# Patient Record
Sex: Male | Born: 1960
Health system: Southern US, Community
[De-identification: ages and names within clinical notes are randomized; demographics above are authoritative.]

## PROBLEM LIST (undated history)

## (undated) DIAGNOSIS — R6521 Severe sepsis with septic shock: Secondary | ICD-10-CM

## (undated) DIAGNOSIS — J9601 Acute respiratory failure with hypoxia: Secondary | ICD-10-CM

## (undated) DIAGNOSIS — I420 Dilated cardiomyopathy: Secondary | ICD-10-CM

## (undated) DIAGNOSIS — Z7189 Other specified counseling: Secondary | ICD-10-CM

## (undated) DIAGNOSIS — J45909 Unspecified asthma, uncomplicated: Secondary | ICD-10-CM

## (undated) DIAGNOSIS — I4891 Unspecified atrial fibrillation: Secondary | ICD-10-CM

## (undated) DIAGNOSIS — I351 Nonrheumatic aortic (valve) insufficiency: Secondary | ICD-10-CM

## (undated) DIAGNOSIS — D696 Thrombocytopenia, unspecified: Secondary | ICD-10-CM

## (undated) DIAGNOSIS — K219 Gastro-esophageal reflux disease without esophagitis: Secondary | ICD-10-CM

## (undated) DIAGNOSIS — E785 Hyperlipidemia, unspecified: Secondary | ICD-10-CM

## (undated) DIAGNOSIS — E1169 Type 2 diabetes mellitus with other specified complication: Secondary | ICD-10-CM

## (undated) DIAGNOSIS — J90 Pleural effusion, not elsewhere classified: Secondary | ICD-10-CM

## (undated) DIAGNOSIS — I10 Essential (primary) hypertension: Secondary | ICD-10-CM

## (undated) DIAGNOSIS — I509 Heart failure, unspecified: Secondary | ICD-10-CM

## (undated) DIAGNOSIS — N184 Chronic kidney disease, stage 4 (severe): Secondary | ICD-10-CM

## (undated) DIAGNOSIS — Z992 Dependence on renal dialysis: Secondary | ICD-10-CM

## (undated) DIAGNOSIS — G9340 Encephalopathy, unspecified: Secondary | ICD-10-CM

## (undated) DIAGNOSIS — E669 Obesity, unspecified: Secondary | ICD-10-CM

## (undated) DIAGNOSIS — E8770 Fluid overload, unspecified: Secondary | ICD-10-CM

## (undated) DIAGNOSIS — Z953 Presence of xenogenic heart valve: Secondary | ICD-10-CM

## (undated) DIAGNOSIS — A084 Viral intestinal infection, unspecified: Secondary | ICD-10-CM

## (undated) DIAGNOSIS — I2510 Atherosclerotic heart disease of native coronary artery without angina pectoris: Secondary | ICD-10-CM

## (undated) DIAGNOSIS — I4821 Permanent atrial fibrillation: Secondary | ICD-10-CM

## (undated) DIAGNOSIS — R109 Unspecified abdominal pain: Secondary | ICD-10-CM

## (undated) DIAGNOSIS — D638 Anemia in other chronic diseases classified elsewhere: Secondary | ICD-10-CM

## (undated) DIAGNOSIS — G4733 Obstructive sleep apnea (adult) (pediatric): Secondary | ICD-10-CM

## (undated) DIAGNOSIS — K859 Acute pancreatitis without necrosis or infection, unspecified: Secondary | ICD-10-CM

## (undated) DIAGNOSIS — R7989 Other specified abnormal findings of blood chemistry: Secondary | ICD-10-CM

## (undated) DIAGNOSIS — E875 Hyperkalemia: Secondary | ICD-10-CM

## (undated) DIAGNOSIS — K7460 Unspecified cirrhosis of liver: Secondary | ICD-10-CM

## (undated) DIAGNOSIS — R0600 Dyspnea, unspecified: Secondary | ICD-10-CM

## (undated) DIAGNOSIS — D649 Anemia, unspecified: Secondary | ICD-10-CM

## (undated) DIAGNOSIS — M109 Gout, unspecified: Secondary | ICD-10-CM

## (undated) DIAGNOSIS — A419 Sepsis, unspecified organism: Secondary | ICD-10-CM

## (undated) DIAGNOSIS — Z79899 Other long term (current) drug therapy: Secondary | ICD-10-CM

## (undated) DIAGNOSIS — E119 Type 2 diabetes mellitus without complications: Secondary | ICD-10-CM

## (undated) DIAGNOSIS — Z9289 Personal history of other medical treatment: Secondary | ICD-10-CM

## (undated) DIAGNOSIS — R55 Syncope and collapse: Secondary | ICD-10-CM

## (undated) DIAGNOSIS — Z9889 Other specified postprocedural states: Secondary | ICD-10-CM

## (undated) DIAGNOSIS — M79669 Pain in unspecified lower leg: Secondary | ICD-10-CM

## (undated) DIAGNOSIS — I480 Paroxysmal atrial fibrillation: Secondary | ICD-10-CM

## (undated) DIAGNOSIS — N186 End stage renal disease: Secondary | ICD-10-CM

## (undated) DIAGNOSIS — I5022 Chronic systolic (congestive) heart failure: Secondary | ICD-10-CM

## (undated) DIAGNOSIS — I272 Pulmonary hypertension, unspecified: Secondary | ICD-10-CM

## (undated) DIAGNOSIS — I059 Rheumatic mitral valve disease, unspecified: Secondary | ICD-10-CM

## (undated) HISTORY — DX: Other specified counseling: Z71.89

## (undated) HISTORY — DX: Sepsis, unspecified organism: A41.9

## (undated) HISTORY — DX: Dilated cardiomyopathy: I42.0

## (undated) HISTORY — DX: Pain in unspecified lower leg: M79.669

## (undated) HISTORY — DX: Heart failure, unspecified: I50.9

## (undated) HISTORY — DX: Unspecified abdominal pain: R10.9

## (undated) HISTORY — DX: Fluid overload, unspecified: E87.70

## (undated) HISTORY — DX: Sepsis, unspecified organism: R65.21

## (undated) HISTORY — DX: Acute pancreatitis without necrosis or infection, unspecified: K85.90

## (undated) HISTORY — DX: Encephalopathy, unspecified: G93.40

## (undated) HISTORY — DX: Gout, unspecified: M10.9

## (undated) HISTORY — DX: Hyperlipidemia, unspecified: E78.5

## (undated) HISTORY — DX: Anemia, unspecified: D64.9

## (undated) HISTORY — DX: Permanent atrial fibrillation: I48.21

## (undated) HISTORY — DX: Rheumatic mitral valve disease, unspecified: I05.9

## (undated) HISTORY — DX: Gastro-esophageal reflux disease without esophagitis: K21.9

## (undated) HISTORY — DX: Dependence on renal dialysis: Z99.2

## (undated) HISTORY — DX: Nonrheumatic aortic (valve) insufficiency: I35.1

## (undated) HISTORY — DX: Hyperkalemia: E87.5

## (undated) HISTORY — DX: Other specified abnormal findings of blood chemistry: R79.89

## (undated) HISTORY — PX: CARDIAC SURGERY: SHX584

## (undated) HISTORY — DX: End stage renal disease: N18.6

## (undated) HISTORY — DX: Unspecified atrial fibrillation: I48.91

## (undated) HISTORY — DX: Personal history of other medical treatment: Z92.89

## (undated) HISTORY — DX: Acute respiratory failure with hypoxia: J96.01

## (undated) HISTORY — DX: Viral intestinal infection, unspecified: A08.4

## (undated) HISTORY — DX: Unspecified asthma, uncomplicated: J45.909

## (undated) HISTORY — DX: Chronic systolic (congestive) heart failure: I50.22

## (undated) HISTORY — DX: Other long term (current) drug therapy: Z79.899

## (undated) HISTORY — DX: Syncope and collapse: R55

## (undated) HISTORY — DX: Pleural effusion, not elsewhere classified: J90

---

## 1998-06-29 ENCOUNTER — Ambulatory Visit: Admission: RE | Admit: 1998-06-29 | Discharge: 1998-06-29 | Payer: Self-pay | Admitting: Otolaryngology

## 2009-11-17 HISTORY — PX: CARDIAC VALVE REPLACEMENT: SHX585

## 2010-11-17 DIAGNOSIS — Z953 Presence of xenogenic heart valve: Secondary | ICD-10-CM

## 2010-11-17 DIAGNOSIS — Z9889 Other specified postprocedural states: Secondary | ICD-10-CM

## 2010-11-17 HISTORY — DX: Presence of xenogenic heart valve: Z95.3

## 2010-11-17 HISTORY — DX: Other specified postprocedural states: Z98.890

## 2014-01-04 DIAGNOSIS — R7989 Other specified abnormal findings of blood chemistry: Secondary | ICD-10-CM | POA: Diagnosis not present

## 2014-01-04 DIAGNOSIS — N189 Chronic kidney disease, unspecified: Secondary | ICD-10-CM | POA: Diagnosis not present

## 2014-01-04 DIAGNOSIS — I4891 Unspecified atrial fibrillation: Secondary | ICD-10-CM | POA: Diagnosis not present

## 2014-01-04 DIAGNOSIS — IMO0001 Reserved for inherently not codable concepts without codable children: Secondary | ICD-10-CM | POA: Diagnosis not present

## 2014-01-18 DIAGNOSIS — M1990 Unspecified osteoarthritis, unspecified site: Secondary | ICD-10-CM | POA: Diagnosis not present

## 2014-01-18 DIAGNOSIS — N189 Chronic kidney disease, unspecified: Secondary | ICD-10-CM | POA: Diagnosis not present

## 2014-01-18 DIAGNOSIS — I4891 Unspecified atrial fibrillation: Secondary | ICD-10-CM | POA: Diagnosis not present

## 2014-01-18 DIAGNOSIS — IMO0001 Reserved for inherently not codable concepts without codable children: Secondary | ICD-10-CM | POA: Diagnosis not present

## 2014-01-19 DIAGNOSIS — H2510 Age-related nuclear cataract, unspecified eye: Secondary | ICD-10-CM | POA: Diagnosis not present

## 2014-01-19 DIAGNOSIS — E119 Type 2 diabetes mellitus without complications: Secondary | ICD-10-CM | POA: Diagnosis not present

## 2014-01-27 DIAGNOSIS — I4891 Unspecified atrial fibrillation: Secondary | ICD-10-CM | POA: Diagnosis not present

## 2014-01-27 DIAGNOSIS — Z954 Presence of other heart-valve replacement: Secondary | ICD-10-CM | POA: Diagnosis not present

## 2014-03-02 DIAGNOSIS — Z8679 Personal history of other diseases of the circulatory system: Secondary | ICD-10-CM | POA: Diagnosis not present

## 2014-03-23 DIAGNOSIS — I059 Rheumatic mitral valve disease, unspecified: Secondary | ICD-10-CM | POA: Diagnosis not present

## 2014-04-11 DIAGNOSIS — Z8679 Personal history of other diseases of the circulatory system: Secondary | ICD-10-CM | POA: Diagnosis not present

## 2014-04-11 DIAGNOSIS — Z7901 Long term (current) use of anticoagulants: Secondary | ICD-10-CM | POA: Diagnosis not present

## 2014-04-11 DIAGNOSIS — I509 Heart failure, unspecified: Secondary | ICD-10-CM | POA: Diagnosis not present

## 2014-04-13 DIAGNOSIS — E119 Type 2 diabetes mellitus without complications: Secondary | ICD-10-CM | POA: Diagnosis not present

## 2014-04-13 DIAGNOSIS — I10 Essential (primary) hypertension: Secondary | ICD-10-CM | POA: Diagnosis not present

## 2014-04-13 DIAGNOSIS — Z125 Encounter for screening for malignant neoplasm of prostate: Secondary | ICD-10-CM | POA: Diagnosis not present

## 2014-04-13 DIAGNOSIS — Z0000 Encounter for general adult medical examination without abnormal findings: Secondary | ICD-10-CM | POA: Diagnosis not present

## 2014-05-11 DIAGNOSIS — N189 Chronic kidney disease, unspecified: Secondary | ICD-10-CM | POA: Diagnosis not present

## 2014-05-11 DIAGNOSIS — IMO0001 Reserved for inherently not codable concepts without codable children: Secondary | ICD-10-CM | POA: Diagnosis not present

## 2014-07-28 DIAGNOSIS — E119 Type 2 diabetes mellitus without complications: Secondary | ICD-10-CM | POA: Diagnosis not present

## 2014-08-04 DIAGNOSIS — N189 Chronic kidney disease, unspecified: Secondary | ICD-10-CM | POA: Diagnosis not present

## 2014-08-04 DIAGNOSIS — I4891 Unspecified atrial fibrillation: Secondary | ICD-10-CM | POA: Diagnosis not present

## 2014-08-04 DIAGNOSIS — IMO0001 Reserved for inherently not codable concepts without codable children: Secondary | ICD-10-CM | POA: Diagnosis not present

## 2014-08-04 DIAGNOSIS — I10 Essential (primary) hypertension: Secondary | ICD-10-CM | POA: Diagnosis not present

## 2014-08-21 DIAGNOSIS — I10 Essential (primary) hypertension: Secondary | ICD-10-CM | POA: Diagnosis not present

## 2014-08-21 DIAGNOSIS — N181 Chronic kidney disease, stage 1: Secondary | ICD-10-CM | POA: Diagnosis not present

## 2014-08-21 DIAGNOSIS — Z8679 Personal history of other diseases of the circulatory system: Secondary | ICD-10-CM | POA: Diagnosis not present

## 2014-09-06 DIAGNOSIS — Z954 Presence of other heart-valve replacement: Secondary | ICD-10-CM | POA: Diagnosis not present

## 2014-09-06 DIAGNOSIS — I480 Paroxysmal atrial fibrillation: Secondary | ICD-10-CM | POA: Diagnosis not present

## 2014-09-06 DIAGNOSIS — I10 Essential (primary) hypertension: Secondary | ICD-10-CM | POA: Diagnosis not present

## 2014-09-06 DIAGNOSIS — I059 Rheumatic mitral valve disease, unspecified: Secondary | ICD-10-CM | POA: Diagnosis not present

## 2014-09-11 DIAGNOSIS — Z7901 Long term (current) use of anticoagulants: Secondary | ICD-10-CM | POA: Diagnosis not present

## 2014-09-11 DIAGNOSIS — E782 Mixed hyperlipidemia: Secondary | ICD-10-CM | POA: Diagnosis not present

## 2014-09-11 DIAGNOSIS — Z8679 Personal history of other diseases of the circulatory system: Secondary | ICD-10-CM | POA: Diagnosis not present

## 2014-09-11 DIAGNOSIS — E119 Type 2 diabetes mellitus without complications: Secondary | ICD-10-CM | POA: Diagnosis not present

## 2014-09-14 DIAGNOSIS — I480 Paroxysmal atrial fibrillation: Secondary | ICD-10-CM | POA: Diagnosis not present

## 2014-09-14 DIAGNOSIS — Z952 Presence of prosthetic heart valve: Secondary | ICD-10-CM | POA: Diagnosis not present

## 2014-09-14 DIAGNOSIS — I10 Essential (primary) hypertension: Secondary | ICD-10-CM | POA: Diagnosis not present

## 2014-10-23 DIAGNOSIS — Z7901 Long term (current) use of anticoagulants: Secondary | ICD-10-CM | POA: Diagnosis not present

## 2014-10-23 DIAGNOSIS — E782 Mixed hyperlipidemia: Secondary | ICD-10-CM | POA: Diagnosis not present

## 2014-10-23 DIAGNOSIS — E119 Type 2 diabetes mellitus without complications: Secondary | ICD-10-CM | POA: Diagnosis not present

## 2014-10-23 DIAGNOSIS — Z8679 Personal history of other diseases of the circulatory system: Secondary | ICD-10-CM | POA: Diagnosis not present

## 2014-10-31 DIAGNOSIS — I10 Essential (primary) hypertension: Secondary | ICD-10-CM | POA: Diagnosis not present

## 2014-10-31 DIAGNOSIS — Z8679 Personal history of other diseases of the circulatory system: Secondary | ICD-10-CM | POA: Diagnosis not present

## 2014-10-31 DIAGNOSIS — Z7901 Long term (current) use of anticoagulants: Secondary | ICD-10-CM | POA: Diagnosis not present

## 2014-10-31 DIAGNOSIS — N181 Chronic kidney disease, stage 1: Secondary | ICD-10-CM | POA: Diagnosis not present

## 2014-11-28 DIAGNOSIS — Z8679 Personal history of other diseases of the circulatory system: Secondary | ICD-10-CM | POA: Diagnosis not present

## 2014-11-28 DIAGNOSIS — N189 Chronic kidney disease, unspecified: Secondary | ICD-10-CM | POA: Diagnosis not present

## 2014-11-28 DIAGNOSIS — E119 Type 2 diabetes mellitus without complications: Secondary | ICD-10-CM | POA: Diagnosis not present

## 2014-12-08 DIAGNOSIS — R05 Cough: Secondary | ICD-10-CM | POA: Diagnosis not present

## 2014-12-08 DIAGNOSIS — J181 Lobar pneumonia, unspecified organism: Secondary | ICD-10-CM | POA: Diagnosis not present

## 2014-12-08 DIAGNOSIS — R509 Fever, unspecified: Secondary | ICD-10-CM | POA: Diagnosis not present

## 2014-12-08 DIAGNOSIS — R000 Tachycardia, unspecified: Secondary | ICD-10-CM | POA: Diagnosis not present

## 2014-12-08 DIAGNOSIS — I10 Essential (primary) hypertension: Secondary | ICD-10-CM | POA: Diagnosis not present

## 2014-12-08 DIAGNOSIS — J9811 Atelectasis: Secondary | ICD-10-CM | POA: Diagnosis not present

## 2014-12-08 DIAGNOSIS — R0602 Shortness of breath: Secondary | ICD-10-CM | POA: Diagnosis not present

## 2014-12-09 DIAGNOSIS — R0602 Shortness of breath: Secondary | ICD-10-CM | POA: Diagnosis not present

## 2014-12-09 DIAGNOSIS — R05 Cough: Secondary | ICD-10-CM | POA: Diagnosis not present

## 2014-12-10 DIAGNOSIS — I10 Essential (primary) hypertension: Secondary | ICD-10-CM | POA: Diagnosis not present

## 2014-12-10 DIAGNOSIS — I071 Rheumatic tricuspid insufficiency: Secondary | ICD-10-CM | POA: Diagnosis not present

## 2014-12-10 DIAGNOSIS — I5033 Acute on chronic diastolic (congestive) heart failure: Secondary | ICD-10-CM | POA: Diagnosis not present

## 2014-12-11 DIAGNOSIS — I509 Heart failure, unspecified: Secondary | ICD-10-CM | POA: Diagnosis not present

## 2014-12-11 DIAGNOSIS — R609 Edema, unspecified: Secondary | ICD-10-CM | POA: Diagnosis not present

## 2014-12-18 DIAGNOSIS — J440 Chronic obstructive pulmonary disease with acute lower respiratory infection: Secondary | ICD-10-CM | POA: Diagnosis not present

## 2014-12-20 DIAGNOSIS — I10 Essential (primary) hypertension: Secondary | ICD-10-CM | POA: Diagnosis not present

## 2014-12-20 DIAGNOSIS — E119 Type 2 diabetes mellitus without complications: Secondary | ICD-10-CM | POA: Diagnosis not present

## 2014-12-20 DIAGNOSIS — I270 Primary pulmonary hypertension: Secondary | ICD-10-CM | POA: Diagnosis not present

## 2015-01-05 DIAGNOSIS — Z954 Presence of other heart-valve replacement: Secondary | ICD-10-CM | POA: Diagnosis not present

## 2015-01-05 DIAGNOSIS — E119 Type 2 diabetes mellitus without complications: Secondary | ICD-10-CM | POA: Diagnosis not present

## 2015-01-05 DIAGNOSIS — I509 Heart failure, unspecified: Secondary | ICD-10-CM | POA: Diagnosis not present

## 2015-01-05 DIAGNOSIS — M5136 Other intervertebral disc degeneration, lumbar region: Secondary | ICD-10-CM | POA: Diagnosis not present

## 2015-02-02 DIAGNOSIS — D649 Anemia, unspecified: Secondary | ICD-10-CM | POA: Diagnosis not present

## 2015-02-02 DIAGNOSIS — R7989 Other specified abnormal findings of blood chemistry: Secondary | ICD-10-CM | POA: Diagnosis not present

## 2015-02-02 DIAGNOSIS — E119 Type 2 diabetes mellitus without complications: Secondary | ICD-10-CM | POA: Diagnosis not present

## 2015-02-02 DIAGNOSIS — M545 Low back pain: Secondary | ICD-10-CM | POA: Diagnosis not present

## 2015-03-14 DIAGNOSIS — N181 Chronic kidney disease, stage 1: Secondary | ICD-10-CM | POA: Diagnosis not present

## 2015-03-14 DIAGNOSIS — I10 Essential (primary) hypertension: Secondary | ICD-10-CM | POA: Diagnosis not present

## 2015-03-14 DIAGNOSIS — E119 Type 2 diabetes mellitus without complications: Secondary | ICD-10-CM | POA: Diagnosis not present

## 2015-04-11 DIAGNOSIS — D649 Anemia, unspecified: Secondary | ICD-10-CM | POA: Diagnosis not present

## 2015-04-11 DIAGNOSIS — Z7901 Long term (current) use of anticoagulants: Secondary | ICD-10-CM | POA: Diagnosis not present

## 2015-04-11 DIAGNOSIS — I10 Essential (primary) hypertension: Secondary | ICD-10-CM | POA: Diagnosis not present

## 2015-04-11 DIAGNOSIS — E785 Hyperlipidemia, unspecified: Secondary | ICD-10-CM | POA: Diagnosis not present

## 2015-04-11 DIAGNOSIS — E119 Type 2 diabetes mellitus without complications: Secondary | ICD-10-CM | POA: Diagnosis not present

## 2015-04-11 DIAGNOSIS — E559 Vitamin D deficiency, unspecified: Secondary | ICD-10-CM | POA: Diagnosis not present

## 2015-04-20 DIAGNOSIS — I10 Essential (primary) hypertension: Secondary | ICD-10-CM | POA: Diagnosis not present

## 2015-04-20 DIAGNOSIS — Z7901 Long term (current) use of anticoagulants: Secondary | ICD-10-CM | POA: Diagnosis not present

## 2015-04-20 DIAGNOSIS — E119 Type 2 diabetes mellitus without complications: Secondary | ICD-10-CM | POA: Diagnosis not present

## 2015-05-07 DIAGNOSIS — Z952 Presence of prosthetic heart valve: Secondary | ICD-10-CM | POA: Diagnosis not present

## 2015-05-07 DIAGNOSIS — Z794 Long term (current) use of insulin: Secondary | ICD-10-CM | POA: Diagnosis not present

## 2015-05-07 DIAGNOSIS — D689 Coagulation defect, unspecified: Secondary | ICD-10-CM | POA: Diagnosis not present

## 2015-05-07 DIAGNOSIS — E860 Dehydration: Secondary | ICD-10-CM | POA: Diagnosis not present

## 2015-05-07 DIAGNOSIS — E782 Mixed hyperlipidemia: Secondary | ICD-10-CM | POA: Diagnosis not present

## 2015-05-07 DIAGNOSIS — Z79899 Other long term (current) drug therapy: Secondary | ICD-10-CM | POA: Diagnosis not present

## 2015-05-07 DIAGNOSIS — J449 Chronic obstructive pulmonary disease, unspecified: Secondary | ICD-10-CM | POA: Diagnosis not present

## 2015-05-07 DIAGNOSIS — R079 Chest pain, unspecified: Secondary | ICD-10-CM | POA: Diagnosis not present

## 2015-05-07 DIAGNOSIS — I340 Nonrheumatic mitral (valve) insufficiency: Secondary | ICD-10-CM | POA: Diagnosis not present

## 2015-05-07 DIAGNOSIS — M109 Gout, unspecified: Secondary | ICD-10-CM | POA: Diagnosis not present

## 2015-05-07 DIAGNOSIS — I809 Phlebitis and thrombophlebitis of unspecified site: Secondary | ICD-10-CM | POA: Diagnosis not present

## 2015-05-07 DIAGNOSIS — I480 Paroxysmal atrial fibrillation: Secondary | ICD-10-CM | POA: Diagnosis not present

## 2015-05-07 DIAGNOSIS — E119 Type 2 diabetes mellitus without complications: Secondary | ICD-10-CM | POA: Diagnosis not present

## 2015-05-07 DIAGNOSIS — Z7901 Long term (current) use of anticoagulants: Secondary | ICD-10-CM | POA: Diagnosis not present

## 2015-05-07 DIAGNOSIS — G4731 Primary central sleep apnea: Secondary | ICD-10-CM | POA: Diagnosis not present

## 2015-05-07 DIAGNOSIS — M25532 Pain in left wrist: Secondary | ICD-10-CM | POA: Diagnosis not present

## 2015-05-07 DIAGNOSIS — N189 Chronic kidney disease, unspecified: Secondary | ICD-10-CM | POA: Diagnosis not present

## 2015-05-07 DIAGNOSIS — I129 Hypertensive chronic kidney disease with stage 1 through stage 4 chronic kidney disease, or unspecified chronic kidney disease: Secondary | ICD-10-CM | POA: Diagnosis not present

## 2015-05-07 DIAGNOSIS — G8929 Other chronic pain: Secondary | ICD-10-CM | POA: Diagnosis not present

## 2015-05-08 DIAGNOSIS — R079 Chest pain, unspecified: Secondary | ICD-10-CM | POA: Diagnosis not present

## 2015-05-18 DIAGNOSIS — M25532 Pain in left wrist: Secondary | ICD-10-CM | POA: Diagnosis not present

## 2015-06-23 DIAGNOSIS — Z23 Encounter for immunization: Secondary | ICD-10-CM | POA: Diagnosis not present

## 2015-06-29 DIAGNOSIS — M79641 Pain in right hand: Secondary | ICD-10-CM | POA: Diagnosis not present

## 2015-06-29 DIAGNOSIS — D649 Anemia, unspecified: Secondary | ICD-10-CM | POA: Diagnosis not present

## 2015-06-29 DIAGNOSIS — E119 Type 2 diabetes mellitus without complications: Secondary | ICD-10-CM | POA: Diagnosis not present

## 2015-06-29 DIAGNOSIS — Z7901 Long term (current) use of anticoagulants: Secondary | ICD-10-CM | POA: Diagnosis not present

## 2015-08-22 DIAGNOSIS — E119 Type 2 diabetes mellitus without complications: Secondary | ICD-10-CM | POA: Diagnosis not present

## 2015-08-22 DIAGNOSIS — M25579 Pain in unspecified ankle and joints of unspecified foot: Secondary | ICD-10-CM | POA: Diagnosis not present

## 2015-08-22 DIAGNOSIS — I10 Essential (primary) hypertension: Secondary | ICD-10-CM | POA: Diagnosis not present

## 2015-08-22 DIAGNOSIS — R5382 Chronic fatigue, unspecified: Secondary | ICD-10-CM | POA: Diagnosis not present

## 2015-08-22 DIAGNOSIS — E785 Hyperlipidemia, unspecified: Secondary | ICD-10-CM | POA: Diagnosis not present

## 2015-08-22 DIAGNOSIS — I499 Cardiac arrhythmia, unspecified: Secondary | ICD-10-CM | POA: Diagnosis not present

## 2015-08-22 DIAGNOSIS — E669 Obesity, unspecified: Secondary | ICD-10-CM | POA: Diagnosis not present

## 2015-08-27 ENCOUNTER — Telehealth: Payer: Self-pay | Admitting: Cardiovascular Disease

## 2015-08-27 NOTE — Telephone Encounter (Signed)
Received records from [facility] for appointment on 09/11/15 with Dr Oval Linsey.  Records given to [facility] (medical records) for Dr Randolm Idol schedule on 09/11/15. lp

## 2015-09-10 ENCOUNTER — Inpatient Hospital Stay (HOSPITAL_COMMUNITY)
Admission: EM | Admit: 2015-09-10 | Discharge: 2015-09-14 | DRG: 308 | Disposition: A | Discharge status: Home / Self Care | Payer: Medicare Other | Attending: Internal Medicine | Admitting: Internal Medicine

## 2015-09-10 ENCOUNTER — Encounter (HOSPITAL_COMMUNITY): Payer: Self-pay | Admitting: *Deleted

## 2015-09-10 ENCOUNTER — Emergency Department (HOSPITAL_COMMUNITY): Payer: Medicare Other

## 2015-09-10 DIAGNOSIS — Z6837 Body mass index (BMI) 37.0-37.9, adult: Secondary | ICD-10-CM

## 2015-09-10 DIAGNOSIS — D509 Iron deficiency anemia, unspecified: Secondary | ICD-10-CM | POA: Diagnosis present

## 2015-09-10 DIAGNOSIS — N189 Chronic kidney disease, unspecified: Secondary | ICD-10-CM

## 2015-09-10 DIAGNOSIS — I959 Hypotension, unspecified: Secondary | ICD-10-CM | POA: Diagnosis present

## 2015-09-10 DIAGNOSIS — G4733 Obstructive sleep apnea (adult) (pediatric): Secondary | ICD-10-CM | POA: Diagnosis present

## 2015-09-10 DIAGNOSIS — R1900 Intra-abdominal and pelvic swelling, mass and lump, unspecified site: Secondary | ICD-10-CM | POA: Diagnosis present

## 2015-09-10 DIAGNOSIS — R0600 Dyspnea, unspecified: Secondary | ICD-10-CM

## 2015-09-10 DIAGNOSIS — I481 Persistent atrial fibrillation: Secondary | ICD-10-CM | POA: Diagnosis not present

## 2015-09-10 DIAGNOSIS — M79672 Pain in left foot: Secondary | ICD-10-CM | POA: Diagnosis present

## 2015-09-10 DIAGNOSIS — E1122 Type 2 diabetes mellitus with diabetic chronic kidney disease: Secondary | ICD-10-CM

## 2015-09-10 DIAGNOSIS — I272 Other secondary pulmonary hypertension: Secondary | ICD-10-CM | POA: Diagnosis present

## 2015-09-10 DIAGNOSIS — I4891 Unspecified atrial fibrillation: Secondary | ICD-10-CM | POA: Diagnosis not present

## 2015-09-10 DIAGNOSIS — I5043 Acute on chronic combined systolic (congestive) and diastolic (congestive) heart failure: Secondary | ICD-10-CM | POA: Insufficient documentation

## 2015-09-10 DIAGNOSIS — I10 Essential (primary) hypertension: Secondary | ICD-10-CM

## 2015-09-10 DIAGNOSIS — I129 Hypertensive chronic kidney disease with stage 1 through stage 4 chronic kidney disease, or unspecified chronic kidney disease: Secondary | ICD-10-CM | POA: Diagnosis present

## 2015-09-10 DIAGNOSIS — Z951 Presence of aortocoronary bypass graft: Secondary | ICD-10-CM

## 2015-09-10 DIAGNOSIS — R0602 Shortness of breath: Secondary | ICD-10-CM

## 2015-09-10 DIAGNOSIS — F17200 Nicotine dependence, unspecified, uncomplicated: Secondary | ICD-10-CM | POA: Diagnosis present

## 2015-09-10 DIAGNOSIS — M79662 Pain in left lower leg: Secondary | ICD-10-CM

## 2015-09-10 DIAGNOSIS — E119 Type 2 diabetes mellitus without complications: Secondary | ICD-10-CM | POA: Diagnosis not present

## 2015-09-10 DIAGNOSIS — I5023 Acute on chronic systolic (congestive) heart failure: Secondary | ICD-10-CM | POA: Diagnosis not present

## 2015-09-10 DIAGNOSIS — E1165 Type 2 diabetes mellitus with hyperglycemia: Secondary | ICD-10-CM | POA: Diagnosis present

## 2015-09-10 DIAGNOSIS — I2510 Atherosclerotic heart disease of native coronary artery without angina pectoris: Secondary | ICD-10-CM | POA: Diagnosis present

## 2015-09-10 DIAGNOSIS — Z79899 Other long term (current) drug therapy: Secondary | ICD-10-CM | POA: Diagnosis not present

## 2015-09-10 DIAGNOSIS — E1129 Type 2 diabetes mellitus with other diabetic kidney complication: Secondary | ICD-10-CM

## 2015-09-10 DIAGNOSIS — Z952 Presence of prosthetic heart valve: Secondary | ICD-10-CM | POA: Diagnosis not present

## 2015-09-10 DIAGNOSIS — D638 Anemia in other chronic diseases classified elsewhere: Secondary | ICD-10-CM | POA: Diagnosis present

## 2015-09-10 DIAGNOSIS — M79669 Pain in unspecified lower leg: Secondary | ICD-10-CM | POA: Diagnosis not present

## 2015-09-10 DIAGNOSIS — Z9119 Patient's noncompliance with other medical treatment and regimen: Secondary | ICD-10-CM

## 2015-09-10 DIAGNOSIS — Z794 Long term (current) use of insulin: Secondary | ICD-10-CM

## 2015-09-10 DIAGNOSIS — N184 Chronic kidney disease, stage 4 (severe): Secondary | ICD-10-CM | POA: Diagnosis not present

## 2015-09-10 DIAGNOSIS — I059 Rheumatic mitral valve disease, unspecified: Secondary | ICD-10-CM | POA: Diagnosis not present

## 2015-09-10 DIAGNOSIS — E669 Obesity, unspecified: Secondary | ICD-10-CM | POA: Diagnosis present

## 2015-09-10 DIAGNOSIS — R0789 Other chest pain: Secondary | ICD-10-CM | POA: Diagnosis not present

## 2015-09-10 HISTORY — DX: Atherosclerotic heart disease of native coronary artery without angina pectoris: I25.10

## 2015-09-10 HISTORY — DX: Paroxysmal atrial fibrillation: I48.0

## 2015-09-10 HISTORY — DX: Pulmonary hypertension, unspecified: I27.20

## 2015-09-10 HISTORY — DX: Pain in unspecified lower leg: M79.669

## 2015-09-10 HISTORY — DX: Other specified postprocedural states: Z98.890

## 2015-09-10 HISTORY — DX: Essential (primary) hypertension: I10

## 2015-09-10 HISTORY — DX: Type 2 diabetes mellitus with other specified complication: E11.69

## 2015-09-10 HISTORY — DX: Obesity, unspecified: E66.9

## 2015-09-10 HISTORY — DX: Presence of xenogenic heart valve: Z95.3

## 2015-09-10 HISTORY — DX: Type 2 diabetes mellitus without complications: E11.9

## 2015-09-10 HISTORY — DX: Anemia in other chronic diseases classified elsewhere: D63.8

## 2015-09-10 HISTORY — DX: Chronic kidney disease, stage 4 (severe): N18.4

## 2015-09-10 HISTORY — DX: Obstructive sleep apnea (adult) (pediatric): G47.33

## 2015-09-10 LAB — CBC
HCT: 31.8 % — ABNORMAL LOW (ref 39.0–52.0)
HEMOGLOBIN: 10.4 g/dL — AB (ref 13.0–17.0)
MCH: 24.2 pg — ABNORMAL LOW (ref 26.0–34.0)
MCHC: 32.7 g/dL (ref 30.0–36.0)
MCV: 74.1 fL — ABNORMAL LOW (ref 78.0–100.0)
Platelets: 203 10*3/uL (ref 150–400)
RBC: 4.29 MIL/uL (ref 4.22–5.81)
RDW: 16.2 % — AB (ref 11.5–15.5)
WBC: 8 10*3/uL (ref 4.0–10.5)

## 2015-09-10 LAB — I-STAT TROPONIN, ED: TROPONIN I, POC: 0.01 ng/mL (ref 0.00–0.08)

## 2015-09-10 LAB — BASIC METABOLIC PANEL
ANION GAP: 8 (ref 5–15)
BUN: 67 mg/dL — AB (ref 6–20)
CALCIUM: 8.9 mg/dL (ref 8.9–10.3)
CHLORIDE: 108 mmol/L (ref 101–111)
CO2: 21 mmol/L — ABNORMAL LOW (ref 22–32)
Creatinine, Ser: 3.38 mg/dL — ABNORMAL HIGH (ref 0.61–1.24)
GFR, EST AFRICAN AMERICAN: 22 mL/min — AB (ref >60)
GFR, EST NON AFRICAN AMERICAN: 19 mL/min — AB (ref >60)
GLUCOSE: 183 mg/dL — AB (ref 65–99)
POTASSIUM: 5.1 mmol/L (ref 3.5–5.1)
SODIUM: 137 mmol/L (ref 135–145)

## 2015-09-10 LAB — GLUCOSE, CAPILLARY
GLUCOSE-CAPILLARY: 149 mg/dL — AB (ref 65–99)
GLUCOSE-CAPILLARY: 93 mg/dL (ref 65–99)

## 2015-09-10 LAB — D-DIMER, QUANTITATIVE (NOT AT ARMC): D DIMER QUANT: 0.99 ug{FEU}/mL — AB (ref 0.00–0.48)

## 2015-09-10 MED ORDER — LISINOPRIL 10 MG PO TABS
10.0000 mg | ORAL_TABLET | Freq: Every day | ORAL | Status: DC
  Filled 2015-09-10: qty 1

## 2015-09-10 MED ORDER — FUROSEMIDE 40 MG PO TABS
40.0000 mg | ORAL_TABLET | Freq: Every day | ORAL | Status: DC
  Administered 2015-09-12 – 2015-09-13 (×2): 40 mg via ORAL
  Filled 2015-09-10 (×3): qty 1

## 2015-09-10 MED ORDER — HEPARIN BOLUS VIA INFUSION
3000.0000 [IU] | Freq: Once | INTRAVENOUS | Status: AC
  Administered 2015-09-10: 3000 [IU] via INTRAVENOUS
  Filled 2015-09-10: qty 3000

## 2015-09-10 MED ORDER — DILTIAZEM HCL 100 MG IV SOLR: 5.0000 mg/h | INTRAVENOUS | Status: DC

## 2015-09-10 MED ORDER — CARVEDILOL 25 MG PO TABS
25.0000 mg | ORAL_TABLET | Freq: Two times a day (BID) | ORAL | Status: DC
  Administered 2015-09-11 – 2015-09-12 (×3): 25 mg via ORAL
  Filled 2015-09-10 (×3): qty 1

## 2015-09-10 MED ORDER — SODIUM CHLORIDE 0.9 % IJ SOLN
3.0000 mL | Freq: Two times a day (BID) | INTRAMUSCULAR | Status: DC
  Administered 2015-09-10 – 2015-09-14 (×5): 3 mL via INTRAVENOUS

## 2015-09-10 MED ORDER — DILTIAZEM LOAD VIA INFUSION
15.0000 mg | Freq: Once | INTRAVENOUS | Status: AC
  Administered 2015-09-10: 15 mg via INTRAVENOUS
  Filled 2015-09-10: qty 15

## 2015-09-10 MED ORDER — ACETAMINOPHEN 325 MG PO TABS
650.0000 mg | ORAL_TABLET | ORAL | Status: DC | PRN
  Administered 2015-09-10 – 2015-09-14 (×2): 650 mg via ORAL
  Filled 2015-09-10 (×2): qty 2

## 2015-09-10 MED ORDER — ONDANSETRON HCL 4 MG/2ML IJ SOLN: 4.0000 mg | Freq: Four times a day (QID) | INTRAMUSCULAR | Status: DC | PRN

## 2015-09-10 MED ORDER — SODIUM CHLORIDE 0.9 % IV SOLN
INTRAVENOUS | Status: DC
  Administered 2015-09-10: 1 mL via INTRAVENOUS

## 2015-09-10 MED ORDER — SODIUM CHLORIDE 0.9 % IV SOLN: 250.0000 mL | INTRAVENOUS | Status: DC | PRN

## 2015-09-10 MED ORDER — SODIUM CHLORIDE 0.9 % IJ SOLN: 3.0000 mL | INTRAMUSCULAR | Status: DC | PRN

## 2015-09-10 MED ORDER — INSULIN GLARGINE 100 UNIT/ML ~~LOC~~ SOLN
20.0000 [IU] | Freq: Every day | SUBCUTANEOUS | Status: DC
  Administered 2015-09-10 – 2015-09-13 (×4): 20 [IU] via SUBCUTANEOUS
  Filled 2015-09-10 (×5): qty 0.2

## 2015-09-10 MED ORDER — INSULIN ASPART 100 UNIT/ML ~~LOC~~ SOLN
0.0000 [IU] | Freq: Three times a day (TID) | SUBCUTANEOUS | Status: DC
Start: 2015-09-11 — End: 2015-09-14
  Administered 2015-09-11: 1 [IU] via SUBCUTANEOUS
  Administered 2015-09-11 (×2): 2 [IU] via SUBCUTANEOUS
  Administered 2015-09-12 (×2): 1 [IU] via SUBCUTANEOUS
  Administered 2015-09-13 (×2): 2 [IU] via SUBCUTANEOUS
  Administered 2015-09-14: 1 [IU] via SUBCUTANEOUS
  Administered 2015-09-14: 2 [IU] via SUBCUTANEOUS

## 2015-09-10 MED ORDER — DILTIAZEM LOAD VIA INFUSION: 10.0000 mg | Freq: Once | INTRAVENOUS | Status: DC

## 2015-09-10 MED ORDER — INSULIN ASPART 100 UNIT/ML ~~LOC~~ SOLN: 0.0000 [IU] | Freq: Every day | SUBCUTANEOUS | Status: DC

## 2015-09-10 MED ORDER — HEPARIN (PORCINE) IN NACL 100-0.45 UNIT/ML-% IJ SOLN
1350.0000 [IU]/h | INTRAMUSCULAR | Status: DC
  Administered 2015-09-10: 1350 [IU]/h via INTRAVENOUS
  Filled 2015-09-10 (×2): qty 250

## 2015-09-10 MED ORDER — DEXTROSE 5 % IV SOLN
5.0000 mg/h | INTRAVENOUS | Status: DC
  Administered 2015-09-10 – 2015-09-11 (×2): 5 mg/h via INTRAVENOUS
  Filled 2015-09-10 (×2): qty 100

## 2015-09-10 NOTE — Consult Note (Signed)
ANTICOAGULATION CONSULT NOTE - Initial Consult  Pharmacy Consult for Heparin Indication: atrial fibrillation  No Known Allergies  Patient Measurements: Weight: 212 lb 2 oz (96.219 kg)   Vital Signs: Temp: 97.9 F (36.6 C) (10/24 1658) Temp Source: Oral (10/24 1658) BP: 124/87 mmHg (10/24 2115) Pulse Rate: 59 (10/24 2115)  Labs:  Recent Labs  09/10/15 1727  HGB 10.4*  HCT 31.8*  PLT 203  CREATININE 3.38*    CrCl cannot be calculated (Unknown ideal weight.).   Medical History: Past Medical History  Diagnosis Date  . Coronary artery disease   . Diabetes mellitus without complication ([place])   . Hypertension   . CHF (congestive heart failure) (HCC)     Medications:  No anticoagulants pta  Assessment: 54yom presents to the ED with dyspnea x 2 days. He was found to be in afib ? new onset. CHADSVASC is at least 2. He will begin IV heparin. He is also in renal failure which may be chronic per nephrology.  Goal of Therapy:  Heparin level 0.3-0.7 units/ml Monitor platelets by anticoagulation protocol: Yes   Plan:  1) Heparin bolus 3000 units x 1 2) Heparin drip at 1350 units/hr 3) Check 8 hour heparin level 4) Daily heparin level and CBC  Deboraha Sprang 09/10/2015,9:56 PM

## 2015-09-10 NOTE — ED Notes (Signed)
Family at bedside and updated

## 2015-09-10 NOTE — ED Notes (Signed)
Pt from home for eval of sob since yesterday, pt also reports palpitations, and chest tightness. Pt denies any hx of afib, afib on the monitor currently at this time. Pt alert and oriented, skin warm and dry. Lung sounds clear. MD at bedside.

## 2015-09-10 NOTE — H&P (Deleted)
[facility] History and Physical  Michael [name] [id number] DOB: 1961-10-28 DOA: 09/10/2015  Referring physician: Dr Michael [name] PCP: No PCP Per Patient   Chief Complaint: Chest pain  HPI: Michael [name] is a 54 y.o. male with hx of CAD/ CABG, DM2 diet controlled, HTN , CHF who presented to ED today with SOB for hte last 2 days, felt cold yesterday.  Has productive cough, pain left calf w some swelling. Increased abdominal swelling to abdomen and legs, cannot breathe lying down.  In ED is in afib with RVR 115, normal BP's, normal SaO2 and creat 3.3.  No distress.   Family interprets, he is Michael [name].  Daughter doesn't know his medical history but has some papers at home and will bring them in tomorrow. She knows he's had bypass surg and a pig valve in 2011 in [place].  Pt lives in [place] and just moved to [place] for permanent residence. She says he has been seeing doctors every month or so. She said one of her uncles told her mother that Mr Michael [name] has "kidney problems" but she has never directly heard of that. She believes she has heard something about atrial fibrillation in his past as well. He is not on any anticoagulation, he is on Coreg.  He takes insulin 20 units daily which didn't make the home med list.     Chart revie  ROS  deniea HA, blurred vision, focal weakness  no n/v/d, no abd pain  no CP  +orthopnea  no cough or wheezing  no joitn pain or skin rash  +L calf pain  Where does patient live just moved to town living w daughter Can patient participate in ADLs? yes  Past Medical History  Past Medical History  Diagnosis Date  . Coronary artery disease   . Diabetes mellitus without complication ([place])   . Hypertension   . CHF (congestive heart failure) [facility])    Past Surgical History  Past Surgical History  Procedure Laterality Date  . Cardiac surgery     Family History No family history on file.  Social History  reports that he has been  smoking.  He does not have any smokeless tobacco history on file. He reports that he does not drink alcohol or use illicit drugs. Allergies No Known Allergies Home medications Prior to Admission medications   Medication Sig Start Date End Date Taking? Authorizing Provider  carvedilol (COREG) 25 MG tablet Take 25 mg by mouth 2 (two) times daily with a meal.   Yes Historical Provider, MD  ergocalciferol (VITAMIN D2) 50000 UNITS capsule Take 50,000 Units by mouth once a week. Take on Saturdays   Yes Historical Provider, MD  furosemide (LASIX) 40 MG tablet Take 40 mg by mouth daily.   Yes Historical Provider, MD  lisinopril (PRINIVIL,ZESTRIL) 10 MG tablet Take 10 mg by mouth daily.   Yes Historical Provider, MD  potassium chloride SA (K-DUR,KLOR-CON) 20 MEQ tablet Take 20 mEq by mouth 2 (two) times daily.   Yes Historical Provider, MD   Liver Function Tests No results for input(s): AST, ALT, ALKPHOS, BILITOT, PROT, ALBUMIN in the last 168 hours. No results for input(s): LIPASE, AMYLASE in the last 168 hours. CBC  Recent Labs Lab 09/10/15 1727  WBC 8.0  HGB 10.4*  HCT 31.8*  MCV 74.1*  PLT 123456   Basic Metabolic Panel  Recent Labs Lab 09/10/15 1727  NA 137  K 5.1  CL 108  CO2 21*  GLUCOSE 183*  BUN 67*  CREATININE 3.38*  CALCIUM 8.9    Filed Vitals:   09/10/15 1830 09/10/15 1915 09/10/15 2000 09/10/15 2030  BP: 112/68 115/92 113/96 111/88  Pulse: 62 95 102 83  Temp:      TempSrc:      Resp: 15 32 18 18  Weight:      SpO2: 99% 98% 98% 99%   Exam: Alert, no distress, calm  SaO2 98% on RA No rash, cyanosis or gangrene Sclera anicteric, throat clear No jvd  Chest clear to bases bilat, no rales or wheezing RRR no MRG, +sternotomy scar Abd soft ntnd no mass, ascites, no hsm GU normal male LE's discoloration above the ankles, chronic, no cords or edema, no erythema 2+ pulses in the feet Neuro moves all extremities , nf, ox 3  Home meds > carvedilol, lasix 40/d,  lisinopril, KCL, vit D2 EKG (independently reviewed) > afib with RVR 118, ST-T changes lateral leads, question lateral ischemia CXR (independently reviewed) > borderline CM , no acute changes Na 137  K 5.1  CO2 21  BUN 67   Creat 3.38   Glu 183    Trop 0.01  WBC 8  Hb 10.4  Plt 203    Assessment: 1. Atrial fib with RVR - suspect this is chronic but not sure, records are pending from pt's PCP. Not on coumadin, is on coreg though. Admit, tele, IV dilt gtt, cards consult in am, start him on IV heparin.   2. Dyspnea/ orthopnea - he has no vol excess , clear lungs and normal SaO2.  May be feeling effects of afib/ RVR.  Get d-dimer (calf pain), consider V/Q.  Start IV heparin as will need it for afib anyways.  3. Renal failure - no records but likely chronic according to hist from daughter.  No vol excess, on ACEi and lasix 4. L calf pain - normal on exam, dopplers ordered 5. DM on insulin 6. HTN on acei/ BB/ lasix 7. Social - is relocating to this area, family to bring in records tomorrow from PCP  Plan - as above   DVT Prophylaxis will be on IV hep  Code Status: full  Family Communication: at bedside  Disposition Plan: home when stable    [facility] Pager [phone]  Cell [phone]  If 7PM-7AM, please contact night-coverage [email or web address] Password [email or web address] 09/10/2015, 8:55 PM

## 2015-09-10 NOTE — ED Notes (Signed)
Pt is here with shortness of breath for 2 days, felt cold yesterday.  Pt reports productive cough. Pt reports pain to left calf, pulse palpable, slight swelling. Pt has increased swelling to abdomen and legs and states cannot breath when lays down to sleep.

## 2015-09-10 NOTE — ED Provider Notes (Addendum)
CSN: [id number]     Arrival date & time 09/10/15  1626 History   First MD Initiated Contact with Patient 09/10/15 1705     Chief Complaint  Patient presents with  . Shortness of Breath     (Consider location/radiation/quality/duration/timing/severity/associated sxs/prior Treatment) HPI...Marland KitchenMarland KitchenMarland Kitchen dyspnea for 24 hours with associated palpitations and chest tightness. Patient denies any history of atrial fibrillation. Past medical history includes CAD, diabetes, hypertension, CHF. Severity is moderate. Nothing makes symptoms better or worse.  Past Medical History  Diagnosis Date  . Coronary artery disease   . Diabetes mellitus without complication ([place])   . Hypertension   . CHF (congestive heart failure) [facility])    Past Surgical History  Procedure Laterality Date  . Cardiac surgery     No family history on file. Social History  Substance Use Topics  . Smoking status: Current Some Day Smoker  . Smokeless tobacco: None  . Alcohol Use: No    Review of Systems  All other systems reviewed and are negative.     Allergies  Review of patient's allergies indicates no known allergies.  Home Medications   Prior to Admission medications   Medication Sig Start Date End Date Taking? Authorizing Provider  carvedilol (COREG) 25 MG tablet Take 25 mg by mouth 2 (two) times daily with a meal.   Yes Historical Provider, MD  ergocalciferol (VITAMIN D2) 50000 UNITS capsule Take 50,000 Units by mouth once a week. Take on Saturdays   Yes Historical Provider, MD  furosemide (LASIX) 40 MG tablet Take 40 mg by mouth daily.   Yes Historical Provider, MD  lisinopril (PRINIVIL,ZESTRIL) 10 MG tablet Take 10 mg by mouth daily.   Yes Historical Provider, MD  potassium chloride SA (K-DUR,KLOR-CON) 20 MEQ tablet Take 20 mEq by mouth 2 (two) times daily.   Yes Historical Provider, MD   BP 107/75 mmHg  Pulse 88  Temp(Src) 97.9 F (36.6 C) (Oral)  Resp 25  Wt 212 lb 2 oz (96.219 kg)  SpO2 97% Physical  Exam  Constitutional: He is oriented to person, place, and time. He appears well-developed and well-nourished.  HENT:  Head: Normocephalic and atraumatic.  Eyes: Conjunctivae and EOM are normal. Pupils are equal, round, and reactive to light.  Neck: Normal range of motion. Neck supple.  Cardiovascular:  Tachycardic; irregularly irregular  Pulmonary/Chest: Effort normal and breath sounds normal.  Abdominal: Soft. Bowel sounds are normal.  Musculoskeletal: Normal range of motion.  Neurological: He is alert and oriented to person, place, and time.  Skin: Skin is warm and dry.  Psychiatric: He has a normal mood and affect. His behavior is normal.  Nursing note and vitals reviewed.   ED Course  Procedures (including critical care time) Labs Review Labs Reviewed  BASIC METABOLIC PANEL - Abnormal; Notable for the following:    CO2 21 (*)    Glucose, Bld 183 (*)    BUN 67 (*)    Creatinine, Ser 3.38 (*)    GFR calc non Af Amer 19 (*)    GFR calc Af Amer 22 (*)    All other components within normal limits  CBC - Abnormal; Notable for the following:    Hemoglobin 10.4 (*)    HCT 31.8 (*)    MCV 74.1 (*)    MCH 24.2 (*)    RDW 16.2 (*)    All other components within normal limits  Randolm Idol, ED    Imaging Review Dg Chest Portable 1 View  09/10/2015  CLINICAL DATA:  Shortness of Breath EXAM: PORTABLE CHEST 1 VIEW COMPARISON:  None. FINDINGS: Cardiomegaly. Status post median sternotomy. No acute infiltrate or pulmonary edema. Mild basilar atelectasis. IMPRESSION: No active disease.  Cardiomegaly.  Status post median sternotomy. Electronically Signed   By: Lahoma Crocker M.D.   On: 09/10/2015 17:37   I have personally reviewed and evaluated these images and lab results as part of my medical decision-making.   EKG Interpretation   Date/Time:  Monday September 10 2015 16:59:52 EDT Ventricular Rate:  119 PR Interval:    QRS Duration: 90 QT Interval:  320 QTC Calculation: 450 R  Axis:   31 Text Interpretation:  Atrial fibrillation with rapid ventricular response  ST \\T \ T wave abnormality, consider lateral ischemia Abnormal ECG  Confirmed by Toinette Lackie  MD, Marian Meneely ([id number]) on 09/10/2015 7:58:18 PM      MDM   Final diagnoses:  Atrial fibrillation, unspecified type (HCC)  Dyspnea    New-onset atrial fibrillation with dyspnea. Patient is hemodynamically stable. Will admit for further evaluation.    [name], MD 09/10/15 [place]  [name], MD 09/10/15 [phone]

## 2015-09-11 ENCOUNTER — Inpatient Hospital Stay (HOSPITAL_COMMUNITY): Payer: Medicare Other

## 2015-09-11 ENCOUNTER — Ambulatory Visit: Payer: Self-pay | Admitting: Cardiovascular Disease

## 2015-09-11 DIAGNOSIS — I481 Persistent atrial fibrillation: Secondary | ICD-10-CM

## 2015-09-11 DIAGNOSIS — M79669 Pain in unspecified lower leg: Secondary | ICD-10-CM

## 2015-09-11 DIAGNOSIS — I4891 Unspecified atrial fibrillation: Secondary | ICD-10-CM

## 2015-09-11 LAB — GLUCOSE, CAPILLARY
GLUCOSE-CAPILLARY: 128 mg/dL — AB (ref 65–99)
Glucose-Capillary: 123 mg/dL — ABNORMAL HIGH (ref 65–99)
Glucose-Capillary: 159 mg/dL — ABNORMAL HIGH (ref 65–99)
Glucose-Capillary: 164 mg/dL — ABNORMAL HIGH (ref 65–99)

## 2015-09-11 LAB — BASIC METABOLIC PANEL
ANION GAP: 13 (ref 5–15)
BUN: 66 mg/dL — AB (ref 6–20)
CHLORIDE: 105 mmol/L (ref 101–111)
CO2: 21 mmol/L — ABNORMAL LOW (ref 22–32)
Calcium: 9.1 mg/dL (ref 8.9–10.3)
Creatinine, Ser: 3.29 mg/dL — ABNORMAL HIGH (ref 0.61–1.24)
GFR calc Af Amer: 23 mL/min — ABNORMAL LOW (ref >60)
GFR calc non Af Amer: 20 mL/min — ABNORMAL LOW (ref >60)
Glucose, Bld: 204 mg/dL — ABNORMAL HIGH (ref 65–99)
POTASSIUM: 4.2 mmol/L (ref 3.5–5.1)
SODIUM: 139 mmol/L (ref 135–145)

## 2015-09-11 LAB — CBC
HEMATOCRIT: 31.9 % — AB (ref 39.0–52.0)
Hemoglobin: 10.4 g/dL — ABNORMAL LOW (ref 13.0–17.0)
MCH: 24.1 pg — ABNORMAL LOW (ref 26.0–34.0)
MCHC: 32.6 g/dL (ref 30.0–36.0)
MCV: 73.8 fL — AB (ref 78.0–100.0)
Platelets: 217 10*3/uL (ref 150–400)
RBC: 4.32 MIL/uL (ref 4.22–5.81)
RDW: 16.3 % — AB (ref 11.5–15.5)
WBC: 7.7 10*3/uL (ref 4.0–10.5)

## 2015-09-11 LAB — HEPARIN LEVEL (UNFRACTIONATED)
HEPARIN UNFRACTIONATED: 0.17 [IU]/mL — AB (ref 0.30–0.70)
Heparin Unfractionated: 0.5 IU/mL (ref 0.30–0.70)
Heparin Unfractionated: 0.43 IU/mL (ref 0.30–0.70)

## 2015-09-11 LAB — MAGNESIUM: Magnesium: 2 mg/dL (ref 1.7–2.4)

## 2015-09-11 LAB — RETICULOCYTES
RBC.: 4.12 MIL/uL — AB (ref 4.22–5.81)
RETIC CT PCT: 1.5 % (ref 0.4–3.1)
Retic Count, Absolute: 61.8 10*3/uL (ref 19.0–186.0)

## 2015-09-11 LAB — TSH: TSH: 1.463 u[IU]/mL (ref 0.350–4.500)

## 2015-09-11 MED ORDER — HEPARIN (PORCINE) IN NACL 100-0.45 UNIT/ML-% IJ SOLN
1550.0000 [IU]/h | INTRAMUSCULAR | Status: DC
  Administered 2015-09-11 – 2015-09-12 (×2): 1550 [IU]/h via INTRAVENOUS
  Filled 2015-09-11 (×4): qty 250

## 2015-09-11 MED ORDER — LORAZEPAM 0.5 MG PO TABS
0.5000 mg | ORAL_TABLET | Freq: Every evening | ORAL | Status: AC | PRN
  Administered 2015-09-11 – 2015-09-12 (×2): 0.5 mg via ORAL
  Filled 2015-09-11 (×3): qty 1

## 2015-09-11 MED ORDER — LORAZEPAM 0.5 MG PO TABS
0.5000 mg | ORAL_TABLET | Freq: Once | ORAL | Status: AC
  Administered 2015-09-11: 0.5 mg via ORAL
  Filled 2015-09-11: qty 1

## 2015-09-11 MED ORDER — TECHNETIUM TC 99M DIETHYLENETRIAME-PENTAACETIC ACID: 31.0000 | Freq: Once | INTRAVENOUS | Status: DC | PRN

## 2015-09-11 MED ORDER — TECHNETIUM TO 99M ALBUMIN AGGREGATED
4.2000 | Freq: Once | INTRAVENOUS | Status: AC | PRN
  Administered 2015-09-11: 4 via INTRAVENOUS

## 2015-09-11 MED ORDER — HEPARIN BOLUS VIA INFUSION
2000.0000 [IU] | INTRAVENOUS | Status: AC
Start: 2015-09-11 — End: 2015-09-11
  Administered 2015-09-11: 2000 [IU] via INTRAVENOUS
  Filled 2015-09-11: qty 2000

## 2015-09-11 MED ORDER — INFLUENZA VAC SPLIT QUAD 0.5 ML IM SUSY
0.5000 mL | PREFILLED_SYRINGE | INTRAMUSCULAR | Status: AC
  Administered 2015-09-13: 0.5 mL via INTRAMUSCULAR
  Filled 2015-09-11: qty 0.5

## 2015-09-11 NOTE — Progress Notes (Addendum)
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 In ED found to be with A. fib with RVR. Admitted for management. Cardiology consulted.   Assessment/Plan:  Atrial fibrillation with RVR - Unable to tell whether this is paroxysmal, chronic or new. Not on anticoagulants PTA. - Started on IV Cardizem drip after bolus and Coreg 25 MG twice a day. Started IV heparin. Now rate controlled-Taper and discontinue Cardizem drip. - Follow 2-D echo, TSH and VQ scan - Cardiology consultation appreciated: Rate now well controlled on Coreg 25 MG twice a day. CHADSVASC is 4. He was started on IV heparin and will need to be on Coumadin prior to discharge. - Cardiology will consider TEE/cardioversion after reviewing 2-D echo versus doing it after 30 days of therapeutic INR - Holding off ischemic evaluation for now. - Obtain outside records.  Uncontrolled DM 2 with renal complications - Started Lantus and SSI - Check A1c - Consider oral hypoglycemics versus insulins at discharge.  Essential hypertension/hypotension - Systolic blood pressures in the 90s this morning-asymptomatic. Monitor while on carvedilol. DC lisinopril for now given hypotension and unsure what his baseline creatinine is. - Taper and discontinue Cardizem drip  Anemia microcytic - Possibly from chronic kidney disease. Rule out iron deficiency. Check anemia panel - Stable.  Possible stage IV chronic kidney disease - Baseline creatinine is not known. Admitted with creatinine of  3.38. - Renal ultrasound: No hydronephrosis - We will need outpatient nephrology follow-up. Follow BMP in a.m.  Left foot pain/elevated d-dimer/dyspnea - VQ scan: Very low probability for PE - Left Lower extremity venous Doppler: Negative for DVT  CAD, s/p CABG, s/p AVR- pig valve - Obtain outside hospital medical records.  Tobacco abuse - Need cessation counseling.    DVT prophylaxis: Lovenox Code Status: Full Family Communication: None at bedside Disposition Plan: EC home when medically stable   Consultants:  Cardiology  Procedures:  Left lower extremity venous Doppler 09/11/15: Vascular Ultrasound Left lower extremity venous duplex has been completed. Preliminary findings: negative for DVT.  Antibiotics:  None   Subjective: Able to communicate with some English. States that he was unable to sleep last night. Orthopnea. No chest pain. Complains of and shows left mid foot pain. As per nursing, restless overnight without any acute issues.  Objective: Filed Vitals:   09/11/15 1005 09/11/15 1230 09/11/15 1231 09/11/15 1315  BP: 85/50 92/42 95/61  97/47  Pulse: 72 76  77  Temp:    97.8 F (36.6 C)  TempSrc:    Oral  Resp:    17  Height:      Weight:      SpO2:    97%    Intake/Output Summary (Last 24 hours) at 09/11/15 1457 Last data filed at 09/11/15 1300  Gross per 24 hour  Intake    483 ml  Output    450 ml  Net     33 ml   Filed Weights   09/10/15 2212 09/10/15 2217 09/11/15 0227  Weight: 95.029 kg (209 lb 8 oz) 95.5 kg (210 lb 8.6 oz) 95.9 kg (211 lb 6.7 oz)  Exam:  General exam: Pleasant middle-aged male lying comfortably propped up in bed. Respiratory system: Clear. No increased work of breathing. Midline sternotomy scar. Cardiovascular system: S1 & S2 heard, RRR. No JVD, murmurs, gallops, clicks or pedal edema. Telemetry: A. fib with controlled ventricular rate. Gastrointestinal system: Abdomen is nondistended, soft and nontender. Normal  bowel sounds heard. Central nervous system: Alert and oriented. No focal neurological deficits. Extremities: Symmetric 5 x 5 power. No acute findings.   Data Reviewed: Basic Metabolic Panel:  Recent Labs Lab 09/10/15 1727 09/11/15 0337  NA 137 139  K 5.1 4.2  CL 108 105  CO2 21* 21*  GLUCOSE 183* 204*  BUN 67* 66*  CREATININE 3.38* 3.29*  CALCIUM 8.9 9.1  MG  --  2.0   Liver Function Tests: No results for input(s): AST, ALT, ALKPHOS, BILITOT, PROT, ALBUMIN in the last 168 hours. No results for input(s): LIPASE, AMYLASE in the last 168 hours. No results for input(s): AMMONIA in the last 168 hours. CBC:  Recent Labs Lab 09/10/15 1727 09/11/15 0337  WBC 8.0 7.7  HGB 10.4* 10.4*  HCT 31.8* 31.9*  MCV 74.1* 73.8*  PLT 203 217   Cardiac Enzymes: No results for input(s): CKTOTAL, CKMB, CKMBINDEX, TROPONINI in the last 168 hours. BNP (last 3 results) No results for input(s): PROBNP in the last 8760 hours. CBG:  Recent Labs Lab 09/10/15 2227 09/10/15 2336 09/11/15 0625 09/11/15 1210  GLUCAP 93 149* 123* 159*    No results found for this or any previous visit (from the past 240 hour(s)).       Studies: US Renal  09/11/2015  CLINICAL DATA:  54 year old [name] with stage 4 chronic kidney disease. EXAM: RENAL / URINARY TRACT ULTRASOUND COMPLETE COMPARISON:  None. FINDINGS: Right Kidney: Length: Approximately 10.2 cm. No hydronephrosis. Echogenic parenchyma. No focal parenchymal abnormality. No visible shadowing calculi. Left Kidney: Length: Approximately 10.6 cm. No hydronephrosis. Echogenic parenchyma. Approximate 1.8 x 1.1 x 1.6 cm cyst arising from the upper pole. No solid parenchymal mass. Tram track calcification with shadowing, consistent with intrarenal vessels. No definite shadowing calculi. Bladder: Normal for degree of bladder distention. IMPRESSION: 1. No evidence of hydronephrosis involving either kidney. 2. Echogenic renal parenchyma as expected with the  given history of stage 4 chronic kidney disease. 3. Benign approximate 2 cm cyst arising from the upper pole of the left kidney. Electronically Signed   By: Evangeline Dakin M.D.   On: 09/11/2015 10:55   Nm Pulmonary Perf And Vent  09/11/2015  CLINICAL DATA:  Shortness of Breath EXAM: NUCLEAR MEDICINE VENTILATION - PERFUSION LUNG SCAN Views: Anterior, posterior, left lateral, right lateral, RPO, LPO, RAO, LAO -ventilation and perfusion Radionuclide: Technetium 85m DTPA -ventilation; Technetium 84m macroaggregated albumin-perfusion Dose:  0000000 millicurie-ventilation; [id number] millicurie-perfusion Route of administration: Inhalation -ventilation ; intravenous -perfusion COMPARISON:  Chest radiograph September 10, 2015 FINDINGS: Ventilation: The distribution of radiotracer uptake on the ventilation study is homogeneous and symmetric bilaterally. Heart is noted to be enlarged. Perfusion: The distribution of radiotracer uptake on the perfusion study is homogeneous and symmetric bilaterally. The heart is noted to be enlarged. IMPRESSION: No appreciable ventilation or perfusion defects. This study is felt to constitute an overall very low probability of pulmonary embolus. Cardiomegaly noted. Electronically Signed   By: Lowella Grip III M.D.   On: 09/11/2015 12:36   Dg Chest Portable 1 View  09/10/2015  CLINICAL DATA:  Shortness of Breath EXAM: PORTABLE CHEST 1 VIEW COMPARISON:  None. FINDINGS: Cardiomegaly. Status post  median sternotomy. No acute infiltrate or pulmonary edema. Mild basilar atelectasis. IMPRESSION: No active disease.  Cardiomegaly.  Status post median sternotomy. Electronically Signed   By: Lahoma Crocker M.D.   On: 09/10/2015 17:37        Scheduled Meds: . carvedilol  25 mg Oral BID WC  . furosemide  40 mg Oral Daily  . [START ON 09/12/2015] Influenza vac split quadrivalent PF  0.5 mL Intramuscular Tomorrow-1000  . insulin aspart  0-5 Units Subcutaneous QHS  . insulin aspart  0-9 Units  Subcutaneous TID WC  . insulin glargine  20 Units Subcutaneous QHS  . lisinopril  10 mg Oral Daily  . sodium chloride  3 mL Intravenous Q12H   Continuous Infusions: . diltiazem (CARDIZEM) infusion 5 mg/hr (09/11/15 0956)  . heparin 1,550 Units/hr (09/11/15 1239)    Principal Problem:   Atrial fibrillation with rapid ventricular response (HCC) Active Problems:   CKD (chronic kidney disease), stage IV (HCC)   Calf pain   SOB (shortness of breath)   DM type 2 (diabetes mellitus, type 2) ([place])   Essential hypertension    Time spent: 40 minutes.    Vernell Leep, MD, FACP, FHM. [facility] Pager [phone]  If 7PM-7AM, please contact night-coverage [email or web address] Password [email or web address] 09/11/2015, 2:57 PM    LOS: 1 day

## 2015-09-11 NOTE — Progress Notes (Signed)
  Echocardiogram 2D Echocardiogram has been performed.  Johny Chess 09/11/2015, 2:43 PM

## 2015-09-11 NOTE — H&P (Signed)
[facility] History and Physical  Michael [name] [id number] DOB: Jun 30, 1961 DOA: 09/10/2015  Referring physician: Dr [name] PCP: No PCP Per Patient   Chief Complaint: Chest pain  HPI: Michael [name] is a 54 y.o. male with hx of CAD/ CABG, DM2 diet controlled, HTN , CHF who presented to ED today with SOB for hte last 2 days, felt cold yesterday. Has productive cough, pain left calf w some swelling. Increased abdominal swelling to abdomen and legs, cannot breathe lying down. In ED is in afib with RVR 115, normal BP's, normal SaO2 and creat 3.3. No distress.   Family interprets, he is [name]. Daughter doesn't know his medical history but has some papers at home and will bring them in tomorrow. She knows he's had bypass surg and a pig valve in 2011 in [place]. Pt lives in [place] and just moved to [place] for permanent residence. She says he has been seeing doctors every month or so. She said one of her uncles told her mother that Mr Masser has "kidney problems" but she has never directly heard of that. She believes she has heard something about atrial fibrillation in his past as well. He is not on any anticoagulation, he is on Coreg. He takes insulin 20 units daily which didn't make the home med list.    Chart revie  ROS deniea HA, blurred vision, focal weakness no n/v/d, no abd pain no CP +orthopnea no cough or wheezing no joitn pain or skin rash +L calf pain  Where does patient live just moved to town living w daughter Can patient participate in ADLs? yes  Past Medical History  Past Medical History  Diagnosis Date  . Coronary artery disease   . Diabetes mellitus without complication ([place])   . Hypertension   . CHF (congestive heart failure) [facility])    Past Surgical History  Past Surgical History  Procedure Laterality Date  . Cardiac surgery     Family History No family history on file.   Social History  reports that he has been smoking. He does not have any smokeless tobacco history on file. He reports that he does not drink alcohol or use illicit drugs. Allergies No Known Allergies Home medications Prior to Admission medications   Medication Sig Start Date End Date Taking? Authorizing Provider  carvedilol (COREG) 25 MG tablet Take 25 mg by mouth 2 (two) times daily with a meal.   Yes Historical Provider, MD  ergocalciferol (VITAMIN D2) 50000 UNITS capsule Take 50,000 Units by mouth once a week. Take on Saturdays   Yes Historical Provider, MD  furosemide (LASIX) 40 MG tablet Take 40 mg by mouth daily.   Yes Historical Provider, MD  lisinopril (PRINIVIL,ZESTRIL) 10 MG tablet Take 10 mg by mouth daily.   Yes Historical Provider, MD  potassium chloride SA (K-DUR,KLOR-CON) 20 MEQ tablet Take 20 mEq by mouth 2 (two) times daily.   Yes Historical Provider, MD   Liver Function Tests  Last Labs     No results for input(s): AST, ALT, ALKPHOS, BILITOT, PROT, ALBUMIN in the last 168 hours.    Last Labs     No results for input(s): LIPASE, AMYLASE in the last 168 hours.   CBC  Last Labs      Recent Labs Lab 09/10/15 1727  WBC 8.0  HGB 10.4*  HCT 31.8*  MCV 74.1*  PLT 203     Basic Metabolic Panel  Last Labs      Recent Labs  Lab 09/10/15 1727  NA 137  K 5.1  CL 108  CO2 21*  GLUCOSE 183*  BUN 67*  CREATININE 3.38*  CALCIUM 8.9      Filed Vitals:   09/10/15 1830 09/10/15 1915 09/10/15 2000 09/10/15 2030  BP: 112/68 115/92 113/96 111/88  Pulse: 62 95 102 83  Temp:      TempSrc:      Resp: 15 32 18 18  Weight:      SpO2: 99% 98% 98% 99%   Exam: Alert, no distress, calm SaO2 98% on RA No rash, cyanosis or gangrene Sclera anicteric, throat clear No jvd  Chest clear to bases bilat, no rales or wheezing RRR no MRG, +sternotomy scar Abd soft  ntnd no mass, ascites, no hsm GU normal male LE's discoloration above the ankles, chronic, no cords or edema, no erythema 2+ pulses in the feet Neuro moves all extremities , nf, ox 3  Home meds > carvedilol, lasix 40/d, lisinopril, KCL, vit D2 EKG (independently reviewed) > afib with RVR 118, ST-T changes lateral leads, question lateral ischemia CXR (independently reviewed) > borderline CM , no acute changes Na 137 K 5.1 CO2 21 BUN 67 Creat 3.38 Glu 183  Trop 0.01  WBC 8 Hb 10.4 Plt 203   Assessment: 1. Atrial fib with RVR - suspect this is chronic but not sure, records are pending from pt's PCP. Not on coumadin, is on coreg though. Admit, tele, IV dilt gtt, cards consult in am, start him on IV heparin.  2. Dyspnea/ orthopnea - he has no vol excess , clear lungs and normal SaO2. May be feeling effects of afib/ RVR. Get d-dimer (calf pain), consider V/Q. Start IV heparin as will need it for afib anyways.  3. Renal failure - no records but likely chronic according to hist from daughter. No vol excess, on ACEi and lasix 4. L calf pain - normal on exam, dopplers ordered 5. DM on insulin 6. HTN on acei/ BB/ lasix 7. Social - is relocating to this area, family to bring in records tomorrow from PCP  Plan - as above   DVT Prophylaxis will be on IV hep  Code Status: full  Family Communication: at bedside  Disposition Plan: home when stable    [facility] Pager [phone] Cell [phone]  If 7PM-7AM, please contact night-coverage [email or web address] Password [email or web address] 09/10/2015, 8:55 PM         Routing History     Date/Time From To Method   09/10/2015 9:51 PM Roney Jaffe, MD Roney Jaffe, MD Fax   09/10/2015 9:51 PM Roney Jaffe, MD No Pcp Per Patient In Basket

## 2015-09-11 NOTE — Progress Notes (Signed)
Utilization review completed. Oveda Dadamo, RN, BSN. 

## 2015-09-11 NOTE — Progress Notes (Signed)
*  PRELIMINARY RESULTS* Vascular Ultrasound Left lower extremity venous duplex has been completed.  Preliminary findings: negative for DVT.   Landry Mellow, RDMS, RVT  09/11/2015, 9:00 AM

## 2015-09-11 NOTE — Consult Note (Addendum)
Radiologist: [place]  Reason for Consult: Atrial fibrillation with rapid ventricular response Referring Physician:   Jamicah Anstead is an 54 y.o. male.  HPI:   The patient's a 54 year old male with history of reported valve dz (s/p surgery 2011), continued tobacco abuse, diabetes mellitus, hypertension and congestive heart failure.  His bypass grafting was done in [place] in 2011. He's not had heart catheterization since. Patient reports developing shortness of breath yesterday and he was unable to sleep due to orthopnea.  He's also had some left lower extremity pain, abdominal pain has been very tired.  The patient currently denies nausea, vomiting, fever, chest pain, dizziness, cough, congestion, hematochezia, melena, lower extremity edema.  Home medications Medication Sig  carvedilol (COREG) 25 MG tablet Take 25 mg by mouth 2 (two) times daily with a meal.  ergocalciferol (VITAMIN D2) 50000 UNITS capsule Take 50,000 Units by mouth once a week. Take on Saturdays  furosemide (LASIX) 40 MG tablet Take 40 mg by mouth daily.  lisinopril (PRINIVIL,ZESTRIL) 10 MG tablet Take 10 mg by mouth daily.  potassium chloride SA (K-DUR,KLOR-CON) 20 MEQ tablet Take 20 mEq by mouth 2 (two) times daily.      Past Medical History  Diagnosis Date  . Coronary artery disease   . Diabetes mellitus without complication ([place])   . Hypertension   . CHF (congestive heart failure) [facility])     Past Surgical History  Procedure Laterality Date  . Cardiac surgery      No family history on file.  Social History:  reports that he has been smoking.  He does not have any smokeless tobacco history on file. He reports that he does not drink alcohol or use illicit drugs.  Allergies: No Known Allergies  Medications:  Scheduled Meds: . carvedilol  25 mg Oral BID WC  . furosemide  40 mg Oral Daily  . [START ON 09/12/2015] Influenza vac split quadrivalent PF  0.5 mL Intramuscular Tomorrow-1000  .  insulin aspart  0-5 Units Subcutaneous QHS  . insulin aspart  0-9 Units Subcutaneous TID WC  . insulin glargine  20 Units Subcutaneous QHS  . lisinopril  10 mg Oral Daily  . sodium chloride  3 mL Intravenous Q12H   Continuous Infusions: . diltiazem (CARDIZEM) infusion 5 mg/hr (09/10/15 2300)  . heparin 1,350 Units/hr (09/10/15 2250)   PRN Meds:.sodium chloride, acetaminophen, ondansetron (ZOFRAN) IV, sodium chloride   Results for orders placed or performed during the hospital encounter of 09/10/15 (from the past 48 hour(s))  Basic metabolic panel     Status: Abnormal   Collection Time: 09/10/15  5:27 PM  Result Value Ref Range   Sodium 137 135 - 145 mmol/L   Potassium 5.1 3.5 - 5.1 mmol/L   Chloride 108 101 - 111 mmol/L   CO2 21 (L) 22 - 32 mmol/L   Glucose, Bld 183 (H) 65 - 99 mg/dL   BUN 67 (H) 6 - 20 mg/dL   Creatinine, Ser 3.38 (H) 0.61 - 1.24 mg/dL   Calcium 8.9 8.9 - 10.3 mg/dL   GFR calc non Af Amer 19 (L) >60 mL/min   GFR calc Af Amer 22 (L) >60 mL/min    Comment: (NOTE) The eGFR has been calculated using the CKD EPI equation. This calculation has not been validated in all clinical situations. eGFR's persistently <60 mL/min signify possible Chronic Kidney Disease.    Anion gap 8 5 - 15  CBC     Status: Abnormal   Collection Time: 09/10/15  5:27 PM  Result Value Ref Range   WBC 8.0 4.0 - 10.5 K/uL   RBC 4.29 4.22 - 5.81 MIL/uL   Hemoglobin 10.4 (L) 13.0 - 17.0 g/dL   HCT 31.8 (L) 39.0 - 52.0 %   MCV 74.1 (L) 78.0 - 100.0 fL   MCH 24.2 (L) 26.0 - 34.0 pg   MCHC 32.7 30.0 - 36.0 g/dL   RDW 16.2 (H) 11.5 - 15.5 %   Platelets 203 150 - 400 K/uL  I-stat troponin, ED     Status: None   Collection Time: 09/10/15  5:35 PM  Result Value Ref Range   Troponin i, poc 0.01 0.00 - 0.08 ng/mL   Comment 3            Comment: Due to the release kinetics of cTnI, a negative result within the first hours of the onset of symptoms does not rule out myocardial infarction with  certainty. If myocardial infarction is still suspected, repeat the test at appropriate intervals.   Glucose, capillary     Status: None   Collection Time: 09/10/15 10:27 PM  Result Value Ref Range   Glucose-Capillary 93 65 - 99 mg/dL   Comment 1 Notify RN    Comment 2 Document in Chart   D-dimer, quantitative (not at [facility])     Status: Abnormal   Collection Time: 09/10/15 10:30 PM  Result Value Ref Range   D-Dimer, Quant 0.99 (H) 0.00 - 0.48 ug/mL-FEU    Comment:        AT THE INHOUSE ESTABLISHED CUTOFF VALUE OF 0.48 ug/mL FEU, THIS ASSAY HAS BEEN DOCUMENTED IN THE LITERATURE TO HAVE A SENSITIVITY AND NEGATIVE PREDICTIVE VALUE OF AT LEAST 98 TO 99%.  THE TEST RESULT SHOULD BE CORRELATED WITH AN ASSESSMENT OF THE CLINICAL PROBABILITY OF DVT / VTE.   Glucose, capillary     Status: Abnormal   Collection Time: 09/10/15 11:36 PM  Result Value Ref Range   Glucose-Capillary 149 (H) 65 - 99 mg/dL  Magnesium     Status: None   Collection Time: 09/11/15  3:37 AM  Result Value Ref Range   Magnesium 2.0 1.7 - 2.4 mg/dL  Basic metabolic panel     Status: Abnormal   Collection Time: 09/11/15  3:37 AM  Result Value Ref Range   Sodium 139 135 - 145 mmol/L   Potassium 4.2 3.5 - 5.1 mmol/L    Comment: DELTA CHECK NOTED NO VISIBLE HEMOLYSIS    Chloride 105 101 - 111 mmol/L   CO2 21 (L) 22 - 32 mmol/L   Glucose, Bld 204 (H) 65 - 99 mg/dL   BUN 66 (H) 6 - 20 mg/dL   Creatinine, Ser 3.29 (H) 0.61 - 1.24 mg/dL   Calcium 9.1 8.9 - 10.3 mg/dL   GFR calc non Af Amer 20 (L) >60 mL/min   GFR calc Af Amer 23 (L) >60 mL/min    Comment: (NOTE) The eGFR has been calculated using the CKD EPI equation. This calculation has not been validated in all clinical situations. eGFR's persistently <60 mL/min signify possible Chronic Kidney Disease.    Anion gap 13 5 - 15  CBC     Status: Abnormal   Collection Time: 09/11/15  3:37 AM  Result Value Ref Range   WBC 7.7 4.0 - 10.5 K/uL   RBC 4.32 4.22 -  5.81 MIL/uL   Hemoglobin 10.4 (L) 13.0 - 17.0 g/dL   HCT 31.9 (L) 39.0 - 52.0 %   MCV  73.8 (L) 78.0 - 100.0 fL   MCH 24.1 (L) 26.0 - 34.0 pg   MCHC 32.6 30.0 - 36.0 g/dL   RDW 16.3 (H) 11.5 - 15.5 %   Platelets 217 150 - 400 K/uL  Glucose, capillary     Status: Abnormal   Collection Time: 09/11/15  6:25 AM  Result Value Ref Range   Glucose-Capillary 123 (H) 65 - 99 mg/dL  Heparin level (unfractionated)     Status: Abnormal   Collection Time: 09/11/15  8:00 AM  Result Value Ref Range   Heparin Unfractionated 0.17 (L) 0.30 - 0.70 IU/mL    Comment:        IF HEPARIN RESULTS ARE BELOW EXPECTED VALUES, AND PATIENT DOSAGE HAS BEEN CONFIRMED, SUGGEST FOLLOW UP TESTING OF ANTITHROMBIN III LEVELS.     Dg Chest Portable 1 View  09/10/2015  CLINICAL DATA:  Shortness of Breath EXAM: PORTABLE CHEST 1 VIEW COMPARISON:  None. FINDINGS: Cardiomegaly. Status post median sternotomy. No acute infiltrate or pulmonary edema. Mild basilar atelectasis. IMPRESSION: No active disease.  Cardiomegaly.  Status post median sternotomy. Electronically Signed   By: Lahoma Crocker M.D.   On: 09/10/2015 17:37    Review of Systems  Constitutional: Positive for malaise/fatigue. Negative for fever and diaphoresis.  HENT: Negative for congestion.   Respiratory: Positive for shortness of breath. Negative for cough and wheezing.   Cardiovascular: Positive for orthopnea and PND. Negative for chest pain and leg swelling.  Gastrointestinal: Positive for abdominal pain. Negative for nausea, vomiting, diarrhea, constipation, blood in stool and melena.  Musculoskeletal: Negative for myalgias.  Neurological: Negative for dizziness.  All other systems reviewed and are negative.  Blood pressure 103/67, pulse 86, temperature 97.8 F (36.6 C), temperature source Oral, resp. rate 18, height '5\' 3"'  (1.6 m), weight 211 lb 6.7 oz (95.9 kg), SpO2 97 %. Physical Exam  Nursing note and vitals reviewed. Constitutional: He is oriented  to person, place, and time. He appears well-developed. No distress.  Obese  HENT:  Head: Normocephalic and atraumatic.  Eyes: EOM are normal. Pupils are equal, round, and reactive to light. No scleral icterus.  Neck: Normal range of motion. Neck supple.  Cardiovascular: Normal rate.  An irregularly irregular rhythm present. Exam reveals gallop and S3.   No murmur heard. Pulses:      Radial pulses are 2+ on the right side, and 2+ on the left side.       Dorsalis pedis pulses are 2+ on the right side, and 2+ on the left side.  Respiratory: Effort normal and breath sounds normal. He has no wheezes. He has no rales.  GI: Soft. Bowel sounds are normal. He exhibits no distension. There is no tenderness.  Musculoskeletal: He exhibits no edema.  Lymphadenopathy:    He has no cervical adenopathy.  Neurological: He is alert and oriented to person, place, and time. He exhibits normal muscle tone.  Skin: Skin is warm and dry.  Psychiatric: He has a normal mood and affect.    Neck full  No bruits. Lungs are clear Cardiac exam-irreg irreg.  No S3 Tr LE edema    Assessment/Plan: Principal Problem:   Atrial fibrillation with rapid ventricular response (HCC) Active Problems:   CKD (chronic kidney disease), stage IV (HCC)   Calf pain   SOB (shortness of breath)   DM type 2 (diabetes mellitus, type 2) (HCC)   Essential hypertension   tobacco abuse  54 year old male with history of valve surgery ([place]),  continued  tobacco abuse, diabetes mellitus, hypertension and congestive heart failure1.  He presents with acute onset shortness of breath yesterday.  He was found to be in atrial fibrillation with rapid ventricular response  Denies palpitations. Troponin normal  EKG with lateral  T wave inversion.   He was given 15 mg of IV Cardizem in the emergency room last night. Heart rate is now well controlled on Coreg 25 mg twice daily.   CHADSVASC is 4.  He was started on IV heparin and will need to be on  Coumadin prior to discharge.  After reviewing 2-D echocardiogram result, we can consider TEE/cardioversion were doing it after 30 days of therapeutic INR. .    I would hold off on ischemic evaluation(Lexiscan) as he is not having CP and he has CKD IV.  Get records He does have an elevated d-dimer and is scheduled for VQ scan.  Left lower extremity venous Doppler was negative for DVT.Marland Kitchen    HAGER, BRYAN, PA-C 09/11/2015, 9:29 AM   Pt seen and examined  I have amended note oabve. Pt with new atrial fib  Rates controlled. Echo just done. Need to get records from [facility] about prior surgery.    [name]

## 2015-09-11 NOTE — Progress Notes (Signed)
ANTICOAGULATION CONSULT NOTE - Follow Up Consult  Pharmacy Consult for Heparin  Indication: atrial fibrillation  No Known Allergies  Patient Measurements: Height: 5\' 3"  (160 cm) Weight: 211 lb 6.7 oz (95.9 kg) IBW/kg (Calculated) : 56.9 Vital Signs: Temp: 97.5 F (36.4 C) (10/25 2057) Temp Source: Oral (10/25 2057) BP: 110/76 mmHg (10/25 2057) Pulse Rate: 85 (10/25 2057)  Labs:  Recent Labs  09/10/15 1727 09/11/15 0337 09/11/15 0800 09/11/15 1530 09/11/15 2259  HGB 10.4* 10.4*  --   --   --   HCT 31.8* 31.9*  --   --   --   PLT 203 217  --   --   --   HEPARINUNFRC  --   --  0.17* 0.50 0.43  CREATININE 3.38* 3.29*  --   --   --     Estimated Creatinine Clearance: 26 mL/min (by C-G formula based on Cr of 3.29).  Assessment: Therapeutic heparin level x 2 Goal of Therapy:  Heparin level 0.3-0.7 units/ml Monitor platelets by anticoagulation protocol: Yes   Plan:  -Continue heparin at 1550 units/hr -Daily CBC/HL -Monitor for bleeding -Looks like cardiology planning for warfarin   Narda Bonds 09/11/2015,11:34 PM

## 2015-09-11 NOTE — Consult Note (Addendum)
ANTICOAGULATION CONSULT NOTE - Follow up  Pharmacy Consult for Heparin Indication: atrial fibrillation  No Known Allergies  Patient Measurements: Height: 5\' 3"  (160 cm) Weight: 211 lb 6.7 oz (95.9 kg) IBW/kg (Calculated) : 56.9  Heparin dosing weight = 78.5 kg  Vital Signs: Temp: 97.8 F (36.6 C) (10/25 0629) Temp Source: Oral (10/25 0629) BP: 103/67 mmHg (10/25 0629) Pulse Rate: 86 (10/25 0629)  Labs:  Recent Labs  09/10/15 1727 09/11/15 0337 09/11/15 0800  HGB 10.4* 10.4*  --   HCT 31.8* 31.9*  --   PLT 203 217  --   HEPARINUNFRC  --   --  0.17*  CREATININE 3.38* 3.29*  --     Estimated Creatinine Clearance: 26 mL/min (by C-G formula based on Cr of 3.29).   Medical History: Past Medical History  Diagnosis Date  . Coronary artery disease   . Diabetes mellitus without complication ([place])   . Hypertension   . CHF (congestive heart failure) (HCC)     Medications:  No anticoagulants pta  Assessment: 54yom presents to the ED with dyspnea x 2 days. He was found to be in afib ? new onset. CHADSVASC is at least 2. He is also in renal failure which may be chronic per nephrology. Started on IV heparin infusion protocol per pharmacy last night. Initial 8 hour heparin level 0.17 is low. No bleeding or complications from IV heparin drip noted per RN's report. CBC stable.  Goal of Therapy:  Heparin level 0.3-0.7 units/ml Monitor platelets by anticoagulation protocol: Yes   Plan:  1) Rebolus with 2000 units IVHeparin x 1 2) Increase Heparin drip to 1550 units/hr 3) Check 6 hour heparin level 4) Daily heparin level and CBC  Thank you for allowing pharmacy to be part of this patients care team. Nicole Cella, RPh Clinical Pharmacist Pager: [phone]  09/11/2015,9:34 AM   ADDENDUM:  HL came back therapeutic at 0.5.  Plan: Continue heparin gtt at 1550 units/hr Check 8 hr HL Monitor daily HL, CBC, s/s of bleed  Elenor Quinones, PharmD Clinical  Pharmacist Pager [phone] 09/11/2015 4:24 PM

## 2015-09-12 ENCOUNTER — Encounter (HOSPITAL_COMMUNITY): Payer: Self-pay | Admitting: Physician Assistant

## 2015-09-12 DIAGNOSIS — I5043 Acute on chronic combined systolic (congestive) and diastolic (congestive) heart failure: Secondary | ICD-10-CM | POA: Insufficient documentation

## 2015-09-12 DIAGNOSIS — I059 Rheumatic mitral valve disease, unspecified: Secondary | ICD-10-CM | POA: Insufficient documentation

## 2015-09-12 DIAGNOSIS — I5023 Acute on chronic systolic (congestive) heart failure: Secondary | ICD-10-CM

## 2015-09-12 DIAGNOSIS — I4891 Unspecified atrial fibrillation: Principal | ICD-10-CM

## 2015-09-12 LAB — BASIC METABOLIC PANEL
Anion gap: 15 (ref 5–15)
BUN: 59 mg/dL — AB (ref 6–20)
CHLORIDE: 107 mmol/L (ref 101–111)
CO2: 17 mmol/L — ABNORMAL LOW (ref 22–32)
CREATININE: 2.5 mg/dL — AB (ref 0.61–1.24)
Calcium: 9 mg/dL (ref 8.9–10.3)
GFR calc Af Amer: 32 mL/min — ABNORMAL LOW (ref >60)
GFR, EST NON AFRICAN AMERICAN: 27 mL/min — AB (ref >60)
GLUCOSE: 125 mg/dL — AB (ref 65–99)
POTASSIUM: 4.4 mmol/L (ref 3.5–5.1)
SODIUM: 139 mmol/L (ref 135–145)

## 2015-09-12 LAB — CBC
HCT: 31.1 % — ABNORMAL LOW (ref 39.0–52.0)
Hemoglobin: 10.1 g/dL — ABNORMAL LOW (ref 13.0–17.0)
MCH: 24.3 pg — AB (ref 26.0–34.0)
MCHC: 32.5 g/dL (ref 30.0–36.0)
MCV: 74.8 fL — AB (ref 78.0–100.0)
PLATELETS: 194 10*3/uL (ref 150–400)
RBC: 4.16 MIL/uL — ABNORMAL LOW (ref 4.22–5.81)
RDW: 16.6 % — ABNORMAL HIGH (ref 11.5–15.5)
WBC: 5.5 10*3/uL (ref 4.0–10.5)

## 2015-09-12 LAB — FERRITIN: Ferritin: 124 ng/mL (ref 24–336)

## 2015-09-12 LAB — PROTIME-INR
INR: 1.18 (ref 0.00–1.49)
Prothrombin Time: 15.2 seconds (ref 11.6–15.2)

## 2015-09-12 LAB — IRON AND TIBC
IRON: 56 ug/dL (ref 45–182)
SATURATION RATIOS: 18 % (ref 17.9–39.5)
TIBC: 312 ug/dL (ref 250–450)
UIBC: 256 ug/dL

## 2015-09-12 LAB — GLUCOSE, CAPILLARY
GLUCOSE-CAPILLARY: 89 mg/dL (ref 65–99)
GLUCOSE-CAPILLARY: 137 mg/dL — AB (ref 65–99)
Glucose-Capillary: 146 mg/dL — ABNORMAL HIGH (ref 65–99)

## 2015-09-12 LAB — VITAMIN B12: VITAMIN B 12: 391 pg/mL (ref 180–914)

## 2015-09-12 LAB — HEPARIN LEVEL (UNFRACTIONATED): Heparin Unfractionated: 0.33 IU/mL (ref 0.30–0.70)

## 2015-09-12 LAB — HEMOGLOBIN A1C
Hgb A1c MFr Bld: 7.8 % — ABNORMAL HIGH (ref 4.8–5.6)
MEAN PLASMA GLUCOSE: 177 mg/dL

## 2015-09-12 LAB — FOLATE: Folate: 16.1 ng/mL (ref >5.9)

## 2015-09-12 MED ORDER — WARFARIN VIDEO
Freq: Once | Status: AC
  Administered 2015-09-12: 16:00:00

## 2015-09-12 MED ORDER — CARVEDILOL 12.5 MG PO TABS
12.5000 mg | ORAL_TABLET | Freq: Once | ORAL | Status: AC
  Administered 2015-09-12: 12.5 mg via ORAL
  Filled 2015-09-12: qty 1

## 2015-09-12 MED ORDER — WARFARIN - PHARMACIST DOSING INPATIENT
Freq: Every day | Status: DC
  Administered 2015-09-12: 18:00:00

## 2015-09-12 MED ORDER — CARVEDILOL 12.5 MG PO TABS
37.5000 mg | ORAL_TABLET | Freq: Two times a day (BID) | ORAL | Status: DC
Start: 2015-09-12 — End: 2015-09-14
  Administered 2015-09-12 – 2015-09-14 (×4): 37.5 mg via ORAL
  Filled 2015-09-12 (×8): qty 1

## 2015-09-12 MED ORDER — FUROSEMIDE 10 MG/ML IJ SOLN
60.0000 mg | Freq: Once | INTRAMUSCULAR | Status: AC
  Administered 2015-09-12: 60 mg via INTRAVENOUS
  Filled 2015-09-12: qty 6

## 2015-09-12 MED ORDER — COUMADIN BOOK
Freq: Once | Status: AC
  Administered 2015-09-12: 1
  Filled 2015-09-12: qty 1

## 2015-09-12 MED ORDER — WARFARIN SODIUM 10 MG PO TABS
10.0000 mg | ORAL_TABLET | Freq: Once | ORAL | Status: AC
  Administered 2015-09-12: 10 mg via ORAL
  Filled 2015-09-12: qty 1

## 2015-09-12 NOTE — Consult Note (Addendum)
ANTICOAGULATION CONSULT NOTE - Follow up  Pharmacy Consult for Heparin  (and adding Coumadin) Indication: atrial fibrillation  No Known Allergies  Patient Measurements: Height: 5\' 3"  (160 cm) Weight: 213 lb 4.8 oz (96.752 kg) IBW/kg (Calculated) : 56.9  Heparin dosing weight = 78.5 kg  Vital Signs: Temp: 97.8 F (36.6 C) (10/26 0539) Temp Source: Oral (10/26 0539) BP: 120/77 mmHg (10/26 0746) Pulse Rate: 84 (10/26 0746)  Labs:  Recent Labs  09/10/15 1727 09/11/15 0337  09/11/15 1530 09/11/15 2259 09/12/15 0245  HGB 10.4* 10.4*  --   --   --  10.1*  HCT 31.8* 31.9*  --   --   --  31.1*  PLT 203 217  --   --   --  194  HEPARINUNFRC  --   --   < > 0.50 0.43 0.33  CREATININE 3.38* 3.29*  --   --   --  2.50*  < > = values in this interval not displayed.  Estimated Creatinine Clearance: 34.4 mL/min (by C-G formula based on Cr of 2.5).   Medical History: Past Medical History  Diagnosis Date  . Coronary artery disease   . Diabetes mellitus without complication ([place])   . Hypertension   . CHF (congestive heart failure) ([place])   . 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 H/O tricuspid valve repair 2012  . Paroxysmal atrial fibrillation (HCC)   . Diabetes mellitus type 2 in obese ([place])   . Obstructive sleep apnea   . Chronic kidney disease (CKD), stage IV (severe) ([place])   . Anemia of chronic disease   . Pulmonary HTN (HCC)     Medications:  No anticoagulants pta  Assessment: 54yom presents to the ED with dyspnea x 2 days. He was found to be in afib ? new onset. CHADSVASC is 4. He is also in renal failure which may be chronic per nephrology. Started on IV heparin infusion protocol per pharmacy 10/24 PM. This AM the heparin level is 0.33, remains therapeutic on 1550 units/hr IV heparin for Afib with RVR. Hgb low/stable at 10.2, pltc wnl.  No bleeding noted. Cardiology PA notes this AM that patient remains in afib, rate is better controlled on Coreg 25 bid  and plans to discharge on coumadin (no order yet). Tarri Fuller PA-C also notes that patient has history of noncompliance with Coumadin and other medications.    Elevated d-dimer: VQ scan was low prob of PE LLE venous Doppler was negative for DVT Echo 09/11/15: EF 30-35%  He has CKD IV. SCr 3.29>2.5 today  Goal of Therapy:  Heparin level 0.3-0.7 units/ml Monitor platelets by anticoagulation protocol: Yes   Plan:  Continue IV Heparin drip at 1550 units/hr Daily heparin level and CBC Follow up for orders to start coumadin.  Thank you for allowing pharmacy to be part of this patients care team. Nicole Cella, RPh Clinical Pharmacist Pager: [phone] 09/12/2015,12:11 PM   ADDENDUM:  Order received to start coumadin per pharmacy protocol for Afib.  [facility] cardiology reviewed records from PCP and notes that the patient has history of noncompliance with Coumadin and other medications. Not on coumadin or anticoagulation prior to this admission.  54y.o male, wt 96.8 kg, and Afib with RVR.  Warfarin predictor points at least 8 points.    Plan:  Baseline INR prior to start of coumadin Coumadin 10mg  tonight x1 unless baseline INR elevated significantly.  Daily PT/INR Coumadin book & video for patient's education.  Nicole Cella,  RPh Clinical Pharmacist Pager: [phone] 09/12/2015 1:37 PM    Addendum:  Baseline INR is 1.18.  Plan: Proceed with coumadin dose as noted above.  Nicole Cella, RPh Clinical Pharmacist Pager: [phone] 09/12/2015 4:50 PM

## 2015-09-12 NOTE — Progress Notes (Signed)
HR increased to 100s-120s. PA made aware - asked that we page again HR in 130s. Pt resting comfortably with call bell within reach. Will continue to monitor.   Fritz Pickerel, RN

## 2015-09-12 NOTE — Progress Notes (Signed)
Subjective: Patient reports feeling better.  Some SOB but improved.   Objective: Vital signs in last 24 hours: Temp:  [97.5 F (36.4 C)-97.8 F (36.6 C)] 97.8 F (36.6 C) (10/26 0539) Pulse Rate:  [65-85] 84 (10/26 0746) Resp:  [17-18] 18 (10/26 0539) BP: (85-120)/(42-77) 120/77 mmHg (10/26 0746) SpO2:  [96 %-98 %] 98 % (10/26 0539) Weight:  [213 lb 4.8 oz (96.752 kg)] 213 lb 4.8 oz (96.752 kg) (10/26 0539) Last BM Date: 09/11/15  Intake/Output from previous day: 10/25 0701 - 10/26 0700 In: 960 [P.O.:960] Out: 100 [Urine:100] Intake/Output this shift:    Medications Scheduled Meds: . carvedilol  25 mg Oral BID WC  . furosemide  40 mg Oral Daily  . Influenza vac split quadrivalent PF  0.5 mL Intramuscular Tomorrow-1000  . insulin aspart  0-5 Units Subcutaneous QHS  . insulin aspart  0-9 Units Subcutaneous TID WC  . insulin glargine  20 Units Subcutaneous QHS  . sodium chloride  3 mL Intravenous Q12H   Continuous Infusions: . heparin 1,550 Units/hr (09/11/15 1239)   PRN Meds:.sodium chloride, acetaminophen, LORazepam, ondansetron (ZOFRAN) IV, sodium chloride, technetium TC 108M diethylenetriame-pentaacetic acid  PE: General appearance: alert, cooperative and no distress Lungs: clear to auscultation bilaterally Heart: irregularly irregular rhythm and 1/6 apical MM Extremities: No LEE Pulses: 2+ and symmetric Skin: Warm and dry Neurologic: Grossly normal  Lab Results:   Recent Labs  09/10/15 1727 09/11/15 0337 09/12/15 0245  WBC 8.0 7.7 5.5  HGB 10.4* 10.4* 10.1*  HCT 31.8* 31.9* 31.1*  PLT 203 217 194   BMET  Recent Labs  09/10/15 1727 09/11/15 0337 09/12/15 0245  NA 137 139 139  K 5.1 4.2 4.4  CL 108 105 107  CO2 21* 21* 17*  GLUCOSE 183* 204* 125*  BUN 67* 66* 59*  CREATININE 3.38* 3.29* 2.50*  CALCIUM 8.9 9.1 9.0      Assessment/Plan  Principal Problem:   Atrial fibrillation with rapid ventricular response (HCC) Active Problems:   CKD (chronic kidney disease), stage IV (HCC)   Calf pain   SOB (shortness of breath)   DM type 2 (diabetes mellitus, type 2) (HCC)   Essential hypertension  Chronic systolic /diastolic CHF     I reviewed the patient's records from his primary care office visits. 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  Echo yesterday revealed EF of 30-35%, mild LVH, MV area 0.89cm^2.  LA severely dilated.  RV mildly dilated with mild to mod reduced sys function, RA moderately dilated.   I do not know if is cardiomyopathy is an old finding or new.  Could  be tachycardia induced.  The RN is going to ask his daughter who his cardiologist was and try to get those records.    He remains is afib.  Rate is better controlled.  On Coreg 25 bid.   With his dilated atria, he probably will not maintain SR with DCCV.  I would go for rate control.   I would keep him here until INR is therapeutic.  He will need very close follow-up.     VQ scan was low prob of PE.   LOS: 2 days    HAGER, BRYAN PA-C 09/12/2015 9:09 AM  Patient seen and examined  Rates of afib  still in 100s to 110s   Pt complains of SOB   Echo as noted    Increasing Coreg to 37.5 bid   Give  Additional lasix  Strict I/O Continue anticoagulation.    [name]

## 2015-09-12 NOTE — Progress Notes (Signed)
PROGRESS NOTE    Michael [name] [id number] DOB: 05-03-1961 DOA: 09/10/2015 PCP: No PCP Per Patient  HPI/Brief narrative 54 year old [name], history of DM 2-diet controlled, HTN, chronic CHF-unknown type, CAD/CABG (2011 in [place]), tobacco abuse, reported valve disease (status post surgery- pig valve, 2011), possible chronic kidney disease, recently moved to [facility] for permanent residence, presented to [facility] ED on 09/10/15 with complaints of dyspnea, orthopnea, chest pain and left leg pain. In ED found to be with A. fib with RVR. Admitted frecords from his primary care office visits. 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 Cardiology consulted. Received records from PCP office,   Assessment/Plan:  Atrial fibrillation with RVR - History of paroxysmal A. fib in the past, noncompliance with warfarin. - Cardiology consultation appreciated: off cardizem drip, Rate now well controlled on Coreg 25 MG twice a day. CHADSVASC is 4. He is on IV heparin  until INR is therapeutic. - 2-D echo showing a severely dilated, EF 30-35%(" unclear if this is old or new, patient does not know his cardiologist name)   systolic CHF - Unclear acuity, echo showing EF 30-35%, will try to obtain results from his cardiologist, continue to hold ARB/ACT given his renal failure , will need to start on Aldactone, hydralazine and Imdur one pack pressure improves .  Uncontrolled DM 2 with renal complication - CBG is acceptable on lantus and SSI -A1c is 7.8, was 11.1 last year .   Essential hypertension/hypotension - blood pressure remains on the lower side, but tolerating Coreg and  Lasix .  Anemia microcytic - Possibly from chronic kidney diseaseiron is 56, ferritin is 124  acute on chronic kidney disease stage III - Baseline creatinine is 2.2 as reviewed per record, 3.3 on admission, improving today is 2.5 - Renal ultrasound: No hydronephrosis - continue to hold ACE inhibitor   Left foot pain/elevated d-dimer/dyspnea - VQ scan: Very low probability for PE - Left Lower extremity venous Doppler: Negative for DVT  CAD, s/p CABG, s/p AVR- pig valve - Obtain outside hospital medical records.  Tobacco abuse - Need cessation counseling.    DVT prophylaxis: Lovenox Code Status: Full Family Communication: None at bedside, to call daughter , nonworking number  Disposition Plan: EC home when medically stable   Consultants:  Cardiology  Procedures:  Left lower extremity venous Doppler 09/11/15: Vascular Ultrasound Left lower extremity venous duplex has been completed. Preliminary findings: negative for DVT.  Antibiotics:  None   Subjective: Able to communicate with some English. States that he was unable to sleep last night. Orthopnea. No chest pain.  Objective: Filed Vitals:   09/11/15 1315 09/11/15 2057 09/12/15 0539 09/12/15 0746  BP: 97/47 110/76 96/77 120/77  Pulse: 77 85 65 84  Temp: 97.8 F (36.6 C) 97.5 F (36.4 C) 97.8 F (36.6 C)   TempSrc: Oral Oral Oral   Resp: 17 18 18    Height:      Weight:   96.752 kg (213 lb 4.8 oz)   SpO2: 97% 96% 98%     Intake/Output Summary (Last 24 hours) at 09/12/15 1321 Last data filed at 09/12/15 0900  Gross per 24 hour  Intake    720 ml  Output      0 ml  Net    720 ml  Filed Weights   09/10/15 2217 09/11/15 0227 09/12/15 0539  Weight: 95.5 kg (210 lb 8.6 oz) 95.9 kg (211 lb 6.7 oz) 96.752 kg (213 lb 4.8 oz)     Exam:  General exam: Pleasant middle-aged male lying comfortably propped up in bed. Respiratory system: Clear. No increased work of breathing. Midline sternotomy  scar. Cardiovascular system: S1 & S2 heard, RRR. No JVD, murmurs, gallops, clicks or pedal edema. Telemetry: A. fib with controlled ventricular rate. Gastrointestinal system: Abdomen is nondistended, soft and nontender. Normal bowel sounds heard. Central nervous system: Alert and oriented. No focal neurological deficits. Extremities: Symmetric 5 x 5 power. No acute findings.   Data Reviewed: Basic Metabolic Panel:  Recent Labs Lab 09/10/15 1727 09/11/15 0337 09/12/15 0245  NA 137 139 139  K 5.1 4.2 4.4  CL 108 105 107  CO2 21* 21* 17*  GLUCOSE 183* 204* 125*  BUN 67* 66* 59*  CREATININE 3.38* 3.29* 2.50*  CALCIUM 8.9 9.1 9.0  MG  --  2.0  --    Liver Function Tests: No results for input(s): AST, ALT, ALKPHOS, BILITOT, PROT, ALBUMIN in the last 168 hours. No results for input(s): LIPASE, AMYLASE in the last 168 hours. No results for input(s): AMMONIA in the last 168 hours. CBC:  Recent Labs Lab 09/10/15 1727 09/11/15 0337 09/12/15 0245  WBC 8.0 7.7 5.5  HGB 10.4* 10.4* 10.1*  HCT 31.8* 31.9* 31.1*  MCV 74.1* 73.8* 74.8*  PLT 203 217 194   Cardiac Enzymes: No results for input(s): CKTOTAL, CKMB, CKMBINDEX, TROPONINI in the last 168 hours. BNP (last 3 results) No results for input(s): PROBNP in the last 8760 hours. CBG:  Recent Labs Lab 09/11/15 1210 09/11/15 1550 09/11/15 2049 09/12/15 0614 09/12/15 1139  GLUCAP 159* 164* 128* 89 146*    No results found for this or any previous visit (from the past 240 hour(s)).       Studies: US Renal  09/11/2015  CLINICAL DATA:  54 year old [name] with stage 4 chronic kidney disease. EXAM: RENAL / URINARY TRACT ULTRASOUND COMPLETE COMPARISON:  None. FINDINGS: Right Kidney: Length: Approximately 10.2 cm. No hydronephrosis. Echogenic parenchyma. No focal parenchymal abnormality. No visible shadowing calculi. Left Kidney: Length: Approximately 10.6 cm. No hydronephrosis. Echogenic parenchyma. Approximate 1.8 x 1.1 x  1.6 cm cyst arising from the upper pole. No solid parenchymal mass. Tram track calcification with shadowing, consistent with intrarenal vessels. No definite shadowing calculi. Bladder: Normal for degree of bladder distention. IMPRESSION: 1. No evidence of hydronephrosis involving either kidney. 2. Echogenic renal parenchyma as expected with the given history of stage 4 chronic kidney disease. 3. Benign approximate 2 cm cyst arising from the upper pole of the left kidney. Electronically Signed   By: Evangeline Dakin M.D.   On: 09/11/2015 10:55   Nm Pulmonary Perf And Vent  09/11/2015  CLINICAL DATA:  Shortness of Breath EXAM: NUCLEAR MEDICINE VENTILATION - PERFUSION LUNG SCAN Views: Anterior, posterior, left lateral, right lateral, RPO, LPO, RAO, LAO -ventilation and perfusion Radionuclide: Technetium 95m DTPA -ventilation; Technetium 59m macroaggregated albumin-perfusion Dose:  0000000 millicurie-ventilation; [id number] millicurie-perfusion Route of administration: Inhalation -ventilation ; intravenous -perfusion COMPARISON:  Chest radiograph September 10, 2015 FINDINGS: Ventilation: The distribution of radiotracer uptake on the ventilation study is homogeneous and symmetric bilaterally. Heart is noted to be enlarged. Perfusion: The distribution of radiotracer uptake on the perfusion study is homogeneous and symmetric bilaterally. The heart is noted to be enlarged. IMPRESSION: No appreciable ventilation or perfusion defects. This study  is felt to constitute an overall very low probability of pulmonary embolus. Cardiomegaly noted. Electronically Signed   By: Lowella Grip III M.D.   On: 09/11/2015 12:36   Dg Chest Portable 1 View  09/10/2015  CLINICAL DATA:  Shortness of Breath EXAM: PORTABLE CHEST 1 VIEW COMPARISON:  None. FINDINGS: Cardiomegaly. Status post median sternotomy. No acute infiltrate or pulmonary edema. Mild basilar atelectasis. IMPRESSION: No active disease.  Cardiomegaly.  Status post median  sternotomy. Electronically Signed   By: Lahoma Crocker M.D.   On: 09/10/2015 17:37        Scheduled Meds: . carvedilol  25 mg Oral BID WC  . furosemide  40 mg Oral Daily  . Influenza vac split quadrivalent PF  0.5 mL Intramuscular Tomorrow-1000  . insulin aspart  0-5 Units Subcutaneous QHS  . insulin aspart  0-9 Units Subcutaneous TID WC  . insulin glargine  20 Units Subcutaneous QHS  . sodium chloride  3 mL Intravenous Q12H   Continuous Infusions: . heparin 1,550 Units/hr (09/11/15 1239)    Principal Problem:   Atrial fibrillation with rapid ventricular response (HCC) Active Problems:   CKD (chronic kidney disease), stage IV (HCC)   Calf pain   SOB (shortness of breath)   DM type 2 (diabetes mellitus, type 2) ([place])   Essential hypertension    Time spent: 40 minutes.    Phillips Climes, MD,  [facility] Pager [phone]  If 7PM-7AM, please contact night-coverage [email or web address] Password [email or web address] 09/12/2015, 1:21 PM    LOS: 2 days

## 2015-09-13 DIAGNOSIS — I4891 Unspecified atrial fibrillation: Secondary | ICD-10-CM | POA: Diagnosis not present

## 2015-09-13 LAB — BASIC METABOLIC PANEL
Anion gap: 9 (ref 5–15)
BUN: 49 mg/dL — AB (ref 6–20)
CALCIUM: 9.1 mg/dL (ref 8.9–10.3)
CO2: 25 mmol/L (ref 22–32)
CREATININE: 2.28 mg/dL — AB (ref 0.61–1.24)
Chloride: 104 mmol/L (ref 101–111)
GFR calc non Af Amer: 31 mL/min — ABNORMAL LOW (ref >60)
GFR, EST AFRICAN AMERICAN: 35 mL/min — AB (ref >60)
Glucose, Bld: 117 mg/dL — ABNORMAL HIGH (ref 65–99)
Potassium: 4.2 mmol/L (ref 3.5–5.1)
SODIUM: 138 mmol/L (ref 135–145)

## 2015-09-13 LAB — HEPARIN LEVEL (UNFRACTIONATED)
HEPARIN UNFRACTIONATED: 0.29 [IU]/mL — AB (ref 0.30–0.70)
Heparin Unfractionated: 0.45 IU/mL (ref 0.30–0.70)

## 2015-09-13 LAB — CBC
HCT: 31.2 % — ABNORMAL LOW (ref 39.0–52.0)
Hemoglobin: 10.2 g/dL — ABNORMAL LOW (ref 13.0–17.0)
MCH: 24.5 pg — ABNORMAL LOW (ref 26.0–34.0)
MCHC: 32.7 g/dL (ref 30.0–36.0)
MCV: 75 fL — ABNORMAL LOW (ref 78.0–100.0)
Platelets: 211 10*3/uL (ref 150–400)
RBC: 4.16 MIL/uL — ABNORMAL LOW (ref 4.22–5.81)
RDW: 16.4 % — AB (ref 11.5–15.5)
WBC: 5.7 10*3/uL (ref 4.0–10.5)

## 2015-09-13 LAB — GLUCOSE, CAPILLARY
GLUCOSE-CAPILLARY: 97 mg/dL (ref 65–99)
GLUCOSE-CAPILLARY: 133 mg/dL — AB (ref 65–99)
Glucose-Capillary: 146 mg/dL — ABNORMAL HIGH (ref 65–99)
Glucose-Capillary: 157 mg/dL — ABNORMAL HIGH (ref 65–99)
Glucose-Capillary: 163 mg/dL — ABNORMAL HIGH (ref 65–99)

## 2015-09-13 LAB — PROTIME-INR
INR: 1.3 (ref 0.00–1.49)
PROTHROMBIN TIME: 16.3 s — AB (ref 11.6–15.2)

## 2015-09-13 LAB — BRAIN NATRIURETIC PEPTIDE: B NATRIURETIC PEPTIDE 5: 351.1 pg/mL — AB (ref 0.0–100.0)

## 2015-09-13 MED ORDER — ZOLPIDEM TARTRATE 5 MG PO TABS
5.0000 mg | ORAL_TABLET | Freq: Once | ORAL | Status: AC
  Administered 2015-09-13: 5 mg via ORAL
  Filled 2015-09-13: qty 1

## 2015-09-13 MED ORDER — WARFARIN SODIUM 7.5 MG PO TABS
7.5000 mg | ORAL_TABLET | Freq: Once | ORAL | Status: AC
  Administered 2015-09-13: 7.5 mg via ORAL
  Filled 2015-09-13: qty 1

## 2015-09-13 MED ORDER — HEPARIN (PORCINE) IN NACL 100-0.45 UNIT/ML-% IJ SOLN
1700.0000 [IU]/h | INTRAMUSCULAR | Status: DC
  Administered 2015-09-13 – 2015-09-14 (×2): 1700 [IU]/h via INTRAVENOUS
  Filled 2015-09-13 (×2): qty 250

## 2015-09-13 MED ORDER — HEPARIN BOLUS VIA INFUSION
1000.0000 [IU] | Freq: Once | INTRAVENOUS | Status: AC
  Administered 2015-09-13: 1000 [IU] via INTRAVENOUS
  Filled 2015-09-13: qty 1000

## 2015-09-13 MED ORDER — METOPROLOL TARTRATE 25 MG PO TABS
25.0000 mg | ORAL_TABLET | Freq: Two times a day (BID) | ORAL | Status: DC
  Administered 2015-09-13 – 2015-09-14 (×3): 25 mg via ORAL
  Filled 2015-09-13 (×3): qty 1

## 2015-09-13 MED ORDER — FUROSEMIDE 80 MG PO TABS
80.0000 mg | ORAL_TABLET | Freq: Every day | ORAL | Status: DC
  Administered 2015-09-14: 80 mg via ORAL
  Filled 2015-09-13: qty 1

## 2015-09-13 NOTE — Care Management Important Message (Signed)
Important Message  Patient Details  Name: Michael [name] MRN: [place]:[id number] Date of Birth: [birth year]   Medicare Important Message Given:  Yes-second notification given    Nathen May 09/13/2015, 10:32 AM

## 2015-09-13 NOTE — Progress Notes (Signed)
Subjective: Breathing is better  No CP   Objective: Filed Vitals:   09/12/15 0746 09/12/15 1351 09/12/15 1453 09/13/15 0500  BP: 120/77 121/84 118/85 120/85  Pulse: 84 108 109 105  Temp:  98.2 F (36.8 C)  97.7 F (36.5 C)  TempSrc:  Oral  Oral  Resp:  19  18  Height:      Weight:    209 lb 9.6 oz (95.074 kg)  SpO2:  99%  99%   Weight change: -3 lb 11.2 oz (-1.678 kg)  Intake/Output Summary (Last 24 hours) at 09/13/15 1014 Last data filed at 09/13/15 0003  Gross per 24 hour  Intake    484 ml  Output    720 ml  Net   -236 ml    General: Alert, awake, oriented x3, in no acute distress Neck:  JVP is normal Heart: Irregular rate and rhythm, Lungs: Clear to auscultation.  No rales or wheezes. Exemities:  No edema.   Neuro: Grossly intact, nonfocal.  Tele:  Afib  100s    Lab Results: Results for orders placed or performed during the hospital encounter of 09/10/15 (from the past 24 hour(s))  Glucose, capillary     Status: Abnormal   Collection Time: 09/12/15 11:39 AM  Result Value Ref Range   Glucose-Capillary 146 (H) 65 - 99 mg/dL   Comment 1 Notify RN    Comment 2 Document in Chart   Protime-INR     Status: None   Collection Time: 09/12/15  2:48 PM  Result Value Ref Range   Prothrombin Time 15.2 11.6 - 15.2 seconds   INR 1.18 0.00 - 1.49  Glucose, capillary     Status: Abnormal   Collection Time: 09/12/15  4:10 PM  Result Value Ref Range   Glucose-Capillary 137 (H) 65 - 99 mg/dL  Glucose, capillary     Status: Abnormal   Collection Time: 09/12/15  9:39 PM  Result Value Ref Range   Glucose-Capillary 146 (H) 65 - 99 mg/dL  Heparin level (unfractionated)     Status: Abnormal   Collection Time: 09/13/15  2:52 AM  Result Value Ref Range   Heparin Unfractionated 0.29 (L) 0.30 - 0.70 IU/mL  CBC     Status: Abnormal   Collection Time: 09/13/15  2:52 AM  Result Value Ref Range   WBC 5.7 4.0 - 10.5 K/uL   RBC 4.16 (L) 4.22 - 5.81 MIL/uL   Hemoglobin 10.2 (L) 13.0  - 17.0 g/dL   HCT 31.2 (L) 39.0 - 52.0 %   MCV 75.0 (L) 78.0 - 100.0 fL   MCH 24.5 (L) 26.0 - 34.0 pg   MCHC 32.7 30.0 - 36.0 g/dL   RDW 16.4 (H) 11.5 - 15.5 %   Platelets 211 150 - 400 K/uL  Basic metabolic panel     Status: Abnormal   Collection Time: 09/13/15  2:52 AM  Result Value Ref Range   Sodium 138 135 - 145 mmol/L   Potassium 4.2 3.5 - 5.1 mmol/L   Chloride 104 101 - 111 mmol/L   CO2 25 22 - 32 mmol/L   Glucose, Bld 117 (H) 65 - 99 mg/dL   BUN 49 (H) 6 - 20 mg/dL   Creatinine, Ser 2.28 (H) 0.61 - 1.24 mg/dL   Calcium 9.1 8.9 - 10.3 mg/dL   GFR calc non Af Amer 31 (L) >60 mL/min   GFR calc Af Amer 35 (L) >60 mL/min   Anion gap 9 5 - 15  Protime-INR     Status: Abnormal   Collection Time: 09/13/15  2:52 AM  Result Value Ref Range   Prothrombin Time 16.3 (H) 11.6 - 15.2 seconds   INR 1.30 0.00 - 1.49  Glucose, capillary     Status: Abnormal   Collection Time: 09/13/15  6:06 AM  Result Value Ref Range   Glucose-Capillary 157 (H) 65 - 99 mg/dL    Studies/Results: No results found.  Medications:Reviewed  @PROBHOSP @  1  Atrial fib  Rates are still not optimal  Still on heparin as INR not therapeutic Will add metoprolol to regimen  May transition if better neg chronotropic effect    2  Acute on chronic systolic CHF  Given IV lasix yesterday  Breathing better  Exam difficult  Check BNP  Increase daily dose po to 80  Follow Cr    3.  CKD    Follow Cr.    LOS: 3 days   Michael [name] 09/13/2015, 10:14 AM

## 2015-09-13 NOTE — Consult Note (Signed)
ANTICOAGULATION CONSULT NOTE - Follow up  Pharmacy Consult for Heparin and Coumadin Indication: atrial fibrillation  No Known Allergies  Patient Measurements: Height: 5\' 3"  (160 cm) Weight: 209 lb 9.6 oz (95.074 kg) IBW/kg (Calculated) : 56.9  Heparin dosing weight = 78.5 kg   Vital Signs: Temp: 99 F (37.2 C) (10/27 1324) Temp Source: Oral (10/27 1324) BP: 103/88 mmHg (10/27 1324) Pulse Rate: 110 (10/27 1324)  Labs:  Recent Labs  09/11/15 0337  09/12/15 0245 09/12/15 1448 09/13/15 0252 09/13/15 1435  HGB 10.4*  --  10.1*  --  10.2*  --   HCT 31.9*  --  31.1*  --  31.2*  --   PLT 217  --  194  --  211  --   LABPROT  --   --   --  15.2 16.3*  --   INR  --   --   --  1.18 1.30  --   HEPARINUNFRC  --   < > 0.33  --  0.29* 0.45  CREATININE 3.29*  --  2.50*  --  2.28*  --   < > = values in this interval not displayed.  Estimated CrCl: 25- 35 ml /min   Warfarin:  Date 10/26 10/27  INR 10 mg  (day 1) 7.5 mg   Wafarin dose 1.18 1.3    Heparin: 10/27 1435 HL 0.45 on 1700 units/hr  - continued  10/27 0252 HL 0.29: 1000 unit bolus and increased rate to 1700 units/hr 10/26 0245 HL 0.33: heparin rate at 1550 units/hr - continued 10/25 2259 HL 0.43: heparin rate at 1550 units/hr - continued  10/25 1530 HL 0.17: heparin rate at 1350 units/hr - bolus 3000 units and rate inc to 1550 units/hr  Hgb low/stable at 10.2, pltc wnl.  No bleeding noted. Cardiology  PA-C also notes that patient has history of noncompliance with Coumadin and other medications.    Assessment: 54yom presented to the ED on 09/10/15 with dyspnea x 2 days. He was found to be in afib ? new onset. CHADSVASC is 4. He was also in renal failure , CKD stage IV. Started on IV heparin infusion protocol per pharmacy 10/24 PM.    VQ scan was low prob of PE LLE venous Doppler was negative for DVT Echo 09/11/15: EF 30-35%  He has CKD IV. 2.28< 2.5 < 3.29  Goal of Therapy:  Heparin level 0.3-0.7  units/ml Monitor platelets by anticoagulation protocol: Yes   Plan:   - HL within desired range will continue Heparin drip at 1700 units/hr  - Daily HL, CBC, INR while on warfarin and hep gtt  -  Following daily   Thank you for allowing pharmacy to be part of this patients care team.  Vincenza Hews, PharmD, BCPS 09/13/2015, 3:45 PM Pager: [phone]

## 2015-09-13 NOTE — Consult Note (Signed)
ANTICOAGULATION CONSULT NOTE - Follow up  Pharmacy Consult for Heparin and Coumadin Indication: atrial fibrillation  No Known Allergies  Patient Measurements: Height: 5\' 3"  (160 cm) Weight: 209 lb 9.6 oz (95.074 kg) IBW/kg (Calculated) : 56.9  Heparin dosing weight = 78.5 kg  Vital Signs: Temp: 97.7 F (36.5 C) (10/27 0500) Temp Source: Oral (10/27 0500) BP: 120/85 mmHg (10/27 0500) Pulse Rate: 105 (10/27 0500)  Labs:  Recent Labs  09/11/15 0337  09/11/15 2259 09/12/15 0245 09/12/15 1448 09/13/15 0252  HGB 10.4*  --   --  10.1*  --  10.2*  HCT 31.9*  --   --  31.1*  --  31.2*  PLT 217  --   --  194  --  211  LABPROT  --   --   --   --  15.2 16.3*  INR  --   --   --   --  1.18 1.30  HEPARINUNFRC  --   < > 0.43 0.33  --  0.29*  CREATININE 3.29*  --   --  2.50*  --  2.28*  < > = values in this interval not displayed.  Estimated Creatinine Clearance: 37.4 mL/min (by C-G formula based on Cr of 2.28).   Medical History: Past Medical History  Diagnosis Date  . Coronary artery disease   . Diabetes mellitus without complication ([place])   . Hypertension   . CHF (congestive heart failure) ([place])   . 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 H/O tricuspid valve repair 2012  . Paroxysmal atrial fibrillation (HCC)   . Diabetes mellitus type 2 in obese ([place])   . Obstructive sleep apnea   . Chronic kidney disease (CKD), stage IV (severe) ([place])   . Anemia of chronic disease   . Pulmonary HTN (HCC)     Medications:  No anticoagulants pta  Assessment: 54yom presented to the ED on 09/10/15 with dyspnea x 2 days. He was found to be in afib ? new onset. CHADSVASC is 4. He was also in renal failure , CKD stage IV. Started on IV heparin infusion protocol per pharmacy 10/24 PM.  Today  heparin level is decreased to 0.29 from 0.33 on 1550 units/hr IV heparin for Afib with RVR. Coumadin started yesterday 10/26.Baseline INR 1.18, INR today = 1.3 Hgb low/stable at 10.2,  pltc wnl.  No bleeding noted. Cardiology  PA-C also notes that patient has history of noncompliance with Coumadin and other medications.    Elevated d-dimer: VQ scan was low prob of PE LLE venous Doppler was negative for DVT Echo 09/11/15: EF 30-35%  He has CKD IV. SCr 3.29>2.5 >2.28   Goal of Therapy:  Heparin level 0.3-0.7 units/ml Monitor platelets by anticoagulation protocol: Yes   Plan:  Give heparin 1000 units IV bolus x1 Increase Heparin drip to 1700 units/hr Check heparin level in 6 hrs Coumadin 7.5 mg po today x1 Daily heparin level , CBC and INR   Thank you for allowing pharmacy to be part of this patients care team. Nicole Cella, RPh Clinical Pharmacist Pager: [phone] 09/13/2015,8:26 AM

## 2015-09-13 NOTE — Progress Notes (Signed)
PROGRESS NOTE    Michael [name] Memos [id number] DOB: 12/16/1960 DOA: 09/10/2015 PCP: No PCP Per Patient  HPI/Brief narrative 54 year old [name], history of DM 2-diet controlled, HTN, chronic CHF-unknown type, CAD/CABG (2011 in [place]), tobacco abuse, reported valve disease (status post surgery- pig valve, 2011), possible chronic kidney disease, recently moved to [facility] for permanent residence, presented to [facility] ED on 09/10/15 with complaints of dyspnea, orthopnea, chest pain and left leg pain. In ED found to be with A. fib with RVR. Admitted frecords from his primary care office visits. 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 Cardiology consulted. Received records from PCP office,   Assessment/Plan:  Atrial fibrillation with RVR - History of paroxysmal A. fib in the past, noncompliance with warfarin. - Cardiology consultation appreciated: off cardizem drip , heart rate still not optimal, managed by cardiology and they added metoprolol to Coreg,  -  CHADSVASC is 4. He is on IV heparin  until INR is therapeutic. - 2-D echo showing a severely dilated left at p.m., EF 30-35%(" unclear if this is old or new, patient does not know his cardiologist name)   Acute on chronic systolic CHF - Unclear acuity, echo showing EF 30-35%, continue to hold ARB/ACT given his renal failure , will need to start on Aldactone, hydralazine and Imdur were blood pressure is more stable . - Will be placed on fluid restriction as he reports large fluid intake over last 24 hours(unclear if documented or not), educated about that, volume status appears to be better after receiving IV  Lasix yesterday, oral Lasix was increased by cardiology.  Uncontrolled DM 2 with renal complication - CBG is acceptable on lantus and SSI -A1c is 7.8, was 11.1 last year .   Essential hypertension/hypotension - blood pressure is acceptable..  Anemia microcytic - Possibly from chronic kidney disease , iron is 56, ferritin is 124  acute on chronic kidney disease stage III - Baseline creatinine is 2.2 as reviewed per record, 3.3 on admission, improving today is 2.2 - Renal ultrasound: No hydronephrosis - continue to hold ACE inhibitor   Left foot pain/elevated d-dimer/dyspnea - VQ scan: Very low probability for PE - Left Lower extremity venous Doppler: Negative for DVT  CAD, s/p CABG, s/p AVR- pig valve - Obtained from  outside hospital medical records.  Tobacco abuse - Need cessation counseling.    DVT prophylaxis: Lovenox Code Status: Full Family Communication: None at bedside,  Disposition Plan: DC home when medically stable   Consultants:  Cardiology  Procedures:  Left lower extremity venous Doppler 09/11/15: Vascular Ultrasound Left lower extremity venous duplex has been completed. Preliminary findings: negative for DVT.  Antibiotics:  None   Subjective: Able to communicate with some English. States that he was unable to sleep last night. Orthopnea. No chest pain.  Objective: Filed Vitals:   09/12/15 0746 09/12/15 1351 09/12/15 1453 09/13/15 0500  BP: 120/77 121/84 118/85 120/85  Pulse: 84 108 109 105  Temp:  98.2 F (36.8 C)  97.7 F (36.5 C)  TempSrc:  Oral  Oral  Resp:  19  18  Height:      Weight:    95.074 kg (209 lb 9.6 oz)  SpO2:  99%  99%    Intake/Output Summary (Last 24 hours) at 09/13/15 1144 Last data  filed at 09/13/15 0003  Gross per 24 hour  Intake    484 ml  Output    720 ml  Net   -236 ml   Filed Weights   09/11/15 0227 09/12/15 0539 09/13/15 0500  Weight: 95.9 kg (211 lb 6.7 oz) 96.752 kg (213 lb 4.8 oz) 95.074 kg (209 lb  9.6 oz)     Exam:  General exam: Pleasant middle-aged male lying comfortably propped up in bed. Respiratory system: Clear. No increased work of breathing. Midline sternotomy scar. Cardiovascular system: S1 & S2 heard, RRR. No JVD, murmurs, gallops, clicks or pedal edema.  Gastrointestinal system: Abdomen is nondistended, soft and nontender. Normal bowel sounds heard. Central nervous system: Alert and oriented. No focal neurological deficits. Extremities: Symmetric 5 x 5 power. No acute findings.   Data Reviewed: Basic Metabolic Panel:  Recent Labs Lab 09/10/15 1727 09/11/15 0337 09/12/15 0245 09/13/15 0252  NA 137 139 139 138  K 5.1 4.2 4.4 4.2  CL 108 105 107 104  CO2 21* 21* 17* 25  GLUCOSE 183* 204* 125* 117*  BUN 67* 66* 59* 49*  CREATININE 3.38* 3.29* 2.50* 2.28*  CALCIUM 8.9 9.1 9.0 9.1  MG  --  2.0  --   --    Liver Function Tests: No results for input(s): AST, ALT, ALKPHOS, BILITOT, PROT, ALBUMIN in the last 168 hours. No results for input(s): LIPASE, AMYLASE in the last 168 hours. No results for input(s): AMMONIA in the last 168 hours. CBC:  Recent Labs Lab 09/10/15 1727 09/11/15 0337 09/12/15 0245 09/13/15 0252  WBC 8.0 7.7 5.5 5.7  HGB 10.4* 10.4* 10.1* 10.2*  HCT 31.8* 31.9* 31.1* 31.2*  MCV 74.1* 73.8* 74.8* 75.0*  PLT 203 217 194 211   Cardiac Enzymes: No results for input(s): CKTOTAL, CKMB, CKMBINDEX, TROPONINI in the last 168 hours. BNP (last 3 results) No results for input(s): PROBNP in the last 8760 hours. CBG:  Recent Labs Lab 09/12/15 1139 09/12/15 1610 09/12/15 2139 09/13/15 0606 09/13/15 1135  GLUCAP 146* 137* 146* 157* 163*    No results found for this or any previous visit (from the past 240 hour(s)).       Studies: Nm Pulmonary Perf And Vent  09/11/2015  CLINICAL DATA:  Shortness of Breath EXAM: NUCLEAR MEDICINE VENTILATION - PERFUSION LUNG SCAN Views: Anterior, posterior, left lateral, right lateral, RPO, LPO, RAO,  LAO -ventilation and perfusion Radionuclide: Technetium 43m DTPA -ventilation; Technetium 43m macroaggregated albumin-perfusion Dose:  0000000 millicurie-ventilation; 99991111 millicurie-perfusion Route of administration: Inhalation -ventilation ; intravenous -perfusion COMPARISON:  Chest radiograph September 10, 2015 FINDINGS: Ventilation: The distribution of radiotracer uptake on the ventilation study is homogeneous and symmetric bilaterally. Heart is noted to be enlarged. Perfusion: The distribution of radiotracer uptake on the perfusion study is homogeneous and symmetric bilaterally. The heart is noted to be enlarged. IMPRESSION: No appreciable ventilation or perfusion defects. This study is felt to constitute an overall very low probability of pulmonary embolus. Cardiomegaly noted. Electronically Signed   By: Lowella Grip III M.D.   On: 09/11/2015 12:36        Scheduled Meds: . carvedilol  37.5 mg Oral BID WC  . [START ON 09/14/2015] furosemide  80 mg Oral Daily  . insulin aspart  0-5 Units Subcutaneous QHS  . insulin aspart  0-9 Units Subcutaneous TID WC  . insulin glargine  20 Units Subcutaneous QHS  . metoprolol tartrate  25 mg Oral BID  . sodium chloride  3 mL Intravenous Q12H  .  warfarin  7.5 mg Oral ONCE-1800  . Warfarin - Pharmacist Dosing Inpatient   Does not apply q1800   Continuous Infusions: . heparin 1,700 Units/hr (09/13/15 0945)    Principal Problem:   Atrial fibrillation with rapid ventricular response (HCC) Active Problems:   CKD (chronic kidney disease), stage IV (HCC)   Calf pain   SOB (shortness of breath)   DM type 2 (diabetes mellitus, type 2) (HCC)   Essential hypertension   Acute on chronic systolic CHF (congestive heart failure) (HCC)   Mitral valve disease    Time spent: 40 minutes.    Phillips Climes, MD,  [facility] Pager [phone]  If 7PM-7AM, please contact night-coverage [email or web address] Password [email or web address] 09/13/2015, 11:44 AM    LOS: 3  days

## 2015-09-14 ENCOUNTER — Other Ambulatory Visit: Payer: Self-pay | Admitting: Physician Assistant

## 2015-09-14 DIAGNOSIS — I4891 Unspecified atrial fibrillation: Secondary | ICD-10-CM

## 2015-09-14 LAB — CBC
HCT: 30.8 % — ABNORMAL LOW (ref 39.0–52.0)
Hemoglobin: 9.9 g/dL — ABNORMAL LOW (ref 13.0–17.0)
MCH: 24 pg — AB (ref 26.0–34.0)
MCHC: 32.1 g/dL (ref 30.0–36.0)
MCV: 74.6 fL — AB (ref 78.0–100.0)
PLATELETS: 219 10*3/uL (ref 150–400)
RBC: 4.13 MIL/uL — AB (ref 4.22–5.81)
RDW: 16.6 % — AB (ref 11.5–15.5)
WBC: 5.7 10*3/uL (ref 4.0–10.5)

## 2015-09-14 LAB — BASIC METABOLIC PANEL
Anion gap: 14 (ref 5–15)
BUN: 40 mg/dL — AB (ref 6–20)
CALCIUM: 9.6 mg/dL (ref 8.9–10.3)
CHLORIDE: 101 mmol/L (ref 101–111)
CO2: 26 mmol/L (ref 22–32)
CREATININE: 2.08 mg/dL — AB (ref 0.61–1.24)
GFR calc Af Amer: 40 mL/min — ABNORMAL LOW (ref >60)
GFR calc non Af Amer: 34 mL/min — ABNORMAL LOW (ref >60)
GLUCOSE: 157 mg/dL — AB (ref 65–99)
Potassium: 4.2 mmol/L (ref 3.5–5.1)
Sodium: 141 mmol/L (ref 135–145)

## 2015-09-14 LAB — PROTIME-INR
INR: 1.45 (ref 0.00–1.49)
PROTHROMBIN TIME: 17.7 s — AB (ref 11.6–15.2)

## 2015-09-14 LAB — GLUCOSE, CAPILLARY
Glucose-Capillary: 127 mg/dL — ABNORMAL HIGH (ref 65–99)
Glucose-Capillary: 193 mg/dL — ABNORMAL HIGH (ref 65–99)

## 2015-09-14 LAB — HEPARIN LEVEL (UNFRACTIONATED): Heparin Unfractionated: 0.4 IU/mL (ref 0.30–0.70)

## 2015-09-14 MED ORDER — FUROSEMIDE 80 MG PO TABS: 80.0000 mg | ORAL_TABLET | Freq: Every day | ORAL | Status: DC

## 2015-09-14 MED ORDER — WARFARIN SODIUM 7.5 MG PO TABS: 7.5000 mg | ORAL_TABLET | Freq: Once | ORAL | Status: DC

## 2015-09-14 MED ORDER — METOPROLOL TARTRATE 100 MG PO TABS: 100.0000 mg | ORAL_TABLET | Freq: Two times a day (BID) | ORAL | Status: DC

## 2015-09-14 MED ORDER — WARFARIN SODIUM 5 MG PO TABS: 5.0000 mg | ORAL_TABLET | Freq: Every day | ORAL | Status: DC

## 2015-09-14 MED ORDER — INSULIN GLARGINE 100 UNIT/ML ~~LOC~~ SOLN: 20.0000 [IU] | Freq: Every day | SUBCUTANEOUS | Status: DC

## 2015-09-14 NOTE — Care Management Note (Signed)
Case Management Note Marvetta Gibbons RN, BSN Unit 2W-Case Manager [phone]  Patient Details  Name: Michael [name] MRN: [id number] Date of Birth: 14-Jan-1961  Subjective/Objective:   Pt admitted with afib/HF                 Action/Plan: PTA pt lived at home alone, has been seen at [facility] in past- NCM to follow for d/c needs and f/u when medically stable   Expected Discharge Date:                  Expected Discharge Plan:  Home/Self Care  In-House Referral:     Discharge planning Services  CM Consult  Post Acute Care Choice:    Choice offered to:     DME Arranged:    DME Agency:     HH Arranged:    [place] Agency:     Status of Service:  In process, will continue to follow  Medicare Important Message Given:  Yes-second notification given Date Medicare IM Given:    Medicare IM give by:    Date Additional Medicare IM Given:    Additional Medicare Important Message give by:     If discussed at [place] of Stay Meetings, dates discussed:    Additional Comments:  Dawayne Patricia, RN 09/14/2015, 11:12 AM

## 2015-09-14 NOTE — Care Management Note (Signed)
Case Management Note Marvetta Gibbons RN, BSN Unit 2W-Case Manager [phone]  Patient Details  Name: Michael [name] MRN: [id number] Date of Birth: [birth year]  Subjective/Objective:   Pt admitted with afib/HF                 Action/Plan: PTA pt lived at home alone, has been seen [facility] in past- NCM to follow for d/c needs and f/u when medically stable   Expected Discharge Date:                  Expected Discharge Plan:  Home/Self Care  In-House Referral:  PCP / [facility]  Discharge planning Services  CM Consult  Post Acute Care Choice:    Choice offered to:     DME Arranged:    DME Agency:     HH Arranged:    [place] Agency:     Status of Service:  Completed, signed off  Medicare Important Message Given:  Yes-third notification given Date Medicare IM Given:    Medicare IM give by:    Date Additional Medicare IM Given:    Additional Medicare Important Message give by:     If discussed at [place] of Stay Meetings, dates discussed:    Additional Comments:  09/14/15- [facility] # provided for PCP needs- will f/u with coumadin clinic for INR  Dahlia Client Romeo Rabon, RN 09/14/2015, 2:42 PM

## 2015-09-14 NOTE — Progress Notes (Signed)
Subjective: No CP  No SOB   Objective: Filed Vitals:   09/13/15 1324 09/13/15 2044 09/14/15 0450 09/14/15 0634  BP: 103/88 116/81 134/110 110/76  Pulse: 110 89 117 104  Temp: 99 F (37.2 C) 98.4 F (36.9 C) 98.6 F (37 C)   TempSrc: Oral Oral Oral   Resp: 17 16 18    Height:      Weight:   209 lb 11.2 oz (95.119 kg)   SpO2: 97% 98% 98%    Weight change: 1.6 oz (0.045 kg)  Intake/Output Summary (Last 24 hours) at 09/14/15 0848 Last data filed at 09/14/15 0801  Gross per 24 hour  Intake 1538.88 ml  Output      0 ml  Net 1538.88 ml    I/O   + 2 L  Not complete    General: Alert, awake, oriented x3, in no acute distress Neck:  JVP is normal Heart: Irregular rate and rhythm, without murmurs, rubs, gallops.  Lungs: Clear to auscultation.  No rales or wheezes. Exemities:  No edema.   Neuro: Grossly intact, nonfocal.  Tele:  Afib 90s to 100s   Lab Results: Results for orders placed or performed during the hospital encounter of 09/10/15 (from the past 24 hour(s))  Glucose, capillary     Status: Abnormal   Collection Time: 09/13/15 11:35 AM  Result Value Ref Range   Glucose-Capillary 163 (H) 65 - 99 mg/dL   Comment 1 Notify RN    Comment 2 Document in Chart   Brain natriuretic peptide     Status: Abnormal   Collection Time: 09/13/15 12:15 PM  Result Value Ref Range   B Natriuretic Peptide 351.1 (H) 0.0 - 100.0 pg/mL  Heparin level (unfractionated)     Status: None   Collection Time: 09/13/15  2:35 PM  Result Value Ref Range   Heparin Unfractionated 0.45 0.30 - 0.70 IU/mL  Glucose, capillary     Status: None   Collection Time: 09/13/15  4:43 PM  Result Value Ref Range   Glucose-Capillary 97 65 - 99 mg/dL   Comment 1 Notify RN    Comment 2 Document in Chart   Glucose, capillary     Status: Abnormal   Collection Time: 09/13/15  8:48 PM  Result Value Ref Range   Glucose-Capillary 133 (H) 65 - 99 mg/dL  CBC     Status: Abnormal   Collection Time: 09/14/15  3:50 AM    Result Value Ref Range   WBC 5.7 4.0 - 10.5 K/uL   RBC 4.13 (L) 4.22 - 5.81 MIL/uL   Hemoglobin 9.9 (L) 13.0 - 17.0 g/dL   HCT 30.8 (L) 39.0 - 52.0 %   MCV 74.6 (L) 78.0 - 100.0 fL   MCH 24.0 (L) 26.0 - 34.0 pg   MCHC 32.1 30.0 - 36.0 g/dL   RDW 16.6 (H) 11.5 - 15.5 %   Platelets 219 150 - 400 K/uL  Protime-INR     Status: Abnormal   Collection Time: 09/14/15  3:50 AM  Result Value Ref Range   Prothrombin Time 17.7 (H) 11.6 - 15.2 seconds   INR 1.45 0.00 - 99991111  Basic metabolic panel     Status: Abnormal   Collection Time: 09/14/15  3:50 AM  Result Value Ref Range   Sodium 141 135 - 145 mmol/L   Potassium 4.2 3.5 - 5.1 mmol/L   Chloride 101 101 - 111 mmol/L   CO2 26 22 - 32 mmol/L   Glucose, Bld 157 (  H) 65 - 99 mg/dL   BUN 40 (H) 6 - 20 mg/dL   Creatinine, Ser 2.08 (H) 0.61 - 1.24 mg/dL   Calcium 9.6 8.9 - 10.3 mg/dL   GFR calc non Af Amer 34 (L) >60 mL/min   GFR calc Af Amer 40 (L) >60 mL/min   Anion gap 14 5 - 15  Heparin level (unfractionated)     Status: None   Collection Time: 09/14/15  3:50 AM  Result Value Ref Range   Heparin Unfractionated 0.40 0.30 - 0.70 IU/mL  Glucose, capillary     Status: Abnormal   Collection Time: 09/14/15  6:21 AM  Result Value Ref Range   Glucose-Capillary 127 (H) 65 - 99 mg/dL    Studies/Results: No results found.  Medications: Reviewed  @PROBHOSP @  1  Atrial fib  Rates have been diffiuclt to control  Continue coumadin I have spoken with pharmacy  25 metoprolol is like 12.5 of coreg Wll switch to 100 bid metoprolol  Follow HR with holter as outpt.     2  Chronic systolkic CHF  Volume status is not bad.   WIll follow as outpt    LOS: 4 days   [name] 09/14/2015, 8:48 AM

## 2015-09-14 NOTE — Progress Notes (Signed)
Follow up appts  Coumadin clinic Tues September 18 2015 at 2:30 PM  [facility].   [place]  APpt with October 01, 2015 Michael [name] ([place]) at [facility]

## 2015-09-14 NOTE — Care Management Important Message (Signed)
Important Message  Patient Details  Name: Michael [name] MRN: [id number] Date of Birth: [birth year]   Medicare Important Message Given:  Yes-third notification given    Nathen May 09/14/2015, 11:29 AM

## 2015-09-14 NOTE — Discharge Instructions (Signed)
Please keep your appointments with cardiology clinic, warfarin clinic as scheduled. -   Get CBC, CMP, 2 view Chest X ray checked  by Primary MD next visit.    Activity: As tolerated with Full fall precautions use walker/cane & assistance as needed   Disposition Home    Diet: Heart Healthy  , with feeding assistance and aspiration precautions.  For Heart failure patients - Check your Weight same time everyday, if you gain over 2 pounds, or you develop in leg swelling, experience more shortness of breath or chest pain, call your Primary MD immediately. Follow Cardiac Low Salt Diet and 1.5 lit/day fluid restriction.   On your next visit with your primary care physician please Get Medicines reviewed and adjusted.   Please request your Prim.MD to go over all Hospital Tests and Procedure/Radiological results at the follow up, please get all Hospital records sent to your Prim MD by signing hospital release before you go home.   If you experience worsening of your admission symptoms, develop shortness of breath, life threatening emergency, suicidal or homicidal thoughts you must seek medical attention immediately by calling 911 or calling your MD immediately  if symptoms less severe.  You Must read complete instructions/literature along with all the possible adverse reactions/side effects for all the Medicines you take and that have been prescribed to you. Take any new Medicines after you have completely understood and accpet all the possible adverse reactions/side effects.   Do not drive, operating heavy machinery, perform activities at heights, swimming or participation in water activities or provide baby sitting services if your were admitted for syncope or siezures until you have seen by Primary MD or a Neurologist and advised to do so again.  Do not drive when taking Pain medications.    Do not take more than prescribed Pain, Sleep and Anxiety Medications  Special Instructions: If you  have smoked or chewed Tobacco  in the last 2 yrs please stop smoking, stop any regular Alcohol  and or any Recreational drug use.  Wear Seat belts while driving.   Please note  You were cared for by a hospitalist during your hospital stay. If you have any questions about your discharge medications or the care you received while you were in the hospital after you are discharged, you can call the unit and asked to speak with the hospitalist on call if the hospitalist that took care of you is not available. Once you are discharged, your primary care physician will handle any further medical issues. Please note that NO REFILLS for any discharge medications will be authorized once you are discharged, as it is imperative that you return to your primary care physician (or establish a relationship with a primary care physician if you do not have one) for your aftercare needs so that they can reassess your need for medications and monitor your lab values.  Information on my medicine - Coumadin   (Warfarin)  This medication education was reviewed with me or my healthcare representative as part of my discharge preparation.  The pharmacist that spoke with me during my hospital stay was:  Romona Curls, [facility]  Why was Coumadin prescribed for you? Coumadin was prescribed for you because you have a blood clot or a medical condition that can cause an increased risk of forming blood clots. Blood clots can cause serious health problems by blocking the flow of blood to the heart, lung, or brain. Coumadin can prevent harmful blood clots from forming. As a  reminder your indication for Coumadin is:   Select from menu  What test will check on my response to Coumadin? While on Coumadin (warfarin) you will need to have an INR test regularly to ensure that your dose is keeping you in the desired range. The INR (international normalized ratio) number is calculated from the result of the laboratory test called prothrombin  time (PT).  If an INR APPOINTMENT HAS NOT ALREADY BEEN MADE FOR YOU please schedule an appointment to have this lab work done by your health care provider within 7 days. Your INR goal is usually a number between:  2 to 3 or your provider may give you a more narrow range like 2-2.5.  Ask your health care provider during an office visit what your goal INR is.  What  do you need to  know  About  COUMADIN? Take Coumadin (warfarin) exactly as prescribed by your healthcare provider about the same time each day.  DO NOT stop taking without talking to the doctor who prescribed the medication.  Stopping without other blood clot prevention medication to take the place of Coumadin may increase your risk of developing a new clot or stroke.  Get refills before you run out.  What do you do if you miss a dose? If you miss a dose, take it as soon as you remember on the same day then continue your regularly scheduled regimen the next day.  Do not take two doses of Coumadin at the same time.  Important Safety Information A possible side effect of Coumadin (Warfarin) is an increased risk of bleeding. You should call your healthcare provider right away if you experience any of the following: ? Bleeding from an injury or your nose that does not stop. ? Unusual colored urine (red or dark brown) or unusual colored stools (red or black). ? Unusual bruising for unknown reasons. ? A serious fall or if you hit your head (even if there is no bleeding).  Some foods or medicines interact with Coumadin (warfarin) and might alter your response to warfarin. To help avoid this: ? Eat a balanced diet, maintaining a consistent amount of Vitamin K. ? Notify your provider about major diet changes you plan to make. ? Avoid alcohol or limit your intake to 1 drink for women and 2 drinks for men per day. (1 drink is 5 oz. wine, 12 oz. beer, or 1.5 oz. liquor.)  Make sure that ANY health care provider who prescribes medication for  you knows that you are taking Coumadin (warfarin).  Also make sure the healthcare provider who is monitoring your Coumadin knows when you have started a new medication including herbals and non-prescription products.  Coumadin (Warfarin)  Major Drug Interactions  Increased Warfarin Effect Decreased Warfarin Effect  Alcohol (large quantities) Antibiotics (esp. Septra/Bactrim, Flagyl, Cipro) Amiodarone (Cordarone) Aspirin (ASA) Cimetidine (Tagamet) Megestrol (Megace) NSAIDs (ibuprofen, naproxen, etc.) Piroxicam (Feldene) Propafenone (Rythmol SR) Propranolol (Inderal) Isoniazid (INH) Posaconazole (Noxafil) Barbiturates (Phenobarbital) Carbamazepine (Tegretol) Chlordiazepoxide (Librium) Cholestyramine (Questran) Griseofulvin Oral Contraceptives Rifampin Sucralfate (Carafate) Vitamin K   Coumadin (Warfarin) Major Herbal Interactions  Increased Warfarin Effect Decreased Warfarin Effect  Garlic Ginseng Ginkgo biloba Coenzyme Q10 Green tea [place] wort    Coumadin (Warfarin) FOOD Interactions  Eat a consistent number of servings per week of foods HIGH in Vitamin K (1 serving =  cup)  Collards (cooked, or boiled & drained) Kale (cooked, or boiled & drained) Mustard greens (cooked, or boiled & drained) Parsley *serving size only =  cup °Spinach (cooked, or boiled & drained) °Swiss chard (cooked, or boiled & drained) °Turnip greens (cooked, or boiled & drained)  °Eat a consistent number of servings per week of foods MEDIUM-HIGH in Vitamin K °(1 serving = 1 cup)  °Asparagus (cooked, or boiled & drained) °Broccoli (cooked, boiled & drained, or raw & chopped) °Brussel sprouts (cooked, or boiled & drained) *serving size only = ½ cup °Lettuce, raw (green leaf, endive, romaine) °Spinach, raw °Turnip greens, raw & chopped  ° °These websites have more information on Coumadin (warfarin):  www.coumadin.com; °www.ahrq.gov/consumer/coumadin.htm; ° ° ° ° °

## 2015-09-14 NOTE — Discharge Summary (Signed)
Michael [name], is a 54 y.o. male  DOB [birth year]  MRN [id number].  Admission date:  09/10/2015  Admitting Physician  Roney Jaffe, MD  Discharge Date:  09/14/2015   Primary MD  No PCP Per Patient  Recommendations for primary care physician for things to follow:  - Patient to follow with cardiology on scheduled appointment. - Sent to follow with CHF [facility] warfarin clinic this coming Tuesday regarding warfarin monitoring, will be discharged on warfarin 5 mg oral daily.   Admission Diagnosis  Dyspnea [R06.00] Atrial fibrillation, unspecified type ([place]) [I48.91]   Discharge Diagnosis  Dyspnea [R06.00] Atrial fibrillation, unspecified type ([place]) [I48.91]    Principal Problem:   Atrial fibrillation with rapid ventricular response (HCC) Active Problems:   CKD (chronic kidney disease), stage IV (HCC)   Calf pain   SOB (shortness of breath)   DM type 2 (diabetes mellitus, type 2) (HCC)   Essential hypertension   Acute on chronic systolic CHF (congestive heart failure) ([place])   Mitral valve disease      Past Medical History  Diagnosis Date  . Coronary artery disease   . Diabetes mellitus without complication ([place])   . Hypertension   . CHF (congestive heart failure) ([place])   . 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 H/O tricuspid valve repair 2012  . Paroxysmal atrial fibrillation (HCC)   . Diabetes mellitus type 2 in obese ([place])   . Obstructive sleep apnea   . Chronic kidney disease (CKD), stage IV (severe) ([place])   . Anemia of chronic disease   . Pulmonary HTN ([place])     Past Surgical History  Procedure Laterality Date  . Cardiac surgery         History of present illness and  Hospital Course:     Kindly see H&P for history of present illness and admission details, please review complete Labs, Consult reports and Test reports for all details in brief  HPI  from the  history and physical done on the day of admission Michael [name] is a 54 y.o. male with hx of CAD/ CABG, DM2 diet controlled, HTN , CHF who presented to ED today with SOB for hte last 2 days, felt cold yesterday. Has productive cough, pain left calf w some swelling. Increased abdominal swelling to abdomen and legs, cannot breathe lying down. In ED is in afib with RVR 115, normal BP's, normal SaO2 and creat 3.3. No distress.   Family interprets, he is [name]. Daughter doesn't know his medical history but has some papers at home and will bring them in tomorrow. She knows he's had bypass surg and a pig valve in 2011 in [place]. Pt lives in [place] and just moved to [place] for permanent residence. She says he has been seeing doctors every month or so. She said one of her uncles told her mother that Mr Vaynshteyn has "kidney problems" but she has never directly heard of that. She believes she has heard something about atrial fibrillation in his past as well.  He is not on any anticoagulation, he is on Coreg. He takes insulin 20 units daily which didn't make the home med list.     Hospital Course  54 year old [name], history of DM 2-diet controlled, HTN, chronic CHF-unknown type, CAD/CABG (2011 in [place]), tobacco abuse, reported valve disease (status post surgery- pig valve, 2011), possible chronic kidney disease, recently moved to [facility] for permanent residence, presented to [facility] ED on 09/10/15 with complaints of dyspnea, orthopnea, chest pain and left leg pain. In ED found to be with A. fib with RVR. Admitted frecords from his primary care office visits. Patient has a history of methamphetamine use, mitral valve replacement with porcine valve-~2012, tricuspid valve repair, paroxysmal atrial fibrillation, noncompliance with Coumadin and other medications, chronic kidney disease, obstructive sleep apnea, uncontrolled diabetes(A1c in 2015 was 11.1), anemia, hypertension, pulmonary  hypertension, hyperlipidemiaor management. Cardiology consulted.   Atrial fibrillation with RVR - History of paroxysmal A. fib in the past, noncompliance with warfarin. - Cardiology consultation appreciated: off cardizem drip , carvedilol was stopped, started on metoprolol 100 mg oral twice a day. - CHADSVASC is 4. Resume warfarin, was on IV heparin during hospital stay, INR is 1.45 on discharge. - Old discharged on 5 mg warfarin daily until seen by warfarin clinic 11/1 - 2-D echo showing a severely dilated left at p.m., EF 30-35%  Acute on chronic systolic CHF - Unclear acuity, echo showing EF 30-35%, started on low doses lisinopril - Lasix was increased to 80 mg oral daily  Uncontrolled DM 2 with renal complication - CBG is acceptable on lantus and SSI during hospital stay, will discharge on Lantus. -A1c is 7.8, was 11.1 last year .   Essential hypertension/hypotension - blood pressure is acceptable..  Anemia microcytic - Possibly from chronic kidney disease , iron is 56, ferritin is 124  acute on chronic kidney disease stage III - Baseline creatinine is 2.2 as reviewed per record, 3.3 on admission, improving today is 2 today - Renal ultrasound: No hydronephrosis - lisinopril  resumed on discharge given his renal function back to baselineil   Left foot pain/elevated d-dimer/dyspnea - VQ scan: Very low probability for PE - Left Lower extremity venous Doppler: Negative for DVT  CAD, s/p CABG, s/p AVR- pig valve - Obtained from outside hospital medical records.       Discharge Condition:  Stable   Follow UP  Follow-up Information    Follow up with [facility] On 09/18/2015.   Why:  Coumadin check at 2:30 pm. Please arrive 15 minutes early for paperwork.   Contact information:   [place] [phone]       Follow up with Ermalinda Barrios, PA-C On 10/01/2015.   Specialty:  Cardiology   Why:  See provider at 9:15 am.  Please arrive 15 minutes early for paperwork.   Contact information:   [address] [place] [address] [phone]       Follow up with [facility].   Why:  Holter monitor. The office will call to schedule. You will come in and get it put on.    Contact information:   [place] [phone]         Discharge Instructions  and  Discharge Medications     Discharge Instructions    Discharge instructions    Complete by:  As directed   Please keep your appointments with cardiology clinic, warfarin clinic as scheduled. -   Get  CBC, CMP, 2 view Chest X ray checked  by Primary MD next visit.    Activity: As tolerated with Full fall precautions use walker/cane & assistance as needed   Disposition Home    Diet: Heart Healthy  , with feeding assistance and aspiration precautions.  For Heart failure patients - Check your Weight same time everyday, if you gain over 2 pounds, or you develop in leg swelling, experience more shortness of breath or chest pain, call your Primary MD immediately. Follow Cardiac Low Salt Diet and 1.5 lit/day fluid restriction.   On your next visit with your primary care physician please Get Medicines reviewed and adjusted.   Please request your Prim.MD to go over all Hospital Tests and Procedure/Radiological results at the follow up, please get all Hospital records sent to your Prim MD by signing hospital release before you go home.   If you experience worsening of your admission symptoms, develop shortness of breath, life threatening emergency, suicidal or homicidal thoughts you must seek medical attention immediately by calling 911 or calling your MD immediately  if symptoms less severe.  You Must read complete instructions/literature along with all the possible adverse reactions/side effects for all the Medicines you take and that have been prescribed to you. Take any new Medicines after  you have completely understood and accpet all the possible adverse reactions/side effects.   Do not drive, operating heavy machinery, perform activities at heights, swimming or participation in water activities or provide baby sitting services if your were admitted for syncope or siezures until you have seen by Primary MD or a Neurologist and advised to do so again.  Do not drive when taking Pain medications.    Do not take more than prescribed Pain, Sleep and Anxiety Medications  Special Instructions: If you have smoked or chewed Tobacco  in the last 2 yrs please stop smoking, stop any regular Alcohol  and or any Recreational drug use.  Wear Seat belts while driving.   Please note  You were cared for by a hospitalist during your hospital stay. If you have any questions about your discharge medications or the care you received while you were in the hospital after you are discharged, you can call the unit and asked to speak with the hospitalist on call if the hospitalist that took care of you is not available. Once you are discharged, your primary care physician will handle any further medical issues. Please note that NO REFILLS for any discharge medications will be authorized once you are discharged, as it is imperative that you return to your primary care physician (or establish a relationship with a primary care physician if you do not have one) for your aftercare needs so that they can reassess your need for medications and monitor your lab values.     Increase activity slowly    Complete by:  As directed             Medication List    STOP taking these medications        carvedilol 25 MG tablet  Commonly known as:  COREG     ergocalciferol 50000 UNITS capsule  Commonly known as:  VITAMIN D2      TAKE these medications        furosemide 80 MG tablet  Commonly known as:  LASIX  Take 1 tablet (80 mg total) by mouth daily.     insulin glargine 100 UNIT/ML injection  Commonly  known as:  LANTUS  Inject  0.2 mLs (20 Units total) into the skin at bedtime.     lisinopril 10 MG tablet  Commonly known as:  PRINIVIL,ZESTRIL  Take 10 mg by mouth daily.     metoprolol 100 MG tablet  Commonly known as:  LOPRESSOR  Take 1 tablet (100 mg total) by mouth 2 (two) times daily.     potassium chloride SA 20 MEQ tablet  Commonly known as:  K-DUR,KLOR-CON  Take 20 mEq by mouth 2 (two) times daily.     warfarin 5 MG tablet  Commonly known as:  COUMADIN  Take 1 tablet (5 mg total) by mouth daily.  Start taking on:  09/15/2015          Diet and Activity recommendation: See Discharge Instructions above   Consults obtained -  Cardiology   Major procedures and Radiology Reports - PLEASE review detailed and final reports for all details, in brief -      US Renal  09/11/2015  CLINICAL DATA:  54 year old [name] with stage 4 chronic kidney disease. EXAM: RENAL / URINARY TRACT ULTRASOUND COMPLETE COMPARISON:  None. FINDINGS: Right Kidney: Length: Approximately 10.2 cm. No hydronephrosis. Echogenic parenchyma. No focal parenchymal abnormality. No visible shadowing calculi. Left Kidney: Length: Approximately 10.6 cm. No hydronephrosis. Echogenic parenchyma. Approximate 1.8 x 1.1 x 1.6 cm cyst arising from the upper pole. No solid parenchymal mass. Tram track calcification with shadowing, consistent with intrarenal vessels. No definite shadowing calculi. Bladder: Normal for degree of bladder distention. IMPRESSION: 1. No evidence of hydronephrosis involving either kidney. 2. Echogenic renal parenchyma as expected with the given history of stage 4 chronic kidney disease. 3. Benign approximate 2 cm cyst arising from the upper pole of the left kidney. Electronically Signed   By: Evangeline Dakin M.D.   On: 09/11/2015 10:55   Nm Pulmonary Perf And Vent  09/11/2015  CLINICAL DATA:  Shortness of Breath EXAM: NUCLEAR MEDICINE VENTILATION - PERFUSION LUNG SCAN Views: Anterior,  posterior, left lateral, right lateral, RPO, LPO, RAO, LAO -ventilation and perfusion Radionuclide: Technetium 12m DTPA -ventilation; Technetium 60m macroaggregated albumin-perfusion Dose:  0000000 millicurie-ventilation; [id number] millicurie-perfusion Route of administration: Inhalation -ventilation ; intravenous -perfusion COMPARISON:  Chest radiograph September 10, 2015 FINDINGS: Ventilation: The distribution of radiotracer uptake on the ventilation study is homogeneous and symmetric bilaterally. Heart is noted to be enlarged. Perfusion: The distribution of radiotracer uptake on the perfusion study is homogeneous and symmetric bilaterally. The heart is noted to be enlarged. IMPRESSION: No appreciable ventilation or perfusion defects. This study is felt to constitute an overall very low probability of pulmonary embolus. Cardiomegaly noted. Electronically Signed   By: Lowella Grip III M.D.   On: 09/11/2015 12:36   Dg Chest Portable 1 View  09/10/2015  CLINICAL DATA:  Shortness of Breath EXAM: PORTABLE CHEST 1 VIEW COMPARISON:  None. FINDINGS: Cardiomegaly. Status post median sternotomy. No acute infiltrate or pulmonary edema. Mild basilar atelectasis. IMPRESSION: No active disease.  Cardiomegaly.  Status post median sternotomy. Electronically Signed   By: Lahoma Crocker M.D.   On: 09/10/2015 17:37    Micro Results     No results found for this or any previous visit (from the past 240 hour(s)).     Today   Subjective:   Michael [name] today has no headache,no chest abdominal pain,no new weakness tingling or numbness, feels much better wants to go home today.   Objective:   Blood pressure 110/76, pulse 104, temperature 98.6 F (37 C), temperature source Oral, resp. rate 18,  height 5\' 3"  (1.6 m), weight 95.119 kg (209 lb 11.2 oz), SpO2 98 %.   Intake/Output Summary (Last 24 hours) at 09/14/15 1340 Last data filed at 09/14/15 0801  Gross per 24 hour  Intake    953 ml  Output      0 ml  Net     953 ml    Exam Awake Alert, Oriented x 3, No new F.N deficits, Normal affect [place].AT,PERRAL Supple Neck,No JVD, No cervical lymphadenopathy appriciated.  Symmetrical Chest wall movement, Good air movement bilaterally, CTA bilaterally irregular,No Gallops , No Parasternal Heave +ve B.Sounds, Abd Soft, Non tender, No organomegaly appriciated, No rebound -guarding or rigidity. No Cyanosis, Clubbing or edema, No new Rash or bruise  Data Review   CBC w Diff: Lab Results  Component Value Date   WBC 5.7 09/14/2015   HGB 9.9* 09/14/2015   HCT 30.8* 09/14/2015   PLT 219 09/14/2015    CMP: Lab Results  Component Value Date   NA 141 09/14/2015   K 4.2 09/14/2015   CL 101 09/14/2015   CO2 26 09/14/2015   BUN 40* 09/14/2015   CREATININE 2.08* 09/14/2015  .   Total Time in preparing paper work, data evaluation and todays exam - 35 minutes  ELGERGAWY, DAWOOD M.D on 09/14/2015 at 1:40 PM  [facility]   Office  [phone]

## 2015-09-14 NOTE — Progress Notes (Signed)
Utilization review completed.  

## 2015-09-14 NOTE — Consult Note (Signed)
ANTICOAGULATION CONSULT NOTE - Follow up  Pharmacy Consult for Heparin and Coumadin Indication: atrial fibrillation  No Known Allergies  Patient Measurements: Height: 5\' 3"  (160 cm) Weight: 209 lb 11.2 oz (95.119 kg) IBW/kg (Calculated) : 56.9  Heparin dosing weight = 78.5 kg   Vital Signs: Temp: 98.6 F (37 C) (10/28 0450) Temp Source: Oral (10/28 0450) BP: 110/76 mmHg (10/28 0634) Pulse Rate: 104 (10/28 0634)  Labs:  Recent Labs  09/12/15 0245 09/12/15 1448 09/13/15 0252 09/13/15 1435 09/14/15 0350  HGB 10.1*  --  10.2*  --  9.9*  HCT 31.1*  --  31.2*  --  30.8*  PLT 194  --  211  --  219  LABPROT  --  15.2 16.3*  --  17.7*  INR  --  1.18 1.30  --  1.45  HEPARINUNFRC 0.33  --  0.29* 0.45 0.40  CREATININE 2.50*  --  2.28*  --  2.08*    Assessment: 54yom presented to the ED on 09/10/15 with dyspnea x 2 days. He was found to be in afib ? new onset. CHADSVASC is 4. Continuing on IV heparin bridge/warfarin (no pta anticoag). VQ scan was low prob of PE, LLE venous Doppler negative for DVT.   HL therapeutic x2 (0.4) on 1700 units/h - following daily levels now, INR remains subtherapeutic 1.3>>1.45. Hgb low/stable at 9.9, pltc wnl. No bleeding noted   Goal of Therapy:  Heparin level 0.3-0.7 units/ml Monitor platelets by anticoagulation protocol: Yes   Plan:  Heparin drip at 1700 units/h - d/c when INR therapeutic x2 Coumadin 7.5 mg x1 tonight Daily HL/CBC/INR Mon s/sx bleeding  Elicia Lamp, PharmD Clinical Pharmacist Pager [phone] 09/14/2015 9:03 AM

## 2015-09-15 ENCOUNTER — Emergency Department (HOSPITAL_COMMUNITY)
Admission: EM | Admit: 2015-09-15 | Discharge: 2015-09-15 | Disposition: A | Discharge status: Home / Self Care | Payer: Medicare Other | Attending: Emergency Medicine | Admitting: Emergency Medicine

## 2015-09-15 ENCOUNTER — Emergency Department (HOSPITAL_COMMUNITY): Payer: Medicare Other

## 2015-09-15 ENCOUNTER — Encounter (HOSPITAL_COMMUNITY): Payer: Self-pay | Admitting: Emergency Medicine

## 2015-09-15 DIAGNOSIS — E119 Type 2 diabetes mellitus without complications: Secondary | ICD-10-CM | POA: Insufficient documentation

## 2015-09-15 DIAGNOSIS — Z720 Tobacco use: Secondary | ICD-10-CM | POA: Diagnosis not present

## 2015-09-15 DIAGNOSIS — N184 Chronic kidney disease, stage 4 (severe): Secondary | ICD-10-CM | POA: Diagnosis not present

## 2015-09-15 DIAGNOSIS — I509 Heart failure, unspecified: Secondary | ICD-10-CM | POA: Diagnosis not present

## 2015-09-15 DIAGNOSIS — Z79899 Other long term (current) drug therapy: Secondary | ICD-10-CM | POA: Insufficient documentation

## 2015-09-15 DIAGNOSIS — E669 Obesity, unspecified: Secondary | ICD-10-CM | POA: Diagnosis not present

## 2015-09-15 DIAGNOSIS — Z862 Personal history of diseases of the blood and blood-forming organs and certain disorders involving the immune mechanism: Secondary | ICD-10-CM | POA: Insufficient documentation

## 2015-09-15 DIAGNOSIS — Z794 Long term (current) use of insulin: Secondary | ICD-10-CM | POA: Diagnosis not present

## 2015-09-15 DIAGNOSIS — I4891 Unspecified atrial fibrillation: Secondary | ICD-10-CM | POA: Insufficient documentation

## 2015-09-15 DIAGNOSIS — M79672 Pain in left foot: Secondary | ICD-10-CM | POA: Diagnosis not present

## 2015-09-15 DIAGNOSIS — I2510 Atherosclerotic heart disease of native coronary artery without angina pectoris: Secondary | ICD-10-CM | POA: Insufficient documentation

## 2015-09-15 DIAGNOSIS — R0600 Dyspnea, unspecified: Secondary | ICD-10-CM | POA: Diagnosis not present

## 2015-09-15 DIAGNOSIS — I129 Hypertensive chronic kidney disease with stage 1 through stage 4 chronic kidney disease, or unspecified chronic kidney disease: Secondary | ICD-10-CM | POA: Insufficient documentation

## 2015-09-15 DIAGNOSIS — Z8669 Personal history of other diseases of the nervous system and sense organs: Secondary | ICD-10-CM | POA: Diagnosis not present

## 2015-09-15 DIAGNOSIS — Z9889 Other specified postprocedural states: Secondary | ICD-10-CM | POA: Insufficient documentation

## 2015-09-15 DIAGNOSIS — R0602 Shortness of breath: Secondary | ICD-10-CM | POA: Diagnosis not present

## 2015-09-15 DIAGNOSIS — R079 Chest pain, unspecified: Secondary | ICD-10-CM | POA: Insufficient documentation

## 2015-09-15 DIAGNOSIS — Z8679 Personal history of other diseases of the circulatory system: Secondary | ICD-10-CM

## 2015-09-15 LAB — CBC WITH DIFFERENTIAL/PLATELET
BASOS ABS: 0 10*3/uL (ref 0.0–0.1)
BASOS PCT: 1 %
EOS ABS: 0.2 10*3/uL (ref 0.0–0.7)
Eosinophils Relative: 3 %
HEMATOCRIT: 30.8 % — AB (ref 39.0–52.0)
HEMOGLOBIN: 10 g/dL — AB (ref 13.0–17.0)
Lymphocytes Relative: 21 %
Lymphs Abs: 1.1 10*3/uL (ref 0.7–4.0)
MCH: 24.4 pg — ABNORMAL LOW (ref 26.0–34.0)
MCHC: 32.5 g/dL (ref 30.0–36.0)
MCV: 75.1 fL — ABNORMAL LOW (ref 78.0–100.0)
Monocytes Absolute: 0.8 10*3/uL (ref 0.1–1.0)
Monocytes Relative: 16 %
NEUTROS ABS: 3.1 10*3/uL (ref 1.7–7.7)
NEUTROS PCT: 59 %
Platelets: 189 10*3/uL (ref 150–400)
RBC: 4.1 MIL/uL — ABNORMAL LOW (ref 4.22–5.81)
RDW: 16.7 % — AB (ref 11.5–15.5)
WBC: 5.2 10*3/uL (ref 4.0–10.5)

## 2015-09-15 LAB — COMPREHENSIVE METABOLIC PANEL
ALK PHOS: 77 U/L (ref 38–126)
ALT: 12 U/L — ABNORMAL LOW (ref 17–63)
ANION GAP: 13 (ref 5–15)
AST: 15 U/L (ref 15–41)
Albumin: 3.1 g/dL — ABNORMAL LOW (ref 3.5–5.0)
BILIRUBIN TOTAL: 0.7 mg/dL (ref 0.3–1.2)
BUN: 42 mg/dL — ABNORMAL HIGH (ref 6–20)
CALCIUM: 9.5 mg/dL (ref 8.9–10.3)
CO2: 24 mmol/L (ref 22–32)
CREATININE: 2.72 mg/dL — AB (ref 0.61–1.24)
Chloride: 103 mmol/L (ref 101–111)
GFR, EST AFRICAN AMERICAN: 29 mL/min — AB (ref >60)
GFR, EST NON AFRICAN AMERICAN: 25 mL/min — AB (ref >60)
Glucose, Bld: 119 mg/dL — ABNORMAL HIGH (ref 65–99)
Potassium: 4.5 mmol/L (ref 3.5–5.1)
Sodium: 140 mmol/L (ref 135–145)
TOTAL PROTEIN: 6.8 g/dL (ref 6.5–8.1)

## 2015-09-15 LAB — BRAIN NATRIURETIC PEPTIDE: B NATRIURETIC PEPTIDE 5: 305.5 pg/mL — AB (ref 0.0–100.0)

## 2015-09-15 LAB — I-STAT TROPONIN, ED: TROPONIN I, POC: 0.03 ng/mL (ref 0.00–0.08)

## 2015-09-15 MED ORDER — FUROSEMIDE 10 MG/ML IJ SOLN
40.0000 mg | Freq: Once | INTRAMUSCULAR | Status: AC
  Administered 2015-09-15: 40 mg via INTRAVENOUS
  Filled 2015-09-15: qty 4

## 2015-09-15 NOTE — ED Notes (Signed)
Patient reports being short of breath, and was recently discharged. Patient restless on arrival to room.

## 2015-09-15 NOTE — ED Provider Notes (Signed)
CSN: [id number]     Arrival date & time 09/15/15  [id number] History  By signing my name below, I, Arianna Nassar, attest that this documentation has been prepared under the direction and in the presence of Veryl Speak, MD. Electronically Signed: Julien Nordmann, ED Scribe. 09/15/2015. 3:59 AM.     Chief Complaint  Patient presents with  . Shortness of Breath      The history is provided by the patient. No language interpreter was used.   HPI Comments: Michael [name] is a 54 y.o. male who has a hx of multiple heart problems presents to the Emergency Department complaining of constant, gradual worsening shortness of breath onset this evening. He endorses mild chest pain and left foot pain. Pt was seen this week for similar symptoms and notes he has not gotten any better. Pt denies cough and fever.  Past Medical History  Diagnosis Date  . Coronary artery disease   . Diabetes mellitus without complication ([place])   . Hypertension   . CHF (congestive heart failure) ([place])   . 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 H/O tricuspid valve repair 2012  . Paroxysmal atrial fibrillation (HCC)   . Diabetes mellitus type 2 in obese ([place])   . Obstructive sleep apnea   . Chronic kidney disease (CKD), stage IV (severe) ([place])   . Anemia of chronic disease   . Pulmonary HTN ([place])    Past Surgical History  Procedure Laterality Date  . Cardiac surgery     No family history on file. Social History  Substance Use Topics  . Smoking status: Current Some Day Smoker  . Smokeless tobacco: Not on file  . Alcohol Use: No    Review of Systems  Constitutional: Negative for fever.  Respiratory: Positive for shortness of breath. Negative for cough.   Cardiovascular: Positive for chest pain.  All other systems reviewed and are negative.     Allergies  Review of patient's allergies indicates no known allergies.  Home Medications   Prior to Admission medications   Medication Sig  Start Date End Date Taking? Authorizing Provider  furosemide (LASIX) 80 MG tablet Take 1 tablet (80 mg total) by mouth daily. 09/14/15   Silver Huguenin Elgergawy, MD  insulin glargine (LANTUS) 100 UNIT/ML injection Inject 0.2 mLs (20 Units total) into the skin at bedtime. 09/14/15   Silver Huguenin Elgergawy, MD  lisinopril (PRINIVIL,ZESTRIL) 10 MG tablet Take 10 mg by mouth daily.    Historical Provider, MD  metoprolol (LOPRESSOR) 100 MG tablet Take 1 tablet (100 mg total) by mouth 2 (two) times daily. 09/14/15   Silver Huguenin Elgergawy, MD  potassium chloride SA (K-DUR,KLOR-CON) 20 MEQ tablet Take 20 mEq by mouth 2 (two) times daily.    Historical Provider, MD  warfarin (COUMADIN) 5 MG tablet Take 1 tablet (5 mg total) by mouth daily. 09/15/15   Silver Huguenin Elgergawy, MD   Triage vitals: BP 119/60 mmHg  Pulse 105  Temp(Src) 97.6 F (36.4 C) (Oral)  Resp 22  Ht 5\' 6"  (1.676 m)  Wt 200 lb (90.719 kg)  BMI 32.30 kg/m2  SpO2 100% Physical Exam  Constitutional: He is oriented to person, place, and time. He appears well-developed and well-nourished.  HENT:  Head: Normocephalic and atraumatic.  Eyes: EOM are normal.  Neck: Normal range of motion.  Cardiovascular: Intact distal pulses.   Heart is irregularly irregular and rapid  Pulmonary/Chest: Effort normal and breath sounds normal. No respiratory distress. He has no wheezes.  He has no rales.  Abdominal: Soft. He exhibits no distension. There is no tenderness.  Musculoskeletal: Normal range of motion. He exhibits edema.  Trace edema of the BLE.  Neurological: He is alert and oriented to person, place, and time.  Skin: Skin is warm and dry.  Psychiatric: He has a normal mood and affect. Judgment normal.  Nursing note and vitals reviewed.   ED Course  Procedures  DIAGNOSTIC STUDIES: Oxygen Saturation is 100% on RA, normal by my interpretation.  COORDINATION OF CARE:  3:50 AM Discussed treatment plan which includes repeat labs to check levels with pt at  bedside and pt agreed to plan.  Labs Review Labs Reviewed - No data to display  Imaging Review No results found. I have personally reviewed and evaluated these images and lab results as part of my medical decision-making.   EKG Interpretation   Date/Time:  Saturday September 15 2015 03:45:02 EDT Ventricular Rate:  100 PR Interval:    QRS Duration: 105 QT Interval:  370 QTC Calculation: 477 R Axis:   29 Text Interpretation:  Atrial fibrillation Ventricular premature complex  Abnormal R-wave progression, early transition Nonspecific T abnrm,  anterolateral leads Borderline prolonged QT interval Confirmed by Equan Cogbill   MD, Avleen Bordwell ([id number]) on 09/15/2015 5:13:29 AM      MDM   Final diagnoses:  None   Patient is a 54 year old [name] male who presents for evaluation of shortness of breath. He was recently admitted for new onset atrial fibrillation. He was discharged with Lasix after an extensive workup. He returns today with complaints of ongoing shortness of breath. He remains in atrial fibrillation and chest x-ray is suggestive of congestive heart failure. His BNP has actually improved from discharge and his laboratory studies are otherwise at her baseline. His troponin is negative and EKG is unchanged.  His oxygen saturations are 98% on room air and he is in no respiratory distress. His breath sounds are clear and he does not appear as though he is in significant CHF. I discussed the disposition with the patient and he is requesting to be discharged. He does not want to be readmitted. As he is not hypoxic and underwent extensive workup during his previous hospitalization, I feel as though this is appropriate. He had a very low probability VQ scan, echocardiogram revealing an EF of 35% while he was in the hospital.  I personally performed the services described in this documentation, which was scribed in my presence. The recorded information has been reviewed and is accurate.       Veryl Speak, MD 09/15/15 [phone]

## 2015-09-15 NOTE — ED Notes (Signed)
Dr. Delo at the bedside 

## 2015-09-15 NOTE — ED Notes (Signed)
Patient currently sitting in chair for comfort, placed back on telemetry monitor, blood pressure cuff and pulse ox applied.

## 2015-09-15 NOTE — Discharge Instructions (Signed)
Continue taking your Lasix as previously prescribed.  Follow-up with your primary Dr. next week, and return to the ER if symptoms significantly worsen or change.   Atrial Fibrillation Atrial fibrillation is a type of heartbeat that is irregular or fast (rapid). If you have this condition, your heart keeps quivering in a weird (chaotic) way. This condition can make it so your heart cannot pump blood normally. Having this condition gives a person more risk for stroke, heart failure, and other heart problems. There are different types of atrial fibrillation. Talk with your doctor to learn about the type that you have. HOME CARE  Take over-the-counter and prescription medicines only as told by your doctor.  If your doctor prescribed a blood-thinning medicine, take it exactly as told. Taking too much of it can cause bleeding. If you do not take enough of it, you will not have the protection that you need against stroke and other problems.  Do not use any tobacco products. These include cigarettes, chewing tobacco, and e-cigarettes. If you need help quitting, ask your doctor.  If you have apnea (obstructive sleep apnea), manage it as told by your doctor.  Do not drink alcohol.  Do not drink beverages that have caffeine. These include coffee, soda, and tea.  Maintain a healthy weight. Do not use diet pills unless your doctor says they are safe for you. Diet pills may make heart problems worse.  Follow diet instructions as told by your doctor.  Exercise regularly as told by your doctor.  Keep all follow-up visits as told by your doctor. This is important. GET HELP IF:  You notice a change in the speed, rhythm, or strength of your heartbeat.  You are taking a blood-thinning medicine and you notice more bruising.  You get tired more easily when you move or exercise. GET HELP RIGHT AWAY IF:  You have pain in your chest or your belly (abdomen).  You have sweating or weakness.  You feel  sick to your stomach (nauseous).  You notice blood in your throw up (vomit), poop (stool), or pee (urine).  You are short of breath.  You suddenly have swollen feet and ankles.  You feel dizzy.  Your suddenly get weak or numb in your face, arms, or legs, especially if it happens on one side of your body.  You have trouble talking, trouble understanding, or both.  Your face or your eyelid droops on one side. These symptoms may be an emergency. Do not wait to see if the symptoms will go away. Get medical help right away. Call your local emergency services (911 in the [place] Do not drive yourself to the hospital.   This information is not intended to replace advice given to you by your health care provider. Make sure you discuss any questions you have with your health care provider.   Document Released: 08/12/2008 Document Revised: 07/25/2015 Document Reviewed: 02/28/2015 Elsevier Interactive Patient Education 2016 [place] of Breath Shortness of breath means you have trouble breathing. It could also mean that you have a medical problem. You should get immediate medical care for shortness of breath. CAUSES   Not enough oxygen in the air such as with high altitudes or a smoke-filled room.  Certain lung diseases, infections, or problems.  Heart disease or conditions, such as angina or heart failure.  Low red blood cells (anemia).  Poor physical fitness, which can cause shortness of breath when you exercise.  Chest or back injuries or stiffness.  Being overweight.  Smoking.  Anxiety, which can make you feel like you are not getting enough air. DIAGNOSIS  Serious medical problems can often be found during your physical exam. Tests may also be done to determine why you are having shortness of breath. Tests may include:  Chest X-rays.  Lung function tests.  Blood tests.  An electrocardiogram (ECG).  An ambulatory electrocardiogram. An ambulatory ECG  records your heartbeat patterns over a 24-hour period.  Exercise testing.  A transthoracic echocardiogram (TTE). During echocardiography, sound waves are used to evaluate how blood flows through your heart.  A transesophageal echocardiogram (TEE).  Imaging scans. Your health care provider may not be able to find a cause for your shortness of breath after your exam. In this case, it is important to have a follow-up exam with your health care provider as directed.  TREATMENT  Treatment for shortness of breath depends on the cause of your symptoms and can vary greatly. HOME CARE INSTRUCTIONS   Do not smoke. Smoking is a common cause of shortness of breath. If you smoke, ask for help to quit.  Avoid being around chemicals or things that may bother your breathing, such as paint fumes and dust.  Rest as needed. Slowly resume your usual activities.  If medicines were prescribed, take them as directed for the full length of time directed. This includes oxygen and any inhaled medicines.  Keep all follow-up appointments as directed by your health care provider. SEEK MEDICAL CARE IF:   Your condition does not improve in the time expected.  You have a hard time doing your normal activities even with rest.  You have any new symptoms. SEEK IMMEDIATE MEDICAL CARE IF:   Your shortness of breath gets worse.  You feel light-headed, faint, or develop a cough not controlled with medicines.  You start coughing up blood.  You have pain with breathing.  You have chest pain or pain in your arms, shoulders, or abdomen.  You have a fever.  You are unable to walk up stairs or exercise the way you normally do. MAKE SURE YOU:  Understand these instructions.  Will watch your condition.  Will get help right away if you are not doing well or get worse.   This information is not intended to replace advice given to you by your health care provider. Make sure you discuss any questions you have with  your health care provider.   Document Released: 07/29/2001 Document Revised: 11/08/2013 Document Reviewed: 01/19/2012 Elsevier Interactive Patient Education [employer].

## 2015-09-15 NOTE — ED Notes (Signed)
Urinal provided to measure output post lasix.

## 2015-10-01 ENCOUNTER — Encounter: Payer: Medicare Other | Admitting: Physician Assistant

## 2015-10-01 DIAGNOSIS — R0989 Other specified symptoms and signs involving the circulatory and respiratory systems: Secondary | ICD-10-CM

## 2015-10-04 ENCOUNTER — Encounter: Payer: Self-pay | Admitting: Physician Assistant

## 2015-10-09 ENCOUNTER — Encounter: Payer: Self-pay | Admitting: Cardiology

## 2015-10-09 ENCOUNTER — Inpatient Hospital Stay (HOSPITAL_COMMUNITY)
Admission: AD | Admit: 2015-10-09 | Discharge: 2015-10-17 | DRG: 291 | Disposition: A | Discharge status: Home / Self Care | Payer: Medicare Other | Source: Ambulatory Visit | Attending: Internal Medicine | Admitting: Internal Medicine

## 2015-10-09 ENCOUNTER — Ambulatory Visit (INDEPENDENT_AMBULATORY_CARE_PROVIDER_SITE_OTHER): Payer: Medicare Other | Admitting: Cardiology

## 2015-10-09 ENCOUNTER — Other Ambulatory Visit: Payer: Self-pay | Admitting: Nurse Practitioner

## 2015-10-09 VITALS — BP 118/72 | HR 106 | Ht 63.0 in | Wt 228.0 lb

## 2015-10-09 DIAGNOSIS — L03116 Cellulitis of left lower limb: Secondary | ICD-10-CM | POA: Diagnosis present

## 2015-10-09 DIAGNOSIS — Z720 Tobacco use: Secondary | ICD-10-CM

## 2015-10-09 DIAGNOSIS — Z953 Presence of xenogenic heart valve: Secondary | ICD-10-CM

## 2015-10-09 DIAGNOSIS — D638 Anemia in other chronic diseases classified elsewhere: Secondary | ICD-10-CM | POA: Diagnosis present

## 2015-10-09 DIAGNOSIS — I10 Essential (primary) hypertension: Secondary | ICD-10-CM | POA: Diagnosis not present

## 2015-10-09 DIAGNOSIS — E1129 Type 2 diabetes mellitus with other diabetic kidney complication: Secondary | ICD-10-CM

## 2015-10-09 DIAGNOSIS — I5042 Chronic combined systolic (congestive) and diastolic (congestive) heart failure: Secondary | ICD-10-CM | POA: Diagnosis not present

## 2015-10-09 DIAGNOSIS — I5023 Acute on chronic systolic (congestive) heart failure: Secondary | ICD-10-CM

## 2015-10-09 DIAGNOSIS — R609 Edema, unspecified: Secondary | ICD-10-CM

## 2015-10-09 DIAGNOSIS — I509 Heart failure, unspecified: Secondary | ICD-10-CM | POA: Diagnosis not present

## 2015-10-09 DIAGNOSIS — Z794 Long term (current) use of insulin: Secondary | ICD-10-CM | POA: Diagnosis not present

## 2015-10-09 DIAGNOSIS — I5043 Acute on chronic combined systolic (congestive) and diastolic (congestive) heart failure: Secondary | ICD-10-CM | POA: Diagnosis present

## 2015-10-09 DIAGNOSIS — E1165 Type 2 diabetes mellitus with hyperglycemia: Secondary | ICD-10-CM | POA: Diagnosis present

## 2015-10-09 DIAGNOSIS — Z7901 Long term (current) use of anticoagulants: Secondary | ICD-10-CM

## 2015-10-09 DIAGNOSIS — F17210 Nicotine dependence, cigarettes, uncomplicated: Secondary | ICD-10-CM | POA: Diagnosis present

## 2015-10-09 DIAGNOSIS — E669 Obesity, unspecified: Secondary | ICD-10-CM | POA: Diagnosis present

## 2015-10-09 DIAGNOSIS — I4891 Unspecified atrial fibrillation: Secondary | ICD-10-CM | POA: Diagnosis present

## 2015-10-09 DIAGNOSIS — N184 Chronic kidney disease, stage 4 (severe): Secondary | ICD-10-CM | POA: Diagnosis present

## 2015-10-09 DIAGNOSIS — E1121 Type 2 diabetes mellitus with diabetic nephropathy: Secondary | ICD-10-CM | POA: Diagnosis not present

## 2015-10-09 DIAGNOSIS — R0602 Shortness of breath: Secondary | ICD-10-CM | POA: Diagnosis not present

## 2015-10-09 DIAGNOSIS — I130 Hypertensive heart and chronic kidney disease with heart failure and stage 1 through stage 4 chronic kidney disease, or unspecified chronic kidney disease: Secondary | ICD-10-CM | POA: Diagnosis present

## 2015-10-09 DIAGNOSIS — E1122 Type 2 diabetes mellitus with diabetic chronic kidney disease: Secondary | ICD-10-CM | POA: Diagnosis present

## 2015-10-09 DIAGNOSIS — Z9119 Patient's noncompliance with other medical treatment and regimen: Secondary | ICD-10-CM | POA: Diagnosis not present

## 2015-10-09 DIAGNOSIS — I480 Paroxysmal atrial fibrillation: Secondary | ICD-10-CM | POA: Diagnosis present

## 2015-10-09 DIAGNOSIS — I5022 Chronic systolic (congestive) heart failure: Secondary | ICD-10-CM | POA: Diagnosis present

## 2015-10-09 DIAGNOSIS — Z5181 Encounter for therapeutic drug level monitoring: Secondary | ICD-10-CM

## 2015-10-09 DIAGNOSIS — I739 Peripheral vascular disease, unspecified: Secondary | ICD-10-CM

## 2015-10-09 DIAGNOSIS — G4733 Obstructive sleep apnea (adult) (pediatric): Secondary | ICD-10-CM | POA: Diagnosis present

## 2015-10-09 DIAGNOSIS — D649 Anemia, unspecified: Secondary | ICD-10-CM | POA: Diagnosis present

## 2015-10-09 DIAGNOSIS — Z6841 Body Mass Index (BMI) 40.0 and over, adult: Secondary | ICD-10-CM

## 2015-10-09 DIAGNOSIS — I272 Other secondary pulmonary hypertension: Secondary | ICD-10-CM | POA: Diagnosis present

## 2015-10-09 DIAGNOSIS — I429 Cardiomyopathy, unspecified: Secondary | ICD-10-CM | POA: Diagnosis present

## 2015-10-09 DIAGNOSIS — I059 Rheumatic mitral valve disease, unspecified: Secondary | ICD-10-CM | POA: Diagnosis present

## 2015-10-09 DIAGNOSIS — I5020 Unspecified systolic (congestive) heart failure: Secondary | ICD-10-CM

## 2015-10-09 DIAGNOSIS — I079 Rheumatic tricuspid valve disease, unspecified: Secondary | ICD-10-CM | POA: Diagnosis present

## 2015-10-09 DIAGNOSIS — D509 Iron deficiency anemia, unspecified: Secondary | ICD-10-CM | POA: Diagnosis not present

## 2015-10-09 LAB — COMPREHENSIVE METABOLIC PANEL
ALT: 10 U/L — ABNORMAL LOW (ref 17–63)
AST: 24 U/L (ref 15–41)
Albumin: 2.9 g/dL — ABNORMAL LOW (ref 3.5–5.0)
Alkaline Phosphatase: 102 U/L (ref 38–126)
Anion gap: 9 (ref 5–15)
BUN: 87 mg/dL — AB (ref 6–20)
CHLORIDE: 108 mmol/L (ref 101–111)
CO2: 23 mmol/L (ref 22–32)
Calcium: 8.8 mg/dL — ABNORMAL LOW (ref 8.9–10.3)
Creatinine, Ser: 4.01 mg/dL — ABNORMAL HIGH (ref 0.61–1.24)
GFR calc Af Amer: 18 mL/min — ABNORMAL LOW (ref >60)
GFR, EST NON AFRICAN AMERICAN: 15 mL/min — AB (ref >60)
Glucose, Bld: 118 mg/dL — ABNORMAL HIGH (ref 65–99)
POTASSIUM: 5.1 mmol/L (ref 3.5–5.1)
SODIUM: 140 mmol/L (ref 135–145)
Total Bilirubin: 1 mg/dL (ref 0.3–1.2)
Total Protein: 6.6 g/dL (ref 6.5–8.1)

## 2015-10-09 LAB — CBC WITH DIFFERENTIAL/PLATELET
BASOS ABS: 0 10*3/uL (ref 0.0–0.1)
BASOS PCT: 1 %
EOS ABS: 0.3 10*3/uL (ref 0.0–0.7)
EOS PCT: 4 %
HCT: 33.3 % — ABNORMAL LOW (ref 39.0–52.0)
Hemoglobin: 10.3 g/dL — ABNORMAL LOW (ref 13.0–17.0)
LYMPHS ABS: 1.2 10*3/uL (ref 0.7–4.0)
LYMPHS PCT: 16 %
MCH: 24.7 pg — ABNORMAL LOW (ref 26.0–34.0)
MCHC: 30.9 g/dL (ref 30.0–36.0)
MCV: 79.9 fL (ref 78.0–100.0)
MONO ABS: 0.9 10*3/uL (ref 0.1–1.0)
Monocytes Relative: 12 %
NEUTROS ABS: 5.2 10*3/uL (ref 1.7–7.7)
NEUTROS PCT: 67 %
Platelets: 238 10*3/uL (ref 150–400)
RBC: 4.17 MIL/uL — ABNORMAL LOW (ref 4.22–5.81)
RDW: 18.2 % — AB (ref 11.5–15.5)
WBC: 7.7 10*3/uL (ref 4.0–10.5)

## 2015-10-09 LAB — GLUCOSE, CAPILLARY
Glucose-Capillary: 97 mg/dL (ref 65–99)
Glucose-Capillary: 172 mg/dL — ABNORMAL HIGH (ref 65–99)

## 2015-10-09 LAB — TSH: TSH: 3.289 u[IU]/mL (ref 0.350–4.500)

## 2015-10-09 LAB — APTT: aPTT: 40 seconds — ABNORMAL HIGH (ref 24–37)

## 2015-10-09 LAB — TROPONIN I
Troponin I: <0.03 ng/mL (ref <0.031)
Troponin I: <0.03 ng/mL (ref <0.031)

## 2015-10-09 LAB — PROTIME-INR
INR: 1.91 — AB (ref 0.00–1.49)
PROTHROMBIN TIME: 21.8 s — AB (ref 11.6–15.2)

## 2015-10-09 LAB — BRAIN NATRIURETIC PEPTIDE: B Natriuretic Peptide: 666.2 pg/mL — ABNORMAL HIGH (ref 0.0–100.0)

## 2015-10-09 MED ORDER — WARFARIN SODIUM 3 MG PO TABS
6.0000 mg | ORAL_TABLET | Freq: Once | ORAL | Status: AC
  Administered 2015-10-09: 6 mg via ORAL
  Filled 2015-10-09: qty 2

## 2015-10-09 MED ORDER — INSULIN ASPART 100 UNIT/ML ~~LOC~~ SOLN: 0.0000 [IU] | Freq: Three times a day (TID) | SUBCUTANEOUS | Status: DC

## 2015-10-09 MED ORDER — WARFARIN - PHARMACIST DOSING INPATIENT
Freq: Every day | Status: DC
  Administered 2015-10-16: 19:00:00

## 2015-10-09 MED ORDER — SODIUM CHLORIDE 0.9 % IJ SOLN: 3.0000 mL | INTRAMUSCULAR | Status: DC | PRN

## 2015-10-09 MED ORDER — ONDANSETRON HCL 4 MG/2ML IJ SOLN: 4.0000 mg | Freq: Four times a day (QID) | INTRAMUSCULAR | Status: DC | PRN

## 2015-10-09 MED ORDER — METOPROLOL TARTRATE 100 MG PO TABS
100.0000 mg | ORAL_TABLET | Freq: Two times a day (BID) | ORAL | Status: DC
  Administered 2015-10-09 – 2015-10-10 (×3): 100 mg via ORAL
  Filled 2015-10-09 (×3): qty 1

## 2015-10-09 MED ORDER — INSULIN ASPART 100 UNIT/ML ~~LOC~~ SOLN: 0.0000 [IU] | Freq: Every day | SUBCUTANEOUS | Status: DC

## 2015-10-09 MED ORDER — POTASSIUM CHLORIDE CRYS ER 20 MEQ PO TBCR
20.0000 meq | EXTENDED_RELEASE_TABLET | Freq: Three times a day (TID) | ORAL | Status: DC
Start: 2015-10-09 — End: 2015-10-14
  Administered 2015-10-09 – 2015-10-14 (×15): 20 meq via ORAL
  Filled 2015-10-09 (×15): qty 1

## 2015-10-09 MED ORDER — ACETAMINOPHEN 325 MG PO TABS
650.0000 mg | ORAL_TABLET | ORAL | Status: DC | PRN
  Administered 2015-10-11 – 2015-10-17 (×12): 650 mg via ORAL
  Filled 2015-10-09 (×12): qty 2

## 2015-10-09 MED ORDER — FUROSEMIDE 10 MG/ML IJ SOLN
80.0000 mg | Freq: Two times a day (BID) | INTRAMUSCULAR | Status: DC
  Administered 2015-10-09 – 2015-10-15 (×12): 80 mg via INTRAVENOUS
  Filled 2015-10-09 (×14): qty 8

## 2015-10-09 MED ORDER — SODIUM CHLORIDE 0.9 % IJ SOLN
3.0000 mL | Freq: Two times a day (BID) | INTRAMUSCULAR | Status: DC
  Administered 2015-10-09 – 2015-10-17 (×15): 3 mL via INTRAVENOUS

## 2015-10-09 MED ORDER — LISINOPRIL 10 MG PO TABS
10.0000 mg | ORAL_TABLET | Freq: Every day | ORAL | Status: DC
  Administered 2015-10-09: 10 mg via ORAL
  Filled 2015-10-09: qty 1

## 2015-10-09 MED ORDER — WARFARIN SODIUM 3 MG PO TABS: 6.0000 mg | ORAL_TABLET | Freq: Once | ORAL | Status: DC

## 2015-10-09 MED ORDER — SODIUM CHLORIDE 0.9 % IV SOLN: 250.0000 mL | INTRAVENOUS | Status: DC | PRN

## 2015-10-09 MED ORDER — INSULIN GLARGINE 100 UNIT/ML ~~LOC~~ SOLN
20.0000 [IU] | Freq: Every day | SUBCUTANEOUS | Status: DC
  Administered 2015-10-09: 20 [IU] via SUBCUTANEOUS
  Filled 2015-10-09 (×3): qty 0.2

## 2015-10-09 NOTE — Progress Notes (Signed)
ANTICOAGULATION CONSULT NOTE - Initial Consult  Pharmacy Consult for warfarin Indication: atrial fibrillation  No Known Allergies  Patient Measurements: Height: 5\' 3"  (160 cm) Weight: 224 lb 13.9 oz (102 kg) IBW/kg (Calculated) : 56.9  Vital Signs: Temp: 97.3 F (36.3 C) (11/22 1658) Temp Source: Oral (11/22 1658) BP: 125/94 mmHg (11/22 1658) Pulse Rate: 65 (11/22 1658)  Labs:  Recent Labs  10/09/15 1750  HGB 10.3*  HCT 33.3*  PLT 238  APTT 40*  LABPROT 21.8*  INR 1.91*  CREATININE 4.01*  TROPONINI <0.03    Estimated Creatinine Clearance: 22.1 mL/min (by C-G formula based on Cr of 4.01).  Assessment: 54 yo male presenting with SOB/follow-up for afib  PMH: meth use, MVR porcine in 2012, TV repair, AFib on warfarin with noncompliance, CKD, OSA, DM, Anemia, HTN, Pulm HTN, HLD  AC: on warfarin pta for MVR/afib. BL INR 1.91. Discuss with Dr. Burt Knack and we will only do warfarin and cancel heparin since INR close to therapeutic  PTA dose 5 mg daily with pt stating not taking  Renal: SCr 4.01  Heme: H&H 10.3/33.3, Plt 238  Goal of Therapy:  INR 2-3 Monitor platelets by anticoagulation protocol: Yes   Plan:  Warfarin 6 mg x 1 Daily INR, CBC q72h Monitor for s/sx of bleeding  Levester Fresh, PharmD, BCPS, [facility] Clinical Pharmacist Pager [phone] 10/09/2015 7:19 PM

## 2015-10-09 NOTE — Patient Instructions (Signed)
Medication Instructions:  None  Labwork: None  Testing/Procedures: None  Follow-Up: Will be determined in the hospital  Any Other Special Instructions Will Be Listed Below (If Applicable).  Your physician would like for you to proceed to [facility] admitting.     If you need a refill on your cardiac medications before your next appointment, please call your pharmacy.

## 2015-10-09 NOTE — Progress Notes (Signed)
Cr. Up to 4,  Stopping ACe.  MD to address in am.

## 2015-10-09 NOTE — H&P (Signed)
HISTORY AND PHYSICAL  Date:  10/09/2015   ID:  Michael [name], DOB [birth year], MRN [place]:[id number]  PCP:  No PCP Per Patient  Cardiologist:  Dr. Harrington Challenger    Chief Complaint  Patient presents with  . Shortness of Breath    wt up, has trouble lying down.      History of Present Illness: Michael [name] is a 54 y.o. male who presents for hospitalization follow up for a fib with RVR.  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   Echo yesterday revealed EF of 30-35%, mild LVH, MV area 0.89cm^2. LA severely dilated. RV mildly dilated with mild to mod reduced sys function, RA moderately dilated. Coreg changed to metoprolol as inpt. For improved rate control.  He was discharged with INR 1.45 on the 28th of OCT.  He was seen in ER on the 29th for SOB, but his BNP was better.  Pt from [place] but does speak English but difficult to understand.  He tells me he understands mostly what we are telling him.  He is unsure about his meds but has been taking his lasix.  He has not had INR check since discharge.    Today his wt is up at least 20 lbs, depending on wt from the 28th or 29th.  He does have SOB, at night if he lies down his SOB increases most of the time.  He also feel like he has a cold.    Pt did not have CABG only valve surgery.  Past Medical History  Diagnosis Date  . Coronary artery disease   . Diabetes mellitus without complication ([place])   . Hypertension   . CHF (congestive heart failure) ([place])   . 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 H/O tricuspid valve repair 2012  . Paroxysmal atrial fibrillation (H[facility])   . Diabetes mellitus  type 2 in obese ([place])   . Obstructive sleep apnea   . Chronic kidney disease (CKD), stage IV (severe) ([place])   . Anemia of chronic disease   . Pulmonary HTN ([place])     Past Surgical History  Procedure Laterality Date  . Cardiac surgery       Current Outpatient Prescriptions  Medication Sig Dispense Refill  . furosemide (LASIX) 80 MG tablet Take 1 tablet (80 mg total) by mouth daily. (Patient not taking: Reported on 09/15/2015) 40 tablet 0  . insulin glargine (LANTUS) 100 UNIT/ML injection Inject 0.2 mLs (20 Units total) into the skin at bedtime. 10 mL 11  . lisinopril (PRINIVIL,ZESTRIL) 10 MG tablet Take 10 mg by mouth daily.    . metoprolol (LOPRESSOR) 100 MG tablet Take 1 tablet (100 mg total) by mouth 2 (two) times daily. (Patient not taking: Reported on 09/15/2015) 60 tablet 1  . potassium chloride SA (K-DUR,KLOR-CON) 20 MEQ tablet Take 20 mEq by mouth 2 (two) times daily.    Marland Kitchen warfarin (COUMADIN) 5 MG tablet Take 1 tablet (5 mg total) by mouth daily. (Patient not taking: Reported on 09/15/2015) 10 tablet 0   No current facility-administered medications for this visit.    Allergies:   Review of patient's allergies indicates no known allergies.    Social History:  The patient  reports that he has been smoking.  He does not have any smokeless tobacco history on file. He reports that he does not drink alcohol or use illicit drugs.   Family History:  The patient's family history is not on file.  Unable to obtain today.   ROS:  General:no colds or fevers, + weight increase of 20-28 lbs Skin:no rashes or ulcers HEENT:no blurred vision, some congestion CV:see HPI PUL:see HPI GI:no diarrhea constipation or melena, no indigestion GU:no hematuria, no dysuria MS:no joint pain, no claudication Neuro:no syncope, no lightheadedness Endo:+ diabetes, no thyroid disease   Wt Readings from Last 3 Encounters:  10/09/15 228 lb (103.42 kg)  09/15/15 200 lb (90.719 kg)  09/14/15 209 lb  11.2 oz (95.119 kg)     PHYSICAL EXAM: VS:  BP 118/72 mmHg  Pulse 106  Ht 5\' 3"  (1.6 m)  Wt 228 lb (103.42 kg)  BMI 40.40 kg/m2  SpO2 96% , BMI Body mass index is 40.4 kg/(m^2). General:Pleasant affect, NAD, but SOB with talking Skin:Warm and dry, brisk capillary refill HEENT:normocephalic, sclera clear, mucus membranes moist Neck:supple, + JVD but very thick neck, no bruits  Heart:irreg irreg  without murmur, gallup, rub or click Lungs:dimiished without rales, rhonchi, or wheezes [id number] no pitting, non tender, + BS, do not palpate liver spleen or masses Ext:tr lower ext edema,  2+ radial pulses Neuro:alert and oriented X 3, MAE, follows commands, + facial symmetry    EKG:  EKG is ordered today. The ekg ordered today demonstrates a fib with RVR at rate 106.  ICRBBB, lateral T wave depression new from 09/15/15.     Recent Labs: 09/11/2015: Magnesium 2.0; TSH 1.463 09/15/2015: ALT 12*; B Natriuretic Peptide 305.5*; BUN 42*; Creatinine, Ser 2.72*; Hemoglobin 10.0*; Platelets 189; Potassium 4.5; Sodium 140    Lipid Panel No results found for: CHOL, TRIG, HDL, CHOLHDL, VLDL, LDLCALC, LDLDIRECT     Other studies Reviewed: Additional studies/ records that were reviewed today include: .  ECHO: Study Conclusions  - Left ventricle: The cavity size was normal. Wall thickness was increased in a pattern of mild LVH. Systolic function was moderately to severely reduced. The estimated ejection fraction was in the range of 30% to 35%. - Ventricular septum: The contour showed diastolic flattening. - Mitral valve: A bioprosthesis was present. Valve area by continuity equation (using LVOT flow): 0.89 cm^2. - Left atrium: The atrium was severely dilated. - Right ventricle: The cavity size was mildly dilated. Systolic function was mildly to moderately reduced. - Right atrium: The atrium was moderately dilated  ASSESSMENT AND PLAN:  1.  Acute systolic HF - admit to  tele, treat with IV lasix wt is up 20 lbs.   2. A fib with RVR, not sure if he was taking meds which may be part of the problem he was taking lasix.    3. Anticoagulation CHADS score 4 - will check INR if sub therapeutic use heparin crossover.  4.  Abnormal EKG, check troponin.  5. CKD -4 on ACE  May need to stop, will check labs  6 DM insulin dependent  Recent hgbA1c 7.8  Will add SSI  7 HTN controlled  8. Tobacco use, he is down to 2 cigarettes a day. Down from 1 ppd  9.  MVR with tissue valve, TV repair 2012   Reviewed with Dr. Meda Coffee will plan to admit.  Current medicines are reviewed with the patient today.  The patient Has no concerns regarding medicines.  The following changes have been made:  See above Labs/  tests ordered today include:see above  Disposition:   FU:  see above  Lennie Muckle, NP  10/09/2015 4:30 PM    [facility] [place], [place], [place], [place] Phone: [phone]; Fax: [phone]  Patient seen, examined. Available data reviewed. Agree with findings, assessment, and plan as outlined by Cecilie Kicks, NP. The patient is independently interviewed and examined. He is alert and oriented in NAD. Lungs with diffuse wheezing and rales. CV is distant, irregular, and tachycardic. Abdomen is obese, distended, and non-tender. There is 1+ bilateral pretibial edema. EKG shows atrial fibrillation with HR 103 bpm, incomplete RBBB, nonspecific ST&T abnormality.   Labs and XRay currently pending. I agree the patient has acute on chronic systolic heart failure with multiple comorbidities including previous valve surgery, atrial fibrillation with RVR, and CKD IV. Will diurese with IV lasix, check CXR, and obtain lab work. Further plans pending results of initial testing. Clinically he is volume overloaded. Will work on patient education but suspect compliance will  continue to be problematic. Will involve clinical social work/case management. Work on heart failure education. Otherwise as outlined above.   Sherren Mocha, M.D. 10/09/2015 5:39 PM

## 2015-10-09 NOTE — Progress Notes (Signed)
Cardiology Office Note   Date:  10/09/2015   ID:  Michael [name], Michael [name] [birth year], MRN [place]:[id number]  PCP:  No PCP Per Patient  Cardiologist:  Dr. Harrington Challenger    Chief Complaint  Patient presents with  . Shortness of Breath    wt up, has trouble lying down.      History of Present Illness: Michael [name] is a 54 y.o. male who presents for hospitalization follow up for a fib with RVR.  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   Echo yesterday revealed EF of 30-35%, mild LVH, MV area 0.89cm^2. LA severely dilated. RV mildly dilated with mild to mod reduced sys function, RA moderately dilated. Coreg changed to metoprolol as inpt. For improved rate control.  He was discharged with INR 1.45 on the 28th of OCT.  He was seen in ER on the 29th for SOB, but his BNP was better.  Pt from [place] but does speak English but difficult to understand.  He tells me he understands mostly what we are telling him.  He is unsure about his meds but has been taking his lasix.  He has not had INR check since discharge.    Today his wt is up at least 20 lbs, depending on wt from the 28th or 29th.  He does have SOB, at night if he lies down his SOB increases most of the time.  He also feel like he has a cold.    Pt did not have CABG only valve surgery.  Past Medical History  Diagnosis Date  . Coronary artery disease   . Diabetes mellitus without complication ([place])   . Hypertension   . CHF (congestive heart failure) ([place])   . 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 H/O tricuspid valve repair 2012  . Paroxysmal atrial fibrillation ([facility])   . Diabetes  mellitus type 2 in obese ([place])   . Obstructive sleep apnea   . Chronic kidney disease (CKD), stage IV (severe) ([place])   . Anemia of chronic disease   . Pulmonary HTN ([place])     Past Surgical History  Procedure Laterality Date  . Cardiac surgery       Current Outpatient Prescriptions  Medication Sig Dispense Refill  . furosemide (LASIX) 80 MG tablet Take 1 tablet (80 mg total) by mouth daily. (Patient not taking: Reported on 09/15/2015) 40 tablet 0  . insulin glargine (LANTUS) 100 UNIT/ML injection Inject 0.2 mLs (20 Units total) into the skin at bedtime. 10 mL 11  . lisinopril (PRINIVIL,ZESTRIL) 10 MG tablet Take 10 mg by mouth daily.    . metoprolol (LOPRESSOR) 100 MG tablet Take 1 tablet (100 mg total) by mouth 2 (two) times daily. (Patient not taking: Reported on 09/15/2015) 60 tablet 1  . potassium chloride SA (K-DUR,KLOR-CON) 20 MEQ tablet Take 20 mEq by mouth 2 (two) times daily.    Marland Kitchen warfarin (COUMADIN) 5 MG tablet Take 1 tablet (5 mg total) by mouth daily. (Patient not taking: Reported on 09/15/2015) 10 tablet 0   No current facility-administered medications for this visit.    Allergies:   Review of patient's allergies indicates no known allergies.    Social History:  The patient  reports that he has been smoking.  He does not have any smokeless tobacco history on file. He reports that he does not drink alcohol or use illicit drugs.   Family History:  The patient's family history is not on file.  Unable to obtain today.   ROS:  General:no colds or fevers, + weight increase of 20-28 lbs Skin:no rashes or ulcers HEENT:no blurred vision, some congestion CV:see HPI PUL:see HPI GI:no diarrhea constipation or melena, no indigestion GU:no hematuria, no dysuria MS:no joint pain, no claudication Neuro:no syncope, no lightheadedness Endo:+ diabetes, no thyroid disease   Wt Readings from Last 3 Encounters:  10/09/15 228 lb (103.42 kg)  09/15/15 200 lb (90.719 kg)  09/14/15  209 lb 11.2 oz (95.119 kg)     PHYSICAL EXAM: VS:  BP 118/72 mmHg  Pulse 106  Ht 5\' 3"  (1.6 m)  Wt 228 lb (103.42 kg)  BMI 40.40 kg/m2  SpO2 96% , BMI Body mass index is 40.4 kg/(m^2). General:Pleasant affect, NAD, but SOB with talking Skin:Warm and dry, brisk capillary refill HEENT:normocephalic, sclera clear, mucus membranes moist Neck:supple, + JVD but very thick neck, no bruits  Heart:irreg irreg  without murmur, gallup, rub or click Lungs:dimiished without rales, rhonchi, or wheezes [id number] no pitting, non tender, + BS, do not palpate liver spleen or masses Ext:tr lower ext edema,  2+ radial pulses Neuro:alert and oriented X 3, MAE, follows commands, + facial symmetry    EKG:  EKG is ordered today. The ekg ordered today demonstrates a fib with RVR at rate 106.  ICRBBB, lateral T wave depression new from 09/15/15.     Recent Labs: 09/11/2015: Magnesium 2.0; TSH 1.463 09/15/2015: ALT 12*; B Natriuretic Peptide 305.5*; BUN 42*; Creatinine, Ser 2.72*; Hemoglobin 10.0*; Platelets 189; Potassium 4.5; Sodium 140    Lipid Panel No results found for: CHOL, TRIG, HDL, CHOLHDL, VLDL, LDLCALC, LDLDIRECT     Other studies Reviewed: Additional studies/ records that were reviewed today include: .  ECHO: Study Conclusions  - Left ventricle: The cavity size was normal. Wall thickness was increased in a pattern of mild LVH. Systolic function was moderately to severely reduced. The estimated ejection fraction was in the range of 30% to 35%. - Ventricular septum: The contour showed diastolic flattening. - Mitral valve: A bioprosthesis was present. Valve area by continuity equation (using LVOT flow): 0.89 cm^2. - Left atrium: The atrium was severely dilated. - Right ventricle: The cavity size was mildly dilated. Systolic function was mildly to moderately reduced. - Right atrium: The atrium was moderately dilated  ASSESSMENT AND PLAN:  1.  Acute systolic HF - admit  to tele, treat with IV lasix wt is up 20 lbs.   2. A fib with RVR, not sure if he was taking meds which may be part of the problem he was taking lasix.    3. Anticoagulation CHADS score 4 - will check INR if sub therapeutic use heparin crossover.  4.  Abnormal EKG, check troponin.  5. CKD -4 on ACE  May need to stop, will check labs  6 DM insulin dependent  Recent hgbA1c 7.8  Will add SSI  7 HTN controlled   Current medicines are reviewed with the patient today.  The patient Has no concerns regarding medicines.  The following changes have been made:  See above Labs/ tests ordered today include:see above  Disposition:   FU:  see above  Lennie Muckle, NP  10/09/2015 4:30 PM    [place] [place],  [place], [place] Phone: [phone]; Fax: [phone]

## 2015-10-10 ENCOUNTER — Encounter (HOSPITAL_COMMUNITY): Payer: Self-pay

## 2015-10-10 ENCOUNTER — Inpatient Hospital Stay (HOSPITAL_COMMUNITY): Payer: Medicare Other

## 2015-10-10 LAB — PROTIME-INR
INR: 1.74 — ABNORMAL HIGH (ref 0.00–1.49)
Prothrombin Time: 20.3 seconds — ABNORMAL HIGH (ref 11.6–15.2)

## 2015-10-10 LAB — BASIC METABOLIC PANEL
Anion gap: 9 (ref 5–15)
BUN: 81 mg/dL — ABNORMAL HIGH (ref 6–20)
CO2: 23 mmol/L (ref 22–32)
Calcium: 8.8 mg/dL — ABNORMAL LOW (ref 8.9–10.3)
Chloride: 110 mmol/L (ref 101–111)
Creatinine, Ser: 3.4 mg/dL — ABNORMAL HIGH (ref 0.61–1.24)
GFR calc Af Amer: 22 mL/min — ABNORMAL LOW (ref >60)
GFR calc non Af Amer: 19 mL/min — ABNORMAL LOW (ref >60)
Glucose, Bld: 123 mg/dL — ABNORMAL HIGH (ref 65–99)
Potassium: 4.9 mmol/L (ref 3.5–5.1)
Sodium: 142 mmol/L (ref 135–145)

## 2015-10-10 LAB — CBC
HEMATOCRIT: 31.4 % — AB (ref 39.0–52.0)
HEMOGLOBIN: 9.9 g/dL — AB (ref 13.0–17.0)
MCH: 24.9 pg — ABNORMAL LOW (ref 26.0–34.0)
MCHC: 31.5 g/dL (ref 30.0–36.0)
MCV: 79.1 fL (ref 78.0–100.0)
Platelets: 242 10*3/uL (ref 150–400)
RBC: 3.97 MIL/uL — ABNORMAL LOW (ref 4.22–5.81)
RDW: 17.9 % — ABNORMAL HIGH (ref 11.5–15.5)
WBC: 8.6 10*3/uL (ref 4.0–10.5)

## 2015-10-10 LAB — GLUCOSE, CAPILLARY
Glucose-Capillary: 119 mg/dL — ABNORMAL HIGH (ref 65–99)
Glucose-Capillary: 440 mg/dL — ABNORMAL HIGH (ref 65–99)
Glucose-Capillary: 111 mg/dL — ABNORMAL HIGH (ref 65–99)
Glucose-Capillary: 81 mg/dL (ref 65–99)
Glucose-Capillary: 122 mg/dL — ABNORMAL HIGH (ref 65–99)

## 2015-10-10 LAB — HEPARIN LEVEL (UNFRACTIONATED)

## 2015-10-10 LAB — TROPONIN I: Troponin I: <0.03 ng/mL (ref <0.031)

## 2015-10-10 MED ORDER — HEPARIN (PORCINE) IN NACL 100-0.45 UNIT/ML-% IJ SOLN: 1600.0000 [IU]/h | INTRAMUSCULAR | Status: DC

## 2015-10-10 MED ORDER — INSULIN ASPART 100 UNIT/ML ~~LOC~~ SOLN: 0.0000 [IU] | Freq: Every day | SUBCUTANEOUS | Status: DC

## 2015-10-10 MED ORDER — INSULIN GLARGINE 100 UNIT/ML ~~LOC~~ SOLN
30.0000 [IU] | Freq: Every day | SUBCUTANEOUS | Status: DC
  Administered 2015-10-10 – 2015-10-16 (×7): 30 [IU] via SUBCUTANEOUS
  Filled 2015-10-10 (×8): qty 0.3

## 2015-10-10 MED ORDER — HEPARIN (PORCINE) IN NACL 100-0.45 UNIT/ML-% IJ SOLN
1000.0000 [IU]/h | INTRAMUSCULAR | Status: DC
  Administered 2015-10-10: 1000 [IU]/h via INTRAVENOUS
  Filled 2015-10-10: qty 250

## 2015-10-10 MED ORDER — INSULIN ASPART 100 UNIT/ML ~~LOC~~ SOLN: 0.0000 [IU] | Freq: Three times a day (TID) | SUBCUTANEOUS | Status: DC

## 2015-10-10 MED ORDER — WARFARIN SODIUM 7.5 MG PO TABS
7.5000 mg | ORAL_TABLET | Freq: Once | ORAL | Status: AC
  Administered 2015-10-10: 7.5 mg via ORAL
  Filled 2015-10-10: qty 1

## 2015-10-10 MED ORDER — INSULIN ASPART 100 UNIT/ML ~~LOC~~ SOLN
0.0000 [IU] | Freq: Three times a day (TID) | SUBCUTANEOUS | Status: DC
  Administered 2015-10-10: 20 [IU] via SUBCUTANEOUS
  Administered 2015-10-11: 4 [IU] via SUBCUTANEOUS
  Administered 2015-10-12: 11 [IU] via SUBCUTANEOUS
  Administered 2015-10-13: 3 [IU] via SUBCUTANEOUS
  Administered 2015-10-14: 4 [IU] via SUBCUTANEOUS
  Administered 2015-10-15: 3 [IU] via SUBCUTANEOUS

## 2015-10-10 NOTE — Progress Notes (Signed)
[place] for Heparin    Indication: atrial fibrillation ; heparin bridge to coumadin (until INR therapeutic).  No Known Allergies  Patient Measurements: Height: 5\' 3"  (160 cm) Weight: 223 lb 14.4 oz (101.56 kg) IBW/kg (Calculated) : 56.9  Heparin dosing weight: 80.2 kg  Vital Signs: Temp: 98 F (36.7 C) (11/23 1326) Temp Source: Oral (11/23 1326) BP: 108/79 mmHg (11/23 1326) Pulse Rate: 100 (11/23 1326)  Labs:  Recent Labs  10/09/15 1750 10/09/15 2230 10/10/15 0600 10/10/15 1811  HGB 10.3*  --   --  9.9*  HCT 33.3*  --   --  31.4*  PLT 238  --   --  242  APTT 40*  --   --   --   LABPROT 21.8*  --  20.3*  --   INR 1.91*  --  1.74*  --   HEPARINUNFRC  --   --   --  <0.10*  CREATININE 4.01*  --  3.40*  --   TROPONINI <0.03 <0.03 <0.03  --     Estimated Creatinine Clearance: 26 mL/min (by C-G formula based on Cr of 3.4).  Assessment: 54 yo male presented to [facility] on 10/09/15 with SOB/follow-up for afib. Admitted for Acute on chronic systolic CHF, Acute on CKD, Afib with RVR with subtherapeutic INR.   PMH: meth use, MVR porcine in 2012, Tricuspid valve repair, AFib on warfarin with noncompliance, CKD, OSA, DM, Anemia, HTN, Pulm HTN, HLD  AC: on warfarin pta for MVR/afib. Cardiologist notes that the patient was discharged with INR 1.45 on the 28th of OCT. He was seen in ER on the 29th for SOB, but his BNP was better. Patient is unsure about his meds but has been taking his lasix. He has not had INR check since discharge. INR subtherapeutic on admission and pt reported not taking his coumadin.   Today cardiologist notes pt is beathing better than yesterday and LEE improved per the patient.  Coumadin per pharmacy protocol.  Today INR has dropped further to 1.74 due to patient's noncompliance. Cardiology wants pharmacy to start IV heparin drip today and continue until INR therapeutic.   PTA dose 5 mg daily with pt stating not  taking  PM follow up - heparin level is undetectable on heparin 1000 units/hr.  Per RN there hasn't been any trouble with IV to his knowledge.  CBC, Pltc fairly stable.  Goal of Therapy:  Heparin level 0.3-0.7 units/ml INR 2-3 Monitor platelets by anticoagulation protocol: Yes   Plan:  Increase IV heparin to 1300 units/hr. Recheck heparin level with AM labs. Daily heparin level and CBC.  Uvaldo Rising, BCPS  Clinical Pharmacist Pager [phone]  10/10/2015 8:30 PM

## 2015-10-10 NOTE — Progress Notes (Signed)
Utilization review completed. Judye Lorino, RN, BSN. 

## 2015-10-10 NOTE — Progress Notes (Signed)
Pt blood sugar is 440 this morning. MD notified. Will continue to monitor.

## 2015-10-10 NOTE — Progress Notes (Signed)
Patient was restless overnight. His nose bled upon walking to the bathroom this morning. We used Vaseline to make the bleeding stop. Patient was ok afterwards.  Michael [name] H Michael [name]

## 2015-10-10 NOTE — Progress Notes (Signed)
Subjective: Breathing better than yesterday.  LEE improved per the patient  Objective: Vital signs in last 24 hours: Temp:  [97.3 F (36.3 C)-98.1 F (36.7 C)] 97.8 F (36.6 C) (11/23 0500) Pulse Rate:  [65-116] 116 (11/23 0500) Resp:  [18-20] 20 (11/23 0500) BP: (104-125)/(72-94) 115/72 mmHg (11/23 0500) SpO2:  [82 %-100 %] 100 % (11/23 0500) Weight:  [223 lb 14.4 oz (101.56 kg)-228 lb (103.42 kg)] 223 lb 14.4 oz (101.56 kg) (11/23 0500) Last BM Date: 10/09/15  Intake/Output from previous day: 11/22 0701 - 11/23 0700 In: 3 [I.V.:3] Out: 1400 [Urine:1400] Intake/Output this shift:    Medications Scheduled Meds: . furosemide  80 mg Intravenous Q12H  . insulin aspart  0-5 Units Subcutaneous QHS  . insulin aspart  0-9 Units Subcutaneous TID WC  . insulin glargine  20 Units Subcutaneous QHS  . metoprolol tartrate  100 mg Oral Q12H  . potassium chloride  20 mEq Oral TID  . sodium chloride  3 mL Intravenous Q12H  . Warfarin - Pharmacist Dosing Inpatient   Does not apply q1800   Continuous Infusions:  PRN Meds:.sodium chloride, acetaminophen, ondansetron (ZOFRAN) IV, sodium chloride  PE: General appearance: alert, cooperative, no distress and He was in a wheel chair on his way to xray Neck: Elevated JVD while sitting up. Lungs: clear to auscultation bilaterally Heart: irregularly irregular rhythm and No MRG Extremities: 1+ LEE Pulses: 2+ left radial 1+ right radial and 2+ DPs Skin: warm and dry Neurologic: Grossly normal  Lab Results:   Recent Labs  10/09/15 1750  WBC 7.7  HGB 10.3*  HCT 33.3*  PLT 238   BMET  Recent Labs  10/09/15 1750 10/10/15 0600  NA 140 142  K 5.1 4.9  CL 108 110  CO2 23 23  GLUCOSE 118* 123*  BUN 87* 81*  CREATININE 4.01* 3.40*  CALCIUM 8.8* 8.8*   PT/INR  Recent Labs  10/09/15 1750 10/10/15 0600  LABPROT 21.8* 20.3*  INR 1.91* 1.74*   Filed Weights   10/09/15 1705 10/09/15 1840 10/10/15 0500  Weight: 228 lb  (103.42 kg) 224 lb 13.9 oz (102 kg) 223 lb 14.4 oz (101.56 kg)    Assessment/Plan     Acute on chronic systolic CHF (congestive heart failure) (HCC) Net fluids: -1.4L.  Wt down to 223.   Was 46 on Oct 28 when discharged.  +JVD.  Continue with IV lasix at current dose.     Acute on CKD (chronic kidney disease), stage IV (HCC)  SCr improved this morning 4.01>>3.4.  Was 2.72 on Oct 28.  Potassium WNL.  On 20 meq TID.      SOB (shortness of breath): improving   Atrial fibrillation with rapid ventricular response (HCC) Rate is stable but not well controlled.  110-120's.  INR subtherapeutic, 1.74.  Add IV heparin.  Metoprolol 100 bid.  Consider increasing to 125 BID    DM type 2 (diabetes mellitus, type 2) (HCC)  Continue current insulin   Essential hypertension  Controlled   MVR with tissue valve, TV repair 2012   Tricuspid valve disease   Tobacco abuse   LOS: 1 day    HAGER, BRYAN PA-C 10/10/2015 8:00 AM  Patient seen, examined. Available data reviewed. Agree with findings, assessment, and plan as outlined by Tarri Fuller, PA-C.  The patient is clinically improved. His breathing is less labored. He continues to show evidence of increased jugular venous pressure , decreased breath sounds in the bases, and abdominal edema.  Renal function is improving. We will continue diuresis with IV Lasix. Warfarin adjustment per pharmacy for subtherapeutic INR. Recheck labs tomorrow morning. Further patient education regarding heart failure done.  I agree with holding his ACE inhibitor while he is being aggressively diuresed in the setting of advanced kidney disease and diabetes. This will decrease his risk of further kidney injury in the acute setting.  Sherren Mocha, M.D. 10/10/2015 10:28 AM

## 2015-10-10 NOTE — Progress Notes (Signed)
ANTICOAGULATION CONSULT NOTE - Initial Consult  Pharmacy Consult for Heparin (Initial consult) and Follow up on Coumadin    Indication: atrial fibrillation ; heparin bridge to coumadin (until INR therapeutic.  No Known Allergies  Patient Measurements: Height: 5\' 3"  (160 cm) Weight: 223 lb 14.4 oz (101.56 kg) IBW/kg (Calculated) : 56.9  Heparin dosing weight: 80.2 kg  Vital Signs: Temp: 97.8 F (36.6 C) (11/23 0500) Temp Source: Oral (11/23 0500) BP: 115/72 mmHg (11/23 0500) Pulse Rate: 116 (11/23 0500)  Labs:  Recent Labs  10/09/15 1750 10/09/15 2230 10/10/15 0600  HGB 10.3*  --   --   HCT 33.3*  --   --   PLT 238  --   --   APTT 40*  --   --   LABPROT 21.8*  --  20.3*  INR 1.91*  --  1.74*  CREATININE 4.01*  --  3.40*  TROPONINI <0.03 <0.03 <0.03    Estimated Creatinine Clearance: 26 mL/min (by C-G formula based on Cr of 3.4).  Assessment: 54 yo male presented to [facility] on 10/09/15 with SOB/follow-up for afib. Admitted for Acute on chronic systolic CHF, Acute on CKD, Afib with RVR with subtherapeutic INR.   PMH: meth use, MVR porcine in 2012, Tricuspid valve repair, AFib on warfarin with noncompliance, CKD, OSA, DM, Anemia, HTN, Pulm HTN, HLD  AC: on warfarin pta for MVR/afib. Cardiologist notes that the patient was discharged with INR 1.45 on the 28th of OCT. He was seen in ER on the 29th for SOB, but his BNP was better. Patient is unsure about his meds but has been taking his lasix. He has not had INR check since discharge. INR subtherapeutic on admission and pt reported not taking his coumadin.   Today cardiologist notes pt is beathing better than yesterday and LEE improved per the patient.  Coumadin per pharmacy protocol.  Today INR has dropped further to 1.74 due to patient's noncompliance. Cardiology wants pharmacy to start IV heparin drip today and continue until INR therapeutic.   PTA dose 5 mg daily with pt stating not taking   Renal: SCr 3.4 Heme: H&H  10.3/33.3, Plt 238 on 10/09/15. RN re;ported this AM that  patient's nose bled upon walking to the bathroom this morning. We used Vaseline to make the bleeding stop. Patient was ok afterwards. No other bleeding noted.   Goal of Therapy:  Heparin level 0.3-0.7 units/ml INR 2-3 Monitor platelets by anticoagulation protocol: Yes   Plan:  Start IV heparin drip at 1000 units/hr (~12 units/kg/hr).  No bolus. Warfarin 7.5 mg x 1 Daily INR, CBC q72h Monitor for s/sx of bleeding  Nicole Cella, RPh Clinical Pharmacist Pager: [phone] 10/10/2015 8:30 AM

## 2015-10-10 NOTE — Hospital Discharge Follow-Up (Signed)
Transitional Care Clinic Care Coordination Note:  Admit date:  10/09/15 Discharge date: TBD Discharge Disposition: Home when stable. Per Marvetta Gibbons, patient will need PT/INR follow-up with [facility]. Patient contact: [phone] (cell) Emergency contact(s): Pattie ([phone]  This Case Manager reviewed patient's EMR and determined patient would benefit from post-discharge medical management and chronic care management services through the [facility]. Patient has a history of chronic systolic congestive heart failure, CKD Stage IV, atrial fibrillation with RVR, Type 2 DM, HTN. This Case Manager met with patient to discuss the services and medical management that can be provided at the [facility]. Patient verbalized understanding and agreed to receive post-discharge care at the [facility]. [employer] used for interpretation ([place] interpreter # [phone])  Patient scheduled for Transitional Care appointment on 10/22/15 at 1430 with Dr. Jarold Song.  Clinic information and appointment time provided to patient. Appointment information also placed on AVS.  Assessment:       Home Environment: Patient lives alone in a private residence.       Support System: daughter-Pattie       Level of functioning: Independent       Home DME: none       Home care services: none       Transportation: Patient indicated his daughter takes him to his medical appointments.         Food/Nutrition: Patient indicated he cooks his own meals. He also indicated he is able to afford needed food.        Medications: Patient indicated he gets his medications from [facility]. He denied any problems affording medications or copays.        Identified Barriers: lack of PCP, language barrier-speaks Laotian, medical noncompliance        PCP: none. Patient will need to establish care with a [place] PCP after 30 days of medical  management with the [facility].             Arranged services:        Services communicated to Marvetta Gibbons, RN CM

## 2015-10-10 NOTE — Care Management Note (Signed)
Case Management Note Marvetta Gibbons RN, BSN Unit 2W-Case Manager [phone]  Patient Details  Name: Michael [name] MRN: [id number] Date of Birth: 11/13/1961  Subjective/Objective:  Pt admitted with CHF                  Action/Plan: PTA pt lived at home- will need PCP arrangements as pt has not been very compliant with follow post last admission. have spoken with [facility]- liaison Carmela Hurt- who will come see pt about possible f/u with clinic. Pt will need to continue to have PT/INR checks arranged with coumadin clinic.  NCM to follow for further d/c needs  Expected Discharge Date:                  Expected Discharge Plan:  Home/Self Care  In-House Referral:     Discharge planning Services  CM Consult  Post Acute Care Choice:    Choice offered to:     DME Arranged:    DME Agency:     HH Arranged:    HH Agency:     Status of Service:  In process, will continue to follow  Medicare Important Message Given:    Date Medicare IM Given:    Medicare IM give by:    Date Additional Medicare IM Given:    Additional Medicare Important Message give by:     If discussed at [place] of Stay Meetings, dates discussed:    Additional Comments:  Dawayne Patricia, RN 10/10/2015, 2:27 PM

## 2015-10-10 NOTE — Progress Notes (Signed)
Pt blood sugar was 440. MD notified and sliding scale was changed. Pt current blood sugar is 111. Will continue to monitor

## 2015-10-11 DIAGNOSIS — I4891 Unspecified atrial fibrillation: Secondary | ICD-10-CM

## 2015-10-11 LAB — GLUCOSE, CAPILLARY
Glucose-Capillary: 97 mg/dL (ref 65–99)
Glucose-Capillary: 159 mg/dL — ABNORMAL HIGH (ref 65–99)
Glucose-Capillary: 113 mg/dL — ABNORMAL HIGH (ref 65–99)
Glucose-Capillary: 120 mg/dL — ABNORMAL HIGH (ref 65–99)

## 2015-10-11 LAB — PROTIME-INR
INR: 2.09 — ABNORMAL HIGH (ref 0.00–1.49)
Prothrombin Time: 23.3 seconds — ABNORMAL HIGH (ref 11.6–15.2)

## 2015-10-11 LAB — CBC
HCT: 30.7 % — ABNORMAL LOW (ref 39.0–52.0)
Hemoglobin: 9.7 g/dL — ABNORMAL LOW (ref 13.0–17.0)
MCH: 24.9 pg — AB (ref 26.0–34.0)
MCHC: 31.6 g/dL (ref 30.0–36.0)
MCV: 78.9 fL (ref 78.0–100.0)
PLATELETS: 238 10*3/uL (ref 150–400)
RBC: 3.89 MIL/uL — ABNORMAL LOW (ref 4.22–5.81)
RDW: 17.8 % — ABNORMAL HIGH (ref 11.5–15.5)
WBC: 8.2 10*3/uL (ref 4.0–10.5)

## 2015-10-11 LAB — HEPARIN LEVEL (UNFRACTIONATED): HEPARIN UNFRACTIONATED: 0.24 [IU]/mL — AB (ref 0.30–0.70)

## 2015-10-11 LAB — BASIC METABOLIC PANEL
Anion gap: 9 (ref 5–15)
BUN: 66 mg/dL — ABNORMAL HIGH (ref 6–20)
CO2: 24 mmol/L (ref 22–32)
Calcium: 8.9 mg/dL (ref 8.9–10.3)
Chloride: 109 mmol/L (ref 101–111)
Creatinine, Ser: 2.75 mg/dL — ABNORMAL HIGH (ref 0.61–1.24)
GFR calc Af Amer: 28 mL/min — ABNORMAL LOW (ref >60)
GFR calc non Af Amer: 24 mL/min — ABNORMAL LOW (ref >60)
Glucose, Bld: 109 mg/dL — ABNORMAL HIGH (ref 65–99)
Potassium: 4.4 mmol/L (ref 3.5–5.1)
Sodium: 142 mmol/L (ref 135–145)

## 2015-10-11 MED ORDER — TRAMADOL HCL 50 MG PO TABS
50.0000 mg | ORAL_TABLET | Freq: Four times a day (QID) | ORAL | Status: DC | PRN
  Administered 2015-10-12 – 2015-10-17 (×12): 50 mg via ORAL
  Filled 2015-10-11 (×12): qty 1

## 2015-10-11 MED ORDER — METOPROLOL TARTRATE 25 MG PO TABS
125.0000 mg | ORAL_TABLET | Freq: Two times a day (BID) | ORAL | Status: DC
  Administered 2015-10-11 (×2): 125 mg via ORAL
  Filled 2015-10-11 (×2): qty 1

## 2015-10-11 MED ORDER — WARFARIN SODIUM 5 MG PO TABS
5.0000 mg | ORAL_TABLET | Freq: Once | ORAL | Status: AC
  Administered 2015-10-11: 5 mg via ORAL
  Filled 2015-10-11: qty 1

## 2015-10-11 NOTE — Progress Notes (Signed)
ANTICOAGULATION CONSULT NOTE - Follow Up Consult  Pharmacy Consult for heparin Indication: atrial fibrillation   Labs:  Recent Labs  10/09/15 1750 10/09/15 2230 10/10/15 0600 10/10/15 1811 10/11/15 0418  HGB 10.3*  --   --  9.9* 9.7*  HCT 33.3*  --   --  31.4* 30.7*  PLT 238  --   --  242 238  APTT 40*  --   --   --   --   LABPROT 21.8*  --  20.3*  --  23.3*  INR 1.91*  --  1.74*  --  2.09*  HEPARINUNFRC  --   --   --  <0.10* 0.24*  CREATININE 4.01*  --  3.40*  --  2.75*  TROPONINI <0.03 <0.03 <0.03  --   --      Assessment: 54yo male subtherapeutic on heparin after rate increase.  Goal of Therapy:  Heparin level 0.3-0.7 units/ml   Plan:  Was recently therapeutic on heparin at 1700 units/hr; since level isn't too far from goal will increase gtt not quite as aggressively to 1600 units/hr and check level in [place], PharmD, BCPS  10/11/2015,5:44 AM

## 2015-10-11 NOTE — Progress Notes (Signed)
ANTICOAGULATION CONSULT NOTE - Follow up Pharmacy Consult for Coumadin   Indication: atrial fibrillation ; heparin bridge to coumadin (until INR therapeutic.  No Known Allergies  Patient Measurements: Height: 5\' 3"  (160 cm) Weight: 220 lb (99.791 kg) IBW/kg (Calculated) : 56.9  Heparin dosing weight: 80.2 kg  Vital Signs: Temp: 97.7 F (36.5 C) (11/24 0454) Temp Source: Oral (11/24 0454) BP: 137/91 mmHg (11/24 0454) Pulse Rate: 126 (11/24 0454)  Labs:  Recent Labs  10/09/15 1750 10/09/15 2230 10/10/15 0600 10/10/15 1811 10/11/15 0418  HGB 10.3*  --   --  9.9* 9.7*  HCT 33.3*  --   --  31.4* 30.7*  PLT 238  --   --  242 238  APTT 40*  --   --   --   --   LABPROT 21.8*  --  20.3*  --  23.3*  INR 1.91*  --  1.74*  --  2.09*  HEPARINUNFRC  --   --   --  <0.10* 0.24*  CREATININE 4.01*  --  3.40*  --  2.75*  TROPONINI <0.03 <0.03 <0.03  --   --     Estimated Creatinine Clearance: 31.8 mL/min (by C-G formula based on Cr of 2.75).  Assessment: 54 yo male presented to [facility] on 10/09/15 with SOB/follow-up for afib. Admitted for Acute on chronic systolic CHF, Acute on CKD, Afib with RVR with subtherapeutic INR.   PMH: meth use, MVR porcine in 2012, Tricuspid valve repair, AFib on warfarin with noncompliance, CKD, OSA, DM, Anemia, HTN, Pulm HTN, HLD  AC: on warfarin pta for MVR/afib. Cardiologist notes that the patient was discharged with INR 1.45 on the 28th of OCT. He was seen in ER on the 29th for SOB, but his BNP was better. Patient is unsure about his meds but has been taking his lasix. He has not had INR check since discharge. INR subtherapeutic on admission and pt reported not taking his coumadin.   Today his  INR is therapeutic at 2.09.  We have discontinued the  IV heparin drip due to INR therapeutic.   PTA dose 5 mg daily with pt stating not taking (noncompliance suspect duet to language barrier limiting patient's understanding of previous medical  instructions)..   Renal: SCr 4.0>3.4>2.75 Heme: H&H 9.7/30.7, Plt 238 today. Yesterday RN reported  patient's nose bled upon walking to the bathroom- felt likely due to nasa dryness on O2 [place]. Used Vaseline to make the bleeding stop. Patient was ok afterwards. No other bleeding noted.   Goal of Therapy:  Heparin level 0.3-0.7 units/ml INR 2-3 Monitor platelets by anticoagulation protocol: Yes   Plan:  Discontinued heparin drip this AM due to therapeutic INR . Warfarin 5 mg x 1 Daily INR, CBC q72h Monitor for s/sx of bleeding  Nicole Cella, RPh Clinical Pharmacist Pager: [phone] 10/11/2015 9:03 AM

## 2015-10-11 NOTE — Progress Notes (Signed)
ANTICOAGULATION CONSULT NOTE - Follow up Pharmacy Consult for Coumadin   Indication: atrial fibrillation ; heparin bridge to coumadin (until INR therapeutic.  No Known Allergies  Patient Measurements: Height: 5\' 3"  (160 cm) Weight: 220 lb (99.791 kg) IBW/kg (Calculated) : 56.9  Heparin dosing weight: 80.2 kg  Vital Signs: Temp: 97.7 F (36.5 C) (11/24 0454) Temp Source: Oral (11/24 0454) BP: 137/91 mmHg (11/24 0454) Pulse Rate: 126 (11/24 0454)  Labs:  Recent Labs  10/09/15 1750 10/09/15 2230 10/10/15 0600 10/10/15 1811 10/11/15 0418  HGB 10.3*  --   --  9.9* 9.7*  HCT 33.3*  --   --  31.4* 30.7*  PLT 238  --   --  242 238  APTT 40*  --   --   --   --   LABPROT 21.8*  --  20.3*  --  23.3*  INR 1.91*  --  1.74*  --  2.09*  HEPARINUNFRC  --   --   --  <0.10* 0.24*  CREATININE 4.01*  --  3.40*  --  2.75*  TROPONINI <0.03 <0.03 <0.03  --   --     Estimated Creatinine Clearance: 31.8 mL/min (by C-G formula based on Cr of 2.75).  Assessment: 54 yo male presented to [facility] on 10/09/15 with SOB/follow-up for afib. Admitted for Acute on chronic systolic CHF, Acute on CKD, Afib with RVR with subtherapeutic INR.   PMH: meth use, MVR porcine in 2012, Tricuspid valve repair, AFib on warfarin with noncompliance, CKD, OSA, DM, Anemia, HTN, Pulm HTN, HLD  AC: on warfarin pta for MVR/afib. Cardiologist notes that the patient was discharged with INR 1.45 on the 28th of OCT. He was seen in ER on the 29th for SOB, but his BNP was better. Patient is unsure about his meds but has been taking his lasix. He has not had INR check since discharge. INR subtherapeutic on admission and pt reported not taking his coumadin.   Today his  INR is therapeutic at 2.09.  We have discontinued the  IV heparin drip due to INR therapeutic.   PTA dose 5 mg daily with pt stating not taking (noncompliance suspect duet to language barrier limiting patient's understanding of previous medical  instructions)..   Renal: SCr 4.0>3.4>2.75 Heme: H&H 9.7/30.7, Plt 238 today. Yesterday RN reported  patient's nose bled upon walking to the bathroom- felt likely due to nasa dryness on O2 . Used Vaseline to make the bleeding stop. Patient was ok afterwards. No other bleeding noted.   Goal of Therapy:  INR 2-3 Monitor platelets by anticoagulation protocol: Yes   Plan:  Discontinued heparin drip this AM due to therapeutic INR . Warfarin 5 mg x 1 Daily INR, CBC q72h Monitor for s/sx of bleeding  Nicole Cella, RPh Clinical Pharmacist Pager: [phone] 10/11/2015 9:33 AM

## 2015-10-11 NOTE — Progress Notes (Addendum)
Subjective: No CP  Breathing OK  Leg (R ) cramping.  Objective: Filed Vitals:   10/10/15 1057 10/10/15 1326 10/10/15 2122 10/11/15 0454  BP:  108/79 122/79 137/91  Pulse:  100 114 126  Temp:  98 F (36.7 C) 97.8 F (36.6 C) 97.7 F (36.5 C)  TempSrc:  Oral Oral Oral  Resp:  18 18 18   Height:      Weight:    99.791 kg (220 lb)  SpO2: 100% 95% 97% 97%   Weight change: -3.629 kg (-8 lb)  Intake/Output Summary (Last 24 hours) at 10/11/15 0906 Last data filed at 10/10/15 2129  Gross per 24 hour  Intake   1200 ml  Output    800 ml  Net    400 ml   I/O incomplete   General: Alert, awake, oriented x3, Complaining of thigh pain   Neck:  JVP is normal Heart: Regular rate and rhythm, without murmurs, rubs, gallops.  Lungs: Clear to auscultation.  No rales or wheezes. Exemities:  Tr edema.   Neuro: Grossly intact, nonfocal.  Tele  Afib  100s to 120s Lab Results: Results for orders placed or performed during the hospital encounter of 10/09/15 (from the past 24 hour(s))  Glucose, capillary     Status: Abnormal   Collection Time: 10/10/15 11:26 AM  Result Value Ref Range   Glucose-Capillary 440 (H) 65 - 99 mg/dL   Comment 1 Notify RN   Glucose, capillary     Status: Abnormal   Collection Time: 10/10/15  1:27 PM  Result Value Ref Range   Glucose-Capillary 111 (H) 65 - 99 mg/dL  Glucose, capillary     Status: None   Collection Time: 10/10/15  4:59 PM  Result Value Ref Range   Glucose-Capillary 81 65 - 99 mg/dL  Heparin level (unfractionated)     Status: Abnormal   Collection Time: 10/10/15  6:11 PM  Result Value Ref Range   Heparin Unfractionated <0.10 (L) 0.30 - 0.70 IU/mL  CBC     Status: Abnormal   Collection Time: 10/10/15  6:11 PM  Result Value Ref Range   WBC 8.6 4.0 - 10.5 K/uL   RBC 3.97 (L) 4.22 - 5.81 MIL/uL   Hemoglobin 9.9 (L) 13.0 - 17.0 g/dL   HCT 31.4 (L) 39.0 - 52.0 %   MCV 79.1 78.0 - 100.0 fL   MCH 24.9 (L) 26.0 - 34.0 pg   MCHC 31.5 30.0 - 36.0  g/dL   RDW 17.9 (H) 11.5 - 15.5 %   Platelets 242 150 - 400 K/uL  Glucose, capillary     Status: Abnormal   Collection Time: 10/10/15  9:18 PM  Result Value Ref Range   Glucose-Capillary 122 (H) 65 - 99 mg/dL   Comment 1 Notify RN    Comment 2 Document in Chart   Basic metabolic panel     Status: Abnormal   Collection Time: 10/11/15  4:18 AM  Result Value Ref Range   Sodium 142 135 - 145 mmol/L   Potassium 4.4 3.5 - 5.1 mmol/L   Chloride 109 101 - 111 mmol/L   CO2 24 22 - 32 mmol/L   Glucose, Bld 109 (H) 65 - 99 mg/dL   BUN 66 (H) 6 - 20 mg/dL   Creatinine, Ser 2.75 (H) 0.61 - 1.24 mg/dL   Calcium 8.9 8.9 - 10.3 mg/dL   GFR calc non Af Amer 24 (L) >60 mL/min   GFR calc Af Amer 28 (L) >60  mL/min   Anion gap 9 5 - 15  CBC     Status: Abnormal   Collection Time: 10/11/15  4:18 AM  Result Value Ref Range   WBC 8.2 4.0 - 10.5 K/uL   RBC 3.89 (L) 4.22 - 5.81 MIL/uL   Hemoglobin 9.7 (L) 13.0 - 17.0 g/dL   HCT 30.7 (L) 39.0 - 52.0 %   MCV 78.9 78.0 - 100.0 fL   MCH 24.9 (L) 26.0 - 34.0 pg   MCHC 31.6 30.0 - 36.0 g/dL   RDW 17.8 (H) 11.5 - 15.5 %   Platelets 238 150 - 400 K/uL  Heparin level (unfractionated)     Status: Abnormal   Collection Time: 10/11/15  4:18 AM  Result Value Ref Range   Heparin Unfractionated 0.24 (L) 0.30 - 0.70 IU/mL  Protime-INR     Status: Abnormal   Collection Time: 10/11/15  4:18 AM  Result Value Ref Range   Prothrombin Time 23.3 (H) 11.6 - 15.2 seconds   INR 2.09 (H) 0.00 - 1.49  Glucose, capillary     Status: None   Collection Time: 10/11/15  6:06 AM  Result Value Ref Range   Glucose-Capillary 97 65 - 99 mg/dL    Studies/Results: No results found.  Medications:Reviewed  @PROBHOSP @  1  Afib  Rates still not controlled  WOuld increase metoprolol to 125 bid  Next dose at noon Continue coumadin  INR greater than 2  2  Acute on chronic systoilc CHF  Volume status appears to be up a little  COntine diureissi  3.  Renal  Cr actually better  than yesterday  Follow  4  Leg cramping  Follow  Electrolytes OK  On coumadin.  Close to D/C   Need HR better   I am concerned about compliance as outpt.      LOS: 2 days   [name] 10/11/2015, 9:06 AM

## 2015-10-12 LAB — GLUCOSE, CAPILLARY
Glucose-Capillary: 96 mg/dL (ref 65–99)
Glucose-Capillary: 293 mg/dL — ABNORMAL HIGH (ref 65–99)
Glucose-Capillary: 102 mg/dL — ABNORMAL HIGH (ref 65–99)
Glucose-Capillary: 93 mg/dL (ref 65–99)

## 2015-10-12 LAB — BASIC METABOLIC PANEL
Anion gap: 6 (ref 5–15)
BUN: 55 mg/dL — ABNORMAL HIGH (ref 6–20)
CO2: 27 mmol/L (ref 22–32)
Calcium: 8.9 mg/dL (ref 8.9–10.3)
Chloride: 106 mmol/L (ref 101–111)
Creatinine, Ser: 2.63 mg/dL — ABNORMAL HIGH (ref 0.61–1.24)
GFR calc Af Amer: 30 mL/min — ABNORMAL LOW (ref >60)
GFR calc non Af Amer: 26 mL/min — ABNORMAL LOW (ref >60)
Glucose, Bld: 126 mg/dL — ABNORMAL HIGH (ref 65–99)
Potassium: 4.6 mmol/L (ref 3.5–5.1)
Sodium: 139 mmol/L (ref 135–145)

## 2015-10-12 LAB — PROTIME-INR
INR: 2.49 — ABNORMAL HIGH (ref 0.00–1.49)
Prothrombin Time: 26.6 seconds — ABNORMAL HIGH (ref 11.6–15.2)

## 2015-10-12 MED ORDER — WARFARIN SODIUM 2.5 MG PO TABS
2.5000 mg | ORAL_TABLET | Freq: Once | ORAL | Status: AC
  Administered 2015-10-12: 2.5 mg via ORAL
  Filled 2015-10-12: qty 1

## 2015-10-12 MED ORDER — ISOSORBIDE MONONITRATE ER 30 MG PO TB24
30.0000 mg | ORAL_TABLET | Freq: Every day | ORAL | Status: DC
  Administered 2015-10-12 – 2015-10-17 (×6): 30 mg via ORAL
  Filled 2015-10-12 (×6): qty 1

## 2015-10-12 MED ORDER — METOPROLOL TARTRATE 50 MG PO TABS
150.0000 mg | ORAL_TABLET | Freq: Two times a day (BID) | ORAL | Status: DC
Start: 2015-10-12 — End: 2015-10-17
  Administered 2015-10-12 – 2015-10-17 (×10): 150 mg via ORAL
  Filled 2015-10-12 (×11): qty 1

## 2015-10-12 MED ORDER — HYDRALAZINE HCL 10 MG PO TABS
10.0000 mg | ORAL_TABLET | Freq: Three times a day (TID) | ORAL | Status: DC
  Administered 2015-10-12 – 2015-10-17 (×15): 10 mg via ORAL
  Filled 2015-10-12 (×16): qty 1

## 2015-10-12 NOTE — Progress Notes (Signed)
ANTICOAGULATION CONSULT NOTE - Follow up Pharmacy Consult for Coumadin   Indication: atrial fibrillation   No Known Allergies  Patient Measurements: Height: 5\' 3"  (160 cm) Weight: 218 lb 8 oz (99.111 kg) IBW/kg (Calculated) : 56.9  Heparin dosing weight: 80.2 kg  Vital Signs: Temp: 98.4 F (36.9 C) (11/25 0517) Temp Source: Oral (11/25 0517) BP: 136/91 mmHg (11/25 0517) Pulse Rate: 106 (11/25 0517)  Labs:  Recent Labs  10/09/15 1750 10/09/15 2230 10/10/15 0600 10/10/15 1811 10/11/15 0418 10/12/15 0219  HGB 10.3*  --   --  9.9* 9.7*  --   HCT 33.3*  --   --  31.4* 30.7*  --   PLT 238  --   --  242 238  --   APTT 40*  --   --   --   --   --   LABPROT 21.8*  --  20.3*  --  23.3* 26.6*  INR 1.91*  --  1.74*  --  2.09* 2.49*  HEPARINUNFRC  --   --   --  <0.10* 0.24*  --   CREATININE 4.01*  --  3.40*  --  2.75* 2.63*  TROPONINI <0.03 <0.03 <0.03  --   --   --     Estimated Creatinine Clearance: 33.1 mL/min (by C-G formula based on Cr of 2.63).  Assessment: 54 yo male presented to [facility] on 10/09/15 with SOB/follow-up for afib. Admitted for Acute on chronic systolic CHF, Acute on CKD, Afib with RVR with subtherapeutic INR.   PMH: meth use, MVR porcine in 2012, Tricuspid valve repair, AFib on warfarin with noncompliance, CKD, OSA, DM, Anemia, HTN, Pulm HTN, HLD  AC: on warfarin pta for MVR/afib.  INR today continues to be therapeutic at 2.49 (significant trend up over the past few days).    PTA dose 5 mg daily with pt stating not taking (noncompliance suspect duet to language barrier limiting patient's understanding of previous medical instructions)..  Renal: SCr 4.0>3.4>2.75>2.6 Heme: H&H 9.7/30.7, Plt 238   No bleeding/epistaxis noted.  Goal of Therapy:  INR 2-3 Monitor platelets by anticoagulation protocol: Yes   Plan:  Warfarin 2.5 mg x 1 Daily INR Monitor for s/sx of bleeding  Erin Hearing PharmD., BCPS Clinical Pharmacist Pager [phone] 10/12/2015 8:34  AM

## 2015-10-12 NOTE — Discharge Instructions (Signed)

## 2015-10-12 NOTE — Progress Notes (Signed)
Patient Name: Michael [name] Date of Encounter: 10/12/2015  Primary Cardiologist: Dr. Harrington Challenger   Principal Problem:   Acute on chronic systolic CHF (congestive heart failure) (HCC) Active Problems:   CKD (chronic kidney disease), stage IV (HCC)   SOB (shortness of breath)   Atrial fibrillation with rapid ventricular response ([place])   DM type 2 (diabetes mellitus, type 2) ([place])   Essential hypertension   Mitral valve disease   Systolic heart failure (HCC)   Tricuspid valve disease    SUBJECTIVE  SOB improving. LE edema also improving. Does not seems to understand he need to have coumadin level checked.   CURRENT MEDS . furosemide  80 mg Intravenous Q12H  . insulin aspart  0-20 Units Subcutaneous TID WC  . insulin aspart  0-5 Units Subcutaneous QHS  . insulin glargine  30 Units Subcutaneous QHS  . metoprolol tartrate  150 mg Oral Q12H  . potassium chloride  20 mEq Oral TID  . sodium chloride  3 mL Intravenous Q12H  . warfarin  2.5 mg Oral ONCE-1800  . Warfarin - Pharmacist Dosing Inpatient   Does not apply q1800    OBJECTIVE  Filed Vitals:   10/11/15 1407 10/11/15 1558 10/11/15 2115 10/12/15 0517  BP: 105/81 115/92 138/92 136/91  Pulse: 76 107 120 106  Temp: 97.8 F (36.6 C)  98.1 F (36.7 C) 98.4 F (36.9 C)  TempSrc: Oral  Oral Oral  Resp: 18  18 16   Height:      Weight:    218 lb 8 oz (99.111 kg)  SpO2: 98% 97% 99% 94%    Intake/Output Summary (Last 24 hours) at 10/12/15 0904 Last data filed at 10/12/15 [id number]  Gross per 24 hour  Intake 1371.27 ml  Output      0 ml  Net 1371.27 ml   Filed Weights   10/10/15 0500 10/11/15 0454 10/12/15 0517  Weight: 223 lb 14.4 oz (101.56 kg) 220 lb (99.791 kg) 218 lb 8 oz (99.111 kg)    PHYSICAL EXAM  General: Pleasant, NAD. Neuro: Alert and oriented X 3. Moves all extremities spontaneously. Psych: Normal affect. HEENT:  Normal  Neck: Supple without bruits or JVD. Lungs:  Resp regular and unlabored,  CTA. Heart: RRR no s3, s4, or murmurs. Abdomen: Soft, non-tender, non-distended, BS + x 4.  Extremities: No clubbing, cyanosis or edema. DP/PT/Radials 2+ and equal bilaterally.  Accessory Clinical Findings  CBC  Recent Labs  10/09/15 1750 10/10/15 1811 10/11/15 0418  WBC 7.7 8.6 8.2  NEUTROABS 5.2  --   --   HGB 10.3* 9.9* 9.7*  HCT 33.3* 31.4* 30.7*  MCV 79.9 79.1 78.9  PLT 238 242 [id number]   Basic Metabolic Panel  Recent Labs  10/11/15 0418 10/12/15 0219  NA 142 139  K 4.4 4.6  CL 109 106  CO2 24 27  GLUCOSE 109* 126*  BUN 66* 55*  CREATININE 2.75* 2.63*  CALCIUM 8.9 8.9   Liver Function Tests  Recent Labs  10/09/15 1750  AST 24  ALT 10*  ALKPHOS 102  BILITOT 1.0  PROT 6.6  ALBUMIN 2.9*   Cardiac Enzymes  Recent Labs  10/09/15 1750 10/09/15 2230 10/10/15 0600  TROPONINI <0.03 <0.03 <0.03   Thyroid Function Tests  Recent Labs  10/09/15 1750  TSH 3.289    TELE afib with HR low 100s    ECG  No new EKG  Echocardiogram 09/11/2015  LV EF: 30% -  35%  ------------------------------------------------------------------- Indications:   Atrial  fibrillation - 427.31.  ------------------------------------------------------------------- History:  PMH:  Dyspnea. Risk factors: Mitral valve replacement. Chronic kidney disease. Hypertension. Diabetes mellitus.  ------------------------------------------------------------------- Study Conclusions  - Left ventricle: The cavity size was normal. Wall thickness was increased in a pattern of mild LVH. Systolic function was moderately to severely reduced. The estimated ejection fraction was in the range of 30% to 35%. - Ventricular septum: The contour showed diastolic flattening. - Mitral valve: A bioprosthesis was present. Valve area by continuity equation (using LVOT flow): 0.89 cm^2. - Left atrium: The atrium was severely dilated. - Right ventricle: The cavity size was mildly  dilated. Systolic function was mildly to moderately reduced. - Right atrium: The atrium was moderately dilated.     Radiology/Studies  Dg Chest 2 View  10/10/2015  CLINICAL DATA:  Pt having SOB - hx of mitral valve replacement, issues with systolic heart failure, CAD, AFIB, CHF, diabetes, htn, sleep apnea, kidney disease EXAM: CHEST  2 VIEW COMPARISON:  09/15/2015 FINDINGS: Changes from cardiac surgery are stable. There is moderate cardiomegaly, also stable. No mediastinal or hilar masses or convincing adenopathy. Lungs are mildly hyperexpanded. There are prominent interstitial markings most evident in the lung bases. No convincing pneumonia no overt pulmonary edema. No pleural effusion or pneumothorax. Skeletal structures are demineralized but grossly intact. IMPRESSION: 1. Stable appearance from the prior study. No acute cardiopulmonary disease. 2. Moderate cardiomegaly. Electronically Signed   By: Lajean Manes M.D.   On: 10/10/2015 08:28   Dg Chest 2 View  09/15/2015  CLINICAL DATA:  Shortness of breath, recent discharge from hospital. History of hypertension, diabetes, CHF, cardiac valve replacement, pulmonary hypertension. EXAM: CHEST  2 VIEW COMPARISON:  Chest radiograph September 10, 2015 FINDINGS: Cardiac silhouette is moderately enlarged unchanged. Mildly widened mediastinum is unchanged, status post median sternotomy for cardiac valve replacement. Mild pulmonary vascular congestion minimal interstitial prominence. Mildly flattened hemidiaphragms. No pleural effusion or focal consolidation. No pneumothorax. Soft tissue planes and included osseous structure nonsuspicious. IMPRESSION: Stable cardiomegaly. Stable fullness of the mediastinum suggest vascular engorgement, in a background of pulmonary vascular congestion. Slightly increasing interstitial prominence concerning for pulmonary edema, less likely atypical infection. Electronically Signed   By: Elon Alas M.D.   On: 09/15/2015  04:38    ASSESSMENT AND PLAN  54 yo male with PMH of MVR with porcine valve 2012, tricuspid valve repair, PAF, noncompliance with coumadin, CKD, OSA, uncontrolled DM, HTN, HLD, pulm HTN presented with afib with RVR and acute systolic HF  1. Acute systolic HF - admit to tele, treat with IV lasix wt is up 20 lbs.   - appears to be close to euvolemic on exam, weight down from 228 to 218, previous discharge weight was 208, will push for another day of IV lasix as long as his renal function will tolerate.  2. A fib with RVR, not sure if he was taking meds which may be part of the problem he was taking lasix.   - Anticoagulation CHADS score 4   - INR subtherapeutic, bridged with INR.  - will increase metoprolol to 150mg  BID. HR was low 100s overnight, not sure how much lower his HR will be.  3.Acute on chronic renal insufficiency: ACEI on hold  - Cr 2.72 on 10/28. Cr 4.01 on arrival --> 3.4 --> 2.75 --> 2.63. Cr improving with IV diruesis  4. DM insulin dependent Recent hgbA1c 7.8  5. HTN controlled  6. Tobacco use, he is down to 2 cigarettes a day. Down from 1  ppd  7. MVR with tissue valve, TV repair 2012  Signed, Meng, Hao PA-C Pager: [phone]  Patient seen, examined. Available data reviewed. Agree with findings, assessment, and plan as outlined by Almyra Deforest, PA-C. The patient was independently interviewed and examined. Lung exam is improved with scattered rhonchi today but much more clear breath sounds. Heart is irregularly irregular. The patient's abdomen is softer today. There is no peripheral edema. I's/O's are not accurately recorded. I would push IV diuretics for another day to try to get closer to his dry weight. His renal function continues to improve. Probably switch to oral diuretics tomorrow. I'm not sure that this patient is going to be able to tolerate an ACE/ARB. He has stage IV kidney disease and I think noncompliance will be a lifelong issue. I think he is at high risk of  acute on chronic renal failure. Will add imdur/hydralazine at low dose to his regimen. Otherwise continue current medical therapy as outlined above.  Sherren Mocha, M.D. 10/12/2015 9:22 AM

## 2015-10-13 DIAGNOSIS — I10 Essential (primary) hypertension: Secondary | ICD-10-CM

## 2015-10-13 LAB — GLUCOSE, CAPILLARY
Glucose-Capillary: 69 mg/dL (ref 65–99)
Glucose-Capillary: 100 mg/dL — ABNORMAL HIGH (ref 65–99)
Glucose-Capillary: 145 mg/dL — ABNORMAL HIGH (ref 65–99)
Glucose-Capillary: 75 mg/dL (ref 65–99)
Glucose-Capillary: 131 mg/dL — ABNORMAL HIGH (ref 65–99)

## 2015-10-13 LAB — PROTIME-INR
INR: 2.63 — ABNORMAL HIGH (ref 0.00–1.49)
Prothrombin Time: 27.7 seconds — ABNORMAL HIGH (ref 11.6–15.2)

## 2015-10-13 MED ORDER — WARFARIN SODIUM 2.5 MG PO TABS
2.5000 mg | ORAL_TABLET | Freq: Once | ORAL | Status: AC
  Administered 2015-10-13: 2.5 mg via ORAL
  Filled 2015-10-13: qty 1

## 2015-10-13 MED ORDER — ZOLPIDEM TARTRATE 5 MG PO TABS
5.0000 mg | ORAL_TABLET | Freq: Once | ORAL | Status: AC
  Administered 2015-10-13: 5 mg via ORAL
  Filled 2015-10-13: qty 1

## 2015-10-13 MED ORDER — DIGOXIN 0.25 MG/ML IJ SOLN
0.2500 mg | Freq: Every day | INTRAMUSCULAR | Status: DC
  Administered 2015-10-13 – 2015-10-15 (×3): 0.25 mg via INTRAVENOUS
  Filled 2015-10-13 (×3): qty 1

## 2015-10-13 NOTE — Progress Notes (Signed)
ANTICOAGULATION CONSULT NOTE - Follow up Pharmacy Consult for Coumadin   Indication: atrial fibrillation   No Known Allergies  Patient Measurements: Height: 5\' 3"  (160 cm) Weight: 215 lb 6.4 oz (97.705 kg) IBW/kg (Calculated) : 56.9  Heparin dosing weight: 80.2 kg  Vital Signs: Temp: 97.9 F (36.6 C) (11/26 0501) Temp Source: Axillary (11/26 0501) BP: 104/73 mmHg (11/26 0900) Pulse Rate: 116 (11/26 0900)  Labs:  Recent Labs  10/10/15 1811 10/11/15 0418 10/12/15 0219 10/13/15 0537  HGB 9.9* 9.7*  --   --   HCT 31.4* 30.7*  --   --   PLT 242 238  --   --   LABPROT  --  23.3* 26.6* 27.7*  INR  --  2.09* 2.49* 2.63*  HEPARINUNFRC <0.10* 0.24*  --   --   CREATININE  --  2.75* 2.63*  --     Estimated Creatinine Clearance: 32.9 mL/min (by C-G formula based on Cr of 2.63).  Assessment: 54 yo male presented to [facility] on 10/09/15 with SOB/follow-up for afib. Admitted for Acute on chronic systolic CHF, Acute on CKD, Afib with RVR with subtherapeutic INR.   PMH: meth use, MVR porcine in 2012, Tricuspid valve repair, AFib on warfarin with noncompliance, CKD, OSA, DM, Anemia, HTN, Pulm HTN, HLD  AC: on warfarin pta for MVR/afib.  INR today continues to be therapeutic at 2.6 (significant trend up over the past few days now slowing).    PTA dose 5 mg daily with pt stating not taking (noncompliance suspect duet to language barrier limiting patient's understanding of previous medical instructions)..  Renal: SCr 4.0>3.4>2.75>2.6 Heme: H&H 9.7/30.7, Plt 238   No bleeding/epistaxis noted.  Goal of Therapy:  INR 2-3 Monitor platelets by anticoagulation protocol: Yes   Plan:  Warfarin 2.5 mg x 1 Daily INR Monitor for s/sx of bleeding  Erin Hearing PharmD., BCPS Clinical Pharmacist Pager [phone] 10/13/2015 11:08 AM

## 2015-10-13 NOTE — Progress Notes (Addendum)
Patient ID: Michael [name], male   DOB: [birth year], 54 y.o.   MRN: [id number]    Patient Name: Michael [name] Date of Encounter: 10/13/2015     Principal Problem:   Acute on chronic systolic CHF (congestive heart failure) (HCC) Active Problems:   CKD (chronic kidney disease), stage IV (HCC)   SOB (shortness of breath)   Atrial fibrillation with rapid ventricular response ([place])   DM type 2 (diabetes mellitus, type 2) ([place])   Essential hypertension   Mitral valve disease   Systolic heart failure (HCC)   Tricuspid valve disease    SUBJECTIVE  Dyspnea almost back to baseline. Remains in atrial fib with a RVR. Peripheral edema much improved.  CURRENT MEDS . furosemide  80 mg Intravenous Q12H  . hydrALAZINE  10 mg Oral TID  . insulin aspart  0-20 Units Subcutaneous TID WC  . insulin aspart  0-5 Units Subcutaneous QHS  . insulin glargine  30 Units Subcutaneous QHS  . isosorbide mononitrate  30 mg Oral Daily  . metoprolol tartrate  150 mg Oral Q12H  . potassium chloride  20 mEq Oral TID  . sodium chloride  3 mL Intravenous Q12H  . Warfarin - Pharmacist Dosing Inpatient   Does not apply q1800    OBJECTIVE  Filed Vitals:   10/12/15 1400 10/12/15 1952 10/13/15 0501 10/13/15 0900  BP: 108/80 107/74 122/94 104/73  Pulse: 82 73 101 116  Temp: 97.4 F (36.3 C) 98.3 F (36.8 C) 97.9 F (36.6 C)   TempSrc: Oral Oral Axillary   Resp: 20 18 18    Height:      Weight:   215 lb 6.4 oz (97.705 kg)   SpO2: 97% 97% 97%     Intake/Output Summary (Last 24 hours) at 10/13/15 0958 Last data filed at 10/13/15 0700  Gross per 24 hour  Intake    600 ml  Output      0 ml  Net    600 ml   Filed Weights   10/11/15 0454 10/12/15 0517 10/13/15 0501  Weight: 220 lb (99.791 kg) 218 lb 8 oz (99.111 kg) 215 lb 6.4 oz (97.705 kg)    PHYSICAL EXAM  General: Pleasant, NAD. Neuro: Alert and oriented X 3. Moves all extremities spontaneously. Psych: Normal affect. HEENT:   Normal  Neck: Supple without bruits or JVD. Lungs:  Resp regular and unlabored, CTA. Heart: IRIR tachy, soft systolic murmur. Abdomen: Soft, non-tender, non-distended, BS + x 4.  Extremities: No clubbing, cyanosis or edema. DP/PT/Radials 2+ and equal bilaterally.  Accessory Clinical Findings  CBC  Recent Labs  10/10/15 1811 10/11/15 0418  WBC 8.6 8.2  HGB 9.9* 9.7*  HCT 31.4* 30.7*  MCV 79.1 78.9  PLT 242 [id number]   Basic Metabolic Panel  Recent Labs  10/11/15 0418 10/12/15 0219  NA 142 139  K 4.4 4.6  CL 109 106  CO2 24 27  GLUCOSE 109* 126*  BUN 66* 55*  CREATININE 2.75* 2.63*  CALCIUM 8.9 8.9   Liver Function Tests No results for input(s): AST, ALT, ALKPHOS, BILITOT, PROT, ALBUMIN in the last 72 hours. No results for input(s): LIPASE, AMYLASE in the last 72 hours. Cardiac Enzymes No results for input(s): CKTOTAL, CKMB, CKMBINDEX, TROPONINI in the last 72 hours. BNP Invalid input(s): POCBNP D-Dimer No results for input(s): DDIMER in the last 72 hours. Hemoglobin A1C No results for input(s): HGBA1C in the last 72 hours. Fasting Lipid Panel No results for input(s): CHOL, HDL, LDLCALC, TRIG, CHOLHDL, LDLDIRECT  in the last 72 hours. Thyroid Function Tests No results for input(s): TSH, T4TOTAL, T3FREE, THYROIDAB in the last 72 hours.  Invalid input(s): FREET3  TELE  Atrial fib with a RVR  Radiology/Studies  Dg Chest 2 View  10/10/2015  CLINICAL DATA:  Pt having SOB - hx of mitral valve replacement, issues with systolic heart failure, CAD, AFIB, CHF, diabetes, htn, sleep apnea, kidney disease EXAM: CHEST  2 VIEW COMPARISON:  09/15/2015 FINDINGS: Changes from cardiac surgery are stable. There is moderate cardiomegaly, also stable. No mediastinal or hilar masses or convincing adenopathy. Lungs are mildly hyperexpanded. There are prominent interstitial markings most evident in the lung bases. No convincing pneumonia no overt pulmonary edema. No pleural effusion or  pneumothorax. Skeletal structures are demineralized but grossly intact. IMPRESSION: 1. Stable appearance from the prior study. No acute cardiopulmonary disease. 2. Moderate cardiomegaly. Electronically Signed   By: Lajean Manes M.D.   On: 10/10/2015 08:28   Dg Chest 2 View  09/15/2015  CLINICAL DATA:  Shortness of breath, recent discharge from hospital. History of hypertension, diabetes, CHF, cardiac valve replacement, pulmonary hypertension. EXAM: CHEST  2 VIEW COMPARISON:  Chest radiograph September 10, 2015 FINDINGS: Cardiac silhouette is moderately enlarged unchanged. Mildly widened mediastinum is unchanged, status post median sternotomy for cardiac valve replacement. Mild pulmonary vascular congestion minimal interstitial prominence. Mildly flattened hemidiaphragms. No pleural effusion or focal consolidation. No pneumothorax. Soft tissue planes and included osseous structure nonsuspicious. IMPRESSION: Stable cardiomegaly. Stable fullness of the mediastinum suggest vascular engorgement, in a background of pulmonary vascular congestion. Slightly increasing interstitial prominence concerning for pulmonary edema, less likely atypical infection. Electronically Signed   By: Elon Alas M.D.   On: 09/15/2015 04:38    ASSESSMENT AND PLAN  1. Acute on chronic systolic heart failure - his volume overload has improved. Will continue lasix IV but reduce dose 2. Atrial fib with a RVR - his rate is still not well controlled. Will add IV digoxin. Continue metoprolol. 3. Chronic stage 4 renal failure - his kidney function had improved. Will recheck tomorrow.   Lanaya Bennis,M.D.  10/13/2015 9:58 AM

## 2015-10-14 DIAGNOSIS — N184 Chronic kidney disease, stage 4 (severe): Secondary | ICD-10-CM

## 2015-10-14 LAB — GLUCOSE, CAPILLARY
Glucose-Capillary: 112 mg/dL — ABNORMAL HIGH (ref 65–99)
Glucose-Capillary: 170 mg/dL — ABNORMAL HIGH (ref 65–99)
Glucose-Capillary: 94 mg/dL (ref 65–99)
Glucose-Capillary: 159 mg/dL — ABNORMAL HIGH (ref 65–99)

## 2015-10-14 LAB — CBC
HCT: 28 % — ABNORMAL LOW (ref 39.0–52.0)
HEMOGLOBIN: 8.8 g/dL — AB (ref 13.0–17.0)
MCH: 24.9 pg — AB (ref 26.0–34.0)
MCHC: 31.4 g/dL (ref 30.0–36.0)
MCV: 79.1 fL (ref 78.0–100.0)
PLATELETS: 228 10*3/uL (ref 150–400)
RBC: 3.54 MIL/uL — AB (ref 4.22–5.81)
RDW: 17.2 % — ABNORMAL HIGH (ref 11.5–15.5)
WBC: 7.5 10*3/uL (ref 4.0–10.5)

## 2015-10-14 LAB — BASIC METABOLIC PANEL
Anion gap: 9 (ref 5–15)
BUN: 46 mg/dL — AB (ref 6–20)
CHLORIDE: 102 mmol/L (ref 101–111)
CO2: 26 mmol/L (ref 22–32)
CREATININE: 2.65 mg/dL — AB (ref 0.61–1.24)
Calcium: 9 mg/dL (ref 8.9–10.3)
GFR, EST AFRICAN AMERICAN: 30 mL/min — AB (ref >60)
GFR, EST NON AFRICAN AMERICAN: 26 mL/min — AB (ref >60)
Glucose, Bld: 104 mg/dL — ABNORMAL HIGH (ref 65–99)
POTASSIUM: 5.2 mmol/L — AB (ref 3.5–5.1)
SODIUM: 137 mmol/L (ref 135–145)

## 2015-10-14 LAB — PROTIME-INR
INR: 2.69 — AB (ref 0.00–1.49)
PROTHROMBIN TIME: 28.2 s — AB (ref 11.6–15.2)

## 2015-10-14 MED ORDER — POTASSIUM CHLORIDE CRYS ER 20 MEQ PO TBCR
20.0000 meq | EXTENDED_RELEASE_TABLET | Freq: Two times a day (BID) | ORAL | Status: DC
  Administered 2015-10-15 – 2015-10-17 (×5): 20 meq via ORAL
  Filled 2015-10-14 (×5): qty 1

## 2015-10-14 MED ORDER — WARFARIN SODIUM 2.5 MG PO TABS
2.5000 mg | ORAL_TABLET | Freq: Once | ORAL | Status: AC
  Administered 2015-10-14: 2.5 mg via ORAL
  Filled 2015-10-14: qty 1

## 2015-10-14 NOTE — Progress Notes (Signed)
ANTICOAGULATION CONSULT NOTE - Follow up Pharmacy Consult for Coumadin   Indication: atrial fibrillation   No Known Allergies  Patient Measurements: Height: 5\' 3"  (160 cm) Weight: 212 lb 11.2 oz (96.48 kg) IBW/kg (Calculated) : 56.9  Heparin dosing weight: 80.2 kg  Vital Signs: Temp: 98.2 F (36.8 C) (11/27 0446) Temp Source: Oral (11/27 0446) BP: 98/63 mmHg (11/27 0446) Pulse Rate: 82 (11/27 0446)  Labs:  Recent Labs  10/12/15 0219 10/13/15 0537 10/14/15 0532  HGB  --   --  8.8*  HCT  --   --  28.0*  PLT  --   --  228  LABPROT 26.6* 27.7* 28.2*  INR 2.49* 2.63* 2.69*  CREATININE 2.63*  --  2.65*    Estimated Creatinine Clearance: 32.4 mL/min (by C-G formula based on Cr of 2.65).  Assessment: 54 yo male presented to [facility] on 10/09/15 with SOB/follow-up for afib. Admitted for Acute on chronic systolic CHF, Acute on CKD, Afib with RVR with subtherapeutic INR.   PMH: meth use, MVR porcine in 2012, Tricuspid valve repair, AFib on warfarin with noncompliance, CKD, OSA, DM, Anemia, HTN, Pulm HTN, HLD  AC: on warfarin pta for MVR/afib.  INR today continues to be therapeutic at 2.6 (trend up has now slowed).    PTA dose 5 mg daily with pt stating not taking (noncompliance suspect duet to language barrier limiting patient's understanding of previous medical instructions)..  Renal: SCr 2.6 Heme: H&H 8.8/28(down), Plt 228   No bleeding/epistaxis noted.  Goal of Therapy:  INR 2-3 Monitor platelets by anticoagulation protocol: Yes   Plan:  Warfarin 2.5 mg x 1 Consider MWF INR if stable in am Monitor for s/sx of bleeding  Erin Hearing PharmD., BCPS Clinical Pharmacist Pager [phone] 10/14/2015 10:34 AM

## 2015-10-14 NOTE — Progress Notes (Signed)
Patient ID: Tian Douds, male   DOB: 08-Jun-1961, 54 y.o.   MRN: [id number]    Patient Name: Michael [name] Date of Encounter: 10/14/2015     Principal Problem:   Acute on chronic systolic CHF (congestive heart failure) (HCC) Active Problems:   CKD (chronic kidney disease), stage IV (HCC)   SOB (shortness of breath)   Atrial fibrillation with rapid ventricular response ([place])   DM type 2 (diabetes mellitus, type 2) ([place])   Essential hypertension   Mitral valve disease   Systolic heart failure (HCC)   Tricuspid valve disease    SUBJECTIVE  Dyspnea almost back to baseline. Remains in atrial fib with a RVR. Peripheral edema much improved.  CURRENT MEDS . digoxin  0.25 mg Intravenous Daily  . furosemide  80 mg Intravenous Q12H  . hydrALAZINE  10 mg Oral TID  . insulin aspart  0-20 Units Subcutaneous TID WC  . insulin aspart  0-5 Units Subcutaneous QHS  . insulin glargine  30 Units Subcutaneous QHS  . isosorbide mononitrate  30 mg Oral Daily  . metoprolol tartrate  150 mg Oral Q12H  . potassium chloride  20 mEq Oral TID  . sodium chloride  3 mL Intravenous Q12H  . Warfarin - Pharmacist Dosing Inpatient   Does not apply q1800    OBJECTIVE  Filed Vitals:   10/13/15 0900 10/13/15 1409 10/13/15 2024 10/14/15 0446  BP: 104/73 89/61 116/61 98/63  Pulse: 116 116 54 82  Temp:  99.8 F (37.7 C) 98.3 F (36.8 C) 98.2 F (36.8 C)  TempSrc:  Oral Oral Oral  Resp:   18 18  Height:      Weight:    96.48 kg (212 lb 11.2 oz)  SpO2:  96% 96% 96%    Intake/Output Summary (Last 24 hours) at 10/14/15 1012 Last data filed at 10/13/15 1700  Gross per 24 hour  Intake    360 ml  Output      0 ml  Net    360 ml   Filed Weights   10/12/15 0517 10/13/15 0501 10/14/15 0446  Weight: 99.111 kg (218 lb 8 oz) 97.705 kg (215 lb 6.4 oz) 96.48 kg (212 lb 11.2 oz)    PHYSICAL EXAM  General: Pleasant, NAD. Neuro: Alert and oriented X 3. Moves all extremities  spontaneously. Psych: Normal affect. HEENT:  Normal  Neck: Supple without bruits or JVD. Lungs:  Resp regular and unlabored, CTA. Heart: IRIR tachy, soft systolic murmur. Abdomen: Soft, non-tender, non-distended, BS + x 4.  Extremities: chronic edema with discoloration plus one now   Accessory Clinical Findings  CBC  Recent Labs  10/14/15 0532  WBC 7.5  HGB 8.8*  HCT 28.0*  MCV 79.1  PLT [id number]   Basic Metabolic Panel  Recent Labs  10/12/15 0219 10/14/15 0532  NA 139 137  K 4.6 5.2*  CL 106 102  CO2 27 26  GLUCOSE 126* 104*  BUN 55* 46*  CREATININE 2.63* 2.65*  CALCIUM 8.9 9.0   Liver Function Tests No results for input(s): AST, ALT, ALKPHOS, BILITOT, PROT, ALBUMIN in the last 72 hours. No results for input(s): LIPASE, AMYLASE in the last 72 hours. Cardiac Enzymes No results for input(s): CKTOTAL, CKMB, CKMBINDEX, TROPONINI in the last 72 hours. BNP Invalid input(s): POCBNP D-Dimer No results for input(s): DDIMER in the last 72 hours. Hemoglobin A1C No results for input(s): HGBA1C in the last 72 hours. Fasting Lipid Panel No results for input(s): CHOL, HDL, LDLCALC, TRIG, CHOLHDL,  LDLDIRECT in the last 72 hours. Thyroid Function Tests No results for input(s): TSH, T4TOTAL, T3FREE, THYROIDAB in the last 72 hours.  Invalid input(s): FREET3  TELE  Atrial fib with a RVR  Radiology/Studies  Dg Chest 2 View  10/10/2015  CLINICAL DATA:  Pt having SOB - hx of mitral valve replacement, issues with systolic heart failure, CAD, AFIB, CHF, diabetes, htn, sleep apnea, kidney disease EXAM: CHEST  2 VIEW COMPARISON:  09/15/2015 FINDINGS: Changes from cardiac surgery are stable. There is moderate cardiomegaly, also stable. No mediastinal or hilar masses or convincing adenopathy. Lungs are mildly hyperexpanded. There are prominent interstitial markings most evident in the lung bases. No convincing pneumonia no overt pulmonary edema. No pleural effusion or pneumothorax.  Skeletal structures are demineralized but grossly intact. IMPRESSION: 1. Stable appearance from the prior study. No acute cardiopulmonary disease. 2. Moderate cardiomegaly. Electronically Signed   By: Lajean Manes M.D.   On: 10/10/2015 08:28   Dg Chest 2 View  09/15/2015  CLINICAL DATA:  Shortness of breath, recent discharge from hospital. History of hypertension, diabetes, CHF, cardiac valve replacement, pulmonary hypertension. EXAM: CHEST  2 VIEW COMPARISON:  Chest radiograph September 10, 2015 FINDINGS: Cardiac silhouette is moderately enlarged unchanged. Mildly widened mediastinum is unchanged, status post median sternotomy for cardiac valve replacement. Mild pulmonary vascular congestion minimal interstitial prominence. Mildly flattened hemidiaphragms. No pleural effusion or focal consolidation. No pneumothorax. Soft tissue planes and included osseous structure nonsuspicious. IMPRESSION: Stable cardiomegaly. Stable fullness of the mediastinum suggest vascular engorgement, in a background of pulmonary vascular congestion. Slightly increasing interstitial prominence concerning for pulmonary edema, less likely atypical infection. Electronically Signed   By: Elon Alas M.D.   On: 09/15/2015 04:38    ASSESSMENT AND PLAN  1. Acute on chronic systolic heart failure -  EF 30-35% with RV enlargement his volume overload has improved. Will continue lasix IV but reduce dose 2. Atrial fib with a RVR - his rate is still not well controlled. Will add IV digoxin. Continue metoprolol. CR elevated but stable around 2.65   3. Chronic stage 4 renal failure - his kidney function had improved. Cr 2.65   4. MVR.  Normal function by echo no diastolic murmur   Jenkins Rouge

## 2015-10-15 DIAGNOSIS — D649 Anemia, unspecified: Secondary | ICD-10-CM

## 2015-10-15 DIAGNOSIS — D509 Iron deficiency anemia, unspecified: Secondary | ICD-10-CM

## 2015-10-15 DIAGNOSIS — Z794 Long term (current) use of insulin: Secondary | ICD-10-CM

## 2015-10-15 DIAGNOSIS — E119 Type 2 diabetes mellitus without complications: Secondary | ICD-10-CM

## 2015-10-15 DIAGNOSIS — I5042 Chronic combined systolic (congestive) and diastolic (congestive) heart failure: Secondary | ICD-10-CM

## 2015-10-15 DIAGNOSIS — I059 Rheumatic mitral valve disease, unspecified: Secondary | ICD-10-CM

## 2015-10-15 DIAGNOSIS — I5043 Acute on chronic combined systolic (congestive) and diastolic (congestive) heart failure: Secondary | ICD-10-CM

## 2015-10-15 HISTORY — DX: Anemia, unspecified: D64.9

## 2015-10-15 LAB — PROTIME-INR
INR: 2.47 — AB (ref 0.00–1.49)
Prothrombin Time: 26.5 seconds — ABNORMAL HIGH (ref 11.6–15.2)

## 2015-10-15 LAB — BASIC METABOLIC PANEL
Anion gap: 9 (ref 5–15)
BUN: 38 mg/dL — AB (ref 6–20)
CHLORIDE: 98 mmol/L — AB (ref 101–111)
CO2: 29 mmol/L (ref 22–32)
Calcium: 9 mg/dL (ref 8.9–10.3)
Creatinine, Ser: 2.46 mg/dL — ABNORMAL HIGH (ref 0.61–1.24)
GFR calc Af Amer: 32 mL/min — ABNORMAL LOW (ref >60)
GFR, EST NON AFRICAN AMERICAN: 28 mL/min — AB (ref >60)
GLUCOSE: 80 mg/dL (ref 65–99)
POTASSIUM: 4.5 mmol/L (ref 3.5–5.1)
SODIUM: 136 mmol/L (ref 135–145)

## 2015-10-15 LAB — GLUCOSE, CAPILLARY
Glucose-Capillary: 127 mg/dL — ABNORMAL HIGH (ref 65–99)
Glucose-Capillary: 96 mg/dL (ref 65–99)
Glucose-Capillary: 91 mg/dL (ref 65–99)
Glucose-Capillary: 125 mg/dL — ABNORMAL HIGH (ref 65–99)

## 2015-10-15 MED ORDER — DIGOXIN 125 MCG PO TABS
0.1250 mg | ORAL_TABLET | Freq: Every day | ORAL | Status: DC
  Administered 2015-10-16 – 2015-10-17 (×2): 0.125 mg via ORAL
  Filled 2015-10-15 (×2): qty 1

## 2015-10-15 MED ORDER — FUROSEMIDE 80 MG PO TABS
80.0000 mg | ORAL_TABLET | Freq: Two times a day (BID) | ORAL | Status: DC
  Administered 2015-10-15 – 2015-10-17 (×4): 80 mg via ORAL
  Filled 2015-10-15 (×4): qty 1

## 2015-10-15 MED ORDER — WARFARIN SODIUM 2.5 MG PO TABS
2.5000 mg | ORAL_TABLET | Freq: Once | ORAL | Status: AC
  Administered 2015-10-15: 2.5 mg via ORAL
  Filled 2015-10-15: qty 1

## 2015-10-15 NOTE — Hospital Discharge Follow-Up (Signed)
Michael [name] , RN CM met with the patient on 10/10/15 with the assistance of [employer] via phone  and explained the services provided by the [facility] at [facility]. The patient has an appointment scheduled at the [facility] on 10/22/15 @ 1430 with Dr Jarold Song.  This CM spoke to Marvetta Gibbons, RN CM who noted that the patient may be discharged tomorrow.    Will continue to follow is hospital progress.

## 2015-10-15 NOTE — Progress Notes (Signed)
ANTICOAGULATION CONSULT NOTE - Follow up Pharmacy Consult for Coumadin   Indication: atrial fibrillation   No Known Allergies  Patient Measurements: Height: 5\' 3"  (160 cm) Weight: 206 lb 4.8 oz (93.577 kg) IBW/kg (Calculated) : 56.9  Heparin dosing weight: 80.2 kg  Vital Signs: Temp: 97.5 F (36.4 C) (11/28 0522) Temp Source: Oral (11/28 0522) BP: 121/76 mmHg (11/28 0941) Pulse Rate: 68 (11/28 0941)  Labs:  Recent Labs  10/13/15 0537 10/14/15 0532 10/15/15 0310  HGB  --  8.8*  --   HCT  --  28.0*  --   PLT  --  228  --   LABPROT 27.7* 28.2* 26.5*  INR 2.63* 2.69* 2.47*  CREATININE  --  2.65*  --     Estimated Creatinine Clearance: 31.9 mL/min (by C-G formula based on Cr of 2.65).  Assessment: 54 yo male presented to [facility] on 10/09/15 with SOB/follow-up for afib. Admitted for Acute on chronic systolic CHF, Acute on CKD, Afib with RVR with subtherapeutic INR.   PMH: meth use, MVR porcine in 2012, Tricuspid valve repair, AFib on warfarin with noncompliance, CKD, OSA, DM, Anemia, HTN, Pulm HTN, HLD.  Pharmacy consulted to dose warfarin. INR subtherapeutic due to noncompliance on admit. INR now remains therapeutic 2.47 - stable, epistaxis 11/22 appears to be resolved. Hgb 8.8, plt ok. No bleed documented.  PTA dose 5 mg daily with pt stating not taking (noncompliance suspect duet to language barrier limiting patient's understanding of previous medical instructions).  Goal of Therapy:  INR 2-3 Monitor platelets by anticoagulation protocol: Yes   Plan:  Warfarin 2.5 mg x 1 Daily INR - Consider MWF INR if stable in AM Monitor for s/sx of bleeding  Elicia Lamp, PharmD Clinical Pharmacist Pager [phone] 10/15/2015 10:19 AM

## 2015-10-15 NOTE — Progress Notes (Signed)
Patient Name: Michael [name] Date of Encounter: 10/15/2015  Primary Cardiologist: Dr. Harrington Challenger   Principal Problem:   Acute on chronic combined systolic and diastolic CHF (congestive heart failure) ([place]) Active Problems:   Atrial fibrillation with rapid ventricular response (HCC)   Chronic combined systolic and diastolic CHF, NYHA class 2 (HCC)   CKD (chronic kidney disease), stage IV ([place])   DM type 2 (diabetes mellitus, type 2) ([place])   Essential hypertension   Anemia - mild exacerbation   Mitral valve disease   Tricuspid valve disease    SUBJECTIVE  Denies any CP or SOB. Feeling well.   CURRENT MEDS . [START ON 10/16/2015] digoxin  0.125 mg Oral Daily  . furosemide  80 mg Oral BID  . hydrALAZINE  10 mg Oral TID  . insulin aspart  0-20 Units Subcutaneous TID WC  . insulin aspart  0-5 Units Subcutaneous QHS  . insulin glargine  30 Units Subcutaneous QHS  . isosorbide mononitrate  30 mg Oral Daily  . metoprolol tartrate  150 mg Oral Q12H  . potassium chloride  20 mEq Oral BID  . sodium chloride  3 mL Intravenous Q12H  . warfarin  2.5 mg Oral ONCE-1800  . Warfarin - Pharmacist Dosing Inpatient   Does not apply q1800    OBJECTIVE  Filed Vitals:   10/14/15 1823 10/14/15 2037 10/15/15 0522 10/15/15 0941  BP: 127/75 122/73 113/73 121/76  Pulse: 133 123 77 68  Temp: 98.6 F (37 C) 98.3 F (36.8 C) 97.5 F (36.4 C)   TempSrc: Oral Oral Oral   Resp:  20 18   Height:      Weight:   206 lb 4.8 oz (93.577 kg)   SpO2: 97% 98% 98% 99%    Intake/Output Summary (Last 24 hours) at 10/15/15 1417 Last data filed at 10/15/15 1000  Gross per 24 hour  Intake    240 ml  Output      0 ml  Net    240 ml   Filed Weights   10/13/15 0501 10/14/15 0446 10/15/15 0522  Weight: 215 lb 6.4 oz (97.705 kg) 212 lb 11.2 oz (96.48 kg) 206 lb 4.8 oz (93.577 kg)    PHYSICAL EXAM  General: Pleasant, NAD. Neuro: Alert and oriented X 3. Moves all extremities spontaneously. Psych:  Normal affect. HEENT:  Normal  Neck: Supple without bruits or JVD. Lungs:  Resp regular and unlabored, CTA. Heart: irregular. no s3, s4, or murmurs. Abdomen: Soft, non-tender, non-distended, BS + x 4.  Extremities: No clubbing, cyanosis. DP/PT/Radials 2+ and equal bilaterally. Trace edema in LLE, no edema on the right.   Accessory Clinical Findings  CBC  Recent Labs  10/14/15 0532  WBC 7.5  HGB 8.8*  HCT 28.0*  MCV 79.1  PLT [id number]   Basic Metabolic Panel  Recent Labs  10/14/15 0532 10/15/15 1227  NA 137 136  K 5.2* 4.5  CL 102 98*  CO2 26 29  GLUCOSE 104* 80  BUN 46* 38*  CREATININE 2.65* 2.46*  CALCIUM 9.0 9.0    TELE afib with HR 90s, occasional HR 100-110s    ECG  No new EKG  Echocardiogram 09/11/2015  LV EF: 30% -  35%  ------------------------------------------------------------------- Indications:   Atrial fibrillation - 427.31.  ------------------------------------------------------------------- History:  PMH:  Dyspnea. Risk factors: Mitral valve replacement. Chronic kidney disease. Hypertension. Diabetes mellitus.  ------------------------------------------------------------------- Study Conclusions  - Left ventricle: The cavity size was normal. Wall thickness was increased in a pattern  of mild LVH. Systolic function was moderately to severely reduced. The estimated ejection fraction was in the range of 30% to 35%. - Ventricular septum: The contour showed diastolic flattening. - Mitral valve: A bioprosthesis was present. Valve area by continuity equation (using LVOT flow): 0.89 cm^2. - Left atrium: The atrium was severely dilated. - Right ventricle: The cavity size was mildly dilated. Systolic function was mildly to moderately reduced. - Right atrium: The atrium was moderately dilated.    Radiology/Studies  Dg Chest 2 View  10/10/2015  CLINICAL DATA:  Pt having SOB - hx of mitral valve replacement, issues with  systolic heart failure, CAD, AFIB, CHF, diabetes, htn, sleep apnea, kidney disease EXAM: CHEST  2 VIEW COMPARISON:  09/15/2015 FINDINGS: Changes from cardiac surgery are stable. There is moderate cardiomegaly, also stable. No mediastinal or hilar masses or convincing adenopathy. Lungs are mildly hyperexpanded. There are prominent interstitial markings most evident in the lung bases. No convincing pneumonia no overt pulmonary edema. No pleural effusion or pneumothorax. Skeletal structures are demineralized but grossly intact. IMPRESSION: 1. Stable appearance from the prior study. No acute cardiopulmonary disease. 2. Moderate cardiomegaly. Electronically Signed   By: Lajean Manes M.D.   On: 10/10/2015 08:28    ASSESSMENT AND PLAN  54 yo male with PMH of MVR with porcine valve 2012, tricuspid valve repair, PAF, noncompliance with coumadin, CKD, OSA, uncontrolled DM, HTN, HLD, pulm HTN presented with afib with RVR and acute systolic HF  1. Acute on chronic combined HF - admission weight - up 20 lbs.  - appears to be euvolemic on exam, weight down from 228 to 206 today, previous discharge weight was 208.   - will transition to PO lasix, obtain BMET today, if Cr stable tomorrow, expect discharge. Noncompliance is a recurrent issue with this patient, will need sit down with [place] translator prior to discharge to go over discharge instructions, medications and coumadin followup.  2. A fib with RVR, - now rate controlled. not sure if he was taking meds which may be part of the problem he was taking lasix.  - Anticoagulation CHADS score 4  - INR subtherapeutic, bridged with INR. - metoprolol increased to 150mg  BID. Transition to PO digoxin tomorrow. Baseline HR 90s, which is likely as good as we can get.   3.Acute on chronic renal insufficiency: ACEI on hold - Cr 2.72 on 10/28. Cr 4.01 on arrival --> 3.4 --> 2.75 --> 2.63. Cr improving with IV  diruesis  4. DM insulin dependent Recent hgbA1c 7.8 - on Lantus plus meal coverage  With sliding scale correction  5. HTN controlled  6. Tobacco use, he is down to 2 cigarettes a day. Down from 1 ppd  7.MVR with porcine tissue valve, TV repair 2012  8. Acute on chronic anemia: hgb down from 10.3 to 8.8.  Hilbert Corrigan PA-C Pager: [phone]  I have seen, examined and evaluated the patient this AM along with Mr. Eulas Post, [place].  After reviewing all the available data and chart,  I agree with his findings, examination as well as impression recommendations.  Patient with known cardiomyopathy admitted for acute on chronic combined systolic and diastolic heart failure He was 20 pounds up from his baseline weight. Now diuresed well - weight down to 206 pounds from 228.  I agree with converting to by mouth Lasix. Renal function has gradually improved slightly with diuresis.  He doesn't acute on chronic anemia with reduced hemoglobin. No active signs of bleeding. We'll check anemia  panel with morning labs.  INR is now therapeutic. Rate controlled. Was put on digoxin over the weekend. Will need to be careful with digoxin in this patient with chronic renal insufficiency. Is on high-dose Lopressor for rate control and "heart failure ". Is also on hydralazine nitrate. ACE inhibitor/ARB not being used due to renal insufficiency.  On sliding scale insulin plus Lantus.  If renal function remained stable and he remains relatively is a dramatic heart failure standpoint overnight due to the discharge tomorrow. He complained about some left calf discomfort with ambulation. Will monitor but there is no active bruising noted. Be DVT based on his therapeutic INR.   Leonie Man, M.D., M.S. Interventional Cardiologist   Pager # [phone]

## 2015-10-15 NOTE — Progress Notes (Signed)
Nursing note Md in to see patient, made aware of patient leg pain on Left, will continue to monitor patient. Gleason Ardoin, Bettina Gavia RN

## 2015-10-15 NOTE — Progress Notes (Signed)
Utilization review completed.  

## 2015-10-15 NOTE — Care Management Important Message (Signed)
Important Message  Patient Details  Name: Michael [name] MRN: [id number] Date of Birth: [birth year]   Medicare Important Message Given:  Yes    Nathen May 10/15/2015, 3:14 PM

## 2015-10-15 NOTE — Care Management Note (Signed)
Case Management Note Marvetta Gibbons RN, BSN Unit 2W-Case Manager [phone]  Patient Details  Name: Michael [name] MRN: [id number] Date of Birth: 1961-04-08  Subjective/Objective:  Pt admitted with CHF                  Action/Plan: PTA pt lived at home- will need PCP arrangements as pt has not been very compliant with follow post last admission. have spoken with [facility]- liaison Carmela Hurt- who will come see pt about possible f/u with clinic. Pt will need to continue to have PT/INR checks arranged with coumadin clinic.  NCM to follow for further d/c needs  Expected Discharge Date:                  Expected Discharge Plan:  Home/Self Care  In-House Referral:     Discharge planning Services  CM Consult, Follow-[facility] appt scheduled, [facility]  Post Acute Care Choice:    Choice offered to:     DME Arranged:    DME Agency:     HH Arranged:    HH Agency:     Status of Service:  In process, will continue to follow  Medicare Important Message Given:    Date Medicare IM Given:    Medicare IM give by:    Date Additional Medicare IM Given:    Additional Medicare Important Message give by:     If discussed at [place] of Stay Meetings, dates discussed:  10/16/15  Additional Comments:  10/15/15- Marvetta Gibbons- f/u arrangements have been made at [facility]- [facility]- for 10/22/15 1430- appointment has been placed on AVS- pt needs instructions via interpretor services for best understanding of instructions. Will need appointment for INR checks at [facility] coumadin clinic. Per pt he can afford medications and daughter can assist with transportation to appointments.   Dawayne Patricia, RN 10/15/2015, 11:27 AM

## 2015-10-16 DIAGNOSIS — I079 Rheumatic tricuspid valve disease, unspecified: Secondary | ICD-10-CM

## 2015-10-16 LAB — CBC
HEMATOCRIT: 32.5 % — AB (ref 39.0–52.0)
Hemoglobin: 10.4 g/dL — ABNORMAL LOW (ref 13.0–17.0)
MCH: 25.4 pg — ABNORMAL LOW (ref 26.0–34.0)
MCHC: 32 g/dL (ref 30.0–36.0)
MCV: 79.5 fL (ref 78.0–100.0)
PLATELETS: 320 10*3/uL (ref 150–400)
RBC: 4.09 MIL/uL — ABNORMAL LOW (ref 4.22–5.81)
RDW: 16.1 % — AB (ref 11.5–15.5)
WBC: 10.6 10*3/uL — ABNORMAL HIGH (ref 4.0–10.5)

## 2015-10-16 LAB — GLUCOSE, CAPILLARY
Glucose-Capillary: 91 mg/dL (ref 65–99)
Glucose-Capillary: 115 mg/dL — ABNORMAL HIGH (ref 65–99)
Glucose-Capillary: 110 mg/dL — ABNORMAL HIGH (ref 65–99)
Glucose-Capillary: 109 mg/dL — ABNORMAL HIGH (ref 65–99)

## 2015-10-16 LAB — BASIC METABOLIC PANEL
Anion gap: 8 (ref 5–15)
BUN: 32 mg/dL — AB (ref 6–20)
CALCIUM: 8.9 mg/dL (ref 8.9–10.3)
CHLORIDE: 98 mmol/L — AB (ref 101–111)
CO2: 30 mmol/L (ref 22–32)
CREATININE: 2.2 mg/dL — AB (ref 0.61–1.24)
GFR calc Af Amer: 37 mL/min — ABNORMAL LOW (ref >60)
GFR calc non Af Amer: 32 mL/min — ABNORMAL LOW (ref >60)
Glucose, Bld: 104 mg/dL — ABNORMAL HIGH (ref 65–99)
Potassium: 4.2 mmol/L (ref 3.5–5.1)
SODIUM: 136 mmol/L (ref 135–145)

## 2015-10-16 LAB — VITAMIN B12: Vitamin B-12: 729 pg/mL (ref 180–914)

## 2015-10-16 LAB — FERRITIN: FERRITIN: 73 ng/mL (ref 24–336)

## 2015-10-16 LAB — PROTIME-INR
INR: 2.23 — ABNORMAL HIGH (ref 0.00–1.49)
Prothrombin Time: 24.5 seconds — ABNORMAL HIGH (ref 11.6–15.2)

## 2015-10-16 LAB — IRON AND TIBC
Iron: 21 ug/dL — ABNORMAL LOW (ref 45–182)
SATURATION RATIOS: 6 % — AB (ref 17.9–39.5)
TIBC: 374 ug/dL (ref 250–450)
UIBC: 353 ug/dL

## 2015-10-16 LAB — RETICULOCYTES
RBC.: 4.04 MIL/uL — AB (ref 4.22–5.81)
RETIC CT PCT: 2 % (ref 0.4–3.1)
Retic Count, Absolute: 80.8 10*3/uL (ref 19.0–186.0)

## 2015-10-16 LAB — FOLATE: FOLATE: 13.3 ng/mL (ref >5.9)

## 2015-10-16 MED ORDER — WARFARIN SODIUM 5 MG PO TABS
5.0000 mg | ORAL_TABLET | Freq: Once | ORAL | Status: AC
  Administered 2015-10-16: 5 mg via ORAL
  Filled 2015-10-16: qty 1

## 2015-10-16 NOTE — Progress Notes (Signed)
Subjective: He reports pain in the left lower and upper extremities.  Two days ago it started hurting real bad.  Before that just a little   Objective: Vital signs in last 24 hours: Temp:  [98.7 F (37.1 C)-99.6 F (37.6 C)] 99.6 F (37.6 C) (11/29 0401) Pulse Rate:  [68-91] 91 (11/29 0401) Resp:  [18] 18 (11/29 0401) BP: (104-123)/(63-76) 104/63 mmHg (11/29 0401) SpO2:  [95 %-100 %] 97 % (11/29 0401) Weight:  [202 lb 2.6 oz (91.7 kg)] 202 lb 2.6 oz (91.7 kg) (11/29 0401) Last BM Date: 10/15/15  Intake/Output from previous day: 11/28 0701 - 11/29 0700 In: 120 [P.O.:120] Out: 500 [Urine:500] Intake/Output this shift:    Medications Scheduled Meds: . digoxin  0.125 mg Oral Daily  . furosemide  80 mg Oral BID  . hydrALAZINE  10 mg Oral TID  . insulin aspart  0-20 Units Subcutaneous TID WC  . insulin aspart  0-5 Units Subcutaneous QHS  . insulin glargine  30 Units Subcutaneous QHS  . isosorbide mononitrate  30 mg Oral Daily  . metoprolol tartrate  150 mg Oral Q12H  . potassium chloride  20 mEq Oral BID  . sodium chloride  3 mL Intravenous Q12H  . Warfarin - Pharmacist Dosing Inpatient   Does not apply q1800   Continuous Infusions:  PRN Meds:.sodium chloride, acetaminophen, ondansetron (ZOFRAN) IV, sodium chloride, traMADol  PE: General appearance: alert, cooperative and no distress Lungs: clear to auscultation bilaterally Heart: irregularly irregular rhythm Extremities: No LEE on the right.  His right calve is soft and relaxed.  The left calf however is tight, 2+ nonpitting edema, tender to touc. No obvious difference in temp compare to the right.  The left upper ext: 1+ edema.  Pain with gripping. Pulses: 2+ and symmetric Skin: Warm and dry.   Neurologic: Grossly normal  Lab Results:   Recent Labs  10/14/15 0532  WBC 7.5  HGB 8.8*  HCT 28.0*  PLT 228   BMET  Recent Labs  10/14/15 0532 10/15/15 1227 10/16/15 0325  NA 137 136 136  K 5.2* 4.5 4.2    CL 102 98* 98*  CO2 26 29 30   GLUCOSE 104* 80 104*  BUN 46* 38* 32*  CREATININE 2.65* 2.46* 2.20*  CALCIUM 9.0 9.0 8.9   PT/INR  Recent Labs  10/14/15 0532 10/15/15 0310 10/16/15 0325  LABPROT 28.2* 26.5* 24.5*  INR 2.69* 2.47* 2.23*     Assessment/Plan   Principal Problem:   Acute on chronic combined systolic and diastolic CHF (congestive heart failure) (HCC) Active Problems:   CKD (chronic kidney disease), stage IV (HCC)   Atrial fibrillation with rapid ventricular response (HCC)   DM type 2 (diabetes mellitus, type 2) (HCC)   Essential hypertension   Mitral valve disease   Chronic combined systolic and diastolic CHF, NYHA class 2 (HCC)   Tricuspid valve disease   Anemia - mild exacerbation  54 yo male with PMH of MVR with porcine valve 2012, tricuspid valve repair, PAF, noncompliance with coumadin, CKD, OSA, uncontrolled DM, HTN, HLD, pulm HTN presented with afib with RVR and acute systolic HF  1. Acute on chronic combined HF - admission weight - up 20 lbs.  I/O not accurate.  appears to be euvolemic on exam, weight down from 228 to 202 today, previous discharge weight was 208.  On PO Lasix as of yesterday. Noncompliance is a recurrent issue with this patient, will need sit down with [place] translator prior to  discharge to go over discharge instructions, medications and coumadin followup.  2. A fib with RVR, -  now rate controlled. not sure if he was taking meds which may be part of the problem.  - Anticoagulation CHADS score 4  - INR subtherapeutic at admission.  Ok now - metoprolol increased to 150mg  BID.  PO digoxin starts today.  Baseline HR 90s, which is likely as good as we can get.   3.Acute on chronic renal insufficiency: ACEI on hold - SCr 4.01 on arrival --> 3.4 --> 2.75 --> 2.63-->2.20(today)   4. DM insulin dependent Recent hgbA1c 7.8 - on Lantus plus meal coverage With sliding scale correction  5. HTN  controlled  6. Tobacco use, he is down to 2 cigarettes a day. Down from 1 ppd  7.MVR with porcine tissue valve, TV repair 2012  8. Acute on chronic anemia: hgb down from 10.3 to 8.8(10/27).  9. Pain and edema in the left upper and lower extremities. He has 2+ edema in the left lower ext and 1+ in the left upper ext.  Excellent pulses.  He was subtherapeutic on his INR when he was admitted.  He was placed on heparin when he arrived.  DVT or embolism seem unlikely.  Compartment syndrome from coumadin?  Will check lower and upper ext venous dopplers given he has good pulses and ext are warm.  Perhaps the upper extremities was from an infiltrated IV?  Neither look like cellulitis but maybe they are.     LOS: 7 days    HAGER, BRYAN PA-C 10/16/2015 7:49 AM  I have seen, examined and evaluated the patient this AM along with Mr. Samara Snide.  After reviewing all the available data and chart,  I agree with his findings, examination as well as impression recommendations.  Appears to be euvolemic today. Volume status and weight stable on oral Lasix. Blood pressure stable. Overall from a heart failure stable. INR remains therapeutic.  Main issues today is this persistent left calf pain. The leg is somewhat more swollen and tender than yesterday. He has exertional pain that is worse than resting discomfort. The leg is somewhat warm to touch but no signs of cellulitis. With decent distal pulses I doubt compartment syndrome, but we cannot exclude a potential bleed from anticoagulation.  Agree with checking lower extremity artery and venous Dopplers just to ensure that there is adequate flow. He may be having some claudication.  The left hand seems more consistent with an infiltrated IV. Agree with checking Dopplers just to make sure there is no thrombus in the superficial veins. Would use warm compresses on the IV sites and ice on the hand.  I would like to monitor to make sure that the hand and leg is  improved prior to discharge. Consider discharge later on this afternoon, more likely tomorrow. If he stays tomorrow, would check another hemoglobin level.    Leonie Man, M.D., M.S. Interventional Cardiologist   Pager # [phone]

## 2015-10-16 NOTE — Progress Notes (Signed)
ANTICOAGULATION CONSULT NOTE - Follow up Pharmacy Consult for Coumadin   Indication: atrial fibrillation   No Known Allergies  Patient Measurements: Height: 5\' 3"  (160 cm) Weight: 202 lb 2.6 oz (91.7 kg) IBW/kg (Calculated) : 56.9  Heparin dosing weight: 80.2 kg  Vital Signs: Temp: 99.6 F (37.6 C) (11/29 0401) Temp Source: Oral (11/29 0401) BP: 104/63 mmHg (11/29 0401) Pulse Rate: 73 (11/29 0854)  Labs:  Recent Labs  10/14/15 0532 10/15/15 0310 10/15/15 1227 10/16/15 0325  HGB 8.8*  --   --   --   HCT 28.0*  --   --   --   PLT 228  --   --   --   LABPROT 28.2* 26.5*  --  24.5*  INR 2.69* 2.47*  --  2.23*  CREATININE 2.65*  --  2.46* 2.20*    Estimated Creatinine Clearance: 38 mL/min (by C-G formula based on Cr of 2.2).  Assessment: 54 yo male presented to [facility] on 10/09/15 with SOB/follow-up for afib. Admitted for Acute on chronic systolic CHF, Acute on CKD, Afib with RVR with subtherapeutic INR.   PMH: meth use, MVR porcine in 2012, Tricuspid valve repair, AFib on warfarin with noncompliance, CKD, OSA, DM, Anemia, HTN, Pulm HTN, HLD.  Pharmacy consulted to dose warfarin. INR remains therapeutic 2.23 but downtrending - may need to alternate 2.5/5mg  doses. Epistaxis 11/22 appears to be resolved. Hgb 8.8, plt ok. No bleed documented.  PTA dose 5 mg daily with pt stating not taking  Goal of Therapy:  INR 2-3 Monitor platelets by anticoagulation protocol: Yes   Plan:  Warfarin 5mg  x 1 Daily INR Monitor for s/sx of bleeding  Michael [name], PharmD Clinical Pharmacist Pager [phone] 10/16/2015 9:52 AM

## 2015-10-16 NOTE — Hospital Discharge Follow-Up (Signed)
Met with the patient to discuss his discharge plan. Marita Snellen interpreter # [phone] with [employer] assisted.  The patient confirmed that he prefers to have a [name] Interpreter despite the fact that he does speak [place]. He said that he can have a difficult time expressing himself.  He said that he understands that he has an appointment at the clinic on 10/22/15 @ 1430. He noted that his son , who lives with him, or his daughter will drive him to the appointment. He also stated that he has his prescriptions filled at [facility] and one if his children will take him to the pharmacy after he is discharged.   He reported no questions/concerns at this time.  Update provided to K. Dahlia Client, RN CM.

## 2015-10-16 NOTE — Progress Notes (Signed)
Swelling and pain noted in left hand. Pt has weak grip. Pain also noted in left leg MD aware. Will pass information to oncoming shift.  Raliegh Ip RN

## 2015-10-17 DIAGNOSIS — E1121 Type 2 diabetes mellitus with diabetic nephropathy: Secondary | ICD-10-CM

## 2015-10-17 DIAGNOSIS — L03116 Cellulitis of left lower limb: Secondary | ICD-10-CM

## 2015-10-17 LAB — BASIC METABOLIC PANEL
ANION GAP: 8 (ref 5–15)
BUN: 29 mg/dL — ABNORMAL HIGH (ref 6–20)
CHLORIDE: 96 mmol/L — AB (ref 101–111)
CO2: 30 mmol/L (ref 22–32)
Calcium: 8.9 mg/dL (ref 8.9–10.3)
Creatinine, Ser: 2.23 mg/dL — ABNORMAL HIGH (ref 0.61–1.24)
GFR, EST AFRICAN AMERICAN: 36 mL/min — AB (ref >60)
GFR, EST NON AFRICAN AMERICAN: 31 mL/min — AB (ref >60)
Glucose, Bld: 120 mg/dL — ABNORMAL HIGH (ref 65–99)
Potassium: 4.3 mmol/L (ref 3.5–5.1)
Sodium: 134 mmol/L — ABNORMAL LOW (ref 135–145)

## 2015-10-17 LAB — CBC
HEMATOCRIT: 33 % — AB (ref 39.0–52.0)
Hemoglobin: 10.2 g/dL — ABNORMAL LOW (ref 13.0–17.0)
MCH: 24.4 pg — ABNORMAL LOW (ref 26.0–34.0)
MCHC: 30.9 g/dL (ref 30.0–36.0)
MCV: 78.9 fL (ref 78.0–100.0)
Platelets: 297 10*3/uL (ref 150–400)
RBC: 4.18 MIL/uL — AB (ref 4.22–5.81)
RDW: 15.6 % — ABNORMAL HIGH (ref 11.5–15.5)
WBC: 11.2 10*3/uL — AB (ref 4.0–10.5)

## 2015-10-17 LAB — PROTIME-INR
INR: 2.17 — ABNORMAL HIGH (ref 0.00–1.49)
Prothrombin Time: 24 seconds — ABNORMAL HIGH (ref 11.6–15.2)

## 2015-10-17 LAB — GLUCOSE, CAPILLARY
Glucose-Capillary: 92 mg/dL (ref 65–99)
Glucose-Capillary: 105 mg/dL — ABNORMAL HIGH (ref 65–99)

## 2015-10-17 MED ORDER — METOPROLOL TARTRATE 75 MG PO TABS: 150.0000 mg | ORAL_TABLET | Freq: Two times a day (BID) | ORAL | Status: DC

## 2015-10-17 MED ORDER — FUROSEMIDE 80 MG PO TABS: 80.0000 mg | ORAL_TABLET | Freq: Two times a day (BID) | ORAL | Status: DC

## 2015-10-17 MED ORDER — INSULIN GLARGINE 100 UNIT/ML ~~LOC~~ SOLN: 30.0000 [IU] | Freq: Every day | SUBCUTANEOUS | Status: DC

## 2015-10-17 MED ORDER — CEPHALEXIN 500 MG PO CAPS
500.0000 mg | ORAL_CAPSULE | Freq: Two times a day (BID) | ORAL | Status: DC
  Administered 2015-10-17: 500 mg via ORAL
  Filled 2015-10-17: qty 1

## 2015-10-17 MED ORDER — HYDRALAZINE HCL 10 MG PO TABS: 10.0000 mg | ORAL_TABLET | Freq: Three times a day (TID) | ORAL | Status: DC

## 2015-10-17 MED ORDER — ISOSORBIDE MONONITRATE ER 30 MG PO TB24: 30.0000 mg | ORAL_TABLET | Freq: Every day | ORAL | Status: DC

## 2015-10-17 MED ORDER — DIGOXIN 125 MCG PO TABS: 0.1250 mg | ORAL_TABLET | Freq: Every day | ORAL | Status: DC

## 2015-10-17 MED ORDER — WARFARIN SODIUM 5 MG PO TABS: 5.0000 mg | ORAL_TABLET | Freq: Once | ORAL | Status: DC

## 2015-10-17 MED ORDER — CEPHALEXIN 500 MG PO CAPS: 500.0000 mg | ORAL_CAPSULE | Freq: Two times a day (BID) | ORAL | Status: DC

## 2015-10-17 NOTE — Progress Notes (Signed)
ANTICOAGULATION CONSULT NOTE - Follow up Pharmacy Consult for Coumadin   Indication: atrial fibrillation   No Known Allergies  Patient Measurements: Height: 5\' 3"  (160 cm) Weight: 192 lb 6.4 oz (87.272 kg) IBW/kg (Calculated) : 56.9  Heparin dosing weight: 80.2 kg  Vital Signs: Temp: 98.2 F (36.8 C) (11/30 0627) Temp Source: Oral (11/30 0627) BP: 110/57 mmHg (11/30 0627) Pulse Rate: 72 (11/30 0627)  Labs:  Recent Labs  10/15/15 0310 10/15/15 1227 10/16/15 0325 10/16/15 1315 10/17/15 0458 10/17/15 0829  HGB  --   --   --  10.4* 10.2*  --   HCT  --   --   --  32.5* 33.0*  --   PLT  --   --   --  320 297  --   LABPROT 26.5*  --  24.5*  --  24.0*  --   INR 2.47*  --  2.23*  --  2.17*  --   CREATININE  --  2.46* 2.20*  --   --  2.23*    Estimated Creatinine Clearance: 36.6 mL/min (by C-G formula based on Cr of 2.23).  Assessment: 54 yo male presented to [facility] on 10/09/15 with SOB/follow-up for afib. Admitted for Acute on chronic systolic CHF, Acute on CKD, Afib with RVR with subtherapeutic INR.   PMH: meth use, MVR porcine in 2012, Tricuspid valve repair, AFib on warfarin with noncompliance, CKD, OSA, DM, Anemia, HTN, Pulm HTN, HLD.  Pharmacy consulted to dose warfarin. INR remains therapeutic 2.17 but downtrending. Epistaxis 11/22 appears to be resolved. Hgb up to 10.2, plt stable wnl. No bleed documented.  PTA dose 5 mg daily with pt stating not taking  Goal of Therapy:  INR 2-3 Monitor platelets by anticoagulation protocol: Yes   Plan:  Warfarin 5mg  x 1 Daily INR Monitor for s/sx of bleeding  Elicia Lamp, PharmD Clinical Pharmacist Pager [phone] 10/17/2015 9:50 AM

## 2015-10-17 NOTE — Progress Notes (Signed)
Subjective: Left leg feels better but still hurts.  Objective: Vital signs in last 24 hours: Temp:  [98.2 F (36.8 C)-98.9 F (37.2 C)] 98.2 F (36.8 C) (11/30 0627) Pulse Rate:  [72-114] 72 (11/30 0627) Resp:  [18] 18 (11/29 1349) BP: (99-113)/(55-66) 110/57 mmHg (11/30 0627) SpO2:  [95 %-96 %] 96 % (11/30 0627) Weight:  [192 lb 6.4 oz (87.272 kg)] 192 lb 6.4 oz (87.272 kg) (11/30 0627) Last BM Date: 10/15/15  Intake/Output from previous day: 11/29 0701 - 11/30 0700 In: 780 [P.O.:780] Out: 2050 [Urine:2050] Intake/Output this shift: Total I/O In: 240 [P.O.:240] Out: -   Medications Scheduled Meds: . digoxin  0.125 mg Oral Daily  . furosemide  80 mg Oral BID  . hydrALAZINE  10 mg Oral TID  . insulin aspart  0-20 Units Subcutaneous TID WC  . insulin aspart  0-5 Units Subcutaneous QHS  . insulin glargine  30 Units Subcutaneous QHS  . isosorbide mononitrate  30 mg Oral Daily  . metoprolol tartrate  150 mg Oral Q12H  . potassium chloride  20 mEq Oral BID  . sodium chloride  3 mL Intravenous Q12H  . Warfarin - Pharmacist Dosing Inpatient   Does not apply q1800   Continuous Infusions:  PRN Meds:.sodium chloride, acetaminophen, ondansetron (ZOFRAN) IV, sodium chloride, traMADol  PE: General appearance: alert, cooperative and no distress Lungs: clear to auscultation bilaterally Heart: irregularly irregular rhythm Extremities: 1+ left LEE Pulses: 2+ and symmetric Skin: Warm and dry.  Erythema in the medial foot and ankle Neurologic: Grossly normal  Lab Results:   Recent Labs  10/16/15 1315 10/17/15 0458  WBC 10.6* 11.2*  HGB 10.4* 10.2*  HCT 32.5* 33.0*  PLT 320 297   BMET  Recent Labs  10/15/15 1227 10/16/15 0325  NA 136 136  K 4.5 4.2  CL 98* 98*  CO2 29 30  GLUCOSE 80 104*  BUN 38* 32*  CREATININE 2.46* 2.20*  CALCIUM 9.0 8.9   PT/INR  Recent Labs  10/15/15 0310 10/16/15 0325 10/17/15 0458  LABPROT 26.5* 24.5* 24.0*  INR 2.47* 2.23*  2.17*    Assessment/Plan  Principal Problem:   Acute on chronic combined systolic and diastolic CHF (congestive heart failure) (HCC) Active Problems:   CKD (chronic kidney disease), stage IV (HCC)   Atrial fibrillation with rapid ventricular response (HCC)   DM type 2 (diabetes mellitus, type 2) (HCC)   Essential hypertension   Mitral valve disease   Chronic combined systolic and diastolic CHF, NYHA class 2 (HCC)   Tricuspid valve disease   Anemia - mild exacerbation   1. Acute on chronic combined HF - admission weight - up 20 lbs.  I/O not accurate. appears to be euvolemic on exam, weight down from 228 to 202 yesterday, 192 today? previous discharge weight was 208.  On PO Lasix for two days now. Noncompliance is a recurrent issue with this patient, will need sit down with [place] translator prior to discharge to go over discharge instructions, medications and coumadin followup.    2. A fib with RVR, -  - now rate controlled. not sure if he was taking meds which may be part of the problem.  - Anticoagulation CHADS score 4  - INR subtherapeutic at admission. [place] now.  Coumadin check Friday. - metoprolol increased to 150mg  BID. PO digoxin starts today. Baseline HR 90s, which is likely as good as we can get.   3.Acute on chronic renal insufficiency: ACEI on hold - SCr  4.01 on arrival --> 3.4 --> 2.75 --> 2.63-->2.20(yesterday).  BMET ordered for today.  4. DM insulin dependent Recent hgbA1c 7.8 - on Lantus plus meal coverage With sliding scale correction  5. HTN controlled  6. Tobacco use, he is down to 2 cigarettes a day. Down from 1 ppd  7.MVR with porcine tissue valve, TV repair 2012  8. Acute on chronic anemia: hgb down from 10.3 to 8.8(10/27).  9. Cellulitis left lower extremity Pain and edema in the left lower extremity.  Today this looks red on the medial foot and ankle.  WBCs increasing.  Will add Keflex 500mg  BID for 7  days.   He has 1+ edema in the left lower ext today and the calf is not as tense as yesterday. Excellent pulses. He was subtherapeutic on his INR when he was admitted. He was placed on heparin when he arrived. DVT or embolism seem unlikely. I think we can cancel the dopplers. If no improvement at followup, check dopplers. 10 Left upper ext edema-infiltrated IV  Warm compress and elevation  DC home today.     LOS: 8 days    HAGER, BRYAN PA-C 10/17/2015 7:45 AM  I have seen, examined and evaluated the patient this AM along with Mr. Samara Snide. After reviewing all the available data and chart, I agree with his findings, examination as well as impression recommendations.  Overall remains euvolemic. The leg does appear to be more consistent today with possible cellulitis. I agree with the short course of antibiotics. I think he may actually benefit from compression stockings as an outpatient. I'm not 100% sure what his symptoms truly or, it sounds cramping. He also may potentially having claudication symptoms. We may need to address possible claudication with lotion the arterial Dopplers as an outpatient. It is probably stable for discharge today recommendations for his hand swelling and legs were given. He is on a standing dose of oral Lasix.  Remains rate controlled atrial fibrillation anticoagulant with warfarin. Had scheduled outpatient warfarin INR check.  He will definitely need close hospital follow-up with TOC follow-up   Purnell Daigle, Leonie Green, M.D., M.S. Interventional Cardiologist   Pager # [phone]

## 2015-10-17 NOTE — Progress Notes (Signed)
Reviewed discharge instructions  With patient and son with the use of the interpreter telephone line.  Emphasis on taking his lasix, daily weights , and attending coumadin clinic.   Voiced understanding of instructions.  Mervyn Skeeters, RN

## 2015-10-17 NOTE — Discharge Summary (Signed)
Physician Discharge Summary    Cardiologist:  Harrington Challenger  Patient ID: Michael [name] MRN: [id number] DOB/AGE: 04-15-1961 54 y.o.  Admit date: 10/09/2015 Discharge date: 10/17/2015  Admission Diagnoses:   Acute on chronic combined systolic and diastolic CHF (congestive heart failure) ([place])  Discharge Diagnoses:  Principal Problem:   Acute on chronic combined systolic and diastolic CHF (congestive heart failure) (HCC) Active Problems:   CKD (chronic kidney disease), stage IV (HCC)   Atrial fibrillation with rapid ventricular response ([place])   DM type 2 (diabetes mellitus, type 2) ([place])   Essential hypertension   Mitral valve disease   Chronic combined systolic and diastolic CHF, NYHA class 2 (HCC)   Tricuspid valve disease   Anemia - mild exacerbation   Discharged Condition: stable  Hospital Course:   Michael [name] is a 54 y.o. male who presented for hospitalization follow up for a fib with RVR. CHADSVASC is 4.   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  Echo 09/11/15  revealed EF of 30-35%, mild LVH, MV area 0.89cm^2. LA severely dilated. RV mildly dilated with mild to mod reduced sys function, RA moderately dilated. Coreg changed to metoprolol as inpt. For improved rate control. He was discharged with INR 1.45 on the 28th of OCT. He was seen in ER on the 29th for SOB, but his BNP was better. Pt from [place] and speaks some English but can be difficult to understand.  He is unsure about his meds but has been taking his lasix. He has not had INR check since discharge.   In the office on 11/22 his wt was up at least 20 lbs, depending on wt from the 28th or  29th. He does have SOB, at night if he lies down his SOB increases most of the time. He also felt like he had a cold. He was admitted for IV diuresis and rate control. He was started on IV heparin due to subtherapeutic INR. Blood pressure was controlled.  Net fluids are not accurate but weight decreased from 223 to 202.  The last recorded weight was 192 which I do not think is accurate.  Lasix was changed to 80mg  PO twice daily and he remained stable.  SCr was up to 4.  ACE-I was stopped and hydralazine added.  SCr improved to 2.20.  CBGs stable.  Heart rate improved after being restarted on metoprolol which was titrated to 150mg  twice daily.  LEE improved however, he developed worsening edema in the left lower extremity only about three days before discharge.  It was associated with pain with ambulated and tenderness.  DVT and arterial thrombus were not likely given he was therapeutic on coumadin since 11/23.  The morning of discharge the edema in the left LE had improved but he had erythema in the shin and medial foot.  WBCs were trending up to 11.2.  He was started on Keflex 500mg  BID for seven days.  INR check on Friday scheduled.  He also had 2+ edema in the left upper extremity felt to be related to infiltrated IV. TOC follow up arranged.  The patient was seen by Dr. Ellyn Hack who felt he was stable for DC home.  If the patient continues to have left LE pain with ambulation consider checking LE arterial dopplers.  Consults: None  Significant Diagnostic Studies:    Treatments: See above  Discharge Exam: Blood pressure 99/70, pulse 82, temperature 98.2 F (36.8 C), temperature source Oral, resp. rate 18, height 5\' 3"  (1.6 m), weight 192 lb 6.4 oz (87.272 kg), SpO2 96 %.   Disposition: 01-Home or Self Care      Discharge Instructions    Diet - low sodium heart healthy    Complete by:  As directed      Discharge instructions    Complete by:  As directed   Monitor your weight every  morning.  If you gain 3 pounds in 24 hours, or 5 pounds in a week, call the office for instructions at [phone]     Increase activity slowly    Complete by:  As directed             Medication List    STOP taking these medications        lisinopril 10 MG tablet  Commonly known as:  PRINIVIL,ZESTRIL      TAKE these medications        acetaminophen 325 MG tablet  Commonly known as:  TYLENOL  Take 650 mg by mouth every 6 (six) hours as needed for headache (pain).     cephALEXin 500 MG capsule  Commonly known as:  KEFLEX  Take 1 capsule (500 mg total) by mouth every 12 (twelve) hours.     digoxin 0.125 MG tablet  Commonly known as:  LANOXIN  Take 1 tablet (0.125 mg total) by mouth daily.     furosemide 80 MG tablet  Commonly known as:  LASIX  Take 1 tablet (80 mg total) by mouth 2 (two) times daily.     hydrALAZINE 10 MG tablet  Commonly known as:  APRESOLINE  Take 1 tablet (10 mg total) by mouth 3 (three) times daily.     insulin glargine 100 UNIT/ML injection  Commonly known as:  LANTUS  Inject 0.3 mLs (30 Units total) into the skin at bedtime.     isosorbide mononitrate 30 MG 24 hr tablet  Commonly known as:  IMDUR  Take 1 tablet (30 mg total) by mouth daily.     Metoprolol Tartrate 75 MG Tabs  Take 150 mg by mouth every 12 (twelve) hours.     potassium chloride SA 20 MEQ tablet  Commonly known as:  K-DUR,KLOR-CON  Take 20 mEq by mouth 2 (two) times daily.     warfarin 5 MG tablet  Commonly known as:  COUMADIN  Take 1 tablet (5 mg total) by mouth daily.       Follow-up Information    Follow up with [place] On 10/22/2015.   Why:  Transitional Care Clinic appointment on 10/22/15 at 2:30 pm with Dr. Jarold Song.   Contact information:   [address] [place]  [phone]      Follow up with Richardson Dopp, PA-C On 10/24/2015.   Specialties:  Physician Assistant, Radiology, Interventional  Cardiology   Why:  12:10 PM   Contact information:   [id number] [place] [address] [phone]       Follow up with Coumadin Check On 10/19/2015.   Why:  10:30 AM   Contact information:   [address] [place] [address] [phone]      Greater than 30 minutes was spent completing the patient's discharge.   SignedTarri Fuller, [place] 10/17/2015, 11:59 AM  Patient seen and examined on day of discharge. Overall improved. Plans for discharge discussed with Mr. Samara Snide. I agree with his summary.  He will need full instructions on with [name] interpreter to ensure that he has any questions answered as far as medications.   Leonie Man, M.D., M.S. Interventional Cardiologist   Pager # [phone]

## 2015-10-18 ENCOUNTER — Telehealth: Payer: Self-pay

## 2015-10-18 ENCOUNTER — Telehealth: Payer: Self-pay | Admitting: Cardiology

## 2015-10-18 NOTE — Telephone Encounter (Signed)
New Message:  Lisbeth Renshaw called in stating that a prescription for Lantus was called in for the pt and she says that medication is not covered under his insurance, but Levamir and Tyler Aas is. She does not need a response , she just wanted to inform the physician  Thanks

## 2015-10-18 NOTE — Telephone Encounter (Signed)
i did not discharge the pt, please send to Tarri Fuller, PA

## 2015-10-18 NOTE — Telephone Encounter (Signed)
Ok.  The patient was already on Lantus.  He just had a dose change.  Musette Kisamore, PAC

## 2015-10-18 NOTE — Telephone Encounter (Signed)
Transitional Care Clinic Post-discharge Follow-Up Phone Call:  Date of Discharge: 10/17/15 Principal Discharge Diagnosis(es): Acute on chronic combined systolic and diastolic CHF  Call Completed: Yes  Please check all that apply:  X  Patient is knowledgeable of his/her condition(s) and/or treatment. X  Patient is caring for self at home.  ? Patient is receiving assist at home from family and/or caregiver. Family and/or caregiver is knowledgeable of patient's condition(s) and/or treatment. ? Patient is receiving home health services. If so, name of agency.     Medication Reconciliation:  X  Medication list reviewed with patient. X  Patient obtained all discharge medications-Yes.  Medication list reviewed with patient. Patient indicated he picked up all discharge medications from [place] on [facility]. Discussed importance of medication compliance and informed patient to bring all medications to his initial [facility] [place] [facility] appointment on 10/22/15 at 1430 with Dr. Jarold Song. Patient verbalized understanding.   Activities of Daily Living:  X  Independent ? Needs assist  ? Total Care    Community resources in place for patient:  X None  ? Home Health/Home DME ? Assisted Living ? Support Group          Patient Education: Inquired about patient's health status. Patient indicated he was "fine" and denied shortness of breath or difficulty breathing. Patient reminded of appointment with Coumadin Clinic on 10/19/15 at 1030 and discussed importance of not missing appointment.  Provided clinic phone number and address. In addition, patient reminded of initial [place] [place] [facility] appointment on 10/22/15 at 1430 with Dr. Jarold Song. Provided clinic phone number and address.   Discussed importance of eating a low sodium heart healthy diet and inquired if patient had a scale because his Cardiologist wants him to weigh himself daily. Patient indicated he did not have a scale but  planned to buy one as soon as possible.  Discussed importance of getting a scale because he will need to weigh himself each morning and call Cardiology clinic [phone]) if he gains 3 lbs in 24 hours or 5 lbs in a week. Also informed patient to keep a weight log once he obtains a scale. Patient verbalized understanding. [place] interpreter-Brian [phone]) with [employer] used for language interpretation.  No additional needs/concerns identified.

## 2015-10-19 ENCOUNTER — Ambulatory Visit (INDEPENDENT_AMBULATORY_CARE_PROVIDER_SITE_OTHER): Payer: Medicare Other | Admitting: *Deleted

## 2015-10-19 DIAGNOSIS — I059 Rheumatic mitral valve disease, unspecified: Secondary | ICD-10-CM

## 2015-10-19 DIAGNOSIS — I4891 Unspecified atrial fibrillation: Secondary | ICD-10-CM

## 2015-10-19 DIAGNOSIS — Z5181 Encounter for therapeutic drug level monitoring: Secondary | ICD-10-CM

## 2015-10-19 DIAGNOSIS — I079 Rheumatic tricuspid valve disease, unspecified: Secondary | ICD-10-CM | POA: Diagnosis not present

## 2015-10-19 LAB — POCT INR: INR: 2

## 2015-10-19 NOTE — Patient Instructions (Signed)

## 2015-10-22 ENCOUNTER — Ambulatory Visit: Payer: Medicare Other | Attending: Family Medicine | Admitting: Family Medicine

## 2015-10-22 ENCOUNTER — Encounter: Payer: Self-pay | Admitting: Family Medicine

## 2015-10-22 VITALS — BP 97/57 | HR 86 | Temp 97.9°F | Resp 16 | Ht 64.0 in | Wt 203.0 lb

## 2015-10-22 DIAGNOSIS — M79672 Pain in left foot: Secondary | ICD-10-CM | POA: Insufficient documentation

## 2015-10-22 DIAGNOSIS — L03115 Cellulitis of right lower limb: Secondary | ICD-10-CM

## 2015-10-22 DIAGNOSIS — Z952 Presence of prosthetic heart valve: Secondary | ICD-10-CM | POA: Diagnosis not present

## 2015-10-22 DIAGNOSIS — N184 Chronic kidney disease, stage 4 (severe): Secondary | ICD-10-CM

## 2015-10-22 DIAGNOSIS — Z7901 Long term (current) use of anticoagulants: Secondary | ICD-10-CM | POA: Diagnosis not present

## 2015-10-22 DIAGNOSIS — I5021 Acute systolic (congestive) heart failure: Secondary | ICD-10-CM | POA: Diagnosis not present

## 2015-10-22 DIAGNOSIS — I272 Other secondary pulmonary hypertension: Secondary | ICD-10-CM | POA: Insufficient documentation

## 2015-10-22 DIAGNOSIS — M25571 Pain in right ankle and joints of right foot: Secondary | ICD-10-CM | POA: Diagnosis not present

## 2015-10-22 DIAGNOSIS — E1129 Type 2 diabetes mellitus with other diabetic kidney complication: Secondary | ICD-10-CM | POA: Diagnosis not present

## 2015-10-22 DIAGNOSIS — I129 Hypertensive chronic kidney disease with stage 1 through stage 4 chronic kidney disease, or unspecified chronic kidney disease: Secondary | ICD-10-CM | POA: Diagnosis not present

## 2015-10-22 DIAGNOSIS — E669 Obesity, unspecified: Secondary | ICD-10-CM | POA: Diagnosis not present

## 2015-10-22 DIAGNOSIS — G4733 Obstructive sleep apnea (adult) (pediatric): Secondary | ICD-10-CM | POA: Diagnosis not present

## 2015-10-22 DIAGNOSIS — I2510 Atherosclerotic heart disease of native coronary artery without angina pectoris: Secondary | ICD-10-CM | POA: Diagnosis not present

## 2015-10-22 DIAGNOSIS — I4891 Unspecified atrial fibrillation: Secondary | ICD-10-CM

## 2015-10-22 DIAGNOSIS — E785 Hyperlipidemia, unspecified: Secondary | ICD-10-CM | POA: Diagnosis not present

## 2015-10-22 DIAGNOSIS — Z79899 Other long term (current) drug therapy: Secondary | ICD-10-CM | POA: Diagnosis not present

## 2015-10-22 DIAGNOSIS — Z9114 Patient's other noncompliance with medication regimen: Secondary | ICD-10-CM | POA: Diagnosis not present

## 2015-10-22 DIAGNOSIS — I10 Essential (primary) hypertension: Secondary | ICD-10-CM

## 2015-10-22 DIAGNOSIS — Z794 Long term (current) use of insulin: Secondary | ICD-10-CM | POA: Diagnosis not present

## 2015-10-22 DIAGNOSIS — E1121 Type 2 diabetes mellitus with diabetic nephropathy: Secondary | ICD-10-CM | POA: Diagnosis not present

## 2015-10-22 DIAGNOSIS — I5042 Chronic combined systolic (congestive) and diastolic (congestive) heart failure: Secondary | ICD-10-CM | POA: Diagnosis not present

## 2015-10-22 DIAGNOSIS — I480 Paroxysmal atrial fibrillation: Secondary | ICD-10-CM | POA: Insufficient documentation

## 2015-10-22 DIAGNOSIS — F17200 Nicotine dependence, unspecified, uncomplicated: Secondary | ICD-10-CM | POA: Diagnosis not present

## 2015-10-22 DIAGNOSIS — M25579 Pain in unspecified ankle and joints of unspecified foot: Secondary | ICD-10-CM | POA: Diagnosis not present

## 2015-10-22 LAB — COMPREHENSIVE METABOLIC PANEL
ALK PHOS: 122 U/L — AB (ref 40–115)
ALT: 12 U/L (ref 9–46)
AST: 17 U/L (ref 10–35)
Albumin: 3.4 g/dL — ABNORMAL LOW (ref 3.6–5.1)
BILIRUBIN TOTAL: 1 mg/dL (ref 0.2–1.2)
BUN: 72 mg/dL — AB (ref 7–25)
CO2: 27 mmol/L (ref 20–31)
CREATININE: 3.68 mg/dL — AB (ref 0.70–1.33)
Calcium: 8.7 mg/dL (ref 8.6–10.3)
Chloride: 104 mmol/L (ref 98–110)
GLUCOSE: 108 mg/dL — AB (ref 65–99)
Potassium: 5.3 mmol/L (ref 3.5–5.3)
SODIUM: 137 mmol/L (ref 135–146)
Total Protein: 7.1 g/dL (ref 6.1–8.1)

## 2015-10-22 LAB — URIC ACID: URIC ACID, SERUM: 14.1 mg/dL — AB (ref 4.0–7.8)

## 2015-10-22 LAB — GLUCOSE, POCT (MANUAL RESULT ENTRY): POC Glucose: 116 mg/dl — AB (ref 70–99)

## 2015-10-22 LAB — POCT INR: INR: 1.7

## 2015-10-22 MED ORDER — GLUCOSE BLOOD VI STRP: ORAL_STRIP | Status: DC

## 2015-10-22 MED ORDER — CEPHALEXIN 500 MG PO CAPS: 500.0000 mg | ORAL_CAPSULE | Freq: Two times a day (BID) | ORAL | Status: DC

## 2015-10-22 MED ORDER — ACCU-CHEK AVIVA DEVI: Status: DC

## 2015-10-22 MED ORDER — INSULIN GLARGINE 100 UNIT/ML ~~LOC~~ SOLN: 20.0000 [IU] | Freq: Every day | SUBCUTANEOUS | Status: DC

## 2015-10-22 MED ORDER — ACCU-CHEK SOFTCLIX LANCET DEV MISC: Status: DC

## 2015-10-22 NOTE — Progress Notes (Signed)
Pt's here for TCC for CHF. Patient reports having pain in right foot x2wks. Patent rate at 5/10. Pain in legs. Couldn't describe pain. Just a lot of swelling and redness.  Patient states that he doesn't feel lightheaded.  Patient declines interpreter.

## 2015-10-22 NOTE — Patient Instructions (Signed)

## 2015-10-23 ENCOUNTER — Telehealth: Payer: Self-pay | Admitting: *Deleted

## 2015-10-23 ENCOUNTER — Other Ambulatory Visit: Payer: Self-pay | Admitting: Family Medicine

## 2015-10-23 DIAGNOSIS — M10071 Idiopathic gout, right ankle and foot: Secondary | ICD-10-CM

## 2015-10-23 DIAGNOSIS — M109 Gout, unspecified: Secondary | ICD-10-CM | POA: Insufficient documentation

## 2015-10-23 HISTORY — DX: Gout, unspecified: M10.9

## 2015-10-23 LAB — MICROALBUMIN / CREATININE URINE RATIO
Creatinine, Urine: 39 mg/dL (ref 20–370)
MICROALB/CREAT RATIO: 67 ug/mg{creat} — AB (ref <30)
Microalb, Ur: 2.6 mg/dL

## 2015-10-23 LAB — CBC WITH DIFFERENTIAL/PLATELET
BASOS PCT: 1 % (ref 0–1)
Basophils Absolute: 0.1 10*3/uL (ref 0.0–0.1)
Eosinophils Absolute: 0.4 10*3/uL (ref 0.0–0.7)
Eosinophils Relative: 3 % (ref 0–5)
HCT: 28.5 % — ABNORMAL LOW (ref 39.0–52.0)
HEMOGLOBIN: 9.3 g/dL — AB (ref 13.0–17.0)
Lymphocytes Relative: 12 % (ref 12–46)
Lymphs Abs: 1.4 10*3/uL (ref 0.7–4.0)
MCH: 24.7 pg — AB (ref 26.0–34.0)
MCHC: 32.6 g/dL (ref 30.0–36.0)
MCV: 75.8 fL — ABNORMAL LOW (ref 78.0–100.0)
MONOS PCT: 11 % (ref 3–12)
MPV: 8.6 fL (ref 8.6–12.4)
Monocytes Absolute: 1.3 10*3/uL — ABNORMAL HIGH (ref 0.1–1.0)
NEUTROS ABS: 8.5 10*3/uL — AB (ref 1.7–7.7)
NEUTROS PCT: 73 % (ref 43–77)
Platelets: 462 10*3/uL — ABNORMAL HIGH (ref 150–400)
RBC: 3.76 MIL/uL — AB (ref 4.22–5.81)
RDW: 16.8 % — ABNORMAL HIGH (ref 11.5–15.5)
WBC: 11.7 10*3/uL — ABNORMAL HIGH (ref 4.0–10.5)

## 2015-10-23 LAB — DIGOXIN LEVEL: DIGOXIN LVL: 1.3 ug/L (ref 0.8–2.0)

## 2015-10-23 MED ORDER — COLCHICINE 0.6 MG PO TABS
ORAL_TABLET | ORAL | Status: DC
Start: 2015-10-23 — End: 2015-11-22

## 2015-10-23 MED ORDER — ALLOPURINOL 100 MG PO TABS: 100.0000 mg | ORAL_TABLET | Freq: Two times a day (BID) | ORAL | Status: DC

## 2015-10-23 NOTE — Progress Notes (Signed)
TRANSITIONAL CARE CLINIC  Admit date: 10/09/15 Discharge date: 10/17/15  Date of telephone encounter: 10/18/15  PCP: None  HPI: Michael [name] is a 54 y.o. male with a history of hypertension, hyperlipidemia, mitral valve replacement with porcine valve, tricuspid valve repair, paroxysmal atrial fibrillation who had been noncompliant with medications and was referred from the Cardiology office to the ED for admission for acute systolic heart failure (with a 20lb weight gain) and A. fib with RVR.  He was placed on IV diuresis, heparin was added to Coumadin due to a subtherapeutic INR, ACE inhibitor was stopped due to worsening CKD and trending up of creatinine to 4 and Hydralazine was added. Metoprolol was increased to 125 mg twice daily for better rate control and digoxin was added to regimen.  His most recent 2-D echo from 08/2015 revealed an EF of 30-35%, with moderately to severely reduced systolic function. The patient also had left lower extremity edema and was placed on Keflex for presumptive cellulitis with a low suspicion for DVT given he was on Coumadin. His condition gradually improved with resulting diuresis and a discharge weight of 192 pounds and discharge creatinine of 2.2;  An INR check was arranged at the Coumadin clinic.    Interval history: He was offered the services of the interpreter line today which he refused and stated he was able to communicate in [place].  Complains of right foot pain and swelling for 2 weeks and denies any fever or history of trauma; has completed a course of Keflex which he was prescribed for left foot pain and swelling which resulted in resolution in symptoms.  He brings in all his medications today some of which contradicts what is recorded in his discharge summary and med list. His discharge summary indicates he should be on metoprolol 150 mg twice a day but his med list reveals he is on Coreg and metoprolol which he is taking at the 100  mg twice daily; med list reveals he should be on Lasix 80 mg twice daily but he takes Lasix 80 mg in the morning and 40 mg in the evening.  His last INR check at the Coumadin clinic was 2.0 Patient has No headache, No chest pain, No abdominal pain - No Nausea, No new weakness tingling or numbness, No Cough - SOB.  No Known Allergies Past Medical History  Diagnosis Date  . Coronary artery disease   . Diabetes mellitus without complication ([place])   . Hypertension   . CHF (congestive heart failure) ([place])   . 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 H/O tricuspid valve repair 2012  . Paroxysmal atrial fibrillation (H[facility])   . Diabetes mellitus type 2 in obese ([place])   . Obstructive sleep apnea   . Chronic kidney disease (CKD), stage IV (severe) ([place])   . Anemia of chronic disease   . Pulmonary HTN ([place])    Current Outpatient Prescriptions on File Prior to Visit  Medication Sig Dispense Refill  . acetaminophen (TYLENOL) 325 MG tablet Take 650 mg by mouth every 6 (six) hours as needed for headache (pain).    Marland Kitchen digoxin (LANOXIN) 0.125 MG tablet Take 1 tablet (0.125 mg total) by mouth daily. 30 tablet 11  . furosemide (LASIX) 80 MG tablet Take 1 tablet (80 mg total) by mouth 2 (two) times daily. 60 tablet 11  . hydrALAZINE (APRESOLINE) 10 MG tablet Take 1 tablet (10 mg total) by mouth 3 (three) times daily. 90 tablet 11  .  isosorbide mononitrate (IMDUR) 30 MG 24 hr tablet Take 1 tablet (30 mg total) by mouth daily. 30 tablet 11  . Metoprolol Tartrate 75 MG TABS Take 150 mg by mouth every 12 (twelve) hours. 120 tablet 11  . potassium chloride SA (K-DUR,KLOR-CON) 20 MEQ tablet Take 20 mEq by mouth 2 (two) times daily.    Marland Kitchen warfarin (COUMADIN) 5 MG tablet Take 1 tablet (5 mg total) by mouth daily. 10 tablet 0   No current facility-administered medications on file prior to visit.   History reviewed. No pertinent family history. Social History   Social History  . Marital Status:  Single    Spouse Name: N/A  . Number of Children: N/A  . Years of Education: N/A   Occupational History  . Not on file.   Social History Main Topics  . Smoking status: Current Some Day Smoker  . Smokeless tobacco: Not on file  . Alcohol Use: No  . Drug Use: No  . Sexual Activity: Not on file   Other Topics Concern  . Not on file   Social History Narrative    Review of Systems: Constitutional: Negative for fever, chills, diaphoresis, activity change, appetite change and fatigue. HENT: Negative for ear pain, nosebleeds, congestion, facial swelling, rhinorrhea, neck pain, neck stiffness and ear discharge.  Eyes: Negative for pain, discharge, redness, itching and visual disturbance. Respiratory: Negative for cough, choking, chest tightness, shortness of breath, wheezing and stridor.  Cardiovascular: Negative for chest pain, palpitations and leg swelling. Gastrointestinal: Negative for abdominal distention. Genitourinary: Negative for dysuria, urgency, frequency, hematuria, flank pain, decreased urine volume, difficulty urinating and dyspareunia.  Musculoskeletal: see hpi Neurological: Negative for dizziness, tremors, seizures, syncope, facial asymmetry, speech difficulty, weakness, light-headedness, numbness and headaches.  Hematological: Negative for adenopathy. Does not bruise/bleed easily. Psychiatric/Behavioral: Negative for hallucinations, behavioral problems, confusion, dysphoric mood, decreased concentration and agitation.    Objective:   Filed Vitals:   10/22/15 1438  BP: 97/57  Pulse: 86  Temp: 97.9 F (36.6 C)  Resp: 16   Wt Readings from Last 3 Encounters:  10/22/15 203 lb (92.08 kg)  10/17/15 192 lb 6.4 oz (87.272 kg)  10/09/15 228 lb (103.42 kg)    Physical Exam: Constitutional: Patient appears well-developed and well-nourished. No distress. HENT: Normocephalic, atraumatic, External right and left ear normal. Oropharynx is clear and moist.  Eyes:  Conjunctivae and EOM are normal. PERRLA, no scleral icterus. Neck: Normal ROM. Neck supple. No JVD. No tracheal deviation. No thyromegaly. CVS: RRR, S1/S2 +, no murmurs, no gallops, no carotid bruit.  Pulmonary: Effort and breath sounds normal, no stridor, rhonchi, wheezes, rales.  Abdominal: Soft. BS +,  no distension, tenderness, rebound or guarding.  Musculoskeletal: Right foot dorsal pedal edema with mild erythema and tender to palpation.  Lymphadenopathy: No lymphadenopathy noted, cervical, inguinal or axillary Neuro: Alert. Normal reflexes, muscle tone coordination. No cranial nerve deficit. Skin: Skin is warm and dry. No rash noted. Not diaphoretic. No erythema. No pallor. Psychiatric: Normal mood and affect. Behavior, judgment, thought content normal.  Lab Results  Component Value Date   WBC 11.7* 10/22/2015   HGB 9.3* 10/22/2015   HCT 28.5* 10/22/2015   MCV 75.8* 10/22/2015   PLT 462* 10/22/2015   Lab Results  Component Value Date   CREATININE 3.68* 10/22/2015   BUN 72* 10/22/2015   NA 137 10/22/2015   K 5.3 10/22/2015   CL 104 10/22/2015   CO2 27 10/22/2015    Lab Results  Component Value Date  HGBA1C 7.8* 09/11/2015       Assessment and plan:  CHF: NYHA class 2, EF of 30-35%. Weight is up 11 pounds from discharge 5 days ago (unsure if 192 lb discharge weight is accurate) He has been advised to take his Lasix 80 mg twice a day (he states his son places his medication his pillbox and so I have advised him to come in with his son if possible at the next office visit.) Advised on daily weight checks, low-sodium diet, limit fluids to less than 2 L per day.  Chronic kidney disease stage IV: In the setting of CHF and his needing Lasix for diuresis to maintain euvolemia this will be a challenge. I am referring him to nephrology for optimization of management.  Hypertension: His blood pressure is on the hypotensive side and his medications will have to be reviewed  for possible decrease in dose of Coreg and metoprolol but given he'll be seeing cardiology in 2 days I will hold off on  making changes at this time  Type 2 diabetes mellitus: A1c is 7.8. Glycemic control be less strict given his multiple comorbidities. He states he is taking 20 units rather than 30 units of Lantus which appears on his med list and so I have effected this change. Prescription for testing supplies written for the patient and I will review his blood sugar log of his next visit.  Atrial fibrillation with RVR: Last INR was 2.0 at the Coumadin clinic; it is 1.7 today at the clinic here. I will make no changes as his INR is being monitored at the Coumadin clinic.  Right foot pain: ? Cellulitis. I will treat presumptively with Keflex meanwhile a uric acid level has been sent off to exclude gout.       [name], [facility] [phone] 10/23/2015, 8:51 AM

## 2015-10-23 NOTE — Telephone Encounter (Signed)
Spoke to patient and verified name and date of birth.  Gave results of blood test and him having gout.  Explained how to take the two new medications sent to his pharmacy.  Patient verbalized understanding and stated he would pick up medications today.

## 2015-10-23 NOTE — Progress Notes (Signed)
Cardiology Office Note   Date:  10/24/2015   ID:  Michael, [name] 1961/02/22, MRN [id number]   Patient Care Team: No Pcp Per Patient as PCP - General ([place]) Fay Records, MD as Consulting Physician (Cardiology)    Chief Complaint  Patient presents with  . Hospitalization Follow-up  . Congestive Heart Failure     History of Present Illness: Michael [name] is a 54 y.o. [name] male with a hx of mitral valve disease status post bioprosthetic mitral valve replacement in 2012 at [facility] in [place], tricuspid valve repair, ?CAD s/p CABG (notes from hospital are conflicting - patient denies hx of CABG), PAF, CKD, OSA, diabetes, HTN, HL, CHF, pulmonary hypertension, anemia, poor adherence with medications. He was admitted in October with AF with RVR and acute on chronic systolic CHF. He was seen back in the office in follow-up but then readmitted 11/22-11/30 with acute on chronic systolic CHF. His weight was up about 20 pounds. Weights were felt to be inaccurate but ranged anywhere from 192-202 at discharge. ACE inhibitor was stopped secondary to worsening creatinine and he was placed on hydralazine, nitrates. Heart rates were controlled and metoprolol was resumed with good rate control. He had left lower extremity edema and pain. He was placed on antibiotics for suspected cellulitis.    He was seen 2 days ago by primary care. Uric acid level was elevated. Antibiotics were resumed for presumed lower extremity cellulitis. Of note, potassium was elevated at 5.3 and creatinine was increased from discharge (2.23 >> 3.68). Dig level was 1.3.  Blood pressure has been somewhat low. He does not have his medications with him today. He is unsure if he takes carvedilol or metoprolol or both. His son helps with medications. He seems to be able to speak [place] but is somewhat difficult to understand at times. He tells me that his breathing is better since he went to the hospital. He sleeps  on 2 pillows. Denies PND. He still has some lower extremity edema. Right leg is increased compared to the left. He denies chest discomfort or syncope. He has a nonproductive cough. He denies wheezing. He denies bloody stools. He does have occasional epistaxis. He no longer smokes.   Studies/Reports Reviewed Today:  Echo 09/11/15 Mild LVH, EF 30-35%, mitral valve bioprosthesis present without evidence of stenosis or regurgitation, severe LAE, mild RVE with mild to moderately reduced RVSF, moderate RAE  L Venous Duplex 09/11/15 No DVT   Past Medical History  Diagnosis Date  . Coronary artery disease   . Diabetes mellitus without complication ([place])   . Hypertension   . CHF (congestive heart failure) ([place])   . 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 H/O tricuspid valve repair 2012  . Paroxysmal atrial fibrillation (HCC)   . Diabetes mellitus type 2 in obese ([place])   . Obstructive sleep apnea   . Chronic kidney disease (CKD), stage IV (severe) ([place])   . Anemia of chronic disease   . Pulmonary HTN ([place])     Past Surgical History  Procedure Laterality Date  . Cardiac surgery       Current Outpatient Prescriptions  Medication Sig Dispense Refill  . acetaminophen (TYLENOL) 325 MG tablet Take 650 mg by mouth every 6 (six) hours as needed for headache (pain).    Marland Kitchen allopurinol (ZYLOPRIM) 100 MG tablet Take 1 tablet (100 mg total) by mouth 2 (two) times daily. 60 tablet 2  . Blood Glucose Monitoring Suppl (ACCU-CHEK AVIVA)  device Use as Instructed 3 times daily 1 each 0  . carvedilol (COREG) 25 MG tablet Take 25 mg by mouth 2 (two) times daily with a meal.    . cephALEXin (KEFLEX) 500 MG capsule Take 1 capsule (500 mg total) by mouth every 12 (twelve) hours. 13 capsule 0  . colchicine 0.6 MG tablet Take 2 tablets (1.2 mg) at the onset of acute gout attack, may repeat 1 tablet (0.6 mg) in 2 hours if symptoms persist 30 tablet 2  . digoxin (LANOXIN) 0.125 MG tablet Take 1  tablet (0.125 mg total) by mouth daily. 30 tablet 11  . furosemide (LASIX) 80 MG tablet Take 1 tablet (80 mg total) by mouth 2 (two) times daily. (Patient taking differently: Take 80 mg by mouth daily. ) 60 tablet 11  . glucose blood (ACCU-CHEK AVIVA) test strip Use 3 times daily as directed. 100 each 12  . hydrALAZINE (APRESOLINE) 10 MG tablet Take 1 tablet (10 mg total) by mouth 3 (three) times daily. 90 tablet 11  . insulin glargine (LANTUS) 100 UNIT/ML injection Inject 0.2 mLs (20 Units total) into the skin at bedtime. 10 mL 2  . isosorbide mononitrate (IMDUR) 30 MG 24 hr tablet Take 0.5 tablets (15 mg total) by mouth daily. 30 tablet 11  . Lancet Devices (ACCU-CHEK SOFTCLIX) lancets Use as instructed 3 times daily 1 each 5  . Metoprolol Tartrate 75 MG TABS Take 150 mg by mouth every 12 (twelve) hours. 120 tablet 11  . naproxen (NAPROSYN) 500 MG tablet Take 500 mg by mouth 2 (two) times daily with a meal.    . potassium chloride SA (K-DUR,KLOR-CON) 20 MEQ tablet Take 1 tablet (20 mEq total) by mouth daily. 30 tablet 11  . Vitamin D, Ergocalciferol, (DRISDOL) 50000 UNITS CAPS capsule Take 50,000 Units by mouth every 7 (seven) days.    Marland Kitchen warfarin (COUMADIN) 5 MG tablet Take 1 tablet (5 mg total) by mouth daily. 10 tablet 0   No current facility-administered medications for this visit.    Allergies:   Review of patient's allergies indicates no known allergies.    Social History:   Social History   Social History  . Marital Status: Single    Spouse Name: N/A  . Number of Children: N/A  . Years of Education: N/A   Social History Main Topics  . Smoking status: Current Some Day Smoker  . Smokeless tobacco: None  . Alcohol Use: No  . Drug Use: No  . Sexual Activity: Not Asked   Other Topics Concern  . None   Social History Narrative     Family History:   Family History  Problem Relation Age of Onset  . Family history unknown: Yes      ROS:   Please see the history of  present illness.   Review of Systems  All other systems reviewed and are negative.     PHYSICAL EXAM: VS:  BP 100/60 mmHg  Pulse 73  Ht 5\' 4"  (1.626 m)  Wt 202 lb 12.8 oz (91.989 kg)  BMI 34.79 kg/m2    Wt Readings from Last 3 Encounters:  10/24/15 202 lb 12.8 oz (91.989 kg)  10/22/15 203 lb (92.08 kg)  10/17/15 192 lb 6.4 oz (87.272 kg)     GEN: Well nourished, well developed, in no acute distress HEENT: normal Neck: JVP 5-6 cm,  no masses Cardiac:  Normal S1/S2, irreg irreg rhythm; no murmur ,  no rubs or gallops, trace -1+ bilateral LE  edema (L > R).  Respiratory:  Decreased breath sounds bilaterally, no wheezing, rhonchi or rales. GI: soft, nontender, nondistended, + BS MS: no deformity or atrophy Skin: warm and dry  Neuro:  CNs II-XII intact, Strength and sensation are intact Psych: Normal affect   EKG:  EKG is ordered today.  It demonstrates:   Atrial fibrillation, HR 73, RSR prime V1-2, diffuse ST abnormality-question digoxin effect, QTc 430 milliseconds   Recent Labs: 09/11/2015: Magnesium 2.0 10/09/2015: B Natriuretic Peptide 666.2*; TSH 3.289 10/22/2015: ALT 12; BUN 72*; Creat 3.68*; Hemoglobin 9.3*; Platelets 462*; Potassium 5.3; Sodium 137    Lipid Panel No results found for: CHOL, TRIG, HDL, CHOLHDL, VLDL, LDLCALC, LDLDIRECT    ASSESSMENT AND PLAN:  1. Chronic Systolic CHF:  Overall, volume status appears to be stable. Recent creatinine is increased compared to discharge creatinine. Given his chronic kidney disease and recent significant history of acute CHF, I am hesitant cut back on his diuretics just yet. We will bring him back for repeat BMET in the next couple of days. If his creatinine is stable, I would recommend continuing his current dose of Lasix. If his creatinine is worsening, we will need to cut back on his dose. He does not have a scale and therefore cannot weigh himself. Ultimately, this patient may benefit from being followed in the heart  failure clinic.  He will be brought back in close follow-up.  2. Dilated Cardiomyopathy:  Etiology unclear. Apparently, records were requested from [facility] in [place]. I cannot locate these. We will again request his records. He was originally admitted with A. fib with RVR. Cardiomyopathy may be related to tachycardia. Rate is much better controlled. He has significantly enlarged bilateral atria. He will not likely be able to maintain sinus rhythm. He is not on an ACE inhibitor or ARB secondary to chronic kidney disease. He is taking a beta blocker. However, he currently has carvedilol and metoprolol listed. He will ask his son to check this for him when he comes home and notify us which beta blocker he is taking. Continue hydralazine, nitrates. Blood pressures tend to run low. I will decrease his isosorbide to 15 mg daily.  3. Persistent AFib:  Rate controlled.  He remains on Coumadin. INR will be checked again today. As noted above, I am not certain he will maintain sinus rhythm. Rate control therapy would appear to be the most appropriate strategy. With his chronic kidney disease, digoxin will have to be monitored closely.  I will have him get a trough Dig level in the next couple of days. Continue beta-blocker.   4. HTN:  BP running low.  Will decrease Imdur to 15 mg QD.   5. CKD:  Stage 4-5.  Referral to Nephrology pending.  As noted, Creatinine increased since DC. As noted, I am hesitant to cut back on diuretics too soon and risk recurrent volume overload.  Repeat BMET in the next couple of days. K+ level high normal 2 days ago.  Will decrease K+ to 20 mEq QD.   6. Valvular Heart Disease:   S/p bioprosthetic MVR and TV repair 2012 at [facility] in [place].  Stable MVR at echo in 10/16.  Will request records from [facility] in [place]. Continue SBE prophylaxis.    7. Gout:  Per Primary Care.  8. Anemia:  Likely related to chronic disease in the setting of CKD.   9. Diabetes Mellitus:  FU with PCP.        Medication Changes: Current medicines  are reviewed at length with the patient today.  Concerns regarding medicines are as outlined above.  The following changes have been made:   Discontinued Medications   No medications on file   Modified Medications   Modified Medication Previous Medication   ISOSORBIDE MONONITRATE (IMDUR) 30 MG 24 HR TABLET isosorbide mononitrate (IMDUR) 30 MG 24 hr tablet      Take 0.5 tablets (15 mg total) by mouth daily.    Take 1 tablet (30 mg total) by mouth daily.   POTASSIUM CHLORIDE SA (K-DUR,KLOR-CON) 20 MEQ TABLET potassium chloride SA (K-DUR,KLOR-CON) 20 MEQ tablet      Take 1 tablet (20 mEq total) by mouth daily.    Take 20 mEq by mouth daily.   New Prescriptions   No medications on file   Labs/ tests ordered today include:   Orders Placed This Encounter  Procedures  . Basic Metabolic Panel (BMET)  . Digoxin level  . EKG 12-Lead     Disposition:    FU with Dr. [name] or me in 2 weeks.     Signed, Versie Starks, MHS 10/24/2015 1:40 PM    [facility] [address], [place], [place]  [address] Phone: [phone]; Fax: [phone]

## 2015-10-23 NOTE — Telephone Encounter (Signed)
-----   Message from [name], MD sent at 10/23/2015  8:48 AM EST ----- Labs reveal he has gout which explains his right foot pain and swelling for which I have sent 2 medications to his pharmacy. The colchicine is only for acute attacks while the allopurinol is for prevention and should only be taken once daily once attack has subsided

## 2015-10-24 ENCOUNTER — Ambulatory Visit (INDEPENDENT_AMBULATORY_CARE_PROVIDER_SITE_OTHER): Payer: Medicare Other | Admitting: Pharmacist

## 2015-10-24 ENCOUNTER — Ambulatory Visit (INDEPENDENT_AMBULATORY_CARE_PROVIDER_SITE_OTHER): Payer: Medicare Other | Admitting: Physician Assistant

## 2015-10-24 ENCOUNTER — Encounter: Payer: Self-pay | Admitting: Physician Assistant

## 2015-10-24 VITALS — BP 100/60 | HR 73 | Ht 64.0 in | Wt 202.8 lb

## 2015-10-24 DIAGNOSIS — I4891 Unspecified atrial fibrillation: Secondary | ICD-10-CM | POA: Diagnosis not present

## 2015-10-24 DIAGNOSIS — Z5181 Encounter for therapeutic drug level monitoring: Secondary | ICD-10-CM | POA: Diagnosis not present

## 2015-10-24 DIAGNOSIS — I481 Persistent atrial fibrillation: Secondary | ICD-10-CM | POA: Diagnosis not present

## 2015-10-24 DIAGNOSIS — D62 Acute posthemorrhagic anemia: Secondary | ICD-10-CM

## 2015-10-24 DIAGNOSIS — I420 Dilated cardiomyopathy: Secondary | ICD-10-CM | POA: Diagnosis not present

## 2015-10-24 DIAGNOSIS — I10 Essential (primary) hypertension: Secondary | ICD-10-CM

## 2015-10-24 DIAGNOSIS — Z952 Presence of prosthetic heart valve: Secondary | ICD-10-CM

## 2015-10-24 DIAGNOSIS — I5022 Chronic systolic (congestive) heart failure: Secondary | ICD-10-CM

## 2015-10-24 DIAGNOSIS — I079 Rheumatic tricuspid valve disease, unspecified: Secondary | ICD-10-CM | POA: Diagnosis not present

## 2015-10-24 DIAGNOSIS — I4819 Other persistent atrial fibrillation: Secondary | ICD-10-CM

## 2015-10-24 DIAGNOSIS — I059 Rheumatic mitral valve disease, unspecified: Secondary | ICD-10-CM | POA: Diagnosis not present

## 2015-10-24 DIAGNOSIS — N184 Chronic kidney disease, stage 4 (severe): Secondary | ICD-10-CM

## 2015-10-24 DIAGNOSIS — Z954 Presence of other heart-valve replacement: Secondary | ICD-10-CM

## 2015-10-24 LAB — POCT INR: INR: 1.3

## 2015-10-24 MED ORDER — ISOSORBIDE MONONITRATE ER 30 MG PO TB24: 15.0000 mg | ORAL_TABLET | Freq: Every day | ORAL | Status: DC

## 2015-10-24 MED ORDER — POTASSIUM CHLORIDE CRYS ER 20 MEQ PO TBCR: 20.0000 meq | EXTENDED_RELEASE_TABLET | Freq: Every day | ORAL | Status: DC

## 2015-10-24 NOTE — Patient Instructions (Addendum)
Medication Instructions:  Check your medications when you get home. Call us back and let us know if you are taking: Metoprolol Tartrate (Lopressor) or Carvedilol (Coreg) or BOTH ; PHONE # [phone]  ADDENDUM:  1. DECREASE POTASSIUM TO MEQ DAILY 2. DECREASE IMDUR TO 15 MG DAILY  Labwork: Friday 12/9 FOR BMET AND DIGOXIN LEVEL; YOU WILL NEED TO HOLD DIGOXIN THE MORNING OF LAB WORK; YOU CAN TAKE THE DIGOXIN AFTER LAB IS DONE  Testing/Procedures: NONE  Follow-Up: 11/07/15 @ 2:40 WITH SCOTT WEAVER, PAC  Any Other Special Instructions Will Be Listed Below (If Applicable). WE HAVE REQUESTED RECORDS FROM [facility]   If you need a refill on your cardiac medications before your next appointment, please call your pharmacy.

## 2015-10-31 ENCOUNTER — Ambulatory Visit (INDEPENDENT_AMBULATORY_CARE_PROVIDER_SITE_OTHER): Payer: Medicare Other | Admitting: *Deleted

## 2015-10-31 DIAGNOSIS — I079 Rheumatic tricuspid valve disease, unspecified: Secondary | ICD-10-CM | POA: Diagnosis not present

## 2015-10-31 DIAGNOSIS — I4891 Unspecified atrial fibrillation: Secondary | ICD-10-CM | POA: Diagnosis not present

## 2015-10-31 DIAGNOSIS — Z5181 Encounter for therapeutic drug level monitoring: Secondary | ICD-10-CM

## 2015-10-31 DIAGNOSIS — I059 Rheumatic mitral valve disease, unspecified: Secondary | ICD-10-CM | POA: Diagnosis not present

## 2015-10-31 LAB — POCT INR: INR: 2

## 2015-11-01 ENCOUNTER — Telehealth: Payer: Self-pay

## 2015-11-01 NOTE — Telephone Encounter (Signed)
This Case Manager placed call to patient to check status.  Patient indicated he was "fine" and denied shortness of breath and swelling in his lower extremities.  Patient indicated he had been weighing himself daily, and limiting his fluids to < 2L/day.  Patient also indicated he was being compliant his medications. Patient indicated he did not have any questions about his medications. Reminded patient of his upcoming appointments:  Cardiology appointment on 11/07/15 at 1440, and coumadin clinic appointment on 11/07/15 at 1530.  Also reminded patient of Transitional Care Clinic follow-up appointment on 11/14/15 at 1415 with Dr. Jarold Song. Patient informed to keep a blood glucose log and to bring log to his upcoming Transitional Care follow-up appointment. Patient verbalized understanding. No additional questions/concerns identified.  Call completed using [employer] ([place] interpreter [phone]).

## 2015-11-07 ENCOUNTER — Ambulatory Visit (INDEPENDENT_AMBULATORY_CARE_PROVIDER_SITE_OTHER): Payer: Medicare Other | Admitting: Physician Assistant

## 2015-11-07 ENCOUNTER — Ambulatory Visit (INDEPENDENT_AMBULATORY_CARE_PROVIDER_SITE_OTHER): Payer: Medicare Other | Admitting: *Deleted

## 2015-11-07 ENCOUNTER — Encounter: Payer: Self-pay | Admitting: Physician Assistant

## 2015-11-07 VITALS — BP 102/64 | HR 72 | Ht 64.0 in | Wt 201.4 lb

## 2015-11-07 DIAGNOSIS — I38 Endocarditis, valve unspecified: Secondary | ICD-10-CM

## 2015-11-07 DIAGNOSIS — I482 Chronic atrial fibrillation, unspecified: Secondary | ICD-10-CM

## 2015-11-07 DIAGNOSIS — I079 Rheumatic tricuspid valve disease, unspecified: Secondary | ICD-10-CM | POA: Diagnosis not present

## 2015-11-07 DIAGNOSIS — Z5181 Encounter for therapeutic drug level monitoring: Secondary | ICD-10-CM

## 2015-11-07 DIAGNOSIS — I5022 Chronic systolic (congestive) heart failure: Secondary | ICD-10-CM

## 2015-11-07 DIAGNOSIS — I420 Dilated cardiomyopathy: Secondary | ICD-10-CM

## 2015-11-07 DIAGNOSIS — N184 Chronic kidney disease, stage 4 (severe): Secondary | ICD-10-CM | POA: Diagnosis not present

## 2015-11-07 DIAGNOSIS — I4891 Unspecified atrial fibrillation: Secondary | ICD-10-CM | POA: Diagnosis not present

## 2015-11-07 DIAGNOSIS — I059 Rheumatic mitral valve disease, unspecified: Secondary | ICD-10-CM | POA: Diagnosis not present

## 2015-11-07 DIAGNOSIS — I509 Heart failure, unspecified: Secondary | ICD-10-CM | POA: Diagnosis not present

## 2015-11-07 DIAGNOSIS — I10 Essential (primary) hypertension: Secondary | ICD-10-CM

## 2015-11-07 LAB — BASIC METABOLIC PANEL
BUN: 34 mg/dL — ABNORMAL HIGH (ref 7–25)
CALCIUM: 9.6 mg/dL (ref 8.6–10.3)
CHLORIDE: 102 mmol/L (ref 98–110)
CO2: 24 mmol/L (ref 20–31)
CREATININE: 2.14 mg/dL — AB (ref 0.70–1.33)
GLUCOSE: 108 mg/dL — AB (ref 65–99)
Potassium: 4.4 mmol/L (ref 3.5–5.3)
Sodium: 134 mmol/L — ABNORMAL LOW (ref 135–146)

## 2015-11-07 LAB — POCT INR: INR: 2.8

## 2015-11-07 NOTE — Progress Notes (Signed)
Cardiology Office Note    Date:  11/07/2015   ID:  Nic, Lerner [birth year], MRN [id number]  PCP:  No PCP Per Patient  Cardiologist:  Dr. [name]   Electrophysiologist:  n/a  Chief Complaint  Patient presents with  . Follow-up  . Congestive Heart Failure    History of Present Illness:  Michael [name] is a 54 y.o. 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 He was admitted in October with AF with RVR and acute on chronic systolic CHF. He was seen back in the office in follow-up but then readmitted 11/22-11/30 with acute on chronic systolic CHF. His weight was up about 20 pounds. Weights were felt to be inaccurate but ranged anywhere from 192-202 at discharge. ACE inhibitor was stopped secondary to worsening creatinine and he was placed on hydralazine, nitrates. Heart rates were controlled and metoprolol was resumed with good rate control. He had left lower extremity edema and pain. He was placed on antibiotics for suspected cellulitis.   I saw him in follow-up 10/24/15. He had recently been seen a primary care. Potassium was elevated at 5.3 and creatinine had increased from 2.23 to 3.68. He had been placed on treatment for gout.  Blood pressure was low. He reported improved breathing since discharge.  Volume status appeared to be improved. I continued him on his same dose of Lasix.  I arranged a follow-up BMET and digoxin level in several days but he did not show for this.  I again requested records from [facility] in [place]. Those have not been received.  Returns for follow-up.  He is here again today by himself. We did not have an interpreter until 40 minutes after his appointment. He denies  chest pain, significant dyspnea, orthopnea, PND or edema. Denies syncope. He does not have his medicines with him today. He does not know what he is taking. We have not yet been able to reconcile if he is taking Coreg, metoprolol tartrate or both.   Past Medical History  Diagnosis Date  . Coronary artery disease   . Diabetes mellitus without complication ([place])   . Hypertension   . CHF (congestive heart failure) ([place])   . 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 H/O tricuspid valve repair 2012  . Paroxysmal atrial fibrillation (HCC)   . Diabetes mellitus type 2 in obese ([place])   . Obstructive sleep apnea   . Chronic kidney disease (CKD), stage IV (severe) ([place])   . Anemia of chronic disease   . Pulmonary HTN ([place])     Past Surgical History  Procedure Laterality Date  . Cardiac surgery      Current Outpatient Prescriptions  Medication Sig Dispense Refill  . acetaminophen (TYLENOL) 325 MG tablet Take 650 mg by mouth every 6 (six) hours as needed for headache (pain).    Marland Kitchen allopurinol (ZYLOPRIM) 100 MG tablet Take 1 tablet (100 mg total) by mouth 2 (two) times daily. 60 tablet 2  . Blood Glucose Monitoring Suppl (ACCU-CHEK AVIVA) device Use as Instructed 3 times daily 1 each 0  . carvedilol (COREG) 25 MG tablet Take 25 mg by mouth 2 (two) times daily with a meal.    . cephALEXin (KEFLEX) 500 MG capsule  Take 1 capsule (500 mg total) by mouth every 12 (twelve) hours. 13 capsule 0  . colchicine 0.6 MG tablet Take 2 tablets (1.2 mg) at the onset of acute gout attack, may repeat 1 tablet (0.6 mg) in 2 hours if symptoms persist 30 tablet 2  . digoxin (LANOXIN) 0.125 MG tablet Take 1 tablet (0.125 mg total) by mouth daily. 30 tablet 11  . furosemide (LASIX) 80 MG tablet Take 1 tablet (80 mg total) by mouth 2 (two) times daily. (Patient taking differently: Take 80 mg by mouth daily. ) 60 tablet 11  . glucose blood (ACCU-CHEK AVIVA) test strip Use 3 times daily as directed. 100 each  12  . hydrALAZINE (APRESOLINE) 10 MG tablet Take 1 tablet (10 mg total) by mouth 3 (three) times daily. 90 tablet 11  . insulin glargine (LANTUS) 100 UNIT/ML injection Inject 0.2 mLs (20 Units total) into the skin at bedtime. 10 mL 2  . isosorbide mononitrate (IMDUR) 30 MG 24 hr tablet Take 0.5 tablets (15 mg total) by mouth daily. 30 tablet 11  . Lancet Devices (ACCU-CHEK SOFTCLIX) lancets Use as instructed 3 times daily 1 each 5  . Metoprolol Tartrate 75 MG TABS Take 150 mg by mouth every 12 (twelve) hours. 120 tablet 11  . naproxen (NAPROSYN) 500 MG tablet Take 500 mg by mouth 2 (two) times daily with a meal.    . potassium chloride SA (K-DUR,KLOR-CON) 20 MEQ tablet Take 1 tablet (20 mEq total) by mouth daily. 30 tablet 11  . Vitamin D, Ergocalciferol, (DRISDOL) 50000 UNITS CAPS capsule Take 50,000 Units by mouth every 7 (seven) days.    Marland Kitchen warfarin (COUMADIN) 5 MG tablet Take 1 tablet (5 mg total) by mouth daily. 10 tablet 0   No current facility-administered medications for this visit.    Allergies:   Review of patient's allergies indicates no known allergies.   Social History   Social History  . Marital Status: Single    Spouse Name: N/A  . Number of Children: N/A  . Years of Education: N/A   Social History Main Topics  . Smoking status: Current Some Day Smoker  . Smokeless tobacco: None  . Alcohol Use: No  . Drug Use: No  . Sexual Activity: Not Asked   Other Topics Concern  . None   Social History Narrative     Family History:  The patient's family history is negative for Heart attack.   ROS:   Please see the history of present illness.    ROS All other systems reviewed and are negative.   PHYSICAL EXAM:   VS:  BP 102/64 mmHg  Pulse 72  Ht 5\' 4"  (1.626 m)  Wt 201 lb 6.4 oz (91.354 kg)  BMI 34.55 kg/m2   GEN: Well nourished, well developed, in no acute distress HEENT: normal Neck: no JVD, no masses Cardiac: Normal S1/S2, irregularly irregular rhythm; no  murmurs, rubs, or gallops, no edema  Respiratory:  Decreased breath sounds bilaterally; no wheezing, rhonchi or rales GI: soft, nontender MS: no deformity or atrophy Skin: warm and dry Neuro:  no focal deficits  Psych: Alert and oriented x 3, normal affect  Wt Readings from Last 3 Encounters:  11/07/15 201 lb 6.4 oz (91.354 kg)  10/24/15 202 lb 12.8 oz (91.989 kg)  10/22/15 203 lb (92.08 kg)      Studies/Labs Reviewed:   EKG:  EKG is ordered today.  The ekg ordered today demonstrates atrial fibrillation, HR 72, normal axis, diffuse  ST abnormality-? Digitalis effect, QTc 442 ms  Recent Labs: 09/11/2015: Magnesium 2.0 10/09/2015: B Natriuretic Peptide 666.2*; TSH 3.289 10/22/2015: ALT 12; BUN 72*; Creat 3.68*; Hemoglobin 9.3*; Platelets 462*; Potassium 5.3; Sodium 137   Recent Lipid Panel No results found for: CHOL, TRIG, HDL, CHOLHDL, VLDL, LDLCALC, LDLDIRECT  Additional studies/ records that were reviewed today include:   Echo 09/11/15 Mild LVH, EF 30-35%, mitral valve bioprosthesis present without evidence of stenosis or regurgitation, severe LAE, mild RVE with mild to moderately reduced RVSF, moderate RAE  L Venous Duplex 09/11/15 No DVT   ASSESSMENT:    1. Chronic systolic CHF (congestive heart failure) ([place])   2. Dilated cardiomyopathy ([place])   3. Chronic atrial fibrillation ([place])   4. Essential hypertension   5. CKD (chronic kidney disease), stage 4 (severe) (HCC)   6. Valvular heart disease   7. Atrial fibrillation with rapid ventricular response (HCC)     PLAN:  In order of problems listed above:  1. Chronic Systolic CHF: Overall, volume status appears to be stable. Continue current dose of Lasix. Check BMET today.  2. Dilated Cardiomyopathy: Etiology unclear. Records were requested from [facility] in [place].  They have not been received.  He was originally admitted with A. fib with RVR. Cardiomyopathy may be related to tachycardia. Rate is much better  controlled. He has significantly enlarged bilateral atria. He will not likely be able to maintain sinus rhythm. He is not on an ACE inhibitor or ARB secondary to chronic kidney disease. He is taking a beta blocker. However, we currently do not know if he is taking carvedilol and/or metoprolol.  Continue hydralazine, nitrates.   3. Chronic AFib: Rate controlled. He remains on Coumadin.  Check Dig level.  4. HTN: controlled.    5. CKD: Stage 4-5. Referral to Nephrology pending. Check BMET today.     6. Valvular Heart Disease: S/p bioprosthetic MVR and TV repair 2012 at [facility] in [place]. Stable MVR at echo in 10/16. Will request records from [facility] in [place]. Continue SBE prophylaxis.     Medication Adjustments/Labs and Tests Ordered: Current medicines are reviewed at length with the patient today.  Concerns regarding medicines are outlined above.  Medication changes, Labs and Tests ordered today are listed below. Patient Instructions  Medication Instructions:  Your physician recommends that you continue on your current medications as directed. Please refer to the Current Medication list given to you today.   Labwork: 1. TODAY BMET, DIGOXIN LEVEL  Testing/Procedures: NONE  Follow-Up: 6-8 WEEKS Michael [name], PAC SAME DAY DR. Harrington Challenger IS IN THE OFFICE (YOU WILL NEED AN INTERPRETER AT YOUR VISIT)  Any Other Special Instructions Will Be Listed Below (If Applicable). MAKE SURE TO BRING YOUR MEDICATIONS TO YOUR NEXT COUMADIN APPT SO THAT THEY MAY BE REVIEWED TO MAKE SURE WE HAVE YOUR MEDICATION LIST CORRECT.    If you need a refill on your cardiac medications before your next appointment, please call your pharmacy.        Signed, Richardson Dopp, PA-C  11/07/2015 3:44 PM    [facility] [address], [place], [place]  [address] Phone: [phone]; Fax: [phone]

## 2015-11-07 NOTE — Patient Instructions (Signed)
Medication Instructions:  Your physician recommends that you continue on your current medications as directed. Please refer to the Current Medication list given to you today.   Labwork: 1. TODAY BMET, DIGOXIN LEVEL  Testing/Procedures: NONE  Follow-Up: 6-8 WEEKS SCOTT WEAVER, PAC SAME DAY DR. Harrington Challenger IS IN THE OFFICE (YOU WILL NEED AN INTERPRETER AT YOUR VISIT)  Any Other Special Instructions Will Be Listed Below (If Applicable). MAKE SURE TO BRING YOUR MEDICATIONS TO YOUR NEXT COUMADIN APPT SO THAT THEY MAY BE REVIEWED TO MAKE SURE WE HAVE YOUR MEDICATION LIST CORRECT.    If you need a refill on your cardiac medications before your next appointment, please call your pharmacy.

## 2015-11-08 ENCOUNTER — Telehealth: Payer: Self-pay | Admitting: Physician Assistant

## 2015-11-08 LAB — DIGOXIN LEVEL: Digoxin Level: 0.9 ug/L (ref 0.8–2.0)

## 2015-11-08 NOTE — Telephone Encounter (Signed)
Records received from [place] gave to Jonni Sanger

## 2015-11-09 ENCOUNTER — Telehealth: Payer: Self-pay | Admitting: *Deleted

## 2015-11-09 NOTE — Telephone Encounter (Signed)
Pt notified of lab results by phone with verbal understanding.  

## 2015-11-13 ENCOUNTER — Telehealth: Payer: Self-pay

## 2015-11-13 NOTE — Telephone Encounter (Signed)
Call placed to the patient with the assistance of Armed forces operational officer # [phone].  The patient confirmed his appointment for tomorrow, 11/14/15  @ 1415 and also confirmed that he has transportation to the clinic.  As per the interpreter, he stated that he is " doing fine. "  Reminded him to bring his weight log and blood sugar log to his appointment tomorrow and he stated that he would.  He had no questions /problems at this time.

## 2015-11-14 ENCOUNTER — Ambulatory Visit (INDEPENDENT_AMBULATORY_CARE_PROVIDER_SITE_OTHER): Payer: Medicare Other | Admitting: Pharmacist

## 2015-11-14 ENCOUNTER — Telehealth: Payer: Self-pay

## 2015-11-14 ENCOUNTER — Ambulatory Visit: Payer: Medicare Other | Admitting: Family Medicine

## 2015-11-14 DIAGNOSIS — I059 Rheumatic mitral valve disease, unspecified: Secondary | ICD-10-CM | POA: Diagnosis not present

## 2015-11-14 DIAGNOSIS — Z5181 Encounter for therapeutic drug level monitoring: Secondary | ICD-10-CM

## 2015-11-14 DIAGNOSIS — I4891 Unspecified atrial fibrillation: Secondary | ICD-10-CM | POA: Diagnosis not present

## 2015-11-14 DIAGNOSIS — I079 Rheumatic tricuspid valve disease, unspecified: Secondary | ICD-10-CM | POA: Diagnosis not present

## 2015-11-14 LAB — POCT INR: INR: 2

## 2015-11-14 NOTE — Telephone Encounter (Signed)
At the request of Dr Jarold Song, a call was placed to the patient to ask him to have a family member accompany him to his appointment this afternoon and to remind him again to bring his medications and blood sugar and weight logs with him. Dr Jarold Song would like to review his medications with a family member if possible.  Call placed to # [phone] (cell) with the assistance of a [place] interpreter # [phone] from [facility].  A voice mail message was left requesting a call back to # [phone].

## 2015-11-15 ENCOUNTER — Telehealth: Payer: Self-pay

## 2015-11-15 NOTE — Telephone Encounter (Signed)
This Case Manager placed call to patient to discuss rescheduling [facility] follow-[facility] appointment as patient cancelled appointment on 11/14/15.  Unable to reach patient; voicemail left requesting return call to [phone]. Call placed with assistance of a [name] interpreter [phone]).

## 2015-11-22 ENCOUNTER — Encounter: Payer: Self-pay | Admitting: Internal Medicine

## 2015-11-22 ENCOUNTER — Ambulatory Visit: Payer: Medicare Other | Attending: Internal Medicine | Admitting: Internal Medicine

## 2015-11-22 VITALS — BP 98/62 | HR 79 | Temp 98.0°F | Resp 16 | Ht 64.0 in | Wt 195.2 lb

## 2015-11-22 DIAGNOSIS — Z953 Presence of xenogenic heart valve: Secondary | ICD-10-CM | POA: Insufficient documentation

## 2015-11-22 DIAGNOSIS — I4891 Unspecified atrial fibrillation: Secondary | ICD-10-CM | POA: Diagnosis not present

## 2015-11-22 DIAGNOSIS — I2510 Atherosclerotic heart disease of native coronary artery without angina pectoris: Secondary | ICD-10-CM | POA: Insufficient documentation

## 2015-11-22 DIAGNOSIS — G4733 Obstructive sleep apnea (adult) (pediatric): Secondary | ICD-10-CM | POA: Insufficient documentation

## 2015-11-22 DIAGNOSIS — M109 Gout, unspecified: Secondary | ICD-10-CM | POA: Diagnosis not present

## 2015-11-22 DIAGNOSIS — E119 Type 2 diabetes mellitus without complications: Secondary | ICD-10-CM

## 2015-11-22 DIAGNOSIS — F17200 Nicotine dependence, unspecified, uncomplicated: Secondary | ICD-10-CM | POA: Diagnosis not present

## 2015-11-22 DIAGNOSIS — I509 Heart failure, unspecified: Secondary | ICD-10-CM

## 2015-11-22 DIAGNOSIS — Z79899 Other long term (current) drug therapy: Secondary | ICD-10-CM | POA: Diagnosis not present

## 2015-11-22 DIAGNOSIS — N184 Chronic kidney disease, stage 4 (severe): Secondary | ICD-10-CM

## 2015-11-22 DIAGNOSIS — Z7901 Long term (current) use of anticoagulants: Secondary | ICD-10-CM | POA: Diagnosis not present

## 2015-11-22 DIAGNOSIS — I272 Other secondary pulmonary hypertension: Secondary | ICD-10-CM | POA: Diagnosis not present

## 2015-11-22 DIAGNOSIS — I130 Hypertensive heart and chronic kidney disease with heart failure and stage 1 through stage 4 chronic kidney disease, or unspecified chronic kidney disease: Secondary | ICD-10-CM | POA: Insufficient documentation

## 2015-11-22 DIAGNOSIS — I480 Paroxysmal atrial fibrillation: Secondary | ICD-10-CM | POA: Insufficient documentation

## 2015-11-22 DIAGNOSIS — Z794 Long term (current) use of insulin: Secondary | ICD-10-CM | POA: Insufficient documentation

## 2015-11-22 LAB — POCT INR: INR: 1.5

## 2015-11-22 LAB — GLUCOSE, POCT (MANUAL RESULT ENTRY): POC Glucose: 137 mg/dl — AB (ref 70–99)

## 2015-11-22 LAB — POCT GLYCOSYLATED HEMOGLOBIN (HGB A1C): HEMOGLOBIN A1C: 6.4

## 2015-11-22 NOTE — Progress Notes (Signed)
Patient here to establish care Patient has a history of heart disease, diabetes HTN Here for follow up from [facility]

## 2015-11-22 NOTE — Patient Instructions (Signed)
Your father should only be on Allopurinol-----I have thrown away the Colchicine  He has 3 bottles of Furosemide---He should be taking 80 mg twice per day only. No other dose should be used. So I will let you adjust the bottles from there.   Please only use one pharmacy. I have called the [facility] on [address] and they have adjusted his medications  I am contacting his cardiologist to see if he should be on Metoprolol or Coreg. He should not be on both. I will call you when I figure it out.   Please no longer give him Naproxen---it is not good for his kidneys. This is a pain medication.   If you have questions please call. All other medications are ok. Please take bag of medicine with him to Cardiology appointment. It is best if you can go with him so that you know which medications he needs to be on and if anything changes

## 2015-11-22 NOTE — Progress Notes (Signed)
Patient ID: Michael [name], male   DOB: [birth year], 55 y.o.   MRN: [id number]  CC: establish care  HPI: Michael [name] is a 55 y.o. 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  Patient on on chronic coumadin therapy and attends the coumadin clinic at his cardiology clinic. His last INR check was on 12/28 and was WNL.Review of EMR reveals that patient has been seen by his Cardiologist recently and has been unable to determine if patient is taking coreg and Metoprolol. Patient has a bag of medication with him today that has both coreg and metoprolol and claims that he takes both. Upon calling several pharmacies that patient attends it appears that he last filled Coreg ins September and Metoprolol 100 mg on December 1st. Review of patients medications also reveal that he is taking both colchicine and allopurinol. He has 3 bottles of Lasix in his bag all with different instructions. Patient reports that his son fixes his pill bottles and believes he gives a tablet from each bottle. Each bottle has a different pharmacy on it. He now reports that he plans to only go to [place] on [address].  He currently takes 20 units of Lantus daily and denies hypoglycemic events. He checks his sugars only once per day upon awakening. He does not exercise and does not weigh himself daily.  No Known Allergies Past Medical History  Diagnosis Date  . Coronary artery disease   . Diabetes mellitus without complication ([place])   . Hypertension   . CHF (congestive heart failure) ([place])   . 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 H/O tricuspid valve repair 2012  . Paroxysmal atrial fibrillation (HCC)   . Diabetes mellitus type 2 in obese ([place])   . Obstructive sleep apnea   . Chronic kidney disease  (CKD), stage IV (severe) ([place])   . Anemia of chronic disease   . Pulmonary HTN ([place])    Current Outpatient Prescriptions on File Prior to Visit  Medication Sig Dispense Refill  . acetaminophen (TYLENOL) 325 MG tablet Take 650 mg by mouth every 6 (six) hours as needed for headache (pain).    Marland Kitchen allopurinol (ZYLOPRIM) 100 MG tablet Take 1 tablet (100 mg total) by mouth 2 (two) times daily. 60 tablet 2  . carvedilol (COREG) 25 MG tablet Take 25 mg by mouth 2 (two) times daily with a meal.    . colchicine 0.6 MG tablet Take 2 tablets (1.2 mg) at the onset of acute gout attack, may repeat 1 tablet (0.6 mg) in 2 hours if symptoms persist 30 tablet 2  . digoxin (LANOXIN) 0.125 MG tablet Take 1 tablet (0.125 mg total) by mouth daily. 30 tablet 11  . furosemide (LASIX) 80 MG tablet Take 1 tablet (80 mg total) by mouth 2 (two) times daily. (Patient taking differently: Take 80 mg by mouth daily. ) 60 tablet 11  . hydrALAZINE (APRESOLINE) 10 MG tablet Take 1 tablet (10 mg total) by mouth 3 (three) times daily. 90 tablet 11  . isosorbide mononitrate (IMDUR) 30 MG 24 hr tablet Take 0.5 tablets (15 mg total) by mouth daily. 30 tablet 11  . Metoprolol Tartrate 75 MG TABS Take 150 mg by mouth every 12 (twelve) hours. 120 tablet 11  . naproxen (NAPROSYN) 500 MG tablet Take 500 mg by mouth  2 (two) times daily with a meal.    . potassium chloride SA (K-DUR,KLOR-CON) 20 MEQ tablet Take 1 tablet (20 mEq total) by mouth daily. 30 tablet 11  . Vitamin D, Ergocalciferol, (DRISDOL) 50000 UNITS CAPS capsule Take 50,000 Units by mouth every 7 (seven) days.    Marland Kitchen warfarin (COUMADIN) 5 MG tablet Take 1 tablet (5 mg total) by mouth daily. 10 tablet 0  . Blood Glucose Monitoring Suppl (ACCU-CHEK AVIVA) device Use as Instructed 3 times daily 1 each 0  . glucose blood (ACCU-CHEK AVIVA) test strip Use 3 times daily as directed. 100 each 12  . insulin glargine (LANTUS) 100 UNIT/ML injection Inject 0.2 mLs (20 Units total) into the  skin at bedtime. 10 mL 2  . Lancet Devices (ACCU-CHEK SOFTCLIX) lancets Use as instructed 3 times daily 1 each 5   No current facility-administered medications on file prior to visit.   Family History  Problem Relation Age of Onset  . Heart attack Neg Hx    Social History   Social History  . Marital Status: Single    Spouse Name: N/A  . Number of Children: N/A  . Years of Education: N/A   Occupational History  . Not on file.   Social History Main Topics  . Smoking status: Current Some Day Smoker  . Smokeless tobacco: Not on file  . Alcohol Use: No  . Drug Use: No  . Sexual Activity: Not on file   Other Topics Concern  . Not on file   Social History Narrative    Review of Systems  Respiratory: Positive for shortness of breath.   Cardiovascular: Negative for chest pain, palpitations, orthopnea and leg swelling.  Genitourinary: Negative for frequency.  Neurological: Negative for dizziness and tingling.  Endo/Heme/Allergies: Negative for polydipsia.  All other systems reviewed and are negative.   Objective:   Filed Vitals:   11/22/15 1439  BP: 98/62  Pulse: 79  Temp: 98 F (36.7 C)  Resp: 16    Physical Exam  Constitutional: He is oriented to person, place, and time.  Neck: No JVD present.  Cardiovascular: Normal rate, regular rhythm and normal heart sounds.   Pulmonary/Chest: Effort normal and breath sounds normal. He has no wheezes. He has no rales.  Musculoskeletal: He exhibits no edema.  Neurological: He is alert and oriented to person, place, and time.  Skin: Skin is warm and dry.  Psychiatric: He has a normal mood and affect.     Lab Results  Component Value Date   WBC 11.7* 10/22/2015   HGB 9.3* 10/22/2015   HCT 28.5* 10/22/2015   MCV 75.8* 10/22/2015   PLT 462* 10/22/2015   Lab Results  Component Value Date   CREATININE 2.14* 11/07/2015   BUN 34* 11/07/2015   NA 134* 11/07/2015   K 4.4 11/07/2015   CL 102 11/07/2015   CO2 24  11/07/2015    Lab Results  Component Value Date   HGBA1C 6.40 11/22/2015   Lipid Panel  No results found for: CHOL, TRIG, HDL, CHOLHDL, VLDL, LDLCALC     Assessment and plan:   Diagnoses and all orders for this visit:  Congestive heart failure, unspecified congestive heart failure chronicity, unspecified congestive heart failure type ([place]) I have stressed the importance of daily weights and calling his cardiologist or myself when his weight is up greater than 2 pounds in one day or 5 pounds in one week. Patient verbalized understanding.  I will contact his Cardiologist to see if  they prefer the patient on Coreg or Metoprolol and then have nursing staff to alert his pharmacy on specific changes.   Atrial fibrillation with rapid ventricular response (HCC) -     INR Patients INR was done today and was 1.5. I will not adjust medications today since he is regularly seen by cardiology clinic.   Type 2 diabetes mellitus without complication, without long-term current use of insulin (HCC) -     Glucose (CBG) -     POCT glycosylated hemoglobin (Hb A1C) Patients diabetes is well control as evidence by a1c 6.4%.  Patient will continue with current therapy and continue to make necessary lifestyle changes.  Reviewed foot care, diet, walking, annual health maintenance with patient.   CKD (chronic kidney disease), stage IV ([place]) Patients last GFR was 31. It is very likely that patient's kidneys are not function as well due to his chronic disease's and misuse of medications. Naproxen was removed from patients bag and pharmacy was alerted to discontinue medication from his list. Explained that he should not take any NSAID's due to kidney function. Due to language barrier unsure if patient fully understands.     Gout Colchicine was also removed from his bag. He will stay only on Allopurinol. Pharmacy aware.  Polypharmacy I stressed to patient the importance of using only one pharmacy. He will only  use [facility] on [address] who now has the correct medications on file.      Interpreter was used to communicate directly with patient for the entire encounter including providing detailed patient instructions.   Total time spent with patient was 45 minutes. > 50% spent counseling, coordination care, med recon, and speaking with pharmacist.   Return in about 2 months (around 01/20/2016) for follow up .   Lance Bosch, [facility] [phone] 11/22/2015, 2:54 PM

## 2015-11-23 ENCOUNTER — Other Ambulatory Visit: Payer: Self-pay | Admitting: Internal Medicine

## 2015-11-23 ENCOUNTER — Telehealth: Payer: Self-pay

## 2015-11-23 ENCOUNTER — Telehealth: Payer: Self-pay | Admitting: Physician Assistant

## 2015-11-23 DIAGNOSIS — Z79899 Other long term (current) drug therapy: Secondary | ICD-10-CM | POA: Insufficient documentation

## 2015-11-23 HISTORY — DX: Other long term (current) drug therapy: Z79.899

## 2015-11-23 NOTE — Telephone Encounter (Signed)
-----   Message from Lance Bosch, NP sent at 11/23/2015  9:10 AM EST ----- Regarding: please clarify I have forwarded you a copy of the above named patients encounter from yesterday. I believe I have addressed some of the medication conflicts. Per patient he has been taking both Coreg and Metoprolol. He did have both pills in his bag but the pharmacist states that his last Coreg script was filled in September and Metoprolol 100 mg on December 1st. Please let me know which drug and dose you would prefer patient to be on and I will have staff to alert pharmacy. I plan to have case management involved so they can possibly find a pharmacy who can prepackage his medications

## 2015-11-23 NOTE — Telephone Encounter (Signed)
Pt has been advised of appt with Richardson Dopp, PA 12/07/15 @ 10:10 for f/u due to med changes per PA.Marland KitchenPt will need interpreter.

## 2015-11-23 NOTE — Telephone Encounter (Signed)
This Case Manager was asked by Chari Manning, NP to follow-up with patient because patient seen by provider on 11/22/15, and patient had medications from multiple pharmacies and taking incorrect dosages of some medications. Chari Manning, NP indicated she contacted [facility] on [address] and pharmacy now has the correct medications on file except she had to clarify with Cardiologist if patient should be on Coreg or Metoprolol.  Chari Manning, NP received communication from Dr. Kathlen Mody who indicated patient should be taking metoprolol bid and stop Coreg. Chari Manning, NP updated medication list.   This Case Manager placed call to patient to inform he should STOP taking coreg and take metoprolol tartrate 150 mg bid. Patient informed he should use medication list from office visit on 11/22/15 and take medications as prescribed. Reviewed medications with patient. Inquired if patient's son managing his medications. He indicated his son is no longer managing his medications, and he is now managing medications himself.  Inquired if patient would like this Case Manager to speak with a family member and review medications with them as well.  Patient indicated his children are at work but indicated Case Manager could attempt to speak with them next week.  Discussed with patient that he may benefit from using a pharmacy that puts medications in blister packs, and patient declined. Patient indicated he planned to use [facility] on [address]. Discussed importance of only using one pharmacy for his medications. Patient verbalized understanding and indicated he planned to only use [facility] on [facility] for his medications from now on.   Inquired if patient knew when his appointment was with [facility] and patient indicated he had not been called yet with an appointment time. In addition, discussed with patient that he may benefit from Case Management services with [place]. Patient agreeable to  referral to [place]. Spoke with patient Research scientist (life sciences) (Interpreter # [phone]).  Referral sent to Erenest Rasher with [place] as patient would benefit from disease management, disease education, and medication management services.  Call placed to [facility] on [facility] to provide update that patient should STOP coreg and continue taking metoprolol tartrate 150 mg bid. Was informed that coreg was filled at [facility] on [address] [phone]). Call placed to [facility] on [address] and coreg discontinued.  Spoke with Rosendo Gros who indicated patient had not used their location since 07/2015.  In addition, placed call to [facility] and spoke with Erline Levine to determine if patient has a scheduled appointment. She indicated patient would be contacted next week regarding his appointment, and his appointment would be the end of January 2017 or early February 2017.  Most recent progress note sent to clinic. No additional needs/concerns identified at this time.

## 2015-11-23 NOTE — Telephone Encounter (Signed)
See attached message. I replied to Ms. Feliciana Rossetti and asked to have patient stop Coreg and stay on Metoprolol Tartrate 150 mg bid. Please arrange FU in the next 2 weeks so I can make sure his HR is controlled. Richardson Dopp, PA-C   11/23/2015 2:07 PM

## 2015-11-27 ENCOUNTER — Encounter: Payer: Self-pay | Admitting: Physician Assistant

## 2015-11-28 ENCOUNTER — Telehealth: Payer: Self-pay

## 2015-11-28 NOTE — Telephone Encounter (Signed)
Call placed to the patient with the assistance of [employer] # [phone] to check on his status and review his medication list. He said that he is " feeling okay" but  he was not home and did not have his list with him to review.  He said that he would like to review the medication list and would be available later in the week. He stated that the best time for a call would be after 1200. He also said that there are no family members for this CM to review the medication list with to assist him with medication management.  He stated that he has all of his medications and is taking them as ordered. This CM inquired if he had been to his appointment that was scheduled for today at the coumadin clinic and he said no. He noted that he has a list of appointments but again was not at home and did not have the list with him.   He reported no questions/conerns at this time.   CM to call him again tomorrow and to review his medications and upcoming appointment schedule.

## 2015-11-29 ENCOUNTER — Telehealth: Payer: Self-pay

## 2015-11-29 NOTE — Telephone Encounter (Signed)
This Case Manager placed call using [employer] (Interpretor # [phone]) to review his medication list. Patient's medication list thoroughly reviewed.  Patient indicated he has all medications and denied questions after medication list reviewed. Discussed blood glucose monitoring with patient, and patient indicated he had not been checking his blood glucose. Informed patient that he should check his blood glucose three times daily per Dr. Johna Sheriff instructions. Also informed patient that a script for glucometer and diabetes supplies was sent to [facility] on [address] on 10/22/15. Patient verbalized understanding.  Discussed checking blood glucose as directed and keeping blood glucose log.  Patient verbalized understanding.  Patient would benefit from further medication management and diabetes education. Was referred to [facility] on 11/23/15; however, was informed patient not eligible for [facility].  Patient agreeable to meeting with Nicoletta Ba, Clinical Pharmacist at [facility], for continued medication management and diabetes education. Appointment scheduled on 12/04/15 at 1430.    In addition, patient missed Coumadin clinic appointment on 11/28/15. Reminded patient to reschedule appointment as soon as possible and also reminded patient of upcoming Cardiology appointment on 12/07/15 at 1010 with Dr. Kathlen Mody. Patient verbalized understanding. No additional needs/concerns identified.

## 2015-12-03 ENCOUNTER — Telehealth: Payer: Self-pay

## 2015-12-03 NOTE — Telephone Encounter (Signed)
Call placed to the patient to confirm his appointment tomorrow, 12/04/15 @ 1430 with Nicoletta Ba, RPh for medication management education.  [place] interpreter # [phone] with [employer] assisted with the call. Instructed the patient to bring his medications to his appointment and he stated that he would.  No problems/questions reported. He also noted that he has transportation.

## 2015-12-04 ENCOUNTER — Ambulatory Visit: Payer: Medicare Other | Admitting: Pharmacist

## 2015-12-05 ENCOUNTER — Telehealth: Payer: Self-pay

## 2015-12-05 NOTE — Telephone Encounter (Signed)
This Case Manager placed call to patient to reschedule appointment with Nicoletta Ba, RPh for medication management and diabetes education since patient missed appointment on 12/04/15.  [place] interpreter [phone] assisted with call.  Unable to reach patient; voicemail left requesting return call.

## 2015-12-06 ENCOUNTER — Telehealth: Payer: Self-pay

## 2015-12-06 NOTE — Progress Notes (Signed)
Cardiology Office Note:    Date:  12/07/2015   ID:  Michael [name], DOB 07-22-1961, MRN [id number]  PCP:  No PCP Per Patient  Cardiologist:  Dr. [name]   Electrophysiologist:  n/a  Chief Complaint  Patient presents with  . Atrial Fibrillation    follow up  . Congestive Heart Failure    follow up    History of Present Illness:    Michael [name] is a 55 y.o. [name] male with a hx of mitral valve disease status post bioprosthetic mitral valve replacement in 2012 at [facility] in [place], tricuspid valve repair, ?CAD s/p CABG (notes from hospital are conflicting - patient denies hx of CABG), PAF, CKD, OSA, diabetes, HTN, HL, CHF, pulmonary hypertension, anemia, poor adherence with medications. Echo done in [place] in 4/12 with normal LVF (EF 60-65%).    Admitted in 10/16 with AF with RVR and a/c systolic CHF. He was seen back in the office in follow-up but then readmitted [id number] with a/c systolic CHF. His weight was up about 20 pounds.  ACE inhibitor was stopped secondary to worsening creatinine and he was placed on hydralazine, nitrates. Metoprolol was resumed with good rate control.   I have seen him a few times since November. We have been unclear about his medications and he continues to have carvedilol and metoprolol listed. His primary care provider recently contacted me. It appeared that the patient was taking both beta blockers. We requested that he stop carvedilol and remain on metoprolol. He returns for close follow-up.  Seen today with the help of interpreter.  He notes his breathing is stable.  He is NYHA 2b.  He denies orthopnea, PND, edema.  No chest pain.  No syncope.  He notes wheezing.  He just quit smoking last month.  No cough. No fevers.  No bleeding.     Past Medical History  Diagnosis Date  . Coronary artery disease   . Diabetes mellitus without complication ([place])   . Hypertension   . Chronic systolic CHF (congestive heart failure) (HCC)     a. prior  EF normal; b. Echo 10/16: Mild LVH, EF 30-35%, mitral valve bioprosthesis present without evidence of stenosis or regurgitation, severe LAE, mild RVE with mild to moderately reduced RVSF, moderate RAE  . 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 H/O tricuspid valve repair 2012  . Paroxysmal atrial fibrillation (HCC)     s/p Maze procedure at time of MVR in 2011  . Diabetes mellitus type 2 in obese ([place])   . Obstructive sleep apnea   . Chronic kidney disease (CKD), stage IV (severe) ([place])   . Anemia of chronic disease   . Pulmonary HTN ([place])   . History of echocardiogram     Echo at [facility] in [facility] 4/12: mod LVH, EF 60-65%, mod LAE, tissue MVR ok, mod TR, mod pulmo HTN with RVSP 48 mmHg  . Asthma   . GERD (gastroesophageal reflux disease)     Past Surgical History  Procedure Laterality Date  . Cardiac surgery      Current Medications: Outpatient Prescriptions Prior to Visit  Medication Sig Dispense Refill  . acetaminophen (TYLENOL) 325 MG tablet Take 650 mg by mouth every 6 (six) hours as needed for headache (pain).    Marland Kitchen allopurinol (ZYLOPRIM) 100 MG tablet Take 1 tablet (100 mg total) by mouth 2 (two) times daily. 60 tablet 2  . Blood Glucose Monitoring Suppl (ACCU-CHEK AVIVA) device Use as Instructed 3 times  daily 1 each 0  . digoxin (LANOXIN) 0.125 MG tablet Take 1 tablet (0.125 mg total) by mouth daily. 30 tablet 11  . furosemide (LASIX) 80 MG tablet Take 1 tablet (80 mg total) by mouth 2 (two) times daily. (Patient taking differently: Take 80 mg by mouth daily. ) 60 tablet 11  . glucose blood (ACCU-CHEK AVIVA) test strip Use 3 times daily as directed. 100 each 12  . hydrALAZINE (APRESOLINE) 10 MG tablet Take 1 tablet (10 mg total) by mouth 3 (three) times daily. 90 tablet 11  . insulin glargine (LANTUS) 100 UNIT/ML injection Inject 0.2 mLs (20 Units total) into the skin at bedtime. 10 mL 2  . isosorbide mononitrate (IMDUR) 30 MG 24 hr tablet Take 0.5 tablets (15 mg  total) by mouth daily. 30 tablet 11  . Lancet Devices (ACCU-CHEK SOFTCLIX) lancets Use as instructed 3 times daily 1 each 5  . naproxen (NAPROSYN) 500 MG tablet Take 500 mg by mouth 2 (two) times daily with a meal.    . potassium chloride SA (K-DUR,KLOR-CON) 20 MEQ tablet Take 1 tablet (20 mEq total) by mouth daily. 30 tablet 11  . Vitamin D, Ergocalciferol, (DRISDOL) 50000 UNITS CAPS capsule Take 50,000 Units by mouth every 7 (seven) days.    Marland Kitchen warfarin (COUMADIN) 5 MG tablet Take 1 tablet (5 mg total) by mouth daily. 10 tablet 0  . Metoprolol Tartrate 75 MG TABS Take 150 mg by mouth every 12 (twelve) hours. 120 tablet 11   No facility-administered medications prior to visit.     Allergies:   Review of patient's allergies indicates no known allergies.   Social History   Social History  . Marital Status: Single    Spouse Name: N/A  . Number of Children: N/A  . Years of Education: N/A   Social History Main Topics  . Smoking status: Current Some Day Smoker  . Smokeless tobacco: None  . Alcohol Use: No  . Drug Use: No  . Sexual Activity: Not Asked   Other Topics Concern  . None   Social History Narrative     Family History:  The patient's family history is negative for Heart attack.   ROS:   Please see the history of present illness.    ROS All other systems reviewed and are negative.   Physical Exam:    VS:  BP 102/70 mmHg  Pulse 82  Ht 5\' 4"  (1.626 m)  Wt 202 lb (91.627 kg)  BMI 34.66 kg/m2   GEN: Well nourished, well developed, in no acute distress HEENT: normal Neck: I cannot appreciate JVD, no masses Cardiac: Normal S1/S2, irreg irreg rhythm; no murmurs   Respiratory:  clear to auscultation bilaterally; no wheezing, rhonchi or rales GI: soft, nontender  MS: no deformity or atrophy Skin: warm and dry, no rash Neuro:   no focal deficits  Psych: Alert and oriented x 3, normal affect  Wt Readings from Last 3 Encounters:  12/07/15 202 lb (91.627 kg)    11/22/15 195 lb 3.2 oz (88.542 kg)  11/07/15 201 lb 6.4 oz (91.354 kg)      Studies/Labs Reviewed:    EKG:  EKG is  ordered today.  The ekg ordered today demonstrates AFib, HR 96, normal axis, diffuse ST abnormality, QTc 464 ms, no sig changes.   Recent Labs: 09/11/2015: Magnesium 2.0 10/09/2015: B Natriuretic Peptide 666.2*; TSH 3.289 10/22/2015: ALT 12; Hemoglobin 9.3*; Platelets 462* 11/07/2015: BUN 34*; Creat 2.14*; Potassium 4.4; Sodium 134*   Recent  Lipid Panel No results found for: CHOL, TRIG, HDL, CHOLHDL, VLDL, LDLCALC, LDLDIRECT  Additional studies/ records that were reviewed today include:   Echo 09/11/15 Mild LVH, EF 30-35%, mitral valve bioprosthesis present without evidence of stenosis or regurgitation, severe LAE, mild RVE with mild to moderately reduced RVSF, moderate RAE  L Venous Duplex 09/11/15 No DVT   ASSESSMENT:    1. Chronic systolic CHF (congestive heart failure) ([place])   2. Dilated cardiomyopathy ([place])   3. Chronic atrial fibrillation ([place])   4. Essential hypertension   5. CKD (chronic kidney disease), stage 4 (severe) (HCC)   6. Valvular heart disease     PLAN:    In order of problems listed above:  1. Chronic Systolic CHF: Overall, volume status appears to be stable.  He is NYHA 2b. He does note some wheezing at times. His weight is up slightly. I will check a BNP today.  2. Dilated Cardiomyopathy: Etiology unclear. Records from [facility] in [place] demonstrate EF 60-65% in 2012.  There is no record of a cardiac cath but we have to presume he had no significant CAD prior to his surgery.  It is possible his cardiomyopathy is related to tachycardia.  He is not on an ACE inhibitor or ARB secondary to chronic kidney disease.  Continue hydralazine, nitrates.  Of note, he is uncertain if he is still taking hydralazine. He will call us back later to let us know what medications he is taking.  -  At next visit, consider arranging follow-up Echo to reassess  LVF  -  If EF remains down, consider nuclear stress test to r/o ischemia (poor candidate for LHC given CKD)  -  If EF < 35% consider referral to EP for ICD   3. Chronic AFib: Rate higher today. He remains on Coumadin.   -  DC metoprolol tartrate  -  Start metoprolol succinate 150 mg twice a day  4. HTN: Controlled.   5. CKD: Stage 4-5.He has been referred to nephrology. Appointment remains pending.   6. Valvular Heart Disease: S/p bioprosthetic MVR and TV repair 2012 at [facility] in [place]. Stable MVR at echo in 10/16. Continue SBE prophylaxis.     Medication Adjustments/Labs and Tests Ordered: Current medicines are reviewed at length with the patient today.  Concerns regarding medicines are outlined above.  Medication changes, Labs and Tests ordered today are outlined in the Patient Instructions noted below. Patient Instructions  Medication Instructions:  1. STOP METOPROLOL TARTRATE  2. START METOPROLOL SUCCINATE 100 MG TABLET WITH THE DIRECTIONS TO TAKE 1 AND 1/2 TABLETS TWICE DAILY (THIS WILL = 150 MG TWICE DAILY)  Labwork: TODAY BMET, BNP  Testing/Procedures: NONE  Follow-Up: Anarie Kalish, PAC 3-4 WEEKS; BRING MEDICATIONS WITH YOU TO APPT.  PLEASE HAVE CHECK OUT SCHEDULE APPT WITH NEPHROLOGY; LOOKS LIKE THE REFERRAL IS READY  Any Other Special Instructions Will Be Listed Below (If Applicable).  CALL LATER TODAY WHEN YOU GET HOME TO LET us KNOW IF YOU ARE TAKING THE HYDRALAZINE OR NOT [phone]  If you need a refill on your cardiac medications before your next appointment, please call your pharmacy.       Signed, Richardson Dopp, PA-C  12/07/2015 11:21 AM    [facility] [address], [name], [place]  [address] Phone: [phone]; Fax: [phone]

## 2015-12-06 NOTE — Telephone Encounter (Signed)
This Case Manager placed call to patient to discuss rescheduling appointment with Nicoletta Ba, RPh for medication management and diabetes education since patient missed appointment on 12/04/15.  [place] interpreter [phone] assisted with call.  Patient agreeable to meeting with Nicoletta Ba, RPh on 12/11/15 at 1530 for medication management/diabetes education. Appointment scheduled. Reminded patient to bring all medications to appointment. Also reminded patient of Cardiology appointment on 12/07/15 at 1010 with Dr. Kathlen Mody. Patient verbalized understanding.

## 2015-12-07 ENCOUNTER — Encounter: Payer: Self-pay | Admitting: Physician Assistant

## 2015-12-07 ENCOUNTER — Ambulatory Visit (INDEPENDENT_AMBULATORY_CARE_PROVIDER_SITE_OTHER): Payer: Medicare Other | Admitting: *Deleted

## 2015-12-07 ENCOUNTER — Ambulatory Visit (INDEPENDENT_AMBULATORY_CARE_PROVIDER_SITE_OTHER): Payer: Medicare Other | Admitting: Physician Assistant

## 2015-12-07 VITALS — BP 102/70 | HR 82 | Ht 64.0 in | Wt 202.0 lb

## 2015-12-07 DIAGNOSIS — Z5181 Encounter for therapeutic drug level monitoring: Secondary | ICD-10-CM

## 2015-12-07 DIAGNOSIS — I482 Chronic atrial fibrillation, unspecified: Secondary | ICD-10-CM

## 2015-12-07 DIAGNOSIS — I059 Rheumatic mitral valve disease, unspecified: Secondary | ICD-10-CM | POA: Diagnosis not present

## 2015-12-07 DIAGNOSIS — I420 Dilated cardiomyopathy: Secondary | ICD-10-CM | POA: Diagnosis not present

## 2015-12-07 DIAGNOSIS — I509 Heart failure, unspecified: Secondary | ICD-10-CM | POA: Diagnosis not present

## 2015-12-07 DIAGNOSIS — I10 Essential (primary) hypertension: Secondary | ICD-10-CM | POA: Diagnosis not present

## 2015-12-07 DIAGNOSIS — I5022 Chronic systolic (congestive) heart failure: Secondary | ICD-10-CM

## 2015-12-07 DIAGNOSIS — I079 Rheumatic tricuspid valve disease, unspecified: Secondary | ICD-10-CM

## 2015-12-07 DIAGNOSIS — N184 Chronic kidney disease, stage 4 (severe): Secondary | ICD-10-CM

## 2015-12-07 DIAGNOSIS — I4891 Unspecified atrial fibrillation: Secondary | ICD-10-CM | POA: Diagnosis not present

## 2015-12-07 DIAGNOSIS — I38 Endocarditis, valve unspecified: Secondary | ICD-10-CM

## 2015-12-07 LAB — BASIC METABOLIC PANEL
BUN: 47 mg/dL — AB (ref 7–25)
CHLORIDE: 102 mmol/L (ref 98–110)
CO2: 23 mmol/L (ref 20–31)
CREATININE: 2.08 mg/dL — AB (ref 0.70–1.33)
Calcium: 8.9 mg/dL (ref 8.6–10.3)
GLUCOSE: 150 mg/dL — AB (ref 65–99)
Potassium: 5.3 mmol/L (ref 3.5–5.3)
Sodium: 136 mmol/L (ref 135–146)

## 2015-12-07 LAB — POCT INR: INR: 1.8

## 2015-12-07 MED ORDER — METOPROLOL SUCCINATE ER 100 MG PO TB24: 100.0000 mg | ORAL_TABLET | ORAL | Status: DC

## 2015-12-07 NOTE — Patient Instructions (Signed)
Medication Instructions:  1. STOP METOPROLOL TARTRATE  2. START METOPROLOL SUCCINATE 100 MG TABLET WITH THE DIRECTIONS TO TAKE 1 AND 1/2 TABLETS TWICE DAILY (THIS WILL = 150 MG TWICE DAILY)  Labwork: TODAY BMET, BNP  Testing/Procedures: NONE  Follow-Up: SCOTT WEAVER, PAC 3-4 WEEKS; BRING MEDICATIONS WITH YOU TO APPT.  PLEASE HAVE CHECK OUT SCHEDULE APPT WITH NEPHROLOGY; LOOKS LIKE THE REFERRAL IS READY  Any Other Special Instructions Will Be Listed Below (If Applicable).  CALL LATER TODAY WHEN YOU GET HOME TO LET us KNOW IF YOU ARE TAKING THE HYDRALAZINE OR NOT [phone]  If you need a refill on your cardiac medications before your next appointment, please call your pharmacy.

## 2015-12-08 LAB — BRAIN NATRIURETIC PEPTIDE: BRAIN NATRIURETIC PEPTIDE: 462.5 pg/mL — AB (ref 0.0–100.0)

## 2015-12-10 ENCOUNTER — Telehealth: Payer: Self-pay | Admitting: *Deleted

## 2015-12-10 ENCOUNTER — Telehealth: Payer: Self-pay

## 2015-12-10 DIAGNOSIS — N184 Chronic kidney disease, stage 4 (severe): Secondary | ICD-10-CM

## 2015-12-10 DIAGNOSIS — I5042 Chronic combined systolic (congestive) and diastolic (congestive) heart failure: Secondary | ICD-10-CM

## 2015-12-10 MED ORDER — FUROSEMIDE 40 MG PO TABS: 80.0000 mg | ORAL_TABLET | ORAL | Status: DC

## 2015-12-10 NOTE — Telephone Encounter (Signed)
Pt has been notified of lab results and med changes by phone w/verbal understanding. Decrease K+ to 10 meq daily; increase lasix to 80 mg AM/40 mg PM. Pt states he is already doing this dose of lasix. BMET 1/30. Advised I will d/w PA and cb if any changes. Pt said ok.

## 2015-12-10 NOTE — Telephone Encounter (Signed)
Attempted to contact the patient to remind him of his appointment tomorrow, 12/11/15 @ 1530 with Nicoletta Ba, [facility] for medication management. Call placed to # [phone] (H) with the assistance of [name] interpreter # [phone] with [employer]. The interpreter left a voice mail message requesting that the patient return the call to # [phone].

## 2015-12-10 NOTE — Telephone Encounter (Signed)
Attempted to contact the patient to remind him of his appointment with Nicoletta Ba, [facility] the [facility] tomorrow, 12/11/15. Marita Snellen interpreter # [phone] with [employer] assisted with the call. Call placed to # [phone] (H) and a voice mail message was left noting that the CM would call back at a later time.

## 2015-12-11 ENCOUNTER — Encounter: Payer: Medicare Other | Admitting: Pharmacist

## 2015-12-11 ENCOUNTER — Telehealth: Payer: Self-pay | Admitting: *Deleted

## 2015-12-11 DIAGNOSIS — N184 Chronic kidney disease, stage 4 (severe): Secondary | ICD-10-CM

## 2015-12-11 DIAGNOSIS — I5042 Chronic combined systolic (congestive) and diastolic (congestive) heart failure: Secondary | ICD-10-CM

## 2015-12-11 MED ORDER — FUROSEMIDE 40 MG PO TABS: 80.0000 mg | ORAL_TABLET | Freq: Two times a day (BID) | ORAL | Status: DC

## 2015-12-11 NOTE — Telephone Encounter (Signed)
Reached pt tonight and advised per Brynda Rim. PA yesterday to increase lasix to 80 mg BID; keep lab appt 1/30 BMET. Pt verbalized understanding to planj of care w/read back.

## 2015-12-13 ENCOUNTER — Telehealth: Payer: Self-pay | Admitting: Physician Assistant

## 2015-12-13 NOTE — Telephone Encounter (Signed)
[facility] is needing clarification on directions  for Metoprolol .

## 2015-12-13 NOTE — Telephone Encounter (Signed)
Returned call. Med instructions clarified.

## 2015-12-17 ENCOUNTER — Other Ambulatory Visit (INDEPENDENT_AMBULATORY_CARE_PROVIDER_SITE_OTHER): Payer: Medicare Other | Admitting: *Deleted

## 2015-12-17 DIAGNOSIS — I5042 Chronic combined systolic (congestive) and diastolic (congestive) heart failure: Secondary | ICD-10-CM

## 2015-12-17 DIAGNOSIS — N184 Chronic kidney disease, stage 4 (severe): Secondary | ICD-10-CM | POA: Diagnosis not present

## 2015-12-17 DIAGNOSIS — I509 Heart failure, unspecified: Secondary | ICD-10-CM | POA: Diagnosis not present

## 2015-12-17 LAB — BASIC METABOLIC PANEL
BUN: 50 mg/dL — AB (ref 7–25)
CO2: 26 mmol/L (ref 20–31)
Calcium: 9.4 mg/dL (ref 8.6–10.3)
Chloride: 100 mmol/L (ref 98–110)
Creat: 2.07 mg/dL — ABNORMAL HIGH (ref 0.70–1.33)
GLUCOSE: 126 mg/dL — AB (ref 65–99)
Potassium: 4.8 mmol/L (ref 3.5–5.3)
SODIUM: 136 mmol/L (ref 135–146)

## 2015-12-19 ENCOUNTER — Telehealth: Payer: Self-pay | Admitting: *Deleted

## 2015-12-19 NOTE — Telephone Encounter (Signed)
Lmptcb to go over lab results 

## 2015-12-20 NOTE — Telephone Encounter (Signed)
Lmptcb x 3 for lab results

## 2015-12-23 NOTE — Progress Notes (Signed)
Cardiology Office Note:    Date:  12/24/2015   ID:  Michael [name], DOB 1960/12/26, MRN [id number]  PCP:  No PCP Per Patient  Cardiologist:  Dr. [name]   Electrophysiologist:  n/a  Chief Complaint  Patient presents with  . Congestive Heart Failure    Follow up  . Atrial Fibrillation    Follow up    History of Present Illness:    Michael [name] is a 55 y.o. [name] male with a hx of mitral valve disease status post bioprosthetic mitral valve replacement in 2012 at [facility] in [place], tricuspid valve repair, ?CAD s/p CABG (notes from hospital are conflicting - patient denies hx of CABG, records from [facility] do not mention CABG), PAF, CKD, OSA, diabetes, HTN, HL, CHF, pulmonary hypertension, anemia, poor adherence with medications. Echo done in [place] in 4/12 with normal LVF (EF 60-65%).    Admitted in 10/16 with AF with RVR and a/c systolic CHF. EF was 30-35%.  He was seen back in the office in follow-up but then readmitted [id number] with a/c systolic CHF. His weight was up about 20 pounds.  ACE inhibitor was stopped secondary to worsening creatinine and he was placed on hydralazine, nitrates. Metoprolol was resumed with good rate control.   He has previously not been clear about his medications.  We ultimately found out he was taking 2 beta-blockers.  We took him off Carvedilol and kept him on Metoprolol.  I last saw him 12/07/15.    FU BNP was elevated and I increased his Lasix further to 80 mg bid.     Past Medical History  Diagnosis Date  . Coronary artery disease   . Diabetes mellitus without complication ([place])   . Hypertension   . Chronic systolic CHF (congestive heart failure) (HCC)     a. prior EF normal; b. Echo 10/16: Mild LVH, EF 30-35%, mitral valve bioprosthesis present without evidence of stenosis or regurgitation, severe LAE, mild RVE with mild to moderately reduced RVSF, moderate RAE  . 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 H/O tricuspid valve  repair 2012  . Paroxysmal atrial fibrillation (HCC)     s/p Maze procedure at time of MVR in 2011  . Diabetes mellitus type 2 in obese ([place])   . Obstructive sleep apnea   . Chronic kidney disease (CKD), stage IV (severe) ([place])   . Anemia of chronic disease   . Pulmonary HTN ([place])   . History of echocardiogram     Echo at [facility] in [facility] 4/12: mod LVH, EF 60-65%, mod LAE, tissue MVR ok, mod TR, mod pulmo HTN with RVSP 48 mmHg  . Asthma   . GERD (gastroesophageal reflux disease)     Past Surgical History  Procedure Laterality Date  . Cardiac surgery      Current Medications: Outpatient Prescriptions Prior to Visit  Medication Sig Dispense Refill  . acetaminophen (TYLENOL) 325 MG tablet Take 650 mg by mouth every 6 (six) hours as needed for headache (pain).    Marland Kitchen allopurinol (ZYLOPRIM) 100 MG tablet Take 1 tablet (100 mg total) by mouth 2 (two) times daily. 60 tablet 2  . Blood Glucose Monitoring Suppl (ACCU-CHEK AVIVA) device Use as Instructed 3 times daily 1 each 0  . digoxin (LANOXIN) 0.125 MG tablet Take 1 tablet (0.125 mg total) by mouth daily. 30 tablet 11  . furosemide (LASIX) 40 MG tablet Take 2 tablets (80 mg total) by mouth 2 (two) times daily. 80 mg AM;  and 40 mg PM    . glucose blood (ACCU-CHEK AVIVA) test strip Use 3 times daily as directed. 100 each 12  . hydrALAZINE (APRESOLINE) 10 MG tablet Take 1 tablet (10 mg total) by mouth 3 (three) times daily. 90 tablet 11  . insulin glargine (LANTUS) 100 UNIT/ML injection Inject 0.2 mLs (20 Units total) into the skin at bedtime. 10 mL 2  . isosorbide mononitrate (IMDUR) 30 MG 24 hr tablet Take 0.5 tablets (15 mg total) by mouth daily. 30 tablet 11  . Lancet Devices (ACCU-CHEK SOFTCLIX) lancets Use as instructed 3 times daily 1 each 5  . metoprolol succinate (TOPROL-XL) 100 MG 24 hr tablet Take 1 tablet (100 mg total) by mouth as directed. take 1 and 1/2 tablets twice daily 360 tablet 3  . naproxen (NAPROSYN) 500 MG tablet Take 500  mg by mouth 2 (two) times daily with a meal.    . potassium chloride SA (K-DUR,KLOR-CON) 20 MEQ tablet Take 1 tablet (20 mEq total) by mouth daily. 30 tablet 11  . Vitamin D, Ergocalciferol, (DRISDOL) 50000 UNITS CAPS capsule Take 50,000 Units by mouth every 7 (seven) days.    Marland Kitchen warfarin (COUMADIN) 5 MG tablet Take 1 tablet (5 mg total) by mouth daily. 10 tablet 0   No facility-administered medications prior to visit.     Allergies:   Review of patient's allergies indicates no known allergies.   Social History   Social History  . Marital Status: Single    Spouse Name: N/A  . Number of Children: N/A  . Years of Education: N/A   Social History Main Topics  . Smoking status: Current Some Day Smoker  . Smokeless tobacco: Not on file  . Alcohol Use: No  . Drug Use: No  . Sexual Activity: Not on file   Other Topics Concern  . Not on file   Social History Narrative     Family History:  The patient's family history is negative for Heart attack.   ROS:   Please see the history of present illness.    ROS All other systems reviewed and are negative.   Physical Exam:    VS:  There were no vitals taken for this visit.   GEN: Well nourished, well developed, in no acute distress HEENT: normal Neck: I cannot appreciate JVD, no masses Cardiac: Normal S1/S2, irreg irreg rhythm; no murmurs   Respiratory:  clear to auscultation bilaterally; no wheezing, rhonchi or rales GI: soft, nontender  MS: no deformity or atrophy Skin: warm and dry, no rash Neuro:   no focal deficits  Psych: Alert and oriented x 3, normal affect  Wt Readings from Last 3 Encounters:  12/07/15 202 lb (91.627 kg)  11/22/15 195 lb 3.2 oz (88.542 kg)  11/07/15 201 lb 6.4 oz (91.354 kg)      Studies/Labs Reviewed:    EKG:  EKG is  ordered today.  The ekg ordered today demonstrates   Recent Labs: 09/11/2015: Magnesium 2.0 10/09/2015: B Natriuretic Peptide 666.2*; TSH 3.289 10/22/2015: ALT 12; Hemoglobin  9.3*; Platelets 462* 12/17/2015: BUN 50*; Creat 2.07*; Potassium 4.8; Sodium 136   Recent Lipid Panel No results found for: CHOL, TRIG, HDL, CHOLHDL, VLDL, LDLCALC, LDLDIRECT  Additional studies/ records that were reviewed today include:   Echo 09/11/15 Mild LVH, EF 30-35%, mitral valve bioprosthesis present without evidence of stenosis or regurgitation, severe LAE, mild RVE with mild to moderately reduced RVSF, moderate RAE  L Venous Duplex 09/11/15 No DVT   ASSESSMENT:  1. Chronic systolic CHF (congestive heart failure) ([place])   2. Dilated cardiomyopathy ([place])   3. Chronic atrial fibrillation ([place])   4. Essential hypertension   5. CKD (chronic kidney disease), stage 4 (severe) ([place])   6. S/P MVR (mitral valve replacement)     PLAN:    In order of problems listed above:  1. Chronic Systolic CHF:  NYHA 2b.   2. Dilated Cardiomyopathy: Etiology unclear. Records from [facility] in [place] demonstrate EF 60-65% in 2012.  There is no record of a cardiac cath but we have to presume he had no significant CAD prior to his surgery.  It is possible his cardiomyopathy is related to tachycardia.  He is not on an ACE inhibitor or ARB secondary to worsening chronic kidney disease.  Continue beta-blocker, hydralazine, nitrates.    -   Echo  -  If EF remains down, consider nuclear stress test to r/o ischemia (poor candidate for LHC given CKD)  -  If EF < 35% consider referral to EP for ICD   3. Chronic AFib:  metoprolol succinate 150 mg twice a day  4. HTN: Controlled.   5. CKD: Stage 4-5.He has been referred to nephrology.    6. Valvular Heart Disease: S/p bioprosthetic MVR and TV repair 2012 at [facility] in [place]. Stable MVR at echo in 10/16. Continue SBE prophylaxis.     Medication Adjustments/Labs and Tests Ordered: Current medicines are reviewed at length with the patient today.  Concerns regarding medicines are outlined above.  Medication changes, Labs and Tests ordered  today are outlined in the Patient Instructions noted below. There are no Patient Instructions on file for this visit.   Signed, Richardson Dopp, PA-C  12/24/2015 1:16 PM    [facility] [place], [place], [place]  [address] Phone: [phone]; Fax: [phone]     This encounter was created in error - please disregard.

## 2015-12-24 ENCOUNTER — Encounter: Payer: Self-pay | Admitting: *Deleted

## 2015-12-24 ENCOUNTER — Encounter: Payer: Medicare Other | Admitting: Physician Assistant

## 2015-12-24 DIAGNOSIS — I482 Chronic atrial fibrillation, unspecified: Secondary | ICD-10-CM | POA: Insufficient documentation

## 2015-12-24 DIAGNOSIS — I420 Dilated cardiomyopathy: Secondary | ICD-10-CM | POA: Insufficient documentation

## 2015-12-24 HISTORY — DX: Dilated cardiomyopathy: I42.0

## 2015-12-25 NOTE — Progress Notes (Signed)
Cardiology Office Note:    Date:  12/26/2015   ID:  Michael [name], DOB 07-31-1961, MRN [id number]  PCP:  No PCP Per Patient  Cardiologist:  Dr. [name]   Electrophysiologist:  n/a  Chief Complaint  Patient presents with  . Congestive Heart Failure    follow up     History of Present Illness:     Michael [name] is a 55 y.o. [name] male with a hx of mitral valve disease status post bioprosthetic mitral valve replacement in 2012 at [facility] in [place], tricuspid valve repair, ?CAD s/p CABG (notes from hospital are conflicting - patient denies hx of CABG, records from [facility] do not mention CABG), PAF, CKD, OSA, diabetes, HTN, HL, CHF, pulmonary hypertension, anemia, poor adherence with medications. Echo done in [place] in 4/12 with normal LVF (EF 60-65%).   Admitted in 10/16 with AF with RVR and a/c systolic CHF. EF was 30-35%. He was seen back in the office in follow-up but then readmitted [id number] with a/c systolic CHF. His weight was up about 20 pounds. ACE inhibitor was stopped secondary to worsening creatinine and he was placed on hydralazine, nitrates. Metoprolol was resumed with good rate control.   He has previously not been clear about his medications. We ultimately found out he was taking 2 beta-blockers. We took him off Carvedilol and kept him on Metoprolol. I last saw him 12/07/15. FU BNP was elevated and I increased his Lasix further to 80 mg bid. He has continued to not bring his medications into his visits.  It remains unclear what he is taking.  He is seen today with the help of an interpreter by phone.  He denies chest pain or syncope. He notes worsening SOB.  Sleeps on 2 pillows.  He notes PND.  Denies LE edema.  Notes increased abdominal girth.  He notes a cough with yellow sputum. Weight is up 6 lbs on our scales. He c/o knee pain.      Past Medical History  Diagnosis Date  . Coronary artery disease   . Diabetes mellitus without complication ([place])   .  Hypertension   . Chronic systolic CHF (congestive heart failure) (HCC)     a. prior EF normal; b. Echo 10/16: Mild LVH, EF 30-35%, mitral valve bioprosthesis present without evidence of stenosis or regurgitation, severe LAE, mild RVE with mild to moderately reduced RVSF, moderate RAE  . 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 H/O tricuspid valve repair 2012  . Paroxysmal atrial fibrillation (HCC)     s/p Maze procedure at time of MVR in 2011  . Diabetes mellitus type 2 in obese ([place])   . Obstructive sleep apnea   . Chronic kidney disease (CKD), stage IV (severe) ([place])   . Anemia of chronic disease   . Pulmonary HTN ([place])   . History of echocardiogram     Echo at [facility] in [facility] 4/12: mod LVH, EF 60-65%, mod LAE, tissue MVR ok, mod TR, mod pulmo HTN with RVSP 48 mmHg  . Asthma   . GERD (gastroesophageal reflux disease)     Past Surgical History  Procedure Laterality Date  . Cardiac surgery      Current Medications: Outpatient Prescriptions Prior to Visit  Medication Sig Dispense Refill  . acetaminophen (TYLENOL) 325 MG tablet Take 650 mg by mouth every 6 (six) hours as needed for headache (pain).    Marland Kitchen allopurinol (ZYLOPRIM) 100 MG tablet Take 1 tablet (100 mg total) by mouth  2 (two) times daily. 60 tablet 2  . Blood Glucose Monitoring Suppl (ACCU-CHEK AVIVA) device Use as Instructed 3 times daily 1 each 0  . digoxin (LANOXIN) 0.125 MG tablet Take 1 tablet (0.125 mg total) by mouth daily. 30 tablet 11  . glucose blood (ACCU-CHEK AVIVA) test strip Use 3 times daily as directed. 100 each 12  . hydrALAZINE (APRESOLINE) 10 MG tablet Take 1 tablet (10 mg total) by mouth 3 (three) times daily. 90 tablet 11  . insulin glargine (LANTUS) 100 UNIT/ML injection Inject 0.2 mLs (20 Units total) into the skin at bedtime. 10 mL 2  . isosorbide mononitrate (IMDUR) 30 MG 24 hr tablet Take 0.5 tablets (15 mg total) by mouth daily. 30 tablet 11  . Lancet Devices (ACCU-CHEK SOFTCLIX)  lancets Use as instructed 3 times daily 1 each 5  . metoprolol succinate (TOPROL-XL) 100 MG 24 hr tablet Take 1 tablet (100 mg total) by mouth as directed. take 1 and 1/2 tablets twice daily 360 tablet 3  . naproxen (NAPROSYN) 500 MG tablet Take 500 mg by mouth 2 (two) times daily with a meal.    . potassium chloride SA (K-DUR,KLOR-CON) 20 MEQ tablet Take 1 tablet (20 mEq total) by mouth daily. 30 tablet 11  . Vitamin D, Ergocalciferol, (DRISDOL) 50000 UNITS CAPS capsule Take 50,000 Units by mouth every 7 (seven) days.    Marland Kitchen warfarin (COUMADIN) 5 MG tablet Take 1 tablet (5 mg total) by mouth daily. 10 tablet 0  . furosemide (LASIX) 40 MG tablet Take 2 tablets (80 mg total) by mouth 2 (two) times daily. 80 mg AM; and 40 mg PM     No facility-administered medications prior to visit.     Allergies:   Review of patient's allergies indicates no known allergies.   Social History   Social History  . Marital Status: Single    Spouse Name: N/A  . Number of Children: N/A  . Years of Education: N/A   Social History Main Topics  . Smoking status: Current Some Day Smoker  . Smokeless tobacco: None  . Alcohol Use: No  . Drug Use: No  . Sexual Activity: Not Asked   Other Topics Concern  . None   Social History Narrative     Family History:  The patient's family history is negative for Heart attack.   ROS:   Please see the history of present illness.    ROS All other systems reviewed and are negative.   Physical Exam:    VS:  BP 124/82 mmHg  Pulse 88  Ht 5\' 4"  (1.626 m)  Wt 209 lb (94.802 kg)  BMI 35.86 kg/m2  SpO2 99%   GEN: Well nourished, well developed, in no acute distress HEENT: normal Neck: no JVD, no masses Cardiac: Normal S1/S2, irreg irreg rhythm, I cannot appreciate any murmurs, no edema    Respiratory:  clear to auscultation bilaterally; no wheezing, rhonchi or rales GI: soft, distended  MS: no deformity or atrophy Skin: warm and dry, no rash Neuro:   no focal  deficits  Psych: Alert and oriented x 3, normal affect  Wt Readings from Last 3 Encounters:  12/26/15 209 lb (94.802 kg)  12/07/15 202 lb (91.627 kg)  11/22/15 195 lb 3.2 oz (88.542 kg)      Studies/Labs Reviewed:     EKG:  EKG is  ordered today.  The ekg ordered today demonstrates AFib, HR 92, diffuse ST changes similar to prior tracings. QTc 497 ms  Recent Labs: 09/11/2015: Magnesium 2.0 10/09/2015: B Natriuretic Peptide 666.2*; TSH 3.289 10/22/2015: ALT 12; Hemoglobin 9.3*; Platelets 462* 12/17/2015: BUN 50*; Creat 2.07*; Potassium 4.8; Sodium 136   Recent Lipid Panel No results found for: CHOL, TRIG, HDL, CHOLHDL, VLDL, LDLCALC, LDLDIRECT  Additional studies/ records that were reviewed today include:   Echo 09/11/15 Mild LVH, EF 30-35%, mitral valve bioprosthesis present without evidence of stenosis or regurgitation, severe LAE, mild RVE with mild to moderately reduced RVSF, moderate RAE  L Venous Duplex 09/11/15 No DVT  ASSESSMENT:     1. Chronic systolic CHF (congestive heart failure) ([place])   2. Dilated cardiomyopathy ([place])   3. Chronic atrial fibrillation ([place])   4. Essential hypertension   5. CKD (chronic kidney disease), stage IV (HCC)   6. Valvular heart disease   7. Cough      PLAN:     In order of problems listed above:  1. Chronic Systolic CHF: NYHA 3.  His weight is up.  I suspect he is volume overloaded.     -  BMET, BNP today  -  Increase Lasix to 120 mg Q am and 80 mg Q pm x 3 days >  Then Lasix 80 mg bid  -  BMET 1 week  -  CXR today   2. Dilated Cardiomyopathy: Etiology unclear. Records from [facility] in [place] demonstrate EF 60-65% in 2012. There is no record of a cardiac cath but we have to presume he had no significant CAD prior to his surgery. It is possible his cardiomyopathy is related to tachycardia. He is not on an ACE inhibitor or ARB secondary to worsening chronic kidney disease.As noted, he never brings his medications in and we  cannot confirm what he is taking.  Continue beta-blocker, hydralazine, nitrates.  -Arrange FU Echo - If EF < 35% consider referral to EP for ICD   3. Chronic AFib: Rate seems to be reasonably controlled.  Continue Digoxin, Metoprolol succinate 150 mg twice a day  4. HTN: Controlled.   5. CKD: Stage 4-5.He has been referred to nephrology. It is not clear when he will be seen by Nephrology.     6. Valvular Heart Disease: S/p bioprosthetic MVR and TV repair 2012 at [facility] in [place]. Stable MVR at echo in 10/16. Continue SBE prophylaxis.   7. Cough - Obtain CXR.      Medication Adjustments/Labs and Tests Ordered: Current medicines are reviewed at length with the patient today.  Concerns regarding medicines are outlined above.  Medication changes, Labs and Tests ordered today are outlined in the Patient Instructions noted below. Patient Instructions  Medication Instructions:  1. Take 120 mg of Lasix in the morning and 80 mg of lasix at night for 3 days. After 3 days take 80 mg Lasix twice daily.  Labwork: 1. TODAY BMET BNP 2. ON 01/04/16 REPEAT BMET DUE TO MEDICATION INCREASE ON LASIX  Testing/Procedures: 1. Your physician has requested that you have an echocardiogram. Echocardiography is a painless test that uses sound waves to create images of your heart. It provides your doctor with information about the size and shape of your heart and how well your heart's chambers and valves are working. This procedure takes approximately one hour. There are no restrictions for this procedure.  2. A chest x-ray takes a picture of the organs and structures inside the chest, including the heart, lungs, and blood vessels. This test can show several things, including, whether the heart is enlarges; whether fluid is building  up in the lungs; and whether pacemaker / defibrillator leads are still in place. THIS NEEDS TO BE DONE TOMORROW AT [place]. AT [place]  IMAGING  Follow-Up: FOLLOW UP WITH L[facility] PAC IN ONE MONTH SAME DAY DR ROSS IS IN THE OFFICE  Any Other Special Instructions Will Be Listed Below (If Applicable). PLEASE HAVE CHECK OUT CHECK ON REFERRAL TO NEPHROLOGY   PLEASE BRING YOUR MEDICATION BOTTLES WITH YOU TO YOUR NEXT VISIT.  If you need a refill on your cardiac medications before your next appointment, please call your pharmacy.     Signed, Richardson Dopp, PA-C  12/26/2015 4:53 PM    [facility] [place], [place], [place]  [address] Phone: [phone]; Fax: [phone]

## 2015-12-26 ENCOUNTER — Ambulatory Visit (INDEPENDENT_AMBULATORY_CARE_PROVIDER_SITE_OTHER): Payer: Medicare Other | Admitting: Physician Assistant

## 2015-12-26 ENCOUNTER — Ambulatory Visit (INDEPENDENT_AMBULATORY_CARE_PROVIDER_SITE_OTHER): Payer: Medicare Other | Admitting: *Deleted

## 2015-12-26 ENCOUNTER — Encounter: Payer: Self-pay | Admitting: Physician Assistant

## 2015-12-26 VITALS — BP 124/82 | HR 88 | Ht 64.0 in | Wt 209.0 lb

## 2015-12-26 DIAGNOSIS — I509 Heart failure, unspecified: Secondary | ICD-10-CM | POA: Diagnosis not present

## 2015-12-26 DIAGNOSIS — I079 Rheumatic tricuspid valve disease, unspecified: Secondary | ICD-10-CM

## 2015-12-26 DIAGNOSIS — I5042 Chronic combined systolic (congestive) and diastolic (congestive) heart failure: Secondary | ICD-10-CM

## 2015-12-26 DIAGNOSIS — I4891 Unspecified atrial fibrillation: Secondary | ICD-10-CM | POA: Diagnosis not present

## 2015-12-26 DIAGNOSIS — I482 Chronic atrial fibrillation, unspecified: Secondary | ICD-10-CM

## 2015-12-26 DIAGNOSIS — I420 Dilated cardiomyopathy: Secondary | ICD-10-CM

## 2015-12-26 DIAGNOSIS — I059 Rheumatic mitral valve disease, unspecified: Secondary | ICD-10-CM | POA: Diagnosis not present

## 2015-12-26 DIAGNOSIS — R059 Cough, unspecified: Secondary | ICD-10-CM

## 2015-12-26 DIAGNOSIS — N184 Chronic kidney disease, stage 4 (severe): Secondary | ICD-10-CM | POA: Diagnosis not present

## 2015-12-26 DIAGNOSIS — I5022 Chronic systolic (congestive) heart failure: Secondary | ICD-10-CM | POA: Diagnosis not present

## 2015-12-26 DIAGNOSIS — R05 Cough: Secondary | ICD-10-CM

## 2015-12-26 DIAGNOSIS — Z5181 Encounter for therapeutic drug level monitoring: Secondary | ICD-10-CM

## 2015-12-26 DIAGNOSIS — I38 Endocarditis, valve unspecified: Secondary | ICD-10-CM

## 2015-12-26 DIAGNOSIS — I10 Essential (primary) hypertension: Secondary | ICD-10-CM

## 2015-12-26 LAB — BASIC METABOLIC PANEL
BUN: 60 mg/dL — ABNORMAL HIGH (ref 7–25)
CO2: 24 mmol/L (ref 20–31)
Calcium: 9.3 mg/dL (ref 8.6–10.3)
Chloride: 102 mmol/L (ref 98–110)
Creat: 2.19 mg/dL — ABNORMAL HIGH (ref 0.70–1.33)
Glucose, Bld: 116 mg/dL — ABNORMAL HIGH (ref 65–99)
Potassium: 4.4 mmol/L (ref 3.5–5.3)
Sodium: 135 mmol/L (ref 135–146)

## 2015-12-26 LAB — POCT INR: INR: 1.4

## 2015-12-26 MED ORDER — FUROSEMIDE 40 MG PO TABS: 80.0000 mg | ORAL_TABLET | Freq: Two times a day (BID) | ORAL | Status: DC

## 2015-12-26 NOTE — Patient Instructions (Addendum)
Medication Instructions:  1. Take 120 mg of Lasix in the morning and 80 mg of lasix at night for 3 days. After 3 days take 80 mg Lasix twice daily.  Labwork: 1. TODAY BMET BNP 2. ON 01/04/16 REPEAT BMET DUE TO MEDICATION INCREASE ON LASIX  Testing/Procedures: 1. Your physician has requested that you have an echocardiogram. Echocardiography is a painless test that uses sound waves to create images of your heart. It provides your doctor with information about the size and shape of your heart and how well your heart's chambers and valves are working. This procedure takes approximately one hour. There are no restrictions for this procedure.  2. A chest x-ray takes a picture of the organs and structures inside the chest, including the heart, lungs, and blood vessels. This test can show several things, including, whether the heart is enlarges; whether fluid is building up in the lungs; and whether pacemaker / defibrillator leads are still in place. THIS NEEDS TO BE DONE TOMORROW AT [place]. AT [place] IMAGING  Follow-Up: FOLLOW UP WITH SCOTT WEAVER PAC IN ONE MONTH SAME DAY DR ROSS IS IN THE OFFICE  Any Other Special Instructions Will Be Listed Below (If Applicable). PLEASE HAVE CHECK OUT CHECK ON REFERRAL TO NEPHROLOGY   PLEASE BRING YOUR MEDICATION BOTTLES WITH YOU TO YOUR NEXT VISIT.  If you need a refill on your cardiac medications before your next appointment, please call your pharmacy.

## 2015-12-28 ENCOUNTER — Telehealth: Payer: Self-pay | Admitting: *Deleted

## 2015-12-28 DIAGNOSIS — I5022 Chronic systolic (congestive) heart failure: Secondary | ICD-10-CM

## 2015-12-28 DIAGNOSIS — N184 Chronic kidney disease, stage 4 (severe): Secondary | ICD-10-CM

## 2015-12-28 LAB — BRAIN NATRIURETIC PEPTIDE: BRAIN NATRIURETIC PEPTIDE: 873.6 pg/mL — AB (ref <100)

## 2015-12-28 NOTE — Telephone Encounter (Signed)
I called pt through the Interpreter Line today to go over lab results and advised pt to continue increase dose of lasix for 1 week instead of 3 days like discussed at ov yesterday. Pt verbalized understanding through Interpreter [phone]. labs 2/17

## 2015-12-29 ENCOUNTER — Emergency Department (HOSPITAL_COMMUNITY): Payer: Medicare Other

## 2015-12-29 ENCOUNTER — Encounter (HOSPITAL_COMMUNITY): Payer: Self-pay | Admitting: Family Medicine

## 2015-12-29 ENCOUNTER — Inpatient Hospital Stay (HOSPITAL_COMMUNITY)
Admission: EM | Admit: 2015-12-29 | Discharge: 2016-01-04 | DRG: 291 | Disposition: A | Discharge status: Home / Self Care | Payer: Medicare Other | Attending: Internal Medicine | Admitting: Internal Medicine

## 2015-12-29 DIAGNOSIS — K219 Gastro-esophageal reflux disease without esophagitis: Secondary | ICD-10-CM | POA: Diagnosis present

## 2015-12-29 DIAGNOSIS — I10 Essential (primary) hypertension: Secondary | ICD-10-CM | POA: Diagnosis not present

## 2015-12-29 DIAGNOSIS — N184 Chronic kidney disease, stage 4 (severe): Secondary | ICD-10-CM | POA: Diagnosis present

## 2015-12-29 DIAGNOSIS — I272 Other secondary pulmonary hypertension: Secondary | ICD-10-CM | POA: Diagnosis present

## 2015-12-29 DIAGNOSIS — G4733 Obstructive sleep apnea (adult) (pediatric): Secondary | ICD-10-CM | POA: Diagnosis present

## 2015-12-29 DIAGNOSIS — I5023 Acute on chronic systolic (congestive) heart failure: Secondary | ICD-10-CM | POA: Diagnosis present

## 2015-12-29 DIAGNOSIS — R110 Nausea: Secondary | ICD-10-CM | POA: Diagnosis not present

## 2015-12-29 DIAGNOSIS — Z9981 Dependence on supplemental oxygen: Secondary | ICD-10-CM | POA: Diagnosis not present

## 2015-12-29 DIAGNOSIS — Z7901 Long term (current) use of anticoagulants: Secondary | ICD-10-CM | POA: Diagnosis not present

## 2015-12-29 DIAGNOSIS — Z951 Presence of aortocoronary bypass graft: Secondary | ICD-10-CM | POA: Diagnosis not present

## 2015-12-29 DIAGNOSIS — J45909 Unspecified asthma, uncomplicated: Secondary | ICD-10-CM

## 2015-12-29 DIAGNOSIS — J9601 Acute respiratory failure with hypoxia: Secondary | ICD-10-CM | POA: Diagnosis not present

## 2015-12-29 DIAGNOSIS — D509 Iron deficiency anemia, unspecified: Secondary | ICD-10-CM | POA: Diagnosis not present

## 2015-12-29 DIAGNOSIS — D638 Anemia in other chronic diseases classified elsewhere: Secondary | ICD-10-CM | POA: Diagnosis present

## 2015-12-29 DIAGNOSIS — I130 Hypertensive heart and chronic kidney disease with heart failure and stage 1 through stage 4 chronic kidney disease, or unspecified chronic kidney disease: Principal | ICD-10-CM | POA: Diagnosis present

## 2015-12-29 DIAGNOSIS — J9600 Acute respiratory failure, unspecified whether with hypoxia or hypercapnia: Secondary | ICD-10-CM | POA: Diagnosis present

## 2015-12-29 DIAGNOSIS — D649 Anemia, unspecified: Secondary | ICD-10-CM | POA: Diagnosis present

## 2015-12-29 DIAGNOSIS — I482 Chronic atrial fibrillation, unspecified: Secondary | ICD-10-CM

## 2015-12-29 DIAGNOSIS — Z952 Presence of prosthetic heart valve: Secondary | ICD-10-CM | POA: Diagnosis not present

## 2015-12-29 DIAGNOSIS — I5022 Chronic systolic (congestive) heart failure: Secondary | ICD-10-CM

## 2015-12-29 DIAGNOSIS — I2510 Atherosclerotic heart disease of native coronary artery without angina pectoris: Secondary | ICD-10-CM | POA: Diagnosis present

## 2015-12-29 DIAGNOSIS — R0602 Shortness of breath: Secondary | ICD-10-CM | POA: Diagnosis not present

## 2015-12-29 DIAGNOSIS — F17200 Nicotine dependence, unspecified, uncomplicated: Secondary | ICD-10-CM | POA: Diagnosis present

## 2015-12-29 DIAGNOSIS — E1122 Type 2 diabetes mellitus with diabetic chronic kidney disease: Secondary | ICD-10-CM | POA: Diagnosis present

## 2015-12-29 DIAGNOSIS — J209 Acute bronchitis, unspecified: Secondary | ICD-10-CM | POA: Diagnosis present

## 2015-12-29 DIAGNOSIS — I420 Dilated cardiomyopathy: Secondary | ICD-10-CM | POA: Diagnosis present

## 2015-12-29 DIAGNOSIS — I480 Paroxysmal atrial fibrillation: Secondary | ICD-10-CM | POA: Diagnosis present

## 2015-12-29 DIAGNOSIS — Z794 Long term (current) use of insulin: Secondary | ICD-10-CM | POA: Diagnosis not present

## 2015-12-29 DIAGNOSIS — I059 Rheumatic mitral valve disease, unspecified: Secondary | ICD-10-CM | POA: Diagnosis present

## 2015-12-29 DIAGNOSIS — J069 Acute upper respiratory infection, unspecified: Secondary | ICD-10-CM | POA: Diagnosis present

## 2015-12-29 DIAGNOSIS — R05 Cough: Secondary | ICD-10-CM | POA: Diagnosis not present

## 2015-12-29 DIAGNOSIS — R001 Bradycardia, unspecified: Secondary | ICD-10-CM | POA: Diagnosis not present

## 2015-12-29 HISTORY — DX: Acute respiratory failure with hypoxia: J96.01

## 2015-12-29 LAB — CBC
HCT: 33.3 % — ABNORMAL LOW (ref 39.0–52.0)
HEMOGLOBIN: 10.4 g/dL — AB (ref 13.0–17.0)
MCH: 21.4 pg — ABNORMAL LOW (ref 26.0–34.0)
MCHC: 31.2 g/dL (ref 30.0–36.0)
MCV: 68.5 fL — ABNORMAL LOW (ref 78.0–100.0)
PLATELETS: 326 10*3/uL (ref 150–400)
RBC: 4.86 MIL/uL (ref 4.22–5.81)
RDW: 18.8 % — ABNORMAL HIGH (ref 11.5–15.5)
WBC: 6 10*3/uL (ref 4.0–10.5)

## 2015-12-29 LAB — BASIC METABOLIC PANEL
ANION GAP: 12 (ref 5–15)
BUN: 61 mg/dL — ABNORMAL HIGH (ref 6–20)
CALCIUM: 9 mg/dL (ref 8.9–10.3)
CHLORIDE: 104 mmol/L (ref 101–111)
CO2: 24 mmol/L (ref 22–32)
CREATININE: 2.08 mg/dL — AB (ref 0.61–1.24)
GFR calc non Af Amer: 34 mL/min — ABNORMAL LOW (ref >60)
GFR, EST AFRICAN AMERICAN: 39 mL/min — AB (ref >60)
Glucose, Bld: 171 mg/dL — ABNORMAL HIGH (ref 65–99)
Potassium: 4.9 mmol/L (ref 3.5–5.1)
SODIUM: 140 mmol/L (ref 135–145)

## 2015-12-29 LAB — DIGOXIN LEVEL

## 2015-12-29 LAB — I-STAT TROPONIN, ED: TROPONIN I, POC: 0.02 ng/mL (ref 0.00–0.08)

## 2015-12-29 LAB — PROTIME-INR
INR: 1.52 — ABNORMAL HIGH (ref 0.00–1.49)
PROTHROMBIN TIME: 18.3 s — AB (ref 11.6–15.2)

## 2015-12-29 LAB — BRAIN NATRIURETIC PEPTIDE: B NATRIURETIC PEPTIDE 5: 1453.8 pg/mL — AB (ref 0.0–100.0)

## 2015-12-29 LAB — GLUCOSE, CAPILLARY: GLUCOSE-CAPILLARY: 226 mg/dL — AB (ref 65–99)

## 2015-12-29 MED ORDER — HEPARIN (PORCINE) IN NACL 100-0.45 UNIT/ML-% IJ SOLN
1350.0000 [IU]/h | INTRAMUSCULAR | Status: DC
  Administered 2015-12-29: 1200 [IU]/h via INTRAVENOUS
  Administered 2015-12-31 – 2016-01-01 (×2): 1350 [IU]/h via INTRAVENOUS
  Filled 2015-12-29 (×5): qty 250

## 2015-12-29 MED ORDER — SODIUM CHLORIDE 0.9% FLUSH
3.0000 mL | Freq: Two times a day (BID) | INTRAVENOUS | Status: DC
Start: 2015-12-29 — End: 2016-01-04
  Administered 2015-12-29 – 2016-01-04 (×12): 3 mL via INTRAVENOUS

## 2015-12-29 MED ORDER — ALBUTEROL SULFATE (2.5 MG/3ML) 0.083% IN NEBU
5.0000 mg | INHALATION_SOLUTION | Freq: Once | RESPIRATORY_TRACT | Status: AC
  Administered 2015-12-29: 5 mg via RESPIRATORY_TRACT

## 2015-12-29 MED ORDER — DIGOXIN 0.25 MG/ML IJ SOLN
0.2500 mg | Freq: Once | INTRAMUSCULAR | Status: AC
  Administered 2015-12-29: 0.25 mg via INTRAVENOUS
  Filled 2015-12-29: qty 2

## 2015-12-29 MED ORDER — METOPROLOL TARTRATE 50 MG PO TABS
50.0000 mg | ORAL_TABLET | Freq: Two times a day (BID) | ORAL | Status: DC
  Administered 2015-12-29: 50 mg via ORAL
  Filled 2015-12-29: qty 2

## 2015-12-29 MED ORDER — DIGOXIN 125 MCG PO TABS
0.1250 mg | ORAL_TABLET | Freq: Every day | ORAL | Status: DC
Start: 2015-12-30 — End: 2015-12-30

## 2015-12-29 MED ORDER — FUROSEMIDE 10 MG/ML IJ SOLN
40.0000 mg | Freq: Two times a day (BID) | INTRAMUSCULAR | Status: DC
  Administered 2015-12-30: 40 mg via INTRAVENOUS
  Filled 2015-12-29: qty 4

## 2015-12-29 MED ORDER — ONDANSETRON HCL 4 MG/2ML IJ SOLN: 4.0000 mg | Freq: Four times a day (QID) | INTRAMUSCULAR | Status: DC | PRN

## 2015-12-29 MED ORDER — INSULIN ASPART 100 UNIT/ML ~~LOC~~ SOLN
0.0000 [IU] | Freq: Every day | SUBCUTANEOUS | Status: DC
  Administered 2015-12-29: 2 [IU] via SUBCUTANEOUS
  Administered 2015-12-30: 1 [IU] via SUBCUTANEOUS
  Administered 2015-12-30: 2 [IU] via SUBCUTANEOUS

## 2015-12-29 MED ORDER — INSULIN GLARGINE 100 UNIT/ML ~~LOC~~ SOLN
20.0000 [IU] | Freq: Every day | SUBCUTANEOUS | Status: DC
  Administered 2015-12-29 – 2016-01-03 (×6): 20 [IU] via SUBCUTANEOUS
  Filled 2015-12-29 (×7): qty 0.2

## 2015-12-29 MED ORDER — ALBUTEROL SULFATE (2.5 MG/3ML) 0.083% IN NEBU
INHALATION_SOLUTION | RESPIRATORY_TRACT | Status: AC
Start: 2015-12-29 — End: 2015-12-30
  Filled 2015-12-29: qty 3

## 2015-12-29 MED ORDER — WARFARIN - PHARMACIST DOSING INPATIENT
Freq: Every day | Status: DC
  Administered 2016-01-03: 18:00:00

## 2015-12-29 MED ORDER — DILTIAZEM HCL 100 MG IV SOLR: 5.0000 mg/h | INTRAVENOUS | Status: DC

## 2015-12-29 MED ORDER — METOPROLOL TARTRATE 1 MG/ML IV SOLN: 5.0000 mg | INTRAVENOUS | Status: DC | PRN

## 2015-12-29 MED ORDER — FUROSEMIDE 10 MG/ML IJ SOLN
60.0000 mg | Freq: Once | INTRAMUSCULAR | Status: AC
  Administered 2015-12-29: 60 mg via INTRAVENOUS
  Filled 2015-12-29: qty 6

## 2015-12-29 MED ORDER — POTASSIUM CHLORIDE CRYS ER 20 MEQ PO TBCR
20.0000 meq | EXTENDED_RELEASE_TABLET | Freq: Every day | ORAL | Status: DC
  Administered 2015-12-30 – 2016-01-04 (×6): 20 meq via ORAL
  Filled 2015-12-29 (×6): qty 1

## 2015-12-29 MED ORDER — AZITHROMYCIN 500 MG PO TABS
500.0000 mg | ORAL_TABLET | Freq: Every day | ORAL | Status: DC
  Administered 2015-12-29 – 2016-01-01 (×4): 500 mg via ORAL
  Filled 2015-12-29 (×3): qty 1
  Filled 2015-12-29: qty 2

## 2015-12-29 MED ORDER — GUAIFENESIN-DM 100-10 MG/5ML PO SYRP
5.0000 mL | ORAL_SOLUTION | ORAL | Status: DC | PRN
Start: 2015-12-29 — End: 2016-01-04
  Administered 2015-12-29 – 2016-01-04 (×17): 5 mL via ORAL
  Filled 2015-12-29 (×17): qty 5

## 2015-12-29 MED ORDER — VITAMIN D (ERGOCALCIFEROL) 1.25 MG (50000 UNIT) PO CAPS
50000.0000 [IU] | ORAL_CAPSULE | ORAL | Status: DC
Start: 2015-12-31 — End: 2016-01-04
  Administered 2015-12-31: 50000 [IU] via ORAL
  Filled 2015-12-29: qty 1

## 2015-12-29 MED ORDER — ONDANSETRON HCL 4 MG PO TABS: 4.0000 mg | ORAL_TABLET | Freq: Four times a day (QID) | ORAL | Status: DC | PRN

## 2015-12-29 MED ORDER — ESMOLOL HCL-SODIUM CHLORIDE 2000 MG/100ML IV SOLN
25.0000 ug/kg/min | INTRAVENOUS | Status: DC
  Administered 2015-12-29 – 2015-12-30 (×2): 25 ug/kg/min via INTRAVENOUS
  Filled 2015-12-29 (×2): qty 100

## 2015-12-29 MED ORDER — ALLOPURINOL 100 MG PO TABS
100.0000 mg | ORAL_TABLET | Freq: Two times a day (BID) | ORAL | Status: DC
  Administered 2015-12-29 – 2016-01-04 (×12): 100 mg via ORAL
  Filled 2015-12-29 (×12): qty 1

## 2015-12-29 MED ORDER — DILTIAZEM LOAD VIA INFUSION: 10.0000 mg | Freq: Once | INTRAVENOUS | Status: DC

## 2015-12-29 MED ORDER — HYDRALAZINE HCL 25 MG PO TABS
25.0000 mg | ORAL_TABLET | Freq: Three times a day (TID) | ORAL | Status: DC
  Administered 2015-12-29 – 2016-01-04 (×17): 25 mg via ORAL
  Filled 2015-12-29 (×18): qty 1

## 2015-12-29 MED ORDER — DILTIAZEM LOAD VIA INFUSION
10.0000 mg | Freq: Once | INTRAVENOUS | Status: DC
  Filled 2015-12-29: qty 10

## 2015-12-29 MED ORDER — WARFARIN SODIUM 5 MG PO TABS
7.5000 mg | ORAL_TABLET | Freq: Once | ORAL | Status: AC
  Administered 2015-12-29: 7.5 mg via ORAL
  Filled 2015-12-29: qty 1.5
  Filled 2015-12-29: qty 1

## 2015-12-29 MED ORDER — INSULIN ASPART 100 UNIT/ML ~~LOC~~ SOLN
0.0000 [IU] | Freq: Three times a day (TID) | SUBCUTANEOUS | Status: DC
  Administered 2015-12-30: 1 [IU] via SUBCUTANEOUS
  Administered 2016-01-01 – 2016-01-02 (×2): 2 [IU] via SUBCUTANEOUS
  Administered 2016-01-03: 1 [IU] via SUBCUTANEOUS
  Administered 2016-01-04: 2 [IU] via SUBCUTANEOUS

## 2015-12-29 MED ORDER — ISOSORBIDE MONONITRATE ER 30 MG PO TB24: 15.0000 mg | ORAL_TABLET | Freq: Every day | ORAL | Status: DC

## 2015-12-29 MED ORDER — HYDROCODONE-ACETAMINOPHEN 5-325 MG PO TABS
1.0000 | ORAL_TABLET | ORAL | Status: DC | PRN
  Administered 2015-12-29: 2 via ORAL
  Filled 2015-12-29: qty 2

## 2015-12-29 MED ORDER — ISOSORBIDE MONONITRATE ER 30 MG PO TB24
30.0000 mg | ORAL_TABLET | Freq: Every day | ORAL | Status: DC
  Administered 2015-12-29 – 2015-12-30 (×2): 30 mg via ORAL
  Filled 2015-12-29 (×2): qty 1

## 2015-12-29 MED ORDER — ALBUTEROL SULFATE (2.5 MG/3ML) 0.083% IN NEBU
5.0000 mg | INHALATION_SOLUTION | Freq: Once | RESPIRATORY_TRACT | Status: AC
  Administered 2015-12-29: 5 mg via RESPIRATORY_TRACT
  Filled 2015-12-29: qty 6

## 2015-12-29 MED ORDER — HEPARIN BOLUS VIA INFUSION
4500.0000 [IU] | Freq: Once | INTRAVENOUS | Status: AC
  Administered 2015-12-29: 4500 [IU] via INTRAVENOUS
  Filled 2015-12-29: qty 4500

## 2015-12-29 MED ORDER — ACETAMINOPHEN 325 MG PO TABS
650.0000 mg | ORAL_TABLET | Freq: Four times a day (QID) | ORAL | Status: DC | PRN
  Administered 2015-12-30: 650 mg via ORAL
  Filled 2015-12-29: qty 2

## 2015-12-29 NOTE — ED Notes (Signed)
Attempted report x1. 

## 2015-12-29 NOTE — H&P (Signed)
Patient Demographics:    Michael [name], is a 55 y.o. male  MRN: [id number]   DOB - April 22, 1961  Admit Date - 12/29/2015  Outpatient Primary MD for the patient is No PCP Per Patient   With History of -  Past Medical History  Diagnosis Date  . Coronary artery disease   . Diabetes mellitus without complication ([place])   . Hypertension   . Chronic systolic CHF (congestive heart failure) (HCC)     a. prior EF normal; b. Echo 10/16: Mild LVH, EF 30-35%, mitral valve bioprosthesis present without evidence of stenosis or regurgitation, severe LAE, mild RVE with mild to moderately reduced RVSF, moderate RAE  . 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 H/O tricuspid valve repair 2012  . Paroxysmal atrial fibrillation (HCC)     s/p Maze procedure at time of MVR in 2011  . Diabetes mellitus type 2 in obese ([place])   . Obstructive sleep apnea   . Chronic kidney disease (CKD), stage IV (severe) ([place])   . Anemia of chronic disease   . Pulmonary HTN ([place])   . History of echocardiogram     Echo at [facility] 4/12: mod LVH, EF 60-65%, mod LAE, tissue MVR ok, mod TR, mod pulmo HTN with RVSP 48 mmHg  . Asthma   . GERD (gastroesophageal reflux disease)       Past Surgical History  Procedure Laterality Date  . Cardiac surgery      in for   Chief Complaint  Patient presents with  . Shortness of Breath      HPI:    Michael [name]  is a 55 y.o. male,  with history of chronic atrial fibrillation [place] vasc 2 score of 4 above on Coumadin, chronic systolic heart failure EF 30-35%, type 2 diabetes mellitus now insulin-dependent, obstructive sleep apnea, CK D stage IV baseline creatinine close to 2, history of mitral valve replacement he has a bioprosthetic valve, history of Maze procedure in 2011  which was not successful, GERD, asthma who comes in with 2 day history of shortness of breath and orthopnea, according to the patient his shortness of breath has been gradually progressive, worse on laying flat and exertion better with rest and sitting up, he has also noticed a dry frothy phlegm, does have a cough, denies any fever or chills, no body aches, no exposure to sick contacts, no chest pain or palpitations.  The ER workup was consistent with atrial fibrillation with RVR, acute hypoxic respiratory failure due to acute on chronic systolic CHF and possible URI and I was called to admit the patient.    Review of systems:    In addition to the HPI above,   No Fever-chills, No Headache, No changes with Vision or hearing, No problems swallowing food or Liquids, No Chest pain, positive cough and Shortness of Breath as above, No Abdominal pain, No Nausea or Vommitting,  Bowel movements are regular, No Blood in stool or Urine, No dysuria, No new skin rashes or bruises, No new joints pains-aches,  No new weakness, tingling, numbness in any extremity, No recent weight gain or loss, No polyuria, polydypsia or polyphagia, No significant Mental Stressors.  A full 10 point Review of Systems was done, except as stated above, all other Review of Systems were negative.    Social History:     Social History  Substance Use Topics  . Smoking status: Current Some Day Smoker  . Smokeless tobacco: Not on file  . Alcohol Use: No    Lives - at home and mobile - active, walks without assistance      Family History :     Family History  Problem Relation Age of Onset  . Heart attack Neg Hx        Home Medications:   Prior to Admission medications   Medication Sig Start Date End Date Taking? Authorizing Provider  acetaminophen (TYLENOL) 325 MG tablet Take 650 mg by mouth every 6 (six) hours as needed for headache (pain).    Historical Provider, MD  allopurinol (ZYLOPRIM) 100 MG tablet  Take 1 tablet (100 mg total) by mouth 2 (two) times daily. 10/23/15   [name], MD  Blood Glucose Monitoring Suppl (ACCU-CHEK AVIVA) device Use as Instructed 3 times daily 10/22/15   [name], MD  digoxin (LANOXIN) 0.125 MG tablet Take 1 tablet (0.125 mg total) by mouth daily. 10/17/15   Brett Canales, PA-C  furosemide (LASIX) 40 MG tablet Take 2 tablets (80 mg total) by mouth 2 (two) times daily. 12/26/15   Liliane Shi, PA-C  glucose blood (ACCU-CHEK AVIVA) test strip Use 3 times daily as directed. 10/22/15   [name], MD  hydrALAZINE (APRESOLINE) 10 MG tablet Take 1 tablet (10 mg total) by mouth 3 (three) times daily. 10/17/15   Brett Canales, PA-C  insulin glargine (LANTUS) 100 UNIT/ML injection Inject 0.2 mLs (20 Units total) into the skin at bedtime. 10/22/15   [name], MD  isosorbide mononitrate (IMDUR) 30 MG 24 hr tablet Take 0.5 tablets (15 mg total) by mouth daily. 10/24/15   Liliane Shi, PA-C  Lancet Devices [facility]) lancets Use as instructed 3 times daily 10/22/15   [name], MD  metoprolol succinate (TOPROL-XL) 100 MG 24 hr tablet Take 1 tablet (100 mg total) by mouth as directed. take 1 and 1/2 tablets twice daily 12/07/15   Liliane Shi, PA-C  naproxen (NAPROSYN) 500 MG tablet Take 500 mg by mouth 2 (two) times daily with a meal.    Historical Provider, MD  potassium chloride SA (K-DUR,KLOR-CON) 20 MEQ tablet Take 1 tablet (20 mEq total) by mouth daily. 10/24/15   Liliane Shi, PA-C  Vitamin D, Ergocalciferol, (DRISDOL) 50000 UNITS CAPS capsule Take 50,000 Units by mouth every 7 (seven) days.    Historical Provider, MD  warfarin (COUMADIN) 5 MG tablet Take 1 tablet (5 mg total) by mouth daily. 09/15/15   Albertine Patricia, MD     Allergies:    No Known Allergies   Physical Exam:   Vitals  Blood pressure 128/88, pulse 91, temperature 98.1 F (36.7 C), temperature source Oral, resp. rate 20, weight 92.488 kg (203 lb 14.4 oz), SpO2 97 %.   1.  General middle-aged Asian male lying in bed in sitting up in hospital bed in mild respiratory distress,  2. Normal affect and insight, Not Suicidal or Homicidal,  Awake Alert, Oriented X 3.  3. No F.N deficits, ALL C.Nerves Intact, Strength 5/5 all 4 extremities, Sensation intact all 4 extremities, Plantars down going.  4. Ears and Eyes appear Normal, Conjunctivae clear, PERRLA. Moist Oral Mucosa.  5. Supple Neck, ++ JVD, No cervical lymphadenopathy appriciated, No Carotid Bruits.  6. Symmetrical Chest wall movement, Good air movement bilaterally, bilateral rales  7. Irregular RRR, No Gallops, Rubs or Murmurs, No Parasternal Heave.  8. Positive Bowel Sounds, Abdomen Soft, No tenderness, No organomegaly appriciated,No rebound -guarding or rigidity.  9.  No Cyanosis, Normal Skin Turgor, No Skin Rash or Bruise.  10. Good muscle tone,  joints appear normal , no effusions, Normal ROM.  11. No Palpable Lymph Nodes in Neck or Axillae      Data Review:    CBC  Recent Labs Lab 12/29/15 1436  WBC 6.0  HGB 10.4*  HCT 33.3*  PLT 326  MCV 68.5*  MCH 21.4*  MCHC 31.2  RDW 18.8*   ------------------------------------------------------------------------------------------------------------------  Chemistries   Recent Labs Lab 12/26/15 1705 12/29/15 1436  NA 135 140  K 4.4 4.9  CL 102 104  CO2 24 24  GLUCOSE 116* 171*  BUN 60* 61*  CREATININE 2.19* 2.08*  CALCIUM 9.3 9.0   ------------------------------------------------------------------------------------------------------------------ estimated creatinine clearance is 40.7 mL/min (by C-G formula based on Cr of 2.08). ------------------------------------------------------------------------------------------------------------------ No results for input(s): TSH, T4TOTAL, T3FREE, THYROIDAB in the last 72 hours.  Invalid input(s): FREET3   Coagulation profile  Recent Labs Lab 12/26/15 1628 12/29/15 1436  INR 1.4 1.52*     ------------------------------------------------------------------------------------------------------------------- No results for input(s): DDIMER in the last 72 hours. -------------------------------------------------------------------------------------------------------------------  Cardiac Enzymes No results for input(s): CKMB, TROPONINI, MYOGLOBIN in the last 168 hours.  Invalid input(s): CK ------------------------------------------------------------------------------------------------------------------    Component Value Date/Time   BNP 1453.8* 12/29/2015 1436     ---------------------------------------------------------------------------------------------------------------  Urinalysis No results found for: COLORURINE, APPEARANCEUR, LABSPEC, PHURINE, GLUCOSEU, HGBUR, BILIRUBINUR, KETONESUR, PROTEINUR, UROBILINOGEN, NITRITE, LEUKOCYTESUR  ----------------------------------------------------------------------------------------------------------------   Imaging Results:    Dg Chest 2 View  12/29/2015  CLINICAL DATA:  Three-day history of shortness of breath. Cough and wheezing EXAM: CHEST  2 VIEW COMPARISON:  October 10, 2015 FINDINGS: There is no edema or consolidation. There is mild cardiomegaly with pulmonary vascularity within normal limits. No adenopathy. Patient is status post coronary artery bypass grafting. No bone lesions are apparent. IMPRESSION: Stable cardiac enlargement.  No edema or consolidation. Electronically Signed   By: Lowella Grip III M.D.   On: 12/29/2015 15:47    My personal review of EKG: Rhythm Afib, Rate  115 /min, non specific ST changes   Assessment & Plan:     1. Acute hypoxic respiratory failure due to acute on chronic systolic CHF EF [id number] and a patient with dilated cardiomyopathy. Will be admitted to stepdown bed, IV Lasix, fluid and salt restriction, continue beta blocker, not on ACE/ARB due to CK D stage4. Will apply Nitropaste and  continue hydralazine, will monitor intake and output and daily weight. Oxygen and nebulizer treatments as needed.  2. Positive productive cough with frothy phlegm. Likely due to #1 above. URI cannot be ruled out, will order sputum Gram stain and culture and place on azithromycin for now. Supportive care as above.   3. CK D stage IV. Creatinine appears to be at baseline of around 2. Will monitor with diuresis.   4. History of mitral valve repair. Has bioprosthetic mitral valve. Continue supportive care and no acute issues.   5.  Chronic atrial fibrillation and RVR. Check asked to score of greater than 4. Currently will be placed on IV asthma in all drip, will continue his home dose digoxin and oral beta blocker, once heart rate stays below 100 for more than 2 hours will try to titrate off of drip. Continue Coumadin under the guidance of pharmacy, since INR is subtherapeutic with relatively high [place] vasc 2 score will bridge with heparin drip.   6. DM type II. Check A1c, continue Lantus. Place on sliding scale and monitor.    DVT Prophylaxis Heparin/Coumadin  AM Labs Ordered, also please review Full Orders  Family Communication: Admission, patients condition and plan of care including tests being ordered have been discussed with the patient  who indicates understanding and agree with the plan and Code Status.  Code Status  Full  Likely DC to  Home 2-3 days  Condition GUARDED     Time spent in minutes : 35    Lala Lund K M.D on 12/29/2015 at 6:42 PM  Between 7am to 7pm - Pager - [phone]  After 7pm go to [email or web address] - password [facility]  [facility] - Office  [phone]

## 2015-12-29 NOTE — ED Provider Notes (Signed)
CSN: [id number]     Arrival date & time 12/29/15  1428 History   First MD Initiated Contact with Patient 12/29/15 1614     Chief Complaint  Patient presents with  . Shortness of Breath     (Consider location/radiation/quality/duration/timing/severity/associated sxs/prior Treatment) Patient is a 56 y.o. male presenting with shortness of breath. The history is provided by the patient.  Shortness of Breath Associated symptoms: cough and wheezing   Associated symptoms: no abdominal pain, no chest pain, no headaches, no rash and no vomiting    Patient presents with shortness of breath. He has had a cough and maybe some fevers. No chest pain. Has a history of asthma. Also history of CHF. States that his legs are more swollen. He was seen by his cardiologist 4 days ago increased his Lasix. States the breathing is not improved. No nausea vomiting. He has had wheezing. No sick contacts. Past Medical History  Diagnosis Date  . Coronary artery disease   . Diabetes mellitus without complication ([place])   . Hypertension   . Chronic systolic CHF (congestive heart failure) (HCC)     a. prior EF normal; b. Echo 10/16: Mild LVH, EF 30-35%, mitral valve bioprosthesis present without evidence of stenosis or regurgitation, severe LAE, mild RVE with mild to moderately reduced RVSF, moderate RAE  . 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 H/O tricuspid valve repair 2012  . Paroxysmal atrial fibrillation (HCC)     s/p Maze procedure at time of MVR in 2011  . Diabetes mellitus type 2 in obese ([place])   . Obstructive sleep apnea   . Chronic kidney disease (CKD), stage IV (severe) ([place])   . Anemia of chronic disease   . Pulmonary HTN ([place])   . History of echocardiogram     Echo at [facility] in [facility] 4/12: mod LVH, EF 60-65%, mod LAE, tissue MVR ok, mod TR, mod pulmo HTN with RVSP 48 mmHg  . Asthma   . GERD (gastroesophageal reflux disease)    Past Surgical History  Procedure Laterality Date  .  Cardiac surgery     Family History  Problem Relation Age of Onset  . Heart attack Neg Hx    Social History  Substance Use Topics  . Smoking status: Current Some Day Smoker  . Smokeless tobacco: None  . Alcohol Use: No    Review of Systems  Constitutional: Positive for fatigue. Negative for activity change and appetite change.  Eyes: Negative for pain.  Respiratory: Positive for cough, shortness of breath and wheezing. Negative for chest tightness.   Cardiovascular: Positive for leg swelling. Negative for chest pain.  Gastrointestinal: Negative for nausea, vomiting, abdominal pain and diarrhea.  Genitourinary: Negative for flank pain.  Musculoskeletal: Negative for back pain and neck stiffness.  Skin: Negative for rash.  Neurological: Negative for weakness, numbness and headaches.  Psychiatric/Behavioral: Negative for behavioral problems.      Allergies  Review of patient's allergies indicates no known allergies.  Home Medications   Prior to Admission medications   Medication Sig Start Date End Date Taking? Authorizing Provider  allopurinol (ZYLOPRIM) 100 MG tablet Take 1 tablet (100 mg total) by mouth 2 (two) times daily. 10/23/15  Yes [name], MD  furosemide (LASIX) 80 MG tablet Take 80 mg by mouth 2 (two) times daily.   Yes Historical Provider, MD  isosorbide mononitrate (IMDUR) 30 MG 24 hr tablet Take 0.5 tablets (15 mg total) by mouth daily. Patient taking differently: Take 15  mg by mouth daily with supper.  10/24/15  Yes Scott Joylene Draft, PA-C  metoprolol (LOPRESSOR) 100 MG tablet Take 100 mg by mouth 2 (two) times daily.   Yes Historical Provider, MD  potassium chloride SA (K-DUR,KLOR-CON) 20 MEQ tablet Take 1 tablet (20 mEq total) by mouth daily. 10/24/15  Yes Scott Joylene Draft, PA-C  acetaminophen (TYLENOL) 325 MG tablet Take 650 mg by mouth every 6 (six) hours as needed for headache (pain).    Historical Provider, MD  Blood Glucose Monitoring Suppl (ACCU-CHEK AVIVA)  device Use as Instructed 3 times daily 10/22/15   [name], MD  digoxin (LANOXIN) 0.125 MG tablet Take 1 tablet (0.125 mg total) by mouth daily. 10/17/15   Brett Canales, PA-C  furosemide (LASIX) 40 MG tablet Take 2 tablets (80 mg total) by mouth 2 (two) times daily. Patient not taking: Reported on 12/29/2015 12/26/15   Liliane Shi, PA-C  glucose blood (ACCU-CHEK AVIVA) test strip Use 3 times daily as directed. 10/22/15   [name], MD  hydrALAZINE (APRESOLINE) 10 MG tablet Take 1 tablet (10 mg total) by mouth 3 (three) times daily. 10/17/15   Brett Canales, PA-C  insulin glargine (LANTUS) 100 UNIT/ML injection Inject 0.2 mLs (20 Units total) into the skin at bedtime. 10/22/15   [name], MD  Lancet Devices [facility]) lancets Use as instructed 3 times daily 10/22/15   [name], MD  metoprolol succinate (TOPROL-XL) 100 MG 24 hr tablet Take 1 tablet (100 mg total) by mouth as directed. take 1 and 1/2 tablets twice daily Patient not taking: Reported on 12/29/2015 12/07/15   Liliane Shi, PA-C  naproxen (NAPROSYN) 500 MG tablet Take 500 mg by mouth 2 (two) times daily with a meal.    Historical Provider, MD  Vitamin D, Ergocalciferol, (DRISDOL) 50000 UNITS CAPS capsule Take 50,000 Units by mouth every 7 (seven) days.    Historical Provider, MD  warfarin (COUMADIN) 5 MG tablet Take 1 tablet (5 mg total) by mouth daily. Patient taking differently: Take 5-7.5 mg by mouth at bedtime. Pre anti-coag visit 12/07/15: take 1 1/2 tablets (7.5 mg) by mouth on Monday and Thursday, take 1 tablet (5 mg) on Sunday, Tuesday, Wednesday, Friday, Saturday 09/15/15   Silver Huguenin Elgergawy, MD   BP 100/82 mmHg  Pulse 63  Temp(Src) 97.8 F (36.6 C) (Oral)  Resp 27  Ht 5\' 4"  (1.626 m)  Wt 199 lb 15.3 oz (90.7 kg)  BMI 34.31 kg/m2  SpO2 98% Physical Exam  Constitutional: He appears well-developed.  HENT:  Head: Atraumatic.  Eyes: EOM are normal.  Cardiovascular:  Mild tachycardia   Pulmonary/Chest: He has wheezes.  Diffuse wheezes and prolonged expirations.  Abdominal: Soft. There is no tenderness.  Musculoskeletal: He exhibits edema.  Mild edema to bilateral lower legs.  Skin: Skin is warm.    ED Course  Procedures (including critical care time) Labs Review Labs Reviewed  BASIC METABOLIC PANEL - Abnormal; Notable for the following:    Glucose, Bld 171 (*)    BUN 61 (*)    Creatinine, Ser 2.08 (*)    GFR calc non Af Amer 34 (*)    GFR calc Af Amer 39 (*)    All other components within normal limits  CBC - Abnormal; Notable for the following:    Hemoglobin 10.4 (*)    HCT 33.3 (*)    MCV 68.5 (*)    MCH 21.4 (*)    RDW 18.8 (*)  All other components within normal limits  BRAIN NATRIURETIC PEPTIDE - Abnormal; Notable for the following:    B Natriuretic Peptide 1453.8 (*)    All other components within normal limits  DIGOXIN LEVEL - Abnormal; Notable for the following:    Digoxin Level <0.2 (*)    All other components within normal limits  PROTIME-INR - Abnormal; Notable for the following:    Prothrombin Time 18.3 (*)    INR 1.52 (*)    All other components within normal limits  GLUCOSE, CAPILLARY - Abnormal; Notable for the following:    Glucose-Capillary 226 (*)    All other components within normal limits  CULTURE, EXPECTORATED SPUTUM-ASSESSMENT  MRSA PCR SCREENING  MRSA PCR SCREENING  HEPARIN LEVEL (UNFRACTIONATED)  CBC  BASIC METABOLIC PANEL  HEMOGLOBIN A1C  I-STAT TROPOININ, ED    Imaging Review Dg Chest 2 View  12/29/2015  CLINICAL DATA:  Three-day history of shortness of breath. Cough and wheezing EXAM: CHEST  2 VIEW COMPARISON:  October 10, 2015 FINDINGS: There is no edema or consolidation. There is mild cardiomegaly with pulmonary vascularity within normal limits. No adenopathy. Patient is status post coronary artery bypass grafting. No bone lesions are apparent. IMPRESSION: Stable cardiac enlargement.  No edema or consolidation.  Electronically Signed   By: Lowella Grip III M.D.   On: 12/29/2015 15:47   I have personally reviewed and evaluated these images and lab results as part of my medical decision-making.   EKG Interpretation   Date/Time:  Saturday December 29 2015 14:36:19 EST Ventricular Rate:  108 PR Interval:    QRS Duration: 100 QT Interval:  372 QTC Calculation: 498 R Axis:   -53 Text Interpretation:  Atrial fibrillation with rapid ventricular response  Left axis deviation ST \\T \ T wave abnormality, consider lateral ischemia  Abnormal ECG Confirmed by Hazle Coca [phone]) on 12/29/2015 3:18:18 PM      MDM   Final diagnoses:  Acute hypoxemic respiratory failure (HCC)  Asthma, unspecified asthma severity, uncomplicated  Chronic atrial fibrillation ([place])    Patient with shortness of breath. Has history of A. fib CHF asthma. Seems more tight than what. Continue tachycardia with the A. fib. A. fib could be related to pulmonary cause. BNP is up but x-ray did not show pulmonary edema. Admit to internal medicine.    Davonna Belling, MD 12/30/15 0000

## 2015-12-29 NOTE — ED Notes (Signed)
Pt here for SOB x 3 days, coughing and wheezing. Denies any chest pain. Pt audible wheezing.

## 2015-12-29 NOTE — Progress Notes (Signed)
ANTICOAGULATION CONSULT NOTE - Initial Consult  Pharmacy Consult for warfarin, heparin Indication: atrial fibrillation  No Known Allergies  Patient Measurements: Weight: 203 lb 14.4 oz (92.488 kg) Heparin Dosing Weight: 80.2 kg  Vital Signs: Temp: 98.1 F (36.7 C) (02/11 1447) Temp Source: Oral (02/11 1447) BP: 117/88 mmHg (02/11 1845) Pulse Rate: 94 (02/11 1845)  Labs:  Recent Labs  12/29/15 1436  HGB 10.4*  HCT 33.3*  PLT 326  LABPROT 18.3*  INR 1.52*  CREATININE 2.08*    Estimated Creatinine Clearance: 40.7 mL/min (by C-G formula based on Cr of 2.08).  Assessment: 55 yo male with 2 day hx of SOB, orthopnea - afib with RVR  PMH: AFib on warfarin, sHF EF 30-35%, T2DM, OSA, CKDIV, Hx of MVR with bioprosthetic valve, Hx of failed Maze, GERD  AC: on warfarin PTA - to bridge with heparin for low INR on admit - INR 1.52  Renal: SCr 2.08  Heme: H&H 10.4/33.3, Plt 326  Goal of Therapy:  INR 2-3 Heparin level 0.3-0.7 units/ml Monitor platelets by anticoagulation protocol: Yes   Plan:  Heparin 4500 unit bolus  Heparin infusion 1200 units/hr Warfarin 7.5 mg x 1 0200 HL Daily HL, CBC Monitor for s/sx of bleeding  Levester Fresh, PharmD, BCPS, [facility] Clinical Pharmacist Pager [phone] 12/29/2015 6:56 PM

## 2015-12-30 DIAGNOSIS — N184 Chronic kidney disease, stage 4 (severe): Secondary | ICD-10-CM

## 2015-12-30 DIAGNOSIS — I5023 Acute on chronic systolic (congestive) heart failure: Secondary | ICD-10-CM

## 2015-12-30 DIAGNOSIS — J9601 Acute respiratory failure with hypoxia: Secondary | ICD-10-CM

## 2015-12-30 DIAGNOSIS — I482 Chronic atrial fibrillation: Secondary | ICD-10-CM

## 2015-12-30 LAB — GLUCOSE, CAPILLARY
GLUCOSE-CAPILLARY: 141 mg/dL — AB (ref 65–99)
Glucose-Capillary: 90 mg/dL (ref 65–99)
Glucose-Capillary: 114 mg/dL — ABNORMAL HIGH (ref 65–99)
Glucose-Capillary: 127 mg/dL — ABNORMAL HIGH (ref 65–99)

## 2015-12-30 LAB — BASIC METABOLIC PANEL
ANION GAP: 8 (ref 5–15)
BUN: 61 mg/dL — ABNORMAL HIGH (ref 6–20)
CALCIUM: 8.8 mg/dL — AB (ref 8.9–10.3)
CO2: 27 mmol/L (ref 22–32)
Chloride: 105 mmol/L (ref 101–111)
Creatinine, Ser: 2.28 mg/dL — ABNORMAL HIGH (ref 0.61–1.24)
GFR, EST AFRICAN AMERICAN: 35 mL/min — AB (ref >60)
GFR, EST NON AFRICAN AMERICAN: 30 mL/min — AB (ref >60)
GLUCOSE: 109 mg/dL — AB (ref 65–99)
Potassium: 4.2 mmol/L (ref 3.5–5.1)
SODIUM: 140 mmol/L (ref 135–145)

## 2015-12-30 LAB — PROTIME-INR
INR: 1.48 (ref 0.00–1.49)
PROTHROMBIN TIME: 18 s — AB (ref 11.6–15.2)

## 2015-12-30 LAB — CBC
HCT: 31.2 % — ABNORMAL LOW (ref 39.0–52.0)
HEMOGLOBIN: 9.6 g/dL — AB (ref 13.0–17.0)
MCH: 21 pg — AB (ref 26.0–34.0)
MCHC: 30.8 g/dL (ref 30.0–36.0)
MCV: 68.3 fL — AB (ref 78.0–100.0)
Platelets: 303 10*3/uL (ref 150–400)
RBC: 4.57 MIL/uL (ref 4.22–5.81)
RDW: 18.8 % — ABNORMAL HIGH (ref 11.5–15.5)
WBC: 6.6 10*3/uL (ref 4.0–10.5)

## 2015-12-30 LAB — HEPARIN LEVEL (UNFRACTIONATED)
Heparin Unfractionated: 0.26 IU/mL — ABNORMAL LOW (ref 0.30–0.70)
Heparin Unfractionated: 0.41 IU/mL (ref 0.30–0.70)

## 2015-12-30 LAB — MRSA PCR SCREENING: MRSA by PCR: NEGATIVE

## 2015-12-30 MED ORDER — HYDROCODONE-ACETAMINOPHEN 5-325 MG PO TABS
1.0000 | ORAL_TABLET | ORAL | Status: DC | PRN
  Administered 2015-12-30 – 2016-01-04 (×10): 1 via ORAL
  Filled 2015-12-30 (×10): qty 1

## 2015-12-30 MED ORDER — METOLAZONE 2.5 MG PO TABS
2.5000 mg | ORAL_TABLET | Freq: Once | ORAL | Status: AC
  Administered 2015-12-30: 2.5 mg via ORAL
  Filled 2015-12-30: qty 1

## 2015-12-30 MED ORDER — DIGOXIN 125 MCG PO TABS
0.1250 mg | ORAL_TABLET | Freq: Every day | ORAL | Status: DC
  Administered 2015-12-30 – 2016-01-04 (×6): 0.125 mg via ORAL
  Filled 2015-12-30 (×6): qty 1

## 2015-12-30 MED ORDER — METOPROLOL TARTRATE 50 MG PO TABS: 75.0000 mg | ORAL_TABLET | Freq: Two times a day (BID) | ORAL | Status: DC

## 2015-12-30 MED ORDER — FUROSEMIDE 10 MG/ML IJ SOLN
80.0000 mg | Freq: Two times a day (BID) | INTRAMUSCULAR | Status: DC
  Administered 2015-12-30: 80 mg via INTRAVENOUS
  Filled 2015-12-30: qty 8

## 2015-12-30 MED ORDER — METOPROLOL TARTRATE 50 MG PO TABS
75.0000 mg | ORAL_TABLET | Freq: Two times a day (BID) | ORAL | Status: DC
  Administered 2015-12-30 – 2015-12-31 (×3): 75 mg via ORAL
  Filled 2015-12-30 (×3): qty 1

## 2015-12-30 MED ORDER — DIGOXIN 0.25 MG/ML IJ SOLN
0.1250 mg | Freq: Once | INTRAMUSCULAR | Status: AC
  Administered 2015-12-30: 0.125 mg via INTRAVENOUS
  Filled 2015-12-30: qty 2

## 2015-12-30 MED ORDER — CETYLPYRIDINIUM CHLORIDE 0.05 % MT LIQD
7.0000 mL | Freq: Two times a day (BID) | OROMUCOSAL | Status: DC
  Administered 2015-12-31 – 2016-01-04 (×9): 7 mL via OROMUCOSAL

## 2015-12-30 MED ORDER — SALINE SPRAY 0.65 % NA SOLN
1.0000 | NASAL | Status: DC | PRN
  Administered 2015-12-30: 1 via NASAL
  Filled 2015-12-30: qty 44

## 2015-12-30 MED ORDER — FUROSEMIDE 10 MG/ML IJ SOLN
20.0000 mg | Freq: Once | INTRAMUSCULAR | Status: AC
  Administered 2015-12-30: 20 mg via INTRAVENOUS
  Filled 2015-12-30: qty 2

## 2015-12-30 MED ORDER — WARFARIN SODIUM 5 MG PO TABS
7.5000 mg | ORAL_TABLET | Freq: Once | ORAL | Status: AC
  Administered 2015-12-30: 7.5 mg via ORAL
  Filled 2015-12-30: qty 1.5

## 2015-12-30 MED ORDER — ISOSORBIDE MONONITRATE ER 30 MG PO TB24
15.0000 mg | ORAL_TABLET | Freq: Every day | ORAL | Status: DC
  Administered 2015-12-31 – 2016-01-04 (×5): 15 mg via ORAL
  Filled 2015-12-30 (×5): qty 1

## 2015-12-30 NOTE — Progress Notes (Signed)
Utilization review completed.  

## 2015-12-30 NOTE — Progress Notes (Signed)
Pharmacist Heart Failure Core Measure Documentation  Assessment: Michael [name] has an EF documented as 30-35% on 09/11/15 by echo.  Rationale: Heart failure patients with left ventricular systolic dysfunction (LVSD) and an EF < 40% should be prescribed an angiotensin converting enzyme inhibitor (ACEI) or angiotensin receptor blocker (ARB) at discharge unless a contraindication is documented in the medical record.  This patient is not currently on an ACEI or ARB for HF.  This note is being placed in the record in order to provide documentation that a contraindication to the use of these agents is present for this encounter.  ACE Inhibitor or Angiotensin Receptor Blocker is contraindicated (specify all that apply)  []   ACEI allergy AND ARB allergy []   Angioedema []   Moderate or severe aortic stenosis []   Hyperkalemia []   Hypotension []   Renal artery stenosis [x]   Worsening renal function, preexisting renal disease or dysfunction  [name], Pharm.D. [phone] 12/30/2015 3:21 PM

## 2015-12-30 NOTE — Consult Note (Signed)
CARDIOLOGY CONSULT NOTE     Referring Physician:  Dr Merita Norton  Admit Date: 12/29/2015  Reason for consultation:  CHF  Michael [name] is a 55 y.o. male with a h/o mitral valve disease status post bioprosthetic mitral valve replacement in 2012 at [facility] in [place], tricuspid valve repair, ?CAD s/p CABG (notes from hospital are conflicting - patient denies hx of CABG, records from [facility] do not mention CABG), chronic systolic dysfunction (EF [id number]), permanent atrial fibrillation, CKD, OSA, diabetes, HTN, HL, and pulmonary hypertension.  He is followed by Dr Harrington Challenger with [facility].  He reports developing respiratory symptoms of cough and SOB with wheezing and fevers for several days.  He has noticed some associated orthopnea.  Today, he denies symptoms of palpitations, chest pain,  dizziness, presyncope, syncope, or neurologic sequela. The patient is tolerating medications without difficulties and is otherwise without complaint today.   Past Medical History  Diagnosis Date  . Coronary artery disease   . Diabetes mellitus without complication ([place])   . Hypertension   . Chronic systolic CHF (congestive heart failure) (HCC)     a. prior EF normal; b. Echo 10/16: Mild LVH, EF 30-35%, mitral valve bioprosthesis present without evidence of stenosis or regurgitation, severe LAE, mild RVE with mild to moderately reduced RVSF, moderate RAE  . 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 H/O tricuspid valve repair 2012  . Paroxysmal atrial fibrillation (HCC)     s/p Maze procedure at time of MVR in 2011  . Diabetes mellitus type 2 in obese ([place])   . Obstructive sleep apnea   . Chronic kidney disease (CKD), stage IV (severe) ([place])   . Anemia of chronic disease   . Pulmonary HTN ([place])   . History of echocardiogram     Echo at [facility] in [facility] 4/12: mod LVH, EF 60-65%, mod LAE, tissue MVR ok, mod TR, mod pulmo HTN with RVSP 48 mmHg  . Asthma   . GERD (gastroesophageal reflux disease)    Past  Surgical History  Procedure Laterality Date  . Cardiac surgery      . allopurinol  100 mg Oral BID  . azithromycin  500 mg Oral Daily  . digoxin  0.125 mg Intravenous Once  . digoxin  0.125 mg Oral Daily  . furosemide  80 mg Intravenous BID  . hydrALAZINE  25 mg Oral 3 times per day  . insulin aspart  0-5 Units Subcutaneous QHS  . insulin aspart  0-9 Units Subcutaneous TID WC  . insulin glargine  20 Units Subcutaneous QHS  . [START ON 12/31/2015] isosorbide mononitrate  15 mg Oral Daily  . metoprolol tartrate  75 mg Oral BID  . potassium chloride SA  20 mEq Oral Daily  . sodium chloride flush  3 mL Intravenous Q12H  . [START ON 12/31/2015] Vitamin D (Ergocalciferol)  50,000 Units Oral Q7 days  . Warfarin - Pharmacist Dosing Inpatient   Does not apply q1800   . heparin 1,350 Units/hr (12/30/15 0602)    No Known Allergies  Social History   Social History  . Marital Status: Single    Spouse Name: N/A  . Number of Children: N/A  . Years of Education: N/A   Occupational History  . Not on file.   Social History Main Topics  . Smoking status: Current Some Day Smoker  . Smokeless tobacco: Not on file  . Alcohol Use: No  . Drug Use: No  . Sexual Activity: Not on file  Other Topics Concern  . Not on file   Social History Narrative    Family History  Problem Relation Age of Onset  . Heart attack Neg Hx     ROS- All systems are reviewed and negative except as per the HPI above  Physical Exam: Telemetry: rate controlled afib Filed Vitals:   12/30/15 0600 12/30/15 0722 12/30/15 0800 12/30/15 0900  BP: 102/71 97/86 104/82 114/97  Pulse: 28 161 48 76  Temp:  97.9 F (36.6 C)    TempSrc:  Oral    Resp: 19 27 19 15   Height:      Weight:      SpO2: 96% 98% 97% 98%    GEN- The patient is chronically ill appearing, sleeping but rouses Head- normocephalic, atraumatic Eyes-  Sclera clear, conjunctiva pink Ears- hearing intact Oropharynx- clear Neck- supple,+  JVD Lungs- very coarse BS with expiratory wheezes, no rales, normal work of breathing Heart- irregular rate and rhythm  GI- soft, NT, ND, + BS Extremities- no clubbing, cyanosis, +1 chronic edema MS- no significant deformity or atrophy Skin- no rash or lesion Psych- euthymic mood, full affect Neuro- strength and sensation are intact  EKG: afib with V rate 108 bpm, nonspecific ST/T changes  Labs:   Lab Results  Component Value Date   WBC 6.6 12/30/2015   HGB 9.6* 12/30/2015   HCT 31.2* 12/30/2015   MCV 68.3* 12/30/2015   PLT 303 12/30/2015    Recent Labs Lab 12/30/15 0300  NA 140  K 4.2  CL 105  CO2 27  BUN 61*  CREATININE 2.28*  CALCIUM 8.8*  GLUCOSE 109*   Lab Results  Component Value Date   TROPONINI <0.03 10/10/2015   No results found for: CHOL No results found for: HDL No results found for: LDLCALC No results found for: TRIG No results found for: CHOLHDL No results found for: LDLDIRECT    Radiology: CXR reveals no edema  Echo 09/11/15 is reviewed  ASSESSMENT AND PLAN:  Michael [name] is a 55 y.o. male with a h/o mitral valve disease status post bioprosthetic mitral valve replacement in 2012 at [facility] in [place], tricuspid valve repair, ?CAD s/p CABG (notes from hospital are conflicting - patient denies hx of CABG, records from [facility] do not mention CABG), chronic systolic dysfunction (EF [id number]), permanent atrial fibrillation, CKD, OSA, diabetes, HTN, HL, and pulmonary hypertension. He is admitted with URI symptoms.  1. URI Symptoms are primarily due to URI. No real edema on CXR or lung exam.   Primary team has initiated on azithromycin Supportive care  2. Acute on chronic systolic dysfunction/ valvular heart disease Appears mildly volume overloaded though I do not think that this is the cause for his presentation Will continue gentle diuresis as tolerated with current lasix dosing Continue home medicines 2 gram sodium diet Strict Is and Os  3.  Chronic renal failure- stage IV Caution with duiresis Salt restriction  4. Permanent atrial fibrillation Rates are reasonably controlled Continue current medicines Can increase metoprolol as needed On heparin drip Resume coumadin per pharmacy   Thompson Grayer, MD 12/30/2015  12:35 PM

## 2015-12-30 NOTE — Progress Notes (Signed)
ANTICOAGULATION CONSULT NOTE - Follow-up Consult  Pharmacy Consult for heparin and coumadin Indication: atrial fibrillation  No Known Allergies  Patient Measurements: Height: 5\' 4"  (162.6 cm) Weight: 202 lb 3.2 oz (91.717 kg) IBW/kg (Calculated) : 59.2 Heparin Dosing Weight: 80.2 kg  Vital Signs: Temp: 97 F (36.1 C) (02/12 1239) Temp Source: Oral (02/12 1239) BP: 125/83 mmHg (02/12 1239) Pulse Rate: 82 (02/12 1239)  Labs:  Recent Labs  12/29/15 1436 12/30/15 0300 12/30/15 1028  HGB 10.4* 9.6*  --   HCT 33.3* 31.2*  --   PLT 326 303  --   LABPROT 18.3*  --  18.0*  INR 1.52*  --  1.48  HEPARINUNFRC  --  0.26* 0.41  CREATININE 2.08* 2.28*  --     Estimated Creatinine Clearance: 36.9 mL/min (by C-G formula based on Cr of 2.28).  Assessment: 55 yo male with 2 day hx of SOB, orthopnea - afib with RVR. Pt on warfarin PTA - to bridge with heparin for low INR on admit - INR 1.48. Heparin level therapeutic at 0.48 after a rate increased to 1350 units/h.  Hgb 9.6, pltc WNL. No issues with infusion. Pt with small nosebleed earlier today. Has resolved per RN.  Goal of Therapy:  INR 2-3 Heparin level 0.3-0.7 units/ml Monitor platelets by anticoagulation protocol: Yes   Plan:  Continue heparin infusion at 1350 units/hr Coumadin 7.5 mg po x 1 dose today Daily HL, CBC, INR F/u further nosebleeds  [name], Pharm.D. BP:[phone] 12/30/2015 1:42 PM

## 2015-12-30 NOTE — Progress Notes (Signed)
ANTICOAGULATION CONSULT NOTE - Follow-up Consult  Pharmacy Consult for heparin Indication: atrial fibrillation  No Known Allergies  Patient Measurements: Height: 5\' 4"  (162.6 cm) Weight: 199 lb 15.3 oz (90.7 kg) IBW/kg (Calculated) : 59.2 Heparin Dosing Weight: 80.2 kg  Vital Signs: Temp: 97.4 F (36.3 C) (02/12 0353) Temp Source: Oral (02/12 0353) BP: 114/82 mmHg (02/12 0400) Pulse Rate: 108 (02/12 0400)  Labs:  Recent Labs  12/29/15 1436 12/30/15 0300  HGB 10.4* 9.6*  HCT 33.3* 31.2*  PLT 326 303  LABPROT 18.3*  --   INR 1.52*  --   HEPARINUNFRC  --  0.26*  CREATININE 2.08*  --     Estimated Creatinine Clearance: 40.3 mL/min (by C-G formula based on Cr of 2.08).  Assessment: 55 yo male with 2 day hx of SOB, orthopnea - afib with RVR. Pt on warfarin PTA - to bridge with heparin for low INR on admit - INR 1.52. Heparin level 0.26 (subtherapeutic) on gtt at 1200 units/hr. Hgb down a bit. No issues with infusion. Pt with small nosebleed earlier today. Has resolved per RN.  Goal of Therapy:  INR 2-3 Heparin level 0.3-0.7 units/ml Monitor platelets by anticoagulation protocol: Yes   Plan:  Increase heparin infusion to 1350 units/hr F/u 6 hr heparin level F/u further nosebleeds  Sherlon Handing, PharmD, BCPS Clinical pharmacist, pager [phone] 12/30/2015 4:04 AM

## 2015-12-30 NOTE — Progress Notes (Signed)
Patient Demographics:    Michael [name], is a 55 y.o. male, DOB - August 01, 1961, [id number]  Admit date - 12/29/2015   Admitting Physician Thurnell Lose, MD  Outpatient Primary MD for the patient is No PCP Per Patient  LOS - 1   Chief Complaint  Patient presents with  . Shortness of Breath        Subjective:    Michael [name] today has, No headache, No chest pain, No abdominal pain - No Nausea, No new weakness tingling or numbness, improved Cough - SOB.     Assessment  & Plan :     1. Acute hypoxic respiratory failure due to acute on chronic systolic CHF EF [id number] and a patient with dilated cardiomyopathy. Continue diuresis with increased dose IV Lasix, one dose of Zaroxolyn on 12/30/2015, continue fluid and salt restriction, continue beta blocker, not on ACE/ARB due to CK D stage4. Continue Imdur along with hydralazine, will monitor intake and output and daily weight. Oxygen and nebulizer treatments as needed. Requested cardiology to evaluate on 12/30/2015.   2. Positive productive cough with frothy phlegm. Likely due to #1 above. URI cannot be ruled out, will order sputum Gram stain and culture and place on azithromycin for now. Supportive care as above.   3. CK D stage IV. Creatinine appears to be at baseline of around 2. Will monitor with diuresis.   4. History of mitral valve repair. Has bioprosthetic mitral valve. Continue supportive care and no acute issues.   5. Chronic atrial fibrillation currently in RVR. Check asked to score of greater than 4. Was on aspirin all drip, digoxin levels were low therefore have given him 2 doses of IV digoxin, continue oral Lopressor at higher dose if tolerated by blood pressure for better rate control. Continue Coumadin under the guidance of  pharmacy, since INR is subtherapeutic with relatively high [place] vasc 2 score will bridge with heparin drip. Cardiology to evaluate. If blood pressure stays to soft [name] becomes a challenge we might have to use amiodarone.   6. DM type II. Currently on sliding scale.  Lab Results  Component Value Date   HGBA1C 6.40 11/22/2015   CBG (last 3)   Recent Labs  12/29/15 2131 12/30/15 0725  GLUCAP 226* 90       Code Status : Full  Family Communication  : none  Disposition Plan  : Stay in stepdown  Consults  :  Cards  Procedures  :   DVT Prophylaxis  :    Heparin - Coumadin  Lab Results  Component Value Date   PLT 303 12/30/2015    Inpatient Medications  Scheduled Meds: . allopurinol  100 mg Oral BID  . azithromycin  500 mg Oral Daily  . digoxin  0.125 mg Intravenous Once  . digoxin  0.125 mg Oral Daily  . furosemide  40 mg Intravenous BID  . hydrALAZINE  25 mg Oral 3 times per day  . insulin aspart  0-5 Units Subcutaneous QHS  . insulin aspart  0-9 Units Subcutaneous TID WC  . insulin glargine  20 Units Subcutaneous QHS  . isosorbide mononitrate  30 mg Oral Daily  . metolazone  2.5 mg Oral Once  . metoprolol tartrate  75 mg  Oral BID  . potassium chloride SA  20 mEq Oral Daily  . sodium chloride flush  3 mL Intravenous Q12H  . [START ON 12/31/2015] Vitamin D (Ergocalciferol)  50,000 Units Oral Q7 days  . Warfarin - Pharmacist Dosing Inpatient   Does not apply q1800   Continuous Infusions: . heparin 1,350 Units/hr (12/30/15 0602)   PRN Meds:.acetaminophen, guaiFENesin-dextromethorphan, HYDROcodone-acetaminophen, metoprolol, ondansetron **OR** ondansetron (ZOFRAN) IV, sodium chloride  Antibiotics  :     Anti-infectives    Start     Dose/Rate Route Frequency Ordered Stop   12/29/15 1845  azithromycin (ZITHROMAX) tablet 500 mg     500 mg Oral Daily 12/29/15 1837          Objective:   Filed Vitals:   12/30/15 0400 12/30/15 0500 12/30/15 0600 12/30/15  0722  BP: 114/82 108/85 102/71 97/86  Pulse: 108 33 28 161  Temp:    97.9 F (36.6 C)  TempSrc:    Oral  Resp: 17 15 19 27   Height:      Weight:  91.717 kg (202 lb 3.2 oz)    SpO2: 97% 96% 96% 98%    Wt Readings from Last 3 Encounters:  12/30/15 91.717 kg (202 lb 3.2 oz)  12/26/15 94.802 kg (209 lb)  12/07/15 91.627 kg (202 lb)     Intake/Output Summary (Last 24 hours) at 12/30/15 0855 Last data filed at 12/30/15 0700  Gross per 24 hour  Intake  690.7 ml  Output    800 ml  Net -109.3 ml     Physical Exam  Awake Alert, Oriented X 3, No new F.N deficits, Normal affect [place].AT,PERRAL Supple Neck, elevated JVD, No cervical lymphadenopathy appriciated.  Symmetrical Chest wall movement, Good air movement bilaterally, basilar crackles iRRR,No Gallops,Rubs or new Murmurs, No Parasternal Heave +ve B.Sounds, Abd Soft, No tenderness, No organomegaly appriciated, No rebound - guarding or rigidity. No Cyanosis, Clubbing , 1+ edema, No new Rash or bruise    Data Review:   Micro Results Recent Results (from the past 240 hour(s))  MRSA PCR Screening     Status: None   Collection Time: 12/29/15  8:24 PM  Result Value Ref Range Status   MRSA by PCR NEGATIVE NEGATIVE Final    Comment:        The GeneXpert MRSA Assay (FDA approved for NASAL specimens only), is one component of a comprehensive MRSA colonization surveillance program. It is not intended to diagnose MRSA infection nor to guide or monitor treatment for MRSA infections.     Radiology Reports Dg Chest 2 View  12/29/2015  CLINICAL DATA:  Three-day history of shortness of breath. Cough and wheezing EXAM: CHEST  2 VIEW COMPARISON:  October 10, 2015 FINDINGS: There is no edema or consolidation. There is mild cardiomegaly with pulmonary vascularity within normal limits. No adenopathy. Patient is status post coronary artery bypass grafting. No bone lesions are apparent. IMPRESSION: Stable cardiac enlargement.  No edema or  consolidation. Electronically Signed   By: Lowella Grip III M.D.   On: 12/29/2015 15:47     CBC  Recent Labs Lab 12/29/15 1436 12/30/15 0300  WBC 6.0 6.6  HGB 10.4* 9.6*  HCT 33.3* 31.2*  PLT 326 303  MCV 68.5* 68.3*  MCH 21.4* 21.0*  MCHC 31.2 30.8  RDW 18.8* 18.8*    Chemistries   Recent Labs Lab 12/26/15 1705 12/29/15 1436 12/30/15 0300  NA 135 140 140  K 4.4 4.9 4.2  CL 102 104 105  CO2 24 24 27   GLUCOSE 116* 171* 109*  BUN 60* 61* 61*  CREATININE 2.19* 2.08* 2.28*  CALCIUM 9.3 9.0 8.8*   ------------------------------------------------------------------------------------------------------------------ No results for input(s): CHOL, HDL, LDLCALC, TRIG, CHOLHDL, LDLDIRECT in the last 72 hours.  Lab Results  Component Value Date   HGBA1C 6.40 11/22/2015   ------------------------------------------------------------------------------------------------------------------ No results for input(s): TSH, T4TOTAL, T3FREE, THYROIDAB in the last 72 hours.  Invalid input(s): FREET3 ------------------------------------------------------------------------------------------------------------------ No results for input(s): VITAMINB12, FOLATE, FERRITIN, TIBC, IRON, RETICCTPCT in the last 72 hours.  Coagulation profile  Recent Labs Lab 12/26/15 1628 12/29/15 1436  INR 1.4 1.52*    No results for input(s): DDIMER in the last 72 hours.  Cardiac Enzymes No results for input(s): CKMB, TROPONINI, MYOGLOBIN in the last 168 hours.  Invalid input(s): CK ------------------------------------------------------------------------------------------------------------------    Component Value Date/Time   BNP 1453.8* 12/29/2015 1436    Time Spent in minutes 35   Jae Skeet K M.D on 12/30/2015 at 8:55 AM  Between 7am to 7pm - Pager - [phone]  After 7pm go to [email or web address] - password [facility]  [facility] -  Office  [phone]

## 2015-12-31 ENCOUNTER — Encounter (HOSPITAL_COMMUNITY): Payer: Self-pay | Admitting: *Deleted

## 2015-12-31 ENCOUNTER — Inpatient Hospital Stay (HOSPITAL_COMMUNITY): Payer: Medicare Other

## 2015-12-31 DIAGNOSIS — I059 Rheumatic mitral valve disease, unspecified: Secondary | ICD-10-CM

## 2015-12-31 DIAGNOSIS — I420 Dilated cardiomyopathy: Secondary | ICD-10-CM

## 2015-12-31 LAB — CBC
HEMATOCRIT: 33.7 % — AB (ref 39.0–52.0)
Hemoglobin: 10.1 g/dL — ABNORMAL LOW (ref 13.0–17.0)
MCH: 20.7 pg — AB (ref 26.0–34.0)
MCHC: 30 g/dL (ref 30.0–36.0)
MCV: 69.1 fL — AB (ref 78.0–100.0)
Platelets: 290 10*3/uL (ref 150–400)
RBC: 4.88 MIL/uL (ref 4.22–5.81)
RDW: 18.8 % — AB (ref 11.5–15.5)
WBC: 6.4 10*3/uL (ref 4.0–10.5)

## 2015-12-31 LAB — GLUCOSE, CAPILLARY
GLUCOSE-CAPILLARY: 94 mg/dL (ref 65–99)
GLUCOSE-CAPILLARY: 100 mg/dL — AB (ref 65–99)
GLUCOSE-CAPILLARY: 154 mg/dL — AB (ref 65–99)
Glucose-Capillary: 111 mg/dL — ABNORMAL HIGH (ref 65–99)

## 2015-12-31 LAB — PROTIME-INR
INR: 1.48 (ref 0.00–1.49)
Prothrombin Time: 18 seconds — ABNORMAL HIGH (ref 11.6–15.2)

## 2015-12-31 LAB — HEPARIN LEVEL (UNFRACTIONATED): Heparin Unfractionated: 0.45 IU/mL (ref 0.30–0.70)

## 2015-12-31 LAB — HEMOGLOBIN A1C
HEMOGLOBIN A1C: 7.3 % — AB (ref 4.8–5.6)
Mean Plasma Glucose: 163 mg/dL

## 2015-12-31 MED ORDER — METOLAZONE 2.5 MG PO TABS
2.5000 mg | ORAL_TABLET | Freq: Once | ORAL | Status: AC
  Administered 2015-12-31: 2.5 mg via ORAL
  Filled 2015-12-31: qty 1

## 2015-12-31 MED ORDER — WARFARIN SODIUM 7.5 MG PO TABS
7.5000 mg | ORAL_TABLET | Freq: Once | ORAL | Status: AC
  Administered 2015-12-31: 7.5 mg via ORAL
  Filled 2015-12-31: qty 1.5

## 2015-12-31 MED ORDER — FUROSEMIDE 10 MG/ML IJ SOLN
80.0000 mg | Freq: Three times a day (TID) | INTRAMUSCULAR | Status: DC
  Administered 2015-12-31 – 2016-01-02 (×7): 80 mg via INTRAVENOUS
  Filled 2015-12-31 (×7): qty 8

## 2015-12-31 MED ORDER — METOPROLOL TARTRATE 100 MG PO TABS
100.0000 mg | ORAL_TABLET | Freq: Two times a day (BID) | ORAL | Status: DC
  Administered 2015-12-31 – 2016-01-02 (×4): 100 mg via ORAL
  Filled 2015-12-31 (×4): qty 1

## 2015-12-31 NOTE — Care Management Important Message (Signed)
Important Message  Patient Details  Name: Michael [name] MRN: [id number] Date of Birth: July 12, 1961   Medicare Important Message Given:  Yes    Rylinn Linzy P Saad Buhl 12/31/2015, 2:01 PM

## 2015-12-31 NOTE — Progress Notes (Signed)
Report given to receiving RN on 3E room 30

## 2015-12-31 NOTE — Progress Notes (Signed)
Patient Demographics:    Michael [name], is a 55 y.o. male, DOB - 1961/03/30, YS:[id number]  Admit date - 12/29/2015   Admitting Physician Thurnell Lose, MD  Outpatient Primary MD for the patient is No PCP Per Patient  LOS - 2   Chief Complaint  Patient presents with  . Shortness of Breath        Subjective:    Michael [name] today has, No headache, No chest pain, No abdominal pain - No Nausea, No new weakness tingling or numbness, improved Cough - SOB.     Assessment  & Plan :     1. Acute hypoxic respiratory failure due to acute on chronic systolic CHF EF [id number] and a patient with dilated cardiomyopathy. Continue diuresis with increased dose IV Lasix, one more dose of Zaroxolyn on 12/31/2015, continue fluid and salt restriction, continue beta blocker, not on ACE/ARB due to CK D stage4. Continue Imdur along with hydralazine, monitor intake and output and daily weight. Oxygen and nebulizer treatments as needed. Cards following. Weight unreliable, -ve 1.5 lits.  Filed Weights   12/29/15 2005 12/30/15 0500 12/31/15 0500  Weight: 90.7 kg (199 lb 15.3 oz) 91.717 kg (202 lb 3.2 oz) 91.354 kg (201 lb 6.4 oz)     2. Positive productive cough with frothy phlegm. Likely due to #1 above. URI cannot be ruled out, will order sputum Gram stain and culture and place on azithromycin for now. Supportive care as above. Flutter valve added.   3. CK D stage IV. Creatinine appears to be at baseline of around 2. Will monitor with diuresis.   4. History of mitral valve repair. Has bioprosthetic mitral valve. Continue supportive care and no acute issues.   5. Chronic atrial fibrillation currently in RVR. Check asked to score of greater than 4. Was on aspirin all drip, digoxin levels were low therefore  have given him 2 doses of IV digoxin, continue oral Lopressor at higher dose if tolerated by blood pressure for better rate control. Continue Coumadin under the guidance of pharmacy, since INR is subtherapeutic with relatively high [place] vasc 2 score will bridge with heparin drip. Cardiology to evaluate. If blood pressure stays to soft [name] becomes a challenge we might have to use amiodarone.   6. DM type II. Currently on sliding scale.  Lab Results  Component Value Date   HGBA1C 7.3* 12/29/2015   CBG (last 3)   Recent Labs  12/30/15 1604 12/30/15 2041 12/31/15 0755  GLUCAP 141* 127* 94       Code Status : Full  Family Communication  : none  Disposition Plan  : Stay in stepdown  Consults  :  Cards  Procedures  :   DVT Prophylaxis  :    Heparin - Coumadin  Lab Results  Component Value Date   PLT 290 12/31/2015    Inpatient Medications  Scheduled Meds: . allopurinol  100 mg Oral BID  . antiseptic oral rinse  7 mL Mouth Rinse BID  . azithromycin  500 mg Oral Daily  . digoxin  0.125 mg Oral Daily  . furosemide  80 mg Intravenous TID  . hydrALAZINE  25 mg Oral 3 times per day  . insulin aspart  0-5 Units Subcutaneous QHS  .  insulin aspart  0-9 Units Subcutaneous TID WC  . insulin glargine  20 Units Subcutaneous QHS  . isosorbide mononitrate  15 mg Oral Daily  . metolazone  2.5 mg Oral Once  . metoprolol tartrate  75 mg Oral BID  . potassium chloride SA  20 mEq Oral Daily  . sodium chloride flush  3 mL Intravenous Q12H  . Vitamin D (Ergocalciferol)  50,000 Units Oral Q7 days  . Warfarin - Pharmacist Dosing Inpatient   Does not apply q1800   Continuous Infusions: . heparin 1,350 Units/hr (12/30/15 1500)   PRN Meds:.acetaminophen, guaiFENesin-dextromethorphan, HYDROcodone-acetaminophen, metoprolol, [DISCONTINUED] ondansetron **OR** ondansetron (ZOFRAN) IV, sodium chloride  Antibiotics  :     Anti-infectives    Start     Dose/Rate Route Frequency Ordered  Stop   12/29/15 1845  azithromycin (ZITHROMAX) tablet 500 mg     500 mg Oral Daily 12/29/15 1837          Objective:   Filed Vitals:   12/31/15 0500 12/31/15 0549 12/31/15 0745 12/31/15 0918  BP: 125/72 125/72 109/82 114/75  Pulse: 61  98 79  Temp:   97.8 F (36.6 C)   TempSrc:   Oral   Resp: 14  22   Height:      Weight: 91.354 kg (201 lb 6.4 oz)     SpO2: 98%  98%     Wt Readings from Last 3 Encounters:  12/31/15 91.354 kg (201 lb 6.4 oz)  12/26/15 94.802 kg (209 lb)  12/07/15 91.627 kg (202 lb)     Intake/Output Summary (Last 24 hours) at 12/31/15 1044 Last data filed at 12/31/15 0920  Gross per 24 hour  Intake 1033.5 ml  Output   2025 ml  Net -991.5 ml     Physical Exam  Awake Alert, Oriented X 3, No new F.N deficits, Normal affect Michael [name],PERRAL Supple Neck, elevated JVD, No cervical lymphadenopathy appriciated.  Symmetrical Chest wall movement, Good air movement bilaterally, basilar crackles iRRR,No Gallops,Rubs or new Murmurs, No Parasternal Heave +ve B.Sounds, Abd Soft, No tenderness, No organomegaly appriciated, No rebound - guarding or rigidity. No Cyanosis, Clubbing , 1+ edema, No new Rash or bruise    Data Review:   Micro Results Recent Results (from the past 240 hour(s))  MRSA PCR Screening     Status: None   Collection Time: 12/29/15  8:24 PM  Result Value Ref Range Status   MRSA by PCR NEGATIVE NEGATIVE Final    Comment:        The GeneXpert MRSA Assay (FDA approved for NASAL specimens only), is one component of a comprehensive MRSA colonization surveillance program. It is not intended to diagnose MRSA infection nor to guide or monitor treatment for MRSA infections.     Radiology Reports Dg Chest 2 View  12/31/2015  CLINICAL DATA:  Shortness of breath with cough and chest congestion. EXAM: CHEST  2 VIEW COMPARISON:  12/29/2015 FINDINGS: Chronic cardiomegaly. Pulmonary vascularity is normal and the lungs are clear. No effusions.  Calcification in the thoracic aorta. Prior median sternotomy. IMPRESSION: No acute abnormalities.  Chronic cardiomegaly. Electronically Signed   By: [name] M.D.   On: 12/31/2015 08:38   Dg Chest 2 View  12/29/2015  CLINICAL DATA:  Three-day history of shortness of breath. Cough and wheezing EXAM: CHEST  2 VIEW COMPARISON:  October 10, 2015 FINDINGS: There is no edema or consolidation. There is mild cardiomegaly with pulmonary vascularity within normal limits. No adenopathy. Patient is status post coronary  artery bypass grafting. No bone lesions are apparent. IMPRESSION: Stable cardiac enlargement.  No edema or consolidation. Electronically Signed   By: Lowella Grip III M.D.   On: 12/29/2015 15:47     CBC  Recent Labs Lab 12/29/15 1436 12/30/15 0300 12/31/15 0220  WBC 6.0 6.6 6.4  HGB 10.4* 9.6* 10.1*  HCT 33.3* 31.2* 33.7*  PLT 326 303 290  MCV 68.5* 68.3* 69.1*  MCH 21.4* 21.0* 20.7*  MCHC 31.2 30.8 30.0  RDW 18.8* 18.8* 18.8*    Chemistries   Recent Labs Lab 12/26/15 1705 12/29/15 1436 12/30/15 0300  NA 135 140 140  K 4.4 4.9 4.2  CL 102 104 105  CO2 24 24 27   GLUCOSE 116* 171* 109*  BUN 60* 61* 61*  CREATININE 2.19* 2.08* 2.28*  CALCIUM 9.3 9.0 8.8*   ------------------------------------------------------------------------------------------------------------------ No results for input(s): CHOL, HDL, LDLCALC, TRIG, CHOLHDL, LDLDIRECT in the last 72 hours.  Lab Results  Component Value Date   HGBA1C 7.3* 12/29/2015   ------------------------------------------------------------------------------------------------------------------ No results for input(s): TSH, T4TOTAL, T3FREE, THYROIDAB in the last 72 hours.  Invalid input(s): FREET3 ------------------------------------------------------------------------------------------------------------------ No results for input(s): VITAMINB12, FOLATE, FERRITIN, TIBC, IRON, RETICCTPCT in the last 72  hours.  Coagulation profile  Recent Labs Lab 12/26/15 1628 12/29/15 1436 12/30/15 1028 12/31/15 0220  INR 1.4 1.52* 1.48 1.48    No results for input(s): DDIMER in the last 72 hours.  Cardiac Enzymes No results for input(s): CKMB, TROPONINI, MYOGLOBIN in the last 168 hours.  Invalid input(s): CK ------------------------------------------------------------------------------------------------------------------    Component Value Date/Time   BNP 1453.8* 12/29/2015 1436    Time Spent in minutes 35   Trace Cederberg K M.D on 12/31/2015 at 10:44 AM  Between 7am to 7pm - Pager - [phone]  After 7pm go to [email or web address] - password [facility]  [facility] -  Office  [phone]

## 2015-12-31 NOTE — Progress Notes (Signed)
Hospital Problem List     Principal Problem:   Acute hypoxemic respiratory failure (HCC) Active Problems:   CKD (chronic kidney disease), stage IV (HCC)   Essential hypertension   Mitral valve disease   Anemia - mild exacerbation   Chronic atrial fibrillation (HCC)   Dilated cardiomyopathy (HCC)   Acute respiratory failure [facility])    Patient Profile:   55 y.o. male with PMH of mitral valve disease (s/p MVR in 2012 at [facility] in [place]), tricuspid valve repair, CAD, chronic systolic dysfunction (EF [id number] - 35%), permanent atrial fibrillation, CKD, OSA, diabetes, HTN, HLD, and pulmonary hypertension admitted on 12/29/2015 for acute hypoxic respiratory failure.  Subjective   Reports his breathing is about the same. Still on 2L [place]. Denies any chest pain or palpitations.   Inpatient Medications    . allopurinol  100 mg Oral BID  . antiseptic oral rinse  7 mL Mouth Rinse BID  . azithromycin  500 mg Oral Daily  . digoxin  0.125 mg Oral Daily  . furosemide  80 mg Intravenous TID  . hydrALAZINE  25 mg Oral 3 times per day  . insulin aspart  0-5 Units Subcutaneous QHS  . insulin aspart  0-9 Units Subcutaneous TID WC  . insulin glargine  20 Units Subcutaneous QHS  . isosorbide mononitrate  15 mg Oral Daily  . metoprolol tartrate  75 mg Oral BID  . potassium chloride SA  20 mEq Oral Daily  . sodium chloride flush  3 mL Intravenous Q12H  . Vitamin D (Ergocalciferol)  50,000 Units Oral Q7 days  . warfarin  7.5 mg Oral ONCE-1800  . Warfarin - Pharmacist Dosing Inpatient   Does not apply q1800    Vital Signs    Filed Vitals:   12/31/15 0745 12/31/15 0918 12/31/15 1200 12/31/15 1322  BP: 109/82 114/75 124/89 123/94  Pulse: 98 79 77   Temp: 97.8 F (36.6 C)  97.9 F (36.6 C)   TempSrc: Oral  Axillary   Resp: 22  11   Height:      Weight:      SpO2: 98%  100%     Intake/Output Summary (Last 24 hours) at 12/31/15 1337 Last data filed at 12/31/15 1142  Gross per 24 hour    Intake 1273.5 ml  Output   2625 ml  Net -1351.5 ml   Filed Weights   12/29/15 2005 12/30/15 0500 12/31/15 0500  Weight: 199 lb 15.3 oz (90.7 kg) 202 lb 3.2 oz (91.717 kg) 201 lb 6.4 oz (91.354 kg)    Physical Exam    General: Well developed, well nourished, male in no acute distress. Head: Normocephalic, atraumatic.  Neck: Supple without bruits, JVD elevated to 8cm. Lungs:  Resp regular and unlabored, coarse breath sounds throughout with mild expiratory wheeze. Heart: Irregularly irregular, S1, S2, no S3, S4, or murmur; no rub. Abdomen: Soft, non-tender, non-distended with normoactive bowel sounds. No hepatomegaly. No rebound/guarding. No obvious abdominal masses. Extremities: No clubbing or cyanosis.  1+ edema bilaterally up to mid-shins. Distal pedal pulses are 2+ bilaterally. Neuro: Alert and oriented X 3. Moves all extremities spontaneously. Psych: Normal affect.  Labs    CBC  Recent Labs  12/30/15 0300 12/31/15 0220  WBC 6.6 6.4  HGB 9.6* 10.1*  HCT 31.2* 33.7*  MCV 68.3* 69.1*  PLT 303 [id number]   Basic Metabolic Panel  Recent Labs  12/29/15 1436 12/30/15 0300  NA 140 140  K 4.9 4.2  CL 104 105  CO2 24 27  GLUCOSE 171* 109*  BUN 61* 61*  CREATININE 2.08* 2.28*  CALCIUM 9.0 8.8*   Hemoglobin A1C  Recent Labs  12/29/15 2031  HGBA1C 7.3*    Telemetry    Atrial fibrillation, rate-controlled in the 80's - 90's.   ECG    No new tracings.   Cardiac Studies and Radiology    Dg Chest 2 View: 12/31/2015  CLINICAL DATA:  Shortness of breath with cough and chest congestion. EXAM: CHEST  2 VIEW COMPARISON:  12/29/2015 FINDINGS: Chronic cardiomegaly. Pulmonary vascularity is normal and the lungs are clear. No effusions. Calcification in the thoracic aorta. Prior median sternotomy. IMPRESSION: No acute abnormalities.  Chronic cardiomegaly. Electronically Signed   By: [name] M.D.   On: 12/31/2015 08:38   Dg Chest 2 View: 12/29/2015  CLINICAL DATA:   Three-day history of shortness of breath. Cough and wheezing EXAM: CHEST  2 VIEW COMPARISON:  October 10, 2015 FINDINGS: There is no edema or consolidation. There is mild cardiomegaly with pulmonary vascularity within normal limits. No adenopathy. Patient is status post coronary artery bypass grafting. No bone lesions are apparent. IMPRESSION: Stable cardiac enlargement.  No edema or consolidation. Electronically Signed   By: Lowella Grip III M.D.   On: 12/29/2015 15:47   Echocardiogram: 09/11/2015 Study Conclusions - Left ventricle: The cavity size was normal. Wall thickness was increased in a pattern of mild LVH. Systolic function was moderately to severely reduced. The estimated ejection fraction was in the range of 30% to 35%. - Ventricular septum: The contour showed diastolic flattening. - Mitral valve: A bioprosthesis was present. Valve area by continuity equation (using LVOT flow): 0.89 cm^2. - Left atrium: The atrium was severely dilated. - Right ventricle: The cavity size was mildly dilated. Systolic function was mildly to moderately reduced. - Right atrium: The atrium was moderately dilated.  Assessment & Plan    1. Acute Hypoxic Respiratory Failure - Symptoms are likely primarily due to URI, especially with his productive cough. CXR without any acute findings. - currently on Azithromycin.  - per admitting team  2. Acute on chronic systolic HF - Echo in [id number] showed EF of [id number] with diastolic flattening of the ventricular septum. - Weight 203 lbs on admission, currently at 201 lbs on 12/31/2015. Discharge weight was 192 in 10/2015 yet his weight is the office has been greater than 200 lbs since. Was 202 lbs in 11/2015, 209 lbs on 12/25/2015.Continue to obtain daily weights. - Net output of -1.4L thus far. Was on Lasix 80mg  PO BID as an outpatient. Scheduled to receive IV Lasix 80mg  TID today. CXR showed no evidence of fluid overload yet he does have elevated JVD  and lower extremity edema on exam. Will likely need to decrease his IV Lasix dose back to 80mg  BID or once daily tomorrow pending his creatinine in the AM. - continue BB. No ACE-I/ARB due to Stage 4 CKD.  3. Chronic renal failure- stage IV - baseline creatinine 2.0. - elevated to 2.19 on admission. 2.28 on 12/30/2015. - continue to monitor with IV diuresis.  4. Permanent atrial fibrillation - s/p MAZE procedure in 2011 at the time of his MVR - Rates are reasonably controlled with Digoxin 0.125mg  and Lopressor 75mg  BID. BP stable on current Lopressor dose. - This patients CHA2DS2-VASc Score and unadjusted Ischemic Stroke Rate (% per year) is equal to 4.8 % stroke rate/year from a score of 4 (CHF, HTN, Vascular, DM). Resuming Coumadin per pharmacy dosing with Heparin  bridge.  5. Mitral valve disease - s/p MVR in [place].  Signed, Erma Heritage , PA-C 1:37 PM 12/31/2015 Pager: [phone]   I have seen and examined the patient along with Erma Heritage , PA-C.  I have reviewed the chart, notes and new data.  I agree with PA/NP's note.  Key new complaints: still orthopneic, dyspnea with minimal movement Key examination changes: no leg edema, JVP hard to see, quiet lungs. Weight is 10 lb higher than at discharge on 30th of November, but that was probably an erroneous value (weight was 206 on 11/28, 202 on 11/29) Key new findings / data: reviewed echo from October  PLAN: Agree that symptoms may be in large part due to a pulmonary problem rather than a cardiac one. Increase beta blocker to prolong diastolic filling time across MV prosthesis. Ideally get resting heart rate in 60s. His weight is probably same as it was at last hospital DC. Ultimately, may benefit from right heart cath to clarify volume status if he fails to improve with diuresis.  Sanda Klein, MD, [place] [phone] 12/31/2015, 4:11 PM

## 2015-12-31 NOTE — Progress Notes (Signed)
ANTICOAGULATION CONSULT NOTE - Follow-up Consult  Pharmacy Consult for heparin and coumadin Indication: atrial fibrillation  No Known Allergies  Patient Measurements: Height: 5\' 4"  (162.6 cm) Weight: 201 lb 6.4 oz (91.354 kg) IBW/kg (Calculated) : 59.2 Heparin Dosing Weight: 80.2 kg  Vital Signs: Temp: 97.8 F (36.6 C) (02/13 0745) Temp Source: Oral (02/13 0745) BP: 114/75 mmHg (02/13 0918) Pulse Rate: 79 (02/13 0918)  Labs:  Recent Labs  12/29/15 1436 12/30/15 0300 12/30/15 1028 12/31/15 0220  HGB 10.4* 9.6*  --  10.1*  HCT 33.3* 31.2*  --  33.7*  PLT 326 303  --  290  LABPROT 18.3*  --  18.0* 18.0*  INR 1.52*  --  1.48 1.48  HEPARINUNFRC  --  0.26* 0.41 0.45  CREATININE 2.08* 2.28*  --   --     Estimated Creatinine Clearance: 36.9 mL/min (by C-G formula based on Cr of 2.28).  Assessment: 55 yo male with 2 day hx of SOB, orthopnea. On warfarin 5mg  daily exc 7.5mg  on Mon/Thurs PTA for Afib to bridge with heparin for low INR on admit. INR on admit was 1.52 and now down to 1.48. HL remains therapeutic at 0.45 today. Also started on azithromycin this admit. Hgb low at 10.1, plts wnl. Did have nosebleed a couple days ago but has resolved per RN and no further issues.  Goal of Therapy:  INR 2-3 Heparin level 0.3-0.7 units/ml Monitor platelets by anticoagulation protocol: Yes   Plan:  Continue heparin gtt at 1,350 units/hr Give coumadin 7.5mg  PO x 1 today Monitor daily INR / HL, CBC, s/s of bleed  Elenor Quinones, PharmD, BCPS Clinical Pharmacist Pager [phone] 12/31/2015 11:13 AM

## 2016-01-01 ENCOUNTER — Inpatient Hospital Stay (HOSPITAL_COMMUNITY): Payer: Medicare Other

## 2016-01-01 DIAGNOSIS — I10 Essential (primary) hypertension: Secondary | ICD-10-CM

## 2016-01-01 LAB — PROTIME-INR
INR: 2.12 — ABNORMAL HIGH (ref 0.00–1.49)
Prothrombin Time: 23.6 seconds — ABNORMAL HIGH (ref 11.6–15.2)

## 2016-01-01 LAB — CBC
HCT: 33.8 % — ABNORMAL LOW (ref 39.0–52.0)
Hemoglobin: 10.2 g/dL — ABNORMAL LOW (ref 13.0–17.0)
MCH: 20.8 pg — AB (ref 26.0–34.0)
MCHC: 30.2 g/dL (ref 30.0–36.0)
MCV: 69 fL — AB (ref 78.0–100.0)
PLATELETS: 260 10*3/uL (ref 150–400)
RBC: 4.9 MIL/uL (ref 4.22–5.81)
RDW: 19 % — AB (ref 11.5–15.5)
WBC: 6.3 10*3/uL (ref 4.0–10.5)

## 2016-01-01 LAB — HEPARIN LEVEL (UNFRACTIONATED): Heparin Unfractionated: 0.73 IU/mL — ABNORMAL HIGH (ref 0.30–0.70)

## 2016-01-01 LAB — GLUCOSE, CAPILLARY
GLUCOSE-CAPILLARY: 111 mg/dL — AB (ref 65–99)
GLUCOSE-CAPILLARY: 118 mg/dL — AB (ref 65–99)
Glucose-Capillary: 151 mg/dL — ABNORMAL HIGH (ref 65–99)
Glucose-Capillary: 80 mg/dL (ref 65–99)

## 2016-01-01 MED ORDER — DOCUSATE SODIUM 100 MG PO CAPS
200.0000 mg | ORAL_CAPSULE | Freq: Two times a day (BID) | ORAL | Status: DC
  Administered 2016-01-01 – 2016-01-04 (×7): 200 mg via ORAL
  Filled 2016-01-01 (×7): qty 2

## 2016-01-01 MED ORDER — METOLAZONE 5 MG PO TABS
5.0000 mg | ORAL_TABLET | Freq: Once | ORAL | Status: AC
  Administered 2016-01-01: 5 mg via ORAL
  Filled 2016-01-01 (×2): qty 1

## 2016-01-01 MED ORDER — BISACODYL 10 MG RE SUPP
10.0000 mg | Freq: Every day | RECTAL | Status: DC
  Administered 2016-01-04: 10 mg via RECTAL
  Filled 2016-01-01 (×3): qty 1

## 2016-01-01 MED ORDER — WARFARIN SODIUM 5 MG PO TABS
5.0000 mg | ORAL_TABLET | Freq: Once | ORAL | Status: AC
  Administered 2016-01-01: 5 mg via ORAL
  Filled 2016-01-01: qty 1

## 2016-01-01 NOTE — Progress Notes (Signed)
Patient Demographics:    Michael [name], is a 55 y.o. male, DOB - 19-Mar-1961, YS:[id number]  Admit date - 12/29/2015   Admitting Physician Thurnell Lose, MD  Outpatient Primary MD for the patient is No PCP Per Patient  LOS - 3  Summary -  Michael [name] is a 55 y.o. 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 In ER workup was consistent with atrial fibrillation with RVR, acute hypoxic respiratory failure due to acute on chronic systolic CHF and possible URI and I was called to admit the patient.  He has since been seen by cardiology, was in stepdown, diuresed with IV Lasix and Zaroxolyn, still short of breath with rales, continue diuresis under the guidance of renal. Likely discharge in 2-3 days.  Chief Complaint  Patient presents with  . Shortness of Breath        Subjective:    Michael [name] today has, No headache, No chest pain, No abdominal pain - No Nausea, No new weakness tingling or numbness, improved Cough - SOB.     Assessment  & Plan :     1. Acute hypoxic  respiratory failure due to acute on chronic systolic CHF EF [id number] and a patient with dilated cardiomyopathy. Continue diuresis with increased dose IV Lasix, one more dose of Zaroxolyn on 01/01/2016, continue fluid and salt restriction, continue beta blocker, not on ACE/ARB due to CK D stage4. Continue Imdur along with hydralazine, also applied TED stockings, monitor intake and output and daily weight. Oxygen and nebulizer treatments as needed. Cards following. Weight unreliable, -ve 3.5 lits.  Filed Weights   12/31/15 0500 12/31/15 2251 01/01/16 0525  Weight: 91.354 kg (201 lb 6.4 oz) 90.583 kg (199 lb 11.2 oz) 89.721 kg (197 lb 12.8 oz)     2. Positive productive cough with frothy phlegm. Likely due to #1 above. Was initially treated with azithromycin, clinically has ruled out infection will stop antibiotic on 01/01/2016. Supportive care as above. Flutter valve added.   3. CK D stage IV. Creatinine appears to be at baseline of around 2. Will monitor with diuresis.   4. History of mitral valve repair. Has bioprosthetic mitral valve. Continue supportive care and no acute issues.   5. Chronic atrial fibrillation currently in RVR. Check asked to score of greater than 4. Was on aspirin all drip, digoxin levels were low therefore have given him 2 doses of IV digoxin, continue oral Lopressor at higher dose if tolerated by blood pressure for better rate control. Continue Coumadin under the guidance of pharmacy,  INR now therapeutic stop heparin drip. Cardiology following.  Lab Results  Component Value Date   INR 2.12* 01/01/2016   INR 1.48 12/31/2015   INR 1.48 12/30/2015    6. DM type II. Currently on sliding scale.  Lab Results  Component Value Date   HGBA1C 7.3* 12/29/2015   CBG (last 3)   Recent Labs  12/31/15 1739 12/31/15 2213 01/01/16 0548  GLUCAP 100* 154* 111*       Code Status : Full  Family Communication  : none  Disposition Plan  : Stay in stepdown  Consults  :   Cards  Procedures  :   DVT Prophylaxis  :    Heparin - Coumadin  Lab Results  Component Value Date   PLT 260 01/01/2016    Inpatient Medications  Scheduled Meds: . allopurinol  100 mg Oral BID  . antiseptic oral rinse  7 mL Mouth Rinse BID  . azithromycin  500 mg Oral Daily  . bisacodyl  10 mg Rectal Daily  . digoxin  0.125 mg Oral Daily  . docusate sodium  200 mg Oral BID  . furosemide  80 mg Intravenous TID  . hydrALAZINE  25 mg Oral 3 times per day  . insulin aspart  0-5 Units Subcutaneous QHS  . insulin aspart  0-9 Units Subcutaneous TID WC  . insulin glargine  20 Units Subcutaneous QHS  . isosorbide mononitrate  15 mg Oral Daily  . metolazone  5 mg Oral Once  . metoprolol tartrate  100 mg Oral BID  . potassium chloride SA  20 mEq Oral Daily  . sodium chloride flush  3 mL Intravenous Q12H  . Vitamin D (Ergocalciferol)  50,000 Units Oral Q7 days  . Warfarin - Pharmacist Dosing Inpatient   Does not apply q1800   Continuous Infusions: . heparin 1,350 Units/hr (01/01/16 0331)   PRN Meds:.acetaminophen, guaiFENesin-dextromethorphan, HYDROcodone-acetaminophen, metoprolol, [DISCONTINUED] ondansetron **OR** ondansetron (ZOFRAN) IV, sodium chloride  Antibiotics  :     Anti-infectives    Start     Dose/Rate Route Frequency Ordered Stop   12/29/15 1845  azithromycin (ZITHROMAX) tablet 500 mg     500 mg Oral Daily 12/29/15 1837          Objective:   Filed Vitals:   01/01/16 0525 01/01/16 0837 01/01/16 1000 01/01/16 1003  BP: 119/79 120/67  119/79  Pulse: 79 70 71 104  Temp: 97.8 F (36.6 C) 97.7 F (36.5 C)    TempSrc: Oral Oral    Resp: 21 20    Height:      Weight: 89.721 kg (197 lb 12.8 oz)     SpO2: 100% 100%      Wt Readings from Last 3 Encounters:  01/01/16 89.721 kg (197 lb 12.8 oz)  12/26/15 94.802 kg (209 lb)  12/07/15 91.627 kg (202 lb)     Intake/Output Summary (Last 24 hours) at 01/01/16 1029 Last data filed at 01/01/16 0918  Gross per 24  hour  Intake 1243.5 ml  Output   3585 ml  Net -2341.5 ml     Physical Exam  Awake Alert, Oriented X 3, No new F.N deficits, Normal affect [place].AT,PERRAL Supple Neck, elevated JVD, No cervical lymphadenopathy appriciated.  Symmetrical Chest wall movement, Good air movement bilaterally, basilar crackles iRRR,No Gallops,Rubs or new Murmurs, No Parasternal Heave +ve B.Sounds, Abd Soft, No tenderness, No organomegaly appriciated, No rebound - guarding or rigidity. No Cyanosis, Clubbing , 1+ edema, No new Rash or bruise  Data Review:   Micro Results Recent Results (from the past 240 hour(s))  MRSA PCR Screening     Status: None   Collection Time: 12/29/15  8:24 PM  Result Value Ref Range Status   MRSA by PCR NEGATIVE NEGATIVE Final    Comment:        The GeneXpert MRSA Assay (FDA approved for NASAL specimens only), is one component of a comprehensive MRSA colonization surveillance program. It is not intended to diagnose MRSA infection nor to guide or monitor treatment for MRSA infections.     Radiology Reports Dg Chest 2 View  12/31/2015  CLINICAL DATA:  Shortness of breath with cough and chest congestion. EXAM: CHEST  2 VIEW COMPARISON:  12/29/2015 FINDINGS: Chronic cardiomegaly. Pulmonary vascularity is normal and the lungs are clear. No effusions. Calcification in the thoracic aorta. Prior median sternotomy. IMPRESSION: No acute abnormalities.  Chronic cardiomegaly. Electronically Signed   By: [name] M.D.   On: 12/31/2015 08:38   Dg Chest 2 View  12/29/2015  CLINICAL DATA:  Three-day history of shortness of breath. Cough and wheezing EXAM: CHEST  2 VIEW COMPARISON:  October 10, 2015 FINDINGS: There is no edema or consolidation. There is mild cardiomegaly with pulmonary vascularity within normal limits. No adenopathy. Patient is status post coronary artery bypass grafting. No bone lesions are apparent. IMPRESSION: Stable cardiac enlargement.  No edema or  consolidation. Electronically Signed   By: Lowella Grip III M.D.   On: 12/29/2015 15:47   Dg Abd Portable 1v  01/01/2016  CLINICAL DATA:  Nausea, abdominal discomfort, chronic renal insufficiency. EXAM: PORTABLE ABDOMEN - 1 VIEW COMPARISON:  Renal ultrasound of September 11, 2015 FINDINGS: The bowel gas pattern is within the limits of normal. No free extraluminal gas collections are observed. There is gas in the stomach. There is a coarse calcific density that projects in the left mid abdomen. The bony structures exhibit no acute abnormalities. IMPRESSION: No acute intra-abdominal abnormality is observed. A coarse calcification in the left mid abdomen may lie within the kidney but none was demonstrated on the previous renal ultrasound. If the patient's symptoms warrant further imaging, abdominal and pelvic CT scanning would be a useful next imaging modality. Electronically Signed   By: David  Martinique M.D.   On: 01/01/2016 09:10     CBC  Recent Labs Lab 12/29/15 1436 12/30/15 0300 12/31/15 0220 01/01/16 0512  WBC 6.0 6.6 6.4 6.3  HGB 10.4* 9.6* 10.1* 10.2*  HCT 33.3* 31.2* 33.7* 33.8*  PLT 326 303 290 260  MCV 68.5* 68.3* 69.1* 69.0*  MCH 21.4* 21.0* 20.7* 20.8*  MCHC 31.2 30.8 30.0 30.2  RDW 18.8* 18.8* 18.8* 19.0*    Chemistries   Recent Labs Lab 12/26/15 1705 12/29/15 1436 12/30/15 0300  NA 135 140 140  K 4.4 4.9 4.2  CL 102 104 105  CO2 24 24 27   GLUCOSE 116* 171* 109*  BUN 60* 61* 61*  CREATININE 2.19* 2.08* 2.28*  CALCIUM 9.3 9.0 8.8*   ------------------------------------------------------------------------------------------------------------------ No results for input(s): CHOL, HDL, LDLCALC, TRIG, CHOLHDL, LDLDIRECT in the last 72 hours.  Lab Results  Component Value Date   HGBA1C 7.3* 12/29/2015   ------------------------------------------------------------------------------------------------------------------ No results for input(s): TSH, T4TOTAL, T3FREE,  THYROIDAB in the last 72 hours.  Invalid input(s): FREET3 ------------------------------------------------------------------------------------------------------------------ No results for input(s): VITAMINB12, FOLATE, FERRITIN, TIBC, IRON, RETICCTPCT in the last 72 hours.  Coagulation profile  Recent Labs Lab 12/26/15 1628 12/29/15 1436 12/30/15 1028 12/31/15 0220 01/01/16 0512  INR  1.4 1.52* 1.48 1.48 2.12*    No results for input(s): DDIMER in the last 72 hours.  Cardiac Enzymes No results for input(s): CKMB, TROPONINI, MYOGLOBIN in the last 168 hours.  Invalid input(s): CK ------------------------------------------------------------------------------------------------------------------    Component Value Date/Time   BNP 1453.8* 12/29/2015 1436    Time Spent in minutes 35   Louellen Haldeman K M.D on 01/01/2016 at 10:29 AM  Between 7am to 7pm - Pager - [phone]  After 7pm go to [email or web address] - password [facility]  [facility] -  Office  [phone]

## 2016-01-01 NOTE — Progress Notes (Signed)
ANTICOAGULATION CONSULT NOTE - Follow-up Consult  Pharmacy Consult for heparin and coumadin Indication: atrial fibrillation  No Known Allergies  Patient Measurements: Height: 5\' 4"  (162.6 cm) Weight: 197 lb 12.8 oz (89.721 kg) IBW/kg (Calculated) : 59.2 Heparin Dosing Weight: 80.2 kg  Vital Signs: Temp: 97.7 F (36.5 C) (02/14 0837) Temp Source: Oral (02/14 0837) BP: 119/79 mmHg (02/14 1003) Pulse Rate: 104 (02/14 1003)  Labs:  Recent Labs  12/29/15 1436  12/30/15 0300 12/30/15 1028 12/31/15 0220 01/01/16 0512  HGB 10.4*  --  9.6*  --  10.1* 10.2*  HCT 33.3*  --  31.2*  --  33.7* 33.8*  PLT 326  --  303  --  290 260  LABPROT 18.3*  --   --  18.0* 18.0* 23.6*  INR 1.52*  --   --  1.48 1.48 2.12*  HEPARINUNFRC  --   < > 0.26* 0.41 0.45 0.73*  CREATININE 2.08*  --  2.28*  --   --   --   < > = values in this interval not displayed.   Assessment: 55 yo male admitted with 2 day hx of SOB, orthopnea. On coumadin 5mg  daily exc 7.5mg  on Mon/Thurs PTA for Afib. INR on admit 1.5 and has been on heparin and warfarin bridge since admission. INR 2.1 this am. HL 0.73.   Goal of Therapy:  INR: 2-3 Monitor platelets by anticoagulation protocol: Yes   Plan:  1. Stop heparin 2. INR therapeutic after a few days of 7.5 mg dose; however, will give coumadin at lower dose tonight of 5 mg based on most recent trends 3. INR daily  Vincenza Hews, PharmD, BCPS 01/01/2016, 11:53 AM Pager: [phone]

## 2016-01-01 NOTE — Progress Notes (Signed)
MEDICATION RELATED NOTE   Pharmacy Re:  Michael [name] has attempted to verify home meds with family and with retail pharmacy.  The list below contains our best attempt.  We cannot verify for certain that he is compliant with everything on this list and when his last doses were.  Medications:  Prescriptions prior to admission  Medication Sig Dispense Refill Last Dose  . allopurinol (ZYLOPRIM) 100 MG tablet Take 1 tablet (100 mg total) by mouth 2 (two) times daily. 60 tablet 2 maybe 2/11  . furosemide (LASIX) 80 MG tablet Take 80 mg by mouth 2 (two) times daily.   maybe 2/11 at am  . isosorbide mononitrate (IMDUR) 30 MG 24 hr tablet Take 0.5 tablets (15 mg total) by mouth daily. (Patient taking differently: Take 15 mg by mouth daily with supper. ) 30 tablet 11 maybe 2/10  . metoprolol (LOPRESSOR) 100 MG tablet Take 100 mg by mouth 2 (two) times daily.   maybe 2/11 at 1000  . potassium chloride SA (K-DUR,KLOR-CON) 20 MEQ tablet Take 1 tablet (20 mEq total) by mouth daily. 30 tablet 11 maybe 2/11  . acetaminophen (TYLENOL) 325 MG tablet Take 650 mg by mouth every 6 (six) hours as needed for headache (pain).   Taking  . Blood Glucose Monitoring Suppl (ACCU-CHEK AVIVA) device Use as Instructed 3 times daily 1 each 0 Taking  . digoxin (LANOXIN) 0.125 MG tablet Take 1 tablet (0.125 mg total) by mouth daily. 30 tablet 11 unknown  . furosemide (LASIX) 40 MG tablet Take 2 tablets (80 mg total) by mouth 2 (two) times daily. (Patient not taking: Reported on 12/29/2015)   Not Taking at Unknown time  . glucose blood (ACCU-CHEK AVIVA) test strip Use 3 times daily as directed. 100 each 12 Taking  . hydrALAZINE (APRESOLINE) 10 MG tablet Take 1 tablet (10 mg total) by mouth 3 (three) times daily. 90 tablet 11 unknown  . insulin glargine (LANTUS) 100 UNIT/ML injection Inject 0.2 mLs (20 Units total) into the skin at bedtime. 10 mL 2 Taking  . Lancet Devices (ACCU-CHEK SOFTCLIX) lancets Use as instructed 3  times daily 1 each 5 Taking  . metoprolol succinate (TOPROL-XL) 100 MG 24 hr tablet Take 1 tablet (100 mg total) by mouth as directed. take 1 and 1/2 tablets twice daily (Patient not taking: Reported on 12/29/2015) 360 tablet 3 Not Taking at Unknown time  . naproxen (NAPROSYN) 500 MG tablet Take 500 mg by mouth 2 (two) times daily with a meal.   Taking  . Vitamin D, Ergocalciferol, (DRISDOL) 50000 UNITS CAPS capsule Take 50,000 Units by mouth every 7 (seven) days.   Taking  . warfarin (COUMADIN) 5 MG tablet Take 1 tablet (5 mg total) by mouth daily. (Patient taking differently: Take 5-7.5 mg by mouth at bedtime. Pre anti-coag visit 12/07/15: take 1 1/2 tablets (7.5 mg) by mouth on Monday and Thursday, take 1 tablet (5 mg) on Sunday, Tuesday, Wednesday, Friday, Saturday) 10 tablet 0 maybe 2/10    Plan:  I have marked his history as complete, please review and resume those medications you feel are most appropriate for this patient.  Rober Minion, PharmD., MS Clinical Pharmacist Pager:  [phone] Thank you for allowing pharmacy to be part of this patients care team. 01/01/2016,10:19 AM

## 2016-01-01 NOTE — Progress Notes (Signed)
Hospital Problem List     Principal Problem:   Acute hypoxemic respiratory failure (HCC) Active Problems:   CKD (chronic kidney disease), stage IV (HCC)   Essential hypertension   Mitral valve disease   Anemia - mild exacerbation   Chronic atrial fibrillation (HCC)   Dilated cardiomyopathy (HCC)   Acute respiratory failure [facility])    Patient Profile:   55 y.o. male with PMH of mitral valve disease (s/p MVR in 2012 at [facility] in [place]), tricuspid valve repair, CAD, chronic systolic dysfunction (EF [id number] - 35%), permanent atrial fibrillation, CKD, OSA, diabetes, HTN, HLD, and pulmonary hypertension admitted on 12/29/2015 for acute hypoxic respiratory failure.  Subjective   Reports his breathing is about the same. Still on 2L [place]. Denies any chest pain or palpitations.   Inpatient Medications    . allopurinol  100 mg Oral BID  . antiseptic oral rinse  7 mL Mouth Rinse BID  . bisacodyl  10 mg Rectal Daily  . digoxin  0.125 mg Oral Daily  . docusate sodium  200 mg Oral BID  . furosemide  80 mg Intravenous TID  . hydrALAZINE  25 mg Oral 3 times per day  . insulin aspart  0-5 Units Subcutaneous QHS  . insulin aspart  0-9 Units Subcutaneous TID WC  . insulin glargine  20 Units Subcutaneous QHS  . isosorbide mononitrate  15 mg Oral Daily  . metoprolol tartrate  100 mg Oral BID  . potassium chloride SA  20 mEq Oral Daily  . sodium chloride flush  3 mL Intravenous Q12H  . Vitamin D (Ergocalciferol)  50,000 Units Oral Q7 days  . warfarin  5 mg Oral ONCE-1800  . Warfarin - Pharmacist Dosing Inpatient   Does not apply q1800    Vital Signs    Filed Vitals:   01/01/16 0525 01/01/16 0837 01/01/16 1000 01/01/16 1003  BP: 119/79 120/67  119/79  Pulse: 79 70 71 104  Temp: 97.8 F (36.6 C) 97.7 F (36.5 C)    TempSrc: Oral Oral    Resp: 21 20    Height:      Weight: 197 lb 12.8 oz (89.721 kg)     SpO2: 100% 100%      Intake/Output Summary (Last 24 hours) at 01/01/16 1427 Last  data filed at 01/01/16 1351  Gross per 24 hour  Intake 1189.5 ml  Output   2785 ml  Net -1595.5 ml   Filed Weights   12/31/15 0500 12/31/15 2251 01/01/16 0525  Weight: 201 lb 6.4 oz (91.354 kg) 199 lb 11.2 oz (90.583 kg) 197 lb 12.8 oz (89.721 kg)    Physical Exam    General: Well developed, well nourished, male in no acute distress. Head: Normocephalic, atraumatic.  Neck: Supple without bruits, JVD elevated to 8cm. Lungs:  Resp regular and unlabored, coarse breath sounds throughout with mild expiratory wheeze. Heart: Irregularly irregular, S1, S2, no S3, S4, or murmur; no rub. Abdomen: Soft, non-tender, non-distended with normoactive bowel sounds. No hepatomegaly. No rebound/guarding. No obvious abdominal masses. Extremities: No clubbing or cyanosis.  trace edema bilaterally up to mid-shins. Distal pedal pulses are 2+ bilaterally. Neuro: Alert and oriented X 3. Moves all extremities spontaneously. Psych: Normal affect.  Labs    CBC  Recent Labs  12/31/15 0220 01/01/16 0512  WBC 6.4 6.3  HGB 10.1* 10.2*  HCT 33.7* 33.8*  MCV 69.1* 69.0*  PLT 290 123456   Basic Metabolic Panel  Recent Labs  12/29/15 1436 12/30/15  0300  NA 140 140  K 4.9 4.2  CL 104 105  CO2 24 27  GLUCOSE 171* 109*  BUN 61* 61*  CREATININE 2.08* 2.28*  CALCIUM 9.0 8.8*   Hemoglobin A1C  Recent Labs  12/29/15 2031  HGBA1C 7.3*    Telemetry    Atrial fibrillation, rate-controlled in the 80's - 90's.   ECG    No new tracings.   Cardiac Studies and Radiology    Dg Chest 2 View: 12/31/2015  CLINICAL DATA:  Shortness of breath with cough and chest congestion. EXAM: CHEST  2 VIEW COMPARISON:  12/29/2015 FINDINGS: Chronic cardiomegaly. Pulmonary vascularity is normal and the lungs are clear. No effusions. Calcification in the thoracic aorta. Prior median sternotomy. IMPRESSION: No acute abnormalities.  Chronic cardiomegaly. Electronically Signed   By: [name] M.D.   On: 12/31/2015 08:38    Dg Chest 2 View: 12/29/2015  CLINICAL DATA:  Three-day history of shortness of breath. Cough and wheezing EXAM: CHEST  2 VIEW COMPARISON:  October 10, 2015 FINDINGS: There is no edema or consolidation. There is mild cardiomegaly with pulmonary vascularity within normal limits. No adenopathy. Patient is status post coronary artery bypass grafting. No bone lesions are apparent. IMPRESSION: Stable cardiac enlargement.  No edema or consolidation. Electronically Signed   By: Lowella Grip III M.D.   On: 12/29/2015 15:47   Echocardiogram: 09/11/2015 Study Conclusions - Left ventricle: The cavity size was normal. Wall thickness was increased in a pattern of mild LVH. Systolic function was moderately to severely reduced. The estimated ejection fraction was in the range of 30% to 35%. - Ventricular septum: The contour showed diastolic flattening. - Mitral valve: A bioprosthesis was present. Valve area by continuity equation (using LVOT flow): 0.89 cm^2. - Left atrium: The atrium was severely dilated. - Right ventricle: The cavity size was mildly dilated. Systolic function was mildly to moderately reduced. - Right atrium: The atrium was moderately dilated.  Assessment & Plan    1. Acute Hypoxic Respiratory Failure - Symptoms are likely primarily due to URI, especially with his productive cough. CXR without any acute findings. - currently on Azithromycin.  - per admitting team  2. Acute on chronic systolic HF - Echo in [id number] showed EF of [id number] with diastolic flattening of the ventricular septum. - Weight 203 lbs on admission, currently at 201 lbs on 12/31/2015. Discharge weight was 192 in 10/2015 yet his weight is the office has been greater than 200 lbs since. Was 202 lbs in 11/2015, 209 lbs on 12/25/2015.Continue to obtain daily weights. - Net output of -3.1L thus far. Was on Lasix 80mg  PO BID as an outpatient. Scheduled to receive IV Lasix 80mg  TID today. CXR showed no evidence of  fluid overload yet he does have elevated JVD and lower extremity edema on exam. Will likely need to decrease his IV Lasix dose back to 80mg  BID or once daily tomorrow pending his creatinine in the AM. - continue BB. No ACE-I/ARB due to Stage 4 CKD.  3. Chronic renal failure- stage IV - baseline creatinine 2.0. - elevated to 2.19 on admission. 2.28 on 12/30/2015. - continue to monitor with IV diuresis.  4. Permanent atrial fibrillation - s/p MAZE procedure in 2011 at the time of his MVR - Rates are reasonably controlled with Digoxin 0.125mg  and Lopressor 75mg  BID. BP stable on current Lopressor dose. - This patients CHA2DS2-VASc Score and unadjusted Ischemic Stroke Rate (% per year) is equal to 4.8 % stroke rate/year from a  score of 4 (CHF, HTN, Vascular, DM). Resuming Coumadin per pharmacy dosing with Heparin bridge.  5. Mitral valve disease - s/p MVR in [place].  Plan: Check BMP in am and QD x 3.   Henri Medal , PA-C 2:27 PM 01/01/2016 Pager: [phone]   I have seen and examined the patient along with [facility] K , PA-C.  I have reviewed the chart, notes and new data.  I agree with PA's note.  Not much progress with diuresis or rate control. No labs today.  PLAN: Recheck labs in AM before any additional med changes  Sanda Klein, MD, [place] [phone] 01/01/2016, 2:38 PM

## 2016-01-02 DIAGNOSIS — D509 Iron deficiency anemia, unspecified: Secondary | ICD-10-CM

## 2016-01-02 LAB — PROTIME-INR
INR: 2.5 — AB (ref 0.00–1.49)
PROTHROMBIN TIME: 26.7 s — AB (ref 11.6–15.2)

## 2016-01-02 LAB — CBC
HEMATOCRIT: 34.3 % — AB (ref 39.0–52.0)
HEMOGLOBIN: 10.6 g/dL — AB (ref 13.0–17.0)
MCH: 21.6 pg — ABNORMAL LOW (ref 26.0–34.0)
MCHC: 30.9 g/dL (ref 30.0–36.0)
MCV: 70 fL — ABNORMAL LOW (ref 78.0–100.0)
Platelets: 215 10*3/uL (ref 150–400)
RBC: 4.9 MIL/uL (ref 4.22–5.81)
RDW: 19.3 % — AB (ref 11.5–15.5)
WBC: 6.5 10*3/uL (ref 4.0–10.5)

## 2016-01-02 LAB — BASIC METABOLIC PANEL
Anion gap: 11 (ref 5–15)
BUN: 60 mg/dL — ABNORMAL HIGH (ref 6–20)
CO2: 34 mmol/L — ABNORMAL HIGH (ref 22–32)
Calcium: 9.2 mg/dL (ref 8.9–10.3)
Chloride: 89 mmol/L — ABNORMAL LOW (ref 101–111)
Creatinine, Ser: 2.05 mg/dL — ABNORMAL HIGH (ref 0.61–1.24)
GFR calc Af Amer: 40 mL/min — ABNORMAL LOW (ref >60)
GFR calc non Af Amer: 35 mL/min — ABNORMAL LOW (ref >60)
Glucose, Bld: 124 mg/dL — ABNORMAL HIGH (ref 65–99)
Potassium: 3.8 mmol/L (ref 3.5–5.1)
Sodium: 134 mmol/L — ABNORMAL LOW (ref 135–145)

## 2016-01-02 LAB — GLUCOSE, CAPILLARY
GLUCOSE-CAPILLARY: 91 mg/dL (ref 65–99)
GLUCOSE-CAPILLARY: 166 mg/dL — AB (ref 65–99)
Glucose-Capillary: 118 mg/dL — ABNORMAL HIGH (ref 65–99)
Glucose-Capillary: 145 mg/dL — ABNORMAL HIGH (ref 65–99)

## 2016-01-02 MED ORDER — FLUTICASONE PROPIONATE 50 MCG/ACT NA SUSP: 2.0000 | Freq: Every day | NASAL | Status: DC

## 2016-01-02 MED ORDER — FUROSEMIDE 80 MG PO TABS
80.0000 mg | ORAL_TABLET | Freq: Two times a day (BID) | ORAL | Status: DC
  Administered 2016-01-02 – 2016-01-04 (×4): 80 mg via ORAL
  Filled 2016-01-02 (×4): qty 1

## 2016-01-02 MED ORDER — WARFARIN SODIUM 5 MG PO TABS
5.0000 mg | ORAL_TABLET | Freq: Once | ORAL | Status: AC
  Administered 2016-01-02: 5 mg via ORAL
  Filled 2016-01-02: qty 1

## 2016-01-02 MED ORDER — CEFUROXIME AXETIL 500 MG PO TABS
500.0000 mg | ORAL_TABLET | Freq: Two times a day (BID) | ORAL | Status: DC
  Administered 2016-01-02 – 2016-01-04 (×5): 500 mg via ORAL
  Filled 2016-01-02 (×5): qty 1

## 2016-01-02 MED ORDER — GUAIFENESIN ER 600 MG PO TB12
600.0000 mg | ORAL_TABLET | Freq: Two times a day (BID) | ORAL | Status: DC
  Administered 2016-01-02 – 2016-01-04 (×5): 600 mg via ORAL
  Filled 2016-01-02 (×5): qty 1

## 2016-01-02 MED ORDER — METOPROLOL TARTRATE 100 MG PO TABS
100.0000 mg | ORAL_TABLET | Freq: Two times a day (BID) | ORAL | Status: DC
Start: 2016-01-02 — End: 2016-01-04
  Administered 2016-01-02 – 2016-01-04 (×4): 100 mg via ORAL
  Filled 2016-01-02 (×4): qty 1

## 2016-01-02 MED ORDER — BUDESONIDE-FORMOTEROL FUMARATE 80-4.5 MCG/ACT IN AERO
2.0000 | INHALATION_SPRAY | Freq: Two times a day (BID) | RESPIRATORY_TRACT | Status: DC
  Administered 2016-01-02 – 2016-01-04 (×4): 2 via RESPIRATORY_TRACT
  Filled 2016-01-02: qty 6.9

## 2016-01-02 MED ORDER — METOPROLOL TARTRATE 50 MG PO TABS: 50.0000 mg | ORAL_TABLET | Freq: Two times a day (BID) | ORAL | Status: DC

## 2016-01-02 MED ORDER — LEVALBUTEROL HCL 0.63 MG/3ML IN NEBU
0.6300 mg | INHALATION_SOLUTION | Freq: Four times a day (QID) | RESPIRATORY_TRACT | Status: DC
  Administered 2016-01-02 – 2016-01-04 (×9): 0.63 mg via RESPIRATORY_TRACT
  Filled 2016-01-02 (×9): qty 3

## 2016-01-02 NOTE — Progress Notes (Signed)
[facility]                                                                              Patient Demographics  Michael [name], is a 55 y.o. male, DOB - 05-01-1961, YS:[id number]  Admit date - 12/29/2015   Admitting Physician Thurnell Lose, MD  Outpatient Primary MD for the patient is No PCP Per Patient  LOS - 4   Chief Complaint  Patient presents with  . Shortness of Breath       Brief HPI   Michael [name] is a 55 y.o. 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 In ER workup was consistent with atrial fibrillation with RVR, acute hypoxic respiratory failure due to acute on chronic systolic CHF and possible URI   Assessment & Plan    Principal Problem:   Acute hypoxemic respiratory failure (HCC) due to acute on chronic systolic CHF, dilated cardiomyopathy, URI - Chest x-ray showed no acute findings - 2-D echo in 08/2015 showed EF of 99991111 with diastolic flattening of the ventricular septum. - Continue IV diuresis, negative balance of 4.6 L -Continue beta blocker, no ACEI/ARB due to stage IV CK D    Active Problems: Acute bronchitis - Continue Xopenex, added Symbicort, Ceftin - Flutter valve, mucinex    CKD (chronic kidney  disease), stage IV (HCC) - Baseline creatinine 2.0 - Continue IV diuresis, creatinine improving today    Essential hypertension - BP stable, continue Lasix, beta blocker  Atrial fibrillation: s/p MAZE procedure in 2011 - Continue digoxin, Lopressor - CHADSvasc 4, Coumadin per pharmacy with heparin bridge  Mitral valve disease - s/p MVR   Diabetes mellitus  - Continue sliding scale insulin   Code Status:Full CODE STATUS   Family Communication: Discussed in detail with the patient, all imaging results, lab results explained to the patient  Disposition Plan:   Time Spent in minutes  25  minutes  Procedures  None  Consults   Cardiology  DVT Prophylaxis warfarin  Medications  Scheduled Meds: . allopurinol  100 mg Oral BID  . antiseptic oral rinse  7 mL Mouth Rinse BID  . bisacodyl  10 mg Rectal Daily  . digoxin  0.125 mg Oral Daily  . docusate sodium  200 mg Oral BID  . furosemide  80 mg Oral BID  . hydrALAZINE  25 mg Oral 3 times per day  . insulin aspart  0-5 Units Subcutaneous QHS  .  insulin aspart  0-9 Units Subcutaneous TID WC  . insulin glargine  20 Units Subcutaneous QHS  . isosorbide mononitrate  15 mg Oral Daily  . levalbuterol  0.63 mg Nebulization Q6H  . metoprolol tartrate  50 mg Oral BID  . potassium chloride SA  20 mEq Oral Daily  . sodium chloride flush  3 mL Intravenous Q12H  . Vitamin D (Ergocalciferol)  50,000 Units Oral Q7 days  . warfarin  5 mg Oral ONCE-1800  . Warfarin - Pharmacist Dosing Inpatient   Does not apply q1800   Continuous Infusions:  PRN Meds:.acetaminophen, guaiFENesin-dextromethorphan, HYDROcodone-acetaminophen, metoprolol, [DISCONTINUED] ondansetron **OR** ondansetron (ZOFRAN) IV, sodium chloride   Antibiotics   Anti-infectives    Start     Dose/Rate Route Frequency Ordered Stop   12/29/15 1845  azithromycin (ZITHROMAX) tablet 500 mg  Status:  Discontinued     500 mg Oral Daily 12/29/15 1837 01/01/16 1030         Subjective:   Michael [name] was seen and examined today.  Denies any chest pain. Still has shortness of breath. Patient denies dizziness,  no fevers or chills. Denies abdominal pain, N/V/D/C, new weakness, numbess, tingling. No acute events overnight.    Objective:   Filed Vitals:   01/01/16 2054 01/02/16 0031 01/02/16 0436 01/02/16 1209  BP: 118/62 116/64 118/70 107/72  Pulse: 93 90 88 79  Temp: 97.5 F (36.4 C) 97.6 F (36.4 C) 97.3 F (36.3 C) 97.7 F (36.5 C)  TempSrc: Oral Oral Oral Oral  Resp: 20 18 20 20   Height:      Weight:   89.7 kg (197 lb 12 oz)   SpO2: 100% 100% 100% 94%    Intake/Output Summary (Last 24 hours) at 01/02/16 1437 Last data filed at 01/02/16 1423  Gross per 24 hour  Intake   1440 ml  Output   2900 ml  Net  -1460 ml     Wt Readings from Last 3 Encounters:  01/02/16 89.7 kg (197 lb 12 oz)  12/26/15 94.802 kg (209 lb)  12/07/15 91.627 kg (202 lb)     Exam  General: Alert and oriented x 3, NAD  HEENT:  PERRLA, EOMI, Anicteric Sclera, mucous membranes moist.   Neck: Supple, no JVD, no masses  CVS: S1 S2 auscultated,  irregularly irregular   Respiratory: Mild expiratory wheezing bilaterally  Abdomen: Soft, nontender, nondistended, + bowel sounds  Ext: no cyanosis clubbing, 1+ edema  Neuro: AAOx3, Cr N's II- XII. Strength 5/5 upper and lower extremities bilaterally  Skin: No rashes  Psych: Normal affect and demeanor, alert and oriented x3    Data Review   Micro Results Recent Results (from the past 240 hour(s))  MRSA PCR Screening     Status: None   Collection Time: 12/29/15  8:24 PM  Result Value Ref Range Status   MRSA by PCR NEGATIVE NEGATIVE Final    Comment:        The GeneXpert MRSA Assay (FDA approved for NASAL specimens only), is one component of a comprehensive MRSA colonization surveillance program. It is not intended to diagnose MRSA infection nor to guide or monitor treatment for MRSA  infections.     Radiology Reports Dg Chest 2 View  12/31/2015  CLINICAL DATA:  Shortness of breath with cough and chest congestion. EXAM: CHEST  2 VIEW COMPARISON:  12/29/2015 FINDINGS: Chronic cardiomegaly. Pulmonary vascularity is normal and the lungs are clear. No effusions. Calcification in the thoracic aorta. Prior median sternotomy. IMPRESSION: No  acute abnormalities.  Chronic cardiomegaly. Electronically Signed   By: [name] M.D.   On: 12/31/2015 08:38   Dg Chest 2 View  12/29/2015  CLINICAL DATA:  Three-day history of shortness of breath. Cough and wheezing EXAM: CHEST  2 VIEW COMPARISON:  October 10, 2015 FINDINGS: There is no edema or consolidation. There is mild cardiomegaly with pulmonary vascularity within normal limits. No adenopathy. Patient is status post coronary artery bypass grafting. No bone lesions are apparent. IMPRESSION: Stable cardiac enlargement.  No edema or consolidation. Electronically Signed   By: Lowella Grip III M.D.   On: 12/29/2015 15:47   Dg Abd Portable 1v  01/01/2016  CLINICAL DATA:  Nausea, abdominal discomfort, chronic renal insufficiency. EXAM: PORTABLE ABDOMEN - 1 VIEW COMPARISON:  Renal ultrasound of September 11, 2015 FINDINGS: The bowel gas pattern is within the limits of normal. No free extraluminal gas collections are observed. There is gas in the stomach. There is a coarse calcific density that projects in the left mid abdomen. The bony structures exhibit no acute abnormalities. IMPRESSION: No acute intra-abdominal abnormality is observed. A coarse calcification in the left mid abdomen may lie within the kidney but none was demonstrated on the previous renal ultrasound. If the patient's symptoms warrant further imaging, abdominal and pelvic CT scanning would be a useful next imaging modality. Electronically Signed   By: David  Martinique M.D.   On: 01/01/2016 09:10    CBC  Recent Labs Lab 12/29/15 1436 12/30/15 0300 12/31/15 0220  01/01/16 0512 01/02/16 0546  WBC 6.0 6.6 6.4 6.3 6.5  HGB 10.4* 9.6* 10.1* 10.2* 10.6*  HCT 33.3* 31.2* 33.7* 33.8* 34.3*  PLT 326 303 290 260 215  MCV 68.5* 68.3* 69.1* 69.0* 70.0*  MCH 21.4* 21.0* 20.7* 20.8* 21.6*  MCHC 31.2 30.8 30.0 30.2 30.9  RDW 18.8* 18.8* 18.8* 19.0* 19.3*    Chemistries   Recent Labs Lab 12/26/15 1705 12/29/15 1436 12/30/15 0300 01/02/16 0546  NA 135 140 140 134*  K 4.4 4.9 4.2 3.8  CL 102 104 105 89*  CO2 24 24 27  34*  GLUCOSE 116* 171* 109* 124*  BUN 60* 61* 61* 60*  CREATININE 2.19* 2.08* 2.28* 2.05*  CALCIUM 9.3 9.0 8.8* 9.2   ------------------------------------------------------------------------------------------------------------------ estimated creatinine clearance is 40.6 mL/min (by C-G formula based on Cr of 2.05). ------------------------------------------------------------------------------------------------------------------ No results for input(s): HGBA1C in the last 72 hours. ------------------------------------------------------------------------------------------------------------------ No results for input(s): CHOL, HDL, LDLCALC, TRIG, CHOLHDL, LDLDIRECT in the last 72 hours. ------------------------------------------------------------------------------------------------------------------ No results for input(s): TSH, T4TOTAL, T3FREE, THYROIDAB in the last 72 hours.  Invalid input(s): FREET3 ------------------------------------------------------------------------------------------------------------------ No results for input(s): VITAMINB12, FOLATE, FERRITIN, TIBC, IRON, RETICCTPCT in the last 72 hours.  Coagulation profile  Recent Labs Lab 12/29/15 1436 12/30/15 1028 12/31/15 0220 01/01/16 0512 01/02/16 0546  INR 1.52* 1.48 1.48 2.12* 2.50*    No results for input(s): DDIMER in the last 72 hours.  Cardiac Enzymes No results for input(s): CKMB, TROPONINI, MYOGLOBIN in the last 168 hours.  Invalid input(s):  CK ------------------------------------------------------------------------------------------------------------------ Invalid input(s): [place]  01/01/16 0548 01/01/16 1257 01/01/16 1653 01/01/16 2111 01/02/16 0627 01/02/16 1137  GLUCAP 111* 151* 28 118* 91 118*     Danee Soller M.D. [facility] 01/02/2016, 2:37 PM  Pager: [phone] Between 7am to 7pm - call Pager - [phone]  After 7pm go to [email or web address] - password [email or web address]  Call night coverage person covering after 7pm

## 2016-01-02 NOTE — Hospital Discharge Follow-Up (Addendum)
Transitional Care Clinic Care Coordination Note:  Admit date:  12/29/15 Discharge date: TBD Discharge Disposition: Home when stable Patient contact: [phone] Emergency contact(s): none  This Case Manager reviewed patient's EMR and determined patient would benefit from post-discharge medical management and chronic care management services through the [facility]. Patient has a history of atrial fibrillation, chronic systolic heart failure, type 2 DM, OSA, CKD Stage IV, asthma who is receiving treatment for acute hypoxic respiratory failure due to acute on chronic systolic CHF. Patient has had three inpatient admissions and 1 ED visit in the last six months. This Case Manager met with patient to discuss the services and medical management that can be provided at the [facility]. Laot[place] interpreter # [phone] used for language translation. Patient verbalized understanding and agreed to receive post-discharge care at the [facility].   Patient scheduled for Transitional Care appointment on 01/10/16 at 1030 with Dr. Jarold Song. Appointment on 01/10/16 at 1130 with Nicoletta Ba, Clinical Pharmacist at [facility] and [facility]. Clinic information and appointment time provided to patient. Appointment information also placed on AVS.  Assessment:       Home Environment: Patient lives alone in a private residence.       Support System: children       Level of functioning: Independent       Home DME: none. Patient also denies having a glucometer.  Voicemail left for Olga Coaster, RN CM informing of need for glucometer and diabetes monitoring supplies. Informed her that pt may also benefit from meeting with Diabetes Educator.       Home care services: none       Transportation: Patient typically drives to his medical appointments.        Food/Nutrition: Patient cooks his own meals. No barriers identified with obtaining or affording needed food.  Medications: Patient indicates he gets his medications from [facility]. He indicates he fills his own pill box. Would benefit from meeting with Clinical Pharmacist at [facility] and [facility] for medication management, diabetes education. Appointment scheduled for 01/10/16 at 1130 with Nicoletta Ba.        Identified Barriers: language barrier, medical noncompliance        PCP: Chari Manning, NP ([place])              Arranged services:        Services communicated to: Voicemail left for Olga Coaster, RN CM   Addendum 1530-Received call back from Olga Coaster, RN CM who indicated scripts for glucometer and diabetes monitoring supplies would be sent to [facility] at discharge. She also indicated that diabetes educator had been consulted.

## 2016-01-02 NOTE — Progress Notes (Signed)
Hospital Problem List     Principal Problem:   Acute hypoxemic respiratory failure (HCC) Active Problems:   CKD (chronic kidney disease), stage IV (HCC)   Essential hypertension   Mitral valve disease   Anemia - mild exacerbation   Chronic atrial fibrillation (HCC)   Dilated cardiomyopathy (HCC)   Acute respiratory failure ([place])    Patient Profile:   55 y.o.obese male with PMH of mitral valve disease (s/p MVR in 2012 at [facility] in [place]), tricuspid valve repair, CAD, chronic systolic dysfunction (EF [id number] - 35%), permanent atrial fibrillation, CKD, OSA, diabetes, HTN, HLD, and pulmonary hypertension admitted on 12/29/2015 for acute hypoxic respiratory failure.  Subjective   Reports his breathing is about the same. Still on 2L [place]. Denies any chest pain or palpitations.   Inpatient Medications    . allopurinol  100 mg Oral BID  . antiseptic oral rinse  7 mL Mouth Rinse BID  . bisacodyl  10 mg Rectal Daily  . digoxin  0.125 mg Oral Daily  . docusate sodium  200 mg Oral BID  . furosemide  80 mg Intravenous TID  . hydrALAZINE  25 mg Oral 3 times per day  . insulin aspart  0-5 Units Subcutaneous QHS  . insulin aspart  0-9 Units Subcutaneous TID WC  . insulin glargine  20 Units Subcutaneous QHS  . isosorbide mononitrate  15 mg Oral Daily  . metoprolol tartrate  100 mg Oral BID  . potassium chloride SA  20 mEq Oral Daily  . sodium chloride flush  3 mL Intravenous Q12H  . Vitamin D (Ergocalciferol)  50,000 Units Oral Q7 days  . warfarin  5 mg Oral ONCE-1800  . Warfarin - Pharmacist Dosing Inpatient   Does not apply q1800    Vital Signs    Filed Vitals:   01/01/16 2054 01/02/16 0031 01/02/16 0436 01/02/16 1209  BP: 118/62 116/64 118/70 107/72  Pulse: 93 90 88 79  Temp: 97.5 F (36.4 C) 97.6 F (36.4 C) 97.3 F (36.3 C) 97.7 F (36.5 C)  TempSrc: Oral Oral Oral Oral  Resp: 20 18 20 20   Height:      Weight:   197 lb 12 oz (89.7 kg)   SpO2: 100% 100% 100% 94%     Intake/Output Summary (Last 24 hours) at 01/02/16 1352 Last data filed at 01/02/16 0919  Gross per 24 hour  Intake   1200 ml  Output   2575 ml  Net  -1375 ml   Filed Weights   12/31/15 2251 01/01/16 0525 01/02/16 0436  Weight: 199 lb 11.2 oz (90.583 kg) 197 lb 12.8 oz (89.721 kg) 197 lb 12 oz (89.7 kg)    Physical Exam    General: Obese male in no acute distress. Head: Normocephalic, atraumatic.  Neck: Supple without bruits, JVD elevated to 8cm. Lungs:  Resp regular and unlabored, coarse breath sounds throughout with mild expiratory wheeze. Heart: Irregularly irregular, S1, S2, no S3, S4, or murmur; no rub. Abdomen: Soft, non-tender, non-distended with normoactive bowel sounds. No hepatomegaly. No rebound/guarding. No obvious abdominal masses. Extremities: No clubbing or cyanosis.  trace edema bilaterally up to mid-shins. Distal pedal pulses are 2+ bilaterally. Neuro: Alert and oriented X 3. Moves all extremities spontaneously. Psych: Normal affect.  Labs    CBC  Recent Labs  01/01/16 0512 01/02/16 0546  WBC 6.3 6.5  HGB 10.2* 10.6*  HCT 33.8* 34.3*  MCV 69.0* 70.0*  PLT 260 [id number]   Basic Metabolic Panel  Recent Labs  01/02/16 0546  NA 134*  K 3.8  CL 89*  CO2 34*  GLUCOSE 124*  BUN 60*  CREATININE 2.05*  CALCIUM 9.2   Hemoglobin A1C No results for input(s): HGBA1C in the last 72 hours.  Telemetry    Atrial fibrillation, rate-controlled in the 80's - 90's.   ECG    No new tracings.   Cardiac Studies and Radiology    Dg Chest 2 View: 12/31/2015  CLINICAL DATA:  Shortness of breath with cough and chest congestion. EXAM: CHEST  2 VIEW COMPARISON:  12/29/2015 FINDINGS: Chronic cardiomegaly. Pulmonary vascularity is normal and the lungs are clear. No effusions. Calcification in the thoracic aorta. Prior median sternotomy. IMPRESSION: No acute abnormalities.  Chronic cardiomegaly. Electronically Signed   By: [name] M.D.   On: 12/31/2015 08:38    Dg Chest 2 View: 12/29/2015  CLINICAL DATA:  Three-day history of shortness of breath. Cough and wheezing EXAM: CHEST  2 VIEW COMPARISON:  October 10, 2015 FINDINGS: There is no edema or consolidation. There is mild cardiomegaly with pulmonary vascularity within normal limits. No adenopathy. Patient is status post coronary artery bypass grafting. No bone lesions are apparent. IMPRESSION: Stable cardiac enlargement.  No edema or consolidation. Electronically Signed   By: Lowella Grip III M.D.   On: 12/29/2015 15:47   Echocardiogram: 09/11/2015 Study Conclusions - Left ventricle: The cavity size was normal. Wall thickness was increased in a pattern of mild LVH. Systolic function was moderately to severely reduced. The estimated ejection fraction was in the range of 30% to 35%. - Ventricular septum: The contour showed diastolic flattening. - Mitral valve: A bioprosthesis was present. Valve area by continuity equation (using LVOT flow): 0.89 cm^2. - Left atrium: The atrium was severely dilated. - Right ventricle: The cavity size was mildly dilated. Systolic function was mildly to moderately reduced. - Right atrium: The atrium was moderately dilated.  Assessment & Plan    1. Acute Hypoxic Respiratory Failure - Symptoms are likely primarily due to URI, especially with his productive cough. CXR without any acute findings. - currently on Azithromycin.  - per admitting team  2. Acute on chronic systolic HF - Echo in [id number] showed EF of 99991111 with diastolic flattening of the ventricular septum. - Weight 203 lbs on admission, currently at 201 lbs on 12/31/2015. Discharge weight was 192 in 10/2015 yet his weight is the office has been greater than 200 lbs since. Was 202 lbs in 11/2015, 209 lbs on 12/25/2015.Continue to obtain daily weights. - Net output of -4.5L thus far. Wgt today 197 lbs. Was on Lasix 80mg  PO BID as an outpatient. Scheduled to receive IV Lasix 80mg  TID today.  -  continue BB. No ACE-I/ARB due to Stage 4 CKD.  3. Chronic renal failure- stage IV - baseline creatinine 2.0. - elevated to 2.19 on admission. 2.28 on 12/30/2015. - continue to monitor with IV diuresis.  4. Permanent atrial fibrillation - s/p MAZE procedure in 2011 at the time of his MVR - Rates are reasonably controlled with Digoxin 0.125mg  and Lopressor 75mg  BID. BP stable on current Lopressor dose. - This patients CHA2DS2-VASc Score and unadjusted Ischemic Stroke Rate (% per year) is equal to 4.8 % stroke rate/year from a score of 4 (CHF, HTN, Vascular, DM). Resuming Coumadin per pharmacy dosing with Heparin bridge.  5. Mitral valve disease - s/p MVR in [place].  Plan: Check BMP in am and QD x 3. Change Lasix to 80 mg PO BID.  Rx of URI per primary team but will add Xopenex HHN today to see if he doesn't have improvement. He told me he is on chronic O2 at home. Im not sure he should be on Lopressor 100 mg BID with URI/ wheezing- will decrease to 50 mg BID, consider adding Diltiazem low dose if needed for rate control. MD to see.  SignedErlene Quan , PA-C 1:52 PM 01/02/2016 Pager: [phone]   I have seen and examined the patient along with [facility] K , PA-C.  I have reviewed the chart, notes and new data.  I agree with PA's note.  Key new complaints: no real subjective change Key examination changes: rate control is fair, not ideal; no rales   PLAN: I would avoid diltiazem due to depressed LVEF and it is not safe to give more digoxin with his renal function. Rate control is essential in the setting of an intrinsically stenotic MV prosthesis. 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  I agree that there is probably no room for additional increase in diuretic.   Sanda Klein, MD,  [place] [phone] 01/02/2016, 4:40 PM

## 2016-01-02 NOTE — Progress Notes (Signed)
ANTICOAGULATION CONSULT NOTE - Follow-up Consult  Pharmacy Consult for coumadin Indication: atrial fibrillation  No Known Allergies  Patient Measurements: Height: 5\' 4"  (162.6 cm) Weight: 197 lb 12 oz (89.7 kg) (Scale B) IBW/kg (Calculated) : 59.2 Heparin Dosing Weight: 80.2 kg  Vital Signs: Temp: 97.3 F (36.3 C) (02/15 0436) Temp Source: Oral (02/15 0436) BP: 118/70 mmHg (02/15 0436) Pulse Rate: 88 (02/15 0436)  Labs:  Recent Labs  12/30/15 1028  12/31/15 0220 01/01/16 0512 01/02/16 0546  HGB  --   < > 10.1* 10.2* 10.6*  HCT  --   --  33.7* 33.8* 34.3*  PLT  --   --  290 260 PENDING  LABPROT 18.0*  --  18.0* 23.6* 26.7*  INR 1.48  --  1.48 2.12* 2.50*  HEPARINUNFRC 0.41  --  0.45 0.73*  --   CREATININE  --   --   --   --  2.05*  < > = values in this interval not displayed.   Assessment: 55 yo male admitted with 2 day hx of SOB, orthopnea. On coumadin 5mg  daily exc 7.5mg  on Mon/Thurs PTA for Afib. INR 2.5 this morning. H/H stable, PLTs "pending" and no sxs of bleeding.   Goal of Therapy:  INR: 2-3 Monitor platelets by anticoagulation protocol: Yes   Plan:  1. Warfarin 5 mg x 1 tonight 2. Long term will likely need alternating dose of 7.5 mg and 5 mg similar to PTA dose  3. INR daily  Vincenza Hews, PharmD, BCPS 01/02/2016, 8:10 AM Pager: [phone]

## 2016-01-03 DIAGNOSIS — J45909 Unspecified asthma, uncomplicated: Secondary | ICD-10-CM

## 2016-01-03 LAB — BASIC METABOLIC PANEL
Anion gap: 11 (ref 5–15)
BUN: 62 mg/dL — ABNORMAL HIGH (ref 6–20)
CO2: 35 mmol/L — ABNORMAL HIGH (ref 22–32)
Calcium: 9 mg/dL (ref 8.9–10.3)
Chloride: 91 mmol/L — ABNORMAL LOW (ref 101–111)
Creatinine, Ser: 2.12 mg/dL — ABNORMAL HIGH (ref 0.61–1.24)
GFR calc Af Amer: 38 mL/min — ABNORMAL LOW (ref >60)
GFR calc non Af Amer: 33 mL/min — ABNORMAL LOW (ref >60)
Glucose, Bld: 162 mg/dL — ABNORMAL HIGH (ref 65–99)
Potassium: 3.9 mmol/L (ref 3.5–5.1)
Sodium: 137 mmol/L (ref 135–145)

## 2016-01-03 LAB — GLUCOSE, CAPILLARY
GLUCOSE-CAPILLARY: 82 mg/dL (ref 65–99)
GLUCOSE-CAPILLARY: 115 mg/dL — AB (ref 65–99)
Glucose-Capillary: 144 mg/dL — ABNORMAL HIGH (ref 65–99)
Glucose-Capillary: 203 mg/dL — ABNORMAL HIGH (ref 65–99)

## 2016-01-03 LAB — CBC
HCT: 32.9 % — ABNORMAL LOW (ref 39.0–52.0)
HEMOGLOBIN: 10 g/dL — AB (ref 13.0–17.0)
MCH: 21 pg — AB (ref 26.0–34.0)
MCHC: 30.4 g/dL (ref 30.0–36.0)
MCV: 69.1 fL — ABNORMAL LOW (ref 78.0–100.0)
PLATELETS: 216 10*3/uL (ref 150–400)
RBC: 4.76 MIL/uL (ref 4.22–5.81)
RDW: 19.1 % — ABNORMAL HIGH (ref 11.5–15.5)
WBC: 8.2 10*3/uL (ref 4.0–10.5)

## 2016-01-03 LAB — PROTIME-INR
INR: 2.69 — ABNORMAL HIGH (ref 0.00–1.49)
PROTHROMBIN TIME: 28.2 s — AB (ref 11.6–15.2)

## 2016-01-03 MED ORDER — WARFARIN SODIUM 5 MG PO TABS: 5.0000 mg | ORAL_TABLET | ORAL | Status: DC

## 2016-01-03 MED ORDER — WARFARIN SODIUM 5 MG PO TABS
5.0000 mg | ORAL_TABLET | ORAL | Status: DC
  Filled 2016-01-03: qty 1

## 2016-01-03 MED ORDER — WARFARIN SODIUM 7.5 MG PO TABS: 7.5000 mg | ORAL_TABLET | ORAL | Status: DC

## 2016-01-03 MED ORDER — WARFARIN SODIUM 7.5 MG PO TABS
7.5000 mg | ORAL_TABLET | ORAL | Status: DC
  Administered 2016-01-03: 7.5 mg via ORAL
  Filled 2016-01-03: qty 1

## 2016-01-03 NOTE — Progress Notes (Signed)
[facility]                                                                              Patient Demographics  Michael [name], is a 55 y.o. male, DOB - [birth year], YS:[id number]  Admit date - 12/29/2015   Admitting Physician Thurnell Lose, MD  Outpatient Primary MD for the patient is No PCP Per Patient  LOS - 5   Chief Complaint  Patient presents with  . Shortness of Breath       Brief HPI   Michael [name] is a 55 y.o. 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 In ER workup was consistent with atrial fibrillation with RVR, acute hypoxic respiratory failure due to acute on chronic systolic CHF and possible URI   Assessment & Plan    Principal Problem:   Acute hypoxemic respiratory failure (HCC) due to acute on chronic systolic CHF, dilated cardiomyopathy, URI - Feeling a lot better today, O2 sats 100% on 2 L - Chest x-ray showed no acute findings - 2-D echo in 08/2015 showed EF of 99991111 with diastolic flattening of the ventricular septum. - Continue IV diuresis per cardiology recommendations, negative balance of 4.3 L -Continue beta blocker, no ACEI/ARB due to stage IV CK D   Active Problems: Acute bronchitis- improving  - Continue  Xopenex, added Symbicort, Ceftin - Flutter valve, mucinex    CKD (chronic kidney disease), stage IV (HCC) - Baseline creatinine 2.0 - Continue IV diuresis,  follow creatinine closely, trending up    Essential hypertension - BP stable, continue Lasix, beta blocker  Atrial fibrillation: s/p MAZE procedure in 2011 - Continue digoxin, Lopressor - CHADSvasc 4, Coumadin per pharmacy with heparin bridge  Mitral valve disease - s/p MVR   Diabetes mellitus  - Continue sliding scale insulin   Code Status:Full CODE STATUS   Family Communication: Discussed in detail with the patient, all imaging results, lab results explained to the patient  Disposition Plan: Possible DC in a.m., will follow cardiology recommendations  Time Spent in minutes  25  minutes  Procedures  None  Consults   Cardiology  DVT Prophylaxis warfarin  Medications  Scheduled Meds: . allopurinol  100 mg Oral BID  . antiseptic oral rinse  7 mL Mouth Rinse BID  . bisacodyl  10 mg Rectal Daily  . budesonide-formoterol  2 puff Inhalation BID  . cefUROXime  500 mg Oral BID WC  . digoxin  0.125 mg Oral Daily  . docusate sodium  200 mg Oral BID  . furosemide  80 mg Oral BID  . guaiFENesin  600 mg Oral BID  . hydrALAZINE  25 mg Oral 3 times per day  . insulin aspart  0-5 Units Subcutaneous QHS  . insulin aspart  0-9 Units Subcutaneous TID WC  . insulin glargine  20 Units Subcutaneous QHS  . isosorbide mononitrate  15 mg Oral Daily  . levalbuterol  0.63 mg Nebulization Q6H  . metoprolol tartrate  100 mg Oral BID  . potassium chloride SA  20 mEq Oral Daily  . sodium chloride flush  3 mL Intravenous Q12H  . Vitamin D (Ergocalciferol)  50,000 Units Oral Q7 days  . Warfarin - Pharmacist Dosing Inpatient   Does not apply q1800   Continuous Infusions:  PRN Meds:.acetaminophen, guaiFENesin-dextromethorphan, HYDROcodone-acetaminophen, metoprolol, [DISCONTINUED] ondansetron **OR** ondansetron (ZOFRAN) IV, sodium  chloride   Antibiotics   Anti-infectives    Start     Dose/Rate Route Frequency Ordered Stop   01/02/16 1630  cefUROXime (CEFTIN) tablet 500 mg     500 mg Oral 2 times daily with meals 01/02/16 1452     12/29/15 1845  azithromycin (ZITHROMAX) tablet 500 mg  Status:  Discontinued     500 mg Oral Daily 12/29/15 1837 01/01/16 1030        Subjective:   Michael [name] was seen and examined today.  Overall feeling much better this morning. Less congested. Denies any chest pain. Still has shortness of breath. Patient denies dizziness,  no fevers or chills. Denies abdominal pain, N/V/D/C, new weakness, numbess, tingling. No acute events overnight.    Objective:   Filed Vitals:   01/03/16 0429 01/03/16 1118 01/03/16 1127 01/03/16 1129  BP: 117/84   119/88  Pulse: 99   77  Temp: 98.1 F (36.7 C)   98.1 F (36.7 C)  TempSrc: Oral   Oral  Resp: 20   18  Height:      Weight: 87.272 kg (192 lb 6.4 oz)     SpO2: 98% 98% 97% 100%    Intake/Output Summary (Last 24 hours) at 01/03/16 1305 Last data filed at 01/03/16 1147  Gross per 24 hour  Intake   1500 ml  Output   1250 ml  Net    250 ml     Wt Readings from Last 3 Encounters:  01/03/16 87.272 kg (192 lb 6.4 oz)  12/26/15 94.802 kg (209 lb)  12/07/15 91.627 kg (202 lb)     Exam  General: Alert and oriented x 3, NAD  HEENT:  PERRLA, EOMI,  Neck: Supple, + JVD  CVS: S1 S2 auscultated,  irregularly irregular   Respiratoryclear to auscultation bilaterally  Abdomen: Soft, nontender, nondistended, + bowel sounds  Ext: no cyanosis clubbing, 1+ edema  Neuro: no new deficit   Skin: No rashes  Psych: Normal affect and demeanor, alert and oriented x3    Data Review   Micro Results Recent Results (from the past 240 hour(s))  MRSA PCR Screening     Status: None   Collection Time: 12/29/15  8:24 PM  Result Value Ref Range Status   MRSA by PCR NEGATIVE NEGATIVE Final    Comment:        The GeneXpert MRSA  Assay (FDA approved for NASAL specimens only), is one component of a comprehensive MRSA colonization surveillance program. It is not intended to diagnose MRSA infection nor to guide or monitor treatment for MRSA infections.  Radiology Reports Dg Chest 2 View  12/31/2015  CLINICAL DATA:  Shortness of breath with cough and chest congestion. EXAM: CHEST  2 VIEW COMPARISON:  12/29/2015 FINDINGS: Chronic cardiomegaly. Pulmonary vascularity is normal and the lungs are clear. No effusions. Calcification in the thoracic aorta. Prior median sternotomy. IMPRESSION: No acute abnormalities.  Chronic cardiomegaly. Electronically Signed   By: [name] M.D.   On: 12/31/2015 08:38   Dg Chest 2 View  12/29/2015  CLINICAL DATA:  Three-day history of shortness of breath. Cough and wheezing EXAM: CHEST  2 VIEW COMPARISON:  October 10, 2015 FINDINGS: There is no edema or consolidation. There is mild cardiomegaly with pulmonary vascularity within normal limits. No adenopathy. Patient is status post coronary artery bypass grafting. No bone lesions are apparent. IMPRESSION: Stable cardiac enlargement.  No edema or consolidation. Electronically Signed   By: Lowella Grip III M.D.   On: 12/29/2015 15:47   Dg Abd Portable 1v  01/01/2016  CLINICAL DATA:  Nausea, abdominal discomfort, chronic renal insufficiency. EXAM: PORTABLE ABDOMEN - 1 VIEW COMPARISON:  Renal ultrasound of September 11, 2015 FINDINGS: The bowel gas pattern is within the limits of normal. No free extraluminal gas collections are observed. There is gas in the stomach. There is a coarse calcific density that projects in the left mid abdomen. The bony structures exhibit no acute abnormalities. IMPRESSION: No acute intra-abdominal abnormality is observed. A coarse calcification in the left mid abdomen may lie within the kidney but none was demonstrated on the previous renal ultrasound. If the patient's symptoms warrant further imaging, abdominal  and pelvic CT scanning would be a useful next imaging modality. Electronically Signed   By: David  Martinique M.D.   On: 01/01/2016 09:10    CBC  Recent Labs Lab 12/30/15 0300 12/31/15 0220 01/01/16 0512 01/02/16 0546 01/03/16 0434  WBC 6.6 6.4 6.3 6.5 8.2  HGB 9.6* 10.1* 10.2* 10.6* 10.0*  HCT 31.2* 33.7* 33.8* 34.3* 32.9*  PLT 303 290 260 215 216  MCV 68.3* 69.1* 69.0* 70.0* 69.1*  MCH 21.0* 20.7* 20.8* 21.6* 21.0*  MCHC 30.8 30.0 30.2 30.9 30.4  RDW 18.8* 18.8* 19.0* 19.3* 19.1*    Chemistries   Recent Labs Lab 12/29/15 1436 12/30/15 0300 01/02/16 0546 01/03/16 0434  NA 140 140 134* 137  K 4.9 4.2 3.8 3.9  CL 104 105 89* 91*  CO2 24 27 34* 35*  GLUCOSE 171* 109* 124* 162*  BUN 61* 61* 60* 62*  CREATININE 2.08* 2.28* 2.05* 2.12*  CALCIUM 9.0 8.8* 9.2 9.0   ------------------------------------------------------------------------------------------------------------------ estimated creatinine clearance is 38.7 mL/min (by C-G formula based on Cr of 2.12). ------------------------------------------------------------------------------------------------------------------ No results for input(s): HGBA1C in the last 72 hours. ------------------------------------------------------------------------------------------------------------------ No results for input(s): CHOL, HDL, LDLCALC, TRIG, CHOLHDL, LDLDIRECT in the last 72 hours. ------------------------------------------------------------------------------------------------------------------ No results for input(s): TSH, T4TOTAL, T3FREE, THYROIDAB in the last 72 hours.  Invalid input(s): FREET3 ------------------------------------------------------------------------------------------------------------------ No results for input(s): VITAMINB12, FOLATE, FERRITIN, TIBC, IRON, RETICCTPCT in the last 72 hours.  Coagulation profile  Recent Labs Lab 12/30/15 1028 12/31/15 0220 01/01/16 0512 01/02/16 0546 01/03/16 0434  INR  1.48 1.48 2.12* 2.50* 2.69*    No results for input(s): DDIMER in the last 72 hours.  Cardiac Enzymes No results for input(s): CKMB, TROPONINI, MYOGLOBIN in the last 168 hours.  Invalid input(s): CK ------------------------------------------------------------------------------------------------------------------ Invalid input(s): [place]  01/02/16 0627 01/02/16 1137 01/02/16 1627 01/02/16 2112 01/03/16 0650 01/03/16 1102  GLUCAP 91 118* 166* 145* 144* 203*  Michael [name] M.D. [facility] 01/03/2016, 1:05 PM  Pager: [phone] Between 7am to 7pm - call Pager - [phone]  After 7pm go to [email or web address] - password [email or web address]  Call night coverage person covering after 7pm

## 2016-01-03 NOTE — Progress Notes (Addendum)
Inpatient Diabetes Program Recommendations  AACE/ADA: New Consensus Statement on Inpatient Glycemic Control (2015)  Target Ranges:  Prepandial:   less than 140 mg/dL      Peak postprandial:   less than 180 mg/dL (1-2 hours)      Critically ill patients:  140 - 180 mg/dL   Consult For Education:  Admitting glucose 226, However with minimal coverage of a Novolog Sensitve Correction patient's glucose has remained 166 and below since admission well within inpatient goal. Patient's A1c is 7.3% on 12/29/15 indicating good control at home. Patient has a history of DM and just went on insulin recently. No needs identified at this time. Will follow trends while inpatient.  Thanks,  Tama Headings RN, MSN, [facility] Inpatient Diabetes Coordinator Team Pager [phone] (8a-5p)

## 2016-01-03 NOTE — Care Management Important Message (Signed)
Important Message  Patient Details  Name: Michael [name] MRN: [id number] Date of Birth: Nov 28, 1960   Medicare Important Message Given:  Yes    Rhyleigh Grassel P Boubacar Lerette 01/03/2016, 12:06 PM

## 2016-01-03 NOTE — Progress Notes (Addendum)
ANTICOAGULATION CONSULT NOTE - Follow-up Consult  Pharmacy Consult for coumadin Indication: atrial fibrillation  No Known Allergies  Patient Measurements: Height: 5\' 4"  (162.6 cm) Weight: 192 lb 6.4 oz (87.272 kg) IBW/kg (Calculated) : 59.2 Heparin Dosing Weight: 80.2 kg  Vital Signs: Temp: 98.1 F (36.7 C) (02/16 1129) Temp Source: Oral (02/16 1129) BP: 119/88 mmHg (02/16 1129) Pulse Rate: 77 (02/16 1129)  Labs:  Recent Labs  01/01/16 0512 01/02/16 0546 01/03/16 0434  HGB 10.2* 10.6* 10.0*  HCT 33.8* 34.3* 32.9*  PLT 260 215 216  LABPROT 23.6* 26.7* 28.2*  INR 2.12* 2.50* 2.69*  HEPARINUNFRC 0.73*  --   --   CREATININE  --  2.05* 2.12*     Assessment: 55 yo male admitted with 2 day hx of SOB, orthopnea. On coumadin 5mg  daily except 7.5mg  on TTSun PTA for Afib.  Pt seen in coumadin clinic, last visit PTA on 12/26/15 with scheduled return date 01/04/16 (tomorrow).  INR is therapeutic today at 2.69.  CBC stable, no bleeding reported.   Goal of Therapy:  INR: 2-3 Monitor platelets by anticoagulation protocol: Yes   Plan:  - resume PTA dose of 5 mg daily x 7.5 on T/Th/Sunday - has appt at coumadin clinic for 2/17, will need to reschedule af discharge - daily INR  [name], Pharm.D. BP:[phone] 01/03/2016 2:52 PM

## 2016-01-03 NOTE — Discharge Instructions (Signed)

## 2016-01-03 NOTE — Progress Notes (Addendum)
Hospital Problem List     Principal Problem:   Acute hypoxemic respiratory failure (HCC) Active Problems:   CKD (chronic kidney disease), stage IV (HCC)   Essential hypertension   Mitral valve disease   Anemia - mild exacerbation   Chronic atrial fibrillation (HCC)   Dilated cardiomyopathy (HCC)   Acute respiratory failure ([place])    Patient Profile:   55 y.o.obese male with PMH of mitral valve disease (s/p MVR in 2012 at [facility] in [place]), tricuspid valve repair, CAD, chronic systolic dysfunction (EF [id number] - 35%), permanent atrial fibrillation, CKD, OSA, diabetes, HTN, HLD, and pulmonary hypertension admitted on 12/29/2015 for acute hypoxic respiratory failure.  Subjective   SOB improved. He looks more comfortable.  Inpatient Medications    . allopurinol  100 mg Oral BID  . antiseptic oral rinse  7 mL Mouth Rinse BID  . bisacodyl  10 mg Rectal Daily  . budesonide-formoterol  2 puff Inhalation BID  . cefUROXime  500 mg Oral BID WC  . digoxin  0.125 mg Oral Daily  . docusate sodium  200 mg Oral BID  . furosemide  80 mg Oral BID  . guaiFENesin  600 mg Oral BID  . hydrALAZINE  25 mg Oral 3 times per day  . insulin aspart  0-5 Units Subcutaneous QHS  . insulin aspart  0-9 Units Subcutaneous TID WC  . insulin glargine  20 Units Subcutaneous QHS  . isosorbide mononitrate  15 mg Oral Daily  . levalbuterol  0.63 mg Nebulization Q6H  . metoprolol tartrate  100 mg Oral BID  . potassium chloride SA  20 mEq Oral Daily  . sodium chloride flush  3 mL Intravenous Q12H  . Vitamin D (Ergocalciferol)  50,000 Units Oral Q7 days  . Warfarin - Pharmacist Dosing Inpatient   Does not apply q1800    Vital Signs    Filed Vitals:   01/02/16 2105 01/02/16 2108 01/03/16 0238 01/03/16 0429  BP:  117/87  117/84  Pulse: 87 102 77 99  Temp:  98.2 F (36.8 C)  98.1 F (36.7 C)  TempSrc:  Oral  Oral  Resp: 21 20 22 20   Height:      Weight:    192 lb 6.4 oz (87.272 kg)  SpO2: 98% 98% 98% 98%     Intake/Output Summary (Last 24 hours) at 01/03/16 0942 Last data filed at 01/03/16 0908  Gross per 24 hour  Intake   1320 ml  Output   1250 ml  Net     70 ml   Filed Weights   01/01/16 0525 01/02/16 0436 01/03/16 0429  Weight: 197 lb 12.8 oz (89.721 kg) 197 lb 12 oz (89.7 kg) 192 lb 6.4 oz (87.272 kg)    Physical Exam    General: Obese male in no acute distress. Head: Normocephalic, atraumatic.  Neck: Supple without bruits, JVD elevated to 8cm. Lungs:  Resp regular and unlabored, overall decreased BS but no significant wheezing Heart: Irregularly irregular, S1, S2, no S3, S4, or murmur; no rub. Abdomen: Soft, non-tender, non-distended with normoactive bowel sounds. No hepatomegaly. No rebound/guarding. No obvious abdominal masses. Extremities: No clubbing or cyanosis.  trace edema bilaterally up to mid-shins. Distal pedal pulses are 2+ bilaterally. Neuro: Alert and oriented X 3. Moves all extremities spontaneously. Psych: Normal affect.  Labs    CBC  Recent Labs  01/02/16 0546 01/03/16 0434  WBC 6.5 8.2  HGB 10.6* 10.0*  HCT 34.3* 32.9*  MCV 70.0* 69.1*  PLT 215 123XX123   Basic Metabolic Panel  Recent Labs  01/02/16 0546 01/03/16 0434  NA 134* 137  K 3.8 3.9  CL 89* 91*  CO2 34* 35*  GLUCOSE 124* 162*  BUN 60* 62*  CREATININE 2.05* 2.12*  CALCIUM 9.2 9.0   Hemoglobin A1C No results for input(s): HGBA1C in the last 72 hours.  Telemetry    Atrial fibrillation, rate-controlled in the 80's - 90's.   ECG    No new tracings.   Cardiac Studies and Radiology    Dg Chest 2 View: 12/31/2015  CLINICAL DATA:  Shortness of breath with cough and chest congestion. EXAM: CHEST  2 VIEW COMPARISON:  12/29/2015 FINDINGS: Chronic cardiomegaly. Pulmonary vascularity is normal and the lungs are clear. No effusions. Calcification in the thoracic aorta. Prior median sternotomy. IMPRESSION: No acute abnormalities.  Chronic cardiomegaly. Electronically Signed   By: [name] M.D.   On: 12/31/2015 08:38   Dg Chest 2 View: 12/29/2015  CLINICAL DATA:  Three-day history of shortness of breath. Cough and wheezing EXAM: CHEST  2 VIEW COMPARISON:  October 10, 2015 FINDINGS: There is no edema or consolidation. There is mild cardiomegaly with pulmonary vascularity within normal limits. No adenopathy. Patient is status post coronary artery bypass grafting. No bone lesions are apparent. IMPRESSION: Stable cardiac enlargement.  No edema or consolidation. Electronically Signed   By: Lowella Grip III M.D.   On: 12/29/2015 15:47   Echocardiogram: 09/11/2015 Study Conclusions - Left ventricle: The cavity size was normal. Wall thickness was increased in a pattern of mild LVH. Systolic function was moderately to severely reduced. The estimated ejection fraction was in the range of 30% to 35%. - Ventricular septum: The contour showed diastolic flattening. - Mitral valve: A bioprosthesis was present. Valve area by continuity equation (using LVOT flow): 0.89 cm^2. - Left atrium: The atrium was severely dilated. - Right ventricle: The cavity size was mildly dilated. Systolic function was mildly to moderately reduced. - Right atrium: The atrium was moderately dilated.  Assessment & Plan    1. Acute Hypoxic Respiratory Failure - Symptoms are likely primarily due to URI, especially with his productive cough. CXR without any acute findings. - currently on Azithromycin.  - per admitting team  2. Acute on chronic systolic HF - Echo in 123XX123 showed EF of 99991111 with diastolic flattening of the ventricular septum. - Weight 203 lbs on admission, currently at 201 lbs on 12/31/2015. Discharge weight was 192 in 10/2015 yet his weight is the office has been greater than 200 lbs since. Was 202 lbs in 11/2015, 209 lbs on 12/25/2015.Continue to obtain daily weights. - Net output of -4.5L thus far. Wgt today 192 lbs. Was on Lasix 80mg  PO BID as an outpatient. Scheduled to  receive IV Lasix 80mg  TID today.  - continue BB. No ACE-I/ARB due to Stage 4 CKD.  3. Chronic renal failure- stage IV - baseline creatinine 2.0. - elevated to 2.19 on admission.  - continue to monitor with IV diuresis.  4. Permanent atrial fibrillation - s/p MAZE procedure in 2011 at the time of his MVR - Rates are reasonably controlled with Digoxin 0.125mg  and Lopressor 100 mg BID. BP stable on current Lopressor dose. - This patients CHA2DS2-VASc Score and unadjusted Ischemic Stroke Rate (% per year) is equal to 4.8 % stroke rate/year from a score of 4 (CHF, HTN, Vascular, DM). Resuming Coumadin per pharmacy dosing with Heparin bridge.  5. Mitral valve disease - s/p MVR in  [place].  Plan: Lasix changed to PO BID. Plan per Dr Sallyanne Kuster.   Henri Medal , PA-C 9:42 AM 01/03/2016 Pager: [phone]  I have seen and examined the patient along with [facility] K , PA-C.  I have reviewed the chart, notes and new data.  I agree with PA's note.  PLAN: Seems to be improving. Almost back to chronic O2 requirements. Would plan to leave on current doses of metoprolol and digoxin for rate control, hydralazine and nirtates for cardiomyopathy. Current weight is close to previous estimated "dry weight", I think. Warfarin has been restarted, target INR 2.0-3.0.  Sanda Klein, MD, [place] [phone] 01/03/2016, 12:05 PM

## 2016-01-04 ENCOUNTER — Other Ambulatory Visit: Payer: Medicare Other

## 2016-01-04 LAB — PROTIME-INR
INR: 3.26 — AB (ref 0.00–1.49)
Prothrombin Time: 32.6 seconds — ABNORMAL HIGH (ref 11.6–15.2)

## 2016-01-04 LAB — CBC
HEMATOCRIT: 34.5 % — AB (ref 39.0–52.0)
HEMOGLOBIN: 10.6 g/dL — AB (ref 13.0–17.0)
MCH: 21.5 pg — ABNORMAL LOW (ref 26.0–34.0)
MCHC: 30.7 g/dL (ref 30.0–36.0)
MCV: 70.1 fL — AB (ref 78.0–100.0)
Platelets: 220 10*3/uL (ref 150–400)
RBC: 4.92 MIL/uL (ref 4.22–5.81)
RDW: 19.2 % — ABNORMAL HIGH (ref 11.5–15.5)
WBC: 9.1 10*3/uL (ref 4.0–10.5)

## 2016-01-04 LAB — BASIC METABOLIC PANEL
Anion gap: 9 (ref 5–15)
BUN: 55 mg/dL — ABNORMAL HIGH (ref 6–20)
CO2: 38 mmol/L — ABNORMAL HIGH (ref 22–32)
Calcium: 9.2 mg/dL (ref 8.9–10.3)
Chloride: 90 mmol/L — ABNORMAL LOW (ref 101–111)
Creatinine, Ser: 1.87 mg/dL — ABNORMAL HIGH (ref 0.61–1.24)
GFR calc Af Amer: 45 mL/min — ABNORMAL LOW (ref >60)
GFR calc non Af Amer: 39 mL/min — ABNORMAL LOW (ref >60)
Glucose, Bld: 81 mg/dL (ref 65–99)
Potassium: 3.9 mmol/L (ref 3.5–5.1)
Sodium: 137 mmol/L (ref 135–145)

## 2016-01-04 LAB — GLUCOSE, CAPILLARY
GLUCOSE-CAPILLARY: 82 mg/dL (ref 65–99)
Glucose-Capillary: 192 mg/dL — ABNORMAL HIGH (ref 65–99)

## 2016-01-04 MED ORDER — BUDESONIDE-FORMOTEROL FUMARATE 80-4.5 MCG/ACT IN AERO: 2.0000 | INHALATION_SPRAY | Freq: Two times a day (BID) | RESPIRATORY_TRACT | Status: DC

## 2016-01-04 MED ORDER — WARFARIN SODIUM 5 MG PO TABS: 5.0000 mg | ORAL_TABLET | Freq: Every day | ORAL | Status: DC

## 2016-01-04 MED ORDER — HYDRALAZINE HCL 25 MG PO TABS: 25.0000 mg | ORAL_TABLET | Freq: Three times a day (TID) | ORAL | Status: DC

## 2016-01-04 MED ORDER — GUAIFENESIN ER 600 MG PO TB12: 600.0000 mg | ORAL_TABLET | Freq: Two times a day (BID) | ORAL | Status: DC

## 2016-01-04 MED ORDER — CEFUROXIME AXETIL 500 MG PO TABS: 500.0000 mg | ORAL_TABLET | Freq: Two times a day (BID) | ORAL | Status: DC

## 2016-01-04 MED ORDER — LEVALBUTEROL HCL 0.63 MG/3ML IN NEBU: 0.6300 mg | INHALATION_SOLUTION | Freq: Four times a day (QID) | RESPIRATORY_TRACT | Status: DC

## 2016-01-04 MED ORDER — DOCUSATE SODIUM 100 MG PO CAPS: 100.0000 mg | ORAL_CAPSULE | Freq: Two times a day (BID) | ORAL | Status: DC

## 2016-01-04 MED ORDER — FUROSEMIDE 80 MG PO TABS: 80.0000 mg | ORAL_TABLET | Freq: Two times a day (BID) | ORAL | Status: DC

## 2016-01-04 MED ORDER — GUAIFENESIN-DM 100-10 MG/5ML PO SYRP: 5.0000 mL | ORAL_SOLUTION | ORAL | Status: DC | PRN

## 2016-01-04 NOTE — Progress Notes (Signed)
[facility] [phone]) for today at 3 pm; CM called to reschedule apt for 01/07/2016 at 10 am; information given to the patient and is on his discharge instructions. Patient voiced understanding. Mindi Slicker [facility] [phone]

## 2016-01-04 NOTE — Progress Notes (Addendum)
Pt has orders to be discharged. Discharge instructions given and pt has no additional questions at this time. Medication regimen reviewed and pt educated. Pt verbalized understanding and has no additional questions. Telemetry box removed. Pt stable and waiting for transportation.   1430 Made multiple attempts to call Son Rickie and Daughter Pattie regarding their fathers discharge. Left message with Rickie about his father being discharged. Pt verbalized son Hortencia Pilar is on his way.  [place] Case Manager RN called son Rickie and LM regarding pt being discharged and needing a ride home.  22 Family members arrived to pick up patient.  Maurene Capes RN

## 2016-01-04 NOTE — Progress Notes (Signed)
SATURATION QUALIFICATIONS: (This note is used to comply with regulatory documentation for home oxygen)  Patient Saturations on Room Air at Rest =97 %  Patient Saturations on Room Air while Ambulating = 95%    

## 2016-01-04 NOTE — Discharge Summary (Signed)
Physician Discharge Summary   Patient ID: Michael [name] MRN: [id number] DOB/AGE: [birth year] 55 y.o.  Admit date: 12/29/2015 Discharge date: 01/04/2016  Primary Care Physician:  No PCP Per Patient  Discharge Diagnoses:   . Acute hypoxemic respiratory failure ([place]) . Acute on chronic systolic CHF with dilated cardiomyopathy  . Acute bronchitis improving  . Chronic atrial fibrillation ([place]) . CKD (chronic kidney disease), stage IV ([place]) . Mitral valve disease . Anemia . Dilated cardiomyopathy ([place]) . Essential hypertension   Consults: Cardiology, Dr Sallyanne Kuster  Recommendations for Outpatient Follow-up:  1. Outpatient PCP appointment [facility] 2. Cardiology follow-up scheduled with Dr. Harrington Challenger 3. Please follow PT/INR, BMET   DIET: Heart healthy diet    Allergies:  No Known Allergies   DISCHARGE MEDICATIONS: Current Discharge Medication List    START taking these medications   Details  budesonide-formoterol (SYMBICORT) 80-4.5 MCG/ACT inhaler Inhale 2 puffs into the lungs 2 (two) times daily. Qty: 1 Inhaler, Refills: 12    cefUROXime (CEFTIN) 500 MG tablet Take 1 tablet (500 mg total) by mouth 2 (two) times daily with a meal. X 7days Qty: 14 tablet, Refills: 0    docusate sodium (COLACE) 100 MG capsule Take 1 capsule (100 mg total) by mouth 2 (two) times daily. For constipation Qty: 60 capsule, Refills: 0    guaiFENesin (MUCINEX) 600 MG 12 hr tablet Take 1 tablet (600 mg total) by mouth 2 (two) times daily. Qty: 60 tablet, Refills: 0    guaiFENesin-dextromethorphan (ROBITUSSIN DM) 100-10 MG/5ML syrup Take 5 mLs by mouth every 4 (four) hours as needed for cough. Qty: 118 mL, Refills: 0    levalbuterol (XOPENEX) 0.63 MG/3ML nebulizer solution Take 3 mLs (0.63 mg total) by nebulization every 6 (six) hours. Qty: 3 mL, Refills: 12      CONTINUE these medications which have CHANGED   Details  furosemide (LASIX) 80 MG tablet Take 1 tablet (80  mg total) by mouth 2 (two) times daily. Qty: 60 tablet, Refills: 3    hydrALAZINE (APRESOLINE) 25 MG tablet Take 1 tablet (25 mg total) by mouth 3 (three) times daily. Qty: 90 tablet, Refills: 3    warfarin (COUMADIN) 5 MG tablet Take 1-1.5 tablets (5-7.5 mg total) by mouth at bedtime. Take 1 1/2 tablets (7.5 mg) by mouth on Monday and Thursday, take 1 tablet (5 mg) on Sunday, Tuesday, Wednesday, Friday, Saturday Qty: 60 tablet, Refills: 0      CONTINUE these medications which have NOT CHANGED   Details  allopurinol (ZYLOPRIM) 100 MG tablet Take 1 tablet (100 mg total) by mouth 2 (two) times daily. Qty: 60 tablet, Refills: 2    isosorbide mononitrate (IMDUR) 30 MG 24 hr tablet Take 0.5 tablets (15 mg total) by mouth daily. Qty: 30 tablet, Refills: 11   Associated Diagnoses: Essential hypertension    metoprolol (LOPRESSOR) 100 MG tablet Take 100 mg by mouth 2 (two) times daily.    potassium chloride SA (K-DUR,KLOR-CON) 20 MEQ tablet Take 1 tablet (20 mEq total) by mouth daily. Qty: 30 tablet, Refills: 11   Associated Diagnoses: Essential hypertension    acetaminophen (TYLENOL) 325 MG tablet Take 650 mg by mouth every 6 (six) hours as needed for headache (pain).    Blood Glucose Monitoring Suppl (ACCU-CHEK AVIVA) device Use as Instructed 3 times daily Qty: 1 each, Refills: 0    digoxin (LANOXIN) 0.125 MG tablet Take 1 tablet (0.125 mg total) by mouth daily. Qty: 30 tablet, Refills: 11  glucose blood (ACCU-CHEK AVIVA) test strip Use 3 times daily as directed. Qty: 100 each, Refills: 12    insulin glargine (LANTUS) 100 UNIT/ML injection Inject 0.2 mLs (20 Units total) into the skin at bedtime. Qty: 10 mL, Refills: 2    Lancet Devices (ACCU-CHEK SOFTCLIX) lancets Use as instructed 3 times daily Qty: 1 each, Refills: 5    Vitamin D, Ergocalciferol, (DRISDOL) 50000 UNITS CAPS capsule Take 50,000 Units by mouth every 7 (seven) days.      STOP taking these medications      naproxen (NAPROSYN) 500 MG tablet      metoprolol succinate (TOPROL-XL) 100 MG 24 hr tablet          Brief H and P: For complete details please refer to admission H and P, but in brief Michael [name] is a 55 y.o. 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 In ER workup was consistent with atrial fibrillation with RVR, acute hypoxic respiratory failure due to acute on chronic systolic CHF and possible URI  Hospital Course:   Acute hypoxemic respiratory failure ([place]) due to acute on chronic systolic CHF, dilated cardiomyopathy, URI - Significantly improved - O2 sats 95% on room air. Chest x-ray showed no acute findings - 2-D echo in 08/2015 showed EF of [id number] with diastolic flattening of the ventricular septum. - Cardiology was consulted and patient was placed on aggressive IV diuresis. He is now transitioned to oral Lasix.  - Negative balance of 5.7 L, weight down from 203 lbs to 187 at discharge -Continue beta blocker, no ACEI/ARB due to stage IV CK D   Acute bronchitis- improving  - Continue Xopenex, added Symbicort, Ceftin   CKD (chronic kidney disease), stage IV ([facility]) - Baseline creatinine 2.0 Creatinine 1.8 at the time of discharge, continue oral Lasix    Essential hypertension - BP stable, continue Lasix, beta blocker  Atrial fibrillation: s/p MAZE procedure in 2011 - Continue digoxin, Lopressor - CHADSvasc 4, Coumadin, INR 3.2 at the time of discharge  Mitral valve disease - s/p MVR   Diabetes mellitus  - Continue sliding scale insulin   Day of Discharge BP 104/66 mmHg  Pulse 93  Temp(Src) 98.3 F (36.8 C) (Oral)  Resp 18  Ht 5\' 4"  (1.626 m)  Wt 85.095 kg (187 lb 9.6 oz)  BMI 32.19 kg/m2  SpO2 95%  Physical Exam: General: Alert and awake oriented x3 not in any acute distress. HEENT: anicteric sclera, pupils reactive to light and accommodation CVS: S1-S2 clear no murmur rubs or gallops Chest: clear to auscultation bilaterally, no wheezing rales or rhonchi Abdomen: soft nontender, nondistended, normal bowel sounds Extremities: no cyanosis, clubbing or edema noted bilaterally Neuro: Cranial nerves II-XII intact, no focal neurological deficits   The results of significant diagnostics from this hospitalization (  including imaging, microbiology, ancillary and laboratory) are listed below for reference.    LAB RESULTS: Basic Metabolic Panel:  Recent Labs Lab 01/03/16 0434 01/04/16 0547  NA 137 137  K 3.9 3.9  CL 91* 90*  CO2 35* 38*  GLUCOSE 162* 81  BUN 62* 55*  CREATININE 2.12* 1.87*  CALCIUM 9.0 9.2   Liver Function Tests: No results for input(s): AST, ALT, ALKPHOS, BILITOT, PROT, ALBUMIN in the last 168 hours. No results for input(s): LIPASE, AMYLASE in the last 168 hours. No results for input(s): AMMONIA in the last 168 hours. CBC:  Recent Labs Lab 01/03/16 0434 01/04/16 0547  WBC 8.2 9.1  HGB 10.0* 10.6*  HCT 32.9* 34.5*  MCV 69.1* 70.1*  PLT 216 220   Cardiac Enzymes: No results for input(s): CKTOTAL, CKMB, CKMBINDEX, TROPONINI in the last 168 hours. BNP: Invalid input(s): POCBNP CBG:  Recent Labs Lab 01/04/16 0543 01/04/16 1149  GLUCAP 82 192*    Significant Diagnostic Studies:  Dg  Chest 2 View  12/29/2015  CLINICAL DATA:  Three-day history of shortness of breath. Cough and wheezing EXAM: CHEST  2 VIEW COMPARISON:  October 10, 2015 FINDINGS: There is no edema or consolidation. There is mild cardiomegaly with pulmonary vascularity within normal limits. No adenopathy. Patient is status post coronary artery bypass grafting. No bone lesions are apparent. IMPRESSION: Stable cardiac enlargement.  No edema or consolidation. Electronically Signed   By: Lowella Grip III M.D.   On: 12/29/2015 15:47    2D ECHO:   Disposition and Follow-up:    DISPOSITION:Home   DISCHARGE FOLLOW-UP Follow-up Information    Follow up with [facility] On 01/10/2016.   Why:  Transitional Care Clinic appointment on 01/10/16 at 10:30 am with Dr. Jarold Song.   Contact information:   [address] [place] [phone]      Follow up with [facility] On 01/10/2016.   Why:  Appointment on 01/10/16 at 11:30 am with Nicoletta Ba, Clinical Pharmacist for medication management. Please bring all medications to appointment.   Contact information:   [address] [place] [phone]      Follow up with [name], MD On 01/25/2016.   Specialty:  Cardiology   Why:  2pm   Contact information:   [place] [address] [place] [address] [phone]        Time spent on Discharge: 25 minutes   Signed:   RAI,RIPUDEEP M.D. [facility] 01/04/2016, 1:00 PM Pager: [phone]

## 2016-01-04 NOTE — Progress Notes (Signed)
ANTICOAGULATION CONSULT NOTE - Follow-up Consult  Pharmacy Consult for coumadin Indication: atrial fibrillation  No Known Allergies  Patient Measurements: Height: 5\' 4"  (162.6 cm) Weight: 187 lb 9.6 oz (85.095 kg) IBW/kg (Calculated) : 59.2 Heparin Dosing Weight: 80.2 kg  Vital Signs: Temp: 98.2 F (36.8 C) (02/17 0533) Temp Source: Oral (02/17 0533) BP: 97/62 mmHg (02/17 0533) Pulse Rate: 77 (02/17 0533)  Labs:  Recent Labs  01/02/16 0546 01/03/16 0434 01/04/16 0547  HGB 10.6* 10.0* 10.6*  HCT 34.3* 32.9* 34.5*  PLT 215 216 220  LABPROT 26.7* 28.2* 32.6*  INR 2.50* 2.69* 3.26*  CREATININE 2.05* 2.12* 1.87*     Assessment: 55 yo male admitted with 2 day hx of SOB, orthopnea. On coumadin 5mg  daily except 7.5mg  on TTSun PTA for Afib.  Pt seen in coumadin clinic, last visit PTA on 12/26/15 with scheduled return date 01/04/16 (today).  INR is slightly supra-therapeutic today at 3.26. protime incr 4.4 sec.  CBC stable, no bleeding reported.   Goal of Therapy:  INR: 2-3   Plan:  - continue PTA dose of 5 mg daily x 7.5 on T/Th/Sunday- to get 5 mg today - has appt at coumadin clinic for today 2/17, will need to reschedule af discharge - daily INR  [name], Pharm.D. BP:[id number] 01/04/2016 9:55 AM

## 2016-01-04 NOTE — Evaluation (Signed)
Physical Therapy Evaluation Patient Details Name: Michael [name] MRN: [id number] DOB: 09/10/1961 Today's Date: 01/04/2016   History of Present Illness  55 y.o.obese male with PMH of mitral valve disease (s/p MVR in 2012 at [facility] in [place]), tricuspid valve repair, CAD, chronic systolic dysfunction (EF [id number] - 35%), permanent atrial fibrillation, CKD, OSA, diabetes, HTN, HLD, and pulmonary hypertension admitted on 12/29/2015 for acute hypoxic respiratory failure  Clinical Impression  Patient seen for evaluation, patient mobilizing well with saturations >95% with activity. Patient overall limited by pain from gout flare up. Educated to use RW if patient feels that he is having difficulty getting around. No further acute PT needs. Will sign off.    Follow Up Recommendations No PT follow up;Supervision - Intermittent    Equipment Recommendations  None recommended by PT    Recommendations for Other Services       Precautions / Restrictions Restrictions Weight Bearing Restrictions: No      Mobility  Bed Mobility Overal bed mobility: Independent                Transfers Overall transfer level: Modified independent Equipment used: Rolling walker (2 wheeled)             General transfer comment: No physical assist required to elevate from bed to standing  Ambulation/Gait Ambulation/Gait assistance: Modified independent (Device/Increase time) Ambulation Distance (Feet): 180 Feet Assistive device: Rolling walker (2 wheeled) 10 feet without device Gait Pattern/deviations: Step-through pattern;Antalgic Gait velocity: decreased Gait velocity interpretation: Below normal speed for age/gender General Gait Details: steady but cautious and guarded secondary to gout pain, reliance on RW, no physical assist. 2 standing rest breaks to confirm HR and saturations. >95% with HR mid to upper 80s throughout ambulation  Stairs            Wheelchair Mobility    Modified Rankin  (Stroke Patients Only)       Balance Overall balance assessment: No apparent balance deficits (not formally assessed) (was able to abandon Rw and walk to chair from end of bed )                                           Pertinent Vitals/Pain Pain Assessment: 0-10 Pain Score: 6  Pain Location: LEs (gout)    Home Living Family/patient expects to be discharged to:: Private residence Living Arrangements: Children Available Help at Discharge: Family Type of Home: [place]: Environmental consultant - 2 wheels;Cane - single point      Prior Function Level of Independence: Independent with assistive device(s)         Comments: cane when gout flares up, otherwise does not use device     Hand Dominance   Dominant Hand: Right    Extremity/Trunk Assessment               Lower Extremity Assessment: Overall WFL for tasks assessed         Communication      Cognition Arousal/Alertness: Awake/alert Behavior During Therapy: WFL for tasks assessed/performed Overall Cognitive Status: Within Functional Limits for tasks assessed                      General Comments General comments (skin integrity, edema, etc.): educated on energy conservation. and use of RW during gout flare ups.    Exercises  Assessment/Plan    PT Assessment Patent does not need any further PT services  PT Diagnosis Difficulty walking;Acute pain   PT Problem List    PT Treatment Interventions     PT Goals (Current goals can be found in the Care Plan section) Acute Rehab PT Goals Patient Stated Goal: to feel better PT Goal Formulation: All assessment and education complete, DC therapy    Frequency     Barriers to discharge        Co-evaluation               End of Session Equipment Utilized During Treatment: Gait belt Activity Tolerance: Patient tolerated treatment well;Patient limited by pain Patient left: in chair;with call bell/phone  within reach Nurse Communication: Mobility status         Time: 1254-1310 PT Time Calculation (min) (ACUTE ONLY): 16 min   Charges:   PT Evaluation $PT Eval Low Complexity: 1 Procedure     PT G CodesDuncan Dull 2016/01/27, 1:38 PM Alben Deeds, [facility] DPT  [phone]

## 2016-01-07 ENCOUNTER — Telehealth: Payer: Self-pay

## 2016-01-07 ENCOUNTER — Ambulatory Visit (INDEPENDENT_AMBULATORY_CARE_PROVIDER_SITE_OTHER): Payer: Medicare Other

## 2016-01-07 DIAGNOSIS — I079 Rheumatic tricuspid valve disease, unspecified: Secondary | ICD-10-CM | POA: Diagnosis not present

## 2016-01-07 DIAGNOSIS — Z5181 Encounter for therapeutic drug level monitoring: Secondary | ICD-10-CM | POA: Diagnosis not present

## 2016-01-07 DIAGNOSIS — I059 Rheumatic mitral valve disease, unspecified: Secondary | ICD-10-CM

## 2016-01-07 DIAGNOSIS — I4891 Unspecified atrial fibrillation: Secondary | ICD-10-CM | POA: Diagnosis not present

## 2016-01-07 LAB — POCT INR: INR: 3.6

## 2016-01-07 NOTE — Telephone Encounter (Signed)
Transitional Care Clinic Post-discharge Follow-Up Phone Call:  Date of Discharge:01/04/2016 Principal Discharge Diagnosis(es): acute hypoxemia respiratory failure, acute on chronic systolic CHF with dilated cardiomyopathy, chronic atrial fibrillation, bronchitis Post-discharge Communication: (Clearly document all attempts clearly and date contact made)  Call placed to the patient.  Call Completed: Yes                 With Whom: Patient Interpreter Needed: Yes               Language/Dialect: [name] interpreter with [employer] # [phone]     Please check all that apply:  X Patient is knowledgeable of his/her condition(s) and/or treatment. X  Patient is caring for self at home and reported that he has access to adequate food/nutriton.  ? Patient is receiving assist at home from family and/or caregiver. Family and/or caregiver is knowledgeable of patient's condition(s) and/or treatment. ? Patient is receiving home health services. If so, name of agency.     Medication Reconciliation:  X  Medication list reviewed with patient. - He stated that he has all of his medications and the list from the hospital and does not need to review it. He also noted that he does not have any questions about his medications. He said that he did not pick up the glucometer at [facility] because he did not have money to pay for it.  He confirmed that he went to the coumadin clinic this morning and told them about the nose bleeds that he has been having. He said that he had a list of his coumadin dosing schedule and noted that he is not supposed to take the coumadin today. He then read the schedule for the rest of the week.   Instructed him to bring all of his medications with him to his appointment with Dr Jarold Song on 2/12/21/15 and he stated that he would. Informed him that he has an appointment scheduled with the pharmacist at 11:30am on 01/10/16 after his appointment with Dr Jarold Song and he was appreciative of the  appointments.   ? Patient obtained all discharge medications. If not, why?   Activities of Daily Living:  X  Independent ? Needs assist (describe; ? home DME used) ? Total Care (describe, ? home DME used)   Community resources in place for patient:  X  None  ? Home Health/Home DME ? Assisted Living ? Support Group        Questions/Concerns discussed: He said that he is feeling " okay."  He reported that he will be at his appointment at the [facility] on 01/10/16 and he will drive to his appointment.  No other questions/problems reported.

## 2016-01-09 ENCOUNTER — Other Ambulatory Visit: Payer: Self-pay | Admitting: *Deleted

## 2016-01-09 ENCOUNTER — Other Ambulatory Visit (HOSPITAL_COMMUNITY): Payer: Medicare Other

## 2016-01-09 ENCOUNTER — Telehealth: Payer: Self-pay

## 2016-01-09 MED ORDER — GLUCOSE BLOOD VI STRP: ORAL_STRIP | Status: DC

## 2016-01-09 MED ORDER — ACCU-CHEK SOFTCLIX LANCET DEV MISC: Status: DC

## 2016-01-09 MED ORDER — ACCU-CHEK AVIVA DEVI: Status: DC

## 2016-01-09 NOTE — Telephone Encounter (Signed)
Call placed to the patient's [place] # [phone] to inquire about the coverage for the glucometer and testing supplies.  This CM spoke to [place] in the pharmacy who noted that the cost of the accucheck aviva is $179 , the testing supplies are $38 and the lancets are approximately $14.48. She said that they do not have a medicare part B number for the patient. This CM provided her with the patient's medicare # and she noted that the patient will now have coverage for the meter /supplies.  She said it would be no cost for him.  She noted that they would need a new prescription with a diagnosis code and stated that she would fax the request for information to the [facility] office.   The fax from [facility] was received and shared with Dr Jarold Song. The request for prescriptions for the glucometer, lancets and testing supplies needed a diagnosis, date and physician signature. She requested that Kerri Perches re-submit the prescriptions electronically with the diagnosis and that was completed. .    Call placed to the patient to check on his status and to remind him of his appointments at the [facility] tomorrow, 01/10/16 @ 1030 with Dr Jarold Song and @ 1130 with Nicoletta Ba, [facility]. This CM also wanted to inquire if he has his nebulizer and to inform him that this CM spoke to his [place] the provided them with his medicare # and he now has coverage for the glucometer and testing supplies. [place] interpreter # [phone] with [employer] assisted with the call and a voice mail message was left informing the patient that the CM would call back at a later time.

## 2016-01-10 ENCOUNTER — Encounter: Payer: Self-pay | Admitting: Family Medicine

## 2016-01-10 ENCOUNTER — Encounter: Payer: Self-pay | Admitting: Pharmacist

## 2016-01-10 ENCOUNTER — Ambulatory Visit: Payer: Medicare Other | Admitting: Pharmacist

## 2016-01-10 ENCOUNTER — Ambulatory Visit: Payer: Medicare Other | Attending: Family Medicine | Admitting: Family Medicine

## 2016-01-10 VITALS — BP 127/87 | HR 80 | Temp 97.9°F | Resp 15 | Ht 64.0 in | Wt 199.0 lb

## 2016-01-10 DIAGNOSIS — Z952 Presence of prosthetic heart valve: Secondary | ICD-10-CM | POA: Diagnosis not present

## 2016-01-10 DIAGNOSIS — Z7901 Long term (current) use of anticoagulants: Secondary | ICD-10-CM | POA: Insufficient documentation

## 2016-01-10 DIAGNOSIS — Z79899 Other long term (current) drug therapy: Secondary | ICD-10-CM

## 2016-01-10 DIAGNOSIS — Z5181 Encounter for therapeutic drug level monitoring: Secondary | ICD-10-CM

## 2016-01-10 DIAGNOSIS — I10 Essential (primary) hypertension: Secondary | ICD-10-CM

## 2016-01-10 DIAGNOSIS — E785 Hyperlipidemia, unspecified: Secondary | ICD-10-CM | POA: Diagnosis not present

## 2016-01-10 DIAGNOSIS — I5022 Chronic systolic (congestive) heart failure: Secondary | ICD-10-CM | POA: Insufficient documentation

## 2016-01-10 DIAGNOSIS — I482 Chronic atrial fibrillation, unspecified: Secondary | ICD-10-CM

## 2016-01-10 DIAGNOSIS — Z794 Long term (current) use of insulin: Secondary | ICD-10-CM | POA: Diagnosis not present

## 2016-01-10 DIAGNOSIS — R0602 Shortness of breath: Secondary | ICD-10-CM | POA: Diagnosis not present

## 2016-01-10 DIAGNOSIS — N184 Chronic kidney disease, stage 4 (severe): Secondary | ICD-10-CM | POA: Insufficient documentation

## 2016-01-10 DIAGNOSIS — E118 Type 2 diabetes mellitus with unspecified complications: Secondary | ICD-10-CM | POA: Diagnosis not present

## 2016-01-10 DIAGNOSIS — M25579 Pain in unspecified ankle and joints of unspecified foot: Secondary | ICD-10-CM | POA: Insufficient documentation

## 2016-01-10 DIAGNOSIS — M109 Gout, unspecified: Secondary | ICD-10-CM | POA: Insufficient documentation

## 2016-01-10 DIAGNOSIS — M25571 Pain in right ankle and joints of right foot: Secondary | ICD-10-CM | POA: Insufficient documentation

## 2016-01-10 DIAGNOSIS — I129 Hypertensive chronic kidney disease with stage 1 through stage 4 chronic kidney disease, or unspecified chronic kidney disease: Secondary | ICD-10-CM | POA: Diagnosis not present

## 2016-01-10 LAB — GLUCOSE, POCT (MANUAL RESULT ENTRY): POC Glucose: 7.3 mg/dl — AB (ref 70–99)

## 2016-01-10 MED ORDER — ALLOPURINOL 100 MG PO TABS: 100.0000 mg | ORAL_TABLET | Freq: Two times a day (BID) | ORAL | Status: DC

## 2016-01-10 MED ORDER — TRAMADOL HCL 50 MG PO TABS: 50.0000 mg | ORAL_TABLET | Freq: Three times a day (TID) | ORAL | Status: DC | PRN

## 2016-01-10 MED ORDER — INSULIN GLARGINE 100 UNIT/ML ~~LOC~~ SOLN: 20.0000 [IU] | Freq: Every day | SUBCUTANEOUS | Status: DC

## 2016-01-10 NOTE — Progress Notes (Signed)
Patient here for follow up although he is not sure of what He brought his meds today

## 2016-01-10 NOTE — Progress Notes (Signed)
S:    Patient arrives in good spirits.  Presents for medication management.  Patient reports adherence with medications. Patient denies having any difficulty remembering to take his medications. He keeps them in a place that helps him remember to take them when he needs them. He denies any adverse effects to his medications. He denies any questions or concerns about his medications or disease states. He feels comfortable managing his diabetes and does not have any concerns about what he is eating.  Medication Adherence Questionnaire (A score of 2 or more points indicates risk for nonadherence)  Do you know what each of your medicines is for?  No (1)  Do you ever have trouble remembering to take your medicine?  No  Do you ever not take a medicine because you feel you do not need it?   No  Do you think that any of your medicines is not helping you?  No  Do you have any physical problems such as vision loss that keep you from taking your medicines as prescribed?   No  Do you think any of your medicine is causing a side effect?  No  Do you know the names of ALL of your medicines?  No (1)  Do you think that you need ALL of your medicines?  Yes  In the past 6 months, have you missed getting a refill or a new prescription filled on time?  Yes  How often do you miss taking a dose of medicine?  Never   TOTAL SCORE  2    O:    A/P: 1. Medication Adherence: Patient has no known adherence challenges though patient may not be straightforward in his adherence. Barriers include: lack of knowledge and difficulty navigating the healthcare system due to not speaking English. All of his medications are with him and in date so it appears that he is taking them all as prescribed. Patient is so adamant that he does not have any adherence issues that it is hard to assess if there truly is any issues.  2. Medication Reconciliation: medication list reviewed and updated. The following issues were noted: patient is  missing a few of his medications (digoxin and inhaler) but he has not picked them up yet. He will pick them up today. Patient was provided with a printed medication list.    Written patient instructions provided.  Total time in face to face counseling 20 minutes.  Follow up in Pharmacist Clinic Visit as needed.

## 2016-01-11 NOTE — Progress Notes (Signed)
TRANSITIONAL CARE CLINIC   Subjective:  Patient ID: Michael [name], male    DOB: 05-25-1961  Age: 55 y.o. MRN: [id number]  CC: Follow-up   HPI Michael [name] is a 55 y.o. male with a history of hypertension, hyperlipidemia, mitral valve replacement with porcine valve, tricuspid valve repair, atrial fibrillation (on anticoagulation with Coumadin),Chronic systolic heartfailure  (EF 30-35% from 08/2015), CKD hospitalized for acute hypoxic respiratory failure thought to be secondary to a URI versus acute bronchitis  He had presented to the ED with shortness of breath, orthopnea and was saturating at 95% on room air on presentation. Chest x-ray revealed no acute finding; he was placed on Xopenex and Ceftin for acute bronchitis. He also received aggressive diuretics and was closely followed by cardiology with a decrease in weight from 203 pounds to 187 pounds. Symbicort was also added to his medication regimen and he did have some improvement in symptoms after which he was subsequently discharged.  Interval history He reports doing well and has no chest pains or shortness of breath. Review of his medications indicates he does not have any of his inhalers with him and states they are at home. Digoxin is also missing from the list of his medication and he has old doses of metoprolol and hydralazine (75 mg and 10 mg). He complains of intermittent right ankle pain which is worse with ambulation that this is different from his gout flare.  Outpatient Prescriptions Prior to Visit  Medication Sig Dispense Refill  . Blood Glucose Monitoring Suppl (ACCU-CHEK AVIVA) device Use as Instructed 3 times daily 1 each 0  . cefUROXime (CEFTIN) 500 MG tablet Take 1 tablet (500 mg total) by mouth 2 (two) times daily with a meal. X 7days 14 tablet 0  . furosemide (LASIX) 80 MG tablet Take 1 tablet (80 mg total) by mouth 2 (two) times daily. 60 tablet 3  . glucose blood (ACCU-CHEK AVIVA) test strip Use 3 times  daily as directed. 100 each 12  . hydrALAZINE (APRESOLINE) 25 MG tablet Take 1 tablet (25 mg total) by mouth 3 (three) times daily. 90 tablet 3  . isosorbide mononitrate (IMDUR) 30 MG 24 hr tablet Take 0.5 tablets (15 mg total) by mouth daily. (Patient taking differently: Take 15 mg by mouth daily with supper. ) 30 tablet 11  . Lancet Devices (ACCU-CHEK SOFTCLIX) lancets Use as instructed 3 times daily 1 each 5  . potassium chloride SA (K-DUR,KLOR-CON) 20 MEQ tablet Take 1 tablet (20 mEq total) by mouth daily. 30 tablet 11  . warfarin (COUMADIN) 5 MG tablet Take 1-1.5 tablets (5-7.5 mg total) by mouth at bedtime. Take 1 1/2 tablets (7.5 mg) by mouth on Monday and Thursday, take 1 tablet (5 mg) on Sunday, Tuesday, Wednesday, Friday, Saturday 60 tablet 0  . allopurinol (ZYLOPRIM) 100 MG tablet Take 1 tablet (100 mg total) by mouth 2 (two) times daily. 60 tablet 2  . acetaminophen (TYLENOL) 325 MG tablet Take 650 mg by mouth every 6 (six) hours as needed for headache (pain). Reported on 01/10/2016    . budesonide-formoterol (SYMBICORT) 80-4.5 MCG/ACT inhaler Inhale 2 puffs into the lungs 2 (two) times daily. (Patient not taking: Reported on 01/10/2016) 1 Inhaler 12  . digoxin (LANOXIN) 0.125 MG tablet Take 1 tablet (0.125 mg total) by mouth daily. (Patient not taking: Reported on 01/10/2016) 30 tablet 11  . docusate sodium (COLACE) 100 MG capsule Take 1 capsule (100 mg total) by mouth 2 (two) times daily. For constipation (Patient not taking:  Reported on 01/10/2016) 60 capsule 0  . levalbuterol (XOPENEX) 0.63 MG/3ML nebulizer solution Take 3 mLs (0.63 mg total) by nebulization every 6 (six) hours. (Patient not taking: Reported on 01/10/2016) 3 mL 12  . metoprolol (LOPRESSOR) 100 MG tablet Take 100 mg by mouth 2 (two) times daily.    . Vitamin D, Ergocalciferol, (DRISDOL) 50000 UNITS CAPS capsule Take 50,000 Units by mouth every 7 (seven) days. Reported on 01/10/2016    . guaiFENesin (MUCINEX) 600 MG 12 hr  tablet Take 1 tablet (600 mg total) by mouth 2 (two) times daily. (Patient not taking: Reported on 01/10/2016) 60 tablet 0  . guaiFENesin-dextromethorphan (ROBITUSSIN DM) 100-10 MG/5ML syrup Take 5 mLs by mouth every 4 (four) hours as needed for cough. (Patient not taking: Reported on 01/10/2016) 118 mL 0  . insulin glargine (LANTUS) 100 UNIT/ML injection Inject 0.2 mLs (20 Units total) into the skin at bedtime. (Patient not taking: Reported on 01/10/2016) 10 mL 2   No facility-administered medications prior to visit.    ROS Review of Systems  Constitutional: Negative for activity change and appetite change.  HENT: Negative for sinus pressure and sore throat.   Eyes: Negative for visual disturbance.  Respiratory: Negative for cough, chest tightness and shortness of breath.   Cardiovascular: Negative for chest pain and leg swelling.  Gastrointestinal: Negative for abdominal pain, diarrhea, constipation and abdominal distention.  Endocrine: Negative.   Genitourinary: Negative for dysuria.  Musculoskeletal: Positive for joint swelling (right ankle pain and ambulation and associated swelling). Negative for myalgias.  Skin: Negative for rash.  Allergic/Immunologic: Negative.   Neurological: Negative for weakness, light-headedness and numbness.  Psychiatric/Behavioral: Negative for suicidal ideas and dysphoric mood.    Objective:  BP 127/87 mmHg  Pulse 80  Temp(Src) 97.9 F (36.6 C)  Resp 15  Ht 5\' 4"  (1.626 m)  Wt 199 lb (90.266 kg)  BMI 34.14 kg/m2  SpO2 100%  BP/Weight 01/10/2016 [id number]  Systolic BP [id number]  Diastolic BP 87 66 82  Wt. (Lbs) 199 187.6 209  BMI 34.14 32.19 35.86    Wt Readings from Last 3 Encounters:  01/10/16 199 lb (90.266 kg)  01/04/16 187 lb 9.6 oz (85.095 kg)  12/26/15 209 lb (94.802 kg)     Physical Exam  Constitutional: He is oriented to person, place, and time. He appears well-developed and well-nourished.  Neck: No JVD present.    Cardiovascular: Normal rate, normal heart sounds and intact distal pulses.  An irregularly irregular rhythm present.  No murmur heard. Pulmonary/Chest: Effort normal and breath sounds normal. He has no wheezes. He has no rales. He exhibits no tenderness.  Abdominal: Soft. Bowel sounds are normal. He exhibits no distension and no mass. There is no tenderness.  Musculoskeletal: Normal range of motion. He exhibits edema (Mild right ankle edema on the lateral malleolus) and tenderness (mild tenderness on range of motion of right ankle joint; mild erythema dorsum of right foot.).  Neurological: He is alert and oriented to person, place, and time.  Skin: Skin is warm and dry.     Assessment & Plan:   1. Type 2 diabetes mellitus with complication, with long-term current use of insulin (HCC) A1c 7.3 - Glucose (CBG) - insulin glargine (LANTUS) 100 UNIT/ML injection; Inject 0.2 mLs (20 Units total) into the skin at bedtime.  Dispense: 10 mL; Refill: 2  2. Anticoagulated on Coumadin Last INR was 3.6 on 01/15/16 (INR goal 2.5-3.5) Currently followed by the Coumadin clinic  3.  Essential hypertension He has metoprolol 100 mg twice a day and metoprolol 75 mg twice a day. I have discontinued to 75 mg  4. Chronic systolic CHF (congestive heart failure) (HCC) EF 30-35% No evidence of acute failure. He has gained 12 pounds in the last 6 days Advised on restricting fluid to less than 2 L per day. Low-sodium, DASH diet, daily weight checks He has both 10 mg and 25 mg of hydralazine; I have discontinued his 10 mg and advised him to stick with the 25 mg as per med list He does not have his digoxin bottle with him which I have advised him to pick up from his pharmacy as he does have multiple refilled  5. Chronic atrial fibrillation (HCC) [place] 2 VASC score of 4 Anticoagulation with Coumadin, rate controlled with metoprolol  6. 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 Avoid nephrotoxins.  7. Gout without tophus, unspecified cause, unspecified chronicity, unspecified site No acute flare - allopurinol (ZYLOPRIM) 100 MG tablet; Take 1 tablet (100 mg total) by mouth 2 (two) times daily.  Dispense: 60 tablet; Refill: 2  8. Pain in joint, ankle and foot, right Cannot exclude underlying osteoarthritis Holding off on NSAIDs due to nephropathy - traMADol (ULTRAM) 50 MG tablet; Take 1 tablet (50 mg total) by mouth every 8 (eight) hours as needed.  Dispense: 30 tablet; Refill: 0  I have personally reviewed his medications and reconciled them.  Meds ordered this encounter  Medications  . insulin glargine (LANTUS) 100 UNIT/ML injection    Sig: Inject 0.2 mLs (20 Units total) into the skin at bedtime.    Dispense:  10 mL    Refill:  2  . allopurinol (ZYLOPRIM) 100 MG tablet    Sig: Take 1 tablet (100 mg total) by mouth 2 (two) times daily.    Dispense:  60 tablet    Refill:  2  . traMADol (ULTRAM) 50 MG tablet    Sig: Take 1 tablet (50 mg total) by mouth every 8 (eight) hours as needed.    Dispense:  30 tablet    Refill:  0    Follow-up: Return in about 1 week (around 01/17/2016) for TCC- follow up on ankle pain.   [name] MD

## 2016-01-16 ENCOUNTER — Telehealth: Payer: Self-pay

## 2016-01-16 NOTE — Telephone Encounter (Signed)
This Case Manager placed call to patient to check status and to determine if patient picked up glucometer and diabetes monitoring supplies. Call made with assistance of [facility] [employer] telephonic interpreting (Interpreter # [phone]); unable to reach patient. Voicemail left informing that Case Manager would call again at a later time.

## 2016-01-21 ENCOUNTER — Ambulatory Visit (INDEPENDENT_AMBULATORY_CARE_PROVIDER_SITE_OTHER): Payer: Medicare Other | Admitting: *Deleted

## 2016-01-21 DIAGNOSIS — I4891 Unspecified atrial fibrillation: Secondary | ICD-10-CM | POA: Diagnosis not present

## 2016-01-21 DIAGNOSIS — I079 Rheumatic tricuspid valve disease, unspecified: Secondary | ICD-10-CM | POA: Diagnosis not present

## 2016-01-21 DIAGNOSIS — Z5181 Encounter for therapeutic drug level monitoring: Secondary | ICD-10-CM | POA: Diagnosis not present

## 2016-01-21 DIAGNOSIS — I059 Rheumatic mitral valve disease, unspecified: Secondary | ICD-10-CM | POA: Diagnosis not present

## 2016-01-21 LAB — POCT INR: INR: 1.2

## 2016-01-22 ENCOUNTER — Telehealth: Payer: Self-pay

## 2016-01-22 NOTE — Telephone Encounter (Signed)
Call placed to the patient to check on his status and to remind him of his appointment at the [facility] ( [facility]) at [facility] on 01/24/16 @ 0945.  [place] interpreter # [phone] with [facility] [employer] assisted with the call. The patient said that he was aware of his appointment and has transportation to the clinic.  He reported that he has all of his medications and has been taking them as ordered. He said that he has not picked up his glucometer and testing supplies at his pharmacy yet. Informed him that the CM has spoken to the pharmacist and his insurance will cover the glucometer and supplies. Stressed with him the importance of getting the glucometer so that he is able to track his blood sugars. He then stated " okay."  He also reported that he went to the coumadin clinic yesterday and he had his instructions for taking coumadin this week. He was able to correctly state how much coumadin he is supposed to take according to the report from his clinic visit yesterday. He said that he goes back to have his blood work done next Monday.  No other questions/concerns reported.

## 2016-01-24 ENCOUNTER — Encounter: Payer: Self-pay | Admitting: Family Medicine

## 2016-01-24 ENCOUNTER — Ambulatory Visit: Payer: Medicare Other | Attending: Family Medicine | Admitting: Family Medicine

## 2016-01-24 VITALS — BP 133/95 | HR 119 | Temp 97.6°F | Resp 15 | Ht 64.0 in | Wt 203.2 lb

## 2016-01-24 DIAGNOSIS — M109 Gout, unspecified: Secondary | ICD-10-CM

## 2016-01-24 DIAGNOSIS — Z952 Presence of prosthetic heart valve: Secondary | ICD-10-CM | POA: Diagnosis not present

## 2016-01-24 DIAGNOSIS — D631 Anemia in chronic kidney disease: Secondary | ICD-10-CM | POA: Diagnosis not present

## 2016-01-24 DIAGNOSIS — Z794 Long term (current) use of insulin: Secondary | ICD-10-CM

## 2016-01-24 DIAGNOSIS — M79671 Pain in right foot: Secondary | ICD-10-CM | POA: Insufficient documentation

## 2016-01-24 DIAGNOSIS — I129 Hypertensive chronic kidney disease with stage 1 through stage 4 chronic kidney disease, or unspecified chronic kidney disease: Secondary | ICD-10-CM | POA: Diagnosis not present

## 2016-01-24 DIAGNOSIS — E785 Hyperlipidemia, unspecified: Secondary | ICD-10-CM | POA: Diagnosis not present

## 2016-01-24 DIAGNOSIS — E1122 Type 2 diabetes mellitus with diabetic chronic kidney disease: Secondary | ICD-10-CM | POA: Diagnosis not present

## 2016-01-24 DIAGNOSIS — I2510 Atherosclerotic heart disease of native coronary artery without angina pectoris: Secondary | ICD-10-CM | POA: Diagnosis not present

## 2016-01-24 DIAGNOSIS — Z7901 Long term (current) use of anticoagulants: Secondary | ICD-10-CM | POA: Insufficient documentation

## 2016-01-24 DIAGNOSIS — I482 Chronic atrial fibrillation, unspecified: Secondary | ICD-10-CM

## 2016-01-24 DIAGNOSIS — I272 Other secondary pulmonary hypertension: Secondary | ICD-10-CM | POA: Insufficient documentation

## 2016-01-24 DIAGNOSIS — I480 Paroxysmal atrial fibrillation: Secondary | ICD-10-CM | POA: Diagnosis not present

## 2016-01-24 DIAGNOSIS — E119 Type 2 diabetes mellitus without complications: Secondary | ICD-10-CM | POA: Diagnosis not present

## 2016-01-24 DIAGNOSIS — I10 Essential (primary) hypertension: Secondary | ICD-10-CM | POA: Diagnosis not present

## 2016-01-24 DIAGNOSIS — I5022 Chronic systolic (congestive) heart failure: Secondary | ICD-10-CM | POA: Diagnosis not present

## 2016-01-24 DIAGNOSIS — K219 Gastro-esophageal reflux disease without esophagitis: Secondary | ICD-10-CM | POA: Diagnosis not present

## 2016-01-24 DIAGNOSIS — J45909 Unspecified asthma, uncomplicated: Secondary | ICD-10-CM | POA: Insufficient documentation

## 2016-01-24 DIAGNOSIS — Z79899 Other long term (current) drug therapy: Secondary | ICD-10-CM | POA: Diagnosis not present

## 2016-01-24 DIAGNOSIS — N184 Chronic kidney disease, stage 4 (severe): Secondary | ICD-10-CM | POA: Diagnosis not present

## 2016-01-24 DIAGNOSIS — G4733 Obstructive sleep apnea (adult) (pediatric): Secondary | ICD-10-CM | POA: Diagnosis not present

## 2016-01-24 LAB — GLUCOSE, POCT (MANUAL RESULT ENTRY): POC GLUCOSE: 133 mg/dL — AB (ref 70–99)

## 2016-01-24 MED ORDER — COLCHICINE 0.6 MG PO TABS: ORAL_TABLET | ORAL | Status: DC

## 2016-01-24 MED ORDER — ACCU-CHEK AVIVA DEVI: Status: DC

## 2016-01-24 MED ORDER — GLUCOSE BLOOD VI STRP: ORAL_STRIP | Status: DC

## 2016-01-24 MED ORDER — ACCU-CHEK SOFTCLIX LANCET DEV MISC: Status: DC

## 2016-01-24 MED FILL — COLCHICINE 0.6 MG TABLET: 0.6 | 15 days supply | Qty: 30 | Fill #0

## 2016-01-24 NOTE — Progress Notes (Signed)
Transitional care clinic  Subjective:    Patient ID: Michael [name], male    DOB: [birth year], 55 y.o.   MRN: [id number]  HPI He is a 55 y.o. male seen with the aid of the language line with a history of Type 2DM (A1c 7.3), hypertension, hyperlipidemia, mitral valve replacement with porcine valve, tricuspid valve repair, atrial fibrillation (on anticoagulation with Coumadin),Chronic systolic heartfailure  (EF 30-35% from 08/2015), CKD with recent hospitalization for acute hypoxic respiratory failure thought to be secondary to a URI versus acute bronchitis.  At his last office visit his medications were reviewed extensively and he also had a visit with the clinical pharmacist; Digoxin was missing and he had multiple doses of metoprolol and hydralazine which we had reconciled and he is compliant today however he is yet to pick up digoxin from the pharmacy. He also does not have his glucometer despite the fact that case managers have called the pharmacy over and over again to verify the prescription for the glucometer was received. He still complains of right foot pain for which I have placed him on tramadol and he reports pain is still a 5/10. He endorses eating seafood recently; there is associated swelling of the foot and pain is worse when he ambulates. He does have some shortness of breath with mild exertion, two-pillow orthopnea, no chest pains.  Past Medical History  Diagnosis Date  . Coronary artery disease   . Diabetes mellitus without complication ([place])   . Hypertension   . Chronic systolic CHF (congestive heart failure) (HCC)     a. prior EF normal; b. Echo 10/16: Mild LVH, EF 30-35%, mitral valve bioprosthesis present without evidence of stenosis or regurgitation, severe LAE, mild RVE with mild to moderately reduced RVSF, moderate RAE  . 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 H/O tricuspid valve repair 2012  . Paroxysmal atrial fibrillation (HCC)     s/p Maze  procedure at time of MVR in 2011  . Diabetes mellitus type 2 in obese ([place])   . Obstructive sleep apnea   . Chronic kidney disease (CKD), stage IV (severe) ([place])   . Anemia of chronic disease   . Pulmonary HTN ([place])   . History of echocardiogram     Echo at [facility] in [facility] 4/12: mod LVH, EF 60-65%, mod LAE, tissue MVR ok, mod TR, mod pulmo HTN with RVSP 48 mmHg  . Asthma   . GERD (gastroesophageal reflux disease)     Past Surgical History  Procedure Laterality Date  . Cardiac surgery      Social History   Social History  . Marital Status: Single    Spouse Name: N/A  . Number of Children: N/A  . Years of Education: N/A   Occupational History  . Not on file.   Social History Main Topics  . Smoking status: Current Some Day Smoker  . Smokeless tobacco: Not on file  . Alcohol Use: No  . Drug Use: No  . Sexual Activity: Not on file   Other Topics Concern  . Not on file   Social History Narrative    No Known Allergies  Current Outpatient Prescriptions on File Prior to Visit  Medication Sig Dispense Refill  . acetaminophen (TYLENOL) 325 MG tablet Take 650 mg by mouth every 6 (six) hours as needed for headache (pain). Reported on 01/10/2016    . allopurinol (ZYLOPRIM) 100 MG tablet Take 1 tablet (100 mg total) by mouth 2 (two) times daily. 60 tablet  2  . budesonide-formoterol (SYMBICORT) 80-4.5 MCG/ACT inhaler Inhale 2 puffs into the lungs 2 (two) times daily. 1 Inhaler 12  . digoxin (LANOXIN) 0.125 MG tablet Take 1 tablet (0.125 mg total) by mouth daily. 30 tablet 11  . docusate sodium (COLACE) 100 MG capsule Take 1 capsule (100 mg total) by mouth 2 (two) times daily. For constipation 60 capsule 0  . furosemide (LASIX) 80 MG tablet Take 1 tablet (80 mg total) by mouth 2 (two) times daily. 60 tablet 3  . hydrALAZINE (APRESOLINE) 25 MG tablet Take 1 tablet (25 mg total) by mouth 3 (three) times daily. 90 tablet 3  . insulin glargine (LANTUS) 100 UNIT/ML injection Inject 0.2  mLs (20 Units total) into the skin at bedtime. 10 mL 2  . isosorbide mononitrate (IMDUR) 30 MG 24 hr tablet Take 0.5 tablets (15 mg total) by mouth daily. (Patient taking differently: Take 15 mg by mouth daily with supper. ) 30 tablet 11  . levalbuterol (XOPENEX) 0.63 MG/3ML nebulizer solution Take 3 mLs (0.63 mg total) by nebulization every 6 (six) hours. 3 mL 12  . metoprolol (LOPRESSOR) 100 MG tablet Take 100 mg by mouth 2 (two) times daily.    . potassium chloride SA (K-DUR,KLOR-CON) 20 MEQ tablet Take 1 tablet (20 mEq total) by mouth daily. 30 tablet 11  . traMADol (ULTRAM) 50 MG tablet Take 1 tablet (50 mg total) by mouth every 8 (eight) hours as needed. 30 tablet 0  . Vitamin D, Ergocalciferol, (DRISDOL) 50000 UNITS CAPS capsule Take 50,000 Units by mouth every 7 (seven) days. Reported on 01/10/2016    . warfarin (COUMADIN) 5 MG tablet Take 1-1.5 tablets (5-7.5 mg total) by mouth at bedtime. Take 1 1/2 tablets (7.5 mg) by mouth on Monday and Thursday, take 1 tablet (5 mg) on Sunday, Tuesday, Wednesday, Friday, Saturday 60 tablet 0   No current facility-administered medications on file prior to visit.      Review of Systems Constitutional: Negative for activity change and appetite change.  HENT: Negative for sinus pressure and sore throat.   Eyes: Negative for visual disturbance.  Respiratory: Negative for cough, chest tightness and positive for shortness of breath.   Cardiovascular: Negative for chest pain and positive for leg swelling.  Gastrointestinal: Negative for abdominal pain, diarrhea, constipation and abdominal distention.  Endocrine: Negative.   Genitourinary: Negative for dysuria.  Musculoskeletal: Positive for joint swelling (right ankle pain and ambulation and associated swelling). Negative for myalgias.  Skin: Negative for rash.  Allergic/Immunologic: Negative.   Neurological: Negative for weakness, light-headedness and numbness.  Psychiatric/Behavioral: Negative for  suicidal ideas and dysphoric mood.    Objective: Filed Vitals:   01/24/16 0954  BP: 133/95  Pulse: 119  Temp: 97.6 F (36.4 C)  Resp: 15  Height: 5\' 4"  (1.626 m)  Weight: 203 lb 3.2 oz (92.171 kg)  SpO2: 98%      Physical Exam Constitutional: He is oriented to person, place, and time. He appears well-developed and well-nourished.  Neck: mild JVD present.  Cardiovascular: Tachycardic rate, An irregularly irregular rhythm present. Normal pulses No murmur heard. Pulmonary/Chest: Effort normal and breath sounds normal. He has no wheezes. He has no rales. He exhibits no tenderness.  Abdominal: Soft. Bowel sounds are normal. He exhibits no distension and no mass. There is no tenderness.  Musculoskeletal: Normal range of motion. He exhibits edema (Mild right ankle edema on the lateral malleolus) and tenderness (mild tenderness on range of motion of right ankle joint; mild  erythema dorsum of right foot.).  Neurological: He is alert and oriented to person, place, and time.  Skin: Skin is warm and dry.     Wt Readings from Last 3 Encounters:  01/24/16 203 lb 3.2 oz (92.171 kg)  01/10/16 199 lb (90.266 kg)  01/04/16 187 lb 9.6 oz (85.095 kg)       Assessment & Plan:  1. Type 2 diabetes mellitus with complication, with long-term current use of insulin (HCC) A1c 7.3, CBG 133 - Glucose (CBG) - insulin glargine (LANTUS) 100 UNIT/ML injection; Inject 0.2 mLs (20 Units total) into the skin at bedtime.  Dispense: 10 mL; Refill: 2  2. Anticoagulated on Coumadin Last INR was 1.2 on 01/21/16 (INR goal 2.5-3.5) Currently followed by the Coumadin clinic  3. Essential hypertension Controlled Continue metoprolol 100 mg twice a day  4. Chronic systolic CHF (congestive heart failure) (HCC) EF 30-35% NYHA class III He has gained 20 pounds in the last 3 weeks Advised to take an extra dose of 80 mg of Lasix and will see cardiology tomorrow. Advised on restricting fluid to less than 2 L per  day. Low-sodium, DASH diet, daily weight checks He does not have his digoxin bottle with him which I have advised him to pick up from his pharmacy as he does have multiple refilled  5. Chronic atrial fibrillation (HCC) [place] 2 VASC score of 4 Anticoagulation with Coumadin, rate controlled with metoprolol  6. CKD (chronic kidney disease), stage IV ([place]) Diabetic versus hypertensive nephropathy He no showed his nephrology appointment and has been provided the number to [facility] to reschedule.  7. Gout without tophus, unspecified cause, unspecified chronicity, unspecified site versus right ankle pain Suspicious for current flare. We'll add colchicine to regimen-will exercise caution due to CKD Continue allopurinol Advised to avoid foods that trigger gout. Continue tramadol.   This note has been created with Surveyor, quantity. Any transcriptional errors are unintentional.

## 2016-01-24 NOTE — Progress Notes (Signed)
Ankle pain 5/10 today Still has not picked up his glucometer Did not take his meds this am

## 2016-01-25 ENCOUNTER — Encounter: Payer: Self-pay | Admitting: Internal Medicine

## 2016-01-25 ENCOUNTER — Ambulatory Visit (INDEPENDENT_AMBULATORY_CARE_PROVIDER_SITE_OTHER): Payer: Medicare Other | Admitting: Internal Medicine

## 2016-01-25 ENCOUNTER — Ambulatory Visit (INDEPENDENT_AMBULATORY_CARE_PROVIDER_SITE_OTHER): Payer: Medicare Other | Admitting: *Deleted

## 2016-01-25 VITALS — BP 124/88 | HR 76 | Ht 64.0 in | Wt 200.1 lb

## 2016-01-25 DIAGNOSIS — M109 Gout, unspecified: Secondary | ICD-10-CM

## 2016-01-25 DIAGNOSIS — I5022 Chronic systolic (congestive) heart failure: Secondary | ICD-10-CM | POA: Diagnosis not present

## 2016-01-25 DIAGNOSIS — I059 Rheumatic mitral valve disease, unspecified: Secondary | ICD-10-CM

## 2016-01-25 DIAGNOSIS — I4891 Unspecified atrial fibrillation: Secondary | ICD-10-CM | POA: Diagnosis not present

## 2016-01-25 DIAGNOSIS — I079 Rheumatic tricuspid valve disease, unspecified: Secondary | ICD-10-CM | POA: Diagnosis not present

## 2016-01-25 DIAGNOSIS — I420 Dilated cardiomyopathy: Secondary | ICD-10-CM | POA: Diagnosis not present

## 2016-01-25 DIAGNOSIS — I509 Heart failure, unspecified: Secondary | ICD-10-CM | POA: Diagnosis not present

## 2016-01-25 DIAGNOSIS — Z5181 Encounter for therapeutic drug level monitoring: Secondary | ICD-10-CM | POA: Diagnosis not present

## 2016-01-25 DIAGNOSIS — I428 Other cardiomyopathies: Secondary | ICD-10-CM | POA: Diagnosis not present

## 2016-01-25 LAB — BASIC METABOLIC PANEL
BUN: 41 mg/dL — ABNORMAL HIGH (ref 7–25)
CO2: 25 mmol/L (ref 20–31)
CREATININE: 1.49 mg/dL — AB (ref 0.70–1.33)
Calcium: 8.7 mg/dL (ref 8.6–10.3)
Chloride: 100 mmol/L (ref 98–110)
Glucose, Bld: 108 mg/dL — ABNORMAL HIGH (ref 65–99)
Potassium: 4.3 mmol/L (ref 3.5–5.3)
SODIUM: 137 mmol/L (ref 135–146)

## 2016-01-25 LAB — POCT INR: INR: 1.6

## 2016-01-25 LAB — CBC
HCT: 34.6 % — ABNORMAL LOW (ref 39.0–52.0)
HEMOGLOBIN: 10.5 g/dL — AB (ref 13.0–17.0)
MCH: 21 pg — AB (ref 26.0–34.0)
MCHC: 30.3 g/dL (ref 30.0–36.0)
MCV: 69.1 fL — AB (ref 78.0–100.0)
MPV: 9 fL (ref 8.6–12.4)
PLATELETS: 294 10*3/uL (ref 150–400)
RBC: 5.01 MIL/uL (ref 4.22–5.81)
RDW: 21.5 % — ABNORMAL HIGH (ref 11.5–15.5)
WBC: 7.3 10*3/uL (ref 4.0–10.5)

## 2016-01-25 LAB — URIC ACID: URIC ACID, SERUM: 9 mg/dL — AB (ref 4.0–7.8)

## 2016-01-25 MED ORDER — PREDNISONE 20 MG PO TABS: 60.0000 mg | ORAL_TABLET | Freq: Every day | ORAL | Status: AC

## 2016-01-25 NOTE — Progress Notes (Signed)
Cardiology Office Note   Date:  01/25/2016   ID:  Michael [name] Jun 10, 1961, MRN [id number]  PCP:  [name], MD  Cardiologist:   [name], MD   No chief complaint on file.  F/U of CHF     History of Present Illness: Michael [name] is a 55 y.o. male with a history of mitral valve disease status post bioprosthetic mitral valve replacement in 2012 at [facility] in [place], tricuspid valve repair, CAD, PAF, CKD, OSA, diabetes, HTN, HL, CHF, pulmonary hypertension, anemia, poor adherence with medications. Echo done in [place] in 4/12 with normal LVF (EF 60-65%).   Admitted in 10/16 with AF with RVR and a/c systolic CHF. EF was 30-35%. He was seen back in the office in follow-up but then readmitted 88/28 with a/c systolic CHF. His weight was up about 20 pounds. ACE inhibitor was stopped secondary to worsening creatinine and he was placed on hydralazine, nitrates. Metoprolol was resumed with good rate control.   He has previously not been clear about his medications. We ultimately found out he was taking 2 beta-blockers. We took him off Carvedilol and kept him on Metoprolol. I last saw him 12/07/15. FU BNP was elevated and I increased his Lasix further to 80 mg bid. He has continued to not bring his medications into his visits. It remains unclear what he is taking.  He was lst seen by Kathleen Argue in Feb  Set up for f/u echo  Has not been done yet  He says that his breathing is worse  No CP ALso notes problems with pain in R foot  Like when gout flared      Outpatient Prescriptions Prior to Visit  Medication Sig Dispense Refill  . acetaminophen (TYLENOL) 325 MG tablet Take 650 mg by mouth every 6 (six) hours as needed for headache (pain). Reported on 01/10/2016    . allopurinol (ZYLOPRIM) 100 MG tablet Take 1 tablet (100 mg total) by mouth 2 (two) times daily. 60 tablet 2  . Blood Glucose Monitoring Suppl (ACCU-CHEK AVIVA) device Use as Instructed 3 times daily 1  each 0  . colchicine 0.6 MG tablet Take 2 tablets (1.2 mg) at the onset of a gout flare, may repeat 1 tablet (0.6 mg) in one hour if symptoms persist. 30 tablet 2  . digoxin (LANOXIN) 0.125 MG tablet Take 1 tablet (0.125 mg total) by mouth daily. 30 tablet 11  . docusate sodium (COLACE) 100 MG capsule Take 1 capsule (100 mg total) by mouth 2 (two) times daily. For constipation 60 capsule 0  . furosemide (LASIX) 80 MG tablet Take 1 tablet (80 mg total) by mouth 2 (two) times daily. 60 tablet 3  . glucose blood (ACCU-CHEK AVIVA) test strip Use 3 times daily as directed. 100 each 12  . insulin glargine (LANTUS) 100 UNIT/ML injection Inject 0.2 mLs (20 Units total) into the skin at bedtime. 10 mL 2  . isosorbide mononitrate (IMDUR) 30 MG 24 hr tablet Take 0.5 tablets (15 mg total) by mouth daily. (Patient taking differently: Take 15 mg by mouth daily with supper. ) 30 tablet 11  . Lancet Devices (ACCU-CHEK SOFTCLIX) lancets Use as instructed 3 times daily 1 each 5  . metoprolol (LOPRESSOR) 100 MG tablet Take 100 mg by mouth 2 (two) times daily.    . potassium chloride SA (K-DUR,KLOR-CON) 20 MEQ tablet Take 1 tablet (20 mEq total) by mouth daily. 30 tablet 11  . traMADol (ULTRAM) 50 MG tablet Take 1 tablet (  50 mg total) by mouth every 8 (eight) hours as needed. 30 tablet 0  . Vitamin D, Ergocalciferol, (DRISDOL) 50000 UNITS CAPS capsule Take 50,000 Units by mouth every 7 (seven) days. Reported on 01/10/2016    . warfarin (COUMADIN) 5 MG tablet Take 1-1.5 tablets (5-7.5 mg total) by mouth at bedtime. Take 1 1/2 tablets (7.5 mg) by mouth on Monday and Thursday, take 1 tablet (5 mg) on Sunday, Tuesday, Wednesday, Friday, Saturday 60 tablet 0  . budesonide-formoterol (SYMBICORT) 80-4.5 MCG/ACT inhaler Inhale 2 puffs into the lungs 2 (two) times daily. (Patient not taking: Reported on 01/25/2016) 1 Inhaler 12  . hydrALAZINE (APRESOLINE) 25 MG tablet Take 1 tablet (25 mg total) by mouth 3 (three) times daily.  (Patient not taking: Reported on 01/25/2016) 90 tablet 3  . levalbuterol (XOPENEX) 0.63 MG/3ML nebulizer solution Take 3 mLs (0.63 mg total) by nebulization every 6 (six) hours. (Patient not taking: Reported on 01/25/2016) 3 mL 12   No facility-administered medications prior to visit.     Allergies:   Review of patient's allergies indicates no known allergies.   Past Medical History  Diagnosis Date  . Coronary artery disease   . Diabetes mellitus without complication ([place])   . Hypertension   . Chronic systolic CHF (congestive heart failure) (HCC)     a. prior EF normal; b. Echo 10/16: Mild LVH, EF 30-35%, mitral valve bioprosthesis present without evidence of stenosis or regurgitation, severe LAE, mild RVE with mild to moderately reduced RVSF, moderate RAE  . 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 H/O tricuspid valve repair 2012  . Paroxysmal atrial fibrillation (HCC)     s/p Maze procedure at time of MVR in 2011  . Diabetes mellitus type 2 in obese ([place])   . Obstructive sleep apnea   . Chronic kidney disease (CKD), stage IV (severe) ([place])   . Anemia of chronic disease   . Pulmonary HTN ([place])   . History of echocardiogram     Echo at [facility] in [facility] 4/12: mod LVH, EF 60-65%, mod LAE, tissue MVR ok, mod TR, mod pulmo HTN with RVSP 48 mmHg  . Asthma   . GERD (gastroesophageal reflux disease)     Past Surgical History  Procedure Laterality Date  . Cardiac surgery       Social History:  The patient  reports that he has quit smoking. He does not have any smokeless tobacco history on file. He reports that he does not drink alcohol or use illicit drugs.   Family History:  The patient's family history is negative for Heart attack.    ROS:  Please see the history of present illness. All other systems are reviewed and  Negative to the above problem except as noted.    PHYSICAL EXAM: VS:  BP 124/88 mmHg  Pulse 76  Ht 5' 4" (1.626 m)  Wt 200 lb 1.9 oz (90.774 kg)   BMI 34.33 kg/m2  SpO2 99%  GEN: Obese and in no acute distress HEENT: normal Neck: JVP is increased  carotid bruits, or masses Cardiac:Irreg; no murmurs, rubs, or gallops,Tr edema  Respiratory:  clear to auscultation bilaterally, normal work of breathing GI: soft, nontender, nondistended, + BS  No hepatomegaly  MS: no deformity Moving all extremities    R foot with erythma no dorsum  Tender   Skin: warm and dry, no rash Neuro:  Strength and sensation are intact Psych: euthymic mood, full affect   EKG:  EKG is  not ordered today.   Lipid Panel No results found for: CHOL, TRIG, HDL, CHOLHDL, VLDL, LDLCALC, LDLDIRECT    Wt Readings from Last 3 Encounters:  01/25/16 200 lb 1.9 oz (90.774 kg)  01/24/16 203 lb 3.2 oz (92.171 kg)  01/10/16 199 lb (90.266 kg)      ASSESSMENT AND PLAN:  1  Chronic systolic CHF  Volume appears to be up some  Will recomm lasix 120 tomorrow  Check BNP and BMET today.  Keep on same meds otherwise  Set up for ehco  2.  Afibb  Rate control and coumadin  ? If he would be able to maintain SR  Difficult I think with compliance history  INR on Monday.  3  HTN  Fair control  4.  CKD  BMET today  5  Valvular dz  S/p MVR and TV repair  Ehco pending .    6  ? Gout  Check URic acid  He has had before  Prednisone qd x 3 days  Continue colchicine      Current medicines are reviewed at length with the patient today.  The patient does not have concerns regarding medicines.    Labs/ tests ordered today include:  Orders Placed This Encounter  Procedures  . Brain natriuretic peptide  . Basic metabolic panel  . CBC  . Uric acid  . ECHOCARDIOGRAM COMPLETE      Signed, [name], MD  01/25/2016 6:00 PM    [address], [place], [place]  [address] Phone: [phone]; Fax: [phone]

## 2016-01-25 NOTE — Patient Instructions (Signed)
Your physician has recommended you make the following change in your medication:  1.) start prednisone 60 mg once a day for 3 days 2.) JUST FOR TOMORROW, Saturday, TAKE 3 LASIX PILLS.  THEN TAKE YOUR USUAL DOSE UNTIL WE CALL YOU.  Your physician recommends that you return for lab work in: today (cbc, bmet, bnp, uric acid)  Your physician has requested that you have an echocardiogram. Echocardiography is a painless test that uses sound waves to create images of your heart. It provides your doctor with information about the size and shape of your heart and how well your heart's chambers and valves are working. This procedure takes approximately one hour. There are no restrictions for this procedure.

## 2016-01-26 LAB — BRAIN NATRIURETIC PEPTIDE: BRAIN NATRIURETIC PEPTIDE: 318.1 pg/mL — AB (ref <100)

## 2016-01-29 ENCOUNTER — Telehealth: Payer: Self-pay

## 2016-01-29 NOTE — Telephone Encounter (Signed)
Attempted to contact the patient to check on his status and to inquire if he has picked up his glucometer at his pharmacy. Call placed to # [phone] (H) with the assistance of [name] interpreter # [phone] from [employer]. A voice mail message was left noting that the CM would call back at a later time.

## 2016-02-01 ENCOUNTER — Telehealth: Payer: Self-pay

## 2016-02-01 ENCOUNTER — Ambulatory Visit (INDEPENDENT_AMBULATORY_CARE_PROVIDER_SITE_OTHER): Payer: Medicare Other | Admitting: Pharmacist

## 2016-02-01 DIAGNOSIS — I059 Rheumatic mitral valve disease, unspecified: Secondary | ICD-10-CM

## 2016-02-01 DIAGNOSIS — I4891 Unspecified atrial fibrillation: Secondary | ICD-10-CM | POA: Diagnosis not present

## 2016-02-01 DIAGNOSIS — I079 Rheumatic tricuspid valve disease, unspecified: Secondary | ICD-10-CM

## 2016-02-01 DIAGNOSIS — Z5181 Encounter for therapeutic drug level monitoring: Secondary | ICD-10-CM | POA: Diagnosis not present

## 2016-02-01 LAB — POCT INR: INR: 1.9

## 2016-02-01 NOTE — Telephone Encounter (Signed)
This Case Manager placed call to patient to inquire about status and to determine if patient picked up glucometer and testing supplies from [employer]. Patient indicated he was "doing okay." He indicated he had glucometer but did not have test strips or lancets because they were not covered by his insurance.  Call placed to [facility] on [address] [phone]) to ensure they had all needed documentation and insurance information for patient to get his diabetes testing supplies. Spoke with Pharmacy tech who indicated patient picked up his glucometer, but did not pick up his test strips or lancets because the pharmacy did not have appropriate diagnosis code for diabetes monitoring supplies.  Appropriate diagnosis code given to Pharmacy tech.  Pharmacy tech indicated patient's "insurance went through"; therefore, patient now has insurance coverage for his test strips and lancets.   Patient informed that this Case Manager called [employer], and he now has coverage for his test strips and lancets. Patient verbalized understanding. Inquired if patient knew how to use his glucometer, and patient indicated he would like teaching on how to use his glucometer.  Diabetes education appointment made with Nicoletta Ba, Clinical Pharmacist, on 02/08/16 at 1500.  Patient appreciative of appointment. Informed patient to bring glucometer and testing supplies to his appointment. Also discussed importance of medication compliance. Patient indicated he was being compliant with his medications and indicated he did not have any questions about his medications. In addition, educated patient on importance of eating a low sodium diet and limiting fluid intake to <2L/day. Patient verbalized understanding. No additional needs/concerns identified. Call made with aide of [employer] (Interpreter [phone]).

## 2016-02-04 ENCOUNTER — Other Ambulatory Visit: Payer: Self-pay | Admitting: Pharmacist

## 2016-02-04 ENCOUNTER — Telehealth: Payer: Self-pay

## 2016-02-04 DIAGNOSIS — N183 Chronic kidney disease, stage 3 (moderate): Secondary | ICD-10-CM | POA: Diagnosis not present

## 2016-02-04 DIAGNOSIS — I10 Essential (primary) hypertension: Secondary | ICD-10-CM | POA: Diagnosis not present

## 2016-02-04 DIAGNOSIS — I509 Heart failure, unspecified: Secondary | ICD-10-CM | POA: Diagnosis not present

## 2016-02-04 DIAGNOSIS — Z794 Long term (current) use of insulin: Principal | ICD-10-CM

## 2016-02-04 DIAGNOSIS — E119 Type 2 diabetes mellitus without complications: Secondary | ICD-10-CM | POA: Diagnosis not present

## 2016-02-04 MED ORDER — GLUCOSE BLOOD VI STRP: ORAL_STRIP | Status: DC

## 2016-02-04 NOTE — Telephone Encounter (Signed)
Call placed to the patient to check on his status and to inquire if he has picked up his lancets and test strip as as well as to confirm his appointment with Nicoletta Ba , [facility] for tomorrow, 02/05/16 @ 1500. Call was placed with the assistance of [name] interpreter, Raquel Sarna # [phone] with [facility] [employer]  He said that he picked up the lancets but the pharmacy did not give him the test strips. He then noted that he will be at the appointment tomorrow and has transportation to the clinic.   No problems/questions to report.  Instructed him to bring his meter and supplies to his appointment tomorrow and he stated that he understood. CM to call [place] to check on the status of the glucometer test strips.   Call placed to [place] # [phone] and spoke to May in the Pharmacy. She said that a date is needed on the prescription. It was faxed to [facility] on 02/01/16 and can be faxed back to [facility] # [phone]. The prescription was dated and was faxed back to [facility].   Nicoletta Ba, [facility] notified that the patient is planning to come to his appointment tomorrow and he has his meter and lancets but still needs to pick up the test strips.  Kerby Moors , [facility] notified that the patient may need to use some test strips from the [facility] for his teaching tomorrow if he does not pick them up at his pharmacy this afternoon.  Call placed to the patient again with the assistance of [place] interpreter # [phone] with [facility] [employer] and the patient was informed that the prescription for the test strips has been sent to his pharmacy and he can check later today to see if it is ready for pick up. He stated that he would get it and was appreciated the call.  Instructed him again to bring his meter and testing supplies to his appointment at [facility] tomorrow and he stated that he would.

## 2016-02-05 ENCOUNTER — Ambulatory Visit: Payer: Medicare Other | Attending: Family Medicine | Admitting: Pharmacist

## 2016-02-05 DIAGNOSIS — Z794 Long term (current) use of insulin: Secondary | ICD-10-CM

## 2016-02-05 DIAGNOSIS — E119 Type 2 diabetes mellitus without complications: Secondary | ICD-10-CM | POA: Diagnosis not present

## 2016-02-05 NOTE — Patient Instructions (Signed)
Blood sugar goals:  First thing in the morning: less than 120  After you eat: less than 180  Follow up with Dr. Jarold Song  Blood Glucose Monitoring, Adult Monitoring your blood glucose (also know as blood sugar) helps you to manage your diabetes. It also helps you and your health care provider monitor your diabetes and determine how well your treatment plan is working. WHY SHOULD YOU MONITOR YOUR BLOOD GLUCOSE?  It can help you understand how food, exercise, and medicine affect your blood glucose.  It allows you to know what your blood glucose is at any given moment. You can quickly tell if you are having low blood glucose (hypoglycemia) or high blood glucose (hyperglycemia).  It can help you and your health care provider know how to adjust your medicines.  It can help you understand how to manage an illness or adjust medicine for exercise. WHEN SHOULD YOU TEST? Your health care provider will help you decide how often you should check your blood glucose. This may depend on the type of diabetes you have, your diabetes control, or the types of medicines you are taking. Be sure to write down all of your blood glucose readings so that this information can be reviewed with your health care provider. See below for examples of testing times that your health care provider may suggest. Type 1 Diabetes  Test at least 2 times per day if your diabetes is well controlled, if you are using an insulin pump, or if you perform multiple daily injections.  If your diabetes is not well controlled or if you are sick, you may need to test more often.  It is a good idea to also test:  Before every insulin injection.  Before and after exercise.  Between meals and 2 hours after a meal.  Occasionally between 2:00 a.m. and 3:00 a.m. Type 2 Diabetes  If you are taking insulin, test at least 2 times per day. However, it is best to test before every insulin injection.  If you take medicines by mouth (orally),  test 2 times a day.  If you are on a controlled diet, test once a day.  If your diabetes is not well controlled or if you are sick, you may need to monitor more often. HOW TO MONITOR YOUR BLOOD GLUCOSE Supplies Needed  Blood glucose meter.  Test strips for your meter. Each meter has its own strips. You must use the strips that go with your own meter.  A pricking needle (lancet).  A device that holds the lancet (lancing device).  A journal or log book to write down your results. Procedure  Wash your hands with soap and water. Alcohol is not preferred.  Prick the side of your finger (not the tip) with the lancet.  Gently milk the finger until a small drop of blood appears.  Follow the instructions that come with your meter for inserting the test strip, applying blood to the strip, and using your blood glucose meter. Other Areas to Get Blood for Testing Some meters allow you to use other areas of your body (other than your finger) to test your blood. These areas are called alternative sites. The most common alternative sites are:  The forearm.  The thigh.  The back area of the lower leg.  The palm of the hand. The blood flow in these areas is slower. Therefore, the blood glucose values you get may be delayed, and the numbers are different from what you would get from your  fingers. Do not use alternative sites if you think you are having hypoglycemia. Your reading will not be accurate. Always use a finger if you are having hypoglycemia. Also, if you cannot feel your lows (hypoglycemia unawareness), always use your fingers for your blood glucose checks. ADDITIONAL TIPS FOR GLUCOSE MONITORING  Do not reuse lancets.  Always carry your supplies with you.  All blood glucose meters have a 24-hour "hotline" number to call if you have questions or need help.  Adjust (calibrate) your blood glucose meter with a control solution after finishing a few boxes of strips. BLOOD GLUCOSE  RECORD KEEPING It is a good idea to keep a daily record or log of your blood glucose readings. Most glucose meters, if not all, keep your glucose records stored in the meter. Some meters come with the ability to download your records to your home computer. Keeping a record of your blood glucose readings is especially helpful if you are wanting to look for patterns. Make notes to go along with the blood glucose readings because you might forget what happened at that exact time. Keeping good records helps you and your health care provider to work together to achieve good diabetes management.    This information is not intended to replace advice given to you by your health care provider. Make sure you discuss any questions you have with your health care provider.   Document Released: 11/06/2003 Document Revised: 11/24/2014 Document Reviewed: 03/28/2013 Elsevier Interactive Patient Education [employer].

## 2016-02-05 NOTE — Progress Notes (Signed)
S:    Patient arrives in good spirits with a Teacher, adult education.  Presents for blood glucose meter at the request of Dr. Jarold Song. Patient was referred on 01/24/16.  Patient was last seen by Primary Care Provider on 01/24/16.   Patient has never used a blood glucose meter before.    O:  Lab Results  Component Value Date   HGBA1C 7.3* 12/29/2015   There were no vitals filed for this visit.  POCT glucose = 184 (post-prandial)  A/P: Diabetes longstanding/newly diagnosed currently uncontrolled based on A1c of 7.3. Patient denies hypoglycemic events and is able to verbalize appropriate hypoglycemia management plan. Patient reports adherence with medication. Control is suboptimal due to inability to monitor at home.  Patient educated on the use of the Accu-chek blood glucose meter. Patient was able to demonstrate use without assistance. All questions answered. Blood glucose goals for fasting and post-prandial readings were discussed. Patient verbalized understanding.   Next A1C anticipated May 2017.    Written patient instructions provided.  Total time in face to face counseling 15 minutes.   Follow up in Pharmacist Clinic Visit PRN.   Patient seen with Jamie Brookes, PharmD Candidate

## 2016-02-08 ENCOUNTER — Other Ambulatory Visit: Payer: Self-pay

## 2016-02-08 ENCOUNTER — Ambulatory Visit (HOSPITAL_COMMUNITY): Payer: Medicare Other | Attending: Cardiovascular Disease

## 2016-02-08 DIAGNOSIS — I5022 Chronic systolic (congestive) heart failure: Secondary | ICD-10-CM | POA: Diagnosis not present

## 2016-02-08 DIAGNOSIS — I420 Dilated cardiomyopathy: Secondary | ICD-10-CM | POA: Diagnosis not present

## 2016-02-08 DIAGNOSIS — M109 Gout, unspecified: Secondary | ICD-10-CM | POA: Diagnosis not present

## 2016-02-12 ENCOUNTER — Encounter: Payer: Self-pay | Admitting: Family Medicine

## 2016-02-12 ENCOUNTER — Ambulatory Visit: Payer: Medicare Other | Attending: Family Medicine | Admitting: Family Medicine

## 2016-02-12 VITALS — BP 121/76 | HR 108 | Temp 97.7°F | Resp 15 | Ht 64.0 in | Wt 202.8 lb

## 2016-02-12 DIAGNOSIS — I4891 Unspecified atrial fibrillation: Secondary | ICD-10-CM | POA: Diagnosis not present

## 2016-02-12 DIAGNOSIS — N189 Chronic kidney disease, unspecified: Secondary | ICD-10-CM | POA: Insufficient documentation

## 2016-02-12 DIAGNOSIS — Z79899 Other long term (current) drug therapy: Secondary | ICD-10-CM | POA: Diagnosis not present

## 2016-02-12 DIAGNOSIS — E785 Hyperlipidemia, unspecified: Secondary | ICD-10-CM | POA: Diagnosis not present

## 2016-02-12 DIAGNOSIS — K120 Recurrent oral aphthae: Secondary | ICD-10-CM

## 2016-02-12 DIAGNOSIS — Z794 Long term (current) use of insulin: Secondary | ICD-10-CM | POA: Insufficient documentation

## 2016-02-12 DIAGNOSIS — I129 Hypertensive chronic kidney disease with stage 1 through stage 4 chronic kidney disease, or unspecified chronic kidney disease: Secondary | ICD-10-CM | POA: Diagnosis not present

## 2016-02-12 DIAGNOSIS — Z7901 Long term (current) use of anticoagulants: Secondary | ICD-10-CM | POA: Diagnosis not present

## 2016-02-12 DIAGNOSIS — Z952 Presence of prosthetic heart valve: Secondary | ICD-10-CM | POA: Diagnosis not present

## 2016-02-12 DIAGNOSIS — E119 Type 2 diabetes mellitus without complications: Secondary | ICD-10-CM

## 2016-02-12 LAB — GLUCOSE, POCT (MANUAL RESULT ENTRY): POC Glucose: 145 mg/dl — AB (ref 70–99)

## 2016-02-12 MED ORDER — LIDOCAINE VISCOUS 2 % MT SOLN: 10.0000 mL | Freq: Four times a day (QID) | OROMUCOSAL | Status: DC | PRN

## 2016-02-12 NOTE — Progress Notes (Signed)
Patient "burned" his tongue but is not able to describe what happened He states he cannot eat because of it

## 2016-02-12 NOTE — Progress Notes (Signed)
Subjective:  Patient ID: Michael [name], male    DOB: 1960/12/07  Age: 55 y.o. MRN: [id number]  CC: burned tongue   HPI Michael [name] is a 55 year old [name] male seen with the aid of a video interpreter with a history of Type 2DM (A1c 7.3), hypertension, hyperlipidemia, mitral valve replacement with porcine valve, tricuspid valve repair, atrial fibrillation (on anticoagulation with Coumadin),Chronic systolic heartfailure  (EF 30-35% from 08/2015), CKD (recently seen by [facility]) who comes in today for an acute visit complaining of "burning his tongue"on 2 occasions initially 2 weeks ago and again 2 days ago.  Complains burning sensation is worse when he eats more so with spicy foods. Denies any tooth or jaw pain and has no fever. He has not used any OTC medicines.   Outpatient Prescriptions Prior to Visit  Medication Sig Dispense Refill  . acetaminophen (TYLENOL) 325 MG tablet Take 650 mg by mouth every 6 (six) hours as needed for headache (pain). Reported on 01/10/2016    . allopurinol (ZYLOPRIM) 100 MG tablet Take 1 tablet (100 mg total) by mouth 2 (two) times daily. 60 tablet 2  . Blood Glucose Monitoring Suppl (ACCU-CHEK AVIVA) device Use as Instructed 3 times daily 1 each 0  . colchicine 0.6 MG tablet Take 2 tablets (1.2 mg) at the onset of a gout flare, may repeat 1 tablet (0.6 mg) in one hour if symptoms persist. 30 tablet 2  . digoxin (LANOXIN) 0.125 MG tablet Take 1 tablet (0.125 mg total) by mouth daily. 30 tablet 11  . diphenhydramine-acetaminophen (TYLENOL PM) 25-500 MG TABS tablet Take 1 tablet by mouth at bedtime as needed.    . docusate sodium (COLACE) 100 MG capsule Take 1 capsule (100 mg total) by mouth 2 (two) times daily. For constipation 60 capsule 0  . furosemide (LASIX) 80 MG tablet Take 1 tablet (80 mg total) by mouth 2 (two) times daily. 60 tablet 3  . glucose blood (ACCU-CHEK AVIVA) test strip Use 3 times daily as directed. 100 each 5  .  insulin glargine (LANTUS) 100 UNIT/ML injection Inject 0.2 mLs (20 Units total) into the skin at bedtime. 10 mL 2  . isosorbide mononitrate (IMDUR) 30 MG 24 hr tablet Take 0.5 tablets (15 mg total) by mouth daily. (Patient taking differently: Take 15 mg by mouth daily with supper. ) 30 tablet 11  . Lancet Devices (ACCU-CHEK SOFTCLIX) lancets Use as instructed 3 times daily 1 each 5  . metoprolol (LOPRESSOR) 100 MG tablet Take 100 mg by mouth 2 (two) times daily.    . potassium chloride SA (K-DUR,KLOR-CON) 20 MEQ tablet Take 1 tablet (20 mEq total) by mouth daily. 30 tablet 11  . traMADol (ULTRAM) 50 MG tablet Take 1 tablet (50 mg total) by mouth every 8 (eight) hours as needed. 30 tablet 0  . Vitamin D, Ergocalciferol, (DRISDOL) 50000 UNITS CAPS capsule Take 50,000 Units by mouth every 7 (seven) days. Reported on 01/10/2016    . warfarin (COUMADIN) 5 MG tablet Take 1-1.5 tablets (5-7.5 mg total) by mouth at bedtime. Take 1 1/2 tablets (7.5 mg) by mouth on Monday and Thursday, take 1 tablet (5 mg) on Sunday, Tuesday, Wednesday, Friday, Saturday 60 tablet 0   No facility-administered medications prior to visit.    ROS Review of Systems  Constitutional: Negative for activity change and appetite change.  HENT: Negative for sinus pressure and sore throat.   Respiratory: Negative for chest tightness, shortness of breath and wheezing.   Cardiovascular:  Negative for chest pain and palpitations.  Gastrointestinal: Negative for abdominal pain, constipation and abdominal distention.  Genitourinary: Negative.   Musculoskeletal: Negative.   Psychiatric/Behavioral: Negative for behavioral problems and dysphoric mood.    Objective:  BP 121/76 mmHg  Pulse 108  Temp(Src) 97.7 F (36.5 C)  Resp 15  Ht 5\' 4"  (1.626 m)  Wt 202 lb 12.8 oz (91.989 kg)  BMI 34.79 kg/m2  SpO2 97%  BP/Weight 02/12/2016 [id number] [id number]  Systolic BP 123XX123 [id number]  Diastolic BP 76 88 95  Wt. (Lbs) 202.8 200.12 203.2  BMI  34.79 34.33 34.86      Physical Exam  Constitutional: He is oriented to person, place, and time. He appears well-developed and well-nourished.  HENT:  Aphthous ulcers on left and right lateral aspect of tongue  Cardiovascular: Normal rate, normal heart sounds and intact distal pulses.   No murmur heard. Pulmonary/Chest: Effort normal and breath sounds normal. He has no wheezes. He has no rales. He exhibits no tenderness.  Abdominal: Soft. Bowel sounds are normal. He exhibits no distension and no mass. There is no tenderness.  Musculoskeletal: Normal range of motion.  Neurological: He is alert and oriented to person, place, and time.     Assessment & Plan:   1. Type 2 diabetes mellitus without complication, with long-term current use of insulin (HCC) Controlled with A1c of 7.3 - Glucose (CBG)  2. Aphthous ulcer of tongue Patient advised to spit out solution after gargling given his cardiac history. Side effects discussed. - lidocaine (XYLOCAINE) 2 % solution; Use as directed 10 mLs in the mouth or throat every 6 (six) hours as needed for mouth pain.  Dispense: 100 mL; Refill: 0   Meds ordered this encounter  Medications  . lidocaine (XYLOCAINE) 2 % solution    Sig: Use as directed 10 mLs in the mouth or throat every 6 (six) hours as needed for mouth pain.    Dispense:  100 mL    Refill:  0    Follow-up: Return in about 3 months (around 05/14/2016) for follow up of chf.   [name] MD

## 2016-02-19 ENCOUNTER — Ambulatory Visit (INDEPENDENT_AMBULATORY_CARE_PROVIDER_SITE_OTHER): Payer: Medicare Other | Admitting: *Deleted

## 2016-02-19 ENCOUNTER — Inpatient Hospital Stay (HOSPITAL_COMMUNITY)
Admission: EM | Admit: 2016-02-19 | Discharge: 2016-02-21 | DRG: 309 | Disposition: A | Discharge status: Home / Self Care | Payer: Medicare Other | Attending: Internal Medicine | Admitting: Internal Medicine

## 2016-02-19 ENCOUNTER — Emergency Department (HOSPITAL_COMMUNITY): Payer: Medicare Other

## 2016-02-19 ENCOUNTER — Encounter (HOSPITAL_COMMUNITY): Payer: Self-pay | Admitting: Emergency Medicine

## 2016-02-19 DIAGNOSIS — I130 Hypertensive heart and chronic kidney disease with heart failure and stage 1 through stage 4 chronic kidney disease, or unspecified chronic kidney disease: Secondary | ICD-10-CM | POA: Diagnosis present

## 2016-02-19 DIAGNOSIS — Z794 Long term (current) use of insulin: Secondary | ICD-10-CM | POA: Diagnosis not present

## 2016-02-19 DIAGNOSIS — Z6836 Body mass index (BMI) 36.0-36.9, adult: Secondary | ICD-10-CM

## 2016-02-19 DIAGNOSIS — I482 Chronic atrial fibrillation: Secondary | ICD-10-CM | POA: Diagnosis present

## 2016-02-19 DIAGNOSIS — I5022 Chronic systolic (congestive) heart failure: Secondary | ICD-10-CM | POA: Diagnosis present

## 2016-02-19 DIAGNOSIS — G4733 Obstructive sleep apnea (adult) (pediatric): Secondary | ICD-10-CM | POA: Diagnosis present

## 2016-02-19 DIAGNOSIS — K219 Gastro-esophageal reflux disease without esophagitis: Secondary | ICD-10-CM | POA: Diagnosis present

## 2016-02-19 DIAGNOSIS — M109 Gout, unspecified: Secondary | ICD-10-CM | POA: Diagnosis present

## 2016-02-19 DIAGNOSIS — I059 Rheumatic mitral valve disease, unspecified: Secondary | ICD-10-CM | POA: Diagnosis not present

## 2016-02-19 DIAGNOSIS — I10 Essential (primary) hypertension: Secondary | ICD-10-CM | POA: Diagnosis present

## 2016-02-19 DIAGNOSIS — I452 Bifascicular block: Secondary | ICD-10-CM | POA: Diagnosis present

## 2016-02-19 DIAGNOSIS — I4891 Unspecified atrial fibrillation: Secondary | ICD-10-CM | POA: Diagnosis not present

## 2016-02-19 DIAGNOSIS — Z5181 Encounter for therapeutic drug level monitoring: Secondary | ICD-10-CM

## 2016-02-19 DIAGNOSIS — E669 Obesity, unspecified: Secondary | ICD-10-CM | POA: Diagnosis present

## 2016-02-19 DIAGNOSIS — E1122 Type 2 diabetes mellitus with diabetic chronic kidney disease: Secondary | ICD-10-CM | POA: Diagnosis present

## 2016-02-19 DIAGNOSIS — Z952 Presence of prosthetic heart valve: Secondary | ICD-10-CM

## 2016-02-19 DIAGNOSIS — R319 Hematuria, unspecified: Secondary | ICD-10-CM | POA: Diagnosis present

## 2016-02-19 DIAGNOSIS — J45909 Unspecified asthma, uncomplicated: Secondary | ICD-10-CM | POA: Diagnosis present

## 2016-02-19 DIAGNOSIS — N184 Chronic kidney disease, stage 4 (severe): Secondary | ICD-10-CM | POA: Diagnosis present

## 2016-02-19 DIAGNOSIS — E1129 Type 2 diabetes mellitus with other diabetic kidney complication: Secondary | ICD-10-CM

## 2016-02-19 DIAGNOSIS — I480 Paroxysmal atrial fibrillation: Principal | ICD-10-CM | POA: Diagnosis present

## 2016-02-19 DIAGNOSIS — I079 Rheumatic tricuspid valve disease, unspecified: Secondary | ICD-10-CM

## 2016-02-19 DIAGNOSIS — Z23 Encounter for immunization: Secondary | ICD-10-CM | POA: Diagnosis not present

## 2016-02-19 DIAGNOSIS — I2510 Atherosclerotic heart disease of native coronary artery without angina pectoris: Secondary | ICD-10-CM | POA: Diagnosis present

## 2016-02-19 DIAGNOSIS — R404 Transient alteration of awareness: Secondary | ICD-10-CM | POA: Diagnosis not present

## 2016-02-19 DIAGNOSIS — Z7901 Long term (current) use of anticoagulants: Secondary | ICD-10-CM | POA: Diagnosis not present

## 2016-02-19 DIAGNOSIS — Z953 Presence of xenogenic heart valve: Secondary | ICD-10-CM | POA: Diagnosis not present

## 2016-02-19 DIAGNOSIS — I517 Cardiomegaly: Secondary | ICD-10-CM | POA: Diagnosis not present

## 2016-02-19 DIAGNOSIS — R531 Weakness: Secondary | ICD-10-CM | POA: Diagnosis present

## 2016-02-19 DIAGNOSIS — D638 Anemia in other chronic diseases classified elsewhere: Secondary | ICD-10-CM | POA: Diagnosis present

## 2016-02-19 DIAGNOSIS — Z79899 Other long term (current) drug therapy: Secondary | ICD-10-CM | POA: Diagnosis not present

## 2016-02-19 DIAGNOSIS — Z9119 Patient's noncompliance with other medical treatment and regimen: Secondary | ICD-10-CM | POA: Diagnosis not present

## 2016-02-19 DIAGNOSIS — Z87891 Personal history of nicotine dependence: Secondary | ICD-10-CM | POA: Diagnosis not present

## 2016-02-19 DIAGNOSIS — R0602 Shortness of breath: Secondary | ICD-10-CM | POA: Diagnosis not present

## 2016-02-19 HISTORY — DX: Unspecified atrial fibrillation: I48.91

## 2016-02-19 LAB — TROPONIN I
TROPONIN I: 0.03 ng/mL (ref <0.031)
TROPONIN I: 0.03 ng/mL (ref <0.031)

## 2016-02-19 LAB — COMPREHENSIVE METABOLIC PANEL
ALT: 22 U/L (ref 17–63)
AST: 46 U/L — AB (ref 15–41)
Albumin: 3.2 g/dL — ABNORMAL LOW (ref 3.5–5.0)
Alkaline Phosphatase: 168 U/L — ABNORMAL HIGH (ref 38–126)
Anion gap: 14 (ref 5–15)
BILIRUBIN TOTAL: 2.7 mg/dL — AB (ref 0.3–1.2)
BUN: 48 mg/dL — AB (ref 6–20)
CALCIUM: 8.8 mg/dL — AB (ref 8.9–10.3)
CO2: 23 mmol/L (ref 22–32)
Chloride: 101 mmol/L (ref 101–111)
Creatinine, Ser: 2.33 mg/dL — ABNORMAL HIGH (ref 0.61–1.24)
GFR calc Af Amer: 34 mL/min — ABNORMAL LOW (ref >60)
GFR, EST NON AFRICAN AMERICAN: 30 mL/min — AB (ref >60)
Glucose, Bld: 150 mg/dL — ABNORMAL HIGH (ref 65–99)
Potassium: 4.4 mmol/L (ref 3.5–5.1)
Sodium: 138 mmol/L (ref 135–145)
TOTAL PROTEIN: 7.3 g/dL (ref 6.5–8.1)

## 2016-02-19 LAB — URINALYSIS, ROUTINE W REFLEX MICROSCOPIC
GLUCOSE, UA: NEGATIVE mg/dL
KETONES UR: NEGATIVE mg/dL
Leukocytes, UA: NEGATIVE
Nitrite: NEGATIVE
PH: 5 (ref 5.0–8.0)
Protein, ur: 100 mg/dL — AB
SPECIFIC GRAVITY, URINE: 1.017 (ref 1.005–1.030)

## 2016-02-19 LAB — URINE MICROSCOPIC-ADD ON

## 2016-02-19 LAB — CBC WITH DIFFERENTIAL/PLATELET
BASOS ABS: 0.1 10*3/uL (ref 0.0–0.1)
Basophils Relative: 1 %
EOS ABS: 0.2 10*3/uL (ref 0.0–0.7)
Eosinophils Relative: 3 %
HCT: 32 % — ABNORMAL LOW (ref 39.0–52.0)
Hemoglobin: 10.1 g/dL — ABNORMAL LOW (ref 13.0–17.0)
LYMPHS ABS: 1.5 10*3/uL (ref 0.7–4.0)
Lymphocytes Relative: 19 %
MCH: 21.7 pg — ABNORMAL LOW (ref 26.0–34.0)
MCHC: 31.6 g/dL (ref 30.0–36.0)
MCV: 68.8 fL — AB (ref 78.0–100.0)
MONO ABS: 1.2 10*3/uL — AB (ref 0.1–1.0)
MONOS PCT: 15 %
Neutro Abs: 4.8 10*3/uL (ref 1.7–7.7)
Neutrophils Relative %: 62 %
PLATELETS: 327 10*3/uL (ref 150–400)
RBC: 4.65 MIL/uL (ref 4.22–5.81)
RDW: 23.5 % — ABNORMAL HIGH (ref 11.5–15.5)
WBC: 7.8 10*3/uL (ref 4.0–10.5)

## 2016-02-19 LAB — BRAIN NATRIURETIC PEPTIDE: B NATRIURETIC PEPTIDE 5: 533.9 pg/mL — AB (ref 0.0–100.0)

## 2016-02-19 LAB — MRSA PCR SCREENING: MRSA BY PCR: NEGATIVE

## 2016-02-19 LAB — GLUCOSE, CAPILLARY: GLUCOSE-CAPILLARY: 143 mg/dL — AB (ref 65–99)

## 2016-02-19 LAB — CK: CK TOTAL: 141 U/L (ref 49–397)

## 2016-02-19 LAB — DIGOXIN LEVEL: Digoxin Level: <0.2 ng/mL — ABNORMAL LOW (ref 0.8–2.0)

## 2016-02-19 LAB — PROTIME-INR
INR: 1.44 (ref 0.00–1.49)
PROTHROMBIN TIME: 17.6 s — AB (ref 11.6–15.2)

## 2016-02-19 LAB — POCT INR: INR: 1.4

## 2016-02-19 MED ORDER — WARFARIN - PHARMACIST DOSING INPATIENT: Freq: Every day | Status: DC

## 2016-02-19 MED ORDER — VITAMIN D (ERGOCALCIFEROL) 1.25 MG (50000 UNIT) PO CAPS: 50000.0000 [IU] | ORAL_CAPSULE | ORAL | Status: DC

## 2016-02-19 MED ORDER — LIDOCAINE VISCOUS 2 % MT SOLN
10.0000 mL | Freq: Four times a day (QID) | OROMUCOSAL | Status: DC | PRN
  Filled 2016-02-19: qty 10

## 2016-02-19 MED ORDER — INSULIN ASPART 100 UNIT/ML ~~LOC~~ SOLN
0.0000 [IU] | Freq: Three times a day (TID) | SUBCUTANEOUS | Status: DC
  Administered 2016-02-20 – 2016-02-21 (×3): 2 [IU] via SUBCUTANEOUS

## 2016-02-19 MED ORDER — POTASSIUM CHLORIDE CRYS ER 20 MEQ PO TBCR
20.0000 meq | EXTENDED_RELEASE_TABLET | Freq: Every day | ORAL | Status: DC
  Administered 2016-02-19 – 2016-02-21 (×3): 20 meq via ORAL
  Filled 2016-02-19 (×3): qty 1

## 2016-02-19 MED ORDER — DIGOXIN 125 MCG PO TABS
0.1250 mg | ORAL_TABLET | Freq: Every day | ORAL | Status: DC
  Administered 2016-02-20 – 2016-02-21 (×2): 0.125 mg via ORAL
  Filled 2016-02-19 (×2): qty 1

## 2016-02-19 MED ORDER — INSULIN GLARGINE 100 UNIT/ML ~~LOC~~ SOLN
20.0000 [IU] | Freq: Every day | SUBCUTANEOUS | Status: DC
  Administered 2016-02-19 – 2016-02-21 (×2): 20 [IU] via SUBCUTANEOUS
  Filled 2016-02-19 (×5): qty 0.2

## 2016-02-19 MED ORDER — WARFARIN SODIUM 7.5 MG PO TABS
7.5000 mg | ORAL_TABLET | Freq: Once | ORAL | Status: AC
  Administered 2016-02-19: 7.5 mg via ORAL
  Filled 2016-02-19: qty 1

## 2016-02-19 MED ORDER — DILTIAZEM LOAD VIA INFUSION
10.0000 mg | Freq: Once | INTRAVENOUS | Status: AC
  Administered 2016-02-19: 10 mg via INTRAVENOUS
  Filled 2016-02-19: qty 10

## 2016-02-19 MED ORDER — METOPROLOL TARTRATE 100 MG PO TABS
100.0000 mg | ORAL_TABLET | Freq: Two times a day (BID) | ORAL | Status: DC
  Administered 2016-02-20 – 2016-02-21 (×3): 100 mg via ORAL
  Filled 2016-02-19: qty 2
  Filled 2016-02-19 (×2): qty 1

## 2016-02-19 MED ORDER — DEXTROSE 5 % IV SOLN
5.0000 mg/h | INTRAVENOUS | Status: DC
  Administered 2016-02-19: 5 mg/h via INTRAVENOUS
  Filled 2016-02-19: qty 100

## 2016-02-19 MED ORDER — DILTIAZEM HCL 25 MG/5ML IV SOLN
20.0000 mg | Freq: Once | INTRAVENOUS | Status: AC
  Administered 2016-02-19: 20 mg via INTRAVENOUS
  Filled 2016-02-19: qty 5

## 2016-02-19 MED ORDER — SODIUM CHLORIDE 0.9 % IV BOLUS (SEPSIS)
500.0000 mL | Freq: Once | INTRAVENOUS | Status: AC
  Administered 2016-02-19: 500 mL via INTRAVENOUS

## 2016-02-19 MED ORDER — DOCUSATE SODIUM 100 MG PO CAPS
100.0000 mg | ORAL_CAPSULE | Freq: Two times a day (BID) | ORAL | Status: DC
  Administered 2016-02-19 – 2016-02-21 (×3): 100 mg via ORAL
  Filled 2016-02-19 (×4): qty 1

## 2016-02-19 MED ORDER — ACETAMINOPHEN 325 MG PO TABS
650.0000 mg | ORAL_TABLET | Freq: Four times a day (QID) | ORAL | Status: DC | PRN
  Administered 2016-02-21: 650 mg via ORAL
  Filled 2016-02-19: qty 2

## 2016-02-19 MED ORDER — DIGOXIN 0.25 MG/ML IJ SOLN
0.5000 mg | Freq: Once | INTRAMUSCULAR | Status: AC
  Administered 2016-02-19: 0.5 mg via INTRAVENOUS
  Filled 2016-02-19: qty 2

## 2016-02-19 MED ORDER — PNEUMOCOCCAL VAC POLYVALENT 25 MCG/0.5ML IJ INJ
0.5000 mL | INJECTION | INTRAMUSCULAR | Status: AC
  Administered 2016-02-20: 0.5 mL via INTRAMUSCULAR
  Filled 2016-02-19: qty 0.5

## 2016-02-19 MED ORDER — ALLOPURINOL 100 MG PO TABS
100.0000 mg | ORAL_TABLET | Freq: Two times a day (BID) | ORAL | Status: DC
  Administered 2016-02-19 – 2016-02-21 (×4): 100 mg via ORAL
  Filled 2016-02-19 (×4): qty 1

## 2016-02-19 MED ORDER — WARFARIN SODIUM 5 MG PO TABS: 5.0000 mg | ORAL_TABLET | Freq: Every day | ORAL | Status: DC

## 2016-02-19 MED ORDER — METOPROLOL TARTRATE 25 MG PO TABS
100.0000 mg | ORAL_TABLET | Freq: Once | ORAL | Status: AC
  Administered 2016-02-19: 100 mg via ORAL
  Filled 2016-02-19: qty 4

## 2016-02-19 MED ORDER — TRAMADOL HCL 50 MG PO TABS
50.0000 mg | ORAL_TABLET | Freq: Four times a day (QID) | ORAL | Status: DC | PRN
Start: 2016-02-19 — End: 2016-02-21

## 2016-02-19 MED ORDER — ISOSORBIDE MONONITRATE ER 30 MG PO TB24
15.0000 mg | ORAL_TABLET | Freq: Every day | ORAL | Status: DC
  Administered 2016-02-20: 15 mg via ORAL
  Filled 2016-02-19: qty 1

## 2016-02-19 NOTE — ED Notes (Signed)
Patient given urinal for sample.

## 2016-02-19 NOTE — Progress Notes (Signed)
ANTICOAGULATION CONSULT NOTE - Initial Consult  Pharmacy Consult for Coumadin Indication: atrial fibrillation  No Known Allergies  Patient Measurements: Height: 5\' 2"  (157.5 cm) Weight: 200 lb 3.2 oz (90.81 kg) IBW/kg (Calculated) : 54.6   Vital Signs: Temp: 97.7 F (36.5 C) (04/04 1509) Temp Source: Oral (04/04 1509) BP: 107/71 mmHg (04/04 1940) Pulse Rate: 72 (04/04 1940)  Labs:  Recent Labs  02/19/16 1354 02/19/16 1528 02/19/16 1802  HGB  --  10.1*  --   HCT  --  32.0*  --   PLT  --  327  --   LABPROT  --  17.6*  --   INR 1.4 1.44  --   CREATININE  --  2.33*  --   CKTOTAL  --   --  141  TROPONINI  --  0.03  --     Estimated Creatinine Clearance: 34.6 mL/min (by C-G formula based on Cr of 2.33).   Medical History: Past Medical History  Diagnosis Date  . Coronary artery disease   . Diabetes mellitus without complication ([place])   . Hypertension   . Chronic systolic CHF (congestive heart failure) (HCC)     a. prior EF normal; b. Echo 10/16: Mild LVH, EF 30-35%, mitral valve bioprosthesis present without evidence of stenosis or regurgitation, severe LAE, mild RVE with mild to moderately reduced RVSF, moderate RAE  . 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 H/O tricuspid valve repair 2012  . Paroxysmal atrial fibrillation (HCC)     s/p Maze procedure at time of MVR in 2011  . Diabetes mellitus type 2 in obese ([place])   . Obstructive sleep apnea   . Chronic kidney disease (CKD), stage IV (severe) ([place])   . Anemia of chronic disease   . Pulmonary HTN ([place])   . History of echocardiogram     Echo at [facility] in [facility] 4/12: mod LVH, EF 60-65%, mod LAE, tissue MVR ok, mod TR, mod pulmo HTN with RVSP 48 mmHg  . Asthma   . GERD (gastroesophageal reflux disease)     Assessment: 55 yo M presents on 4/4 with weakness and Afib with RVR. On Coumadin 5mg  daily exc 7.5mg  on Mon and Thurs. Last dose was 4/3. INR on admit is low at 1.44. Hgb 10.1, plts wnl.    Goal of Therapy:  Heparin level 0.3-0.7 units/ml Monitor platelets by anticoagulation protocol: Yes   Plan:  Give coumadin 7.5mg  PO x 1 tonight Monitor daily INR, CBC, s/s of bleed  Elenor Quinones, PharmD, BCPS Clinical Pharmacist Pager [phone] 02/19/2016 8:11 PM

## 2016-02-19 NOTE — ED Notes (Signed)
Pt returned from X-ray.  

## 2016-02-19 NOTE — H&P (Signed)
[facility] History and Physical  Michael [name] [id number] DOB: Dec 01, 1960 DOA: 02/19/2016  Referring physician: Sherwood Gambler, MD PCP: [name], MD   Chief Complaint: Atrial fibrillation.  HPI: Michael [name] is a 55 y.o. male with a past medical history of chronic atrial fibrillation on warfarin, CAD, status post mitral valve replacement, chronic systolic CHF, hypertension, chronic kidney disease, type 2 diabetes, anemia chronic disease, asthma, GERD who was referred from the Coumadin clinic due to atrial fibrillation with RVR earlier today.  Per patient, he has been feeling increasingly short of breath, with occasional palpitations and weak. He states that about a week ago he had pitting lower extremity edema which has since then resolve. He denies chest pain, dizziness, diaphoresis orthopnea, PND. He has not been very compliant with his digoxin and beta blocker.  When seen in the stepdown unit, the patient was in no acute distress. He denied chest pain and stated that his dyspnea was much better. Workup was significant for elevated BMP level, decreased digoxin level, anemia and worsened renal function.   Review of Systems:  Constitutional:  Positive fatigue. No weight loss, night sweats, Fever, chills. HEENT:  No headaches, Difficulty swallowing,Tooth/dental problems,Sore throat,  No sneezing, itching, ear ache, nasal congestion, post nasal drip,  Cardio-vascular:  As above mentioned. GI:  No heartburn, indigestion, abdominal pain, nausea, vomiting, diarrhea, change in bowel habits, loss of appetite  Resp:  No shortness of breath with exertion or at rest. No excess mucus, no productive cough, No non-productive cough, No coughing up of blood.No change in color of mucus.No wheezing.No chest wall deformity  Skin:  no rash or lesions.  GU:  no dysuria, change in color of urine, no urgency or frequency. No flank pain.  Musculoskeletal:  No joint pain or  swelling. No decreased range of motion. No back pain.  Psych:  No change in mood or affect. No depression or anxiety. No memory loss.   Past Medical History  Diagnosis Date  . Coronary artery disease   . Diabetes mellitus without complication ([place])   . Hypertension   . Chronic systolic CHF (congestive heart failure) (HCC)     a. prior EF normal; b. Echo 10/16: Mild LVH, EF 30-35%, mitral valve bioprosthesis present without evidence of stenosis or regurgitation, severe LAE, mild RVE with mild to moderately reduced RVSF, moderate RAE  . 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 H/O tricuspid valve repair 2012  . Paroxysmal atrial fibrillation (HCC)     s/p Maze procedure at time of MVR in 2011  . Diabetes mellitus type 2 in obese ([place])   . Obstructive sleep apnea   . Chronic kidney disease (CKD), stage IV (severe) ([place])   . Anemia of chronic disease   . Pulmonary HTN ([place])   . History of echocardiogram     Echo at [facility] 4/12: mod LVH, EF 60-65%, mod LAE, tissue MVR ok, mod TR, mod pulmo HTN with RVSP 48 mmHg  . Asthma   . GERD (gastroesophageal reflux disease)    Past Surgical History  Procedure Laterality Date  . Cardiac surgery     Social History:  reports that he has quit smoking. He does not have any smokeless tobacco history on file. He reports that he does not drink alcohol or use illicit drugs.  No Known Allergies  Family History  Problem Relation Age of Onset  . Heart attack Neg Hx     Prior to Admission medications  Medication Sig Start Date End Date Taking? Authorizing Provider  acetaminophen (TYLENOL) 325 MG tablet Take 650 mg by mouth every 6 (six) hours as needed for headache (pain). Reported on 01/10/2016   Yes Historical Provider, MD  allopurinol (ZYLOPRIM) 100 MG tablet Take 1 tablet (100 mg total) by mouth 2 (two) times daily. 01/10/16  Yes [name], MD  colchicine 0.6 MG tablet Take 2 tablets (1.2 mg) at the onset of a gout flare,  may repeat 1 tablet (0.6 mg) in one hour if symptoms persist. 01/24/16  Yes [name], MD  digoxin (LANOXIN) 0.125 MG tablet Take 1 tablet (0.125 mg total) by mouth daily. 10/17/15  Yes Einar Pheasant Hager, PA-C  diphenhydramine-acetaminophen (TYLENOL PM) 25-500 MG TABS tablet Take 1 tablet by mouth daily as needed. For sleep   Yes Historical Provider, MD  docusate sodium (COLACE) 100 MG capsule Take 1 capsule (100 mg total) by mouth 2 (two) times daily. For constipation 01/04/16  Yes Ripudeep Krystal Eaton, MD  furosemide (LASIX) 80 MG tablet Take 1 tablet (80 mg total) by mouth 2 (two) times daily. 01/04/16  Yes Ripudeep Krystal Eaton, MD  insulin glargine (LANTUS) 100 UNIT/ML injection Inject 0.2 mLs (20 Units total) into the skin at bedtime. 01/10/16  Yes [name], MD  isosorbide mononitrate (IMDUR) 30 MG 24 hr tablet Take 0.5 tablets (15 mg total) by mouth daily. Patient taking differently: Take 15 mg by mouth daily with supper.  10/24/15  Yes Scott T Kathlen Mody, PA-C  lidocaine (XYLOCAINE) 2 % solution Use as directed 10 mLs in the mouth or throat every 6 (six) hours as needed for mouth pain. 02/12/16  Yes [name], MD  metoprolol (LOPRESSOR) 100 MG tablet Take 100 mg by mouth 2 (two) times daily.   Yes Historical Provider, MD  potassium chloride SA (K-DUR,KLOR-CON) 20 MEQ tablet Take 1 tablet (20 mEq total) by mouth daily. 10/24/15  Yes Scott T Kathlen Mody, PA-C  traMADol (ULTRAM) 50 MG tablet Take 1 tablet (50 mg total) by mouth every 8 (eight) hours as needed. 01/10/16  Yes [name], MD  Vitamin D, Ergocalciferol, (DRISDOL) 50000 UNITS CAPS capsule Take 50,000 Units by mouth every 7 (seven) days. Reported on 01/10/2016. Take on Mondays   Yes Historical Provider, MD  warfarin (COUMADIN) 5 MG tablet Take 1-1.5 tablets (5-7.5 mg total) by mouth at bedtime. Take 1 1/2 tablets (7.5 mg) by mouth on Monday and Thursday, take 1 tablet (5 mg) on Sunday, Tuesday, Wednesday, Friday, Saturday 01/04/16  Yes Ripudeep Krystal Eaton, MD  Blood  Glucose Monitoring Suppl (ACCU-CHEK AVIVA) device Use as Instructed 3 times daily 01/24/16   [name], MD  glucose blood (ACCU-CHEK AVIVA) test strip Use 3 times daily as directed. 02/04/16   [name], MD  Lancet Devices [facility]) lancets Use as instructed 3 times daily 01/24/16   [name], MD   Physical Exam: Filed Vitals:   02/19/16 1845 02/19/16 1900 02/19/16 1940 02/20/16 0010  BP: 129/91 106/92 107/71 122/81  Pulse: 74 72 72 70  Temp:   97.3 F (36.3 C) 97.4 F (36.3 C)  TempSrc:   Oral Oral  Resp: 11 13 27 14   Height:   5\' 2"  (1.575 m)   Weight:   90.81 kg (200 lb 3.2 oz)   SpO2: 100% 99% 100% 100%    Wt Readings from Last 3 Encounters:  02/19/16 90.81 kg (200 lb 3.2 oz)  02/12/16 91.989 kg (202 lb 12.8 oz)  01/25/16 90.774  kg (200 lb 1.9 oz)    General:  Appears calm and comfortable Eyes: PERRL, normal lids, irises & conjunctiva ENT: grossly normal hearing, lips & tongue Neck: no LAD, masses or thyromegaly Cardiovascular: Irregularly irregular. 3/6 Positive systolic murmur. No LE edema. Telemetry:Atrial fibrillation with a rate of 78 bpm Respiratory: CTA bilaterally, no w/r/r. Normal respiratory effort. Abdomen: soft, ntnd Skin: no rash or induration seen on limited exam Musculoskeletal: grossly normal tone BUE/BLE Psychiatric: grossly normal mood and affect, speech fluent and appropriate Neurologic: Awake, alert, oriented 4, grossly non-focal.          Labs on Admission:  Basic Metabolic Panel:  Recent Labs Lab 02/19/16 1528  NA 138  K 4.4  CL 101  CO2 23  GLUCOSE 150*  BUN 48*  CREATININE 2.33*  CALCIUM 8.8*   Liver Function Tests:  Recent Labs Lab 02/19/16 1528  AST 46*  ALT 22  ALKPHOS 168*  BILITOT 2.7*  PROT 7.3  ALBUMIN 3.2*   CBC:  Recent Labs Lab 02/19/16 1528  WBC 7.8  NEUTROABS 4.8  HGB 10.1*  HCT 32.0*  MCV 68.8*  PLT 327   Cardiac Enzymes:  Recent Labs Lab 02/19/16 1528 02/19/16 1802  02/19/16 2301  CKTOTAL  --  141  --   TROPONINI 0.03  --  0.03    BNP (last 3 results)  Recent Labs  10/09/15 1750 12/29/15 1436 02/19/16 1528  BNP 666.2* 1453.8* 533.9*     CBG:  Recent Labs Lab 02/19/16 2111  GLUCAP 143*    Radiological Exams on Admission: Dg Chest 2 View  02/19/2016  CLINICAL DATA:  Short of breath today EXAM: CHEST  2 VIEW COMPARISON:  12/31/2015 FINDINGS: Severe cardiomegaly is stable. Normal vascularity. No pneumothorax. No pleural effusion. IMPRESSION: Cardiomegaly without decompensation. Electronically Signed   By: Marybelle Killings M.D.   On: 02/19/2016 15:49   EKG: Independently reviewed Vent. rate 135 BPM PR interval * ms QRS duration 98 ms QT/QTc 358/537 ms P-R-T axes * -60 127. Atrial fibrillation with rapid ventricular response Incomplete right bundle branch block Left anterior fascicular block Nonspecific ST and T wave abnormality Abnormal ECG  Assessment/Plan Principal Problem:   Atrial fibrillation with RVR (HCC) Admit to a stepdown/i beta blocker withdrawal andnpatient. Continue supplemental oxygen. Continue cardiac monitoring. Continue Cardizem infusion. Single loading dose of digoxin IVP. Resume metoprolol and oral digoxin.  Active Problems:   CKD (chronic kidney disease), stage IV (HCC)   DM type 2 (diabetes mellitus, type 2) (HCC) Continue Lantus SQ. CBG monitoring a regular insulin sliding scale.    Essential hypertension Resume metoprolol. Blood pressure monitoring.    Gout Continue allopurinol. Colchicine as needed.    Asthma Bronchodilators as needed.    Code Status: Full code. DVT Prophylaxis: Patient is taking warfarin. Family Communication:  Disposition Plan: Admit to stepdown to continue Cardizem infusion and rate control measures.  Time spent: Over 70 minutes were used in the presence of this admission.  Reubin Milan, M.D. [facility] Pager [phone].

## 2016-02-19 NOTE — ED Notes (Signed)
Pt was at coumadin clinic today and told them he has been feeling increasingly SOB and weak. Pt in AFib HR 120-130. Pt has hx Afib. BP 128/64, 98% on room air. Pt alert/oriented.

## 2016-02-19 NOTE — ED Notes (Signed)
Called lab and added on CK

## 2016-02-19 NOTE — ED Notes (Signed)
RN used Optometrist to speak with pt and triage him

## 2016-02-19 NOTE — ED Provider Notes (Signed)
CSN: [id number]     Arrival date & time 02/19/16  1506 History   First MD Initiated Contact with Patient 02/19/16 1508     Chief Complaint  Patient presents with  . Atrial Fibrillation     (Consider location/radiation/quality/duration/timing/severity/associated sxs/prior Treatment) HPI  55 year old male presents with weakness and afib with RVR. History is taken through interpretor at bedside. Went to cardiology office today and sent here. Feels like prior afib with RVR episodes. Is having associated shortness of breath. No lower extremity edema. Feels diffusely weak/no energy but no focal symptoms. No fevers, cough, diarrhea or vomiting. No chest pain. Patient states he has taken all of his medicines and no missed doses or changes.  Past Medical History  Diagnosis Date  . Coronary artery disease   . Diabetes mellitus without complication ([place])   . Hypertension   . Chronic systolic CHF (congestive heart failure) (HCC)     a. prior EF normal; b. Echo 10/16: Mild LVH, EF 30-35%, mitral valve bioprosthesis present without evidence of stenosis or regurgitation, severe LAE, mild RVE with mild to moderately reduced RVSF, moderate RAE  . 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 H/O tricuspid valve repair 2012  . Paroxysmal atrial fibrillation (HCC)     s/p Maze procedure at time of MVR in 2011  . Diabetes mellitus type 2 in obese ([place])   . Obstructive sleep apnea   . Chronic kidney disease (CKD), stage IV (severe) ([place])   . Anemia of chronic disease   . Pulmonary HTN ([place])   . History of echocardiogram     Echo at [facility] in [facility] 4/12: mod LVH, EF 60-65%, mod LAE, tissue MVR ok, mod TR, mod pulmo HTN with RVSP 48 mmHg  . Asthma   . GERD (gastroesophageal reflux disease)    Past Surgical History  Procedure Laterality Date  . Cardiac surgery     Family History  Problem Relation Age of Onset  . Heart attack Neg Hx    Social History  Substance Use Topics  . Smoking  status: Former Research scientist (life sciences)  . Smokeless tobacco: None  . Alcohol Use: No    Review of Systems  Constitutional: Positive for fatigue. Negative for fever.  Respiratory: Positive for shortness of breath.   Cardiovascular: Negative for chest pain.  Gastrointestinal: Negative for vomiting, abdominal pain and diarrhea.  Neurological: Positive for weakness.  All other systems reviewed and are negative.     Allergies  Review of patient's allergies indicates no known allergies.  Home Medications   Prior to Admission medications   Medication Sig Start Date End Date Taking? Authorizing Provider  acetaminophen (TYLENOL) 325 MG tablet Take 650 mg by mouth every 6 (six) hours as needed for headache (pain). Reported on 01/10/2016    Historical Provider, MD  allopurinol (ZYLOPRIM) 100 MG tablet Take 1 tablet (100 mg total) by mouth 2 (two) times daily. 01/10/16   [name], MD  Blood Glucose Monitoring Suppl (ACCU-CHEK AVIVA) device Use as Instructed 3 times daily 01/24/16   [name], MD  colchicine 0.6 MG tablet Take 2 tablets (1.2 mg) at the onset of a gout flare, may repeat 1 tablet (0.6 mg) in one hour if symptoms persist. 01/24/16   [name], MD  digoxin (LANOXIN) 0.125 MG tablet Take 1 tablet (0.125 mg total) by mouth daily. 10/17/15   Brett Canales, PA-C  diphenhydramine-acetaminophen (TYLENOL PM) 25-500 MG TABS tablet Take 1 tablet by mouth at bedtime  as needed.    Historical Provider, MD  docusate sodium (COLACE) 100 MG capsule Take 1 capsule (100 mg total) by mouth 2 (two) times daily. For constipation 01/04/16   Ripudeep Krystal Eaton, MD  furosemide (LASIX) 80 MG tablet Take 1 tablet (80 mg total) by mouth 2 (two) times daily. 01/04/16   Ripudeep Krystal Eaton, MD  glucose blood (ACCU-CHEK AVIVA) test strip Use 3 times daily as directed. 02/04/16   [name], MD  insulin glargine (LANTUS) 100 UNIT/ML injection Inject 0.2 mLs (20 Units total) into the skin at bedtime. 01/10/16   [name], MD   isosorbide mononitrate (IMDUR) 30 MG 24 hr tablet Take 0.5 tablets (15 mg total) by mouth daily. Patient taking differently: Take 15 mg by mouth daily with supper.  10/24/15   Liliane Shi, PA-C  Lancet Devices [facility]) lancets Use as instructed 3 times daily 01/24/16   [name], MD  lidocaine (XYLOCAINE) 2 % solution Use as directed 10 mLs in the mouth or throat every 6 (six) hours as needed for mouth pain. 02/12/16   [name], MD  metoprolol (LOPRESSOR) 100 MG tablet Take 100 mg by mouth 2 (two) times daily.    Historical Provider, MD  potassium chloride SA (K-DUR,KLOR-CON) 20 MEQ tablet Take 1 tablet (20 mEq total) by mouth daily. 10/24/15   Liliane Shi, PA-C  traMADol (ULTRAM) 50 MG tablet Take 1 tablet (50 mg total) by mouth every 8 (eight) hours as needed. 01/10/16   [name], MD  Vitamin D, Ergocalciferol, (DRISDOL) 50000 UNITS CAPS capsule Take 50,000 Units by mouth every 7 (seven) days. Reported on 01/10/2016    Historical Provider, MD  warfarin (COUMADIN) 5 MG tablet Take 1-1.5 tablets (5-7.5 mg total) by mouth at bedtime. Take 1 1/2 tablets (7.5 mg) by mouth on Monday and Thursday, take 1 tablet (5 mg) on Sunday, Tuesday, Wednesday, Friday, Saturday 01/04/16   Ripudeep K Rai, MD   BP 127/93 mmHg  Pulse 108  Temp(Src) 97.7 F (36.5 C) (Oral)  Resp 16  SpO2 100% Physical Exam  Constitutional: He is oriented to person, place, and time. He appears well-developed and well-nourished. No distress.  HENT:  Head: Normocephalic and atraumatic.  Right Ear: External ear normal.  Left Ear: External ear normal.  Nose: Nose normal.  Eyes: Right eye exhibits no discharge. Left eye exhibits no discharge.  Neck: Neck supple.  Cardiovascular: Normal heart sounds and intact distal pulses.  An irregularly irregular rhythm present. Tachycardia present.   HR in 120s-130s  Pulmonary/Chest: Effort normal and breath sounds normal. He has no wheezes. He has no rales.  Abdominal:  Soft. There is no tenderness.  Musculoskeletal: He exhibits no edema.  Neurological: He is alert and oriented to person, place, and time.  Skin: Skin is warm and dry. He is not diaphoretic.  Nursing note and vitals reviewed.   ED Course  Procedures (including critical care time) Labs Review Labs Reviewed  CBC WITH DIFFERENTIAL/PLATELET - Abnormal; Notable for the following:    Hemoglobin 10.1 (*)    HCT 32.0 (*)    MCV 68.8 (*)    MCH 21.7 (*)    RDW 23.5 (*)    All other components within normal limits  PROTIME-INR - Abnormal; Notable for the following:    Prothrombin Time 17.6 (*)    All other components within normal limits  COMPREHENSIVE METABOLIC PANEL  TROPONIN I  BRAIN NATRIURETIC PEPTIDE  DIGOXIN LEVEL  URINALYSIS, ROUTINE W  REFLEX MICROSCOPIC (NOT AT [facility])    Imaging Review Dg Chest 2 View  02/19/2016  CLINICAL DATA:  Short of breath today EXAM: CHEST  2 VIEW COMPARISON:  12/31/2015 FINDINGS: Severe cardiomegaly is stable. Normal vascularity. No pneumothorax. No pleural effusion. IMPRESSION: Cardiomegaly without decompensation. Electronically Signed   By: Marybelle Killings M.D.   On: 02/19/2016 15:49   I have personally reviewed and evaluated these images and lab results as part of my medical decision-making.   EKG Interpretation   Date/Time:  Tuesday February 19 2016 15:08:32 EDT Ventricular Rate:  135 PR Interval:    QRS Duration: 98 QT Interval:  358 QTC Calculation: 537 R Axis:   -60 Text Interpretation:  Atrial fibrillation with rapid ventricular response  Incomplete right bundle branch block Left anterior fascicular block  Nonspecific ST and T wave abnormality Abnormal ECG similar to Dec 29 2015  Confirmed by Tashiya Souders  MD, Asuncion Shibata ([id number]) on 02/19/2016 3:32:05 PM      MDM   Final diagnoses:  Atrial fibrillation with RVR ([place])    Patient's lab work shows AKI which could be continuing to some of his weakness. Initially a Cardizem bolus gave him rate control but  then his heart rate did go back into RVR. He was then placed on Cardizem bolus and drip. Not much change symptomatically. He is on digoxin but his levels undetectable. He has hematuria noted on his urine but no RBCs. However his CKs unremarkable. Unclear etiology. He will need to be admitted to the hospitalist for further workup and care.    Sherwood Gambler, MD 02/19/16 [phone]

## 2016-02-19 NOTE — ED Notes (Signed)
Attempted Report x1.   

## 2016-02-20 DIAGNOSIS — I480 Paroxysmal atrial fibrillation: Secondary | ICD-10-CM | POA: Diagnosis not present

## 2016-02-20 LAB — CBC WITH DIFFERENTIAL/PLATELET
Basophils Absolute: 0.1 10*3/uL (ref 0.0–0.1)
Basophils Relative: 1 %
EOS ABS: 0.2 10*3/uL (ref 0.0–0.7)
EOS PCT: 3 %
HCT: 33.2 % — ABNORMAL LOW (ref 39.0–52.0)
Hemoglobin: 10.3 g/dL — ABNORMAL LOW (ref 13.0–17.0)
LYMPHS PCT: 23 %
Lymphs Abs: 1.4 10*3/uL (ref 0.7–4.0)
MCH: 21.6 pg — ABNORMAL LOW (ref 26.0–34.0)
MCHC: 31 g/dL (ref 30.0–36.0)
MCV: 69.7 fL — ABNORMAL LOW (ref 78.0–100.0)
MONO ABS: 0.8 10*3/uL (ref 0.1–1.0)
Monocytes Relative: 14 %
NEUTROS PCT: 59 %
Neutro Abs: 3.5 10*3/uL (ref 1.7–7.7)
PLATELETS: 303 10*3/uL (ref 150–400)
RBC: 4.76 MIL/uL (ref 4.22–5.81)
RDW: 23.3 % — AB (ref 11.5–15.5)
WBC: 6 10*3/uL (ref 4.0–10.5)

## 2016-02-20 LAB — GLUCOSE, CAPILLARY
GLUCOSE-CAPILLARY: 108 mg/dL — AB (ref 65–99)
GLUCOSE-CAPILLARY: 138 mg/dL — AB (ref 65–99)
Glucose-Capillary: 126 mg/dL — ABNORMAL HIGH (ref 65–99)
Glucose-Capillary: 141 mg/dL — ABNORMAL HIGH (ref 65–99)

## 2016-02-20 LAB — COMPREHENSIVE METABOLIC PANEL
ALBUMIN: 2.8 g/dL — AB (ref 3.5–5.0)
ALT: 18 U/L (ref 17–63)
AST: 29 U/L (ref 15–41)
Alkaline Phosphatase: 141 U/L — ABNORMAL HIGH (ref 38–126)
Anion gap: 10 (ref 5–15)
BUN: 42 mg/dL — AB (ref 6–20)
CHLORIDE: 105 mmol/L (ref 101–111)
CO2: 24 mmol/L (ref 22–32)
Calcium: 8.8 mg/dL — ABNORMAL LOW (ref 8.9–10.3)
Creatinine, Ser: 1.97 mg/dL — ABNORMAL HIGH (ref 0.61–1.24)
GFR calc Af Amer: 42 mL/min — ABNORMAL LOW (ref >60)
GFR calc non Af Amer: 36 mL/min — ABNORMAL LOW (ref >60)
GLUCOSE: 125 mg/dL — AB (ref 65–99)
POTASSIUM: 3.9 mmol/L (ref 3.5–5.1)
Sodium: 139 mmol/L (ref 135–145)
Total Bilirubin: 2.8 mg/dL — ABNORMAL HIGH (ref 0.3–1.2)
Total Protein: 7.3 g/dL (ref 6.5–8.1)

## 2016-02-20 LAB — PROTIME-INR
INR: 1.43 (ref 0.00–1.49)
Prothrombin Time: 17.5 seconds — ABNORMAL HIGH (ref 11.6–15.2)

## 2016-02-20 LAB — TROPONIN I: Troponin I: 0.03 ng/mL (ref <0.031)

## 2016-02-20 MED ORDER — WARFARIN SODIUM 10 MG PO TABS
10.0000 mg | ORAL_TABLET | Freq: Once | ORAL | Status: AC
  Administered 2016-02-20: 10 mg via ORAL
  Filled 2016-02-20: qty 1

## 2016-02-20 MED ORDER — VITAMIN D (ERGOCALCIFEROL) 1.25 MG (50000 UNIT) PO CAPS: 50000.0000 [IU] | ORAL_CAPSULE | ORAL | Status: DC

## 2016-02-20 NOTE — Progress Notes (Signed)
Pt with 2.2 second pause. Lastest BP 129/88. Pt asymptomatic. NP on call notified. NP also notified that HR is 58-62 bpm. Will continue to monitor.

## 2016-02-20 NOTE — Discharge Instructions (Signed)
°Information on my medicine - Coumadin®   (Warfarin) ° °This medication education was reviewed with me or my healthcare representative as part of my discharge preparation.  The pharmacist that spoke with me during my hospital stay was:  Khamarion Bjelland Rhea, RPH ° °Why was Coumadin prescribed for you? °Coumadin was prescribed for you because you have a blood clot or a medical condition that can cause an increased risk of forming blood clots. Blood clots can cause serious health problems by blocking the flow of blood to the heart, lung, or brain. Coumadin can prevent harmful blood clots from forming. °As a reminder your indication for Coumadin is:   Stroke Prevention Because Of Atrial Fibrillation ° °What test will check on my response to Coumadin? °While on Coumadin (warfarin) you will need to have an INR test regularly to ensure that your dose is keeping you in the desired range. The INR (international normalized ratio) number is calculated from the result of the laboratory test called prothrombin time (PT). ° °If an INR APPOINTMENT HAS NOT ALREADY BEEN MADE FOR YOU please schedule an appointment to have this lab work done by your health care provider within 7 days. °Your INR goal is usually a number between:  2 to 3 or your provider may give you a more narrow range like 2-2.5.  Ask your health care provider during an office visit what your goal INR is. ° °What  do you need to  know  About  COUMADIN? °Take Coumadin (warfarin) exactly as prescribed by your healthcare provider about the same time each day.  DO NOT stop taking without talking to the doctor who prescribed the medication.  Stopping without other blood clot prevention medication to take the place of Coumadin may increase your risk of developing a new clot or stroke.  Get refills before you run out. ° °What do you do if you miss a dose? °If you miss a dose, take it as soon as you remember on the same day then continue your regularly scheduled regimen the  next day.  Do not take two doses of Coumadin at the same time. ° °Important Safety Information °A possible side effect of Coumadin (Warfarin) is an increased risk of bleeding. You should call your healthcare provider right away if you experience any of the following: °  Bleeding from an injury or your nose that does not stop. °  Unusual colored urine (red or dark brown) or unusual colored stools (red or black). °  Unusual bruising for unknown reasons. °  A serious fall or if you hit your head (even if there is no bleeding). ° °Some foods or medicines interact with Coumadin® (warfarin) and might alter your response to warfarin. To help avoid this: °  Eat a balanced diet, maintaining a consistent amount of Vitamin K. °  Notify your provider about major diet changes you plan to make. °  Avoid alcohol or limit your intake to 1 drink for women and 2 drinks for men per day. °(1 drink is 5 oz. wine, 12 oz. beer, or 1.5 oz. liquor.) ° °Make sure that ANY health care provider who prescribes medication for you knows that you are taking Coumadin (warfarin).  Also make sure the healthcare provider who is monitoring your Coumadin knows when you have started a new medication including herbals and non-prescription products. ° °Coumadin® (Warfarin)  Major Drug Interactions  °Increased Warfarin Effect Decreased Warfarin Effect  °Alcohol (large quantities) °Antibiotics (esp. Septra/Bactrim, Flagyl, Cipro) °Amiodarone (Cordarone) °Aspirin (  ASA) °Cimetidine (Tagamet) °Megestrol (Megace) °NSAIDs (ibuprofen, naproxen, etc.) °Piroxicam (Feldene) °Propafenone (Rythmol SR) °Propranolol (Inderal) °Isoniazid (INH) °Posaconazole (Noxafil) Barbiturates (Phenobarbital) °Carbamazepine (Tegretol) °Chlordiazepoxide (Librium) °Cholestyramine (Questran) °Griseofulvin °Oral Contraceptives °Rifampin °Sucralfate (Carafate) °Vitamin K  ° °Coumadin® (Warfarin) Major Herbal Interactions  °Increased Warfarin Effect Decreased Warfarin Effect   °Garlic °Ginseng °Ginkgo biloba Coenzyme Q10 °Green tea °St. John’s wort   ° °Coumadin® (Warfarin) FOOD Interactions  °Eat a consistent number of servings per week of foods HIGH in Vitamin K °(1 serving = ½ cup)  °Collards (cooked, or boiled & drained) °Kale (cooked, or boiled & drained) °Mustard greens (cooked, or boiled & drained) °Parsley *serving size only = ¼ cup °Spinach (cooked, or boiled & drained) °Swiss chard (cooked, or boiled & drained) °Turnip greens (cooked, or boiled & drained)  °Eat a consistent number of servings per week of foods MEDIUM-HIGH in Vitamin K °(1 serving = 1 cup)  °Asparagus (cooked, or boiled & drained) °Broccoli (cooked, boiled & drained, or raw & chopped) °Brussel sprouts (cooked, or boiled & drained) *serving size only = ½ cup °Lettuce, raw (green leaf, endive, romaine) °Spinach, raw °Turnip greens, raw & chopped  ° °These websites have more information on Coumadin (warfarin):  www.coumadin.com; °www.ahrq.gov/consumer/coumadin.htm; ° ° ° °

## 2016-02-20 NOTE — Progress Notes (Signed)
ANTICOAGULATION CONSULT NOTE  Pharmacy Consult for Coumadin Indication: atrial fibrillation  No Known Allergies  Patient Measurements: Height: 5\' 2"  (157.5 cm) Weight: 200 lb 3.2 oz (90.81 kg) IBW/kg (Calculated) : 54.6   Vital Signs: Temp: 97.4 F (36.3 C) (04/05 0729) Temp Source: Oral (04/05 0729) BP: 122/97 mmHg (04/05 0800) Pulse Rate: 67 (04/05 0800)  Labs:  Recent Labs  02/19/16 1354 02/19/16 1528 02/19/16 1802 02/19/16 2301 02/20/16 0405  HGB  --  10.1*  --   --  10.3*  HCT  --  32.0*  --   --  33.2*  PLT  --  327  --   --  PENDING  LABPROT  --  17.6*  --   --  17.5*  INR 1.4 1.44  --   --  1.43  CREATININE  --  2.33*  --   --  1.97*  CKTOTAL  --   --  141  --   --   TROPONINI  --  0.03  --  0.03 0.03    Estimated Creatinine Clearance: 40.9 mL/min (by C-G formula based on Cr of 1.97).   Assessment: 55 yo M presents on 4/4 with weakness and Afib with RVR. On Coumadin 5mg  daily exc 7.5mg  on Mon and Thurs. Last dose was 4/3. INR on admit was low at 1.44 last night, relatively unchanged this morning. Will give 10 tonight.  Goal of Therapy:  INR goal 2-3  Monitor platelets by anticoagulation protocol: Yes   Plan:  Give coumadin 10mg  PO x 1 tonight Monitor daily INR, CBC, s/s of bleed  Erin Hearing PharmD., BCPS Clinical Pharmacist Pager [phone] 02/20/2016 9:28 AM

## 2016-02-20 NOTE — Progress Notes (Signed)
[place] TEAM 1 - Stepdown/ICU TEAM PROGRESS NOTE  Michael [name] [id number] DOB: [birth year] DOA: 02/19/2016 PCP: [name], MD  Admit HPI / Brief Narrative: 55 y.o. male with a history of chronic atrial fibrillation on warfarin, CAD, status post mitral valve replacement, chronic systolic CHF, HTN, chronic kidney disease, DM2, anemia chronic disease, asthma, and GERD who was referred from the Coumadin clinic due to atrial fibrillation with RVR.  Per patient, he had been feeling increasingly short of breath, with occasional palpitations. He had not been compliant with his digoxin and beta blocker.  HPI/Subjective: The pt has no new complaints this morning.  He denies cp, n/v, sob, or abdom pain.    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 at [name] in [place] - followed by [place], w/ TTE 02/08/2016 - no evidence of acute issues   Chronic Systolic CHF EF [id number] Oct 2016 > 40-45% March 2017 - baseline wgt ~90kg - resume usual home diuretic regimen and follow I/Os [facility] Weights   02/19/16 1940  Weight: 90.81 kg (200 lb 3.2 oz)   DM2 CBG reasonably controlled at this time - follow - A1c 7.3 12/29/15  CAD Denies cp - followed as outpt by [place] hypertension  BP currently well controlled   Gout  Continue allopurinol + colchicine as needed  Asthma  Bronchodilators as needed - quiescent at present   Code Status:  FULL Family Communication: no family present at time of exam Disposition Plan: transfer to tele bed - ambulate - monitor rate/rythm - possible d/c home 4/6  Consultants: None  Procedures: None   Antibiotics: None  DVT prophylaxis: Warfarin   Objective: Blood pressure 122/97, pulse 67, temperature 97.4 F (36.3 C), temperature source Oral, resp. rate 21, height 5\' 2"  (1.575 m), weight 90.81 kg (200 lb 3.2 oz), SpO2 100 %.  Intake/Output Summary (Last 24 hours) at 02/20/16 1050 Last data filed at 02/20/16 0800  Gross per 24 hour  Intake 888.75 ml  Output    350 ml  Net 538.75 ml   Exam: General: No acute respiratory distress Lungs: Clear to auscultation bilaterally without wheezes or crackles Cardiovascular: Regular rate without murmur gallop or rub normal S1 and S2 Abdomen: Nontender, nondistended, soft, bowel sounds positive, no rebound, no ascites, no appreciable mass Extremities: No significant cyanosis, clubbing, or edema bilateral lower extremities  Data Reviewed:  Basic Metabolic Panel:  Recent Labs Lab 02/19/16 1528 02/20/16 0405  NA 138 139  K 4.4 3.9  CL 101 105  CO2 23 24  GLUCOSE 150* 125*  BUN 48* 42*  CREATININE 2.33* 1.97*  CALCIUM 8.8* 8.8*    CBC:  Recent Labs Lab 02/19/16 1528 02/20/16 0405  WBC 7.8 6.0  NEUTROABS 4.8 3.5  HGB 10.1* 10.3*  HCT 32.0* 33.2*  MCV 68.8* 69.7*  PLT 327 303    Liver Function Tests:  Recent Labs Lab 02/19/16 1528 02/20/16  0405  AST 46* 29  ALT 22 18  ALKPHOS 168* 141*  BILITOT 2.7* 2.8*  PROT 7.3 7.3  ALBUMIN 3.2* 2.8*   Coags:  Recent Labs Lab 02/19/16 1354 02/19/16 1528 02/20/16 0405  INR 1.4 1.44 1.43   Cardiac Enzymes:  Recent Labs Lab 02/19/16 1528 02/19/16 1802 02/19/16 2301 02/20/16 0405  CKTOTAL  --  141  --   --   TROPONINI 0.03  --  0.03 0.03    CBG:  Recent Labs Lab 02/19/16 2111 02/20/16 0732  GLUCAP 143* 126*    Recent Results (from the past 240 hour(s))   MRSA PCR Screening     Status: None   Collection Time: 02/19/16  5:39 PM  Result Value Ref Range Status   MRSA by PCR NEGATIVE NEGATIVE Final    Comment:        The GeneXpert MRSA Assay (FDA approved for NASAL specimens only), is one component of a comprehensive MRSA colonization surveillance program. It is not intended to diagnose MRSA infection nor to guide or monitor treatment for MRSA infections.      Studies:   Recent x-ray studies have been reviewed in detail by the Attending Physician  Scheduled Meds:  Scheduled Meds: . allopurinol  100 mg Oral BID  . digoxin  0.125 mg Oral Daily  . docusate sodium  100 mg Oral BID  . insulin aspart  0-15 Units Subcutaneous TID WC  . insulin glargine  20 Units Subcutaneous QHS  . isosorbide mononitrate  15 mg Oral Q supper  . metoprolol  100 mg Oral BID  . potassium chloride SA  20 mEq Oral Daily  . [START ON 02/25/2016] Vitamin D (Ergocalciferol)  50,000 Units Oral Q7 days  . warfarin  10 mg Oral ONCE-1800  . Warfarin - Pharmacist Dosing Inpatient   Does not apply q1800    Time spent on care of this patient: 35 mins   [facility] [place] T , MD   [facility] Office  [phone] Pager - Text Page per Amion as per below:  On-Call/Text Page:      [email or web address]      password [email or web address]  If 7PM-7AM, please contact night-coverage [email or web address] Password [email or web address] 02/20/2016, 10:50 AM   LOS: 1 day

## 2016-02-20 NOTE — Progress Notes (Signed)
Pt arrived from [place], placed on tele, ccmd notified, assesment completed see flowsheet, oriented to room and staff, call bed within reach  will continue to monitor

## 2016-02-20 NOTE — Hospital Discharge Follow-Up (Signed)
Met with the patient today. He has been followed at the [facility] ([facility]) and he wishes to continue to follow up at the [facility] after discharge. He has his prescriptions filled at the [facility] pharmacy and noted that he does not have any problem getting to the clinic as his son drives. He also reported that he lives with his son; but his son is not always at home. His son will do the grocery shopping.  The patient noted that he appreciates when the [name] interpreter is uses when he is contacted by telephone.   An appointment was scheduled at the [facility] for 02/28/16 @ 0930 and the information was placed on the AVS. Will continue to follow his hospital course.   Update provided to Babette Relic, RN CM

## 2016-02-21 DIAGNOSIS — I10 Essential (primary) hypertension: Secondary | ICD-10-CM

## 2016-02-21 DIAGNOSIS — I4891 Unspecified atrial fibrillation: Secondary | ICD-10-CM

## 2016-02-21 DIAGNOSIS — N184 Chronic kidney disease, stage 4 (severe): Secondary | ICD-10-CM

## 2016-02-21 LAB — COMPREHENSIVE METABOLIC PANEL
ALT: 16 U/L — ABNORMAL LOW (ref 17–63)
ANION GAP: 8 (ref 5–15)
AST: 25 U/L (ref 15–41)
Albumin: 2.6 g/dL — ABNORMAL LOW (ref 3.5–5.0)
Alkaline Phosphatase: 129 U/L — ABNORMAL HIGH (ref 38–126)
BUN: 28 mg/dL — ABNORMAL HIGH (ref 6–20)
CHLORIDE: 104 mmol/L (ref 101–111)
CO2: 23 mmol/L (ref 22–32)
Calcium: 8.4 mg/dL — ABNORMAL LOW (ref 8.9–10.3)
Creatinine, Ser: 1.57 mg/dL — ABNORMAL HIGH (ref 0.61–1.24)
GFR, EST AFRICAN AMERICAN: 55 mL/min — AB (ref >60)
GFR, EST NON AFRICAN AMERICAN: 48 mL/min — AB (ref >60)
Glucose, Bld: 130 mg/dL — ABNORMAL HIGH (ref 65–99)
POTASSIUM: 4.1 mmol/L (ref 3.5–5.1)
Sodium: 135 mmol/L (ref 135–145)
Total Bilirubin: 2.1 mg/dL — ABNORMAL HIGH (ref 0.3–1.2)
Total Protein: 6.5 g/dL (ref 6.5–8.1)

## 2016-02-21 LAB — CBC
HCT: 31.7 % — ABNORMAL LOW (ref 39.0–52.0)
Hemoglobin: 9.4 g/dL — ABNORMAL LOW (ref 13.0–17.0)
MCH: 20.3 pg — ABNORMAL LOW (ref 26.0–34.0)
MCHC: 29.7 g/dL — ABNORMAL LOW (ref 30.0–36.0)
MCV: 68.6 fL — AB (ref 78.0–100.0)
PLATELETS: 288 10*3/uL (ref 150–400)
RBC: 4.62 MIL/uL (ref 4.22–5.81)
RDW: 22.9 % — AB (ref 11.5–15.5)
WBC: 6.8 10*3/uL (ref 4.0–10.5)

## 2016-02-21 LAB — PROTIME-INR
INR: 1.53 — ABNORMAL HIGH (ref 0.00–1.49)
Prothrombin Time: 18.4 seconds — ABNORMAL HIGH (ref 11.6–15.2)

## 2016-02-21 LAB — GLUCOSE, CAPILLARY
GLUCOSE-CAPILLARY: 134 mg/dL — AB (ref 65–99)
GLUCOSE-CAPILLARY: 90 mg/dL (ref 65–99)

## 2016-02-21 LAB — PATHOLOGIST SMEAR REVIEW

## 2016-02-21 MED ORDER — WARFARIN SODIUM 5 MG PO TABS: 7.5000 mg | ORAL_TABLET | Freq: Every day | ORAL | Status: DC

## 2016-02-21 MED ORDER — WARFARIN SODIUM 7.5 MG PO TABS: 7.5000 mg | ORAL_TABLET | Freq: Once | ORAL | Status: DC

## 2016-02-21 NOTE — Discharge Summary (Signed)
Physician Discharge Summary  Michael [name] [id number] DOB: Mar 27, 1961 DOA: 02/19/2016  PCP: [name], MD  Admit date: 02/19/2016 Discharge date: 02/21/2016  Time spent: 45 minutes  Recommendations for Outpatient Follow-up:  1. INR check 4/10 at [place] [facility] 2. PCP at [facility] on 4/13   Discharge Diagnoses:  Principal Problem:   Atrial fibrillation with RVR ([place]) Active Problems:   CKD (chronic kidney disease), stage IV ([place])   DM type 2 (diabetes mellitus, type 2) ([place])   Essential hypertension   Gout   Asthma   Discharge Condition: stable  Diet recommendation: DM/heart healthy  Filed Weights   02/19/16 1940  Weight: 90.81 kg (200 lb 3.2 oz)    History of present illness:  Chief Complaint: Atrial fibrillation. HPI: Michael [name] is a 55 y.o. male with a past medical history of chronic atrial fibrillation on warfarin, CAD, status post mitral valve replacement, chronic systolic CHF, hypertension, chronic kidney disease, type 2 diabetes, anemia chronic disease, asthma, GERD who was referred from the Coumadin clinic due to atrial fibrillation with RVR .   Hospital Course:  55 y.o. male with a history of chronic atrial fibrillation on warfarin, CAD, status post mitral valve replacement, chronic systolic CHF, HTN, chronic kidney disease, DM2, anemia chronic disease, asthma, and GERD who was referred from the Coumadin clinic due to atrial fibrillation with RVR. Per patient, he had been feeling increasingly short of breath, with occasional palpitations. He had not been compliant with his digoxin and beta blocker.  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 at [facility] in [place] - followed by [place], w/ TTE 02/08/2016 - stable  Chronic Systolic CHF EF [id number] Oct 2016 > 40-45% March 2017 - baseline wgt ~90kg - resumed usual home diuretic of lasix and KCL  DM2 CBG reasonably controlled at this time - follow - A1c 7.3 12/29/15  CAD Denies cp - followed as outpt by [facility] Cards  -stable  Essential hypertension  BP currently well controlled   Gout  Continue allopurinol + colchicine as needed  Asthma  Bronchodilators as needed - quiescent at present        Discharge Exam: Filed Vitals:   02/21/16 1000 02/21/16 1310  BP:  115/81  Pulse: 80 102  Temp:  97.9 F (36.6 C)  Resp:  20    General: AAOx3 Cardiovascular: S1S2/Irregular rate and rhythm Respiratory: CTAB  Discharge Instructions   Discharge Instructions    Diet - low sodium heart healthy    Complete by:  As directed      Increase activity slowly    Complete by:  As directed           Current Discharge Medication List    CONTINUE these medications which have CHANGED   Details  warfarin (COUMADIN) 5 MG tablet Take 1.5 tablets (7.5 mg total) by mouth at bedtime. For  next 4 days then based on INR Monday Refills: 0      CONTINUE these medications which have NOT CHANGED   Details  acetaminophen (TYLENOL) 325 MG tablet Take 650 mg by mouth every 6 (six) hours as needed for headache (pain). Reported on 01/10/2016    allopurinol (ZYLOPRIM) 100 MG tablet Take 1 tablet (100 mg total) by mouth 2 (two) times daily. Qty: 60 tablet, Refills: 2   Associated Diagnoses: Gout without tophus, unspecified cause, unspecified chronicity, unspecified site     colchicine 0.6 MG tablet Take 2 tablets (1.2 mg) at the onset of a gout flare, may repeat 1 tablet (0.6 mg) in one hour if symptoms persist. Qty: 30 tablet, Refills: 2    digoxin (LANOXIN) 0.125 MG tablet Take 1 tablet (0.125 mg total) by mouth daily. Qty: 30 tablet, Refills: 11    diphenhydramine-acetaminophen (TYLENOL PM) 25-500 MG TABS tablet Take 1 tablet by mouth daily as needed. For sleep    docusate sodium (COLACE) 100 MG capsule Take 1 capsule (100 mg total) by mouth 2 (two) times daily. For constipation Qty: 60 capsule, Refills: 0    furosemide (LASIX) 80 MG tablet Take 1 tablet (80 mg total) by mouth 2 (two) times daily. Qty: 60 tablet, Refills: 3    insulin glargine (LANTUS) 100 UNIT/ML injection Inject 0.2 mLs (20 Units total) into the skin at bedtime. Qty: 10 mL, Refills: 2   Associated Diagnoses: Type 2 diabetes mellitus with complication, with long-term current use of insulin (HCC)    isosorbide mononitrate (IMDUR) 30 MG 24 hr tablet Take 0.5 tablets (15 mg total) by mouth daily. Qty: 30 tablet, Refills: 11   Associated Diagnoses: Essential hypertension    lidocaine (XYLOCAINE) 2 % solution Use as directed 10 mLs in the mouth or throat every 6 (six) hours as needed for mouth pain. Qty: 100 mL, Refills: 0   Associated Diagnoses: Aphthous ulcer of tongue    metoprolol (LOPRESSOR) 100 MG tablet Take 100 mg by mouth 2 (two) times daily.    potassium chloride SA (K-DUR,KLOR-CON) 20 MEQ tablet Take 1 tablet (20 mEq total) by mouth daily. Qty: 30 tablet, Refills: 11   Associated Diagnoses: Essential hypertension    traMADol (ULTRAM) 50 MG tablet Take 1 tablet (50 mg total) by mouth every 8 (eight) hours as needed. Qty: 30 tablet, Refills: 0   Associated Diagnoses: Pain in joint, ankle and foot, right    Vitamin D, Ergocalciferol, (DRISDOL) 50000 UNITS CAPS capsule Take 50,000 Units by mouth every 7 (seven) days. Reported on 01/10/2016. Take on Mondays    Blood Glucose  Monitoring Suppl (ACCU-CHEK AVIVA) device Use as Instructed 3 times daily Qty: 1 each, Refills: 0   Associated Diagnoses: Type 2 diabetes mellitus without complication, with long-term current use of insulin (HCC)    glucose blood (ACCU-CHEK AVIVA) test strip Use 3 times daily as directed. Qty: 100 each, Refills: 5   Associated Diagnoses: Type 2 diabetes mellitus without complication, with long-term current use of insulin (HCC)    Lancet Devices (ACCU-CHEK SOFTCLIX) lancets Use as instructed 3 times daily Qty: 1 each, Refills: 5   Associated Diagnoses: Type 2 diabetes mellitus without complication, with long-term current use of insulin (HCC)       No Known Allergies Follow-up Information    Follow up with [facility] on 02/28/2016.   Specialty:  Internal Medicine   Why:  at 9:30am for an appointment at the [facility]  with D Amao   Contact information:   [id number] [place] [phone]      Follow up with [facility] On 02/25/2016.   Why:  for INR check on Monday 4/10--- appointment for 2:45   Contact information:   [place] [phone]       The results of significant diagnostics from this hospitalization (including imaging, microbiology, ancillary and laboratory) are listed below for reference.    Significant Diagnostic Studies: Dg Chest 2 View  02/19/2016  CLINICAL DATA:  Short of breath today EXAM: CHEST  2 VIEW COMPARISON:  12/31/2015 FINDINGS: Severe cardiomegaly is stable. Normal vascularity. No pneumothorax. No pleural effusion. IMPRESSION: Cardiomegaly without decompensation. Electronically Signed   By: Marybelle Killings M.D.   On: 02/19/2016 15:49    Microbiology: Recent Results (from the past 240 hour(s))  MRSA PCR Screening     Status: None   Collection Time: 02/19/16  5:39  PM  Result Value Ref Range Status   MRSA by PCR NEGATIVE NEGATIVE Final    Comment:        The GeneXpert MRSA Assay (FDA approved for NASAL specimens only), is one component of a comprehensive MRSA colonization surveillance program. It is not intended to diagnose MRSA infection nor to guide or monitor treatment for MRSA infections.      Labs: Basic Metabolic Panel:  Recent Labs Lab 02/19/16 1528 02/20/16 0405 02/21/16 0221  NA 138 139 135  K 4.4 3.9 4.1  CL 101 105 104  CO2 23 24 23   GLUCOSE 150* 125* 130*  BUN 48* 42* 28*  CREATININE 2.33* 1.97* 1.57*  CALCIUM 8.8* 8.8* 8.4*   Liver Function Tests:  Recent Labs Lab 02/19/16 1528 02/20/16 0405 02/21/16 0221  AST 46* 29 25  ALT 22 18 16*  ALKPHOS 168* 141* 129*  BILITOT 2.7* 2.8* 2.1*  PROT 7.3 7.3 6.5  ALBUMIN 3.2* 2.8* 2.6*   No results for input(s): LIPASE, AMYLASE in the last 168 hours. No results for input(s): AMMONIA in the last 168 hours. CBC:  Recent Labs Lab 02/19/16 1528 02/20/16 0405 02/21/16 0221  WBC 7.8 6.0 6.8  NEUTROABS 4.8 3.5  --   HGB 10.1* 10.3* 9.4*  HCT 32.0* 33.2* 31.7*  MCV 68.8* 69.7* 68.6*  PLT 327 303 288   Cardiac Enzymes:  Recent Labs Lab 02/19/16 1528 02/19/16 1802 02/19/16 2301 02/20/16 0405  CKTOTAL  --  141  --   --   TROPONINI 0.03  --  0.03 0.03   BNP: BNP (last 3 results)  Recent Labs  10/09/15 1750 12/29/15 1436 02/19/16 1528  BNP 666.2* 1453.8* 533.9*    ProBNP (last 3 results) No results for input(s): PROBNP in the last 8760 hours.  CBG:  Recent Labs Lab 02/20/16 1216 02/20/16 1709 02/20/16 2124 02/21/16 0627 02/21/16 1116  GLUCAP 141* 108* 138* 134* 90       Signed:  Julena Barbour MD.  [facility] 02/21/2016, 1:26 PM

## 2016-02-21 NOTE — Hospital Discharge Follow-Up (Signed)
Transitional Care Clinic at [place]:  Patient known to the [facility] at [place].  Agreeable to Transitional Care Clinic follow-up and medical management once again after discharge. Appointment on 02/28/16 at 0930. This Case Manager met with patient at bedside to inform him he will receive post-discharge follow-up phone call after discharge. Patient verbalized understanding and indicated [phone] is the best number to reach him. He indicated he will have transportation to his upcoming appointment. Will continue to follow patient's medical progress closely.

## 2016-02-21 NOTE — Progress Notes (Signed)
Pt in stable condition, went over dc instructions with pt , pt verbalized understanding, pt belongings at bedside, iv taken out cardiac monitor dc,

## 2016-02-21 NOTE — Progress Notes (Signed)
ANTICOAGULATION CONSULT NOTE  Pharmacy Consult for Coumadin Indication: atrial fibrillation  No Known Allergies  Patient Measurements: Height: 5\' 2"  (157.5 cm) Weight: 200 lb 3.2 oz (90.81 kg) IBW/kg (Calculated) : 54.6   Vital Signs: Temp: 97.8 F (36.6 C) (04/06 0957) Temp Source: Oral (04/06 0957) BP: 117/79 mmHg (04/06 0957) Pulse Rate: 80 (04/06 1000)  Labs:  Recent Labs  02/19/16 1528 02/19/16 1802 02/19/16 2301 02/20/16 0405 02/21/16 0221  HGB 10.1*  --   --  10.3* 9.4*  HCT 32.0*  --   --  33.2* 31.7*  PLT 327  --   --  303 288  LABPROT 17.6*  --   --  17.5* 18.4*  INR 1.44  --   --  1.43 1.53*  CREATININE 2.33*  --   --  1.97* 1.57*  CKTOTAL  --  141  --   --   --   TROPONINI 0.03  --  0.03 0.03  --     Estimated Creatinine Clearance: 51.3 mL/min (by C-G formula based on Cr of 1.57).   Assessment: 55 yo M presents on 4/4 with weakness and Afib with RVR. On Coumadin 5mg  daily exc 7.5mg  on Mon and Thurs. INR on admit was low at 1.53  Goal of Therapy:  INR goal 2-3  Monitor platelets by anticoagulation protocol: Yes   Plan:  Give coumadin 7.5mg  PO x 1 tonight Monitor daily INR  Hildred Laser, Pharm D 02/21/2016 10:48 AM

## 2016-02-22 ENCOUNTER — Telehealth: Payer: Self-pay

## 2016-02-22 NOTE — Telephone Encounter (Signed)
Transitional Care Clinic Post-discharge Follow-Up Phone Call:  Date of Discharge: 02/21/16 Principal Discharge Diagnosis(es): Atrial fibrillation with RVR Call Completed: Yes                    With Whom: Patient Interpreter Needed: Yes              Language/Dialect: [name] Administrator, arts [phone])     Please check all that apply:  X  Patient is knowledgeable of his/her condition(s) and/or treatment. X  Patient is caring for self at home.  ? Patient is receiving assist at home from family and/or caregiver. Family and/or caregiver is knowledgeable of patient's condition(s) and/or treatment. ? Patient is receiving home health services. If so, name of agency.     Medication Reconciliation:  X  Patient obtained all discharge medications. Patient indicated he has all medications and did not need to review medication list. Informed patient he was to take 1.5 tablets (7.5 mg) by mouth at bedtime then get INR checked at [place] on 02/25/16 at 1445. Instructions repeated twice. Patient verbalized understanding. Instructed patient to bring all medications to his upcoming appointment with Dr. Jarold Song and to keep record of his blood glucose readings. Informed patient to bring blood glucose log to his upcoming appointment. Patient verbalized understanding.   Activities of Daily Living:  X  Independent ? Needs assist  ? Total Care    Community resources in place for patient:  X  None  ? Home Health/Home DME ? Assisted Living ? Support Group        Questions/Concerns discussed: This Case Manager placed call to patient to check on status. Patient denied any problems or concerns. Patient reminded of upcoming INR check on 02/25/16 at [facility] at 1445 as well as Transitional Care appointment on 02/28/16 at 0930 with Dr. Jarold Song. Patient verbalized understanding and indicated he had transportation to his appointments. No additional needs/concerns identified.

## 2016-02-25 ENCOUNTER — Emergency Department (HOSPITAL_COMMUNITY): Payer: Medicare Other

## 2016-02-25 ENCOUNTER — Inpatient Hospital Stay (HOSPITAL_COMMUNITY)
Admission: EM | Admit: 2016-02-25 | Discharge: 2016-02-27 | DRG: 392 | Disposition: A | Discharge status: Home / Self Care | Payer: Medicare Other | Attending: Internal Medicine | Admitting: Internal Medicine

## 2016-02-25 ENCOUNTER — Encounter (HOSPITAL_COMMUNITY): Payer: Self-pay | Admitting: Emergency Medicine

## 2016-02-25 ENCOUNTER — Observation Stay (HOSPITAL_COMMUNITY): Payer: Medicare Other

## 2016-02-25 DIAGNOSIS — I080 Rheumatic disorders of both mitral and aortic valves: Secondary | ICD-10-CM | POA: Diagnosis present

## 2016-02-25 DIAGNOSIS — E8809 Other disorders of plasma-protein metabolism, not elsewhere classified: Secondary | ICD-10-CM | POA: Diagnosis present

## 2016-02-25 DIAGNOSIS — Z79899 Other long term (current) drug therapy: Secondary | ICD-10-CM

## 2016-02-25 DIAGNOSIS — N184 Chronic kidney disease, stage 4 (severe): Secondary | ICD-10-CM | POA: Diagnosis not present

## 2016-02-25 DIAGNOSIS — R109 Unspecified abdominal pain: Secondary | ICD-10-CM | POA: Diagnosis not present

## 2016-02-25 DIAGNOSIS — I2510 Atherosclerotic heart disease of native coronary artery without angina pectoris: Secondary | ICD-10-CM | POA: Diagnosis present

## 2016-02-25 DIAGNOSIS — E118 Type 2 diabetes mellitus with unspecified complications: Secondary | ICD-10-CM

## 2016-02-25 DIAGNOSIS — I482 Chronic atrial fibrillation: Secondary | ICD-10-CM | POA: Diagnosis present

## 2016-02-25 DIAGNOSIS — K859 Acute pancreatitis without necrosis or infection, unspecified: Secondary | ICD-10-CM

## 2016-02-25 DIAGNOSIS — I272 Other secondary pulmonary hypertension: Secondary | ICD-10-CM | POA: Diagnosis present

## 2016-02-25 DIAGNOSIS — I4821 Permanent atrial fibrillation: Secondary | ICD-10-CM | POA: Diagnosis present

## 2016-02-25 DIAGNOSIS — I4891 Unspecified atrial fibrillation: Secondary | ICD-10-CM | POA: Diagnosis not present

## 2016-02-25 DIAGNOSIS — I509 Heart failure, unspecified: Secondary | ICD-10-CM | POA: Diagnosis present

## 2016-02-25 DIAGNOSIS — Z7901 Long term (current) use of anticoagulants: Secondary | ICD-10-CM

## 2016-02-25 DIAGNOSIS — I130 Hypertensive heart and chronic kidney disease with heart failure and stage 1 through stage 4 chronic kidney disease, or unspecified chronic kidney disease: Secondary | ICD-10-CM | POA: Diagnosis not present

## 2016-02-25 DIAGNOSIS — I5042 Chronic combined systolic (congestive) and diastolic (congestive) heart failure: Secondary | ICD-10-CM | POA: Diagnosis present

## 2016-02-25 DIAGNOSIS — I351 Nonrheumatic aortic (valve) insufficiency: Secondary | ICD-10-CM | POA: Diagnosis present

## 2016-02-25 DIAGNOSIS — A084 Viral intestinal infection, unspecified: Secondary | ICD-10-CM | POA: Diagnosis not present

## 2016-02-25 DIAGNOSIS — R1012 Left upper quadrant pain: Secondary | ICD-10-CM | POA: Diagnosis not present

## 2016-02-25 DIAGNOSIS — D638 Anemia in other chronic diseases classified elsewhere: Secondary | ICD-10-CM | POA: Diagnosis not present

## 2016-02-25 DIAGNOSIS — I059 Rheumatic mitral valve disease, unspecified: Secondary | ICD-10-CM | POA: Diagnosis present

## 2016-02-25 DIAGNOSIS — Z952 Presence of prosthetic heart valve: Secondary | ICD-10-CM

## 2016-02-25 DIAGNOSIS — I10 Essential (primary) hypertension: Secondary | ICD-10-CM | POA: Diagnosis present

## 2016-02-25 DIAGNOSIS — E1122 Type 2 diabetes mellitus with diabetic chronic kidney disease: Secondary | ICD-10-CM | POA: Diagnosis present

## 2016-02-25 DIAGNOSIS — K7460 Unspecified cirrhosis of liver: Secondary | ICD-10-CM | POA: Diagnosis present

## 2016-02-25 DIAGNOSIS — K219 Gastro-esophageal reflux disease without esophagitis: Secondary | ICD-10-CM | POA: Diagnosis present

## 2016-02-25 DIAGNOSIS — G4733 Obstructive sleep apnea (adult) (pediatric): Secondary | ICD-10-CM | POA: Diagnosis present

## 2016-02-25 DIAGNOSIS — R188 Other ascites: Secondary | ICD-10-CM | POA: Diagnosis present

## 2016-02-25 DIAGNOSIS — M109 Gout, unspecified: Secondary | ICD-10-CM | POA: Diagnosis present

## 2016-02-25 DIAGNOSIS — I959 Hypotension, unspecified: Secondary | ICD-10-CM | POA: Diagnosis not present

## 2016-02-25 DIAGNOSIS — Z794 Long term (current) use of insulin: Secondary | ICD-10-CM

## 2016-02-25 DIAGNOSIS — Z87891 Personal history of nicotine dependence: Secondary | ICD-10-CM

## 2016-02-25 HISTORY — DX: Heart failure, unspecified: I50.9

## 2016-02-25 HISTORY — DX: Permanent atrial fibrillation: I48.21

## 2016-02-25 HISTORY — DX: Acute pancreatitis without necrosis or infection, unspecified: K85.90

## 2016-02-25 HISTORY — DX: Unspecified abdominal pain: R10.9

## 2016-02-25 LAB — COMPREHENSIVE METABOLIC PANEL
ALBUMIN: 3.2 g/dL — AB (ref 3.5–5.0)
ALK PHOS: 120 U/L (ref 38–126)
ALT: 16 U/L — AB (ref 17–63)
AST: 32 U/L (ref 15–41)
Anion gap: 11 (ref 5–15)
BILIRUBIN TOTAL: 3.3 mg/dL — AB (ref 0.3–1.2)
BUN: 22 mg/dL — AB (ref 6–20)
CALCIUM: 9.1 mg/dL (ref 8.9–10.3)
CO2: 23 mmol/L (ref 22–32)
CREATININE: 1.95 mg/dL — AB (ref 0.61–1.24)
Chloride: 105 mmol/L (ref 101–111)
GFR calc Af Amer: 43 mL/min — ABNORMAL LOW (ref >60)
GFR calc non Af Amer: 37 mL/min — ABNORMAL LOW (ref >60)
GLUCOSE: 129 mg/dL — AB (ref 65–99)
Potassium: 5 mmol/L (ref 3.5–5.1)
Sodium: 139 mmol/L (ref 135–145)
TOTAL PROTEIN: 7.6 g/dL (ref 6.5–8.1)

## 2016-02-25 LAB — URINE MICROSCOPIC-ADD ON: Squamous Epithelial / LPF: NONE SEEN

## 2016-02-25 LAB — URINALYSIS, ROUTINE W REFLEX MICROSCOPIC
BILIRUBIN URINE: NEGATIVE
GLUCOSE, UA: NEGATIVE mg/dL
KETONES UR: NEGATIVE mg/dL
LEUKOCYTES UA: NEGATIVE
NITRITE: NEGATIVE
PH: 7.5 (ref 5.0–8.0)
PROTEIN: 100 mg/dL — AB
Specific Gravity, Urine: 1.009 (ref 1.005–1.030)

## 2016-02-25 LAB — GLUCOSE, CAPILLARY
Glucose-Capillary: 99 mg/dL (ref 65–99)
Glucose-Capillary: 86 mg/dL (ref 65–99)

## 2016-02-25 LAB — CBC
HCT: 32.9 % — ABNORMAL LOW (ref 39.0–52.0)
Hemoglobin: 9.9 g/dL — ABNORMAL LOW (ref 13.0–17.0)
MCH: 20.7 pg — AB (ref 26.0–34.0)
MCHC: 30.1 g/dL (ref 30.0–36.0)
MCV: 68.8 fL — ABNORMAL LOW (ref 78.0–100.0)
Platelets: 235 10*3/uL (ref 150–400)
RBC: 4.78 MIL/uL (ref 4.22–5.81)
RDW: 23.1 % — AB (ref 11.5–15.5)
WBC: 6 10*3/uL (ref 4.0–10.5)

## 2016-02-25 LAB — LACTIC ACID, PLASMA
LACTIC ACID, VENOUS: 2 mmol/L (ref 0.5–2.0)
LACTIC ACID, VENOUS: 1.6 mmol/L (ref 0.5–2.0)

## 2016-02-25 LAB — PHOSPHORUS: PHOSPHORUS: 3.9 mg/dL (ref 2.5–4.6)

## 2016-02-25 LAB — I-STAT TROPONIN, ED: Troponin i, poc: 0.03 ng/mL (ref 0.00–0.08)

## 2016-02-25 LAB — LIPASE, BLOOD: Lipase: 76 U/L — ABNORMAL HIGH (ref 11–51)

## 2016-02-25 LAB — PROTIME-INR
INR: 2.07 — ABNORMAL HIGH (ref 0.00–1.49)
PROTHROMBIN TIME: 23.1 s — AB (ref 11.6–15.2)

## 2016-02-25 LAB — DIGOXIN LEVEL: DIGOXIN LVL: 0.8 ng/mL (ref 0.8–2.0)

## 2016-02-25 LAB — MAGNESIUM: MAGNESIUM: 1.8 mg/dL (ref 1.7–2.4)

## 2016-02-25 MED ORDER — ENOXAPARIN SODIUM 40 MG/0.4ML ~~LOC~~ SOLN: 40.0000 mg | SUBCUTANEOUS | Status: DC

## 2016-02-25 MED ORDER — ALLOPURINOL 100 MG PO TABS: 100.0000 mg | ORAL_TABLET | Freq: Two times a day (BID) | ORAL | Status: DC

## 2016-02-25 MED ORDER — WARFARIN - PHARMACIST DOSING INPATIENT: Freq: Every day | Status: DC

## 2016-02-25 MED ORDER — IOPAMIDOL (ISOVUE-300) INJECTION 61%
INTRAVENOUS | Status: AC
  Filled 2016-02-25: qty 100

## 2016-02-25 MED ORDER — SODIUM CHLORIDE 0.9 % IV BOLUS (SEPSIS)
1000.0000 mL | Freq: Once | INTRAVENOUS | Status: AC
  Administered 2016-02-25: 1000 mL via INTRAVENOUS

## 2016-02-25 MED ORDER — ALLOPURINOL 100 MG PO TABS
100.0000 mg | ORAL_TABLET | Freq: Two times a day (BID) | ORAL | Status: DC
  Administered 2016-02-26 – 2016-02-27 (×3): 100 mg via ORAL
  Filled 2016-02-25 (×3): qty 1

## 2016-02-25 MED ORDER — ACETAMINOPHEN 650 MG RE SUPP: 650.0000 mg | Freq: Four times a day (QID) | RECTAL | Status: DC | PRN

## 2016-02-25 MED ORDER — MORPHINE SULFATE (PF) 2 MG/ML IV SOLN: 2.0000 mg | INTRAVENOUS | Status: DC | PRN

## 2016-02-25 MED ORDER — DILTIAZEM HCL 100 MG IV SOLR
5.0000 mg/h | Freq: Once | INTRAVENOUS | Status: AC
  Administered 2016-02-25: 5 mg/h via INTRAVENOUS
  Filled 2016-02-25: qty 100

## 2016-02-25 MED ORDER — POTASSIUM CHLORIDE CRYS ER 20 MEQ PO TBCR
20.0000 meq | EXTENDED_RELEASE_TABLET | Freq: Every day | ORAL | Status: DC
Start: 2016-02-25 — End: 2016-02-27
  Administered 2016-02-25 – 2016-02-26 (×2): 20 meq via ORAL
  Filled 2016-02-25 (×2): qty 1

## 2016-02-25 MED ORDER — SODIUM CHLORIDE 0.9 % IV SOLN
INTRAVENOUS | Status: DC
  Administered 2016-02-25: 15:00:00 via INTRAVENOUS

## 2016-02-25 MED ORDER — SODIUM CHLORIDE 0.9 % IV BOLUS (SEPSIS)
1000.0000 mL | Freq: Once | INTRAVENOUS | Status: AC
Start: 2016-02-25 — End: 2016-02-25
  Administered 2016-02-25: 1000 mL via INTRAVENOUS

## 2016-02-25 MED ORDER — FUROSEMIDE 80 MG PO TABS
80.0000 mg | ORAL_TABLET | Freq: Two times a day (BID) | ORAL | Status: DC
  Administered 2016-02-26 – 2016-02-27 (×4): 80 mg via ORAL
  Filled 2016-02-25 (×4): qty 1

## 2016-02-25 MED ORDER — ACETAMINOPHEN 325 MG PO TABS: 650.0000 mg | ORAL_TABLET | Freq: Four times a day (QID) | ORAL | Status: DC | PRN

## 2016-02-25 MED ORDER — TRAMADOL HCL 50 MG PO TABS: 50.0000 mg | ORAL_TABLET | Freq: Three times a day (TID) | ORAL | Status: DC | PRN

## 2016-02-25 MED ORDER — METOPROLOL TARTRATE 100 MG PO TABS
100.0000 mg | ORAL_TABLET | Freq: Two times a day (BID) | ORAL | Status: DC
  Administered 2016-02-25 – 2016-02-27 (×5): 100 mg via ORAL
  Filled 2016-02-25 (×3): qty 1
  Filled 2016-02-25: qty 4
  Filled 2016-02-25: qty 1

## 2016-02-25 MED ORDER — ONDANSETRON HCL 4 MG/2ML IJ SOLN
4.0000 mg | Freq: Once | INTRAMUSCULAR | Status: AC
  Administered 2016-02-25: 4 mg via INTRAVENOUS
  Filled 2016-02-25: qty 2

## 2016-02-25 MED ORDER — SODIUM CHLORIDE 0.9 % IV BOLUS (SEPSIS)
500.0000 mL | Freq: Once | INTRAVENOUS | Status: AC
  Administered 2016-02-25: 500 mL via INTRAVENOUS

## 2016-02-25 MED ORDER — ONDANSETRON HCL 4 MG PO TABS: 4.0000 mg | ORAL_TABLET | Freq: Four times a day (QID) | ORAL | Status: DC | PRN

## 2016-02-25 MED ORDER — INSULIN ASPART 100 UNIT/ML ~~LOC~~ SOLN: 0.0000 [IU] | Freq: Every day | SUBCUTANEOUS | Status: DC

## 2016-02-25 MED ORDER — INSULIN ASPART 100 UNIT/ML ~~LOC~~ SOLN
0.0000 [IU] | SUBCUTANEOUS | Status: DC
  Administered 2016-02-26: 2 [IU] via SUBCUTANEOUS

## 2016-02-25 MED ORDER — DEXTROSE-NACL 5-0.9 % IV SOLN
INTRAVENOUS | Status: DC
  Administered 2016-02-25 – 2016-02-26 (×2): via INTRAVENOUS

## 2016-02-25 MED ORDER — ONDANSETRON HCL 4 MG/2ML IJ SOLN: 4.0000 mg | Freq: Four times a day (QID) | INTRAMUSCULAR | Status: DC | PRN

## 2016-02-25 MED ORDER — INSULIN ASPART 100 UNIT/ML ~~LOC~~ SOLN: 0.0000 [IU] | Freq: Three times a day (TID) | SUBCUTANEOUS | Status: DC

## 2016-02-25 MED ORDER — SODIUM CHLORIDE 0.9% FLUSH
3.0000 mL | Freq: Two times a day (BID) | INTRAVENOUS | Status: DC
  Administered 2016-02-26 – 2016-02-27 (×3): 3 mL via INTRAVENOUS

## 2016-02-25 MED ORDER — WARFARIN SODIUM 5 MG PO TABS
5.0000 mg | ORAL_TABLET | Freq: Once | ORAL | Status: DC
  Filled 2016-02-25: qty 1

## 2016-02-25 MED ORDER — PIPERACILLIN-TAZOBACTAM 3.375 G IVPB
3.3750 g | Freq: Three times a day (TID) | INTRAVENOUS | Status: DC
  Administered 2016-02-25 – 2016-02-27 (×5): 3.375 g via INTRAVENOUS
  Filled 2016-02-25 (×7): qty 50

## 2016-02-25 MED ORDER — DEXTROSE 5 % IV SOLN
5.0000 mg/h | Freq: Once | INTRAVENOUS | Status: AC
  Administered 2016-02-25: 15 mg/h via INTRAVENOUS

## 2016-02-25 MED ORDER — DEXTROSE 50 % IV SOLN: 25.0000 mL | INTRAVENOUS | Status: DC | PRN

## 2016-02-25 MED ORDER — DIGOXIN 125 MCG PO TABS
0.1250 mg | ORAL_TABLET | Freq: Every day | ORAL | Status: DC
  Administered 2016-02-25 – 2016-02-27 (×3): 0.125 mg via ORAL
  Filled 2016-02-25 (×3): qty 1

## 2016-02-25 MED ORDER — ISOSORBIDE MONONITRATE 15 MG HALF TABLET
15.0000 mg | ORAL_TABLET | Freq: Every day | ORAL | Status: DC
  Administered 2016-02-26 – 2016-02-27 (×2): 15 mg via ORAL
  Filled 2016-02-25 (×3): qty 1

## 2016-02-25 MED ORDER — MORPHINE SULFATE (PF) 4 MG/ML IV SOLN
4.0000 mg | Freq: Once | INTRAVENOUS | Status: AC
  Administered 2016-02-25: 4 mg via INTRAVENOUS
  Filled 2016-02-25: qty 1

## 2016-02-25 NOTE — ED Provider Notes (Signed)
CSN: [id number]     Arrival date & time 02/25/16  [id number] History   First MD Initiated Contact with Patient 02/25/16 [phone]     Chief Complaint  Patient presents with  . Abdominal Pain     (Consider location/radiation/quality/duration/timing/severity/associated sxs/prior Treatment) HPI Patient presents with several days of abdominal distention and discomfort. Denies vomiting or diarrhea. No constipation or obstipation. Denies chest pain or shortness of breath. Has a history of atrial fibrillation and was recently hospitalized for RPR. Denies any urinary symptoms. Past Medical History  Diagnosis Date  . Coronary artery disease   . Diabetes mellitus without complication ([place])   . Hypertension   . Chronic systolic CHF (congestive heart failure) (HCC)     a. prior EF normal; b. Echo 10/16: Mild LVH, EF 30-35%, mitral valve bioprosthesis present without evidence of stenosis or regurgitation, severe LAE, mild RVE with mild to moderately reduced RVSF, moderate RAE  . 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 H/O tricuspid valve repair 2012  . Paroxysmal atrial fibrillation (HCC)     s/p Maze procedure at time of MVR in 2011  . Diabetes mellitus type 2 in obese ([place])   . Obstructive sleep apnea   . Chronic kidney disease (CKD), stage IV (severe) ([place])   . Anemia of chronic disease   . Pulmonary HTN ([place])   . History of echocardiogram     Echo at [facility] in [facility] 4/12: mod LVH, EF 60-65%, mod LAE, tissue MVR ok, mod TR, mod pulmo HTN with RVSP 48 mmHg  . Asthma   . GERD (gastroesophageal reflux disease)    Past Surgical History  Procedure Laterality Date  . Cardiac surgery     Family History  Problem Relation Age of Onset  . Heart attack Neg Hx    Social History  Substance Use Topics  . Smoking status: Former Research scientist (life sciences)  . Smokeless tobacco: None  . Alcohol Use: No    Review of Systems  Constitutional: Negative for fever and chills.  Respiratory: Negative for shortness  of breath.   Cardiovascular: Negative for chest pain and leg swelling.  Gastrointestinal: Positive for abdominal pain and abdominal distention. Negative for nausea, vomiting, diarrhea and constipation.  Genitourinary: Negative for dysuria, frequency and flank pain.  Musculoskeletal: Negative for back pain, neck pain and neck stiffness.  Skin: Negative for rash and wound.  Neurological: Negative for dizziness, weakness, light-headedness, numbness and headaches.  All other systems reviewed and are negative.     Allergies  Review of patient's allergies indicates no known allergies.  Home Medications   Prior to Admission medications   Medication Sig Start Date End Date Taking? Authorizing Provider  allopurinol (ZYLOPRIM) 100 MG tablet Take 1 tablet (100 mg total) by mouth 2 (two) times daily. 01/10/16  Yes [name], MD  acetaminophen (TYLENOL) 325 MG tablet Take 650 mg by mouth every 6 (six) hours as needed for headache (pain). Reported on 01/10/2016    Historical Provider, MD  Blood Glucose Monitoring Suppl (ACCU-CHEK AVIVA) device Use as Instructed 3 times daily 01/24/16   [name], MD  colchicine 0.6 MG tablet Take 2 tablets (1.2 mg) at the onset of a gout flare, may repeat 1 tablet (0.6 mg) in one hour if symptoms persist. 01/24/16   [name], MD  digoxin (LANOXIN) 0.125 MG tablet Take 1 tablet (0.125 mg total) by mouth daily. 10/17/15   Brett Canales, PA-C  diphenhydramine-acetaminophen (TYLENOL PM) 25-500 MG TABS tablet Take 1  tablet by mouth daily as needed. For sleep    Historical Provider, MD  docusate sodium (COLACE) 100 MG capsule Take 1 capsule (100 mg total) by mouth 2 (two) times daily. For constipation 01/04/16   Ripudeep Krystal Eaton, MD  furosemide (LASIX) 80 MG tablet Take 1 tablet (80 mg total) by mouth 2 (two) times daily. 01/04/16   Ripudeep Krystal Eaton, MD  glucose blood (ACCU-CHEK AVIVA) test strip Use 3 times daily as directed. 02/04/16   [name], MD  insulin glargine  (LANTUS) 100 UNIT/ML injection Inject 0.2 mLs (20 Units total) into the skin at bedtime. 01/10/16   [name], MD  isosorbide mononitrate (IMDUR) 30 MG 24 hr tablet Take 0.5 tablets (15 mg total) by mouth daily. Patient taking differently: Take 15 mg by mouth daily with supper.  10/24/15   Liliane Shi, PA-C  Lancet Devices [facility]) lancets Use as instructed 3 times daily 01/24/16   [name], MD  lidocaine (XYLOCAINE) 2 % solution Use as directed 10 mLs in the mouth or throat every 6 (six) hours as needed for mouth pain. 02/12/16   [name], MD  metoprolol (LOPRESSOR) 100 MG tablet Take 100 mg by mouth 2 (two) times daily.    Historical Provider, MD  potassium chloride SA (K-DUR,KLOR-CON) 20 MEQ tablet Take 1 tablet (20 mEq total) by mouth daily. 10/24/15   Liliane Shi, PA-C  traMADol (ULTRAM) 50 MG tablet Take 1 tablet (50 mg total) by mouth every 8 (eight) hours as needed. 01/10/16   [name], MD  Vitamin D, Ergocalciferol, (DRISDOL) 50000 UNITS CAPS capsule Take 50,000 Units by mouth every 7 (seven) days. Reported on 01/10/2016. Take on Mondays    Historical Provider, MD  warfarin (COUMADIN) 5 MG tablet Take 1.5 tablets (7.5 mg total) by mouth at bedtime. For next 4 days then based on INR Monday 02/21/16   Domenic Polite, MD   BP 134/101 mmHg  Pulse 83  Temp(Src) 98.1 F (36.7 C) (Oral)  Resp 16  SpO2 95% Physical Exam  Constitutional: He is oriented to person, place, and time. He appears well-developed and well-nourished. No distress.  HENT:  Head: Normocephalic and atraumatic.  Mouth/Throat: Oropharynx is clear and moist.  Eyes: EOM are normal. Pupils are equal, round, and reactive to light.  Neck: Normal range of motion. Neck supple.  Cardiovascular:  Tachycardia. Irregularly irregular rhythm  Pulmonary/Chest: Effort normal and breath sounds normal. No respiratory distress. He has no wheezes. He has no rales.  Abdominal: Soft. Bowel sounds are normal. He  exhibits distension. He exhibits no mass. There is tenderness (mild diffuse tenderness to palpation). There is no rebound and no guarding.  High-pitched bowel sounds throughout  Musculoskeletal: Normal range of motion. He exhibits no edema or tenderness.  No lower extremity edema or asymmetry.  Neurological: He is alert and oriented to person, place, and time.  Moves all extremities without deficit. Sensation is fully intact.  Skin: Skin is warm and dry. No rash noted. No erythema.  Psychiatric: He has a normal mood and affect. His behavior is normal.  Nursing note and vitals reviewed.   ED Course  Procedures (including critical care time) Labs Review Labs Reviewed  LIPASE, BLOOD - Abnormal; Notable for the following:    Lipase 76 (*)    All other components within normal limits  COMPREHENSIVE METABOLIC PANEL - Abnormal; Notable for the following:    Glucose, Bld 129 (*)    BUN 22 (*)  Creatinine, Ser 1.95 (*)    Albumin 3.2 (*)    ALT 16 (*)    Total Bilirubin 3.3 (*)    GFR calc non Af Amer 37 (*)    GFR calc Af Amer 43 (*)    All other components within normal limits  CBC - Abnormal; Notable for the following:    Hemoglobin 9.9 (*)    HCT 32.9 (*)    MCV 68.8 (*)    MCH 20.7 (*)    RDW 23.1 (*)    All other components within normal limits  URINALYSIS, ROUTINE W REFLEX MICROSCOPIC (NOT AT [facility]) - Abnormal; Notable for the following:    Hgb urine dipstick MODERATE (*)    Protein, ur 100 (*)    All other components within normal limits  PROTIME-INR - Abnormal; Notable for the following:    Prothrombin Time 23.1 (*)    INR 2.07 (*)    All other components within normal limits  URINE MICROSCOPIC-ADD ON - Abnormal; Notable for the following:    Bacteria, UA RARE (*)    All other components within normal limits  I-STAT TROPOININ, ED    Imaging Review Ct Abdomen Pelvis W Contrast  02/25/2016  CLINICAL DATA:  Abdominal pain EXAM: CT ABDOMEN AND PELVIS WITH CONTRAST  TECHNIQUE: Multidetector CT imaging of the abdomen and pelvis was performed using the standard protocol following bolus administration of intravenous contrast. CONTRAST:  80 mL Isovue-300 IV COMPARISON:  Renal ultrasound 09/11/2015 FINDINGS: Lower chest: . Small bilateral pleural effusions with mild compressive atelectasis right lower lobe. Cardiac enlargement. Left atrial enlargement. Prior mitral valve repair. Hepatobiliary: Liver is smaller than expected with irregular contour of the capsule suggestive of cirrhosis. No focal liver mass. Moderate ascites also consistent with cirrhosis. Gallbladder and bile ducts normal. Pancreas: Negative Spleen: Negative Adrenals/Urinary Tract: No renal mass or obstruction. There is asymmetric contrast excretion which is slightly delayed on the right. The etiology is not apparent. No ureteral stone or mass. Urinary bladder normal. Stomach/Bowel: Small bowel air-fluid levels in the left abdomen without significant dilatation of bowel. There is no colonic edema or dilatation. Appendix not well visualized but no evidence of acute appendicitis. Vascular/Lymphatic: Mild atherosclerotic calcification in the aorta and iliac arteries without aneurysm. No adenopathy. Reproductive: Mild prostate enlargement with prostate calcification. Other: Moderate ascites Musculoskeletal: No acute skeletal abnormality. IMPRESSION: Small bilateral effusions Moderate ascites with probable cirrhosis of the liver. Cardiac enlargement with left atrial enlargement. Prior mitral valve repair. Mild asymmetric delayed excretion of the right kidney. No stone or mass is seen. Cause of this discrepancy is not clear. Air-fluid levels in nondilated small bowel in the left abdomen. This may be due to gastroenteritis or ileus. Early small bowel obstruction not excluded. Electronically Signed   By: Franchot Gallo M.D.   On: 02/25/2016 12:05   I have personally reviewed and evaluated these images and lab results as  part of my medical decision-making.   EKG Interpretation   Date/Time:  Monday February 25 2016 09:51:46 EDT Ventricular Rate:  145 PR Interval:    QRS Duration: 90 QT Interval:  330 QTC Calculation: 512 R Axis:   -96 Text Interpretation:  Atrial fibrillation with rapid ventricular response  Right superior axis deviation Right ventricular hypertrophy Nonspecific ST  and T wave abnormality Abnormal ECG Confirmed by Lita Mains  MD, Shawnte Winton  ([id number]) on 02/25/2016 11:49:24 AM      MDM   Final diagnoses:  Left upper quadrant pain  Atrial fibrillation  with RVR ([place])     Patient states abdominal pains improved. On reexam there is no rebound or guarding. Patient does have more pronounced left-sided abdominal pain than on the right. CT with good levels in the left abdomen. Questionable gastroenteritis, ileus or early small bowel obstruction. Patient was started on Cardizem drip for atrial fibrillation with RVR.Marland Kitchen Vital signs otherwise remained stable. Discussed with Dr. Marily Memos who will admit patient to step down bed for observation.   Julianne Rice, MD 02/25/16 1314

## 2016-02-25 NOTE — Progress Notes (Signed)
RN had several questions. This NP touched base with admitting [facility], Dr. Marily Memos re: above.  IR and surgery are both being consulted but do not anticipate procedures tonight. Hold Coumadin pending am procedures. INR 2.06.  BP has been soft. Dilt drip stopped. 1L NS bolus has been given. For now, hold Lasix and watch for overload and BP to rise. Holding parameters written for Metoprolol.  Sugars changed to q4 with SSI. IVF changed to D5NS at 75/hr due to lowering sugars. RN to call if sugars still low or go over 200.  Continue to follow. Michael Boll, NP [facility]

## 2016-02-25 NOTE — Progress Notes (Addendum)
Pt seen for progressive hypotension. Dilt drip stopped some time ago. Pt w/ stable HR and asymptomatic.  Worsening Abd pain. Now w/ + Murphy sign.  US shows gallbladder wall thickening and wall edema w/o stones Will order additional 1L NS bolus, IR paracentecis, and zosyn (SBP and possible cholecystitis), lactic acid.  Gen surgery consult placed.   MELD score affected by coumadin but ~20-25  Linna Darner, MD [place] 02/25/2016, 6:15 PM

## 2016-02-25 NOTE — Progress Notes (Signed)
Pt NPO for abdominal pain and surgery consult, per Marily Memos MD. Pt is on coumadin. Night dose coumadin held per verbal NP order, as paracentesis is ordered and surgery consult ordered. PM dose of lasix held per order. Pt BP now 107/81, HR 64 (afib). PT HR gets as low as 47 per central tele. Patient now on [facility] and sats 98%. Will continue to monitor closely.

## 2016-02-25 NOTE — Progress Notes (Signed)
ANTICOAGULATION CONSULT NOTE - Initial Consult  Pharmacy Consult for warfarin Indication: atrial fibrillation  No Known Allergies  Patient Measurements:   Heparin Dosing Weight:   Vital Signs: Temp: 98.1 F (36.7 C) (04/10 0947) Temp Source: Oral (04/10 0947) BP: 127/98 mmHg (04/10 1400) Pulse Rate: 83 (04/10 0947)  Labs:  Recent Labs  02/25/16 1004  HGB 9.9*  HCT 32.9*  PLT 235  LABPROT 23.1*  INR 2.07*  CREATININE 1.95*    Estimated Creatinine Clearance: 41.3 mL/min (by C-G formula based on Cr of 1.95).   Medical History: Past Medical History  Diagnosis Date  . Coronary artery disease   . Diabetes mellitus without complication ([place])   . Hypertension   . Chronic systolic CHF (congestive heart failure) (HCC)     a. prior EF normal; b. Echo 10/16: Mild LVH, EF 30-35%, mitral valve bioprosthesis present without evidence of stenosis or regurgitation, severe LAE, mild RVE with mild to moderately reduced RVSF, moderate RAE  . 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 H/O tricuspid valve repair 2012  . Paroxysmal atrial fibrillation (HCC)     s/p Maze procedure at time of MVR in 2011  . Diabetes mellitus type 2 in obese ([place])   . Obstructive sleep apnea   . Chronic kidney disease (CKD), stage IV (severe) ([place])   . Anemia of chronic disease   . Pulmonary HTN ([place])   . History of echocardiogram     Echo at [facility] in [facility] 4/12: mod LVH, EF 60-65%, mod LAE, tissue MVR ok, mod TR, mod pulmo HTN with RVSP 48 mmHg  . Asthma   . GERD (gastroesophageal reflux disease)     Medications:  See EMR   Assessment: On warfarin PTA for hx of AFib. INR 2.  PTA warfarin dose: 5 mg qMon/Wed, 7.5 mg all other days   Goal of Therapy:  INR 2-3 Monitor platelets by anticoagulation protocol: Yes   Plan:  -Warfarin 5 mg po x1 -Daily INR   Faraz Ponciano, Jake [place] 02/25/2016,2:31 PM

## 2016-02-25 NOTE — H&P (Signed)
[facility] History and Physical  [name] BTY:[id number] DOB: 12-24-1960 DOA: 02/25/2016  Referring physician: Dr Lita Mains - [facility] PCP: [name], MD   Chief Complaint: Abd Pain  HPI: Michael [name] is a 55 y.o. male  Abdominal pain: present for approximately 2 days. Constant. Decreased oral intake. Worsening prior to presentation to the ED. Patient states that denies any vomiting or diarrhea. Endorses daily soft bowel movements. Associated with some nausea. Patient states she's missed some doses of his home medications due to his symptoms. Denies any chest pain, fevers, dysuria comfort, flank pain, rash.     Review of Systems:  Constitutional:  No weight loss, night sweats, Fevers, .  HEENT:  No headaches, Difficulty swallowing,Tooth/dental problems,Sore throat, Cardio-vascular:  No chest pain, Orthopnea, PND, swelling in lower extremities, anasarca, GI:  Per HPi Resp:   No shortness of breath with exertion or at rest. No excess mucus, no productive cough, No non-productive cough, No coughing up of blood.No change in color of mucus.No wheezing.No chest wall deformity  Skin:  no rash or lesions.  GU:  no dysuria, change in color of urine, no urgency or frequency. No flank pain.  Musculoskeletal:   No joint pain or swelling. No decreased range of motion. No back pain.  Psych:  No change in mood or affect. No depression or anxiety. No memory loss.  Neuro:  No change in sensation, unilateral strength, or cognitive abilities  All other systems were reviewed and are negative.  Past Medical History  Diagnosis Date  . Coronary artery disease   . Diabetes mellitus without complication ([place])   . Hypertension   . Chronic systolic CHF (congestive heart failure) (HCC)     a. prior EF normal; b. Echo 10/16: Mild LVH, EF 30-35%, mitral valve bioprosthesis present without evidence of stenosis or regurgitation, severe LAE, mild RVE with mild to moderately  reduced RVSF, moderate RAE  . 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 H/O tricuspid valve repair 2012  . Paroxysmal atrial fibrillation (HCC)     s/p Maze procedure at time of MVR in 2011  . Diabetes mellitus type 2 in obese ([place])   . Obstructive sleep apnea   . Chronic kidney disease (CKD), stage IV (severe) ([place])   . Anemia of chronic disease   . Pulmonary HTN ([place])   . History of echocardiogram     Echo at [facility] 4/12: mod LVH, EF 60-65%, mod LAE, tissue MVR ok, mod TR, mod pulmo HTN with RVSP 48 mmHg  . Asthma   . GERD (gastroesophageal reflux disease)    Past Surgical History  Procedure Laterality Date  . Cardiac surgery     Social History:  reports that he has quit smoking. He does not have any smokeless tobacco history on file. He reports that he does not drink alcohol or use illicit drugs.  No Known Allergies  Family History  Problem Relation Age of Onset  . Heart attack Neg Hx      Prior to Admission medications   Medication Sig Start Date End Date Taking? Authorizing Provider  allopurinol (ZYLOPRIM) 100 MG tablet Take 1 tablet (100 mg total) by mouth 2 (two) times daily. 01/10/16  Yes [name], MD  acetaminophen (TYLENOL) 325 MG tablet Take 650 mg by mouth every 6 (six) hours as needed for headache (pain). Reported on 01/10/2016    Historical Provider, MD  Blood Glucose Monitoring Suppl (ACCU-CHEK AVIVA) device Use as Instructed 3 times daily  01/24/16   [name], MD  colchicine 0.6 MG tablet Take 2 tablets (1.2 mg) at the onset of a gout flare, may repeat 1 tablet (0.6 mg) in one hour if symptoms persist. 01/24/16   [name], MD  digoxin (LANOXIN) 0.125 MG tablet Take 1 tablet (0.125 mg total) by mouth daily. 10/17/15   Brett Canales, PA-C  diphenhydramine-acetaminophen (TYLENOL PM) 25-500 MG TABS tablet Take 1 tablet by mouth daily as needed. For sleep    Historical Provider, MD  docusate sodium (COLACE) 100 MG capsule Take 1 capsule (100  mg total) by mouth 2 (two) times daily. For constipation 01/04/16   Ripudeep Krystal Eaton, MD  furosemide (LASIX) 80 MG tablet Take 1 tablet (80 mg total) by mouth 2 (two) times daily. 01/04/16   Ripudeep Krystal Eaton, MD  glucose blood (ACCU-CHEK AVIVA) test strip Use 3 times daily as directed. 02/04/16   [name], MD  insulin glargine (LANTUS) 100 UNIT/ML injection Inject 0.2 mLs (20 Units total) into the skin at bedtime. 01/10/16   [name], MD  isosorbide mononitrate (IMDUR) 30 MG 24 hr tablet Take 0.5 tablets (15 mg total) by mouth daily. Patient taking differently: Take 15 mg by mouth daily with supper.  10/24/15   Liliane Shi, PA-C  Lancet Devices [facility]) lancets Use as instructed 3 times daily 01/24/16   [name], MD  lidocaine (XYLOCAINE) 2 % solution Use as directed 10 mLs in the mouth or throat every 6 (six) hours as needed for mouth pain. 02/12/16   [name], MD  metoprolol (LOPRESSOR) 100 MG tablet Take 100 mg by mouth 2 (two) times daily.    Historical Provider, MD  potassium chloride SA (K-DUR,KLOR-CON) 20 MEQ tablet Take 1 tablet (20 mEq total) by mouth daily. 10/24/15   Liliane Shi, PA-C  traMADol (ULTRAM) 50 MG tablet Take 1 tablet (50 mg total) by mouth every 8 (eight) hours as needed. 01/10/16   [name], MD  Vitamin D, Ergocalciferol, (DRISDOL) 50000 UNITS CAPS capsule Take 50,000 Units by mouth every 7 (seven) days. Reported on 01/10/2016. Take on Mondays    Historical Provider, MD  warfarin (COUMADIN) 5 MG tablet Take 1.5 tablets (7.5 mg total) by mouth at bedtime. For next 4 days then based on INR Monday 02/21/16   Domenic Polite, MD   Physical Exam: Filed Vitals:   02/25/16 1300 02/25/16 1315 02/25/16 1345 02/25/16 1400  BP: 133/80 134/100 128/93 127/98  Pulse:      Temp:      TempSrc:      Resp: _0 SpO2: 98% 100%  97%    Wt Readings from Last 3 Encounters:  02/19/16 90.81 kg (200 lb 3.2 oz)  02/12/16 91.989 kg (202 lb 12.8 oz)  01/25/16  90.774 kg (200 lb 1.9 oz)    General:  Appears calm and comfortable Eyes:  PERRL, EOMI, normal lids, iris ENT:  grossly normal hearing, lips & tongue Neck:  no LAD, masses or thyromegaly Cardiovascular:  Irregularly irregular. tachycardic, no m/r/g. trace LE edema. Respiratory:  CTA bilaterally, no w/r/r. Normal respiratory effort. Abdomen: hypoactive BS, minimally tender to deep palpation soft, Skin:  no rash or induration seen on limited exam Musculoskeletal:  grossly normal tone BUE/BLE Psychiatric:  grossly normal mood and affect, speech fluent and appropriate Neurologic:  CN 2-12 grossly intact, moves all extremities in coordinated fashion.          Labs on Admission:  Basic  Metabolic Panel:  Recent Labs Lab 02/19/16 1528 02/20/16 0405 02/21/16 0221 02/25/16 1004  NA 138 139 135 139  K 4.4 3.9 4.1 5.0  CL 101 105 104 105  CO2 _0 GLUCOSE 150* 125* 130* 129*  BUN 48* 42* 28* 22*  CREATININE 2.33* 1.97* 1.57* 1.95*  CALCIUM 8.8* 8.8* 8.4* 9.1   Liver Function Tests:  Recent Labs Lab 02/19/16 1528 02/20/16 0405 02/21/16 0221 02/25/16 1004  AST 46* 29 25 32  ALT 22 18 16* 16*  ALKPHOS 168* 141* 129* 120  BILITOT 2.7* 2.8* 2.1* 3.3*  PROT 7.3 7.3 6.5 7.6  ALBUMIN 3.2* 2.8* 2.6* 3.2*    Recent Labs Lab 02/25/16 1004  LIPASE 76*   No results for input(s): AMMONIA in the last 168 hours. CBC:  Recent Labs Lab 02/19/16 1528 02/20/16 0405 02/21/16 0221 02/25/16 1004  WBC 7.8 6.0 6.8 6.0  NEUTROABS 4.8 3.5  --   --   HGB 10.1* 10.3* 9.4* 9.9*  HCT 32.0* 33.2* 31.7* 32.9*  MCV 68.8* 69.7* 68.6* 68.8*  PLT 327 303 288 235   Cardiac Enzymes:  Recent Labs Lab 02/19/16 1528 02/19/16 1802 02/19/16 2301 02/20/16 0405  CKTOTAL  --  141  --   --   TROPONINI 0.03  --  0.03 0.03    BNP (last 3 results)  Recent Labs  10/09/15 1750 12/29/15 1436 02/19/16 1528  BNP 666.2* 1453.8* 533.9*    ProBNP (last 3 results) No results for  input(s): PROBNP in the last 8760 hours.   CREATININE: 1.95 mg/dL ABNORMAL (02/25/16 1004) Estimated creatinine clearance - 41.3 mL/min  CBG:  Recent Labs Lab 02/20/16 1216 02/20/16 1709 02/20/16 2124 02/21/16 0627 02/21/16 1116  GLUCAP 141* 108* 138* 134* 90    Radiological Exams on Admission: Ct Abdomen Pelvis W Contrast  02/25/2016  CLINICAL DATA:  Abdominal pain EXAM: CT ABDOMEN AND PELVIS WITH CONTRAST TECHNIQUE: Multidetector CT imaging of the abdomen and pelvis was performed using the standard protocol following bolus administration of intravenous contrast. CONTRAST:  80 mL Isovue-300 IV COMPARISON:  Renal ultrasound 09/11/2015 FINDINGS: Lower chest: . Small bilateral pleural effusions with mild compressive atelectasis right lower lobe. Cardiac enlargement. Left atrial enlargement. Prior mitral valve repair. Hepatobiliary: Liver is smaller than expected with irregular contour of the capsule suggestive of cirrhosis. No focal liver mass. Moderate ascites also consistent with cirrhosis. Gallbladder and bile ducts normal. Pancreas: Negative Spleen: Negative Adrenals/Urinary Tract: No renal mass or obstruction. There is asymmetric contrast excretion which is slightly delayed on the right. The etiology is not apparent. No ureteral stone or mass. Urinary bladder normal. Stomach/Bowel: Small bowel air-fluid levels in the left abdomen without significant dilatation of bowel. There is no colonic edema or dilatation. Appendix not well visualized but no evidence of acute appendicitis. Vascular/Lymphatic: Mild atherosclerotic calcification in the aorta and iliac arteries without aneurysm. No adenopathy. Reproductive: Mild prostate enlargement with prostate calcification. Other: Moderate ascites Musculoskeletal: No acute skeletal abnormality. IMPRESSION: Small bilateral effusions Moderate ascites with probable cirrhosis of the liver. Cardiac enlargement with left atrial enlargement. Prior mitral valve  repair. Mild asymmetric delayed excretion of the right kidney. No stone or mass is seen. Cause of this discrepancy is not clear. Air-fluid levels in nondilated small bowel in the left abdomen. This may be due to gastroenteritis or ileus. Early small bowel obstruction not excluded. Electronically Signed   By: Franchot Gallo M.D.   On: 02/25/2016 12:05     Assessment/Plan  Active Problems:   CKD (chronic kidney disease), stage IV (HCC)   Essential hypertension   Mitral valve disease   Abdominal pain   Diabetes mellitus with complication (HCC)   Chronic atrial fibrillation with RVR (HCC)   Chronic combined systolic (congestive) and diastolic (congestive) heart failure (HCC)   Pancreatitis   Anemia, chronic disease   Abdominal pain: likley multifactorial w/ mild pancreatitis w/ viral gastro resulting in ileus vs early SBO. Concern for Cirrhosis and moderate ascites on CT which is a new finding for pt. No h/o ETOH or drug use. Alk phos nml. Lipase 76, ASt/ALT nml, Bili 3.3 - Stepdown (On Dilt drip for Afib/ RVR) - RUQ [place] - Hepatitis panel - NPO - IVF - NG tube if worsens.  - consider GI consult if worsens  Afib RVR: suspect in part from acute illness, dehydration, and not taking medications as prescribed. CHA2DS2-VASc = 4. INR 2.0 - Continue dilt drip - Resumem dig and metop - Dig level - Tele - cycle trop - Warfarin  CKD: Cr 1.9. At baseline - IVF - BMET in am  Diastolic/systolic CHF: Well compensated. Last Echo showing EF of 94% and diastolic dysfunction - Continue Lasix, metoprolol, digoxin, imdur  DM: - Resume lantus when taking PO - SSI  Anemia: Hgb 9.9. At baseline. Suspect from progr3essive renal disease.  Gout: - continue allopurinol  Code Status: FULL  DVT Prophylaxis: Warfarin Family Communication: none Disposition Plan: Pending Improvement    Tylene Quashie J, MD Family Medicine [facility] [email or web address] Password [email or web address]

## 2016-02-25 NOTE — ED Notes (Signed)
Pt sts here for abd pain and swelling x 3 days; pt denies vomiting

## 2016-02-25 NOTE — Progress Notes (Signed)
Pharmacy Antibiotic Note  Michael [name] is a 55 y.o. male admitted on 02/25/2016 with intra-abdominal infection.  Presented after abdominal pain x 2 days with some nausea.  Miltifactorial with mild pancreatitis.  PMH includes cirrhosis and moderate ascites.  Pharmacy has been consulted for zosyn dosing.  On Admit: Afebrile, HR 133, RR 18, WBC 6.0 Trop 0.03, Alb 3.2, SCr 1.95, CrCl 41.3, INR 2.07 (on warfarin)  Plan: Zosyn 3.375g IV q8h (4 hour infusion). Follow renal function, clinical course and cultures     Temp (24hrs), Avg:98.1 F (36.7 C), Min:98.1 F (36.7 C), Max:98.1 F (36.7 C)   Recent Labs Lab 02/19/16 1528 02/20/16 0405 02/21/16 0221 02/25/16 1004  WBC 7.8 6.0 6.8 6.0  CREATININE 2.33* 1.97* 1.57* 1.95*    Estimated Creatinine Clearance: 41.3 mL/min (by C-G formula based on Cr of 1.95).    No Known Allergies  Antimicrobials this admission: 4/10 Zosyn >>    Dose adjustments this admission:   Microbiology results:    Thank you for allowing pharmacy to be a part of this patient's care.  Viann Fish 02/25/2016 6:14 PM

## 2016-02-26 DIAGNOSIS — E1122 Type 2 diabetes mellitus with diabetic chronic kidney disease: Secondary | ICD-10-CM | POA: Diagnosis present

## 2016-02-26 DIAGNOSIS — Z79899 Other long term (current) drug therapy: Secondary | ICD-10-CM | POA: Diagnosis not present

## 2016-02-26 DIAGNOSIS — R188 Other ascites: Secondary | ICD-10-CM | POA: Diagnosis present

## 2016-02-26 DIAGNOSIS — I130 Hypertensive heart and chronic kidney disease with heart failure and stage 1 through stage 4 chronic kidney disease, or unspecified chronic kidney disease: Secondary | ICD-10-CM | POA: Diagnosis present

## 2016-02-26 DIAGNOSIS — E118 Type 2 diabetes mellitus with unspecified complications: Secondary | ICD-10-CM | POA: Diagnosis not present

## 2016-02-26 DIAGNOSIS — Z7901 Long term (current) use of anticoagulants: Secondary | ICD-10-CM | POA: Diagnosis not present

## 2016-02-26 DIAGNOSIS — M109 Gout, unspecified: Secondary | ICD-10-CM | POA: Diagnosis present

## 2016-02-26 DIAGNOSIS — G4733 Obstructive sleep apnea (adult) (pediatric): Secondary | ICD-10-CM | POA: Diagnosis present

## 2016-02-26 DIAGNOSIS — Z87891 Personal history of nicotine dependence: Secondary | ICD-10-CM | POA: Diagnosis not present

## 2016-02-26 DIAGNOSIS — K567 Ileus, unspecified: Secondary | ICD-10-CM

## 2016-02-26 DIAGNOSIS — D638 Anemia in other chronic diseases classified elsewhere: Secondary | ICD-10-CM | POA: Diagnosis present

## 2016-02-26 DIAGNOSIS — Z952 Presence of prosthetic heart valve: Secondary | ICD-10-CM | POA: Diagnosis not present

## 2016-02-26 DIAGNOSIS — I4891 Unspecified atrial fibrillation: Secondary | ICD-10-CM | POA: Insufficient documentation

## 2016-02-26 DIAGNOSIS — E8809 Other disorders of plasma-protein metabolism, not elsewhere classified: Secondary | ICD-10-CM | POA: Diagnosis present

## 2016-02-26 DIAGNOSIS — Z794 Long term (current) use of insulin: Secondary | ICD-10-CM | POA: Diagnosis not present

## 2016-02-26 DIAGNOSIS — N184 Chronic kidney disease, stage 4 (severe): Secondary | ICD-10-CM | POA: Diagnosis present

## 2016-02-26 DIAGNOSIS — I2510 Atherosclerotic heart disease of native coronary artery without angina pectoris: Secondary | ICD-10-CM | POA: Diagnosis present

## 2016-02-26 DIAGNOSIS — I272 Other secondary pulmonary hypertension: Secondary | ICD-10-CM | POA: Diagnosis present

## 2016-02-26 DIAGNOSIS — I080 Rheumatic disorders of both mitral and aortic valves: Secondary | ICD-10-CM | POA: Diagnosis present

## 2016-02-26 DIAGNOSIS — A084 Viral intestinal infection, unspecified: Secondary | ICD-10-CM | POA: Diagnosis present

## 2016-02-26 DIAGNOSIS — I482 Chronic atrial fibrillation: Secondary | ICD-10-CM | POA: Diagnosis present

## 2016-02-26 DIAGNOSIS — R109 Unspecified abdominal pain: Secondary | ICD-10-CM | POA: Diagnosis not present

## 2016-02-26 DIAGNOSIS — I959 Hypotension, unspecified: Secondary | ICD-10-CM | POA: Diagnosis not present

## 2016-02-26 DIAGNOSIS — I5042 Chronic combined systolic (congestive) and diastolic (congestive) heart failure: Secondary | ICD-10-CM | POA: Diagnosis present

## 2016-02-26 DIAGNOSIS — K7460 Unspecified cirrhosis of liver: Secondary | ICD-10-CM | POA: Diagnosis present

## 2016-02-26 DIAGNOSIS — K219 Gastro-esophageal reflux disease without esophagitis: Secondary | ICD-10-CM | POA: Diagnosis present

## 2016-02-26 LAB — COMPREHENSIVE METABOLIC PANEL
ALBUMIN: 2.9 g/dL — AB (ref 3.5–5.0)
ALT: 15 U/L — AB (ref 17–63)
AST: 31 U/L (ref 15–41)
Alkaline Phosphatase: 105 U/L (ref 38–126)
Anion gap: 11 (ref 5–15)
BUN: 24 mg/dL — AB (ref 6–20)
CHLORIDE: 107 mmol/L (ref 101–111)
CO2: 22 mmol/L (ref 22–32)
CREATININE: 2.05 mg/dL — AB (ref 0.61–1.24)
Calcium: 8.6 mg/dL — ABNORMAL LOW (ref 8.9–10.3)
GFR calc Af Amer: 40 mL/min — ABNORMAL LOW (ref >60)
GFR calc non Af Amer: 35 mL/min — ABNORMAL LOW (ref >60)
GLUCOSE: 102 mg/dL — AB (ref 65–99)
POTASSIUM: 4.8 mmol/L (ref 3.5–5.1)
SODIUM: 140 mmol/L (ref 135–145)
Total Bilirubin: 2.6 mg/dL — ABNORMAL HIGH (ref 0.3–1.2)
Total Protein: 6.8 g/dL (ref 6.5–8.1)

## 2016-02-26 LAB — CBC
HCT: 33.9 % — ABNORMAL LOW (ref 39.0–52.0)
Hemoglobin: 10 g/dL — ABNORMAL LOW (ref 13.0–17.0)
MCH: 20.5 pg — ABNORMAL LOW (ref 26.0–34.0)
MCHC: 29.5 g/dL — ABNORMAL LOW (ref 30.0–36.0)
MCV: 69.6 fL — AB (ref 78.0–100.0)
PLATELETS: 233 10*3/uL (ref 150–400)
RBC: 4.87 MIL/uL (ref 4.22–5.81)
RDW: 23.3 % — ABNORMAL HIGH (ref 11.5–15.5)
WBC: 5.4 10*3/uL (ref 4.0–10.5)

## 2016-02-26 LAB — PROTIME-INR
INR: 2.57 — AB (ref 0.00–1.49)
Prothrombin Time: 27.2 seconds — ABNORMAL HIGH (ref 11.6–15.2)

## 2016-02-26 LAB — GLUCOSE, CAPILLARY
GLUCOSE-CAPILLARY: 89 mg/dL (ref 65–99)
GLUCOSE-CAPILLARY: 92 mg/dL (ref 65–99)
GLUCOSE-CAPILLARY: 99 mg/dL (ref 65–99)
Glucose-Capillary: 102 mg/dL — ABNORMAL HIGH (ref 65–99)
Glucose-Capillary: 165 mg/dL — ABNORMAL HIGH (ref 65–99)
Glucose-Capillary: 99 mg/dL (ref 65–99)

## 2016-02-26 MED ORDER — WARFARIN - PHARMACIST DOSING INPATIENT: Freq: Every day | Status: DC

## 2016-02-26 MED ORDER — WARFARIN SODIUM 5 MG PO TABS
5.0000 mg | ORAL_TABLET | Freq: Once | ORAL | Status: AC
  Administered 2016-02-26: 5 mg via ORAL
  Filled 2016-02-26: qty 1

## 2016-02-26 MED ORDER — INSULIN GLARGINE 100 UNIT/ML ~~LOC~~ SOLN
10.0000 [IU] | Freq: Every day | SUBCUTANEOUS | Status: DC
  Administered 2016-02-26: 10 [IU] via SUBCUTANEOUS
  Filled 2016-02-26 (×2): qty 0.1

## 2016-02-26 NOTE — Clinical Documentation Improvement (Addendum)
Hospitalist  Please document query responses in the progress notes and discharge summary, not on the CDI BPA form.  Thank you.  To assist with accurate code assignment, please clarify and document if the diagnosis of "mild pancreatitis" has been:  - Ruled Out  - Confirmed  - Unable to clinically determine  Clinical Information: "Abdominal pain: likley multifactorial w/ mild pancreatitis w/ viral gastro resulting in ileus vs early SBO" documented in H&P  LFT trend this admission: Component     Latest Ref Rng 02/25/2016 02/26/2016  AST     15 - 41 U/L 32 31  ALT     17 - 63 U/L 16 (L) 15 (L)  Alkaline Phosphatase     38 - 126 U/L 120 105   Please exercise your independent, professional judgment when responding. A specific answer is not anticipated or expected.   Thank You, Erling Conte  RN BSN CCDS [phone] Health Information Management [place]

## 2016-02-26 NOTE — Hospital Discharge Follow-Up (Signed)
The patient has been followed at the [facility] at the [facility]. His appointment for 02/28/16 was rescheduled at the request of Jonnie Finner, RN CM. Met with the patient today and he would like to continue to follow up at [facility]. The new appointment is 03/04/16 @ 1500 and the information was placed on the AVS.    Will continue to follow his hospital course.

## 2016-02-26 NOTE — Care Management Obs Status (Signed)
[facility] NOTIFICATION   Patient Details  Name: Michael [name] MRN: [id number] Date of Birth: [birth year]   Medicare Observation Status Notification Given:  Yes    Erenest Rasher, RN 02/26/2016, 12:13 PM

## 2016-02-26 NOTE — Care Management Note (Signed)
Case Management Note  Patient Details  Name: Michael [name] MRN: [id number] Date of Birth: August 20, 1961  Subjective/Objective:      Abdominal pain, AFib               Action/Plan: Discharge Planning: NCM spoke to pt at bedside. Gave permission to speak to Gaston Islam [phone]. States he does not have any DME at home. He is ambulatory without equipment. He can afford his medications. Has appt at [facility] on 02/28/2016. Contacted [place] liaison to cancel appt. Will reschedule appt at dc. NCM will continue to follow for dc needs.   PCP- [name] MD  Expected Discharge Date:                  Expected Discharge Plan:  Home/Self Care  In-House Referral:  NA  Discharge planning Services  CM Consult  Post Acute Care Choice:  NA Choice offered to:  NA  DME Arranged:  N/A DME Agency:  NA  HH Arranged:  NA HH Agency:  NA  Status of Service:  In process, will continue to follow  Medicare Important Message Given:    Date Medicare IM Given:    Medicare IM give by:    Date Additional Medicare IM Given:    Additional Medicare Important Message give by:     If discussed at [place] of Stay Meetings, dates discussed:    Additional Comments:  Erenest Rasher, RN 02/26/2016, 12:37 PM

## 2016-02-26 NOTE — Progress Notes (Addendum)
ANTICOAGULATION CONSULT NOTE - Follow Up Consult  Pharmacy Consult for Coumadin Indication: atrial fibrillation  No Known Allergies  Patient Measurements: Height: 5\' 4"  (162.6 cm) Weight: 201 lb 1.6 oz (91.218 kg) IBW/kg (Calculated) : 59.2  Vital Signs: Temp: 98.6 F (37 C) (04/11 0753) Temp Source: Oral (04/11 0753) BP: 155/105 mmHg (04/11 0959) Pulse Rate: 71 (04/11 1001)  Labs:  Recent Labs  02/25/16 1004 02/26/16 0408 02/26/16 0842  HGB 9.9* 10.0*  --   HCT 32.9* 33.9*  --   PLT 235 233  --   LABPROT 23.1*  --  27.2*  INR 2.07*  --  2.57*  CREATININE 1.95* 2.05*  --     Estimated Creatinine Clearance: 41 mL/min (by C-G formula based on Cr of 2.05).   Medications:  Prescriptions prior to admission  Medication Sig Dispense Refill Last Dose  . allopurinol (ZYLOPRIM) 100 MG tablet Take 1 tablet (100 mg total) by mouth 2 (two) times daily. 60 tablet 2 02/24/2016 at Unknown time  . acetaminophen (TYLENOL) 325 MG tablet Take 650 mg by mouth every 6 (six) hours as needed for headache (pain). Reported on 01/10/2016   02/18/2016 at Unknown time  . Blood Glucose Monitoring Suppl (ACCU-CHEK AVIVA) device Use as Instructed 3 times daily 1 each 0 Taking  . colchicine 0.6 MG tablet Take 2 tablets (1.2 mg) at the onset of a gout flare, may repeat 1 tablet (0.6 mg) in one hour if symptoms persist. 30 tablet 2 02/18/2016 at Unknown time  . digoxin (LANOXIN) 0.125 MG tablet Take 1 tablet (0.125 mg total) by mouth daily. 30 tablet 11 02/18/2016 at Unknown time  . diphenhydramine-acetaminophen (TYLENOL PM) 25-500 MG TABS tablet Take 1 tablet by mouth daily as needed. For sleep   02/18/2016 at Unknown time  . docusate sodium (COLACE) 100 MG capsule Take 1 capsule (100 mg total) by mouth 2 (two) times daily. For constipation 60 capsule 0 02/18/2016 at Unknown time  . furosemide (LASIX) 80 MG tablet Take 1 tablet (80 mg total) by mouth 2 (two) times daily. 60 tablet 3 02/18/2016 at Unknown time  .  glucose blood (ACCU-CHEK AVIVA) test strip Use 3 times daily as directed. 100 each 5 Taking  . insulin glargine (LANTUS) 100 UNIT/ML injection Inject 0.2 mLs (20 Units total) into the skin at bedtime. 10 mL 2 02/18/2016 at Unknown time  . isosorbide mononitrate (IMDUR) 30 MG 24 hr tablet Take 0.5 tablets (15 mg total) by mouth daily. (Patient taking differently: Take 15 mg by mouth daily with supper. ) 30 tablet 11 02/18/2016 at Unknown time  . Lancet Devices (ACCU-CHEK SOFTCLIX) lancets Use as instructed 3 times daily 1 each 5 Taking  . lidocaine (XYLOCAINE) 2 % solution Use as directed 10 mLs in the mouth or throat every 6 (six) hours as needed for mouth pain. 100 mL 0 02/18/2016 at Unknown time  . metoprolol (LOPRESSOR) 100 MG tablet Take 100 mg by mouth 2 (two) times daily.   02/18/2016 at 0800  . potassium chloride SA (K-DUR,KLOR-CON) 20 MEQ tablet Take 1 tablet (20 mEq total) by mouth daily. 30 tablet 11 02/18/2016 at Unknown time  . traMADol (ULTRAM) 50 MG tablet Take 1 tablet (50 mg total) by mouth every 8 (eight) hours as needed. 30 tablet 0 02/18/2016 at Unknown time  . Vitamin D, Ergocalciferol, (DRISDOL) 50000 UNITS CAPS capsule Take 50,000 Units by mouth every 7 (seven) days. Reported on 01/10/2016. Take on Mondays   02/18/2016 at Unknown  time  . warfarin (COUMADIN) 5 MG tablet Take 1.5 tablets (7.5 mg total) by mouth at bedtime. For next 4 days then based on INR Monday  0     Assessment: 55 yo M on Coumadin PTA for hx afib.  Coumadin now on hold pending surgical eval for abd pain (r/o pancreatitis, cirrhosis, possible need for thoracentesis).  INR trending up.  Coumadin dose ordered for 4/10 was not given per NP orders.  Will discontinue protocol and follow-up for restart once intervention decisions made.  Goal of Therapy:  INR 2-3 Monitor platelets by anticoagulation protocol: Yes   Plan:  No Coumadin tonight Continue daily INR Discontinue Coumadin protocol - MD to reorder when safe to  restart Follow-up plans for procedures.  [employer], Pharm.D., BCPS Clinical Pharmacist Pager [phone] 02/26/2016 12:06 PM    Addendum: Paracentesis cancelled.  Will give Coumadin 5mg  PO x 1 tonight.  [employer], Pharm.D., BCPS Clinical Pharmacist Pager [phone] 02/26/2016 4:32 PM

## 2016-02-26 NOTE — Progress Notes (Signed)
PROGRESS NOTE  Michael [name] BRA:[id number] DOB: 1961/08/14 DOA: 02/25/2016 PCP: [name], MD  Assessment/Plan: Abdominal pain: -suspect viral gastro resulting in ileus vs early SBO.  -Concern for Cirrhosis on CT which is a new finding for pt. No h/o ETOH or drug use. Alk phos nml. Lipase 76, ASt/ALT nml, Bili 3.3 -on U/S only small ascites, will d/c paracentesis - Hepatitis panel pending - clears - NG tube if worsens.   Afib RVR: suspect in part from acute illness, dehydration, and not taking medications as prescribed. CHA2DS2-VASc = 4. INR 2.0 - Resume dig and metop - Tele - cycle trop - Warfarin  CKD: Cr 1.9. At baseline - daily CMPs  Diastolic/systolic CHF: Well compensated. Last Echo showing EF of 88% and diastolic dysfunction - Continue Lasix, metoprolol, digoxin, imdur  DM: - lantus 1/2 dose - SSI  Anemia: Hgb 9.9. At baseline. Suspect from progressive renal disease. -?schistocytes on smear during previous admission -check Fe panel in AM-- MCV 69  Gout: - continue allopurinol  Code Status: full Family Communication:  Disposition Plan:    Consultants:    Procedures:      HPI/Subjective: Wants to eat  Objective: Filed Vitals:   02/26/16 1001 02/26/16 1215  BP:  128/86  Pulse: 71 72  Temp:  97.7 F (36.5 C)  Resp:  20    Intake/Output Summary (Last 24 hours) at 02/26/16 1254 Last data filed at 02/26/16 1216  Gross per 24 hour  Intake      0 ml  Output    775 ml  Net   -775 ml   Filed Weights   02/25/16 1827 02/26/16 0500  Weight: 90.583 kg (199 lb 11.2 oz) 91.218 kg (201 lb 1.6 oz)    Exam:   General:  Awake, NAD  Cardiovascular: irr  Respiratory: clear  Abdomen: mild RUQ tenderness, diminished bowel sounds  Musculoskeletal: min edema   Data Reviewed: Basic Metabolic Panel:  Recent Labs Lab 02/19/16 1528 02/20/16 0405 02/21/16 0221 02/25/16 1004 02/25/16 1701 02/26/16 0408  NA 138 139 135 139  --  140    K 4.4 3.9 4.1 5.0  --  4.8  CL 101 105 104 105  --  107  CO2 '23 24 23 23  ' --  22  GLUCOSE 150* 125* 130* 129*  --  102*  BUN 48* 42* 28* 22*  --  24*  CREATININE 2.33* 1.97* 1.57* 1.95*  --  2.05*  CALCIUM 8.8* 8.8* 8.4* 9.1  --  8.6*  MG  --   --   --   --  1.8  --   PHOS  --   --   --   --  3.9  --    Liver Function Tests:  Recent Labs Lab 02/19/16 1528 02/20/16 0405 02/21/16 0221 02/25/16 1004 02/26/16 0408  AST 46* 29 25 32 31  ALT 22 18 16* 16* 15*  ALKPHOS 168* 141* 129* 120 105  BILITOT 2.7* 2.8* 2.1* 3.3* 2.6*  PROT 7.3 7.3 6.5 7.6 6.8  ALBUMIN 3.2* 2.8* 2.6* 3.2* 2.9*    Recent Labs Lab 02/25/16 1004  LIPASE 76*   No results for input(s): AMMONIA in the last 168 hours. CBC:  Recent Labs Lab 02/19/16 1528 02/20/16 0405 02/21/16 0221 02/25/16 1004 02/26/16 0408  WBC 7.8 6.0 6.8 6.0 5.4  NEUTROABS 4.8 3.5  --   --   --   HGB 10.1* 10.3* 9.4* 9.9* 10.0*  HCT 32.0* 33.2* 31.7* 32.9* 33.9*  MCV 68.8* 69.7* 68.6* 68.8* 69.6*  PLT 327 303 288 235 233   Cardiac Enzymes:  Recent Labs Lab 02/19/16 1528 02/19/16 1802 02/19/16 2301 02/20/16 0405  CKTOTAL  --  141  --   --   TROPONINI 0.03  --  0.03 0.03   BNP (last 3 results)  Recent Labs  10/09/15 1750 12/29/15 1436 02/19/16 1528  BNP 666.2* 1453.8* 533.9*    ProBNP (last 3 results) No results for input(s): PROBNP in the last 8760 hours.  CBG:  Recent Labs Lab 02/25/16 2004 02/25/16 2348 02/26/16 0508 02/26/16 0751 02/26/16 1213  GLUCAP 86 102* 89 92 99    Recent Results (from the past 240 hour(s))  MRSA PCR Screening     Status: None   Collection Time: 02/19/16  5:39 PM  Result Value Ref Range Status   MRSA by PCR NEGATIVE NEGATIVE Final    Comment:        The GeneXpert MRSA Assay (FDA approved for NASAL specimens only), is one component of a comprehensive MRSA colonization surveillance program. It is not intended to diagnose MRSA infection nor to guide or monitor  treatment for MRSA infections.      Studies: Ct Abdomen Pelvis W Contrast  02/25/2016  CLINICAL DATA:  Abdominal pain EXAM: CT ABDOMEN AND PELVIS WITH CONTRAST TECHNIQUE: Multidetector CT imaging of the abdomen and pelvis was performed using the standard protocol following bolus administration of intravenous contrast. CONTRAST:  80 mL Isovue-300 IV COMPARISON:  Renal ultrasound 09/11/2015 FINDINGS: Lower chest: . Small bilateral pleural effusions with mild compressive atelectasis right lower lobe. Cardiac enlargement. Left atrial enlargement. Prior mitral valve repair. Hepatobiliary: Liver is smaller than expected with irregular contour of the capsule suggestive of cirrhosis. No focal liver mass. Moderate ascites also consistent with cirrhosis. Gallbladder and bile ducts normal. Pancreas: Negative Spleen: Negative Adrenals/Urinary Tract: No renal mass or obstruction. There is asymmetric contrast excretion which is slightly delayed on the right. The etiology is not apparent. No ureteral stone or mass. Urinary bladder normal. Stomach/Bowel: Small bowel air-fluid levels in the left abdomen without significant dilatation of bowel. There is no colonic edema or dilatation. Appendix not well visualized but no evidence of acute appendicitis. Vascular/Lymphatic: Mild atherosclerotic calcification in the aorta and iliac arteries without aneurysm. No adenopathy. Reproductive: Mild prostate enlargement with prostate calcification. Other: Moderate ascites Musculoskeletal: No acute skeletal abnormality. IMPRESSION: Small bilateral effusions Moderate ascites with probable cirrhosis of the liver. Cardiac enlargement with left atrial enlargement. Prior mitral valve repair. Mild asymmetric delayed excretion of the right kidney. No stone or mass is seen. Cause of this discrepancy is not clear. Air-fluid levels in nondilated small bowel in the left abdomen. This may be due to gastroenteritis or ileus. Early small bowel  obstruction not excluded. Electronically Signed   By: Franchot Gallo M.D.   On: 02/25/2016 12:05   US Abdomen Limited Ruq  02/25/2016  CLINICAL DATA:  Cirrhosis of the liver. Pancreatitis. Ascites. Hypoalbuminemia. EXAM: US ABDOMEN LIMITED - RIGHT UPPER QUADRANT COMPARISON:  Abdomen CT obtained today. FINDINGS: Gallbladder: Mild diffuse wall thickening with minimal hypoechoic edema. The maximum wall thickness is 3.2 mm. No gallstones seen. The patient was not focally tender over the gallbladder. Common bile duct: Diameter: 4.1 mm proximally. Liver: Mildly lobulated contours. Normal echogenicity. 8 mm linear echogenic focus in the posterior aspect of the right lobe, with no corresponding abnormality on the recent CT. Other:  Small amount of free peritoneal fluid. IMPRESSION: 1. Mild diffuse gallbladder  wall thickening with minimal hypoechoic wall edema. This is most likely due to the patient's hypoalbuminemia. 2. Mildly lobulated liver contours, compatible with the history of cirrhosis. 3. Probable developing calcification in the posterior aspect of the right lobe of the liver. 4. Small amount of ascites. Electronically Signed   By: Claudie Revering M.D.   On: 02/25/2016 17:50    Scheduled Meds: . allopurinol  100 mg Oral BID  . digoxin  0.125 mg Oral Daily  . furosemide  80 mg Oral BID  . insulin aspart  0-9 Units Subcutaneous Q4H  . insulin glargine  10 Units Subcutaneous QHS  . isosorbide mononitrate  15 mg Oral Q supper  . metoprolol  100 mg Oral BID  . piperacillin-tazobactam (ZOSYN)  IV  3.375 g Intravenous Q8H  . potassium chloride SA  20 mEq Oral Daily  . sodium chloride flush  3 mL Intravenous Q12H   Continuous Infusions:   Antibiotics Given (last 72 hours)    Date/Time Action Medication Dose Rate   02/25/16 2130 Given   piperacillin-tazobactam (ZOSYN) IVPB 3.375 g 3.375 g 12.5 mL/hr   02/26/16 0530 Given   piperacillin-tazobactam (ZOSYN) IVPB 3.375 g 3.375 g 12.5 mL/hr   02/26/16 1234  Given   piperacillin-tazobactam (ZOSYN) IVPB 3.375 g 3.375 g 12.5 mL/hr      Active Problems:   CKD (chronic kidney disease), stage IV (HCC)   Essential hypertension   Mitral valve disease   Atrial fibrillation with RVR (HCC)   Abdominal pain   Diabetes mellitus with complication (HCC)   Chronic atrial fibrillation with RVR (HCC)   Chronic combined systolic (congestive) and diastolic (congestive) heart failure (HCC)   Pancreatitis   Anemia, chronic disease    Time spent: 25 min   [facility] Pager [phone]. If 7PM-7AM, please contact night-coverage at [email or web address], password [facility] 02/26/2016, 12:54 PM

## 2016-02-27 DIAGNOSIS — I10 Essential (primary) hypertension: Secondary | ICD-10-CM

## 2016-02-27 DIAGNOSIS — A084 Viral intestinal infection, unspecified: Secondary | ICD-10-CM

## 2016-02-27 DIAGNOSIS — I482 Chronic atrial fibrillation: Secondary | ICD-10-CM

## 2016-02-27 DIAGNOSIS — N184 Chronic kidney disease, stage 4 (severe): Secondary | ICD-10-CM

## 2016-02-27 DIAGNOSIS — I5042 Chronic combined systolic (congestive) and diastolic (congestive) heart failure: Secondary | ICD-10-CM

## 2016-02-27 DIAGNOSIS — E118 Type 2 diabetes mellitus with unspecified complications: Secondary | ICD-10-CM

## 2016-02-27 DIAGNOSIS — I351 Nonrheumatic aortic (valve) insufficiency: Secondary | ICD-10-CM | POA: Diagnosis present

## 2016-02-27 DIAGNOSIS — D638 Anemia in other chronic diseases classified elsewhere: Secondary | ICD-10-CM

## 2016-02-27 HISTORY — DX: Nonrheumatic aortic (valve) insufficiency: I35.1

## 2016-02-27 HISTORY — DX: Viral intestinal infection, unspecified: A08.4

## 2016-02-27 LAB — IRON AND TIBC
Iron: 28 ug/dL — ABNORMAL LOW (ref 45–182)
SATURATION RATIOS: 7 % — AB (ref 17.9–39.5)
TIBC: 427 ug/dL (ref 250–450)
UIBC: 399 ug/dL

## 2016-02-27 LAB — GLUCOSE, CAPILLARY
GLUCOSE-CAPILLARY: 86 mg/dL (ref 65–99)
GLUCOSE-CAPILLARY: 75 mg/dL (ref 65–99)
Glucose-Capillary: 84 mg/dL (ref 65–99)
Glucose-Capillary: 83 mg/dL (ref 65–99)
Glucose-Capillary: 90 mg/dL (ref 65–99)

## 2016-02-27 LAB — LIPID PANEL
CHOL/HDL RATIO: 4.5 ratio
Cholesterol: 107 mg/dL (ref 0–200)
HDL: 24 mg/dL — AB (ref >40)
LDL CALC: 69 mg/dL (ref 0–99)
Triglycerides: 72 mg/dL (ref <150)
VLDL: 14 mg/dL (ref 0–40)

## 2016-02-27 LAB — CBC
HEMATOCRIT: 34.9 % — AB (ref 39.0–52.0)
Hemoglobin: 10.8 g/dL — ABNORMAL LOW (ref 13.0–17.0)
MCH: 21.4 pg — AB (ref 26.0–34.0)
MCHC: 30.9 g/dL (ref 30.0–36.0)
MCV: 69.1 fL — AB (ref 78.0–100.0)
Platelets: 257 10*3/uL (ref 150–400)
RBC: 5.05 MIL/uL (ref 4.22–5.81)
RDW: 23.5 % — ABNORMAL HIGH (ref 11.5–15.5)
WBC: 5.2 10*3/uL (ref 4.0–10.5)

## 2016-02-27 LAB — HEPATITIS PANEL, ACUTE
HEP B S AG: NEGATIVE
Hep A IgM: NEGATIVE
Hep B C IgM: NEGATIVE

## 2016-02-27 LAB — COMPREHENSIVE METABOLIC PANEL
ALBUMIN: 2.7 g/dL — AB (ref 3.5–5.0)
ALK PHOS: 102 U/L (ref 38–126)
ALT: 14 U/L — AB (ref 17–63)
ANION GAP: 9 (ref 5–15)
AST: 29 U/L (ref 15–41)
BUN: 18 mg/dL (ref 6–20)
CALCIUM: 8.4 mg/dL — AB (ref 8.9–10.3)
CO2: 24 mmol/L (ref 22–32)
Chloride: 102 mmol/L (ref 101–111)
Creatinine, Ser: 1.83 mg/dL — ABNORMAL HIGH (ref 0.61–1.24)
GFR calc Af Amer: 46 mL/min — ABNORMAL LOW (ref >60)
GFR calc non Af Amer: 40 mL/min — ABNORMAL LOW (ref >60)
GLUCOSE: 88 mg/dL (ref 65–99)
Potassium: 4.3 mmol/L (ref 3.5–5.1)
SODIUM: 135 mmol/L (ref 135–145)
Total Bilirubin: 1.9 mg/dL — ABNORMAL HIGH (ref 0.3–1.2)
Total Protein: 6.8 g/dL (ref 6.5–8.1)

## 2016-02-27 LAB — C DIFFICILE QUICK SCREEN W PCR REFLEX
C Diff antigen: NEGATIVE
C Diff interpretation: NEGATIVE
C Diff toxin: NEGATIVE

## 2016-02-27 LAB — PROTIME-INR
INR: 2.96 — ABNORMAL HIGH (ref 0.00–1.49)
Prothrombin Time: 30.3 seconds — ABNORMAL HIGH (ref 11.6–15.2)

## 2016-02-27 LAB — FERRITIN: Ferritin: 38 ng/mL (ref 24–336)

## 2016-02-27 MED ORDER — FOLIC ACID 1 MG PO TABS
1.0000 mg | ORAL_TABLET | Freq: Every day | ORAL | Status: DC
  Administered 2016-02-27: 1 mg via ORAL
  Filled 2016-02-27: qty 1

## 2016-02-27 MED ORDER — LOPERAMIDE HCL 2 MG PO CAPS
2.0000 mg | ORAL_CAPSULE | ORAL | Status: DC | PRN
  Administered 2016-02-27: 2 mg via ORAL
  Filled 2016-02-27: qty 1

## 2016-02-27 MED ORDER — WARFARIN SODIUM 5 MG PO TABS: 2.5000 mg | ORAL_TABLET | Freq: Once | ORAL | Status: DC

## 2016-02-27 MED ORDER — FERROUS SULFATE 325 (65 FE) MG PO TABS: 325.0000 mg | ORAL_TABLET | Freq: Two times a day (BID) | ORAL | Status: DC

## 2016-02-27 MED ORDER — FOLIC ACID 1 MG PO TABS: 1.0000 mg | ORAL_TABLET | Freq: Every day | ORAL | Status: DC

## 2016-02-27 MED ORDER — FERROUS SULFATE 325 (65 FE) MG PO TABS
325.0000 mg | ORAL_TABLET | Freq: Two times a day (BID) | ORAL | Status: DC
  Administered 2016-02-27: 325 mg via ORAL
  Filled 2016-02-27: qty 1

## 2016-02-27 MED FILL — FOLIC ACID 1 MG TABLET: 1 | 30 days supply | Qty: 30 | Fill #0

## 2016-02-27 NOTE — Progress Notes (Signed)
NCM contacted [facility] pharmacy and pt can pick up his meds from pharmacy and set up an account to make payments. Made patient aware. Jonnie Finner RN CCM Case Mgmt phone [phone]

## 2016-02-27 NOTE — Plan of Care (Signed)
Problem: Activity: Goal: Ability to tolerate increased activity will improve Outcome: Completed/Met Date Met:  02/26/16 Patient remains in atrial fibrillation with controlled rates 65-85 on oral metoprolol. Anticoagulated with PO warfarin; INR 2.57 this AM. No reports of dyspnea with exertion today. Patient observed ambulating to toilet and back without difficulty this evening.  Of note:  Patient is having frequent, watery, bilious-appearing stools; >4 in the past several hours. Placed patient on enteric precautions, per protocol, and notified NP on-call for [facility]. Orders were received to rule-out c. difficile by quick PCR. This was collected and hand-delivered to lab shortly after 22:00. Awaiting lab results.  Some abdominal distention still present, and patient is tender on palpation. Otherwise, patient has not had complaint of abdominal pain this shift and is tolerating clear liquid diet. Bowel sounds present in all four quadrants; hyperactive.  Vital signs WDL: Filed Vitals:    02/26/16 1001 02/26/16 1215 02/26/16 1934 02/26/16 2315  BP:   128/86 128/85 115/77  Pulse: 71 72 78 77  Temp:   97.7 F (36.5 C) 97.6 F (36.4 C) 98.2 F (36.8 C)  TempSrc:   Oral Oral Oral  Resp:   _0 Height:          Weight:          SpO2:   97% 98% 98%    Basic education earlier this evening re:  Plan of care (atrial arrhythmia)  Labs/tests/procedures:  Fasting lipid profile  Complete metabolic panel  Iron and TIBC  C. difficile PCR testing  Medications:  Piperacillin-tazobactam  Allopurinol  Metoprolol  Patient does speak English, however, there appears to be somewhat of an educational/language barrier despite this. He deferred further education after medications were reviewed.  Continuing to monitor Will also follow for further educational needs.

## 2016-02-27 NOTE — Progress Notes (Signed)
Patient discharge paperwork and instructions gone over in detail with patient and daughter Chong Sicilian. All questions answered to patients satisfaction with teach back. Telemetry discontinued, IV's removed intact. Patient discharged home with family by way of wheelchair.

## 2016-02-27 NOTE — Progress Notes (Signed)
ANTICOAGULATION CONSULT NOTE - Follow Up Consult  Pharmacy Consult for Coumadin Indication: atrial fibrillation  No Known Allergies  Patient Measurements: Height: 5\' 4"  (162.6 cm) Weight: 204 lb 12.8 oz (92.897 kg) IBW/kg (Calculated) : 59.2  Vital Signs: Temp: 98.3 F (36.8 C) (04/12 0342) Temp Source: Oral (04/12 0342) BP: 116/80 mmHg (04/12 0812) Pulse Rate: 73 (04/12 0812)  Labs:  Recent Labs  02/25/16 1004 02/26/16 0408 02/26/16 0842 02/27/16 0639  HGB 9.9* 10.0*  --  10.8*  HCT 32.9* 33.9*  --  34.9*  PLT 235 233  --  257  LABPROT 23.1*  --  27.2* 30.3*  INR 2.07*  --  2.57* 2.96*  CREATININE 1.95* 2.05*  --  1.83*    Estimated Creatinine Clearance: 46.3 mL/min (by C-G formula based on Cr of 1.83).   Medications:  Prescriptions prior to admission  Medication Sig Dispense Refill Last Dose  . allopurinol (ZYLOPRIM) 100 MG tablet Take 1 tablet (100 mg total) by mouth 2 (two) times daily. 60 tablet 2 02/24/2016 at Unknown time  . acetaminophen (TYLENOL) 325 MG tablet Take 650 mg by mouth every 6 (six) hours as needed for headache (pain). Reported on 01/10/2016   02/18/2016 at Unknown time  . Blood Glucose Monitoring Suppl (ACCU-CHEK AVIVA) device Use as Instructed 3 times daily 1 each 0 Taking  . colchicine 0.6 MG tablet Take 2 tablets (1.2 mg) at the onset of a gout flare, may repeat 1 tablet (0.6 mg) in one hour if symptoms persist. 30 tablet 2 02/18/2016 at Unknown time  . digoxin (LANOXIN) 0.125 MG tablet Take 1 tablet (0.125 mg total) by mouth daily. 30 tablet 11 02/18/2016 at Unknown time  . diphenhydramine-acetaminophen (TYLENOL PM) 25-500 MG TABS tablet Take 1 tablet by mouth daily as needed. For sleep   02/18/2016 at Unknown time  . docusate sodium (COLACE) 100 MG capsule Take 1 capsule (100 mg total) by mouth 2 (two) times daily. For constipation 60 capsule 0 02/18/2016 at Unknown time  . furosemide (LASIX) 80 MG tablet Take 1 tablet (80 mg total) by mouth 2 (two) times  daily. 60 tablet 3 02/18/2016 at Unknown time  . glucose blood (ACCU-CHEK AVIVA) test strip Use 3 times daily as directed. 100 each 5 Taking  . insulin glargine (LANTUS) 100 UNIT/ML injection Inject 0.2 mLs (20 Units total) into the skin at bedtime. 10 mL 2 02/18/2016 at Unknown time  . isosorbide mononitrate (IMDUR) 30 MG 24 hr tablet Take 0.5 tablets (15 mg total) by mouth daily. (Patient taking differently: Take 15 mg by mouth daily with supper. ) 30 tablet 11 02/18/2016 at Unknown time  . Lancet Devices (ACCU-CHEK SOFTCLIX) lancets Use as instructed 3 times daily 1 each 5 Taking  . lidocaine (XYLOCAINE) 2 % solution Use as directed 10 mLs in the mouth or throat every 6 (six) hours as needed for mouth pain. 100 mL 0 02/18/2016 at Unknown time  . metoprolol (LOPRESSOR) 100 MG tablet Take 100 mg by mouth 2 (two) times daily.   02/18/2016 at 0800  . potassium chloride SA (K-DUR,KLOR-CON) 20 MEQ tablet Take 1 tablet (20 mEq total) by mouth daily. 30 tablet 11 02/18/2016 at Unknown time  . traMADol (ULTRAM) 50 MG tablet Take 1 tablet (50 mg total) by mouth every 8 (eight) hours as needed. 30 tablet 0 02/18/2016 at Unknown time  . Vitamin D, Ergocalciferol, (DRISDOL) 50000 UNITS CAPS capsule Take 50,000 Units by mouth every 7 (seven) days. Reported on 01/10/2016.  Take on Mondays   02/18/2016 at Unknown time  . warfarin (COUMADIN) 5 MG tablet Take 1.5 tablets (7.5 mg total) by mouth at bedtime. For next 4 days then based on INR Monday  0     Assessment: 55 yo M on Coumadin PTA for hx afib.  INR trending up likely as a result of reduced PO intake.    Goal of Therapy:  INR 2-3 Monitor platelets by anticoagulation protocol: Yes   Plan:  No Coumadin tonight Continue daily INR  [employer], Pharm.D., BCPS Clinical Pharmacist Pager [phone] 02/27/2016 11:13 AM

## 2016-02-28 ENCOUNTER — Telehealth: Payer: Self-pay

## 2016-02-28 ENCOUNTER — Inpatient Hospital Stay: Payer: Medicare Other | Admitting: Family Medicine

## 2016-02-28 NOTE — Discharge Summary (Signed)
[facility] Discharge Summary   Patient: Michael [name] [id number]   PCP: [name], MD DOB: 1961-10-09   Date of admission: 02/25/2016   Date of discharge: 02/27/2016     Discharge Diagnoses:  Principal Problem:   Viral gastroenteritis Active Problems:   CKD (chronic kidney disease), stage IV ([place])   Essential hypertension   Mitral valve disease   Diabetes mellitus with complication (HCC)   Chronic atrial fibrillation with RVR (HCC)   Chronic combined systolic (congestive) and diastolic (congestive) heart failure (HCC)   Anemia, chronic disease   Aortic regurgitation   Recommendations for Outpatient Follow-up:  1. Follow-up with PCP in one week 2. Follow-up with anticoagulation on Friday.   Follow-up Information    Follow up with [facility]. Go on 03/04/2016.   Specialty:  Internal Medicine   Why:  at 3;00pm for an appointment with Dr Jarold Song at the [facility].   Contact information:   [address] [id number] [place] [phone]      Follow up with [facility] Schedule an appointment as soon as possible for a visit in 2 days.   Specialty:  Cardiology   Why:  for anticoagulation clinic. needs INR on 02/29/2016   Contact information:   [address] [place] [phone]     Diet recommendation: Cardiac and diabetic diet  Activity: The patient is advised to gradually reintroduce usual activities.  Discharge Condition: good  History of present illness: As per the H and P dictated on admission, "Abdominal pain: present for approximately 2 days. Constant. Decreased oral intake. Worsening prior to presentation to the ED. Patient states that denies any vomiting or diarrhea. Endorses daily soft bowel movements. Associated with some nausea. Patient states she's missed some doses of his home medications due to his symptoms.  Denies any chest pain, fevers, dysuria comfort, flank pain, rash. "  Hospital Course:  Summary of his active problems in the hospital is as following.  Principal Problem:   Viral gastroenteritis Patient presented with numbness of abdominal pain. Ultrasound abdomen showed evidence of gallbladder wall thickening. Also it showed suspected cirrhosis. CT abdomen showed possible ileus or gastroenteritis. The patient was initiated on clear liquid diet. Patient was advanced to full liquid as well as later on a regular diet and patient tolerated it very well. Did not have any episode of vomiting while he was here in the hospital. The patient initially had episodes of diarrhea with loose watery bowel movement. C. difficile was negative. Patient was given one dose of Imodium with significant improvement. At present his diarrhea was thought to be secondary to viral gastroenteritis as well as possible use of laxatives. Laxatives were discontinued on discharge. No Imodium was provided on discharge due to concern for ileus.  Active Problems:   CKD (chronic kidney disease), stage IV (HCC) Renal function remained the stable.    Essential hypertension   Blood pressure was stable. Continue home medication on discharge.    Diabetes mellitus with complication (HCC)   Oral intake has been adequate. Continue home insulin regimen.  Chronic atrial fibrillation with RVR (HCC) CHA2DS2-VASc = 4. INR elevated. Continue digoxin and metoprolol. Troponins were negative. No evidence RVR in the hospital. Warfarin doses need to be readjusted on Friday, 02/29/2016. At present we will hold the dose on 02/27/2016 and patient will be recommended to take 2.5 mg warfarin on 02/28/2016.   Chronic combined systolic (  congestive) and diastolic (congestive) heart failure (HCC) Chronic on clinical examination. Continue Lasix.  Cirrhosis of the liver with ascites. Ultrasound as well as CT scan shows evidence of mild  ascites with cirrhosis of the liver. Next and patient also has hypoalbuminemia. With this the patient will require further outpatient workup regarding these finding.  All other chronic medical condition were stable during the hospitalization.  Patient was ambulatory without any assistance. On the day of the discharge the patient's diarrhea resolved and vital signs remained stable, and no other acute medical condition were reported by patient. the patient was felt safe to be discharge at [facility] with FAMILY.  Procedures and Results:  NONE   Consultations:  none  DISCHARGE MEDICATION: Discharge Medication List as of 02/27/2016  6:07 PM    START taking these medications   Details  ferrous sulfate 325 (65 FE) MG tablet Take 1 tablet (325 mg total) by mouth 2 (two) times daily with a meal., Starting 02/27/2016, Until Discontinued, Normal    folic acid (FOLVITE) 1 MG tablet Take 1 tablet (1 mg total) by mouth daily., Starting 02/27/2016, Until Discontinued, Normal      CONTINUE these medications which have CHANGED   Details  warfarin (COUMADIN) 5 MG tablet Take 0.5 tablets (2.5 mg total) by mouth one time only at 6 PM. then based on INR Friday. 02/29/2016, Starting 02/28/2016, Until Discontinued, No Print      CONTINUE these medications which have NOT CHANGED   Details  acetaminophen (TYLENOL) 325 MG tablet Take 650 mg by mouth every 6 (six) hours as needed for headache (pain). Reported on 01/10/2016, Until Discontinued, Historical Med    allopurinol (ZYLOPRIM) 100 MG tablet Take 1 tablet (100 mg total) by mouth 2 (two) times daily., Starting 01/10/2016, Until Discontinued, Normal    Blood Glucose Monitoring Suppl (ACCU-CHEK AVIVA) device Use as Instructed 3 times daily, Normal    colchicine 0.6 MG tablet Take 2 tablets (1.2 mg) at the onset of a gout flare, may repeat 1 tablet (0.6 mg) in one hour if symptoms persist., Normal    digoxin (LANOXIN) 0.125 MG tablet Take 1 tablet (0.125 mg  total) by mouth daily., Starting 10/17/2015, Until Discontinued, Normal    diphenhydramine-acetaminophen (TYLENOL PM) 25-500 MG TABS tablet Take 1 tablet by mouth daily as needed. For sleep, Until Discontinued, Historical Med    furosemide (LASIX) 80 MG tablet Take 1 tablet (80 mg total) by mouth 2 (two) times daily., Starting 01/04/2016, Until Discontinued, Print    glucose blood (ACCU-CHEK AVIVA) test strip Use 3 times daily as directed., Normal    insulin glargine (LANTUS) 100 UNIT/ML injection Inject 0.2 mLs (20 Units total) into the skin at bedtime., Starting 01/10/2016, Until Discontinued, Normal    isosorbide mononitrate (IMDUR) 30 MG 24 hr tablet Take 0.5 tablets (15 mg total) by mouth daily., Starting 10/24/2015, Until Discontinued, Normal    Lancet Devices (ACCU-CHEK SOFTCLIX) lancets Use as instructed 3 times daily, Normal    lidocaine (XYLOCAINE) 2 % solution Use as directed 10 mLs in the mouth or throat every 6 (six) hours as needed for mouth pain., Starting 02/12/2016, Until Discontinued, Normal    metoprolol (LOPRESSOR) 100 MG tablet Take 100 mg by mouth 2 (two) times daily., Until Discontinued, Historical Med    potassium chloride SA (K-DUR,KLOR-CON) 20 MEQ tablet Take 1 tablet (20 mEq total) by mouth daily., Starting 10/24/2015, Until Discontinued, Normal    traMADol (ULTRAM) 50 MG tablet Take 1 tablet (50 mg total)  by mouth every 8 (eight) hours as needed., Starting 01/10/2016, Until Discontinued, Print    Vitamin D, Ergocalciferol, (DRISDOL) 50000 UNITS CAPS capsule Take 50,000 Units by mouth every 7 (seven) days. Reported on 01/10/2016. Take on Mondays, Until Discontinued, Historical Med      STOP taking these medications     docusate sodium (COLACE) 100 MG capsule        No Known Allergies Discharge Instructions    Diet - low sodium heart healthy    Complete by:  As directed      Diet Carb Modified    Complete by:  As directed      Discharge instructions    Complete  by:  As directed   It is important that you read following instructions as well as go over your medication list with RN to help you understand your care after this hospitalization.  Discharge Instructions: Please follow-up with PCP in one week Please follow up with INR clinic on Friday 02/29/2016.  Please request your primary care physician to go over all Hospital Tests and Procedure/Radiological results at the follow up,  Please get all Hospital records sent to your PCP by signing hospital release before you go home.   Do not take more than prescribed Pain, Sleep and Anxiety Medications. You were cared for by a hospitalist during your hospital stay. If you have any questions about your discharge medications or the care you received while you were in the hospital after you are discharged, you can call the unit and ask to speak with the hospitalist on call if the hospitalist that took care of you is not available.  Once you are discharged, your primary care physician will handle any further medical issues. Please note that NO REFILLS for any discharge medications will be authorized once you are discharged, as it is imperative that you return to your primary care physician (or establish a relationship with a primary care physician if you do not have one) for your aftercare needs so that they can reassess your need for medications and monitor your lab values. You Must read complete instructions/literature along with all the possible adverse reactions/side effects for all the Medicines you take and that have been prescribed to you. Take any new Medicines after you have completely understood and accept all the possible adverse reactions/side effects. Wear Seat belts while driving. If you have smoked or chewed Tobacco in the last 2 yrs please stop smoking and/or stop any Recreational drug use.     Increase activity slowly    Complete by:  As directed           Discharge Exam: Filed Weights    02/25/16 1827 02/26/16 0500 02/27/16 0342  Weight: 90.583 kg (199 lb 11.2 oz) 91.218 kg (201 lb 1.6 oz) 92.897 kg (204 lb 12.8 oz)   Filed Vitals:   02/27/16 1300 02/27/16 1519  BP: 117/77 147/103  Pulse: 51   Temp: 97.4 F (36.3 C)   Resp: 16    General: Appear in no distress, no Rash; Oral Mucosa moist. Cardiovascular: S1 and S2 Present, no Murmur, no JVD Respiratory: Bilateral Air entry present and Clear to Auscultation, no Crackles, no wheezes Abdomen: Bowel Sound present, Soft and no tenderness Extremities: no Pedal edema, no calf tenderness Neurology: Grossly no focal neuro deficit.  The results of significant diagnostics from this hospitalization (including imaging, microbiology, ancillary and laboratory) are listed below for reference.    Significant Diagnostic Studies: Dg Chest 2 View  02/19/2016  CLINICAL DATA:  Short of breath today EXAM: CHEST  2 VIEW COMPARISON:  12/31/2015 FINDINGS: Severe cardiomegaly is stable. Normal vascularity. No pneumothorax. No pleural effusion. IMPRESSION: Cardiomegaly without decompensation. Electronically Signed   By: Marybelle Killings M.D.   On: 02/19/2016 15:49   Ct Abdomen Pelvis W Contrast  02/25/2016  CLINICAL DATA:  Abdominal pain EXAM: CT ABDOMEN AND PELVIS WITH CONTRAST TECHNIQUE: Multidetector CT imaging of the abdomen and pelvis was performed using the standard protocol following bolus administration of intravenous contrast. CONTRAST:  80 mL Isovue-300 IV COMPARISON:  Renal ultrasound 09/11/2015 FINDINGS: Lower chest: . Small bilateral pleural effusions with mild compressive atelectasis right lower lobe. Cardiac enlargement. Left atrial enlargement. Prior mitral valve repair. Hepatobiliary: Liver is smaller than expected with irregular contour of the capsule suggestive of cirrhosis. No focal liver mass. Moderate ascites also consistent with cirrhosis. Gallbladder and bile ducts normal. Pancreas: Negative Spleen: Negative Adrenals/Urinary Tract:  No renal mass or obstruction. There is asymmetric contrast excretion which is slightly delayed on the right. The etiology is not apparent. No ureteral stone or mass. Urinary bladder normal. Stomach/Bowel: Small bowel air-fluid levels in the left abdomen without significant dilatation of bowel. There is no colonic edema or dilatation. Appendix not well visualized but no evidence of acute appendicitis. Vascular/Lymphatic: Mild atherosclerotic calcification in the aorta and iliac arteries without aneurysm. No adenopathy. Reproductive: Mild prostate enlargement with prostate calcification. Other: Moderate ascites Musculoskeletal: No acute skeletal abnormality. IMPRESSION: Small bilateral effusions Moderate ascites with probable cirrhosis of the liver. Cardiac enlargement with left atrial enlargement. Prior mitral valve repair. Mild asymmetric delayed excretion of the right kidney. No stone or mass is seen. Cause of this discrepancy is not clear. Air-fluid levels in nondilated small bowel in the left abdomen. This may be due to gastroenteritis or ileus. Early small bowel obstruction not excluded. Electronically Signed   By: Franchot Gallo M.D.   On: 02/25/2016 12:05   US Abdomen Limited Ruq  02/25/2016  CLINICAL DATA:  Cirrhosis of the liver. Pancreatitis. Ascites. Hypoalbuminemia. EXAM: US ABDOMEN LIMITED - RIGHT UPPER QUADRANT COMPARISON:  Abdomen CT obtained today. FINDINGS: Gallbladder: Mild diffuse wall thickening with minimal hypoechoic edema. The maximum wall thickness is 3.2 mm. No gallstones seen. The patient was not focally tender over the gallbladder. Common bile duct: Diameter: 4.1 mm proximally. Liver: Mildly lobulated contours. Normal echogenicity. 8 mm linear echogenic focus in the posterior aspect of the right lobe, with no corresponding abnormality on the recent CT. Other:  Small amount of free peritoneal fluid. IMPRESSION: 1. Mild diffuse gallbladder wall thickening with minimal hypoechoic wall  edema. This is most likely due to the patient's hypoalbuminemia. 2. Mildly lobulated liver contours, compatible with the history of cirrhosis. 3. Probable developing calcification in the posterior aspect of the right lobe of the liver. 4. Small amount of ascites. Electronically Signed   By: Claudie Revering M.D.   On: 02/25/2016 17:50    Microbiology: Recent Results (from the past 240 hour(s))  MRSA PCR Screening     Status: None   Collection Time: 02/19/16  5:39 PM  Result Value Ref Range Status   MRSA by PCR NEGATIVE NEGATIVE Final    Comment:        The GeneXpert MRSA Assay (FDA approved for NASAL specimens only), is one component of a comprehensive MRSA colonization surveillance program. It is not intended to diagnose MRSA infection nor to guide or monitor treatment for MRSA infections.   C difficile quick  scan w PCR reflex     Status: None   Collection Time: 02/26/16 10:06 PM  Result Value Ref Range Status   C Diff antigen NEGATIVE NEGATIVE Final   C Diff toxin NEGATIVE NEGATIVE Final   C Diff interpretation Negative for toxigenic C. difficile  Final     Labs: CBC:  Recent Labs Lab 02/25/16 1004 02/26/16 0408 02/27/16 0639  WBC 6.0 5.4 5.2  HGB 9.9* 10.0* 10.8*  HCT 32.9* 33.9* 34.9*  MCV 68.8* 69.6* 69.1*  PLT 235 233 [id number]   Basic Metabolic Panel:  Recent Labs Lab 02/25/16 1004 02/25/16 1701 02/26/16 0408 02/27/16 0639  NA 139  --  140 135  K 5.0  --  4.8 4.3  CL 105  --  107 102  CO2 23  --  22 24  GLUCOSE 129*  --  102* 88  BUN 22*  --  24* 18  CREATININE 1.95*  --  2.05* 1.83*  CALCIUM 9.1  --  8.6* 8.4*  MG  --  1.8  --   --   PHOS  --  3.9  --   --    Liver Function Tests:  Recent Labs Lab 02/25/16 1004 02/26/16 0408 02/27/16 0639  AST 32 31 29  ALT 16* 15* 14*  ALKPHOS 120 105 102  BILITOT 3.3* 2.6* 1.9*  PROT 7.6 6.8 6.8  ALBUMIN 3.2* 2.9* 2.7*    Recent Labs Lab 02/25/16 1004  LIPASE 76*   No results for input(s): AMMONIA in  the last 168 hours. Cardiac Enzymes: No results for input(s): CKTOTAL, CKMB, CKMBINDEX, TROPONINI in the last 168 hours. BNP (last 3 results)  Recent Labs  10/09/15 1750 12/29/15 1436 02/19/16 1528  BNP 666.2* 1453.8* 533.9*   CBG:  Recent Labs Lab 02/26/16 2314 02/27/16 0406 02/27/16 0729 02/27/16 1128 02/27/16 1619  GLUCAP 84 86 75 83 90   Time spent: 30 minutes  Signed:  Berle Mull  [facility] 02/27/2016 , 6:13 PM

## 2016-02-28 NOTE — Telephone Encounter (Signed)
[facility] Post-discharge Follow-Up Phone Call:  Date of Discharge: 02/27/16 Principal Discharge Diagnosis(es): Abdominal pain, Atrial fibrillation with RVR. Discharge summary not yet available.  Post-discharge Communication: Attempt #2 to reach patient and complete post-discharge follow-up phone call. This Case Manager placed additional call to patient using [employer] (Interpreter [phone]); unable to reach patient. Voicemail left for patient. Medication list indicated patient needs repeat INR on 02/29/16. Appointment on 02/29/16 at 1400 [facility].  Attempted once again to reach patient to discuss. Also placed call to patient's emergency contact, Pattie Sivansay at [phone] and [phone]. Unable to reach emergency contact at either number or leave voicemail requesting return call. Call Completed: No

## 2016-02-28 NOTE — Telephone Encounter (Signed)
[facility] Post-discharge Follow-Up Phone Call:  Date of Discharge: 02/27/16 Principal Discharge Diagnosis(es): Abdominal pain, Atrial fibrillation with RVR. Discharge summary not yet available. Post-discharge Communication: Attempt #1 to reach patient and complete post-discharge follow-up phone call. This Case Manager placed call to patient using [employer] (Interpreter [phone]); unable to reach patient. Voicemail left informing patient he would be contacted again at a later time. Medication list indicated patient needs repeat INR on 02/29/16.  Unable to reach patient to discuss. Appointment scheduled for 02/29/16 at 1400 at [facility]. Will attempt to reach patient again at a later time. Call completed: No

## 2016-02-29 ENCOUNTER — Telehealth: Payer: Self-pay

## 2016-02-29 ENCOUNTER — Ambulatory Visit (INDEPENDENT_AMBULATORY_CARE_PROVIDER_SITE_OTHER): Payer: Medicare Other

## 2016-02-29 DIAGNOSIS — Z5181 Encounter for therapeutic drug level monitoring: Secondary | ICD-10-CM

## 2016-02-29 DIAGNOSIS — I079 Rheumatic tricuspid valve disease, unspecified: Secondary | ICD-10-CM

## 2016-02-29 DIAGNOSIS — I4891 Unspecified atrial fibrillation: Secondary | ICD-10-CM | POA: Diagnosis not present

## 2016-02-29 DIAGNOSIS — I059 Rheumatic mitral valve disease, unspecified: Secondary | ICD-10-CM | POA: Diagnosis not present

## 2016-02-29 LAB — POCT INR: INR: 2.2

## 2016-02-29 NOTE — Telephone Encounter (Signed)
[facility] Clinic Post-discharge Follow-Up Phone Call:  Date of Discharge: 02/27/16 Principal Discharge Diagnosis(es): Viral gastroenteritis Call Completed: Yes              With Whom: Patient Interpreter NeededVelta [name] ([place] # [phone])               Language/Dialect: Laotian     Please check all that apply:  X  Patient is knowledgeable of his/her condition(s) and/or treatment. X  Patient is caring for self at home.  ? Patient is receiving assist at home from family and/or caregiver. Family and/or caregiver is knowledgeable of patient's condition(s) and/or treatment. ? Patient is receiving home health services. If so, name of agency.     Medication Reconciliation:  X  Patient obtained all discharge medications-No. Thoroughly reviewed all of patient's discharge medications. Patient indicated he had not picked up ferrous sulfate or folic acid. Instructed patient to pick up medications as soon as possible. Also instructed patient to stop taking colace. Patient verbalized understanding. Informed patient of appointment at [facility] today at 1400 for INR. Patient verbalized understanding. Patient reminded to continue keeping record of his blood glucose readings and to bring log to his upcoming appointment with Dr. Jarold Song. Patient verbalized understanding.   Activities of Daily Living:  X  Independent ? Needs assist  ? Total Care   Community resources in place for patient:  X  None  ? Home Health/Home DME ? Assisted Living ? Support Group             Questions/Concerns discussed: This Case Manager placed call to patient to check status. Patient denied any problems or concerns. Patient informed he had been scheduled an appointment at [facility] for INR check today at 1400. Patient verbalized understanding. Patient also reminded of Transitional Care appointment on 03/04/16 at 1500 with Dr. Jarold Song. Patient verbalized  understanding and appreciative of reminder. No additional needs/concerns identified.

## 2016-03-03 ENCOUNTER — Telehealth: Payer: Self-pay

## 2016-03-03 NOTE — Telephone Encounter (Signed)
Attempted to contact the patient to remind him of his appointment at the [facility] tomorrow, 03/04/16 @ 1500. Call placed to # [phone] with the assistance of Laotian interpreter # [phone] with [facility] [employer]. A voice mail message was left noting that the [facility] would call back at a later time.

## 2016-03-04 ENCOUNTER — Encounter: Payer: Self-pay | Admitting: Family Medicine

## 2016-03-04 ENCOUNTER — Ambulatory Visit: Payer: Medicare Other | Attending: Family Medicine | Admitting: Family Medicine

## 2016-03-04 VITALS — BP 137/87 | HR 145 | Temp 98.0°F | Resp 18 | Ht 64.0 in | Wt 204.8 lb

## 2016-03-04 DIAGNOSIS — K219 Gastro-esophageal reflux disease without esophagitis: Secondary | ICD-10-CM | POA: Insufficient documentation

## 2016-03-04 DIAGNOSIS — Z7901 Long term (current) use of anticoagulants: Secondary | ICD-10-CM | POA: Diagnosis not present

## 2016-03-04 DIAGNOSIS — M25571 Pain in right ankle and joints of right foot: Secondary | ICD-10-CM

## 2016-03-04 DIAGNOSIS — I10 Essential (primary) hypertension: Secondary | ICD-10-CM

## 2016-03-04 DIAGNOSIS — N184 Chronic kidney disease, stage 4 (severe): Secondary | ICD-10-CM | POA: Diagnosis not present

## 2016-03-04 DIAGNOSIS — Z794 Long term (current) use of insulin: Secondary | ICD-10-CM | POA: Diagnosis not present

## 2016-03-04 DIAGNOSIS — I5022 Chronic systolic (congestive) heart failure: Secondary | ICD-10-CM | POA: Diagnosis not present

## 2016-03-04 DIAGNOSIS — M109 Gout, unspecified: Secondary | ICD-10-CM | POA: Insufficient documentation

## 2016-03-04 DIAGNOSIS — Z9119 Patient's noncompliance with other medical treatment and regimen: Secondary | ICD-10-CM | POA: Diagnosis not present

## 2016-03-04 DIAGNOSIS — I482 Chronic atrial fibrillation, unspecified: Secondary | ICD-10-CM

## 2016-03-04 DIAGNOSIS — E1121 Type 2 diabetes mellitus with diabetic nephropathy: Secondary | ICD-10-CM | POA: Diagnosis not present

## 2016-03-04 DIAGNOSIS — A084 Viral intestinal infection, unspecified: Secondary | ICD-10-CM | POA: Diagnosis not present

## 2016-03-04 DIAGNOSIS — Z79899 Other long term (current) drug therapy: Secondary | ICD-10-CM | POA: Diagnosis not present

## 2016-03-04 LAB — GLUCOSE, POCT (MANUAL RESULT ENTRY): POC Glucose: 114 mg/dl — AB (ref 70–99)

## 2016-03-04 MED ORDER — TRAMADOL HCL 50 MG PO TABS: 50.0000 mg | ORAL_TABLET | Freq: Three times a day (TID) | ORAL | Status: DC | PRN

## 2016-03-04 MED ORDER — METOPROLOL TARTRATE 100 MG PO TABS: 100.0000 mg | ORAL_TABLET | Freq: Two times a day (BID) | ORAL | Status: DC

## 2016-03-04 MED ORDER — OMEPRAZOLE 20 MG PO CPDR: 20.0000 mg | DELAYED_RELEASE_CAPSULE | Freq: Every day | ORAL | Status: DC

## 2016-03-04 MED ORDER — FUROSEMIDE 80 MG PO TABS: 80.0000 mg | ORAL_TABLET | Freq: Two times a day (BID) | ORAL | Status: DC

## 2016-03-04 MED FILL — FUROSEMIDE 80 MG TABLET: 80 | 30 days supply | Qty: 60 | Fill #0

## 2016-03-04 MED FILL — OMEPRAZOLE DR 20 MG CAPSULE: 20 | 30 days supply | Qty: 30 | Fill #0

## 2016-03-04 MED FILL — METOPROLOL TARTRATE 100 MG: 100 | 30 days supply | Qty: 60 | Fill #0

## 2016-03-04 NOTE — Progress Notes (Signed)
[place]   Date of telephone encounter:02/28/16  Admit date: 02/25/16 Discharge date: 02/27/16  Subjective:  Patient ID: Michael [name], male    DOB: 08/09/1961  Age: 55 y.o. MRN: [id number]  CC: Follow-up; Abdominal Pain; and Medication Refill   HPI Michael [name]  is a 55 y.o. male seen with the aid of the language line with a history of Type 2DM (A1c 7.3), hypertension, hyperlipidemia, mitral valve replacement with porcine valve, tricuspid valve repair, atrial fibrillation (on anticoagulation with Coumadin),Chronic systolic heartfailure  (EF 40 -45% from 01/2016), CKD stage IV, Gout here for follow-up of the transitional care clinic.   He was recently hospitalized for viral gastroenteritis and abdominal ultrasound showed gallbladder thickening and suspected cirrhosis, CT abdomen showed possible evidence of gastroenteritis. C. Difficile was negative and he was hydrated and subsequently discharged. Prior to this hospitalization he was admitted from 02/19/16 through 02/21/16 after referred from the Coumadin clinic and found to be in atrial fibrillation and was symptomatic. Symptoms were thought to be secondary to noncompliance with medications and he was treated with IV Cardizem.  Today he informs me he has abdominal bloating but denies abdominal pain, diarrhea, constipation, nausea or vomiting. He admits to some reflux symptoms but states he feels well overall. He does have baseline shortness of breath which has not worsened  Outpatient Prescriptions Prior to Visit  Medication Sig Dispense Refill  . acetaminophen (TYLENOL) 325 MG tablet Take 650 mg by mouth every 6 (six) hours as needed for headache (pain). Reported on 01/10/2016    . allopurinol (ZYLOPRIM) 100 MG tablet Take 1 tablet (100 mg total) by mouth 2 (two) times daily. 60 tablet 2  . Blood Glucose Monitoring Suppl (ACCU-CHEK AVIVA) device Use as Instructed 3 times daily 1 each 0  . colchicine 0.6 MG tablet Take  2 tablets (1.2 mg) at the onset of a gout flare, may repeat 1 tablet (0.6 mg) in one hour if symptoms persist. 30 tablet 2  . digoxin (LANOXIN) 0.125 MG tablet Take 1 tablet (0.125 mg total) by mouth daily. 30 tablet 11  . diphenhydramine-acetaminophen (TYLENOL PM) 25-500 MG TABS tablet Take 1 tablet by mouth daily as needed. For sleep    . ferrous sulfate 325 (65 FE) MG tablet Take 1 tablet (325 mg total) by mouth 2 (two) times daily with a meal. 60 tablet 0  . folic acid (FOLVITE) 1 MG tablet Take 1 tablet (1 mg total) by mouth daily. 30 tablet 0  . glucose blood (ACCU-CHEK AVIVA) test strip Use 3 times daily as directed. 100 each 5  . insulin glargine (LANTUS) 100 UNIT/ML injection Inject 0.2 mLs (20 Units total) into the skin at bedtime. 10 mL 2  . isosorbide mononitrate (IMDUR) 30 MG 24 hr tablet Take 0.5 tablets (15 mg total) by mouth daily. (Patient taking differently: Take 15 mg by mouth daily with supper. ) 30 tablet 11  . Lancet Devices (ACCU-CHEK SOFTCLIX) lancets Use as instructed 3 times daily 1 each 5  . lidocaine (XYLOCAINE) 2 % solution Use as directed 10 mLs in the mouth or throat every 6 (six) hours as needed for mouth pain. 100 mL 0  . potassium chloride SA (K-DUR,KLOR-CON) 20 MEQ tablet Take 1 tablet (20 mEq total) by mouth daily. 30 tablet 11  . Vitamin D, Ergocalciferol, (DRISDOL) 50000 UNITS CAPS capsule Take 50,000 Units by mouth every 7 (seven) days. Reported on 01/10/2016. Take on Mondays    . warfarin (COUMADIN) 5 MG tablet  Take 0.5 tablets (2.5 mg total) by mouth one time only at 6 PM. then based on INR Friday. 02/29/2016  0  . furosemide (LASIX) 80 MG tablet Take 1 tablet (80 mg total) by mouth 2 (two) times daily. 60 tablet 3  . metoprolol (LOPRESSOR) 100 MG tablet Take 100 mg by mouth 2 (two) times daily.    . traMADol (ULTRAM) 50 MG tablet Take 1 tablet (50 mg total) by mouth every 8 (eight) hours as needed. 30 tablet 0   No facility-administered medications prior to  visit.    ROS Review of Systems  Constitutional: Negative for activity change and appetite change.  HENT: Negative for sinus pressure and sore throat.   Eyes: Negative for visual disturbance.  Respiratory: Positive for shortness of breath (which is at baseline for him). Negative for cough and chest tightness.   Cardiovascular: Negative for chest pain and leg swelling.  Gastrointestinal: Negative for abdominal pain, diarrhea, constipation and abdominal distention.       Abdominal bloating  Endocrine: Negative.   Genitourinary: Negative for dysuria.  Musculoskeletal: Negative for myalgias and joint swelling.  Skin: Negative for rash.  Allergic/Immunologic: Negative.   Neurological: Negative for weakness, light-headedness and numbness.  Psychiatric/Behavioral: Negative for suicidal ideas and dysphoric mood.    Objective:  BP 137/87 mmHg  Temp(Src) 98 F (36.7 C) (Oral)  Resp 18  Ht 5\' 4"  (1.626 m)  Wt 204 lb 12.8 oz (92.897 kg)  BMI 35.14 kg/m2  SpO2 96%  BP/Weight 03/04/2016 02/27/2016 [id number]  Systolic BP 0000000 Q000111Q -  Diastolic BP 87 [id number] -  Wt. (Lbs) 204.8 204.8 -  BMI 35.14 - 35.14    Wt Readings from Last 3 Encounters:  03/04/16 204 lb 12.8 oz (92.897 kg)  02/27/16 204 lb 12.8 oz (92.897 kg)  02/19/16 200 lb 3.2 oz (90.81 kg)     Physical Exam  Constitutional: He is oriented to person, place, and time. He appears well-developed and well-nourished.  Neck: JVD present.  Cardiovascular: Normal heart sounds and intact distal pulses.  An irregularly irregular rhythm present. Tachycardia present.   No murmur heard. Pulmonary/Chest: Effort normal and breath sounds normal. He has no wheezes. He has no rales. He exhibits no tenderness.  Abdominal: Soft. Bowel sounds are normal. He exhibits distension (not tender). He exhibits no mass. There is no tenderness.  Musculoskeletal: Normal range of motion.  Neurological: He is alert and oriented to person, place, and time.      Assessment & Plan:   1. Type 2 diabetes mellitus with diabetic nephropathy, with long-term current use of insulin (HCC) A1c of 7.3 from 12/2015 Continue Lantus 20 units at bedtime - Glucose (CBG)  2. Gout No acute flares at this time. - traMADol (ULTRAM) 50 MG tablet; Take 1 tablet (50 mg total) by mouth every 8 (eight) hours as needed.  Dispense: 60 tablet; Refill: 0  3. Viral gastroenteritis Resolved  4. Chronic atrial fibrillation with RVR (HCC) Currently anticoagulated with Coumadin. Last INR was 2.2 on 02/29/16 INR is managed by the Coumadin clinic Rate uncontrolled with heart rate going from 120-145 due to noncompliance with metoprolol. We have spoken to the pharmacy to allow the patient a free supply of metoprolol as he states he does not have funds to pick up a refill.  5. Chronic systolic CHF (congestive heart failure) (HCC) EF 40-45% from 2-D echo of 01/2016 Evidence of mild fluid overload-elevated JVD Compliance emphasized. States he has an appointment with the heart failure  clinic in 3 days which I have advised him to keep - furosemide (LASIX) 80 MG tablet; Take 1 tablet (80 mg total) by mouth 2 (two) times daily.  Dispense: 60 tablet; Refill: 3  6. CKD (chronic kidney disease), stage IV ([place]) Recently saw [facility] the kidney. Avoid nephrotoxic agents Will need to manage deteriorating renal function  Vs maintaining euvolemic from CHF standpoint   7. Essential hypertension controlled - metoprolol (LOPRESSOR) 100 MG tablet; Take 1 tablet (100 mg total) by mouth 2 (two) times daily.  Dispense: 60 tablet; Refill: 3  8. Gastroesophageal reflux disease without esophagitis Due to symptoms of abdominal bloating will place on PPI - omeprazole (PRILOSEC) 20 MG capsule; Take 1 capsule (20 mg total) by mouth daily.  Dispense: 30 capsule; Refill: 3   Meds ordered this encounter  Medications  . metoprolol (LOPRESSOR) 100 MG tablet    Sig: Take 1 tablet (100 mg total)  by mouth 2 (two) times daily.    Dispense:  60 tablet    Refill:  3  . traMADol (ULTRAM) 50 MG tablet    Sig: Take 1 tablet (50 mg total) by mouth every 8 (eight) hours as needed.    Dispense:  60 tablet    Refill:  0  . furosemide (LASIX) 80 MG tablet    Sig: Take 1 tablet (80 mg total) by mouth 2 (two) times daily.    Dispense:  60 tablet    Refill:  3  . omeprazole (PRILOSEC) 20 MG capsule    Sig: Take 1 capsule (20 mg total) by mouth daily.    Dispense:  30 capsule    Refill:  3    Follow-up: Return in about 1 week (around 03/11/2016) for TCC- follow up on Afib.   [name] MD

## 2016-03-04 NOTE — Progress Notes (Signed)
Patient's here hospital f/up Afib.  Patient having stomach pain x2wks.  Requesting med refills.

## 2016-03-05 MED FILL — traMADol HCL 50 MG TABS: 50 | 20 days supply | Qty: 60 | Fill #0

## 2016-03-10 ENCOUNTER — Telehealth: Payer: Self-pay

## 2016-03-10 NOTE — Telephone Encounter (Deleted)
Attempted to contact the patient to check on his status and to remind him of his appointment tomorrow at the [facility], 03/11/16 @ 1415. Call placed to # [phone] with the assistance of [place] interpreter # [phone] with [employer].  Unable to reach the patient and a message was left informing the patient who called and a call would be attempted again at a later time.

## 2016-03-10 NOTE — Telephone Encounter (Signed)
Attempted to contact the patient to check on his status and to remind him of his appointment at the [facility] tomorrow. 03/11/16 @ 1415. Call placed to # [phone] with the assistance of [place] interpreter # [phone] with [employer]. Unable to reach the patient and a HIPAA compliant voice mail message was left informing him that a call would be attempted at a later time

## 2016-03-11 ENCOUNTER — Ambulatory Visit: Payer: Medicare Other | Admitting: Family Medicine

## 2016-03-14 ENCOUNTER — Ambulatory Visit (INDEPENDENT_AMBULATORY_CARE_PROVIDER_SITE_OTHER): Payer: Medicare Other | Admitting: *Deleted

## 2016-03-14 ENCOUNTER — Encounter (HOSPITAL_COMMUNITY): Payer: Self-pay | Admitting: Emergency Medicine

## 2016-03-14 ENCOUNTER — Telehealth: Payer: Self-pay

## 2016-03-14 ENCOUNTER — Inpatient Hospital Stay (HOSPITAL_COMMUNITY)
Admission: EM | Admit: 2016-03-14 | Discharge: 2016-03-18 | DRG: 291 | Disposition: A | Discharge status: Home / Self Care | Payer: Medicare Other | Attending: Internal Medicine | Admitting: Internal Medicine

## 2016-03-14 ENCOUNTER — Emergency Department (HOSPITAL_COMMUNITY): Payer: Medicare Other

## 2016-03-14 DIAGNOSIS — D631 Anemia in chronic kidney disease: Secondary | ICD-10-CM | POA: Diagnosis present

## 2016-03-14 DIAGNOSIS — I130 Hypertensive heart and chronic kidney disease with heart failure and stage 1 through stage 4 chronic kidney disease, or unspecified chronic kidney disease: Secondary | ICD-10-CM | POA: Diagnosis not present

## 2016-03-14 DIAGNOSIS — E8770 Fluid overload, unspecified: Secondary | ICD-10-CM | POA: Diagnosis present

## 2016-03-14 DIAGNOSIS — K7460 Unspecified cirrhosis of liver: Secondary | ICD-10-CM | POA: Diagnosis present

## 2016-03-14 DIAGNOSIS — K7682 Hepatic encephalopathy: Secondary | ICD-10-CM | POA: Diagnosis present

## 2016-03-14 DIAGNOSIS — G4733 Obstructive sleep apnea (adult) (pediatric): Secondary | ICD-10-CM | POA: Diagnosis present

## 2016-03-14 DIAGNOSIS — I110 Hypertensive heart disease with heart failure: Secondary | ICD-10-CM | POA: Diagnosis not present

## 2016-03-14 DIAGNOSIS — I4821 Permanent atrial fibrillation: Secondary | ICD-10-CM | POA: Diagnosis present

## 2016-03-14 DIAGNOSIS — I5023 Acute on chronic systolic (congestive) heart failure: Secondary | ICD-10-CM | POA: Diagnosis not present

## 2016-03-14 DIAGNOSIS — Z6831 Body mass index (BMI) 31.0-31.9, adult: Secondary | ICD-10-CM | POA: Diagnosis not present

## 2016-03-14 DIAGNOSIS — Z9119 Patient's noncompliance with other medical treatment and regimen: Secondary | ICD-10-CM

## 2016-03-14 DIAGNOSIS — E1122 Type 2 diabetes mellitus with diabetic chronic kidney disease: Secondary | ICD-10-CM | POA: Diagnosis present

## 2016-03-14 DIAGNOSIS — I079 Rheumatic tricuspid valve disease, unspecified: Secondary | ICD-10-CM | POA: Diagnosis not present

## 2016-03-14 DIAGNOSIS — R000 Tachycardia, unspecified: Secondary | ICD-10-CM | POA: Diagnosis not present

## 2016-03-14 DIAGNOSIS — E8779 Other fluid overload: Secondary | ICD-10-CM | POA: Diagnosis not present

## 2016-03-14 DIAGNOSIS — E118 Type 2 diabetes mellitus with unspecified complications: Secondary | ICD-10-CM

## 2016-03-14 DIAGNOSIS — I2510 Atherosclerotic heart disease of native coronary artery without angina pectoris: Secondary | ICD-10-CM | POA: Diagnosis present

## 2016-03-14 DIAGNOSIS — I059 Rheumatic mitral valve disease, unspecified: Secondary | ICD-10-CM | POA: Diagnosis not present

## 2016-03-14 DIAGNOSIS — K7200 Acute and subacute hepatic failure without coma: Secondary | ICD-10-CM | POA: Diagnosis not present

## 2016-03-14 DIAGNOSIS — I272 Other secondary pulmonary hypertension: Secondary | ICD-10-CM | POA: Diagnosis present

## 2016-03-14 DIAGNOSIS — E1129 Type 2 diabetes mellitus with other diabetic kidney complication: Secondary | ICD-10-CM

## 2016-03-14 DIAGNOSIS — R188 Other ascites: Secondary | ICD-10-CM | POA: Diagnosis present

## 2016-03-14 DIAGNOSIS — I509 Heart failure, unspecified: Secondary | ICD-10-CM | POA: Diagnosis not present

## 2016-03-14 DIAGNOSIS — D638 Anemia in other chronic diseases classified elsewhere: Secondary | ICD-10-CM | POA: Diagnosis present

## 2016-03-14 DIAGNOSIS — Z79899 Other long term (current) drug therapy: Secondary | ICD-10-CM

## 2016-03-14 DIAGNOSIS — Z5181 Encounter for therapeutic drug level monitoring: Secondary | ICD-10-CM | POA: Diagnosis not present

## 2016-03-14 DIAGNOSIS — N184 Chronic kidney disease, stage 4 (severe): Secondary | ICD-10-CM | POA: Diagnosis present

## 2016-03-14 DIAGNOSIS — E669 Obesity, unspecified: Secondary | ICD-10-CM | POA: Diagnosis present

## 2016-03-14 DIAGNOSIS — Z7901 Long term (current) use of anticoagulants: Secondary | ICD-10-CM

## 2016-03-14 DIAGNOSIS — N179 Acute kidney failure, unspecified: Secondary | ICD-10-CM | POA: Diagnosis present

## 2016-03-14 DIAGNOSIS — M109 Gout, unspecified: Secondary | ICD-10-CM | POA: Diagnosis present

## 2016-03-14 DIAGNOSIS — Z87891 Personal history of nicotine dependence: Secondary | ICD-10-CM | POA: Diagnosis not present

## 2016-03-14 DIAGNOSIS — Z794 Long term (current) use of insulin: Secondary | ICD-10-CM | POA: Diagnosis not present

## 2016-03-14 DIAGNOSIS — I482 Chronic atrial fibrillation: Secondary | ICD-10-CM | POA: Diagnosis not present

## 2016-03-14 DIAGNOSIS — I4891 Unspecified atrial fibrillation: Secondary | ICD-10-CM

## 2016-03-14 DIAGNOSIS — R0600 Dyspnea, unspecified: Secondary | ICD-10-CM | POA: Diagnosis not present

## 2016-03-14 DIAGNOSIS — Z953 Presence of xenogenic heart valve: Secondary | ICD-10-CM

## 2016-03-14 DIAGNOSIS — E119 Type 2 diabetes mellitus without complications: Secondary | ICD-10-CM

## 2016-03-14 DIAGNOSIS — R0602 Shortness of breath: Secondary | ICD-10-CM | POA: Diagnosis not present

## 2016-03-14 DIAGNOSIS — IMO0001 Reserved for inherently not codable concepts without codable children: Secondary | ICD-10-CM | POA: Insufficient documentation

## 2016-03-14 DIAGNOSIS — I5021 Acute systolic (congestive) heart failure: Secondary | ICD-10-CM | POA: Diagnosis not present

## 2016-03-14 LAB — CBC
HCT: 30.6 % — ABNORMAL LOW (ref 39.0–52.0)
Hemoglobin: 9.6 g/dL — ABNORMAL LOW (ref 13.0–17.0)
MCH: 21.3 pg — AB (ref 26.0–34.0)
MCHC: 31.4 g/dL (ref 30.0–36.0)
MCV: 67.8 fL — ABNORMAL LOW (ref 78.0–100.0)
Platelets: 314 10*3/uL (ref 150–400)
RBC: 4.51 MIL/uL (ref 4.22–5.81)
RDW: 23.3 % — AB (ref 11.5–15.5)
WBC: 6.6 10*3/uL (ref 4.0–10.5)

## 2016-03-14 LAB — AMMONIA: AMMONIA: 160 umol/L — AB (ref 9–35)

## 2016-03-14 LAB — BASIC METABOLIC PANEL
ANION GAP: 16 — AB (ref 5–15)
BUN: 70 mg/dL — AB (ref 6–20)
CALCIUM: 9 mg/dL (ref 8.9–10.3)
CO2: 16 mmol/L — AB (ref 22–32)
CREATININE: 4.69 mg/dL — AB (ref 0.61–1.24)
Chloride: 106 mmol/L (ref 101–111)
GFR calc Af Amer: 15 mL/min — ABNORMAL LOW (ref >60)
GFR, EST NON AFRICAN AMERICAN: 13 mL/min — AB (ref >60)
GLUCOSE: 122 mg/dL — AB (ref 65–99)
Potassium: 4.9 mmol/L (ref 3.5–5.1)
Sodium: 138 mmol/L (ref 135–145)

## 2016-03-14 LAB — I-STAT TROPONIN, ED: Troponin i, poc: 0.02 ng/mL (ref 0.00–0.08)

## 2016-03-14 LAB — PROTIME-INR
INR: 1.9 — AB (ref 0.00–1.49)
Prothrombin Time: 21.7 seconds — ABNORMAL HIGH (ref 11.6–15.2)

## 2016-03-14 LAB — POCT INR: INR: 1.8

## 2016-03-14 LAB — BRAIN NATRIURETIC PEPTIDE: B NATRIURETIC PEPTIDE 5: 1080.3 pg/mL — AB (ref 0.0–100.0)

## 2016-03-14 MED ORDER — FENTANYL CITRATE (PF) 100 MCG/2ML IJ SOLN: 50.0000 ug | INTRAMUSCULAR | Status: DC | PRN

## 2016-03-14 MED ORDER — FUROSEMIDE 10 MG/ML IJ SOLN
80.0000 mg | Freq: Once | INTRAMUSCULAR | Status: AC
  Administered 2016-03-14: 80 mg via INTRAVENOUS
  Filled 2016-03-14: qty 8

## 2016-03-14 MED ORDER — FENTANYL CITRATE (PF) 100 MCG/2ML IJ SOLN
50.0000 ug | Freq: Once | INTRAMUSCULAR | Status: AC
Start: 2016-03-15 — End: 2016-03-15
  Administered 2016-03-15: 50 ug via INTRAVENOUS
  Filled 2016-03-14: qty 2

## 2016-03-14 NOTE — ED Notes (Addendum)
C/o sob, bilateral lower extremity edema, abd swelling, and "fast HR" since yesterday. Denies chest pain.

## 2016-03-14 NOTE — ED Provider Notes (Signed)
CSN: [id number]     Arrival date & time 03/14/16  2052 History   First MD Initiated Contact with Patient 03/14/16 2249     Chief Complaint  Patient presents with  . Shortness of Breath  . Leg Swelling     (Consider location/radiation/quality/duration/timing/severity/associated sxs/prior Treatment) HPI Comments: 55 year old male with complicated medical history including kidney disease, diabetes, heart failure, atrial fibrillation on Coumadin, valve disease, respiratory failure, cirrhosis presents with worsening leg swelling and shortness of breath. Patient denies any chest pain. No fevers or chills. Patient has general fatigue and is had a few episode of vomiting. Patient says he takes his medicines as distracted. Mild difficulty with history due to language barrier.  Patient is a 55 y.o. male presenting with shortness of breath. The history is provided by the patient.  Shortness of Breath Associated symptoms: no abdominal pain, no chest pain, no fever, no headaches, no neck pain, no rash and no vomiting     Past Medical History  Diagnosis Date  . Coronary artery disease   . Diabetes mellitus without complication ([place])   . Hypertension   . Chronic systolic CHF (congestive heart failure) (HCC)     a. prior EF normal; b. Echo 10/16: Mild LVH, EF 30-35%, mitral valve bioprosthesis present without evidence of stenosis or regurgitation, severe LAE, mild RVE with mild to moderately reduced RVSF, moderate RAE  . 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 H/O tricuspid valve repair 2012  . Paroxysmal atrial fibrillation (HCC)     s/p Maze procedure at time of MVR in 2011  . Diabetes mellitus type 2 in obese ([place])   . Obstructive sleep apnea   . Chronic kidney disease (CKD), stage IV (severe) ([place])   . Anemia of chronic disease   . Pulmonary HTN ([place])   . History of echocardiogram     Echo at [facility] in [facility] 4/12: mod LVH, EF 60-65%, mod LAE, tissue MVR ok, mod TR, mod pulmo  HTN with RVSP 48 mmHg  . Asthma   . GERD (gastroesophageal reflux disease)    Past Surgical History  Procedure Laterality Date  . Cardiac surgery     Family History  Problem Relation Age of Onset  . Heart attack Neg Hx    Social History  Substance Use Topics  . Smoking status: Former Research scientist (life sciences)  . Smokeless tobacco: None  . Alcohol Use: No    Review of Systems  Constitutional: Positive for fatigue. Negative for fever and chills.  HENT: Negative for congestion.   Eyes: Negative for visual disturbance.  Respiratory: Positive for shortness of breath.   Cardiovascular: Positive for leg swelling. Negative for chest pain.  Gastrointestinal: Negative for vomiting and abdominal pain.  Genitourinary: Negative for dysuria and flank pain.  Musculoskeletal: Negative for back pain, neck pain and neck stiffness.  Skin: Negative for rash.  Neurological: Negative for light-headedness and headaches.      Allergies  Review of patient's allergies indicates no known allergies.  Home Medications   Prior to Admission medications   Medication Sig Start Date End Date Taking? Authorizing Provider  acetaminophen (TYLENOL) 325 MG tablet Take 650 mg by mouth every 6 (six) hours as needed for headache (pain). Reported on 01/10/2016    Historical Provider, MD  allopurinol (ZYLOPRIM) 100 MG tablet Take 1 tablet (100 mg total) by mouth 2 (two) times daily. 01/10/16   [name], MD  Blood Glucose Monitoring Suppl (ACCU-CHEK AVIVA) device Use as Instructed 3 times  daily 01/24/16   [name], MD  colchicine 0.6 MG tablet Take 2 tablets (1.2 mg) at the onset of a gout flare, may repeat 1 tablet (0.6 mg) in one hour if symptoms persist. 01/24/16   [name], MD  digoxin (LANOXIN) 0.125 MG tablet Take 1 tablet (0.125 mg total) by mouth daily. 10/17/15   Brett Canales, PA-C  diphenhydramine-acetaminophen (TYLENOL PM) 25-500 MG TABS tablet Take 1 tablet by mouth daily as needed. For sleep    Historical  Provider, MD  ferrous sulfate 325 (65 FE) MG tablet Take 1 tablet (325 mg total) by mouth 2 (two) times daily with a meal. 02/27/16   Lavina Hamman, MD  folic acid (FOLVITE) 1 MG tablet Take 1 tablet (1 mg total) by mouth daily. 02/27/16   Lavina Hamman, MD  furosemide (LASIX) 80 MG tablet Take 1 tablet (80 mg total) by mouth 2 (two) times daily. 03/04/16   [name], MD  glucose blood (ACCU-CHEK AVIVA) test strip Use 3 times daily as directed. 02/04/16   [name], MD  insulin glargine (LANTUS) 100 UNIT/ML injection Inject 0.2 mLs (20 Units total) into the skin at bedtime. 01/10/16   [name], MD  isosorbide mononitrate (IMDUR) 30 MG 24 hr tablet Take 0.5 tablets (15 mg total) by mouth daily. Patient taking differently: Take 15 mg by mouth daily with supper.  10/24/15   Liliane Shi, PA-C  Lancet Devices [facility] lancets Use as instructed 3 times daily 01/24/16   [name], MD  lidocaine (XYLOCAINE) 2 % solution Use as directed 10 mLs in the mouth or throat every 6 (six) hours as needed for mouth pain. 02/12/16   [name], MD  metoprolol (LOPRESSOR) 100 MG tablet Take 1 tablet (100 mg total) by mouth 2 (two) times daily. 03/04/16   [name], MD  omeprazole (PRILOSEC) 20 MG capsule Take 1 capsule (20 mg total) by mouth daily. 03/04/16   [name], MD  potassium chloride SA (K-DUR,KLOR-CON) 20 MEQ tablet Take 1 tablet (20 mEq total) by mouth daily. 10/24/15   Liliane Shi, PA-C  traMADol (ULTRAM) 50 MG tablet Take 1 tablet (50 mg total) by mouth every 8 (eight) hours as needed. 03/04/16   [name], MD  Vitamin D, Ergocalciferol, (DRISDOL) 50000 UNITS CAPS capsule Take 50,000 Units by mouth every 7 (seven) days. Reported on 01/10/2016. Take on Mondays    Historical Provider, MD  warfarin (COUMADIN) 5 MG tablet Take 0.5 tablets (2.5 mg total) by mouth one time only at 6 PM. then based on INR Friday. 02/29/2016 02/28/16   Lavina Hamman, MD   BP 118/90 mmHg  Pulse 60   Temp(Src) 97.4 F (36.3 C) (Oral)  Resp 10  SpO2 96% Physical Exam  Constitutional: He appears well-developed. No distress.  HENT:  Head: Normocephalic and atraumatic.  Eyes: Right eye exhibits no discharge. Left eye exhibits no discharge.  Neck: Normal range of motion. Neck supple. No tracheal deviation present.  Cardiovascular: Normal rate and regular rhythm.   Pulmonary/Chest: No respiratory distress. He has rales (sparse crackles at bases).  Abdominal: Soft. He exhibits no distension. There is no tenderness. There is no guarding.  Musculoskeletal: He exhibits edema (2+ lower extremities extending to pelvic region bilateral chronic skin changes in lower extremities).  Neurological: He is alert. GCS eye subscore is 4. GCS verbal subscore is 4. GCS motor subscore is 6.  Patient initially alert and oriented to name place setting. On  recheck patient general lethargy arousable to loud verbal. Patient moves all extremities with general weakness.  Skin: Skin is warm. No rash noted.  Psychiatric:  Mild sedate, easily arousable to loud verbal  Nursing note and vitals reviewed.   ED Course  Procedures (including critical care time) Labs Review Labs Reviewed  BASIC METABOLIC PANEL - Abnormal; Notable for the following:    CO2 16 (*)    Glucose, Bld 122 (*)    BUN 70 (*)    Creatinine, Ser 4.69 (*)    GFR calc non Af Amer 13 (*)    GFR calc Af Amer 15 (*)    Anion gap 16 (*)    All other components within normal limits  CBC - Abnormal; Notable for the following:    Hemoglobin 9.6 (*)    HCT 30.6 (*)    MCV 67.8 (*)    MCH 21.3 (*)    RDW 23.3 (*)    All other components within normal limits  BRAIN NATRIURETIC PEPTIDE - Abnormal; Notable for the following:    B Natriuretic Peptide 1080.3 (*)    All other components within normal limits  AMMONIA - Abnormal; Notable for the following:    Ammonia 160 (*)    All other components within normal limits  PROTIME-INR - Abnormal; Notable  for the following:    Prothrombin Time 21.7 (*)    INR 1.90 (*)    All other components within normal limits  HEPATIC FUNCTION PANEL  DIGOXIN LEVEL  BLOOD GAS, ARTERIAL  I-STAT TROPOININ, ED    Imaging Review Dg Chest 2 View  03/14/2016  CLINICAL DATA:  Tachycardia and shortness of breath for the past 2 days. EXAM: CHEST  2 VIEW COMPARISON:  Chest x-ray 02/19/2016. FINDINGS: Lung volumes are slightly low. Bibasilar subsegmental atelectasis. No acute consolidative airspace disease. No pleural effusions. Cephalization of the pulmonary vasculature, without frank pulmonary edema. Heart size is moderately enlarged. The patient is rotated to the right on today's exam, resulting in distortion of the mediastinal contours and reduced diagnostic sensitivity and specificity for mediastinal pathology. Atherosclerosis in the thoracic aorta. Status post median sternotomy for mitral valve replacement. IMPRESSION: 1. Cardiomegaly with pulmonary venous congestion, but no frank pulmonary edema. 2. Atherosclerosis. Electronically Signed   By: Vinnie Langton M.D.   On: 03/14/2016 22:10   I have personally reviewed and evaluated these images and lab results as part of my medical decision-making.   EKG Interpretation   Date/Time:  Friday March 14 2016 20:58:04 EDT Ventricular Rate:  102 PR Interval:    QRS Duration: 100 QT Interval:  378 QTC Calculation: 492 R Axis:   -62 Text Interpretation:  Atrial fibrillation with rapid ventricular response  Left axis deviation Incomplete right bundle branch block Abnormal ECG  Confirmed by Amika Tassin  MD, Shania Bjelland ([id number]) on 03/14/2016 10:51:53 PM      MDM   Final diagnoses:  Acute renal failure, unspecified acute renal failure type (HCC)  Dyspnea  Acute congestive heart failure, unspecified congestive heart failure type ([place])  Acute hepatic encephalopathy ([place])    Patient presents with clinical concern for worsening fluid overload likely multifactorial with his  cardiac, liver and kidney disease history. Kidney function worsened significantly since last blood work reported. Plan for admission to telemetry. Lasix and lactulose ordered. Patient had worsening lethargy in the ER. Ammonia significantly elevated. Updated admission to stepdown and up dated triad hospitalist.  The patients results and plan were reviewed and discussed.   Any  x-rays performed were independently reviewed by myself.   Differential diagnosis were considered with the presenting HPI.  Medications  lactulose (CHRONULAC) 10 GM/15ML solution 20 g (not administered)  furosemide (LASIX) injection 80 mg (80 mg Intravenous Given 03/14/16 2353)  fentaNYL (SUBLIMAZE) injection 50 mcg (50 mcg Intravenous Given 03/15/16 0008)    Filed Vitals:   03/14/16 2315 03/14/16 2330 03/15/16 0000 03/15/16 0015  BP: 140/98 125/79 142/98 118/90  Pulse: 52 100 85 60  Temp:      TempSrc:      Resp: 18 14 16 10   SpO2: 94% 100% 98% 96%    Final diagnoses:  Acute renal failure, unspecified acute renal failure type (HCC)  Dyspnea  Acute congestive heart failure, unspecified congestive heart failure type ([place])  Acute hepatic encephalopathy (HCC)    Admission/ observation were discussed with the admitting physician, patient and/or family and they are comfortable with the plan.    Elnora Morrison, MD 03/15/16 [phone]

## 2016-03-14 NOTE — ED Notes (Signed)
RN communicated with main LAB to add on BNP; lab states they will run now

## 2016-03-14 NOTE — Telephone Encounter (Signed)
This Case Manager placed call to patient to check status and to discuss rescheduling Transitional Care appointment with Dr. Jarold Song. Call placed to patient using [employer] (Interpreter [phone]).  Inquired about patient's status; patient indicated he was "doing okay today" and had no complaints or concerns. Discussed need for rescheduling appointment with Dr. Jarold Song and appointment scheduled for 03/25/16 at 1430 with Dr. Jarold Song. Patient also reminded of his appointment today at 1415 at [facility]. Patient verbalized understanding and aware of his upcoming appointment. Patient reminded to continue keeping blood glucose log and bring record to his upcoming appointment with Dr. Jarold Song. Patient indicated he picked up medications (Lasix 80 mg bid, metoprolol 100 mg bid, prilosec 20 mg bid, tramadol 1 tab every 8 hours) from pharmacy and is taking them as prescribed. Patient denied any questions about his medications. Discussed importance of medication compliance, and patient verbalized understanding. No additional needs/concerns identified at this time.

## 2016-03-15 ENCOUNTER — Encounter (HOSPITAL_COMMUNITY): Payer: Self-pay | Admitting: Family Medicine

## 2016-03-15 DIAGNOSIS — I509 Heart failure, unspecified: Secondary | ICD-10-CM | POA: Insufficient documentation

## 2016-03-15 DIAGNOSIS — K7469 Other cirrhosis of liver: Secondary | ICD-10-CM

## 2016-03-15 DIAGNOSIS — IMO0001 Reserved for inherently not codable concepts without codable children: Secondary | ICD-10-CM | POA: Insufficient documentation

## 2016-03-15 DIAGNOSIS — E8770 Fluid overload, unspecified: Secondary | ICD-10-CM

## 2016-03-15 DIAGNOSIS — K7460 Unspecified cirrhosis of liver: Secondary | ICD-10-CM | POA: Diagnosis present

## 2016-03-15 DIAGNOSIS — K7200 Acute and subacute hepatic failure without coma: Secondary | ICD-10-CM | POA: Diagnosis present

## 2016-03-15 DIAGNOSIS — E119 Type 2 diabetes mellitus without complications: Secondary | ICD-10-CM

## 2016-03-15 DIAGNOSIS — Z794 Long term (current) use of insulin: Secondary | ICD-10-CM

## 2016-03-15 DIAGNOSIS — K7682 Hepatic encephalopathy: Secondary | ICD-10-CM | POA: Diagnosis present

## 2016-03-15 LAB — HEPATIC FUNCTION PANEL
ALBUMIN: 3.2 g/dL — AB (ref 3.5–5.0)
ALT: 14 U/L — ABNORMAL LOW (ref 17–63)
AST: 28 U/L (ref 15–41)
Alkaline Phosphatase: 117 U/L (ref 38–126)
BILIRUBIN TOTAL: 1.8 mg/dL — AB (ref 0.3–1.2)
Bilirubin, Direct: 0.9 mg/dL — ABNORMAL HIGH (ref 0.1–0.5)
Indirect Bilirubin: 0.9 mg/dL (ref 0.3–0.9)
Total Protein: 7.8 g/dL (ref 6.5–8.1)

## 2016-03-15 LAB — GLUCOSE, CAPILLARY
GLUCOSE-CAPILLARY: 92 mg/dL (ref 65–99)
GLUCOSE-CAPILLARY: 76 mg/dL (ref 65–99)
Glucose-Capillary: 87 mg/dL (ref 65–99)
Glucose-Capillary: 63 mg/dL — ABNORMAL LOW (ref 65–99)
Glucose-Capillary: 113 mg/dL — ABNORMAL HIGH (ref 65–99)
Glucose-Capillary: 132 mg/dL — ABNORMAL HIGH (ref 65–99)

## 2016-03-15 LAB — I-STAT ARTERIAL BLOOD GAS, ED
ACID-BASE DEFICIT: 7 mmol/L — AB (ref 0.0–2.0)
BICARBONATE: 19.9 meq/L — AB (ref 20.0–24.0)
O2 SAT: 97 %
PO2 ART: 98 mmHg (ref 80.0–100.0)
Patient temperature: 98.6
TCO2: 21 mmol/L (ref 0–100)
pCO2 arterial: 42.5 mmHg (ref 35.0–45.0)
pH, Arterial: 7.279 — ABNORMAL LOW (ref 7.350–7.450)

## 2016-03-15 LAB — DIGOXIN LEVEL: Digoxin Level: <0.2 ng/mL — ABNORMAL LOW (ref 0.8–2.0)

## 2016-03-15 LAB — AMMONIA: Ammonia: 113 umol/L — ABNORMAL HIGH (ref 9–35)

## 2016-03-15 LAB — PROTIME-INR
INR: 1.78 — AB (ref 0.00–1.49)
PROTHROMBIN TIME: 20.7 s — AB (ref 11.6–15.2)

## 2016-03-15 LAB — LACTIC ACID, PLASMA
Lactic Acid, Venous: 1.5 mmol/L (ref 0.5–2.0)
Lactic Acid, Venous: 1.6 mmol/L (ref 0.5–2.0)

## 2016-03-15 LAB — PROCALCITONIN: Procalcitonin: 0.89 ng/mL

## 2016-03-15 MED ORDER — FOLIC ACID 1 MG PO TABS
1.0000 mg | ORAL_TABLET | Freq: Every day | ORAL | Status: DC
  Administered 2016-03-15 – 2016-03-18 (×4): 1 mg via ORAL
  Filled 2016-03-15 (×4): qty 1

## 2016-03-15 MED ORDER — INSULIN ASPART 100 UNIT/ML ~~LOC~~ SOLN: 0.0000 [IU] | Freq: Every day | SUBCUTANEOUS | Status: DC

## 2016-03-15 MED ORDER — MORPHINE SULFATE (PF) 2 MG/ML IV SOLN
1.0000 mg | INTRAVENOUS | Status: DC | PRN
  Administered 2016-03-15: 1 mg via INTRAVENOUS
  Filled 2016-03-15: qty 1

## 2016-03-15 MED ORDER — WARFARIN SODIUM 5 MG PO TABS: 10.0000 mg | ORAL_TABLET | Freq: Once | ORAL | Status: DC

## 2016-03-15 MED ORDER — ALLOPURINOL 100 MG PO TABS
100.0000 mg | ORAL_TABLET | Freq: Two times a day (BID) | ORAL | Status: DC
  Administered 2016-03-15: 100 mg via ORAL
  Filled 2016-03-15: qty 1

## 2016-03-15 MED ORDER — INSULIN GLARGINE 100 UNIT/ML ~~LOC~~ SOLN
10.0000 [IU] | Freq: Every day | SUBCUTANEOUS | Status: DC
  Administered 2016-03-16 – 2016-03-17 (×2): 10 [IU] via SUBCUTANEOUS
  Filled 2016-03-15 (×4): qty 0.1

## 2016-03-15 MED ORDER — DIGOXIN 125 MCG PO TABS
0.1250 mg | ORAL_TABLET | Freq: Every day | ORAL | Status: DC
  Administered 2016-03-15 – 2016-03-16 (×2): 0.125 mg via ORAL
  Filled 2016-03-15 (×2): qty 1

## 2016-03-15 MED ORDER — WARFARIN SODIUM 5 MG PO TABS
10.0000 mg | ORAL_TABLET | Freq: Once | ORAL | Status: AC
  Administered 2016-03-15: 10 mg via ORAL
  Filled 2016-03-15: qty 2

## 2016-03-15 MED ORDER — ONDANSETRON HCL 4 MG/2ML IJ SOLN: 4.0000 mg | Freq: Four times a day (QID) | INTRAMUSCULAR | Status: DC | PRN

## 2016-03-15 MED ORDER — LACTULOSE 10 GM/15ML PO SOLN
20.0000 g | Freq: Three times a day (TID) | ORAL | Status: DC
  Administered 2016-03-15 – 2016-03-18 (×10): 20 g via ORAL
  Filled 2016-03-15 (×10): qty 30

## 2016-03-15 MED ORDER — WARFARIN - PHARMACIST DOSING INPATIENT
Freq: Every day | Status: DC
  Administered 2016-03-15 – 2016-03-17 (×3)

## 2016-03-15 MED ORDER — VITAMIN D (ERGOCALCIFEROL) 1.25 MG (50000 UNIT) PO CAPS
50000.0000 [IU] | ORAL_CAPSULE | ORAL | Status: DC
  Administered 2016-03-17: 50000 [IU] via ORAL
  Filled 2016-03-15: qty 1

## 2016-03-15 MED ORDER — INSULIN GLARGINE 100 UNIT/ML ~~LOC~~ SOLN
15.0000 [IU] | Freq: Every day | SUBCUTANEOUS | Status: DC
  Filled 2016-03-15 (×2): qty 0.15

## 2016-03-15 MED ORDER — ACETAMINOPHEN 325 MG PO TABS: 650.0000 mg | ORAL_TABLET | ORAL | Status: DC | PRN

## 2016-03-15 MED ORDER — INSULIN ASPART 100 UNIT/ML ~~LOC~~ SOLN
0.0000 [IU] | Freq: Three times a day (TID) | SUBCUTANEOUS | Status: DC
  Administered 2016-03-16: 1 [IU] via SUBCUTANEOUS
  Administered 2016-03-16: 2 [IU] via SUBCUTANEOUS
  Administered 2016-03-16: 1 [IU] via SUBCUTANEOUS
  Administered 2016-03-17: 2 [IU] via SUBCUTANEOUS

## 2016-03-15 MED ORDER — TRAMADOL HCL 50 MG PO TABS: 50.0000 mg | ORAL_TABLET | Freq: Three times a day (TID) | ORAL | Status: DC | PRN

## 2016-03-15 MED ORDER — DEXTROSE 50 % IV SOLN
INTRAVENOUS | Status: AC
  Administered 2016-03-15: 25 mL
  Filled 2016-03-15: qty 50

## 2016-03-15 MED ORDER — ISOSORBIDE MONONITRATE ER 30 MG PO TB24
15.0000 mg | ORAL_TABLET | Freq: Every day | ORAL | Status: DC
  Administered 2016-03-15 – 2016-03-17 (×3): 15 mg via ORAL
  Filled 2016-03-15 (×4): qty 1

## 2016-03-15 MED ORDER — SODIUM CHLORIDE 0.9% FLUSH: 3.0000 mL | INTRAVENOUS | Status: DC | PRN

## 2016-03-15 MED ORDER — WARFARIN SODIUM 5 MG PO TABS
7.5000 mg | ORAL_TABLET | Freq: Once | ORAL | Status: AC
  Administered 2016-03-15: 7.5 mg via ORAL
  Filled 2016-03-15: qty 2

## 2016-03-15 MED ORDER — SODIUM CHLORIDE 0.9 % IV SOLN: 250.0000 mL | INTRAVENOUS | Status: DC | PRN

## 2016-03-15 MED ORDER — TRAMADOL HCL 50 MG PO TABS
50.0000 mg | ORAL_TABLET | Freq: Two times a day (BID) | ORAL | Status: DC | PRN
  Administered 2016-03-18 (×2): 50 mg via ORAL
  Filled 2016-03-15 (×2): qty 1

## 2016-03-15 MED ORDER — INSULIN ASPART 100 UNIT/ML ~~LOC~~ SOLN: 0.0000 [IU] | SUBCUTANEOUS | Status: DC

## 2016-03-15 MED ORDER — LACTULOSE 10 GM/15ML PO SOLN
20.0000 g | Freq: Once | ORAL | Status: AC
  Filled 2016-03-15: qty 30

## 2016-03-15 MED ORDER — FUROSEMIDE 10 MG/ML IJ SOLN
120.0000 mg | Freq: Two times a day (BID) | INTRAMUSCULAR | Status: DC
  Administered 2016-03-15 – 2016-03-16 (×3): 120 mg via INTRAVENOUS
  Filled 2016-03-15 (×4): qty 12

## 2016-03-15 MED ORDER — ACETAMINOPHEN 325 MG PO TABS: 650.0000 mg | ORAL_TABLET | Freq: Four times a day (QID) | ORAL | Status: DC | PRN

## 2016-03-15 MED ORDER — MORPHINE SULFATE (PF) 2 MG/ML IV SOLN: 1.0000 mg | INTRAVENOUS | Status: DC | PRN

## 2016-03-15 MED ORDER — SODIUM CHLORIDE 0.9% FLUSH
3.0000 mL | Freq: Two times a day (BID) | INTRAVENOUS | Status: DC
  Administered 2016-03-15: 3 mL via INTRAVENOUS

## 2016-03-15 MED ORDER — METOPROLOL TARTRATE 100 MG PO TABS
100.0000 mg | ORAL_TABLET | Freq: Two times a day (BID) | ORAL | Status: DC
  Administered 2016-03-15 – 2016-03-18 (×6): 100 mg via ORAL
  Filled 2016-03-15 (×6): qty 1

## 2016-03-15 MED ORDER — FERROUS SULFATE 325 (65 FE) MG PO TABS
325.0000 mg | ORAL_TABLET | Freq: Two times a day (BID) | ORAL | Status: DC
  Administered 2016-03-15 – 2016-03-18 (×7): 325 mg via ORAL
  Filled 2016-03-15 (×7): qty 1

## 2016-03-15 NOTE — Progress Notes (Signed)
[place] [facility] TEAM 1 - Stepdown/ICU TEAM PROGRESS NOTE  Kobee Coletta Memos YS:[id number] DOB: Apr 28, 1961 DOA: 03/14/2016 PCP: [name], MD  Admit HPI / Brief Narrative: 55 y.o. male with history of systolic CHF, atrial fibrillation on Coumadin, chronic kidney disease stage IV, coronary artery disease, insulin-dependent diabetes mellitus, and liver cirrhosis who presented to the ED with 2 days of dyspnea, worsening bilateral leg swelling, and palpitations. Patient was admitted to this institution 02/25/2016 to 02/27/2016 and managed for acute viral gastroenteritis. He was discharged home in improved and stable condition, weighing 201 pounds. He saw his primary care physician following the discharge, at which time he endorsed non-adherence to his treatment plan due to inability to afford some of his medications. He had an appointment with the heart failure clinic after d/c, but he did not show up.   Upon arrival to the ED, patient is found to be tachycardic in the 140s, with stable blood pressure. EKG demonstrated atrial fibrillation with RVR. Chest x-ray noted cardiomegaly with pulmonary venous congestion. Creatinine was 4.69, up from 1.83 on 02/27/2016. Hemoglobin 9.6 with MCV of 68, both indices stable relative to prior measurements. INR 1.9, Ammonia level 160.   HPI/Subjective: Pt seen for f/u visit.  Assessment/Plan:  Acute on chronic systolic CHF  -EF [id number] via TTE 02/08/16- baseline wgt ~90kg - net negative ~1.5L thus far  -noncompliance is cause of acute exacerbation  Filed Weights   03/15/16 0110 03/15/16 0252  Weight: 96.3 kg (212 lb 4.9 oz) 96.3 kg (212 lb 4.9 oz)    AKI on CKD stage IV  - SCr 4.69, up from 1.83 on [id number]  - Uncertain etiology, pt appears markedly volume overloaded and renal perfusion may improve following diuresis  - Hepatorenal syndrome is a concern   Recent Labs Lab 03/14/16 2100  CREATININE 4.69*     Idiopathic Cirrhosis with acute hepatic  encephalopathy  - Ammonia 160 on admission  Chronic atrial fibrillation with RVR  - Managed with digoxin at home for rate-control - CHADS-VASc is 2 - on chronic warfarin    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 rate 20, height 5\' 5"  (1.651 m), weight 96.3 kg (212 lb 4.9 oz), SpO2 98 %.  Intake/Output Summary (Last 24 hours) at 03/15/16 1542 Last data filed at 03/15/16 1210  Gross per 24 hour  Intake     33 ml  Output   2000 ml  Net  -1967 ml   Exam: Pt seen for f/u visit.  Data Reviewed: Basic Metabolic Panel:  Recent Labs Lab 03/14/16 2100  NA 138  K 4.9  CL 106  CO2 16*  GLUCOSE 122*  BUN 70*  CREATININE 4.69*  CALCIUM 9.0    CBC:  Recent Labs Lab 03/14/16 2100  WBC 6.6  HGB 9.6*  HCT 30.6*  MCV 67.8*  PLT 314  Liver Function Tests:  Recent Labs Lab 03/14/16 2326  AST 28  ALT 14*  ALKPHOS 117  BILITOT 1.8*  PROT 7.8  ALBUMIN 3.2*    Recent Labs Lab 03/14/16 2326 03/15/16 0500  AMMONIA 160* 113*    Coags:  Recent Labs Lab 03/14/16 1229 03/14/16 2326 03/15/16 1001  INR 1.8 1.90* 1.78*   CBG:  Recent Labs Lab 03/15/16 0415 03/15/16 0737 03/15/16 1200 03/15/16 1502 03/15/16 1538  GLUCAP 87 92 76 63* 113*    Studies:   Recent x-ray studies have been reviewed in detail  by the Attending Physician  Scheduled Meds:  Scheduled Meds: . allopurinol  100 mg Oral BID  . digoxin  0.125 mg Oral Daily  . ferrous sulfate  325 mg Oral BID WC  . folic acid  1 mg Oral Daily  . insulin aspart  0-15 Units Subcutaneous Q4H  . insulin glargine  15 Units Subcutaneous QHS  . isosorbide mononitrate  15 mg Oral Q supper  . lactulose  20 g Oral TID  . sodium chloride flush  3 mL Intravenous Q12H  . [START ON 03/17/2016] Vitamin D (Ergocalciferol)  50,000 Units Oral Q Mon  . warfarin  7.5 mg Oral ONCE-1800  . Warfarin - Pharmacist Dosing Inpatient   Does not apply q1800    Time spent on care of this patient: No charge   Cherene Altes , MD   [facility] Office  [phone] Pager - Text Page per Amion as per below:  On-Call/Text Page:      [email or web address]      password [email or web address]  If 7PM-7AM, please contact night-coverage [email or web address] Password [email or web address] 03/15/2016, 3:42 PM   LOS: 1 day

## 2016-03-15 NOTE — Progress Notes (Signed)
Hypoglycemic Event  CBG: 63   Treatment: D50 IV 25 mL  Symptoms: None  Follow-up CBG: Time:1538 CBG Result:113  Possible Reasons for Event: Inadequate meal intake and Other: Pt NPO, no IV fluids   Comments/MD notified: Dr. Thereasa Solo notified    Lenell Mcconnell Y

## 2016-03-15 NOTE — ED Notes (Signed)
Opyd, MD at bedside 

## 2016-03-15 NOTE — Progress Notes (Addendum)
ANTICOAGULATION CONSULT NOTE - Follow Up Consult  Pharmacy Consult for Warfarin Indication: atrial fibrillation  No Known Allergies  Patient Measurements: Height: 5\' 5"  (165.1 cm) Weight: 212 lb 4.9 oz (96.3 kg) IBW/kg (Calculated) : 61.5  Vital Signs: Temp: 97.9 F (36.6 C) (04/29 0736) Temp Source: Oral (04/29 0736) BP: 123/84 mmHg (04/29 0736) Pulse Rate: 89 (04/29 0935)  Labs:  Recent Labs  03/14/16 1229 03/14/16 2100 03/14/16 2326 03/15/16 1001  HGB  --  9.6*  --   --   HCT  --  30.6*  --   --   PLT  --  314  --   --   LABPROT  --   --  21.7* 20.7*  INR 1.8  --  1.90* 1.78*  CREATININE  --  4.69*  --   --     Estimated Creatinine Clearance: 18.8 mL/min (by C-G formula based on Cr of 4.69).  Assessment: 55 y/o M on warfarin PTA, here with shortness of breath/LE edema, INR is 1.9 on admit, Hgb 9.6, other labs reviewed. Was seen at warfarin clinic 4/28, last dose of warfarin 4/27.  Outpatient warfarin dose is 5 mg Mon/Wed, 7.5 mg all other days  High dose of 10mg  given this 0400 AM - INR falling at 1.78.   Goal of Therapy:  INR 2-3 Monitor platelets by anticoagulation protocol: Yes   Plan:  Warfarin 7.5mg  po x1 tonight.  Daily PT/INR  Sloan Leiter, PharmD, BCPS Clinical Pharmacist [phone]  03/15/2016,11:05 AM

## 2016-03-15 NOTE — Progress Notes (Signed)
ANTICOAGULATION CONSULT NOTE - Initial Consult  Pharmacy Consult for Warfarin  Indication: atrial fibrillation  No Known Allergies   Vital Signs: Temp: 97.4 F (36.3 C) (04/28 2107) Temp Source: Oral (04/28 2107) BP: 135/94 mmHg (04/29 0030) Pulse Rate: 64 (04/29 0030)  Labs:  Recent Labs  03/14/16 1229 03/14/16 2100 03/14/16 2326  HGB  --  9.6*  --   HCT  --  30.6*  --   PLT  --  314  --   LABPROT  --   --  21.7*  INR 1.8  --  1.90*  CREATININE  --  4.69*  --     Estimated Creatinine Clearance: 18.1 mL/min (by C-G formula based on Cr of 4.69).   Medical History: Past Medical History  Diagnosis Date  . Coronary artery disease   . Diabetes mellitus without complication ([place])   . Hypertension   . Chronic systolic CHF (congestive heart failure) (HCC)     a. prior EF normal; b. Echo 10/16: Mild LVH, EF 30-35%, mitral valve bioprosthesis present without evidence of stenosis or regurgitation, severe LAE, mild RVE with mild to moderately reduced RVSF, moderate RAE  . 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 H/O tricuspid valve repair 2012  . Paroxysmal atrial fibrillation (HCC)     s/p Maze procedure at time of MVR in 2011  . Diabetes mellitus type 2 in obese ([place])   . Obstructive sleep apnea   . Chronic kidney disease (CKD), stage IV (severe) ([place])   . Anemia of chronic disease   . Pulmonary HTN ([place])   . History of echocardiogram     Echo at [facility] in [place] 4/12: mod LVH, EF 60-65%, mod LAE, tissue MVR ok, mod TR, mod pulmo HTN with RVSP 48 mmHg  . Asthma   . GERD (gastroesophageal reflux disease)      Assessment: 55 y/o M on warfarin PTA, here with shortness of breath/LE edema, INR is 1.9 on admit, Hgb 9.6, other labs reviewed. Was seen at warfarin clinic 4/28, last dose of warfarin 4/27.   Outpatient warfarin dose is 5 mg Mon/Wed, 7.5 mg all other days  Goal of Therapy:  INR 2-3 Monitor platelets by anticoagulation protocol: Yes   Plan:   -Warfarin 10 mg PO x 1 now (extra 2.5 mg in addition to normal dose) -Daily PT/INR, adjust dose as needed -Monitor for bleeding  Narda Bonds 03/15/2016,1:09 AM

## 2016-03-15 NOTE — H&P (Signed)
History and Physical    Michael [name] YS:[id number] DOB: 1961-08-02 DOA: 03/14/2016  Referring Provider: EDP PCP: [name], MD  Outpatient Specialists: Dr. Harrington Challenger (cardiology)   Patient coming from: Home   Chief Complaint: Dyspnea, bilateral leg and abdominal swelling  HPI: Michael [name] is a medically-complex 55 y.o. male with medical history significant for chronic systolic CHF, chronic atrial fibrillation on Coumadin, chronic kidney disease stage IV, coronary artery disease, insulin-dependent diabetes mellitus, and liver cirrhosis who presents to the ED with 2 days of dyspnea, worsening bilateral leg swelling, and palpitations. Patient was admitted to this institution from 02/25/2016 to 02/27/2016 and managed for acute viral gastroenteritis. He was discharged home in improved and stable condition, weighing 201 pounds. He saw his primary care physician since the recent discharge, at which time he endorsed non-adherence to his treatment plan due to inability to afford some of his medications. He had an appointment with the heart failure clinic since discharge, but unfortunately he did not show up. History is difficult to obtain, despite use of translator services, as there seems to be some confusion present. Family is not currently available to assist.  ED Course: Upon arrival to the ED, patient is found to be afebrile, saturating adequately on room air, tachycardic in the 140s, and with stable blood pressure. EKG demonstrates atrial fibrillation with RVR. Chest x-ray is notable for cardiomegaly with pulmonary venous congestion. CMP exhibits a serum creatinine of 4.69, up from 1.83 on 02/27/2016. CBC demonstrates a hemoglobin of 9.6 with MCV of 68, both indices stable relative to prior measurements. INR is 1.9, troponin 0.02, and BNP 1080. Ammonia level has returned elevated to 160. Patient was treated with Lasix 80 mg IV in the emergency department but has only had 125 cc of urine  output and more than an hour since Lasix was administered. 20 g of lactulose was given by mouth. Patient remains encephalopathic, but heart rate has improved and blood pressure remains stable. He will be admitted to the stepdown unit for ongoing evaluation and management of acute volume overload with acute on chronic renal failure and hepatic encephalopathy.  Review of Systems:  All other systems reviewed and apart from HPI, are negative.  Past Medical History  Diagnosis Date  . Coronary artery disease   . Diabetes mellitus without complication ([place])   . Hypertension   . Chronic systolic CHF (congestive heart failure) (HCC)     a. prior EF normal; b. Echo 10/16: Mild LVH, EF 30-35%, mitral valve bioprosthesis present without evidence of stenosis or regurgitation, severe LAE, mild RVE with mild to moderately reduced RVSF, moderate RAE  . 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 H/O tricuspid valve repair 2012  . Paroxysmal atrial fibrillation (HCC)     s/p Maze procedure at time of MVR in 2011  . Diabetes mellitus type 2 in obese ([place])   . Obstructive sleep apnea   . Chronic kidney disease (CKD), stage IV (severe) ([place])   . Anemia of chronic disease   . Pulmonary HTN ([place])   . History of echocardiogram     Echo at [facility] in [facility] 4/12: mod LVH, EF 60-65%, mod LAE, tissue MVR ok, mod TR, mod pulmo HTN with RVSP 48 mmHg  . Asthma   . GERD (gastroesophageal reflux disease)     Past Surgical History  Procedure Laterality Date  . Cardiac surgery       reports that he has quit smoking. He does not have any smokeless  tobacco history on file. He reports that he does not drink alcohol or use illicit drugs.  No Known Allergies  Family History  Problem Relation Age of Onset  . Heart attack Neg Hx      Prior to Admission medications   Medication Sig Start Date End Date Taking? Authorizing Provider  acetaminophen (TYLENOL) 325 MG tablet Take 650 mg by mouth every 6 (six)  hours as needed for headache (pain). Reported on 01/10/2016    Historical Provider, MD  allopurinol (ZYLOPRIM) 100 MG tablet Take 1 tablet (100 mg total) by mouth 2 (two) times daily. 01/10/16   [name], MD  Blood Glucose Monitoring Suppl (ACCU-CHEK AVIVA) device Use as Instructed 3 times daily 01/24/16   [name], MD  colchicine 0.6 MG tablet Take 2 tablets (1.2 mg) at the onset of a gout flare, may repeat 1 tablet (0.6 mg) in one hour if symptoms persist. 01/24/16   [name], MD  digoxin (LANOXIN) 0.125 MG tablet Take 1 tablet (0.125 mg total) by mouth daily. 10/17/15   Brett Canales, PA-C  diphenhydramine-acetaminophen (TYLENOL PM) 25-500 MG TABS tablet Take 1 tablet by mouth daily as needed. For sleep    Historical Provider, MD  ferrous sulfate 325 (65 FE) MG tablet Take 1 tablet (325 mg total) by mouth 2 (two) times daily with a meal. 02/27/16   Lavina Hamman, MD  folic acid (FOLVITE) 1 MG tablet Take 1 tablet (1 mg total) by mouth daily. 02/27/16   Lavina Hamman, MD  furosemide (LASIX) 80 MG tablet Take 1 tablet (80 mg total) by mouth 2 (two) times daily. 03/04/16   [name], MD  glucose blood (ACCU-CHEK AVIVA) test strip Use 3 times daily as directed. 02/04/16   [name], MD  insulin glargine (LANTUS) 100 UNIT/ML injection Inject 0.2 mLs (20 Units total) into the skin at bedtime. 01/10/16   [name], MD  isosorbide mononitrate (IMDUR) 30 MG 24 hr tablet Take 0.5 tablets (15 mg total) by mouth daily. Patient taking differently: Take 15 mg by mouth daily with supper.  10/24/15   Liliane Shi, PA-C  Lancet Devices [facility]) lancets Use as instructed 3 times daily 01/24/16   [name], MD  lidocaine (XYLOCAINE) 2 % solution Use as directed 10 mLs in the mouth or throat every 6 (six) hours as needed for mouth pain. 02/12/16   [name], MD  metoprolol (LOPRESSOR) 100 MG tablet Take 1 tablet (100 mg total) by mouth 2 (two) times daily. 03/04/16   [name], MD    omeprazole (PRILOSEC) 20 MG capsule Take 1 capsule (20 mg total) by mouth daily. 03/04/16   [name], MD  potassium chloride SA (K-DUR,KLOR-CON) 20 MEQ tablet Take 1 tablet (20 mEq total) by mouth daily. 10/24/15   Liliane Shi, PA-C  traMADol (ULTRAM) 50 MG tablet Take 1 tablet (50 mg total) by mouth every 8 (eight) hours as needed. 03/04/16   [name], MD  Vitamin D, Ergocalciferol, (DRISDOL) 50000 UNITS CAPS capsule Take 50,000 Units by mouth every 7 (seven) days. Reported on 01/10/2016. Take on Mondays    Historical Provider, MD  warfarin (COUMADIN) 5 MG tablet Take 0.5 tablets (2.5 mg total) by mouth one time only at 6 PM. then based on INR Friday. 02/29/2016 02/28/16   Lavina Hamman, MD    Physical Exam: Filed Vitals:   03/14/16 2330 03/15/16 0000 03/15/16 0015 03/15/16 0030  BP: 125/79 142/98 118/90 135/94  Pulse: 100 85 60 64  Temp:      TempSrc:      Resp: 14 16 10 15   SpO2: 100% 98% 96% 97%      Constitutional: NAD, calm, lethargic Eyes: PERTLA, lids and conjunctivae normal ENMT: Mucous membranes are moist. Posterior pharynx clear of any exudate or lesions.   Neck: normal, supple, no masses, no thyromegaly Respiratory: clear to auscultation bilaterally, no wheezing, no crackles. Normal respiratory effort.   Cardiovascular: Rate ~120 and irregular, grade III systolic murmur at LSB Abdomen:  Mild tenderness lower quadrants, no distension, no masses palpated. Bowel sounds normal.  Musculoskeletal: no clubbing / cyanosis. Bilateral LE edema to thighs Skin: no rashes, lesions, ulcers. No induration Neurologic: CN 2-12 grossly intact. Lethargic, falling asleep during interview  Psychiatric:  Difficult to assess given the clinical scenario.     Labs on Admission: I have personally reviewed following labs and imaging studies  CBC:  Recent Labs Lab 03/14/16 2100  WBC 6.6  HGB 9.6*  HCT 30.6*  MCV 67.8*  PLT [id number]   Basic Metabolic Panel:  Recent Labs Lab  03/14/16 2100  NA 138  K 4.9  CL 106  CO2 16*  GLUCOSE 122*  BUN 70*  CREATININE 4.69*  CALCIUM 9.0   GFR: Estimated Creatinine Clearance: 18.1 mL/min (by C-G formula based on Cr of 4.69). Liver Function Tests:  Recent Labs Lab 03/14/16 2326  AST 28  ALT 14*  ALKPHOS 117  BILITOT 1.8*  PROT 7.8  ALBUMIN 3.2*   No results for input(s): LIPASE, AMYLASE in the last 168 hours.  Recent Labs Lab 03/14/16 2326  AMMONIA 160*   Coagulation Profile:  Recent Labs Lab 03/14/16 1229 03/14/16 2326  INR 1.8 1.90*   Cardiac Enzymes: No results for input(s): CKTOTAL, CKMB, CKMBINDEX, TROPONINI in the last 168 hours. BNP (last 3 results) No results for input(s): PROBNP in the last 8760 hours. HbA1C: No results for input(s): HGBA1C in the last 72 hours. CBG: No results for input(s): GLUCAP in the last 168 hours. Lipid Profile: No results for input(s): CHOL, HDL, LDLCALC, TRIG, CHOLHDL, LDLDIRECT in the last 72 hours. Thyroid Function Tests: No results for input(s): TSH, T4TOTAL, FREET4, T3FREE, THYROIDAB in the last 72 hours. Anemia Panel: No results for input(s): VITAMINB12, FOLATE, FERRITIN, TIBC, IRON, RETICCTPCT in the last 72 hours. Urine analysis:    Component Value Date/Time   COLORURINE YELLOW 02/25/2016 1026   APPEARANCEUR CLEAR 02/25/2016 1026   LABSPEC 1.009 02/25/2016 1026   PHURINE 7.5 02/25/2016 1026   GLUCOSEU NEGATIVE 02/25/2016 1026   HGBUR MODERATE* 02/25/2016 1026   BILIRUBINUR NEGATIVE 02/25/2016 1026   KETONESUR NEGATIVE 02/25/2016 1026   PROTEINUR 100* 02/25/2016 1026   NITRITE NEGATIVE 02/25/2016 1026   LEUKOCYTESUR NEGATIVE 02/25/2016 1026   Sepsis Labs: @LABRCNTIP (procalcitonin:4,lacticidven:4) )No results found for this or any previous visit (from the past 240 hour(s)).   Radiological Exams on Admission: Dg Chest 2 View  03/14/2016  CLINICAL DATA:  Tachycardia and shortness of breath for the past 2 days. EXAM: CHEST  2 VIEW COMPARISON:   Chest x-ray 02/19/2016. FINDINGS: Lung volumes are slightly low. Bibasilar subsegmental atelectasis. No acute consolidative airspace disease. No pleural effusions. Cephalization of the pulmonary vasculature, without frank pulmonary edema. Heart size is moderately enlarged. The patient is rotated to the right on today's exam, resulting in distortion of the mediastinal contours and reduced diagnostic sensitivity and specificity for mediastinal pathology. Atherosclerosis in the thoracic aorta. Status post median sternotomy for  mitral valve replacement. IMPRESSION: 1. Cardiomegaly with pulmonary venous congestion, but no frank pulmonary edema. 2. Atherosclerosis. Electronically Signed   By: Vinnie Langton M.D.   On: 03/14/2016 22:10    EKG: Independently reviewed. Atrial fibrillation with RVR (rate 102), LAD, incomplete RBBB   Assessment/Plan  1. Acute on chronic systolic CHF  - BNP is [id number] on arrival, LEs markedly edematous, and CXR with pulm venous congestion, but no frank edema  - Lasix 80 mg IV given x1 in ED with poor response  - Diuresis will be complicated by AKI on CKD IV and may require albumin prior to Lasix +/- inotropic support  - As pt is not in any respiratory distress, and given his precarious renal fxn, will hold off on further diuresis attempts for now  - SLIV, follow strict I/Os, daily wts, fluid-restrict diet   2. AKI on CKD stage IV  - SCr 4.69, up from 1.83 on [id number]  - Uncertain etiology, pt appears markedly volume overloaded and renal perfusion may improve following diuresis  - Hepatorenal syndrome is a concern   - Avoiding nephrotoxins where possible  - May require albumin and inotrope with diuresis    3. Cirrhosis with acute hepatic encephalopathy  - Uncertain etiology of cirrhosis  - No endoscopy report on file  - Past imaging has demonstrated small vol ascites  - Ammonia is 160 on admission, accounting for acute encephalopathy  - Lactulose 15 g given in ED, will  continue with 20 g TID, titrated to achieve 3 loose stools/day  - Monitor    4. Chronic atrial fibrillation with RVR  - In atrial fib with rate in 140s on arrival; improved to 90s-low 100s - Managed with digoxin at home for rate-control, will continue  - CHADS-VASc is at least 2 (CHF, DM)  - On AC with warfarin, INR subtherapeutic at 1.9 on admission  - Continue AC with warfarin, pharmacy asked to dose   - Monitor on telemetry   5. Type II DM  - Managed with Lantus 20 units qHS at home  - Start with Lantus 15 units qHs and moderate-intensity sliding-scale correctional while inpt  - Check CBG q4h and adjust insulin regimen prn  - A1c was 7.3% in February 2017, reflecting sub-optimal control  - Carb-modified diet when appropriate    6. Microcytic anemia  - Hgb 9.6 on admission with MCV 67.8  - Stable relative to prior measurements and no sign of active blood loss  - Likely secondary to CKD, chronic disease; thalassemia may play a role given MCV  - Monitor    DVT prophylaxis: Warfarin  Code Status: Full  Family Communication: None available  Disposition Plan: Admit to stepdown  Consults called: None  Admission status: Inpatient    Vianne Bulls MD [facility] Pager [phone]  If 7PM-7AM, please contact night-coverage [email or web address] Password [email or web address]  03/15/2016, 1:08 AM

## 2016-03-16 DIAGNOSIS — I5021 Acute systolic (congestive) heart failure: Secondary | ICD-10-CM

## 2016-03-16 DIAGNOSIS — E8779 Other fluid overload: Secondary | ICD-10-CM

## 2016-03-16 DIAGNOSIS — E1121 Type 2 diabetes mellitus with diabetic nephropathy: Secondary | ICD-10-CM

## 2016-03-16 LAB — COMPREHENSIVE METABOLIC PANEL
ALK PHOS: 103 U/L (ref 38–126)
ALT: 14 U/L — ABNORMAL LOW (ref 17–63)
ANION GAP: 11 (ref 5–15)
AST: 25 U/L (ref 15–41)
Albumin: 2.8 g/dL — ABNORMAL LOW (ref 3.5–5.0)
BILIRUBIN TOTAL: 1.9 mg/dL — AB (ref 0.3–1.2)
BUN: 63 mg/dL — ABNORMAL HIGH (ref 6–20)
CALCIUM: 9.1 mg/dL (ref 8.9–10.3)
CO2: 22 mmol/L (ref 22–32)
Chloride: 104 mmol/L (ref 101–111)
Creatinine, Ser: 3.38 mg/dL — ABNORMAL HIGH (ref 0.61–1.24)
GFR calc non Af Amer: 19 mL/min — ABNORMAL LOW (ref >60)
GFR, EST AFRICAN AMERICAN: 22 mL/min — AB (ref >60)
GLUCOSE: 158 mg/dL — AB (ref 65–99)
Potassium: 3.8 mmol/L (ref 3.5–5.1)
Sodium: 137 mmol/L (ref 135–145)
TOTAL PROTEIN: 7.3 g/dL (ref 6.5–8.1)

## 2016-03-16 LAB — PROTIME-INR
INR: 2.2 — ABNORMAL HIGH (ref 0.00–1.49)
Prothrombin Time: 24.2 seconds — ABNORMAL HIGH (ref 11.6–15.2)

## 2016-03-16 LAB — GLUCOSE, CAPILLARY
GLUCOSE-CAPILLARY: 159 mg/dL — AB (ref 65–99)
GLUCOSE-CAPILLARY: 122 mg/dL — AB (ref 65–99)
GLUCOSE-CAPILLARY: 140 mg/dL — AB (ref 65–99)
Glucose-Capillary: 130 mg/dL — ABNORMAL HIGH (ref 65–99)

## 2016-03-16 LAB — CBC
HCT: 31.9 % — ABNORMAL LOW (ref 39.0–52.0)
HEMOGLOBIN: 10.1 g/dL — AB (ref 13.0–17.0)
MCH: 22.1 pg — ABNORMAL LOW (ref 26.0–34.0)
MCHC: 31.7 g/dL (ref 30.0–36.0)
MCV: 69.7 fL — ABNORMAL LOW (ref 78.0–100.0)
Platelets: 345 10*3/uL (ref 150–400)
RBC: 4.58 MIL/uL (ref 4.22–5.81)
RDW: 23.6 % — ABNORMAL HIGH (ref 11.5–15.5)
WBC: 6 10*3/uL (ref 4.0–10.5)

## 2016-03-16 LAB — AMMONIA: AMMONIA: 88 umol/L — AB (ref 9–35)

## 2016-03-16 MED ORDER — FUROSEMIDE 10 MG/ML IJ SOLN
80.0000 mg | Freq: Two times a day (BID) | INTRAMUSCULAR | Status: DC
  Administered 2016-03-17 – 2016-03-18 (×3): 80 mg via INTRAVENOUS
  Filled 2016-03-16 (×3): qty 8

## 2016-03-16 MED ORDER — ACETAMINOPHEN 500 MG PO TABS: 500.0000 mg | ORAL_TABLET | ORAL | Status: DC | PRN

## 2016-03-16 MED ORDER — WARFARIN SODIUM 5 MG PO TABS: 5.0000 mg | ORAL_TABLET | ORAL | Status: DC

## 2016-03-16 MED ORDER — DIGOXIN 125 MCG PO TABS
0.0625 mg | ORAL_TABLET | Freq: Every day | ORAL | Status: DC
  Administered 2016-03-17 – 2016-03-18 (×2): 0.0625 mg via ORAL
  Filled 2016-03-16 (×2): qty 1

## 2016-03-16 MED ORDER — WARFARIN SODIUM 7.5 MG PO TABS
7.5000 mg | ORAL_TABLET | ORAL | Status: DC
  Administered 2016-03-16: 7.5 mg via ORAL
  Filled 2016-03-16: qty 2
  Filled 2016-03-16: qty 1.5

## 2016-03-16 NOTE — Progress Notes (Signed)
Pt had bowel movement, soft green but had small amount of bright red blood on top.

## 2016-03-16 NOTE — Progress Notes (Signed)
[facility] TEAM 1 - Stepdown/ICU TEAM PROGRESS NOTE  Michael [name] OP:[id number] DOB: [birth year] DOA: 03/14/2016 PCP: [name], MD  Admit HPI / Brief Narrative: 55 y.o. male with history of systolic CHF, atrial fibrillation on Coumadin, chronic kidney disease stage IV, coronary artery disease, insulin-dependent diabetes mellitus, and liver cirrhosis who presented to the ED with 2 days of dyspnea, worsening bilateral leg swelling, and palpitations. Patient was admitted to [facility] 02/25/2016 to 02/27/2016 and managed for acute viral gastroenteritis. He was discharged home in improved and stable condition, weighing 201 pounds. He saw his primary care physician following the discharge, at which time he endorsed non-adherence to his treatment plan due to inability to afford some of his medications. He had an appointment with the heart failure clinic after d/c, but he did not show up.   Upon arrival to the ED, patient is found to be tachycardic in the 140s, with stable blood pressure. EKG demonstrated atrial fibrillation with RVR. Chest x-ray noted cardiomegaly with pulmonary venous congestion. Creatinine was 4.69, up from 1.83 on 02/27/2016. Hemoglobin 9.6 with MCV of 68, both indices stable relative to prior measurements. INR 1.9, Ammonia level 160.   HPI/Subjective: The patient states he is feeling much better with significantly decreased swelling in his legs and abdomen.  He feels that his breathing is improved today.  He denies chest pain nausea vomiting or abdominal pain.  Assessment/Plan:  Acute on chronic systolic CHF  -EF [id number] via TTE 02/08/16- baseline wgt ~90kg - net negative ~6L thus far  -noncompliance is cause of acute exacerbation - improving but still has a significant volume to be removed   Filed Weights   03/15/16 0110 03/15/16 0252 03/16/16 0500  Weight: 96.3 kg (212 lb 4.9 oz) 96.3 kg (212 lb 4.9 oz) 95.8 kg (211 lb 3.2 oz)    AKI on CKD stage IV  - SCr 4.69, up from  1.83 on 02/27/16  - improving w/ diuresis - follow    Recent Labs Lab 03/14/16 2100 03/16/16 0649  CREATININE 4.69* 3.38*     Idiopathic Cirrhosis with acute hepatic encephalopathy  - Ammonia steadily improving - having regular bowel movements on lactulose - follow   Recent Labs Lab 03/14/16 2326 03/15/16 0500 03/16/16 0649  AMMONIA 160* 113* 88*    Chronic atrial fibrillation with RVR  - Managed with digoxin at home for rate-control - rate now much better controlled  - CHADS-VASc is 2 - on chronic warfarin    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 2012 at [facility] in [place] - followed by [place], w/ TTE 02/08/2016 - no evidence of acute issues   DM2 CBG currently well controlled   Microcytic anemia  - Hgb 9.6 on admission with MCV 67.8  - Stable relative to prior measurements   Recent Labs Lab 03/14/16 2100 03/16/16 0649  HGB 9.6* 10.1*   Gout  Code Status: FULL Family Communication: no family present at time of exam Disposition Plan: Stable for transfer to CHF floor - ambulate - up to chair   Consultants: none  Procedures: none  Antibiotics: none  DVT prophylaxis: warfarin  Objective: Blood pressure 116/88, pulse 42, temperature 98.1 F (36.7 C), temperature source Oral, resp. rate 18, height 5\' 5"  (1.651 m), weight 95.8 kg (211 lb 3.2 oz), SpO2 98 %.  Intake/Output Summary (Last 24 hours) at 03/16/16 1713 Last data filed at 03/16/16 1506  Gross per 24 hour  Intake   2102  ml  Output   5625 ml  Net  -3523 ml   Exam: General: No acute respiratory distress - alert and conversant - sclera icteric  Lungs: Clear to auscultation bilaterally with exception to mild bibasilar crackles  Cardiovascular: Regular rate without gallop or rub normal S1 and S2 Abdomen: Nontender, distended, soft, bowel sounds positive, no rebound Extremities: No significant cyanosis, clubbing, or edema  bilateral lower extremities   Data Reviewed: Basic Metabolic Panel:  Recent Labs Lab 03/14/16 2100 03/16/16 0649  NA 138 137  K 4.9 3.8  CL 106 104  CO2 16* 22  GLUCOSE 122* 158*  BUN 70* 63*  CREATININE 4.69* 3.38*  CALCIUM 9.0 9.1    CBC:  Recent Labs Lab 03/14/16 2100 03/16/16 0649  WBC 6.6 6.0  HGB 9.6* 10.1*  HCT 30.6* 31.9*  MCV 67.8* 69.7*  PLT 314 345    Liver Function Tests:  Recent Labs Lab 03/14/16 2326 03/16/16 0649  AST 28 25  ALT 14* 14*  ALKPHOS 117 103  BILITOT 1.8* 1.9*  PROT 7.8 7.3  ALBUMIN 3.2* 2.8*    Recent Labs Lab 03/14/16 2326 03/15/16 0500 03/16/16 0649  AMMONIA 160* 113* 88*    Coags:  Recent Labs Lab 03/14/16 1229 03/14/16 2326 03/15/16 1001 03/16/16 0649  INR 1.8 1.90* 1.78* 2.20*   CBG:  Recent Labs Lab 03/15/16 1538 03/15/16 2131 03/16/16 0736 03/16/16 1213 03/16/16 1705  GLUCAP 113* 132* 159* 130* 122*    Studies:   Recent x-ray studies have been reviewed in detail by the Attending Physician  Scheduled Meds:  Scheduled Meds: . digoxin  0.125 mg Oral Daily  . ferrous sulfate  325 mg Oral BID WC  . folic acid  1 mg Oral Daily  . furosemide  120 mg Intravenous Q12H  . insulin aspart  0-5 Units Subcutaneous QHS  . insulin aspart  0-9 Units Subcutaneous TID WC  . insulin glargine  10 Units Subcutaneous QHS  . isosorbide mononitrate  15 mg Oral Q supper  . lactulose  20 g Oral TID  . metoprolol  100 mg Oral BID  . [START ON 03/17/2016] Vitamin D (Ergocalciferol)  50,000 Units Oral Q Mon  . [START ON 03/17/2016] warfarin  5 mg Oral Once per day on Mon Wed  . warfarin  7.5 mg Oral Once per day on Sun Tue Thu Fri Sat  . Warfarin - Pharmacist Dosing Inpatient   Does not apply q1800    Time spent on care of this patient: 26mins  Cherene Altes , MD   [facility] Office  [phone] Pager - Text Page per Amion as per below:  On-Call/Text Page:      [email or web address]      password  [email or web address]  If 7PM-7AM, please contact night-coverage [email or web address] Password [email or web address] 03/16/2016, 5:13 PM   LOS: 2 days

## 2016-03-16 NOTE — Progress Notes (Signed)
Pharmacist Heart Failure Core Measure Documentation  Assessment: Michael [name] has an EF documented as 40-45 on 02/08/16 by TTE.  Rationale: Heart failure patients with left ventricular systolic dysfunction (LVSD) and an EF < 40% should be prescribed an angiotensin converting enzyme inhibitor (ACEI) or angiotensin receptor blocker (ARB) at discharge unless a contraindication is documented in the medical record.  This patient is not currently on an ACEI or ARB for HF.  This note is being placed in the record in order to provide documentation that a contraindication to the use of these agents is present for this encounter.  ACE Inhibitor or Angiotensin Receptor Blocker is contraindicated (specify all that apply)  []   ACEI allergy AND ARB allergy []   Angioedema []   Moderate or severe aortic stenosis []   Hyperkalemia []   Hypotension []   Renal artery stenosis [x]   Worsening renal function, preexisting renal disease or dysfunction   Brain Hilts 03/16/2016 11:23 AM

## 2016-03-16 NOTE — Progress Notes (Signed)
ANTICOAGULATION CONSULT NOTE - Follow Up Consult  Pharmacy Consult for Warfarin Indication: atrial fibrillation  No Known Allergies  Patient Measurements: Height: 5\' 5"  (165.1 cm) Weight: 211 lb 3.2 oz (95.8 kg) IBW/kg (Calculated) : 61.5  Vital Signs: Temp: 98.2 F (36.8 C) (04/30 0717) Temp Source: Oral (04/30 0717) BP: 125/99 mmHg (04/30 0717) Pulse Rate: 104 (04/30 0717)  Labs:  Recent Labs  03/14/16 2100 03/14/16 2326 03/15/16 1001 03/16/16 0649  HGB 9.6*  --   --  10.1*  HCT 30.6*  --   --  31.9*  PLT 314  --   --  PENDING  LABPROT  --  21.7* 20.7* 24.2*  INR  --  1.90* 1.78* 2.20*  CREATININE 4.69*  --   --   --     Estimated Creatinine Clearance: 18.7 mL/min (by C-G formula based on Cr of 4.69).  Assessment: 55 y/o M on warfarin PTA, here with shortness of breath/LE edema, INR is 1.9 on admit, Hgb 9.6, other labs reviewed. Was seen at warfarin clinic 4/28, last dose of warfarin 4/27.  Outpatient warfarin dose is 5 mg Mon/Wed, 7.5 mg all other days  High dose of 10mg  given 4/29 AM. INR is therapeutic at 2.2 this AM. CBC is stable. No bleeding reported.   Goal of Therapy:  INR 2-3 Monitor platelets by anticoagulation protocol: Yes   Plan:  Resume home dose of 7.5mg  daily except 5mg  on Mondays and Wednesdays.  Daily PT/INR  Sloan Leiter, PharmD, BCPS Clinical Pharmacist [phone]  03/16/2016,7:57 AM

## 2016-03-17 DIAGNOSIS — I5023 Acute on chronic systolic (congestive) heart failure: Secondary | ICD-10-CM

## 2016-03-17 DIAGNOSIS — K7200 Acute and subacute hepatic failure without coma: Secondary | ICD-10-CM

## 2016-03-17 DIAGNOSIS — I482 Chronic atrial fibrillation: Secondary | ICD-10-CM

## 2016-03-17 DIAGNOSIS — D638 Anemia in other chronic diseases classified elsewhere: Secondary | ICD-10-CM

## 2016-03-17 LAB — GLUCOSE, CAPILLARY
GLUCOSE-CAPILLARY: 98 mg/dL (ref 65–99)
GLUCOSE-CAPILLARY: 164 mg/dL — AB (ref 65–99)
GLUCOSE-CAPILLARY: 107 mg/dL — AB (ref 65–99)

## 2016-03-17 LAB — COMPREHENSIVE METABOLIC PANEL
ALK PHOS: 106 U/L (ref 38–126)
ALT: 14 U/L — AB (ref 17–63)
ANION GAP: 11 (ref 5–15)
AST: 26 U/L (ref 15–41)
Albumin: 2.9 g/dL — ABNORMAL LOW (ref 3.5–5.0)
BILIRUBIN TOTAL: 1.8 mg/dL — AB (ref 0.3–1.2)
BUN: 49 mg/dL — ABNORMAL HIGH (ref 6–20)
CALCIUM: 9.6 mg/dL (ref 8.9–10.3)
CO2: 29 mmol/L (ref 22–32)
CREATININE: 2.3 mg/dL — AB (ref 0.61–1.24)
Chloride: 101 mmol/L (ref 101–111)
GFR, EST AFRICAN AMERICAN: 35 mL/min — AB (ref >60)
GFR, EST NON AFRICAN AMERICAN: 30 mL/min — AB (ref >60)
Glucose, Bld: 113 mg/dL — ABNORMAL HIGH (ref 65–99)
Potassium: 3.4 mmol/L — ABNORMAL LOW (ref 3.5–5.1)
Sodium: 141 mmol/L (ref 135–145)
TOTAL PROTEIN: 7.2 g/dL (ref 6.5–8.1)

## 2016-03-17 LAB — HEMOGLOBIN A1C
Hgb A1c MFr Bld: 6.3 % — ABNORMAL HIGH (ref 4.8–5.6)
MEAN PLASMA GLUCOSE: 134 mg/dL

## 2016-03-17 LAB — PROTIME-INR
INR: 2.73 — AB (ref 0.00–1.49)
PROTHROMBIN TIME: 28.5 s — AB (ref 11.6–15.2)

## 2016-03-17 LAB — CBC
HCT: 31.7 % — ABNORMAL LOW (ref 39.0–52.0)
HEMATOCRIT: 32.6 % — AB (ref 39.0–52.0)
HEMOGLOBIN: 10.1 g/dL — AB (ref 13.0–17.0)
Hemoglobin: 10.2 g/dL — ABNORMAL LOW (ref 13.0–17.0)
MCH: 22 pg — AB (ref 26.0–34.0)
MCH: 21.2 pg — ABNORMAL LOW (ref 26.0–34.0)
MCHC: 31.9 g/dL (ref 30.0–36.0)
MCHC: 31.3 g/dL (ref 30.0–36.0)
MCV: 69.1 fL — AB (ref 78.0–100.0)
MCV: 67.8 fL — ABNORMAL LOW (ref 78.0–100.0)
PLATELETS: 347 10*3/uL (ref 150–400)
Platelets: 343 10*3/uL (ref 150–400)
RBC: 4.59 MIL/uL (ref 4.22–5.81)
RBC: 4.81 MIL/uL (ref 4.22–5.81)
RDW: 23.3 % — ABNORMAL HIGH (ref 11.5–15.5)
RDW: 22.9 % — AB (ref 11.5–15.5)
WBC: 6.5 10*3/uL (ref 4.0–10.5)
WBC: 5.8 10*3/uL (ref 4.0–10.5)

## 2016-03-17 LAB — AMMONIA: AMMONIA: 51 umol/L — AB (ref 9–35)

## 2016-03-17 MED ORDER — WARFARIN SODIUM 2.5 MG PO TABS
2.5000 mg | ORAL_TABLET | Freq: Once | ORAL | Status: AC
  Administered 2016-03-17: 2.5 mg via ORAL
  Filled 2016-03-17: qty 1

## 2016-03-17 MED ORDER — POTASSIUM CHLORIDE CRYS ER 20 MEQ PO TBCR
20.0000 meq | EXTENDED_RELEASE_TABLET | Freq: Once | ORAL | Status: AC
  Administered 2016-03-17: 20 meq via ORAL
  Filled 2016-03-17: qty 1

## 2016-03-17 NOTE — Progress Notes (Signed)
ANTICOAGULATION CONSULT NOTE - Follow Up Consult  Pharmacy Consult for Warfarin Indication: atrial fibrillation  No Known Allergies  Patient Measurements: Height: 5\' 5"  (165.1 cm) Weight: 190 lb 8 oz (86.41 kg) (Scale C) IBW/kg (Calculated) : 61.5  Vital Signs: Temp: 97.6 F (36.4 C) (05/01 0508) Temp Source: Oral (05/01 0508) BP: 112/59 mmHg (05/01 0508) Pulse Rate: 86 (05/01 0508)  Labs:  Recent Labs  03/14/16 2100  03/15/16 1001 03/16/16 0649 03/17/16 0327  HGB 9.6*  --   --  10.1* 10.1*  HCT 30.6*  --   --  31.9* 31.7*  PLT 314  --   --  345 343  LABPROT  --   < > 20.7* 24.2* 28.5*  INR  --   < > 1.78* 2.20* 2.73*  CREATININE 4.69*  --   --  3.38* 2.30*  < > = values in this interval not displayed.  Estimated Creatinine Clearance: 36.3 mL/min (by C-G formula based on Cr of 2.3).  Assessment: 55 y/o M on warfarin PTA, here with shortness of breath/LE edema, INR is 1.9 on admit, Hgb 9.6, other labs reviewed. Was seen at warfarin clinic 4/28, last dose of warfarin 4/27.  Outpatient warfarin dose is 5 mg Mon/Wed, 7.5 mg all other days  High dose of 10mg  given 4/29 AM. INR is therapeutic at 2.73 (trending up quickly this AM).   CBC is stable. No bleeding reported.   Goal of Therapy:  INR 2-3 Monitor platelets by anticoagulation protocol: Yes   Plan:  Coumadin 2.5 mg po x 1 tonight Daily INR  Thank you Anette Guarneri, PharmD [phone] 03/17/2016,10:04 AM

## 2016-03-17 NOTE — Progress Notes (Addendum)
Patient ID: Michael [name], male   DOB: 19-Feb-1961, 55 y.o.   MRN: [id number]  PROGRESS NOTE    Michael [name]  [id number] DOB: [birth year] DOA: 03/14/2016  PCP: [name], MD  Outpatient Specialists:   Brief Narrative:  55 year old male with past medical history significant for systolic CHF, atrial fibrillation on anticoagulation with Coumadin, chronic kidney disease stage IV, chronic artery disease, diabetes mellitus on insulin, liver cirrhosis. Patient presented to [facility] 03/14/2016 with worsening shortness of breath, lower extremity swelling and palpitations for 2 days prior to the admission. Patient was hospitalized earlier in April, 02/25/2016 through 02/27/2016 for acute viral gastroenteritis. Patient did see his primary care physician following the discharge at which time he reported nonadherence to treatment plan because he could not afford some of medications.  On admission, patient was tachycardic with heart rate in 140s. His EKG showed atrial fibrillation with RVR. Chest x-ray showed cardiomegaly with pulmonary venous congestion. Creatinine was 4.69. On 02/27/2016 creatinine was 1.83. Hemoglobin was 9.6. His ammonia level was 160.  Assessment & Plan:  Acute on chronic systolic CHF  - Stable respiratory status at this time - Patient had a 2-D echo done 03/10/2016 with ejection fraction of 40-45% - Weight on 03/16/2016 was 95.8 kg and this morning is 86.41 kg - He is on Lasix 80 mg IV every 12 hours - Continue daily weight and strict intake and output - Continue to monitor renal function while patient on high-dose Lasix - Continue digoxin 0.0625 mg daily - Continue isosorbide 15 mg daily - He was on metoprolol 100 mg twice daily and this was resumed on admission.  Acute renal failure superimposed on chronic kidney disease stage IV - As mentioned above, creatinine on 02/27/2016 was 1.83 and creatinine on this admission 4.69 - Likely from Lasix however  even while on Lasix creatinine is now improving. 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  Objective: Filed Vitals:   03/16/16 1506 03/16/16 1901 03/16/16 2108 03/17/16 0508  BP: 116/88  129/81 112/59  Pulse: 42  99 86  Temp: 98.1 F (36.7 C) 98.7 F (37.1 C) 98.4 F (36.9 C) 97.6 F (36.4 C)  TempSrc: Oral Oral Oral Oral  Resp: 18  17 18   Height:   5\' 5"  (1.651 m)   Weight:   87.363 kg (192 lb 9.6 oz) 86.41 kg (190 lb 8 oz)  SpO2: 98%  99% 95%    Intake/Output Summary (Last 24 hours) at 03/17/16 0705 Last data filed at 03/17/16 0300  Gross  per 24 hour  Intake   2040 ml  Output   6900 ml  Net  -4860 ml   Filed Weights   03/16/16 0500 03/16/16 2108 03/17/16 0508  Weight: 95.8 kg (211 lb 3.2 oz) 87.363 kg (192 lb 9.6 oz) 86.41 kg (190 lb 8 oz)    Examination:  General exam: Appears calm and comfortable  Respiratory system: Clear to auscultation. Respiratory effort  normal. Cardiovascular system: S1 & S2 heard, RRR. No JVD, murmurs, rubs, gallops or clicks. Slight lower extremity edema Gastrointestinal system: Abdomen is nondistended, soft and nontender. No organomegaly or masses felt. Normal bowel sounds heard. Central nervous system: Alert and oriented. No focal neurological deficits. Extremities: Symmetric 5 x 5 power. Skin: Slight ecchymosis and lower extremity but no ulcers or other lesions Psychiatry: Judgement and insight appear normal. Mood & affect appropriate.   Data Reviewed: I have personally reviewed following labs and imaging studies  CBC:  Recent Labs Lab 03/14/16 2100 03/16/16 0649 03/17/16 0327  WBC 6.6 6.0 6.5  HGB 9.6* 10.1* 10.1*  HCT 30.6* 31.9* 31.7*  MCV 67.8* 69.7* 69.1*  PLT 314 345 [id number]   Basic Metabolic Panel:  Recent Labs Lab 03/14/16 2100 03/16/16 0649 03/17/16 0327  NA 138 137 141  K 4.9 3.8 3.4*  CL 106 104 101  CO2 16* 22 29  GLUCOSE 122* 158* 113*  BUN 70* 63* 49*  CREATININE 4.69* 3.38* 2.30*  CALCIUM 9.0 9.1 9.6   GFR: Estimated Creatinine Clearance: 36.3 mL/min (by C-G formula based on Cr of 2.3). Liver Function Tests:  Recent Labs Lab 03/14/16 2326 03/16/16 0649 03/17/16 0327  AST 28 25 26   ALT 14* 14* 14*  ALKPHOS 117 103 106  BILITOT 1.8* 1.9* 1.8*  PROT 7.8 7.3 7.2  ALBUMIN 3.2* 2.8* 2.9*   No results for input(s): LIPASE, AMYLASE in the last 168 hours.  Recent Labs Lab 03/14/16 2326 03/15/16 0500 03/16/16 0649 03/17/16 0335  AMMONIA 160* 113* 88* 51*   Coagulation Profile:  Recent Labs Lab 03/14/16 1229 03/14/16 2326 03/15/16 1001 03/16/16 0649 03/17/16 0327  INR 1.8 1.90* 1.78* 2.20* 2.73*   Cardiac Enzymes: No results for input(s): CKTOTAL, CKMB, CKMBINDEX, TROPONINI in the last 168 hours. BNP (last 3 results) No results for input(s): PROBNP in the last 8760 hours. HbA1C: No results for input(s): HGBA1C in the last 72 hours. CBG:  Recent Labs Lab  03/16/16 0736 03/16/16 1213 03/16/16 1705 03/16/16 2132 03/17/16 0629  GLUCAP 159* 130* 122* 140* 98   Lipid Profile: No results for input(s): CHOL, HDL, LDLCALC, TRIG, CHOLHDL, LDLDIRECT in the last 72 hours. Thyroid Function Tests: No results for input(s): TSH, T4TOTAL, FREET4, T3FREE, THYROIDAB in the last 72 hours. Anemia Panel: No results for input(s): VITAMINB12, FOLATE, FERRITIN, TIBC, IRON, RETICCTPCT in the last 72 hours. Urine analysis:    Component Value Date/Time   COLORURINE YELLOW 02/25/2016 1026   APPEARANCEUR CLEAR 02/25/2016 1026   LABSPEC 1.009 02/25/2016 1026   PHURINE 7.5 02/25/2016 1026   GLUCOSEU NEGATIVE 02/25/2016 1026   HGBUR MODERATE* 02/25/2016 1026   BILIRUBINUR NEGATIVE 02/25/2016 1026   KETONESUR NEGATIVE 02/25/2016 1026   PROTEINUR 100* 02/25/2016 1026   NITRITE NEGATIVE 02/25/2016 1026   LEUKOCYTESUR NEGATIVE 02/25/2016 1026   Sepsis Labs: @LABRCNTIP (procalcitonin:4,lacticidven:4)   )No results found for this or any previous visit (from the past 240 hour(s)).    Radiology Studies: Dg Chest 2 View 03/14/2016  1. Cardiomegaly with pulmonary venous congestion, but no frank pulmonary  edema. 2. Atherosclerosis. Electronically Signed   By: Vinnie Langton M.D.   On: 03/14/2016 22:10      Scheduled Meds: . digoxin  0.0625 mg Oral Daily  . ferrous sulfate  325 mg Oral BID WC  . folic acid  1 mg Oral Daily  . furosemide  80 mg Intravenous Q12H  . insulin aspart  0-5 Units Subcutaneous QHS  . insulin aspart  0-9 Units Subcutaneous TID WC  . insulin glargine  10 Units Subcutaneous QHS  . isosorbide mononitrate  15 mg Oral Q supper  . lactulose  20 g Oral TID  . metoprolol  100 mg Oral BID  . Vitamin D (Ergocalciferol)  50,000 Units Oral Q Mon  . warfarin  5 mg Oral Once per day on Mon Wed  . warfarin  7.5 mg Oral Once per day on Sun Tue Thu Fri Sat  . Warfarin - Pharmacist Dosing Inpatient   Does not apply q1800   Continuous Infusions:     LOS: 3 days    Time spent: 25 minutes    Leisa Lenz, MD [facility] Pager [phone]  If 7PM-7AM, please contact night-coverage [email or web address] Password [email or web address] 03/17/2016, 7:05 AM

## 2016-03-17 NOTE — Care Management Important Message (Signed)
Important Message  Patient Details  Name: Michael [name] MRN: [id number] Date of Birth: [birth year]   Medicare Important Message Given:  Yes    Zaidin Blyden P Lizbett Garciagarcia 03/17/2016, 1:54 PM

## 2016-03-18 DIAGNOSIS — N179 Acute kidney failure, unspecified: Secondary | ICD-10-CM

## 2016-03-18 DIAGNOSIS — N184 Chronic kidney disease, stage 4 (severe): Secondary | ICD-10-CM

## 2016-03-18 LAB — BASIC METABOLIC PANEL
Anion gap: 11 (ref 5–15)
BUN: 42 mg/dL — AB (ref 6–20)
CALCIUM: 9.1 mg/dL (ref 8.9–10.3)
CHLORIDE: 98 mmol/L — AB (ref 101–111)
CO2: 29 mmol/L (ref 22–32)
CREATININE: 1.99 mg/dL — AB (ref 0.61–1.24)
GFR calc non Af Amer: 36 mL/min — ABNORMAL LOW (ref >60)
GFR, EST AFRICAN AMERICAN: 41 mL/min — AB (ref >60)
Glucose, Bld: 119 mg/dL — ABNORMAL HIGH (ref 65–99)
Potassium: 3.7 mmol/L (ref 3.5–5.1)
SODIUM: 138 mmol/L (ref 135–145)

## 2016-03-18 LAB — PROTIME-INR
INR: >10 (ref 0.00–1.49)
INR: 2.46 — AB (ref 0.00–1.49)
Prothrombin Time: 26.4 seconds — ABNORMAL HIGH (ref 11.6–15.2)

## 2016-03-18 LAB — GLUCOSE, CAPILLARY
Glucose-Capillary: 108 mg/dL — ABNORMAL HIGH (ref 65–99)
Glucose-Capillary: 110 mg/dL — ABNORMAL HIGH (ref 65–99)

## 2016-03-18 MED ORDER — LACTULOSE 10 GM/15ML PO SOLN: 20.0000 g | Freq: Every day | ORAL | Status: DC

## 2016-03-18 MED ORDER — WARFARIN SODIUM 2.5 MG PO TABS: 2.5000 mg | ORAL_TABLET | Freq: Every day | ORAL | Status: DC

## 2016-03-18 MED ORDER — WARFARIN SODIUM 7.5 MG PO TABS: 7.5000 mg | ORAL_TABLET | Freq: Once | ORAL | Status: DC

## 2016-03-18 MED ORDER — INSULIN GLARGINE 100 UNIT/ML ~~LOC~~ SOLN: 15.0000 [IU] | Freq: Every day | SUBCUTANEOUS | Status: DC

## 2016-03-18 NOTE — Progress Notes (Signed)
Pt. With critical PT > 90, INR > 10 this am. On call NP, Tylene Fantasia notified via text page. Sani Madariaga, Katherine Roan

## 2016-03-18 NOTE — Progress Notes (Signed)
0830 Pt have very abnormal PT/inr no sign of bleeding noted . Emaill sent to MD . Hulen Skains ans spoken with pharmacy . With order

## 2016-03-18 NOTE — Discharge Instructions (Signed)
Warfarin tablets What is this medicine? WARFARIN (WAR far in) is an anticoagulant. It is used to treat or prevent clots in the veins, arteries, lungs, or heart. This medicine may be used for other purposes; ask your health care provider or pharmacist if you have questions. What should I tell my health care provider before I take this medicine? They need to know if you have any of these conditions: -alcoholism -anemia -bleeding disorders -cancer -diabetes -heart disease -high blood pressure -history of bleeding in the gastrointestinal tract -history of stroke or other brain injury or disease -kidney or liver disease -protein C deficiency -protein S deficiency -psychosis or dementia -recent injury, recent or planned surgery or procedure -an unusual or allergic reaction to warfarin, other medicines, foods, dyes, or preservatives -pregnant or trying to get pregnant -breast-feeding How should I use this medicine? Take this medicine by mouth with a glass of water. Follow the directions on the prescription label. You can take this medicine with or without food. Take your medicine at the same time each day. Do not take it more often than directed. Do not stop taking except on your doctor's advice. Stopping this medicine may increase your risk of a blood clot. Be sure to refill your prescription before you run out of medicine. If your doctor or healthcare professional calls to change your dose, write down the dose and any other instructions. Always read the dose and instructions back to him or her to make sure you understand them. Tell your doctor or healthcare professional what strength of tablets you have on hand. Ask how many tablets you should take to equal your new dose. Write the date on the new instructions and keep them near your medicine. If you are told to stop taking your medicine until your next blood test, call your doctor or healthcare professional if you do not hear anything within 24  hours of the test to find out your new dose or when to restart your prior dose. A special MedGuide will be given to you by the pharmacist with each prescription and refill. Be sure to read this information carefully each time. Talk to your pediatrician regarding the use of this medicine in children. Special care may be needed. Overdosage: If you think you have taken too much of this medicine contact a poison control center or emergency room at once. NOTE: This medicine is only for you. Do not share this medicine with others. What if I miss a dose? It is important not to miss a dose. If you miss a dose, call your healthcare provider. Take the dose as soon as possible on the same day. If it is almost time for your next dose, take only that dose. Do not take double or extra doses to make up for a missed dose. What may interact with this medicine? Do not take this medicine with any of the following medications: -agents that prevent or dissolve blood clots -aspirin or other salicylates -danshen -dextrothyroxine -mifepristone -St. John's Wort -red yeast rice This medicine may also interact with the following medications: -acetaminophen -agents that lower cholesterol -alcohol -allopurinol -amiodarone -antibiotics or medicines for treating bacterial, fungal or viral infections -azathioprine -barbiturate medicines for inducing sleep or treating seizures -certain medicines for diabetes -certain medicines for heart rhythm problems -certain medicines for high blood pressure -chloral hydrate -cisapride -disulfiram -male hormones, including contraceptive or birth control pills -general anesthetics -herbal or dietary products like garlic, ginkgo, ginseng, green tea, or kava kava -influenza virus vaccine -male  hormones -medicines for mental depression or psychosis -medicines for some types of cancer -medicines for stomach problems -methylphenidate -NSAIDs, medicines for pain and  inflammation, like ibuprofen or naproxen -propoxyphene -quinidine, quinine -raloxifene -seizure or epilepsy medicine like carbamazepine, phenytoin, and valproic acid -steroids like cortisone and prednisone -tamoxifen -thyroid medicine -tramadol -vitamin c, vitamin e, and vitamin K -zafirlukast -zileuton This list may not describe all possible interactions. Give your health care provider a list of all the medicines, herbs, non-prescription drugs, or dietary supplements you use. Also tell them if you smoke, drink alcohol, or use illegal drugs. Some items may interact with your medicine. What should I watch for while using this medicine? Visit your doctor or health care professional for regular checks on your progress. You will need to have a blood test called a PT/INR regularly. The PT/INR blood test is done to make sure you are getting the right dose of this medicine. It is important to not miss your appointment for the blood tests. When you first start taking this medicine, these tests are done often. Once the correct dose is determined and you take your medicine properly, these tests can be done less often. Notify your doctor or health care professional and seek emergency treatment if you develop breathing problems; changes in vision; chest pain; severe, sudden headache; pain, swelling, warmth in the leg; trouble speaking; sudden numbness or weakness of the face, arm or leg. These can be signs that your condition has gotten worse. While you are taking this medicine, carry an identification card with your name, the name and dose of medicine(s) being used, and the name and phone number of your doctor or health care professional or person to contact in an emergency. Do not start taking or stop taking any medicines or over-the-counter medicines except on the advice of your doctor or health care professional. You should discuss your diet with your doctor or health care professional. Do not make major  changes in your diet. Vitamin K can affect how well this medicine works. Many foods contain vitamin K. It is important to eat a consistent amount of foods with vitamin K. Other foods with vitamin K that you should eat in consistent amounts are asparagus, basil, beef or pork liver, black eyed peas, broccoli, brussel sprouts, cabbage, chickpeas, cucumber with peel, green onions, green tea, okra, parsley, peas, thyme, and green leafy vegetables like beet greens, collard greens, endive, kale, mustard greens, spinach, turnip greens, watercress, or certain lettuces like green leaf or romaine. This medicine can cause birth defects or bleeding in an unborn child. Women of childbearing age should use effective birth control while taking this medicine. If a woman becomes pregnant while taking this medicine, she should discuss the potential risks and her options with her health care professional. Avoid sports and activities that might cause injury while you are using this medicine. Severe falls or injuries can cause unseen bleeding. Be careful when using sharp tools or knives. Consider using an Copy. Take special care brushing or flossing your teeth. Report any injuries, bruising, or red spots on the skin to your doctor or health care professional. If you have an illness that causes vomiting, diarrhea, or fever for more than a few days, contact your doctor. Also check with your doctor if you are unable to eat for several days. These problems can change the effect of this medicine. Even after you stop taking this medicine, it takes several days before your body recovers its normal ability to clot  blood. Ask your doctor or health care professional how long you need to be careful. If you are going to have surgery or dental work, tell your doctor or health care professional that you have been taking this medicine. What side effects may I notice from receiving this medicine? Side effects that you should report to  your doctor or health care professional as soon as possible: -back pain -chills -dizziness -fever -heavy menstrual bleeding or vaginal bleeding -painful, blue, or purple toes -painful, prolonged erection -signs and symptoms of bleeding such as bloody or black, tarry stools; red or dark-brown urine; spitting up blood or brown material that looks like coffee grounds; red spots on the skin; unusual bruising or bleeding from the eye, gums, or nose-skin rash, itching or skin damage -stomach pain -unusually weak or tired -yellowing of skin or eyes Side effects that usually do not require medical attention (report to your doctor or health care professional if they continue or are bothersome): -diarrhea -hair loss This list may not describe all possible side effects. Call your doctor for medical advice about side effects. You may report side effects to FDA at 1-800-FDA-1088. Where should I keep my medicine? Keep out of the reach of children. Store at room temperature between 15 and 30 degrees C (59 and 86 degrees F). Protect from light. Throw away any unused medicine after the expiration date. Do not flush down the toilet. NOTE: This sheet is a summary. It may not cover all possible information. If you have questions about this medicine, talk to your doctor, pharmacist, or health care provider.    2016, [employer]vier/Gold Standard. (2013-05-25 12:17:56) Furosemide tablets What is this medicine? FUROSEMIDE (fyoor [place] se mide) is a diuretic. It helps you make more urine and to lose salt and excess water from your body. This medicine is used to treat high blood pressure, and edema or swelling from heart, kidney, or liver disease. This medicine may be used for other purposes; ask your health care provider or pharmacist if you have questions. What should I tell my health care provider before I take this medicine? They need to know if you have any of these conditions: -abnormal blood electrolytes -diarrhea  or vomiting -gout -heart disease -kidney disease, small amounts of urine, or difficulty passing urine -liver disease -thyroid disease -an unusual or allergic reaction to furosemide, sulfa drugs, other medicines, foods, dyes, or preservatives -pregnant or trying to get pregnant -breast-feeding How should I use this medicine? Take this medicine by mouth with a glass of water. Follow the directions on the prescription label. You may take this medicine with or without food. If it upsets your stomach, take it with food or milk. Do not take your medicine more often than directed. Remember that you will need to pass more urine after taking this medicine. Do not take your medicine at a time of day that will cause you problems. Do not take at bedtime. Talk to your pediatrician regarding the use of this medicine in children. While this drug may be prescribed for selected conditions, precautions do apply. Overdosage: If you think you have taken too much of this medicine contact a poison control center or emergency room at once. NOTE: This medicine is only for you. Do not share this medicine with others. What if I miss a dose? If you miss a dose, take it as soon as you can. If it is almost time for your next dose, take only that dose. Do not take double or extra  doses. What may interact with this medicine? -aspirin and aspirin-like medicines -certain antibiotics -chloral hydrate -cisplatin -cyclosporine -digoxin -diuretics -laxatives -lithium -medicines for blood pressure -medicines that relax muscles for surgery -methotrexate -NSAIDs, medicines for pain and inflammation like ibuprofen, naproxen, or indomethacin -phenytoin -steroid medicines like prednisone or cortisone -sucralfate -thyroid hormones This list may not describe all possible interactions. Give your health care provider a list of all the medicines, herbs, non-prescription drugs, or dietary supplements you use. Also tell them if you  smoke, drink alcohol, or use illegal drugs. Some items may interact with your medicine. What should I watch for while using this medicine? Visit your doctor or health care professional for regular checks on your progress. Check your blood pressure regularly. Ask your doctor or health care professional what your blood pressure should be, and when you should contact him or her. If you are a diabetic, check your blood sugar as directed. You may need to be on a special diet while taking this medicine. Check with your doctor. Also, ask how many glasses of fluid you need to drink a day. You must not get dehydrated. You may get drowsy or dizzy. Do not drive, use machinery, or do anything that needs mental alertness until you know how this drug affects you. Do not stand or sit up quickly, especially if you are an older patient. This reduces the risk of dizzy or fainting spells. Alcohol can make you more drowsy and dizzy. Avoid alcoholic drinks. This medicine can make you more sensitive to the sun. Keep out of the sun. If you cannot avoid being in the sun, wear protective clothing and use sunscreen. Do not use sun lamps or tanning beds/booths. What side effects may I notice from receiving this medicine? Side effects that you should report to your doctor or health care professional as soon as possible: -blood in urine or stools -dry mouth -fever or chills -hearing loss or ringing in the ears -irregular heartbeat -muscle pain or weakness, cramps -skin rash -stomach upset, pain, or nausea -tingling or numbness in the hands or feet -unusually weak or tired -vomiting or diarrhea -yellowing of the eyes or skin Side effects that usually do not require medical attention (report to your doctor or health care professional if they continue or are bothersome): -headache -loss of appetite -unusual bleeding or bruising This list may not describe all possible side effects. Call your doctor for medical advice about  side effects. You may report side effects to FDA at 1-800-FDA-1088. Where should I keep my medicine? Keep out of the reach of children. Store at room temperature between 15 and 30 degrees C (59 and 86 degrees F). Protect from light. Throw away any unused medicine after the expiration date. NOTE: This sheet is a summary. It may not cover all possible information. If you have questions about this medicine, talk to your doctor, pharmacist, or health care provider.    2016, [employer]vier/Gold Standard. (2015-01-24 13:49:50)

## 2016-03-18 NOTE — Hospital Discharge Follow-Up (Signed)
The patient is known to the [facility] at the [facility]  This CM met with him and reminded him of his upcoming appointment with Dr Jarold Song on 03/25/16 @ 1430.  The appointment information has been placed on the AVS.  His daughter, Chong Sicilian, was present during the meeting. Also reminded the patient to bring all of his medications to his appointment for the doctor to review.  He has a glucometer at home to check his blood sugars. He has not had problems with securing transportation to his clinic appointments.  Olga Coaster, RN CM notified that the patient has been followed at the [facility].

## 2016-03-18 NOTE — Progress Notes (Signed)
OT Cancellation Note  Patient Details Name: Michael [name] MRN: [id number] DOB: January 24, 1961   Cancelled Treatment:    Reason Eval/Treat Not Completed: OT screened, no needs identified, will sign off. Per PT verbal report, pt is functioning at an overall supervision to mod I level for ADL and functional mobility and no OT needs identified at this time - will sign off. If any new OT needs arise during this admission, please re-order OT. Thank you for the order.   Chrys Racer , MS, OTR/L, CLT Pager: [phone]  03/18/2016, 1:03 PM

## 2016-03-18 NOTE — Discharge Summary (Signed)
Physician Discharge Summary  Michael [name] [id number] DOB: 02/11/1961 DOA: 03/14/2016  PCP: [name], MD  Admit date: 03/14/2016 Discharge date: 03/18/2016  Recommendations for Outpatient Follow-up:  Home on home coumadin dose of 5 mg MW, 7.5 mg other days.  Your INR is 2.46 on discharge.  Please follow-up with primary care physician in about one week to make sure that INR is at therapeutic level and kidney function is stable. Creatinine on discharge is 1.99 which is your baseline. Hemoglobin is 10.2 on discharge.  Discharge Diagnoses:  Principal Problem:   Volume overload Active Problems:   CKD (chronic kidney disease), stage IV (HCC)   DM type 2 (diabetes mellitus, type 2) (HCC)   Chronic atrial fibrillation with RVR (HCC)   Acute on chronic systolic CHF (congestive heart failure) (HCC)   Anemia, chronic disease   Acute renal failure (ARF) (HCC)   Acute hepatic encephalopathy (HCC)   Liver cirrhosis (HCC)   Acute congestive heart failure (HCC)   Insulin dependent diabetes mellitus ([place])    Discharge Condition: stable   Diet recommendation: as tolerated   History of present illness:  55 year old male with past medical history significant for systolic CHF, atrial fibrillation on anticoagulation with Coumadin, chronic kidney disease stage IV, chronic artery disease, diabetes mellitus on insulin, liver cirrhosis. Patient presented to [facility] 03/14/2016 with worsening shortness of breath, lower extremity swelling and palpitations for 2 days prior to the admission. Patient was hospitalized earlier in April, 02/25/2016 through 02/27/2016 for acute viral gastroenteritis. Patient did see his primary care physician following the discharge at which time he reported nonadherence to treatment plan because he could not afford some of medications.  On admission, patient was tachycardic with heart rate in 140s. His EKG showed atrial fibrillation with RVR. Chest x-ray  showed cardiomegaly with pulmonary venous congestion. Creatinine was 4.69. On 02/27/2016 creatinine was 1.83. Hemoglobin was 9.6. His ammonia level was 160.  Hospital Course:   Assessment & Plan:  Acute on chronic systolic CHF  - Stable respiratory status at this time - Patient had a 2-D echo done 03/10/2016 with ejection fraction of 40-45% - Weight trend as below [employer]   03/16/16 2108 03/17/16 0508 03/18/16 0612  Weight: 87.363 kg (192 lb 9.6 oz) 86.41 kg (190 lb 8 oz) 85.231 kg (187 lb 14.4 oz)  - He is on Lasix 80 mg IV every 12 hours - Patient can continue Lasix per home dose on discharge - Continue digoxin 0.0625 mg daily - Continue isosorbide 15 mg daily - Continue metoprolol 100 mg twice daily  Acute renal failure superimposed on chronic kidney disease stage IV - As mentioned above, creatinine on 02/27/2016 was 1.83 and creatinine on this admission 4.69 - Likely from Lasix however even while on Lasix creatinine is now improving and closer to patient's baseline, 1.9  Idiopathic Cirrhosis with acute hepatic encephalopathy  - Ammonia level 160 on admission and then 51 - Mental status   Chronic atrial fibrillation with RVR  - CHADS-VASc is 2 - Continue anticoagulation with Coumadin - Heart rate controlled with metoprolol and digoxin  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 - 2012 at [facility] in [place] - followed by [place] - TTE 02/08/2016 no evidence of acute issues   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 I called his son's cellular number and left message that patient is ready for discharge and I provided my cell phone number to call me back for details and for updates    Consultants:   None  Procedures:   None  Antimicrobials:   None   Signed:  Leisa Lenz, MD  [facility] 03/18/2016, 10:54 AM  Pager #: [phone]  Time spent in minutes: more than 30 minutes  Discharge Exam: Filed Vitals:   03/17/16 1151 03/17/16 2000  BP: 124/73 110/77  Pulse: 104 94  Temp: 98.5 F (36.9 C) 97.7 F (36.5 C)  Resp: 18 17   Filed Vitals:   03/17/16 1104 03/17/16 1151 03/17/16 2000 03/18/16 0612  BP:  124/73 110/77   Pulse: 89 104 94   Temp:  98.5 F (36.9 C) 97.7 F (36.5 C)   TempSrc:  Oral    Resp:  18 17   Height:      Weight:    85.231 kg (187 lb 14.4 oz)  SpO2:  95% 97%     General: Pt is alert, follows commands appropriately, not in acute distress Cardiovascular: Regular rate and rhythm, S1/S2 + Respiratory: Clear to auscultation bilaterally, no wheezing, no crackles, no rhonchi Abdominal: Soft, non tender, non distended, bowel sounds +, no guarding Extremities: LE edema improving, no cyanosis, pulses palpable bilaterally DP and PT Neuro: Grossly nonfocal  Discharge Instructions  Discharge Instructions    Call MD for:  difficulty breathing, headache or visual disturbances    Complete by:  As directed      Call MD for:  persistant nausea and vomiting    Complete by:  As directed      Call MD for:  severe uncontrolled pain    Complete by:  As directed      Diet - low sodium heart healthy    Complete by:  As directed      Discharge instructions    Complete by:  As directed   Home on home coumadin dose of 5 mg MW, 7.5 mg other days.  Your INR is 2.46 on discharge.  Please follow-up with primary care physician in about one week to make sure that INR is at therapeutic  level and kidney function is stable. Creatinine on discharge is 1.99 which is your baseline. Hemoglobin is 10.2 on discharge.     Increase activity slowly    Complete by:  As directed             Medication List    TAKE these medications        ACCU-CHEK AVIVA device  Use as Instructed 3 times daily     accu-chek softclix lancets  Use as instructed 3 times daily     acetaminophen 325 MG tablet  Commonly known as:  TYLENOL  Take 650 mg by mouth every 6 (six) hours as needed for headache (pain). Reported on 01/10/2016     allopurinol 100 MG tablet  Commonly known as:  ZYLOPRIM  Take 1 tablet (100 mg total) by mouth 2 (two) times daily.     colchicine 0.6 MG tablet  Take 2 tablets (1.2 mg) at the  onset of a gout flare, may repeat 1 tablet (0.6 mg) in one hour if symptoms persist.     digoxin 0.125 MG tablet  Commonly known as:  LANOXIN  Take 1 tablet (0.125 mg total) by mouth daily.     diphenhydramine-acetaminophen 25-500 MG Tabs tablet  Commonly known as:  TYLENOL PM  Take 1 tablet by mouth daily as needed. For sleep     ferrous sulfate 325 (65 FE) MG tablet  Take 1 tablet (325 mg total) by mouth 2 (two) times daily with a meal.     folic acid 1 MG tablet  Commonly known as:  FOLVITE  Take 1 tablet (1 mg total) by mouth daily.     furosemide 80 MG tablet  Commonly known as:  LASIX  Take 1 tablet (80 mg total) by mouth 2 (two) times daily.     glucose blood test strip  Commonly known as:  ACCU-CHEK AVIVA  Use 3 times daily as directed.     insulin glargine 100 UNIT/ML injection  Commonly known as:  LANTUS  Inject 0.15 mLs (15 Units total) into the skin at bedtime.     isosorbide mononitrate 30 MG 24 hr tablet  Commonly known as:  IMDUR  Take 0.5 tablets (15 mg total) by mouth daily.     lactulose 10 GM/15ML solution  Commonly known as:  CHRONULAC  Take 30 mLs (20 g total) by mouth daily.     lidocaine 2 % solution  Commonly known as:  XYLOCAINE  Use as  directed 10 mLs in the mouth or throat every 6 (six) hours as needed for mouth pain.     metoprolol 100 MG tablet  Commonly known as:  LOPRESSOR  Take 1 tablet (100 mg total) by mouth 2 (two) times daily.     omeprazole 20 MG capsule  Commonly known as:  PRILOSEC  Take 1 capsule (20 mg total) by mouth daily.     potassium chloride SA 20 MEQ tablet  Commonly known as:  K-DUR,KLOR-CON  Take 1 tablet (20 mEq total) by mouth daily.     traMADol 50 MG tablet  Commonly known as:  ULTRAM  Take 1 tablet (50 mg total) by mouth every 8 (eight) hours as needed.     Vitamin D (Ergocalciferol) 50000 units Caps capsule  Commonly known as:  DRISDOL  Take 50,000 Units by mouth every 7 (seven) days. Reported on 01/10/2016. Take on Mondays     warfarin 2.5 MG tablet  Commonly known as:  COUMADIN  Take 1 tablet (2.5 mg total) by mouth daily. Please take 5 mg on Monday and Wednesday and then 7.5 mg on every other day which is Tuesday, Thursday, Friday, Saturday and Sunday.           Follow-up Information    Follow up with [name], MD. Schedule an appointment as soon as possible for a visit in 1 week.   Specialty:  Family Medicine   Why:  Follow up appt after recent hospitalization, check INR an d creatinine    Contact information:   [place] [address] [phone]        The results of significant diagnostics from this hospitalization (including imaging, microbiology, ancillary and laboratory) are listed below for reference.    Significant Diagnostic Studies: Dg Chest 2 View  03/14/2016  CLINICAL DATA:  Tachycardia and shortness of breath for the past 2 days. EXAM: CHEST  2 VIEW COMPARISON:  Chest x-ray  02/19/2016. FINDINGS: Lung volumes are slightly low. Bibasilar subsegmental atelectasis. No acute consolidative airspace disease. No pleural effusions. Cephalization of the pulmonary vasculature, without frank pulmonary edema. Heart size is moderately enlarged.  The patient is rotated to the right on today's exam, resulting in distortion of the mediastinal contours and reduced diagnostic sensitivity and specificity for mediastinal pathology. Atherosclerosis in the thoracic aorta. Status post median sternotomy for mitral valve replacement. IMPRESSION: 1. Cardiomegaly with pulmonary venous congestion, but no frank pulmonary edema. 2. Atherosclerosis. Electronically Signed   By: Vinnie Langton M.D.   On: 03/14/2016 22:10   Dg Chest 2 View  02/19/2016  CLINICAL DATA:  Short of breath today EXAM: CHEST  2 VIEW COMPARISON:  12/31/2015 FINDINGS: Severe cardiomegaly is stable. Normal vascularity. No pneumothorax. No pleural effusion. IMPRESSION: Cardiomegaly without decompensation. Electronically Signed   By: Marybelle Killings M.D.   On: 02/19/2016 15:49   Ct Abdomen Pelvis W Contrast  02/25/2016  CLINICAL DATA:  Abdominal pain EXAM: CT ABDOMEN AND PELVIS WITH CONTRAST TECHNIQUE: Multidetector CT imaging of the abdomen and pelvis was performed using the standard protocol following bolus administration of intravenous contrast. CONTRAST:  80 mL Isovue-300 IV COMPARISON:  Renal ultrasound 09/11/2015 FINDINGS: Lower chest: . Small bilateral pleural effusions with mild compressive atelectasis right lower lobe. Cardiac enlargement. Left atrial enlargement. Prior mitral valve repair. Hepatobiliary: Liver is smaller than expected with irregular contour of the capsule suggestive of cirrhosis. No focal liver mass. Moderate ascites also consistent with cirrhosis. Gallbladder and bile ducts normal. Pancreas: Negative Spleen: Negative Adrenals/Urinary Tract: No renal mass or obstruction. There is asymmetric contrast excretion which is slightly delayed on the right. The etiology is not apparent. No ureteral stone or mass. Urinary bladder normal. Stomach/Bowel: Small bowel air-fluid levels in the left abdomen without significant dilatation of bowel. There is no colonic edema or dilatation.  Appendix not well visualized but no evidence of acute appendicitis. Vascular/Lymphatic: Mild atherosclerotic calcification in the aorta and iliac arteries without aneurysm. No adenopathy. Reproductive: Mild prostate enlargement with prostate calcification. Other: Moderate ascites Musculoskeletal: No acute skeletal abnormality. IMPRESSION: Small bilateral effusions Moderate ascites with probable cirrhosis of the liver. Cardiac enlargement with left atrial enlargement. Prior mitral valve repair. Mild asymmetric delayed excretion of the right kidney. No stone or mass is seen. Cause of this discrepancy is not clear. Air-fluid levels in nondilated small bowel in the left abdomen. This may be due to gastroenteritis or ileus. Early small bowel obstruction not excluded. Electronically Signed   By: Franchot Gallo M.D.   On: 02/25/2016 12:05   US Abdomen Limited Ruq  02/25/2016  CLINICAL DATA:  Cirrhosis of the liver. Pancreatitis. Ascites. Hypoalbuminemia. EXAM: US ABDOMEN LIMITED - RIGHT UPPER QUADRANT COMPARISON:  Abdomen CT obtained today. FINDINGS: Gallbladder: Mild diffuse wall thickening with minimal hypoechoic edema. The maximum wall thickness is 3.2 mm. No gallstones seen. The patient was not focally tender over the gallbladder. Common bile duct: Diameter: 4.1 mm proximally. Liver: Mildly lobulated contours. Normal echogenicity. 8 mm linear echogenic focus in the posterior aspect of the right lobe, with no corresponding abnormality on the recent CT. Other:  Small amount of free peritoneal fluid. IMPRESSION: 1. Mild diffuse gallbladder wall thickening with minimal hypoechoic wall edema. This is most likely due to the patient's hypoalbuminemia. 2. Mildly lobulated liver contours, compatible with the history of cirrhosis. 3. Probable developing calcification in the posterior aspect of the right lobe of the liver. 4. Small amount of ascites. Electronically Signed  By: Claudie Revering M.D.   On: 02/25/2016 17:50     Microbiology: No results found for this or any previous visit (from the past 240 hour(s)).   Labs: Basic Metabolic Panel:  Recent Labs Lab 03/14/16 2100 03/16/16 0649 03/17/16 0327 03/18/16 0337  NA 138 137 141 138  K 4.9 3.8 3.4* 3.7  CL 106 104 101 98*  CO2 16* 22 29 29   GLUCOSE 122* 158* 113* 119*  BUN 70* 63* 49* 42*  CREATININE 4.69* 3.38* 2.30* 1.99*  CALCIUM 9.0 9.1 9.6 9.1   Liver Function Tests:  Recent Labs Lab 03/14/16 2326 03/16/16 0649 03/17/16 0327  AST 28 25 26   ALT 14* 14* 14*  ALKPHOS 117 103 106  BILITOT 1.8* 1.9* 1.8*  PROT 7.8 7.3 7.2  ALBUMIN 3.2* 2.8* 2.9*   No results for input(s): LIPASE, AMYLASE in the last 168 hours.  Recent Labs Lab 03/14/16 2326 03/15/16 0500 03/16/16 0649 03/17/16 0335  AMMONIA 160* 113* 88* 51*   CBC:  Recent Labs Lab 03/14/16 2100 03/16/16 0649 03/17/16 0327 03/17/16 1235  WBC 6.6 6.0 6.5 5.8  HGB 9.6* 10.1* 10.1* 10.2*  HCT 30.6* 31.9* 31.7* 32.6*  MCV 67.8* 69.7* 69.1* 67.8*  PLT 314 345 343 347   Cardiac Enzymes: No results for input(s): CKTOTAL, CKMB, CKMBINDEX, TROPONINI in the last 168 hours. BNP: BNP (last 3 results)  Recent Labs  12/29/15 1436 02/19/16 1528 03/14/16 2243  BNP 1453.8* 533.9* 1080.3*    ProBNP (last 3 results) No results for input(s): PROBNP in the last 8760 hours.  CBG:  Recent Labs Lab 03/16/16 2132 03/17/16 0629 03/17/16 1900 03/17/16 2102 03/18/16 0722  GLUCAP 140* 98 164* 107* 108*

## 2016-03-18 NOTE — Evaluation (Signed)
Physical Therapy Evaluation Patient Details Name: Michael [name] MRN: [id number] DOB: Jul 14, 1961 Today's Date: 03/18/2016   History of Present Illness  Pt is a 55 y/o M who presented w/ SOB, LE swelling and palpitations.  Admitting dx: volume overload w/ acute on chorinc systolic CHF.  PMH includes recent hospitalization 02/25/16-02/27/16 for acute viral gastroenteritis, CHF, a-fib, CKD, CAD, DM, liver cirrhosis, mitral valve replacement, anemia.    Clinical Impression  Pt admitted with above diagnosis. Pt currently with functional limitations due to the deficits listed below (see PT Problem List).  Supervision for safety w/ ambulation and stair training. Pt c/o Rt knee pain that has been on/off for the past year.  Tender to palpation Rt tibial tuberosity and patellar tendon.  Likely tendonitis, recommend OPPT for follow up. Pt reports son able to drive him to appointments.  Pt will benefit from skilled PT to increase their independence and safety with mobility to allow discharge to the venue listed below.      Follow Up Recommendations Outpatient PT;Supervision - Intermittent    Equipment Recommendations  None recommended by PT    Recommendations for Other Services       Precautions / Restrictions Precautions Precautions: Fall Restrictions Weight Bearing Restrictions: No      Mobility  Bed Mobility Overal bed mobility: Independent             General bed mobility comments: No cues or physical assist needed  Transfers Overall transfer level: Independent Equipment used: None             General transfer comment: No cues or physical assist needed  Ambulation/Gait Ambulation/Gait assistance: Supervision Ambulation Distance (Feet): 100 Feet Assistive device: None Gait Pattern/deviations: Step-through pattern;Decreased stride length   Gait velocity interpretation: Below normal speed for age/gender General Gait Details: Decreased gait speed.  Supervision for  safety  Stairs Stairs: Yes Stairs assistance: Supervision Stair Management: One rail Right;Step to pattern;Forwards Number of Stairs: 10 General stair comments: Rt knee more painful when leading w/ Rt LE when descending.  Supervision for safety.  Wheelchair Mobility    Modified Rankin (Stroke Patients Only)       Balance Overall balance assessment: Needs assistance Sitting-balance support: No upper extremity supported;Feet supported Sitting balance-Leahy Scale: Normal     Standing balance support: No upper extremity supported;During functional activity Standing balance-Leahy Scale: Good                               Pertinent Vitals/Pain Pain Assessment: 0-10 Pain Score: 8  Pain Location: Rt tibial tuberosity/patella tendon Pain Descriptors / Indicators: Grimacing;Aching Pain Intervention(s): Limited activity within patient's tolerance;Monitored during session;Repositioned    Home Living Family/patient expects to be discharged to:: Private residence Living Arrangements: Children Available Help at Discharge: Family;Available PRN/intermittently Type of Home: Apartment Home Access: Stairs to enter Entrance Stairs-Rails: Right Entrance Stairs-Number of Steps: 10 Home Layout: One level Home Equipment: Walker - 2 wheels Additional Comments: Lives w/ son who goes to work during the day.  Daughter lives in town as well.    Prior Function Level of Independence: Independent         Comments: On disability following heart surgery     Hand Dominance        Extremity/Trunk Assessment   Upper Extremity Assessment: Overall WFL for tasks assessed           Lower Extremity Assessment: RLE deficits/detail RLE Deficits / Details: tender to  palpation over tibial tuberosity and patella tendon.  Strong but painful resisted knee extension    Cervical / Trunk Assessment: Normal  Communication   Communication: No difficulties  Cognition Arousal/Alertness:  Awake/alert Behavior During Therapy: WFL for tasks assessed/performed Overall Cognitive Status: Within Functional Limits for tasks assessed                      General Comments General comments (skin integrity, edema, etc.): Pt c/o Rt knee pain that has been on/off for the past year.  Tender to palpation Rt tibial tuberosity and patellar tendon.  Likely tendonitis, recommend OPPT for follow up. Pt reports son able to drive him to appointments.    Exercises        Assessment/Plan    PT Assessment Patient needs continued PT services  PT Diagnosis Acute pain   PT Problem List Decreased balance;Pain  PT Treatment Interventions DME instruction;Gait training;Stair training;Functional mobility training;Therapeutic activities;Therapeutic exercise;Balance training;Patient/family education;Modalities   PT Goals (Current goals can be found in the Care Plan section) Acute Rehab PT Goals Patient Stated Goal: decreased pain  PT Goal Formulation: With patient Time For Goal Achievement: 04/01/16 Potential to Achieve Goals: Good    Frequency Min 3X/week   Barriers to discharge Inaccessible home environment;Decreased caregiver support Alone during the day, steps to enter home    Co-evaluation               End of Session Equipment Utilized During Treatment: Gait belt Activity Tolerance: Patient tolerated treatment well;Patient limited by pain Patient left: in bed;with call bell/phone within reach;with bed alarm set Nurse Communication: Mobility status;Other (comment) (Rt knee pain; OPPT to address)         Time: [id number] PT Time Calculation (min) (ACUTE ONLY): 22 min   Charges:   PT Evaluation $PT Eval Low Complexity: 1 Procedure     PT G Codes:       Collie Siad PT, DPT  Pager: [phone] Phone: [phone] 03/18/2016, 3:45 PM

## 2016-03-18 NOTE — Progress Notes (Signed)
PT Cancellation Note  Patient Details Name: Michael [name] MRN: [id number] DOB: September 13, 1961   Cancelled Treatment:    Reason Eval/Treat Not Completed: Medical issues which prohibited therapy (INR>10).  Will check on pt again this afternoon, schedule permitting, to see if more medically appropriate.  Collie Siad PT, DPT  Pager: [phone] Phone: [phone] 03/18/2016, 10:26 AM

## 2016-03-18 NOTE — Progress Notes (Signed)
ANTICOAGULATION CONSULT NOTE - Follow Up Consult  Pharmacy Consult for Warfarin Indication: atrial fibrillation  No Known Allergies  Patient Measurements: Height: 5\' 5"  (165.1 cm) Weight: 187 lb 14.4 oz (85.231 kg) (scale b) IBW/kg (Calculated) : 61.5  Vital Signs:    Labs:  Recent Labs  03/16/16 0649 03/17/16 0327 03/17/16 1235 03/18/16 0337 03/18/16 0854  HGB 10.1* 10.1* 10.2*  --   --   HCT 31.9* 31.7* 32.6*  --   --   PLT 345 343 347  --   --   LABPROT 24.2* 28.5*  --  >90.0* 26.4*  INR 2.20* 2.73*  --  >10.00* 2.46*  CREATININE 3.38* 2.30*  --  1.99*  --     Estimated Creatinine Clearance: 41.6 mL/min (by C-G formula based on Cr of 1.99).  Assessment: 55 y/o M on warfarin PTA, here with shortness of breath/LE edema, INR is 1.9 on admit, Hgb 9.6, other labs reviewed. Was seen at warfarin clinic 4/28, last dose of warfarin 4/27.  Outpatient warfarin dose is 5 mg Mon/Wed, 7.5 mg all other days  High dose of 10mg  given 4/29 AM. INR is therapeutic at 2.46 (previous INR of > 10, error)  CBC is stable. No bleeding reported.   Goal of Therapy:  INR 2-3 Monitor platelets by anticoagulation protocol: Yes   Plan:  Coumadin 7.5 mg po x 1 tonight Daily INR  Home on home dose of 5 mg MW, 7.5 mg other days  Thank you Anette Guarneri, PharmD [phone] 03/18/2016,10:35 AM

## 2016-03-19 ENCOUNTER — Telehealth: Payer: Self-pay

## 2016-03-19 NOTE — Telephone Encounter (Signed)
[facility] Post-discharge Follow-Up Phone Call:  Date of Discharge: 03/18/2016 Principal Discharge Diagnosis(es):volume overload, CKD, DM, atrial fibrillation; acute on chronic CHF, anemia - chronic disease Post-discharge Communication: Call placed to the patient.  Call Completed: Yes                    With Whom: Patient Interpreter Needed: Yes               Language/Dialect: [name] Interpreter # [phone] with [employer]     Please check all that apply:  X Patient is knowledgeable of his/her condition(s) and/or treatment. X  Patient is caring for self at home.  - His son can assist him if needed ? Patient is receiving assist at home from family and/or caregiver. Family and/or caregiver is knowledgeable of patient's condition(s) and/or treatment. ? Patient is receiving home health services. If so, name of agency.     Medication Reconciliation:  ? Medication list reviewed with patient. X  Patient obtained all discharge medications. If not, why? He said that he has all of the medications from the doctor. He also said that he has been checking his blood sugars but was not able to tell this CM what the results were this morning. He said that he is not able to read the discharge medication list as it is in [place] but he will have his kids read it for him. This CM inquired about his coumadin dose that he is taking and instructed him to have the children read the instructions and compare to the medication bottles ( doses and instructions ) to the written medication instructions from the hospital and the patient said that he would. This CM also told him that he could bring the medication to the clinic and have his questions answered and he said that he understood. He stated that he did not have any questions at this time.    Activities of Daily Living:  X  Independent ? Needs assist (describe; ? home DME used) ? Total Care (describe, ? home DME used)   Community  resources in place for patient:  X  None  ? Home Health/Home DME ? Assisted Living ? Support Group        Questions/Concerns discussed: He said that he is feeling " okay."  He denied any difficulty breathing, headaches, problems with his eyesight, or signs of bleeding. He said that he is aware of his appointment at [facility] next week, 03/25/16 @ 1430.  No problems /questions reported.

## 2016-03-20 MED FILL — WARFARIN NA 2.5 MG TAB: 2.5 | 57 days supply | Qty: 60 | Fill #0

## 2016-03-20 MED FILL — LACTULOSE 10 GM/15 ML SOLN: 10 | 8 days supply | Qty: 240 | Fill #0

## 2016-03-21 ENCOUNTER — Telehealth: Payer: Self-pay

## 2016-03-21 NOTE — Telephone Encounter (Signed)
This Case Manager placed call using [employer] (Interpreter [phone]) to check status and to determine if patient's children reviewed discharge medication list with him as patient previously indicated he could not read discharge medication list because it is in [place]. Call placed to [phone]; unable to reach patient. Voicemail left informing patient that he would be called again at a later time.

## 2016-03-24 ENCOUNTER — Telehealth: Payer: Self-pay

## 2016-03-24 MED FILL — COLCHICINE 0.6 MG TABLET: 0.6 | 15 days supply | Qty: 30 | Fill #1

## 2016-03-24 NOTE — Telephone Encounter (Signed)
Attempted to contact the patient to check on his status and to remind him of his appointment at [facility] tomorrow, 03/25/16 @ 1430. Call placed to # [phone] (H) with the assistance of [name] interpreter # [phone] with [employer].  A voicemail message was left notifying the patient that another call would be made at a later time.

## 2016-03-25 ENCOUNTER — Telehealth: Payer: Self-pay

## 2016-03-25 ENCOUNTER — Ambulatory Visit: Payer: Medicare Other | Admitting: Family Medicine

## 2016-03-25 NOTE — Telephone Encounter (Signed)
Call placed to the patient with the assistance of [place] interpreter # [phone] with [employer].  Attempted to confirm the patient's appointment at [facility] today and he stated that he is not able to come because he does not have any transportation. This CM offered to have the [facility] arrange for cab transportation to the clinic and he refused and stated that he would prefer to re-schedule the appointment.  The appointment was rescheduled for 04/01/16 @ 1015.  He was very appreciative of the new appointment and stated that at this time his  " condition is okay right now."  He said that his family was able to review his medications with him and he does not have any questions/conerns at this time.

## 2016-03-26 MED FILL — POTASSIUM CL ER 20 MEQ TAB: 20 | 30 days supply | Qty: 30 | Fill #0

## 2016-03-31 ENCOUNTER — Telehealth: Payer: Self-pay

## 2016-03-31 NOTE — Telephone Encounter (Signed)
Call placed to the patient with the assistance of [place] interpreter # [phone] with [employer].  The patient stated that he is feeling " fine."  He stated that he has not had any problems breathing and he reported no bleeding  He said that his has been checking his blood sugars and noted that his blood sugar this morning was 130. Reminded him of his appointment tomorrow , 04/01/16 @ 1015. He stated that he would be there and his son would be able to drive him to the clinic.  Also reminded him to bring his medications and his blood sugar log with him to the appointment and he stated that he would. No problems/ questions reported at this time.

## 2016-04-01 ENCOUNTER — Encounter: Payer: Self-pay | Admitting: Family Medicine

## 2016-04-01 ENCOUNTER — Ambulatory Visit: Payer: Medicare Other | Attending: Family Medicine | Admitting: Family Medicine

## 2016-04-01 VITALS — BP 112/79 | HR 91 | Temp 98.2°F | Resp 17 | Ht 64.0 in | Wt 186.0 lb

## 2016-04-01 DIAGNOSIS — I2510 Atherosclerotic heart disease of native coronary artery without angina pectoris: Secondary | ICD-10-CM | POA: Insufficient documentation

## 2016-04-01 DIAGNOSIS — K7469 Other cirrhosis of liver: Secondary | ICD-10-CM

## 2016-04-01 DIAGNOSIS — Z7901 Long term (current) use of anticoagulants: Secondary | ICD-10-CM | POA: Diagnosis not present

## 2016-04-01 DIAGNOSIS — N184 Chronic kidney disease, stage 4 (severe): Secondary | ICD-10-CM | POA: Diagnosis not present

## 2016-04-01 DIAGNOSIS — Z794 Long term (current) use of insulin: Secondary | ICD-10-CM | POA: Diagnosis not present

## 2016-04-01 DIAGNOSIS — M109 Gout, unspecified: Secondary | ICD-10-CM

## 2016-04-01 DIAGNOSIS — I272 Other secondary pulmonary hypertension: Secondary | ICD-10-CM | POA: Insufficient documentation

## 2016-04-01 DIAGNOSIS — Z952 Presence of prosthetic heart valve: Secondary | ICD-10-CM | POA: Diagnosis not present

## 2016-04-01 DIAGNOSIS — K219 Gastro-esophageal reflux disease without esophagitis: Secondary | ICD-10-CM | POA: Diagnosis not present

## 2016-04-01 DIAGNOSIS — I482 Chronic atrial fibrillation, unspecified: Secondary | ICD-10-CM

## 2016-04-01 DIAGNOSIS — Z9889 Other specified postprocedural states: Secondary | ICD-10-CM | POA: Insufficient documentation

## 2016-04-01 DIAGNOSIS — I130 Hypertensive heart and chronic kidney disease with heart failure and stage 1 through stage 4 chronic kidney disease, or unspecified chronic kidney disease: Secondary | ICD-10-CM | POA: Insufficient documentation

## 2016-04-01 DIAGNOSIS — E1121 Type 2 diabetes mellitus with diabetic nephropathy: Secondary | ICD-10-CM | POA: Insufficient documentation

## 2016-04-01 DIAGNOSIS — G4733 Obstructive sleep apnea (adult) (pediatric): Secondary | ICD-10-CM | POA: Insufficient documentation

## 2016-04-01 DIAGNOSIS — I10 Essential (primary) hypertension: Secondary | ICD-10-CM

## 2016-04-01 DIAGNOSIS — I5022 Chronic systolic (congestive) heart failure: Secondary | ICD-10-CM

## 2016-04-01 DIAGNOSIS — J45909 Unspecified asthma, uncomplicated: Secondary | ICD-10-CM | POA: Diagnosis not present

## 2016-04-01 DIAGNOSIS — E785 Hyperlipidemia, unspecified: Secondary | ICD-10-CM | POA: Insufficient documentation

## 2016-04-01 DIAGNOSIS — I480 Paroxysmal atrial fibrillation: Secondary | ICD-10-CM | POA: Diagnosis not present

## 2016-04-01 DIAGNOSIS — Z87891 Personal history of nicotine dependence: Secondary | ICD-10-CM | POA: Insufficient documentation

## 2016-04-01 MED ORDER — COLCHICINE 0.6 MG PO TABS: ORAL_TABLET | ORAL | Status: DC

## 2016-04-01 MED ORDER — DIGOXIN 125 MCG PO TABS: 0.1250 mg | ORAL_TABLET | Freq: Every day | ORAL | Status: DC

## 2016-04-01 MED ORDER — ACETAMINOPHEN-CODEINE #3 300-30 MG PO TABS: 1.0000 | ORAL_TABLET | Freq: Three times a day (TID) | ORAL | Status: DC | PRN

## 2016-04-01 MED ORDER — PREDNISONE 20 MG PO TABS: 20.0000 mg | ORAL_TABLET | Freq: Every day | ORAL | Status: DC

## 2016-04-01 MED FILL — ACETAMINOPHEN/COD #3 TABLET: 300-30 | 20 days supply | Qty: 60 | Fill #0

## 2016-04-01 MED FILL — predniSONE 20 MG TABS: 20 | 5 days supply | Qty: 5 | Fill #0

## 2016-04-01 MED FILL — DIGITEK 125 MCG TABLET: 125 | 30 days supply | Qty: 30 | Fill #0

## 2016-04-01 MED FILL — COLCHICINE 0.6 MG TABLET: 0.6 | 10 days supply | Qty: 30 | Fill #0

## 2016-04-01 NOTE — Progress Notes (Signed)
Knee pain  Pain scale #8 No tobacco user  No suicidal thought in the past two weeks

## 2016-04-01 NOTE — Progress Notes (Signed)
[place]   Date of telephone encounter: 03/19/16  Admit date: 03/14/16 Discharge date: 03/18/16  Subjective:    Patient ID: Michael [name], male    DOB: Jun 26, 1961, 55 y.o.   MRN: [id number]  HPI He is a 55 y.o. male seen with the aid of the language line with a history of Type 2DM (A1c 6.3), hypertension, hyperlipidemia, mitral valve replacement with porcine valve, tricuspid valve repair, atrial fibrillation (on anticoagulation with Coumadin),Chronic systolic heartfailure  (EF 40 -45% from 01/2016), CKD stage IV, Gout here for follow-up of the transitional care clinic.   He was recently hospitalized for acute on chronic CHF secondary to fluid overload as he had been noncompliant with his medications. He was found to be in A. fib with tachycardia in the 140s; chest x-ray revealed cardiomegaly and pulmonary vascular, congestion. He received diuresis with resulting improvement in his symptoms after which he was subsequently discharged.  Interval history: Today he complains of right knee and right foot pain and swelling for the last few days and informs me he has run out of his "Gout medicine". States his breathing is good and he feels better than prior to hospitalization. I have reviewed his medications personally and he does not have digoxin and colchicine.  Past Medical History  Diagnosis Date  . Coronary artery disease   . Diabetes mellitus without complication ([place])   . Hypertension   . Chronic systolic CHF (congestive heart failure) (HCC)     a. prior EF normal; b. Echo 10/16: Mild LVH, EF 30-35%, mitral valve bioprosthesis present without evidence of stenosis or regurgitation, severe LAE, mild RVE with mild to moderately reduced RVSF, moderate RAE  . 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 H/O tricuspid valve repair 2012  . Paroxysmal atrial fibrillation (HCC)     s/p Maze procedure at time of MVR in 2011  . Diabetes mellitus type 2 in obese ([place])    . Obstructive sleep apnea   . Chronic kidney disease (CKD), stage IV (severe) ([place])   . Anemia of chronic disease   . Pulmonary HTN ([place])   . History of echocardiogram     E[facility] 4/12: mod LVH, EF 60-65%, mod LAE, tissue MVR ok, mod TR, mod pulmo HTN with RVSP 48 mmHg  . Asthma   . GERD (gastroesophageal reflux disease)     Past Surgical History  Procedure Laterality Date  . Cardiac surgery      Social History   Social History  . Marital Status: Single    Spouse Name: N/A  . Number of Children: N/A  . Years of Education: N/A   Occupational History  . Not on file.   Social History Main Topics  . Smoking status: Former Research scientist (life sciences)  . Smokeless tobacco: Not on file  . Alcohol Use: No  . Drug Use: No  . Sexual Activity: Not on file   Other Topics Concern  . Not on file   Social History Narrative    No Known Allergies    Review of Systems  Constitutional: Negative for activity change and appetite change.  HENT: Negative for sinus pressure and sore throat.   Eyes: Negative for visual disturbance.  Respiratory: Negative for cough, chest tightness and shortness of breath.   Cardiovascular: Negative for chest pain and leg swelling.  Gastrointestinal: Negative for abdominal pain, diarrhea, constipation and abdominal distention.  Endocrine: Negative.   Genitourinary: Negative for dysuria.  Musculoskeletal:  See history of present illness  Skin: Negative for rash.  Allergic/Immunologic: Negative.   Neurological: Negative for weakness, light-headedness and numbness.  Psychiatric/Behavioral: Negative for suicidal ideas and dysphoric mood.       Objective: Filed Vitals:   04/01/16 1011  BP: 112/79  Pulse: 91  Temp: 98.2 F (36.8 C)  Resp: 17  Height: 5\' 4"  (1.626 m)  Weight: 186 lb (84.369 kg)  SpO2: 98%      Physical Exam  Constitutional: He is oriented to person, place, and time. He appears well-developed and well-nourished.  Eyes:  Scleral icterus is present.  Neck: JVD present.  Cardiovascular: Normal rate, normal heart sounds and intact distal pulses.  An irregularly irregular rhythm present.  No murmur heard. Pulmonary/Chest: Effort normal and breath sounds normal. He has no wheezes. He has no rales. He exhibits no tenderness.  Abdominal: Soft. Bowel sounds are normal. He exhibits no distension and no mass. There is no tenderness.  Musculoskeletal: He exhibits edema (right knee and right foot edema with associated erythema) and tenderness (Tenderness to palpation of all the toes of the right foot and also to palpation of the knee and associated tenderness and range of motion).  Neurological: He is alert and oriented to person, place, and time.     CMP Latest Ref Rng 03/18/2016 03/17/2016 03/16/2016  Glucose 65 - 99 mg/dL 119(H) 113(H) 158(H)  BUN 6 - 20 mg/dL 42(H) 49(H) 63(H)  Creatinine 0.61 - 1.24 mg/dL 1.99(H) 2.30(H) 3.38(H)  Sodium 135 - 145 mmol/L 138 141 137  Potassium 3.5 - 5.1 mmol/L 3.7 3.4(L) 3.8  Chloride 101 - 111 mmol/L 98(L) 101 104  CO2 22 - 32 mmol/L 29 29 22   Calcium 8.9 - 10.3 mg/dL 9.1 9.6 9.1  Total Protein 6.5 - 8.1 g/dL - 7.2 7.3  Total Bilirubin 0.3 - 1.2 mg/dL - 1.8(H) 1.9(H)  Alkaline Phos 38 - 126 U/L - 106 103  AST 15 - 41 U/L - 26 25  ALT 17 - 63 U/L - 14(L) 14(L)         Assessment & Plan:  1. Type 2 diabetes mellitus with diabetic nephropathy, with long-term current use of insulin (HCC) A1c of 6.3 from 02/2016 No episodes of hypoglycemia Continue Lantus 20 units at bedtime - Glucose (CBG)  2. Gout Current acute flare Placed on Tylenol with codeine as tramadol is ineffective He does not have colchicine with his medications which I have refilled Placed on short course prednisone-caution exercise due to recent fall fluid overload given history of congestive heart failure   3. Chronic atrial fibrillation with RVR (HCC) Currently anticoagulated with Coumadin. Last INR was  2.46 on 03/18/16 INR is managed by the Coumadin clinic; next appointment in 3 days He does not have digoxin with his medications which I have refilled today  4. Chronic systolic CHF (congestive heart failure) (HCC) EF 40-45% from 2-D echo of 01/2016 Asymptomatic even though the JVD is mildly elevated. Compliance emphasized. Limit daily fluids to less than 2 L per day, low-sodium, cardiac diet - furosemide (LASIX) 80 MG tablet; Take 1 tablet (80 mg total) by mouth 2 (two) times daily.  Dispense: 60 tablet; Refill: 3  5. CKD (chronic kidney disease), stage IV ([place]) Followed by [facility]. Avoid nephrotoxic agents Will need to manage deteriorating renal function  Vs maintaining euvolemic from CHF standpoint   6. Essential hypertension controlled - metoprolol (LOPRESSOR) 100 MG tablet; Take 1 tablet (100 mg total) by mouth 2 (two) times  daily.  Dispense: 60 tablet; Refill: 3  7. Gastroesophageal reflux disease without esophagitis Controlled   This note has been created with Surveyor, quantity. Any transcriptional errors are unintentional.

## 2016-04-01 NOTE — Patient Instructions (Signed)

## 2016-04-04 ENCOUNTER — Ambulatory Visit (INDEPENDENT_AMBULATORY_CARE_PROVIDER_SITE_OTHER): Payer: Medicare Other | Admitting: *Deleted

## 2016-04-04 ENCOUNTER — Telehealth: Payer: Self-pay | Admitting: Interventional Cardiology

## 2016-04-04 DIAGNOSIS — Z5181 Encounter for therapeutic drug level monitoring: Secondary | ICD-10-CM

## 2016-04-04 DIAGNOSIS — I4891 Unspecified atrial fibrillation: Secondary | ICD-10-CM | POA: Diagnosis not present

## 2016-04-04 DIAGNOSIS — I059 Rheumatic mitral valve disease, unspecified: Secondary | ICD-10-CM

## 2016-04-04 DIAGNOSIS — I079 Rheumatic tricuspid valve disease, unspecified: Secondary | ICD-10-CM | POA: Diagnosis not present

## 2016-04-04 LAB — PROTIME-INR
INR: 6.95 — AB (ref 0.00–1.49)
PROTHROMBIN TIME: 57.6 s — AB (ref 11.6–15.2)

## 2016-04-04 LAB — POCT INR: INR: >8

## 2016-04-04 NOTE — Telephone Encounter (Signed)
New message     The lab tech is calling results in INR 6.95

## 2016-04-04 NOTE — Telephone Encounter (Signed)
Received direct call from lab. Results given to Fuller Canada, PharmD in coumadin clinic

## 2016-04-09 MED FILL — METOPROLOL TARTRATE 100 MG: 100 | 30 days supply | Qty: 60 | Fill #1

## 2016-04-09 MED FILL — ?ALLOPURINOL 100MG TABLET: 100 | 30 days supply | Qty: 60 | Fill #0

## 2016-04-09 MED FILL — predniSONE 20 MG TABS: 20 | 3 days supply | Qty: 9 | Fill #0

## 2016-04-09 MED FILL — OMEPRAZOLE DR 20 MG CAPSULE: 20 | 30 days supply | Qty: 30 | Fill #1

## 2016-04-10 ENCOUNTER — Telehealth: Payer: Self-pay

## 2016-04-10 ENCOUNTER — Other Ambulatory Visit: Payer: Self-pay | Admitting: Family Medicine

## 2016-04-10 MED ORDER — FOLIC ACID 1 MG PO TABS: 1.0000 mg | ORAL_TABLET | Freq: Every day | ORAL | Status: DC

## 2016-04-10 NOTE — Telephone Encounter (Signed)
This Case Manager placed call to patient with assist of [place] ([place] Interpreter # [phone]) to check patient's status. Patient indicated he was "doing fine" and had no complaints or questions regarding his health. Patient indicated he picked up medications Dr. Jarold Song prescribed at last appointment, and he indicated he was being compliant with all daily medications. Patient denied questions about his medications. Patient indicated he was adhering to a low sodium diet and limiting fluids to less than 2L/day. Patient reminded of his Coumadin clinic appointment on 04/11/16 at 1415 and appointment on 04/15/16 at 1115 with Dr. Jarold Song. Patient verbalized understanding and aware of appointments. Reminded patient to keep record of his blood glucose readings and bring log as well as all medications to his appointment on 04/15/16 with Dr. Jarold Song. Patient verbalized understanding and appreciative of call. No additional needs/concerns identified.

## 2016-04-11 ENCOUNTER — Ambulatory Visit (INDEPENDENT_AMBULATORY_CARE_PROVIDER_SITE_OTHER): Payer: Medicare Other | Admitting: Pharmacist

## 2016-04-11 DIAGNOSIS — Z5181 Encounter for therapeutic drug level monitoring: Secondary | ICD-10-CM | POA: Diagnosis not present

## 2016-04-11 DIAGNOSIS — I059 Rheumatic mitral valve disease, unspecified: Secondary | ICD-10-CM | POA: Diagnosis not present

## 2016-04-11 DIAGNOSIS — I4891 Unspecified atrial fibrillation: Secondary | ICD-10-CM

## 2016-04-11 DIAGNOSIS — I079 Rheumatic tricuspid valve disease, unspecified: Secondary | ICD-10-CM | POA: Diagnosis not present

## 2016-04-11 LAB — POCT INR: INR: 1.7

## 2016-04-15 ENCOUNTER — Ambulatory Visit: Payer: Medicare Other | Admitting: Family Medicine

## 2016-04-21 ENCOUNTER — Telehealth: Payer: Self-pay

## 2016-04-21 NOTE — Telephone Encounter (Signed)
Call placed to the patient with the assistance of [name] interpreter - # [phone] with [employer].  The patient confirmed his appointment for tomorrow, 04/22/16 @ 0900 and stated that he has transportation to the clinic.  Reminded him to bring his blood sugar log and his medications to his appointment and he stated that he would. He did not report any problems or signs/symptoms of bleeding. He said that he has all of his medications and has been taking them.  He said that he did not have any questions at this time.  He was very appreciative of the call.

## 2016-04-22 ENCOUNTER — Encounter: Payer: Self-pay | Admitting: Family Medicine

## 2016-04-22 ENCOUNTER — Other Ambulatory Visit: Payer: Self-pay | Admitting: Internal Medicine

## 2016-04-22 ENCOUNTER — Other Ambulatory Visit: Payer: Self-pay | Admitting: Family Medicine

## 2016-04-22 ENCOUNTER — Ambulatory Visit: Payer: Medicare Other | Attending: Family Medicine | Admitting: Family Medicine

## 2016-04-22 ENCOUNTER — Telehealth: Payer: Self-pay

## 2016-04-22 VITALS — BP 117/76 | HR 97 | Temp 98.0°F | Resp 18 | Ht 64.0 in | Wt 183.4 lb

## 2016-04-22 DIAGNOSIS — E1121 Type 2 diabetes mellitus with diabetic nephropathy: Secondary | ICD-10-CM | POA: Diagnosis not present

## 2016-04-22 DIAGNOSIS — Z794 Long term (current) use of insulin: Secondary | ICD-10-CM

## 2016-04-22 DIAGNOSIS — I10 Essential (primary) hypertension: Secondary | ICD-10-CM

## 2016-04-22 DIAGNOSIS — I4891 Unspecified atrial fibrillation: Secondary | ICD-10-CM | POA: Diagnosis not present

## 2016-04-22 DIAGNOSIS — M109 Gout, unspecified: Secondary | ICD-10-CM | POA: Diagnosis not present

## 2016-04-22 DIAGNOSIS — N184 Chronic kidney disease, stage 4 (severe): Secondary | ICD-10-CM

## 2016-04-22 LAB — GLUCOSE, POCT (MANUAL RESULT ENTRY): POC Glucose: 93 mg/dl (ref 70–99)

## 2016-04-22 MED ORDER — FOLIC ACID 1 MG PO TABS: 1.0000 mg | ORAL_TABLET | Freq: Every day | ORAL | Status: DC

## 2016-04-22 MED ORDER — ALLOPURINOL 100 MG PO TABS: 100.0000 mg | ORAL_TABLET | Freq: Two times a day (BID) | ORAL | Status: DC

## 2016-04-22 MED FILL — ALLOPURINOL 100 MG TABLET: 100 | 30 days supply | Qty: 60 | Fill #0

## 2016-04-22 MED FILL — FOLIC ACID 1 MG TABLET: 1 | 30 days supply | Qty: 30 | Fill #0

## 2016-04-22 MED FILL — POTASSIUM CL ER 20 MEQ TAB: 20 | 30 days supply | Qty: 30 | Fill #1

## 2016-04-22 NOTE — Progress Notes (Signed)
Pt here for F/U for gout. Pain reported in right foot rated at a 4. Pt reports "it just hurts". Pain has been present for less than 7 days. Pain comes and go. Pt has been taken allopurinol and colchicine and states that it has been helping. Pt has not taken his medications today nor has he eaten. Pt needs refills on folic acid, warfarin, allopurinol, prednisone, and vitamin d. Pt CBG is 93.

## 2016-04-22 NOTE — Telephone Encounter (Signed)
Call placed to the patient at the request of Dr Jarold Song who noted that the patient is now requesting tramadol. She noted that he did not report any need for tramadol when he was at the clinic for his appointment today. He needs to be informed that the tramadol is not for long-term use.  The call was placed with the assistance of [name] interpreter # [phone] with [employer]. Informed the patient that the tramadol is not for long term use and he had not mentioned any need for tramadol when he was at his appointment at [facility] today. He said that he understood. Instructed him to contact the [facility] to schedule an appointment if he experiences any increase in pain and needs to see his provider. He again stated that he understood and does did not have any further questions.

## 2016-04-22 NOTE — Progress Notes (Signed)
Subjective:  Patient ID: Michael [name], male    DOB: [birth year]  Age: 55 y.o. MRN: [id number]  CC: Follow-up   HPI Michael [name] is a 55 y.o. male with a history of Type 2DM (A1c 6.3), hypertension, hyperlipidemia, mitral valve replacement with porcine valve, tricuspid valve repair, atrial fibrillation (on anticoagulation with Coumadin),Chronic systolic heartfailure  (EF 40 -45% from 01/2016), CKD stage IV, Gout here for follow-up visit  He had a gout flare at his last office visit and had not been taking his allopurinol or colchicine which were refilled at that visit and he reports resolution of the right knee pain and right foot pain. He has been compliant with his antihypertensives and his Gout medications. He remains on Coumadin which is monitored by the Coumadin clinic.  Denies chest pains or shortness of breath today.   Outpatient Prescriptions Prior to Visit  Medication Sig Dispense Refill  . acetaminophen (TYLENOL) 325 MG tablet Take 650 mg by mouth every 6 (six) hours as needed for headache (pain). Reported on 01/10/2016    . acetaminophen-codeine (TYLENOL #3) 300-30 MG tablet Take 1 tablet by mouth every 8 (eight) hours as needed for moderate pain. 60 tablet 0  . Blood Glucose Monitoring Suppl (ACCU-CHEK AVIVA) device Use as Instructed 3 times daily 1 each 0  . colchicine 0.6 MG tablet Take 2 tablets (1.2 mg) at the onset of a gout flare, may repeat 1 tablet (0.6 mg) in one hour if symptoms persist. 30 tablet 2  . digoxin (LANOXIN) 0.125 MG tablet Take 1 tablet (0.125 mg total) by mouth daily. 30 tablet 11  . diphenhydramine-acetaminophen (TYLENOL PM) 25-500 MG TABS tablet Take 1 tablet by mouth daily as needed. For sleep    . furosemide (LASIX) 80 MG tablet Take 1 tablet (80 mg total) by mouth 2 (two) times daily. 60 tablet 3  . glucose blood (ACCU-CHEK AVIVA) test strip Use 3 times daily as directed. 100 each 5  . insulin glargine (LANTUS) 100 UNIT/ML injection Inject  0.15 mLs (15 Units total) into the skin at bedtime. 10 mL 0  . isosorbide mononitrate (IMDUR) 30 MG 24 hr tablet Take 0.5 tablets (15 mg total) by mouth daily. (Patient taking differently: Take 15 mg by mouth daily with supper. ) 30 tablet 11  . lactulose (CHRONULAC) 10 GM/15ML solution Take 30 mLs (20 g total) by mouth daily. 240 mL 0  . Lancet Devices (ACCU-CHEK SOFTCLIX) lancets Use as instructed 3 times daily 1 each 5  . lidocaine (XYLOCAINE) 2 % solution Use as directed 10 mLs in the mouth or throat every 6 (six) hours as needed for mouth pain. 100 mL 0  . metoprolol (LOPRESSOR) 100 MG tablet Take 1 tablet (100 mg total) by mouth 2 (two) times daily. 60 tablet 3  . omeprazole (PRILOSEC) 20 MG capsule Take 1 capsule (20 mg total) by mouth daily. 30 capsule 3  . potassium chloride SA (K-DUR,KLOR-CON) 20 MEQ tablet Take 1 tablet (20 mEq total) by mouth daily. 30 tablet 11  . Vitamin D, Ergocalciferol, (DRISDOL) 50000 UNITS CAPS capsule Take 50,000 Units by mouth every 7 (seven) days. Reported on 04/22/2016    . warfarin (COUMADIN) 2.5 MG tablet Take 1 tablet (2.5 mg total) by mouth daily. Please take 5 mg on Monday and Wednesday and then 7.5 mg on every other day which is Tuesday, Thursday, Friday, Saturday and Sunday. 60 tablet 0  . allopurinol (ZYLOPRIM) 100 MG tablet Take 1 tablet (100 mg total) by  mouth 2 (two) times daily. 60 tablet 2  . folic acid (FOLVITE) 1 MG tablet Take 1 tablet (1 mg total) by mouth daily. 30 tablet 1  . predniSONE (DELTASONE) 20 MG tablet Take 1 tablet (20 mg total) by mouth daily with breakfast. 5 tablet 0  . ferrous sulfate 325 (65 FE) MG tablet Take 1 tablet (325 mg total) by mouth 2 (two) times daily with a meal. 60 tablet 0   No facility-administered medications prior to visit.    ROS Review of Systems  Constitutional: Negative for activity change and appetite change.  HENT: Negative for sinus pressure and sore throat.   Eyes: Negative for visual disturbance.    Respiratory: Negative for cough, chest tightness and shortness of breath.   Cardiovascular: Negative for chest pain and leg swelling.  Gastrointestinal: Negative for abdominal pain, diarrhea, constipation and abdominal distention.  Endocrine: Negative.   Genitourinary: Negative for dysuria.  Musculoskeletal: Negative for myalgias and joint swelling.  Skin: Negative for rash.  Allergic/Immunologic: Negative.   Neurological: Negative for weakness, light-headedness and numbness.  Psychiatric/Behavioral: Negative for suicidal ideas and dysphoric mood.    Objective:  BP 117/76 mmHg  Pulse 97  Temp(Src) 98 F (36.7 C) (Oral)  Resp 18  Ht 5\' 4"  (1.626 m)  Wt 183 lb 6.4 oz (83.19 kg)  BMI 31.47 kg/m2  SpO2 96%  BP/Weight 04/22/2016 [id number]  Systolic BP [id number]  Diastolic BP 76 79 86  Wt. (Lbs) 183.4 186 187.9  BMI 31.47 31.91 -      Physical Exam  Constitutional: He is oriented to person, place, and time. He appears well-developed and well-nourished.  Cardiovascular: Normal rate, normal heart sounds and intact distal pulses.   No murmur heard. Pulmonary/Chest: Effort normal and breath sounds normal. He has no wheezes. He has no rales. He exhibits no tenderness.  Abdominal: Soft. Bowel sounds are normal. He exhibits no distension and no mass. There is no tenderness.  Musculoskeletal: Normal range of motion.  Neurological: He is alert and oriented to person, place, and time.     Assessment & Plan:   1. Type 2 diabetes mellitus with diabetic nephropathy, with long-term current use of insulin (HCC) Controlled with A1c of 7.3 Continue medications - Ambulatory referral to Ophthalmology  2. Essential hypertension Controlled  3. Gout without tophus, unspecified cause, unspecified chronicity, unspecified site Acute flare resolved - allopurinol (ZYLOPRIM) 100 MG tablet; Take 1 tablet (100 mg total) by mouth 2 (two) times daily.  Dispense: 60 tablet; Refill: 3  4.  Atrial fibrillation with RVR (HCC) Currently on rate control with metoprolol, digoxin and anticoagulation with Coumadin.  5. CKD (chronic kidney disease), stage IV ([place]) Followed by [place] [facility]   Meds ordered this encounter  Medications  . folic acid (FOLVITE) 1 MG tablet    Sig: Take 1 tablet (1 mg total) by mouth daily.    Dispense:  30 tablet    Refill:  3  . allopurinol (ZYLOPRIM) 100 MG tablet    Sig: Take 1 tablet (100 mg total) by mouth 2 (two) times daily.    Dispense:  60 tablet    Refill:  3    Follow-up: Return in about 6 weeks (around 06/03/2016) for coordination of care.   [name] MD

## 2016-04-22 NOTE — Patient Instructions (Signed)

## 2016-04-22 NOTE — Telephone Encounter (Signed)
Please inform him Tramadol is not a chronic medication and should only be taken with his Gout flares. He informed today his Gout flare had resolved.

## 2016-04-23 NOTE — Telephone Encounter (Signed)
Please defer to PCP

## 2016-04-23 NOTE — Telephone Encounter (Signed)
Ok to refill or defer to pcp?

## 2016-04-25 ENCOUNTER — Ambulatory Visit (INDEPENDENT_AMBULATORY_CARE_PROVIDER_SITE_OTHER): Payer: Medicare Other | Admitting: Pharmacist

## 2016-04-25 DIAGNOSIS — I079 Rheumatic tricuspid valve disease, unspecified: Secondary | ICD-10-CM | POA: Diagnosis not present

## 2016-04-25 DIAGNOSIS — I059 Rheumatic mitral valve disease, unspecified: Secondary | ICD-10-CM

## 2016-04-25 DIAGNOSIS — I4891 Unspecified atrial fibrillation: Secondary | ICD-10-CM | POA: Diagnosis not present

## 2016-04-25 DIAGNOSIS — Z5181 Encounter for therapeutic drug level monitoring: Secondary | ICD-10-CM

## 2016-04-25 LAB — POCT INR: INR: 2.4

## 2016-04-30 MED FILL — COLCHICINE 0.6 MG TABLET: 0.6 | 10 days supply | Qty: 30 | Fill #1

## 2016-04-30 MED FILL — DIGITEK 125 MCG TABLET: 125 | 30 days supply | Qty: 30 | Fill #1

## 2016-05-09 ENCOUNTER — Other Ambulatory Visit: Payer: Self-pay | Admitting: Family Medicine

## 2016-05-09 MED FILL — OMEPRAZOLE DR 20 MG CAPSULE: 20 | 30 days supply | Qty: 30 | Fill #2

## 2016-05-09 MED FILL — METOPROLOL TARTRATE 100 MG: 100 | 30 days supply | Qty: 60 | Fill #2

## 2016-05-12 NOTE — Telephone Encounter (Signed)
Tylenol 3 should only be taken for pain due to acute gout flare and it is not a chronic medication

## 2016-05-22 ENCOUNTER — Ambulatory Visit (INDEPENDENT_AMBULATORY_CARE_PROVIDER_SITE_OTHER): Payer: Medicare Other | Admitting: *Deleted

## 2016-05-22 ENCOUNTER — Encounter (INDEPENDENT_AMBULATORY_CARE_PROVIDER_SITE_OTHER): Payer: Self-pay

## 2016-05-22 ENCOUNTER — Other Ambulatory Visit: Payer: Self-pay | Admitting: Family Medicine

## 2016-05-22 DIAGNOSIS — Z5181 Encounter for therapeutic drug level monitoring: Secondary | ICD-10-CM | POA: Diagnosis not present

## 2016-05-22 DIAGNOSIS — I079 Rheumatic tricuspid valve disease, unspecified: Secondary | ICD-10-CM | POA: Diagnosis not present

## 2016-05-22 DIAGNOSIS — I4891 Unspecified atrial fibrillation: Secondary | ICD-10-CM

## 2016-05-22 DIAGNOSIS — I059 Rheumatic mitral valve disease, unspecified: Secondary | ICD-10-CM | POA: Diagnosis not present

## 2016-05-22 LAB — POCT INR: INR: 4.1

## 2016-05-22 MED FILL — FOLIC ACID 1 MG TABLET: 1 | 30 days supply | Qty: 30 | Fill #1

## 2016-05-22 MED FILL — POTASSIUM CL ER 20 MEQ TAB: 20 | 30 days supply | Qty: 30 | Fill #2

## 2016-05-22 MED FILL — COLCHICINE 0.6 MG TABLET: 0.6 | 10 days supply | Qty: 30 | Fill #2

## 2016-05-26 ENCOUNTER — Other Ambulatory Visit: Payer: Self-pay | Admitting: Family Medicine

## 2016-05-26 ENCOUNTER — Telehealth: Payer: Self-pay

## 2016-05-26 NOTE — Telephone Encounter (Signed)
Pt returning phone call ° °

## 2016-05-30 NOTE — Telephone Encounter (Signed)
Called patient on 05/26/16 to advise rx available for pick up at front desk. No answer. Had technical difficulties, unable to document. Spoke to patient today. He will be in on Monday to p/u rx.

## 2016-06-01 ENCOUNTER — Emergency Department (HOSPITAL_COMMUNITY): Payer: Medicare Other

## 2016-06-01 ENCOUNTER — Encounter (HOSPITAL_COMMUNITY): Payer: Self-pay | Admitting: *Deleted

## 2016-06-01 ENCOUNTER — Observation Stay (HOSPITAL_COMMUNITY)
Admission: EM | Admit: 2016-06-01 | Discharge: 2016-06-02 | Disposition: A | Discharge status: Home / Self Care | Payer: Medicare Other | Attending: Internal Medicine | Admitting: Internal Medicine

## 2016-06-01 DIAGNOSIS — I480 Paroxysmal atrial fibrillation: Secondary | ICD-10-CM | POA: Diagnosis not present

## 2016-06-01 DIAGNOSIS — I130 Hypertensive heart and chronic kidney disease with heart failure and stage 1 through stage 4 chronic kidney disease, or unspecified chronic kidney disease: Secondary | ICD-10-CM | POA: Diagnosis not present

## 2016-06-01 DIAGNOSIS — D638 Anemia in other chronic diseases classified elsewhere: Secondary | ICD-10-CM | POA: Diagnosis not present

## 2016-06-01 DIAGNOSIS — Z6830 Body mass index (BMI) 30.0-30.9, adult: Secondary | ICD-10-CM | POA: Insufficient documentation

## 2016-06-01 DIAGNOSIS — K7460 Unspecified cirrhosis of liver: Secondary | ICD-10-CM | POA: Insufficient documentation

## 2016-06-01 DIAGNOSIS — N179 Acute kidney failure, unspecified: Secondary | ICD-10-CM | POA: Diagnosis not present

## 2016-06-01 DIAGNOSIS — E669 Obesity, unspecified: Secondary | ICD-10-CM | POA: Insufficient documentation

## 2016-06-01 DIAGNOSIS — M79604 Pain in right leg: Secondary | ICD-10-CM | POA: Insufficient documentation

## 2016-06-01 DIAGNOSIS — K625 Hemorrhage of anus and rectum: Secondary | ICD-10-CM | POA: Insufficient documentation

## 2016-06-01 DIAGNOSIS — M79662 Pain in left lower leg: Secondary | ICD-10-CM | POA: Diagnosis not present

## 2016-06-01 DIAGNOSIS — M79661 Pain in right lower leg: Secondary | ICD-10-CM | POA: Diagnosis not present

## 2016-06-01 DIAGNOSIS — D509 Iron deficiency anemia, unspecified: Secondary | ICD-10-CM | POA: Insufficient documentation

## 2016-06-01 DIAGNOSIS — K068 Other specified disorders of gingiva and edentulous alveolar ridge: Secondary | ICD-10-CM | POA: Diagnosis not present

## 2016-06-01 DIAGNOSIS — M79632 Pain in left forearm: Secondary | ICD-10-CM | POA: Insufficient documentation

## 2016-06-01 DIAGNOSIS — R791 Abnormal coagulation profile: Principal | ICD-10-CM | POA: Diagnosis present

## 2016-06-01 DIAGNOSIS — I2510 Atherosclerotic heart disease of native coronary artery without angina pectoris: Secondary | ICD-10-CM | POA: Diagnosis not present

## 2016-06-01 DIAGNOSIS — E872 Acidosis: Secondary | ICD-10-CM | POA: Diagnosis not present

## 2016-06-01 DIAGNOSIS — M79606 Pain in leg, unspecified: Secondary | ICD-10-CM

## 2016-06-01 DIAGNOSIS — Z953 Presence of xenogenic heart valve: Secondary | ICD-10-CM | POA: Insufficient documentation

## 2016-06-01 DIAGNOSIS — I272 Other secondary pulmonary hypertension: Secondary | ICD-10-CM | POA: Diagnosis not present

## 2016-06-01 DIAGNOSIS — M79605 Pain in left leg: Secondary | ICD-10-CM | POA: Diagnosis not present

## 2016-06-01 DIAGNOSIS — Z87891 Personal history of nicotine dependence: Secondary | ICD-10-CM | POA: Insufficient documentation

## 2016-06-01 DIAGNOSIS — R042 Hemoptysis: Secondary | ICD-10-CM | POA: Diagnosis not present

## 2016-06-01 DIAGNOSIS — I5022 Chronic systolic (congestive) heart failure: Secondary | ICD-10-CM | POA: Insufficient documentation

## 2016-06-01 DIAGNOSIS — Z794 Long term (current) use of insulin: Secondary | ICD-10-CM | POA: Insufficient documentation

## 2016-06-01 DIAGNOSIS — K219 Gastro-esophageal reflux disease without esophagitis: Secondary | ICD-10-CM | POA: Diagnosis not present

## 2016-06-01 DIAGNOSIS — M109 Gout, unspecified: Secondary | ICD-10-CM | POA: Insufficient documentation

## 2016-06-01 DIAGNOSIS — Z79899 Other long term (current) drug therapy: Secondary | ICD-10-CM | POA: Insufficient documentation

## 2016-06-01 DIAGNOSIS — E1122 Type 2 diabetes mellitus with diabetic chronic kidney disease: Secondary | ICD-10-CM | POA: Diagnosis not present

## 2016-06-01 DIAGNOSIS — N184 Chronic kidney disease, stage 4 (severe): Secondary | ICD-10-CM | POA: Diagnosis not present

## 2016-06-01 DIAGNOSIS — Z7901 Long term (current) use of anticoagulants: Secondary | ICD-10-CM | POA: Insufficient documentation

## 2016-06-01 DIAGNOSIS — G4733 Obstructive sleep apnea (adult) (pediatric): Secondary | ICD-10-CM | POA: Insufficient documentation

## 2016-06-01 LAB — HEPATIC FUNCTION PANEL
ALBUMIN: 4.1 g/dL (ref 3.5–5.0)
ALT: 14 U/L — ABNORMAL LOW (ref 17–63)
AST: 27 U/L (ref 15–41)
Alkaline Phosphatase: 80 U/L (ref 38–126)
BILIRUBIN DIRECT: 0.2 mg/dL (ref 0.1–0.5)
Indirect Bilirubin: 0.4 mg/dL (ref 0.3–0.9)
TOTAL PROTEIN: 7.8 g/dL (ref 6.5–8.1)
Total Bilirubin: 0.6 mg/dL (ref 0.3–1.2)

## 2016-06-01 LAB — BASIC METABOLIC PANEL
ANION GAP: 6 (ref 5–15)
BUN: 63 mg/dL — ABNORMAL HIGH (ref 6–20)
CO2: 21 mmol/L — ABNORMAL LOW (ref 22–32)
Calcium: 9.2 mg/dL (ref 8.9–10.3)
Chloride: 108 mmol/L (ref 101–111)
Creatinine, Ser: 2.93 mg/dL — ABNORMAL HIGH (ref 0.61–1.24)
GFR calc non Af Amer: 22 mL/min — ABNORMAL LOW (ref >60)
GFR, EST AFRICAN AMERICAN: 26 mL/min — AB (ref >60)
Glucose, Bld: 125 mg/dL — ABNORMAL HIGH (ref 65–99)
POTASSIUM: 4.9 mmol/L (ref 3.5–5.1)
SODIUM: 135 mmol/L (ref 135–145)

## 2016-06-01 LAB — CBC
HCT: 34.2 % — ABNORMAL LOW (ref 39.0–52.0)
HEMOGLOBIN: 11 g/dL — AB (ref 13.0–17.0)
MCH: 23.9 pg — ABNORMAL LOW (ref 26.0–34.0)
MCHC: 32.2 g/dL (ref 30.0–36.0)
MCV: 74.3 fL — ABNORMAL LOW (ref 78.0–100.0)
PLATELETS: 247 10*3/uL (ref 150–400)
RBC: 4.6 MIL/uL (ref 4.22–5.81)
RDW: 18.6 % — ABNORMAL HIGH (ref 11.5–15.5)
WBC: 6 10*3/uL (ref 4.0–10.5)

## 2016-06-01 LAB — PROTIME-INR
INR: >10 (ref 0.00–1.49)
INR: 2.03 — ABNORMAL HIGH (ref 0.00–1.49)
Prothrombin Time: 22.8 seconds — ABNORMAL HIGH (ref 11.6–15.2)

## 2016-06-01 LAB — DIGOXIN LEVEL: DIGOXIN LVL: 1.4 ng/mL (ref 0.8–2.0)

## 2016-06-01 LAB — GLUCOSE, CAPILLARY
GLUCOSE-CAPILLARY: 128 mg/dL — AB (ref 65–99)
Glucose-Capillary: 92 mg/dL (ref 65–99)

## 2016-06-01 LAB — CK: CK TOTAL: 222 U/L (ref 49–397)

## 2016-06-01 MED ORDER — INSULIN ASPART 100 UNIT/ML ~~LOC~~ SOLN: 0.0000 [IU] | Freq: Every day | SUBCUTANEOUS | Status: DC

## 2016-06-01 MED ORDER — DEXTROSE-NACL 5-0.45 % IV SOLN
INTRAVENOUS | Status: AC
  Administered 2016-06-01: 17:00:00 via INTRAVENOUS

## 2016-06-01 MED ORDER — LIDOCAINE 4 % EX CREA
TOPICAL_CREAM | Freq: Four times a day (QID) | CUTANEOUS | Status: DC | PRN
  Filled 2016-06-01: qty 5

## 2016-06-01 MED ORDER — MORPHINE SULFATE (PF) 2 MG/ML IV SOLN
1.0000 mg | INTRAVENOUS | Status: DC | PRN
  Administered 2016-06-01 – 2016-06-02 (×3): 1 mg via INTRAVENOUS
  Filled 2016-06-01 (×3): qty 1

## 2016-06-01 MED ORDER — INSULIN GLARGINE 100 UNIT/ML ~~LOC~~ SOLN
15.0000 [IU] | Freq: Every day | SUBCUTANEOUS | Status: DC
  Filled 2016-06-01 (×3): qty 0.15

## 2016-06-01 MED ORDER — ISOSORBIDE MONONITRATE ER 30 MG PO TB24
15.0000 mg | ORAL_TABLET | Freq: Every day | ORAL | Status: DC
  Administered 2016-06-01 – 2016-06-02 (×2): 15 mg via ORAL
  Filled 2016-06-01 (×2): qty 1

## 2016-06-01 MED ORDER — ENSURE ENLIVE PO LIQD
237.0000 mL | Freq: Two times a day (BID) | ORAL | Status: DC
  Administered 2016-06-02 (×2): 237 mL via ORAL

## 2016-06-01 MED ORDER — FUROSEMIDE 40 MG PO TABS
80.0000 mg | ORAL_TABLET | Freq: Two times a day (BID) | ORAL | Status: DC
  Administered 2016-06-02 (×2): 80 mg via ORAL
  Filled 2016-06-01 (×2): qty 2

## 2016-06-01 MED ORDER — LACTULOSE 10 GM/15ML PO SOLN
20.0000 g | Freq: Every day | ORAL | Status: DC
  Administered 2016-06-01 – 2016-06-02 (×2): 20 g via ORAL
  Filled 2016-06-01 (×2): qty 30

## 2016-06-01 MED ORDER — "TRANEXAMIC ACID 5% ORAL SOLUTION "
10.0000 mL | Freq: Once | ORAL | Status: DC
  Filled 2016-06-01: qty 10

## 2016-06-01 MED ORDER — SODIUM CHLORIDE 0.9% FLUSH
3.0000 mL | Freq: Two times a day (BID) | INTRAVENOUS | Status: DC
  Administered 2016-06-02: 3 mL via INTRAVENOUS

## 2016-06-01 MED ORDER — SODIUM CHLORIDE 0.9 % IV SOLN: 250.0000 mL | INTRAVENOUS | Status: DC | PRN

## 2016-06-01 MED ORDER — VITAMIN K1 10 MG/ML IJ SOLN
10.0000 mg | Freq: Once | INTRAVENOUS | Status: AC
  Administered 2016-06-01: 10 mg via INTRAVENOUS
  Filled 2016-06-01: qty 1

## 2016-06-01 MED ORDER — INSULIN ASPART 100 UNIT/ML ~~LOC~~ SOLN
0.0000 [IU] | Freq: Three times a day (TID) | SUBCUTANEOUS | Status: DC
  Administered 2016-06-02: 2 [IU] via SUBCUTANEOUS
  Administered 2016-06-02: 1 [IU] via SUBCUTANEOUS

## 2016-06-01 MED ORDER — AMINOCAPROIC ACID SOLUTION 5% (50 MG/ML)
5.0000 mL | Freq: Four times a day (QID) | ORAL | Status: DC
  Administered 2016-06-01: 5 mL via ORAL
  Filled 2016-06-01 (×2): qty 100

## 2016-06-01 MED ORDER — FERROUS SULFATE 325 (65 FE) MG PO TABS
325.0000 mg | ORAL_TABLET | Freq: Every day | ORAL | Status: DC
  Administered 2016-06-02: 325 mg via ORAL
  Filled 2016-06-01: qty 1

## 2016-06-01 MED ORDER — HYDROMORPHONE HCL 1 MG/ML IJ SOLN
0.5000 mg | Freq: Once | INTRAMUSCULAR | Status: AC
  Administered 2016-06-01: 0.5 mg via INTRAVENOUS
  Filled 2016-06-01: qty 1

## 2016-06-01 MED ORDER — SODIUM CHLORIDE 0.9% FLUSH: 3.0000 mL | INTRAVENOUS | Status: DC | PRN

## 2016-06-01 MED ORDER — DIGOXIN 125 MCG PO TABS
0.1250 mg | ORAL_TABLET | Freq: Every day | ORAL | Status: DC
  Administered 2016-06-02: 0.125 mg via ORAL
  Filled 2016-06-01: qty 1

## 2016-06-01 MED ORDER — METOPROLOL TARTRATE 100 MG PO TABS
100.0000 mg | ORAL_TABLET | Freq: Two times a day (BID) | ORAL | Status: DC
  Administered 2016-06-01 – 2016-06-02 (×2): 100 mg via ORAL
  Filled 2016-06-01 (×2): qty 1

## 2016-06-01 NOTE — ED Notes (Signed)
Pt reports bilateral lower leg pain since yesterday and left forearm pain. Unable to sleep due to pain. Denies injury.

## 2016-06-01 NOTE — H&P (Signed)
Date: 06/01/2016               Patient Name:  Michael [name] MRN: [id number]  DOB: September 17, 1961 Age / Sex: 55 y.o., male   PCP: Michael Morale, MD         Medical Service: Internal Medicine Teaching Service         Attending Physician: Dr. [name], MD    First Contact: Michael [name], MS4 Pager: [phone]  Second Contact: Dr. Randell Patient Pager: [phone]       After Hours (After 5p/  First Contact Pager: [phone]  weekends / holidays): Second Contact Pager: [phone]   Chief Complaint: spitting up blood  History of Present Illness: [name] is a 55 year old man with history of DM2, CAD, HTN, chronic systolic CHF, mitral valve replacement with porcine valve, pAF s/p MAZE (on Coumadin), OSA, CKD4, anemia of chronic disease, pHTN, asthma, GERD presenting with spitting up blood. This started two days ago. He also has noticed blood in the toilet water when he has bowel movements. His bowel movements have been smaller. He has L forearm and bilateral lateral calf pain that feels like soreness. Denies joint paint. Denies similar pain before. Denies N/V, abdominal pain, diarrhea. Denies hemoptysis. No vision changes, LH, dizziness. No dysuria or hematuria. No new medications. He has been using tylenol #3 2 q6hrs for his leg pain. He used ibuprofen last week for a brief headache that has since resolved. He took colchicine yesterday. No recent change in diet.  Last seen in coumadin clinic on 05/22/2016 with INR of 4.1. He was told to skip the dose for that day and to start taking 2x2.5mg  daily the day following. Per note on 7/6, he has both 5mg  and 2.5mg  tablets.   Meds: Current Facility-Administered Medications  Medication Dose Route Frequency Provider Last Rate Last Dose  . phytonadione (VITAMIN K) 10 mg in dextrose 5 % 50 mL IVPB  10 mg Intravenous Once Varney Biles, MD 50 mL/hr at 06/01/16 1235 10 mg at 06/01/16 1235   Current Outpatient Prescriptions  Medication Sig Dispense  Refill  . acetaminophen (TYLENOL) 325 MG tablet Take 650 mg by mouth every 6 (six) hours as needed for headache (pain). Reported on 01/10/2016    . acetaminophen-codeine (TYLENOL #3) 300-30 MG tablet TAKE 1 TABLET BY MOUTH EVERY 8 HOURS AS NEEDED FOR MODERATE PAIN. 60 tablet 0  . allopurinol (ZYLOPRIM) 100 MG tablet Take 1 tablet (100 mg total) by mouth 2 (two) times daily. 60 tablet 3  . Blood Glucose Monitoring Suppl (ACCU-CHEK AVIVA) device Use as Instructed 3 times daily 1 each 0  . colchicine 0.6 MG tablet Take 2 tablets (1.2 mg) at the onset of a gout flare, may repeat 1 tablet (0.6 mg) in one hour if symptoms persist. 30 tablet 2  . digoxin (LANOXIN) 0.125 MG tablet Take 1 tablet (0.125 mg total) by mouth daily. 30 tablet 11  . diphenhydramine-acetaminophen (TYLENOL PM) 25-500 MG TABS tablet Take 1 tablet by mouth daily as needed. Reported on 04/22/2016    . ferrous sulfate 325 (65 FE) MG tablet Take 1 tablet (325 mg total) by mouth 2 (two) times daily with a meal. 60 tablet 0  . folic acid (FOLVITE) 1 MG tablet Take 1 tablet (1 mg total) by mouth daily. 30 tablet 3  . furosemide (LASIX) 80 MG tablet Take 1 tablet (80 mg total) by mouth 2 (two) times daily. 60 tablet 3  . glucose blood (ACCU-CHEK AVIVA)  test strip Use 3 times daily as directed. 100 each 5  . insulin glargine (LANTUS) 100 UNIT/ML injection Inject 0.15 mLs (15 Units total) into the skin at bedtime. 10 mL 0  . isosorbide mononitrate (IMDUR) 30 MG 24 hr tablet Take 0.5 tablets (15 mg total) by mouth daily. (Patient taking differently: Take 15 mg by mouth daily with supper. ) 30 tablet 11  . lactulose (CHRONULAC) 10 GM/15ML solution Take 30 mLs (20 g total) by mouth daily. 240 mL 0  . Lancet Devices (ACCU-CHEK SOFTCLIX) lancets Use as instructed 3 times daily 1 each 5  . lidocaine (XYLOCAINE) 2 % solution Use as directed 10 mLs in the mouth or throat every 6 (six) hours as needed for mouth pain. 100 mL 0  . metoprolol (LOPRESSOR) 100  MG tablet Take 1 tablet (100 mg total) by mouth 2 (two) times daily. 60 tablet 3  . omeprazole (PRILOSEC) 20 MG capsule Take 1 capsule (20 mg total) by mouth daily. 30 capsule 3  . potassium chloride SA (K-DUR,KLOR-CON) 20 MEQ tablet Take 1 tablet (20 mEq total) by mouth daily. 30 tablet 11  . Vitamin D, Ergocalciferol, (DRISDOL) 50000 UNITS CAPS capsule Take 50,000 Units by mouth every 7 (seven) days. Reported on 04/22/2016    . warfarin (COUMADIN) 2.5 MG tablet Take 1 tablet (2.5 mg total) by mouth daily. Please take 5 mg on Monday and Wednesday and then 7.5 mg on every other day which is Tuesday, Thursday, Friday, Saturday and Sunday. 60 tablet 0    Allergies: Allergies as of 06/01/2016  . (No Known Allergies)   Past Medical History  Diagnosis Date  . Coronary artery disease   . Diabetes mellitus without complication ([place])   . Hypertension   . Chronic systolic CHF (congestive heart failure) (HCC)     a. prior EF normal; b. Echo 10/16: Mild LVH, EF 30-35%, mitral valve bioprosthesis present without evidence of stenosis or regurgitation, severe LAE, mild RVE with mild to moderately reduced RVSF, moderate RAE  . 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 H/O tricuspid valve repair 2012  . Paroxysmal atrial fibrillation (HCC)     s/p Maze procedure at time of MVR in 2011  . Diabetes mellitus type 2 in obese ([place])   . Obstructive sleep apnea   . Chronic kidney disease (CKD), stage IV (severe) ([place])   . Anemia of chronic disease   . Pulmonary HTN ([place])   . History of echocardiogram     Echo at [facility] in [facility] 4/12: mod LVH, EF 60-65%, mod LAE, tissue MVR ok, mod TR, mod pulmo HTN with RVSP 48 mmHg  . Asthma   . GERD (gastroesophageal reflux disease)     Family History: Denies family history of CAD, DM, cancer.  Social History: Lives by himself in [place]. He has help from his daughter in law and son. Quit tobacco last year and was previously 1 ppd for over 20 years.  Denies alcohol or drugs.   Review of Systems: A complete ROS was negative except as per HPI.   Physical Exam: Blood pressure 130/79, pulse 65, temperature 97.9 F (36.6 C), temperature source Oral, resp. rate 18, SpO2 100 %. General Apperance: NAD Head: Normocephalic, atraumatic Eyes: PERRL, EOMI, anicteric sclera Ears: Normal external ear canal Nose: Nares normal, septum midline, mucosa normal Throat: Lips, mucosa and tongue normal  Neck: Supple, trachea midline Back: No tenderness or bony abnormality  Lungs: Clear to auscultation bilaterally. No wheezes, rhonchi  or rales. Breathing comfortably Chest Wall: Nontender, no deformity Heart: Regular rate and rhythm, no murmur/rub/gallop Abdomen: Soft, nontender, nondistended, no rebound/guarding Extremities: Normal, atraumatic, warm and well perfused, no edema. Mild tenderness to palpation along lateral aspects of distal BLE and left forearm. Venous stasis changes at BLE.  Pulses: 2+ throughout Skin: No rashes or lesions Neurologic: Alert and oriented x 3. CNII-XII intact. Normal strength and sensation   CXR:  IMPRESSION: 1. Cardiac enlargement. 2. No acute findings.  Assessment & Plan by Problem: 55 year old man with history of DM2, CAD, HTN, chronic systolic CHF, mitral valve replacement with porcine valve, pAF s/p MAZE, OSA, CKD4, anemia of chronic disease, pHTN, asthma, GERD presenting with spitting up blood.  Supratherapeutic INR: He reports spitting up blood. Oral mucosal bleeding on exam. Hemodynamically stable in ED. Hgb 11. Baseline hgb 10. INR >10. CXR with cardiac enlargement but no acute findings. Differential includes interaction between warfarin and other drugs, superimposed liver disease (history of cirrhosis but LFTs within normal limits today). No malabsorption by history. No acute illnesses. No large variations of day to day vitamin K intake. -Hold warfarin -10mg  Vitamin K given by ED -If patient develops serious  bleeding, administer KCENTRA -Tranexamic acid oral rinse -Repeat INR at 2200. Consider additional low dose vitamin K PO if INR > 5  Limb pain: Could possibly be hematoma from above problem. No signs worrisome for compartment syndrome. -Morphine 1mg  q4hr prn -Topical Lidocaine   CKD4: Cr 2.93 and BUN 63 on admission. Previously BUN 42 and Cr 1.99 on 5/2. Baseline cr around 2.  Metabolic acidosis: CO2 21. No anion gap. Recheck BMP in AM.  Chronic microcytic anemia: Hgb 11. Baseline hgb 10. Ferritin 38 on 02/27/2016. -Ferrous sulfate 325mg  daily  Atrial fibrillation: rate controlled. -Continue home digoxin 0.125mg  daily  Chronic systolic CHF: Echo 123456 with LV EF 40-45%. -Lasix 80mg  PO BID -Imdur 15mg  daily -Metoprolol 100mg  BID -No ACE due to CKD  DM2: A1c 6.3 on 03/15/2016 -SSI, Lantus 15u QHS  FEN: Soft diet CODE: FULL  Dispo: Admit patient to Observation with expected length of stay less than 2 midnights.  Signed: Milagros Loll, MD 06/01/2016, 12:49 PM  Pager: [phone]

## 2016-06-01 NOTE — ED Notes (Addendum)
Attempted to give report to 5N.  

## 2016-06-01 NOTE — ED Notes (Signed)
Attempted report to 5N. °

## 2016-06-01 NOTE — ED Notes (Signed)
Informed MD about critical labs.

## 2016-06-01 NOTE — ED Provider Notes (Signed)
CSN: [id number]     Arrival date & time 06/01/16  [id number] History   First MD Initiated Contact with Patient 06/01/16 1040     Chief Complaint  Patient presents with  . Leg Pain  . Arm Pain  . Hemoptysis     (Consider location/radiation/quality/duration/timing/severity/associated sxs/prior Treatment) HPI Comments: 55 y/o male comes in with cc of leg pain, spitting blood and leg pain. He has a history of Type 2DM (A1c 6.3), hypertension, hyperlipidemia, mitral valve replacement with porcine valve, tricuspid valve repair, atrial fibrillation (on anticoagulation with Coumadin),Chronic systolic heartfailure (EF 40 -45% from 01/2016), CKD stage IV, ? Liver disease.  Pt has multiple complaints; bilateral lower extremity pain and hemoptysis. He complains of bilateral lower extremity pain worst in the R side and Lleft UE pain for the past 3 days. It is constant and throbbing on the lateral aspect of both legs. He has taken some Tylenol with no change. He also complains of bleeding for the past 3 days. He has been spitting up blood as well as noticed rectal bleeding when he wipes. Patient denies n/v, cp, sob, cough, abdominal pain, fever, and chills. Patient is currently on Coumadin for paroxysmal a-fib.  ROS 10 Systems reviewed and are negative for acute change except as noted in the HPI.     The history is provided by the patient.    Past Medical History  Diagnosis Date  . Coronary artery disease   . Diabetes mellitus without complication ([place])   . Hypertension   . Chronic systolic CHF (congestive heart failure) (HCC)     a. prior EF normal; b. Echo 10/16: Mild LVH, EF 30-35%, mitral valve bioprosthesis present without evidence of stenosis or regurgitation, severe LAE, mild RVE with mild to moderately reduced RVSF, moderate RAE  . 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 H/O tricuspid valve repair 2012  . Paroxysmal atrial fibrillation (HCC)     s/p Maze procedure at time of  MVR in 2011  . Diabetes mellitus type 2 in obese ([place])   . Obstructive sleep apnea   . Chronic kidney disease (CKD), stage IV (severe) ([place])   . Anemia of chronic disease   . Pulmonary HTN ([place])   . History of echocardiogram     Echo at [facility] in [facility] 4/12: mod LVH, EF 60-65%, mod LAE, tissue MVR ok, mod TR, mod pulmo HTN with RVSP 48 mmHg  . Asthma   . GERD (gastroesophageal reflux disease)    Past Surgical History  Procedure Laterality Date  . Cardiac surgery     Family History  Problem Relation Age of Onset  . Heart attack Neg Hx    Social History  Substance Use Topics  . Smoking status: Former Research scientist (life sciences)  . Smokeless tobacco: None  . Alcohol Use: No    Review of Systems    Allergies  Review of patient's allergies indicates no known allergies.  Home Medications   Prior to Admission medications   Medication Sig Start Date End Date Taking? Authorizing Provider  acetaminophen (TYLENOL) 325 MG tablet Take 650 mg by mouth every 6 (six) hours as needed for headache (pain). Reported on 01/10/2016    Historical Provider, MD  acetaminophen-codeine (TYLENOL #3) 300-30 MG tablet TAKE 1 TABLET BY MOUTH EVERY 8 HOURS AS NEEDED FOR MODERATE PAIN. 05/26/16   [name], MD  allopurinol (ZYLOPRIM) 100 MG tablet Take 1 tablet (100 mg total) by mouth 2 (two) times daily. 04/22/16   [name],  MD  Blood Glucose Monitoring Suppl (ACCU-CHEK AVIVA) device Use as Instructed 3 times daily 01/24/16   [name], MD  colchicine 0.6 MG tablet Take 2 tablets (1.2 mg) at the onset of a gout flare, may repeat 1 tablet (0.6 mg) in one hour if symptoms persist. 04/01/16   [name], MD  digoxin (LANOXIN) 0.125 MG tablet Take 1 tablet (0.125 mg total) by mouth daily. 04/01/16   [name], MD  diphenhydramine-acetaminophen (TYLENOL PM) 25-500 MG TABS tablet Take 1 tablet by mouth daily as needed. Reported on 04/22/2016    Historical Provider, MD  ferrous sulfate 325 (65 FE) MG tablet Take 1 tablet (325  mg total) by mouth 2 (two) times daily with a meal. 02/27/16   Lavina Hamman, MD  folic acid (FOLVITE) 1 MG tablet Take 1 tablet (1 mg total) by mouth daily. 04/22/16   [name], MD  furosemide (LASIX) 80 MG tablet Take 1 tablet (80 mg total) by mouth 2 (two) times daily. 03/04/16   [name], MD  glucose blood (ACCU-CHEK AVIVA) test strip Use 3 times daily as directed. 02/04/16   [name], MD  insulin glargine (LANTUS) 100 UNIT/ML injection Inject 0.15 mLs (15 Units total) into the skin at bedtime. 03/18/16   Robbie Lis, MD  isosorbide mononitrate (IMDUR) 30 MG 24 hr tablet Take 0.5 tablets (15 mg total) by mouth daily. Patient taking differently: Take 15 mg by mouth daily with supper.  10/24/15   Liliane Shi, PA-C  lactulose (CHRONULAC) 10 GM/15ML solution Take 30 mLs (20 g total) by mouth daily. 03/18/16   Robbie Lis, MD  Lancet Devices [facility]) lancets Use as instructed 3 times daily 01/24/16   [name], MD  lidocaine (XYLOCAINE) 2 % solution Use as directed 10 mLs in the mouth or throat every 6 (six) hours as needed for mouth pain. 02/12/16   [name], MD  metoprolol (LOPRESSOR) 100 MG tablet Take 1 tablet (100 mg total) by mouth 2 (two) times daily. 03/04/16   [name], MD  omeprazole (PRILOSEC) 20 MG capsule Take 1 capsule (20 mg total) by mouth daily. 03/04/16   [name], MD  potassium chloride SA (K-DUR,KLOR-CON) 20 MEQ tablet Take 1 tablet (20 mEq total) by mouth daily. 10/24/15   Liliane Shi, PA-C  Vitamin D, Ergocalciferol, (DRISDOL) 50000 UNITS CAPS capsule Take 50,000 Units by mouth every 7 (seven) days. Reported on 04/22/2016    Historical Provider, MD  warfarin (COUMADIN) 2.5 MG tablet Take 1 tablet (2.5 mg total) by mouth daily. Please take 5 mg on Monday and Wednesday and then 7.5 mg on every other day which is Tuesday, Thursday, Friday, Saturday and Sunday. 03/18/16   Robbie Lis, MD   BP 126/90 mmHg  Pulse 57  Temp(Src) 97.9 F (36.6 C) (Oral)   Resp 12  SpO2 100% Physical Exam  Constitutional: He is oriented to person, place, and time. He appears well-developed.  HENT:  Head: Atraumatic.  Pt has slight bleeding around his gingiva.  When asked to spit from his mouth alone, he still had hemoptysis  Neck: Neck supple.  Cardiovascular: Normal rate.   Pulmonary/Chest: Effort normal. No respiratory distress. He has no wheezes.  Abdominal: Soft. There is no tenderness.  Musculoskeletal: He exhibits tenderness. He exhibits no edema.  Generalized tenderness to the leg, no ecchymoses  Neurological: He is alert and oriented to person, place, and time.  Skin: Skin is warm.  Nursing note  and vitals reviewed.   ED Course  Procedures (including critical care time)  CRITICAL CARE Performed by: Varney Biles   Total critical care time: 35 minutes  Critical care time was exclusive of separately billable procedures and treating other patients.  Critical care was necessary to treat or prevent imminent or life-threatening deterioration.  Critical care was time spent personally by me on the following activities: development of treatment plan with patient and/or surrogate as well as nursing, discussions with consultants, evaluation of patient's response to treatment, examination of patient, obtaining history from patient or surrogate, ordering and performing treatments and interventions, ordering and review of laboratory studies, ordering and review of radiographic studies, pulse oximetry and re-evaluation of patient's condition.   Labs Review Labs Reviewed  PROTIME-INR - Abnormal; Notable for the following:    Prothrombin Time >90.0 (*)    INR >10.00 (*)    All other components within normal limits  CBC - Abnormal; Notable for the following:    Hemoglobin 11.0 (*)    HCT 34.2 (*)    MCV 74.3 (*)    MCH 23.9 (*)    RDW 18.6 (*)    All other components within normal limits  BASIC METABOLIC PANEL - Abnormal; Notable for the  following:    CO2 21 (*)    Glucose, Bld 125 (*)    BUN 63 (*)    Creatinine, Ser 2.93 (*)    GFR calc non Af Amer 22 (*)    GFR calc Af Amer 26 (*)    All other components within normal limits  HEPATIC FUNCTION PANEL - Abnormal; Notable for the following:    ALT 14 (*)    All other components within normal limits  CK  DIGOXIN LEVEL  OCCULT BLOOD X 1 CARD TO LAB, STOOL  HEPATIC FUNCTION PANEL    Imaging Review Dg Chest 2 View  06/01/2016  CLINICAL DATA:  Bilateral lower leg pain since yesterday. EXAM: CHEST  2 VIEW COMPARISON:  03/14/2016 FINDINGS: Previous median sternotomy and CABG procedure. Moderate cardiac enlargement noted. No pleural effusion or edema. No airspace consolidation identified. IMPRESSION: 1. Cardiac enlargement. 2. No acute findings. Electronically Signed   By: Kerby Moors M.D.   On: 06/01/2016 10:49   I have personally reviewed and evaluated these images and lab results as part of my medical decision-making.   EKG Interpretation None      MDM   Final diagnoses:  Supratherapeutic INR  Pain of lower extremity, unspecified laterality  Gingival bleeding  BRBPR (bright red blood per rectum)   Pt comes in with cc of bleeding. Hx of afib, on coumadin. No new meds or change in diet. Reports that last check was 10 days ago and was normal. Pt is having leg pain bilaterally. No signs of infection or trauma. No clear signs of internal bleeding/hematoma. Pt has no signs of compartment syndrome. + bright red stool - Hb is baseline normal. Exam confirms blood mixed in stool.  Pt's spit has blood - likely from the bleeding gums as per my exam. Lungs are clear. Clinically, less likely to be pulmonary hemorrhage. CXR is clear. No life threatening bleed at this time - so we will give Vit K.     Varney Biles, MD 06/01/16 1427

## 2016-06-01 NOTE — H&P (Signed)
Date: 06/01/2016               Patient Name:  Michael [name] MRN: [id number]  DOB: 04-Feb-1961 Age / Sex: 55 y.o., male   PCP: [name], MD              Medical Service: Internal Medicine Teaching Service              Attending Physician: Dr. [name], MD    First Contact: Beverely Pace, MS 4 Pager: [phone]  Second Contact: Dr. Jacques Earthly Pager: [phone]            After Hours (After 5p/  First Contact Pager: [phone]  weekends / holidays): Second Contact Pager: [phone]   Chief Complaint: leg and arm pain  History of Present Illness: [name] is a 55 yo gentleman with AFib on coumadin s/p Maze 2011, DMII, CKDIV, HTN, gout, HFrEF (EF40-45% 3/17), HLD, mitral valve replacement and tricuspid repair, OSA, GERD and cirrhosis who presents with bilateral leg and L forearm pain, found with INR >10.  Patient was in his baseline state of health until 3 days ago when he noted bilateral lateral leg pain and anterior L forearm pain. He notes this feels like soreness, is constant, varies from 8-10/10. He notes his ankles hurt as well but not his knees or elbow and no calf pain. He also noted spitting up blood, and is unsure is this came from a mouth lesion or elsewhere. He also noted blood in the toilet bowl over this same time course without preceding melena, though he does note smaller and more frequent bowel movements. He has continued to spit up blood and pass blood on the toilet since onset. He denies cough, hematemesis, nausea, vomiting, diarrhea, constipation, abdominal pain.   He has no recent medication changes including no new medications (including OTC) and cites he has taken his medication as prescribed. He notes taking 2 of the green warfarin pills per last warfarin clinic changes. He notes taking ibuprofen (2 pills, 2-3x) a week ago for a left sided headache that resolved. He also notes taking his "gout medication" (allopurinol or colchicine per chart review)  "the smaller pill" when the pain started a few days ago. He has been taking tylenol #3 for the last few days, 2 pills q6 hours. He took tramadol a month ago but has not taken any since. He has not had recent rodenticide exposure. He has not had recent diet changes (recent new foods or avoiding foods), denies cocaine, marijuana use, does not drink alcohol. He took some of his regular medication this AM but is unable to remember which ones.   He denies confusion, fevers, chills, history of bleeding, dizziness, pre-syncope, change in vision, chest pain, dyspnea, myalgia, dysuria, hematuria, weight change, anesthesia, weakness, paresthesia, and denies recent illness.   Meds: No current facility-administered medications for this encounter.   Current Outpatient Prescriptions  Medication Sig Dispense Refill  . acetaminophen (TYLENOL) 325 MG tablet Take 650 mg by mouth every 6 (six) hours as needed for headache (pain). Reported on 01/10/2016    . acetaminophen-codeine (TYLENOL #3) 300-30 MG tablet TAKE 1 TABLET BY MOUTH EVERY 8 HOURS AS NEEDED FOR MODERATE PAIN. 60 tablet 0  . allopurinol (ZYLOPRIM) 100 MG tablet Take 1 tablet (100 mg total) by mouth 2 (two) times daily. 60 tablet 3  . Blood Glucose Monitoring Suppl (ACCU-CHEK AVIVA) device Use as Instructed 3 times daily 1 each 0  . colchicine 0.6 MG tablet Take  2 tablets (1.2 mg) at the onset of a gout flare, may repeat 1 tablet (0.6 mg) in one hour if symptoms persist. 30 tablet 2  . digoxin (LANOXIN) 0.125 MG tablet Take 1 tablet (0.125 mg total) by mouth daily. 30 tablet 11  . diphenhydramine-acetaminophen (TYLENOL PM) 25-500 MG TABS tablet Take 1 tablet by mouth daily as needed. Reported on 04/22/2016    . ferrous sulfate 325 (65 FE) MG tablet Take 1 tablet (325 mg total) by mouth 2 (two) times daily with a meal. 60 tablet 0  . folic acid (FOLVITE) 1 MG tablet Take 1 tablet (1 mg total) by mouth daily. 30 tablet 3  . furosemide (LASIX) 80 MG tablet Take  1 tablet (80 mg total) by mouth 2 (two) times daily. 60 tablet 3  . glucose blood (ACCU-CHEK AVIVA) test strip Use 3 times daily as directed. 100 each 5  . insulin glargine (LANTUS) 100 UNIT/ML injection Inject 0.15 mLs (15 Units total) into the skin at bedtime. 10 mL 0  . isosorbide mononitrate (IMDUR) 30 MG 24 hr tablet Take 0.5 tablets (15 mg total) by mouth daily. (Patient taking differently: Take 15 mg by mouth daily with supper. ) 30 tablet 11  . lactulose (CHRONULAC) 10 GM/15ML solution Take 30 mLs (20 g total) by mouth daily. 240 mL 0  . Lancet Devices (ACCU-CHEK SOFTCLIX) lancets Use as instructed 3 times daily 1 each 5  . lidocaine (XYLOCAINE) 2 % solution Use as directed 10 mLs in the mouth or throat every 6 (six) hours as needed for mouth pain. 100 mL 0  . metoprolol (LOPRESSOR) 100 MG tablet Take 1 tablet (100 mg total) by mouth 2 (two) times daily. 60 tablet 3  . omeprazole (PRILOSEC) 20 MG capsule Take 1 capsule (20 mg total) by mouth daily. 30 capsule 3  . potassium chloride SA (K-DUR,KLOR-CON) 20 MEQ tablet Take 1 tablet (20 mEq total) by mouth daily. 30 tablet 11  . Vitamin D, Ergocalciferol, (DRISDOL) 50000 UNITS CAPS capsule Take 50,000 Units by mouth every 7 (seven) days. Reported on 04/22/2016    . warfarin (COUMADIN) 2.5 MG tablet Take 1 tablet (2.5 mg total) by mouth daily. Please take 5 mg on Monday and Wednesday and then 7.5 mg on every other day which is Tuesday, Thursday, Friday, Saturday and Sunday. 60 tablet 0    Allergies: Allergies as of 06/01/2016  . (No Known Allergies)   Past Medical History  Diagnosis Date  . Coronary artery disease   . Diabetes mellitus without complication ([place])   . Hypertension   . Chronic systolic CHF (congestive heart failure) (HCC)     a. prior EF normal; b. Echo 10/16: Mild LVH, EF 30-35%, mitral valve bioprosthesis present without evidence of stenosis or regurgitation, severe LAE, mild RVE with mild to moderately reduced RVSF,  moderate RAE  . 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 H/O tricuspid valve repair 2012  . Paroxysmal atrial fibrillation (HCC)     s/p Maze procedure at time of MVR in 2011  . Diabetes mellitus type 2 in obese ([place])   . Obstructive sleep apnea   . Chronic kidney disease (CKD), stage IV (severe) ([place])   . Anemia of chronic disease   . Pulmonary HTN ([place])   . History of echocardiogram     Echo at [facility] in [facility] 4/12: mod LVH, EF 60-65%, mod LAE, tissue MVR ok, mod TR, mod pulmo HTN with RVSP 48 mmHg  .  Asthma   . GERD (gastroesophageal reflux disease)    Past Surgical History  Procedure Laterality Date  . Cardiac surgery     Family History  Problem Relation Age of Onset  . Heart attack Neg Hx    Social History   Social History  . Marital Status: Single    Spouse Name: N/A  . Number of Children: N/A  . Years of Education: N/A   Occupational History  . Not on file.   Social History Main Topics  . Smoking status: Former Research scientist (life sciences)  . Smokeless tobacco: Not on file  . Alcohol Use: No  . Drug Use: No  . Sexual Activity: Not on file   Other Topics Concern  . Not on file   Social History Narrative  Social: lives alone, has help from daughter and son, over 48 pack year smoking history, quit in 2016.   Review of Systems: Pertinent items are noted in HPI.  Physical Exam: Blood pressure 126/90, pulse 57, temperature 97.9 F (36.6 C), temperature source Oral, resp. rate 12, SpO2 100 %. Gen: pleasant middle aged gentleman lying on bed in ER hallway HEENT: anicteric sclera, no conjunctival pallor, minimal bleeding along gums, blood on midline hard palate, crusted blood on lips, no OP lesions appreciated, no carotid bruits CV: RRR grade I/VI murmur heard best LLSB, non radiating Pulm: Normal effort and rate, CTAB Abdomen: soft, NT, ND, normoactive bowel sounds Extremities: mildly tender bilateral legs, negative Homan sign,  Mildly tender L anterior forearm  proportionate to exam, knees and elbow nontender and without edema, sensation intact distally times 4, pulses 2+ x 4 extremities, legs equal in circumference with no increased edema or tension, normal capillary refill bilaterally  Skin: warm, well perfused, petechiae bilateral legs, scattered patches of hyperpigmentation distal legs bilaterally  Neuro: Alert and oriented x 3  Lab results: @LABTEST @  Imaging results:  Dg Chest 2 View  06/01/2016  CLINICAL DATA:  Bilateral lower leg pain since yesterday. EXAM: CHEST  2 VIEW COMPARISON:  03/14/2016 FINDINGS: Previous median sternotomy and CABG procedure. Moderate cardiac enlargement noted. No pleural effusion or edema. No airspace consolidation identified. IMPRESSION: 1. Cardiac enlargement. 2. No acute findings. Electronically Signed   By: Kerby Moors M.D.   On: 06/01/2016 10:49    Other results: none   Assessment & Plan by Problem: Active Problems:   Supratherapeutic INR  [name] is a 55 yo gentleman with AFib on coumadin s/p Maze 2011, DMII, CKDIV, HTN, gout, HFrEF (EF40-45% 3/17), HLD, mitral valve replacement and tricuspid repair, OSA, GERD and cirrhosis who presents with bilateral leg and L forearm pain, found with INR >10.  Supratherapeutic INR with bleeding: Patient cites compliance with medication with no recent changes, no change in diet and nor recent illness. Recent APAP (>2g for multiple days) and ibuprofen could enhance warfarin effect and/or increase warfarin concentration, which omeprazole and allopurinol can also cause. Patient is also at increased bleeding risk with cirrhosis and heart failure, which are likely contributing to impaired metabolism. Patient with GI and OP bleeding now s/p 10mg  Vit K in ER. For signs more significant bleeding (AMS, tachycardia), will administer 4-factor prothrombin complex concentrate. With Vitamin K, INR should become therapeutic in 24-48 hours. -- hold warfarin  -- repeat Vit K q12  hours if INR elevated  -- administer tranexamic acid 5% once for OP bleeding  -- f/u repeat INR -- if significant bleeding, administer KCentra -- f/u FOBT   Limb pain: Patient with equal diameter  of legs with distal pulses, sensation intact and pain in proportion with exam. This is most likely due to deep hematoma given above, with no current signs of compartment syndrome. Many non-narcotic analgesics can exacerbate the problem above.  -- Morphine 1mg  q4 for pain  -- Lidocaine 4% cream for pain   Atrial fibrillation with RVR: In NSR. Digoxin level 1.4.  -- hold warfarin as above   -- continue digoxin 0.125 mg daily -- continue metoprolol 100mg  BID   DMII: Last A1c 6.3 in 4/17 -- lantus 15U nightly   HFrEF: Last echo 3/17 EF 40-45%. Enlarged cardiac silhouette on CXR.  -- continue Lasix 80mg  BID -- continue imdur 15mg  daily -- continue metoprolol as above   HTN: Normotensive on admission.  -- continue Imdur and metoprolol as above   GERD: On home omeprazole which can increase warfarin concentration. -- start Protonix   Gout: Unclear which medication he took recently, colchicine or allopurinol, but allopurinol can enhance warfarin effect.  -- hold allopurinol   Idiopathic cirrhosis with h/o hepatic encephalopathy: Stable. -- continue lactulose 20g   CKD stage IV: Baseline SCr 2-2.3. Admission SCr 2.93.   FEN/GI/prophy: DVT: SQ Lovenox Diet: NPO IVF: D5 1/2NS 164ml/hr for 12 hrs   This is a Careers information officer Note.  The care of the patient was discussed with Dr. Jacques Earthly and the assessment and plan was formulated with their assistance.  Please see their note for official documentation of the patient encounter.   Signed: Evert Kohl, Med Student 06/01/2016, 2:11 PM

## 2016-06-01 NOTE — ED Notes (Signed)
Patient transported to X-ray 

## 2016-06-01 NOTE — ED Notes (Signed)
Attempted report to 5N, no one will answer phone.

## 2016-06-02 DIAGNOSIS — N179 Acute kidney failure, unspecified: Secondary | ICD-10-CM | POA: Diagnosis not present

## 2016-06-02 DIAGNOSIS — N184 Chronic kidney disease, stage 4 (severe): Secondary | ICD-10-CM

## 2016-06-02 DIAGNOSIS — R791 Abnormal coagulation profile: Secondary | ICD-10-CM | POA: Diagnosis not present

## 2016-06-02 DIAGNOSIS — D509 Iron deficiency anemia, unspecified: Secondary | ICD-10-CM

## 2016-06-02 DIAGNOSIS — I4891 Unspecified atrial fibrillation: Secondary | ICD-10-CM

## 2016-06-02 DIAGNOSIS — E1122 Type 2 diabetes mellitus with diabetic chronic kidney disease: Secondary | ICD-10-CM | POA: Diagnosis not present

## 2016-06-02 DIAGNOSIS — I5022 Chronic systolic (congestive) heart failure: Secondary | ICD-10-CM

## 2016-06-02 LAB — CBC
HCT: 29.1 % — ABNORMAL LOW (ref 39.0–52.0)
HCT: 31.8 % — ABNORMAL LOW (ref 39.0–52.0)
Hemoglobin: 9.3 g/dL — ABNORMAL LOW (ref 13.0–17.0)
Hemoglobin: 10 g/dL — ABNORMAL LOW (ref 13.0–17.0)
MCH: 23.6 pg — AB (ref 26.0–34.0)
MCH: 23.4 pg — ABNORMAL LOW (ref 26.0–34.0)
MCHC: 32 g/dL (ref 30.0–36.0)
MCHC: 31.4 g/dL (ref 30.0–36.0)
MCV: 73.9 fL — AB (ref 78.0–100.0)
MCV: 74.5 fL — ABNORMAL LOW (ref 78.0–100.0)
PLATELETS: 224 10*3/uL (ref 150–400)
PLATELETS: 226 10*3/uL (ref 150–400)
RBC: 3.94 MIL/uL — ABNORMAL LOW (ref 4.22–5.81)
RBC: 4.27 MIL/uL (ref 4.22–5.81)
RDW: 18.3 % — ABNORMAL HIGH (ref 11.5–15.5)
RDW: 18.5 % — AB (ref 11.5–15.5)
WBC: 6.1 10*3/uL (ref 4.0–10.5)
WBC: 5.6 10*3/uL (ref 4.0–10.5)

## 2016-06-02 LAB — PROTIME-INR
INR: 1.69 — AB (ref 0.00–1.49)
PROTHROMBIN TIME: 19.9 s — AB (ref 11.6–15.2)

## 2016-06-02 LAB — BASIC METABOLIC PANEL
Anion gap: 5 (ref 5–15)
BUN: 56 mg/dL — AB (ref 6–20)
CALCIUM: 8.7 mg/dL — AB (ref 8.9–10.3)
CO2: 20 mmol/L — AB (ref 22–32)
CREATININE: 2.16 mg/dL — AB (ref 0.61–1.24)
Chloride: 110 mmol/L (ref 101–111)
GFR calc Af Amer: 38 mL/min — ABNORMAL LOW (ref >60)
GFR calc non Af Amer: 32 mL/min — ABNORMAL LOW (ref >60)
GLUCOSE: 138 mg/dL — AB (ref 65–99)
Potassium: 4.2 mmol/L (ref 3.5–5.1)
Sodium: 135 mmol/L (ref 135–145)

## 2016-06-02 LAB — GLUCOSE, CAPILLARY
GLUCOSE-CAPILLARY: 137 mg/dL — AB (ref 65–99)
GLUCOSE-CAPILLARY: 155 mg/dL — AB (ref 65–99)
Glucose-Capillary: 107 mg/dL — ABNORMAL HIGH (ref 65–99)

## 2016-06-02 MED ORDER — FERROUS SULFATE 325 (65 FE) MG PO TABS: 325.0000 mg | ORAL_TABLET | Freq: Every day | ORAL | Status: DC

## 2016-06-02 MED ORDER — ENSURE ENLIVE PO LIQD: 237.0000 mL | Freq: Two times a day (BID) | ORAL | Status: DC

## 2016-06-02 MED ORDER — DICLOFENAC SODIUM 1 % TD GEL
4.0000 g | Freq: Four times a day (QID) | TRANSDERMAL | Status: DC
  Administered 2016-06-02 (×3): 4 g via TOPICAL
  Filled 2016-06-02: qty 100

## 2016-06-02 MED ORDER — HYDROCODONE-ACETAMINOPHEN 5-325 MG PO TABS: 1.0000 | ORAL_TABLET | ORAL | Status: DC | PRN

## 2016-06-02 MED ORDER — DICLOFENAC SODIUM 1 % TD GEL: 4.0000 g | Freq: Four times a day (QID) | TRANSDERMAL | Status: DC

## 2016-06-02 MED ORDER — HYDROMORPHONE HCL 1 MG/ML IJ SOLN
0.5000 mg | Freq: Once | INTRAMUSCULAR | Status: AC
  Administered 2016-06-02: 0.5 mg via INTRAVENOUS
  Filled 2016-06-02: qty 1

## 2016-06-02 MED ORDER — WARFARIN SODIUM 5 MG PO TABS: 5.0000 mg | ORAL_TABLET | Freq: Once | ORAL | Status: DC

## 2016-06-02 MED ORDER — LOPERAMIDE HCL 2 MG PO CAPS
2.0000 mg | ORAL_CAPSULE | Freq: Once | ORAL | Status: AC
  Administered 2016-06-02: 2 mg via ORAL
  Filled 2016-06-02: qty 1

## 2016-06-02 MED ORDER — WARFARIN - PHARMACIST DOSING INPATIENT: Freq: Every day | Status: DC

## 2016-06-02 MED FILL — FERROUS SULFATE 325 MG TAB: 325 (65 FE) | 30 days supply | Qty: 30 | Fill #0

## 2016-06-02 NOTE — Progress Notes (Signed)
  Date: 06/02/2016  Patient name: Michael [name]  Medical record number: [id number]  Date of birth: [birth year]   I have seen and evaluated Michael [name] and discussed their care with the Residency Team. In brief, patient is a 55 y/o male with PMH of DM, CAD, HTN, chronic systolic CHF, MVR, OSA. pAfib on warfarin, CKD stage 4, HTN, asthma who p/w spitting up blood. Patient was last seen in coumadin clinic on 7/6 and noted to have elevated INR of 4.1 and had his coumadin dose decreased to 5 mg daily. 2 days ago he started spitting up blood and noted blood in the toilet water after BMs. He also complained of assoc left forearm and bilateral calf pain which was described as a soreness, no assoc swelling. No CP, no SOB, no abd pain, no n/v, no diarrhea, no palpitations, no diaphoresis. Feels better today with decreased pain in his calves and less blood in sputum.  PMHx, Fam Hx, and/or Soc Hx : as per resident admit note  Filed Vitals:   06/02/16 0505 06/02/16 0900  BP: 101/60 113/63  Pulse: 50 74  Temp: 98.4 F (36.9 C) 97.7 F (36.5 C)  Resp: 16 18   Gen: AAO*3, NAD CVS: RRR, normal heart sounds Lungs: CTA b/l Abd: soft, non tender, BS + Ext: no edema, mild tenderness to palpation R>L, mild ecchymosis, chronic venous stasis changes +   Assessment and Plan: I have seen and evaluated the patient as outlined above. I agree with the formulated Assessment and Plan as detailed in the residents' note, with the following changes:   1. Supratherapeutic INR with mucosal bleeding: - patient presented with INR of 10 and spitting up blood. Likely mucosal bleeding with elevated INR. This has now improved after normalization of INR. Uncertain etiology of elevated INR given dosage of coumadin had been reduced on last visit to coumadin clinic.  - Will hold coumadin for now and have him follow up in coumadin clinic for resumption if no further bleeding - It is likely that his MSK pain in his calves  is secondary to hematomas. Pain is improving. Will need outpatient follow up. Will c/w pain control - Will recheck Hg prior to d/c home. Baseline Hg is 9's- 10's  2. AKI on CKD: - possibly pre renal. Improved now. Close to baseline - Will monitor   [name], MD 7/17/201711:28 AM

## 2016-06-02 NOTE — Progress Notes (Signed)
Pt discharge education and instructions completed with pt at bedside; pt voices understanding and denies any questions. Pt IV removed; pt discharge home and pt inform RN he's going to self drive himself home. Pt handed his prescription for vicodin and to pick up electronically sent one from preferred pharmacy on file. Pt transported off unit via wheelchair with belongings to the side. Delia Heady RN

## 2016-06-02 NOTE — Progress Notes (Addendum)
Initial Nutrition Assessment  DOCUMENTATION CODES:   Obesity unspecified  INTERVENTION:  Continue nutritional supplementation post discharge at home especially if po intake is poor.   Plans for discharge today.   NUTRITION DIAGNOSIS:   Inadequate oral intake related to poor appetite as evidenced by meal completion < 50%.  GOAL:   Patient will meet greater than or equal to 90% of their needs  MONITOR:   PO intake, Supplement acceptance, Weight trends, Labs, I & O's  REASON FOR ASSESSMENT:   Malnutrition Screening Tool    ASSESSMENT:   55 year old man with history of DM2, CAD, HTN, HFrEF, HLD, mitral valve replacement with porcine valve, pAF on coumadin s/p MAZE, OSA, CKD4 (baseline 2), anemia of chronic disease (baseline 10), pHTN, asthma, GERD and cirrhosis presenting with spitting up blood, INR >10.   Meal completion has been 25%. Pt reports having a lack of appetite currently and PTA. Pt unable to describe how he was eating at home. Pt would just state that he would not eat if he was not hungry. Pt reports usual body weight unknown. Per Epic weight records, pt with a 10.2% weight loss in 3 months. Pt currently has Ensure ordered and has been consuming them. Plans for discharge today. Per MD, pt with less blood in sputum and decreased pain. Recommend continuation of nutritional supplements at home especially if po intake is poor.   Pt with no observed significant fat or muscle mass loss.   Labs and medications reviewed.   Diet Order:  DIET SOFT Room service appropriate?: Yes; Fluid consistency:: Thin Diet - low sodium heart healthy  Skin:  Reviewed, no issues  Last BM:  7/15  Height:   Ht Readings from Last 1 Encounters:  06/01/16 5\' 5"  (1.651 m)    Weight:   Wt Readings from Last 1 Encounters:  06/01/16 183 lb (83.008 kg)    Ideal Body Weight:  61.8 kg  BMI:  Body mass index is 30.45 kg/(m^2).  Estimated Nutritional Needs:   Kcal:   2100-2300  Protein:  115-130 grams  Fluid:  2.1 - 2.3 L/day  EDUCATION NEEDS:   Education needs addressed  Corrin Parker, MS, RD, LDN Pager # [phone] After hours/ weekend pager # [phone]

## 2016-06-02 NOTE — Progress Notes (Signed)
   Subjective: Feeling better this morning. Less blood in sputum. Pain is also better.  Objective: Vital signs in last 24 hours: Filed Vitals:   06/01/16 1800 06/01/16 2014 06/02/16 0505 06/02/16 0900  BP:  111/68 101/60 113/63  Pulse:  99 50 74  Temp:  97.6 F (36.4 C) 98.4 F (36.9 C) 97.7 F (36.5 C)  TempSrc:  Oral Oral Oral  Resp:  18 16 18   Height: 5\' 5"  (1.651 m)     Weight: 183 lb (83.008 kg)     SpO2:  100% 98% 100%   Physical Exam General Apperance: NAD HEENT: Normocephalic, atraumatic, anicteric sclera Neck: Supple, trachea midline Lungs: Clear to auscultation bilaterally. No wheezes, rhonchi or rales. Breathing comfortably Heart: Regular rate and rhythm, no murmur/rub/gallop Abdomen: Soft, nontender, nondistended, no rebound/guarding Extremities: Warm and well perfused, no edema Skin: Chronic venous stasis changes at BLE. Few petechiae Neurologic: Alert and interactive. No gross deficits.  Assessment/Plan: 55 year old man with history of DM2, CAD, HTN, chronic systolic CHF, mitral valve replacement with porcine valve, pAF s/p MAZE, OSA, CKD4, anemia of chronic disease, pHTN, asthma, GERD presenting with spitting up blood.  Supratherapeutic INR: Remains hemodynamically stable. Hgb 9.3 this morning from 11. He received IV fluids overnight. INR down to 2.03 -Continue to hold warfarin given limb pain and possible deep hematoma.  -Close follow up with coumadin clinic.  Limb pain: Could possibly be hematoma from above problem. No signs worrisome for compartment syndrome. Pain improved today. -Change Morphine 1mg  q4hr prn to Norco 5/325mg  q4hr prn -Voltaren gel QID  CKD4: Cr 2.93 and BUN 63 on admission. Previously BUN 42 and Cr 1.99 on 5/2. Baseline cr around 2. Down to 2.16 cr today.  Metabolic acidosis: CO2 21. No anion gap. Stable  Chronic microcytic anemia: Hgb 11. Baseline hgb 10. Ferritin 38 on 02/27/2016. -Ferrous sulfate 325mg  daily  Atrial fibrillation:  rate controlled. -Continue home digoxin 0.125mg  daily -Metoprolol 100mg  BID  Chronic systolic CHF: Echo 123456 with LV EF 40-45%. -Lasix 80mg  PO BID -Imdur 15mg  daily -Metoprolol 100mg  BID -No ACE due to CKD  DM2: A1c 6.3 on 03/15/2016 -SSI, Lantus 15u QHS  FEN: Soft diet CODE: FULL  Dispo: Likely home today     Milagros Loll, MD 06/02/2016, 9:48 AM Pager: [phone]

## 2016-06-02 NOTE — Progress Notes (Signed)
Subjective: Patient still notes pain in anterior L forearm and bilateral lateral legs, though notes it has improved since yesterday. He notes it is maybe 6/10 if yesterday was a 8-10/10. He is still bleeding in his mouth. He had a BM this AM that was dark brown with no hematochezia and no melena.   Objective: Vital signs in last 24 hours: Filed Vitals:   06/01/16 1800 06/01/16 2014 06/02/16 0505 06/02/16 0900  BP:  111/68 101/60 113/63  Pulse:  99 50 74  Temp:  97.6 F (36.4 C) 98.4 F (36.9 C) 97.7 F (36.5 C)  TempSrc:  Oral Oral Oral  Resp:  18 16 18   Height: 5\' 5"  (1.651 m)     Weight: 83.008 kg (183 lb)     SpO2:  100% 98% 100%   Weight change:  No intake or output data in the 24 hours ending 06/02/16 1011  Gen: pleasant middle aged gentleman lying on bed in ER hallway HEENT: anicteric sclera, no conjunctival pallor, minimal bleeding along gums, crusted blood on lips, no OP lesions appreciated CV: regular rate, irregularly irregular rhythm, no murmurs rubs or gallops  Pulm: Normal effort and rate, CTAB Abdomen: soft, NT, ND, normoactive bowel sounds Extremities: mildly tender bilateral lateral legs, Mildly tender L anterior forearm proportionate to exam, knees and elbow nontender and without edema, sensation intact distally times 4, pulses 2+ x 4 extremities, legs equal in circumference with no increased edema or tension, normal capillary refill bilaterally  Skin: warm, well perfused, petechiae bilateral legs, scattered patches of hyperpigmentation distal legs bilaterally with varicosities apparent   Neuro: Alert and oriented x 3  Lab Results: @LABTEST2 @ Micro Results: No results found for this or any previous visit (from the past 240 hour(s)). Studies/Results: Dg Chest 2 View  06/01/2016  CLINICAL DATA:  Bilateral lower leg pain since yesterday. EXAM: CHEST  2 VIEW COMPARISON:  03/14/2016 FINDINGS: Previous median sternotomy and CABG procedure. Moderate cardiac  enlargement noted. No pleural effusion or edema. No airspace consolidation identified. IMPRESSION: 1. Cardiac enlargement. 2. No acute findings. Electronically Signed   By: Kerby Moors M.D.   On: 06/01/2016 10:49   Medications: I have reviewed the patient's current medications.  Scheduled Meds: . digoxin  0.125 mg Oral Daily  . feeding supplement (ENSURE ENLIVE)  237 mL Oral BID BM  . ferrous sulfate  325 mg Oral Q breakfast  . furosemide  80 mg Oral BID  . insulin aspart  0-5 Units Subcutaneous QHS  . insulin aspart  0-9 Units Subcutaneous TID WC  . insulin glargine  15 Units Subcutaneous QHS  . isosorbide mononitrate  15 mg Oral Q supper  . lactulose  20 g Oral Daily  . metoprolol  100 mg Oral BID  . sodium chloride flush  3 mL Intravenous Q12H   Continuous Infusions:  PRN Meds:.sodium chloride, HYDROcodone-acetaminophen, lidocaine, sodium chloride flush Assessment/Plan: Active Problems:   Supratherapeutic INR  55 year old man with history of DM2, CAD, HTN, HFrEF, HLD, mitral valve replacement with porcine valve, pAF on coumadin s/p MAZE, OSA, CKD4 (baseline 2), anemia of chronic disease (baseline 10), pHTN, asthma, GERD and cirrhosis presenting with spitting up blood, INR >10.   Supratherapeutic INR: Resolved, patient now within therapeutic range. Patient still spitting up blood with oral mucosal bleeding on exam, remains hemodynamically stable with stable Hgb. Differential includes interaction between warfarin and other drugs and superimposed liver disease (history of cirrhosis but LFTs within normal limits today). Patient has a HAS-BLED score  of 4 and a CHADS-VASC score of 4. There is evidence suggesting improved survival in those that resumed warfarin after GI bleed, median restart time 4 days. Given his daily risk of stroke is low and he is bleeding OP and potentially as below, we will hold warfarin until outpatient setting.  -Hold warfarin -If patient develops serious bleeding,  administer KCENTRA   Limb pain: Improving, could be hematoma from above problem. No signs worrisome for compartment syndrome. -stop morphine prn -start percocet 5-325 prn  -Topical Lidocaine prn   CKD4: Cr 2.93 and BUN 63 on admission. Previously BUN 42 and Cr 1.99 on 5/2. Baseline cr around 2. BUN now 56 with SCr 2.16 after 617ml IVF.   Metabolic acidosis: Stable. CO2 20. No anion gap. Could be sign of early renal failure with impaired ammonia generation or secondary to GI/Renal loss.   Chronic microcytic anemia: Hgb 11. Baseline hgb 10. Ferritin 38 on 02/27/2016. Likely due to Fe deficiency though AOCI can also be contributing.  -Ferrous sulfate 325mg  daily  Atrial fibrillation: rate controlled. -Continue home digoxin 0.125mg  daily  HFrEF: Echo 02/08/2016 with LV EF 40-45%. No ACE due to CKD.  -Lasix 80mg  PO BID -Imdur 15mg  daily -Metoprolol 100mg  BID  DM2: A1c 6.3 on 03/15/2016 -SSI, Lantus 15u QHS  FEN: Soft diet CODE: FULL  Dispo: Observation, expected stay: 0-1 more days   This is a Careers information officer Note.  The care of the patient was discussed with Dr. Jacques Earthly and the assessment and plan formulated with their assistance. Please see their attached note for official documentation of the daily encounter.     Evert Kohl, Med Student 06/02/2016, 10:11 AM

## 2016-06-02 NOTE — Care Management Obs Status (Signed)
[facility] NOTIFICATION   Patient Details  Name: Michael [name] MRN: [id number] Date of Birth: 26-Jan-1961   Medicare Observation Status Notification Given:  Yes    Ninfa Meeker, RN 06/02/2016, 4:13 PM

## 2016-06-02 NOTE — Progress Notes (Addendum)
ANTICOAGULATION CONSULT NOTE - Initial Consult  Pharmacy Consult for Coumadin Indication: atrial fibrillation  No Known Allergies  Patient Measurements: Height: 5\' 5"  (165.1 cm) Weight: 183 lb (83.008 kg) IBW/kg (Calculated) : 61.5  Vital Signs: Temp: 98.4 F (36.9 C) (07/17 0505) Temp Source: Oral (07/17 0505) BP: 101/60 mmHg (07/17 0505) Pulse Rate: 50 (07/17 0505)  Labs:  Recent Labs  06/01/16 1001 06/01/16 2205 06/02/16 0328  HGB 11.0*  --  9.3*  HCT 34.2*  --  29.1*  PLT 247  --  224  LABPROT >90.0* 22.8*  --   INR >10.00* 2.03*  --   CREATININE 2.93*  --  2.16*  CKTOTAL 222  --   --     Estimated Creatinine Clearance: 37.9 mL/min (by C-G formula based on Cr of 2.16).   Medical History: Past Medical History  Diagnosis Date  . Coronary artery disease   . Diabetes mellitus without complication ([place])   . Hypertension   . Chronic systolic CHF (congestive heart failure) (HCC)     a. prior EF normal; b. Echo 10/16: Mild LVH, EF 30-35%, mitral valve bioprosthesis present without evidence of stenosis or regurgitation, severe LAE, mild RVE with mild to moderately reduced RVSF, moderate RAE  . 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 H/O tricuspid valve repair 2012  . Paroxysmal atrial fibrillation (HCC)     s/p Maze procedure at time of MVR in 2011  . Diabetes mellitus type 2 in obese ([place])   . Obstructive sleep apnea   . Chronic kidney disease (CKD), stage IV (severe) ([place])   . Anemia of chronic disease   . Pulmonary HTN ([place])   . History of echocardiogram     Echo at [facility] 4/12: mod LVH, EF 60-65%, mod LAE, tissue MVR ok, mod TR, mod pulmo HTN with RVSP 48 mmHg  . Asthma   . GERD (gastroesophageal reflux disease)     Medications:  Prescriptions prior to admission  Medication Sig Dispense Refill Last Dose  . acetaminophen (TYLENOL) 325 MG tablet Take 650 mg by mouth every 6 (six) hours as needed for headache (pain). Reported on  01/10/2016   06/01/2016 at Unknown time  . allopurinol (ZYLOPRIM) 100 MG tablet Take 1 tablet (100 mg total) by mouth 2 (two) times daily. 60 tablet 3 06/01/2016 at Unknown time  . Blood Glucose Monitoring Suppl (ACCU-CHEK AVIVA) device Use as Instructed 3 times daily 1 each 0 06/01/2016 at Unknown time  . colchicine 0.6 MG tablet Take 2 tablets (1.2 mg) at the onset of a gout flare, may repeat 1 tablet (0.6 mg) in one hour if symptoms persist. 30 tablet 2 06/01/2016 at Unknown time  . digoxin (LANOXIN) 0.125 MG tablet Take 1 tablet (0.125 mg total) by mouth daily. 30 tablet 11 06/01/2016 at Unknown time  . furosemide (LASIX) 80 MG tablet Take 1 tablet (80 mg total) by mouth 2 (two) times daily. 60 tablet 3 06/01/2016 at Unknown time  . glucose blood (ACCU-CHEK AVIVA) test strip Use 3 times daily as directed. 100 each 5 05/31/2016 at Unknown time  . insulin glargine (LANTUS) 100 UNIT/ML injection Inject 0.15 mLs (15 Units total) into the skin at bedtime. (Patient taking differently: Inject 20 Units into the skin at bedtime. ) 10 mL 0 05/31/2016 at Unknown time  . isosorbide mononitrate (IMDUR) 30 MG 24 hr tablet Take 0.5 tablets (15 mg total) by mouth daily. (Patient taking differently: Take 15 mg by mouth  daily with supper. ) 30 tablet 11 05/31/2016 at Unknown time  . Lancet Devices (ACCU-CHEK SOFTCLIX) lancets Use as instructed 3 times daily 1 each 5 Past Week at Unknown time  . metoprolol (LOPRESSOR) 100 MG tablet Take 1 tablet (100 mg total) by mouth 2 (two) times daily. 60 tablet 3 06/01/2016 at 0700  . omeprazole (PRILOSEC) 20 MG capsule Take 1 capsule (20 mg total) by mouth daily. 30 capsule 3 06/01/2016 at Unknown time  . potassium chloride SA (K-DUR,KLOR-CON) 20 MEQ tablet Take 1 tablet (20 mEq total) by mouth daily. 30 tablet 11 06/01/2016 at Unknown time  . warfarin (COUMADIN) 2.5 MG tablet Take 1 tablet (2.5 mg total) by mouth daily. Please take 5 mg on Monday and Wednesday and then 7.5 mg on every other  day which is Tuesday, Thursday, Friday, Saturday and Sunday. (Patient taking differently: Take 5 mg by mouth daily. ) 60 tablet 0 05/31/2016 at 1800  . acetaminophen-codeine (TYLENOL #3) 300-30 MG tablet TAKE 1 TABLET BY MOUTH EVERY 8 HOURS AS NEEDED FOR MODERATE PAIN. (Patient not taking: Reported on 06/01/2016) 60 tablet 0 Completed Course at Unknown time  . ferrous sulfate 325 (65 FE) MG tablet Take 1 tablet (325 mg total) by mouth 2 (two) times daily with a meal. (Patient not taking: Reported on 06/01/2016) 60 tablet 0 Not Taking at Unknown time  . folic acid (FOLVITE) 1 MG tablet Take 1 tablet (1 mg total) by mouth daily. (Patient not taking: Reported on 06/01/2016) 30 tablet 3 Not Taking at Unknown time  . lactulose (CHRONULAC) 10 GM/15ML solution Take 30 mLs (20 g total) by mouth daily. (Patient not taking: Reported on 06/01/2016) 240 mL 0 Not Taking at Unknown time  . [DISCONTINUED] lidocaine (XYLOCAINE) 2 % solution Use as directed 10 mLs in the mouth or throat every 6 (six) hours as needed for mouth pain. (Patient not taking: Reported on 06/01/2016) 100 mL 0 Not Taking at Unknown time   Scheduled:  . digoxin  0.125 mg Oral Daily  . feeding supplement (ENSURE ENLIVE)  237 mL Oral BID BM  . ferrous sulfate  325 mg Oral Q breakfast  . furosemide  80 mg Oral BID  .  HYDROmorphone (DILAUDID) injection  0.5 mg Intravenous Once  . insulin aspart  0-5 Units Subcutaneous QHS  . insulin aspart  0-9 Units Subcutaneous TID WC  . insulin glargine  15 Units Subcutaneous QHS  . isosorbide mononitrate  15 mg Oral Q supper  . lactulose  20 g Oral Daily  . metoprolol  100 mg Oral BID  . sodium chloride flush  3 mL Intravenous Q12H    Assessment: 55yo male admitted w/ limb pain and found to have INR >10 w/ oral mucosal bleeding, now INR 2.03 after 10mg  vit K, to resume Coumadin for Afib.  Goal of Therapy:  INR 2-3   Plan:  Will give Coumadin 5mg  po x1 and monitor INR, consider resuming home Coumadin  dose of 5mg  daily.  Wynona Neat, PharmD, BCPS  06/02/2016,7:12 AM   Addendum:  Warfarin consult cancelled due to concern for active or ongoing bleeding (noted blood in sputum). Daily INR's have been ordered. Will follow peripherally and resume warfarin when feasible.   Vincenza Hews, PharmD, BCPS 06/02/2016, 10:19 AM Pager: [phone]

## 2016-06-02 NOTE — Hospital Discharge Follow-Up (Signed)
Transitional Care Clinic Care Coordination Note:  Admit date:  06/01/2016 Discharge date: 06/02/2016 Discharge Disposition: home Patient contact:he reported that he has the same phone # [phone]. He stated that he would like to use the [name] interpreter when he is contacted by phone ; but in person he does not need an interpreter Emergency contact(s): no emergency #s provided at this time  This Case Manager reviewed patient's EMR and determined patient would benefit from post-discharge medical management and chronic care management services through the [facility]. Patient has a history of DM ,CAD, afib, HTN, MVR, CKD - stage 4 and was admitted with a supratherapeutic INR of 10. He has 7 hospital admissions in the past 12 months and has recently been followed in the [facility] This Case Manager met with patient to review  the services and medical management that can be provided at the [facility]. Patient verbalized understanding and agreed to receive post-discharge care at the [facility].   Patient scheduled for Transitional Care appointment on 06/10/16 @ 0930.  Clinic information and appointment time provided to patient. Appointment information also placed on AVS.  Assessment:       Home Environment: lives alone.       Support System: He stated that his daughter and son each live about 2 miles from him but noted that his daughter provides more support for him than his son.        Level of functioning: independent, driving       Home DME:has a cane to use prn       Home care services: (services arranged prior to discharge or new services after discharge) - none        Transportation: He drives but stated that his car is currently not working and he will need a ride to the [facility] for his appointment. He said that sometimes he is able to use his son's car.         Food/Nutrition: (ability to afford, access, use of any community resources) - He  stated that he has adequate nutrition and receives $16/month of food stamps         Medications: (ability to afford, access, compliance, Pharmacy used) he currently uses [facility]. He said that his doesn't have a problem affording his medications, the co-pays are usually about $3.00 ad if he can't pay for them , he is able to put the charges on an account and make payments when he can. A glucometer and testing supplies have been ordered for him in the past.         Identified Barriers: inconsistent transportation, lack of insurance drug coverage, inconsistent family support at home.  The interpreter can be used to address the language barrier contacting him by phone.         PCP (Name, office location, phone number): currently has been followed by Dr Jarold Song at the [facility]. He also is followed in the coumadin clinic for INR monitoring and coumadin management.              Arranged services:        Services communicated to Ricki Miller, RN CM

## 2016-06-02 NOTE — Discharge Summary (Signed)
Name: Michael [name] MRN: [id number] DOB: [birth year] 55 y.o. PCP: [name], MD  Date of Admission: 06/01/2016  9:25 AM Date of Discharge: 06/02/2016 Attending Physician: [name], MD  Discharge Diagnosis: 1. Supratherapeutic INR 2. AKI on CKD4 3. Chronic microcytic anemia 4. Atrial fibrillation 5. Chronic systolic CHF 6. DM2  Discharge Medications:   Medication List    STOP taking these medications        acetaminophen-codeine 300-30 MG tablet  Commonly known as:  TYLENOL #3     allopurinol 100 MG tablet  Commonly known as:  ZYLOPRIM     folic acid 1 MG tablet  Commonly known as:  FOLVITE     warfarin 2.5 MG tablet  Commonly known as:  COUMADIN      TAKE these medications        ACCU-CHEK AVIVA device  Use as Instructed 3 times daily     accu-chek softclix lancets  Use as instructed 3 times daily     acetaminophen 325 MG tablet  Commonly known as:  TYLENOL  Take 650 mg by mouth every 6 (six) hours as needed for headache (pain). Reported on 01/10/2016     colchicine 0.6 MG tablet  Take 2 tablets (1.2 mg) at the onset of a gout flare, may repeat 1 tablet (0.6 mg) in one hour if symptoms persist.     diclofenac sodium 1 % Gel  Commonly known as:  VOLTAREN  Apply 4 g topically 4 (four) times daily.     digoxin 0.125 MG tablet  Commonly known as:  LANOXIN  Take 1 tablet (0.125 mg total) by mouth daily.     ferrous sulfate 325 (65 FE) MG tablet  Take 1 tablet (325 mg total) by mouth daily with breakfast.     furosemide 80 MG tablet  Commonly known as:  LASIX  Take 1 tablet (80 mg total) by mouth 2 (two) times daily.     glucose blood test strip  Commonly known as:  ACCU-CHEK AVIVA  Use 3 times daily as directed.     HYDROcodone-acetaminophen 5-325 MG tablet  Commonly known as:  NORCO/VICODIN  Take 1 tablet by mouth every 4 (four) hours as needed for severe pain.     insulin glargine 100 UNIT/ML injection  Commonly known as:  LANTUS    Inject 0.15 mLs (15 Units total) into the skin at bedtime.     isosorbide mononitrate 30 MG 24 hr tablet  Commonly known as:  IMDUR  Take 0.5 tablets (15 mg total) by mouth daily.     lactulose 10 GM/15ML solution  Commonly known as:  CHRONULAC  Take 30 mLs (20 g total) by mouth daily.     metoprolol 100 MG tablet  Commonly known as:  LOPRESSOR  Take 1 tablet (100 mg total) by mouth 2 (two) times daily.     omeprazole 20 MG capsule  Commonly known as:  PRILOSEC  Take 1 capsule (20 mg total) by mouth daily.     potassium chloride SA 20 MEQ tablet  Commonly known as:  K-DUR,KLOR-CON  Take 1 tablet (20 mEq total) by mouth daily.        Disposition and follow-up:   Michael [name] was discharged from [facility] in Stable condition.  At the hospital follow up visit please address:  1.  Supratherapeutic INR: He was discharged with instructions to follow up with his coumadin clinic for restart of warfarin.   Limb pain: Could possibly be  deep hematoma from above problem. Given his low daily risk for thrombotic events (atrial fibrillation), warfarin was held on discharge with plan to have it restarted at follow up with coumadin clinic.  AKI on CKD4: He was given IV fluids and his Cr came down to 2.16.  Mild metabolic acidosis: Possibly from CKD4. Consider PO sodium bicarb.  2.  Labs / imaging needed at time of follow-up: PT/INR, CBC, BMP  3.  Pending labs/ test needing follow-up: None  Follow-up Appointments:     Follow-up Information    Follow up with [name], MD. Go on 06/13/2016.   Specialty:  Family Medicine   Why:  appointment at [place] for [facility] follow-up    Contact information:   [place] [address] [phone]       Follow up with [facility] Office. Go on 06/06/2016.   Specialty:  Cardiology   Why:  Coumadin Clinic appointment at 02:15PM (with [name])    Contact information:   [address], [place] Hospital Course by problem list:   1. Supratherapeutic INR: He reported spitting up blood for the past couple of days. Oral mucosal bleeding on exam. Hemodynamically stable in ED. Hgb 11. Baseline hgb 10. INR >10. CXR with cardiac enlargement but no acute findings. Differential included interaction between warfarin and other drugs, superimposed liver disease (history of cirrhosis but LFTs within normal limits today). Warfarin was held and 10mg  Vitamin K given by ED. Repeat INR 12 hours later was down to 2.03 and on discharge his INR was 1.69. He was discharged with instructions to follow up with his coumadin clinic for restart of warfarin.   2. Limb pain: Could possibly be deep hematoma from above problem. No signs worrisome for compartment syndrome. Pain was controlled with Voltaren gel and Norco 5/325mg . He was instructed to stop taking Tylenol #3 during this therapy. Given his low daily risk for thrombotic events (atrial fibrillation), warfarin was held on discharge with plan to have it restarted at follow up with coumadin clinic.  3. AKI on CKD4: Cr 2.93 and BUN 63 on admission. Previously BUN 42 and Cr 1.99 on 5/2. Baseline cr around 2. He was given IV fluids and his Cr came down to 2.16.  4. Metabolic acidosis: CO2 21. No anion gap. CO2 remained 20 the following morning. Possibly from [place]. Consider PO sodium bicarb.  Discharge Vitals:   BP 107/60 mmHg  Pulse 66  Temp(Src) 98.2 F (36.8 C) (Oral)  Resp 18  Ht 5\' 5"  (1.651 m)  Wt 183 lb (83.008 kg)  BMI 30.45 kg/m2  SpO2 98%  Pertinent Labs, Studies, and Procedures:  BMP Latest Ref Rng 06/02/2016 06/01/2016 03/18/2016  Glucose 65 - 99 mg/dL 138(H) 125(H) 119(H)  BUN 6 - 20 mg/dL 56(H) 63(H) 42(H)  Creatinine 0.61 - 1.24 mg/dL 2.16(H) 2.93(H) 1.99(H)  Sodium 135 - 145 mmol/L 135 135 138  Potassium 3.5 - 5.1 mmol/L 4.2 4.9 3.7  Chloride 101 - 111 mmol/L 110  108 98(L)  CO2 22 - 32 mmol/L 20(L) 21(L) 29  Calcium 8.9 - 10.3 mg/dL 8.7(L) 9.2 9.1    CBC Latest Ref Rng 06/02/2016 06/02/2016 06/01/2016  WBC 4.0 - 10.5 K/uL 5.6 6.1 6.0  Hemoglobin 13.0 - 17.0 g/dL 10.0(L) 9.3(L) 11.0(L)  Hematocrit 39.0 - 52.0 % 31.8(L) 29.1(L) 34.2(L)  Platelets 150 - 400 K/uL 226 224 247    Discharge  Instructions: Discharge Instructions    Call MD for:  difficulty breathing, headache or visual disturbances    Complete by:  As directed      Call MD for:  persistant dizziness or light-headedness    Complete by:  As directed      Call MD for:  persistant nausea and vomiting    Complete by:  As directed      Call MD for:  severe uncontrolled pain    Complete by:  As directed      Call MD for:  temperature >100.4    Complete by:  As directed      Call MD for:    Complete by:  As directed   Excessive bleeding     Diet - low sodium heart healthy    Complete by:  As directed      Discharge instructions    Complete by:  As directed   Do not take your warfarin/coumadin. Follow up with the coumadin clinic for instructions on how to restart your coumadin.  Use voltaren gel up to four times a day for your arm and leg pain. If your pain is severe you may try taking 1 tablet up to every 4 hours of the Norco (hydrocodone-acetaminophen).   Do not take your allopurinol. Check with your primary care doctor about this medication.     Increase activity slowly    Complete by:  As directed            Signed: Milagros Loll, MD 06/02/2016, 1:19 PM   Pager: [phone]

## 2016-06-03 ENCOUNTER — Telehealth: Payer: Self-pay

## 2016-06-03 NOTE — Telephone Encounter (Signed)
Transitional Care Clinic Post-discharge Follow-Up Phone Call:  Date of Discharge: 06/02/2016 Principal Discharge Diagnosis(es): spratherapeutic INR, AKI on CKD4, atrial fibrillation Post-discharge Communication: Call placed to the patient Call Completed: Yes                    With Whom: Patient Interpreter Needed: Yes               Language/Dialect: [name] Interpreter # [phone] with [employer]     Please check all that apply:  X  Patient is knowledgeable of his/her condition(s) and/or treatment. He stated that he is " feeling fine."  He noted that the pain in his arms and legs is "better" and stated that he is using the cream on the arms and legs 2- 3 times a day and taking his pain medication if needed.  He denied any bleeding.  X  Patient is caring for self at home.  ? Patient is receiving assist at home from family and/or caregiver. Family and/or caregiver is knowledgeable of patient's condition(s) and/or treatment. ? Patient is receiving home health services. If so, name of agency.     Medication Reconciliation:  X  Medication list reviewed with patient. - Medication review with the interpreter over the phone was difficult. The patient has no new medication. Reviewed the medications that he is to stop taking: coumadin, allopurinol, folic acid and tylenol #3. He again stated that he understood and didn't have any questions. Reminded him of the importance of not taking the coumadin and following up with the coumadin clinic on Friday, 06/06/16 .  Instructed him to bring all of his medications with him to his appointment at [facility] for Dr Jarold Song to review.  X  Patient obtained all discharge medications. If not, why? - Yes   Activities of Daily Living:  X  Independent - no DME used  ? Needs assist (describe; ? home DME used) ? [employer] (describe, ? home DME used)   Community resources in place for patient:  X  None  ? Home Health/Home DME ? Assisted Living ? Support  Group          Patient Education: Inquired who reviewed the discharge instructions and medication list with him and he stated that his daughter went over them with him and he has no questions and understands what to do.         Questions/Concerns discussed: confirmed his appointment for 06/10/16 2 0930 with Dr Jarold Song in the [facility]. He stated that he will need transportation to the clinic. CM to confirm the appointment the day prior and to schedule transportation at that time. Reminded him again of his appointment at the coumadin clinic on 06/06/16 @ 1415. He stated that he understood both appointment times.

## 2016-06-05 ENCOUNTER — Telehealth: Payer: Self-pay

## 2016-06-05 NOTE — Telephone Encounter (Signed)
This Case Manager placed call to patient with aide of [place] ([place] # [phone]) to remind him of upcoming Coumadin Clinic appointment on 06/06/16 at 1415. Patient aware of appointment and confirmed he had transportation to Coumadin Clinic appointment. Discussed importance of medication compliance and inquired if he would like to review his medication list. Patient indicated he did not need to review his medication list and confirmed he had all needed medications. Reminded patient that per discharge summary on 06/02/16 he is to STOP taking the following medications: acetaminophen-codeine 300-30 mg tablet, allopurinol 123XX123 tablet, folic acid 1 mg tablet, warfarin 2.5 mg tablet. Patient verbalized understanding. Patient reminded of his upcoming [facility] appointment on 06/10/16 at 0930 with Dr. Jarold Song, and patient verbalized understanding. He indicated he would need transportation to his appointment. Clinic Scheduler informed of patient's need for transportation to 06/10/16 appointment. Patient reminded to bring all medications with him to his upcoming appointment with Dr. Jarold Song. Patient verbalized understanding. No additional needs/concerns identified at this time.

## 2016-06-06 ENCOUNTER — Ambulatory Visit (INDEPENDENT_AMBULATORY_CARE_PROVIDER_SITE_OTHER): Payer: Medicare Other | Admitting: *Deleted

## 2016-06-06 DIAGNOSIS — I079 Rheumatic tricuspid valve disease, unspecified: Secondary | ICD-10-CM | POA: Diagnosis not present

## 2016-06-06 DIAGNOSIS — I4891 Unspecified atrial fibrillation: Secondary | ICD-10-CM

## 2016-06-06 DIAGNOSIS — I059 Rheumatic mitral valve disease, unspecified: Secondary | ICD-10-CM

## 2016-06-06 DIAGNOSIS — Z5181 Encounter for therapeutic drug level monitoring: Secondary | ICD-10-CM | POA: Diagnosis not present

## 2016-06-06 LAB — POCT INR: INR: 1.6

## 2016-06-06 MED FILL — FUROSEMIDE 80 MG TABLET: 80 | 30 days supply | Qty: 60 | Fill #1

## 2016-06-06 MED FILL — DIGITEK 125 MCG TABLET: 125 | 30 days supply | Qty: 30 | Fill #2

## 2016-06-09 ENCOUNTER — Telehealth: Payer: Self-pay

## 2016-06-09 NOTE — Telephone Encounter (Signed)
Call placed to the patient with the assistance of [place] interpreter # [phone] from [facility] Confirmed the patient's appointment for tomorrow, 06/10/16 @ 0930 with Dr Jarold Song in the [facility]. He said that he will be driving to the clinic and does not need a cab. Instructed him to bring all of his medications with him to his appointment and he stated that he would. He noted that he has been checking his blood sugars but it not keeping a log.  He stated that he is " feeling good " and is not having any bleeding.  No concerns/questions reported at this time.

## 2016-06-10 ENCOUNTER — Encounter: Payer: Self-pay | Admitting: Family Medicine

## 2016-06-10 ENCOUNTER — Ambulatory Visit: Payer: Medicare Other | Attending: Family Medicine | Admitting: Family Medicine

## 2016-06-10 VITALS — BP 109/73 | HR 72 | Temp 98.4°F | Ht 65.0 in | Wt 177.8 lb

## 2016-06-10 DIAGNOSIS — N184 Chronic kidney disease, stage 4 (severe): Secondary | ICD-10-CM | POA: Diagnosis not present

## 2016-06-10 DIAGNOSIS — I482 Chronic atrial fibrillation, unspecified: Secondary | ICD-10-CM

## 2016-06-10 DIAGNOSIS — I5022 Chronic systolic (congestive) heart failure: Secondary | ICD-10-CM

## 2016-06-10 DIAGNOSIS — M109 Gout, unspecified: Secondary | ICD-10-CM

## 2016-06-10 DIAGNOSIS — G4700 Insomnia, unspecified: Secondary | ICD-10-CM | POA: Insufficient documentation

## 2016-06-10 DIAGNOSIS — I10 Essential (primary) hypertension: Secondary | ICD-10-CM

## 2016-06-10 MED ORDER — PREDNISONE 20 MG PO TABS: 20.0000 mg | ORAL_TABLET | Freq: Every day | ORAL | 0 refills | Status: DC

## 2016-06-10 MED ORDER — TRAZODONE HCL 50 MG PO TABS: 50.0000 mg | ORAL_TABLET | Freq: Every evening | ORAL | 3 refills | Status: DC | PRN

## 2016-06-10 MED ORDER — TRAMADOL HCL 50 MG PO TABS: 50.0000 mg | ORAL_TABLET | Freq: Two times a day (BID) | ORAL | 1 refills | Status: DC | PRN

## 2016-06-10 MED FILL — predniSONE 20 MG TABS: 20 | 5 days supply | Qty: 5 | Fill #0

## 2016-06-10 MED FILL — traZODone HCL 50 MG TABS: 50 | 30 days supply | Qty: 30 | Fill #0

## 2016-06-10 MED FILL — traMADol HCL 50 MG TABS: 50 | 30 days supply | Qty: 60 | Fill #0

## 2016-06-10 MED FILL — METOPROLOL TARTRATE 100 MG: 100 | 30 days supply | Qty: 60 | Fill #3

## 2016-06-10 NOTE — Progress Notes (Signed)
Transitional care clinic  Date of telephone encounter: 05/24/16  Hospitalization dates: 06/01/16 through 06/02/16  Subjective:  Patient ID: Michael [name], male    DOB: 22-Jul-1961  Age: 55 y.o. MRN: [id number]  CC: Diabetes; Chronic Kidney Disease; Hand Pain (4th finger left hand); and Foot Swelling (swelling both ankles, pain in left calf)   HPI Aerik Gatley is a 18 with a history of Type 2DM (A1c 6.3), hypertension, hyperlipidemia, mitral valve replacement with porcine valve, tricuspid valve repair, atrial fibrillation (on anticoagulation with Coumadin),Chronic systolic heartfailure  (EF 40 -45% from 01/2016), CKD stage IV, Gout here for follow-up visit In the transitional care clinic after hospitalization for supratherapeutic INR.  He had presented with spitting up blood and mucosa bleeding on exam, an INR of greater than 10, hemoglobin was 11. He received vitamin K, Coumadin was held with a drop in INR to 1.69 at discharge. He did have right leg pain which was thought to be possibly from a deep hematoma and he was prescribed Norco 5/325. Allopurinol was discontinued during his hospitalization as creatinine was slightly elevated from a baseline of 2 to 2.93 which improved to 2.16 with IV fluids.  Since discharge he has been to the Coumadin clinic; his last INR at 1.6 and he has an upcoming appointment tomorrow. He complains of pain and swelling in his left ring finger, pain in both ankles (left greater than right) He also complains of insomnia and is requesting something for sleep.   Past Medical History:  Diagnosis Date  . Anemia of chronic disease   . Asthma   . Chronic kidney disease (CKD), stage IV (severe) ([place])   . Chronic systolic CHF (congestive heart failure) (HCC)    a. prior EF normal; b. Echo 10/16: Mild LVH, EF 30-35%, mitral valve bioprosthesis present without evidence of stenosis or regurgitation, severe LAE, mild RVE with mild to moderately reduced RVSF,  moderate RAE  . Coronary artery disease   . Diabetes mellitus type 2 in obese ([place])   . Diabetes mellitus without complication ([place])   . GERD (gastroesophageal reflux disease)   . H/O tricuspid valve repair 2012  . History of echocardiogram    Echo at [facility] in [facility] 4/12: mod LVH, EF 60-65%, mod LAE, tissue MVR ok, mod TR, mod pulmo HTN with RVSP 48 mmHg  . Hypertension   . Obstructive sleep apnea   . Paroxysmal atrial fibrillation (HCC)    s/p Maze procedure at time of MVR in 2011  . Pulmonary HTN ([place])   . 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 CARDIAC SURGERY       Outpatient Medications Prior to Visit  Medication Sig Dispense Refill  . Blood Glucose Monitoring Suppl (ACCU-CHEK AVIVA) device Use as Instructed 3 times daily 1 each 0  . diclofenac sodium (VOLTAREN) 1 % GEL Apply 4 g topically 4 (four) times daily. 100 g 0  . digoxin (LANOXIN) 0.125 MG tablet Take 1 tablet (0.125 mg total) by mouth daily. 30 tablet 11  . furosemide (LASIX) 80 MG tablet Take 1 tablet (80 mg total) by mouth 2 (two) times daily. 60 tablet 3  . glucose blood (ACCU-CHEK AVIVA) test strip Use 3 times daily as directed. 100 each 5  . HYDROcodone-acetaminophen (NORCO/VICODIN) 5-325 MG tablet Take 1 tablet by mouth every 4 (four) hours as needed for severe pain. 20 tablet 0  . Lancet Devices (ACCU-CHEK SOFTCLIX) lancets Use  as instructed 3 times daily 1 each 5  . metoprolol (LOPRESSOR) 100 MG tablet Take 1 tablet (100 mg total) by mouth 2 (two) times daily. 60 tablet 3  . omeprazole (PRILOSEC) 20 MG capsule Take 1 capsule (20 mg total) by mouth daily. 30 capsule 3  . potassium chloride SA (K-DUR,KLOR-CON) 20 MEQ tablet Take 1 tablet (20 mEq total) by mouth daily. 30 tablet 11  . acetaminophen (TYLENOL) 325 MG tablet Take 650 mg by mouth every 6 (six) hours as needed for headache (pain). Reported on 01/10/2016    . colchicine 0.6 MG  tablet Take 2 tablets (1.2 mg) at the onset of a gout flare, may repeat 1 tablet (0.6 mg) in one hour if symptoms persist. (Patient not taking: Reported on 06/10/2016) 30 tablet 2  . feeding supplement, ENSURE ENLIVE, (ENSURE ENLIVE) LIQD Take 237 mLs by mouth 2 (two) times daily between meals. (Patient not taking: Reported on 06/10/2016) 237 mL 12  . ferrous sulfate 325 (65 FE) MG tablet Take 1 tablet (325 mg total) by mouth daily with breakfast. (Patient not taking: Reported on 06/10/2016) 30 tablet 0  . insulin glargine (LANTUS) 100 UNIT/ML injection Inject 0.15 mLs (15 Units total) into the skin at bedtime. (Patient not taking: Reported on 06/10/2016) 10 mL 0  . isosorbide mononitrate (IMDUR) 30 MG 24 hr tablet Take 0.5 tablets (15 mg total) by mouth daily. (Patient not taking: Reported on 06/10/2016) 30 tablet 11   No facility-administered medications prior to visit.     ROS Review of Systems  Constitutional: Negative for activity change and appetite change.  HENT: Negative for sinus pressure and sore throat.   Eyes: Negative for visual disturbance.  Respiratory: Negative for cough, chest tightness and shortness of breath.   Cardiovascular: Negative for chest pain and leg swelling.  Gastrointestinal: Negative for abdominal distention, abdominal pain, constipation and diarrhea.  Endocrine: Negative.   Genitourinary: Negative for dysuria.  Musculoskeletal:       See hpi  Skin: Negative for rash.  Allergic/Immunologic: Negative.   Neurological: Negative for weakness, light-headedness and numbness.  Psychiatric/Behavioral: Negative for dysphoric mood and suicidal ideas.    Objective:  BP 109/73 (BP Location: Left Arm, Patient Position: Sitting, Cuff Size: Large)   Pulse 72   Temp 98.4 F (36.9 C) (Oral)   Ht 5\' 5"  (1.651 m)   Wt 177 lb 12.8 oz (80.6 kg)   SpO2 96%   BMI 29.59 kg/m   BP/Weight 06/10/2016 06/02/2016 [id number]  Systolic BP 0000000 [id number] -  Diastolic BP 73 60 -  Wt. (Lbs)  177.8 - 183  BMI 29.59 - 30.45      Physical Exam  Constitutional: He is oriented to person, place, and time. He appears well-developed and well-nourished.  Cardiovascular: Normal rate, normal heart sounds and intact distal pulses.   No murmur heard. Pulmonary/Chest: Effort normal and breath sounds normal. He has no wheezes. He has no rales. He exhibits no tenderness.  Abdominal: Soft. Bowel sounds are normal. He exhibits no distension and no mass. There is no tenderness.  Musculoskeletal: Normal range of motion. He exhibits edema (edema of left proximal interphalangeal joint with mild erythema of flexor aspect.) and tenderness (Tenderness on range of motion of the left ring finger and bilateral ankles).  Neurological: He is alert and oriented to person, place, and time.  Skin: Skin is warm and dry.  Large bruise on left lateral malleolus    CMP Latest Ref Rng & Units 06/02/2016 06/01/2016  03/18/2016  Glucose 65 - 99 mg/dL 138(H) 125(H) 119(H)  BUN 6 - 20 mg/dL 56(H) 63(H) 42(H)  Creatinine 0.61 - 1.24 mg/dL 2.16(H) 2.93(H) 1.99(H)  Sodium 135 - 145 mmol/L 135 135 138  Potassium 3.5 - 5.1 mmol/L 4.2 4.9 3.7  Chloride 101 - 111 mmol/L 110 108 98(L)  CO2 22 - 32 mmol/L 20(L) 21(L) 29  Calcium 8.9 - 10.3 mg/dL 8.7(L) 9.2 9.1  Total Protein 6.5 - 8.1 g/dL - 7.8 -  Total Bilirubin 0.3 - 1.2 mg/dL - 0.6 -  Alkaline Phos 38 - 126 U/L - 80 -  AST 15 - 41 U/L - 27 -  ALT 17 - 63 U/L - 14(L) -    Lab Results  Component Value Date   HGBA1C 6.3 (H) 03/15/2016    Lab Results  Component Value Date   INR 1.6 06/06/2016   INR 1.69 (H) 06/02/2016   INR 2.03 (H) 06/01/2016    Assessment & Plan:   1. CKD (chronic kidney disease), stage IV ([place]) Avoid nephrotoxins Keep appointment with nephrology  2. Essential hypertension Controlled  3. Chronic systolic CHF (congestive heart failure) (HCC) EF 40-45% from 01/2016 No evidence of acute failure at this time  4. Chronic atrial  fibrillation (HCC) Currently on anticoagulation with Coumadin. Last INR was 1.6; we'll need to keep an eye on his INR given he was just commenced on prednisone He has a follow-up at the Coumadin clinic tomorrow Still has residual bruise from supratherapeutic INR and advised to apply ice to left ankle.  5. Gout without tophus, unspecified cause, unspecified chronicity, unspecified site Acute flare We'll be cautious with prednisone to prevent acute exacerbation of heart failure Continue colchicine Patient knows to commence tramadol once he runs out of Vicodin. - predniSONE (DELTASONE) 20 MG tablet; Take 1 tablet (20 mg total) by mouth daily with breakfast.  Dispense: 5 tablet; Refill: 0 - traMADol (ULTRAM) 50 MG tablet; Take 1 tablet (50 mg total) by mouth every 12 (twelve) hours as needed.  Dispense: 60 tablet; Refill: 1  6. Insomnia - traZODone (DESYREL) 50 MG tablet; Take 1 tablet (50 mg total) by mouth at bedtime as needed for sleep.  Dispense: 30 tablet; Refill: 3   Meds ordered this encounter  Medications  . predniSONE (DELTASONE) 20 MG tablet    Sig: Take 1 tablet (20 mg total) by mouth daily with breakfast.    Dispense:  5 tablet    Refill:  0  . traMADol (ULTRAM) 50 MG tablet    Sig: Take 1 tablet (50 mg total) by mouth every 12 (twelve) hours as needed.    Dispense:  60 tablet    Refill:  1  . traZODone (DESYREL) 50 MG tablet    Sig: Take 1 tablet (50 mg total) by mouth at bedtime as needed for sleep.    Dispense:  30 tablet    Refill:  3    Follow-up: Return in about 1 week (around 06/17/2016) for TCC- follow up on Gout.   [name] MD

## 2016-06-10 NOTE — Patient Instructions (Signed)

## 2016-06-11 ENCOUNTER — Ambulatory Visit (INDEPENDENT_AMBULATORY_CARE_PROVIDER_SITE_OTHER): Payer: Medicare Other | Admitting: *Deleted

## 2016-06-11 ENCOUNTER — Telehealth: Payer: Self-pay

## 2016-06-11 DIAGNOSIS — Z5181 Encounter for therapeutic drug level monitoring: Secondary | ICD-10-CM | POA: Diagnosis not present

## 2016-06-11 DIAGNOSIS — I059 Rheumatic mitral valve disease, unspecified: Secondary | ICD-10-CM | POA: Diagnosis not present

## 2016-06-11 DIAGNOSIS — I4891 Unspecified atrial fibrillation: Secondary | ICD-10-CM | POA: Diagnosis not present

## 2016-06-11 DIAGNOSIS — I079 Rheumatic tricuspid valve disease, unspecified: Secondary | ICD-10-CM

## 2016-06-11 LAB — POCT INR: INR: 3.8

## 2016-06-11 NOTE — Telephone Encounter (Signed)
Met with the patient today when he was in the clinic.  He had questions about housing.  He noted that he currently receives disability and would like to find a new apartment. He currently pays $350/month.   While he was present and the phone was on speaker,call was placed to the [facility] # [phone]. Spoke to Ms Karis Juba who explained that the patient can come to their office and pick up a list of available apartments from the receptionist. If he needs to speak to someone, he must fill out an intake form and speak to one of the counselors. He can walk in Mon-Fri from 0830-1600. They do not provide interpreters and the intake form is in [place]. He stated that could have his daughter accompany him or he would try to find someone else to go with him who can interpret for him. Ms Karis Juba stated that the list for low income housing is very long. The patient said that he does not have to move right away.   Provided the patient with the address and phone # as well as the hours of the [employer].  He said that he would share that information with his daughter and he was very appreciative of the assistance.

## 2016-06-13 ENCOUNTER — Ambulatory Visit: Payer: Medicare Other | Admitting: Family Medicine

## 2016-06-16 ENCOUNTER — Ambulatory Visit (INDEPENDENT_AMBULATORY_CARE_PROVIDER_SITE_OTHER): Payer: Medicare Other | Admitting: Pharmacist

## 2016-06-16 ENCOUNTER — Telehealth: Payer: Self-pay

## 2016-06-16 DIAGNOSIS — I059 Rheumatic mitral valve disease, unspecified: Secondary | ICD-10-CM | POA: Diagnosis not present

## 2016-06-16 DIAGNOSIS — Z5181 Encounter for therapeutic drug level monitoring: Secondary | ICD-10-CM | POA: Diagnosis not present

## 2016-06-16 DIAGNOSIS — I079 Rheumatic tricuspid valve disease, unspecified: Secondary | ICD-10-CM

## 2016-06-16 DIAGNOSIS — I4891 Unspecified atrial fibrillation: Secondary | ICD-10-CM

## 2016-06-16 LAB — POCT INR: INR: 4.3

## 2016-06-16 NOTE — Telephone Encounter (Signed)
Call placed to the patient with the assistance of [place] interpreter # [phone] with [employer]. Confirmed the patient's appointment with the [facility] for tomorrow, 06/17/16 @ 0915. He stated that he does not have transportation tomorrow. [place] to provide cab transportation to the clinic.Jacklynn Lewis, [facility] scheduler notified of the patient's ned for a cab.   The patient stated that he is " doing okay" and has not had any bleeding. He stated that he is aware that he has an appointment at the coumadin clinic today at 1415 and he will have transportation. Reminded him to bring all of his medications with him to his appointment tomorrow.   Inquired if he was able to go to the [employer] and he said that he has not been able to get there yet.  No questions/problems reported at this time.

## 2016-06-17 ENCOUNTER — Encounter: Payer: Self-pay | Admitting: Family Medicine

## 2016-06-17 ENCOUNTER — Ambulatory Visit: Payer: Medicare Other | Attending: Family Medicine | Admitting: Family Medicine

## 2016-06-17 VITALS — BP 99/67 | HR 67 | Temp 97.8°F | Ht 65.0 in | Wt 176.8 lb

## 2016-06-17 DIAGNOSIS — Z794 Long term (current) use of insulin: Secondary | ICD-10-CM | POA: Diagnosis not present

## 2016-06-17 DIAGNOSIS — M25572 Pain in left ankle and joints of left foot: Secondary | ICD-10-CM | POA: Insufficient documentation

## 2016-06-17 DIAGNOSIS — I110 Hypertensive heart disease with heart failure: Secondary | ICD-10-CM | POA: Insufficient documentation

## 2016-06-17 DIAGNOSIS — I10 Essential (primary) hypertension: Secondary | ICD-10-CM

## 2016-06-17 DIAGNOSIS — I5022 Chronic systolic (congestive) heart failure: Secondary | ICD-10-CM

## 2016-06-17 DIAGNOSIS — K219 Gastro-esophageal reflux disease without esophagitis: Secondary | ICD-10-CM | POA: Diagnosis not present

## 2016-06-17 DIAGNOSIS — E118 Type 2 diabetes mellitus with unspecified complications: Secondary | ICD-10-CM | POA: Insufficient documentation

## 2016-06-17 DIAGNOSIS — Z79899 Other long term (current) drug therapy: Secondary | ICD-10-CM | POA: Insufficient documentation

## 2016-06-17 DIAGNOSIS — N184 Chronic kidney disease, stage 4 (severe): Secondary | ICD-10-CM | POA: Insufficient documentation

## 2016-06-17 DIAGNOSIS — M109 Gout, unspecified: Secondary | ICD-10-CM | POA: Insufficient documentation

## 2016-06-17 LAB — POCT GLYCOSYLATED HEMOGLOBIN (HGB A1C): Hemoglobin A1C: 6.2

## 2016-06-17 MED ORDER — FUROSEMIDE 80 MG PO TABS: 80.0000 mg | ORAL_TABLET | Freq: Two times a day (BID) | ORAL | 3 refills | Status: DC

## 2016-06-17 MED ORDER — METOPROLOL TARTRATE 100 MG PO TABS: 100.0000 mg | ORAL_TABLET | Freq: Two times a day (BID) | ORAL | 3 refills | Status: DC

## 2016-06-17 MED ORDER — OMEPRAZOLE 20 MG PO CPDR: 20.0000 mg | DELAYED_RELEASE_CAPSULE | Freq: Every day | ORAL | 3 refills | Status: DC

## 2016-06-17 MED ORDER — COLCHICINE 0.6 MG PO TABS: ORAL_TABLET | ORAL | 2 refills | Status: DC

## 2016-06-17 MED ORDER — INSULIN GLARGINE 100 UNIT/ML ~~LOC~~ SOLN: 15.0000 [IU] | Freq: Every day | SUBCUTANEOUS | 3 refills | Status: DC

## 2016-06-17 MED ORDER — INSULIN PEN NEEDLE 31G X 5 MM MISC: 1.0000 | Freq: Every day | 5 refills | Status: DC

## 2016-06-17 MED ORDER — INSULIN DETEMIR 100 UNIT/ML FLEXPEN: 10.0000 [IU] | PEN_INJECTOR | Freq: Every day | SUBCUTANEOUS | 11 refills | Status: DC

## 2016-06-17 MED FILL — LEVEMIR FLEXTOUCH 100 UNITS: 100 | 84 days supply | Qty: 15 | Fill #0

## 2016-06-17 MED FILL — OMEPRAZOLE DR 20 MG CAPSULE: 20 | 30 days supply | Qty: 30 | Fill #0

## 2016-06-17 MED FILL — COLCHICINE 0.6 MG TABLET: 0.6 | 12 days supply | Qty: 30 | Fill #0

## 2016-06-17 MED FILL — ULTICARE PEN NDL 4MM 32G: 32G X 4 MM | 50 days supply | Qty: 50 | Fill #0

## 2016-06-17 NOTE — Progress Notes (Signed)
Subjective:  Patient ID: Michael [name], male    DOB: 04-01-1961  Age: 55 y.o. MRN: [id number]  Transitional care clinic  Date of telephone encounter: 05/24/16  Hospitalization dates: 06/01/16 through 06/02/16  CC: Diabetes; Congestive Heart Failure; Chronic Kidney Disease; and Gout     HPI Michael [name] is a 55 with a history of Type 2DM (A1c 6.2), hypertension, hyperlipidemia, mitral valve replacement with porcine valve, tricuspid valve repair, atrial fibrillation (on anticoagulation with Coumadin),Chronic systolic heartfailure  (EF 40 -45% from 01/2016), CKD stage IV, Gout here for follow-up visit In the transitional care clinic after hospitalization for supratherapeutic INR of 10.  His last visit to the Coumadin clinic was yesterday where his INR was 4.3. He recently completed a five-day course of prednisone due to an acute gout flare in his ankles and hands which have resolved at this time he reports minimal pain in his left ankle; the bruise he previously had has also resolved.  He was also placed on trazodone for insomnia which helps his sleep. He is accompanied by an interpreter and has no concerns at this time.    Outpatient Medications Prior to Visit  Medication Sig Dispense Refill  . Blood Glucose Monitoring Suppl (ACCU-CHEK AVIVA) device Use as Instructed 3 times daily 1 each 0  . diclofenac sodium (VOLTAREN) 1 % GEL Apply 4 g topically 4 (four) times daily. 100 g 0  . digoxin (LANOXIN) 0.125 MG tablet Take 1 tablet (0.125 mg total) by mouth daily. 30 tablet 11  . docusate sodium (COLACE) 100 MG capsule Take 100 mg by mouth 2 (two) times daily.    . ferrous sulfate 325 (65 FE) MG tablet Take 1 tablet (325 mg total) by mouth daily with breakfast. 30 tablet 0  . glucose blood (ACCU-CHEK AVIVA) test strip Use 3 times daily as directed. 100 each 5  . HYDROcodone-acetaminophen (NORCO/VICODIN) 5-325 MG tablet Take 1 tablet by mouth every 4 (four) hours as needed for severe  pain. 20 tablet 0  . isosorbide mononitrate (IMDUR) 30 MG 24 hr tablet Take 0.5 tablets (15 mg total) by mouth daily. 30 tablet 11  . Lancet Devices (ACCU-CHEK SOFTCLIX) lancets Use as instructed 3 times daily 1 each 5  . potassium chloride SA (K-DUR,KLOR-CON) 20 MEQ tablet Take 1 tablet (20 mEq total) by mouth daily. 30 tablet 11  . traMADol (ULTRAM) 50 MG tablet Take 1 tablet (50 mg total) by mouth every 12 (twelve) hours as needed. 60 tablet 1  . traZODone (DESYREL) 50 MG tablet Take 1 tablet (50 mg total) by mouth at bedtime as needed for sleep. 30 tablet 3  . warfarin (COUMADIN) 2.5 MG tablet Take 2.5 mg by mouth daily.    . colchicine 0.6 MG tablet Take 2 tablets (1.2 mg) at the onset of a gout flare, may repeat 1 tablet (0.6 mg) in one hour if symptoms persist. 30 tablet 2  . furosemide (LASIX) 80 MG tablet Take 1 tablet (80 mg total) by mouth 2 (two) times daily. 60 tablet 3  . insulin glargine (LANTUS) 100 UNIT/ML injection Inject 0.15 mLs (15 Units total) into the skin at bedtime. 10 mL 0  . metoprolol (LOPRESSOR) 100 MG tablet Take 1 tablet (100 mg total) by mouth 2 (two) times daily. 60 tablet 3  . omeprazole (PRILOSEC) 20 MG capsule Take 1 capsule (20 mg total) by mouth daily. 30 capsule 3  . acetaminophen (TYLENOL) 325 MG tablet Take 650 mg by mouth every 6 (six) hours as  needed for headache (pain). Reported on 56    . feeding supplement, ENSURE ENLIVE, (ENSURE ENLIVE) LIQD Take 237 mLs by mouth 2 (two) times daily between meals. (Patient not taking: Reported on 56) 237 mL 12  . predniSONE (DELTASONE) 20 MG tablet Take 1 tablet (20 mg total) by mouth daily with breakfast. (Patient not taking: Reported on 56) 5 tablet 0   No facility-administered medications prior to visit.     ROS Review of Systems  Constitutional: Negative for activity change and appetite change.  HENT: Negative for sinus pressure and sore throat.   Eyes: Negative for visual disturbance.    Respiratory: Negative for cough, chest tightness and shortness of breath.   Cardiovascular: Negative for chest pain and leg swelling.  Gastrointestinal: Negative for abdominal distention, abdominal pain, constipation and diarrhea.  Endocrine: Negative.   Genitourinary: Negative for dysuria.  Musculoskeletal:       See hpi  Skin: Negative for rash.  Allergic/Immunologic: Negative.   Neurological: Negative for weakness, light-headedness and numbness.  Psychiatric/Behavioral: Negative for dysphoric mood and suicidal ideas.    Objective:  BP 99/67 (BP Location: Left Arm, Patient Position: Sitting, Cuff Size: Large)   Pulse 67   Temp 97.8 F (36.6 C) (Oral)   Ht 5\' 5"  (1.651 m)   Wt 176 lb 12.8 oz (80.2 kg)   SpO2 97%   BMI 29.42 kg/m   BP/Weight 06/17/2016 06/10/2016 [id number]  Systolic BP 99 0000000 [id number]  Diastolic BP 67 73 60  Wt. (Lbs) 176.8 177.8 -  BMI 29.42 29.59 -      Physical Exam  Constitutional: He is oriented to person, place, and time. He appears well-developed and well-nourished.  Cardiovascular: Normal rate, normal heart sounds and intact distal pulses.   No murmur heard. Pulmonary/Chest: Effort normal and breath sounds normal. He has no wheezes. He has no rales. He exhibits no tenderness.  Abdominal: Soft. Bowel sounds are normal. He exhibits no distension and no mass. There is no tenderness.  Musculoskeletal: Normal range of motion.  Neurological: He is alert and oriented to person, place, and time.    Lab Results  Component Value Date   HGBA1C 6.2 06/17/2016    Assessment & Plan:   1. Gastroesophageal reflux disease without esophagitis Controlled - omeprazole (PRILOSEC) 20 MG capsule; Take 1 capsule (20 mg total) by mouth daily.  Dispense: 30 capsule; Refill: 3  2. Essential hypertension Controlled - metoprolol (LOPRESSOR) 100 MG tablet; Take 1 tablet (100 mg total) by mouth 2 (two) times daily.  Dispense: 60 tablet; Refill: 3  3. Type 2 diabetes  mellitus with complication, with long-term current use of insulin (HCC) Controlled with A1c of 6.2 Lantus is not on formulary and so I have switched to Levemir with a reduction in dose to 10 units at bedtime  4. Chronic systolic CHF (congestive heart failure) (HCC) EF 40-45% Euvolemic Not on a statin; will need to be placed on one at his next visit given his cardiovascular risk. - furosemide (LASIX) 80 MG tablet; Take 1 tablet (80 mg total) by mouth 2 (two) times daily.  Dispense: 60 tablet; Refill: 3  5. Gout without tophus, unspecified cause, unspecified chronicity, unspecified site Previous acute flare has resolved He has completed course of prednisone. - colchicine 0.6 MG tablet; Take 2 tablets (1.2 mg) at the onset of a gout flare, may repeat 1 tablet (0.6 mg) in one hour if symptoms persist.  Dispense: 30 tablet; Refill: 2  6. Chronic kidney disease,  stage IV Avoid nephrotoxins Currently followed by [facility]  Meds ordered this encounter  Medications  . omeprazole (PRILOSEC) 20 MG capsule    Sig: Take 1 capsule (20 mg total) by mouth daily.    Dispense:  30 capsule    Refill:  3  . metoprolol (LOPRESSOR) 100 MG tablet    Sig: Take 1 tablet (100 mg total) by mouth 2 (two) times daily.    Dispense:  60 tablet    Refill:  3  . insulin glargine (LANTUS) 100 UNIT/ML injection    Sig: Inject 0.15 mLs (15 Units total) into the skin at bedtime.    Dispense:  10 mL    Refill:  3  . furosemide (LASIX) 80 MG tablet    Sig: Take 1 tablet (80 mg total) by mouth 2 (two) times daily.    Dispense:  60 tablet    Refill:  3  . colchicine 0.6 MG tablet    Sig: Take 2 tablets (1.2 mg) at the onset of a gout flare, may repeat 1 tablet (0.6 mg) in one hour if symptoms persist.    Dispense:  30 tablet    Refill:  2    Follow-up: 2 weeks for follow-up of gout   [name] MD

## 2016-06-17 NOTE — Progress Notes (Signed)
Left foot discomfort Medication refill

## 2016-06-25 ENCOUNTER — Ambulatory Visit (INDEPENDENT_AMBULATORY_CARE_PROVIDER_SITE_OTHER): Payer: Medicare Other | Admitting: *Deleted

## 2016-06-25 DIAGNOSIS — I079 Rheumatic tricuspid valve disease, unspecified: Secondary | ICD-10-CM

## 2016-06-25 DIAGNOSIS — I059 Rheumatic mitral valve disease, unspecified: Secondary | ICD-10-CM

## 2016-06-25 DIAGNOSIS — Z5181 Encounter for therapeutic drug level monitoring: Secondary | ICD-10-CM | POA: Diagnosis not present

## 2016-06-25 DIAGNOSIS — I4891 Unspecified atrial fibrillation: Secondary | ICD-10-CM

## 2016-06-25 LAB — POCT INR: INR: 5.7

## 2016-06-25 MED ORDER — WARFARIN SODIUM 2.5 MG PO TABS: 2.5000 mg | ORAL_TABLET | Freq: Every day | ORAL | 3 refills | Status: DC

## 2016-06-25 MED FILL — FOLIC ACID 1 MG TABLET: 1 | 30 days supply | Qty: 30 | Fill #0

## 2016-06-25 MED FILL — WARFARIN NA 2.5 MG TAB: 2.5 | 40 days supply | Qty: 40 | Fill #0

## 2016-06-25 MED FILL — POTASSIUM CL ER 20 MEQ TAB: 20 | 30 days supply | Qty: 30 | Fill #3

## 2016-06-27 ENCOUNTER — Telehealth: Payer: Self-pay

## 2016-06-27 NOTE — Telephone Encounter (Signed)
This Case Manager placed call to patient to inquire about status and to remind him of upcoming appointment on 07/01/16 at 1400 with Dr. Jarold Song. Call placed with assistance of [place] ([place] Interpreter #[id number]). This Case Manager inquired about patient's status, and patient indicated he was having nosebleeds again. Inquired about when he last had a nosebleed and patient indicated his nose "was bleeding a little while ago and stopped." Inquired how long his nose was bleeding, and patient could not state duration of nose bleed. He indicated his nose was not bleeding very long. He indicated he had nosebleeds about 2-3x/week. Patient denied bruising or bleeding from his gums. Chart reviewed and this Case Manager noted that patient's last INR was drawn on 06/25/16 and INR was 5.7. Per Coumadin Clinic's instructions, patient is to not take Coumadin for 3 days then dose change to 1 tablet (2.5 mg tablet) daily except on Wednesdays and Saturdays when patient will take 2 tablets (5 mg total). INR will be rechecked in one week.  Patient reminded that his next Coumadin Clinic appointment is on 07/02/16 at 1445. Patient verbalized understanding. This Case Manager repeated Coumadin dose instructions twice to patient, and patient confirmed he understood instructions. Patient aware he is to hold Coumadin x 3 days and patient confirmed he understood instructions that he will take 2.5 mg daily except on Wednesdays and Saturdays when he will take 2 tablets (5 mg total). This Case Manager discussed patient's nosebleeds with Dr. Jarold Song. Informed her of last INR and Coumadin Clinic dose instructions.  Dr. Jarold Song agreed with dose instructions and indicated patient should tilt head back and hold pressure to nose when he experiences a nose bleed. She indicated if nosebleeds worsen or do not stop after pressure held to site he should get to ED as soon as possible for evaluation. Patient updated on information, and patient verbalized  understanding.  Patient indicated he was taking all of his medications as directed, and he indicated he was checking his blood glucose twice daily. Informed patient to bring all his medications to his upcoming appointment and to keep log of blood glucose readings and to bring log to upcoming appointment for review. Patient reminded of his follow-up appointment on 07/01/16 at 1400 with Dr. Jarold Song. Patient verbalized understanding. Patient indicated he did not have a car and needed transportation to his appointment. Clinic Scheduler updated.   At last office visit, patient had informed Transitional Care Case Manager that he needed to find a new apartment. Patient was advised to go to the [employer]. Patient indicated he and his daughter went to [employer] to discuss housing needs. Patient appreciative of call and denied any further questions or concerns.

## 2016-07-01 ENCOUNTER — Ambulatory Visit: Payer: Medicare Other | Attending: Family Medicine | Admitting: Family Medicine

## 2016-07-01 ENCOUNTER — Telehealth: Payer: Self-pay

## 2016-07-01 ENCOUNTER — Encounter: Payer: Self-pay | Admitting: Family Medicine

## 2016-07-01 VITALS — BP 105/68 | HR 68 | Temp 97.8°F | Ht 65.0 in | Wt 173.6 lb

## 2016-07-01 DIAGNOSIS — M109 Gout, unspecified: Secondary | ICD-10-CM | POA: Insufficient documentation

## 2016-07-01 DIAGNOSIS — I482 Chronic atrial fibrillation, unspecified: Secondary | ICD-10-CM

## 2016-07-01 DIAGNOSIS — K219 Gastro-esophageal reflux disease without esophagitis: Secondary | ICD-10-CM | POA: Diagnosis not present

## 2016-07-01 DIAGNOSIS — Z794 Long term (current) use of insulin: Secondary | ICD-10-CM | POA: Diagnosis not present

## 2016-07-01 DIAGNOSIS — I5022 Chronic systolic (congestive) heart failure: Secondary | ICD-10-CM | POA: Diagnosis not present

## 2016-07-01 DIAGNOSIS — I10 Essential (primary) hypertension: Secondary | ICD-10-CM | POA: Insufficient documentation

## 2016-07-01 DIAGNOSIS — E1121 Type 2 diabetes mellitus with diabetic nephropathy: Secondary | ICD-10-CM | POA: Diagnosis not present

## 2016-07-01 DIAGNOSIS — N184 Chronic kidney disease, stage 4 (severe): Secondary | ICD-10-CM | POA: Insufficient documentation

## 2016-07-01 DIAGNOSIS — E119 Type 2 diabetes mellitus without complications: Secondary | ICD-10-CM | POA: Diagnosis not present

## 2016-07-01 DIAGNOSIS — E1122 Type 2 diabetes mellitus with diabetic chronic kidney disease: Secondary | ICD-10-CM | POA: Insufficient documentation

## 2016-07-01 DIAGNOSIS — I4891 Unspecified atrial fibrillation: Secondary | ICD-10-CM | POA: Insufficient documentation

## 2016-07-01 LAB — GLUCOSE, POCT (MANUAL RESULT ENTRY): POC Glucose: 101 mg/dl — AB (ref 70–99)

## 2016-07-01 MED ORDER — ATORVASTATIN CALCIUM 20 MG PO TABS
20.0000 mg | ORAL_TABLET | Freq: Every day | ORAL | 3 refills | Status: DC
Start: 2016-07-01 — End: 2017-03-03

## 2016-07-01 MED FILL — ATORVASTATIN 20 MG TABLET: 20 | 30 days supply | Qty: 30 | Fill #0

## 2016-07-01 NOTE — Patient Instructions (Signed)

## 2016-07-01 NOTE — Progress Notes (Signed)
nosebleeding the past two days

## 2016-07-01 NOTE — Progress Notes (Signed)
Transitional care clinic  Date of telephone encounter: 05/24/16  Hospitalization dates: 06/01/16 through 06/02/16  Subjective:    Patient ID: Michael [name], male    DOB: September 09, 1961, 55 y.o.   MRN: [id number]  HPI Michael [name] is a 69 with a history of Type 2DM (A1c 6.2), hypertension, hyperlipidemia, mitral valve replacement with porcine valve, tricuspid valve repair, atrial fibrillation (on anticoagulation with Coumadin),Chronic systolic heartfailure  (EF 40 -45% from 01/2016), CKD stage IV, Gout here for follow-up visit In the transitional care clinic.  His last visit to the Coumadin clinic was yesterday where his INR was 5.7 and he did complains of the case manager ofwhich he said he has not had in the last 2 days. He recently completed a five-day course of prednisone due to an acute gout flare in his ankles and hands which have resolved at this time he reports minimal pain in his left ankle; the bruise he previously had has also resolved.  He was also placed on trazodone for insomnia which helps his sleep. He is accompanied by an interpreter and has no concerns at this time.  Past Medical History:  Diagnosis Date  . Anemia of chronic disease   . Asthma   . Chronic kidney disease (CKD), stage IV (severe) ([place])   . Chronic systolic CHF (congestive heart failure) (HCC)    a. prior EF normal; b. Echo 10/16: Mild LVH, EF 30-35%, mitral valve bioprosthesis present without evidence of stenosis or regurgitation, severe LAE, mild RVE with mild to moderately reduced RVSF, moderate RAE  . Coronary artery disease   . Diabetes mellitus type 2 in obese ([place])   . Diabetes mellitus without complication ([place])   . GERD (gastroesophageal reflux disease)   . H/O tricuspid valve repair 2012  . History of echocardiogram    Echo at [facility] in [facility] 4/12: mod LVH, EF 60-65%, mod LAE, tissue MVR ok, mod TR, mod pulmo HTN with RVSP 48 mmHg  . Hypertension   . Obstructive sleep apnea   .  Paroxysmal atrial fibrillation (HCC)    s/p Maze procedure at time of MVR in 2011  . Pulmonary HTN ([place])   . 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 CARDIAC SURGERY      No Known Allergies  Current Outpatient Prescriptions on File Prior to Visit  Medication Sig Dispense Refill  . Blood Glucose Monitoring Suppl (ACCU-CHEK AVIVA) device Use as Instructed 3 times daily 1 each 0  . diclofenac sodium (VOLTAREN) 1 % GEL Apply 4 g topically 4 (four) times daily. 100 g 0  . digoxin (LANOXIN) 0.125 MG tablet Take 1 tablet (0.125 mg total) by mouth daily. 30 tablet 11  . docusate sodium (COLACE) 100 MG capsule Take 100 mg by mouth 2 (two) times daily.    . ferrous sulfate 325 (65 FE) MG tablet Take 1 tablet (325 mg total) by mouth daily with breakfast. 30 tablet 0  . furosemide (LASIX) 80 MG tablet Take 1 tablet (80 mg total) by mouth 2 (two) times daily. 60 tablet 3  . glucose blood (ACCU-CHEK AVIVA) test strip Use 3 times daily as directed. 100 each 5  . HYDROcodone-acetaminophen (NORCO/VICODIN) 5-325 MG tablet Take 1 tablet by mouth every 4 (four) hours as needed for severe pain. 20 tablet 0  . Insulin Detemir (LEVEMIR FLEXTOUCH) 100 UNIT/ML Pen Inject 10 Units into the skin daily at 10 pm. 15 mL 11  .  Insulin Pen Needle 31G X 5 MM MISC 1 each by Does not apply route at bedtime. 30 each 5  . Lancet Devices (ACCU-CHEK SOFTCLIX) lancets Use as instructed 3 times daily 1 each 5  . metoprolol (LOPRESSOR) 100 MG tablet Take 1 tablet (100 mg total) by mouth 2 (two) times daily. 60 tablet 3  . omeprazole (PRILOSEC) 20 MG capsule Take 1 capsule (20 mg total) by mouth daily. 30 capsule 3  . potassium chloride SA (K-DUR,KLOR-CON) 20 MEQ tablet Take 1 tablet (20 mEq total) by mouth daily. 30 tablet 11  . traMADol (ULTRAM) 50 MG tablet Take 1 tablet (50 mg total) by mouth every 12 (twelve) hours as needed. 60 tablet 1  . traZODone  (DESYREL) 50 MG tablet Take 1 tablet (50 mg total) by mouth at bedtime as needed for sleep. 30 tablet 3  . warfarin (COUMADIN) 2.5 MG tablet Take 1 tablet (2.5 mg total) by mouth daily. 40 tablet 3  . acetaminophen (TYLENOL) 325 MG tablet Take 650 mg by mouth every 6 (six) hours as needed for headache (pain). Reported on 01/10/2016    . colchicine 0.6 MG tablet Take 2 tablets (1.2 mg) at the onset of a gout flare, may repeat 1 tablet (0.6 mg) in one hour if symptoms persist. (Patient not taking: Reported on 07/01/2016) 30 tablet 2  . feeding supplement, ENSURE ENLIVE, (ENSURE ENLIVE) LIQD Take 237 mLs by mouth 2 (two) times daily between meals. (Patient not taking: Reported on 06/10/2016) 237 mL 12  . isosorbide mononitrate (IMDUR) 30 MG 24 hr tablet Take 0.5 tablets (15 mg total) by mouth daily. (Patient not taking: Reported on 07/01/2016) 30 tablet 11  . predniSONE (DELTASONE) 20 MG tablet Take 1 tablet (20 mg total) by mouth daily with breakfast. (Patient not taking: Reported on 06/17/2016) 5 tablet 0   No current facility-administered medications on file prior to visit.       Review of Systems Constitutional: Negative for activity change and appetite change.  HENT: Negative for sinus pressure and sore throat.   Eyes: Negative for visual disturbance.  Respiratory: Negative for cough, chest tightness and shortness of breath.   Cardiovascular: Negative for chest pain and leg swelling.  Gastrointestinal: Negative for abdominal distention, abdominal pain, constipation and diarrhea.  Endocrine: Negative.   Genitourinary: Negative for dysuria.  Musculoskeletal:       See hpi  Skin: Negative for rash.  Allergic/Immunologic: Negative.   Neurological: Negative for weakness, light-headedness and numbness.  Psychiatric/Behavioral: Negative for dysphoric mood and suicidal ideas.      Objective: Vitals:   07/01/16 1415  BP: 105/68  Pulse: 68  Temp: 97.8 F (36.6 C)  TempSrc: Oral  SpO2: 97%    Weight: 173 lb 9.6 oz (78.7 kg)  Height: 5\' 5"  (1.651 m)      Physical Exam  Constitutional: He is oriented to person, place, and time. He appears well-developed and well-nourished.  Cardiovascular: Normal rate, normal heart sounds and intact distal pulses.   No murmur heard. Pulmonary/Chest: Effort normal and breath sounds normal. He has no wheezes. He has no rales. He exhibits no tenderness.  Abdominal: Soft. Bowel sounds are normal. He exhibits no distension and no mass. There is no tenderness.  Musculoskeletal: Normal range of motion.  Neurological: He is alert and oriented to person, place, and time.       Assessment & Plan:  1. Gastroesophageal reflux disease without esophagitis Controlled - omeprazole (PRILOSEC) 20 MG capsule; Take 1 capsule (  20 mg total) by mouth daily.  Dispense: 30 capsule; Refill: 3  2. Essential hypertension Controlled - metoprolol (LOPRESSOR) 100 MG tablet; Take 1 tablet (100 mg total) by mouth 2 (two) times daily.  Dispense: 60 tablet; Refill: 3  3. Type 2 diabetes mellitus with complication, with long-term current use of insulin (HCC) Controlled with A1c of 6.2 Continue Levemir (Lantus is not on formulary and so I had switched to Levemir with a reduction in dose to 10 units at bedtime)  4. Chronic systolic CHF (congestive heart failure) (HCC) EF 40-45% Euvolemic Commenced on a Statin given his cardiovascular risk. - furosemide (LASIX) 80 MG tablet; Take 1 tablet (80 mg total) by mouth 2 (two) times daily.  Dispense: 60 tablet; Refill: 3  5. Gout without tophus, unspecified cause, unspecified chronicity, unspecified site Previous acute flare has resolved - colchicine 0.6 MG tablet; Take 2 tablets (1.2 mg) at the onset of a gout flare, may repeat 1 tablet (0.6 mg) in one hour if symptoms persist.  Dispense: 30 tablet; Refill: 2  6. Chronic kidney disease, stage IV Avoid nephrotoxins Currently followed by [place] [facility]  7. Atrial  fibrillation Last INR was 5.7 on 06/25/16 and Coumadin has been on hold Previously reported epistaxis have ceased. Advised to keep appointment with Coumadin clinic.

## 2016-07-01 NOTE — Telephone Encounter (Signed)
Met with the patient when he was in the clinic today regarding his housing situation. He stated that his daughter is working on it. He noted that she has left messages for the [employer] and is waiting for a call back as she has some questions. He is looking for a first floor apartment.  Encouraged him to have her keep trying to contact the coalition office and to possibly visit the office again if possible. He stated that he understood and noted that she is very busy with work but is trying to help him

## 2016-07-02 ENCOUNTER — Ambulatory Visit (INDEPENDENT_AMBULATORY_CARE_PROVIDER_SITE_OTHER): Payer: Medicare Other | Admitting: *Deleted

## 2016-07-02 DIAGNOSIS — I079 Rheumatic tricuspid valve disease, unspecified: Secondary | ICD-10-CM | POA: Diagnosis not present

## 2016-07-02 DIAGNOSIS — I4891 Unspecified atrial fibrillation: Secondary | ICD-10-CM

## 2016-07-02 DIAGNOSIS — Z5181 Encounter for therapeutic drug level monitoring: Secondary | ICD-10-CM | POA: Diagnosis not present

## 2016-07-02 DIAGNOSIS — I059 Rheumatic mitral valve disease, unspecified: Secondary | ICD-10-CM

## 2016-07-02 LAB — POCT INR: INR: 2.3

## 2016-07-11 ENCOUNTER — Other Ambulatory Visit: Payer: Self-pay | Admitting: Pulmonary Disease

## 2016-07-17 ENCOUNTER — Telehealth: Payer: Self-pay

## 2016-07-17 NOTE — Telephone Encounter (Signed)
This Case Manager placed call to follow-up with patient. Reminded patient of his Coumadin Clinic appointment today at 1415. Patient aware of appointment and indicated he was looking for a ride to his appointment. Discussed importance of not missing appointment. Patient verbalized understanding. Inquired if patient picked up Lipitor (20 mg daily) as it was prescribed by Dr. Jarold Song on 07/01/16. Patient indicated he picked up medication and denied any questions about his medications. Patient also denied any health concerns or questions at this time as well.  Reminded patient that Dr. Jarold Song would like to him to return to clinic in 3 months for follow-up of diabetes and gout or earlier if any problems arise. Patient verbalized understanding and appreciative of call. No additional needs/concerns identified.

## 2016-07-25 ENCOUNTER — Ambulatory Visit (INDEPENDENT_AMBULATORY_CARE_PROVIDER_SITE_OTHER): Payer: Medicare Other | Admitting: *Deleted

## 2016-07-25 DIAGNOSIS — I4891 Unspecified atrial fibrillation: Secondary | ICD-10-CM

## 2016-07-25 DIAGNOSIS — I079 Rheumatic tricuspid valve disease, unspecified: Secondary | ICD-10-CM | POA: Diagnosis not present

## 2016-07-25 DIAGNOSIS — I059 Rheumatic mitral valve disease, unspecified: Secondary | ICD-10-CM | POA: Diagnosis not present

## 2016-07-25 DIAGNOSIS — Z5181 Encounter for therapeutic drug level monitoring: Secondary | ICD-10-CM | POA: Diagnosis not present

## 2016-07-25 LAB — POCT INR: INR: 3.9

## 2016-07-29 MED FILL — POTASSIUM CL ER 20 MEQ TAB: 20 | 30 days supply | Qty: 30 | Fill #4

## 2016-07-29 MED FILL — FOLIC ACID 1 MG TABLET: 1 | 30 days supply | Qty: 30 | Fill #1

## 2016-07-29 MED FILL — ATORVASTATIN 20 MG TABLET: 20 | 30 days supply | Qty: 30 | Fill #1

## 2016-08-01 MED FILL — OMEPRAZOLE DR 20 MG CAPSULE: 20 | 30 days supply | Qty: 30 | Fill #1

## 2016-08-05 MED FILL — METOPROLOL TARTRATE 100 MG: 100 | 30 days supply | Qty: 60 | Fill #0 | Status: TO

## 2016-08-08 ENCOUNTER — Ambulatory Visit (INDEPENDENT_AMBULATORY_CARE_PROVIDER_SITE_OTHER): Payer: Medicare Other | Admitting: Pharmacist Clinician (PhC)/ Clinical Pharmacy Specialist

## 2016-08-08 ENCOUNTER — Encounter: Payer: Self-pay | Admitting: Family Medicine

## 2016-08-08 ENCOUNTER — Ambulatory Visit: Payer: Medicare Other | Attending: Family Medicine | Admitting: Family Medicine

## 2016-08-08 VITALS — BP 100/68 | HR 126 | Temp 97.2°F | Resp 16 | Wt 165.6 lb

## 2016-08-08 DIAGNOSIS — I079 Rheumatic tricuspid valve disease, unspecified: Secondary | ICD-10-CM | POA: Diagnosis not present

## 2016-08-08 DIAGNOSIS — M10071 Idiopathic gout, right ankle and foot: Secondary | ICD-10-CM

## 2016-08-08 DIAGNOSIS — I4891 Unspecified atrial fibrillation: Secondary | ICD-10-CM | POA: Diagnosis not present

## 2016-08-08 DIAGNOSIS — I059 Rheumatic mitral valve disease, unspecified: Secondary | ICD-10-CM | POA: Diagnosis not present

## 2016-08-08 DIAGNOSIS — M109 Gout, unspecified: Secondary | ICD-10-CM | POA: Diagnosis not present

## 2016-08-08 DIAGNOSIS — Z5181 Encounter for therapeutic drug level monitoring: Secondary | ICD-10-CM | POA: Diagnosis not present

## 2016-08-08 LAB — POCT INR: INR: 2.1

## 2016-08-08 MED ORDER — TRAMADOL HCL 50 MG PO TABS: 50.0000 mg | ORAL_TABLET | Freq: Two times a day (BID) | ORAL | 1 refills | Status: DC | PRN

## 2016-08-08 MED ORDER — WARFARIN SODIUM 2.5 MG PO TABS: ORAL_TABLET | ORAL | 3 refills | Status: DC

## 2016-08-08 MED ORDER — COLCHICINE 0.6 MG PO TABS: ORAL_TABLET | ORAL | 2 refills | Status: DC

## 2016-08-08 MED ORDER — PREDNISONE 20 MG PO TABS: 20.0000 mg | ORAL_TABLET | Freq: Every day | ORAL | 0 refills | Status: DC

## 2016-08-08 MED FILL — predniSONE 20 MG TABS: 20 | 5 days supply | Qty: 5 | Fill #0

## 2016-08-08 MED FILL — WARFARIN NA 2.5 MG TAB: 2.5 | 20 days supply | Qty: 40 | Fill #0

## 2016-08-08 MED FILL — traMADol HCL 50 MG TABS: 50 | 30 days supply | Qty: 60 | Fill #0

## 2016-08-08 NOTE — Progress Notes (Signed)
Subjective:  Patient ID: Michael [name], male    DOB: Jun 09, 1961  Age: 55 y.o. MRN: [id number]  CC: Foot Pain   HPI Michael [name] is a 55 with a history of Type 2DM (A1c 6.2), hypertension, hyperlipidemia, mitral valve replacement with porcine valve, tricuspid valve repair, atrial fibrillation (on anticoagulation with Coumadin),Chronic systolic heartfailure  (EF 40 -45% from 01/2016), CKD stage IV, Gout who comes into the clinic for an acute visit complaining of right foot pain from last few days. He has associated swelling and erythema of his right toe and denies recent ingestion of beef, seafood or foods that trigger gout.  He has run out of his tramadol and endorses compliance with allopurinol but it is uncertain if he took his colchicine.   Past Medical History:  Diagnosis Date  . Anemia of chronic disease   . Asthma   . Chronic kidney disease (CKD), stage IV (severe) ([place])   . Chronic systolic CHF (congestive heart failure) (HCC)    a. prior EF normal; b. Echo 10/16: Mild LVH, EF 30-35%, mitral valve bioprosthesis present without evidence of stenosis or regurgitation, severe LAE, mild RVE with mild to moderately reduced RVSF, moderate RAE  . Coronary artery disease   . Diabetes mellitus type 2 in obese ([place])   . Diabetes mellitus without complication ([place])   . GERD (gastroesophageal reflux disease)   . H/O tricuspid valve repair 2012  . History of echocardiogram    Echo at [facility] in [facility] 4/12: mod LVH, EF 60-65%, mod LAE, tissue MVR ok, mod TR, mod pulmo HTN with RVSP 48 mmHg  . Hypertension   . Obstructive sleep apnea   . Paroxysmal atrial fibrillation (HCC)    s/p Maze procedure at time of MVR in 2011  . Pulmonary HTN ([place])   . 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 CARDIAC SURGERY      No Known Allergies   Outpatient Medications Prior to Visit  Medication Sig Dispense Refill  .  acetaminophen (TYLENOL) 325 MG tablet Take 650 mg by mouth every 6 (six) hours as needed for headache (pain). Reported on 01/10/2016    . atorvastatin (LIPITOR) 20 MG tablet Take 1 tablet (20 mg total) by mouth daily. 30 tablet 3  . Blood Glucose Monitoring Suppl (ACCU-CHEK AVIVA) device Use as Instructed 3 times daily 1 each 0  . diclofenac sodium (VOLTAREN) 1 % GEL Apply 4 g topically 4 (four) times daily. 100 g 0  . digoxin (LANOXIN) 0.125 MG tablet Take 1 tablet (0.125 mg total) by mouth daily. 30 tablet 11  . docusate sodium (COLACE) 100 MG capsule Take 100 mg by mouth 2 (two) times daily.    . feeding supplement, ENSURE ENLIVE, (ENSURE ENLIVE) LIQD Take 237 mLs by mouth 2 (two) times daily between meals. 237 mL 12  . ferrous sulfate 325 (65 FE) MG tablet Take 1 tablet (325 mg total) by mouth daily with breakfast. 30 tablet 0  . furosemide (LASIX) 80 MG tablet Take 1 tablet (80 mg total) by mouth 2 (two) times daily. 60 tablet 3  . glucose blood (ACCU-CHEK AVIVA) test strip Use 3 times daily as directed. 100 each 5  . HYDROcodone-acetaminophen (NORCO/VICODIN) 5-325 MG tablet Take 1 tablet by mouth every 4 (four) hours as needed for severe pain. 20 tablet 0  . Insulin Detemir (LEVEMIR FLEXTOUCH) 100 UNIT/ML Pen Inject 10 Units into the skin daily at  10 pm. 15 mL 11  . Insulin Pen Needle 31G X 5 MM MISC 1 each by Does not apply route at bedtime. 30 each 5  . isosorbide mononitrate (IMDUR) 30 MG 24 hr tablet Take 0.5 tablets (15 mg total) by mouth daily. 30 tablet 11  . Lancet Devices (ACCU-CHEK SOFTCLIX) lancets Use as instructed 3 times daily 1 each 5  . metoprolol (LOPRESSOR) 100 MG tablet Take 1 tablet (100 mg total) by mouth 2 (two) times daily. 60 tablet 3  . omeprazole (PRILOSEC) 20 MG capsule Take 1 capsule (20 mg total) by mouth daily. 30 capsule 3  . potassium chloride SA (K-DUR,KLOR-CON) 20 MEQ tablet Take 1 tablet (20 mEq total) by mouth daily. 30 tablet 11  . traZODone (DESYREL) 50 MG  tablet Take 1 tablet (50 mg total) by mouth at bedtime as needed for sleep. 30 tablet 3  . warfarin (COUMADIN) 2.5 MG tablet Take 1-2 tablets by mouth daily as directed by coumadin clinic 40 tablet 3  . colchicine 0.6 MG tablet Take 2 tablets (1.2 mg) at the onset of a gout flare, may repeat 1 tablet (0.6 mg) in one hour if symptoms persist. 30 tablet 2  . predniSONE (DELTASONE) 20 MG tablet Take 1 tablet (20 mg total) by mouth daily with breakfast. 5 tablet 0  . traMADol (ULTRAM) 50 MG tablet Take 1 tablet (50 mg total) by mouth every 12 (twelve) hours as needed. 60 tablet 1   No facility-administered medications prior to visit.     ROS Review of Systems  Constitutional: Negative for activity change and appetite change.  HENT: Negative for sinus pressure and sore throat.   Respiratory: Negative for chest tightness, shortness of breath and wheezing.   Cardiovascular: Negative for chest pain and palpitations.  Gastrointestinal: Negative for abdominal distention, abdominal pain and constipation.  Genitourinary: Negative.   Musculoskeletal:       See hpi  Psychiatric/Behavioral: Negative for behavioral problems and dysphoric mood.    Objective:  BP 100/68 (BP Location: Right Arm, Patient Position: Sitting, Cuff Size: Small)   Pulse (!) 126   Temp 97.2 F (36.2 C) (Oral)   Resp 16   Wt 165 lb 9.6 oz (75.1 kg)   SpO2 100%   BMI 27.56 kg/m   BP/Weight 08/08/2016 08/10/2682 02/15/9621  Systolic BP 297 989 99  Diastolic BP 68 68 67  Wt. (Lbs) 165.6 173.6 176.8  BMI 27.56 28.89 29.42      Physical Exam  Constitutional: He is oriented to person, place, and time. He appears well-developed and well-nourished.  Cardiovascular: Normal heart sounds and intact distal pulses.  Tachycardia present.   No murmur heard. Pulmonary/Chest: Effort normal and breath sounds normal. He has no wheezes. He has no rales. He exhibits no tenderness.  Abdominal: Soft. Bowel sounds are normal. He exhibits no  distension and no mass. There is no tenderness.  Musculoskeletal: He exhibits edema (edema, erythema and tenderness of the right big toe and dorsum of right foot).  Neurological: He is alert and oriented to person, place, and time.     Assessment & Plan:   1. Gout without tophus, unspecified cause, unspecified chronicity, unspecified site - predniSONE (DELTASONE) 20 MG tablet; Take 1 tablet (20 mg total) by mouth daily with breakfast.  Dispense: 5 tablet; Refill: 0 - traMADol (ULTRAM) 50 MG tablet; Take 1 tablet (50 mg total) by mouth every 12 (twelve) hours as needed.  Dispense: 60 tablet; Refill: 1 - colchicine 0.6 MG  tablet; Take 2 tablets (1.2 mg) at the onset of a gout flare, may repeat 1 tablet (0.6 mg) in one hour if symptoms persist.  Dispense: 30 tablet; Refill: 2  2. Acute idiopathic gout of right foot Advised to avoid foods that trigger gout.  3. Reduced appetite Boxes of Glucerna provided for him He is tachycardic-this could be secondary to hypoventilation from pain    Meds ordered this encounter  Medications  . predniSONE (DELTASONE) 20 MG tablet    Sig: Take 1 tablet (20 mg total) by mouth daily with breakfast.    Dispense:  5 tablet    Refill:  0  . traMADol (ULTRAM) 50 MG tablet    Sig: Take 1 tablet (50 mg total) by mouth every 12 (twelve) hours as needed.    Dispense:  60 tablet    Refill:  1  . colchicine 0.6 MG tablet    Sig: Take 2 tablets (1.2 mg) at the onset of a gout flare, may repeat 1 tablet (0.6 mg) in one hour if symptoms persist.    Dispense:  30 tablet    Refill:  2    Follow-up: Return in about 3 months (around 11/07/2016) for Follow-up on diabetes mellitus.   [name] MD

## 2016-08-08 NOTE — Progress Notes (Signed)
Pt is in today for foot pain  Pt has not eaten  Pt has had his medications  Pt consents to getting flu vaccine today

## 2016-08-08 NOTE — Patient Instructions (Signed)

## 2016-08-11 DIAGNOSIS — R634 Abnormal weight loss: Secondary | ICD-10-CM | POA: Diagnosis not present

## 2016-08-11 DIAGNOSIS — R631 Polydipsia: Secondary | ICD-10-CM | POA: Diagnosis not present

## 2016-08-11 DIAGNOSIS — D631 Anemia in chronic kidney disease: Secondary | ICD-10-CM | POA: Diagnosis not present

## 2016-08-11 DIAGNOSIS — N183 Chronic kidney disease, stage 3 (moderate): Secondary | ICD-10-CM | POA: Diagnosis not present

## 2016-08-11 DIAGNOSIS — I509 Heart failure, unspecified: Secondary | ICD-10-CM | POA: Diagnosis not present

## 2016-08-11 DIAGNOSIS — I10 Essential (primary) hypertension: Secondary | ICD-10-CM | POA: Diagnosis not present

## 2016-08-27 MED FILL — FOLIC ACID 1 MG TABLET: 1 | 30 days supply | Qty: 30 | Fill #2

## 2016-08-27 MED FILL — POTASSIUM CL ER 20 MEQ TAB: 20 | 30 days supply | Qty: 30 | Fill #5

## 2016-08-27 MED FILL — ATORVASTATIN 20 MG TABLET: 20 | 30 days supply | Qty: 30 | Fill #2

## 2016-08-27 MED FILL — WARFARIN NA 2.5 MG TAB: 2.5 | 20 days supply | Qty: 40 | Fill #1

## 2016-08-27 MED FILL — OMEPRAZOLE DR 20 MG CAPSULE: 20 | 30 days supply | Qty: 30 | Fill #2

## 2016-09-03 ENCOUNTER — Ambulatory Visit (INDEPENDENT_AMBULATORY_CARE_PROVIDER_SITE_OTHER): Payer: Medicare Other | Admitting: *Deleted

## 2016-09-03 DIAGNOSIS — Z5181 Encounter for therapeutic drug level monitoring: Secondary | ICD-10-CM | POA: Diagnosis not present

## 2016-09-03 DIAGNOSIS — I079 Rheumatic tricuspid valve disease, unspecified: Secondary | ICD-10-CM

## 2016-09-03 DIAGNOSIS — I4891 Unspecified atrial fibrillation: Secondary | ICD-10-CM

## 2016-09-03 DIAGNOSIS — I059 Rheumatic mitral valve disease, unspecified: Secondary | ICD-10-CM | POA: Diagnosis not present

## 2016-09-03 LAB — POCT INR: INR: 1.3

## 2016-09-15 ENCOUNTER — Ambulatory Visit (INDEPENDENT_AMBULATORY_CARE_PROVIDER_SITE_OTHER): Payer: Medicare Other | Admitting: Pharmacist

## 2016-09-15 DIAGNOSIS — I059 Rheumatic mitral valve disease, unspecified: Secondary | ICD-10-CM | POA: Diagnosis not present

## 2016-09-15 DIAGNOSIS — I079 Rheumatic tricuspid valve disease, unspecified: Secondary | ICD-10-CM | POA: Diagnosis not present

## 2016-09-15 DIAGNOSIS — I4891 Unspecified atrial fibrillation: Secondary | ICD-10-CM | POA: Diagnosis not present

## 2016-09-15 DIAGNOSIS — Z5181 Encounter for therapeutic drug level monitoring: Secondary | ICD-10-CM

## 2016-09-15 LAB — POCT INR: INR: 1.3

## 2016-09-16 ENCOUNTER — Encounter: Payer: Self-pay | Admitting: Family Medicine

## 2016-09-16 ENCOUNTER — Ambulatory Visit: Payer: Medicare Other | Attending: Family Medicine | Admitting: Family Medicine

## 2016-09-16 VITALS — BP 94/63 | HR 71 | Temp 98.1°F | Resp 16 | Wt 168.0 lb

## 2016-09-16 DIAGNOSIS — Z7901 Long term (current) use of anticoagulants: Secondary | ICD-10-CM | POA: Diagnosis not present

## 2016-09-16 DIAGNOSIS — Z794 Long term (current) use of insulin: Secondary | ICD-10-CM | POA: Diagnosis not present

## 2016-09-16 DIAGNOSIS — E1121 Type 2 diabetes mellitus with diabetic nephropathy: Secondary | ICD-10-CM | POA: Diagnosis not present

## 2016-09-16 DIAGNOSIS — M1000 Idiopathic gout, unspecified site: Secondary | ICD-10-CM | POA: Insufficient documentation

## 2016-09-16 DIAGNOSIS — I482 Chronic atrial fibrillation, unspecified: Secondary | ICD-10-CM

## 2016-09-16 DIAGNOSIS — Z952 Presence of prosthetic heart valve: Secondary | ICD-10-CM | POA: Insufficient documentation

## 2016-09-16 DIAGNOSIS — Z23 Encounter for immunization: Secondary | ICD-10-CM | POA: Diagnosis not present

## 2016-09-16 DIAGNOSIS — M10042 Idiopathic gout, left hand: Secondary | ICD-10-CM

## 2016-09-16 DIAGNOSIS — N184 Chronic kidney disease, stage 4 (severe): Secondary | ICD-10-CM | POA: Diagnosis not present

## 2016-09-16 DIAGNOSIS — Z79899 Other long term (current) drug therapy: Secondary | ICD-10-CM | POA: Diagnosis not present

## 2016-09-16 DIAGNOSIS — E785 Hyperlipidemia, unspecified: Secondary | ICD-10-CM | POA: Diagnosis not present

## 2016-09-16 DIAGNOSIS — E1122 Type 2 diabetes mellitus with diabetic chronic kidney disease: Secondary | ICD-10-CM | POA: Diagnosis not present

## 2016-09-16 DIAGNOSIS — I5022 Chronic systolic (congestive) heart failure: Secondary | ICD-10-CM | POA: Insufficient documentation

## 2016-09-16 DIAGNOSIS — M79642 Pain in left hand: Secondary | ICD-10-CM | POA: Diagnosis not present

## 2016-09-16 DIAGNOSIS — I130 Hypertensive heart and chronic kidney disease with heart failure and stage 1 through stage 4 chronic kidney disease, or unspecified chronic kidney disease: Secondary | ICD-10-CM | POA: Insufficient documentation

## 2016-09-16 LAB — GLUCOSE, POCT (MANUAL RESULT ENTRY): POC GLUCOSE: 118 mg/dL — AB (ref 70–99)

## 2016-09-16 MED ORDER — PREDNISONE 20 MG PO TABS: 20.0000 mg | ORAL_TABLET | Freq: Every day | ORAL | 0 refills | Status: DC

## 2016-09-16 MED ORDER — ALLOPURINOL 300 MG PO TABS: 300.0000 mg | ORAL_TABLET | Freq: Every day | ORAL | 6 refills | Status: DC

## 2016-09-16 MED FILL — ?PREDNISONE 20 MG TABLET: 20 | 5 days supply | Qty: 5 | Fill #0

## 2016-09-16 MED FILL — ?ALLOPURINOL 300 MG TABLET: 300 MG | 30 days supply | Qty: 30 | Fill #0

## 2016-09-16 NOTE — Progress Notes (Signed)
Subjective:  Patient ID: Michael [name], male    DOB: 1961-08-17  Age: 55 y.o. MRN: [id number]  CC: Hand Pain   HPI Michael [name] is a 18 with a history of Type 2DM (A1c 6.2), hypertension, hyperlipidemia, mitral valve replacement with porcine valve, tricuspid valve repair, atrial fibrillation (on anticoagulation with Coumadin),Chronic systolic heartfailure  (EF 40 -45% from 01/2016), CKD stage IV, Gout who comes into the clinic for an acute visit complaining of left hand pain for the last 3 days. He has had associated swelling and symptoms are typical of his gout flare. Taking tramadol with no much relief. He was treated for a gout flare a month ago. Review of his medications indicate he is not taking allopurinol.   Past Medical History:  Diagnosis Date  . Anemia of chronic disease   . Asthma   . Chronic kidney disease (CKD), stage IV (severe) ([place])   . Chronic systolic CHF (congestive heart failure) (HCC)    a. prior EF normal; b. Echo 10/16: Mild LVH, EF 30-35%, mitral valve bioprosthesis present without evidence of stenosis or regurgitation, severe LAE, mild RVE with mild to moderately reduced RVSF, moderate RAE  . Coronary artery disease   . Diabetes mellitus type 2 in obese ([place])   . Diabetes mellitus without complication ([place])   . GERD (gastroesophageal reflux disease)   . H/O tricuspid valve repair 2012  . History of echocardiogram    Echo at [facility] in [facility] 4/12: mod LVH, EF 60-65%, mod LAE, tissue MVR ok, mod TR, mod pulmo HTN with RVSP 48 mmHg  . Hypertension   . Obstructive sleep apnea   . Paroxysmal atrial fibrillation (HCC)    s/p Maze procedure at time of MVR in 2011  . Pulmonary HTN   . 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 CARDIAC SURGERY       Outpatient Medications Prior to Visit  Medication Sig Dispense Refill  . acetaminophen (TYLENOL) 325 MG tablet Take 650 mg by mouth  every 6 (six) hours as needed for headache (pain). Reported on 01/10/2016    . atorvastatin (LIPITOR) 20 MG tablet Take 1 tablet (20 mg total) by mouth daily. 30 tablet 3  . colchicine 0.6 MG tablet Take 2 tablets (1.2 mg) at the onset of a gout flare, may repeat 1 tablet (0.6 mg) in one hour if symptoms persist. 30 tablet 2  . digoxin (LANOXIN) 0.125 MG tablet Take 1 tablet (0.125 mg total) by mouth daily. 30 tablet 11  . furosemide (LASIX) 80 MG tablet Take 1 tablet (80 mg total) by mouth 2 (two) times daily. 60 tablet 3  . Insulin Detemir (LEVEMIR FLEXTOUCH) 100 UNIT/ML Pen Inject 10 Units into the skin daily at 10 pm. 15 mL 11  . isosorbide mononitrate (IMDUR) 30 MG 24 hr tablet Take 0.5 tablets (15 mg total) by mouth daily. 30 tablet 11  . metoprolol (LOPRESSOR) 100 MG tablet Take 1 tablet (100 mg total) by mouth 2 (two) times daily. 60 tablet 3  . omeprazole (PRILOSEC) 20 MG capsule Take 1 capsule (20 mg total) by mouth daily. 30 capsule 3  . potassium chloride SA (K-DUR,KLOR-CON) 20 MEQ tablet Take 1 tablet (20 mEq total) by mouth daily. 30 tablet 11  . traMADol (ULTRAM) 50 MG tablet Take 1 tablet (50 mg total) by mouth every 12 (twelve) hours as needed. 60 tablet 1  . traZODone (DESYREL) 50 MG  tablet Take 1 tablet (50 mg total) by mouth at bedtime as needed for sleep. 30 tablet 3  . warfarin (COUMADIN) 2.5 MG tablet Take 1-2 tablets by mouth daily as directed by coumadin clinic 40 tablet 3  . Blood Glucose Monitoring Suppl (ACCU-CHEK AVIVA) device Use as Instructed 3 times daily 1 each 0  . diclofenac sodium (VOLTAREN) 1 % GEL Apply 4 g topically 4 (four) times daily. 100 g 0  . docusate sodium (COLACE) 100 MG capsule Take 100 mg by mouth 2 (two) times daily.    . feeding supplement, ENSURE ENLIVE, (ENSURE ENLIVE) LIQD Take 237 mLs by mouth 2 (two) times daily between meals. (Patient not taking: Reported on 09/16/2016) 237 mL 12  . ferrous sulfate 325 (65 FE) MG tablet Take 1 tablet (325 mg  total) by mouth daily with breakfast. (Patient not taking: Reported on 09/16/2016) 30 tablet 0  . glucose blood (ACCU-CHEK AVIVA) test strip Use 3 times daily as directed. 100 each 5  . HYDROcodone-acetaminophen (NORCO/VICODIN) 5-325 MG tablet Take 1 tablet by mouth every 4 (four) hours as needed for severe pain. (Patient not taking: Reported on 09/16/2016) 20 tablet 0  . Insulin Pen Needle 31G X 5 MM MISC 1 each by Does not apply route at bedtime. 30 each 5  . Lancet Devices (ACCU-CHEK SOFTCLIX) lancets Use as instructed 3 times daily 1 each 5  . predniSONE (DELTASONE) 20 MG tablet Take 1 tablet (20 mg total) by mouth daily with breakfast. 5 tablet 0   No facility-administered medications prior to visit.     ROS Review of Systems  Constitutional: Negative for activity change and appetite change.  HENT: Negative for sinus pressure and sore throat.   Respiratory: Negative for chest tightness, shortness of breath and wheezing.   Cardiovascular: Negative for chest pain and palpitations.  Gastrointestinal: Negative for abdominal distention, abdominal pain and constipation.  Genitourinary: Negative.   Musculoskeletal:       See history of present illness  Psychiatric/Behavioral: Negative for behavioral problems and dysphoric mood.    Objective:  BP 94/63   Pulse 71   Temp 98.1 F (36.7 C) (Oral)   Resp 16   Wt 168 lb (76.2 kg)   SpO2 100%   BMI 27.96 kg/m   BP/Weight 09/16/2016 08/08/2016 1/44/8185  Systolic BP 94 631 497  Diastolic BP 63 68 68  Wt. (Lbs) 168 165.6 173.6  BMI 27.96 27.56 28.89      Physical Exam Constitutional: He is oriented to person, place, and time. He appears well-developed and well-nourished.  Cardiovascular: Normal rate, normal heart sounds and intact distal pulses.   No murmur heard. Pulmonary/Chest: Effort normal and breath sounds normal. He has no wheezes. He has no rales. He exhibits no tenderness.  Abdominal: Soft. Bowel sounds are normal. He  exhibits no distension and no mass. There is no tenderness.  Musculoskeletal: Edema and tenderness of left third and fourth metacarpophalangeal joints.  Neurological: He is alert and oriented to person, place, and time.   Lab Results  Component Value Date   HGBA1C 6.2 06/17/2016    Assessment & Plan:   1. Type 2 diabetes mellitus with diabetic nephropathy, with long-term current use of insulin (HCC) Controlled with A1c of 6.2 - POCT glucose (manual entry)  2. Acute idiopathic gout of left hand Recurrent gout flares due to not being on prophylactic Medication Placed on prednisone 20 mg 5 days Once acute gout is resolved commence allopurinol  3. Chronic atrial  fibrillation with RVR (HCC) Currently on alternating doses of 5 mg and 2.5 mg as instructed by the Coumadin clinic ( Last INR was 1.3) Reduced dose of Coumadin to 2.5 mg daily to prevent supratherapeutic INR due to interaction with prednisone and allopurinol Keep appointment with the Coumadin clinic.   Meds ordered this encounter  Medications  . predniSONE (DELTASONE) 20 MG tablet    Sig: Take 1 tablet (20 mg total) by mouth daily with breakfast.    Dispense:  5 tablet    Refill:  0  . allopurinol (ZYLOPRIM) 300 MG tablet    Sig: Take 1 tablet (300 mg total) by mouth daily.    Dispense:  30 tablet    Refill:  6    Follow-up: Return in about 1 month (around 10/16/2016) for Follow-up on diabetes mellitus.   [name] MD

## 2016-09-16 NOTE — Patient Instructions (Signed)

## 2016-09-16 NOTE — Progress Notes (Signed)
Left hand pain x 3 days. Tramadol helps a little.

## 2016-10-04 DIAGNOSIS — M25569 Pain in unspecified knee: Secondary | ICD-10-CM | POA: Diagnosis not present

## 2016-10-04 DIAGNOSIS — R52 Pain, unspecified: Secondary | ICD-10-CM | POA: Diagnosis not present

## 2016-10-05 ENCOUNTER — Emergency Department (HOSPITAL_COMMUNITY): Payer: Medicare Other

## 2016-10-05 ENCOUNTER — Inpatient Hospital Stay (HOSPITAL_COMMUNITY)
Admission: EM | Admit: 2016-10-05 | Discharge: 2016-10-07 | DRG: 556 | Disposition: A | Discharge status: Home / Self Care | Payer: Medicare Other | Attending: Nephrology | Admitting: Nephrology

## 2016-10-05 ENCOUNTER — Encounter (HOSPITAL_COMMUNITY): Payer: Self-pay | Admitting: *Deleted

## 2016-10-05 DIAGNOSIS — Z7901 Long term (current) use of anticoagulants: Secondary | ICD-10-CM | POA: Diagnosis not present

## 2016-10-05 DIAGNOSIS — M25561 Pain in right knee: Secondary | ICD-10-CM | POA: Diagnosis present

## 2016-10-05 DIAGNOSIS — M25462 Effusion, left knee: Secondary | ICD-10-CM | POA: Diagnosis present

## 2016-10-05 DIAGNOSIS — T45515A Adverse effect of anticoagulants, initial encounter: Secondary | ICD-10-CM | POA: Diagnosis present

## 2016-10-05 DIAGNOSIS — M109 Gout, unspecified: Secondary | ICD-10-CM | POA: Diagnosis present

## 2016-10-05 DIAGNOSIS — E785 Hyperlipidemia, unspecified: Secondary | ICD-10-CM

## 2016-10-05 DIAGNOSIS — S01512A Laceration without foreign body of oral cavity, initial encounter: Secondary | ICD-10-CM | POA: Diagnosis not present

## 2016-10-05 DIAGNOSIS — I481 Persistent atrial fibrillation: Secondary | ICD-10-CM | POA: Diagnosis present

## 2016-10-05 DIAGNOSIS — K7460 Unspecified cirrhosis of liver: Secondary | ICD-10-CM | POA: Diagnosis present

## 2016-10-05 DIAGNOSIS — I130 Hypertensive heart and chronic kidney disease with heart failure and stage 1 through stage 4 chronic kidney disease, or unspecified chronic kidney disease: Secondary | ICD-10-CM | POA: Diagnosis present

## 2016-10-05 DIAGNOSIS — I2510 Atherosclerotic heart disease of native coronary artery without angina pectoris: Secondary | ICD-10-CM | POA: Diagnosis present

## 2016-10-05 DIAGNOSIS — R233 Spontaneous ecchymoses: Secondary | ICD-10-CM | POA: Diagnosis present

## 2016-10-05 DIAGNOSIS — N184 Chronic kidney disease, stage 4 (severe): Secondary | ICD-10-CM

## 2016-10-05 DIAGNOSIS — K219 Gastro-esophageal reflux disease without esophagitis: Secondary | ICD-10-CM | POA: Diagnosis present

## 2016-10-05 DIAGNOSIS — D638 Anemia in other chronic diseases classified elsewhere: Secondary | ICD-10-CM | POA: Diagnosis present

## 2016-10-05 DIAGNOSIS — E1122 Type 2 diabetes mellitus with diabetic chronic kidney disease: Secondary | ICD-10-CM | POA: Diagnosis present

## 2016-10-05 DIAGNOSIS — R262 Difficulty in walking, not elsewhere classified: Secondary | ICD-10-CM | POA: Diagnosis present

## 2016-10-05 DIAGNOSIS — I480 Paroxysmal atrial fibrillation: Secondary | ICD-10-CM | POA: Diagnosis present

## 2016-10-05 DIAGNOSIS — Z87891 Personal history of nicotine dependence: Secondary | ICD-10-CM

## 2016-10-05 DIAGNOSIS — Y92009 Unspecified place in unspecified non-institutional (private) residence as the place of occurrence of the external cause: Secondary | ICD-10-CM | POA: Diagnosis not present

## 2016-10-05 DIAGNOSIS — M25562 Pain in left knee: Secondary | ICD-10-CM | POA: Diagnosis not present

## 2016-10-05 DIAGNOSIS — I482 Chronic atrial fibrillation: Secondary | ICD-10-CM

## 2016-10-05 DIAGNOSIS — I10 Essential (primary) hypertension: Secondary | ICD-10-CM | POA: Diagnosis not present

## 2016-10-05 DIAGNOSIS — R918 Other nonspecific abnormal finding of lung field: Secondary | ICD-10-CM | POA: Diagnosis not present

## 2016-10-05 DIAGNOSIS — M7989 Other specified soft tissue disorders: Secondary | ICD-10-CM | POA: Diagnosis not present

## 2016-10-05 DIAGNOSIS — I5022 Chronic systolic (congestive) heart failure: Secondary | ICD-10-CM | POA: Diagnosis not present

## 2016-10-05 DIAGNOSIS — Z953 Presence of xenogenic heart valve: Secondary | ICD-10-CM

## 2016-10-05 DIAGNOSIS — I5023 Acute on chronic systolic (congestive) heart failure: Secondary | ICD-10-CM

## 2016-10-05 DIAGNOSIS — Z794 Long term (current) use of insulin: Secondary | ICD-10-CM | POA: Diagnosis not present

## 2016-10-05 DIAGNOSIS — K1379 Other lesions of oral mucosa: Secondary | ICD-10-CM

## 2016-10-05 DIAGNOSIS — Z79891 Long term (current) use of opiate analgesic: Secondary | ICD-10-CM

## 2016-10-05 DIAGNOSIS — M10042 Idiopathic gout, left hand: Secondary | ICD-10-CM | POA: Diagnosis not present

## 2016-10-05 DIAGNOSIS — E118 Type 2 diabetes mellitus with unspecified complications: Secondary | ICD-10-CM | POA: Diagnosis present

## 2016-10-05 DIAGNOSIS — I959 Hypotension, unspecified: Secondary | ICD-10-CM | POA: Diagnosis not present

## 2016-10-05 DIAGNOSIS — Z7952 Long term (current) use of systemic steroids: Secondary | ICD-10-CM

## 2016-10-05 DIAGNOSIS — I4891 Unspecified atrial fibrillation: Secondary | ICD-10-CM | POA: Diagnosis present

## 2016-10-05 HISTORY — DX: Hyperlipidemia, unspecified: E78.5

## 2016-10-05 LAB — COMPREHENSIVE METABOLIC PANEL
ALT: 7 U/L — ABNORMAL LOW (ref 17–63)
AST: 24 U/L (ref 15–41)
Albumin: 3.1 g/dL — ABNORMAL LOW (ref 3.5–5.0)
Alkaline Phosphatase: 129 U/L — ABNORMAL HIGH (ref 38–126)
Anion gap: 11 (ref 5–15)
BUN: 35 mg/dL — ABNORMAL HIGH (ref 6–20)
CO2: 27 mmol/L (ref 22–32)
Calcium: 9.1 mg/dL (ref 8.9–10.3)
Chloride: 97 mmol/L — ABNORMAL LOW (ref 101–111)
Creatinine, Ser: 2.35 mg/dL — ABNORMAL HIGH (ref 0.61–1.24)
GFR calc Af Amer: 34 mL/min — ABNORMAL LOW (ref >60)
GFR calc non Af Amer: 29 mL/min — ABNORMAL LOW (ref >60)
Glucose, Bld: 148 mg/dL — ABNORMAL HIGH (ref 65–99)
Potassium: 3.9 mmol/L (ref 3.5–5.1)
Sodium: 135 mmol/L (ref 135–145)
Total Bilirubin: 0.7 mg/dL (ref 0.3–1.2)
Total Protein: 8.2 g/dL — ABNORMAL HIGH (ref 6.5–8.1)

## 2016-10-05 LAB — CBC WITH DIFFERENTIAL/PLATELET
Basophils Absolute: 0 10*3/uL (ref 0.0–0.1)
Basophils Relative: 0 %
EOS ABS: 0.2 10*3/uL (ref 0.0–0.7)
Eosinophils Relative: 3 %
HEMATOCRIT: 32.6 % — AB (ref 39.0–52.0)
HEMOGLOBIN: 10.7 g/dL — AB (ref 13.0–17.0)
LYMPHS ABS: 1 10*3/uL (ref 0.7–4.0)
LYMPHS PCT: 13 %
MCH: 24.7 pg — AB (ref 26.0–34.0)
MCHC: 32.8 g/dL (ref 30.0–36.0)
MCV: 75.1 fL — ABNORMAL LOW (ref 78.0–100.0)
Monocytes Absolute: 1 10*3/uL (ref 0.1–1.0)
Monocytes Relative: 12 %
NEUTROS ABS: 5.7 10*3/uL (ref 1.7–7.7)
NEUTROS PCT: 72 %
Platelets: 303 10*3/uL (ref 150–400)
RBC: 4.34 MIL/uL (ref 4.22–5.81)
RDW: 14.8 % (ref 11.5–15.5)
WBC: 7.8 10*3/uL (ref 4.0–10.5)

## 2016-10-05 LAB — GLUCOSE, CAPILLARY
GLUCOSE-CAPILLARY: 312 mg/dL — AB (ref 65–99)
GLUCOSE-CAPILLARY: 190 mg/dL — AB (ref 65–99)
Glucose-Capillary: 247 mg/dL — ABNORMAL HIGH (ref 65–99)

## 2016-10-05 LAB — TYPE AND SCREEN
ABO/RH(D): B POS
Antibody Screen: NEGATIVE

## 2016-10-05 LAB — RPR: RPR: NONREACTIVE

## 2016-10-05 LAB — AMMONIA: Ammonia: 34 umol/L (ref 9–35)

## 2016-10-05 LAB — ABO/RH: ABO/RH(D): B POS

## 2016-10-05 LAB — CBG MONITORING, ED: Glucose-Capillary: 157 mg/dL — ABNORMAL HIGH (ref 65–99)

## 2016-10-05 LAB — URIC ACID: URIC ACID, SERUM: 4.6 mg/dL (ref 4.4–7.6)

## 2016-10-05 LAB — BRAIN NATRIURETIC PEPTIDE: B NATRIURETIC PEPTIDE 5: 237.4 pg/mL — AB (ref 0.0–100.0)

## 2016-10-05 LAB — PROTIME-INR
INR: 1.93
Prothrombin Time: 22.4 seconds — ABNORMAL HIGH (ref 11.4–15.2)

## 2016-10-05 LAB — HIV ANTIBODY (ROUTINE TESTING W REFLEX): HIV SCREEN 4TH GENERATION: NONREACTIVE

## 2016-10-05 MED ORDER — METHYLPREDNISOLONE SODIUM SUCC 125 MG IJ SOLR: 60.0000 mg | Freq: Once | INTRAMUSCULAR | Status: DC

## 2016-10-05 MED ORDER — INSULIN ASPART 100 UNIT/ML ~~LOC~~ SOLN: 0.0000 [IU] | Freq: Three times a day (TID) | SUBCUTANEOUS | Status: DC

## 2016-10-05 MED ORDER — ACETAMINOPHEN 650 MG RE SUPP: 650.0000 mg | Freq: Four times a day (QID) | RECTAL | Status: DC | PRN

## 2016-10-05 MED ORDER — TRANEXAMIC ACID 1000 MG/10ML IV SOLN
500.0000 mg | Freq: Once | INTRAVENOUS | Status: AC
  Administered 2016-10-05: 500 mg via TOPICAL
  Filled 2016-10-05: qty 10

## 2016-10-05 MED ORDER — PREDNISONE 20 MG PO TABS
20.0000 mg | ORAL_TABLET | Freq: Every day | ORAL | Status: DC
  Administered 2016-10-05: 20 mg via ORAL
  Filled 2016-10-05: qty 1

## 2016-10-05 MED ORDER — SODIUM CHLORIDE 0.9% FLUSH
3.0000 mL | Freq: Two times a day (BID) | INTRAVENOUS | Status: DC
  Administered 2016-10-05 – 2016-10-06 (×4): 3 mL via INTRAVENOUS

## 2016-10-05 MED ORDER — INSULIN DETEMIR 100 UNIT/ML FLEXPEN: 2.0000 [IU] | PEN_INJECTOR | Freq: Every day | SUBCUTANEOUS | Status: DC

## 2016-10-05 MED ORDER — METOPROLOL TARTRATE 50 MG PO TABS
50.0000 mg | ORAL_TABLET | Freq: Two times a day (BID) | ORAL | Status: DC
  Administered 2016-10-05 – 2016-10-06 (×3): 50 mg via ORAL
  Filled 2016-10-05 (×3): qty 1

## 2016-10-05 MED ORDER — COLCHICINE 0.6 MG PO TABS
0.6000 mg | ORAL_TABLET | Freq: Two times a day (BID) | ORAL | Status: DC
  Administered 2016-10-05 – 2016-10-07 (×5): 0.6 mg via ORAL
  Filled 2016-10-05 (×5): qty 1

## 2016-10-05 MED ORDER — ACETAMINOPHEN 325 MG PO TABS: 650.0000 mg | ORAL_TABLET | Freq: Four times a day (QID) | ORAL | Status: DC | PRN

## 2016-10-05 MED ORDER — INSULIN ASPART 100 UNIT/ML ~~LOC~~ SOLN
0.0000 [IU] | Freq: Three times a day (TID) | SUBCUTANEOUS | Status: DC
  Administered 2016-10-05: 2 [IU] via SUBCUTANEOUS
  Administered 2016-10-05: 3 [IU] via SUBCUTANEOUS
  Administered 2016-10-05 – 2016-10-06 (×2): 7 [IU] via SUBCUTANEOUS
  Administered 2016-10-06: 5 [IU] via SUBCUTANEOUS
  Administered 2016-10-07 (×2): 3 [IU] via SUBCUTANEOUS
  Administered 2016-10-07: 2 [IU] via SUBCUTANEOUS
  Filled 2016-10-05: qty 1

## 2016-10-05 MED ORDER — FUROSEMIDE 10 MG/ML IJ SOLN
40.0000 mg | Freq: Once | INTRAMUSCULAR | Status: AC
  Administered 2016-10-05: 40 mg via INTRAVENOUS
  Filled 2016-10-05: qty 4

## 2016-10-05 MED ORDER — FOLIC ACID 1 MG PO TABS
1.0000 mg | ORAL_TABLET | Freq: Every day | ORAL | Status: DC
  Administered 2016-10-05 – 2016-10-07 (×3): 1 mg via ORAL
  Filled 2016-10-05 (×3): qty 1

## 2016-10-05 MED ORDER — PREDNISONE 50 MG PO TABS
50.0000 mg | ORAL_TABLET | Freq: Every day | ORAL | Status: DC
  Administered 2016-10-06 – 2016-10-07 (×2): 50 mg via ORAL
  Filled 2016-10-05 (×2): qty 1

## 2016-10-05 MED ORDER — ATORVASTATIN CALCIUM 20 MG PO TABS
20.0000 mg | ORAL_TABLET | Freq: Every day | ORAL | Status: DC
Start: 2016-10-05 — End: 2016-10-07
  Administered 2016-10-05 – 2016-10-07 (×3): 20 mg via ORAL
  Filled 2016-10-05 (×3): qty 1

## 2016-10-05 MED ORDER — METOPROLOL TARTRATE 5 MG/5ML IV SOLN: 5.0000 mg | Freq: Four times a day (QID) | INTRAVENOUS | Status: DC | PRN

## 2016-10-05 MED ORDER — INSULIN GLARGINE 100 UNIT/ML ~~LOC~~ SOLN
5.0000 [IU] | Freq: Every day | SUBCUTANEOUS | Status: DC
  Administered 2016-10-05 – 2016-10-06 (×2): 5 [IU] via SUBCUTANEOUS
  Filled 2016-10-05 (×3): qty 0.05

## 2016-10-05 MED ORDER — ZOLPIDEM TARTRATE 5 MG PO TABS: 5.0000 mg | ORAL_TABLET | Freq: Every evening | ORAL | Status: DC | PRN

## 2016-10-05 MED ORDER — ALLOPURINOL 300 MG PO TABS
300.0000 mg | ORAL_TABLET | Freq: Every day | ORAL | Status: DC
Start: 2016-10-05 — End: 2016-10-07
  Administered 2016-10-05 – 2016-10-07 (×3): 300 mg via ORAL
  Filled 2016-10-05 (×3): qty 1

## 2016-10-05 MED ORDER — PANTOPRAZOLE SODIUM 40 MG PO TBEC
40.0000 mg | DELAYED_RELEASE_TABLET | Freq: Every day | ORAL | Status: DC
  Administered 2016-10-05 – 2016-10-07 (×3): 40 mg via ORAL
  Filled 2016-10-05 (×3): qty 1

## 2016-10-05 MED ORDER — FUROSEMIDE 80 MG PO TABS
80.0000 mg | ORAL_TABLET | Freq: Two times a day (BID) | ORAL | Status: DC
Start: 2016-10-05 — End: 2016-10-05
  Administered 2016-10-05: 80 mg via ORAL
  Filled 2016-10-05: qty 4

## 2016-10-05 MED ORDER — OXYCODONE-ACETAMINOPHEN 5-325 MG PO TABS
2.0000 | ORAL_TABLET | Freq: Once | ORAL | Status: AC
  Administered 2016-10-05: 2 via ORAL
  Filled 2016-10-05: qty 2

## 2016-10-05 MED ORDER — HYDROCODONE-ACETAMINOPHEN 5-325 MG PO TABS
1.0000 | ORAL_TABLET | ORAL | Status: DC | PRN
  Administered 2016-10-05 – 2016-10-06 (×5): 1 via ORAL
  Filled 2016-10-05 (×5): qty 1

## 2016-10-05 MED ORDER — INSULIN ASPART 100 UNIT/ML ~~LOC~~ SOLN: 0.0000 [IU] | Freq: Every day | SUBCUTANEOUS | Status: DC

## 2016-10-05 MED ORDER — METOPROLOL TARTRATE 100 MG PO TABS: 100.0000 mg | ORAL_TABLET | Freq: Two times a day (BID) | ORAL | Status: DC

## 2016-10-05 MED ORDER — WARFARIN SODIUM 2.5 MG PO TABS
2.5000 mg | ORAL_TABLET | Freq: Once | ORAL | Status: AC
  Administered 2016-10-05: 2.5 mg via ORAL
  Filled 2016-10-05: qty 1

## 2016-10-05 MED ORDER — WARFARIN - PHARMACIST DOSING INPATIENT
Freq: Every day | Status: DC
Start: 2016-10-05 — End: 2016-10-07

## 2016-10-05 NOTE — ED Provider Notes (Signed)
Michael [name] Provider Note   CSN: [id number] Arrival date & time: 10/05/16  0032  By signing my name below, I, Irene Pap, attest that this documentation has been prepared under the direction and in the presence of Ezequiel Essex, MD. Electronically Signed: Irene Pap, ED Scribe. 10/05/16. 12:54 AM.  History   Chief Complaint Chief Complaint  Patient presents with  . Knee Pain  . Bleeding from Mouth   The history is provided by the patient. No language interpreter was used.   HPI Comments: Michael [name] is a 55 y.o. male with a hx of CKD, CHF, DM, HTN, CAD, PAF, and cirrhosis of the liver who presents to the Emergency Department complaining of bleeding from the mouth onset earlier this evening. He says he does not know the cause of the bleeding. Pt is on Coumadin. He reports hx of similar symptoms but does not know what happened at that time. Pt reports bilateral knee pain that began 3 days ago. Pt has a hx of gout. He says he is unable to walk due to pain. He denies fever, bleeding from ears or nose, SOB, chest pain, abdominal pain, or headache.   Past Medical History:  Diagnosis Date  . Anemia of chronic disease   . Asthma   . Chronic kidney disease (CKD), stage IV (severe) ([place])   . Chronic systolic CHF (congestive heart failure) (HCC)    a. prior EF normal; b. Echo 10/16: Mild LVH, EF 30-35%, mitral valve bioprosthesis present without evidence of stenosis or regurgitation, severe LAE, mild RVE with mild to moderately reduced RVSF, moderate RAE  . Coronary artery disease   . Diabetes mellitus type 2 in obese ([place])   . Diabetes mellitus without complication ([place])   . GERD (gastroesophageal reflux disease)   . H/O tricuspid valve repair 2012  . History of echocardiogram    Echo at [facility] in [facility] 4/12: mod LVH, EF 60-65%, mod LAE, tissue MVR ok, mod TR, mod pulmo HTN with RVSP 48 mmHg  . Hypertension   . Obstructive sleep apnea   . Paroxysmal atrial  fibrillation (HCC)    s/p Maze procedure at time of MVR in 2011  . Pulmonary HTN   . S/P mitral valve replacement with porcine valve 2012    Patient Active Problem List   Diagnosis Date Noted  . Insomnia 06/10/2016  . Supratherapeutic INR 06/01/2016  . Acute hepatic encephalopathy 03/15/2016  . Liver cirrhosis ([place]) 03/15/2016  . Volume overload 03/15/2016  . Acute congestive heart failure ([place])   . Insulin dependent diabetes mellitus ([place])   . Acute renal failure (ARF) ([place]) 03/14/2016  . GERD (gastroesophageal reflux disease) 03/04/2016  . Aortic regurgitation 02/27/2016  . Viral gastroenteritis 02/27/2016  . A-fib ([place]) 02/26/2016  . Abdominal pain 02/25/2016  . Diabetes mellitus with complication ([place]) 81/44/8185  . Chronic atrial fibrillation with RVR ([place]) 02/25/2016  . Acute on chronic systolic CHF (congestive heart failure) ([place]) 02/25/2016  . Anemia, chronic disease 02/25/2016  . Cirrhosis ([place])   . Atrial fibrillation with RVR ([place]) 02/19/2016  . Pain in joint, ankle and foot 01/10/2016  . Asthma   . Acute hypoxemic respiratory failure ([place]) 12/29/2015  . Acute respiratory failure ([place]) 12/29/2015  . Chronic atrial fibrillation ([place]) 12/24/2015  . Dilated cardiomyopathy ([place]) 12/24/2015  . Polypharmacy 11/23/2015  . Gout 10/23/2015  . Encounter for therapeutic drug monitoring 10/19/2015  . Anemia - mild exacerbation 10/15/2015  . Chronic systolic CHF (congestive heart failure) ([place]) 10/09/2015  .  Tricuspid valve disease 10/09/2015  . Mitral valve disease   . CKD (chronic kidney disease), stage IV ([place]) 09/10/2015  . Calf pain 09/10/2015  . DM type 2 (diabetes mellitus, type 2) ([place]) 09/10/2015  . Essential hypertension 09/10/2015    Past Surgical History:  Procedure Laterality Date  . CARDIAC SURGERY       Home Medications    Prior to Admission medications   Medication Sig Start Date End Date Taking? Authorizing Provider  acetaminophen (TYLENOL) 325  MG tablet Take 650 mg by mouth every 6 (six) hours as needed for headache (pain). Reported on 01/10/2016    Historical Provider, MD  allopurinol (ZYLOPRIM) 300 MG tablet Take 1 tablet (300 mg total) by mouth daily. 09/16/16   [name], MD  atorvastatin (LIPITOR) 20 MG tablet Take 1 tablet (20 mg total) by mouth daily. 07/01/16   [name], MD  Blood Glucose Monitoring Suppl (ACCU-CHEK AVIVA) device Use as Instructed 3 times daily 01/24/16   [name], MD  colchicine 0.6 MG tablet Take 2 tablets (1.2 mg) at the onset of a gout flare, may repeat 1 tablet (0.6 mg) in one hour if symptoms persist. 08/08/16   [name], MD  diclofenac sodium (VOLTAREN) 1 % GEL Apply 4 g topically 4 (four) times daily. 06/02/16   Milagros Loll, MD  digoxin (LANOXIN) 0.125 MG tablet Take 1 tablet (0.125 mg total) by mouth daily. 04/01/16   [name], MD  docusate sodium (COLACE) 100 MG capsule Take 100 mg by mouth 2 (two) times daily.    Historical Provider, MD  feeding supplement, ENSURE ENLIVE, (ENSURE ENLIVE) LIQD Take 237 mLs by mouth 2 (two) times daily between meals. Patient not taking: Reported on 09/16/2016 06/02/16   Juliet Rude, MD  ferrous sulfate 325 (65 FE) MG tablet Take 1 tablet (325 mg total) by mouth daily with breakfast. Patient not taking: Reported on 09/16/2016 06/02/16   Milagros Loll, MD  furosemide (LASIX) 80 MG tablet Take 1 tablet (80 mg total) by mouth 2 (two) times daily. 06/17/16   [name], MD  glucose blood (ACCU-CHEK AVIVA) test strip Use 3 times daily as directed. 02/04/16   [name], MD  HYDROcodone-acetaminophen (NORCO/VICODIN) 5-325 MG tablet Take 1 tablet by mouth every 4 (four) hours as needed for severe pain. Patient not taking: Reported on 09/16/2016 06/02/16   Milagros Loll, MD  Insulin Detemir (LEVEMIR FLEXTOUCH) 100 UNIT/ML Pen Inject 10 Units into the skin daily at 10 pm. 06/17/16   [name], MD  Insulin Pen Needle 31G X 5 MM MISC 1 each by Does not apply  route at bedtime. 06/17/16   [name], MD  isosorbide mononitrate (IMDUR) 30 MG 24 hr tablet Take 0.5 tablets (15 mg total) by mouth daily. 10/24/15   Liliane Shi, PA-C  Lancet Devices [facility]) lancets Use as instructed 3 times daily 01/24/16   [name], MD  metoprolol (LOPRESSOR) 100 MG tablet Take 1 tablet (100 mg total) by mouth 2 (two) times daily. 06/17/16   [name], MD  omeprazole (PRILOSEC) 20 MG capsule Take 1 capsule (20 mg total) by mouth daily. 06/17/16   [name], MD  potassium chloride SA (K-DUR,KLOR-CON) 20 MEQ tablet Take 1 tablet (20 mEq total) by mouth daily. 10/24/15   Liliane Shi, PA-C  predniSONE (DELTASONE) 20 MG tablet Take 1 tablet (20 mg total) by mouth daily with breakfast. 09/16/16   [name], MD  traMADol Veatrice [place]bon)  50 MG tablet Take 1 tablet (50 mg total) by mouth every 12 (twelve) hours as needed. 08/08/16   [name], MD  traZODone (DESYREL) 50 MG tablet Take 1 tablet (50 mg total) by mouth at bedtime as needed for sleep. 06/10/16   [name], MD  warfarin (COUMADIN) 2.5 MG tablet Take 1-2 tablets by mouth daily as directed by coumadin clinic 08/08/16   Fay Records, MD    Family History Family History  Problem Relation Age of Onset  . Heart attack Neg Hx     Social History Social History  Substance Use Topics  . Smoking status: Former Research scientist (life sciences)  . Smokeless tobacco: Former Systems developer  . Alcohol use No     Allergies   Patient has no known allergies.   Review of Systems Review of Systems  Constitutional: Negative for fever.  HENT: Negative for ear discharge and nosebleeds.   Respiratory: Negative for shortness of breath.   Cardiovascular: Negative for chest pain.  Gastrointestinal: Negative for abdominal pain.  Musculoskeletal: Positive for arthralgias.  Skin: Positive for wound.  Neurological: Negative for headaches.  All other systems reviewed and are negative.  Physical Exam Updated Vital Signs BP 134/83 (BP Location:  Left Arm)   Pulse 96   Temp 97.7 F (36.5 C) (Oral)   Resp 18   Wt 170 lb (77.1 kg)   SpO2 100%   BMI 28.29 kg/m   Physical Exam  Constitutional: He is oriented to person, place, and time. He appears well-developed and well-nourished. No distress.  HENT:  Head: Normocephalic and atraumatic.  Mouth/Throat: Oropharynx is clear and moist. No oropharyngeal exudate.  Left anterior tongue has a briskly bleeding 0.25 cm laceration. There is also bleeding to the left lower gingiva.   Eyes: Conjunctivae and EOM are normal. Pupils are equal, round, and reactive to light.  Neck: Normal range of motion. Neck supple.  No meningismus.  Cardiovascular: Normal heart sounds and intact distal pulses.  An irregular rhythm present. Tachycardia present.   No murmur heard. Pulmonary/Chest: Effort normal and breath sounds normal. No respiratory distress.  Abdominal: Soft. There is no tenderness. There is no rebound and no guarding.  Musculoskeletal: He exhibits no edema or tenderness.       Right knee: He exhibits decreased range of motion and effusion. He exhibits no erythema.       Left knee: He exhibits decreased range of motion and effusion. He exhibits no erythema.  Small bilateral knee effusions; reduced ROM secondary to pain bilaterally; no erythema or warmth; intact DP pulses  Neurological: He is alert and oriented to person, place, and time. No cranial nerve deficit. He exhibits normal muscle tone. Coordination normal.   5/5 strength throughout. CN 2-12 intact.Equal grip strength.  Skin: Skin is warm.  Psychiatric: He has a normal mood and affect. His behavior is normal.  Nursing note and vitals reviewed.  ED Treatments / Results  DIAGNOSTIC STUDIES: Oxygen Saturation is 100% on RA, normal by my interpretation.    COORDINATION OF CARE: 12:51 AM-Discussed treatment plan which includes labs and EKG with pt at bedside and pt agreed to plan.   2:31 AM- bleeding subsided after TXA  application  Labs (all labs ordered are listed, but only abnormal results are displayed) Labs Reviewed  CBC WITH DIFFERENTIAL/PLATELET - Abnormal; Notable for the following:       Result Value   Hemoglobin 10.7 (*)    HCT 32.6 (*)    MCV 75.1 (*)  MCH 24.7 (*)    All other components within normal limits  COMPREHENSIVE METABOLIC PANEL - Abnormal; Notable for the following:    Chloride 97 (*)    Glucose, Bld 148 (*)    BUN 35 (*)    Creatinine, Ser 2.35 (*)    Total Protein 8.2 (*)    Albumin 3.1 (*)    ALT 7 (*)    Alkaline Phosphatase 129 (*)    GFR calc non Af Amer 29 (*)    GFR calc Af Amer 34 (*)    All other components within normal limits  PROTIME-INR - Abnormal; Notable for the following:    Prothrombin Time 22.4 (*)    All other components within normal limits  BRAIN NATRIURETIC PEPTIDE - Abnormal; Notable for the following:    B Natriuretic Peptide 237.4 (*)    All other components within normal limits  URIC ACID  TYPE AND SCREEN  ABO/RH    EKG  EKG Interpretation  Date/Time:  Sunday October 05 2016 00:34:24 EST Ventricular Rate:  108 PR Interval:    QRS Duration: 102 QT Interval:  382 QTC Calculation: 511 R Axis:   -23 Text Interpretation:  Atrial fibrillation with rapid ventricular response Incomplete right bundle branch block Nonspecific ST and T wave abnormality Abnormal ECG No significant change was found Confirmed by Wyvonnia Dusky  MD, Cindel Daugherty [phone]) on 10/05/2016 12:38:54 AM       Radiology Dg Chest 2 View  Result Date: 10/05/2016 CLINICAL DATA:  Active bleeding from the back of the mouth. Initial encounter. EXAM: CHEST  2 VIEW COMPARISON:  Chest radiograph performed 06/01/2016 FINDINGS: The lungs are well-aerated. Vascular congestion is noted. Increased interstitial markings raise concern for mild interstitial edema. Mild left midlung opacity could reflect superimposed pneumonia. Would correlate for associated symptoms. A small left pleural  effusion is noted. No pneumothorax is seen. The heart is enlarged. The patient is status post median sternotomy. No acute osseous abnormalities are seen. IMPRESSION: 1. Vascular congestion and cardiomegaly. Increased interstitial markings raise concern for mild pulmonary edema. 2. Mild left midlung opacity could reflect superimposed pneumonia. Would correlate for associated symptoms. Small left pleural effusion noted. Electronically Signed   By: Garald Balding M.D.   On: 10/05/2016 01:53   Dg Knee Complete 4 Views Left  Result Date: 10/05/2016 CLINICAL DATA:  Acute onset of bilateral knee pain and swelling. Initial encounter. EXAM: LEFT KNEE - COMPLETE 4+ VIEW COMPARISON:  None. FINDINGS: There is no evidence of fracture or dislocation. The joint spaces are preserved. No significant degenerative change is seen; the patellofemoral joint is grossly unremarkable in appearance. A small to moderate knee joint effusion is noted. The visualized soft tissues are otherwise unremarkable in appearance. IMPRESSION: 1. No evidence of fracture or dislocation. 2. Small to moderate knee joint effusion noted. Electronically Signed   By: Garald Balding M.D.   On: 10/05/2016 01:51   Dg Knee Complete 4 Views Right  Result Date: 10/05/2016 CLINICAL DATA:  Acute onset of bilateral knee pain and swelling. Initial encounter. EXAM: RIGHT KNEE - COMPLETE 4+ VIEW COMPARISON:  None. FINDINGS: There is no evidence of fracture or dislocation. The joint spaces are preserved. No significant degenerative change is seen; the patellofemoral joint is grossly unremarkable in appearance. No significant joint effusion is seen. The visualized soft tissues are normal in appearance. IMPRESSION: No evidence of fracture or dislocation. Electronically Signed   By: Garald Balding M.D.   On: 10/05/2016 01:51    Procedures  Procedures (including critical care time)  Medications Ordered in ED Medications - No data to display   Initial Impression  / Assessment and Plan / ED Course  I have reviewed the triage vital signs and the nursing notes.  Pertinent labs & imaging results that were available during my care of the patient were reviewed by me and considered in my medical decision making (see chart for details).  Clinical Course    Patient presents with bleeding from his tongue and lower gingiva. He denies trauma. He is on Coumadin. He also complains of bilateral knee pain consistent with his previous episodes of gout. No fever.  Patient with active bleeding from tongue laceration on arrival. Also with oozing from his gingiva. Tmax placed the patient unwilling to keep them in his mouth. Did apply TXA to patient's tongue with good hemostasis achieved.  Hemoglobin is stable. INR is 1.9. Patient with bilateral knee pain and minimal swelling. There is no erythema. There is reduced range of motion secondary to pain. Doubt septic joint. Uric acid normal. Per patient, consistent with previous gout attacks, will give NSAIDs and steroids.  Patient still refused to ambulate after pain medication. Does appear to have mild CHF exacerbation is given IV Lasix. He'll require admission due to his inability to ambulate and shortness of breath. D/w Dr Blaine Hamper.  LACERATION REPAIR Performed by: Ezequiel Essex Authorized by: Ezequiel Essex Consent: Verbal consent obtained. Risks and benefits: risks, benefits and alternatives were discussed Consent given by: patient Patient identity confirmed: provided demographic data Prepped and Draped in normal sterile fashion Wound explored  Laceration Location: tongue  Laceration Length: 0.5cm  No Foreign Bodies seen or palpated  Anesthesia: none  Irrigation method: syringe Amount of cleaning: standard  Skin closure: TXA application      Patient tolerance: Patient tolerated the procedure well with no immediate complications.   Final Clinical Impressions(s) / ED Diagnoses   Final diagnoses:   Laceration of tongue, initial encounter  Acute pain of both knees  Acute on chronic systolic congestive heart failure ([place])  I personally performed the services described in this documentation, which was scribed in my presence. The recorded information has been reviewed and is accurate.  New Prescriptions New Prescriptions   No medications on file     Ezequiel Essex, MD 10/05/16 [phone]

## 2016-10-05 NOTE — Progress Notes (Signed)
Called ED for report.  Spoke with Janett Billow.

## 2016-10-05 NOTE — ED Notes (Signed)
Patient transported to X-ray 

## 2016-10-05 NOTE — ED Triage Notes (Signed)
Patient presents with c/o bleeding from the mouth (appears to be from the back on the left side to the back of the tongue and knee pain that is ongoing  CBG 285

## 2016-10-05 NOTE — Progress Notes (Signed)
ANTICOAGULATION CONSULT NOTE  Pharmacy Consult for warfarin Indication: atrial fibrillation  No Known Allergies  Patient Measurements: Height: 5\' 5"  (165.1 cm) Weight: 168 lb (76.2 kg) IBW/kg (Calculated) : 61.5  Vital Signs: Temp: 97.7 F (36.5 C) (11/19 0038) Temp Source: Oral (11/19 0038) BP: 125/59 (11/19 0545) Pulse Rate: 95 (11/19 0545)  Labs:  Recent Labs  10/05/16 0147  HGB 10.7*  HCT 32.6*  PLT 303  LABPROT 22.4*  INR 1.93  CREATININE 2.35*    Estimated Creatinine Clearance: 33.5 mL/min (by C-G formula based on SCr of 2.35 mg/dL (H)).   Medical History: Past Medical History:  Diagnosis Date  . Anemia of chronic disease   . Asthma   . Chronic kidney disease (CKD), stage IV (severe) ([place])   . Chronic systolic CHF (congestive heart failure) (HCC)    a. prior EF normal; b. Echo 10/16: Mild LVH, EF 30-35%, mitral valve bioprosthesis present without evidence of stenosis or regurgitation, severe LAE, mild RVE with mild to moderately reduced RVSF, moderate RAE  . Coronary artery disease   . Diabetes mellitus type 2 in obese ([place])   . Diabetes mellitus without complication ([place])   . GERD (gastroesophageal reflux disease)   . H/O tricuspid valve repair 2012  . History of echocardiogram    Echo at [facility] in [facility] 4/12: mod LVH, EF 60-65%, mod LAE, tissue MVR ok, mod TR, mod pulmo HTN with RVSP 48 mmHg  . Hypertension   . Obstructive sleep apnea   . Paroxysmal atrial fibrillation (HCC)    s/p Maze procedure at time of MVR in 2011  . Pulmonary HTN   . S/P mitral valve replacement with porcine valve 2012    Assessment: 55 yo male on warfarin PTA for PAF. INR 1.93 on admission with stable CBC. Warfarin per pharmacy.   PTA dose: 5 mg on Monday, Wednesday and Saturday and 2.5 mg AOD's (recent dose increase)   Goal of Therapy:  INR 2-3 Monitor platelets by anticoagulation protocol: Yes   Plan:  1. Warfarin 2.5 mg x 1 this evening (home dose) 2. Daily INR  and CBC  Vincenza Hews, PharmD, BCPS 10/05/2016, 6:35 AM Pager: [phone]

## 2016-10-05 NOTE — ED Notes (Signed)
Pt states he is unable to walk.

## 2016-10-05 NOTE — Progress Notes (Signed)
PROGRESS NOTE    Michael [name]  ESP:[id number] DOB: 07/08/1961 DOA: 10/05/2016 PCP: [name], MD   Brief Narrative: 55 y.o. male with medical history significant of Gout, CKD-IV, sCHF with EF 40-45%, DM, HTN, CAD, PAF on [place], and cirrhosis of the liver, cardiac, OSA not on CPAP, who presents with bilateral knee pain.  Assessment & Plan:   # Bilateral knee pain: exact etiology not clear. ? Acute gouty flare. Xray showed small knee effusion on left side, right knee xray unremarkable.  -treat with colchicine and prednisone (dose adjusted) -pain medications -f/u HIV, RPR.  -no sign of infection this time  # CKD (chronic kidney disease), stage IV ([place]): Creatinine level stable. Monitor BMP. Avoid nephrotoxins  #  Essential hypertension: pt was hypotensive this morning. He was asymptomatic. Holding lasix and reduced the dose of metoprolol. Monitor BP  # Chronic systolic CHF (congestive heart failure) ([place]): stable. Received a dose of lasix in Er. CXR with possible acute pulmonary congestion.   # Diabetes mellitus with complication (HCC):resume lantus 10 unit, steroid may cause hyperglycemia.   #  Persistent A-fib ([place]): on coumadin, BB dose reduced. Monitor in tele.    # GERD (gastroesophageal reflux disease); on protonix   # Mouth bleeding in ER as per report: stopped now.     DVT prophylaxis: Coumadin, SCD Code Status:full Family Communication:none Disposition Plan:likely dc home in 1-2 days. Consultants:   none  Procedures:none Antimicrobials:none  Subjective: Patient was seen and examined at bedside. Patient c/o knee pain improving with the pain medication. Denied headache, dizziness, fever, chills, nausea, vomiting, chest pain, shortness of breath. Denies urinary symptoms.   Objective: Vitals:   10/05/16 0715 10/05/16 0730 10/05/16 0745 10/05/16 0820  BP: 127/62  101/75 (!) 86/60  Pulse: 78   (!) 117  Resp:   19 18  Temp:    99.2 F (37.3 C)    TempSrc:    Oral  SpO2: 97% 98%  98%  Weight:    72.2 kg (159 lb 2.8 oz)  Height:        Intake/Output Summary (Last 24 hours) at 10/05/16 1431 Last data filed at 10/05/16 0751  Gross per 24 hour  Intake                0 ml  Output              325 ml  Net             -325 ml   Filed Weights   10/05/16 0036 10/05/16 0409 10/05/16 0820  Weight: 77.1 kg (170 lb) 76.2 kg (168 lb) 72.2 kg (159 lb 2.8 oz)    Examination:  General exam: Appears calm and comfortable  Respiratory system: Clear to auscultation. Respiratory effort normal. No wheezing or crackle Cardiovascular system: S1 & S2 heard, RRR.  No pedal edema. Gastrointestinal system: Abdomen is nondistended, soft and nontender. Normal bowel sounds heard. Central nervous system: Alert and oriented. No focal neurological deficits. Extremities: left knee mild swollen, no erythema. Right knee unremarkable. Skin: No lesions or ulcers Psychiatry: Judgement and insight appear normal. Mood & affect appropriate.     Data Reviewed: I have personally reviewed following labs and imaging studies  CBC:  Recent Labs Lab 10/05/16 0147  WBC 7.8  NEUTROABS 5.7  HGB 10.7*  HCT 32.6*  MCV 75.1*  PLT 633   Basic Metabolic Panel:  Recent Labs Lab 10/05/16 0147  NA 135  K 3.9  CL 97*  CO2 27  GLUCOSE 148*  BUN 35*  CREATININE 2.35*  CALCIUM 9.1   GFR: Estimated Creatinine Clearance: 30.5 mL/min (by C-G formula based on SCr of 2.35 mg/dL (H)). Liver Function Tests:  Recent Labs Lab 10/05/16 0147  AST 24  ALT 7*  ALKPHOS 129*  BILITOT 0.7  PROT 8.2*  ALBUMIN 3.1*   No results for input(s): LIPASE, AMYLASE in the last 168 hours.  Recent Labs Lab 10/05/16 0841  AMMONIA 34   Coagulation Profile:  Recent Labs Lab 10/05/16 0147  INR 1.93   Cardiac Enzymes: No results for input(s): CKTOTAL, CKMB, CKMBINDEX, TROPONINI in the last 168 hours. BNP (last 3 results) No results for input(s): PROBNP in the last  8760 hours. HbA1C: No results for input(s): HGBA1C in the last 72 hours. CBG:  Recent Labs Lab 10/05/16 0740 10/05/16 1208  GLUCAP 157* 247*   Lipid Profile: No results for input(s): CHOL, HDL, LDLCALC, TRIG, CHOLHDL, LDLDIRECT in the last 72 hours. Thyroid Function Tests: No results for input(s): TSH, T4TOTAL, FREET4, T3FREE, THYROIDAB in the last 72 hours. Anemia Panel: No results for input(s): VITAMINB12, FOLATE, FERRITIN, TIBC, IRON, RETICCTPCT in the last 72 hours. Sepsis Labs: No results for input(s): PROCALCITON, LATICACIDVEN in the last 168 hours.  No results found for this or any previous visit (from the past 240 hour(s)).       Radiology Studies: Dg Chest 2 View  Result Date: 10/05/2016 CLINICAL DATA:  Active bleeding from the back of the mouth. Initial encounter. EXAM: CHEST  2 VIEW COMPARISON:  Chest radiograph performed 06/01/2016 FINDINGS: The lungs are well-aerated. Vascular congestion is noted. Increased interstitial markings raise concern for mild interstitial edema. Mild left midlung opacity could reflect superimposed pneumonia. Would correlate for associated symptoms. A small left pleural effusion is noted. No pneumothorax is seen. The heart is enlarged. The patient is status post median sternotomy. No acute osseous abnormalities are seen. IMPRESSION: 1. Vascular congestion and cardiomegaly. Increased interstitial markings raise concern for mild pulmonary edema. 2. Mild left midlung opacity could reflect superimposed pneumonia. Would correlate for associated symptoms. Small left pleural effusion noted. Electronically Signed   By: Garald Balding M.D.   On: 10/05/2016 01:53   Dg Knee Complete 4 Views Left  Result Date: 10/05/2016 CLINICAL DATA:  Acute onset of bilateral knee pain and swelling. Initial encounter. EXAM: LEFT KNEE - COMPLETE 4+ VIEW COMPARISON:  None. FINDINGS: There is no evidence of fracture or dislocation. The joint spaces are preserved. No  significant degenerative change is seen; the patellofemoral joint is grossly unremarkable in appearance. A small to moderate knee joint effusion is noted. The visualized soft tissues are otherwise unremarkable in appearance. IMPRESSION: 1. No evidence of fracture or dislocation. 2. Small to moderate knee joint effusion noted. Electronically Signed   By: Garald Balding M.D.   On: 10/05/2016 01:51   Dg Knee Complete 4 Views Right  Result Date: 10/05/2016 CLINICAL DATA:  Acute onset of bilateral knee pain and swelling. Initial encounter. EXAM: RIGHT KNEE - COMPLETE 4+ VIEW COMPARISON:  None. FINDINGS: There is no evidence of fracture or dislocation. The joint spaces are preserved. No significant degenerative change is seen; the patellofemoral joint is grossly unremarkable in appearance. No significant joint effusion is seen. The visualized soft tissues are normal in appearance. IMPRESSION: No evidence of fracture or dislocation. Electronically Signed   By: Garald Balding M.D.   On: 10/05/2016 01:51        Scheduled Meds: . allopurinol  300 mg Oral Daily  . atorvastatin  20 mg Oral Daily  . colchicine  0.6 mg Oral BID  . folic acid  1 mg Oral Daily  . insulin aspart  0-5 Units Subcutaneous QHS  . insulin aspart  0-9 Units Subcutaneous TID WC  . metoprolol  50 mg Oral BID  . pantoprazole  40 mg Oral Daily  . [START ON 10/06/2016] predniSONE  50 mg Oral Q breakfast  . sodium chloride flush  3 mL Intravenous Q12H  . warfarin  2.5 mg Oral ONCE-1800  . Warfarin - Pharmacist Dosing Inpatient   Does not apply q1800   Continuous Infusions:   LOS: 0 days    Roopa Graver Tanna Furry, MD [facility] Pager [phone]  If 7PM-7AM, please contact night-coverage [email or web address] Password [email or web address] 10/05/2016, 2:31 PM

## 2016-10-05 NOTE — Progress Notes (Signed)
Patient arrived on unit from ED.  Telemetry placed per MD order and CMT notified.

## 2016-10-05 NOTE — ED Notes (Signed)
CBG:157 RN notified

## 2016-10-05 NOTE — H&P (Signed)
History and Physical    Delta Kinnett IWL:[id number] DOB: 01/01/1961 DOA: 10/05/2016  Referring MD/NP/PA:   PCP: [name], MD   Patient coming from:  The patient is coming from home.  At baseline, pt is independent for most of ADL.   Chief Complaint: Bilateral knee pain, mouth bleeding  HPI: Michael [name] is a 55 y.o. male with medical history significant of Gout, CKD-IV, sCHF with EF 40-45%, DM, HTN, CAD, PAF on [place], and cirrhosis of the liver, cardiac, OSA not on CPAP, who presents with bilateral knee pain and mouth bleeding  Patient states that he has a history of gout and currently taking allopurinol. He has been having bilateral knee pain in the past 4 days which he thinks due to gout. The pain is constant, 8 out of 10 in severity, nonradiating. He has some mild left knee joint swelling. No fever or chills. No joint injury. He says he is unable to walk due to pain. Pt says he noted mouth bleeding for unknown reason today. Denies any injury. Patient denies shortness breath, chest pain, nausea, vomiting, diarrhea, abdominal pain. No symptoms of UTI or unilateral weakness. He noted petechial rashes is in both lower legs since yesterday. His mouth bleeding was stopped after treated with Tranexamic acid by EDP  ED Course: pt was found to have an INR 1.93, BNP 237, stable hemoglobin 10.7 which was 10.0 on 06/02/16, stable renal function, uric acid 4.6, temperature normal, tachycardia, oxygen saturation 97% on room air. Patient received 1 dose of Lasix 40 mg 1 in ED for concerning of CHF exacerbation which I don't think so. X-ray of the right knee is negative, and X-ray of  left knee showed small to moderate amount of effusion. Pt is admitted to telemetry bed as inpatient.  Review of Systems:   General: no fevers, chills, no changes in body weight, has fatigue HEENT: no blurry vision, hearing changes or sore throat. Has mouth bleeding. Respiratory: no dyspnea, coughing,  wheezing CV: no chest pain, no palpitations GI: no nausea, vomiting, abdominal pain, diarrhea, constipation GU: no dysuria, burning on urination, increased urinary frequency, hematuria  Ext: no leg edema Neuro: no unilateral weakness, numbness, or tingling, no vision change or hearing loss Skin: has petechial rashes. MSK: has bilateral knee pain. Heme: No easy bruising.  Travel history: No recent long distant travel.  Allergy: No Known Allergies  Past Medical History:  Diagnosis Date  . Anemia of chronic disease   . Asthma   . Chronic kidney disease (CKD), stage IV (severe) ([place])   . Chronic systolic CHF (congestive heart failure) (HCC)    a. prior EF normal; b. Echo 10/16: Mild LVH, EF 30-35%, mitral valve bioprosthesis present without evidence of stenosis or regurgitation, severe LAE, mild RVE with mild to moderately reduced RVSF, moderate RAE  . Coronary artery disease   . Diabetes mellitus type 2 in obese ([place])   . Diabetes mellitus without complication ([place])   . GERD (gastroesophageal reflux disease)   . H/O tricuspid valve repair 2012  . History of echocardiogram    Echo at [facility] in [facility] 4/12: mod LVH, EF 60-65%, mod LAE, tissue MVR ok, mod TR, mod pulmo HTN with RVSP 48 mmHg  . Hypertension   . Obstructive sleep apnea   . Paroxysmal atrial fibrillation (HCC)    s/p Maze procedure at time of MVR in 2011  . Pulmonary HTN   . S/P mitral valve replacement with porcine valve 2012    Past Surgical  History:  Procedure Laterality Date  . CARDIAC SURGERY      Social History:  reports that he has quit smoking. He has quit using smokeless tobacco. He reports that he does not drink alcohol or use drugs.  Family History:  Family History  Problem Relation Age of Onset  . Heart attack Neg Hx      Prior to Admission medications   Medication Sig Start Date End Date Taking? Authorizing Provider  acetaminophen (TYLENOL) 325 MG tablet Take 650 mg by mouth every 6 (six) hours  as needed for headache (pain). Reported on 01/10/2016    Historical Provider, MD  allopurinol (ZYLOPRIM) 300 MG tablet Take 1 tablet (300 mg total) by mouth daily. 09/16/16   [name], MD  atorvastatin (LIPITOR) 20 MG tablet Take 1 tablet (20 mg total) by mouth daily. 07/01/16   [name], MD  Blood Glucose Monitoring Suppl (ACCU-CHEK AVIVA) device Use as Instructed 3 times daily 01/24/16   [name], MD  colchicine 0.6 MG tablet Take 2 tablets (1.2 mg) at the onset of a gout flare, may repeat 1 tablet (0.6 mg) in one hour if symptoms persist. 08/08/16   [name], MD  diclofenac sodium (VOLTAREN) 1 % GEL Apply 4 g topically 4 (four) times daily. 06/02/16   Milagros Loll, MD  digoxin (LANOXIN) 0.125 MG tablet Take 1 tablet (0.125 mg total) by mouth daily. 04/01/16   [name], MD  docusate sodium (COLACE) 100 MG capsule Take 100 mg by mouth 2 (two) times daily.    Historical Provider, MD  feeding supplement, ENSURE ENLIVE, (ENSURE ENLIVE) LIQD Take 237 mLs by mouth 2 (two) times daily between meals. Patient not taking: Reported on 09/16/2016 06/02/16   Juliet Rude, MD  ferrous sulfate 325 (65 FE) MG tablet Take 1 tablet (325 mg total) by mouth daily with breakfast. Patient not taking: Reported on 09/16/2016 06/02/16   Milagros Loll, MD  furosemide (LASIX) 80 MG tablet Take 1 tablet (80 mg total) by mouth 2 (two) times daily. 06/17/16   [name], MD  glucose blood (ACCU-CHEK AVIVA) test strip Use 3 times daily as directed. 02/04/16   [name], MD  HYDROcodone-acetaminophen (NORCO/VICODIN) 5-325 MG tablet Take 1 tablet by mouth every 4 (four) hours as needed for severe pain. Patient not taking: Reported on 09/16/2016 06/02/16   Milagros Loll, MD  Insulin Detemir (LEVEMIR FLEXTOUCH) 100 UNIT/ML Pen Inject 10 Units into the skin daily at 10 pm. 06/17/16   [name], MD  Insulin Pen Needle 31G X 5 MM MISC 1 each by Does not apply route at bedtime. 06/17/16   [name], MD    isosorbide mononitrate (IMDUR) 30 MG 24 hr tablet Take 0.5 tablets (15 mg total) by mouth daily. 10/24/15   Liliane Shi, PA-C  Lancet Devices [facility]) lancets Use as instructed 3 times daily 01/24/16   [name], MD  metoprolol (LOPRESSOR) 100 MG tablet Take 1 tablet (100 mg total) by mouth 2 (two) times daily. 06/17/16   [name], MD  omeprazole (PRILOSEC) 20 MG capsule Take 1 capsule (20 mg total) by mouth daily. 06/17/16   [name], MD  potassium chloride SA (K-DUR,KLOR-CON) 20 MEQ tablet Take 1 tablet (20 mEq total) by mouth daily. 10/24/15   Liliane Shi, PA-C  predniSONE (DELTASONE) 20 MG tablet Take 1 tablet (20 mg total) by mouth daily with breakfast. 09/16/16   [name], MD  traMADol (ULTRAM) 50 MG  tablet Take 1 tablet (50 mg total) by mouth every 12 (twelve) hours as needed. 08/08/16   [name], MD  traZODone (DESYREL) 50 MG tablet Take 1 tablet (50 mg total) by mouth at bedtime as needed for sleep. 06/10/16   [name], MD  warfarin (COUMADIN) 2.5 MG tablet Take 1-2 tablets by mouth daily as directed by coumadin clinic 08/08/16   Fay Records, MD    Physical Exam: Vitals:   10/05/16 0215 10/05/16 0409 10/05/16 0415 10/05/16 0545  BP: 115/80  118/79 125/59  Pulse: 88  113 95  Resp: 18  18 18   Temp:      TempSrc:      SpO2: 100%  97% 96%  Weight:  76.2 kg (168 lb)    Height:  5\' 5"  (1.651 m)     General: Not in acute distress HEENT:       Eyes: PERRL, EOMI, no scleral icterus.       ENT: No discharge from the ears and nose, no pharynx injection, no tonsillar enlargement. Has left anterior tongue bleeding due to 0.25 cm laceration. There is also bleeding to the left lower gingiva per EDP-->stopped when I saw pt in ED.        Neck: No JVD, no bruit, no mass felt. Heme: No neck lymph node enlargement. Cardiac: S1/S2, RRR, No murmurs, No gallops or rubs. Respiratory: No rales, wheezing, rhonchi or rubs. GI: Soft, nondistended, nontender, no rebound  pain, no organomegaly, BS present. GU: No hematuria Ext: No pitting leg edema bilaterally. 2+DP/PT pulse bilaterally. Musculoskeletal: has small left knee effusions; reduced ROM secondary to pain bilaterally; no erythema or warmth. Skin: has petechial rashes in legs Neuro: Alert, oriented X3, cranial nerves II-XII grossly intact, moves all extremities. Psych: Patient is not psychotic, no suicidal or hemocidal ideation.  Labs on Admission: I have personally reviewed following labs and imaging studies  CBC:  Recent Labs Lab 10/05/16 0147  WBC 7.8  NEUTROABS 5.7  HGB 10.7*  HCT 32.6*  MCV 75.1*  PLT 175   Basic Metabolic Panel:  Recent Labs Lab 10/05/16 0147  NA 135  K 3.9  CL 97*  CO2 27  GLUCOSE 148*  BUN 35*  CREATININE 2.35*  CALCIUM 9.1   GFR: Estimated Creatinine Clearance: 33.5 mL/min (by C-G formula based on SCr of 2.35 mg/dL (H)). Liver Function Tests:  Recent Labs Lab 10/05/16 0147  AST 24  ALT 7*  ALKPHOS 129*  BILITOT 0.7  PROT 8.2*  ALBUMIN 3.1*   No results for input(s): LIPASE, AMYLASE in the last 168 hours. No results for input(s): AMMONIA in the last 168 hours. Coagulation Profile:  Recent Labs Lab 10/05/16 0147  INR 1.93   Cardiac Enzymes: No results for input(s): CKTOTAL, CKMB, CKMBINDEX, TROPONINI in the last 168 hours. BNP (last 3 results) No results for input(s): PROBNP in the last 8760 hours. HbA1C: No results for input(s): HGBA1C in the last 72 hours. CBG: No results for input(s): GLUCAP in the last 168 hours. Lipid Profile: No results for input(s): CHOL, HDL, LDLCALC, TRIG, CHOLHDL, LDLDIRECT in the last 72 hours. Thyroid Function Tests: No results for input(s): TSH, T4TOTAL, FREET4, T3FREE, THYROIDAB in the last 72 hours. Anemia Panel: No results for input(s): VITAMINB12, FOLATE, FERRITIN, TIBC, IRON, RETICCTPCT in the last 72 hours. Urine analysis:    Component Value Date/Time   COLORURINE YELLOW 02/25/2016 1026    APPEARANCEUR CLEAR 02/25/2016 1026   LABSPEC 1.009 02/25/2016 1026   PHURINE  7.5 02/25/2016 1026   GLUCOSEU NEGATIVE 02/25/2016 1026   HGBUR MODERATE (A) 02/25/2016 1026   BILIRUBINUR NEGATIVE 02/25/2016 1026   KETONESUR NEGATIVE 02/25/2016 1026   PROTEINUR 100 (A) 02/25/2016 1026   NITRITE NEGATIVE 02/25/2016 1026   LEUKOCYTESUR NEGATIVE 02/25/2016 1026   Sepsis Labs: @LABRCNTIP (procalcitonin:4,lacticidven:4) )No results found for this or any previous visit (from the past 240 hour(s)).   Radiological Exams on Admission: Dg Chest 2 View  Result Date: 10/05/2016 CLINICAL DATA:  Active bleeding from the back of the mouth. Initial encounter. EXAM: CHEST  2 VIEW COMPARISON:  Chest radiograph performed 06/01/2016 FINDINGS: The lungs are well-aerated. Vascular congestion is noted. Increased interstitial markings raise concern for mild interstitial edema. Mild left midlung opacity could reflect superimposed pneumonia. Would correlate for associated symptoms. A small left pleural effusion is noted. No pneumothorax is seen. The heart is enlarged. The patient is status post median sternotomy. No acute osseous abnormalities are seen. IMPRESSION: 1. Vascular congestion and cardiomegaly. Increased interstitial markings raise concern for mild pulmonary edema. 2. Mild left midlung opacity could reflect superimposed pneumonia. Would correlate for associated symptoms. Small left pleural effusion noted. Electronically Signed   By: Garald Balding M.D.   On: 10/05/2016 01:53   Dg Knee Complete 4 Views Left  Result Date: 10/05/2016 CLINICAL DATA:  Acute onset of bilateral knee pain and swelling. Initial encounter. EXAM: LEFT KNEE - COMPLETE 4+ VIEW COMPARISON:  None. FINDINGS: There is no evidence of fracture or dislocation. The joint spaces are preserved. No significant degenerative change is seen; the patellofemoral joint is grossly unremarkable in appearance. A small to moderate knee joint effusion is noted.  The visualized soft tissues are otherwise unremarkable in appearance. IMPRESSION: 1. No evidence of fracture or dislocation. 2. Small to moderate knee joint effusion noted. Electronically Signed   By: Garald Balding M.D.   On: 10/05/2016 01:51   Dg Knee Complete 4 Views Right  Result Date: 10/05/2016 CLINICAL DATA:  Acute onset of bilateral knee pain and swelling. Initial encounter. EXAM: RIGHT KNEE - COMPLETE 4+ VIEW COMPARISON:  None. FINDINGS: There is no evidence of fracture or dislocation. The joint spaces are preserved. No significant degenerative change is seen; the patellofemoral joint is grossly unremarkable in appearance. No significant joint effusion is seen. The visualized soft tissues are normal in appearance. IMPRESSION: No evidence of fracture or dislocation. Electronically Signed   By: Garald Balding M.D.   On: 10/05/2016 01:51     EKG: Independently reviewed. Atrial fibrillation, QTC 511, LAD.   Assessment/Plan Principal Problem:   Bilateral knee pain Active Problems:   CKD (chronic kidney disease), stage IV (HCC)   Essential hypertension   Chronic systolic CHF (congestive heart failure) (HCC)   Gout   Diabetes mellitus with complication (HCC)   A-fib (HCC)   GERD (gastroesophageal reflux disease)   Liver cirrhosis (HCC)   HLD (hyperlipidemia)   Mouth bleeding   Petechial rash   Bilateral knee pain: Etiology is not clear. Most likely due to gout to flare up. Given bilateral nature, and no fever or leukocytosis, less likely to have septic joints.  -will admit to tele bed as inpt (this patient has multiple chronic comorbidities as listed in HPI. Patient requires inpatient status due to high risk for further deterioration and high frequency of surveillance required) -continue home allopurinol -Start colchicine 0.6 mg twice a day -Start prednisone 20 mg daily -prn percoct -pt/ot -check RPR given her petechial rashes.  Chronic systolic CHF (congestive heart  failure): BNP 237, no leg edema or JVD, clinically no CHF exacerbation. -will continue home Lasix 80 mg twice a day (patient received 1 dose of IV Lasix, 40 mg 1) -Continue metoprolol  CKD (chronic kidney disease), stage IV ([place]): Stable. Baseline creatinine 1.9-2.9. His creatinine is 2.35, BUN 35. -Follow-up renal function by BMP  Essential hypertension: Blood pressure 118/79 -Continue metoprolol and Lasix  DM-II: Last A1c 6.2 on 06/17/16, well controled. Patient used to take levemir, but he is not taking this med now -SSI  Atrial Fibrillation: CHA2DS2-VASc Score is 4, needs oral anticoagulation. Patient is on Coumadin at home. INR is  ~110 on admission. Pt used to be on digitoxin, come but currently is not taking this medication. -Continue metoprolol -Continue Coumadin per pharmacy  GERD: -Protonix  Liver cirrhosis ([place]): Mental status is normal. No signs of hepatic encephalopathy -Check ammonia level  HLD: Last LDL was 69 on 412/17 -Continue home medications: Lipitor  Mouth bleeding: stopped. Hgb stable -observe closely  Petechial rash: Etiology is not clear. May be related to Coumadin use -Check RPR   DVT ppx: on Coumadin Code Status: Full code Family Communication: None at bed side. Disposition Plan:  Anticipate discharge back to previous home environment Consults called:  none Admission status:  Inpatient/tele    Date of Service 10/05/2016    Ivor Costa [facility] Pager [phone]  If 7PM-7AM, please contact night-coverage [email or web address] Password [facility] 10/05/2016, 6:46 AM

## 2016-10-06 DIAGNOSIS — I5022 Chronic systolic (congestive) heart failure: Secondary | ICD-10-CM

## 2016-10-06 DIAGNOSIS — I481 Persistent atrial fibrillation: Secondary | ICD-10-CM

## 2016-10-06 DIAGNOSIS — I480 Paroxysmal atrial fibrillation: Secondary | ICD-10-CM

## 2016-10-06 DIAGNOSIS — M10042 Idiopathic gout, left hand: Secondary | ICD-10-CM

## 2016-10-06 LAB — BASIC METABOLIC PANEL
ANION GAP: 9 (ref 5–15)
BUN: 40 mg/dL — ABNORMAL HIGH (ref 6–20)
CALCIUM: 8.9 mg/dL (ref 8.9–10.3)
CHLORIDE: 99 mmol/L — AB (ref 101–111)
CO2: 27 mmol/L (ref 22–32)
CREATININE: 2.36 mg/dL — AB (ref 0.61–1.24)
GFR calc non Af Amer: 29 mL/min — ABNORMAL LOW (ref >60)
GFR, EST AFRICAN AMERICAN: 34 mL/min — AB (ref >60)
Glucose, Bld: 99 mg/dL (ref 65–99)
Potassium: 3.9 mmol/L (ref 3.5–5.1)
SODIUM: 135 mmol/L (ref 135–145)

## 2016-10-06 LAB — CBC
HCT: 28 % — ABNORMAL LOW (ref 39.0–52.0)
HEMOGLOBIN: 9.2 g/dL — AB (ref 13.0–17.0)
MCH: 24.5 pg — ABNORMAL LOW (ref 26.0–34.0)
MCHC: 32.9 g/dL (ref 30.0–36.0)
MCV: 74.5 fL — ABNORMAL LOW (ref 78.0–100.0)
PLATELETS: 288 10*3/uL (ref 150–400)
RBC: 3.76 MIL/uL — AB (ref 4.22–5.81)
RDW: 14.8 % (ref 11.5–15.5)
WBC: 8.7 10*3/uL (ref 4.0–10.5)

## 2016-10-06 LAB — GLUCOSE, CAPILLARY
GLUCOSE-CAPILLARY: 100 mg/dL — AB (ref 65–99)
GLUCOSE-CAPILLARY: 303 mg/dL — AB (ref 65–99)
GLUCOSE-CAPILLARY: 294 mg/dL — AB (ref 65–99)
Glucose-Capillary: 180 mg/dL — ABNORMAL HIGH (ref 65–99)

## 2016-10-06 LAB — PROTIME-INR
INR: 2.11
PROTHROMBIN TIME: 24 s — AB (ref 11.4–15.2)

## 2016-10-06 NOTE — Progress Notes (Signed)
ANTICOAGULATION CONSULT NOTE - Follow Up Consult  Pharmacy Consult for Warfarin Indication: atrial fibrillation  No Known Allergies  Patient Measurements: Height: 5\' 5"  (165.1 cm) Weight: 159 lb 2.8 oz (72.2 kg) IBW/kg (Calculated) : 61.5  Vital Signs: Temp: 97.4 F (36.3 C) (11/20 0800) Temp Source: Oral (11/20 0800) BP: 123/67 (11/20 1024) Pulse Rate: 78 (11/20 1024)  Labs:  Recent Labs  10/05/16 0147 10/06/16 0555  HGB 10.7* 9.2*  HCT 32.6* 28.0*  PLT 303 288  LABPROT 22.4* 24.0*  INR 1.93 2.11  CREATININE 2.35* 2.36*    Estimated Creatinine Clearance: 30.4 mL/min (by C-G formula based on SCr of 2.36 mg/dL (H)).   Assessment: 89 YOM who presented on 11/19 with bilateral knee pain and mouth bleeding. The patient was on warfarin PTA for hx Afib with admit INR 1.93. Mouth bleeding stopped with tranexamic acid, and warfarin was resumed.   INR today is therapeutic (INR 2.11 << 1.93, goal of 2-3) however is noted to have some nosebleeds today and Hgb down to 9.2 << 10.7. Per MD Carolin Sicks) - to hold warfarin this evening.   Goal of Therapy:  INR 2-3   Plan:  1. Hold warfarin tonight 2. Will continue to monitor for any signs/symptoms of bleeding and will follow up with PT/INR in the a.m.   Thank you for allowing pharmacy to be a part of this patient's care.  Alycia Rossetti, PharmD, BCPS Clinical Pharmacist Pager: [phone] Clinical phone for 10/06/2016 from 7a-3:30p: [phone] If after 3:30p, please call main pharmacy at: [phone] 10/06/2016 2:32 PM

## 2016-10-06 NOTE — Consult Note (Signed)
Michael [name] is an 55 y.o. male.    Chief Complaint: bilateral knee pain  HPI: 55 y/o male with self reported history of gout c/o bilateral knee pain for the past 4-5 days. Pt admitted yesterday for bilateral knee pain, left greater than right and started on anti gout medications. Currently his knees are feeling slightly better. Denies any fevers chills or illnesses. Denies any falls or injuries. Pt on coumadin currently and possible hemarthrosis causing his effusion to the left knee. Denies any numbness or tingling distally.  PCP:  [name], MD  PMH: Past Medical History:  Diagnosis Date  . Anemia of chronic disease   . Asthma   . Chronic kidney disease (CKD), stage IV (severe) ([place])   . Chronic systolic CHF (congestive heart failure) (HCC)    a. prior EF normal; b. Echo 10/16: Mild LVH, EF 30-35%, mitral valve bioprosthesis present without evidence of stenosis or regurgitation, severe LAE, mild RVE with mild to moderately reduced RVSF, moderate RAE  . Coronary artery disease   . Diabetes mellitus type 2 in obese ([place])   . Diabetes mellitus without complication ([place])   . GERD (gastroesophageal reflux disease)   . H/O tricuspid valve repair 2012  . History of echocardiogram    Echo at [facility] in [address] 4/12: mod LVH, EF 60-65%, mod LAE, tissue MVR ok, mod TR, mod pulmo HTN with RVSP 48 mmHg  . Hypertension   . Obstructive sleep apnea   . Paroxysmal atrial fibrillation (HCC)    s/p Maze procedure at time of MVR in 2011  . Pulmonary HTN   . S/P mitral valve replacement with porcine valve 2012    PSH: Past Surgical History:  Procedure Laterality Date  . CARDIAC SURGERY      Social History:  reports that he has quit smoking. He has quit using smokeless tobacco. He reports that he does not drink alcohol or use drugs.  Allergies:  No Known Allergies  Medications: Current Facility-Administered Medications  Medication Dose Route Frequency Provider Last Rate Last Dose    . allopurinol (ZYLOPRIM) tablet 300 mg  300 mg Oral Daily Ivor Costa, MD   300 mg at 10/06/16 1024  . atorvastatin (LIPITOR) tablet 20 mg  20 mg Oral Daily Ivor Costa, MD   20 mg at 10/06/16 1024  . colchicine tablet 0.6 mg  0.6 mg Oral BID Ivor Costa, MD   0.6 mg at 10/06/16 1024  . folic acid (FOLVITE) tablet 1 mg  1 mg Oral Daily Ivor Costa, MD   1 mg at 10/06/16 1026  . HYDROcodone-acetaminophen (NORCO/VICODIN) 5-325 MG per tablet 1 tablet  1 tablet Oral Q4H PRN Ivor Costa, MD   1 tablet at 10/06/16 1720  . insulin aspart (novoLOG) injection 0-5 Units  0-5 Units Subcutaneous QHS Ivor Costa, MD      . insulin aspart (novoLOG) injection 0-9 Units  0-9 Units Subcutaneous TID WC Ivor Costa, MD   5 Units at 10/06/16 1719  . insulin glargine (LANTUS) injection 5 Units  5 Units Subcutaneous QHS Dron Tanna Furry, MD   5 Units at 10/05/16 2114  . metoprolol (LOPRESSOR) injection 5 mg  5 mg Intravenous Q6H PRN Dron Tanna Furry, MD      . metoprolol (LOPRESSOR) tablet 50 mg  50 mg Oral BID Dron Tanna Furry, MD   50 mg at 10/06/16 1024  . pantoprazole (PROTONIX) EC tablet 40 mg  40 mg Oral Daily Ivor Costa, MD   40 mg at  10/06/16 1025  . predniSONE (DELTASONE) tablet 50 mg  50 mg Oral Q breakfast Dron Tanna Furry, MD   50 mg at 10/06/16 0755  . sodium chloride flush (NS) 0.9 % injection 3 mL  3 mL Intravenous Q12H Ivor Costa, MD   3 mL at 10/06/16 1032  . Warfarin - Pharmacist Dosing Inpatient   Does not apply q1800 Dron Tanna Furry, MD   Stopped at 10/06/16 1800  . zolpidem (AMBIEN) tablet 5 mg  5 mg Oral QHS PRN Ivor Costa, MD        Results for orders placed or performed during the hospital encounter of 10/05/16 (from the past 48 hour(s))  CBC with Differential/Platelet     Status: Abnormal   Collection Time: 10/05/16  1:47 AM  Result Value Ref Range   WBC 7.8 4.0 - 10.5 K/uL   RBC 4.34 4.22 - 5.81 MIL/uL   Hemoglobin 10.7 (L) 13.0 - 17.0 g/dL   HCT 32.6 (L) 39.0 - 52.0 %   MCV 75.1  (L) 78.0 - 100.0 fL   MCH 24.7 (L) 26.0 - 34.0 pg   MCHC 32.8 30.0 - 36.0 g/dL   RDW 14.8 11.5 - 15.5 %   Platelets 303 150 - 400 K/uL   Neutrophils Relative % 72 %   Neutro Abs 5.7 1.7 - 7.7 K/uL   Lymphocytes Relative 13 %   Lymphs Abs 1.0 0.7 - 4.0 K/uL   Monocytes Relative 12 %   Monocytes Absolute 1.0 0.1 - 1.0 K/uL   Eosinophils Relative 3 %   Eosinophils Absolute 0.2 0.0 - 0.7 K/uL   Basophils Relative 0 %   Basophils Absolute 0.0 0.0 - 0.1 K/uL  Comprehensive metabolic panel     Status: Abnormal   Collection Time: 10/05/16  1:47 AM  Result Value Ref Range   Sodium 135 135 - 145 mmol/L   Potassium 3.9 3.5 - 5.1 mmol/L   Chloride 97 (L) 101 - 111 mmol/L   CO2 27 22 - 32 mmol/L   Glucose, Bld 148 (H) 65 - 99 mg/dL   BUN 35 (H) 6 - 20 mg/dL   Creatinine, Ser 2.35 (H) 0.61 - 1.24 mg/dL   Calcium 9.1 8.9 - 10.3 mg/dL   Total Protein 8.2 (H) 6.5 - 8.1 g/dL   Albumin 3.1 (L) 3.5 - 5.0 g/dL   AST 24 15 - 41 U/L   ALT 7 (L) 17 - 63 U/L   Alkaline Phosphatase 129 (H) 38 - 126 U/L   Total Bilirubin 0.7 0.3 - 1.2 mg/dL   GFR calc non Af Amer 29 (L) >60 mL/min   GFR calc Af Amer 34 (L) >60 mL/min    Comment: (NOTE) The eGFR has been calculated using the CKD EPI equation. This calculation has not been validated in all clinical situations. eGFR's persistently <60 mL/min signify possible Chronic Kidney Disease.    Anion gap 11 5 - 15  Protime-INR     Status: Abnormal   Collection Time: 10/05/16  1:47 AM  Result Value Ref Range   Prothrombin Time 22.4 (H) 11.4 - 15.2 seconds   INR 1.93   Type and screen [place]     Status: None   Collection Time: 10/05/16  1:47 AM  Result Value Ref Range   ABO/RH(D) B POS    Antibody Screen NEG    Sample Expiration 10/08/2016   Brain natriuretic peptide     Status: Abnormal   Collection Time: 10/05/16  1:47 AM  Result Value Ref Range   B Natriuretic Peptide 237.4 (H) 0.0 - 100.0 pg/mL  Uric acid     Status: None    Collection Time: 10/05/16  1:47 AM  Result Value Ref Range   Uric Acid, Serum 4.6 4.4 - 7.6 mg/dL  ABO/Rh     Status: None   Collection Time: 10/05/16  1:47 AM  Result Value Ref Range   ABO/RH(D) B POS   RPR     Status: None   Collection Time: 10/05/16  6:45 AM  Result Value Ref Range   RPR Ser Ql Non Reactive Non Reactive    Comment: (NOTE) Performed At: [facility] [address] [place], [place] [id number] Lindon Romp MD [id number]   HIV antibody (routine testing) (NOT for [facility])     Status: None   Collection Time: 10/05/16  6:45 AM  Result Value Ref Range   HIV Screen 4th Generation wRfx Non Reactive Non Reactive    Comment: (NOTE) Performed At: [facility] [place], [place] [id number] Lindon Romp MD [id number]   CBG monitoring, ED     Status: Abnormal   Collection Time: 10/05/16  7:40 AM  Result Value Ref Range   Glucose-Capillary 157 (H) 65 - 99 mg/dL  Ammonia     Status: None   Collection Time: 10/05/16  8:41 AM  Result Value Ref Range   Ammonia 34 9 - 35 umol/L  Glucose, capillary     Status: Abnormal   Collection Time: 10/05/16 12:08 PM  Result Value Ref Range   Glucose-Capillary 247 (H) 65 - 99 mg/dL  Glucose, capillary     Status: Abnormal   Collection Time: 10/05/16  4:26 PM  Result Value Ref Range   Glucose-Capillary 312 (H) 65 - 99 mg/dL  Glucose, capillary     Status: Abnormal   Collection Time: 10/05/16  8:09 PM  Result Value Ref Range   Glucose-Capillary 190 (H) 65 - 99 mg/dL  Basic metabolic panel     Status: Abnormal   Collection Time: 10/06/16  5:55 AM  Result Value Ref Range   Sodium 135 135 - 145 mmol/L   Potassium 3.9 3.5 - 5.1 mmol/L   Chloride 99 (L) 101 - 111 mmol/L   CO2 27 22 - 32 mmol/L   Glucose, Bld 99 65 - 99 mg/dL   BUN 40 (H) 6 - 20 mg/dL   Creatinine, Ser 2.36 (H) 0.61 - 1.24 mg/dL   Calcium 8.9 8.9 - 10.3 mg/dL   GFR calc non Af Amer 29 (L) >60 mL/min   GFR calc Af Amer 34 (L)  >60 mL/min    Comment: (NOTE) The eGFR has been calculated using the CKD EPI equation. This calculation has not been validated in all clinical situations. eGFR's persistently <60 mL/min signify possible Chronic Kidney Disease.    Anion gap 9 5 - 15  CBC     Status: Abnormal   Collection Time: 10/06/16  5:55 AM  Result Value Ref Range   WBC 8.7 4.0 - 10.5 K/uL   RBC 3.76 (L) 4.22 - 5.81 MIL/uL   Hemoglobin 9.2 (L) 13.0 - 17.0 g/dL   HCT 28.0 (L) 39.0 - 52.0 %   MCV 74.5 (L) 78.0 - 100.0 fL   MCH 24.5 (L) 26.0 - 34.0 pg   MCHC 32.9 30.0 - 36.0 g/dL   RDW 14.8 11.5 - 15.5 %   Platelets 288 150 - 400 K/uL  Protime-INR  Status: Abnormal   Collection Time: 10/06/16  5:55 AM  Result Value Ref Range   Prothrombin Time 24.0 (H) 11.4 - 15.2 seconds   INR 2.11   Glucose, capillary     Status: Abnormal   Collection Time: 10/06/16  8:00 AM  Result Value Ref Range   Glucose-Capillary 100 (H) 65 - 99 mg/dL  Glucose, capillary     Status: Abnormal   Collection Time: 10/06/16 11:38 AM  Result Value Ref Range   Glucose-Capillary 303 (H) 65 - 99 mg/dL  Glucose, capillary     Status: Abnormal   Collection Time: 10/06/16  5:04 PM  Result Value Ref Range   Glucose-Capillary 294 (H) 65 - 99 mg/dL   Dg Chest 2 View  Result Date: 10/05/2016 CLINICAL DATA:  Active bleeding from the back of the mouth. Initial encounter. EXAM: CHEST  2 VIEW COMPARISON:  Chest radiograph performed 06/01/2016 FINDINGS: The lungs are well-aerated. Vascular congestion is noted. Increased interstitial markings raise concern for mild interstitial edema. Mild left midlung opacity could reflect superimposed pneumonia. Would correlate for associated symptoms. A small left pleural effusion is noted. No pneumothorax is seen. The heart is enlarged. The patient is status post median sternotomy. No acute osseous abnormalities are seen. IMPRESSION: 1. Vascular congestion and cardiomegaly. Increased interstitial markings raise  concern for mild pulmonary edema. 2. Mild left midlung opacity could reflect superimposed pneumonia. Would correlate for associated symptoms. Small left pleural effusion noted. Electronically Signed   By: Garald Balding M.D.   On: 10/05/2016 01:53   Dg Knee Complete 4 Views Left  Result Date: 10/05/2016 CLINICAL DATA:  Acute onset of bilateral knee pain and swelling. Initial encounter. EXAM: LEFT KNEE - COMPLETE 4+ VIEW COMPARISON:  None. FINDINGS: There is no evidence of fracture or dislocation. The joint spaces are preserved. No significant degenerative change is seen; the patellofemoral joint is grossly unremarkable in appearance. A small to moderate knee joint effusion is noted. The visualized soft tissues are otherwise unremarkable in appearance. IMPRESSION: 1. No evidence of fracture or dislocation. 2. Small to moderate knee joint effusion noted. Electronically Signed   By: Garald Balding M.D.   On: 10/05/2016 01:51   Dg Knee Complete 4 Views Right  Result Date: 10/05/2016 CLINICAL DATA:  Acute onset of bilateral knee pain and swelling. Initial encounter. EXAM: RIGHT KNEE - COMPLETE 4+ VIEW COMPARISON:  None. FINDINGS: There is no evidence of fracture or dislocation. The joint spaces are preserved. No significant degenerative change is seen; the patellofemoral joint is grossly unremarkable in appearance. No significant joint effusion is seen. The visualized soft tissues are normal in appearance. IMPRESSION: No evidence of fracture or dislocation. Electronically Signed   By: Garald Balding M.D.   On: 10/05/2016 01:51    ROS: ROS Pain with rom and weight bearing left greater than right knee No fevers or chills  Physical Exam: Alert and appropriate 55 y/o male in no acute distress Pelvis stable Right lower extremity: full rom, no tenderness, no effusion nv intact distally No deformity Left lower extremity: mild effusion left knee, nv intact distally Mild pain with rom/flexion of left  knee nv intact distally  Physical Exam   Assessment/Plan Assessment: bilateral knee pain, suspected gouty flare  Plan: Discussed conservative treatment with continued oral medications versus adding cortisone injections for pain relief. Pt would like the injections today. Recommend monitoring his blood sugars over the next 3-4 days but the injections should help with his pain.  Will monitor his  progress thru his hospital stay  Procedure: after sterile prep and consent right knee injected with 3/3/1 marcaine/lidocaine/kenalog  Left knee had 10ccs of normal aspirate and injected with 3/3/1 of marcaine/lidocaine/kenalog Recommend icing q shift No signs of infection or hemarthrosis

## 2016-10-06 NOTE — Evaluation (Signed)
Occupational Therapy Evaluation and Discharge Summary Patient Details Name: Michael [name] MRN: [id number] DOB: 08-24-1961 Today's Date: 10/06/2016    History of Present Illness Pt is a 55 yo male admitted with B knee pain.  Xrays are negative and no infection.  Pt with history of gout., CKD, CHF, EF 45%, DM, HTN, CAD, PAF, cirrhosis of liver.   Clinical Impression   Pt admitted with the above diagnosis and overall is mod I to I with all his adls. Pt uses a walker at home when gout flares up and appears to be at or close to baseline despite his obvious knee pain. Pt does state the pain is better today than yesterday.  Pt is not in need of acute OT at this time. Pt has all needed equipment at home and daughter can assist as needed.      Follow Up Recommendations  No OT follow up;Supervision - Intermittent    Equipment Recommendations  None recommended by OT    Recommendations for Other Services       Precautions / Restrictions Precautions Precautions: None Restrictions Weight Bearing Restrictions: No      Mobility Bed Mobility Overal bed mobility: Modified Independent             General bed mobility comments: HOB at 30 degrees  Transfers Overall transfer level: Modified independent Equipment used: Rolling walker (2 wheeled)             General transfer comment: Pt given extra time due to pain but did not need assist.    Balance Overall balance assessment: No apparent balance deficits (not formally assessed)                                          ADL Overall ADL's : At baseline                                       General ADL Comments: Pt able to complete all basic adls without physical assist. Pt uses a walker to ambulate which is his baseline at home. pt does not always uses walker at home but does during gout flare ups.  Pt has not taken recent falls and was safe walking with walker.      Vision Vision  Assessment?: No apparent visual deficits   Perception     Praxis      Pertinent Vitals/Pain Pain Assessment: Faces Faces Pain Scale: Hurts little more Pain Location: knees Pain Descriptors / Indicators: Aching Pain Intervention(s): Limited activity within patient's tolerance;Monitored during session;Repositioned     Hand Dominance Right   Extremity/Trunk Assessment Upper Extremity Assessment Upper Extremity Assessment: Overall WFL for tasks assessed   Lower Extremity Assessment Lower Extremity Assessment: Defer to PT evaluation   Cervical / Trunk Assessment Cervical / Trunk Assessment: Normal   Communication Communication Communication: No difficulties   Cognition Arousal/Alertness: Awake/alert Behavior During Therapy: WFL for tasks assessed/performed Overall Cognitive Status: Within Functional Limits for tasks assessed                     General Comments       Exercises       Shoulder Instructions      Home Living Family/patient expects to be discharged to:: Private residence Living Arrangements: Alone Available Help  at Discharge: Family;Available PRN/intermittently Type of Home: Apartment Home Access: Stairs to enter Entrance Stairs-Number of Steps: 2 Entrance Stairs-Rails: None Home Layout: One level     Bathroom Shower/Tub: Occupational psychologist: Standard     Home Equipment: Environmental consultant - 2 wheels   Additional Comments: Son helps but works during the day.  Daughter lives in town as well.      Prior Functioning/Environment Level of Independence: Independent with assistive device(s)        Comments: On disability following heart surgery        OT Problem List:     OT Treatment/Interventions:      OT Goals(Current goals can be found in the care plan section) Acute Rehab OT Goals Patient Stated Goal: to go home OT Goal Formulation: All assessment and education complete, DC therapy  OT Frequency:     Barriers to D/C:             Co-evaluation              End of Session Equipment Utilized During Treatment: Rolling walker Nurse Communication: Mobility status  Activity Tolerance: Patient tolerated treatment well Patient left: in bed;with call bell/phone within reach   Time: 1215-1235 OT Time Calculation (min): 20 min Charges:  OT General Charges $OT Visit: 1 Procedure OT Evaluation $OT Eval Moderate Complexity: 1 Procedure G-Codes:    Glenford Peers 01-Nov-2016, 12:38 PM  [phone]

## 2016-10-06 NOTE — Progress Notes (Signed)
Patient is experiencing nose bleeds.  MD notified.

## 2016-10-06 NOTE — Hospital Discharge Follow-Up (Signed)
Transitional Care Clinic Care Coordination Note:  Admit date:  10/05/16 Discharge date: TBD Discharge Disposition: home Patient contact: # [phone]. He stated that he does not think that he will need a [name] interpreter when he is contacted by phone. He noted that he does not need an interpreter when he comes to the clinic Emergency contact(s): no other #s provided.   This Case Manager reviewed patient's EMR and determined patient would benefit from post-discharge medical management and chronic care management services through the [facility]. Patient has a history of gout, CKD - 4, CHF, DM, HTN, CAD, PAF on coumadin, cirrhosis of the liver, OSA.  He was admitted with a complaint of bilateral knee pain and bleeding in his mouth. He has 7 hospital admissions in the last year. . This Case Manager met with patient to discuss the services and medical management that can be provided at the [facility]. Patient verbalized understanding and agreed to receive post-discharge care at the [facility]. He is known to the [facility].   Patient scheduled for Transitional Care appointment on 10/16/16 @ 0945 .  Clinic information and appointment time provided to patient. Appointment information also placed on AVS.  Assessment:       Home Environment: lives alone. Stated that he plans on staying in his current apartment and is no longer looking for another place to live.        Support System:  He has a son and daughter who live very close to him. His daughter is able to provide more support for him than his son is able to provide. They both live about 2 miles from him. He said that his income is about $1100/month.        Level of functioning: independent.        Home DME: has RW and a glucometer.        Home care services: (services arranged prior to discharge or new services after discharge): none. He has been following up at the coumadin clinic.        Transportation:  He drives but is not been able to drive lately due to his bilateral knee pain.         Food/Nutrition: (ability to afford, access, use of any community resources) He has access to food. Receives $16/month in food stamps.         Medications: (ability to afford, access, compliance, Pharmacy used) Currently uses [facility] for his medications. No problems reported with obtaining medications.         Identified Barriers: lack of insurance drug coverage. An interpreter can be used if needed to address the language barrier.         PCP (Name, office location, phone number): Dr Jarold Song              Arranged services:        Services communicated to/with Jasmine Pang, RN CM

## 2016-10-06 NOTE — Consult Note (Signed)
Cardiology Consult    Patient ID: Michael [name] MRN: [id number], DOB/AGE: [birth year]   Admit date: 10/05/2016 Date of Consult: 10/06/2016  Primary Physician: [name], MD Reason for Consult: need for anticoagulation  Primary Cardiologist: Dr. Irish Lack Requesting Provider: Dr. Carolin Sicks   Patient Profile    Michael [name] is a 55 year old male with a past medical history of CKD stage IV, chronic systolic CHF EF of 35-70%, DM, tricuspid valve repair, HTN, OSA, mitral valve repair with bioprosthetic valve in 2012, persistent atrial fibrillation on Coumadin at home. Presented with bilateral knee pain. Had some epitaxis on admission, cardiology was consulted for input regarding need for long term anticoagulation.   History of Present Illness    Michael [name] is normally maintained on Coumadin at home for his chronic atrial fibrillation. INR was 1.93 on admission. He had some nose bleeds and some epistaxis, hemoglobin was 10.7 on admission, 9.2 today.   Patient tells me that he does not have nosebleeds at home, the first time was today. He had a laceration on his tongue that resolved with the application of Tranexamic acid. He denies ever having any bleeding issues prior to this hospitalization. His INR is checked at our office on [facility].   Also with history of chronic systolic CHF, last Echo was in March of 2017. EF was 40-45%, stable MR.   Past Medical History   Past Medical History:  Diagnosis Date  . Anemia of chronic disease   . Asthma   . Chronic kidney disease (CKD), stage IV (severe) ([place])   . Chronic systolic CHF (congestive heart failure) (HCC)    a. prior EF normal; b. Echo 10/16: Mild LVH, EF 30-35%, mitral valve bioprosthesis present without evidence of stenosis or regurgitation, severe LAE, mild RVE with mild to moderately reduced RVSF, moderate RAE  . Coronary artery disease   . Diabetes mellitus type 2 in obese ([place])   . Diabetes mellitus without  complication ([place])   . GERD (gastroesophageal reflux disease)   . H/O tricuspid valve repair 2012  . History of echocardiogram    Echo at [facility] in [facility] 4/12: mod LVH, EF 60-65%, mod LAE, tissue MVR ok, mod TR, mod pulmo HTN with RVSP 48 mmHg  . Hypertension   . Obstructive sleep apnea   . Paroxysmal atrial fibrillation (HCC)    s/p Maze procedure at time of MVR in 2011  . Pulmonary HTN   . 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 CARDIAC SURGERY       Allergies  No Known Allergies  Inpatient Medications    . allopurinol  300 mg Oral Daily  . atorvastatin  20 mg Oral Daily  . colchicine  0.6 mg Oral BID  . folic acid  1 mg Oral Daily  . insulin aspart  0-5 Units Subcutaneous QHS  . insulin aspart  0-9 Units Subcutaneous TID WC  . insulin glargine  5 Units Subcutaneous QHS  . metoprolol  50 mg Oral BID  . pantoprazole  40 mg Oral Daily  . predniSONE  50 mg Oral Q breakfast  . sodium chloride flush  3 mL Intravenous Q12H  . Warfarin - Pharmacist Dosing Inpatient   Does not apply q1800    Family History    Family History  Problem Relation Age of Onset  . Heart attack Neg Hx     Social History    Social  History   Social History  . Marital status: Single    Spouse name: N/A  . Number of children: N/A  . Years of education: N/A   Occupational History  . Not on file.   Social History Main Topics  . Smoking status: Former Research scientist (life sciences)  . Smokeless tobacco: Former Systems developer  . Alcohol use No  . Drug use: No  . Sexual activity: Not on file   Other Topics Concern  . Not on file   Social History Narrative  . No narrative on file     Review of Systems    General:  No chills, fever, night sweats or weight changes.  Cardiovascular:  No chest pain, dyspnea on exertion, edema, orthopnea, palpitations, paroxysmal nocturnal dyspnea. Dermatological: No rash, lesions/masses Respiratory: No cough,  dyspnea Urologic: No hematuria, dysuria Abdominal:   No nausea, vomiting, diarrhea, bright red blood per rectum, melena, or hematemesis Neurologic:  No visual changes, wkns, changes in mental status. All other systems reviewed and are otherwise negative except as noted above.  Physical Exam    Blood pressure 110/77, pulse (!) 114, temperature 98.3 F (36.8 C), temperature source Oral, resp. rate 18, height 5\' 5"  (1.651 m), weight 159 lb 2.8 oz (72.2 kg), SpO2 100 %.  General: Pleasant, NAD Psych: Normal affect. Neuro: Alert and oriented X 3. Moves all extremities spontaneously. HEENT: Normal  Neck: Supple without bruits or JVD. Lungs:  Resp regular and unlabored, CTA. Heart: irregular rhythm. no s3, s4, or murmurs. Abdomen: Soft, non-tender, non-distended, BS + x 4.  Extremities: No clubbing, cyanosis or edema. DP/PT/Radials 2+ and equal bilaterally.  Labs     Lab Results  Component Value Date   WBC 8.7 10/06/2016   HGB 9.2 (L) 10/06/2016   HCT 28.0 (L) 10/06/2016   MCV 74.5 (L) 10/06/2016   PLT 288 10/06/2016    Recent Labs Lab 10/05/16 0147 10/06/16 0555  NA 135 135  K 3.9 3.9  CL 97* 99*  CO2 27 27  BUN 35* 40*  CREATININE 2.35* 2.36*  CALCIUM 9.1 8.9  PROT 8.2*  --   BILITOT 0.7  --   ALKPHOS 129*  --   ALT 7*  --   AST 24  --   GLUCOSE 148* 99   Lab Results  Component Value Date   CHOL 107 02/27/2016   HDL 24 (L) 02/27/2016   LDLCALC 69 02/27/2016   TRIG 72 02/27/2016   Lab Results  Component Value Date   DDIMER 0.99 (H) 09/10/2015     Radiology Studies    Dg Chest 2 View  Result Date: 10/05/2016 CLINICAL DATA:  Active bleeding from the back of the mouth. Initial encounter. EXAM: CHEST  2 VIEW COMPARISON:  Chest radiograph performed 06/01/2016 FINDINGS: The lungs are well-aerated. Vascular congestion is noted. Increased interstitial markings raise concern for mild interstitial edema. Mild left midlung opacity could reflect superimposed pneumonia.  Would correlate for associated symptoms. A small left pleural effusion is noted. No pneumothorax is seen. The heart is enlarged. The patient is status post median sternotomy. No acute osseous abnormalities are seen. IMPRESSION: 1. Vascular congestion and cardiomegaly. Increased interstitial markings raise concern for mild pulmonary edema. 2. Mild left midlung opacity could reflect superimposed pneumonia. Would correlate for associated symptoms. Small left pleural effusion noted. Electronically Signed   By: Garald Balding M.D.   On: 10/05/2016 01:53   Dg Knee Complete 4 Views Left  Result Date: 10/05/2016 CLINICAL DATA:  Acute onset of bilateral  knee pain and swelling. Initial encounter. EXAM: LEFT KNEE - COMPLETE 4+ VIEW COMPARISON:  None. FINDINGS: There is no evidence of fracture or dislocation. The joint spaces are preserved. No significant degenerative change is seen; the patellofemoral joint is grossly unremarkable in appearance. A small to moderate knee joint effusion is noted. The visualized soft tissues are otherwise unremarkable in appearance. IMPRESSION: 1. No evidence of fracture or dislocation. 2. Small to moderate knee joint effusion noted. Electronically Signed   By: Garald Balding M.D.   On: 10/05/2016 01:51   Dg Knee Complete 4 Views Right  Result Date: 10/05/2016 CLINICAL DATA:  Acute onset of bilateral knee pain and swelling. Initial encounter. EXAM: RIGHT KNEE - COMPLETE 4+ VIEW COMPARISON:  None. FINDINGS: There is no evidence of fracture or dislocation. The joint spaces are preserved. No significant degenerative change is seen; the patellofemoral joint is grossly unremarkable in appearance. No significant joint effusion is seen. The visualized soft tissues are normal in appearance. IMPRESSION: No evidence of fracture or dislocation. Electronically Signed   By: Garald Balding M.D.   On: 10/05/2016 01:51    EKG & Cardiac Imaging    EKG: atrial fibrillation   Echocardiogram: March  2017 Study Conclusions  - Left ventricle: The cavity size was normal. Wall thickness was   increased in a pattern of moderate LVH. Systolic function was   mildly to moderately reduced. The estimated ejection fraction was   in the range of 40% to 45%. - Ventricular septum: The contour showed diastolic flattening and   systolic flattening. - Aortic valve: There was moderate regurgitation. - Mitral valve: A bioprosthesis was present. Valve area by pressure   half-time: 2.06 cm^2. - Left atrium: The atrium was severely dilated. - Right ventricle: The cavity size was moderately dilated. Systolic   function was mildly to moderately reduced. - Right atrium: The atrium was moderately to severely dilated. - Tricuspid valve: There was severe regurgitation.  Assessment & Plan    1. Chronic atrial fibrillation/Need for long term anticoagulation: patient with history of afib, in Afib now. Also with history of mitral valve repair. Cardiology consulted regarding long term need for anticoagulation, now with nosebleed and epistaxis. Epistaxis resolved as there was a laceration on his tongue. Patient has only had one nosebleed. Would recommend continuing Coumadin and following his hemoglobin in our office at the end of the week. He is not a candidate for a NOAC due to his CKD.   2. Chronic systolic CHF: stable. Continue metoprolol, not on ACE-I or ARB due to renal insufficiency. Lasix d/c'd, would restart at 40mg  daily.   3. History of bioprosthetic mitral valve replacement.   4. Bilateral knee pain  5. DM: Per primary team.     Signed, Arbutus Leas, NP 10/06/2016, 6:18 PM Pager: [phone]  I have seen and examined the patient along with Arbutus Leas, NP.  I have reviewed the chart, notes and new data.  I agree with NP's note.   PLAN: Michael [name] has a high risk of AFib-related embolic events, including stroke in the setting of valvular heart disease, severe atrial dilation and  depressed LVEF/CHF.   While it may be appropriate for anticoagulation to be briefly interrupted due to surgical procedures or bleeding complications, he should remain on lifelong anticoagulation with warfarin for the long term. His bleeding complications have been generally mild and self-limiting and they do not outweigh the risk of stroke in my opinion.  Unfortunately, in the setting  of valvular heart disease, DOACs are not an appropriate alternative to warfarin. He has been very compliant with anticoagulation clinic follow up (27 visits in last 50 weeks), albeit with rather variable INR.  Sanda Klein, MD, [place] [phone] 10/06/2016, 7:14 PM

## 2016-10-06 NOTE — Progress Notes (Signed)
PROGRESS NOTE    Michael [name]  SAY:[id number] DOB: 1961-04-11 DOA: 10/05/2016 PCP: [name], MD   Brief Narrative: 55 y.o. male with medical history significant of Gout, CKD-IV, sCHF with EF 40-45%, DM, HTN, CAD, PAF on Coumdaing, and cirrhosis of the liver, cardiac, OSA not on CPAP, who presents with bilateral knee pain.  Assessment & Plan:   # Bilateral knee pain: exact etiology not clear. ? Acute gouty flare vs knee hematoma. Xray showed small to moderate knee effusion on left side, right knee xray unremarkable.  -I consulted orthopedics today. Discussed with Dr.Norris. Patient may need left knee drainage.  -Continue prednisone, colchicine -pain medications -HIV, RPR non reactive -no sign of infection.  # CKD (chronic kidney disease), stage IV ([place]): Creatinine level stable. Monitor BMP. Avoid nephrotoxins  #  Essential hypertension: Blood pressure acceptable. Continue to monitor.  #Anemia multifactorial in the setting of security and possible acute blood loss in the setting of mild epistaxis and possible knee hematoma.. Hemoglobin dropped to 9.2 from 10.7. Holding anticoagulation. Monitor closely.  # Chronic systolic CHF (congestive heart failure) ([place]): stable. Received a dose of lasix in Er. CXR with possible acute pulmonary congestion. Patient is stable.  # Diabetes mellitus with complication (HCC):resume lantus 10 unit, steroid may cause hyperglycemia.   #  Persistent A-fib ([place]): Continue beta blocker. Holding Coumadin today because of concern of bleeding (epistaxis and tongue bleed on admission). Discussed with the nurse.  I consulted cardiologist to evaluate for the need of long-term anticoagulation. -I will talk to pharmacist   # GERD (gastroesophageal reflux disease); on protonix   # Mouth bleeding in ER as per report: stopped now.     DVT prophylaxis: systemic ac SCD Code Status:full Family Communication:none Disposition Plan:likely dc home in 1-2  days. Consultants:   none  Procedures:none Antimicrobials:none  Subjective: Patient was seen and examined at bedside. Patient reported blood coming out from his nose while wiping and sneezing. Continue to have left knee pain. Denied headache, dizziness, nausea, vomiting, chest pain or shortness of breath.  Objective: Vitals:   10/05/16 2010 10/06/16 0525 10/06/16 0800 10/06/16 1024  BP: 106/76 110/80 110/76 123/67  Pulse: 98 70 (!) 141 78  Resp: 18 18 18    Temp: 98.3 F (36.8 C) 98 F (36.7 C) 97.4 F (36.3 C)   TempSrc: Oral Oral Oral   SpO2: 99% 98% 100%   Weight: 72.2 kg (159 lb 2.8 oz)     Height:        Intake/Output Summary (Last 24 hours) at 10/06/16 1210 Last data filed at 10/06/16 1024  Gross per 24 hour  Intake             1435 ml  Output             1900 ml  Net             -465 ml   Filed Weights   10/05/16 0409 10/05/16 0820 10/05/16 2010  Weight: 76.2 kg (168 lb) 72.2 kg (159 lb 2.8 oz) 72.2 kg (159 lb 2.8 oz)    Examination:  General exam: Appears calm and comfortable  No active nose bleeding. Respiratory system: Clear bilateral, no wheezing or crackle. Cardiovascular system: Regular rate rhythm, S1-S2 normal. No pedal edema. Gastrointestinal system: Abdomen is nondistended, soft and nontender. Normal bowel sounds heard. Central nervous system: Alert and oriented. No focal neurological deficits. Extremities: Left knee swollen and restricted movement. No warmth or tenderness. Right knee unremarkable. Skin: No  lesions or ulcers Psychiatry: Judgement and insight appear normal. Mood & affect appropriate.     Data Reviewed: I have personally reviewed following labs and imaging studies  CBC:  Recent Labs Lab 10/05/16 0147 10/06/16 0555  WBC 7.8 8.7  NEUTROABS 5.7  --   HGB 10.7* 9.2*  HCT 32.6* 28.0*  MCV 75.1* 74.5*  PLT 303 494   Basic Metabolic Panel:  Recent Labs Lab 10/05/16 0147 10/06/16 0555  NA 135 135  K 3.9 3.9  CL 97* 99*   CO2 27 27  GLUCOSE 148* 99  BUN 35* 40*  CREATININE 2.35* 2.36*  CALCIUM 9.1 8.9   GFR: Estimated Creatinine Clearance: 30.4 mL/min (by C-G formula based on SCr of 2.36 mg/dL (H)). Liver Function Tests:  Recent Labs Lab 10/05/16 0147  AST 24  ALT 7*  ALKPHOS 129*  BILITOT 0.7  PROT 8.2*  ALBUMIN 3.1*   No results for input(s): LIPASE, AMYLASE in the last 168 hours.  Recent Labs Lab 10/05/16 0841  AMMONIA 34   Coagulation Profile:  Recent Labs Lab 10/05/16 0147 10/06/16 0555  INR 1.93 2.11   Cardiac Enzymes: No results for input(s): CKTOTAL, CKMB, CKMBINDEX, TROPONINI in the last 168 hours. BNP (last 3 results) No results for input(s): PROBNP in the last 8760 hours. HbA1C: No results for input(s): HGBA1C in the last 72 hours. CBG:  Recent Labs Lab 10/05/16 1208 10/05/16 1626 10/05/16 2009 10/06/16 0800 10/06/16 1138  GLUCAP 247* 312* 190* 100* 303*   Lipid Profile: No results for input(s): CHOL, HDL, LDLCALC, TRIG, CHOLHDL, LDLDIRECT in the last 72 hours. Thyroid Function Tests: No results for input(s): TSH, T4TOTAL, FREET4, T3FREE, THYROIDAB in the last 72 hours. Anemia Panel: No results for input(s): VITAMINB12, FOLATE, FERRITIN, TIBC, IRON, RETICCTPCT in the last 72 hours. Sepsis Labs: No results for input(s): PROCALCITON, LATICACIDVEN in the last 168 hours.  No results found for this or any previous visit (from the past 240 hour(s)).       Radiology Studies: Dg Chest 2 View  Result Date: 10/05/2016 CLINICAL DATA:  Active bleeding from the back of the mouth. Initial encounter. EXAM: CHEST  2 VIEW COMPARISON:  Chest radiograph performed 06/01/2016 FINDINGS: The lungs are well-aerated. Vascular congestion is noted. Increased interstitial markings raise concern for mild interstitial edema. Mild left midlung opacity could reflect superimposed pneumonia. Would correlate for associated symptoms. A small left pleural effusion is noted. No  pneumothorax is seen. The heart is enlarged. The patient is status post median sternotomy. No acute osseous abnormalities are seen. IMPRESSION: 1. Vascular congestion and cardiomegaly. Increased interstitial markings raise concern for mild pulmonary edema. 2. Mild left midlung opacity could reflect superimposed pneumonia. Would correlate for associated symptoms. Small left pleural effusion noted. Electronically Signed   By: Garald Balding M.D.   On: 10/05/2016 01:53   Dg Knee Complete 4 Views Left  Result Date: 10/05/2016 CLINICAL DATA:  Acute onset of bilateral knee pain and swelling. Initial encounter. EXAM: LEFT KNEE - COMPLETE 4+ VIEW COMPARISON:  None. FINDINGS: There is no evidence of fracture or dislocation. The joint spaces are preserved. No significant degenerative change is seen; the patellofemoral joint is grossly unremarkable in appearance. A small to moderate knee joint effusion is noted. The visualized soft tissues are otherwise unremarkable in appearance. IMPRESSION: 1. No evidence of fracture or dislocation. 2. Small to moderate knee joint effusion noted. Electronically Signed   By: Garald Balding M.D.   On: 10/05/2016 01:51   Dg  Knee Complete 4 Views Right  Result Date: 10/05/2016 CLINICAL DATA:  Acute onset of bilateral knee pain and swelling. Initial encounter. EXAM: RIGHT KNEE - COMPLETE 4+ VIEW COMPARISON:  None. FINDINGS: There is no evidence of fracture or dislocation. The joint spaces are preserved. No significant degenerative change is seen; the patellofemoral joint is grossly unremarkable in appearance. No significant joint effusion is seen. The visualized soft tissues are normal in appearance. IMPRESSION: No evidence of fracture or dislocation. Electronically Signed   By: Garald Balding M.D.   On: 10/05/2016 01:51        Scheduled Meds: . allopurinol  300 mg Oral Daily  . atorvastatin  20 mg Oral Daily  . colchicine  0.6 mg Oral BID  . folic acid  1 mg Oral Daily  .  insulin aspart  0-5 Units Subcutaneous QHS  . insulin aspart  0-9 Units Subcutaneous TID WC  . insulin glargine  5 Units Subcutaneous QHS  . metoprolol  50 mg Oral BID  . pantoprazole  40 mg Oral Daily  . predniSONE  50 mg Oral Q breakfast  . sodium chloride flush  3 mL Intravenous Q12H  . Warfarin - Pharmacist Dosing Inpatient   Does not apply q1800   Continuous Infusions:   LOS: 1 day    Nansi [place] Tanna Furry, MD [facility] Pager [phone]  If 7PM-7AM, please contact night-coverage [email or web address] Password [email or web address] 10/06/2016, 12:10 PM

## 2016-10-06 NOTE — Evaluation (Signed)
Physical Therapy Evaluation Patient Details Name: Michael [name] MRN: [id number] DOB: [birth year] Today's Date: 10/06/2016   History of Present Illness  Pt is a 55 yo male admitted with B knee pain.  Xrays are negative and no infection.  Pt with history of gout., CKD, CHF, EF 45%, DM, HTN, CAD, PAF, cirrhosis of liver.  Clinical Impression  Pt is up to walk with PT and noted fairly safe management but pain is making him a minor fall risk. Minor buckling appearance of L knee with turns esp.  Will continue to work acutely on strengthening with LLE and gait safety, and transition to HHPT due to his ongoing pain of the gout in knees esp LLE.      Follow Up Recommendations Home health PT;Supervision - Intermittent    Equipment Recommendations  None recommended by PT    Recommendations for Other Services       Precautions / Restrictions Precautions Precautions: Fall Restrictions Weight Bearing Restrictions: No      Mobility  Bed Mobility Overal bed mobility: Modified Independent             General bed mobility comments: HOB at 30 degrees  Transfers Overall transfer level: Modified independent Equipment used: Rolling walker (2 wheeled)             General transfer comment: pt was cued for hand placement but able to stand with no extra help  Ambulation/Gait Ambulation/Gait assistance: Min guard Ambulation Distance (Feet): 125 Feet Assistive device: Rolling walker (2 wheeled);1 person hand held assist Gait Pattern/deviations: Wide base of support;Decreased stride length;Decreased stance time - left (wide turns, slow pace) Gait velocity: reduced Gait velocity interpretation: Below normal speed for age/gender General Gait Details: Pt is safe to walk but limited distance so he can avoid excerbating his knees  Stairs            Wheelchair Mobility    Modified Rankin (Stroke Patients Only)       Balance Overall balance assessment: No apparent balance  deficits (not formally assessed)                                           Pertinent Vitals/Pain Pain Assessment: Faces Faces Pain Scale: Hurts even more Pain Location: B gouty knees Pain Descriptors / Indicators: Aching Pain Intervention(s): Monitored during session;Premedicated before session;Repositioned    Home Living Family/patient expects to be discharged to:: Private residence Living Arrangements: Alone Available Help at Discharge: Family;Available PRN/intermittently Type of Home: Apartment Home Access: Stairs to enter Entrance Stairs-Rails: None Entrance Stairs-Number of Steps: 2 Home Layout: One level Home Equipment: Walker - 2 wheels Additional Comments: Son helps but works during the day.  Daughter lives in town as well.    Prior Function Level of Independence: Independent with assistive device(s)         Comments: On disability following heart surgery     Hand Dominance   Dominant Hand: Right    Extremity/Trunk Assessment   Upper Extremity Assessment: Overall WFL for tasks assessed           Lower Extremity Assessment: Generalized weakness      Cervical / Trunk Assessment: Normal  Communication   Communication: No difficulties  Cognition Arousal/Alertness: Awake/alert Behavior During Therapy: WFL for tasks assessed/performed Overall Cognitive Status: Within Functional Limits for tasks assessed  General Comments General comments (skin integrity, edema, etc.): pain is pt's main limitation    Exercises Other Exercises Other Exercises: Pt has 4+ to 5 RLE strength and 4- LLE   Assessment/Plan    PT Assessment Patient needs continued PT services  PT Problem List Decreased strength;Decreased range of motion;Decreased activity tolerance;Decreased balance;Decreased mobility;Decreased coordination;Pain          PT Treatment Interventions DME instruction;Gait training;Functional mobility  training;Therapeutic activities;Therapeutic exercise;Balance training;Neuromuscular re-education;Patient/family education    PT Goals (Current goals can be found in the Care Plan section)  Acute Rehab PT Goals Patient Stated Goal: to go home PT Goal Formulation: With patient Time For Goal Achievement: 10/20/16 Potential to Achieve Goals: Good    Frequency Min 3X/week   Barriers to discharge Decreased caregiver support      Co-evaluation               End of Session Equipment Utilized During Treatment: Gait belt Activity Tolerance: Patient tolerated treatment well;Patient limited by pain Patient left: in bed;with call bell/phone within reach (has not been using bed alarm when PT arrived) Nurse Communication: Mobility status         Time: 8110-3159 PT Time Calculation (min) (ACUTE ONLY): 23 min   Charges:   PT Evaluation $PT Eval Moderate Complexity: 1 Procedure PT Treatments $Gait Training: 8-22 mins   PT G Codes:        Ramond Dial 24-Oct-2016, 2:50 PM    Mee Hives, PT MS Acute Rehab Dept. Number: [place] and [place]

## 2016-10-06 NOTE — Progress Notes (Addendum)
Initial Nutrition Assessment  DOCUMENTATION CODES:   Not applicable  INTERVENTION:   Snacks ordered for this patient twice a day.    NUTRITION DIAGNOSIS:   Unintentional weight loss related to other (see comment) (unsure) as evidenced by 15 percent weight loss over the past 6 months.  GOAL:   Patient will meet greater than or equal to 90% of their needs  MONITOR:   PO intake  REASON FOR ASSESSMENT:   Malnutrition Screening Tool    ASSESSMENT:   55 y.o.malewith medical history significant of Gout, CKD-IV, sCHF with EF 40-45%, DM, HTN, CAD, PAF on [place], and cirrhosis of the liver, cardiac, OSA not on CPAP, who presents with bilateral knee pain.  Met with patient in room today. Patient eating 100% meals. Patient denies N/V. Patient has had unintentional wt loss of 15% over the past 6 months; unsure of etiology. Will order snacks to increase kcal/day.    Medications reviewed and include: allopurinol, folic acid, insulin, pantoprazole, prednisone, warfarin   Labs reviewed: Cl 99(L), BUN 40(H), creat 2.36(H), AlkPhos 129 (h)-11/9, Alb 3.1(L) 11/18, BNP 237.4 (h) Hgb 9.2(L), Hct 28(L) BGs- 99, 148, AIC 6.2-06/17/16  Nutrition-Focused physical exam completed. Findings are no fat depletion, no muscle depletion, and no edema.   Diet Order:  Diet heart healthy/carb modified Room service appropriate? Yes; Fluid consistency: Thin  Skin:  Reviewed, no issues  Last BM:  11/19  Height:   Ht Readings from Last 1 Encounters:  10/05/16 _0  (1.651 m)    Weight:   Wt Readings from Last 1 Encounters:  10/05/16 159 lb 2.8 oz (72.2 kg)    Ideal Body Weight:  61.8 kg  BMI:  Body mass index is 26.49 kg/m.  Estimated Nutritional Needs:   Kcal:  1800-2200kcal/day   Protein:  87-100g/day   Fluid:  2L/day   EDUCATION NEEDS:   No education needs identified at this time  Koleen Distance, RD, LDN

## 2016-10-06 NOTE — Progress Notes (Addendum)
Inpatient Diabetes Program Recommendations  AACE/ADA: New Consensus Statement on Inpatient Glycemic Control (2015)  Target Ranges:  Prepandial:   less than 140 mg/dL      Peak postprandial:   less than 180 mg/dL (1-2 hours)      Critically ill patients:  140 - 180 mg/dL   Lab Results  Component Value Date   GLUCAP 303 (H) 10/06/2016   HGBA1C 6.2 06/17/2016    Review of Glycemic Control  Diabetes history: DM 2 Outpatient Diabetes medications: reported none taken at this time Current orders for Inpatient glycemic control: Lantus 5 QHS, Novolog Sensitive + HS scale  Inpatient Diabetes Program Recommendations:   Taking prednisone 50 mg Daily. Patient's glucose increased to 300s with meals. Please consider Novolog 3-5 units TID if patient consumes at least 50% of meals.  Thanks,  Tama Headings RN, MSN, [facility] Inpatient Diabetes Coordinator Team Pager [phone] (8a-5p)

## 2016-10-07 DIAGNOSIS — I10 Essential (primary) hypertension: Secondary | ICD-10-CM

## 2016-10-07 LAB — GLUCOSE, CAPILLARY
Glucose-Capillary: 203 mg/dL — ABNORMAL HIGH (ref 65–99)
Glucose-Capillary: 247 mg/dL — ABNORMAL HIGH (ref 65–99)
Glucose-Capillary: 192 mg/dL — ABNORMAL HIGH (ref 65–99)

## 2016-10-07 LAB — PROTIME-INR
INR: 1.97
PROTHROMBIN TIME: 22.7 s — AB (ref 11.4–15.2)

## 2016-10-07 MED ORDER — WARFARIN SODIUM 3 MG PO TABS
3.0000 mg | ORAL_TABLET | Freq: Once | ORAL | Status: DC
  Filled 2016-10-07: qty 1

## 2016-10-07 MED ORDER — METOPROLOL TARTRATE 100 MG PO TABS
100.0000 mg | ORAL_TABLET | Freq: Two times a day (BID) | ORAL | Status: DC
  Administered 2016-10-07: 100 mg via ORAL
  Filled 2016-10-07: qty 2

## 2016-10-07 MED ORDER — ACETAMINOPHEN 500 MG PO TABS: 500.0000 mg | ORAL_TABLET | Freq: Four times a day (QID) | ORAL | 0 refills | Status: DC | PRN

## 2016-10-07 NOTE — Discharge Summary (Addendum)
Physician Discharge Summary  Michael [name] LOV:[id number] DOB: Aug 28, 1961 DOA: 10/05/2016  PCP: [name], MD  Admit date: 10/05/2016 Discharge date: 10/07/2016  Admitted From:Home Disposition:home   Recommendations for Outpatient Follow-up:  1. Follow up with PCP in 1-2 weeks 2. Please obtain BMP/CBC in one week   Home Health:no Equipment/Devices:no Discharge Condition:stable CODE STATUS:full Diet recommendation:Carb voided modified heart healthy diet.  Brief/Interim Summary:55 y.o.malewith medical history significant of Gout, CKD-IV, sCHF with EF 40-45%, DM, HTN, CAD, PAF on [place], and cirrhosis of the liver, cardiac, OSA not on CPAP, who presents with bilateral knee pain.  # Bilateral knee pain: exact etiology not clear. Xray showed small to moderate knee effusion on left side, right knee xray unremarkable. Suspected gouty flare on admission and treated with a steroid, colchicine. Evaluated by orthopedics and patient underwent injection treatment. As per orthopedics both knees were injected with 3/3/1 marcaine/lidocaine/kenalog. No evidence of gout in aspirate of knee as per orthopedics. The synovial fluid was clear. Steroid discontinued. Patient clinically improved. Reported significant improvement in pain today. Able to ambulate. Patient is being discharged home with outpatient follow-up with PCP. -HIV, RPR non reactive -no sign of infection.  # CKD (chronic kidney disease), stage IV ([place]): Creatinine level stable. Monitor BMP. Avoid nephrotoxins. I advised patient to follow-up with his nephrologist.  #  Essential hypertension: Blood pressure acceptable. Continue to monitor.  #Anemia multifactorial in the setting of security and possible acute blood loss in the setting of mild epistaxis and possible knee hematoma.. No further bleeding. Continue to monitor CBC as an outpatient. Advised to was for any sign of bleeding.  # Chronic systolic CHF (congestive heart  failure) ([place]): stable. Received a dose of lasix in Er. CXR with possible acute pulmonary congestion. Patient is stable.  # Diabetes mellitus with complication [facility]): Advised to monitor blood sugar level and outpatient follow-up..   #  Persistent A-fib [facility]): Evaluated by cardiologist recommended outpatient anticoagulation with Coumadin. Patient with no further epistaxis or mouth.. I advised patient to watch for any sign of bleeding. Advised to follow-up with his cardiologist. Patient's heart rate was high today likely because of low metoprolol dose yesterday. The dose of metoprolol was reduced yesterday because of hypotensive episode. Today, he received 100 mg of metoprolol the morning. We will monitor his heart rate. Patient is asymptomatic.   # GERD (gastroesophageal reflux disease); on protonix   # Mouth bleeding in ER as per report: stopped now.    Discharge Diagnoses:  Principal Problem:   Bilateral knee pain Active Problems:   CKD (chronic kidney disease), stage IV (HCC)   Essential hypertension   Chronic systolic CHF (congestive heart failure) (HCC)   Gout   Diabetes mellitus with complication (HCC)   A-fib (HCC)   GERD (gastroesophageal reflux disease)   Liver cirrhosis (HCC)   HLD (hyperlipidemia)   Mouth bleeding   Petechial rash    Discharge Instructions  Discharge Instructions    Call MD for:  difficulty breathing, headache or visual disturbances    Complete by:  As directed    Call MD for:  extreme fatigue    Complete by:  As directed    Call MD for:  hives    Complete by:  As directed    Call MD for:  persistant dizziness or light-headedness    Complete by:  As directed    Call MD for:  persistant nausea and vomiting    Complete by:  As directed    Call MD  for:  severe uncontrolled pain    Complete by:  As directed    Call MD for:  temperature >100.4    Complete by:  As directed    Diet - low sodium heart healthy    Complete by:  As directed    Diet  Carb Modified    Complete by:  As directed    Discharge instructions    Complete by:  As directed    Please monitor blood sugar level at home.  Please follow up with your nephrologist, cardiologist, primary care doctor.   Increase activity slowly    Complete by:  As directed        Medication List    STOP taking these medications   colchicine 0.6 MG tablet   HYDROcodone-acetaminophen 5-325 MG tablet Commonly known as:  NORCO/VICODIN   isosorbide mononitrate 30 MG 24 hr tablet Commonly known as:  IMDUR   potassium chloride SA 20 MEQ tablet Commonly known as:  K-DUR,KLOR-CON   predniSONE 20 MG tablet Commonly known as:  DELTASONE   traMADol 50 MG tablet Commonly known as:  ULTRAM   traZODone 50 MG tablet Commonly known as:  DESYREL     TAKE these medications   ACCU-CHEK AVIVA device Use as Instructed 3 times daily   accu-chek softclix lancets Use as instructed 3 times daily   acetaminophen 500 MG tablet Commonly known as:  TYLENOL Take 1 tablet (500 mg total) by mouth every 6 (six) hours as needed.   allopurinol 300 MG tablet Commonly known as:  ZYLOPRIM Take 1 tablet (300 mg total) by mouth daily.   atorvastatin 20 MG tablet Commonly known as:  LIPITOR Take 1 tablet (20 mg total) by mouth daily.   diclofenac sodium 1 % Gel Commonly known as:  VOLTAREN Apply 4 g topically 4 (four) times daily.   digoxin 0.125 MG tablet Commonly known as:  LANOXIN Take 1 tablet (0.125 mg total) by mouth daily.   feeding supplement (ENSURE ENLIVE) Liqd Take 237 mLs by mouth 2 (two) times daily between meals.   ferrous sulfate 325 (65 FE) MG tablet Take 1 tablet (325 mg total) by mouth daily with breakfast.   folic acid 1 MG tablet Commonly known as:  FOLVITE Take 1 mg by mouth daily.   furosemide 80 MG tablet Commonly known as:  LASIX Take 1 tablet (80 mg total) by mouth 2 (two) times daily.   glucose blood test strip Commonly known as:  ACCU-CHEK AVIVA Use 3  times daily as directed.   Insulin Detemir 100 UNIT/ML Pen Commonly known as:  LEVEMIR FLEXTOUCH Inject 10 Units into the skin daily at 10 pm.   Insulin Pen Needle 31G X 5 MM Misc 1 each by Does not apply route at bedtime.   metoprolol 100 MG tablet Commonly known as:  LOPRESSOR Take 1 tablet (100 mg total) by mouth 2 (two) times daily.   omeprazole 20 MG capsule Commonly known as:  PRILOSEC Take 1 capsule (20 mg total) by mouth daily.   warfarin 2.5 MG tablet Commonly known as:  COUMADIN Take 1-2 tablets by mouth daily as directed by coumadin clinic What changed:  how much to take  how to take this  when to take this  additional instructions      Follow-up Information    [place]. Go on 10/16/2016.   Why:  at 9:45am for an appointment in the [facility] with Dr Lisette Grinder information: [phone]  [place] [phone]       [name], MD. Schedule an appointment as soon as possible for a visit in 1 week(s).   Specialty:  Family Medicine Contact information: [place] [address] [phone]          No Known Allergies  Consultations: Orthopedics and cardiologist  Procedures/Studies: Bilateral knee arthrocentesis and injection treatment  Subjective: Patient was seen and examined at bedside. Patient reported feeling much better. Denied any pain. Able to ambulate without difficulties. Denied epistaxis or mouth bleed. No headache, dizziness, blindness, nausea, vomiting, chest pain or shortness of breath.   Discharge Exam: Vitals:   10/07/16 1158 10/07/16 1632  BP:  129/74  Pulse: (!) 112 (!) 103  Resp:    Temp:  98.2 F (36.8 C)   Vitals:   10/07/16 0953 10/07/16 1116 10/07/16 1158 10/07/16 1632  BP: 117/71   129/74  Pulse: (!) 119 90 (!) 112 (!) 103  Resp: 16     Temp: 98.7 F (37.1 C)   98.2 F (36.8 C)  TempSrc: Oral   Oral   SpO2: 97%   99%  Weight:      Height:        General: Pt is alert, awake, not in acute distress No epistaxis Cardiovascular: RRR, S1/S2 +, no rubs, no gallops Respiratory: CTA bilaterally, no wheezing, no rhonchi Abdominal: Soft, NT, ND, bowel sounds + Extremities: no edema, no cyanosis Bilateral knee unremarkable exam.   The results of significant diagnostics from this hospitalization (including imaging, microbiology, ancillary and laboratory) are listed below for reference.     Microbiology: No results found for this or any previous visit (from the past 240 hour(s)).   Labs: BNP (last 3 results)  Recent Labs  02/19/16 1528 03/14/16 2243 10/05/16 0147  BNP 533.9* 1,080.3* 737.1*   Basic Metabolic Panel:  Recent Labs Lab 10/05/16 0147 10/06/16 0555  NA 135 135  K 3.9 3.9  CL 97* 99*  CO2 27 27  GLUCOSE 148* 99  BUN 35* 40*  CREATININE 2.35* 2.36*  CALCIUM 9.1 8.9   Liver Function Tests:  Recent Labs Lab 10/05/16 0147  AST 24  ALT 7*  ALKPHOS 129*  BILITOT 0.7  PROT 8.2*  ALBUMIN 3.1*   No results for input(s): LIPASE, AMYLASE in the last 168 hours.  Recent Labs Lab 10/05/16 0841  AMMONIA 34   CBC:  Recent Labs Lab 10/05/16 0147 10/06/16 0555  WBC 7.8 8.7  NEUTROABS 5.7  --   HGB 10.7* 9.2*  HCT 32.6* 28.0*  MCV 75.1* 74.5*  PLT 303 288   Cardiac Enzymes: No results for input(s): CKTOTAL, CKMB, CKMBINDEX, TROPONINI in the last 168 hours. BNP: Invalid input(s): POCBNP CBG:  Recent Labs Lab 10/06/16 1138 10/06/16 1704 10/06/16 2151 10/07/16 0740 10/07/16 1153  GLUCAP 303* 294* 180* 203* 247*   D-Dimer No results for input(s): DDIMER in the last 72 hours. Hgb A1c No results for input(s): HGBA1C in the last 72 hours. Lipid Profile No results for input(s): CHOL, HDL, LDLCALC, TRIG, CHOLHDL, LDLDIRECT in the last 72 hours. Thyroid function studies No results for input(s): TSH, T4TOTAL, T3FREE, THYROIDAB in the last 72  hours.  Invalid input(s): FREET3 Anemia work up No results for input(s): VITAMINB12, FOLATE, FERRITIN, TIBC, IRON, RETICCTPCT in the last 72 hours. Urinalysis    Component Value Date/Time   COLORURINE YELLOW 02/25/2016 1026   APPEARANCEUR CLEAR 02/25/2016 1026   LABSPEC 1.009  02/25/2016 1026   PHURINE 7.5 02/25/2016 1026   GLUCOSEU NEGATIVE 02/25/2016 1026   HGBUR MODERATE (A) 02/25/2016 1026   BILIRUBINUR NEGATIVE 02/25/2016 1026   KETONESUR NEGATIVE 02/25/2016 1026   PROTEINUR 100 (A) 02/25/2016 1026   NITRITE NEGATIVE 02/25/2016 1026   LEUKOCYTESUR NEGATIVE 02/25/2016 1026   Sepsis Labs Invalid input(s): PROCALCITONIN,  WBC,  LACTICIDVEN Microbiology No results found for this or any previous visit (from the past 240 hour(s)).   Time coordinating discharge: 28 minutes  SIGNED:   Rosita Fire, MD  [facility] 10/07/2016, 4:34 PM  If 7PM-7AM, please contact night-coverage [email or web address] Password [email or web address]

## 2016-10-07 NOTE — Progress Notes (Signed)
ANTICOAGULATION CONSULT NOTE - Follow Up Consult  Pharmacy Consult for Warfarin Indication: atrial fibrillation  No Known Allergies  Patient Measurements: Height: 5\' 5"  (165.1 cm) Weight: 172 lb 6.4 oz (78.2 kg) IBW/kg (Calculated) : 61.5  Vital Signs: Temp: 98.7 F (37.1 C) (11/21 0953) Temp Source: Oral (11/21 0953) BP: 117/71 (11/21 0953) Pulse Rate: 112 (11/21 1158)  Labs:  Recent Labs  10/05/16 0147 10/06/16 0555 10/07/16 0649  HGB 10.7* 9.2*  --   HCT 32.6* 28.0*  --   PLT 303 288  --   LABPROT 22.4* 24.0* 22.7*  INR 1.93 2.11 1.97  CREATININE 2.35* 2.36*  --     Estimated Creatinine Clearance: 33.7 mL/min (by C-G formula based on SCr of 2.36 mg/dL (H)).   Assessment: 15 YOM who presented on 11/19 with bilateral knee pain and mouth bleeding. The patient was on warfarin PTA for hx Afib with admit INR 1.93. Mouth bleeding stopped with tranexamic acid, and warfarin was resumed. Warfarin was held 11/20 per  MD Carolin Sicks). Cardiology was consulted and stated that benefits>risks and to stay on warfarin.   INR today is SUBtherapeutic after holding the dose yesterday evening (INR 1.97 << 2.11, goal of 2-3) . Plans are for likely discharge today - to follow-up with cardiology at the end of the week for Hgb follow-up.  Goal of Therapy:  INR 2-3   Plan:  1. Warfarin 3 mg x 1 dose if still here this evening 2. Will continue to monitor for any signs/symptoms of bleeding and will follow up with PT/INR in the a.m. (if still here)  Thank you for allowing pharmacy to be a part of this patient's care.  Alycia Rossetti, PharmD, BCPS Clinical Pharmacist Pager: [phone] Clinical phone for 10/07/2016 from 7a-3:30p: [phone] If after 3:30p, please call main pharmacy at: [phone] 10/07/2016 3:29 PM

## 2016-10-07 NOTE — Progress Notes (Signed)
Orthopedics Progress Note  Subjective: Patient feeling much better today  Objective:  Vitals:   10/06/16 2153 10/07/16 0539  BP: 123/79 125/80  Pulse: (!) 116 (!) 131  Resp: 18 17  Temp: 97.8 F (36.6 C) 98.5 F (36.9 C)    General: Awake and alert  Musculoskeletal: bilateral knees with no swelling and no tenderness, pain free AROM Neurovascularly intact  Lab Results  Component Value Date   WBC 8.7 10/06/2016   HGB 9.2 (L) 10/06/2016   HCT 28.0 (L) 10/06/2016   MCV 74.5 (L) 10/06/2016   PLT 288 10/06/2016       Component Value Date/Time   NA 135 10/06/2016 0555   K 3.9 10/06/2016 0555   CL 99 (L) 10/06/2016 0555   CO2 27 10/06/2016 0555   GLUCOSE 99 10/06/2016 0555   BUN 40 (H) 10/06/2016 0555   CREATININE 2.36 (H) 10/06/2016 0555   CREATININE 1.49 (H) 01/25/2016 1527   CALCIUM 8.9 10/06/2016 0555   GFRNONAA 29 (L) 10/06/2016 0555   GFRAA 34 (L) 10/06/2016 0555    Lab Results  Component Value Date   INR 2.11 10/06/2016   INR 1.93 10/05/2016   INR 1.3 09/15/2016    Assessment/Plan:  s/p bilateral knee aspiration and injection with kenalog (40 mg each knee) Significant clinical improvement.  No evidence of gout in aspirate of knee - clear synovial fluid Continue mobilization as tolerated Outpatient follow up as needed, will sign off, thanks  [employer]. Veverly Fells, MD 10/07/2016 7:36 AM

## 2016-10-07 NOTE — Care Management Note (Signed)
Case Management Note  Patient Details  Name: Michael [name] MRN: [id number] Date of Birth: 1961/03/29  Subjective/Objective:         CM following for progression and d/c planning.            Action/Plan: 10/07/2016 Pt for d/c to home no HH needs.   Expected Discharge Date:    10/07/2016              Expected Discharge Plan:  Home/Self Care  In-House Referral:  NA  Discharge planning Services  NA  Post Acute Care Choice:  NA Choice offered to:  NA  DME Arranged:  N/A DME Agency:  NA  HH Arranged:  NA HH Agency:   NA  Status of Service:     If discussed at [place] of Stay Meetings, dates discussed:    Additional Comments:  [name], RN 10/07/2016, 4:18 PM

## 2016-10-08 ENCOUNTER — Other Ambulatory Visit: Payer: Self-pay | Admitting: Pharmacist

## 2016-10-08 ENCOUNTER — Telehealth: Payer: Self-pay

## 2016-10-08 DIAGNOSIS — Z794 Long term (current) use of insulin: Principal | ICD-10-CM

## 2016-10-08 DIAGNOSIS — E119 Type 2 diabetes mellitus without complications: Secondary | ICD-10-CM

## 2016-10-08 MED ORDER — ACCU-CHEK SOFTCLIX LANCETS MISC: 12 refills | Status: DC

## 2016-10-08 MED ORDER — GLUCOSE BLOOD VI STRP: ORAL_STRIP | 5 refills | Status: DC

## 2016-10-08 NOTE — Telephone Encounter (Signed)
[facility] Clinic Post-discharge Follow-Up Phone Call:  Date of Discharge: 10/07/2016 Principal Discharge Diagnosis(es):  Bilateral knee pain, CKD - stage 4, chronic systolic CHF, gout, HTN Post-discharge Communication: (Clearly document all attempts clearly and date contact made) call placed to the patient Call Completed: yes                    With Whom: Patient Interpreter Needed: No.  He had informed this CM that he did not want an interpreter.      Please check all that apply:  X Patient is knowledgeable of his/her condition(s) and/or treatment. ? Patient is caring for self at home.  X Patient is receiving assist at home from family and/or caregiver. Family and/or caregiver is knowledgeable of patient's condition(s) and/or treatment. - He lives alone but receives assistance from his daughter who lives a couple of miles away. She works and provides assistance when she is able.  ? Patient is receiving home health services. If so, name of agency.     Medication Reconciliation:  ? Medication list reviewed with patient. X -  Patient obtained all discharge medications. If not, why? He said that he has all of his medications. This CM inquired if is daughter has reviewed his medication list with him as he does not read [place]. The medication list is in [place] and even with a [name] interpreter on the phone, he is not able to read what is in front of him on the papers/bottles  that he has. He said that his daughter has not seen him yet but is planning to see him tomorrow and can review the medications at that time. Informed him that he has multiple medications that have been discontinued and it is very important she review the list with him.  He stated that he understood. He said that he needs to test strips for his glucometer as he is not able to check his blood sugars.  He requested that the prescription be sent to [facility] on [address] as it is close to his house.  Instructed him  regarding the importance of keeping a log of his blood sugars when he has obtained all of his testing supplies. Also again stressed the importance of having his daughter review the medication list with him as soon as possible and he again said that he understood.    Activities of Daily Living:  X  Independent - he has a RW but said that he doesn't need it now.  ? Needs assist (describe; ? home DME used) ? [facility] (describe, ? home DME used)   Community resources in place for patient:  X  None  ? Home Health/Home DME ? Assisted Living ? Support Group               Questions/Concerns discussed: He said that he is feeling " good" and denied any bleeding. Confirmed his appointment for 10/16/16 @ 0945.  He said that he will be able to drive to the clinic. He noted that he would like some glucerna when he comes to his appointment next week.    Nicoletta Ba, [facility] transferred the prescriptions for lancets and test strips to the [facility] on [facility]. Call placed to the patient and informed him that the prescriptions have been transferred. He was very appreciative of the call.

## 2016-10-15 ENCOUNTER — Telehealth: Payer: Self-pay

## 2016-10-15 NOTE — Telephone Encounter (Signed)
Call placed to the patient and confirmed his appointment for tomorrow at the [facility] at [facility] - 10/16/16 @ 0945. He said he would be there and he has transportation to the clinic. He noted that his daughter would not be with him but she has checked all of his medications. Reminded him to bring all of his medications with him to the appointment and he said that he understood.  He stated that he is doing " okay " and had no questions/concerns at this time.

## 2016-10-16 ENCOUNTER — Encounter: Payer: Self-pay | Admitting: Family Medicine

## 2016-10-16 ENCOUNTER — Ambulatory Visit: Payer: Medicare Other | Attending: Family Medicine | Admitting: Family Medicine

## 2016-10-16 VITALS — BP 94/60 | HR 96 | Temp 97.7°F | Ht 65.0 in | Wt 187.4 lb

## 2016-10-16 DIAGNOSIS — E118 Type 2 diabetes mellitus with unspecified complications: Secondary | ICD-10-CM

## 2016-10-16 DIAGNOSIS — I482 Chronic atrial fibrillation, unspecified: Secondary | ICD-10-CM

## 2016-10-16 DIAGNOSIS — Z794 Long term (current) use of insulin: Secondary | ICD-10-CM | POA: Diagnosis not present

## 2016-10-16 DIAGNOSIS — E785 Hyperlipidemia, unspecified: Secondary | ICD-10-CM | POA: Insufficient documentation

## 2016-10-16 DIAGNOSIS — M25571 Pain in right ankle and joints of right foot: Secondary | ICD-10-CM | POA: Diagnosis not present

## 2016-10-16 DIAGNOSIS — Z79899 Other long term (current) drug therapy: Secondary | ICD-10-CM | POA: Diagnosis not present

## 2016-10-16 DIAGNOSIS — Z952 Presence of prosthetic heart valve: Secondary | ICD-10-CM | POA: Diagnosis not present

## 2016-10-16 DIAGNOSIS — I130 Hypertensive heart and chronic kidney disease with heart failure and stage 1 through stage 4 chronic kidney disease, or unspecified chronic kidney disease: Secondary | ICD-10-CM | POA: Insufficient documentation

## 2016-10-16 DIAGNOSIS — I10 Essential (primary) hypertension: Secondary | ICD-10-CM

## 2016-10-16 DIAGNOSIS — N184 Chronic kidney disease, stage 4 (severe): Secondary | ICD-10-CM | POA: Insufficient documentation

## 2016-10-16 DIAGNOSIS — Z7901 Long term (current) use of anticoagulants: Secondary | ICD-10-CM | POA: Insufficient documentation

## 2016-10-16 DIAGNOSIS — I5022 Chronic systolic (congestive) heart failure: Secondary | ICD-10-CM | POA: Diagnosis not present

## 2016-10-16 DIAGNOSIS — E1122 Type 2 diabetes mellitus with diabetic chronic kidney disease: Secondary | ICD-10-CM | POA: Diagnosis not present

## 2016-10-16 DIAGNOSIS — M10071 Idiopathic gout, right ankle and foot: Secondary | ICD-10-CM | POA: Insufficient documentation

## 2016-10-16 DIAGNOSIS — M10079 Idiopathic gout, unspecified ankle and foot: Secondary | ICD-10-CM

## 2016-10-16 LAB — GLUCOSE, POCT (MANUAL RESULT ENTRY): POC GLUCOSE: 168 mg/dL — AB (ref 70–99)

## 2016-10-16 LAB — POCT GLYCOSYLATED HEMOGLOBIN (HGB A1C): Hemoglobin A1C: 7.1

## 2016-10-16 MED ORDER — INSULIN DETEMIR 100 UNIT/ML FLEXPEN: 10.0000 [IU] | PEN_INJECTOR | Freq: Every day | SUBCUTANEOUS | 11 refills | Status: DC

## 2016-10-16 MED ORDER — PREDNISONE 20 MG PO TABS: 40.0000 mg | ORAL_TABLET | Freq: Every day | ORAL | 0 refills | Status: DC

## 2016-10-16 MED ORDER — GLUCOSE BLOOD VI STRP: ORAL_STRIP | 5 refills | Status: DC

## 2016-10-16 MED ORDER — METOPROLOL TARTRATE 100 MG PO TABS: 100.0000 mg | ORAL_TABLET | Freq: Two times a day (BID) | ORAL | 5 refills | Status: DC

## 2016-10-16 MED ORDER — ACCU-CHEK SOFTCLIX LANCETS MISC: 12 refills | Status: DC

## 2016-10-16 MED FILL — predniSONE 20 MG TABS: 20 | 5 days supply | Qty: 10 | Fill #0

## 2016-10-16 MED FILL — LEVEMIR FLEXTOUCH 100 UNITS: 100 | 30 days supply | Qty: 3 | Fill #0

## 2016-10-16 MED FILL — METOPROLOL TARTRATE 100 MG: 100 | 30 days supply | Qty: 60 | Fill #0

## 2016-10-16 NOTE — Progress Notes (Signed)
Transition care clinic  Date of telephone encounter: 10/08/16  Hospitalization dates: 10/05/16 through 10/07/16 Subjective:  Patient ID: Michael [name], male    DOB: 1961-01-04  Age: 55 y.o. MRN: [id number]  CC: Diabetes and Foot Pain (bilateral)   HPI Michael [name]  is a 42 with a history of Type 2DM (A1c 7.1), hypertension, hyperlipidemia, mitral valve replacement with porcine valve, tricuspid valve repair, atrial fibrillation (on anticoagulation with Coumadin),Chronic systolic heartfailure  (EF 40 -45% from 01/2016), CKD stage IV, Gout who comes into the clinic for follow-up from hospitalization.  He had presented with bilateral knee pain and swelling; x-ray revealed small to moderate knee joint effusion in the left. Seen by orthopedics and underwent arthrocentesis of the left knee and injection of both knees with 3/3/3 of Marcaine/lidocaine/Kenalog and patient tolerated procedure well. Aspirate was negative for gout.  Today he informs me knee pain has resolved however he has throbbing pain in both ankles - 8/10 and is requesting a refill of tramadol. He denies ingestion of foods that trigger gout. His INR is managed by the Coumadin clinic and he will need to call them for an appointment as he missed his last appointment during hospitalization.  I have reviewed all his medications and he seems to be missing the digoxin - will need to follow-up with cardiology regarding this    Past Medical History:  Diagnosis Date  . Anemia of chronic disease   . Asthma   . Chronic kidney disease (CKD), stage IV (severe) ([place])   . Chronic systolic CHF (congestive heart failure) (HCC)    a. prior EF normal; b. Echo 10/16: Mild LVH, EF 30-35%, mitral valve bioprosthesis present without evidence of stenosis or regurgitation, severe LAE, mild RVE with mild to moderately reduced RVSF, moderate RAE  . Coronary artery disease   . Diabetes mellitus type 2 in obese ([place])   . Diabetes mellitus  without complication ([place])   . GERD (gastroesophageal reflux disease)   . H/O tricuspid valve repair 2012  . History of echocardiogram    Echo at [facility] in [facility] 4/12: mod LVH, EF 60-65%, mod LAE, tissue MVR ok, mod TR, mod pulmo HTN with RVSP 48 mmHg  . Hypertension   . Obstructive sleep apnea   . Paroxysmal atrial fibrillation (HCC)    s/p Maze procedure at time of MVR in 2011  . Pulmonary HTN   . 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 CARDIAC SURGERY      No Known Allergies   Outpatient Medications Prior to Visit  Medication Sig Dispense Refill  . acetaminophen (TYLENOL) 500 MG tablet Take 1 tablet (500 mg total) by mouth every 6 (six) hours as needed. 30 tablet 0  . allopurinol (ZYLOPRIM) 300 MG tablet Take 1 tablet (300 mg total) by mouth daily. 30 tablet 6  . atorvastatin (LIPITOR) 20 MG tablet Take 1 tablet (20 mg total) by mouth daily. 30 tablet 3  . diclofenac sodium (VOLTAREN) 1 % GEL Apply 4 g topically 4 (four) times daily. 937 g 0  . folic acid (FOLVITE) 1 MG tablet Take 1 mg by mouth daily.    . furosemide (LASIX) 80 MG tablet Take 1 tablet (80 mg total) by mouth 2 (two) times daily. 60 tablet 3  . warfarin (COUMADIN) 2.5 MG tablet Take 1-2 tablets by mouth daily as directed by coumadin clinic (Patient taking differently: Take 2.5-5 mg by mouth See  admin instructions. Take 2 tablet on Wednesday and Saturday then take 1 tablet all the other days) 40 tablet 3  . metoprolol (LOPRESSOR) 100 MG tablet Take 1 tablet (100 mg total) by mouth 2 (two) times daily. 60 tablet 3  . Blood Glucose Monitoring Suppl (ACCU-CHEK AVIVA) device Use as Instructed 3 times daily (Patient not taking: Reported on 10/16/2016) 1 each 0  . digoxin (LANOXIN) 0.125 MG tablet Take 1 tablet (0.125 mg total) by mouth daily. (Patient not taking: Reported on 10/16/2016) 30 tablet 11  . feeding supplement, ENSURE ENLIVE, (ENSURE ENLIVE)  LIQD Take 237 mLs by mouth 2 (two) times daily between meals. (Patient not taking: Reported on 10/16/2016) 237 mL 12  . ferrous sulfate 325 (65 FE) MG tablet Take 1 tablet (325 mg total) by mouth daily with breakfast. (Patient not taking: Reported on 10/16/2016) 30 tablet 0  . Insulin Pen Needle 31G X 5 MM MISC 1 each by Does not apply route at bedtime. (Patient not taking: Reported on 10/16/2016) 30 each 5  . Lancet Devices (ACCU-CHEK SOFTCLIX) lancets Use as instructed 3 times daily (Patient not taking: Reported on 10/16/2016) 1 each 5  . omeprazole (PRILOSEC) 20 MG capsule Take 1 capsule (20 mg total) by mouth daily. (Patient not taking: Reported on 10/16/2016) 30 capsule 3  . ACCU-CHEK SOFTCLIX LANCETS lancets Use 3 times daily as directed (Patient not taking: Reported on 10/16/2016) 100 each 12  . glucose blood (ACCU-CHEK AVIVA) test strip Use 3 times daily as directed. (Patient not taking: Reported on 10/16/2016) 100 each 5  . Insulin Detemir (LEVEMIR FLEXTOUCH) 100 UNIT/ML Pen Inject 10 Units into the skin daily at 10 pm. (Patient not taking: Reported on 10/16/2016) 15 mL 11   No facility-administered medications prior to visit.     ROS Review of Systems  Constitutional: Negative for activity change and appetite change.  HENT: Negative for sinus pressure and sore throat.   Eyes: Negative for visual disturbance.  Respiratory: Negative for cough, chest tightness and shortness of breath.   Cardiovascular: Negative for chest pain and leg swelling.  Gastrointestinal: Negative for abdominal distention, abdominal pain, constipation and diarrhea.  Endocrine: Negative.   Genitourinary: Negative for dysuria.  Musculoskeletal:       See hpi  Skin: Negative for rash.  Allergic/Immunologic: Negative.   Neurological: Negative for weakness, light-headedness and numbness.  Psychiatric/Behavioral: Negative for dysphoric mood and suicidal ideas.    Objective:  BP 94/60 (BP Location: Right Arm,  Patient Position: Sitting, Cuff Size: Small)   Pulse 96   Temp 97.7 F (36.5 C) (Oral)   Ht 5\' 5"  (1.651 m)   Wt 187 lb 6.4 oz (85 kg)   SpO2 98%   BMI 31.18 kg/m   BP/Weight 10/16/2016 10/07/2016 50/07/3817  Systolic BP 94 299 -  Diastolic BP 60 74 -  Wt. (Lbs) 187.4 - 172.4  BMI 31.18 - 28.69      Physical Exam  Constitutional: He is oriented to person, place, and time. He appears well-developed and well-nourished.  Cardiovascular: Normal rate, normal heart sounds and intact distal pulses.  An irregularly irregular rhythm present.  No murmur heard. Pulmonary/Chest: Effort normal and breath sounds normal. He has no wheezes. He has no rales. He exhibits no tenderness.  Abdominal: Soft. Bowel sounds are normal. He exhibits no distension and no mass. There is no tenderness.  Musculoskeletal: He exhibits tenderness (Mild tenderness on range of both ankles). He exhibits no edema (no edema both ankles).  Neurological: He is alert and oriented to person, place, and time.     Lab Results  Component Value Date   HGBA1C 7.1 10/16/2016    Assessment & Plan:   1. Essential hypertension Controlled - metoprolol (LOPRESSOR) 100 MG tablet; Take 1 tablet (100 mg total) by mouth 2 (two) times daily.  Dispense: 60 tablet; Refill: 5  2. Type 2 diabetes mellitus with complication, with long-term current use of insulin (HCC) Controlled with A1c of 7.1 I have emphasized compliance with Levemir - Glucose (CBG) - HgB A1c - Insulin Detemir (LEVEMIR FLEXTOUCH) 100 UNIT/ML Pen; Inject 10 Units into the skin daily at 10 pm.  Dispense: 15 mL; Refill: 11 - ACCU-CHEK SOFTCLIX LANCETS lancets; Use 3 times daily as directed  Dispense: 100 each; Refill: 12 - glucose blood (ACCU-CHEK AVIVA) test strip; Use 3 times daily as directed.  Dispense: 100 each; Refill: 5  3. Pain in joint involving right ankle and foot Secondary to gout flare - predniSONE (DELTASONE) 20 MG tablet; Take 2 tablets (40 mg  total) by mouth daily with breakfast.  Dispense: 10 tablet; Refill: 0  4. Chronic systolic CHF (congestive heart failure) (HCC) EF of 40% to 45% from 2-D echo of 01/2016 No evidence of fluid overload Continue Lasix ACE inhibitor on hold due to chronic kidney disease and subsequent blood pressure  5. Chronic atrial fibrillation with RVR (HCC) Rate control with metoprolol, anticoagulation with Coumadin Patient to call the Coumadin clinic to set up a follow-up appointment.  6. Acute idiopathic gout of ankle, unspecified laterality He has had multiple, flares of recent Strongly advised against consumption of foods thatTrigger gout  7. Chronic kidney disease Avoid nephrotoxins Follow up with [facility] ordered this encounter  Medications  . metoprolol (LOPRESSOR) 100 MG tablet    Sig: Take 1 tablet (100 mg total) by mouth 2 (two) times daily.    Dispense:  60 tablet    Refill:  5  . Insulin Detemir (LEVEMIR FLEXTOUCH) 100 UNIT/ML Pen    Sig: Inject 10 Units into the skin daily at 10 pm.    Dispense:  15 mL    Refill:  11    Please discontinue Lantus 15 units.  . predniSONE (DELTASONE) 20 MG tablet    Sig: Take 2 tablets (40 mg total) by mouth daily with breakfast.    Dispense:  10 tablet    Refill:  0  . ACCU-CHEK SOFTCLIX LANCETS lancets    Sig: Use 3 times daily as directed    Dispense:  100 each    Refill:  12    E11.9  . glucose blood (ACCU-CHEK AVIVA) test strip    Sig: Use 3 times daily as directed.    Dispense:  100 each    Refill:  5    E11.9 diagnosis code    Follow-up: Return in about 2 weeks (around 10/30/2016) for TCC ; follow up of gout flare.   [name] MD

## 2016-10-21 MED FILL — ATORVASTATIN 20 MG TABLET: 20 | 30 days supply | Qty: 30 | Fill #3

## 2016-10-21 MED FILL — FOLIC ACID 1 MG TABLET: 1 | 30 days supply | Qty: 30 | Fill #3

## 2016-10-21 MED FILL — traMADol HCL 50 MG TABS: 50 | 30 days supply | Qty: 60 | Fill #1

## 2016-10-21 MED FILL — ?ALLOPURINOL 300 MG TABLET: 300 MG | 30 days supply | Qty: 30 | Fill #1

## 2016-10-21 MED FILL — OMEPRAZOLE DR 20 MG CAPSULE: 20 | 30 days supply | Qty: 30 | Fill #3

## 2016-10-23 ENCOUNTER — Telehealth: Payer: Self-pay

## 2016-10-23 NOTE — Telephone Encounter (Signed)
This Case Manager placed call to patient to check on status. Patient indicated he was doing well and denied any problems or health concerns. Informed patient that he needs to schedule an appointment with Coumadin Clinic as it appears he has missed several appointments. Patient denied bleeding. Inquired if patient had the contact number to the Coumadin Clinic. Patient indicated he had contact information, and once again stressed importance of Coumadin Clinic follow-up. Patient verbalized understanding.   Discussed importance of medication compliance. Patient indicated he was being compliant with blood glucose monitoring and indicated he was taking Levemir 10 Units at prescribed. Reminded patient of follow-up appointment on 11/03/16 at 1130 with Dr. Jarold Song. Patient verbalized understanding. Also, reminded patient that Dr. Jarold Song wanted him to follow-up with Cardiologist because he was missing digoxin from his medications. Patient verbalized understanding and indicated he would call Cardiologist. Also informed him that Dr. Jarold Song wants him to follow-up with [facility]. Patient indicated he had an appointment scheduled but was uncertain of date as he was not currently at home. No additional needs/concerns identified.

## 2016-10-28 ENCOUNTER — Telehealth: Payer: Self-pay

## 2016-10-28 NOTE — Telephone Encounter (Signed)
Call placed to the patient to check on his status. He said that he is doing " okay" and confirmed that he is taking all of his medications as ordered. He denied any signs/symptoms of bleeding. He noted that his blood sugars have ben " okay" and stated that his blood sugar this morning was 160.   He stated that he did not make an appointment at the coumadin clinic for INR check yet and he needed the number for the clinic.  This CM inquired if he made his appointment at [facility] and he stated that he also needed the number for that clinic.  This CM provided him with the phone numbers to both clinics and asked him if his daughter would help him make the appointments or if he needed an interpreter. He said that he didn't need the interpreter and he would make the appointments by himself.   Coumadin Clinic # [phone] [facility] # [phone].  The patient repeated both numbers back correctly.   No other problems/questions reported at this time. He confirmed his appointment at the [facility] on 11/03/16.

## 2016-10-31 ENCOUNTER — Telehealth: Payer: Self-pay

## 2016-10-31 NOTE — Telephone Encounter (Signed)
This Case Manager placed call to patient to patient to remind him of upcoming Transitional Care follow-up appointment on 11/03/16 at 1130 with Dr. Jarold Song. Patient aware of appointment and indicated he had transportation to his appointment. Inquired about patient's status. Patient denied any health concerns or bleeding. Stressed with patient the importance of getting INR checked, and patient indicated he planned to call the Coumadin Clinic today and schedule an appointment for 11/03/16. Reminded patient to bring all medications with him to upcoming Transitional Care follow-up appointment, and patient verbalized understanding. No additional needs/concerns identified.

## 2016-11-03 ENCOUNTER — Telehealth: Payer: Self-pay

## 2016-11-03 ENCOUNTER — Ambulatory Visit (INDEPENDENT_AMBULATORY_CARE_PROVIDER_SITE_OTHER): Payer: Medicare Other | Admitting: *Deleted

## 2016-11-03 ENCOUNTER — Ambulatory Visit: Payer: Medicare Other | Attending: Family Medicine | Admitting: Family Medicine

## 2016-11-03 ENCOUNTER — Encounter: Payer: Self-pay | Admitting: Family Medicine

## 2016-11-03 VITALS — BP 117/79 | HR 93 | Temp 97.6°F | Ht 65.0 in | Wt 174.4 lb

## 2016-11-03 DIAGNOSIS — Z7901 Long term (current) use of anticoagulants: Secondary | ICD-10-CM | POA: Diagnosis not present

## 2016-11-03 DIAGNOSIS — M10079 Idiopathic gout, unspecified ankle and foot: Secondary | ICD-10-CM | POA: Diagnosis not present

## 2016-11-03 DIAGNOSIS — I130 Hypertensive heart and chronic kidney disease with heart failure and stage 1 through stage 4 chronic kidney disease, or unspecified chronic kidney disease: Secondary | ICD-10-CM | POA: Diagnosis not present

## 2016-11-03 DIAGNOSIS — Z5181 Encounter for therapeutic drug level monitoring: Secondary | ICD-10-CM | POA: Diagnosis not present

## 2016-11-03 DIAGNOSIS — M10071 Idiopathic gout, right ankle and foot: Secondary | ICD-10-CM | POA: Insufficient documentation

## 2016-11-03 DIAGNOSIS — Z952 Presence of prosthetic heart valve: Secondary | ICD-10-CM | POA: Insufficient documentation

## 2016-11-03 DIAGNOSIS — I079 Rheumatic tricuspid valve disease, unspecified: Secondary | ICD-10-CM | POA: Diagnosis not present

## 2016-11-03 DIAGNOSIS — E785 Hyperlipidemia, unspecified: Secondary | ICD-10-CM | POA: Diagnosis not present

## 2016-11-03 DIAGNOSIS — I5022 Chronic systolic (congestive) heart failure: Secondary | ICD-10-CM | POA: Diagnosis not present

## 2016-11-03 DIAGNOSIS — N184 Chronic kidney disease, stage 4 (severe): Secondary | ICD-10-CM | POA: Insufficient documentation

## 2016-11-03 DIAGNOSIS — Z794 Long term (current) use of insulin: Secondary | ICD-10-CM | POA: Insufficient documentation

## 2016-11-03 DIAGNOSIS — I482 Chronic atrial fibrillation, unspecified: Secondary | ICD-10-CM

## 2016-11-03 DIAGNOSIS — I059 Rheumatic mitral valve disease, unspecified: Secondary | ICD-10-CM

## 2016-11-03 DIAGNOSIS — E1122 Type 2 diabetes mellitus with diabetic chronic kidney disease: Secondary | ICD-10-CM | POA: Insufficient documentation

## 2016-11-03 DIAGNOSIS — I10 Essential (primary) hypertension: Secondary | ICD-10-CM

## 2016-11-03 LAB — POCT INR: INR: 2

## 2016-11-03 MED ORDER — COLCHICINE 0.6 MG PO TABS: ORAL_TABLET | ORAL | 1 refills | Status: DC

## 2016-11-03 MED FILL — COLCHICINE 0.6 MG TABLET: 0.6 | 10 days supply | Qty: 30 | Fill #0

## 2016-11-03 NOTE — Patient Instructions (Signed)

## 2016-11-03 NOTE — Telephone Encounter (Signed)
Call received from [facility] at [facility]. She stated that the patient has a 4 month follow up appointment scheduled for 12/03/16 @ 1200. She confirmed the address that is on file in Epic and stated that they would send a reminder card.   Call placed to the patient and notified him of the appointment.  He was very appreciative of the call.

## 2016-11-03 NOTE — Progress Notes (Signed)
Subjective:    Patient ID: Michael [name], male    DOB: 02/05/1961, 55 y.o.   MRN: [id number]  HPI Michael [name]  is a 55 with a history of Type 2DM (A1c 7.1), hypertension, hyperlipidemia, mitral valve replacement with porcine valve, tricuspid valve repair, atrial fibrillation (on anticoagulation with Coumadin),Chronic systolic heartfailure  (EF 40 - 45% from 01/2016), CKD stage IV, Gout who comes into the clinic for follow-up visit.  He was seen at his last visit for acute gout of his left foot and had received Prednisone which he completed but states his foot still hurts but not as bad. He admits to eating a lot of sea food; has been compliant with his Allopurinol. Denies fever.  His INR is managed by the Coumadin clinic and he has an appointment this afternoon.   Past Medical History:  Diagnosis Date  . Anemia of chronic disease   . Asthma   . Chronic kidney disease (CKD), stage IV (severe) ([place])   . Chronic systolic CHF (congestive heart failure) (HCC)    a. prior EF normal; b. Echo 10/16: Mild LVH, EF 30-35%, mitral valve bioprosthesis present without evidence of stenosis or regurgitation, severe LAE, mild RVE with mild to moderately reduced RVSF, moderate RAE  . Coronary artery disease   . Diabetes mellitus type 2 in obese ([place])   . Diabetes mellitus without complication ([place])   . GERD (gastroesophageal reflux disease)   . H/O tricuspid valve repair 2012  . History of echocardiogram    Echo at [facility] in [facility] 4/12: mod LVH, EF 60-65%, mod LAE, tissue MVR ok, mod TR, mod pulmo HTN with RVSP 48 mmHg  . Hypertension   . Obstructive sleep apnea   . Paroxysmal atrial fibrillation (HCC)    s/p Maze procedure at time of MVR in 2011  . Pulmonary HTN   . 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 CARDIAC SURGERY      No Known Allergies   Review of Systems Constitutional: Negative for activity  change and appetite change.  HENT: Negative for sinus pressure and sore throat.   Eyes: Negative for visual disturbance.  Respiratory: Negative for cough, chest tightness and shortness of breath.   Cardiovascular: Negative for chest pain and leg swelling.  Gastrointestinal: Negative for abdominal distention, abdominal pain, constipation and diarrhea.  Endocrine: Negative.   Genitourinary: Negative for dysuria.  Musculoskeletal:       See hpi  Skin: Negative for rash.  Allergic/Immunologic: Negative.   Neurological: Negative for weakness, light-headedness and numbness.  Psychiatric/Behavioral: Negative for dysphoric mood and suicidal ideas.     Objective: Vitals:   11/03/16 1050  BP: 117/79  Pulse: 93  Temp: 97.6 F (36.4 C)  TempSrc: Oral  SpO2: 100%  Weight: 174 lb 6.4 oz (79.1 kg)  Height: 5\' 5"  (1.651 m)      Physical Exam  Constitutional: He is oriented to person, place, and time. He appears well-developed and well-nourished.  Cardiovascular: Normal rate, normal heart sounds and intact distal pulses.  An irregularly irregular rhythm present.  No murmur heard. Pulmonary/Chest: Effort normal and breath sounds normal. He has no wheezes. He has no rales. He exhibits no tenderness.  Abdominal: Soft. Bowel sounds are normal. He exhibits no distension and no mass. There is no tenderness.  Musculoskeletal: Normal range of motion. He exhibits tenderness (tenderness on palpation of left foot). He exhibits no edema.  Neurological:  He is alert and oriented to person, place, and time.          Assessment & Plan:  1. Essential hypertension Controlled - metoprolol (LOPRESSOR) 100 MG tablet; Take 1 tablet (100 mg total) by mouth 2 (two) times daily.  Dispense: 60 tablet; Refill: 5  2. Type 2 diabetes mellitus with complication, with long-term current use of insulin (HCC) Controlled with A1c of 7.1 I have emphasized compliance with Levemir - Glucose (CBG) - HgB A1c - Insulin  Detemir (LEVEMIR FLEXTOUCH) 100 UNIT/ML Pen; Inject 10 Units into the skin daily at 10 pm.  Dispense: 15 mL; Refill: 11 - ACCU-CHEK SOFTCLIX LANCETS lancets; Use 3 times daily as directed  Dispense: 100 each; Refill: 12 - glucose blood (ACCU-CHEK AVIVA) test strip; Use 3 times daily as directed.  Dispense: 100 each; Refill: 5  3. Pain in joint involving right ankle and foot Secondary to gout flare Recurrent flares Avoid foods that trigger Gout - predniSONE (DELTASONE) 20 MG tablet; Take 2 tablets (40 mg total) by mouth daily with breakfast.  Dispense: 10 tablet; Refill: 0  4. Chronic systolic CHF (congestive heart failure) (HCC) EF of 40% to 45% from 2-D echo of 01/2016 No evidence of fluid overload Continue Lasix ACE inhibitor on hold due to chronic kidney disease and subsequent blood pressure  5. Chronic atrial fibrillation with RVR (HCC) Rate control with metoprolol, anticoagulation with Coumadin Patient has an appointment at the coumadin clinic today  6. Acute idiopathic gout of ankle, unspecified laterality He has had multiple, flares of recent Strongly advised against consumption of foods thatTrigger gout  7. Chronic kidney disease Avoid nephrotoxins Follow up with [facility]

## 2016-11-03 NOTE — Telephone Encounter (Signed)
Met with the patient when he was at the clinic today to see Dr Jarold Song. He has an appointment scheduled at the coumadin clinic today at 1315. He asked this CM to schedule an appointment for him at [facility]. Call placed to # [phone] and spoke to [facility]. She noted that the patient sees Dr Joelyn Oms and she transferred the call to scheduler, Jeannene Patella. Voicemail message was left for [facility] requesting a follow up appointment for the patient.  call back requested to  to # [phone] or [phone]  Informed the patient that this CM would contact him when an appointment has been scheduled.  He was very appreciative of the assistance.

## 2016-11-24 ENCOUNTER — Ambulatory Visit (INDEPENDENT_AMBULATORY_CARE_PROVIDER_SITE_OTHER): Payer: Medicare Other | Admitting: *Deleted

## 2016-11-24 ENCOUNTER — Other Ambulatory Visit: Payer: Self-pay | Admitting: Family Medicine

## 2016-11-24 DIAGNOSIS — Z5181 Encounter for therapeutic drug level monitoring: Secondary | ICD-10-CM | POA: Diagnosis not present

## 2016-11-24 DIAGNOSIS — I079 Rheumatic tricuspid valve disease, unspecified: Secondary | ICD-10-CM | POA: Diagnosis not present

## 2016-11-24 DIAGNOSIS — E118 Type 2 diabetes mellitus with unspecified complications: Secondary | ICD-10-CM

## 2016-11-24 DIAGNOSIS — I059 Rheumatic mitral valve disease, unspecified: Secondary | ICD-10-CM | POA: Diagnosis not present

## 2016-11-24 DIAGNOSIS — Z794 Long term (current) use of insulin: Secondary | ICD-10-CM

## 2016-11-24 DIAGNOSIS — K219 Gastro-esophageal reflux disease without esophagitis: Secondary | ICD-10-CM

## 2016-11-24 LAB — POCT INR: INR: 1.8

## 2016-11-24 MED FILL — ?ALLOPURINOL 300 MG TABLET: 300 MG | 30 days supply | Qty: 30 | Fill #2

## 2016-11-24 MED FILL — COLCHICINE 0.6 MG TABLET: 0.6 | 10 days supply | Qty: 30 | Fill #1

## 2016-11-24 MED FILL — METOPROLOL TARTRATE 100 MG: 100 | 30 days supply | Qty: 60 | Fill #1

## 2016-11-24 MED FILL — POTASSIUM CL ER 20 MEQ TAB: 20 | 30 days supply | Qty: 30 | Fill #6

## 2016-11-25 ENCOUNTER — Telehealth: Payer: Self-pay | Admitting: Family Medicine

## 2016-11-25 NOTE — Telephone Encounter (Signed)
Patient called requesting appointment for diarrhea over past 2 days. Offered appointment in walk-in clinic due to PCP having no available appts and patient declined appointment stating that he will call back if he does not begin to feel better.

## 2016-11-26 ENCOUNTER — Other Ambulatory Visit: Payer: Self-pay | Admitting: *Deleted

## 2016-11-26 MED ORDER — WARFARIN SODIUM 2.5 MG PO TABS: ORAL_TABLET | ORAL | 2 refills | Status: DC

## 2016-11-27 ENCOUNTER — Telehealth: Payer: Self-pay

## 2016-11-27 NOTE — Telephone Encounter (Signed)
This Case Manager placed call to patient to check status. Patient indicated indicated he was doing well and denied any health problems or concerns. Patient informed he needs a follow-up appointment with Dr. Jarold Song. Patient agreeable. Reminded patient of his upcoming appointment with [facility] on 12/03/16 1200. Patient indicated he would like an appointment on 12/03/16 if possible. Appointment scheduled for 12/03/16 at 0945 with Dr. Jarold Song. Patient appreciative of appointment. Inquired if patient taking his medications as prescribed, and patient indicated he was taking his medications as prescribed. Patient indicated he needed a refill of Coumadin. Medication list checked and informed patient that medication was refilled yesterday. Patient appreciative of information. Reminded patient of his upcoming Coumadin Clinic appointment 12/08/16 at 1415. No additional needs identified.

## 2016-12-01 ENCOUNTER — Ambulatory Visit: Payer: Medicare Other | Admitting: Family Medicine

## 2016-12-03 ENCOUNTER — Ambulatory Visit: Payer: Medicare Other | Admitting: Family Medicine

## 2016-12-12 ENCOUNTER — Encounter: Payer: Self-pay | Admitting: Family Medicine

## 2016-12-12 ENCOUNTER — Ambulatory Visit: Payer: Medicare Other | Attending: Family Medicine | Admitting: Family Medicine

## 2016-12-12 VITALS — BP 94/64 | HR 96 | Temp 97.7°F | Ht 64.0 in | Wt 163.2 lb

## 2016-12-12 DIAGNOSIS — Z7901 Long term (current) use of anticoagulants: Secondary | ICD-10-CM | POA: Diagnosis not present

## 2016-12-12 DIAGNOSIS — E118 Type 2 diabetes mellitus with unspecified complications: Secondary | ICD-10-CM | POA: Diagnosis not present

## 2016-12-12 DIAGNOSIS — M25561 Pain in right knee: Secondary | ICD-10-CM | POA: Diagnosis not present

## 2016-12-12 DIAGNOSIS — N184 Chronic kidney disease, stage 4 (severe): Secondary | ICD-10-CM | POA: Insufficient documentation

## 2016-12-12 DIAGNOSIS — I5022 Chronic systolic (congestive) heart failure: Secondary | ICD-10-CM | POA: Insufficient documentation

## 2016-12-12 DIAGNOSIS — K219 Gastro-esophageal reflux disease without esophagitis: Secondary | ICD-10-CM | POA: Diagnosis not present

## 2016-12-12 DIAGNOSIS — I4891 Unspecified atrial fibrillation: Secondary | ICD-10-CM | POA: Insufficient documentation

## 2016-12-12 DIAGNOSIS — E785 Hyperlipidemia, unspecified: Secondary | ICD-10-CM | POA: Insufficient documentation

## 2016-12-12 DIAGNOSIS — Z794 Long term (current) use of insulin: Secondary | ICD-10-CM | POA: Insufficient documentation

## 2016-12-12 DIAGNOSIS — I130 Hypertensive heart and chronic kidney disease with heart failure and stage 1 through stage 4 chronic kidney disease, or unspecified chronic kidney disease: Secondary | ICD-10-CM | POA: Insufficient documentation

## 2016-12-12 DIAGNOSIS — E1122 Type 2 diabetes mellitus with diabetic chronic kidney disease: Secondary | ICD-10-CM | POA: Diagnosis not present

## 2016-12-12 DIAGNOSIS — Z79899 Other long term (current) drug therapy: Secondary | ICD-10-CM | POA: Diagnosis not present

## 2016-12-12 DIAGNOSIS — M10079 Idiopathic gout, unspecified ankle and foot: Secondary | ICD-10-CM | POA: Insufficient documentation

## 2016-12-12 MED ORDER — FOLIC ACID 1 MG PO TABS: 1.0000 mg | ORAL_TABLET | Freq: Every day | ORAL | 2 refills | Status: DC

## 2016-12-12 MED ORDER — TRAMADOL HCL 50 MG PO TABS: 50.0000 mg | ORAL_TABLET | Freq: Two times a day (BID) | ORAL | 2 refills | Status: DC | PRN

## 2016-12-12 MED ORDER — COLCHICINE 0.6 MG PO TABS: ORAL_TABLET | ORAL | 3 refills | Status: DC

## 2016-12-12 MED ORDER — DICLOFENAC SODIUM 1 % TD GEL: 4.0000 g | Freq: Four times a day (QID) | TRANSDERMAL | 2 refills | Status: DC

## 2016-12-12 MED ORDER — FUROSEMIDE 80 MG PO TABS: 80.0000 mg | ORAL_TABLET | Freq: Two times a day (BID) | ORAL | 3 refills | Status: DC

## 2016-12-12 MED ORDER — PREDNISONE 20 MG PO TABS: 40.0000 mg | ORAL_TABLET | Freq: Every day | ORAL | 0 refills | Status: DC

## 2016-12-12 MED ORDER — OMEPRAZOLE 20 MG PO CPDR: 20.0000 mg | DELAYED_RELEASE_CAPSULE | Freq: Every day | ORAL | 3 refills | Status: DC

## 2016-12-12 MED FILL — FOLIC ACID 1 MG TABLET: 1 | 30 days supply | Qty: 30 | Fill #0

## 2016-12-12 MED FILL — predniSONE 20 MG TABS: 20 | 5 days supply | Qty: 10 | Fill #0

## 2016-12-12 MED FILL — OMEPRAZOLE DR 20 MG CAPSULE: 20 | 30 days supply | Qty: 30 | Fill #0

## 2016-12-12 MED FILL — DICLOFENAC SODIUM 1% GEL: 1 | 30 days supply | Qty: 100 | Fill #0

## 2016-12-12 MED FILL — COLCHICINE 0.6 MG TABLET: 0.6 | 30 days supply | Qty: 30 | Fill #0

## 2016-12-12 MED FILL — traMADol HCL 50 MG TABS: 50 | 30 days supply | Qty: 60 | Fill #0

## 2016-12-12 MED FILL — FUROSEMIDE 80 MG TABLET: 80 | 30 days supply | Qty: 60 | Fill #0

## 2016-12-12 NOTE — Progress Notes (Signed)
Medication refill- tramadol, omeprazole, lasix, folic acid, atorvastatin

## 2016-12-12 NOTE — Progress Notes (Signed)
This Case Manager met with patient while in clinic for office visit with Dr. Jarold Song. 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 Patient indicated he has been unable to check blood glucose as he has been told that his diabetes test strips are not covered by his insurance. Patient has Medicare Part A and B. He indicated he uses [employer] [phone], [phone]) for diabetes testing supplies.  This Case Manager placed call to [employer] and spoke with [employer]. Explained that patient needing diabetes test strips and informed her of patient's insurance coverage. She indicated patient's Medicare number needed. Medicare number provided, and she indicated patient able to pick up his diabetes test strips later today. She indicated his test strips will cost $16.64, and the cost of his test strips should be cheaper when patient meets his medication deductible. Patient updated on all above information, and patient appreciative of assistance. No additional needs identified.

## 2016-12-12 NOTE — Progress Notes (Signed)
Subjective:  Patient ID: Michael [name], male    DOB: 1961/01/30  Age: 56 y.o. MRN: [id number]  CC: Congestive Heart Failure; Diabetes; Hypertension; and Gout   HPI Michael [name] is a 26 with a history of Type 2DM (A1c 7.1), hypertension, hyperlipidemia, mitral valve replacement with porcine valve, tricuspid valve repair, atrial fibrillation (on anticoagulation with Coumadin),Chronic systolic heartfailure  (EF 40 - 45% from 01/2016), CKD stage IV, Gout who comes into the clinic for follow-up visit.  He was seen at his last visit for acute gout of his left foot and had received Prednisone which he completed but states his foot still hurts but not as bad. Denies fever.  Today he complains of pain in his right knee which is up 7-8/10 but denies swelling; he also had some mild pain in his toes. On further questioning he endorses eating a lot of red meat and sea food and review of his medication indicates he has been out of colchicine but has been compliant with allopurinol.  He has missed 2 appointments the cardiology clinic and is planning to call to reschedule. Denies chest pain or shortness of breath or pedal edema.  His INR is managed by the Coumadin clinic    Past Medical History:  Diagnosis Date  . Anemia of chronic disease   . Asthma   . Chronic kidney disease (CKD), stage IV (severe) ([place])   . Chronic systolic CHF (congestive heart failure) (HCC)    a. prior EF normal; b. Echo 10/16: Mild LVH, EF 30-35%, mitral valve bioprosthesis present without evidence of stenosis or regurgitation, severe LAE, mild RVE with mild to moderately reduced RVSF, moderate RAE  . Coronary artery disease   . Diabetes mellitus type 2 in obese ([place])   . Diabetes mellitus without complication ([place])   . GERD (gastroesophageal reflux disease)   . H/O tricuspid valve repair 2012  . History of echocardiogram    Echo at [facility] in [facility] 4/12: mod LVH, EF 60-65%, mod LAE, tissue MVR ok, mod TR, mod  pulmo HTN with RVSP 48 mmHg  . Hypertension   . Obstructive sleep apnea   . Paroxysmal atrial fibrillation (HCC)    s/p Maze procedure at time of MVR in 2011  . Pulmonary HTN   . 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 CARDIAC SURGERY      No Known Allergies   Outpatient Medications Prior to Visit  Medication Sig Dispense Refill  . allopurinol (ZYLOPRIM) 300 MG tablet Take 1 tablet (300 mg total) by mouth daily. 30 tablet 6  . atorvastatin (LIPITOR) 20 MG tablet Take 1 tablet (20 mg total) by mouth daily. 30 tablet 3  . Insulin Detemir (LEVEMIR FLEXTOUCH) 100 UNIT/ML Pen Inject 10 Units into the skin daily at 10 pm. 15 mL 11  . Insulin Pen Needle 31G X 5 MM MISC 1 each by Does not apply route at bedtime. 30 each 5  . metoprolol (LOPRESSOR) 100 MG tablet Take 1 tablet (100 mg total) by mouth 2 (two) times daily. 60 tablet 5  . warfarin (COUMADIN) 2.5 MG tablet Take 1-2 tablets by mouth daily as directed by coumadin clinic 45 tablet 2  . furosemide (LASIX) 80 MG tablet Take 1 tablet (80 mg total) by mouth 2 (two) times daily. 60 tablet 3  . omeprazole (PRILOSEC) 20 MG capsule Take 1 capsule (20 mg total) by mouth daily. 30 capsule 3  .  traMADol (ULTRAM) 50 MG tablet Take 50 mg by mouth every 6 (six) hours as needed.    Marland Kitchen ACCU-CHEK SOFTCLIX LANCETS lancets Use 3 times daily as directed (Patient not taking: Reported on 12/12/2016) 100 each 12  . acetaminophen (TYLENOL) 500 MG tablet Take 1 tablet (500 mg total) by mouth every 6 (six) hours as needed. (Patient not taking: Reported on 12/12/2016) 30 tablet 0  . Blood Glucose Monitoring Suppl (ACCU-CHEK AVIVA) device Use as Instructed 3 times daily (Patient not taking: Reported on 12/12/2016) 1 each 0  . digoxin (LANOXIN) 0.125 MG tablet Take 1 tablet (0.125 mg total) by mouth daily. (Patient not taking: Reported on 11/03/2016) 30 tablet 11  . feeding supplement, ENSURE  ENLIVE, (ENSURE ENLIVE) LIQD Take 237 mLs by mouth 2 (two) times daily between meals. (Patient not taking: Reported on 11/03/2016) 237 mL 12  . ferrous sulfate 325 (65 FE) MG tablet Take 1 tablet (325 mg total) by mouth daily with breakfast. (Patient not taking: Reported on 11/03/2016) 30 tablet 0  . glucose blood (ACCU-CHEK AVIVA) test strip Use 3 times daily as directed. (Patient not taking: Reported on 12/12/2016) 100 each 5  . Lancet Devices (ACCU-CHEK SOFTCLIX) lancets Use as instructed 3 times daily (Patient not taking: Reported on 12/12/2016) 1 each 5  . colchicine 0.6 MG tablet Take 2 tablets (1.2 mg) at the onset of a gout attack, may repeat 1 tablet (0.6 mg) if symptoms persist. (Patient not taking: Reported on 12/12/2016) 30 tablet 1  . diclofenac sodium (VOLTAREN) 1 % GEL Apply 4 g topically 4 (four) times daily. (Patient not taking: Reported on 6/96/2952) 841 g 0  . folic acid (FOLVITE) 1 MG tablet Take 1 mg by mouth daily.    . predniSONE (DELTASONE) 20 MG tablet Take 2 tablets (40 mg total) by mouth daily with breakfast. (Patient not taking: Reported on 11/03/2016) 10 tablet 0   No facility-administered medications prior to visit.     ROS Review of Systems Constitutional: Negative for activity change and appetite change.  HENT: Negative for sinus pressure and sore throat.   Eyes: Negative for visual disturbance.  Respiratory: Negative for cough, chest tightness and shortness of breath.   Cardiovascular: Negative for chest pain and leg swelling.  Gastrointestinal: Negative for abdominal distention, abdominal pain, constipation and diarrhea.  Endocrine: Negative.   Genitourinary: Negative for dysuria.  Musculoskeletal:       See hpi  Skin: Negative for rash.  Allergic/Immunologic: Negative.   Neurological: Negative for weakness, light-headedness and numbness.  Psychiatric/Behavioral: Negative for dysphoric mood and suicidal ideas.  Objective:  BP 94/64 (BP Location: Right Arm,  Patient Position: Sitting, Cuff Size: Large)   Pulse 96   Temp 97.7 F (36.5 C) (Oral)   Ht 5\' 4"  (1.626 m)   Wt 163 lb 3.2 oz (74 kg)   SpO2 97%   BMI 28.01 kg/m   BP/Weight 12/12/2016 11/03/2016 32/44/0102  Systolic BP 94 725 94  Diastolic BP 64 79 60  Wt. (Lbs) 163.2 174.4 187.4  BMI 28.01 29.02 31.18      Physical Exam Constitutional: He is oriented to person, place, and time. He appears well-developed and well-nourished.  Neck : no JVD Cardiovascular: Normal rate, normal heart sounds and intact distal pulses.  An irregularly irregular rhythm present.  No murmur heard. Pulmonary/Chest: Effort normal and breath sounds normal. He has no wheezes. He has no rales. He exhibits no tenderness.  Abdominal: Soft. Bowel sounds are normal. He exhibits no  distension and no mass. There is no tenderness.  Musculoskeletal: Normal range of motion of Both knees with mild crepitus in right knee on flexion and extension. No edema or erythema.  Neurological: He is alert and oriented to person, place, and time.  Psych: normal   Lab Results  Component Value Date   HGBA1C 7.1 10/16/2016    CMP Latest Ref Rng & Units 10/06/2016 10/05/2016 06/02/2016  Glucose 65 - 99 mg/dL 99 148(H) 138(H)  BUN 6 - 20 mg/dL 40(H) 35(H) 56(H)  Creatinine 0.61 - 1.24 mg/dL 2.36(H) 2.35(H) 2.16(H)  Sodium 135 - 145 mmol/L 135 135 135  Potassium 3.5 - 5.1 mmol/L 3.9 3.9 4.2  Chloride 101 - 111 mmol/L 99(L) 97(L) 110  CO2 22 - 32 mmol/L 27 27 20(L)  Calcium 8.9 - 10.3 mg/dL 8.9 9.1 8.7(L)  Total Protein 6.5 - 8.1 g/dL - 8.2(H) -  Total Bilirubin 0.3 - 1.2 mg/dL - 0.7 -  Alkaline Phos 38 - 126 U/L - 129(H) -  AST 15 - 41 U/L - 24 -  ALT 17 - 63 U/L - 7(L) -     Assessment & Plan:   1. Gastroesophageal reflux disease without esophagitis Stable - omeprazole (PRILOSEC) 20 MG capsule; Take 1 capsule (20 mg total) by mouth daily.  Dispense: 30 capsule; Refill: 3  2. Chronic systolic CHF (congestive heart  failure) (HCC) Ejection fraction 40-45% from 2-D echo of 01/2016 Euvolemic Weight is stable - potassium chloride SA (K-DUR,KLOR-CON) 20 MEQ tablet; Take 20 mEq by mouth 2 (two) times daily. - furosemide (LASIX) 80 MG tablet; Take 1 tablet (80 mg total) by mouth 2 (two) times daily.  Dispense: 60 tablet; Refill: 3  3. Type 2 diabetes mellitus with complication, with long-term current use of insulin (HCC) Controlled with A1c of 7.1  4. Acute pain of right knee Could be underlying osteoarthritis Unable to place on oral NSAIDs due to increased risk of bleeding when combined with Coumadin - traMADol (ULTRAM) 50 MG tablet; Take 1 tablet (50 mg total) by mouth every 12 (twelve) hours as needed.  Dispense: 60 tablet; Refill: 2 - diclofenac sodium (VOLTAREN) 1 % GEL; Apply 4 g topically 4 (four) times daily.  Dispense: 100 g; Refill: 2  5. CKD (chronic kidney disease), stage IV ([place]) Avoid nephrotoxins Closely followed by [facility]  6. Atrial fibrillation with RVR (HCC) INR 1.8 Keep appointment with the Coumadin clinic  7. Acute idiopathic gout of ankle, unspecified laterality Avoid foods that trigger gout - predniSONE (DELTASONE) 20 MG tablet; Take 2 tablets (40 mg total) by mouth daily with breakfast.  Dispense: 10 tablet; Refill: 0 - colchicine 0.6 MG tablet; Take 2 tablets (1.2 mg) at the onset of a gout attack, may repeat 1 tablet (0.6 mg) if symptoms persist.  Dispense: 30 tablet; Refill: 3   Meds ordered this encounter  Medications  . predniSONE (DELTASONE) 20 MG tablet    Sig: Take 2 tablets (40 mg total) by mouth daily with breakfast.    Dispense:  10 tablet    Refill:  0  . omeprazole (PRILOSEC) 20 MG capsule    Sig: Take 1 capsule (20 mg total) by mouth daily.    Dispense:  30 capsule    Refill:  3  . folic acid (FOLVITE) 1 MG tablet    Sig: Take 1 tablet (1 mg total) by mouth daily.    Dispense:  30 tablet    Refill:  2  . traMADol (ULTRAM)  50 MG tablet      Sig: Take 1 tablet (50 mg total) by mouth every 12 (twelve) hours as needed.    Dispense:  60 tablet    Refill:  2  . colchicine 0.6 MG tablet    Sig: Take 2 tablets (1.2 mg) at the onset of a gout attack, may repeat 1 tablet (0.6 mg) if symptoms persist.    Dispense:  30 tablet    Refill:  3  . furosemide (LASIX) 80 MG tablet    Sig: Take 1 tablet (80 mg total) by mouth 2 (two) times daily.    Dispense:  60 tablet    Refill:  3  . diclofenac sodium (VOLTAREN) 1 % GEL    Sig: Apply 4 g topically 4 (four) times daily.    Dispense:  100 g    Refill:  2    Follow-up: Return in about 3 months (around 03/12/2017) for Follow-up chronic medical conditions.   [name] MD

## 2017-01-05 MED FILL — COLCRYS 0.6 MG TABLET: 0.6 | 30 days supply | Qty: 30 | Fill #1

## 2017-01-05 MED FILL — POTASSIUM CL ER 20 MEQ TAB: 20 | 30 days supply | Qty: 30 | Fill #7

## 2017-01-05 MED FILL — METOPROLOL TARTRATE 100 MG: 100 | 30 days supply | Qty: 60 | Fill #2

## 2017-01-13 ENCOUNTER — Ambulatory Visit (INDEPENDENT_AMBULATORY_CARE_PROVIDER_SITE_OTHER): Payer: Medicare Other | Admitting: *Deleted

## 2017-01-13 DIAGNOSIS — I059 Rheumatic mitral valve disease, unspecified: Secondary | ICD-10-CM

## 2017-01-13 DIAGNOSIS — I4891 Unspecified atrial fibrillation: Secondary | ICD-10-CM

## 2017-01-13 DIAGNOSIS — I079 Rheumatic tricuspid valve disease, unspecified: Secondary | ICD-10-CM | POA: Diagnosis not present

## 2017-01-13 DIAGNOSIS — I10 Essential (primary) hypertension: Secondary | ICD-10-CM | POA: Diagnosis not present

## 2017-01-13 DIAGNOSIS — D631 Anemia in chronic kidney disease: Secondary | ICD-10-CM | POA: Diagnosis not present

## 2017-01-13 DIAGNOSIS — R634 Abnormal weight loss: Secondary | ICD-10-CM | POA: Diagnosis not present

## 2017-01-13 DIAGNOSIS — Z5181 Encounter for therapeutic drug level monitoring: Secondary | ICD-10-CM | POA: Diagnosis not present

## 2017-01-13 DIAGNOSIS — I509 Heart failure, unspecified: Secondary | ICD-10-CM | POA: Diagnosis not present

## 2017-01-13 DIAGNOSIS — N183 Chronic kidney disease, stage 3 (moderate): Secondary | ICD-10-CM | POA: Diagnosis not present

## 2017-01-13 LAB — POCT INR: INR: 1.6

## 2017-01-16 ENCOUNTER — Encounter: Payer: Self-pay | Admitting: Family Medicine

## 2017-01-16 ENCOUNTER — Ambulatory Visit: Payer: Medicare Other | Attending: Family Medicine | Admitting: Family Medicine

## 2017-01-16 VITALS — BP 111/83 | HR 118 | Temp 97.6°F | Resp 16 | Wt 169.4 lb

## 2017-01-16 DIAGNOSIS — M109 Gout, unspecified: Secondary | ICD-10-CM | POA: Diagnosis not present

## 2017-01-16 DIAGNOSIS — I482 Chronic atrial fibrillation, unspecified: Secondary | ICD-10-CM

## 2017-01-16 DIAGNOSIS — L03116 Cellulitis of left lower limb: Secondary | ICD-10-CM | POA: Diagnosis not present

## 2017-01-16 DIAGNOSIS — Z79899 Other long term (current) drug therapy: Secondary | ICD-10-CM | POA: Diagnosis not present

## 2017-01-16 DIAGNOSIS — I130 Hypertensive heart and chronic kidney disease with heart failure and stage 1 through stage 4 chronic kidney disease, or unspecified chronic kidney disease: Secondary | ICD-10-CM | POA: Diagnosis not present

## 2017-01-16 DIAGNOSIS — M79673 Pain in unspecified foot: Secondary | ICD-10-CM | POA: Diagnosis present

## 2017-01-16 DIAGNOSIS — Z7901 Long term (current) use of anticoagulants: Secondary | ICD-10-CM | POA: Insufficient documentation

## 2017-01-16 DIAGNOSIS — E1122 Type 2 diabetes mellitus with diabetic chronic kidney disease: Secondary | ICD-10-CM | POA: Diagnosis not present

## 2017-01-16 DIAGNOSIS — Z953 Presence of xenogenic heart valve: Secondary | ICD-10-CM | POA: Diagnosis not present

## 2017-01-16 DIAGNOSIS — E785 Hyperlipidemia, unspecified: Secondary | ICD-10-CM | POA: Diagnosis not present

## 2017-01-16 DIAGNOSIS — N184 Chronic kidney disease, stage 4 (severe): Secondary | ICD-10-CM | POA: Insufficient documentation

## 2017-01-16 DIAGNOSIS — I5022 Chronic systolic (congestive) heart failure: Secondary | ICD-10-CM | POA: Insufficient documentation

## 2017-01-16 DIAGNOSIS — E118 Type 2 diabetes mellitus with unspecified complications: Secondary | ICD-10-CM | POA: Diagnosis not present

## 2017-01-16 DIAGNOSIS — Z794 Long term (current) use of insulin: Secondary | ICD-10-CM | POA: Diagnosis not present

## 2017-01-16 LAB — COMPLETE METABOLIC PANEL WITH GFR
ALBUMIN: 3.7 g/dL (ref 3.6–5.1)
ALK PHOS: 154 U/L — AB (ref 40–115)
ALT: 20 U/L (ref 9–46)
AST: 41 U/L — AB (ref 10–35)
BILIRUBIN TOTAL: 1.7 mg/dL — AB (ref 0.2–1.2)
BUN: 39 mg/dL — AB (ref 7–25)
CALCIUM: 8.9 mg/dL (ref 8.6–10.3)
CO2: 25 mmol/L (ref 20–31)
CREATININE: 1.61 mg/dL — AB (ref 0.70–1.33)
Chloride: 101 mmol/L (ref 98–110)
GFR, Est African American: 54 mL/min — ABNORMAL LOW (ref >=60)
GFR, Est Non African American: 47 mL/min — ABNORMAL LOW (ref >=60)
GLUCOSE: 78 mg/dL (ref 65–99)
POTASSIUM: 4.7 mmol/L (ref 3.5–5.3)
Sodium: 135 mmol/L (ref 135–146)
TOTAL PROTEIN: 7 g/dL (ref 6.1–8.1)

## 2017-01-16 LAB — GLUCOSE, POCT (MANUAL RESULT ENTRY): POC Glucose: 88 mg/dl (ref 70–99)

## 2017-01-16 LAB — POCT GLYCOSYLATED HEMOGLOBIN (HGB A1C): Hemoglobin A1C: 6

## 2017-01-16 MED ORDER — CEPHALEXIN 500 MG PO CAPS: 500.0000 mg | ORAL_CAPSULE | Freq: Two times a day (BID) | ORAL | 0 refills | Status: DC

## 2017-01-16 NOTE — Progress Notes (Signed)
Subjective:  Patient ID: Michael [name], male    DOB: 1961/09/21  Age: 56 y.o. MRN: [id number]  CC: Foot Pain   HPI Michael [name] is a 58 with a history of Type 2DM (A1c 6.0), hypertension, hyperlipidemia, mitral valve replacement with porcine valve, tricuspid valve repair, atrial fibrillation (on anticoagulation with Coumadin),Chronic systolic heartfailure  (EF 40 - 45% from 01/2016), CKD stage IV, Gout who comes into the clinic for follow-up visit.  He complains of left lower leg and foot pain with associated swelling for the last 1 week he also complains of itching. Denies fever. He was treated for a gout flare of his R knee at his last office visit and completed a course of prednisone; symptoms in his right knee have resolved.  Past Medical History:  Diagnosis Date  . Anemia of chronic disease   . Asthma   . Chronic kidney disease (CKD), stage IV (severe) ([place])   . Chronic systolic CHF (congestive heart failure) (HCC)    a. prior EF normal; b. Echo 10/16: Mild LVH, EF 30-35%, mitral valve bioprosthesis present without evidence of stenosis or regurgitation, severe LAE, mild RVE with mild to moderately reduced RVSF, moderate RAE  . Coronary artery disease   . Diabetes mellitus type 2 in obese ([place])   . Diabetes mellitus without complication ([place])   . GERD (gastroesophageal reflux disease)   . H/O tricuspid valve repair 2012  . History of echocardiogram    Echo at [facility] in [facility] 4/12: mod LVH, EF 60-65%, mod LAE, tissue MVR ok, mod TR, mod pulmo HTN with RVSP 48 mmHg  . Hypertension   . Obstructive sleep apnea   . Paroxysmal atrial fibrillation (HCC)    s/p Maze procedure at time of MVR in 2011  . Pulmonary HTN   . 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 CARDIAC SURGERY      No Known Allergies   Outpatient Medications Prior to Visit  Medication Sig Dispense Refill  . ACCU-CHEK SOFTCLIX  LANCETS lancets Use 3 times daily as directed (Patient not taking: Reported on 12/12/2016) 100 each 12  . acetaminophen (TYLENOL) 500 MG tablet Take 1 tablet (500 mg total) by mouth every 6 (six) hours as needed. (Patient not taking: Reported on 12/12/2016) 30 tablet 0  . allopurinol (ZYLOPRIM) 300 MG tablet Take 1 tablet (300 mg total) by mouth daily. 30 tablet 6  . atorvastatin (LIPITOR) 20 MG tablet Take 1 tablet (20 mg total) by mouth daily. 30 tablet 3  . Blood Glucose Monitoring Suppl (ACCU-CHEK AVIVA) device Use as Instructed 3 times daily (Patient not taking: Reported on 12/12/2016) 1 each 0  . colchicine 0.6 MG tablet Take 2 tablets (1.2 mg) at the onset of a gout attack, may repeat 1 tablet (0.6 mg) if symptoms persist. 30 tablet 3  . diclofenac sodium (VOLTAREN) 1 % GEL Apply 4 g topically 4 (four) times daily. 100 g 2  . digoxin (LANOXIN) 0.125 MG tablet Take 1 tablet (0.125 mg total) by mouth daily. (Patient not taking: Reported on 11/03/2016) 30 tablet 11  . feeding supplement, ENSURE ENLIVE, (ENSURE ENLIVE) LIQD Take 237 mLs by mouth 2 (two) times daily between meals. (Patient not taking: Reported on 11/03/2016) 237 mL 12  . ferrous sulfate 325 (65 FE) MG tablet Take 1 tablet (325 mg total) by mouth daily with breakfast. (Patient not taking: Reported on 11/03/2016) 30 tablet 0  .  folic acid (FOLVITE) 1 MG tablet Take 1 tablet (1 mg total) by mouth daily. 30 tablet 2  . furosemide (LASIX) 80 MG tablet Take 1 tablet (80 mg total) by mouth 2 (two) times daily. 60 tablet 3  . glucose blood (ACCU-CHEK AVIVA) test strip Use 3 times daily as directed. (Patient not taking: Reported on 12/12/2016) 100 each 5  . Insulin Detemir (LEVEMIR FLEXTOUCH) 100 UNIT/ML Pen Inject 10 Units into the skin daily at 10 pm. 15 mL 11  . Insulin Pen Needle 31G X 5 MM MISC 1 each by Does not apply route at bedtime. 30 each 5  . Lancet Devices (ACCU-CHEK SOFTCLIX) lancets Use as instructed 3 times daily (Patient not  taking: Reported on 12/12/2016) 1 each 5  . metoprolol (LOPRESSOR) 100 MG tablet Take 1 tablet (100 mg total) by mouth 2 (two) times daily. 60 tablet 5  . omeprazole (PRILOSEC) 20 MG capsule Take 1 capsule (20 mg total) by mouth daily. 30 capsule 3  . potassium chloride SA (K-DUR,KLOR-CON) 20 MEQ tablet Take 20 mEq by mouth 2 (two) times daily.    . traMADol (ULTRAM) 50 MG tablet Take 1 tablet (50 mg total) by mouth every 12 (twelve) hours as needed. 60 tablet 2  . warfarin (COUMADIN) 2.5 MG tablet Take 1-2 tablets by mouth daily as directed by coumadin clinic 45 tablet 2  . predniSONE (DELTASONE) 20 MG tablet Take 2 tablets (40 mg total) by mouth daily with breakfast. (Patient not taking: Reported on 01/16/2017) 10 tablet 0   No facility-administered medications prior to visit.     ROS Review of Systems  Constitutional: Negative for activity change and appetite change.  HENT: Negative for sinus pressure and sore throat.   Eyes: Negative for visual disturbance.  Respiratory: Negative for cough, chest tightness and shortness of breath.   Cardiovascular: Negative for chest pain and leg swelling.  Gastrointestinal: Negative for abdominal distention, abdominal pain, constipation and diarrhea.  Endocrine: Negative.   Genitourinary: Negative for dysuria.  Musculoskeletal:       See hpi  Skin: Negative for rash.  Allergic/Immunologic: Negative.   Neurological: Negative for weakness, light-headedness and numbness.  Psychiatric/Behavioral: Negative for dysphoric mood and suicidal ideas.    Objective:  BP 111/83   Pulse (!) 118   Temp 97.6 F (36.4 C) (Oral)   Resp 16   Wt 169 lb 6.4 oz (76.8 kg)   SpO2 100%   BMI 29.08 kg/m   BP/Weight 01/16/2017 12/12/2016 78/46/9629  Systolic BP 528 94 413  Diastolic BP 83 64 79  Wt. (Lbs) 169.4 163.2 174.4  BMI 29.08 28.01 29.02      Physical Exam Constitutional: He is oriented to person, place, and time. He appears well-developed and  well-nourished.  Neck : no JVD Cardiovascular: Normal rate, normal heart sounds and intact distal pulses.  An irregularly irregular rhythm present.  No murmur heard. Pulmonary/Chest: Effort normal and breath sounds normal. He has no wheezes. He has no rales. He exhibits no tenderness.  Abdominal: Soft. Bowel sounds are normal. He exhibits no distension and no mass. There is no tenderness.  Musculoskeletal: Left lower leg with foreign: Posterior aspect, no discharge, tender to palpation and associated mild erythema and nonpitting edema. Right leg is normal. Slight purplish discoloration of left foot Neurovascular bundle is intact. Neurological: He is alert and oriented to person, place, and time.  Psych: normal Lab Results  Component Value Date   HGBA1C 6.0 01/16/2017    Assessment &  Plan:   1. Diabetes mellitus with complication ([place]) Controlled with A1c of 6.0 Continue current management - POCT glucose (manual entry) - POCT glycosylated hemoglobin (Hb A1C) - Ambulatory referral to Podiatry - Ambulatory referral to Ophthalmology - COMPLETE METABOLIC PANEL WITH GFR - Microalbumin / creatinine urine ratio  2. Cellulitis of left lower extremity Placed on Keflex Advised to reduce dose of Coumadin to 2.5 mg daily for the next 1 week due to increased risk of bleeding from interaction with Keflex. He will resume his regular dose once Keflex is completed.  3. Chronic atrial fibrillation (HCC) Currently in atrial fibrillation Rate control with metoprolol and anticoagulated with Coumadin He has not seen cardiology in a while. - Ambulatory referral to Cardiology   Meds ordered this encounter  Medications  . cephALEXin (KEFLEX) 500 MG capsule    Sig: Take 1 capsule (500 mg total) by mouth 2 (two) times daily.    Dispense:  14 capsule    Refill:  0    Follow-up: Return in about 2 weeks (around 01/30/2017) for follow up on celulitis.   [name] MD

## 2017-01-17 LAB — MICROALBUMIN / CREATININE URINE RATIO
CREATININE, URINE: 53 mg/dL (ref 20–370)
Microalb Creat Ratio: 474 mcg/mg creat — ABNORMAL HIGH (ref <30)
Microalb, Ur: 25.1 mg/dL

## 2017-01-20 MED FILL — FOLIC ACID 1 MG TABLET: 1 | 30 days supply | Qty: 30 | Fill #1

## 2017-01-20 MED FILL — traMADol HCL 50 MG TABS: 50 | 30 days supply | Qty: 60 | Fill #1

## 2017-01-20 MED FILL — FUROSEMIDE 80 MG TABLET: 80 | 30 days supply | Qty: 60 | Fill #1

## 2017-01-23 NOTE — Progress Notes (Deleted)
Cardiology Office Note   Date:  01/23/2017   ID:  Thunder, [place] 22-Jan-1961, MRN [id number]  PCP:  [name], MD  Cardiologist:   [name] MD  No chief complaint on file.      History of Present Illness: Michael [name] is a 56 y.o. male seen for follow up CHF. He has a history of mitral valve disease status post bioprosthetic mitral valve replacement in 2012 at [facility] in [place], tricuspid valve repair, CAD, PAF, CKD, OSA, diabetes, HTN, HL, CHF, pulmonary hypertension, anemia, poor adherence with medications. Echo done in [place] in 4/12 with normal LVF (EF 60-65%).   Admitted in 10/16 with AF with RVR and a/c systolic CHF. EF was 30-35%. He was seen back in the office in follow-up but then readmitted 86/57 with a/c systolic CHF. His weight was up about 20 pounds. ACE inhibitor was stopped secondary to worsening creatinine and he was placed on hydralazine, nitrates. Metoprolol was resumed with good rate control. He has previously not been clear about his medications.   He was lst seen by Dr. Harrington Challenger  in March 2017.   Set up for f/u echo. This showed EF 40-45%. Moderate AI, severe TR. MV bioprosthesis with area 2.02 cm squared. Severe biatrial enlargement.   Since that visit patient has been admitted numerous times- often due to noncompliance with medical therapy. Admitted in April 4-6 with AFib and RVR due to noncompliance. Readmitted 4/10-12 with viral gastroenteritis. Admitted 4/28-5/2 with acute CHF and ARF with creatinine up to 4.6. Admitted 7/16-17 with supratherapeutic INR. Creatinine at that time 2.16. Last admitted 11/20-21 with epistaxis. Seen by our service and recommended continuing coumadin long term.     Outpatient Medications Prior to Visit  Medication Sig Dispense Refill  . ACCU-CHEK SOFTCLIX LANCETS lancets Use 3 times daily as directed (Patient not taking: Reported on 12/12/2016) 100 each 12  . acetaminophen (TYLENOL) 500 MG tablet Take 1 tablet  (500 mg total) by mouth every 6 (six) hours as needed. (Patient not taking: Reported on 12/12/2016) 30 tablet 0  . allopurinol (ZYLOPRIM) 300 MG tablet Take 1 tablet (300 mg total) by mouth daily. 30 tablet 6  . atorvastatin (LIPITOR) 20 MG tablet Take 1 tablet (20 mg total) by mouth daily. 30 tablet 3  . Blood Glucose Monitoring Suppl (ACCU-CHEK AVIVA) device Use as Instructed 3 times daily (Patient not taking: Reported on 12/12/2016) 1 each 0  . cephALEXin (KEFLEX) 500 MG capsule Take 1 capsule (500 mg total) by mouth 2 (two) times daily. 14 capsule 0  . colchicine 0.6 MG tablet Take 2 tablets (1.2 mg) at the onset of a gout attack, may repeat 1 tablet (0.6 mg) if symptoms persist. 30 tablet 3  . diclofenac sodium (VOLTAREN) 1 % GEL Apply 4 g topically 4 (four) times daily. 100 g 2  . digoxin (LANOXIN) 0.125 MG tablet Take 1 tablet (0.125 mg total) by mouth daily. (Patient not taking: Reported on 11/03/2016) 30 tablet 11  . feeding supplement, ENSURE ENLIVE, (ENSURE ENLIVE) LIQD Take 237 mLs by mouth 2 (two) times daily between meals. (Patient not taking: Reported on 11/03/2016) 237 mL 12  . ferrous sulfate 325 (65 FE) MG tablet Take 1 tablet (325 mg total) by mouth daily with breakfast. (Patient not taking: Reported on 11/03/2016) 30 tablet 0  . folic acid (FOLVITE) 1 MG tablet Take 1 tablet (1 mg total) by mouth daily. 30 tablet 2  . furosemide (LASIX) 80 MG tablet Take 1 tablet (  80 mg total) by mouth 2 (two) times daily. 60 tablet 3  . glucose blood (ACCU-CHEK AVIVA) test strip Use 3 times daily as directed. (Patient not taking: Reported on 12/12/2016) 100 each 5  . Insulin Detemir (LEVEMIR FLEXTOUCH) 100 UNIT/ML Pen Inject 10 Units into the skin daily at 10 pm. 15 mL 11  . Insulin Pen Needle 31G X 5 MM MISC 1 each by Does not apply route at bedtime. 30 each 5  . Lancet Devices (ACCU-CHEK SOFTCLIX) lancets Use as instructed 3 times daily (Patient not taking: Reported on 12/12/2016) 1 each 5  .  metoprolol (LOPRESSOR) 100 MG tablet Take 1 tablet (100 mg total) by mouth 2 (two) times daily. 60 tablet 5  . omeprazole (PRILOSEC) 20 MG capsule Take 1 capsule (20 mg total) by mouth daily. 30 capsule 3  . potassium chloride SA (K-DUR,KLOR-CON) 20 MEQ tablet Take 20 mEq by mouth 2 (two) times daily.    . traMADol (ULTRAM) 50 MG tablet Take 1 tablet (50 mg total) by mouth every 12 (twelve) hours as needed. 60 tablet 2  . warfarin (COUMADIN) 2.5 MG tablet Take 1-2 tablets by mouth daily as directed by coumadin clinic 45 tablet 2   No facility-administered medications prior to visit.      Allergies:   Patient has no known allergies.   Past Medical History:  Diagnosis Date  . Anemia of chronic disease   . Asthma   . Chronic kidney disease (CKD), stage IV (severe) ([place])   . Chronic systolic CHF (congestive heart failure) (HCC)    a. prior EF normal; b. Echo 10/16: Mild LVH, EF 30-35%, mitral valve bioprosthesis present without evidence of stenosis or regurgitation, severe LAE, mild RVE with mild to moderately reduced RVSF, moderate RAE  . Coronary artery disease   . Diabetes mellitus type 2 in obese ([place])   . Diabetes mellitus without complication ([place])   . GERD (gastroesophageal reflux disease)   . H/O tricuspid valve repair 2012  . History of echocardiogram    Echo at [facility] in [facility] 4/12: mod LVH, EF 60-65%, mod LAE, tissue MVR ok, mod TR, mod pulmo HTN with RVSP 48 mmHg  . Hypertension   . Obstructive sleep apnea   . Paroxysmal atrial fibrillation (HCC)    s/p Maze procedure at time of MVR in 2011  . Pulmonary HTN   . 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 CARDIAC SURGERY       Social History:  The patient  reports that he has quit smoking. He has quit using smokeless tobacco. He reports that he does not drink alcohol or use drugs.   Family History:  The patient's family history is not on file.     ROS:  Please see the history of present illness. All other systems are reviewed and  Negative to the above problem except as noted.    PHYSICAL EXAM: VS:  There were no vitals taken for this visit.  GEN: Obese and in no acute distress  HEENT: normal  Neck: JVP is increased  carotid bruits, or masses Cardiac:Irreg; no murmurs, rubs, or gallops,Tr edema  Respiratory:  clear to auscultation bilaterally, normal work of breathing GI: soft, nontender, nondistended, + BS  No hepatomegaly  MS: no deformity Moving all extremities    R foot with erythma no dorsum  Tender   Skin: warm and dry, no rash Neuro:  Strength and  sensation are intact Psych: euthymic mood, full affect  Laboratory data:   Lab Results  Component Value Date   WBC 8.7 10/06/2016   HGB 9.2 (L) 10/06/2016   HCT 28.0 (L) 10/06/2016   PLT 288 10/06/2016   GLUCOSE 78 01/16/2017   CHOL 107 02/27/2016   TRIG 72 02/27/2016   HDL 24 (L) 02/27/2016   LDLCALC 69 02/27/2016   ALT 20 01/16/2017   AST 41 (H) 01/16/2017   NA 135 01/16/2017   K 4.7 01/16/2017   CL 101 01/16/2017   CREATININE 1.61 (H) 01/16/2017   BUN 39 (H) 01/16/2017   CO2 25 01/16/2017   TSH 3.289 10/09/2015   INR 1.6 01/13/2017   HGBA1C 6.0 01/16/2017   MICROALBUR 25.1 01/16/2017    EKG:  EKG is not ordered today.  Wt Readings from Last 3 Encounters:  01/16/17 169 lb 6.4 oz (76.8 kg)  12/12/16 163 lb 3.2 oz (74 kg)  11/03/16 174 lb 6.4 oz (79.1 kg)      ASSESSMENT AND PLAN:  1  Chronic systolic CHF  Volume appears to be up some  Will recomm lasix 120 tomorrow  Check BNP and BMET today.  Keep on same meds otherwise  Set up for ehco  2.  Afibb  Rate control and coumadin  ? If he would be able to maintain SR  Difficult I think with compliance history  INR on Monday.  3  HTN  Fair control  4.  CKD  BMET today  5  Valvular dz  S/p MVR and TV repair  Ehco pending .    6  ? Gout  Check URic acid  He has had before  Prednisone qd x 3 days   Continue colchicine      Current medicines are reviewed at length with the patient today.  The patient does not have concerns regarding medicines.    Labs/ tests ordered today include:  No orders of the defined types were placed in this encounter.     Signed, Michael Martinique, MD  01/23/2017 7:31 AM    [facility] [address], [place], [place]  [address] Phone: [phone]; Fax: [phone]

## 2017-01-26 ENCOUNTER — Ambulatory Visit: Payer: Medicare Other | Admitting: Cardiology

## 2017-01-28 ENCOUNTER — Telehealth: Payer: Self-pay | Admitting: Internal Medicine

## 2017-01-28 ENCOUNTER — Ambulatory Visit (INDEPENDENT_AMBULATORY_CARE_PROVIDER_SITE_OTHER): Payer: Medicare Other | Admitting: *Deleted

## 2017-01-28 DIAGNOSIS — I059 Rheumatic mitral valve disease, unspecified: Secondary | ICD-10-CM | POA: Diagnosis not present

## 2017-01-28 DIAGNOSIS — I079 Rheumatic tricuspid valve disease, unspecified: Secondary | ICD-10-CM | POA: Diagnosis not present

## 2017-01-28 DIAGNOSIS — Z5181 Encounter for therapeutic drug level monitoring: Secondary | ICD-10-CM | POA: Diagnosis not present

## 2017-01-28 LAB — POCT INR: INR: 1.3

## 2017-01-28 MED ORDER — WARFARIN SODIUM 2.5 MG PO TABS: ORAL_TABLET | ORAL | 1 refills | Status: DC

## 2017-01-28 NOTE — Telephone Encounter (Signed)
Michael [name], pharmacist at [facility] is calling to verify the instructions for coumadin. Please call to discuss, thanks.

## 2017-01-28 NOTE — Telephone Encounter (Signed)
Verified instructions for Coumadin with pharmacist they will fill for patient.

## 2017-02-06 MED FILL — WARFARIN NA 2.5 MG TAB: 2.5 | 20 days supply | Qty: 40 | Fill #2

## 2017-02-17 NOTE — Progress Notes (Signed)
After trying several times to obtain a [name] Tax adviser decided to send a letter to the patient.

## 2017-02-21 ENCOUNTER — Encounter (HOSPITAL_COMMUNITY): Payer: Self-pay | Admitting: Emergency Medicine

## 2017-02-21 ENCOUNTER — Emergency Department (HOSPITAL_COMMUNITY)
Admission: EM | Admit: 2017-02-21 | Discharge: 2017-02-21 | Disposition: A | Discharge status: Home / Self Care | Payer: Medicare Other | Attending: Emergency Medicine | Admitting: Emergency Medicine

## 2017-02-21 DIAGNOSIS — J45909 Unspecified asthma, uncomplicated: Secondary | ICD-10-CM | POA: Insufficient documentation

## 2017-02-21 DIAGNOSIS — Z794 Long term (current) use of insulin: Secondary | ICD-10-CM | POA: Diagnosis not present

## 2017-02-21 DIAGNOSIS — N4889 Other specified disorders of penis: Secondary | ICD-10-CM | POA: Diagnosis present

## 2017-02-21 DIAGNOSIS — Z87891 Personal history of nicotine dependence: Secondary | ICD-10-CM | POA: Diagnosis not present

## 2017-02-21 DIAGNOSIS — I5023 Acute on chronic systolic (congestive) heart failure: Secondary | ICD-10-CM | POA: Diagnosis not present

## 2017-02-21 DIAGNOSIS — N184 Chronic kidney disease, stage 4 (severe): Secondary | ICD-10-CM | POA: Diagnosis not present

## 2017-02-21 DIAGNOSIS — N476 Balanoposthitis: Secondary | ICD-10-CM | POA: Diagnosis not present

## 2017-02-21 DIAGNOSIS — I130 Hypertensive heart and chronic kidney disease with heart failure and stage 1 through stage 4 chronic kidney disease, or unspecified chronic kidney disease: Secondary | ICD-10-CM | POA: Diagnosis not present

## 2017-02-21 DIAGNOSIS — E1122 Type 2 diabetes mellitus with diabetic chronic kidney disease: Secondary | ICD-10-CM | POA: Insufficient documentation

## 2017-02-21 DIAGNOSIS — N472 Paraphimosis: Secondary | ICD-10-CM | POA: Diagnosis not present

## 2017-02-21 DIAGNOSIS — I2510 Atherosclerotic heart disease of native coronary artery without angina pectoris: Secondary | ICD-10-CM | POA: Diagnosis not present

## 2017-02-21 LAB — COMPREHENSIVE METABOLIC PANEL
ALK PHOS: 146 U/L — AB (ref 38–126)
ALT: 15 U/L — ABNORMAL LOW (ref 17–63)
ANION GAP: 13 (ref 5–15)
AST: 32 U/L (ref 15–41)
Albumin: 3.1 g/dL — ABNORMAL LOW (ref 3.5–5.0)
BILIRUBIN TOTAL: 3 mg/dL — AB (ref 0.3–1.2)
BUN: 27 mg/dL — ABNORMAL HIGH (ref 6–20)
CO2: 23 mmol/L (ref 22–32)
Calcium: 9 mg/dL (ref 8.9–10.3)
Chloride: 102 mmol/L (ref 101–111)
Creatinine, Ser: 1.81 mg/dL — ABNORMAL HIGH (ref 0.61–1.24)
GFR calc non Af Amer: 40 mL/min — ABNORMAL LOW (ref >60)
GFR, EST AFRICAN AMERICAN: 46 mL/min — AB (ref >60)
Glucose, Bld: 165 mg/dL — ABNORMAL HIGH (ref 65–99)
Potassium: 3.9 mmol/L (ref 3.5–5.1)
SODIUM: 138 mmol/L (ref 135–145)
TOTAL PROTEIN: 7.5 g/dL (ref 6.5–8.1)

## 2017-02-21 LAB — URINALYSIS, ROUTINE W REFLEX MICROSCOPIC
BILIRUBIN URINE: NEGATIVE
Glucose, UA: NEGATIVE mg/dL
Ketones, ur: NEGATIVE mg/dL
LEUKOCYTES UA: NEGATIVE
NITRITE: NEGATIVE
PH: 6 (ref 5.0–8.0)
Protein, ur: 30 mg/dL — AB
Specific Gravity, Urine: 1.009 (ref 1.005–1.030)

## 2017-02-21 LAB — CBC WITH DIFFERENTIAL/PLATELET
Basophils Absolute: 0.1 10*3/uL (ref 0.0–0.1)
Basophils Relative: 1 %
Eosinophils Absolute: 0.3 10*3/uL (ref 0.0–0.7)
Eosinophils Relative: 5 %
HCT: 35.5 % — ABNORMAL LOW (ref 39.0–52.0)
Hemoglobin: 11.7 g/dL — ABNORMAL LOW (ref 13.0–17.0)
Lymphocytes Relative: 20 %
Lymphs Abs: 1.1 10*3/uL (ref 0.7–4.0)
MCH: 24.9 pg — AB (ref 26.0–34.0)
MCHC: 33 g/dL (ref 30.0–36.0)
MCV: 75.7 fL — ABNORMAL LOW (ref 78.0–100.0)
MONO ABS: 0.7 10*3/uL (ref 0.1–1.0)
Monocytes Relative: 12 %
NEUTROS PCT: 62 %
Neutro Abs: 3.5 10*3/uL (ref 1.7–7.7)
PLATELETS: 207 10*3/uL (ref 150–400)
RBC: 4.69 MIL/uL (ref 4.22–5.81)
RDW: 21 % — AB (ref 11.5–15.5)
WBC: 5.7 10*3/uL (ref 4.0–10.5)

## 2017-02-21 LAB — PROTIME-INR
INR: 2.09
PROTHROMBIN TIME: 23.8 s — AB (ref 11.4–15.2)

## 2017-02-21 MED ORDER — MORPHINE SULFATE (PF) 4 MG/ML IV SOLN
4.0000 mg | Freq: Once | INTRAVENOUS | Status: AC
  Administered 2017-02-21: 4 mg via INTRAVENOUS
  Filled 2017-02-21: qty 1

## 2017-02-21 MED ORDER — CLINDAMYCIN HCL 300 MG PO CAPS: 300.0000 mg | ORAL_CAPSULE | Freq: Four times a day (QID) | ORAL | 0 refills | Status: DC

## 2017-02-21 MED ORDER — OXYCODONE-ACETAMINOPHEN 5-325 MG PO TABS: 1.0000 | ORAL_TABLET | ORAL | 0 refills | Status: DC | PRN

## 2017-02-21 MED ORDER — CLINDAMYCIN PHOSPHATE 600 MG/50ML IV SOLN
600.0000 mg | Freq: Once | INTRAVENOUS | Status: AC
  Administered 2017-02-21: 600 mg via INTRAVENOUS
  Filled 2017-02-21: qty 50

## 2017-02-21 MED ORDER — MUPIROCIN CALCIUM 2 % EX CREA: 1.0000 "application " | TOPICAL_CREAM | Freq: Two times a day (BID) | CUTANEOUS | 0 refills | Status: DC

## 2017-02-21 NOTE — ED Notes (Signed)
Pt provided with urinal, will provide sample

## 2017-02-21 NOTE — ED Notes (Signed)
Pt understood dc material. NAD Noted. Scripts given at dc. 

## 2017-02-21 NOTE — Discharge Instructions (Signed)
Call and make an appointment to follow-up closely with the urologist. Return immediately to the emergency department if you have worsening pain, swelling, redness, fever, difficulty urinating or for any concerns.

## 2017-02-21 NOTE — ED Provider Notes (Signed)
[facility] DEPT Provider Note   CSN: [id number] Arrival date & time: 02/21/17  1756     History   Chief Complaint Chief Complaint  Patient presents with  . Penis Pain    HPI Michael [name] is a 56 y.o. male.  Pt w PMHx DM, afib, HTN, CHF, valvular disease, and other comorbidities, presents w penile pain that began a couple of days ago. Pt reports pain to be constant, worse w movement, and located towards the end of his penis w assoc swelling. Pt reports he is uncircumcised. Denies F/C, difficulty urinating, testicular pain. Pt is on coumadin. NKDA.    Pt speaks [place]: an interpreter was present during the history-taking and subsequent discussion (and for part of the physical exam) with this patient.   Past Medical History:  Diagnosis Date  . Anemia of chronic disease   . Asthma   . Chronic kidney disease (CKD), stage IV (severe) ([place])   . Chronic systolic CHF (congestive heart failure) (HCC)    a. prior EF normal; b. Echo 10/16: Mild LVH, EF 30-35%, mitral valve bioprosthesis present without evidence of stenosis or regurgitation, severe LAE, mild RVE with mild to moderately reduced RVSF, moderate RAE  . Coronary artery disease   . Diabetes mellitus type 2 in obese ([place])   . Diabetes mellitus without complication ([employer])   . GERD (gastroesophageal reflux disease)   . H/O tricuspid valve repair 2012  . History of echocardiogram    Echo at [facility] in [facility] 4/12: mod LVH, EF 60-65%, mod LAE, tissue MVR ok, mod TR, mod pulmo HTN with RVSP 48 mmHg  . Hypertension   . Obstructive sleep apnea   . Paroxysmal atrial fibrillation (HCC)    s/p Maze procedure at time of MVR in 2011  . Pulmonary HTN   . S/P mitral valve replacement with porcine valve 2012    Patient Active Problem List   Diagnosis Date Noted  . HLD (hyperlipidemia) 10/05/2016  . Mouth bleeding 10/05/2016  . Bilateral knee pain 10/05/2016  . Petechial rash 10/05/2016  . Laceration of tongue   . Insomnia  06/10/2016  . Supratherapeutic INR 06/01/2016  . Acute hepatic encephalopathy 03/15/2016  . Liver cirrhosis ([place]) 03/15/2016  . Volume overload 03/15/2016  . Acute congestive heart failure ([place])   . Insulin dependent diabetes mellitus ([place])   . Acute renal failure (ARF) ([place]) 03/14/2016  . GERD (gastroesophageal reflux disease) 03/04/2016  . Aortic regurgitation 02/27/2016  . Viral gastroenteritis 02/27/2016  . A-fib ([place]) 02/26/2016  . Abdominal pain 02/25/2016  . Diabetes mellitus with complication ([place]) 27/05/8674  . Chronic atrial fibrillation with RVR ([place]) 02/25/2016  . Acute on chronic systolic CHF (congestive heart failure) ([place]) 02/25/2016  . Anemia, chronic disease 02/25/2016  . Atrial fibrillation with RVR ([place]) 02/19/2016  . Pain in joint, ankle and foot 01/10/2016  . Asthma   . Acute hypoxemic respiratory failure ([place]) 12/29/2015  . Acute respiratory failure ([place]) 12/29/2015  . Chronic atrial fibrillation ([place]) 12/24/2015  . Dilated cardiomyopathy ([place]) 12/24/2015  . Polypharmacy 11/23/2015  . Gout 10/23/2015  . Encounter for therapeutic drug monitoring 10/19/2015  . Anemia - mild exacerbation 10/15/2015  . Chronic systolic CHF (congestive heart failure) ([place]) 10/09/2015  . Tricuspid valve disease 10/09/2015  . Mitral valve disease   . CKD (chronic kidney disease), stage IV ([place]) 09/10/2015  . Calf pain 09/10/2015  . DM type 2 (diabetes mellitus, type 2) ([place]) 09/10/2015  . Essential hypertension 09/10/2015    Past  Surgical History:  Procedure Laterality Date  . CARDIAC SURGERY         Home Medications    Prior to Admission medications   Medication Sig Start Date End Date Taking? Authorizing Provider  ACCU-CHEK SOFTCLIX LANCETS lancets Use 3 times daily as directed Patient not taking: Reported on 12/12/2016 10/16/16   [name], MD  acetaminophen (TYLENOL) 500 MG tablet Take 1 tablet (500 mg total) by mouth every 6 (six) hours as needed. Patient not  taking: Reported on 12/12/2016 10/07/16   Dron Tanna Furry, MD  allopurinol (ZYLOPRIM) 300 MG tablet Take 1 tablet (300 mg total) by mouth daily. 09/16/16   [name], MD  atorvastatin (LIPITOR) 20 MG tablet Take 1 tablet (20 mg total) by mouth daily. 07/01/16   [name], MD  Blood Glucose Monitoring Suppl (ACCU-CHEK AVIVA) device Use as Instructed 3 times daily Patient not taking: Reported on 12/12/2016 01/24/16   [name], MD  cephALEXin (KEFLEX) 500 MG capsule Take 1 capsule (500 mg total) by mouth 2 (two) times daily. 01/16/17   [name], MD  colchicine 0.6 MG tablet Take 2 tablets (1.2 mg) at the onset of a gout attack, may repeat 1 tablet (0.6 mg) if symptoms persist. 12/12/16   [name], MD  diclofenac sodium (VOLTAREN) 1 % GEL Apply 4 g topically 4 (four) times daily. 12/12/16   [name], MD  digoxin (LANOXIN) 0.125 MG tablet Take 1 tablet (0.125 mg total) by mouth daily. Patient not taking: Reported on 11/03/2016 04/01/16   [name], MD  feeding supplement, ENSURE ENLIVE, (ENSURE ENLIVE) LIQD Take 237 mLs by mouth 2 (two) times daily between meals. Patient not taking: Reported on 11/03/2016 06/02/16   Juliet Rude, MD  ferrous sulfate 325 (65 FE) MG tablet Take 1 tablet (325 mg total) by mouth daily with breakfast. Patient not taking: Reported on 11/03/2016 06/02/16   Milagros Loll, MD  folic acid (FOLVITE) 1 MG tablet Take 1 tablet (1 mg total) by mouth daily. 12/12/16   [name], MD  furosemide (LASIX) 80 MG tablet Take 1 tablet (80 mg total) by mouth 2 (two) times daily. 12/12/16   [name], MD  glucose blood (ACCU-CHEK AVIVA) test strip Use 3 times daily as directed. Patient not taking: Reported on 12/12/2016 10/16/16   [name], MD  Insulin Detemir (LEVEMIR FLEXTOUCH) 100 UNIT/ML Pen Inject 10 Units into the skin daily at 10 pm. 10/16/16   [name], MD  Insulin Pen Needle 31G X 5 MM MISC 1 each by Does not apply route at bedtime. 06/17/16   [name], MD  Lancet Devices [facility]) lancets Use as instructed 3 times daily Patient not taking: Reported on 12/12/2016 01/24/16   [name], MD  metoprolol (LOPRESSOR) 100 MG tablet Take 1 tablet (100 mg total) by mouth 2 (two) times daily. 10/16/16   [name], MD  omeprazole (PRILOSEC) 20 MG capsule Take 1 capsule (20 mg total) by mouth daily. 12/12/16   [name], MD  potassium chloride SA (K-DUR,KLOR-CON) 20 MEQ tablet Take 20 mEq by mouth 2 (two) times daily.    Historical Provider, MD  traMADol (ULTRAM) 50 MG tablet Take 1 tablet (50 mg total) by mouth every 12 (twelve) hours as needed. 12/12/16   [name], MD  warfarin (COUMADIN) 2.5 MG tablet Take coumadin as directed by coumadin clinic 01/28/17   Fay Records, MD    Family History Family History  Problem Relation Age  of Onset  . Heart attack Neg Hx     Social History Social History  Substance Use Topics  . Smoking status: Former Research scientist (life sciences)  . Smokeless tobacco: Former Systems developer  . Alcohol use No     Allergies   Patient has no known allergies.   Review of Systems Review of Systems  Constitutional: Negative for chills and fever.  Gastrointestinal: Negative for abdominal pain.  Genitourinary: Positive for penile pain and penile swelling (and redness). Negative for difficulty urinating, discharge and testicular pain.  Allergic/Immunologic: Positive for immunocompromised state (DM).     Physical Exam Updated Vital Signs BP 113/84   Pulse (!) 108   Temp 97.5 F (36.4 C) (Oral)   Resp 18   SpO2 100%   Physical Exam  Constitutional: He appears well-developed and well-nourished.  Pt appears uncomfortable.  HENT:  Head: Normocephalic and atraumatic.  Eyes: Conjunctivae are normal.  Cardiovascular: Normal rate and intact distal pulses.   Pulse 100 on exam, irregular rhythm  Pulmonary/Chest: Effort normal and breath sounds normal. No respiratory distress. He has no wheezes. He has no rales.  Abdominal:  Soft. Bowel sounds are normal. He exhibits no distension. There is no tenderness.  Genitourinary: Testes normal. Uncircumcised. Paraphimosis, penile erythema and penile tenderness present.  Genitourinary Comments: Foreskin w edema. Linear ulceration noted on dorsal foreskin. Foreskin reduced during exam. Glans is erythematous but not cyanotic.  Neurological: He is alert.  Psychiatric: He has a normal mood and affect. His behavior is normal.  Nursing note and vitals reviewed.    ED Treatments / Results  Labs (all labs ordered are listed, but only abnormal results are displayed) Labs Reviewed  CBC WITH DIFFERENTIAL/PLATELET - Abnormal; Notable for the following:       Result Value   Hemoglobin 11.7 (*)    HCT 35.5 (*)    MCV 75.7 (*)    MCH 24.9 (*)    RDW 21.0 (*)    All other components within normal limits  URINALYSIS, ROUTINE W REFLEX MICROSCOPIC  COMPREHENSIVE METABOLIC PANEL  PROTIME-INR    EKG  EKG Interpretation None       Radiology No results found.  Procedures Procedures (including critical care time)  Medications Ordered in ED Medications  morphine 4 MG/ML injection 4 mg (4 mg Intravenous Given 02/21/17 1844)     Initial Impression / Assessment and Plan / ED Course  I have reviewed the triage vital signs and the nursing notes.  Pertinent labs & imaging results that were available during my care of the patient were reviewed by me and considered in my medical decision making (see chart for details).     Pt w paraphimosis and balanoposthitis. Foreskin reduced on exam, ulceration on dorsal foreskin. Pt on coumadin; INR pending. CBC, CMP, U/A pending. Morphine given for pain. Plan to consult Urology to determine if circumcision is indicated.   Care assumed by Dr Lita Mains; Patient discussed with and seen by Dr. Lita Mains.   Final Clinical Impressions(s) / ED Diagnoses   Final diagnoses:  Paraphimosis  Balanoposthitis    New Prescriptions New  Prescriptions   No medications on file     Martinique N Russo, PA-C 02/21/17 1957    Julianne Rice, MD 02/21/17 2241

## 2017-02-21 NOTE — ED Triage Notes (Signed)
Pt presents with c/o penile pain. The pain began a couple of days ago. He speaks [place], in process of obtaining interpreter for the patient

## 2017-02-25 ENCOUNTER — Encounter: Payer: Self-pay | Admitting: Internal Medicine

## 2017-02-25 MED FILL — FOLIC ACID 1 MG TABLET: 1 | 30 days supply | Qty: 30 | Fill #2

## 2017-02-25 MED FILL — COLCRYS 0.6 MG TABLET: 0.6 | 30 days supply | Qty: 30 | Fill #2

## 2017-03-02 ENCOUNTER — Ambulatory Visit: Payer: Medicare Other | Admitting: Podiatry

## 2017-03-03 ENCOUNTER — Encounter: Payer: Self-pay | Admitting: Family Medicine

## 2017-03-03 ENCOUNTER — Ambulatory Visit: Payer: Medicare Other | Attending: Family Medicine | Admitting: Family Medicine

## 2017-03-03 VITALS — BP 114/78 | HR 96 | Temp 97.5°F | Resp 18 | Ht 63.0 in | Wt 165.0 lb

## 2017-03-03 DIAGNOSIS — I10 Essential (primary) hypertension: Secondary | ICD-10-CM

## 2017-03-03 DIAGNOSIS — E1122 Type 2 diabetes mellitus with diabetic chronic kidney disease: Secondary | ICD-10-CM | POA: Insufficient documentation

## 2017-03-03 DIAGNOSIS — Z79899 Other long term (current) drug therapy: Secondary | ICD-10-CM | POA: Insufficient documentation

## 2017-03-03 DIAGNOSIS — N184 Chronic kidney disease, stage 4 (severe): Secondary | ICD-10-CM

## 2017-03-03 DIAGNOSIS — E7800 Pure hypercholesterolemia, unspecified: Secondary | ICD-10-CM | POA: Diagnosis not present

## 2017-03-03 DIAGNOSIS — M10079 Idiopathic gout, unspecified ankle and foot: Secondary | ICD-10-CM

## 2017-03-03 DIAGNOSIS — Z794 Long term (current) use of insulin: Secondary | ICD-10-CM | POA: Insufficient documentation

## 2017-03-03 DIAGNOSIS — K219 Gastro-esophageal reflux disease without esophagitis: Secondary | ICD-10-CM | POA: Diagnosis not present

## 2017-03-03 DIAGNOSIS — R634 Abnormal weight loss: Secondary | ICD-10-CM | POA: Diagnosis not present

## 2017-03-03 DIAGNOSIS — M10072 Idiopathic gout, left ankle and foot: Secondary | ICD-10-CM | POA: Diagnosis not present

## 2017-03-03 DIAGNOSIS — Z1211 Encounter for screening for malignant neoplasm of colon: Secondary | ICD-10-CM

## 2017-03-03 DIAGNOSIS — I130 Hypertensive heart and chronic kidney disease with heart failure and stage 1 through stage 4 chronic kidney disease, or unspecified chronic kidney disease: Secondary | ICD-10-CM | POA: Diagnosis not present

## 2017-03-03 DIAGNOSIS — Z7901 Long term (current) use of anticoagulants: Secondary | ICD-10-CM | POA: Diagnosis not present

## 2017-03-03 DIAGNOSIS — E784 Other hyperlipidemia: Secondary | ICD-10-CM | POA: Diagnosis not present

## 2017-03-03 DIAGNOSIS — I5022 Chronic systolic (congestive) heart failure: Secondary | ICD-10-CM

## 2017-03-03 MED ORDER — OMEPRAZOLE 20 MG PO CPDR: 20.0000 mg | DELAYED_RELEASE_CAPSULE | Freq: Every day | ORAL | 5 refills | Status: DC

## 2017-03-03 MED ORDER — ATORVASTATIN CALCIUM 20 MG PO TABS: 20.0000 mg | ORAL_TABLET | Freq: Every day | ORAL | 5 refills | Status: DC

## 2017-03-03 NOTE — Patient Instructions (Signed)
Heart Failure °Heart failure is a condition in which the heart has trouble pumping blood because it has become weak or stiff. This means that the heart does not pump blood efficiently for the body to work well. For some people with heart failure, fluid may back up into the lungs and there may be swelling (edema) in the lower legs. Heart failure is usually a long-term (chronic) condition. It is important for you to take good care of yourself and follow the treatment plan from your health care provider. °What are the causes? °This condition is caused by some health problems, including: °· High blood pressure (hypertension). Hypertension causes the heart muscle to work harder than normal. High blood pressure eventually causes the heart to become stiff and weak. °· Coronary artery disease (CAD). CAD is the buildup of cholesterol and fat (plaques) in the arteries of the heart. °· Heart attack (myocardial infarction). Injured tissue, which is caused by the heart attack, does not contract as well and the heart's ability to pump blood is weakened. °· Abnormal heart valves. When the heart valves do not open and close properly, the heart muscle must pump harder to keep the blood flowing. °· Heart muscle disease (cardiomyopathy or myocarditis). Heart muscle disease is damage to the heart muscle from a variety of causes, such as drug or alcohol abuse, infections, or unknown causes. These can increase the risk of heart failure. °· Lung disease. When the lungs do not work properly, the heart must work harder. ° °What increases the risk? °Risk of heart failure increases as a person ages. This condition is also more likely to develop in people who: °· Are overweight. °· Are male. °· Smoke or chew tobacco. °· Abuse alcohol or illegal drugs. °· Have taken medicines that can damage the heart, such as chemotherapy drugs. °· Have diabetes. °? High blood sugar (glucose) is associated with high fat (lipid) levels in the blood. °? Diabetes  can also damage tiny blood vessels that carry nutrients to the heart muscle. °· Have abnormal heart rhythms. °· Have thyroid problems. °· Have low blood counts (anemia). ° °What are the signs or symptoms? °Symptoms of this condition include: °· Shortness of breath with activity, such as when climbing stairs. °· Persistent cough. °· Swelling of the feet, ankles, legs, or abdomen. °· Unexplained weight gain. °· Difficulty breathing when lying flat (orthopnea). °· Waking from sleep because of the need to sit up and get more air. °· Rapid heartbeat. °· Fatigue and loss of energy. °· Feeling light-headed, dizzy, or close to fainting. °· Loss of appetite. °· Nausea. °· Increased urination during the night (nocturia). °· Confusion. ° °How is this diagnosed? °This condition is diagnosed based on: °· Medical history, symptoms, and a physical exam. °· Diagnostic tests, which may include: °? Echocardiogram. °? Electrocardiogram (ECG). °? Chest X-ray. °? Blood tests. °? Exercise stress test. °? Radionuclide scans. °? Cardiac catheterization and angiogram. ° °How is this treated? °Treatment for this condition is aimed at managing the symptoms of heart failure. Medicines, behavioral changes, or other treatments may be necessary to treat heart failure. °Medicines °These may include: °· Angiotensin-converting enzyme (ACE) inhibitors. This type of medicine blocks the effects of a blood protein called angiotensin-converting enzyme. ACE inhibitors relax (dilate) the blood vessels and help to lower blood pressure. °· Angiotensin receptor blockers (ARBs). This type of medicine blocks the actions of a blood protein called angiotensin. ARBs dilate the blood vessels and help to lower blood pressure. °· Water   pills (diuretics). Diuretics cause the kidneys to remove salt and water from the blood. The extra fluid is removed through urination, leaving a lower volume of blood that the heart has to pump. °· Beta blockers. These improve heart  muscle strength and they prevent the heart from beating too quickly. °· Digoxin. This increases the force of the heartbeat. ° °Healthy behavior changes °These may include: °· Reaching and maintaining a healthy weight. °· Stopping smoking or chewing tobacco. °· Eating heart-healthy foods. °· Limiting or avoiding alcohol. °· Stopping use of street drugs (illegal drugs). °· Physical activity. ° °Other treatments °These may include: °· Surgery to open blocked coronary arteries or repair damaged heart valves. °· Placement of a biventricular pacemaker to improve heart muscle function (cardiac resynchronization therapy). This device paces both the right ventricle and left ventricle. °· Placement of a device to treat serious abnormal heart rhythms (implantable cardioverter defibrillator, or ICD). °· Placement of a device to improve the pumping ability of the heart (left ventricular assist device, or LVAD). °· Heart transplant. This can cure heart failure, and it is considered for certain patients who do not improve with other therapies. ° °Follow these instructions at home: °Medicines °· Take over-the-counter and prescription medicines only as told by your health care provider. Medicines are important in reducing the workload of your heart, slowing the progression of heart failure, and improving your symptoms. °? Do not stop taking your medicine unless your health care provider told you to do that. °? Do not skip any dose of medicine. °? Refill your prescriptions before you run out of medicine. You need your medicines every day. °Eating and drinking ° °· Eat heart-healthy foods. Talk with a dietitian to make an eating plan that is right for you. °? Choose foods that contain no trans fat and are low in saturated fat and cholesterol. Healthy choices include fresh or frozen fruits and vegetables, fish, lean meats, legumes, fat-free or low-fat dairy products, and whole-grain or high-fiber foods. °? Limit salt (sodium) if  directed by your health care provider. Sodium restriction may reduce symptoms of heart failure. Ask a dietitian to recommend heart-healthy seasonings. °? Use healthy cooking methods instead of frying. Healthy methods include roasting, grilling, broiling, baking, poaching, steaming, and stir-frying. °· Limit your fluid intake if directed by your health care provider. Fluid restriction may reduce symptoms of heart failure. °Lifestyle °· Stop smoking or using chewing tobacco. Nicotine and tobacco can damage your heart and your blood vessels. Do not use nicotine gum or patches before talking to your health care provider. °· Limit alcohol intake to no more than 1 drink per day for non-pregnant women and 2 drinks per day for men. One drink equals 12 oz of beer, 5 oz of wine, or 1½ oz of hard liquor. °? Drinking more than that is harmful to your heart. Tell your health care provider if you drink alcohol several times a week. °? Talk with your health care provider about whether any level of alcohol use is safe for you. °? If your heart has already been damaged by alcohol or you have severe heart failure, drinking alcohol should be stopped completely. °· Stop use of illegal drugs. °· Lose weight if directed by your health care provider. Weight loss may reduce symptoms of heart failure. °· Do moderate physical activity if directed by your health care provider. People who are elderly and people with severe heart failure should consult with a health care provider for physical activity recommendations. °  Monitor important information °· Weigh yourself every day. Keeping track of your weight daily helps you to notice excess fluid sooner. °? Weigh yourself every morning after you urinate and before you eat breakfast. °? Wear the same amount of clothing each time you weigh yourself. °? Record your daily weight. Provide your health care provider with your weight record. °· Monitor and record your blood pressure as told by your health  care provider. °· Check your pulse as told by your health care provider. °Dealing with extreme temperatures °· If the weather is extremely hot: °? Avoid vigorous physical activity. °? Use air conditioning or fans or seek a cooler location. °? Avoid caffeine and alcohol. °? Wear loose-fitting, lightweight, and light-colored clothing. °· If the weather is extremely cold: °? Avoid vigorous physical activity. °? Layer your clothes. °? Wear mittens or gloves, a hat, and a scarf when you go outside. °? Avoid alcohol. °General instructions °· Manage other health conditions such as hypertension, diabetes, thyroid disease, or abnormal heart rhythms as told by your health care provider. °· Learn to manage stress. If you need help to do this, ask your health care provider. °· Plan rest periods when fatigued. °· Get ongoing education and support as needed. °· Participate in or seek rehabilitation as needed to maintain or improve independence and quality of life. °· Stay up to date with immunizations. Keeping current on pneumococcal and influenza immunizations is especially important to prevent respiratory infections. °· Keep all follow-up visits as told by your health care provider. This is important. °Contact a health care provider if: °· You have a rapid weight gain. °· You have increasing shortness of breath that is unusual for you. °· You are unable to participate in your usual physical activities. °· You tire easily. °· You cough more than normal, especially with physical activity. °· You have any swelling or more swelling in areas such as your hands, feet, ankles, or abdomen. °· You are unable to sleep because it is hard to breathe. °· You feel like your heart is beating quickly (palpitations). °· You become dizzy or light-headed when you stand up. °Get help right away if: °· You have difficulty breathing. °· You notice or your family notices a change in your awareness, such as having trouble staying awake or having  difficulty with concentration. °· You have pain or discomfort in your chest. °· You have an episode of fainting (syncope). °This information is not intended to replace advice given to you by your health care provider. Make sure you discuss any questions you have with your health care provider. °Document Released: 11/03/2005 Document Revised: 07/08/2016 Document Reviewed: 05/28/2016 °Elsevier Interactive Patient Education © 2017 [employer] ° °

## 2017-03-03 NOTE — Progress Notes (Signed)
Patient is here for Gout flareup  Patient denies pain at this time.  Patient has not eaten today. Patient has not taken medication today.

## 2017-03-03 NOTE — Progress Notes (Signed)
Subjective:  Patient ID: Michael [name], male    DOB: December 03, 1960  Age: 56 y.o. MRN: [id number]  CC: Diabetes and hypertension  HPI Michael [name] is a 68 with a history of Type 2DM (A1c 6.0), hypertension, hyperlipidemia, mitral valve replacement with porcine valve, tricuspid valve repair, atrial fibrillation (on anticoagulation with Coumadin),Chronic systolic heartfailure  (EF 40 - 45% from 01/2016), CKD stage IV, Gout who comes into the clinic for follow-up visit.  He has lost 40 pounds in the last 1 year and informs me sometimes he loses his appetite. Denies blood in stools, abdominal pain, tremors or diarrhea.  Denies chest pains, shortness of breath, wheezing and he has had no recent gout flare which is surprisingly as he does seem to have multiple flares one of which was treated at his last visit to the clinic. He was seen at the ED recently for paraphimosis and was placed on clindamycin; he reports improvement in symptoms.   Last INR was 2.0 and he is closely followed by the Coumadin clinic where he has INR checks. He has an upcoming appointment with cardiology at the end of the month. His chronic kidney disease is managed by [facility].  Past Medical History:  Diagnosis Date  . Anemia of chronic disease   . Asthma   . Chronic kidney disease (CKD), stage IV (severe) ([place])   . Chronic systolic CHF (congestive heart failure) (HCC)    a. prior EF normal; b. Echo 10/16: Mild LVH, EF 30-35%, mitral valve bioprosthesis present without evidence of stenosis or regurgitation, severe LAE, mild RVE with mild to moderately reduced RVSF, moderate RAE  . Coronary artery disease   . Diabetes mellitus type 2 in obese ([place])   . Diabetes mellitus without complication ([place])   . GERD (gastroesophageal reflux disease)   . H/O tricuspid valve repair 2012  . History of echocardiogram    Echo at [facility] in [facility] 4/12: mod LVH, EF 60-65%, mod LAE, tissue MVR ok, mod TR, mod pulmo HTN with  RVSP 48 mmHg  . Hypertension   . Obstructive sleep apnea   . Paroxysmal atrial fibrillation (HCC)    s/p Maze procedure at time of MVR in 2011  . Pulmonary HTN ([place])   . 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 CARDIAC SURGERY      No Known Allergies    Outpatient Medications Prior to Visit  Medication Sig Dispense Refill  . ACCU-CHEK SOFTCLIX LANCETS lancets Use 3 times daily as directed (Patient not taking: Reported on 12/12/2016) 100 each 12  . acetaminophen (TYLENOL) 500 MG tablet Take 1 tablet (500 mg total) by mouth every 6 (six) hours as needed. (Patient not taking: Reported on 12/12/2016) 30 tablet 0  . allopurinol (ZYLOPRIM) 300 MG tablet Take 1 tablet (300 mg total) by mouth daily. 30 tablet 6  . Blood Glucose Monitoring Suppl (ACCU-CHEK AVIVA) device Use as Instructed 3 times daily (Patient not taking: Reported on 12/12/2016) 1 each 0  . clindamycin (CLEOCIN) 300 MG capsule Take 1 capsule (300 mg total) by mouth 4 (four) times daily. X 7 days 28 capsule 0  . colchicine 0.6 MG tablet Take 2 tablets (1.2 mg) at the onset of a gout attack, may repeat 1 tablet (0.6 mg) if symptoms persist. 30 tablet 3  . diclofenac sodium (VOLTAREN) 1 % GEL Apply 4 g topically 4 (four) times daily. 100 g 2  . digoxin (  LANOXIN) 0.125 MG tablet Take 1 tablet (0.125 mg total) by mouth daily. (Patient not taking: Reported on 11/03/2016) 30 tablet 11  . feeding supplement, ENSURE ENLIVE, (ENSURE ENLIVE) LIQD Take 237 mLs by mouth 2 (two) times daily between meals. (Patient not taking: Reported on 11/03/2016) 237 mL 12  . ferrous sulfate 325 (65 FE) MG tablet Take 1 tablet (325 mg total) by mouth daily with breakfast. (Patient not taking: Reported on 11/03/2016) 30 tablet 0  . folic acid (FOLVITE) 1 MG tablet Take 1 tablet (1 mg total) by mouth daily. 30 tablet 2  . furosemide (LASIX) 80 MG tablet Take 1 tablet (80 mg total) by mouth 2  (two) times daily. 60 tablet 3  . glucose blood (ACCU-CHEK AVIVA) test strip Use 3 times daily as directed. (Patient not taking: Reported on 12/12/2016) 100 each 5  . Insulin Detemir (LEVEMIR FLEXTOUCH) 100 UNIT/ML Pen Inject 10 Units into the skin daily at 10 pm. 15 mL 11  . Insulin Pen Needle 31G X 5 MM MISC 1 each by Does not apply route at bedtime. 30 each 5  . Lancet Devices (ACCU-CHEK SOFTCLIX) lancets Use as instructed 3 times daily (Patient not taking: Reported on 12/12/2016) 1 each 5  . metoprolol (LOPRESSOR) 100 MG tablet Take 1 tablet (100 mg total) by mouth 2 (two) times daily. 60 tablet 5  . mupirocin cream (BACTROBAN) 2 % Apply 1 application topically 2 (two) times daily. 15 g 0  . oxyCODONE-acetaminophen (PERCOCET) 5-325 MG tablet Take 1 tablet by mouth every 4 (four) hours as needed for severe pain. 10 tablet 0  . potassium chloride SA (K-DUR,KLOR-CON) 20 MEQ tablet Take 20 mEq by mouth 2 (two) times daily.    . traMADol (ULTRAM) 50 MG tablet Take 1 tablet (50 mg total) by mouth every 12 (twelve) hours as needed. 60 tablet 2  . warfarin (COUMADIN) 2.5 MG tablet Take coumadin as directed by coumadin clinic 45 tablet 1  . atorvastatin (LIPITOR) 20 MG tablet Take 1 tablet (20 mg total) by mouth daily. 30 tablet 3  . cephALEXin (KEFLEX) 500 MG capsule Take 1 capsule (500 mg total) by mouth 2 (two) times daily. 14 capsule 0  . omeprazole (PRILOSEC) 20 MG capsule Take 1 capsule (20 mg total) by mouth daily. 30 capsule 3   No facility-administered medications prior to visit.     ROS Review of Systems  Constitutional: Negative for activity change and appetite change.  HENT: Negative for sinus pressure and sore throat.   Eyes: Negative for visual disturbance.  Respiratory: Negative for cough, chest tightness and shortness of breath.   Cardiovascular: Negative for chest pain and leg swelling.  Gastrointestinal: Negative for abdominal distention, abdominal pain, constipation and diarrhea.   Endocrine: Negative.   Genitourinary: Negative for dysuria.  Musculoskeletal: Negative for joint swelling and myalgias.  Skin: Negative for rash.  Allergic/Immunologic: Negative.   Neurological: Negative for weakness, light-headedness and numbness.  Psychiatric/Behavioral: Negative for dysphoric mood and suicidal ideas.    Objective:  BP 114/78 (BP Location: Left Arm, Patient Position: Sitting, Cuff Size: Normal)   Pulse 96   Temp 97.5 F (36.4 C) (Oral)   Resp 18   Ht 5\' 3"  (1.6 m)   Wt 165 lb (74.8 kg)   SpO2 96%   BMI 29.23 kg/m   BP/Weight 03/03/2017 02/17/3535 11/20/4313  Systolic BP 400 867 619  Diastolic BP 78 87 83  Wt. (Lbs) 165 - 169.4  BMI 29.23 - 29.08  Physical Exam  Constitutional: He is oriented to person, place, and time. He appears well-developed and well-nourished.  Cardiovascular: Normal rate, normal heart sounds and intact distal pulses.  An irregularly irregular rhythm present.  No murmur heard. Pulmonary/Chest: Effort normal and breath sounds normal. He has no wheezes. He has no rales. He exhibits no tenderness.  Abdominal: Soft. Bowel sounds are normal. He exhibits no distension and no mass. There is no tenderness.  Musculoskeletal: Normal range of motion.  Neurological: He is alert and oriented to person, place, and time.  Skin: Skin is warm and dry.  Psychiatric: He has a normal mood and affect.    CMP Latest Ref Rng & Units 02/21/2017 01/16/2017 10/06/2016  Glucose 65 - 99 mg/dL 165(H) 78 99  BUN 6 - 20 mg/dL 27(H) 39(H) 40(H)  Creatinine 0.61 - 1.24 mg/dL 1.81(H) 1.61(H) 2.36(H)  Sodium 135 - 145 mmol/L 138 135 135  Potassium 3.5 - 5.1 mmol/L 3.9 4.7 3.9  Chloride 101 - 111 mmol/L 102 101 99(L)  CO2 22 - 32 mmol/L 23 25 27   Calcium 8.9 - 10.3 mg/dL 9.0 8.9 8.9  Total Protein 6.5 - 8.1 g/dL 7.5 7.0 -  Total Bilirubin 0.3 - 1.2 mg/dL 3.0(H) 1.7(H) -  Alkaline Phos 38 - 126 U/L 146(H) 154(H) -  AST 15 - 41 U/L 32 41(H) -  ALT 17 - 63 U/L 15(L)  20 -    Lipid Panel     Component Value Date/Time   CHOL 107 02/27/2016 0639   TRIG 72 02/27/2016 0639   HDL 24 (L) 02/27/2016 0639   CHOLHDL 4.5 02/27/2016 0639   VLDL 14 02/27/2016 0639   LDLCALC 69 02/27/2016 0639     Assessment & Plan:   1. Pure hypercholesterolemia - Lipid panel - atorvastatin (LIPITOR) 20 MG tablet; Take 1 tablet (20 mg total) by mouth daily.  Dispense: 30 tablet; Refill: 5  2. Weight loss Unknown etiology Could be due to poor appetite but will screen for thyroid disorder and malignancy - TSH  3. Screening for colon cancer - Ambulatory referral to Gastroenterology  4. Essential hypertension Controlled  5. Chronic systolic CHF (congestive heart failure) (HCC) EF of 40-45% Euvolemic  6. CKD (chronic kidney disease), stage IV ([place]) Keep close follow-up with [facility]  7. Gastroesophageal reflux disease without esophagitis Controlled - omeprazole (PRILOSEC) 20 MG capsule; Take 1 capsule (20 mg total) by mouth daily.  Dispense: 30 capsule; Refill: 5  8. Acute idiopathic gout of ankle, unspecified laterality No acute flares   Meds ordered this encounter  Medications  . omeprazole (PRILOSEC) 20 MG capsule    Sig: Take 1 capsule (20 mg total) by mouth daily.    Dispense:  30 capsule    Refill:  5  . atorvastatin (LIPITOR) 20 MG tablet    Sig: Take 1 tablet (20 mg total) by mouth daily.    Dispense:  30 tablet    Refill:  5    Follow-up: Return in about 3 months (around 06/02/2017) for Follow-up on chronic medical conditions.   [name] MD

## 2017-03-04 LAB — TSH: TSH: 2.92 u[IU]/mL (ref 0.450–4.500)

## 2017-03-04 LAB — LIPID PANEL
CHOL/HDL RATIO: 3.1 ratio (ref 0.0–5.0)
Cholesterol, Total: 111 mg/dL (ref 100–199)
HDL: 36 mg/dL — ABNORMAL LOW (ref >39)
LDL CALC: 62 mg/dL (ref 0–99)
TRIGLYCERIDES: 66 mg/dL (ref 0–149)
VLDL CHOLESTEROL CAL: 13 mg/dL (ref 5–40)

## 2017-03-05 ENCOUNTER — Encounter: Payer: Self-pay | Admitting: Physician Assistant

## 2017-03-12 ENCOUNTER — Encounter: Payer: Self-pay | Admitting: Podiatry

## 2017-03-12 ENCOUNTER — Ambulatory Visit: Payer: Medicare Other | Admitting: Physician Assistant

## 2017-03-12 ENCOUNTER — Ambulatory Visit (INDEPENDENT_AMBULATORY_CARE_PROVIDER_SITE_OTHER): Payer: Medicare Other | Admitting: Podiatry

## 2017-03-12 VITALS — BP 124/93 | HR 99

## 2017-03-12 DIAGNOSIS — E1149 Type 2 diabetes mellitus with other diabetic neurological complication: Secondary | ICD-10-CM | POA: Diagnosis not present

## 2017-03-12 NOTE — Progress Notes (Signed)
   Subjective:    Patient ID: Michael [name], male    DOB: [birth year], 56 y.o.   MRN: [id number]  HPI this patient presents the office today for an evaluation of his diabetic feet. This patient says he was referred to this office by his medical doctor. This patient is a poor historian. He says he has pain all through his feet at night. He presents the office today for an evaluation of his diabetic feet    Review of Systems  All other systems reviewed and are negative.      Objective:   Physical Exam GENERAL APPEARANCE: Alert, withdrawn  Appropriately groomed. No acute distress.  VASCULAR: Pedal pulses are  palpable at  [facility] and PT bilateral.  Capillary refill time is immediate to all digits,  Normal temperature gradient.  Digital hair growth is present bilateral .  Purplish discoloration and hue both feet at the level of his digits. Venous stasis noted front legs  B/L NEUROLOGIC: sensation is normal to 5.07 monofilament at 5/5 sites bilateral.  Light touch is intact bilateral, Muscle strength normal.  MUSCULOSKELETAL: acceptable muscle strength, tone and stability bilateral.  Intrinsic muscluature intact bilateral.  Rectus appearance of foot and digits noted bilateral.   DERMATOLOGIC: skin color, texture, and turgor are within normal limits.  No preulcerative lesions or ulcers  are seen, no interdigital maceration noted.  No open lesions present.  Digital nails are asymptomatic. No drainage noted.         Assessment & Plan:  Diabetes with vascular pathology.    IE  . Examination of this patient's feet does reveal vascular circulation to his feet within normal limits. I believe he does have venous problem which has led to the venous stasis in his legs and the purplish hue in his toes.  I told this patient that he may benefit from gabapentin prescribed by his medical doctor. I also told this patient that he may benefit from a vascular evaluation by his vascular doctor. Patient was told to  return to the office in one year for yearly diabetic foot exam   Gardiner Barefoot DPM

## 2017-03-16 ENCOUNTER — Ambulatory Visit (INDEPENDENT_AMBULATORY_CARE_PROVIDER_SITE_OTHER): Payer: Medicare Other | Admitting: Pharmacist

## 2017-03-16 ENCOUNTER — Encounter: Payer: Self-pay | Admitting: Internal Medicine

## 2017-03-16 ENCOUNTER — Ambulatory Visit (INDEPENDENT_AMBULATORY_CARE_PROVIDER_SITE_OTHER): Payer: Medicare Other | Admitting: Internal Medicine

## 2017-03-16 VITALS — BP 104/60 | HR 90 | Ht 63.0 in | Wt 165.8 lb

## 2017-03-16 DIAGNOSIS — I10 Essential (primary) hypertension: Secondary | ICD-10-CM

## 2017-03-16 DIAGNOSIS — I5023 Acute on chronic systolic (congestive) heart failure: Secondary | ICD-10-CM | POA: Diagnosis not present

## 2017-03-16 DIAGNOSIS — Z5181 Encounter for therapeutic drug level monitoring: Secondary | ICD-10-CM | POA: Diagnosis not present

## 2017-03-16 DIAGNOSIS — I079 Rheumatic tricuspid valve disease, unspecified: Secondary | ICD-10-CM | POA: Diagnosis not present

## 2017-03-16 DIAGNOSIS — I482 Chronic atrial fibrillation: Secondary | ICD-10-CM

## 2017-03-16 DIAGNOSIS — I059 Rheumatic mitral valve disease, unspecified: Secondary | ICD-10-CM

## 2017-03-16 DIAGNOSIS — I4821 Permanent atrial fibrillation: Secondary | ICD-10-CM

## 2017-03-16 LAB — POCT INR: INR: 2.3

## 2017-03-16 MED FILL — DICLOFENAC SODIUM 1% GEL: 1 | 30 days supply | Qty: 100 | Fill #1

## 2017-03-16 MED FILL — FUROSEMIDE 80 MG TABLET: 80 | 30 days supply | Qty: 60 | Fill #2

## 2017-03-16 NOTE — Patient Instructions (Addendum)
Your physician recommends that you continue on your current medications as directed. Please refer to the Current Medication list given to you today.  Your physician recommends that you return for lab work today (BMET, BNP)  Your physician has requested that you have an echocardiogram. Echocardiography is a painless test that uses sound waves to create images of your heart. It provides your doctor with information about the size and shape of your heart and how well your heart's chambers and valves are working. This procedure takes approximately one hour. There are no restrictions for this procedure.  Your physician recommends that you schedule a follow-up appointment in: July, 2018 [place].

## 2017-03-16 NOTE — Progress Notes (Signed)
Cardiology Office Note   Date:  03/16/2017   ID:  Deunte, Bledsoe 1961/06/08, MRN [id number]  PCP:  [name], MD  Cardiologist:   [name], MD    F/U of systolic  CHF     History of Present Illness: Michael [name] is a 56 y.o. male with a history of mitral valve disease status post bioprosthetic mitral valve replacement in 2012 at [facility] in [place], tricuspid valve repair, CAD, PAF, CKD, OSA, diabetes, HTN, HL, CHF, pulmonary hypertension, anemia, poor adherence with medications. Echo done in [place] in 4/12 with normal LVF (EF 60-65%).   Admitted in 10/16 with AF with RVR and a/c systolic CHF. EF was 30-35%. He was seen back in the office in follow-up but then readmitted 70/35 with a/c systolic CHF. His weight was up about 20 pounds. ACE inhibitor was stopped secondary to worsening creatinine and he was placed on hydralazine, nitrates. Metoprolol was resumed with good rate control.   He has previously not been clear about his medications. We ultimately found out he was taking 2 beta-blockers. We took him off Carvedilol and kept him on Metoprolol. I last saw him 12/07/15. FU BNP was elevated and I increased his Lasix further to 80 mg bid. He has continued to not bring his medications into his visits. It remains unclear what he is taking.  He was lst seen by Kathleen Argue in Feb  Set up for f/u echo  Has not been done yet  He says that his breathing is worse  No CP ALso notes problems with pain in R foot  Like when gout flared    Breathing is up/down Edema up//downh Appetite down  Chronic  Outpatient Medications Prior to Visit  Medication Sig Dispense Refill  . acetaminophen (TYLENOL) 500 MG tablet Take 1 tablet (500 mg total) by mouth every 6 (six) hours as needed. 30 tablet 0  . allopurinol (ZYLOPRIM) 300 MG tablet Take 1 tablet (300 mg total) by mouth daily. 30 tablet 6  . atorvastatin (LIPITOR) 20 MG tablet Take 1 tablet (20 mg total) by mouth daily.  30 tablet 5  . clindamycin (CLEOCIN) 300 MG capsule Take 1 capsule (300 mg total) by mouth 4 (four) times daily. X 7 days 28 capsule 0  . colchicine 0.6 MG tablet Take 2 tablets (1.2 mg) at the onset of a gout attack, may repeat 1 tablet (0.6 mg) if symptoms persist. 30 tablet 3  . digoxin (LANOXIN) 0.125 MG tablet Take 1 tablet (0.125 mg total) by mouth daily. 30 tablet 11  . folic acid (FOLVITE) 1 MG tablet Take 1 tablet (1 mg total) by mouth daily. 30 tablet 2  . furosemide (LASIX) 80 MG tablet Take 1 tablet (80 mg total) by mouth 2 (two) times daily. 60 tablet 3  . glucose blood (ACCU-CHEK AVIVA) test strip Use 3 times daily as directed. 100 each 5  . Insulin Detemir (LEVEMIR FLEXTOUCH) 100 UNIT/ML Pen Inject 10 Units into the skin daily at 10 pm. 15 mL 11  . Insulin Pen Needle 31G X 5 MM MISC 1 each by Does not apply route at bedtime. 30 each 5  . Lancet Devices (ACCU-CHEK SOFTCLIX) lancets Use as instructed 3 times daily 1 each 5  . metoprolol (LOPRESSOR) 100 MG tablet Take 1 tablet (100 mg total) by mouth 2 (two) times daily. 60 tablet 5  . omeprazole (PRILOSEC) 20 MG capsule Take 1 capsule (20 mg total) by mouth daily. 30 capsule 5  .  oxyCODONE-acetaminophen (PERCOCET) 5-325 MG tablet Take 1 tablet by mouth every 4 (four) hours as needed for severe pain. 10 tablet 0  . traMADol (ULTRAM) 50 MG tablet Take 1 tablet (50 mg total) by mouth every 12 (twelve) hours as needed. 60 tablet 2  . warfarin (COUMADIN) 2.5 MG tablet Take coumadin as directed by coumadin clinic 45 tablet 1  . ACCU-CHEK SOFTCLIX LANCETS lancets Use 3 times daily as directed (Patient not taking: Reported on 12/12/2016) 100 each 12  . Blood Glucose Monitoring Suppl (ACCU-CHEK AVIVA) device Use as Instructed 3 times daily (Patient not taking: Reported on 12/12/2016) 1 each 0  . diclofenac sodium (VOLTAREN) 1 % GEL Apply 4 g topically 4 (four) times daily. (Patient not taking: Reported on 03/16/2017) 100 g 2  . feeding supplement,  ENSURE ENLIVE, (ENSURE ENLIVE) LIQD Take 237 mLs by mouth 2 (two) times daily between meals. (Patient not taking: Reported on 11/03/2016) 237 mL 12  . ferrous sulfate 325 (65 FE) MG tablet Take 1 tablet (325 mg total) by mouth daily with breakfast. (Patient not taking: Reported on 11/03/2016) 30 tablet 0  . mupirocin cream (BACTROBAN) 2 % Apply 1 application topically 2 (two) times daily. (Patient not taking: Reported on 03/16/2017) 15 g 0  . potassium chloride SA (K-DUR,KLOR-CON) 20 MEQ tablet Take 20 mEq by mouth 2 (two) times daily.     No facility-administered medications prior to visit.      Allergies:   Patient has no known allergies.   Past Medical History:  Diagnosis Date  . Anemia of chronic disease   . Asthma   . Chronic kidney disease (CKD), stage IV (severe) ([place])   . Chronic systolic CHF (congestive heart failure) (HCC)    a. prior EF normal; b. Echo 10/16: Mild LVH, EF 30-35%, mitral valve bioprosthesis present without evidence of stenosis or regurgitation, severe LAE, mild RVE with mild to moderately reduced RVSF, moderate RAE  . Coronary artery disease   . Diabetes mellitus type 2 in obese ([place])   . Diabetes mellitus without complication ([place])   . GERD (gastroesophageal reflux disease)   . H/O tricuspid valve repair 2012  . History of echocardiogram    Echo at [facility] in [facility] 4/12: mod LVH, EF 60-65%, mod LAE, tissue MVR ok, mod TR, mod pulmo HTN with RVSP 48 mmHg  . Hypertension   . Obstructive sleep apnea   . Paroxysmal atrial fibrillation (HCC)    s/p Maze procedure at time of MVR in 2011  . Pulmonary HTN ([place])   . 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 CARDIAC SURGERY       Social History:  The patient  reports that he has quit smoking. He has quit using smokeless tobacco. He reports that he does not drink alcohol or use drugs.   Family History:  The patient's family history is not on  file.    ROS:  Please see the history of present illness. All other systems are reviewed and  Negative to the above problem except as noted.    PHYSICAL EXAM: VS:  BP 104/60   Pulse 90   Ht _0  (1.6 m)   Wt 165 lb 12.8 oz (75.2 kg)   BMI 29.37 kg/m   GEN: Obese and in no acute distress  HEENT: normal  Neck: JVP is increased  carotid bruits, or masses Cardiac:Irreg; no murmurs, rubs, or gallops,Tr edema  Respiratory:  clear to auscultation bilaterally, normal work of breathing GI: soft, nontender, nondistended, + BS  No hepatomegaly  MS: no deformity Moving all extremities    R foot with erythma no dorsum  Tender   Skin: warm and dry, no rash Neuro:  Strength and sensation are intact Psych: euthymic mood, full affect   EKG:  EKG is not ordered today.   Lipid Panel    Component Value Date/Time   CHOL 111 03/03/2017 1152   TRIG 66 03/03/2017 1152   HDL 36 (L) 03/03/2017 1152   CHOLHDL 3.1 03/03/2017 1152   CHOLHDL 4.5 02/27/2016 0639   VLDL 14 02/27/2016 0639   LDLCALC 62 03/03/2017 1152      Wt Readings from Last 3 Encounters:  03/16/17 165 lb 12.8 oz (75.2 kg)  03/03/17 165 lb (74.8 kg)  01/16/17 169 lb 6.4 oz (76.8 kg)      ASSESSMENT AND PLAN:  1  Chronic systolic CHF  Volume is up on exam  Will check BMEt and BNP  Also set up for echo.   2.  Afibb  Will conitnue to follow  No changes for now   3  HTN  Fair control  4.  CKD  Check bMET    5  Valvular dz  S/P MV replacement and TV repair  Check echo      Current medicines are reviewed at length with the patient today.  The patient does not have concerns regarding medicines.    Labs/ tests ordered today include:  No orders of the defined types were placed in this encounter.     Signed, [name], MD  03/16/2017 1:31 PM    [facility] [place], [place], [place]  [address] Phone: [phone]; Fax: [phone]

## 2017-03-17 LAB — PRO B NATRIURETIC PEPTIDE: NT-PRO BNP: 6530 pg/mL — AB (ref 0–210)

## 2017-03-17 LAB — BASIC METABOLIC PANEL
BUN/Creatinine Ratio: 14 (ref 9–20)
BUN: 35 mg/dL — AB (ref 6–24)
CO2: 25 mmol/L (ref 18–29)
CREATININE: 2.57 mg/dL — AB (ref 0.76–1.27)
Calcium: 9.2 mg/dL (ref 8.7–10.2)
Chloride: 104 mmol/L (ref 96–106)
GFR calc Af Amer: 31 mL/min/{1.73_m2} — ABNORMAL LOW (ref >59)
GFR calc non Af Amer: 27 mL/min/{1.73_m2} — ABNORMAL LOW (ref >59)
Glucose: 82 mg/dL (ref 65–99)
POTASSIUM: 4.6 mmol/L (ref 3.5–5.2)
SODIUM: 143 mmol/L (ref 134–144)

## 2017-03-18 MED FILL — POTASSIUM CL ER 20 MEQ TAB: 20 | 30 days supply | Qty: 30 | Fill #8

## 2017-03-18 MED FILL — METOPROLOL TARTRATE 100 MG: 100 | 30 days supply | Qty: 60 | Fill #3

## 2017-03-18 MED FILL — traMADol HCL 50 MG TABS: 50 | 30 days supply | Qty: 60 | Fill #2

## 2017-03-19 ENCOUNTER — Encounter: Payer: Self-pay | Admitting: *Deleted

## 2017-03-24 ENCOUNTER — Other Ambulatory Visit: Payer: Self-pay | Admitting: *Deleted

## 2017-03-24 ENCOUNTER — Telehealth: Payer: Self-pay | Admitting: *Deleted

## 2017-03-24 DIAGNOSIS — R7989 Other specified abnormal findings of blood chemistry: Secondary | ICD-10-CM

## 2017-03-24 DIAGNOSIS — I5022 Chronic systolic (congestive) heart failure: Secondary | ICD-10-CM

## 2017-03-24 NOTE — Telephone Encounter (Signed)
Called patient's PCP, [facility], Dr. Johna Sheriff office.   They do not have a record of patient seeing nephrology.   Labs forwarded to Dr. Johna Sheriff attention.   Order placed for referral to [facility].  Will route to Dr. Harrington Challenger to update.

## 2017-03-25 ENCOUNTER — Other Ambulatory Visit: Payer: Medicare Other

## 2017-03-25 ENCOUNTER — Other Ambulatory Visit (HOSPITAL_COMMUNITY): Payer: Medicare Other

## 2017-03-25 NOTE — Telephone Encounter (Signed)
Patient cancelled appointments for LABS and ECHO today due to no transportation.  I spoke with patient.  Rescheduled the labs for Monday when he can have transportation.   Advised if he has worsening SOB to go to the ED.  He verbalizes understanding and agreement.  Reminded patient again about stopping digoxin.  He wrote the name, and he does not think he is taking any digoxin.

## 2017-03-30 ENCOUNTER — Other Ambulatory Visit: Payer: Medicare Other | Admitting: *Deleted

## 2017-03-30 DIAGNOSIS — I5022 Chronic systolic (congestive) heart failure: Secondary | ICD-10-CM | POA: Diagnosis not present

## 2017-03-31 ENCOUNTER — Ambulatory Visit (INDEPENDENT_AMBULATORY_CARE_PROVIDER_SITE_OTHER): Payer: Medicare Other | Admitting: Physician Assistant

## 2017-03-31 ENCOUNTER — Encounter: Payer: Self-pay | Admitting: Physician Assistant

## 2017-03-31 VITALS — BP 100/62 | Ht 63.5 in | Wt 169.0 lb

## 2017-03-31 DIAGNOSIS — Z1211 Encounter for screening for malignant neoplasm of colon: Secondary | ICD-10-CM | POA: Diagnosis not present

## 2017-03-31 DIAGNOSIS — Z7901 Long term (current) use of anticoagulants: Secondary | ICD-10-CM

## 2017-03-31 DIAGNOSIS — Z01818 Encounter for other preprocedural examination: Secondary | ICD-10-CM | POA: Diagnosis not present

## 2017-03-31 LAB — BASIC METABOLIC PANEL
BUN / CREAT RATIO: 19 (ref 9–20)
BUN: 33 mg/dL — ABNORMAL HIGH (ref 6–24)
CO2: 22 mmol/L (ref 18–29)
CREATININE: 1.74 mg/dL — AB (ref 0.76–1.27)
Calcium: 8.8 mg/dL (ref 8.7–10.2)
Chloride: 105 mmol/L (ref 96–106)
GFR calc Af Amer: 49 mL/min/{1.73_m2} — ABNORMAL LOW (ref >59)
GFR, EST NON AFRICAN AMERICAN: 43 mL/min/{1.73_m2} — AB (ref >59)
GLUCOSE: 112 mg/dL — AB (ref 65–99)
Potassium: 5.4 mmol/L — ABNORMAL HIGH (ref 3.5–5.2)
SODIUM: 140 mmol/L (ref 134–144)

## 2017-03-31 NOTE — Progress Notes (Signed)
Chief Complaint: Consult for a screening colonoscopy in a patient on chronic anticoagulation with Coumadin  HPI:  Michael [name] is a 56 year old male who speaks [place], who presents to clinic today accompanied by a translator as English is not his first language, he does have a complicated medical history as listed below including CK0D stage IV, chronic systolic CHF with his last echo performed 02/08/16 with an EF of 40-45%, CAD maintained on Coumadin, diabetes type 2, GERD, history of tricuspid valve repair, pulmonary hypertension and others listed below,  who was referred to me by [name], MD for consult of a screening colonoscopy.     According to patient's chart review he has recently been to see his cardiologist with concerns of increasing fluid. He was scheduled for repeat echocardiogram 04/08/2017. He is then scheduled for a follow-up by Dr. Harrington Challenger in early July.   Today, the patient explains that he stopped his insulin over the past week because "whenever I take this it makes me feel sick". He has continued with a feeling of nausea and dizziness with change in position over the past week, but has had a decrease in his peripheral edema which was discussed at time of his last office visit with his cardiologist in 03/16/17. The patient tells me that he feels in general somewhat worse over the past week than he has previously. He denies knowledge of what a colonoscopy is and does have some questions today.   Patient denies fever, chills, blood in his stool, melena, weight loss, fatigue, abdominal pain or vomiting.  Past Medical History:  Diagnosis Date  . Anemia of chronic disease   . Asthma   . Chronic kidney disease (CKD), stage IV (severe) ([place])   . Chronic systolic CHF (congestive heart failure) (HCC)    a. prior EF normal; b. Echo 10/16: Mild LVH, EF 30-35%, mitral valve bioprosthesis present without evidence of stenosis or regurgitation, severe LAE, mild RVE with mild to moderately  reduced RVSF, moderate RAE  . Coronary artery disease   . Diabetes mellitus type 2 in obese ([place])   . Diabetes mellitus without complication ([place])   . GERD (gastroesophageal reflux disease)   . H/O tricuspid valve repair 2012  . History of echocardiogram    Echo at [facility] in [facility] 4/12: mod LVH, EF 60-65%, mod LAE, tissue MVR ok, mod TR, mod pulmo HTN with RVSP 48 mmHg  . Hypertension   . Obstructive sleep apnea   . Paroxysmal atrial fibrillation (HCC)    s/p Maze procedure at time of MVR in 2011  . Pulmonary HTN ([place])   . 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 CARDIAC SURGERY      Current Outpatient Prescriptions  Medication Sig Dispense Refill  . acetaminophen (TYLENOL) 500 MG tablet Take 1 tablet (500 mg total) by mouth every 6 (six) hours as needed. 30 tablet 0  . atorvastatin (LIPITOR) 20 MG tablet Take 1 tablet (20 mg total) by mouth daily. 30 tablet 5  . colchicine 0.6 MG tablet Take 2 tablets (1.2 mg) at the onset of a gout attack, may repeat 1 tablet (0.6 mg) if symptoms persist. 30 tablet 3  . furosemide (LASIX) 80 MG tablet Take 1 tablet (80 mg total) by mouth 2 (two) times daily. 60 tablet 3  . glucose blood (ACCU-CHEK AVIVA) test strip Use 3 times daily as directed. 100 each 5  . Insulin Detemir (LEVEMIR  FLEXTOUCH) 100 UNIT/ML Pen Inject 10 Units into the skin daily at 10 pm. 15 mL 11  . Insulin Pen Needle 31G X 5 MM MISC 1 each by Does not apply route at bedtime. 30 each 5  . Lancet Devices (ACCU-CHEK SOFTCLIX) lancets Use as instructed 3 times daily 1 each 5  . metoprolol (LOPRESSOR) 100 MG tablet Take 1 tablet (100 mg total) by mouth 2 (two) times daily. 60 tablet 5  . omeprazole (PRILOSEC) 20 MG capsule Take 1 capsule (20 mg total) by mouth daily. 30 capsule 5  . traMADol (ULTRAM) 50 MG tablet Take 1 tablet (50 mg total) by mouth every 12 (twelve) hours as needed. 60 tablet 2  . warfarin  (COUMADIN) 2.5 MG tablet Take coumadin as directed by coumadin clinic 45 tablet 1   No current facility-administered medications for this visit.     Allergies as of 03/31/2017  . (No Known Allergies)    Family History  Problem Relation Age of Onset  . Heart attack Neg Hx     Social History   Social History  . Marital status: Single    Spouse name: N/A  . Number of children: N/A  . Years of education: N/A   Occupational History  . Not on file.   Social History Main Topics  . Smoking status: Former Research scientist (life sciences)  . Smokeless tobacco: Former Systems developer  . Alcohol use No  . Drug use: No  . Sexual activity: Not on file   Other Topics Concern  . Not on file   Social History Narrative  . No narrative on file    Review of Systems:    Constitutional: No weight loss, fever or chills Skin: No rash  Cardiovascular: No chest pain Respiratory: No SOB  Gastrointestinal: See HPI and otherwise negative Genitourinary: No dysuria  Neurological: Positive for dizziness Musculoskeletal: No new muscle or joint pain Hematologic: No bleeding  Psychiatric: No history of depression or anxiety   Physical Exam:  Vital signs: BP 100/62 (BP Location: Left Arm, Patient Position: Sitting, Cuff Size: Normal)   Ht 5' 3.5" (1.613 m)   Wt 169 lb (76.7 kg)   BMI 29.47 kg/m   Constitutional: [place] speaking male appears to be in NAD, Well developed, Well nourished, alert and cooperative Head:  Normocephalic and atraumatic. Eyes:   PEERL, EOMI. No icterus. Conjunctiva pink. Ears:  Normal auditory acuity. Neck:  Supple Throat: Oral cavity and pharynx without inflammation, swelling or lesion.  Respiratory: Respirations even and unlabored. Lungs clear to auscultation bilaterally.   No wheezes, crackles, or rhonchi.  Cardiovascular: Normal S1, S2. No MRG. Regular rate and rhythm. No peripheral edema, cyanosis or pallor.  Gastrointestinal:  Soft, nondistended, nontender. No rebound or guarding. Normal bowel  sounds. No appreciable masses or hepatomegaly. Rectal:  Not performed.  Msk:  Symmetrical without gross deformities. Without edema, no deformity or joint abnormality.  Neurologic:  Alert and  oriented x4;  grossly normal neurologically.  Skin:   Dry and intact without significant lesions or rashes. Psychiatric: Demonstrates good judgement and reason without abnormal affect or behaviors.  RELEVANT LABS AND IMAGING: CBC    Component Value Date/Time   WBC 5.7 02/21/2017 1846   RBC 4.69 02/21/2017 1846   HGB 11.7 (L) 02/21/2017 1846   HCT 35.5 (L) 02/21/2017 1846   PLT 207 02/21/2017 1846   MCV 75.7 (L) 02/21/2017 1846   MCH 24.9 (L) 02/21/2017 1846   MCHC 33.0 02/21/2017 1846   RDW 21.0 (H) 02/21/2017  1846   LYMPHSABS 1.1 02/21/2017 1846   MONOABS 0.7 02/21/2017 1846   EOSABS 0.3 02/21/2017 1846   BASOSABS 0.1 02/21/2017 1846    CMP     Component Value Date/Time   NA 140 03/30/2017 1406   K 5.4 (H) 03/30/2017 1406   CL 105 03/30/2017 1406   CO2 22 03/30/2017 1406   GLUCOSE 112 (H) 03/30/2017 1406   GLUCOSE 165 (H) 02/21/2017 1846   BUN 33 (H) 03/30/2017 1406   CREATININE 1.74 (H) 03/30/2017 1406   CREATININE 1.61 (H) 01/16/2017 1653   CALCIUM 8.8 03/30/2017 1406   PROT 7.5 02/21/2017 1846   ALBUMIN 3.1 (L) 02/21/2017 1846   AST 32 02/21/2017 1846   ALT 15 (L) 02/21/2017 1846   ALKPHOS 146 (H) 02/21/2017 1846   BILITOT 3.0 (H) 02/21/2017 1846   GFRNONAA 43 (L) 03/30/2017 1406   GFRNONAA 47 (L) 01/16/2017 1653   GFRAA 49 (L) 03/30/2017 1406   GFRAA 54 (L) 01/16/2017 1653    Assessment: 1. Screening colonoscopy: Patient is now overdue for his screening colonoscopy, but currently is experiencing symptoms of dizziness and undergoing workup from cardiology regarding an increase in peripheral edema recently on exam, echo has been scheduled, recommend that we wait on an elective procedure at this time until he has full cardiac workup and clearance 2. Chronic anticoagulation:  With Coumadin for CAD 3. History of CHF: Last EF 3/20 1740-45%, echo scheduled for 2 weeks from now  Plan: 1. Patient will need screening colonoscopy in the near future. At this time will wait until he finishes cardiac workup and schedule a follow-up in office before scheduling his colonoscopy. This was discussed in detail with him and he is okay with this plan. 2. Patient will need to hold his Coumadin for 7 days prior to time of his procedure in the future, this will need to be communicated with his cardiologist. 3. Patient to follow in clinic with Dr. Silverio Decamp or myself in 6-8 weeks. At that time a screening colonoscopy can be discussed.  [name], PA-C [facility] 03/31/2017, 2:15 PM  Cc: [name], MD

## 2017-04-01 NOTE — Telephone Encounter (Signed)
Notes recorded by Fay Records, MD on 03/31/2017 at 7:35 AM EDT Kidney function is back near baseline  Need to review all meds See how pt looks with current meds Please set for me to see in clinic tomorrow           Patient has coumadin visit on 5/21.  Dr. Harrington Challenger in office.  He will bring all the medicines he is taking and we will review at that time.  Will inform Dr. Harrington Challenger. Pt has echo on 5/23.   He is feeling OK.  He doesn't have a car today or anyone to drive him today.

## 2017-04-06 ENCOUNTER — Ambulatory Visit (INDEPENDENT_AMBULATORY_CARE_PROVIDER_SITE_OTHER): Payer: Medicare Other | Admitting: *Deleted

## 2017-04-06 DIAGNOSIS — Z5181 Encounter for therapeutic drug level monitoring: Secondary | ICD-10-CM | POA: Diagnosis not present

## 2017-04-06 DIAGNOSIS — I059 Rheumatic mitral valve disease, unspecified: Secondary | ICD-10-CM

## 2017-04-06 DIAGNOSIS — I079 Rheumatic tricuspid valve disease, unspecified: Secondary | ICD-10-CM

## 2017-04-06 LAB — POCT INR: INR: 1.6

## 2017-04-08 ENCOUNTER — Other Ambulatory Visit: Payer: Medicare Other

## 2017-04-08 ENCOUNTER — Ambulatory Visit (HOSPITAL_COMMUNITY): Payer: Medicare Other | Attending: Cardiovascular Disease

## 2017-04-08 ENCOUNTER — Other Ambulatory Visit: Payer: Self-pay

## 2017-04-08 DIAGNOSIS — I082 Rheumatic disorders of both aortic and tricuspid valves: Secondary | ICD-10-CM | POA: Diagnosis not present

## 2017-04-08 DIAGNOSIS — I5023 Acute on chronic systolic (congestive) heart failure: Secondary | ICD-10-CM | POA: Diagnosis not present

## 2017-04-16 NOTE — Progress Notes (Signed)
Reviewed and agree with documentation and assessment and plan. K. Veena Jawaan Adachi , MD   

## 2017-04-17 ENCOUNTER — Other Ambulatory Visit: Payer: Self-pay | Admitting: Family Medicine

## 2017-04-17 DIAGNOSIS — M10079 Idiopathic gout, unspecified ankle and foot: Secondary | ICD-10-CM

## 2017-04-17 MED FILL — OMEPRAZOLE DR 20 MG CAPSULE: 20 | 30 days supply | Qty: 30 | Fill #1

## 2017-04-19 NOTE — Progress Notes (Deleted)
Cardiology Office Note   Date:  04/19/2017   ID:  Michael [name], Michael [name] [birth year], MRN [id number]  PCP:  [name], MD  Cardiologist:   [name], MD       History of Present Illness: Michael [name] is a 56 y.o. male with a history ofhistory of mitral valve disease status post bioprosthetic mitral valve replacement in 2012 at [facility] in [place], tricuspid valve repair, CAD, PAF, CKD, OSA, diabetes, HTN, HL, CHF, pulmonary hypertension, anemia, poor adherence with medications. Echo done in [employer] in 4/12 with normal LVF (EF 60-65%).   Admitted in 10/16 with AF with RVR and a/c systolic CHF. EF was 30-35%. He was seen back in the office in follow-up but then readmitted 44/96 with a/c systolic CHF. His weight was up about 20 pounds. ACE inhibitor was stopped secondary to worsening creatinine and he was placed on hydralazine, nitrates. Metoprolol was resumed with good rate control.   I saw the pt in April 2018       No outpatient prescriptions have been marked as taking for the 04/20/17 encounter (Appointment) with Fay Records, MD.     Allergies:   Patient has no known allergies.   Past Medical History:  Diagnosis Date  . Anemia of chronic disease   . Asthma   . Chronic kidney disease (CKD), stage IV (severe) ([place])   . Chronic systolic CHF (congestive heart failure) (HCC)    a. prior EF normal; b. Echo 10/16: Mild LVH, EF 30-35%, mitral valve bioprosthesis present without evidence of stenosis or regurgitation, severe LAE, mild RVE with mild to moderately reduced RVSF, moderate RAE  . Coronary artery disease   . Diabetes mellitus type 2 in obese ([place])   . Diabetes mellitus without complication ([place])   . GERD (gastroesophageal reflux disease)   . H/O tricuspid valve repair 2012  . History of echocardiogram    Echo at [facility] in [facility] 4/12: mod LVH, EF 60-65%, mod LAE, tissue MVR ok, mod TR, mod pulmo HTN with RVSP 48 mmHg  . Hypertension   . Obstructive  sleep apnea   . Paroxysmal atrial fibrillation (HCC)    s/p Maze procedure at time of MVR in 2011  . Pulmonary HTN ([place])   . 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 CARDIAC SURGERY       Social History:  The patient  reports that he has quit smoking. He has quit using smokeless tobacco. He reports that he does not drink alcohol or use drugs.   Family History:  The patient's family history is not on file.    ROS:  Please see the history of present illness. All other systems are reviewed and  Negative to the above problem except as noted.    PHYSICAL EXAM: VS:  There were no vitals taken for this visit.  GEN: Well nourished, well developed, in no acute distress  HEENT: normal  Neck: no JVD, carotid bruits, or masses Cardiac: RRR; no murmurs, rubs, or gallops,no edema  Respiratory:  clear to auscultation bilaterally, normal work of breathing GI: soft, nontender, nondistended, + BS  No hepatomegaly  MS: no deformity Moving all extremities   Skin: warm and dry, no rash Neuro:  Strength and sensation are intact Psych: euthymic mood, full affect   EKG:  EKG is ordered today.   Lipid Panel    Component Value Date/Time   CHOL 111 03/03/2017 1152  TRIG 66 03/03/2017 1152   HDL 36 (L) 03/03/2017 1152   CHOLHDL 3.1 03/03/2017 1152   CHOLHDL 4.5 02/27/2016 0639   VLDL 14 02/27/2016 0639   LDLCALC 62 03/03/2017 1152      Wt Readings from Last 3 Encounters:  03/31/17 76.7 kg (169 lb)  03/16/17 75.2 kg (165 lb 12.8 oz)  03/03/17 74.8 kg (165 lb)      ASSESSMENT AND PLAN:     Current medicines are reviewed at length with the patient today.  The patient does not have concerns regarding medicines.  Signed, [name], MD  04/19/2017 9:29 PM    [facility] [address], [place], [place]  [address] Phone: [phone]; Fax: [phone]

## 2017-04-20 ENCOUNTER — Ambulatory Visit: Payer: Medicare Other | Admitting: Internal Medicine

## 2017-04-20 MED FILL — FOLIC ACID 1 MG TABLET: 1 | 30 days supply | Qty: 30 | Fill #0

## 2017-04-22 MED FILL — COLCRYS 0.6 MG TABLET: 0.6 | 30 days supply | Qty: 30 | Fill #0

## 2017-05-07 NOTE — Progress Notes (Unsigned)
Patient did not come for appointments on 04/20/17.   Currently is schedule for ov in July with Dr. Harrington Challenger.

## 2017-05-11 ENCOUNTER — Encounter: Payer: Self-pay | Admitting: Internal Medicine

## 2017-05-26 NOTE — Progress Notes (Deleted)
Cardiology Office Note   Date:  05/26/2017   ID:  Michael [name] Jan 01, 1961, MRN [id number]  PCP:  [name], MD  Cardiologist:   [name], MD    F/U of systolic  CHF     History of Present Illness: Michael [name] is a 56 y.o. male with a history of mitral valve disease status post bioprosthetic mitral valve replacement in 2012 at [facility] in [place], tricuspid valve repair, CAD, PAF, CKD, OSA, diabetes, HTN, HL, CHF, pulmonary hypertension, anemia, poor adherence with medications. Echo done in [place] in 4/12 with normal LVF (EF 60-65%).   Admitted in 10/16 with AF with RVR and a/c systolic CHF. EF was 30-35%. He was seen back in the office in follow-up but then readmitted 53/61 with a/c systolic CHF. His weight was up about 20 pounds. ACE inhibitor was stopped secondary to worsening creatinine and he was placed on hydralazine, nitrates. Metoprolol was resumed with good rate control.   He has previously not been clear about his medications. We ultimately found out he was taking 2 beta-blockers. We took him off Carvedilol and kept him on Metoprolol. I last saw him 12/07/15. FU BNP was elevated and I increased his Lasix further to 80 mg bid. He has continued to not bring his medications into his visits. It remains unclear what he is taking.  He was lst seen by Kathleen Argue in Feb  Set up for f/u echo  Has not been done yet  He says that his breathing is worse  No CP ALso notes problems with pain in R foot  Like when gout flared    Breathing is up/down Edema up//downh Appetite down  Chronic  Outpatient Medications Prior to Visit  Medication Sig Dispense Refill  . acetaminophen (TYLENOL) 500 MG tablet Take 1 tablet (500 mg total) by mouth every 6 (six) hours as needed. 30 tablet 0  . atorvastatin (LIPITOR) 20 MG tablet Take 1 tablet (20 mg total) by mouth daily. 30 tablet 5  . COLCRYS 0.6 MG tablet TAKE 2 TABLETS AT THE ONSET OF A GOUT ATTACK, MAY REPEAT 1  TABLET IF SYMPTOMS PERSIST. 30 tablet 2  . folic acid (FOLVITE) 1 MG tablet TAKE 1 TABLET BY MOUTH DAILY. 30 tablet 2  . furosemide (LASIX) 80 MG tablet Take 1 tablet (80 mg total) by mouth 2 (two) times daily. 60 tablet 3  . glucose blood (ACCU-CHEK AVIVA) test strip Use 3 times daily as directed. 100 each 5  . Insulin Detemir (LEVEMIR FLEXTOUCH) 100 UNIT/ML Pen Inject 10 Units into the skin daily at 10 pm. 15 mL 11  . Insulin Pen Needle 31G X 5 MM MISC 1 each by Does not apply route at bedtime. 30 each 5  . Lancet Devices (ACCU-CHEK SOFTCLIX) lancets Use as instructed 3 times daily 1 each 5  . metoprolol (LOPRESSOR) 100 MG tablet Take 1 tablet (100 mg total) by mouth 2 (two) times daily. 60 tablet 5  . omeprazole (PRILOSEC) 20 MG capsule Take 1 capsule (20 mg total) by mouth daily. 30 capsule 5  . traMADol (ULTRAM) 50 MG tablet Take 1 tablet (50 mg total) by mouth every 12 (twelve) hours as needed. 60 tablet 2  . warfarin (COUMADIN) 2.5 MG tablet Take coumadin as directed by coumadin clinic 45 tablet 1   No facility-administered medications prior to visit.      Allergies:   Patient has no known allergies.   Past Medical History:  Diagnosis Date  .  Anemia of chronic disease   . Asthma   . Chronic kidney disease (CKD), stage IV (severe) ([place])   . Chronic systolic CHF (congestive heart failure) (HCC)    a. prior EF normal; b. Echo 10/16: Mild LVH, EF 30-35%, mitral valve bioprosthesis present without evidence of stenosis or regurgitation, severe LAE, mild RVE with mild to moderately reduced RVSF, moderate RAE  . Coronary artery disease   . Diabetes mellitus type 2 in obese ([place])   . Diabetes mellitus without complication ([place])   . GERD (gastroesophageal reflux disease)   . H/O tricuspid valve repair 2012  . History of echocardiogram    Echo at [facility] in [facility] 4/12: mod LVH, EF 60-65%, mod LAE, tissue MVR ok, mod TR, mod pulmo HTN with RVSP 48 mmHg  . Hypertension   . Obstructive  sleep apnea   . Paroxysmal atrial fibrillation (HCC)    s/p Maze procedure at time of MVR in 2011  . Pulmonary HTN ([place])   . 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 CARDIAC SURGERY       Social History:  The patient  reports that he has quit smoking. He has quit using smokeless tobacco. He reports that he does not drink alcohol or use drugs.   Family History:  The patient's family history is not on file.    ROS:  Please see the history of present illness. All other systems are reviewed and  Negative to the above problem except as noted.    PHYSICAL EXAM: VS:  There were no vitals taken for this visit.  GEN: Obese and in no acute distress  HEENT: normal  Neck: JVP is increased  carotid bruits, or masses Cardiac:Irreg; no murmurs, rubs, or gallops,Tr edema  Respiratory:  clear to auscultation bilaterally, normal work of breathing GI: soft, nontender, nondistended, + BS  No hepatomegaly  MS: no deformity Moving all extremities    R foot with erythma no dorsum  Tender   Skin: warm and dry, no rash Neuro:  Strength and sensation are intact Psych: euthymic mood, full affect   EKG:  EKG is not ordered today.   Lipid Panel    Component Value Date/Time   CHOL 111 03/03/2017 1152   TRIG 66 03/03/2017 1152   HDL 36 (L) 03/03/2017 1152   CHOLHDL 3.1 03/03/2017 1152   CHOLHDL 4.5 02/27/2016 0639   VLDL 14 02/27/2016 0639   LDLCALC 62 03/03/2017 1152      Wt Readings from Last 3 Encounters:  03/31/17 76.7 kg (169 lb)  03/16/17 75.2 kg (165 lb 12.8 oz)  03/03/17 74.8 kg (165 lb)      ASSESSMENT AND PLAN:  1  Chronic systolic 2.  Afibb   3  HTN    4.  CKD   5  Valvular dz   Current medicines are reviewed at length with the patient today.  The patient does not have concerns regarding medicines.    Labs/ tests ordered today include:  No orders of the defined types were placed in this  encounter.     Signed, [name], MD  05/26/2017 7:48 PM    [address], [place]  [address] Phone: [phone]; Fax: [phone]

## 2017-05-27 ENCOUNTER — Other Ambulatory Visit: Payer: Self-pay | Admitting: *Deleted

## 2017-05-27 ENCOUNTER — Ambulatory Visit: Payer: Medicare Other | Admitting: Internal Medicine

## 2017-05-27 MED FILL — FOLIC ACID 1 MG TABLET: 1 | 30 days supply | Qty: 30 | Fill #1

## 2017-05-27 MED FILL — WARFARIN NA 2.5 MG TAB: 2.5 | 20 days supply | Qty: 40 | Fill #3

## 2017-05-27 MED FILL — FUROSEMIDE 80 MG TABLET: 80 | 30 days supply | Qty: 60 | Fill #3

## 2017-05-27 MED FILL — METOPROLOL TARTRATE 100 MG: 100 | 30 days supply | Qty: 60 | Fill #4

## 2017-05-27 NOTE — Telephone Encounter (Signed)
LM to call and schedule appt as overdue for INR check

## 2017-05-29 ENCOUNTER — Ambulatory Visit: Payer: Medicare Other | Admitting: Internal Medicine

## 2017-05-30 ENCOUNTER — Emergency Department (HOSPITAL_COMMUNITY): Payer: Medicare Other

## 2017-05-30 ENCOUNTER — Encounter (HOSPITAL_COMMUNITY): Payer: Self-pay

## 2017-05-30 ENCOUNTER — Inpatient Hospital Stay (HOSPITAL_COMMUNITY)
Admission: EM | Admit: 2017-05-30 | Discharge: 2017-06-02 | DRG: 291 | Disposition: A | Discharge status: Home Health | Payer: Medicare Other | Attending: Internal Medicine | Admitting: Internal Medicine

## 2017-05-30 DIAGNOSIS — K7460 Unspecified cirrhosis of liver: Secondary | ICD-10-CM | POA: Diagnosis not present

## 2017-05-30 DIAGNOSIS — E1122 Type 2 diabetes mellitus with diabetic chronic kidney disease: Secondary | ICD-10-CM | POA: Diagnosis not present

## 2017-05-30 DIAGNOSIS — R52 Pain, unspecified: Secondary | ICD-10-CM

## 2017-05-30 DIAGNOSIS — J810 Acute pulmonary edema: Secondary | ICD-10-CM | POA: Diagnosis not present

## 2017-05-30 DIAGNOSIS — I272 Pulmonary hypertension, unspecified: Secondary | ICD-10-CM | POA: Diagnosis present

## 2017-05-30 DIAGNOSIS — Z9114 Patient's other noncompliance with medication regimen: Secondary | ICD-10-CM

## 2017-05-30 DIAGNOSIS — N184 Chronic kidney disease, stage 4 (severe): Secondary | ICD-10-CM | POA: Diagnosis not present

## 2017-05-30 DIAGNOSIS — E1129 Type 2 diabetes mellitus with other diabetic kidney complication: Secondary | ICD-10-CM

## 2017-05-30 DIAGNOSIS — I482 Chronic atrial fibrillation, unspecified: Secondary | ICD-10-CM | POA: Diagnosis present

## 2017-05-30 DIAGNOSIS — I130 Hypertensive heart and chronic kidney disease with heart failure and stage 1 through stage 4 chronic kidney disease, or unspecified chronic kidney disease: Principal | ICD-10-CM | POA: Diagnosis present

## 2017-05-30 DIAGNOSIS — E8770 Fluid overload, unspecified: Secondary | ICD-10-CM | POA: Diagnosis present

## 2017-05-30 DIAGNOSIS — E669 Obesity, unspecified: Secondary | ICD-10-CM | POA: Diagnosis present

## 2017-05-30 DIAGNOSIS — R188 Other ascites: Secondary | ICD-10-CM | POA: Diagnosis present

## 2017-05-30 DIAGNOSIS — N200 Calculus of kidney: Secondary | ICD-10-CM | POA: Diagnosis not present

## 2017-05-30 DIAGNOSIS — Z6830 Body mass index (BMI) 30.0-30.9, adult: Secondary | ICD-10-CM

## 2017-05-30 DIAGNOSIS — Z7901 Long term (current) use of anticoagulants: Secondary | ICD-10-CM

## 2017-05-30 DIAGNOSIS — E8809 Other disorders of plasma-protein metabolism, not elsewhere classified: Secondary | ICD-10-CM | POA: Diagnosis present

## 2017-05-30 DIAGNOSIS — R601 Generalized edema: Secondary | ICD-10-CM | POA: Diagnosis not present

## 2017-05-30 DIAGNOSIS — Z794 Long term (current) use of insulin: Secondary | ICD-10-CM

## 2017-05-30 DIAGNOSIS — Z952 Presence of prosthetic heart valve: Secondary | ICD-10-CM

## 2017-05-30 DIAGNOSIS — Z79899 Other long term (current) drug therapy: Secondary | ICD-10-CM

## 2017-05-30 DIAGNOSIS — M109 Gout, unspecified: Secondary | ICD-10-CM | POA: Diagnosis present

## 2017-05-30 DIAGNOSIS — I5021 Acute systolic (congestive) heart failure: Secondary | ICD-10-CM | POA: Diagnosis not present

## 2017-05-30 DIAGNOSIS — J449 Chronic obstructive pulmonary disease, unspecified: Secondary | ICD-10-CM | POA: Diagnosis present

## 2017-05-30 DIAGNOSIS — I2510 Atherosclerotic heart disease of native coronary artery without angina pectoris: Secondary | ICD-10-CM | POA: Diagnosis present

## 2017-05-30 DIAGNOSIS — M10079 Idiopathic gout, unspecified ankle and foot: Secondary | ICD-10-CM

## 2017-05-30 DIAGNOSIS — I5022 Chronic systolic (congestive) heart failure: Secondary | ICD-10-CM | POA: Diagnosis present

## 2017-05-30 DIAGNOSIS — R791 Abnormal coagulation profile: Secondary | ICD-10-CM | POA: Diagnosis present

## 2017-05-30 DIAGNOSIS — I420 Dilated cardiomyopathy: Secondary | ICD-10-CM | POA: Diagnosis present

## 2017-05-30 DIAGNOSIS — I5023 Acute on chronic systolic (congestive) heart failure: Secondary | ICD-10-CM | POA: Diagnosis present

## 2017-05-30 DIAGNOSIS — E785 Hyperlipidemia, unspecified: Secondary | ICD-10-CM | POA: Diagnosis present

## 2017-05-30 DIAGNOSIS — D638 Anemia in other chronic diseases classified elsewhere: Secondary | ICD-10-CM | POA: Diagnosis present

## 2017-05-30 DIAGNOSIS — D72829 Elevated white blood cell count, unspecified: Secondary | ICD-10-CM | POA: Diagnosis present

## 2017-05-30 DIAGNOSIS — I509 Heart failure, unspecified: Secondary | ICD-10-CM

## 2017-05-30 DIAGNOSIS — I110 Hypertensive heart disease with heart failure: Secondary | ICD-10-CM | POA: Diagnosis not present

## 2017-05-30 DIAGNOSIS — I480 Paroxysmal atrial fibrillation: Secondary | ICD-10-CM | POA: Diagnosis present

## 2017-05-30 DIAGNOSIS — M10042 Idiopathic gout, left hand: Secondary | ICD-10-CM | POA: Diagnosis present

## 2017-05-30 DIAGNOSIS — I5030 Unspecified diastolic (congestive) heart failure: Secondary | ICD-10-CM

## 2017-05-30 DIAGNOSIS — G4733 Obstructive sleep apnea (adult) (pediatric): Secondary | ICD-10-CM | POA: Diagnosis present

## 2017-05-30 DIAGNOSIS — K219 Gastro-esophageal reflux disease without esophagitis: Secondary | ICD-10-CM | POA: Diagnosis present

## 2017-05-30 DIAGNOSIS — I10 Essential (primary) hypertension: Secondary | ICD-10-CM | POA: Diagnosis present

## 2017-05-30 DIAGNOSIS — Z87891 Personal history of nicotine dependence: Secondary | ICD-10-CM | POA: Diagnosis not present

## 2017-05-30 LAB — URINALYSIS, ROUTINE W REFLEX MICROSCOPIC
Bilirubin Urine: NEGATIVE
Glucose, UA: NEGATIVE mg/dL
KETONES UR: NEGATIVE mg/dL
Nitrite: NEGATIVE
PH: 5 (ref 5.0–8.0)
Protein, ur: 100 mg/dL — AB
SPECIFIC GRAVITY, URINE: 1.013 (ref 1.005–1.030)

## 2017-05-30 LAB — I-STAT CG4 LACTIC ACID, ED: Lactic Acid, Venous: 1.37 mmol/L (ref 0.5–1.9)

## 2017-05-30 LAB — I-STAT TROPONIN, ED: Troponin i, poc: 0.03 ng/mL (ref 0.00–0.08)

## 2017-05-30 LAB — COMPREHENSIVE METABOLIC PANEL
ALBUMIN: 3.2 g/dL — AB (ref 3.5–5.0)
ALK PHOS: 158 U/L — AB (ref 38–126)
ALT: 17 U/L (ref 17–63)
AST: 40 U/L (ref 15–41)
Anion gap: 8 (ref 5–15)
BILIRUBIN TOTAL: 3.6 mg/dL — AB (ref 0.3–1.2)
BUN: 42 mg/dL — ABNORMAL HIGH (ref 6–20)
CALCIUM: 9 mg/dL (ref 8.9–10.3)
CO2: 23 mmol/L (ref 22–32)
CREATININE: 2.37 mg/dL — AB (ref 0.61–1.24)
Chloride: 106 mmol/L (ref 101–111)
GFR calc non Af Amer: 29 mL/min — ABNORMAL LOW (ref >60)
GFR, EST AFRICAN AMERICAN: 33 mL/min — AB (ref >60)
GLUCOSE: 110 mg/dL — AB (ref 65–99)
Potassium: 4.1 mmol/L (ref 3.5–5.1)
SODIUM: 137 mmol/L (ref 135–145)
Total Protein: 8.1 g/dL (ref 6.5–8.1)

## 2017-05-30 LAB — CBC
HCT: 32.8 % — ABNORMAL LOW (ref 39.0–52.0)
Hemoglobin: 10.9 g/dL — ABNORMAL LOW (ref 13.0–17.0)
MCH: 25.1 pg — AB (ref 26.0–34.0)
MCHC: 33.2 g/dL (ref 30.0–36.0)
MCV: 75.6 fL — ABNORMAL LOW (ref 78.0–100.0)
PLATELETS: 182 10*3/uL (ref 150–400)
RBC: 4.34 MIL/uL (ref 4.22–5.81)
RDW: 18.8 % — AB (ref 11.5–15.5)
WBC: 6.7 10*3/uL (ref 4.0–10.5)

## 2017-05-30 LAB — PROTIME-INR
INR: 1.42
Prothrombin Time: 17.5 seconds — ABNORMAL HIGH (ref 11.4–15.2)

## 2017-05-30 LAB — GLUCOSE, CAPILLARY
GLUCOSE-CAPILLARY: 102 mg/dL — AB (ref 65–99)
Glucose-Capillary: 99 mg/dL (ref 65–99)

## 2017-05-30 LAB — BRAIN NATRIURETIC PEPTIDE: B Natriuretic Peptide: 1404.9 pg/mL — ABNORMAL HIGH (ref 0.0–100.0)

## 2017-05-30 LAB — LIPASE, BLOOD: Lipase: 87 U/L — ABNORMAL HIGH (ref 11–51)

## 2017-05-30 LAB — TSH: TSH: 2.924 u[IU]/mL (ref 0.350–4.500)

## 2017-05-30 MED ORDER — SODIUM CHLORIDE 0.9 % IV BOLUS (SEPSIS)
500.0000 mL | Freq: Once | INTRAVENOUS | Status: AC
  Administered 2017-05-30: 500 mL via INTRAVENOUS

## 2017-05-30 MED ORDER — WARFARIN - PHARMACIST DOSING INPATIENT
Freq: Every day | Status: DC
  Administered 2017-05-30 – 2017-06-01 (×2)

## 2017-05-30 MED ORDER — PANTOPRAZOLE SODIUM 40 MG PO TBEC
40.0000 mg | DELAYED_RELEASE_TABLET | Freq: Every day | ORAL | Status: DC
  Administered 2017-05-30 – 2017-06-02 (×4): 40 mg via ORAL
  Filled 2017-05-30 (×4): qty 1

## 2017-05-30 MED ORDER — SODIUM CHLORIDE 0.9 % IV SOLN: 250.0000 mL | INTRAVENOUS | Status: DC | PRN

## 2017-05-30 MED ORDER — INSULIN DETEMIR 100 UNIT/ML ~~LOC~~ SOLN
5.0000 [IU] | Freq: Every day | SUBCUTANEOUS | Status: DC
  Administered 2017-05-30 – 2017-06-01 (×3): 5 [IU] via SUBCUTANEOUS
  Filled 2017-05-30 (×4): qty 0.05

## 2017-05-30 MED ORDER — ONDANSETRON HCL 4 MG/2ML IJ SOLN: 4.0000 mg | Freq: Four times a day (QID) | INTRAMUSCULAR | Status: DC | PRN

## 2017-05-30 MED ORDER — FOLIC ACID 1 MG PO TABS
1.0000 mg | ORAL_TABLET | Freq: Every day | ORAL | Status: DC
  Administered 2017-05-30 – 2017-06-02 (×4): 1 mg via ORAL
  Filled 2017-05-30 (×4): qty 1

## 2017-05-30 MED ORDER — INSULIN ASPART 100 UNIT/ML ~~LOC~~ SOLN
0.0000 [IU] | Freq: Three times a day (TID) | SUBCUTANEOUS | Status: DC
Start: 2017-05-30 — End: 2017-06-02
  Administered 2017-05-31 – 2017-06-01 (×3): 1 [IU] via SUBCUTANEOUS

## 2017-05-30 MED ORDER — INSULIN DETEMIR 100 UNIT/ML FLEXPEN: 5.0000 [IU] | PEN_INJECTOR | Freq: Every day | SUBCUTANEOUS | Status: DC

## 2017-05-30 MED ORDER — ONDANSETRON HCL 4 MG/2ML IJ SOLN
4.0000 mg | Freq: Once | INTRAMUSCULAR | Status: AC
  Administered 2017-05-30: 4 mg via INTRAVENOUS
  Filled 2017-05-30: qty 2

## 2017-05-30 MED ORDER — IOPAMIDOL (ISOVUE-300) INJECTION 61%
INTRAVENOUS | Status: AC
  Filled 2017-05-30: qty 100

## 2017-05-30 MED ORDER — ALPRAZOLAM 0.25 MG PO TABS: 0.2500 mg | ORAL_TABLET | Freq: Two times a day (BID) | ORAL | Status: DC | PRN

## 2017-05-30 MED ORDER — DEXTROSE 5 % IV SOLN
2.0000 g | Freq: Every day | INTRAVENOUS | Status: DC
  Administered 2017-05-30 – 2017-06-01 (×3): 2 g via INTRAVENOUS
  Filled 2017-05-30 (×3): qty 2

## 2017-05-30 MED ORDER — FUROSEMIDE 10 MG/ML IJ SOLN
80.0000 mg | Freq: Once | INTRAMUSCULAR | Status: AC
  Administered 2017-05-30: 80 mg via INTRAVENOUS
  Filled 2017-05-30: qty 8

## 2017-05-30 MED ORDER — SODIUM CHLORIDE 0.9% FLUSH
3.0000 mL | Freq: Two times a day (BID) | INTRAVENOUS | Status: DC
  Administered 2017-05-30 – 2017-06-02 (×5): 3 mL via INTRAVENOUS

## 2017-05-30 MED ORDER — FENTANYL CITRATE (PF) 100 MCG/2ML IJ SOLN
50.0000 ug | Freq: Once | INTRAMUSCULAR | Status: AC
  Administered 2017-05-30: 50 ug via INTRAVENOUS
  Filled 2017-05-30: qty 2

## 2017-05-30 MED ORDER — WARFARIN SODIUM 5 MG PO TABS
5.0000 mg | ORAL_TABLET | Freq: Once | ORAL | Status: AC
  Administered 2017-05-30: 5 mg via ORAL
  Filled 2017-05-30: qty 1

## 2017-05-30 MED ORDER — ATORVASTATIN CALCIUM 20 MG PO TABS
20.0000 mg | ORAL_TABLET | Freq: Every day | ORAL | Status: DC
  Administered 2017-05-30 – 2017-06-02 (×4): 20 mg via ORAL
  Filled 2017-05-30 (×4): qty 1

## 2017-05-30 MED ORDER — FUROSEMIDE 20 MG PO TABS
40.0000 mg | ORAL_TABLET | Freq: Two times a day (BID) | ORAL | Status: DC
  Administered 2017-05-30: 40 mg via ORAL
  Filled 2017-05-30 (×2): qty 2

## 2017-05-30 MED ORDER — METOPROLOL TARTRATE 25 MG PO TABS
100.0000 mg | ORAL_TABLET | Freq: Two times a day (BID) | ORAL | Status: DC
  Administered 2017-05-30 – 2017-05-31 (×2): 100 mg via ORAL
  Filled 2017-05-30 (×3): qty 4

## 2017-05-30 NOTE — Progress Notes (Signed)
New pt admission from ED. Pt brought to the floor in stable condition. Vitals taken. Initial Assessment done. All immediate pertinent needs to patient addressed. Patient Guide given to patient. Important safety instructions relating to hospitalization reviewed with patient. Patient verbalized understanding. Will continue to monitor pt. 

## 2017-05-30 NOTE — H&P (Signed)
History and Physical    Michael [name] SHF:[id number] DOB: 09-02-1961 DOA: 05/30/2017   PCP: [name], MD   Patient coming from:  Home    Chief Complaint:  Nausea, Vomiting, abdominal pain   HPI: Michael [name] is a 56 y.o. male with medical history significant for HTN, HLD, CKD 4, COPD, CAD, PAF, pulmonary HTN, OSA , DM,  Chronic systolic CHF and liver cirrhosis presenting to the ED with increased bloating, nausea and vomiting, small amount of loose stools. Denies  radiation of the pain . Not aggravated by taking in deep breaths. No alleviating factors. Able to pass gas. Denies blood in the stools. Denies shortness of breath, cough or chest pain or palpitations . No recent trips or food poisoning. No changes in medication or over the counter products; he admits to medicine non compliance and food indiscretions. Denies a history of hepatitis. He reports bilateral lower extremity swelling. He is very fatigued. He does not smoke. No ETOH or recreational drug use. No prior abdominal surgeries. He has been admitted in the past around 02/2016 with similar symptoms    ED Course:  BP (!) 106/93   Pulse 95   Temp 97.6 F (36.4 C) (Oral)   Resp 17   SpO2 98%    lipase 87 glucose 110 creatinine 2.37  T bili 3.6 hemoglobin 10.9 urine with trace of leukocytes received fentanyl, but hundred cc bolus of IV fluids, and IV and IMF 6 with Zofran. CXT mild pulmonary edema and bb effusion  [id number]  EF 3/20 1740-45%, echo scheduled for 2 weeks from now  CT A/P Anasarca pattern with bilateral pleural effusions, ascites, and edema throughout the mesentery and subcutaneous soft tissues. L nephrolithiasis, no hydro  Review of Systems:  As per HPI otherwise all other systems reviewed and are negative  Past Medical History:  Diagnosis Date  . Anemia of chronic disease   . Asthma   . Chronic kidney disease (CKD), stage IV (severe) ([place])   . Chronic systolic CHF (congestive heart failure)  (HCC)    a. prior EF normal; b. Echo 10/16: Mild LVH, EF 30-35%, mitral valve bioprosthesis present without evidence of stenosis or regurgitation, severe LAE, mild RVE with mild to moderately reduced RVSF, moderate RAE  . Coronary artery disease   . Diabetes mellitus type 2 in obese ([place])   . Diabetes mellitus without complication ([place])   . GERD (gastroesophageal reflux disease)   . H/O tricuspid valve repair 2012  . History of echocardiogram    Echo at [facility] in [facility] 4/12: mod LVH, EF 60-65%, mod LAE, tissue MVR ok, mod TR, mod pulmo HTN with RVSP 48 mmHg  . Hypertension   . Obstructive sleep apnea   . Paroxysmal atrial fibrillation (HCC)    s/p Maze procedure at time of MVR in 2011  . Pulmonary HTN ([place])   . 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 CARDIAC SURGERY      Social History Social History   Social History  . Marital status: Single    Spouse name: N/A  . Number of children: N/A  . Years of education: N/A   Occupational History  . Not on file.   Social History Main Topics  . Smoking status: Former Research scientist (life sciences)  . Smokeless tobacco: Former Systems developer  . Alcohol use No  . Drug use: No  . Sexual activity: Not on file   Other Topics Concern  .  Not on file   Social History Narrative  . No narrative on file     No Known Allergies  Family History  Problem Relation Age of Onset  . Heart attack Neg Hx       Prior to Admission medications   Medication Sig Start Date End Date Taking? Authorizing Provider  acetaminophen (TYLENOL) 500 MG tablet Take 1 tablet (500 mg total) by mouth every 6 (six) hours as needed. 10/07/16   Rosita Fire, MD  atorvastatin (LIPITOR) 20 MG tablet Take 1 tablet (20 mg total) by mouth daily. 03/03/17   [name], MD  COLCRYS 0.6 MG tablet TAKE 2 TABLETS AT THE ONSET OF A GOUT ATTACK, MAY REPEAT 1 TABLET IF SYMPTOMS PERSIST. 04/20/17   [name], MD  folic acid  (FOLVITE) 1 MG tablet TAKE 1 TABLET BY MOUTH DAILY. 04/20/17   [name], MD  furosemide (LASIX) 80 MG tablet Take 1 tablet (80 mg total) by mouth 2 (two) times daily. 12/12/16   [name], MD  glucose blood (ACCU-CHEK AVIVA) test strip Use 3 times daily as directed. 10/16/16   [name], MD  Insulin Detemir (LEVEMIR FLEXTOUCH) 100 UNIT/ML Pen Inject 10 Units into the skin daily at 10 pm. 10/16/16   [name], MD  Insulin Pen Needle 31G X 5 MM MISC 1 each by Does not apply route at bedtime. 06/17/16   [name], MD  Lancet Devices [facility]) lancets Use as instructed 3 times daily 01/24/16   [name], MD  metoprolol (LOPRESSOR) 100 MG tablet Take 1 tablet (100 mg total) by mouth 2 (two) times daily. 10/16/16   [name], MD  omeprazole (PRILOSEC) 20 MG capsule Take 1 capsule (20 mg total) by mouth daily. 03/03/17   [name], MD  traMADol (ULTRAM) 50 MG tablet Take 1 tablet (50 mg total) by mouth every 12 (twelve) hours as needed. 12/12/16   [name], MD  warfarin (COUMADIN) 2.5 MG tablet Take coumadin as directed by coumadin clinic 01/28/17   Fay Records, MD    Physical Exam:  Vitals:   05/30/17 1415 05/30/17 1430 05/30/17 1445 05/30/17 1500  BP: 123/90 109/85 118/83 (!) 106/93  Pulse:      Resp: 10 11 17 17   Temp:      TempSrc:      SpO2:       Constitutional: ill appearing, in significant amount of pain  Eyes: PERRL, lids and conjunctivae normal ENMT: Mucous membranes are somewhat dry , without exudate or lesions  Neck: normal, supple, no masses, no thyromegaly Respiratory: clear to auscultation bilaterally, no wheezing, no crackles. Normal respiratory effort  Cardiovascular: Irregularly irregular  rate and rhythm, 2.6  murmurs, rubs or gallops. 1+ bilateral lower extremity edema. 2+ pedal pulses. No carotid bruits.  Abdomen: Soft, diffuse tenderness, worse at the RUQ , No hepatosplenomegaly. Bowel sounds positive.  Musculoskeletal: no clubbing  / cyanosis. Moves all extremities Skin: no jaundice, No lesions.  Neurologic: Sensation intact  Strength equal in all extremities Psychiatric:   Alert and oriented x 3.Anxious      Labs on Admission: I have personally reviewed following labs and imaging studies  CBC:  Recent Labs Lab 05/30/17 0545  WBC 6.7  HGB 10.9*  HCT 32.8*  MCV 75.6*  PLT 696    Basic Metabolic Panel:  Recent Labs Lab 05/30/17 0545  NA 137  K 4.1  CL 106  CO2 23  GLUCOSE 110*  BUN 42*  CREATININE  2.37*  CALCIUM 9.0    GFR: CrCl cannot be calculated (Unknown ideal weight.).  Liver Function Tests:  Recent Labs Lab 05/30/17 0545  AST 40  ALT 17  ALKPHOS 158*  BILITOT 3.6*  PROT 8.1  ALBUMIN 3.2*    Recent Labs Lab 05/30/17 0545  LIPASE 87*   No results for input(s): AMMONIA in the last 168 hours.  Coagulation Profile:  Recent Labs Lab 05/30/17 0900  INR 1.42    Cardiac Enzymes: No results for input(s): CKTOTAL, CKMB, CKMBINDEX, TROPONINI in the last 168 hours.  BNP (last 3 results)  Recent Labs  03/16/17 1401  PROBNP 6,530*    HbA1C: No results for input(s): HGBA1C in the last 72 hours.  CBG: No results for input(s): GLUCAP in the last 168 hours.  Lipid Profile: No results for input(s): CHOL, HDL, LDLCALC, TRIG, CHOLHDL, LDLDIRECT in the last 72 hours.  Thyroid Function Tests: No results for input(s): TSH, T4TOTAL, FREET4, T3FREE, THYROIDAB in the last 72 hours.  Anemia Panel: No results for input(s): VITAMINB12, FOLATE, FERRITIN, TIBC, IRON, RETICCTPCT in the last 72 hours.  Urine analysis:    Component Value Date/Time   COLORURINE YELLOW 05/30/2017 0538   APPEARANCEUR CLEAR 05/30/2017 0538   LABSPEC 1.013 05/30/2017 0538   PHURINE 5.0 05/30/2017 0538   GLUCOSEU NEGATIVE 05/30/2017 0538   HGBUR SMALL (A) 05/30/2017 0538   BILIRUBINUR NEGATIVE 05/30/2017 0538   KETONESUR NEGATIVE 05/30/2017 0538   PROTEINUR 100 (A) 05/30/2017 0538   NITRITE  NEGATIVE 05/30/2017 0538   LEUKOCYTESUR TRACE (A) 05/30/2017 0538    Sepsis Labs: @LABRCNTIP (procalcitonin:4,lacticidven:4) )No results found for this or any previous visit (from the past 240 hour(s)).   Radiological Exams on Admission: Ct Abdomen Pelvis Wo Contrast  Result Date: 05/30/2017 CLINICAL DATA:  Nausea, vomiting, diarrhea, abdominal pain EXAM: CT ABDOMEN AND PELVIS WITHOUT CONTRAST TECHNIQUE: Multidetector CT imaging of the abdomen and pelvis was performed following the standard protocol without IV contrast. COMPARISON:  02/25/2016 FINDINGS: Lower chest: Cardiomegaly. Small to moderate bilateral pleural effusions, right greater than left. Bibasilar atelectasis. Hepatobiliary: No focal hepatic abnormality. Gallbladder unremarkable. Pancreas: No focal abnormality or ductal dilatation. Spleen: No focal abnormality.  Normal size. Adrenals/Urinary Tract: Large early staghorn calculus in the left kidney, the largest area measuring up to 2 cm in the left renal pelvis. No hydronephrosis. No ureteral stones. Adrenal glands and urinary bladder unremarkable. Stomach/Bowel: Stomach, large and small bowel grossly unremarkable. Vascular/Lymphatic: Aortic and iliac calcifications. No aneurysm or adenopathy. Reproductive: No visible focal abnormality. Other: Moderate perihepatic and perisplenic free fluid. Small amount of free fluid in the cul-de-sac. There is diffuse edema throughout the mesentery and subcutaneous soft tissues. Musculoskeletal: No acute bony abnormality. IMPRESSION: Anasarca pattern with bilateral pleural effusions, ascites, and edema throughout the mesentery and subcutaneous soft tissues. Cardiomegaly. Left renal stones.  No hydronephrosis. No evidence of bowel obstruction. Electronically Signed   By: Rolm Baptise M.D.   On: 05/30/2017 11:01   Dg Chest 2 View  Result Date: 05/30/2017 CLINICAL DATA:  Nausea, vomiting, diarrhea, fatigue and unable to sleep last night, feels swollen, fever  subjectively, history hypertension, CHF, asthma, diabetes mellitus, former smoker EXAM: CHEST  2 VIEW COMPARISON:  10/05/2016 FINDINGS: Enlargement of cardiac silhouette post MVR with mild pulmonary vascular congestion. Mediastinal contours normal. Atherosclerotic calcification aorta. Mild perihilar to basilar infiltrates, slightly greater on RIGHT than LEFT, favor asymmetric pulmonary edema. Suspect small pleural effusions bilaterally. No pneumothorax. Bones demineralized. IMPRESSION: Enlargement of cardiac silhouette with pulmonary  vascular congestion. Probable mild pulmonary edema and bibasilar effusions. Aortic Atherosclerosis (ICD10-I70.0). Electronically Signed   By: Lavonia Dana M.D.   On: 05/30/2017 11:42    EKG: Independently reviewed.  Assessment/Plan Active Problems:   Volume overload   CKD (chronic kidney disease), stage IV (HCC)   DM type 2 (diabetes mellitus, type 2) (HCC)   Essential hypertension   Chronic systolic CHF (congestive heart failure) (HCC)   Gout   Chronic atrial fibrillation (HCC)   Dilated cardiomyopathy (HCC)   GERD (gastroesophageal reflux disease)   Liver cirrhosis (HCC)   HLD (hyperlipidemia)   Anasarca   Acute on chronic systolic CHF exacerbation.  CXR pulmonary vascular congestion  BNP 1440  Last 2 D echo EF 3/20 1740-45%,  Lactic Acid normal    WBC 6.7   Osats normal   VSS  [id number] lbs   Received IV Lasix 80 mg  in ED with excellent diuresis . Will refrain of more diuresis unless fluid reacummulation due to severe CKD  -  Telemetry CHF order set Cycle troponins  Daily weights and strict I/O   ECHO    Lasix mg IV BID  Anasarca in a patient with liver cirrhosis and systolic CHF, CKD4. As mentioned above received IV 80 Lasix, diuresing well. Bili elevated at 3.6  Repeat CMET Needs to follow with GI upon discharge   Chronic kidney disease stage 4  baseline creatinine 2   Current Cr 2.37 Lab Results  Component Value Date   CREATININE 2.37 (H)  05/30/2017   CREATININE 1.74 (H) 03/30/2017   CREATININE 2.57 (H) 03/16/2017   Monitor Cr changes due to initiation of Lasix for the treatment of fluid overload  Hold diuretics  Repeat CMET in am   Hypertension BP   106/93   Pulse 95    Controlled Continue home anti-hypertensive medications  Add Hydralazine Q6 hours as needed for BP 160/90   Hyperlipidemia Continue home statins  Chronic Atrial Fibrillation CHADSVASC 3   on anticoagulation with Coumadin   Rate controlled Continue meds, Coumadin  Telemetry   Anemia of chronic disease Hemoglobin on admission 10.9 at baseline  Repeat CBC in am  No transfusion is indicated at this time       GERD, no acute symptoms Continue PPI   DVT prophylaxis:Coumadin  Code Status:   Full    Family Communication:  Discussed with patient Disposition Plan: Expect patient to be discharged to home after condition improves Consults called:    None  Admission status:Tele  Obs    Darin Redmann E, PA-C [facility]   05/30/2017, 3:03 PM

## 2017-05-30 NOTE — ED Provider Notes (Signed)
[facility] DEPT Provider Note   CSN: [id number] Arrival date & time: 05/30/17  0533     History   Chief Complaint Chief Complaint  Patient presents with  . Emesis    HPI Michael [name] is a 56 y.o. male.  Patient is a 56 year old male with a history of diabetes, hypertension, chronic A. fib on Coumadin and prior history of mitral and tricuspid valvular repair with a porcine valve. He presents with abdominal pain. He states it started yesterday. He feels like his abdomen is bloated. He's had associated nausea and vomiting. He's had a small amount of loose stools. No blood in his stools. No bloody emesis. No known fevers but he does feel cold. He is not taking any medications for the symptoms. He denies any prior abdominal surgeries.      Past Medical History:  Diagnosis Date  . Anemia of chronic disease   . Asthma   . Chronic kidney disease (CKD), stage IV (severe) ([place])   . Chronic systolic CHF (congestive heart failure) (HCC)    a. prior EF normal; b. Echo 10/16: Mild LVH, EF 30-35%, mitral valve bioprosthesis present without evidence of stenosis or regurgitation, severe LAE, mild RVE with mild to moderately reduced RVSF, moderate RAE  . Coronary artery disease   . Diabetes mellitus type 2 in obese ([place])   . Diabetes mellitus without complication ([place])   . GERD (gastroesophageal reflux disease)   . H/O tricuspid valve repair 2012  . History of echocardiogram    Echo at [facility] in [facility] 4/12: mod LVH, EF 60-65%, mod LAE, tissue MVR ok, mod TR, mod pulmo HTN with RVSP 48 mmHg  . Hypertension   . Obstructive sleep apnea   . Paroxysmal atrial fibrillation (HCC)    s/p Maze procedure at time of MVR in 2011  . Pulmonary HTN ([place])   . S/P mitral valve replacement with porcine valve 2012    Patient Active Problem List   Diagnosis Date Noted  . HLD (hyperlipidemia) 10/05/2016  . Mouth bleeding 10/05/2016  . Bilateral knee pain 10/05/2016  . Petechial rash  10/05/2016  . Laceration of tongue   . Insomnia 06/10/2016  . Supratherapeutic INR 06/01/2016  . Acute hepatic encephalopathy 03/15/2016  . Liver cirrhosis ([place]) 03/15/2016  . Volume overload 03/15/2016  . Acute congestive heart failure ([place])   . Insulin dependent diabetes mellitus ([place])   . Acute renal failure (ARF) ([place]) 03/14/2016  . GERD (gastroesophageal reflux disease) 03/04/2016  . Aortic regurgitation 02/27/2016  . Viral gastroenteritis 02/27/2016  . A-fib ([place]) 02/26/2016  . Abdominal pain 02/25/2016  . Diabetes mellitus with complication ([place]) 77/93/9030  . Chronic atrial fibrillation with RVR ([place]) 02/25/2016  . Acute on chronic systolic CHF (congestive heart failure) ([place]) 02/25/2016  . Anemia, chronic disease 02/25/2016  . Atrial fibrillation with RVR ([place]) 02/19/2016  . Pain in joint, ankle and foot 01/10/2016  . Asthma   . Acute hypoxemic respiratory failure ([place]) 12/29/2015  . Acute respiratory failure ([place]) 12/29/2015  . Chronic atrial fibrillation ([place]) 12/24/2015  . Dilated cardiomyopathy ([place]) 12/24/2015  . Polypharmacy 11/23/2015  . Gout 10/23/2015  . Encounter for therapeutic drug monitoring 10/19/2015  . Anemia - mild exacerbation 10/15/2015  . Chronic systolic CHF (congestive heart failure) ([place]) 10/09/2015  . Tricuspid valve disease 10/09/2015  . Mitral valve disease   . CKD (chronic kidney disease), stage IV ([place]) 09/10/2015  . Calf pain 09/10/2015  . DM type 2 (diabetes mellitus, type 2) ([place]) 09/10/2015  .  Essential hypertension 09/10/2015    Past Surgical History:  Procedure Laterality Date  . CARDIAC SURGERY         Home Medications    Prior to Admission medications   Medication Sig Start Date End Date Taking? Authorizing Provider  acetaminophen (TYLENOL) 500 MG tablet Take 1 tablet (500 mg total) by mouth every 6 (six) hours as needed. 10/07/16   Rosita Fire, MD  atorvastatin (LIPITOR) 20 MG tablet Take 1 tablet (20 mg  total) by mouth daily. 03/03/17   [name], MD  COLCRYS 0.6 MG tablet TAKE 2 TABLETS AT THE ONSET OF A GOUT ATTACK, MAY REPEAT 1 TABLET IF SYMPTOMS PERSIST. 04/20/17   [name], MD  folic acid (FOLVITE) 1 MG tablet TAKE 1 TABLET BY MOUTH DAILY. 04/20/17   [name], MD  furosemide (LASIX) 80 MG tablet Take 1 tablet (80 mg total) by mouth 2 (two) times daily. 12/12/16   [name], MD  glucose blood (ACCU-CHEK AVIVA) test strip Use 3 times daily as directed. 10/16/16   [name], MD  Insulin Detemir (LEVEMIR FLEXTOUCH) 100 UNIT/ML Pen Inject 10 Units into the skin daily at 10 pm. 10/16/16   [name], MD  Insulin Pen Needle 31G X 5 MM MISC 1 each by Does not apply route at bedtime. 06/17/16   [name], MD  Lancet Devices [facility]) lancets Use as instructed 3 times daily 01/24/16   [name], MD  metoprolol (LOPRESSOR) 100 MG tablet Take 1 tablet (100 mg total) by mouth 2 (two) times daily. 10/16/16   [name], MD  omeprazole (PRILOSEC) 20 MG capsule Take 1 capsule (20 mg total) by mouth daily. 03/03/17   [name], MD  traMADol (ULTRAM) 50 MG tablet Take 1 tablet (50 mg total) by mouth every 12 (twelve) hours as needed. 12/12/16   [name], MD  warfarin (COUMADIN) 2.5 MG tablet Take coumadin as directed by coumadin clinic 01/28/17   Fay Records, MD    Family History Family History  Problem Relation Age of Onset  . Heart attack Neg Hx     Social History Social History  Substance Use Topics  . Smoking status: Former Research scientist (life sciences)  . Smokeless tobacco: Former Systems developer  . Alcohol use No     Allergies   Patient has no known allergies.   Review of Systems Review of Systems  Constitutional: Positive for fatigue. Negative for chills, diaphoresis and fever.  HENT: Negative for congestion, rhinorrhea and sneezing.   Eyes: Negative.   Respiratory: Negative for cough, chest tightness and shortness of breath.   Cardiovascular: Negative for chest pain and  leg swelling.  Gastrointestinal: Positive for abdominal pain, diarrhea, nausea and vomiting. Negative for blood in stool.  Genitourinary: Negative for difficulty urinating, flank pain, frequency and hematuria.  Musculoskeletal: Negative for arthralgias and back pain.  Skin: Negative for rash.  Neurological: Negative for dizziness, speech difficulty, weakness, numbness and headaches.     Physical Exam Updated Vital Signs BP 108/85   Pulse 95   Temp 97.6 F (36.4 C) (Oral)   Resp 13   SpO2 98%   Physical Exam  Constitutional: He is oriented to person, place, and time. He appears well-developed and well-nourished.  HENT:  Head: Normocephalic and atraumatic.  Eyes: Pupils are equal, round, and reactive to light.  Neck: Normal range of motion. Neck supple.  Cardiovascular: Normal rate and regular rhythm.   Murmur heard. Pulmonary/Chest: Effort normal and breath sounds normal. No respiratory  distress. He has no wheezes. He has no rales. He exhibits no tenderness.  Abdominal: Soft. Bowel sounds are normal. There is tenderness (Moderate diffuse tenderness). There is no rebound and no guarding.  Musculoskeletal: Normal range of motion. He exhibits no edema.  Lymphadenopathy:    He has no cervical adenopathy.  Neurological: He is alert and oriented to person, place, and time.  Skin: Skin is warm and dry. No rash noted.  Psychiatric: He has a normal mood and affect.     ED Treatments / Results  Labs (all labs ordered are listed, but only abnormal results are displayed) Labs Reviewed  LIPASE, BLOOD - Abnormal; Notable for the following:       Result Value   Lipase 87 (*)    All other components within normal limits  COMPREHENSIVE METABOLIC PANEL - Abnormal; Notable for the following:    Glucose, Bld 110 (*)    BUN 42 (*)    Creatinine, Ser 2.37 (*)    Albumin 3.2 (*)    Alkaline Phosphatase 158 (*)    Total Bilirubin 3.6 (*)    GFR calc non Af Amer 29 (*)    GFR calc Af Amer  33 (*)    All other components within normal limits  CBC - Abnormal; Notable for the following:    Hemoglobin 10.9 (*)    HCT 32.8 (*)    MCV 75.6 (*)    MCH 25.1 (*)    RDW 18.8 (*)    All other components within normal limits  URINALYSIS, ROUTINE W REFLEX MICROSCOPIC - Abnormal; Notable for the following:    Hgb urine dipstick SMALL (*)    Protein, ur 100 (*)    Leukocytes, UA TRACE (*)    Bacteria, UA RARE (*)    Squamous Epithelial / LPF 0-5 (*)    All other components within normal limits  PROTIME-INR - Abnormal; Notable for the following:    Prothrombin Time 17.5 (*)    All other components within normal limits  BRAIN NATRIURETIC PEPTIDE - Abnormal; Notable for the following:    B Natriuretic Peptide 1,404.9 (*)    All other components within normal limits  I-STAT CG4 LACTIC ACID, ED  I-STAT TROPOININ, ED    EKG  EKG Interpretation  Date/Time:  Saturday May 30 2017 08:55:38 EDT Ventricular Rate:  89 PR Interval:    QRS Duration: 122 QT Interval:  432 QTC Calculation: 526 R Axis:   -11 Text Interpretation:  Atrial fibrillation Right bundle branch block Nonspecific T abnormalities, lateral leads Baseline wander in lead(s) I V2 since last tracing no significant change Confirmed by Malvin Johns [phone]) on 05/30/2017 9:22:04 AM       Radiology Ct Abdomen Pelvis Wo Contrast  Result Date: 05/30/2017 CLINICAL DATA:  Nausea, vomiting, diarrhea, abdominal pain EXAM: CT ABDOMEN AND PELVIS WITHOUT CONTRAST TECHNIQUE: Multidetector CT imaging of the abdomen and pelvis was performed following the standard protocol without IV contrast. COMPARISON:  02/25/2016 FINDINGS: Lower chest: Cardiomegaly. Small to moderate bilateral pleural effusions, right greater than left. Bibasilar atelectasis. Hepatobiliary: No focal hepatic abnormality. Gallbladder unremarkable. Pancreas: No focal abnormality or ductal dilatation. Spleen: No focal abnormality.  Normal size. Adrenals/Urinary Tract:  Large early staghorn calculus in the left kidney, the largest area measuring up to 2 cm in the left renal pelvis. No hydronephrosis. No ureteral stones. Adrenal glands and urinary bladder unremarkable. Stomach/Bowel: Stomach, large and small bowel grossly unremarkable. Vascular/Lymphatic: Aortic and iliac calcifications. No aneurysm or adenopathy.  Reproductive: No visible focal abnormality. Other: Moderate perihepatic and perisplenic free fluid. Small amount of free fluid in the cul-de-sac. There is diffuse edema throughout the mesentery and subcutaneous soft tissues. Musculoskeletal: No acute bony abnormality. IMPRESSION: Anasarca pattern with bilateral pleural effusions, ascites, and edema throughout the mesentery and subcutaneous soft tissues. Cardiomegaly. Left renal stones.  No hydronephrosis. No evidence of bowel obstruction. Electronically Signed   By: Rolm Baptise M.D.   On: 05/30/2017 11:01   Dg Chest 2 View  Result Date: 05/30/2017 CLINICAL DATA:  Nausea, vomiting, diarrhea, fatigue and unable to sleep last night, feels swollen, fever subjectively, history hypertension, CHF, asthma, diabetes mellitus, former smoker EXAM: CHEST  2 VIEW COMPARISON:  10/05/2016 FINDINGS: Enlargement of cardiac silhouette post MVR with mild pulmonary vascular congestion. Mediastinal contours normal. Atherosclerotic calcification aorta. Mild perihilar to basilar infiltrates, slightly greater on RIGHT than LEFT, favor asymmetric pulmonary edema. Suspect small pleural effusions bilaterally. No pneumothorax. Bones demineralized. IMPRESSION: Enlargement of cardiac silhouette with pulmonary vascular congestion. Probable mild pulmonary edema and bibasilar effusions. Aortic Atherosclerosis (ICD10-I70.0). Electronically Signed   By: Lavonia Dana M.D.   On: 05/30/2017 11:42    Procedures Procedures (including critical care time)  Medications Ordered in ED Medications  sodium chloride 0.9 % bolus 500 mL (0 mLs Intravenous  Stopped 05/30/17 1001)  fentaNYL (SUBLIMAZE) injection 50 mcg (50 mcg Intravenous Given 05/30/17 0901)  ondansetron (ZOFRAN) injection 4 mg (4 mg Intravenous Given 05/30/17 0901)  furosemide (LASIX) injection 80 mg (80 mg Intravenous Given 05/30/17 1157)     Initial Impression / Assessment and Plan / ED Course  I have reviewed the triage vital signs and the nursing notes.  Pertinent labs & imaging results that were available during my care of the patient were reviewed by me and considered in my medical decision making (see chart for details).     Patient is a 56 year old male who presents with abdominal distention associated with nausea and vomiting. CT scan shows evidence of anasarca. No other acute process. There some signs of vascular congestion on chest x-ray with an elevated BNP. He's maintaining normal oxygen saturations. He denies any chest pain or shortness of breath. He was given Lasix in the ED and has had some diuresis with this. There is no signs of a urinary infection. No ischemic changes on EKG. His BNP is markedly elevated. His creatinine is also elevated compared to his baseline values. I spoke with [name] with the hospitalist service to admit the patient.  Final Clinical Impressions(s) / ED Diagnoses   Final diagnoses:  Anasarca  Acute systolic congestive heart failure ([place])    New Prescriptions New Prescriptions   No medications on file     Malvin Johns, MD 05/30/17 1440

## 2017-05-30 NOTE — ED Notes (Signed)
Pt wheeled to room and assisted into bed. Pt placed onto monitor.

## 2017-05-30 NOTE — Progress Notes (Signed)
ANTICOAGULATION CONSULT NOTE - Initial Consult  Pharmacy Consult for Coumadin  Indication: atrial fibrillation  No Known Allergies  Patient Measurements:   Heparin Dosing Weight:   Vital Signs: Temp: 97.6 F (36.4 C) (07/14 0540) Temp Source: Oral (07/14 0540) BP: 103/78 (07/14 1545) Pulse Rate: 63 (07/14 1545)  Labs:  Recent Labs  05/30/17 0545 05/30/17 0900  HGB 10.9*  --   HCT 32.8*  --   PLT 182  --   LABPROT  --  17.5*  INR  --  1.42  CREATININE 2.37*  --     CrCl cannot be calculated (Unknown ideal weight.).   Medical History: Past Medical History:  Diagnosis Date  . Anemia of chronic disease   . Asthma   . Chronic kidney disease (CKD), stage IV (severe) ([place])   . Chronic systolic CHF (congestive heart failure) (HCC)    a. prior EF normal; b. Echo 10/16: Mild LVH, EF 30-35%, mitral valve bioprosthesis present without evidence of stenosis or regurgitation, severe LAE, mild RVE with mild to moderately reduced RVSF, moderate RAE  . Coronary artery disease   . Diabetes mellitus type 2 in obese ([place])   . Diabetes mellitus without complication ([place])   . GERD (gastroesophageal reflux disease)   . H/O tricuspid valve repair 2012  . History of echocardiogram    Echo at [facility] in [facility] 4/12: mod LVH, EF 60-65%, mod LAE, tissue MVR ok, mod TR, mod pulmo HTN with RVSP 48 mmHg  . Hypertension   . Obstructive sleep apnea   . Paroxysmal atrial fibrillation (HCC)    s/p Maze procedure at time of MVR in 2011  . Pulmonary HTN ([place])   . S/P mitral valve replacement with porcine valve 2012    Medications:  Prescriptions Prior to Admission  Medication Sig Dispense Refill Last Dose  . acetaminophen (TYLENOL) 500 MG tablet Take 1 tablet (500 mg total) by mouth every 6 (six) hours as needed. 30 tablet 0 PRN  . atorvastatin (LIPITOR) 20 MG tablet Take 1 tablet (20 mg total) by mouth daily. 30 tablet 5 05/29/2017 at Unknown time  . COLCRYS 0.6 MG tablet TAKE 2 TABLETS AT  THE ONSET OF A GOUT ATTACK, MAY REPEAT 1 TABLET IF SYMPTOMS PERSIST. 30 tablet 2 PRN  . folic acid (FOLVITE) 1 MG tablet TAKE 1 TABLET BY MOUTH DAILY. 30 tablet 2 05/28/2017  . furosemide (LASIX) 80 MG tablet Take 1 tablet (80 mg total) by mouth 2 (two) times daily. 60 tablet 3 05/28/2017  . Insulin Detemir (LEVEMIR FLEXTOUCH) 100 UNIT/ML Pen Inject 10 Units into the skin daily at 10 pm. 15 mL 11 05/28/2017  . metoprolol (LOPRESSOR) 100 MG tablet Take 1 tablet (100 mg total) by mouth 2 (two) times daily. 60 tablet 5 05/28/2017 at pm  . omeprazole (PRILOSEC) 20 MG capsule Take 1 capsule (20 mg total) by mouth daily. 30 capsule 5 05/28/2017  . traMADol (ULTRAM) 50 MG tablet Take 1 tablet (50 mg total) by mouth every 12 (twelve) hours as needed. 60 tablet 2 05/28/2017  . warfarin (COUMADIN) 2.5 MG tablet Take coumadin as directed by coumadin clinic (Patient taking differently: Take 2.5 mg by mouth daily. ) 45 tablet 1 05/28/2017 at pm    Assessment: 56 y.o male on anticoagulation with coumadin PTA for Chronic Atrial Fibrillation CHADSVASC 3 . MD notes rate controlled. Pharmacy consulted to continue coumadin.  INR on admit 05/30/17 = 1.42, subtherapeutic Hgb 10.9, pltc 182K   PTA coumadin dose:  2.5mg  daily, last taken 7/12 PM Last [facility] visit note 04/06/17:  INR was 1.6 , pt instructed to take 2.5mg  x2 tabs on 5/21 then continue 5mg  daily except 2.5mg  qSTTh, was to recheck INR in 2 weeks  (04/20/17 but no result noted)   Goal of Therapy:  INR 2-3 Monitor platelets by anticoagulation protocol: Yes   Plan:   Thank you for allowing pharmacy to be part of this patients care team. Nicole Cella, RPh Clinical Pharmacist Pager: [phone] [place] [phone] 05/30/2017,4:14 PM

## 2017-05-30 NOTE — ED Triage Notes (Signed)
Pt states that he started having n/v/d last night and is fatigued and unable to sleep. Denies abd pain but states it feels swollen. Pt states he feels that he has a fever because he is cold, afebrile in triage.

## 2017-05-30 NOTE — ED Notes (Signed)
Patient transported to CT 

## 2017-05-30 NOTE — Progress Notes (Signed)
Pharmacy Antibiotic Note  Michael [name] is a 56 y.o. male admitted on 05/30/2017 with possible sbp to have paracentesis tomorrow.  Pharmacy has been consulted for ceftriaxone dosing. Antibiotic dose not need renal dose adjusting, please re-consult if you need further assistance.  Plan: Ceftriaxone 2g IV QD F/U clinical progression, LOT, C/S  Height: 5\' 5"  (165.1 cm) Weight: 179 lb (81.2 kg) IBW/kg (Calculated) : 61.5  Temp (24hrs), Avg:97.6 F (36.4 C), Min:97.5 F (36.4 C), Max:97.6 F (36.4 C)   Recent Labs Lab 05/30/17 0545 05/30/17 0914  WBC 6.7  --   CREATININE 2.37*  --   LATICACIDVEN  --  1.37    Estimated Creatinine Clearance: 33.8 mL/min (A) (by C-G formula based on SCr of 2.37 mg/dL (H)).    No Known Allergies  Antimicrobials this admission: Ceftriaxone 7/14>>  Dose adjustments this admission: N/A  Microbiology results: none  Thank you for allowing pharmacy to be a part of this patient's care.  Jodean Lima Gerlene Glassburn 05/30/2017 6:10 PM

## 2017-05-31 ENCOUNTER — Observation Stay (HOSPITAL_COMMUNITY): Payer: Medicare Other

## 2017-05-31 ENCOUNTER — Other Ambulatory Visit (HOSPITAL_COMMUNITY): Payer: Medicare Other

## 2017-05-31 DIAGNOSIS — G4733 Obstructive sleep apnea (adult) (pediatric): Secondary | ICD-10-CM | POA: Diagnosis present

## 2017-05-31 DIAGNOSIS — M10042 Idiopathic gout, left hand: Secondary | ICD-10-CM | POA: Diagnosis present

## 2017-05-31 DIAGNOSIS — I480 Paroxysmal atrial fibrillation: Secondary | ICD-10-CM | POA: Diagnosis present

## 2017-05-31 DIAGNOSIS — E8809 Other disorders of plasma-protein metabolism, not elsewhere classified: Secondary | ICD-10-CM | POA: Diagnosis present

## 2017-05-31 DIAGNOSIS — I10 Essential (primary) hypertension: Secondary | ICD-10-CM

## 2017-05-31 DIAGNOSIS — E118 Type 2 diabetes mellitus with unspecified complications: Secondary | ICD-10-CM | POA: Diagnosis not present

## 2017-05-31 DIAGNOSIS — I5023 Acute on chronic systolic (congestive) heart failure: Secondary | ICD-10-CM | POA: Diagnosis present

## 2017-05-31 DIAGNOSIS — R609 Edema, unspecified: Secondary | ICD-10-CM | POA: Diagnosis not present

## 2017-05-31 DIAGNOSIS — E669 Obesity, unspecified: Secondary | ICD-10-CM | POA: Diagnosis present

## 2017-05-31 DIAGNOSIS — I5022 Chronic systolic (congestive) heart failure: Secondary | ICD-10-CM | POA: Diagnosis not present

## 2017-05-31 DIAGNOSIS — K219 Gastro-esophageal reflux disease without esophagitis: Secondary | ICD-10-CM | POA: Diagnosis present

## 2017-05-31 DIAGNOSIS — J449 Chronic obstructive pulmonary disease, unspecified: Secondary | ICD-10-CM | POA: Diagnosis present

## 2017-05-31 DIAGNOSIS — E7800 Pure hypercholesterolemia, unspecified: Secondary | ICD-10-CM

## 2017-05-31 DIAGNOSIS — D638 Anemia in other chronic diseases classified elsewhere: Secondary | ICD-10-CM | POA: Diagnosis present

## 2017-05-31 DIAGNOSIS — I129 Hypertensive chronic kidney disease with stage 1 through stage 4 chronic kidney disease, or unspecified chronic kidney disease: Secondary | ICD-10-CM | POA: Diagnosis not present

## 2017-05-31 DIAGNOSIS — I482 Chronic atrial fibrillation: Secondary | ICD-10-CM

## 2017-05-31 DIAGNOSIS — I4891 Unspecified atrial fibrillation: Secondary | ICD-10-CM

## 2017-05-31 DIAGNOSIS — K7460 Unspecified cirrhosis of liver: Secondary | ICD-10-CM | POA: Diagnosis not present

## 2017-05-31 DIAGNOSIS — R188 Other ascites: Secondary | ICD-10-CM | POA: Diagnosis present

## 2017-05-31 DIAGNOSIS — E785 Hyperlipidemia, unspecified: Secondary | ICD-10-CM | POA: Diagnosis present

## 2017-05-31 DIAGNOSIS — I5021 Acute systolic (congestive) heart failure: Secondary | ICD-10-CM | POA: Diagnosis not present

## 2017-05-31 DIAGNOSIS — Z952 Presence of prosthetic heart valve: Secondary | ICD-10-CM | POA: Diagnosis not present

## 2017-05-31 DIAGNOSIS — I272 Pulmonary hypertension, unspecified: Secondary | ICD-10-CM | POA: Diagnosis present

## 2017-05-31 DIAGNOSIS — R601 Generalized edema: Secondary | ICD-10-CM | POA: Diagnosis not present

## 2017-05-31 DIAGNOSIS — M79642 Pain in left hand: Secondary | ICD-10-CM | POA: Diagnosis not present

## 2017-05-31 DIAGNOSIS — I509 Heart failure, unspecified: Secondary | ICD-10-CM

## 2017-05-31 DIAGNOSIS — Z6830 Body mass index (BMI) 30.0-30.9, adult: Secondary | ICD-10-CM | POA: Diagnosis not present

## 2017-05-31 DIAGNOSIS — I130 Hypertensive heart and chronic kidney disease with heart failure and stage 1 through stage 4 chronic kidney disease, or unspecified chronic kidney disease: Secondary | ICD-10-CM | POA: Diagnosis present

## 2017-05-31 DIAGNOSIS — N184 Chronic kidney disease, stage 4 (severe): Secondary | ICD-10-CM

## 2017-05-31 DIAGNOSIS — E1122 Type 2 diabetes mellitus with diabetic chronic kidney disease: Secondary | ICD-10-CM | POA: Diagnosis present

## 2017-05-31 DIAGNOSIS — I2510 Atherosclerotic heart disease of native coronary artery without angina pectoris: Secondary | ICD-10-CM | POA: Diagnosis present

## 2017-05-31 DIAGNOSIS — D72829 Elevated white blood cell count, unspecified: Secondary | ICD-10-CM | POA: Diagnosis present

## 2017-05-31 DIAGNOSIS — I420 Dilated cardiomyopathy: Secondary | ICD-10-CM | POA: Diagnosis not present

## 2017-05-31 DIAGNOSIS — R109 Unspecified abdominal pain: Secondary | ICD-10-CM | POA: Diagnosis not present

## 2017-05-31 DIAGNOSIS — M109 Gout, unspecified: Secondary | ICD-10-CM | POA: Diagnosis not present

## 2017-05-31 DIAGNOSIS — I360 Nonrheumatic tricuspid (valve) stenosis: Secondary | ICD-10-CM | POA: Diagnosis not present

## 2017-05-31 DIAGNOSIS — Z9114 Patient's other noncompliance with medication regimen: Secondary | ICD-10-CM | POA: Diagnosis not present

## 2017-05-31 DIAGNOSIS — R791 Abnormal coagulation profile: Secondary | ICD-10-CM | POA: Diagnosis present

## 2017-05-31 LAB — GLUCOSE, CAPILLARY
GLUCOSE-CAPILLARY: 101 mg/dL — AB (ref 65–99)
GLUCOSE-CAPILLARY: 148 mg/dL — AB (ref 65–99)
GLUCOSE-CAPILLARY: 126 mg/dL — AB (ref 65–99)
Glucose-Capillary: 125 mg/dL — ABNORMAL HIGH (ref 65–99)

## 2017-05-31 LAB — COMPREHENSIVE METABOLIC PANEL
ALBUMIN: 2.7 g/dL — AB (ref 3.5–5.0)
ALT: 15 U/L — ABNORMAL LOW (ref 17–63)
ANION GAP: 6 (ref 5–15)
AST: 34 U/L (ref 15–41)
Alkaline Phosphatase: 117 U/L (ref 38–126)
BUN: 41 mg/dL — ABNORMAL HIGH (ref 6–20)
CHLORIDE: 108 mmol/L (ref 101–111)
CO2: 22 mmol/L (ref 22–32)
Calcium: 8.4 mg/dL — ABNORMAL LOW (ref 8.9–10.3)
Creatinine, Ser: 2.22 mg/dL — ABNORMAL HIGH (ref 0.61–1.24)
GFR calc Af Amer: 36 mL/min — ABNORMAL LOW (ref >60)
GFR calc non Af Amer: 31 mL/min — ABNORMAL LOW (ref >60)
GLUCOSE: 107 mg/dL — AB (ref 65–99)
POTASSIUM: 4.1 mmol/L (ref 3.5–5.1)
SODIUM: 136 mmol/L (ref 135–145)
Total Bilirubin: 2.5 mg/dL — ABNORMAL HIGH (ref 0.3–1.2)
Total Protein: 7.1 g/dL (ref 6.5–8.1)

## 2017-05-31 LAB — HEPARIN LEVEL (UNFRACTIONATED): HEPARIN UNFRACTIONATED: 0.23 [IU]/mL — AB (ref 0.30–0.70)

## 2017-05-31 LAB — PROTIME-INR
INR: 1.48
PROTHROMBIN TIME: 18.1 s — AB (ref 11.4–15.2)

## 2017-05-31 LAB — PROTEIN, TOTAL: Total Protein: 7.1 g/dL (ref 6.5–8.1)

## 2017-05-31 MED ORDER — CARVEDILOL 25 MG PO TABS
25.0000 mg | ORAL_TABLET | Freq: Two times a day (BID) | ORAL | Status: DC
  Administered 2017-05-31 – 2017-06-02 (×4): 25 mg via ORAL
  Filled 2017-05-31 (×4): qty 1

## 2017-05-31 MED ORDER — HEPARIN (PORCINE) IN NACL 100-0.45 UNIT/ML-% IJ SOLN: 1200.0000 [IU]/h | INTRAMUSCULAR | Status: DC

## 2017-05-31 MED ORDER — WARFARIN SODIUM 5 MG PO TABS
5.0000 mg | ORAL_TABLET | Freq: Once | ORAL | Status: AC
  Administered 2017-05-31: 5 mg via ORAL
  Filled 2017-05-31: qty 1

## 2017-05-31 MED ORDER — FUROSEMIDE 10 MG/ML IJ SOLN
40.0000 mg | Freq: Two times a day (BID) | INTRAMUSCULAR | Status: DC
  Administered 2017-05-31 – 2017-06-02 (×5): 40 mg via INTRAVENOUS
  Filled 2017-05-31 (×5): qty 4

## 2017-05-31 MED ORDER — HEPARIN (PORCINE) IN NACL 100-0.45 UNIT/ML-% IJ SOLN
1350.0000 [IU]/h | INTRAMUSCULAR | Status: DC
  Administered 2017-05-31: 1200 [IU]/h via INTRAVENOUS
  Administered 2017-06-01 (×2): 1350 [IU]/h via INTRAVENOUS
  Filled 2017-05-31 (×3): qty 250

## 2017-05-31 NOTE — Progress Notes (Signed)
MD at bedside, stated to given IV lasix over PO lasix. MD aware pt ate breakfast this morning, MD unsure if paracentesis will be completed today or Monday. Will call IR   Trapper Meech Leory Plowman

## 2017-05-31 NOTE — Progress Notes (Signed)
Called pharmacy, pharmacist stated rocephin and heparin are compatible   Kelsee Preslar Leory Plowman

## 2017-05-31 NOTE — Progress Notes (Signed)
Paged pharmacy regarding pt scheduled for IR procedure at 1200 PM. Pharmacy representative Liane Comber stated he will reschedule IV heparin to begin after procedure   Michael [name]

## 2017-05-31 NOTE — Progress Notes (Signed)
Called IR. IR stated procedure will be today around 1200PM. There is no prep per IR and pt is okay to eat lunch. Will inform MD   Darchelle Nunes Leory Plowman

## 2017-05-31 NOTE — Progress Notes (Signed)
  I was present during limited US of the abdomen to evaluate for ascites.  There is minimal peritoneal fluid with numerous mobile loops of bowel.  There is no adequate pocket of fluid to safely proceed with paracentesis.  Houda Brau S Zienna Ahlin PA-C 05/31/2017 12:51 PM

## 2017-05-31 NOTE — Progress Notes (Addendum)
PROGRESS NOTE    Michael [name]  LFY:[id number] DOB: 27-Oct-1961 DOA: 05/30/2017 PCP: [name], MD   Chief Complaint  Patient presents with  . Emesis    Brief Narrative:  HPI on 05/30/2017 by Ms. [name], PA Michael [name] is a 56 y.o. male with medical history significant for HTN, HLD, CKD 4, COPD, CAD, PAF, pulmonary HTN, OSA , DM,  Chronic systolic CHF and liver cirrhosis presenting to the ED with increased bloating, nausea and vomiting, small amount of loose stools. Denies  radiation of the pain . Not aggravated by taking in deep breaths. No alleviating factors. Able to pass gas. Denies blood in the stools. Denies shortness of breath, cough or chest pain or palpitations . No recent trips or food poisoning. No changes in medication or over the counter products; he admits to medicine non compliance and food indiscretions. Denies a history of hepatitis. He reports bilateral lower extremity swelling. He is very fatigued. He does not smoke. No ETOH or recreational drug use. No prior abdominal surgeries. He has been admitted in the past around 02/2016 with similar symptoms  Assessment & Plan   Acute on chronic Systolic heart failure -Upon admission, BNP 1404.9, chest x-ray showed vascular congestion -Patient takes oral Lasix 80 mg twice daily at home  -Echocardiogram on 04/08/2017 showed an EF of 35-40% -Will place patient on IV Lasix, 40 mg twice a day -Monitor intake, output, daily weights -Currently on metoprolol, will transition to Coreg -Not on ACE inhibitor or ARB secondary to chronic kidney disease  Anasarca  -in the setting of liver cirrhosis and CHF exacerbation with kidney disease and hypoalbuminemia -CT abdomen and pelvis showed anasarca pattern with bilateral pleural effusions, ascites, edema -Patient was started on ceftriaxone for possible SBP (although patient currently afebrile or leukocytosis) -Pending paracentesis by interventional radiology, fluid to be  sent for labs -Will continue diuresis  Abdominal pain with nausea/vomiting -Seems to have resolved -Likely secondary to the above -Continue antiemetics as needed  Chronic kidney disease, stage IV -Baseline creatinine approximately 2.2-2.3 -Currently creatinine 2.22 -Continue to monitor BMP closely given diuresis  Essential hypertension -Wll change metoprolol to Coreg -Continue lasix  Hyperlipidemia -Continue statin   Chronic atrial fibrillation with subtherapeutic INR -CHADSVASC 3 (HTN, DM, CHF) -INR 1.48 (has been subtherapeutic since 04/06/2017) -Continue coumadin per pharmcy -Will start IV heparin given that patient is subtherapeutic  Anemia of chronic disease -Hemoglobin currently 10.9, appears to be at baseline -Continue to monitor CBC as patient will be started on IV heparin in conjunction with Coumadin  Diabetes mellitus, type II -continue levemir, ISS, CBG monitoring  -Last hemoglobin A1c 6 (01/16/2017)  GERD -Continue PPI  History of liver cirrhosis -AST and ALT appear to be within normal limits, however total bili remained mildly elevated but trending downward -Patient will need follow-up with gastroenterology as an outpatient  DVT Prophylaxis  Coumadin/Heaprin  Code Status: Full  Family Communication: None at bedside  Disposition Plan: Currently in observation. Home when stable  Consultants Interventional radiology  Procedures  None  Antibiotics   Anti-infectives    Start     Dose/Rate Route Frequency Ordered Stop   05/30/17 1815  cefTRIAXone (ROCEPHIN) 2 g in dextrose 5 % 50 mL IVPB     2 g 100 mL/hr over 30 Minutes Intravenous Daily-1800 05/30/17 1809        Subjective:   Michael [name] seen and examined today.  Patient feels his nausea and abdominal pain with swelling has improved since being  admitted. Denies any chest pain, shortness of breath, dizziness, headache.  Objective:   Vitals:   05/30/17 2217 05/31/17 0526 05/31/17  0837 05/31/17 1156  BP: 99/74 108/67 117/73 108/73  Pulse: 87 84 100 86  Resp:  18 18 18   Temp:  98.2 F (36.8 C) 98.1 F (36.7 C) 98.4 F (36.9 C)  TempSrc:  Oral Oral Oral  SpO2:  99% 98% 100%  Weight:  82 kg (180 lb 12.8 oz)    Height:        Intake/Output Summary (Last 24 hours) at 05/31/17 1203 Last data filed at 05/31/17 0847  Gross per 24 hour  Intake              600 ml  Output             3000 ml  Net            -2400 ml   Filed Weights   05/30/17 1614 05/31/17 0526  Weight: 81.2 kg (179 lb) 82 kg (180 lb 12.8 oz)    Exam  General: Well developed, ill-appearing, NAD, appears stated age  HEENT: NCAT, mucous membranes moist.   Cardiovascular: S1 S2 auscultated, irregular, 2/6 SEM  Respiratory: Clear to auscultation bilaterally with equal chest rise  Abdomen: Soft, nontender, distended, + bowel sounds  Extremities: warm dry without cyanosis clubbing. 2+ LE Edema  Neuro: AAOx3, nonfocal  Psych: Normal affect and demeanor with intact judgement and insight   Data Reviewed: I have personally reviewed following labs and imaging studies  CBC:  Recent Labs Lab 05/30/17 0545  WBC 6.7  HGB 10.9*  HCT 32.8*  MCV 75.6*  PLT 494   Basic Metabolic Panel:  Recent Labs Lab 05/30/17 0545 05/31/17 0350  NA 137 136  K 4.1 4.1  CL 106 108  CO2 23 22  GLUCOSE 110* 107*  BUN 42* 41*  CREATININE 2.37* 2.22*  CALCIUM 9.0 8.4*   GFR: Estimated Creatinine Clearance: 36.2 mL/min (A) (by C-G formula based on SCr of 2.22 mg/dL (H)). Liver Function Tests:  Recent Labs Lab 05/30/17 0545 05/31/17 0350  AST 40 34  ALT 17 15*  ALKPHOS 158* 117  BILITOT 3.6* 2.5*  PROT 8.1 7.1  ALBUMIN 3.2* 2.7*    Recent Labs Lab 05/30/17 0545  LIPASE 87*   No results for input(s): AMMONIA in the last 168 hours. Coagulation Profile:  Recent Labs Lab 05/30/17 0900 05/31/17 0350  INR 1.42 1.48   Cardiac Enzymes: No results for input(s): CKTOTAL, CKMB,  CKMBINDEX, TROPONINI in the last 168 hours. BNP (last 3 results)  Recent Labs  03/16/17 1401  PROBNP 6,530*   HbA1C: No results for input(s): HGBA1C in the last 72 hours. CBG:  Recent Labs Lab 05/30/17 1651 05/30/17 2212 05/31/17 0741 05/31/17 1101  GLUCAP 99 102* 101* 148*   Lipid Profile: No results for input(s): CHOL, HDL, LDLCALC, TRIG, CHOLHDL, LDLDIRECT in the last 72 hours. Thyroid Function Tests:  Recent Labs  05/30/17 1538  TSH 2.924   Anemia Panel: No results for input(s): VITAMINB12, FOLATE, FERRITIN, TIBC, IRON, RETICCTPCT in the last 72 hours. Urine analysis:    Component Value Date/Time   COLORURINE YELLOW 05/30/2017 0538   APPEARANCEUR CLEAR 05/30/2017 0538   LABSPEC 1.013 05/30/2017 0538   PHURINE 5.0 05/30/2017 0538   GLUCOSEU NEGATIVE 05/30/2017 0538   HGBUR SMALL (A) 05/30/2017 [place] NEGATIVE 05/30/2017 [employer] 05/30/2017 0538   PROTEINUR 100 (A) 05/30/2017 4967  NITRITE NEGATIVE 05/30/2017 0538   LEUKOCYTESUR TRACE (A) 05/30/2017 0538   Sepsis Labs: @LABRCNTIP (procalcitonin:4,lacticidven:4)  )No results found for this or any previous visit (from the past 240 hour(s)).    Radiology Studies: Ct Abdomen Pelvis Wo Contrast  Result Date: 05/30/2017 CLINICAL DATA:  Nausea, vomiting, diarrhea, abdominal pain EXAM: CT ABDOMEN AND PELVIS WITHOUT CONTRAST TECHNIQUE: Multidetector CT imaging of the abdomen and pelvis was performed following the standard protocol without IV contrast. COMPARISON:  02/25/2016 FINDINGS: Lower chest: Cardiomegaly. Small to moderate bilateral pleural effusions, right greater than left. Bibasilar atelectasis. Hepatobiliary: No focal hepatic abnormality. Gallbladder unremarkable. Pancreas: No focal abnormality or ductal dilatation. Spleen: No focal abnormality.  Normal size. Adrenals/Urinary Tract: Large early staghorn calculus in the left kidney, the largest area measuring up to 2 cm in the left  renal pelvis. No hydronephrosis. No ureteral stones. Adrenal glands and urinary bladder unremarkable. Stomach/Bowel: Stomach, large and small bowel grossly unremarkable. Vascular/Lymphatic: Aortic and iliac calcifications. No aneurysm or adenopathy. Reproductive: No visible focal abnormality. Other: Moderate perihepatic and perisplenic free fluid. Small amount of free fluid in the cul-de-sac. There is diffuse edema throughout the mesentery and subcutaneous soft tissues. Musculoskeletal: No acute bony abnormality. IMPRESSION: Anasarca pattern with bilateral pleural effusions, ascites, and edema throughout the mesentery and subcutaneous soft tissues. Cardiomegaly. Left renal stones.  No hydronephrosis. No evidence of bowel obstruction. Electronically Signed   By: Rolm Baptise M.D.   On: 05/30/2017 11:01   Dg Chest 2 View  Result Date: 05/31/2017 CLINICAL DATA:  Nausea and vomiting.  Abdominal pain. EXAM: CHEST  2 VIEW COMPARISON:  05/30/2017. FINDINGS: Markedly enlarged cardiomediastinal silhouette. Bibasilar subsegmental atelectasis, and mild edema with small effusions, overall improved from yesterday's radiograph. IMPRESSION: Improved aeration. Electronically Signed   By: Staci Righter M.D.   On: 05/31/2017 10:23   Dg Chest 2 View  Result Date: 05/30/2017 CLINICAL DATA:  Nausea, vomiting, diarrhea, fatigue and unable to sleep last night, feels swollen, fever subjectively, history hypertension, CHF, asthma, diabetes mellitus, former smoker EXAM: CHEST  2 VIEW COMPARISON:  10/05/2016 FINDINGS: Enlargement of cardiac silhouette post MVR with mild pulmonary vascular congestion. Mediastinal contours normal. Atherosclerotic calcification aorta. Mild perihilar to basilar infiltrates, slightly greater on RIGHT than LEFT, favor asymmetric pulmonary edema. Suspect small pleural effusions bilaterally. No pneumothorax. Bones demineralized. IMPRESSION: Enlargement of cardiac silhouette with pulmonary vascular congestion.  Probable mild pulmonary edema and bibasilar effusions. Aortic Atherosclerosis (ICD10-I70.0). Electronically Signed   By: Lavonia Dana M.D.   On: 05/30/2017 11:42     Scheduled Meds: . atorvastatin  20 mg Oral Daily  . carvedilol  25 mg Oral BID WC  . folic acid  1 mg Oral Daily  . furosemide  40 mg Intravenous BID  . insulin aspart  0-9 Units Subcutaneous TID WC  . insulin detemir  5 Units Subcutaneous QHS  . pantoprazole  40 mg Oral Daily  . sodium chloride flush  3 mL Intravenous Q12H  . warfarin  5 mg Oral ONCE-1800  . Warfarin - Pharmacist Dosing Inpatient   Does not apply q1800   Continuous Infusions: . sodium chloride    . cefTRIAXone (ROCEPHIN)  IV Stopped (05/30/17 2037)  . heparin       LOS: 0 days   Time Spent in minutes   30 minutes  Reighlynn Swiney D.O. on 05/31/2017 at 12:03 PM  Between 7am to 7pm - Pager - [phone]  After 7pm go to [email or web address] - password [email or web address]  And look for the  night coverage person covering for me after hours  [facility] Office  [phone]

## 2017-05-31 NOTE — Progress Notes (Signed)
Pt's blood pressure was low, HR was normal. MD notified as this pt has order for high dose of betablocker. MD on call (Dr. Threasa Alpha) ordered that one dose tonight will be held tonight, to be resumed in am.

## 2017-05-31 NOTE — Progress Notes (Signed)
ANTICOAGULATION CONSULT NOTE - Initial Consult  Pharmacy Consult for Coumadin and Heparin  Indication: atrial fibrillation  No Known Allergies  Patient Measurements: Height: 5\' 5"  (165.1 cm) Weight: 180 lb 12.8 oz (82 kg) IBW/kg (Calculated) : 61.5  Heparin Dosing Weight: 78.2 kg  Vital Signs: Temp: 98.1 F (36.7 C) (07/15 0837) Temp Source: Oral (07/15 0837) BP: 117/73 (07/15 0837) Pulse Rate: 100 (07/15 0837)  Labs:  Recent Labs  05/30/17 0545 05/30/17 0900 05/31/17 0350  HGB 10.9*  --   --   HCT 32.8*  --   --   PLT 182  --   --   LABPROT  --  17.5* 18.1*  INR  --  1.42 1.48  CREATININE 2.37*  --  2.22*    Estimated Creatinine Clearance: 36.2 mL/min (A) (by C-G formula based on SCr of 2.22 mg/dL (H)).  Assessment: Michael [name] is a 56 y/o M who presented with nausea, vomitting, and abdominal pain. Pharmacy was consulted to dose his coumadin that he is on chronically secondary to Afib. His PTA dose is 2.5 mg daily, but he endorses non-compliance and his admit INR was subtherapeutic at 1.42 and his last dose was taken on 07/12. He has a CHADSVASc of 3. His Afib is rate controlled with Lopressor and is in NSR.  INR trend since 07/14:  1.42 (5mg  x1)>>1.48 on 07/15 Hgb is 10.9, PLTs 182, and no bleeding noted Secondary to the subtherapeutic INR, heparin will be used to bridge until a therapeutic INR is achieved for two consecutive days. Decided against bolus due to liver cirrhosis, CKD 4, and CHADSVASc of 3 as we may increase risk greater than benefit.  Goal of Therapy:  INR 2-3 Heparin level 0.3-0.7 units/ml Monitor platelets by anticoagulation protocol: Yes   Plan:  Heparin 1200 unit/hr infusion Heparin level 6 hours post initiation (2000) Warfarin 5 mg x 1 Daily Heparin level, INR and CBC Monitor for S/sx of bleeding   Patterson Hammersmith PharmD PGY1 Pharmacy Practice Resident 05/31/2017 10:09 AM Pager: [phone]

## 2017-05-31 NOTE — Progress Notes (Signed)
ANTICOAGULATION CONSULT NOTE - Follow-up Consult  Pharmacy Consult for Coumadin and Heparin  Indication: atrial fibrillation  No Known Allergies  Patient Measurements: Height: 5\' 5"  (165.1 cm) Weight: 180 lb 12.8 oz (82 kg) IBW/kg (Calculated) : 61.5  Heparin Dosing Weight: 78.2 kg  Vital Signs: Temp: 98.9 F (37.2 C) (07/15 2005) Temp Source: Oral (07/15 2005) BP: 91/57 (07/15 2005) Pulse Rate: 83 (07/15 2005)  Labs:  Recent Labs  05/30/17 0545 05/30/17 0900 05/31/17 0350 05/31/17 1943  HGB 10.9*  --   --   --   HCT 32.8*  --   --   --   PLT 182  --   --   --   LABPROT  --  17.5* 18.1*  --   INR  --  1.42 1.48  --   HEPARINUNFRC  --   --   --  0.23*  CREATININE 2.37*  --  2.22*  --     Estimated Creatinine Clearance: 36.2 mL/min (A) (by C-G formula based on SCr of 2.22 mg/dL (H)).  Assessment: Mr. Hollibaugh is a 56 y/o M who presented with nausea, vomitting, and abdominal pain. Pharmacy was consulted to dose his coumadin that he is on chronically secondary to Afib. His PTA dose is 2.5 mg daily, but he endorses non-compliance and his admit INR was subtherapeutic at 1.42 and his last dose was taken on 07/12. He has a CHADSVASc of 3. His Afib is rate controlled with Lopressor and is in NSR.  INR trend since 07/14:  1.42 (5mg  x1)>>1.48 on 07/15 Hgb is 10.9, PLTs 182, and no bleeding noted  Heparin level subtherapeutic: 0.23  Goal of Therapy:  INR 2-3 Heparin level 0.3-0.7 units/ml Monitor platelets by anticoagulation protocol: Yes   Plan:  Increase heparin gtt to 1350 units/hr  Warfarin 5 mg x 1 Daily Heparin level, INR and CBC Monitor for S/sx of bleeding   Georga Bora, PharmD Clinical Pharmacist 05/31/2017 9:44 PM

## 2017-05-31 NOTE — Progress Notes (Addendum)
Pt returned from ultrasound, called IR, technician stated there was not enough fluid to be removed. Pt back in bed resting  MD aware  Michael [name]

## 2017-06-01 ENCOUNTER — Inpatient Hospital Stay (HOSPITAL_COMMUNITY): Payer: Medicare Other

## 2017-06-01 ENCOUNTER — Ambulatory Visit (HOSPITAL_COMMUNITY): Payer: Medicare Other

## 2017-06-01 DIAGNOSIS — E8779 Other fluid overload: Secondary | ICD-10-CM

## 2017-06-01 DIAGNOSIS — M109 Gout, unspecified: Secondary | ICD-10-CM

## 2017-06-01 DIAGNOSIS — R52 Pain, unspecified: Secondary | ICD-10-CM

## 2017-06-01 DIAGNOSIS — I360 Nonrheumatic tricuspid (valve) stenosis: Secondary | ICD-10-CM

## 2017-06-01 LAB — CBC
HEMATOCRIT: 28.4 % — AB (ref 39.0–52.0)
Hemoglobin: 9.6 g/dL — ABNORMAL LOW (ref 13.0–17.0)
MCH: 25.1 pg — AB (ref 26.0–34.0)
MCHC: 33.8 g/dL (ref 30.0–36.0)
MCV: 74.3 fL — AB (ref 78.0–100.0)
Platelets: 155 10*3/uL (ref 150–400)
RBC: 3.82 MIL/uL — ABNORMAL LOW (ref 4.22–5.81)
RDW: 19.2 % — AB (ref 11.5–15.5)
WBC: 3.8 10*3/uL — ABNORMAL LOW (ref 4.0–10.5)

## 2017-06-01 LAB — GLUCOSE, CAPILLARY
GLUCOSE-CAPILLARY: 143 mg/dL — AB (ref 65–99)
Glucose-Capillary: 118 mg/dL — ABNORMAL HIGH (ref 65–99)
Glucose-Capillary: 124 mg/dL — ABNORMAL HIGH (ref 65–99)
Glucose-Capillary: 177 mg/dL — ABNORMAL HIGH (ref 65–99)

## 2017-06-01 LAB — URIC ACID: URIC ACID, SERUM: 12.2 mg/dL — AB (ref 4.4–7.6)

## 2017-06-01 LAB — COMPREHENSIVE METABOLIC PANEL
ALK PHOS: 108 U/L (ref 38–126)
ALT: 13 U/L — AB (ref 17–63)
AST: 28 U/L (ref 15–41)
Albumin: 2.7 g/dL — ABNORMAL LOW (ref 3.5–5.0)
Anion gap: 8 (ref 5–15)
BILIRUBIN TOTAL: 2.2 mg/dL — AB (ref 0.3–1.2)
BUN: 41 mg/dL — ABNORMAL HIGH (ref 6–20)
CALCIUM: 8.6 mg/dL — AB (ref 8.9–10.3)
CO2: 24 mmol/L (ref 22–32)
CREATININE: 2.17 mg/dL — AB (ref 0.61–1.24)
Chloride: 104 mmol/L (ref 101–111)
GFR, EST AFRICAN AMERICAN: 37 mL/min — AB (ref >60)
GFR, EST NON AFRICAN AMERICAN: 32 mL/min — AB (ref >60)
Glucose, Bld: 119 mg/dL — ABNORMAL HIGH (ref 65–99)
Potassium: 3.8 mmol/L (ref 3.5–5.1)
SODIUM: 136 mmol/L (ref 135–145)
TOTAL PROTEIN: 7.2 g/dL (ref 6.5–8.1)

## 2017-06-01 LAB — HEPARIN LEVEL (UNFRACTIONATED)
Heparin Unfractionated: 0.46 IU/mL (ref 0.30–0.70)
Heparin Unfractionated: 0.54 IU/mL (ref 0.30–0.70)

## 2017-06-01 LAB — ECHOCARDIOGRAM COMPLETE
HEIGHTINCHES: 65 in
Weight: 2932.8 oz

## 2017-06-01 LAB — PROTIME-INR
INR: 1.96
Prothrombin Time: 22.6 seconds — ABNORMAL HIGH (ref 11.4–15.2)

## 2017-06-01 MED ORDER — WARFARIN SODIUM 5 MG PO TABS
5.0000 mg | ORAL_TABLET | Freq: Once | ORAL | Status: AC
  Administered 2017-06-01: 5 mg via ORAL
  Filled 2017-06-01: qty 1

## 2017-06-01 MED ORDER — TRAMADOL HCL 50 MG PO TABS: 50.0000 mg | ORAL_TABLET | Freq: Four times a day (QID) | ORAL | Status: DC | PRN

## 2017-06-01 MED ORDER — COLCHICINE 0.6 MG PO TABS
1.2000 mg | ORAL_TABLET | Freq: Once | ORAL | Status: AC
  Administered 2017-06-01: 1.2 mg via ORAL
  Filled 2017-06-01: qty 2

## 2017-06-01 MED ORDER — HYDROCODONE-ACETAMINOPHEN 5-325 MG PO TABS
2.0000 | ORAL_TABLET | Freq: Once | ORAL | Status: AC
  Administered 2017-06-01: 2 via ORAL
  Filled 2017-06-01: qty 2

## 2017-06-01 NOTE — Evaluation (Signed)
Physical Therapy Evaluation Patient Details Name: Michael [name] MRN: [id number] DOB: 07-22-1961 Today's Date: 06/01/2017   History of Present Illness  Michael [name] is a 56 y.o. male with medical history significant for HTN, HLD, CKD 4, COPD, CAD, PAF, pulmonary HTN, OSA , DM,  Chronic systolic CHF and liver cirrhosis presenting to the ED with increased bloating, nausea and vomiting, small amount of loose stools.CHF with afib.   Clinical Impression  Pt admitted with above diagnosis. Pt currently with functional limitations due to the deficits listed below (see PT Problem List). Pt was able to ambulate with min guard to supervision.  Pt occasionally drifts but no LOB with widened BOS.  Pt states he is at his baseline.  Will follow acutely.  May benefit from using RW at home intially for safety.  Pt will benefit from skilled PT to increase their independence and safety with mobility to allow discharge to the venue listed below.      Follow Up Recommendations Home health PT;Supervision - Intermittent    Equipment Recommendations  Rolling walker with 5" wheels (chart states pt has RW but pt states he does not)    Recommendations for Other Services       Precautions / Restrictions Precautions Precautions: Fall Restrictions Weight Bearing Restrictions: No      Mobility  Bed Mobility Overal bed mobility: Independent                Transfers Overall transfer level: Independent                  Ambulation/Gait Ambulation/Gait assistance: Supervision;Min guard Ambulation Distance (Feet): 100 Feet Assistive device: None Gait Pattern/deviations: Step-through pattern;Decreased stride length   Gait velocity interpretation: Below normal speed for age/gender General Gait Details: Pt was able to ambulate in halls without physical assist.  No LOB as well.  Occasiaonlly drifts but no significant LOB.  Pt does widen BOS a bit with ambulation but pt states he is at  baseline. Pt also fatigued at desk and asked to go back to room.  States he can't sleep here.   Stairs            Wheelchair Mobility    Modified Rankin (Stroke Patients Only)       Balance Overall balance assessment: Needs assistance Sitting-balance support: No upper extremity supported;Feet supported Sitting balance-Leahy Scale: Good     Standing balance support: During functional activity;No upper extremity supported Standing balance-Leahy Scale: Fair Standing balance comment: can stand statically without UE support.                              Pertinent Vitals/Pain Pain Assessment: No/denies pain    Home Living Family/patient expects to be discharged to:: Private residence Living Arrangements: Other (Comment) (friend) Available Help at Discharge: Friend(s);Available PRN/intermittently Type of Home: Apartment Home Access: Stairs to enter Entrance Stairs-Rails: None Entrance Stairs-Number of Steps: 2 Home Layout: One level Home Equipment: Cane - single point      Prior Function Level of Independence: Independent with assistive device(s)         Comments: On disability following heart surgery     Hand Dominance   Dominant Hand: Right    Extremity/Trunk Assessment   Upper Extremity Assessment Upper Extremity Assessment: Defer to OT evaluation    Lower Extremity Assessment Lower Extremity Assessment: Generalized weakness    Cervical / Trunk Assessment Cervical / Trunk Assessment: Normal  Communication  Communication: No difficulties  Cognition Arousal/Alertness: Awake/alert Behavior During Therapy: Flat affect Overall Cognitive Status: Within Functional Limits for tasks assessed                                        General Comments      Exercises     Assessment/Plan    PT Assessment Patient needs continued PT services  PT Problem List Decreased activity tolerance;Decreased balance;Decreased  mobility;Decreased safety awareness;Decreased knowledge of use of DME;Decreased knowledge of precautions       PT Treatment Interventions DME instruction;Gait training;Functional mobility training;Stair training;Therapeutic activities;Therapeutic exercise;Balance training;Patient/family education    PT Goals (Current goals can be found in the Care Plan section)  Acute Rehab PT Goals Patient Stated Goal: to get better PT Goal Formulation: With patient Time For Goal Achievement: 06/15/17 Potential to Achieve Goals: Good    Frequency Min 3X/week   Barriers to discharge        Co-evaluation               AM-PAC PT "6 Clicks" Daily Activity  Outcome Measure Difficulty turning over in bed (including adjusting bedclothes, sheets and blankets)?: None Difficulty moving from lying on back to sitting on the side of the bed? : None Difficulty sitting down on and standing up from a chair with arms (e.g., wheelchair, bedside commode, etc,.)?: None Help needed moving to and from a bed to chair (including a wheelchair)?: A Little Help needed walking in hospital room?: A Little Help needed climbing 3-5 steps with a railing? : A Lot 6 Click Score: 20    End of Session Equipment Utilized During Treatment: Gait belt Activity Tolerance: Patient limited by fatigue Patient left: in bed;with call bell/phone within reach Nurse Communication: Mobility status PT Visit Diagnosis: Unsteadiness on feet (R26.81);Muscle weakness (generalized) (M62.81)    Time: 4383-8184 PT Time Calculation (min) (ACUTE ONLY): 12 min   Charges:   PT Evaluation $PT Eval Moderate Complexity: 1 Procedure     PT G Codes:        Delayni Streed,PT Acute Rehabilitation [phone]   Denice Paradise 06/01/2017, 3:20 PM

## 2017-06-01 NOTE — Progress Notes (Signed)
Pt stated later. I set up pt due to be independent

## 2017-06-01 NOTE — Progress Notes (Signed)
  Echocardiogram 2D Echocardiogram has been performed.  Michael [name] L Androw 06/01/2017, 2:13 PM

## 2017-06-01 NOTE — Progress Notes (Signed)
Pt c/o of left hand pain, pt's hand warm to touch. Lamar Blinks NP notified, orders made.

## 2017-06-01 NOTE — Progress Notes (Signed)
[place] for warfarin and heparin  Indication: atrial fibrillation  No Known Allergies  Patient Measurements: Height: 5\' 5"  (165.1 cm) Weight: 183 lb 4.8 oz (83.1 kg) IBW/kg (Calculated) : 61.5  Heparin Dosing Weight: 78.2 kg  Vital Signs: Temp: 97.4 F (36.3 C) (07/16 0327) Temp Source: Oral (07/16 0327) BP: 96/68 (07/16 0555) Pulse Rate: 116 (07/16 0555)   Recent Labs  05/30/17 0545 05/30/17 0900 05/31/17 0350 05/31/17 1943 06/01/17 0418 06/01/17 0900  HGB 10.9*  --   --   --  9.6*  --   HCT 32.8*  --   --   --  28.4*  --   PLT 182  --   --   --  155  --   LABPROT  --  17.5* 18.1*  --  22.6*  --   INR  --  1.42 1.48  --  1.96  --   HEPARINUNFRC  --   --   --  0.23* 0.46 0.54  CREATININE 2.37*  --  2.22*  --  2.17*  --     Assessment:  56 y/o M who presented with nausea, vomitting, and abdominal pain. Pt endorses non-compliance as his admit INR was subtherapeutic at 1.42. Current INR = 1.9.  PTA warfarin regimen from most recent anticoag outpatient visit is: 2.5 mg on Sun/Tue/Thurs and 5 mg on all other days  CBC stable, no overt s/s bleeding noted. Confirmatory heparin level remains therapeutic.  Goal of Therapy:  INR 2-3 Heparin level 0.3-0.7 units/ml Monitor platelets by anticoagulation protocol: Yes   Plan:  Continue heparin gtt at 1350 units/hr  Warfarin 5 mg po x1 then resume home regimen Daily Heparin level, INR and CBC Monitor for s/sx bleeding Discontinue heparin when INR > 2    Hughes Better, PharmD, BCPS Clinical Pharmacist 06/01/2017 11:06 AM

## 2017-06-01 NOTE — Progress Notes (Signed)
PROGRESS NOTE    [name]  ESP:[id number] DOB: [birth year] DOA: 05/30/2017 PCP: [name], MD   Chief Complaint  Patient presents with  . Emesis    Brief Narrative:  HPI on 05/30/2017 by Ms. [name], PA Michael [name] is a 56 y.o. male with medical history significant for HTN, HLD, CKD 4, COPD, CAD, PAF, pulmonary HTN, OSA , DM,  Chronic systolic CHF and liver cirrhosis presenting to the ED with increased bloating, nausea and vomiting, small amount of loose stools. Denies  radiation of the pain . Not aggravated by taking in deep breaths. No alleviating factors. Able to pass gas. Denies blood in the stools. Denies shortness of breath, cough or chest pain or palpitations . No recent trips or food poisoning. No changes in medication or over the counter products; he admits to medicine non compliance and food indiscretions. Denies a history of hepatitis. He reports bilateral lower extremity swelling. He is very fatigued. He does not smoke. No ETOH or recreational drug use. No prior abdominal surgeries. He has been admitted in the past around 02/2016 with similar symptoms  Assessment & Plan   Acute on chronic Systolic heart failure -Upon admission, BNP 1404.9, chest x-ray showed vascular congestion -Patient takes oral Lasix 80 mg twice daily at home  -Echocardiogram on 04/08/2017 showed an EF of 35-40% -Will place patient on IV Lasix, 40 mg twice a day -Monitor intake, output, daily weights -Urine output over the past 24 hours 2200cc -Metoprolol discontinued and patient placed on coreg -Not on ACE inhibitor or ARB secondary to chronic kidney disease  Anasarca  -in the setting of liver cirrhosis and CHF exacerbation with kidney disease and hypoalbuminemia -CT abdomen and pelvis showed anasarca pattern with bilateral pleural effusions, ascites, edema -Patient was started on ceftriaxone for possible SBP (although patient currently afebrile or leukocytosis) -Paracentesis not  done as patient had only minimal peritoneal fluid with numerous mobile loops of bowel. There was no adequate pocket of fluid to safely proceed with paracentesis -Will continue diuresis  Abdominal pain with nausea/vomiting -Seems to have resolved -Likely secondary to the above -Continue antiemetics as needed  Chronic kidney disease, stage IV -Baseline creatinine approximately 2.2-2.3 -Currently creatinine 2.17 -Continue to monitor BMP closely given diuresis  Essential hypertension -Changed metoprolol to coreg -Continue lasix  Hyperlipidemia -Continue statin   Chronic atrial fibrillation with subtherapeutic INR -CHADSVASC 3 (HTN, DM, CHF) -INR 1.96 (has been subtherapeutic since 04/06/2017) -Continue coumadin and heparin bridge per pharmcy  Anemia of chronic disease -Hemoglobin currently 9.6, baseline hemoglobin between 9-10 -Continue to monitor CBC as patient will be started on IV heparin in conjunction with Coumadin  Diabetes mellitus, type II -continue levemir, ISS, CBG monitoring  -Last hemoglobin A1c 6 (01/16/2017)  GERD -Continue PPI  History of liver cirrhosis -AST and ALT appear to be within normal limits, however total bili remained mildly elevated but trending downward -Patient will need follow-up with gastroenterology as an outpatient  Left 5th digit pain/edema -possibly secondary to Gout flare (though this usually happens in patient's foot) -patient is on IV lasix which may exacerbate gout -will obtain uric acid level (although may not be helpful if truly a gout attack/flare -will order tramadol for pain -will order colchicine 1.2mg  today -left hand xray ordered, patient denies trauma or injury  Deconditioning -likely due to comorbidities -PT consulted   DVT Prophylaxis  Coumadin/Heaprin  Code Status: Full  Family Communication: None at bedside  Disposition Plan: Admitted. Suspect home when stable  Consultants  Interventional radiology  Procedures   Abd [place]  Antibiotics   Anti-infectives    Start     Dose/Rate Route Frequency Ordered Stop   05/30/17 1815  cefTRIAXone (ROCEPHIN) 2 g in dextrose 5 % 50 mL IVPB     2 g 100 mL/hr over 30 Minutes Intravenous Daily-1800 05/30/17 1809        Subjective:   Michael [name] seen and examined today.  Patient feels abdominal pain and swelling have improved. Complains of left, fifth digit pain, and swelling. States he is unable to bend that finger. Pain started yesterday evening. Denies any recent trauma or injury to this finger or hand. Does have history of gout but states it usually affects his feet and toes. Currently denies chest pain, shortness of breath, abdominal pain, nausea, vomiting, diarrhea or constipation, dizziness or headache.  Objective:   Vitals:   06/01/17 0327 06/01/17 0500 06/01/17 0555 06/01/17 0556  BP: 114/77  96/68   Pulse: 65  (!) 116   Resp: 18     Temp: (!) 97.4 F (36.3 C)     TempSrc: Oral     SpO2: 95%   98%  Weight:  83.1 kg (183 lb 4.8 oz)    Height:        Intake/Output Summary (Last 24 hours) at 06/01/17 1019 Last data filed at 06/01/17 0606  Gross per 24 hour  Intake            948.4 ml  Output             1900 ml  Net           -951.6 ml   Filed Weights   05/30/17 1614 05/31/17 0526 06/01/17 0500  Weight: 81.2 kg (179 lb) 82 kg (180 lb 12.8 oz) 83.1 kg (183 lb 4.8 oz)   Exam  General: Well developed, well nourished, NAD, appears stated age  HEENT: NCAT, mucous membranes moist.   Cardiovascular: S1 S2 auscultated, irregular, 2/6 SEM  Respiratory: Clear to auscultation bilaterally with equal chest rise  Abdomen: Soft, nontender, mildly distended, + bowel sounds  Extremities: warm dry without cyanosis clubbing. +LE edema. Left 5th digit edema/erythema-TTP  Neuro: AAOx3, nonfocal  Psych: Appropriate mood and affect  Data Reviewed: I have personally reviewed following labs and imaging studies  CBC:  Recent Labs Lab  05/30/17 0545 06/01/17 0418  WBC 6.7 3.8*  HGB 10.9* 9.6*  HCT 32.8* 28.4*  MCV 75.6* 74.3*  PLT 182 413   Basic Metabolic Panel:  Recent Labs Lab 05/30/17 0545 05/31/17 0350 06/01/17 0418  NA 137 136 136  K 4.1 4.1 3.8  CL 106 108 104  CO2 23 22 24   GLUCOSE 110* 107* 119*  BUN 42* 41* 41*  CREATININE 2.37* 2.22* 2.17*  CALCIUM 9.0 8.4* 8.6*   GFR: Estimated Creatinine Clearance: 37.2 mL/min (A) (by C-G formula based on SCr of 2.17 mg/dL (H)). Liver Function Tests:  Recent Labs Lab 05/30/17 0545 05/31/17 0350 05/31/17 1320 06/01/17 0418  AST 40 34  --  28  ALT 17 15*  --  13*  ALKPHOS 158* 117  --  108  BILITOT 3.6* 2.5*  --  2.2*  PROT 8.1 7.1 7.1 7.2  ALBUMIN 3.2* 2.7*  --  2.7*    Recent Labs Lab 05/30/17 0545  LIPASE 87*   No results for input(s): AMMONIA in the last 168 hours. Coagulation Profile:  Recent Labs Lab 05/30/17 0900 05/31/17 0350 06/01/17 0418  INR 1.42  1.48 1.96   Cardiac Enzymes: No results for input(s): CKTOTAL, CKMB, CKMBINDEX, TROPONINI in the last 168 hours. BNP (last 3 results)  Recent Labs  03/16/17 1401  PROBNP 6,530*   HbA1C: No results for input(s): HGBA1C in the last 72 hours. CBG:  Recent Labs Lab 05/31/17 0741 05/31/17 1101 05/31/17 1647 05/31/17 2143 06/01/17 0724  GLUCAP 101* 148* 125* 126* 118*   Lipid Profile: No results for input(s): CHOL, HDL, LDLCALC, TRIG, CHOLHDL, LDLDIRECT in the last 72 hours. Thyroid Function Tests:  Recent Labs  05/30/17 1538  TSH 2.924   Anemia Panel: No results for input(s): VITAMINB12, FOLATE, FERRITIN, TIBC, IRON, RETICCTPCT in the last 72 hours. Urine analysis:    Component Value Date/Time   COLORURINE YELLOW 05/30/2017 0538   APPEARANCEUR CLEAR 05/30/2017 0538   LABSPEC 1.013 05/30/2017 0538   PHURINE 5.0 05/30/2017 0538   GLUCOSEU NEGATIVE 05/30/2017 0538   HGBUR SMALL (A) 05/30/2017 0538   BILIRUBINUR NEGATIVE 05/30/2017 0538   KETONESUR NEGATIVE  05/30/2017 0538   PROTEINUR 100 (A) 05/30/2017 0538   NITRITE NEGATIVE 05/30/2017 0538   LEUKOCYTESUR TRACE (A) 05/30/2017 0538   Sepsis Labs: @LABRCNTIP (procalcitonin:4,lacticidven:4)  )No results found for this or any previous visit (from the past 240 hour(s)).    Radiology Studies: Ct Abdomen Pelvis Wo Contrast  Result Date: 05/30/2017 CLINICAL DATA:  Nausea, vomiting, diarrhea, abdominal pain EXAM: CT ABDOMEN AND PELVIS WITHOUT CONTRAST TECHNIQUE: Multidetector CT imaging of the abdomen and pelvis was performed following the standard protocol without IV contrast. COMPARISON:  02/25/2016 FINDINGS: Lower chest: Cardiomegaly. Small to moderate bilateral pleural effusions, right greater than left. Bibasilar atelectasis. Hepatobiliary: No focal hepatic abnormality. Gallbladder unremarkable. Pancreas: No focal abnormality or ductal dilatation. Spleen: No focal abnormality.  Normal size. Adrenals/Urinary Tract: Large early staghorn calculus in the left kidney, the largest area measuring up to 2 cm in the left renal pelvis. No hydronephrosis. No ureteral stones. Adrenal glands and urinary bladder unremarkable. Stomach/Bowel: Stomach, large and small bowel grossly unremarkable. Vascular/Lymphatic: Aortic and iliac calcifications. No aneurysm or adenopathy. Reproductive: No visible focal abnormality. Other: Moderate perihepatic and perisplenic free fluid. Small amount of free fluid in the cul-de-sac. There is diffuse edema throughout the mesentery and subcutaneous soft tissues. Musculoskeletal: No acute bony abnormality. IMPRESSION: Anasarca pattern with bilateral pleural effusions, ascites, and edema throughout the mesentery and subcutaneous soft tissues. Cardiomegaly. Left renal stones.  No hydronephrosis. No evidence of bowel obstruction. Electronically Signed   By: Rolm Baptise M.D.   On: 05/30/2017 11:01   Dg Chest 2 View  Result Date: 05/31/2017 CLINICAL DATA:  Nausea and vomiting.  Abdominal pain.  EXAM: CHEST  2 VIEW COMPARISON:  05/30/2017. FINDINGS: Markedly enlarged cardiomediastinal silhouette. Bibasilar subsegmental atelectasis, and mild edema with small effusions, overall improved from yesterday's radiograph. IMPRESSION: Improved aeration. Electronically Signed   By: Staci Righter M.D.   On: 05/31/2017 10:23   Dg Chest 2 View  Result Date: 05/30/2017 CLINICAL DATA:  Nausea, vomiting, diarrhea, fatigue and unable to sleep last night, feels swollen, fever subjectively, history hypertension, CHF, asthma, diabetes mellitus, former smoker EXAM: CHEST  2 VIEW COMPARISON:  10/05/2016 FINDINGS: Enlargement of cardiac silhouette post MVR with mild pulmonary vascular congestion. Mediastinal contours normal. Atherosclerotic calcification aorta. Mild perihilar to basilar infiltrates, slightly greater on RIGHT than LEFT, favor asymmetric pulmonary edema. Suspect small pleural effusions bilaterally. No pneumothorax. Bones demineralized. IMPRESSION: Enlargement of cardiac silhouette with pulmonary vascular congestion. Probable mild pulmonary edema and bibasilar effusions. Aortic Atherosclerosis (ICD10-I70.0).  Electronically Signed   By: Lavonia Dana M.D.   On: 05/30/2017 11:42   Dg Hand 2 View Left  Result Date: 06/01/2017 CLINICAL DATA:  56 year old male with a history left little finger pain EXAM: LEFT HAND - 2 VIEW COMPARISON:  None. FINDINGS: Lateral view of the fifth digit demonstrates irregularity of the cortex of the distal phalanx. No erosions. No radiopaque foreign body. No subluxation/ dislocation. Mild soft tissue swelling at the DIP of the fifth digit. IMPRESSION: Irregularity of the dorsal cortex of the distal phalanx of the left fifth digit, may reflect nondisplaced fracture or partially healed fracture. Correlation with a history of trauma may be useful. Electronically Signed   By: Corrie Mckusick D.O.   On: 06/01/2017 09:33   US Abdomen Limited  Result Date: 05/31/2017 CLINICAL DATA:   Abdominal ascites, cirrhosis, chronic kidney disease EXAM: LIMITED ABDOMEN ULTRASOUND FOR ASCITES TECHNIQUE: Limited ultrasound survey for ascites was performed in all four abdominal quadrants. COMPARISON:  05/30/2017 FINDINGS: Small amount of abdominal ascites predominately about the liver in the right upper quadrant. Hepatic cirrhosis evident. Volume of ascites is not enough to warrant therapeutic paracentesis. IMPRESSION: Small amount of abdominal ascites, not enough to warrant therapeutic paracentesis. Hepatic cirrhosis Electronically Signed   By: Jerilynn Mages.  Shick M.D.   On: 05/31/2017 13:45     Scheduled Meds: . atorvastatin  20 mg Oral Daily  . carvedilol  25 mg Oral BID WC  . folic acid  1 mg Oral Daily  . furosemide  40 mg Intravenous BID  . insulin aspart  0-9 Units Subcutaneous TID WC  . insulin detemir  5 Units Subcutaneous QHS  . pantoprazole  40 mg Oral Daily  . sodium chloride flush  3 mL Intravenous Q12H  . Warfarin - Pharmacist Dosing Inpatient   Does not apply q1800   Continuous Infusions: . sodium chloride    . cefTRIAXone (ROCEPHIN)  IV Stopped (05/31/17 1753)  . heparin 1,350 Units/hr (06/01/17 0606)     LOS: 1 day   Time Spent in minutes   30 minutes  Kamaron Deskins D.O. on 06/01/2017 at 10:19 AM  Between 7am to 7pm - Pager - [phone]  After 7pm go to [email or web address] - password [email or web address]  And look for the night coverage person covering for me after hours  [facility] Office  [phone]

## 2017-06-01 NOTE — Progress Notes (Signed)
ANTICOAGULATION CONSULT NOTE - Follow-up Consult  Pharmacy Consult for Coumadin and Heparin  Indication: atrial fibrillation  No Known Allergies  Patient Measurements: Height: 5\' 5"  (165.1 cm) Weight: 183 lb 4.8 oz (83.1 kg) IBW/kg (Calculated) : 61.5  Heparin Dosing Weight: 78.2 kg  Vital Signs: Temp: 97.4 F (36.3 C) (07/16 0327) Temp Source: Oral (07/16 0327) BP: 114/77 (07/16 0327) Pulse Rate: 65 (07/16 0327)  Labs:  Recent Labs  05/30/17 0545 05/30/17 0900 05/31/17 0350 05/31/17 1943 06/01/17 0418  HGB 10.9*  --   --   --   --   HCT 32.8*  --   --   --   --   PLT 182  --   --   --   --   LABPROT  --  17.5* 18.1*  --  22.6*  INR  --  1.42 1.48  --  1.96  HEPARINUNFRC  --   --   --  0.23* 0.46  CREATININE 2.37*  --  2.22*  --  2.17*    Estimated Creatinine Clearance: 37.2 mL/min (A) (by C-G formula based on SCr of 2.17 mg/dL (H)).  Assessment:  56 y/o M who presented with nausea, vomitting, and abdominal pain. Pharmacy consulted to dose coumadin that he is on chronically secondary to Afib. His PTA dose is 2.5 mg daily, but he endorses non-compliance and his admit INR was subtherapeutic at 1.42. His last dose PTA was on 07/12.   INR trend since 07/14:  1.42 (5mg  x1)>>1.48>> 1.96  Heparin level therapeutic: 0.46 CBC stable, no overt s/s bleeding noted.   Goal of Therapy:  INR 2-3 Heparin level 0.3-0.7 units/ml Monitor platelets by anticoagulation protocol: Yes   Plan:  Continue heparin gtt at 1350 units/hr  Confirmatory heparin level in 6 hrs Daily Heparin level, INR and CBC Monitor for S/sx of bleeding  Argie Ramming, PharmD Clinical Pharmacist 06/01/17 5:42 AM

## 2017-06-02 ENCOUNTER — Encounter: Payer: Self-pay | Admitting: *Deleted

## 2017-06-02 ENCOUNTER — Ambulatory Visit: Payer: Medicare Other | Admitting: Gastroenterology

## 2017-06-02 DIAGNOSIS — I5021 Acute systolic (congestive) heart failure: Secondary | ICD-10-CM

## 2017-06-02 DIAGNOSIS — I420 Dilated cardiomyopathy: Secondary | ICD-10-CM

## 2017-06-02 LAB — HEMOGLOBIN A1C
Hgb A1c MFr Bld: 5.5 % (ref 4.8–5.6)
MEAN PLASMA GLUCOSE: 111 mg/dL

## 2017-06-02 LAB — BASIC METABOLIC PANEL
ANION GAP: 7 (ref 5–15)
BUN: 37 mg/dL — ABNORMAL HIGH (ref 6–20)
CALCIUM: 8.5 mg/dL — AB (ref 8.9–10.3)
CHLORIDE: 102 mmol/L (ref 101–111)
CO2: 24 mmol/L (ref 22–32)
Creatinine, Ser: 2.17 mg/dL — ABNORMAL HIGH (ref 0.61–1.24)
GFR calc non Af Amer: 32 mL/min — ABNORMAL LOW (ref >60)
GFR, EST AFRICAN AMERICAN: 37 mL/min — AB (ref >60)
Glucose, Bld: 124 mg/dL — ABNORMAL HIGH (ref 65–99)
Potassium: 3.8 mmol/L (ref 3.5–5.1)
Sodium: 133 mmol/L — ABNORMAL LOW (ref 135–145)

## 2017-06-02 LAB — HEPARIN LEVEL (UNFRACTIONATED): Heparin Unfractionated: 0.66 IU/mL (ref 0.30–0.70)

## 2017-06-02 LAB — CBC
HEMATOCRIT: 27.8 % — AB (ref 39.0–52.0)
Hemoglobin: 9.4 g/dL — ABNORMAL LOW (ref 13.0–17.0)
MCH: 25.1 pg — ABNORMAL LOW (ref 26.0–34.0)
MCHC: 33.8 g/dL (ref 30.0–36.0)
MCV: 74.3 fL — AB (ref 78.0–100.0)
Platelets: 132 10*3/uL — ABNORMAL LOW (ref 150–400)
RBC: 3.74 MIL/uL — AB (ref 4.22–5.81)
RDW: 19.2 % — ABNORMAL HIGH (ref 11.5–15.5)
WBC: 3.2 10*3/uL — ABNORMAL LOW (ref 4.0–10.5)

## 2017-06-02 LAB — PROTIME-INR
INR: 2.87
Prothrombin Time: 30.7 seconds — ABNORMAL HIGH (ref 11.4–15.2)

## 2017-06-02 LAB — GLUCOSE, CAPILLARY
GLUCOSE-CAPILLARY: 117 mg/dL — AB (ref 65–99)
Glucose-Capillary: 143 mg/dL — ABNORMAL HIGH (ref 65–99)

## 2017-06-02 MED ORDER — COLCHICINE 0.6 MG PO TABS: 0.6000 mg | ORAL_TABLET | Freq: Every day | ORAL | 0 refills | Status: DC

## 2017-06-02 MED ORDER — WARFARIN SODIUM 2 MG PO TABS: 1.0000 mg | ORAL_TABLET | Freq: Once | ORAL | Status: DC

## 2017-06-02 MED ORDER — CARVEDILOL 25 MG PO TABS: 25.0000 mg | ORAL_TABLET | Freq: Two times a day (BID) | ORAL | 0 refills | Status: DC

## 2017-06-02 MED FILL — ?CARVEDILOL 25 MG TABLET: 25 | 30 days supply | Qty: 60 | Fill #0

## 2017-06-02 MED FILL — COLCHICINE 0.6 MG TABLET: 0.6 | 7 days supply | Qty: 7 | Fill #0

## 2017-06-02 NOTE — Discharge Instructions (Signed)
Cirrhosis Cirrhosis is long-term (chronic) liver injury. The liver is your largest internal organ, and it performs many functions. The liver converts food into energy, removes toxic material from your blood, makes important proteins, and absorbs necessary vitamins from your diet. If you have cirrhosis, it means many of your healthy liver cells have been replaced by scar tissue. This prevents blood from flowing through your liver, which makes it difficult for your liver to function. This scarring is not reversible, but treatment can prevent it from getting worse. What are the causes? Hepatitis C and long-term alcohol abuse are the most common causes of cirrhosis. Other causes include:  Nonalcoholic fatty liver disease.  Hepatitis B infection.  Autoimmune hepatitis.  Diseases that cause blockage of ducts inside the liver.  Inherited liver diseases.  Reactions to certain long-term medicines.  Parasitic infections.  Long-term exposure to certain toxins.  What increases the risk? You may have a higher risk of cirrhosis if you:  Have certain hepatitis viruses.  Abuse alcohol, especially if you are male.  Are overweight.  Share needles.  Have unprotected sex with someone who has hepatitis.  What are the signs or symptoms? You may not have any signs and symptoms at first. Symptoms may not develop until the damage to your liver starts to get worse. Signs and symptoms of cirrhosis may include:  Tenderness in the right-upper part of your abdomen.  Weakness and tiredness (fatigue).  Loss of appetite.  Nausea.  Weight loss and muscle loss.  Itchiness.  Yellow skin and eyes (jaundice).  Buildup of fluid in the abdomen (ascites).  Swelling of the feet and ankles (edema).  Appearance of tiny blood vessels under the skin.  Mental confusion.  Easy bruising and bleeding.  How is this diagnosed? Your health care provider may suspect cirrhosis based on your symptoms and  medical history, especially if you have other medical conditions or a history of alcohol abuse. Your health care provider will do a physical exam to feel your liver and check for signs of cirrhosis. Your health care provider may perform other tests, including:  Blood tests to check: ? Whether you have hepatitis B or C. ? Kidney function. ? Liver function.  Imaging tests such as: ? MRI or CT scan to look for changes seen in advanced cirrhosis. ? Ultrasound to see if normal liver tissue is being replaced by scar tissue.  A procedure using a long needle to take a sample of liver tissue (biopsy) for examination under a microscope. Liver biopsy can confirm the diagnosis of cirrhosis.  How is this treated? Treatment depends on how damaged your liver is and what caused the damage. Treatment may include treating cirrhosis symptoms or treating the underlying causes of the condition to try to slow the progression of the damage. Treatment may include:  Making lifestyle changes, such as: ? Eating a healthy diet. ? Restricting salt intake. ? Maintaining a healthy weight. ? Not abusing drugs or alcohol.  Taking medicines to: ? Treat liver infections or other infections. ? Control itching. ? Reduce fluid buildup. ? Reduce certain blood toxins. ? Reduce risk of bleeding from enlarged blood vessels in the stomach or esophagus (varices).  If varices are causing bleeding problems, you may need treatment with a procedure that ties up the vessels causing them to fall off (band ligation).  If cirrhosis is causing your liver to fail, your health care provider may recommend a liver transplant.  Other treatments may be recommended depending on any complications  of cirrhosis, such as liver-related kidney failure (hepatorenal syndrome).  Follow these instructions at home:  Take medicines only as directed by your health care provider. Do not use drugs that are toxic to your liver. Ask your health care  provider before taking any new medicines, including over-the-counter medicines.  Rest as needed.  Eat a well-balanced diet. Ask your health care provider or dietitian for more information.  You may have to follow a low-salt diet or restrict your water intake as directed.  Do not drink alcohol. This is especially important if you are taking acetaminophen.  Keep all follow-up visits as directed by your health care provider. This is important. Contact a health care provider if:  You have fatigue or weakness that is getting worse.  You develop swelling of the hands, feet, legs, or face.  You have a fever.  You develop loss of appetite.  You have nausea or vomiting.  You develop jaundice.  You develop easy bruising or bleeding. Get help right away if:  You vomit bright red blood or a material that looks like coffee grounds.  You have blood in your stools.  Your stools appear black and tarry.  You become confused.  You have chest pain or trouble breathing. This information is not intended to replace advice given to you by your health care provider. Make sure you discuss any questions you have with your health care provider. Document Released: 11/03/2005 Document Revised: 03/13/2016 Document Reviewed: 07/12/2014 Elsevier Interactive Patient Education  2018 [place]. Heart Failure Heart failure means your heart has trouble pumping blood. This makes it hard for your body to work well. Heart failure is usually a long-term (chronic) condition. You must take good care of yourself and follow your doctor's treatment plan. Follow these instructions at home:  Take your heart medicine as told by your doctor. ? Do not stop taking medicine unless your doctor tells you to. ? Do not skip any dose of medicine. ? Refill your medicines before they run out. ? Take other medicines only as told by your doctor or pharmacist.  Stay active if told by your doctor. The elderly and people with  severe heart failure should talk with a doctor about physical activity.  Eat heart-healthy foods. Choose foods that are without trans fat and are low in saturated fat, cholesterol, and salt (sodium). This includes fresh or frozen fruits and vegetables, fish, lean meats, fat-free or low-fat dairy foods, whole grains, and high-fiber foods. Lentils and dried peas and beans (legumes) are also good choices.  Limit salt if told by your doctor.  Cook in a healthy way. Roast, grill, broil, bake, poach, steam, or stir-fry foods.  Limit fluids as told by your doctor.  Weigh yourself every morning. Do this after you pee (urinate) and before you eat breakfast. Write down your weight to give to your doctor.  Take your blood pressure and write it down if your doctor tells you to.  Ask your doctor how to check your pulse. Check your pulse as told.  Lose weight if told by your doctor.  Stop smoking or chewing tobacco. Do not use gum or patches that help you quit without your doctor's approval.  Schedule and go to doctor visits as told.  Nonpregnant women should have no more than 1 drink a day. Men should have no more than 2 drinks a day. Talk to your doctor about drinking alcohol.  Stop illegal drug use.  Stay current with shots (immunizations).  Manage your  health conditions as told by your doctor.  Learn to manage your stress.  Rest when you are tired.  If it is really hot outside: ? Avoid intense activities. ? Use air conditioning or fans, or get in a cooler place. ? Avoid caffeine and alcohol. ? Wear loose-fitting, lightweight, and light-colored clothing.  If it is really cold outside: ? Avoid intense activities. ? Layer your clothing. ? Wear mittens or gloves, a hat, and a scarf when going outside. ? Avoid alcohol.  Learn about heart failure and get support as needed.  Get help to maintain or improve your quality of life and your ability to care for yourself as needed. Contact a  doctor if:  You gain weight quickly.  You are more short of breath than usual.  You cannot do your normal activities.  You tire easily.  You cough more than normal, especially with activity.  You have any or more puffiness (swelling) in areas such as your hands, feet, ankles, or belly (abdomen).  You cannot sleep because it is hard to breathe.  You feel like your heart is beating fast (palpitations).  You get dizzy or light-headed when you stand up. Get help right away if:  You have trouble breathing.  There is a change in mental status, such as becoming less alert or not being able to focus.  You have chest pain or discomfort.  You faint. This information is not intended to replace advice given to you by your health care provider. Make sure you discuss any questions you have with your health care provider. Document Released: 08/12/2008 Document Revised: 04/10/2016 Document Reviewed: 12/20/2012 Elsevier Interactive Patient Education  2017 [employer].

## 2017-06-02 NOTE — Discharge Summary (Addendum)
Physician Discharge Summary  Michael [name] PYK:[id number] DOB: [birth year] DOA: 05/30/2017  PCP: [name], MD  Admit date: 05/30/2017 Discharge date: 06/02/2017  Time spent: 45 minutes  Recommendations for Outpatient Follow-up:  Patient will be discharged to home with home health physical therapy.  Patient will need to follow up with primary care provider within one week of discharge, repeat BMP and check INR.  Hold your coumadin dose tonight, and restart on 06/03/17. Follow up with Dr. Silverio Decamp, gastroenterologist. Follow up with Dr. Harrington Challenger, cardiologist as needed. Patient should continue medications as prescribed.  Patient should follow a renal/carb modified diet.   Discharge Diagnoses:  Acute on chronic Systolic heart failure Anasarca  Abdominal pain with nausea/vomiting Chronic kidney disease, stage IV Essential hypertension Hyperlipidemia Chronic atrial fibrillation with subtherapeutic INR Anemia of chronic disease Diabetes mellitus, type II GERD History of liver cirrhosis Left 5th digit pain/edema/ Possible gout flare Deconditioning  Discharge Condition: Stable  Diet recommendation: renal/carb modified  Filed Weights   05/31/17 0526 06/01/17 0500 06/02/17 0532  Weight: 82 kg (180 lb 12.8 oz) 83.1 kg (183 lb 4.8 oz) 83.9 kg (185 lb)    History of present illness:  on 05/30/2017 by Ms. Michael Butters, PA [name] a 56 y.o.malewith medical history significant for HTN, HLD, CKD 4, COPD, CAD, PAF, pulmonary HTN, OSA , DM, Chronic systolic CHF and liver cirrhosis presenting to the ED with increased bloating, nausea and vomiting, small amount of loose stools. Denies radiation of the pain . Not aggravated by taking in deep breaths. No alleviating factors. Able to pass gas. Denies blood in the stools. Denies shortness of breath, cough or chest pain or palpitations .No recent trips or food poisoning. No changes in medication or over the counter products; he  admits to medicine non compliance and food indiscretions. Denies a history of hepatitis. He reports bilateral lower extremity swelling. He is very fatigued. He does notsmoke. No ETOH or recreational drug use. No prior abdominal surgeries. He has been admitted in the past around 02/2016 with similar symptoms   Hospital Course:  Acute on chronic Systolic heart failure -Upon admission, BNP 1404.9, chest x-ray showed vascular congestion -Patient takes oral Lasix 80 mg twice daily at home  -Echocardiogram on 04/08/2017 showed an EF of 35-40% -Repeat Echo 06/01/17: EF 50-55%, no RWMA, increased PA pressure 75mmHg -Patient was placed on Lasix 40 mg IV twice a day during hospitalization -Monitor intake, output, daily weights -Urine output over the past 24 hours 2840cc -Metoprolol discontinued and patient placed on coreg -Not on ACE inhibitor or ARB secondary to chronic kidney disease -Continue home dose of Lasix upon discharge and follow-up closely with cardiology  Anasarca  -in the setting of liver cirrhosis and CHF exacerbation with kidney disease and hypoalbuminemia -CT abdomen and pelvis showed anasarca pattern with bilateral pleural effusions, ascites, edema -Patient was started on ceftriaxone for possible SBP (although patient currently afebrile or leukocytosis) -Paracentesis not done as patient had only minimal peritoneal fluid with numerous mobile loops of bowel. There was no adequate pocket of fluid to safely proceed with paracentesis -Continue lasix, home dose at discharge -Patient should follow up with gastroenterology  Abdominal pain with nausea/vomiting -Seems to have resolved -Likely secondary to the above -Continue antiemetics as needed  Chronic kidney disease, stage IV -Baseline creatinine approximately 2.2-2.3 -Currently creatinine 2.17 -Continue to check BMP periodically as an outpatient  Essential hypertension -Changed metoprolol to coreg -Continue  lasix  Hyperlipidemia -Continue statin   Chronic atrial fibrillation with  subtherapeutic INR -CHADSVASC 3 (HTN, DM, CHF) -Continue coumadin and heparin bridge per pharmcy -INR now 2.87 -Should continue Coumadin and have INR rechecked in 2 days.  Anemia of chronic disease -Hemoglobin currently 9.4, baseline hemoglobin between 9-10  Diabetes mellitus, type II -Continue home regimen upon discharge  -Last hemoglobin A1c 6 (01/16/2017)  GERD -Continue PPI  History of liver cirrhosis -AST and ALT appear to be within normal limits, however total bili remained mildly elevated but trending downward -Patient will need follow-up with gastroenterology as an outpatient- Dr. Silverio Decamp or Ms. Lemmons, PA  Left 5th digit pain/edema/Possible gout flare -possibly secondary to Gout flare (though this usually happens in patient's foot) -patient is was IV lasix which may exacerbate gout -Denied trauma or injury to this hand -Obtained xray: Irregularity of the dorsal cortex of the distal phalanx of the left fifth digit, may reflect nondisplaced fracture or partially healed fracture -Improved with one dose of colchicine (1.2mg ), will continue 0.6mg  daily for one week. Advised patient to follow up with PCP.  -Uric acid 12.2 (high)  Deconditioning -likely due to comorbidities -PT consulted, recommended home health and rolling walker   Procedures: Abdominal US Echocardiogram  Consultations: Interventional radiology  Discharge Exam: Vitals:   06/02/17 0532 06/02/17 1213  BP: 118/88 (!) 117/98  Pulse: 100 94  Resp: 18 20  Temp: 98 F (36.7 C) 98.2 F (36.8 C)   Feels finger pain has improved. Feels abdominal pain and nausea have resolved. Denies shortness of breath, chest pain, nausea or vomiting, diarrhea or constipation, dizziness or headache.   General: Well developed, well nourished, NAD, appears stated age  HEENT: NCAT, mucous membranes moist.  Cardiovascular: S1 S2  auscultated, 2/6SEM, irregular  Respiratory: Clear to auscultation bilaterally with equal chest rise  Abdomen: Soft, nontender, nondistended, + bowel sounds  Extremities: warm dry without cyanosis clubbing. +LE edema, improving. Left 5th digit TTP-improved, mild erythema.  Neuro: AAOx3, nonfocal  Psych: Appropriate  Discharge Instructions Discharge Instructions    Discharge instructions    Complete by:  As directed    1.You will go home with home health physical therapy 2. Follow-up with your primary care physician in one week, recheck your BMP 3. Recheck INR in 2-3 days 4. Hold Coumadin tonight, restart on 06/03/2017 5. Follow-up with Dr. Silverio Decamp, gastroenterologist, in 1-2 weeks 6. Follow-up with Dr. Harrington Challenger, cardiologist as needed 7. Continue your medications as prescribed 8. Follow a carb modified/renal healthy diet     Current Discharge Medication List    START taking these medications   Details  carvedilol (COREG) 25 MG tablet Take 1 tablet (25 mg total) by mouth 2 (two) times daily with a meal. Qty: 60 tablet, Refills: 0      CONTINUE these medications which have CHANGED   Details  colchicine (COLCRYS) 0.6 MG tablet Take 1 tablet (0.6 mg total) by mouth daily. Qty: 7 tablet, Refills: 0   Associated Diagnoses: Acute idiopathic gout of ankle, unspecified laterality      CONTINUE these medications which have NOT CHANGED   Details  acetaminophen (TYLENOL) 500 MG tablet Take 1 tablet (500 mg total) by mouth every 6 (six) hours as needed. Qty: 30 tablet, Refills: 0    atorvastatin (LIPITOR) 20 MG tablet Take 1 tablet (20 mg total) by mouth daily. Qty: 30 tablet, Refills: 5   Associated Diagnoses: Pure hypercholesterolemia    folic acid (FOLVITE) 1 MG tablet TAKE 1 TABLET BY MOUTH DAILY. Qty: 30 tablet, Refills: 2  furosemide (LASIX) 80 MG tablet Take 1 tablet (80 mg total) by mouth 2 (two) times daily. Qty: 60 tablet, Refills: 3   Associated Diagnoses: Chronic  systolic CHF (congestive heart failure) (HCC)    Insulin Detemir (LEVEMIR FLEXTOUCH) 100 UNIT/ML Pen Inject 10 Units into the skin daily at 10 pm. Qty: 15 mL, Refills: 11   Associated Diagnoses: Type 2 diabetes mellitus with complication, with long-term current use of insulin (HCC)    omeprazole (PRILOSEC) 20 MG capsule Take 1 capsule (20 mg total) by mouth daily. Qty: 30 capsule, Refills: 5   Associated Diagnoses: Gastroesophageal reflux disease without esophagitis    traMADol (ULTRAM) 50 MG tablet Take 1 tablet (50 mg total) by mouth every 12 (twelve) hours as needed. Qty: 60 tablet, Refills: 2   Associated Diagnoses: Acute pain of right knee    warfarin (COUMADIN) 2.5 MG tablet Take coumadin as directed by coumadin clinic Qty: 45 tablet, Refills: 1      STOP taking these medications     metoprolol (LOPRESSOR) 100 MG tablet        No Known Allergies Follow-up Information    [name], MD. Schedule an appointment as soon as possible for a visit in 1 week(s).   Specialty:  Family Medicine Why:  Hospital follow-up Contact information: [place] [address] [phone]        Mauri Pole, MD. Schedule an appointment as soon as possible for a visit in 1 week(s).   Specialty:  Gastroenterology Why:  Hospital follow-up, liver cirrhosis Contact information: [place] [address] [phone]        Fay Records, MD. Call.   Specialty:  Cardiology Why:  As needed Contact information: [place] [address] [phone]            The results of significant diagnostics from this hospitalization (including imaging, microbiology, ancillary and laboratory) are listed below for reference.    Significant Diagnostic Studies: Ct Abdomen Pelvis Wo Contrast  Result Date: 05/30/2017 CLINICAL DATA:  Nausea, vomiting, diarrhea, abdominal pain EXAM: CT ABDOMEN AND PELVIS WITHOUT CONTRAST  TECHNIQUE: Multidetector CT imaging of the abdomen and pelvis was performed following the standard protocol without IV contrast. COMPARISON:  02/25/2016 FINDINGS: Lower chest: Cardiomegaly. Small to moderate bilateral pleural effusions, right greater than left. Bibasilar atelectasis. Hepatobiliary: No focal hepatic abnormality. Gallbladder unremarkable. Pancreas: No focal abnormality or ductal dilatation. Spleen: No focal abnormality.  Normal size. Adrenals/Urinary Tract: Large early staghorn calculus in the left kidney, the largest area measuring up to 2 cm in the left renal pelvis. No hydronephrosis. No ureteral stones. Adrenal glands and urinary bladder unremarkable. Stomach/Bowel: Stomach, large and small bowel grossly unremarkable. Vascular/Lymphatic: Aortic and iliac calcifications. No aneurysm or adenopathy. Reproductive: No visible focal abnormality. Other: Moderate perihepatic and perisplenic free fluid. Small amount of free fluid in the cul-de-sac. There is diffuse edema throughout the mesentery and subcutaneous soft tissues. Musculoskeletal: No acute bony abnormality. IMPRESSION: Anasarca pattern with bilateral pleural effusions, ascites, and edema throughout the mesentery and subcutaneous soft tissues. Cardiomegaly. Left renal stones.  No hydronephrosis. No evidence of bowel obstruction. Electronically Signed   By: Rolm Baptise M.D.   On: 05/30/2017 11:01   Dg Chest 2 View  Result Date: 05/31/2017 CLINICAL DATA:  Nausea and vomiting.  Abdominal pain. EXAM: CHEST  2 VIEW COMPARISON:  05/30/2017. FINDINGS: Markedly enlarged cardiomediastinal silhouette. Bibasilar subsegmental atelectasis, and mild edema with small effusions,  overall improved from yesterday's radiograph. IMPRESSION: Improved aeration. Electronically Signed   By: Staci Righter M.D.   On: 05/31/2017 10:23   Dg Chest 2 View  Result Date: 05/30/2017 CLINICAL DATA:  Nausea, vomiting, diarrhea, fatigue and unable to sleep last night,  feels swollen, fever subjectively, history hypertension, CHF, asthma, diabetes mellitus, former smoker EXAM: CHEST  2 VIEW COMPARISON:  10/05/2016 FINDINGS: Enlargement of cardiac silhouette post MVR with mild pulmonary vascular congestion. Mediastinal contours normal. Atherosclerotic calcification aorta. Mild perihilar to basilar infiltrates, slightly greater on RIGHT than LEFT, favor asymmetric pulmonary edema. Suspect small pleural effusions bilaterally. No pneumothorax. Bones demineralized. IMPRESSION: Enlargement of cardiac silhouette with pulmonary vascular congestion. Probable mild pulmonary edema and bibasilar effusions. Aortic Atherosclerosis (ICD10-I70.0). Electronically Signed   By: Lavonia Dana M.D.   On: 05/30/2017 11:42   Dg Hand 2 View Left  Result Date: 06/01/2017 CLINICAL DATA:  56 year old male with a history left little finger pain EXAM: LEFT HAND - 2 VIEW COMPARISON:  None. FINDINGS: Lateral view of the fifth digit demonstrates irregularity of the cortex of the distal phalanx. No erosions. No radiopaque foreign body. No subluxation/ dislocation. Mild soft tissue swelling at the DIP of the fifth digit. IMPRESSION: Irregularity of the dorsal cortex of the distal phalanx of the left fifth digit, may reflect nondisplaced fracture or partially healed fracture. Correlation with a history of trauma may be useful. Electronically Signed   By: Corrie Mckusick D.O.   On: 06/01/2017 09:33   US Abdomen Limited  Result Date: 05/31/2017 CLINICAL DATA:  Abdominal ascites, cirrhosis, chronic kidney disease EXAM: LIMITED ABDOMEN ULTRASOUND FOR ASCITES TECHNIQUE: Limited ultrasound survey for ascites was performed in all four abdominal quadrants. COMPARISON:  05/30/2017 FINDINGS: Small amount of abdominal ascites predominately about the liver in the right upper quadrant. Hepatic cirrhosis evident. Volume of ascites is not enough to warrant therapeutic paracentesis. IMPRESSION: Small amount of abdominal  ascites, not enough to warrant therapeutic paracentesis. Hepatic cirrhosis Electronically Signed   By: Jerilynn Mages.  Shick M.D.   On: 05/31/2017 13:45    Microbiology: No results found for this or any previous visit (from the past 240 hour(s)).   Labs: Basic Metabolic Panel:  Recent Labs Lab 05/30/17 0545 05/31/17 0350 06/01/17 0418 06/02/17 0141  NA 137 136 136 133*  K 4.1 4.1 3.8 3.8  CL 106 108 104 102  CO2 23 22 24 24   GLUCOSE 110* 107* 119* 124*  BUN 42* 41* 41* 37*  CREATININE 2.37* 2.22* 2.17* 2.17*  CALCIUM 9.0 8.4* 8.6* 8.5*   Liver Function Tests:  Recent Labs Lab 05/30/17 0545 05/31/17 0350 05/31/17 1320 06/01/17 0418  AST 40 34  --  28  ALT 17 15*  --  13*  ALKPHOS 158* 117  --  108  BILITOT 3.6* 2.5*  --  2.2*  PROT 8.1 7.1 7.1 7.2  ALBUMIN 3.2* 2.7*  --  2.7*    Recent Labs Lab 05/30/17 0545  LIPASE 87*   No results for input(s): AMMONIA in the last 168 hours. CBC:  Recent Labs Lab 05/30/17 0545 06/01/17 0418 06/02/17 0141  WBC 6.7 3.8* 3.2*  HGB 10.9* 9.6* 9.4*  HCT 32.8* 28.4* 27.8*  MCV 75.6* 74.3* 74.3*  PLT 182 155 132*   Cardiac Enzymes: No results for input(s): CKTOTAL, CKMB, CKMBINDEX, TROPONINI in the last 168 hours. BNP: BNP (last 3 results)  Recent Labs  10/05/16 0147 05/30/17 1208  BNP 237.4* 1,404.9*    ProBNP (last 3 results)  Recent Labs  03/16/17 1401  PROBNP 6,530*    CBG:  Recent Labs Lab 06/01/17 1224 06/01/17 1650 06/01/17 2124 06/02/17 0736 06/02/17 1133  GLUCAP 143* 124* 177* 117* 143*       Signed:  Natalia Wittmeyer  [facility] 06/02/2017, 12:48 PM

## 2017-06-02 NOTE — Care Management Note (Signed)
Case Management Note  Patient Details  Name: Michael [name] MRN: [id number] Date of Birth: [birth year]  Subjective/Objective:   Admitted with CKD                Action/Plan: Patient stated that he lives at home with a friend; PCP: [name], MD; private insurance with Medicare; HHc choice offered, pt chose [facility]); Mary with [facility] called for arrangements. Patient stated that he has a rolling walker at home  Expected Discharge Date:  06/02/17               Expected Discharge Plan:  Organ  In-House Referral:   [facility]  Discharge planning Services  CM Consult  Choice offered to:  Patient  HH Arranged:  RN, Disease Management, PT [place] Agency:  [facility] ([facility])  Status of Service:  In process, will continue to follow  Louretta Parma [phone] 06/02/2017, 12:51 PM

## 2017-06-02 NOTE — Progress Notes (Signed)
Michael [name] for warfarin Indication: atrial fibrillation  No Known Allergies  Patient Measurements: Height: 5\' 5"  (165.1 cm) Weight: 185 lb (83.9 kg) (Scale C) IBW/kg (Calculated) : 61.5  Heparin Dosing Weight: 78.2 kg  Vital Signs: Temp: 98 F (36.7 C) (07/17 0532) Temp Source: Oral (07/17 0532) BP: 118/88 (07/17 0532) Pulse Rate: 100 (07/17 0532)   Recent Labs  05/31/17 0350  06/01/17 0418 06/01/17 0900 06/02/17 0141  HGB  --   --  9.6*  --  9.4*  HCT  --   --  28.4*  --  27.8*  PLT  --   --  155  --  132*  LABPROT 18.1*  --  22.6*  --  30.7*  INR 1.48  --  1.96  --  2.87  HEPARINUNFRC  --   < > 0.46 0.54 0.66  CREATININE 2.22*  --  2.17*  --  2.17*  < > = values in this interval not displayed.  Assessment:  56 y/o M who presented with nausea, vomitting, and abdominal pain. Pt endorses non-compliance as his admit INR was subtherapeutic at 1.42. Large jump in INR overnight, 1.9 > 2.8.   PTA warfarin regimen from most recent anticoag outpatient visit is: 2.5 mg on Sun/Tue/Thurs and 5 mg on all other days   Goal of Therapy:  INR 2-3 Heparin level 0.3-0.7 units/ml Monitor platelets by anticoagulation protocol: Yes   Plan:  Warfarin 1 mg po x1 Daily INR     Hughes Better, PharmD, BCPS Clinical Pharmacist 06/02/2017 7:54 AM

## 2017-06-04 ENCOUNTER — Telehealth: Payer: Self-pay | Admitting: Family Medicine

## 2017-06-04 NOTE — Telephone Encounter (Signed)
Noted  

## 2017-06-04 NOTE — Telephone Encounter (Signed)
Caller calling to advise that they will begin providing RN healthcare services tomorrow (06/05/17).

## 2017-06-05 ENCOUNTER — Telehealth: Payer: Self-pay | Admitting: Family Medicine

## 2017-06-05 DIAGNOSIS — K7460 Unspecified cirrhosis of liver: Secondary | ICD-10-CM | POA: Diagnosis not present

## 2017-06-05 DIAGNOSIS — I130 Hypertensive heart and chronic kidney disease with heart failure and stage 1 through stage 4 chronic kidney disease, or unspecified chronic kidney disease: Secondary | ICD-10-CM | POA: Diagnosis not present

## 2017-06-05 DIAGNOSIS — D631 Anemia in chronic kidney disease: Secondary | ICD-10-CM | POA: Diagnosis not present

## 2017-06-05 DIAGNOSIS — N184 Chronic kidney disease, stage 4 (severe): Secondary | ICD-10-CM | POA: Diagnosis not present

## 2017-06-05 DIAGNOSIS — I5023 Acute on chronic systolic (congestive) heart failure: Secondary | ICD-10-CM | POA: Diagnosis not present

## 2017-06-05 DIAGNOSIS — E1122 Type 2 diabetes mellitus with diabetic chronic kidney disease: Secondary | ICD-10-CM | POA: Diagnosis not present

## 2017-06-05 NOTE — Telephone Encounter (Signed)
Michael [name] from [facility] care called stating pt will start home care. Pt will receive cardiac education and education on diet. Any questions please f/up at [phone]

## 2017-06-08 ENCOUNTER — Other Ambulatory Visit: Payer: Self-pay

## 2017-06-08 ENCOUNTER — Ambulatory Visit: Payer: Medicare Other | Admitting: Family Medicine

## 2017-06-08 NOTE — Patient Outreach (Signed)
[facility]) Care Management  06/08/2017  Michael [name] Aug 12, 1961 [id number]  EMMI: Heart failure Referral date: 06/08/17 Referral source: EMMI heart failure red alert Referral reason: New worsening problem: YES Day # 3  Telephone call to patient using  pacific interpreters / [place] interpreter.  Unable to reach patient. [place] interpreter left patient HIPAA compliant voice message with call back phone number.   PLAN:  RNCM will attempt 2nd telephone outreach call to patient within 3 business days.   Quinn Plowman RN,BSN,CCM [facility] Telephonic  [phone]

## 2017-06-09 ENCOUNTER — Other Ambulatory Visit: Payer: Self-pay

## 2017-06-09 DIAGNOSIS — D631 Anemia in chronic kidney disease: Secondary | ICD-10-CM | POA: Diagnosis not present

## 2017-06-09 DIAGNOSIS — N184 Chronic kidney disease, stage 4 (severe): Secondary | ICD-10-CM | POA: Diagnosis not present

## 2017-06-09 DIAGNOSIS — I130 Hypertensive heart and chronic kidney disease with heart failure and stage 1 through stage 4 chronic kidney disease, or unspecified chronic kidney disease: Secondary | ICD-10-CM | POA: Diagnosis not present

## 2017-06-09 DIAGNOSIS — K7460 Unspecified cirrhosis of liver: Secondary | ICD-10-CM | POA: Diagnosis not present

## 2017-06-09 DIAGNOSIS — E1122 Type 2 diabetes mellitus with diabetic chronic kidney disease: Secondary | ICD-10-CM | POA: Diagnosis not present

## 2017-06-09 DIAGNOSIS — I5023 Acute on chronic systolic (congestive) heart failure: Secondary | ICD-10-CM | POA: Diagnosis not present

## 2017-06-09 NOTE — Patient Outreach (Signed)
[facility]) Care Management  06/09/2017  Michael [name] 03/09/1961 [id number]  EMMI: Heart failure Referral date: 06/08/17 Referral source: EMMI heart failure red alert Referral reason: New worsening problem: YES Day # 3 Attempt #2  Second telephone call to patient using  pacific interpreters / [place] interpreter.  Unable to reach patient. [place] interpreter left patient HIPAA compliant voice message with call back phone number.   PLAN:  RNCM will attempt 3rd telephone outreach call to patient within 3 business days.   Quinn Plowman RN,BSN,CCM [facility] Telephonic  [phone]

## 2017-06-09 NOTE — Telephone Encounter (Signed)
Medical Assistant left message on patient's home and cell voicemail. Voicemail states to give a call back to [place] with [facility] at [phone]. MA informed Michael [name] of patient having an appointment scheduled on 06/08/17 which the patient "NO SHOWED". MA did provide VO for social work being added to his home health needs.

## 2017-06-09 NOTE — Telephone Encounter (Signed)
Michael [name] from [place] at [employer] requesting an order to add a Education officer, museum for the pt.  Rep also stated that pt. Was having bloody stool on Sunday and would like to know when his  Next INR is. Please f/u

## 2017-06-10 ENCOUNTER — Ambulatory Visit: Payer: Self-pay

## 2017-06-10 ENCOUNTER — Ambulatory Visit (INDEPENDENT_AMBULATORY_CARE_PROVIDER_SITE_OTHER): Payer: Self-pay | Admitting: Internal Medicine

## 2017-06-10 ENCOUNTER — Telehealth: Payer: Self-pay | Admitting: Family Medicine

## 2017-06-10 ENCOUNTER — Telehealth: Payer: Self-pay | Admitting: Internal Medicine

## 2017-06-10 DIAGNOSIS — I079 Rheumatic tricuspid valve disease, unspecified: Secondary | ICD-10-CM

## 2017-06-10 DIAGNOSIS — Z5181 Encounter for therapeutic drug level monitoring: Secondary | ICD-10-CM

## 2017-06-10 DIAGNOSIS — K7460 Unspecified cirrhosis of liver: Secondary | ICD-10-CM | POA: Diagnosis not present

## 2017-06-10 DIAGNOSIS — D631 Anemia in chronic kidney disease: Secondary | ICD-10-CM | POA: Diagnosis not present

## 2017-06-10 DIAGNOSIS — E1122 Type 2 diabetes mellitus with diabetic chronic kidney disease: Secondary | ICD-10-CM | POA: Diagnosis not present

## 2017-06-10 DIAGNOSIS — I5023 Acute on chronic systolic (congestive) heart failure: Secondary | ICD-10-CM | POA: Diagnosis not present

## 2017-06-10 DIAGNOSIS — I130 Hypertensive heart and chronic kidney disease with heart failure and stage 1 through stage 4 chronic kidney disease, or unspecified chronic kidney disease: Secondary | ICD-10-CM | POA: Diagnosis not present

## 2017-06-10 DIAGNOSIS — N184 Chronic kidney disease, stage 4 (severe): Secondary | ICD-10-CM | POA: Diagnosis not present

## 2017-06-10 DIAGNOSIS — I059 Rheumatic mitral valve disease, unspecified: Secondary | ICD-10-CM

## 2017-06-10 LAB — POCT INR: INR: 2.4

## 2017-06-10 MED ORDER — WARFARIN SODIUM 2.5 MG PO TABS: ORAL_TABLET | ORAL | 1 refills | Status: DC

## 2017-06-10 NOTE — Telephone Encounter (Signed)
Lattie Haw with [facility], states that patient reported bloody stool and Lattie Haw would like to know if she could check INR tomorrow. Please call and if you cant reach Lattie Haw, she asks that you leave a detailed message on phone.

## 2017-06-10 NOTE — Telephone Encounter (Signed)
See anticoag encounter from 06/10/17 for dosing.

## 2017-06-10 NOTE — Telephone Encounter (Signed)
Received call from front desk, Home Health on the line and they wanted approval for home INR. We do not manage the patient's coumadin, [facility] does, so recommended that they follow up with the cardiologist.

## 2017-06-10 NOTE — Telephone Encounter (Signed)
Will await results to be called to the Anticoagulation Clinic. Thanks.

## 2017-06-10 NOTE — Telephone Encounter (Signed)
Spoke with Lattie Haw.  She is trying to go to patient's home today to draw INR.  Order provided to draw.  Patient was unable to tell her what dose he was taking or which bottle was Coumadin when she was there previously.   She has his pill box filled with 2.5 mg tablets.    He reported blood in his stool to her today.  She will call results to coumadin clinic.  I will route this message to coumadin clinic.

## 2017-06-10 NOTE — Telephone Encounter (Signed)
error 

## 2017-06-11 ENCOUNTER — Other Ambulatory Visit: Payer: Self-pay

## 2017-06-11 DIAGNOSIS — I130 Hypertensive heart and chronic kidney disease with heart failure and stage 1 through stage 4 chronic kidney disease, or unspecified chronic kidney disease: Secondary | ICD-10-CM | POA: Diagnosis not present

## 2017-06-11 DIAGNOSIS — D631 Anemia in chronic kidney disease: Secondary | ICD-10-CM | POA: Diagnosis not present

## 2017-06-11 DIAGNOSIS — I5023 Acute on chronic systolic (congestive) heart failure: Secondary | ICD-10-CM | POA: Diagnosis not present

## 2017-06-11 DIAGNOSIS — E1122 Type 2 diabetes mellitus with diabetic chronic kidney disease: Secondary | ICD-10-CM | POA: Diagnosis not present

## 2017-06-11 DIAGNOSIS — N184 Chronic kidney disease, stage 4 (severe): Secondary | ICD-10-CM | POA: Diagnosis not present

## 2017-06-11 DIAGNOSIS — K7460 Unspecified cirrhosis of liver: Secondary | ICD-10-CM | POA: Diagnosis not present

## 2017-06-11 NOTE — Patient Outreach (Signed)
[facility]) Care Management  06/11/2017  Michael [name] August 19, 1961 [id number]  EMMI: Heart failure Referral date: 06/08/17 Referral source: EMMI heart failure red alert Referral reason: New worsening problem: YES Day # 3 Attempt #3  Third  telephone call to patient regarding EMMI heart failure referral.  Unable to reach patient. HIPAA compliant voice message left with call back phone number.   PLAN: RNCM will send patient outreach letter to attempt  [place] RN,BSN,CCM [facility] Telephonic  [phone]

## 2017-06-12 ENCOUNTER — Other Ambulatory Visit: Payer: Self-pay

## 2017-06-12 ENCOUNTER — Other Ambulatory Visit: Payer: Self-pay | Admitting: Family Medicine

## 2017-06-12 DIAGNOSIS — M25561 Pain in right knee: Secondary | ICD-10-CM

## 2017-06-12 MED ORDER — TRAMADOL HCL 50 MG PO TABS: 50.0000 mg | ORAL_TABLET | Freq: Two times a day (BID) | ORAL | 2 refills | Status: DC | PRN

## 2017-06-12 NOTE — Patient Outreach (Signed)
[facility]) Care Management  06/12/2017  Michael [name] 03/25/1961 [id number]   EMMI: Heart failure Referral date: 06/08/17 Referral source: EMMI heart failure red alert Referral reason: New worsening problem: YES Day # 3  RNCM contacted patient. Patient states he is feeling ok.  Denies having any new symptoms. States he does not have transportation to his appointments. Patient difficult to understand. Patient very difficulty to understanding during conversation. RNCM informed patient she would call back with a [place] interpreter.   Telephone call to patient using pacific interpreters / laotian interpreter [phone]. Unable to reach patient. [place] interpreter left patient HIPAA compliant voice message with call back phone number.   PLAN: [facility] outreach letter sent to patient on 06/11/17 to attempt contact.   Quinn Plowman RN,BSN,CCM [facility] Telephonic  [phone]

## 2017-06-15 ENCOUNTER — Other Ambulatory Visit: Payer: Self-pay

## 2017-06-15 DIAGNOSIS — I130 Hypertensive heart and chronic kidney disease with heart failure and stage 1 through stage 4 chronic kidney disease, or unspecified chronic kidney disease: Secondary | ICD-10-CM | POA: Diagnosis not present

## 2017-06-15 DIAGNOSIS — I5023 Acute on chronic systolic (congestive) heart failure: Secondary | ICD-10-CM | POA: Diagnosis not present

## 2017-06-15 DIAGNOSIS — N184 Chronic kidney disease, stage 4 (severe): Secondary | ICD-10-CM | POA: Diagnosis not present

## 2017-06-15 DIAGNOSIS — E1122 Type 2 diabetes mellitus with diabetic chronic kidney disease: Secondary | ICD-10-CM | POA: Diagnosis not present

## 2017-06-15 DIAGNOSIS — D631 Anemia in chronic kidney disease: Secondary | ICD-10-CM | POA: Diagnosis not present

## 2017-06-15 DIAGNOSIS — K7460 Unspecified cirrhosis of liver: Secondary | ICD-10-CM | POA: Diagnosis not present

## 2017-06-16 DIAGNOSIS — K7460 Unspecified cirrhosis of liver: Secondary | ICD-10-CM | POA: Diagnosis not present

## 2017-06-16 DIAGNOSIS — D631 Anemia in chronic kidney disease: Secondary | ICD-10-CM | POA: Diagnosis not present

## 2017-06-16 DIAGNOSIS — N184 Chronic kidney disease, stage 4 (severe): Secondary | ICD-10-CM | POA: Diagnosis not present

## 2017-06-16 DIAGNOSIS — E1122 Type 2 diabetes mellitus with diabetic chronic kidney disease: Secondary | ICD-10-CM | POA: Diagnosis not present

## 2017-06-16 DIAGNOSIS — I5023 Acute on chronic systolic (congestive) heart failure: Secondary | ICD-10-CM | POA: Diagnosis not present

## 2017-06-16 DIAGNOSIS — I130 Hypertensive heart and chronic kidney disease with heart failure and stage 1 through stage 4 chronic kidney disease, or unspecified chronic kidney disease: Secondary | ICD-10-CM | POA: Diagnosis not present

## 2017-06-16 NOTE — Patient Outreach (Signed)
[facility]) Care Management  06/16/2017  Jakobi Reicher 02/25/1961 [id number]   EMMI: Heart failure Referral date: 06/08/17 Referral source: EMMI heart failure red alert Referral reason: Know why to take medications: NO, Questions about meds: YES, Listened to these previous topics: Coping with a health condition.  Day # 9   Telephone call to patient regarding EMMI heart failure referral.  Unable to reach patient. HIPAA compliant voice message left with call back phone number.   PLAN: Outreach  Letter sent to patient on 06/11/17  Quinn Plowman RN,BSN,CCM [facility] Telephonic  [phone]

## 2017-06-18 ENCOUNTER — Ambulatory Visit: Payer: Medicare Other | Admitting: Gastroenterology

## 2017-06-18 DIAGNOSIS — I130 Hypertensive heart and chronic kidney disease with heart failure and stage 1 through stage 4 chronic kidney disease, or unspecified chronic kidney disease: Secondary | ICD-10-CM | POA: Diagnosis not present

## 2017-06-18 DIAGNOSIS — N184 Chronic kidney disease, stage 4 (severe): Secondary | ICD-10-CM | POA: Diagnosis not present

## 2017-06-18 DIAGNOSIS — D631 Anemia in chronic kidney disease: Secondary | ICD-10-CM | POA: Diagnosis not present

## 2017-06-18 DIAGNOSIS — I5023 Acute on chronic systolic (congestive) heart failure: Secondary | ICD-10-CM | POA: Diagnosis not present

## 2017-06-18 DIAGNOSIS — K7460 Unspecified cirrhosis of liver: Secondary | ICD-10-CM | POA: Diagnosis not present

## 2017-06-18 DIAGNOSIS — E1122 Type 2 diabetes mellitus with diabetic chronic kidney disease: Secondary | ICD-10-CM | POA: Diagnosis not present

## 2017-06-18 LAB — POCT INR: INR: 2.5

## 2017-06-19 ENCOUNTER — Ambulatory Visit (INDEPENDENT_AMBULATORY_CARE_PROVIDER_SITE_OTHER): Payer: Medicare Other | Admitting: Cardiovascular Disease

## 2017-06-19 DIAGNOSIS — I059 Rheumatic mitral valve disease, unspecified: Secondary | ICD-10-CM

## 2017-06-19 DIAGNOSIS — I079 Rheumatic tricuspid valve disease, unspecified: Secondary | ICD-10-CM

## 2017-06-19 DIAGNOSIS — Z5181 Encounter for therapeutic drug level monitoring: Secondary | ICD-10-CM

## 2017-06-19 NOTE — Telephone Encounter (Signed)
Will route to anticoag clinic.

## 2017-06-19 NOTE — Telephone Encounter (Signed)
Follow up  Nurse called to reports pts INR inr 2.5 pt 27.4 25 mg daily

## 2017-06-19 NOTE — Telephone Encounter (Signed)
Returned call to [employer] with [facility] & she states she tired calling main number on yesterday without success therefore, she called back today with an INR to main #, advise RN to call CVRR with results. Pt's INR was 2.5 and taking 2.5mg  daily. Please refer to Anticoagulation Encounter for dosing & recheck.

## 2017-06-22 ENCOUNTER — Telehealth: Payer: Self-pay | Admitting: Family Medicine

## 2017-06-22 DIAGNOSIS — I130 Hypertensive heart and chronic kidney disease with heart failure and stage 1 through stage 4 chronic kidney disease, or unspecified chronic kidney disease: Secondary | ICD-10-CM | POA: Diagnosis not present

## 2017-06-22 DIAGNOSIS — E1122 Type 2 diabetes mellitus with diabetic chronic kidney disease: Secondary | ICD-10-CM | POA: Diagnosis not present

## 2017-06-22 DIAGNOSIS — I5023 Acute on chronic systolic (congestive) heart failure: Secondary | ICD-10-CM | POA: Diagnosis not present

## 2017-06-22 DIAGNOSIS — K7460 Unspecified cirrhosis of liver: Secondary | ICD-10-CM | POA: Diagnosis not present

## 2017-06-22 DIAGNOSIS — N184 Chronic kidney disease, stage 4 (severe): Secondary | ICD-10-CM | POA: Diagnosis not present

## 2017-06-22 DIAGNOSIS — D631 Anemia in chronic kidney disease: Secondary | ICD-10-CM | POA: Diagnosis not present

## 2017-06-22 NOTE — Telephone Encounter (Signed)
Kellie from [facility] called to let pt. PCP know that he has gained 10 pounds since August 1st.  Pt. Does not complain of SOB but does have fatigue. Pt. Abdomen is also swollen. Rep states that pt. Had not taking his lasix until she arrived at pt. House around 12:30 p.m. Pt. Is on 80mg  of Lasix twice a day with no potassium. When Rep was at pt. House she states that he had no concentration, and looked high. Rep also stated that she saw a pipe on pt. Table and probable [facility] would be discharging him. Pt. Also needs to have his INR checked. Please f/u

## 2017-06-23 ENCOUNTER — Telehealth: Payer: Self-pay | Admitting: Family Medicine

## 2017-06-23 ENCOUNTER — Other Ambulatory Visit: Payer: Self-pay

## 2017-06-23 NOTE — Telephone Encounter (Signed)
Call placed to [facility] [phone] and spoke with Nathen May in regards to patient. Updated her with the information that Lambert Keto (nurse from [facility]) had left for provider regarding patient yesterday. Lamar Blinks stated that they had tried to reach patient with interpreter on several occasions but had no luck. If patient did want or was interested in [facility] services, a new referral would need to placed by provider.    Call placed to [facility] [phone] and I  spoke with Anderson Malta, she informed me that she would give my message to [facility] for a call back in regards to patient and patient status. Need to ask her what is the best way she contacts patient (when she comes out to visit him) and if he has a family member who can be contacted as well.

## 2017-06-23 NOTE — Telephone Encounter (Signed)
Please advise on this concern.

## 2017-06-23 NOTE — Telephone Encounter (Signed)
He will need to go to the ED if he is confused. Could you please inform [facility] nurse of this message from [place], looks like they tried to reach him today and failed? He has an appointment with me in 2 days. Thanks.

## 2017-06-23 NOTE — Patient Outreach (Signed)
[facility]) Care Management  06/23/2017  Cleven Defalco [birth year] [id number]  Care coordination EMMI: Heart failure Referral date: 06/08/17 Referral source: EMMI heart failure red alert Referral reason: Know why to take medications: NO, Questions about meds: YES, Listened to these previous topics: Coping with a health condition.  Day # 9  Received call from [place] from [employer] and wellness.  Ms. Venetia Night states patient is being seen by [facility].  States patient has not been compliant with care.  Ms. Venetia Night states patient has not been weighing daily or keeping scheduled appointments with [facility]. States patient is scheduled for primary MD follow up appointment on 06/25/17.  RNCM informed Venetia Night patient as been difficulty to establish contact with.  Request assistance with engaging patient to [facility] care management services.   PLAN: [facility] outreach letter sent to patient on 06/11/17.  If no response within 10 business days RNCM will close case.  Quinn Plowman RN,BSN,CCM [facility] Telephonic  [phone]

## 2017-06-23 NOTE — Patient Outreach (Signed)
[facility]) Care Management  06/23/2017  Michael [name] [birth year] [id number]   EMMI: Heart failure Referral date: 06/08/17 Referral source: EMMI heart failure red alert Referral reason: New worsening problem: YES, New swelling: YES, New shortness of breath: YES Day # 18   Telephone call to patient using pacific interpreters / laotian interpreter [phone]. Unable to reach patient. Laotian interpreter left patient HIPAA compliant voice message with call back phone number.   PLAN: Outreach letter sent to patient on  06/11/17.  If no response within 10 business days, RNCM will close patient to [place] RN,BSN,CCM [facility] Telephonic  [phone]

## 2017-06-25 ENCOUNTER — Inpatient Hospital Stay (HOSPITAL_COMMUNITY)
Admission: EM | Admit: 2017-06-25 | Discharge: 2017-07-27 | DRG: 264 | Disposition: A | Discharge status: Home Health | Payer: Medicare Other | Attending: Internal Medicine | Admitting: Internal Medicine

## 2017-06-25 ENCOUNTER — Ambulatory Visit (HOSPITAL_BASED_OUTPATIENT_CLINIC_OR_DEPARTMENT_OTHER): Payer: Medicare Other | Admitting: Family Medicine

## 2017-06-25 ENCOUNTER — Other Ambulatory Visit: Payer: Self-pay

## 2017-06-25 ENCOUNTER — Telehealth: Payer: Self-pay

## 2017-06-25 ENCOUNTER — Observation Stay (HOSPITAL_COMMUNITY): Payer: Medicare Other

## 2017-06-25 ENCOUNTER — Encounter (HOSPITAL_COMMUNITY): Payer: Self-pay

## 2017-06-25 ENCOUNTER — Encounter: Payer: Self-pay | Admitting: Family Medicine

## 2017-06-25 ENCOUNTER — Emergency Department (HOSPITAL_COMMUNITY): Payer: Medicare Other

## 2017-06-25 VITALS — BP 112/67 | HR 94 | Temp 97.4°F | Ht 63.5 in | Wt 201.8 lb

## 2017-06-25 DIAGNOSIS — D6832 Hemorrhagic disorder due to extrinsic circulating anticoagulants: Secondary | ICD-10-CM | POA: Diagnosis not present

## 2017-06-25 DIAGNOSIS — R040 Epistaxis: Secondary | ICD-10-CM | POA: Diagnosis present

## 2017-06-25 DIAGNOSIS — I10 Essential (primary) hypertension: Secondary | ICD-10-CM

## 2017-06-25 DIAGNOSIS — F17210 Nicotine dependence, cigarettes, uncomplicated: Secondary | ICD-10-CM | POA: Diagnosis present

## 2017-06-25 DIAGNOSIS — I132 Hypertensive heart and chronic kidney disease with heart failure and with stage 5 chronic kidney disease, or end stage renal disease: Principal | ICD-10-CM | POA: Diagnosis present

## 2017-06-25 DIAGNOSIS — T380X5A Adverse effect of glucocorticoids and synthetic analogues, initial encounter: Secondary | ICD-10-CM | POA: Diagnosis not present

## 2017-06-25 DIAGNOSIS — I481 Persistent atrial fibrillation: Secondary | ICD-10-CM | POA: Diagnosis present

## 2017-06-25 DIAGNOSIS — M25561 Pain in right knee: Secondary | ICD-10-CM

## 2017-06-25 DIAGNOSIS — M542 Cervicalgia: Secondary | ICD-10-CM | POA: Diagnosis not present

## 2017-06-25 DIAGNOSIS — I2510 Atherosclerotic heart disease of native coronary artery without angina pectoris: Secondary | ICD-10-CM | POA: Diagnosis present

## 2017-06-25 DIAGNOSIS — R601 Generalized edema: Secondary | ICD-10-CM | POA: Diagnosis present

## 2017-06-25 DIAGNOSIS — I480 Paroxysmal atrial fibrillation: Secondary | ICD-10-CM

## 2017-06-25 DIAGNOSIS — I5033 Acute on chronic diastolic (congestive) heart failure: Secondary | ICD-10-CM | POA: Diagnosis present

## 2017-06-25 DIAGNOSIS — I2729 Other secondary pulmonary hypertension: Secondary | ICD-10-CM | POA: Diagnosis present

## 2017-06-25 DIAGNOSIS — I482 Chronic atrial fibrillation, unspecified: Secondary | ICD-10-CM

## 2017-06-25 DIAGNOSIS — I5043 Acute on chronic combined systolic (congestive) and diastolic (congestive) heart failure: Secondary | ICD-10-CM

## 2017-06-25 DIAGNOSIS — E118 Type 2 diabetes mellitus with unspecified complications: Secondary | ICD-10-CM

## 2017-06-25 DIAGNOSIS — I953 Hypotension of hemodialysis: Secondary | ICD-10-CM | POA: Diagnosis not present

## 2017-06-25 DIAGNOSIS — M545 Low back pain: Secondary | ICD-10-CM | POA: Diagnosis not present

## 2017-06-25 DIAGNOSIS — E1122 Type 2 diabetes mellitus with diabetic chronic kidney disease: Secondary | ICD-10-CM | POA: Diagnosis not present

## 2017-06-25 DIAGNOSIS — I071 Rheumatic tricuspid insufficiency: Secondary | ICD-10-CM | POA: Diagnosis present

## 2017-06-25 DIAGNOSIS — D509 Iron deficiency anemia, unspecified: Secondary | ICD-10-CM | POA: Diagnosis present

## 2017-06-25 DIAGNOSIS — I5023 Acute on chronic systolic (congestive) heart failure: Secondary | ICD-10-CM | POA: Diagnosis not present

## 2017-06-25 DIAGNOSIS — Z95828 Presence of other vascular implants and grafts: Secondary | ICD-10-CM

## 2017-06-25 DIAGNOSIS — K7460 Unspecified cirrhosis of liver: Secondary | ICD-10-CM | POA: Diagnosis present

## 2017-06-25 DIAGNOSIS — E785 Hyperlipidemia, unspecified: Secondary | ICD-10-CM | POA: Diagnosis present

## 2017-06-25 DIAGNOSIS — N184 Chronic kidney disease, stage 4 (severe): Secondary | ICD-10-CM

## 2017-06-25 DIAGNOSIS — Z7189 Other specified counseling: Secondary | ICD-10-CM

## 2017-06-25 DIAGNOSIS — Z794 Long term (current) use of insulin: Secondary | ICD-10-CM | POA: Diagnosis not present

## 2017-06-25 DIAGNOSIS — Z452 Encounter for adjustment and management of vascular access device: Secondary | ICD-10-CM

## 2017-06-25 DIAGNOSIS — N179 Acute kidney failure, unspecified: Secondary | ICD-10-CM

## 2017-06-25 DIAGNOSIS — Z992 Dependence on renal dialysis: Secondary | ICD-10-CM

## 2017-06-25 DIAGNOSIS — E1129 Type 2 diabetes mellitus with other diabetic kidney complication: Secondary | ICD-10-CM

## 2017-06-25 DIAGNOSIS — I5022 Chronic systolic (congestive) heart failure: Secondary | ICD-10-CM

## 2017-06-25 DIAGNOSIS — K219 Gastro-esophageal reflux disease without esophagitis: Secondary | ICD-10-CM | POA: Diagnosis present

## 2017-06-25 DIAGNOSIS — E8809 Other disorders of plasma-protein metabolism, not elsewhere classified: Secondary | ICD-10-CM | POA: Diagnosis not present

## 2017-06-25 DIAGNOSIS — E861 Hypovolemia: Secondary | ICD-10-CM | POA: Diagnosis present

## 2017-06-25 DIAGNOSIS — N200 Calculus of kidney: Secondary | ICD-10-CM | POA: Diagnosis not present

## 2017-06-25 DIAGNOSIS — D696 Thrombocytopenia, unspecified: Secondary | ICD-10-CM | POA: Diagnosis not present

## 2017-06-25 DIAGNOSIS — Z953 Presence of xenogenic heart valve: Secondary | ICD-10-CM

## 2017-06-25 DIAGNOSIS — K5900 Constipation, unspecified: Secondary | ICD-10-CM | POA: Diagnosis not present

## 2017-06-25 DIAGNOSIS — I4510 Unspecified right bundle-branch block: Secondary | ICD-10-CM | POA: Diagnosis present

## 2017-06-25 DIAGNOSIS — K648 Other hemorrhoids: Secondary | ICD-10-CM | POA: Diagnosis present

## 2017-06-25 DIAGNOSIS — E1165 Type 2 diabetes mellitus with hyperglycemia: Secondary | ICD-10-CM | POA: Diagnosis present

## 2017-06-25 DIAGNOSIS — G4733 Obstructive sleep apnea (adult) (pediatric): Secondary | ICD-10-CM | POA: Diagnosis present

## 2017-06-25 DIAGNOSIS — M109 Gout, unspecified: Secondary | ICD-10-CM | POA: Diagnosis present

## 2017-06-25 DIAGNOSIS — J449 Chronic obstructive pulmonary disease, unspecified: Secondary | ICD-10-CM | POA: Diagnosis present

## 2017-06-25 DIAGNOSIS — Z7901 Long term (current) use of anticoagulants: Secondary | ICD-10-CM

## 2017-06-25 DIAGNOSIS — K625 Hemorrhage of anus and rectum: Secondary | ICD-10-CM

## 2017-06-25 DIAGNOSIS — D122 Benign neoplasm of ascending colon: Secondary | ICD-10-CM

## 2017-06-25 DIAGNOSIS — R188 Other ascites: Secondary | ICD-10-CM | POA: Diagnosis present

## 2017-06-25 DIAGNOSIS — D631 Anemia in chronic kidney disease: Secondary | ICD-10-CM | POA: Diagnosis present

## 2017-06-25 DIAGNOSIS — Z515 Encounter for palliative care: Secondary | ICD-10-CM | POA: Diagnosis not present

## 2017-06-25 DIAGNOSIS — D899 Disorder involving the immune mechanism, unspecified: Secondary | ICD-10-CM | POA: Diagnosis present

## 2017-06-25 DIAGNOSIS — R0602 Shortness of breath: Secondary | ICD-10-CM | POA: Diagnosis not present

## 2017-06-25 DIAGNOSIS — N186 End stage renal disease: Secondary | ICD-10-CM

## 2017-06-25 HISTORY — DX: Dyspnea, unspecified: R06.00

## 2017-06-25 LAB — BASIC METABOLIC PANEL
ANION GAP: 9 (ref 5–15)
BUN: 55 mg/dL — ABNORMAL HIGH (ref 6–20)
CALCIUM: 8.6 mg/dL — AB (ref 8.9–10.3)
CO2: 25 mmol/L (ref 22–32)
CREATININE: 3.02 mg/dL — AB (ref 0.61–1.24)
Chloride: 103 mmol/L (ref 101–111)
GFR, EST AFRICAN AMERICAN: 25 mL/min — AB (ref >60)
GFR, EST NON AFRICAN AMERICAN: 21 mL/min — AB (ref >60)
Glucose, Bld: 145 mg/dL — ABNORMAL HIGH (ref 65–99)
Potassium: 3.8 mmol/L (ref 3.5–5.1)
Sodium: 137 mmol/L (ref 135–145)

## 2017-06-25 LAB — PROTIME-INR
INR: 2.44
PROTHROMBIN TIME: 26.9 s — AB (ref 11.4–15.2)

## 2017-06-25 LAB — GLUCOSE, CAPILLARY: Glucose-Capillary: 102 mg/dL — ABNORMAL HIGH (ref 65–99)

## 2017-06-25 LAB — CBC
HCT: 24.7 % — ABNORMAL LOW (ref 39.0–52.0)
HEMOGLOBIN: 8 g/dL — AB (ref 13.0–17.0)
MCH: 24.5 pg — ABNORMAL LOW (ref 26.0–34.0)
MCHC: 32.4 g/dL (ref 30.0–36.0)
MCV: 75.5 fL — ABNORMAL LOW (ref 78.0–100.0)
PLATELETS: 177 10*3/uL (ref 150–400)
RBC: 3.27 MIL/uL — AB (ref 4.22–5.81)
RDW: 19.2 % — ABNORMAL HIGH (ref 11.5–15.5)
WBC: 4.2 10*3/uL (ref 4.0–10.5)

## 2017-06-25 LAB — HEPATIC FUNCTION PANEL
ALT: 13 U/L — AB (ref 17–63)
AST: 31 U/L (ref 15–41)
Albumin: 3 g/dL — ABNORMAL LOW (ref 3.5–5.0)
Alkaline Phosphatase: 113 U/L (ref 38–126)
BILIRUBIN DIRECT: 0.9 mg/dL — AB (ref 0.1–0.5)
BILIRUBIN INDIRECT: 0.9 mg/dL (ref 0.3–0.9)
BILIRUBIN TOTAL: 1.8 mg/dL — AB (ref 0.3–1.2)
Total Protein: 7.2 g/dL (ref 6.5–8.1)

## 2017-06-25 LAB — BRAIN NATRIURETIC PEPTIDE: B Natriuretic Peptide: 875.7 pg/mL — ABNORMAL HIGH (ref 0.0–100.0)

## 2017-06-25 LAB — GLUCOSE, POCT (MANUAL RESULT ENTRY): POC GLUCOSE: 132 mg/dL — AB (ref 70–99)

## 2017-06-25 LAB — I-STAT TROPONIN, ED: TROPONIN I, POC: 0.01 ng/mL (ref 0.00–0.08)

## 2017-06-25 MED ORDER — INSULIN DETEMIR 100 UNIT/ML ~~LOC~~ SOLN
10.0000 [IU] | Freq: Every day | SUBCUTANEOUS | Status: DC
  Administered 2017-06-26 – 2017-07-11 (×17): 10 [IU] via SUBCUTANEOUS
  Filled 2017-06-25 (×18): qty 0.1

## 2017-06-25 MED ORDER — FUROSEMIDE 10 MG/ML IJ SOLN
80.0000 mg | Freq: Two times a day (BID) | INTRAMUSCULAR | Status: DC
  Administered 2017-06-26: 80 mg via INTRAVENOUS
  Filled 2017-06-25 (×2): qty 8

## 2017-06-25 MED ORDER — TRAMADOL HCL 50 MG PO TABS
50.0000 mg | ORAL_TABLET | Freq: Two times a day (BID) | ORAL | Status: DC | PRN
  Administered 2017-06-25 – 2017-06-28 (×4): 50 mg via ORAL
  Filled 2017-06-25 (×4): qty 1

## 2017-06-25 MED ORDER — FUROSEMIDE 10 MG/ML IJ SOLN
80.0000 mg | Freq: Once | INTRAMUSCULAR | Status: AC
  Administered 2017-06-25: 80 mg via INTRAVENOUS
  Filled 2017-06-25: qty 8

## 2017-06-25 MED ORDER — INSULIN ASPART 100 UNIT/ML ~~LOC~~ SOLN
0.0000 [IU] | Freq: Every day | SUBCUTANEOUS | Status: DC
  Administered 2017-06-28: 3 [IU] via SUBCUTANEOUS
  Administered 2017-06-29 – 2017-07-11 (×3): 2 [IU] via SUBCUTANEOUS

## 2017-06-25 MED ORDER — INSULIN ASPART 100 UNIT/ML ~~LOC~~ SOLN
0.0000 [IU] | Freq: Three times a day (TID) | SUBCUTANEOUS | Status: DC
  Administered 2017-06-26 – 2017-06-27 (×5): 1 [IU] via SUBCUTANEOUS
  Administered 2017-06-28: 5 [IU] via SUBCUTANEOUS
  Administered 2017-06-28 – 2017-07-01 (×4): 2 [IU] via SUBCUTANEOUS
  Administered 2017-07-01: 3 [IU] via SUBCUTANEOUS
  Administered 2017-07-01: 1 [IU] via SUBCUTANEOUS
  Administered 2017-07-02: 2 [IU] via SUBCUTANEOUS

## 2017-06-25 MED ORDER — WARFARIN - PHARMACIST DOSING INPATIENT
Freq: Every day | Status: DC
Start: 2017-06-25 — End: 2017-06-26
  Administered 2017-06-26: 18:00:00

## 2017-06-25 MED ORDER — PANTOPRAZOLE SODIUM 40 MG PO TBEC
40.0000 mg | DELAYED_RELEASE_TABLET | Freq: Every day | ORAL | Status: DC
  Administered 2017-06-26 – 2017-07-27 (×30): 40 mg via ORAL
  Filled 2017-06-25 (×31): qty 1

## 2017-06-25 MED ORDER — ATORVASTATIN CALCIUM 20 MG PO TABS
20.0000 mg | ORAL_TABLET | Freq: Every day | ORAL | Status: DC
  Administered 2017-06-26 – 2017-07-27 (×30): 20 mg via ORAL
  Filled 2017-06-25 (×27): qty 1
  Filled 2017-06-25: qty 2
  Filled 2017-06-25 (×3): qty 1

## 2017-06-25 MED ORDER — WARFARIN SODIUM 2.5 MG PO TABS
2.5000 mg | ORAL_TABLET | Freq: Every day | ORAL | Status: DC
  Administered 2017-06-26 (×2): 2.5 mg via ORAL
  Filled 2017-06-25 (×2): qty 1

## 2017-06-25 MED ORDER — CARVEDILOL 25 MG PO TABS
25.0000 mg | ORAL_TABLET | Freq: Two times a day (BID) | ORAL | Status: DC
  Administered 2017-06-26 – 2017-06-27 (×3): 25 mg via ORAL
  Filled 2017-06-25 (×3): qty 1

## 2017-06-25 MED ORDER — FOLIC ACID 1 MG PO TABS
1.0000 mg | ORAL_TABLET | Freq: Every day | ORAL | Status: DC
  Administered 2017-06-26 – 2017-07-08 (×13): 1 mg via ORAL
  Filled 2017-06-25 (×13): qty 1

## 2017-06-25 NOTE — Progress Notes (Signed)
Subjective:  Patient ID: Michael [name], male    DOB: January 13, 1961  Age: 56 y.o. MRN: [id number]  CC: Follow-up visit  HPI Michael [name] is a 49 with a history of Type 2DM (A1c 6.0), hypertension, hyperlipidemia, mitral valve replacement with porcine valve, tricuspid valve repair, atrial fibrillation (on anticoagulation with Coumadin),Chronic systolic heartfailure  (EF 50-55% from 05/2017 ), CKD stage IV, Cirrhosis, Gout, hospitalized  For anasarca 3 weeks ago. At that hospitalization he had presented with a BNP of 1404.9; CT abdomen and pelvis revealed anasarca pattern with bilateral pleural effusions, ascites, edema throughout the mesentery and subcutaneous soft tissues as well as cardiomegaly. Paracentesis not done as ultrasound revealed small amount of abdominal ascites. He was diuresed; metoprolol was changed to carvedilol and he was subsequently discharged.  Today he is short of breath and complains of pedal edema but he emphasizes that he has been compliant with his Lasix 80 mg twice daily. History obtained by the friend with him reveals he has been eating foods high in sodium and has not maintained food restriction. He has gained 20 pounds in the last 3 weeks.  Review of his chart revealed [facility] had been unable to reach the patient for a visit however a call from [place] had reported a weight gain of 10 pounds.   Past Medical History:  Diagnosis Date  . Anemia of chronic disease   . Asthma   . Chronic kidney disease (CKD), stage IV (severe) ([place])   . Chronic systolic CHF (congestive heart failure) (HCC)    a. prior EF normal; b. Echo 10/16: Mild LVH, EF 30-35%, mitral valve bioprosthesis present without evidence of stenosis or regurgitation, severe LAE, mild RVE with mild to moderately reduced RVSF, moderate RAE  . Coronary artery disease   . Diabetes mellitus type 2 in obese ([place])   . Diabetes mellitus without complication ([place])   . GERD (gastroesophageal  reflux disease)   . H/O tricuspid valve repair 2012  . History of echocardiogram    Echo at [facility] in [facility] 4/12: mod LVH, EF 60-65%, mod LAE, tissue MVR ok, mod TR, mod pulmo HTN with RVSP 48 mmHg  . Hypertension   . Obstructive sleep apnea   . Paroxysmal atrial fibrillation (HCC)    s/p Maze procedure at time of MVR in 2011  . Pulmonary HTN ([place])   . 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 CARDIAC SURGERY      No Known Allergies   Outpatient Medications Prior to Visit  Medication Sig Dispense Refill  . carvedilol (COREG) 25 MG tablet Take 1 tablet (25 mg total) by mouth 2 (two) times daily with a meal. 60 tablet 0  . colchicine (COLCRYS) 0.6 MG tablet Take 1 tablet (0.6 mg total) by mouth daily. 7 tablet 0  . folic acid (FOLVITE) 1 MG tablet TAKE 1 TABLET BY MOUTH DAILY. 30 tablet 2  . furosemide (LASIX) 80 MG tablet Take 1 tablet (80 mg total) by mouth 2 (two) times daily. 60 tablet 3  . Insulin Detemir (LEVEMIR FLEXTOUCH) 100 UNIT/ML Pen Inject 10 Units into the skin daily at 10 pm. 15 mL 11  . omeprazole (PRILOSEC) 20 MG capsule Take 1 capsule (20 mg total) by mouth daily. 30 capsule 5  . traMADol (ULTRAM) 50 MG tablet Take 1 tablet (50 mg total) by mouth every 12 (twelve) hours as needed. 60 tablet 2  .  warfarin (COUMADIN) 2.5 MG tablet Take coumadin as directed by coumadin clinic 45 tablet 1  . acetaminophen (TYLENOL) 500 MG tablet Take 1 tablet (500 mg total) by mouth every 6 (six) hours as needed. (Patient not taking: Reported on 06/25/2017) 30 tablet 0  . atorvastatin (LIPITOR) 20 MG tablet Take 1 tablet (20 mg total) by mouth daily. (Patient not taking: Reported on 06/25/2017) 30 tablet 5   No facility-administered medications prior to visit.     ROS Review of Systems Review of Systems  Constitutional: Negative for activity change and appetite change.  HENT: Negative for sinus pressure and sore  throat.   Eyes: Negative for visual disturbance.  Respiratory: Negative for cough, chest tightness and positive for shortness of breath.   Cardiovascular: Negative for chest pain and positive for leg swelling.  Gastrointestinal: Positive for for abdominal distention, abdominal pain Endocrine: Negative.   Genitourinary: Negative for dysuria.  Musculoskeletal: Negative for joint swelling and myalgias.  Skin: Negative for rash.  Allergic/Immunologic: Negative.   Neurological: Negative for weakness, light-headedness and numbness.  Psychiatric/Behavioral: Negative for dysphoric mood and suicidal ideas.   Objective:  BP 112/67   Pulse 94   Temp (!) 97.4 F (36.3 C) (Oral)   Ht 5' 3.5" (1.613 m)   Wt 201 lb 12.8 oz (91.5 kg)   SpO2 97%   BMI 35.19 kg/m   BP/Weight 06/25/2017 06/02/2017 2/45/8099  Systolic BP 833 825 053  Diastolic BP 67 98 62  Wt. (Lbs) 201.8 185 169  BMI 35.19 30.79 29.47      Physical Exam Constitutional: Dyspneic at rest  Cardiovascular: Normal rate, normal heart sounds and intact distal pulses.  An irregularly irregular rhythm present. 3+ bilateral pedal edema No murmur heard. Pulmonary/Chest: Effort normal and breath sounds normal. He has no wheezes. He has slight basilar rales. He exhibits no tenderness.  Abdominal: Abdominal distention, ascites  Musculoskeletal: Normal range of motion.  Neurological: He is alert and oriented to person, place, and time.  Skin: Skin is warm and dry.  Psychiatric: He has a normal mood and affect.   CMP Latest Ref Rng & Units 06/02/2017 06/01/2017 05/31/2017  Glucose 65 - 99 mg/dL 124(H) 119(H) -  BUN 6 - 20 mg/dL 37(H) 41(H) -  Creatinine 0.61 - 1.24 mg/dL 2.17(H) 2.17(H) -  Sodium 135 - 145 mmol/L 133(L) 136 -  Potassium 3.5 - 5.1 mmol/L 3.8 3.8 -  Chloride 101 - 111 mmol/L 102 104 -  CO2 22 - 32 mmol/L 24 24 -  Calcium 8.9 - 10.3 mg/dL 8.5(L) 8.6(L) -  Total Protein 6.5 - 8.1 g/dL - 7.2 7.1  Total Bilirubin 0.3 - 1.2  mg/dL - 2.2(H) -  Alkaline Phos 38 - 126 U/L - 108 -  AST 15 - 41 U/L - 28 -  ALT 17 - 63 U/L - 13(L) -    Lab Results  Component Value Date   HGBA1C 5.5 05/30/2017     CLINICAL DATA:  Nausea, vomiting, diarrhea, abdominal pain  EXAM: CT ABDOMEN AND PELVIS WITHOUT CONTRAST  TECHNIQUE: Multidetector CT imaging of the abdomen and pelvis was performed following the standard protocol without IV contrast.  COMPARISON:  02/25/2016  FINDINGS: Lower chest: Cardiomegaly. Small to moderate bilateral pleural effusions, right greater than left. Bibasilar atelectasis.  Hepatobiliary: No focal hepatic abnormality. Gallbladder unremarkable.  Pancreas: No focal abnormality or ductal dilatation.  Spleen: No focal abnormality.  Normal size.  Adrenals/Urinary Tract: Large early staghorn calculus in the left kidney, the  largest area measuring up to 2 cm in the left renal pelvis. No hydronephrosis. No ureteral stones. Adrenal glands and urinary bladder unremarkable.  Stomach/Bowel: Stomach, large and small bowel grossly unremarkable.  Vascular/Lymphatic: Aortic and iliac calcifications. No aneurysm or adenopathy.  Reproductive: No visible focal abnormality.  Other: Moderate perihepatic and perisplenic free fluid. Small amount of free fluid in the cul-de-sac. There is diffuse edema throughout the mesentery and subcutaneous soft tissues.  Musculoskeletal: No acute bony abnormality.  IMPRESSION: Anasarca pattern with bilateral pleural effusions, ascites, and edema throughout the mesentery and subcutaneous soft tissues.  Cardiomegaly.  Left renal stones.  No hydronephrosis.  No evidence of bowel obstruction.   Electronically Signed   By: Rolm Baptise M.D.   On: 05/30/2017 11:01  Assessment & Plan:   1. Type 2 diabetes mellitus with complication, without long-term current use of insulin (HCC) Controlled with A1c of 5.5 - POCT glucose (manual  entry)  2. Anasarca Currently in fluid overload Referred to the ED   3. Chronic atrial fibrillation (HCC) Currently in A. fib Compliant with his Coumadin and Coreg  4. CKD (chronic kidney disease), stage IV (HCC) Last creatinine was 2.17 Currently followed by [facility]  5. Acute on chronic systolic heart failure (HCC) Acute exacerbation of heart failure with fluid overload-referred to the ED EF 50-55% from 05/2017 Emphasized the need to comply with low-sodium diet, fluid restriction to 2 L per day, daily weight checks High risk patient- will need care coordination We will be reaching out to [facility] again as we were informed his case had ben closed due to failure to reach him.  6. Essential hypertension Controlled  7. Cirrhosis Likely contributing to anasarca Will need GI referral once discharged  No orders of the defined types were placed in this encounter.   Follow-up: Return in about 1 week (around 07/02/2017) for Follow-up on CHF.   This note has been created with Surveyor, quantity. Any transcriptional errors are unintentional.     [name] MD

## 2017-06-25 NOTE — Consult Note (Signed)
   [facility] CM Inpatient Consult   06/25/2017  Michael [name] 01-21-1961 [id number]    Made aware of patient's ED admit from [facility] with [facility]. She advises that patient could really benefit from [place] [facility] program services. Was noted that [place] has made attempts to reach patient.  Will follow up at bedside with patient if admitted to the floor.   Will alert [facility] Telephonic RNCM of admit to the ED.    [name], MSN-Ed, RN,BSN [facility] Liaison [phone]

## 2017-06-25 NOTE — Patient Instructions (Signed)
Heart Failure °Heart failure means your heart has trouble pumping blood. This makes it hard for your body to work well. Heart failure is usually a long-term (chronic) condition. You must take good care of yourself and follow your doctor's treatment plan. °Follow these instructions at home: °· Take your heart medicine as told by your doctor. °? Do not stop taking medicine unless your doctor tells you to. °? Do not skip any dose of medicine. °? Refill your medicines before they run out. °? Take other medicines only as told by your doctor or pharmacist. °· Stay active if told by your doctor. The elderly and people with severe heart failure should talk with a doctor about physical activity. °· Eat heart-healthy foods. Choose foods that are without trans fat and are low in saturated fat, cholesterol, and salt (sodium). This includes fresh or frozen fruits and vegetables, fish, lean meats, fat-free or low-fat dairy foods, whole grains, and high-fiber foods. Lentils and dried peas and beans (legumes) are also good choices. °· Limit salt if told by your doctor. °· Cook in a healthy way. Roast, grill, broil, bake, poach, steam, or stir-fry foods. °· Limit fluids as told by your doctor. °· Weigh yourself every morning. Do this after you pee (urinate) and before you eat breakfast. Write down your weight to give to your doctor. °· Take your blood pressure and write it down if your doctor tells you to. °· Ask your doctor how to check your pulse. Check your pulse as told. °· Lose weight if told by your doctor. °· Stop smoking or chewing tobacco. Do not use gum or patches that help you quit without your doctor's approval. °· Schedule and go to doctor visits as told. °· Nonpregnant women should have no more than 1 drink a day. Men should have no more than 2 drinks a day. Talk to your doctor about drinking alcohol. °· Stop illegal drug use. °· Stay current with shots (immunizations). °· Manage your health conditions as told by your  doctor. °· Learn to manage your stress. °· Rest when you are tired. °· If it is really hot outside: °? Avoid intense activities. °? Use air conditioning or fans, or get in a cooler place. °? Avoid caffeine and alcohol. °? Wear loose-fitting, lightweight, and light-colored clothing. °· If it is really cold outside: °? Avoid intense activities. °? Layer your clothing. °? Wear mittens or gloves, a hat, and a scarf when going outside. °? Avoid alcohol. °· Learn about heart failure and get support as needed. °· Get help to maintain or improve your quality of life and your ability to care for yourself as needed. °Contact a doctor if: °· You gain weight quickly. °· You are more short of breath than usual. °· You cannot do your normal activities. °· You tire easily. °· You cough more than normal, especially with activity. °· You have any or more puffiness (swelling) in areas such as your hands, feet, ankles, or belly (abdomen). °· You cannot sleep because it is hard to breathe. °· You feel like your heart is beating fast (palpitations). °· You get dizzy or light-headed when you stand up. °Get help right away if: °· You have trouble breathing. °· There is a change in mental status, such as becoming less alert or not being able to focus. °· You have chest pain or discomfort. °· You faint. °This information is not intended to replace advice given to you by your health care provider. Make sure you   discuss any questions you have with your health care provider. °Document Released: 08/12/2008 Document Revised: 04/10/2016 Document Reviewed: 12/20/2012 °Elsevier Interactive Patient Education © 2017 [employer] ° °

## 2017-06-25 NOTE — H&P (Signed)
History and Physical    Michael [name] JJO:[id number] DOB: [birth year] DOA: 06/25/2017  PCP: [name], MD  Patient coming from: Home  Chief Complaint: Anasarca  HPI: Michael [name] is a 56 y.o. male with medical history significant of anemia of chronic disease, chronic kidney disease, systolic heart failure, diabetes mellitus, GERD, history of tricuspid valve repair, essential hypertension, atrial fibrillation, pulmonary hypertension. Patient presented from his PCPs office for evaluation of anasarca. Patient states that he has had increased swelling for the past 2 weeks which worsened yesterday. He reports taking his Lasix twice daily as prescribed. He reports that he has had some improvement since this morning and his swelling.  ED Course: Vitals: Afebrile, pulse of 86, respirations of 19, normotensive, 97% on room air Labs: Creatinine of 3.02, BUN of 55, glucose of 145, BNP of 875.7, hemoglobin of 8, platelets 177 Imaging: Chest x-ray significant for cardiomegaly with mild pulmonary vascular congestion and trace bilateral effusions Medications/Course: Lasix 80mg  IV  Review of Systems: Review of Systems  Constitutional: Negative for chills and fever.  Respiratory: Positive for cough. Negative for hemoptysis, sputum production, shortness of breath and wheezing.   Cardiovascular: Negative for chest pain and palpitations.  Gastrointestinal: Negative for abdominal pain, blood in stool, constipation, diarrhea, nausea and vomiting.  Endo/Heme/Allergies: Bruises/bleeds easily.  All other systems reviewed and are negative.   Past Medical History:  Diagnosis Date  . Anemia of chronic disease   . Asthma   . Chronic kidney disease (CKD), stage IV (severe) ([place])   . Chronic systolic CHF (congestive heart failure) (HCC)    a. prior EF normal; b. Echo 10/16: Mild LVH, EF 30-35%, mitral valve bioprosthesis present without evidence of stenosis or regurgitation, severe LAE, mild RVE with  mild to moderately reduced RVSF, moderate RAE  . Coronary artery disease   . Diabetes mellitus type 2 in obese ([place])   . Diabetes mellitus without complication ([place])   . GERD (gastroesophageal reflux disease)   . H/O tricuspid valve repair 2012  . History of echocardiogram    Echo at [facility] in [facility] 4/12: mod LVH, EF 60-65%, mod LAE, tissue MVR ok, mod TR, mod pulmo HTN with RVSP 48 mmHg  . Hypertension   . Obstructive sleep apnea   . Paroxysmal atrial fibrillation (HCC)    s/p Maze procedure at time of MVR in 2011  . Pulmonary HTN ([place])   . 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 CARDIAC SURGERY       reports that he has quit smoking. He has quit using smokeless tobacco. He reports that he does not drink alcohol or use drugs.  No Known Allergies  Family History  Problem Relation Age of Onset  . Heart attack Neg Hx     Prior to Admission medications   Medication Sig Start Date End Date Taking? Authorizing Provider  acetaminophen (TYLENOL) 500 MG tablet Take 1 tablet (500 mg total) by mouth every 6 (six) hours as needed. Patient not taking: Reported on 06/25/2017 10/07/16   Rosita Fire, MD  atorvastatin (LIPITOR) 20 MG tablet Take 1 tablet (20 mg total) by mouth daily. Patient not taking: Reported on 06/25/2017 03/03/17   [name], MD  carvedilol (COREG) 25 MG tablet Take 1 tablet (25 mg total) by mouth 2 (two) times daily with a meal. 06/02/17   Mikhail, Velta Addison, DO  colchicine (COLCRYS) 0.6 MG tablet Take 1 tablet (0.6 mg  total) by mouth daily. 06/02/17   Mikhail, Velta Addison, DO  folic acid (FOLVITE) 1 MG tablet TAKE 1 TABLET BY MOUTH DAILY. 04/20/17   [name], MD  furosemide (LASIX) 80 MG tablet Take 1 tablet (80 mg total) by mouth 2 (two) times daily. 12/12/16   [name], MD  Insulin Detemir (LEVEMIR FLEXTOUCH) 100 UNIT/ML Pen Inject 10 Units into the skin daily at 10 pm. 10/16/16   [name], MD  omeprazole (PRILOSEC) 20 MG capsule Take 1 capsule (20 mg total) by mouth daily. 03/03/17   [name], MD  traMADol (ULTRAM) 50 MG tablet Take 1 tablet (50 mg total) by mouth every 12 (twelve) hours as needed. 06/12/17   [name], MD  warfarin (COUMADIN) 2.5 MG tablet Take coumadin as directed by coumadin clinic 06/10/17   Fay Records, MD    Physical Exam: Vitals:   06/25/17 1249 06/25/17 1405 06/25/17 1406 06/25/17 1637  BP: 96/71 104/75  105/67  Pulse:   79 85  Resp: 18  14 14   Temp: 98.3 F (36.8 C)     TempSrc: Oral     SpO2: 93%  97% 98%     Constitutional: NAD, calm, comfortable Eyes: PERRL, lids and conjunctivae normal ENMT: Mucous membranes are moist. Posterior pharynx clear of any exudate or lesions. Assessment assessment Neck: normal, supple, no masses, no thyromegaly Respiratory: clear to auscultation bilaterally, no wheezing, no crackles. Normal respiratory effort. No accessory muscle use.  Cardiovascular: Regular rate and irregular rhythm, 1/6 systolic murmur. 2+ extremity edema. No carotid bruits.  Abdomen: no tenderness, no masses palpated. Distended. Bowel sounds positive.  Musculoskeletal: no clubbing / cyanosis. No joint deformity upper and lower extremities. Good ROM, no contractures. Normal muscle tone.  Skin: some ecchymotic lesions. No induration Neurologic: CN 2-12 grossly intact. Sensation intact. Strength 5/5 in all 4.  Psychiatric: Normal judgment and insight. Alert and oriented x 3. Normal mood.   Labs on Admission: I have personally reviewed following labs and imaging studies  CBC:  Recent Labs Lab 06/25/17 1300  WBC 4.2  HGB 8.0*  HCT 24.7*  MCV 75.5*  PLT 595   Basic Metabolic Panel:  Recent Labs Lab 06/25/17 1300  NA 137  K 3.8  CL 103  CO2 25  GLUCOSE 145*  BUN 55*  CREATININE 3.02*  CALCIUM 8.6*   GFR: Estimated Creatinine Clearance: 27.3 mL/min (A) (by C-G formula based on SCr of 3.02 mg/dL (H)). Liver  Function Tests: No results for input(s): AST, ALT, ALKPHOS, BILITOT, PROT, ALBUMIN in the last 168 hours. No results for input(s): LIPASE, AMYLASE in the last 168 hours. No results for input(s): AMMONIA in the last 168 hours. Coagulation Profile: No results for input(s): INR, PROTIME in the last 168 hours. Cardiac Enzymes: No results for input(s): CKTOTAL, CKMB, CKMBINDEX, TROPONINI in the last 168 hours. BNP (last 3 results)  Recent Labs  03/16/17 1401  PROBNP 6,530*   HbA1C: No results for input(s): HGBA1C in the last 72 hours. CBG: No results for input(s): GLUCAP in the last 168 hours. Lipid Profile: No results for input(s): CHOL, HDL, LDLCALC, TRIG, CHOLHDL, LDLDIRECT in the last 72 hours. Thyroid Function Tests: No results for input(s): TSH, T4TOTAL, FREET4, T3FREE, THYROIDAB in the last 72 hours. Anemia Panel: No results for input(s): VITAMINB12, FOLATE, FERRITIN, TIBC, IRON, RETICCTPCT in the last 72 hours. Urine analysis:    Component Value Date/Time   COLORURINE YELLOW 05/30/2017 [place] 05/30/2017 0538  LABSPEC 1.013 05/30/2017 0538   PHURINE 5.0 05/30/2017 0538   GLUCOSEU NEGATIVE 05/30/2017 0538   HGBUR SMALL (A) 05/30/2017 0538   BILIRUBINUR NEGATIVE 05/30/2017 0538   KETONESUR NEGATIVE 05/30/2017 0538   PROTEINUR 100 (A) 05/30/2017 0538   NITRITE NEGATIVE 05/30/2017 0538   LEUKOCYTESUR TRACE (A) 05/30/2017 0538   Sepsis Labs: !!!!!!!!!!!!!!!!!!!!!!!!!!!!!!!!!!!!!!!!!!!! @LABRCNTIP (procalcitonin:4,lacticidven:4) )No results found for this or any previous visit (from the past 240 hour(s)).   Radiological Exams on Admission: Dg Chest 2 View  Result Date: 06/25/2017 CLINICAL DATA:  Acute shortness of breath for 2 days. EXAM: CHEST  2 VIEW COMPARISON:  05/31/2017 and prior radiographs FINDINGS: Cardiomegaly and median sternotomy again noted. Mild pulmonary vascular congestion and bibasilar opacities are again noted. Trace bilateral pleural  effusions are present. There is no evidence of pneumothorax or acute bony abnormality. IMPRESSION: Cardiomegaly with mild pulmonary vascular congestion. Unchanged bibasilar opacities which may represent atelectasis/scarring/airspace disease. Trace bilateral pleural effusions. Electronically Signed   By: Margarette Canada M.D.   On: 06/25/2017 13:37   EKG: Independently reviewed. Atrial fibrillation. t-wave abnormalities in diffuse leads, unchanged from previous  Assessment/Plan Principal Problem:   Acute kidney injury ([place]) Active Problems:   CKD (chronic kidney disease), stage IV (HCC)   DM type 2 (diabetes mellitus, type 2) ([place])   Essential hypertension   Chronic systolic CHF (congestive heart failure) (HCC)   Gout   Chronic atrial fibrillation (HCC)   Anasarca  Acute kidney injury on CKD IV Baseline creatinine of 2.2-2.3. 3.07 on admission. Secondary to hypovolemia from third spacing. No evidence of acute heart failure. -Lasix 80mg  BID -recheck BMP in AM -strict in/out -renal ultrasound  Anasarca Secondary to liver cirrhosis. Albumin 2.7 in 05/2017. Has had minimal ascites in the past not amenable to paracentesis. -lasix as above  Cirrhosis Contributing to overall anasarca. LFTs not obtained. INR pending -follow-up INR -add-on hepatic function panel -outpatient GI follow-up  History of systolic heart failure Recent echocardiogram on 06/01/17 significant for EF of 50-55% improved from 35-40% on 04/08/17. Overloaded on exam, however, lungs clear. BNP elevated but down from previous. -continue Coreg  Essential hypertension Stable. -continue home Coreg  Hyperlipidemia -continue home atorvastatin  Chronic atrial fibrillation CHA2DS2-VASc Score is 3. On chronic anticoagulation. -INR pending -warfarin per pharmacy  Anemia of chronic disease Baseline hemoglobin of 9-10. 8 on admission. No evidence of bleeding. -CBC in AM -FOBT -INR pending  Diabetes mellitus, type II Last  A1c of 5.5 in 05/2017 -continue Levemir -SSI  Pulmonary artery hypertension -outpatient management.  GERD -continue PPI (omeprazole at home)  History of gout -discontinue colchicine  S/p mitral/tricusbid valve repair/replacement  DVT prophylaxis: Warfarin per pharmacy Code Status: Full code Family Communication: None at bedside Disposition Plan: Discharge likely in 24 hours if able to diurese adequately Consults called: None Admission status: Observation, med-surg   Cordelia Poche, MD [facility] Pager [phone]  If 7PM-7AM, please contact night-coverage [email or web address] Password [facility]  06/25/2017, 5:36 PM

## 2017-06-25 NOTE — Patient Outreach (Signed)
[facility] [facility]) Care Management  06/25/2017  Michael [name] 1961-03-13 [id number]  EMMI: Heart failure Referral date: 06/08/17 Referral source: EMMI heart failure red alert Referral reason: New worsening problem: YES, New swelling: YES, New shortness of breath: YES Day # 18   Telephone call to patient regarding EMMI heart failure red alert. Unable to reach patient. HIPAA compliant voice message left with call back phone number.    PLAN: Outreach letter sent to patient on  06/11/17.  If no response within 10 business days, RNCM will close patient to [place] RN,BSN,CCM [facility] Telephonic  [phone]

## 2017-06-25 NOTE — ED Triage Notes (Signed)
Pt states sent from MD office for evaluation of weight gain and swelling to legs and abdomen.   Pt also reports shortness of breath for a couple days.

## 2017-06-25 NOTE — Progress Notes (Signed)
ANTICOAGULATION CONSULT NOTE - Initial Consult  Pharmacy Consult for warfarin Indication: atrial fibrillation  No Known Allergies  Patient Measurements: Height: 5\' 3"  (160 cm) Weight: 201 lb 1 oz (91.2 kg) IBW/kg (Calculated) : 56.9  Vital Signs: Temp: 98.7 F (37.1 C) (08/09 2007) Temp Source: Oral (08/09 2007) BP: 115/83 (08/09 2007) Pulse Rate: 77 (08/09 2007)  Labs:  Recent Labs  06/25/17 1252 06/25/17 1300  HGB  --  8.0*  HCT  --  24.7*  PLT  --  177  LABPROT 26.9*  --   INR 2.44  --   CREATININE  --  3.02*    Estimated Creatinine Clearance: 26.9 mL/min (A) (by C-G formula based on SCr of 3.02 mg/dL (H)).   Medical History: Past Medical History:  Diagnosis Date  . Anemia of chronic disease   . Asthma   . Chronic kidney disease (CKD), stage IV (severe) ([place])   . Chronic systolic CHF (congestive heart failure) (HCC)    a. prior EF normal; b. Echo 10/16: Mild LVH, EF 30-35%, mitral valve bioprosthesis present without evidence of stenosis or regurgitation, severe LAE, mild RVE with mild to moderately reduced RVSF, moderate RAE  . Coronary artery disease   . Diabetes mellitus type 2 in obese ([place])   . Diabetes mellitus without complication ([place])   . GERD (gastroesophageal reflux disease)   . H/O tricuspid valve repair 2012  . History of echocardiogram    Echo at [facility] in [facility] 4/12: mod LVH, EF 60-65%, mod LAE, tissue MVR ok, mod TR, mod pulmo HTN with RVSP 48 mmHg  . Hypertension   . Obstructive sleep apnea   . Paroxysmal atrial fibrillation (HCC)    s/p Maze procedure at time of MVR in 2011  . Pulmonary HTN ([place])   . S/P mitral valve replacement with porcine valve 2012    Assessment: Pharmacy consulted to dose warfarin. Pt on warfarin for afib and is stable on home dose. Last anticoag visit on 8/3, patient INR 2.5 on warfarin 2.5 mg daily. Pt states he has not taken today. INR is currently therapeutic at 2.44  HPI: Patient presented from his PCPs  office for evaluation of anasarca.  Goal of Therapy:  INR 2-3 Monitor platelets by anticoagulation protocol: Yes   Plan:  Continue warfarin 2.5 mg PO daily (including tonight) Monitor daily INR, CBC, clinical course, s/sx of bleed, PO intake, DDI   Thank you for allowing [place] to participate in this patients care. Jens Som, PharmD Clinical phone # [phone]

## 2017-06-25 NOTE — Progress Notes (Signed)
Opened in error

## 2017-06-25 NOTE — ED Provider Notes (Addendum)
[place] DEPT Provider Note   CSN: [id number] Arrival date & time: 06/25/17  1153     History   Chief Complaint Chief Complaint  Patient presents with  . Shortness of Breath  . Congestive Heart Failure    HPI Michael [name] is a 56 y.o. male.   Patient with recent admission. Patient was admitted for similar findings July 14 through July 17. Patient's returning here today with marked swelling of legs and whole body edema. Patient's known to have a history of anasarca. Also known to have a history of congestive heart failure. Patient brought in by family members.      Past Medical History:  Diagnosis Date  . Anemia of chronic disease   . Asthma   . Chronic kidney disease (CKD), stage IV (severe) ([place])   . Chronic systolic CHF (congestive heart failure) (HCC)    a. prior EF normal; b. Echo 10/16: Mild LVH, EF 30-35%, mitral valve bioprosthesis present without evidence of stenosis or regurgitation, severe LAE, mild RVE with mild to moderately reduced RVSF, moderate RAE  . Coronary artery disease   . Diabetes mellitus type 2 in obese ([place])   . Diabetes mellitus without complication ([place])   . GERD (gastroesophageal reflux disease)   . H/O tricuspid valve repair 2012  . History of echocardiogram    Echo at [facility] in [facility] 4/12: mod LVH, EF 60-65%, mod LAE, tissue MVR ok, mod TR, mod pulmo HTN with RVSP 48 mmHg  . Hypertension   . Obstructive sleep apnea   . Paroxysmal atrial fibrillation (HCC)    s/p Maze procedure at time of MVR in 2011  . Pulmonary HTN ([place])   . S/P mitral valve replacement with porcine valve 2012    Patient Active Problem List   Diagnosis Date Noted  . Anasarca 05/30/2017  . Acute exacerbation of CHF (congestive heart failure) ([place]) 05/30/2017  . HLD (hyperlipidemia) 10/05/2016  . Mouth bleeding 10/05/2016  . Bilateral knee pain 10/05/2016  . Petechial rash 10/05/2016  . Laceration of tongue   . Insomnia 06/10/2016  . Supratherapeutic  INR 06/01/2016  . Acute hepatic encephalopathy 03/15/2016  . Liver cirrhosis ([place]) 03/15/2016  . Volume overload 03/15/2016  . Acute congestive heart failure ([place])   . Insulin dependent diabetes mellitus ([place])   . Acute renal failure (ARF) ([place]) 03/14/2016  . GERD (gastroesophageal reflux disease) 03/04/2016  . Aortic regurgitation 02/27/2016  . Viral gastroenteritis 02/27/2016  . A-fib ([place]) 02/26/2016  . Abdominal pain 02/25/2016  . Diabetes mellitus with complication ([place]) 81/27/5170  . Chronic atrial fibrillation with RVR ([place]) 02/25/2016  . Acute on chronic systolic CHF (congestive heart failure) ([place]) 02/25/2016  . Anemia, chronic disease 02/25/2016  . Atrial fibrillation with RVR ([place]) 02/19/2016  . Pain in joint, ankle and foot 01/10/2016  . Asthma   . Acute hypoxemic respiratory failure ([place]) 12/29/2015  . Acute respiratory failure ([place]) 12/29/2015  . Chronic atrial fibrillation ([place]) 12/24/2015  . Dilated cardiomyopathy ([place]) 12/24/2015  . Polypharmacy 11/23/2015  . Gout 10/23/2015  . Encounter for therapeutic drug monitoring 10/19/2015  . Anemia - mild exacerbation 10/15/2015  . Chronic systolic CHF (congestive heart failure) ([place]) 10/09/2015  . Tricuspid valve disease 10/09/2015  . Mitral valve disease   . CKD (chronic kidney disease), stage IV ([place]) 09/10/2015  . Calf pain 09/10/2015  . DM type 2 (diabetes mellitus, type 2) ([place]) 09/10/2015  . Essential hypertension 09/10/2015    Past Surgical History:  Procedure Laterality Date  .  CARDIAC SURGERY         Home Medications    Prior to Admission medications   Medication Sig Start Date End Date Taking? Authorizing Provider  acetaminophen (TYLENOL) 500 MG tablet Take 1 tablet (500 mg total) by mouth every 6 (six) hours as needed. Patient not taking: Reported on 06/25/2017 10/07/16   Rosita Fire, MD  atorvastatin (LIPITOR) 20 MG tablet Take 1 tablet (20 mg total) by mouth daily. Patient not taking:  Reported on 06/25/2017 03/03/17   [name], MD  carvedilol (COREG) 25 MG tablet Take 1 tablet (25 mg total) by mouth 2 (two) times daily with a meal. 06/02/17   Mikhail, Velta Addison, DO  colchicine (COLCRYS) 0.6 MG tablet Take 1 tablet (0.6 mg total) by mouth daily. 06/02/17   Mikhail, Velta Addison, DO  folic acid (FOLVITE) 1 MG tablet TAKE 1 TABLET BY MOUTH DAILY. 04/20/17   [name], MD  furosemide (LASIX) 80 MG tablet Take 1 tablet (80 mg total) by mouth 2 (two) times daily. 12/12/16   [name], MD  Insulin Detemir (LEVEMIR FLEXTOUCH) 100 UNIT/ML Pen Inject 10 Units into the skin daily at 10 pm. 10/16/16   [name], MD  omeprazole (PRILOSEC) 20 MG capsule Take 1 capsule (20 mg total) by mouth daily. 03/03/17   [name], MD  traMADol (ULTRAM) 50 MG tablet Take 1 tablet (50 mg total) by mouth every 12 (twelve) hours as needed. 06/12/17   [name], MD  warfarin (COUMADIN) 2.5 MG tablet Take coumadin as directed by coumadin clinic 06/10/17   Fay Records, MD    Family History Family History  Problem Relation Age of Onset  . Heart attack Neg Hx     Social History Social History  Substance Use Topics  . Smoking status: Former Research scientist (life sciences)  . Smokeless tobacco: Former Systems developer  . Alcohol use No     Allergies   Patient has no known allergies.   Review of Systems Review of Systems  Constitutional: Positive for fatigue.  HENT: Negative for congestion.   Eyes: Negative for visual disturbance.  Cardiovascular: Positive for leg swelling.  Gastrointestinal: Positive for abdominal distention.  Genitourinary: Negative for hematuria.  Musculoskeletal: Negative for myalgias.  Skin: Negative for wound.  Neurological: Negative for headaches.  Psychiatric/Behavioral: Positive for confusion.     Physical Exam Updated Vital Signs BP 101/80 (BP Location: Right Arm)   Pulse 86   Temp 98.3 F (36.8 C) (Oral)   Resp 19   SpO2 97%   Physical Exam  Constitutional: He is oriented to  person, place, and time. He appears well-developed and well-nourished. No distress.  HENT:  Head: Normocephalic and atraumatic.  Mouth/Throat: Oropharynx is clear and moist.  Eyes: Pupils are equal, round, and reactive to light. Conjunctivae and EOM are normal.  Some swelling to his upper eyelids.  Neck: Neck supple.  Cardiovascular: Normal rate and regular rhythm.   Pulmonary/Chest: Effort normal and breath sounds normal. No respiratory distress.  Abdominal: Soft. Bowel sounds are normal. He exhibits distension.  Abdominal wall edema abdomen nontender. Some erythema to the abdominal wall.  Musculoskeletal: Normal range of motion. He exhibits edema.  Marked swelling to lower extremities. There is some erythema. More suggestive of just a chronic fluid changes not cellulitis. There is also marked swelling to the abdominal wall with some erythema. Marked pitting edema bilaterally.  Neurological: He is alert and oriented to person, place, and time. No cranial nerve deficit or sensory deficit. He exhibits normal  muscle tone. Coordination normal.  Skin: Skin is warm. There is erythema.  Nursing note and vitals reviewed.    ED Treatments / Results  Labs (all labs ordered are listed, but only abnormal results are displayed) Labs Reviewed  BASIC METABOLIC PANEL - Abnormal; Notable for the following:       Result Value   Glucose, Bld 145 (*)    BUN 55 (*)    Creatinine, Ser 3.02 (*)    Calcium 8.6 (*)    GFR calc non Af Amer 21 (*)    GFR calc Af Amer 25 (*)    All other components within normal limits  CBC - Abnormal; Notable for the following:    RBC 3.27 (*)    Hemoglobin 8.0 (*)    HCT 24.7 (*)    MCV 75.5 (*)    MCH 24.5 (*)    RDW 19.2 (*)    All other components within normal limits  BRAIN NATRIURETIC PEPTIDE - Abnormal; Notable for the following:    B Natriuretic Peptide 875.7 (*)    All other components within normal limits  PROTIME-INR  I-STAT TROPONIN, ED    EKG  EKG  Interpretation  Date/Time:  Thursday June 25 2017 12:47:54 EDT Ventricular Rate:  98 PR Interval:    QRS Duration: 108 QT Interval:  390 QTC Calculation: 497 R Axis:   -19 Text Interpretation:  Atrial fibrillation Low voltage QRS Incomplete right bundle branch block ST & T wave abnormality, consider lateral ischemia or digitalis effect Prolonged QT Abnormal ECG Confirmed by Fredia Sorrow [phone]) on 06/25/2017 2:47:55 PM       Radiology Dg Chest 2 View  Result Date: 06/25/2017 CLINICAL DATA:  Acute shortness of breath for 2 days. EXAM: CHEST  2 VIEW COMPARISON:  05/31/2017 and prior radiographs FINDINGS: Cardiomegaly and median sternotomy again noted. Mild pulmonary vascular congestion and bibasilar opacities are again noted. Trace bilateral pleural effusions are present. There is no evidence of pneumothorax or acute bony abnormality. IMPRESSION: Cardiomegaly with mild pulmonary vascular congestion. Unchanged bibasilar opacities which may represent atelectasis/scarring/airspace disease. Trace bilateral pleural effusions. Electronically Signed   By: Margarette Canada M.D.   On: 06/25/2017 13:37    Procedures Procedures (including critical care time)  Medications Ordered in ED Medications  furosemide (LASIX) injection 80 mg (80 mg Intravenous Given 06/25/17 1748)     Initial Impression / Assessment and Plan / ED Course  I have reviewed the triage vital signs and the nursing notes.  Pertinent labs & imaging results that were available during my care of the patient were reviewed by me and considered in my medical decision making (see chart for details).     He patient will be readmitted by the triad hospitalist service for the whole body fluid overload. Also has some acute renal failure. Patient was admittedJuly 14 and discharged on July 17 for similar symptoms. Patient was seen by wellness clinic provider today and appropriately referred in for the significant body water gain. Patient  stathe's been taking his medicines. Patient was given some IV Lasix here.Patient had the complaint of shortness of breath. But on room air sats patient with adequate oxygenation and 93-97%. Patient shows evidence of swelling of his upper eyelids as well as his abdomen and marked swelling of both lower extremities. Some evidence of some swelling of the upper extremities 2. Hospitalist team will admit.  Workup here patient's BUN and creatinine markedly changed from his discharge on July 17. Also patient's BNP  is elevated similar to his admitting range. Patient's chest x-ray though does not show significant pulmonary edema. However CT chest was not done.  Patient has anemia with hemoglobin of 8. Possible some of this could be delusional.also could be contributing to his shortness of breath.   Final Clinical Impressions(s) / ED Diagnoses   Final diagnoses:  Anasarca  Acute renal failure, unspecified acute renal failure type [facility])    New Prescriptions New Prescriptions   No medications on file     Fredia Sorrow, MD 06/25/17 1814    Fredia Sorrow, MD 08/02/17 [phone]

## 2017-06-25 NOTE — ED Notes (Signed)
Patient given to mouth swab.

## 2017-06-25 NOTE — Telephone Encounter (Signed)
After seeing the patient in the clinic today, Dr Jarold Song determined he needs to be assessed in the ED, so patient was sent to  [facility] ED.  Dr Jarold Song  is requesting [facility]  follow up and an appointment was scheduled for him  - 07/03/17 @ 1400@ [place]   [name], RN/THN notified that the patient is being sent to [facility] ED and [facility] follow up requested to assist with coordination of services and oversight of medical management in the community.  She noted that she would follow the patient after admission to the hospital but would not be able to see him in the ED.

## 2017-06-26 ENCOUNTER — Other Ambulatory Visit: Payer: Self-pay

## 2017-06-26 ENCOUNTER — Telehealth: Payer: Self-pay

## 2017-06-26 ENCOUNTER — Encounter (HOSPITAL_COMMUNITY): Payer: Self-pay

## 2017-06-26 DIAGNOSIS — I5043 Acute on chronic combined systolic (congestive) and diastolic (congestive) heart failure: Secondary | ICD-10-CM | POA: Diagnosis not present

## 2017-06-26 DIAGNOSIS — N184 Chronic kidney disease, stage 4 (severe): Secondary | ICD-10-CM | POA: Diagnosis not present

## 2017-06-26 DIAGNOSIS — R601 Generalized edema: Secondary | ICD-10-CM

## 2017-06-26 DIAGNOSIS — I480 Paroxysmal atrial fibrillation: Secondary | ICD-10-CM

## 2017-06-26 DIAGNOSIS — I5033 Acute on chronic diastolic (congestive) heart failure: Secondary | ICD-10-CM | POA: Diagnosis not present

## 2017-06-26 DIAGNOSIS — N179 Acute kidney failure, unspecified: Secondary | ICD-10-CM | POA: Diagnosis not present

## 2017-06-26 LAB — BASIC METABOLIC PANEL
Anion gap: 9 (ref 5–15)
BUN: 55 mg/dL — ABNORMAL HIGH (ref 6–20)
CALCIUM: 8.5 mg/dL — AB (ref 8.9–10.3)
CO2: 26 mmol/L (ref 22–32)
CREATININE: 2.83 mg/dL — AB (ref 0.61–1.24)
Chloride: 103 mmol/L (ref 101–111)
GFR calc non Af Amer: 23 mL/min — ABNORMAL LOW (ref >60)
GFR, EST AFRICAN AMERICAN: 27 mL/min — AB (ref >60)
Glucose, Bld: 127 mg/dL — ABNORMAL HIGH (ref 65–99)
Potassium: 3.7 mmol/L (ref 3.5–5.1)
Sodium: 138 mmol/L (ref 135–145)

## 2017-06-26 LAB — GLUCOSE, CAPILLARY
GLUCOSE-CAPILLARY: 134 mg/dL — AB (ref 65–99)
GLUCOSE-CAPILLARY: 150 mg/dL — AB (ref 65–99)
GLUCOSE-CAPILLARY: 113 mg/dL — AB (ref 65–99)
Glucose-Capillary: 85 mg/dL (ref 65–99)

## 2017-06-26 LAB — CBC
HEMATOCRIT: 23.9 % — AB (ref 39.0–52.0)
Hemoglobin: 7.8 g/dL — ABNORMAL LOW (ref 13.0–17.0)
MCH: 24.6 pg — AB (ref 26.0–34.0)
MCHC: 32.6 g/dL (ref 30.0–36.0)
MCV: 75.4 fL — AB (ref 78.0–100.0)
Platelets: 181 10*3/uL (ref 150–400)
RBC: 3.17 MIL/uL — ABNORMAL LOW (ref 4.22–5.81)
RDW: 19.2 % — AB (ref 11.5–15.5)
WBC: 3.6 10*3/uL — ABNORMAL LOW (ref 4.0–10.5)

## 2017-06-26 LAB — PROTIME-INR
INR: 2.45
Prothrombin Time: 27 seconds — ABNORMAL HIGH (ref 11.4–15.2)

## 2017-06-26 MED ORDER — FERROUS SULFATE 325 (65 FE) MG PO TABS
325.0000 mg | ORAL_TABLET | Freq: Two times a day (BID) | ORAL | Status: DC
  Administered 2017-06-26 – 2017-07-05 (×18): 325 mg via ORAL
  Filled 2017-06-26 (×19): qty 1

## 2017-06-26 MED ORDER — FUROSEMIDE 10 MG/ML IJ SOLN
120.0000 mg | Freq: Two times a day (BID) | INTRAVENOUS | Status: DC
  Administered 2017-06-26 – 2017-06-27 (×2): 120 mg via INTRAVENOUS
  Filled 2017-06-26 (×2): qty 12

## 2017-06-26 MED ORDER — MILRINONE LACTATE IN DEXTROSE 20-5 MG/100ML-% IV SOLN
0.2500 ug/kg/min | INTRAVENOUS | Status: DC
  Administered 2017-06-26 – 2017-06-30 (×7): 0.25 ug/kg/min via INTRAVENOUS
  Filled 2017-06-26 (×8): qty 100

## 2017-06-26 NOTE — Patient Outreach (Signed)
[place] [facility]) Care Management  06/26/2017  Drevin Kruser 1961-11-07 [id number]   EMMI: Heart failure Referral date: 06/08/17 Referral source: EMMI heart failure red alert Referral reason: New worsening problem: YES, New swelling: YES, New shortness of breath: YES Day # 18  Received notification from [place], hospital liaison that patient has been admitted to the hospital.  EMMI heart failure calls to be discontinued  and patient will be closed to [facility] telephone follow up at this time.   PLAN:  RNCM will refer patient to care management assistant to close to telephone follow up due to hospital readmission. RNCM will notify patients primary MD that patient is closed to [facility] telephone follow up.  Quinn Plowman RN,BSN,CCM [facility] Telephonic  [phone]

## 2017-06-26 NOTE — Progress Notes (Signed)
Addendum  Cardiology input appreciated. They have increased IV Lasix, added low-dose milrinone, recommend holding Coumadin in anticipation of right heart catheterization and add heparin when INR <2.  This requires close monitoring and higher level of care. Hence transfer patient to stepdown unit. Discussed with RN.  Vernell Leep, MD, FACP, FHM. [facility] Pager [phone]  If 7PM-7AM, please contact night-coverage [email or web address] Password [facility] 06/26/2017, 6:28 PM

## 2017-06-26 NOTE — Care Management Note (Addendum)
Case Management Note  Patient Details  Name: Michael [name] MRN: [id number] Date of Birth: [birth year]  Subjective/Objective:     CM following for progression and d/c planning.                Action/Plan: 06/26/2017 Met with pt re OBS status. Pt will be followed by [facility] and is a pt at [facility]). He has an appointment currently scheduled for Friday, July 03, 2017. Pt is also active with [facility] for [facility] and [place] services. This CM contacted [facility] re pt admission. [facility] visited pt today and followed up with [name], nurse case manager for [facility].    Expected Discharge Date:                  Expected Discharge Plan:  [place]  In-House Referral:  NA  Discharge planning Services  CM Consult, Follow-up appt scheduled  Post Acute Care Choice:  Home Health Choice offered to:  Patient  DME Arranged:    DME Agency:     HH Arranged:  RN, PT [name] Agency:  [facility] (now [facility])  Status of Service:  In process, will continue to follow  If discussed at Long Length of Stay Meetings, dates discussed:    Additional Comments:  [name], RN 06/26/2017, 2:07 PM

## 2017-06-26 NOTE — Progress Notes (Signed)
Pharmacy Consult for Milrinone (Primacor) Initiation  Indication:   Acute Decompensated Heart Failure with volume overload and low cardiac output  No Known Allergies  Temp:  [98.4 F (36.9 C)-98.7 F (37.1 C)] 98.4 F (36.9 C) (08/10 0900) Pulse Rate:  [77-115] 96 (08/10 0900) Cardiac Rhythm: Atrial fibrillation (08/10 1200) Resp:  [18-23] 18 (08/10 0900) BP: (99-115)/(50-89) 105/66 (08/10 0900) SpO2:  [97 %-100 %] 98 % (08/10 0900) Weight:  [201 lb 1 oz (91.2 kg)] 201 lb 1 oz (91.2 kg) (08/09 2007)  LABS    Component Value Date/Time   NA 138 06/26/2017 0422   NA 140 03/30/2017 1406   K 3.7 06/26/2017 0422   CL 103 06/26/2017 0422   CO2 26 06/26/2017 0422   GLUCOSE 127 (H) 06/26/2017 0422   BUN 55 (H) 06/26/2017 0422   BUN 33 (H) 03/30/2017 1406   CREATININE 2.83 (H) 06/26/2017 0422   CREATININE 1.61 (H) 01/16/2017 1653   CALCIUM 8.5 (L) 06/26/2017 0422   GFRNONAA 23 (L) 06/26/2017 0422   GFRNONAA 47 (L) 01/16/2017 1653   GFRAA 27 (L) 06/26/2017 0422   GFRAA 54 (L) 01/16/2017 1653   Last magnesium:  Lab Results  Component Value Date   MG 1.8 02/25/2016   Estimated Creatinine Clearance: 29.9 mL/min (A) (by C-G formula based on SCr of 2.83 mg/dL (H)). Serum creatinine: 2.83 mg/dL (H) 06/26/17 0422 Estimated creatinine clearance: 29.9 mL/min (A) estimated creatinine clearance is 29.9 mL/min (A) (by C-G formula based on SCr of 2.83 mg/dL (H)).   Intake/Output Summary (Last 24 hours) at 06/26/17 1739 Last data filed at 06/26/17 1300  Gross per 24 hour  Intake              822 ml  Output              840 ml  Net              -18 ml    Filed Weights   06/25/17 2007  Weight: 201 lb 1 oz (91.2 kg)    Assessment:  56 y.o. male admitted  with acute decompensated congestive heart failure to be initiated on milrinone.  Patient with EF 35-40%. Patient was on IV furosemide 80mg  every 12hrs with little response. Plan is to initiate milrinone for inotropic  support. -SCr= 2.83 (baseline appears to be ~ 2.0 but history is variable) -K= 3.7   Plan:  - Initiate milrinone based on renal function: (Consider starting dose of 0.125 - 0.25 for patients with SBP <180mmHg) [employer] Calculated CrCl Dose  []  > 50 ml/min 0.375 mcg/kg/min  [x]  20-49 ml/min 0.250 mcg/kg/min  []  < 20 ml/min 0.125 mcg/kg/min   -Check a magnesium level in am -Hold on replacing potassium with renal function -Nursing to monitor vital signs per milrinone protocol and physician parameters. - Pharmacy to follow peripherally, please reconsult if needed or there is further questions. - Please contact MD for further dosing instructions.  Thank you for allowing [place] to be a part of this patient's care.  Hildred Laser, Pharm D 06/26/2017 5:44 PM

## 2017-06-26 NOTE — Progress Notes (Signed)
[place] for warfarin Indication: atrial fibrillation  No Known Allergies  Labs:  Recent Labs  06/25/17 1252 06/25/17 1300 06/26/17 0422  HGB  --  8.0* 7.8*  HCT  --  24.7* 23.9*  PLT  --  177 181  LABPROT 26.9*  --  27.0*  INR 2.44  --  2.45  CREATININE  --  3.02* 2.83*    Estimated Creatinine Clearance: 29.9 mL/min (A) (by C-G formula based on SCr of 2.83 mg/dL (H)).   Medical History: Past Medical History:  Diagnosis Date  . Anemia of chronic disease   . Asthma    said he does not know  . Chronic kidney disease (CKD), stage IV (severe) ([place])   . Chronic systolic CHF (congestive heart failure) (HCC)    a. prior EF normal; b. Echo 10/16: Mild LVH, EF 30-35%, mitral valve bioprosthesis present without evidence of stenosis or regurgitation, severe LAE, mild RVE with mild to moderately reduced RVSF, moderate RAE  . Coronary artery disease   . Diabetes mellitus type 2 in obese ([place])   . Diabetes mellitus without complication ([place])   . Dyspnea   . GERD (gastroesophageal reflux disease)   . H/O tricuspid valve repair 2012  . History of echocardiogram    Echo at [facility] in [facility] 4/12: mod LVH, EF 60-65%, mod LAE, tissue MVR ok, mod TR, mod pulmo HTN with RVSP 48 mmHg  . Hypertension   . Obstructive sleep apnea   . Paroxysmal atrial fibrillation (HCC)    s/p Maze procedure at time of MVR in 2011  . Pulmonary HTN ([place])   . S/P mitral valve replacement with porcine valve 2012    Assessment: Pharmacy consulted to dose warfarin. Pt on warfarin for afib and is stable on home dose. Last anticoag visit on 8/3, patient INR 2.5 on warfarin 2.5 mg daily. Pt states he has not taken today. INR is currently therapeutic at 2.45  HPI: Patient presented from his PCPs office for evaluation of anasarca.  Goal of Therapy:  INR 2-3 Monitor platelets by anticoagulation protocol: Yes   Plan:  Continue warfarin 2.5 mg PO daily (including  tonight) Monitor daily INR, CBC, clinical course, s/sx of bleed, PO intake, DDI   Thank you  Anette Guarneri, PharmD [phone]

## 2017-06-26 NOTE — Progress Notes (Signed)
[place] for warfarin>> heparin Indication: atrial fibrillation  No Known Allergies  Labs:  Recent Labs  06/25/17 1252 06/25/17 1300 06/26/17 0422  HGB  --  8.0* 7.8*  HCT  --  24.7* 23.9*  PLT  --  177 181  LABPROT 26.9*  --  27.0*  INR 2.44  --  2.45  CREATININE  --  3.02* 2.83*    Estimated Creatinine Clearance: 29.9 mL/min (A) (by C-G formula based on SCr of 2.83 mg/dL (H)).   Medical History: Past Medical History:  Diagnosis Date  . Anemia of chronic disease   . Asthma    said he does not know  . Chronic kidney disease (CKD), stage IV (severe) ([place])   . Chronic systolic CHF (congestive heart failure) (HCC)    a. prior EF normal; b. Echo 10/16: Mild LVH, EF 30-35%, mitral valve bioprosthesis present without evidence of stenosis or regurgitation, severe LAE, mild RVE with mild to moderately reduced RVSF, moderate RAE  . Coronary artery disease   . Diabetes mellitus type 2 in obese ([place])   . Diabetes mellitus without complication ([place])   . Dyspnea   . GERD (gastroesophageal reflux disease)   . H/O tricuspid valve repair 2012  . History of echocardiogram    Echo at [facility] in [facility] 4/12: mod LVH, EF 60-65%, mod LAE, tissue MVR ok, mod TR, mod pulmo HTN with RVSP 48 mmHg  . Hypertension   . Obstructive sleep apnea   . Paroxysmal atrial fibrillation (HCC)    s/p Maze procedure at time of MVR in 2011  . Pulmonary HTN ([place])   . S/P mitral valve replacement with porcine valve 2012    Assessment: Pt on warfarin for afib/MVR (bioprosthetic) and holding therapy. Pharmacy consulted to dose heparin  -INR= 2.45  Goal of Therapy:  INR 2-3 Monitor platelets by anticoagulation protocol: Yes   Plan:  -Begin heparin when INR < 2.0 -Daily PT/INR and CBC  Hildred Laser, Pharm D 06/26/2017 6:40 PM

## 2017-06-26 NOTE — Consult Note (Signed)
Cardiology Consultation:   Patient ID: Michael [name]; [id number]; 1961/04/19   Admit date: 06/25/2017 Date of Consult: 06/26/2017  Primary Care Provider: [name], MD Primary Cardiologist: Dr. Harrington Challenger    Patient Profile:   Michael [name] is a 56 y.o. male with a hx of chronic systolic CHF, paroxysmal atrial fibrillation, CKD IV, coronary artery disease (per charts no imaging noted), diabetes, GERD, h/o tricupsid valve repair, hypertension, OSA, s/p mitral valve replacement with porcine valve.   who is being seen today for the evaluation of acute on chronic CHF exacerbation at the request of Dr. Algis Liming.  History of Present Illness:   [name] has multiple medical problems. His last saw his cardiologist, Dr. Harrington Challenger April 30,2018. His volume was up on that exam so she ordered an echocardiogram  EF 35-40% (5/23) it was repeated again a few months later (7/16) and his EF had recovered  50-55%.   He presented to the ER yesterday because despite taking his lasix 80 mg BID he was retaining fluid with significant SOB at rest and with even worse exertion and total body swelling. He believes his dry weight to be 185 and he is 201 lbs today on admission. In the ER his BNP was 875. Chest xray with pulmonary vascular congestion, trace pleural effusions and cardiomegaly. His EKG reads as atrial fibrillation (rate controlled) though he appears to be in sinus right now-- not current on telemetry. Creatinine is 3- 2.8, baseline around 2, BUN 55, hemoglobin is 7.8 (unclear why). Albumin mildly low at 3.0.  He is currently on Coreg 25mg  BID,  lasix 80 mg BID and coumadin at home (mechanical valves and pAF). This admission he has received two doses of 80 mg IV lasix and is diuresing very little with not much improvement in symptoms or decrease in weight. He is in NAD and there are no signs of infection. He has not been having any chest pains.   Past Medical History:  Diagnosis Date  .  Anemia of chronic disease   . Asthma    said he does not know  . Chronic kidney disease (CKD), stage IV (severe) ([place])   . Chronic systolic CHF (congestive heart failure) (HCC)    a. prior EF normal; b. Echo 10/16: Mild LVH, EF 30-35%, mitral valve bioprosthesis present without evidence of stenosis or regurgitation, severe LAE, mild RVE with mild to moderately reduced RVSF, moderate RAE  . Coronary artery disease   . Diabetes mellitus type 2 in obese ([place])   . Diabetes mellitus without complication ([place])   . Dyspnea   . GERD (gastroesophageal reflux disease)   . H/O tricuspid valve repair 2012  . History of echocardiogram    Echo at [facility] 4/12: mod LVH, EF 60-65%, mod LAE, tissue MVR ok, mod TR, mod pulmo HTN with RVSP 48 mmHg  . Hypertension   . Obstructive sleep apnea   . Paroxysmal atrial fibrillation (HCC)    s/p Maze procedure at time of MVR in 2011  . Pulmonary HTN ([place])   . 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 CARDIAC SURGERY       Inpatient Medications: Scheduled Meds: . atorvastatin  20 mg Oral Daily  . carvedilol  25 mg Oral BID WC  . ferrous sulfate  325 mg Oral BID WC  . folic acid  1 mg Oral Daily  . furosemide  80 mg Intravenous BID  .  insulin aspart  0-5 Units Subcutaneous QHS  . insulin aspart  0-9 Units Subcutaneous TID WC  . insulin detemir  10 Units Subcutaneous Q2200  . pantoprazole  40 mg Oral Daily  . warfarin  2.5 mg Oral q1800  . Warfarin - Pharmacist Dosing Inpatient   Does not apply q1800   Continuous Infusions:  PRN Meds: traMADol  Allergies:   No Known Allergies  Social History:   Social History   Social History  . Marital status: Single    Spouse name: N/A  . Number of children: N/A  . Years of education: N/A   Occupational History  .      on disability   Social History Main Topics  . Smoking status: Current Some Day Smoker    Packs/day: 1.00     Types: Cigarettes  . Smokeless tobacco: Former Systems developer     Comment: said he is not a regular smoker  . Alcohol use No  . Drug use: No  . Sexual activity: Not Currently   Other Topics Concern  . Not on file   Social History Narrative  . No narrative on file    Family History:  Reports no known family history because they do not have hospitals in [place] Family History  Problem Relation Age of Onset  . Heart attack Neg Hx      ROS:  Please see the history of present illness.  ROS  All other ROS reviewed and negative.     Physical Exam/Data:   Vitals:   06/25/17 2007 06/25/17 2130 06/26/17 0428 06/26/17 0900  BP: 115/83  (!) 109/50 105/66  Pulse: 77  (!) 115 96  Resp: (!) 21  19 18   Temp: 98.7 F (37.1 C)  98.4 F (36.9 C) 98.4 F (36.9 C)  TempSrc: Oral  Oral Oral  SpO2: 98%  100% 98%  Weight: 201 lb 1 oz (91.2 kg)     Height: 5\' 3"  (1.6 m) 5\' 5"  (1.651 m)      Intake/Output Summary (Last 24 hours) at 06/26/17 1628 Last data filed at 06/26/17 1300  Gross per 24 hour  Intake              822 ml  Output              840 ml  Net              -18 ml   Filed Weights   06/25/17 2007  Weight: 201 lb 1 oz (91.2 kg)   Body mass index is 33.46 kg/m.  General:  Well nourished, well developed, in no acute distress HEENT: normal Lymph: no adenopathy Neck: +JVD Endocrine:  No thryomegaly Vascular: No carotid bruits; FA pulses 2+ bilaterally without bruits  Cardiac:  normal S1, S2; RRR; no murmur, distant heart sounds Lungs:  clear to auscultation bilaterally, no wheezing, rhonchi or rales  Abd: distended, diffusely tender and firm. No guarding. Ext: + lower and upper extremity edema/ anasarca Musculoskeletal:  No deformities, BUE and BLE strength normal and equal Skin: warm and dry  Neuro:  CNs 2-12 intact, no focal abnormalities noted Psych:  Normal affect   EKG:  The EKG was personally reviewed and demonstrates:  Atrial fibrillation   Relevant CV  Studies: Echocardiogram 06/01/2017  Study Conclusions  - Left ventricle: The cavity size was normal. There was mild   concentric hypertrophy. Systolic function was normal. The   estimated ejection fraction was in the range of 50% to 55%.  Wall   motion was normal; there were no regional wall motion   abnormalities. - Ventricular septum: The contour showed diastolic flattening. - Aortic valve: Transvalvular velocity was minimally increased.   There was no stenosis. There was mild regurgitation. Peak   velocity (S): 201 cm/s. - Mitral valve: A bioprosthesis was present and functioning   normally. Mean gradient (D): 6 mm Hg. Valve area by pressure   half-time: 1.93 cm^2. - Left atrium: The atrium was severely dilated. - Right ventricle: The cavity size was moderately dilated. Wall   thickness was normal. Systolic function was moderately reduced. - Right atrium: The atrium was moderately dilated. - Tricuspid valve: There was severe regurgitation. - Pulmonary arteries: Systolic pressure was moderately increased.   PA peak pressure: 54 mm Hg (S).  Laboratory Data:  Chemistry Recent Labs Lab 06/25/17 1300 06/26/17 0422  NA 137 138  K 3.8 3.7  CL 103 103  CO2 25 26  GLUCOSE 145* 127*  BUN 55* 55*  CREATININE 3.02* 2.83*  CALCIUM 8.6* 8.5*  GFRNONAA 21* 23*  GFRAA 25* 27*  ANIONGAP 9 9     Recent Labs Lab 06/25/17 1252  PROT 7.2  ALBUMIN 3.0*  AST 31  ALT 13*  ALKPHOS 113  BILITOT 1.8*   Hematology Recent Labs Lab 06/25/17 1300 06/26/17 0422  WBC 4.2 3.6*  RBC 3.27* 3.17*  HGB 8.0* 7.8*  HCT 24.7* 23.9*  MCV 75.5* 75.4*  MCH 24.5* 24.6*  MCHC 32.4 32.6  RDW 19.2* 19.2*  PLT 177 181   Cardiac EnzymesNo results for input(s): TROPONINI in the last 168 hours.  Recent Labs Lab 06/25/17 1322  TROPIPOC 0.01    BNP Recent Labs Lab 06/25/17 1253  BNP 875.7*    DDimer No results for input(s): DDIMER in the last 168 hours.  Radiology/Studies:  Dg Chest  2 View  Result Date: 06/25/2017 CLINICAL DATA:  Acute shortness of breath for 2 days. EXAM: CHEST  2 VIEW COMPARISON:  05/31/2017 and prior radiographs FINDINGS: Cardiomegaly and median sternotomy again noted. Mild pulmonary vascular congestion and bibasilar opacities are again noted. Trace bilateral pleural effusions are present. There is no evidence of pneumothorax or acute bony abnormality. IMPRESSION: Cardiomegaly with mild pulmonary vascular congestion. Unchanged bibasilar opacities which may represent atelectasis/scarring/airspace disease. Trace bilateral pleural effusions. Electronically Signed   By: Margarette Canada M.D.   On: 06/25/2017 13:37   US Renal  Result Date: 06/25/2017 CLINICAL DATA:  Subacute onset of renal insufficiency. Initial encounter. EXAM: RENAL / URINARY TRACT ULTRASOUND COMPLETE COMPARISON:  CT of the abdomen and pelvis performed 05/30/2017 FINDINGS: Right Kidney: Length: 10.3 cm. Increased parenchymal echogenicity is noted. No mass or hydronephrosis visualized. Left Kidney: Length: 9.9 cm. Increased parenchymal echogenicity is noted. A large 1.8 cm stone is noted at the interpole region of the left kidney. No mass or hydronephrosis visualized. Bladder: Appears normal for degree of bladder distention. Small to moderate volume ascites is noted within the abdomen. Small bilateral pleural effusions are seen. IMPRESSION: 1. No evidence of hydronephrosis. 2. Increased renal parenchymal echogenicity raises concern for medical renal disease. 3. Large 1.8 cm nonobstructing stone again noted at the left kidney. 4. Small to moderate volume ascites within the abdomen. 5. Small bilateral pleural effusions seen. Electronically Signed   By: Garald Balding M.D.   On: 06/25/2017 23:05    Assessment and Plan:   1. Acute on chronic systolic heart failure: patient is extremely fluid overloaded, he is not responding to IV  lasix 80 mg BID. Increase to 120 mg BID IV and will add low dose milrinone for  pharmacy to dose. -- he already has kidney disease, we will need to watch this very closely.  -- strict I/O's and daily weights -- estimated dry weight is 185; currently 201. -- continue Coreg, consider titrating this up.  2. Acute on chronic Stage IV CKD: avoid ACE/ARB due to kidney disease. BUN 55 and sCr is 2.83 today (baseline around 2)  3. Paroxysmal atrial fibrillation: rate controlled, his EKG looks like afib, he has been symptomatic with SOB, this may be partially due to afib and the afib may be exacerbating his CHF as well. He is not on a telemetry monitor now. I will order this so that we can evaluation how much afib he is having.  4. OSA: questionable if he is complaint  5. Diabetes: A1C on 05/30/2017 was 5.5, medicine to manage.  6. Hx of Triscupid repair and Mitral valve replacement: Tricupsid valve shows severe regurg on most recent echo. Normal Mitral valve  7. Hypertension: Currently mildly elevated. Will not adjust meds and follow as he is diuresed and as we potentially consider increasing his beta blocker.  8. Low albumin: 3.0  Signed, Linus Mako, PA-C  06/26/2017 @ As above, patient seen and examined. Briefly he is a 56 year old male with past medical history of chronic combined systolic/diastolic congestive heart failure, prior mitral valve replacement with porcine valve, history of tricuspid valve repair, persistent atrial fibrillation, chronic stage IV kidney disease, diabetes mellitus, gastric reflux disease, obstructive sleep apnea who I am asked to evaluate for acute on chronic combined systolic/diastolic congestive heart failure. Patient underwent mitral valve replacement and tricuspid valve repair in [place] in 2012. Last echocardiogram July 2018 showed low normal LV systolic function, mild aortic insufficiency, bioprosthetic aortic valve with mean gradient 6 mmHg, biatrial enlargement, moderate right ventricular enlargement with moderately reduced function,  severe tricuspid regurgitation and moderately elevated pulmonary pressure. Patient states that for the past 2 weeks he has had worsening dyspnea. He also complains of weight gain, swelling in his lower extremities and abdomen. He denies chest pain, fevers. He was admitted and cardiology now asked to evaluate. Physical exam is significant for massive volume overload. He has presacral edema, ascites and 3-4+ lower extremity edema. Hemoglobin 7.8. Electrocardiogram shows atrial fibrillation, incomplete right bundle branch block and nonspecific ST-T wave changes. BNP 875.  1 acute on chronic combined systolic/diastolic congestive heart failure-patient is markedly volume overloaded. This may be multifactorial. This would include right heart failure, pulmonary venous hypertension, mildly decreased albumin and worsening renal function. I will increase his Lasix to 120 mg IV twice a day. I will add low-dose milrinone to assist with RV function. Follow renal function closely. He will likely ultimately require right heart catheterization.  2 acute on chronic stage IV kidney disease-patient will need renal function monitored closely with diuresis. He may need nephrology consult.  3 persistent atrial fibrillation-patient appears to have been in atrial fibrillation for over one year and I therefore doubt this is contributing to his heart failure. I will continue carvedilol for rate control. Hold Coumadin in anticipation of right heart catheterization. Add heparin when INR less than 2.  4 valvular heart disease-recent echocardiogram showed mitral valve replacement functioning well. He has had previous tricuspid valve repair and there severe tricuspid regurgitation. History could also be contributing to his volume excess.  Kirk Ruths, MD

## 2017-06-26 NOTE — Progress Notes (Signed)
Pt reported to [facility] and fresh vitals obtained and recorded. Pt will be transporting to 2c06 via wheelchair. He had an output of 42ml in the urinal at this time

## 2017-06-26 NOTE — Care Management Obs Status (Signed)
[place] NOTIFICATION   Patient Details  Name: Michael [name] MRN: [id number] Date of Birth: 08/04/1961   Medicare Observation Status Notification Given:  Yes    Keymoni Mccaster, Rory Percy, RN 06/26/2017, 11:35 AM

## 2017-06-26 NOTE — Progress Notes (Addendum)
PROGRESS NOTE   Michael [name]  TSV:[id number]    DOB: Jul 09, 1961    DOA: 06/25/2017  PCP: [name], MD   I have briefly reviewed patients previous medical records in [facility].  Brief Narrative:  56 year old male with a PMH of type II DM, HTN, HLD, COPD, CAD, pulmonary hypertension, OSA, DM, mitral valve replacement with porcine valve, tricuspid valve repair, paroxysmal A. fib on Coumadin anticoagulation, chronic systolic CHF but now with improved EF, stage IV chronic kidney disease, cirrhosis, gout, recent hospitalization 05/30/17-06/02/17 for decompensated CHF and anasarca, seen by PCP on day of admission for worsening dyspnea, leg edema despite reported compliance with Lasix 80 mg twice a day but ongoing salt intake and gained 20 pounds in the last 3 weeks, refer to ED for admission.   Assessment & Plan:   Principal Problem:   Acute kidney injury ([place]) Active Problems:   CKD (chronic kidney disease), stage IV (HCC)   DM type 2 (diabetes mellitus, type 2) (HCC)   Essential hypertension   Chronic systolic CHF (congestive heart failure) (HCC)   Gout   Chronic atrial fibrillation (HCC)   Anasarca   1. Acute on chronic diastolic CHF: 2-D echo 08/09/29 showed EF 35-40 percent but repeat echo 06/01/17 showed EF 50-55 percent, PA pressure 55 mmHg. Recent hospitalization for decompensated CHF at which time cardiology had consulted. Likely precipitated by dietary indiscretion. Also complicated by underlying cirrhosis. Initiated Lasix 80 mg every 12 hourly. Counseled extensively regarding all aspects of CHF care including salt restriction. He verbalized understanding. Daily weights, strict intake output. Consider cardiology consultation. 2. Anasarca: Secondary to decompensated CHF and cirrhosis. Management as above. 3. Paroxysmal A. Fib: Currently not on telemetry, will place on telemetry. Seems to be in sinus rhythm. Continue carvedilol and Coumadin per pharmacy. Therapeutically  anticoagulated. 4. Acute on Stage IV chronic kidney disease: Creatinine at recent discharge was 2.17. Baseline creatinine probably in the 2.2-2.3 range. Presented with creatinine of 3.02. Likely related to poor perfusion from CHF. Initiated high-dose IV Lasix. Improved to 2.83. Monitor creatinine closely. No hydronephrosis on renal ultrasound. 5. Type II DM with renal complications: Q7M 5.5. CBGs well-controlled in the hospital on Levemir and SSI. 6. Essential hypertension: Controlled. 7. Cirrhosis: Fluid management as above. Some ascites but no urgent need for paracentesis. Outpatient follow-up with [facility]. 8. Hyperlipidemia: Statins. 9. Anemia of chronic disease: Stable. Follow CBCs closely. 10. GERD: PPI. 11. History of gout: No acute flare at this time. 12. Microcytic anemia/iron deficiency: Serum iron 28, ferritin 38 in April 2017. Iron supplements. Follow CBCs closely and transfuse if hemoglobin less than 7 g per DL. No overt bleeding.   DVT prophylaxis: Anticoagulated on Coumadin per pharmacy. Code Status: Full Family Communication: None at bedside Disposition: DC home when medically stable   Consultants:  Cardiology   Procedures:  None  Antimicrobials:  None    Subjective: States that he feels much better than on admission. Decreased swelling around his belly and legs. Breathing improved and denies dyspnea. No abdominal pain reported. Having BMs. No nausea or vomiting. Tolerating diet. No chest pain.   ROS: No dizziness or lightheadedness or palpitations.  Objective:  Vitals:   06/25/17 2007 06/25/17 2130 06/26/17 0428 06/26/17 0900  BP: 115/83  (!) 109/50 105/66  Pulse: 77  (!) 115 96  Resp: (!) 21  19 18   Temp: 98.7 F (37.1 C)  98.4 F (36.9 C) 98.4 F (36.9 C)  TempSrc: Oral  Oral Oral  SpO2:  98%  100% 98%  Weight: 91.2 kg (201 lb 1 oz)     Height: 5\' 3"  (1.6 m) 5\' 5"  (1.651 m)      Examination:  General exam: Pleasant middle-aged male sitting up  comfortably in bed. Respiratory system: Reduced breath sounds in the bases with few bibasal crackles. Rest of lung fields clear to auscultation. Respiratory effort normal. Cardiovascular system: S1 & S2 heard, RRR. No JVD, murmurs, rubs, gallops or clicks. 2+ pitting bilateral leg edema extending to the anterior abdominal wall anteriorly and mid back posteriorly. Gastrointestinal system: Abdomen is mildly distended, soft and nontender. No organomegaly or masses felt. Normal bowel sounds heard. Ascites + Central nervous system: Alert and oriented. No focal neurological deficits. Extremities: Symmetric 5 x 5 power. Skin: No rashes, lesions or ulcers Psychiatry: Judgement and insight appear normal. Mood & affect appropriate.     Data Reviewed: I have personally reviewed following labs and imaging studies  CBC:  Recent Labs Lab 06/25/17 1300 06/26/17 0422  WBC 4.2 3.6*  HGB 8.0* 7.8*  HCT 24.7* 23.9*  MCV 75.5* 75.4*  PLT 177 245   Basic Metabolic Panel:  Recent Labs Lab 06/25/17 1300 06/26/17 0422  NA 137 138  K 3.8 3.7  CL 103 103  CO2 25 26  GLUCOSE 145* 127*  BUN 55* 55*  CREATININE 3.02* 2.83*  CALCIUM 8.6* 8.5*   Liver Function Tests:  Recent Labs Lab 06/25/17 1252  AST 31  ALT 13*  ALKPHOS 113  BILITOT 1.8*  PROT 7.2  ALBUMIN 3.0*   Coagulation Profile:  Recent Labs Lab 06/25/17 1252 06/26/17 0422  INR 2.44 2.45   Cardiac Enzymes: No results for input(s): CKTOTAL, CKMB, CKMBINDEX, TROPONINI in the last 168 hours. HbA1C: No results for input(s): HGBA1C in the last 72 hours. CBG:  Recent Labs Lab 06/25/17 2332 06/26/17 0739 06/26/17 1129  GLUCAP 102* 85 134*    No results found for this or any previous visit (from the past 240 hour(s)).       Radiology Studies: Dg Chest 2 View  Result Date: 06/25/2017 CLINICAL DATA:  Acute shortness of breath for 2 days. EXAM: CHEST  2 VIEW COMPARISON:  05/31/2017 and prior radiographs FINDINGS:  Cardiomegaly and median sternotomy again noted. Mild pulmonary vascular congestion and bibasilar opacities are again noted. Trace bilateral pleural effusions are present. There is no evidence of pneumothorax or acute bony abnormality. IMPRESSION: Cardiomegaly with mild pulmonary vascular congestion. Unchanged bibasilar opacities which may represent atelectasis/scarring/airspace disease. Trace bilateral pleural effusions. Electronically Signed   By: Margarette Canada M.D.   On: 06/25/2017 13:37   US Renal  Result Date: 06/25/2017 CLINICAL DATA:  Subacute onset of renal insufficiency. Initial encounter. EXAM: RENAL / URINARY TRACT ULTRASOUND COMPLETE COMPARISON:  CT of the abdomen and pelvis performed 05/30/2017 FINDINGS: Right Kidney: Length: 10.3 cm. Increased parenchymal echogenicity is noted. No mass or hydronephrosis visualized. Left Kidney: Length: 9.9 cm. Increased parenchymal echogenicity is noted. A large 1.8 cm stone is noted at the interpole region of the left kidney. No mass or hydronephrosis visualized. Bladder: Appears normal for degree of bladder distention. Small to moderate volume ascites is noted within the abdomen. Small bilateral pleural effusions are seen. IMPRESSION: 1. No evidence of hydronephrosis. 2. Increased renal parenchymal echogenicity raises concern for medical renal disease. 3. Large 1.8 cm nonobstructing stone again noted at the left kidney. 4. Small to moderate volume ascites within the abdomen. 5. Small bilateral pleural effusions seen. Electronically Signed  By: Garald Balding M.D.   On: 06/25/2017 23:05        Scheduled Meds: . atorvastatin  20 mg Oral Daily  . carvedilol  25 mg Oral BID WC  . folic acid  1 mg Oral Daily  . furosemide  80 mg Intravenous BID  . insulin aspart  0-5 Units Subcutaneous QHS  . insulin aspart  0-9 Units Subcutaneous TID WC  . insulin detemir  10 Units Subcutaneous Q2200  . pantoprazole  40 mg Oral Daily  . warfarin  2.5 mg Oral q1800  .  Warfarin - Pharmacist Dosing Inpatient   Does not apply q1800   Continuous Infusions:   LOS: 0 days     Sheela Mcculley, MD, FACP, FHM. [facility] Pager [phone]  If 7PM-7AM, please contact night-coverage [email or web address] Password [email or web address] 06/26/2017, 11:47 AM

## 2017-06-26 NOTE — Progress Notes (Addendum)
Patient needed to be transferred to cardiac tele or SDU bed for milrinone drip. Patient was sleepy at first when I assesed him, + 3 edema in legs, + 2 edema in arms, + anasarca, patient did arouse and was alert, RN explained to him that he is being transferred to another unit since they are starting him on milrinone.  VS 105/73 (81), HR 110, RR 16, 93% RA  No acute RRT needs or interventions, 6E instructed to call report once bed is pended and transfer patient.

## 2017-06-26 NOTE — Progress Notes (Signed)
Report received from [facility] at the bedside and pt has been in bed with eyes closed and has an order for transfer to cardiac floor. Call had been made to the nurse for reporting. Awaiting to call back dt staff is transferring another pt.

## 2017-06-26 NOTE — Consult Note (Addendum)
   [facility] CM Inpatient Consult   06/26/2017  Zebulun Dieckman 07/26/1961 [id number]    [facility] Care Management follow up.   Went to bedside to speak with Mr. Thoman. Discussed that his Primary Care MD, Dr. Jarold Song, wants [place] [facility] to follow him post discharge for disease management. Explained [place] [facility] program services. He is agreeable and written consent was obtained. [facility] Care Management packet provided.  Mr. Bethel reports that he has been receiving automated calls that he does not like. Requests that he stops receiving them. Discussed that he will receive post hospital discharge call from [facility] and will potentially receive home visits for disease and symptom management for CHF and DM. He is agreeable to this.   Made him aware that [place] [facility] services will not interfere or replace services provided by home health. He has been active with [facility] prior to admission.  States he has a scale and weighs daily. Reports he receives his medications from the [facility] and [facility]. Confirms he lives with a friend. Confirms best contact number as [phone]  Spoke with Opal Sidles with [facility] to discuss above. She reiterates that Dr. Jarold Song wants [place] [facility] to work with patient for medication compliance and disease management and education. Wants [facility] to work closely with home health for Mr. Colman Cater management. Opal Sidles also reports that patient has a daughter that lives nearby who assist patient with meds. Daughter also listed on [place] [facility] consentNada Libman [phone].   Discussed above with inpatient RNCM who also confirmed that [facility] is still following patient.  Request made for EMMI calls to stop with [place] Management office.   Referral to be made for [facility] for transition of care. History of CHF, CKD, DM, HTN, AFIB. He is in the hospital for anasarca.   [name], MSN-Ed,  RN,BSN [facility] Liaison [phone]

## 2017-06-26 NOTE — Progress Notes (Signed)
Pt in the process of trransfer to second floor with the nurse.

## 2017-06-26 NOTE — Progress Notes (Signed)
Bedside reporting done with [facility]

## 2017-06-26 NOTE — Telephone Encounter (Signed)
Call received from [facility], RN/[facility] confirming that the patient has agreed to [facility] care management.

## 2017-06-27 ENCOUNTER — Encounter (HOSPITAL_COMMUNITY): Payer: Self-pay | Admitting: *Deleted

## 2017-06-27 DIAGNOSIS — E1165 Type 2 diabetes mellitus with hyperglycemia: Secondary | ICD-10-CM | POA: Diagnosis present

## 2017-06-27 DIAGNOSIS — D696 Thrombocytopenia, unspecified: Secondary | ICD-10-CM | POA: Diagnosis not present

## 2017-06-27 DIAGNOSIS — D122 Benign neoplasm of ascending colon: Secondary | ICD-10-CM | POA: Diagnosis not present

## 2017-06-27 DIAGNOSIS — E785 Hyperlipidemia, unspecified: Secondary | ICD-10-CM | POA: Diagnosis present

## 2017-06-27 DIAGNOSIS — K7460 Unspecified cirrhosis of liver: Secondary | ICD-10-CM | POA: Diagnosis not present

## 2017-06-27 DIAGNOSIS — I959 Hypotension, unspecified: Secondary | ICD-10-CM | POA: Diagnosis not present

## 2017-06-27 DIAGNOSIS — I4510 Unspecified right bundle-branch block: Secondary | ICD-10-CM | POA: Diagnosis present

## 2017-06-27 DIAGNOSIS — I481 Persistent atrial fibrillation: Secondary | ICD-10-CM | POA: Diagnosis present

## 2017-06-27 DIAGNOSIS — E1122 Type 2 diabetes mellitus with diabetic chronic kidney disease: Secondary | ICD-10-CM | POA: Diagnosis present

## 2017-06-27 DIAGNOSIS — Z4682 Encounter for fitting and adjustment of non-vascular catheter: Secondary | ICD-10-CM | POA: Diagnosis not present

## 2017-06-27 DIAGNOSIS — D631 Anemia in chronic kidney disease: Secondary | ICD-10-CM | POA: Diagnosis present

## 2017-06-27 DIAGNOSIS — I5043 Acute on chronic combined systolic (congestive) and diastolic (congestive) heart failure: Secondary | ICD-10-CM | POA: Diagnosis not present

## 2017-06-27 DIAGNOSIS — I351 Nonrheumatic aortic (valve) insufficiency: Secondary | ICD-10-CM | POA: Diagnosis not present

## 2017-06-27 DIAGNOSIS — I953 Hypotension of hemodialysis: Secondary | ICD-10-CM | POA: Diagnosis not present

## 2017-06-27 DIAGNOSIS — N200 Calculus of kidney: Secondary | ICD-10-CM | POA: Diagnosis not present

## 2017-06-27 DIAGNOSIS — R601 Generalized edema: Secondary | ICD-10-CM | POA: Diagnosis not present

## 2017-06-27 DIAGNOSIS — I5033 Acute on chronic diastolic (congestive) heart failure: Secondary | ICD-10-CM | POA: Diagnosis present

## 2017-06-27 DIAGNOSIS — Z7189 Other specified counseling: Secondary | ICD-10-CM | POA: Diagnosis not present

## 2017-06-27 DIAGNOSIS — M109 Gout, unspecified: Secondary | ICD-10-CM | POA: Diagnosis not present

## 2017-06-27 DIAGNOSIS — I4891 Unspecified atrial fibrillation: Secondary | ICD-10-CM | POA: Diagnosis not present

## 2017-06-27 DIAGNOSIS — D509 Iron deficiency anemia, unspecified: Secondary | ICD-10-CM | POA: Diagnosis present

## 2017-06-27 DIAGNOSIS — D6832 Hemorrhagic disorder due to extrinsic circulating anticoagulants: Secondary | ICD-10-CM | POA: Diagnosis not present

## 2017-06-27 DIAGNOSIS — E861 Hypovolemia: Secondary | ICD-10-CM | POA: Diagnosis present

## 2017-06-27 DIAGNOSIS — Z515 Encounter for palliative care: Secondary | ICD-10-CM | POA: Diagnosis not present

## 2017-06-27 DIAGNOSIS — I420 Dilated cardiomyopathy: Secondary | ICD-10-CM | POA: Diagnosis not present

## 2017-06-27 DIAGNOSIS — J449 Chronic obstructive pulmonary disease, unspecified: Secondary | ICD-10-CM | POA: Diagnosis present

## 2017-06-27 DIAGNOSIS — D899 Disorder involving the immune mechanism, unspecified: Secondary | ICD-10-CM | POA: Diagnosis present

## 2017-06-27 DIAGNOSIS — I482 Chronic atrial fibrillation: Secondary | ICD-10-CM | POA: Diagnosis not present

## 2017-06-27 DIAGNOSIS — Z7901 Long term (current) use of anticoagulants: Secondary | ICD-10-CM | POA: Diagnosis not present

## 2017-06-27 DIAGNOSIS — M1000 Idiopathic gout, unspecified site: Secondary | ICD-10-CM | POA: Diagnosis not present

## 2017-06-27 DIAGNOSIS — M7989 Other specified soft tissue disorders: Secondary | ICD-10-CM | POA: Diagnosis not present

## 2017-06-27 DIAGNOSIS — I10 Essential (primary) hypertension: Secondary | ICD-10-CM | POA: Diagnosis not present

## 2017-06-27 DIAGNOSIS — J9811 Atelectasis: Secondary | ICD-10-CM | POA: Diagnosis not present

## 2017-06-27 DIAGNOSIS — M79609 Pain in unspecified limb: Secondary | ICD-10-CM | POA: Diagnosis not present

## 2017-06-27 DIAGNOSIS — Z952 Presence of prosthetic heart valve: Secondary | ICD-10-CM | POA: Diagnosis not present

## 2017-06-27 DIAGNOSIS — I071 Rheumatic tricuspid insufficiency: Secondary | ICD-10-CM | POA: Diagnosis present

## 2017-06-27 DIAGNOSIS — N185 Chronic kidney disease, stage 5: Secondary | ICD-10-CM | POA: Diagnosis not present

## 2017-06-27 DIAGNOSIS — N179 Acute kidney failure, unspecified: Secondary | ICD-10-CM | POA: Diagnosis not present

## 2017-06-27 DIAGNOSIS — I132 Hypertensive heart and chronic kidney disease with heart failure and with stage 5 chronic kidney disease, or end stage renal disease: Secondary | ICD-10-CM | POA: Diagnosis present

## 2017-06-27 DIAGNOSIS — Z452 Encounter for adjustment and management of vascular access device: Secondary | ICD-10-CM | POA: Diagnosis not present

## 2017-06-27 DIAGNOSIS — R188 Other ascites: Secondary | ICD-10-CM | POA: Diagnosis present

## 2017-06-27 DIAGNOSIS — I509 Heart failure, unspecified: Secondary | ICD-10-CM | POA: Diagnosis not present

## 2017-06-27 DIAGNOSIS — G4733 Obstructive sleep apnea (adult) (pediatric): Secondary | ICD-10-CM | POA: Diagnosis present

## 2017-06-27 DIAGNOSIS — I5023 Acute on chronic systolic (congestive) heart failure: Secondary | ICD-10-CM | POA: Diagnosis not present

## 2017-06-27 DIAGNOSIS — K648 Other hemorrhoids: Secondary | ICD-10-CM | POA: Diagnosis not present

## 2017-06-27 DIAGNOSIS — Z01810 Encounter for preprocedural cardiovascular examination: Secondary | ICD-10-CM | POA: Diagnosis not present

## 2017-06-27 DIAGNOSIS — I2510 Atherosclerotic heart disease of native coronary artery without angina pectoris: Secondary | ICD-10-CM | POA: Diagnosis present

## 2017-06-27 DIAGNOSIS — N184 Chronic kidney disease, stage 4 (severe): Secondary | ICD-10-CM | POA: Diagnosis not present

## 2017-06-27 DIAGNOSIS — N186 End stage renal disease: Secondary | ICD-10-CM | POA: Diagnosis present

## 2017-06-27 DIAGNOSIS — I517 Cardiomegaly: Secondary | ICD-10-CM | POA: Diagnosis not present

## 2017-06-27 DIAGNOSIS — K649 Unspecified hemorrhoids: Secondary | ICD-10-CM | POA: Diagnosis not present

## 2017-06-27 DIAGNOSIS — Z992 Dependence on renal dialysis: Secondary | ICD-10-CM | POA: Diagnosis not present

## 2017-06-27 DIAGNOSIS — K625 Hemorrhage of anus and rectum: Secondary | ICD-10-CM | POA: Diagnosis not present

## 2017-06-27 DIAGNOSIS — K219 Gastro-esophageal reflux disease without esophagitis: Secondary | ICD-10-CM | POA: Diagnosis present

## 2017-06-27 LAB — PROTIME-INR
INR: 2.74
PROTHROMBIN TIME: 29.6 s — AB (ref 11.4–15.2)

## 2017-06-27 LAB — VITAMIN B12: Vitamin B-12: 934 pg/mL — ABNORMAL HIGH (ref 180–914)

## 2017-06-27 LAB — BASIC METABOLIC PANEL
Anion gap: 9 (ref 5–15)
BUN: 52 mg/dL — AB (ref 6–20)
CALCIUM: 8.3 mg/dL — AB (ref 8.9–10.3)
CHLORIDE: 103 mmol/L (ref 101–111)
CO2: 26 mmol/L (ref 22–32)
CREATININE: 2.72 mg/dL — AB (ref 0.61–1.24)
GFR calc Af Amer: 28 mL/min — ABNORMAL LOW (ref >60)
GFR calc non Af Amer: 24 mL/min — ABNORMAL LOW (ref >60)
Glucose, Bld: 139 mg/dL — ABNORMAL HIGH (ref 65–99)
Potassium: 3.8 mmol/L (ref 3.5–5.1)
SODIUM: 138 mmol/L (ref 135–145)

## 2017-06-27 LAB — CBC
HEMATOCRIT: 22.4 % — AB (ref 39.0–52.0)
Hemoglobin: 7.5 g/dL — ABNORMAL LOW (ref 13.0–17.0)
MCH: 25.2 pg — ABNORMAL LOW (ref 26.0–34.0)
MCHC: 33.5 g/dL (ref 30.0–36.0)
MCV: 75.2 fL — AB (ref 78.0–100.0)
PLATELETS: 173 10*3/uL (ref 150–400)
RBC: 2.98 MIL/uL — ABNORMAL LOW (ref 4.22–5.81)
RDW: 19.5 % — AB (ref 11.5–15.5)
WBC: 3.6 10*3/uL — ABNORMAL LOW (ref 4.0–10.5)

## 2017-06-27 LAB — IRON AND TIBC
Iron: 113 ug/dL (ref 45–182)
SATURATION RATIOS: 24 % (ref 17.9–39.5)
TIBC: 473 ug/dL — ABNORMAL HIGH (ref 250–450)
UIBC: 360 ug/dL

## 2017-06-27 LAB — MAGNESIUM: MAGNESIUM: 2.9 mg/dL — AB (ref 1.7–2.4)

## 2017-06-27 LAB — GLUCOSE, CAPILLARY
Glucose-Capillary: 121 mg/dL — ABNORMAL HIGH (ref 65–99)
Glucose-Capillary: 132 mg/dL — ABNORMAL HIGH (ref 65–99)
Glucose-Capillary: 135 mg/dL — ABNORMAL HIGH (ref 65–99)
Glucose-Capillary: 158 mg/dL — ABNORMAL HIGH (ref 65–99)

## 2017-06-27 LAB — MRSA PCR SCREENING: MRSA by PCR: NEGATIVE

## 2017-06-27 LAB — RETICULOCYTES
RBC.: 2.99 MIL/uL — AB (ref 4.22–5.81)
RETIC CT PCT: 1.6 % (ref 0.4–3.1)
Retic Count, Absolute: 47.8 10*3/uL (ref 19.0–186.0)

## 2017-06-27 LAB — FERRITIN: FERRITIN: 31 ng/mL (ref 24–336)

## 2017-06-27 LAB — FOLATE: Folate: 44.8 ng/mL (ref >5.9)

## 2017-06-27 MED ORDER — CARVEDILOL 12.5 MG PO TABS
12.5000 mg | ORAL_TABLET | Freq: Two times a day (BID) | ORAL | Status: DC
  Administered 2017-06-27 – 2017-06-28 (×2): 12.5 mg via ORAL
  Filled 2017-06-27 (×2): qty 1

## 2017-06-27 MED ORDER — FUROSEMIDE 10 MG/ML IJ SOLN
120.0000 mg | Freq: Three times a day (TID) | INTRAVENOUS | Status: DC
  Administered 2017-06-27 – 2017-06-28 (×5): 120 mg via INTRAVENOUS
  Filled 2017-06-27 (×6): qty 12
  Filled 2017-06-27: qty 10
  Filled 2017-06-27: qty 12

## 2017-06-27 MED ORDER — METOLAZONE 5 MG PO TABS
5.0000 mg | ORAL_TABLET | Freq: Every day | ORAL | Status: DC
  Administered 2017-06-27: 5 mg via ORAL
  Filled 2017-06-27 (×2): qty 1

## 2017-06-27 MED ORDER — ACETAMINOPHEN 325 MG PO TABS: 650.0000 mg | ORAL_TABLET | Freq: Four times a day (QID) | ORAL | Status: DC | PRN

## 2017-06-27 MED ORDER — AMIODARONE HCL 200 MG PO TABS
400.0000 mg | ORAL_TABLET | Freq: Every day | ORAL | Status: DC
  Administered 2017-06-27 – 2017-06-28 (×2): 400 mg via ORAL
  Filled 2017-06-27 (×2): qty 2

## 2017-06-27 MED ORDER — OXYCODONE HCL 5 MG PO TABS: 5.0000 mg | ORAL_TABLET | ORAL | Status: DC | PRN

## 2017-06-27 NOTE — Consult Note (Signed)
Renal Service Consult Note [facility]  Michael [name] 06/27/2017 [place] Requesting Physician:  Dr Algis Liming  Reason for Consult:  Renal failure HPI: The patient is a 56 y.o. year-old with hx of CKD stage IV, anemia, asthma, syst CHF, DM2, obesity, TV repair, HTN, OSA, paroxysmal afib, pulm HTN, sp MVR w porcine valve who presented to ED on 8/9 with wt gain, leg and abdominal swelling and SOB.  Creat was up from baseline in ED.  Recent admit for similar problems in July 2018. Due to marked edema and SOB pt was admitted.     Pt was started on IV lasix 80 bid, then seen by cardiology and lasix ^'d 120 tid and started on IV milrinone drip.  Had 1.7L UOP the 1st day and 2.5 L UOP yesterday.  Creat down from 3.0 on admission to 2.8 yest and 2.7 today.   Date   Creat  Other 2016  2.0- 4.0 AKI  3.3 > 2.1     AKI  4.0 > 2.2 2017   2.1- 4.6 AKI  4.6 > 1.9     AKI  2.9 > 2.15 Jan 2017 1.20 February 2017 1.24 Mar 2017 2.21 May 2017 2.4- 2.2     Baseline creat 1.8- 2.4 from late 2017 through July 2018.  AKI episodes back in 2016 and 2017 creat was up to 4 and then improved with diuresis.   ROS  denies CP  no joint pain   no HA  no blurry vision  no rash  no diarrhea  no nausea/ vomiting  no dysuria  no difficulty voiding  no change in urine color    Past Medical History  Past Medical History:  Diagnosis Date  . Anemia of chronic disease   . Asthma    said he does not know  . Chronic kidney disease (CKD), stage IV (severe) ([place])   . Chronic systolic CHF (congestive heart failure) (HCC)    a. prior EF normal; b. Echo 10/16: Mild LVH, EF 30-35%, mitral valve bioprosthesis present without evidence of stenosis or regurgitation, severe LAE, mild RVE with mild to moderately reduced RVSF, moderate RAE  . Coronary artery disease   . Diabetes mellitus type 2 in obese ([place])   . Diabetes mellitus without complication ([place])   . Dyspnea   . GERD (gastroesophageal  reflux disease)   . H/O tricuspid valve repair 2012  . History of echocardiogram    Echo at [facility] in [facility] 4/12: mod LVH, EF 60-65%, mod LAE, tissue MVR ok, mod TR, mod pulmo HTN with RVSP 48 mmHg  . Hypertension   . Obstructive sleep apnea   . Paroxysmal atrial fibrillation (HCC)    s/p Maze procedure at time of MVR in 2011  . Pulmonary HTN ([place])   . S/P mitral valve replacement with porcine valve 2012   Past Surgical History  Past Surgical History:  Procedure Laterality Date  . CARDIAC SURGERY     Family History  Family History  Problem Relation Age of Onset  . Heart attack Neg Hx    Social History  reports that he has been smoking Cigarettes.  He has been smoking about 1.00 pack per day. He has quit using smokeless tobacco. He reports that he does not drink alcohol or use drugs. Allergies No Known Allergies Home medications Prior to Admission medications   Medication Sig Start Date End Date Taking? Authorizing Provider  acetaminophen (TYLENOL) 650 MG CR tablet Take 650 mg by mouth  every 6 (six) hours as needed for pain.   Yes [provider]  carvedilol (COREG) 25 MG tablet Take 1 tablet (25 mg total) by mouth 2 (two) times daily with a meal. 06/02/17  Yes Mikhail, Santa [place], DO  colchicine 0.6 MG tablet Take 1.2 mg by mouth daily as needed for pain. 06/02/17  Yes [provider]  diclofenac sodium (VOLTAREN) 1 % GEL Apply 4 g topically 4 (four) times daily.   Yes [provider]  folic acid (FOLVITE) 1 MG tablet TAKE 1 TABLET BY MOUTH DAILY. 04/20/17  Yes [name], MD  furosemide (LASIX) 80 MG tablet Take 1 tablet (80 mg total) by mouth 2 (two) times daily. 12/12/16  Yes [name], MD  Insulin Detemir (LEVEMIR FLEXTOUCH) 100 UNIT/ML Pen Inject 10 Units into the skin daily at 10 pm. 10/16/16  Yes [name], MD  metoprolol tartrate (LOPRESSOR) 100 MG tablet Take 100 mg by mouth 2 (two) times daily. 05/27/17  Yes [provider]   omeprazole (PRILOSEC) 20 MG capsule Take 1 capsule (20 mg total) by mouth daily. 03/03/17  Yes [name], MD  OXYGEN Inhale 2 L into the lungs daily.   Yes [provider]  traMADol (ULTRAM) 50 MG tablet Take 1 tablet (50 mg total) by mouth every 12 (twelve) hours as needed. Patient taking differently: Take 50 mg by mouth every 12 (twelve) hours as needed for moderate pain.  06/12/17  Yes [name], MD  warfarin (COUMADIN) 2.5 MG tablet Take coumadin as directed by coumadin clinic Patient taking differently: Take 2.5 mg by mouth daily at 6 PM. Or as directed by coumadin clinic 06/10/17  Yes Fay Records, MD   Liver Function Tests  Recent Labs Lab 06/25/17 1252  AST 31  ALT 13*  ALKPHOS 113  BILITOT 1.8*  PROT 7.2  ALBUMIN 3.0*   No results for input(s): LIPASE, AMYLASE in the last 168 hours. CBC  Recent Labs Lab 06/25/17 1300 06/26/17 0422 06/27/17 0337  WBC 4.2 3.6* 3.6*  HGB 8.0* 7.8* 7.5*  HCT 24.7* 23.9* 22.4*  MCV 75.5* 75.4* 75.2*  PLT 177 181 790   Basic Metabolic Panel  Recent Labs Lab 06/25/17 1300 06/26/17 0422 06/27/17 0337  NA 137 138 138  K 3.8 3.7 3.8  CL 103 103 103  CO2 25 26 26   GLUCOSE 145* 127* 139*  BUN 55* 55* 52*  CREATININE 3.02* 2.83* 2.72*  CALCIUM 8.6* 8.5* 8.3*   Iron/TIBC/Ferritin/ %Sat    Component Value Date/Time   IRON 113 06/27/2017 1102   TIBC 473 (H) 06/27/2017 1102   FERRITIN 31 06/27/2017 1102   IRONPCTSAT 24 06/27/2017 1102    Vitals:   06/27/17 0400 06/27/17 0500 06/27/17 0755 06/27/17 1100  BP: (!) 92/58 95/76 (!) 84/60 99/68  Pulse: (!) 115 (!) 111 (!) 101 (!) 118  Resp: 20 (!) 30 14 15   Temp:   98.9 F (37.2 C) 98.2 F (36.8 C)  TempSrc:   Oral Oral  SpO2: 98% 100% 96% 97%  Weight:  92.3 kg (203 lb 7.8 oz)    Height:       Exam Gen no distress , lying at 30deg No rash, cyanosis or gangrene Sclera anicteric, throat clear  +JVD Chest mostly clear occ rales at bases Cor irreg irreg 2/6  syst M, no RG Abd soft ntnd no mass or ascites +bs GU normal male MS no joint effusions or deformity Ext diffuse 2-3+ pitting edema from ankles  to the shoulders Neuro is alert, Ox 3 , nf  UA 05/30/17 > rare bact, 100 prot, 0-5 wbc/ rbc/ epi CXR - 8/9 CM with vasc congestion Renal US - 10 cm kidneys with ^echogenicity, no hydro, small ascites  Home medications:  - coreg 25 bid/ lasix 80 bid/ metoprolol 100 bid - colchicine daily and prn/ priolosec/ ultram 50 bid prn - coumadin/ oxygen - insulin detemir 10 u qhs - voltaren gel prn    Impression: 1.  CKD stage IV - baseline creat 1.8- 2.4 w/ hx of syst CHF and multiple episodes of admission for cardiorenal syndrome related AKI over the past few yrs. Typically gets better w/ diuresis. However he has very severe and diffuse anasarca.  Bland urine and echogenic kidneys on [place].  Creat improving for now w/ ^'d lasix and milrinone drip.  Will add zaroxyln po.  Could ^lasix a bit more but will hold off for now.  At sig risk of requiring dialysis if edema can't be controlled w/ medication.  Have d/w patient.   2  CM EF 50-55%/ pulm HTN/ hx MVR and TV repair 3  Atrial fibrillation 4  Hypotension - acute d/t decomp CHF vs chronic 5  DM 2 on insulin    Plan - as above  Kelly Splinter MD [facility] pager [phone]   06/27/2017, 2:05 PM

## 2017-06-27 NOTE — Progress Notes (Signed)
Progress Note  Patient Name: Michael [name] Date of Encounter: 06/27/2017  Primary Cardiologist: Dr Harrington Challenger  Subjective   Complains of dyspnea and chest pain  Inpatient Medications    Scheduled Meds: . atorvastatin  20 mg Oral Daily  . carvedilol  25 mg Oral BID WC  . ferrous sulfate  325 mg Oral BID WC  . folic acid  1 mg Oral Daily  . insulin aspart  0-5 Units Subcutaneous QHS  . insulin aspart  0-9 Units Subcutaneous TID WC  . insulin detemir  10 Units Subcutaneous Q2200  . pantoprazole  40 mg Oral Daily   Continuous Infusions: . furosemide Stopped (06/26/17 2212)  . milrinone 0.25 mcg/kg/min (06/26/17 2112)   PRN Meds: traMADol   Vital Signs    Vitals:   06/27/17 0339 06/27/17 0400 06/27/17 0500 06/27/17 0755  BP: 96/72 (!) 92/58 95/76 (!) 84/60  Pulse: (!) 117 (!) 115 (!) 111 (!) 101  Resp: 15 20 (!) 30 14  Temp: 98 F (36.7 C)   98.9 F (37.2 C)  TempSrc: Oral   Oral  SpO2: 96% 98% 100% 96%  Weight:   92.3 kg (203 lb 7.8 oz)   Height:        Intake/Output Summary (Last 24 hours) at 06/27/17 0930 Last data filed at 06/27/17 9357  Gross per 24 hour  Intake           939.86 ml  Output              890 ml  Net            49.86 ml   Filed Weights   06/25/17 2007 06/26/17 2104 06/27/17 0500  Weight: 91.2 kg (201 lb 1 oz) 92.3 kg (203 lb 6.4 oz) 92.3 kg (203 lb 7.8 oz)    Telemetry    Afib, rate high normal- Personally Reviewed   Physical Exam   GEN: WD chronically ill appearing anasarca Neck: Positive JVD Cardiac: irregular and mildly tachycardic Respiratory: Clear to auscultation bilaterally. No wheeze GI: distended; positive ascites MS: 4 + edema. Neuro:  Nonfocal    Labs    Chemistry Recent Labs Lab 06/25/17 1252 06/25/17 1300 06/26/17 0422 06/27/17 0337  NA  --  137 138 138  K  --  3.8 3.7 3.8  CL  --  103 103 103  CO2  --  25 26 26   GLUCOSE  --  145* 127* 139*  BUN  --  55* 55* 52*  CREATININE  --  3.02* 2.83* 2.72*    CALCIUM  --  8.6* 8.5* 8.3*  PROT 7.2  --   --   --   ALBUMIN 3.0*  --   --   --   AST 31  --   --   --   ALT 13*  --   --   --   ALKPHOS 113  --   --   --   BILITOT 1.8*  --   --   --   GFRNONAA  --  21* 23* 24*  GFRAA  --  25* 27* 28*  ANIONGAP  --  9 9 9      Hematology Recent Labs Lab 06/25/17 1300 06/26/17 0422 06/27/17 0337  WBC 4.2 3.6* 3.6*  RBC 3.27* 3.17* 2.98*  HGB 8.0* 7.8* 7.5*  HCT 24.7* 23.9* 22.4*  MCV 75.5* 75.4* 75.2*  MCH 24.5* 24.6* 25.2*  MCHC 32.4 32.6 33.5  RDW 19.2* 19.2* 19.5*  PLT 177  181 173    Recent Labs Lab 06/25/17 1322  TROPIPOC 0.01     BNP Recent Labs Lab 06/25/17 1253  BNP 875.7*     Radiology    Dg Chest 2 View  Result Date: 06/25/2017 CLINICAL DATA:  Acute shortness of breath for 2 days. EXAM: CHEST  2 VIEW COMPARISON:  05/31/2017 and prior radiographs FINDINGS: Cardiomegaly and median sternotomy again noted. Mild pulmonary vascular congestion and bibasilar opacities are again noted. Trace bilateral pleural effusions are present. There is no evidence of pneumothorax or acute bony abnormality. IMPRESSION: Cardiomegaly with mild pulmonary vascular congestion. Unchanged bibasilar opacities which may represent atelectasis/scarring/airspace disease. Trace bilateral pleural effusions. Electronically Signed   By: Margarette Canada M.D.   On: 06/25/2017 13:37   US Renal  Result Date: 06/25/2017 CLINICAL DATA:  Subacute onset of renal insufficiency. Initial encounter. EXAM: RENAL / URINARY TRACT ULTRASOUND COMPLETE COMPARISON:  CT of the abdomen and pelvis performed 05/30/2017 FINDINGS: Right Kidney: Length: 10.3 cm. Increased parenchymal echogenicity is noted. No mass or hydronephrosis visualized. Left Kidney: Length: 9.9 cm. Increased parenchymal echogenicity is noted. A large 1.8 cm stone is noted at the interpole region of the left kidney. No mass or hydronephrosis visualized. Bladder: Appears normal for degree of bladder distention. Small to  moderate volume ascites is noted within the abdomen. Small bilateral pleural effusions are seen. IMPRESSION: 1. No evidence of hydronephrosis. 2. Increased renal parenchymal echogenicity raises concern for medical renal disease. 3. Large 1.8 cm nonobstructing stone again noted at the left kidney. 4. Small to moderate volume ascites within the abdomen. 5. Small bilateral pleural effusions seen. Electronically Signed   By: Garald Balding M.D.   On: 06/25/2017 23:05    Patient Profile     56 year old male with past medical history of chronic combined systolic/diastolic congestive heart failure, prior mitral valve replacement with porcine valve, history of tricuspid valve repair, persistent atrial fibrillation, chronic stage IV kidney disease, diabetes mellitus, gastric reflux disease, obstructive sleep apnea who I am asked to evaluate for acute on chronic combined systolic/diastolic congestive heart failure. Patient underwent mitral valve replacement and tricuspid valve repair in [place] in 2012. Last echocardiogram July 2018 showed low normal LV systolic function, mild aortic insufficiency, bioprosthetic mitral valve with mean gradient 6 mmHg, biatrial enlargement, moderate right ventricular enlargement with moderately reduced function, severe tricuspid regurgitation and moderately elevated pulmonary pressure.   Assessment & Plan    1 acute on chronic combined systolic/diastolic congestive heart failure-patient remains markedly volume overloaded. Likely multifactorial including pulmonary venous hypertension but predominantly right heart failure. I do not think albumin of 3 is the cause of anasarca. Renal insufficiency also contributing. Poor diuresis thus far. I will increase his Lasix to 120 mg IV TID. Continue milrinone. Follow renal function closely. He will likely ultimately require right heart catheterization. I have discussed with Dr Aundra Dubin who will review in AM.   2 acute on chronic stage IV  kidney disease-patient will need renal function monitored closely with diuresis. Would ask nephrology to see.  3 persistent atrial fibrillation-patient appears to have been in atrial fibrillation for greater than 1 year and I therefore doubt this is contributing to his heart failure. BP borderline. Decrease coreg to 12.5 mg BID and add amiodarone 400 mg BID to help with rate control. Could consider DCCV but given duration of atrial fibrillation, think chances of maintaining sinus low. Hold Coumadin in anticipation of right heart catheterization. Add heparin when INR less than 2.  4  valvular heart disease-recent echocardiogram showed mitral valve replacement functioning well. He has had previous tricuspid valve repair and there severe tricuspid regurgitation. This could also be contributing to his volume excess.  Signed, Kirk Ruths, MD  06/27/2017, 9:30 AM

## 2017-06-27 NOTE — Progress Notes (Addendum)
PROGRESS NOTE   Michael [name]  XIP:[id number]    DOB: Jan 19, 1961    DOA: 06/25/2017  PCP: [name], MD   I have briefly reviewed patients previous medical records in [facility].  Brief Narrative:  56 year old male with a PMH of type II DM, HTN, HLD, COPD, CAD, pulmonary hypertension, OSA, DM, mitral valve replacement with porcine valve, tricuspid valve repair, paroxysmal A. fib on Coumadin anticoagulation, chronic systolic CHF but now with improved EF, stage IV chronic kidney disease, cirrhosis, gout, recent hospitalization 05/30/17-06/02/17 for decompensated CHF and anasarca, seen by PCP on day of admission for worsening dyspnea, leg edema despite reported compliance with Lasix 80 mg twice a day but ongoing salt intake and gained 20 pounds in the last 3 weeks, referred to ED for admission.Anasarca, multifactorial related to pulmonary hypertension, right heart failure, cirrhosis, chronic kidney disease and hypoalbuminemia. Cardiology consulted, milrinone drip initiated, transferred to stepdown 8/10. Nephrology consulted 8/11.   Assessment & Plan:   Principal Problem:   Acute kidney injury ([place]) Active Problems:   CKD (chronic kidney disease), stage IV (HCC)   DM type 2 (diabetes mellitus, type 2) (HCC)   Essential hypertension   Chronic systolic CHF (congestive heart failure) (HCC)   Gout   Chronic atrial fibrillation (HCC)   Anasarca   1. Acute on chronic combined systolic and diastolic CHF: 2-D echo 6/73/41 showed EF 35-40 percent but repeat echo 06/01/17 showed EF 50-55 percent, PA pressure 55 mmHg. Recent hospitalization for decompensated CHF at which time cardiology had consulted. Likely precipitated by dietary indiscretion. Counseled extensively regarding all aspects of CHF care including salt restriction. Daily weights, strict intake output. Cardiology consultation appreciated. Suspect etiology to be multifactorial including pulmonary hypertension, predominantly right  heart failure. Other contributors include chronic kidney disease, cirrhosis and hypoalbuminemia. Cardiology has increased Lasix to 120 mg IV 3 times a day, advanced heart failure M.D. to see 8/12, started milrinone drip, nephrology consultation and eventually will need right heart catheterization. Poor diuresis thus far. -388 mL since admission. 2. Anasarca: Secondary to reasons mentioned in problem #1. Management as above. 3. Persistent A. Fib: Cardiology follow-up appreciated. Has been in A. fib for greater than 1 year. Carvedilol decreased to 12.5 MG twice a day and added amiodarone 400 MG daily to help with rate control. Coumadin held for possible right heart cath. Start IV heparin per pharmacy when INRs <2. Cardiology suspects that DCCV will not keep him in sinus rhythm given the prolonged duration of A. fib. 4. Acute on Stage IV chronic kidney disease: Creatinine at recent discharge was 2.17. Baseline creatinine probably in the 2.2-2.3 range. Presented with creatinine of 3.02. Likely related to poor perfusion from CHF. No hydronephrosis on renal ultrasound. Creatinine stable in the last 24 hours. I consulted nephrology 8/11. 5. Type II DM with renal complications: P3X 5.5. CBGs well-controlled in the hospital on Levemir and SSI. 6. Essential hypertension: Controlled. 7. Cirrhosis: Fluid management as above. Some ascites but no urgent need for paracentesis. Outpatient follow-up with [facility]. 8. Hyperlipidemia: Statins. 9. GERD: PPI. 10. History of gout: No acute flare at this time. 11. Microcytic anemia/iron deficiency: Serum iron 28, ferritin 38 in April 2017. Iron supplements. Follow CBCs closely and transfuse if hemoglobin less than 7 g per DL. No overt bleeding. Repeat anemia panel. Check FOBT. May need to check erythropoietin levels >defer to nephrology. 12. Status post porcine MVR and tricuspid valve repair.   DVT prophylaxis: Coumadin discontinued. INR still therapeutic. Start IV  heparin  when INR <2. Code Status: Full Family Communication: None at bedside Disposition: Initially admitted to medical floor. Transferred to stepdown unit on 8/11. DC home when medically improved.   Consultants:  Cardiology   Procedures:  None  Antimicrobials:  None    Subjective: Patient doesn't think he has made much improvement since yesterday. No significant change in swelling. Has not urinated much. Dyspnea with minimal exertion but no chest pain when I saw him this morning. Discussed with RN and requested standing weights daily.  ROS: No dizziness or lightheadedness or palpitations. No bleeding or melena reported.  Objective:  Vitals:   06/27/17 0339 06/27/17 0400 06/27/17 0500 06/27/17 0755  BP: 96/72 (!) 92/58 95/76 (!) 84/60  Pulse: (!) 117 (!) 115 (!) 111 (!) 101  Resp: 15 20 (!) 30 14  Temp: 98 F (36.7 C)   98.9 F (37.2 C)  TempSrc: Oral   Oral  SpO2: 96% 98% 100% 96%  Weight:   92.3 kg (203 lb 7.8 oz)   Height:        Examination:  General exam: Pleasant middle-aged male, moderately built and obese, sitting propped up in bed without distress. Respiratory system: Reduced breath sounds in the bases with few bibasal crackles. Rest of lung fields clear to auscultation. Respiratory effort normal. No change since yesterday. Cardiovascular system: S1 & S2 heard, RRR. No JVD, murmurs, rubs, gallops or clicks. 3+ pitting bilateral leg edema extending to the anterior abdominal wall anteriorly and mid back posteriorly. No significant change in edema. Telemetry: A. fib with ventricular rate in the 100/110s. Gastrointestinal system: Abdomen is mildly distended, soft and nontender. No organomegaly or masses felt. Normal bowel sounds heard. Ascites +. Anterior abdominal wall edema. Central nervous system: Alert and oriented. No focal neurological deficits. Extremities: Symmetric 5 x 5 power. Skin: No rashes, lesions or ulcers Psychiatry: Judgement and insight appear normal. Mood  & affect appropriate.     Data Reviewed: I have personally reviewed following labs and imaging studies  CBC:  Recent Labs Lab 06/25/17 1300 06/26/17 0422 06/27/17 0337  WBC 4.2 3.6* 3.6*  HGB 8.0* 7.8* 7.5*  HCT 24.7* 23.9* 22.4*  MCV 75.5* 75.4* 75.2*  PLT 177 181 161   Basic Metabolic Panel:  Recent Labs Lab 06/25/17 1300 06/26/17 0422 06/27/17 0337  NA 137 138 138  K 3.8 3.7 3.8  CL 103 103 103  CO2 25 26 26   GLUCOSE 145* 127* 139*  BUN 55* 55* 52*  CREATININE 3.02* 2.83* 2.72*  CALCIUM 8.6* 8.5* 8.3*  MG  --   --  2.9*   Liver Function Tests:  Recent Labs Lab 06/25/17 1252  AST 31  ALT 13*  ALKPHOS 113  BILITOT 1.8*  PROT 7.2  ALBUMIN 3.0*   Coagulation Profile:  Recent Labs Lab 06/25/17 1252 06/26/17 0422 06/27/17 0337  INR 2.44 2.45 2.74   Cardiac Enzymes: No results for input(s): CKTOTAL, CKMB, CKMBINDEX, TROPONINI in the last 168 hours. HbA1C: No results for input(s): HGBA1C in the last 72 hours. CBG:  Recent Labs Lab 06/26/17 0739 06/26/17 1129 06/26/17 1639 06/26/17 2115 06/27/17 0814  GLUCAP 85 134* 150* 113* 121*    Recent Results (from the past 240 hour(s))  MRSA PCR Screening     Status: None   Collection Time: 06/26/17 10:03 PM  Result Value Ref Range Status   MRSA by PCR NEGATIVE NEGATIVE Final    Comment:        The GeneXpert MRSA Assay (  FDA approved for NASAL specimens only), is one component of a comprehensive MRSA colonization surveillance program. It is not intended to diagnose MRSA infection nor to guide or monitor treatment for MRSA infections.          Radiology Studies: Dg Chest 2 View  Result Date: 06/25/2017 CLINICAL DATA:  Acute shortness of breath for 2 days. EXAM: CHEST  2 VIEW COMPARISON:  05/31/2017 and prior radiographs FINDINGS: Cardiomegaly and median sternotomy again noted. Mild pulmonary vascular congestion and bibasilar opacities are again noted. Trace bilateral pleural effusions are  present. There is no evidence of pneumothorax or acute bony abnormality. IMPRESSION: Cardiomegaly with mild pulmonary vascular congestion. Unchanged bibasilar opacities which may represent atelectasis/scarring/airspace disease. Trace bilateral pleural effusions. Electronically Signed   By: Margarette Canada M.D.   On: 06/25/2017 13:37   US Renal  Result Date: 06/25/2017 CLINICAL DATA:  Subacute onset of renal insufficiency. Initial encounter. EXAM: RENAL / URINARY TRACT ULTRASOUND COMPLETE COMPARISON:  CT of the abdomen and pelvis performed 05/30/2017 FINDINGS: Right Kidney: Length: 10.3 cm. Increased parenchymal echogenicity is noted. No mass or hydronephrosis visualized. Left Kidney: Length: 9.9 cm. Increased parenchymal echogenicity is noted. A large 1.8 cm stone is noted at the interpole region of the left kidney. No mass or hydronephrosis visualized. Bladder: Appears normal for degree of bladder distention. Small to moderate volume ascites is noted within the abdomen. Small bilateral pleural effusions are seen. IMPRESSION: 1. No evidence of hydronephrosis. 2. Increased renal parenchymal echogenicity raises concern for medical renal disease. 3. Large 1.8 cm nonobstructing stone again noted at the left kidney. 4. Small to moderate volume ascites within the abdomen. 5. Small bilateral pleural effusions seen. Electronically Signed   By: Garald Balding M.D.   On: 06/25/2017 23:05        Scheduled Meds: . amiodarone  400 mg Oral Daily  . atorvastatin  20 mg Oral Daily  . carvedilol  12.5 mg Oral BID WC  . ferrous sulfate  325 mg Oral BID WC  . folic acid  1 mg Oral Daily  . insulin aspart  0-5 Units Subcutaneous QHS  . insulin aspart  0-9 Units Subcutaneous TID WC  . insulin detemir  10 Units Subcutaneous Q2200  . pantoprazole  40 mg Oral Daily   Continuous Infusions: . furosemide    . milrinone 0.25 mcg/kg/min (06/26/17 2112)     LOS: 0 days     Urho Rio, MD, FACP, FHM. [facility] Pager [phone]  If 7PM-7AM, please contact night-coverage [email or web address] Password [facility] 06/27/2017, 10:34 AM

## 2017-06-27 NOTE — Progress Notes (Signed)
Temp at 2000 orall was 100.0. Temperature in room set at 76 degrees. Will decrease slightly to see if it improves, but notified [facility] provider on call. Looking back looks like first elevated temp was today around 1600. Will continue to monitor and follow orders from [facility] provider.

## 2017-06-27 NOTE — Progress Notes (Signed)
[place] for warfarin>> heparin Indication: atrial fibrillation  No Known Allergies  Labs:  Recent Labs  06/25/17 1252  06/25/17 1300 06/26/17 0422 06/27/17 0337  HGB  --   < > 8.0* 7.8* 7.5*  HCT  --   --  24.7* 23.9* 22.4*  PLT  --   --  177 181 173  LABPROT 26.9*  --   --  27.0* 29.6*  INR 2.44  --   --  2.45 2.74  CREATININE  --   --  3.02* 2.83* 2.72*  < > = values in this interval not displayed.  Estimated Creatinine Clearance: 31.3 mL/min (A) (by C-G formula based on SCr of 2.72 mg/dL (H)).   Medical History: Past Medical History:  Diagnosis Date  . Anemia of chronic disease   . Asthma    said he does not know  . Chronic kidney disease (CKD), stage IV (severe) ([place])   . Chronic systolic CHF (congestive heart failure) (HCC)    a. prior EF normal; b. Echo 10/16: Mild LVH, EF 30-35%, mitral valve bioprosthesis present without evidence of stenosis or regurgitation, severe LAE, mild RVE with mild to moderately reduced RVSF, moderate RAE  . Coronary artery disease   . Diabetes mellitus type 2 in obese ([place])   . Diabetes mellitus without complication ([place])   . Dyspnea   . GERD (gastroesophageal reflux disease)   . H/O tricuspid valve repair 2012  . History of echocardiogram    Echo [facility] 4/12: mod LVH, EF 60-65%, mod LAE, tissue MVR ok, mod TR, mod pulmo HTN with RVSP 48 mmHg  . Hypertension   . Obstructive sleep apnea   . Paroxysmal atrial fibrillation (HCC)    s/p Maze procedure at time of MVR in 2011  . Pulmonary HTN ([place])   . S/P mitral valve replacement with porcine valve 2012    Assessment: Pt on warfarin PTA for afib/MVR (bioprosthetic) with plans to hold and initiate heparin once INR is < 2 with plans for heart cath. INR today is 2.74, CBC stable.  Goal of Therapy:  INR 2-3  Heparin Level 0.3-0.7 Monitor platelets by anticoagulation protocol: Yes   Plan:  -Hold warfarin -Initiate heparin once INR  < 2 -Daily INR  Arrie Senate, PharmD PGY-2 Cardiology Pharmacy Resident Pager: [phone] 06/27/2017

## 2017-06-28 DIAGNOSIS — D509 Iron deficiency anemia, unspecified: Secondary | ICD-10-CM

## 2017-06-28 DIAGNOSIS — M109 Gout, unspecified: Secondary | ICD-10-CM

## 2017-06-28 DIAGNOSIS — N179 Acute kidney failure, unspecified: Secondary | ICD-10-CM

## 2017-06-28 DIAGNOSIS — I481 Persistent atrial fibrillation: Secondary | ICD-10-CM

## 2017-06-28 LAB — BASIC METABOLIC PANEL
Anion gap: 8 (ref 5–15)
BUN: 51 mg/dL — AB (ref 6–20)
CHLORIDE: 102 mmol/L (ref 101–111)
CO2: 27 mmol/L (ref 22–32)
Calcium: 8.3 mg/dL — ABNORMAL LOW (ref 8.9–10.3)
Creatinine, Ser: 2.68 mg/dL — ABNORMAL HIGH (ref 0.61–1.24)
GFR calc Af Amer: 29 mL/min — ABNORMAL LOW (ref >60)
GFR calc non Af Amer: 25 mL/min — ABNORMAL LOW (ref >60)
GLUCOSE: 139 mg/dL — AB (ref 65–99)
Potassium: 3.5 mmol/L (ref 3.5–5.1)
SODIUM: 137 mmol/L (ref 135–145)

## 2017-06-28 LAB — PROTIME-INR
INR: 2.73
Prothrombin Time: 29.5 seconds — ABNORMAL HIGH (ref 11.4–15.2)

## 2017-06-28 LAB — CBC
HEMATOCRIT: 22.3 % — AB (ref 39.0–52.0)
Hemoglobin: 7.3 g/dL — ABNORMAL LOW (ref 13.0–17.0)
MCH: 24.6 pg — AB (ref 26.0–34.0)
MCHC: 32.7 g/dL (ref 30.0–36.0)
MCV: 75.1 fL — AB (ref 78.0–100.0)
PLATELETS: 163 10*3/uL (ref 150–400)
RBC: 2.97 MIL/uL — ABNORMAL LOW (ref 4.22–5.81)
RDW: 19.1 % — AB (ref 11.5–15.5)
WBC: 4.9 10*3/uL (ref 4.0–10.5)

## 2017-06-28 LAB — GLUCOSE, CAPILLARY
GLUCOSE-CAPILLARY: 191 mg/dL — AB (ref 65–99)
Glucose-Capillary: 112 mg/dL — ABNORMAL HIGH (ref 65–99)
Glucose-Capillary: 259 mg/dL — ABNORMAL HIGH (ref 65–99)
Glucose-Capillary: 265 mg/dL — ABNORMAL HIGH (ref 65–99)

## 2017-06-28 MED ORDER — HYDROMORPHONE HCL 1 MG/ML IJ SOLN
0.5000 mg | INTRAMUSCULAR | Status: DC | PRN
  Administered 2017-07-13 – 2017-07-24 (×15): 0.5 mg via INTRAVENOUS
  Filled 2017-06-28 (×17): qty 0.5

## 2017-06-28 MED ORDER — AMIODARONE HCL 200 MG PO TABS
400.0000 mg | ORAL_TABLET | Freq: Two times a day (BID) | ORAL | Status: DC
  Administered 2017-06-28 – 2017-07-16 (×35): 400 mg via ORAL
  Filled 2017-06-28 (×35): qty 2

## 2017-06-28 MED ORDER — ACETAMINOPHEN 325 MG PO TABS
325.0000 mg | ORAL_TABLET | Freq: Four times a day (QID) | ORAL | Status: DC | PRN
  Administered 2017-07-06 – 2017-07-11 (×2): 325 mg via ORAL
  Filled 2017-06-28 (×3): qty 1

## 2017-06-28 MED ORDER — POTASSIUM CHLORIDE CRYS ER 20 MEQ PO TBCR
40.0000 meq | EXTENDED_RELEASE_TABLET | Freq: Once | ORAL | Status: AC
  Administered 2017-06-28: 40 meq via ORAL
  Filled 2017-06-28: qty 2

## 2017-06-28 MED ORDER — PREDNISONE 20 MG PO TABS
40.0000 mg | ORAL_TABLET | Freq: Every day | ORAL | Status: DC
Start: 2017-06-28 — End: 2017-07-06
  Administered 2017-06-28 – 2017-07-06 (×9): 40 mg via ORAL
  Filled 2017-06-28 (×10): qty 2

## 2017-06-28 MED ORDER — ASPIRIN 81 MG PO CHEW
81.0000 mg | CHEWABLE_TABLET | ORAL | Status: AC
  Administered 2017-06-29: 81 mg via ORAL
  Filled 2017-06-28: qty 1

## 2017-06-28 MED ORDER — SODIUM CHLORIDE 0.9% FLUSH
3.0000 mL | Freq: Two times a day (BID) | INTRAVENOUS | Status: DC
  Administered 2017-06-28: 3 mL via INTRAVENOUS

## 2017-06-28 MED ORDER — SALINE SPRAY 0.65 % NA SOLN
2.0000 | Freq: Two times a day (BID) | NASAL | Status: DC
  Administered 2017-06-28 – 2017-07-27 (×47): 2 via NASAL
  Filled 2017-06-28 (×4): qty 44

## 2017-06-28 MED ORDER — SODIUM CHLORIDE 0.9 % IV SOLN: 250.0000 mL | INTRAVENOUS | Status: DC | PRN

## 2017-06-28 MED ORDER — CARVEDILOL 6.25 MG PO TABS
6.2500 mg | ORAL_TABLET | Freq: Two times a day (BID) | ORAL | Status: DC
  Administered 2017-06-28 – 2017-06-30 (×3): 6.25 mg via ORAL
  Filled 2017-06-28 (×6): qty 1

## 2017-06-28 MED ORDER — SODIUM CHLORIDE 0.9% FLUSH: 3.0000 mL | INTRAVENOUS | Status: DC | PRN

## 2017-06-28 MED ORDER — METOLAZONE 5 MG PO TABS
5.0000 mg | ORAL_TABLET | Freq: Two times a day (BID) | ORAL | Status: DC
  Administered 2017-06-28 – 2017-07-01 (×5): 5 mg via ORAL
  Filled 2017-06-28 (×6): qty 1

## 2017-06-28 MED ORDER — SODIUM CHLORIDE 0.9 % IV SOLN
INTRAVENOUS | Status: DC
  Administered 2017-06-29: 07:00:00 via INTRAVENOUS

## 2017-06-28 MED ORDER — OXYCODONE HCL 5 MG PO TABS
5.0000 mg | ORAL_TABLET | ORAL | Status: DC | PRN
  Administered 2017-06-28 – 2017-07-24 (×27): 5 mg via ORAL
  Filled 2017-06-28 (×27): qty 1

## 2017-06-28 NOTE — Progress Notes (Signed)
[facility] Progress Note  Subjective: 2.1 L out yesterday.    Vitals:   06/28/17 0317 06/28/17 0500 06/28/17 0552 06/28/17 0759  BP: 111/78     Pulse: (!) 110     Resp: 18     Temp: 99.3 F (37.4 C)  99.2 F (37.3 C) 99.7 F (37.6 C)  TempSrc: Oral     SpO2: 96%     Weight:  91.7 kg (202 lb 3.2 oz)    Height:        Inpatient medications: . amiodarone  400 mg Oral BID  . atorvastatin  20 mg Oral Daily  . carvedilol  6.25 mg Oral BID WC  . ferrous sulfate  325 mg Oral BID WC  . folic acid  1 mg Oral Daily  . insulin aspart  0-5 Units Subcutaneous QHS  . insulin aspart  0-9 Units Subcutaneous TID WC  . insulin detemir  10 Units Subcutaneous Q2200  . metolazone  5 mg Oral BID  . pantoprazole  40 mg Oral Daily  . potassium chloride  40 mEq Oral Once  . predniSONE  40 mg Oral Q breakfast  . sodium chloride  2 spray Each Nare BID   . furosemide 120 mg (06/28/17 0929)  . milrinone 0.25 mcg/kg/min (06/27/17 1600)   acetaminophen, HYDROmorphone (DILAUDID) injection, oxyCODONE  Exam: Gen no distress , lying at 30deg No rash, cyanosis or gangrene Sclera anicteric, throat clear  +JVD Chest mostly clear occ rales at bases Cor irreg irreg 2/6 syst M, no RG Abd soft ntnd no mass or ascites +bs GU normal male MS no joint effusions or deformity Ext diffuse 2-3+ pitting edema from ankles to the shoulders Neuro is alert, Ox 3 , nf  UA 05/30/17 > rare bact, 100 prot, 0-5 wbc/ rbc/ epi CXR - 8/9 CM with vasc congestion Renal US - 10 cm kidneys with ^echogenicity, no hydro, small ascites  Home medications:  - coreg 25 bid/ lasix 80 bid/ metoprolol 100 bid - colchicine daily and prn/ priolosec/ ultram 50 bid prn - coumadin/ oxygen - insulin detemir 10 u qhs - voltaren gel prn      Impression: 1.  CKD IV - baseline creat 1.8- 2.4 w/ hx of syst CHF and multiple episodes of admission for cardiorenal-type AKI over the past few yrs.  However he has very severe  and diffuse anasarca this time.  Has severe RV failure per CHF team which fits his presentation.  Has edema up to the scapulae bilat, hx cirrhosis by imaging but alb 3.0.  Bland urine and echogenic 10 cm kidneys on [place].  Creat down slightly w/ diuresis but not diuresing a great amount on high-dose Lasix and po metolazone.  Going for R heart cath tomorrow.  Poor prognosis, not sure dialysis would improve outcome given comorbidities. Will follow.    2  Anasarca/ R heart failure/ hx TV repair and bio MV replacement 2012 3  Atrial fibrillation 4  DM2 on insulin  Kelly Splinter MD [facility] pager [phone]   06/28/2017, 11:46 AM    Recent Labs Lab 06/26/17 0422 06/27/17 0337 06/28/17 0302  NA 138 138 137  K 3.7 3.8 3.5  CL 103 103 102  CO2 26 26 27   GLUCOSE 127* 139* 139*  BUN 55* 52* 51*  CREATININE 2.83* 2.72* 2.68*  CALCIUM 8.5* 8.3* 8.3*    Recent Labs Lab 06/25/17 1252  AST 31  ALT 13*  ALKPHOS 113  BILITOT 1.8*  PROT 7.2  ALBUMIN 3.0*    Recent Labs Lab 06/26/17 0422 06/27/17 0337 06/28/17 0302  WBC 3.6* 3.6* 4.9  HGB 7.8* 7.5* 7.3*  HCT 23.9* 22.4* 22.3*  MCV 75.4* 75.2* 75.1*  PLT 181 173 163   Iron/TIBC/Ferritin/ %Sat    Component Value Date/Time   IRON 113 06/27/2017 1102   TIBC 473 (H) 06/27/2017 1102   FERRITIN 31 06/27/2017 1102   IRONPCTSAT 24 06/27/2017 1102

## 2017-06-28 NOTE — Progress Notes (Signed)
Called daughter [name] at [phone] [facility] phone number listed) and left a voice message asking her to call us back to give consent for a cardiac catheterization. Also called [phone] but received a message saying, "mailbox full". Will try again in morning if no return call before then.

## 2017-06-28 NOTE — Progress Notes (Signed)
PROGRESS NOTE   Michael [name]  JQB:[id number]    DOB: Jan 13, 1961    DOA: 06/25/2017  PCP: [name], MD   I have briefly reviewed patients previous medical records in [facility].  Brief Narrative:  56 year old male with a PMH of type II DM, HTN, HLD, COPD, CAD, pulmonary hypertension, OSA, DM, mitral valve replacement with porcine valve, tricuspid valve repair, paroxysmal A. fib on Coumadin anticoagulation, chronic systolic CHF but now with improved EF, stage IV chronic kidney disease, cirrhosis, gout, recent hospitalization 05/30/17-06/02/17 for decompensated CHF and anasarca, seen by PCP on day of admission for worsening dyspnea, leg edema despite reported compliance with Lasix 80 mg twice a day but ongoing salt intake and gained 20 pounds in the last 3 weeks, referred to ED for admission.Anasarca, multifactorial related to pulmonary hypertension, right heart failure, cirrhosis, chronic kidney disease and hypoalbuminemia. Cardiology consulted, milrinone drip initiated, transferred to stepdown 8/10. Nephrology consulted 8/11.   Assessment & Plan:   Principal Problem:   Acute kidney injury ([place]) Active Problems:   CKD (chronic kidney disease), stage IV (HCC)   DM type 2 (diabetes mellitus, type 2) (HCC)   Essential hypertension   Chronic systolic CHF (congestive heart failure) (HCC)   Gout   Chronic atrial fibrillation (HCC)   Anasarca   Acute on chronic combined systolic and diastolic CHF (congestive heart failure) ([place])   1. Acute on chronic diastolic CHF: 2-D echo 02/24/72 showed EF 35-40 percent but repeat echo 06/01/17 showed EF 50-55 percent, PA pressure 55 mmHg. Recent hospitalization for decompensated CHF at which time cardiology had consulted. Likely precipitated by dietary indiscretion. Counseled extensively regarding all aspects of CHF care including salt restriction. Daily weights, strict intake output. Cardiology consultation appreciated. Suspect etiology to be  multifactorial including pulmonary hypertension, predominantly right heart failure. Other contributors include chronic kidney disease, cirrhosis and hypoalbuminemia. Cardiology has increased Lasix to 120 mg IV 3 times a day, advanced heart failure M.D. to see 8/12, started milrinone drip, nephrology consultation and eventually will need right heart catheterization. Poor diuresis thus far. -388 mL since admission. Advanced HF MD follow up appreciated: Remains very volume overloaded, creatinine has stabilized but still not diuresing well, continue Lasix 120 mg IV tid, milrinone for IV support, nephrology has added metolazone 5 MG bid and plan for RHC tomorrow to assess PA pressures and filling pressure. -1.3 L since admission. No change in weight since admission. 2. Anasarca: Secondary to reasons mentioned in problem #1. Management as above. 3. Persistent A. Fib: Cardiology follow-up appreciated. Has been in A. fib for greater than 1 year. Carvedilol decreased to 6.25 MG twice a day and amiodarone increased to 400 MG bid to help with rate control and keep an NSR after DCCV. Coumadin held for possible right heart cath. Start IV heparin per pharmacy when INRs <2. Cardiology suspects that DCCV will not keep him in sinus rhythm given the prolonged duration of A. fib, but worth a try and plan TEE guided DCCV when volume is better controlled and off milrinone.. 4. Acute on Stage IV chronic kidney disease: Creatinine at recent discharge was 2.17. Baseline creatinine probably in the 2 1.8-2.4 range. Presented with creatinine of 3.02. Likely related to poor perfusion from CHF/cardiorenal syndrome-multiple episodes of admission for same in the past few years and typically got better with diuresis. No hydronephrosis on renal ultrasound. Creatinine stable over the last 3 days. Nephrology input appreciated. Continuing high-dose IV Lasix and Zaroxolyn added. At risk of requiring dialysis  if edema can't be controlled with  medications. 5. Type II DM with renal complications: N9G 5.5. CBGs well-controlled in the hospital on Levemir and SSI. Hyperglycemia may worsen due to steroids initiated for gout and may need insulin adjustments. Monitor closely. 6. Essential hypertension: Controlled. 7. Cirrhosis: Fluid management as above. Some ascites but no urgent need for paracentesis. Outpatient follow-up with  GI. 8. Hyperlipidemia: Statins. 9. GERD: PPI. 10. History of gout: No acute flare at this time. 11. Microcytic anemia/iron deficiency: Serum iron 28, ferritin 38 in April 2017. Iron supplements. Follow CBCs closely and transfuse if hemoglobin less than 7 g per DL. No overt bleeding. Check FOBT. May need to check erythropoietin levels >defer to nephrology. Repeat anemia panel: Iron 113, TIBC 473, saturation 24, ferritin 31, folate 45, B12: 934. Iron supplements 12. Status post porcine MVR and tricuspid valve repair. 13. Acute gout: Bilateral great toes, left fourth toe and right elbow. Avoiding NSAIDs and colchicine due to kidney disease. Start prednisone 40 mg daily and add Dilaudid for pain control. Low-grade fevers likely related to gout flare. However patient overall is immunocompromised and hence will get a set of blood cultures but monitor off of antibiotics. 14. Left nostril epistaxis: Clear etiology. Remains anticoagulated despite holding Coumadin/INR 2.73. Counseled not to blow nose. Monitor closely.   DVT prophylaxis: Coumadin discontinued. INR still therapeutic. Start IV heparin when INR <2. Code Status: Full Family Communication: None at bedside Disposition: Initially admitted to medical floor. Transferred to stepdown unit on 8/11. DC home when medically improved.   Consultants:  Cardiology  Nephrology   Procedures:  None  Antimicrobials:  None    Subjective: Reports pain in bilateral the toes, left fourth toe and right elbow consistent with prior episodes of gout. Dyspnea may be  slightly better. RN reported self-limiting left nostril bleed.   ROS: No dizziness or lightheadedness or palpitations. No bleeding or melena reported.  Objective:  Vitals:   06/28/17 0317 06/28/17 0500 06/28/17 0552 06/28/17 0759  BP: 111/78     Pulse: (!) 110     Resp: 18     Temp: 99.3 F (37.4 C)  99.2 F (37.3 C) 99.7 F (37.6 C)  TempSrc: Oral     SpO2: 96%     Weight:  91.7 kg (202 lb 3.2 oz)    Height:        Examination:  General exam: Pleasant middle-aged male, moderately built and obese, ill-looking, sitting up in chair in some painful distress but no respiratory distress. Respiratory system: Reduced breath sounds in the bases with few bibasal crackles. Rest of lung fields clear to auscultation. Respiratory effort normal. No significant change since yesterday.  Cardiovascular system: S1 & S2 heard, RRR. No JVD, murmurs, rubs, gallops or clicks. 3+ pitting bilateral leg edema extending to the anterior abdominal wall anteriorly and mid back posteriorly. No significant change since yesterday. Telemetry: A. fib with ventricular rate in the 90s-100s.  Gastrointestinal system: Abdomen is mildly distended, soft and nontender. No organomegaly or masses felt. Normal bowel sounds heard. Ascites +. Anterior abdominal wall edema. No change since yesterday.  Central nervous system: Alert and oriented. No focal neurological deficits. Stable.  Extremities: Symmetric 5 x 5 power. Bilateral toes, left fourth toe and right elbow with tenderness, painful range of movements but no significant swelling other than edema. Mild increase in warmth. No redness.  Skin: No rashes, lesions or ulcers Psychiatry: Judgement and insight appear normal. Mood & affect appropriate.  Data Reviewed: I have personally reviewed following labs and imaging studies  CBC:  Recent Labs Lab 06/25/17 1300 06/26/17 0422 06/27/17 0337 06/28/17 0302  WBC 4.2 3.6* 3.6* 4.9  HGB 8.0* 7.8* 7.5* 7.3*  HCT 24.7*  23.9* 22.4* 22.3*  MCV 75.5* 75.4* 75.2* 75.1*  PLT 177 181 173 599   Basic Metabolic Panel:  Recent Labs Lab 06/25/17 1300 06/26/17 0422 06/27/17 0337 06/28/17 0302  NA 137 138 138 137  K 3.8 3.7 3.8 3.5  CL 103 103 103 102  CO2 25 26 26 27   GLUCOSE 145* 127* 139* 139*  BUN 55* 55* 52* 51*  CREATININE 3.02* 2.83* 2.72* 2.68*  CALCIUM 8.6* 8.5* 8.3* 8.3*  MG  --   --  2.9*  --    Liver Function Tests:  Recent Labs Lab 06/25/17 1252  AST 31  ALT 13*  ALKPHOS 113  BILITOT 1.8*  PROT 7.2  ALBUMIN 3.0*   Coagulation Profile:  Recent Labs Lab 06/25/17 1252 06/26/17 0422 06/27/17 0337 06/28/17 0302  INR 2.44 2.45 2.74 2.73   Cardiac Enzymes: No results for input(s): CKTOTAL, CKMB, CKMBINDEX, TROPONINI in the last 168 hours. HbA1C: No results for input(s): HGBA1C in the last 72 hours. CBG:  Recent Labs Lab 06/27/17 0814 06/27/17 1158 06/27/17 1722 06/27/17 2126 06/28/17 0756  GLUCAP 121* 132* 135* 158* 112*    Recent Results (from the past 240 hour(s))  MRSA PCR Screening     Status: None   Collection Time: 06/26/17 10:03 PM  Result Value Ref Range Status   MRSA by PCR NEGATIVE NEGATIVE Final    Comment:        The GeneXpert MRSA Assay (FDA approved for NASAL specimens only), is one component of a comprehensive MRSA colonization surveillance program. It is not intended to diagnose MRSA infection nor to guide or monitor treatment for MRSA infections.          Radiology Studies: No results found.      Scheduled Meds: . amiodarone  400 mg Oral BID  . atorvastatin  20 mg Oral Daily  . carvedilol  6.25 mg Oral BID WC  . ferrous sulfate  325 mg Oral BID WC  . folic acid  1 mg Oral Daily  . insulin aspart  0-5 Units Subcutaneous QHS  . insulin aspart  0-9 Units Subcutaneous TID WC  . insulin detemir  10 Units Subcutaneous Q2200  . metolazone  5 mg Oral BID  . pantoprazole  40 mg Oral Daily  . potassium chloride  40 mEq Oral Once    . predniSONE  40 mg Oral Q breakfast   Continuous Infusions: . furosemide 120 mg (06/28/17 0929)  . milrinone 0.25 mcg/kg/min (06/27/17 1600)     LOS: 1 day     Keyatta Tolles, MD, FACP, FHM. [facility] Pager [phone]  If 7PM-7AM, please contact night-coverage [email or web address] Password [email or web address] 06/28/2017, 11:10 AM

## 2017-06-28 NOTE — Progress Notes (Signed)
Patient ID: Michael [name], male   DOB: 16-Mar-1961, 56 y.o.   MRN: [id number]     Advanced Heart Failure Rounding Note  Primary Cardiologist: Dr. Harrington Challenger HF Cardiology: Luanna Cole  Subjective:    Patient feels "bad" this morning, short of breath.  Weight is unchanged, diuresis was sluggish yesterday.  Creatinine is slightly better.  He remains on high dose IV Lasix and milrinone 0.25 for RV support.   Echo (7/18): EF 50-55%, mild LVH, mild AI, bioprosthetic mitral valve appeared to function normally, RV moderately dilated with moderately decreased systolic function, D-shaped interventricular septum suggestive of RV pressure/volume overload, severe TR, PASP 54 mmHg  Objective:   Weight Range: 202 lb 3.2 oz (91.7 kg) Body mass index is 33.65 kg/m.   Vital Signs:   Temp:  [98.2 F (36.8 C)-100 F (37.8 C)] 99.7 F (37.6 C) (08/12 0759) Pulse Rate:  [107-118] 110 (08/12 0317) Resp:  [15-23] 18 (08/12 0317) BP: (88-111)/(57-80) 111/78 (08/12 0317) SpO2:  [93 %-97 %] 96 % (08/12 0317) Weight:  [202 lb 3.2 oz (91.7 kg)] 202 lb 3.2 oz (91.7 kg) (08/12 0500) Last BM Date: 06/26/17  Weight change: Filed Weights   06/26/17 2104 06/27/17 0500 06/28/17 0500  Weight: 203 lb 6.4 oz (92.3 kg) 203 lb 7.8 oz (92.3 kg) 202 lb 3.2 oz (91.7 kg)    Intake/Output:   Intake/Output Summary (Last 24 hours) at 06/28/17 1032 Last data filed at 06/28/17 0800  Gross per 24 hour  Intake             1100 ml  Output             2100 ml  Net            -1000 ml      Physical Exam    General:  NAD HEENT: Normal Neck: Supple. JVP 16+ cm, prominent CV wave. No lymphadenopathy or thyromegaly appreciated. Cor: PMI nondisplaced. Mildly tachy, irregular rate & rhythm. 3/6 HSM LLSB.  No S3/S4.  Lungs: Clear bilaterally.  Abdomen: Soft, nontender, nondistended. No hepatosplenomegaly. No bruits or masses. Good bowel sounds. Extremities: No cyanosis, clubbing, rash.  2+ edema to knees bilaterally.    Neuro: Alert & orientedx3, cranial nerves grossly intact. moves all 4 extremities w/o difficulty. Affect pleasant   Telemetry   Personally reviewed, atrial fibrillation in 100s  Labs    CBC  Recent Labs  06/27/17 0337 06/28/17 0302  WBC 3.6* 4.9  HGB 7.5* 7.3*  HCT 22.4* 22.3*  MCV 75.2* 75.1*  PLT 173 536   Basic Metabolic Panel  Recent Labs  06/27/17 0337 06/28/17 0302  NA 138 137  K 3.8 3.5  CL 103 102  CO2 26 27  GLUCOSE 139* 139*  BUN 52* 51*  CREATININE 2.72* 2.68*  CALCIUM 8.3* 8.3*  MG 2.9*  --    Liver Function Tests  Recent Labs  06/25/17 1252  AST 31  ALT 13*  ALKPHOS 113  BILITOT 1.8*  PROT 7.2  ALBUMIN 3.0*   No results for input(s): LIPASE, AMYLASE in the last 72 hours. Cardiac Enzymes No results for input(s): CKTOTAL, CKMB, CKMBINDEX, TROPONINI in the last 72 hours.  BNP: BNP (last 3 results)  Recent Labs  10/05/16 0147 05/30/17 1208 06/25/17 1253  BNP 237.4* 1,404.9* 875.7*    ProBNP (last 3 results)  Recent Labs  03/16/17 1401  PROBNP 6,530*     D-Dimer No results for input(s): DDIMER in the last 72 hours. Hemoglobin A1C  No results for input(s): HGBA1C in the last 72 hours. Fasting Lipid Panel No results for input(s): CHOL, HDL, LDLCALC, TRIG, CHOLHDL, LDLDIRECT in the last 72 hours. Thyroid Function Tests No results for input(s): TSH, T4TOTAL, T3FREE, THYROIDAB in the last 72 hours.  Invalid input(s): FREET3  Other results:   Imaging     No results found.   Medications:     Scheduled Medications: . amiodarone  400 mg Oral BID  . atorvastatin  20 mg Oral Daily  . carvedilol  6.25 mg Oral BID WC  . ferrous sulfate  325 mg Oral BID WC  . folic acid  1 mg Oral Daily  . insulin aspart  0-5 Units Subcutaneous QHS  . insulin aspart  0-9 Units Subcutaneous TID WC  . insulin detemir  10 Units Subcutaneous Q2200  . metolazone  5 mg Oral BID  . pantoprazole  40 mg Oral Daily  . potassium chloride  40  mEq Oral Once  . predniSONE  40 mg Oral Q breakfast     Infusions: . furosemide 120 mg (06/28/17 0929)  . 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 Patient underwent mitral valve replacement and tricuspid valve repair in [place] in 2012.    Assessment/Plan   1. Acute on chronic diastolic CHF: EF 85-88% on echo in 7/18 with prominent RV failure (enlarged/hypokinetic RV with D-shaped interventricular septum).  He has severe TR.  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  He remains very volume overloaded.  Creatinine has stabilized but still not diuresing well.  He is on milrinone for RV support.  - I will arrange for RHC tomorrow to assess PA pressure and filling pressures.  Will aim for brachial access with elevated INR 2.7 (warfarin on hold).  Risks/benefits of procedure discussed with patient and he agrees to proceed.  - Continue milrinone at 0.25 mcg/kg/min for now for RV support.  - Needs diuresis => continue Lasix 120 mg IV tid and agree with renal's addition of metolazone 5 mg bid.  2. AKI on CKD stage IV: Creatinine has stabilized on milrinone, hopefullly will come down with diuresis and lowering of renal venous pressure.  3. Atrial fibrillation: Persistent.  Has been in atrial fibrillation for a number of months it appears.  Mild RVR.  I am not sure how well he will stay in NSR given severe TR and RV failure (even with amiodarone), but given the  severity of his HF, I think that it is worth a trial.  - Cover with heparin gtt if INR drops < 2.   - Decrease Coreg to 6.25 mg bid with soft BP and increase amiodarone to 400 mg bid (for both rate control and to help keep in NSR after DCCV).  - Would aim for TEE-guided DCCV when volume is better controlled and milrinone titrated off.  4. Anemia: No overt GI bleeding, hgb stably low.  Not Fe or B12 deficient.  Await FOBT.  ?Anemia of chronic disease/renal disease.  Would likely benefit from Aranesp as outpatient.  5. Bioprosthetic mitral valve: Stable appearance on 7/18 echo.   Length of Stay: 1  [name], MD  06/28/2017, 10:32 AM  Advanced Heart Failure Team Pager [phone] (M-F; 7a - 4p)  Please contact [facility] for night-coverage after hours (4p -7a ) and weekends on [email or web address]

## 2017-06-28 NOTE — Progress Notes (Signed)
ANTICOAGULATION CONSULT NOTE   Pharmacy Consult for warfarin>> heparin Indication: atrial fibrillation  No Known Allergies  Labs:  Recent Labs  06/26/17 0422 06/27/17 0337 06/28/17 0302  HGB 7.8* 7.5* 7.3*  HCT 23.9* 22.4* 22.3*  PLT 181 173 163  LABPROT 27.0* 29.6* 29.5*  INR 2.45 2.74 2.73  CREATININE 2.83* 2.72* 2.68*    Estimated Creatinine Clearance: 31.7 mL/min (A) (by C-G formula based on SCr of 2.68 mg/dL (H)).   Medical History: Past Medical History:  Diagnosis Date  . Anemia of chronic disease   . Asthma    said he does not know  . Chronic kidney disease (CKD), stage IV (severe) ([place])   . Chronic systolic CHF (congestive heart failure) (HCC)    a. prior EF normal; b. Echo 10/16: Mild LVH, EF 30-35%, mitral valve bioprosthesis present without evidence of stenosis or regurgitation, severe LAE, mild RVE with mild to moderately reduced RVSF, moderate RAE  . Coronary artery disease   . Diabetes mellitus type 2 in obese ([place])   . Diabetes mellitus without complication ([place])   . Dyspnea   . GERD (gastroesophageal reflux disease)   . H/O tricuspid valve repair 2012  . History of echocardiogram    Echo at [facility] 4/12: mod LVH, EF 60-65%, mod LAE, tissue MVR ok, mod TR, mod pulmo HTN with RVSP 48 mmHg  . Hypertension   . Obstructive sleep apnea   . Paroxysmal atrial fibrillation (HCC)    s/p Maze procedure at time of MVR in 2011  . Pulmonary HTN ([place])   . 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 INR today remains elevated at 2.73, CBC stable.  Goal of Therapy:  INR 2-3  Heparin Level 0.3-0.7 Monitor platelets by anticoagulation protocol: Yes   Plan:  -Hold warfarin -Initiate heparin once INR < 2 -Daily INR  Arrie Senate, PharmD PGY-2 Cardiology Pharmacy Resident Pager:  [phone] 06/28/2017

## 2017-06-29 ENCOUNTER — Encounter (HOSPITAL_COMMUNITY): Payer: Self-pay | Admitting: Cardiology

## 2017-06-29 ENCOUNTER — Ambulatory Visit: Payer: Self-pay

## 2017-06-29 ENCOUNTER — Encounter (HOSPITAL_COMMUNITY)
Admission: EM | Disposition: A | Discharge status: Home Health | Payer: Self-pay | Source: Home / Self Care | Attending: Cardiology

## 2017-06-29 DIAGNOSIS — I5033 Acute on chronic diastolic (congestive) heart failure: Secondary | ICD-10-CM

## 2017-06-29 HISTORY — PX: RIGHT HEART CATH: CATH118263

## 2017-06-29 LAB — POCT I-STAT 3, VENOUS BLOOD GAS (G3P V)
ACID-BASE EXCESS: 4 mmol/L — AB (ref 0.0–2.0)
Acid-Base Excess: 5 mmol/L — ABNORMAL HIGH (ref 0.0–2.0)
BICARBONATE: 28.6 mmol/L — AB (ref 20.0–28.0)
BICARBONATE: 29.5 mmol/L — AB (ref 20.0–28.0)
O2 Saturation: 69 %
O2 Saturation: 68 %
PH VEN: 7.451 — AB (ref 7.250–7.430)
PO2 VEN: 34 mmHg (ref 32.0–45.0)
TCO2: 30 mmol/L (ref 0–100)
TCO2: 31 mmol/L (ref 0–100)
pCO2, Ven: 41.1 mmHg — ABNORMAL LOW (ref 44.0–60.0)
pCO2, Ven: 42.6 mmHg — ABNORMAL LOW (ref 44.0–60.0)
pH, Ven: 7.448 — ABNORMAL HIGH (ref 7.250–7.430)
pO2, Ven: 34 mmHg (ref 32.0–45.0)

## 2017-06-29 LAB — CBC
HEMATOCRIT: 21.8 % — AB (ref 39.0–52.0)
Hemoglobin: 7.3 g/dL — ABNORMAL LOW (ref 13.0–17.0)
MCH: 24.8 pg — ABNORMAL LOW (ref 26.0–34.0)
MCHC: 33.5 g/dL (ref 30.0–36.0)
MCV: 74.1 fL — AB (ref 78.0–100.0)
PLATELETS: 153 10*3/uL (ref 150–400)
RBC: 2.94 MIL/uL — ABNORMAL LOW (ref 4.22–5.81)
RDW: 18.9 % — AB (ref 11.5–15.5)
WBC: 5.1 10*3/uL (ref 4.0–10.5)

## 2017-06-29 LAB — BASIC METABOLIC PANEL
Anion gap: 10 (ref 5–15)
BUN: 53 mg/dL — AB (ref 6–20)
CO2: 27 mmol/L (ref 22–32)
CREATININE: 2.73 mg/dL — AB (ref 0.61–1.24)
Calcium: 8.4 mg/dL — ABNORMAL LOW (ref 8.9–10.3)
Chloride: 98 mmol/L — ABNORMAL LOW (ref 101–111)
GFR calc Af Amer: 28 mL/min — ABNORMAL LOW (ref >60)
GFR, EST NON AFRICAN AMERICAN: 24 mL/min — AB (ref >60)
GLUCOSE: 151 mg/dL — AB (ref 65–99)
POTASSIUM: 3.6 mmol/L (ref 3.5–5.1)
SODIUM: 135 mmol/L (ref 135–145)

## 2017-06-29 LAB — GLUCOSE, CAPILLARY
Glucose-Capillary: 117 mg/dL — ABNORMAL HIGH (ref 65–99)
Glucose-Capillary: 167 mg/dL — ABNORMAL HIGH (ref 65–99)
Glucose-Capillary: 201 mg/dL — ABNORMAL HIGH (ref 65–99)

## 2017-06-29 LAB — PROTIME-INR
INR: 2.6
Prothrombin Time: 28.4 seconds — ABNORMAL HIGH (ref 11.4–15.2)

## 2017-06-29 SURGERY — RIGHT HEART CATH
Anesthesia: LOCAL

## 2017-06-29 MED ORDER — MIDAZOLAM HCL 2 MG/2ML IJ SOLN
INTRAMUSCULAR | Status: DC | PRN
  Administered 2017-06-29: 0.5 mg via INTRAVENOUS

## 2017-06-29 MED ORDER — MIDAZOLAM HCL 2 MG/2ML IJ SOLN
INTRAMUSCULAR | Status: AC
  Filled 2017-06-29: qty 2

## 2017-06-29 MED ORDER — HEPARIN (PORCINE) IN NACL 2-0.9 UNIT/ML-% IJ SOLN
INTRAMUSCULAR | Status: AC
  Filled 2017-06-29: qty 500

## 2017-06-29 MED ORDER — HEPARIN (PORCINE) IN NACL 2-0.9 UNIT/ML-% IJ SOLN
INTRAMUSCULAR | Status: AC | PRN
  Administered 2017-06-29: 500 mL

## 2017-06-29 MED ORDER — LIDOCAINE HCL (PF) 1 % IJ SOLN
INTRAMUSCULAR | Status: DC | PRN
  Administered 2017-06-29: 2 mL

## 2017-06-29 MED ORDER — LIDOCAINE HCL (PF) 1 % IJ SOLN
INTRAMUSCULAR | Status: AC
  Filled 2017-06-29: qty 30

## 2017-06-29 MED ORDER — WARFARIN - PHARMACIST DOSING INPATIENT: Freq: Every day | Status: DC

## 2017-06-29 MED ORDER — POTASSIUM CHLORIDE CRYS ER 20 MEQ PO TBCR
40.0000 meq | EXTENDED_RELEASE_TABLET | Freq: Once | ORAL | Status: AC
Start: 2017-06-29 — End: 2017-06-29
  Administered 2017-06-29: 40 meq via ORAL
  Filled 2017-06-29: qty 2

## 2017-06-29 MED ORDER — FUROSEMIDE 10 MG/ML IJ SOLN
160.0000 mg | Freq: Three times a day (TID) | INTRAVENOUS | Status: DC
  Administered 2017-06-29 – 2017-06-30 (×4): 160 mg via INTRAVENOUS
  Filled 2017-06-29: qty 16
  Filled 2017-06-29: qty 10
  Filled 2017-06-29 (×2): qty 16
  Filled 2017-06-29: qty 12
  Filled 2017-06-29 (×2): qty 16

## 2017-06-29 MED ORDER — WARFARIN SODIUM 2.5 MG PO TABS
2.5000 mg | ORAL_TABLET | Freq: Once | ORAL | Status: AC
  Administered 2017-06-29: 2.5 mg via ORAL
  Filled 2017-06-29: qty 1

## 2017-06-29 SURGICAL SUPPLY — 7 items
CATH BALLN WEDGE 5F 110CM (CATHETERS) ×1 IMPLANT
KIT HEART LEFT (KITS) ×2 IMPLANT
PACK CARDIAC CATHETERIZATION (CUSTOM PROCEDURE TRAY) ×2 IMPLANT
SHEATH GLIDE SLENDER 4/5FR (SHEATH) ×1 IMPLANT
TRANSDUCER W/STOPCOCK (MISCELLANEOUS) ×2 IMPLANT
TUBING ART PRESS 72  MALE/FEM (TUBING) ×1
TUBING ART PRESS 72 MALE/FEM (TUBING) IMPLANT

## 2017-06-29 NOTE — Progress Notes (Signed)
Patient ID: Michael [name], male   DOB: 1961/07/13, 56 y.o.   MRN: [id number] S:feels better today O:BP (!) 105/59   Pulse (!) 104   Temp 97.6 F (36.4 C) (Oral)   Resp 16   Ht 5\' 5"  (1.651 m)   Wt 91.1 kg (200 lb 13.4 oz)   SpO2 97%   BMI 33.42 kg/m   Intake/Output Summary (Last 24 hours) at 06/29/17 1325 Last data filed at 06/29/17 1020  Gross per 24 hour  Intake          1039.89 ml  Output             1375 ml  Net          -335.11 ml   Intake/Output: I/O last 3 completed shifts: In: 1967.5 [P.O.:1440; I.V.:217.5; IV Piggyback:310] Out: 2130 [Urine:3525]  Intake/Output this shift:  Total I/O In: 300 [P.O.:300] Out: -  Weight change: -0.617 kg (-1 lb 5.8 oz) Gen:NAD CVS:IRR IRR no rub Resp:bibasilar crackles Abd:+BS, soft Ext:1-2+ edema   Recent Labs Lab 06/25/17 1252 06/25/17 1300 06/26/17 0422 06/27/17 0337 06/28/17 0302 06/29/17 0242  NA  --  137 138 138 137 135  K  --  3.8 3.7 3.8 3.5 3.6  CL  --  103 103 103 102 98*  CO2  --  25 26 26 27 27   GLUCOSE  --  145* 127* 139* 139* 151*  BUN  --  55* 55* 52* 51* 53*  CREATININE  --  3.02* 2.83* 2.72* 2.68* 2.73*  ALBUMIN 3.0*  --   --   --   --   --   CALCIUM  --  8.6* 8.5* 8.3* 8.3* 8.4*  AST 31  --   --   --   --   --   ALT 13*  --   --   --   --   --    Liver Function Tests:  Recent Labs Lab 06/25/17 1252  AST 31  ALT 13*  ALKPHOS 113  BILITOT 1.8*  PROT 7.2  ALBUMIN 3.0*   No results for input(s): LIPASE, AMYLASE in the last 168 hours. No results for input(s): AMMONIA in the last 168 hours. CBC:  Recent Labs Lab 06/25/17 1300 06/26/17 0422 06/27/17 0337 06/28/17 0302 06/29/17 0242  WBC 4.2 3.6* 3.6* 4.9 5.1  HGB 8.0* 7.8* 7.5* 7.3* 7.3*  HCT 24.7* 23.9* 22.4* 22.3* 21.8*  MCV 75.5* 75.4* 75.2* 75.1* 74.1*  PLT 177 181 173 163 153   Cardiac Enzymes: No results for input(s): CKTOTAL, CKMB, CKMBINDEX, TROPONINI in the last 168 hours. CBG:  Recent Labs Lab 06/28/17 0756  06/28/17 1130 06/28/17 1724 06/28/17 2105 06/29/17 0748  GLUCAP 112* 191* 259* 265* 117*    Iron Studies:  Recent Labs  06/27/17 1102  IRON 113  TIBC 473*  FERRITIN 31   Studies/Results: No results found. Marland Kitchen amiodarone  400 mg Oral BID  . atorvastatin  20 mg Oral Daily  . carvedilol  6.25 mg Oral BID WC  . ferrous sulfate  325 mg Oral BID WC  . folic acid  1 mg Oral Daily  . insulin aspart  0-5 Units Subcutaneous QHS  . insulin aspart  0-9 Units Subcutaneous TID WC  . insulin detemir  10 Units Subcutaneous Q2200  . metolazone  5 mg Oral BID  . pantoprazole  40 mg Oral Daily  . predniSONE  40 mg Oral Q breakfast  . sodium chloride  2 spray Each Nare BID  BMET    Component Value Date/Time   NA 135 06/29/2017 0242   NA 140 03/30/2017 1406   K 3.6 06/29/2017 0242   CL 98 (L) 06/29/2017 0242   CO2 27 06/29/2017 0242   GLUCOSE 151 (H) 06/29/2017 0242   BUN 53 (H) 06/29/2017 0242   BUN 33 (H) 03/30/2017 1406   CREATININE 2.73 (H) 06/29/2017 0242   CREATININE 1.61 (H) 01/16/2017 1653   CALCIUM 8.4 (L) 06/29/2017 0242   GFRNONAA 24 (L) 06/29/2017 0242   GFRNONAA 47 (L) 01/16/2017 1653   GFRAA 28 (L) 06/29/2017 0242   GFRAA 54 (L) 01/16/2017 1653   CBC    Component Value Date/Time   WBC 5.1 06/29/2017 0242   RBC 2.94 (L) 06/29/2017 0242   HGB 7.3 (L) 06/29/2017 0242   HCT 21.8 (L) 06/29/2017 0242   PLT 153 06/29/2017 0242   MCV 74.1 (L) 06/29/2017 0242   MCH 24.8 (L) 06/29/2017 0242   MCHC 33.5 06/29/2017 0242   RDW 18.9 (H) 06/29/2017 0242   LYMPHSABS 1.1 02/21/2017 1846   MONOABS 0.7 02/21/2017 1846   EOSABS 0.3 02/21/2017 1846   BASOSABS 0.1 02/21/2017 1846     Assessment/Plan:  1. 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  Scr has actually been slowly improving.  Continue to follow 2. Acute on chronic CHF with RV failure- s/p right heart cath today with worsening  RV failure and pulmonary HTN as well as elevated LVEDFP.  Heart failure team has pt on milrinone.  He has been diuresing with lasix 120mg  tid and metolazone.  Continue to follow I's/O's, daily weights, and Scr. 3. Atrial fib- persistent 4. Anemia- microcytic.  Iron stores acceptable, will need to initiate ESA. 5. Bioprosthetic MV- stable    [name], MD [facility] [phone]

## 2017-06-29 NOTE — Progress Notes (Signed)
ANTICOAGULATION CONSULT NOTE   Pharmacy Consult for warfarin Indication: atrial fibrillation  No Known Allergies  Labs:  Recent Labs  06/27/17 0337 06/28/17 0302 06/29/17 0242  HGB 7.5* 7.3* 7.3*  HCT 22.4* 22.3* 21.8*  PLT 173 163 153  LABPROT 29.6* 29.5* 28.4*  INR 2.74 2.73 2.60  CREATININE 2.72* 2.68* 2.73*    Estimated Creatinine Clearance: 31 mL/min (A) (by C-G formula based on SCr of 2.73 mg/dL (H)).   Medical History: Past Medical History:  Diagnosis Date  . Anemia of chronic disease   . Asthma    said he does not know  . Chronic kidney disease (CKD), stage IV (severe) ([place])   . Chronic systolic CHF (congestive heart failure) (HCC)    a. prior EF normal; b. Echo 10/16: Mild LVH, EF 30-35%, mitral valve bioprosthesis present without evidence of stenosis or regurgitation, severe LAE, mild RVE with mild to moderately reduced RVSF, moderate RAE  . Coronary artery disease   . Diabetes mellitus type 2 in obese ([place])   . Diabetes mellitus without complication ([place])   . Dyspnea   . GERD (gastroesophageal reflux disease)   . H/O tricuspid valve repair 2012  . History of echocardiogram    Echo at [facility] 4/12: mod LVH, EF 60-65%, mod LAE, tissue MVR ok, mod TR, mod pulmo HTN with RVSP 48 mmHg  . Hypertension   . Obstructive sleep apnea   . Paroxysmal atrial fibrillation (HCC)    s/p Maze procedure at time of MVR in 2011  . Pulmonary HTN ([place])   . S/P mitral valve replacement with porcine valve 2012    Assessment: Pt on warfarin PTA for afib/MVR (bioprosthetic).   INR this am is 2.6, now s/p RHC indicating RV failure and good output on milrinone. Orders to continue warfarin at this time.  Consider heparin if INR drops to < 2.   PTA warfarin dose was 2.5mg  daily  Goal of Therapy:  INR 2-3  Monitor platelets by anticoagulation protocol: Yes   Plan:  -Warfarin 2.5mg  tonight -Daily INR  Erin Hearing PharmD., BCPS Clinical Pharmacist Pager  [phone] 06/29/2017 3:23 PM

## 2017-06-29 NOTE — Progress Notes (Addendum)
PROGRESS NOTE   Michael [name]  AOZ:[id number]    DOB: 08/03/1961    DOA: 06/25/2017  PCP: [name], MD   I have briefly reviewed patients previous medical records in [facility].  Brief Narrative:  56 year old male with a PMH of type II DM, HTN, HLD, COPD, CAD, pulmonary hypertension, OSA, DM, mitral valve replacement with porcine valve, tricuspid valve repair, paroxysmal A. fib on Coumadin anticoagulation, chronic systolic CHF but now with improved EF, stage IV chronic kidney disease, cirrhosis, gout, recent hospitalization 05/30/17-06/02/17 for decompensated CHF and anasarca, seen by PCP on day of admission for worsening dyspnea, leg edema despite reported compliance with Lasix 80 mg twice a day but ongoing salt intake and gained 20 pounds in the last 3 weeks, referred to ED for admission.Anasarca, multifactorial related to pulmonary hypertension, right heart failure, cirrhosis, chronic kidney disease and hypoalbuminemia. Cardiology consulted, milrinone drip initiated, transferred to stepdown 8/10. Nephrology consulted 8/11.Status post right heart cath 8/13. Very slow to progress. Need several days inpatient care.   Assessment & Plan:   Principal Problem:   Acute kidney injury ([place]) Active Problems:   CKD (chronic kidney disease), stage IV (HCC)   DM type 2 (diabetes mellitus, type 2) (HCC)   Essential hypertension   Chronic systolic CHF (congestive heart failure) (HCC)   Gout   Chronic atrial fibrillation (HCC)   Anasarca   Acute on chronic combined systolic and diastolic CHF (congestive heart failure) ([place])   1. Acute on chronic diastolic CHF/predominantly right heart failure: 2-D echo 04/08/17 showed EF 35-40 percent but repeat echo 06/01/17 showed EF 50-55 percent, PA pressure 55 mmHg. Recent hospitalization for decompensated CHF. Likely precipitated by dietary indiscretion. Counseled extensively regarding all aspects of CHF care including salt restriction. Daily weights,  strict intake output. Cardiology following. Suspect etiology to be multifactorial including pulmonary hypertension, predominantly right heart failure. Other contributors include chronic kidney disease, cirrhosis and hypoalbuminemia. Cardiology continues to assist with management. Status post right heart cath 8/13 which shows predominantly right heart failure. Lasix increased to 160 mg IV every 8 hours, continue milrinone drip, continue metolazone. Diuresis thus far not satisfactory: -2 L since admission & no change in weight. Remains markedly volume overloaded. If unable to adequately diurese, may need CVVH. 2. Anasarca: Evaluation and management as per problem #1. 3. Persistent A. Fib: Cardiology follow-up appreciated. Has been in A. fib for greater than 1 year. Carvedilol decreased to 6.25 MG twice a day and amiodarone increased to 400 MG bid to help with rate control and keep an NSR after DCCV. Coumadin was held for right heart cath and as per my discussion with Dr. Aundra Dubin on 8/13, continue to hold Coumadin for now and start heparin if INR <2. Cardiology suspects that DCCV will not keep him in sinus rhythm given the prolonged duration of A. fib, but worth a try and plan TEE guided DCCV when volume is better controlled and off milrinone.. 4. Acute on Stage IV chronic kidney disease: Creatinine at recent discharge was 2.17. Baseline creatinine probably in the 1.8-2.4 range. Presented with creatinine of 3.02. Likely related to poor perfusion from CHF/cardiorenal syndrome-multiple episodes of admission for same in the past few years and typically got better with diuresis. No hydronephrosis on renal ultrasound. Creatinine stable over the last 4 days. Nephrology input appreciated. Increasing IV Lasix and continue Zaroxolyn. At risk of requiring dialysis if edema can't be controlled with medications. 5. Type II DM with renal complications: N6E 5.5. Currently on Levemir  and SSI. Hyperglycemia slightly worse due to  prednisone. Gout. Adjust insulins as needed. 6. Essential hypertension: Controlled. 7. Cirrhosis: Fluid management as above. Some ascites but no urgent need for paracentesis. Outpatient follow-up with [facility]. 8. Hyperlipidemia: Statins. 9. GERD: PPI. 10. Microcytic anemia/iron deficiency: Serum iron 28, ferritin 38 in April 2017. Iron supplements. Follow CBCs closely and transfuse if hemoglobin less than 7 g per DL. No overt bleeding. Check FOBT. May need to check erythropoietin levels >defer to nephrology. Repeat anemia panel: Iron 113, TIBC 473, saturation 24, ferritin 31, folate 45, B12: 934. Iron supplements started. 11. Status post porcine MVR and tricuspid valve repair. 12. Acute gout: Bilateral great toes, left fourth toe and right elbow. Avoiding NSAIDs and colchicine due to kidney disease. Start prednisone 40 mg daily and add Dilaudid for pain control. Low-grade fevers likely related to gout flare. However patient overall is immunocompromised and hence will get a set of blood cultures but monitor off of antibiotics. Much improved. Continue prednisone. 13. Left nostril epistaxis: Clear etiology. Remains anticoagulated despite holding Coumadin/INR 2.73. Counseled not to blow nose. Monitor closely. No further episodes reported.   DVT prophylaxis: Coumadin was held for right heart cath. INR still remains therapeutic at 2.6. Resume Coumadin per pharmacy. Code Status: Full Family Communication: None at bedside Disposition: Initially admitted to medical floor. Transferred to stepdown unit on 8/11. DC home when medically improved. Still has long way to go before he is medically stable for discharge.   Consultants:  Cardiology  Nephrology   Procedures:  Procedures 06/29/17  RIGHT HEART CATH  Conclusion   1. Elevated right and left heart filling pressures, more prominent RV failure.  2. Pulmonary venous hypertension.  3. Cardiac output vigorous on milrinone.      Antimicrobials:    None    Subjective: Seen this morning after right heart cath. No further epistaxis. Pain in both his big toes and right elbow has significantly improved. Reports dyspnea on minimal exertion and orthopnea. No chest pain. Indicates that swelling is slowly decreasing.  ROS: No dizziness or lightheadedness or palpitations. No bleeding or melena reported.  Objective:  Vitals:   06/29/17 0255 06/29/17 0606 06/29/17 0700 06/29/17 0807  BP: 95/63     Pulse: 86     Resp: 13     Temp: 98.2 F (36.8 C)  97.7 F (36.5 C)   TempSrc: Oral  Oral   SpO2: 95%   96%  Weight:  91.1 kg (200 lb 13.4 oz)    Height:        Examination:  General exam: Pleasant middle-aged male, moderately built and obese, ill-looking, sitting up in bed getting ready to eat breakfast. Looks much improved compared to yesterday.Marland Kitchen Respiratory system: Diminished breath sounds in the bases with few bibasal crackles. Rest of lung fields clear to auscultation. No increased work of breathing.  Cardiovascular system: S1 and S2 heard, irregularly irregular. JVD +. Grade 3 x 6 systolic murmur at left lower sternal edge. 3+ pitting bilateral leg edema extending to anterior abdominal wall and mid back. Gastrointestinal system: Abdomen is mildly distended, soft and nontender. No organomegaly or masses felt. Normal bowel sounds heard. Ascites +. Anterior abdominal wall edema. Stable. Central nervous system: Alert and oriented. No focal neurological deficits. No change/stable. Extremities: Symmetric 5 x 5 power. Bilateral toes, left fourth toe and right elbow with tenderness, painful range of movements but no significant swelling other than edema. Mild increase in warmth. No redness. All these acute gout  findings of significantly improved today. Skin: No rashes, lesions or ulcers Psychiatry: Judgement and insight appear normal. Mood & affect appropriate.     Data Reviewed: I have personally reviewed following labs and imaging  studies  CBC:  Recent Labs Lab 06/25/17 1300 06/26/17 0422 06/27/17 0337 06/28/17 0302 06/29/17 0242  WBC 4.2 3.6* 3.6* 4.9 5.1  HGB 8.0* 7.8* 7.5* 7.3* 7.3*  HCT 24.7* 23.9* 22.4* 22.3* 21.8*  MCV 75.5* 75.4* 75.2* 75.1* 74.1*  PLT 177 181 173 163 063   Basic Metabolic Panel:  Recent Labs Lab 06/25/17 1300 06/26/17 0422 06/27/17 0337 06/28/17 0302 06/29/17 0242  NA 137 138 138 137 135  K 3.8 3.7 3.8 3.5 3.6  CL 103 103 103 102 98*  CO2 25 26 26 27 27   GLUCOSE 145* 127* 139* 139* 151*  BUN 55* 55* 52* 51* 53*  CREATININE 3.02* 2.83* 2.72* 2.68* 2.73*  CALCIUM 8.6* 8.5* 8.3* 8.3* 8.4*  MG  --   --  2.9*  --   --    Liver Function Tests:  Recent Labs Lab 06/25/17 1252  AST 31  ALT 13*  ALKPHOS 113  BILITOT 1.8*  PROT 7.2  ALBUMIN 3.0*   Coagulation Profile:  Recent Labs Lab 06/25/17 1252 06/26/17 0422 06/27/17 0337 06/28/17 0302 06/29/17 0242  INR 2.44 2.45 2.74 2.73 2.60   Cardiac Enzymes: No results for input(s): CKTOTAL, CKMB, CKMBINDEX, TROPONINI in the last 168 hours. HbA1C: No results for input(s): HGBA1C in the last 72 hours. CBG:  Recent Labs Lab 06/28/17 0756 06/28/17 1130 06/28/17 1724 06/28/17 2105 06/29/17 0748  GLUCAP 112* 191* 259* 265* 117*    Recent Results (from the past 240 hour(s))  MRSA PCR Screening     Status: None   Collection Time: 06/26/17 10:03 PM  Result Value Ref Range Status   MRSA by PCR NEGATIVE NEGATIVE Final    Comment:        The GeneXpert MRSA Assay (FDA approved for NASAL specimens only), is one component of a comprehensive MRSA colonization surveillance program. It is not intended to diagnose MRSA infection nor to guide or monitor treatment for MRSA infections.          Radiology Studies: No results found.      Scheduled Meds: . amiodarone  400 mg Oral BID  . atorvastatin  20 mg Oral Daily  . carvedilol  6.25 mg Oral BID WC  . ferrous sulfate  325 mg Oral BID WC  . folic  acid  1 mg Oral Daily  . insulin aspart  0-5 Units Subcutaneous QHS  . insulin aspart  0-9 Units Subcutaneous TID WC  . insulin detemir  10 Units Subcutaneous Q2200  . metolazone  5 mg Oral BID  . pantoprazole  40 mg Oral Daily  . predniSONE  40 mg Oral Q breakfast  . sodium chloride  2 spray Each Nare BID   Continuous Infusions: . furosemide 160 mg (06/29/17 0941)  . milrinone 0.25 mcg/kg/min (06/29/17 0305)     LOS: 2 days     Huy Majid, MD, FACP, FHM. [facility] Pager [phone]  If 7PM-7AM, please contact night-coverage [email or web address] Password [facility] 06/29/2017, 9:56 AM

## 2017-06-29 NOTE — Progress Notes (Signed)
ANTICOAGULATION CONSULT NOTE   Pharmacy Consult for warfarin>> heparin Indication: atrial fibrillation  No Known Allergies  Labs:  Recent Labs  06/27/17 0337 06/28/17 0302 06/29/17 0242  HGB 7.5* 7.3* 7.3*  HCT 22.4* 22.3* 21.8*  PLT 173 163 153  LABPROT 29.6* 29.5* 28.4*  INR 2.74 2.73 2.60  CREATININE 2.72* 2.68* 2.73*    Estimated Creatinine Clearance: 31 mL/min (A) (by C-G formula based on SCr of 2.73 mg/dL (H)).   Medical History: Past Medical History:  Diagnosis Date  . Anemia of chronic disease   . Asthma    said he does not know  . Chronic kidney disease (CKD), stage IV (severe) ([place])   . Chronic systolic CHF (congestive heart failure) (HCC)    a. prior EF normal; b. Echo 10/16: Mild LVH, EF 30-35%, mitral valve bioprosthesis present without evidence of stenosis or regurgitation, severe LAE, mild RVE with mild to moderately reduced RVSF, moderate RAE  . Coronary artery disease   . Diabetes mellitus type 2 in obese ([place])   . Diabetes mellitus without complication ([place])   . Dyspnea   . GERD (gastroesophageal reflux disease)   . H/O tricuspid valve repair 2012  . History of echocardiogram    Echo at [facility] 4/12: mod LVH, EF 60-65%, mod LAE, tissue MVR ok, mod TR, mod pulmo HTN with RVSP 48 mmHg  . Hypertension   . Obstructive sleep apnea   . Paroxysmal atrial fibrillation (HCC)    s/p Maze procedure at time of MVR in 2011  . Pulmonary HTN ([place])   . 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   INR today remains elevated at 2.6, Hgb low at 7.3 but this has been stable. Last dose of warfarin was 8/10.   Goal of Therapy:  INR 2-3  Heparin Level 0.3-0.7 Monitor platelets by anticoagulation protocol: Yes   Plan:  -Hold warfarin for now -Initiate heparin once INR < 2 -Daily INR  Erin Hearing PharmD., BCPS Clinical Pharmacist Pager [phone] 06/29/2017 10:29 AM

## 2017-06-29 NOTE — Progress Notes (Signed)
Inpatient Diabetes Program Recommendations  AACE/ADA: New Consensus Statement on Inpatient Glycemic Control (2015)  Target Ranges:  Prepandial:   less than 140 mg/dL      Peak postprandial:   less than 180 mg/dL (1-2 hours)      Critically ill patients:  140 - 180 mg/dL   Results for Michael [name], Michael [name] (MRN [id number]) as of 06/29/2017 10:57  Ref. Range 06/28/2017 07:56 06/28/2017 11:30 06/28/2017 17:24 06/28/2017 21:05 06/29/2017 07:48  Glucose-Capillary Latest Ref Range: 65 - 99 mg/dL 112 (H) 191 (H) 259 (H) 265 (H) 117 (H)   Review of Glycemic Control  Current orders for Inpatient glycemic control: Levemir 10 units QHS, Novolog 0-9 units TID with meals, Novolog 0-5 units QHS  Inpatient Diabetes Program Recommendations: Insulin - Meal Coverage: If steroids are increased, please consider ordering Novolog 2 units TID with meals for meal coverage if patient eats at lest 50% of meals.  Thanks, Barnie Alderman, RN, MSN, CDE Diabetes Coordinator Inpatient Diabetes Program [phone] (Team Pager from 8am to 5pm)

## 2017-06-29 NOTE — Progress Notes (Signed)
Called daughter on both her numbers from work phone and from patients' personal cell phone and no answered. I also texted the daughter from the patients' phone per his request and still no reply.

## 2017-06-29 NOTE — Progress Notes (Addendum)
Patient ID: Michael [name], male   DOB: 19-Mar-1961, 56 y.o.   MRN: [id number]     Advanced Heart Failure Rounding Note  Primary Cardiologist: Dr. Harrington Challenger HF Cardiology: Luanna Cole  Subjective:    Remains on 120 mg IV Lasix TID and milrinone. Weight down 2 pounds overnight. But not much change in weight overall. Creatinine 3.02->2.83->2.72->2.68->2.73.   Feels fatigued today. Endorses orthopnea, SOB with any activity.   Echo (7/18): EF 50-55%, mild LVH, mild AI, bioprosthetic mitral valve appeared to function normally, RV moderately dilated with moderately decreased systolic function, D-shaped interventricular septum suggestive of RV pressure/volume overload, severe TR, PASP 54 mmHg  Objective:   Weight Range: 200 lb 13.4 oz (91.1 kg) Body mass index is 33.42 kg/m.   Vital Signs:   Temp:  [98 F (36.7 C)-99.7 F (37.6 C)] 98.2 F (36.8 C) (08/13 0255) Pulse Rate:  [72-108] 86 (08/13 0255) Resp:  [13-21] 13 (08/13 0255) BP: (83-109)/(61-79) 95/63 (08/13 0255) SpO2:  [95 %-97 %] 95 % (08/13 0255) Weight:  [200 lb 13.4 oz (91.1 kg)] 200 lb 13.4 oz (91.1 kg) (08/13 0606) Last BM Date: 06/26/17  Weight change: Filed Weights   06/27/17 0500 06/28/17 0500 06/29/17 0606  Weight: 203 lb 7.8 oz (92.3 kg) 202 lb 3.2 oz (91.7 kg) 200 lb 13.4 oz (91.1 kg)    Intake/Output:   Intake/Output Summary (Last 24 hours) at 06/29/17 0726 Last data filed at 06/29/17 2992  Gross per 24 hour  Intake          1281.89 ml  Output             2325 ml  Net         -1043.11 ml      Physical Exam    General:Ill appearing male, NAD.  HEENT: Normal Neck: Supple. JVP to jaw with prominent CV wave. Carotids 2+ bilat; no bruits. No thyromegaly or nodule noted. Cor: PMI nondisplaced. Irregular rate and rhythm. 3/6 HSM at [place].  Lungs: CTAB, normal effort. Abdomen: Soft, non-tender, non-distended, no HSM. No bruits or masses. +BS  Extremities: No cyanosis, clubbing, rash. 2+ edema to knees  bilaterally.  Neuro: Alert & orientedx3, cranial nerves grossly intact. moves all 4 extremities w/o difficulty. Affect pleasant   Telemetry   Atrial fibrillation - rates in the 90's. - Personally reviewed.   Labs    CBC  Recent Labs  06/28/17 0302 06/29/17 0242  WBC 4.9 5.1  HGB 7.3* 7.3*  HCT 22.3* 21.8*  MCV 75.1* 74.1*  PLT 163 426   Basic Metabolic Panel  Recent Labs  06/27/17 0337 06/28/17 0302 06/29/17 0242  NA 138 137 135  K 3.8 3.5 3.6  CL 103 102 98*  CO2 26 27 27   GLUCOSE 139* 139* 151*  BUN 52* 51* 53*  CREATININE 2.72* 2.68* 2.73*  CALCIUM 8.3* 8.3* 8.4*  MG 2.9*  --   --    Liver Function Tests No results for input(s): AST, ALT, ALKPHOS, BILITOT, PROT, ALBUMIN in the last 72 hours. No results for input(s): LIPASE, AMYLASE in the last 72 hours. Cardiac Enzymes No results for input(s): CKTOTAL, CKMB, CKMBINDEX, TROPONINI in the last 72 hours.  BNP: BNP (last 3 results)  Recent Labs  10/05/16 0147 05/30/17 1208 06/25/17 1253  BNP 237.4* 1,404.9* 875.7*    ProBNP (last 3 results)  Recent Labs  03/16/17 1401  PROBNP 6,530*     D-Dimer No results for input(s): DDIMER in the last 72 hours. Hemoglobin  A1C No results for input(s): HGBA1C in the last 72 hours. Fasting Lipid Panel No results for input(s): CHOL, HDL, LDLCALC, TRIG, CHOLHDL, LDLDIRECT in the last 72 hours. Thyroid Function Tests No results for input(s): TSH, T4TOTAL, T3FREE, THYROIDAB in the last 72 hours.  Invalid input(s): FREET3  Other results:   Imaging   Transthoracic Echocardiography 06/01/17  Study Conclusions  - Left ventricle: The cavity size was normal. There was mild   concentric hypertrophy. Systolic function was normal. The   estimated ejection fraction was in the range of 50% to 55%. Wall   motion was normal; there were no regional wall motion   abnormalities. - Ventricular septum: The contour showed diastolic flattening. - Aortic valve:  Transvalvular velocity was minimally increased.   There was no stenosis. There was mild regurgitation. Peak   velocity (S): 201 cm/s. - Mitral valve: A bioprosthesis was present and functioning   normally. Mean gradient (D): 6 mm Hg. Valve area by pressure   half-time: 1.93 cm^2. - Left atrium: The atrium was severely dilated. - Right ventricle: The cavity size was moderately dilated. Wall   thickness was normal. Systolic function was moderately reduced. - Right atrium: The atrium was moderately dilated. - Tricuspid valve: There was severe regurgitation. - Pulmonary arteries: Systolic pressure was moderately increased.   PA peak pressure: 54 mm Hg (S).  Medications:     Scheduled Medications: . amiodarone  400 mg Oral BID  . atorvastatin  20 mg Oral Daily  . carvedilol  6.25 mg Oral BID WC  . ferrous sulfate  325 mg Oral BID WC  . folic acid  1 mg Oral Daily  . insulin aspart  0-5 Units Subcutaneous QHS  . insulin aspart  0-9 Units Subcutaneous TID WC  . insulin detemir  10 Units Subcutaneous Q2200  . metolazone  5 mg Oral BID  . pantoprazole  40 mg Oral Daily  . predniSONE  40 mg Oral Q breakfast  . sodium chloride  2 spray Each Nare BID  . sodium chloride flush  3 mL Intravenous Q12H    Infusions: . sodium chloride    . sodium chloride 10 mL/hr at 06/29/17 0641  . furosemide Stopped (06/28/17 2241)  . milrinone 0.25 mcg/kg/min (06/29/17 0305)    PRN Medications: sodium chloride, acetaminophen, HYDROmorphone (DILAUDID) injection, oxyCODONE, sodium chloride flush    Patient Profile   56 year old male with past medical history of primarily diastolic CHF with prominent RV failure, prior mitral valve replacement with porcine valve, history of tricuspid valve repair now with severe TR, persistent atrial fibrillation, chronic stage IV kidney disease, diabetes mellitus was admitted with acute on chronic diastolic CHF. Patient underwent mitral valve replacement and tricuspid  valve repair in [place] in 2012.    Assessment/Plan   1. Acute on chronic diastolic CHF: EF 84-69% on echo in 7/18 with prominent RV failure (enlarged/hypokinetic RV with D-shaped interventricular septum).  Per Dr. Aundra Dubin - He has severe TR.  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  Milrinone was started at admission for RV support.  - Remains volume overloaded on exam. For RHC today to assess further.  - Continue milrinone  0.25 mcg for RV support.  - Continue Lasix 120 mg TID and metolazone today.   2. AKI on CKD stage IV: Creatinine stabilized on milrinone.  - Continue to monitor with BMET.   3. Atrial fibrillation: Persistent.  Per Dr. Aundra Dubin - Has been in atrial fibrillation for a number of months it appears.  Mild RVR.  I am not sure how well he will stay in NSR given severe TR and RV failure (even with amiodarone), but given the severity of his HF, I think that it is worth a trial.  - Cover with heparin gtt if INR drops < 2.   - Continue COreg 6.25 mg BID - Continue Amio 400 mg BID.  - Would aim for TEE-guided DCCV when volume is better controlled and milrinone titrated off.   4. Anemia: No overt GI bleeding, hgb stably low.  Not Fe or B12 deficient.  Await FOBT.  ?Anemia of chronic disease/renal disease.  Would likely benefit from Aranesp as outpatient.  - Hgb 7.3 today, stable.   5. Bioprosthetic mitral valve: Stable appearance on 7/18 echo.  - No change.   6. Probable sleep apnea: Will need sleep study outpatient.   Length of Stay: [place], NP  06/29/2017, 7:26 AM  Advanced Heart Failure Team Pager [phone] (M-F; 7a - 4p)  Please contact [facility] for night-coverage after hours (4p -7a ) and weekends on [email or web address]  Patient seen with NP, agree with the above note.  RHC findings from today below:   [place] Procedural Findings: Hemodynamics (mmHg) RA mean  27 RV 63/8, mean 20 PA 58/28, mean 40 PCWP mean 28 Oxygen saturations: PA 69% AO 96% Cardiac Output (Fick) 9.82  Cardiac Index (Fick) 4.96 PVR 1.2 WU  He remains markedly volume overloaded with R>L heart failure and pulmonary venous hypertension.  Cardiac output is vigorous on milrinone.  Creatinine stable with diuresis so far but needs a lot more fluid out.  - Will increase Lasix to 160 mg IV every 8 hrs and continue metolazone.  - He may end up needing CVVH for volume removal if he continues to diurese poorly.   Would be reasonable to eventually attempt TEE-guided DCCV.  Continue warfarin and amiodarone.  Would get fluid off and get him off milrinone prior to trying DCCV.  He is loading on amiodarone.  HR is reasonably controlled.   [name] 06/29/2017 8:43 AM

## 2017-06-29 NOTE — Interval H&P Note (Signed)
History and Physical Interval Note:  06/29/2017 8:13 AM  Michael [name]  has presented today for surgery, with the diagnosis of HF  The various methods of treatment have been discussed with the patient and family. After consideration of risks, benefits and other options for treatment, the patient has consented to  Procedure(s): RIGHT HEART CATH (N/A) as a surgical intervention .  The patient's history has been reviewed, patient examined, no change in status, stable for surgery.  I have reviewed the patient's chart and labs.  Questions were answered to the patient's satisfaction.     Lita Flynn [employer]

## 2017-06-29 NOTE — H&P (View-Only) (Signed)
Patient ID: Michael [name], male   DOB: January 08, 1961, 56 y.o.   MRN: [id number]     Advanced Heart Failure Rounding Note  Primary Cardiologist: Dr. Harrington Challenger HF Cardiology: Luanna Cole  Subjective:    Patient feels "bad" this morning, short of breath.  Weight is unchanged, diuresis was sluggish yesterday.  Creatinine is slightly better.  He remains on high dose IV Lasix and milrinone 0.25 for RV support.   Echo (7/18): EF 50-55%, mild LVH, mild AI, bioprosthetic mitral valve appeared to function normally, RV moderately dilated with moderately decreased systolic function, D-shaped interventricular septum suggestive of RV pressure/volume overload, severe TR, PASP 54 mmHg  Objective:   Weight Range: 202 lb 3.2 oz (91.7 kg) Body mass index is 33.65 kg/m.   Vital Signs:   Temp:  [98.2 F (36.8 C)-100 F (37.8 C)] 99.7 F (37.6 C) (08/12 0759) Pulse Rate:  [107-118] 110 (08/12 0317) Resp:  [15-23] 18 (08/12 0317) BP: (88-111)/(57-80) 111/78 (08/12 0317) SpO2:  [93 %-97 %] 96 % (08/12 0317) Weight:  [202 lb 3.2 oz (91.7 kg)] 202 lb 3.2 oz (91.7 kg) (08/12 0500) Last BM Date: 06/26/17  Weight change: Filed Weights   06/26/17 2104 06/27/17 0500 06/28/17 0500  Weight: 203 lb 6.4 oz (92.3 kg) 203 lb 7.8 oz (92.3 kg) 202 lb 3.2 oz (91.7 kg)    Intake/Output:   Intake/Output Summary (Last 24 hours) at 06/28/17 1032 Last data filed at 06/28/17 0800  Gross per 24 hour  Intake             1100 ml  Output             2100 ml  Net            -1000 ml      Physical Exam    General:  NAD HEENT: Normal Neck: Supple. JVP 16+ cm, prominent CV wave. No lymphadenopathy or thyromegaly appreciated. Cor: PMI nondisplaced. Mildly tachy, irregular rate & rhythm. 3/6 HSM LLSB.  No S3/S4.  Lungs: Clear bilaterally.  Abdomen: Soft, nontender, nondistended. No hepatosplenomegaly. No bruits or masses. Good bowel sounds. Extremities: No cyanosis, clubbing, rash.  2+ edema to knees bilaterally.    Neuro: Alert & orientedx3, cranial nerves grossly intact. moves all 4 extremities w/o difficulty. Affect pleasant   Telemetry   Personally reviewed, atrial fibrillation in 100s  Labs    CBC  Recent Labs  06/27/17 0337 06/28/17 0302  WBC 3.6* 4.9  HGB 7.5* 7.3*  HCT 22.4* 22.3*  MCV 75.2* 75.1*  PLT 173 409   Basic Metabolic Panel  Recent Labs  06/27/17 0337 06/28/17 0302  NA 138 137  K 3.8 3.5  CL 103 102  CO2 26 27  GLUCOSE 139* 139*  BUN 52* 51*  CREATININE 2.72* 2.68*  CALCIUM 8.3* 8.3*  MG 2.9*  --    Liver Function Tests  Recent Labs  06/25/17 1252  AST 31  ALT 13*  ALKPHOS 113  BILITOT 1.8*  PROT 7.2  ALBUMIN 3.0*   No results for input(s): LIPASE, AMYLASE in the last 72 hours. Cardiac Enzymes No results for input(s): CKTOTAL, CKMB, CKMBINDEX, TROPONINI in the last 72 hours.  BNP: BNP (last 3 results)  Recent Labs  10/05/16 0147 05/30/17 1208 06/25/17 1253  BNP 237.4* 1,404.9* 875.7*    ProBNP (last 3 results)  Recent Labs  03/16/17 1401  PROBNP 6,530*     D-Dimer No results for input(s): DDIMER in the last 72 hours. Hemoglobin A1C  No results for input(s): HGBA1C in the last 72 hours. Fasting Lipid Panel No results for input(s): CHOL, HDL, LDLCALC, TRIG, CHOLHDL, LDLDIRECT in the last 72 hours. Thyroid Function Tests No results for input(s): TSH, T4TOTAL, T3FREE, THYROIDAB in the last 72 hours.  Invalid input(s): FREET3  Other results:   Imaging     No results found.   Medications:     Scheduled Medications: . amiodarone  400 mg Oral BID  . atorvastatin  20 mg Oral Daily  . carvedilol  6.25 mg Oral BID WC  . ferrous sulfate  325 mg Oral BID WC  . folic acid  1 mg Oral Daily  . insulin aspart  0-5 Units Subcutaneous QHS  . insulin aspart  0-9 Units Subcutaneous TID WC  . insulin detemir  10 Units Subcutaneous Q2200  . metolazone  5 mg Oral BID  . pantoprazole  40 mg Oral Daily  . potassium chloride  40  mEq Oral Once  . predniSONE  40 mg Oral Q breakfast     Infusions: . furosemide 120 mg (06/28/17 0929)  . 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 Patient underwent mitral valve replacement and tricuspid valve repair in [place] in 2012.    Assessment/Plan   1. Acute on chronic diastolic CHF: EF 40-98% on echo in 7/18 with prominent RV failure (enlarged/hypokinetic RV with D-shaped interventricular septum).  He has severe TR.  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  He remains very volume overloaded.  Creatinine has stabilized but still not diuresing well.  He is on milrinone for RV support.  - I will arrange for RHC tomorrow to assess PA pressure and filling pressures.  Will aim for brachial access with elevated INR 2.7 (warfarin on hold).  Risks/benefits of procedure discussed with patient and he agrees to proceed.  - Continue milrinone at 0.25 mcg/kg/min for now for RV support.  - Needs diuresis => continue Lasix 120 mg IV tid and agree with renal's addition of metolazone 5 mg bid.  2. AKI on CKD stage IV: Creatinine has stabilized on milrinone, hopefullly will come down with diuresis and lowering of renal venous pressure.  3. Atrial fibrillation: Persistent.  Has been in atrial fibrillation for a number of months it appears.  Mild RVR.  I am not sure how well he will stay in NSR given severe TR and RV failure (even with amiodarone), but given the  severity of his HF, I think that it is worth a trial.  - Cover with heparin gtt if INR drops < 2.   - Decrease Coreg to 6.25 mg bid with soft BP and increase amiodarone to 400 mg bid (for both rate control and to help keep in NSR after DCCV).  - Would aim for TEE-guided DCCV when volume is better controlled and milrinone titrated off.  4. Anemia: No overt GI bleeding, hgb stably low.  Not Fe or B12 deficient.  Await FOBT.  ?Anemia of chronic disease/renal disease.  Would likely benefit from Aranesp as outpatient.  5. Bioprosthetic mitral valve: Stable appearance on 7/18 echo.   Length of Stay: 1  [name], MD  06/28/2017, 10:32 AM  Advanced Heart Failure Team Pager [phone] (M-F; 7a - 4p)  Please contact [facility] for night-coverage after hours (4p -7a ) and weekends on [email or web address]

## 2017-06-30 ENCOUNTER — Encounter (HOSPITAL_COMMUNITY): Payer: Self-pay

## 2017-06-30 ENCOUNTER — Inpatient Hospital Stay (HOSPITAL_COMMUNITY): Payer: Medicare Other

## 2017-06-30 ENCOUNTER — Encounter (HOSPITAL_COMMUNITY): Payer: Self-pay | Admitting: *Deleted

## 2017-06-30 LAB — BASIC METABOLIC PANEL
Anion gap: 11 (ref 5–15)
BUN: 62 mg/dL — AB (ref 6–20)
CALCIUM: 8.5 mg/dL — AB (ref 8.9–10.3)
CO2: 26 mmol/L (ref 22–32)
CREATININE: 3.31 mg/dL — AB (ref 0.61–1.24)
Chloride: 96 mmol/L — ABNORMAL LOW (ref 101–111)
GFR calc Af Amer: 22 mL/min — ABNORMAL LOW (ref >60)
GFR, EST NON AFRICAN AMERICAN: 19 mL/min — AB (ref >60)
GLUCOSE: 120 mg/dL — AB (ref 65–99)
Potassium: 4.2 mmol/L (ref 3.5–5.1)
SODIUM: 133 mmol/L — AB (ref 135–145)

## 2017-06-30 LAB — RENAL FUNCTION PANEL
Albumin: 2.9 g/dL — ABNORMAL LOW (ref 3.5–5.0)
Anion gap: 12 (ref 5–15)
BUN: 68 mg/dL — AB (ref 6–20)
CHLORIDE: 95 mmol/L — AB (ref 101–111)
CO2: 25 mmol/L (ref 22–32)
CREATININE: 3.39 mg/dL — AB (ref 0.61–1.24)
Calcium: 8.4 mg/dL — ABNORMAL LOW (ref 8.9–10.3)
GFR calc non Af Amer: 19 mL/min — ABNORMAL LOW (ref >60)
GFR, EST AFRICAN AMERICAN: 22 mL/min — AB (ref >60)
Glucose, Bld: 148 mg/dL — ABNORMAL HIGH (ref 65–99)
POTASSIUM: 4.3 mmol/L (ref 3.5–5.1)
Phosphorus: 4.7 mg/dL — ABNORMAL HIGH (ref 2.5–4.6)
Sodium: 132 mmol/L — ABNORMAL LOW (ref 135–145)

## 2017-06-30 LAB — GLUCOSE, CAPILLARY
GLUCOSE-CAPILLARY: 171 mg/dL — AB (ref 65–99)
Glucose-Capillary: 142 mg/dL — ABNORMAL HIGH (ref 65–99)
Glucose-Capillary: 151 mg/dL — ABNORMAL HIGH (ref 65–99)
Glucose-Capillary: 167 mg/dL — ABNORMAL HIGH (ref 65–99)

## 2017-06-30 LAB — CBC
HCT: 22.2 % — ABNORMAL LOW (ref 39.0–52.0)
Hemoglobin: 7.4 g/dL — ABNORMAL LOW (ref 13.0–17.0)
MCH: 24.6 pg — AB (ref 26.0–34.0)
MCHC: 33.3 g/dL (ref 30.0–36.0)
MCV: 73.8 fL — AB (ref 78.0–100.0)
PLATELETS: 165 10*3/uL (ref 150–400)
RBC: 3.01 MIL/uL — ABNORMAL LOW (ref 4.22–5.81)
RDW: 19 % — AB (ref 11.5–15.5)
WBC: 4.9 10*3/uL (ref 4.0–10.5)

## 2017-06-30 LAB — PROTIME-INR
INR: 2.42
PROTHROMBIN TIME: 26.8 s — AB (ref 11.4–15.2)

## 2017-06-30 LAB — TYPE AND SCREEN
ABO/RH(D): B POS
ANTIBODY SCREEN: NEGATIVE

## 2017-06-30 MED ORDER — PRISMASOL BGK 4/2.5 32-4-2.5 MEQ/L IV SOLN
INTRAVENOUS | Status: DC
  Administered 2017-06-30 – 2017-07-07 (×12): via INTRAVENOUS_CENTRAL
  Filled 2017-06-30 (×13): qty 5000

## 2017-06-30 MED ORDER — SODIUM CHLORIDE 0.9 % IV SOLN
INTRAVENOUS | Status: DC
  Administered 2017-06-30 – 2017-07-13 (×3): via INTRAVENOUS

## 2017-06-30 MED ORDER — SODIUM CHLORIDE 0.9% FLUSH
10.0000 mL | Freq: Two times a day (BID) | INTRAVENOUS | Status: DC
  Administered 2017-06-30 – 2017-07-13 (×15): 10 mL

## 2017-06-30 MED ORDER — SODIUM CHLORIDE 0.9% FLUSH
10.0000 mL | INTRAVENOUS | Status: DC | PRN
  Administered 2017-07-08: 10 mL
  Filled 2017-06-30: qty 40

## 2017-06-30 MED ORDER — CHLORHEXIDINE GLUCONATE CLOTH 2 % EX PADS
6.0000 | MEDICATED_PAD | Freq: Every day | CUTANEOUS | Status: DC
  Administered 2017-07-01 – 2017-07-10 (×9): 6 via TOPICAL

## 2017-06-30 MED ORDER — HEPARIN SODIUM (PORCINE) 1000 UNIT/ML DIALYSIS
1000.0000 [IU] | INTRAMUSCULAR | Status: DC | PRN
  Administered 2017-06-30 – 2017-07-07 (×2): 2400 [IU] via INTRAVENOUS_CENTRAL
  Filled 2017-06-30 (×2): qty 3
  Filled 2017-06-30: qty 1
  Filled 2017-06-30: qty 6

## 2017-06-30 MED ORDER — PRISMASOL BGK 4/2.5 32-4-2.5 MEQ/L IV SOLN
INTRAVENOUS | Status: DC
  Administered 2017-06-30 – 2017-07-07 (×29): via INTRAVENOUS_CENTRAL
  Filled 2017-06-30 (×44): qty 5000

## 2017-06-30 MED ORDER — WARFARIN - PHARMACIST DOSING INPATIENT
Freq: Every day | Status: DC
  Administered 2017-06-30: 18:00:00

## 2017-06-30 MED ORDER — WARFARIN SODIUM 2.5 MG PO TABS
2.5000 mg | ORAL_TABLET | Freq: Once | ORAL | Status: AC
  Administered 2017-06-30: 2.5 mg via ORAL
  Filled 2017-06-30: qty 1

## 2017-06-30 MED ORDER — HEPARIN (PORCINE) 2000 UNITS/L FOR CRRT
INTRAVENOUS_CENTRAL | Status: DC | PRN
  Administered 2017-07-06: 14:00:00 via INTRAVENOUS_CENTRAL
  Filled 2017-06-30: qty 1000

## 2017-06-30 MED ORDER — SODIUM CHLORIDE 0.9 % IV SOLN
Freq: Once | INTRAVENOUS | Status: AC
  Administered 2017-06-30: 09:00:00 via INTRAVENOUS

## 2017-06-30 MED ORDER — PRISMASOL BGK 4/2.5 32-4-2.5 MEQ/L IV SOLN
INTRAVENOUS | Status: DC
  Administered 2017-06-30 – 2017-07-07 (×9): via INTRAVENOUS_CENTRAL
  Filled 2017-06-30 (×13): qty 5000

## 2017-06-30 NOTE — Progress Notes (Signed)
PROGRESS NOTE   Michael [name]  TGY:[id number]    DOB: 03/10/1961    DOA: 06/25/2017  PCP: [name], MD   I have briefly reviewed patients previous medical records in [facility].  Brief Narrative:  56 year old male with a PMH of type II DM, HTN, HLD, COPD, CAD, pulmonary hypertension, OSA, DM, mitral valve replacement with porcine valve, tricuspid valve repair, paroxysmal A. fib on Coumadin anticoagulation, chronic systolic CHF but now with improved EF, stage IV chronic kidney disease, cirrhosis, gout, recent hospitalization 05/30/17-06/02/17 for decompensated CHF and anasarca, seen by PCP on day of admission for worsening dyspnea, leg edema despite reported compliance with Lasix 80 mg twice a day but ongoing salt intake and gained 20 pounds in the last 3 weeks, referred to ED for admission.Anasarca, multifactorial related to pulmonary hypertension, right heart failure, cirrhosis, chronic kidney disease and hypoalbuminemia. Cardiology consulted, milrinone drip initiated, transferred to stepdown 8/10. Nephrology consulted 8/11.Status post right heart cath 8/13. Very slow to progress. Need several days inpatient care. Starting CVVHD 8/14   Assessment & Plan:   Principal Problem:   Acute kidney injury ([place]) Active Problems:   CKD (chronic kidney disease), stage IV (HCC)   DM type 2 (diabetes mellitus, type 2) (HCC)   Essential hypertension   Chronic systolic CHF (congestive heart failure) (HCC)   Gout   Chronic atrial fibrillation (HCC)   Anasarca   Acute on chronic combined systolic and diastolic CHF (congestive heart failure) ([place])   1. Acute on chronic diastolic CHF/predominantly right heart failure: 2-D echo 04/08/17 showed EF 35-40 percent but repeat echo 06/01/17 showed EF 50-55 percent, PA pressure 55 mmHg. Recent hospitalization for decompensated CHF. Likely precipitated by dietary indiscretion. Counseled extensively regarding all aspects of CHF care including salt  restriction. Daily weights, strict intake output. Cardiology following. Suspect etiology to be multifactorial including pulmonary hypertension, predominantly right heart failure. Other contributors include chronic kidney disease, cirrhosis and hypoalbuminemia. Cardiology continues to assist with management. Status post right heart cath 8/13 which shows predominantly right heart failure. Lasix increased to 160 mg IV every 8 hours, continue milrinone drip, continue metolazone. Diuresis thus far not satisfactory: -2 L since admission & no change in weight. Remains markedly volume overloaded. Not enough UO. Creatinine rising. As per Nephrology and Cards, start CVVHD 8/14. 2. Anasarca: Evaluation and management as per problem #1. 3. Persistent A. Fib: Cardiology follow-up appreciated. Has been in A. fib for greater than 1 year. Carvedilol decreased to 6.25 MG twice a day and amiodarone increased to 400 MG bid to help with rate control and keep an NSR after DCCV. Coumadin was held for right heart cath and as per my discussion with Dr. Aundra Dubin on 8/13, continue to hold Coumadin for now and start heparin if INR <2. Cardiology suspects that DCCV will not keep him in sinus rhythm given the prolonged duration of A. fib, but worth a try and plan TEE guided DCCV when volume is better controlled and off milrinone.. 4. Acute on Stage IV chronic kidney disease: Creatinine at recent discharge was 2.17. Baseline creatinine probably in the 1.8-2.4 range. Presented with creatinine of 3.02. Likely related to poor perfusion from CHF/cardiorenal syndrome-multiple episodes of admission for same in the past few years and typically got better with diuresis. No hydronephrosis on renal ultrasound. Creatinine stable over the last 4 days. Nephrology input appreciated. Increasing IV Lasix and continue Zaroxolyn. Creatinine rising. Starting CVVHD. 5. Type II DM with renal complications: K8J 5.5. Currently on Levemir  and SSI. Hyperglycemia  slightly worse due to prednisone. Gout. Adjust insulins as needed. No changes today. 6. Essential hypertension: Controlled. 7. Cirrhosis: Fluid management as above. Some ascites but no urgent need for paracentesis. Outpatient follow-up with [facility]. 8. Hyperlipidemia: Statins. 9. GERD: PPI. 10. Microcytic anemia/iron deficiency: Serum iron 28, ferritin 38 in April 2017. Iron supplements. Follow CBCs closely and transfuse if hemoglobin less than 7 g per DL. No overt bleeding. Check FOBT. May need to check erythropoietin levels >defer to nephrology. Repeat anemia panel: Iron 113, TIBC 473, saturation 24, ferritin 31, folate 45, B12: 934. Iron supplements started. Aranesp per Nephrology. 11. Status post porcine MVR and tricuspid valve repair. 12. Acute gout: Bilateral great toes, left fourth toe and right elbow. Avoiding NSAIDs and colchicine due to kidney disease. Start prednisone 40 mg daily and add Dilaudid for pain control. Low-grade fevers likely related to gout flare. However patient overall is immunocompromised and hence will get a set of blood cultures but monitor off of antibiotics. Much improved. Continue prednisone. Stable. Continue same dose today and start tapering Prednisone in 1-2 days. 13. Left nostril epistaxis: Clear etiology. Remains anticoagulated despite holding Coumadin/INR 2.73. Counseled not to blow nose. Monitor closely. No further episodes reported.   DVT prophylaxis: Coumadin was held for right heart cath. INR still remains therapeutic at 2.42. AC per Cards recommendation. Code Status: Full Family Communication: None at bedside Disposition: Initially admitted to medical floor. Transferred to stepdown unit on 8/11. DC home when medically improved. Still has long way to go before he is medically stable for discharge.   Consultants:  Cardiology  Nephrology   Procedures:  Procedures 06/29/17  RIGHT HEART CATH  Conclusion   1. Elevated right and left heart filling  pressures, more prominent RV failure.  2. Pulmonary venous hypertension.  3. Cardiac output vigorous on milrinone.      Antimicrobials:  None    Subjective: Denies joint pains. Dyspnea on minimal exertion. No change in body swelling. Had BM yesterday.  ROS: No dizziness or lightheadedness or palpitations. No bleeding or melena reported.  Objective:  Vitals:   06/30/17 0434 06/30/17 0435 06/30/17 0752 06/30/17 1023  BP:  (!) 107/58 105/63 100/65  Pulse:  97 87 95  Resp:  (!) 30 15 18   Temp:  (!) 97.5 F (36.4 C) 97.8 F (36.6 C) 98 F (36.7 C)  TempSrc: Oral Oral Oral Oral  SpO2:  97% 98%   Weight:  93 kg (205 lb 0.4 oz)    Height:        Examination:  General exam: Pleasant middle-aged male, moderately built and obese, ill-looking, sitting up in bed. Respiratory system: Diminished breath sounds in the bases with few bibasal crackles. Rest of lung fields clear to auscultation. No increased work of breathing. No change. Cardiovascular system: S1 and S2 heard, irregularly irregular. JVD +. Grade 3 x 6 systolic murmur at left lower sternal edge. 3+ pitting bilateral leg edema extending to anterior abdominal wall and mid back. No change. Telemetry: A. fib with controlled ventricular rate in the 80s/90s. Gastrointestinal system: Abdomen is mildly distended, soft and nontender. No organomegaly or masses felt. Normal bowel sounds heard. Ascites +. Anterior abdominal wall edema. No significant change in volume status over the last several days. Central nervous system: Alert and oriented. No focal neurological deficits. No change/stable. Extremities: Symmetric 5 x 5 power. Bilateral toes, left fourth toe and right elbow with tenderness, painful range of movements but no significant swelling other  than edema. Mild increase in warmth. No redness. All these acute gout findings of significantly improved. Skin: No rashes, lesions or ulcers Psychiatry: Judgement and insight appear normal.  Mood & affect appropriate.     Data Reviewed: I have personally reviewed following labs and imaging studies  CBC:  Recent Labs Lab 06/26/17 0422 06/27/17 0337 06/28/17 0302 06/29/17 0242 06/30/17 0321  WBC 3.6* 3.6* 4.9 5.1 4.9  HGB 7.8* 7.5* 7.3* 7.3* 7.4*  HCT 23.9* 22.4* 22.3* 21.8* 22.2*  MCV 75.4* 75.2* 75.1* 74.1* 73.8*  PLT 181 173 163 153 614   Basic Metabolic Panel:  Recent Labs Lab 06/26/17 0422 06/27/17 0337 06/28/17 0302 06/29/17 0242 06/30/17 0321  NA 138 138 137 135 133*  K 3.7 3.8 3.5 3.6 4.2  CL 103 103 102 98* 96*  CO2 26 26 27 27 26   GLUCOSE 127* 139* 139* 151* 120*  BUN 55* 52* 51* 53* 62*  CREATININE 2.83* 2.72* 2.68* 2.73* 3.31*  CALCIUM 8.5* 8.3* 8.3* 8.4* 8.5*  MG  --  2.9*  --   --   --    Liver Function Tests:  Recent Labs Lab 06/25/17 1252  AST 31  ALT 13*  ALKPHOS 113  BILITOT 1.8*  PROT 7.2  ALBUMIN 3.0*   Coagulation Profile:  Recent Labs Lab 06/26/17 0422 06/27/17 0337 06/28/17 0302 06/29/17 0242 06/30/17 0321  INR 2.45 2.74 2.73 2.60 2.42   Cardiac Enzymes: No results for input(s): CKTOTAL, CKMB, CKMBINDEX, TROPONINI in the last 168 hours. HbA1C: No results for input(s): HGBA1C in the last 72 hours. CBG:  Recent Labs Lab 06/28/17 2105 06/29/17 0748 06/29/17 1715 06/29/17 2120 06/30/17 0756  GLUCAP 265* 117* 167* 201* 142*    Recent Results (from the past 240 hour(s))  MRSA PCR Screening     Status: None   Collection Time: 06/26/17 10:03 PM  Result Value Ref Range Status   MRSA by PCR NEGATIVE NEGATIVE Final    Comment:        The GeneXpert MRSA Assay (FDA approved for NASAL specimens only), is one component of a comprehensive MRSA colonization surveillance program. It is not intended to diagnose MRSA infection nor to guide or monitor treatment for MRSA infections.   Culture, blood (Routine X 2) w Reflex to ID Panel     Status: None (Preliminary result)   Collection Time: 06/28/17  9:36 AM    Result Value Ref Range Status   Specimen Description BLOOD RIGHT WRIST  Final   Special Requests   Final    BOTTLES DRAWN AEROBIC AND ANAEROBIC Blood Culture adequate volume   Culture NO GROWTH 1 DAY  Final   Report Status PENDING  Incomplete  Culture, blood (Routine X 2) w Reflex to ID Panel     Status: None (Preliminary result)   Collection Time: 06/28/17  9:40 AM  Result Value Ref Range Status   Specimen Description BLOOD LEFT WRIST  Final   Special Requests   Final    BOTTLES DRAWN AEROBIC AND ANAEROBIC Blood Culture adequate volume   Culture NO GROWTH 1 DAY  Final   Report Status PENDING  Incomplete         Radiology Studies: No results found.      Scheduled Meds: . amiodarone  400 mg Oral BID  . atorvastatin  20 mg Oral Daily  . carvedilol  6.25 mg Oral BID WC  . ferrous sulfate  325 mg Oral BID WC  . folic acid  1 mg  Oral Daily  . insulin aspart  0-5 Units Subcutaneous QHS  . insulin aspart  0-9 Units Subcutaneous TID WC  . insulin detemir  10 Units Subcutaneous Q2200  . metolazone  5 mg Oral BID  . pantoprazole  40 mg Oral Daily  . predniSONE  40 mg Oral Q breakfast  . sodium chloride  2 spray Each Nare BID   Continuous Infusions: . sodium chloride    . furosemide Stopped (06/29/17 2150)  . milrinone 0.25 mcg/kg/min (06/30/17 0816)     LOS: 3 days     Cael Worth, MD, FACP, FHM. [facility] Pager [phone]  If 7PM-7AM, please contact night-coverage [email or web address] Password [email or web address] 06/30/2017, 10:40 AM

## 2017-06-30 NOTE — Progress Notes (Signed)
CXR images reviewed, HD catheter in good position. [place] for use.    Noe Gens, NP-C [facility] Pgr: [phone] or if no answer [phone] 06/30/2017, 2:31 PM

## 2017-06-30 NOTE — Evaluation (Signed)
Physical Therapy Evaluation Patient Details Name: Michael [name] MRN: [id number] DOB: [birth year] Today's Date: 06/30/2017   History of Present Illness  HPI: Michael [name] is a 56 y.o. male with medical history significant of anemia of chronic disease, chronic kidney disease, systolic heart failure, diabetes mellitus, GERD, history of tricuspid valve repair, essential hypertension, atrial fibrillation, pulmonary hypertension. Patient presented from his PCPs office for evaluation of anasarca. Acute kidney injury as well with cardiorenal syndrome  Clinical Impression   Pt admitted with above diagnosis. Pt currently with functional limitations due to the deficits listed below (see PT Problem List). Walking independently prior to admission; has prn family/friend assist at home; Currently quite fatigued and weak; Pt will benefit from skilled PT to increase their independence and safety with mobility to allow discharge to the venue listed below.       Follow Up Recommendations Home health PT;Other (comment) (Will consider HHPT versus none closer to dc)    Equipment Recommendations  Other (comment) (Will consider RW versus cane versus none)    Recommendations for Other Services       Precautions / Restrictions Precautions Precautions: Fall Restrictions Weight Bearing Restrictions: No      Mobility  Bed Mobility Overal bed mobility: Needs Assistance Bed Mobility: Supine to Sit     Supine to sit: Supervision     General bed mobility comments: Supervision mostly for lines and leads  Transfers Overall transfer level: Needs assistance Equipment used: 1 person hand held assist Transfers: Sit to/from Stand Sit to Stand: Min guard         General transfer comment: Cues to self-monitor for activity tolerance  Ambulation/Gait Ambulation/Gait assistance: Min assist Ambulation Distance (Feet): 12 Feet Assistive device: 1 person hand held assist (and ;ushing IV pole) Gait  Pattern/deviations: Step-through pattern;Decreased step length - right;Decreased step length - left     General Gait Details: Cuse to self-monitor for activity tolerance; very tired post walk in room to chair  Stairs            Wheelchair Mobility    Modified Rankin (Stroke Patients Only)       Balance                                             Pertinent Vitals/Pain Pain Assessment: No/denies pain    Home Living Family/patient expects to be discharged to:: Private residence Living Arrangements: Alone Available Help at Discharge: Friend(s);Available PRN/intermittently Type of Home: Apartment Home Access: Stairs to enter Entrance Stairs-Rails: None Entrance Stairs-Number of Steps: 2 Home Layout: One level Home Equipment: Cane - single point Additional Comments: Per chart review, Son helps but works during the day.  Daughter lives in town as well.    Prior Function Level of Independence: Independent with assistive device(s)               Hand Dominance   Dominant Hand: Right    Extremity/Trunk Assessment   Upper Extremity Assessment Upper Extremity Assessment: Overall WFL for tasks assessed    Lower Extremity Assessment Lower Extremity Assessment: Generalized weakness    Cervical / Trunk Assessment Cervical / Trunk Assessment: Normal  Communication   Communication: No difficulties  Cognition Arousal/Alertness: Awake/alert Behavior During Therapy: WFL for tasks assessed/performed Overall Cognitive Status: Within Functional Limits for tasks assessed  General Comments General comments (skin integrity, edema, etc.): VSS on Room Air    Exercises     Assessment/Plan    PT Assessment Patient needs continued PT services  PT Problem List Decreased strength;Decreased activity tolerance;Decreased balance;Decreased mobility;Decreased coordination;Decreased cognition;Decreased  knowledge of use of DME;Decreased safety awareness;Decreased knowledge of precautions       PT Treatment Interventions DME instruction;Gait training;Stair training;Functional mobility training;Therapeutic activities;Therapeutic exercise;Balance training;Patient/family education    PT Goals (Current goals can be found in the Care Plan section)  Acute Rehab PT Goals Patient Stated Goal: hopes to get better soon PT Goal Formulation: With patient Time For Goal Achievement: 07/14/17 Potential to Achieve Goals: Good    Frequency Min 3X/week   Barriers to discharge Decreased caregiver support Needs to be independent to dc home    Co-evaluation               AM-PAC PT "6 Clicks" Daily Activity  Outcome Measure Difficulty turning over in bed (including adjusting bedclothes, sheets and blankets)?: None Difficulty moving from lying on back to sitting on the side of the bed? : A Little Difficulty sitting down on and standing up from a chair with arms (e.g., wheelchair, bedside commode, etc,.)?: A Little Help needed moving to and from a bed to chair (including a wheelchair)?: None Help needed walking in hospital room?: A Little Help needed climbing 3-5 steps with a railing? : A Little 6 Click Score: 20    End of Session Equipment Utilized During Treatment: Gait belt Activity Tolerance: Patient tolerated treatment well Patient left: in chair;with call bell/phone within reach;with family/visitor present Nurse Communication: Mobility status PT Visit Diagnosis: Unsteadiness on feet (R26.81);Muscle weakness (generalized) (M62.81)    Time: 5366-4403 PT Time Calculation (min) (ACUTE ONLY): 14 min   Charges:   PT Evaluation $PT Eval Moderate Complexity: 1 Mod     PT G Codes:        Michael [name], PT  Acute Rehabilitation Services Pager [phone] Office [phone]   Michael [name] 06/30/2017, 11:59 AM

## 2017-06-30 NOTE — Procedures (Signed)
Hemodialysis Catheter Insertion Procedure Note Ervin Groene [id number] 01/15/1961  Procedure: Insertion of Hemodialysis Catheter Indications: Hemodialysis  Procedure Details Consent: Risks of procedure as well as the alternatives and risks of each were explained to the (patient/caregiver).  Consent for procedure obtained.  Time Out: Verified patient identification, verified procedure, site/side was marked, verified correct patient position, special equipment/implants available, medications/allergies/relevent history reviewed, required imaging and test results available.  Performed  Maximum sterile technique was used including antiseptics, cap, gloves, gown, hand hygiene, mask and sheet.  Skin prep: Chlorhexidine; local anesthetic administered  A Trialysis HD catheter was placed in the right internal jugular vein using the Seldinger technique.  Evaluation Blood flow good Complications: No apparent complications Patient did tolerate procedure well. Chest X-ray ordered to verify placement.  CXR: pending.   Procedure performed under direct supervision of Dr. Ashok Cordia and with ultrasound guidance for real time vessel cannulation.     Noe Gens, NP-C [facility] Pgr: [phone] or [phone] 06/30/2017, 1:10 PM

## 2017-06-30 NOTE — Progress Notes (Addendum)
ANTICOAGULATION CONSULT NOTE   Pharmacy Consult for warfarin Indication: atrial fibrillation  No Known Allergies  Labs:  Recent Labs  06/28/17 0302 06/29/17 0242 06/30/17 0321  HGB 7.3* 7.3* 7.4*  HCT 22.3* 21.8* 22.2*  PLT 163 153 165  LABPROT 29.5* 28.4* 26.8*  INR 2.73 2.60 2.42  CREATININE 2.68* 2.73* 3.31*    Estimated Creatinine Clearance: 25.8 mL/min (A) (by C-G formula based on SCr of 3.31 mg/dL (H)).   Medical History: Past Medical History:  Diagnosis Date  . Anemia of chronic disease   . Asthma    said he does not know  . Chronic kidney disease (CKD), stage IV (severe) ([place])   . Chronic systolic CHF (congestive heart failure) (HCC)    a. prior EF normal; b. Echo 10/16: Mild LVH, EF 30-35%, mitral valve bioprosthesis present without evidence of stenosis or regurgitation, severe LAE, mild RVE with mild to moderately reduced RVSF, moderate RAE  . Coronary artery disease   . Diabetes mellitus type 2 in obese ([place])   . Diabetes mellitus without complication ([place])   . Dyspnea   . GERD (gastroesophageal reflux disease)   . H/O tricuspid valve repair 2012  . History of echocardiogram    Echo at [facility] in [facility] 4/12: mod LVH, EF 60-65%, mod LAE, tissue MVR ok, mod TR, mod pulmo HTN with RVSP 48 mmHg  . Hypertension   . Obstructive sleep apnea   . Paroxysmal atrial fibrillation (HCC)    s/p Maze procedure at time of MVR in 2011  . Pulmonary HTN ([place])   . S/P mitral valve replacement with porcine valve 2012    Assessment: Pt on warfarin PTA for afib/MVR (bioprosthetic).   INR this am is 2.4, now s/p RHC indicating RV failure and good cardiac output on milrinone. D/w HF team, now that HD cath placed will resume warfarin tonight.  PTA warfarin dose was 2.5mg  daily  Goal of Therapy:  INR 2-3  Monitor platelets by anticoagulation protocol: Yes   Plan:  -Warfarin 2.5mg  tonight -Daily INR  Erin Hearing PharmD., BCPS Clinical Pharmacist Pager  [phone] 06/30/2017 7:58 AM

## 2017-06-30 NOTE — Progress Notes (Signed)
Patient ID: Michael [name], male   DOB: 07-Apr-1961, 56 y.o.   MRN: [id number]     Advanced Heart Failure Rounding Note  Primary Cardiologist: Dr. Harrington Challenger HF Cardiology: Luanna Cole  Subjective:    Minimal urine output. Remains fatigued and SOB.   Echo (7/18): EF 50-55%, mild LVH, mild AI, bioprosthetic mitral valve appeared to function normally, RV moderately dilated with moderately decreased systolic function, D-shaped interventricular septum suggestive of RV pressure/volume overload, severe TR, PASP 54 mmHg  RHC Procedural Findings: Hemodynamics (mmHg) RA mean 27 RV 63/8, mean 20 PA 58/28, mean 40 PCWP mean 28 Oxygen saturations: PA 69% AO 96% Cardiac Output (Fick) 9.82  Cardiac Index (Fick) 4.96 PVR 1.2 WU  Objective:   Weight Range: 205 lb 0.4 oz (93 kg) Body mass index is 34.12 kg/m.   Vital Signs:   Temp:  [97.5 F (36.4 C)-97.8 F (36.6 C)] 97.5 F (36.4 C) (08/14 0435) Pulse Rate:  [79-104] 97 (08/14 0435) Resp:  [12-30] 30 (08/14 0435) BP: (95-113)/(58-80) 107/58 (08/14 0435) SpO2:  [96 %-100 %] 97 % (08/14 0435) Weight:  [205 lb 0.4 oz (93 kg)] 205 lb 0.4 oz (93 kg) (08/14 0435) Last BM Date: 06/26/17  Weight change: Filed Weights   06/28/17 0500 06/29/17 0606 06/30/17 0435  Weight: 202 lb 3.2 oz (91.7 kg) 200 lb 13.4 oz (91.1 kg) 205 lb 0.4 oz (93 kg)    Intake/Output:   Intake/Output Summary (Last 24 hours) at 06/30/17 0727 Last data filed at 06/30/17 0630  Gross per 24 hour  Intake             1140 ml  Output              800 ml  Net              340 ml      Physical Exam    General: Ill appearing. No resp difficulty. HEENT: Normal Neck: Supple. JVP to jaw. Carotids 2+ bilat; no bruits. No thyromegaly or nodule noted. Cor: PMI nondisplaced. Irregular rate and rhythm. 3/6 HSM at [place]. Lungs: Clear in upper lobes, fine crackles in bilateral bases.  Abdomen: Soft, non-tender, non-distended, no HSM. No bruits or masses. +BS  Extremities:  No cyanosis, clubbing, rash, 2+ edema to knees bilaterally.  Neuro: Alert & orientedx3, cranial nerves grossly intact. moves all 4 extremities w/o difficulty. Affect pleasant  Telemetry   Atrial fibrillation - personally reviewed.   Labs    CBC  Recent Labs  06/29/17 0242 06/30/17 0321  WBC 5.1 4.9  HGB 7.3* 7.4*  HCT 21.8* 22.2*  MCV 74.1* 73.8*  PLT 153 409   Basic Metabolic Panel  Recent Labs  06/29/17 0242 06/30/17 0321  NA 135 133*  K 3.6 4.2  CL 98* 96*  CO2 27 26  GLUCOSE 151* 120*  BUN 53* 62*  CREATININE 2.73* 3.31*  CALCIUM 8.4* 8.5*   Liver Function Tests No results for input(s): AST, ALT, ALKPHOS, BILITOT, PROT, ALBUMIN in the last 72 hours. No results for input(s): LIPASE, AMYLASE in the last 72 hours. Cardiac Enzymes No results for input(s): CKTOTAL, CKMB, CKMBINDEX, TROPONINI in the last 72 hours.  BNP: BNP (last 3 results)  Recent Labs  10/05/16 0147 05/30/17 1208 06/25/17 1253  BNP 237.4* 1,404.9* 875.7*    ProBNP (last 3 results)  Recent Labs  03/16/17 1401  PROBNP 6,530*        Imaging   Transthoracic Echocardiography 06/01/17  Study Conclusions  -  Left ventricle: The cavity size was normal. There was mild   concentric hypertrophy. Systolic function was normal. The   estimated ejection fraction was in the range of 50% to 55%. Wall   motion was normal; there were no regional wall motion   abnormalities. - Ventricular septum: The contour showed diastolic flattening. - Aortic valve: Transvalvular velocity was minimally increased.   There was no stenosis. There was mild regurgitation. Peak   velocity (S): 201 cm/s. - Mitral valve: A bioprosthesis was present and functioning   normally. Mean gradient (D): 6 mm Hg. Valve area by pressure   half-time: 1.93 cm^2. - Left atrium: The atrium was severely dilated. - Right ventricle: The cavity size was moderately dilated. Wall   thickness was normal. Systolic function was  moderately reduced. - Right atrium: The atrium was moderately dilated. - Tricuspid valve: There was severe regurgitation. - Pulmonary arteries: Systolic pressure was moderately increased.   PA peak pressure: 54 mm Hg (S).   RIGHT HEART CATH 06/29/17 RA mean 27 RV 63/8, mean 20 PA 58/28, mean 40 PCWP mean 28  Oxygen saturations: PA 69% AO 96%  Cardiac Output (Fick) 9.82  Cardiac Index (Fick) 4.96 PVR 1.2 WU     Medications:     Scheduled Medications: . amiodarone  400 mg Oral BID  . atorvastatin  20 mg Oral Daily  . carvedilol  6.25 mg Oral BID WC  . ferrous sulfate  325 mg Oral BID WC  . folic acid  1 mg Oral Daily  . insulin aspart  0-5 Units Subcutaneous QHS  . insulin aspart  0-9 Units Subcutaneous TID WC  . insulin detemir  10 Units Subcutaneous Q2200  . metolazone  5 mg Oral BID  . pantoprazole  40 mg Oral Daily  . predniSONE  40 mg Oral Q breakfast  . sodium chloride  2 spray Each Nare BID  . Warfarin - Pharmacist Dosing Inpatient   Does not apply q1800    Infusions: . furosemide Stopped (06/29/17 2150)  . milrinone 0.25 mcg/kg/min (06/30/17 0552)    PRN Medications: acetaminophen, HYDROmorphone (DILAUDID) injection, oxyCODONE    Patient Profile   56 year old male with past medical history of primarily diastolic CHF with prominent RV failure, prior mitral valve replacement with porcine valve, history of tricuspid valve repair now with severe TR, persistent atrial fibrillation, chronic stage IV kidney disease, diabetes mellitus was admitted with acute on chronic diastolic CHF. Patient underwent mitral valve replacement and tricuspid valve repair in [place] in 2012.    Assessment/Plan   1. Acute on chronic diastolic CHF: EF 00-71% on echo in 7/18 with prominent RV failure (enlarged/hypokinetic RV with D-shaped interventricular septum).  Per Dr. Aundra Dubin - He has severe TR.  It is possible that long-standing mitral valve disease prior to MVR led to  pulmonary vascular changes and pulmonary hypertension with RV failure.  Milrinone was started at admission for RV support.  - Minimal urine output with 160 mg IV lasix TID and metolazone BID.  - Will stop milrinone => CO elevated on RHC yesterday and milrinone has not helped.  - Will likely need CRRT for volume removal. Will ask IR to place tunneled HD cath.   2. AKI on CKD stage IV: Creatinine stabilized on milrinone.  - Creatinine up to 3.31, BUN 62. - will await recommendations regarding CRRT.   3. Atrial fibrillation: Persistent.  Per Dr. Aundra Dubin - Has been in atrial fibrillation for a number of months it appears.  Mild  RVR.  I am not sure how well he will stay in NSR given severe TR and RV failure (even with amiodarone), but given the severity of his HF, I think that it is worth a trial.  - Cover with heparin gtt if INR drops < 2.   - Continue Creg 6.25 mg BID - Continue Amio 400 mg BID.  - Plan for TEE and DCCV when milrinone is off.   4. Anemia: No overt GI bleeding, hgb stably low.  Not Fe or B12 deficient.  Await FOBT.  ?Anemia of chronic disease/renal disease. - Started on ESA.   5. Bioprosthetic mitral valve: Stable appearance on 7/18 echo.  - No change.   6. Probable sleep apnea: Will need sleep study outpatient.  - No change.   Length of Stay: [place], NP  06/30/2017, 7:27 AM  Advanced Heart Failure Team Pager [phone] (M-F; 7a - 4p)  Please contact [facility] for night-coverage after hours (4p -7a ) and weekends on [email or web address]  Patient seen with NP, agree with the above note.   Poor UOP, filling pressures high yesterday on RHC with R>L heart failure.  Creatinine rising.  Discussed with Dr. Marval Regal, think CVVH is our best option here.  It is possible that improved hemodynamics with volume removal will help RV function and thus renal function.   - CVVH to begin.  - Will need to check with Dr. Marval Regal regarding IV Lasix => probably stop.  - Would stop  milrinone as it does not seem to have been of much benefit.   He remains in rate-controlled atrial fibrillation.  As above, would do TEE-guided cardioversion when volume status is better controlled.   [name] 06/30/2017 1:48 PM

## 2017-06-30 NOTE — Progress Notes (Signed)
Patient ID: Michael [name], male   DOB: September 05, 1961, 56 y.o.   MRN: [id number] S:No new complaints but drop in UOP over the last 24 hours O:BP 105/63 (BP Location: Right Arm)   Pulse 87   Temp 97.8 F (36.6 C) (Oral)   Resp 15   Ht 5\' 5"  (1.651 m)   Wt 93 kg (205 lb 0.4 oz)   SpO2 98%   BMI 34.12 kg/m   Intake/Output Summary (Last 24 hours) at 06/30/17 0938 Last data filed at 06/30/17 0852  Gross per 24 hour  Intake             1380 ml  Output              800 ml  Net              580 ml   Intake/Output: I/O last 3 completed shifts: In: 1318.3 [P.O.:1140; I.V.:54.3; IV Piggyback:124] Out: 1475 [Urine:1475]  Intake/Output this shift:  Total I/O In: 240 [P.O.:240] Out: -  Weight change: 1.9 kg (4 lb 3 oz) Gen:NAD sitting upright in chair CVS:IRR IRR, no rub Resp:decreased BS at bases HKV:[id number], + pitting edema of abdominal wall Ext:2+ edema/anasarca   Recent Labs Lab 06/25/17 1252 06/25/17 1300 06/26/17 0422 06/27/17 0337 06/28/17 0302 06/29/17 0242 06/30/17 0321  NA  --  137 138 138 137 135 133*  K  --  3.8 3.7 3.8 3.5 3.6 4.2  CL  --  103 103 103 102 98* 96*  CO2  --  25 26 26 27 27 26   GLUCOSE  --  145* 127* 139* 139* 151* 120*  BUN  --  55* 55* 52* 51* 53* 62*  CREATININE  --  3.02* 2.83* 2.72* 2.68* 2.73* 3.31*  ALBUMIN 3.0*  --   --   --   --   --   --   CALCIUM  --  8.6* 8.5* 8.3* 8.3* 8.4* 8.5*  AST 31  --   --   --   --   --   --   ALT 13*  --   --   --   --   --   --    Liver Function Tests:  Recent Labs Lab 06/25/17 1252  AST 31  ALT 13*  ALKPHOS 113  BILITOT 1.8*  PROT 7.2  ALBUMIN 3.0*   No results for input(s): LIPASE, AMYLASE in the last 168 hours. No results for input(s): AMMONIA in the last 168 hours. CBC:  Recent Labs Lab 06/26/17 0422 06/27/17 0337 06/28/17 0302 06/29/17 0242 06/30/17 0321  WBC 3.6* 3.6* 4.9 5.1 4.9  HGB 7.8* 7.5* 7.3* 7.3* 7.4*  HCT 23.9* 22.4* 22.3* 21.8* 22.2*  MCV 75.4* 75.2* 75.1* 74.1*  73.8*  PLT 181 173 163 153 165   Cardiac Enzymes: No results for input(s): CKTOTAL, CKMB, CKMBINDEX, TROPONINI in the last 168 hours. CBG:  Recent Labs Lab 06/28/17 2105 06/29/17 0748 06/29/17 1715 06/29/17 2120 06/30/17 0756  GLUCAP 265* 117* 167* 201* 142*    Iron Studies:  Recent Labs  06/27/17 1102  IRON 113  TIBC 473*  FERRITIN 31   Studies/Results: No results found. Marland Kitchen amiodarone  400 mg Oral BID  . atorvastatin  20 mg Oral Daily  . carvedilol  6.25 mg Oral BID WC  . ferrous sulfate  325 mg Oral BID WC  . folic acid  1 mg Oral Daily  . insulin aspart  0-5 Units Subcutaneous QHS  . insulin aspart  0-9 Units Subcutaneous TID WC  . insulin detemir  10 Units Subcutaneous Q2200  . metolazone  5 mg Oral BID  . pantoprazole  40 mg Oral Daily  . predniSONE  40 mg Oral Q breakfast  . sodium chloride  2 spray Each Nare BID    BMET    Component Value Date/Time   NA 133 (L) 06/30/2017 0321   NA 140 03/30/2017 1406   K 4.2 06/30/2017 0321   CL 96 (L) 06/30/2017 0321   CO2 26 06/30/2017 0321   GLUCOSE 120 (H) 06/30/2017 0321   BUN 62 (H) 06/30/2017 0321   BUN 33 (H) 03/30/2017 1406   CREATININE 3.31 (H) 06/30/2017 0321   CREATININE 1.61 (H) 01/16/2017 1653   CALCIUM 8.5 (L) 06/30/2017 0321   GFRNONAA 19 (L) 06/30/2017 0321   GFRNONAA 47 (L) 01/16/2017 1653   GFRAA 22 (L) 06/30/2017 0321   GFRAA 54 (L) 01/16/2017 1653   CBC    Component Value Date/Time   WBC 4.9 06/30/2017 0321   RBC 3.01 (L) 06/30/2017 0321   HGB 7.4 (L) 06/30/2017 0321   HCT 22.2 (L) 06/30/2017 0321   PLT 165 06/30/2017 0321   MCV 73.8 (L) 06/30/2017 0321   MCH 24.6 (L) 06/30/2017 0321   MCHC 33.3 06/30/2017 0321   RDW 19.0 (H) 06/30/2017 0321   LYMPHSABS 1.1 02/21/2017 1846   MONOABS 0.7 02/21/2017 1846   EOSABS 0.3 02/21/2017 1846   BASOSABS 0.1 02/21/2017 1846     Assessment/Plan:  1. AKI/CKD stage 3-4 (baseline Scr 1.8-2.4) with multiple episodes of AKI related to  cardiorenal syndrome/decompensated RV failure.  Now not responding to high dose IV lasix and metolazone with bump in BUN/Cr. 1. Discussed with CHF team and will attempt CVVHD with UF and see how he does, hopefully as we offload his RV, his BP and renal function will improve. 2. 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  Heart failure team has pt on milrinone.  He has been diuresing with lasix 120mg  tid and metolazone until the last 24 hours and has had a marked drop off on UOP with ^ in BUN/Cr.  CVVHD as above and continue to follow I's/O's, daily weights, and Scr. 3. Atrial fib- persistent 4. Anemia- microcytic.  Iron stores acceptable, will need to initiate ESA. 5. Bioprosthetic MV- stable    [name], MD [facility] [phone]

## 2017-06-30 NOTE — Care Management Note (Signed)
Case Management Note Previous note created by [facility]  Patient Details  Name: Michael [name] MRN: [id number] Date of Birth: 11/03/1961  Subjective/Objective:     CM following for progression and d/c planning.                Action/Plan: 06/26/2017 Met with pt re OBS status. Pt will be followed by [facility] and is a pt at [facility]). He has an appointment currently scheduled for Friday, July 03, 2017. Pt is also active with [facility] for [facility] and [place] services. This CM contacted [facility] re pt admission. [facility] visited pt today and followed up with [name], nurse case manager for [facility].    Expected Discharge Date:                  Expected Discharge Plan:  [place]  In-House Referral:  NA  Discharge planning Services  CM Consult, Follow-up appt scheduled  Post Acute Care Choice:  Home Health Choice offered to:  Patient  DME Arranged:    DME Agency:     HH Arranged:  RN, PT [place] Agency:  [facility] (now [facility])  Status of Service:  In process, will continue to follow  If discussed at Long Length of Stay Meetings, dates discussed:    Additional Comments: 06/30/2017 Discussed in LOS 06/30/17 - pt remains appropriate for continued stay.  Pt is now on milrinone and lasix drip - will likely need CRRT.  Once discharge is near - CM will need to set up new appt for [facility] Maryclare Labrador, RN 06/30/2017, 11:34 AM

## 2017-07-01 ENCOUNTER — Ambulatory Visit: Payer: Self-pay

## 2017-07-01 DIAGNOSIS — I5023 Acute on chronic systolic (congestive) heart failure: Secondary | ICD-10-CM

## 2017-07-01 LAB — CBC
HEMATOCRIT: 23.8 % — AB (ref 39.0–52.0)
HEMOGLOBIN: 7.9 g/dL — AB (ref 13.0–17.0)
MCH: 24.9 pg — ABNORMAL LOW (ref 26.0–34.0)
MCHC: 33.2 g/dL (ref 30.0–36.0)
MCV: 75.1 fL — ABNORMAL LOW (ref 78.0–100.0)
Platelets: 193 10*3/uL (ref 150–400)
RBC: 3.17 MIL/uL — AB (ref 4.22–5.81)
RDW: 19.3 % — ABNORMAL HIGH (ref 11.5–15.5)
WBC: 4.8 10*3/uL (ref 4.0–10.5)

## 2017-07-01 LAB — BASIC METABOLIC PANEL
ANION GAP: 9 (ref 5–15)
BUN: 56 mg/dL — AB (ref 6–20)
CALCIUM: 8.6 mg/dL — AB (ref 8.9–10.3)
CO2: 27 mmol/L (ref 22–32)
Chloride: 98 mmol/L — ABNORMAL LOW (ref 101–111)
Creatinine, Ser: 2.68 mg/dL — ABNORMAL HIGH (ref 0.61–1.24)
GFR calc Af Amer: 29 mL/min — ABNORMAL LOW (ref >60)
GFR, EST NON AFRICAN AMERICAN: 25 mL/min — AB (ref >60)
GLUCOSE: 139 mg/dL — AB (ref 65–99)
Potassium: 4.2 mmol/L (ref 3.5–5.1)
SODIUM: 134 mmol/L — AB (ref 135–145)

## 2017-07-01 LAB — RENAL FUNCTION PANEL
ALBUMIN: 3.1 g/dL — AB (ref 3.5–5.0)
Anion gap: 10 (ref 5–15)
BUN: 56 mg/dL — ABNORMAL HIGH (ref 6–20)
CO2: 27 mmol/L (ref 22–32)
Calcium: 8.6 mg/dL — ABNORMAL LOW (ref 8.9–10.3)
Chloride: 97 mmol/L — ABNORMAL LOW (ref 101–111)
Creatinine, Ser: 2.66 mg/dL — ABNORMAL HIGH (ref 0.61–1.24)
GFR, EST AFRICAN AMERICAN: 29 mL/min — AB (ref >60)
GFR, EST NON AFRICAN AMERICAN: 25 mL/min — AB (ref >60)
Glucose, Bld: 139 mg/dL — ABNORMAL HIGH (ref 65–99)
PHOSPHORUS: 4 mg/dL (ref 2.5–4.6)
Potassium: 4.3 mmol/L (ref 3.5–5.1)
Sodium: 134 mmol/L — ABNORMAL LOW (ref 135–145)

## 2017-07-01 LAB — BPAM FFP
BLOOD PRODUCT EXPIRATION DATE: 201808182359
ISSUE DATE / TIME: 201808141009
Unit Type and Rh: 7300

## 2017-07-01 LAB — GLUCOSE, CAPILLARY
GLUCOSE-CAPILLARY: 135 mg/dL — AB (ref 65–99)
GLUCOSE-CAPILLARY: 190 mg/dL — AB (ref 65–99)
GLUCOSE-CAPILLARY: 217 mg/dL — AB (ref 65–99)
GLUCOSE-CAPILLARY: 155 mg/dL — AB (ref 65–99)

## 2017-07-01 LAB — PREPARE FRESH FROZEN PLASMA: Unit division: 0

## 2017-07-01 LAB — PROTIME-INR
INR: 2.27
Prothrombin Time: 25.5 seconds — ABNORMAL HIGH (ref 11.4–15.2)

## 2017-07-01 LAB — MAGNESIUM: MAGNESIUM: 2.6 mg/dL — AB (ref 1.7–2.4)

## 2017-07-01 MED ORDER — CARVEDILOL 3.125 MG PO TABS
3.1250 mg | ORAL_TABLET | Freq: Two times a day (BID) | ORAL | Status: DC
Start: 2017-07-01 — End: 2017-07-09
  Administered 2017-07-01 – 2017-07-08 (×16): 3.125 mg via ORAL
  Filled 2017-07-01 (×16): qty 1

## 2017-07-01 MED ORDER — WARFARIN SODIUM 2.5 MG PO TABS
2.5000 mg | ORAL_TABLET | Freq: Once | ORAL | Status: AC
  Administered 2017-07-01: 2.5 mg via ORAL
  Filled 2017-07-01: qty 1

## 2017-07-01 NOTE — Hospital Discharge Follow-Up (Signed)
The patient is known to the [facility].  Will schedule his hospital follow up appointment when discharge date is determined.

## 2017-07-01 NOTE — Progress Notes (Addendum)
[facility]                                                                              Patient Demographics  Michael [name], is a 56 y.o. male, DOB - May 05, 1961, WPY:[id number]  Admit date - 06/25/2017   Admitting Physician Michael Aloe, MD  Outpatient Primary MD for the patient is Michael Morale, MD  Outpatient specialists:   LOS - 4  days   Medical records reviewed and are as summarized below:    Chief Complaint  Patient presents with  . Shortness of Breath  . Congestive Heart Failure       Brief summary   56 year old male with a PMH of type II DM, HTN, HLD, COPD, CAD, pulmonary hypertension, OSA, DM, mitral valve replacement with porcine valve, tricuspid valve repair, paroxysmal A. fib on Coumadin anticoagulation, chronic systolic CHF but now with improved EF, stage IV chronic kidney disease, cirrhosis, gout, recent hospitalization 05/30/17-06/02/17 for decompensated CHF and anasarca, seen by PCP on day of admission for worsening dyspnea, leg edema despite reported compliance with Lasix 80 mg twice a day but ongoing salt intake and gained 20 pounds in the last 3 weeks, referred to ED for admission.Anasarca, multifactorial related to pulmonary hypertension, right heart failure, cirrhosis, chronic kidney disease and hypoalbuminemia. Cardiology consulted, milrinone drip initiated, transferred to stepdown 8/10. Nephrology consulted 8/11.Status post right heart cath 8/13. Very slow to progress. Need several days inpatient care. Started CVVHD 8/14   Assessment & Plan    Principal Problem:   Acute on chronic combined systolic and diastolic CHF (congestive heart failure) ([place]) - Predominantly right heart failure, anasarca - 2-D echo 5/23 showed EF of 30-40% but repeat echo on 7/16 showed EF of 50-55% with PA pressure of 55. - Patient had presented with decompensated CHF, anasarca, weight gain of 20 pounds in the last 3 weeks, cardiology was consulted. Patient  underwent right heart cath on 8/13 which showed predominantly right heart failure. - Lasix was increased to 160 mg IV q 8 hours with milrinone drip and metolazone. - Patient remained markedly volume overload. Hence per nephrology and cardiology, CVVHD was started on 8/14. Day #2 today, CVP remained high up to 30 today however patient feels swelling is decreasing  Active Problems:   Acute kidney injury on CKD (chronic kidney disease), stage IV (HCC) - Creatinine on recent discharge was 2.17, presented with creatinine of 3.02 - Likely related to decreased renal perfusion from cardiorenal syndrome/CHF. - Renal ultrasound showed no obstruction/hydronephrosis. - Nephrology was consulted, patient was placed on IV Lasix and Zaroxolyn for diuresis however creatinine continued to trend up. - Patient started on CVVHD on 8/14 - Creatinine improving, 2.6 today    DM type 2 (diabetes mellitus, type 2) (HCC) - Hemoglobin A1c 5.5 - CBGs improving, was somewhat elevated due to prednisone for gout - Continue sliding scale insulin    Essential hypertension - BP currently stable    Acute Gout - Per patient, pain in the bilateral feet, great toes, right elbow, left fourth toe improving today - No NSAIDs, colchicine due to renal issues. Continue prednisone, pain control  Chronic atrial fibrillation [facility]) - Cardiology following - In Atrial fibrillation for greater than one year, continue Coreg, amiodarone - INR 2.2, Coumadin restarted, was held for cardiac catheterization  Cirrhosis - Fluid management as above, outpatient follow-up with GI  GERD - Continue PPI  Microcytic anemia/iron deficiency - Aranesp per nephrology  Status post porcine MVR and tricuspid valve repair  Code Status:  Full  DVT Prophylaxis:  Coumadin  Family Communication: Discussed in detail with the patient, all imaging results, lab results explained to the patient   Disposition Plan:  SDU   Time Spent in minutes  25   minutes  Procedures:  CVVHD started 8/14      RIGHT HEART CATH  Conclusion   1. Elevated right and left heart filling pressures, more prominent RV failure.  2. Pulmonary venous hypertension.  3. Cardiac output vigorous on milrinone.      Consultants:   Cardiology Nephrology  Antimicrobials:      Medications  Scheduled Meds: . amiodarone  400 mg Oral BID  . atorvastatin  20 mg Oral Daily  . carvedilol  3.125 mg Oral BID WC  . Chlorhexidine Gluconate Cloth  6 each Topical Daily  . ferrous sulfate  325 mg Oral BID WC  . folic acid  1 mg Oral Daily  . insulin aspart  0-5 Units Subcutaneous QHS  . insulin aspart  0-9 Units Subcutaneous TID WC  . insulin detemir  10 Units Subcutaneous Q2200  . pantoprazole  40 mg Oral Daily  . predniSONE  40 mg Oral Q breakfast  . sodium chloride  2 spray Each Nare BID  . sodium chloride flush  10-40 mL Intracatheter Q12H  . warfarin  2.5 mg Oral ONCE-1800  . Warfarin - Pharmacist Dosing Inpatient   Does not apply q1800   Continuous Infusions: . sodium chloride 10 mL/hr at 07/01/17 1100  . heparin    . dialysis replacement fluid (prismasate) 300 mL/hr at 07/01/17 0934  . dialysis replacement fluid (prismasate) 300 mL/hr at 07/01/17 0907  . dialysate (PRISMASATE) 1,000 mL/hr at 07/01/17 0725   PRN Meds:.acetaminophen, heparin, heparin, HYDROmorphone (DILAUDID) injection, oxyCODONE, sodium chloride flush   Antibiotics   Anti-infectives    None        Subjective:   Michael [name] was seen and examined today. Patient on CVVHD, tolerating well. Peripheral edema improving per patient, making urine. Shortness of breath on minimal exertion. Patient denies dizziness, chest pain, abdominal pain, N/V/D/C, new weakness, numbess, tingling. No acute events overnight.    Objective:   Vitals:   07/01/17 0800 07/01/17 0900 07/01/17 1000 07/01/17 1100  BP: 106/86 117/62 120/83 109/66  Pulse: 79 79 69 76  Resp: 14 12 11 13   Temp:       TempSrc:      SpO2: 99% 99% 99% 100%  Weight:      Height:        Intake/Output Summary (Last 24 hours) at 07/01/17 1150 Last data filed at 07/01/17 1100  Gross per 24 hour  Intake          1586.81 ml  Output             4004 ml  Net         -2417.19 ml     Wt Readings from Last 3 Encounters:  07/01/17 92.5 kg (203 lb 14.8 oz)  06/25/17 91.5 kg (201 lb 12.8 oz)  06/02/17 83.9 kg (185 lb)     Exam  General: Alert  and oriented x 3, NAD, ill appearing   Eyes: PERRLA, EOMI, Anicteric Sclera,  HEENT:  Atraumatic, normocephalic  Cardiovascular: S1 S2 auscultated, irregularly irregular 3/6 systolic murmur   Respiratory: diminished BS in the bases with few bibasilar crackles   Gastrointestinal: Soft, nontender, nondistended, + bowel sounds  Ext: 2+ pedal edema bilaterally  Neuro: AAOx3, Cr N's II- XII. Strength 5/5 upper and lower extremities bilaterally, speech clear, sensations grossly intact  Musculoskeletal: No digital cyanosis, clubbing  Skin: No rashes  Psych: Normal affect and demeanor, alert and oriented x3    Data Reviewed:  I have personally reviewed following labs and imaging studies  Micro Results Recent Results (from the past 240 hour(s))  MRSA PCR Screening     Status: None   Collection Time: 06/26/17 10:03 PM  Result Value Ref Range Status   MRSA by PCR NEGATIVE NEGATIVE Final    Comment:        The GeneXpert MRSA Assay (FDA approved for NASAL specimens only), is one component of a comprehensive MRSA colonization surveillance program. It is not intended to diagnose MRSA infection nor to guide or monitor treatment for MRSA infections.   Culture, blood (Routine X 2) w Reflex to ID Panel     Status: None (Preliminary result)   Collection Time: 06/28/17  9:36 AM  Result Value Ref Range Status   Specimen Description BLOOD RIGHT WRIST  Final   Special Requests   Final    BOTTLES DRAWN AEROBIC AND ANAEROBIC Blood Culture adequate volume    Culture NO GROWTH 2 DAYS  Final   Report Status PENDING  Incomplete  Culture, blood (Routine X 2) w Reflex to ID Panel     Status: None (Preliminary result)   Collection Time: 06/28/17  9:40 AM  Result Value Ref Range Status   Specimen Description BLOOD LEFT WRIST  Final   Special Requests   Final    BOTTLES DRAWN AEROBIC AND ANAEROBIC Blood Culture adequate volume   Culture NO GROWTH 2 DAYS  Final   Report Status PENDING  Incomplete    Radiology Reports Dg Chest 2 View  Result Date: 06/25/2017 CLINICAL DATA:  Acute shortness of breath for 2 days. EXAM: CHEST  2 VIEW COMPARISON:  05/31/2017 and prior radiographs FINDINGS: Cardiomegaly and median sternotomy again noted. Mild pulmonary vascular congestion and bibasilar opacities are again noted. Trace bilateral pleural effusions are present. There is no evidence of pneumothorax or acute bony abnormality. IMPRESSION: Cardiomegaly with mild pulmonary vascular congestion. Unchanged bibasilar opacities which may represent atelectasis/scarring/airspace disease. Trace bilateral pleural effusions. Electronically Signed   By: Margarette Canada M.D.   On: 06/25/2017 13:37   US Renal  Result Date: 06/25/2017 CLINICAL DATA:  Subacute onset of renal insufficiency. Initial encounter. EXAM: RENAL / URINARY TRACT ULTRASOUND COMPLETE COMPARISON:  CT of the abdomen and pelvis performed 05/30/2017 FINDINGS: Right Kidney: Length: 10.3 cm. Increased parenchymal echogenicity is noted. No mass or hydronephrosis visualized. Left Kidney: Length: 9.9 cm. Increased parenchymal echogenicity is noted. A large 1.8 cm stone is noted at the interpole region of the left kidney. No mass or hydronephrosis visualized. Bladder: Appears normal for degree of bladder distention. Small to moderate volume ascites is noted within the abdomen. Small bilateral pleural effusions are seen. IMPRESSION: 1. No evidence of hydronephrosis. 2. Increased renal parenchymal echogenicity raises concern for  medical renal disease. 3. Large 1.8 cm nonobstructing stone again noted at the left kidney. 4. Small to moderate volume ascites within the abdomen. 5.  Small bilateral pleural effusions seen. Electronically Signed   By: Garald Balding M.D.   On: 06/25/2017 23:05   Dg Chest Port 1 View  Result Date: 06/30/2017 CLINICAL DATA:  56 year old male status post placement of a non tunneled hemodialysis catheter EXAM: PORTABLE CHEST 1 VIEW COMPARISON:  Prior chest x-ray 06/25/2017 FINDINGS: Right IJ approach non tunneled hemodialysis catheter with the tip overlying the distal SVC. Marked cardiomegaly is similar compared to prior. Patient is status post median sternotomy with evidence of mitral valve replacement. Trace atherosclerotic calcifications in the transverse aorta. No evidence of pneumothorax. Persistent pulmonary vascular congestion. Small right-sided pleural effusion, perhaps slightly increased compared to yesterday. IMPRESSION: 1. New right IJ approach non tunneled hemodialysis catheter. The catheter tip overlies the distal SVC. 2. Similar to slightly enlarged layering right pleural effusion. 3. Stable cardiomegaly. 4.  Aortic Atherosclerosis (ICD10-170.0) Electronically Signed   By: Jacqulynn Cadet M.D.   On: 06/30/2017 14:15    Lab Data:  CBC:  Recent Labs Lab 06/27/17 0337 06/28/17 0302 06/29/17 0242 06/30/17 0321 07/01/17 0436  WBC 3.6* 4.9 5.1 4.9 4.8  HGB 7.5* 7.3* 7.3* 7.4* 7.9*  HCT 22.4* 22.3* 21.8* 22.2* 23.8*  MCV 75.2* 75.1* 74.1* 73.8* 75.1*  PLT 173 163 153 165 419   Basic Metabolic Panel:  Recent Labs Lab 06/27/17 0337 06/28/17 0302 06/29/17 0242 06/30/17 0321 06/30/17 1454 07/01/17 0436  NA 138 137 135 133* 132* 134*  134*  K 3.8 3.5 3.6 4.2 4.3 4.3  4.2  CL 103 102 98* 96* 95* 97*  98*  CO2 26 27 27 26 25 27  27   GLUCOSE 139* 139* 151* 120* 148* 139*  139*  BUN 52* 51* 53* 62* 68* 56*  56*  CREATININE 2.72* 2.68* 2.73* 3.31* 3.39* 2.66*  2.68*    CALCIUM 8.3* 8.3* 8.4* 8.5* 8.4* 8.6*  8.6*  MG 2.9*  --   --   --   --  2.6*  PHOS  --   --   --   --  4.7* 4.0   GFR: Estimated Creatinine Clearance: 32 mL/min (A) (by C-G formula based on SCr of 2.66 mg/dL (H)). Liver Function Tests:  Recent Labs Lab 06/25/17 1252 06/30/17 1454 07/01/17 0436  AST 31  --   --   ALT 13*  --   --   ALKPHOS 113  --   --   BILITOT 1.8*  --   --   PROT 7.2  --   --   ALBUMIN 3.0* 2.9* 3.1*   No results for input(s): LIPASE, AMYLASE in the last 168 hours. No results for input(s): AMMONIA in the last 168 hours. Coagulation Profile:  Recent Labs Lab 06/27/17 0337 06/28/17 0302 06/29/17 0242 06/30/17 0321 07/01/17 0436  INR 2.74 2.73 2.60 2.42 2.27   Cardiac Enzymes: No results for input(s): CKTOTAL, CKMB, CKMBINDEX, TROPONINI in the last 168 hours. BNP (last 3 results)  Recent Labs  03/16/17 1401  PROBNP 6,530*   HbA1C: No results for input(s): HGBA1C in the last 72 hours. CBG:  Recent Labs Lab 06/30/17 1138 06/30/17 1625 06/30/17 2122 07/01/17 0722 07/01/17 1130  GLUCAP 151* 171* 167* 135* 190*   Lipid Profile: No results for input(s): CHOL, HDL, LDLCALC, TRIG, CHOLHDL, LDLDIRECT in the last 72 hours. Thyroid Function Tests: No results for input(s): TSH, T4TOTAL, FREET4, T3FREE, THYROIDAB in the last 72 hours. Anemia Panel: No results for input(s): VITAMINB12, FOLATE, FERRITIN, TIBC, IRON, RETICCTPCT in the last 72 hours. Urine  analysis:    Component Value Date/Time   COLORURINE YELLOW 05/30/2017 0538   APPEARANCEUR CLEAR 05/30/2017 0538   LABSPEC 1.013 05/30/2017 0538   PHURINE 5.0 05/30/2017 0538   GLUCOSEU NEGATIVE 05/30/2017 0538   HGBUR SMALL (A) 05/30/2017 0538   BILIRUBINUR NEGATIVE 05/30/2017 [place] 05/30/2017 0538   PROTEINUR 100 (A) 05/30/2017 0538   NITRITE NEGATIVE 05/30/2017 0538   LEUKOCYTESUR TRACE (A) 05/30/2017 0538     Ripudeep Rai M.D. [facility] 07/01/2017,  11:50 AM  Pager: [phone] Between 7am to 7pm - call Pager - [phone]  After 7pm go to [email or web address] - password [email or web address]  Call night coverage person covering after 7pm

## 2017-07-01 NOTE — Progress Notes (Signed)
ANTICOAGULATION CONSULT NOTE   Pharmacy Consult for warfarin Indication: atrial fibrillation  No Known Allergies  Labs:  Recent Labs  06/29/17 0242 06/30/17 0321 06/30/17 1454 07/01/17 0436  HGB 7.3* 7.4*  --  7.9*  HCT 21.8* 22.2*  --  23.8*  PLT 153 165  --  193  LABPROT 28.4* 26.8*  --  25.5*  INR 2.60 2.42  --  2.27  CREATININE 2.73* 3.31* 3.39* 2.66*  2.68*    Estimated Creatinine Clearance: 32 mL/min (A) (by C-G formula based on SCr of 2.66 mg/dL (H)). Pt on warfarin PTA for afib/MVR (bioprosthetic).   INR this am is 2.2, now s/p RHC indicating RV failure off milrinone. CRRT start 8/14. Plan is to continue warfarin for now. Hgb low but stable.  PTA warfarin dose was 2.5mg  daily  Goal of Therapy:  INR 2-3  Monitor platelets by anticoagulation protocol: Yes   Plan:  -Warfarin 2.5mg  tonight -Daily INR  Erin Hearing PharmD., BCPS Clinical Pharmacist Pager [phone] 07/01/2017 8:54 AM

## 2017-07-01 NOTE — Care Management Note (Addendum)
Case Management Note  Patient Details  Name: Michael [name] MRN: [id number] Date of Birth: Dec 31, 1960  Subjective/Objective:  From home alone, hea has a daughter who works and goes to school every day, Patient is established with [facility]al Sidles will reschedule patient's follow up appt when he is near discharge. He is also active with [facility] for [facility], and HHPT, also will be followed by [facility].  He has Acute /Chronic CHF, AKI, CKD IV, afib,  Anemia, bioprosthetic MV.  CVP remains elevated , HD cath place 8/14 and CRRT started for volume removal.  Plan for TEE and DCCV when volume is better per MD  Note.      8/16 [place], BSN - NCM spoke with patient and informed him that he will be with [facility] for [facility] , he states that is fine ,Bethena Roys is aware. He will need daily weights and if needed iv lasix at home, Per MD they do give iv lasix thru [facility] agency at home.  He conts to receive CRRT today.          Action/Plan: NCM will follow for dc needs.   Expected Discharge Date:                  Expected Discharge Plan:  [place]  In-House Referral:  NA  Discharge planning Services  CM Consult, Follow-up appt scheduled  Post Acute Care Choice:  Home Health Choice offered to:  Patient  DME Arranged:    DME Agency:     HH Arranged:  RN, PT [place] Agency:  [facility] (now [facility])  Status of Service:  In process, will continue to follow  If discussed at Long Length of Stay Meetings, dates discussed:    Additional Comments:  [name], RN 07/01/2017, 10:30 AM

## 2017-07-01 NOTE — Progress Notes (Signed)
Patient ID: Michael [name], male   DOB: [birth year], 56 y.o.   MRN: [id number]     Advanced Heart Failure Rounding Note  Primary Cardiologist: Dr. Harrington Challenger HF Cardiology: Luanna Cole  Subjective:    Started CVVHD yesterday and stopped milrinone. Pulling 100 ml an hour. BP stable.  Feels a little better this morning, less SOB. Denies palpitations and chest pain. CVP 26.   Echo (7/18): EF 50-55%, mild LVH, mild AI, bioprosthetic mitral valve appeared to function normally, RV moderately dilated with moderately decreased systolic function, D-shaped interventricular septum suggestive of RV pressure/volume overload, severe TR, PASP 54 mmHg  RHC Procedural Findings: Hemodynamics (mmHg) RA mean 27 RV 63/8, mean 20 PA 58/28, mean 40 PCWP mean 28 Oxygen saturations: PA 69% AO 96% Cardiac Output (Fick) 9.82  Cardiac Index (Fick) 4.96 PVR 1.2 WU  Objective:   Weight Range: 203 lb 14.8 oz (92.5 kg) Body mass index is 33.93 kg/m.   Vital Signs:   Temp:  [97.4 F (36.3 C)-98 F (36.7 C)] 97.8 F (36.6 C) (08/15 0722) Pulse Rate:  [58-100] 79 (08/15 0722) Resp:  [10-19] 14 (08/15 0722) BP: (100-130)/(63-99) 119/78 (08/15 0722) SpO2:  [97 %-100 %] 99 % (08/15 0722) Weight:  [203 lb 14.8 oz (92.5 kg)-207 lb 14.3 oz (94.3 kg)] 203 lb 14.8 oz (92.5 kg) (08/15 0400) Last BM Date: 06/29/17  Weight change: Filed Weights   06/30/17 0435 06/30/17 1341 07/01/17 0400  Weight: 205 lb 0.4 oz (93 kg) 207 lb 14.3 oz (94.3 kg) 203 lb 14.8 oz (92.5 kg)    Intake/Output:   Intake/Output Summary (Last 24 hours) at 07/01/17 0747 Last data filed at 07/01/17 0700  Gross per 24 hour  Intake          1702.81 ml  Output             2954 ml  Net         -1251.19 ml      Physical Exam    General: Well appearing. No resp difficulty. HEENT: Normal Neck: Supple. JVP remains elevated to jaw at least . Carotids 2+ bilat; no bruits. No thyromegaly or nodule noted. Cor: PMI nondisplaced. Irregularly  irregular. 3/6 HSM at [place].  Lungs: CTAB, normal effort. Abdomen: Soft, non-tender, non-distended, no HSM. No bruits or masses. +BS  Extremities: No cyanosis, clubbing, rash. 2+ pretibial edema.  Neuro: Alert & orientedx3, cranial nerves grossly intact. moves all 4 extremities w/o difficulty. Affect pleasant   Telemetry   Atrial fibrillation - personally reviewed.   Labs    CBC  Recent Labs  06/30/17 0321 07/01/17 0436  WBC 4.9 4.8  HGB 7.4* 7.9*  HCT 22.2* 23.8*  MCV 73.8* 75.1*  PLT 165 466   Basic Metabolic Panel  Recent Labs  06/30/17 1454 07/01/17 0436  NA 132* 134*  134*  K 4.3 4.3  4.2  CL 95* 97*  98*  CO2 25 27  27   GLUCOSE 148* 139*  139*  BUN 68* 56*  56*  CREATININE 3.39* 2.66*  2.68*  CALCIUM 8.4* 8.6*  8.6*  MG  --  2.6*  PHOS 4.7* 4.0   Liver Function Tests  Recent Labs  06/30/17 1454 07/01/17 0436  ALBUMIN 2.9* 3.1*   No results for input(s): LIPASE, AMYLASE in the last 72 hours. Cardiac Enzymes No results for input(s): CKTOTAL, CKMB, CKMBINDEX, TROPONINI in the last 72 hours.  BNP: BNP (last 3 results)  Recent Labs  10/05/16 0147 05/30/17 1208 06/25/17 1253  BNP 237.4* 1,404.9* 875.7*    ProBNP (last 3 results)  Recent Labs  03/16/17 1401  PROBNP 6,530*        Imaging   Transthoracic Echocardiography 06/01/17  Study Conclusions  - Left ventricle: The cavity size was normal. There was mild   concentric hypertrophy. Systolic function was normal. The   estimated ejection fraction was in the range of 50% to 55%. Wall   motion was normal; there were no regional wall motion   abnormalities. - Ventricular septum: The contour showed diastolic flattening. - Aortic valve: Transvalvular velocity was minimally increased.   There was no stenosis. There was mild regurgitation. Peak   velocity (S): 201 cm/s. - Mitral valve: A bioprosthesis was present and functioning   normally. Mean gradient (D): 6 mm Hg. Valve  area by pressure   half-time: 1.93 cm^2. - Left atrium: The atrium was severely dilated. - Right ventricle: The cavity size was moderately dilated. Wall   thickness was normal. Systolic function was moderately reduced. - Right atrium: The atrium was moderately dilated. - Tricuspid valve: There was severe regurgitation. - Pulmonary arteries: Systolic pressure was moderately increased.   PA peak pressure: 54 mm Hg (S).   RIGHT HEART CATH 06/29/17 RA mean 27 RV 63/8, mean 20 PA 58/28, mean 40 PCWP mean 28  Oxygen saturations: PA 69% AO 96%  Cardiac Output (Fick) 9.82  Cardiac Index (Fick) 4.96 PVR 1.2 WU     Medications:     Scheduled Medications: . amiodarone  400 mg Oral BID  . atorvastatin  20 mg Oral Daily  . carvedilol  6.25 mg Oral BID WC  . Chlorhexidine Gluconate Cloth  6 each Topical Daily  . ferrous sulfate  325 mg Oral BID WC  . folic acid  1 mg Oral Daily  . insulin aspart  0-5 Units Subcutaneous QHS  . insulin aspart  0-9 Units Subcutaneous TID WC  . insulin detemir  10 Units Subcutaneous Q2200  . metolazone  5 mg Oral BID  . pantoprazole  40 mg Oral Daily  . predniSONE  40 mg Oral Q breakfast  . sodium chloride  2 spray Each Nare BID  . sodium chloride flush  10-40 mL Intracatheter Q12H  . Warfarin - Pharmacist Dosing Inpatient   Does not apply q1800    Infusions: . sodium chloride 10 mL/hr at 07/01/17 0500  . heparin    . dialysis replacement fluid (prismasate) 300 mL/hr at 06/30/17 1503  . dialysis replacement fluid (prismasate) 300 mL/hr at 06/30/17 1501  . 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 Patient underwent mitral valve replacement and tricuspid valve repair in [place] in 2012.    Assessment/Plan   1. Acute on chronic diastolic CHF: EF 97-98% on echo in 7/18 with prominent RV failure (enlarged/hypokinetic RV with D-shaped interventricular septum).  Per Dr. Aundra Dubin - He has severe TR.  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  Milrinone was started at admission for RV support, stopped now that  he is getting CVVH.  - Started CRRT for volume removal. CVP remains elevated at 26. - Reduce Coreg 3.125 mg BID.  - Stop metolazone.  - RN reports pt. Can likely tolerate more than -100 ml an hour. Renal will see today and adjust accordingly.   2. AKI on CKD stage IV: Now on CVVH and off milrinone.   -  Creatinine 2.66, BUN 56.  -  500 ml urine out overnight.   3. Atrial fibrillation: Persistent.  Per Dr. Aundra Dubin - Has been in atrial fibrillation for a number of months it appears.  Mild RVR.  I am not sure how well he will stay in NSR given severe TR and RV failure (even with amiodarone), but given the severity of his HF, I think that it is worth a trial.  - Cover with heparin gtt if INR drops < 2, continue warfarin.   - Continue Amio 400 mg BID.  - Plan for TEE and DCCV when volume status is better.     4. Anemia: No overt GI bleeding, hgb stably low.  Not Fe or B12 deficient.  Await FOBT.  ?Anemia of chronic disease/renal disease. - Started on ESA.  - Hgb 7.9 today.   5. Bioprosthetic mitral valve: Stable appearance on 7/18 echo.  - No change  6. Probable sleep apnea: Will need sleep study outpatient.  - No change.   Length of Stay: [place], NP  07/01/2017, 7:47 AM  Advanced Heart Failure Team Pager [phone] (M-F; 7a - 4p)    Please contact [facility] for night-coverage after hours (4p -7a ) and weekends on [email or web address]  Patient seen with NP, agree with the above note.  CVP remains very high.  UF 100 cc/hr via CVVH and now off milrinone.  BP stable and making urine in addition to CVVH.  Probably could pull a little faster, can try 150 cc/hr, but need to make sure we do not drop his BP/damage renal function further.  Agree with decrease in Coreg to 3.125 mg bid.   After volume status is optimized, will plan TEE-guided DCCV.  He will remain on amiodarone and warfarin.   [name] 07/01/2017 8:47 AM

## 2017-07-02 ENCOUNTER — Encounter (HOSPITAL_COMMUNITY): Payer: Self-pay | Admitting: *Deleted

## 2017-07-02 LAB — PROTIME-INR
INR: 3.32
Prothrombin Time: 34.4 seconds — ABNORMAL HIGH (ref 11.4–15.2)

## 2017-07-02 LAB — CBC
HEMATOCRIT: 24.4 % — AB (ref 39.0–52.0)
HEMATOCRIT: 24.9 % — AB (ref 39.0–52.0)
HEMOGLOBIN: 7.7 g/dL — AB (ref 13.0–17.0)
Hemoglobin: 8.1 g/dL — ABNORMAL LOW (ref 13.0–17.0)
MCH: 24.4 pg — AB (ref 26.0–34.0)
MCH: 24.8 pg — ABNORMAL LOW (ref 26.0–34.0)
MCHC: 31.6 g/dL (ref 30.0–36.0)
MCHC: 32.5 g/dL (ref 30.0–36.0)
MCV: 77.5 fL — AB (ref 78.0–100.0)
MCV: 76.1 fL — AB (ref 78.0–100.0)
PLATELETS: 205 10*3/uL (ref 150–400)
Platelets: 189 10*3/uL (ref 150–400)
RBC: 3.15 MIL/uL — ABNORMAL LOW (ref 4.22–5.81)
RBC: 3.27 MIL/uL — ABNORMAL LOW (ref 4.22–5.81)
RDW: 21.4 % — AB (ref 11.5–15.5)
RDW: 19.1 % — ABNORMAL HIGH (ref 11.5–15.5)
WBC: 5.6 10*3/uL (ref 4.0–10.5)
WBC: 5.7 10*3/uL (ref 4.0–10.5)

## 2017-07-02 LAB — GLUCOSE, CAPILLARY
GLUCOSE-CAPILLARY: 194 mg/dL — AB (ref 65–99)
GLUCOSE-CAPILLARY: 142 mg/dL — AB (ref 65–99)
Glucose-Capillary: 163 mg/dL — ABNORMAL HIGH (ref 65–99)
Glucose-Capillary: 180 mg/dL — ABNORMAL HIGH (ref 65–99)

## 2017-07-02 LAB — RENAL FUNCTION PANEL
ALBUMIN: 3 g/dL — AB (ref 3.5–5.0)
ANION GAP: 8 (ref 5–15)
Albumin: 2.9 g/dL — ABNORMAL LOW (ref 3.5–5.0)
Anion gap: 7 (ref 5–15)
BUN: 40 mg/dL — ABNORMAL HIGH (ref 6–20)
BUN: 36 mg/dL — AB (ref 6–20)
CALCIUM: 8.3 mg/dL — AB (ref 8.9–10.3)
CHLORIDE: 100 mmol/L — AB (ref 101–111)
CO2: 26 mmol/L (ref 22–32)
CO2: 28 mmol/L (ref 22–32)
CREATININE: 2.06 mg/dL — AB (ref 0.61–1.24)
Calcium: 8.5 mg/dL — ABNORMAL LOW (ref 8.9–10.3)
Chloride: 101 mmol/L (ref 101–111)
Creatinine, Ser: 2.12 mg/dL — ABNORMAL HIGH (ref 0.61–1.24)
GFR calc non Af Amer: 33 mL/min — ABNORMAL LOW (ref >60)
GFR calc non Af Amer: 34 mL/min — ABNORMAL LOW (ref >60)
GFR, EST AFRICAN AMERICAN: 38 mL/min — AB (ref >60)
GFR, EST AFRICAN AMERICAN: 39 mL/min — AB (ref >60)
GLUCOSE: 169 mg/dL — AB (ref 65–99)
Glucose, Bld: 128 mg/dL — ABNORMAL HIGH (ref 65–99)
PHOSPHORUS: 2.4 mg/dL — AB (ref 2.5–4.6)
PHOSPHORUS: 2.3 mg/dL — AB (ref 2.5–4.6)
POTASSIUM: 4.2 mmol/L (ref 3.5–5.1)
POTASSIUM: 4.6 mmol/L (ref 3.5–5.1)
Sodium: 135 mmol/L (ref 135–145)
Sodium: 135 mmol/L (ref 135–145)

## 2017-07-02 LAB — OCCULT BLOOD X 1 CARD TO LAB, STOOL: Fecal Occult Bld: POSITIVE — AB

## 2017-07-02 LAB — MAGNESIUM: Magnesium: 2.5 mg/dL — ABNORMAL HIGH (ref 1.7–2.4)

## 2017-07-02 MED ORDER — INSULIN ASPART 100 UNIT/ML ~~LOC~~ SOLN
0.0000 [IU] | Freq: Three times a day (TID) | SUBCUTANEOUS | Status: DC
  Administered 2017-07-02: 2 [IU] via SUBCUTANEOUS
  Administered 2017-07-02 – 2017-07-03 (×3): 3 [IU] via SUBCUTANEOUS
  Administered 2017-07-04 (×2): 5 [IU] via SUBCUTANEOUS
  Administered 2017-07-05: 3 [IU] via SUBCUTANEOUS
  Administered 2017-07-05 – 2017-07-06 (×2): 5 [IU] via SUBCUTANEOUS
  Administered 2017-07-06: 3 [IU] via SUBCUTANEOUS
  Administered 2017-07-06: 15 [IU] via SUBCUTANEOUS
  Administered 2017-07-08: 3 [IU] via SUBCUTANEOUS
  Administered 2017-07-08 – 2017-07-12 (×4): 2 [IU] via SUBCUTANEOUS
  Administered 2017-07-14 (×2): 3 [IU] via SUBCUTANEOUS

## 2017-07-02 MED ORDER — HYDROXYZINE HCL 10 MG PO TABS
10.0000 mg | ORAL_TABLET | Freq: Three times a day (TID) | ORAL | Status: DC | PRN
  Administered 2017-07-02 – 2017-07-04 (×2): 10 mg via ORAL
  Filled 2017-07-02 (×3): qty 1

## 2017-07-02 NOTE — Plan of Care (Signed)
Discussed with Dr Aundra Dubin, cardiology service will assume care for patient, [name]. I will sign off.    Estill Cotta M.D. [facility] 07/02/2017, 11:26 AM  Pager: [phone]

## 2017-07-02 NOTE — Progress Notes (Signed)
Pt has much trouble peeing in urinal to keep strict output.  Scrotum and penis very swollen.  When pt is peeing it is not a lot of urine but I am unable to accurately measure it.

## 2017-07-02 NOTE — Progress Notes (Signed)
  Paged for 2 BMs with obvious blood. FOBT ordered and to be collected. Recheck CBC.  INR already held with large jump in INR.     Legrand Como [address] [place], PA-C 07/02/2017 3:50 PM

## 2017-07-02 NOTE — Progress Notes (Signed)
Pt has had 3 bloody bowel movements today with bright red blood.  MD aware.  Occult lab sent down to lab for testing. CBC checked with 1600 renal panel.  Will continue to monitor closely and update as needed.

## 2017-07-02 NOTE — Progress Notes (Signed)
Patient ID: Michael [name], male   DOB: [birth year], 56 y.o.   MRN: [id number]     Advanced Heart Failure Rounding Note  Primary Cardiologist: Dr. Harrington Challenger HF Cardiology: Luanna Cole  Subjective:    Started CVVHD 8/15 and stopped milrinone. Pulling 150 ml an hour. BP stable.  Weight down 3 lbs.  Breathing slowly improving.  CVP reading 30 but sitting in chair.    Echo (7/18): EF 50-55%, mild LVH, mild AI, bioprosthetic mitral valve appeared to function normally, RV moderately dilated with moderately decreased systolic function, D-shaped interventricular septum suggestive of RV pressure/volume overload, severe TR, PASP 54 mmHg  RHC Procedural Findings: Hemodynamics (mmHg) RA mean 27 RV 63/8, mean 20 PA 58/28, mean 40 PCWP mean 28 Oxygen saturations: PA 69% AO 96% Cardiac Output (Fick) 9.82  Cardiac Index (Fick) 4.96 PVR 1.2 WU  Objective:   Weight Range: 200 lb 2.8 oz (90.8 kg) Body mass index is 33.31 kg/m.   Vital Signs:   Temp:  [97.2 F (36.2 C)-98.1 F (36.7 C)] 97.2 F (36.2 C) (08/16 0300) Pulse Rate:  [45-119] 86 (08/16 0500) Resp:  [11-18] 16 (08/16 0500) BP: (106-152)/(62-94) 115/85 (08/16 0500) SpO2:  [96 %-100 %] 97 % (08/16 0500) Weight:  [200 lb 2.8 oz (90.8 kg)] 200 lb 2.8 oz (90.8 kg) (08/16 0400) Last BM Date: 07/01/17  Weight change: Filed Weights   06/30/17 1341 07/01/17 0400 07/02/17 0400  Weight: 207 lb 14.3 oz (94.3 kg) 203 lb 14.8 oz (92.5 kg) 200 lb 2.8 oz (90.8 kg)    Intake/Output:   Intake/Output Summary (Last 24 hours) at 07/02/17 0655 Last data filed at 07/02/17 0600  Gross per 24 hour  Intake             1310 ml  Output             5017 ml  Net            -3707 ml      Physical Exam    General: NAD Neck: JVP 16+ cm, no thyromegaly or thyroid nodule.  Lungs: Clear to auscultation bilaterally with normal respiratory effort. CV: Nondisplaced PMI.  Heart irregular S1/S2, no S3/S4, 2/6 HSM LLSB.  2+ edema to knees.   Abdomen:  Soft, nontender, no hepatosplenomegaly, no distention.  Skin: Intact without lesions or rashes.  Neurologic: Alert and oriented x 3.  Psych: Normal affect. Extremities: No clubbing or cyanosis.  HEENT: Normal.    Telemetry   Atrial fibrillation 80s - personally reviewed.   Labs    CBC  Recent Labs  07/01/17 0436 07/02/17 0511  WBC 4.8 5.6  HGB 7.9* 7.7*  HCT 23.8* 24.4*  MCV 75.1* 77.5*  PLT 193 469   Basic Metabolic Panel  Recent Labs  07/01/17 0436 07/02/17 0615  NA 134*  134* 135  K 4.3  4.2 4.2  CL 97*  98* 101  CO2 27  27 26   GLUCOSE 139*  139* 169*  BUN 56*  56* 40*  CREATININE 2.66*  2.68* 2.12*  CALCIUM 8.6*  8.6* 8.3*  MG 2.6* 2.5*  PHOS 4.0 2.4*   Liver Function Tests  Recent Labs  07/01/17 0436 07/02/17 0615  ALBUMIN 3.1* 2.9*   No results for input(s): LIPASE, AMYLASE in the last 72 hours. Cardiac Enzymes No results for input(s): CKTOTAL, CKMB, CKMBINDEX, TROPONINI in the last 72 hours.  BNP: BNP (last 3 results)  Recent Labs  10/05/16 0147 05/30/17 1208 06/25/17 1253  BNP 237.4*  1,404.9* 875.7*    ProBNP (last 3 results)  Recent Labs  03/16/17 1401  PROBNP 6,530*        Imaging   Transthoracic Echocardiography 06/01/17  Study Conclusions  - Left ventricle: The cavity size was normal. There was mild   concentric hypertrophy. Systolic function was normal. The   estimated ejection fraction was in the range of 50% to 55%. Wall   motion was normal; there were no regional wall motion   abnormalities. - Ventricular septum: The contour showed diastolic flattening. - Aortic valve: Transvalvular velocity was minimally increased.   There was no stenosis. There was mild regurgitation. Peak   velocity (S): 201 cm/s. - Mitral valve: A bioprosthesis was present and functioning   normally. Mean gradient (D): 6 mm Hg. Valve area by pressure   half-time: 1.93 cm^2. - Left atrium: The atrium was severely dilated. -  Right ventricle: The cavity size was moderately dilated. Wall   thickness was normal. Systolic function was moderately reduced. - Right atrium: The atrium was moderately dilated. - Tricuspid valve: There was severe regurgitation. - Pulmonary arteries: Systolic pressure was moderately increased.   PA peak pressure: 54 mm Hg (S).   RIGHT HEART CATH 06/29/17 RA mean 27 RV 63/8, mean 20 PA 58/28, mean 40 PCWP mean 28  Oxygen saturations: PA 69% AO 96%  Cardiac Output (Fick) 9.82  Cardiac Index (Fick) 4.96 PVR 1.2 WU     Medications:     Scheduled Medications: . amiodarone  400 mg Oral BID  . atorvastatin  20 mg Oral Daily  . carvedilol  3.125 mg Oral BID WC  . Chlorhexidine Gluconate Cloth  6 each Topical Daily  . ferrous sulfate  325 mg Oral BID WC  . folic acid  1 mg Oral Daily  . insulin aspart  0-5 Units Subcutaneous QHS  . insulin aspart  0-9 Units Subcutaneous TID WC  . insulin detemir  10 Units Subcutaneous Q2200  . pantoprazole  40 mg Oral Daily  . predniSONE  40 mg Oral Q breakfast  . sodium chloride  2 spray Each Nare BID  . sodium chloride flush  10-40 mL Intracatheter Q12H  . Warfarin - Pharmacist Dosing Inpatient   Does not apply q1800    Infusions: . sodium chloride Stopped (07/01/17 1500)  . heparin    . dialysis replacement fluid (prismasate) 300 mL/hr at 07/02/17 0215  . dialysis replacement fluid (prismasate) 300 mL/hr at 07/02/17 0215  . 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 Patient underwent mitral valve replacement and tricuspid valve repair in [place] in 2012.    Assessment/Plan   1. Acute on chronic diastolic CHF: EF 82-42% on echo in 7/18 with prominent RV failure (enlarged/hypokinetic RV with D-shaped interventricular septum), severe TR.  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  Milrinone was started at admission for RV support, stopped now that he is getting CVVH. He remains very volume overloaded.  - Continue CVVH, CVP remains quite high.  UF  currently at 150 cc/hr. - Continue Coreg 3.125 mg BID.  2. AKI on CKD stage IV: Now on CVVH and off milrinone.   3. Atrial fibrillation: Persistent.  Has been in atrial fibrillation for a number of months it appears.   I am not sure how well he will stay in NSR given severe TR and RV failure (even with amiodarone), but given the severity of his HF, I think that it is worth a trial.  - Continue warfarin.    - Continue Amio 400 mg BID.  - Plan for TEE and DCCV when volume status is better.    4. Anemia: No overt GI bleeding, hgb stably low.  Not Fe or B12 deficient.  Await FOBT.  ?Anemia of chronic disease/renal disease. - Started on ESA.  - Hgb 7.7 today, fairly stable.  5. Bioprosthetic mitral valve: Stable appearance on 7/18 echo.  6. Probable sleep apnea: Will need sleep study outpatient.  - No change.   Length of Stay: [place], MD  07/02/2017, 6:55 AM  Advanced Heart Failure Team Pager [phone] (M-F; 7a - 4p)  Please contact [facility] for night-coverage after hours (4p -7a ) and weekends on [email or web address]

## 2017-07-02 NOTE — Progress Notes (Signed)
Physical Therapy Treatment Patient Details Name: Michael [name] MRN: [id number] DOB: Feb 27, 1961 Today's Date: 07/02/2017    History of Present Illness HPI: Michael [name] is a 56 y.o. male with medical history significant of anemia of chronic disease, chronic kidney disease, systolic heart failure, diabetes mellitus, GERD, history of tricuspid valve repair, essential hypertension, atrial fibrillation, pulmonary hypertension. Patient presented from his PCPs office for evaluation of anasarca. Acute kidney injury as well with cardiorenal syndrome. Pt currently on CRRT (began 8/14).     PT Comments    Pt agreeable to mobilize with PT. Nursing indicated pt had already been out of bed to the chair this am. Pt requires minimal assist for mobility and shows functional extremity strength with completion of exercises; however mobility is currently limited due to CRRT and impaired balance. Pt able to complete standing exercises this session; however had one LOB during standing marching with use of bed rail. Will continue to follow for increased mobilization and balance training for progression.   Vitals:  HR ranging from 82-100  BP 117/77   Follow Up Recommendations  Home health PT     Equipment Recommendations       Recommendations for Other Services       Precautions / Restrictions Precautions Precautions: Fall Restrictions Weight Bearing Restrictions: No    Mobility  Bed Mobility Overal bed mobility: Needs Assistance Bed Mobility: Supine to Sit;Sit to Supine     Supine to sit: Modified independent (Device/Increase time);HOB elevated Sit to supine: Min assist   General bed mobility comments: Supine to sit: Mod I with use of bed rails and HOB elevated. Pt with increased time and VCs for sequencing. Sit to supine: Min A to bring LEs up onto bed and scooting up with chuck pad.   Transfers Overall transfer level: Needs assistance   Transfers: Sit to/from Stand Sit to  Stand: Min guard         General transfer comment: Min guard for safety   Ambulation/Gait Ambulation/Gait assistance: Min guard Ambulation Distance (Feet): 32 Feet Assistive device: None   Gait velocity: decreased    General Gait Details: Pt performed side stepping at EOB 71ft x 4 (32 ft) with [employer] for safety. Ambulation limited due to CRRT.    Stairs            Wheelchair Mobility    Modified Rankin (Stroke Patients Only)       Balance Overall balance assessment: Needs assistance Sitting-balance support: Bilateral upper extremity supported;Feet unsupported Sitting balance-Leahy Scale: Good     Standing balance support: No upper extremity supported;During functional activity Standing balance-Leahy Scale: Fair Standing balance comment: Pt able to stand statically and side step on own without physical assistance or UE support. However, pt had LOB when attempting to perform high knees in standing                             Cognition Arousal/Alertness: Awake/alert Behavior During Therapy: WFL for tasks assessed/performed Overall Cognitive Status: Within Functional Limits for tasks assessed                                        Exercises General Exercises - Lower Extremity Long Arc Quad: AROM;Both;Seated;20 reps Heel Slides: AROM;Both;Supine;20 reps Hip ABduction/ADduction: AROM;Both;Supine;20 reps Straight Leg Raises: AROM;Both;20 reps;Supine Hip Flexion/Marching: AROM;Both;20 reps;Seated;Standing (20 reps standing + 20  reps seated)    General Comments        Pertinent Vitals/Pain Pain Assessment: No/denies pain    Home Living                      Prior Function            PT Goals (current goals can now be found in the care plan section) Acute Rehab PT Goals Patient Stated Goal: walk PT Goal Formulation: With patient Time For Goal Achievement: 07/16/17 Potential to Achieve Goals: Good Progress towards  PT goals: Progressing toward goals    Frequency    Min 3X/week      PT Plan Current plan remains appropriate    Co-evaluation              AM-PAC PT "6 Clicks" Daily Activity  Outcome Measure  Difficulty turning over in bed (including adjusting bedclothes, sheets and blankets)?: None Difficulty moving from lying on back to sitting on the side of the bed? : Total Difficulty sitting down on and standing up from a chair with arms (e.g., wheelchair, bedside commode, etc,.)?: A Little Help needed moving to and from a bed to chair (including a wheelchair)?: None Help needed walking in hospital room?: A Little Help needed climbing 3-5 steps with a railing? : A Little 6 Click Score: 18    End of Session   Activity Tolerance: Patient tolerated treatment well Patient left: with bed alarm set;in bed;with call bell/phone within reach Nurse Communication: Mobility status PT Visit Diagnosis: Unsteadiness on feet (R26.81);Muscle weakness (generalized) (M62.81)     Time: 7078-6754 PT Time Calculation (min) (ACUTE ONLY): 23 min  Charges:  $Therapeutic Exercise: 8-22 mins $Therapeutic Activity: 8-22 mins                    G Codes:       Elberta Leatherwood, SPT Acute Rehab [place] 07/02/2017, 11:05 AM

## 2017-07-02 NOTE — Progress Notes (Signed)
[place] for warfarin Indication: atrial fibrillation  No Known Allergies  Labs:  Recent Labs  06/30/17 0321 06/30/17 1454 07/01/17 0436 07/02/17 0511 07/02/17 0615  HGB 7.4*  --  7.9* 7.7*  --   HCT 22.2*  --  23.8* 24.4*  --   PLT 165  --  193 189  --   LABPROT 26.8*  --  25.5* 34.4*  --   INR 2.42  --  2.27 3.32  --   CREATININE 3.31* 3.39* 2.66*  2.68*  --  2.12*    Estimated Creatinine Clearance: 39.8 mL/min (A) (by C-G formula based on SCr of 2.12 mg/dL (H)). Pt on warfarin PTA for afib/MVR (bioprosthetic).   INR this am is up 2.2>>3.3, now s/p RHC indicating RV failure, milrinone stopped. CRRT started 8/14. Hgb down slightly to 7.7, no bleeding noted.  PTA warfarin dose was 2.5mg  daily  Goal of Therapy:  INR 2-3  Monitor platelets by anticoagulation protocol: Yes   Plan:  -Hold warfarin given INR trend up -Daily INR for now  Erin Hearing PharmD., BCPS Clinical Pharmacist Pager [phone] 07/02/2017 7:49 AM

## 2017-07-02 NOTE — Progress Notes (Signed)
Patient ID: Michael [name], male   DOB: 01/24/1961, 56 y.o.   MRN: [id number] S:Feeling better O:BP 117/88   Pulse 88   Temp 98.4 F (36.9 C) (Oral)   Resp (!) 25   Ht 5\' 5"  (1.651 m)   Wt 90.8 kg (200 lb 2.8 oz)   SpO2 100%   BMI 33.31 kg/m   Intake/Output Summary (Last 24 hours) at 07/02/17 0917 Last data filed at 07/02/17 0800  Gross per 24 hour  Intake             1190 ml  Output             4705 ml  Net            -3515 ml   Intake/Output: I/O last 3 completed shifts: In: 2706 [P.O.:1370; I.V.:200] Out: 2376 [Urine:600; Other:6711]  Intake/Output this shift:  Total I/O In: 150 [P.O.:150] Out: 158 [Other:158] Weight change: -3.5 kg (-7 lb 11.5 oz) Gen:WD NAD CVS:irr irr Resp:decreased BS at bases Abd:+BS, tense with pitting edema  EGB:TDVVOHY   Recent Labs Lab 06/25/17 1252  06/27/17 0337 06/28/17 0302 06/29/17 0242 06/30/17 0321 06/30/17 1454 07/01/17 0436 07/02/17 0615  NA  --   < > 138 137 135 133* 132* 134*  134* 135  K  --   < > 3.8 3.5 3.6 4.2 4.3 4.3  4.2 4.2  CL  --   < > 103 102 98* 96* 95* 97*  98* 101  CO2  --   < > 26 27 27 26 25 27  27 26   GLUCOSE  --   < > 139* 139* 151* 120* 148* 139*  139* 169*  BUN  --   < > 52* 51* 53* 62* 68* 56*  56* 40*  CREATININE  --   < > 2.72* 2.68* 2.73* 3.31* 3.39* 2.66*  2.68* 2.12*  ALBUMIN 3.0*  --   --   --   --   --  2.9* 3.1* 2.9*  CALCIUM  --   < > 8.3* 8.3* 8.4* 8.5* 8.4* 8.6*  8.6* 8.3*  PHOS  --   --   --   --   --   --  4.7* 4.0 2.4*  AST 31  --   --   --   --   --   --   --   --   ALT 13*  --   --   --   --   --   --   --   --   < > = values in this interval not displayed. Liver Function Tests:  Recent Labs Lab 06/25/17 1252 06/30/17 1454 07/01/17 0436 07/02/17 0615  AST 31  --   --   --   ALT 13*  --   --   --   ALKPHOS 113  --   --   --   BILITOT 1.8*  --   --   --   PROT 7.2  --   --   --   ALBUMIN 3.0* 2.9* 3.1* 2.9*   No results for input(s): LIPASE, AMYLASE in the last  168 hours. No results for input(s): AMMONIA in the last 168 hours. CBC:  Recent Labs Lab 06/28/17 0302 06/29/17 0242 06/30/17 0321 07/01/17 0436 07/02/17 0511  WBC 4.9 5.1 4.9 4.8 5.6  HGB 7.3* 7.3* 7.4* 7.9* 7.7*  HCT 22.3* 21.8* 22.2* 23.8* 24.4*  MCV 75.1* 74.1* 73.8* 75.1* 77.5*  PLT 163 153 165  193 189   Cardiac Enzymes: No results for input(s): CKTOTAL, CKMB, CKMBINDEX, TROPONINI in the last 168 hours. CBG:  Recent Labs Lab 07/01/17 0722 07/01/17 1130 07/01/17 1629 07/01/17 2137 07/02/17 0734  GLUCAP 135* 190* 217* 155* 194*    Iron Studies: No results for input(s): IRON, TIBC, TRANSFERRIN, FERRITIN in the last 72 hours. Studies/Results: Dg Chest Port 1 View  Result Date: 06/30/2017 CLINICAL DATA:  56 year old male status post placement of a non tunneled hemodialysis catheter EXAM: PORTABLE CHEST 1 VIEW COMPARISON:  Prior chest x-ray 06/25/2017 FINDINGS: Right IJ approach non tunneled hemodialysis catheter with the tip overlying the distal SVC. Marked cardiomegaly is similar compared to prior. Patient is status post median sternotomy with evidence of mitral valve replacement. Trace atherosclerotic calcifications in the transverse aorta. No evidence of pneumothorax. Persistent pulmonary vascular congestion. Small right-sided pleural effusion, perhaps slightly increased compared to yesterday. IMPRESSION: 1. New right IJ approach non tunneled hemodialysis catheter. The catheter tip overlies the distal SVC. 2. Similar to slightly enlarged layering right pleural effusion. 3. Stable cardiomegaly. 4.  Aortic Atherosclerosis (ICD10-170.0) Electronically Signed   By: Jacqulynn Cadet M.D.   On: 06/30/2017 14:15   . amiodarone  400 mg Oral BID  . atorvastatin  20 mg Oral Daily  . carvedilol  3.125 mg Oral BID WC  . Chlorhexidine Gluconate Cloth  6 each Topical Daily  . ferrous sulfate  325 mg Oral BID WC  . folic acid  1 mg Oral Daily  . insulin aspart  0-5 Units Subcutaneous  QHS  . insulin aspart  0-9 Units Subcutaneous TID WC  . insulin detemir  10 Units Subcutaneous Q2200  . pantoprazole  40 mg Oral Daily  . predniSONE  40 mg Oral Q breakfast  . sodium chloride  2 spray Each Nare BID  . sodium chloride flush  10-40 mL Intracatheter Q12H  . Warfarin - Pharmacist Dosing Inpatient   Does not apply q1800    BMET    Component Value Date/Time   NA 135 07/02/2017 0615   NA 140 03/30/2017 1406   K 4.2 07/02/2017 0615   CL 101 07/02/2017 0615   CO2 26 07/02/2017 0615   GLUCOSE 169 (H) 07/02/2017 0615   BUN 40 (H) 07/02/2017 0615   BUN 33 (H) 03/30/2017 1406   CREATININE 2.12 (H) 07/02/2017 0615   CREATININE 1.61 (H) 01/16/2017 1653   CALCIUM 8.3 (L) 07/02/2017 0615   GFRNONAA 33 (L) 07/02/2017 0615   GFRNONAA 47 (L) 01/16/2017 1653   GFRAA 38 (L) 07/02/2017 0615   GFRAA 54 (L) 01/16/2017 1653   CBC    Component Value Date/Time   WBC 5.6 07/02/2017 0511   RBC 3.15 (L) 07/02/2017 0511   HGB 7.7 (L) 07/02/2017 0511   HCT 24.4 (L) 07/02/2017 0511   PLT 189 07/02/2017 0511   MCV 77.5 (L) 07/02/2017 0511   MCH 24.4 (L) 07/02/2017 0511   MCHC 31.6 07/02/2017 0511   RDW 21.4 (H) 07/02/2017 0511   LYMPHSABS 1.1 02/21/2017 1846   MONOABS 0.7 02/21/2017 1846   EOSABS 0.3 02/21/2017 1846   BASOSABS 0.1 02/21/2017 1846     Assessment/Plan:  1. 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 Now not responding to high dose IV lasix and metolazone with bump in BUN/Cr. 1. Initiated CVVHD with UF on 06/30/17 and has done well with it and is now over 6 liters negative but still remains volume  overloaded. 2. Continue with CVVHDF for now.  Unfortunately his UOP has dropped off significantly. 2. 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 Heart failure team has pt on milrinone. He  initially was diuresing with lasix 120mg  tid and metolazone but then his UOP markedly dropped off and started on CVVHDF.   1. Negative 6.7liters over the last 2 days.  2. CVVHD as above and continue to follow I's/O's, daily weights, and Scr. 3. Atrial fib- persistent 4. Anemia- microcytic. Iron stores acceptable, will need to initiate ESA. 5. Bioprosthetic MV- stable   [name], MD [facility] [phone]

## 2017-07-02 NOTE — Care Management Note (Signed)
Case Management Note  Patient Details  Name: Michael [name] MRN: [id number] Date of Birth: 02-18-1961  Subjective/Objective:    From home alone, hea has a daughter who works and goes to school every day, Patient is established with [facility] will reschedule patient's follow up appt when he is near discharge. He is also active with [facility], and HHPT, also will be followed by [facility].  He has Acute /Chronic CHF, AKI, CKD IV, afib,  Anemia, bioprosthetic MV.  CVP remains elevated , HD cath place 8/14 and CRRT started for volume removal.  Plan for TEE and DCCV when volume is better per MD  Note.      8/16 [place], BSN - NCM spoke with patient and informed him that he will be with [facility] for [facility] , he states that is fine ,Bethena Roys is aware. He will need daily weights and if needed iv lasix at home, Per MD they do give iv lasix thru [facility] agency at home.  He conts to receive CRRT today                Action/Plan:   Expected Discharge Date:                  Expected Discharge Plan:  [place]  In-House Referral:  NA  Discharge planning Services  CM Consult, Follow-up appt scheduled  Post Acute Care Choice:  Home Health Choice offered to:  Patient  DME Arranged:    DME Agency:     HH Arranged:  RN, PT [place] Agency:  [place]  Status of Service:  In process, will continue to follow  If discussed at Long Length of Stay Meetings, dates discussed:    Additional Comments:  [name], RN 07/02/2017, 3:20 PM

## 2017-07-03 ENCOUNTER — Inpatient Hospital Stay: Payer: Medicare Other | Admitting: Family Medicine

## 2017-07-03 ENCOUNTER — Encounter (HOSPITAL_COMMUNITY): Payer: Self-pay

## 2017-07-03 ENCOUNTER — Ambulatory Visit: Payer: Self-pay

## 2017-07-03 DIAGNOSIS — I509 Heart failure, unspecified: Secondary | ICD-10-CM

## 2017-07-03 LAB — RENAL FUNCTION PANEL
Albumin: 3.1 g/dL — ABNORMAL LOW (ref 3.5–5.0)
Albumin: 3.2 g/dL — ABNORMAL LOW (ref 3.5–5.0)
Anion gap: 6 (ref 5–15)
Anion gap: 8 (ref 5–15)
BUN: 35 mg/dL — AB (ref 6–20)
BUN: 31 mg/dL — ABNORMAL HIGH (ref 6–20)
CALCIUM: 8.1 mg/dL — AB (ref 8.9–10.3)
CHLORIDE: 102 mmol/L (ref 101–111)
CO2: 26 mmol/L (ref 22–32)
CO2: 24 mmol/L (ref 22–32)
CREATININE: 2.01 mg/dL — AB (ref 0.61–1.24)
Calcium: 8.5 mg/dL — ABNORMAL LOW (ref 8.9–10.3)
Chloride: 101 mmol/L (ref 101–111)
Creatinine, Ser: 2.04 mg/dL — ABNORMAL HIGH (ref 0.61–1.24)
GFR, EST AFRICAN AMERICAN: 40 mL/min — AB (ref >60)
GFR, EST AFRICAN AMERICAN: 41 mL/min — AB (ref >60)
GFR, EST NON AFRICAN AMERICAN: 34 mL/min — AB (ref >60)
GFR, EST NON AFRICAN AMERICAN: 35 mL/min — AB (ref >60)
Glucose, Bld: 121 mg/dL — ABNORMAL HIGH (ref 65–99)
Glucose, Bld: 205 mg/dL — ABNORMAL HIGH (ref 65–99)
POTASSIUM: 4.4 mmol/L (ref 3.5–5.1)
Phosphorus: 2.2 mg/dL — ABNORMAL LOW (ref 2.5–4.6)
Phosphorus: 2 mg/dL — ABNORMAL LOW (ref 2.5–4.6)
Potassium: 4.5 mmol/L (ref 3.5–5.1)
SODIUM: 133 mmol/L — AB (ref 135–145)
Sodium: 134 mmol/L — ABNORMAL LOW (ref 135–145)

## 2017-07-03 LAB — GLUCOSE, CAPILLARY
GLUCOSE-CAPILLARY: 105 mg/dL — AB (ref 65–99)
Glucose-Capillary: 147 mg/dL — ABNORMAL HIGH (ref 65–99)
Glucose-Capillary: 197 mg/dL — ABNORMAL HIGH (ref 65–99)
Glucose-Capillary: 193 mg/dL — ABNORMAL HIGH (ref 65–99)
Glucose-Capillary: 177 mg/dL — ABNORMAL HIGH (ref 65–99)

## 2017-07-03 LAB — PROTIME-INR
INR: 2.88
Prothrombin Time: 30.7 seconds — ABNORMAL HIGH (ref 11.4–15.2)

## 2017-07-03 LAB — CBC
HEMATOCRIT: 25 % — AB (ref 39.0–52.0)
Hemoglobin: 8.2 g/dL — ABNORMAL LOW (ref 13.0–17.0)
MCH: 24.9 pg — AB (ref 26.0–34.0)
MCHC: 32.8 g/dL (ref 30.0–36.0)
MCV: 76 fL — AB (ref 78.0–100.0)
Platelets: 208 10*3/uL (ref 150–400)
RBC: 3.29 MIL/uL — ABNORMAL LOW (ref 4.22–5.81)
RDW: 19.4 % — AB (ref 11.5–15.5)
WBC: 7 10*3/uL (ref 4.0–10.5)

## 2017-07-03 LAB — URINALYSIS, ROUTINE W REFLEX MICROSCOPIC
BILIRUBIN URINE: NEGATIVE
GLUCOSE, UA: NEGATIVE mg/dL
KETONES UR: NEGATIVE mg/dL
NITRITE: NEGATIVE
Protein, ur: 100 mg/dL — AB
Specific Gravity, Urine: 1.016 (ref 1.005–1.030)
Squamous Epithelial / LPF: NONE SEEN
pH: 5 (ref 5.0–8.0)

## 2017-07-03 LAB — MAGNESIUM: MAGNESIUM: 2.5 mg/dL — AB (ref 1.7–2.4)

## 2017-07-03 LAB — CULTURE, BLOOD (ROUTINE X 2)
CULTURE: NO GROWTH
CULTURE: NO GROWTH
SPECIAL REQUESTS: ADEQUATE
Special Requests: ADEQUATE

## 2017-07-03 MED ORDER — WARFARIN SODIUM 1 MG PO TABS
1.0000 mg | ORAL_TABLET | Freq: Once | ORAL | Status: AC
  Administered 2017-07-03: 1 mg via ORAL
  Filled 2017-07-03: qty 1

## 2017-07-03 NOTE — Progress Notes (Signed)
Patient ID: Landry Kamath, male   DOB: 12-02-1960, 56 y.o.   MRN: [id number] S: Feeling better O:BP (!) 110/100   Pulse 82   Temp (!) 97.4 F (36.3 C) (Oral)   Resp 13   Ht 5\' 5"  (1.651 m)   Wt 87.7 kg (193 lb 5.5 oz)   SpO2 100%   BMI 32.17 kg/m   Intake/Output Summary (Last 24 hours) at 07/03/17 0947 Last data filed at 07/03/17 9983  Gross per 24 hour  Intake              850 ml  Output             7033 ml  Net            -6183 ml   Intake/Output: I/O last 3 completed shifts: In: 990 [P.O.:990] Out: 8943 [JASNK:[id number]]  Intake/Output this shift:  Total I/O In: 250 [P.O.:240; I.V.:10] Out: 679 [Other:679] Weight change: -3.1 kg (-6 lb 13.4 oz) Gen: fatigued CVS: no rub Resp:decreased BS at bases Abd:+BS, +abd wall edema Ext:+ anasarca   Recent Labs Lab 06/28/17 0302 06/29/17 0242 06/30/17 0321 06/30/17 1454 07/01/17 0436 07/02/17 0615 07/02/17 1552  NA 137 135 133* 132* 134*  134* 135 135  K 3.5 3.6 4.2 4.3 4.3  4.2 4.2 4.6  CL 102 98* 96* 95* 97*  98* 101 100*  CO2 27 27 26 25 27  27 26 28   GLUCOSE 139* 151* 120* 148* 139*  139* 169* 128*  BUN 51* 53* 62* 68* 56*  56* 40* 36*  CREATININE 2.68* 2.73* 3.31* 3.39* 2.66*  2.68* 2.12* 2.06*  ALBUMIN  --   --   --  2.9* 3.1* 2.9* 3.0*  CALCIUM 8.3* 8.4* 8.5* 8.4* 8.6*  8.6* 8.3* 8.5*  PHOS  --   --   --  4.7* 4.0 2.4* 2.3*   Liver Function Tests:  Recent Labs Lab 07/01/17 0436 07/02/17 0615 07/02/17 1552  ALBUMIN 3.1* 2.9* 3.0*   No results for input(s): LIPASE, AMYLASE in the last 168 hours. No results for input(s): AMMONIA in the last 168 hours. CBC:  Recent Labs Lab 06/30/17 0321 07/01/17 0436 07/02/17 0511 07/02/17 1552 07/03/17 0446  WBC 4.9 4.8 5.6 5.7 7.0  HGB 7.4* 7.9* 7.7* 8.1* 8.2*  HCT 22.2* 23.8* 24.4* 24.9* 25.0*  MCV 73.8* 75.1* 77.5* 76.1* 76.0*  PLT 165 193 189 205 208   Cardiac Enzymes: No results for input(s): CKTOTAL, CKMB, CKMBINDEX, TROPONINI in the last 168  hours. CBG:  Recent Labs Lab 07/02/17 1108 07/02/17 1339 07/02/17 1637 07/02/17 2223 07/03/17 0723  GLUCAP 163* 180* 147* 142* 105*    Iron Studies: No results for input(s): IRON, TIBC, TRANSFERRIN, FERRITIN in the last 72 hours. Studies/Results: No results found. Marland Kitchen amiodarone  400 mg Oral BID  . atorvastatin  20 mg Oral Daily  . carvedilol  3.125 mg Oral BID WC  . Chlorhexidine Gluconate Cloth  6 each Topical Daily  . ferrous sulfate  325 mg Oral BID WC  . folic acid  1 mg Oral Daily  . insulin aspart  0-15 Units Subcutaneous TID WC  . insulin aspart  0-5 Units Subcutaneous QHS  . insulin detemir  10 Units Subcutaneous Q2200  . pantoprazole  40 mg Oral Daily  . predniSONE  40 mg Oral Q breakfast  . sodium chloride  2 spray Each Nare BID  . sodium chloride flush  10-40 mL Intracatheter Q12H  . warfarin  1 mg Oral  ONCE-1800  . Warfarin - Pharmacist Dosing Inpatient   Does not apply q1800    BMET    Component Value Date/Time   NA 135 07/02/2017 1552   NA 140 03/30/2017 1406   K 4.6 07/02/2017 1552   CL 100 (L) 07/02/2017 1552   CO2 28 07/02/2017 1552   GLUCOSE 128 (H) 07/02/2017 1552   BUN 36 (H) 07/02/2017 1552   BUN 33 (H) 03/30/2017 1406   CREATININE 2.06 (H) 07/02/2017 1552   CREATININE 1.61 (H) 01/16/2017 1653   CALCIUM 8.5 (L) 07/02/2017 1552   GFRNONAA 34 (L) 07/02/2017 1552   GFRNONAA 47 (L) 01/16/2017 1653   GFRAA 39 (L) 07/02/2017 1552   GFRAA 54 (L) 01/16/2017 1653   CBC    Component Value Date/Time   WBC 7.0 07/03/2017 0446   RBC 3.29 (L) 07/03/2017 0446   HGB 8.2 (L) 07/03/2017 0446   HCT 25.0 (L) 07/03/2017 0446   PLT 208 07/03/2017 0446   MCV 76.0 (L) 07/03/2017 0446   MCH 24.9 (L) 07/03/2017 0446   MCHC 32.8 07/03/2017 0446   RDW 19.4 (H) 07/03/2017 0446   LYMPHSABS 1.1 02/21/2017 1846   MONOABS 0.7 02/21/2017 1846   EOSABS 0.3 02/21/2017 1846   BASOSABS 0.1 02/21/2017 1846    Assessment/Plan:  1. 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 Failed high dose IV lasix and metolazone with bump in BUN/Cr. 1. Initiated CVVHD with UF on 06/30/17 and has done well with it and is now over 6 liters negative but still remains volume overloaded. 2. Continue with CVVHDF for now as he still remains markedly volume overloaded. 3. Unfortunately his UOP has dropped off significantly. 4. Given his RV failure I don't think he would be able to tolerate IHD well and may need to involve Palliative Care to help set goals/limits of care. 2. 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 Heart failure team has pt on milrinone. He initially was diuresing with lasix 120mg  tid and metolazone but then his UOP markedly dropped off and started on CVVHDF.   1. Negative 6.7liters over the last 2 days.  2. CVVHD as above and continue to follow I's/O's, daily weights, and Scr. 3. Atrial fib- persistent 4. Anemia- microcytic. Iron stores acceptable, will need to initiate ESA. 5. Bioprosthetic MV- stable 6. Disposition- unfortunate 56 yo Asian male with multi-organ failure.  Discussed possible heart-kidney tx with Dr. Haroldine Laws but also recommend palliative care consult to help set goals/limits of care   [name], MD [facility] [phone]

## 2017-07-03 NOTE — Consult Note (Addendum)
   [facility] CM Inpatient Consult   07/03/2017  Michael [name] 07-07-1961 [id number]     [facility] Care Management follow up.   [name] recently signed up with [facility] Management earlier on in this hospitalization.  He is currently in ICU. Will continue to follow along for disposition and progress. Will update [facility] Community team accordingly.  Spoke with covering inpatient RNCM to make aware [facility] is following.    [name], MSN-Ed, RN,BSN [facility] Liaison [phone]

## 2017-07-03 NOTE — Progress Notes (Signed)
Patient ID: Michael [name], male   DOB: July 09, 1961, 56 y.o.   MRN: [id number]     Advanced Heart Failure Rounding Note  Primary Cardiologist: Dr. Harrington Challenger HF Cardiology: Luanna Cole  Subjective:    Started CVVHD 8/15 and stopped milrinone. Pulling 200-300 cc an hour. Weight down 7 lbs.   Feeling good today.  Denies SOB moving around room.  BRBPR noted with stools yesterday. FOBT +. Hgb stable. He states he has had blood in his stools before, but unsure of timeline. Last month he had supra-therapeutic INR with mucosal bleeding.  Echo (7/18): EF 50-55%, mild LVH, mild AI, bioprosthetic mitral valve appeared to function normally, RV moderately dilated with moderately decreased systolic function, D-shaped interventricular septum suggestive of RV pressure/volume overload, severe TR, PASP 54 mmHg  RHC Procedural Findings: Hemodynamics (mmHg) RA mean 27 RV 63/8, mean 20 PA 58/28, mean 40 PCWP mean 28 Oxygen saturations: PA 69% AO 96% Cardiac Output (Fick) 9.82  Cardiac Index (Fick) 4.96 PVR 1.2 WU  Objective:   Weight Range: 193 lb 5.5 oz (87.7 kg) Body mass index is 32.17 kg/m.   Vital Signs:   Temp:  [96 F (35.6 C)-98.4 F (36.9 C)] 98.2 F (36.8 C) (08/17 0300) Pulse Rate:  [36-127] 80 (08/17 0700) Resp:  [8-25] 14 (08/17 0700) BP: (87-144)/(61-104) 106/72 (08/17 0700) SpO2:  [92 %-100 %] 100 % (08/17 0700) Weight:  [193 lb 5.5 oz (87.7 kg)] 193 lb 5.5 oz (87.7 kg) (08/17 0200) Last BM Date: 07/02/17  Weight change: Filed Weights   07/01/17 0400 07/02/17 0400 07/03/17 0200  Weight: 203 lb 14.8 oz (92.5 kg) 200 lb 2.8 oz (90.8 kg) 193 lb 5.5 oz (87.7 kg)    Intake/Output:   Intake/Output Summary (Last 24 hours) at 07/03/17 0722 Last data filed at 07/03/17 0700  Gross per 24 hour  Intake              750 ml  Output             6793 ml  Net            -6043 ml      Physical Exam    General: NAD  HEENT: Normal Neck: Supple. JVP 14-16 cm +,  Carotids 2+  bilat; no bruits. No thyromegaly or nodule noted. Cor: PMI nondisplaced. RRR, 2/6 HSM LLSB Lungs: CTAB, normal effort. Abdomen: Soft, non-tender, non-distended, no HSM. No bruits or masses. +BS  Extremities: No cyanosis, clubbing, or rash. Trace to 1+ edema to knees.   Neuro: Alert & orientedx3, cranial nerves grossly intact. moves all 4 extremities w/o difficulty. Affect pleasant   Telemetry   Personally reviewed, Afib 80s.    Labs    CBC  Recent Labs  07/02/17 1552 07/03/17 0446  WBC 5.7 7.0  HGB 8.1* 8.2*  HCT 24.9* 25.0*  MCV 76.1* 76.0*  PLT 205 710   Basic Metabolic Panel  Recent Labs  07/02/17 0615 07/02/17 1552 07/03/17 0446  NA 135 135  --   K 4.2 4.6  --   CL 101 100*  --   CO2 26 28  --   GLUCOSE 169* 128*  --   BUN 40* 36*  --   CREATININE 2.12* 2.06*  --   CALCIUM 8.3* 8.5*  --   MG 2.5*  --  2.5*  PHOS 2.4* 2.3*  --    Liver Function Tests  Recent Labs  07/02/17 0615 07/02/17 1552  ALBUMIN 2.9* 3.0*   No  results for input(s): LIPASE, AMYLASE in the last 72 hours. Cardiac Enzymes No results for input(s): CKTOTAL, CKMB, CKMBINDEX, TROPONINI in the last 72 hours.  BNP: BNP (last 3 results)  Recent Labs  10/05/16 0147 05/30/17 1208 06/25/17 1253  BNP 237.4* 1,404.9* 875.7*    ProBNP (last 3 results)  Recent Labs  03/16/17 1401  PROBNP 6,530*        Imaging   Transthoracic Echocardiography 06/01/17  Study Conclusions  - Left ventricle: The cavity size was normal. There was mild   concentric hypertrophy. Systolic function was normal. The   estimated ejection fraction was in the range of 50% to 55%. Wall   motion was normal; there were no regional wall motion   abnormalities. - Ventricular septum: The contour showed diastolic flattening. - Aortic valve: Transvalvular velocity was minimally increased.   There was no stenosis. There was mild regurgitation. Peak   velocity (S): 201 cm/s. - Mitral valve: A bioprosthesis  was present and functioning   normally. Mean gradient (D): 6 mm Hg. Valve area by pressure   half-time: 1.93 cm^2. - Left atrium: The atrium was severely dilated. - Right ventricle: The cavity size was moderately dilated. Wall   thickness was normal. Systolic function was moderately reduced. - Right atrium: The atrium was moderately dilated. - Tricuspid valve: There was severe regurgitation. - Pulmonary arteries: Systolic pressure was moderately increased.   PA peak pressure: 54 mm Hg (S).   RIGHT HEART CATH 06/29/17 RA mean 27 RV 63/8, mean 20 PA 58/28, mean 40 PCWP mean 28  Oxygen saturations: PA 69% AO 96%  Cardiac Output (Fick) 9.82  Cardiac Index (Fick) 4.96 PVR 1.2 WU     Medications:     Scheduled Medications: . amiodarone  400 mg Oral BID  . atorvastatin  20 mg Oral Daily  . carvedilol  3.125 mg Oral BID WC  . Chlorhexidine Gluconate Cloth  6 each Topical Daily  . ferrous sulfate  325 mg Oral BID WC  . folic acid  1 mg Oral Daily  . insulin aspart  0-15 Units Subcutaneous TID WC  . insulin aspart  0-5 Units Subcutaneous QHS  . insulin detemir  10 Units Subcutaneous Q2200  . pantoprazole  40 mg Oral Daily  . predniSONE  40 mg Oral Q breakfast  . sodium chloride  2 spray Each Nare BID  . sodium chloride flush  10-40 mL Intracatheter Q12H  . warfarin  1 mg Oral ONCE-1800  . Warfarin - Pharmacist Dosing Inpatient   Does not apply q1800    Infusions: . sodium chloride Stopped (07/01/17 1500)  . heparin    . dialysis replacement fluid (prismasate) 300 mL/hr at 07/02/17 1826  . dialysis replacement fluid (prismasate) 300 mL/hr at 07/02/17 1827  . 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 Patient underwent mitral valve replacement and tricuspid valve repair in [place] in 2012.    Assessment/Plan   1. Acute on chronic diastolic CHF: EF 76-54% on echo in 7/18 with prominent RV failure (enlarged/hypokinetic RV with D-shaped interventricular septum), severe TR.  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  Milrinone was started at admission for RV support, stopped now that he is getting CVVH. He remains very volume overloaded.  - Continue CVVH, CVP remains quite high.  UF currently at > 200 cc/hr. Continue to follow.   - Continue Coreg 3.125 mg BID.  2. AKI on CKD stage IV: Now on CVVH and off milrinone.   - Will have to see if kidneys recover once volume status improved.  3. Atrial fibrillation: Persistent.  Has been in atrial fibrillation for a number of months it appears.   - Not sure how well he will stay in NSR given severe TR and RV failure (even with amiodarone), but given the severity of his HF. Plan for TEE/DCCV when volume has improved. Likely next week.  - Continue warfarin.    - Continue Amio 400 mg BID.  4. Anemia:  - Now with small amount of BRBPR yesterday evening. Hgb stable. Of note, he is having formed, brown stools with blood in the toilet. Suspect hemorrhoidal vs diverticular. Will need GI follow up as outpatient. Consider inpatient consult if worsens of Hgb drops.  - Discussed with MD. Continue to follow closely for now.  Likely  related to jump in INR.  - Not Fe or B12 deficient.  ?Anemia of chronic disease/renal disease. - Started on ESA.  Per renal. (Mentioned in note but no orders thus far) 5. Bioprosthetic mitral valve:  - Stable  appearance on 7/18 echo.   6. Probable sleep apnea:  - Will need sleep study outpatient. No change.    CVP remains markedly elevated. Continue CVVHD.   Length of Stay: [address]  Annamaria Helling  07/03/2017, 7:22 AM  Advanced Heart Failure Team Pager [phone] (M-F; 7a - 4p)  Please contact [facility] for night-coverage after hours (4p -7a ) and weekends on [email or web address]  Patient seen and examined with the above-signed Advanced Practice Provider and/or Housestaff. I personally reviewed laboratory data, imaging studies and relevant notes. I independently examined the patient and formulated the important aspects of the plan. I have edited the note to reflect any of my changes or salient points. I have personally discussed the plan with the patient and/or family.  Responding well to CVVHD but CVP remains very high (~30) in setting of severe RV failure. Will continue volume removal. AF rate well controlled on amio. On warfarin. Plan TEE/DC-CV when volume status improved.   Long-term plan remains quite challenging as I doubt he will tolerate iHD for long with his degree of RV failure. I discussed this with Dr. Rea College who agrees. He lives alone and doesn;t have much help with his healthcare so I think chance at heart-kidney transplant would be very low. Hopefully renal function will improve.   Glori Bickers, MD  2:33 PM

## 2017-07-03 NOTE — Progress Notes (Signed)
[place] for warfarin  Indication: atrial fibrillation  No Known Allergies  Labs:  Recent Labs  07/01/17 0436 07/02/17 0511 07/02/17 0615 07/02/17 1552 07/03/17 0446  HGB 7.9* 7.7*  --  8.1* 8.2*  HCT 23.8* 24.4*  --  24.9* 25.0*  PLT 193 189  --  205 208  LABPROT 25.5* 34.4*  --   --  30.7*  INR 2.27 3.32  --   --  2.88  CREATININE 2.66*  2.68*  --  2.12* 2.06*  --     Estimated Creatinine Clearance: 40.3 mL/min (A) (by C-G formula based on SCr of 2.06 mg/dL (H)). Pt on warfarin PTA for afib/MVR (bioprosthetic).   INR this am is back in range at 2.88, now s/p RHC indicating RV failure, milrinone stopped. CRRT started 8/14. Hgb up slightly to 8.1, s/p PRBCs 8/14.  Per note from RN yesterday, pt having some bloody bowel movements.  PTA warfarin dose was 2.5mg  daily  Goal of Therapy:  INR 2-3  Monitor platelets by anticoagulation protocol: Yes   Plan:  -Warfarin 1 mg x 1 tonight. -Daily INR for now  Uvaldo Rising, BCPS  Clinical Pharmacist Pager [phone]  07/03/2017 7:16 AM

## 2017-07-04 DIAGNOSIS — Z952 Presence of prosthetic heart valve: Secondary | ICD-10-CM

## 2017-07-04 LAB — GLUCOSE, CAPILLARY
GLUCOSE-CAPILLARY: 99 mg/dL (ref 65–99)
GLUCOSE-CAPILLARY: 65 mg/dL (ref 65–99)
GLUCOSE-CAPILLARY: 241 mg/dL — AB (ref 65–99)
Glucose-Capillary: 228 mg/dL — ABNORMAL HIGH (ref 65–99)
Glucose-Capillary: 101 mg/dL — ABNORMAL HIGH (ref 65–99)

## 2017-07-04 LAB — RENAL FUNCTION PANEL
ALBUMIN: 3.2 g/dL — AB (ref 3.5–5.0)
Albumin: 3.1 g/dL — ABNORMAL LOW (ref 3.5–5.0)
Anion gap: 8 (ref 5–15)
Anion gap: 8 (ref 5–15)
BUN: 31 mg/dL — ABNORMAL HIGH (ref 6–20)
BUN: 33 mg/dL — AB (ref 6–20)
CALCIUM: 8.4 mg/dL — AB (ref 8.9–10.3)
CALCIUM: 8.5 mg/dL — AB (ref 8.9–10.3)
CO2: 26 mmol/L (ref 22–32)
CO2: 25 mmol/L (ref 22–32)
Chloride: 101 mmol/L (ref 101–111)
Chloride: 102 mmol/L (ref 101–111)
Creatinine, Ser: 2 mg/dL — ABNORMAL HIGH (ref 0.61–1.24)
Creatinine, Ser: 1.99 mg/dL — ABNORMAL HIGH (ref 0.61–1.24)
GFR calc Af Amer: 41 mL/min — ABNORMAL LOW (ref >60)
GFR calc Af Amer: 41 mL/min — ABNORMAL LOW (ref >60)
GFR calc non Af Amer: 35 mL/min — ABNORMAL LOW (ref >60)
GFR calc non Af Amer: 36 mL/min — ABNORMAL LOW (ref >60)
GLUCOSE: 150 mg/dL — AB (ref 65–99)
Glucose, Bld: 95 mg/dL (ref 65–99)
PHOSPHORUS: 1.7 mg/dL — AB (ref 2.5–4.6)
PHOSPHORUS: 1.6 mg/dL — AB (ref 2.5–4.6)
Potassium: 4.4 mmol/L (ref 3.5–5.1)
Potassium: 4.8 mmol/L (ref 3.5–5.1)
SODIUM: 135 mmol/L (ref 135–145)
SODIUM: 135 mmol/L (ref 135–145)

## 2017-07-04 LAB — URINE CULTURE

## 2017-07-04 LAB — CBC
HCT: 24.7 % — ABNORMAL LOW (ref 39.0–52.0)
Hemoglobin: 7.8 g/dL — ABNORMAL LOW (ref 13.0–17.0)
MCH: 24 pg — AB (ref 26.0–34.0)
MCHC: 31.6 g/dL (ref 30.0–36.0)
MCV: 76 fL — AB (ref 78.0–100.0)
PLATELETS: 207 10*3/uL (ref 150–400)
RBC: 3.25 MIL/uL — ABNORMAL LOW (ref 4.22–5.81)
RDW: 19.1 % — AB (ref 11.5–15.5)
WBC: 9 10*3/uL (ref 4.0–10.5)

## 2017-07-04 LAB — MAGNESIUM: Magnesium: 2.5 mg/dL — ABNORMAL HIGH (ref 1.7–2.4)

## 2017-07-04 LAB — PROTIME-INR
INR: 2.63
PROTHROMBIN TIME: 28.6 s — AB (ref 11.4–15.2)

## 2017-07-04 MED ORDER — SODIUM CHLORIDE 0.9% FLUSH
3.0000 mL | INTRAVENOUS | Status: DC | PRN
  Administered 2017-07-11: 3 mL via INTRAVENOUS
  Filled 2017-07-04: qty 3

## 2017-07-04 MED ORDER — WARFARIN SODIUM 2.5 MG PO TABS
2.5000 mg | ORAL_TABLET | Freq: Once | ORAL | Status: AC
  Administered 2017-07-04: 2.5 mg via ORAL
  Filled 2017-07-04: qty 1

## 2017-07-04 MED ORDER — DARBEPOETIN ALFA 60 MCG/0.3ML IJ SOSY
60.0000 ug | PREFILLED_SYRINGE | INTRAMUSCULAR | Status: DC
  Administered 2017-07-04: 60 ug via SUBCUTANEOUS
  Filled 2017-07-04 (×2): qty 0.3

## 2017-07-04 MED ORDER — SODIUM CHLORIDE 0.9% FLUSH
3.0000 mL | Freq: Two times a day (BID) | INTRAVENOUS | Status: DC
  Administered 2017-07-04 – 2017-07-27 (×34): 3 mL via INTRAVENOUS

## 2017-07-04 NOTE — Progress Notes (Signed)
[place] for warfarin  Indication: atrial fibrillation  No Known Allergies  Labs:  Recent Labs  07/02/17 0511  07/02/17 1552 07/03/17 0446 07/03/17 1605 07/04/17 0414  HGB 7.7*  --  8.1* 8.2*  --  7.8*  HCT 24.4*  --  24.9* 25.0*  --  24.7*  PLT 189  --  205 208  --  207  LABPROT 34.4*  --   --  30.7*  --  28.6*  INR 3.32  --   --  2.88  --  2.63  CREATININE  --   < > 2.06* 2.04* 2.01*  --   < > = values in this interval not displayed.  Estimated Creatinine Clearance: 39.9 mL/min (A) (by C-G formula based on SCr of 2.01 mg/dL (H)). Pt on warfarin PTA for afib/MVR (bioprosthetic).   INR this am is in range at 2.63, now s/p RHC indicating RV failure, milrinone stopped. CRRT started 8/14. Hgb down slightly to 7.8, s/p PRBCs 8/14.  Per report from RN yesterday, pt having some bloody bowel movements.  PTA warfarin dose was 2.5 mg daily  Goal of Therapy:  INR 2-3  Monitor platelets by anticoagulation protocol: Yes   Plan:  -Warfarin 2.5 mg x 1 tonight. -Daily INR for now  Uvaldo Rising, BCPS  Clinical Pharmacist Pager [phone]  07/04/2017 7:57 AM

## 2017-07-04 NOTE — Progress Notes (Signed)
Patient ID: Michael [name], male   DOB: 12-02-1960, 56 y.o.   MRN: [id number]     Advanced Heart Failure Rounding Note  Primary Cardiologist: Dr. Harrington Challenger HF Cardiology: Luanna Cole  Subjective:    Started CVVHD 8/15 and stopped milrinone.  Continues on CVVHD.  Pulling 250-350 cc an hour. Weight down another 7 lbs - 27 pounds total.   Breathing ok. Eager to come off CVVHD. CVP still 25-26. BP ok. Voracious appetite according to him and nursing team  Hgb 8.2 -> 7.8. No further blood in stools   Studies:  Echo (7/18): EF 50-55%, mild LVH, mild AI, bioprosthetic mitral valve appeared to function normally, RV moderately dilated with moderately decreased systolic function, D-shaped interventricular septum suggestive of RV pressure/volume overload, severe TR, PASP 54 mmHg  RHC Procedural Findings: Hemodynamics (mmHg) RA mean 27 RV 63/8, mean 20 PA 58/28, mean 40 PCWP mean 28 Oxygen saturations: PA 69% AO 96% Cardiac Output (Fick) 9.82  Cardiac Index (Fick) 4.96 PVR 1.2 WU  Objective:   Weight Range: 81.7 kg (180 lb 1.9 oz) Body mass index is 29.97 kg/m.   Vital Signs:   Temp:  [97.1 F (36.2 C)-98.3 F (36.8 C)] 98.3 F (36.8 C) (08/18 0746) Pulse Rate:  [64-97] 71 (08/18 0600) Resp:  [13-22] 15 (08/18 0600) BP: (92-143)/(61-111) 106/61 (08/18 0600) SpO2:  [97 %-100 %] 100 % (08/18 0600) Weight:  [81.7 kg (180 lb 1.9 oz)] 81.7 kg (180 lb 1.9 oz) (08/18 0416) Last BM Date: 07/03/17  Weight change: Filed Weights   07/02/17 0400 07/03/17 0200 07/04/17 0416  Weight: 90.8 kg (200 lb 2.8 oz) 87.7 kg (193 lb 5.5 oz) 81.7 kg (180 lb 1.9 oz)    Intake/Output:   Intake/Output Summary (Last 24 hours) at 07/04/17 0817 Last data filed at 07/04/17 0700  Gross per 24 hour  Intake              660 ml  Output             7668 ml  Net            -7008 ml      Physical Exam    General:  Sitting in chair  No resp difficulty HEENT: normal Neck: supple. RIJ trialysis cath  JVP to ear . Carotids 2+ bilat; no bruits. No lymphadenopathy or thryomegaly appreciated. Cor: PMI nondisplaced. IRR. +RV lift Lungs: clear Abdomen: soft, nontender, ++ distended. No hepatosplenomegaly. No bruits or masses. Good bowel sounds. Extremities: no cyanosis, clubbing, rash, edema Neuro: alert & orientedx3, cranial nerves grossly intact. moves all 4 extremities w/o difficulty. Affect pleasant   Telemetry   Personally reviewed, Afib 80-90s.    Labs    CBC  Recent Labs  07/03/17 0446 07/04/17 0414  WBC 7.0 9.0  HGB 8.2* 7.8*  HCT 25.0* 24.7*  MCV 76.0* 76.0*  PLT 208 505   Basic Metabolic Panel  Recent Labs  07/03/17 0446 07/03/17 1605 07/04/17 0414 07/04/17 0749  NA 134* 133*  --  135  K 4.4 4.5  --  4.4  CL 102 101  --  101  CO2 26 24  --  26  GLUCOSE 121* 205*  --  95  BUN 35* 31*  --  31*  CREATININE 2.04* 2.01*  --  2.00*  CALCIUM 8.5* 8.1*  --  8.4*  MG 2.5*  --  2.5*  --   PHOS 2.2* 2.0*  --  1.7*   Liver Function Tests  Recent Labs  07/03/17 1605 07/04/17 0749  ALBUMIN 3.2* 3.1*   No results for input(s): LIPASE, AMYLASE in the last 72 hours. Cardiac Enzymes No results for input(s): CKTOTAL, CKMB, CKMBINDEX, TROPONINI in the last 72 hours.  BNP: BNP (last 3 results)  Recent Labs  10/05/16 0147 05/30/17 1208 06/25/17 1253  BNP 237.4* 1,404.9* 875.7*    ProBNP (last 3 results)  Recent Labs  03/16/17 1401  PROBNP 6,530*        Imaging   Transthoracic Echocardiography 06/01/17  Study Conclusions  - Left ventricle: The cavity size was normal. There was mild   concentric hypertrophy. Systolic function was normal. The   estimated ejection fraction was in the range of 50% to 55%. Wall   motion was normal; there were no regional wall motion   abnormalities. - Ventricular septum: The contour showed diastolic flattening. - Aortic valve: Transvalvular velocity was minimally increased.   There was no stenosis. There was  mild regurgitation. Peak   velocity (S): 201 cm/s. - Mitral valve: A bioprosthesis was present and functioning   normally. Mean gradient (D): 6 mm Hg. Valve area by pressure   half-time: 1.93 cm^2. - Left atrium: The atrium was severely dilated. - Right ventricle: The cavity size was moderately dilated. Wall   thickness was normal. Systolic function was moderately reduced. - Right atrium: The atrium was moderately dilated. - Tricuspid valve: There was severe regurgitation. - Pulmonary arteries: Systolic pressure was moderately increased.   PA peak pressure: 54 mm Hg (S).   RIGHT HEART CATH 06/29/17 RA mean 27 RV 63/8, mean 20 PA 58/28, mean 40 PCWP mean 28  Oxygen saturations: PA 69% AO 96%  Cardiac Output (Fick) 9.82  Cardiac Index (Fick) 4.96 PVR 1.2 WU     Medications:     Scheduled Medications: . amiodarone  400 mg Oral BID  . atorvastatin  20 mg Oral Daily  . carvedilol  3.125 mg Oral BID WC  . Chlorhexidine Gluconate Cloth  6 each Topical Daily  . ferrous sulfate  325 mg Oral BID WC  . folic acid  1 mg Oral Daily  . insulin aspart  0-15 Units Subcutaneous TID WC  . insulin aspart  0-5 Units Subcutaneous QHS  . insulin detemir  10 Units Subcutaneous Q2200  . pantoprazole  40 mg Oral Daily  . predniSONE  40 mg Oral Q breakfast  . sodium chloride  2 spray Each Nare BID  . sodium chloride flush  10-40 mL Intracatheter Q12H  . warfarin  2.5 mg Oral ONCE-1800  . Warfarin - Pharmacist Dosing Inpatient   Does not apply q1800    Infusions: . sodium chloride Stopped (07/01/17 1500)  . heparin    . dialysis replacement fluid (prismasate) 300 mL/hr at 07/03/17 1138  . dialysis replacement fluid (prismasate) 300 mL/hr at 07/04/17 0612  . 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 Patient underwent mitral valve replacement and tricuspid valve repair in [place] in 2012.    Assessment/Plan   1. Acute on chronic diastolic CHF/RV failure: EF 50-55% on echo  in 7/18 with prominent RV failure (enlarged/hypokinetic RV with D-shaped interventricular septum), severe TR.  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  Milrinone was started at admission for RV support, stopped now that he is getting CVVH. He remains very volume overloaded.  - Continue CVVH, CVP remains quite high.  UF currently at > 250-350 cc/hr. Continue to follow.   - Continue Coreg 3.125 mg BID for now.  2. AKI on CKD stage IV: Now on CVVH and off milrinone.   - Will have to see if kidneys recover once volume status improved.  - Now down 27 pounds but has a way to go. Continue CVVHD - Long-term plan remains quite challenging as I doubt he will tolerate iHD for long with his degree of RV failure. I discussed this with Dr. Rea College yesterday who agrees. He lives alone and doesn;t have much help with his healthcare so I think chance at heart-kidney transplant would be very low. Hopefully renal function will improve.  3. Atrial fibrillation: Persistent.  Has been in atrial fibrillation for a number of months it appears.   - Not sure how well he will stay in NSR given severe TR and RV failure (even with amiodarone), but given the severity of his HF.  - Rate controlled on amio 400 bid - Plan for TEE/DCCV when volume has improved likely later this week. - Continue warfarin.  INR 2.6 4. Anemia:  - Had small  amount of BRBPR over past 2 day, now stopped -Hgb down mildly. Will follow. May need RBCs soon.   - Started on ESA per renal 5. Bioprosthetic mitral valve:  - Stable appearance on 7/18 echo.   6. Probable sleep apnea:  - Will need sleep study outpatient. No change.    Length of Stay: 7  Glori Bickers, MD  07/04/2017, 8:17 AM  Advanced Heart Failure Team Pager [phone] (M-F; 7a - 4p)  Please contact [facility] for night-coverage after hours (4p -7a ) and weekends on [email or web address]

## 2017-07-04 NOTE — Progress Notes (Signed)
Patient ID: Michael [name], male   DOB: 01-Apr-1961, 56 y.o.   MRN: [id number] S: complaining that he cannot sleep due to Catheter in his neck O:BP (!) 135/94 (BP Location: Right Arm)   Pulse 83   Temp 98.3 F (36.8 C) (Oral)   Resp 11   Ht 5\' 5"  (1.651 m)   Wt 81.7 kg (180 lb 1.9 oz)   SpO2 100%   BMI 29.97 kg/m   Intake/Output Summary (Last 24 hours) at 07/04/17 0953 Last data filed at 07/04/17 0900  Gross per 24 hour  Intake              730 ml  Output             7955 ml  Net            -7225 ml   Intake/Output: I/O last 3 completed shifts: In: 37 [P.O.:770; I.V.:10] Out: 11497 [Urine:60; NOMVE:72094]  Intake/Output this shift:  Total I/O In: 200 [P.O.:200] Out: 676 [Other:676] Weight change: -6 kg (-13 lb 3.6 oz) Gen: NAD CVS: no rub Resp: decreased BS at bases [id number] Ext:1 + edema   Recent Labs Lab 06/30/17 1454 07/01/17 0436 07/02/17 0615 07/02/17 1552 07/03/17 0446 07/03/17 1605 07/04/17 0749  NA 132* 134*  134* 135 135 134* 133* 135  K 4.3 4.3  4.2 4.2 4.6 4.4 4.5 4.4  CL 95* 97*  98* 101 100* 102 101 101  CO2 25 27  27 26 28 26 24 26   GLUCOSE 148* 139*  139* 169* 128* 121* 205* 95  BUN 68* 56*  56* 40* 36* 35* 31* 31*  CREATININE 3.39* 2.66*  2.68* 2.12* 2.06* 2.04* 2.01* 2.00*  ALBUMIN 2.9* 3.1* 2.9* 3.0* 3.1* 3.2* 3.1*  CALCIUM 8.4* 8.6*  8.6* 8.3* 8.5* 8.5* 8.1* 8.4*  PHOS 4.7* 4.0 2.4* 2.3* 2.2* 2.0* 1.7*   Liver Function Tests:  Recent Labs Lab 07/03/17 0446 07/03/17 1605 07/04/17 0749  ALBUMIN 3.1* 3.2* 3.1*   No results for input(s): LIPASE, AMYLASE in the last 168 hours. No results for input(s): AMMONIA in the last 168 hours. CBC:  Recent Labs Lab 07/01/17 0436 07/02/17 0511 07/02/17 1552 07/03/17 0446 07/04/17 0414  WBC 4.8 5.6 5.7 7.0 9.0  HGB 7.9* 7.7* 8.1* 8.2* 7.8*  HCT 23.8* 24.4* 24.9* 25.0* 24.7*  MCV 75.1* 77.5* 76.1* 76.0* 76.0*  PLT 193 189 205 208 207   Cardiac Enzymes: No results for  input(s): CKTOTAL, CKMB, CKMBINDEX, TROPONINI in the last 168 hours. CBG:  Recent Labs Lab 07/03/17 1113 07/03/17 1616 07/03/17 2140 07/04/17 0415 07/04/17 0744  GLUCAP 197* 193* 177* 99 65    Iron Studies: No results for input(s): IRON, TIBC, TRANSFERRIN, FERRITIN in the last 72 hours. Studies/Results: No results found. Marland Kitchen amiodarone  400 mg Oral BID  . atorvastatin  20 mg Oral Daily  . carvedilol  3.125 mg Oral BID WC  . Chlorhexidine Gluconate Cloth  6 each Topical Daily  . ferrous sulfate  325 mg Oral BID WC  . folic acid  1 mg Oral Daily  . insulin aspart  0-15 Units Subcutaneous TID WC  . insulin aspart  0-5 Units Subcutaneous QHS  . insulin detemir  10 Units Subcutaneous Q2200  . pantoprazole  40 mg Oral Daily  . predniSONE  40 mg Oral Q breakfast  . sodium chloride  2 spray Each Nare BID  . sodium chloride flush  10-40 mL Intracatheter Q12H  . warfarin  2.5 mg Oral  ONCE-1800  . Warfarin - Pharmacist Dosing Inpatient   Does not apply q1800    BMET    Component Value Date/Time   NA 135 07/04/2017 0749   NA 140 03/30/2017 1406   K 4.4 07/04/2017 0749   CL 101 07/04/2017 0749   CO2 26 07/04/2017 0749   GLUCOSE 95 07/04/2017 0749   BUN 31 (H) 07/04/2017 0749   BUN 33 (H) 03/30/2017 1406   CREATININE 2.00 (H) 07/04/2017 0749   CREATININE 1.61 (H) 01/16/2017 1653   CALCIUM 8.4 (L) 07/04/2017 0749   GFRNONAA 35 (L) 07/04/2017 0749   GFRNONAA 47 (L) 01/16/2017 1653   GFRAA 41 (L) 07/04/2017 0749   GFRAA 54 (L) 01/16/2017 1653   CBC    Component Value Date/Time   WBC 9.0 07/04/2017 0414   RBC 3.25 (L) 07/04/2017 0414   HGB 7.8 (L) 07/04/2017 0414   HCT 24.7 (L) 07/04/2017 0414   PLT 207 07/04/2017 0414   MCV 76.0 (L) 07/04/2017 0414   MCH 24.0 (L) 07/04/2017 0414   MCHC 31.6 07/04/2017 0414   RDW 19.1 (H) 07/04/2017 0414   LYMPHSABS 1.1 02/21/2017 1846   MONOABS 0.7 02/21/2017 1846   EOSABS 0.3 02/21/2017 1846   BASOSABS 0.1 02/21/2017 1846      Assessment/Plan:  1. 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 Failed high dose IV lasix and metolazone with bump in BUN/Cr. 1. Initiated CVVHD with UF on 06/30/17 and has done well with it and is now over 20 liters negative but still remains volume overloaded. 2. Continue with CVVHDF for now as he still remains markedly volume overloaded. 3. Unfortunately his UOP has dropped off significantly. 4. Given his RV failure I don't think he would be able to tolerate IHD well and may need to involve Palliative Care to help set goals/limits of care. 2. 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 Heart failure team has pt on milrinone. He initially was diuresing with lasix 120mg  tid and metolazone but then his UOP markedly dropped off and started on CVVHDF.  1. Negative 13 liters over the last 2 days.  2. CVVHD as above and continue to follow I's/O's, daily weights, and Scr. 3. Atrial fib- persistent 4. Anemia- microcytic. Iron stores acceptable, will need to initiate ESA today. 5. Bioprosthetic MV- stable 6. Disposition- unfortunate 56 yo Asian male with multi-organ failure.  Discussed possible heart-kidney tx with Dr. Haroldine Laws but also recommend palliative care consult to help set goals/limits of care   [name], MD [facility] [phone]

## 2017-07-05 DIAGNOSIS — Z515 Encounter for palliative care: Secondary | ICD-10-CM

## 2017-07-05 LAB — GLUCOSE, CAPILLARY
GLUCOSE-CAPILLARY: 97 mg/dL (ref 65–99)
GLUCOSE-CAPILLARY: 208 mg/dL — AB (ref 65–99)
Glucose-Capillary: 240 mg/dL — ABNORMAL HIGH (ref 65–99)
Glucose-Capillary: 195 mg/dL — ABNORMAL HIGH (ref 65–99)

## 2017-07-05 LAB — CBC
HEMATOCRIT: 24.7 % — AB (ref 39.0–52.0)
HEMOGLOBIN: 7.9 g/dL — AB (ref 13.0–17.0)
MCH: 24.4 pg — ABNORMAL LOW (ref 26.0–34.0)
MCHC: 32 g/dL (ref 30.0–36.0)
MCV: 76.2 fL — ABNORMAL LOW (ref 78.0–100.0)
Platelets: 199 10*3/uL (ref 150–400)
RBC: 3.24 MIL/uL — AB (ref 4.22–5.81)
RDW: 19.1 % — ABNORMAL HIGH (ref 11.5–15.5)
WBC: 10.7 10*3/uL — AB (ref 4.0–10.5)

## 2017-07-05 LAB — RENAL FUNCTION PANEL
ALBUMIN: 3.4 g/dL — AB (ref 3.5–5.0)
ANION GAP: 10 (ref 5–15)
ANION GAP: 5 (ref 5–15)
Albumin: 3.2 g/dL — ABNORMAL LOW (ref 3.5–5.0)
BUN: 35 mg/dL — ABNORMAL HIGH (ref 6–20)
BUN: 35 mg/dL — AB (ref 6–20)
CALCIUM: 8.6 mg/dL — AB (ref 8.9–10.3)
CALCIUM: 8.4 mg/dL — AB (ref 8.9–10.3)
CO2: 25 mmol/L (ref 22–32)
CO2: 25 mmol/L (ref 22–32)
Chloride: 101 mmol/L (ref 101–111)
Chloride: 102 mmol/L (ref 101–111)
Creatinine, Ser: 2.17 mg/dL — ABNORMAL HIGH (ref 0.61–1.24)
Creatinine, Ser: 2.24 mg/dL — ABNORMAL HIGH (ref 0.61–1.24)
GFR calc Af Amer: 36 mL/min — ABNORMAL LOW (ref >60)
GFR calc non Af Amer: 32 mL/min — ABNORMAL LOW (ref >60)
GFR, EST AFRICAN AMERICAN: 37 mL/min — AB (ref >60)
GFR, EST NON AFRICAN AMERICAN: 31 mL/min — AB (ref >60)
GLUCOSE: 196 mg/dL — AB (ref 65–99)
Glucose, Bld: 129 mg/dL — ABNORMAL HIGH (ref 65–99)
PHOSPHORUS: 1.9 mg/dL — AB (ref 2.5–4.6)
PHOSPHORUS: 2 mg/dL — AB (ref 2.5–4.6)
POTASSIUM: 4.4 mmol/L (ref 3.5–5.1)
Potassium: 4.7 mmol/L (ref 3.5–5.1)
SODIUM: 132 mmol/L — AB (ref 135–145)
Sodium: 136 mmol/L (ref 135–145)

## 2017-07-05 LAB — PROTIME-INR
INR: 2.16
Prothrombin Time: 24.5 seconds — ABNORMAL HIGH (ref 11.4–15.2)

## 2017-07-05 LAB — MAGNESIUM: Magnesium: 2.5 mg/dL — ABNORMAL HIGH (ref 1.7–2.4)

## 2017-07-05 MED ORDER — WARFARIN SODIUM 2.5 MG PO TABS
2.5000 mg | ORAL_TABLET | Freq: Once | ORAL | Status: AC
  Administered 2017-07-05: 2.5 mg via ORAL
  Filled 2017-07-05: qty 1

## 2017-07-05 MED ORDER — HYDROXYZINE HCL 10 MG PO TABS
10.0000 mg | ORAL_TABLET | Freq: Four times a day (QID) | ORAL | Status: DC | PRN
  Administered 2017-07-05 – 2017-07-26 (×4): 10 mg via ORAL
  Filled 2017-07-05 (×6): qty 1

## 2017-07-05 MED ORDER — DIPHENHYDRAMINE HCL 50 MG/ML IJ SOLN
25.0000 mg | Freq: Four times a day (QID) | INTRAMUSCULAR | Status: DC | PRN
  Administered 2017-07-05 – 2017-07-19 (×5): 25 mg via INTRAVENOUS
  Filled 2017-07-05 (×4): qty 1

## 2017-07-05 MED ORDER — DIPHENHYDRAMINE HCL 50 MG/ML IJ SOLN
INTRAMUSCULAR | Status: AC
  Administered 2017-07-05: 25 mg via INTRAVENOUS
  Filled 2017-07-05: qty 1

## 2017-07-05 NOTE — Progress Notes (Signed)
Patient ID: Michael [name], male   DOB: 09/02/1961, 56 y.o.   MRN: [id number] S: feels much better but had itching last pm relieved with benadryl O:BP (!) 108/94   Pulse 83   Temp 97.6 F (36.4 C) (Oral)   Resp 12   Ht 5\' 5"  (1.651 m)   Wt 74.6 kg (164 lb 7.4 oz)   SpO2 100%   BMI 27.37 kg/m   Intake/Output Summary (Last 24 hours) at 07/05/17 1049 Last data filed at 07/05/17 1000  Gross per 24 hour  Intake             1600 ml  Output             9207 ml  Net            -7607 ml   Intake/Output: I/O last 3 completed shifts: In: 1390 [P.O.:1390] Out: 12837 [CBJSE:83151]  Intake/Output this shift:  Total I/O In: 410 [P.O.:410] Out: 1307 [Other:1307] Weight change: -7.1 kg (-15 lb 10.4 oz) Gen:WD NAD CVS: no rub Resp:decreased BS at bases Abd:+BS, soft, NT/ND Ext: no peripheral edema, +1 presacral edema   Recent Labs Lab 07/02/17 0615 07/02/17 1552 07/03/17 0446 07/03/17 1605 07/04/17 0749 07/04/17 1542 07/05/17 0339  NA 135 135 134* 133* 135 135 136  K 4.2 4.6 4.4 4.5 4.4 4.8 4.4  CL 101 100* 102 101 101 102 101  CO2 26 28 26 24 26 25 25   GLUCOSE 169* 128* 121* 205* 95 150* 129*  BUN 40* 36* 35* 31* 31* 33* 35*  CREATININE 2.12* 2.06* 2.04* 2.01* 2.00* 1.99* 2.17*  ALBUMIN 2.9* 3.0* 3.1* 3.2* 3.1* 3.2* 3.2*  CALCIUM 8.3* 8.5* 8.5* 8.1* 8.4* 8.5* 8.6*  PHOS 2.4* 2.3* 2.2* 2.0* 1.7* 1.6* 1.9*   Liver Function Tests:  Recent Labs Lab 07/04/17 0749 07/04/17 1542 07/05/17 0339  ALBUMIN 3.1* 3.2* 3.2*   No results for input(s): LIPASE, AMYLASE in the last 168 hours. No results for input(s): AMMONIA in the last 168 hours. CBC:  Recent Labs Lab 07/02/17 0511 07/02/17 1552 07/03/17 0446 07/04/17 0414 07/05/17 0339  WBC 5.6 5.7 7.0 9.0 10.7*  HGB 7.7* 8.1* 8.2* 7.8* 7.9*  HCT 24.4* 24.9* 25.0* 24.7* 24.7*  MCV 77.5* 76.1* 76.0* 76.0* 76.2*  PLT 189 205 208 207 199   Cardiac Enzymes: No results for input(s): CKTOTAL, CKMB, CKMBINDEX, TROPONINI in  the last 168 hours. CBG:  Recent Labs Lab 07/04/17 0744 07/04/17 1120 07/04/17 1632 07/04/17 2122 07/05/17 0727  GLUCAP 65 228* 241* 101* 97    Iron Studies: No results for input(s): IRON, TIBC, TRANSFERRIN, FERRITIN in the last 72 hours. Studies/Results: No results found. Marland Kitchen amiodarone  400 mg Oral BID  . atorvastatin  20 mg Oral Daily  . carvedilol  3.125 mg Oral BID WC  . Chlorhexidine Gluconate Cloth  6 each Topical Daily  . darbepoetin (ARANESP) injection - NON-DIALYSIS  60 mcg Subcutaneous Q Sat-1800  . ferrous sulfate  325 mg Oral BID WC  . folic acid  1 mg Oral Daily  . insulin aspart  0-15 Units Subcutaneous TID WC  . insulin aspart  0-5 Units Subcutaneous QHS  . insulin detemir  10 Units Subcutaneous Q2200  . pantoprazole  40 mg Oral Daily  . predniSONE  40 mg Oral Q breakfast  . sodium chloride  2 spray Each Nare BID  . sodium chloride flush  10-40 mL Intracatheter Q12H  . sodium chloride flush  3 mL Intravenous Q12H  .  warfarin  2.5 mg Oral ONCE-1800  . Warfarin - Pharmacist Dosing Inpatient   Does not apply q1800    BMET    Component Value Date/Time   NA 136 07/05/2017 0339   NA 140 03/30/2017 1406   K 4.4 07/05/2017 0339   CL 101 07/05/2017 0339   CO2 25 07/05/2017 0339   GLUCOSE 129 (H) 07/05/2017 0339   BUN 35 (H) 07/05/2017 0339   BUN 33 (H) 03/30/2017 1406   CREATININE 2.17 (H) 07/05/2017 0339   CREATININE 1.61 (H) 01/16/2017 1653   CALCIUM 8.6 (L) 07/05/2017 0339   GFRNONAA 32 (L) 07/05/2017 0339   GFRNONAA 47 (L) 01/16/2017 1653   GFRAA 37 (L) 07/05/2017 0339   GFRAA 54 (L) 01/16/2017 1653   CBC    Component Value Date/Time   WBC 10.7 (H) 07/05/2017 0339   RBC 3.24 (L) 07/05/2017 0339   HGB 7.9 (L) 07/05/2017 0339   HCT 24.7 (L) 07/05/2017 0339   PLT 199 07/05/2017 0339   MCV 76.2 (L) 07/05/2017 0339   MCH 24.4 (L) 07/05/2017 0339   MCHC 32.0 07/05/2017 0339   RDW 19.1 (H) 07/05/2017 0339   LYMPHSABS 1.1 02/21/2017 1846   MONOABS  0.7 02/21/2017 1846   EOSABS 0.3 02/21/2017 1846   BASOSABS 0.1 02/21/2017 1846   Assessment/Plan:  1. 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 Failed high dose IV lasix and metolazone with bump in BUN/Cr. 1. Initiated CVVHD with UF on 06/30/17 and has done well with it and is now over 27 liters negative but still remains volume overloaded with cvp of 23 (goal 12-15). 2. Continue with CVVHDF for another 24 hours as he still remains markedly volume overloaded but can hopefully transition to IHD if his renal function does not recover. 3. Unfortunately his UOP has dropped off significantly. 4. Given his RV failure I don't think he would be able to tolerate IHD well and may need to involve Palliative Care to help set goals/limits of care. 2. Acute on chronic CHF with RV failure- s/p right heart cath with worsening RV failure and pulmonary HTN as well as elevated LVEDFP. Heart failure team has pt on milrinone. He initially was diuresing with lasix 120mg  tid and metolazone but then his UOP markedly dropped off and started on CVVHDF.  1. Negative 14 liters over the last 2 days.  2. CVVHD as above and continue to follow I's/O's, daily weights, and Scr. 3. Atrial fib- persistent 4. Anemia- microcytic. Iron stores acceptable, will need to initiate ESA today. 5. Bioprosthetic MV- stable 6. Leukocytosis- afebrile, need to watch for signs of infection, HD cath only 63 days old, no evidence of drainage. 7. Disposition- unfortunate 56 yo Asian male with multi-organ failure. Discussed possible heart-kidney tx with Dr. Haroldine Laws but also recommend palliative care consult to help set goals/limits of care [name], MD [facility] [phone]

## 2017-07-05 NOTE — Progress Notes (Signed)
Due to a high number of consults this weekend, there will be a delay that a Palliative Provider will be available to see your pt. We hope to see your pt by 07/07/17. Thank you for the consult and your patience. Romona Curls, ANP

## 2017-07-05 NOTE — Progress Notes (Signed)
Patient ID: Michael [name], male   DOB: 05/13/1961, 56 y.o.   MRN: [id number]     Advanced Heart Failure Rounding Note  Primary Cardiologist: Dr. Harrington Challenger HF Cardiology: Luanna Cole  Subjective:    Started CVVHD 8/15 and stopped milrinone.  Continues on CVVHD.  Pulling 300-350 cc an hour. Weight down another 16 lbs - 43 pounds total.   Feel very good. No orthopnea or PND.  Eager to come off CVVHD. CVP now 23. BP ok. Continues with voracious appetite according to him and nursing team  Hgb 8.2 -> 7.8 -> 7.9. No further blood in stools   Studies:  Echo (7/18): EF 50-55%, mild LVH, mild AI, bioprosthetic mitral valve appeared to function normally, RV moderately dilated with moderately decreased systolic function, D-shaped interventricular septum suggestive of RV pressure/volume overload, severe TR, PASP 54 mmHg  RHC Procedural Findings: Hemodynamics (mmHg) RA mean 27 RV 63/8, mean 20 PA 58/28, mean 40 PCWP mean 28 Oxygen saturations: PA 69% AO 96% Cardiac Output (Fick) 9.82  Cardiac Index (Fick) 4.96 PVR 1.2 WU  Objective:   Weight Range: 74.6 kg (164 lb 7.4 oz) Body mass index is 27.37 kg/m.   Vital Signs:   Temp:  [97.5 F (36.4 C)-98.3 F (36.8 C)] 97.6 F (36.4 C) (08/19 0729) Pulse Rate:  [76-130] 103 (08/19 0800) Resp:  [13-22] 19 (08/19 0800) BP: (103-125)/(38-111) 124/96 (08/19 0800) SpO2:  [95 %-100 %] 100 % (08/19 0800) Weight:  [74.6 kg (164 lb 7.4 oz)] 74.6 kg (164 lb 7.4 oz) (08/19 0600) Last BM Date: 07/04/17  Weight change: Filed Weights   07/03/17 0200 07/04/17 0416 07/05/17 0600  Weight: 87.7 kg (193 lb 5.5 oz) 81.7 kg (180 lb 1.9 oz) 74.6 kg (164 lb 7.4 oz)    Intake/Output:   Intake/Output Summary (Last 24 hours) at 07/05/17 0919 Last data filed at 07/05/17 0900  Gross per 24 hour  Intake             1240 ml  Output             9042 ml  Net            -7802 ml      Physical Exam    General:  Sitting in chair eating breakfast. No  resp difficulty HEENT: normal Neck: supple. RIJ trialysis JVP to ear Carotids 2+ bilat; no bruits. No lymphadenopathy or thryomegaly appreciated. Cor: PMI nondisplaced. IRR 2/6 TR + RV lift Lungs: clear Abdomen: soft, nontender, nondistended. No hepatosplenomegaly. No bruits or masses. Good bowel sounds. Extremities: no cyanosis, clubbing, rash. trace edema Neuro: alert & orientedx3, cranial nerves grossly intact. moves all 4 extremities w/o difficulty. Affect pleasant    Telemetry   Personally reviewed, Afib 90s.    Labs    CBC  Recent Labs  07/04/17 0414 07/05/17 0339  WBC 9.0 10.7*  HGB 7.8* 7.9*  HCT 24.7* 24.7*  MCV 76.0* 76.2*  PLT 207 253   Basic Metabolic Panel  Recent Labs  07/04/17 0414  07/04/17 1542 07/05/17 0339  NA  --   < > 135 136  K  --   < > 4.8 4.4  CL  --   < > 102 101  CO2  --   < > 25 25  GLUCOSE  --   < > 150* 129*  BUN  --   < > 33* 35*  CREATININE  --   < > 1.99* 2.17*  CALCIUM  --   < >  8.5* 8.6*  MG 2.5*  --   --  2.5*  PHOS  --   < > 1.6* 1.9*  < > = values in this interval not displayed. Liver Function Tests  Recent Labs  07/04/17 1542 07/05/17 0339  ALBUMIN 3.2* 3.2*   No results for input(s): LIPASE, AMYLASE in the last 72 hours. Cardiac Enzymes No results for input(s): CKTOTAL, CKMB, CKMBINDEX, TROPONINI in the last 72 hours.  BNP: BNP (last 3 results)  Recent Labs  10/05/16 0147 05/30/17 1208 06/25/17 1253  BNP 237.4* 1,404.9* 875.7*    ProBNP (last 3 results)  Recent Labs  03/16/17 1401  PROBNP 6,530*        Imaging   Transthoracic Echocardiography 06/01/17  Study Conclusions  - Left ventricle: The cavity size was normal. There was mild   concentric hypertrophy. Systolic function was normal. The   estimated ejection fraction was in the range of 50% to 55%. Wall   motion was normal; there were no regional wall motion   abnormalities. - Ventricular septum: The contour showed diastolic  flattening. - Aortic valve: Transvalvular velocity was minimally increased.   There was no stenosis. There was mild regurgitation. Peak   velocity (S): 201 cm/s. - Mitral valve: A bioprosthesis was present and functioning   normally. Mean gradient (D): 6 mm Hg. Valve area by pressure   half-time: 1.93 cm^2. - Left atrium: The atrium was severely dilated. - Right ventricle: The cavity size was moderately dilated. Wall   thickness was normal. Systolic function was moderately reduced. - Right atrium: The atrium was moderately dilated. - Tricuspid valve: There was severe regurgitation. - Pulmonary arteries: Systolic pressure was moderately increased.   PA peak pressure: 54 mm Hg (S).   RIGHT HEART CATH 06/29/17 RA mean 27 RV 63/8, mean 20 PA 58/28, mean 40 PCWP mean 28  Oxygen saturations: PA 69% AO 96%  Cardiac Output (Fick) 9.82  Cardiac Index (Fick) 4.96 PVR 1.2 WU     Medications:     Scheduled Medications: . amiodarone  400 mg Oral BID  . atorvastatin  20 mg Oral Daily  . carvedilol  3.125 mg Oral BID WC  . Chlorhexidine Gluconate Cloth  6 each Topical Daily  . darbepoetin (ARANESP) injection - NON-DIALYSIS  60 mcg Subcutaneous Q Sat-1800  . ferrous sulfate  325 mg Oral BID WC  . folic acid  1 mg Oral Daily  . insulin aspart  0-15 Units Subcutaneous TID WC  . insulin aspart  0-5 Units Subcutaneous QHS  . insulin detemir  10 Units Subcutaneous Q2200  . pantoprazole  40 mg Oral Daily  . predniSONE  40 mg Oral Q breakfast  . sodium chloride  2 spray Each Nare BID  . sodium chloride flush  10-40 mL Intracatheter Q12H  . sodium chloride flush  3 mL Intravenous Q12H  . warfarin  2.5 mg Oral ONCE-1800  . Warfarin - Pharmacist Dosing Inpatient   Does not apply q1800    Infusions: . sodium chloride Stopped (07/01/17 1500)  . heparin    . dialysis replacement fluid (prismasate) 300 mL/hr at 07/04/17 2253  . dialysis replacement fluid (prismasate) 300 mL/hr at  07/04/17 1750  . 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 Patient underwent mitral valve replacement and tricuspid valve repair in [place] in 2012.    Assessment/Plan   1. Acute on chronic diastolic CHF/RV failure: EF 50-55% on echo in 7/18 with prominent RV failure (enlarged/hypokinetic RV with D-shaped interventricular septum), severe TR.  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  Milrinone was started at admission for RV support, stopped now that he is getting CVVH. He remains very volume overloaded.  - CVP remains coming down but still markedly elevated at 23. Would like to get him closer to 15. Continue CVVH for 1-2 more days. Avoid over pulling as he is preload dependent with RV failure. D/W Dr. Tressie Ellis - Continue Coreg 3.125 mg BID for now.  2. AKI on CKD stage IV: Now on CVVH and off milrinone.   - Will have to see if kidneys recover once volume status improved.  - Now down 43  pounds but CVP still up. Continue CVVHD. Plan as above - Long-term plan remains quite challenging as I doubt he will tolerate iHD for long with his degree of RV failure. I discussed this with Dr. Christain [place] agrees. He lives alone and doesn;t have much help with his healthcare so I think chance at heart-kidney transplant would be very low. Hopefully renal function will  improve. If not, comfort care may be only option. Palliative Care has seen. 3. Atrial fibrillation: Persistent.  Has been in atrial fibrillation for a number of months it appears.   - Not sure how well he will stay in NSR given severe TR and RV failure (even with amiodarone), but given the severity of his HF.  - Rate controlled on amio 400 bid - Plan for TEE/DCCV when volume has improved likely later this week. - Continue warfarin.  INR 2.16. D/w pharmD.  4. Anemia:  - Had small amount of BRBPR earlier in the week, now stopped - Hgb stable. Will follow. Transfuse as needed   - Started on ESA per renal 5. Bioprosthetic mitral valve:  - Stable appearance on 7/18 echo.   6. Probable sleep apnea:  - Will need sleep study outpatient. No change.    Length of Stay: 8  Glori Bickers, MD  07/05/2017, 9:19 AM  Advanced Heart Failure Team Pager [phone] (M-F; 7a - 4p)  Please contact [facility] for night-coverage after hours (4p -7a ) and weekends on [email or web address]

## 2017-07-05 NOTE — Progress Notes (Signed)
Pt with new c/o generalized itching, inspection of skin revealed no new rashes or whelps. Pt given PRN Atarax PO give per order. After approx. 1 1/2 hrs, no relief. Dr Marval Regal paged, explained non-relief from Atarax. Order received for PRN Benadryl and increase frequency of Atarax. Benadryl given as ordered, will continue to monitor and assess.

## 2017-07-05 NOTE — Progress Notes (Addendum)
[place] for warfarin  Indication: atrial fibrillation  No Known Allergies  Labs:  Recent Labs  07/03/17 0446  07/04/17 0414 07/04/17 0749 07/04/17 1542 07/05/17 0339  HGB 8.2*  --  7.8*  --   --  7.9*  HCT 25.0*  --  24.7*  --   --  24.7*  PLT 208  --  207  --   --  199  LABPROT 30.7*  --  28.6*  --   --  24.5*  INR 2.88  --  2.63  --   --  2.16  CREATININE 2.04*  < >  --  2.00* 1.99* 2.17*  < > = values in this interval not displayed.  Estimated Creatinine Clearance: 35.4 mL/min (A) (by C-G formula based on SCr of 2.17 mg/dL (H)). Pt on warfarin PTA for afib/MVR (bioprosthetic).   INR this am is in range at 2.63, now s/p RHC indicating RV failure, milrinone stopped. CRRT started 8/14. Hgb down slightly to 7.8, s/p PRBCs 8/14.  Per report from RN Friday, pt having some bloody bowel movements, today's nurse reports only some red streaking.  PTA warfarin dose was 2.5 mg daily  Goal of Therapy:  INR 2-3  Monitor platelets by anticoagulation protocol: Yes   Plan:  -Warfarin 2.5 mg x 1 tonight. -Daily INR for now  Uvaldo Rising, BCPS  Clinical Pharmacist Pager [phone]  07/05/2017 7:57 AM

## 2017-07-05 NOTE — Plan of Care (Signed)
Problem: Activity: Goal: Capacity to carry out activities will improve Outcome: Progressing Pt tolerating getting OOB to chair with assistance to manage CRRT, staying up 2-3 hours. No s/s of respiratory or hemodynamic instability. Sleep encouraged at night , however Pt c/o insomnia. Medication provided without relief.  Problem: Education: Goal: Ability to demonstrate managment of disease process will improve Outcome: Progressing Discussion had regarding CRRT, duration and goals. Questions answered, comfort provided.

## 2017-07-05 NOTE — Plan of Care (Signed)
Problem: Activity: Goal: Ability to return to baseline activity level will improve Outcome: Progressing Activity limited due to CRRT

## 2017-07-06 ENCOUNTER — Ambulatory Visit: Payer: Self-pay

## 2017-07-06 DIAGNOSIS — Z7189 Other specified counseling: Secondary | ICD-10-CM

## 2017-07-06 LAB — RENAL FUNCTION PANEL
ALBUMIN: 3.4 g/dL — AB (ref 3.5–5.0)
ANION GAP: 9 (ref 5–15)
Albumin: 3.4 g/dL — ABNORMAL LOW (ref 3.5–5.0)
Anion gap: 9 (ref 5–15)
BUN: 35 mg/dL — AB (ref 6–20)
BUN: 35 mg/dL — ABNORMAL HIGH (ref 6–20)
CHLORIDE: 101 mmol/L (ref 101–111)
CO2: 27 mmol/L (ref 22–32)
CO2: 25 mmol/L (ref 22–32)
CREATININE: 2.22 mg/dL — AB (ref 0.61–1.24)
Calcium: 8.6 mg/dL — ABNORMAL LOW (ref 8.9–10.3)
Calcium: 8.7 mg/dL — ABNORMAL LOW (ref 8.9–10.3)
Chloride: 101 mmol/L (ref 101–111)
Creatinine, Ser: 2.3 mg/dL — ABNORMAL HIGH (ref 0.61–1.24)
GFR calc Af Amer: 36 mL/min — ABNORMAL LOW (ref >60)
GFR calc Af Amer: 35 mL/min — ABNORMAL LOW (ref >60)
GFR calc non Af Amer: 31 mL/min — ABNORMAL LOW (ref >60)
GFR calc non Af Amer: 30 mL/min — ABNORMAL LOW (ref >60)
GLUCOSE: 139 mg/dL — AB (ref 65–99)
Glucose, Bld: 132 mg/dL — ABNORMAL HIGH (ref 65–99)
PHOSPHORUS: 3.8 mg/dL (ref 2.5–4.6)
POTASSIUM: 4.3 mmol/L (ref 3.5–5.1)
POTASSIUM: 4.6 mmol/L (ref 3.5–5.1)
Phosphorus: 1.6 mg/dL — ABNORMAL LOW (ref 2.5–4.6)
Sodium: 137 mmol/L (ref 135–145)
Sodium: 135 mmol/L (ref 135–145)

## 2017-07-06 LAB — GLUCOSE, CAPILLARY
GLUCOSE-CAPILLARY: 376 mg/dL — AB (ref 65–99)
GLUCOSE-CAPILLARY: 209 mg/dL — AB (ref 65–99)
Glucose-Capillary: 176 mg/dL — ABNORMAL HIGH (ref 65–99)
Glucose-Capillary: 109 mg/dL — ABNORMAL HIGH (ref 65–99)

## 2017-07-06 LAB — CBC
HCT: 26.7 % — ABNORMAL LOW (ref 39.0–52.0)
Hemoglobin: 8.3 g/dL — ABNORMAL LOW (ref 13.0–17.0)
MCH: 23.9 pg — AB (ref 26.0–34.0)
MCHC: 31.1 g/dL (ref 30.0–36.0)
MCV: 76.7 fL — ABNORMAL LOW (ref 78.0–100.0)
PLATELETS: 224 10*3/uL (ref 150–400)
RBC: 3.48 MIL/uL — ABNORMAL LOW (ref 4.22–5.81)
RDW: 19.3 % — ABNORMAL HIGH (ref 11.5–15.5)
WBC: 13.4 10*3/uL — ABNORMAL HIGH (ref 4.0–10.5)

## 2017-07-06 LAB — PROTIME-INR
INR: 2.39
PROTHROMBIN TIME: 26.5 s — AB (ref 11.4–15.2)

## 2017-07-06 LAB — MAGNESIUM: MAGNESIUM: 2.8 mg/dL — AB (ref 1.7–2.4)

## 2017-07-06 MED ORDER — WARFARIN SODIUM 1 MG PO TABS
1.0000 mg | ORAL_TABLET | Freq: Once | ORAL | Status: AC
  Administered 2017-07-06: 1 mg via ORAL
  Filled 2017-07-06: qty 1

## 2017-07-06 MED ORDER — PREDNISONE 10 MG PO TABS
5.0000 mg | ORAL_TABLET | Freq: Once | ORAL | Status: AC
  Administered 2017-07-10: 5 mg via ORAL
  Filled 2017-07-06: qty 1

## 2017-07-06 MED ORDER — SODIUM CHLORIDE 0.9 % IV SOLN: 250.0000 mL | INTRAVENOUS | Status: DC

## 2017-07-06 MED ORDER — SODIUM PHOSPHATES 45 MMOLE/15ML IV SOLN
30.0000 mmol | Freq: Once | INTRAVENOUS | Status: AC
  Administered 2017-07-06: 30 mmol via INTRAVENOUS
  Filled 2017-07-06: qty 10

## 2017-07-06 MED ORDER — INSULIN ASPART 100 UNIT/ML ~~LOC~~ SOLN
4.0000 [IU] | Freq: Three times a day (TID) | SUBCUTANEOUS | Status: DC
  Administered 2017-07-06 – 2017-07-18 (×17): 4 [IU] via SUBCUTANEOUS

## 2017-07-06 MED ORDER — SODIUM CHLORIDE 0.9 % IV SOLN
510.0000 mg | Freq: Once | INTRAVENOUS | Status: AC
  Administered 2017-07-06: 510 mg via INTRAVENOUS
  Filled 2017-07-06: qty 17

## 2017-07-06 MED ORDER — SODIUM CHLORIDE 0.9% FLUSH
3.0000 mL | Freq: Two times a day (BID) | INTRAVENOUS | Status: DC
  Administered 2017-07-06: 3 mL via INTRAVENOUS

## 2017-07-06 MED ORDER — PREDNISONE 20 MG PO TABS
30.0000 mg | ORAL_TABLET | Freq: Once | ORAL | Status: AC
  Administered 2017-07-07: 30 mg via ORAL
  Filled 2017-07-06: qty 1

## 2017-07-06 MED ORDER — SODIUM CHLORIDE 0.9% FLUSH: 3.0000 mL | INTRAVENOUS | Status: DC | PRN

## 2017-07-06 MED ORDER — SODIUM CHLORIDE 0.9 % IV SOLN
INTRAVENOUS | Status: DC | PRN
  Administered 2017-07-06: 500 mL via INTRAVENOUS

## 2017-07-06 MED ORDER — PREDNISONE 10 MG PO TABS
10.0000 mg | ORAL_TABLET | Freq: Once | ORAL | Status: AC
  Administered 2017-07-09: 10 mg via ORAL
  Filled 2017-07-06: qty 1

## 2017-07-06 MED ORDER — PREDNISONE 20 MG PO TABS
20.0000 mg | ORAL_TABLET | Freq: Once | ORAL | Status: AC
  Administered 2017-07-08: 20 mg via ORAL
  Filled 2017-07-06: qty 1

## 2017-07-06 MED ORDER — FUROSEMIDE 80 MG PO TABS
160.0000 mg | ORAL_TABLET | Freq: Three times a day (TID) | ORAL | Status: DC
  Administered 2017-07-06 – 2017-07-09 (×12): 160 mg via ORAL
  Filled 2017-07-06 (×12): qty 2

## 2017-07-06 MED ORDER — DARBEPOETIN ALFA 200 MCG/0.4ML IJ SOSY
200.0000 ug | PREFILLED_SYRINGE | INTRAMUSCULAR | Status: DC
  Administered 2017-07-11 – 2017-07-25 (×3): 200 ug via SUBCUTANEOUS
  Filled 2017-07-06 (×3): qty 0.4

## 2017-07-06 NOTE — Progress Notes (Signed)
Patient ID: Michael [name], male   DOB: 02-Sep-1961, 56 y.o.   MRN: [id number]     Advanced Heart Failure Rounding Note  Primary Cardiologist: Dr. Harrington Challenger HF Cardiology: Luanna Cole  Subjective:    Started CVVHD 8/15 and stopped milrinone.  Continues on CVVHD. Negative 6.6 L yesterday and weight down another 12 lbs (55 lbs total). Pulling 300-350 cc an hour. CVP 17-18 on my personal check.   Feeling good this morning. Denies SOB. Appetite is great. Denies lightheadedness or dizziness.   Hgb stable at 8.3 this am.   Studies:  Echo (7/18): EF 50-55%, mild LVH, mild AI, bioprosthetic mitral valve appeared to function normally, RV moderately dilated with moderately decreased systolic function, D-shaped interventricular septum suggestive of RV pressure/volume overload, severe TR, PASP 54 mmHg  RHC Procedural Findings: Hemodynamics (mmHg) RA mean 27 RV 63/8, mean 20 PA 58/28, mean 40 PCWP mean 28 Oxygen saturations: PA 69% AO 96% Cardiac Output (Fick) 9.82  Cardiac Index (Fick) 4.96 PVR 1.2 WU  Objective:   Weight Range: 152 lb 5.4 oz (69.1 kg) Body mass index is 25.35 kg/m.   Vital Signs:   Temp:  [97.6 F (36.4 C)-98.7 F (37.1 C)] 98.5 F (36.9 C) (08/19 2300) Pulse Rate:  [63-136] 108 (08/20 0700) Resp:  [12-21] 15 (08/20 0700) BP: (96-136)/(49-96) 105/69 (08/20 0700) SpO2:  [88 %-100 %] 100 % (08/20 0700) Weight:  [152 lb 5.4 oz (69.1 kg)] 152 lb 5.4 oz (69.1 kg) (08/20 0600) Last BM Date: 07/05/17  Weight change: Filed Weights   07/04/17 0416 07/05/17 0600 07/06/17 0600  Weight: 180 lb 1.9 oz (81.7 kg) 164 lb 7.4 oz (74.6 kg) 152 lb 5.4 oz (69.1 kg)    Intake/Output:   Intake/Output Summary (Last 24 hours) at 07/06/17 0722 Last data filed at 07/06/17 0700  Gross per 24 hour  Intake             1890 ml  Output             8555 ml  Net            -6665 ml      Physical Exam    CVP 17-18 General: Seated. NAD.  HEENT: Normal Neck: Supple. RIJ  trialysis. JVP to ear. Carotids 2+ bilat; no bruits. No thyromegaly or nodule noted. Cor: PMI nondisplaced. IRR 2/6 TR + RV lift.  Lungs: CTAB, normal effort. Abdomen: Soft, non-tender, non-distended, no HSM. No bruits or masses. +BS  Extremities: No cyanosis, clubbing, or rash. Trace ankle edema.  Neuro: Alert & orientedx3, cranial nerves grossly intact. moves all 4 extremities w/o difficulty. Affect pleasant   Telemetry   Personally reviewed, Afib 90s   Labs    CBC  Recent Labs  07/05/17 0339 07/06/17 0336  WBC 10.7* 13.4*  HGB 7.9* 8.3*  HCT 24.7* 26.7*  MCV 76.2* 76.7*  PLT 199 409   Basic Metabolic Panel  Recent Labs  07/05/17 0339 07/05/17 1634 07/06/17 0336  NA 136 132* 137  K 4.4 4.7 4.3  CL 101 102 101  CO2 25 25 27   GLUCOSE 129* 196* 132*  BUN 35* 35* 35*  CREATININE 2.17* 2.24* 2.22*  CALCIUM 8.6* 8.4* 8.6*  MG 2.5*  --  2.8*  PHOS 1.9* 2.0* 1.6*   Liver Function Tests  Recent Labs  07/05/17 1634 07/06/17 0336  ALBUMIN 3.4* 3.4*   No results for input(s): LIPASE, AMYLASE in the last 72 hours. Cardiac Enzymes No results for input(s): CKTOTAL,  CKMB, CKMBINDEX, TROPONINI in the last 72 hours.  BNP: BNP (last 3 results)  Recent Labs  10/05/16 0147 05/30/17 1208 06/25/17 1253  BNP 237.4* 1,404.9* 875.7*    ProBNP (last 3 results)  Recent Labs  03/16/17 1401  PROBNP 6,530*        Imaging   Transthoracic Echocardiography 06/01/17  Study Conclusions  - Left ventricle: The cavity size was normal. There was mild   concentric hypertrophy. Systolic function was normal. The   estimated ejection fraction was in the range of 50% to 55%. Wall   motion was normal; there were no regional wall motion   abnormalities. - Ventricular septum: The contour showed diastolic flattening. - Aortic valve: Transvalvular velocity was minimally increased.   There was no stenosis. There was mild regurgitation. Peak   velocity (S): 201 cm/s. -  Mitral valve: A bioprosthesis was present and functioning   normally. Mean gradient (D): 6 mm Hg. Valve area by pressure   half-time: 1.93 cm^2. - Left atrium: The atrium was severely dilated. - Right ventricle: The cavity size was moderately dilated. Wall   thickness was normal. Systolic function was moderately reduced. - Right atrium: The atrium was moderately dilated. - Tricuspid valve: There was severe regurgitation. - Pulmonary arteries: Systolic pressure was moderately increased.   PA peak pressure: 54 mm Hg (S).   RIGHT HEART CATH 06/29/17 RA mean 27 RV 63/8, mean 20 PA 58/28, mean 40 PCWP mean 28  Oxygen saturations: PA 69% AO 96%  Cardiac Output (Fick) 9.82  Cardiac Index (Fick) 4.96 PVR 1.2 WU     Medications:     Scheduled Medications: . amiodarone  400 mg Oral BID  . atorvastatin  20 mg Oral Daily  . carvedilol  3.125 mg Oral BID WC  . Chlorhexidine Gluconate Cloth  6 each Topical Daily  . [START ON 07/11/2017] darbepoetin (ARANESP) injection - NON-DIALYSIS  200 mcg Subcutaneous Q Sat-1800  . folic acid  1 mg Oral Daily  . furosemide  160 mg Oral TID  . insulin aspart  0-15 Units Subcutaneous TID WC  . insulin aspart  0-5 Units Subcutaneous QHS  . insulin detemir  10 Units Subcutaneous Q2200  . pantoprazole  40 mg Oral Daily  . predniSONE  40 mg Oral Q breakfast  . sodium chloride  2 spray Each Nare BID  . sodium chloride flush  10-40 mL Intracatheter Q12H  . sodium chloride flush  3 mL Intravenous Q12H  . Warfarin - Pharmacist Dosing Inpatient   Does not apply q1800    Infusions: . sodium chloride Stopped (07/01/17 1500)  . ferumoxytol    . heparin    . dialysis replacement fluid (prismasate) 300 mL/hr at 07/05/17 1511  . dialysis replacement fluid (prismasate) 300 mL/hr at 07/04/17 1750  . dialysate (PRISMASATE) 1,000 mL/hr at 07/06/17 0450  . 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 Patient underwent mitral valve replacement and tricuspid valve repair in [place] in 2012.    Assessment/Plan   1. Acute on chronic diastolic CHF/RV failure:  EF 50-55% on echo in 7/18 with prominent RV failure (enlarged/hypokinetic RV with D-shaped interventricular septum), severe TR.  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  Milrinone was started at admission for RV support, stopped now that he is getting CVVH. He remains very volume overloaded.  - CVP remains elevated but improving. Goal ~ 15. Currently ~ 18. Would continue CVVH today, and plan on stopping either late this evening or tomorrow am.  - Avoid over pulling as he is preload dependent with RV failure.  - Continue Coreg 3.125 mg BID for now.  2. AKI on CKD stage IV: Now on CVVH and off milrinone.   - Will have to see if kidneys recover once volume status improved.  - Now down 54 lbs, but CVP remains elevated. Continue CVVHD. Plan as above - Long-term plan remains quite challenging. Doubt that he will tolerate iHD for long with his degree of RV failure. Have discussed this with Dr. Marval Regal who agrees. He lives alone and doesn't have much help with his healthcare so chance at heart-kidney transplant would be very low. Hopefully renal function will improve. If not, comfort care may be only option. Palliative Care has seen. 3. Atrial fibrillation: Persistent.  Has been in  atrial fibrillation for a number of months it appears.   - Not sure how well he will stay in NSR given severe TR and RV failure (even with amiodarone), but given the severity of his HF.  - Rate controlled on amio 400 bid - Plan for TEE/DCCV when volume has improved, likely later this week.  - Continue warfarin.  IRN 2.39 this am. Dosing per Pharm D.  4. Anemia:  - Had small amount of BRBPR last week. Now stopped.  - Hgb stable. Continue to follow. Transfuse prn.  - Started on ESA per renal 5. Bioprosthetic mitral valve:  - Stable appearance on 7/18 echo.  No change.  6. Probable sleep apnea:  - Will need sleep study outpatient. No change.     Length of Stay: [place], [place]  07/06/2017, 7:22 AM  Advanced Heart Failure Team Pager [phone] (M-F; 7a - 4p)  Please contact [facility] for night-coverage after hours (4p -7a ) and weekends on [email or web address]  Patient seen with PA, agree with the above note.  CVP remains about 17-18.  Continue CVVH today.  Starting Lasix 160 mg po tid to try to stimulate UOP.   Will see if he can stay off HD, but suspect he could tolerate it if necessary (has done well with CVVH).   Once volume is optimized, plan TEE-guided DCCV.   I suspect heart/kidney transplant will be a stretch, limited social situation.  [name] 07/06/2017 1:05 PM

## 2017-07-06 NOTE — Progress Notes (Signed)
Physical Therapy Treatment Patient Details Name: Giovoni [name] MRN: [id number] DOB: 06-28-1961 Today's Date: 07/06/2017    History of Present Illness HPI: Michael [name] is a 56 y.o. male with medical history significant of anemia of chronic disease, chronic kidney disease, systolic heart failure, diabetes mellitus, GERD, history of tricuspid valve repair, essential hypertension, atrial fibrillation, pulmonary hypertension. Patient presented from his PCPs office for evaluation of anasarca. Acute kidney injury as well with cardiorenal syndrome. Pt currently on CRRT (began 8/14).     PT Comments    Pt admitted with above diagnosis. Pt currently with functional limitations due to balance and endurance deficits. Pt was able to ambulate a few steps from recliner to bed.  Nurse in room to assist prn.  Pt also performed some exercises.   Pt will benefit from skilled PT to increase their independence and safety with mobility to allow discharge to the venue listed below.     Follow Up Recommendations  Home health PT     Equipment Recommendations  Other (comment) (Will consider RW versus cane versus none)    Recommendations for Other Services       Precautions / Restrictions Precautions Precautions: Fall Precaution Comments: On CRRT currently and up in chair on arrival Restrictions Weight Bearing Restrictions: No    Mobility  Bed Mobility Overal bed mobility: Needs Assistance Bed Mobility: Sit to Supine       Sit to supine: Min guard   General bed mobility comments: Pt was able to bring LES into bed on his own.   Transfers Overall transfer level: Needs assistance Equipment used: 2 person hand held assist Transfers: Sit to/from [facility] Sit to Stand: Min guard Stand pivot transfers: Min guard;+2 safety/equipment       General transfer comment: Min guard for safety.  Pt did well overall with transfer stand pivot taking a few steps to bed from  recliner.    Ambulation/Gait Ambulation/Gait assistance: Min guard Ambulation Distance (Feet): 4 Feet Assistive device: None Gait Pattern/deviations: Step-through pattern;Decreased step length - right;Decreased step length - left;Decreased stride length Gait velocity: decreased  Gait velocity interpretation: Below normal speed for age/gender General Gait Details: Pt ambulated 4 steps recliner to bed.  Overall steady with single UE support and assist for lines for CRRT and IV.    Stairs            Wheelchair Mobility    Modified Rankin (Stroke Patients Only)       Balance Overall balance assessment: Needs assistance Sitting-balance support: No upper extremity supported;Feet supported Sitting balance-Leahy Scale: Good Sitting balance - Comments: Sat up without back support for 20 min for nurse to bathe pt.    Standing balance support: No upper extremity supported;During functional activity Standing balance-Leahy Scale: Fair Standing balance comment: Pt able to stand statically and side step on own without physical assistance or UE support.  STood about 8 min to have nursing wash private areas.                              Cognition Arousal/Alertness: Awake/alert Behavior During Therapy: WFL for tasks assessed/performed Overall Cognitive Status: Within Functional Limits for tasks assessed                                        Exercises General Exercises - Lower Extremity Long Arc  Quad: AROM;Both;Seated;10 reps Heel Slides: AROM;Both;5 reps;Supine    General Comments General comments (skin integrity, edema, etc.): Nurse bathing pt and PT assisted with transfer back to bed.       Pertinent Vitals/Pain Pain Assessment: No/denies pain    Home Living                      Prior Function            PT Goals (current goals can now be found in the care plan section) Progress towards PT goals: Progressing toward goals     Frequency    Min 3X/week      PT Plan Current plan remains appropriate    Co-evaluation              AM-PAC PT "6 Clicks" Daily Activity  Outcome Measure  Difficulty turning over in bed (including adjusting bedclothes, sheets and blankets)?: None Difficulty moving from lying on back to sitting on the side of the bed? : None Difficulty sitting down on and standing up from a chair with arms (e.g., wheelchair, bedside commode, etc,.)?: A Little Help needed moving to and from a bed to chair (including a wheelchair)?: A Little Help needed walking in hospital room?: A Little Help needed climbing 3-5 steps with a railing? : A Lot 6 Click Score: 19    End of Session Equipment Utilized During Treatment: Gait belt Activity Tolerance: Patient tolerated treatment well Patient left: with bed alarm set;in bed;with call bell/phone within reach;with nursing/sitter in room Nurse Communication: Mobility status PT Visit Diagnosis: Unsteadiness on feet (R26.81);Muscle weakness (generalized) (M62.81)     Time: 6010-9323 PT Time Calculation (min) (ACUTE ONLY): 26 min  Charges:  $Gait Training: 8-22 mins $Therapeutic Exercise: 8-22 mins                    G Codes:       Michael [name],PT Acute Rehabilitation [phone] (pager)    Michael [name] 07/06/2017, 11:25 AM

## 2017-07-06 NOTE — Consult Note (Signed)
Consultation Note Date: 07/06/2017   Patient Name: Michael [name]  DOB: [birth year]  MRN: [id number]  Age / Sex: 56 y.o., male  PCP: [name], MD Referring Physician: Larey Dresser, MD  Reason for Consultation: Establishing goals of care and Psychosocial/spiritual support  HPI/Patient Profile: 56 y.o. 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 Fib, CKD stage IV, and DM2. He presented with significant edema secondary to acute on chronic dCHF. He was admitted on 06/25/2017. He was initially started on Milrinone, then that was stopped once he started CVVHD. Now down 54lb. Plan to stop CVVH within the next 24 hours and monitor for renal recovery. Poor heart/kidney transplant candidate due to minimal social support. If renal function does not recover options are for comfort care versus iHD. Given his degree of RV failure unlikely to tolerate iHD for long. Palliative consulted to assist in goals of care.   Clinical Assessment and Goals of Care: Michael [name] appears younger than his age. I met him at his bedside while he was sitting in his bedside chair on CVVHD. We had a good conversation about his cardiac and renal issues. He has a poor understanding of his health issues, which seems to stem from low health literacy. I talked him through his medical issues, what was being done to help support him, and the next steps. Importantly, I shared that his kidneys needed to start working once the CVVHD was stopped. If they didn't, there were two paths forward. I shared the option of trying iHD, which included many side effects that we discussed. The other option was comfort care with the recognition of end of life.   Michael [name] was very reflective during our conversation. He had not realized the significance of his health issues, or that he may be approaching the  end of his life if his kidney function does not recover. He expressed the desire to "do everything needed to live." If dialysis is needed, he would like to try it. He did seem to grasp the concern for his ability to tolerate dialysis, however he is not yet at the point to consider comfort care only.   Of note, the patient has very poor social support. His only family is a 66yo daughter who is working and in school. He has relied on friends for rides to appointments, but has frequently needed to reschedule things based on friend availability. He lives alone. Also, English is not his first language but he feels he adequately understands it if people speak slowly. I offered and encouraged the use of translator given the significance of our conversation, however he declined one.   Primary Decision Maker PATIENT   SUMMARY OF RECOMMENDATIONS    Full code, full scope care. He would like to try iHD if necessary   Will need to work with CM to set up transport and connect him with community resources if iHD is necessary  Palliative to continue to follow and support him as this plays out. I have attempted to contact his daughter to incorporate her in on this conversation, per his request.   Code Status/Advance Care Planning:  Full code  Symptom Management:   Feeling much better today, no acute complaints  Additional Recommendations (Limitations, Scope, Preferences):  Full Scope Treatment  Psycho-social/Spiritual:   Desire for further Chaplaincy support:no  Additional Recommendations: TBD  Prognosis:   Unable to determine  Discharge Planning:  To Be Determined      Primary Diagnoses: Present on Admission: . AKI (acute kidney injury) ([place]) . CKD (chronic kidney disease), stage IV ([place]) . Essential hypertension . Chronic systolic CHF (congestive heart failure) ([place]) . Chronic atrial fibrillation ([place]) . Gout . Anasarca . Acute on chronic combined systolic and diastolic CHF  (congestive heart failure) ([place])   I have reviewed the medical record, interviewed the patient and family, and examined the patient. The following aspects are pertinent.  Past Medical History:  Diagnosis Date  . Anemia of chronic disease   . Asthma    said he does not know  . Chronic kidney disease (CKD), stage IV (severe) ([place])   . Chronic systolic CHF (congestive heart failure) (H[facility])    a. prior EF normal; b. Echo 10/16: Mild LVH, EF 30-35%, mitral valve bioprosthesis present without evidence of stenosis or regurgitation, severe LAE, mild RVE with mild to moderately reduced RVSF, moderate RAE  . Coronary artery disease   . Diabetes mellitus type 2 in obese ([place])   . Diabetes mellitus without complication ([place])   . Dyspnea   . GERD (gastroesophageal reflux disease)   . H/O tricuspid valve repair 2012  . History of echocardiogram    Echo at [facility] 4/12: mod LVH, EF 60-65%, mod LAE, tissue MVR ok, mod TR, mod pulmo HTN with RVSP 48 mmHg  . Hypertension   . Obstructive sleep apnea   . Paroxysmal atrial fibrillation (H[facility])    s/p Maze procedure at time of MVR in 2011  . Pulmonary HTN ([place])   . S/P mitral valve replacement with porcine valve 2012   Social History   Social History  . Marital status: Single    Spouse name: N/A  . Number of children: N/A  . Years of education: N/A   Occupational History  .      on disability   Social History Main Topics  . Smoking status: Current Some Day Smoker    Packs/day: 1.00    Types: Cigarettes  . Smokeless tobacco: Former Systems developer     Comment: said he is not a regular smoker  . Alcohol use No  . Drug use: No  . Sexual activity: Not Currently   Other Topics Concern  . None   Social History Narrative  . None   Family History  Problem Relation Age of Onset  . Heart attack Neg Hx    Scheduled Meds: . amiodarone  400 mg Oral BID  . atorvastatin  20 mg Oral Daily  . carvedilol  3.125 mg Oral BID WC  . Chlorhexidine  Gluconate Cloth  6 each Topical Daily  . [START ON 07/11/2017] darbepoetin (ARANESP) injection - NON-DIALYSIS  200 mcg Subcutaneous Q Sat-1800  . folic acid  1 mg Oral Daily  . furosemide  160 mg Oral TID  . insulin aspart  0-15 Units Subcutaneous TID WC  . insulin aspart  0-5 Units Subcutaneous QHS  . insulin detemir  10 Units Subcutaneous Q2200  . pantoprazole  40 mg Oral Daily  . predniSONE  40 mg Oral Q breakfast  . sodium chloride  2 spray Each Nare BID  . sodium chloride flush  10-40 mL Intracatheter Q12H  . sodium chloride flush  3 mL Intravenous Q12H  . Warfarin - Pharmacist Dosing Inpatient   Does not apply q1800   Continuous Infusions: . sodium chloride Stopped (07/01/17 1500)  . sodium chloride 500 mL (07/06/17 0813)  . heparin    .  dialysis replacement fluid (prismasate) 300 mL/hr at 07/06/17 0831  . dialysis replacement fluid (prismasate) 300 mL/hr at 07/06/17 0958  . dialysate (PRISMASATE) 1,000 mL/hr at 07/06/17 0450  . sodium phosphate  Dextrose 5% IVPB 30 mmol (07/06/17 0843)   PRN Meds:.sodium chloride, acetaminophen, diphenhydrAMINE, heparin, heparin, HYDROmorphone (DILAUDID) injection, hydrOXYzine, oxyCODONE, sodium chloride flush, sodium chloride flush No Known Allergies   Review of Systems  Constitutional: Positive for activity change and fatigue. Negative for appetite change.  HENT: Negative for congestion, facial swelling, hearing loss, sneezing, sore throat and trouble swallowing.   Eyes: Negative for visual disturbance.  Respiratory: Negative for cough, chest tightness and shortness of breath.   Cardiovascular: Positive for leg swelling (improving). Negative for chest pain.  Gastrointestinal: Negative for abdominal distention, abdominal pain, nausea and vomiting.  Genitourinary: Negative for flank pain.  Musculoskeletal: Positive for gait problem (r/t prior edema). Negative for back pain.  Neurological: Negative for dizziness, speech difficulty and  light-headedness.  Psychiatric/Behavioral: Negative for confusion and decreased concentration. The patient is nervous/anxious.    Physical Exam  Constitutional: He is oriented to person, place, and time. He appears well-developed and well-nourished. No distress.  HENT:  Head: Normocephalic and atraumatic.  Mouth/Throat: Oropharynx is clear and moist. No oropharyngeal exudate.  Eyes: EOM are normal.  Neck:  R IJ catheter  Pulmonary/Chest: Effort normal. No respiratory distress. He has decreased breath sounds in the right lower field and the left lower field.  Abdominal: Soft. Bowel sounds are normal. He exhibits distension (pt feels it looks normal to him).  Musculoskeletal: Normal range of motion. He exhibits no edema.  Neurological: He is alert and oriented to person, place, and time.  Skin: Skin is warm and dry.  Psychiatric: He has a normal mood and affect. His behavior is normal. Judgment and thought content normal.   Vital Signs: BP 118/66   Pulse (!) 108   Temp 98.4 F (36.9 C) (Oral)   Resp 18   Ht _0  (1.651 m)   Wt 69.1 kg (152 lb 5.4 oz)   SpO2 99%   BMI 25.35 kg/m  Pain Assessment: No/denies pain POSS *See Group Information*: 1-Acceptable,Awake and alert Pain Score: 0-No pain   SpO2: SpO2: 99 % O2 Device:SpO2: 99 % O2 Flow Rate: .O2 Flow Rate (L/min): 2 L/min  IO: Intake/output summary:  Intake/Output Summary (Last 24 hours) at 07/06/17 1000 Last data filed at 07/06/17 0900  Gross per 24 hour  Intake             1608 ml  Output             7893 ml  Net            -6285 ml    LBM: Last BM Date: 07/05/17 Baseline Weight: Weight: 91.2 kg (201 lb 1 oz) Most recent weight: Weight: 69.1 kg (152 lb 5.4 oz)     Palliative Assessment/Data:    Time Total: 50 minutes Greater than 50%  of this time was spent counseling and coordinating care related to the above assessment and plan.  Signed by: Charlynn Court, NP Palliative Medicine Team Pager # [phone]  (M-F 7a-5p) Team Phone # [phone] (Nights/Weekends)

## 2017-07-06 NOTE — Progress Notes (Signed)
Subjective: Interval History: has no complaint, less swollen, abdm better..  Objective: Vital signs in last 24 hours: Temp:  [97.6 F (36.4 C)-98.7 F (37.1 C)] 98.5 F (36.9 C) (08/19 2300) Pulse Rate:  [63-136] 108 (08/20 0700) Resp:  [12-21] 15 (08/20 0700) BP: (96-136)/(49-96) 105/69 (08/20 0700) SpO2:  [88 %-100 %] 100 % (08/20 0700) Weight:  [69.1 kg (152 lb 5.4 oz)] 69.1 kg (152 lb 5.4 oz) (08/20 0600) Weight change: -5.5 kg (-12 lb 2 oz)  Intake/Output from previous day: 08/19 0701 - 08/20 0700 In: 1890 [P.O.:1890] Out: 8555 [Urine:175] Intake/Output this shift: No intake/output data recorded.  General appearance: alert, cooperative, no distress and pale Neck: RIJ cath Chest wall: decreased bs, rales R base Cardio: reg Gr2/6 M GI: mild distension, liver down 5 cm, pos  bs Extremities: extremities normal, atraumatic, no cyanosis or edema  Lab Results:  Recent Labs  07/05/17 0339 07/06/17 0336  WBC 10.7* 13.4*  HGB 7.9* 8.3*  HCT 24.7* 26.7*  PLT 199 224   BMET:  Recent Labs  07/05/17 1634 07/06/17 0336  NA 132* 137  K 4.7 4.3  CL 102 101  CO2 25 27  GLUCOSE 196* 132*  BUN 35* 35*  CREATININE 2.24* 2.22*  CALCIUM 8.4* 8.6*   No results for input(s): PTH in the last 72 hours. Iron Studies: No results for input(s): IRON, TIBC, TRANSFERRIN, FERRITIN in the last 72 hours.  Studies/Results: No results found.  I have reviewed the patient's current medications.  Assessment/Plan: 1 AKI/CKD  tol vol off, bp ok. CVP still 23. Will cont CRRT 1 more day at least. Try po diuretics, may tol IHD 2 Anemia ^ esa, iv Fe 3 CM  4 Pulm HTN 5 MVR 6 HPTH check 7 Afib P CRRT, add iv Fe, diuretics    LOS: 9 days   Jeanell Mangan L 07/06/2017,7:16 AM

## 2017-07-06 NOTE — Plan of Care (Signed)
Problem: Fluid Volume: Goal: Ability to maintain a balanced intake and output will improve Outcome: Progressing Pt hemodynamically tolerating CRRT with UF goal of 200-300 cc/hr. I/O balance of -31 L asof this date.  Problem: Activity: Goal: Capacity to carry out activities will improve Outcome: Progressing Pt activity limited d/t ongoing CRRT however tolerates frequent periods of OOB to chair and BSC without difficulty.

## 2017-07-06 NOTE — Progress Notes (Signed)
Oda Kilts NP paged regarding orders for cardioversion.  Orders are for later this week.  He will correct dates for the orders.  Will continue to monitor pt closely.

## 2017-07-06 NOTE — Progress Notes (Signed)
ANTICOAGULATION CONSULT NOTE   Pharmacy Consult for warfarin  Indication: atrial fibrillation  No Known Allergies  Labs:  Recent Labs  07/04/17 0414  07/05/17 0339 07/05/17 1634 07/06/17 0336  HGB 7.8*  --  7.9*  --  8.3*  HCT 24.7*  --  24.7*  --  26.7*  PLT 207  --  199  --  224  LABPROT 28.6*  --  24.5*  --  26.5*  INR 2.63  --  2.16  --  2.39  CREATININE  --   < > 2.17* 2.24* 2.22*  < > = values in this interval not displayed.  Estimated Creatinine Clearance: 31.9 mL/min (A) (by C-G formula based on SCr of 2.22 mg/dL (H)). Pt on warfarin PTA for afib/MVR (bioprosthetic).    s/p RHC indicating RV failure, milrinone stopped. CRRT started 8/14. Hgb down slightly to 7.8, s/p PRBCs 8/14 INR this am is in range at 2.39 CBC low stable Per report from RN Friday, pt having some bloody bowel movements, today's nurse reports only some red streaking.  PTA warfarin dose was 2.5 mg daily  Goal of Therapy:  INR 2-3  Monitor platelets by anticoagulation protocol: Yes   Plan:  -Warfarin 1 mg x 1 tonight. -Daily INR for now  [facility] Pharm.D. CPP, BCPS Clinical Pharmacist [phone] 07/06/2017 10:27 AM

## 2017-07-07 ENCOUNTER — Encounter (HOSPITAL_COMMUNITY): Payer: Self-pay

## 2017-07-07 LAB — RENAL FUNCTION PANEL
ALBUMIN: 3.4 g/dL — AB (ref 3.5–5.0)
Albumin: 3.4 g/dL — ABNORMAL LOW (ref 3.5–5.0)
Anion gap: 8 (ref 5–15)
Anion gap: 9 (ref 5–15)
BUN: 35 mg/dL — AB (ref 6–20)
BUN: 31 mg/dL — AB (ref 6–20)
CALCIUM: 9 mg/dL (ref 8.9–10.3)
CALCIUM: 8.8 mg/dL — AB (ref 8.9–10.3)
CHLORIDE: 100 mmol/L — AB (ref 101–111)
CO2: 27 mmol/L (ref 22–32)
CO2: 25 mmol/L (ref 22–32)
CREATININE: 2.43 mg/dL — AB (ref 0.61–1.24)
CREATININE: 2.18 mg/dL — AB (ref 0.61–1.24)
Chloride: 100 mmol/L — ABNORMAL LOW (ref 101–111)
GFR calc Af Amer: 32 mL/min — ABNORMAL LOW (ref >60)
GFR calc Af Amer: 37 mL/min — ABNORMAL LOW (ref >60)
GFR calc non Af Amer: 32 mL/min — ABNORMAL LOW (ref >60)
GFR, EST NON AFRICAN AMERICAN: 28 mL/min — AB (ref >60)
GLUCOSE: 81 mg/dL (ref 65–99)
GLUCOSE: 178 mg/dL — AB (ref 65–99)
PHOSPHORUS: 4.1 mg/dL (ref 2.5–4.6)
Phosphorus: 4.1 mg/dL (ref 2.5–4.6)
Potassium: 4.5 mmol/L (ref 3.5–5.1)
Potassium: 4.9 mmol/L (ref 3.5–5.1)
SODIUM: 135 mmol/L (ref 135–145)
SODIUM: 134 mmol/L — AB (ref 135–145)

## 2017-07-07 LAB — GLUCOSE, CAPILLARY
GLUCOSE-CAPILLARY: 74 mg/dL (ref 65–99)
GLUCOSE-CAPILLARY: 92 mg/dL (ref 65–99)
Glucose-Capillary: 100 mg/dL — ABNORMAL HIGH (ref 65–99)
Glucose-Capillary: 104 mg/dL — ABNORMAL HIGH (ref 65–99)

## 2017-07-07 LAB — PROTIME-INR
INR: 2.58
PROTHROMBIN TIME: 28.2 s — AB (ref 11.4–15.2)

## 2017-07-07 LAB — MAGNESIUM: Magnesium: 2.8 mg/dL — ABNORMAL HIGH (ref 1.7–2.4)

## 2017-07-07 LAB — CBC
HCT: 26.3 % — ABNORMAL LOW (ref 39.0–52.0)
Hemoglobin: 8.8 g/dL — ABNORMAL LOW (ref 13.0–17.0)
MCH: 25.2 pg — AB (ref 26.0–34.0)
MCHC: 33.5 g/dL (ref 30.0–36.0)
MCV: 75.4 fL — ABNORMAL LOW (ref 78.0–100.0)
PLATELETS: 228 10*3/uL (ref 150–400)
RBC: 3.49 MIL/uL — AB (ref 4.22–5.81)
RDW: 19 % — ABNORMAL HIGH (ref 11.5–15.5)
WBC: 11.9 10*3/uL — AB (ref 4.0–10.5)

## 2017-07-07 MED ORDER — WARFARIN SODIUM 1 MG PO TABS
1.0000 mg | ORAL_TABLET | Freq: Once | ORAL | Status: AC
  Administered 2017-07-07: 1 mg via ORAL
  Filled 2017-07-07: qty 1

## 2017-07-07 NOTE — Addendum Note (Signed)
Addended by: Jiles Harold on: 07/07/2017 12:34 PM   Modules accepted: Level of Service, SmartSet

## 2017-07-07 NOTE — Progress Notes (Signed)
ANTICOAGULATION CONSULT NOTE   Pharmacy Consult for warfarin  Indication: atrial fibrillation  No Known Allergies  Labs:  Recent Labs  07/05/17 0339  07/06/17 0336 07/06/17 1624 07/07/17 0330 07/07/17 0755  HGB 7.9*  --  8.3*  --   --  8.8*  HCT 24.7*  --  26.7*  --   --  26.3*  PLT 199  --  224  --   --  228  LABPROT 24.5*  --  26.5*  --  28.2*  --   INR 2.16  --  2.39  --  2.58  --   CREATININE 2.17*  < > 2.22* 2.30* 2.43*  --   < > = values in this interval not displayed.  Estimated Creatinine Clearance: 29.2 mL/min (A) (by C-G formula based on SCr of 2.43 mg/dL (H)). Pt on warfarin PTA for afib/MVR (bioprosthetic).    s/p RHC indicating RV failure, milrinone stopped. CRRT started 8/14. Hgb down slightly to 7.8, s/p PRBCs 8/14 INR this am is in range at 2.58 CBC low stable Per report from RN Friday, pt having some bloody bowel movements, today's nurse reports only some red streaking. Now resolved PTA warfarin dose was 2.5 mg daily  Goal of Therapy:  INR 2-3  Monitor platelets by anticoagulation protocol: Yes   Plan:  -Warfarin 1 mg x 1 tonight. -Daily INR for now  [facility] Pharm.D. CPP, BCPS Clinical Pharmacist [phone] 07/07/2017 10:04 AM

## 2017-07-07 NOTE — Consult Note (Addendum)
   [facility] CM Inpatient Consult   07/07/2017  Michael [name] 10-05-1961 [id number]    [facility] Care Management follow up.  Chart reviewed and noted Palliative Medicine consult.  Appears [name], per palliative medicine notes, that he plans to pursue dialysis and aggressive care.  Confirmed with inpatient RNCM that [name] will go home with home health services.   Will follow up at bedside again to remind him of ongoing [facility] Management follow up post discharge.   [name], MSN-Ed, RN,BSN [facility] Liaison [phone]

## 2017-07-07 NOTE — Plan of Care (Signed)
Problem: Physical Regulation: Goal: Ability to maintain clinical measurements within normal limits will improve Outcome: Progressing Patient doing well on CRRT, plan is to stop CRRT today 8/21 and assess patient's status and determine if IHD will be needed Goal: Will remain free from infection Outcome: Progressing No signs of infection at this time.  Proper IV site maintenance and CHG baths daily to help prevent infection   Problem: Activity: Goal: Risk for activity intolerance will decrease Outcome: Progressing Plan to increase activity when CRRT has been stopped.  Patient tolerating daily sitting in chair and transferring from bed to chair with ease.   Problem: Fluid Volume: Goal: Ability to maintain a balanced intake and output will improve Outcome: Progressing Patient doing well on CRRT but having difficulty with fluid restriction.  RN has educated patient on the importance of limiting fluid intake and the 1200 mL fluid restriction especially with CRRT being stopped today 8/21.

## 2017-07-07 NOTE — Progress Notes (Signed)
Subjective: Interval History: has no complaint , feels much better with breathing and swelling.  Objective: Vital signs in last 24 hours: Temp:  [97.6 F (36.4 C)-98.4 F (36.9 C)] 98.4 F (36.9 C) (08/21 0730) Pulse Rate:  [79-108] 91 (08/21 0730) Resp:  [11-26] 11 (08/21 0730) BP: (86-118)/(61-85) 94/65 (08/21 0700) SpO2:  [92 %-100 %] 99 % (08/21 0730) Weight:  [67.2 kg (148 lb 2.4 oz)] 67.2 kg (148 lb 2.4 oz) (08/21 0256) Weight change: -1.9 kg (-4 lb 3 oz)  Intake/Output from previous day: 08/20 0701 - 08/21 0700 In: 8295 [P.O.:1000; IV Piggyback:375] Out: 6213 [Urine:105] Intake/Output this shift: No intake/output data recorded.  General appearance: alert, cooperative, no distress and pale Neck: IJ cath Resp: diminished breath sounds bilaterally and rales bibasilar Cardio: regular rate and rhythm, S1, S2 normal and systolic murmur: holosystolic 2/6, blowing at apex GI: pos bs, liver down  5 cm Extremities: extremities normal, atraumatic, no cyanosis or edema  Lab Results:  Recent Labs  07/05/17 0339 07/06/17 0336  WBC 10.7* 13.4*  HGB 7.9* 8.3*  HCT 24.7* 26.7*  PLT 199 224   BMET:  Recent Labs  07/06/17 1624 07/07/17 0330  NA 135 135  K 4.6 4.5  CL 101 100*  CO2 25 27  GLUCOSE 139* 81  BUN 35* 35*  CREATININE 2.30* 2.43*  CALCIUM 8.7* 9.0   No results for input(s): PTH in the last 72 hours. Iron Studies: No results for input(s): IRON, TIBC, TRANSFERRIN, FERRITIN in the last 72 hours.  Studies/Results: No results found.  I have reviewed the patient's current medications.  Assessment/Plan: 1 CKD4/AKI vol better.  Little urine.  Will try off CRRT , use IHD and see if tolerates 2 Anemia esa/Fe 3 HPTH check 4 CM improving 5 DM  6 MV dz P stop CRRT, IHD,  Diet, meds. Counsel, will need PC soon   LOS: 10 days   Kourtney Montesinos L 07/07/2017,7:55 AM

## 2017-07-07 NOTE — Progress Notes (Signed)
Patient ID: Michael [name], male   DOB: Oct 15, 1961, 56 y.o.   MRN: [id number]     Advanced Heart Failure Rounding Note  Primary Cardiologist: Dr. Harrington Challenger HF Cardiology: Luanna Cole  Subjective:    Started CVVHD 8/15 and stopped milrinone.  Continues on CVVHD pulling 200 per hour. CVP down to 14-15. Yesterday started on 160 mg IV lasix every 8 hours, UOP only about 100 cc. Weight down another 4 pounds.   Denies SOB. Complaining of fatigue.   Studies: Echo (7/18): EF 50-55%, mild LVH, mild AI, bioprosthetic mitral valve appeared to function normally, RV moderately dilated with moderately decreased systolic function, D-shaped interventricular septum suggestive of RV pressure/volume overload, severe TR, PASP 54 mmHg  [facility] Procedural Findings: Hemodynamics (mmHg) RA mean 27 RV 63/8, mean 20 PA 58/28, mean 40 PCWP mean 28 Oxygen saturations: PA 69% AO 96% Cardiac Output (Fick) 9.82  Cardiac Index (Fick) 4.96 PVR 1.2 WU  Objective:   Weight Range: 148 lb 2.4 oz (67.2 kg) Body mass index is 24.65 kg/m.   Vital Signs:   Temp:  [97.6 F (36.4 C)-98.4 F (36.9 C)] 98 F (36.7 C) (08/21 0300) Pulse Rate:  [79-108] 82 (08/21 0600) Resp:  [12-26] 25 (08/21 0600) BP: (86-118)/(61-85) 116/65 (08/21 0600) SpO2:  [92 %-100 %] 100 % (08/21 0600) Weight:  [148 lb 2.4 oz (67.2 kg)] 148 lb 2.4 oz (67.2 kg) (08/21 0256) Last BM Date: 07/06/17  Weight change: Filed Weights   07/05/17 0600 07/06/17 0600 07/07/17 0256  Weight: 164 lb 7.4 oz (74.6 kg) 152 lb 5.4 oz (69.1 kg) 148 lb 2.4 oz (67.2 kg)    Intake/Output:   Intake/Output Summary (Last 24 hours) at 07/07/17 0700 Last data filed at 07/07/17 0600  Gross per 24 hour  Intake             1375 ml  Output             5535 ml  Net            -4160 ml      Physical Exam  CVP 15. Personally checked.  General:  Well appearing. No resp difficulty. In bed  HEENT: normal Neck: supple. JVP 12 cm. RIJ HD catheter.  Carotids 2+  bilat; no bruits. No lymphadenopathy or thryomegaly appreciated. Cor: PMI nondisplaced. Irregular rate & rhythm. + RV lift.  No rubs, gallops or murmurs. Lungs: clear Abdomen: soft, nontender, nondistended. No hepatosplenomegaly. No bruits or masses. Good bowel sounds. Extremities: no cyanosis, clubbing, rash, edema Neuro: alert & orientedx3, cranial nerves grossly intact. moves all 4 extremities w/o difficulty. Affect pleasant  Telemetry  Personally reviewed. A fib 70s.   Labs    CBC  Recent Labs  07/05/17 0339 07/06/17 0336  WBC 10.7* 13.4*  HGB 7.9* 8.3*  HCT 24.7* 26.7*  MCV 76.2* 76.7*  PLT 199 546   Basic Metabolic Panel  Recent Labs  07/06/17 0336 07/06/17 1624 07/07/17 0330  NA 137 135 135  K 4.3 4.6 4.5  CL 101 101 100*  CO2 27 25 27   GLUCOSE 132* 139* 81  BUN 35* 35* 35*  CREATININE 2.22* 2.30* 2.43*  CALCIUM 8.6* 8.7* 9.0  MG 2.8*  --  2.8*  PHOS 1.6* 3.8 4.1   Liver Function Tests  Recent Labs  07/06/17 1624 07/07/17 0330  ALBUMIN 3.4* 3.4*   No results for input(s): LIPASE, AMYLASE in the last 72 hours. Cardiac Enzymes No results for input(s): CKTOTAL, CKMB, CKMBINDEX, TROPONINI in  the last 72 hours.  BNP: BNP (last 3 results)  Recent Labs  10/05/16 0147 05/30/17 1208 06/25/17 1253  BNP 237.4* 1,404.9* 875.7*    ProBNP (last 3 results)  Recent Labs  03/16/17 1401  PROBNP 6,530*        Imaging   Transthoracic Echocardiography 06/01/17  Study Conclusions  - Left ventricle: The cavity size was normal. There was mild   concentric hypertrophy. Systolic function was normal. The   estimated ejection fraction was in the range of 50% to 55%. Wall   motion was normal; there were no regional wall motion   abnormalities. - Ventricular septum: The contour showed diastolic flattening. - Aortic valve: Transvalvular velocity was minimally increased.   There was no stenosis. There was mild regurgitation. Peak   velocity (S): 201  cm/s. - Mitral valve: A bioprosthesis was present and functioning   normally. Mean gradient (D): 6 mm Hg. Valve area by pressure   half-time: 1.93 cm^2. - Left atrium: The atrium was severely dilated. - Right ventricle: The cavity size was moderately dilated. Wall   thickness was normal. Systolic function was moderately reduced. - Right atrium: The atrium was moderately dilated. - Tricuspid valve: There was severe regurgitation. - Pulmonary arteries: Systolic pressure was moderately increased.   PA peak pressure: 54 mm Hg (S).   RIGHT HEART CATH 06/29/17 RA mean 27 RV 63/8, mean 20 PA 58/28, mean 40 PCWP mean 28  Oxygen saturations: PA 69% AO 96%  Cardiac Output (Fick) 9.82  Cardiac Index (Fick) 4.96 PVR 1.2 WU     Medications:     Scheduled Medications: . amiodarone  400 mg Oral BID  . atorvastatin  20 mg Oral Daily  . carvedilol  3.125 mg Oral BID WC  . Chlorhexidine Gluconate Cloth  6 each Topical Daily  . [START ON 07/11/2017] darbepoetin (ARANESP) injection - NON-DIALYSIS  200 mcg Subcutaneous Q Sat-1800  . folic acid  1 mg Oral Daily  . furosemide  160 mg Oral TID  . insulin aspart  0-15 Units Subcutaneous TID WC  . insulin aspart  0-5 Units Subcutaneous QHS  . insulin aspart  4 Units Subcutaneous TID WC  . insulin detemir  10 Units Subcutaneous Q2200  . pantoprazole  40 mg Oral Daily  . [START ON 07/09/2017] predniSONE  10 mg Oral Once  . [START ON 07/08/2017] predniSONE  20 mg Oral Once  . predniSONE  30 mg Oral Once  . [START ON 07/10/2017] predniSONE  5 mg Oral Once  . sodium chloride  2 spray Each Nare BID  . sodium chloride flush  10-40 mL Intracatheter Q12H  . sodium chloride flush  3 mL Intravenous Q12H  . Warfarin - Pharmacist Dosing Inpatient   Does not apply q1800    Infusions: . sodium chloride Stopped (07/01/17 1500)  . sodium chloride Stopped (07/06/17 0814)  . heparin 999 mL/hr at 07/06/17 1340  . dialysis replacement fluid (prismasate) 300  mL/hr at 07/07/17 0203  . dialysis replacement fluid (prismasate) 300 mL/hr at 07/07/17 0245  . 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 Patient underwent mitral valve replacement and tricuspid valve repair in [place] in 2012.    Assessment/Plan   1. Acute on chronic diastolic CHF/RV failure: EF 50-55% on echo in 7/18 with prominent RV failure (enlarged/hypokinetic RV with D-shaped interventricular septum), severe TR.  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  Milrinone was started at admission for RV support, stopped now that he is getting CVVH. Overall weight down 53 pounds. Volume status improving. CVP 15. Baseline CVP will need to be somewhat elevated most likely with RV failure.  - Avoid over-pulling as he is preload dependent with RV failure. May be nearing point where we need to hold CVVH and see how he does.  - Continue Coreg 3.125 mg BID for now.  2. AKI on CKD stage IV: Now on CVVH and off milrinone.  Also on lasix 160 mg three times a day. Making a small amount of urine but no vigorous. Will have to see if kidneys recover without CVVH.  Long-term plan remains quite challenging. He has tolerated CVVH so may do ok with iHD though long-term will be difficult with RV failure.  He lives alone and doesn't have much help with his healthcare  so chance at heart-kidney transplant would be very low. Hopefully, renal function will improve. If not, will need trial of iHD to see if he tolerates.  3. Atrial fibrillation: Persistent.  Has been in atrial fibrillation for a number of months it appears.   Not sure how well he will stay in NSR given severe TR and RV failure (even with amiodarone), but given the severity of his HF will attempt to return him to NSR.  - Rate controlled. Continue amio 400 mg twice a day.  - Plan for TEE/DCCV, scheduled for Thursday.    - Continue warfarin.  INR 2.58. Dosing discussed with Pharm D.  4. Anemia: Had small amount of BRBPR last week. Now stopped.  - CBC in am. Continue to follow. Transfuse prn.  - Started on ESA per renal 5. Bioprosthetic mitral valve: Stable appearance on 7/18 echo.  No change.  6. Probable sleep apnea:  - Will need sleep study outpatient. No change.     Length of Stay: [place], NP  07/07/2017, 7:00 AM  Advanced Heart Failure Team Pager [phone] (M-F; 7a - 4p)  Please contact [facility] for night-coverage after hours (4p -7a ) and weekends on [email or web address]   Patient seen with NP, agree with the above note.  CVP remains about 14-15.  I suspect he is nearing his ideal CVP given RV failure and a degree of preload dependence.  He is on po Lasix without much UOP so far. Soon will need to stop CVVH to see if he is able to control volume on his own.  If not, will need trial of HD.  He has tolerated CVVH well so may be ok with iHD though RV failure will make this challenging.     TEE-guided DCCV scheduled for Thursday to see if we can get him back in NSR.  He remains on amiodarone and warfarin.   I suspect heart/kidney transplant will not be a viable option for him, limited social situation.  [name] 07/07/2017 7:48 AM

## 2017-07-07 NOTE — Progress Notes (Signed)
Daily Progress Note   Patient Name: Michael [name]       Date: 07/07/2017 DOB: 06-24-1961  Age: 56 y.o. MRN#: [id number] Attending Physician: Larey Dresser, MD Primary Care Physician: [name], MD Admit Date: 06/25/2017  Reason for Consultation/Follow-up: Establishing goals of care and Psychosocial/spiritual support  Subjective: Michael [name] is feeling well this morning. He is surprised by how much the swelling has decreased, and has been feeling better about his improved ability to ambulate. He has no acute complaints. He is questioning when the CVVHD will stop, as he expected it to end this morning.   Length of Stay: 10  Current Medications: Scheduled Meds:  . amiodarone  400 mg Oral BID  . atorvastatin  20 mg Oral Daily  . carvedilol  3.125 mg Oral BID WC  . Chlorhexidine Gluconate Cloth  6 each Topical Daily  . [START ON 07/11/2017] darbepoetin (ARANESP) injection - NON-DIALYSIS  200 mcg Subcutaneous Q Sat-1800  . folic acid  1 mg Oral Daily  . furosemide  160 mg Oral TID  . insulin aspart  0-15 Units Subcutaneous TID WC  . insulin aspart  0-5 Units Subcutaneous QHS  . insulin aspart  4 Units Subcutaneous TID WC  . insulin detemir  10 Units Subcutaneous Q2200  . pantoprazole  40 mg Oral Daily  . [START ON 07/09/2017] predniSONE  10 mg Oral Once  . [START ON 07/08/2017] predniSONE  20 mg Oral Once  . [START ON 07/10/2017] predniSONE  5 mg Oral Once  . sodium chloride  2 spray Each Nare BID  . sodium chloride flush  10-40 mL Intracatheter Q12H  . sodium chloride flush  3 mL Intravenous Q12H  . Warfarin - Pharmacist Dosing Inpatient   Does not apply q1800    Continuous Infusions: . sodium chloride Stopped (07/01/17 1500)  . sodium chloride Stopped (07/06/17 0814)  . heparin 999 mL/hr at 07/06/17 1340  . dialysis  replacement fluid (prismasate) 300 mL/hr at 07/07/17 0203  . dialysis replacement fluid (prismasate) 300 mL/hr at 07/07/17 0245  . dialysate (PRISMASATE) 1,000 mL/hr at 07/07/17 0714    PRN Meds: sodium chloride, acetaminophen, diphenhydrAMINE, heparin, heparin, HYDROmorphone (DILAUDID) injection, hydrOXYzine, oxyCODONE, sodium chloride flush, sodium chloride flush  Physical Exam  Constitutional: He is oriented to person, place, and time. He appears well-developed and well-nourished. No distress.  HENT:  Head: Normocephalic and atraumatic.  Mouth/Throat: Oropharynx is clear and moist. No oropharyngeal exudate.  Eyes: EOM are normal.  Neck:  R IJ catheter  Pulmonary/Chest: Effort normal. No respiratory distress. He has decreased breath sounds in the right lower field and the left lower field.  Abdominal: Soft. Bowel sounds are normal. He exhibits distension (improved today)  Musculoskeletal: Normal range of motion. He exhibits no edema.  Neurological: He is alert and oriented to person, place, and time.  Skin: Skin is warm and dry.  Psychiatric: He has a normal mood and affect. His behavior is normal. Judgment and thought content normal.           Vital Signs: BP 107/72   Pulse 84   Temp 98.4 F (36.9 C) (Oral)   Resp 14   Ht _0  (1.651 m)  Wt 67.2 kg (148 lb 2.4 oz)   SpO2 98%   BMI 24.65 kg/m  SpO2: SpO2: 98 % O2 Device: O2 Device: Not Delivered O2 Flow Rate: O2 Flow Rate (L/min): 2 L/min  Intake/output summary:  Intake/Output Summary (Last 24 hours) at 07/07/17 0943 Last data filed at 07/07/17 0900  Gross per 24 hour  Intake             1082 ml  Output             5269 ml  Net            -4187 ml   LBM: Last BM Date: 07/06/17 Baseline Weight: Weight: 91.2 kg (201 lb 1 oz) Most recent weight: Weight: 67.2 kg (148 lb 2.4 oz)      Palliative Assessment/Data: PPS 50%    Patient Active Problem List   Diagnosis Date Noted  . Goals of care, counseling/discussion    . Palliative care by specialist   . Acute on chronic combined systolic and diastolic CHF (congestive heart failure) ([place]) 06/27/2017  . Acute kidney injury ([place]) 06/25/2017  . Anasarca 05/30/2017  . Acute exacerbation of CHF (congestive heart failure) ([place]) 05/30/2017  . HLD (hyperlipidemia) 10/05/2016  . Mouth bleeding 10/05/2016  . Bilateral knee pain 10/05/2016  . Petechial rash 10/05/2016  . Laceration of tongue   . Insomnia 06/10/2016  . Supratherapeutic INR 06/01/2016  . Acute hepatic encephalopathy 03/15/2016  . Liver cirrhosis ([place]) 03/15/2016  . Volume overload 03/15/2016  . Acute congestive heart failure ([place])   . Insulin dependent diabetes mellitus ([place])   . Acute renal failure (ARF) ([place]) 03/14/2016  . GERD (gastroesophageal reflux disease) 03/04/2016  . Aortic regurgitation 02/27/2016  . Viral gastroenteritis 02/27/2016  . A-fib ([place]) 02/26/2016  . Abdominal pain 02/25/2016  . Diabetes mellitus with complication ([place]) 58/83/2549  . Chronic atrial fibrillation with RVR ([place]) 02/25/2016  . Acute on chronic systolic CHF (congestive heart failure) ([place]) 02/25/2016  . Anemia, chronic disease 02/25/2016  . Atrial fibrillation with RVR ([place]) 02/19/2016  . Pain in joint, ankle and foot 01/10/2016  . Asthma   . Acute hypoxemic respiratory failure ([place]) 12/29/2015  . Acute respiratory failure ([place]) 12/29/2015  . Chronic atrial fibrillation ([place]) 12/24/2015  . Dilated cardiomyopathy ([place]) 12/24/2015  . Polypharmacy 11/23/2015  . Gout 10/23/2015  . Encounter for therapeutic drug monitoring 10/19/2015  . Anemia - mild exacerbation 10/15/2015  . Chronic systolic CHF (congestive heart failure) ([place]) 10/09/2015  . Tricuspid valve disease 10/09/2015  . Mitral valve disease   . CKD (chronic kidney disease), stage IV ([place]) 09/10/2015  . Calf pain 09/10/2015  . DM type 2 (diabetes mellitus, type 2) ([place]) 09/10/2015  . Essential hypertension 09/10/2015    Palliative Care  Assessment & Plan   HPI: 56 y.o. 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 Fib, CKD stage IV, and DM2. He presented with significant edema secondary to acute on chronic dCHF. He was admitted on 06/25/2017. He was initially started on Milrinone, then that was stopped once he started CVVHD. Now down 54lb. Plan to stop CVVHD within the next 24 hours and monitor for renal recovery. Poor heart/kidney transplant candidate due to minimal social support. If renal function does not recover options are for comfort care versus iHD. Given his degree of RV failure unlikely to tolerate iHD for long. Palliative consulted to assist in  goals of care.   Assessment: I met with Michael [name] again today to follow-up on our conversation from yesterday. On our initial meeting we had a hard conversation about his health issues and what the future might look like. He expressed the desire to "do everything needed to live," which I confirmed meant that he would want dialysis if his kidneys did not recover, and he would want all aggressive measures taken to prolong his life (full code status). He is not yet at the point to consider comfort care only if there is anything else that can be tried.  Today, he is a bit more withdrawn. He shared that he had been thinking about our conversation and still felt like "doing everything" was the right plan for him at this point. I broached the possibility of needing more support at discharge, and he was amenable to home health services. Given his minimal social support, I will also place a consult for CM to begin working on community resources to assist him with transportation to appointments. At his request, I will also update his daughter. I communicated with her yesterday and she is only free after 5:30 today.   Recommendations/Plan:  Full code, full scope aggressive care. Pt does want  dialysis if his renal function does not recover  CM consult placed for home health needs, to include referral to community resources for transportation needs  Plan to call daughter at 5:30 today to provide clinical update  Goals of Care and Additional Recommendations:  Limitations on Scope of Treatment: Full Scope Treatment  Code Status:  Full code  Prognosis:   Unable to determine  Discharge Planning:  Home with [place] was discussed with pt.  Thank you for allowing the Palliative Medicine Team to assist in the care of this patient.   Total time: 25 minutes Greater than 50%  of this time was spent counseling and coordinating care related to the above assessment and plan.  Charlynn Court, NP Palliative Medicine Team [phone] pager (7a-5p) Team Phone # [phone]

## 2017-07-07 NOTE — Progress Notes (Signed)
This encounter was created in error - please disregard.

## 2017-07-08 DIAGNOSIS — Z992 Dependence on renal dialysis: Secondary | ICD-10-CM

## 2017-07-08 DIAGNOSIS — N186 End stage renal disease: Secondary | ICD-10-CM

## 2017-07-08 LAB — RENAL FUNCTION PANEL
ALBUMIN: 3.2 g/dL — AB (ref 3.5–5.0)
ALBUMIN: 3.3 g/dL — AB (ref 3.5–5.0)
Anion gap: 10 (ref 5–15)
Anion gap: 10 (ref 5–15)
BUN: 44 mg/dL — AB (ref 6–20)
BUN: 55 mg/dL — AB (ref 6–20)
CO2: 24 mmol/L (ref 22–32)
CO2: 22 mmol/L (ref 22–32)
CREATININE: 3.57 mg/dL — AB (ref 0.61–1.24)
Calcium: 9 mg/dL (ref 8.9–10.3)
Calcium: 8.7 mg/dL — ABNORMAL LOW (ref 8.9–10.3)
Chloride: 102 mmol/L (ref 101–111)
Chloride: 99 mmol/L — ABNORMAL LOW (ref 101–111)
Creatinine, Ser: 3.02 mg/dL — ABNORMAL HIGH (ref 0.61–1.24)
GFR calc Af Amer: 20 mL/min — ABNORMAL LOW (ref >60)
GFR, EST AFRICAN AMERICAN: 25 mL/min — AB (ref >60)
GFR, EST NON AFRICAN AMERICAN: 21 mL/min — AB (ref >60)
GFR, EST NON AFRICAN AMERICAN: 18 mL/min — AB (ref >60)
Glucose, Bld: 85 mg/dL (ref 65–99)
Glucose, Bld: 123 mg/dL — ABNORMAL HIGH (ref 65–99)
PHOSPHORUS: 4.7 mg/dL — AB (ref 2.5–4.6)
PHOSPHORUS: 6.7 mg/dL — AB (ref 2.5–4.6)
POTASSIUM: 5 mmol/L (ref 3.5–5.1)
Potassium: 4.4 mmol/L (ref 3.5–5.1)
Sodium: 136 mmol/L (ref 135–145)
Sodium: 131 mmol/L — ABNORMAL LOW (ref 135–145)

## 2017-07-08 LAB — CBC
HEMATOCRIT: 26.7 % — AB (ref 39.0–52.0)
Hemoglobin: 8.7 g/dL — ABNORMAL LOW (ref 13.0–17.0)
MCH: 24.9 pg — ABNORMAL LOW (ref 26.0–34.0)
MCHC: 32.6 g/dL (ref 30.0–36.0)
MCV: 76.5 fL — ABNORMAL LOW (ref 78.0–100.0)
PLATELETS: 212 10*3/uL (ref 150–400)
RBC: 3.49 MIL/uL — AB (ref 4.22–5.81)
RDW: 19.5 % — AB (ref 11.5–15.5)
WBC: 11.4 10*3/uL — AB (ref 4.0–10.5)

## 2017-07-08 LAB — HEPATITIS C ANTIBODY (REFLEX): HCV Ab: <0.1 s/co ratio (ref 0.0–0.9)

## 2017-07-08 LAB — HCV COMMENT:

## 2017-07-08 LAB — PROTIME-INR
INR: 2.26
Prothrombin Time: 25.3 seconds — ABNORMAL HIGH (ref 11.4–15.2)

## 2017-07-08 LAB — GLUCOSE, CAPILLARY
GLUCOSE-CAPILLARY: 154 mg/dL — AB (ref 65–99)
Glucose-Capillary: 75 mg/dL (ref 65–99)
Glucose-Capillary: 134 mg/dL — ABNORMAL HIGH (ref 65–99)
Glucose-Capillary: 162 mg/dL — ABNORMAL HIGH (ref 65–99)

## 2017-07-08 LAB — MAGNESIUM: Magnesium: 2.7 mg/dL — ABNORMAL HIGH (ref 1.7–2.4)

## 2017-07-08 LAB — HEPATITIS B CORE ANTIBODY, IGM: Hep B C IgM: NEGATIVE

## 2017-07-08 LAB — HEPATITIS B SURFACE ANTIGEN: HEP B S AG: NEGATIVE

## 2017-07-08 LAB — HEPATITIS B SURFACE ANTIBODY,QUALITATIVE: HEP B S AB: REACTIVE

## 2017-07-08 MED ORDER — WARFARIN SODIUM 2.5 MG PO TABS
2.5000 mg | ORAL_TABLET | Freq: Once | ORAL | Status: AC
  Administered 2017-07-08: 2.5 mg via ORAL
  Filled 2017-07-08: qty 1

## 2017-07-08 NOTE — Progress Notes (Signed)
Patient ID: Michael [name], male   DOB: 25-Mar-1961, 56 y.o.   MRN: [id number]     Advanced Heart Failure Rounding Note  Primary Cardiologist: Dr. Harrington Challenger HF Cardiology: Luanna Cole  Subjective:    Started CVVHD 8/15 and stopped milrinone.  CVVH stopped yesterday with plan for IHD today.  Weight up 3 lbs, CVP around 20.  Still feels better overall. Remains in atrial fibrillation.   Studies: Echo (7/18): EF 50-55%, mild LVH, mild AI, bioprosthetic mitral valve appeared to function normally, RV moderately dilated with moderately decreased systolic function, D-shaped interventricular septum suggestive of RV pressure/volume overload, severe TR, PASP 54 mmHg  [facility] Procedural Findings: Hemodynamics (mmHg) RA mean 27 RV 63/8, mean 20 PA 58/28, mean 40 PCWP mean 28 Oxygen saturations: PA 69% AO 96% Cardiac Output (Fick) 9.82  Cardiac Index (Fick) 4.96 PVR 1.2 WU  Objective:   Weight Range: 151 lb 14.4 oz (68.9 kg) Body mass index is 25.28 kg/m.   Vital Signs:   Temp:  [98 F (36.7 C)-98.2 F (36.8 C)] 98.1 F (36.7 C) (08/22 0300) Pulse Rate:  [35-105] 87 (08/22 0300) Resp:  [12-27] 13 (08/22 0300) BP: (81-117)/(52-82) 96/60 (08/22 0300) SpO2:  [91 %-100 %] 100 % (08/22 0300) Weight:  [151 lb 14.4 oz (68.9 kg)] 151 lb 14.4 oz (68.9 kg) (08/22 0300) Last BM Date: 07/07/17  Weight change: Filed Weights   07/06/17 0600 07/07/17 0256 07/08/17 0300  Weight: 152 lb 5.4 oz (69.1 kg) 148 lb 2.4 oz (67.2 kg) 151 lb 14.4 oz (68.9 kg)    Intake/Output:   Intake/Output Summary (Last 24 hours) at 07/08/17 0746 Last data filed at 07/08/17 0700  Gross per 24 hour  Intake          1265.17 ml  Output             2123 ml  Net          -857.83 ml      Physical Exam  CVP 20 General:  Well appearing. No resp difficulty. In bed  HEENT: normal Neck: supple. JVP 14-16 cm. RIJ HD catheter.  Carotids 2+ bilat; no bruits. No lymphadenopathy or thryomegaly appreciated. Cor: PMI  nondisplaced. Irregular rate & rhythm. + RV lift.  No rubs, gallops or murmurs. Lungs: clear Abdomen: soft, nontender, nondistended. No hepatosplenomegaly. No bruits or masses. Good bowel sounds. Extremities: no cyanosis, clubbing, rash, edema Neuro: alert & orientedx3, cranial nerves grossly intact. moves all 4 extremities w/o difficulty. Affect pleasant  Telemetry  Personally reviewed. A fib 70s.   Labs    CBC  Recent Labs  07/07/17 0755 07/08/17 0338  WBC 11.9* 11.4*  HGB 8.8* 8.7*  HCT 26.3* 26.7*  MCV 75.4* 76.5*  PLT 228 169   Basic Metabolic Panel  Recent Labs  07/07/17 0330 07/07/17 1722 07/08/17 0338 07/08/17 0353  NA 135 134*  --  136  K 4.5 4.9  --  4.4  CL 100* 100*  --  102  CO2 27 25  --  24  GLUCOSE 81 178*  --  85  BUN 35* 31*  --  44*  CREATININE 2.43* 2.18*  --  3.02*  CALCIUM 9.0 8.8*  --  9.0  MG 2.8*  --  2.7*  --   PHOS 4.1 4.1  --  4.7*   Liver Function Tests  Recent Labs  07/07/17 1722 07/08/17 0353  ALBUMIN 3.4* 3.2*   No results for input(s): LIPASE, AMYLASE in the last 72 hours.  Cardiac Enzymes No results for input(s): CKTOTAL, CKMB, CKMBINDEX, TROPONINI in the last 72 hours.  BNP: BNP (last 3 results)  Recent Labs  10/05/16 0147 05/30/17 1208 06/25/17 1253  BNP 237.4* 1,404.9* 875.7*    ProBNP (last 3 results)  Recent Labs  03/16/17 1401  PROBNP 6,530*        Imaging   Transthoracic Echocardiography 06/01/17  Study Conclusions  - Left ventricle: The cavity size was normal. There was mild   concentric hypertrophy. Systolic function was normal. The   estimated ejection fraction was in the range of 50% to 55%. Wall   motion was normal; there were no regional wall motion   abnormalities. - Ventricular septum: The contour showed diastolic flattening. - Aortic valve: Transvalvular velocity was minimally increased.   There was no stenosis. There was mild regurgitation. Peak   velocity (S): 201 cm/s. -  Mitral valve: A bioprosthesis was present and functioning   normally. Mean gradient (D): 6 mm Hg. Valve area by pressure   half-time: 1.93 cm^2. - Left atrium: The atrium was severely dilated. - Right ventricle: The cavity size was moderately dilated. Wall   thickness was normal. Systolic function was moderately reduced. - Right atrium: The atrium was moderately dilated. - Tricuspid valve: There was severe regurgitation. - Pulmonary arteries: Systolic pressure was moderately increased.   PA peak pressure: 54 mm Hg (S).   RIGHT HEART CATH 06/29/17 RA mean 27 RV 63/8, mean 20 PA 58/28, mean 40 PCWP mean 28  Oxygen saturations: PA 69% AO 96%  Cardiac Output (Fick) 9.82  Cardiac Index (Fick) 4.96 PVR 1.2 WU     Medications:     Scheduled Medications: . amiodarone  400 mg Oral BID  . atorvastatin  20 mg Oral Daily  . carvedilol  3.125 mg Oral BID WC  . Chlorhexidine Gluconate Cloth  6 each Topical Daily  . [START ON 07/11/2017] darbepoetin (ARANESP) injection - NON-DIALYSIS  200 mcg Subcutaneous Q Sat-1800  . folic acid  1 mg Oral Daily  . furosemide  160 mg Oral TID  . insulin aspart  0-15 Units Subcutaneous TID WC  . insulin aspart  0-5 Units Subcutaneous QHS  . insulin aspart  4 Units Subcutaneous TID WC  . insulin detemir  10 Units Subcutaneous Q2200  . pantoprazole  40 mg Oral Daily  . [START ON 07/09/2017] predniSONE  10 mg Oral Once  . predniSONE  20 mg Oral Once  . [START ON 07/10/2017] predniSONE  5 mg Oral Once  . sodium chloride  2 spray Each Nare BID  . sodium chloride flush  10-40 mL Intracatheter Q12H  . sodium chloride flush  3 mL Intravenous Q12H  . Warfarin - Pharmacist Dosing Inpatient   Does not apply q1800    Infusions: . sodium chloride Stopped (07/01/17 1500)  . sodium chloride Stopped (07/06/17 0814)  . heparin 999 mL/hr at 07/06/17 1340  . dialysis replacement fluid (prismasate) 300 mL/hr at 07/07/17 0203  . dialysis replacement fluid  (prismasate) 300 mL/hr at 07/07/17 0245  . 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 Patient underwent mitral valve replacement and tricuspid valve repair in [place] in 2012.    Assessment/Plan   1. Acute on chronic diastolic CHF/RV failure: EF 50-55% on echo in 7/18 with prominent RV failure (enlarged/hypokinetic RV with D-shaped interventricular septum), severe TR.  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  Milrinone was started at admission for RV support, stopped now that he is getting RRT.  CVVH stopped yesterday, plan for IHD today.  Weight up 3 lbs off CVVH and CVP 20.  - For IHD/volume removal today.   - Continue Coreg 3.125 mg BID for now.  2. AKI on CKD stage IV: Now getting RRT and off milrinone.  Also on lasix 160 mg three times a day. Making a small amount of urine but not vigorous => suspect he is going to be dependent on HD.  Long-term plan remains quite challenging. He has tolerated CVVH so may do ok with IHD though long-term will be difficult with RV failure.  He lives alone and doesn't have much help with his healthcare so chance at heart-kidney transplant would be very low.  - Trial IHD today.  3. Atrial fibrillation: Persistent.  Has been in atrial fibrillation for a number of months it appears.   Not sure how well he will  stay in NSR given severe TR and RV failure (even with amiodarone), but given the severity of his HF will attempt to return him to NSR.  - Rate controlled. Continue amio 400 mg twice a day.  - Plan for TEE/DCCV, scheduled for Thursday.    - Continue warfarin.  INR 2.26.  4. Anemia: Had small amount of BRBPR last week. Now stopped.  - CBC in am. Continue to follow. Transfuse prn.  - Started on ESA per renal 5. Bioprosthetic mitral valve: Stable appearance on 7/18 echo.  No change.  6. Probable sleep apnea:  - Will need sleep study outpatient. No change.     Length of Stay: 48  [name], MD  07/08/2017, 7:46 AM  Advanced Heart Failure Team Pager [phone] (M-F; 7a - 4p)  Please contact [facility] for night-coverage after hours (4p -7a ) and weekends on [email or web address]

## 2017-07-08 NOTE — Consult Note (Signed)
[facility] NOTE   MRN : [id number]  Reason for Consult: ESRD Referring Physician: Dr. Jimmy Footman  History of Present Illness: 56 y/o male with CHF,  anemia of chronic disease, chronic kidney disease stage IV, systolic heart failure, diabetes mellitus, GERD, history of tricuspid valve repair, essential hypertension, atrial fibrillation, pulmonary hypertension.   He reported to the ED secondary to edema, SOB that has been worsening over the past 2 weeks.     Labs admission: Vitals: Afebrile, pulse of 86, respirations of 19, normotensive, 97% on room air Labs: Creatinine of 3.02, BUN of 55, glucose of 145, BNP of 875.7, hemoglobin of 8, platelets 177 Imaging: Chest x-ray significant for cardiomegaly with mild pulmonary vascular congestion and trace bilateral effusions Medications/Course: Lasix 80mg  IV  AKI/CKD stage 3-4 (baseline Scr 1.8-2.4) with multiple episodes of AKI related to cardiorenal syndrome/decompensated RV failure. Now not responding to high dose IV lasix.  We have been consulted to provide HD access tunneled catheter and permanent access.  Initiated CVVHD with UF on 06/30/17.    Current Facility-Administered Medications  Medication Dose Route Frequency Provider Last Rate Last Dose  . 0.9 %  sodium chloride infusion   Intravenous Continuous Larey Dresser, MD   Stopped at 07/01/17 1500  . 0.9 %  sodium chloride infusion   Intravenous PRN Larey Dresser, MD   Stopped at 07/06/17 [phone]  . acetaminophen (TYLENOL) tablet 325 mg  325 mg Oral Q6H PRN Modena Jansky, MD   325 mg at 07/06/17 0306  . amiodarone (PACERONE) tablet 400 mg  400 mg Oral BID Larey Dresser, MD   400 mg at 07/08/17 0941  . atorvastatin (LIPITOR) tablet 20 mg  20 mg Oral Daily Mariel Aloe, MD   20 mg at 07/08/17 0941  . carvedilol (COREG) tablet 3.125 mg  3.125 mg Oral BID WC Jettie Booze E, NP   3.125 mg at 07/08/17 0941  . Chlorhexidine Gluconate Cloth 2 % PADS 6  each  6 each Topical Daily Modena Jansky, MD   6 each at 07/07/17 1821  . [START ON 07/11/2017] Darbepoetin Alfa (ARANESP) injection 200 mcg  200 mcg Subcutaneous Q Sat-1800 Deterding, James, MD      . diphenhydrAMINE (BENADRYL) injection 25 mg  25 mg Intravenous Q6H PRN Donato Heinz, MD   25 mg at 07/05/17 0734  . folic acid (FOLVITE) tablet 1 mg  1 mg Oral Daily Mariel Aloe, MD   1 mg at 07/08/17 0941  . furosemide (LASIX) tablet 160 mg  160 mg Oral TID Mauricia Area, MD   160 mg at 07/08/17 0940  . heparin injection 1,000-6,000 Units  1,000-6,000 Units CRRT PRN Donato Heinz, MD   2,400 Units at 07/07/17 1710  . heparinized saline (2000 units/L) primer fluid for CRRT   CRRT PRN Donato Heinz, MD 999 mL/hr at 07/06/17 1340    . HYDROmorphone (DILAUDID) injection 0.5 mg  0.5 mg Intravenous Q4H PRN Hongalgi, Anand D, MD      . hydrOXYzine (ATARAX/VISTARIL) tablet 10 mg  10 mg Oral Q6H PRN Donato Heinz, MD   10 mg at 07/05/17 0813  . insulin aspart (novoLOG) injection 0-15 Units  0-15 Units Subcutaneous TID WC Larey Dresser, MD   2 Units at 07/08/17 1238  . insulin aspart (novoLOG) injection 0-5 Units  0-5 Units Subcutaneous QHS Mariel Aloe, MD   2 Units at 07/05/17 2143  . insulin aspart (novoLOG) injection  4 Units  4 Units Subcutaneous TID WC Shirley Friar, PA-C   4 Units at 07/08/17 1239  . insulin detemir (LEVEMIR) injection 10 Units  10 Units Subcutaneous Q2200 Mariel Aloe, MD   10 Units at 07/07/17 2153  . oxyCODONE (Oxy IR/ROXICODONE) immediate release tablet 5 mg  5 mg Oral Q4H PRN Modena Jansky, MD   5 mg at 06/30/17 1423  . pantoprazole (PROTONIX) EC tablet 40 mg  40 mg Oral Daily Mariel Aloe, MD   40 mg at 07/08/17 0941  . [START ON 07/09/2017] predniSONE (DELTASONE) tablet 10 mg  10 mg Oral Once Larey Dresser, MD      . Derrill Memo ON 07/10/2017] predniSONE (DELTASONE) tablet 5 mg  5 mg Oral Once Larey Dresser, MD      .  prismasol BGK 4/2.5 5,000 mL dialysis replacement fluid   CRRT Continuous Donato Heinz, MD 300 mL/hr at 07/07/17 0203    . prismasol BGK 4/2.5 5,000 mL dialysis replacement fluid   CRRT Continuous Donato Heinz, MD 300 mL/hr at 07/07/17 0245    . prismasol BGK 4/2.5 5,000 mL dialysis solution   CRRT Continuous Donato Heinz, MD 1,000 mL/hr at 07/07/17 1211    . sodium chloride (OCEAN) 0.65 % nasal spray 2 spray  2 spray Each Nare BID Modena Jansky, MD   2 spray at 07/06/17 2209  . sodium chloride flush (NS) 0.9 % injection 10-40 mL  10-40 mL Intracatheter Q12H Modena Jansky, MD   10 mL at 07/07/17 2154  . sodium chloride flush (NS) 0.9 % injection 10-40 mL  10-40 mL Intracatheter PRN Modena Jansky, MD   10 mL at 07/08/17 0355  . sodium chloride flush (NS) 0.9 % injection 3 mL  3 mL Intravenous Q12H Larey Dresser, MD   3 mL at 07/08/17 0946  . sodium chloride flush (NS) 0.9 % injection 3 mL  3 mL Intravenous PRN Larey Dresser, MD      . warfarin (COUMADIN) tablet 2.5 mg  2.5 mg Oral ONCE-1800 Carney, Gay Filler, RPH      . Warfarin - Pharmacist Dosing Inpatient   Does not apply q1800 Lyndee Leo, [place] at 07/02/17 1800    Pt meds include: Statin :Yes Betablocker: Yes ASA: No Other anticoagulants/antiplatelets: Coumadin INR 2.58  Past Medical History:  Diagnosis Date  . Anemia of chronic disease   . Asthma    said he does not know  . Chronic kidney disease (CKD), stage IV (severe) ([place])   . Chronic systolic CHF (congestive heart failure) (HCC)    a. prior EF normal; b. Echo 10/16: Mild LVH, EF 30-35%, mitral valve bioprosthesis present without evidence of stenosis or regurgitation, severe LAE, mild RVE with mild to moderately reduced RVSF, moderate RAE  . Coronary artery disease   . Diabetes mellitus type 2 in obese ([place])   . Diabetes mellitus without complication ([place])   . Dyspnea   . GERD (gastroesophageal reflux disease)   . H/O tricuspid  valve repair 2012  . History of echocardiogram    Echo [facility] 4/12: mod LVH, EF 60-65%, mod LAE, tissue MVR ok, mod TR, mod pulmo HTN with RVSP 48 mmHg  . Hypertension   . Obstructive sleep apnea   . Paroxysmal atrial fibrillation (HCC)    s/p Maze procedure at time of MVR in 2011  . Pulmonary HTN ([place])   . S/P mitral valve  replacement with porcine valve 2012    Past Surgical History:  Procedure Laterality Date  . CARDIAC SURGERY    . RIGHT HEART CATH N/A 06/29/2017   Procedure: RIGHT HEART CATH;  Surgeon: Larey Dresser, MD;  Location: [facility];  Service: Cardiovascular;  Laterality: N/A;    Social History Social History  Substance Use Topics  . Smoking status: Current Some Day Smoker    Packs/day: 1.00    Types: Cigarettes  . Smokeless tobacco: Former Systems developer     Comment: said he is not a regular smoker  . Alcohol use No    Family History Family History  Problem Relation Age of Onset  . Heart attack Neg Hx     No Known Allergies   REVIEW OF SYSTEMS  General: [ ]  Weight loss, [ ]  Fever, [ ]  chills Neurologic: [ ]  Dizziness, [ ]  Blackouts, [ ]  Seizure [ ]  Stroke, [ ]  "Mini stroke", [ ]  Slurred speech, [ ]  Temporary blindness; [ ]  weakness in arms or legs, [ ]  Hoarseness [ ]  Dysphagia Cardiac: [ ]  Chest pain/pressure, [ ]  Shortness of breath at rest [x ] Shortness of breath with exertion, [ ]  Atrial fibrillation or irregular heartbeat  Vascular: [ ]  Pain in legs with walking, [ ]  Pain in legs at rest, [ ]  Pain in legs at night,  [ ]  Non-healing ulcer, [ ]  Blood clot in vein/DVT,   Pulmonary: [ ]  Home oxygen, [ ]  Productive cough, [ ]  Coughing up blood, [ ]  Asthma,  [ ]  Wheezing [ ]  COPD Musculoskeletal:  [ ]  Arthritis, [ ]  Low back pain, [ ]  Joint pain Hematologic: [ ]  Easy Bruising, [ ]  Anemia; [ ]  Hepatitis Gastrointestinal: [ ]  Blood in stool, [ ]  Gastroesophageal Reflux/heartburn, Urinary: x[ ]  chronic Kidney disease, [x ] on HD - [ ]  MWF or  [ ]  TTHS, [ ]  Burning with urination, [ ]  Difficulty urinating Skin: [ ]  Rashes, [ ]  Wounds Psychological: [ ]  Anxiety, [ ]  Depression  Physical Examination Vitals:   07/08/17 0800 07/08/17 0941 07/08/17 1131 07/08/17 1132  BP:  98/65  97/66  Pulse:  90  86  Resp:  14  13  Temp: 98.4 F (36.9 C)  98.3 F (36.8 C)   TempSrc: Oral  Oral   SpO2:  100%  99%  Weight:      Height:       Body mass index is 25.28 kg/m.  General:  WDWN in NAD HENT: WNL Eyes: Pupils equal Pulmonary: normal non-labored breathing , without Rales, rhonchi,  wheezing Cardiac: A fib, without  Murmurs, rubs or gallops; No carotid bruits Abdomen: soft, NT, no masses Skin: no rashes, ulcers noted;  no Gangrene , no cellulitis; no open wounds;   Vascular Exam/Pulses:palpable radial pulses B,  He has IV in the left antecubital area.    Musculoskeletal: no muscle wasting or atrophy; no edema  Neurologic: A&O X 3; Appropriate Affect ;  SENSATION: normal; MOTOR FUNCTION: 5/5 Symmetric Speech is fluent/normal   Significant Diagnostic Studies: CBC Lab Results  Component Value Date   WBC 11.4 (H) 07/08/2017   HGB 8.7 (L) 07/08/2017   HCT 26.7 (L) 07/08/2017   MCV 76.5 (L) 07/08/2017   PLT 212 07/08/2017    BMET    Component Value Date/Time   NA 136 07/08/2017 0353   NA 140 03/30/2017 1406   K 4.4 07/08/2017 0353   CL 102 07/08/2017 0353   CO2 24  07/08/2017 0353   GLUCOSE 85 07/08/2017 0353   BUN 44 (H) 07/08/2017 0353   BUN 33 (H) 03/30/2017 1406   CREATININE 3.02 (H) 07/08/2017 0353   CREATININE 1.61 (H) 01/16/2017 1653   CALCIUM 9.0 07/08/2017 0353   GFRNONAA 21 (L) 07/08/2017 0353   GFRNONAA 47 (L) 01/16/2017 1653   GFRAA 25 (L) 07/08/2017 0353   GFRAA 54 (L) 01/16/2017 1653   Estimated Creatinine Clearance: 23.5 mL/min (A) (by C-G formula based on SCr of 3.02 mg/dL (H)).  COAG Lab Results  Component Value Date   INR 2.26 07/08/2017   INR 2.58 07/07/2017   INR 2.39 07/06/2017      Non-Invasive Vascular Imaging: vein mapping pending  ASSESSMENT/PLAN:  AKI/CKD treated with RRT.   Per notes in chart: AKI on CKD stage IV: Now getting RRT and off milrinone.  Also on lasix 160 mg three times a day. Making a small amount of urine but not vigorous => suspect he is going to be dependent on HD.  Long-term plan remains quite challenging. He has tolerated CVVH so may do ok with IHD though long-term will be difficult with RV failure.  He lives alone and doesn't have much help with his healthcare so chance at heart-kidney transplant would be very low.  - Trial IHD today.  Vein mapping has been ordered.  He is right hand dominant.  Plan for tunneled HD catheter and fistula verse graft.  He will need to hold his coumadin to allow his INR to decrease to < 1.8 prior to surgery.      Laurence Slate [facility] 07/08/2017 2:34 PM   History and exam findings as above.  Cardiac/renal failure.  He is right handed so most likely will get left arm access.  Will await vein mapping and correction of INR access most likely early next week.  Ruta Hinds, MD [facility] Office: [phone] Pager: [phone]

## 2017-07-08 NOTE — Progress Notes (Addendum)
Daily Progress Note   Patient Name: Michael [name]       Date: 07/08/2017 DOB: 1961/08/07  Age: 56 y.o. MRN#: [id number] Attending Physician: Larey Dresser, MD Primary Care Physician: [name], MD Admit Date: 06/25/2017  Reason for Consultation/Follow-up: Establishing goals of care and Psychosocial/spiritual support  Subjective: Michael [name] is more withdrawn today. He expresses strong fear about starting dialysis, and understands it will likely be lifelong. He is concerned about being able to tolerate the process itself, as well as being safe at home with effectively no social support. He has seen others go through dialysis and is aware of how tired he will likely be--and how it will impact his functional ability. He feels "normal" right now with no complaints, but feels like this time of feeling good will be short lived as he prepares for dialysis later today.   Length of Stay: 11  Current Medications: Scheduled Meds:  . amiodarone  400 mg Oral BID  . atorvastatin  20 mg Oral Daily  . carvedilol  3.125 mg Oral BID WC  . Chlorhexidine Gluconate Cloth  6 each Topical Daily  . [START ON 07/11/2017] darbepoetin (ARANESP) injection - NON-DIALYSIS  200 mcg Subcutaneous Q Sat-1800  . folic acid  1 mg Oral Daily  . furosemide  160 mg Oral TID  . insulin aspart  0-15 Units Subcutaneous TID WC  . insulin aspart  0-5 Units Subcutaneous QHS  . insulin aspart  4 Units Subcutaneous TID WC  . insulin detemir  10 Units Subcutaneous Q2200  . pantoprazole  40 mg Oral Daily  . [START ON 07/09/2017] predniSONE  10 mg Oral Once  . [START ON 07/10/2017] predniSONE  5 mg Oral Once  . sodium chloride  2 spray Each Nare BID  . sodium chloride flush  10-40 mL Intracatheter Q12H  . sodium chloride flush  3 mL Intravenous Q12H  . warfarin  2.5 mg Oral  ONCE-1800  . Warfarin - Pharmacist Dosing Inpatient   Does not apply q1800    Continuous Infusions: . sodium chloride Stopped (07/01/17 1500)  . sodium chloride Stopped (07/06/17 0814)  . heparin 999 mL/hr at 07/06/17 1340  . dialysis replacement fluid (prismasate) 300 mL/hr at 07/07/17 0203  . dialysis replacement fluid (prismasate) 300 mL/hr at 07/07/17 0245  . dialysate (PRISMASATE) 1,000 mL/hr at 07/07/17 1211    PRN Meds: sodium chloride, acetaminophen, diphenhydrAMINE, heparin, heparin, HYDROmorphone (DILAUDID) injection, hydrOXYzine, oxyCODONE, sodium chloride flush, sodium chloride flush  Physical Exam  Constitutional: He is oriented to person, place, and time. He appears well-developed and well-nourished.  Anxious and tearful. HENT:  Head: Normocephalic and atraumatic.  Mouth/Throat: Oropharynx is clear and moist. No oropharyngeal exudate.  Eyes: EOM are normal.  Neck:  R IJ catheter  Pulmonary/Chest: Effort normal. No respiratory distress. He has decreased breath sounds in the right lower field and the left lower field.  Abdominal: Soft. Bowel sounds are normal. He exhibits distension (unchanged from yesterday) Musculoskeletal: Normal range of motion. He exhibits no edema.  Neurological: He is alert and oriented to person, place, and time.  Skin: Skin is warm and dry.  Psychiatric: Judgment and thought content normal.  More withdrawn today and tearful. Clearly afraid and anxious.     Vital Signs: BP 97/66 (BP Location: Right Arm)   Pulse 86   Temp 98.3 F (36.8 C) (Oral)   Resp 13   Ht _0  (1.651 m)   Wt 68.9 kg (151 lb 14.4 oz)   SpO2 99%   BMI 25.28 kg/m  SpO2: SpO2: 99 % O2 Device: O2 Device: Not Delivered O2 Flow Rate: O2 Flow Rate (L/min): 2 L/min  Intake/output summary:   Intake/Output Summary (Last 24 hours) at 07/08/17 1405 Last data filed at 07/08/17 1100  Gross per 24 hour  Intake           775.17 ml  Output              968 ml  Net           -192.83 ml   LBM: Last BM Date: 07/07/17 Baseline Weight: Weight: 91.2 kg (201 lb 1 oz) Most recent weight: Weight: 68.9 kg (151 lb 14.4 oz)      Palliative Assessment/Data: PPS 50%    Patient Active Problem List   Diagnosis Date Noted  . Goals of care, counseling/discussion   . Palliative care by specialist   . Acute on chronic combined systolic and diastolic CHF (congestive heart failure) ([place]) 06/27/2017  . Acute kidney injury ([place]) 06/25/2017  . Anasarca 05/30/2017  . Acute exacerbation of CHF (congestive heart failure) ([place]) 05/30/2017  . HLD (hyperlipidemia) 10/05/2016  . Mouth bleeding 10/05/2016  . Bilateral knee pain 10/05/2016  . Petechial rash 10/05/2016  . Laceration of tongue   . Insomnia 06/10/2016  . Supratherapeutic INR 06/01/2016  . Acute hepatic encephalopathy 03/15/2016  . Liver cirrhosis ([place]) 03/15/2016  . Volume overload 03/15/2016  . Acute congestive heart failure ([place])   . Insulin dependent diabetes mellitus ([place])   . Acute renal failure (ARF) ([place]) 03/14/2016  . GERD (gastroesophageal reflux disease) 03/04/2016  . Aortic regurgitation 02/27/2016  . Viral gastroenteritis 02/27/2016  . A-fib ([place]) 02/26/2016  . Abdominal pain 02/25/2016  . Diabetes mellitus with complication ([place]) 65/01/5464  . Chronic atrial fibrillation with RVR ([place]) 02/25/2016  . Acute on chronic systolic CHF (congestive heart failure) ([place]) 02/25/2016  . Anemia, chronic disease 02/25/2016  . Atrial fibrillation with RVR ([place]) 02/19/2016  . Pain in joint, ankle and foot 01/10/2016  . Asthma   . Acute hypoxemic respiratory failure ([place]) 12/29/2015  . Acute respiratory failure ([place]) 12/29/2015  . Chronic atrial fibrillation ([place]) 12/24/2015  . Dilated cardiomyopathy ([place]) 12/24/2015  . Polypharmacy 11/23/2015  . Gout 10/23/2015  . Encounter for therapeutic drug monitoring 10/19/2015  . Anemia - mild exacerbation 10/15/2015  . Chronic systolic CHF (congestive heart  failure) ([place]) 10/09/2015  . Tricuspid valve disease 10/09/2015  . Mitral valve disease   . CKD (chronic kidney disease), stage IV ([place]) 09/10/2015  . Calf pain 09/10/2015  . DM type 2 (diabetes mellitus, type 2) ([place]) 09/10/2015  . Essential hypertension 09/10/2015    Palliative Care Assessment & Plan   HPI: 56 y.o. 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 Fib, CKD stage IV, and DM2. He presented with significant edema secondary to acute on chronic dCHF. He was admitted on 06/25/2017. He was initially started on Milrinone, then that was stopped once he started CVVHD. Now down 54lb. Plan to stop CVVHD within the next 24 hours  and monitor for renal recovery. Poor heart/kidney transplant candidate due to minimal social support. If renal function does not recover options are for comfort care versus iHD. Given his degree of RV failure unlikely to tolerate iHD for long. Palliative consulted to assist in goals of care.   Assessment: On our initial meeting we had a hard conversation about his health issues and what the future might look like. He expressed the desire to "do everything needed to live," which I confirmed meant that he would want dialysis if his kidneys did not recover, and he would want all aggressive measures taken to prolong his life (full code status). He is not yet at the point to consider comfort care only if there is anything else that can be tried.  Today he is clearly very anxious and upset about having to start dialysis. He had hoped that his kidney function would recover, and his likely dependence on HD has been a bit overwhelming today. He is also very afraid about what the future might hold. He is concerned about his heart's ability to tolerate dialysis, as well as the impact it will have on his life. He is not sure how he is going to manage at home: getting to and from appointments,  cooking and cleaning, etc. as he expects he will be very tired/worn out and does not have any help here.   In talking through his concerns he asked about the option of relocating to [place] to be closer to extended family. I shared the process for that and reinforced the importance of talking with his kidney and heart doctors about his plan to ensure a seamless transition of care to providers out there. Additionally, he had a lot of questions about the possibility of transplant. I spent a great deal of time explaining the requirement of good social support, as well as the reality that he may be on a wait-list for a period of time (he expected a transplant immediately if he could have family fly here). As he has no support here, my best recommendation would be to relocate to [place] where he could have 24hr care provided by family. Once there he could be evaluated for transplant. At this point, however, he needs to start dialysis and ensure stability and tolerance.   Finally, I did have a chance to speak with his daughter on the phone. She was on speaker phone with her father able to hear our conversation. She had a decent grasp on her father's health issues, and I clarified the current plan with iHD. She was receptive to this information but reinforced that she was not in a position to provide any home support.   Recommendations/Plan:  Full code, full scope aggressive care. Pt agreeing to dialysis, however very fearful  Noted [facility] involvement. Message left with hospital liaison [name]) to discuss community resources. Will need a clear plan to ensure transportation needs are met and consideration of other services, such as Meals on Wheels  Addendum: Spoke with [place]. She will speak with her program's SW regarding meals (such as Meals on Wheels) and f/u transportation. Since he is admitted, our SW can help fill out the [name] application. This highly subsidized program provides $3 round-trip rides from the  patients house to dialysis and back. Consult placed for SW to assist pt in filling out and submitting the application.   Goals of Care and Additional Recommendations:  Limitations on Scope of Treatment: Full Scope Treatment  Code Status:  Full code  Prognosis:   Unable to determine  Discharge Planning:  Home with [place] was discussed with pt and daughter.  Thank you for allowing the Palliative Medicine Team to assist in the care of this patient.  Time in: 1300 Time out: 1345 Total time: 70 minutes Greater than 50%  of this time was spent counseling and coordinating care related to the above assessment and plan.  Charlynn Court, NP Palliative Medicine Team [phone] pager (7a-5p) Team Phone # [phone]

## 2017-07-08 NOTE — Consult Note (Signed)
   [facility] CM Inpatient Consult   07/08/2017  Javeon Freund Mar 18, 1961 [id number]   Received call from inpatient RNCM. Mr. Dotzler has been transferred out of the ICU. Confirmed that [facility] is following patient. Also discussed [facility] LCSW referral for transportation needs.   Discussed that Probation officer plans on following up with Mr. Kretschmer at bedside as well.     [name], MSN-Ed, RN,BSN [facility] Liaison [phone]

## 2017-07-08 NOTE — Progress Notes (Signed)
[place] for warfarin  Indication: atrial fibrillation  No Known Allergies  Labs:  Recent Labs  07/06/17 0336  07/07/17 0330 07/07/17 0755 07/07/17 1722 07/08/17 0338 07/08/17 0353  HGB 8.3*  --   --  8.8*  --  8.7*  --   HCT 26.7*  --   --  26.3*  --  26.7*  --   PLT 224  --   --  228  --  212  --   LABPROT 26.5*  --  28.2*  --   --  25.3*  --   INR 2.39  --  2.58  --   --  2.26  --   CREATININE 2.22*  < > 2.43*  --  2.18*  --  3.02*  < > = values in this interval not displayed.  Estimated Creatinine Clearance: 23.5 mL/min (A) (by C-G formula based on SCr of 3.02 mg/dL (H)).   Assessment: Pt on warfarin PTA for afib/MVR (bioprosthetic).  Hgb down slightly to 8.7, s/p PRBCs 8/14 INR this am is in range at 2.26. CBC low, stable  PTA warfarin dose was 2.5 mg daily  Goal of Therapy:  INR 2-3  Monitor platelets by anticoagulation protocol: Yes   Plan:  -Warfarin 2 mg x 1 tonight. -Daily INR for now -Planning cardioversion tomorrow.  Uvaldo Rising, BCPS  Clinical Pharmacist Pager [phone]  07/08/2017 12:16 PM

## 2017-07-08 NOTE — Progress Notes (Signed)
Subjective: Interval History: has no complaint.  Objective: Vital signs in last 24 hours: Temp:  [98 F (36.7 C)-98.2 F (36.8 C)] 98.1 F (36.7 C) (08/22 0300) Pulse Rate:  [35-105] 87 (08/22 0300) Resp:  [12-27] 13 (08/22 0300) BP: (81-117)/(52-82) 96/60 (08/22 0300) SpO2:  [91 %-100 %] 100 % (08/22 0300) Weight:  [68.9 kg (151 lb 14.4 oz)] 68.9 kg (151 lb 14.4 oz) (08/22 0300) Weight change: 1.7 kg (3 lb 12 oz)  Intake/Output from previous day: 08/21 0701 - 08/22 0700 In: 1340.2 [P.O.:1340; I.V.:0.2] Out: 2123 [Urine:425] Intake/Output this shift: No intake/output data recorded.  General appearance: alert, cooperative and no distress Neck: IJ cath Resp: clear to auscultation bilaterally Cardio: irregularly irregular rhythm and systolic murmur: holosystolic 2/6, blowing at apex GI: liver down 5 cm pos bs. soft Extremities: extremities normal, atraumatic, no cyanosis or edema  Lab Results:  Recent Labs  07/07/17 0755 07/08/17 0338  WBC 11.9* 11.4*  HGB 8.8* 8.7*  HCT 26.3* 26.7*  PLT 228 212   BMET:  Recent Labs  07/07/17 1722 07/08/17 0353  NA 134* 136  K 4.9 4.4  CL 100* 102  CO2 25 24  GLUCOSE 178* 85  BUN 31* 44*  CREATININE 2.18* 3.02*  CALCIUM 8.8* 9.0   No results for input(s): PTH in the last 72 hours. Iron Studies: No results for input(s): IRON, TIBC, TRANSFERRIN, FERRITIN in the last 72 hours.  Studies/Results: No results found.  I have reviewed the patient's current medications.  Assessment/Plan: 1 CKD/AKI suspect ESRD, making some urine but not a lot.  Will do IHD today as plan to CV tomorrow. 2 CM 3 anemia esa, Fe 4 Afib for CV 5 CM controllled P IHD, CV, esa, will get PC/perm access soon    LOS: 11 days   Kellyn Mccary L 07/08/2017,7:59 AM

## 2017-07-08 NOTE — Care Management Note (Signed)
Case Management Note Previous Note Created by Tomi Bamberger 07/02/17  Patient Details  Name: Mylez Venable MRN: [id number] Date of Birth: 01/22/1961  Subjective/Objective:    From home alone, hea has a daughter who works and goes to school every day, Patient is established with [facility] Sidles will reschedule patient's follow up appt when he is near discharge. He is also active with [facility], and HHPT, also will be followed by [facility].  He has Acute /Chronic CHF, AKI, CKD IV, afib,  Anemia, bioprosthetic MV.  CVP remains elevated , HD cath place 8/14 and CRRT started for volume removal.  Plan for TEE and DCCV when volume is better per MD  Note.      8/16 [place], BSN - NCM spoke with patient and informed him that he will be with [facility] for [facility] , he states that is fine ,Bethena Roys is aware. He will need daily weights and if needed iv lasix at home, Per MD they do give iv lasix thru [facility] agency at home.  He conts to receive CRRT today                Action/Plan:   Expected Discharge Date:                  Expected Discharge Plan:  [place]  In-House Referral:  NA  Discharge planning Services  CM Consult, Follow-up appt scheduled  Post Acute Care Choice:  Home Health Choice offered to:  Patient  DME Arranged:    DME Agency:     HH Arranged:  RN, PT [place] Agency:  [place]  Status of Service:  In process, will continue to follow  If discussed at Long Length of Stay Meetings, dates discussed:    Additional Comments: 07/08/2017 Pt transferred from [facility].  Pt came of CRRT 07/06/17 - and will now start on IHD - daily assessments as pt is new to HD.  CM confirmed with HD unit that clipping has not started yet due to uncertainty of HD long term.  Pt is active with [facility] program and both [facility] and [facility] following.  CM requested [facility] to assist with transportation to appts - [facility] CM will also consult with [facility] SW for  community resources including transportation.  Palliative following for [name], RN 07/08/2017, 11:40 AM

## 2017-07-09 ENCOUNTER — Inpatient Hospital Stay (HOSPITAL_COMMUNITY): Payer: Medicare Other | Admitting: Certified Registered Nurse Anesthetist

## 2017-07-09 ENCOUNTER — Encounter (HOSPITAL_COMMUNITY)
Admission: EM | Disposition: A | Discharge status: Home Health | Payer: Self-pay | Source: Home / Self Care | Attending: Cardiology

## 2017-07-09 ENCOUNTER — Inpatient Hospital Stay (HOSPITAL_COMMUNITY): Payer: Medicare Other

## 2017-07-09 ENCOUNTER — Ambulatory Visit (HOSPITAL_COMMUNITY): Payer: Medicare Other

## 2017-07-09 ENCOUNTER — Encounter (HOSPITAL_COMMUNITY): Payer: Self-pay | Admitting: Certified Registered Nurse Anesthetist

## 2017-07-09 DIAGNOSIS — I4891 Unspecified atrial fibrillation: Secondary | ICD-10-CM

## 2017-07-09 DIAGNOSIS — Z01810 Encounter for preprocedural cardiovascular examination: Secondary | ICD-10-CM

## 2017-07-09 DIAGNOSIS — I351 Nonrheumatic aortic (valve) insufficiency: Secondary | ICD-10-CM

## 2017-07-09 HISTORY — PX: CARDIOVERSION: SHX1299

## 2017-07-09 HISTORY — PX: TEE WITHOUT CARDIOVERSION: SHX5443

## 2017-07-09 LAB — CBC
HCT: 26.8 % — ABNORMAL LOW (ref 39.0–52.0)
Hemoglobin: 8.8 g/dL — ABNORMAL LOW (ref 13.0–17.0)
MCH: 24.7 pg — ABNORMAL LOW (ref 26.0–34.0)
MCHC: 32.8 g/dL (ref 30.0–36.0)
MCV: 75.3 fL — ABNORMAL LOW (ref 78.0–100.0)
Platelets: 216 10*3/uL (ref 150–400)
RBC: 3.56 MIL/uL — ABNORMAL LOW (ref 4.22–5.81)
RDW: 18.9 % — AB (ref 11.5–15.5)
WBC: 10.9 10*3/uL — ABNORMAL HIGH (ref 4.0–10.5)

## 2017-07-09 LAB — RENAL FUNCTION PANEL
ALBUMIN: 3.3 g/dL — AB (ref 3.5–5.0)
Anion gap: 9 (ref 5–15)
BUN: 12 mg/dL (ref 6–20)
CALCIUM: 8.7 mg/dL — AB (ref 8.9–10.3)
CO2: 29 mmol/L (ref 22–32)
Chloride: 99 mmol/L — ABNORMAL LOW (ref 101–111)
Creatinine, Ser: 1.44 mg/dL — ABNORMAL HIGH (ref 0.61–1.24)
GFR calc Af Amer: >60 mL/min (ref >60)
GFR calc non Af Amer: 53 mL/min — ABNORMAL LOW (ref >60)
GLUCOSE: 70 mg/dL (ref 65–99)
PHOSPHORUS: 2.8 mg/dL (ref 2.5–4.6)
POTASSIUM: 3.3 mmol/L — AB (ref 3.5–5.1)
SODIUM: 137 mmol/L (ref 135–145)

## 2017-07-09 LAB — PROTIME-INR
INR: 1.97
Prothrombin Time: 22.7 seconds — ABNORMAL HIGH (ref 11.4–15.2)

## 2017-07-09 LAB — HEPARIN LEVEL (UNFRACTIONATED): HEPARIN UNFRACTIONATED: 0.14 [IU]/mL — AB (ref 0.30–0.70)

## 2017-07-09 LAB — GLUCOSE, CAPILLARY
GLUCOSE-CAPILLARY: 74 mg/dL (ref 65–99)
GLUCOSE-CAPILLARY: 94 mg/dL (ref 65–99)
GLUCOSE-CAPILLARY: 132 mg/dL — AB (ref 65–99)
GLUCOSE-CAPILLARY: 122 mg/dL — AB (ref 65–99)
Glucose-Capillary: 76 mg/dL (ref 65–99)
Glucose-Capillary: 92 mg/dL (ref 65–99)

## 2017-07-09 LAB — MAGNESIUM: MAGNESIUM: 2.1 mg/dL (ref 1.7–2.4)

## 2017-07-09 SURGERY — ECHOCARDIOGRAM, TRANSESOPHAGEAL
Anesthesia: General

## 2017-07-09 MED ORDER — PROPOFOL 500 MG/50ML IV EMUL
INTRAVENOUS | Status: DC | PRN
  Administered 2017-07-09: 100 ug/kg/min via INTRAVENOUS

## 2017-07-09 MED ORDER — BUTAMBEN-TETRACAINE-BENZOCAINE 2-2-14 % EX AERO
INHALATION_SPRAY | CUTANEOUS | Status: DC | PRN
Start: 2017-07-09 — End: 2017-07-09
  Administered 2017-07-09: 1020 via TOPICAL

## 2017-07-09 MED ORDER — SODIUM CHLORIDE 0.9 % IV SOLN: 100.0000 mL | INTRAVENOUS | Status: DC | PRN

## 2017-07-09 MED ORDER — ALTEPLASE 2 MG IJ SOLR: 2.0000 mg | Freq: Once | INTRAMUSCULAR | Status: DC | PRN

## 2017-07-09 MED ORDER — HEPARIN (PORCINE) IN NACL 100-0.45 UNIT/ML-% IJ SOLN
1350.0000 [IU]/h | INTRAMUSCULAR | Status: DC
  Filled 2017-07-09: qty 250

## 2017-07-09 MED ORDER — PROPOFOL 10 MG/ML IV BOLUS
INTRAVENOUS | Status: DC | PRN
  Administered 2017-07-09 (×3): 10 mg via INTRAVENOUS

## 2017-07-09 MED ORDER — SODIUM CHLORIDE 0.9% FLUSH
3.0000 mL | Freq: Two times a day (BID) | INTRAVENOUS | Status: DC
  Administered 2017-07-10: 3 mL via INTRAVENOUS

## 2017-07-09 MED ORDER — SODIUM CHLORIDE 0.9 % IV SOLN: 250.0000 mL | INTRAVENOUS | Status: DC

## 2017-07-09 MED ORDER — PHENYLEPHRINE 40 MCG/ML (10ML) SYRINGE FOR IV PUSH (FOR BLOOD PRESSURE SUPPORT)
PREFILLED_SYRINGE | INTRAVENOUS | Status: DC | PRN
  Administered 2017-07-09: 120 ug via INTRAVENOUS
  Administered 2017-07-09: 80 ug via INTRAVENOUS
  Administered 2017-07-09 (×2): 120 ug via INTRAVENOUS
  Administered 2017-07-09 (×2): 80 ug via INTRAVENOUS
  Administered 2017-07-09: 120 ug via INTRAVENOUS
  Administered 2017-07-09: 80 ug via INTRAVENOUS

## 2017-07-09 MED ORDER — HEPARIN SODIUM (PORCINE) 1000 UNIT/ML DIALYSIS
1000.0000 [IU] | INTRAMUSCULAR | Status: DC | PRN
  Administered 2017-07-09: 1000 [IU] via INTRAVENOUS_CENTRAL

## 2017-07-09 MED ORDER — HEPARIN (PORCINE) IN NACL 100-0.45 UNIT/ML-% IJ SOLN
1000.0000 [IU]/h | INTRAMUSCULAR | Status: DC
  Administered 2017-07-09: 1000 [IU]/h via INTRAVENOUS
  Filled 2017-07-09: qty 250

## 2017-07-09 MED ORDER — POTASSIUM CHLORIDE CRYS ER 20 MEQ PO TBCR
20.0000 meq | EXTENDED_RELEASE_TABLET | Freq: Once | ORAL | Status: AC
Start: 2017-07-09 — End: 2017-07-09
  Administered 2017-07-09: 20 meq via ORAL
  Filled 2017-07-09: qty 1

## 2017-07-09 MED ORDER — SODIUM CHLORIDE 0.9 % IV SOLN
100.0000 mL | INTRAVENOUS | Status: DC | PRN
  Administered 2017-07-09: 10:00:00 via INTRAVENOUS

## 2017-07-09 MED ORDER — SODIUM CHLORIDE 0.9% FLUSH: 3.0000 mL | INTRAVENOUS | Status: DC | PRN

## 2017-07-09 NOTE — Progress Notes (Signed)
Subjective: Interval History: has no complaint .  Objective: Vital signs in last 24 hours: Temp:  [98 F (36.7 C)-98.8 F (37.1 C)] 98.1 F (36.7 C) (08/23 0429) Pulse Rate:  [72-163] 86 (08/23 0320) Resp:  [13-28] 16 (08/23 0320) BP: (87-118)/(54-76) 93/57 (08/23 0320) SpO2:  [96 %-100 %] 100 % (08/23 0320) Weight:  [68.1 kg (150 lb 2.1 oz)-69.1 kg (152 lb 5.4 oz)] 68.1 kg (150 lb 2.1 oz) (08/23 0320) Weight change: 0.2 kg (7.1 oz)  Intake/Output from previous day: 08/22 0701 - 08/23 0700 In: 910 [P.O.:910] Out: 1540 [Urine:540] Intake/Output this shift: No intake/output data recorded.  General appearance: alert, cooperative and no distress Neck: RIJ cath Resp: RIJ cath Cardio: irregularly irregular rhythm and systolic murmur: holosystolic 2/6, blowing at apex GI: soft, liver down 4 cm, pos bs Extremities: extremities normal, atraumatic, no cyanosis or edema  Lab Results:  Recent Labs  07/08/17 0338 07/09/17 0443  WBC 11.4* 10.9*  HGB 8.7* 8.8*  HCT 26.7* 26.8*  PLT 212 216   BMET:  Recent Labs  07/08/17 1540 07/09/17 0443  NA 131* 137  K 5.0 3.3*  CL 99* 99*  CO2 22 29  GLUCOSE 123* 70  BUN 55* 12  CREATININE 3.57* 1.44*  CALCIUM 8.7* 8.7*   No results for input(s): PTH in the last 72 hours. Iron Studies: No results for input(s): IRON, TIBC, TRANSFERRIN, FERRITIN in the last 72 hours.  Studies/Results: No results found.  I have reviewed the patient's current medications.  Assessment/Plan: 1 ESRD some urine,  Had HD yest. Lower bp , vol ok.  Plan HD in am.  Access next week 2 Anemia esa, Fe 3 DM controlled 4 Afib per Cards 5 CM  P HD, esa, K, access    LOS: 12 days   Terance Pomplun L 07/09/2017,7:16 AM

## 2017-07-09 NOTE — Progress Notes (Signed)
HD tx completed @ [id number] w/o problem other than intermittent low bp w/ pt being asymptomatic, UF goal still met, blood rinsed back, report called to Wendie Agreste, RN

## 2017-07-09 NOTE — Progress Notes (Addendum)
Physical Therapy Treatment Patient Details Name: Michael [name] MRN: [id number] DOB: 06-27-1961 Today's Date: 07/09/2017    History of Present Illness HPI: Michael [name] is a 56 y.o. male with medical history significant of anemia of chronic disease, chronic kidney disease, systolic heart failure, diabetes mellitus, GERD, history of tricuspid valve repair, essential hypertension, atrial fibrillation, pulmonary hypertension. Patient presented from his PCPs office for evaluation of anasarca. Acute kidney injury as well with cardiorenal syndrome.     PT Comments    Patient is progressing very well with mobility and tolerated ambulating 329ft. Pt is overall min guard/supervision for mobility. VSS throughout session on RA. Current plan remains appropriate.   Follow Up Recommendations  Home health PT     Equipment Recommendations  Other (comment) (Will consider RW versus cane versus none)    Recommendations for Other Services       Precautions / Restrictions Precautions Precautions: Fall Restrictions Weight Bearing Restrictions: No    Mobility  Bed Mobility               General bed mobility comments: pt OOB in chair upon arrival  Transfers Overall transfer level: Needs assistance Equipment used: None Transfers: Sit to/from Stand Sit to Stand: Min guard         General transfer comment: min guard for safety  Ambulation/Gait Ambulation/Gait assistance: Min guard Ambulation Distance (Feet): 300 Feet Assistive device: None Gait Pattern/deviations: Step-through pattern;Decreased stride length Gait velocity: decreased    General Gait Details: min guard for safety; no unsteadiness noted with horizontal/vertical head turns   Financial trader Rankin (Stroke Patients Only)       Balance Overall balance assessment: Needs assistance Sitting-balance support: No upper extremity supported;Feet supported Sitting  balance-Leahy Scale: Good     Standing balance support: No upper extremity supported;During functional activity Standing balance-Leahy Scale: Good                 High Level Balance Comments: pt maintained balance with min guard for safety but no physical assist when performing head turns, directional changes, tandem stance R/L, and [place]            Cognition Arousal/Alertness: Awake/alert Behavior During Therapy: WFL for tasks assessed/performed Overall Cognitive Status: Within Functional Limits for tasks assessed                                        Exercises General Exercises - Lower Extremity Hip Flexion/Marching:  (20 reps standing + 20 reps seated)    General Comments General comments (skin integrity, edema, etc.): VSS on RA      Pertinent Vitals/Pain Pain Assessment: No/denies pain    Home Living                      Prior Function            PT Goals (current goals can now be found in the care plan section) Acute Rehab PT Goals Patient Stated Goal: walk PT Goal Formulation: With patient Time For Goal Achievement: 07/16/17 Potential to Achieve Goals: Good Progress towards PT goals: Progressing toward goals    Frequency    Min 3X/week      PT Plan Current plan remains appropriate    Co-evaluation  AM-PAC PT "6 Clicks" Daily Activity  Outcome Measure  Difficulty turning over in bed (including adjusting bedclothes, sheets and blankets)?: None Difficulty moving from lying on back to sitting on the side of the bed? : None Difficulty sitting down on and standing up from a chair with arms (e.g., wheelchair, bedside commode, etc,.)?: A Little Help needed moving to and from a bed to chair (including a wheelchair)?: A Little Help needed walking in hospital room?: None Help needed climbing 3-5 steps with a railing? : A Little 6 Click Score: 21    End of Session Equipment Utilized During Treatment:  Gait belt Activity Tolerance: Patient tolerated treatment well Patient left: with call bell/phone within reach;in chair Nurse Communication: Mobility status PT Visit Diagnosis: Unsteadiness on feet (R26.81);Muscle weakness (generalized) (M62.81)     Time: 7017-7939 PT Time Calculation (min) (ACUTE ONLY): 19 min  Charges:  $Gait Training: 8-22 mins                    G Codes:       Earney Navy, PTA Pager: [phone]     Darliss Cheney 07/09/2017, 4:04 PM

## 2017-07-09 NOTE — Transfer of Care (Signed)
Immediate Anesthesia Transfer of Care Note  Patient: Treon Martinson  Procedure(s) Performed: Procedure(s): TRANSESOPHAGEAL ECHOCARDIOGRAM (TEE) (N/A) CARDIOVERSION (N/A)  Patient Location: Endoscopy Unit  Anesthesia Type:MAC  Level of Consciousness: awake and alert   Airway & Oxygen Therapy: Patient Spontanous Breathing and Patient connected to nasal cannula oxygen  Post-op Assessment: Report given to RN, Post -op Vital signs reviewed and stable and Patient moving all extremities X 4  Post vital signs: Reviewed and stable  Last Vitals:  Vitals:   07/09/17 0748 07/09/17 0927  BP: 91/66 (!) 95/51  Pulse: 81   Resp: (!) 22 12  Temp: 36.7 C 37.1 C  SpO2: 100% 100%    Last Pain:  Vitals:   07/09/17 0955  TempSrc:   PainSc: 0-No pain      Patients Stated Pain Goal: 0 (17/61/60 7371)  Complications: No apparent anesthesia complications

## 2017-07-09 NOTE — H&P (View-Only) (Signed)
Patient ID: Michael [name], male   DOB: 12/06/1960, 56 y.o.   MRN: [id number]     Advanced Heart Failure Rounding Note  Primary Cardiologist: Dr. Harrington Challenger HF Cardiology: Aundra Dubin  Subjective:    Started CVVHD 8/15 and stopped milrinone. CVVH stopped 8/21. Had HD on 8/22, tolerated reasonably well.    Remains in rate-controlled atrial fibrillation.    Studies: Echo (7/18): EF 50-55%, mild LVH, mild AI, bioprosthetic mitral valve appeared to function normally, RV moderately dilated with moderately decreased systolic function, D-shaped interventricular septum suggestive of RV pressure/volume overload, severe TR, PASP 54 mmHg  [facility] Procedural Findings: Hemodynamics (mmHg) RA mean 27 RV 63/8, mean 20 PA 58/28, mean 40 PCWP mean 28 Oxygen saturations: PA 69% AO 96% Cardiac Output (Fick) 9.82  Cardiac Index (Fick) 4.96 PVR 1.2 WU  Objective:   Weight Range: 150 lb 2.1 oz (68.1 kg) Body mass index is 24.98 kg/m.   Vital Signs:   Temp:  [98 F (36.7 C)-98.8 F (37.1 C)] 98.1 F (36.7 C) (08/23 0748) Pulse Rate:  [72-163] 81 (08/23 0748) Resp:  [13-28] 22 (08/23 0748) BP: (87-118)/(54-76) 91/66 (08/23 0748) SpO2:  [96 %-100 %] 100 % (08/23 0748) Weight:  [150 lb 2.1 oz (68.1 kg)-152 lb 5.4 oz (69.1 kg)] 150 lb 2.1 oz (68.1 kg) (08/23 0320) Last BM Date: 07/08/17  Weight change: Filed Weights   07/08/17 0300 07/08/17 2302 07/09/17 0320  Weight: 151 lb 14.4 oz (68.9 kg) 152 lb 5.4 oz (69.1 kg) 150 lb 2.1 oz (68.1 kg)    Intake/Output:   Intake/Output Summary (Last 24 hours) at 07/09/17 0819 Last data filed at 07/09/17 0313  Gross per 24 hour  Intake              910 ml  Output             1540 ml  Net             -630 ml      Physical Exam   General: Well appearing. No resp difficulty. In bed  HEENT: normal Neck: supple. JVP 12 cm. RIJ HD catheter. Carotids 2+ bilat; no bruits. No lymphadenopathy or thryomegaly appreciated. Cor: PMI nondisplaced. Irregular rate  &rhythm. + RV lift. No rubs, gallops or murmurs. Lungs: clear Abdomen: soft, nontender, nondistended. No hepatosplenomegaly. No bruits or masses. Good bowel sounds. Extremities: no cyanosis, clubbing, rash, edema Neuro: alert & orientedx3, cranial nerves grossly intact. moves all 4 extremities w/o difficulty. Affect pleasant  Telemetry  Personally reviewed. Atrial fibrillation rate 70s.    Labs    CBC  Recent Labs  07/08/17 0338 07/09/17 0443  WBC 11.4* 10.9*  HGB 8.7* 8.8*  HCT 26.7* 26.8*  MCV 76.5* 75.3*  PLT 212 528   Basic Metabolic Panel  Recent Labs  07/08/17 0338  07/08/17 1540 07/09/17 0443  NA  --   < > 131* 137  K  --   < > 5.0 3.3*  CL  --   < > 99* 99*  CO2  --   < > 22 29  GLUCOSE  --   < > 123* 70  BUN  --   < > 55* 12  CREATININE  --   < > 3.57* 1.44*  CALCIUM  --   < > 8.7* 8.7*  MG 2.7*  --   --  2.1  PHOS  --   < > 6.7* 2.8  < > = values in this interval not displayed. Liver Function  Tests  Recent Labs  07/08/17 1540 07/09/17 0443  ALBUMIN 3.3* 3.3*   No results for input(s): LIPASE, AMYLASE in the last 72 hours. Cardiac Enzymes No results for input(s): CKTOTAL, CKMB, CKMBINDEX, TROPONINI in the last 72 hours.  BNP: BNP (last 3 results)  Recent Labs  10/05/16 0147 05/30/17 1208 06/25/17 1253  BNP 237.4* 1,404.9* 875.7*    ProBNP (last 3 results)  Recent Labs  03/16/17 1401  PROBNP 6,530*        Imaging   Transthoracic Echocardiography 06/01/17  Study Conclusions  - Left ventricle: The cavity size was normal. There was mild   concentric hypertrophy. Systolic function was normal. The   estimated ejection fraction was in the range of 50% to 55%. Wall   motion was normal; there were no regional wall motion   abnormalities. - Ventricular septum: The contour showed diastolic flattening. - Aortic valve: Transvalvular velocity was minimally increased.   There was no stenosis. There was mild regurgitation. Peak    velocity (S): 201 cm/s. - Mitral valve: A bioprosthesis was present and functioning   normally. Mean gradient (D): 6 mm Hg. Valve area by pressure   half-time: 1.93 cm^2. - Left atrium: The atrium was severely dilated. - Right ventricle: The cavity size was moderately dilated. Wall   thickness was normal. Systolic function was moderately reduced. - Right atrium: The atrium was moderately dilated. - Tricuspid valve: There was severe regurgitation. - Pulmonary arteries: Systolic pressure was moderately increased.   PA peak pressure: 54 mm Hg (S).   RIGHT HEART CATH 06/29/17 RA mean 27 RV 63/8, mean 20 PA 58/28, mean 40 PCWP mean 28  Oxygen saturations: PA 69% AO 96%  Cardiac Output (Fick) 9.82  Cardiac Index (Fick) 4.96 PVR 1.2 WU     Medications:     Scheduled Medications: . amiodarone  400 mg Oral BID  . atorvastatin  20 mg Oral Daily  . Chlorhexidine Gluconate Cloth  6 each Topical Daily  . [START ON 07/11/2017] darbepoetin (ARANESP) injection - NON-DIALYSIS  200 mcg Subcutaneous Q Sat-1800  . furosemide  160 mg Oral TID  . insulin aspart  0-15 Units Subcutaneous TID WC  . insulin aspart  0-5 Units Subcutaneous QHS  . insulin aspart  4 Units Subcutaneous TID WC  . insulin detemir  10 Units Subcutaneous Q2200  . pantoprazole  40 mg Oral Daily  . potassium chloride  20 mEq Oral Once  . predniSONE  10 mg Oral Once  . [START ON 07/10/2017] predniSONE  5 mg Oral Once  . sodium chloride  2 spray Each Nare BID  . sodium chloride flush  10-40 mL Intracatheter Q12H  . sodium chloride flush  3 mL Intravenous Q12H    Infusions: . sodium chloride Stopped (07/01/17 1500)  . sodium chloride Stopped (07/06/17 0814)  . sodium chloride    . sodium chloride    . 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 Patient underwent mitral valve replacement and tricuspid valve repair in [place] in 2012.  Assessment/Plan   1. Acute on chronic diastolic CHF/RV failure: EF 50-55% on echo in 7/18 with prominent RV failure (enlarged/hypokinetic RV with D-shaped interventricular septum), severe TR.  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  Milrinone was started at admission for RV support, stopped now that he is getting RRT.  CVVH stopped 8/21 and had HD session on 8/22.   - Volume control with HD.   - Coreg stopped with low BP in HD.   2. AKI on CKD stage IV: Now getting RRT and off milrinone.  Also on lasix 160 mg three times a day. Making a small amount of urine but not vigorous => suspect he is going to be dependent on HD.  Long-term plan remains quite challenging. He has tolerated CVVH so may do ok with IHD though long-term will be difficult with RV failure.  He lives alone and doesn't have much help with his healthcare so chance at heart-kidney transplant would be very low. He tolerated HD yesterday.  - [facility] has seen, will get permanent access next week.  3. Atrial fibrillation: Persistent.  Has been in atrial fibrillation for a number of months it appears.   Not sure how well he will stay in NSR given severe TR and RV failure (even with amiodarone), but given the severity of his HF will attempt to return him to NSR.  - Rate controlled. Continue amio 400 mg twice a day.  - Plan for TEE/DCCV today, explained procedure to him and will  have [name] phone interpreter do the same.    - Continue warfarin.  INR 1.97 today so will start heparin gtt prior to DCCV.  4. Anemia: Had small amount of BRBPR last week. Now stopped.  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  - Hgb stable.  - Started on ESA per renal 5. Bioprosthetic mitral valve: Stable appearance on 7/18 echo.  No change.  6. Probable sleep apnea:  - Will need sleep study outpatient. No change.     Length of Stay: 19  [name], MD  07/09/2017, 8:19 AM  Advanced Heart Failure Team Pager [phone] (M-F; 7a - 4p)  Please contact [facility] for night-coverage after hours (4p -7a ) and weekends on [email or web address]

## 2017-07-09 NOTE — Anesthesia Preprocedure Evaluation (Addendum)
Anesthesia Evaluation  Patient identified by MRN, date of birth, ID band Patient awake, Patient confused and Patient unresponsive    Reviewed: Allergy & Precautions, NPO status , Patient's Chart, lab work & pertinent test results  Airway Mallampati: II       Dental  (+) Poor Dentition   Pulmonary Current Smoker,    Pulmonary exam normal        Cardiovascular hypertension, Pt. on home beta blockers  Rhythm:Regular     Neuro/Psych negative neurological ROS  negative psych ROS   GI/Hepatic GERD  ,  Endo/Other  diabetes, Insulin Dependent  Renal/GU      Musculoskeletal negative musculoskeletal ROS (+)   Abdominal Normal abdominal exam  (+)   Peds  Hematology  (+) Blood dyscrasia, anemia ,   Anesthesia Other Findings [facility]  CARDIAC CATHETERIZATION  Order# [id number]  Reading physician: Larey Dresser, MD Ordering physician: Larey Dresser, MD Study date: 06/29/17 Physicians   Panel Physicians Referring Physician Case Authorizing Physician Larey Dresser, MD (Primary)   Procedures   RIGHT HEART CATH Conclusion   1. Elevated right and left heart filling pressures, more prominent RV failure.  2. Pulmonary venous hypertension.  3. Cardiac output vigorous on milrinone.  Procedural   Vint Nieland  ECHO COMPLETE WO IMAGE ENHANCING AGENT  Order# [id number]  Reading physician: Jerline Pain, MD Ordering physician: Elease Hashimoto Study date: 06/01/17 Study Result   Result status: Final result                             *[facility]*                         [address], [place] [address]                            [phone]  ------------------------------------------------------------------- Transthoracic Echocardiography  Patient:    Michael [name], Michael [name] MR #:       [id number] Study Date:  06/01/2017 Gender:     M Age:        22 Height:     165.1 cm Weight:     83.1 kg BSA:        1.98 m^2 Pt. Status: Room:       [place] [id number]  PERFORMING   [facility], Inpatient  ADMITTING    Elwin Mocha  SONOGRAPHER  Chelsea Androw  cc:  ------------------------------------------------------------------- LV EF: 50% -   55%  ------------------------------------------------------------------- Indications:      CHF - 428.0.  ------------------------------------------------------------------- History:   PMH:  CKD. Mitral valve replacement Tricuspid valve repair.  Coronary artery disease.  Dilated cardiomyopathy.  Aortic valve disease.  Risk factors:  Diabetes mellitus.  ------------------------------------------------------------------- Study Conclusions  - Left ventricle: The cavity size was normal. There was mild   concentric hypertrophy. Systolic function was normal. The   estimated ejection fraction was in the range of 50% to 55%. Wall   motion  was normal; there were no regional wall motion   abnormalities. - Ventricular septum: The contour showed diastolic flattening. - Aortic valve: Transvalvular velocity was minimally increased.   There was no stenosis. There was mild regurgitation. Peak   velocity (S): 201 cm/s. - Mitral valve: A bioprosthesis was present and functioning   normally. Mean gradient (D): 6 mm Hg. Valve area by pressure   half-time: 1.93 cm^2. - Left atrium: The atrium was severely dilated. - Right ventricle: The cavity size was moderately dilated. Wall   thickness was normal. Systolic function was moderately reduced. - Right atrium: The atrium was moderately dilated. - Tricuspid valve: There was severe regurgitation. - Pulmonary arteries: Systolic pressure was moderately increased.   PA peak pressure: 54 mm Hg  (S).  ------------------------------------------------------------------- Study data:  Comparison was made to the study of 04/08/2017.  Study status:  Routine.  Procedure:  The patient reported no pain pre or post test    Reproductive/Obstetrics                            Anesthesia Physical Anesthesia Plan  ASA: IV  Anesthesia Plan: General   Post-op Pain Management:    Induction: Intravenous  PONV Risk Score and Plan: 1 and Ondansetron and Dexamethasone  Airway Management Planned: Natural Airway and Mask  Additional Equipment:   Intra-op Plan:   Post-operative Plan:   Informed Consent: I have reviewed the patients History and Physical, chart, labs and discussed the procedure including the risks, benefits and alternatives for the proposed anesthesia with the patient or authorized representative who has indicated his/her understanding and acceptance.   Dental advisory given  Plan Discussed with: CRNA and Surgeon  Anesthesia Plan Comments:        Anesthesia Quick Evaluation

## 2017-07-09 NOTE — Progress Notes (Signed)
ANTICOAGULATION CONSULT NOTE - FOLLOW UP    HL = 0.14 (goal 0.3 - 0.7 units/mL) Heparin dosing weight = 68 kg   Assessment: 89 YOM with history of Afib and bioprosthetic MVR to continue on IV heparin while Coumadin is on hold.  Patient is s/p TEE/DCCV today.  Heparin level is sub-therapeutic; no bleeding reported.  No interruption/complication with heparin infusion per RN.   Plan: - Increase heparin gtt to 1250 units/hr - Check 6 hr heparin level    Elasia Furnish D. Mina Marble, PharmD, BCPS 07/09/2017, 7:37 PM

## 2017-07-09 NOTE — Progress Notes (Signed)
Patient ID: Michael [name], male   DOB: Dec 28, 1960, 56 y.o.   MRN: [id number]     Advanced Heart Failure Rounding Note  Primary Cardiologist: Dr. Harrington Challenger HF Cardiology: Aundra Dubin  Subjective:    Started CVVHD 8/15 and stopped milrinone. CVVH stopped 8/21. Had HD on 8/22, tolerated reasonably well.    Remains in rate-controlled atrial fibrillation.    Studies: Echo (7/18): EF 50-55%, mild LVH, mild AI, bioprosthetic mitral valve appeared to function normally, RV moderately dilated with moderately decreased systolic function, D-shaped interventricular septum suggestive of RV pressure/volume overload, severe TR, PASP 54 mmHg  RHC Procedural Findings: Hemodynamics (mmHg) RA mean 27 RV 63/8, mean 20 PA 58/28, mean 40 PCWP mean 28 Oxygen saturations: PA 69% AO 96% Cardiac Output (Fick) 9.82  Cardiac Index (Fick) 4.96 PVR 1.2 WU  Objective:   Weight Range: 150 lb 2.1 oz (68.1 kg) Body mass index is 24.98 kg/m.   Vital Signs:   Temp:  [98 F (36.7 C)-98.8 F (37.1 C)] 98.1 F (36.7 C) (08/23 0748) Pulse Rate:  [72-163] 81 (08/23 0748) Resp:  [13-28] 22 (08/23 0748) BP: (87-118)/(54-76) 91/66 (08/23 0748) SpO2:  [96 %-100 %] 100 % (08/23 0748) Weight:  [150 lb 2.1 oz (68.1 kg)-152 lb 5.4 oz (69.1 kg)] 150 lb 2.1 oz (68.1 kg) (08/23 0320) Last BM Date: 07/08/17  Weight change: Filed Weights   07/08/17 0300 07/08/17 2302 07/09/17 0320  Weight: 151 lb 14.4 oz (68.9 kg) 152 lb 5.4 oz (69.1 kg) 150 lb 2.1 oz (68.1 kg)    Intake/Output:   Intake/Output Summary (Last 24 hours) at 07/09/17 0819 Last data filed at 07/09/17 0313  Gross per 24 hour  Intake              910 ml  Output             1540 ml  Net             -630 ml      Physical Exam   General: Well appearing. No resp difficulty. In bed  HEENT: normal Neck: supple. JVP 12 cm. RIJ HD catheter. Carotids 2+ bilat; no bruits. No lymphadenopathy or thryomegaly appreciated. Cor: PMI nondisplaced. Irregular rate  &rhythm. + RV lift. No rubs, gallops or murmurs. Lungs: clear Abdomen: soft, nontender, nondistended. No hepatosplenomegaly. No bruits or masses. Good bowel sounds. Extremities: no cyanosis, clubbing, rash, edema Neuro: alert & orientedx3, cranial nerves grossly intact. moves all 4 extremities w/o difficulty. Affect pleasant  Telemetry  Personally reviewed. Atrial fibrillation rate 70s.    Labs    CBC  Recent Labs  07/08/17 0338 07/09/17 0443  WBC 11.4* 10.9*  HGB 8.7* 8.8*  HCT 26.7* 26.8*  MCV 76.5* 75.3*  PLT 212 440   Basic Metabolic Panel  Recent Labs  07/08/17 0338  07/08/17 1540 07/09/17 0443  NA  --   < > 131* 137  K  --   < > 5.0 3.3*  CL  --   < > 99* 99*  CO2  --   < > 22 29  GLUCOSE  --   < > 123* 70  BUN  --   < > 55* 12  CREATININE  --   < > 3.57* 1.44*  CALCIUM  --   < > 8.7* 8.7*  MG 2.7*  --   --  2.1  PHOS  --   < > 6.7* 2.8  < > = values in this interval not displayed. Liver Function  Tests  Recent Labs  07/08/17 1540 07/09/17 0443  ALBUMIN 3.3* 3.3*   No results for input(s): LIPASE, AMYLASE in the last 72 hours. Cardiac Enzymes No results for input(s): CKTOTAL, CKMB, CKMBINDEX, TROPONINI in the last 72 hours.  BNP: BNP (last 3 results)  Recent Labs  10/05/16 0147 05/30/17 1208 06/25/17 1253  BNP 237.4* 1,404.9* 875.7*    ProBNP (last 3 results)  Recent Labs  03/16/17 1401  PROBNP 6,530*        Imaging   Transthoracic Echocardiography 06/01/17  Study Conclusions  - Left ventricle: The cavity size was normal. There was mild   concentric hypertrophy. Systolic function was normal. The   estimated ejection fraction was in the range of 50% to 55%. Wall   motion was normal; there were no regional wall motion   abnormalities. - Ventricular septum: The contour showed diastolic flattening. - Aortic valve: Transvalvular velocity was minimally increased.   There was no stenosis. There was mild regurgitation. Peak    velocity (S): 201 cm/s. - Mitral valve: A bioprosthesis was present and functioning   normally. Mean gradient (D): 6 mm Hg. Valve area by pressure   half-time: 1.93 cm^2. - Left atrium: The atrium was severely dilated. - Right ventricle: The cavity size was moderately dilated. Wall   thickness was normal. Systolic function was moderately reduced. - Right atrium: The atrium was moderately dilated. - Tricuspid valve: There was severe regurgitation. - Pulmonary arteries: Systolic pressure was moderately increased.   PA peak pressure: 54 mm Hg (S).   RIGHT HEART CATH 06/29/17 RA mean 27 RV 63/8, mean 20 PA 58/28, mean 40 PCWP mean 28  Oxygen saturations: PA 69% AO 96%  Cardiac Output (Fick) 9.82  Cardiac Index (Fick) 4.96 PVR 1.2 WU     Medications:     Scheduled Medications: . amiodarone  400 mg Oral BID  . atorvastatin  20 mg Oral Daily  . Chlorhexidine Gluconate Cloth  6 each Topical Daily  . [START ON 07/11/2017] darbepoetin (ARANESP) injection - NON-DIALYSIS  200 mcg Subcutaneous Q Sat-1800  . furosemide  160 mg Oral TID  . insulin aspart  0-15 Units Subcutaneous TID WC  . insulin aspart  0-5 Units Subcutaneous QHS  . insulin aspart  4 Units Subcutaneous TID WC  . insulin detemir  10 Units Subcutaneous Q2200  . pantoprazole  40 mg Oral Daily  . potassium chloride  20 mEq Oral Once  . predniSONE  10 mg Oral Once  . [START ON 07/10/2017] predniSONE  5 mg Oral Once  . sodium chloride  2 spray Each Nare BID  . sodium chloride flush  10-40 mL Intracatheter Q12H  . sodium chloride flush  3 mL Intravenous Q12H    Infusions: . sodium chloride Stopped (07/01/17 1500)  . sodium chloride Stopped (07/06/17 0814)  . sodium chloride    . sodium chloride    . 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 Patient underwent mitral valve replacement and tricuspid valve repair in [place] in 2012.  Assessment/Plan   1. Acute on chronic diastolic CHF/RV failure: EF 50-55% on echo in 7/18 with prominent RV failure (enlarged/hypokinetic RV with D-shaped interventricular septum), severe TR.  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  Milrinone was started at admission for RV support, stopped now that he is getting RRT.  CVVH stopped 8/21 and had HD session on 8/22.   - Volume control with HD.   - Coreg stopped with low BP in HD.   2. AKI on CKD stage IV: Now getting RRT and off milrinone.  Also on lasix 160 mg three times a day. Making a small amount of urine but not vigorous => suspect he is going to be dependent on HD.  Long-term plan remains quite challenging. He has tolerated CVVH so may do ok with IHD though long-term will be difficult with RV failure.  He lives alone and doesn't have much help with his healthcare so chance at heart-kidney transplant would be very low. He tolerated HD yesterday.  - [facility] has seen, will get permanent access next week.  3. Atrial fibrillation: Persistent.  Has been in atrial fibrillation for a number of months it appears.   Not sure how well he will stay in NSR given severe TR and RV failure (even with amiodarone), but given the severity of his HF will attempt to return him to NSR.  - Rate controlled. Continue amio 400 mg twice a day.  - Plan for TEE/DCCV today, explained procedure to him and will  have [name] phone interpreter do the same.    - Continue warfarin.  INR 1.97 today so will start heparin gtt prior to DCCV.  4. Anemia: Had small amount of BRBPR last week. Now stopped.  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  - Hgb stable.  - Started on ESA per renal 5. Bioprosthetic mitral valve: Stable appearance on 7/18 echo.  No change.  6. Probable sleep apnea:  - Will need sleep study outpatient. No change.     Length of Stay: 20  [name], MD  07/09/2017, 8:19 AM  Advanced Heart Failure Team Pager [phone] (M-F; 7a - 4p)  Please contact [facility] for night-coverage after hours (4p -7a ) and weekends on [email or web address]

## 2017-07-09 NOTE — Care Management Note (Addendum)
Case Management Note Previous Note Created by Tomi Bamberger 07/02/17  Patient Details  Name: Michael [name] MRN: [id number] Date of Birth: [birth year]  Subjective/Objective:    From home alone, hea has a daughter who works and goes to school every day, Patient is established with [place] [facility] Sidles will reschedule patient's follow up appt when he is near discharge. He is also active with [facility], and HHPT, also will be followed by [facility].  He has Acute /Chronic CHF, AKI, CKD IV, afib,  Anemia, bioprosthetic MV.  CVP remains elevated , HD cath place 8/14 and CRRT started for volume removal.  Plan for TEE and DCCV when volume is better per MD  Note.      8/16 [place], BSN - NCM spoke with patient and informed him that he will be with [facility] for [facility] , he states that is fine ,Bethena Roys is aware. He will need daily weights and if needed iv lasix at home, Per MD they do give iv lasix thru [facility] agency at home.  He conts to receive CRRT today                Action/Plan:   Expected Discharge Date:                  Expected Discharge Plan:  [place]  In-House Referral:  NA  Discharge planning Services  CM Consult, Follow-up appt scheduled  Post Acute Care Choice:  Home Health Choice offered to:  Patient  DME Arranged:    DME Agency:     HH Arranged:  RN, PT [place] Agency:  [place]  Status of Service:  In process, will continue to follow  If discussed at Long Length of Stay Meetings, dates discussed:    Additional Comments: 07/09/2017  Pt discussed in LOS 8/23 - remains appropriate for continued stay.  Pt failed Cardioversion  07/09/17 and remains on IV heparin.  Pt has been deemed long term HD - last session was 8/22.  Perm access scheduled for next week.  CM left message with HD to inquire about clipping process  07/08/17 Pt transferred from [facility].  Pt came of CRRT 07/06/17 - and will now start on IHD -  daily assessments as pt is new to HD.  CM confirmed with HD unit that clipping has not started yet due to uncertainty of HD long term.  Pt is active with EMMI program and both [facility] and [facility] following.  CM requested [facility] to assist with transportation to appts - [facility] CM will also consult with [facility] for community resources including transportation.  Palliative following for [name], RN 07/09/2017, 3:37 PM

## 2017-07-09 NOTE — Anesthesia Procedure Notes (Signed)
Procedure Name: MAC Date/Time: 07/09/2017 10:12 AM Performed by: Garrison Columbus T Pre-anesthesia Checklist: Patient identified, Emergency Drugs available, Suction available and Patient being monitored Patient Re-evaluated:Patient Re-evaluated prior to induction Oxygen Delivery Method: Nasal cannula Preoxygenation: Pre-oxygenation with 100% oxygen Induction Type: IV induction Placement Confirmation: positive ETCO2 and breath sounds checked- equal and bilateral Dental Injury: Teeth and Oropharynx as per pre-operative assessment

## 2017-07-09 NOTE — CV Procedure (Signed)
Procedure: TEE  Indication: Atrial fibrillation  Sedation: per anesthesiology  Findings: Please see echo section for full report.  Normal LV size and systolic function, EF 97-98%.  Normal wall thickness and motion.  D-shaped interventricular septum suggestive of RV pressure/volume overload.  The RV was severely dilated with moderately decreased systolic function.  Severe LAE, no thrombus seen.  The LA appendage appears to have been ligated.  Moderate right atrial enlargement.  No PFO/ASD by color doppler.  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  Peak RV-RA gradient 25 mmHg.  Status post mitral valve replacement with bioprosthetic valve.  Trivial MR.  Mean gradient 6 mmHg across valve, does not appear to have significant stenosis.  Trileaflet aortic valve with moderate aortic regurgitation, no significant stenosis.  Normal caliber aorta with grade III plaque in descending thoracic aorta.   Proceed to DCCV.  [name] 07/09/2017 10:52 AM

## 2017-07-09 NOTE — Progress Notes (Signed)
Physical Therapy Cancellation Note    07/09/17 0942  PT Visit Information  Last PT Received On 07/09/17  Reason Eval/Treat Not Completed Patient at procedure or test/unavailable; PT will check on pt later as time allows.    Earney Navy, PTA Pager: [phone]

## 2017-07-09 NOTE — Interval H&P Note (Signed)
History and Physical Interval Note:  07/09/2017 10:20 AM  Michael [name]  has presented today for surgery, with the diagnosis of AFIB  The various methods of treatment have been discussed with the patient and family. After consideration of risks, benefits and other options for treatment, the patient has consented to  Procedure(s): TRANSESOPHAGEAL ECHOCARDIOGRAM (TEE) (N/A) CARDIOVERSION (N/A) as a surgical intervention .  The patient's history has been reviewed, patient examined, no change in status, stable for surgery.  I have reviewed the patient's chart and labs.  Questions were answered to the patient's satisfaction.     Herndon Grill [employer]

## 2017-07-09 NOTE — Consult Note (Signed)
Pt for left arm access and tunneled catheter by Dr Donzetta Matters on Monday Will need INR < 1.7 Please hold coumadin  Ruta Hinds, MD [facility] [place]: [phone] Pager: [phone]

## 2017-07-09 NOTE — Progress Notes (Signed)
[place] for warfarin (hold); start Heparin  Indication: atrial fibrillation  No Known Allergies  Labs:  Recent Labs  07/07/17 0330  07/07/17 0755  07/08/17 0338 07/08/17 0353 07/08/17 1540 07/09/17 0443  HGB  --   < > 8.8*  --  8.7*  --   --  8.8*  HCT  --   --  26.3*  --  26.7*  --   --  26.8*  PLT  --   --  228  --  212  --   --  216  LABPROT 28.2*  --   --   --  25.3*  --   --  22.7*  INR 2.58  --   --   --  2.26  --   --  1.97  CREATININE 2.43*  --   --   < >  --  3.02* 3.57* 1.44*  < > = values in this interval not displayed.  Estimated Creatinine Clearance: 49.2 mL/min (A) (by C-G formula based on SCr of 1.44 mg/dL (H)).   Assessment: Pt on warfarin PTA for afib/MVR (bioprosthetic).  Hgb stable at 8.8. INR this am is slightly below goal at 1.97. Pharmacy asked to begin IV heparin prior to TEE/cardioversion this morning.  Planning to hold Coumadin to place HD access next Monday.  PTA warfarin dose was 2.5 mg daily  Goal of Therapy:  INR 2-3  Monitor platelets by anticoagulation protocol: Yes   Plan:  -Hold Coumadin -Start IV heparin at rate of 1000 units/hr. -Check heparin level 8 hrs after gtt starts. -Daily heparin level and CBC.  Uvaldo Rising, BCPS  Clinical Pharmacist Pager [phone]  07/09/2017 10:10 AM

## 2017-07-09 NOTE — Progress Notes (Signed)
Bilateral  Upper Extremity Vein Map   Right Cephalic  Segment Diameter Depth Comment  1. Axilla mm mm Unable to follow possibly due to movement  2. Mid upper arm mm mm Unable to follow possibly due to movement  3. Above AC 1.75mm 2.75mm   4. In AC 2.15mm 2.71mm   5. Below AC 2.37mm 1.24mm   6. Mid forearm 1.33mm 1.16mm   7. Wrist 1.81mm 3.2XM    Right Basilic  Segment Diameter Depth Comment  2. Mid upper arm 5.51mm 13.40mm Enters Brachial  3. Above [facility] 2.11mm 3.82mm   4. In AC 2.75mm 2.54mm   5. Below AC 1.24mm 3.23mm   6. Mid forearm 2.50mm 3.28mm   7. Wrist mm mm Unable to visualize possibly due to movement    Left  Cephalic  Segment Diameter Depth Comment  1. Axilla 2.18mm 2.29mm   2. Mid upper arm 2.50mm 2.10mm   3. Above AC 2.74mm 2.28mm   4. In AC 3.46mm 2.74mm   5. Below AC 2.16mm 1.28mm   6. Mid forearm 2.74mm 2.39mm   7. Wrist 2.67mm 1.4JW    Left Basilic  Segment Diameter Depth Comment  1. Proximal upper arm 4.36mm 9.47mm Enters the Brachial  2. Mid upper arm 4.64mm 7.30mm   3. Above [facility] 3.49mm 3.75mm   4. In AC 2.32mm 2.92mm Branch  5. Below AC 2.41mm 1.40mm   6. Mid forearm 1.74mm 1.18mm Difficult to visualize  7. Wrist 1.22mm 1.35mm    Mild technical difficulty on the right due to movement. All segments of the veins imaged were patent and compressible.  [facility], [place] 07/10/2107 4:49 PM

## 2017-07-09 NOTE — Procedures (Signed)
Electrical Cardioversion Procedure Note Michael [name] [id number] 1961-01-28  Procedure: Electrical Cardioversion Indications:  Atrial Fibrillation  Procedure Details Consent: Risks of procedure as well as the alternatives and risks of each were explained to the (patient/caregiver).  Consent for procedure obtained. Time Out: Verified patient identification, verified procedure, site/side was marked, verified correct patient position, special equipment/implants available, medications/allergies/relevent history reviewed, required imaging and test results available.  Performed  Patient placed on cardiac monitor, pulse oximetry, supplemental oxygen as necessary.  Sedation given: Propofol per anesthesiology Pacer pads placed anterior and posterior chest.  Cardioverted 3 time(s).  Cardioverted at [place].  Evaluation Findings: Post procedure EKG shows: NSR Complications: None Patient did tolerate procedure well.   [name] 07/09/2017, 10:53 AM

## 2017-07-10 ENCOUNTER — Encounter (HOSPITAL_COMMUNITY): Payer: Self-pay | Admitting: Cardiology

## 2017-07-10 ENCOUNTER — Encounter: Payer: Self-pay | Admitting: Pharmacist

## 2017-07-10 LAB — RENAL FUNCTION PANEL
ALBUMIN: 3 g/dL — AB (ref 3.5–5.0)
Anion gap: 11 (ref 5–15)
BUN: 35 mg/dL — ABNORMAL HIGH (ref 6–20)
CALCIUM: 8.7 mg/dL — AB (ref 8.9–10.3)
CO2: 27 mmol/L (ref 22–32)
CREATININE: 2.97 mg/dL — AB (ref 0.61–1.24)
Chloride: 95 mmol/L — ABNORMAL LOW (ref 101–111)
GFR calc Af Amer: 25 mL/min — ABNORMAL LOW (ref >60)
GFR calc non Af Amer: 22 mL/min — ABNORMAL LOW (ref >60)
GLUCOSE: 87 mg/dL (ref 65–99)
PHOSPHORUS: 4.2 mg/dL (ref 2.5–4.6)
Potassium: 3.7 mmol/L (ref 3.5–5.1)
SODIUM: 133 mmol/L — AB (ref 135–145)

## 2017-07-10 LAB — GLUCOSE, CAPILLARY
GLUCOSE-CAPILLARY: 144 mg/dL — AB (ref 65–99)
GLUCOSE-CAPILLARY: 96 mg/dL (ref 65–99)
Glucose-Capillary: 105 mg/dL — ABNORMAL HIGH (ref 65–99)

## 2017-07-10 LAB — HEPARIN LEVEL (UNFRACTIONATED)
Heparin Unfractionated: 0.28 IU/mL — ABNORMAL LOW (ref 0.30–0.70)
Heparin Unfractionated: 0.85 IU/mL — ABNORMAL HIGH (ref 0.30–0.70)
Heparin Unfractionated: 0.7 IU/mL (ref 0.30–0.70)

## 2017-07-10 LAB — PROTIME-INR
INR: 2.09
Prothrombin Time: 23.8 seconds — ABNORMAL HIGH (ref 11.4–15.2)

## 2017-07-10 LAB — CBC
HEMATOCRIT: 24.6 % — AB (ref 39.0–52.0)
HEMOGLOBIN: 8 g/dL — AB (ref 13.0–17.0)
MCH: 24.5 pg — AB (ref 26.0–34.0)
MCHC: 32.5 g/dL (ref 30.0–36.0)
MCV: 75.5 fL — AB (ref 78.0–100.0)
Platelets: 178 10*3/uL (ref 150–400)
RBC: 3.26 MIL/uL — ABNORMAL LOW (ref 4.22–5.81)
RDW: 19.1 % — ABNORMAL HIGH (ref 11.5–15.5)
WBC: 11.7 10*3/uL — ABNORMAL HIGH (ref 4.0–10.5)

## 2017-07-10 LAB — MAGNESIUM: Magnesium: 2 mg/dL (ref 1.7–2.4)

## 2017-07-10 MED ORDER — HEPARIN (PORCINE) IN NACL 100-0.45 UNIT/ML-% IJ SOLN
1300.0000 [IU]/h | INTRAMUSCULAR | Status: DC
  Administered 2017-07-10 – 2017-07-12 (×4): 1300 [IU]/h via INTRAVENOUS
  Filled 2017-07-10 (×3): qty 250

## 2017-07-10 MED ORDER — HEPARIN (PORCINE) IN NACL 100-0.45 UNIT/ML-% IJ SOLN
1350.0000 [IU]/h | INTRAMUSCULAR | Status: DC
  Filled 2017-07-10 (×2): qty 250

## 2017-07-10 NOTE — Progress Notes (Signed)
Subjective: Interval History: has no complaint, asking about HD.Marland Kitchen  Objective: Vital signs in last 24 hours: Temp:  [97.7 F (36.5 C)-99.1 F (37.3 C)] 98.6 F (37 C) (08/24 0712) Pulse Rate:  [66-93] 91 (08/24 0723) Resp:  [7-19] 14 (08/24 0723) BP: (91-109)/(47-72) 96/63 (08/24 0723) SpO2:  [95 %-100 %] 98 % (08/24 0450) FiO2 (%):  [99 %] 99 % (08/23 1103) Weight:  [73.6 kg (162 lb 4.1 oz)] 73.6 kg (162 lb 4.1 oz) (08/24 0712) Weight change: 4.5 kg (9 lb 14.7 oz)  Intake/Output from previous day: 08/23 0701 - 08/24 0700 In: 1848.6 [P.O.:1320; I.V.:528.6] Out: 227 [Urine:226; Stool:1] Intake/Output this shift: No intake/output data recorded.  General appearance: alert, cooperative and no distress Neck: RIJ cath Resp: RIJ cath Cardio: irregularly irregular rhythm, S1, S2 normal and systolic murmur: systolic ejection 2/6, decrescendo at 2nd left intercostal space GI: below Extremities: soft, pos bs, liver down 5 cm  ABdm pos bs, soft. Liver down 4 cm Resp few bibasilar crackles Lab Results:  Recent Labs  07/09/17 0443 07/10/17 0456  WBC 10.9* 11.7*  HGB 8.8* 8.0*  HCT 26.8* 24.6*  PLT 216 178   BMET:  Recent Labs  07/09/17 0443 07/10/17 0456  NA 137 133*  K 3.3* 3.7  CL 99* 95*  CO2 29 27  GLUCOSE 70 87  BUN 12 35*  CREATININE 1.44* 2.97*  CALCIUM 8.7* 8.7*   No results for input(s): PTH in the last 72 hours. Iron Studies: No results for input(s): IRON, TIBC, TRANSFERRIN, FERRITIN in the last 72 hours.  Studies/Results: No results found.  I have reviewed the patient's current medications.  Assessment/Plan: 1 ESRD for HD.  Vol ok.  Need access 2 CM stable 3 Anemia esa/Fe 4 HPTH 5 DM controlled P HD, esa, access,     LOS: 13 days   Augustin Bun L 07/10/2017,8:12 AM

## 2017-07-10 NOTE — Progress Notes (Signed)
Patient ID: Michael [name], male   DOB: 1961/03/06, 56 y.o.   MRN: [id number]     Advanced Heart Failure Rounding Note  Primary Cardiologist: Dr. Harrington Challenger HF Cardiology: Aundra Dubin  Subjective:    Started CVVHD 8/15 and stopped milrinone. CVVH stopped 8/21. Had HD on 8/22, tolerated reasonably well.  He is getting HD again this morning.   On 8/23, he had TEE-guided DCCV to NSR.  However, today he is back in atrial fibrillation.   Studies: Echo (7/18): EF 50-55%, mild LVH, mild AI, bioprosthetic mitral valve appeared to function normally, RV moderately dilated with moderately decreased systolic function, D-shaped interventricular septum suggestive of RV pressure/volume overload, severe TR, PASP 54 mmHg  RHC Procedural Findings: Hemodynamics (mmHg) RA mean 27 RV 63/8, mean 20 PA 58/28, mean 40 PCWP mean 28 Oxygen saturations: PA 69% AO 96% Cardiac Output (Fick) 9.82  Cardiac Index (Fick) 4.96 PVR 1.2 WU  TEE (8/23): Normal LV size and systolic function, EF 38-25%.  Normal wall thickness and motion.  D-shaped interventricular septum suggestive of RV pressure/volume overload.  The RV was severely dilated with moderately decreased systolic function.  Severe LAE, no thrombus seen.  The LA appendage appears to have been ligated.  Moderate right atrial enlargement.  No PFO/ASD by color doppler.  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  Peak RV-RA gradient 25 mmHg.  Status post mitral valve replacement with bioprosthetic valve.  Trivial MR.  Mean gradient 6 mmHg across valve, does not appear to have significant stenosis.  Trileaflet aortic valve with moderate aortic regurgitation, no significant stenosis.  Normal caliber aorta with grade III plaque in descending thoracic aorta.   Objective:   Weight Range: 162 lb 4.1 oz (73.6 kg) Body mass index is 27 kg/m.   Vital Signs:   Temp:  [97.7 F (36.5 C)-99.1 F (37.3 C)] 98.6 F (37 C) (08/24 0712) Pulse Rate:  [66-95] 95 (08/24  0930) Resp:  [7-19] 14 (08/24 0930) BP: (82-109)/(47-81) 91/63 (08/24 0930) SpO2:  [95 %-100 %] 99 % (08/24 0900) FiO2 (%):  [99 %] 99 % (08/23 1103) Weight:  [162 lb 4.1 oz (73.6 kg)] 162 lb 4.1 oz (73.6 kg) (08/24 0712) Last BM Date: 07/09/17  Weight change: Filed Weights   07/09/17 0320 07/10/17 0450 07/10/17 0712  Weight: 150 lb 2.1 oz (68.1 kg) 162 lb 4.1 oz (73.6 kg) 162 lb 4.1 oz (73.6 kg)    Intake/Output:   Intake/Output Summary (Last 24 hours) at 07/10/17 0953 Last data filed at 07/10/17 0504  Gross per 24 hour  Intake          1848.64 ml  Output              227 ml  Net          1621.64 ml      Physical Exam   General: Well appearing. No resp difficulty. In bed  HEENT: normal Neck: supple. JVP 12cm. RIJ HD catheter. Carotids 2+ bilat; no bruits. No lymphadenopathy or thryomegaly appreciated. Cor: PMI nondisplaced. Irregular rate &rhythm. + RV lift. No rubs, gallops or murmurs. Lungs: clear Abdomen: soft, nontender, nondistended. No hepatosplenomegaly. No bruits or masses. Good bowel sounds. Extremities: no cyanosis, clubbing, rash, edema Neuro: alert & orientedx3, cranial nerves grossly intact. moves all 4 extremities w/o difficulty. Affect pleasant  Telemetry  Personally reviewed. Atrial fibrillation rate 90s.    Labs    CBC  Recent Labs  07/09/17 0443 07/10/17 0456  WBC 10.9* 11.7*  HGB 8.8* 8.0*  HCT 26.8* 24.6*  MCV 75.3* 75.5*  PLT 216 657   Basic Metabolic Panel  Recent Labs  07/09/17 0443 07/10/17 0455 07/10/17 0456  NA 137  --  133*  K 3.3*  --  3.7  CL 99*  --  95*  CO2 29  --  27  GLUCOSE 70  --  87  BUN 12  --  35*  CREATININE 1.44*  --  2.97*  CALCIUM 8.7*  --  8.7*  MG 2.1 2.0  --   PHOS 2.8  --  4.2   Liver Function Tests  Recent Labs  07/09/17 0443 07/10/17 0456  ALBUMIN 3.3* 3.0*   No results for input(s): LIPASE, AMYLASE in the last 72 hours. Cardiac Enzymes No results for input(s): CKTOTAL, CKMB,  CKMBINDEX, TROPONINI in the last 72 hours.  BNP: BNP (last 3 results)  Recent Labs  10/05/16 0147 05/30/17 1208 06/25/17 1253  BNP 237.4* 1,404.9* 875.7*    ProBNP (last 3 results)  Recent Labs  03/16/17 1401  PROBNP 6,530*        Imaging   Transthoracic Echocardiography 06/01/17  Study Conclusions  - Left ventricle: The cavity size was normal. There was mild   concentric hypertrophy. Systolic function was normal. The   estimated ejection fraction was in the range of 50% to 55%. Wall   motion was normal; there were no regional wall motion   abnormalities. - Ventricular septum: The contour showed diastolic flattening. - Aortic valve: Transvalvular velocity was minimally increased.   There was no stenosis. There was mild regurgitation. Peak   velocity (S): 201 cm/s. - Mitral valve: A bioprosthesis was present and functioning   normally. Mean gradient (D): 6 mm Hg. Valve area by pressure   half-time: 1.93 cm^2. - Left atrium: The atrium was severely dilated. - Right ventricle: The cavity size was moderately dilated. Wall   thickness was normal. Systolic function was moderately reduced. - Right atrium: The atrium was moderately dilated. - Tricuspid valve: There was severe regurgitation. - Pulmonary arteries: Systolic pressure was moderately increased.   PA peak pressure: 54 mm Hg (S).   RIGHT HEART CATH 06/29/17 RA mean 27 RV 63/8, mean 20 PA 58/28, mean 40 PCWP mean 28  Oxygen saturations: PA 69% AO 96%  Cardiac Output (Fick) 9.82  Cardiac Index (Fick) 4.96 PVR 1.2 WU     Medications:     Scheduled Medications: . amiodarone  400 mg Oral BID  . atorvastatin  20 mg Oral Daily  . Chlorhexidine Gluconate Cloth  6 each Topical Daily  . [START ON 07/11/2017] darbepoetin (ARANESP) injection - NON-DIALYSIS  200 mcg Subcutaneous Q Sat-1800  . insulin aspart  0-15 Units Subcutaneous TID WC  . insulin aspart  0-5 Units Subcutaneous QHS  . insulin aspart   4 Units Subcutaneous TID WC  . insulin detemir  10 Units Subcutaneous Q2200  . pantoprazole  40 mg Oral Daily  . predniSONE  5 mg Oral Once  . sodium chloride  2 spray Each Nare BID  . sodium chloride flush  10-40 mL Intracatheter Q12H  . sodium chloride flush  3 mL Intravenous Q12H  . sodium chloride flush  3 mL Intravenous Q12H    Infusions: . sodium chloride Stopped (07/01/17 1500)  . sodium chloride Stopped (07/06/17 0814)  . sodium chloride    . sodium chloride Stopped (07/09/17 1151)  . sodium chloride    . 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 Patient underwent mitral valve replacement and tricuspid valve repair in [place] in 2012.    Assessment/Plan   1. Acute on chronic diastolic CHF/RV failure: EF 50-55% on echo in 7/18 with prominent RV failure (enlarged/hypokinetic RV with D-shaped interventricular septum), severe TR.  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  Milrinone was started at admission for RV support, stopped now that he is getting RRT.  CVVH stopped 8/21 and had HD session on 8/22.   - Volume control with HD.   - Coreg stopped with low BP in HD.   2. AKI on CKD stage IV: Now getting RRT and off milrinone.  He is going to require long-term HD, significant RV failure will complicate this but  he has done well with it so far.  He lives alone and doesn't have much help with his healthcare so chance at heart-kidney transplant would be very low.  - VVS has seen, will get permanent access next week.  3. Atrial fibrillation: Persistent.  Has been in atrial fibrillation for a number of months it appears.   I was not sure how long he could stay in NSR with DCCV given severe TR and RV failure (even with amiodarone), but given the severity of his HF, I did a TEE-guided DCCV on 8/23.  However, he is back in atrial fibrillation today with HR in 90s.  - Would not re-attempt DCCV.  Will continue amiodarone today but if stays in afib persistently again, would stop amiodarone tomorrow and start Toprol XL for rate control.  - On heparin gtt and off warfarin for access placement next week.  4. Anemia: Had small amount of BRBPR last week. Now stopped.  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  - Hgb stable.  - Started on ESA per renal 5. Bioprosthetic mitral valve: Stable appearance on 7/18 echo.  No change.  6. Probable sleep apnea:  - Will need sleep study outpatient. No change.     Length of Stay: 75  [name], MD  07/10/2017, 9:53 AM  Advanced Heart Failure Team Pager [phone] (M-F; 7a - 4p)  Please contact [facility] for night-coverage after hours (4p -7a ) and weekends on [email or web address]

## 2017-07-10 NOTE — Consult Note (Signed)
Vein map reviewed. Left cephalic vein just under 65mm should be ok for left arm fistula and catheter by Dr Donzetta Matters on Monday if INR reasonable.  Plan procedure risks benefits discussed with pt. Including but not limited to bleeding infection steal non maturation.  Ruta Hinds, MD [facility] [place] Office: [phone] Pager: [phone]

## 2017-07-10 NOTE — Care Management Note (Signed)
Case Management Note Previous Note Created by Tomi Bamberger 07/02/17  Patient Details  Name: Michael [name] MRN: [id number] Date of Birth: 25-Dec-1960  Subjective/Objective:    From home alone, hea has a daughter who works and goes to school every day, Patient is established with [place] [facility] Sidles will reschedule patient's follow up appt when he is near discharge. [facility] is also [facility] with [facility], and HHPT, also will be followed by [facility].  He has Acute /Chronic CHF, AKI, CKD IV, afib,  Anemia, bioprosthetic MV.  CVP remains elevated , HD cath place 8/14 and CRRT started for volume removal.  Plan for TEE and DCCV when volume is better per MD  Note.      8/16 [place], BSN - NCM spoke with patient and informed him that he will be with [facility] for [facility] , he states that is fine ,Bethena Roys is aware. He will need daily weights and if needed iv lasix at home, Per MD they do give iv lasix thru [facility] agency at home.  He conts to receive CRRT today                Action/Plan:   Expected Discharge Date:                  Expected Discharge Plan:  [place]  In-House Referral:  NA  Discharge planning Services  CM Consult, Follow-up appt scheduled  Post Acute Care Choice:  Home Health Choice offered to:  Patient  DME Arranged:    DME Agency:     HH Arranged:  RN, PT [place] Agency:  [place]  Status of Service:  In process, will continue to follow  If discussed at Long Length of Stay Meetings, dates discussed:    Additional Comments: 07/10/2017  Clipping process initiated 8/24  Pt discussed in LOS 8/23 - remains appropriate for continued stay.  Pt failed Cardioversion  07/09/17 and remains on IV heparin.  Pt has been deemed long term HD - last session was 8/22.  Perm access scheduled for next week.  CM left message with HD to inquire about clipping process  07/08/17 Pt transferred from [facility].  Pt came of CRRT 07/06/17  - and will now start on IHD - daily assessments as pt is new to HD.  CM confirmed with HD unit that clipping has not started yet due to uncertainty of HD long term.  Pt is active with EMMI program and both [facility] and [facility] following.  CM requested [facility] to assist with transportation to appts - [facility] CM will also consult with [facility] SW for community resources including transportation.  Palliative following for [name], RN 07/10/2017, 1:40 PM

## 2017-07-10 NOTE — Consult Note (Signed)
   [name] CM Inpatient Consult   07/10/2017  [place] Bills 12-Feb-1961 [id number]    [facility] Care Management folllow up.  Chart reviewed. Remains in stepdown. Will continue to follow for ongoing [place] Management services. Will follow up at bedside at later time.  Inpatient team already aware of [place] Management following.  [name], MSN-Ed, RN,BSN [facility] Liaison [phone]

## 2017-07-10 NOTE — Progress Notes (Signed)
[facility] for warfarin (hold); on IV heparin Indication: atrial fibrillation  No Known Allergies  Labs:  Recent Labs  07/08/17 0338  07/08/17 1540 07/09/17 0443 07/09/17 1630 07/10/17 0224 07/10/17 0455 07/10/17 0456 07/10/17 1245  HGB 8.7*  --   --  8.8*  --   --   --  8.0*  --   HCT 26.7*  --   --  26.8*  --   --   --  24.6*  --   PLT 212  --   --  216  --   --   --  178  --   LABPROT 25.3*  --   --  22.7*  --   --  23.8*  --   --   INR 2.26  --   --  1.97  --   --  2.09  --   --   HEPARINUNFRC  --   --   --   --  0.14* 0.28*  --   --  0.85*  CREATININE  --   < > 3.57* 1.44*  --   --   --  2.97*  --   < > = values in this interval not displayed.  Estimated Creatinine Clearance: 23.9 mL/min (A) (by C-G formula based on SCr of 2.97 mg/dL (H)).   Assessment: Pt on warfarin PTA for afib/MVR (bioprosthetic).  Hgb stable at 8.8. INR this am is 2.09. Heparin started yesterday prior to cardioversion.  Planning to hold Coumadin to place HD access next Monday.  PTA warfarin dose was 2.5 mg daily  Heparin level reported as 0.85 which is above goal range, but patient just returned from HD, and level was drawn from a port on HD catheter, so wonder if could be falsely high from heparin lock or flush, etc.  Goal of Therapy:  INR 2-3  Monitor platelets by anticoagulation protocol: Yes   Plan:  -Hold Coumadin -Confirm heparin level now with peripheral stick. -Daily heparin level and CBC.  Uvaldo Rising, BCPS  Clinical Pharmacist Pager [phone]  07/10/2017 2:06 PM

## 2017-07-10 NOTE — Progress Notes (Signed)
PT Cancellation Note  Patient Details Name: Michael [name] MRN: [id number] DOB: 1961-02-05   Cancelled Treatment:    Reason Eval/Treat Not Completed: (P) Patient declined, no reason specified (Pt reports he is tired from dialysis.  Pt reports he will try later.  PTA informed he we would not check back today and patient agreeable to attempt PT tx on Monday.  Will cancel tc for today.  )   Michael [name] 07/10/2017, 3:30 PM  Michael [name], PTA pager [phone]

## 2017-07-10 NOTE — Procedures (Signed)
I was present at this session.  I have reviewed the session itself and made appropriate changes.  HD via temp cath, tol well. Bps low 100s  Harry Shuck L 8/24/20188:12 AM

## 2017-07-10 NOTE — Anesthesia Postprocedure Evaluation (Signed)
Anesthesia Post Note  Patient: Michael [name]  Procedure(s) Performed: Procedure(s) (LRB): TRANSESOPHAGEAL ECHOCARDIOGRAM (TEE) (N/A) CARDIOVERSION (N/A)     Patient location during evaluation: PACU Anesthesia Type: General Level of consciousness: awake Pain management: pain level controlled Vital Signs Assessment: post-procedure vital signs reviewed and stable Respiratory status: spontaneous breathing Cardiovascular status: stable Postop Assessment: no signs of nausea or vomiting Anesthetic complications: no    Last Vitals:  Vitals:   07/10/17 1126 07/10/17 1236  BP: 95/62 92/73  Pulse: 97 94  Resp: 16 16  Temp: 36.8 C 36.7 C  SpO2: 100% 98%    Last Pain:  Vitals:   07/10/17 1236  TempSrc: Oral  PainSc:    Pain Goal: Patients Stated Pain Goal: 0 (07/06/17 0300)               Emmakate Hypes JR,JOHN Mateo Flow

## 2017-07-10 NOTE — Progress Notes (Signed)
Physical Therapy Cancellation Note   07/10/17 0917  PT Visit Information  Last PT Received On 07/10/17  Reason Eval/Treat Not Completed Patient at procedure or test/unavailable. PT will check on pt later as time allows.    Earney Navy, PTA Pager: [phone]

## 2017-07-10 NOTE — Clinical Social Work Note (Addendum)
CSW met with patient to complete [name] application. [place] interpreter. Patient said he might need it because sometimes he has difficulty understanding. Patient reported that he is not sure if he will need transportation assistance at discharge but his daughter will be here later. Nurse tech had just entered the room and patient asked that CSW come back later.  Dayton Scrape, [place] [phone]  2:28 pm Patient prefers to review [name] application with his daughter. Application put on table. MD's section of the application left on the front of the chart.  Dayton Scrape, [place]

## 2017-07-10 NOTE — Progress Notes (Signed)
ANTICOAGULATION CONSULT NOTE - Follow Up Consult  Pharmacy Consult for Heparin Indication: atrial fibrillation  No Known Allergies  Labs:  Recent Labs  07/08/17 0338  07/08/17 1540 07/09/17 0443  07/10/17 0224 07/10/17 0455 07/10/17 0456 07/10/17 1245 07/10/17 1504  HGB 8.7*  --   --  8.8*  --   --   --  8.0*  --   --   HCT 26.7*  --   --  26.8*  --   --   --  24.6*  --   --   PLT 212  --   --  216  --   --   --  178  --   --   LABPROT 25.3*  --   --  22.7*  --   --  23.8*  --   --   --   INR 2.26  --   --  1.97  --   --  2.09  --   --   --   HEPARINUNFRC  --   --   --   --   < > 0.28*  --   --  0.85* 0.70  CREATININE  --   < > 3.57* 1.44*  --   --   --  2.97*  --   --   < > = values in this interval not displayed.  Estimated Creatinine Clearance: 23.9 mL/min (A) (by C-G formula based on SCr of 2.97 mg/dL (H)).   Medications: Heparin @ 1350 units/hr  Assessment: Pt on warfarin PTA for afib/MVR (bioprosthetic).  Hgb stable at 8.8. INR this am is 2.09. Heparin started yesterday prior to cardioversion.  Planning to hold Coumadin to place HD access next Monday.  PTA warfarin dose was 2.5 mg daily  Heparin level drawn peripherally is therapeutic at the upper end of goal 0.70.   Goal of Therapy:  Heparin level 0.3-0.7 INR 2-3  Monitor platelets by anticoagulation protocol: Yes   Plan:  1) Reduce heparin slightly to 1300 units/hr 2) Follow up daily heparin level and CBC  Nena Jordan, PharmD, BCPS  07/10/2017 4:06 PM

## 2017-07-10 NOTE — Progress Notes (Signed)
ANTICOAGULATION CONSULT NOTE   Pharmacy Consult for warfarin (hold); start Heparin  Indication: atrial fibrillation  No Known Allergies  Labs:  Recent Labs  07/07/17 0330  07/07/17 0755  07/08/17 0338 07/08/17 0353 07/08/17 1540 07/09/17 0443 07/09/17 1630 07/10/17 0224  HGB  --   < > 8.8*  --  8.7*  --   --  8.8*  --   --   HCT  --   --  26.3*  --  26.7*  --   --  26.8*  --   --   PLT  --   --  228  --  212  --   --  216  --   --   LABPROT 28.2*  --   --   --  25.3*  --   --  22.7*  --   --   INR 2.58  --   --   --  2.26  --   --  1.97  --   --   HEPARINUNFRC  --   --   --   --   --   --   --   --  0.14* 0.28*  CREATININE 2.43*  --   --   < >  --  3.02* 3.57* 1.44*  --   --   < > = values in this interval not displayed.  Estimated Creatinine Clearance: 49.2 mL/min (A) (by C-G formula based on SCr of 1.44 mg/dL (H)).   Assessment: Pt on warfarin PTA for afib/MVR (bioprosthetic). On warfarin PTA which is currently on hold (last dose 8/22). Pharmacy consulted to dose IV heparin. Patient is s/p TEE/cardioversion 8/23 and is also for tunneled catheter by [facility] on Monday.   INR 1.97 on 8/23 and heparin level this morning is slightly subtherapeutic at 0.28. No issues with infusion per RN. CBC stable from 8/23 and no s/s bleeding noted.   PTA warfarin dose was 2.5 mg daily  Goal of Therapy:  INR 2-3  Monitor platelets by anticoagulation protocol: Yes   Plan:  Holding warfarin Increase heparin gtt to 1350 units/hr Heparin level in 6 hrs Daily heparin level and CBC Monitor for s/s bleeding   Argie Ramming, PharmD Clinical Pharmacist 07/10/17 3:01 AM

## 2017-07-11 DIAGNOSIS — Z992 Dependence on renal dialysis: Secondary | ICD-10-CM

## 2017-07-11 DIAGNOSIS — N186 End stage renal disease: Secondary | ICD-10-CM

## 2017-07-11 LAB — MAGNESIUM: MAGNESIUM: 2.1 mg/dL (ref 1.7–2.4)

## 2017-07-11 LAB — RENAL FUNCTION PANEL
Albumin: 3 g/dL — ABNORMAL LOW (ref 3.5–5.0)
Anion gap: 9 (ref 5–15)
BUN: 27 mg/dL — ABNORMAL HIGH (ref 6–20)
CHLORIDE: 98 mmol/L — AB (ref 101–111)
CO2: 28 mmol/L (ref 22–32)
CREATININE: 2.43 mg/dL — AB (ref 0.61–1.24)
Calcium: 8.7 mg/dL — ABNORMAL LOW (ref 8.9–10.3)
GFR, EST AFRICAN AMERICAN: 32 mL/min — AB (ref >60)
GFR, EST NON AFRICAN AMERICAN: 28 mL/min — AB (ref >60)
Glucose, Bld: 73 mg/dL (ref 65–99)
PHOSPHORUS: 4.1 mg/dL (ref 2.5–4.6)
Potassium: 4.4 mmol/L (ref 3.5–5.1)
Sodium: 135 mmol/L (ref 135–145)

## 2017-07-11 LAB — CBC
HCT: 24.7 % — ABNORMAL LOW (ref 39.0–52.0)
Hemoglobin: 8 g/dL — ABNORMAL LOW (ref 13.0–17.0)
MCH: 24.8 pg — ABNORMAL LOW (ref 26.0–34.0)
MCHC: 32.4 g/dL (ref 30.0–36.0)
MCV: 76.5 fL — AB (ref 78.0–100.0)
PLATELETS: 152 10*3/uL (ref 150–400)
RBC: 3.23 MIL/uL — ABNORMAL LOW (ref 4.22–5.81)
RDW: 19.7 % — AB (ref 11.5–15.5)
WBC: 11.7 10*3/uL — ABNORMAL HIGH (ref 4.0–10.5)

## 2017-07-11 LAB — GLUCOSE, CAPILLARY
GLUCOSE-CAPILLARY: 69 mg/dL (ref 65–99)
GLUCOSE-CAPILLARY: 231 mg/dL — AB (ref 65–99)
Glucose-Capillary: 93 mg/dL (ref 65–99)
Glucose-Capillary: 120 mg/dL — ABNORMAL HIGH (ref 65–99)

## 2017-07-11 LAB — PROTIME-INR
INR: 1.7
PROTHROMBIN TIME: 20.2 s — AB (ref 11.4–15.2)

## 2017-07-11 LAB — HEPARIN LEVEL (UNFRACTIONATED): HEPARIN UNFRACTIONATED: 0.5 [IU]/mL (ref 0.30–0.70)

## 2017-07-11 LAB — URIC ACID: URIC ACID, SERUM: 4.3 mg/dL — AB (ref 4.4–7.6)

## 2017-07-11 MED ORDER — PREDNISONE 20 MG PO TABS
40.0000 mg | ORAL_TABLET | Freq: Every day | ORAL | Status: AC
  Administered 2017-07-11 – 2017-07-14 (×3): 40 mg via ORAL
  Filled 2017-07-11 (×3): qty 2

## 2017-07-11 NOTE — Progress Notes (Signed)
CSW attempted to follow up with pt's daughter Ilona Sorrel about [name] application. No answer and unable to leave VM due to mailbox being full. CSW will try to reach out again.    Virgie Dad Laurel Harnden, MSW, [facility] Clinical Social Worker [phone]

## 2017-07-11 NOTE — Progress Notes (Signed)
Patient ID: Michael [name], male   DOB: 05/13/1961, 56 y.o.   MRN: [id number]     Advanced Heart Failure Rounding Note  Primary Cardiologist: Dr. Harrington Challenger HF Cardiology: Aundra Dubin  Subjective:    Started CVVHD 8/15 and stopped milrinone. CVVH stopped 8/21. Had HD on 8/22 and 8/24.  On 8/23, he had TEE-guided DCCV to NSR.  Reverted back to AF 8/24  Sitting in chair. Feels fine. Only complaint is R foot pain. Prednisone recently stopped for gout. Denies SOB. Remains in AF rate 100-110  Studies: Echo (7/18): EF 50-55%, mild LVH, mild AI, bioprosthetic mitral valve appeared to function normally, RV moderately dilated with moderately decreased systolic function, D-shaped interventricular septum suggestive of RV pressure/volume overload, severe TR, PASP 54 mmHg  RHC Procedural Findings: Hemodynamics (mmHg) RA mean 27 RV 63/8, mean 20 PA 58/28, mean 40 PCWP mean 28 Oxygen saturations: PA 69% AO 96% Cardiac Output (Fick) 9.82  Cardiac Index (Fick) 4.96 PVR 1.2 WU  TEE (8/23): Normal LV size and systolic function, EF 77-93%.  Normal wall thickness and motion.  D-shaped interventricular septum suggestive of RV pressure/volume overload.  The RV was severely dilated with moderately decreased systolic function.  Severe LAE, no thrombus seen.  The LA appendage appears to have been ligated.  Moderate right atrial enlargement.  No PFO/ASD by color doppler.  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  Peak RV-RA gradient 25 mmHg.  Status post mitral valve replacement with bioprosthetic valve.  Trivial MR.  Mean gradient 6 mmHg across valve, does not appear to have significant stenosis.  Trileaflet aortic valve with moderate aortic regurgitation, no significant stenosis.  Normal caliber aorta with grade III plaque in descending thoracic aorta.   Objective:   Weight Range: 72 kg (158 lb 11.7 oz) Body mass index is 26.41 kg/m.   Vital Signs:   Temp:  [98.1 F (36.7 C)-100.1 F (37.8 C)]  100.1 F (37.8 C) (08/25 0823) Pulse Rate:  [48-99] 98 (08/25 0823) Resp:  [12-20] 12 (08/25 0823) BP: (88-112)/(56-75) 112/68 (08/25 0823) SpO2:  [97 %-100 %] 99 % (08/25 0823) Last BM Date: 07/09/17  Weight change: Filed Weights   07/10/17 0450 07/10/17 0712 07/10/17 1126  Weight: 73.6 kg (162 lb 4.1 oz) 73.6 kg (162 lb 4.1 oz) 72 kg (158 lb 11.7 oz)    Intake/Output:   Intake/Output Summary (Last 24 hours) at 07/11/17 1143 Last data filed at 07/11/17 0900  Gross per 24 hour  Intake           796.12 ml  Output              380 ml  Net           416.12 ml      Physical Exam   General:  Sitting in chair No resp difficulty HEENT: normal Neck: supple. JVP to jaw. Dialysis cath. Carotids 2+ bilat; no bruits. No lymphadenopathy or thryomegaly appreciated. Cor: PMI nondisplaced.IRR IRR tachy 2/6 TR + RV lift Lungs: clear Abdomen: obese soft, nontender, nondistended. No hepatosplenomegaly. No bruits or masses. Good bowel sounds. Extremities: no cyanosis, clubbing, rash mild edema Neuro: alert & orientedx3, cranial nerves grossly intact. moves all 4 extremities w/o difficulty. Affect pleasant   Telemetry  Personally reviewed. Atrial fibrillation rate 100-110  Labs    CBC  Recent Labs  07/10/17 0456 07/11/17 0500  WBC 11.7* 11.7*  HGB 8.0* 8.0*  HCT 24.6* 24.7*  MCV 75.5* 76.5*  PLT 178 152  Basic Metabolic Panel  Recent Labs  07/10/17 0455 07/10/17 0456 07/11/17 0500  NA  --  133* 135  K  --  3.7 4.4  CL  --  95* 98*  CO2  --  27 28  GLUCOSE  --  87 73  BUN  --  35* 27*  CREATININE  --  2.97* 2.43*  CALCIUM  --  8.7* 8.7*  MG 2.0  --  2.1  PHOS  --  4.2 4.1   Liver Function Tests  Recent Labs  07/10/17 0456 07/11/17 0500  ALBUMIN 3.0* 3.0*   No results for input(s): LIPASE, AMYLASE in the last 72 hours. Cardiac Enzymes No results for input(s): CKTOTAL, CKMB, CKMBINDEX, TROPONINI in the last 72 hours.  BNP: BNP (last 3 results)  Recent  Labs  10/05/16 0147 05/30/17 1208 06/25/17 1253  BNP 237.4* 1,404.9* 875.7*    ProBNP (last 3 results)  Recent Labs  03/16/17 1401  PROBNP 6,530*        Imaging   Transthoracic Echocardiography 06/01/17  Study Conclusions  - Left ventricle: The cavity size was normal. There was mild   concentric hypertrophy. Systolic function was normal. The   estimated ejection fraction was in the range of 50% to 55%. Wall   motion was normal; there were no regional wall motion   abnormalities. - Ventricular septum: The contour showed diastolic flattening. - Aortic valve: Transvalvular velocity was minimally increased.   There was no stenosis. There was mild regurgitation. Peak   velocity (S): 201 cm/s. - Mitral valve: A bioprosthesis was present and functioning   normally. Mean gradient (D): 6 mm Hg. Valve area by pressure   half-time: 1.93 cm^2. - Left atrium: The atrium was severely dilated. - Right ventricle: The cavity size was moderately dilated. Wall   thickness was normal. Systolic function was moderately reduced. - Right atrium: The atrium was moderately dilated. - Tricuspid valve: There was severe regurgitation. - Pulmonary arteries: Systolic pressure was moderately increased.   PA peak pressure: 54 mm Hg (S).   RIGHT HEART CATH 06/29/17 RA mean 27 RV 63/8, mean 20 PA 58/28, mean 40 PCWP mean 28  Oxygen saturations: PA 69% AO 96%  Cardiac Output (Fick) 9.82  Cardiac Index (Fick) 4.96 PVR 1.2 WU     Medications:     Scheduled Medications: . amiodarone  400 mg Oral BID  . atorvastatin  20 mg Oral Daily  . darbepoetin (ARANESP) injection - NON-DIALYSIS  200 mcg Subcutaneous Q Sat-1800  . insulin aspart  0-15 Units Subcutaneous TID WC  . insulin aspart  0-5 Units Subcutaneous QHS  . insulin aspart  4 Units Subcutaneous TID WC  . insulin detemir  10 Units Subcutaneous Q2200  . pantoprazole  40 mg Oral Daily  . predniSONE  40 mg Oral Q breakfast  .  sodium chloride  2 spray Each Nare BID  . sodium chloride flush  10-40 mL Intracatheter Q12H  . sodium chloride flush  3 mL Intravenous Q12H  . sodium chloride flush  3 mL Intravenous Q12H    Infusions: . sodium chloride Stopped (07/01/17 1500)  . sodium chloride Stopped (07/06/17 0814)  . sodium chloride    . 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 Patient underwent mitral valve replacement and tricuspid valve repair in [place] in 2012.    Assessment/Plan   1. Acute on chronic diastolic CHF/RV failure: EF 50-55% on echo in 7/18 with prominent RV failure (enlarged/hypokinetic RV with D-shaped interventricular septum), severe TR.  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  Milrinone was started at admission for RV support, stopped now that he is getting RRT.  CVVH stopped 8/21 and had HD session on 8/22 and 8/2.   - Volume control with HD.  Tolerating well - Coreg stopped with low BP in HD.   2. AKI on CKD stage IV: Now getting RRT and off milrinone.  He is going to require long-term HD, significant RV failure will complicate this but he has done well with it so far.  He lives alone and doesn't have much help with his healthcare so chance at heart-kidney transplant would be very low.  - [facility] has seen, will get AVF and tunneled catheter on  Monday. Off coumadin. Remains on heparin. 3. Atrial fibrillation: Persistent.  Has been in atrial fibrillation for a number of months it appears.   I was not sure how long he could stay in NSR with DCCV given severe TR and RV failure (even with amiodarone), but given the severity of his HF, I did a TEE-guided DCCV on 8/23.  However, he is back in atrial fibrillation today with HR 100-110.  - Agree with Dr. Aundra Dubin would not re-attempt DCCV at this point.  Failed carvedilol duet o hypotension. Given elevated rate will continue amio for rate control  Currently. - On heparin gtt and off warfarin for access placement on Monday. D/w PharmD 4. Anemia: Had small amount of BRBPR last week. Now stopped.  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  - Hgb stable.  - Started on ESA per renal 5. Bioprosthetic mitral valve: Stable appearance on 7/18 echo.  No change.  6. Probable sleep apnea:  - Will need sleep study outpatient. No change.    7. Acute gout in R foot - Restart prednisone  Length of Stay: 14  Glori Bickers, MD  07/11/2017, 11:42 AM  Advanced Heart Failure Team Pager [phone] (M-F; 7a - 4p)  Please contact [facility] for night-coverage after hours (4p -7a ) and weekends on [email or web address]

## 2017-07-11 NOTE — Progress Notes (Signed)
Subjective: Interval History: has complaints foot hurts on R.  Objective: Vital signs in last 24 hours: Temp:  [98.1 F (36.7 C)-99.4 F (37.4 C)] 98.3 F (36.8 C) (08/25 0406) Pulse Rate:  [48-99] 99 (08/25 0600) Resp:  [13-21] 19 (08/25 0600) BP: (82-123)/(51-81) 101/72 (08/25 0600) SpO2:  [97 %-100 %] 98 % (08/25 0600) Weight:  [72 kg (158 lb 11.7 oz)] 72 kg (158 lb 11.7 oz) (08/24 1126) Weight change: 0 kg (0 lb)  Intake/Output from previous day: 08/24 0701 - 08/25 0700 In: 410.1 [P.O.:250; I.V.:160.1] Out: 1000  Intake/Output this shift: No intake/output data recorded.  General appearance: alert, cooperative and no distress Neck: RIJ cath Resp: clear to auscultation bilaterally Cardio: irregularly irregular rhythm and systolic murmur: systolic ejection 2/6, decrescendo at 2nd left intercostal space GI: soft, liver down 5 cm Extremities: R foot tender and slightly warm at t-MT area, no swelling or dyscoloration, and good ROM  Lab Results:  Recent Labs  07/10/17 0456 07/11/17 0500  WBC 11.7* 11.7*  HGB 8.0* 8.0*  HCT 24.6* 24.7*  PLT 178 152   BMET:  Recent Labs  07/10/17 0456 07/11/17 0500  NA 133* 135  K 3.7 4.4  CL 95* 98*  CO2 27 28  GLUCOSE 87 73  BUN 35* 27*  CREATININE 2.97* 2.43*  CALCIUM 8.7* 8.7*   No results for input(s): PTH in the last 72 hours. Iron Studies: No results for input(s): IRON, TIBC, TRANSFERRIN, FERRITIN in the last 72 hours.  Studies/Results: No results found.  I have reviewed the patient's current medications.  Assessment/Plan: 1 ESRD for Access. HD yest.  2 Anemia esa/Fe 3 HPTH 4 DM controlled 5 CM stable 6 Afib 7 Foot ??cause, not classic gout, check Uric acid P HD Mon, access,  Check Uric acid    LOS: 14 days   Ancel Easler L 07/11/2017,7:44 AM

## 2017-07-11 NOTE — Progress Notes (Signed)
ANTICOAGULATION CONSULT NOTE - Follow Up Consult  Pharmacy Consult for Heparin Indication: atrial fibrillation  No Known Allergies  Labs:  Recent Labs  07/09/17 0443  07/10/17 0455 07/10/17 0456 07/10/17 1245 07/10/17 1504 07/11/17 0500  HGB 8.8*  --   --  8.0*  --   --  8.0*  HCT 26.8*  --   --  24.6*  --   --  24.7*  PLT 216  --   --  178  --   --  152  LABPROT 22.7*  --  23.8*  --   --   --  20.2*  INR 1.97  --  2.09  --   --   --  1.70  HEPARINUNFRC  --   < >  --   --  0.85* 0.70 0.50  CREATININE 1.44*  --   --  2.97*  --   --  2.43*  < > = values in this interval not displayed.  Estimated Creatinine Clearance: 29.2 mL/min (A) (by C-G formula based on SCr of 2.43 mg/dL (H)).     Assessment: Pt on warfarin PTA for afib/MVR (bioprosthetic).  Hgb stable at 8.8. INR this am 1.7 - holding warfarin for HD access placement Monday. Heparin started yesterday prior to cardioversion Afib > SR now back in Afib CVR 100s on amiodarone. Heparin drip 1300 uts/hr HL 0.5 at goal  PTA warfarin dose was 2.5 mg daily  .   Goal of Therapy:  Heparin level 0.3-0.7 INR 2-3  Monitor platelets by anticoagulation protocol: Yes   Plan:  Continue Heparin 1300 uts/hr Daily HL, CBC, Protime F/u restart warfarin post HD access placement  [facility] Pharm.D. CPP, BCPS Clinical Pharmacist [phone] 07/11/2017 11:27 AM

## 2017-07-12 LAB — HEPARIN LEVEL (UNFRACTIONATED): Heparin Unfractionated: 0.59 IU/mL (ref 0.30–0.70)

## 2017-07-12 LAB — GLUCOSE, CAPILLARY
GLUCOSE-CAPILLARY: 156 mg/dL — AB (ref 65–99)
GLUCOSE-CAPILLARY: 167 mg/dL — AB (ref 65–99)
Glucose-Capillary: 89 mg/dL (ref 65–99)
Glucose-Capillary: 134 mg/dL — ABNORMAL HIGH (ref 65–99)

## 2017-07-12 LAB — CBC
HCT: 24.9 % — ABNORMAL LOW (ref 39.0–52.0)
Hemoglobin: 8.2 g/dL — ABNORMAL LOW (ref 13.0–17.0)
MCH: 25.5 pg — AB (ref 26.0–34.0)
MCHC: 32.9 g/dL (ref 30.0–36.0)
MCV: 77.6 fL — ABNORMAL LOW (ref 78.0–100.0)
PLATELETS: 135 10*3/uL — AB (ref 150–400)
RBC: 3.21 MIL/uL — ABNORMAL LOW (ref 4.22–5.81)
RDW: 20.4 % — AB (ref 11.5–15.5)
WBC: 9.8 10*3/uL (ref 4.0–10.5)

## 2017-07-12 LAB — PROTIME-INR
INR: 1.45
Prothrombin Time: 17.8 seconds — ABNORMAL HIGH (ref 11.4–15.2)

## 2017-07-12 MED ORDER — PREDNISONE 20 MG PO TABS: 20.0000 mg | ORAL_TABLET | Freq: Every day | ORAL | Status: DC

## 2017-07-12 MED ORDER — SENNOSIDES-DOCUSATE SODIUM 8.6-50 MG PO TABS
2.0000 | ORAL_TABLET | Freq: Once | ORAL | Status: AC
  Administered 2017-07-12: 2 via ORAL
  Filled 2017-07-12: qty 2

## 2017-07-12 MED ORDER — INSULIN DETEMIR 100 UNIT/ML ~~LOC~~ SOLN
8.0000 [IU] | Freq: Every day | SUBCUTANEOUS | Status: DC
  Administered 2017-07-12 – 2017-07-17 (×6): 8 [IU] via SUBCUTANEOUS
  Filled 2017-07-12 (×7): qty 0.08

## 2017-07-12 MED ORDER — CEFUROXIME SODIUM 1.5 G IV SOLR
1.5000 g | INTRAVENOUS | Status: AC
  Administered 2017-07-13: 1.5 g via INTRAVENOUS
  Filled 2017-07-12: qty 1.5

## 2017-07-12 NOTE — Progress Notes (Signed)
ANTICOAGULATION CONSULT NOTE - Follow Up Consult  Pharmacy Consult for Heparin Indication: atrial fibrillation  No Known Allergies  Labs:  Recent Labs  07/10/17 0455  07/10/17 0456  07/10/17 1504 07/11/17 0500 07/12/17 0333  HGB  --   < > 8.0*  --   --  8.0* 8.2*  HCT  --   --  24.6*  --   --  24.7* 24.9*  PLT  --   --  178  --   --  152 135*  LABPROT 23.8*  --   --   --   --  20.2* 17.8*  INR 2.09  --   --   --   --  1.70 1.45  HEPARINUNFRC  --   --   --   < > 0.70 0.50 0.59  CREATININE  --   --  2.97*  --   --  2.43*  --   < > = values in this interval not displayed.  Estimated Creatinine Clearance: 29.2 mL/min (A) (by C-G formula based on SCr of 2.43 mg/dL (H)).     Assessment: Pt on warfarin PTA for afib/MVR (bioprosthetic).  Hgb stable at 8.8. INR this am 1.7 - holding warfarin for HD access placement Monday. Heparin prior to cardioversion Afib > SR now back in Afib CVR 100s on amiodarone. Heparin drip 1300 uts/hr HL 0.59 at goal CBC stable, no bleeding  noted PTA warfarin dose was 2.5 mg daily  .   Goal of Therapy:  Heparin level 0.3-0.7 INR 2-3  Monitor platelets by anticoagulation protocol: Yes   Plan:  Continue Heparin 1300 uts/hr Daily HL, CBC, Protime F/u restart warfarin post HD access placement  [facility] Pharm.D. CPP, BCPS Clinical Pharmacist [phone] 07/12/2017 1:37 PM

## 2017-07-12 NOTE — Progress Notes (Signed)
Patient ID: Michael [name], male   DOB: September 22, 1961, 56 y.o.   MRN: [id number]     Advanced Heart Failure Rounding Note  Primary Cardiologist: Dr. Harrington Challenger HF Cardiology: Aundra Dubin  Subjective:    Started CVVHD 8/15 and stopped milrinone. CVVH stopped 8/21. Had HD on 8/22 and 8/24.  On 8/23, he had TEE-guided DCCV to NSR.  Reverted back to AF 8/24  Sitting in chair. Hungry. Deneis SOB. Anxious to go home. R ankle pain improved with increased prednisone.   Studies: Echo (7/18): EF 50-55%, mild LVH, mild AI, bioprosthetic mitral valve appeared to function normally, RV moderately dilated with moderately decreased systolic function, D-shaped interventricular septum suggestive of RV pressure/volume overload, severe TR, PASP 54 mmHg  [facility] Procedural Findings: Hemodynamics (mmHg) RA mean 27 RV 63/8, mean 20 PA 58/28, mean 40 PCWP mean 28 Oxygen saturations: PA 69% AO 96% Cardiac Output (Fick) 9.82  Cardiac Index (Fick) 4.96 PVR 1.2 WU  TEE (8/23): Normal LV size and systolic function, EF 80-32%.  Normal wall thickness and motion.  D-shaped interventricular septum suggestive of RV pressure/volume overload.  The RV was severely dilated with moderately decreased systolic function.  Severe LAE, no thrombus seen.  The LA appendage appears to have been ligated.  Moderate right atrial enlargement.  No PFO/ASD by color doppler.  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  Peak RV-RA gradient 25 mmHg.  Status post mitral valve replacement with bioprosthetic valve.  Trivial MR.  Mean gradient 6 mmHg across valve, does not appear to have significant stenosis.  Trileaflet aortic valve with moderate aortic regurgitation, no significant stenosis.  Normal caliber aorta with grade III plaque in descending thoracic aorta.   Objective:   Weight Range: 73.2 kg (161 lb 4.8 oz) Body mass index is 26.84 kg/m.   Vital Signs:   Temp:  [97 F (36.1 C)-101.3 F (38.5 C)] 98.5 F (36.9 C) (08/26  1200) Pulse Rate:  [32-128] 73 (08/26 1200) Resp:  [11-18] 18 (08/26 1200) BP: (90-135)/(57-97) 99/62 (08/26 1200) SpO2:  [97 %-100 %] 100 % (08/26 1200) Weight:  [73.2 kg (161 lb 4.8 oz)] 73.2 kg (161 lb 4.8 oz) (08/26 0328) Last BM Date: 07/10/17  Weight change: Filed Weights   07/10/17 0712 07/10/17 1126 07/12/17 0328  Weight: 73.6 kg (162 lb 4.1 oz) 72 kg (158 lb 11.7 oz) 73.2 kg (161 lb 4.8 oz)    Intake/Output:   Intake/Output Summary (Last 24 hours) at 07/12/17 1318 Last data filed at 07/12/17 1200  Gross per 24 hour  Intake           875.78 ml  Output              700 ml  Net           175.78 ml      Physical Exam   General:  Sitting in chair No resp difficulty  HEENT: normal Neck: supple. JVP 8-9 Dialysis cath in RIJ. Carotids 2+ bilat; no bruits. No lymphadenopathy or thryomegaly appreciated. Cor: PMI nondisplaced. IRR IRR RV lift 2/6 TR Lungs: clear  Abdomen: obese NT/ND. No hepatosplenomegaly. No bruits or masses. Good bowel sounds. Extremities: no cyanosis, clubbing, rash trace edema R ankle improved  Neuro: alert & oriented x 3, cranial nerves grossly intact. moves all 4 extremities w/o difficulty. Affect pleasant   Telemetry   Personally reviewed. AF 70s  Labs    CBC  Recent Labs  07/11/17 0500 07/12/17 0333  WBC 11.7* 9.8  HGB 8.0* 8.2*  HCT 24.7* 24.9*  MCV 76.5* 77.6*  PLT 152 562*   Basic Metabolic Panel  Recent Labs  07/10/17 0455 07/10/17 0456 07/11/17 0500  NA  --  133* 135  K  --  3.7 4.4  CL  --  95* 98*  CO2  --  27 28  GLUCOSE  --  87 73  BUN  --  35* 27*  CREATININE  --  2.97* 2.43*  CALCIUM  --  8.7* 8.7*  MG 2.0  --  2.1  PHOS  --  4.2 4.1   Liver Function Tests  Recent Labs  07/10/17 0456 07/11/17 0500  ALBUMIN 3.0* 3.0*   No results for input(s): LIPASE, AMYLASE in the last 72 hours. Cardiac Enzymes No results for input(s): CKTOTAL, CKMB, CKMBINDEX, TROPONINI in the last 72 hours.  BNP: BNP (last 3  results)  Recent Labs  10/05/16 0147 05/30/17 1208 06/25/17 1253  BNP 237.4* 1,404.9* 875.7*    ProBNP (last 3 results)  Recent Labs  03/16/17 1401  PROBNP 6,530*        Imaging   Transthoracic Echocardiography 06/01/17  Study Conclusions  - Left ventricle: The cavity size was normal. There was mild   concentric hypertrophy. Systolic function was normal. The   estimated ejection fraction was in the range of 50% to 55%. Wall   motion was normal; there were no regional wall motion   abnormalities. - Ventricular septum: The contour showed diastolic flattening. - Aortic valve: Transvalvular velocity was minimally increased.   There was no stenosis. There was mild regurgitation. Peak   velocity (S): 201 cm/s. - Mitral valve: A bioprosthesis was present and functioning   normally. Mean gradient (D): 6 mm Hg. Valve area by pressure   half-time: 1.93 cm^2. - Left atrium: The atrium was severely dilated. - Right ventricle: The cavity size was moderately dilated. Wall   thickness was normal. Systolic function was moderately reduced. - Right atrium: The atrium was moderately dilated. - Tricuspid valve: There was severe regurgitation. - Pulmonary arteries: Systolic pressure was moderately increased.   PA peak pressure: 54 mm Hg (S).   RIGHT HEART CATH 06/29/17 RA mean 27 RV 63/8, mean 20 PA 58/28, mean 40 PCWP mean 28  Oxygen saturations: PA 69% AO 96%  Cardiac Output (Fick) 9.82  Cardiac Index (Fick) 4.96 PVR 1.2 WU     Medications:     Scheduled Medications: . amiodarone  400 mg Oral BID  . atorvastatin  20 mg Oral Daily  . darbepoetin (ARANESP) injection - NON-DIALYSIS  200 mcg Subcutaneous Q Sat-1800  . insulin aspart  0-15 Units Subcutaneous TID WC  . insulin aspart  0-5 Units Subcutaneous QHS  . insulin aspart  4 Units Subcutaneous TID WC  . insulin detemir  10 Units Subcutaneous Q2200  . pantoprazole  40 mg Oral Daily  . predniSONE  40 mg Oral Q  breakfast  . sodium chloride  2 spray Each Nare BID  . sodium chloride flush  10-40 mL Intracatheter Q12H  . sodium chloride flush  3 mL Intravenous Q12H    Infusions: . sodium chloride Stopped (07/01/17 1500)  . sodium chloride Stopped (07/06/17 0814)  . [START ON 07/13/2017] cefUROXime (ZINACEF)  IV    . 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 Patient underwent mitral valve replacement and tricuspid valve repair in [place] in 2012.    Assessment/Plan   1. Acute on chronic diastolic CHF/RV failure: EF 50-55% on echo in 7/18 with prominent RV failure (enlarged/hypokinetic RV with D-shaped interventricular septum), severe TR.  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  Milrinone was started at admission for RV support, stopped now that he is getting RRT.  CVVH stopped 8/21 and had HD session on 8/22 and 8/24.   - Volume well controlled on HD. Wil lcontinue  - Coreg stopped with low BP in HD.   2. AKI on CKD stage IV: Now getting RRT and off milrinone.  He is going to require long-term HD, significant RV failure will complicate this but he has done well with it so far.  He lives alone and doesn't have much help with his healthcare so chance at heart-kidney transplant would be very low.  - [facility]VS has seen. For AVF and tunneled catheter tomorrow am. Off coumadin. Remains on  heparin. Discussed with pharmD.  3. Atrial fibrillation: Persistent.  Has been in atrial fibrillation for a number of months it appears.   I was not sure how long he could stay in NSR with DCCV given severe TR and RV failure (even with amiodarone), but given the severity of his HF, I did a TEE-guided DCCV on 8/23.  However, he is back in atrial fibrillation today with HR 100-110.  - Agree with Dr. Aundra Dubin would not re-attempt DCCV at this point.  Failed carvedilol due to hypotension.  - Rate was elevated yesterday in setting of gout pain so amiodarone continued for rte control. Will continue amio through surgery tomorrow then can consider switching to Toprol if BP can tolerate (failed Coreg already) - On heparin gtt and off warfarin for access placement tomorrow. D/w PharmD 4. Anemia: Had small amount of BRBPR last week. Now stopped.  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  - Hgb stable t 8.2  - Started on ESA per renal 5. Bioprosthetic mitral valve: Stable appearance on 7/18 echo.  No change.  6. Probable sleep apnea:  - Will need sleep study outpatient. No change.    7. Acute gout in R foot - Prednisone restarted 8/25 x 3 days  Length of Stay: 15  Glori Bickers, MD  07/12/2017, 1:18 PM  Advanced Heart Failure Team Pager [phone] (M-F; 7a - 4p)  Please contact [facility] for night-coverage after hours (4p -7a ) and weekends on [email or web address]

## 2017-07-12 NOTE — Progress Notes (Signed)
Subjective: Interval History: has no complaint , still making some urine.  Objective: Vital signs in last 24 hours: Temp:  [97.6 F (36.4 C)-101.3 F (38.5 C)] 97.6 F (36.4 C) (08/26 0328) Pulse Rate:  [32-128] 72 (08/26 0328) Resp:  [11-19] 18 (08/26 0328) BP: (90-135)/(57-97) 114/70 (08/26 0328) SpO2:  [97 %-100 %] 100 % (08/26 0328) Weight:  [73.2 kg (161 lb 4.8 oz)] 73.2 kg (161 lb 4.8 oz) (08/26 0328) Weight change: -0.435 kg (-15.3 oz)  Intake/Output from previous day: 08/25 0701 - 08/26 0700 In: 953.8 [P.O.:720; I.V.:233.8] Out: 680 [Urine:300] Intake/Output this shift: No intake/output data recorded.  General appearance: alert, cooperative and no distress Neck: IJ cath Resp: rales bibasilar Cardio: irregularly irregular rhythm and systolic murmur: holosystolic 2/6, blowing at apex GI: pos bs, liver down 5 cm, soft Extremities: edema tr  Lab Results:  Recent Labs  07/11/17 0500 07/12/17 0333  WBC 11.7* 9.8  HGB 8.0* 8.2*  HCT 24.7* 24.9*  PLT 152 135*   BMET:  Recent Labs  07/10/17 0456 07/11/17 0500  NA 133* 135  K 3.7 4.4  CL 95* 98*  CO2 27 28  GLUCOSE 87 73  BUN 35* 27*  CREATININE 2.97* 2.43*  CALCIUM 8.7* 8.7*   No results for input(s): PTH in the last 72 hours. Iron Studies: No results for input(s): IRON, TIBC, TRANSFERRIN, FERRITIN in the last 72 hours.  Studies/Results: No results found.  I have reviewed the patient's current medications.  Assessment/Plan: 1 ESRD for Hd, access tomorrow.  Counseled. CLIP 2 Anemia esa/Fe 3 MVR/CM stable 4 Afib 5 DM controlled 6 HPTH P HD, access, esa, CLIP    LOS: 15 days   Madie Cahn L 07/12/2017,7:36 AM

## 2017-07-13 ENCOUNTER — Inpatient Hospital Stay (HOSPITAL_COMMUNITY): Payer: Medicare Other

## 2017-07-13 ENCOUNTER — Encounter (HOSPITAL_COMMUNITY)
Admission: EM | Disposition: A | Discharge status: Home Health | Payer: Self-pay | Source: Home / Self Care | Attending: Cardiology

## 2017-07-13 ENCOUNTER — Inpatient Hospital Stay (HOSPITAL_COMMUNITY): Payer: Medicare Other | Admitting: Certified Registered Nurse Anesthetist

## 2017-07-13 ENCOUNTER — Encounter (HOSPITAL_COMMUNITY): Payer: Self-pay | Admitting: Certified Registered Nurse Anesthetist

## 2017-07-13 ENCOUNTER — Ambulatory Visit: Payer: Medicare Other | Admitting: Internal Medicine

## 2017-07-13 DIAGNOSIS — K7460 Unspecified cirrhosis of liver: Secondary | ICD-10-CM

## 2017-07-13 DIAGNOSIS — Z7901 Long term (current) use of anticoagulants: Secondary | ICD-10-CM

## 2017-07-13 DIAGNOSIS — D696 Thrombocytopenia, unspecified: Secondary | ICD-10-CM

## 2017-07-13 DIAGNOSIS — N185 Chronic kidney disease, stage 5: Secondary | ICD-10-CM

## 2017-07-13 HISTORY — PX: INSERTION OF DIALYSIS CATHETER: SHX1324

## 2017-07-13 HISTORY — PX: AV FISTULA PLACEMENT: SHX1204

## 2017-07-13 LAB — CBC
HCT: 20.8 % — ABNORMAL LOW (ref 39.0–52.0)
HCT: 24.2 % — ABNORMAL LOW (ref 39.0–52.0)
HEMATOCRIT: 23.8 % — AB (ref 39.0–52.0)
HEMOGLOBIN: 7.8 g/dL — AB (ref 13.0–17.0)
HEMOGLOBIN: 7.9 g/dL — AB (ref 13.0–17.0)
Hemoglobin: 7 g/dL — ABNORMAL LOW (ref 13.0–17.0)
MCH: 25.6 pg — ABNORMAL LOW (ref 26.0–34.0)
MCH: 25.5 pg — AB (ref 26.0–34.0)
MCH: 26.2 pg (ref 26.0–34.0)
MCHC: 33.7 g/dL (ref 30.0–36.0)
MCHC: 32.2 g/dL (ref 30.0–36.0)
MCHC: 33.2 g/dL (ref 30.0–36.0)
MCV: 76.2 fL — ABNORMAL LOW (ref 78.0–100.0)
MCV: 79.1 fL (ref 78.0–100.0)
MCV: 79.1 fL (ref 78.0–100.0)
PLATELETS: 138 10*3/uL — AB (ref 150–400)
PLATELETS: 146 10*3/uL — AB (ref 150–400)
Platelets: 144 10*3/uL — ABNORMAL LOW (ref 150–400)
RBC: 2.73 MIL/uL — AB (ref 4.22–5.81)
RBC: 3.06 MIL/uL — AB (ref 4.22–5.81)
RBC: 3.01 MIL/uL — AB (ref 4.22–5.81)
RDW: 20.4 % — AB (ref 11.5–15.5)
RDW: 20.8 % — ABNORMAL HIGH (ref 11.5–15.5)
RDW: 20.7 % — ABNORMAL HIGH (ref 11.5–15.5)
WBC: 9.7 10*3/uL (ref 4.0–10.5)
WBC: 9.1 10*3/uL (ref 4.0–10.5)
WBC: 8.2 10*3/uL (ref 4.0–10.5)

## 2017-07-13 LAB — RENAL FUNCTION PANEL
Albumin: 3 g/dL — ABNORMAL LOW (ref 3.5–5.0)
Anion gap: 11 (ref 5–15)
BUN: 61 mg/dL — ABNORMAL HIGH (ref 6–20)
CHLORIDE: 95 mmol/L — AB (ref 101–111)
CO2: 23 mmol/L (ref 22–32)
CREATININE: 3.44 mg/dL — AB (ref 0.61–1.24)
Calcium: 9 mg/dL (ref 8.9–10.3)
GFR calc non Af Amer: 18 mL/min — ABNORMAL LOW (ref >60)
GFR, EST AFRICAN AMERICAN: 21 mL/min — AB (ref >60)
Glucose, Bld: 95 mg/dL (ref 65–99)
PHOSPHORUS: 6 mg/dL — AB (ref 2.5–4.6)
POTASSIUM: 5 mmol/L (ref 3.5–5.1)
Sodium: 129 mmol/L — ABNORMAL LOW (ref 135–145)

## 2017-07-13 LAB — PROTIME-INR
INR: 1.41
PROTHROMBIN TIME: 17.4 s — AB (ref 11.4–15.2)

## 2017-07-13 LAB — GLUCOSE, CAPILLARY
GLUCOSE-CAPILLARY: 124 mg/dL — AB (ref 65–99)
GLUCOSE-CAPILLARY: 105 mg/dL — AB (ref 65–99)
Glucose-Capillary: 93 mg/dL (ref 65–99)
Glucose-Capillary: 90 mg/dL (ref 65–99)
Glucose-Capillary: 93 mg/dL (ref 65–99)

## 2017-07-13 LAB — PREPARE RBC (CROSSMATCH)

## 2017-07-13 LAB — HEPARIN LEVEL (UNFRACTIONATED): Heparin Unfractionated: 0.38 IU/mL (ref 0.30–0.70)

## 2017-07-13 SURGERY — ARTERIOVENOUS (AV) FISTULA CREATION
Anesthesia: General | Site: Neck | Laterality: Right

## 2017-07-13 MED ORDER — 0.9 % SODIUM CHLORIDE (POUR BTL) OPTIME
TOPICAL | Status: DC | PRN
  Administered 2017-07-13: 1000 mL

## 2017-07-13 MED ORDER — PROPOFOL 10 MG/ML IV BOLUS
INTRAVENOUS | Status: AC
  Filled 2017-07-13: qty 20

## 2017-07-13 MED ORDER — HEPARIN SODIUM (PORCINE) 1000 UNIT/ML IJ SOLN
INTRAMUSCULAR | Status: DC | PRN
  Administered 2017-07-13: 1000 [IU]

## 2017-07-13 MED ORDER — HEPARIN SODIUM (PORCINE) 1000 UNIT/ML DIALYSIS
1000.0000 [IU] | INTRAMUSCULAR | Status: DC | PRN
Start: 2017-07-13 — End: 2017-07-20
  Filled 2017-07-13: qty 1

## 2017-07-13 MED ORDER — DOCUSATE SODIUM 100 MG PO CAPS
100.0000 mg | ORAL_CAPSULE | Freq: Two times a day (BID) | ORAL | Status: DC
  Administered 2017-07-13 – 2017-07-27 (×20): 100 mg via ORAL
  Filled 2017-07-13 (×26): qty 1

## 2017-07-13 MED ORDER — SODIUM CHLORIDE 0.9 % IV SOLN: 100.0000 mL | INTRAVENOUS | Status: DC | PRN

## 2017-07-13 MED ORDER — LIDOCAINE HCL (PF) 1 % IJ SOLN: 5.0000 mL | INTRAMUSCULAR | Status: DC | PRN

## 2017-07-13 MED ORDER — FENTANYL CITRATE (PF) 100 MCG/2ML IJ SOLN
INTRAMUSCULAR | Status: DC | PRN
  Administered 2017-07-13 (×2): 25 ug via INTRAVENOUS
  Administered 2017-07-13 (×2): 50 ug via INTRAVENOUS

## 2017-07-13 MED ORDER — NEPRO/CARBSTEADY PO LIQD
237.0000 mL | Freq: Every morning | ORAL | Status: DC
Start: 2017-07-13 — End: 2017-07-15
  Filled 2017-07-13 (×4): qty 237

## 2017-07-13 MED ORDER — SODIUM CHLORIDE 0.9 % IV SOLN: Freq: Once | INTRAVENOUS | Status: DC

## 2017-07-13 MED ORDER — EPHEDRINE SULFATE 50 MG/ML IJ SOLN
INTRAMUSCULAR | Status: DC | PRN
  Administered 2017-07-13 (×3): 10 mg via INTRAVENOUS

## 2017-07-13 MED ORDER — PENTAFLUOROPROP-TETRAFLUOROETH EX AERO: 1.0000 "application " | INHALATION_SPRAY | CUTANEOUS | Status: DC | PRN

## 2017-07-13 MED ORDER — HEPARIN SODIUM (PORCINE) 1000 UNIT/ML DIALYSIS: 1000.0000 [IU] | INTRAMUSCULAR | Status: DC | PRN

## 2017-07-13 MED ORDER — ACETAMINOPHEN 160 MG/5ML PO SOLN: 325.0000 mg | ORAL | Status: DC | PRN

## 2017-07-13 MED ORDER — LACTATED RINGERS IV SOLN: INTRAVENOUS | Status: DC

## 2017-07-13 MED ORDER — POLYETHYLENE GLYCOL 3350 17 G PO PACK
17.0000 g | PACK | Freq: Every day | ORAL | Status: DC
  Administered 2017-07-14 – 2017-07-27 (×7): 17 g via ORAL
  Filled 2017-07-13 (×11): qty 1

## 2017-07-13 MED ORDER — FENTANYL CITRATE (PF) 100 MCG/2ML IJ SOLN: 25.0000 ug | INTRAMUSCULAR | Status: DC | PRN

## 2017-07-13 MED ORDER — LIDOCAINE-PRILOCAINE 2.5-2.5 % EX CREA: 1.0000 "application " | TOPICAL_CREAM | CUTANEOUS | Status: DC | PRN

## 2017-07-13 MED ORDER — SODIUM CHLORIDE 0.9 % IV SOLN
INTRAVENOUS | Status: DC | PRN
  Administered 2017-07-13: 10:00:00

## 2017-07-13 MED ORDER — ALTEPLASE 2 MG IJ SOLR: 2.0000 mg | Freq: Once | INTRAMUSCULAR | Status: DC | PRN

## 2017-07-13 MED ORDER — HEPARIN SODIUM (PORCINE) 1000 UNIT/ML DIALYSIS
40.0000 [IU]/kg | Freq: Once | INTRAMUSCULAR | Status: AC
  Administered 2017-07-24: 2900 [IU] via INTRAVENOUS_CENTRAL

## 2017-07-13 MED ORDER — LIDOCAINE-EPINEPHRINE (PF) 1 %-1:200000 IJ SOLN
INTRAMUSCULAR | Status: AC
  Filled 2017-07-13: qty 30

## 2017-07-13 MED ORDER — HEPARIN SODIUM (PORCINE) 1000 UNIT/ML IJ SOLN
INTRAMUSCULAR | Status: AC
  Filled 2017-07-13: qty 1

## 2017-07-13 MED ORDER — PROPOFOL 10 MG/ML IV BOLUS
INTRAVENOUS | Status: DC | PRN
  Administered 2017-07-13: 100 mg via INTRAVENOUS

## 2017-07-13 MED ORDER — ACETAMINOPHEN 325 MG PO TABS: 325.0000 mg | ORAL_TABLET | ORAL | Status: DC | PRN

## 2017-07-13 MED ORDER — ONDANSETRON HCL 4 MG/2ML IJ SOLN
INTRAMUSCULAR | Status: DC | PRN
  Administered 2017-07-13: 4 mg via INTRAVENOUS

## 2017-07-13 MED ORDER — FENTANYL CITRATE (PF) 250 MCG/5ML IJ SOLN
INTRAMUSCULAR | Status: AC
  Filled 2017-07-13: qty 5

## 2017-07-13 MED ORDER — ONDANSETRON HCL 4 MG/2ML IJ SOLN
INTRAMUSCULAR | Status: AC
  Filled 2017-07-13: qty 2

## 2017-07-13 MED ORDER — HEPARIN SODIUM (PORCINE) 1000 UNIT/ML DIALYSIS
100.0000 [IU]/kg | INTRAMUSCULAR | Status: DC | PRN
  Filled 2017-07-13: qty 7

## 2017-07-13 MED ORDER — FENTANYL CITRATE (PF) 100 MCG/2ML IJ SOLN
50.0000 ug | Freq: Once | INTRAMUSCULAR | Status: AC
  Administered 2017-07-13: 50 ug via INTRAVENOUS
  Filled 2017-07-13: qty 2

## 2017-07-13 MED ORDER — PHENYLEPHRINE HCL 10 MG/ML IJ SOLN
INTRAMUSCULAR | Status: DC | PRN
  Administered 2017-07-13: 120 ug via INTRAVENOUS

## 2017-07-13 MED ORDER — LIDOCAINE HCL (CARDIAC) 20 MG/ML IV SOLN
INTRAVENOUS | Status: DC | PRN
  Administered 2017-07-13: 100 mg via INTRAVENOUS

## 2017-07-13 MED ORDER — HEPARIN SODIUM (PORCINE) 1000 UNIT/ML DIALYSIS: 100.0000 [IU]/kg | INTRAMUSCULAR | Status: DC | PRN

## 2017-07-13 MED ORDER — HEMOSTATIC AGENTS (NO CHARGE) OPTIME
TOPICAL | Status: DC | PRN
  Administered 2017-07-13: 1 via TOPICAL

## 2017-07-13 MED ORDER — PHENYLEPHRINE 40 MCG/ML (10ML) SYRINGE FOR IV PUSH (FOR BLOOD PRESSURE SUPPORT)
PREFILLED_SYRINGE | INTRAVENOUS | Status: AC
  Filled 2017-07-13: qty 10

## 2017-07-13 MED ORDER — ONDANSETRON HCL 4 MG/2ML IJ SOLN: 4.0000 mg | Freq: Once | INTRAMUSCULAR | Status: DC | PRN

## 2017-07-13 SURGICAL SUPPLY — 42 items
ADH SKN CLS APL DERMABOND .7 (GAUZE/BANDAGES/DRESSINGS) ×6
ARMBAND PINK RESTRICT EXTREMIT (MISCELLANEOUS) ×4 IMPLANT
BIOPATCH RED 1 DISK 7.0 (GAUZE/BANDAGES/DRESSINGS) ×1 IMPLANT
BIOPATCH RED 1IN DISK 7.0MM (GAUZE/BANDAGES/DRESSINGS) ×1
CANISTER SUCT 3000ML PPV (MISCELLANEOUS) ×4 IMPLANT
CATH PALINDROME RT-P 15FX19CM (CATHETERS) ×2 IMPLANT
CLIP VESOCCLUDE MED 6/CT (CLIP) ×4 IMPLANT
CLIP VESOCCLUDE SM WIDE 6/CT (CLIP) ×4 IMPLANT
COVER PROBE W GEL 5X96 (DRAPES) ×4 IMPLANT
DERMABOND ADVANCED (GAUZE/BANDAGES/DRESSINGS) ×6
DERMABOND ADVANCED .7 DNX12 (GAUZE/BANDAGES/DRESSINGS) ×2 IMPLANT
DRAPE CHEST BREAST 15X10 FENES (DRAPES) ×2 IMPLANT
ELECT REM PT RETURN 9FT ADLT (ELECTROSURGICAL) ×4
ELECTRODE REM PT RTRN 9FT ADLT (ELECTROSURGICAL) ×2 IMPLANT
GLOVE BIO SURGEON STRL SZ 6.5 (GLOVE) ×1 IMPLANT
GLOVE BIO SURGEON STRL SZ7.5 (GLOVE) ×6 IMPLANT
GLOVE BIO SURGEONS STRL SZ 6.5 (GLOVE) ×1
GLOVE BIOGEL PI IND STRL 6.5 (GLOVE) IMPLANT
GLOVE BIOGEL PI IND STRL 7.0 (GLOVE) IMPLANT
GLOVE BIOGEL PI INDICATOR 6.5 (GLOVE) ×6
GLOVE BIOGEL PI INDICATOR 7.0 (GLOVE) ×2
GOWN STRL REUS W/ TWL LRG LVL3 (GOWN DISPOSABLE) ×4 IMPLANT
GOWN STRL REUS W/ TWL XL LVL3 (GOWN DISPOSABLE) ×2 IMPLANT
GOWN STRL REUS W/TWL LRG LVL3 (GOWN DISPOSABLE) ×4
GOWN STRL REUS W/TWL XL LVL3 (GOWN DISPOSABLE) ×16
KIT BASIN OR (CUSTOM PROCEDURE TRAY) ×4 IMPLANT
KIT ROOM TURNOVER OR (KITS) ×4 IMPLANT
NDL 18GX1X1/2 (RX/OR ONLY) (NEEDLE) IMPLANT
NEEDLE 18GX1X1/2 (RX/OR ONLY) (NEEDLE) ×4 IMPLANT
NS IRRIG 1000ML POUR BTL (IV SOLUTION) ×4 IMPLANT
PACK CV ACCESS (CUSTOM PROCEDURE TRAY) ×4 IMPLANT
PAD ARMBOARD 7.5X6 YLW CONV (MISCELLANEOUS) ×8 IMPLANT
SOAP 2 % CHG 4 OZ (WOUND CARE) ×2 IMPLANT
SUT ETHILON 3 0 PS 1 (SUTURE) ×2 IMPLANT
SUT MNCRL AB 4-0 PS2 18 (SUTURE) ×6 IMPLANT
SUT PROLENE 6 0 BV (SUTURE) ×6 IMPLANT
SUT VIC AB 3-0 SH 27 (SUTURE) ×8
SUT VIC AB 3-0 SH 27X BRD (SUTURE) ×2 IMPLANT
SYR 5ML LL (SYRINGE) ×2 IMPLANT
TOWEL NATURAL 6PK STERILE (DISPOSABLE) ×2 IMPLANT
UNDERPAD 30X30 (UNDERPADS AND DIAPERS) ×4 IMPLANT
WATER STERILE IRR 1000ML POUR (IV SOLUTION) ×4 IMPLANT

## 2017-07-13 NOTE — Anesthesia Preprocedure Evaluation (Signed)
Anesthesia Evaluation  Patient identified by MRN, date of birth, ID band Patient awake    Reviewed: Allergy & Precautions, NPO status , Patient's Chart, lab work & pertinent test results  Airway Mallampati: II  TM Distance: >3 FB Neck ROM: Full    Dental no notable dental hx.    Pulmonary sleep apnea , Current Smoker,    Pulmonary exam normal breath sounds clear to auscultation       Cardiovascular hypertension, +CHF  Normal cardiovascular exam+ dysrhythmias Atrial Fibrillation  Rhythm:Regular Rate:Normal  S/p MVR   Neuro/Psych negative neurological ROS  negative psych ROS   GI/Hepatic negative GI ROS, Neg liver ROS,   Endo/Other  diabetes, Type 2  Renal/GU ESRF and DialysisRenal disease  negative genitourinary   Musculoskeletal negative musculoskeletal ROS (+)   Abdominal   Peds negative pediatric ROS (+)  Hematology negative hematology ROS (+)   Anesthesia Other Findings   Reproductive/Obstetrics negative OB ROS                            Anesthesia Physical Anesthesia Plan  ASA: IV  Anesthesia Plan: General and MAC   Post-op Pain Management:    Induction: Intravenous  PONV Risk Score and Plan: 1 and Ondansetron  Airway Management Planned:   Additional Equipment:   Intra-op Plan:   Post-operative Plan:   Informed Consent: I have reviewed the patients History and Physical, chart, labs and discussed the procedure including the risks, benefits and alternatives for the proposed anesthesia with the patient or authorized representative who has indicated his/her understanding and acceptance.   Dental advisory given  Plan Discussed with: CRNA  Anesthesia Plan Comments:         Anesthesia Quick Evaluation

## 2017-07-13 NOTE — Anesthesia Postprocedure Evaluation (Signed)
Anesthesia Post Note  Patient: Michael [name]  Procedure(s) Performed: Procedure(s) (LRB): LEFT ARM ARTERIOVENOUS (AV) FISTULA CREATION (Left) INSERTION OF DIALYSIS CATHETER RIGHT INTERNAL JUGULAR (Right)     Patient location during evaluation: PACU Anesthesia Type: General Level of consciousness: awake and alert Pain management: pain level controlled Vital Signs Assessment: post-procedure vital signs reviewed and stable Respiratory status: spontaneous breathing, nonlabored ventilation, respiratory function stable and patient connected to nasal cannula oxygen Cardiovascular status: blood pressure returned to baseline and stable Postop Assessment: no signs of nausea or vomiting Anesthetic complications: no    Last Vitals:  Vitals:   07/13/17 1330 07/13/17 1400  BP: 100/73 97/64  Pulse: 82 78  Resp: 17 18  Temp:    SpO2:      Last Pain:  Vitals:   07/13/17 1245  TempSrc: Oral  PainSc:                  Montez Hageman

## 2017-07-13 NOTE — Progress Notes (Signed)
Patient continues to ask for food and ice constantly. I have educated the patient several times about fluid restrictions and that he is diabetic. Patient often requests sugar packets to put in his water and on food. Patient hasn't shown any signs of understanding the importance of either. Patient was given a Healthy choice meal around 2130 since he will be NPO after midnight. Patient continues to ask everybody that walks by his door and calls to front desk for more food and graham crackers constantly. Patient has had at least 8 packs of graham crackers on previous shift as evidenced by the empty wrappers on bedside table. Patient would benefit from a healthy renal snack at bedtime and double portions of appropriative food choices.  Patient keeps saying, "but I'm hungry" when I try to educate him on why he can't eat certain foods. I feel this patient will need placement after discharge to be compliant with diet and medications and remain out of the hospital.

## 2017-07-13 NOTE — Transfer of Care (Signed)
Immediate Anesthesia Transfer of Care Note  Patient: Michael [name]  Procedure(s) Performed: Procedure(s): LEFT ARM ARTERIOVENOUS (AV) FISTULA CREATION (Left) INSERTION OF DIALYSIS CATHETER RIGHT INTERNAL JUGULAR (Right)  Patient Location: PACU  Anesthesia Type:General  Level of Consciousness: awake and alert   Airway & Oxygen Therapy: Patient Spontanous Breathing and Patient connected to nasal cannula oxygen  Post-op Assessment: Report given to RN and Post -op Vital signs reviewed and stable  Post vital signs: Reviewed and stable  Last Vitals:  Vitals:   07/13/17 0000 07/13/17 0543  BP: 130/74 93/65  Pulse: (!) 47 79  Resp: 15 11  Temp:  36.9 C  SpO2: 100% 100%    Last Pain:  Vitals:   07/13/17 0700  TempSrc:   PainSc: Asleep      Patients Stated Pain Goal: 0 (73/54/30 1484)  Complications: No apparent anesthesia complications

## 2017-07-13 NOTE — Anesthesia Procedure Notes (Signed)
Procedure Name: LMA Insertion Date/Time: 07/13/2017 8:56 AM Performed by: Rejeana Brock L Pre-anesthesia Checklist: Patient identified, Emergency Drugs available, Suction available and Patient being monitored Patient Re-evaluated:Patient Re-evaluated prior to induction Oxygen Delivery Method: Circle System Utilized Preoxygenation: Pre-oxygenation with 100% oxygen Induction Type: IV induction Ventilation: Mask ventilation without difficulty LMA: LMA inserted LMA Size: 4.0 Number of attempts: 1 Airway Equipment and Method: Bite block Placement Confirmation: positive ETCO2 Tube secured with: Tape Dental Injury: Teeth and Oropharynx as per pre-operative assessment

## 2017-07-13 NOTE — Procedures (Signed)
Patient seen on Hemodialysis. QB 300, UF goal 2.5L Treatment adjusted as needed.  Elmarie Shiley MD [facility]. Office # [phone] Pager # [phone] 12:43 PM

## 2017-07-13 NOTE — Progress Notes (Signed)
Patient ID: Michael [name], male   DOB: [birth year], 56 y.o.   MRN: [id number]  [facility] Progress Note   Assessment/ Plan:   1. Acute on chronic diastolic heart failure (EF 50-55%) with long-standing mitral valve disease. Started on renal replacement therapy with limited response to diuretic therapy with now what entails essentially to chronic hemodialysis support. 2. ESRD with evidence of chronic cardiorenal syndrome exacerbated by recent CHF exacerbation, continue maintenance hemodialysis at this time with ultrafiltration. Plans for TDC/AVF today and thereafter consideration for discharge to outpatient dialysis unit. Fortunately, he has some RRF with urine output.  3. Anemia:Continue Aranesp for management of anemia, status post intravenous iron.  4. CKD-MBD:Hyperphosphatemia noted with a normal level of calcium, will start low-dose calcium acetate.  5. Atrial fibrillation:persistent and refractory to DCCV attempt in recent past. Cannot tolerate carvedilol because of hypotension.   Subjective:   Report some severe back pain earlier this morning/last night-currently resolved. Inquiring about disposition.   Objective:   BP 93/65 (BP Location: Right Arm)   Pulse 79   Temp 98.5 F (36.9 C) (Oral)   Resp 11   Ht 5\' 5"  (1.651 m)   Wt 75.3 kg (166 lb 0.1 oz)   SpO2 100%   BMI 27.62 kg/m   Physical Exam: TDS:KAJGOTL to be comfortable resting in bed  XBW:IOMBT irregularly irregular, S1 and S2 normal  Resp:Poor inspiratory effort with decreased breath sounds over bases. Clear anteriorly  DHR:CBUL, obese, nontender  AGT:XMIWO dependent edema, no pretibial edema. Venous stasis changes left leg >right leg   Labs: BMET  Recent Labs Lab 07/07/17 1722 07/08/17 0353 07/08/17 1540 07/09/17 0443 07/10/17 0456 07/11/17 0500 07/13/17 0605  NA 134* 136 131* 137 133* 135 129*  K 4.9 4.4 5.0 3.3* 3.7 4.4 5.0  CL 100* 102 99* 99* 95* 98* 95*  CO2 25 24 22 29 27 28 23    GLUCOSE 178* 85 123* 70 87 73 95  BUN 31* 44* 55* 12 35* 27* 61*  CREATININE 2.18* 3.02* 3.57* 1.44* 2.97* 2.43* 3.44*  CALCIUM 8.8* 9.0 8.7* 8.7* 8.7* 8.7* 9.0  PHOS 4.1 4.7* 6.7* 2.8 4.2 4.1 6.0*   CBC  Recent Labs Lab 07/10/17 0456 07/11/17 0500 07/12/17 0333 07/13/17 0605  WBC 11.7* 11.7* 9.8 9.7  HGB 8.0* 8.0* 8.2* 7.0*  HCT 24.6* 24.7* 24.9* 20.8*  MCV 75.5* 76.5* 77.6* 76.2*  PLT 178 152 135* 138*   Medications:    . amiodarone  400 mg Oral BID  . atorvastatin  20 mg Oral Daily  . darbepoetin (ARANESP) injection - NON-DIALYSIS  200 mcg Subcutaneous Q Sat-1800  . heparin  40 Units/kg Dialysis Once in dialysis  . insulin aspart  0-15 Units Subcutaneous TID WC  . insulin aspart  4 Units Subcutaneous TID WC  . insulin detemir  8 Units Subcutaneous Q2200  . pantoprazole  40 mg Oral Daily  . [START ON 07/15/2017] predniSONE  20 mg Oral Q breakfast  . predniSONE  40 mg Oral Q breakfast  . sodium chloride  2 spray Each Nare BID  . sodium chloride flush  10-40 mL Intracatheter Q12H  . sodium chloride flush  3 mL Intravenous Q12H   Elmarie Shiley, MD 07/13/2017, 7:51 AM

## 2017-07-13 NOTE — Progress Notes (Signed)
  Progress Note    07/13/2017 8:06 AM 4 Days Post-Op  Subjective:  No pain currently  Vitals:   07/13/17 0000 07/13/17 0543  BP: 130/74 93/65  Pulse: (!) 47 79  Resp: 15 11  Temp:  98.5 F (36.9 C)  SpO2: 100% 100%    Physical Exam: aaox3 Right ij catheter in place Palpable left radial pulse  CBC    Component Value Date/Time   WBC 9.7 07/13/2017 0605   RBC 2.73 (L) 07/13/2017 0605   HGB 7.0 (L) 07/13/2017 0605   HCT 20.8 (L) 07/13/2017 0605   PLT 138 (L) 07/13/2017 0605   MCV 76.2 (L) 07/13/2017 0605   MCH 25.6 (L) 07/13/2017 0605   MCHC 33.7 07/13/2017 0605   RDW 20.4 (H) 07/13/2017 0605   LYMPHSABS 1.1 02/21/2017 1846   MONOABS 0.7 02/21/2017 1846   EOSABS 0.3 02/21/2017 1846   BASOSABS 0.1 02/21/2017 1846    BMET    Component Value Date/Time   NA 129 (L) 07/13/2017 0605   NA 140 03/30/2017 1406   K 5.0 07/13/2017 0605   CL 95 (L) 07/13/2017 0605   CO2 23 07/13/2017 0605   GLUCOSE 95 07/13/2017 0605   BUN 61 (H) 07/13/2017 0605   BUN 33 (H) 03/30/2017 1406   CREATININE 3.44 (H) 07/13/2017 0605   CREATININE 1.61 (H) 01/16/2017 1653   CALCIUM 9.0 07/13/2017 0605   GFRNONAA 18 (L) 07/13/2017 0605   GFRNONAA 47 (L) 01/16/2017 1653   GFRAA 21 (L) 07/13/2017 0605   GFRAA 54 (L) 01/16/2017 1653    INR    Component Value Date/Time   INR 1.41 07/13/2017 0605     Intake/Output Summary (Last 24 hours) at 07/13/17 0806 Last data filed at 07/13/17 0250  Gross per 24 hour  Intake           807.78 ml  Output              450 ml  Net           357.78 ml     Assessment:  56 y.o. male is here with ckd in need of hd currently via temporary catheter. CT scan checked overnight for suspected rp bleed but negative. Pain now under control  Plan: Tunneled catheter today. If tolerates ok will proceed with left arm avf   Dylin Ihnen C. Donzetta Matters, MD [facility] Office: [phone] Pager: [phone]  07/13/2017 8:06 AM

## 2017-07-13 NOTE — Progress Notes (Signed)
PT Cancellation Note  Patient Details Name: Michael [name] MRN: [id number] DOB: 14-Aug-1961   Cancelled Treatment:    Reason Eval/Treat Not Completed: Other (comment) (pt currently in OR and unavailable)   Crestina Strike B Weronika Birch 07/13/2017, 9:43 AM  Elwyn Reach, [place]

## 2017-07-13 NOTE — Op Note (Signed)
+   Patient name: Michael [name] MRN: [id number] DOB: Apr 06, 1961 Sex: male  07/13/2017 Pre-operative Diagnosis: esrd Post-operative diagnosis:  Same Surgeon:  Erlene Quan C. Donzetta Matters, MD Assistant: Silva Bandy, PA Procedure Performed: 1.  Right IJ tunneled dialysis catheter with US guidance 2.  Left arm 1st stage basilic vein av fistula  Indications:  56yo male with ckd now in need of hd via temporary catheter. He is now indicated for tunneled catheter and fistula creation.   Findings: The right IJ was compressible and easily cannulated. At completion of the catheter placement it terminated at the SVC flushed easily. On ultrasound the left cephalic vein appeared of adequate size for fistula creation however grossly it was very thin-walled and bled easily with any manipulation. Clips were placed on cephalic vein and basilic vein was then evaluated which was noted to be normal caliber patent throughout on ultrasound and easily dilated to 4 mm. The brachial artery had an external diameter of 5 mm. After fistula creation was a palpable thrill in the runoff vein as well as palpable radial pulse on the left.   Procedure:  The patient was identified in the holding area and taken to the operating room where he was placed supine on the operative table and LMA anesthesia was induced. He is in sterilely prepped and draped in the right neck in the usual fashion for tunneled catheter placement and timeout called. Antibiotics were administered. Following this the right IJ was identified with ultrasound and cannulated with 18-gauge needle. A wire passed under fluoroscopic guidance into the IVC. A counter incision was made and a 19 cm catheter was tunneled. I then serially dilated the wire tract and placed the introducer sheath under fluoroscopic guidance. The catheter was then introduced. It was then assembled after being trimmed to size and flushed with heparinized saline. Fluoroscopically it terminated at the [facility] with  smooth curve over the clavicle. He was then affixed the skin with 3-0 nylon suture and the neck incision closed with 4 Monocryl and Dermabond posted both spots and dry dressing placed. Heparin was instilled into the catheter and manufacturer recommendation.  Drapes were then removed and the left upper extremity was prepped and draped in usual fashion. Timeout was then called for left arm AV fistula versus graft. Ultrasound was used to first identify the cephalic vein which appeared adequate diameter for fistula creation was also compressible. Transverse incision was made overlying that in the brachial artery pulsation. As we dissected down cephalic vein was noted to be very thin-walled and friable. Did bleed with very minimal manipulation was therefore clipped. We then used ultrasound to identify the basilic vein which was noted to be normally thick-walled and also compressible. This was then dissected out the forearm for a few centimeters more for orientation divided distally. The fascia was then divided brachial artery was identified and vessel loop placed around it. We then flushed the vein with heparinized saline and dilated to 4 mm and spatulated the ends. The artery was then clamped distally and proximally opened longitudinally and flushed with heparinized saline in both directions. Vein was then sewn end-to-side with 6-0 Prolene suture. Prior to completing the anastomosis we did allow flushing from all directions. Upon completion of the palpable thrill in the runoff vein as well as a palpable radial artery pulse and these were confirmed with Doppler. Satisfied we irrigated the wound obtained hemostasis and closed in 2 layers with Vicryl for Monocryl at the level of the skin. Dermabond was placed above that.  Patient tolerated procedure well without immediate competition. All counts were correct at completion.     Brandon C. Donzetta Matters, MD [facility] Office:  [phone] Pager: [phone]

## 2017-07-13 NOTE — Progress Notes (Signed)
2060: Patient woke up complaining of severe lower back pain. Oxycodone given and repositioned, but no improvement. Patient moaning in pain and appears very uncomfortable. Called Dr. Haroldine Laws (provider on call). He ordered a Stat CBC and if Hbg is 0.5 g/dl less than yesterday to order a STAT CT of abdomen and pelvis without contrast, Fentanyl 27mcg IV X 1, and to call cardiologist through the app.

## 2017-07-13 NOTE — Progress Notes (Signed)
Nutrition Brief Note  RD pulled to pt chart per request of RN. She reports pt complains of extreme hunger between meals and is requesting orders for snacks and Nepro. Per discussion with RN, wt loss is related to fluid losses- pt lost 15# this admission. RN has already requested HS snack from MD- will order Nepro daily and high protein snacks between meals to assist with satisfying hunger.  Wt Readings from Last 15 Encounters:  07/13/17 166 lb 0.1 oz (75.3 kg)  06/25/17 201 lb 12.8 oz (91.5 kg)  06/02/17 185 lb (83.9 kg)  03/31/17 169 lb (76.7 kg)  03/16/17 165 lb 12.8 oz (75.2 kg)  03/03/17 165 lb (74.8 kg)  01/16/17 169 lb 6.4 oz (76.8 kg)  12/12/16 163 lb 3.2 oz (74 kg)  11/03/16 174 lb 6.4 oz (79.1 kg)  10/16/16 187 lb 6.4 oz (85 kg)  10/06/16 172 lb 6.4 oz (78.2 kg)  09/16/16 168 lb (76.2 kg)  08/08/16 165 lb 9.6 oz (75.1 kg)  07/01/16 173 lb 9.6 oz (78.7 kg)  06/17/16 176 lb 12.8 oz (80.2 kg)   Body mass index is 27.62 kg/m. Patient meets criteria for overweight based on current BMI.   Current diet order is renal/carb modified, patient is consuming approximately 100% of meals at this time. Labs and medications reviewed.   No further nutrition interventions warranted at this time. If nutrition issues arise, please consult RD.   Danya Spearman A. Jimmye Norman, RD, LDN, CDE Pager: [phone] After hours Pager: [phone]

## 2017-07-13 NOTE — Consult Note (Signed)
[facility] Consult: 12:38 PM 07/13/2017  LOS: 16 days    Referring Provider: Dr Aundra Dubin  Primary Care Physician:  [name], MD Primary Gastroenterologist:  Dr. Silverio Decamp     Reason for Consultation:  Acute on chronic anemia.  Recent hematochezia.     HPI: Michael [name] is a 56 y.o. male.  Speaks Laotian and some [place].Marland Kitchen  PMH CKD stage 4.  Systolic CHF.  S/p bioprosthetic MVR with Maze procedure, tricuspid valve repaier 2012; despite this has severe MV regurge.   Parox A fib, on chronic Coumadin.  Pulmonary htn.  DM 2.  GERD.  Obesity.  OSA.  Low health literacy.    03/31/17 GI OV with Fabio Asa PA-C for overdue/never had screening colonoscopy.   Dr Silverio Decamp wanted to wait for pending echo and cardiac clearance before scheduling colonoscopy off Coumadin.   Admission with anasarca in mid 05/2017.  CT 05/30/17: Anasarca, pleural effusions, ascites, CM. Renal stones Hepatitis A/B/C negative. Cirrhosis, small ascites (too small to tap) diagnosed on ultrasound of 05/31/17.  > 2 weeks into current admission with acute on chronic renal failure, volume overload. Hgb baseline in Mid July was 9.5 - 10.5. Low MCV mid 70s.    At admission Hgb 8.0. Nadir 7.3.   Was 8 >> 8.2 >> 7.0 over last 3 days.   Received single PRBC today. Platelets in 130s.  Coags, PT/INR, as high as 30/2.8. Today 17.4/1.4  Current EF 50 to 55%.  Right heart cath: Elevated right and left heart filling pressures, more prominent RV failure.  Pulmonary venous hypertension.  Cardiac output vigorous on milrinone Repeat CT 8/27: left kidney sotnes.  Nodular liver.  CM.  Trace right pleural effusion.  Large stool burden.    Initially underwent CVVHD but more recently transitioned to hemodialysis via right IJ catheter in the neck.  S/p left UE AV  fistula today.     A few days prior to his admission the patient reports several, what sounds like large volume, episodes of painless hematochezia. These lasted a day or 2 but resolved. He has had no hematochezia in the hospital but stool FOBT + pm 8/16. This episode of hematochezia was not associated with constipation, though he is occasionally bothered with constipation.  He denies nausea vomiting. Appetite is good. Breathing is a lot better.    Past Medical History:  Diagnosis Date  . Anemia of chronic disease   . Asthma    said he does not know  . Chronic kidney disease (CKD), stage IV (severe) ([place])   . Chronic systolic CHF (congestive heart failure) (HCC)    a. prior EF normal; b. Echo 10/16: Mild LVH, EF 30-35%, mitral valve bioprosthesis present without evidence of stenosis or regurgitation, severe LAE, mild RVE with mild to moderately reduced RVSF, moderate RAE  . Coronary artery disease   . Diabetes mellitus type 2 in obese ([place])   . Diabetes mellitus without complication ([place])   . Dyspnea   . GERD (gastroesophageal reflux disease)   . H/O tricuspid valve repair 2012  . History  of echocardiogram    Echo at [facility] in [facility] 4/12: mod LVH, EF 60-65%, mod LAE, tissue MVR ok, mod TR, mod pulmo HTN with RVSP 48 mmHg  . Hypertension   . Obstructive sleep apnea   . Paroxysmal atrial fibrillation (HCC)    s/p Maze procedure at time of MVR in 2011  . Pulmonary HTN ([place])   . 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 CARDIAC SURGERY    . CARDIOVERSION N/A 07/09/2017   Procedure: CARDIOVERSION;  Surgeon: Larey Dresser, MD;  Location: [facility] ENDOSCOPY;  Service: Cardiovascular;  Laterality: N/A;  . RIGHT HEART CATH N/A 06/29/2017   Procedure: RIGHT HEART CATH;  Surgeon: Larey Dresser, MD;  Location: [facility] CV LAB;  Service: Cardiovascular;  Laterality: N/A;  . TEE WITHOUT CARDIOVERSION N/A 07/09/2017   Procedure:  TRANSESOPHAGEAL ECHOCARDIOGRAM (TEE);  Surgeon: Larey Dresser, MD;  Location: [facility] ENDOSCOPY;  Service: Cardiovascular;  Laterality: N/A;    Prior to Admission medications   Medication Sig Start Date End Date Taking? Authorizing Provider  acetaminophen (TYLENOL) 650 MG CR tablet Take 650 mg by mouth every 6 (six) hours as needed for pain.   Yes [provider]  carvedilol (COREG) 25 MG tablet Take 1 tablet (25 mg total) by mouth 2 (two) times daily with a meal. 06/02/17  Yes Mikhail, [place], DO  colchicine 0.6 MG tablet Take 1.2 mg by mouth daily as needed for pain. 06/02/17  Yes [provider]  diclofenac sodium (VOLTAREN) 1 % GEL Apply 4 g topically 4 (four) times daily.   Yes [provider]  folic acid (FOLVITE) 1 MG tablet TAKE 1 TABLET BY MOUTH DAILY. 04/20/17  Yes [name], MD  furosemide (LASIX) 80 MG tablet Take 1 tablet (80 mg total) by mouth 2 (two) times daily. 12/12/16  Yes [name], MD  Insulin Detemir (LEVEMIR FLEXTOUCH) 100 UNIT/ML Pen Inject 10 Units into the skin daily at 10 pm. 10/16/16  Yes [name], MD  metoprolol tartrate (LOPRESSOR) 100 MG tablet Take 100 mg by mouth 2 (two) times daily. 05/27/17  Yes [provider]  omeprazole (PRILOSEC) 20 MG capsule Take 1 capsule (20 mg total) by mouth daily. 03/03/17  Yes [name], MD  OXYGEN Inhale 2 L into the lungs daily.   Yes [provider]  traMADol (ULTRAM) 50 MG tablet Take 1 tablet (50 mg total) by mouth every 12 (twelve) hours as needed. Patient taking differently: Take 50 mg by mouth every 12 (twelve) hours as needed for moderate pain.  06/12/17  Yes [name], MD  warfarin (COUMADIN) 2.5 MG tablet Take coumadin as directed by coumadin clinic Patient taking differently: Take 2.5 mg by mouth daily at 6 PM. Or as directed by coumadin clinic 06/10/17  Yes Fay Records, MD    Scheduled Meds: . amiodarone  400 mg Oral BID  . atorvastatin  20 mg Oral Daily  .  darbepoetin (ARANESP) injection - NON-DIALYSIS  200 mcg Subcutaneous Q Sat-1800  . docusate sodium  100 mg Oral BID  . feeding supplement (NEPRO CARB STEADY)  237 mL Oral q morning - 10a  . heparin  40 Units/kg Dialysis Once in dialysis  . insulin aspart  0-15 Units Subcutaneous TID WC  . insulin aspart  4 Units Subcutaneous TID WC  . insulin detemir  8 Units Subcutaneous Q2200  . pantoprazole  40 mg Oral Daily  .  polyethylene glycol  17 g Oral Daily  . [START ON 07/15/2017] predniSONE  20 mg Oral Q breakfast  . predniSONE  40 mg Oral Q breakfast  . sodium chloride  2 spray Each Nare BID  . sodium chloride flush  10-40 mL Intracatheter Q12H  . sodium chloride flush  3 mL Intravenous Q12H   Infusions: . sodium chloride 0  (07/01/17 1500)  . sodium chloride Stopped (07/06/17 0814)  . sodium chloride    . sodium chloride    . sodium chloride    . heparin 1,300 Units/hr (07/12/17 1813)   PRN Meds: sodium chloride, sodium chloride, sodium chloride, acetaminophen, alteplase, diphenhydrAMINE, heparin, heparin, HYDROmorphone (DILAUDID) injection, hydrOXYzine, lidocaine (PF), lidocaine-prilocaine, oxyCODONE, pentafluoroprop-tetrafluoroeth, sodium chloride flush, sodium chloride flush   Allergies as of 06/25/2017  . (No Known Allergies)    Family History  Problem Relation Age of Onset  . Heart attack Neg Hx     Social History   Social History  . Marital status: Single    Spouse name: N/A  . Number of children: N/A  . Years of education: N/A   Occupational History  .      on disability   Social History Main Topics  . Smoking status: Current Some Day Smoker    Packs/day: 1.00    Types: Cigarettes  . Smokeless tobacco: Former Systems developer     Comment: said he is not a regular smoker  . Alcohol use No  . Drug use: No  . Sexual activity: Not Currently   Other Topics Concern  . Not on file   Social History Narrative  . No narrative on file    REVIEW OF  SYSTEMS: Constitutional:  Feeling stronger than when he was admitted ENT:  No nose bleeds Pulm:  Breathing is a lot better than at admission CV:  No palpitations, no LE edema.  GU:  No hematuria, no frequency.  GI:  Per HPI Heme:  Patient bruises easily but has not had a excessive bleeding or bruising.   Transfusions:  Does not recall previous transfusions. Neuro:  No headaches, no peripheral tingling or numbness Derm:  No itching, no rash or sores.  Endocrine:  No sweats or chills.  No polyuria or dysuria Immunization:  Did not ask him about recent or previous immunizations. Travel:  None beyond local counties in last few months.    PHYSICAL EXAM: Vital signs in last 24 hours: Vitals:   07/13/17 1147 07/13/17 1200  BP: 126/74 123/80  Pulse:  96  Resp: 17 17  Temp: 97.7 F (36.5 C)   SpO2: 100% 100%   Wt Readings from Last 3 Encounters:  07/13/17 75.3 kg (166 lb 0.1 oz)  06/25/17 91.5 kg (201 lb 12.8 oz)  06/02/17 83.9 kg (185 lb)    General: edematous appearing, somewhat ill appearing Head:  No asymmetry. Face looks swollen.  Eyes:  No scleral icterus. No conjunctival pallor. Ears:  Not hard of hearing  Nose:  No discharge or congestion Mouth:  Oral mucosa pink, moist, clear. Tongue midline. Neck:  No JVD, no masses, no thyromegaly. Lungs:  No increased work of breathing or cough. Lungs clear bilaterally. Heart: heart rate is irregular. 2/6 systolic murmur. Abdomen:  Soft. Nontender. Active bowel sounds. No HSM. Fullness/firmness without discreet mass in left lower quadrant. GU:  No scrotal/penile edema.  Rectal: deferred rectal exam.   Musc/Skeltl: no joint erythema, swelling or gross deformity. Extremities:  No CCE.  Neurologic:  Alert. Oriented times 3.  Skin:  A few telangiectasias on the upper chest. Nodes:  No adenopathy   Psych:  Pleasant, calm  Intake/Output from previous day: 08/26 0701 - 08/27 0700 In: 1272 [P.O.:960; I.V.:312] Out: 850  [Urine:850] Intake/Output this shift: Total I/O In: 940 [I.V.:525; Blood:315; Other:100] Out: 15 [Blood:15]  LAB RESULTS:  Recent Labs  07/11/17 0500 07/12/17 0333 07/13/17 0605  WBC 11.7* 9.8 9.7  HGB 8.0* 8.2* 7.0*  HCT 24.7* 24.9* 20.8*  PLT 152 135* 138*   BMET Lab Results  Component Value Date   NA 129 (L) 07/13/2017   NA 135 07/11/2017   NA 133 (L) 07/10/2017   K 5.0 07/13/2017   K 4.4 07/11/2017   K 3.7 07/10/2017   CL 95 (L) 07/13/2017   CL 98 (L) 07/11/2017   CL 95 (L) 07/10/2017   CO2 23 07/13/2017   CO2 28 07/11/2017   CO2 27 07/10/2017   GLUCOSE 95 07/13/2017   GLUCOSE 73 07/11/2017   GLUCOSE 87 07/10/2017   BUN 61 (H) 07/13/2017   BUN 27 (H) 07/11/2017   BUN 35 (H) 07/10/2017   CREATININE 3.44 (H) 07/13/2017   CREATININE 2.43 (H) 07/11/2017   CREATININE 2.97 (H) 07/10/2017   CALCIUM 9.0 07/13/2017   CALCIUM 8.7 (L) 07/11/2017   CALCIUM 8.7 (L) 07/10/2017   LFT  Recent Labs  07/11/17 0500 07/13/17 0605  ALBUMIN 3.0* 3.0*   PT/INR Lab Results  Component Value Date   INR 1.41 07/13/2017   INR 1.45 07/12/2017   INR 1.70 07/11/2017   Hepatitis Panel No results for input(s): HEPBSAG, HCVAB, HEPAIGM, HEPBIGM in the last 72 hours. C-Diff No components found for: CDIFF Lipase     Component Value Date/Time   LIPASE 87 (H) 05/30/2017 0545    Drugs of Abuse  No results found for: LABOPIA, COCAINSCRNUR, LABBENZ, AMPHETMU, THCU, LABBARB   RADIOLOGY STUDIES: Ct Abdomen Pelvis Wo Contrast  Result Date: 07/13/2017 CLINICAL DATA:  Dropping hemoglobin, severe low back EXAM: CT ABDOMEN AND PELVIS WITHOUT CONTRAST TECHNIQUE: Multidetector CT imaging of the abdomen and pelvis was performed following the standard protocol without IV contrast. COMPARISON:  05/30/2017 FINDINGS: Lower chest: Cardiomegaly. Trace right pleural effusion. No confluent opacities in the lung bases. Hepatobiliary: No visible focal hepatic abnormality. Gallbladder unremarkable.  Slightly nodular contours of the liver surface suggests possibility of cirrhosis. Pancreas: No focal abnormality or ductal dilatation. Spleen: No focal abnormality.  Normal size. Adrenals/Urinary Tract: Large left renal calculi noted. No hydronephrosis. No stones or hydronephrosis on the right. Adrenal glands and urinary bladder grossly unremarkable. Stomach/Bowel: Large stool burden throughout the colon. Stomach and small bowel decompressed, unremarkable. Moderate amount of food material within the stomach. Vascular/Lymphatic: Aortic and iliac calcifications. No evidence of aneurysm or adenopathy. Reproductive: No visible focal abnormality. Other: Trace free fluid pelvis. No free air. No evidence of retroperitoneal bleed. Musculoskeletal: No acute bony abnormality. IMPRESSION: No explanation for the patient's decreasing hemoglobin. Left nephrolithiasis. Slightly nodular contours of the liver suggests possibility of cirrhosis. Trace right effusion. Cardiomegaly. Large stool burden. Electronically Signed   By: Rolm Baptise M.D.   On: 07/13/2017 07:12   Dg Chest Port 1 View  Result Date: 07/13/2017 CLINICAL DATA:  Dialysis catheter placement EXAM: PORTABLE CHEST 1 VIEW COMPARISON:  06/30/2017 FINDINGS: Right internal jugular dialysis catheter remains in place. Interval placement of tunneled dialysis catheter on the right with the tip in the SVC. No pneumothorax. Prior CABG. Heart is enlarged. Improving aeration in the right lung base. Mild  residual bibasilar atelectasis. No edema or effusions. IMPRESSION: Interval placement of right internal jugular tunnel dialysis catheter with the tip in the SVC. No pneumothorax. Cardiomegaly, bibasilar atelectasis. Electronically Signed   By: Rolm Baptise M.D.   On: 07/13/2017 11:16     IMPRESSION:   *  Microcytic anemia.  Anemia multifactorial:  CKD, recent cardiac cath, blood draws.   S/p PRBC x 1 today.    *  Cirrhosis of liver diagnosed 6 weeks ago at ultrasound.   Hep ABC negative.   *  AKI/CKD 4.   At sig risk of requiring dialysis if edema can't be controlled w/ medication.  Had AV fistula placed today.     *  Right heart failure.  Volume overload.  Responding to diuresis.    *  Previous MVR and tricuspid valve repair.    *  Chronic Coumadin for PAF.    *  IDDM, type 2.    *  OSA  *  Thrombocytopenia.     PLAN:     *  Colonoscopy and EGD in next few days.    *  Check ANA, AMA, smooth muscle AB.     Azucena Freed  07/13/2017, 12:38 PM Pager: [phone]

## 2017-07-13 NOTE — Progress Notes (Signed)
Patient ID: Michael [name], male   DOB: 1961-06-17, 56 y.o.   MRN: [id number]     Advanced Heart Failure Rounding Note  Primary Cardiologist: Dr. Harrington Challenger HF Cardiology: Aundra Dubin  Subjective:    Started CVVHD 8/15 and stopped milrinone. CVVH stopped 8/21. Had HD on 8/22 and 8/24.  On 8/23, he had TEE-guided DCCV to NSR.  Reverted back to AF 8/24  Severe lower mid back pain last night, hemoglobin down to 7.  He had CT abdomen/pelvis, no retroperitoneal hematoma or other cause of fall in hemoglobin. INR 1.4 today, remains on heparin gtt.   Studies: Echo (7/18): EF 50-55%, mild LVH, mild AI, bioprosthetic mitral valve appeared to function normally, RV moderately dilated with moderately decreased systolic function, D-shaped interventricular septum suggestive of RV pressure/volume overload, severe TR, PASP 54 mmHg  [facility] Procedural Findings: Hemodynamics (mmHg) RA mean 27 RV 63/8, mean 20 PA 58/28, mean 40 PCWP mean 28 Oxygen saturations: PA 69% AO 96% Cardiac Output (Fick) 9.82  Cardiac Index (Fick) 4.96 PVR 1.2 WU  TEE (8/23): Normal LV size and systolic function, EF 92-42%.  Normal wall thickness and motion.  D-shaped interventricular septum suggestive of RV pressure/volume overload.  The RV was severely dilated with moderately decreased systolic function.  Severe LAE, no thrombus seen.  The LA appendage appears to have been ligated.  Moderate right atrial enlargement.  No PFO/ASD by color doppler.  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  Peak RV-RA gradient 25 mmHg.  Status post mitral valve replacement with bioprosthetic valve.  Trivial MR.  Mean gradient 6 mmHg across valve, does not appear to have significant stenosis.  Trileaflet aortic valve with moderate aortic regurgitation, no significant stenosis.  Normal caliber aorta with grade III plaque in descending thoracic aorta.   Objective:   Weight Range: 166 lb 0.1 oz (75.3 kg) Body mass index is 27.62 kg/m.    Vital Signs:   Temp:  [98 F (36.7 C)-98.5 F (36.9 C)] 98.5 F (36.9 C) (08/27 0543) Pulse Rate:  [35-97] 79 (08/27 0543) Resp:  [11-19] 11 (08/27 0543) BP: (93-130)/(62-103) 93/65 (08/27 0543) SpO2:  [100 %] 100 % (08/27 0543) Weight:  [166 lb 0.1 oz (75.3 kg)] 166 lb 0.1 oz (75.3 kg) (08/27 0543) Last BM Date: 07/10/17  Weight change: Filed Weights   07/10/17 1126 07/12/17 0328 07/13/17 0543  Weight: 158 lb 11.7 oz (72 kg) 161 lb 4.8 oz (73.2 kg) 166 lb 0.1 oz (75.3 kg)    Intake/Output:   Intake/Output Summary (Last 24 hours) at 07/13/17 0758 Last data filed at 07/13/17 0250  Gross per 24 hour  Intake             1272 ml  Output              850 ml  Net              422 ml      Physical Exam   General: NAD Neck: JVP 8-9 cm, no thyromegaly or thyroid nodule.  Lungs: Clear to auscultation bilaterally with normal respiratory effort. CV: Nondisplaced PMI.  Heart irregular S1/S2, no S3/S4, 2/6 HSM LLSB.  No peripheral edema.   Abdomen: Soft, nontender, no hepatosplenomegaly, no distention.  Skin: Intact without lesions or rashes.  Neurologic: Alert and oriented x 3.  Psych: Normal affect. Extremities: No clubbing or cyanosis.  HEENT: Normal.    Telemetry   Personally reviewed. AF 80s  Labs    CBC  Recent  Labs  07/12/17 0333 07/13/17 0605  WBC 9.8 9.7  HGB 8.2* 7.0*  HCT 24.9* 20.8*  MCV 77.6* 76.2*  PLT 135* 073*   Basic Metabolic Panel  Recent Labs  07/11/17 0500 07/13/17 0605  NA 135 129*  K 4.4 5.0  CL 98* 95*  CO2 28 23  GLUCOSE 73 95  BUN 27* 61*  CREATININE 2.43* 3.44*  CALCIUM 8.7* 9.0  MG 2.1  --   PHOS 4.1 6.0*   Liver Function Tests  Recent Labs  07/11/17 0500 07/13/17 0605  ALBUMIN 3.0* 3.0*   No results for input(s): LIPASE, AMYLASE in the last 72 hours. Cardiac Enzymes No results for input(s): CKTOTAL, CKMB, CKMBINDEX, TROPONINI in the last 72 hours.  BNP: BNP (last 3 results)  Recent Labs  10/05/16 0147  05/30/17 1208 06/25/17 1253  BNP 237.4* 1,404.9* 875.7*    ProBNP (last 3 results)  Recent Labs  03/16/17 1401  PROBNP 6,530*        Imaging   Transthoracic Echocardiography 06/01/17  Study Conclusions  - Left ventricle: The cavity size was normal. There was mild   concentric hypertrophy. Systolic function was normal. The   estimated ejection fraction was in the range of 50% to 55%. Wall   motion was normal; there were no regional wall motion   abnormalities. - Ventricular septum: The contour showed diastolic flattening. - Aortic valve: Transvalvular velocity was minimally increased.   There was no stenosis. There was mild regurgitation. Peak   velocity (S): 201 cm/s. - Mitral valve: A bioprosthesis was present and functioning   normally. Mean gradient (D): 6 mm Hg. Valve area by pressure   half-time: 1.93 cm^2. - Left atrium: The atrium was severely dilated. - Right ventricle: The cavity size was moderately dilated. Wall   thickness was normal. Systolic function was moderately reduced. - Right atrium: The atrium was moderately dilated. - Tricuspid valve: There was severe regurgitation. - Pulmonary arteries: Systolic pressure was moderately increased.   PA peak pressure: 54 mm Hg (S).   RIGHT HEART CATH 06/29/17 RA mean 27 RV 63/8, mean 20 PA 58/28, mean 40 PCWP mean 28  Oxygen saturations: PA 69% AO 96%  Cardiac Output (Fick) 9.82  Cardiac Index (Fick) 4.96 PVR 1.2 WU     Medications:     Scheduled Medications: . amiodarone  400 mg Oral BID  . atorvastatin  20 mg Oral Daily  . darbepoetin (ARANESP) injection - NON-DIALYSIS  200 mcg Subcutaneous Q Sat-1800  . heparin  40 Units/kg Dialysis Once in dialysis  . insulin aspart  0-15 Units Subcutaneous TID WC  . insulin aspart  4 Units Subcutaneous TID WC  . insulin detemir  8 Units Subcutaneous Q2200  . pantoprazole  40 mg Oral Daily  . [START ON 07/15/2017] predniSONE  20 mg Oral Q breakfast  .  predniSONE  40 mg Oral Q breakfast  . sodium chloride  2 spray Each Nare BID  . sodium chloride flush  10-40 mL Intracatheter Q12H  . sodium chloride flush  3 mL Intravenous Q12H    Infusions: . sodium chloride Stopped (07/01/17 1500)  . sodium chloride Stopped (07/06/17 0814)  . sodium chloride    . sodium chloride    . sodium chloride    . cefUROXime (ZINACEF)  IV    . 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 Patient underwent mitral valve replacement and tricuspid valve repair in [place] in 2012.    Assessment/Plan   1. Acute on chronic diastolic CHF/RV failure: EF 50-55% on echo in 7/18 with prominent RV failure (enlarged/hypokinetic RV with D-shaped interventricular septum), severe TR.  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  Milrinone was started at admission for RV support, stopped now that he is getting RRT.  CVVH stopped 8/21 and had HD session on 8/22 and 8/24.   - Volume well controlled on HD. Will continue  - Coreg stopped with low BP in HD.   2. AKI on CKD stage IV: Now getting RRT and off milrinone.  He is going to require long-term HD, significant RV failure will complicate this but he has done well with it so far.   He lives alone and doesn't have much help with his healthcare so chance at heart-kidney transplant would be very low.  - [facility] has seen. For AVF and tunneled catheter today. Off coumadin. Remains on heparin.  3. Atrial fibrillation: Persistent.  Has been in atrial fibrillation for a number of months it appears.   I was not sure how long he could stay in NSR with DCCV given severe TR and RV failure (even with amiodarone), but given the severity of his HF, I did a TEE-guided DCCV on 8/23.  Back in afib on 8/24 and has stayed in afib.  - Would not repeat DCCV.  Continue amiodarone today peri-operatively, convert over to Toprol XL tomorrow.   - On heparin gtt and off warfarin for access placement tomorrow. Will need evaluation of anemia as well.  4. Anemia: He has had BRBPR but not in the last few days.  Hemoglobin down to 7 today.   - Will give 1 unit PRBCs.   - I will have GI see him today for evaluation of GI bleeding/anemia.  - Started on ESA per renal 5. Bioprosthetic mitral valve: Stable appearance on 7/18 echo.  No change.  6. Probable sleep apnea:  - Will need sleep study outpatient. No change.    7. Acute gout in R foot: Prednisone restarted 8/25 x 3 days  Length of Stay: [place], MD  07/13/2017, 7:58 AM  Advanced Heart Failure Team Pager [phone] (M-F; [place])  Please contact [facility] for night-coverage after hours (4p -7a ) and weekends on [email or web address]

## 2017-07-13 NOTE — Progress Notes (Signed)
Patient returned to the unit from PACU following procedure. I was able to perform assessment and then hemodialysis called stating they were ready for patient. I accompanied transport with patient to HD since there was still about 50cc left of blood transfusing.

## 2017-07-14 ENCOUNTER — Encounter (HOSPITAL_COMMUNITY): Payer: Self-pay | Admitting: Vascular Surgery

## 2017-07-14 LAB — GLUCOSE, CAPILLARY
GLUCOSE-CAPILLARY: 63 mg/dL — AB (ref 65–99)
GLUCOSE-CAPILLARY: 86 mg/dL (ref 65–99)
GLUCOSE-CAPILLARY: 158 mg/dL — AB (ref 65–99)
Glucose-Capillary: 190 mg/dL — ABNORMAL HIGH (ref 65–99)
Glucose-Capillary: 222 mg/dL — ABNORMAL HIGH (ref 65–99)

## 2017-07-14 LAB — CBC
HEMATOCRIT: 24.8 % — AB (ref 39.0–52.0)
HEMOGLOBIN: 8 g/dL — AB (ref 13.0–17.0)
MCH: 25.7 pg — AB (ref 26.0–34.0)
MCHC: 32.3 g/dL (ref 30.0–36.0)
MCV: 79.7 fL (ref 78.0–100.0)
Platelets: 129 10*3/uL — ABNORMAL LOW (ref 150–400)
RBC: 3.11 MIL/uL — AB (ref 4.22–5.81)
RDW: 20.4 % — ABNORMAL HIGH (ref 11.5–15.5)
WBC: 11.6 10*3/uL — ABNORMAL HIGH (ref 4.0–10.5)

## 2017-07-14 LAB — PROTIME-INR
INR: 1.21
PROTHROMBIN TIME: 15.3 s — AB (ref 11.4–15.2)

## 2017-07-14 LAB — IRON AND TIBC
Iron: 55 ug/dL (ref 45–182)
Saturation Ratios: 14 % — ABNORMAL LOW (ref 17.9–39.5)
TIBC: 392 ug/dL (ref 250–450)
UIBC: 337 ug/dL

## 2017-07-14 LAB — FERRITIN: Ferritin: 265 ng/mL (ref 24–336)

## 2017-07-14 LAB — HEPARIN LEVEL (UNFRACTIONATED): Heparin Unfractionated: 0.21 IU/mL — ABNORMAL LOW (ref 0.30–0.70)

## 2017-07-14 MED ORDER — HEPARIN SODIUM (PORCINE) 1000 UNIT/ML DIALYSIS
40.0000 [IU]/kg | INTRAMUSCULAR | Status: DC | PRN
  Filled 2017-07-14: qty 4

## 2017-07-14 MED ORDER — HEPARIN (PORCINE) IN NACL 100-0.45 UNIT/ML-% IJ SOLN
1400.0000 [IU]/h | INTRAMUSCULAR | Status: AC
  Administered 2017-07-14: 1300 [IU]/h via INTRAVENOUS
  Administered 2017-07-16: 1400 [IU]/h via INTRAVENOUS
  Filled 2017-07-14 (×3): qty 250

## 2017-07-14 MED ORDER — POLYETHYLENE GLYCOL 3350 17 G PO PACK
17.0000 g | PACK | Freq: Once | ORAL | Status: AC
  Administered 2017-07-14: 17 g via ORAL
  Filled 2017-07-14: qty 1

## 2017-07-14 MED ORDER — CALCIUM ACETATE (PHOS BINDER) 667 MG PO CAPS
667.0000 mg | ORAL_CAPSULE | Freq: Three times a day (TID) | ORAL | Status: DC
  Administered 2017-07-14 (×3): 667 mg via ORAL
  Filled 2017-07-14 (×3): qty 1

## 2017-07-14 MED ORDER — SODIUM CHLORIDE 0.9 % IV SOLN
510.0000 mg | Freq: Once | INTRAVENOUS | Status: AC
  Administered 2017-07-14: 510 mg via INTRAVENOUS
  Filled 2017-07-14: qty 17

## 2017-07-14 MED ORDER — METOPROLOL SUCCINATE ER 25 MG PO TB24
12.5000 mg | ORAL_TABLET | Freq: Every day | ORAL | Status: DC
  Administered 2017-07-14: 12.5 mg via ORAL
  Filled 2017-07-14 (×3): qty 1

## 2017-07-14 NOTE — Progress Notes (Signed)
Daily Rounding Note  07/14/2017, 11:42 AM  LOS: 17 days   SUBJECTIVE:   Chief complaint: Complaining of neck pain. Nurse is aware. It is felt to be coming as a result of the placement of the tunneled IJ catheter    He is constipated, hasn't had a bowel movement.. Has not passed any blood. Hasn't vomited. Currently he's not complaining of abdominal pain but apparently earlier today he didn't want to eat his breakfast because he had some pain in his belly.   OBJECTIVE:         Vital signs in last 24 hours:    Temp:  [97.6 F (36.4 C)-99.2 F (37.3 C)] 98.7 F (37.1 C) (08/28 0837) Pulse Rate:  [57-142] 142 (08/28 0837) Resp:  [8-25] 9 (08/28 0837) BP: (73-126)/(45-80) 87/73 (08/28 0837) SpO2:  [83 %-100 %] 94 % (08/28 0837) Weight:  [78.6 kg (173 lb 4.8 oz)] 78.6 kg (173 lb 4.8 oz) (08/27 1245) Last BM Date: 07/10/17 Filed Weights   07/12/17 0328 07/13/17 0543 07/13/17 1245  Weight: 73.2 kg (161 lb 4.8 oz) 75.3 kg (166 lb 0.1 oz) 78.6 kg (173 lb 4.8 oz)   General: Looks unwell. He is uncomfortable.   Heart: RRR. Chest: Crackles in the bases. No respiratory distress or cough. Abdomen: Soft. Not tender. Bowel sounds present but hypoactive. No tinkling or tympanitic bowel sounds.  Extremities: No lower extremity edema. Incision from fistula surgery on the left arm is clean, dry and intact. Neuro/Psych:  Oriented to place, year, self.  Intake/Output from previous day: 08/27 0701 - 08/28 0700 In: 1833 [P.O.:880; I.V.:538; Blood:315] Out: 0350 [Urine:300; Blood:15]  Intake/Output this shift: No intake/output data recorded.  Lab Results:  Recent Labs  07/13/17 1751 07/13/17 2121 07/14/17 0506  WBC 9.1 8.2 11.6*  HGB 7.8* 7.9* 8.0*  HCT 24.2* 23.8* 24.8*  PLT 146* 144* 129*   BMET  Recent Labs  07/13/17 0605  NA 129*  K 5.0  CL 95*  CO2 23  GLUCOSE 95  BUN 61*  CREATININE 3.44*  CALCIUM 9.0    LFT  Recent Labs  07/13/17 0605  ALBUMIN 3.0*   PT/INR  Recent Labs  07/13/17 0605 07/14/17 0506  LABPROT 17.4* 15.3*  INR 1.41 1.21   Hepatitis Panel No results for input(s): HEPBSAG, HCVAB, HEPAIGM, HEPBIGM in the last 72 hours.  Studies/Results: Ct Abdomen Pelvis Wo Contrast  Result Date: 07/13/2017 CLINICAL DATA:  Dropping hemoglobin, severe low back EXAM: CT ABDOMEN AND PELVIS WITHOUT CONTRAST TECHNIQUE: Multidetector CT imaging of the abdomen and pelvis was performed following the standard protocol without IV contrast. COMPARISON:  05/30/2017 FINDINGS: Lower chest: Cardiomegaly. Trace right pleural effusion. No confluent opacities in the lung bases. Hepatobiliary: No visible focal hepatic abnormality. Gallbladder unremarkable. Slightly nodular contours of the liver surface suggests possibility of cirrhosis. Pancreas: No focal abnormality or ductal dilatation. Spleen: No focal abnormality.  Normal size. Adrenals/Urinary Tract: Large left renal calculi noted. No hydronephrosis. No stones or hydronephrosis on the right. Adrenal glands and urinary bladder grossly unremarkable. Stomach/Bowel: Large stool burden throughout the colon. Stomach and small bowel decompressed, unremarkable. Moderate amount of food material within the stomach. Vascular/Lymphatic: Aortic and iliac calcifications. No evidence of aneurysm or adenopathy. Reproductive: No visible focal abnormality. Other: Trace free fluid pelvis. No free air. No evidence of retroperitoneal bleed. Musculoskeletal: No acute bony abnormality. IMPRESSION: No explanation for the patient's decreasing hemoglobin. Left nephrolithiasis. Slightly nodular contours of the liver  suggests possibility of cirrhosis. Trace right effusion. Cardiomegaly. Large stool burden. Electronically Signed   By: Rolm Baptise M.D.   On: 07/13/2017 07:12   Dg Chest Port 1 View  Result Date: 07/13/2017 CLINICAL DATA:  Dialysis catheter placement EXAM: PORTABLE  CHEST 1 VIEW COMPARISON:  06/30/2017 FINDINGS: Right internal jugular dialysis catheter remains in place. Interval placement of tunneled dialysis catheter on the right with the tip in the SVC. No pneumothorax. Prior CABG. Heart is enlarged. Improving aeration in the right lung base. Mild residual bibasilar atelectasis. No edema or effusions. IMPRESSION: Interval placement of right internal jugular tunnel dialysis catheter with the tip in the SVC. No pneumothorax. Cardiomegaly, bibasilar atelectasis. Electronically Signed   By: Rolm Baptise M.D.   On: 07/13/2017 11:16   Scheduled Meds: . amiodarone  400 mg Oral BID  . atorvastatin  20 mg Oral Daily  . calcium acetate  667 mg Oral TID WC  . darbepoetin (ARANESP) injection - NON-DIALYSIS  200 mcg Subcutaneous Q Sat-1800  . docusate sodium  100 mg Oral BID  . feeding supplement (NEPRO CARB STEADY)  237 mL Oral q morning - 10a  . heparin  40 Units/kg Dialysis Once in dialysis  . insulin aspart  0-15 Units Subcutaneous TID WC  . insulin aspart  4 Units Subcutaneous TID WC  . insulin detemir  8 Units Subcutaneous Q2200  . metoprolol succinate  12.5 mg Oral Daily  . pantoprazole  40 mg Oral Daily  . polyethylene glycol  17 g Oral Daily  . [START ON 07/15/2017] predniSONE  20 mg Oral Q breakfast  . predniSONE  40 mg Oral Q breakfast  . sodium chloride  2 spray Each Nare BID  . sodium chloride flush  10-40 mL Intracatheter Q12H  . sodium chloride flush  3 mL Intravenous Q12H   Continuous Infusions: . sodium chloride 0  (07/01/17 1500)  . sodium chloride Stopped (07/06/17 0814)  . sodium chloride    . sodium chloride    . sodium chloride    . heparin     PRN Meds:.sodium chloride, sodium chloride, sodium chloride, acetaminophen, alteplase, diphenhydrAMINE, heparin, HYDROmorphone (DILAUDID) injection, hydrOXYzine, lidocaine (PF), lidocaine-prilocaine, oxyCODONE, pentafluoroprop-tetrafluoroeth, sodium chloride flush, sodium chloride  flush   ASSESMENT:   *  Microcytic anemia.  Anemia multifactorial:  CKD, recent cardiac cath, blood draws.   S/p PRBC x 1 today.    *  Cirrhosis of liver diagnosed 6 weeks ago at ultrasound.  Hep ABC negative.   *  AKI/CKD 4.  CVVH >> HD now.    Had AV fistula placed today.     *  Right heart failure.  Volume overload.  Responding to diuresis.    *  Previous MVR and tricuspid valve repair.    *  Chronic Coumadin for PAF.  On Heparin now.    *  IDDM, type 2.    *  OSA  *  Thrombocytopenia.      PLAN   *  Colon/EGD in coming days, when able to prep. Doesn't seem ready for this today.   *  MiraLAX for constipation, will add extra dose today.      Azucena Freed  07/14/2017, 11:42 AM Pager: [phone]

## 2017-07-14 NOTE — Progress Notes (Signed)
Patient ID: Michael [name], male   DOB: 1961/04/08, 56 y.o.   MRN: [id number]  [facility] Progress Note   Assessment/ Plan:   1. Acute on chronic diastolic heart failure (EF 50-55%) with long-standing mitral valve disease. Refractory to diuretic therapy and now on chronic hemodialysis. 2. ESRD with evidence of chronic cardiorenal syndrome exacerbated by recent CHF exacerbation, continue maintenance hemodialysis at this time with ultrafiltration. Underwent right IJ tunneled hemodialysis catheter and first stage of left brachiobasilic fistula yesterday. Continue hemodialysis on a Monday/Wednesday/Friday schedule with the process underway for outpatient dialysis unit placement. 3. Anemia:Continue Aranesp for management of anemia, status post intravenous iron. No significant postoperative hemoglobin drop noted. 4. CKD-MBD:Hyperphosphatemia noted with a normal level of calcium, will start low-dose calcium acetate.  5. Atrial fibrillation:persistent and refractory to DCCV attempt in recent past. Cannot tolerate carvedilol because of hypotension.   Subjective:   Complaints of back pain this morning and states that he cannot ambulate well because of this.   Objective:   BP (!) 94/52 (BP Location: Right Arm)   Pulse (!) 57   Temp 99.2 F (37.3 C) (Oral)   Resp 13   Ht 5\' 5"  (1.651 m)   Wt 78.6 kg (173 lb 4.8 oz)   SpO2 (!) 83%   BMI 28.84 kg/m   Physical Exam: Gen: Appears uncomfortable sitting up in recliner intermittently moaning in pain LZJ:QBHAL irregularly irregular, S1 and S2 normal  Resp:Poor inspiratory effort with decreased breath sounds over bases. Clear anteriorly  PFX:TKWI, obese, nontender  OXB:DZHGD dependent edema, no pretibial edema. Venous stasis changes left leg >right leg. Left first stage BBF site intact with palpable thrill.  Labs: BMET  Recent Labs Lab 07/07/17 1722 07/08/17 0353 07/08/17 1540 07/09/17 0443 07/10/17 0456 07/11/17 0500  07/13/17 0605  NA 134* 136 131* 137 133* 135 129*  K 4.9 4.4 5.0 3.3* 3.7 4.4 5.0  CL 100* 102 99* 99* 95* 98* 95*  CO2 25 24 22 29 27 28 23   GLUCOSE 178* 85 123* 70 87 73 95  BUN 31* 44* 55* 12 35* 27* 61*  CREATININE 2.18* 3.02* 3.57* 1.44* 2.97* 2.43* 3.44*  CALCIUM 8.8* 9.0 8.7* 8.7* 8.7* 8.7* 9.0  PHOS 4.1 4.7* 6.7* 2.8 4.2 4.1 6.0*   CBC  Recent Labs Lab 07/13/17 0605 07/13/17 1751 07/13/17 2121 07/14/17 0506  WBC 9.7 9.1 8.2 11.6*  HGB 7.0* 7.8* 7.9* 8.0*  HCT 20.8* 24.2* 23.8* 24.8*  MCV 76.2* 79.1 79.1 79.7  PLT 138* 146* 144* 129*   Medications:    . amiodarone  400 mg Oral BID  . atorvastatin  20 mg Oral Daily  . darbepoetin (ARANESP) injection - NON-DIALYSIS  200 mcg Subcutaneous Q Sat-1800  . docusate sodium  100 mg Oral BID  . feeding supplement (NEPRO CARB STEADY)  237 mL Oral q morning - 10a  . heparin  40 Units/kg Dialysis Once in dialysis  . insulin aspart  0-15 Units Subcutaneous TID WC  . insulin aspart  4 Units Subcutaneous TID WC  . insulin detemir  8 Units Subcutaneous Q2200  . pantoprazole  40 mg Oral Daily  . polyethylene glycol  17 g Oral Daily  . [START ON 07/15/2017] predniSONE  20 mg Oral Q breakfast  . predniSONE  40 mg Oral Q breakfast  . sodium chloride  2 spray Each Nare BID  . sodium chloride flush  10-40 mL Intracatheter Q12H  . sodium chloride flush  3 mL Intravenous Q12H  Elmarie Shiley, MD 07/14/2017, 8:01 AM

## 2017-07-14 NOTE — Progress Notes (Signed)
  Progress Note    07/14/2017 8:02 AM 1 Day Post-Op  Subjective:  Still having back pain  Vitals:   07/14/17 0300 07/14/17 0635  BP:    Pulse:  (!) 57  Resp: (!) 23 13  Temp:    SpO2:  (!) 83%    Physical Exam: [id number] Right ij catheter without hematoma Left upper arm palpable thrill Strong left radial pulse  CBC    Component Value Date/Time   WBC 11.6 (H) 07/14/2017 0506   RBC 3.11 (L) 07/14/2017 0506   HGB 8.0 (L) 07/14/2017 0506   HCT 24.8 (L) 07/14/2017 0506   PLT 129 (L) 07/14/2017 0506   MCV 79.7 07/14/2017 0506   MCH 25.7 (L) 07/14/2017 0506   MCHC 32.3 07/14/2017 0506   RDW 20.4 (H) 07/14/2017 0506   LYMPHSABS 1.1 02/21/2017 1846   MONOABS 0.7 02/21/2017 1846   EOSABS 0.3 02/21/2017 1846   BASOSABS 0.1 02/21/2017 1846    BMET    Component Value Date/Time   NA 129 (L) 07/13/2017 0605   NA 140 03/30/2017 1406   K 5.0 07/13/2017 0605   CL 95 (L) 07/13/2017 0605   CO2 23 07/13/2017 0605   GLUCOSE 95 07/13/2017 0605   BUN 61 (H) 07/13/2017 0605   BUN 33 (H) 03/30/2017 1406   CREATININE 3.44 (H) 07/13/2017 0605   CREATININE 1.61 (H) 01/16/2017 1653   CALCIUM 9.0 07/13/2017 0605   GFRNONAA 18 (L) 07/13/2017 0605   GFRNONAA 47 (L) 01/16/2017 1653   GFRAA 21 (L) 07/13/2017 0605   GFRAA 54 (L) 01/16/2017 1653    INR    Component Value Date/Time   INR 1.21 07/14/2017 0506     Intake/Output Summary (Last 24 hours) at 07/14/17 0802 Last data filed at 07/14/17 0300  Gross per 24 hour  Intake             1833 ml  Output             1841 ml  Net               -8 ml     Assessment:  56 y.o. male is s/p right ij tdc and left 1st stage bvt  Plan: Ok to use tdc Will f/u in 6 weeks with dialysis duplex and planning of left arm second stage bvt   Devlynn Knoff C. Donzetta Matters, MD [facility] Office: [phone] Pager: [phone]  07/14/2017 8:02 AM

## 2017-07-14 NOTE — Progress Notes (Signed)
ANTICOAGULATION CONSULT NOTE - Follow Up Consult  Pharmacy Consult for Heparin Indication: atrial fibrillation  No Known Allergies  Labs:  Recent Labs  07/12/17 0333 07/13/17 0605 07/13/17 1751 07/13/17 2121 07/14/17 0506  HGB 8.2* 7.0* 7.8* 7.9* 8.0*  HCT 24.9* 20.8* 24.2* 23.8* 24.8*  PLT 135* 138* 146* 144* 129*  LABPROT 17.8* 17.4*  --   --  15.3*  INR 1.45 1.41  --   --  1.21  HEPARINUNFRC 0.59 0.38  --   --   --   CREATININE  --  3.44*  --   --   --     Estimated Creatinine Clearance: 22.9 mL/min (A) (by C-G formula based on SCr of 3.44 mg/dL (H)).   Assessment: Pt on warfarin PTA for afib/MVR (bioprosthetic). Warfarin held pending HD access placement and he was started on a heparin bridge. S/P DCCV for Afib 8/23 but back in afib on amiodarone. Underwent fistula and TDC placement yesterday. GI consulted for drop in Hgb - plan for EGD and colonoscopy tomorrow. Ok to resume heparin. Previously therapeutic on 1300 units/hr. Will continue to hold warfarin until after GI procedures.   PTA warfarin dose was 2.5 mg daily  Goal of Therapy:  Heparin level 0.3-0.7 Monitor platelets by anticoagulation protocol: Yes   Plan:  1) Resume heparin at 1300 units/hr 2) Check 8 hour heparin level 3) Daily heparin level and CBC 4) Follow up warfarin plan after procedures tomorrow  Nena Jordan, PharmD, BCPS 07/14/2017 10:24 AM

## 2017-07-14 NOTE — Progress Notes (Signed)
Patient ID: Ercel Pepitone, male   DOB: [birth year], 56 y.o.   MRN: [id number]     Advanced Heart Failure Rounding Note  Primary Cardiologist: Dr. Harrington Challenger HF Cardiology: Aundra Dubin  Subjective:    Started CVVHD 8/15 and stopped milrinone. CVVH stopped 8/21. Had HD on 8/22 and 8/24.  On 8/23, he had TEE-guided DCCV to NSR.  Reverted back to AF 8/24  Back still sore. Hasn't had BM yet. Given laxatives this am per nurse.   INR 1.2 today. Hgb up to 8.0 with 1 UPRBCs on 8/27. GI following for colon/EGD.   Severe lower mid back pain 07/12/17. Hemoglobin down to 7.  He had CT abdomen/pelvis, no retroperitoneal hematoma or other cause of fall in hemoglobin.   Studies: Echo (7/18): EF 50-55%, mild LVH, mild AI, bioprosthetic mitral valve appeared to function normally, RV moderately dilated with moderately decreased systolic function, D-shaped interventricular septum suggestive of RV pressure/volume overload, severe TR, PASP 54 mmHg  RHC Procedural Findings: Hemodynamics (mmHg) RA mean 27 RV 63/8, mean 20 PA 58/28, mean 40 PCWP mean 28 Oxygen saturations: PA 69% AO 96% Cardiac Output (Fick) 9.82  Cardiac Index (Fick) 4.96 PVR 1.2 WU  TEE (8/23): Normal LV size and systolic function, EF 40-98%.  Normal wall thickness and motion.  D-shaped interventricular septum suggestive of RV pressure/volume overload.  The RV was severely dilated with moderately decreased systolic function.  Severe LAE, no thrombus seen.  The LA appendage appears to have been ligated.  Moderate right atrial enlargement.  No PFO/ASD by color doppler.  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  Peak RV-RA gradient 25 mmHg.  Status post mitral valve replacement with bioprosthetic valve.  Trivial MR.  Mean gradient 6 mmHg across valve, does not appear to have significant stenosis.  Trileaflet aortic valve with moderate aortic regurgitation, no significant stenosis.  Normal caliber aorta with grade III plaque in  descending thoracic aorta.   Objective:   Weight Range: 173 lb 4.8 oz (78.6 kg) Body mass index is 28.84 kg/m.   Vital Signs:   Temp:  [97 F (36.1 C)-99.2 F (37.3 C)] 98.7 F (37.1 C) (08/28 0837) Pulse Rate:  [49-142] 142 (08/28 0837) Resp:  [8-25] 9 (08/28 0837) BP: (73-135)/(45-88) 87/73 (08/28 0837) SpO2:  [83 %-100 %] 94 % (08/28 0837) Weight:  [173 lb 4.8 oz (78.6 kg)] 173 lb 4.8 oz (78.6 kg) (08/27 1245) Last BM Date: 07/10/17  Weight change: Filed Weights   07/12/17 0328 07/13/17 0543 07/13/17 1245  Weight: 161 lb 4.8 oz (73.2 kg) 166 lb 0.1 oz (75.3 kg) 173 lb 4.8 oz (78.6 kg)    Intake/Output:   Intake/Output Summary (Last 24 hours) at 07/14/17 0955 Last data filed at 07/14/17 0837  Gross per 24 hour  Intake             1833 ml  Output             1831 ml  Net                2 ml      Physical Exam   General: NAD  HEENT: Normal Neck: Supple. JVP 7-8 cm. Carotids 2+ bilat; no bruits. No thyromegaly or nodule noted. Cor: PMI nondisplaced. IRR IRR, 2.6 HSM LLSB.  Lungs: CTAB, normal effort. Abdomen: Soft, non-tender, non-distended, no HSM. No bruits or masses. +BS  Extremities: No cyanosis, clubbing, rash, R and LLE no edema.  Neuro: Alert & orientedx3, cranial nerves grossly  intact. moves all 4 extremities w/o difficulty. Affect pleasant   Telemetry   Personally reviewed, AF 120s currently in back pain.    Labs    CBC  Recent Labs  07/13/17 2121 07/14/17 0506  WBC 8.2 11.6*  HGB 7.9* 8.0*  HCT 23.8* 24.8*  MCV 79.1 79.7  PLT 144* 048*   Basic Metabolic Panel  Recent Labs  07/13/17 0605  NA 129*  K 5.0  CL 95*  CO2 23  GLUCOSE 95  BUN 61*  CREATININE 3.44*  CALCIUM 9.0  PHOS 6.0*   Liver Function Tests  Recent Labs  07/13/17 0605  ALBUMIN 3.0*   No results for input(s): LIPASE, AMYLASE in the last 72 hours. Cardiac Enzymes No results for input(s): CKTOTAL, CKMB, CKMBINDEX, TROPONINI in the last 72 hours.  BNP: BNP  (last 3 results)  Recent Labs  10/05/16 0147 05/30/17 1208 06/25/17 1253  BNP 237.4* 1,404.9* 875.7*    ProBNP (last 3 results)  Recent Labs  03/16/17 1401  PROBNP 6,530*   Imaging   Transthoracic Echocardiography 06/01/17  Study Conclusions  - Left ventricle: The cavity size was normal. There was mild   concentric hypertrophy. Systolic function was normal. The   estimated ejection fraction was in the range of 50% to 55%. Wall   motion was normal; there were no regional wall motion   abnormalities. - Ventricular septum: The contour showed diastolic flattening. - Aortic valve: Transvalvular velocity was minimally increased.   There was no stenosis. There was mild regurgitation. Peak   velocity (S): 201 cm/s. - Mitral valve: A bioprosthesis was present and functioning   normally. Mean gradient (D): 6 mm Hg. Valve area by pressure   half-time: 1.93 cm^2. - Left atrium: The atrium was severely dilated. - Right ventricle: The cavity size was moderately dilated. Wall   thickness was normal. Systolic function was moderately reduced. - Right atrium: The atrium was moderately dilated. - Tricuspid valve: There was severe regurgitation. - Pulmonary arteries: Systolic pressure was moderately increased.   PA peak pressure: 54 mm Hg (S).   RIGHT HEART CATH 06/29/17 RA mean 27 RV 63/8, mean 20 PA 58/28, mean 40 PCWP mean 28  Oxygen saturations: PA 69% AO 96%  Cardiac Output (Fick) 9.82  Cardiac Index (Fick) 4.96 PVR 1.2 WU     Medications:     Scheduled Medications: . amiodarone  400 mg Oral BID  . atorvastatin  20 mg Oral Daily  . calcium acetate  667 mg Oral TID WC  . darbepoetin (ARANESP) injection - NON-DIALYSIS  200 mcg Subcutaneous Q Sat-1800  . docusate sodium  100 mg Oral BID  . feeding supplement (NEPRO CARB STEADY)  237 mL Oral q morning - 10a  . heparin  40 Units/kg Dialysis Once in dialysis  . insulin aspart  0-15 Units Subcutaneous TID WC  .  insulin aspart  4 Units Subcutaneous TID WC  . insulin detemir  8 Units Subcutaneous Q2200  . pantoprazole  40 mg Oral Daily  . polyethylene glycol  17 g Oral Daily  . [START ON 07/15/2017] predniSONE  20 mg Oral Q breakfast  . predniSONE  40 mg Oral Q breakfast  . sodium chloride  2 spray Each Nare BID  . sodium chloride flush  10-40 mL Intracatheter Q12H  . sodium chloride flush  3 mL Intravenous Q12H    Infusions: . sodium chloride 0  (07/01/17 1500)  . sodium chloride Stopped (07/06/17 0814)  . sodium chloride    .  sodium chloride    . 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 Patient underwent mitral valve replacement and tricuspid valve repair in [place] in 2012.    Assessment/Plan   1. Acute on chronic diastolic CHF/RV failure: EF 50-55% on echo in 7/18 with prominent RV failure (enlarged/hypokinetic RV with D-shaped interventricular septum), severe TR.  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  Milrinone was started at admission for RV support, stopped now that he is getting RRT.  CVVH stopped 8/21 and had HD session on 8/22 and 8/24.   - Volume well controlled on HD. Appreciate renal care.  - Coreg stopped with low BP in HD.   2. AKI on CKD stage IV:  Now getting RRT and off milrinone.  He is going to require long-term HD, significant RV failure will complicate this but he has done well with it so far.  He lives alone and doesn't have much help with his healthcare so chance at heart-kidney transplant would be very low.  - [facility]VS has seen. He had tunneled catheter and AV fistula 07/13/17.  - Off coumadin. Remains on heparin with anemia. GI work up pending.  3. Atrial fibrillation: Persistent.  Has been in atrial fibrillation for a number of months. S/p TEE-guided DCCV on 8/23.  Back in afib on 8/24 and has stayed in afib.  - Would not repeat DCCV.   - Start Toprol Xl 12.5 mg daily, continue amiodarone for now for rate control. Rates elevated today/  - remains on heparin. Coumadin on hold for EGD/colonoscopy.   4. Anemia: He has had BRBPR but not in the last few days.   - Hgb up to 8.0 with 1 UPRBCs.  - GI following for colonoscopy/EGD.  - Started on ESA per renal 5. Bioprosthetic mitral valve:  - Stable appearance on 7/18 echo.  No change. 6. Probable sleep apnea:  - Will need sleep study outpatient. No change.  7. Acute gout in R foot:  - Prednisone restarted 8/25 x 3 days. Improved.   Length of Stay: [address]  Annamaria Helling  07/14/2017, 9:55 AM  Advanced Heart Failure Team Pager [phone] (M-F; 7a - 4p)  Please contact [facility] for night-coverage after hours (4p -7a ) and weekends on [email or web address]  Patient seen with PA, agree with the above note. Ongoing HD, had access placed yesterday in OR. Tolerating so far.   Complains of ongoing low back pain.  Nothing acute was seen CT abdomen/pelvis. Symptomatic pain control.   Anemic, had appropriate bump in hgb with 1 unit PRBCs yesterday.  GI following, will need colonoscopy/EGD.  Will leave off warfarin and on heparin gtt until then.   HR elevated today, ?related to pain.  Carefully add Toprol XL 12.5 mg daily and will titrate as able.  Will continue amiodarone for now for rate  control   [name] 07/14/2017 1:47 PM

## 2017-07-14 NOTE — Progress Notes (Signed)
Accepted at [place] .1st treatment Friday August 31,2018 at 10:45am .Tentative dialysis schedule is Monday,Wednesday,Friday  chairtime 11:50am 2nd shift

## 2017-07-14 NOTE — Clinical Social Work Note (Signed)
CSW completed patient's section of the [name] application with him. CSW left message for MD at [facility] that his portion of the application is in the chart. CSW will fax when complete.  Michael [name], [place]

## 2017-07-14 NOTE — Care Management Note (Addendum)
Case Management Note Previous Note Created by Michael [name] Michael/18  Patient Details  Name: Michael [name] MRN: [id number] Date of Birth: [birth year]  Subjective/Objective:    From home alone, hea has a daughter who works and goes to school every day, Patient is established with [facility] [name] will reschedule patient's follow up appt when he is near discharge. He is also active with [facility] for [name], and HHPT, also will be followed by Michael [name].  He has Acute /Chronic CHF, AKI, CKD IV, afib,  Anemia, bioprosthetic MV.  CVP remains elevated , HD cath place 8/14 and CRRT started for volume removal.  Plan for TEE and DCCV when volume is better per MD  Note.      Michael [name], BSN - NCM spoke with patient and informed him that he will be with [facility] for Michael [name] , he states that is fine ,Michael [name] is aware. He will need daily weights and if needed iv lasix at home, Per MD they do give iv lasix thru [name] agency at home.  He conts to receive CRRT today                Action/Plan:   Expected Discharge Date:                  Expected Discharge Plan:  Michael [name]  In-House Referral:  NA  Discharge planning Services  CM Consult, Follow-up appt scheduled  Post Acute Care Choice:  Home Health Choice offered to:  Patient  DME Arranged:    DME Agency:     HH Arranged:  RN, PT [place] Agency:  [facility]  Status of Service:  In process, will continue to follow  If discussed at Long Length of Stay Meetings, dates discussed:    Additional Comments: 07/14/2017  Clipping process is complete, pt has been established with [name] [address] [name] .  CM confirmed that pt will have transportation provided by Michael [name] to PCP appts.  (Michael [name] gave [name] application directly to pt 07/10/17).  Michael [name] went to bedside and completed application -  Michael [name] will fast track application to facilitate transportation  to and from HD sessions  (first session tentative for 07/17/17).  Both CM and Michael [name] contacted daughter via phone but was unable to leave voicemail.  Pt states he has not talked to daughter today.  Attending aware that [name] application requires signature.  07/10/17 Clipping process initiated 8/24  Pt discussed in LOS 8/23 - remains appropriate for continued stay.  Pt failed Cardioversion  07/09/17 and remains on IV heparin.  Pt has been deemed long term HD - last session was 8/22.  Perm access scheduled for next week.  CM left message with HD to inquire about clipping process  07/08/17 Pt transferred from [name].  Pt came of CRRT 07/06/17 - and will now start on IHD - daily assessments as pt is new to HD.  CM confirmed with HD unit that clipping has not started yet due to uncertainty of HD long term.  Pt is active with [name] program and both [facility] and [name] following.  CM requested [facility] to assist with transportation to appts - [name] CM will also consult with [name] SW for community resources including transportation.  Palliative following for Michael Roger, RN 07/14/2017, 4:05 PM

## 2017-07-14 NOTE — Progress Notes (Signed)
PT Cancellation Note  Patient Details Name: Michael [name] MRN: [id number] DOB: June 03, 1961   Cancelled Treatment:    Reason Eval/Treat Not Completed: Patient declined, no reason specified pt declined mobility due to back pain. PT will continue to follow acutely.    Salina April, PTA Pager: [phone]   07/14/2017, 11:56 AM

## 2017-07-14 NOTE — Progress Notes (Signed)
ANTICOAGULATION CONSULT NOTE - Follow Up Consult  Pharmacy Consult for Heparin Indication: atrial fibrillation  No Known Allergies  Labs:  Recent Labs  07/12/17 0333 07/13/17 0605 07/13/17 1751 07/13/17 2121 07/14/17 0506 07/14/17 1932  HGB 8.2* 7.0* 7.8* 7.9* 8.0*  --   HCT 24.9* 20.8* 24.2* 23.8* 24.8*  --   PLT 135* 138* 146* 144* 129*  --   LABPROT 17.8* 17.4*  --   --  15.3*  --   INR 1.45 1.41  --   --  1.21  --   HEPARINUNFRC 0.59 0.38  --   --   --  0.21*  CREATININE  --  3.44*  --   --   --   --     Estimated Creatinine Clearance: 22.9 mL/min (A) (by C-G formula based on SCr of 3.44 mg/dL (H)).   Assessment: Pt on warfarin PTA for afib/MVR (bioprosthetic). Warfarin held pending HD access placement and he was started on a heparin bridge. S/P DCCV for Afib 8/23 but back in afib on amiodarone. Underwent fistula and TDC placement yesterday. GI consulted for drop in Hgb - plan for EGD and colonoscopy tomorrow. Ok to resume heparin 1300 units/hr HL 0.21 < goal.    Will continue to hold warfarin until after GI procedures.   PTA warfarin dose was 2.5 mg daily  Goal of Therapy:  Heparin level 0.3-0.7 Monitor platelets by anticoagulation protocol: Yes   Plan:  1) Increase heparin at 1400 units/hr 2) Daily heparin level and CBC 3) Follow up warfarin plan after procedures tomorrow  Bonnita Nasuti Pharm.D. CPP, BCPS Clinical Pharmacist [phone] 07/14/2017 8:48 PM

## 2017-07-15 ENCOUNTER — Encounter: Payer: Self-pay | Admitting: *Deleted

## 2017-07-15 DIAGNOSIS — K625 Hemorrhage of anus and rectum: Secondary | ICD-10-CM

## 2017-07-15 LAB — CBC
HCT: 21.5 % — ABNORMAL LOW (ref 39.0–52.0)
Hemoglobin: 7 g/dL — ABNORMAL LOW (ref 13.0–17.0)
MCH: 26 pg (ref 26.0–34.0)
MCHC: 32.6 g/dL (ref 30.0–36.0)
MCV: 79.9 fL (ref 78.0–100.0)
PLATELETS: 137 10*3/uL — AB (ref 150–400)
RBC: 2.69 MIL/uL — ABNORMAL LOW (ref 4.22–5.81)
RDW: 21 % — AB (ref 11.5–15.5)
WBC: 13 10*3/uL — ABNORMAL HIGH (ref 4.0–10.5)

## 2017-07-15 LAB — RENAL FUNCTION PANEL
ANION GAP: 11 (ref 5–15)
Albumin: 3 g/dL — ABNORMAL LOW (ref 3.5–5.0)
BUN: 55 mg/dL — ABNORMAL HIGH (ref 6–20)
CO2: 25 mmol/L (ref 22–32)
Calcium: 8.8 mg/dL — ABNORMAL LOW (ref 8.9–10.3)
Chloride: 94 mmol/L — ABNORMAL LOW (ref 101–111)
Creatinine, Ser: 3.62 mg/dL — ABNORMAL HIGH (ref 0.61–1.24)
GFR calc Af Amer: 20 mL/min — ABNORMAL LOW (ref >60)
GFR calc non Af Amer: 17 mL/min — ABNORMAL LOW (ref >60)
GLUCOSE: 134 mg/dL — AB (ref 65–99)
POTASSIUM: 5 mmol/L (ref 3.5–5.1)
Phosphorus: 5.1 mg/dL — ABNORMAL HIGH (ref 2.5–4.6)
Sodium: 130 mmol/L — ABNORMAL LOW (ref 135–145)

## 2017-07-15 LAB — GLUCOSE, CAPILLARY
GLUCOSE-CAPILLARY: 78 mg/dL (ref 65–99)
GLUCOSE-CAPILLARY: 72 mg/dL (ref 65–99)
GLUCOSE-CAPILLARY: 114 mg/dL — AB (ref 65–99)
Glucose-Capillary: 104 mg/dL — ABNORMAL HIGH (ref 65–99)

## 2017-07-15 LAB — HEPARIN LEVEL (UNFRACTIONATED): Heparin Unfractionated: 0.43 IU/mL (ref 0.30–0.70)

## 2017-07-15 LAB — MITOCHONDRIAL ANTIBODIES: Mitochondrial M2 Ab, IgG: 3.8 Units (ref 0.0–20.0)

## 2017-07-15 LAB — PROTIME-INR
INR: 1.34
Prothrombin Time: 16.4 seconds — ABNORMAL HIGH (ref 11.4–15.2)

## 2017-07-15 LAB — ANTINUCLEAR ANTIBODIES, IFA: ANA Ab, IFA: NEGATIVE

## 2017-07-15 LAB — ANTI-SMOOTH MUSCLE ANTIBODY, IGG: F-Actin IgG: 13 Units (ref 0–19)

## 2017-07-15 MED ORDER — METOCLOPRAMIDE HCL 5 MG/ML IJ SOLN: 10.0000 mg | Freq: Once | INTRAMUSCULAR | Status: DC

## 2017-07-15 MED ORDER — KETOROLAC TROMETHAMINE 15 MG/ML IJ SOLN
15.0000 mg | Freq: Once | INTRAMUSCULAR | Status: AC
  Administered 2017-07-15: 15 mg via INTRAVENOUS
  Filled 2017-07-15: qty 1

## 2017-07-15 MED ORDER — SODIUM CHLORIDE 0.9 % IV SOLN
125.0000 mg | INTRAVENOUS | Status: DC
  Administered 2017-07-15 – 2017-07-27 (×6): 125 mg via INTRAVENOUS
  Filled 2017-07-15 (×13): qty 10

## 2017-07-15 MED ORDER — CALCIUM ACETATE (PHOS BINDER) 667 MG PO CAPS
667.0000 mg | ORAL_CAPSULE | Freq: Three times a day (TID) | ORAL | Status: DC
  Administered 2017-07-15 – 2017-07-27 (×26): 667 mg via ORAL
  Filled 2017-07-15 (×26): qty 1

## 2017-07-15 MED ORDER — METOCLOPRAMIDE HCL 5 MG/ML IJ SOLN
10.0000 mg | Freq: Once | INTRAMUSCULAR | Status: AC
  Administered 2017-07-16: 10 mg via INTRAVENOUS
  Filled 2017-07-15: qty 2

## 2017-07-15 MED ORDER — PREDNISONE 20 MG PO TABS
20.0000 mg | ORAL_TABLET | Freq: Every day | ORAL | Status: DC
  Administered 2017-07-15 – 2017-07-21 (×7): 20 mg via ORAL
  Filled 2017-07-15 (×7): qty 1

## 2017-07-15 MED ORDER — PEG-KCL-NACL-NASULF-NA ASC-C 100 G PO SOLR
1.0000 | Freq: Once | ORAL | Status: AC
  Filled 2017-07-15: qty 1

## 2017-07-15 MED ORDER — NEPRO/CARBSTEADY PO LIQD
237.0000 mL | Freq: Every morning | ORAL | Status: DC
  Administered 2017-07-17 – 2017-07-26 (×6): 237 mL via ORAL
  Filled 2017-07-15 (×12): qty 237

## 2017-07-15 NOTE — Clinical Social Work Note (Addendum)
CSW coworker went to HD unit to see if [name] application has been filled out by MD. Still not filled out. CSW will check again later.  Dayton Scrape, [place] [phone]  2:07 pm CSW attempted to call patient's daughter to see if she would be able to transport patient to dialysis at all. No answer. Voicemail is full. [name] application not filled out by MD yet.  Dayton Scrape, [place]

## 2017-07-15 NOTE — Progress Notes (Signed)
Physical Therapy Treatment Patient Details Name: Michael [name] MRN: [id number] DOB: 02/12/1961 Today's Date: 07/15/2017    History of Present Illness HPI: Michael [name] is a 56 y.o. male with medical history significant of anemia of chronic disease, chronic kidney disease, systolic heart failure, diabetes mellitus, GERD, history of tricuspid valve repair, essential hypertension, atrial fibrillation, pulmonary hypertension. Patient presented from his PCPs office for evaluation of anasarca. Acute kidney injury as well with cardiorenal syndrome. Pt currently on CRRT (began 8/14).     PT Comments    Patient agreeable to OOB mobility but limited by low back pain. Pt required assist and RW for ambulation of 151ft and unable to achieve neutral trunk extension. Pt has very limited mobility compared to previous sessions due to increased back pain with ambulation. Continue to progress as tolerated.    Follow Up Recommendations  Home health PT     Equipment Recommendations  Rolling walker with 5" wheels    Recommendations for Other Services       Precautions / Restrictions Precautions Precautions: Fall Restrictions Weight Bearing Restrictions: No    Mobility  Bed Mobility Overal bed mobility: Modified Independent Bed Mobility: Supine to Sit;Sit to Supine           General bed mobility comments: increased time and effort  Transfers Overall transfer level: Needs assistance Equipment used: Rolling walker (2 wheeled) Transfers: Sit to/from Stand Sit to Stand: Min assist         General transfer comment: assist to power up into standing; cues for safe use of AD; pt unable to achieve full trunk extension due to pain  Ambulation/Gait Ambulation/Gait assistance: Min assist Ambulation Distance (Feet): 90 Feet Assistive device: Rolling walker (2 wheeled) Gait Pattern/deviations: Step-through pattern;Decreased stride length;Trunk flexed Gait velocity: decreased     General Gait Details: assist to steady and for posture; multiodal cues for posture/forward gaze and for safe use of AD; pt with increased low back pain with trunk extension and ambulation and began resting forearms on RW   Stairs            Wheelchair Mobility    Modified Rankin (Stroke Patients Only)       Balance Overall balance assessment: Needs assistance Sitting-balance support: No upper extremity supported;Feet supported Sitting balance-Leahy Scale: Good     Standing balance support: During functional activity;Bilateral upper extremity supported Standing balance-Leahy Scale: Poor                              Cognition Arousal/Alertness: Awake/alert Behavior During Therapy: WFL for tasks assessed/performed Overall Cognitive Status: Within Functional Limits for tasks assessed                                        Exercises      General Comments        Pertinent Vitals/Pain Pain Assessment: Faces Faces Pain Scale: Hurts whole lot Pain Location: low back Pain Descriptors / Indicators: Grimacing;Guarding;Moaning;Sharp;Sore Pain Intervention(s): Limited activity within patient's tolerance;Monitored during session;Premedicated before session;Repositioned    Home Living                      Prior Function            PT Goals (current goals can now be found in the care plan section) Acute Rehab PT Goals PT  Goal Formulation: With patient Time For Goal Achievement: 07/16/17 Potential to Achieve Goals: Good Progress towards PT goals: Not progressing toward goals - comment (limited by pain)    Frequency    Min 3X/week      PT Plan Current plan remains appropriate    Co-evaluation              AM-PAC PT "6 Clicks" Daily Activity  Outcome Measure  Difficulty turning over in bed (including adjusting bedclothes, sheets and blankets)?: None Difficulty moving from lying on back to sitting on the side of the  bed? : None Difficulty sitting down on and standing up from a chair with arms (e.g., wheelchair, bedside commode, etc,.)?: Unable Help needed moving to and from a bed to chair (including a wheelchair)?: A Little Help needed walking in hospital room?: A Lot Help needed climbing 3-5 steps with a railing? : A Lot 6 Click Score: 16    End of Session Equipment Utilized During Treatment: Gait belt Activity Tolerance: Patient limited by pain Patient left: with call bell/phone within reach;in bed Nurse Communication: Mobility status PT Visit Diagnosis: Unsteadiness on feet (R26.81);Muscle weakness (generalized) (M62.81)     Time: 6153-7943 PT Time Calculation (min) (ACUTE ONLY): 19 min  Charges:  $Gait Training: 8-22 mins                    G Codes:       Earney Navy, PTA Pager: [phone]     Darliss Cheney 07/15/2017, 4:20 PM

## 2017-07-15 NOTE — Procedures (Signed)
Patient seen on Hemodialysis. QB 400, UF goal 2.5L (currently off because of hypotension 85/65) Treatment adjusted as needed.  Elmarie Shiley MD [facility]. Office # [phone] Pager # [phone] 9:14 AM

## 2017-07-15 NOTE — Progress Notes (Signed)
ANTICOAGULATION CONSULT NOTE - Follow Up Consult  Pharmacy Consult for Heparin Indication: atrial fibrillation  No Known Allergies  Labs:  Recent Labs  07/13/17 0605  07/13/17 2121 07/14/17 0506 07/14/17 1932 07/15/17 0947 07/15/17 0948 07/15/17 0950  HGB 7.0*  < > 7.9* 8.0*  --   --   --  7.0*  HCT 20.8*  < > 23.8* 24.8*  --   --   --  21.5*  PLT 138*  < > 144* 129*  --   --   --  137*  LABPROT 17.4*  --   --  15.3*  --  16.4*  --   --   INR 1.41  --   --  1.21  --  1.34  --   --   HEPARINUNFRC 0.38  --   --   --  0.21* 0.43  --   --   CREATININE 3.44*  --   --   --   --   --  3.62*  --   < > = values in this interval not displayed.  Estimated Creatinine Clearance: 21.9 mL/min (A) (by C-G formula based on SCr of 3.62 mg/dL (H)).   Medications: Heparin @ 1400 units/hr  Assessment: Pt on warfarin PTA for afib/MVR (bioprosthetic). Warfarin held pending HD access placement and he was started on a heparin bridge. S/P DCCV for Afib 8/23 but back in afib on amiodarone. Underwent fistula and TDC placement 8/27. Heparin resumed 8/28. Heparin level is therapeutic at 0.43.  GI consulted for drop in Hgb - plan for EGD and colonoscopy tomorrow. Will continue to hold warfarin until after GI procedures.   PTA warfarin dose was 2.5 mg daily  Goal of Therapy:  Heparin level 0.3-0.7 Monitor platelets by anticoagulation protocol: Yes   Plan:  1) Continue heparin at 1400 units/hr 2) Daily heparin level and CBC 3) Follow up warfarin plan after procedures tomorrow  Nena Jordan, PharmD, BCPS 07/15/2017 11:32 AM

## 2017-07-15 NOTE — Care Management Important Message (Signed)
Important Message  Patient Details  Name: Michael [name] MRN: [id number] Date of Birth: 03-Jul-1961   Medicare Important Message Given:  Yes    Jaala Bohle 07/15/2017, 12:13 PM

## 2017-07-15 NOTE — Progress Notes (Signed)
Patient ID: Michael [name], male   DOB: 13-Oct-1961, 56 y.o.   MRN: [id number]      Advanced Heart Failure Rounding Note  Primary Cardiologist: Dr. Harrington Challenger HF Cardiology: Aundra Dubin  Subjective:    Started CVVHD 8/15 and stopped milrinone. CVVH stopped 8/21. Currently getting iHD.  Tolerating so far though BP drops.   On 8/23, he had TEE-guided DCCV to NSR.  Reverted back to AF 8/24  Still with back pain, neck pain.   Pending CBC today. GI following for colon/EGD.   He had CT abdomen/pelvis 8/27: no retroperitoneal hematoma or other cause of fall in hemoglobin or back pain.   Studies: Echo (7/18): EF 50-55%, mild LVH, mild AI, bioprosthetic mitral valve appeared to function normally, RV moderately dilated with moderately decreased systolic function, D-shaped interventricular septum suggestive of RV pressure/volume overload, severe TR, PASP 54 mmHg  [facility] Procedural Findings: Hemodynamics (mmHg) RA mean 27 RV 63/8, mean 20 PA 58/28, mean 40 PCWP mean 28 Oxygen saturations: PA 69% AO 96% Cardiac Output (Fick) 9.82  Cardiac Index (Fick) 4.96 PVR 1.2 WU  TEE (8/23): Normal LV size and systolic function, EF 66-29%.  Normal wall thickness and motion.  D-shaped interventricular septum suggestive of RV pressure/volume overload.  The RV was severely dilated with moderately decreased systolic function.  Severe LAE, no thrombus seen.  The LA appendage appears to have been ligated.  Moderate right atrial enlargement.  No PFO/ASD by color doppler.  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  Peak RV-RA gradient 25 mmHg.  Status post mitral valve replacement with bioprosthetic valve.  Trivial MR.  Mean gradient 6 mmHg across valve, does not appear to have significant stenosis.  Trileaflet aortic valve with moderate aortic regurgitation, no significant stenosis.  Normal caliber aorta with grade III plaque in descending thoracic aorta.   Objective:   Weight Range: 176 lb 5.9 oz (80  kg) Body mass index is 29.35 kg/m.   Vital Signs:   Temp:  [97.4 F (36.3 C)-98.8 F (37.1 C)] 97.9 F (36.6 C) (08/29 0738) Pulse Rate:  [80-131] 110 (08/29 0830) Resp:  [14-20] 17 (08/29 0830) BP: (78-110)/(45-82) 78/45 (08/29 0830) SpO2:  [97 %-99 %] 98 % (08/29 0738) Weight:  [170 lb 9.6 oz (77.4 kg)-176 lb 5.9 oz (80 kg)] 176 lb 5.9 oz (80 kg) (08/29 0738) Last BM Date: 07/14/17  Weight change: Filed Weights   07/13/17 1245 07/15/17 0503 07/15/17 0738  Weight: 173 lb 4.8 oz (78.6 kg) 170 lb 9.6 oz (77.4 kg) 176 lb 5.9 oz (80 kg)    Intake/Output:   Intake/Output Summary (Last 24 hours) at 07/15/17 0941 Last data filed at 07/15/17 0505  Gross per 24 hour  Intake          1532.87 ml  Output              175 ml  Net          1357.87 ml      Physical Exam   General: Uncomfortable, on dialysis Neck: JVP 8 cm, no thyromegaly or thyroid nodule.  Lungs: Clear to auscultation bilaterally with normal respiratory effort. CV: Nondisplaced PMI.  Heart mildly tachy, irregular S1/S2, no S3/S4, 2/6 HSM LLSB.  No peripheral edema.   Abdomen: Soft, nontender, no hepatosplenomegaly, no distention.  Skin: Intact without lesions or rashes.  Neurologic: Alert and oriented x 3.  Psych: Normal affect. Extremities: No clubbing or cyanosis.  HEENT: Normal.    Telemetry   Personally  reviewed, AF 100s   Labs    CBC  Recent Labs  07/13/17 2121 07/14/17 0506  WBC 8.2 11.6*  HGB 7.9* 8.0*  HCT 23.8* 24.8*  MCV 79.1 79.7  PLT 144* 413*   Basic Metabolic Panel  Recent Labs  07/13/17 0605  NA 129*  K 5.0  CL 95*  CO2 23  GLUCOSE 95  BUN 61*  CREATININE 3.44*  CALCIUM 9.0  PHOS 6.0*   Liver Function Tests  Recent Labs  07/13/17 0605  ALBUMIN 3.0*   No results for input(s): LIPASE, AMYLASE in the last 72 hours. Cardiac Enzymes No results for input(s): CKTOTAL, CKMB, CKMBINDEX, TROPONINI in the last 72 hours.  BNP: BNP (last 3 results)  Recent Labs   10/05/16 0147 05/30/17 1208 06/25/17 1253  BNP 237.4* 1,404.9* 875.7*    ProBNP (last 3 results)  Recent Labs  03/16/17 1401  PROBNP 6,530*   Imaging   Transthoracic Echocardiography 06/01/17  Study Conclusions  - Left ventricle: The cavity size was normal. There was mild   concentric hypertrophy. Systolic function was normal. The   estimated ejection fraction was in the range of 50% to 55%. Wall   motion was normal; there were no regional wall motion   abnormalities. - Ventricular septum: The contour showed diastolic flattening. - Aortic valve: Transvalvular velocity was minimally increased.   There was no stenosis. There was mild regurgitation. Peak   velocity (S): 201 cm/s. - Mitral valve: A bioprosthesis was present and functioning   normally. Mean gradient (D): 6 mm Hg. Valve area by pressure   half-time: 1.93 cm^2. - Left atrium: The atrium was severely dilated. - Right ventricle: The cavity size was moderately dilated. Wall   thickness was normal. Systolic function was moderately reduced. - Right atrium: The atrium was moderately dilated. - Tricuspid valve: There was severe regurgitation. - Pulmonary arteries: Systolic pressure was moderately increased.   PA peak pressure: 54 mm Hg (S).   RIGHT HEART CATH 06/29/17 RA mean 27 RV 63/8, mean 20 PA 58/28, mean 40 PCWP mean 28  Oxygen saturations: PA 69% AO 96%  Cardiac Output (Fick) 9.82  Cardiac Index (Fick) 4.96 PVR 1.2 WU     Medications:     Scheduled Medications: . amiodarone  400 mg Oral BID  . atorvastatin  20 mg Oral Daily  . calcium acetate  667 mg Oral TID WC  . darbepoetin (ARANESP) injection - NON-DIALYSIS  200 mcg Subcutaneous Q Sat-1800  . docusate sodium  100 mg Oral BID  . feeding supplement (NEPRO CARB STEADY)  237 mL Oral q morning - 10a  . heparin  40 Units/kg Dialysis Once in dialysis  . insulin aspart  0-15 Units Subcutaneous TID WC  . insulin aspart  4 Units Subcutaneous  TID WC  . insulin detemir  8 Units Subcutaneous Q2200  . ketorolac  15 mg Intravenous Once  . metoprolol succinate  12.5 mg Oral Daily  . pantoprazole  40 mg Oral Daily  . polyethylene glycol  17 g Oral Daily  . predniSONE  20 mg Oral Q breakfast  . sodium chloride  2 spray Each Nare BID  . sodium chloride flush  10-40 mL Intracatheter Q12H  . sodium chloride flush  3 mL Intravenous Q12H    Infusions: . sodium chloride 0  (07/01/17 1500)  . sodium chloride Stopped (07/06/17 0814)  . sodium chloride    . sodium chloride    . sodium chloride    .  ferric gluconate (FERRLECIT/NULECIT) IV    . 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 Patient underwent mitral valve replacement and tricuspid valve repair in [place] in 2012.    Assessment/Plan   1. Acute on chronic diastolic CHF/RV failure: EF 50-55% on echo in 7/18 with prominent RV failure (enlarged/hypokinetic RV with D-shaped interventricular septum), severe TR.  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  Milrinone was started at admission for RV support, stopped now that he is getting RRT.  CVVH stopped 8/21 and has been getting regular iHD since then.  Seen on HD this morning.    -  Volume well controlled on HD. Appreciate renal care.  - Coreg stopped with low BP in HD.   2. AKI on CKD stage IV: Now getting RRT and off milrinone.  He is going to require long-term HD, significant RV failure will complicate this but he has done well with it so far.  He lives alone and doesn't have much help with his healthcare so chance at heart-kidney transplant would be very low.  - [facility] has seen. He had tunneled catheter and AV fistula 07/13/17.  3. Atrial fibrillation: Persistent.  Has been in atrial fibrillation for a number of months. S/p TEE-guided DCCV on 8/23.  Back in afib on 8/24 and has stayed in afib.  - Would not repeat DCCV.   - Continue Toprol XL 12.5 mg daily, continue amiodarone for now for rate control. HR in 100s today, this is the best we have had him.  Meds limited by hypotension.  - remains on heparin. Coumadin on hold for EGD/colonoscopy.   4. Anemia: He has had BRBPR but not in the last few days.  Transfusion this admission.  - Pending CBC today.  - GI following for colonoscopy/EGD => could we do this tomorrow? Once GI workup is completed, will try to get him home.  - Started on ESA per renal 5. Bioprosthetic mitral valve: Stable appearance on 7/18 echo.  No change. 6. Probable sleep apnea: Will need sleep study outpatient. No change.  7. Acute gout in R foot: Prednisone 8/25 x 3 days. Improved.  8. Low back pain: Ongoing.  CT did not show RP hematoma or other cause for back pain. Renal to give toradol now.   Length of Stay: 36  [name], MD  07/15/2017, 9:41 AM  Advanced Heart Failure Team Pager [phone] (M-F; 7a - 4p)  Please contact [facility] for night-coverage after hours (4p -7a ) and weekends on [email or web address]

## 2017-07-15 NOTE — Progress Notes (Signed)
PT Cancellation Note  Patient Details Name: Michael [name] MRN: [id number] DOB: 1961/09/02   Cancelled Treatment:    Reason Eval/Treat Not Completed: Patient at procedure or test/unavailable Pt in HD. PT will check on pt later as time allows.    Salina April, PTA Pager: [phone]   07/15/2017, 8:46 AM

## 2017-07-15 NOTE — Progress Notes (Signed)
Patient ID: Michael [name], male   DOB: 06/14/1961, 56 y.o.   MRN: [id number]  [facility] Progress Note   Assessment/ Plan:   1. Acute on chronic diastolic heart failure (EF 50-55%) with long-standing mitral valve disease. Started on dialysis after refractoriness to diuretic/medical management. 2. ESRD with evidence of chronic cardiorenal syndrome exacerbated by recent CHF exacerbation, continue maintenance hemodialysis at this time with ultrafiltration. Continue hemodialysis on a Monday/Wednesday/Friday schedule-he has been accepted to [facility] and can begin there on Friday 8/31.  3. Anemia:Continue Aranesp for management of anemia, persistent iron deficiency noted with iron saturation 14% and ferritin 265-will re-dose with intravenous iron. 4. CKD-MBD:Hyperphosphatemia noted with a normal level of calcium, will start low-dose calcium acetate.  5. Atrial fibrillation:persistent and refractory to DCCV attempt in recent past. Unable to tolerate beta blocker due to relative hypotension   Subjective:   Continue to have intermittent back pain overnight and struggles with fluid restriction .   Objective:   BP (!) 106/56 (BP Location: Right Arm)   Pulse (!) 106   Temp 97.9 F (36.6 C) (Oral)   Resp 19   Ht 5\' 5"  (1.651 m)   Wt 80 kg (176 lb 5.9 oz)   SpO2 98%   BMI 29.35 kg/m   Physical Exam: Gen: Appears comfortable resting in dialysis KTG:YBWLS irregularly irregular, S1 and S2 normal  Resp:Poor inspiratory effort with decreased breath sounds over bases. Clear anteriorly  LHT:DSKA, obese, nontender  JGO:TLXBW dependent edema, no pretibial edema. Venous stasis changes left leg >right leg. Left first stage BBF site intact with palpable thrill.  Labs: BMET  Recent Labs Lab 07/08/17 1540 07/09/17 0443 07/10/17 0456 07/11/17 0500 07/13/17 0605  NA 131* 137 133* 135 129*  K 5.0 3.3* 3.7 4.4 5.0  CL 99* 99* 95* 98* 95*  CO2 22 29 27 28 23    GLUCOSE 123* 70 87 73 95  BUN 55* 12 35* 27* 61*  CREATININE 3.57* 1.44* 2.97* 2.43* 3.44*  CALCIUM 8.7* 8.7* 8.7* 8.7* 9.0  PHOS 6.7* 2.8 4.2 4.1 6.0*   CBC  Recent Labs Lab 07/13/17 0605 07/13/17 1751 07/13/17 2121 07/14/17 0506  WBC 9.7 9.1 8.2 11.6*  HGB 7.0* 7.8* 7.9* 8.0*  HCT 20.8* 24.2* 23.8* 24.8*  MCV 76.2* 79.1 79.1 79.7  PLT 138* 146* 144* 129*   Medications:    . amiodarone  400 mg Oral BID  . atorvastatin  20 mg Oral Daily  . calcium acetate  667 mg Oral TID WC  . darbepoetin (ARANESP) injection - NON-DIALYSIS  200 mcg Subcutaneous Q Sat-1800  . docusate sodium  100 mg Oral BID  . feeding supplement (NEPRO CARB STEADY)  237 mL Oral q morning - 10a  . heparin  40 Units/kg Dialysis Once in dialysis  . insulin aspart  0-15 Units Subcutaneous TID WC  . insulin aspart  4 Units Subcutaneous TID WC  . insulin detemir  8 Units Subcutaneous Q2200  . metoprolol succinate  12.5 mg Oral Daily  . pantoprazole  40 mg Oral Daily  . polyethylene glycol  17 g Oral Daily  . predniSONE  20 mg Oral Q breakfast  . sodium chloride  2 spray Each Nare BID  . sodium chloride flush  10-40 mL Intracatheter Q12H  . sodium chloride flush  3 mL Intravenous Q12H   Elmarie Shiley, MD 07/15/2017, 8:25 AM

## 2017-07-15 NOTE — Progress Notes (Signed)
Daily Rounding Note  07/15/2017, 10:44 AM  LOS: 18 days   SUBJECTIVE:   Chief complaint:  Pain in low back and knee pain.    Received notification that Dr. Benjamine Mola feels that the patient is stable for colonoscopy/upper endoscopy tomorrow. Currently in hemodialysis where he was seen.  Has had some dialysis associated hypotension.  OBJECTIVE:         Vital signs in last 24 hours:    Temp:  [97.4 F (36.3 C)-98.8 F (37.1 C)] 97.9 F (36.6 C) (08/29 0738) Pulse Rate:  [80-131] 124 (08/29 1000) Resp:  [14-20] 18 (08/29 1000) BP: (78-110)/(45-82) 85/56 (08/29 1000) SpO2:  [97 %-99 %] 99 % (08/29 0930) Weight:  [77.4 kg (170 lb 9.6 oz)-80 kg (176 lb 5.9 oz)] 80 kg (176 lb 5.9 oz) (08/29 0738) Last BM Date: 07/14/17 Filed Weights   07/13/17 1245 07/15/17 0503 07/15/17 0738  Weight: 78.6 kg (173 lb 4.8 oz) 77.4 kg (170 lb 9.6 oz) 80 kg (176 lb 5.9 oz)   General: anxious, uncomfortable and restless   Heart: RRR Chest: clear bil Abdomen: soft, NT, ND.  Hypoactive BS  Extremities: bruising in left arm.  No pedal edema Neuro/Psych:  Anxious, oriented x 3.  Moving all 4s.    Intake/Output from previous day: 08/28 0701 - 08/29 0700 In: 1772.9 [P.O.:1720; I.V.:52.9] Out: 175 [Urine:175]  Intake/Output this shift: No intake/output data recorded.  Lab Results:  Recent Labs  07/13/17 2121 07/14/17 0506 07/15/17 0950  WBC 8.2 11.6* 13.0*  HGB 7.9* 8.0* 7.0*  HCT 23.8* 24.8* 21.5*  PLT 144* 129* 137*   BMET  Recent Labs  07/13/17 0605 07/15/17 0948  NA 129* 130*  K 5.0 5.0  CL 95* 94*  CO2 23 25  GLUCOSE 95 134*  BUN 61* 55*  CREATININE 3.44* 3.62*  CALCIUM 9.0 8.8*   LFT  Recent Labs  07/13/17 0605 07/15/17 0948  ALBUMIN 3.0* 3.0*   PT/INR  Recent Labs  07/13/17 0605 07/14/17 0506  LABPROT 17.4* 15.3*  INR 1.41 1.21   Hepatitis Panel No results for input(s): HEPBSAG, HCVAB, HEPAIGM,  HEPBIGM in the last 72 hours.  Studies/Results: Dg Chest Port 1 View  Result Date: 07/13/2017 CLINICAL DATA:  Dialysis catheter placement EXAM: PORTABLE CHEST 1 VIEW COMPARISON:  06/30/2017 FINDINGS: Right internal jugular dialysis catheter remains in place. Interval placement of tunneled dialysis catheter on the right with the tip in the SVC. No pneumothorax. Prior CABG. Heart is enlarged. Improving aeration in the right lung base. Mild residual bibasilar atelectasis. No edema or effusions. IMPRESSION: Interval placement of right internal jugular tunnel dialysis catheter with the tip in the SVC. No pneumothorax. Cardiomegaly, bibasilar atelectasis. Electronically Signed   By: Rolm Baptise M.D.   On: 07/13/2017 11:16   Scheduled Meds: . amiodarone  400 mg Oral BID  . atorvastatin  20 mg Oral Daily  . calcium acetate  667 mg Oral TID WC  . darbepoetin (ARANESP) injection - NON-DIALYSIS  200 mcg Subcutaneous Q Sat-1800  . docusate sodium  100 mg Oral BID  . feeding supplement (NEPRO CARB STEADY)  237 mL Oral q morning - 10a  . heparin  40 Units/kg Dialysis Once in dialysis  . insulin aspart  0-15 Units Subcutaneous TID WC  . insulin aspart  4 Units Subcutaneous TID WC  . insulin detemir  8 Units Subcutaneous Q2200  . metoprolol succinate  12.5 mg Oral Daily  .  pantoprazole  40 mg Oral Daily  . polyethylene glycol  17 g Oral Daily  . predniSONE  20 mg Oral Q breakfast  . sodium chloride  2 spray Each Nare BID  . sodium chloride flush  10-40 mL Intracatheter Q12H  . sodium chloride flush  3 mL Intravenous Q12H   Continuous Infusions: . sodium chloride 0  (07/01/17 1500)  . sodium chloride Stopped (07/06/17 0814)  . sodium chloride    . sodium chloride    . sodium chloride    . ferric gluconate (FERRLECIT/NULECIT) IV    . heparin 1,400 Units/hr (07/14/17 2133)   PRN Meds:.sodium chloride, sodium chloride, sodium chloride, acetaminophen, alteplase, diphenhydrAMINE, heparin, heparin,  HYDROmorphone (DILAUDID) injection, hydrOXYzine, lidocaine (PF), lidocaine-prilocaine, oxyCODONE, pentafluoroprop-tetrafluoroeth, sodium chloride flush, sodium chloride flush   ASSESMENT:   * Microcytic anemia. Anemia multifactorial: CKD, recent cardiac cath, blood draws.  S/p PRBC x 1.  nulecit 3 x weekly for 8 doses.  feraheme on 8/28.  Aranesp weekly.    * Cirrhosis of liver diagnosed 6 weeks ago at ultrasound. Hep ABC negative.  Ferritin is not elevated.  Processing labs include mitochondrial antibody, smooth muscle antibody, ANA. Suspect cirrhosis is due to right heart failure  * AKI/CKD 4. CVVH >> HD now.   s/p IJ dialysis catheter and left arm fistula (not mature)  * Right heart failure. Volume overload. Responding to diuresis.   * Previous MVR and tricuspid valve repair.   * Chronic Coumadin for PAF. 8/23TEE-guided DCCV to NSR.  Reverted back to AF 8/24 On Heparin now.    * IDDM, type 2.   * OSA  * Thrombocytopenia.     PLAN   *  EGD and colonoscopy tomorrow.  Clears now, split dose prep.      Michael [name]  07/15/2017, 10:44 AM Pager: [phone]

## 2017-07-15 NOTE — Consult Note (Signed)
   [facility] CM Inpatient Consult   07/15/2017  Michael [name] 1961/11/05 [id number]    [facility] Care Management follow up.  [facility] Care Management referral made for [place] LCSW for follow up on transportation needs upon hospital discharge.   [name] application has been initiated by inpatient LCSW.   [name], MSN-Ed, RN,BSN [facility] Liaison [phone]

## 2017-07-15 NOTE — Progress Notes (Signed)
Pt continually requests oral fluids despite RN education regarding his renal diet. Will continue to monitor.

## 2017-07-15 NOTE — Plan of Care (Signed)
Problem: Food- and Nutrition-Related Knowledge Deficit (NB-1.1) Goal: Nutrition education Formal process to instruct or train a patient/client in a skill or to impart knowledge to help patients/clients voluntarily manage or modify food choices and eating behavior to maintain or improve health. Outcome: Completed/Met Date Met: 07/15/17  RD consulted for renal diet education.   Spoke with RN. Pt understands English, however, does not speak it very well.  He also cannot read [place]. Has a 56 year old daughter who is fluent in [place].  Provided Food Pyramid for Healthy Eating with Kidney Disease. Reviewed food groups and provided written recommended serving sizes specifically determined for patient's current nutritional status.   Explained why diet restrictions are needed and provided lists of foods to limit/avoid that are high potassium, sodium, and phosphorus. Provided specific recommendations on safer alternatives of these foods.   Discussed importance of protein intake at each meal and renal-friendly beverage options.  Expect good compliance.  Body mass index is 28.98 kg/m. Pt meets criteria for Overweight based on current BMI.  Current diet order is Clear Liquids. Labs and medications reviewed. CBG's [phone].  If additional nutrition issues arise, please re-consult RD.  Arthur Holms, RD, LDN Pager #: [phone] After-Hours Pager #: [phone]

## 2017-07-16 ENCOUNTER — Other Ambulatory Visit: Payer: Self-pay | Admitting: *Deleted

## 2017-07-16 ENCOUNTER — Inpatient Hospital Stay (HOSPITAL_COMMUNITY): Payer: Medicare Other | Admitting: Certified Registered Nurse Anesthetist

## 2017-07-16 ENCOUNTER — Encounter (HOSPITAL_COMMUNITY): Payer: Self-pay | Admitting: *Deleted

## 2017-07-16 ENCOUNTER — Encounter (HOSPITAL_COMMUNITY)
Admission: EM | Disposition: A | Discharge status: Home Health | Payer: Self-pay | Source: Home / Self Care | Attending: Cardiology

## 2017-07-16 ENCOUNTER — Telehealth: Payer: Self-pay | Admitting: Vascular Surgery

## 2017-07-16 DIAGNOSIS — D122 Benign neoplasm of ascending colon: Secondary | ICD-10-CM

## 2017-07-16 DIAGNOSIS — K648 Other hemorrhoids: Secondary | ICD-10-CM

## 2017-07-16 HISTORY — PX: COLONOSCOPY: SHX5424

## 2017-07-16 HISTORY — PX: ESOPHAGOGASTRODUODENOSCOPY: SHX5428

## 2017-07-16 LAB — HEPARIN LEVEL (UNFRACTIONATED)

## 2017-07-16 LAB — GLUCOSE, CAPILLARY
GLUCOSE-CAPILLARY: 61 mg/dL — AB (ref 65–99)
GLUCOSE-CAPILLARY: 79 mg/dL (ref 65–99)
GLUCOSE-CAPILLARY: 102 mg/dL — AB (ref 65–99)
Glucose-Capillary: 70 mg/dL (ref 65–99)
Glucose-Capillary: 89 mg/dL (ref 65–99)
Glucose-Capillary: 129 mg/dL — ABNORMAL HIGH (ref 65–99)

## 2017-07-16 LAB — RENAL FUNCTION PANEL
Albumin: 2.9 g/dL — ABNORMAL LOW (ref 3.5–5.0)
Anion gap: 11 (ref 5–15)
BUN: 28 mg/dL — ABNORMAL HIGH (ref 6–20)
CHLORIDE: 94 mmol/L — AB (ref 101–111)
CO2: 26 mmol/L (ref 22–32)
CREATININE: 2.6 mg/dL — AB (ref 0.61–1.24)
Calcium: 8.8 mg/dL — ABNORMAL LOW (ref 8.9–10.3)
GFR, EST AFRICAN AMERICAN: 30 mL/min — AB (ref >60)
GFR, EST NON AFRICAN AMERICAN: 26 mL/min — AB (ref >60)
Glucose, Bld: 82 mg/dL (ref 65–99)
POTASSIUM: 4.3 mmol/L (ref 3.5–5.1)
Phosphorus: 5 mg/dL — ABNORMAL HIGH (ref 2.5–4.6)
Sodium: 131 mmol/L — ABNORMAL LOW (ref 135–145)

## 2017-07-16 LAB — HEMOGLOBIN AND HEMATOCRIT, BLOOD
HCT: 28 % — ABNORMAL LOW (ref 39.0–52.0)
Hemoglobin: 9.6 g/dL — ABNORMAL LOW (ref 13.0–17.0)

## 2017-07-16 LAB — CBC
HEMATOCRIT: 20.5 % — AB (ref 39.0–52.0)
HEMOGLOBIN: 6.6 g/dL — AB (ref 13.0–17.0)
MCH: 26 pg (ref 26.0–34.0)
MCHC: 32.2 g/dL (ref 30.0–36.0)
MCV: 80.7 fL (ref 78.0–100.0)
Platelets: 129 10*3/uL — ABNORMAL LOW (ref 150–400)
RBC: 2.54 MIL/uL — ABNORMAL LOW (ref 4.22–5.81)
RDW: 21.7 % — ABNORMAL HIGH (ref 11.5–15.5)
WBC: 8.6 10*3/uL (ref 4.0–10.5)

## 2017-07-16 LAB — PROTIME-INR
INR: 1.25
Prothrombin Time: 15.6 seconds — ABNORMAL HIGH (ref 11.4–15.2)

## 2017-07-16 LAB — PREPARE RBC (CROSSMATCH)

## 2017-07-16 SURGERY — COLONOSCOPY
Anesthesia: Monitor Anesthesia Care

## 2017-07-16 MED ORDER — SODIUM CHLORIDE 0.9 % IV SOLN: Freq: Once | INTRAVENOUS | Status: AC

## 2017-07-16 MED ORDER — WARFARIN - PHARMACIST DOSING INPATIENT
Freq: Every day | Status: DC
  Administered 2017-07-16 – 2017-07-26 (×7)

## 2017-07-16 MED ORDER — METOPROLOL SUCCINATE ER 25 MG PO TB24
25.0000 mg | ORAL_TABLET | Freq: Every day | ORAL | Status: DC
  Administered 2017-07-17: 25 mg via ORAL
  Filled 2017-07-16: qty 1

## 2017-07-16 MED ORDER — DEXTROSE 50 % IV SOLN
INTRAVENOUS | Status: DC | PRN
  Administered 2017-07-16: 25 mL via INTRAVENOUS

## 2017-07-16 MED ORDER — DEXTROSE 50 % IV SOLN
25.0000 mL | Freq: Once | INTRAVENOUS | Status: AC
  Administered 2017-07-16: 25 mL via INTRAVENOUS

## 2017-07-16 MED ORDER — AMIODARONE HCL 200 MG PO TABS
200.0000 mg | ORAL_TABLET | Freq: Two times a day (BID) | ORAL | Status: DC
  Administered 2017-07-16 – 2017-07-17 (×3): 200 mg via ORAL
  Filled 2017-07-16 (×3): qty 1

## 2017-07-16 MED ORDER — PROPOFOL 500 MG/50ML IV EMUL
INTRAVENOUS | Status: DC | PRN
  Administered 2017-07-16: 50 ug/kg/min via INTRAVENOUS

## 2017-07-16 MED ORDER — HEPARIN (PORCINE) IN NACL 100-0.45 UNIT/ML-% IJ SOLN
1500.0000 [IU]/h | INTRAMUSCULAR | Status: DC
  Administered 2017-07-16: 1400 [IU]/h via INTRAVENOUS
  Administered 2017-07-17: 1500 [IU]/h via INTRAVENOUS
  Filled 2017-07-16: qty 250

## 2017-07-16 MED ORDER — WARFARIN SODIUM 2.5 MG PO TABS
2.5000 mg | ORAL_TABLET | Freq: Once | ORAL | Status: AC
  Administered 2017-07-16: 2.5 mg via ORAL
  Filled 2017-07-16: qty 1

## 2017-07-16 MED ORDER — DEXTROSE 50 % IV SOLN
INTRAVENOUS | Status: AC
  Filled 2017-07-16: qty 50

## 2017-07-16 MED ORDER — PROPOFOL 10 MG/ML IV BOLUS
INTRAVENOUS | Status: DC | PRN
  Administered 2017-07-16: 10 mg via INTRAVENOUS

## 2017-07-16 NOTE — Anesthesia Preprocedure Evaluation (Signed)
Anesthesia Evaluation  Patient identified by MRN, date of birth, ID band Patient awake    Reviewed: Allergy & Precautions, NPO status , Patient's Chart, lab work & pertinent test results  Airway Mallampati: II  TM Distance: >3 FB Neck ROM: Full    Dental no notable dental hx.    Pulmonary sleep apnea , Current Smoker,    Pulmonary exam normal breath sounds clear to auscultation       Cardiovascular hypertension, +CHF  Normal cardiovascular exam+ dysrhythmias Atrial Fibrillation  Rhythm:Regular Rate:Normal  S/p MVR   Neuro/Psych negative neurological ROS  negative psych ROS   GI/Hepatic negative GI ROS, Neg liver ROS,   Endo/Other  diabetes, Type 2  Renal/GU ESRF and DialysisRenal disease  negative genitourinary   Musculoskeletal negative musculoskeletal ROS (+)   Abdominal   Peds negative pediatric ROS (+)  Hematology negative hematology ROS (+)   Anesthesia Other Findings a. prior EF normal; b. Echo 10/16: Mild LVH, EF 30-35%, mitral valve bioprosthesis present without evidence of stenosis or regurgitation, severe LAE, mild RVE with mild to moderately reduced RVSF, moderate RAE  Reproductive/Obstetrics negative OB ROS                            Anesthesia Physical  Anesthesia Plan  ASA: IV  Anesthesia Plan: MAC   Post-op Pain Management:    Induction: Intravenous  PONV Risk Score and Plan: 1 and Ondansetron  Airway Management Planned: Nasal Cannula and Natural Airway  Additional Equipment:   Intra-op Plan:   Post-operative Plan:   Informed Consent: I have reviewed the patients History and Physical, chart, labs and discussed the procedure including the risks, benefits and alternatives for the proposed anesthesia with the patient or authorized representative who has indicated his/her understanding and acceptance.   Dental advisory given  Plan Discussed with:  CRNA  Anesthesia Plan Comments:        Anesthesia Quick Evaluation

## 2017-07-16 NOTE — Op Note (Signed)
[facility] Patient Name: Michael [name] Procedure Date : 07/16/2017 MRN: [id number] Attending MD: Jerene Bears , MD Date of Birth: 1961/01/09 CSN: [id number] Age: 56 Admit Type: Inpatient Procedure:                Upper GI endoscopy Indications:              Heme positive stool, Suspectedgastrointestinal                            bleeding with rectal bleeding, Cirrhosis rule out                            esophageal varices Providers:                Lajuan Lines. Hilarie Fredrickson, MD, Kingsley Plan, RN, Corliss Parish, Technician Referring MD:             Larey Dresser, MD Medicines:                Monitored Anesthesia Care Complications:            No immediate complications. Estimated Blood Loss:     Estimated blood loss: none. Procedure:                Pre-Anesthesia Assessment:                           - Prior to the procedure, a History and Physical                            was performed, and patient medications and                            allergies were reviewed. The patient's tolerance of                            previous anesthesia was also reviewed. The risks                            and benefits of the procedure and the sedation                            options and risks were discussed with the patient.                            All questions were answered, and informed consent                            was obtained. Prior Anticoagulants: The patient has                            taken Coumadin (warfarin), last dose was 5 days  prior to procedure. ASA Grade Assessment: III - A                            patient with severe systemic disease. After                            reviewing the risks and benefits, the patient was                            deemed in satisfactory condition to undergo the                            procedure.                           After obtaining informed consent, the  endoscope was                            passed under direct vision. Throughout the                            procedure, the patient's blood pressure, pulse, and                            oxygen saturations were monitored continuously. The                            [id number] ([id number]) scope was introduced through the                            mouth, and advanced to the second part of duodenum.                            The upper GI endoscopy was accomplished without                            difficulty. The patient tolerated the procedure                            well. Scope In: Scope Out: Findings:      The esophagus was normal.      The stomach was normal.      The examined duodenum was normal.      There is no endoscopic evidence of varices in the entire esophagus.      There is no endoscopic evidence of varices in the gastroesophageal       junction, in the cardia and in the gastric fundus. Impression:               - Normal esophagus.                           - Normal stomach.                           - Normal examined duodenum.                           -  No evidence for gastroesophageal varices.                           - No specimens collected. Moderate Sedation:      N/A Recommendation:           - Return patient to hospital ward for ongoing care.                           - Resume previous diet.                           - Continue present medications.                           - See the other procedure note for documentation of                            additional recommendations. Procedure Code(s):        --- Professional ---                           [phone], Esophagogastroduodenoscopy, flexible,                            transoral; diagnostic, including collection of                            specimen(s) by brushing or washing, when performed                            (separate procedure) Diagnosis Code(s):        --- Professional ---                            R19.5, Other fecal abnormalities                           R58, Hemorrhage, not elsewhere classified                           K74.60, Unspecified cirrhosis of liver CPT copyright 2016 [employer]. All rights reserved. The codes documented in this report are preliminary and upon coder review may  be revised to meet current compliance requirements. Jerene Bears, MD 07/16/2017 11:59:38 AM This report has been signed electronically. Number of Addenda: 0

## 2017-07-16 NOTE — Transfer of Care (Signed)
Immediate Anesthesia Transfer of Care Note  Patient: Michael [name]  Procedure(s) Performed: Procedure(s): COLONOSCOPY (N/A) ESOPHAGOGASTRODUODENOSCOPY (EGD) (N/A)  Patient Location: Endoscopy Unit  Anesthesia Type:MAC  Level of Consciousness: drowsy  Airway & Oxygen Therapy: Patient Spontanous Breathing and Patient connected to nasal cannula oxygen  Post-op Assessment: Report given to RN and Post -op Vital signs reviewed and stable  Post vital signs: Reviewed and stable  Last Vitals:  Vitals:   07/16/17 1135 07/16/17 1222  BP: 99/86 112/60  Pulse:    Resp: 13 12  Temp: 36.7 C 36.6 C  SpO2: 96% 100%    Last Pain:  Vitals:   07/16/17 1222  TempSrc: Axillary  PainSc:       Patients Stated Pain Goal: 0 (38/18/29 9371)  Complications: No apparent anesthesia complications

## 2017-07-16 NOTE — Progress Notes (Addendum)
CRITICAL VALUE ALERT  Critical Value:  Hemoglobin 6.6  Date & Time Notied:  07/16/17 0937  Provider Notified: will page Dr. Aundra Dubin   Orders Received/Actions taken: awaiting orders.    [id number]: Jonni Sanger, [place] returned page. Order placed for T&S and to transfuse 2 units PRBCs. Will await call from Blood bank. Also, patient scheduled for endo/colonoscopy procedures at 10:45 today. Will continue to monitor. - Soyla Dryer, RN

## 2017-07-16 NOTE — H&P (View-Only) (Signed)
Daily Rounding Note  07/15/2017, 10:44 AM  LOS: 18 days   SUBJECTIVE:   Chief complaint:  Pain in low back and knee pain.    Received notification that Dr. Benjamine Mola feels that the patient is stable for colonoscopy/upper endoscopy tomorrow. Currently in hemodialysis where he was seen.  Has had some dialysis associated hypotension.  OBJECTIVE:         Vital signs in last 24 hours:    Temp:  [97.4 F (36.3 C)-98.8 F (37.1 C)] 97.9 F (36.6 C) (08/29 0738) Pulse Rate:  [80-131] 124 (08/29 1000) Resp:  [14-20] 18 (08/29 1000) BP: (78-110)/(45-82) 85/56 (08/29 1000) SpO2:  [97 %-99 %] 99 % (08/29 0930) Weight:  [77.4 kg (170 lb 9.6 oz)-80 kg (176 lb 5.9 oz)] 80 kg (176 lb 5.9 oz) (08/29 0738) Last BM Date: 07/14/17 Filed Weights   07/13/17 1245 07/15/17 0503 07/15/17 0738  Weight: 78.6 kg (173 lb 4.8 oz) 77.4 kg (170 lb 9.6 oz) 80 kg (176 lb 5.9 oz)   General: anxious, uncomfortable and restless   Heart: RRR Chest: clear bil Abdomen: soft, NT, ND.  Hypoactive BS  Extremities: bruising in left arm.  No pedal edema Neuro/Psych:  Anxious, oriented x 3.  Moving all 4s.    Intake/Output from previous day: 08/28 0701 - 08/29 0700 In: 1772.9 [P.O.:1720; I.V.:52.9] Out: 175 [Urine:175]  Intake/Output this shift: No intake/output data recorded.  Lab Results:  Recent Labs  07/13/17 2121 07/14/17 0506 07/15/17 0950  WBC 8.2 11.6* 13.0*  HGB 7.9* 8.0* 7.0*  HCT 23.8* 24.8* 21.5*  PLT 144* 129* 137*   BMET  Recent Labs  07/13/17 0605 07/15/17 0948  NA 129* 130*  K 5.0 5.0  CL 95* 94*  CO2 23 25  GLUCOSE 95 134*  BUN 61* 55*  CREATININE 3.44* 3.62*  CALCIUM 9.0 8.8*   LFT  Recent Labs  07/13/17 0605 07/15/17 0948  ALBUMIN 3.0* 3.0*   PT/INR  Recent Labs  07/13/17 0605 07/14/17 0506  LABPROT 17.4* 15.3*  INR 1.41 1.21   Hepatitis Panel No results for input(s): HEPBSAG, HCVAB, HEPAIGM,  HEPBIGM in the last 72 hours.  Studies/Results: Dg Chest Port 1 View  Result Date: 07/13/2017 CLINICAL DATA:  Dialysis catheter placement EXAM: PORTABLE CHEST 1 VIEW COMPARISON:  06/30/2017 FINDINGS: Right internal jugular dialysis catheter remains in place. Interval placement of tunneled dialysis catheter on the right with the tip in the SVC. No pneumothorax. Prior CABG. Heart is enlarged. Improving aeration in the right lung base. Mild residual bibasilar atelectasis. No edema or effusions. IMPRESSION: Interval placement of right internal jugular tunnel dialysis catheter with the tip in the SVC. No pneumothorax. Cardiomegaly, bibasilar atelectasis. Electronically Signed   By: Rolm Baptise M.D.   On: 07/13/2017 11:16   Scheduled Meds: . amiodarone  400 mg Oral BID  . atorvastatin  20 mg Oral Daily  . calcium acetate  667 mg Oral TID WC  . darbepoetin (ARANESP) injection - NON-DIALYSIS  200 mcg Subcutaneous Q Sat-1800  . docusate sodium  100 mg Oral BID  . feeding supplement (NEPRO CARB STEADY)  237 mL Oral q morning - 10a  . heparin  40 Units/kg Dialysis Once in dialysis  . insulin aspart  0-15 Units Subcutaneous TID WC  . insulin aspart  4 Units Subcutaneous TID WC  . insulin detemir  8 Units Subcutaneous Q2200  . metoprolol succinate  12.5 mg Oral Daily  .  pantoprazole  40 mg Oral Daily  . polyethylene glycol  17 g Oral Daily  . predniSONE  20 mg Oral Q breakfast  . sodium chloride  2 spray Each Nare BID  . sodium chloride flush  10-40 mL Intracatheter Q12H  . sodium chloride flush  3 mL Intravenous Q12H   Continuous Infusions: . sodium chloride 0  (07/01/17 1500)  . sodium chloride Stopped (07/06/17 0814)  . sodium chloride    . sodium chloride    . sodium chloride    . ferric gluconate (FERRLECIT/NULECIT) IV    . heparin 1,400 Units/hr (07/14/17 2133)   PRN Meds:.sodium chloride, sodium chloride, sodium chloride, acetaminophen, alteplase, diphenhydrAMINE, heparin, heparin,  HYDROmorphone (DILAUDID) injection, hydrOXYzine, lidocaine (PF), lidocaine-prilocaine, oxyCODONE, pentafluoroprop-tetrafluoroeth, sodium chloride flush, sodium chloride flush   ASSESMENT:   * Microcytic anemia. Anemia multifactorial: CKD, recent cardiac cath, blood draws.  S/p PRBC x 1.  nulecit 3 x weekly for 8 doses.  feraheme on 8/28.  Aranesp weekly.    * Cirrhosis of liver diagnosed 6 weeks ago at ultrasound. Hep ABC negative.  Ferritin is not elevated.  Processing labs include mitochondrial antibody, smooth muscle antibody, ANA. Suspect cirrhosis is due to right heart failure  * AKI/CKD 4. CVVH >> HD now.   s/p IJ dialysis catheter and left arm fistula (not mature)  * Right heart failure. Volume overload. Responding to diuresis.   * Previous MVR and tricuspid valve repair.   * Chronic Coumadin for PAF. 8/23TEE-guided DCCV to NSR.  Reverted back to AF 8/24 On Heparin now.    * IDDM, type 2.   * OSA  * Thrombocytopenia.     PLAN   *  EGD and colonoscopy tomorrow.  Clears now, split dose prep.      Azucena Freed  07/15/2017, 10:44 AM Pager: [phone]

## 2017-07-16 NOTE — Progress Notes (Signed)
Patient ID: Michael [name], male   DOB: 1961-05-04, 56 y.o.   MRN: [id number]      Advanced Heart Failure Rounding Note  Primary Cardiologist: Dr. Harrington Challenger HF Cardiology: Aundra Dubin  Subjective:    Started CVVHD 8/15 and stopped milrinone. CVVH stopped 8/21. Currently getting iHD.  Tolerating so far though BP drops.   On 8/23, he had TEE-guided DCCV to NSR.  Reverted back to AF 8/24  He had CT abdomen/pelvis 8/27: no retroperitoneal hematoma or other cause of fall in hemoglobin or back pain.   GI consulted for anemia, BRBPR. Plan for EGD/Colonoscopy today.   Complaining of back pain, no change. Denies SOB.    Studies: Echo (7/18): EF 50-55%, mild LVH, mild AI, bioprosthetic mitral valve appeared to function normally, RV moderately dilated with moderately decreased systolic function, D-shaped interventricular septum suggestive of RV pressure/volume overload, severe TR, PASP 54 mmHg  [facility] Procedural Findings: Hemodynamics (mmHg) RA mean 27 RV 63/8, mean 20 PA 58/28, mean 40 PCWP mean 28 Oxygen saturations: PA 69% AO 96% Cardiac Output (Fick) 9.82  Cardiac Index (Fick) 4.96 PVR 1.2 WU  TEE (8/23): Normal LV size and systolic function, EF 96-22%.  Normal wall thickness and motion.  D-shaped interventricular septum suggestive of RV pressure/volume overload.  The RV was severely dilated with moderately decreased systolic function.  Severe LAE, no thrombus seen.  The LA appendage appears to have been ligated.  Moderate right atrial enlargement.  No PFO/ASD by color doppler.  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  Peak RV-RA gradient 25 mmHg.  Status post mitral valve replacement with bioprosthetic valve.  Trivial MR.  Mean gradient 6 mmHg across valve, does not appear to have significant stenosis.  Trileaflet aortic valve with moderate aortic regurgitation, no significant stenosis.  Normal caliber aorta with grade III plaque in descending thoracic aorta.   Objective:     Weight Range: 176 lb (79.8 kg) Body mass index is 29.29 kg/m.   Vital Signs:   Temp:  [97.7 F (36.5 C)-98.6 F (37 C)] 98.5 F (36.9 C) (08/30 0254) Pulse Rate:  [77-124] 93 (08/30 0254) Resp:  [17-20] 20 (08/30 0254) BP: (78-109)/(45-74) 109/70 (08/30 0254) SpO2:  [94 %-100 %] 99 % (08/30 0254) Weight:  [174 lb 2.6 oz (79 kg)-176 lb (79.8 kg)] 176 lb (79.8 kg) (08/30 0249) Last BM Date: 07/16/17  Weight change: Filed Weights   07/15/17 0738 07/15/17 1138 07/16/17 0249  Weight: 176 lb 5.9 oz (80 kg) 174 lb 2.6 oz (79 kg) 176 lb (79.8 kg)    Intake/Output:   Intake/Output Summary (Last 24 hours) at 07/16/17 0829 Last data filed at 07/16/17 0749  Gross per 24 hour  Intake          1816.22 ml  Output             1000 ml  Net           816.22 ml      Physical Exam  General:  NAD. In bed.  HEENT: normal Neck: supple. JVP 7-8.  Carotids 2+ bilat; no bruits. No lymphadenopathy or thryomegaly appreciated. Cor: PMI nondisplaced. Irregular rate & rhythm. No rubs, gallops or murmurs. R upper chest HD cath Lungs: clear Abdomen: soft, nontender, nondistended. No hepatosplenomegaly. No bruits or masses. Good bowel sounds. Extremities: no cyanosis, clubbing, rash, edema Neuro: alert & orientedx3, cranial nerves grossly intact. moves all 4 extremities w/o difficulty. Affect pleasant    Telemetry   Personally reviewed. A  fib 90s.   Labs    CBC  Recent Labs  07/14/17 0506 07/15/17 0950  WBC 11.6* 13.0*  HGB 8.0* 7.0*  HCT 24.8* 21.5*  MCV 79.7 79.9  PLT 129* 161*   Basic Metabolic Panel  Recent Labs  07/15/17 0948  NA 130*  K 5.0  CL 94*  CO2 25  GLUCOSE 134*  BUN 55*  CREATININE 3.62*  CALCIUM 8.8*  PHOS 5.1*   Liver Function Tests  Recent Labs  07/15/17 0948  ALBUMIN 3.0*   No results for input(s): LIPASE, AMYLASE in the last 72 hours. Cardiac Enzymes No results for input(s): CKTOTAL, CKMB, CKMBINDEX, TROPONINI in the last 72  hours.  BNP: BNP (last 3 results)  Recent Labs  10/05/16 0147 05/30/17 1208 06/25/17 1253  BNP 237.4* 1,404.9* 875.7*    ProBNP (last 3 results)  Recent Labs  03/16/17 1401  PROBNP 6,530*   Imaging   Transthoracic Echocardiography 06/01/17  Study Conclusions  - Left ventricle: The cavity size was normal. There was mild   concentric hypertrophy. Systolic function was normal. The   estimated ejection fraction was in the range of 50% to 55%. Wall   motion was normal; there were no regional wall motion   abnormalities. - Ventricular septum: The contour showed diastolic flattening. - Aortic valve: Transvalvular velocity was minimally increased.   There was no stenosis. There was mild regurgitation. Peak   velocity (S): 201 cm/s. - Mitral valve: A bioprosthesis was present and functioning   normally. Mean gradient (D): 6 mm Hg. Valve area by pressure   half-time: 1.93 cm^2. - Left atrium: The atrium was severely dilated. - Right ventricle: The cavity size was moderately dilated. Wall   thickness was normal. Systolic function was moderately reduced. - Right atrium: The atrium was moderately dilated. - Tricuspid valve: There was severe regurgitation. - Pulmonary arteries: Systolic pressure was moderately increased.   PA peak pressure: 54 mm Hg (S).   RIGHT HEART CATH 06/29/17 RA mean 27 RV 63/8, mean 20 PA 58/28, mean 40 PCWP mean 28  Oxygen saturations: PA 69% AO 96%  Cardiac Output (Fick) 9.82  Cardiac Index (Fick) 4.96 PVR 1.2 WU     Medications:     Scheduled Medications: . amiodarone  400 mg Oral BID  . atorvastatin  20 mg Oral Daily  . calcium acetate  667 mg Oral TID WC  . darbepoetin (ARANESP) injection - NON-DIALYSIS  200 mcg Subcutaneous Q Sat-1800  . docusate sodium  100 mg Oral BID  . [START ON 07/17/2017] feeding supplement (NEPRO CARB STEADY)  237 mL Oral q morning - 10a  . heparin  40 Units/kg Dialysis Once in dialysis  . insulin aspart   0-15 Units Subcutaneous TID WC  . insulin aspart  4 Units Subcutaneous TID WC  . insulin detemir  8 Units Subcutaneous Q2200  . metoCLOPramide (REGLAN) injection  10 mg Intravenous Once  . metoprolol succinate  12.5 mg Oral Daily  . pantoprazole  40 mg Oral Daily  . polyethylene glycol  17 g Oral Daily  . predniSONE  20 mg Oral Q breakfast  . sodium chloride  2 spray Each Nare BID  . sodium chloride flush  3 mL Intravenous Q12H    Infusions: . sodium chloride 0  (07/01/17 1500)  . sodium chloride Stopped (07/06/17 0814)  . sodium chloride    . 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 Patient underwent mitral valve replacement and tricuspid valve repair in [place] in 2012.    Assessment/Plan   1. Acute on chronic diastolic CHF/RV failure: EF 50-55% on echo in 7/18 with prominent RV failure (enlarged/hypokinetic RV with D-shaped interventricular septum), severe TR.  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  Milrinone was started at admission for RV support, stopped now that he is getting RRT.  CVVH stopped 8/21 and has been getting regular iHD since then.   - Volume well controlled on HD. Appreciate renal care.  - Coreg stopped with low BP in HD.   2. AKI  on CKD stage IV: Now getting RRT and off milrinone.  He is going to require long-term HD, significant RV failure will complicate this but he has done well with it so far.  He lives alone and doesn't have much help with his healthcare so chance at heart-kidney transplant would be very low.  - [facility] has seen. He had tunneled catheter and AV fistula 07/13/17.  - Tolerating HD so far though BP does drop.  3. Atrial fibrillation: Persistent.  Has been in atrial fibrillation for a number of months. S/p TEE-guided DCCV on 8/23.  Back in afib on 8/24 and has stayed in afib.  - Would not repeat DCCV.   - Continue Toprol XL 12.5 mg daily, continue amiodarone for now for rate control. HR in the 90s   Meds limited by hypotension.  - remains on heparin. Coumadin on hold for EGD/colonoscopy today => restart afterwards.     4. Anemia: He has had BRBPR but not in the last few days.  Transfusion this admission. GI appreciated. Plan for EGD/Colonoscopy today.  -  Started on ESA per renal 5. Bioprosthetic mitral valve: Stable appearance on 7/18 echo.  No change. 6. Probable sleep apnea: Will need sleep study outpatient. No change.  7. Acute gout in R foot: Prednisone 8/25 x 3 days. Improved.  8. Low back pain: Ongoing.  CT did not show RP hematoma or other cause for back pain. Colonoscopy today.  9. Disposition: Home soon, needs GI workup today and will need outpatient HD set up.   Labs pending.  Length of Stay: [place], NP  07/16/2017, 8:29 AM  Advanced Heart Failure Team Pager [phone] (M-F; [place])  Please contact [facility] for night-coverage after hours (4p -7a ) and weekends on [email or web address]  Patient seen with NP, agree with the above note.   GI workup with colonoscopy/EGD planned for today.   HR better-controlled in atrial fibrillation.  Would increase Toprol XL to 25 mg daily and decrease amiodarone to 200 mg bid.  We will not plan repeat DCCV attempt.  Restart warfarin after endoscopies today.    Ongoing HD.  Needs plan for outpatient HD.    Ongoing low back pain, suspect MSK (imaging negative).   Home after GI workup and restarting warfarin.   [name] 07/16/2017 8:53 AM

## 2017-07-16 NOTE — Progress Notes (Signed)
Hypoglycemic Event  CBG: 61  Treatment: D50 IV 25 mL  Symptoms: None; denies  Follow-up CBG: Time:1127 CBG Result:79  Possible Reasons for Event: Inadequate meal intake  Comments/MD notified:Dr. Pyrtle and Dr. Ermalene Postin prior to EGD colonoscopy  Verbal order received from Dr. Ermalene Postin to give additional 8mL of D50 after recheck 79. Will recheck CBG in endo recovery.    Jake Goodson L Tag [place]

## 2017-07-16 NOTE — Progress Notes (Addendum)
Physical Therapy Treatment Patient Details Name: Michael [name] MRN: [id number] DOB: 1961/08/08 Today's Date: 07/16/2017    History of Present Illness HPI: Hassaan Whichard is a 56 y.o. male with medical history significant of anemia of chronic disease, chronic kidney disease, systolic heart failure, diabetes mellitus, GERD, history of tricuspid valve repair, essential hypertension, atrial fibrillation, pulmonary hypertension. Patient presented from his PCPs office for evaluation of anasarca. Acute kidney injury as well with cardiorenal syndrome. Pt currently on CRRT (began 8/14).     PT Comments    Patient tolerated increased mobility compared to yesterday's session however continues to be limited by low back/pelvic pain. Pt ambulated 142ft with min guard/min A and RW and attempted bilat LE therex. Continue to progress as tolerated.    Follow Up Recommendations  Home health PT     Equipment Recommendations  Rolling walker with 5" wheels    Recommendations for Other Services       Precautions / Restrictions Precautions Precautions: Fall Restrictions Weight Bearing Restrictions: No    Mobility  Bed Mobility Overal bed mobility: Needs Assistance Bed Mobility: Supine to Sit;Sit to Supine     Supine to sit: Modified independent (Device/Increase time) Sit to supine: Min assist   General bed mobility comments: assist needed to bring bilat LE into bed  Transfers Overall transfer level: Needs assistance Equipment used: Rolling walker (2 wheeled) Transfers: Sit to/from Stand Sit to Stand: Min guard         General transfer comment: min guard for safety; cues for safe hand placement  Ambulation/Gait Ambulation/Gait assistance: Min assist Ambulation Distance (Feet): 120 Feet Assistive device: Rolling walker (2 wheeled) Gait Pattern/deviations: Step-through pattern;Decreased stride length;Trunk flexed Gait velocity: decreased    General Gait Details: cues for  posture and safe use of AD; limited by incresing low back/pelvic pain and began leaning on RW   Stairs            Wheelchair Mobility    Modified Rankin (Stroke Patients Only)       Balance Overall balance assessment: Needs assistance Sitting-balance support: No upper extremity supported;Feet supported Sitting balance-Leahy Scale: Good     Standing balance support: During functional activity;Bilateral upper extremity supported Standing balance-Leahy Scale: Poor                              Cognition Arousal/Alertness: Awake/alert Behavior During Therapy: WFL for tasks assessed/performed Overall Cognitive Status: Within Functional Limits for tasks assessed                                 General Comments: drowsy--recently received Dilaudid      Exercises General Exercises - Lower Extremity Heel Slides: Other (comment) (completed 2 reps each LE; limited by pain in extension) Hip Flexion/Marching: AROM;Both;10 reps;Seated Toe Raises: AROM;Both;20 reps Heel Raises: AROM;Both;20 reps    General Comments        Pertinent Vitals/Pain Pain Assessment: Faces Faces Pain Scale: Hurts even more Pain Location: low back/pelvis Pain Descriptors / Indicators: Grimacing;Guarding;Sharp Pain Intervention(s): Limited activity within patient's tolerance;Monitored during session;Premedicated before session;Repositioned    Home Living                      Prior Function            PT Goals (current goals can now be found in the care plan section) Acute  Rehab PT Goals PT Goal Formulation: With patient Time For Goal Achievement: 07/16/17 Potential to Achieve Goals: Good Progress towards PT goals: Progressing toward goals    Frequency    Min 3X/week      PT Plan Current plan remains appropriate    Co-evaluation              AM-PAC PT "6 Clicks" Daily Activity  Outcome Measure  Difficulty turning over in bed (including  adjusting bedclothes, sheets and blankets)?: None Difficulty moving from lying on back to sitting on the side of the bed? : None Difficulty sitting down on and standing up from a chair with arms (e.g., wheelchair, bedside commode, etc,.)?: Unable Help needed moving to and from a bed to chair (including a wheelchair)?: A Little Help needed walking in hospital room?: A Little Help needed climbing 3-5 steps with a railing? : A Lot 6 Click Score: 17    End of Session Equipment Utilized During Treatment: Gait belt Activity Tolerance: Patient limited by pain Patient left: with call bell/phone within reach;in bed Nurse Communication: Mobility status PT Visit Diagnosis: Unsteadiness on feet (R26.81);Muscle weakness (generalized) (M62.81)     Time: 4287-6811 PT Time Calculation (min) (ACUTE ONLY): 20 min  Charges:  $Gait Training: 8-22 mins                    G Codes:       Earney Navy, PTA Pager: [phone]     Darliss Cheney 07/16/2017, 9:54 AM

## 2017-07-16 NOTE — Op Note (Signed)
[facility] Patient Name: Michael [name] Procedure Date : 07/16/2017 MRN: [id number] Attending MD: Jerene Bears , MD Date of Birth: October 23, 1961 CSN: [id number] Age: 56 Admit Type: Inpatient Procedure:                Colonoscopy Indications:              Rectal bleeding, multifactorial anemia Providers:                Lajuan Lines. Hilarie Fredrickson, MD, Kingsley Plan, RN, Corliss Parish, Technician Referring MD:             Larey Dresser, MD Medicines:                Monitored Anesthesia Care Complications:            No immediate complications. Estimated Blood Loss:     Estimated blood loss was minimal. Procedure:                Pre-Anesthesia Assessment:                           - Prior to the procedure, a History and Physical                            was performed, and patient medications and                            allergies were reviewed. The patient's tolerance of                            previous anesthesia was also reviewed. The risks                            and benefits of the procedure and the sedation                            options and risks were discussed with the patient.                            All questions were answered, and informed consent                            was obtained. Prior Anticoagulants: The patient has                            taken Coumadin (warfarin), last dose was 5 days                            prior to procedure. ASA Grade Assessment: III - A                            patient with severe systemic disease. After  reviewing the risks and benefits, the patient was                            deemed in satisfactory condition to undergo the                            procedure.                           - Prior to the procedure, a History and Physical                            was performed, and patient medications and                            allergies were reviewed.  The patient's tolerance of                            previous anesthesia was also reviewed. The risks                            and benefits of the procedure and the sedation                            options and risks were discussed with the patient.                            All questions were answered, and informed consent                            was obtained. Prior Anticoagulants: The patient has                            taken Coumadin (warfarin), last dose was 5 days                            prior to procedure. ASA Grade Assessment: III - A                            patient with severe systemic disease. After                            reviewing the risks and benefits, the patient was                            deemed in satisfactory condition to undergo the                            procedure.                           After obtaining informed consent, the colonoscope  was passed under direct vision. Throughout the                            procedure, the patient's blood pressure, pulse, and                            oxygen saturations were monitored continuously. The                            EC-[id number] ([id number]) scope was introduced through                            the anus and advanced to the the cecum, identified                            by appendiceal orifice and ileocecal valve. The                            colonoscopy was performed without difficulty. The                            patient tolerated the procedure well. The quality                            of the bowel preparation was fair with light yellow                            stool with no evidence of active or recent                            bleeding. This was cleared with copious irrigation                            and lavage yielding good, but no excellent views.                            The ileocecal valve, appendiceal orifice, and                             rectum were photographed. Scope In: 12:02:02 PM Scope Out: 12:29:23 PM Scope Withdrawal Time: 0 hours 16 minutes 6 seconds  Total Procedure Duration: 0 hours 27 minutes 21 seconds  Findings:      The digital rectal exam was normal.      Two sessile polyps were found in the ascending colon. The polyps were 5       to 6 mm in size. These polyps were removed with a cold snare. Resection       and retrieval were complete. For hemostasis, one hemostatic clip was       successfully placed at the polypectomy base after removal of the larger       polyp due to persistent oozing (MR conditional). There was no apparent       oozing from the small polypectomy site. There was no bleeding at the end  of the maneuver.      Internal hemorrhoids were found during retroflexion. The hemorrhoids       were small.      The exam was otherwise without abnormality. No other polyps were seen. Impression:               - Preparation of the colon was fair, see above.                           - Two 5 to 6 mm polyps in the ascending colon,                            removed with a cold snare. Resected and retrieved.                            Clip x 1 (MR conditional) was placed for minor                            oozing after polypectomy.                           - Internal hemorrhoids (likely explanation of small                            volume rectal bleeding with BM)                           - The examination was otherwise normal. Moderate Sedation:      N/A Recommendation:           - Return patient to hospital ward for ongoing care.                           - Resume previous diet.                           - Continue present medications.                           - Await pathology results.                           - Repeat colonoscopy is recommended for                            surveillance. The colonoscopy date will be                            determined after pathology results from  today's                            exam become available for review.                           - Monitor and support Hgb as needed. Procedure Code(s):        --- Professional ---  45385, Colonoscopy, flexible; with removal of                            tumor(s), polyp(s), or other lesion(s) by snare                            technique Diagnosis Code(s):        --- Professional ---                           K64.8, Other hemorrhoids                           D12.2, Benign neoplasm of ascending colon                           K62.5, Hemorrhage of anus and rectum CPT copyright 2016 American Medical Association. All rights reserved. The codes documented in this report are preliminary and upon coder review may  be revised to meet current compliance requirements. Jerene Bears, MD 07/16/2017 12:40:38 PM This report has been signed electronically. Number of Addenda: 0

## 2017-07-16 NOTE — Telephone Encounter (Signed)
-----   Message from Mena Goes, RN sent at 07/13/2017 11:57 AM EDT ----- Regarding: 6 weeks w/ duplex   ----- Message ----- From: Ansel Bong Sent: 07/13/2017  10:46 AM To: [facility] Charge Pool  S/p left 1st stage BVT 07/13/17  F/u with Dr. Donzetta Matters in 6 weeks with duplex  Thanks Maudie Mercury

## 2017-07-16 NOTE — Patient Outreach (Signed)
[facility]) Care Management  07/16/2017  Michael [name] 28-Feb-1961 [id number]   CSW made an initial attempt to try and contact patient today to perform phone assessment, as well as assess and assist with social needs and services, without success.  A HIPAA compliant message was left for patient on voicemail.  CSW is currently awaiting a return call. CSW will make a second outreach attempt within the next week, if CSW does not receive a return call from patient in the meantime. CSW noted that patient is still hospitalized.  CSW will continue to follow along and assist patient with transportation needs upon discharge from the hospital. [name], [place], MSW, [place]  Licensed Clinical Social Worker  [place]  Mailing [place] [address], [place], [place] [address] Physical Address-[address], [place],  [address] Toll Free Main # [phone] Fax # [phone] Cell # [phone]  Office # [phone] [email or web address]

## 2017-07-16 NOTE — Progress Notes (Signed)
ANTICOAGULATION CONSULT NOTE - Follow Up Consult  Pharmacy Consult for Heparin and Warfarin Indication: atrial fibrillation  No Known Allergies  Labs:  Recent Labs  07/14/17 0506 07/14/17 1932 07/15/17 0947 07/15/17 0948 07/15/17 0950 07/16/17 0847  HGB 8.0*  --   --   --  7.0* 6.6*  HCT 24.8*  --   --   --  21.5* 20.5*  PLT 129*  --   --   --  137* 129*  LABPROT 15.3*  --  16.4*  --   --  15.6*  INR 1.21  --  1.34  --   --  1.25  HEPARINUNFRC  --  0.21* 0.43  --   --  <0.10*  CREATININE  --   --   --  3.62*  --  2.60*    Estimated Creatinine Clearance: 30.5 mL/min (A) (by C-G formula based on SCr of 2.6 mg/dL (H)).   Assessment: Pt on warfarin PTA for afib/MVR (bioprosthetic). Warfarin held pending HD access placement and he was started on a heparin bridge. S/P DCCV for Afib 8/23 but back in afib on amiodarone. Underwent fistula and TDC placement 8/27. Heparin resumed 8/28 and then turned off this morning prior to EGD and colonoscopy today. EGD unremarkable. 2 polyps were found in the ascending colon, removed, and one clip was placed for oozing. Both heparin and coumadin to resume this evening. INR 1.2. Heparin previously therapeutic on 1400 units/hr. Will aim for lower goal in setting of anemia.   Hgb dropped to 6.6 today - 2 units PRBCs ordered.  PTA warfarin dose was 2.5 mg daily  Goal of Therapy:  INR 2-3 Heparin level 0.3-0.5 Monitor platelets by anticoagulation protocol: Yes   Plan:  1) Resume heparin at 1400 units/hr at 1800 2) Check 8 hour heparin level 3) Warfarin 2.5mg  tonight 4) Daily heparin level, INR, CBC  Nena Jordan, PharmD, BCPS 07/16/2017 3:11 PM

## 2017-07-16 NOTE — Telephone Encounter (Signed)
Sched appt 08/28/17; lab at 1:00 and MD at 1:45. Spoke to pt.

## 2017-07-16 NOTE — Progress Notes (Signed)
Patient ID: Michael [name], male   DOB: Apr 05, 1961, 56 y.o.   MRN: [id number]  [facility] Progress Note   Assessment/ Plan:   1. Acute on chronic diastolic heart failure (EF 50-55%) with long-standing mitral valve disease. Continue hemodialysis for ultrafiltration/volume management 2. ESRD with evidence of chronic cardiorenal syndrome exacerbated by recent CHF exacerbation, continue maintenance hemodialysis at this time with ultrafiltration. Continue hemodialysis on a Monday/Wednesday/Friday schedule-he has been accepted to [facility] where he will go to after discharge. Ordered for next hemodialysis treatment tomorrow 3. Anemia:Continue Aranesp for management of anemia, persistent iron deficiency noted with iron saturation 14%- continue intravenous iron at this time. 4. CKD-MBD: Phosphorus levels improving with ongoing calcium acetate.  5. Atrial fibrillation:persistent and refractory to DCCV attempt in recent past. Unable to tolerate beta blocker due to relative hypotension   Subjective:   Complains of bilateral hip pain and reports large number of bowel movements with colonoscopy prep.   Objective:   BP 109/70 (BP Location: Right Arm)   Pulse 93   Temp 98.5 F (36.9 C) (Oral)   Resp 20   Ht 5\' 5"  (1.651 m)   Wt 79.8 kg (176 lb)   SpO2 99%   BMI 29.29 kg/m   Physical Exam: Gen: Appears comfortable resting in bed IBB:CWUGQ irregularly irregular, S1 and S2 normal  Resp:Poor inspiratory effort with decreased breath sounds over bases. Clear anteriorly  BVQ:XIHW, obese, nontender  Ext: No edema over legs. Venous stasis changes left leg >right leg. Left first stage BBF site intact with palpable thrill.  Labs: BMET  Recent Labs Lab 07/10/17 0456 07/11/17 0500 07/13/17 0605 07/15/17 0948  NA 133* 135 129* 130*  K 3.7 4.4 5.0 5.0  CL 95* 98* 95* 94*  CO2 27 28 23 25   GLUCOSE 87 73 95 134*  BUN 35* 27* 61* 55*  CREATININE 2.97* 2.43* 3.44*  3.62*  CALCIUM 8.7* 8.7* 9.0 8.8*  PHOS 4.2 4.1 6.0* 5.1*   CBC  Recent Labs Lab 07/13/17 1751 07/13/17 2121 07/14/17 0506 07/15/17 0950  WBC 9.1 8.2 11.6* 13.0*  HGB 7.8* 7.9* 8.0* 7.0*  HCT 24.2* 23.8* 24.8* 21.5*  MCV 79.1 79.1 79.7 79.9  PLT 146* 144* 129* 137*   Medications:    . amiodarone  200 mg Oral BID  . atorvastatin  20 mg Oral Daily  . calcium acetate  667 mg Oral TID WC  . darbepoetin (ARANESP) injection - NON-DIALYSIS  200 mcg Subcutaneous Q Sat-1800  . docusate sodium  100 mg Oral BID  . [START ON 07/17/2017] feeding supplement (NEPRO CARB STEADY)  237 mL Oral q morning - 10a  . heparin  40 Units/kg Dialysis Once in dialysis  . insulin aspart  0-15 Units Subcutaneous TID WC  . insulin aspart  4 Units Subcutaneous TID WC  . insulin detemir  8 Units Subcutaneous Q2200  . metoCLOPramide (REGLAN) injection  10 mg Intravenous Once  . metoprolol succinate  25 mg Oral Daily  . pantoprazole  40 mg Oral Daily  . polyethylene glycol  17 g Oral Daily  . predniSONE  20 mg Oral Q breakfast  . sodium chloride  2 spray Each Nare BID  . sodium chloride flush  3 mL Intravenous Q12H   Elmarie Shiley, MD 07/16/2017, 9:04 AM

## 2017-07-16 NOTE — Progress Notes (Signed)
Heparin drip d/c'ed per order. Will continue to monitor.

## 2017-07-16 NOTE — Interval H&P Note (Signed)
History and Physical Interval Note: Pt for EGD/colon today with MAC Cards and renal following closely Back in afib Receiving HD  Getting additional 2 u pRBC currently for Hgb 6 No overt bleeding Back pain continues HIGHER THAN BASELINE RISK.The nature of the procedure, as well as the risks, benefits, and alternatives were carefully and thoroughly reviewed with the patient. Ample time for discussion and questions allowed. The patient understood, was satisfied, and agreed to proceed.     07/16/2017 11:31 AM  Mendell Crable  has presented today for surgery, with the diagnosis of hematochezia, anemia.  cirrhosis  The various methods of treatment have been discussed with the patient and family. After consideration of risks, benefits and other options for treatment, the patient has consented to  Procedure(s): COLONOSCOPY (N/A) ESOPHAGOGASTRODUODENOSCOPY (EGD) (N/A) as a surgical intervention .  The patient's history has been reviewed, patient examined, no change in status, stable for surgery.  I have reviewed the patient's chart and labs.  Questions were answered to the patient's satisfaction.     Rashee Marschall M

## 2017-07-16 NOTE — Progress Notes (Signed)
Pt refused to drink last 750 mL of bowel prep this AM despite RN encouragement. Pt nearly pulled out his IV. Pt has had multiple brown watery stools. Will continue to monitor.

## 2017-07-16 NOTE — Anesthesia Procedure Notes (Signed)
Procedure Name: MAC Performed by: Valda Favia Pre-anesthesia Checklist: Patient identified, Emergency Drugs available, Suction available, Patient being monitored and Timeout performed Patient Re-evaluated:Patient Re-evaluated prior to induction Oxygen Delivery Method: Nasal cannula Preoxygenation: Pre-oxygenation with 100% oxygen Placement Confirmation: positive ETCO2 Dental Injury: Teeth and Oropharynx as per pre-operative assessment

## 2017-07-16 NOTE — Clinical Social Work Note (Addendum)
Spoke with Heart Failure RN who stated the Healthcare Professional section of the [name] application does not have to be filled out by an MD. CSW filled out, signed, and faxed to the [place], [place]  11:41 am CSW attempted calling [name] to confirm they received the fax. No answer or voicemail available. Will try again later.  Dayton Scrape, [place]  12:23 pm [name] confirmed they received the application. He has been pre-authorized until September 30th. He will have to have his interview before the expiration date. Service coordinator will try calling both patient and his daughter to set up a time for the interview. CSW advised that he is currently in a procedure but they will probably be able to leave a voicemail.  Dayton Scrape, [place]

## 2017-07-16 NOTE — Care Management (Addendum)
CM spoke with Michael [name] with HF team - team will arrange sleep study for pt.  No CM Needs

## 2017-07-17 ENCOUNTER — Encounter (HOSPITAL_COMMUNITY): Payer: Self-pay | Admitting: Internal Medicine

## 2017-07-17 ENCOUNTER — Other Ambulatory Visit: Payer: Self-pay | Admitting: *Deleted

## 2017-07-17 ENCOUNTER — Encounter: Payer: Self-pay | Admitting: Internal Medicine

## 2017-07-17 LAB — BASIC METABOLIC PANEL
ANION GAP: 10 (ref 5–15)
BUN: 37 mg/dL — ABNORMAL HIGH (ref 6–20)
CALCIUM: 8.9 mg/dL (ref 8.9–10.3)
CO2: 26 mmol/L (ref 22–32)
Chloride: 93 mmol/L — ABNORMAL LOW (ref 101–111)
Creatinine, Ser: 3.39 mg/dL — ABNORMAL HIGH (ref 0.61–1.24)
GFR calc Af Amer: 22 mL/min — ABNORMAL LOW (ref >60)
GFR calc non Af Amer: 19 mL/min — ABNORMAL LOW (ref >60)
GLUCOSE: 92 mg/dL (ref 65–99)
Potassium: 4.5 mmol/L (ref 3.5–5.1)
Sodium: 129 mmol/L — ABNORMAL LOW (ref 135–145)

## 2017-07-17 LAB — CBC
HCT: 25.6 % — ABNORMAL LOW (ref 39.0–52.0)
HEMOGLOBIN: 8.6 g/dL — AB (ref 13.0–17.0)
MCH: 27 pg (ref 26.0–34.0)
MCHC: 33.6 g/dL (ref 30.0–36.0)
MCV: 80.5 fL (ref 78.0–100.0)
Platelets: 129 10*3/uL — ABNORMAL LOW (ref 150–400)
RBC: 3.18 MIL/uL — ABNORMAL LOW (ref 4.22–5.81)
RDW: 20.2 % — ABNORMAL HIGH (ref 11.5–15.5)
WBC: 9.2 10*3/uL (ref 4.0–10.5)

## 2017-07-17 LAB — TYPE AND SCREEN
ABO/RH(D): B POS
Antibody Screen: NEGATIVE
UNIT DIVISION: 0
UNIT DIVISION: 0
Unit division: 0

## 2017-07-17 LAB — BPAM RBC
BLOOD PRODUCT EXPIRATION DATE: 201809022359
BLOOD PRODUCT EXPIRATION DATE: 201809042359
Blood Product Expiration Date: 201809032359
ISSUE DATE / TIME: 201808271008
ISSUE DATE / TIME: 201808301055
ISSUE DATE / TIME: 201808301055
UNIT TYPE AND RH: 1700
UNIT TYPE AND RH: 1700
Unit Type and Rh: 7300

## 2017-07-17 LAB — HEPARIN LEVEL (UNFRACTIONATED)
HEPARIN UNFRACTIONATED: 0.13 [IU]/mL — AB (ref 0.30–0.70)
HEPARIN UNFRACTIONATED: 0.24 [IU]/mL — AB (ref 0.30–0.70)
Heparin Unfractionated: >2.2 IU/mL — ABNORMAL HIGH (ref 0.30–0.70)

## 2017-07-17 LAB — GLUCOSE, CAPILLARY
Glucose-Capillary: 87 mg/dL (ref 65–99)
Glucose-Capillary: 95 mg/dL (ref 65–99)
Glucose-Capillary: 143 mg/dL — ABNORMAL HIGH (ref 65–99)

## 2017-07-17 LAB — PROTIME-INR
INR: 1.18
PROTHROMBIN TIME: 14.9 s (ref 11.4–15.2)

## 2017-07-17 MED ORDER — OXYCODONE HCL 5 MG PO TABS
ORAL_TABLET | ORAL | Status: AC
  Administered 2017-07-17: 5 mg via ORAL
  Filled 2017-07-17: qty 1

## 2017-07-17 MED ORDER — HEPARIN (PORCINE) IN NACL 100-0.45 UNIT/ML-% IJ SOLN
1700.0000 [IU]/h | INTRAMUSCULAR | Status: DC
  Administered 2017-07-18: 1700 [IU]/h via INTRAVENOUS
  Administered 2017-07-18: 1600 [IU]/h via INTRAVENOUS
  Administered 2017-07-20 (×2): 1700 [IU]/h via INTRAVENOUS
  Filled 2017-07-17 (×5): qty 250

## 2017-07-17 MED ORDER — METOPROLOL SUCCINATE ER 25 MG PO TB24
25.0000 mg | ORAL_TABLET | Freq: Two times a day (BID) | ORAL | Status: DC
  Administered 2017-07-17 – 2017-07-26 (×16): 25 mg via ORAL
  Filled 2017-07-17 (×16): qty 1

## 2017-07-17 MED ORDER — WARFARIN SODIUM 5 MG PO TABS
5.0000 mg | ORAL_TABLET | Freq: Once | ORAL | Status: AC
  Administered 2017-07-17: 5 mg via ORAL
  Filled 2017-07-17: qty 1

## 2017-07-17 NOTE — Progress Notes (Signed)
Patient ID: Michael [name], male   DOB: [birth year], 56 y.o.   MRN: [id number]      Advanced Heart Failure Rounding Note  Primary Cardiologist: Dr. Harrington Challenger HF Cardiology: Aundra Dubin  Subjective:    Started CVVHD 8/15 and stopped milrinone. CVVH stopped 8/21. Currently getting iHD.  Tolerating so far though BP drops.   On 8/23, he had TEE-guided DCCV to NSR.  Reverted back to AF 8/24  He had CT abdomen/pelvis 8/27: no retroperitoneal hematoma or other cause of fall in hemoglobin or back pain.   8/30 S/P EGD and Colonoscopy. 2 polyps removed.   Denies SOB. Complaining of back pain.    Studies: Echo (7/18): EF 50-55%, mild LVH, mild AI, bioprosthetic mitral valve appeared to function normally, RV moderately dilated with moderately decreased systolic function, D-shaped interventricular septum suggestive of RV pressure/volume overload, severe TR, PASP 54 mmHg  RHC Procedural Findings: Hemodynamics (mmHg) RA mean 27 RV 63/8, mean 20 PA 58/28, mean 40 PCWP mean 28 Oxygen saturations: PA 69% AO 96% Cardiac Output (Fick) 9.82  Cardiac Index (Fick) 4.96 PVR 1.2 WU  TEE (8/23): Normal LV size and systolic function, EF 59-93%.  Normal wall thickness and motion.  D-shaped interventricular septum suggestive of RV pressure/volume overload.  The RV was severely dilated with moderately decreased systolic function.  Severe LAE, no thrombus seen.  The LA appendage appears to have been ligated.  Moderate right atrial enlargement.  No PFO/ASD by color doppler.  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  Peak RV-RA gradient 25 mmHg.  Status post mitral valve replacement with bioprosthetic valve.  Trivial MR.  Mean gradient 6 mmHg across valve, does not appear to have significant stenosis.  Trileaflet aortic valve with moderate aortic regurgitation, no significant stenosis.  Normal caliber aorta with grade III plaque in descending thoracic aorta.   Objective:   Weight Range: 184 lb 4.9 oz  (83.6 kg) Body mass index is 30.67 kg/m.   Vital Signs:   Temp:  [97.5 F (36.4 C)-98.4 F (36.9 C)] 98 F (36.7 C) (08/31 0700) Pulse Rate:  [45-101] 98 (08/31 0730) Resp:  [11-21] 17 (08/31 0730) BP: (97-167)/(40-86) 108/72 (08/31 0730) SpO2:  [96 %-100 %] 98 % (08/31 0700) Weight:  [183 lb 12.8 oz (83.4 kg)-184 lb 4.9 oz (83.6 kg)] 184 lb 4.9 oz (83.6 kg) (08/31 0700) Last BM Date: 07/16/17  Weight change: Filed Weights   07/16/17 0249 07/17/17 0450 07/17/17 0700  Weight: 176 lb (79.8 kg) 183 lb 12.8 oz (83.4 kg) 184 lb 4.9 oz (83.6 kg)    Intake/Output:   Intake/Output Summary (Last 24 hours) at 07/17/17 0751 Last data filed at 07/17/17 0600  Gross per 24 hour  Intake          1795.99 ml  Output              100 ml  Net          1695.99 ml      Physical Exam  General:  Appear chronically ill.  No resp difficulty HEENT: normal Neck: supple. ~JVP 10 . Carotids 2+ bilat; no bruits. No lymphadenopathy or thryomegaly appreciated. Cor: PMI nondisplaced. Regular rate & rhythm. No rubs, gallops or murmurs. L HD catheter Lungs: clear Abdomen: soft, nontender, nondistended. No hepatosplenomegaly. No bruits or masses. Good bowel sounds. Extremities: no cyanosis, clubbing, rash, edema Neuro: alert & orientedx3, cranial nerves grossly intact. moves all 4 extremities w/o difficulty. Affect pleasant   Telemetry   Personally  reviewed A fib 90s  Labs    CBC  Recent Labs  07/16/17 0847 07/16/17 1940 07/17/17 0201  WBC 8.6  --  9.2  HGB 6.6* 9.6* 8.6*  HCT 20.5* 28.0* 25.6*  MCV 80.7  --  80.5  PLT 129*  --  062*   Basic Metabolic Panel  Recent Labs  07/15/17 0948 07/16/17 0847 07/17/17 0201  NA 130* 131* 129*  K 5.0 4.3 4.5  CL 94* 94* 93*  CO2 25 26 26   GLUCOSE 134* 82 92  BUN 55* 28* 37*  CREATININE 3.62* 2.60* 3.39*  CALCIUM 8.8* 8.8* 8.9  PHOS 5.1* 5.0*  --    Liver Function Tests  Recent Labs  07/15/17 0948 07/16/17 0847  ALBUMIN 3.0* 2.9*     No results for input(s): LIPASE, AMYLASE in the last 72 hours. Cardiac Enzymes No results for input(s): CKTOTAL, CKMB, CKMBINDEX, TROPONINI in the last 72 hours.  BNP: BNP (last 3 results)  Recent Labs  10/05/16 0147 05/30/17 1208 06/25/17 1253  BNP 237.4* 1,404.9* 875.7*    ProBNP (last 3 results)  Recent Labs  03/16/17 1401  PROBNP 6,530*   Imaging   Transthoracic Echocardiography 06/01/17  Study Conclusions  - Left ventricle: The cavity size was normal. There was mild   concentric hypertrophy. Systolic function was normal. The   estimated ejection fraction was in the range of 50% to 55%. Wall   motion was normal; there were no regional wall motion   abnormalities. - Ventricular septum: The contour showed diastolic flattening. - Aortic valve: Transvalvular velocity was minimally increased.   There was no stenosis. There was mild regurgitation. Peak   velocity (S): 201 cm/s. - Mitral valve: A bioprosthesis was present and functioning   normally. Mean gradient (D): 6 mm Hg. Valve area by pressure   half-time: 1.93 cm^2. - Left atrium: The atrium was severely dilated. - Right ventricle: The cavity size was moderately dilated. Wall   thickness was normal. Systolic function was moderately reduced. - Right atrium: The atrium was moderately dilated. - Tricuspid valve: There was severe regurgitation. - Pulmonary arteries: Systolic pressure was moderately increased.   PA peak pressure: 54 mm Hg (S).   RIGHT HEART CATH 06/29/17 RA mean 27 RV 63/8, mean 20 PA 58/28, mean 40 PCWP mean 28  Oxygen saturations: PA 69% AO 96%  Cardiac Output (Fick) 9.82  Cardiac Index (Fick) 4.96 PVR 1.2 WU     Medications:     Scheduled Medications: . amiodarone  200 mg Oral BID  . atorvastatin  20 mg Oral Daily  . calcium acetate  667 mg Oral TID WC  . darbepoetin (ARANESP) injection - NON-DIALYSIS  200 mcg Subcutaneous Q Sat-1800  . docusate sodium  100 mg Oral BID   . feeding supplement (NEPRO CARB STEADY)  237 mL Oral q morning - 10a  . heparin  40 Units/kg Dialysis Once in dialysis  . insulin aspart  0-15 Units Subcutaneous TID WC  . insulin aspart  4 Units Subcutaneous TID WC  . insulin detemir  8 Units Subcutaneous Q2200  . metoCLOPramide (REGLAN) injection  10 mg Intravenous Once  . metoprolol succinate  25 mg Oral Daily  . pantoprazole  40 mg Oral Daily  . polyethylene glycol  17 g Oral Daily  . predniSONE  20 mg Oral Q breakfast  . sodium chloride  2 spray Each Nare BID  . sodium chloride flush  3 mL Intravenous Q12H  . Warfarin - Pharmacist  Dosing Inpatient   Does not apply q1800    Infusions: . sodium chloride 0  (07/01/17 1500)  . sodium chloride Stopped (07/06/17 0814)  . sodium chloride    . ferric gluconate (FERRLECIT/NULECIT) IV Stopped (07/15/17 1230)  . 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 Patient underwent mitral valve replacement and tricuspid valve repair in [place] in 2012.    Assessment/Plan   1. Acute on chronic diastolic CHF/RV failure: EF 50-55% on echo in 7/18 with prominent RV failure (enlarged/hypokinetic RV with D-shaped interventricular septum), severe TR.  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   Milrinone was started at admission for RV support, stopped now that he is getting RRT.  CVVH stopped 8/21 and has been getting regular iHD since then.   - Accepted at [facility] for outpatient Mon/Wed/Friday  - Volume well controlled on HD. Appreciate renal care.  - Coreg stopped with low BP in HD.   2. AKI on CKD stage IV: Now getting RRT and off milrinone.  He is going to require long-term HD, significant RV failure will complicate this but he has done well with it so far.  He lives alone and doesn't have much help with his healthcare so chance at heart-kidney transplant would be very low.  - [facility] has seen. He had tunneled catheter and AV fistula 07/13/17.  - Tolerating HD so far though BP does drop.  3. Atrial fibrillation: Persistent.  Has been in atrial fibrillation for a number of months. S/p TEE-guided DCCV on 8/23.  Back in afib on 8/24 and has stayed in afib.  - Would not repeat DCCV.   - Continue Toprol XL 12.5 mg daily, continue amiodarone for now for rate control. HR in the 90s   Meds limited by hypotension.  - remains on heparin. Back on coumadin.  INR 1.18     4. Anemia: He has had BRBPR but not in the last few days.  Transfusion this admission. GI appreciated. S/P EGD/Colonoscopy 8/30 with a 2 polyps removed and internal hemorrhoids. - Received PRBC on 8/30 for hgb 6.6. Todays hgb 8.6.    -  Off ESA per renal 5. Bioprosthetic mitral valve: Stable appearance on 7/18 echo.  No change. 6. Probable sleep apnea: Will need sleep study outpatient. No change.  7. Acute gout in R foot: Prednisone 8/25 x 3 days. Improved.  8. Low back pain: Ongoing.  CT did not show RP hematoma or other cause for back pain. Colonoscopy today.  9. Disposition: Home 24 hours.   Outpatient HD set will need outpatient HD set up.   Length of Stay: [place], NP  07/17/2017, 7:51 AM  Advanced Heart Failure Team Pager [phone] (M-F; 7a - 4p)  Please contact [facility] for night-coverage  after hours (4p -7a ) and weekends on [email or web address]  Patient seen with NP, agree with the above note.    He tolerated HD today without problems.  INR remains low and he is on warfarin/heparin overlap.  He had DCCV this admission.  It did not hold, but likely he should have overlap to therapeutic INR.   Increase Toprol XL to 25 mg bid. If we can control HR successfully on Toprol XL alone, will stop amiodarone.   [name] 07/17/2017 1:05 PM

## 2017-07-17 NOTE — Hospital Discharge Follow-Up (Signed)
The patient has been known to the [facility]. Met with him this morning and he is agreeable to continued follow up at [facility]. An appointment has been scheduled for 07/28/17 @ 0945 and the information has been placed on the AVS.   As per Olga Coaster, RN CM, he has been approved for [name].  She also noted that Alvis Lemmings has approved him for [facility] and will continue to service him at home. He has little support at home for assistance with ADLs / homemaking. As per Natividad Brood, RN Margie Billet, they will continue to provide case management for him in the community.

## 2017-07-17 NOTE — Progress Notes (Signed)
Returned to room post hemodialysis treatment.  Patient is alert oriented x 4.  Heparin infusing at 15 ml/hr.

## 2017-07-17 NOTE — Procedures (Signed)
Patient seen on Hemodialysis. QB 400, UF goal 2.5L Treatment adjusted as needed.  Glendi Mohiuddin MD  [facility]. Office # [phone] Pager # [phone] 9:27 AM  

## 2017-07-17 NOTE — Progress Notes (Signed)
DCP 1) Patient is being followed by [facility];  2)[facility] for CHF  3) [facility] following patient also.   He does not qualify for SNF placement at this time; Support person - daughter

## 2017-07-17 NOTE — Progress Notes (Signed)
Dressing was on surgical incision site of left arm AC s/p AV fistula creation 07/13/17 by Dr. Donzetta Matters.  Spoke with Dr. Donzetta Matters via telephone and he stated to leave incision open to air.  Dressing removed.

## 2017-07-17 NOTE — Patient Outreach (Signed)
[facility]) Care Management  07/17/2017  Michael [name] 02/10/1961 [id number]   CSW noted that patient continues to remain hospitalized for Atrial Fibrillation.  CSW further noted that patient has been working with the [facility] Social Worker, [place] Scrape with regards to completing a [name] Paramedic) application through [employer]).  [name] was able to complete and sign the application, as well as fax it to the [employer] for processing.  [name] then confirmed with a representative from [name] that the application was received.  [name] was told that patient has been pre-authorized until Sunday, September 30th.  [name] informed patient that he will need to have his interview before the 30th.  Patient and daughter, [name] are both listed as points of contact.  CSW will continue to follow patient while in the community to ensure that he makes it to his interview, as well as to ensure that he is approved for [name] services.   [name], BSW, MSW, LCSW  Licensed Education officer, [facility]  Mailing [place] [address] [place], [place] [address] Physical Address-[address], [place], [place] [address] Toll Free Main # [phone] Fax # [phone] Cell # [phone]  Office # [phone] [email or web address]

## 2017-07-17 NOTE — Progress Notes (Signed)
ANTICOAGULATION CONSULT NOTE - Follow Up Consult  Pharmacy Consult for Heparin Indication: atrial fibrillation  No Known Allergies  Labs:  Recent Labs  07/15/17 0947 07/15/17 0948  07/15/17 0950 07/16/17 0847 07/16/17 1940 07/17/17 0201 07/17/17 1217 07/17/17 1427  HGB  --   --   < > 7.0* 6.6* 9.6* 8.6*  --   --   HCT  --   --   < > 21.5* 20.5* 28.0* 25.6*  --   --   PLT  --   --   --  137* 129*  --  129*  --   --   LABPROT 16.4*  --   --   --  15.6*  --  14.9  --   --   INR 1.34  --   --   --  1.25  --  1.18  --   --   HEPARINUNFRC 0.43  --   --   --  <0.10*  --  0.13* >2.20* 0.24*  CREATININE  --  3.62*  --   --  2.60*  --  3.39*  --   --   < > = values in this interval not displayed.  Estimated Creatinine Clearance: 23.5 mL/min (A) (by C-G formula based on SCr of 3.39 mg/dL (H)).   Medications: Heparin @ 1500 units/hr  Assessment: Pt on warfarin PTA for afib/MVR (bioprosthetic). Warfarin held pending HD access placement and he was started on a heparin bridge. S/P DCCV for Afib 8/23 but back in afib on amiodarone. Underwent fistula and TDC placement 8/27. Heparin and warfarin resumed s/p EGD and colonoscopy yesterday.   Heparin level slightly below goal this evening.  No overt bleeding or complications noted.  PTA warfarin dose was 2.5 mg daily  Goal of Therapy:  INR 2-3 Heparin level 0.3-0.5 Monitor platelets by anticoagulation protocol: Yes   Plan:  1) Increase IV heparin to 1600 units/hr. 2) Recheck heparin level in 8 hrs. 3) Daily heparin level and CBC.  Uvaldo Rising, BCPS  Clinical Pharmacist Pager [phone]  07/17/2017 3:57 PM

## 2017-07-17 NOTE — Progress Notes (Signed)
Educated patient and his daughter about left arm restrictions of not letting anyone stick finger or arm to get blood samples or to take blood pressure.  Using teachback method, patient and his daughter stated he is not to let anyone stick or take blood pressure in left arm because he needs this arm for dialysis.

## 2017-07-17 NOTE — Progress Notes (Signed)
Patient ID: Michael [name], male   DOB: [birth year], 56 y.o.   MRN: [id number]  [facility] Progress Note   Assessment/ Plan:   1. Acute on chronic diastolic heart failure (EF 50-55%) with long-standing mitral valve disease. Continue hemodialysis for ultrafiltration/volume management 2. ESRD with evidence of chronic cardiorenal syndrome exacerbated by recent CHF exacerbation, continue maintenance hemodialysis at this time with ultrafiltration. Continue hemodialysis on a Monday/Wednesday/Friday schedule-he has been accepted to [facility] where he will go to after discharge. 3. Anemia:Continue Aranesp for management of anemia, persistent iron deficiency noted with iron saturation 14%- continue intravenous iron at this time. 4. CKD-MBD: Phosphorus levels improving with ongoing calcium acetate.  5. Atrial fibrillation:persistent and refractory to DCCV attempt in recent past. Unable to tolerate beta blocker due to relative hypotension   Subjective:   Complains of continued back pain and reports of EGD/colonoscopy noted yesterday.   Objective:   BP 101/75   Pulse (!) 121   Temp 98 F (36.7 C) (Oral)   Resp 17   Ht 5\' 5"  (1.651 m)   Wt 83.6 kg (184 lb 4.9 oz) Comment: standing weight  SpO2 98%   BMI 30.67 kg/m   Physical Exam: Gen: Appears comfortable resting in dialysis GGY:IRSWN irregularly irregular tachycardia (107), S1 and S2 normal  Resp:Poor inspiratory effort with decreased breath sounds over bases. Clear anteriorly  IOE:VOJJ, obese, nontender  Ext: No edema over legs. Venous stasis changes left leg >right leg. Left first stage BBF site intact with palpable thrill.  Labs: BMET  Recent Labs Lab 07/11/17 0500 07/13/17 0605 07/15/17 0948 07/16/17 0847 07/17/17 0201  NA 135 129* 130* 131* 129*  K 4.4 5.0 5.0 4.3 4.5  CL 98* 95* 94* 94* 93*  CO2 28 23 25 26 26   GLUCOSE 73 95 134* 82 92  BUN 27* 61* 55* 28* 37*  CREATININE 2.43* 3.44* 3.62*  2.60* 3.39*  CALCIUM 8.7* 9.0 8.8* 8.8* 8.9  PHOS 4.1 6.0* 5.1* 5.0*  --    CBC  Recent Labs Lab 07/14/17 0506 07/15/17 0950 07/16/17 0847 07/16/17 1940 07/17/17 0201  WBC 11.6* 13.0* 8.6  --  9.2  HGB 8.0* 7.0* 6.6* 9.6* 8.6*  HCT 24.8* 21.5* 20.5* 28.0* 25.6*  MCV 79.7 79.9 80.7  --  80.5  PLT 129* 137* 129*  --  129*   Medications:    . amiodarone  200 mg Oral BID  . atorvastatin  20 mg Oral Daily  . calcium acetate  667 mg Oral TID WC  . darbepoetin (ARANESP) injection - NON-DIALYSIS  200 mcg Subcutaneous Q Sat-1800  . docusate sodium  100 mg Oral BID  . feeding supplement (NEPRO CARB STEADY)  237 mL Oral q morning - 10a  . heparin  40 Units/kg Dialysis Once in dialysis  . insulin aspart  0-15 Units Subcutaneous TID WC  . insulin aspart  4 Units Subcutaneous TID WC  . insulin detemir  8 Units Subcutaneous Q2200  . metoCLOPramide (REGLAN) injection  10 mg Intravenous Once  . metoprolol succinate  25 mg Oral Daily  . pantoprazole  40 mg Oral Daily  . polyethylene glycol  17 g Oral Daily  . predniSONE  20 mg Oral Q breakfast  . sodium chloride  2 spray Each Nare BID  . sodium chloride flush  3 mL Intravenous Q12H  . Warfarin - Pharmacist Dosing Inpatient   Does not apply K0938   Elmarie Shiley, MD 07/17/2017, 9:20 AM

## 2017-07-17 NOTE — Consult Note (Signed)
[facility] CM Inpatient Consult   07/17/2017  Michael [name] Jul 03, 1961 [id number]   Update/follow up:  Spoke with [name]. Liaison from [facility] regarding concerns for community follow up due to patient's transportation to hemodialysis, food resources and dietary restrictions for renal disease, and overall management.  Patient's daughter here now to work with inpatient staff for discharge planning.  Met with patient and daughter, offered an interpreter for discussion for [place] Management post hospital follow up and needs.  Patient declined interpreter and states, "She will do it."  Consent signed for daughter to interpret.  Daughter, [name], [phone]) states she works all day til about 5:30 pm daily and then goes to school.  She states her father used to have a room mate but things did not workout and he is in need for another housing option and needs assistance with this.  He uses [facility] for Pharmacy needs.  She is here to find out about if he has been approved for [name] and home care.  Explained that the inpatient care management staff will assist with answering those particular questions.  Patient and daughter in agreement for [place] Management follow up for post hospital needs and resources.  Patient has been assigned to [facility] care management team.  Updated inpatient Hassan Rowan C.on daughter's questions.  For questions, please contact:  Natividad Brood, RN BSN [facility] Liaison  [phone] business mobile phone Toll free office [phone]

## 2017-07-17 NOTE — Progress Notes (Signed)
CARDIAC REHAB PHASE I   PRE:  Rate/Rhythm: 98 afib    BP: sitting 96/57    SaO2: 98 RA  MODE:  Ambulation: 50 ft   POST:  Rate/Rhythm: 108 afib    BP: sitting 93/61     SaO2: 99 RA  Pt c/o severe back pain. Fairly steady with RW but c/o not go any farther. Daughter present and so we discussed HF, daily wts, low sodium. Encouraged them also to discuss diet with RD at HD. He declined watching HF video. [place], ACSM 07/17/2017 3:20 PM

## 2017-07-17 NOTE — Progress Notes (Signed)
ANTICOAGULATION CONSULT NOTE - Follow Up Consult  Pharmacy Consult for Heparin and Warfarin Indication: atrial fibrillation  No Known Allergies  Labs:  Recent Labs  07/15/17 0947 07/15/17 0948  07/15/17 0950 07/16/17 0847 07/16/17 1940 07/17/17 0201  HGB  --   --   < > 7.0* 6.6* 9.6* 8.6*  HCT  --   --   < > 21.5* 20.5* 28.0* 25.6*  PLT  --   --   --  137* 129*  --  129*  LABPROT 16.4*  --   --   --  15.6*  --  14.9  INR 1.34  --   --   --  1.25  --  1.18  HEPARINUNFRC 0.43  --   --   --  <0.10*  --  0.13*  CREATININE  --  3.62*  --   --  2.60*  --  3.39*  < > = values in this interval not displayed.  Estimated Creatinine Clearance: 23.5 mL/min (A) (by C-G formula based on SCr of 3.39 mg/dL (H)).   Medications: Heparin @ 1500 units/hr  Assessment: Pt on warfarin PTA for afib/MVR (bioprosthetic). Warfarin held pending HD access placement and he was started on a heparin bridge. S/P DCCV for Afib 8/23 but back in afib on amiodarone. Underwent fistula and TDC placement 8/27. Heparin and warfarin resumed s/p EGD and colonoscopy yesterday. Heparin rate was increased this morning and follow up level > 2.2. This was drawn from same arm as heparin infusing so inaccurate. Will redraw another level STAT.  INR remains below 1.18. Hgb improved after transfusion.   PTA warfarin dose was 2.5 mg daily  Goal of Therapy:  INR 2-3 Heparin level 0.3-0.5 Monitor platelets by anticoagulation protocol: Yes   Plan:  1) STAT heparin level redraw (RN to stop heparin and flush line before redrawing) 2) Increase warfarin to 5mg  tonight 3) Daily heparin level, INR, CBC  Nena Jordan, PharmD, BCPS 07/17/2017 1:29 PM

## 2017-07-17 NOTE — Progress Notes (Signed)
Redraw of Heparin level completed and Heparin turned off prior to lab draw then restarted after lab drawn.

## 2017-07-17 NOTE — Progress Notes (Signed)
ANTICOAGULATION CONSULT NOTE - Follow Up Consult  Pharmacy Consult for Heparin  Indication: atrial fibrillation  No Known Allergies  Patient Measurements: Height: 5\' 5"  (165.1 cm) Weight: 176 lb (79.8 kg) IBW/kg (Calculated) : 61.5  Vital Signs: Temp: 98.2 F (36.8 C) (08/30 2013) Temp Source: Oral (08/30 2013) BP: 119/69 (08/30 2013) Pulse Rate: 86 (08/30 2013)  Labs:  Recent Labs  07/15/17 0947 07/15/17 0948 07/15/17 0950 07/16/17 0847 07/16/17 1940 07/17/17 0201  HGB  --   --  7.0* 6.6* 9.6* 8.6*  HCT  --   --  21.5* 20.5* 28.0* 25.6*  PLT  --   --  137* 129*  --  129*  LABPROT 16.4*  --   --  15.6*  --  14.9  INR 1.34  --   --  1.25  --  1.18  HEPARINUNFRC 0.43  --   --  <0.10*  --  0.13*  CREATININE  --  3.62*  --  2.60*  --  3.39*    Estimated Creatinine Clearance: 23.4 mL/min (A) (by C-G formula based on SCr of 3.39 mg/dL (H)).   Assessment: Sub-therapeutic heparin level after re-start s/p EGD/colonoscopy on 8/30. Hgb down a little to 8.6 Received some blood 8/30. No issues per RN.   Goal of Therapy:  Heparin level 0.3-0.5 units/ml Monitor platelets by anticoagulation protocol: Yes   Plan:  -Inc heparin to 1500 units/hr -1200 HL  Narda Bonds 07/17/2017,3:03 AM

## 2017-07-18 LAB — CBC
HCT: 26.3 % — ABNORMAL LOW (ref 39.0–52.0)
HEMOGLOBIN: 8.5 g/dL — AB (ref 13.0–17.0)
MCH: 26.8 pg (ref 26.0–34.0)
MCHC: 32.3 g/dL (ref 30.0–36.0)
MCV: 83 fL (ref 78.0–100.0)
Platelets: 143 10*3/uL — ABNORMAL LOW (ref 150–400)
RBC: 3.17 MIL/uL — AB (ref 4.22–5.81)
RDW: 21.7 % — ABNORMAL HIGH (ref 11.5–15.5)
WBC: 7.9 10*3/uL (ref 4.0–10.5)

## 2017-07-18 LAB — GLUCOSE, CAPILLARY
GLUCOSE-CAPILLARY: 117 mg/dL — AB (ref 65–99)
Glucose-Capillary: 62 mg/dL — ABNORMAL LOW (ref 65–99)
Glucose-Capillary: 79 mg/dL (ref 65–99)
Glucose-Capillary: 128 mg/dL — ABNORMAL HIGH (ref 65–99)
Glucose-Capillary: 154 mg/dL — ABNORMAL HIGH (ref 65–99)

## 2017-07-18 LAB — BASIC METABOLIC PANEL
ANION GAP: 12 (ref 5–15)
BUN: 31 mg/dL — AB (ref 6–20)
CALCIUM: 8.9 mg/dL (ref 8.9–10.3)
CO2: 27 mmol/L (ref 22–32)
Chloride: 96 mmol/L — ABNORMAL LOW (ref 101–111)
Creatinine, Ser: 2.98 mg/dL — ABNORMAL HIGH (ref 0.61–1.24)
GFR calc Af Amer: 25 mL/min — ABNORMAL LOW (ref >60)
GFR, EST NON AFRICAN AMERICAN: 22 mL/min — AB (ref >60)
GLUCOSE: 97 mg/dL (ref 65–99)
POTASSIUM: 4 mmol/L (ref 3.5–5.1)
Sodium: 135 mmol/L (ref 135–145)

## 2017-07-18 LAB — HEPARIN LEVEL (UNFRACTIONATED)
HEPARIN UNFRACTIONATED: 0.27 [IU]/mL — AB (ref 0.30–0.70)
Heparin Unfractionated: 0.32 IU/mL (ref 0.30–0.70)

## 2017-07-18 LAB — PROTIME-INR
INR: 1.29
PROTHROMBIN TIME: 16 s — AB (ref 11.4–15.2)

## 2017-07-18 MED ORDER — INSULIN ASPART 100 UNIT/ML ~~LOC~~ SOLN: 0.0000 [IU] | Freq: Every day | SUBCUTANEOUS | Status: DC

## 2017-07-18 MED ORDER — INSULIN ASPART 100 UNIT/ML ~~LOC~~ SOLN
0.0000 [IU] | Freq: Three times a day (TID) | SUBCUTANEOUS | Status: DC
  Administered 2017-07-18: 1 [IU] via SUBCUTANEOUS
  Administered 2017-07-19 – 2017-07-21 (×2): 2 [IU] via SUBCUTANEOUS
  Administered 2017-07-23: 5 [IU] via SUBCUTANEOUS
  Administered 2017-07-25 – 2017-07-26 (×2): 2 [IU] via SUBCUTANEOUS
  Administered 2017-07-26: 1 [IU] via SUBCUTANEOUS
  Administered 2017-07-26: 3 [IU] via SUBCUTANEOUS
  Administered 2017-07-27: 2 [IU] via SUBCUTANEOUS

## 2017-07-18 MED ORDER — WARFARIN SODIUM 5 MG PO TABS
5.0000 mg | ORAL_TABLET | Freq: Once | ORAL | Status: AC
  Administered 2017-07-18: 5 mg via ORAL
  Filled 2017-07-18: qty 1

## 2017-07-18 MED ORDER — AMIODARONE HCL 200 MG PO TABS
200.0000 mg | ORAL_TABLET | Freq: Every day | ORAL | Status: DC
  Administered 2017-07-18 – 2017-07-19 (×2): 200 mg via ORAL
  Filled 2017-07-18 (×2): qty 1

## 2017-07-18 MED ORDER — INSULIN ASPART 100 UNIT/ML ~~LOC~~ SOLN
0.0000 [IU] | Freq: Every day | SUBCUTANEOUS | Status: DC
  Administered 2017-07-24: 2 [IU] via SUBCUTANEOUS
  Administered 2017-07-25: 3 [IU] via SUBCUTANEOUS

## 2017-07-18 NOTE — Progress Notes (Signed)
ANTICOAGULATION CONSULT NOTE - Follow Up Consult  Pharmacy Consult for Heparin  Indication: atrial fibrillation  No Known Allergies  Patient Measurements: Height: 5\' 5"  (165.1 cm) Weight: 178 lb 2.1 oz (80.8 kg) IBW/kg (Calculated) : 61.5  Vital Signs: Temp: 99.6 F (37.6 C) (08/31 2026) Temp Source: Oral (08/31 2026) BP: 94/65 (08/31 2026) Pulse Rate: 98 (08/31 2026)  Labs:  Recent Labs  07/15/17 8502 07/15/17 7741  07/15/17 0950 07/16/17 0847 07/16/17 1940 07/17/17 0201 07/17/17 1217 07/17/17 1427 07/17/17 2317  HGB  --   --   < > 7.0* 6.6* 9.6* 8.6*  --   --   --   HCT  --   --   < > 21.5* 20.5* 28.0* 25.6*  --   --   --   PLT  --   --   --  137* 129*  --  129*  --   --   --   LABPROT 16.4*  --   --   --  15.6*  --  14.9  --   --   --   INR 1.34  --   --   --  1.25  --  1.18  --   --   --   HEPARINUNFRC 0.43  --   --   --  <0.10*  --  0.13* >2.20* 0.24* 0.32  CREATININE  --  3.62*  --   --  2.60*  --  3.39*  --   --   --   < > = values in this interval not displayed.  Estimated Creatinine Clearance: 23.5 mL/min (A) (by C-G formula based on SCr of 3.39 mg/dL (H)).   Assessment: Therapeutic heparin level x 1 after rate increase  Goal of Therapy:  Heparin level 0.3-0.5 units/ml Monitor platelets by anticoagulation protocol: Yes   Plan:  -Cont heparin at 1600 units/hr -Heparin level with AM labs  Narda Bonds 07/18/2017,12:44 AM

## 2017-07-18 NOTE — Progress Notes (Addendum)
Patient ID: Michael [name], male   DOB: 09-Jul-1961, 56 y.o.   MRN: [id number]  [facility] Progress Note   Assessment/ Plan:   1. Acute on chronic diastolic heart failure (EF 50-55%) with long-standing mitral valve disease. He remains on the hypervolemic end with ultrafiltration limited by intradialytic hypotension-will continue to reassess strategies for successful ultrafiltration and attaining dry weight. 2. 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 Continue hemodialysis on a Monday/Wednesday/Friday schedule-he has been accepted to [facility]. No acute indications for dialysis at this time-will order for hemodialysis again on Monday. 3. Anemia:Continue Aranesp for management of anemia, persistent iron deficiency noted with iron saturation 14%- continue intravenous iron at this time. 4. CKD-MBD: Phosphorus levels improving with ongoing calcium acetate.  5. Atrial fibrillation:persistent and refractory to DCCV attempt in recent past. Unable to tolerate beta blocker due to relative hypotension   Subjective:   He continues to complain of intermittent back pain although better with ambulation .   Objective:   BP (!) 90/55 (BP Location: Right Arm)   Pulse 70   Temp 98.2 F (36.8 C) (Oral)   Resp 18   Ht 5\' 5"  (1.651 m)   Wt 83.3 kg (183 lb 11.2 oz)   SpO2 95%   BMI 30.57 kg/m   Physical Exam: Gen: Appears comfortable resting in bed QMV:HQION irregularly irregular, normal rate, S1 and S2 normal  Resp:Poor inspiratory effort with decreased breath sounds over bases. Clear anteriorly  GEX:BMWU, obese, nontender  Ext: No edema over legs. Venous stasis changes left leg >right leg. Left first stage BBF site intact with palpable thrill. Bruising noted over both upper extremities and right IJ TDC insertion site.  Labs: BMET  Recent Labs Lab 07/13/17 0605 07/15/17 0948 07/16/17 0847 07/17/17 0201 07/18/17 0652  NA 129* 130*  131* 129* 135  K 5.0 5.0 4.3 4.5 4.0  CL 95* 94* 94* 93* 96*  CO2 23 25 26 26 27   GLUCOSE 95 134* 82 92 97  BUN 61* 55* 28* 37* 31*  CREATININE 3.44* 3.62* 2.60* 3.39* 2.98*  CALCIUM 9.0 8.8* 8.8* 8.9 8.9  PHOS 6.0* 5.1* 5.0*  --   --    CBC  Recent Labs Lab 07/15/17 0950 07/16/17 0847 07/16/17 1940 07/17/17 0201 07/18/17 0652  WBC 13.0* 8.6  --  9.2 7.9  HGB 7.0* 6.6* 9.6* 8.6* 8.5*  HCT 21.5* 20.5* 28.0* 25.6* 26.3*  MCV 79.9 80.7  --  80.5 83.0  PLT 137* 129*  --  129* 143*   Medications:    . amiodarone  200 mg Oral BID  . atorvastatin  20 mg Oral Daily  . calcium acetate  667 mg Oral TID WC  . darbepoetin (ARANESP) injection - NON-DIALYSIS  200 mcg Subcutaneous Q Sat-1800  . docusate sodium  100 mg Oral BID  . feeding supplement (NEPRO CARB STEADY)  237 mL Oral q morning - 10a  . heparin  40 Units/kg Dialysis Once in dialysis  . insulin aspart  0-15 Units Subcutaneous TID WC  . insulin aspart  4 Units Subcutaneous TID WC  . insulin detemir  8 Units Subcutaneous Q2200  . metoCLOPramide (REGLAN) injection  10 mg Intravenous Once  . metoprolol succinate  25 mg Oral BID  . pantoprazole  40 mg Oral Daily  . polyethylene glycol  17 g Oral Daily  . predniSONE  20 mg Oral Q breakfast  . sodium chloride  2 spray Each Nare BID  . sodium  chloride flush  3 mL Intravenous Q12H  . warfarin  5 mg Oral ONCE-1800  . Warfarin - Pharmacist Dosing Inpatient   Does not apply [id number]   Elmarie Shiley, MD 07/18/2017, 9:15 AM

## 2017-07-18 NOTE — Progress Notes (Signed)
Patient ID: Michael [name], male   DOB: 01/31/1961, 56 y.o.   MRN: [id number]      Advanced Heart Failure Rounding Note  Primary Cardiologist: Dr. Harrington Challenger HF Cardiology: Aundra Dubin  Subjective:    Started CVVHD 8/15 and stopped milrinone. CVVH stopped 8/21. Currently getting iHD.  Tolerating so far though BP drops.   On 8/23, he had TEE-guided DCCV to NSR.  Reverted back to AF 8/24  He had CT abdomen/pelvis 8/27: no retroperitoneal hematoma or other cause of fall in hemoglobin or back pain.   8/30 S/P EGD and Colonoscopy. 2 polyps removed.   Back pain improving, did some walking yesterday.  HR seems better-controlled.     Studies: Echo (7/18): EF 50-55%, mild LVH, mild AI, bioprosthetic mitral valve appeared to function normally, RV moderately dilated with moderately decreased systolic function, D-shaped interventricular septum suggestive of RV pressure/volume overload, severe TR, PASP 54 mmHg  [facility] Procedural Findings: Hemodynamics (mmHg) RA mean 27 RV 63/8, mean 20 PA 58/28, mean 40 PCWP mean 28 Oxygen saturations: PA 69% AO 96% Cardiac Output (Fick) 9.82  Cardiac Index (Fick) 4.96 PVR 1.2 WU  TEE (8/23): Normal LV size and systolic function, EF 97-02%.  Normal wall thickness and motion.  D-shaped interventricular septum suggestive of RV pressure/volume overload.  The RV was severely dilated with moderately decreased systolic function.  Severe LAE, no thrombus seen.  The LA appendage appears to have been ligated.  Moderate right atrial enlargement.  No PFO/ASD by color doppler.  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  Peak RV-RA gradient 25 mmHg.  Status post mitral valve replacement with bioprosthetic valve.  Trivial MR.  Mean gradient 6 mmHg across valve, does not appear to have significant stenosis.  Trileaflet aortic valve with moderate aortic regurgitation, no significant stenosis.  Normal caliber aorta with grade III plaque in descending thoracic aorta.    Objective:   Weight Range: 183 lb 11.2 oz (83.3 kg) Body mass index is 30.57 kg/m.   Vital Signs:   Temp:  [97.6 F (36.4 C)-99.6 F (37.6 C)] 98.2 F (36.8 C) (09/01 0618) Pulse Rate:  [70-109] 70 (09/01 0618) Resp:  [16-18] 18 (09/01 0618) BP: (90-111)/(55-86) 90/55 (09/01 0618) SpO2:  [95 %-100 %] 95 % (09/01 0618) Weight:  [178 lb 2.1 oz (80.8 kg)-183 lb 11.2 oz (83.3 kg)] 183 lb 11.2 oz (83.3 kg) (09/01 0618) Last BM Date: 07/17/17  Weight change: Filed Weights   07/17/17 0700 07/17/17 1111 07/18/17 0618  Weight: 184 lb 4.9 oz (83.6 kg) 178 lb 2.1 oz (80.8 kg) 183 lb 11.2 oz (83.3 kg)    Intake/Output:   Intake/Output Summary (Last 24 hours) at 07/18/17 1027 Last data filed at 07/18/17 0915  Gross per 24 hour  Intake          1418.87 ml  Output             1250 ml  Net           168.87 ml      Physical Exam  General: NAD Neck: JVP 10-12 cm, no thyromegaly or thyroid nodule.  Lungs: Clear to auscultation bilaterally with normal respiratory effort. CV: Nondisplaced PMI.  Heart irregular S1/S2, no S3/S4, no murmur.  No peripheral edema.   Abdomen: Soft, nontender, no hepatosplenomegaly, no distention.  Skin: Intact without lesions or rashes.  Neurologic: Alert and oriented x 3.  Psych: Normal affect. Extremities: No clubbing or cyanosis.  HEENT: Normal.    Telemetry  Personally reviewed A fib 90s  Labs    CBC  Recent Labs  07/17/17 0201 07/18/17 0652  WBC 9.2 7.9  HGB 8.6* 8.5*  HCT 25.6* 26.3*  MCV 80.5 83.0  PLT 129* 485*   Basic Metabolic Panel  Recent Labs  07/16/17 0847 07/17/17 0201 07/18/17 0652  NA 131* 129* 135  K 4.3 4.5 4.0  CL 94* 93* 96*  CO2 26 26 27   GLUCOSE 82 92 97  BUN 28* 37* 31*  CREATININE 2.60* 3.39* 2.98*  CALCIUM 8.8* 8.9 8.9  PHOS 5.0*  --   --    Liver Function Tests  Recent Labs  07/16/17 0847  ALBUMIN 2.9*   No results for input(s): LIPASE, AMYLASE in the last 72 hours. Cardiac Enzymes No  results for input(s): CKTOTAL, CKMB, CKMBINDEX, TROPONINI in the last 72 hours.  BNP: BNP (last 3 results)  Recent Labs  10/05/16 0147 05/30/17 1208 06/25/17 1253  BNP 237.4* 1,404.9* 875.7*    ProBNP (last 3 results)  Recent Labs  03/16/17 1401  PROBNP 6,530*   Imaging   Transthoracic Echocardiography 06/01/17  Study Conclusions  - Left ventricle: The cavity size was normal. There was mild   concentric hypertrophy. Systolic function was normal. The   estimated ejection fraction was in the range of 50% to 55%. Wall   motion was normal; there were no regional wall motion   abnormalities. - Ventricular septum: The contour showed diastolic flattening. - Aortic valve: Transvalvular velocity was minimally increased.   There was no stenosis. There was mild regurgitation. Peak   velocity (S): 201 cm/s. - Mitral valve: A bioprosthesis was present and functioning   normally. Mean gradient (D): 6 mm Hg. Valve area by pressure   half-time: 1.93 cm^2. - Left atrium: The atrium was severely dilated. - Right ventricle: The cavity size was moderately dilated. Wall   thickness was normal. Systolic function was moderately reduced. - Right atrium: The atrium was moderately dilated. - Tricuspid valve: There was severe regurgitation. - Pulmonary arteries: Systolic pressure was moderately increased.   PA peak pressure: 54 mm Hg (S).   RIGHT HEART CATH 06/29/17 RA mean 27 RV 63/8, mean 20 PA 58/28, mean 40 PCWP mean 28  Oxygen saturations: PA 69% AO 96%  Cardiac Output (Fick) 9.82  Cardiac Index (Fick) 4.96 PVR 1.2 WU     Medications:     Scheduled Medications: . amiodarone  200 mg Oral Daily  . atorvastatin  20 mg Oral Daily  . calcium acetate  667 mg Oral TID WC  . darbepoetin (ARANESP) injection - NON-DIALYSIS  200 mcg Subcutaneous Q Sat-1800  . docusate sodium  100 mg Oral BID  . feeding supplement (NEPRO CARB STEADY)  237 mL Oral q morning - 10a  . heparin  40  Units/kg Dialysis Once in dialysis  . insulin aspart  0-15 Units Subcutaneous TID WC  . insulin aspart  4 Units Subcutaneous TID WC  . insulin detemir  8 Units Subcutaneous Q2200  . metoCLOPramide (REGLAN) injection  10 mg Intravenous Once  . metoprolol succinate  25 mg Oral BID  . pantoprazole  40 mg Oral Daily  . polyethylene glycol  17 g Oral Daily  . predniSONE  20 mg Oral Q breakfast  . sodium chloride  2 spray Each Nare BID  . sodium chloride flush  3 mL Intravenous Q12H  . warfarin  5 mg Oral ONCE-1800  . Warfarin - Pharmacist Dosing Inpatient   Does not  apply q1800    Infusions: . sodium chloride 0  (07/01/17 1500)  . sodium chloride Stopped (07/06/17 0814)  . sodium chloride    . ferric gluconate (FERRLECIT/NULECIT) IV 125 mg (07/17/17 0930)  . 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 Patient underwent mitral valve replacement and tricuspid valve repair in [place] in 2012.    Assessment/Plan   1. Acute on chronic diastolic CHF/RV failure: EF 50-55% on echo in 7/18 with prominent RV failure (enlarged/hypokinetic RV with D-shaped interventricular septum), severe TR.  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  Milrinone was started at admission for RV support, stopped now  that he is getting RRT.  CVVH stopped 8/21 and has been getting regular iHD since then.   - Accepted at [facility] for outpatient Mon/Wed/Friday  - Volume ok on HD. Appreciate renal care.  2. AKI on CKD stage IV: Now getting RRT and off milrinone.  He is going to require long-term HD, significant RV failure will complicate this but he has done well with it so far.  He lives alone and doesn't have much help with his healthcare so chance at heart-kidney transplant would be very low.  - [facility] has seen. He had tunneled catheter and AV fistula 07/13/17.  - Tolerating HD so far though BP does drop.  3. Atrial fibrillation: Persistent.  Has been in atrial fibrillation for a number of months. S/p TEE-guided DCCV on 8/23.  Back in afib on 8/24 and has stayed in afib.  - Would not repeat DCCV.   - Continue Toprol XL 25 mg bid, will back off on amiodarone to 200 mg daily with goal to eventually stop.    - INR 1.29, continue heparin gtt + warfarin until INR therapeutic.  4. Anemia: He has had BRBPR but not in the last few days.  Transfusion this admission. GI appreciated. S/P EGD/Colonoscopy 8/30 with a 2 polyps removed and internal hemorrhoids. - Received PRBC on 8/30 for hgb 6.6. Todays hgb 8.5.    5. Bioprosthetic mitral valve: Stable appearance on 7/18 echo.  No change. 6. Probable sleep apnea: Will need sleep study outpatient. No change.  7. Acute gout in R foot: Prednisone 8/25 x 3 days. Improved.  8. Low back pain: Ongoing.  CT did not show RP hematoma or other cause for back pain. Colonoscopy today.  9. Disposition: Home when INR therapeutic, would plan for Monday after HD that day hopefully.   Length of Stay: 66  [name], MD  07/18/2017, 10:27 AM  Advanced Heart Failure Team Pager [phone] (M-F; 7a - 4p)  Please contact [facility] for night-coverage after hours (4p -7a ) and weekends on [email or web address]

## 2017-07-18 NOTE — Progress Notes (Signed)
[id number] Patient declined to walk at this time due to back pain, just receiving pain medication from nurse. Encouraged pt to walk with nursing staff when back feeling better.  Sol Passer, MS, ACSM CEP

## 2017-07-18 NOTE — Progress Notes (Signed)
ANTICOAGULATION CONSULT NOTE - Follow Up Consult  Pharmacy Consult for Heparin Indication: atrial fibrillation  No Known Allergies  Heparin dosing weight: 79 kg  Labs:  Recent Labs  07/16/17 0847 07/16/17 1940 07/17/17 0201  07/17/17 1427 07/17/17 2317 07/18/17 0652  HGB 6.6* 9.6* 8.6*  --   --   --  8.5*  HCT 20.5* 28.0* 25.6*  --   --   --  26.3*  PLT 129*  --  129*  --   --   --  143*  LABPROT 15.6*  --  14.9  --   --   --  16.0*  INR 1.25  --  1.18  --   --   --  1.29  HEPARINUNFRC <0.10*  --  0.13*  < > 0.24* 0.32 0.27*  CREATININE 2.60*  --  3.39*  --   --   --  2.98*  < > = values in this interval not displayed.  Estimated Creatinine Clearance: 27.2 mL/min (A) (by C-G formula based on SCr of 2.98 mg/dL (H)).   Assessment: Pt on warfarin PTA for afib/MVR (bioprosthetic). Warfarin held pending HD access placement and he was started on a heparin bridge. S/P DCCV for Afib 8/23 but back in afib on amiodarone. Underwent fistula and TDC placement 8/27. Heparin and warfarin resumed s/p EGD and colonoscopy.   Heparin level slightly below goal at 0.27 and his INR is 1.29. CBC ok, no bleeding noted.  PTA warfarin dose was 2.5 mg daily  Goal of Therapy:  INR 2-3 Heparin level 0.3-0.5 Monitor platelets by anticoagulation protocol: Yes   Plan:  Increase IV heparin to 1700 units/hr. Warfarin 5mg  x 1 Daily HL, INR, CBC Monitor for s/sx of bleeding  Patterson Hammersmith PharmD PGY1 Pharmacy Practice Resident 07/18/2017 8:20 AM Pager: [phone]

## 2017-07-18 NOTE — Progress Notes (Signed)
Hypoglycemic Event  CBG: 62  Treatment: juice  Symptoms: none  Follow-up CBG: Time:1210 CBG Result:79  Possible Reasons for Event: unknown  Comments/MD notified:Luke Munising, PA    Alonna Buckler

## 2017-07-19 LAB — GLUCOSE, CAPILLARY
Glucose-Capillary: 108 mg/dL — ABNORMAL HIGH (ref 65–99)
Glucose-Capillary: 96 mg/dL (ref 65–99)
Glucose-Capillary: 158 mg/dL — ABNORMAL HIGH (ref 65–99)
Glucose-Capillary: 141 mg/dL — ABNORMAL HIGH (ref 65–99)

## 2017-07-19 LAB — CBC
HEMATOCRIT: 26.3 % — AB (ref 39.0–52.0)
Hemoglobin: 8.3 g/dL — ABNORMAL LOW (ref 13.0–17.0)
MCH: 26.3 pg (ref 26.0–34.0)
MCHC: 31.6 g/dL (ref 30.0–36.0)
MCV: 83.2 fL (ref 78.0–100.0)
PLATELETS: 153 10*3/uL (ref 150–400)
RBC: 3.16 MIL/uL — ABNORMAL LOW (ref 4.22–5.81)
RDW: 22.2 % — ABNORMAL HIGH (ref 11.5–15.5)
WBC: 8.1 10*3/uL (ref 4.0–10.5)

## 2017-07-19 LAB — BASIC METABOLIC PANEL
Anion gap: 14 (ref 5–15)
BUN: 48 mg/dL — AB (ref 6–20)
CHLORIDE: 96 mmol/L — AB (ref 101–111)
CO2: 25 mmol/L (ref 22–32)
CREATININE: 3.87 mg/dL — AB (ref 0.61–1.24)
Calcium: 9.2 mg/dL (ref 8.9–10.3)
GFR calc non Af Amer: 16 mL/min — ABNORMAL LOW (ref >60)
GFR, EST AFRICAN AMERICAN: 18 mL/min — AB (ref >60)
Glucose, Bld: 94 mg/dL (ref 65–99)
Potassium: 4.1 mmol/L (ref 3.5–5.1)
Sodium: 135 mmol/L (ref 135–145)

## 2017-07-19 LAB — PROTIME-INR
INR: 1.44
Prothrombin Time: 17.4 seconds — ABNORMAL HIGH (ref 11.4–15.2)

## 2017-07-19 LAB — HEPARIN LEVEL (UNFRACTIONATED): Heparin Unfractionated: 0.35 IU/mL (ref 0.30–0.70)

## 2017-07-19 MED ORDER — WARFARIN SODIUM 5 MG PO TABS
5.0000 mg | ORAL_TABLET | Freq: Once | ORAL | Status: AC
  Administered 2017-07-19: 5 mg via ORAL
  Filled 2017-07-19: qty 1

## 2017-07-19 NOTE — Progress Notes (Signed)
Patient ID: Michael [name], male   DOB: Aug 01, 1961, 56 y.o.   MRN: [id number]      Advanced Heart Failure Rounding Note  Primary Cardiologist: Dr. Harrington Challenger HF Cardiology: Aundra Dubin  Subjective:    Started CVVHD 8/15 and stopped milrinone. CVVH stopped 8/21. Currently getting iHD.  Tolerating so far though BP drops.   On 8/23, he had TEE-guided DCCV to NSR.  Reverted back to AF 8/24  He had CT abdomen/pelvis 8/27: no retroperitoneal hematoma or other cause of fall in hemoglobin or back pain.   8/30 S/P EGD and Colonoscopy. 2 polyps removed.   Back pain better yesterday, back again today.  Legs swelling more.  HR controlled in 80s-90s.  INR 1.44.   Studies: Echo (7/18): EF 50-55%, mild LVH, mild AI, bioprosthetic mitral valve appeared to function normally, RV moderately dilated with moderately decreased systolic function, D-shaped interventricular septum suggestive of RV pressure/volume overload, severe TR, PASP 54 mmHg  [facility] Procedural Findings: Hemodynamics (mmHg) RA mean 27 RV 63/8, mean 20 PA 58/28, mean 40 PCWP mean 28 Oxygen saturations: PA 69% AO 96% Cardiac Output (Fick) 9.82  Cardiac Index (Fick) 4.96 PVR 1.2 WU  TEE (8/23): Normal LV size and systolic function, EF 56-31%.  Normal wall thickness and motion.  D-shaped interventricular septum suggestive of RV pressure/volume overload.  The RV was severely dilated with moderately decreased systolic function.  Severe LAE, no thrombus seen.  The LA appendage appears to have been ligated.  Moderate right atrial enlargement.  No PFO/ASD by color doppler.  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  Peak RV-RA gradient 25 mmHg.  Status post mitral valve replacement with bioprosthetic valve.  Trivial MR.  Mean gradient 6 mmHg across valve, does not appear to have significant stenosis.  Trileaflet aortic valve with moderate aortic regurgitation, no significant stenosis.  Normal caliber aorta with grade III plaque in descending  thoracic aorta.   Objective:   Weight Range: 190 lb 8 oz (86.4 kg) Body mass index is 31.7 kg/m.   Vital Signs:   Temp:  [97.6 F (36.4 C)-98.2 F (36.8 C)] 97.8 F (36.6 C) (09/02 0520) Pulse Rate:  [59-99] 99 (09/02 0827) Resp:  [20] 20 (09/02 0520) BP: (95-110)/(62-67) 110/62 (09/02 0827) SpO2:  [98 %-99 %] 99 % (09/02 0520) Weight:  [190 lb 8 oz (86.4 kg)] 190 lb 8 oz (86.4 kg) (09/02 0520) Last BM Date: 07/18/17  Weight change: Filed Weights   07/17/17 1111 07/18/17 0618 07/19/17 0520  Weight: 178 lb 2.1 oz (80.8 kg) 183 lb 11.2 oz (83.3 kg) 190 lb 8 oz (86.4 kg)    Intake/Output:   Intake/Output Summary (Last 24 hours) at 07/19/17 0923 Last data filed at 07/19/17 0900  Gross per 24 hour  Intake          1842.05 ml  Output              275 ml  Net          1567.05 ml      Physical Exam  General: NAD Neck: JVP 14 cm, no thyromegaly or thyroid nodule.  Lungs: Decreased breath sounds at bases bilaterally. CV: Nondisplaced PMI.  Heart regular S1/S2, no S3/S4, no murmur.  1+ edema to knees bilaterally.   Abdomen: Soft, nontender, no hepatosplenomegaly, no distention.  Skin: Intact without lesions or rashes.  Neurologic: Alert and oriented x 3.  Psych: Normal affect. Extremities: No clubbing or cyanosis.  HEENT: Normal.    Telemetry  Personally reviewed atrial fibrillation 80s-90s  Labs    CBC  Recent Labs  07/18/17 0652 07/19/17 0532  WBC 7.9 8.1  HGB 8.5* 8.3*  HCT 26.3* 26.3*  MCV 83.0 83.2  PLT 143* 992   Basic Metabolic Panel  Recent Labs  07/18/17 0652 07/19/17 0532  NA 135 135  K 4.0 4.1  CL 96* 96*  CO2 27 25  GLUCOSE 97 94  BUN 31* 48*  CREATININE 2.98* 3.87*  CALCIUM 8.9 9.2   Liver Function Tests No results for input(s): AST, ALT, ALKPHOS, BILITOT, PROT, ALBUMIN in the last 72 hours. No results for input(s): LIPASE, AMYLASE in the last 72 hours. Cardiac Enzymes No results for input(s): CKTOTAL, CKMB, CKMBINDEX,  TROPONINI in the last 72 hours.  BNP: BNP (last 3 results)  Recent Labs  10/05/16 0147 05/30/17 1208 06/25/17 1253  BNP 237.4* 1,404.9* 875.7*    ProBNP (last 3 results)  Recent Labs  03/16/17 1401  PROBNP 6,530*   Imaging   Transthoracic Echocardiography 06/01/17  Study Conclusions  - Left ventricle: The cavity size was normal. There was mild   concentric hypertrophy. Systolic function was normal. The   estimated ejection fraction was in the range of 50% to 55%. Wall   motion was normal; there were no regional wall motion   abnormalities. - Ventricular septum: The contour showed diastolic flattening. - Aortic valve: Transvalvular velocity was minimally increased.   There was no stenosis. There was mild regurgitation. Peak   velocity (S): 201 cm/s. - Mitral valve: A bioprosthesis was present and functioning   normally. Mean gradient (D): 6 mm Hg. Valve area by pressure   half-time: 1.93 cm^2. - Left atrium: The atrium was severely dilated. - Right ventricle: The cavity size was moderately dilated. Wall   thickness was normal. Systolic function was moderately reduced. - Right atrium: The atrium was moderately dilated. - Tricuspid valve: There was severe regurgitation. - Pulmonary arteries: Systolic pressure was moderately increased.   PA peak pressure: 54 mm Hg (S).   RIGHT HEART CATH 06/29/17 RA mean 27 RV 63/8, mean 20 PA 58/28, mean 40 PCWP mean 28  Oxygen saturations: PA 69% AO 96%  Cardiac Output (Fick) 9.82  Cardiac Index (Fick) 4.96 PVR 1.2 WU     Medications:     Scheduled Medications: . atorvastatin  20 mg Oral Daily  . calcium acetate  667 mg Oral TID WC  . darbepoetin (ARANESP) injection - NON-DIALYSIS  200 mcg Subcutaneous Q Sat-1800  . docusate sodium  100 mg Oral BID  . feeding supplement (NEPRO CARB STEADY)  237 mL Oral q morning - 10a  . heparin  40 Units/kg Dialysis Once in dialysis  . insulin aspart  0-5 Units Subcutaneous QHS    . insulin aspart  0-9 Units Subcutaneous TID WC  . metoCLOPramide (REGLAN) injection  10 mg Intravenous Once  . metoprolol succinate  25 mg Oral BID  . pantoprazole  40 mg Oral Daily  . polyethylene glycol  17 g Oral Daily  . predniSONE  20 mg Oral Q breakfast  . sodium chloride  2 spray Each Nare BID  . sodium chloride flush  3 mL Intravenous Q12H  . Warfarin - Pharmacist Dosing Inpatient   Does not apply q1800    Infusions: . sodium chloride 0  (07/01/17 1500)  . sodium chloride Stopped (07/06/17 0814)  . sodium chloride    . ferric gluconate (FERRLECIT/NULECIT) IV 125 mg (07/17/17 0930)  . 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 Patient underwent mitral valve replacement and tricuspid valve repair in [place] in 2012.    Assessment/Plan   1. Acute on chronic diastolic CHF/RV failure: EF 50-55% on echo in 7/18 with prominent RV failure (enlarged/hypokinetic RV with D-shaped interventricular septum), severe TR.  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  Milrinone was started at admission for RV support, stopped now that he is getting RRT.  CVVH stopped 8/21 and has been getting regular iHD since then.  Volume overloaded on exam today.  - Accepted at [facility]  for outpatient Mon/Wed/Friday  - Will have HD tomorrow.   2. AKI on CKD stage IV: Now getting RRT and off milrinone.  He is going to require long-term HD, significant RV failure will complicate this but he has done well with it so far.  He lives alone and doesn't have much help with his healthcare so chance at heart-kidney transplant would be very low.  - [facility] has seen. He had tunneled catheter and AV fistula 07/13/17.  - Tolerating HD so far though BP does drop.  3. Atrial fibrillation: Persistent.  Has been in atrial fibrillation for a number of months. S/p TEE-guided DCCV on 8/23.  Back in afib on 8/24 and has stayed in afib. HR better controlled at this point.  - Would not repeat DCCV.   - Continue Toprol XL 25 mg bid, can stop amiodarone today.    - INR 1.44, continue heparin gtt + warfarin until INR therapeutic.  4. Anemia: He has had BRBPR but not in the last few days.  Transfusion this admission. GI appreciated. S/P EGD/Colonoscopy 8/30 with a 2 polyps removed and internal hemorrhoids. - Received PRBC on 8/30 for hgb 6.6. Todays hgb 8.3.    5. Bioprosthetic mitral valve: Stable appearance on 7/18 echo.  No change. 6. Probable sleep apnea: Will need sleep study outpatient. No change.  7. Acute gout in R foot: Prednisone 8/25 x 3 days. Improved.  8. Low back pain: Ongoing.  CT did not show RP hematoma or other cause for back pain. Colonoscopy was ok.  9. Disposition: Home when INR therapeutic => will see what INR is tomorrow, potentially could go home after HD.    Length of Stay: [place], MD  07/19/2017, 9:23 AM  Advanced Heart Failure Team Pager [phone] (M-F; 7a - 4p)  Please contact [facility] for night-coverage after hours (4p -7a ) and weekends on [email or web address]

## 2017-07-19 NOTE — Progress Notes (Signed)
Patient ID: Michael [name], male   DOB: 12-11-1960, 56 y.o.   MRN: [id number]  [facility] Progress Note   Assessment/ Plan:   1. Acute on chronic diastolic heart failure (EF 50-55%) with long-standing mitral valve disease. He remains on the hypervolemic end with ultrafiltration limited by intradialytic hypotension-will continue to reassess strategies for successful ultrafiltration and attaining dry weight. 2. 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 Continue hemodialysis on a Monday/Wednesday/Friday schedule With next hemodialysis ordered for tomorrow. He will continue outpatient hemodialysis at [facility] . 3. Anemia:Continue Aranesp for management of anemia, persistent iron deficiency noted with iron saturation 14%- continue intravenous iron at this time. 4. CKD-MBD: Phosphorus levels improving with ongoing calcium acetate. Will check PTH level today. 5. Atrial fibrillation:persistent and refractory to DCCV attempt in recent past. Unable to tolerate beta blocker due to relative hypotension . Ongoing anticoagulation with Coumadin and anticipated discharge possibly tomorrow (INR today 1.4)  Subjective:   Continues to have intermittent back pain but overall better and able to ambulate.  Objective:   BP 110/62   Pulse 99   Temp 97.8 F (36.6 C) (Oral)   Resp 20   Ht 5\' 5"  (1.651 m)   Wt 86.4 kg (190 lb 8 oz)   SpO2 99%   BMI 31.70 kg/m   Physical Exam: Gen: Appears comfortable resting in bed HUT:MLYYT irregularly irregular, normal rate, S1 and S2 normal  Resp:Poor inspiratory effort with decreased breath sounds over bases. Clear anteriorly  KPT:WSFK, obese, nontender  Ext: No edema over legs. Venous stasis changes left leg >right leg. Left first stage BBF site intact with palpable thrill. Bruising noted over both upper extremities and right IJ TDC insertion site.  Labs: BMET  Recent Labs Lab 07/13/17 0605  07/15/17 0948 07/16/17 0847 07/17/17 0201 07/18/17 0652 07/19/17 0532  NA 129* 130* 131* 129* 135 135  K 5.0 5.0 4.3 4.5 4.0 4.1  CL 95* 94* 94* 93* 96* 96*  CO2 23 25 26 26 27 25   GLUCOSE 95 134* 82 92 97 94  BUN 61* 55* 28* 37* 31* 48*  CREATININE 3.44* 3.62* 2.60* 3.39* 2.98* 3.87*  CALCIUM 9.0 8.8* 8.8* 8.9 8.9 9.2  PHOS 6.0* 5.1* 5.0*  --   --   --    CBC  Recent Labs Lab 07/16/17 0847 07/16/17 1940 07/17/17 0201 07/18/17 0652 07/19/17 0532  WBC 8.6  --  9.2 7.9 8.1  HGB 6.6* 9.6* 8.6* 8.5* 8.3*  HCT 20.5* 28.0* 25.6* 26.3* 26.3*  MCV 80.7  --  80.5 83.0 83.2  PLT 129*  --  129* 143* 153   Medications:    . atorvastatin  20 mg Oral Daily  . calcium acetate  667 mg Oral TID WC  . darbepoetin (ARANESP) injection - NON-DIALYSIS  200 mcg Subcutaneous Q Sat-1800  . docusate sodium  100 mg Oral BID  . feeding supplement (NEPRO CARB STEADY)  237 mL Oral q morning - 10a  . heparin  40 Units/kg Dialysis Once in dialysis  . insulin aspart  0-5 Units Subcutaneous QHS  . insulin aspart  0-9 Units Subcutaneous TID WC  . metoCLOPramide (REGLAN) injection  10 mg Intravenous Once  . metoprolol succinate  25 mg Oral BID  . pantoprazole  40 mg Oral Daily  . polyethylene glycol  17 g Oral Daily  . predniSONE  20 mg Oral Q breakfast  . sodium chloride  2 spray Each Nare BID  . sodium  chloride flush  3 mL Intravenous Q12H  . Warfarin - Pharmacist Dosing Inpatient   Does not apply A1287   Elmarie Shiley, MD 07/19/2017, 9:36 AM

## 2017-07-19 NOTE — Progress Notes (Signed)
ANTICOAGULATION CONSULT NOTE - Follow Up Consult  Pharmacy Consult for Heparin Indication: atrial fibrillation  No Known Allergies  Heparin dosing weight: 79 kg  Labs:  Recent Labs  07/17/17 0201  07/17/17 2317 07/18/17 0652 07/19/17 0532  HGB 8.6*  --   --  8.5* 8.3*  HCT 25.6*  --   --  26.3* 26.3*  PLT 129*  --   --  143* 153  LABPROT 14.9  --   --  16.0* 17.4*  INR 1.18  --   --  1.29 1.44  HEPARINUNFRC 0.13*  < > 0.32 0.27* 0.35  CREATININE 3.39*  --   --  2.98* 3.87*  < > = values in this interval not displayed.  Estimated Creatinine Clearance: 21.3 mL/min (A) (by C-G formula based on SCr of 3.87 mg/dL (H)).   Assessment: Pt on warfarin PTA for afib/MVR (bioprosthetic). Warfarin held pending HD access placement and he was started on a heparin bridge. S/P DCCV for Afib 8/23 but back in afib on amiodarone. Underwent fistula and TDC placement 8/27. Heparin and warfarin resumed s/p EGD and colonoscopy.  Plans for d/c when INR at goal -INR= 1.44, heparin level= 0.35  PTA warfarin dose was 2.5 mg daily  Goal of Therapy:  INR 2-3 Heparin level 0.3-0.5 Monitor platelets by anticoagulation protocol: Yes   Plan:  No heparin changes needed Warfarin 5mg  x 1 Daily HL, INR, CBC  Hildred Laser, Pharm D 07/19/2017 10:47 AM

## 2017-07-20 LAB — BASIC METABOLIC PANEL
Anion gap: 9 (ref 5–15)
BUN: 64 mg/dL — ABNORMAL HIGH (ref 6–20)
CHLORIDE: 95 mmol/L — AB (ref 101–111)
CO2: 27 mmol/L (ref 22–32)
Calcium: 9 mg/dL (ref 8.9–10.3)
Creatinine, Ser: 4.75 mg/dL — ABNORMAL HIGH (ref 0.61–1.24)
GFR calc Af Amer: 14 mL/min — ABNORMAL LOW (ref >60)
GFR calc non Af Amer: 12 mL/min — ABNORMAL LOW (ref >60)
GLUCOSE: 95 mg/dL (ref 65–99)
POTASSIUM: 4.6 mmol/L (ref 3.5–5.1)
SODIUM: 131 mmol/L — AB (ref 135–145)

## 2017-07-20 LAB — PROTIME-INR
INR: 1.93
Prothrombin Time: 21.9 seconds — ABNORMAL HIGH (ref 11.4–15.2)

## 2017-07-20 LAB — CBC
HEMATOCRIT: 26.5 % — AB (ref 39.0–52.0)
Hemoglobin: 8.5 g/dL — ABNORMAL LOW (ref 13.0–17.0)
MCH: 26.9 pg (ref 26.0–34.0)
MCHC: 32.1 g/dL (ref 30.0–36.0)
MCV: 83.9 fL (ref 78.0–100.0)
Platelets: 164 10*3/uL (ref 150–400)
RBC: 3.16 MIL/uL — ABNORMAL LOW (ref 4.22–5.81)
RDW: 22.6 % — AB (ref 11.5–15.5)
WBC: 7.3 10*3/uL (ref 4.0–10.5)

## 2017-07-20 LAB — GLUCOSE, CAPILLARY
GLUCOSE-CAPILLARY: 94 mg/dL (ref 65–99)
Glucose-Capillary: 86 mg/dL (ref 65–99)

## 2017-07-20 LAB — HEPARIN LEVEL (UNFRACTIONATED): Heparin Unfractionated: 0.45 IU/mL (ref 0.30–0.70)

## 2017-07-20 MED ORDER — WARFARIN SODIUM 2.5 MG PO TABS
2.5000 mg | ORAL_TABLET | Freq: Every day | ORAL | Status: AC
  Administered 2017-07-20: 2.5 mg via ORAL
  Filled 2017-07-20: qty 1

## 2017-07-20 MED ORDER — LIDOCAINE-PRILOCAINE 2.5-2.5 % EX CREA: 1.0000 "application " | TOPICAL_CREAM | CUTANEOUS | Status: DC | PRN

## 2017-07-20 MED ORDER — MIDODRINE HCL 5 MG PO TABS
5.0000 mg | ORAL_TABLET | Freq: Three times a day (TID) | ORAL | Status: DC
  Administered 2017-07-20 – 2017-07-27 (×20): 5 mg via ORAL
  Filled 2017-07-20 (×17): qty 1

## 2017-07-20 MED ORDER — LIDOCAINE HCL (PF) 1 % IJ SOLN: 5.0000 mL | INTRAMUSCULAR | Status: DC | PRN

## 2017-07-20 MED ORDER — SODIUM CHLORIDE 0.9 % IV SOLN: 100.0000 mL | INTRAVENOUS | Status: DC | PRN

## 2017-07-20 MED ORDER — HEPARIN SODIUM (PORCINE) 1000 UNIT/ML DIALYSIS: 20.0000 [IU]/kg | INTRAMUSCULAR | Status: DC | PRN

## 2017-07-20 MED ORDER — PENTAFLUOROPROP-TETRAFLUOROETH EX AERO: 1.0000 "application " | INHALATION_SPRAY | CUTANEOUS | Status: DC | PRN

## 2017-07-20 MED ORDER — HEPARIN SODIUM (PORCINE) 1000 UNIT/ML DIALYSIS: 40.0000 [IU]/kg | INTRAMUSCULAR | Status: DC | PRN

## 2017-07-20 MED ORDER — HEPARIN SODIUM (PORCINE) 1000 UNIT/ML DIALYSIS: 1000.0000 [IU] | INTRAMUSCULAR | Status: DC | PRN

## 2017-07-20 MED ORDER — ALTEPLASE 2 MG IJ SOLR: 2.0000 mg | Freq: Once | INTRAMUSCULAR | Status: DC | PRN

## 2017-07-20 NOTE — Progress Notes (Signed)
ANTICOAGULATION CONSULT NOTE - Follow Up Consult  Pharmacy Consult for Heparin Indication: atrial fibrillation  No Known Allergies  Heparin dosing weight: 79 kg  Labs:  Recent Labs  07/18/17 0652 07/19/17 0532 07/20/17 0339  HGB 8.5* 8.3* 8.5*  HCT 26.3* 26.3* 26.5*  PLT 143* 153 164  LABPROT 16.0* 17.4* 21.9*  INR 1.29 1.44 1.93  HEPARINUNFRC 0.27* 0.35 0.45  CREATININE 2.98* 3.87* 4.75*    Estimated Creatinine Clearance: 17.3 mL/min (A) (by C-G formula based on SCr of 4.75 mg/dL (H)).   Assessment: Pt on warfarin PTA for afib/MVR (bioprosthetic).  S/P DCCV for Afib 8/23 but back in afib on amiodarone (discontinued). Underwent fistula and TDC placement 8/27. Heparin and warfarin resumed s/p EGD and colonoscopy.  No noted bleeding although H/H is low (stable).  Received 1 ut. PRBC for GI bleed. -INR= 1.96, heparin level= 0.45  PTA warfarin dose was 2.5 mg daily  Goal of Therapy:  INR 2-3 Heparin level 0.3-0.5 Monitor platelets by anticoagulation protocol: Yes   Plan:  No heparin changes needed - could likely discontinue today or tomorrow. Warfarin 2.5mg  daily (home regimen) Daily HL, INR, CBC  Rober Minion, PharmD., MS Clinical Pharmacist Pager:  [phone] Thank you for allowing pharmacy to be part of this patients care team. 07/20/2017 12:10 PM

## 2017-07-20 NOTE — Care Management Important Message (Signed)
Important Message  Patient Details  Name: Michael [name] MRN: [id number] Date of Birth: 03/27/1961   Medicare Important Message Given:  Yes    Marija Calamari 07/20/2017, 11:00 AM

## 2017-07-20 NOTE — Procedures (Signed)
Assessment/ Plan:   1. Acute on chronic diastolic heart failure (EF 50-55%) with long-standing mitral valve disease s/p MV repair, mod to severe TR, RV & RA dilatation. He remains on the hypervolemic end with ultrafiltration limited by intradialytic hypotension-will  2. 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 Persistent volume overload 3. Anemia:Aranesp, iron saturation 14%- intravenous ir 5. Atrial fibrillation:persistent and refractory to DCCV on coumadin  Tol HD but soft BP. Marked overload.  Add Midodrine.  Daily HD to optimize volume.  Azarel Banner C

## 2017-07-20 NOTE — Progress Notes (Signed)
PT Cancellation Note  Patient Details Name: Michael [name] MRN: [id number] DOB: December 14, 1960   Cancelled Treatment:    Reason Eval/Treat Not Completed: Patient at procedure or test/unavailable Pt currently in HD. Will follow up as schedule allows.   Leighton Ruff, PT, DPT  Acute Rehabilitation Services  Pager: [phone]    Rudean Hitt 07/20/2017, 9:04 AM

## 2017-07-20 NOTE — Progress Notes (Addendum)
Patient ID: Michael [name], male   DOB: 06-Oct-1961, 56 y.o.   MRN: [id number]      Advanced Heart Failure Rounding Note  Primary Cardiologist: Dr. Harrington Challenger HF Cardiology: Aundra Dubin  Subjective:    Started CVVHD 8/15 and stopped milrinone. CVVH stopped 8/21. Currently getting iHD.  Tolerating so far though BP drops.   On 8/23, he had TEE-guided DCCV to NSR.  Reverted back to AF 8/24  He had CT abdomen/pelvis 8/27: no retroperitoneal hematoma or other cause of fall in hemoglobin or back pain.   8/30 S/P EGD and Colonoscopy. 2 polyps removed.   Still with back pain but overall better.  INR up to 1.93 today.  Getting HD today. Still in AF but HR now controlled off amiodarone.    Studies: Echo (7/18): EF 50-55%, mild LVH, mild AI, bioprosthetic mitral valve appeared to function normally, RV moderately dilated with moderately decreased systolic function, D-shaped interventricular septum suggestive of RV pressure/volume overload, severe TR, PASP 54 mmHg  [facility] Procedural Findings: Hemodynamics (mmHg) RA mean 27 RV 63/8, mean 20 PA 58/28, mean 40 PCWP mean 28 Oxygen saturations: PA 69% AO 96% Cardiac Output (Fick) 9.82  Cardiac Index (Fick) 4.96 PVR 1.2 WU  TEE (8/23): Normal LV size and systolic function, EF 65-68%.  Normal wall thickness and motion.  D-shaped interventricular septum suggestive of RV pressure/volume overload.  The RV was severely dilated with moderately decreased systolic function.  Severe LAE, no thrombus seen.  The LA appendage appears to have been ligated.  Moderate right atrial enlargement.  No PFO/ASD by color doppler.  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  Peak RV-RA gradient 25 mmHg.  Status post mitral valve replacement with bioprosthetic valve.  Trivial MR.  Mean gradient 6 mmHg across valve, does not appear to have significant stenosis.  Trileaflet aortic valve with moderate aortic regurgitation, no significant stenosis.  Normal caliber aorta with  grade III plaque in descending thoracic aorta.   Objective:   Weight Range: 195 lb 8.8 oz (88.7 kg) Body mass index is 32.54 kg/m.   Vital Signs:   Temp:  [98 F (36.7 C)-98.6 F (37 C)] 98.2 F (36.8 C) (09/03 0740) Pulse Rate:  [64-103] 67 (09/03 0830) Resp:  [18-20] 18 (09/03 0830) BP: (95-121)/(61-94) 108/64 (09/03 0830) SpO2:  [98 %-100 %] 98 % (09/03 0740) Weight:  [195 lb 1.6 oz (88.5 kg)-195 lb 8.8 oz (88.7 kg)] 195 lb 8.8 oz (88.7 kg) (09/03 0740) Last BM Date: 07/20/17  Weight change: Filed Weights   07/19/17 0520 07/20/17 0646 07/20/17 0740  Weight: 190 lb 8 oz (86.4 kg) 195 lb 1.6 oz (88.5 kg) 195 lb 8.8 oz (88.7 kg)    Intake/Output:   Intake/Output Summary (Last 24 hours) at 07/20/17 0926 Last data filed at 07/20/17 0405  Gross per 24 hour  Intake              726 ml  Output              125 ml  Net              601 ml      Physical Exam   General: NAD Neck: JVP 12 cm, no thyromegaly or thyroid nodule.  Lungs: Decreased breath sounds at bases bilaterally. CV: Nondisplaced PMI.  Heart regular S1/S2, no S3/S4, no murmur.  1+ edema to knees bilaterally.   Abdomen: Soft, nontender, no hepatosplenomegaly, no distention.  Skin: Intact without lesions or rashes.  Neurologic: Alert and oriented x 3.  Psych: Normal affect. Extremities: No clubbing or cyanosis.  HEENT: Normal.   Telemetry   Personally reviewed atrial fibrillation 80s-90s  Labs    CBC  Recent Labs  07/19/17 0532 07/20/17 0339  WBC 8.1 7.3  HGB 8.3* 8.5*  HCT 26.3* 26.5*  MCV 83.2 83.9  PLT 153 283   Basic Metabolic Panel  Recent Labs  07/19/17 0532 07/20/17 0339  NA 135 131*  K 4.1 4.6  CL 96* 95*  CO2 25 27  GLUCOSE 94 95  BUN 48* 64*  CREATININE 3.87* 4.75*  CALCIUM 9.2 9.0   Liver Function Tests No results for input(s): AST, ALT, ALKPHOS, BILITOT, PROT, ALBUMIN in the last 72 hours. No results for input(s): LIPASE, AMYLASE in the last 72 hours. Cardiac  Enzymes No results for input(s): CKTOTAL, CKMB, CKMBINDEX, TROPONINI in the last 72 hours.  BNP: BNP (last 3 results)  Recent Labs  10/05/16 0147 05/30/17 1208 06/25/17 1253  BNP 237.4* 1,404.9* 875.7*    ProBNP (last 3 results)  Recent Labs  03/16/17 1401  PROBNP 6,530*   Imaging   Transthoracic Echocardiography 06/01/17  Study Conclusions  - Left ventricle: The cavity size was normal. There was mild   concentric hypertrophy. Systolic function was normal. The   estimated ejection fraction was in the range of 50% to 55%. Wall   motion was normal; there were no regional wall motion   abnormalities. - Ventricular septum: The contour showed diastolic flattening. - Aortic valve: Transvalvular velocity was minimally increased.   There was no stenosis. There was mild regurgitation. Peak   velocity (S): 201 cm/s. - Mitral valve: A bioprosthesis was present and functioning   normally. Mean gradient (D): 6 mm Hg. Valve area by pressure   half-time: 1.93 cm^2. - Left atrium: The atrium was severely dilated. - Right ventricle: The cavity size was moderately dilated. Wall   thickness was normal. Systolic function was moderately reduced. - Right atrium: The atrium was moderately dilated. - Tricuspid valve: There was severe regurgitation. - Pulmonary arteries: Systolic pressure was moderately increased.   PA peak pressure: 54 mm Hg (S).   RIGHT HEART CATH 06/29/17 RA mean 27 RV 63/8, mean 20 PA 58/28, mean 40 PCWP mean 28  Oxygen saturations: PA 69% AO 96%  Cardiac Output (Fick) 9.82  Cardiac Index (Fick) 4.96 PVR 1.2 WU     Medications:     Scheduled Medications: . atorvastatin  20 mg Oral Daily  . calcium acetate  667 mg Oral TID WC  . darbepoetin (ARANESP) injection - NON-DIALYSIS  200 mcg Subcutaneous Q Sat-1800  . docusate sodium  100 mg Oral BID  . feeding supplement (NEPRO CARB STEADY)  237 mL Oral q morning - 10a  . heparin  40 Units/kg Dialysis Once  in dialysis  . insulin aspart  0-5 Units Subcutaneous QHS  . insulin aspart  0-9 Units Subcutaneous TID WC  . metoCLOPramide (REGLAN) injection  10 mg Intravenous Once  . metoprolol succinate  25 mg Oral BID  . pantoprazole  40 mg Oral Daily  . polyethylene glycol  17 g Oral Daily  . predniSONE  20 mg Oral Q breakfast  . sodium chloride  2 spray Each Nare BID  . sodium chloride flush  3 mL Intravenous Q12H  . Warfarin - Pharmacist Dosing Inpatient   Does not apply q1800    Infusions: . sodium chloride 0  (07/01/17 1500)  . sodium chloride Stopped (  07/06/17 4970)  . sodium chloride    . ferric gluconate (FERRLECIT/NULECIT) IV 125 mg (07/17/17 0930)  . 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 Patient underwent mitral valve replacement and tricuspid valve repair in [place] in 2012.    Assessment/Plan   1. Acute on chronic diastolic CHF/RV failure: EF 50-55% on echo in 7/18 with prominent RV failure (enlarged/hypokinetic RV with D-shaped interventricular septum), severe TR.  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  Milrinone was started at admission for RV support, stopped now that he is getting RRT.  CVVH stopped 8/21 and has been getting regular iHD since then.  Getting HD  today. - Accepted at [facility] for outpatient Mon/Wed/Friday  2. AKI on CKD stage IV: Now getting RRT and off milrinone.  He is going to require long-term HD, significant RV failure will complicate this but he has done well with it so far.  He lives alone and doesn't have much help with his healthcare so chance at heart-kidney transplant would be very low.  - VVS has seen. He had tunneled catheter and AV fistula 07/13/17.  - Tolerating HD so far.  3. Atrial fibrillation: Persistent.  Has been in atrial fibrillation for a number of months. S/p TEE-guided DCCV on 8/23.  Back in afib on 8/24 and has stayed in afib. HR better controlled at this point.  - Would not repeat DCCV.   - Continue Toprol XL 25 mg bid, now off amiodarone.    - INR 1.93, think he can stop heparin at discharge and go home today.  4. Anemia: He has had BRBPR but not in the last few days.  Transfusion this admission. GI appreciated. S/P EGD/Colonoscopy 8/30 with a 2 polyps removed and internal hemorrhoids. - Received PRBC on 8/30 for hgb 6.6. Todays hgb 8.5.    5. Bioprosthetic mitral valve: Stable appearance on 7/18 echo.  No change. 6. Probable sleep apnea: Will need sleep study outpatient. No change.  7. Acute gout in R foot: Prednisone 8/25 x 3 days. Improved.  8. Low back pain: Ongoing with slow improvement.  CT did not show RP hematoma or other cause for back pain. Colonoscopy was ok.  9. Disposition: Think we can stop heparin gtt and send home today.  Will need MWF HD at [facility].  Will need followup with cardiology in couple of weeks (can see Dr. Harrington Challenger).  Needs home health/PT.  Will need followup with coumadin clinic => IMPORTANT THAT HE UNDERSTANDS THIS.  Cardiac meds for home: Warfarin per pharmacy, Toprol XL 25 mg bid, atorvastatin 20 mg daily.   Length of Stay: 8  [name], MD  07/20/2017, 9:26 AM  Advanced Heart Failure Team Pager [phone] (M-F; 7a - 4p)  Please contact [facility] for night-coverage after hours (4p -7a ) and weekends on [email or web address]  Discussed with nephrology, will hold off on discharge.  Weight has been going up, want more  fluid dialyzed off before he goes home.  Will dialyze today and again tomorrow.   [name] 07/20/2017

## 2017-07-21 LAB — PROTIME-INR
INR: 2.31
Prothrombin Time: 25.2 seconds — ABNORMAL HIGH (ref 11.4–15.2)

## 2017-07-21 LAB — CBC
HCT: 27.5 % — ABNORMAL LOW (ref 39.0–52.0)
HEMOGLOBIN: 8.6 g/dL — AB (ref 13.0–17.0)
MCH: 26.5 pg (ref 26.0–34.0)
MCHC: 31.3 g/dL (ref 30.0–36.0)
MCV: 84.9 fL (ref 78.0–100.0)
Platelets: 173 10*3/uL (ref 150–400)
RBC: 3.24 MIL/uL — AB (ref 4.22–5.81)
RDW: 22.6 % — ABNORMAL HIGH (ref 11.5–15.5)
WBC: 7.9 10*3/uL (ref 4.0–10.5)

## 2017-07-21 LAB — BASIC METABOLIC PANEL
ANION GAP: 15 (ref 5–15)
BUN: 48 mg/dL — ABNORMAL HIGH (ref 6–20)
CHLORIDE: 96 mmol/L — AB (ref 101–111)
CO2: 21 mmol/L — AB (ref 22–32)
Calcium: 8.9 mg/dL (ref 8.9–10.3)
Creatinine, Ser: 3.73 mg/dL — ABNORMAL HIGH (ref 0.61–1.24)
GFR calc non Af Amer: 17 mL/min — ABNORMAL LOW (ref >60)
GFR, EST AFRICAN AMERICAN: 19 mL/min — AB (ref >60)
GLUCOSE: 116 mg/dL — AB (ref 65–99)
Potassium: 4.3 mmol/L (ref 3.5–5.1)
Sodium: 132 mmol/L — ABNORMAL LOW (ref 135–145)

## 2017-07-21 LAB — GLUCOSE, CAPILLARY
GLUCOSE-CAPILLARY: 138 mg/dL — AB (ref 65–99)
Glucose-Capillary: 120 mg/dL — ABNORMAL HIGH (ref 65–99)
Glucose-Capillary: 170 mg/dL — ABNORMAL HIGH (ref 65–99)
Glucose-Capillary: 100 mg/dL — ABNORMAL HIGH (ref 65–99)

## 2017-07-21 LAB — PARATHYROID HORMONE, INTACT (NO CA): PTH: 48 pg/mL (ref 15–65)

## 2017-07-21 LAB — HEPARIN LEVEL (UNFRACTIONATED): Heparin Unfractionated: 0.43 IU/mL (ref 0.30–0.70)

## 2017-07-21 MED ORDER — HEPARIN SODIUM (PORCINE) 1000 UNIT/ML DIALYSIS: 1000.0000 [IU] | INTRAMUSCULAR | Status: DC | PRN

## 2017-07-21 MED ORDER — ALTEPLASE 2 MG IJ SOLR: 2.0000 mg | Freq: Once | INTRAMUSCULAR | Status: DC | PRN

## 2017-07-21 MED ORDER — LIDOCAINE-PRILOCAINE 2.5-2.5 % EX CREA: 1.0000 "application " | TOPICAL_CREAM | CUTANEOUS | Status: DC | PRN

## 2017-07-21 MED ORDER — WARFARIN SODIUM 2 MG PO TABS
1.0000 mg | ORAL_TABLET | Freq: Once | ORAL | Status: AC
  Administered 2017-07-21: 1 mg via ORAL
  Filled 2017-07-21: qty 1

## 2017-07-21 MED ORDER — PENTAFLUOROPROP-TETRAFLUOROETH EX AERO: 1.0000 "application " | INHALATION_SPRAY | CUTANEOUS | Status: DC | PRN

## 2017-07-21 MED ORDER — HEPARIN SODIUM (PORCINE) 1000 UNIT/ML DIALYSIS
20.0000 [IU]/kg | INTRAMUSCULAR | Status: DC | PRN
  Administered 2017-07-21: 1800 [IU] via INTRAVENOUS_CENTRAL
  Filled 2017-07-21: qty 2

## 2017-07-21 MED ORDER — LIDOCAINE HCL (PF) 1 % IJ SOLN: 5.0000 mL | INTRAMUSCULAR | Status: DC | PRN

## 2017-07-21 MED ORDER — OXYCODONE HCL 5 MG PO TABS
ORAL_TABLET | ORAL | Status: AC
  Filled 2017-07-21: qty 1

## 2017-07-21 MED ORDER — SODIUM CHLORIDE 0.9 % IV SOLN: 100.0000 mL | INTRAVENOUS | Status: DC | PRN

## 2017-07-21 MED ORDER — MIDODRINE HCL 5 MG PO TABS
ORAL_TABLET | ORAL | Status: AC
  Administered 2017-07-21: 5 mg via ORAL
  Filled 2017-07-21: qty 1

## 2017-07-21 NOTE — Progress Notes (Signed)
ANTICOAGULATION CONSULT NOTE - Follow Up Consult  Pharmacy Consult for Heparin and Warfarin Indication: atrial fibrillation  No Known Allergies  Labs:  Recent Labs  07/19/17 0532 07/20/17 0339 07/21/17 0623  HGB 8.3* 8.5* 8.6*  HCT 26.3* 26.5* 27.5*  PLT 153 164 173  LABPROT 17.4* 21.9* 25.2*  INR 1.44 1.93 2.31  HEPARINUNFRC 0.35 0.45 0.43  CREATININE 3.87* 4.75* 3.73*    Estimated Creatinine Clearance: 22.5 mL/min (A) (by C-G formula based on SCr of 3.73 mg/dL (H)).   Medications: Heparin @ 1700 units/hr  Assessment: Pt on warfarin PTA for afib/MVR (bioprosthetic). Warfarin held pending HD access placement and he was started on a heparin bridge. S/P DCCV for Afib 8/23 but back in afib on amiodarone. Underwent fistula and TDC placement 8/27. Heparin and warfarin resumed s/p EGD and colonoscopy 8/30. Heparin level therapeutic today 0.43 but will discontinue as INR is finally at goal 2.31. Given fast rate of rise will give smaller dose tonight. Hgb low but stable.  PTA warfarin dose was 2.5 mg daily  Goal of Therapy:  INR 2-3 Heparin level 0.3-0.5 Monitor platelets by anticoagulation protocol: Yes   Plan:  1) Stop heparin 2) Warfarin 1mg  tonight 3) Daily INR, CBC  Nena Jordan, PharmD, BCPS 07/21/2017 2:04 PM

## 2017-07-21 NOTE — Progress Notes (Signed)
AV fistula incision site leaking with serous fluid, dressing applied earlier this shift. I rechecked site noted that dressing was changed with kleenex.Per patient he change it because it was soaked. Patient was educated of the risk of infection.

## 2017-07-21 NOTE — Progress Notes (Signed)
Patient ID: Michael [name], male   DOB: 09/16/1961, 56 y.o.   MRN: [id number]      Advanced Heart Failure Rounding Note  Primary Cardiologist: Dr. Harrington Challenger HF Cardiology: Aundra Dubin  Subjective:    Started CVVHD 8/15 and stopped milrinone. CVVH stopped 8/21. Currently getting iHD.  Tolerating so far though BP drops.   On 8/23, he had TEE-guided DCCV to NSR.  Reverted back to AF 8/24  He had CT abdomen/pelvis 8/27: no retroperitoneal hematoma or other cause of fall in hemoglobin or back pain.   8/30 S/P EGD and Colonoscopy. 2 polyps removed.   Feeling OK this am. Back pain improved. Walked halls yesterday without SOB or dizziness.   INR 2.31. Renains in AF with controlled HR  Studies: Echo (7/18): EF 50-55%, mild LVH, mild AI, bioprosthetic mitral valve appeared to function normally, RV moderately dilated with moderately decreased systolic function, D-shaped interventricular septum suggestive of RV pressure/volume overload, severe TR, PASP 54 mmHg  RHC Procedural Findings: Hemodynamics (mmHg) RA mean 27 RV 63/8, mean 20 PA 58/28, mean 40 PCWP mean 28 Oxygen saturations: PA 69% AO 96% Cardiac Output (Fick) 9.82  Cardiac Index (Fick) 4.96 PVR 1.2 WU  TEE (8/23): Normal LV size and systolic function, EF 32-67%.  Normal wall thickness and motion.  D-shaped interventricular septum suggestive of RV pressure/volume overload.  The RV was severely dilated with moderately decreased systolic function.  Severe LAE, no thrombus seen.  The LA appendage appears to have been ligated.  Moderate right atrial enlargement.  No PFO/ASD by color doppler.  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  Peak RV-RA gradient 25 mmHg.  Status post mitral valve replacement with bioprosthetic valve.  Trivial MR.  Mean gradient 6 mmHg across valve, does not appear to have significant stenosis.  Trileaflet aortic valve with moderate aortic regurgitation, no significant stenosis.  Normal caliber aorta with  grade III plaque in descending thoracic aorta.   Objective:   Weight Range: 193 lb (87.5 kg) Body mass index is 32.12 kg/m.   Vital Signs:   Temp:  [97.8 F (36.6 C)-98.2 F (36.8 C)] 98 F (36.7 C) (09/04 0546) Pulse Rate:  [67-93] 70 (09/04 0546) Resp:  [15-18] 18 (09/04 0546) BP: (99-117)/(58-76) 105/61 (09/04 0546) SpO2:  [95 %-98 %] 96 % (09/04 0546) Weight:  [188 lb 15 oz (85.7 kg)-193 lb (87.5 kg)] 193 lb (87.5 kg) (09/04 0546) Last BM Date: 07/20/17  Weight change: Filed Weights   07/20/17 0740 07/20/17 1150 07/21/17 0546  Weight: 195 lb 8.8 oz (88.7 kg) 188 lb 15 oz (85.7 kg) 193 lb (87.5 kg)    Intake/Output:   Intake/Output Summary (Last 24 hours) at 07/21/17 0813 Last data filed at 07/21/17 0600  Gross per 24 hour  Intake             1359 ml  Output             3000 ml  Net            -1641 ml      Physical Exam   General: NAD  HEENT: JVP 11-12 cm Neck: Supple. JVP 5-6. Carotids 2+ bilat; no bruits. No thyromegaly or nodule noted. Cor: PMI nondisplaced. RRR, No M/G/R noted Lungs: CTAB, normal effort. Abdomen: Soft, non-tender, non-distended, no HSM. No bruits or masses. +BS  Extremities: No cyanosis, clubbing, or rash. Trace to 1+ edema to knees.   Neuro: Alert & orientedx3, cranial nerves grossly intact. moves all 4  extremities w/o difficulty. Affect pleasant   Telemetry   Afib 80-90s, Personally reviewed.   Labs    CBC  Recent Labs  07/20/17 0339 07/21/17 0623  WBC 7.3 7.9  HGB 8.5* 8.6*  HCT 26.5* 27.5*  MCV 83.9 84.9  PLT 164 169   Basic Metabolic Panel  Recent Labs  07/20/17 0339 07/21/17 0623  NA 131* 132*  K 4.6 4.3  CL 95* 96*  CO2 27 21*  GLUCOSE 95 116*  BUN 64* 48*  CREATININE 4.75* 3.73*  CALCIUM 9.0 8.9   Liver Function Tests No results for input(s): AST, ALT, ALKPHOS, BILITOT, PROT, ALBUMIN in the last 72 hours. No results for input(s): LIPASE, AMYLASE in the last 72 hours. Cardiac Enzymes No results for  input(s): CKTOTAL, CKMB, CKMBINDEX, TROPONINI in the last 72 hours.  BNP: BNP (last 3 results)  Recent Labs  10/05/16 0147 05/30/17 1208 06/25/17 1253  BNP 237.4* 1,404.9* 875.7*    ProBNP (last 3 results)  Recent Labs  03/16/17 1401  PROBNP 6,530*   Imaging   Transthoracic Echocardiography 06/01/17  Study Conclusions  - Left ventricle: The cavity size was normal. There was mild   concentric hypertrophy. Systolic function was normal. The   estimated ejection fraction was in the range of 50% to 55%. Wall   motion was normal; there were no regional wall motion   abnormalities. - Ventricular septum: The contour showed diastolic flattening. - Aortic valve: Transvalvular velocity was minimally increased.   There was no stenosis. There was mild regurgitation. Peak   velocity (S): 201 cm/s. - Mitral valve: A bioprosthesis was present and functioning   normally. Mean gradient (D): 6 mm Hg. Valve area by pressure   half-time: 1.93 cm^2. - Left atrium: The atrium was severely dilated. - Right ventricle: The cavity size was moderately dilated. Wall   thickness was normal. Systolic function was moderately reduced. - Right atrium: The atrium was moderately dilated. - Tricuspid valve: There was severe regurgitation. - Pulmonary arteries: Systolic pressure was moderately increased.   PA peak pressure: 54 mm Hg (S).   RIGHT HEART CATH 06/29/17 RA mean 27 RV 63/8, mean 20 PA 58/28, mean 40 PCWP mean 28  Oxygen saturations: PA 69% AO 96%  Cardiac Output (Fick) 9.82  Cardiac Index (Fick) 4.96 PVR 1.2 WU     Medications:     Scheduled Medications: . atorvastatin  20 mg Oral Daily  . calcium acetate  667 mg Oral TID WC  . darbepoetin (ARANESP) injection - NON-DIALYSIS  200 mcg Subcutaneous Q Sat-1800  . docusate sodium  100 mg Oral BID  . feeding supplement (NEPRO CARB STEADY)  237 mL Oral q morning - 10a  . heparin  40 Units/kg Dialysis Once in dialysis  . insulin  aspart  0-5 Units Subcutaneous QHS  . insulin aspart  0-9 Units Subcutaneous TID WC  . metoCLOPramide (REGLAN) injection  10 mg Intravenous Once  . metoprolol succinate  25 mg Oral BID  . midodrine  5 mg Oral TID WC  . pantoprazole  40 mg Oral Daily  . polyethylene glycol  17 g Oral Daily  . predniSONE  20 mg Oral Q breakfast  . sodium chloride  2 spray Each Nare BID  . sodium chloride flush  3 mL Intravenous Q12H  . Warfarin - Pharmacist Dosing Inpatient   Does not apply q1800    Infusions: . sodium chloride 0  (07/01/17 1500)  . sodium chloride Stopped (07/06/17 0814)  .  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 Patient underwent mitral valve replacement and tricuspid valve repair in [place] in 2012.    Assessment/Plan   1. Acute on chronic diastolic CHF/RV failure: EF 50-55% on echo in 7/18 with prominent RV failure (enlarged/hypokinetic RV with D-shaped interventricular septum), severe TR.  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  Milrinone was started at admission for RV support, stopped now that he is getting RRT.  CVVH stopped 8/21 and has been getting regular iHD since then.   - Accepted at [facility] for outpatient Mon/Wed/Friday  - Additional timing per renal. They requested he stay yesterday for additional volume removal. Will be  getting HD daily.  2. AKI on CKD stage IV:  - Now dialysis dependent.  - Significant RV failure will complicate this but he has done well with it so far.  He lives alone and doesn't have much help with his healthcare so chance at heart-kidney transplant would be very low.  - VVS has seen. He had tunneled catheter and AV fistula 07/13/17.  - Tolerating HD so far.  3. Atrial fibrillation: Persistent.  Has been in atrial fibrillation for a number of months. S/p TEE-guided DCCV on 8/23.  Back in afib on 8/24 and has stayed in afib. HR better controlled at this point.  - Would not repeat DCCV.   - Continue Toprol XL 25 mg bid, now off amiodarone.    - INR 2.31. Dosing per pharm.  4. Anemia: He has had BRBPR but not in the last few days.  Transfusion this admission. GI appreciated. S/P EGD/Colonoscopy 8/30 with a 2 polyps removed and internal hemorrhoids. - Received PRBC on 8/30 for hgb 6.6. Todays hgb 8.6.    5. Bioprosthetic mitral valve: Stable appearance on 7/18 echo.  No change.  6. Probable sleep apnea: Will need sleep study outpatient. No change.  7. Acute gout in R foot: Prednisone 8/25 x 3 days. Improved.  8. Low back pain: Ongoing with slow improvement.  CT did not show RP hematoma or other cause for back pain. Colonoscopy was OK.  9. Disposition:  - Will follow up with Dr. Harrington Challenger.  - Will need coumadin follow up.   OK for home once volume status controlled per renal.    Length of Stay: [place], PA-C  07/21/2017, 8:13 AM  Advanced Heart Failure Team Pager [phone] (M-F; 7a - 4p)  Please contact [facility] for night-coverage after hours (4p -7a ) and weekends on [email or web address]  Patient seen with PA, agree with the above note.  HR stable in 80s on Toprol XL.  INR therapeutic.   He remains volume overloaded.  Will have daily HD.  Midodrine 5 mg tid started with low BP at HD.  He will go home when volume better controlled.   [name] 07/21/2017 8:32 AM

## 2017-07-21 NOTE — Procedures (Signed)
Stable bp but soft.  Marked vol excess. Eating and drinking a lot per RN.  Will stop steroids he is taking for gout. Michael [name] C

## 2017-07-21 NOTE — Anesthesia Postprocedure Evaluation (Signed)
Anesthesia Post Note  Patient: Leverne Halleck  Procedure(s) Performed: Procedure(s) (LRB): COLONOSCOPY (N/A) ESOPHAGOGASTRODUODENOSCOPY (EGD) (N/A)     Patient location during evaluation: Endoscopy Anesthesia Type: MAC Level of consciousness: awake and patient cooperative Pain management: pain level controlled Vital Signs Assessment: post-procedure vital signs reviewed and stable Respiratory status: spontaneous breathing, nonlabored ventilation, respiratory function stable and patient connected to nasal cannula oxygen Cardiovascular status: stable and blood pressure returned to baseline Anesthetic complications: no    Last Vitals:  Vitals:   07/20/17 2016 07/21/17 0546  BP: 117/67 105/61  Pulse: 93 70  Resp: 18 18  Temp: 36.6 C 36.7 C  SpO2: 97% 96%    Last Pain:  Vitals:   07/21/17 0546  TempSrc: Oral  PainSc:                  Etta Gassett

## 2017-07-21 NOTE — Clinical Social Work Note (Signed)
Per CSW covering on Friday, she spoke with patient's daughter and gave her [name] contact information to call and set up interview appointment. She encouraged her to do so as soon as possible.  CSW signing off. Consult again if any other social work needs arise.  Dayton Scrape, [place]

## 2017-07-21 NOTE — Progress Notes (Signed)
Physical Therapy Cancellation Note  Pt in HD. PT will check on pt later as time allows.    07/21/17 1228  PT Visit Information  Last PT Received On 07/21/17  Reason Eval/Treat Not Completed Patient at procedure or test/unavailable   Earney Navy, PTA Pager: [phone]

## 2017-07-22 ENCOUNTER — Other Ambulatory Visit: Payer: Self-pay | Admitting: *Deleted

## 2017-07-22 LAB — CBC
HCT: 28.2 % — ABNORMAL LOW (ref 39.0–52.0)
Hemoglobin: 8.8 g/dL — ABNORMAL LOW (ref 13.0–17.0)
MCH: 26.7 pg (ref 26.0–34.0)
MCHC: 31.2 g/dL (ref 30.0–36.0)
MCV: 85.7 fL (ref 78.0–100.0)
PLATELETS: 183 10*3/uL (ref 150–400)
RBC: 3.29 MIL/uL — AB (ref 4.22–5.81)
RDW: 22.7 % — ABNORMAL HIGH (ref 11.5–15.5)
WBC: 8.5 10*3/uL (ref 4.0–10.5)

## 2017-07-22 LAB — BASIC METABOLIC PANEL
Anion gap: 11 (ref 5–15)
BUN: 32 mg/dL — ABNORMAL HIGH (ref 6–20)
CHLORIDE: 96 mmol/L — AB (ref 101–111)
CO2: 26 mmol/L (ref 22–32)
CREATININE: 2.96 mg/dL — AB (ref 0.61–1.24)
Calcium: 8.7 mg/dL — ABNORMAL LOW (ref 8.9–10.3)
GFR, EST AFRICAN AMERICAN: 25 mL/min — AB (ref >60)
GFR, EST NON AFRICAN AMERICAN: 22 mL/min — AB (ref >60)
Glucose, Bld: 90 mg/dL (ref 65–99)
Potassium: 4.1 mmol/L (ref 3.5–5.1)
SODIUM: 133 mmol/L — AB (ref 135–145)

## 2017-07-22 LAB — PROTIME-INR
INR: 2.39
Prothrombin Time: 25.9 seconds — ABNORMAL HIGH (ref 11.4–15.2)

## 2017-07-22 LAB — GLUCOSE, CAPILLARY
GLUCOSE-CAPILLARY: 67 mg/dL (ref 65–99)
GLUCOSE-CAPILLARY: 109 mg/dL — AB (ref 65–99)
Glucose-Capillary: 112 mg/dL — ABNORMAL HIGH (ref 65–99)

## 2017-07-22 MED ORDER — ALTEPLASE 2 MG IJ SOLR: 2.0000 mg | Freq: Once | INTRAMUSCULAR | Status: DC | PRN

## 2017-07-22 MED ORDER — LIDOCAINE HCL (PF) 1 % IJ SOLN: 5.0000 mL | INTRAMUSCULAR | Status: DC | PRN

## 2017-07-22 MED ORDER — MIDODRINE HCL 5 MG PO TABS
ORAL_TABLET | ORAL | Status: AC
  Filled 2017-07-22: qty 1

## 2017-07-22 MED ORDER — HEPARIN SODIUM (PORCINE) 1000 UNIT/ML DIALYSIS: 20.0000 [IU]/kg | INTRAMUSCULAR | Status: DC | PRN

## 2017-07-22 MED ORDER — LIDOCAINE-PRILOCAINE 2.5-2.5 % EX CREA: 1.0000 "application " | TOPICAL_CREAM | CUTANEOUS | Status: DC | PRN

## 2017-07-22 MED ORDER — HEPARIN SODIUM (PORCINE) 1000 UNIT/ML DIALYSIS: 1000.0000 [IU] | INTRAMUSCULAR | Status: DC | PRN

## 2017-07-22 MED ORDER — PENTAFLUOROPROP-TETRAFLUOROETH EX AERO: 1.0000 "application " | INHALATION_SPRAY | CUTANEOUS | Status: DC | PRN

## 2017-07-22 MED ORDER — SODIUM CHLORIDE 0.9 % IV SOLN: 100.0000 mL | INTRAVENOUS | Status: DC | PRN

## 2017-07-22 MED ORDER — WARFARIN SODIUM 2 MG PO TABS
1.0000 mg | ORAL_TABLET | Freq: Once | ORAL | Status: AC
Start: 2017-07-22 — End: 2017-07-22
  Administered 2017-07-22: 1 mg via ORAL
  Filled 2017-07-22: qty 1

## 2017-07-22 NOTE — Progress Notes (Signed)
Patient ID: Michael [name], male   DOB: 26-Nov-1960, 56 y.o.   MRN: [id number]      Advanced Heart Failure Rounding Note  Primary Cardiologist: Dr. Harrington Challenger HF Cardiology: Aundra Dubin  Subjective:    Started CVVHD 8/15 and stopped milrinone. CVVH stopped 8/21. On midodrine with stable BP now.  With ongoing volume overload, getting daily HD.   On 8/23, he had TEE-guided DCCV to NSR.  Reverted back to AF 8/24  He had CT abdomen/pelvis 8/27: no retroperitoneal hematoma or other cause of fall in hemoglobin or back pain.   8/30 S/P EGD and Colonoscopy. 2 polyps removed.   Feeling OK this am. Back pain improved. Able to walk in hall.   INR 2.39. Renains in AF with controlled HR.   Studies: Echo (7/18): EF 50-55%, mild LVH, mild AI, bioprosthetic mitral valve appeared to function normally, RV moderately dilated with moderately decreased systolic function, D-shaped interventricular septum suggestive of RV pressure/volume overload, severe TR, PASP 54 mmHg  RHC Procedural Findings: Hemodynamics (mmHg) RA mean 27 RV 63/8, mean 20 PA 58/28, mean 40 PCWP mean 28 Oxygen saturations: PA 69% AO 96% Cardiac Output (Fick) 9.82  Cardiac Index (Fick) 4.96 PVR 1.2 WU  TEE (8/23): Normal LV size and systolic function, EF 30-86%.  Normal wall thickness and motion.  D-shaped interventricular septum suggestive of RV pressure/volume overload.  The RV was severely dilated with moderately decreased systolic function.  Severe LAE, no thrombus seen.  The LA appendage appears to have been ligated.  Moderate right atrial enlargement.  No PFO/ASD by color doppler.  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  Peak RV-RA gradient 25 mmHg.  Status post mitral valve replacement with bioprosthetic valve.  Trivial MR.  Mean gradient 6 mmHg across valve, does not appear to have significant stenosis.  Trileaflet aortic valve with moderate aortic regurgitation, no significant stenosis.  Normal caliber aorta with  grade III plaque in descending thoracic aorta.   Objective:   Weight Range: 191 lb 12.8 oz (87 kg) Body mass index is 31.92 kg/m.   Vital Signs:   Temp:  [97.9 F (36.6 C)-98.5 F (36.9 C)] 97.9 F (36.6 C) (09/05 0655) Pulse Rate:  [57-94] 73 (09/05 0830) Resp:  [13-18] 13 (09/05 0705) BP: (99-125)/(51-81) 115/64 (09/05 0830) SpO2:  [96 %-100 %] 98 % (09/05 0655) Weight:  [190 lb 11.2 oz (86.5 kg)-197 lb 1.5 oz (89.4 kg)] 191 lb 12.8 oz (87 kg) (09/05 0655) Last BM Date: 07/21/17  Weight change: Filed Weights   07/21/17 1506 07/22/17 0609 07/22/17 0655  Weight: 190 lb 11.2 oz (86.5 kg) 190 lb 11.2 oz (86.5 kg) 191 lb 12.8 oz (87 kg)    Intake/Output:   Intake/Output Summary (Last 24 hours) at 07/22/17 0852 Last data filed at 07/21/17 2100  Gross per 24 hour  Intake              790 ml  Output             3000 ml  Net            -2210 ml      Physical Exam   General: NAD Neck: JVP 12-14, no thyromegaly or thyroid nodule.  Lungs: Clear to auscultation bilaterally with normal respiratory effort. CV: Nondisplaced PMI.  Heart irregular S1/S2, no S3/S4, no murmur.  2+ edema to thighs.   Abdomen: Soft, nontender, no hepatosplenomegaly, no distention.  Skin: Intact without lesions or rashes.  Neurologic: Alert and oriented  x 3.  Psych: Normal affect. Extremities: No clubbing or cyanosis.  HEENT: Normal.    Telemetry   Afib 80s, Personally reviewed.   Labs    CBC  Recent Labs  07/21/17 0623 07/22/17 0546  WBC 7.9 8.5  HGB 8.6* 8.8*  HCT 27.5* 28.2*  MCV 84.9 85.7  PLT 173 024   Basic Metabolic Panel  Recent Labs  07/21/17 0623 07/22/17 0546  NA 132* 133*  K 4.3 4.1  CL 96* 96*  CO2 21* 26  GLUCOSE 116* 90  BUN 48* 32*  CREATININE 3.73* 2.96*  CALCIUM 8.9 8.7*   Liver Function Tests No results for input(s): AST, ALT, ALKPHOS, BILITOT, PROT, ALBUMIN in the last 72 hours. No results for input(s): LIPASE, AMYLASE in the last 72 hours. Cardiac  Enzymes No results for input(s): CKTOTAL, CKMB, CKMBINDEX, TROPONINI in the last 72 hours.  BNP: BNP (last 3 results)  Recent Labs  10/05/16 0147 05/30/17 1208 06/25/17 1253  BNP 237.4* 1,404.9* 875.7*    ProBNP (last 3 results)  Recent Labs  03/16/17 1401  PROBNP 6,530*   Imaging   Transthoracic Echocardiography 06/01/17  Study Conclusions  - Left ventricle: The cavity size was normal. There was mild   concentric hypertrophy. Systolic function was normal. The   estimated ejection fraction was in the range of 50% to 55%. Wall   motion was normal; there were no regional wall motion   abnormalities. - Ventricular septum: The contour showed diastolic flattening. - Aortic valve: Transvalvular velocity was minimally increased.   There was no stenosis. There was mild regurgitation. Peak   velocity (S): 201 cm/s. - Mitral valve: A bioprosthesis was present and functioning   normally. Mean gradient (D): 6 mm Hg. Valve area by pressure   half-time: 1.93 cm^2. - Left atrium: The atrium was severely dilated. - Right ventricle: The cavity size was moderately dilated. Wall   thickness was normal. Systolic function was moderately reduced. - Right atrium: The atrium was moderately dilated. - Tricuspid valve: There was severe regurgitation. - Pulmonary arteries: Systolic pressure was moderately increased.   PA peak pressure: 54 mm Hg (S).   RIGHT HEART CATH 06/29/17 RA mean 27 RV 63/8, mean 20 PA 58/28, mean 40 PCWP mean 28  Oxygen saturations: PA 69% AO 96%  Cardiac Output (Fick) 9.82  Cardiac Index (Fick) 4.96 PVR 1.2 WU     Medications:     Scheduled Medications: . atorvastatin  20 mg Oral Daily  . calcium acetate  667 mg Oral TID WC  . darbepoetin (ARANESP) injection - NON-DIALYSIS  200 mcg Subcutaneous Q Sat-1800  . docusate sodium  100 mg Oral BID  . feeding supplement (NEPRO CARB STEADY)  237 mL Oral q morning - 10a  . heparin  40 Units/kg Dialysis Once  in dialysis  . insulin aspart  0-5 Units Subcutaneous QHS  . insulin aspart  0-9 Units Subcutaneous TID WC  . metoCLOPramide (REGLAN) injection  10 mg Intravenous Once  . metoprolol succinate  25 mg Oral BID  . midodrine  5 mg Oral TID WC  . pantoprazole  40 mg Oral Daily  . polyethylene glycol  17 g Oral Daily  . sodium chloride  2 spray Each Nare BID  . sodium chloride flush  3 mL Intravenous Q12H  . Warfarin - Pharmacist Dosing Inpatient   Does not apply q1800    Infusions: . sodium chloride 0  (07/01/17 1500)  . sodium chloride Stopped (07/06/17 0814)  .  sodium chloride    . sodium chloride    . 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 Patient underwent mitral valve replacement and tricuspid valve repair in [place] in 2012.    Assessment/Plan   1. Acute on chronic diastolic CHF/RV failure: EF 50-55% on echo in 7/18 with prominent RV failure (enlarged/hypokinetic RV with D-shaped interventricular septum), severe TR.  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  Milrinone was started at admission for RV support, stopped now that he is getting RRT.  CVVH stopped 8/21 and has been getting regular iHD since then.  He remains volume  overloaded.  - Accepted at [facility] for outpatient Mon/Wed/Friday  - Now getting daily HD.  Weight not coming down much, need to make sure he is fluid restricted and sodium restricted.  He may end up needing CVVH again to get the volume off.   2. AKI on CKD stage IV: Now dialysis dependent. Significant RV failure will complicate this but he has done well with it so far.  He lives alone and doesn't have much help with his healthcare so chance at heart-kidney transplant would be very low. [facility] has seen. He had tunneled catheter and AV fistula 07/13/17.  - Tolerating HD so far, on midodrine to keep BP up.  3. Atrial fibrillation: Persistent.  Has been in atrial fibrillation for a number of months. S/p TEE-guided DCCV on 8/23.  Back in afib on 8/24 and has stayed in afib. HR better controlled at this point.  - Would not repeat DCCV.   - Continue Toprol XL 25 mg bid, now off amiodarone.    - INR 2.39. Dosing per pharm.  4. Anemia: He has had BRBPR but not in the last few days.  Transfusion this admission. GI appreciated. S/P EGD/Colonoscopy 8/30 with a 2 polyps removed and internal hemorrhoids. - Received PRBC on 8/30 for hgb 6.6. Todays hgb 8.8.  Continue warfarin.  5. Bioprosthetic mitral valve: Stable appearance on 7/18 echo.  No change.  6. Probable sleep apnea: Will need sleep study outpatient. No change.  7. Acute gout in R foot: Improved, stop prednisone.   8. Low back pain: Ongoing with slow improvement.  CT did not show RP hematoma or other cause for back pain. Colonoscopy was OK.  9. Disposition:  - Will follow up with Dr. Harrington Challenger.  - Will need coumadin follow up.   OK for home once he has enough fluid off.    Length of Stay: 25  [name], MD  07/22/2017, 8:52 AM  Advanced Heart Failure Team Pager [phone] (M-F; 7a - 4p)  Please contact [facility] for night-coverage after hours (4p -7a ) and weekends on [email or web address]

## 2017-07-22 NOTE — Progress Notes (Signed)
Physical Therapy Treatment Patient Details Name: Michael [name] MRN: [id number] DOB: October 27, 1961 Today's Date: 07/22/2017    History of Present Illness HPI: Michael [name] is a 56 y.o. male with medical history significant of anemia of chronic disease, chronic kidney disease, systolic heart failure, diabetes mellitus, GERD, history of tricuspid valve repair, essential hypertension, atrial fibrillation, pulmonary hypertension. Patient presented from his PCPs office for evaluation of anasarca. Acute kidney injury as well with cardiorenal syndrome. Pt currently on CRRT (began 8/14).     PT Comments    Patient demonstrated improved mobility this session as he has no c/o pain back today. Pt tolerated gait and stair training without AD and min A to steady. Pt fatigued end of session. All vitals WNL. Current plan remains appropriate.    Follow Up Recommendations  Home health PT     Equipment Recommendations  Rolling walker with 5" wheels    Recommendations for Other Services       Precautions / Restrictions Precautions Precautions: Fall    Mobility  Bed Mobility               General bed mobility comments: pt OOB in chair upon arrival  Transfers Overall transfer level: Needs assistance Equipment used: None Transfers: Sit to/from Stand Sit to Stand: Supervision         General transfer comment: supervision for safety  Ambulation/Gait Ambulation/Gait assistance: Min assist Ambulation Distance (Feet): 240 Feet Assistive device: None Gait Pattern/deviations: Step-through pattern;Decreased stride length;Trunk flexed;Drifts right/left Gait velocity: decreased    General Gait Details: cues for posture   Stairs Stairs: Yes   Stair Management: Two rails;Forwards;Step to pattern Number of Stairs: 10 General stair comments: cues for sequencing and min A to steady  Wheelchair Mobility    Modified Rankin (Stroke Patients Only)       Balance Overall balance  assessment: Needs assistance Sitting-balance support: No upper extremity supported;Feet supported Sitting balance-Leahy Scale: Good     Standing balance support: During functional activity;Bilateral upper extremity supported Standing balance-Leahy Scale: Fair                              Cognition Arousal/Alertness: Awake/alert Behavior During Therapy: WFL for tasks assessed/performed Overall Cognitive Status: Within Functional Limits for tasks assessed                                        Exercises      General Comments General comments (skin integrity, edema, etc.): all vitals WNL      Pertinent Vitals/Pain Pain Assessment: No/denies pain    Home Living                      Prior Function            PT Goals (current goals can now be found in the care plan section) Acute Rehab PT Goals PT Goal Formulation: With patient Time For Goal Achievement: 07/16/17 Potential to Achieve Goals: Good Progress towards PT goals: Progressing toward goals    Frequency    Min 3X/week      PT Plan Current plan remains appropriate    Co-evaluation              AM-PAC PT "6 Clicks" Daily Activity  Outcome Measure  Difficulty turning over in bed (including adjusting bedclothes, sheets and blankets)?:  None Difficulty moving from lying on back to sitting on the side of the bed? : None Difficulty sitting down on and standing up from a chair with arms (e.g., wheelchair, bedside commode, etc,.)?: A Lot Help needed moving to and from a bed to chair (including a wheelchair)?: A Little Help needed walking in hospital room?: A Little Help needed climbing 3-5 steps with a railing? : A Little 6 Click Score: 19    End of Session Equipment Utilized During Treatment: Gait belt Activity Tolerance: Patient tolerated treatment well Patient left: with call bell/phone within reach;in chair Nurse Communication: Mobility status PT Visit Diagnosis:  Unsteadiness on feet (R26.81);Muscle weakness (generalized) (M62.81)     Time: 5183-3582 PT Time Calculation (min) (ACUTE ONLY): 24 min  Charges:  $Gait Training: 23-37 mins                    G Codes:       Earney Navy, PTA Pager: [phone]     Darliss Cheney 07/22/2017, 4:21 PM

## 2017-07-22 NOTE — Patient Outreach (Signed)
Michael [name]) Care Management  07/22/2017  Michael [name] [birth year] [id number]   CSW noted that patient continues to remain hospitalized since receiving the referral for services on August 29th.  CSW received a request to assist patient with transportation to and from his physician appointments.  However, CSW is aware that Michael [name], [facility] Social Worker, was able to assist patient with completion of a [name]) application, submitting it directly to [employer]) for processing. CSW was able to make contact with a representative from [name] to ensure that patient has been approved for transportation services.  Patient is not of age to qualify for Senior Wheels through [employer] [facility], so no need for CSW to place a referral for services. CSW will perform a case closure on patient, as all goals of treatment have been met from social work standpoint and no additional social work needs have been identified at this time.  CSW will notify patient's RNCM with [facility] [employer], Michael [name] of CSW's plans to close patient's case.  CSW will fax an update to patient's Primary Care Physician, [name] to ensure that they are aware of CSW's involvement with patient's plan of care.  CSW will submit a case closure request to Michael [name], Care Management Assistant with [facility] [employer], in the form of an [employer].  CSW will ensure that Michael [name] is aware of Michael [name]'s, RNCM with [employer], continued involvement with patient's care. Michael [name], BSW, MSW, LCSW  Licensed Education officer, [facility]  Mailing [place] [address] [place], [place] [address] Physical Address-[address], [place], [place] [address] Toll Free Main #  [phone] Fax # [phone] Cell # [phone]  Office # [phone] [email or web address]

## 2017-07-22 NOTE — Procedures (Signed)
Tol HD so far, BP stable, remains markedly overloaded; we may do better with CRRT.  Was -3000cc yesterday. Will increase time and goal.

## 2017-07-22 NOTE — Progress Notes (Signed)
Pt to walk with PT. Will f/u tomorrow. Yves Dill CES, ACSM 2:06 PM 07/22/2017

## 2017-07-22 NOTE — Progress Notes (Signed)
ANTICOAGULATION CONSULT NOTE - Follow Up Consult  Pharmacy Consult for Warfarin Indication: atrial fibrillation  No Known Allergies  Labs:  Recent Labs  07/20/17 0339 07/21/17 0623 07/22/17 0546  HGB 8.5* 8.6* 8.8*  HCT 26.5* 27.5* 28.2*  PLT 164 173 183  LABPROT 21.9* 25.2* 25.9*  INR 1.93 2.31 2.39  HEPARINUNFRC 0.45 0.43  --   CREATININE 4.75* 3.73* 2.96*    Estimated Creatinine Clearance: 27.9 mL/min (A) (by C-G formula based on SCr of 2.96 mg/dL (H)).   Assessment: Pt on warfarin PTA for afib/MVR (bioprosthetic). Warfarin held pending HD access placement and he was started on a heparin bridge. S/P DCCV for Afib 8/23 but back in afib on amiodarone. Underwent fistula and TDC placement 8/27. Heparin and warfarin resumed s/p EGD and colonoscopy 8/30. Heparin stopped yesterday with therapeutic INR. INR remains at goal today 2.39. Amiodarone discontinued 9/2. CBC stable.   Noted that he may need CRRT - will follow up anticoagulation plan if this happens.  PTA warfarin dose was 2.5 mg daily  Goal of Therapy:  INR 2-3 Monitor platelets by anticoagulation protocol: Yes   Plan:  1) Repeat warfarin 1mg  tonight 3) Daily INR, CBC  Nena Jordan, PharmD, BCPS 07/22/2017 11:14 AM

## 2017-07-23 ENCOUNTER — Ambulatory Visit: Payer: Self-pay | Admitting: *Deleted

## 2017-07-23 LAB — GLUCOSE, CAPILLARY
GLUCOSE-CAPILLARY: 263 mg/dL — AB (ref 65–99)
Glucose-Capillary: 73 mg/dL (ref 65–99)
Glucose-Capillary: 106 mg/dL — ABNORMAL HIGH (ref 65–99)

## 2017-07-23 LAB — CBC
HCT: 30.5 % — ABNORMAL LOW (ref 39.0–52.0)
Hemoglobin: 9.5 g/dL — ABNORMAL LOW (ref 13.0–17.0)
MCH: 27.6 pg (ref 26.0–34.0)
MCHC: 31.1 g/dL (ref 30.0–36.0)
MCV: 88.7 fL (ref 78.0–100.0)
PLATELETS: 179 10*3/uL (ref 150–400)
RBC: 3.44 MIL/uL — AB (ref 4.22–5.81)
RDW: 23.6 % — AB (ref 11.5–15.5)
WBC: 6.4 10*3/uL (ref 4.0–10.5)

## 2017-07-23 LAB — BASIC METABOLIC PANEL
Anion gap: 9 (ref 5–15)
BUN: 24 mg/dL — AB (ref 6–20)
CALCIUM: 8.7 mg/dL — AB (ref 8.9–10.3)
CO2: 29 mmol/L (ref 22–32)
CREATININE: 2.71 mg/dL — AB (ref 0.61–1.24)
Chloride: 96 mmol/L — ABNORMAL LOW (ref 101–111)
GFR, EST AFRICAN AMERICAN: 28 mL/min — AB (ref >60)
GFR, EST NON AFRICAN AMERICAN: 24 mL/min — AB (ref >60)
Glucose, Bld: 80 mg/dL (ref 65–99)
Potassium: 4 mmol/L (ref 3.5–5.1)
SODIUM: 134 mmol/L — AB (ref 135–145)

## 2017-07-23 LAB — PROTIME-INR
INR: 2.03
PROTHROMBIN TIME: 22.8 s — AB (ref 11.4–15.2)

## 2017-07-23 MED ORDER — MIDODRINE HCL 5 MG PO TABS
ORAL_TABLET | ORAL | Status: AC
  Administered 2017-07-23: 5 mg
  Filled 2017-07-23: qty 1

## 2017-07-23 MED ORDER — MIDODRINE HCL 5 MG PO TABS
ORAL_TABLET | ORAL | Status: AC
  Administered 2017-07-23: 5 mg via ORAL
  Filled 2017-07-23: qty 1

## 2017-07-23 MED ORDER — ALTEPLASE 2 MG IJ SOLR: 2.0000 mg | Freq: Once | INTRAMUSCULAR | Status: DC | PRN

## 2017-07-23 MED ORDER — WARFARIN SODIUM 2.5 MG PO TABS
2.5000 mg | ORAL_TABLET | Freq: Once | ORAL | Status: AC
  Administered 2017-07-23: 2.5 mg via ORAL
  Filled 2017-07-23: qty 1

## 2017-07-23 MED ORDER — HEPARIN SODIUM (PORCINE) 1000 UNIT/ML DIALYSIS: 1000.0000 [IU] | INTRAMUSCULAR | Status: DC | PRN

## 2017-07-23 MED ORDER — SODIUM CHLORIDE 0.9 % IV SOLN: 100.0000 mL | INTRAVENOUS | Status: DC | PRN

## 2017-07-23 MED ORDER — LIDOCAINE HCL (PF) 1 % IJ SOLN: 5.0000 mL | INTRAMUSCULAR | Status: DC | PRN

## 2017-07-23 MED ORDER — LIDOCAINE-PRILOCAINE 2.5-2.5 % EX CREA
1.0000 "application " | TOPICAL_CREAM | CUTANEOUS | Status: DC | PRN
  Filled 2017-07-23: qty 5

## 2017-07-23 MED ORDER — HEPARIN SODIUM (PORCINE) 1000 UNIT/ML DIALYSIS: 20.0000 [IU]/kg | INTRAMUSCULAR | Status: DC | PRN

## 2017-07-23 MED ORDER — PENTAFLUOROPROP-TETRAFLUOROETH EX AERO: 1.0000 "application " | INHALATION_SPRAY | CUTANEOUS | Status: DC | PRN

## 2017-07-23 MED ORDER — OXYCODONE HCL 5 MG PO TABS
ORAL_TABLET | ORAL | Status: AC
  Filled 2017-07-23: qty 1

## 2017-07-23 NOTE — Progress Notes (Signed)
ANTICOAGULATION CONSULT NOTE - Follow Up Consult  Pharmacy Consult for Warfarin Indication: atrial fibrillation  No Known Allergies  Labs:  Recent Labs  07/21/17 0623 07/22/17 0546 07/23/17 0530  HGB 8.6* 8.8* 9.5*  HCT 27.5* 28.2* 30.5*  PLT 173 183 179  LABPROT 25.2* 25.9* 22.8*  INR 2.31 2.39 2.03  HEPARINUNFRC 0.43  --   --   CREATININE 3.73* 2.96* 2.71*    Estimated Creatinine Clearance: 29.1 mL/min (A) (by C-G formula based on SCr of 2.71 mg/dL (H)).   Assessment: Pt on warfarin PTA for afib/MVR (bioprosthetic). Warfarin held pending HD access placement and he was started on a heparin bridge. S/P DCCV for Afib 8/23 but back in afib on amiodarone. Underwent fistula and TDC placement 8/27. Heparin and warfarin resumed s/p EGD and colonoscopy 8/30. Heparin stopped 9/4. INR therapeutic at 2.03. Amiodarone discontinued 9/2. CBC stable.   Noted that he may need CRRT - will follow up anticoagulation plan if this happens.  PTA warfarin dose was 2.5 mg daily  Goal of Therapy:  INR 2-3 Monitor platelets by anticoagulation protocol: Yes   Plan:  1) Warfarin 2.5mg  tonight 3) Daily INR, CBC  Nena Jordan, PharmD, BCPS 07/23/2017 1:32 PM

## 2017-07-23 NOTE — Progress Notes (Signed)
Patient ID: Adonis Yim, male   DOB: April 20, 1961, 56 y.o.   MRN: [id number]      Advanced Heart Failure Rounding Note  Primary Cardiologist: Dr. Harrington Challenger HF Cardiology: Aundra Dubin  Subjective:    Started CVVHD 8/15 and stopped milrinone. CVVH stopped 8/21. On midodrine with stable BP now.  With ongoing volume overload, getting daily HD.   On 8/23, he had TEE-guided DCCV to NSR.  Reverted back to AF 8/24  He had CT abdomen/pelvis 8/27: no retroperitoneal hematoma or other cause of fall in hemoglobin or back pain.   8/30 S/P EGD and Colonoscopy. 2 polyps removed.   Feeling OK. Denies SOB or CP. Continues to have large appetite and disregard fluid restrictions per nursing.   INR 2.03. Remains in AF with controlled HR.    Studies: Echo (7/18): EF 50-55%, mild LVH, mild AI, bioprosthetic mitral valve appeared to function normally, RV moderately dilated with moderately decreased systolic function, D-shaped interventricular septum suggestive of RV pressure/volume overload, severe TR, PASP 54 mmHg  RHC Procedural Findings: Hemodynamics (mmHg) RA mean 27 RV 63/8, mean 20 PA 58/28, mean 40 PCWP mean 28 Oxygen saturations: PA 69% AO 96% Cardiac Output (Fick) 9.82  Cardiac Index (Fick) 4.96 PVR 1.2 WU  TEE (8/23): Normal LV size and systolic function, EF 02-72%.  Normal wall thickness and motion.  D-shaped interventricular septum suggestive of RV pressure/volume overload.  The RV was severely dilated with moderately decreased systolic function.  Severe LAE, no thrombus seen.  The LA appendage appears to have been ligated.  Moderate right atrial enlargement.  No PFO/ASD by color doppler.  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  Peak RV-RA gradient 25 mmHg.  Status post mitral valve replacement with bioprosthetic valve.  Trivial MR.  Mean gradient 6 mmHg across valve, does not appear to have significant stenosis.  Trileaflet aortic valve with moderate aortic regurgitation, no  significant stenosis.  Normal caliber aorta with grade III plaque in descending thoracic aorta.   Objective:   Weight Range: 183 lb 13.8 oz (83.4 kg) Body mass index is 30.6 kg/m.   Vital Signs:   Temp:  [97.4 F (36.3 C)-98.5 F (36.9 C)] 98.5 F (36.9 C) (09/06 0704) Pulse Rate:  [70-82] 76 (09/06 0718) Resp:  [12-22] 22 (09/06 0704) BP: (96-131)/(51-75) 131/63 (09/06 0718) SpO2:  [98 %-100 %] 100 % (09/06 0704) Weight:  [181 lb 3.5 oz (82.2 kg)-184 lb (83.5 kg)] 183 lb 13.8 oz (83.4 kg) (09/06 0704) Last BM Date: 07/22/17  Weight change: Filed Weights   07/22/17 1130 07/23/17 0301 07/23/17 0704  Weight: 181 lb 3.5 oz (82.2 kg) 184 lb (83.5 kg) 183 lb 13.8 oz (83.4 kg)    Intake/Output:   Intake/Output Summary (Last 24 hours) at 07/23/17 0815 Last data filed at 07/22/17 2300  Gross per 24 hour  Intake              118 ml  Output             4500 ml  Net            -4382 ml      Physical Exam   General: Fatigued appearing. No resp difficulty. HEENT: Normal Neck: Supple. JVP to jaw. Carotids 2+ bilat; no bruits. No thyromegaly or nodule noted. Cor: PMI nondisplaced. RRR, No M/G/R noted Lungs: CTAB, normal effort. Abdomen: Soft, non-tender, non-distended, no HSM. No bruits or masses. +BS  Extremities: No cyanosis, clubbing, or rash. 1-2+ edema to  thighs.   Neuro: Alert & orientedx3, cranial nerves grossly intact. moves all 4 extremities w/o difficulty. Affect pleasant   Telemetry   Afib 80s, personally reviewed.    Labs    CBC  Recent Labs  07/22/17 0546 07/23/17 0530  WBC 8.5 6.4  HGB 8.8* 9.5*  HCT 28.2* 30.5*  MCV 85.7 88.7  PLT 183 629   Basic Metabolic Panel  Recent Labs  07/22/17 0546 07/23/17 0530  NA 133* 134*  K 4.1 4.0  CL 96* 96*  CO2 26 29  GLUCOSE 90 80  BUN 32* 24*  CREATININE 2.96* 2.71*  CALCIUM 8.7* 8.7*   Liver Function Tests No results for input(s): AST, ALT, ALKPHOS, BILITOT, PROT, ALBUMIN in the last 72 hours. No  results for input(s): LIPASE, AMYLASE in the last 72 hours. Cardiac Enzymes No results for input(s): CKTOTAL, CKMB, CKMBINDEX, TROPONINI in the last 72 hours.  BNP: BNP (last 3 results)  Recent Labs  10/05/16 0147 05/30/17 1208 06/25/17 1253  BNP 237.4* 1,404.9* 875.7*    ProBNP (last 3 results)  Recent Labs  03/16/17 1401  PROBNP 6,530*   Imaging   Transthoracic Echocardiography 06/01/17  Study Conclusions  - Left ventricle: The cavity size was normal. There was mild   concentric hypertrophy. Systolic function was normal. The   estimated ejection fraction was in the range of 50% to 55%. Wall   motion was normal; there were no regional wall motion   abnormalities. - Ventricular septum: The contour showed diastolic flattening. - Aortic valve: Transvalvular velocity was minimally increased.   There was no stenosis. There was mild regurgitation. Peak   velocity (S): 201 cm/s. - Mitral valve: A bioprosthesis was present and functioning   normally. Mean gradient (D): 6 mm Hg. Valve area by pressure   half-time: 1.93 cm^2. - Left atrium: The atrium was severely dilated. - Right ventricle: The cavity size was moderately dilated. Wall   thickness was normal. Systolic function was moderately reduced. - Right atrium: The atrium was moderately dilated. - Tricuspid valve: There was severe regurgitation. - Pulmonary arteries: Systolic pressure was moderately increased.   PA peak pressure: 54 mm Hg (S).  RIGHT HEART CATH 06/29/17 RA mean 27 RV 63/8, mean 20 PA 58/28, mean 40 PCWP mean 28  Oxygen saturations: PA 69% AO 96%  Cardiac Output (Fick) 9.82  Cardiac Index (Fick) 4.96 PVR 1.2 WU  Medications:     Scheduled Medications: . atorvastatin  20 mg Oral Daily  . calcium acetate  667 mg Oral TID WC  . darbepoetin (ARANESP) injection - NON-DIALYSIS  200 mcg Subcutaneous Q Sat-1800  . docusate sodium  100 mg Oral BID  . feeding supplement (NEPRO CARB STEADY)  237  mL Oral q morning - 10a  . heparin  40 Units/kg Dialysis Once in dialysis  . insulin aspart  0-5 Units Subcutaneous QHS  . insulin aspart  0-9 Units Subcutaneous TID WC  . metoCLOPramide (REGLAN) injection  10 mg Intravenous Once  . metoprolol succinate  25 mg Oral BID  . midodrine  5 mg Oral TID WC  . pantoprazole  40 mg Oral Daily  . polyethylene glycol  17 g Oral Daily  . sodium chloride  2 spray Each Nare BID  . sodium chloride flush  3 mL Intravenous Q12H  . Warfarin - Pharmacist Dosing Inpatient   Does not apply q1800    Infusions: . sodium chloride 0  (07/01/17 1500)  . sodium chloride Stopped (07/06/17 0814)  .  sodium chloride    . sodium chloride    . 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 Patient underwent mitral valve replacement and tricuspid valve repair in [place] in 2012.    Assessment/Plan   1. Acute on chronic diastolic CHF/RV failure: EF 50-55% on echo in 7/18 with prominent RV failure (enlarged/hypokinetic RV with D-shaped interventricular septum), severe TR.  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  Milrinone was started at admission for RV support, stopped now that he is getting RRT.  CVVH stopped 8/21 and  has been getting regular iHD since then.   - He remains volume overloaded on exam.   - Accepted at [facility] for outpatient Mon/Wed/Friday  - Now getting daily HD.  His weight is up 2 lbs despite several days in a row of dialysis. May need CVVHD again to get volume off and set baseline.  2. ARF on CKD stage IV: Now dialysis dependent. - Significant RV failure will complicate this but he has done well with it so far.  He lives alone and doesn't have much help with his healthcare so chance at heart-kidney transplant would be very low. VVS has seen. He had tunneled catheter and AV fistula 07/13/17.  - Tolerating HD so far, on midodrine to keep BP up. NO change.  3. Atrial fibrillation: Persistent.  Has been in atrial fibrillation for a number of months. S/p TEE-guided DCCV on 8/23.  Back in afib on 8/24 and has stayed in afib. HR stable.   - Would not repeat DCCV.   - Continue Toprol XL 25 mg bid, now off amiodarone.    - INR 2.03. Dosing per pharm.  4. Anemia: He has had BRBPR but not in the last few days.  Transfusion this admission. GI appreciated. S/P EGD/Colonoscopy 8/30 with a 2 polyps removed and internal hemorrhoids. - Received PRBC on 8/30 for hgb 6.6. Todays hgb 9.5. Continue warfarin.  5. Bioprosthetic mitral valve: Stable appearance on 7/18 echo.  No change.   6. Probable sleep apnea: Will need sleep study outpatient. No change.  7. Acute gout in R foot: Improved, stop prednisone.   8. Low back pain: Ongoing with slow improvement.  CT did not show RP hematoma or other cause for back pain. Colonoscopy was OK.  9. Disposition:  - Will follow up with Dr. Harrington Challenger.  - Will need coumadin follow up.   OK for home once fluid controlled via HD.   Length of Stay: [place], [place]  07/23/2017, 8:15 AM  Advanced Heart Failure Team Pager [phone] (M-F; 7a - 4p)  Please contact [facility] for night-coverage after hours (4p -7a ) and weekends on  [email or web address]  Patient seen with PA, agree with the above note.  He is still volume overloaded, will need HD again versus restart CVVH today => plan per nephrology.  Discharge deferred until volume controlled.   Rate-controlled  in atrial fibrillation, on midodrine to keep BP up with HD.  INR therapeutic.   [name] 07/23/2017 9:33 AM

## 2017-07-23 NOTE — Procedures (Signed)
Weight falling again, BP stable.  Will continue daily treatment. Michael [name] C

## 2017-07-23 NOTE — Progress Notes (Signed)
Pt c/o severe right knee pain, sts 10/10 with movement. Unable to walk. Will f/u tomorrow as time allows. Yves Dill CES, ACSM 2:40 PM 07/23/2017

## 2017-07-23 NOTE — Progress Notes (Signed)
Patient received back from dialysis, VSS.  Patient up to chair with minimal standby assist, c/o pain in the right knee for which they gave him oxycodone in dialysis.  Lunch tray ordered.  Will continue to monitor pain.

## 2017-07-24 LAB — RENAL FUNCTION PANEL
Albumin: 3.1 g/dL — ABNORMAL LOW (ref 3.5–5.0)
Anion gap: 8 (ref 5–15)
BUN: 23 mg/dL — ABNORMAL HIGH (ref 6–20)
CO2: 30 mmol/L (ref 22–32)
Calcium: 8.6 mg/dL — ABNORMAL LOW (ref 8.9–10.3)
Chloride: 94 mmol/L — ABNORMAL LOW (ref 101–111)
Creatinine, Ser: 2.78 mg/dL — ABNORMAL HIGH (ref 0.61–1.24)
GFR calc Af Amer: 27 mL/min — ABNORMAL LOW (ref >60)
GFR calc non Af Amer: 24 mL/min — ABNORMAL LOW (ref >60)
Glucose, Bld: 98 mg/dL (ref 65–99)
Phosphorus: 3.8 mg/dL (ref 2.5–4.6)
Potassium: 4.3 mmol/L (ref 3.5–5.1)
Sodium: 132 mmol/L — ABNORMAL LOW (ref 135–145)

## 2017-07-24 LAB — BASIC METABOLIC PANEL
ANION GAP: 8 (ref 5–15)
BUN: 23 mg/dL — ABNORMAL HIGH (ref 6–20)
CALCIUM: 8.7 mg/dL — AB (ref 8.9–10.3)
CHLORIDE: 94 mmol/L — AB (ref 101–111)
CO2: 30 mmol/L (ref 22–32)
Creatinine, Ser: 2.74 mg/dL — ABNORMAL HIGH (ref 0.61–1.24)
GFR calc non Af Amer: 24 mL/min — ABNORMAL LOW (ref >60)
GFR, EST AFRICAN AMERICAN: 28 mL/min — AB (ref >60)
GLUCOSE: 97 mg/dL (ref 65–99)
Potassium: 4.2 mmol/L (ref 3.5–5.1)
Sodium: 132 mmol/L — ABNORMAL LOW (ref 135–145)

## 2017-07-24 LAB — GLUCOSE, CAPILLARY
GLUCOSE-CAPILLARY: 113 mg/dL — AB (ref 65–99)
GLUCOSE-CAPILLARY: 100 mg/dL — AB (ref 65–99)
Glucose-Capillary: 118 mg/dL — ABNORMAL HIGH (ref 65–99)
Glucose-Capillary: 129 mg/dL — ABNORMAL HIGH (ref 65–99)
Glucose-Capillary: 224 mg/dL — ABNORMAL HIGH (ref 65–99)

## 2017-07-24 LAB — CBC
HEMATOCRIT: 29.9 % — AB (ref 39.0–52.0)
Hemoglobin: 9.4 g/dL — ABNORMAL LOW (ref 13.0–17.0)
MCH: 27.9 pg (ref 26.0–34.0)
MCHC: 31.4 g/dL (ref 30.0–36.0)
MCV: 88.7 fL (ref 78.0–100.0)
Platelets: 177 10*3/uL (ref 150–400)
RBC: 3.37 MIL/uL — AB (ref 4.22–5.81)
RDW: 22.7 % — ABNORMAL HIGH (ref 11.5–15.5)
WBC: 6.9 10*3/uL (ref 4.0–10.5)

## 2017-07-24 LAB — PROTIME-INR
INR: 1.69
PROTHROMBIN TIME: 19.7 s — AB (ref 11.4–15.2)

## 2017-07-24 LAB — URIC ACID: Uric Acid, Serum: 3.1 mg/dL — ABNORMAL LOW (ref 4.4–7.6)

## 2017-07-24 MED ORDER — VANCOMYCIN HCL 10 G IV SOLR
1500.0000 mg | Freq: Once | INTRAVENOUS | Status: AC
  Administered 2017-07-24: 1500 mg via INTRAVENOUS
  Filled 2017-07-24 (×2): qty 1500

## 2017-07-24 MED ORDER — ACETAMINOPHEN 325 MG PO TABS
650.0000 mg | ORAL_TABLET | Freq: Three times a day (TID) | ORAL | Status: DC
  Administered 2017-07-24 – 2017-07-27 (×8): 650 mg via ORAL
  Filled 2017-07-24 (×8): qty 2

## 2017-07-24 MED ORDER — PREDNISONE 20 MG PO TABS
40.0000 mg | ORAL_TABLET | Freq: Every day | ORAL | Status: DC
  Administered 2017-07-25 – 2017-07-27 (×3): 40 mg via ORAL
  Filled 2017-07-24 (×4): qty 2

## 2017-07-24 MED ORDER — PREDNISONE 20 MG PO TABS: 40.0000 mg | ORAL_TABLET | Freq: Every day | ORAL | Status: DC

## 2017-07-24 MED ORDER — MORPHINE SULFATE (PF) 2 MG/ML IV SOLN
2.0000 mg | INTRAVENOUS | Status: DC | PRN
  Administered 2017-07-24 – 2017-07-25 (×2): 2 mg via INTRAVENOUS
  Filled 2017-07-24 (×2): qty 1

## 2017-07-24 MED ORDER — HYDROMORPHONE HCL 1 MG/ML IJ SOLN
INTRAMUSCULAR | Status: AC
  Filled 2017-07-24: qty 0.5

## 2017-07-24 MED ORDER — WARFARIN SODIUM 5 MG PO TABS
5.0000 mg | ORAL_TABLET | Freq: Once | ORAL | Status: AC
  Administered 2017-07-24: 5 mg via ORAL
  Filled 2017-07-24: qty 1

## 2017-07-24 MED ORDER — OXYCODONE HCL 5 MG PO TABS
ORAL_TABLET | ORAL | Status: AC
  Filled 2017-07-24: qty 1

## 2017-07-24 MED ORDER — PREDNISONE 20 MG PO TABS
40.0000 mg | ORAL_TABLET | Freq: Once | ORAL | Status: AC
  Administered 2017-07-24: 40 mg via ORAL

## 2017-07-24 NOTE — Progress Notes (Signed)
[id number] Came to see pt to see if he wanted to walk. Pt moaning and grimacing with RN taking BP. When asked what was wrong, he stated bottom of feet hurting. Continued to moan. Will hold ambulation and continue to follow. Pt declined walk at this time. Graylon Good RN BSN 07/24/2017 1:47 PM

## 2017-07-24 NOTE — Progress Notes (Signed)
PT Cancellation Note  Patient Details Name: Michael [name] MRN: [id number] DOB: 07-Oct-1961   Cancelled Treatment:    Reason Eval/Treat Not Completed: Patient at procedure or test/unavailable. Pt currently in HD. Will continue to follow and continue with PT POC as able.    [name] 07/24/2017, 7:30 AM   [name], PT, DPT Acute Rehabilitation Services Pager: [phone]

## 2017-07-24 NOTE — Progress Notes (Addendum)
Called by nursing staff for severe left hand pain, swelling, and fever. Chart reviewed, patient is ESRD patient with left basilic vein av fistula done 07/13/17. We have ordered blood cultures x2 given fever. Discussed with vascular, they will evaluate patient tomorrow AM. Will give emperic dose of IV vancomycin for possible soft tissue infection. Continue pain control overnight.    Carlyle Dolly MD

## 2017-07-24 NOTE — Progress Notes (Signed)
Pharmacy Antibiotic Note  Patient has had an extended length of stay.   Cardiology was called about severe left hand pain, swelling and fever of 101.5. New orders to start vancomycin for possible cellulitis.   Patient is ESRD and receiving HD. Next HD planned for tomorrow. Will load with vancomycin tonight and follow HD schedule.  Plan: Vancomycin 1500 mg IV tonight Follow HD schedule - would plan on giving 750 qHD session as needed   Height: 5\' 5"  (165.1 cm) Weight: 171 lb 1.2 oz (77.6 kg) IBW/kg (Calculated) : 61.5  Temp (24hrs), Avg:99.2 F (37.3 C), Min:98.1 F (36.7 C), Max:101.5 F (38.6 C)   Recent Labs Lab 07/20/17 0339 07/21/17 0623 07/22/17 0546 07/23/17 0530 07/24/17 0448 07/24/17 1437  WBC 7.3 7.9 8.5 6.4 6.9  --   CREATININE 4.75* 3.73* 2.96* 2.71* 2.74* 2.78*    Estimated Creatinine Clearance: 28.2 mL/min (A) (by C-G formula based on SCr of 2.78 mg/dL (H)).    No Known Allergies   Thank you for allowing pharmacy to be a part of this patient's care.  Erin Hearing PharmD., BCPS Clinical Pharmacist Pager [phone] 07/24/2017 7:55 PM

## 2017-07-24 NOTE — Progress Notes (Signed)
Patient ID: Michael [name], male   DOB: 01/13/1961, 56 y.o.   MRN: [id number]      Advanced Heart Failure Rounding Note  Primary Cardiologist: Dr. Harrington Challenger HF Cardiology: Aundra Dubin  Subjective:    Started CVVHD 8/15 and stopped milrinone. CVVH stopped 8/21. On midodrine with stable BP now.  With ongoing volume overload, getting daily HD.   On 8/23, he had TEE-guided DCCV to NSR.  Reverted back to AF 8/24  He had CT abdomen/pelvis 8/27: no retroperitoneal hematoma or other cause of fall in hemoglobin or back pain.   8/30 S/P EGD and Colonoscopy. 2 polyps removed.   Denies SOB or CP. Fatigued. C/o knee pain since last night. Weight down yesterday but back up 5 lbs today. PEr Dr. Florene Glen will need HF 4 days a week.   INR 1.69 this am. Remains in AF with controlled HR.    Studies: Echo (7/18): EF 50-55%, mild LVH, mild AI, bioprosthetic mitral valve appeared to function normally, RV moderately dilated with moderately decreased systolic function, D-shaped interventricular septum suggestive of RV pressure/volume overload, severe TR, PASP 54 mmHg  RHC Procedural Findings: Hemodynamics (mmHg) RA mean 27 RV 63/8, mean 20 PA 58/28, mean 40 PCWP mean 28 Oxygen saturations: PA 69% AO 96% Cardiac Output (Fick) 9.82  Cardiac Index (Fick) 4.96 PVR 1.2 WU  TEE (8/23): Normal LV size and systolic function, EF 55-97%.  Normal wall thickness and motion.  D-shaped interventricular septum suggestive of RV pressure/volume overload.  The RV was severely dilated with moderately decreased systolic function.  Severe LAE, no thrombus seen.  The LA appendage appears to have been ligated.  Moderate right atrial enlargement.  No PFO/ASD by color doppler.  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  Peak RV-RA gradient 25 mmHg.  Status post mitral valve replacement with bioprosthetic valve.  Trivial MR.  Mean gradient 6 mmHg across valve, does not appear to have significant stenosis.  Trileaflet aortic  valve with moderate aortic regurgitation, no significant stenosis.  Normal caliber aorta with grade III plaque in descending thoracic aorta.   Objective:   Weight Range: 179 lb 14.3 oz (81.6 kg) Body mass index is 29.94 kg/m.   Vital Signs:   Temp:  [98 F (36.7 C)-98.8 F (37.1 C)] 98.2 F (36.8 C) (09/07 0650) Pulse Rate:  [58-101] 90 (09/07 0830) Resp:  [12-20] 16 (09/07 0830) BP: (93-135)/(52-89) 104/89 (09/07 0830) SpO2:  [98 %-100 %] 98 % (09/07 0650) Weight:  [174 lb 2.6 oz (79 kg)-179 lb 14.3 oz (81.6 kg)] 179 lb 14.3 oz (81.6 kg) (09/07 0650) Last BM Date: 07/23/17  Weight change: Filed Weights   07/23/17 1150 07/24/17 0535 07/24/17 0650  Weight: 174 lb 2.6 oz (79 kg) 178 lb 6.4 oz (80.9 kg) 179 lb 14.3 oz (81.6 kg)    Intake/Output:   Intake/Output Summary (Last 24 hours) at 07/24/17 0919 Last data filed at 07/24/17 0617  Gross per 24 hour  Intake              220 ml  Output             4625 ml  Net            -4405 ml      Physical Exam   General: Fatigued appearing. NAD.  HEENT: Normal Neck: Supple. JVP 9-10 cm.. Carotids 2+ bilat; no bruits. No thyromegaly or nodule noted. Cor: PMI nondisplaced. Irregularly irregular., No M/G/R noted Lungs: CTAB, normal effort. Abdomen: Soft,  non-tender, non-distended, no HSM. No bruits or masses. +BS  Extremities: No cyanosis, clubbing, or rash. 1+ edema to knees.  Neuro: Alert & orientedx3, cranial nerves grossly intact. moves all 4 extremities w/o difficulty. Affect pleasant   Telemetry   Afib 80s, personally reviewed.   Labs    CBC  Recent Labs  07/23/17 0530 07/24/17 0448  WBC 6.4 6.9  HGB 9.5* 9.4*  HCT 30.5* 29.9*  MCV 88.7 88.7  PLT 179 884   Basic Metabolic Panel  Recent Labs  07/23/17 0530 07/24/17 0448  NA 134* 132*  K 4.0 4.2  CL 96* 94*  CO2 29 30  GLUCOSE 80 97  BUN 24* 23*  CREATININE 2.71* 2.74*  CALCIUM 8.7* 8.7*   Liver Function Tests No results for input(s): AST, ALT,  ALKPHOS, BILITOT, PROT, ALBUMIN in the last 72 hours. No results for input(s): LIPASE, AMYLASE in the last 72 hours. Cardiac Enzymes No results for input(s): CKTOTAL, CKMB, CKMBINDEX, TROPONINI in the last 72 hours.  BNP: BNP (last 3 results)  Recent Labs  10/05/16 0147 05/30/17 1208 06/25/17 1253  BNP 237.4* 1,404.9* 875.7*    ProBNP (last 3 results)  Recent Labs  03/16/17 1401  PROBNP 6,530*   Imaging   Transthoracic Echocardiography 06/01/17  Study Conclusions  - Left ventricle: The cavity size was normal. There was mild   concentric hypertrophy. Systolic function was normal. The   estimated ejection fraction was in the range of 50% to 55%. Wall   motion was normal; there were no regional wall motion   abnormalities. - Ventricular septum: The contour showed diastolic flattening. - Aortic valve: Transvalvular velocity was minimally increased.   There was no stenosis. There was mild regurgitation. Peak   velocity (S): 201 cm/s. - Mitral valve: A bioprosthesis was present and functioning   normally. Mean gradient (D): 6 mm Hg. Valve area by pressure   half-time: 1.93 cm^2. - Left atrium: The atrium was severely dilated. - Right ventricle: The cavity size was moderately dilated. Wall   thickness was normal. Systolic function was moderately reduced. - Right atrium: The atrium was moderately dilated. - Tricuspid valve: There was severe regurgitation. - Pulmonary arteries: Systolic pressure was moderately increased.   PA peak pressure: 54 mm Hg (S).  RIGHT HEART CATH 06/29/17 RA mean 27 RV 63/8, mean 20 PA 58/28, mean 40 PCWP mean 28  Oxygen saturations: PA 69% AO 96%  Cardiac Output (Fick) 9.82  Cardiac Index (Fick) 4.96 PVR 1.2 WU  Medications:     Scheduled Medications: . atorvastatin  20 mg Oral Daily  . calcium acetate  667 mg Oral TID WC  . darbepoetin (ARANESP) injection - NON-DIALYSIS  200 mcg Subcutaneous Q Sat-1800  . docusate sodium  100 mg  Oral BID  . feeding supplement (NEPRO CARB STEADY)  237 mL Oral q morning - 10a  . insulin aspart  0-5 Units Subcutaneous QHS  . insulin aspart  0-9 Units Subcutaneous TID WC  . metoCLOPramide (REGLAN) injection  10 mg Intravenous Once  . metoprolol succinate  25 mg Oral BID  . midodrine  5 mg Oral TID WC  . pantoprazole  40 mg Oral Daily  . polyethylene glycol  17 g Oral Daily  . sodium chloride  2 spray Each Nare BID  . sodium chloride flush  3 mL Intravenous Q12H  . Warfarin - Pharmacist Dosing Inpatient   Does not apply q1800    Infusions: . sodium chloride 0  (07/01/17 1500)  .  sodium chloride Stopped (07/06/17 0814)  . 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 Patient underwent mitral valve replacement and tricuspid valve repair in [place] in 2012.    Assessment/Plan   1. Acute on chronic diastolic CHF/RV failure: EF 50-55% on echo in 7/18 with prominent RV failure (enlarged/hypokinetic RV with D-shaped interventricular septum), severe TR.  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  Milrinone was started at admission for RV support, stopped now that he is getting RRT.  CVVH stopped 8/21 and has been getting regular iHD since then.   - Volume status remains elevated but improving on increased dialysis.  - Accepted at [facility] for  outpatient HD. Per Dr. Florene Glen, plan to increase frequency to 4 times weekly.  2. ARF on CKD stage IV: Now dialysis dependent. - Significant RV failure will complicate this but he has done well with it so far.  He lives alone and doesn't have much help with his healthcare so chance at heart-kidney transplant would be very low. VVS has seen. He had tunneled catheter and AV fistula 07/13/17.  - Tolerating HD so far, on midodrine to keep BP up. No change.  3. Atrial fibrillation: Persistent.  Has been in atrial fibrillation for a number of months. S/p TEE-guided DCCV on 8/23.  Back in afib on 8/24 and has stayed in afib. HR stable.   - Would not repeat DCCV.   - Continue Toprol XL 25 mg bid, now off amiodarone.    - INR 1.69. Dosing per pharm. He is getting heparin with HD which may need enough coverage.  4. Anemia: He has had BRBPR but not in the last few days.  Transfusion this admission. GI appreciated. S/P EGD/Colonoscopy 8/30 with a 2 polyps removed and internal hemorrhoids. - Received PRBC on 8/30 for hgb 6.6. Todays hgb 9.4. Continue warfarin.  5. Bioprosthetic mitral valve: Stable appearance on 7/18 echo.  No change.  6. Probable sleep apnea: Will need sleep study outpatient. No change.  7. Acute gout in R foot: Improved, stop prednisone.   8. Low back pain: Ongoing with slow improvement.  CT did not show RP hematoma or other cause for back pain. Colonoscopy was OK.  9. Disposition:  - Will follow up with Dr. Harrington Challenger.  - Will need coumadin follow up. Will need to schedule once HD schedule confirmed.   Per Dr. Florene Glen note likely home tomorrow after HD. Will need HD 4 times a week. Will follow up with Dr. Harrington Challenger. He is not appropriate for AHF clinic with ESRD.  Pt has a very poor overall prognosis with need for HD 4 days weekly.  Length of Stay: [place], [place]  07/24/2017, 9:19 AM  Advanced Heart Failure Team Pager [phone] (M-F; 7a - 4p)  Please contact [facility] for  night-coverage after hours (4p -7a ) and weekends on [email or web address]  Patient seen and examined with the above-signed Advanced Practice Provider and/or Housestaff. I personally reviewed laboratory  data, imaging studies and relevant notes. I independently examined the patient and formulated the important aspects of the plan. I have edited the note to reflect any of my changes or salient points. I have personally discussed the plan with the patient and/or family.  Continues to struggle with volume overload. Requiring extra HD. Low BP has limited fluid removal. ? Would he do better with PD.   Remains in AF but rate is controlled. Continue heparin/warfarin.   [place] for d/c from our standpoint.   Glori Bickers, MD  3:13 PM

## 2017-07-24 NOTE — Progress Notes (Signed)
Pt c/o LUA and Left hand swelling with 3+ edema.  Pulse present, warm to touch.  Jettie Booze, NP, Cards, assessed.  New morphine 18mb IV q2h PRN orders placed to address associated pain.

## 2017-07-24 NOTE — Procedures (Signed)
MWF 11:50 at [facility] for OP HD. He will need 4 days per week dialysis. I will arrange. We continue to be challenged with fluid removal.  If we can arrange 4 days per week treatment, he can be discharged after treatment Sat. Cleotilde Spadaccini C

## 2017-07-24 NOTE — Progress Notes (Signed)
ANTICOAGULATION CONSULT NOTE - Follow Up Consult  Pharmacy Consult for Warfarin Indication: atrial fibrillation  No Known Allergies  Labs:  Recent Labs  07/22/17 0546 07/23/17 0530 07/24/17 0448  HGB 8.8* 9.5* 9.4*  HCT 28.2* 30.5* 29.9*  PLT 183 179 177  LABPROT 25.9* 22.8* 19.7*  INR 2.39 2.03 1.69  CREATININE 2.96* 2.71* 2.74*    Estimated Creatinine Clearance: 28.6 mL/min (A) (by C-G formula based on SCr of 2.74 mg/dL (H)).   Assessment: Pt on warfarin PTA for afib/MVR (bioprosthetic). Warfarin held pending HD access placement and he was started on a heparin bridge. S/P DCCV for Afib 8/23 but back in afib on amiodarone. Underwent fistula and TDC placement 8/27. Heparin and warfarin resumed s/p EGD and colonoscopy 8/30. Heparin stopped 9/4. INR now below goal at 1.69. Amiodarone discontinued 9/2. CBC stable.   Plan is to send home tomorrow with HD 4 days a week.   PTA warfarin dose was 2.5 mg daily  Goal of Therapy:  INR 2-3 Monitor platelets by anticoagulation protocol: Yes   Plan:  1) Warfarin 5mg  tonight 2) No need to restart heparin per Dr. Haroldine Laws 3) Daily INR, CBC  Nena Jordan, PharmD, BCPS 07/24/2017 1:21 PM

## 2017-07-25 ENCOUNTER — Inpatient Hospital Stay (HOSPITAL_COMMUNITY): Payer: Medicare Other

## 2017-07-25 DIAGNOSIS — M1000 Idiopathic gout, unspecified site: Secondary | ICD-10-CM

## 2017-07-25 DIAGNOSIS — M7989 Other specified soft tissue disorders: Secondary | ICD-10-CM

## 2017-07-25 DIAGNOSIS — M79609 Pain in unspecified limb: Secondary | ICD-10-CM

## 2017-07-25 LAB — URINALYSIS, COMPLETE (UACMP) WITH MICROSCOPIC
BILIRUBIN URINE: NEGATIVE
Glucose, UA: NEGATIVE mg/dL
Ketones, ur: NEGATIVE mg/dL
LEUKOCYTES UA: NEGATIVE
Nitrite: NEGATIVE
Protein, ur: 100 mg/dL — AB
SPECIFIC GRAVITY, URINE: 1.018 (ref 1.005–1.030)
pH: 5 (ref 5.0–8.0)

## 2017-07-25 LAB — BASIC METABOLIC PANEL
ANION GAP: 7 (ref 5–15)
BUN: 23 mg/dL — ABNORMAL HIGH (ref 6–20)
CALCIUM: 8.7 mg/dL — AB (ref 8.9–10.3)
CO2: 27 mmol/L (ref 22–32)
CREATININE: 2.81 mg/dL — AB (ref 0.61–1.24)
Chloride: 95 mmol/L — ABNORMAL LOW (ref 101–111)
GFR, EST AFRICAN AMERICAN: 27 mL/min — AB (ref >60)
GFR, EST NON AFRICAN AMERICAN: 23 mL/min — AB (ref >60)
GLUCOSE: 204 mg/dL — AB (ref 65–99)
Potassium: 4.8 mmol/L (ref 3.5–5.1)
Sodium: 129 mmol/L — ABNORMAL LOW (ref 135–145)

## 2017-07-25 LAB — PROTIME-INR
INR: 1.77
PROTHROMBIN TIME: 20.5 s — AB (ref 11.4–15.2)

## 2017-07-25 LAB — RENAL FUNCTION PANEL
ALBUMIN: 3 g/dL — AB (ref 3.5–5.0)
Anion gap: 8 (ref 5–15)
BUN: 23 mg/dL — AB (ref 6–20)
CALCIUM: 8.6 mg/dL — AB (ref 8.9–10.3)
CO2: 27 mmol/L (ref 22–32)
Chloride: 94 mmol/L — ABNORMAL LOW (ref 101–111)
Creatinine, Ser: 2.8 mg/dL — ABNORMAL HIGH (ref 0.61–1.24)
GFR calc Af Amer: 27 mL/min — ABNORMAL LOW (ref >60)
GFR, EST NON AFRICAN AMERICAN: 24 mL/min — AB (ref >60)
GLUCOSE: 201 mg/dL — AB (ref 65–99)
PHOSPHORUS: 3.7 mg/dL (ref 2.5–4.6)
Potassium: 4.8 mmol/L (ref 3.5–5.1)
SODIUM: 129 mmol/L — AB (ref 135–145)

## 2017-07-25 LAB — CBC
HCT: 29 % — ABNORMAL LOW (ref 39.0–52.0)
Hemoglobin: 8.8 g/dL — ABNORMAL LOW (ref 13.0–17.0)
MCH: 26.7 pg (ref 26.0–34.0)
MCHC: 30.3 g/dL (ref 30.0–36.0)
MCV: 88.1 fL (ref 78.0–100.0)
PLATELETS: 145 10*3/uL — AB (ref 150–400)
RBC: 3.29 MIL/uL — AB (ref 4.22–5.81)
RDW: 22.1 % — ABNORMAL HIGH (ref 11.5–15.5)
WBC: 6.8 10*3/uL (ref 4.0–10.5)

## 2017-07-25 LAB — GLUCOSE, CAPILLARY
GLUCOSE-CAPILLARY: 192 mg/dL — AB (ref 65–99)
Glucose-Capillary: 116 mg/dL — ABNORMAL HIGH (ref 65–99)
Glucose-Capillary: 287 mg/dL — ABNORMAL HIGH (ref 65–99)

## 2017-07-25 MED ORDER — DARBEPOETIN ALFA 200 MCG/0.4ML IJ SOSY
PREFILLED_SYRINGE | INTRAMUSCULAR | Status: AC
  Administered 2017-07-25: 200 ug via SUBCUTANEOUS
  Filled 2017-07-25: qty 0.4

## 2017-07-25 MED ORDER — VANCOMYCIN HCL IN DEXTROSE 750-5 MG/150ML-% IV SOLN: 750.0000 mg | INTRAVENOUS | Status: DC

## 2017-07-25 MED ORDER — VANCOMYCIN HCL IN DEXTROSE 750-5 MG/150ML-% IV SOLN
750.0000 mg | INTRAVENOUS | Status: AC
  Administered 2017-07-25: 750 mg via INTRAVENOUS

## 2017-07-25 MED ORDER — ALTEPLASE 2 MG IJ SOLR: 2.0000 mg | Freq: Once | INTRAMUSCULAR | Status: DC | PRN

## 2017-07-25 MED ORDER — VANCOMYCIN HCL IN DEXTROSE 750-5 MG/150ML-% IV SOLN
INTRAVENOUS | Status: AC
  Administered 2017-07-25: 750 mg via INTRAVENOUS
  Filled 2017-07-25: qty 150

## 2017-07-25 MED ORDER — PENTAFLUOROPROP-TETRAFLUOROETH EX AERO: 1.0000 "application " | INHALATION_SPRAY | CUTANEOUS | Status: DC | PRN

## 2017-07-25 MED ORDER — SODIUM CHLORIDE 0.9 % IV SOLN: 100.0000 mL | INTRAVENOUS | Status: DC | PRN

## 2017-07-25 MED ORDER — LIDOCAINE HCL (PF) 1 % IJ SOLN: 5.0000 mL | INTRAMUSCULAR | Status: DC | PRN

## 2017-07-25 MED ORDER — HEPARIN SODIUM (PORCINE) 1000 UNIT/ML DIALYSIS: 1000.0000 [IU] | INTRAMUSCULAR | Status: DC | PRN

## 2017-07-25 MED ORDER — LIDOCAINE-PRILOCAINE 2.5-2.5 % EX CREA: 1.0000 "application " | TOPICAL_CREAM | CUTANEOUS | Status: DC | PRN

## 2017-07-25 MED ORDER — HEPARIN SODIUM (PORCINE) 1000 UNIT/ML DIALYSIS: 20.0000 [IU]/kg | INTRAMUSCULAR | Status: DC | PRN

## 2017-07-25 MED ORDER — WARFARIN SODIUM 2.5 MG PO TABS
2.5000 mg | ORAL_TABLET | Freq: Once | ORAL | Status: AC
  Administered 2017-07-25: 2.5 mg via ORAL
  Filled 2017-07-25 (×2): qty 1

## 2017-07-25 NOTE — Progress Notes (Signed)
Pharmacy Antibiotic Note  30 YOM with an extended length of stay. Pharmacy was consulted to start Vancomcyin on 9/7 for severe L-hand pain, swelling, and fever concerning for cellulitis.  The patient is ESRD-MWF however per renal note, likely to now require 4x/week HD. The patient was loaded with Vancomycin on 9/7 evening and received HD on Sat, 9/8. Will enter a scheduled dose to be given after HD today, with scheduled doses on MWF. Will adjust the scheduled as needed for changes in HD plans.   Plan: 1. Vancomycin 750 mg post HD on Sat, 9/8 2. Start Vancomycin 750 mg/HD-MWF starting on 9/10 3. Per cardiology note, possible d/c of Vancomycin on 9/10 if cultures remain negative 4. Will continue to follow HD schedule/duration, culture results, LOT, and antibiotic de-escalation plans    Height: 5\' 5"  (165.1 cm) Weight: 176 lb 9.4 oz (80.1 kg) IBW/kg (Calculated) : 61.5  Temp (24hrs), Avg:99.4 F (37.4 C), Min:97.9 F (36.6 C), Max:101.5 F (38.6 C)   Recent Labs Lab 07/21/17 0623 07/22/17 0546 07/23/17 0530 07/24/17 0448 07/24/17 1437 07/25/17 0757  WBC 7.9 8.5 6.4 6.9  --  6.8  CREATININE 3.73* 2.96* 2.71* 2.74* 2.78* 2.80*  2.81*    Estimated Creatinine Clearance: 28.3 mL/min (A) (by C-G formula based on SCr of 2.81 mg/dL (H)).    No Known Allergies   Thank you for allowing pharmacy to be a part of this patient's care.  Erin Hearing PharmD., BCPS Clinical Pharmacist Pager [phone] 07/25/2017 10:24 AM

## 2017-07-25 NOTE — Progress Notes (Signed)
[facility]  Subjective  - feels better   Objective 111/63 86 97.9 F (36.6 C) (Oral) 14 100%  Intake/Output Summary (Last 24 hours) at 07/25/17 0924 Last data filed at 07/25/17 0600  Gross per 24 hour  Intake             1394 ml  Output             4120 ml  Net            -2726 ml   Left arm incision clean Left hand edema remarkably better with elevation Erythema improved  Assessment/Planning: Continue to elevate left arm Remains afebrile no leukocytosis Doubt this represents infection at this point Need to encourage pt to elevate arm.  If swelling returns consider ACE from hand to level of incision  Ruta Hinds 07/25/2017 9:24 AM --  Laboratory Lab Results:  Recent Labs  07/24/17 0448 07/25/17 0757  WBC 6.9 6.8  HGB 9.4* 8.8*  HCT 29.9* 29.0*  PLT 177 145*   BMET  Recent Labs  07/24/17 1437 07/25/17 0757  NA 132* 129*  K 4.3 4.8  CL 94* 95*  CO2 30 27  GLUCOSE 98 204*  BUN 23* 23*  CREATININE 2.78* 2.81*  CALCIUM 8.6* 8.7*    COAG Lab Results  Component Value Date   INR 1.77 07/25/2017   INR 1.69 07/24/2017   INR 2.03 07/23/2017   No results found for: PTT

## 2017-07-25 NOTE — Procedures (Signed)
Tol HD.  Having some success with fluid removal again and he feels better. I thin it is safe to discharge him.  He has questions about his transportation to HD and we will try to address. Ra Pfiester C

## 2017-07-25 NOTE — Progress Notes (Signed)
Patient arrived back on 3East after dialysis, VSS.  No complaints of pain or other at this time.

## 2017-07-25 NOTE — Plan of Care (Signed)
Problem: Fluid Volume: Goal: Ability to maintain a balanced intake and output will improve Outcome: Progressing Daily dialysis, volume overload improving   Problem: Activity: Goal: Ability to return to baseline activity level will improve Outcome: Progressing Up with standby assist

## 2017-07-25 NOTE — Progress Notes (Signed)
VASCULAR LAB PRELIMINARY  PRELIMINARY  PRELIMINARY  PRELIMINARY  Left upper extremity venous duplex completed.    Preliminary report:  There is no obvious evidence of DVT or SVT noted in the left upper extremity.  Interstitial fluid is noted throughout.  Access is patent.  Ameliarose Shark, RVT 07/25/2017, 5:04 PM

## 2017-07-25 NOTE — Progress Notes (Signed)
ANTICOAGULATION CONSULT NOTE - Follow Up Consult  Pharmacy Consult for Warfarin Indication: atrial fibrillation  No Known Allergies  Labs:  Recent Labs  07/23/17 0530 07/24/17 0448 07/24/17 1437 07/25/17 0757  HGB 9.5* 9.4*  --  8.8*  HCT 30.5* 29.9*  --  29.0*  PLT 179 177  --  145*  LABPROT 22.8* 19.7*  --  20.5*  INR 2.03 1.69  --  1.77  CREATININE 2.71* 2.74* 2.78* 2.80*  2.81*    Estimated Creatinine Clearance: 27.7 mL/min (A) (by C-G formula based on SCr of 2.81 mg/dL (H)).   Assessment: Pt on warfarin PTA for afib/MVR (bioprosthetic). Warfarin held pending HD access placement and he was started on a heparin bridge. S/P DCCV for Afib 8/23 but back in afib on amiodarone. Underwent fistula and TDC placement 8/27. Heparin and warfarin resumed s/p EGD and colonoscopy 8/30. Heparin stopped 9/4. INR now below goal at 1.69. Amiodarone discontinued 9/2. CBC stable.   INR today remains SUBtherapeutic (INR 1.77 << 1.69, goal of 2-3). Hgb/Hct/Plt slight drop - no overt bleeding noted at this time. Possible d/c 9/10 per cardiology notes.   PTA warfarin dose was 2.5 mg daily  Goal of Therapy:  INR 2-3 Monitor platelets by anticoagulation protocol: Yes   Plan:  1) Warfarin 2.5 mg x 1 dose tonight 2) No need to restart heparin per Dr. Haroldine Laws 3) Daily INR, CBC  Thank you for allowing pharmacy to be a part of this patient's care.  Alycia Rossetti, PharmD, BCPS Clinical Pharmacist Pager: [phone] Clinical phone for 07/25/2017 from 7a-3:30p: [phone] If after 3:30p, please call main pharmacy at: [phone] 07/25/2017 1:03 PM

## 2017-07-25 NOTE — Consult Note (Addendum)
[facility] by Dr Harl Bowie to evaluate pt for left hand swelling.  Pt had basilic vein fistula created 12 days ago, first stage procedure.  He had been doing fine until about 36 hours ago when he began to develop swelling in the left hand.  He has some pain in the dorsum of the hand but no numbness or tingling.  He has no incisional drainage.   Objective 119/74 98 (!) 101.2 F (38.4 C) (Oral) 20 98%  Intake/Output Summary (Last 24 hours) at 07/25/17 0139 Last data filed at 07/24/17 2312  Gross per 24 hour  Intake              918 ml  Output             4245 ml  Net            -3327 ml   Left upper extremity: left forearm with pitting edema extending to and involving dorsum of left hand, hand is pink and warm with good perfusion, antecubital incision is clean with no swelling or fluctuance or erythema.  There is no edema in the upper arm  Assessment/Planning: Left arm swelling not usual pattern for central vein issue.  Seems to be more dependent edema and possible secondary to disruption of basilic vein although unusual to have this late presentation.  This could also represent thrombophlebitis related to procedure as well.  Pt febrile to 101 more likely source of this may be catheter.  I do not believe the fistula is infected.  Agree with empiric antibiotics for now.  Would also check blood culture peripherally and from catheter.  Needs to keep arm elevated above level of heart to help with swelling  Will get venous duplex to rule out left arm DVT   Ruta Hinds 07/25/2017 1:39 AM --  Laboratory Lab Results:  Recent Labs  07/23/17 0530 07/24/17 0448  WBC 6.4 6.9  HGB 9.5* 9.4*  HCT 30.5* 29.9*  PLT 179 177   BMET  Recent Labs  07/24/17 0448 07/24/17 1437  NA 132* 132*  K 4.2 4.3  CL 94* 94*  CO2 30 30  GLUCOSE 97 98  BUN 23* 23*  CREATININE 2.74* 2.78*  CALCIUM 8.7* 8.6*    COAG Lab Results  Component Value Date    INR 1.69 07/24/2017   INR 2.03 07/23/2017   INR 2.39 07/22/2017   No results found for: PTT

## 2017-07-25 NOTE — Progress Notes (Signed)
Patient ID: Michael [name], male   DOB: November 03, 1961, 56 y.o.   MRN: [id number]      Advanced Heart Failure Rounding Note  Primary Cardiologist: Dr. Harrington Challenger HF Cardiology: Aundra Dubin  Subjective:    Started CVVHD 8/15 and stopped milrinone. CVVH stopped 8/21. On midodrine with stable BP now.  With ongoing volume overload, getting daily HD.   On 8/23, he had TEE-guided DCCV to NSR.  Reverted back to AF 8/24  He had CT abdomen/pelvis 8/27: no retroperitoneal hematoma or other cause of fall in hemoglobin or back pain.   8/30 S/P EGD and Colonoscopy. 2 polyps removed.   Developed painful left wrist and right knee yesterday with fever to 101. Bcx drawn. Started on vanc. I felt it might be gout and added prednisone.   Uric acid ok. I saw patient in HD. Got 3L off today. Feels ok. Hand and knee much better. Less tender. Breathingok     Studies: Echo (7/18): EF 50-55%, mild LVH, mild AI, bioprosthetic mitral valve appeared to function normally, RV moderately dilated with moderately decreased systolic function, D-shaped interventricular septum suggestive of RV pressure/volume overload, severe TR, PASP 54 mmHg  RHC Procedural Findings: Hemodynamics (mmHg) RA mean 27 RV 63/8, mean 20 PA 58/28, mean 40 PCWP mean 28 Oxygen saturations: PA 69% AO 96% Cardiac Output (Fick) 9.82  Cardiac Index (Fick) 4.96 PVR 1.2 WU  TEE (8/23): Normal LV size and systolic function, EF 62-95%.  Normal wall thickness and motion.  D-shaped interventricular septum suggestive of RV pressure/volume overload.  The RV was severely dilated with moderately decreased systolic function.  Severe LAE, no thrombus seen.  The LA appendage appears to have been ligated.  Moderate right atrial enlargement.  No PFO/ASD by color doppler.  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  Peak RV-RA gradient 25 mmHg.  Status post mitral valve replacement with bioprosthetic valve.  Trivial MR.  Mean gradient 6 mmHg across valve,  does not appear to have significant stenosis.  Trileaflet aortic valve with moderate aortic regurgitation, no significant stenosis.  Normal caliber aorta with grade III plaque in descending thoracic aorta.   Objective:   Weight Range: 76.4 kg (168 lb 6.9 oz) Body mass index is 28.03 kg/m.   Vital Signs:   Temp:  [97.9 F (36.6 C)-101.5 F (38.6 C)] 97.9 F (36.6 C) (09/08 1144) Pulse Rate:  [78-120] 92 (09/08 1144) Resp:  [14-20] 18 (09/08 1144) BP: (95-120)/(53-97) 95/53 (09/08 1144) SpO2:  [98 %-100 %] 100 % (09/08 1144) Weight:  [76.4 kg (168 lb 6.9 oz)-80.1 kg (176 lb 9.4 oz)] 76.4 kg (168 lb 6.9 oz) (09/08 1144) Last BM Date: 07/24/17  Weight change: Filed Weights   07/25/17 0613 07/25/17 0717 07/25/17 1144  Weight: 79.5 kg (175 lb 3.2 oz) 80.1 kg (176 lb 9.4 oz) 76.4 kg (168 lb 6.9 oz)    Intake/Output:   Intake/Output Summary (Last 24 hours) at 07/25/17 1220 Last data filed at 07/25/17 1144  Gross per 24 hour  Intake             1394 ml  Output             3782 ml  Net            -2388 ml      Physical Exam   General: Fatigued appearing. NAD.  HEENT: Normal anicteric Neck: Supple. JVP 8-9 cm.. Carotids 2+ bilat; no bruits. No thyromegaly or nodule noted. Cor: PMI nondisplaced. IRR, IRR  2/6 TR  Lungs: CTAB, no wheeze Abdomen: Soft, non-tender, non-distended, no HSM. No bruits or masses. +BS  Extremities: No cyanosis, clubbing, or rash. Trace to 1+ edema LUE less swollen AVF ok. Left wrist still warm but much less tender and swollen. R knee imrpoved Neuro: alert & oriented x 3, cranial nerves grossly intact. moves all 4 extremities w/o difficulty. Affect pleasant   Telemetry   Afib 70-80s, personally reviewed.   Labs    CBC  Recent Labs  07/24/17 0448 07/25/17 0757  WBC 6.9 6.8  HGB 9.4* 8.8*  HCT 29.9* 29.0*  MCV 88.7 88.1  PLT 177 627*   Basic Metabolic Panel  Recent Labs  07/24/17 1437 07/25/17 0757  NA 132* 129*  129*  K 4.3 4.8   4.8  CL 94* 94*  95*  CO2 30 27  27   GLUCOSE 98 201*  204*  BUN 23* 23*  23*  CREATININE 2.78* 2.80*  2.81*  CALCIUM 8.6* 8.6*  8.7*  PHOS 3.8 3.7   Liver Function Tests  Recent Labs  07/24/17 1437 07/25/17 0757  ALBUMIN 3.1* 3.0*   No results for input(s): LIPASE, AMYLASE in the last 72 hours. Cardiac Enzymes No results for input(s): CKTOTAL, CKMB, CKMBINDEX, TROPONINI in the last 72 hours.  BNP: BNP (last 3 results)  Recent Labs  10/05/16 0147 05/30/17 1208 06/25/17 1253  BNP 237.4* 1,404.9* 875.7*    ProBNP (last 3 results)  Recent Labs  03/16/17 1401  PROBNP 6,530*   Imaging   Transthoracic Echocardiography 06/01/17  Study Conclusions  - Left ventricle: The cavity size was normal. There was mild   concentric hypertrophy. Systolic function was normal. The   estimated ejection fraction was in the range of 50% to 55%. Wall   motion was normal; there were no regional wall motion   abnormalities. - Ventricular septum: The contour showed diastolic flattening. - Aortic valve: Transvalvular velocity was minimally increased.   There was no stenosis. There was mild regurgitation. Peak   velocity (S): 201 cm/s. - Mitral valve: A bioprosthesis was present and functioning   normally. Mean gradient (D): 6 mm Hg. Valve area by pressure   half-time: 1.93 cm^2. - Left atrium: The atrium was severely dilated. - Right ventricle: The cavity size was moderately dilated. Wall   thickness was normal. Systolic function was moderately reduced. - Right atrium: The atrium was moderately dilated. - Tricuspid valve: There was severe regurgitation. - Pulmonary arteries: Systolic pressure was moderately increased.   PA peak pressure: 54 mm Hg (S).  RIGHT HEART CATH 06/29/17 RA mean 27 RV 63/8, mean 20 PA 58/28, mean 40 PCWP mean 28  Oxygen saturations: PA 69% AO 96%  Cardiac Output (Fick) 9.82  Cardiac Index (Fick) 4.96 PVR 1.2 WU  Medications:      Scheduled Medications: . acetaminophen  650 mg Oral TID  . atorvastatin  20 mg Oral Daily  . calcium acetate  667 mg Oral TID WC  . Darbepoetin Alfa      . darbepoetin (ARANESP) injection - NON-DIALYSIS  200 mcg Subcutaneous Q Sat-1800  . docusate sodium  100 mg Oral BID  . feeding supplement (NEPRO CARB STEADY)  237 mL Oral q morning - 10a  . insulin aspart  0-5 Units Subcutaneous QHS  . insulin aspart  0-9 Units Subcutaneous TID WC  . metoCLOPramide (REGLAN) injection  10 mg Intravenous Once  . metoprolol succinate  25 mg Oral BID  . midodrine  5 mg Oral  TID WC  . pantoprazole  40 mg Oral Daily  . polyethylene glycol  17 g Oral Daily  . predniSONE  40 mg Oral Q breakfast  . sodium chloride  2 spray Each Nare BID  . sodium chloride flush  3 mL Intravenous Q12H  . Warfarin - Pharmacist Dosing Inpatient   Does not apply q1800    Infusions: . sodium chloride 0  (07/01/17 1500)  . sodium chloride Stopped (07/06/17 0814)  . sodium chloride    . sodium chloride    . ferric gluconate (FERRLECIT/NULECIT) IV Stopped (07/24/17 0917)  . Vancomycin    . 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 Patient underwent mitral valve replacement and tricuspid valve repair in [place] in 2012.    Assessment/Plan   1. Acute on chronic diastolic CHF/RV failure: EF 50-55%  on echo in 7/18 with prominent RV failure (enlarged/hypokinetic RV with D-shaped interventricular septum), severe TR.  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  Milrinone was started at admission for RV support, stopped now that he is getting RRT.  CVVH stopped 8/21 and has been getting regular iHD since then.   - Volume status improving with HD. Discussed with renal NP. Will plan 1 more session of HD Monday am then d/c as he still has some active issues to resolve in house prior to d/c (particularly gout) - Accepted at [facility] for outpatient HD. Per Dr. Florene Glen, plan to increase frequency to 4 times weekly.  2. ARF on CKD stage IV: Now dialysis dependent. - Significant RV failure will complicate this but he has done well with it so far.  He lives alone and doesn't have much help with his healthcare so chance at heart-kidney transplant would be very low. [facility] has seen. He had tunneled catheter and AV fistula 07/13/17.  - BPs soft despite midodrine  Tolerated HD well today.  Plan as above - Will need HD 4days per week as outpatient.  3. Atrial fibrillation: Persistent.  Has been in atrial fibrillation for a number of months. S/p TEE-guided DCCV on 8/23.  Back in afib on 8/24 and has stayed in afib. HR stable.   - Would not repeat DCCV.   - Continue Toprol XL 25 mg bid, now off amiodarone.    - INR 1.77  Dosing per pharm. He is getting heparin with HD which may need enough coverage.  4. Anemia: He has had BRBPR but not in the last few days.  Transfusion this admission. GI appreciated. S/P EGD/Colonoscopy 8/30 with a 2 polyps removed and internal hemorrhoids. - Received PRBC on 8/30 for hgb 6.6. Todays hgb 8.8. Continue warfarin.  5. Bioprosthetic mitral valve: Stable appearance on 7/18 echo.  No change.  6. Probable sleep apnea: Will need sleep study outpatient. No change.  7. Acute  gout in left wrist and right knee - continue prednisone today is day 2/3. Start allopurinol tomorrow  8. Low  back pain: Ongoing with slow improvement.  CT did not show RP hematoma or other cause for back pain. Colonoscopy was OK.  9. Fever - likely due to gout. On vanc now. If cx remain negative will stop on Monday 9. Disposition:  - Possible d/c Monday if acute issues resolved. Will follow up with Dr. Harrington Challenger.  - Will need coumadin follow up. Will need to schedule once HD schedule confirmed.    Length of Stay: 28  Glori Bickers, MD  07/25/2017, 12:20 PM  Advanced Heart Failure Team Pager [phone] (M-F; 7a - 4p)  Please contact [facility] for night-coverage after hours (4p -7a ) and weekends on [email or web address]

## 2017-07-25 NOTE — Progress Notes (Signed)
Orders for blood cultures to be draw (already drawn), added blood culture draw from hemodialysis catheter placed by vascular physician- IV team called and RN states that blood culture can not be drawn unless cleared by nephrologist.

## 2017-07-25 NOTE — Progress Notes (Signed)
Patient educated about fluid restriction and new medications that were due tonight. Patient agrees to plan and understands.

## 2017-07-26 LAB — CBC
HEMATOCRIT: 30 % — AB (ref 39.0–52.0)
HEMOGLOBIN: 9.3 g/dL — AB (ref 13.0–17.0)
MCH: 27 pg (ref 26.0–34.0)
MCHC: 31 g/dL (ref 30.0–36.0)
MCV: 87 fL (ref 78.0–100.0)
Platelets: 170 10*3/uL (ref 150–400)
RBC: 3.45 MIL/uL — ABNORMAL LOW (ref 4.22–5.81)
RDW: 21.3 % — AB (ref 11.5–15.5)
WBC: 8 10*3/uL (ref 4.0–10.5)

## 2017-07-26 LAB — GLUCOSE, CAPILLARY
GLUCOSE-CAPILLARY: 163 mg/dL — AB (ref 65–99)
Glucose-Capillary: 198 mg/dL — ABNORMAL HIGH (ref 65–99)
Glucose-Capillary: 126 mg/dL — ABNORMAL HIGH (ref 65–99)
Glucose-Capillary: 204 mg/dL — ABNORMAL HIGH (ref 65–99)

## 2017-07-26 LAB — URINE CULTURE: Culture: NO GROWTH

## 2017-07-26 LAB — BASIC METABOLIC PANEL
ANION GAP: 8 (ref 5–15)
BUN: 26 mg/dL — AB (ref 6–20)
CALCIUM: 8.7 mg/dL — AB (ref 8.9–10.3)
CO2: 28 mmol/L (ref 22–32)
Chloride: 95 mmol/L — ABNORMAL LOW (ref 101–111)
Creatinine, Ser: 3.04 mg/dL — ABNORMAL HIGH (ref 0.61–1.24)
GFR calc Af Amer: 25 mL/min — ABNORMAL LOW (ref >60)
GFR calc non Af Amer: 21 mL/min — ABNORMAL LOW (ref >60)
GLUCOSE: 128 mg/dL — AB (ref 65–99)
POTASSIUM: 4.6 mmol/L (ref 3.5–5.1)
Sodium: 131 mmol/L — ABNORMAL LOW (ref 135–145)

## 2017-07-26 LAB — PROTIME-INR
INR: 2.25
PROTHROMBIN TIME: 24.7 s — AB (ref 11.4–15.2)

## 2017-07-26 MED ORDER — ALLOPURINOL 300 MG PO TABS
150.0000 mg | ORAL_TABLET | Freq: Every day | ORAL | Status: DC
  Administered 2017-07-26 – 2017-07-27 (×2): 150 mg via ORAL
  Filled 2017-07-26 (×2): qty 1

## 2017-07-26 MED ORDER — WARFARIN SODIUM 2.5 MG PO TABS
2.5000 mg | ORAL_TABLET | Freq: Once | ORAL | Status: AC
  Administered 2017-07-26: 2.5 mg via ORAL
  Filled 2017-07-26: qty 1

## 2017-07-26 NOTE — Progress Notes (Signed)
Patient ID: Michael [name], male   DOB: [birth year], 56 y.o.   MRN: [id number]      Advanced Heart Failure Rounding Note  Primary Cardiologist: Dr. Harrington Challenger HF Cardiology: Aundra Dubin  Subjective:    Started CVVHD 8/15 and stopped milrinone. CVVH stopped 8/21. On midodrine with stable BP now.  With ongoing volume overload, getting daily HD.   On 8/23, he had TEE-guided DCCV to NSR.  Reverted back to AF 8/24  He had CT abdomen/pelvis 8/27: no retroperitoneal hematoma or other cause of fall in hemoglobin or back pain.   8/30 S/P EGD and Colonoscopy. 2 polyps removed.   Developed painful left wrist and right knee on 9/7 with fever to 101. Bcx drawn. Started on vanc. Prednisone also started due to probable gout flare  Feels much better today. Denies dyspnea. No fevers. Cultures remain negative. U/s  LUE negative   Studies: Echo (7/18): EF 50-55%, mild LVH, mild AI, bioprosthetic mitral valve appeared to function normally, RV moderately dilated with moderately decreased systolic function, D-shaped interventricular septum suggestive of RV pressure/volume overload, severe TR, PASP 54 mmHg  [facility] Procedural Findings: Hemodynamics (mmHg) RA mean 27 RV 63/8, mean 20 PA 58/28, mean 40 PCWP mean 28 Oxygen saturations: PA 69% AO 96% Cardiac Output (Fick) 9.82  Cardiac Index (Fick) 4.96 PVR 1.2 WU  TEE (8/23): Normal LV size and systolic function, EF 29-92%.  Normal wall thickness and motion.  D-shaped interventricular septum suggestive of RV pressure/volume overload.  The RV was severely dilated with moderately decreased systolic function.  Severe LAE, no thrombus seen.  The LA appendage appears to have been ligated.  Moderate right atrial enlargement.  No PFO/ASD by color doppler.  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  Peak RV-RA gradient 25 mmHg.  Status post mitral valve replacement with bioprosthetic valve.  Trivial MR.  Mean gradient 6 mmHg across valve, does not appear to  have significant stenosis.  Trileaflet aortic valve with moderate aortic regurgitation, no significant stenosis.  Normal caliber aorta with grade III plaque in descending thoracic aorta.   Objective:   Weight Range: 173 lb 8 oz (78.7 kg) Body mass index is 28.87 kg/m.   Vital Signs:   Temp:  [98.6 F (37 C)-99.3 F (37.4 C)] 98.6 F (37 C) (09/09 0500) Pulse Rate:  [66-90] 86 (09/09 0940) Resp:  [18] 18 (09/09 0500) BP: (90-105)/(56-65) 95/62 (09/09 0940) SpO2:  [99 %-100 %] 99 % (09/09 0940) Weight:  [173 lb 8 oz (78.7 kg)] 173 lb 8 oz (78.7 kg) (09/09 0500) Last BM Date: 07/25/17  Weight change: Filed Weights   07/25/17 0717 07/25/17 1144 07/26/17 0500  Weight: 176 lb 9.4 oz (80.1 kg) 168 lb 6.9 oz (76.4 kg) 173 lb 8 oz (78.7 kg)    Intake/Output:   Intake/Output Summary (Last 24 hours) at 07/26/17 1152 Last data filed at 07/26/17 0600  Gross per 24 hour  Intake             1400 ml  Output                0 ml  Net             1400 ml      Physical Exam   General: Sitting up in bed HEENT: Normal anicteric Neck: Supple. JVP to jaw.. Carotids 2+ bilat; no bruits. No thyromegaly or nodule noted. Cor: PMI nondisplaced. IRR. IRR 2/6 TR Lungs: clear  Abdomen: obese soft NT/NT good BS  Extremities: no cyanosis, clubbing, rash, 2+ edema LUE AVF with thrill L wrist ok  Neuro: alert & oriented x 3, cranial nerves grossly intact. moves all 4 extremities w/o difficulty. Affect pleasant    Telemetry   Afib 70s Personally reviewed   Labs    CBC  Recent Labs  07/25/17 0757 07/26/17 0509  WBC 6.8 8.0  HGB 8.8* 9.3*  HCT 29.0* 30.0*  MCV 88.1 87.0  PLT 145* 782   Basic Metabolic Panel  Recent Labs  07/24/17 1437 07/25/17 0757 07/26/17 0509  NA 132* 129*  129* 131*  K 4.3 4.8  4.8 4.6  CL 94* 94*  95* 95*  CO2 30 27  27 28   GLUCOSE 98 201*  204* 128*  BUN 23* 23*  23* 26*  CREATININE 2.78* 2.80*  2.81* 3.04*  CALCIUM 8.6* 8.6*  8.7* 8.7*    PHOS 3.8 3.7  --    Liver Function Tests  Recent Labs  07/24/17 1437 07/25/17 0757  ALBUMIN 3.1* 3.0*   No results for input(s): LIPASE, AMYLASE in the last 72 hours. Cardiac Enzymes No results for input(s): CKTOTAL, CKMB, CKMBINDEX, TROPONINI in the last 72 hours.  BNP: BNP (last 3 results)  Recent Labs  10/05/16 0147 05/30/17 1208 06/25/17 1253  BNP 237.4* 1,404.9* 875.7*    ProBNP (last 3 results)  Recent Labs  03/16/17 1401  PROBNP 6,530*   Imaging   Transthoracic Echocardiography 06/01/17  Study Conclusions  - Left ventricle: The cavity size was normal. There was mild   concentric hypertrophy. Systolic function was normal. The   estimated ejection fraction was in the range of 50% to 55%. Wall   motion was normal; there were no regional wall motion   abnormalities. - Ventricular septum: The contour showed diastolic flattening. - Aortic valve: Transvalvular velocity was minimally increased.   There was no stenosis. There was mild regurgitation. Peak   velocity (S): 201 cm/s. - Mitral valve: A bioprosthesis was present and functioning   normally. Mean gradient (D): 6 mm Hg. Valve area by pressure   half-time: 1.93 cm^2. - Left atrium: The atrium was severely dilated. - Right ventricle: The cavity size was moderately dilated. Wall   thickness was normal. Systolic function was moderately reduced. - Right atrium: The atrium was moderately dilated. - Tricuspid valve: There was severe regurgitation. - Pulmonary arteries: Systolic pressure was moderately increased.   PA peak pressure: 54 mm Hg (S).  RIGHT HEART CATH 06/29/17 RA mean 27 RV 63/8, mean 20 PA 58/28, mean 40 PCWP mean 28  Oxygen saturations: PA 69% AO 96%  Cardiac Output (Fick) 9.82  Cardiac Index (Fick) 4.96 PVR 1.2 WU  Medications:     Scheduled Medications: . acetaminophen  650 mg Oral TID  . atorvastatin  20 mg Oral Daily  . calcium acetate  667 mg Oral TID WC  . darbepoetin  (ARANESP) injection - NON-DIALYSIS  200 mcg Subcutaneous Q Sat-1800  . docusate sodium  100 mg Oral BID  . feeding supplement (NEPRO CARB STEADY)  237 mL Oral q morning - 10a  . insulin aspart  0-5 Units Subcutaneous QHS  . insulin aspart  0-9 Units Subcutaneous TID WC  . metoCLOPramide (REGLAN) injection  10 mg Intravenous Once  . metoprolol succinate  25 mg Oral BID  . midodrine  5 mg Oral TID WC  . pantoprazole  40 mg Oral Daily  . polyethylene glycol  17 g Oral Daily  . predniSONE  40 mg  Oral Q breakfast  . sodium chloride  2 spray Each Nare BID  . sodium chloride flush  3 mL Intravenous Q12H  . Warfarin - Pharmacist Dosing Inpatient   Does not apply q1800    Infusions: . sodium chloride 0  (07/01/17 1500)  . sodium chloride Stopped (07/06/17 0814)  . ferric gluconate (FERRLECIT/NULECIT) IV Stopped (07/24/17 0917)  . 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 Patient underwent mitral valve replacement and tricuspid valve repair in [place] in 2012.    Assessment/Plan   1. Acute on chronic diastolic CHF/RV failure: EF 50-55% on echo in 7/18 with prominent RV failure (enlarged/hypokinetic RV with D-shaped interventricular septum), severe TR.  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  Milrinone was started at admission for RV support, stopped now that he is getting RRT.   CVVH stopped 8/21 and has been getting regular iHD since then.   - Volume status improving with HD but still elevated. Discussed with renal NP. Will plan 1 more session of HD Monday am then d/c. They will notify outpatient HD center. \ - Accepted at [facility] for outpatient HD. Per Dr. Florene Glen, plan to increase frequency to 4 times weekly.  2. ARF on CKD stage IV: Now dialysis dependent. - Significant RV failure will complicate this but he has done well with it so far.  He lives alone and doesn't have much help with his healthcare so chance at heart-kidney transplant would be very low. [facility] has seen. He had tunneled catheter and AV fistula 07/13/17.  - BPs soft despite midodrine  Tolerated HD well yesterday  Plan as above - Will need HD 4days per week as outpatient.  - ? If PD is better long-term option 3. Atrial fibrillation: Persistent.  Has been in atrial fibrillation for a number of months. S/p TEE-guided DCCV on 8/23.  Back in afib on 8/24 and has stayed in afib. HR stable.   - Would not repeat DCCV.   - Continue Toprol XL 25 mg bid, now off amiodarone.    - INR 2.25  Dosing per pharm.  4. Anemia: He has had BRBPR but not in the last few days.  Transfusion this admission. GI appreciated. S/P EGD/Colonoscopy 8/30 with a 2 polyps removed and internal hemorrhoids. - Received PRBC on 8/30 for hgb 6.6. Todays hgb 9.3. Continue warfarin.  5. Bioprosthetic mitral valve: Stable appearance on 7/18 echo.  No change.  6. Probable sleep apnea: Will need sleep study outpatient. No change.  7. Acute gout (vs CPPD) in left wrist and right knee - continue prednisone today is day 3/3. Start allopurinol today 8. Low back pain: Ongoing with slow improvement.  CT did not show RP hematoma or other cause for back pain. Colonoscopy was OK.  9. Fever - likely due to gout. On vanc now. Cx negative. Will stop vanc  9. Disposition:  - Likely d/c tomorrow after HD. Will follow up with Dr. Harrington Challenger.  -  Will need  coumadin follow up. Will need to schedule once HD schedule confirmed.    Length of Stay: 29  Glori Bickers, MD  07/26/2017, 11:52 AM  Advanced Heart Failure Team Pager [phone] (M-F; 7a - 4p)  Please contact [facility] for night-coverage after hours (4p -7a ) and weekends on [email or web address]

## 2017-07-26 NOTE — Progress Notes (Signed)
His weight is down, he feels better. Cardiology has requested inpt HD on Monday.  He will have OP HD MWFS at [facility] Exam c/w less fuid. Will plan for HD for Monday. Michael [name] C

## 2017-07-26 NOTE — Progress Notes (Signed)
Patient is concerned that when he goes home he is on a lot of new medications and is wondering if he can get a home health RN to do a pill box for him.  This RN left a sticky note for case management regarding above.

## 2017-07-26 NOTE — Progress Notes (Signed)
ANTICOAGULATION CONSULT NOTE - Follow Up Consult  Pharmacy Consult for Warfarin Indication: atrial fibrillation  No Known Allergies  Labs:  Recent Labs  07/24/17 0448 07/24/17 1437 07/25/17 0757 07/26/17 0509  HGB 9.4*  --  8.8* 9.3*  HCT 29.9*  --  29.0* 30.0*  PLT 177  --  145* 170  LABPROT 19.7*  --  20.5* 24.7*  INR 1.69  --  1.77 2.25  CREATININE 2.74* 2.78* 2.80*  2.81* 3.04*    Estimated Creatinine Clearance: 25.9 mL/min (A) (by C-G formula based on SCr of 3.04 mg/dL (H)).   Assessment: Pt on warfarin PTA for afib/MVR (bioprosthetic). Warfarin held pending HD access placement and started on heparin bridge. S/P DCCV for Afib 8/23 and underwent fistula and TDC placement 8/27. Heparin and warfarin resumed s/p EGD and colonoscopy 8/30. Heparin stopped 9/4. INR now up 1.79>2.55 (large jump likely due to 5mg  dose on 9/7). Amiodarone discontinued 9/2. CBC stable. No bleed documented.  Possible d/c 9/10 per Cardiology notes.   PTA warfarin dose:  2.5 mg daily  Goal of Therapy:  INR 2-3 Monitor platelets by anticoagulation protocol: Yes   Plan:  Warfarin 2.5 mg x 1 dose again tonight Monitor Daily INR, CBC, s/sx bleeding   Elicia Lamp, PharmD, BCPS Clinical Pharmacist Rx Phone # for today: [phone] After 3:30PM, please call Main Rx: #[id number] 07/26/2017 12:08 PM

## 2017-07-26 NOTE — Progress Notes (Signed)
[place] arm unremarkable. Left hand much improved.  Swelling resolved. + thrill in fistula He has follow up with Dr Donzetta Matters.  Call if questions  Ruta Hinds, MD [facility] Office: [phone] Pager: [phone]

## 2017-07-26 NOTE — Progress Notes (Signed)
Patient without complaint during 7 a to 7 p shift, able to walk in halls x 2 using walker without leg pain.  Patient is excited about possible DC on 07/27/17.

## 2017-07-27 ENCOUNTER — Other Ambulatory Visit: Payer: Self-pay

## 2017-07-27 DIAGNOSIS — N184 Chronic kidney disease, stage 4 (severe): Secondary | ICD-10-CM

## 2017-07-27 LAB — BASIC METABOLIC PANEL
ANION GAP: 11 (ref 5–15)
BUN: 49 mg/dL — ABNORMAL HIGH (ref 6–20)
CO2: 26 mmol/L (ref 22–32)
Calcium: 8.9 mg/dL (ref 8.9–10.3)
Chloride: 92 mmol/L — ABNORMAL LOW (ref 101–111)
Creatinine, Ser: 4.46 mg/dL — ABNORMAL HIGH (ref 0.61–1.24)
GFR, EST AFRICAN AMERICAN: 16 mL/min — AB (ref >60)
GFR, EST NON AFRICAN AMERICAN: 13 mL/min — AB (ref >60)
Glucose, Bld: 100 mg/dL — ABNORMAL HIGH (ref 65–99)
POTASSIUM: 4.8 mmol/L (ref 3.5–5.1)
SODIUM: 129 mmol/L — AB (ref 135–145)

## 2017-07-27 LAB — CBC
HEMATOCRIT: 30.3 % — AB (ref 39.0–52.0)
HEMOGLOBIN: 9.7 g/dL — AB (ref 13.0–17.0)
MCH: 27.6 pg (ref 26.0–34.0)
MCHC: 32 g/dL (ref 30.0–36.0)
MCV: 86.1 fL (ref 78.0–100.0)
Platelets: 214 10*3/uL (ref 150–400)
RBC: 3.52 MIL/uL — ABNORMAL LOW (ref 4.22–5.81)
RDW: 20.9 % — AB (ref 11.5–15.5)
WBC: 9.6 10*3/uL (ref 4.0–10.5)

## 2017-07-27 LAB — GLUCOSE, CAPILLARY
Glucose-Capillary: 88 mg/dL (ref 65–99)
Glucose-Capillary: 197 mg/dL — ABNORMAL HIGH (ref 65–99)

## 2017-07-27 LAB — PROTIME-INR
INR: 2.66
PROTHROMBIN TIME: 28.1 s — AB (ref 11.4–15.2)

## 2017-07-27 MED ORDER — CALCIUM ACETATE (PHOS BINDER) 667 MG PO CAPS: 667.0000 mg | ORAL_CAPSULE | Freq: Three times a day (TID) | ORAL | 3 refills | Status: DC

## 2017-07-27 MED ORDER — DOCUSATE SODIUM 100 MG PO CAPS: 100.0000 mg | ORAL_CAPSULE | Freq: Two times a day (BID) | ORAL | 0 refills | Status: DC

## 2017-07-27 MED ORDER — TRAMADOL HCL 50 MG PO TABS: 50.0000 mg | ORAL_TABLET | Freq: Two times a day (BID) | ORAL | 2 refills | Status: DC | PRN

## 2017-07-27 MED ORDER — WARFARIN SODIUM 2.5 MG PO TABS
2.5000 mg | ORAL_TABLET | Freq: Every day | ORAL | Status: DC
  Filled 2017-07-27: qty 1

## 2017-07-27 MED ORDER — ALLOPURINOL 300 MG PO TABS: 150.0000 mg | ORAL_TABLET | Freq: Every day | ORAL | 3 refills | Status: DC

## 2017-07-27 MED ORDER — WARFARIN SODIUM 2.5 MG PO TABS: 2.5000 mg | ORAL_TABLET | Freq: Every day | ORAL | 0 refills | Status: DC

## 2017-07-27 MED ORDER — DARBEPOETIN ALFA 200 MCG/0.4ML IJ SOSY: 200.0000 ug | PREFILLED_SYRINGE | INTRAMUSCULAR | Status: DC

## 2017-07-27 MED ORDER — HYDROXYZINE HCL 10 MG PO TABS: 10.0000 mg | ORAL_TABLET | Freq: Four times a day (QID) | ORAL | 0 refills | Status: DC | PRN

## 2017-07-27 MED ORDER — ATORVASTATIN CALCIUM 20 MG PO TABS: 20.0000 mg | ORAL_TABLET | Freq: Every day | ORAL | 3 refills | Status: DC

## 2017-07-27 MED ORDER — METOPROLOL SUCCINATE ER 25 MG PO TB24: 25.0000 mg | ORAL_TABLET | Freq: Two times a day (BID) | ORAL | 6 refills | Status: DC

## 2017-07-27 MED ORDER — MIDODRINE HCL 5 MG PO TABS: 5.0000 mg | ORAL_TABLET | Freq: Three times a day (TID) | ORAL | 6 refills | Status: DC

## 2017-07-27 MED FILL — traMADol HCL 50 MG TABS: 50 | 15 days supply | Qty: 60 | Fill #0

## 2017-07-27 NOTE — Progress Notes (Signed)
Pt discharge education provided at bedside with pt and pt son. Pt IV discontinued, catheter intact and telemetry removed. Pt has all belongings including printed prescriptions. Pt discharged via wheelchair with nurse staff

## 2017-07-27 NOTE — Progress Notes (Signed)
ANTICOAGULATION CONSULT NOTE - Follow Up Consult  Pharmacy Consult for Warfarin Indication: atrial fibrillation  No Known Allergies  Labs:  Recent Labs  07/25/17 0757 07/26/17 0509 07/27/17 0416  HGB 8.8* 9.3* 9.7*  HCT 29.0* 30.0* 30.3*  PLT 145* 170 214  LABPROT 20.5* 24.7* 28.1*  INR 1.77 2.25 2.66  CREATININE 2.80*  2.81* 3.04* 4.46*    Estimated Creatinine Clearance: 18 mL/min (A) (by C-G formula based on SCr of 4.46 mg/dL (H)).   Assessment: Pt on warfarin PTA for afib/MVR (bioprosthetic). S/P DCCV for Afib 8/23 and underwent fistula and TDC placement 8/27. Heparin and warfarin resumed s/p EGD and colonoscopy 8/30. Heparin stopped 9/4.   INR trending up nicely to 2.66 today. CBC stable. No bleed documented.  Amiodarone discontinued 9/2. Plan d/c today  PTA warfarin dose:  2.5 mg daily  Goal of Therapy:  INR 2-3 Monitor platelets by anticoagulation protocol: Yes   Plan:  Resume home dose of coumadin at discharge>>2.5mg  daily INR follow up scheduled for Thursday  Erin Hearing PharmD., BCPS Clinical Pharmacist Pager [phone] 07/27/2017 9:44 AM

## 2017-07-27 NOTE — Progress Notes (Signed)
PT Cancellation Note  Patient Details Name: Michael [name] MRN: [id number] DOB: December 08, 1960   Cancelled Treatment:    Reason Eval/Treat Not Completed: Patient at procedure or test/unavailable   DONAWERTH,KAREN 07/27/2017, 10:15 AM

## 2017-07-27 NOTE — Progress Notes (Signed)
DCP 1) Patient is being followed by [facility];  2)[facility] for CHF  3) [facility] is following patient also.   He does not qualify for SNF placement at this time; Support person - daughter

## 2017-07-27 NOTE — Progress Notes (Signed)
Paged MD to sign pt printed prescription  Awaiting call back

## 2017-07-27 NOTE — Progress Notes (Signed)
Patient ID: Michael [name], male   DOB: 08-Jul-1961, 56 y.o.   MRN: [id number]      Advanced Heart Failure Rounding Note  Primary Cardiologist: Dr. Harrington Challenger HF Cardiology: Aundra Dubin  Subjective:    Started CVVHD 8/15 and stopped milrinone. CVVH stopped 8/21. On midodrine with stable BP now.  With ongoing volume overload, getting daily HD.   On 8/23, he had TEE-guided DCCV to NSR.  Reverted back to AF 8/24  He had CT abdomen/pelvis 8/27: no retroperitoneal hematoma or other cause of fall in hemoglobin or back pain.   8/30 S/P EGD and Colonoscopy. 2 polyps removed.   Developed painful left wrist and right knee on 9/7 with fever to 101. Bcx drawn. Started on vancomycin. Prednisone also started due to probable gout flare.  Blood cultures negative, vancomycin stopped.  Remains afebrile, pain improved.   No dyspnea.  Seen at HD today, tolerating without issues.   Studies: Echo (7/18): EF 50-55%, mild LVH, mild AI, bioprosthetic mitral valve appeared to function normally, RV moderately dilated with moderately decreased systolic function, D-shaped interventricular septum suggestive of RV pressure/volume overload, severe TR, PASP 54 mmHg  [facility] Procedural Findings: Hemodynamics (mmHg) RA mean 27 RV 63/8, mean 20 PA 58/28, mean 40 PCWP mean 28 Oxygen saturations: PA 69% AO 96% Cardiac Output (Fick) 9.82  Cardiac Index (Fick) 4.96 PVR 1.2 WU  TEE (8/23): Normal LV size and systolic function, EF 39-76%.  Normal wall thickness and motion.  D-shaped interventricular septum suggestive of RV pressure/volume overload.  The RV was severely dilated with moderately decreased systolic function.  Severe LAE, no thrombus seen.  The LA appendage appears to have been ligated.  Moderate right atrial enlargement.  No PFO/ASD by color doppler.  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  Peak RV-RA gradient 25 mmHg.  Status post mitral valve replacement with bioprosthetic valve.  Trivial MR.  Mean  gradient 6 mmHg across valve, does not appear to have significant stenosis.  Trileaflet aortic valve with moderate aortic regurgitation, no significant stenosis.  Normal caliber aorta with grade III plaque in descending thoracic aorta.   Objective:   Weight Range: 180 lb 8.9 oz (81.9 kg) Body mass index is 30.05 kg/m.   Vital Signs:   Temp:  [97.3 F (36.3 C)-98 F (36.7 C)] 97.3 F (36.3 C) (09/10 0545) Pulse Rate:  [67-102] 80 (09/10 0800) Resp:  [11-20] 18 (09/10 0800) BP: (95-119)/(45-72) 97/68 (09/10 0800) SpO2:  [93 %-99 %] 96 % (09/10 0545) Weight:  [179 lb 8 oz (81.4 kg)-180 lb 8.9 oz (81.9 kg)] 180 lb 8.9 oz (81.9 kg) (09/10 0703) Last BM Date: 07/26/17  Weight change: Filed Weights   07/26/17 0500 07/27/17 0545 07/27/17 0703  Weight: 173 lb 8 oz (78.7 kg) 179 lb 8 oz (81.4 kg) 180 lb 8.9 oz (81.9 kg)    Intake/Output:   Intake/Output Summary (Last 24 hours) at 07/27/17 0830 Last data filed at 07/27/17 0500  Gross per 24 hour  Intake              840 ml  Output                2 ml  Net              838 ml      Physical Exam   General: NAD Neck: JVP 10-12 cm, no thyromegaly or thyroid nodule.  Lungs: Clear to auscultation bilaterally with normal respiratory effort. CV: Nondisplaced PMI.  Heart irregular S1/S2, no S3/S4, 2/6 SEM RUSB.  1+ edema to knees bilaterally.   Abdomen: Soft, nontender, no hepatosplenomegaly, no distention.  Skin: Intact without lesions or rashes.  Neurologic: Alert and oriented x 3.  Psych: Normal affect. Extremities: No clubbing or cyanosis.  HEENT: Normal.    Telemetry   Afib 70s-80s Personally reviewed   Labs    CBC  Recent Labs  07/26/17 0509 07/27/17 0416  WBC 8.0 9.6  HGB 9.3* 9.7*  HCT 30.0* 30.3*  MCV 87.0 86.1  PLT 170 774   Basic Metabolic Panel  Recent Labs  07/24/17 1437 07/25/17 0757 07/26/17 0509 07/27/17 0416  NA 132* 129*  129* 131* 129*  K 4.3 4.8  4.8 4.6 4.8  CL 94* 94*  95* 95* 92*    CO2 30 27  27 28 26   GLUCOSE 98 201*  204* 128* 100*  BUN 23* 23*  23* 26* 49*  CREATININE 2.78* 2.80*  2.81* 3.04* 4.46*  CALCIUM 8.6* 8.6*  8.7* 8.7* 8.9  PHOS 3.8 3.7  --   --    Liver Function Tests  Recent Labs  07/24/17 1437 07/25/17 0757  ALBUMIN 3.1* 3.0*   No results for input(s): LIPASE, AMYLASE in the last 72 hours. Cardiac Enzymes No results for input(s): CKTOTAL, CKMB, CKMBINDEX, TROPONINI in the last 72 hours.  BNP: BNP (last 3 results)  Recent Labs  10/05/16 0147 05/30/17 1208 06/25/17 1253  BNP 237.4* 1,404.9* 875.7*    ProBNP (last 3 results)  Recent Labs  03/16/17 1401  PROBNP 6,530*   Imaging   Transthoracic Echocardiography 06/01/17  Study Conclusions  - Left ventricle: The cavity size was normal. There was mild   concentric hypertrophy. Systolic function was normal. The   estimated ejection fraction was in the range of 50% to 55%. Wall   motion was normal; there were no regional wall motion   abnormalities. - Ventricular septum: The contour showed diastolic flattening. - Aortic valve: Transvalvular velocity was minimally increased.   There was no stenosis. There was mild regurgitation. Peak   velocity (S): 201 cm/s. - Mitral valve: A bioprosthesis was present and functioning   normally. Mean gradient (D): 6 mm Hg. Valve area by pressure   half-time: 1.93 cm^2. - Left atrium: The atrium was severely dilated. - Right ventricle: The cavity size was moderately dilated. Wall   thickness was normal. Systolic function was moderately reduced. - Right atrium: The atrium was moderately dilated. - Tricuspid valve: There was severe regurgitation. - Pulmonary arteries: Systolic pressure was moderately increased.   PA peak pressure: 54 mm Hg (S).  RIGHT HEART CATH 06/29/17 RA mean 27 RV 63/8, mean 20 PA 58/28, mean 40 PCWP mean 28  Oxygen saturations: PA 69% AO 96%  Cardiac Output (Fick) 9.82  Cardiac Index (Fick) 4.96 PVR 1.2  WU  Medications:     Scheduled Medications: . acetaminophen  650 mg Oral TID  . allopurinol  150 mg Oral Daily  . atorvastatin  20 mg Oral Daily  . calcium acetate  667 mg Oral TID WC  . darbepoetin (ARANESP) injection - NON-DIALYSIS  200 mcg Subcutaneous Q Sat-1800  . docusate sodium  100 mg Oral BID  . feeding supplement (NEPRO CARB STEADY)  237 mL Oral q morning - 10a  . insulin aspart  0-5 Units Subcutaneous QHS  . insulin aspart  0-9 Units Subcutaneous TID WC  . metoCLOPramide (REGLAN) injection  10 mg Intravenous Once  . metoprolol succinate  25 mg Oral BID  . midodrine  5 mg Oral TID WC  . pantoprazole  40 mg Oral Daily  . polyethylene glycol  17 g Oral Daily  . sodium chloride  2 spray Each Nare BID  . sodium chloride flush  3 mL Intravenous Q12H  . Warfarin - Pharmacist Dosing Inpatient   Does not apply q1800    Infusions: . sodium chloride 0  (07/01/17 1500)  . sodium chloride Stopped (07/06/17 0814)  . 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 Patient underwent mitral valve replacement and tricuspid valve repair in [place] in 2012.    Assessment/Plan   1. Acute on chronic diastolic CHF/RV failure: EF 50-55% on echo in 7/18 with prominent RV failure (enlarged/hypokinetic RV with D-shaped interventricular septum), severe TR.  It is possible that long-standing mitral valve disease prior to MVR led to pulmonary vascular changes  and pulmonary hypertension with RV failure.  Milrinone was started at admission for RV support, stopped now that he is getting RRT.  CVVH stopped 8/21 and has been getting regular iHD since then. Volume status improving with HD but still elevated.  Weight coming down. - Accepted at [facility] for [facility] HD. Per Dr. Florene Glen, plan to increase frequency to 4 times weekly.  2. AKI on CKD stage IV: Now dialysis dependent.  Significant RV failure will complicate this but he has done ok with it so far.  He lives alone and doesn't have much help with his healthcare so chance at heart-kidney transplant would be very low. [facility] has seen. He had tunneled catheter and AV fistula 07/13/17.  - Continue midodrine with soft BP.  - Will need HD 4 days per week as outpatient.  - ? If PD is better long-term option 3. Atrial fibrillation: Persistent.  Has been in atrial fibrillation for a number of months. S/p TEE-guided DCCV on 8/23.  Back in afib on 8/24 and has stayed in afib. HR stable.   - Would not repeat DCCV.   - Continue Toprol XL 25 mg bid, now off amiodarone.    - Continue warfarin, will need coumadin clinic followup.   4. Anemia: He has had BRBPR but not in the last few days.  Transfusion this admission. GI appreciated. S/P EGD/Colonoscopy 8/30 with a 2 polyps removed and internal hemorrhoids. - Received PRBC on 8/30 for hgb 6.6. Today's hgb 9.7. Continue warfarin.  5. Bioprosthetic mitral valve: Stable appearance on 7/18 echo.  No change.  6. Probable sleep apnea: Will need sleep study outpatient. No change.  7. Acute gout (vs CPPD) in left wrist and right knee - Continue allopurinol. - Can stop prednisone today.  8. Low back pain: Ongoing with slow improvement.  CT did not show RP hematoma or other cause for back pain. Colonoscopy was OK.  9. Fever: Resolved.  Likely due to gout. Blood cultures NGTD.  Now off vancomycin.  9. Disposition: D/c home today after HD.  Will follow up with Dr.  Harrington Challenger.  Will need coumadin clinic followup scheduled.  Will be new start  for outpatient HD at [facility].  Meds for home: Warfarin per coumadin clinic, Toprol XL 25 mg bid, midodrine 5 mg tid, atorvastatin 20 daily, noncardiac meds as prior to admission.   Length of Stay: [place], [place]  07/27/2017, 8:30 AM  Advanced Heart Failure Team Pager [phone] (M-F; 7a - 4p)  Please contact [facility] for night-coverage after hours (4p -7a ) and weekends on [email or web address]

## 2017-07-27 NOTE — Discharge Summary (Signed)
Advanced Heart Failure Discharge Note  Discharge Summary   Patient ID: Conn Trombetta MRN: [id number], DOB/AGE: 1961-10-18 56 y.o. Admit date: 06/25/2017 D/C date:     07/27/2017   Primary Discharge Diagnoses:  1. Acute on chronic diastolic CHF/RV failure 2. 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 3. Atrial fibrillation: Persistent.  4. Anemia 5. Bioprosthetic mitral valve 6. Probable sleep apnea 7. Acute gout (vs CPPD) in left wrist and right knee 8. Low back pain 9. 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 Patient underwent mitral valve replacement and tricuspid valve repair in [place] in 2012.     Pt admitted with marked volume overload and had poor diuresis and renal failure leading to initiation of CRRT and transition to IHD.  He is now ESRD. Problem based hospital course as follows.   1. Acute on chronic diastolic CHF/RV failure: EF 50-55% on echo in 7/18 with prominent RV failure (enlarged/hypokinetic RV with D-shaped interventricular septum), severe TR.  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  Milrinone started on admission for RV support, but weaned of once RRT started. CVVHD stopped 07/07/17 and transitioned to IHD. Not candidate for home milrinone with limited social support and ESRD.  - Accepted at [facility] for outpatient HD. Per Dr. Florene Glen, plan for frequency of 4x weekly.  2. AKI on CKD stage IV:  - ARF developed this admission in setting of significant RV failure. Initially trialled on CRRT and did not tolerate wean with poor urine production  and rising creatinine. VVS saw and placed AV fistula 07/13/17. He has a tunnelled catheter in place while this matures.  He will received HD M/W/F/S per renal. ? PD for long-term option.  3. Atrial fibrillation: Persistent. - Chronic prior to admission. He had TEE guided DCCV 07/09/17, but quickly went back into afib on 07/10/17. HR has been stable. Meds adjusted as tolerated. Started on coumadin and coumadin follow up made. Would not repeat DCCV.  4. Anemia: Pt had BRBPR this admission at one point with rapid rise in INR. Quickly resolved. Did received blood transfusion. GI consulted and now S/P EGD/Colonoscopy 8/30 with a 2 polyps removed and internal hemorrhoids. Further GI follow up as outpatient prn.  - Received PRBC on 8/30 for hgb 6.6. Today's hgb 9.7. Continue warfarin.  5. Bioprosthetic mitral valve: Stable appearance on 7/18 echo.  No change.  6. Probable sleep apnea: Plan for outpatient sleep study.  7. Acute gout (vs CPPD) in left wrist and right knee: Resolved with prednisone burst and allopurinol continued.  8. Low back pain: Ongoing with slow improvement. CT did not show bleeding or other cause of back pain. Colonoscopy was OK.  9. Fever: Treated with Vancomycin. Thought possibly due to gout flare. Blood cultures NGTD on discharge.   Pt had long and complicated hospital course. Discussed with renal that volume now adequately controlled on HD x 4 weekly.  He will be discharged to home today with follow up as below. He will follow up with Dr. Harrington Challenger as outpatient. He is not candidate for any advanced therapies with IHD. Volume per HD.   Discharge  Weight Range: 180 lbs Discharge Vitals: Blood pressure 96/64, pulse 67, temperature 97.7 F (36.5 C), temperature source Oral, resp. rate 14, height 5\' 5"  (1.651 m), weight 172 lb 6.4 oz (78.2 kg), SpO2 96 %.  Labs: Lab Results  Component Value Date   WBC 9.6 07/27/2017   HGB 9.7 (L) 07/27/2017   HCT 30.3 (L) 07/27/2017   MCV 86.1 07/27/2017     PLT 214 07/27/2017     Recent Labs Lab 07/27/17 0416  NA 129*  K 4.8  CL 92*  CO2 26  BUN 49*  CREATININE 4.46*  CALCIUM 8.9  GLUCOSE 100*   Lab Results  Component Value Date   CHOL 111 03/03/2017   HDL 36 (L) 03/03/2017   LDLCALC 62 03/03/2017   TRIG 66 03/03/2017   BNP (last 3 results)  Recent Labs  10/05/16 0147 05/30/17 1208 06/25/17 1253  BNP 237.4* 1,404.9* 875.7*    ProBNP (last 3 results)  Recent Labs  03/16/17 1401  PROBNP 6,530*     Diagnostic Studies/Procedures   No results found.  Discharge Medications   Allergies as of 07/27/2017   No Known Allergies     Medication List    STOP taking these medications   carvedilol 25 MG tablet Commonly known as:  COREG   furosemide 80 MG tablet Commonly known as:  LASIX   metoprolol tartrate 100 MG tablet Commonly known as:  LOPRESSOR     TAKE these medications   acetaminophen 650 MG CR tablet Commonly known as:  TYLENOL Take 650 mg by mouth every 6 (six) hours as needed for pain.   allopurinol 300 MG tablet Commonly known as:  ZYLOPRIM Take 0.5 tablets (150 mg total) by mouth daily.   atorvastatin 20 MG tablet Commonly known as:  LIPITOR Take 1 tablet (20 mg total) by mouth daily.   calcium acetate 667 MG capsule Commonly known as:  PHOSLO Take 1 capsule (667 mg total) by mouth 3 (three) times daily with meals.   colchicine 0.6 MG tablet Take 1.2 mg by mouth daily as needed for pain.   Darbepoetin Alfa 200 MCG/0.4ML Sosy injection Commonly known as:  ARANESP Inject 0.4 mLs (200 mcg total) into the skin every Saturday at 6 PM.   diclofenac sodium 1 % Gel Commonly known as:  VOLTAREN Apply 4 g topically 4 (four) times daily.   docusate sodium 100 MG capsule Commonly known as:  COLACE Take 1 capsule (100 mg total) by mouth 2 (two) times daily.   folic acid 1 MG tablet Commonly known as:  FOLVITE TAKE 1 TABLET BY MOUTH DAILY.   hydrOXYzine 10 MG tablet Commonly known as:   ATARAX/VISTARIL Take 1 tablet (10 mg total) by mouth every 6 (six) hours as needed for itching or anxiety (if itching unrelieved by benadryl).   Insulin Detemir 100 UNIT/ML Pen Commonly known as:  LEVEMIR FLEXTOUCH Inject 10 Units into the skin daily at 10 pm.   metoprolol succinate 25 MG 24 hr tablet Commonly known as:  TOPROL-XL Take 1 tablet (25 mg total) by mouth 2 (two) times daily.   midodrine 5 MG tablet Commonly known as:  PROAMATINE Take 1 tablet (5 mg total) by mouth 3 (three) times daily with meals.   omeprazole 20 MG capsule Commonly known as:  PRILOSEC Take 1 capsule (20 mg total) by mouth daily.   OXYGEN Inhale 2 L into the lungs daily.   traMADol 50 MG tablet Commonly known as:  ULTRAM Take 1-2  tablets (50-100 mg total) by mouth every 12 (twelve) hours as needed. What changed:  how much to take   warfarin 2.5 MG tablet Commonly known as:  COUMADIN Take 1 tablet (2.5 mg total) by mouth daily at 6 PM. Or as directed by coumadin clinic What changed:  how much to take  how to take this  when to take this  additional instructions            Durable Medical Equipment        Start     Ordered   07/27/17 0944  Heart failure home health orders  (Heart failure home health orders / Face to face)  Once    Comments:  Heart Failure Follow-up Care:  Verify follow-up appointments per Patient Discharge Instructions. Confirm transportation arranged. Reconcile home medications with discharge medication list. Remove discontinued medications from use. Assist patient/caregiver to manage medications using pill box. Reinforce low sodium food selection Assessments: Vital signs and oxygen saturation at each visit. Assess home environment for safety concerns, caregiver support and availability of low-sodium foods. Consult Education officer, museum, PT/OT, Dietitian, and CNA based on assessments. Perform comprehensive cardiopulmonary assessment. Notify MD for any change in  condition or weight gain of 3 pounds in one day or 5 pounds in one week with symptoms. Daily Weights and Symptom Monitoring: Ensure patient has access to scales. Teach patient/caregiver to weigh daily before breakfast and after voiding using same scale and record.    Teach patient/caregiver to track weight and symptoms and when to notify Provider. Activity: Develop individualized activity plan with patient/caregiver.  Any labs should go to Dr. [name] office on [facility].   PT/OT.     Pt has dialysis, so would be ideal if [facility] could eventually perform INR checks and send to coumadin clinic.  Question Answer Comment  Heart Failure Follow-up Care Advanced Heart Failure (AHF) Clinic at [phone]   Obtain the following labs Basic Metabolic Panel   Lab frequency Other see comments   Fax lab results to Other see comments   Diet Low Sodium Heart Healthy   Fluid restrictions: 2000 mL Fluid      07/27/17 0945       Discharge Care Instructions        Start     Ordered   08/01/17 0000  Darbepoetin Alfa (ARANESP) 200 MCG/0.4ML SOSY injection  Every Sat (1800)    Question:  Supervising Provider  Answer:  Glori Bickers R   07/27/17 1317   07/28/17 0000  allopurinol (ZYLOPRIM) 300 MG tablet  Daily    Question:  Supervising Provider  Answer:  Jolaine Artist   07/27/17 1317   07/28/17 0000  atorvastatin (LIPITOR) 20 MG tablet  Daily    Question:  Supervising Provider  Answer:  Glori Bickers R   07/27/17 1317   07/27/17 0000  calcium acetate (PHOSLO) 667 MG capsule  3 times daily with meals    Question:  Supervising Provider  Answer:  Glori Bickers R   07/27/17 1317   07/27/17 0000  docusate sodium (COLACE) 100 MG capsule  2 times daily    Question:  Supervising Provider  Answer:  Glori Bickers R   07/27/17 1317   07/27/17 0000  hydrOXYzine (ATARAX/VISTARIL) 10 MG tablet  Every 6 hours PRN    Question:  Supervising Provider  Answer:  Glori Bickers R    07/27/17 1317   07/27/17 0000  metoprolol succinate (TOPROL-XL) 25 MG 24 hr tablet  2 times  daily    Question:  Supervising Provider  Answer:  Jolaine Artist   07/27/17 1317   07/27/17 0000  midodrine (PROAMATINE) 5 MG tablet  3 times daily with meals    Question:  Supervising Provider  Answer:  Jolaine Artist   07/27/17 1317   07/27/17 0000  traMADol (ULTRAM) 50 MG tablet  Every 12 hours PRN    Question:  Supervising Provider  Answer:  Jolaine Artist   07/27/17 1317   07/27/17 0000  Diet - low sodium heart healthy     07/27/17 1317   07/27/17 0000  Increase activity slowly     07/27/17 1317   07/27/17 0000  STOP any activity that causes chest pain, shortness of breath, dizziness, sweating, or exessive weakness     07/27/17 1317   07/27/17 0000  Call MD for:  redness, tenderness, or signs of infection (pain, swelling, redness, odor or green/yellow discharge around incision site)     07/27/17 1317   07/27/17 0000  warfarin (COUMADIN) 2.5 MG tablet  Daily-1800    Question:  Supervising Provider  Answer:  Glori Bickers R   07/27/17 1318   07/15/17 0000  AMB Referral to [place] Management    Comments:  Please assign to [facility] LCSW for transportation needs. Currently at [facility]. Already active with [facility] RNCM. Written consent on file. [name], [place], [facility] Liaison-[phone]  Question Answer Comment  Reason for consult Please assign to [place] LCSW   Diagnoses of Kidney Failure   Diagnoses of Heart Failure   Expected date of contact 1-3 days (reserved for hospital discharges)      07/15/17 0955   06/26/17 0000  AMB Referral to [place] Management    Comments:  Please assign to [place] for transition of care and medication compliance, disease and symptom management for CHF, DM. Has multiple co-morbidities. Was followed by [place] prior to admit. Written consent obtained. Currently at [facility]. Will  have home health. PCP wants collaboration with [facility] as well for outpatient management. Please call with questions. [name], [place], [facility] [id number]-[phone]  Question Answer Comment  Reason for consult Please assign to [place]   Diagnoses of Diabetes   Diagnoses of Heart Failure   Expected date of contact 1-3 days (reserved for hospital discharges)      06/26/17 1227      Disposition   The patient will be discharged in stable but tenuous condition to home.  Discharge Instructions    AMB Referral to [place] Management    Complete by:  As directed    Please assign to [place] for transition of care and medication compliance, disease and symptom management for CHF, DM. Has multiple co-morbidities. Was followed by [place] prior to admit. Written consent obtained. Currently at [facility]. Will have home health. PCP wants collaboration with [place] as well for outpatient management. Please call with questions. [name], [place], [facility] [id number]-[phone]   Reason for consult:  Please assign to [place]   Diagnoses of:   Diabetes Heart Failure     Expected date of contact:  1-3 days (reserved for hospital discharges)   AMB Referral to [place] Management    Complete by:  As directed    Please assign to [facility] LCSW for transportation needs. Currently at [facility]. Already active with [facility] RNCM.  Written consent on file. [name], [place], RN,BSN-THN [facility] [id number]-[phone]   Reason for consult:  Please assign to [place] LCSW   Diagnoses of:   Kidney Failure Heart Failure     Expected date of contact:  1-3 days (reserved for hospital discharges)   Call MD for:  redness, tenderness, or signs of infection (pain, swelling, redness, odor or green/yellow discharge around incision site)    Complete by:  As directed    Diet - low sodium heart  healthy    Complete by:  As directed    Increase activity slowly    Complete by:  As directed    STOP any activity that causes chest pain, shortness of breath, dizziness, sweating, or exessive weakness    Complete by:  As directed      Follow-up Information    [place]. Go on 08/04/2017.   Why:  at 2:00pm for an appointment with Dr Lisette Grinder information: [place] [phone]       Waynetta Sandy, MD Follow up in 6 week(s).   Specialties:  Vascular Surgery, Cardiology Why:  Our office will call you to arrange an appointment  Contact information: [place] [address] [phone]        [place] Office Follow up on 07/30/2017.   Specialty:  Cardiology Why:  at 115 pm for post hospital INR (coumadin check).  Contact information: [address] [place], [facility] Follow up on 07/29/2017.   Why:  Please arrive at 1100 am for your next dialysis session.  Contact information: [place] [address] [phone]        Richardson Dopp T, PA-C Follow up on 08/11/2017.   Specialties:  Cardiology, Physician Assistant Why:  at [phone] for post-hospital follow up in Dr. Harrington Challenger' Office.  Contact information: [address] [place] [address] [phone]        Care, [facility] Follow up.   Specialty:  [place] Why:  They will do your home health care at your home Contact information: [place] [address] [phone]             Duration of Discharge Encounter: Greater than 35 minutes   Signed, Shirley Friar, PA-C 07/27/2017, 1:18 PM

## 2017-07-27 NOTE — Progress Notes (Signed)
Patient ID: Michael [name], male   DOB: 01-Mar-1961, 56 y.o.   MRN: [id number]  [facility] Progress Note   Assessment/ Plan:   1. Acute on chronic diastolic heart failure (EF 50-55%) with long-standing mitral valve disease. Vol status improving with 4x weekly HD 2. 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 Continue hemodialysis, planned for today.  He is CLIP'd to [facility] on a MWFS schedule. Having problems with IDH- on midodrine TID.   3. Anemia:Continue Aranesp for management of anemia, persistent iron deficiency noted with iron saturation 14%- IV started 8/29, planned for 8 doses.  Aranesp 200 mcg q saturday 4. CKD-MBD: Phosphorus levels improving with ongoing calcium acetate.  5. Atrial fibrillation:persistent and refractory to DCCV attempt in recent past. Ongoing anticoagulation with Coumadin,  Subjective:   Continues to have intermittent back pain but overall better and able to ambulate.  Objective:   BP (!) 119/45   Pulse 71   Temp (!) 97.3 F (36.3 C) (Oral)   Resp 17   Ht '5\' 5"'  (1.651 m)   Wt 81.9 kg (180 lb 8.9 oz)   SpO2 96%   BMI 30.05 kg/m   Physical Exam: Gen: Appears comfortable resting in bed MVE:HMCNO irregularly irregular, normal rate, S1 and S2 normal  Resp:Poor inspiratory effort with decreased breath sounds over bases. Clear anteriorly  BSJ:GGEZ, obese, nontender  Ext: 2+ LE edema. Venous stasis changes left leg >right leg. Left first stage BBF site intact with palpable thrill. Bruising noted over both upper extremities and right IJ TDC insertion site.  Labs: BMET  Recent Labs Lab 07/22/17 0546 07/23/17 0530 07/24/17 0448 07/24/17 1437 07/25/17 0757 07/26/17 0509 07/27/17 0416  NA 133* 134* 132* 132* 129*  129* 131* 129*  K 4.1 4.0 4.2 4.3 4.8  4.8 4.6 4.8  CL 96* 96* 94* 94* 94*  95* 95* 92*  CO2 '26 29 30 30 27  27 28 26  ' GLUCOSE 90 80 97 98 201*  204* 128* 100*  BUN 32* 24* 23* 23* 23*  23* 26*  49*  CREATININE 2.96* 2.71* 2.74* 2.78* 2.80*  2.81* 3.04* 4.46*  CALCIUM 8.7* 8.7* 8.7* 8.6* 8.6*  8.7* 8.7* 8.9  PHOS  --   --   --  3.8 3.7  --   --    CBC  Recent Labs Lab 07/24/17 0448 07/25/17 0757 07/26/17 0509 07/27/17 0416  WBC 6.9 6.8 8.0 9.6  HGB 9.4* 8.8* 9.3* 9.7*  HCT 29.9* 29.0* 30.0* 30.3*  MCV 88.7 88.1 87.0 86.1  PLT 177 145* 170 214   Medications:    . acetaminophen  650 mg Oral TID  . allopurinol  150 mg Oral Daily  . atorvastatin  20 mg Oral Daily  . calcium acetate  667 mg Oral TID WC  . darbepoetin (ARANESP) injection - NON-DIALYSIS  200 mcg Subcutaneous Q Sat-1800  . docusate sodium  100 mg Oral BID  . feeding supplement (NEPRO CARB STEADY)  237 mL Oral q morning - 10a  . insulin aspart  0-5 Units Subcutaneous QHS  . insulin aspart  0-9 Units Subcutaneous TID WC  . metoCLOPramide (REGLAN) injection  10 mg Intravenous Once  . metoprolol succinate  25 mg Oral BID  . midodrine  5 mg Oral TID WC  . pantoprazole  40 mg Oral Daily  . polyethylene glycol  17 g Oral Daily  . predniSONE  40 mg Oral Q breakfast  . sodium chloride  2 spray Each Nare  BID  . sodium chloride flush  3 mL Intravenous Q12H  . Warfarin - Pharmacist Dosing Inpatient   Does not apply q1800   Madelon Lips, MD 07/27/2017, 7:58 AM

## 2017-07-27 NOTE — Procedures (Signed)
Patient seen and examined on Hemodialysis. QB 400, UF goal *4.5L.  Tolerating well today.    Treatment adjusted as needed.  Madelon Lips MD [facility]  pgr [phone] 8:01 AM

## 2017-07-27 NOTE — Discharge Instructions (Signed)

## 2017-07-27 NOTE — Hospital Discharge Follow-Up (Signed)
The patient's follow up appointment at [facility] with Dr Jarold Song has been changed to 08/04/17 @ 1400 and the AVS was updated.   Olga Coaster, RN CM notified that the appointment for 07/28/17 was to be re-scheduled.

## 2017-07-28 ENCOUNTER — Other Ambulatory Visit: Payer: Self-pay

## 2017-07-28 ENCOUNTER — Other Ambulatory Visit: Payer: Self-pay | Admitting: Family Medicine

## 2017-07-28 ENCOUNTER — Inpatient Hospital Stay: Payer: Medicare Other | Admitting: Family Medicine

## 2017-07-28 DIAGNOSIS — E119 Type 2 diabetes mellitus without complications: Secondary | ICD-10-CM

## 2017-07-28 DIAGNOSIS — I50810 Right heart failure, unspecified: Secondary | ICD-10-CM | POA: Diagnosis not present

## 2017-07-28 DIAGNOSIS — I0981 Rheumatic heart failure: Secondary | ICD-10-CM | POA: Diagnosis not present

## 2017-07-28 DIAGNOSIS — Z794 Long term (current) use of insulin: Principal | ICD-10-CM

## 2017-07-28 MED FILL — CALCIUM ACETATE 667 MG CAP: 667 | 30 days supply | Qty: 90 | Fill #0

## 2017-07-28 MED FILL — MIDODRINE HCL 5 MG TABLET: 5 | 30 days supply | Qty: 90 | Fill #0

## 2017-07-28 MED FILL — hydrOXYzine HCL 10 MG TABS: 10 | 7 days supply | Qty: 30 | Fill #0

## 2017-07-28 MED FILL — ATORVASTATIN 20 MG TABLET: 20 | 30 days supply | Qty: 30 | Fill #0

## 2017-07-28 MED FILL — WARFARIN NA 2.5 MG TAB: 2.5 | 30 days supply | Qty: 30 | Fill #0

## 2017-07-28 MED FILL — ALLOPURINOL 300 MG TABS: 300 | 30 days supply | Qty: 15 | Fill #0

## 2017-07-28 MED FILL — METOPROLOL SUCC ER 25 MG TA: 25 | 30 days supply | Qty: 60 | Fill #0

## 2017-07-28 NOTE — Patient Outreach (Signed)
[facility]) Care Management  07/28/17  Michael [name] February 27, 1961 [id number]   RNCM received referral from hospital liaison to follow up with patient for transition of care. Patient was admitted to [facility] on 06/25/2017 after his PCP office sent him there for further eval of his anasarca. He had reported increased swelling for the past 2 weeks prior to admission and it had been worsening. He was admitted with marked volume overload with poor diuresis and renal failure leading to initiation of CRRT and transition to IHD. He was newly diagnosed with ESRD. Patient with a prominent RV failure and long-standing history of MV disease prior to valve replacement (that may have lead to pulmonary vascular changes and pulmonary hypertension). He was placed on milrinone on admission for RV support but weaned once RRT was started. He is not a candidate for milrinone at home due to limited social support and diagnosis of ESRD. CVVHD was stopped on 8/21 and patient was transitioned to IHD as he did not tolerate weaning with poor urine production and rising creatinine. He had AV fistula placed on 8/27. He was discharged home on HD at [facility] for 4 days a week dialysis (MWFSa).   Patient has a past medical history of anemia, chronic kidney disease, systolic HF, DM, GERD, history of tricuspid valve repair, essential HTN, atrial fibrillation and pulmonary hypertension.  Successful outreach completed with patient with assistance of Geophysical data processor for [name]. Interpreter name was Marcie Mowers and his ID number was [phone].  Patient identification verified. RNCM provided education about [facility] Care Management Program and role of [facility] RNCM and patient was agreeable with outreach.   Patient stated that he had a fever and his doctor told him go to the hospital. When asked if he knew what his diagnosis is, he stated that his "body got swollen." When RNCM attempted to discuss HF with  patient, he stated he was not aware of what heart failure is. RNCM provided high level of education over the phone about what heart failure is in general and how this applies to him.  Patient stated that at discharge, he was "told to go to dialysis on Mondays, Wednesdays, Fridays and Saturdays for now." Patient was approved for [name] for transportation to his appointments, but stated that his friend picked him up from the hospital. He is having difficulty understanding all of his appointments and does not know who his cardiologist is. RNCM to take  a list of providers at home visit with phone numbers for him.  Patient reported that he does have a set of scales. RNCM provided education about weighing daily at the same time, first thing in the morning after going to the bathroom and before eating or drinking anything. Educated to watch for gain of 3 lbs in 24 hours and if he observes this, to call his doctor to discuss. Home health has not started, but he said someone called him and will be out to see him today at 1 pm. He does not know the agency name. He reported that he was able to get all of his medications filled. Patient currently denies any pain and shortness of breath. Also denies shortness of breath with ambulation/exertion and has no problems with shortness of breath when laying in bed. He stated that he just feels tired because he just came out of the hospital. Plan: RNCM to complete home visit with patient next week and will complete remainder of assessments. Patient will need a lot of hands on  education to ensure he understands his diagnosis and what to look for as he now also has a new diagnosis of ESRD. Patient speaks a little English and was answering some of RNCM questions prior to interpreter being able to ask in [name], however, it will be important to use interpreter to ensure patient is understanding the education/information provided to patient.  [place] R. Yarissa Reining, RN, BSN, [place] Management Coordinator [phone]

## 2017-07-29 DIAGNOSIS — D631 Anemia in chronic kidney disease: Secondary | ICD-10-CM | POA: Diagnosis not present

## 2017-07-29 DIAGNOSIS — N2581 Secondary hyperparathyroidism of renal origin: Secondary | ICD-10-CM | POA: Diagnosis not present

## 2017-07-29 DIAGNOSIS — N186 End stage renal disease: Secondary | ICD-10-CM | POA: Diagnosis not present

## 2017-07-29 DIAGNOSIS — I1310 Hypertensive heart and chronic kidney disease without heart failure, with stage 1 through stage 4 chronic kidney disease, or unspecified chronic kidney disease: Secondary | ICD-10-CM | POA: Diagnosis not present

## 2017-07-29 DIAGNOSIS — I509 Heart failure, unspecified: Secondary | ICD-10-CM | POA: Diagnosis not present

## 2017-07-29 DIAGNOSIS — Z992 Dependence on renal dialysis: Secondary | ICD-10-CM | POA: Diagnosis not present

## 2017-07-29 LAB — CULTURE, BLOOD (ROUTINE X 2)
CULTURE: NO GROWTH
Culture: NO GROWTH
SPECIAL REQUESTS: ADEQUATE
Special Requests: ADEQUATE

## 2017-07-30 ENCOUNTER — Other Ambulatory Visit: Payer: Self-pay | Admitting: *Deleted

## 2017-07-30 ENCOUNTER — Encounter: Payer: Self-pay | Admitting: *Deleted

## 2017-07-30 ENCOUNTER — Ambulatory Visit (INDEPENDENT_AMBULATORY_CARE_PROVIDER_SITE_OTHER): Payer: Medicare Other | Admitting: Internal Medicine

## 2017-07-30 DIAGNOSIS — Z5181 Encounter for therapeutic drug level monitoring: Secondary | ICD-10-CM

## 2017-07-30 DIAGNOSIS — I079 Rheumatic tricuspid valve disease, unspecified: Secondary | ICD-10-CM | POA: Diagnosis not present

## 2017-07-30 DIAGNOSIS — I059 Rheumatic mitral valve disease, unspecified: Secondary | ICD-10-CM | POA: Diagnosis not present

## 2017-07-30 DIAGNOSIS — Z992 Dependence on renal dialysis: Principal | ICD-10-CM

## 2017-07-30 DIAGNOSIS — N186 End stage renal disease: Secondary | ICD-10-CM

## 2017-07-30 DIAGNOSIS — I50810 Right heart failure, unspecified: Secondary | ICD-10-CM | POA: Diagnosis not present

## 2017-07-30 DIAGNOSIS — I0981 Rheumatic heart failure: Secondary | ICD-10-CM | POA: Diagnosis not present

## 2017-07-30 LAB — POCT INR: INR: 2.2

## 2017-07-30 NOTE — Patient Outreach (Addendum)
Received 2 [name] passes from [place], [place] with [place], to give to Mr. Brideau. As mentioned earlier, Mr. Schumm indicated to the [facility] RN that he could not afford 1.50 cost for [name] each way to HD. He has HD on Mondays, Wednesdays, Fridays, and Saturdays.  Writer went to Mr. Rodrigue house to drop off the 2 [name] passes that were provided on his behalf. Explained that he could use the passes each time he goes to HD. He expressed understanding. Advised Mr. Geathers to keep his [name] passes in a secure location. He expressed understanding and appreciation.   Telephone calls made to all parties involved to make aware that [name] passes were delivered to Mr. Alessio home. The concern was that patient would miss HD appointment due to not being able to afford the [name] fee.  Telephone calls made to  [place], [facility] RN, and Nurse Health Advisor with [place] to make aware of above.   Notification sent to [facility] LCSW to make aware that [name] passes were given to patient but follow up is needed.   Appreciative of the collaboration efforts of all parties involved.    [name], MSN-Ed, RN,BSN [facility] Liaison [phone]

## 2017-07-30 NOTE — Patient Outreach (Addendum)
Collaborated with [place] Advisor to request assist with [name] passes for Mr. Holloway. He goes to HD on [employer] on Mondays, Wednesday, Fridays, and Saturdays. Appreciative of assistance with this matter.   Telephone call made to Mr. Scheffler on the Interpreter line to make him aware that writer will drop off [name] passes to assist him for transportation to his HD. He is agreeable to this.  Spoke with [place] and [facility] to make aware of above.  Appreciative of all the collaboration efforts that have been made on Mr. Joslyn behalf.   [name], MSN-Ed, RN,BSN [facility] Liaison [phone]

## 2017-07-30 NOTE — Patient Outreach (Addendum)
Telephone call from [place] with [facility] who states Michael [name] reports he can not afford to pay the [name] fee for round trip to his HD appointments.  Ed[place]ina requested [place] [facility] assistance with the [name] transportation. He can benefit from [name] passes.  Telephone call to [place] to make aware of the above and request to contact [place] with [facility]. Of note, Michael [name] is in the High Risk Initiative program as well.   Will make referral to [place] as well.   [name], MSN-Ed, RN,BSN [facility] Liaison [phone]

## 2017-07-31 ENCOUNTER — Other Ambulatory Visit: Payer: Self-pay | Admitting: *Deleted

## 2017-07-31 ENCOUNTER — Encounter: Payer: Self-pay | Admitting: *Deleted

## 2017-07-31 DIAGNOSIS — N2581 Secondary hyperparathyroidism of renal origin: Secondary | ICD-10-CM | POA: Diagnosis not present

## 2017-07-31 DIAGNOSIS — D631 Anemia in chronic kidney disease: Secondary | ICD-10-CM | POA: Diagnosis not present

## 2017-07-31 DIAGNOSIS — I1310 Hypertensive heart and chronic kidney disease without heart failure, with stage 1 through stage 4 chronic kidney disease, or unspecified chronic kidney disease: Secondary | ICD-10-CM | POA: Diagnosis not present

## 2017-07-31 DIAGNOSIS — N186 End stage renal disease: Secondary | ICD-10-CM | POA: Diagnosis not present

## 2017-07-31 DIAGNOSIS — I509 Heart failure, unspecified: Secondary | ICD-10-CM | POA: Diagnosis not present

## 2017-07-31 DIAGNOSIS — Z992 Dependence on renal dialysis: Secondary | ICD-10-CM | POA: Diagnosis not present

## 2017-07-31 NOTE — Patient Outreach (Signed)
[facility]) Care Management  07/31/2017  Michael [name] May 16, 1961 [id number]   CSW was able to make initial contact with patient today to perform phone assessment, as well as assess and assist with social work needs and services.  CSW introduced self, explained role and types of services provided through [place] Management ([place] [facility]).  CSW further explained to patient that CSW works with [place] [name], [facility], also with [place] Management. CSW then explained the reason for the call, indicating that Ms. Michael [name] thought that patient would benefit from social work services and resources to assist with transportation to and from dialysis appointments, as well as physician appointments.  CSW obtained two HIPAA compliant identifiers from patient, which included patient's name and date of birth. CSW had a very lengthy conversation with patient pertaining specifically to transportation through [employer] [facility]) with [employer]).  CSW explained the process for making referrals, as well as how patient needs to go about scheduling transportation to and from dialysis appointments, as well as physician appointments.  Patient reported that he attends dialysis 4 days per week, Monday, Wednesday, Friday and Saturday, and that he is unable to afford the $1.50 one way, or $3.00 round trip for each appointment.  CSW is aware that patient has been given 2 [name] passes by Michael [name].  CSW also agreed to provide patient with [name] passes. Patient is aware that he will be given a total of 25 [name] passes through [place] Management.  CSW will provide patient with 23 [name] passes, three today and then an additional 20 next week.  The [name] passes have been ordered and should arrive mid-week.  CSW will make arrangements to deliver the passes directly to patient's home today.  This will enable patient to  get to and from his dialysis appointment on Monday, September 17th, as well as his follow-up Primary Care Physician appointment with [name] on Tuesday, September 18th.  The 5th pass will get patient to his dialysis appointment on Wednesday, September 18th.  CSW is hopeful that the additional [name] passes will arrive in time for CSW to meet patient at the dialysis center on Wednesday to provide him with the additional passes. With the additional 20 passes, patient will be transported home via [name] Wednesday evening, then will have enough passes to get to and from 9 additional dialysis treatments.  CSW spoke with patient at length about trying to offset some of his monthly expenses during this time so that he is able to afford $3.00, 4 days per week, for a total of $12.00, moving forward.  Patient voiced understanding and was agreeable to this plan.  No additional social work needs identified at this time. CSW will perform a case closure on patient, as all goals of treatment have been met from social work standpoint.  CSW will notify patient's RNCM with [place] Management, [place] [name] of CSW's plans to close patient's case.  CSW will fax an update to patient's Primary Care Physician, [name] to ensure that they are aware of CSW's involvement with patient's plan of care.  CSW will submit a case closure request to Michael [name], Care Management Assistant with [place] Management, in the form of an [employer].  CSW will ensure that Michael [name] is aware of Michael [name]'s, RNCM with [place] Management, continued involvement with patient's care. [name], BSW, MSW, [facility]  Licensed Clinical Social Worker  [place]  Mailing [place] [address] [place], [place] [address] Physical Address-[address], [place], [place] [address] Toll Free Main # [phone] Fax #  [phone] Cell # [phone]  Office # [phone] [name]

## 2017-08-01 DIAGNOSIS — I509 Heart failure, unspecified: Secondary | ICD-10-CM | POA: Diagnosis not present

## 2017-08-01 DIAGNOSIS — I1310 Hypertensive heart and chronic kidney disease without heart failure, with stage 1 through stage 4 chronic kidney disease, or unspecified chronic kidney disease: Secondary | ICD-10-CM | POA: Diagnosis not present

## 2017-08-01 DIAGNOSIS — N186 End stage renal disease: Secondary | ICD-10-CM | POA: Diagnosis not present

## 2017-08-01 DIAGNOSIS — D631 Anemia in chronic kidney disease: Secondary | ICD-10-CM | POA: Diagnosis not present

## 2017-08-01 DIAGNOSIS — N2581 Secondary hyperparathyroidism of renal origin: Secondary | ICD-10-CM | POA: Diagnosis not present

## 2017-08-01 DIAGNOSIS — Z992 Dependence on renal dialysis: Secondary | ICD-10-CM | POA: Diagnosis not present

## 2017-08-03 DIAGNOSIS — N186 End stage renal disease: Secondary | ICD-10-CM | POA: Diagnosis not present

## 2017-08-03 DIAGNOSIS — D631 Anemia in chronic kidney disease: Secondary | ICD-10-CM | POA: Diagnosis not present

## 2017-08-03 DIAGNOSIS — I1310 Hypertensive heart and chronic kidney disease without heart failure, with stage 1 through stage 4 chronic kidney disease, or unspecified chronic kidney disease: Secondary | ICD-10-CM | POA: Diagnosis not present

## 2017-08-03 DIAGNOSIS — N2581 Secondary hyperparathyroidism of renal origin: Secondary | ICD-10-CM | POA: Diagnosis not present

## 2017-08-03 DIAGNOSIS — I509 Heart failure, unspecified: Secondary | ICD-10-CM | POA: Diagnosis not present

## 2017-08-03 DIAGNOSIS — Z992 Dependence on renal dialysis: Secondary | ICD-10-CM | POA: Diagnosis not present

## 2017-08-04 ENCOUNTER — Ambulatory Visit: Payer: Medicare Other | Attending: Family Medicine | Admitting: Family Medicine

## 2017-08-04 ENCOUNTER — Encounter: Payer: Self-pay | Admitting: Family Medicine

## 2017-08-04 ENCOUNTER — Telehealth: Payer: Self-pay

## 2017-08-04 ENCOUNTER — Other Ambulatory Visit: Payer: Self-pay

## 2017-08-04 VITALS — BP 96/66 | HR 74 | Temp 98.1°F | Ht 63.5 in | Wt 166.8 lb

## 2017-08-04 DIAGNOSIS — I5022 Chronic systolic (congestive) heart failure: Secondary | ICD-10-CM

## 2017-08-04 DIAGNOSIS — I50810 Right heart failure, unspecified: Secondary | ICD-10-CM | POA: Diagnosis not present

## 2017-08-04 DIAGNOSIS — I2510 Atherosclerotic heart disease of native coronary artery without angina pectoris: Secondary | ICD-10-CM | POA: Insufficient documentation

## 2017-08-04 DIAGNOSIS — E669 Obesity, unspecified: Secondary | ICD-10-CM | POA: Diagnosis not present

## 2017-08-04 DIAGNOSIS — Z9981 Dependence on supplemental oxygen: Secondary | ICD-10-CM | POA: Diagnosis not present

## 2017-08-04 DIAGNOSIS — I480 Paroxysmal atrial fibrillation: Secondary | ICD-10-CM | POA: Insufficient documentation

## 2017-08-04 DIAGNOSIS — E1169 Type 2 diabetes mellitus with other specified complication: Secondary | ICD-10-CM | POA: Insufficient documentation

## 2017-08-04 DIAGNOSIS — I482 Chronic atrial fibrillation, unspecified: Secondary | ICD-10-CM

## 2017-08-04 DIAGNOSIS — M1A00X0 Idiopathic chronic gout, unspecified site, without tophus (tophi): Secondary | ICD-10-CM | POA: Insufficient documentation

## 2017-08-04 DIAGNOSIS — I132 Hypertensive heart and chronic kidney disease with heart failure and with stage 5 chronic kidney disease, or end stage renal disease: Secondary | ICD-10-CM | POA: Diagnosis not present

## 2017-08-04 DIAGNOSIS — G4733 Obstructive sleep apnea (adult) (pediatric): Secondary | ICD-10-CM | POA: Insufficient documentation

## 2017-08-04 DIAGNOSIS — I5042 Chronic combined systolic (congestive) and diastolic (congestive) heart failure: Secondary | ICD-10-CM | POA: Diagnosis not present

## 2017-08-04 DIAGNOSIS — I5082 Biventricular heart failure: Secondary | ICD-10-CM | POA: Diagnosis not present

## 2017-08-04 DIAGNOSIS — Z952 Presence of prosthetic heart valve: Secondary | ICD-10-CM | POA: Diagnosis not present

## 2017-08-04 DIAGNOSIS — Z9889 Other specified postprocedural states: Secondary | ICD-10-CM | POA: Diagnosis not present

## 2017-08-04 DIAGNOSIS — Z7901 Long term (current) use of anticoagulants: Secondary | ICD-10-CM | POA: Diagnosis not present

## 2017-08-04 DIAGNOSIS — E1122 Type 2 diabetes mellitus with diabetic chronic kidney disease: Secondary | ICD-10-CM | POA: Diagnosis not present

## 2017-08-04 DIAGNOSIS — I0981 Rheumatic heart failure: Secondary | ICD-10-CM | POA: Diagnosis not present

## 2017-08-04 DIAGNOSIS — J45909 Unspecified asthma, uncomplicated: Secondary | ICD-10-CM | POA: Insufficient documentation

## 2017-08-04 DIAGNOSIS — M545 Low back pain: Secondary | ICD-10-CM | POA: Diagnosis not present

## 2017-08-04 DIAGNOSIS — Z794 Long term (current) use of insulin: Secondary | ICD-10-CM | POA: Diagnosis not present

## 2017-08-04 DIAGNOSIS — N186 End stage renal disease: Secondary | ICD-10-CM | POA: Diagnosis not present

## 2017-08-04 DIAGNOSIS — Z992 Dependence on renal dialysis: Secondary | ICD-10-CM

## 2017-08-04 DIAGNOSIS — G8929 Other chronic pain: Secondary | ICD-10-CM | POA: Diagnosis not present

## 2017-08-04 DIAGNOSIS — Z6829 Body mass index (BMI) 29.0-29.9, adult: Secondary | ICD-10-CM | POA: Diagnosis not present

## 2017-08-04 DIAGNOSIS — E785 Hyperlipidemia, unspecified: Secondary | ICD-10-CM | POA: Insufficient documentation

## 2017-08-04 DIAGNOSIS — I2729 Other secondary pulmonary hypertension: Secondary | ICD-10-CM | POA: Diagnosis not present

## 2017-08-04 LAB — GLUCOSE, POCT (MANUAL RESULT ENTRY): POC Glucose: 84 mg/dl (ref 70–99)

## 2017-08-04 MED ORDER — TIZANIDINE HCL 4 MG PO TABS: 4.0000 mg | ORAL_TABLET | Freq: Three times a day (TID) | ORAL | 1 refills | Status: DC | PRN

## 2017-08-04 MED ORDER — ACCU-CHEK SOFTCLIX LANCETS MISC: 5 refills | Status: DC

## 2017-08-04 MED ORDER — ACCU-CHEK AVIVA PLUS W/DEVICE KIT: PACK | 0 refills | Status: DC

## 2017-08-04 MED ORDER — ACETAMINOPHEN-CODEINE #3 300-30 MG PO TABS: 1.0000 | ORAL_TABLET | Freq: Two times a day (BID) | ORAL | 1 refills | Status: DC | PRN

## 2017-08-04 MED ORDER — GLUCOSE BLOOD VI STRP: ORAL_STRIP | 12 refills | Status: DC

## 2017-08-04 MED FILL — ACETAMINOPHEN/COD #3 TABLET: 300-30 | 20 days supply | Qty: 40 | Fill #0

## 2017-08-04 MED FILL — tiZANidine HCL 4 MG TABS: 4 | 20 days supply | Qty: 60 | Fill #0

## 2017-08-04 NOTE — Patient Outreach (Signed)
[facility]) Care Management  [place]  08/04/2017   Michael [name] 08-09-1961 [id number]   Home visit completed with patient.  Subjective: Patient stated "I'm fine."   Objective: Blood pressure (!) 90/50, pulse 76, SpO2 97 %.  Encounter Medications:  Outpatient Encounter Prescriptions as of 08/04/2017  Medication Sig  . Insulin Detemir (LEVEMIR FLEXTOUCH) 100 UNIT/ML Pen Inject 10 Units into the skin daily at 10 pm.  . ACCU-CHEK AVIVA PLUS test strip USE 3 TIMES DAILY AS DIRECTED.  Marland Kitchen ACCU-CHEK SOFTCLIX LANCETS lancets USE AS INSTRUCTED 3 TIMES DAILY  . acetaminophen (TYLENOL) 650 MG CR tablet Take 650 mg by mouth every 6 (six) hours as needed for pain.  Marland Kitchen allopurinol (ZYLOPRIM) 300 MG tablet Take 0.5 tablets (150 mg total) by mouth daily.  Marland Kitchen atorvastatin (LIPITOR) 20 MG tablet Take 1 tablet (20 mg total) by mouth daily.  . Blood Glucose Monitoring Suppl (ACCU-CHEK AVIVA PLUS) w/Device KIT USE AS INSTRUCTED 3 TIMES DAILY  . calcium acetate (PHOSLO) 667 MG capsule Take 1 capsule (667 mg total) by mouth 3 (three) times daily with meals.  . colchicine 0.6 MG tablet Take 1.2 mg by mouth daily as needed for pain.  . Darbepoetin Alfa (ARANESP) 200 MCG/0.4ML SOSY injection Inject 0.4 mLs (200 mcg total) into the skin every Saturday at 6 PM.  . diclofenac sodium (VOLTAREN) 1 % GEL Apply 4 g topically 4 (four) times daily.  Marland Kitchen docusate sodium (COLACE) 100 MG capsule Take 1 capsule (100 mg total) by mouth 2 (two) times daily.  . folic acid (FOLVITE) 1 MG tablet TAKE 1 TABLET BY MOUTH DAILY.  . hydrOXYzine (ATARAX/VISTARIL) 10 MG tablet Take 1 tablet (10 mg total) by mouth every 6 (six) hours as needed for itching or anxiety (if itching unrelieved by benadryl).  . metoprolol succinate (TOPROL-XL) 25 MG 24 hr tablet Take 1 tablet (25 mg total) by mouth 2 (two) times daily.  . midodrine (PROAMATINE) 5 MG tablet Take 1 tablet (5 mg total) by mouth 3 (three) times daily  with meals.  Marland Kitchen omeprazole (PRILOSEC) 20 MG capsule Take 1 capsule (20 mg total) by mouth daily.  . OXYGEN Inhale 2 L into the lungs daily.  . traMADol (ULTRAM) 50 MG tablet Take 1-2 tablets (50-100 mg total) by mouth every 12 (twelve) hours as needed.  . warfarin (COUMADIN) 2.5 MG tablet Take 1 tablet (2.5 mg total) by mouth daily at 6 PM. Or as directed by coumadin clinic   No facility-administered encounter medications on file as of 08/04/2017.     Functional Status:  In your present state of health, do you have any difficulty performing the following activities: 07/31/2017 06/26/2017  Hearing? N N  Vision? N N  Difficulty concentrating or making decisions? N N  Walking or climbing stairs? Y Y  Dressing or bathing? N N  Comment - but needs minimal assist when pt has sob  Doing errands, shopping? [place] Donning  Preparing Food and eating ? N -  Using the Toilet? N -  In the past six months, have you accidently leaked urine? N -  Do you have problems with loss of bowel control? N -  Managing your Medications? Y -  Managing your Finances? Y -  Housekeeping or managing your Housekeeping? Y -  Some recent data might be hidden    Fall/Depression Screening: Fall Risk  07/31/2017 06/25/2017 03/03/2017  Falls in the past year? Yes No No  Number falls in past yr:  2 or more - -  Injury with Fall? Yes - -  Risk Factor Category  High Fall Risk - -  Risk for fall due to : History of fall(s);Impaired balance/gait;Impaired mobility - -  Follow up Education provided;Falls prevention discussed - -   PHQ 2/9 Scores 07/31/2017 06/25/2017 03/03/2017 12/12/2016 04/22/2016 03/04/2016 02/12/2016  PHQ - 2 Score 4 4 0 0 0 0 0  PHQ- 9 Score 7 15 - 2 - - -    Assessment:  Patient currently denies any chest pain or shortness of breath.  Patient is attending dialysis on Mondays, Wednesdays, Fridays and Saturdays. He is using [name] for transportation. [name], [facility] SW has obtained [name] passess and provided them to patient  so that he can get to his appointments. Dialysis center has been scheduling all of patient's rides. RNCM reviewed all of patient's medications. Patient currently has all of his medications and his home health nurse is filling his pillbox for him.  Patient needs a new glucometer. RNCM is working collaboratively with his home health nurse and PCP to get patient a glucometer. Patient does not have any significant support system. Patient has alcohol bottles around the home, but denies drinking alcohol. He stated they were from his roommate. When asking about his roommate, he indicated that it was his former roommate and that he had moved out. Patient does have a daughter locally, but she is not able to provide a lot of care due to being in college. Patient reported that he has a friend that helps to take him to appointments, run errands or get groceries when he he can.  Patient has no current needs or concerns at present.   Plan: Will continue to follow patient for transition of care.  [place] R. Evie Crumpler, RN, BSN, [place] Management Coordinator [phone]

## 2017-08-04 NOTE — Progress Notes (Signed)
Subjective:  Patient ID: Michael [name], male    DOB: [birth year]  Age: 56 y.o. MRN: [id number]  CC: Diabetes   HPI Michael [name] is a 24 with a history of Type 2DM (A1c 5.5), hypertension, hyperlipidemia, mitral valve replacement with porcine valve, tricuspid valve repair, atrial fibrillation (on anticoagulation with Coumadin),Chronic systolic heartfailure  (EF 50-55% from 05/2017 ), Cirrhosis, ESRD recently commenced on HD during his most recent hospitalization for acute on chronic diastolic CHF from 02/16/92 through 07/27/17.  He had presented with volume overload, not responding to diuresis, BNP was 875 on admission. Milrinone was commenced for right ventricular support. Right heart cath revealed more prominent right ventricular failure, pulmonary venous hypertension. With diuresis he developed acute on chronic renal failure requiring hemodialysis which was performed through a right IJ permacath; AV fistula placed by vascular and he was weaned off milrinone once he commenced renal replacement therapy.  He underwent TEE guided DCCV for his A. Fib but returned back into Afib. TEE revealed EF of 55-60%, no regional wall motion abnormalities, trivial MR, bioprosthetic mitral valve, D-shaped interventricular septum suggestive of RV pressure/volume overload with severely dilated RV, moderately decreased systolic function. Dilated right atrium. no cardiac source of emboli.  His Coumadin was continued. During his hospital course he had a rectal bleed and received packed red blood cells due to anemia with a hemoglobin of 6.6 (hemoglobin at discharge was 9.7) Colonoscopy revealed 2 polyps which were removed,  Pathology negative for high-grade dysplasia. EGD was negative for varices  He presents today with complaints of pain across his lower back which does not radiate down his lower extremities and is described as severe. Pain does not respond to tramadol. He states he has had the pain since  hospitalization and denies numbness in extremities or recent falls. He denies shortness of breath, chest pains. He will need prescription for a new glucometer. His last INR was 2.2, five days ago at the Coumadin clinic.  Past Medical History:  Diagnosis Date  . Anemia of chronic disease   . Asthma    said he does not know  . Chronic kidney disease (CKD), stage IV (severe) ([place])   . Chronic systolic CHF (congestive heart failure) (HCC)    a. prior EF normal; b. Echo 10/16: Mild LVH, EF 30-35%, mitral valve bioprosthesis present without evidence of stenosis or regurgitation, severe LAE, mild RVE with mild to moderately reduced RVSF, moderate RAE  . Coronary artery disease   . Diabetes mellitus type 2 in obese ([place])   . Diabetes mellitus without complication ([place])   . Dyspnea   . GERD (gastroesophageal reflux disease)   . H/O tricuspid valve repair 2012  . History of echocardiogram    Echo at [facility] 4/12: mod LVH, EF 60-65%, mod LAE, tissue MVR ok, mod TR, mod pulmo HTN with RVSP 48 mmHg  . Hypertension   . Obstructive sleep apnea   . Paroxysmal atrial fibrillation (HCC)    s/p Maze procedure at time of MVR in 2011  . Pulmonary HTN ([place])   . 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 AV FISTULA PLACEMENT Left 07/13/2017   Procedure: LEFT ARM ARTERIOVENOUS (AV) FISTULA CREATION;  Surgeon: Waynetta Sandy, MD;  Location: [place];  Service: Vascular;  Laterality: Left;  . CARDIAC SURGERY    . CARDIAC VALVE REPLACEMENT  2011  . CARDIOVERSION N/A 07/09/2017   Procedure: CARDIOVERSION;  Surgeon: Larey Dresser, MD;  [facility] ENDOSCOPY;  Service: Cardiovascular;  Laterality: N/A;  . COLONOSCOPY N/A 07/16/2017   Procedure: COLONOSCOPY;  Surgeon: Jerene Bears, MD;  Location: [place];  Service: Gastroenterology;  Laterality: N/A;  . ESOPHAGOGASTRODUODENOSCOPY N/A 07/16/2017   Procedure:  ESOPHAGOGASTRODUODENOSCOPY (EGD);  Surgeon: Jerene Bears, MD;  Location: [facility] ENDOSCOPY;  Service: Gastroenterology;  Laterality: N/A;  . INSERTION OF DIALYSIS CATHETER Right 07/13/2017   Procedure: INSERTION OF DIALYSIS CATHETER RIGHT INTERNAL JUGULAR;  Surgeon: Waynetta Sandy, MD;  Location: [place];  Service: Vascular;  Laterality: Right;  . RIGHT HEART CATH N/A 06/29/2017   Procedure: RIGHT HEART CATH;  Surgeon: Larey Dresser, MD;  Location: [place] CV LAB;  Service: Cardiovascular;  Laterality: N/A;  . TEE WITHOUT CARDIOVERSION N/A 07/09/2017   Procedure: TRANSESOPHAGEAL ECHOCARDIOGRAM (TEE);  Surgeon: Larey Dresser, MD;  Location: [facility] ENDOSCOPY;  Service: Cardiovascular;  Laterality: N/A;     Outpatient Medications Prior to Visit  Medication Sig Dispense Refill  . acetaminophen (TYLENOL) 650 MG CR tablet Take 650 mg by mouth every 6 (six) hours as needed for pain.    Marland Kitchen allopurinol (ZYLOPRIM) 300 MG tablet Take 0.5 tablets (150 mg total) by mouth daily. 15 tablet 3  . atorvastatin (LIPITOR) 20 MG tablet Take 1 tablet (20 mg total) by mouth daily. 30 tablet 3  . calcium acetate (PHOSLO) 667 MG capsule Take 1 capsule (667 mg total) by mouth 3 (three) times daily with meals. 90 capsule 3  . colchicine 0.6 MG tablet Take 1.2 mg by mouth daily as needed for pain.  0  . Darbepoetin Alfa (ARANESP) 200 MCG/0.4ML SOSY injection Inject 0.4 mLs (200 mcg total) into the skin every Saturday at 6 PM. 1.68 mL   . diclofenac sodium (VOLTAREN) 1 % GEL Apply 4 g topically 4 (four) times daily.    . folic acid (FOLVITE) 1 MG tablet TAKE 1 TABLET BY MOUTH DAILY. 30 tablet 2  . hydrOXYzine (ATARAX/VISTARIL) 10 MG tablet Take 1 tablet (10 mg total) by mouth every 6 (six) hours as needed for itching or anxiety (if itching unrelieved by benadryl). 30 tablet 0  . Insulin Detemir (LEVEMIR FLEXTOUCH) 100 UNIT/ML Pen Inject 10 Units into the skin daily at 10 pm. 15 mL 11  . metoprolol succinate  (TOPROL-XL) 25 MG 24 hr tablet Take 1 tablet (25 mg total) by mouth 2 (two) times daily. 60 tablet 6  . midodrine (PROAMATINE) 5 MG tablet Take 1 tablet (5 mg total) by mouth 3 (three) times daily with meals. 90 tablet 6  . OXYGEN Inhale 2 L into the lungs daily.    Marland Kitchen warfarin (COUMADIN) 2.5 MG tablet Take 1 tablet (2.5 mg total) by mouth daily at 6 PM. Or as directed by coumadin clinic 30 tablet 0  . ACCU-CHEK AVIVA PLUS test strip USE 3 TIMES DAILY AS DIRECTED. 100 each 12  . ACCU-CHEK SOFTCLIX LANCETS lancets USE AS INSTRUCTED 3 TIMES DAILY 100 each 5  . Blood Glucose Monitoring Suppl (ACCU-CHEK AVIVA PLUS) w/Device KIT USE AS INSTRUCTED 3 TIMES DAILY 1 kit 0  . traMADol (ULTRAM) 50 MG tablet Take 1-2 tablets (50-100 mg total) by mouth every 12 (twelve) hours as needed. 60 tablet 2  . docusate sodium (COLACE) 100 MG capsule Take 1 capsule (100 mg total) by mouth 2 (two) times daily. (Patient not taking: Reported on 08/04/2017) 10 capsule 0  . omeprazole (PRILOSEC) 20 MG capsule Take 1 capsule (20  mg total) by mouth daily. (Patient not taking: Reported on 08/04/2017) 30 capsule 5   No facility-administered medications prior to visit.     ROS Review of Systems  Constitutional: Negative for activity change and appetite change.  HENT: Negative for sinus pressure and sore throat.   Eyes: Negative for visual disturbance.  Respiratory: Negative for cough, chest tightness and shortness of breath.   Cardiovascular: Negative for chest pain and leg swelling.  Gastrointestinal: Negative for abdominal distention, abdominal pain, constipation and diarrhea.  Endocrine: Negative.   Genitourinary: Negative for dysuria.  Musculoskeletal: Positive for back pain. Negative for joint swelling and myalgias.  Skin: Negative for rash.  Allergic/Immunologic: Negative.   Neurological: Negative for weakness, light-headedness and numbness.  Psychiatric/Behavioral: Negative for dysphoric mood and suicidal ideas.     Objective:  BP 96/66   Pulse 74   Temp 98.1 F (36.7 C) (Oral)   Ht 5' 3.5" (1.613 m)   Wt 166 lb 12.8 oz (75.7 kg)   SpO2 98%   BMI 29.08 kg/m   BP/Weight 08/04/2017 08/04/2017 4/56/2563  Systolic BP 90 96 96  Diastolic BP 50 66 64  Wt. (Lbs) - 166.8 172.4  BMI - 29.08 -      Physical Exam  Constitutional: He is oriented to person, place, and time. He appears well-developed and well-nourished.  Cardiovascular: Normal rate, normal heart sounds and intact distal pulses.   No murmur heard. Pulmonary/Chest: Effort normal and breath sounds normal. He has no wheezes. He has no rales. He exhibits no tenderness.  Perm A cath on right upper chestwall  Abdominal: Soft. Bowel sounds are normal. He exhibits no distension and no mass. There is no tenderness.  Musculoskeletal: Normal range of motion.  Left AV fistula, not mature  Neurological: He is alert and oriented to person, place, and time.    Lab Results  Component Value Date   HGBA1C 5.5 05/30/2017     Assessment & Plan:   1. Type 2 diabetes mellitus with other specified complication, with long-term current use of insulin (HCC) Controlled with A1c of 5.5 Will need to be cautious to prevent hypoglycemia especially in the setting of CKD - POCT glucose (manual entry) - ACCU-CHEK SOFTCLIX LANCETS lancets; USE AS INSTRUCTED 3 TIMES DAILY  Dispense: 100 each; Refill: 5 - glucose blood (ACCU-CHEK AVIVA PLUS) test strip; USE 3 TIMES DAILY AS DIRECTED.  Dispense: 100 each; Refill: 12 - Blood Glucose Monitoring Suppl (ACCU-CHEK AVIVA PLUS) w/Device KIT; USE AS INSTRUCTED 3 TIMES DAILY  Dispense: 1 kit; Refill: 0  2. ESRD on dialysis [facility]) Continue current HD schedule   3. Chronic systolic CHF (congestive heart failure) ([place]) With right ventricular failure Ef 55-60% from TEE of 06/2017 Euvolemic Low sodium, heart healthy diet [place] appointment with Cardiology  4. Chronic atrial fibrillation (HCC) Currently on  Coumadin  5. Idiopathic chronic gout without tophus, unspecified site No recent flares  6. Chronic midline low back pain without sciatica Likely musculoskeletal Uncontrolled and tramadol We'll switch to Tylenol No. 3 - acetaminophen-codeine (TYLENOL #3) 300-30 MG tablet; Take 1 tablet by mouth every 12 (twelve) hours as needed for moderate pain.  Dispense: 40 tablet; Refill: 1 - tiZANidine (ZANAFLEX) 4 MG tablet; Take 1 tablet (4 mg total) by mouth every 8 (eight) hours as needed for muscle spasms.  Dispense: 60 tablet; Refill: 1   Meds ordered this encounter  Medications  . acetaminophen-codeine (TYLENOL #3) 300-30 MG tablet    Sig: Take 1 tablet by mouth  every 12 (twelve) hours as needed for moderate pain.    Dispense:  40 tablet    Refill:  1  . ACCU-CHEK SOFTCLIX LANCETS lancets    Sig: USE AS INSTRUCTED 3 TIMES DAILY    Dispense:  100 each    Refill:  5  . glucose blood (ACCU-CHEK AVIVA PLUS) test strip    Sig: USE 3 TIMES DAILY AS DIRECTED.    Dispense:  100 each    Refill:  12  . Blood Glucose Monitoring Suppl (ACCU-CHEK AVIVA PLUS) w/Device KIT    Sig: USE AS INSTRUCTED 3 TIMES DAILY    Dispense:  1 kit    Refill:  0  . tiZANidine (ZANAFLEX) 4 MG tablet    Sig: Take 1 tablet (4 mg total) by mouth every 8 (eight) hours as needed for muscle spasms.    Dispense:  60 tablet    Refill:  1    Follow-up: Return in about 1 month (around 09/03/2017) for Follow-up of back pain and chronic medical conditions.   [name] MD

## 2017-08-04 NOTE — Telephone Encounter (Signed)
Call received from [facility], RN/THN reporting that she is with the patient. He doesn't have a glucomete.  [place] is not able to fill the prescription for one because it needs to be filled under his medicare - part B. As per [place], the prescription can be sent to [facility]. Message sent to Dr Jarold Song regarding where to send the prescription for glucometer and testing supplies/lancets.   [place] also noted that they are providing [name] vouchers for him to get to HD appointments. The [facility] SW will also be seeing the patient to discuss long term plans.

## 2017-08-05 DIAGNOSIS — I509 Heart failure, unspecified: Secondary | ICD-10-CM | POA: Diagnosis not present

## 2017-08-05 DIAGNOSIS — Z992 Dependence on renal dialysis: Secondary | ICD-10-CM | POA: Diagnosis not present

## 2017-08-05 DIAGNOSIS — I1310 Hypertensive heart and chronic kidney disease without heart failure, with stage 1 through stage 4 chronic kidney disease, or unspecified chronic kidney disease: Secondary | ICD-10-CM | POA: Diagnosis not present

## 2017-08-05 DIAGNOSIS — D631 Anemia in chronic kidney disease: Secondary | ICD-10-CM | POA: Diagnosis not present

## 2017-08-05 DIAGNOSIS — N2581 Secondary hyperparathyroidism of renal origin: Secondary | ICD-10-CM | POA: Diagnosis not present

## 2017-08-05 DIAGNOSIS — N186 End stage renal disease: Secondary | ICD-10-CM | POA: Diagnosis not present

## 2017-08-06 ENCOUNTER — Other Ambulatory Visit: Payer: Self-pay

## 2017-08-06 DIAGNOSIS — I0981 Rheumatic heart failure: Secondary | ICD-10-CM | POA: Diagnosis not present

## 2017-08-06 DIAGNOSIS — I50810 Right heart failure, unspecified: Secondary | ICD-10-CM | POA: Diagnosis not present

## 2017-08-06 NOTE — Patient Outreach (Signed)
[facility]) Care Management  08/06/17  Michael [name] 06-12-1961 [id number]  Successful outreach completed with patient to follow up regarding receipt of his glucometer. Patient identification verified. Patient stated that he went to [facility] and they gave him his test strips but did not give him a glucometer. He stated that he still has no way to check his sugar. RNCM to follow up with pharmacy regarding this as there was a prescription sent by his PCP over there.   Patient stated that he also needs assistance with arranging transportation with [name] for his appointment on Tuesday 9/25 with Richardson Dopp. He stated that he has not scheduled his ride and does not know how to do it. He stated he has not had to schedule any of his rides, as someone has been doing it for him, but he is not sure. RNCM advised at this time, they are closed, but will call them tomorrow to arrange his ride so he can make it to his follow up appointment.  Patient has no other questions or concerns at present. He stated he has been doing ok.  Plan: RNCM to follow up with [place] and [name].  [place] R. Johnanthony Wilden, RN, BSN, [place] Management Coordinator [phone]

## 2017-08-07 ENCOUNTER — Other Ambulatory Visit: Payer: Self-pay

## 2017-08-07 ENCOUNTER — Telehealth: Payer: Self-pay

## 2017-08-07 DIAGNOSIS — D631 Anemia in chronic kidney disease: Secondary | ICD-10-CM | POA: Diagnosis not present

## 2017-08-07 DIAGNOSIS — I1310 Hypertensive heart and chronic kidney disease without heart failure, with stage 1 through stage 4 chronic kidney disease, or unspecified chronic kidney disease: Secondary | ICD-10-CM | POA: Diagnosis not present

## 2017-08-07 DIAGNOSIS — Z992 Dependence on renal dialysis: Secondary | ICD-10-CM | POA: Diagnosis not present

## 2017-08-07 DIAGNOSIS — N2581 Secondary hyperparathyroidism of renal origin: Secondary | ICD-10-CM | POA: Diagnosis not present

## 2017-08-07 DIAGNOSIS — I509 Heart failure, unspecified: Secondary | ICD-10-CM | POA: Diagnosis not present

## 2017-08-07 DIAGNOSIS — N186 End stage renal disease: Secondary | ICD-10-CM | POA: Diagnosis not present

## 2017-08-07 NOTE — Patient Outreach (Signed)
[facility]) Care Management  08/07/17  Macy Blanda 1961/07/31 [id number]  COLLABORATION WITH HOME HEALTH: [facility]  Successful outreach completed with Bethena Roys, RN with [facility]. Provided updates about patient's glucometer and transportation to appointments.  Per Bethena Roys, patient has a daughter, but she provides very limited support. She has never met daughter. She stated when she did a home visit, she met patient's friend, a young male who assists him with transportation to pick up groceries, etc. We discussed someone arranging his transportation as patient was unaware of how to do and she was under the impression that his daughter was doing this. Dialysis center has also been assisting, especially with his appointments there. We also discussed patient's limited understanding of the seriousness of his condition. Will need to ensure as we provide assistance and education that he is understanding the information we are providing. Although he does speak some English and often says he does not need an interpreter, he still has difficulty understanding the information provided and sometimes has difficulty communicating what he is experiencing.   No other issues or concerns noted. RNCM and [facility] to continue to collaborate as needed.  [place] R. Lina Hitch, RN, BSN, [place] Management Coordinator [phone]

## 2017-08-07 NOTE — Patient Outreach (Signed)
[facility]) Care Management  08/07/17  Aj Lagares Feb 16, 1961 [id number]  COLLABORATION WITH PCP OFFICE:  RNCM received voicemail from Carlis Abbott, RN with [facility] to follow up regarding glucometer.  RNCM returned call and spoke with Opal Sidles. Advised that Medicare would not cover the glucometer because he has had one filled in last 5 years, but [facility] and pharmacy is working on documentation that needs to be completed so that they will cover it. Will update her once we have more information. No other needs for follow up at present.  [place] R. Keelin Sheridan, RN, BSN, [place] Management Coordinator [phone]

## 2017-08-07 NOTE — Telephone Encounter (Signed)
Call placed to [facility], RN/THN to inquire if the patient has received his glucometer as the prescription was sent to [facility] as requested.  Voicemail message left with call back # [phone]

## 2017-08-07 NOTE — Patient Outreach (Addendum)
[facility]) Care Management  08/07/17  Ryer Garzon 22-Aug-1961  [id number]  Successful outreach completed with [place] at [facility]. [phone]. Spoke with Lonn Georgia who stated that they tried to run his glucometer through [employer], but since he has had the same meter filled in the last 5 years, they need documentation as to why he needs a new one. Lonn Georgia stated that is looking through their documentation, that his insurance provider is supposed to send a form to be completed, but cannot tell if it is coming to them via fax or being mailed to the patient. She stated she will call and have them fax it directly to the pharmacy and she will get it to the patient. RNCM requested she forward it to [facility], as RNCM will be completing home visit with patient next week and can ensure paperwork is completed with him and return it to pharmacy once completed. RNCM provided fax number and contact information to [facility] and she will follow up with insurance and RNCM about necessary paperwork.  ADDENDUM: RNCM received voicemail from [facility] stating that she had received the paperwork and was able to fill it out and submit it. She stated that she does not need to send to [facility] and she will follow up with the insurance company and resubmit the claim to see if it runs through.  [place] R. Shayann Garbutt, RN, BSN, [place] Management Coordinator [phone]

## 2017-08-07 NOTE — Patient Outreach (Signed)
[facility]) Care Management  08/07/17  Abas Townshend [birth year] [id number]  Successful outreach completed with [name] to arrange transportation for patient for his appointment on 08/11/2017 with Richardson Dopp, PA at [facility]. Spoke with Jonette Eva who verified that patient already has transportation arranged for his appointment and his pick up time at his home address is 7:30 am - 8:07 am taking him to [address] and them picking him up at MD appointment between 10:15 am and 10:45 am and returning him to his home address. RNCM to update patient with this information.  Successful outreach completed with patient. Patient identification verified. RNCM advised patient that his transportation was already arranged for Tuesday and that [name] will be there to pick him up between 7:30 am and 8:07 am. Patient thanked [facility] for verifying this.   RNCM also provided information about his glucometer. Advised that he has had one filled in last 5 years and Medicare will not cover without information explaining why he needs a new one. Advised RNCM is working with his pharmacy to ensure this is completed so we can pick up his meter. Will update him once there is more information.   Plan: RNCM to follow up with patient next week with home visit.  [place] R. Yarlin Breisch, RN, BSN, [place] Management Coordinator [phone]

## 2017-08-07 NOTE — Telephone Encounter (Signed)
Call received from [place], [place] Margie Billet noting that she has not been able to get the patient's glucometer yet because the pharmacy is waiting for documentation from the patient's insurance. [place] stated that she is following up regarding the needed documentation.

## 2017-08-08 DIAGNOSIS — I1310 Hypertensive heart and chronic kidney disease without heart failure, with stage 1 through stage 4 chronic kidney disease, or unspecified chronic kidney disease: Secondary | ICD-10-CM | POA: Diagnosis not present

## 2017-08-08 DIAGNOSIS — Z992 Dependence on renal dialysis: Secondary | ICD-10-CM | POA: Diagnosis not present

## 2017-08-08 DIAGNOSIS — D631 Anemia in chronic kidney disease: Secondary | ICD-10-CM | POA: Diagnosis not present

## 2017-08-08 DIAGNOSIS — N2581 Secondary hyperparathyroidism of renal origin: Secondary | ICD-10-CM | POA: Diagnosis not present

## 2017-08-08 DIAGNOSIS — I509 Heart failure, unspecified: Secondary | ICD-10-CM | POA: Diagnosis not present

## 2017-08-08 DIAGNOSIS — N186 End stage renal disease: Secondary | ICD-10-CM | POA: Diagnosis not present

## 2017-08-10 ENCOUNTER — Other Ambulatory Visit: Payer: Self-pay

## 2017-08-10 ENCOUNTER — Emergency Department (HOSPITAL_COMMUNITY): Payer: Medicare Other

## 2017-08-10 ENCOUNTER — Encounter (HOSPITAL_COMMUNITY): Payer: Self-pay | Admitting: Emergency Medicine

## 2017-08-10 ENCOUNTER — Inpatient Hospital Stay (HOSPITAL_COMMUNITY)
Admission: EM | Admit: 2017-08-10 | Discharge: 2017-08-26 | DRG: 286 | Disposition: A | Discharge status: Home / Self Care | Payer: Medicare Other | Attending: Internal Medicine | Admitting: Internal Medicine

## 2017-08-10 DIAGNOSIS — R072 Precordial pain: Secondary | ICD-10-CM | POA: Diagnosis not present

## 2017-08-10 DIAGNOSIS — M79632 Pain in left forearm: Secondary | ICD-10-CM | POA: Diagnosis not present

## 2017-08-10 DIAGNOSIS — I469 Cardiac arrest, cause unspecified: Secondary | ICD-10-CM | POA: Diagnosis not present

## 2017-08-10 DIAGNOSIS — I132 Hypertensive heart and chronic kidney disease with heart failure and with stage 5 chronic kidney disease, or end stage renal disease: Secondary | ICD-10-CM | POA: Diagnosis present

## 2017-08-10 DIAGNOSIS — Z9981 Dependence on supplemental oxygen: Secondary | ICD-10-CM

## 2017-08-10 DIAGNOSIS — I4891 Unspecified atrial fibrillation: Secondary | ICD-10-CM | POA: Diagnosis not present

## 2017-08-10 DIAGNOSIS — I480 Paroxysmal atrial fibrillation: Secondary | ICD-10-CM | POA: Diagnosis not present

## 2017-08-10 DIAGNOSIS — I420 Dilated cardiomyopathy: Secondary | ICD-10-CM | POA: Diagnosis present

## 2017-08-10 DIAGNOSIS — Z6827 Body mass index (BMI) 27.0-27.9, adult: Secondary | ICD-10-CM | POA: Diagnosis not present

## 2017-08-10 DIAGNOSIS — Z7189 Other specified counseling: Secondary | ICD-10-CM

## 2017-08-10 DIAGNOSIS — D631 Anemia in chronic kidney disease: Secondary | ICD-10-CM | POA: Diagnosis not present

## 2017-08-10 DIAGNOSIS — N186 End stage renal disease: Secondary | ICD-10-CM | POA: Diagnosis present

## 2017-08-10 DIAGNOSIS — R609 Edema, unspecified: Secondary | ICD-10-CM | POA: Diagnosis not present

## 2017-08-10 DIAGNOSIS — R0789 Other chest pain: Secondary | ICD-10-CM | POA: Diagnosis not present

## 2017-08-10 DIAGNOSIS — I5033 Acute on chronic diastolic (congestive) heart failure: Secondary | ICD-10-CM | POA: Diagnosis not present

## 2017-08-10 DIAGNOSIS — I130 Hypertensive heart and chronic kidney disease with heart failure and stage 1 through stage 4 chronic kidney disease, or unspecified chronic kidney disease: Secondary | ICD-10-CM | POA: Diagnosis not present

## 2017-08-10 DIAGNOSIS — E669 Obesity, unspecified: Secondary | ICD-10-CM | POA: Diagnosis present

## 2017-08-10 DIAGNOSIS — R079 Chest pain, unspecified: Secondary | ICD-10-CM | POA: Diagnosis present

## 2017-08-10 DIAGNOSIS — I2510 Atherosclerotic heart disease of native coronary artery without angina pectoris: Secondary | ICD-10-CM | POA: Diagnosis not present

## 2017-08-10 DIAGNOSIS — I272 Pulmonary hypertension, unspecified: Secondary | ICD-10-CM | POA: Diagnosis not present

## 2017-08-10 DIAGNOSIS — S7011XA Contusion of right thigh, initial encounter: Secondary | ICD-10-CM | POA: Diagnosis not present

## 2017-08-10 DIAGNOSIS — N189 Chronic kidney disease, unspecified: Secondary | ICD-10-CM

## 2017-08-10 DIAGNOSIS — E1169 Type 2 diabetes mellitus with other specified complication: Secondary | ICD-10-CM

## 2017-08-10 DIAGNOSIS — E785 Hyperlipidemia, unspecified: Secondary | ICD-10-CM | POA: Diagnosis present

## 2017-08-10 DIAGNOSIS — I4510 Unspecified right bundle-branch block: Secondary | ICD-10-CM | POA: Diagnosis present

## 2017-08-10 DIAGNOSIS — G4733 Obstructive sleep apnea (adult) (pediatric): Secondary | ICD-10-CM | POA: Diagnosis present

## 2017-08-10 DIAGNOSIS — I079 Rheumatic tricuspid valve disease, unspecified: Secondary | ICD-10-CM | POA: Diagnosis not present

## 2017-08-10 DIAGNOSIS — I482 Chronic atrial fibrillation: Secondary | ICD-10-CM | POA: Diagnosis not present

## 2017-08-10 DIAGNOSIS — I998 Other disorder of circulatory system: Secondary | ICD-10-CM | POA: Diagnosis not present

## 2017-08-10 DIAGNOSIS — I1310 Hypertensive heart and chronic kidney disease without heart failure, with stage 1 through stage 4 chronic kidney disease, or unspecified chronic kidney disease: Secondary | ICD-10-CM | POA: Diagnosis not present

## 2017-08-10 DIAGNOSIS — M109 Gout, unspecified: Secondary | ICD-10-CM | POA: Diagnosis present

## 2017-08-10 DIAGNOSIS — Z79899 Other long term (current) drug therapy: Secondary | ICD-10-CM

## 2017-08-10 DIAGNOSIS — E1129 Type 2 diabetes mellitus with other diabetic kidney complication: Secondary | ICD-10-CM

## 2017-08-10 DIAGNOSIS — I2582 Chronic total occlusion of coronary artery: Secondary | ICD-10-CM | POA: Diagnosis not present

## 2017-08-10 DIAGNOSIS — I5042 Chronic combined systolic (congestive) and diastolic (congestive) heart failure: Secondary | ICD-10-CM | POA: Diagnosis present

## 2017-08-10 DIAGNOSIS — I509 Heart failure, unspecified: Secondary | ICD-10-CM | POA: Diagnosis not present

## 2017-08-10 DIAGNOSIS — L03114 Cellulitis of left upper limb: Secondary | ICD-10-CM | POA: Diagnosis not present

## 2017-08-10 DIAGNOSIS — I9589 Other hypotension: Secondary | ICD-10-CM | POA: Diagnosis not present

## 2017-08-10 DIAGNOSIS — N2581 Secondary hyperparathyroidism of renal origin: Secondary | ICD-10-CM | POA: Diagnosis present

## 2017-08-10 DIAGNOSIS — R918 Other nonspecific abnormal finding of lung field: Secondary | ICD-10-CM | POA: Diagnosis not present

## 2017-08-10 DIAGNOSIS — K219 Gastro-esophageal reflux disease without esophagitis: Secondary | ICD-10-CM | POA: Diagnosis not present

## 2017-08-10 DIAGNOSIS — I959 Hypotension, unspecified: Secondary | ICD-10-CM | POA: Diagnosis present

## 2017-08-10 DIAGNOSIS — I2729 Other secondary pulmonary hypertension: Secondary | ICD-10-CM | POA: Diagnosis present

## 2017-08-10 DIAGNOSIS — R748 Abnormal levels of other serum enzymes: Secondary | ICD-10-CM | POA: Diagnosis not present

## 2017-08-10 DIAGNOSIS — R9439 Abnormal result of other cardiovascular function study: Secondary | ICD-10-CM | POA: Diagnosis not present

## 2017-08-10 DIAGNOSIS — M79644 Pain in right finger(s): Secondary | ICD-10-CM | POA: Diagnosis not present

## 2017-08-10 DIAGNOSIS — Z794 Long term (current) use of insulin: Secondary | ICD-10-CM

## 2017-08-10 DIAGNOSIS — E872 Acidosis, unspecified: Secondary | ICD-10-CM

## 2017-08-10 DIAGNOSIS — S0990XA Unspecified injury of head, initial encounter: Secondary | ICD-10-CM | POA: Diagnosis not present

## 2017-08-10 DIAGNOSIS — I4821 Permanent atrial fibrillation: Secondary | ICD-10-CM | POA: Diagnosis present

## 2017-08-10 DIAGNOSIS — E1122 Type 2 diabetes mellitus with diabetic chronic kidney disease: Secondary | ICD-10-CM | POA: Diagnosis not present

## 2017-08-10 DIAGNOSIS — I5082 Biventricular heart failure: Secondary | ICD-10-CM | POA: Diagnosis present

## 2017-08-10 DIAGNOSIS — Z87891 Personal history of nicotine dependence: Secondary | ICD-10-CM

## 2017-08-10 DIAGNOSIS — Z8673 Personal history of transient ischemic attack (TIA), and cerebral infarction without residual deficits: Secondary | ICD-10-CM

## 2017-08-10 DIAGNOSIS — Z952 Presence of prosthetic heart valve: Secondary | ICD-10-CM | POA: Diagnosis not present

## 2017-08-10 DIAGNOSIS — I951 Orthostatic hypotension: Secondary | ICD-10-CM | POA: Diagnosis not present

## 2017-08-10 DIAGNOSIS — I50812 Chronic right heart failure: Secondary | ICD-10-CM | POA: Insufficient documentation

## 2017-08-10 DIAGNOSIS — G8911 Acute pain due to trauma: Secondary | ICD-10-CM | POA: Diagnosis not present

## 2017-08-10 DIAGNOSIS — I10 Essential (primary) hypertension: Secondary | ICD-10-CM | POA: Diagnosis not present

## 2017-08-10 DIAGNOSIS — Z992 Dependence on renal dialysis: Secondary | ICD-10-CM

## 2017-08-10 DIAGNOSIS — I5032 Chronic diastolic (congestive) heart failure: Secondary | ICD-10-CM | POA: Diagnosis not present

## 2017-08-10 DIAGNOSIS — E8809 Other disorders of plasma-protein metabolism, not elsewhere classified: Secondary | ICD-10-CM | POA: Diagnosis present

## 2017-08-10 DIAGNOSIS — R21 Rash and other nonspecific skin eruption: Secondary | ICD-10-CM | POA: Diagnosis present

## 2017-08-10 DIAGNOSIS — I462 Cardiac arrest due to underlying cardiac condition: Secondary | ICD-10-CM | POA: Diagnosis not present

## 2017-08-10 DIAGNOSIS — I071 Rheumatic tricuspid insufficiency: Secondary | ICD-10-CM | POA: Diagnosis not present

## 2017-08-10 DIAGNOSIS — M79642 Pain in left hand: Secondary | ICD-10-CM | POA: Diagnosis not present

## 2017-08-10 DIAGNOSIS — R55 Syncope and collapse: Secondary | ICD-10-CM | POA: Diagnosis not present

## 2017-08-10 DIAGNOSIS — Z7901 Long term (current) use of anticoagulants: Secondary | ICD-10-CM | POA: Diagnosis not present

## 2017-08-10 DIAGNOSIS — M7989 Other specified soft tissue disorders: Secondary | ICD-10-CM | POA: Diagnosis not present

## 2017-08-10 DIAGNOSIS — R509 Fever, unspecified: Secondary | ICD-10-CM

## 2017-08-10 DIAGNOSIS — I129 Hypertensive chronic kidney disease with stage 1 through stage 4 chronic kidney disease, or unspecified chronic kidney disease: Secondary | ICD-10-CM | POA: Diagnosis not present

## 2017-08-10 HISTORY — DX: Syncope and collapse: R55

## 2017-08-10 LAB — I-STAT CHEM 8, ED
BUN: 18 mg/dL (ref 6–20)
CALCIUM ION: 1 mmol/L — AB (ref 1.15–1.40)
CHLORIDE: 94 mmol/L — AB (ref 101–111)
CREATININE: 3.4 mg/dL — AB (ref 0.61–1.24)
GLUCOSE: 89 mg/dL (ref 65–99)
HEMATOCRIT: 34 % — AB (ref 39.0–52.0)
Hemoglobin: 11.6 g/dL — ABNORMAL LOW (ref 13.0–17.0)
POTASSIUM: 3.9 mmol/L (ref 3.5–5.1)
Sodium: 135 mmol/L (ref 135–145)
TCO2: 29 mmol/L (ref 22–32)

## 2017-08-10 LAB — I-STAT TROPONIN, ED: TROPONIN I, POC: 0.03 ng/mL (ref 0.00–0.08)

## 2017-08-10 LAB — CBC
HCT: 32.4 % — ABNORMAL LOW (ref 39.0–52.0)
HEMOGLOBIN: 10.2 g/dL — AB (ref 13.0–17.0)
MCH: 26.9 pg (ref 26.0–34.0)
MCHC: 31.5 g/dL (ref 30.0–36.0)
MCV: 85.5 fL (ref 78.0–100.0)
PLATELETS: 80 10*3/uL — AB (ref 150–400)
RBC: 3.79 MIL/uL — ABNORMAL LOW (ref 4.22–5.81)
RDW: 19.1 % — AB (ref 11.5–15.5)
WBC: 7.6 10*3/uL (ref 4.0–10.5)

## 2017-08-10 LAB — I-STAT CG4 LACTIC ACID, ED
Lactic Acid, Venous: 2.64 mmol/L (ref 0.5–1.9)
Lactic Acid, Venous: 1.2 mmol/L (ref 0.5–1.9)

## 2017-08-10 LAB — PROTIME-INR
INR: 1.79
Prothrombin Time: 20.7 seconds — ABNORMAL HIGH (ref 11.4–15.2)

## 2017-08-10 LAB — COMPREHENSIVE METABOLIC PANEL
ALT: 13 U/L — AB (ref 17–63)
AST: 30 U/L (ref 15–41)
Albumin: 3.2 g/dL — ABNORMAL LOW (ref 3.5–5.0)
Alkaline Phosphatase: 142 U/L — ABNORMAL HIGH (ref 38–126)
Anion gap: 10 (ref 5–15)
BUN: 15 mg/dL (ref 6–20)
CHLORIDE: 97 mmol/L — AB (ref 101–111)
CO2: 26 mmol/L (ref 22–32)
CREATININE: 3.53 mg/dL — AB (ref 0.61–1.24)
Calcium: 8.7 mg/dL — ABNORMAL LOW (ref 8.9–10.3)
GFR calc Af Amer: 21 mL/min — ABNORMAL LOW (ref >60)
GFR calc non Af Amer: 18 mL/min — ABNORMAL LOW (ref >60)
GLUCOSE: 93 mg/dL (ref 65–99)
Potassium: 3.9 mmol/L (ref 3.5–5.1)
SODIUM: 133 mmol/L — AB (ref 135–145)
Total Bilirubin: 2.8 mg/dL — ABNORMAL HIGH (ref 0.3–1.2)
Total Protein: 6.8 g/dL (ref 6.5–8.1)

## 2017-08-10 LAB — GLUCOSE, CAPILLARY
Glucose-Capillary: 65 mg/dL (ref 65–99)
Glucose-Capillary: 109 mg/dL — ABNORMAL HIGH (ref 65–99)

## 2017-08-10 LAB — MRSA PCR SCREENING: MRSA by PCR: NEGATIVE

## 2017-08-10 LAB — TROPONIN I: Troponin I: 0.05 ng/mL (ref <0.03)

## 2017-08-10 MED ORDER — VANCOMYCIN HCL IN DEXTROSE 1-5 GM/200ML-% IV SOLN: 1000.0000 mg | Freq: Once | INTRAVENOUS | Status: DC

## 2017-08-10 MED ORDER — HYDROCODONE-ACETAMINOPHEN 5-325 MG PO TABS
1.0000 | ORAL_TABLET | ORAL | Status: DC | PRN
Start: 2017-08-10 — End: 2017-08-26
  Administered 2017-08-10: 2 via ORAL
  Administered 2017-08-12 (×2): 1 via ORAL
  Administered 2017-08-13: 2 via ORAL
  Administered 2017-08-13: 1 via ORAL
  Administered 2017-08-15 – 2017-08-18 (×4): 2 via ORAL
  Administered 2017-08-18 – 2017-08-19 (×2): 1 via ORAL
  Administered 2017-08-20 (×3): 2 via ORAL
  Administered 2017-08-21 – 2017-08-22 (×3): 1 via ORAL
  Administered 2017-08-23 – 2017-08-25 (×4): 2 via ORAL
  Administered 2017-08-26: 1 via ORAL
  Filled 2017-08-10 (×3): qty 1
  Filled 2017-08-10: qty 2
  Filled 2017-08-10: qty 1
  Filled 2017-08-10: qty 2
  Filled 2017-08-10: qty 1
  Filled 2017-08-10 (×2): qty 2
  Filled 2017-08-10 (×2): qty 1
  Filled 2017-08-10 (×2): qty 2
  Filled 2017-08-10: qty 1
  Filled 2017-08-10 (×5): qty 2
  Filled 2017-08-10: qty 1
  Filled 2017-08-10 (×2): qty 2

## 2017-08-10 MED ORDER — MIDODRINE HCL 5 MG PO TABS
5.0000 mg | ORAL_TABLET | Freq: Three times a day (TID) | ORAL | Status: DC
  Administered 2017-08-11: 5 mg via ORAL
  Filled 2017-08-10: qty 1

## 2017-08-10 MED ORDER — SODIUM CHLORIDE 0.9 % IV BOLUS (SEPSIS)
500.0000 mL | Freq: Once | INTRAVENOUS | Status: AC
  Administered 2017-08-10: 500 mL via INTRAVENOUS

## 2017-08-10 MED ORDER — INSULIN ASPART 100 UNIT/ML ~~LOC~~ SOLN: 0.0000 [IU] | Freq: Every day | SUBCUTANEOUS | Status: DC

## 2017-08-10 MED ORDER — SODIUM CHLORIDE 0.9 % IV BOLUS (SEPSIS)
1000.0000 mL | Freq: Once | INTRAVENOUS | Status: AC
  Administered 2017-08-10: 1000 mL via INTRAVENOUS

## 2017-08-10 MED ORDER — DEXTROSE 50 % IV SOLN
INTRAVENOUS | Status: AC
  Administered 2017-08-10: 25 mL
  Filled 2017-08-10: qty 50

## 2017-08-10 MED ORDER — WARFARIN SODIUM 5 MG PO TABS
5.0000 mg | ORAL_TABLET | Freq: Once | ORAL | Status: AC
  Administered 2017-08-10: 5 mg via ORAL
  Filled 2017-08-10 (×2): qty 1

## 2017-08-10 MED ORDER — DEXTROSE 5 % IV SOLN
2.0000 g | Freq: Once | INTRAVENOUS | Status: AC
  Administered 2017-08-10: 2 g via INTRAVENOUS
  Filled 2017-08-10: qty 2

## 2017-08-10 MED ORDER — GLUCAGON HCL RDNA (DIAGNOSTIC) 1 MG IJ SOLR
INTRAMUSCULAR | Status: AC
  Filled 2017-08-10: qty 1

## 2017-08-10 MED ORDER — SODIUM CHLORIDE 0.9 % IV SOLN
1500.0000 mg | Freq: Once | INTRAVENOUS | Status: AC
  Administered 2017-08-10: 1500 mg via INTRAVENOUS
  Filled 2017-08-10: qty 1500

## 2017-08-10 MED ORDER — CALCIUM ACETATE (PHOS BINDER) 667 MG PO CAPS
667.0000 mg | ORAL_CAPSULE | Freq: Three times a day (TID) | ORAL | Status: DC
  Administered 2017-08-11 – 2017-08-26 (×32): 667 mg via ORAL
  Filled 2017-08-10 (×34): qty 1

## 2017-08-10 MED ORDER — ATORVASTATIN CALCIUM 20 MG PO TABS
20.0000 mg | ORAL_TABLET | Freq: Every day | ORAL | Status: DC
  Administered 2017-08-10 – 2017-08-26 (×17): 20 mg via ORAL
  Filled 2017-08-10 (×17): qty 1

## 2017-08-10 MED ORDER — DEXTROSE 5 % IV SOLN
1.0000 g | INTRAVENOUS | Status: DC
  Administered 2017-08-11: 1 g via INTRAVENOUS
  Filled 2017-08-10 (×2): qty 1

## 2017-08-10 MED ORDER — ALLOPURINOL 100 MG PO TABS
150.0000 mg | ORAL_TABLET | Freq: Every day | ORAL | Status: DC
  Administered 2017-08-10 – 2017-08-26 (×16): 150 mg via ORAL
  Filled 2017-08-10 (×6): qty 2
  Filled 2017-08-10: qty 1
  Filled 2017-08-10 (×4): qty 2
  Filled 2017-08-10: qty 1
  Filled 2017-08-10 (×2): qty 2
  Filled 2017-08-10: qty 1
  Filled 2017-08-10: qty 2
  Filled 2017-08-10 (×2): qty 1

## 2017-08-10 MED ORDER — PANTOPRAZOLE SODIUM 40 MG PO TBEC
40.0000 mg | DELAYED_RELEASE_TABLET | Freq: Every day | ORAL | Status: DC
  Administered 2017-08-10 – 2017-08-26 (×16): 40 mg via ORAL
  Filled 2017-08-10 (×16): qty 1

## 2017-08-10 MED ORDER — WARFARIN - PHARMACIST DOSING INPATIENT: Freq: Every day | Status: DC

## 2017-08-10 MED ORDER — METOPROLOL SUCCINATE ER 25 MG PO TB24
25.0000 mg | ORAL_TABLET | Freq: Two times a day (BID) | ORAL | Status: DC
  Administered 2017-08-13 – 2017-08-25 (×15): 25 mg via ORAL
  Filled 2017-08-10 (×23): qty 1

## 2017-08-10 MED ORDER — DICLOFENAC SODIUM 1 % TD GEL
4.0000 g | Freq: Four times a day (QID) | TRANSDERMAL | Status: DC
  Administered 2017-08-10 – 2017-08-26 (×28): 4 g via TOPICAL
  Filled 2017-08-10: qty 100

## 2017-08-10 MED ORDER — INSULIN ASPART 100 UNIT/ML ~~LOC~~ SOLN
0.0000 [IU] | Freq: Three times a day (TID) | SUBCUTANEOUS | Status: DC
  Administered 2017-08-13: 2 [IU] via SUBCUTANEOUS
  Administered 2017-08-13 – 2017-08-16 (×3): 1 [IU] via SUBCUTANEOUS

## 2017-08-10 MED ORDER — SODIUM CHLORIDE 0.9 % IV SOLN
125.0000 mg | INTRAVENOUS | Status: AC
  Administered 2017-08-17 – 2017-08-26 (×5): 125 mg via INTRAVENOUS
  Filled 2017-08-10 (×12): qty 10

## 2017-08-10 NOTE — Progress Notes (Deleted)
Cardiology Office Note:    Date:  08/10/2017   ID:  Michael [name], DOB 20-May-1961, MRN [id number]  PCP:  [name], MD  Cardiologist:  Dr. [name]    Referring MD: [name], MD   No chief complaint on file. ***  History of Present Illness:    Michael [name] is a 56 y.o. 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 Echo done in [place] in 4/12 with normal LVF (EF 60-65%).     Admitted in 10/16 with AF with RVR and a/c systolic CHF. EF was 30-35%.  He was seen back in the office in follow-up but then readmitted 46/56 with a/c systolic CHF. ACE inhibitor was stopped secondary to worsening creatinine and he was placed on hydralazine, nitrates.   Admitted in 05/2017 with decompensated congestive heart failure.  Echo during that admission demonstrated improved LVF with EF 50-55.  Admitted 8/9-9/10 with marked volume overload (diastolic and RV failure), poor diuresis and AKI on CKD leading to initiation of CRRT.  CRRT was transitioned to hemodialysis (ESRD).  He was placed on Milrinone for RV support.  He underwent TEE-DCCV but had return of AFib.  Recommendation was to not try DCCV again. Patient was noted to be anemic with rectal bleeding.  He was transfused with PRBCs and underwent EGD/colo with polypectomy.  OP sleep study was planned for OSA.  He was followed by the HF Team.  He is not a candidate for advanced therapies and long term follow up with the HF Clinic was not recommended.  Of note, he did go to the ED 08/10/17 with unresponsiveness during dialysis.  ***  Mr. Vandrunen ***  Prior CV  studies:   The following studies were reviewed today:  TEE 07/09/17 EF 55-60, no RWMA, mod AI, Gr 3 plaque descending aorta, bioprosthetic MVR ok, severe LAE, LAA appears to have been ligated, D shaped IV septum suggestive of RV pressure/volume overload, mod decreased RVSF, mod RAE, peak RV-RA gradient 25  R Cardiac Catheterization 06/29/17 Mean RA 27, mean PA 40, mean PCWP 28 1. Elevated right and left heart filling pressures, more prominent RV failure.  2. Pulmonary venous hypertension.  3. Cardiac output vigorous on milrinone.   Echo 06/01/17 Mild conc LVH, EF 50-55, no RWMA, mild AI, MVR functioning normally, severe LAE, mod reduced RVSF, mod RAE, severe TR, PASP 54  Echo 04/08/17 Severe LVH, EF 35-40, mild-mod AI, MVR ok, severe LAE, mild reduced RVSF, mod RAE, mod-severe TR, PASP 55  Echo 02/08/16 Mod LVH, EF 40-45, mod AI, MVR ok, severe LAE, mild to mod reduced RVSF, mod to severe RAE, severe TR  Echo 09/11/15 Mild LVH, EF 30-35%, mitral valve bioprosthesis present without evidence of stenosis or regurgitation, severe LAE, mild RVE with mild to moderately reduced RVSF, moderate RAE   L Venous Duplex 09/11/15 No DVT   Past Medical History:  Diagnosis Date  . Anemia of chronic disease   . Asthma    said he does not know  . Chronic kidney disease (CKD), stage IV (severe) ([place])   . Chronic systolic CHF (congestive heart failure) (HCC)    a. prior EF normal;  b. Echo 10/16: Mild LVH, EF 30-35%, mitral valve bioprosthesis present without evidence of stenosis or regurgitation, severe LAE, mild RVE with mild to moderately reduced RVSF, moderate RAE  . Coronary artery disease   . Diabetes mellitus type 2 in obese ([place])   . Diabetes mellitus without complication ([place])   . Dyspnea   . GERD (gastroesophageal reflux disease)   . H/O tricuspid valve repair 2012  . History of echocardiogram    Echo at [facility] in [place] 4/12: mod LVH, EF 60-65%, mod LAE, tissue MVR ok, mod TR, mod pulmo  HTN with RVSP 48 mmHg  . Hypertension   . Obstructive sleep apnea   . Paroxysmal atrial fibrillation (HCC)    s/p Maze procedure at time of MVR in 2011  . Pulmonary HTN ([place])   . 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 AV FISTULA PLACEMENT Left 07/13/2017   Procedure: LEFT ARM ARTERIOVENOUS (AV) FISTULA CREATION;  Surgeon: Waynetta Sandy, MD;  Location: [facility];  Service: Vascular;  Laterality: Left;  . CARDIAC SURGERY    . CARDIAC VALVE REPLACEMENT  2011  . CARDIOVERSION N/A 07/09/2017   Procedure: CARDIOVERSION;  Surgeon: Larey Dresser, MD;  Location: [facility] ENDOSCOPY;  Service: Cardiovascular;  Laterality: N/A;  . COLONOSCOPY N/A 07/16/2017   Procedure: COLONOSCOPY;  Surgeon: Jerene Bears, MD;  Location: [facility];  Service: Gastroenterology;  Laterality: N/A;  . ESOPHAGOGASTRODUODENOSCOPY N/A 07/16/2017   Procedure: ESOPHAGOGASTRODUODENOSCOPY (EGD);  Surgeon: Jerene Bears, MD;  Location: [facility] ENDOSCOPY;  Service: Gastroenterology;  Laterality: N/A;  . INSERTION OF DIALYSIS CATHETER Right 07/13/2017   Procedure: INSERTION OF DIALYSIS CATHETER RIGHT INTERNAL JUGULAR;  Surgeon: Waynetta Sandy, MD;  Location: [facility];  Service: Vascular;  Laterality: Right;  . RIGHT HEART CATH N/A 06/29/2017   Procedure: RIGHT HEART CATH;  Surgeon: Larey Dresser, MD;  Location: [place] CV LAB;  Service: Cardiovascular;  Laterality: N/A;  . TEE WITHOUT CARDIOVERSION N/A 07/09/2017   Procedure: TRANSESOPHAGEAL ECHOCARDIOGRAM (TEE);  Surgeon: Larey Dresser, MD;  Location: [facility] ENDOSCOPY;  Service: Cardiovascular;  Laterality: N/A;    Current Medications: No outpatient prescriptions have been marked as taking for the 08/11/17 encounter (Appointment) with Richardson Dopp T, PA-C.     Allergies:   Patient has no known allergies.   Social History   Social History  . Marital status: Single    Spouse name: N/A  .  Number of children: N/A  . Years of education: N/A   Occupational History  .      on disability   Social History Main Topics  . Smoking status: Former Smoker    Packs/day: 1.00    Types: Cigarettes  . Smokeless tobacco: Former Systems developer     Comment: said he is not a regular smoker  . Alcohol use No  . Drug use: No  . Sexual activity: Not Currently   Other Topics Concern  . Not on file   Social History Narrative  . No narrative on file     Family Hx: The patient's family history is negative for Heart attack.  ROS:   Please see the history of present illness.    ROS All other systems reviewed and are negative.   EKGs/Labs/Other Test Reviewed:    EKG:  EKG is *** ordered today.  The ekg ordered today demonstrates ***  Recent Labs: 03/16/2017: NT-Pro BNP 6,530 05/30/2017: TSH 2.924 06/25/2017:  B Natriuretic Peptide 875.7 07/11/2017: Magnesium 2.1 08/10/2017: ALT 13; BUN 18; Creatinine, Ser 3.40; Hemoglobin 11.6; Platelets 80; Potassium 3.9; Sodium 135   Recent Lipid Panel Lab Results  Component Value Date/Time   CHOL 111 03/03/2017 11:52 AM   TRIG 66 03/03/2017 11:52 AM   HDL 36 (L) 03/03/2017 11:52 AM   CHOLHDL 3.1 03/03/2017 11:52 AM   CHOLHDL 4.5 02/27/2016 06:39 AM   LDLCALC 62 03/03/2017 11:52 AM    Physical Exam:    VS:  There were no vitals taken for this visit.    Wt Readings from Last 3 Encounters:  08/04/17 166 lb 12.8 oz (75.7 kg)  07/27/17 172 lb 6.4 oz (78.2 kg)  06/25/17 201 lb 12.8 oz (91.5 kg)     ***Physical Exam  ASSESSMENT:    1. Biventricular heart failure ([place])   2. Permanent atrial fibrillation ([place])   3. ESRD on dialysis ([place])   4. Anemia, unspecified type   5. S/P MVR (mitral valve replacement)   6. Tricuspid valve disease    PLAN:    In order of problems listed above:  Biventricular heart failure (HCC)  Permanent atrial fibrillation (HCC)  ESRD on dialysis ([place])  Anemia, unspecified type  S/P MVR (mitral valve  replacement)  Tricuspid valve disease*** 1. Chronic Systolic CHF:   NYHA 3.  His weight is up.  I suspect he is volume overloaded.                 -  BMET, BNP today             -  Increase Lasix to 120 mg Q am and 80 mg Q pm x 3 days >  Then Lasix 80 mg bid             -  BMET 1 week             -  CXR today    2. Dilated Cardiomyopathy:  Etiology unclear. Records from [facility] in [place] demonstrate EF 60-65% in 2012.  There is no record of a cardiac cath but we have to presume he had no significant CAD prior to his surgery.  It is possible his cardiomyopathy is related to tachycardia.  He is not on an ACE inhibitor or ARB secondary to worsening chronic kidney disease. As noted, he never brings his medications in and we cannot confirm what he is taking.  Continue beta-blocker, hydralazine, nitrates.               -  Arrange FU Echo             -  If EF < 35% consider referral to EP for ICD    3. Chronic AFib:   Rate seems to be reasonably controlled.  Continue Digoxin, Metoprolol succinate 150 mg twice a day   4. HTN:  Controlled.     5. CKD:  Stage 4-5. He has been referred to nephrology.  It is not clear when he will be seen by Nephrology.      6. Valvular Heart Disease:   S/p bioprosthetic MVR and TV repair 2012 at [facility] in [place].  Stable MVR at echo in 10/16.  Continue SBE prophylaxis.     7. Cough - Obtain CXR.     Dispo:  No Follow-up on file.   Medication Adjustments/Labs and Tests Ordered: Current medicines are reviewed at length with the patient today.  Concerns regarding medicines are outlined above.  Tests Ordered: No  orders of the defined types were placed in this encounter.  Medication Changes: No orders of the defined types were placed in this encounter.   Signed, Richardson Dopp, PA-C  08/10/2017 3:47 PM    [facility] [place], [place], [place]  [address] Phone: [phone]; Fax: [phone]

## 2017-08-10 NOTE — ED Triage Notes (Signed)
Pt here from dialysis , about 30 mins into dialysis pt went unresponsive  aed advised no shock , 2 mins of cpr performed , no meds given , pt arrived to ED awake ,

## 2017-08-10 NOTE — ED Notes (Signed)
Patient is stable and ready to be transport to the floor at this time.  Report was called to [facility].  Belongings taken with the patient to the floor.

## 2017-08-10 NOTE — Progress Notes (Signed)
Pharmacy Antibiotic Note  Michael [name] is a 56 y.o. male admitted on 08/10/2017 with pneumonia.  Pharmacy has been consulted for vancomycin and cefepime dosing. He is on dialysis 4x weekly, [facility]. Last session was today but he only received ~ 30 minutes before being brought to the ED.  Vancomycin pre HD level 15-25  Plan: 1) Vancomycin 1500mg  IV x 1 2) Follow up HD plans for further vancomycin maintenance doses 3) Cefepime 2g IV x 1 then 1g IV q24     Temp (24hrs), Avg:98.2 F (36.8 C), Min:98.2 F (36.8 C), Max:98.2 F (36.8 C)   Recent Labs Lab 08/10/17 1400 08/10/17 1416 08/10/17 1417  WBC 7.6  --   --   CREATININE 3.53* 3.40*  --   LATICACIDVEN  --   --  2.64*    Estimated Creatinine Clearance: 22.1 mL/min (A) (by C-G formula based on SCr of 3.4 mg/dL (H)).    No Known Allergies  Antimicrobials this admission: 9/24 Vancomycin >> 9/24 Cefepime >>  Dose adjustments this admission: n/a  Microbiology results: 9/24 blood cx >>   Thank you for allowing pharmacy to be a part of this patient's care.  Deboraha Sprang 08/10/2017 3:33 PM

## 2017-08-10 NOTE — Progress Notes (Signed)
Hypoglycemic Event  CBG: 65  Treatment: 12.5g of IV dextrose  Symptoms: Hungry   Follow-up CBG: DTOI:7124 CBG Result:109  Possible Reasons for Event: NPO status; pt had not eaten.   Comments/MD notified:[facility] on call; diet order obtained.    Jacqlyn Larsen, RN

## 2017-08-10 NOTE — Progress Notes (Signed)
ANTICOAGULATION CONSULT NOTE - Initial Consult  Pharmacy Consult for Coumadin Indication: atrial fibrillation  No Known Allergies  Vital Signs: Temp: 98.2 F (36.8 C) (09/24 1401) Temp Source: Oral (09/24 1401) BP: 99/77 (09/24 1930) Pulse Rate: 84 (09/24 1930)  Labs:  Recent Labs  08/10/17 1400 08/10/17 1416 08/10/17 1523 08/10/17 1812  HGB 10.2* 11.6*  --   --   HCT 32.4* 34.0*  --   --   PLT 80*  --   --   --   LABPROT  --   --  20.7*  --   INR  --   --  1.79  --   CREATININE 3.53* 3.40*  --   --   TROPONINI  --   --   --  0.05*    Estimated Creatinine Clearance: 22.1 mL/min (A) (by C-G formula based on SCr of 3.4 mg/dL (H)).   Medical History: Past Medical History:  Diagnosis Date  . Anemia of chronic disease   . Asthma    said he does not know  . Chronic kidney disease (CKD), stage IV (severe) ([place])   . Chronic systolic CHF (congestive heart failure) (HCC)    a. prior EF normal; b. Echo 10/16: Mild LVH, EF 30-35%, mitral valve bioprosthesis present without evidence of stenosis or regurgitation, severe LAE, mild RVE with mild to moderately reduced RVSF, moderate RAE  . Coronary artery disease   . Diabetes mellitus type 2 in obese ([place])   . Diabetes mellitus without complication ([place])   . Dyspnea   . GERD (gastroesophageal reflux disease)   . H/O tricuspid valve repair 2012  . History of echocardiogram    Echo at [facility] 4/12: mod LVH, EF 60-65%, mod LAE, tissue MVR ok, mod TR, mod pulmo HTN with RVSP 48 mmHg  . Hypertension   . Obstructive sleep apnea   . Paroxysmal atrial fibrillation (HCC)    s/p Maze procedure at time of MVR in 2011  . Pulmonary HTN ([place])   . S/P mitral valve replacement with porcine valve 2012    Assessment: 56yom on coumadin pta for afib/bioprosthetic MVR. INR on admit subtherapeutic at 1.79.  Home dose: 2.5mg  daily - last taken 9/23  Goal of Therapy:  INR 2-3 Monitor platelets by anticoagulation protocol: Yes    Plan:  1) Coumadin 5mg  tonight 2) Daily INR  Deboraha Sprang 08/10/2017,7:51 PM

## 2017-08-10 NOTE — ED Provider Notes (Addendum)
[facility] DEPT Provider Note   CSN: [id number] Arrival date & time: 08/10/17  1400     History   Chief Complaint Chief Complaint  Patient presents with  . Loss of Consciousness    HPI Michael [name] is a 56 y.o. male.  HPI  Patient presents to the emergency room for evaluation of cardiac arrest .  Patient has multiple medical problems. He was at dialysis today when according to the EMS report he had cardiac arrest. It's unclear what the rhythm was on the monitor. They placed in a ED on the patient and it did not advise shock. The patient had 2 minutes of CPR when eventually he regained consciousness. Patient is confused although his answer my questions now. He tells me that he was at dialysis yesterday. He is not sure exactly what brings him in today. He appears listless but denies any trouble with shortness of breath or abdominal pain. It's unclear if he's having any chest discomfort. Past Medical History:  Diagnosis Date  . Anemia of chronic disease   . Asthma    said he does not know  . Chronic kidney disease (CKD), stage IV (severe) ([place])   . Chronic systolic CHF (congestive heart failure) (HCC)    a. prior EF normal; b. Echo 10/16: Mild LVH, EF 30-35%, mitral valve bioprosthesis present without evidence of stenosis or regurgitation, severe LAE, mild RVE with mild to moderately reduced RVSF, moderate RAE  . Coronary artery disease   . Diabetes mellitus type 2 in obese ([place])   . Diabetes mellitus without complication ([place])   . Dyspnea   . GERD (gastroesophageal reflux disease)   . H/O tricuspid valve repair 2012  . History of echocardiogram    Echo at [facility] 4/12: mod LVH, EF 60-65%, mod LAE, tissue MVR ok, mod TR, mod pulmo HTN with RVSP 48 mmHg  . Hypertension   . Obstructive sleep apnea   . Paroxysmal atrial fibrillation (HCC)    s/p Maze procedure at time of MVR in 2011  . Pulmonary HTN ([place])   . S/P mitral valve replacement with porcine valve 2012      Patient Active Problem List   Diagnosis Date Noted  . Biventricular heart failure ([place]) 08/10/2017  . Benign neoplasm of ascending colon   . Rectal bleeding   . ESRD on dialysis ([place])   . Goals of care, counseling/discussion   . Palliative care by specialist   . Acute kidney injury ([place]) 06/25/2017  . Anasarca 05/30/2017  . HLD (hyperlipidemia) 10/05/2016  . Mouth bleeding 10/05/2016  . Bilateral knee pain 10/05/2016  . Petechial rash 10/05/2016  . Laceration of tongue   . Insomnia 06/10/2016  . Supratherapeutic INR 06/01/2016  . Acute hepatic encephalopathy 03/15/2016  . Liver cirrhosis ([place]) 03/15/2016  . Volume overload 03/15/2016  . Acute congestive heart failure ([place])   . Insulin dependent diabetes mellitus ([place])   . Acute renal failure (ARF) ([place]) 03/14/2016  . GERD (gastroesophageal reflux disease) 03/04/2016  . Aortic regurgitation 02/27/2016  . Viral gastroenteritis 02/27/2016  . A-fib ([place]) 02/26/2016  . Abdominal pain 02/25/2016  . Diabetes mellitus with complication ([place]) 93/90/3009  . Permanent atrial fibrillation ([place]) 02/25/2016  . Anemia, chronic disease 02/25/2016  . Pain in joint, ankle and foot 01/10/2016  . Asthma   . Acute hypoxemic respiratory failure ([place]) 12/29/2015  . Acute respiratory failure ([place]) 12/29/2015  . Chronic atrial fibrillation ([place]) 12/24/2015  . Dilated cardiomyopathy ([place]) 12/24/2015  . Polypharmacy  11/23/2015  . Gout 10/23/2015  . Encounter for therapeutic drug monitoring 10/19/2015  . Anemia - mild exacerbation 10/15/2015  . Tricuspid valve disease 10/09/2015  . Mitral valve disease   . CKD (chronic kidney disease), stage IV ([place]) 09/10/2015  . Calf pain 09/10/2015  . DM type 2 (diabetes mellitus, type 2) ([place]) 09/10/2015  . Essential hypertension 09/10/2015    Past Surgical History:  Procedure Laterality Date  . AV FISTULA PLACEMENT Left 07/13/2017   Procedure: LEFT ARM ARTERIOVENOUS (AV) FISTULA CREATION;  Surgeon:  Waynetta Sandy, MD;  Location: [place];  Service: Vascular;  Laterality: Left;  . CARDIAC SURGERY    . CARDIAC VALVE REPLACEMENT  2011  . CARDIOVERSION N/A 07/09/2017   Procedure: CARDIOVERSION;  Surgeon: Larey Dresser, MD;  Location: [facility] ENDOSCOPY;  Service: Cardiovascular;  Laterality: N/A;  . COLONOSCOPY N/A 07/16/2017   Procedure: COLONOSCOPY;  Surgeon: Jerene Bears, MD;  Location: [place];  Service: Gastroenterology;  Laterality: N/A;  . ESOPHAGOGASTRODUODENOSCOPY N/A 07/16/2017   Procedure: ESOPHAGOGASTRODUODENOSCOPY (EGD);  Surgeon: Jerene Bears, MD;  Location: [facility] ENDOSCOPY;  Service: Gastroenterology;  Laterality: N/A;  . INSERTION OF DIALYSIS CATHETER Right 07/13/2017   Procedure: INSERTION OF DIALYSIS CATHETER RIGHT INTERNAL JUGULAR;  Surgeon: Waynetta Sandy, MD;  Location: [place];  Service: Vascular;  Laterality: Right;  . RIGHT HEART CATH N/A 06/29/2017   Procedure: RIGHT HEART CATH;  Surgeon: Larey Dresser, MD;  Location: [place] CV LAB;  Service: Cardiovascular;  Laterality: N/A;  . TEE WITHOUT CARDIOVERSION N/A 07/09/2017   Procedure: TRANSESOPHAGEAL ECHOCARDIOGRAM (TEE);  Surgeon: Larey Dresser, MD;  [facility] END[facility]COPY;  Service: Cardiovascular;  Laterality: N/A;       Home Medications    Prior to Admission medications   Medication Sig Start Date End Date Taking? Authorizing Provider  ACCU-CHEK SOFTCLIX LANCETS lancets USE AS INSTRUCTED 3 TIMES DAILY 08/04/17  Yes [name], MD  acetaminophen-codeine (TYLENOL #3) 300-30 MG tablet Take 1 tablet by mouth every 12 (twelve) hours as needed for moderate pain. 08/04/17  Yes [name], MD  allopurinol (ZYLOPRIM) 300 MG tablet Take 0.5 tablets (150 mg total) by mouth daily. 07/28/17  Yes Shirley Friar, PA-C  atorvastatin (LIPITOR) 20 MG tablet Take 1 tablet (20 mg total) by mouth daily. 07/28/17  Yes Shirley Friar, PA-C  Blood Glucose Monitoring Suppl (ACCU-CHEK AVIVA  PLUS) w/Device KIT USE AS INSTRUCTED 3 TIMES DAILY 08/04/17  Yes [name], MD  calcium acetate (PHOSLO) 667 MG capsule Take 1 capsule (667 mg total) by mouth 3 (three) times daily with meals. 07/27/17  Yes Shirley Friar, PA-C  diclofenac sodium (VOLTAREN) 1 % GEL Apply 4 g topically 4 (four) times daily.   Yes [provider]  docusate sodium (COLACE) 100 MG capsule Take 1 capsule (100 mg total) by mouth 2 (two) times daily. 07/27/17  Yes Shirley Friar, PA-C  glucose blood (ACCU-CHEK AVIVA PLUS) test strip USE 3 TIMES DAILY AS DIRECTED. 08/04/17  Yes [name], MD  metoprolol succinate (TOPROL-XL) 25 MG 24 hr tablet Take 1 tablet (25 mg total) by mouth 2 (two) times daily. 07/27/17  Yes Shirley Friar, PA-C  midodrine (PROAMATINE) 5 MG tablet Take 1 tablet (5 mg total) by mouth 3 (three) times daily with meals. 07/27/17  Yes Shirley Friar, PA-C  omeprazole (PRILOSEC) 20 MG capsule Take 1 capsule (20 mg total) by mouth daily. 03/03/17  Yes [name], MD  OXYGEN Inhale  2 L into the lungs daily.   Yes [provider]  tiZANidine (ZANAFLEX) 4 MG tablet Take 1 tablet (4 mg total) by mouth every 8 (eight) hours as needed for muscle spasms. 08/04/17  Yes [name], MD  warfarin (COUMADIN) 2.5 MG tablet Take 1 tablet (2.5 mg total) by mouth daily at 6 PM. Or as directed by coumadin clinic 07/27/17  Yes Tillery, Satira Mccallum, PA-C  acetaminophen (TYLENOL) 650 MG CR tablet Take 650 mg by mouth every 6 (six) hours as needed for pain.    [provider]  colchicine 0.6 MG tablet Take 1.2 mg by mouth daily as needed for pain. 06/02/17   [provider]  Darbepoetin Alfa (ARANESP) 200 MCG/0.4ML SOSY injection Inject 0.4 mLs (200 mcg total) into the skin every Saturday at 6 PM. 08/01/17   Tillery, Satira Mccallum, PA-C  folic acid (FOLVITE) 1 MG tablet TAKE 1 TABLET BY MOUTH DAILY. 04/20/17   [name], MD  hydrOXYzine  (ATARAX/VISTARIL) 10 MG tablet Take 1 tablet (10 mg total) by mouth every 6 (six) hours as needed for itching or anxiety (if itching unrelieved by benadryl). 07/27/17   Shirley Friar, PA-C  Insulin Detemir (LEVEMIR FLEXTOUCH) 100 UNIT/ML Pen Inject 10 Units into the skin daily at 10 pm. 10/16/16   [name], MD    Family History Family History  Problem Relation Age of Onset  . Heart attack Neg Hx     Social History Social History  Substance Use Topics  . Smoking status: Former Smoker    Packs/day: 1.00    Types: Cigarettes  . Smokeless tobacco: Former Systems developer     Comment: said he is not a regular smoker  . Alcohol use No     Allergies   Patient has no known allergies.   Review of Systems Review of Systems  All other systems reviewed and are negative.    Physical Exam Updated Vital Signs BP 95/64   Pulse 82   Temp 98.2 F (36.8 C) (Oral)   Resp 18   SpO2 92%   Physical Exam  Constitutional: No distress.  HENT:  Head: Normocephalic and atraumatic.  Right Ear: External ear normal.  Left Ear: External ear normal.  Small laceration tip of the tongue  Eyes: Conjunctivae are normal. Right eye exhibits no discharge. Left eye exhibits no discharge. No scleral icterus.  Neck: Neck supple. No tracheal deviation present.  Cardiovascular: Normal rate, regular rhythm and intact distal pulses.   Pulmonary/Chest: Effort normal and breath sounds normal. No stridor. No respiratory distress. He has no wheezes. He has no rales. He exhibits tenderness.  Contusion chest wall  Abdominal: Soft. Bowel sounds are normal. He exhibits no distension. There is no tenderness. There is no rebound and no guarding.  Musculoskeletal: He exhibits edema. He exhibits no tenderness.  AV fistula left upper extremity, strong thrill, edema left upper extremity  Neurological: He is alert. No cranial nerve deficit (no facial droop, extraocular movements intact, no slurred speech) or sensory  deficit. He exhibits normal muscle tone. He displays no seizure activity. Coordination normal. GCS eye subscore is 4. GCS verbal subscore is 4. GCS motor subscore is 6.  Generalized weakness  Skin: Skin is warm and dry. No rash noted.  Psychiatric: He has a normal mood and affect.  Nursing note and vitals reviewed.    ED Treatments / Results  Labs (all labs ordered are listed, but only abnormal results are displayed) Labs Reviewed  CBC -  Abnormal; Notable for the following:       Result Value   RBC 3.79 (*)    Hemoglobin 10.2 (*)    HCT 32.4 (*)    RDW 19.1 (*)    Platelets 80 (*)    All other components within normal limits  COMPREHENSIVE METABOLIC PANEL - Abnormal; Notable for the following:    Sodium 133 (*)    Chloride 97 (*)    Creatinine, Ser 3.53 (*)    Calcium 8.7 (*)    Albumin 3.2 (*)    ALT 13 (*)    Alkaline Phosphatase 142 (*)    Total Bilirubin 2.8 (*)    GFR calc non Af Amer 18 (*)    GFR calc Af Amer 21 (*)    All other components within normal limits  I-STAT CG4 LACTIC ACID, ED - Abnormal; Notable for the following:    Lactic Acid, Venous 2.64 (*)    All other components within normal limits  I-STAT CHEM 8, ED - Abnormal; Notable for the following:    Chloride 94 (*)    Creatinine, Ser 3.40 (*)    Calcium, Ion 1.00 (*)    Hemoglobin 11.6 (*)    HCT 34.0 (*)    All other components within normal limits  CULTURE, BLOOD (ROUTINE X 2)  CULTURE, BLOOD (ROUTINE X 2)  URINE CULTURE  URINALYSIS, ROUTINE W REFLEX MICROSCOPIC  PROTIME-INR  I-STAT TROPONIN, ED  I-STAT CG4 LACTIC ACID, ED    EKG  EKG Interpretation  Date/Time:  Monday August 10 2017 14:49:13 EDT Ventricular Rate:  78 PR Interval:    QRS Duration: 130 QT Interval:  441 QTC Calculation: 503 R Axis:   -8 Text Interpretation:  Atrial fibrillation Right bundle branch block Abnormal T, consider ischemia, lateral leads atrial fibrillation is recurrent  but new since last EKG Confirmed by  Dorie Rank [phone]) on 08/10/2017 3:11:48 PM       Radiology Ct Head Wo Contrast  Result Date: 08/10/2017 CLINICAL DATA:  Patient fell and struck head. EXAM: CT HEAD WITHOUT CONTRAST TECHNIQUE: Contiguous axial images were obtained from the base of the skull through the vertex without intravenous contrast. COMPARISON:  None. FINDINGS: Brain: There is no evidence for acute hemorrhage, hydrocephalus, mass lesion, or abnormal extra-axial fluid collection. No definite CT evidence for acute infarction. Vascular: No hyperdense vessel or unexpected calcification. Skull: No evidence for fracture. No worrisome lytic or sclerotic lesion. Sinuses/Orbits: Visualized portions of the globes and intraorbital fat are unremarkable. The visualized paranasal sinuses and mastoid air cells are clear. Other: None. IMPRESSION: 1. No acute intracranial abnormality. Electronically Signed   By: Misty Stanley M.D.   On: 08/10/2017 16:04   Dg Chest Port 1 View  Result Date: 08/10/2017 CLINICAL DATA:  Patient unresponsive during dialysis. EXAM: PORTABLE CHEST 1 VIEW COMPARISON:  Chest radiograph 07/13/2017 FINDINGS: Central venous catheter tip projects over the superior vena cava. Stable cardiomegaly status post median sternotomy. Heterogeneous opacities right lung base. Small right pleural effusion. IMPRESSION: Small right pleural effusion with heterogeneous opacities right lung base which may represent infection or atelectasis. Electronically Signed   By: Lovey Newcomer M.D.   On: 08/10/2017 14:31    Procedures .Critical Care Performed by: Dorie Rank Authorized by: Dorie Rank   Critical care provider statement:    Critical care time (minutes):  30   Critical care was necessary to treat or prevent imminent or life-threatening deterioration of the following conditions: s/p cardiac arrest.  Critical care was time spent personally by me on the following activities:  Discussions with consultants, evaluation of patient's response  to treatment, examination of patient, ordering and performing treatments and interventions, ordering and review of laboratory studies, ordering and review of radiographic studies, pulse oximetry, re-evaluation of patient's condition, obtaining history from patient or surrogate and review of old charts    (including critical care time)  Medications Ordered in ED Medications  sodium chloride 0.9 % bolus 1,000 mL (not administered)    And  sodium chloride 0.9 % bolus 1,000 mL (1,000 mLs Intravenous New Bag/Given 08/10/17 1520)    And  sodium chloride 0.9 % bolus 500 mL (0 mLs Intravenous Stopped 08/10/17 1520)  ceFEPIme (MAXIPIME) 2 g in dextrose 5 % 50 mL IVPB (not administered)  vancomycin (VANCOCIN) 1,500 mg in sodium chloride 0.9 % 500 mL IVPB (not administered)  ceFEPIme (MAXIPIME) 1 g in dextrose 5 % 50 mL IVPB (not administered)  ferric gluconate (NULECIT) 125 mg in sodium chloride 0.9 % 100 mL IVPB (not administered)     Initial Impression / Assessment and Plan / ED Course  I have reviewed the triage vital signs and the nursing notes.  Pertinent labs & imaging results that were available during my care of the patient were reviewed by me and considered in my medical decision making (see chart for details).  Clinical Course as of Aug 10 1613  Mon Aug 10, 2017  1503 Pt is awake. Using his cellphone.  [JK]  1512 BP is greater than 100.  Mentation is improving.  Lactic acid elevated but doubt sepsis, most likely related to the CPR, cardiac arrest.  [JK]    Clinical Course User Index [JK] Dorie Rank, MD    Pt presented to the emergency room after a possible cardiac arrest at dialysis. Patient had bystander CPR with spontaneous circulation.  No seizure activity was mentioned on the however that is  a possibility.  Patient did bite his tongue and he seemed confused post resuscitation, ?pos tictal.   He did initially present hypotensive in reviewing his previous vital signs he seems to  chronically have a low blood pressure. Patient's laboratory tests are unremarkable with exception of a mild increase of lactic acidosis. Chest x-ray shows possibility of early infiltrate versus atelectasis. He was given a fluid bolus and started on antibiotics empirically to cover for the possibility of sepsis although overall I doubt sepsis.  Cardiology was consulted and they will evaluate the patient.  Plan and admission for further monitoring.  Final Clinical Impressions(s) / ED Diagnoses   Final diagnoses:  Cardiac arrest ([place])  Chronic renal failure, unspecified CKD stage  Lactic acidosis        Dorie Rank, MD 08/10/17 1614

## 2017-08-10 NOTE — H&P (Signed)
Date: 08/10/2017               Patient Name:  Michael [name] MRN: [id number]  DOB: Aug 24, 1961 Age / Sex: 56 y.o., male   PCP: [name], MD         Medical Service: Internal Medicine Teaching Service         Attending Physician: Dr. Sid Falcon, MD    First Contact: Dr. Trilby Drummer Pager: [phone]  Second Contact: Dr. Reesa Chew Pager: [phone]       After Hours (After 5p/  First Contact Pager: [phone]  weekends / holidays): Second Contact Pager: [phone]   Chief Complaint: Syncope and Collapse  History of Present Illness: 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 Fib, HFpEF, GERD, S/P Mitral Valve replacement and Tricuspid valve repair, recently admitted from 8/9 - 9/10 for Acute on Chronic HF and AKI who presents following an episode of syncope and collapse at dialysis today. He has no memory of the event and history was obtained via chart review. He was approximately 30 minutes into his dialysis session when he became unresponsive and "tensed up". Staff told EMS that the patient also became pulseless and apneic. CPR was performed and an AED was applied. No shock was advised and the patient became responsive and started breathing on his own after several minutes. Blood was suctioned from the patient's mouth and he was later found to have bit his tongue. Patient was able to communicate with EMS and stand with assistance. He states that he was somewhat confused upon awakening and that prior to the event he just felt sleepy. He denied feeling dizziness, lightheadedness, or palpitations. He additionally complains of several days of pain and increasing swelling in his left arm distal to his fistula site (place on 07/13/17).  In the ED, BP 90s-100s/60s-70s, HR 60s-80s, RR 16-18, 95-97% 2L Bud, Temp 98.2. CBC showed Hgb stable at 10.2; CMP showed Cr 3.53 (ESRD), ALP 142, T.Bili 2.8; Troponin negative; Lactate 2.64; INR 1.79; with UA, Urine Culture, and  Blood Cultures Pending. He received 2.5L IVF, Vancomycin 1.5g, Cefipime 2g, and Norco 5-325 x2. Patient to be admitted for further workup and treatment.  Meds:  Current Meds  Medication Sig  . ACCU-CHEK SOFTCLIX LANCETS lancets USE AS INSTRUCTED 3 TIMES DAILY  . acetaminophen-codeine (TYLENOL #3) 300-30 MG tablet Take 1 tablet by mouth every 12 (twelve) hours as needed for moderate pain.  Marland Kitchen allopurinol (ZYLOPRIM) 300 MG tablet Take 0.5 tablets (150 mg total) by mouth daily.  Marland Kitchen atorvastatin (LIPITOR) 20 MG tablet Take 1 tablet (20 mg total) by mouth daily.  . Blood Glucose Monitoring Suppl (ACCU-CHEK AVIVA PLUS) w/Device KIT USE AS INSTRUCTED 3 TIMES DAILY  . calcium acetate (PHOSLO) 667 MG capsule Take 1 capsule (667 mg total) by mouth 3 (three) times daily with meals.  . diclofenac sodium (VOLTAREN) 1 % GEL Apply 4 g topically 4 (four) times daily.  Marland Kitchen docusate sodium (COLACE) 100 MG capsule Take 1 capsule (100 mg total) by mouth 2 (two) times daily.  Marland Kitchen glucose blood (ACCU-CHEK AVIVA PLUS) test strip USE 3 TIMES DAILY AS DIRECTED.  Marland Kitchen metoprolol succinate (TOPROL-XL) 25 MG 24 hr tablet Take 1 tablet (25 mg total) by mouth 2 (two) times daily.  . midodrine (PROAMATINE) 5 MG tablet Take 1 tablet (5 mg total) by mouth 3 (three) times daily with meals.  Marland Kitchen omeprazole (PRILOSEC) 20 MG capsule  Take 1 capsule (20 mg total) by mouth daily.  . OXYGEN Inhale 2 L into the lungs daily.  Marland Kitchen tiZANidine (ZANAFLEX) 4 MG tablet Take 1 tablet (4 mg total) by mouth every 8 (eight) hours as needed for muscle spasms.  Marland Kitchen warfarin (COUMADIN) 2.5 MG tablet Take 1 tablet (2.5 mg total) by mouth daily at 6 PM. Or as directed by coumadin clinic     Allergies: Allergies as of 08/10/2017  . (No Known Allergies)   Past Medical History:  Diagnosis Date  . Anemia of chronic disease   . Asthma    said he does not know  . Chronic kidney disease (CKD), stage IV (severe) ([place])   . Chronic systolic CHF (congestive heart  failure) (HCC)    a. prior EF normal; b. Echo 10/16: Mild LVH, EF 30-35%, mitral valve bioprosthesis present without evidence of stenosis or regurgitation, severe LAE, mild RVE with mild to moderately reduced RVSF, moderate RAE  . Coronary artery disease   . Diabetes mellitus type 2 in obese ([place])   . Diabetes mellitus without complication ([place])   . Dyspnea   . GERD (gastroesophageal reflux disease)   . H/O tricuspid valve repair 2012  . History of echocardiogram    Echo at [facility] in [facility] 4/12: mod LVH, EF 60-65%, mod LAE, tissue MVR ok, mod TR, mod pulmo HTN with RVSP 48 mmHg  . Hypertension   . Obstructive sleep apnea   . Paroxysmal atrial fibrillation (HCC)    s/p Maze procedure at time of MVR in 2011  . Pulmonary HTN ([place])   . S/P mitral valve replacement with porcine valve 2012    Family History:  Family History  Problem Relation Age of Onset  . Heart attack Neg Hx    Social History:  Social History  Substance Use Topics  . Smoking status: Former Smoker    Packs/day: 1.00    Types: Cigarettes  . Smokeless tobacco: Former Systems developer     Comment: said he is not a regular smoker  . Alcohol use No    Review of Systems: A complete ROS was negative except as per HPI.   Physical Exam: Blood pressure 102/68, pulse 82, temperature 98.2 F (36.8 C), temperature source Oral, resp. rate 16, SpO2 97 %. Physical Exam  Constitutional: He is oriented to person, place, and time. He appears well-developed and well-nourished.  HENT:  Head: Normocephalic and atraumatic.  Eyes: EOM are normal. Right eye exhibits no discharge. Left eye exhibits no discharge.  Cardiovascular: Normal rate.   Irregularly irregular rhythm  Pulmonary/Chest: Effort normal and breath sounds normal. No respiratory distress.  Abdominal: Soft. Bowel sounds are normal. He exhibits no distension. There is no tenderness.  Musculoskeletal:  LUE fistula without Thrill No LE edema  Neurological: He is alert and  oriented to person, place, and time.  Skin: Skin is dry.  Erythema and Edema of LUE distal to fistula site.    EKG: personally reviewed my interpretation is atrial fibrillation, RBBB, Abnormal T waves in lateral leads.   CXR: personally reviewed and I agree with:  - Small right pleural effusion with heterogeneous opacities right lung base which may represent infection or atelectasis.  Assessment & Plan by Problem:  Mr. 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 Fib, HFpEF, GERD, S/P Mitral Valve replacement and Tricuspid valve repair, recently admitted from 8/9 - 9/10 for Acute on Chronic HF and AKI who  presents following an episode of syncope and collapse at dialysis  Syncope and Collapse: Patient present following loss of consciousness during dialysis. Bystanders suspected sudden cardiac arrest as he was reportedly apneic and pulseless. CPR was performed and patient spontaneously regained consciousness within minutes. Differential includes sudden cardiac arrest, seizure (patient was seen with his body tensed before loosing consciousness and bit his tongue, but without history of seizure or reported convulsions); and pulmonary embolism [at risk with potential LUE DVT (as below), Well's 4.5, 16.2% risk, [place] 9pts, 20-30% incidence]. - Cardiology Consult  - Cardiac monitoring - CT angiogram - Echocardiogram - Myoview - Trend Troponin - Voltaren gel - Norco 80m, 1-2 q4h PRN - EKG in AM  - BMP in AM  Left Upper extremity Pain: Patient complained of 2 days of increasing left forearm swelling and pain. Patient had left antecubital fistula placed on 8/27. Fistula is without thrill and likely clotted. Questionable cellulitis  - Consult Vascular surgery in the AM  Code Sepsis: Patient presented with hypotension and elevated Lactate 2.64. CXR showed heterogeneous opacities at right lung base. Questionable Cellulitis at LUE. Vancomycin and cefepime started  in the ED. Afebrile, WBC 7.6. Will consider discontinuing antibiotics in AM. - Vancomycin (per pharmacy) - Cefepime 1g q24h - Urine Culture - Blood Culture - CBC in AM  DM: On 10 units Levemir qhs at home, currently has run out.  - SSI  ESRD on HD MWFS:  - Nephrology on board - Phoslo 6673mTID AC - Nulecit 12521mn NS MWF (HD) - Midodrine 5mg79mD AC, for hypotension  Chronic A. Fib: Rate controlled. On Warfarin, Subtherapeutic on admission. - Metoprolol succinate 25mg22m - Warfarin (per pharmacy)  HLD: - Atorvastatin 20mg 32my  FEN: Renal, Carb Modified DVT Prophylaxis: Warfarin Code Status: Full Code  Dispo: Admit patient to Inpatient with expected length of stay greater than 2 midnights.  Signed: MelvinNeva Seat/24/2018, 6:03 PM  Pager: [phone]

## 2017-08-10 NOTE — Consult Note (Signed)
[facility] Renal Consultation Note    Indication for Consultation:  Management of ESRD/hemodialysis; anemia, hypertension/volume and secondary hyperparathyroidism PCP:  HPI: Michael [name] is a 56 y.o. male. 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  Past medical history significant for chronic systolic HF, CAD, DM type 2, PAF, mitral valve/tricuspid valve repair, s/p maze procedure, on coumadin.  Patient has been tolerating HD well.   Michael [name] presented to the ED, via EMS for suspected cardiac arrest.  During dialysis patient became unresponsive, placed on AED, no shock advised, CPR performed and patient regained consciousness.  Patient seen and examined at bedside.  Admits to fever and chills at home over the past few days.  Does not recall event this afternoon.   Pertinent findings in the ED include elevated lactic acid 2.64, troponin of 0.03, CXR shows small right pleural effusion w/hetrogeneous opacities in R L base, CT chest pending. Admitted for further evaluation.    Past Medical History:  Diagnosis Date  . Anemia of chronic disease   . Asthma    said he does not know  . Chronic kidney disease (CKD), stage IV (severe) ([place])   . Chronic systolic CHF (congestive heart failure) (HCC)    a. prior EF normal; b. Echo 10/16: Mild LVH, EF 30-35%, mitral valve bioprosthesis present without evidence of stenosis or regurgitation, severe LAE, mild RVE with mild to moderately reduced RVSF, moderate RAE  . Coronary artery disease   . Diabetes mellitus type 2 in obese ([place])   . Diabetes mellitus without complication ([place])   . Dyspnea   . GERD (gastroesophageal reflux disease)   . H/O tricuspid valve repair 2012  . History of echocardiogram    Echo at [facility] in [facility] 4/12: mod LVH, EF 60-65%, mod LAE, tissue MVR ok, mod TR, mod pulmo HTN with RVSP 48 mmHg  . Hypertension   . Obstructive sleep apnea   . Paroxysmal atrial  fibrillation (HCC)    s/p Maze procedure at time of MVR in 2011  . Pulmonary HTN ([place])   . S/P mitral valve replacement with porcine valve 2012   Past Surgical History:  Procedure Laterality Date  . AV FISTULA PLACEMENT Left 07/13/2017   Procedure: LEFT ARM ARTERIOVENOUS (AV) FISTULA CREATION;  Surgeon: Waynetta Sandy, MD;  Location: [place];  Service: Vascular;  Laterality: Left;  . CARDIAC SURGERY    . CARDIAC VALVE REPLACEMENT  2011  . CARDIOVERSION N/A 07/09/2017   Procedure: CARDIOVERSION;  Surgeon: Larey Dresser, MD;  Location: [facility] ENDOSCOPY;  Service: Cardiovascular;  Laterality: N/A;  . COLONOSCOPY N/A 07/16/2017   Procedure: COLONOSCOPY;  Surgeon: Jerene Bears, MD;  Location: [place];  Service: Gastroenterology;  Laterality: N/A;  . ESOPHAGOGASTRODUODENOSCOPY N/A 07/16/2017   Procedure: ESOPHAGOGASTRODUODENOSCOPY (EGD);  Surgeon: Jerene Bears, MD;  Location: [facility] ENDOSCOPY;  Service: Gastroenterology;  Laterality: N/A;  . INSERTION OF DIALYSIS CATHETER Right 07/13/2017   Procedure: INSERTION OF DIALYSIS CATHETER RIGHT INTERNAL JUGULAR;  Surgeon: Waynetta Sandy, MD;  Location: [place];  Service: Vascular;  Laterality: Right;  . RIGHT HEART CATH N/A 06/29/2017   Procedure: RIGHT HEART CATH;  Surgeon: Larey Dresser, MD;  Location: [place] CV LAB;  Service: Cardiovascular;  Laterality: N/A;  . TEE WITHOUT CARDIOVERSION N/A 07/09/2017   Procedure: TRANSESOPHAGEAL ECHOCARDIOGRAM (TEE);  Surgeon: Larey Dresser, MD;  Location: [facility] ENDOSCOPY;  Service: Cardiovascular;  Laterality: N/A;   Family History  Problem Relation  Age of Onset  . Heart attack Neg Hx    Social History:  reports that he has quit smoking. His smoking use included Cigarettes. He smoked 1.00 pack per day. He has quit using smokeless tobacco. He reports that he does not drink alcohol or use drugs. No Known Allergies Prior to Admission medications   Medication Sig Start Date End Date Taking?  Authorizing Provider  ACCU-CHEK SOFTCLIX LANCETS lancets USE AS INSTRUCTED 3 TIMES DAILY 08/04/17  Yes [name], MD  acetaminophen-codeine (TYLENOL #3) 300-30 MG tablet Take 1 tablet by mouth every 12 (twelve) hours as needed for moderate pain. 08/04/17  Yes [name], MD  allopurinol (ZYLOPRIM) 300 MG tablet Take 0.5 tablets (150 mg total) by mouth daily. 07/28/17  Yes Shirley Friar, PA-C  atorvastatin (LIPITOR) 20 MG tablet Take 1 tablet (20 mg total) by mouth daily. 07/28/17  Yes Shirley Friar, PA-C  Blood Glucose Monitoring Suppl (ACCU-CHEK AVIVA PLUS) w/Device KIT USE AS INSTRUCTED 3 TIMES DAILY 08/04/17  Yes [name], MD  calcium acetate (PHOSLO) 667 MG capsule Take 1 capsule (667 mg total) by mouth 3 (three) times daily with meals. 07/27/17  Yes Shirley Friar, PA-C  diclofenac sodium (VOLTAREN) 1 % GEL Apply 4 g topically 4 (four) times daily.   Yes [provider]  docusate sodium (COLACE) 100 MG capsule Take 1 capsule (100 mg total) by mouth 2 (two) times daily. 07/27/17  Yes Shirley Friar, PA-C  glucose blood (ACCU-CHEK AVIVA PLUS) test strip USE 3 TIMES DAILY AS DIRECTED. 08/04/17  Yes [name], MD  metoprolol succinate (TOPROL-XL) 25 MG 24 hr tablet Take 1 tablet (25 mg total) by mouth 2 (two) times daily. 07/27/17  Yes Shirley Friar, PA-C  midodrine (PROAMATINE) 5 MG tablet Take 1 tablet (5 mg total) by mouth 3 (three) times daily with meals. 07/27/17  Yes Shirley Friar, PA-C  omeprazole (PRILOSEC) 20 MG capsule Take 1 capsule (20 mg total) by mouth daily. 03/03/17  Yes [name], MD  OXYGEN Inhale 2 L into the lungs daily.   Yes [provider]  tiZANidine (ZANAFLEX) 4 MG tablet Take 1 tablet (4 mg total) by mouth every 8 (eight) hours as needed for muscle spasms. 08/04/17  Yes [name], MD  warfarin (COUMADIN) 2.5 MG tablet Take 1 tablet (2.5 mg total) by mouth daily at 6 PM. Or as directed by  coumadin clinic 07/27/17  Yes Tillery, Satira Mccallum, PA-C  acetaminophen (TYLENOL) 650 MG CR tablet Take 650 mg by mouth every 6 (six) hours as needed for pain.    [provider]  colchicine 0.6 MG tablet Take 1.2 mg by mouth daily as needed for pain. 06/02/17   [provider]  Darbepoetin Alfa (ARANESP) 200 MCG/0.4ML SOSY injection Inject 0.4 mLs (200 mcg total) into the skin every Saturday at 6 PM. 08/01/17   Tillery, Satira Mccallum, PA-C  folic acid (FOLVITE) 1 MG tablet TAKE 1 TABLET BY MOUTH DAILY. 04/20/17   [name], MD  hydrOXYzine (ATARAX/VISTARIL) 10 MG tablet Take 1 tablet (10 mg total) by mouth every 6 (six) hours as needed for itching or anxiety (if itching unrelieved by benadryl). 07/27/17   Shirley Friar, PA-C  Insulin Detemir (LEVEMIR FLEXTOUCH) 100 UNIT/ML Pen Inject 10 Units into the skin daily at 10 pm. 10/16/16   [name], MD   Current Facility-Administered Medications  Medication Dose Route Frequency Provider Last Rate Last Dose  . [START ON 08/11/2017]  ceFEPIme (MAXIPIME) 1 g in dextrose 5 % 50 mL IVPB  1 g Intravenous Q24H Otilio Miu, RPH      . ceFEPIme (MAXIPIME) 2 g in dextrose 5 % 50 mL IVPB  2 g Intravenous Once Dorie Rank, MD      . Derrill Memo ON 08/12/2017] ferric gluconate (NULECIT) 125 mg in sodium chloride 0.9 % 100 mL IVPB  125 mg Intravenous Q M,W,F-HD Penninger, Ria Comment, PA      . sodium chloride 0.9 % bolus 1,000 mL  1,000 mL Intravenous Once Dorie Rank, MD      . vancomycin (VANCOCIN) 1,500 mg in sodium chloride 0.9 % 500 mL IVPB  1,500 mg Intravenous Once Otilio Miu, [facility]       Current Outpatient Prescriptions  Medication Sig Dispense Refill  . ACCU-CHEK SOFTCLIX LANCETS lancets USE AS INSTRUCTED 3 TIMES DAILY 100 each 5  . acetaminophen-codeine (TYLENOL #3) 300-30 MG tablet Take 1 tablet by mouth every 12 (twelve) hours as needed for moderate pain. 40 tablet 1  . allopurinol (ZYLOPRIM) 300 MG tablet Take 0.5  tablets (150 mg total) by mouth daily. 15 tablet 3  . atorvastatin (LIPITOR) 20 MG tablet Take 1 tablet (20 mg total) by mouth daily. 30 tablet 3  . Blood Glucose Monitoring Suppl (ACCU-CHEK AVIVA PLUS) w/Device KIT USE AS INSTRUCTED 3 TIMES DAILY 1 kit 0  . calcium acetate (PHOSLO) 667 MG capsule Take 1 capsule (667 mg total) by mouth 3 (three) times daily with meals. 90 capsule 3  . diclofenac sodium (VOLTAREN) 1 % GEL Apply 4 g topically 4 (four) times daily.    Marland Kitchen docusate sodium (COLACE) 100 MG capsule Take 1 capsule (100 mg total) by mouth 2 (two) times daily. 10 capsule 0  . glucose blood (ACCU-CHEK AVIVA PLUS) test strip USE 3 TIMES DAILY AS DIRECTED. 100 each 12  . metoprolol succinate (TOPROL-XL) 25 MG 24 hr tablet Take 1 tablet (25 mg total) by mouth 2 (two) times daily. 60 tablet 6  . midodrine (PROAMATINE) 5 MG tablet Take 1 tablet (5 mg total) by mouth 3 (three) times daily with meals. 90 tablet 6  . omeprazole (PRILOSEC) 20 MG capsule Take 1 capsule (20 mg total) by mouth daily. 30 capsule 5  . OXYGEN Inhale 2 L into the lungs daily.    Marland Kitchen tiZANidine (ZANAFLEX) 4 MG tablet Take 1 tablet (4 mg total) by mouth every 8 (eight) hours as needed for muscle spasms. 60 tablet 1  . warfarin (COUMADIN) 2.5 MG tablet Take 1 tablet (2.5 mg total) by mouth daily at 6 PM. Or as directed by coumadin clinic 30 tablet 0  . acetaminophen (TYLENOL) 650 MG CR tablet Take 650 mg by mouth every 6 (six) hours as needed for pain.    Marland Kitchen colchicine 0.6 MG tablet Take 1.2 mg by mouth daily as needed for pain.  0  . Darbepoetin Alfa (ARANESP) 200 MCG/0.4ML SOSY injection Inject 0.4 mLs (200 mcg total) into the skin every Saturday at 6 PM. 6.96 mL   . folic acid (FOLVITE) 1 MG tablet TAKE 1 TABLET BY MOUTH DAILY. 30 tablet 2  . hydrOXYzine (ATARAX/VISTARIL) 10 MG tablet Take 1 tablet (10 mg total) by mouth every 6 (six) hours as needed for itching or anxiety (if itching unrelieved by benadryl). 30 tablet 0  .  Insulin Detemir (LEVEMIR FLEXTOUCH) 100 UNIT/ML Pen Inject 10 Units into the skin daily at 10 pm. 15 mL 11   Labs: Basic  Metabolic Panel:  Recent Labs Lab 08/10/17 1400 08/10/17 1416  NA 133* 135  K 3.9 3.9  CL 97* 94*  CO2 26  --   GLUCOSE 93 89  BUN 15 18  CREATININE 3.53* 3.40*  CALCIUM 8.7*  --    Liver Function Tests:  Recent Labs Lab 08/10/17 1400  AST 30  ALT 13*  ALKPHOS 142*  BILITOT 2.8*  PROT 6.8  ALBUMIN 3.2*   No results for input(s): LIPASE, AMYLASE in the last 168 hours. No results for input(s): AMMONIA in the last 168 hours. CBC:  Recent Labs Lab 08/10/17 1400 08/10/17 1416  WBC 7.6  --   HGB 10.2* 11.6*  HCT 32.4* 34.0*  MCV 85.5  --   PLT 80*  --    Cardiac Enzymes: No results for input(s): CKTOTAL, CKMB, CKMBINDEX, TROPONINI in the last 168 hours. CBG: No results for input(s): GLUCAP in the last 168 hours. Iron Studies: No results for input(s): IRON, TIBC, TRANSFERRIN, FERRITIN in the last 72 hours. Studies/Results: Ct Head Wo Contrast  Result Date: 08/10/2017 CLINICAL DATA:  Patient fell and struck head. EXAM: CT HEAD WITHOUT CONTRAST TECHNIQUE: Contiguous axial images were obtained from the base of the skull through the vertex without intravenous contrast. COMPARISON:  None. FINDINGS: Brain: There is no evidence for acute hemorrhage, hydrocephalus, mass lesion, or abnormal extra-axial fluid collection. No definite CT evidence for acute infarction. Vascular: No hyperdense vessel or unexpected calcification. Skull: No evidence for fracture. No worrisome lytic or sclerotic lesion. Sinuses/Orbits: Visualized portions of the globes and intraorbital fat are unremarkable. The visualized paranasal sinuses and mastoid air cells are clear. Other: None. IMPRESSION: 1. No acute intracranial abnormality. Electronically Signed   By: Misty Stanley M.D.   On: 08/10/2017 16:04   Dg Chest Port 1 View  Result Date: 08/10/2017 CLINICAL DATA:  Patient  unresponsive during dialysis. EXAM: PORTABLE CHEST 1 VIEW COMPARISON:  Chest radiograph 07/13/2017 FINDINGS: Central venous catheter tip projects over the superior vena cava. Stable cardiomegaly status post median sternotomy. Heterogeneous opacities right lung base. Small right pleural effusion. IMPRESSION: Small right pleural effusion with heterogeneous opacities right lung base which may represent infection or atelectasis. Electronically Signed   By: Lovey Newcomer M.D.   On: 08/10/2017 14:31    ROS: As per HPI otherwise negative.   Physical Exam: Vitals:   08/10/17 1401 08/10/17 1530  BP: (!) 84/64 95/64  Pulse: 94 82  Resp: 18 18  Temp: 98.2 F (36.8 C)   TempSrc: Oral   SpO2: 94% 92%     General: WDWN NAD Head: NCAT sclera not icteric MMM Neck: Supple.  Lungs: + rhonchi bilaterally. Breathing is unlabored. Heart: RRR with S1 S2, +murmur.  Abdomen: soft, NT + BS  Lower extremities: 2+ b/l edema  Neuro: A & O  X 3. Moves all extremities spontaneously. Psych:  Responds to questions appropriately with a normal affect. Dialysis Access: TDC, LU AVF - +thrill, +bruit, swelling in L forearm and around incision   Dialysis Orders: MWFS 4hrs, BFR 400, DFR 800, EDW 75kg, 2K/2.25Ca, TDC  Heparin 4200 units IV venofer 51m IV qwk - last 9/19 Mircera 2034m IV q2wks - last 9/19  Last labs: 9/21: Hgb 9.9, (/12: Hgb 10.2, TSAT 12%, K 4.4, Ca 9.7, P 5.7, Alb 3.5  Assessment/Plan: 1. Unresponsive - suspected Cardiac arrest    -Negative CT head, no new findings on EKG.  -Recommend Cardiology consult as well as EP eval for possible arrythmia  2. Lung opacities - pneumonia vs atelectasis  -CXR showed small right pleural effusion w/hetrogeneous opacities in R L base.   -CT chest ordered, results pending.  -vancomycin and cefepime started 3.  ESRD- MWF   -Inpatient dialysis on Wed, unless CT reveals urgent need.  -Swelling around AVF incision and in L forearm, VVS consult for evaluation.   4.  Hypertension/volume    -BP soft, on midodrine w/HD.  Titrate down volume slowly. 5.  Anemia  -   -Hgb stable, 11.6 on 9/24 - follow trends  -TSAT 12%, IV Iron 126m x 6HD 6.  Metabolic bone disease   -Ca and P stable. Mild hyperphosphatemia, continue outpatient Phoslo 1ac TID  -Needs PTH 7.  Nutrition - Renavite, Nepro TID, Renal diet.  8. 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 A. Fib - persistent  -on coumadin and metoprolol  10. DM on insulin   LJen Mow PA-C [facility] 08/10/2017, 4:17 PM   I have seen and examined this patient and agree with plan and assessment in the above note with renal recommendations/intervention highlighted.  Pt became unresponsive at HD as well as pulseless and CPR initiated and he eventually improved and regained a pulse.  He reports not feeling well with some fevers and chills at home prior to this episode.  He now reports chest pain at the site of CPR.  He only received 30-40 minutes of dialysis but is in no respiratory distress and potassium levels are stable. JGovernor RooksColadonato,MD 08/10/2017 4:57 PM

## 2017-08-10 NOTE — ED Notes (Signed)
Admitting MDs at bedside.

## 2017-08-10 NOTE — Consult Note (Signed)
Cardiology Consultation:   Patient ID: Michael [name]; [id number]; Sep 26, 1961   Admit date: 08/10/2017 Date of Consult: 08/10/2017  Primary Care Provider: [name], MD Primary Cardiologist: Dr. Harrington Challenger HF Dr. Aundra Dubin  Primary Electrophysiologist:  NA   Patient Profile:   Michael [name] is a 56 y.o. male with a hx of MV disorder, with MVR with bioprosthetic MV, 2012 at [facility] in [place], also tricuspid valve repair., CAD, PAF, ESRD on HD since recent admit earlier this month, OSA, DM, HTN, HLD, CHF, pulmonary HTN, anemia who is being seen today for the evaluation of seizure vs. arrest at the request of Dr. Tomi Bamberger.  History of Present Illness:   [name] has a hx of MV disorder, with MVR with bioprosthetic MV, 2012 at [facility] in [place], also tricuspid valve repair., CAD, PAF now persistent-chronic ( DCCV last admit but went back into a fib. So no further attempts for DCCV ), ESRD on HD since first of Sept. , OSA- was to plan for outpt sleep study , DM, HTN, HLD, CHF, pulmonary HTN, anemia with recent discharge 05/19/49 for diastolic HF, prominent RV failure and found to have severe TR   Now in ER alert and oriented.  Pt was in dialysis this AM and 30 min into dialysis he became unresponsive, AED placed and no shock, 2 min of CPR performed and pt regained consciousness.     Pt with fever and chills at home. Lactic acid here 2.65, troponin 0.03, CXR sm. Rt pl effusion w/hetrogeneous opacities in R L base--CT has been ordered.   WBC 7.6, Hgb 10.2, hct 32.4  plts 80 INR 1.79 on coumadin down from 2.2  Na 133 to 135, K+ 3.9 Cr 3.40  Alk phos 142,  Blood cultures pending  CT head no acute abnormality  EKG a fib rate controlled with lateral ST depression. RBBB similar to previous EKGs.  Was discharged in rate controlled a fib.    Denies any chest pain .denies any SOB.   Past Medical History:  Diagnosis Date  . Anemia of chronic disease   . Asthma    said he does not know  .  Chronic kidney disease (CKD), stage IV (severe) ([place])   . Chronic systolic CHF (congestive heart failure) (HCC)    a. prior EF normal; b. Echo 10/16: Mild LVH, EF 30-35%, mitral valve bioprosthesis present without evidence of stenosis or regurgitation, severe LAE, mild RVE with mild to moderately reduced RVSF, moderate RAE  . Coronary artery disease   . Diabetes mellitus type 2 in obese ([place])   . Diabetes mellitus without complication ([place])   . Dyspnea   . GERD (gastroesophageal reflux disease)   . H/O tricuspid valve repair 2012  . History of echocardiogram    Echo [facility] in [facility] 4/12: mod LVH, EF 60-65%, mod LAE, tissue MVR ok, mod TR, mod pulmo HTN with RVSP 48 mmHg  . Hypertension   . Obstructive sleep apnea   . Paroxysmal atrial fibrillation (HCC)    s/p Maze procedure at time of MVR in 2011  . Pulmonary HTN ([place])   . 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 AV FISTULA PLACEMENT Left 07/13/2017   Procedure: LEFT ARM ARTERIOVENOUS (AV) FISTULA CREATION;  Surgeon: Waynetta Sandy, MD;  Location: [facility];  Service: Vascular;  Laterality: Left;  . CARDIAC SURGERY    . CARDIAC VALVE REPLACEMENT  2011  . CARDIOVERSION  N/A 07/09/2017   Procedure: CARDIOVERSION;  Surgeon: Larey Dresser, MD;  Location: [facility] ENDOSCOPY;  Service: Cardiovascular;  Laterality: N/A;  . COLONOSCOPY N/A 07/16/2017   Procedure: COLONOSCOPY;  Surgeon: Jerene Bears, MD;  Location: [place];  Service: Gastroenterology;  Laterality: N/A;  . ESOPHAGOGASTRODUODENOSCOPY N/A 07/16/2017   Procedure: ESOPHAGOGASTRODUODENOSCOPY (EGD);  Surgeon: Jerene Bears, MD;  Location: [facility] ENDOSCOPY;  Service: Gastroenterology;  Laterality: N/A;  . INSERTION OF DIALYSIS CATHETER Right 07/13/2017   Procedure: INSERTION OF DIALYSIS CATHETER RIGHT INTERNAL JUGULAR;  Surgeon: Waynetta Sandy, MD;  Location: [place];  Service: Vascular;  Laterality:  Right;  . RIGHT HEART CATH N/A 06/29/2017   Procedure: RIGHT HEART CATH;  Surgeon: Larey Dresser, MD;  Location: [place] CV LAB;  Service: Cardiovascular;  Laterality: N/A;  . TEE WITHOUT CARDIOVERSION N/A 07/09/2017   Procedure: TRANSESOPHAGEAL ECHOCARDIOGRAM (TEE);  Surgeon: Larey Dresser, MD;  Location: [facility] ENDOSCOPY;  Service: Cardiovascular;  Laterality: N/A;     Home Medications:  Prior to Admission medications   Medication Sig Start Date End Date Taking? Authorizing Provider  ACCU-CHEK SOFTCLIX LANCETS lancets USE AS INSTRUCTED 3 TIMES DAILY 08/04/17  Yes Michael Morale, MD  acetaminophen-codeine (TYLENOL #3) 300-30 MG tablet Take 1 tablet by mouth every 12 (twelve) hours as needed for moderate pain. 08/04/17  Yes Michael Morale, MD  allopurinol (ZYLOPRIM) 300 MG tablet Take 0.5 tablets (150 mg total) by mouth daily. 07/28/17  Yes Shirley Friar, PA-C  atorvastatin (LIPITOR) 20 MG tablet Take 1 tablet (20 mg total) by mouth daily. 07/28/17  Yes Shirley Friar, PA-C  Blood Glucose Monitoring Suppl (ACCU-CHEK AVIVA PLUS) w/Device KIT USE AS INSTRUCTED 3 TIMES DAILY 08/04/17  Yes Michael Morale, MD  calcium acetate (PHOSLO) 667 MG capsule Take 1 capsule (667 mg total) by mouth 3 (three) times daily with meals. 07/27/17  Yes Shirley Friar, PA-C  diclofenac sodium (VOLTAREN) 1 % GEL Apply 4 g topically 4 (four) times daily.   Yes [provider]  docusate sodium (COLACE) 100 MG capsule Take 1 capsule (100 mg total) by mouth 2 (two) times daily. 07/27/17  Yes Shirley Friar, PA-C  glucose blood (ACCU-CHEK AVIVA PLUS) test strip USE 3 TIMES DAILY AS DIRECTED. 08/04/17  Yes Michael Morale, MD  metoprolol succinate (TOPROL-XL) 25 MG 24 hr tablet Take 1 tablet (25 mg total) by mouth 2 (two) times daily. 07/27/17  Yes Shirley Friar, PA-C  midodrine (PROAMATINE) 5 MG tablet Take 1 tablet (5 mg total) by mouth 3 (three) times daily with meals. 07/27/17   Yes Shirley Friar, PA-C  omeprazole (PRILOSEC) 20 MG capsule Take 1 capsule (20 mg total) by mouth daily. 03/03/17  Yes Michael Morale, MD  OXYGEN Inhale 2 L into the lungs daily.   Yes [provider]  tiZANidine (ZANAFLEX) 4 MG tablet Take 1 tablet (4 mg total) by mouth every 8 (eight) hours as needed for muscle spasms. 08/04/17  Yes Michael Morale, MD  warfarin (COUMADIN) 2.5 MG tablet Take 1 tablet (2.5 mg total) by mouth daily at 6 PM. Or as directed by coumadin clinic 07/27/17  Yes Tillery, Satira Mccallum, PA-C  acetaminophen (TYLENOL) 650 MG CR tablet Take 650 mg by mouth every 6 (six) hours as needed for pain.    [provider]  colchicine 0.6 MG tablet Take 1.2 mg by mouth daily as needed for pain. 06/02/17   [provider]  Darbepoetin Alfa (ARANESP) 200 MCG/0.4ML  SOSY injection Inject 0.4 mLs (200 mcg total) into the skin every Saturday at 6 PM. 08/01/17   Tillery, Satira Mccallum, PA-C  folic acid (FOLVITE) 1 MG tablet TAKE 1 TABLET BY MOUTH DAILY. 04/20/17   Michael Morale, MD  hydrOXYzine (ATARAX/VISTARIL) 10 MG tablet Take 1 tablet (10 mg total) by mouth every 6 (six) hours as needed for itching or anxiety (if itching unrelieved by benadryl). 07/27/17   Shirley Friar, PA-C  Insulin Detemir (LEVEMIR FLEXTOUCH) 100 UNIT/ML Pen Inject 10 Units into the skin daily at 10 pm. 10/16/16   Michael Morale, MD    Inpatient Medications: Scheduled Meds:  Continuous Infusions: . [START ON 08/11/2017] ceFEPime (MAXIPIME) IV    . [START ON 08/12/2017] ferric gluconate (FERRLECIT/NULECIT) IV    . vancomycin 1,500 mg (08/10/17 1651)   PRN Meds:   Allergies:   No Known Allergies  Social History:   Social History   Social History  . Marital status: Single    Spouse name: N/A  . Number of children: N/A  . Years of education: N/A   Occupational History  .      on disability   Social History Main Topics  . Smoking status: Former Smoker    Packs/day:  1.00    Types: Cigarettes  . Smokeless tobacco: Former Systems developer     Comment: said he is not a regular smoker  . Alcohol use No  . Drug use: No  . Sexual activity: Not Currently   Other Topics Concern  . Not on file   Social History Narrative  . No narrative on file    Family History:    Family History  Problem Relation Age of Onset  . Heart attack Neg Hx      ROS:  Please see the history of present illness.  ROS  General:no colds + fevers, no weight changes Skin:no rashes or ulcers HEENT:no blurred vision, no congestion CV:see HPI PUL:see HPI GI:no diarrhea constipation or melena, no indigestion GU:no hematuria, no dysuria- no longer voids   MS:no joint pain, no claudication, Lt lower arm with swelling and warm Neuro:no syncope, no lightheadedness, + unresponsive today Endo:+ diabetes, no thyroid disease   Physical Exam/Data:   Vitals:   08/10/17 1600 08/10/17 1615 08/10/17 1630 08/10/17 1645  BP: '93/63 93/71 97/76 ' 97/69  Pulse: 79 86 82 81  Resp: '17 16 15 14  ' Temp:      TempSrc:      SpO2: 99% 98% 96% 95%   No intake or output data in the 24 hours ending 08/10/17 1658 There were no vitals filed for this visit. There is no height or weight on file to calculate BMI.  General:  well developed, in no acute distress currently but appears chronically ill HEENT: normal Lymph: no adenopathy Neck: no JVD Endocrine:  No thryomegaly Vascular: No carotid bruits; 2+ bil pedal pulses Cardiac:  normal S1, S2; RRR; 3/6 systolic murmur no gallup or rub Lungs:  clear to auscultation bilaterally, no wheezing, rhonchi or rales  Abd: soft, nontender, no hepatomegaly  Ext: no edema Musculoskeletal:  No deformities, BUE and BLE strength normal and equal Skin: warm and dry  Neuro:  Alert and oriented X 3 MAE follows commands, + facial symetry Psych:  Normal to flat affect     Relevant CV Studies: 07/09/17 TEE  Study Conclusions  - Left ventricle: The cavity size was  normal. Wall thickness was   normal. Systolic function was normal. The estimated ejection  fraction was in the range of 55% to 60%. Wall motion was normal;   there were no regional wall motion abnormalities. No evidence of   thrombus. - Aortic valve: There was no stenosis. There was moderate   regurgitation. - Aorta: Normal caliber aorta with grade III plaque in descending   thoracic aorta. - Mitral valve: Status post mitral valve replacement with   bioprosthetic valve. Trivial MR. Mean gradient 6 mmHg across   valve, does not appear to have significant stenosis. - Left atrium: The atrium was severely dilated. The LA appendage   appears to have been ligated. No LA thrombus. - Right ventricle: D-shaped interventricular septum suggestive of   RV pressure/volume overload. The RV was severely dilated with   moderately decreased systolic function. - Right atrium: The atrium was moderately dilated. - Atrial septum: No defect or patent foramen ovale was identified. - Tricuspid valve: S/p tricuspid valve repair with moderate to   severe TR. Peak RV-RA gradient 25 mmHg.  Impressions:  - Proceed to DCCV. Successful cardioversion. No cardiac source of   emboli was indentified.   RT HEART CATH 06/29/17 Procedures   RIGHT HEART CATH  Conclusion   1. Elevated right and left heart filling pressures, more prominent RV failure.  2. Pulmonary venous hypertension.  3. Cardiac output vigorous on milrinone.    Right Heart   Right Heart Pressures RHC Procedural Findings: Hemodynamics (mmHg) RA mean 27 RV 63/8, mean 20 PA 58/28, mean 40 PCWP mean 28  Oxygen saturations: PA 69% AO 96%  Cardiac Output (Fick) 9.82  Cardiac Index (Fick) 4.96 PVR 1.2 WU     TTE 06/01/17 Study Conclusions  - Left ventricle: The cavity size was normal. There was mild   concentric hypertrophy. Systolic function was normal. The   estimated ejection fraction was in the range of 50% to 55%. Wall    motion was normal; there were no regional wall motion   abnormalities. - Ventricular septum: The contour showed diastolic flattening. - Aortic valve: Transvalvular velocity was minimally increased.   There was no stenosis. There was mild regurgitation. Peak   velocity (S): 201 cm/s. - Mitral valve: A bioprosthesis was present and functioning   normally. Mean gradient (D): 6 mm Hg. Valve area by pressure   half-time: 1.93 cm^2. - Left atrium: The atrium was severely dilated. - Right ventricle: The cavity size was moderately dilated. Wall   thickness was normal. Systolic function was moderately reduced. - Right atrium: The atrium was moderately dilated. - Tricuspid valve: There was severe regurgitation. - Pulmonary arteries: Systolic pressure was moderately increased.   PA peak pressure: 54 mm Hg (S).  Laboratory Data:  Chemistry Recent Labs Lab 08/10/17 1400 08/10/17 1416  NA 133* 135  K 3.9 3.9  CL 97* 94*  CO2 26  --   GLUCOSE 93 89  BUN 15 18  CREATININE 3.53* 3.40*  CALCIUM 8.7*  --   GFRNONAA 18*  --   GFRAA 21*  --   ANIONGAP 10  --      Recent Labs Lab 08/10/17 1400  PROT 6.8  ALBUMIN 3.2*  AST 30  ALT 13*  ALKPHOS 142*  BILITOT 2.8*   Hematology Recent Labs Lab 08/10/17 1400 08/10/17 1416  WBC 7.6  --   RBC 3.79*  --   HGB 10.2* 11.6*  HCT 32.4* 34.0*  MCV 85.5  --   MCH 26.9  --   MCHC 31.5  --   RDW 19.1*  --  PLT 80*  --    Cardiac EnzymesNo results for input(s): TROPONINI in the last 168 hours.  Recent Labs Lab 08/10/17 1414  TROPIPOC 0.03    BNPNo results for input(s): BNP, PROBNP in the last 168 hours.  DDimer No results for input(s): DDIMER in the last 168 hours.  Radiology/Studies:  Ct Head Wo Contrast  Result Date: 08/10/2017 CLINICAL DATA:  Patient fell and struck head. EXAM: CT HEAD WITHOUT CONTRAST TECHNIQUE: Contiguous axial images were obtained from the base of the skull through the vertex without intravenous contrast.  COMPARISON:  None. FINDINGS: Brain: There is no evidence for acute hemorrhage, hydrocephalus, mass lesion, or abnormal extra-axial fluid collection. No definite CT evidence for acute infarction. Vascular: No hyperdense vessel or unexpected calcification. Skull: No evidence for fracture. No worrisome lytic or sclerotic lesion. Sinuses/Orbits: Visualized portions of the globes and intraorbital fat are unremarkable. The visualized paranasal sinuses and mastoid air cells are clear. Other: None. IMPRESSION: 1. No acute intracranial abnormality. Electronically Signed   By: Misty Stanley M.D.   On: 08/10/2017 16:04   Dg Chest Port 1 View  Result Date: 08/10/2017 CLINICAL DATA:  Patient unresponsive during dialysis. EXAM: PORTABLE CHEST 1 VIEW COMPARISON:  Chest radiograph 07/13/2017 FINDINGS: Central venous catheter tip projects over the superior vena cava. Stable cardiomegaly status post median sternotomy. Heterogeneous opacities right lung base. Small right pleural effusion. IMPRESSION: Small right pleural effusion with heterogeneous opacities right lung base which may represent infection or atelectasis. Electronically Signed   By: Lovey Newcomer M.D.   On: 08/10/2017 14:31    Assessment and Plan:   1. Possible post arrest with 2 min CPR currently alert and oriented X 3, will follow serial troponins, lexiscan myoview, and will repeat echo.  2. ESRD on HD through cath rt subclavian and  AV fistula Lt upper arm 3. Hx MVR with pericardial tissue valve and tricuspid repair in 2012 in [place] and last echo with severe TR. 4. Chronic diastolic Rt heart Failure not candidate for home milrinone with limited social support and dialysis  (was started with last admit but weaned off with dialysis)  5. Atrial fib now persistent to chronic with failure of DCCV in August and no plan to re-try per discharge summary.  Anticoagulated with coumadin and INR 1.9  On toprol XL 25 mg BID  6. Lt arm redness/swelling neg for DVT  07/25/17 lactic acid ELEVATED 7. Anemia hgb now 10.2  8.         Fever at home, afebrile here. IV abx started. Elevated lactic acid/ heterogeneous opacities right lung base which may represent infection or atelectasis. 8. Hypotensive on admit  On proamatine 5 mg TID as outpt.  9. HLD on statin continue 10. DM -2 per IM   For questions or updates, please contact [place] Please consult [email or web address] for contact info under Cardiology/STEMI.   Signed, Cecilie Kicks, NP  08/10/2017 4:58 PM

## 2017-08-11 ENCOUNTER — Inpatient Hospital Stay (HOSPITAL_COMMUNITY): Payer: Medicare Other

## 2017-08-11 ENCOUNTER — Ambulatory Visit: Payer: Medicare Other | Admitting: Physician Assistant

## 2017-08-11 DIAGNOSIS — I4891 Unspecified atrial fibrillation: Secondary | ICD-10-CM

## 2017-08-11 DIAGNOSIS — Z87891 Personal history of nicotine dependence: Secondary | ICD-10-CM

## 2017-08-11 DIAGNOSIS — S7011XA Contusion of right thigh, initial encounter: Secondary | ICD-10-CM

## 2017-08-11 DIAGNOSIS — I4510 Unspecified right bundle-branch block: Secondary | ICD-10-CM

## 2017-08-11 DIAGNOSIS — I480 Paroxysmal atrial fibrillation: Secondary | ICD-10-CM

## 2017-08-11 DIAGNOSIS — I5032 Chronic diastolic (congestive) heart failure: Secondary | ICD-10-CM

## 2017-08-11 DIAGNOSIS — I951 Orthostatic hypotension: Secondary | ICD-10-CM

## 2017-08-11 DIAGNOSIS — G8911 Acute pain due to trauma: Secondary | ICD-10-CM

## 2017-08-11 DIAGNOSIS — X58XXXA Exposure to other specified factors, initial encounter: Secondary | ICD-10-CM

## 2017-08-11 LAB — CBC
HCT: 32.6 % — ABNORMAL LOW (ref 39.0–52.0)
HCT: 35.5 % — ABNORMAL LOW (ref 39.0–52.0)
HEMOGLOBIN: 11.2 g/dL — AB (ref 13.0–17.0)
Hemoglobin: 9.9 g/dL — ABNORMAL LOW (ref 13.0–17.0)
MCH: 26.2 pg (ref 26.0–34.0)
MCH: 27.1 pg (ref 26.0–34.0)
MCHC: 30.4 g/dL (ref 30.0–36.0)
MCHC: 31.5 g/dL (ref 30.0–36.0)
MCV: 86.2 fL (ref 78.0–100.0)
MCV: 85.7 fL (ref 78.0–100.0)
PLATELETS: 95 10*3/uL — AB (ref 150–400)
PLATELETS: 99 10*3/uL — AB (ref 150–400)
RBC: 3.78 MIL/uL — ABNORMAL LOW (ref 4.22–5.81)
RBC: 4.14 MIL/uL — ABNORMAL LOW (ref 4.22–5.81)
RDW: 19.3 % — AB (ref 11.5–15.5)
RDW: 19.1 % — AB (ref 11.5–15.5)
WBC: 5.5 10*3/uL (ref 4.0–10.5)
WBC: 5.5 10*3/uL (ref 4.0–10.5)

## 2017-08-11 LAB — RENAL FUNCTION PANEL
ALBUMIN: 3 g/dL — AB (ref 3.5–5.0)
ANION GAP: 8 (ref 5–15)
BUN: 21 mg/dL — AB (ref 6–20)
CALCIUM: 8.6 mg/dL — AB (ref 8.9–10.3)
CO2: 27 mmol/L (ref 22–32)
CREATININE: 3.9 mg/dL — AB (ref 0.61–1.24)
Chloride: 98 mmol/L — ABNORMAL LOW (ref 101–111)
GFR calc Af Amer: 18 mL/min — ABNORMAL LOW (ref >60)
GFR calc non Af Amer: 16 mL/min — ABNORMAL LOW (ref >60)
GLUCOSE: 68 mg/dL (ref 65–99)
PHOSPHORUS: 3.9 mg/dL (ref 2.5–4.6)
Potassium: 4.1 mmol/L (ref 3.5–5.1)
SODIUM: 133 mmol/L — AB (ref 135–145)

## 2017-08-11 LAB — BASIC METABOLIC PANEL
Anion gap: 9 (ref 5–15)
BUN: 20 mg/dL (ref 6–20)
CO2: 27 mmol/L (ref 22–32)
Calcium: 8.4 mg/dL — ABNORMAL LOW (ref 8.9–10.3)
Chloride: 99 mmol/L — ABNORMAL LOW (ref 101–111)
Creatinine, Ser: 3.98 mg/dL — ABNORMAL HIGH (ref 0.61–1.24)
GFR, EST AFRICAN AMERICAN: 18 mL/min — AB (ref >60)
GFR, EST NON AFRICAN AMERICAN: 15 mL/min — AB (ref >60)
Glucose, Bld: 97 mg/dL (ref 65–99)
POTASSIUM: 4 mmol/L (ref 3.5–5.1)
SODIUM: 135 mmol/L (ref 135–145)

## 2017-08-11 LAB — GLUCOSE, CAPILLARY
GLUCOSE-CAPILLARY: 85 mg/dL (ref 65–99)
GLUCOSE-CAPILLARY: 81 mg/dL (ref 65–99)
GLUCOSE-CAPILLARY: 129 mg/dL — AB (ref 65–99)

## 2017-08-11 LAB — TROPONIN I
TROPONIN I: 0.03 ng/mL — AB (ref <0.03)
TROPONIN I: 0.04 ng/mL — AB (ref <0.03)

## 2017-08-11 LAB — PROTIME-INR
INR: 2.1
Prothrombin Time: 23.4 seconds — ABNORMAL HIGH (ref 11.4–15.2)

## 2017-08-11 MED ORDER — PENTAFLUOROPROP-TETRAFLUOROETH EX AERO: 1.0000 "application " | INHALATION_SPRAY | CUTANEOUS | Status: DC | PRN

## 2017-08-11 MED ORDER — SENNOSIDES-DOCUSATE SODIUM 8.6-50 MG PO TABS: 1.0000 | ORAL_TABLET | Freq: Every evening | ORAL | Status: DC | PRN

## 2017-08-11 MED ORDER — MIDODRINE HCL 5 MG PO TABS
10.0000 mg | ORAL_TABLET | Freq: Three times a day (TID) | ORAL | Status: DC
  Administered 2017-08-11 – 2017-08-26 (×42): 10 mg via ORAL
  Filled 2017-08-11 (×39): qty 2

## 2017-08-11 MED ORDER — LIDOCAINE-PRILOCAINE 2.5-2.5 % EX CREA
1.0000 "application " | TOPICAL_CREAM | CUTANEOUS | Status: DC | PRN
  Filled 2017-08-11: qty 5

## 2017-08-11 MED ORDER — ALTEPLASE 2 MG IJ SOLR: 2.0000 mg | Freq: Once | INTRAMUSCULAR | Status: DC | PRN

## 2017-08-11 MED ORDER — SODIUM CHLORIDE 0.9 % IV SOLN: 100.0000 mL | INTRAVENOUS | Status: DC | PRN

## 2017-08-11 MED ORDER — WARFARIN SODIUM 2.5 MG PO TABS
2.5000 mg | ORAL_TABLET | Freq: Once | ORAL | Status: AC
  Administered 2017-08-11: 2.5 mg via ORAL
  Filled 2017-08-11: qty 1

## 2017-08-11 MED ORDER — MORPHINE SULFATE (PF) 4 MG/ML IV SOLN
INTRAVENOUS | Status: AC
  Filled 2017-08-11: qty 1

## 2017-08-11 MED ORDER — HEPARIN SODIUM (PORCINE) 1000 UNIT/ML DIALYSIS
1000.0000 [IU] | INTRAMUSCULAR | Status: DC | PRN
  Filled 2017-08-11: qty 1

## 2017-08-11 MED ORDER — REGADENOSON 0.4 MG/5ML IV SOLN
INTRAVENOUS | Status: AC
  Filled 2017-08-11: qty 5

## 2017-08-11 MED ORDER — MORPHINE SULFATE (PF) 2 MG/ML IV SOLN
1.0000 mg | Freq: Once | INTRAVENOUS | Status: AC
  Administered 2017-08-11: 1 mg via INTRAVENOUS

## 2017-08-11 MED ORDER — HEPARIN SODIUM (PORCINE) 1000 UNIT/ML DIALYSIS
20.0000 [IU]/kg | INTRAMUSCULAR | Status: DC | PRN
  Filled 2017-08-11: qty 2

## 2017-08-11 MED ORDER — LIDOCAINE HCL (PF) 1 % IJ SOLN: 5.0000 mL | INTRAMUSCULAR | Status: DC | PRN

## 2017-08-11 MED ORDER — TECHNETIUM TC 99M TETROFOSMIN IV KIT
10.0000 | PACK | Freq: Once | INTRAVENOUS | Status: AC | PRN
Start: 2017-08-11 — End: 2017-08-11
  Administered 2017-08-11: 10 via INTRAVENOUS

## 2017-08-11 MED ORDER — LIDOCAINE 5 % EX PTCH
1.0000 | MEDICATED_PATCH | CUTANEOUS | Status: DC
  Administered 2017-08-11 – 2017-08-17 (×6): 1 via TRANSDERMAL
  Filled 2017-08-11 (×6): qty 1

## 2017-08-11 NOTE — Progress Notes (Signed)
   Was called down to nuclear medicine to perform nuclear stress test on pt. Pt was noted to be hypotensive with SBPs in the 80s. Pt was asymptomatic. BP was monitored and repeated several times and remained low. Hypotension is a contraindication for Lexiscan given vasodilation and risk of worsening hypotension. We will postpone stress portion of test today. Resting images were completed earlier. Will notify primary team. Pt is a dialysis patient. May need to get midodrine prior to stress tomorrow.   [place] Placeres [place]

## 2017-08-11 NOTE — Progress Notes (Signed)
   VASCULAR SURGERY ASSESSMENT & PLAN:   This patient had placement of a right IJ tunneled dialysis catheter and a left arm first stage basilic vein fistula created by Dr. Donzetta Matters on 07/13/2017. We were consult because the fistula was clotted. He was also having some pain in the arm.  On my exam, the fistula has an excellent thrill. He has a palpable radial pulse and the incision is healing adequately. He should continue his regularly scheduled follow up with Dr. Donzetta Matters.  SUBJECTIVE:   Currently no specific complaints.   PHYSICAL EXAM:   Vitals:   08/11/17 1253 08/11/17 1304 08/11/17 1309 08/11/17 1402  BP: (!) 88/70 100/69 (!) 84/61 108/78  Pulse: 89 91 94 86  Resp:    18  Temp:    97.7 F (36.5 C)  TempSrc:    Oral  SpO2:    100%  Weight:      Height:       Excellent thrill in left first stage basilic vein transposition   Palpable left radial pulse.  LABS:   Lab Results  Component Value Date   WBC 5.5 08/11/2017   HGB 9.9 (L) 08/11/2017   HCT 32.6 (L) 08/11/2017   MCV 86.2 08/11/2017   PLT 95 (L) 08/11/2017   Lab Results  Component Value Date   CREATININE 3.98 (H) 08/11/2017   Lab Results  Component Value Date   INR 2.10 08/11/2017   CBG (last 3)   Recent Labs  08/10/17 2118 08/10/17 2149 08/11/17 0743  GLUCAP 65 109* 85    PROBLEM LIST:    Active Problems:   DM type 2 (diabetes mellitus, type 2) (HCC)   Permanent atrial fibrillation (HCC)   ESRD (end stage renal disease) on dialysis (HCC)   Syncope and collapse   CURRENT MEDS:   . allopurinol  150 mg Oral Daily  . atorvastatin  20 mg Oral Daily  . calcium acetate  667 mg Oral TID WC  . diclofenac sodium  4 g Topical QID  . insulin aspart  0-5 Units Subcutaneous QHS  . insulin aspart  0-9 Units Subcutaneous TID WC  . lidocaine  1 patch Transdermal Q24H  . metoprolol succinate  25 mg Oral BID  . midodrine  10 mg Oral TID WC  . morphine      . pantoprazole  40 mg Oral Daily  . warfarin  2.5 mg  Oral ONCE-1800  . Warfarin - Pharmacist Dosing Inpatient   Does not apply [place]: [phone] Office: [phone] 08/11/2017

## 2017-08-11 NOTE — Progress Notes (Signed)
Subjective: Patient feels okay this morning. Continues to have post CPR chest pain. He state tha the had RLE pain that is improved as well. RUE pain improved and swelling stable. His daughter called while we were in the room and his status was explained to her with patients permission.   Objective:  Vital signs in last 24 hours: Vitals:   08/10/17 2049 08/11/17 0006 08/11/17 0443 08/11/17 0745  BP:  96/67 101/75 97/73  Pulse:  76 82 77  Resp:  (!) 21 15 18   Temp:   97.6 F (36.4 C) (!) 97.5 F (36.4 C)  TempSrc:   Oral Oral  SpO2:  100% 100% 100%  Weight:   167 lb 14.4 oz (76.2 kg)   Height: 5\' 5"  (1.651 m)      Physical Exam  Constitutional: He is oriented to person, place, and time. He appears well-developed and well-nourished.  HENT:  Head: Normocephalic and atraumatic.  Eyes: EOM are normal. Right eye exhibits no discharge. Left eye exhibits no discharge.  Cardiovascular: Normal rate.   Irregularly irregular rhythm  Pulmonary/Chest: Effort normal and breath sounds normal. No respiratory distress. He has no wheezes.  Abdominal: Soft. Bowel sounds are normal. He exhibits no distension. There is no tenderness.  Musculoskeletal:  Edema and erythema of LUE No LE edema  Neurological: He is alert and oriented to person, place, and time.  Skin: Skin is warm and dry.  Erythema and warmth at L forearm    Assessment/Plan:  Mr. Rafalski is a 56 yo M with a history of DM, ESRD on HD MWFSat, HTN, Chronic A. Fib, HFpEF, GERD, S/P Mitral Valve replacement and Tricuspid valve repair, recently admitted from 8/9 - 9/10 for Acute on Chronic HF and AKI who presents following an episode of syncope and collapse at dialysis  Syncope and Collapse: Patient present following loss of consciousness during dialysis. Bystanders suspected sudden cardiac arrest as he was reportedly apneic and pulseless. CPR was performed and patient spontaneously regained consciousness within minutes. Differential  includes sudden cardiac arrest, seizure (patient was seen with his body tensed before loosing consciousness and bit his tongue, but without history of seizure or reported convulsions); and pulmonary embolism [at risk with potential LUE DVT (as below), Well's 4.5, 16.2% risk, [place] 9pts, 20-30% incidence]. CT angio cancelled due to recent initiation of HD. V/Q ordered initially, but cancelled due to conflict with Myoview study (time needed between nuclear medicine studies). Troponin trend 0.05, 0.04, 0.03. Repeat EKG, unchanged to prior. - Appreciate cardiology recommendations  - Cardiac monitoring - Echocardiogram - Myoview - Voltaren gel - Norco 5mg , 1-2 q4h PRN  Left Upper extremity Pain: Patient complained of 2 days of increasing left forearm swelling and pain. Patient had left antecubital fistula placed on 8/27. Fistula is without thrill and possibly clotted. Questionable cellulitis.  Excellent thrill with adequate healing on exam by vascular surgery. We appreciate their recommendations.  Code Sepsis: Patient presented with hypotension and elevated Lactate 2.64. CXR showed heterogeneous opacities at right lung base. Questionable Cellulitis at LUE. Vancomycin and cefepime started in the ED. Afebrile, WBC 7.6. Will consider discontinuing antibiotics as workup continues. Blood Culture NGTD. - Vancomycin (per pharmacy) - Cefepime 1g q24h - CBC in AM  DM: On 10 units Levemir qhs at home, currently has run out.  - SSI  ESRD on HD MWFS:  - Nephrology on board - Phoslo 667mg  TID AC - Nulecit 125mg  in NS MWF (HD) - Midodrine 5mg  TID AC, for hypotension -  Renal function panel in AM  Chronic A. Fib: Rate controlled. On Warfarin, Subtherapeutic on admission. - Metoprolol succinate 25mg  BID - Warfarin (per pharmacy)   FEN: NPO DVT Prophylaxis: Warfarin Code Status: Full Code  Dispo: Anticipated discharge in approximately 1-3 day(s).   Neva Seat, MD 08/11/2017, 11:17  AM Pager: [phone]

## 2017-08-11 NOTE — Progress Notes (Signed)
Test cancelled d/t low BP

## 2017-08-11 NOTE — Progress Notes (Addendum)
Received call from CT tech stating pt will not be able to have STAT CT angio with contrast d/t being a fairly new hemodialysis pt. Provider notified; VQ Scan order obtained instead.  Jacqlyn Larsen, RN

## 2017-08-11 NOTE — Consult Note (Signed)
   [facility] CM Inpatient Consult   08/11/2017  Michael [name] 04/28/1961 [id number]   Patient is currently active with [facility] for chronic disease management services.  Patient has been engaged by a [employer] and CSW.  Our community based plan of care has focused on disease management and community resource support. Came by to see patient but he was not in the room.  No family in the room.   Patient will receive a post discharge transition of care call and will be evaluated for monthly home visits for assessments and disease process education. Consent on file.  Made Inpatient Case Manager, Hassan Rowan aware that [facility] following. Will continue to follow for needs and progression of disposition needs.   Of note, [facility] Care Management services does not replace or interfere with any services that are needed or arranged by inpatient case management or social work.  For additional questions or referrals please contact:  Natividad Brood, RN BSN [facility] Liaison  [phone] business mobile phone Toll free office [phone]

## 2017-08-11 NOTE — Progress Notes (Addendum)
Progress Note  Patient Name: Michael [name] Date of Encounter: 08/11/2017  Primary Cardiologist: Dr. Harrington Challenger  Subjective   Pt complains of sore chest today. Tender to palpation. He denies any other discomfort. He admits to chronic shortness of breath "at times", none at present.   Inpatient Medications    Scheduled Meds: . allopurinol  150 mg Oral Daily  . atorvastatin  20 mg Oral Daily  . calcium acetate  667 mg Oral TID WC  . diclofenac sodium  4 g Topical QID  . insulin aspart  0-5 Units Subcutaneous QHS  . insulin aspart  0-9 Units Subcutaneous TID WC  . metoprolol succinate  25 mg Oral BID  . midodrine  5 mg Oral TID WC  . pantoprazole  40 mg Oral Daily  . Warfarin - Pharmacist Dosing Inpatient   Does not apply q1800   Continuous Infusions: . ceFEPime (MAXIPIME) IV    . [START ON 08/12/2017] ferric gluconate (FERRLECIT/NULECIT) IV     PRN Meds: HYDROcodone-acetaminophen, senna-docusate   Vital Signs    Vitals:   08/10/17 2049 08/11/17 0006 08/11/17 0443 08/11/17 0745  BP:  96/67 101/75 97/73  Pulse:  76 82 77  Resp:  (!) 21 15 18   Temp:   97.6 F (36.4 C) (!) 97.5 F (36.4 C)  TempSrc:   Oral Oral  SpO2:  100% 100% 100%  Weight:   167 lb 14.4 oz (76.2 kg)   Height: 5\' 5"  (1.651 m)       Intake/Output Summary (Last 24 hours) at 08/11/17 0802 Last data filed at 08/10/17 2023  Gross per 24 hour  Intake                0 ml  Output                0 ml  Net                0 ml   Filed Weights   08/11/17 0443  Weight: 167 lb 14.4 oz (76.2 kg)    Telemetry    Atrial fibrillation in the 70's-80's - Personally Reviewed  ECG    Atrial fibrillation at 79 bpm with RBBB, LAD, nonspecific T wave changes laterally- Personally Reviewed  Physical Exam   GEN: No acute distress.   Neck: No JVD Cardiac: irregularly irregular rhythm, 3/6 systolic murmur  Respiratory: Clear to auscultation bilaterally. GI: Soft, nontender, non-distended  MS: No edema; No  deformity. Neuro:  Nonfocal  Psych: Normal affect  Redness and swelling of the left wrist area  Labs    Chemistry Recent Labs Lab 08/10/17 1400 08/10/17 1416  NA 133* 135  K 3.9 3.9  CL 97* 94*  CO2 26  --   GLUCOSE 93 89  BUN 15 18  CREATININE 3.53* 3.40*  CALCIUM 8.7*  --   PROT 6.8  --   ALBUMIN 3.2*  --   AST 30  --   ALT 13*  --   ALKPHOS 142*  --   BILITOT 2.8*  --   GFRNONAA 18*  --   GFRAA 21*  --   ANIONGAP 10  --      Hematology Recent Labs Lab 08/10/17 1400 08/10/17 1416 08/11/17 0642  WBC 7.6  --  5.5  RBC 3.79*  --  3.78*  HGB 10.2* 11.6* 9.9*  HCT 32.4* 34.0* 32.6*  MCV 85.5  --  86.2  MCH 26.9  --  26.2  MCHC 31.5  --  30.4  RDW 19.1*  --  19.3*  PLT 80*  --  95*    Cardiac Enzymes Recent Labs Lab 08/10/17 1812 08/11/17 0004  TROPONINI 0.05* 0.04*    Recent Labs Lab 08/10/17 1414  TROPIPOC 0.03     BNPNo results for input(s): BNP, PROBNP in the last 168 hours.   DDimer No results for input(s): DDIMER in the last 168 hours.   Radiology    Ct Head Wo Contrast  Result Date: 08/10/2017 CLINICAL DATA:  Patient fell and struck head. EXAM: CT HEAD WITHOUT CONTRAST TECHNIQUE: Contiguous axial images were obtained from the base of the skull through the vertex without intravenous contrast. COMPARISON:  None. FINDINGS: Brain: There is no evidence for acute hemorrhage, hydrocephalus, mass lesion, or abnormal extra-axial fluid collection. No definite CT evidence for acute infarction. Vascular: No hyperdense vessel or unexpected calcification. Skull: No evidence for fracture. No worrisome lytic or sclerotic lesion. Sinuses/Orbits: Visualized portions of the globes and intraorbital fat are unremarkable. The visualized paranasal sinuses and mastoid air cells are clear. Other: None. IMPRESSION: 1. No acute intracranial abnormality. Electronically Signed   By: Misty Stanley M.D.   On: 08/10/2017 16:04   Dg Chest Port 1 View  Result Date:  08/10/2017 CLINICAL DATA:  Patient unresponsive during dialysis. EXAM: PORTABLE CHEST 1 VIEW COMPARISON:  Chest radiograph 07/13/2017 FINDINGS: Central venous catheter tip projects over the superior vena cava. Stable cardiomegaly status post median sternotomy. Heterogeneous opacities right lung base. Small right pleural effusion. IMPRESSION: Small right pleural effusion with heterogeneous opacities right lung base which may represent infection or atelectasis. Electronically Signed   By: Lovey Newcomer M.D.   On: 08/10/2017 14:31    Cardiac Studies   07/09/17 TEE  Study Conclusions - Left ventricle: The cavity size was normal. Wall thickness was normal. Systolic function was normal. The estimated ejection fraction was in the range of 55% to 60%. Wall motion was normal; there were no regional wall motion abnormalities. No evidence of thrombus. - Aortic valve: There was no stenosis. There was moderate regurgitation. - Aorta: Normal caliber aorta with grade III plaque in descending thoracic aorta. - Mitral valve: Status post mitral valve replacement with bioprosthetic valve. Trivial MR. Mean gradient 6 mmHg across valve, does not appear to have significant stenosis. - Left atrium: The atrium was severely dilated. The LA appendage appears to have been ligated. No LA thrombus. - Right ventricle: D-shaped interventricular septum suggestive of RV pressure/volume overload. The RV was severely dilated with moderately decreased systolic function. - Right atrium: The atrium was moderately dilated. - Atrial septum: No defect or patent foramen ovale was identified. 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 Peak RV-RA gradient 25 mmHg.  Impressions: - Proceed to DCCV. Successful cardioversion. No cardiac source of emboli was indentified.   RT HEART CATH 06/29/17   Conclusion  1. Elevated right and left heart filling pressures, more  prominent RV failure.  2. Pulmonary venous hypertension.  3. Cardiac output vigorous on milrinone.    Right Heart  Right Heart Pressures RHC Procedural Findings: Hemodynamics (mmHg) RA mean 27 RV 63/8, mean 20 PA 58/28, mean 40 PCWP mean 28  Oxygen saturations: PA 69% AO 96%  Cardiac Output (Fick) 9.82  Cardiac Index (Fick) 4.96 PVR 1.2 WU     TTE 06/01/17 Study Conclusions - Left ventricle: The cavity size was normal. There was mild concentric hypertrophy. Systolic function was normal. The estimated ejection fraction was in the  range of 50% to 55%. Wall motion was normal; there were no regional wall motion abnormalities. - Ventricular septum: The contour showed diastolic flattening. - Aortic valve: Transvalvular velocity was minimally increased. There was no stenosis. There was mild regurgitation. Peak velocity (S): 201 cm/s. - Mitral valve: A bioprosthesis was present and functioning normally. Mean gradient (D): 6 mm Hg. Valve area by pressure half-time: 1.93 cm^2. - Left atrium: The atrium was severely dilated. - Right ventricle: The cavity size was moderately dilated. Wall thickness was normal. Systolic function was moderately reduced. - Right atrium: The atrium was moderately dilated. - Tricuspid valve: There was severe regurgitation. - Pulmonary arteries: Systolic pressure was moderately increased. PA peak pressure: 54 mm Hg (S).   Patient Profile     56 y.o. male with a hx of MV disorder, with MVR with bioprosthetic MV, 2012 at [facility] in [place], also tricuspid valve repair., CAD, PAF, ESRD on HD since recent admit earlier this month, OSA, DM, HTN, HLD, CHF, pulmonary HTN, anemia who had cardiac arrest vs seizure while getting dialysis.  Assessment & Plan    Possible cardiac arrest while on dialysis with 2 minutes of CPR on 9/24: It is unclear what exactly happened. He had no chest pain prior to the episode and AED advised no shock-unsure  of what his rhythm was. Has chest tenderness now related to CPR. Troponins very mildly elevated at 0.05.  Echocardiogram is pending. Elevated D-Dimer of 0.99 IM has ordered a VQ scan. Will evaluate echo for valve function, LV function and wall motion. Plan for Lexiscan stress test. Cannot be done until Thursday since VQ scan is being done today. Could do as outpatient if he is ready to go home sooner.   ESRD: Followed by nephrology  Hx MVR with pericardial tissue valve and tricuspid repair in 2012. Last echo showed severe TR. Murmur is present. Will check echo today to evaluate valves.  Chronic diastolic right heart failure: Not a candidate for home milrinone and limited social support and dialysis.   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 Rate controlled on Toprol-XL 25 mg twice a day.  Fever at home, afebrile here. IV antibiotics started. Chest x-ray showed heterogeneous opacities at the right lung base. Patient has a red swollen area on his left wrist, questionable cellulitis. Internal medicine is managing.  Hypotension: blood pressures are soft but stable. Patient is on Midodrine 5 mg 3 times a day, er home regimen  Hyperlipidemia: continue statin  Diabetes type 2: Management per internal medicine   For questions or updates, please contact [place] Please consult [email or web address] for contact info under Cardiology/STEMI.      Signed, Daune Perch, NP  08/11/2017, 8:02 AM     Attending Addendum:  History and all data above reviewed.  Patient examined.  I agree with the findings as above.  All available labs, radiology testing, previous records reviewed. Agree with documented assessment and plan. Mr. 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  It seems as though it was a PEA arrest.  He is currently asymptomatic other than chest pain from CPR.  Echo is pending.  D-dimer was elevated so he is going for  V/Q scan today.  Cannot do another nuclear scan for at least 48 hours, so Lexiscan Myoview will have to wait until 9/27.  No arrhythmias noted on telemetry.  We will order lidocaine patches for his chest pain.  L UE looks better today after receiving IV antibiotics.  BP remains low.  Per Nephrology, may increase midodrine, especially if needed for dialysis.  Kristl Morioka C. Oval Linsey, MD, [facility]  08/11/2017 10:15 AM

## 2017-08-11 NOTE — Progress Notes (Signed)
ANTICOAGULATION CONSULT NOTE  Pharmacy Consult for Coumadin Indication: atrial fibrillation  No Known Allergies  Vital Signs: Temp: 97.5 F (36.4 C) (09/25 0745) Temp Source: Oral (09/25 0745) BP: 97/73 (09/25 0745) Pulse Rate: 77 (09/25 0745)  Labs:  Recent Labs  08/10/17 1400 08/10/17 1416 08/10/17 1523 08/10/17 1812 08/11/17 0004 08/11/17 0642  HGB 10.2* 11.6*  --   --   --  9.9*  HCT 32.4* 34.0*  --   --   --  32.6*  PLT 80*  --   --   --   --  95*  LABPROT  --   --  20.7*  --   --  23.4*  INR  --   --  1.79  --   --  2.10  CREATININE 3.53* 3.40*  --   --   --  3.98*  TROPONINI  --   --   --  0.05* 0.04* 0.03*    Estimated Creatinine Clearance: 19.5 mL/min (A) (by C-G formula based on SCr of 3.98 mg/dL (H)).   Medical History: Past Medical History:  Diagnosis Date  . Anemia of chronic disease   . Asthma    said he does not know  . Chronic kidney disease (CKD), stage IV (severe) ([place])   . Chronic systolic CHF (congestive heart failure) (HCC)    a. prior EF normal; b. Echo 10/16: Mild LVH, EF 30-35%, mitral valve bioprosthesis present without evidence of stenosis or regurgitation, severe LAE, mild RVE with mild to moderately reduced RVSF, moderate RAE  . Coronary artery disease   . Diabetes mellitus type 2 in obese ([place])   . Diabetes mellitus without complication ([place])   . Dyspnea   . GERD (gastroesophageal reflux disease)   . H/O tricuspid valve repair 2012  . History of echocardiogram    Echo at [facility] in [facility] 4/12: mod LVH, EF 60-65%, mod LAE, tissue MVR ok, mod TR, mod pulmo HTN with RVSP 48 mmHg  . Hypertension   . Obstructive sleep apnea   . Paroxysmal atrial fibrillation (HCC)    s/p Maze procedure at time of MVR in 2011  . Pulmonary HTN ([place])   . S/P mitral valve replacement with porcine valve 2012    Assessment: 56yom with suspected cardiac arrest on HD center on coumadin pta for afib/bioprosthetic MVR. INR on admit subtherapeutic at 1.79.  Pharmacy consulted to dose coumadin.   -INR= 2.1 today  Home dose: 2.5mg  daily - last taken 9/23  Goal of Therapy:  INR 2-3 Monitor platelets by anticoagulation protocol: Yes   Plan:  -Coumadin 2.5mg  po today -Daily PT/INR  Hildred Laser, Pharm D 08/11/2017 11:04 AM

## 2017-08-11 NOTE — Progress Notes (Signed)
Pt is in nuclear medicine at this time for lexiscan stress test. Pt complains of 10/10 chest pain. Patient states that pain is heavy and crushing. Pt states that this is the same pain the he presented to the hospital with prior to PEA arrest. Notified floor rn. Notified Dr Oval Linsey, she ordered morphine 1mg  IV once, Morphine administered. Dr. Oval Linsey wishes to proceed with lexiscan stress test depsite pt chest pain.

## 2017-08-11 NOTE — Progress Notes (Signed)
Rosita Fire, PA at bedside to assess pt

## 2017-08-11 NOTE — Progress Notes (Signed)
Patient ID: Michael [name], male   DOB: Sep 17, 1961, 56 y.o.   MRN: [id number]  [facility] Progress Note    Subjective:   Still complaining of chest pain   Objective:   BP 97/73 (BP Location: Right Arm)   Pulse 77   Temp (!) 97.5 F (36.4 C) (Oral)   Resp 18   Ht 5\' 5"  (1.651 m)   Wt 76.2 kg (167 lb 14.4 oz)   SpO2 100%   BMI 27.94 kg/m   Intake/Output: No intake/output data recorded.   Intake/Output this shift:  No intake/output data recorded. Weight change:   Physical Exam: Gen:WD NAD CVS:no rub Resp:cta PYP:PJKDTO Ext: trace pretibial edema, LUE AVF +T/B, +edema and erythema of left forearm  Labs: BMET  Recent Labs Lab 08/10/17 1400 08/10/17 1416 08/11/17 0642  NA 133* 135 135  K 3.9 3.9 4.0  CL 97* 94* 99*  CO2 26  --  27  GLUCOSE 93 89 97  BUN 15 18 20   CREATININE 3.53* 3.40* 3.98*  ALBUMIN 3.2*  --   --   CALCIUM 8.7*  --  8.4*   CBC  Recent Labs Lab 08/10/17 1400 08/10/17 1416 08/11/17 0642  WBC 7.6  --  5.5  HGB 10.2* 11.6* 9.9*  HCT 32.4* 34.0* 32.6*  MCV 85.5  --  86.2  PLT 80*  --  95*    @IMGRELPRIORS @ Medications:    . allopurinol  150 mg Oral Daily  . atorvastatin  20 mg Oral Daily  . calcium acetate  667 mg Oral TID WC  . diclofenac sodium  4 g Topical QID  . insulin aspart  0-5 Units Subcutaneous QHS  . insulin aspart  0-9 Units Subcutaneous TID WC  . metoprolol succinate  25 mg Oral BID  . midodrine  5 mg Oral TID WC  . pantoprazole  40 mg Oral Daily  . Warfarin - Pharmacist Dosing Inpatient   Does not apply q1800   Dialysis Orders: MWFS 4hrs, BFR 400, DFR 800, EDW 75kg, 2K/2.25Ca, TDC  Heparin 4200 units IV venofer 50mg  IV qwk - last 9/19 Mircera 214mcg IV q2wks - last 9/19  Assessment/ Plan:   1. Suspected PEA arrest- occurred at HD and required 2 min of CPR.  Cardiology consulted and started workup.  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 2. ESRD has been on HD qMWFSat due to  ongoing issues with hypotension and CMP.  Will plan for HD again tomorrow and UF as tolerated 3. Anemia:  S/p micera 9/19 4. CKD-MBD: cont binders and follow 5. Lung opacities- ?pneumonia vs atelectasis 6. Vascular access- has some edema and erythema at anastamosis site will consult VVS to evaluate. 7. Nutrition: renal diet 8. Hypertension: low bp on midodrine and may need to increase to 10mg  tid  [name], MD [place] Pager [phone] 08/11/2017, 9:10 AM

## 2017-08-11 NOTE — Progress Notes (Signed)
  Date: 08/11/2017  Patient name: Michael [name]  Medical record number: [id number]  Date of birth: 1961-02-04   I have seen and evaluated [place] and discussed their care with the Residency Team. Briefly, Mr. Yaffe is a 56yo man with PMH of DM2, ESRD on HD, Afib, HFpEF, GERD, mitral valve replacement and tricuspid valve repair presenting after a cardiac arrest in HD.  Apparently, he presented to HD and had the therapy for about 30 minutes when he was unresponsive, tensed up and became pulseless and apneic.  CPR was performed and the patient became responsive and had ROSC.  He bit his tongue and had suction of blood from the mouth.  The patient does not remember these events, but does have persistent chest pain which is from chest compressions.  The only preceding symptom was sleepiness.  Further symptoms include pain and swelling of the left arm distal to the fistula placed about 1 month ago.    Soc Hx : He is a former smoker.    Vitals:   08/11/17 0443 08/11/17 0745  BP: 101/75 97/73  Pulse: 82 77  Resp: 15 18  Temp: 97.6 F (36.4 C) (!) 97.5 F (36.4 C)  SpO2: 100% 100%   Physical Exam:  Gen: Alert, awake, complaining of chest pain but not in distress Eyes: Anicteric sclerae, no conjunctival pallor CV: TTP over anterior chest, irreg irreg, normal rate Pulm: CTAB, no wheezing or rales noted Abd: soft, NT, ND, +BS Ext: He has an AVF in the left antecubital fossa, erythema and swelling distal to this site which appears to be improved from admission.  No longer a significant amount of warmth note.  No thrill or bruit noted.  LE show no edema Skin: He has some bruising on the right medial thigh and right lateral lower leg.  He has a fine petechial rash on the lateral right ankle.  He is not sure how long they have been there Psych: Alert and awake, somewhat flat affect.   Pertinent data  Cr 3.98 TnI 0.03 (trending down) H/H 9.9/32.6  Plt 95 INR 2.1  CT head  without acute abnormality.   EKG: reviewed by me: Afib with a RBBB.  Block appears old based on previous EKGs  Assessment and Plan: I have seen and evaluated the patient as outlined above. I agree with the formulated Assessment and Plan as detailed in the residents' note, with the following changes:   1. Cardiac arrest/syncope - Myoview planned - Telemetry - TnI peaked and are trending down - likely related to CPR - Chest pain related to CPR, Cards to order lidocaine patches - TTE - Cardiology consulted and following.  - VQ scan for cardiac arrest and elevated D dimer (well's score is low at 0)  2. Left upper extremity swelling, erythema - Continue antibiotics - Follow up cultures - Vascular consult  Other issues per resident daily note.   Sid Falcon, MD 9/25/201811:15 AM

## 2017-08-12 ENCOUNTER — Inpatient Hospital Stay (HOSPITAL_COMMUNITY): Payer: Medicare Other

## 2017-08-12 ENCOUNTER — Ambulatory Visit (HOSPITAL_COMMUNITY): Payer: Medicare Other

## 2017-08-12 ENCOUNTER — Other Ambulatory Visit: Payer: Self-pay

## 2017-08-12 DIAGNOSIS — Z992 Dependence on renal dialysis: Secondary | ICD-10-CM

## 2017-08-12 DIAGNOSIS — R748 Abnormal levels of other serum enzymes: Secondary | ICD-10-CM

## 2017-08-12 DIAGNOSIS — M7989 Other specified soft tissue disorders: Secondary | ICD-10-CM

## 2017-08-12 DIAGNOSIS — R079 Chest pain, unspecified: Secondary | ICD-10-CM | POA: Diagnosis present

## 2017-08-12 DIAGNOSIS — R55 Syncope and collapse: Secondary | ICD-10-CM

## 2017-08-12 DIAGNOSIS — R072 Precordial pain: Secondary | ICD-10-CM

## 2017-08-12 DIAGNOSIS — M79642 Pain in left hand: Secondary | ICD-10-CM

## 2017-08-12 LAB — CBC
HEMATOCRIT: 30.9 % — AB (ref 39.0–52.0)
HEMOGLOBIN: 10 g/dL — AB (ref 13.0–17.0)
MCH: 27.2 pg (ref 26.0–34.0)
MCHC: 32.4 g/dL (ref 30.0–36.0)
MCV: 84.2 fL (ref 78.0–100.0)
Platelets: 135 10*3/uL — ABNORMAL LOW (ref 150–400)
RBC: 3.67 MIL/uL — ABNORMAL LOW (ref 4.22–5.81)
RDW: 19.5 % — AB (ref 11.5–15.5)
WBC: 6.3 10*3/uL (ref 4.0–10.5)

## 2017-08-12 LAB — GLUCOSE, CAPILLARY
GLUCOSE-CAPILLARY: 129 mg/dL — AB (ref 65–99)
Glucose-Capillary: 82 mg/dL (ref 65–99)
Glucose-Capillary: 88 mg/dL (ref 65–99)

## 2017-08-12 LAB — RENAL FUNCTION PANEL
Albumin: 2.7 g/dL — ABNORMAL LOW (ref 3.5–5.0)
Anion gap: 11 (ref 5–15)
BUN: 24 mg/dL — ABNORMAL HIGH (ref 6–20)
CALCIUM: 8.3 mg/dL — AB (ref 8.9–10.3)
CO2: 26 mmol/L (ref 22–32)
CREATININE: 4.01 mg/dL — AB (ref 0.61–1.24)
Chloride: 97 mmol/L — ABNORMAL LOW (ref 101–111)
GFR, EST AFRICAN AMERICAN: 18 mL/min — AB (ref >60)
GFR, EST NON AFRICAN AMERICAN: 15 mL/min — AB (ref >60)
Glucose, Bld: 126 mg/dL — ABNORMAL HIGH (ref 65–99)
Phosphorus: 3.8 mg/dL (ref 2.5–4.6)
Potassium: 3.8 mmol/L (ref 3.5–5.1)
SODIUM: 134 mmol/L — AB (ref 135–145)

## 2017-08-12 LAB — SEDIMENTATION RATE: SED RATE: 35 mm/h — AB (ref 0–16)

## 2017-08-12 LAB — HEPATIC FUNCTION PANEL
ALT: 12 U/L — ABNORMAL LOW (ref 17–63)
AST: 24 U/L (ref 15–41)
Albumin: 2.7 g/dL — ABNORMAL LOW (ref 3.5–5.0)
Alkaline Phosphatase: 150 U/L — ABNORMAL HIGH (ref 38–126)
BILIRUBIN INDIRECT: 1.1 mg/dL — AB (ref 0.3–0.9)
Bilirubin, Direct: 0.8 mg/dL — ABNORMAL HIGH (ref 0.1–0.5)
TOTAL PROTEIN: 6.4 g/dL — AB (ref 6.5–8.1)
Total Bilirubin: 1.9 mg/dL — ABNORMAL HIGH (ref 0.3–1.2)

## 2017-08-12 LAB — PROTIME-INR
INR: 2.86
Prothrombin Time: 29.8 seconds — ABNORMAL HIGH (ref 11.4–15.2)

## 2017-08-12 MED ORDER — CEFAZOLIN SODIUM-DEXTROSE 1-4 GM/50ML-% IV SOLN
1.0000 g | Freq: Once | INTRAVENOUS | Status: DC
  Filled 2017-08-12: qty 50

## 2017-08-12 MED ORDER — MORPHINE SULFATE (PF) 4 MG/ML IV SOLN
INTRAVENOUS | Status: AC
  Administered 2017-08-12: 1 mg
  Filled 2017-08-12: qty 1

## 2017-08-12 MED ORDER — AMIODARONE LOAD VIA INFUSION
150.0000 mg | Freq: Once | INTRAVENOUS | Status: AC
  Administered 2017-08-12: 150 mg via INTRAVENOUS
  Filled 2017-08-12: qty 83.34

## 2017-08-12 MED ORDER — CEFAZOLIN SODIUM-DEXTROSE 1-4 GM/50ML-% IV SOLN
1.0000 g | INTRAVENOUS | Status: DC
  Administered 2017-08-13: 1 g via INTRAVENOUS
  Filled 2017-08-12: qty 50

## 2017-08-12 MED ORDER — AMIODARONE HCL IN DEXTROSE 360-4.14 MG/200ML-% IV SOLN
30.0000 mg/h | INTRAVENOUS | Status: DC
  Administered 2017-08-12 – 2017-08-13 (×3): 30 mg/h via INTRAVENOUS
  Filled 2017-08-12 (×2): qty 200

## 2017-08-12 MED ORDER — MORPHINE SULFATE (PF) 2 MG/ML IV SOLN
1.0000 mg | INTRAVENOUS | Status: DC | PRN
  Administered 2017-08-17 – 2017-08-22 (×4): 1 mg via INTRAVENOUS
  Filled 2017-08-12 (×4): qty 1

## 2017-08-12 MED ORDER — AMIODARONE HCL IN DEXTROSE 360-4.14 MG/200ML-% IV SOLN
INTRAVENOUS | Status: AC
  Administered 2017-08-12: 60 mg/h via INTRAVENOUS
  Filled 2017-08-12: qty 200

## 2017-08-12 MED ORDER — AMIODARONE HCL IN DEXTROSE 360-4.14 MG/200ML-% IV SOLN
60.0000 mg/h | INTRAVENOUS | Status: AC
  Administered 2017-08-12: 60 mg/h via INTRAVENOUS
  Filled 2017-08-12: qty 200

## 2017-08-12 NOTE — Progress Notes (Signed)
ANTICOAGULATION CONSULT NOTE  Pharmacy Consult for Coumadin Indication: atrial fibrillation  No Known Allergies  Vital Signs: Temp: 97.9 F (36.6 C) (09/26 0349) Temp Source: Oral (09/26 0349) BP: 96/64 (09/26 0349) Pulse Rate: 105 (09/26 0349)  Labs:  Recent Labs  08/10/17 1523 08/10/17 1812 08/11/17 0004 08/11/17 1601 08/11/17 1513 08/12/17 0554 08/12/17 0758  HGB  --   --   --  9.9* 11.2*  --  10.0*  HCT  --   --   --  32.6* 35.5*  --  30.9*  PLT  --   --   --  95* 99*  --  135*  LABPROT 20.7*  --   --  23.4*  --  29.8*  --   INR 1.79  --   --  2.10  --  2.86  --   CREATININE  --   --   --  3.98* 3.90* 4.01*  --   TROPONINI  --  0.05* 0.04* 0.03*  --   --   --     Estimated Creatinine Clearance: 19.4 mL/min (A) (by C-G formula based on SCr of 4.01 mg/dL (H)).   Medical History: Past Medical History:  Diagnosis Date  . Anemia of chronic disease   . Asthma    said he does not know  . Chronic kidney disease (CKD), stage IV (severe) ([place])   . Chronic systolic CHF (congestive heart failure) (HCC)    a. prior EF normal; b. Echo 10/16: Mild LVH, EF 30-35%, mitral valve bioprosthesis present without evidence of stenosis or regurgitation, severe LAE, mild RVE with mild to moderately reduced RVSF, moderate RAE  . Coronary artery disease   . Diabetes mellitus type 2 in obese ([place])   . Diabetes mellitus without complication ([place])   . Dyspnea   . GERD (gastroesophageal reflux disease)   . H/O tricuspid valve repair 2012  . History of echocardiogram    Echo at [facility] in [facility] 4/12: mod LVH, EF 60-65%, mod LAE, tissue MVR ok, mod TR, mod pulmo HTN with RVSP 48 mmHg  . Hypertension   . Obstructive sleep apnea   . Paroxysmal atrial fibrillation (HCC)    s/p Maze procedure at time of MVR in 2011  . Pulmonary HTN ([place])   . S/P mitral valve replacement with porcine valve 2012    Assessment: 56yom with suspected cardiac arrest on HD center on coumadin pta for  afib/bioprosthetic MVR. INR on admit subtherapeutic at 1.79. Pharmacy consulted to dose coumadin.   -INR= 2.86 today (up from 2.1)  Home dose: 2.5mg  daily - last taken 9/23  Goal of Therapy:  INR 2-3 Monitor platelets by anticoagulation protocol: Yes   Plan:  -Hold coumadin today due to INR trend up -Daily PT/INR  Hildred Laser, Pharm D 08/12/2017 8:31 AM

## 2017-08-12 NOTE — Progress Notes (Signed)
**  Preliminary report by tech**  Left upper extremity venous duplex complete. There is no evidence of deep or superficial vein thrombosis involving the left upper extremity. All visualized vessels appear patent and compressible.  08/12/17 4:26 PM Carlos Levering RVT

## 2017-08-12 NOTE — Progress Notes (Addendum)
Progress Note  Patient Name: Michael [name] Date of Encounter: 08/12/2017  Primary Cardiologist: Dr. Harrington Challenger  Subjective   Patient seen in HD.  Screaming in pain from his chest and R 2nd digit.  He had atrial fibrillation with RVR.  BP dropped to the 70s and he briefly lost consciousness.    Inpatient Medications    Scheduled Meds: . allopurinol  150 mg Oral Daily  . atorvastatin  20 mg Oral Daily  . calcium acetate  667 mg Oral TID WC  . diclofenac sodium  4 g Topical QID  . insulin aspart  0-5 Units Subcutaneous QHS  . insulin aspart  0-9 Units Subcutaneous TID WC  . lidocaine  1 patch Transdermal Q24H  . metoprolol succinate  25 mg Oral BID  . midodrine  10 mg Oral TID WC  . pantoprazole  40 mg Oral Daily  . Warfarin - Pharmacist Dosing Inpatient   Does not apply q1800   Continuous Infusions: . sodium chloride    . sodium chloride    . amiodarone 60 mg/hr (08/12/17 1133)   Followed by  . amiodarone    .  ceFAZolin (ANCEF) IV     Followed by  . [START ON 08/13/2017]  ceFAZolin (ANCEF) IV    . ferric gluconate (FERRLECIT/NULECIT) IV     PRN Meds: sodium chloride, sodium chloride, alteplase, heparin, heparin, HYDROcodone-acetaminophen, lidocaine (PF), lidocaine-prilocaine, morphine injection, pentafluoroprop-tetrafluoroeth, senna-docusate   Vital Signs    Vitals:   08/12/17 1000 08/12/17 1015 08/12/17 1030 08/12/17 1051  BP: 101/63 (!) 70/51 91/62 120/88  Pulse: 83 (!) 101 (!) 104 (!) 103  Resp: 13 15 16 18   Temp:    98.5 F (36.9 C)  TempSrc:    Oral  SpO2: 94%   99%  Weight:    74.4 kg (164 lb 0.4 oz)  Height:        Intake/Output Summary (Last 24 hours) at 08/12/17 1148 Last data filed at 08/12/17 1051  Gross per 24 hour  Intake              710 ml  Output             1395 ml  Net             -685 ml   Filed Weights   08/12/17 0349 08/12/17 0745 08/12/17 1051  Weight: 76.7 kg (169 lb 3.2 oz) 75.7 kg (166 lb 14.2 oz) 74.4 kg (164 lb 0.4 oz)     Telemetry    Atrial fibrillation in the 70's-130s - Personally Reviewed  ECG    Atrial fibrillation at 130 bpm with RBBB, LAD, nonspecific T wave changes laterally- Personally Reviewed  Physical Exam   GEN: Acute distress.   Neck: No JVD Cardiac: irregularly irregular rhythm.  Tachycardic, 3/6 systolic murmur  Chest: Chest wall tenderness to palpation.  Respiratory: Clear to auscultation bilaterally. GI: Soft, nontender, non-distended  MS: R digits cyanotic distally. Edema of the L wrist.  Erythma significantly improved.  Edema of R fingers. R 2nd digit exquisitely tender to touch.  2+ radial pulses. No deformity. Neuro:  Nonfocal  Psych: Normal affect  Redness and swelling of the left wrist area  Labs    Chemistry Recent Labs Lab 08/10/17 1400  08/11/17 0642 08/11/17 1513 08/12/17 0554 08/12/17 0758  NA 133*  < > 135 133* 134*  --   K 3.9  < > 4.0 4.1 3.8  --   CL 97*  < >  99* 98* 97*  --   CO2 26  --  27 27 26   --   GLUCOSE 93  < > 97 68 126*  --   BUN 15  < > 20 21* 24*  --   CREATININE 3.53*  < > 3.98* 3.90* 4.01*  --   CALCIUM 8.7*  --  8.4* 8.6* 8.3*  --   PROT 6.8  --   --   --   --  6.4*  ALBUMIN 3.2*  --   --  3.0* 2.7* 2.7*  AST 30  --   --   --   --  24  ALT 13*  --   --   --   --  12*  ALKPHOS 142*  --   --   --   --  150*  BILITOT 2.8*  --   --   --   --  1.9*  GFRNONAA 18*  --  15* 16* 15*  --   GFRAA 21*  --  18* 18* 18*  --   ANIONGAP 10  --  9 8 11   --   < > = values in this interval not displayed.   Hematology  Recent Labs Lab 08/11/17 0642 08/11/17 1513 08/12/17 0758  WBC 5.5 5.5 6.3  RBC 3.78* 4.14* 3.67*  HGB 9.9* 11.2* 10.0*  HCT 32.6* 35.5* 30.9*  MCV 86.2 85.7 84.2  MCH 26.2 27.1 27.2  MCHC 30.4 31.5 32.4  RDW 19.3* 19.1* 19.5*  PLT 95* 99* 135*    Cardiac Enzymes  Recent Labs Lab 08/10/17 1812 08/11/17 0004 08/11/17 0642  TROPONINI 0.05* 0.04* 0.03*     Recent Labs Lab 08/10/17 1414  TROPIPOC 0.03      BNPNo results for input(s): BNP, PROBNP in the last 168 hours.   DDimer No results for input(s): DDIMER in the last 168 hours.   Radiology    Ct Head Wo Contrast  Result Date: 08/10/2017 CLINICAL DATA:  Patient fell and struck head. EXAM: CT HEAD WITHOUT CONTRAST TECHNIQUE: Contiguous axial images were obtained from the base of the skull through the vertex without intravenous contrast. COMPARISON:  None. FINDINGS: Brain: There is no evidence for acute hemorrhage, hydrocephalus, mass lesion, or abnormal extra-axial fluid collection. No definite CT evidence for acute infarction. Vascular: No hyperdense vessel or unexpected calcification. Skull: No evidence for fracture. No worrisome lytic or sclerotic lesion. Sinuses/Orbits: Visualized portions of the globes and intraorbital fat are unremarkable. The visualized paranasal sinuses and mastoid air cells are clear. Other: None. IMPRESSION: 1. No acute intracranial abnormality. Electronically Signed   By: Misty Stanley M.D.   On: 08/10/2017 16:04   Dg Chest Port 1 View  Result Date: 08/10/2017 CLINICAL DATA:  Patient unresponsive during dialysis. EXAM: PORTABLE CHEST 1 VIEW COMPARISON:  Chest radiograph 07/13/2017 FINDINGS: Central venous catheter tip projects over the superior vena cava. Stable cardiomegaly status post median sternotomy. Heterogeneous opacities right lung base. Small right pleural effusion. IMPRESSION: Small right pleural effusion with heterogeneous opacities right lung base which may represent infection or atelectasis. Electronically Signed   By: Lovey Newcomer M.D.   On: 08/10/2017 14:31    Cardiac Studies   07/09/17 TEE  Study Conclusions - Left ventricle: The cavity size was normal. Wall thickness was normal. Systolic function was normal. The estimated ejection fraction was in the range of 55% to 60%. Wall motion was normal; there were no regional wall motion abnormalities. No evidence of thrombus. - Aortic valve:  There was no stenosis. There was moderate regurgitation. - Aorta: Normal caliber aorta with grade III plaque in descending thoracic aorta. - Mitral valve: Status post mitral valve replacement with bioprosthetic valve. Trivial MR. Mean gradient 6 mmHg across valve, does not appear to have significant stenosis. - Left atrium: The atrium was severely dilated. The LA appendage appears to have been ligated. No LA thrombus. - Right ventricle: D-shaped interventricular septum suggestive of RV pressure/volume overload. The RV was severely dilated with moderately decreased systolic function. - Right atrium: The atrium was moderately dilated. - Atrial septum: No defect or patent foramen ovale was identified. 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 Peak RV-RA gradient 25 mmHg.  Impressions: - Proceed to DCCV. Successful cardioversion. No cardiac source of emboli was indentified.   RT HEART CATH 06/29/17   Conclusion  1. Elevated right and left heart filling pressures, more prominent RV failure.  2. Pulmonary venous hypertension.  3. Cardiac output vigorous on milrinone.    Right Heart  Right Heart Pressures RHC Procedural Findings: Hemodynamics (mmHg) RA mean 27 RV 63/8, mean 20 PA 58/28, mean 40 PCWP mean 28  Oxygen saturations: PA 69% AO 96%  Cardiac Output (Fick) 9.82  Cardiac Index (Fick) 4.96 PVR 1.2 WU     TTE 06/01/17 Study Conclusions - Left ventricle: The cavity size was normal. There was mild concentric hypertrophy. Systolic function was normal. The estimated ejection fraction was in the range of 50% to 55%. Wall motion was normal; there were no regional wall motion abnormalities. - Ventricular septum: The contour showed diastolic flattening. - Aortic valve: Transvalvular velocity was minimally increased. There was no stenosis. There was mild regurgitation. Peak velocity (S): 201 cm/s. - Mitral  valve: A bioprosthesis was present and functioning normally. Mean gradient (D): 6 mm Hg. Valve area by pressure half-time: 1.93 cm^2. - Left atrium: The atrium was severely dilated. - Right ventricle: The cavity size was moderately dilated. Wall thickness was normal. Systolic function was moderately reduced. - Right atrium: The atrium was moderately dilated. - Tricuspid valve: There was severe regurgitation. - Pulmonary arteries: Systolic pressure was moderately increased. PA peak pressure: 54 mm Hg (S).   Patient Profile     56 y.o. male with a hx of MV disorder, with MVR with bioprosthetic MV, 2012 at [facility] in [place], also tricuspid valve repair., CAD, PAF, ESRD on HD since recent admit earlier this month, OSA, DM, HTN, HLD, CHF, pulmonary HTN, anemia who had cardiac arrest vs seizure while getting dialysis.  Assessment & Plan    # Atrial fibrillation with RVR: Mr. Wandell went into atrial fibirllation with RVR today during HD.  BP dropped and he was briefly unresponsive.  Similar to the episode that occurred in HD prior to admission.  Continue warfarin and metoprolol.    # Cardiac arrest: Presumed PEA.  No telemetry available for the initial episode in HD prior to admission.  Today he became unresponsive with in afib with RVR.  The second portion of his Leane Call was stopped early 9/25 2/2 hypotension.  If stable, may be able to go for day 2.    # Chest pain: Suspect this is 2/2 CPR.  He is is excruciating pain with movement in the bed.  Will check rib xrays.   # MVR: # Tricuspid repair; Stable on echo this admission.  # Chronic diastolic heart failure: # Severe pulmonary hypertension:  # RV failure; Continue metoprolol and volume management with HD  as BP tolerates.  Continue midodrine.   # Hyperlipidemia: continue statin   Time spent: 50 minutes-Greater than 50% of this time was spent in counseling, explanation of diagnosis, planning of further management,  and coordination of care.   For questions or updates, please contact [place] Please consult [email or web address] for contact info under Cardiology/STEMI.      Signed, Skeet Latch, MD  08/12/2017, 11:48 AM

## 2017-08-12 NOTE — Significant Event (Signed)
Rapid Response Event Note  Overview:  Called to come to bedside for assistance Time Called: 1039 Arrival Time: 1044 Event Type: Cardiac, Hypotension  Initial Focused Assessment:  Called to come to bedside for assistance due to patient suddenly became unresponsive briefly with hypotension SBP 70's and HR 140's.  On my arrival to patients bedside, patient in trendelenburg is alert and moaning in pain.     Interventions:  HD had been stopped prior to my arrival.  EKG ordered per protocol.  Primary RN called to bedside.  Cardiology MD called and came to bedside.  amio drip ordered by MD.  Hulen Skains primary MD to come to bedside.    Plan of Care (if not transferred):  RN to monitor and patient to return to his room  Event Summary:  RN to call if assistance needed   at      at          [facility], Harlin Rain

## 2017-08-12 NOTE — Procedures (Signed)
I was present at this dialysis session. I have reviewed the session itself and made appropriate changes.   Filed Weights   08/11/17 0443 08/12/17 0349  Weight: 76.2 kg (167 lb 14.4 oz) 76.7 kg (169 lb 3.2 oz)     Recent Labs Lab 08/12/17 0554  NA 134*  K 3.8  CL 97*  CO2 26  GLUCOSE 126*  BUN 24*  CREATININE 4.01*  CALCIUM 8.3*  PHOS 3.8     Recent Labs Lab 08/11/17 0642 08/11/17 1513 08/12/17 0758  WBC 5.5 5.5 6.3  HGB 9.9* 11.2* 10.0*  HCT 32.6* 35.5* 30.9*  MCV 86.2 85.7 84.2  PLT 95* 99* 135*    Scheduled Meds: . allopurinol  150 mg Oral Daily  . atorvastatin  20 mg Oral Daily  . calcium acetate  667 mg Oral TID WC  . diclofenac sodium  4 g Topical QID  . insulin aspart  0-5 Units Subcutaneous QHS  . insulin aspart  0-9 Units Subcutaneous TID WC  . lidocaine  1 patch Transdermal Q24H  . metoprolol succinate  25 mg Oral BID  . midodrine  10 mg Oral TID WC  . pantoprazole  40 mg Oral Daily  . Warfarin - Pharmacist Dosing Inpatient   Does not apply q1800   Continuous Infusions: . sodium chloride    . sodium chloride    . ceFEPime (MAXIPIME) IV Stopped (08/11/17 1806)  . ferric gluconate (FERRLECIT/NULECIT) IV     PRN Meds:.sodium chloride, sodium chloride, alteplase, heparin, heparin, HYDROcodone-acetaminophen, lidocaine (PF), lidocaine-prilocaine, pentafluoroprop-tetrafluoroeth, senna-docusate    Dialysis Orders:MWFS4hrs, BFR 400, DFR 800, EDW 75kg, 2K/2.25Ca, TDC  Heparin 4200 units IV venofer 50mg  IV qwk - last 9/19 Mircera 271mcg IV q2wks - last 9/19  Assessment/ Plan:   1. Suspected PEA arrest- occurred at HD and required 2 min of CPR.  Cardiology consulted and started workup.  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 2. ESRD has been on HD qMWFSat due to ongoing issues with hypotension and CMP.  HD again today and UF as tolerated 3. Anemia:  S/p micera 9/19 4. CKD-MBD: cont binders and follow 5. Lung opacities- ?pneumonia vs  atelectasis 6. Vascular access- has some edema and erythema at anastamosis site and appreciate consult VVS for duplex of AVF later today. 7. Nutrition: renal diet 8. Hypertension: low bp on midodrine and increased to 10mg  tid 9. Disposition- poor overall prognosis due to his ongoing issues with hypotension and poor cardiac function.   Recommend Palliative Care Consult to help set goals/limits of care.  They were involved at his last hospitalization.   [name],  MD 08/12/2017, 8:47 AM

## 2017-08-12 NOTE — Progress Notes (Signed)
  Date: 08/12/2017  Patient name: Michael [name]  Medical record number: [id number]  Date of birth: 04-25-1961   I have seen and evaluated this patient and I have discussed the plan of care with the house staff. Please see Dr. Tally Joe note for complete details. I concur with his findings with the following additions/corrections:   Patient with persistent elevated ALP (new from last results, had risen earlier this year and resolved.  Abd ultrasound in the past revealed GB wall thickening.  He how also has persistent elevation in bilirubin, mild and a cutaneous rash which appears vasculitic.  Based on imaging in the past including CT scan and ultrasound of abdomen there is a suggestion of liver cirrhosis.  He also has ESRD and CHF to explain ascites and volume overload.  His hepatitis panel in 2017 was normal.  He has quite a bit going on currently as well documented in Dr. Tally Joe note.  Once he has improved would consider repeat abdominal ultrasound for further evaluation.  Sed rate mildly elevated which is not consistent with rheumatologic disease and might be related to acute cellulitis ongoing in left arm which is being treated.  He is also due for ultrasound of left arm as he has concerning findings of cool digits, increasing pain in that arm.  Vascular surgery has evaluated his newly placed fistula in that arm.   Sid Falcon, MD 08/12/2017, 2:34 PM

## 2017-08-12 NOTE — Progress Notes (Signed)
Subjective: Patient was seen while receiving hemodialysis this morning. Continues to have post CPR chest pain. He state that the had RLE pain is improved, but he is experiencing pain in his left hand with ne pain in his right firgertips. He expressed some concern over his persistently low blood pressure and was provided reassurance.  Event - Paged at 11 am by rapid response nurse and went to see patient in HD. Patient had an episode Increased pain. His face had become red in color and he was briefly not responsive. His systolic BP dropped to 04U and HR was A. Fib (chronic) with RVR to 140s. Patient was seen by cardiologist who responded. She ordered amiodarone for rhythm, CXR to evaluate for fracture. Patient also received a bolus of fluid. He was A&O when we saw him BP 120s, HR 110s. States that he just has his same pains as earlier this morning.   Objective:  Vital signs in last 24 hours: Vitals:   08/11/17 2000 08/12/17 0000 08/12/17 0200 08/12/17 0349  BP: 101/72 97/64 104/63 96/64  Pulse: 90 (!) 104 94 (!) 105  Resp: '15 17 14 18  ' Temp:    97.9 F (36.6 C)  TempSrc:    Oral  SpO2: 95% 99% 97% 97%  Weight:    169 lb 3.2 oz (76.7 kg)  Height:       Physical Exam  Constitutional: He is oriented to person, place, and time. He appears well-developed and well-nourished.  HENT:  Head: Normocephalic and atraumatic.  Eyes: EOM are normal. Right eye exhibits no discharge. Left eye exhibits no discharge.  Cardiovascular: Normal rate.   Irregularly irregular rhythm  Pulmonary/Chest: Effort normal and breath sounds normal. No respiratory distress. He has no wheezes.  Abdominal: Soft. Bowel sounds are normal. He exhibits no distension. There is no tenderness.  Musculoskeletal:  Edema and erythema of LUE Left 2-5th digits tender R 2nd digit tender No LE edema  Neurological: He is alert and oriented to person, place, and time.  Skin: Skin is warm and dry.  Erythema and warmth at L forearm      Assessment/Plan:  Michael [name] is a 56 yo M with a history of DM, ESRD on HD MWFSat, HTN, Chronic A. 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 Bystanders suspected sudden cardiac arrest as he was reportedly apneic and pulseless. CPR was performed and patient spontaneously regained consciousness within minutes. Differential includes sudden cardiac arrest, seizure (patient was seen with his body tensed before loosing consciousness and bit his tongue, but without history of seizure or reported convulsions); pulmonary embolism [at risk with potential LUE DVT (as below), Well's 4.5, 16.2% risk, [place] 9pts, 20-30% incidence]. CT angio cancelled due to recent initiation of HD. V/Q ordered initially, but cancelled due to conflict with Myoview study (time needed between nuclear medicine studies). Troponin trend 0.05, 0.04, 0.03. Repeat EKG 9/25, unchanged to prior. Patient also has history of chronic hypotension.  - Appreciate cardiology recommendations  - Midodrine 28m TID (for Hypotension) - Cardiac monitoring - Echocardiogram - Myoview - Voltaren gel - Norco 553m 1-2 q4h PRN  Left Upper extremity Pain: Patient complained of 2 days of increasing left forearm swelling and pain. Patient had left antecubital fistula placed on 8/27. Fistula is  without thrill and possibly clotted. Questionable cellulitis vs phlebitis. Excellent thrill with adequate healing on exam by vascular surgery. We appreciate their recommendations. Considering LUE xray following results of doppler. - Left upper extremity doppler - Cefazolin 1g now, then 1 g q24h  Chronic A. Fib: Episode of RVR in dialysis 9/26. On Warfarin, Subtherapeutic on admission. - Metoprolol  succinate 2m BID - Amiodarone 1578monce, 6018mr for 6 hrs, 12m25m continuous - Warfarin (per pharmacy)  Code Sepsis: Patient presented with hypotension and elevated Lactate 2.64. CXR showed heterogeneous opacities at right lung base (possible aspiration pneumonitis from event). Possible Cellulitis at LUE. Vancomycin and cefepime started in the ED. Afebrile, WBC 7.6. Blood Culture NGTD. Vancomycin and Cefepime discontinued. - Cefazolin 1g now, then 1 g q24h - CBC in AM  Right Lower Extremity Rash: Elevated T. Bili on presentation. Considering vasculitis also in the setting of concurrent LUE symptoms. T. Bili improved from presentation 2.8 -> 1.8 (9/26)  - ESR 35  DM: On 10 units Levemir qhs at home, currently has run out.  - SSI  ESRD on HD MWFS:  - Nephrology on board - Phoslo 667mg73m AC - Nulecit 125mg 54mS MWF (HD) - Midodrine 5mg TI52mC, for hypotension - Renal function panel in AM   FEN: NPO DVT Prophylaxis: Warfarin Code Status: Full Code  Dispo: Anticipated discharge in approximately 1-3 day(s).   Michael [name],Michael Seat26/2018, 7:27 AM Pager: [phone]

## 2017-08-12 NOTE — Progress Notes (Signed)
   VASCULAR SURGERY ASSESSMENT & PLAN:   Still with some swelling left forearm and forearm pain. He may have phlebitis. He did have a previous venous duplex scan earlier in September which showed no evidence of DVT in the left upper extremity. I have ordered a follow up duplex.  His first stage basilic vein transposition is patent with an excellent thrill.  He does not have any evidence of steal. He has a palpable radial pulse.  SUBJECTIVE:   Still with some mild left forearm pain.  PHYSICAL EXAM:   Vitals:   08/11/17 2000 08/12/17 0000 08/12/17 0200 08/12/17 0349  BP: 101/72 97/64 104/63 96/64  Pulse: 90 (!) 104 94 (!) 105  Resp: 15 17 14 18   Temp:    97.9 F (36.6 C)  TempSrc:    Oral  SpO2: 95% 99% 97% 97%  Weight:    169 lb 3.2 oz (76.7 kg)  Height:       Excellent thrill in left upper arm fistula. Palpable left radial pulse. Mild swelling left forearm.  LABS:   Lab Results  Component Value Date   WBC 5.5 08/11/2017   HGB 11.2 (L) 08/11/2017   HCT 35.5 (L) 08/11/2017   MCV 85.7 08/11/2017   PLT 99 (L) 08/11/2017   Lab Results  Component Value Date   CREATININE 3.90 (H) 08/11/2017   Lab Results  Component Value Date   INR 2.86 08/12/2017   CBG (last 3)   Recent Labs  08/11/17 0743 08/11/17 1705 08/11/17 2105  GLUCAP 85 81 129*    PROBLEM LIST:    Active Problems:   DM type 2 (diabetes mellitus, type 2) (HCC)   Permanent atrial fibrillation (HCC)   ESRD (end stage renal disease) on dialysis (HCC)   Syncope and collapse   CURRENT MEDS:   . allopurinol  150 mg Oral Daily  . atorvastatin  20 mg Oral Daily  . calcium acetate  667 mg Oral TID WC  . diclofenac sodium  4 g Topical QID  . insulin aspart  0-5 Units Subcutaneous QHS  . insulin aspart  0-9 Units Subcutaneous TID WC  . lidocaine  1 patch Transdermal Q24H  . metoprolol succinate  25 mg Oral BID  . midodrine  10 mg Oral TID WC  . pantoprazole  40 mg Oral Daily  . Warfarin -  Pharmacist Dosing Inpatient   Does not apply [place]: [phone] Office: [phone] 08/12/2017

## 2017-08-12 NOTE — Plan of Care (Signed)
Problem: Safety: Goal: Ability to remain free from injury will improve Outcome: Progressing Patient educated on call light system and verbalizes understanding of need to call for assistance prior to ambulation when necessary

## 2017-08-13 ENCOUNTER — Ambulatory Visit (HOSPITAL_COMMUNITY): Payer: Medicare Other

## 2017-08-13 DIAGNOSIS — R0789 Other chest pain: Secondary | ICD-10-CM

## 2017-08-13 DIAGNOSIS — M109 Gout, unspecified: Secondary | ICD-10-CM

## 2017-08-13 DIAGNOSIS — I4891 Unspecified atrial fibrillation: Secondary | ICD-10-CM

## 2017-08-13 LAB — CBC
HEMATOCRIT: 32.9 % — AB (ref 39.0–52.0)
Hemoglobin: 10.4 g/dL — ABNORMAL LOW (ref 13.0–17.0)
MCH: 26.9 pg (ref 26.0–34.0)
MCHC: 31.6 g/dL (ref 30.0–36.0)
MCV: 85.2 fL (ref 78.0–100.0)
Platelets: 130 10*3/uL — ABNORMAL LOW (ref 150–400)
RBC: 3.86 MIL/uL — ABNORMAL LOW (ref 4.22–5.81)
RDW: 19.8 % — AB (ref 11.5–15.5)
WBC: 5.5 10*3/uL (ref 4.0–10.5)

## 2017-08-13 LAB — ECHOCARDIOGRAM COMPLETE
HEIGHTINCHES: 65 in
WEIGHTICAEL: 2624 [oz_av]

## 2017-08-13 LAB — BASIC METABOLIC PANEL
Anion gap: 9 (ref 5–15)
BUN: 15 mg/dL (ref 6–20)
CALCIUM: 8.5 mg/dL — AB (ref 8.9–10.3)
CO2: 25 mmol/L (ref 22–32)
Chloride: 101 mmol/L (ref 101–111)
Creatinine, Ser: 3.39 mg/dL — ABNORMAL HIGH (ref 0.61–1.24)
GFR calc Af Amer: 22 mL/min — ABNORMAL LOW (ref >60)
GFR, EST NON AFRICAN AMERICAN: 19 mL/min — AB (ref >60)
GLUCOSE: 86 mg/dL (ref 65–99)
Potassium: 4.1 mmol/L (ref 3.5–5.1)
Sodium: 135 mmol/L (ref 135–145)

## 2017-08-13 LAB — PROTIME-INR
INR: 3.4
PROTHROMBIN TIME: 34.1 s — AB (ref 11.4–15.2)

## 2017-08-13 LAB — GLUCOSE, CAPILLARY
GLUCOSE-CAPILLARY: 87 mg/dL (ref 65–99)
GLUCOSE-CAPILLARY: 147 mg/dL — AB (ref 65–99)
GLUCOSE-CAPILLARY: 152 mg/dL — AB (ref 65–99)
GLUCOSE-CAPILLARY: 118 mg/dL — AB (ref 65–99)

## 2017-08-13 MED ORDER — PREDNISONE 10 MG PO TABS
10.0000 mg | ORAL_TABLET | Freq: Every day | ORAL | Status: AC
  Administered 2017-08-13 – 2017-08-17 (×5): 10 mg via ORAL
  Filled 2017-08-13 (×5): qty 1

## 2017-08-13 MED ORDER — PREDNISOLONE 5 MG PO TABS: 10.0000 mg | ORAL_TABLET | Freq: Every day | ORAL | Status: DC

## 2017-08-13 NOTE — Progress Notes (Signed)
ANTICOAGULATION CONSULT NOTE  Pharmacy Consult for Coumadin Indication: atrial fibrillation  No Known Allergies  Vital Signs: Temp: 98.9 F (37.2 C) (09/27 0807) Temp Source: Oral (09/27 0807) BP: 111/74 (09/27 0807) Pulse Rate: 92 (09/27 0807)  Labs:  Recent Labs  08/10/17 1812 08/11/17 0004 08/11/17 4801 08/11/17 1513 08/12/17 0554 08/12/17 0758 08/13/17 0546  HGB  --   --  9.9* 11.2*  --  10.0* 10.4*  HCT  --   --  32.6* 35.5*  --  30.9* 32.9*  PLT  --   --  95* 99*  --  135* 130*  LABPROT  --   --  23.4*  --  29.8*  --  34.1*  INR  --   --  2.10  --  2.86  --  3.40  CREATININE  --   --  3.98* 3.90* 4.01*  --  3.39*  TROPONINI 0.05* 0.04* 0.03*  --   --   --   --     Estimated Creatinine Clearance: 22.7 mL/min (A) (by C-G formula based on SCr of 3.39 mg/dL (H)).   Medical History: Past Medical History:  Diagnosis Date  . Anemia of chronic disease   . Asthma    said he does not know  . Chronic kidney disease (CKD), stage IV (severe) ([place])   . Chronic systolic CHF (congestive heart failure) (HCC)    a. prior EF normal; b. Echo 10/16: Mild LVH, EF 30-35%, mitral valve bioprosthesis present without evidence of stenosis or regurgitation, severe LAE, mild RVE with mild to moderately reduced RVSF, moderate RAE  . Coronary artery disease   . Diabetes mellitus type 2 in obese ([place])   . Diabetes mellitus without complication ([place])   . Dyspnea   . GERD (gastroesophageal reflux disease)   . H/O tricuspid valve repair 2012  . History of echocardiogram    Echo at [facility] in [facility] 4/12: mod LVH, EF 60-65%, mod LAE, tissue MVR ok, mod TR, mod pulmo HTN with RVSP 48 mmHg  . Hypertension   . Obstructive sleep apnea   . Paroxysmal atrial fibrillation (HCC)    s/p Maze procedure at time of MVR in 2011  . Pulmonary HTN ([place])   . S/P mitral valve replacement with porcine valve 2012    Assessment: 56yom with suspected cardiac arrest on HD center on coumadin pta for  afib/bioprosthetic MVR. INR on admit subtherapeutic at 1.79. Venous doppler is negative.  Pharmacy consulted to dose coumadin.   -INR= 3.4 w/ trend up  Home dose: 2.5mg  daily - last taken 9/23  Goal of Therapy:  INR 2-3 Monitor platelets by anticoagulation protocol: Yes   Plan:  -Hold coumadin today -Daily PT/INR  Hildred Laser, Pharm D 08/13/2017 9:11 AM

## 2017-08-13 NOTE — Progress Notes (Signed)
   VASCULAR SURGERY ASSESSMENT & PLAN:   Duplex scan shows no evidence of DVT left upper extremity or evidence of superficial thrombophlebitis.  His first stage basilic vein transposition is working well and the incision is healing nicely.  He will keep his regularly scheduled follow up appointment with Dr. Donzetta Matters.  Vascular surgery will be available as needed.  SUBJECTIVE:   No specific complaints this morning  PHYSICAL EXAM:   Vitals:   08/13/17 0500 08/13/17 0807 08/13/17 0940 08/13/17 1158  BP: 102/63 111/74 109/80 113/69  Pulse: 78 92 84   Resp: 11 12    Temp:  98.9 F (37.2 C)  98.4 F (36.9 C)  TempSrc:  Oral  Oral  SpO2: 100% 97%  100%  Weight:      Height:       Excellent thrill in left upper arm first stage basilic vein transposition. Incision looks fine. Swelling left forearm has improved.  LABS:   Lab Results  Component Value Date   WBC 5.5 08/13/2017   HGB 10.4 (L) 08/13/2017   HCT 32.9 (L) 08/13/2017   MCV 85.2 08/13/2017   PLT 130 (L) 08/13/2017   Lab Results  Component Value Date   CREATININE 3.39 (H) 08/13/2017   Lab Results  Component Value Date   INR 3.40 08/13/2017   CBG (last 3)   Recent Labs  08/12/17 1958 08/13/17 0716 08/13/17 1112  GLUCAP 129* 87 147*    PROBLEM LIST:    Active Problems:   DM type 2 (diabetes mellitus, type 2) (HCC)   Permanent atrial fibrillation (HCC)   ESRD (end stage renal disease) on dialysis (HCC)   Syncope and collapse   Chest pain   CURRENT MEDS:   . allopurinol  150 mg Oral Daily  . atorvastatin  20 mg Oral Daily  . calcium acetate  667 mg Oral TID WC  . diclofenac sodium  4 g Topical QID  . insulin aspart  0-5 Units Subcutaneous QHS  . insulin aspart  0-9 Units Subcutaneous TID WC  . lidocaine  1 patch Transdermal Q24H  . metoprolol succinate  25 mg Oral BID  . midodrine  10 mg Oral TID WC  . pantoprazole  40 mg Oral Daily  . predniSONE  10 mg Oral Q breakfast  . Warfarin -  Pharmacist Dosing Inpatient   Does not apply [place]: [phone] Office: [phone] 08/13/2017

## 2017-08-13 NOTE — Progress Notes (Signed)
Progress Note  Patient Name: Michael [name] Date of Encounter: 08/13/2017  Primary Cardiologist: Dr. Harrington Challenger  Subjective   Pt feeling better today. Chest pain is improved, he is eating better. No more episodes of rapid afib.    Inpatient Medications    Scheduled Meds: . allopurinol  150 mg Oral Daily  . atorvastatin  20 mg Oral Daily  . calcium acetate  667 mg Oral TID WC  . diclofenac sodium  4 g Topical QID  . insulin aspart  0-5 Units Subcutaneous QHS  . insulin aspart  0-9 Units Subcutaneous TID WC  . lidocaine  1 patch Transdermal Q24H  . metoprolol succinate  25 mg Oral BID  . midodrine  10 mg Oral TID WC  . pantoprazole  40 mg Oral Daily  . Warfarin - Pharmacist Dosing Inpatient   Does not apply q1800   Continuous Infusions: . sodium chloride    . sodium chloride    . amiodarone 30 mg/hr (08/13/17 0649)  .  ceFAZolin (ANCEF) IV     Followed by  .  ceFAZolin (ANCEF) IV    . ferric gluconate (FERRLECIT/NULECIT) IV     PRN Meds: sodium chloride, sodium chloride, alteplase, heparin, heparin, HYDROcodone-acetaminophen, lidocaine (PF), lidocaine-prilocaine, morphine injection, pentafluoroprop-tetrafluoroeth, senna-docusate   Vital Signs    Vitals:   08/13/17 0300 08/13/17 0404 08/13/17 0500 08/13/17 0807  BP: 104/66 96/62 102/63 111/74  Pulse: 76 80 78 92  Resp: 11 15 11 12   Temp:  98 F (36.7 C)  98.9 F (37.2 C)  TempSrc:  Oral  Oral  SpO2: 100% 99% 100% 97%  Weight:  164 lb (74.4 kg)    Height:        Intake/Output Summary (Last 24 hours) at 08/13/17 0855 Last data filed at 08/13/17 0649  Gross per 24 hour  Intake          1282.79 ml  Output             1595 ml  Net          -312.21 ml   Filed Weights   08/12/17 0745 08/12/17 1051 08/13/17 0404  Weight: 166 lb 14.2 oz (75.7 kg) 164 lb 0.4 oz (74.4 kg) 164 lb (74.4 kg)    Telemetry    Atrial fibrillation w/ rate generally 90s-110s,  Personally Reviewed  ECG    09/26 Atrial fibrillation  at 130 bpm with RBBB, LAD, nonspecific T wave changes laterally- Personally Reviewed  Physical Exam   GEN: Acute distress.   Neck: JVD 9 cm Cardiac: irreg irreg, 3/6 sem, HR controlled now. Chest: + chest wall tenderness.  Respiratory: few rales bases, good air exchange GI: soft, NT, non-distended MS: R digits cyanotic distally. Edema of the L wrist.  Erythma significantly improved.  Edema of R fingers. R 2nd digit exquisitely tender to touch.  2+ radial pulses. No deformity. Neuro:  Nonfocal  Psych: Normal affect  Redness and swelling of the left wrist area  Labs    Chemistry Recent Labs Lab 08/10/17 1400  08/11/17 1513 08/12/17 0554 08/12/17 0758 08/13/17 0546  NA 133*  < > 133* 134*  --  135  K 3.9  < > 4.1 3.8  --  4.1  CL 97*  < > 98* 97*  --  101  CO2 26  < > 27 26  --  25  GLUCOSE 93  < > 68 126*  --  86  BUN 15  < > 21* 24*  --  15  CREATININE 3.53*  < > 3.90* 4.01*  --  3.39*  CALCIUM 8.7*  < > 8.6* 8.3*  --  8.5*  PROT 6.8  --   --   --  6.4*  --   ALBUMIN 3.2*  --  3.0* 2.7* 2.7*  --   AST 30  --   --   --  24  --   ALT 13*  --   --   --  12*  --   ALKPHOS 142*  --   --   --  150*  --   BILITOT 2.8*  --   --   --  1.9*  --   GFRNONAA 18*  < > 16* 15*  --  19*  GFRAA 21*  < > 18* 18*  --  22*  ANIONGAP 10  < > 8 11  --  9  < > = values in this interval not displayed.   Hematology  Recent Labs Lab 08/11/17 1513 08/12/17 0758 08/13/17 0546  WBC 5.5 6.3 5.5  RBC 4.14* 3.67* 3.86*  HGB 11.2* 10.0* 10.4*  HCT 35.5* 30.9* 32.9*  MCV 85.7 84.2 85.2  MCH 27.1 27.2 26.9  MCHC 31.5 32.4 31.6  RDW 19.1* 19.5* 19.8*  PLT 99* 135* 130*    Cardiac Enzymes  Recent Labs Lab 08/10/17 1812 08/11/17 0004 08/11/17 0642  TROPONINI 0.05* 0.04* 0.03*     Recent Labs Lab 08/10/17 1414  TROPIPOC 0.03     Radiology    Dg Ribs Bilateral  Result Date: 08/12/2017 CLINICAL DATA:  Bilateral anterior inferior ribcage pain for the past 3 days with no known  injury. EXAM: BILATERAL RIBS - 3+ VIEW COMPARISON:  Chest x-ray of August 10, 1609 FINDINGS: Metallic BBs have been placed over the symptomatic lower anterior ribs. The ribs are subjectively adequately mineralized. There is no acute or healing fracture. There is no lytic nor blastic lesion. The patient has undergone previous median sternotomy. There is a dual-lumen dialysis catheter in place with the tip in the proximal to mid SVC. There is a small amount of pleural fluid versus pleural thickening inferiorly and laterally, bilaterally. IMPRESSION: There is no acute or significant chronic abnormality of the ribs. There are small amounts of pleural fluid versus pleural thickening bilaterally. Electronically Signed   By: David  Martinique M.D.   On: 08/12/2017 15:59    Cardiac Studies   07/09/17 TEE  Study Conclusions - Left ventricle: The cavity size was normal. Wall thickness was normal. Systolic function was normal. The estimated ejection fraction was in the range of 55% to 60%. Wall motion was normal; there were no regional wall motion abnormalities. No evidence of thrombus. - Aortic valve: There was no stenosis. There was moderate regurgitation. - Aorta: Normal caliber aorta with grade III plaque in descending thoracic aorta. - Mitral valve: Status post mitral valve replacement with bioprosthetic valve. Trivial MR. Mean gradient 6 mmHg across valve, does not appear to have significant stenosis. - Left atrium: The atrium was severely dilated. The LA appendage appears to have been ligated. No LA thrombus. - Right ventricle: D-shaped interventricular septum suggestive of RV pressure/volume overload. The RV was severely dilated with moderately decreased systolic function. - Right atrium: The atrium was moderately dilated. - Atrial septum: No defect or patent foramen ovale was identified. 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 Peak  RV-RA gradient 25 mmHg.  Impressions: - Proceed to DCCV. Successful cardioversion. No cardiac  source of emboli was indentified.   RT HEART CATH 06/29/17   Conclusion  1. Elevated right and left heart filling pressures, more prominent RV failure.  2. Pulmonary venous hypertension.  3. Cardiac output vigorous on milrinone.    Right Heart  Right Heart Pressures RHC Procedural Findings: Hemodynamics (mmHg) RA mean 27 RV 63/8, mean 20 PA 58/28, mean 40 PCWP mean 28  Oxygen saturations: PA 69% AO 96%  Cardiac Output (Fick) 9.82  Cardiac Index (Fick) 4.96 PVR 1.2 WU     TTE 06/01/17 Study Conclusions - Left ventricle: The cavity size was normal. There was mild concentric hypertrophy. Systolic function was normal. The estimated ejection fraction was in the range of 50% to 55%. Wall motion was normal; there were no regional wall motion abnormalities. - Ventricular septum: The contour showed diastolic flattening. - Aortic valve: Transvalvular velocity was minimally increased. There was no stenosis. There was mild regurgitation. Peak velocity (S): 201 cm/s. - Mitral valve: A bioprosthesis was present and functioning normally. Mean gradient (D): 6 mm Hg. Valve area by pressure half-time: 1.93 cm^2. - Left atrium: The atrium was severely dilated. - Right ventricle: The cavity size was moderately dilated. Wall thickness was normal. Systolic function was moderately reduced. - Right atrium: The atrium was moderately dilated. - Tricuspid valve: There was severe regurgitation. - Pulmonary arteries: Systolic pressure was moderately increased. PA peak pressure: 54 mm Hg (S).   Patient Profile     56 y.o. male with a hx of MV disorder, with MVR with bioprosthetic MV, 2012 at [facility] in [place], also tricuspid valve repair., CAD, PAF, ESRD on HD since recent admit earlier this month, OSA, DM, HTN, HLD, CHF, pulmonary HTN, anemia who had cardiac arrest vs  seizure while getting dialysis.  Assessment & Plan    # Atrial fibrillation with RVR:  - Pt went into afib, RVR during HD 09/24 as an outpt - BP dropped>>pt became unresponsive - this was like what happened in HD 09/26 - on warfarin and metoprolol. However, metoprolol has not been given at all due to hypotension (SBP usually 90s)   # Cardiac arrest:  - presumed PEA.  Not on telemetry during initial episode in HD pta. No shock advised by AED.   - 09/26 during HD, pt became unresponsive w/ SBP 70s and HR 140s in afib - The second portion of his Lexiscan Myoview was stopped early 9/25 2/2 hypotension. Will try to complete today    # Chest pain: - Probably 2nd CPR. - pain much worse with movement  - rib X rays did not show acute fx, suspect soft tissue injury from CPR.   # MVR: # Tricuspid repair; See echo report, stable.  # Chronic diastolic heart failure: # Severe pulmonary hypertension:  # RV failure; Volume mgt w/ HD Continue metoprolol as BP will tolerate, on midodrine.   # Hyperlipidemia: on statin  For questions or updates, please contact [facility] Please consult [email or web address] for contact info under Cardiology/STEMI.      Jonetta Speak, PA-C  08/13/2017, 8:55 AM

## 2017-08-13 NOTE — Progress Notes (Signed)
  Date: 08/13/2017  Patient name: Michael [name]  Medical record number: [id number]  Date of birth: January 06, 1961   I have seen and evaluated this patient and I have discussed the plan of care with the house staff. Please see Dr. Tally Joe note for complete details. I concur with his findings.    Awaiting Myoview tmw.  Will be NPO tonight.   Sid Falcon, MD 08/13/2017, 3:48 PM

## 2017-08-13 NOTE — Consult Note (Signed)
Consultation Note Date: 08/13/2017   Patient Name: Michael [name]  DOB: 12-25-1960  MRN: [id number]  Age / Sex: 56 y.o., male  PCP: [name], MD Referring Physician: Sid Falcon, MD  Reason for Consultation: Establishing goals of care  HPI/Patient Profile: 56 y.o. male  with past medical history of ESRD on HD, A. Fib, heart failure (EF 15%), s/p MV and tricuspid valve replacements, gout, DM, recently discharged home after prolonged hospitalization for acute on chronic aki r/t to advanced heart failure during which HD was initiated. He admitted on 08/10/2017 following loss of consciousness, likely PEA arrest during an outpatient dialysis session. He received approx. 2 mins of CPR, no shock, with resumption of circulation.  On 9/26 during dialysis he had another episode of unresponsiveness during hemodialysis- with hypotension, chest pain, and a. Fib with RVR in 140's.  Myoview scan was attempted for eval of cardiac arrest- was half completed due to hypotension. Chest xray shows possible developing pneumonia and patient is covered empirically with antibiotics. Patient has several comorbidities and has not been tolerating HD sessions. Noted per Dr. Charlestine Massed  Palliative medicine consulted for goals/limits in care conversations.   Clinical Assessment and Goals of Care: Met with patient at bedside. I attempted to call his daughter Ilona Sorrel, to arrange family meeting, but her mailbox was full.  Patient tells me he feels bad all over, but feels better than he did yesterday.  When I ask him if he can tell me why he is in the hospital, he first tells me he doesn't know what happened. But after further conversation he is able to elicit the events above that lead to his hospitalization.  We discuss his current health status and his understanding of his disease state and trajectory. He says the doctors have explained  it all to him and have called his daughter and explained it all to her. His expectation is that he will have "another test" tomorrow on his heart. He also states he wants to try dialysis again to see if he can tolerate it. We discuss the fact that if he cannot tolerate dialysis he is facing end of life.  Advance directives and EOL wishes were discussed. Patient notes his daughter would be his primary decision maker if he were unable to make decisions. He is unsure about his preferences regarding artificial life support, artifical feeding and hydration. He has not given much thought to those things.  We discuss what is important to him. At this point, all life prolonging measures are very important to him. His daughter is in school and he wants to at least see her graduate in one year. We discuss code status. For now he wishes to proceed with the second half of the myoscan, and attempt dialysis again. He wishes to remain at full code status. He agrees to palliative medicine follow-up following outcome of myoscan and next attempted dialysis session.   Primary Decision Maker PATIENT    SUMMARY OF RECOMMENDATIONS -Full scope care for now -PMT will have follow up  meeting after next HD session and will try and contact patient's daughter  Code Status/Advance Care Planning:  Full code  Prognosis:    Unable to determine  Discharge Planning: To Be Determined  Primary Diagnoses: Present on Admission: . Syncope and collapse . Permanent atrial fibrillation ([place])   I have reviewed the medical record, interviewed the patient and family, and examined the patient. The following aspects are pertinent.  Past Medical History:  Diagnosis Date  . Anemia of chronic disease   . Asthma    said he does not know  . Chronic kidney disease (CKD), stage IV (severe) ([place])   . Chronic systolic CHF (congestive heart failure) (HCC)    a. prior EF normal; b. Echo 10/16: Mild LVH, EF 30-35%, mitral valve  bioprosthesis present without evidence of stenosis or regurgitation, severe LAE, mild RVE with mild to moderately reduced RVSF, moderate RAE  . Coronary artery disease   . Diabetes mellitus type 2 in obese ([place])   . Diabetes mellitus without complication ([place])   . Dyspnea   . GERD (gastroesophageal reflux disease)   . H/O tricuspid valve repair 2012  . History of echocardiogram    Echo at [facility] in [facility] 4/12: mod LVH, EF 60-65%, mod LAE, tissue MVR ok, mod TR, mod pulmo HTN with RVSP 48 mmHg  . Hypertension   . Obstructive sleep apnea   . Paroxysmal atrial fibrillation (HCC)    s/p Maze procedure at time of MVR in 2011  . Pulmonary HTN ([place])   . S/P mitral valve replacement with porcine valve 2012   Social History   Social History  . Marital status: Single    Spouse name: N/A  . Number of children: N/A  . Years of education: N/A   Occupational History  .      on disability   Social History Main Topics  . Smoking status: Former Smoker    Packs/day: 1.00    Types: Cigarettes  . Smokeless tobacco: Former Systems developer     Comment: said he is not a regular smoker  . Alcohol use No  . Drug use: No  . Sexual activity: Not Currently   Other Topics Concern  . None   Social History Narrative  . None   Family History  Problem Relation Age of Onset  . Heart attack Neg Hx    Scheduled Meds: . allopurinol  150 mg Oral Daily  . atorvastatin  20 mg Oral Daily  . calcium acetate  667 mg Oral TID WC  . diclofenac sodium  4 g Topical QID  . insulin aspart  0-5 Units Subcutaneous QHS  . insulin aspart  0-9 Units Subcutaneous TID WC  . lidocaine  1 patch Transdermal Q24H  . metoprolol succinate  25 mg Oral BID  . midodrine  10 mg Oral TID WC  . pantoprazole  40 mg Oral Daily  . predniSONE  10 mg Oral Q breakfast  . Warfarin - Pharmacist Dosing Inpatient   Does not apply q1800   Continuous Infusions: . sodium chloride    . sodium chloride    .  ceFAZolin (ANCEF) IV     Followed  by  .  ceFAZolin (ANCEF) IV    . ferric gluconate (FERRLECIT/NULECIT) IV     PRN Meds:.sodium chloride, sodium chloride, alteplase, heparin, HYDROcodone-acetaminophen, lidocaine (PF), lidocaine-prilocaine, morphine injection, pentafluoroprop-tetrafluoroeth, senna-docusate Medications Prior to Admission:  Prior to Admission medications   Medication Sig Start Date End Date Taking? Authorizing Provider  ACCU-CHEK  SOFTCLIX LANCETS lancets USE AS INSTRUCTED 3 TIMES DAILY 08/04/17  Yes [name], MD  acetaminophen-codeine (TYLENOL #3) 300-30 MG tablet Take 1 tablet by mouth every 12 (twelve) hours as needed for moderate pain. 08/04/17  Yes [name], MD  allopurinol (ZYLOPRIM) 300 MG tablet Take 0.5 tablets (150 mg total) by mouth daily. 07/28/17  Yes Shirley Friar, PA-C  atorvastatin (LIPITOR) 20 MG tablet Take 1 tablet (20 mg total) by mouth daily. 07/28/17  Yes Shirley Friar, PA-C  Blood Glucose Monitoring Suppl (ACCU-CHEK AVIVA PLUS) w/Device KIT USE AS INSTRUCTED 3 TIMES DAILY 08/04/17  Yes [name], MD  calcium acetate (PHOSLO) 667 MG capsule Take 1 capsule (667 mg total) by mouth 3 (three) times daily with meals. 07/27/17  Yes Shirley Friar, PA-C  diclofenac sodium (VOLTAREN) 1 % GEL Apply 4 g topically 4 (four) times daily.   Yes [provider]  docusate sodium (COLACE) 100 MG capsule Take 1 capsule (100 mg total) by mouth 2 (two) times daily. 07/27/17  Yes Shirley Friar, PA-C  glucose blood (ACCU-CHEK AVIVA PLUS) test strip USE 3 TIMES DAILY AS DIRECTED. 08/04/17  Yes [name], MD  metoprolol succinate (TOPROL-XL) 25 MG 24 hr tablet Take 1 tablet (25 mg total) by mouth 2 (two) times daily. 07/27/17  Yes Shirley Friar, PA-C  midodrine (PROAMATINE) 5 MG tablet Take 1 tablet (5 mg total) by mouth 3 (three) times daily with meals. 07/27/17  Yes Shirley Friar, PA-C  omeprazole (PRILOSEC) 20 MG capsule Take 1 capsule (20 mg  total) by mouth daily. 03/03/17  Yes [name], MD  OXYGEN Inhale 2 L into the lungs daily.   Yes [provider]  tiZANidine (ZANAFLEX) 4 MG tablet Take 1 tablet (4 mg total) by mouth every 8 (eight) hours as needed for muscle spasms. 08/04/17  Yes [name], MD  warfarin (COUMADIN) 2.5 MG tablet Take 1 tablet (2.5 mg total) by mouth daily at 6 PM. Or as directed by coumadin clinic 07/27/17  Yes Tillery, Satira Mccallum, PA-C  acetaminophen (TYLENOL) 650 MG CR tablet Take 650 mg by mouth every 6 (six) hours as needed for pain.    [provider]  colchicine 0.6 MG tablet Take 1.2 mg by mouth daily as needed for pain. 06/02/17   [provider]  Darbepoetin Alfa (ARANESP) 200 MCG/0.4ML SOSY injection Inject 0.4 mLs (200 mcg total) into the skin every Saturday at 6 PM. 08/01/17   Tillery, Satira Mccallum, PA-C  folic acid (FOLVITE) 1 MG tablet TAKE 1 TABLET BY MOUTH DAILY. 04/20/17   [name], MD  hydrOXYzine (ATARAX/VISTARIL) 10 MG tablet Take 1 tablet (10 mg total) by mouth every 6 (six) hours as needed for itching or anxiety (if itching unrelieved by benadryl). 07/27/17   Shirley Friar, PA-C  Insulin Detemir (LEVEMIR FLEXTOUCH) 100 UNIT/ML Pen Inject 10 Units into the skin daily at 10 pm. 10/16/16   [name], MD   No Known Allergies Review of Systems  Constitutional: Positive for activity change, appetite change and fatigue.  Respiratory: Positive for chest tightness. Negative for shortness of breath.     Physical Exam  Constitutional: He appears well-developed and well-nourished. No distress.  Cardiovascular: Normal rate and regular rhythm.   Pulmonary/Chest: Effort normal and breath sounds normal.  Musculoskeletal: Normal range of motion.  Skin: Skin is warm and dry.  Nursing note and vitals reviewed.   Vital Signs: BP 113/69 (BP Location: Right Arm)  Pulse 84   Temp 98.4 F (36.9 C) (Oral)   Resp 12   Ht '5\' 5"'  (1.651 m)   Wt 74.4 kg  (164 lb)   SpO2 100%   BMI 27.29 kg/m  Pain Assessment: No/denies pain   Pain Score: 0-No pain   SpO2: SpO2: 100 % O2 Device:SpO2: 100 % O2 Flow Rate: .O2 Flow Rate (L/min): 6 L/min  IO: Intake/output summary:  Intake/Output Summary (Last 24 hours) at 08/13/17 1405 Last data filed at 08/13/17 1100  Gross per 24 hour  Intake          1580.79 ml  Output              200 ml  Net          1380.79 ml    LBM: Last BM Date: 08/10/17 Baseline Weight: Weight: 76.2 kg (167 lb 14.4 oz) Most recent weight: Weight: 74.4 kg (164 lb)     Palliative Assessment/Data: PPS: 30%     Thank you for this consult. Palliative medicine will continue to follow and assist as needed.   Time In: 1300 Time Out: 1430 Time Total: 90 mins Greater than 50%  of this time was spent counseling and coordinating care related to the above assessment and plan.  Signed by: Mariana Kaufman, AGNP-C Palliative Medicine    Please contact Palliative Medicine Team phone at [phone] for questions and concerns.  For individual provider: See Shea Evans

## 2017-08-13 NOTE — Progress Notes (Signed)
[facility] Progress Note   Dialysis Orders: MWFS4hrs, BFR 400, DFR 800, EDW 75kg, 2K/2.25Ca, TDC  Heparin 4200 units IV venofer 50mg  IV qwk - last 9/19 Mircera 271mcg IV q2wks - last 9/19  Assessment/Plan: 1. Suspected PEA arrest- occurred at HD, required 2 min of CPR. Cardiology consulted and started workup. 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  -Plan to try 2nd part Myoview today, 1st completed and stopped b/c hypotension. Results pending 2. Atrial Fibrillation with RVR -New onset, currently stable, followed by Cardiology -Warfarin started, Pharm consulted 3. 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 +CP & abdominal cramping on treatment. BP drop and brief LOC during. Continue to monitor closely. HD tomorrow, orders written. 4. 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 Cont binders, follow 6. Lung opacities- ?pneumonia vs atelectasis 7. Vascular access-   -edema and erythema at anastomosis site, possible phelibitis .   -Duplex shows no evidence of thrombosis in LUE 8. Nutrition: renal diet, Nepro TID, renavite qd 9. Hypertension: low bp on midodrine and increased to 10mg  tid 10. Disposition- poor overall prognosis due to his ongoing issues with hypotension and poor cardiac function.   Recommend Palliative Care Consult to help set goals/limits of care.  They were involved at his last hospitalization.  Ria Comment Penninger PA-C [facility] 08/13/2017,9:35 AM  LOS: 3 days   Subjective:   Patient is sitting up in bed eating breakfast. States he is feeling better, Chest and abdominal pain improved and SOB/dyspnea improved with O2. Denies N/V/D, edema, and dizziness.   Objective Vitals:   08/13/17 0300 08/13/17 0404 08/13/17 0500 08/13/17 0807  BP: 104/66 96/62 102/63 111/74  Pulse: 76 80 78 92  Resp: 11 15 11 12   Temp:  98 F (36.7 C)  98.9 F (37.2 C)  TempSrc:   Oral  Oral  SpO2: 100% 99% 100% 97%  Weight:  74.4 kg (164 lb)    Height:       Physical Exam General:NAD, sitting upright in bed  Heart: irregularly irregular RR, +murmur  Lungs: CTA, no wheeze or rhonchi. On 6L O2 via [place]. Abdomen: soft, +tenderness throughout, no guarding Extremities: 1+edema in LE, R>L. LUE peeling, +swelling,  Dialysis Access:    Intake/Output Summary (Last 24 hours) at 08/13/17 0944 Last data filed at 08/13/17 0649  Gross per 24 hour  Intake          1282.79 ml  Output             1595 ml  Net          -312.21 ml    Additional Objective Labs: Basic Metabolic Panel:  Recent Labs Lab 08/11/17 1513 08/12/17 0554 08/13/17 0546  NA 133* 134* 135  K 4.1 3.8 4.1  CL 98* 97* 101  CO2 27 26 25   GLUCOSE 68 126* 86  BUN 21* 24* 15  CREATININE 3.90* 4.01* 3.39*  CALCIUM 8.6* 8.3* 8.5*  PHOS 3.9 3.8  --    Liver Function Tests:  Recent Labs Lab 08/10/17 1400 08/11/17 1513 08/12/17 0554 08/12/17 0758  AST 30  --   --  24  ALT 13*  --   --  12*  ALKPHOS 142*  --   --  150*  BILITOT 2.8*  --   --  1.9*  PROT 6.8  --   --  6.4*  ALBUMIN 3.2* 3.0* 2.7* 2.7*  CBC:  Recent Labs Lab 08/10/17 1400  08/11/17 0642 08/11/17 1513 08/12/17 0758 08/13/17 0546  WBC 7.6  --  5.5 5.5 6.3 5.5  HGB 10.2*  < > 9.9* 11.2* 10.0* 10.4*  HCT 32.4*  < > 32.6* 35.5* 30.9* 32.9*  MCV 85.5  --  86.2 85.7 84.2 85.2  PLT 80*  --  95* 99* 135* 130*  < > = values in this interval not displayed. Blood Culture    Component Value Date/Time   SDES BLOOD RIGHT HAND 08/10/2017 1615   SPECREQUEST Blood Culture adequate volume 08/10/2017 1615   CULT NO GROWTH 2 DAYS 08/10/2017 1615   REPTSTATUS PENDING 08/10/2017 1615    Cardiac Enzymes:  Recent Labs Lab 08/10/17 1812 08/11/17 0004 08/11/17 0642  TROPONINI 0.05* 0.04* 0.03*   CBG:  Recent Labs Lab 08/11/17 2105 08/12/17 1140 08/12/17 1635 08/12/17 1958 08/13/17 0716  GLUCAP 129* 82 88 129* 87    Iron Studies: No results for input(s): IRON, TIBC, TRANSFERRIN, FERRITIN in the last 72 hours. Lab Results  Component Value Date   INR 3.40 08/13/2017   INR 2.86 08/12/2017   INR 2.10 08/11/2017   Studies/Results: Dg Ribs Bilateral  Result Date: 08/12/2017 CLINICAL DATA:  Bilateral anterior inferior ribcage pain for the past 3 days with no known injury. EXAM: BILATERAL RIBS - 3+ VIEW COMPARISON:  Chest x-ray of August 11, 7587 FINDINGS: Metallic BBs have been placed over the symptomatic lower anterior ribs. The ribs are subjectively adequately mineralized. There is no acute or healing fracture. There is no lytic nor blastic lesion. The patient has undergone previous median sternotomy. There is a dual-lumen dialysis catheter in place with the tip in the proximal to mid SVC. There is a small amount of pleural fluid versus pleural thickening inferiorly and laterally, bilaterally. IMPRESSION: There is no acute or significant chronic abnormality of the ribs. There are small amounts of pleural fluid versus pleural thickening bilaterally. Electronically Signed   By: David  Martinique M.D.   On: 08/12/2017 15:59   Medications: . sodium chloride    . sodium chloride    . amiodarone 30 mg/hr (08/13/17 0649)  .  ceFAZolin (ANCEF) IV     Followed by  .  ceFAZolin (ANCEF) IV    . ferric gluconate (FERRLECIT/NULECIT) IV     . allopurinol  150 mg Oral Daily  . atorvastatin  20 mg Oral Daily  . calcium acetate  667 mg Oral TID WC  . diclofenac sodium  4 g Topical QID  . insulin aspart  0-5 Units Subcutaneous QHS  . insulin aspart  0-9 Units Subcutaneous TID WC  . lidocaine  1 patch Transdermal Q24H  . metoprolol succinate  25 mg Oral BID  . midodrine  10 mg Oral TID WC  . pantoprazole  40 mg Oral Daily  . predniSONE  10 mg Oral Q breakfast  . Warfarin - Pharmacist Dosing Inpatient   Does not apply q1800    I have seen and examined this patient and agree with plan and assessment in the above  note with renal recommendations/intervention highlighted.  Feels better and appreciate Cardiology input.  For myoview today.  Will try 3K HD bath and decrease bfr to 300 to see if this will help prevent afib with rvr.  ?role for amiodarone per cardiology.  However if he continues to have issues with HD will then need to transition to comfort care.  I recommend re-consulting palliative care to help set goals/limits of  care Broadus John A Tadarius Maland,MD 08/13/2017 11:24 AM

## 2017-08-13 NOTE — Progress Notes (Signed)
Subjective: Patient was seen sitting up comfortably in his bed this morning. Continues to have post CPR chest pain, but states this is improved this morning. His left arm pain is improved. His right index finger continue to be pain for and is mor swollen, he states it is similar to gout he has had previously.  Objective:  Vital signs in last 24 hours: Vitals:   08/13/17 0404 08/13/17 0500 08/13/17 0807 08/13/17 0940  BP: 96/62 102/63 111/74 109/80  Pulse: 80 78 92 84  Resp: '15 11 12   ' Temp: 98 F (36.7 C)  98.9 F (37.2 C)   TempSrc: Oral  Oral   SpO2: 99% 100% 97%   Weight: 164 lb (74.4 kg)     Height:       Physical Exam  Constitutional: He is oriented to person, place, and time. He appears well-developed and well-nourished.  HENT:  Head: Normocephalic and atraumatic.  Eyes: EOM are normal. Right eye exhibits no discharge. Left eye exhibits no discharge.  Cardiovascular: Normal rate.   Irregularly irregular rhythm  Pulmonary/Chest: Effort normal and breath sounds normal. No respiratory distress. He has no wheezes.  Abdominal: Soft. Bowel sounds are normal. He exhibits no distension. There is no tenderness.  Musculoskeletal:  Chest tender Mild edema of LUE Left 2-5th digits cool R 2nd digit tender, edematous and erythematous No LE edema  Neurological: He is alert and oriented to person, place, and time.  Skin: Skin is warm and dry.    Assessment/Plan:  Mr. Conroy is a 56 yo M with a history of DM, ESRD on HD MWFSat, HTN, Chronic A. 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 Sudden cardiac arrest was supected as he was reportedly apneic and pulseless. CPR was performed and patient spontaneously regained  consciousness within minutes. Exact etiology unclear. Differential includes cardiac arrest , seizure, pulmonary embolism, or possibly A. Fib with RVR and PEA similar to brief episode experienced as an inpatinet. V/Q cancelled due to conflict with Myoview study, will consider following Myoview. Troponin trend 0.05, 0.04, 0.03. Repeat EKG 9/25, unchanged to prior. Patient also has history of chronic hypotension.  - Appreciate cardiology recommendations  - Midodrine 30m TID (for Hypotension) - Cardiac monitoring - Echocardiogram - Myoview - Voltaren gel - Norco 557m 1-2 q4h PRN  Left Upper extremity Pain Code Sepsis, Cullulitis: Patient presented with hypotension and elevated Lactate 2.64. CXR showed heterogeneous opacities at right lung base (likely aspiration pneumonitis from event). Vancomycin and cefepime started in the ED. Afebrile, WBC 7.6. Blood Culture NGTD. Patient complained of 2 days of increasing left forearm swelling and pain. Patient had left antecubital fistula placed on 8/27. There was concern for clotted fistula. Thrill is very subtle due to patients chronic hypotension, but present. Suspect cellulitis. Vancomycin and Cefepime discontinued and cefazolin initiated. Left upper extremity doppler negative. Clinically improving on current antibiotics. Fingers do remain cool to touch. - Cefazolin 1g now, then 1 g q24h - Heat pads for hands   Right 4th digit pain and swelling: Suspect gout flair. Patient states this is similar to previous flairs. - Prednisone 1040maily  Chronic A. Fib:  On Warfarin, Subtherapeutic on admission. Episode of RVR in dialysis 9/26. Rate controlled on current regimen 9/27. - Appreciate cardiology  reccomendations - Metoprolol succinate 97m BID - Amiodarone 340mhr continuous - Warfarin (per pharmacy)  DM: On 10 units Levemir qhs at home, currently has run out.  - SSI  ESRD on HD MWFS:  - Nephrology on board - Phoslo 66748mID AC - Nulecit 125m71m NS  MWF (HD) - Midodrine 5mg 34m AC, for hypotension - Renal function panel in AM  Elevated Alk Phos: History of previous elevation earlier this year, with resolution. History of Abdominal US sh[place]ing GB wall thickening. Suggestion of liver cirrhosis based on previous imaging of his abdomen. Hepatitis panel in 2017 was normal. Once he has improved will consider repeat abdominal ultrasound for further evaluation.  Right Lower Extremity Rash: Elevated T. Bili on presentation. Considering vasculitis also in the setting of concurrent LUE symptoms. T. Bili improved from presentation 2.8 -> 1.8 (9/26). ESR 35. Rash has improved.   FEN: NPO DVT Prophylaxis: On Warfarin Code Status: Full Code  Dispo: Anticipated discharge in approximately 1-3 day(s).   MelviNeva Seat9/27/2018, 10:49 AM Pager: [phone]

## 2017-08-13 NOTE — Progress Notes (Signed)
  Echocardiogram 2D Echocardiogram has been performed.  Bobbye Charleston 08/13/2017, 1:27 PM

## 2017-08-13 NOTE — Plan of Care (Signed)
Problem: Activity: Goal: Risk for activity intolerance will decrease Outcome: Progressing Verbalizes generalized weakness. Transfers to bedside commode with one assist

## 2017-08-14 LAB — CBC
HCT: 32.1 % — ABNORMAL LOW (ref 39.0–52.0)
Hemoglobin: 10.2 g/dL — ABNORMAL LOW (ref 13.0–17.0)
MCH: 26.6 pg (ref 26.0–34.0)
MCHC: 31.8 g/dL (ref 30.0–36.0)
MCV: 83.8 fL (ref 78.0–100.0)
PLATELETS: 150 10*3/uL (ref 150–400)
RBC: 3.83 MIL/uL — AB (ref 4.22–5.81)
RDW: 18.7 % — AB (ref 11.5–15.5)
WBC: 6.9 10*3/uL (ref 4.0–10.5)

## 2017-08-14 LAB — NM MYOCAR MULTI W/SPECT W/WALL MOTION / EF
CHL CUP MPHR: 163 {beats}/min
CHL RATE OF PERCEIVED EXERTION: 0
CSEPEDS: 0 s
CSEPHR: 59 %
CSEPPHR: 97 {beats}/min
Estimated workload: 1 METS
Exercise duration (min): 0 min
Rest HR: 70 {beats}/min

## 2017-08-14 LAB — RENAL FUNCTION PANEL
Albumin: 2.7 g/dL — ABNORMAL LOW (ref 3.5–5.0)
Anion gap: 6 (ref 5–15)
BUN: 23 mg/dL — AB (ref 6–20)
CHLORIDE: 97 mmol/L — AB (ref 101–111)
CO2: 28 mmol/L (ref 22–32)
CREATININE: 4.29 mg/dL — AB (ref 0.61–1.24)
Calcium: 8.6 mg/dL — ABNORMAL LOW (ref 8.9–10.3)
GFR, EST AFRICAN AMERICAN: 16 mL/min — AB (ref >60)
GFR, EST NON AFRICAN AMERICAN: 14 mL/min — AB (ref >60)
Glucose, Bld: 107 mg/dL — ABNORMAL HIGH (ref 65–99)
PHOSPHORUS: 4.4 mg/dL (ref 2.5–4.6)
POTASSIUM: 4.3 mmol/L (ref 3.5–5.1)
Sodium: 131 mmol/L — ABNORMAL LOW (ref 135–145)

## 2017-08-14 LAB — PROTIME-INR
INR: 3.99
Prothrombin Time: 38.6 seconds — ABNORMAL HIGH (ref 11.4–15.2)

## 2017-08-14 LAB — GLUCOSE, CAPILLARY
GLUCOSE-CAPILLARY: 66 mg/dL (ref 65–99)
GLUCOSE-CAPILLARY: 100 mg/dL — AB (ref 65–99)
GLUCOSE-CAPILLARY: 119 mg/dL — AB (ref 65–99)

## 2017-08-14 LAB — HEPATITIS B SURFACE ANTIGEN: Hepatitis B Surface Ag: NEGATIVE

## 2017-08-14 MED ORDER — HEPARIN SODIUM (PORCINE) 1000 UNIT/ML DIALYSIS: 20.0000 [IU]/kg | INTRAMUSCULAR | Status: DC | PRN

## 2017-08-14 MED ORDER — AMINOPHYLLINE 25 MG/ML IV (NUC MED)
75.0000 mg | Freq: Once | INTRAVENOUS | Status: AC
  Administered 2017-08-14: 75 mg via INTRAVENOUS

## 2017-08-14 MED ORDER — SODIUM CHLORIDE 0.9 % IV SOLN: 100.0000 mL | INTRAVENOUS | Status: DC | PRN

## 2017-08-14 MED ORDER — CEFAZOLIN SODIUM-DEXTROSE 1-4 GM/50ML-% IV SOLN
1.0000 g | INTRAVENOUS | Status: DC
  Administered 2017-08-14 – 2017-08-16 (×3): 1 g via INTRAVENOUS
  Filled 2017-08-14 (×3): qty 50

## 2017-08-14 MED ORDER — REGADENOSON 0.4 MG/5ML IV SOLN
0.4000 mg | Freq: Once | INTRAVENOUS | Status: AC
  Administered 2017-08-14: 0.4 mg via INTRAVENOUS
  Filled 2017-08-14: qty 5

## 2017-08-14 MED ORDER — TECHNETIUM TC 99M TETROFOSMIN IV KIT
30.0000 | PACK | Freq: Once | INTRAVENOUS | Status: AC | PRN
  Administered 2017-08-14: 30 via INTRAVENOUS

## 2017-08-14 MED ORDER — REGADENOSON 0.4 MG/5ML IV SOLN
INTRAVENOUS | Status: AC
  Filled 2017-08-14: qty 5

## 2017-08-14 MED ORDER — AMINOPHYLLINE 25 MG/ML IV SOLN
INTRAVENOUS | Status: AC
  Filled 2017-08-14: qty 10

## 2017-08-14 MED ORDER — ACETAMINOPHEN 325 MG PO TABS
ORAL_TABLET | ORAL | Status: AC
  Filled 2017-08-14: qty 2

## 2017-08-14 MED ORDER — SODIUM CHLORIDE 0.9 % IV BOLUS (SEPSIS)
250.0000 mL | Freq: Once | INTRAVENOUS | Status: AC
  Administered 2017-08-14: 250 mL via INTRAVENOUS

## 2017-08-14 NOTE — Procedures (Signed)
I was present at this dialysis session. I have reviewed the session itself and made appropriate changes.   Filed Weights   08/13/17 0404 08/14/17 0523 08/14/17 0654  Weight: 74.4 kg (164 lb) 80.7 kg (178 lb) 76.6 kg (168 lb 14 oz)     Recent Labs Lab 08/14/17 0715  NA 131*  K 4.3  CL 97*  CO2 28  GLUCOSE 107*  BUN 23*  CREATININE 4.29*  CALCIUM 8.6*  PHOS 4.4     Recent Labs Lab 08/12/17 0758 08/13/17 0546 08/14/17 0715  WBC 6.3 5.5 6.9  HGB 10.0* 10.4* 10.2*  HCT 30.9* 32.9* 32.1*  MCV 84.2 85.2 83.8  PLT 135* 130* 150    Scheduled Meds: . allopurinol  150 mg Oral Daily  . atorvastatin  20 mg Oral Daily  . calcium acetate  667 mg Oral TID WC  . diclofenac sodium  4 g Topical QID  . insulin aspart  0-5 Units Subcutaneous QHS  . insulin aspart  0-9 Units Subcutaneous TID WC  . lidocaine  1 patch Transdermal Q24H  . metoprolol succinate  25 mg Oral BID  . midodrine  10 mg Oral TID WC  . pantoprazole  40 mg Oral Daily  . predniSONE  10 mg Oral Q breakfast  . Warfarin - Pharmacist Dosing Inpatient   Does not apply q1800   Continuous Infusions: . sodium chloride    . sodium chloride    . sodium chloride    . sodium chloride    .  ceFAZolin (ANCEF) IV     Followed by  .  ceFAZolin (ANCEF) IV Stopped (08/13/17 2344)  . ferric gluconate (FERRLECIT/NULECIT) IV     PRN Meds:.sodium chloride, sodium chloride, sodium chloride, sodium chloride, alteplase, heparin, heparin, HYDROcodone-acetaminophen, lidocaine (PF), lidocaine-prilocaine, morphine injection, pentafluoroprop-tetrafluoroeth, senna-docusate    Assessment/Plan: 1. A fib with RVR- rate currentlty 72 after metoprolol started. bp 114/69.  Also used 4K bath and slowed bfr to 300 2. ESRD cont with MWFSat HD due to cmp and hypotension 3. Anemia of CKD stage 5- Hgb stable 4. Vascular access- LUE AVF +T/B, edema of lower arm improving.  VVS following, no DVT and felt to be due to phlebitis.  5. Disposition-  appreciate Palliative Care input and await discussion with daughter.    [name],  MD 08/14/2017, 8:22 AM

## 2017-08-14 NOTE — Progress Notes (Signed)
Pt became hypotensive with lexiscan and BP to 66 systolic, gave aminophylline with improvement.  Discussed with Dr. Oval Linsey and gave 250 cc NS.  BP improved to 92 systolic.

## 2017-08-14 NOTE — Progress Notes (Signed)
Subjective: Patient was seen in HD laying comfortably in his bed this morning.Continues to have some post CPR chest pain, but states it continues to improve and he is able to move around in his bed without pain now. His left arm pain and swelling has resolved. His right index finger pain  Is reduced and his swelling has gone down. He spoke with palliative care yesterday concerning his goals of care and what could happen if he is unable to tolerate dialysis.  Objective:  Vital signs in last 24 hours: Vitals:   08/14/17 1313 08/14/17 1316 08/14/17 1317 08/14/17 1330  BP: (!) '89/60 90/60 91/71 ' 106/81  Pulse:      Resp:      Temp:      TempSrc:      SpO2:      Weight:      Height:       Physical Exam  Constitutional: He is oriented to person, place, and time. He appears well-developed and well-nourished.  HENT:  Head: Normocephalic and atraumatic.  Eyes: EOM are normal. Right eye exhibits no discharge. Left eye exhibits no discharge.  Cardiovascular: Normal rate.   Irregularly irregular rhythm  Pulmonary/Chest: Effort normal and breath sounds normal. No respiratory distress. He has no wheezes.  Abdominal: Soft. Bowel sounds are normal. He exhibits no distension. There is no tenderness.  Musculoskeletal:  Chest tender R 2nd digit tender (improved), mildly edematous No LE edema  Neurological: He is alert and oriented to person, place, and time.  Skin: Skin is warm and dry.    Assessment/Plan:  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 Sudden cardiac arrest was supected as he was reportedly apneic and pulseless. CPR was performed and patient  spontaneously regained consciousness within minutes. Exact etiology unclear. Differential includes cardiac arrest , seizure, pulmonary embolism, or possibly A. Fib with RVR and PEA similar to brief episode experienced as an inpatinet. V/Q cancelled due to conflict with Myoview study, will consider following Myoview. Troponin trend 0.05, 0.04, 0.03. Repeat EKG 9/25, unchanged to prior. Patient also has history of chronic hypotension. Echocardiogram showed EF 50-55%, D-shaped interventricular septum suggests RV pressure/volume overload, there was severe regurgitation (s/p tricuspid valve repair).  - Appreciate cardiology recommendations  - Midodrine 68m TID (for Hypotension) - Cardiac monitoring - Myoview - Voltaren gel - Norco 512m 1-2 q4h PRN  Left Upper extremity Pain Code Sepsis, Cullulitis: Patient presented with hypotension and elevated Lactate 2.64. CXR showed heterogeneous opacities at right lung base (likely aspiration pneumonitis from event). Vancomycin and cefepime started in the ED. Afebrile, WBC 7.6. Blood Culture NGTD. Patient complained of 2 days of increasing left forearm swelling and pain. Patient had left antecubital fistula placed on 8/27. There was concern for clotted fistula. Thrill is very subtle due to patients chronic hypotension, but present. Suspect cellulitis. Vancomycin and Cefepime discontinued and cefazolin initiated. Left upper extremity doppler negative. Clinically improving on current antibiotics. Fingers warmer today. - Cefazolin 1g now, then 1 g q24h (started antibiotics on 9/24)  Right 4th digit pain and swelling: Suspect gout flair. Patient states this is similar to previous flairs. Improving on prednisone. - Prednisone 1079m  Daily  Chronic A. Fib:  On Warfarin, Subtherapeutic on admission. Episode of RVR in dialysis 9/26. Amiodarone initiated following this and discontinued on 9/27. - Appreciate cardiology reccomendations - Metoprolol succinate 80m BID -  Warfarin (per pharmacy)  DM: On 10 units Levemir qhs at home, currently has run out.  - SSI  ESRD on HD MWFS:  - Nephrology on board - Phoslo 6625mTID AC - Nulecit 12524mn NS MWF (HD) - Midodrine 5mg58mD AC, for hypotension - Renal function panel in AM  Elevated Alk Phos: History of previous elevation earlier this year, with resolution. History of Abdominal US [place] GB wall thickening. Suggestion of liver cirrhosis based on previous imaging of his abdomen. Hepatitis panel in 2017 was normal. Once he has improved will consider repeat abdominal ultrasound for further evaluation.  Right Lower Extremity Rash: Elevated T. Bili on presentation. Considering vasculitis also in the setting of concurrent LUE symptoms. T. Bili improved from presentation 2.8 -> 1.8 (9/26). ESR 35. Rash has improved.   FEN: NPO DVT Prophylaxis: On Warfarin Code Status: Full Code  Dispo: Anticipated discharge in approximately 1-3 day(s).   MelvNeva Seat 08/14/2017, 1:56 PM Pager: [phone]

## 2017-08-14 NOTE — Progress Notes (Addendum)
Pt's nuc study is positive for ischemia, discussed with Dr. Oval Linsey and will plan cath for Monday 08/20/17.    I discussed cath with pt but he would like someone to discuss with his daughter.  You must call and leave message and she will call back.  Due to lateness of hour I will ask team member to do this tomorrow.    The patient understands that risks included but are not limited to stroke (1 in 1000), death (1 in 58), kidney failure [usually temporary] (1 in 500), bleeding (1 in 200), allergic reaction [possibly serious] (1 in 200).   Pt wants his daughter to approve first.

## 2017-08-14 NOTE — Progress Notes (Signed)
  Date: 08/14/2017  Patient name: Michael [name]  Medical record number: [id number]  Date of birth: August 06, 1961   I have seen and evaluated this patient and I have discussed the plan of care with the house staff. Please see Dr. Tally Joe note for complete details. I concur with his findings with the following additions/corrections:   Called by Radiology for [facility] results - reversibility in mid inferior wall.  EF > 50%.  Will ask the team to discuss further with cardiology.      Sid Falcon, MD 08/14/2017, 3:41 PM

## 2017-08-14 NOTE — Progress Notes (Signed)
Progress Note  Patient Name: Michael [name] Date of Encounter: 08/14/2017  Primary Cardiologist: Dr. Harrington Challenger  Subjective   Patient seen in HD.  Feeling much better today.  No chest pain.  Breathing stable.  Denies palpitations.    Inpatient Medications    Scheduled Meds: . allopurinol  150 mg Oral Daily  . atorvastatin  20 mg Oral Daily  . calcium acetate  667 mg Oral TID WC  . diclofenac sodium  4 g Topical QID  . insulin aspart  0-5 Units Subcutaneous QHS  . insulin aspart  0-9 Units Subcutaneous TID WC  . lidocaine  1 patch Transdermal Q24H  . metoprolol succinate  25 mg Oral BID  . midodrine  10 mg Oral TID WC  . pantoprazole  40 mg Oral Daily  . predniSONE  10 mg Oral Q breakfast  . Warfarin - Pharmacist Dosing Inpatient   Does not apply q1800   Continuous Infusions: . sodium chloride    . sodium chloride    . sodium chloride    . sodium chloride    .  ceFAZolin (ANCEF) IV    . ferric gluconate (FERRLECIT/NULECIT) IV     PRN Meds: sodium chloride, sodium chloride, sodium chloride, sodium chloride, alteplase, heparin, heparin, HYDROcodone-acetaminophen, lidocaine (PF), lidocaine-prilocaine, morphine injection, pentafluoroprop-tetrafluoroeth, senna-docusate   Vital Signs    Vitals:   08/14/17 0830 08/14/17 0900 08/14/17 0930 08/14/17 1000  BP: 104/65 104/62 100/62 115/65  Pulse: 72 71 77 65  Resp: 13 13 11 10   Temp:      TempSrc:      SpO2: 100% 100% 100%   Weight:      Height:        Intake/Output Summary (Last 24 hours) at 08/14/17 1050 Last data filed at 08/14/17 0306  Gross per 24 hour  Intake              468 ml  Output              151 ml  Net              317 ml   Filed Weights   08/13/17 0404 08/14/17 0523 08/14/17 0654  Weight: 74.4 kg (164 lb) 80.7 kg (178 lb) 76.6 kg (168 lb 14 oz)    Telemetry    Atrial fibrillation in the 70's-130s - Personally Reviewed  ECG    Atrial fibrillation at 130 bpm with RBBB, LAD, nonspecific T wave  changes laterally- Personally Reviewed  Physical Exam   GEN: Well-appearing.  No acute distress. Neck: No JVD Cardiac: irregularly irregular rhythm.  3/6 systolic murmur  Respiratory: Clear to auscultation bilaterally. GI: Soft, nontender, non-distended  MS: L arm erythma significantly improved.  Edema and tenderness of R 2nd digit improved. Neuro:  Nonfocal  Psych: Normal affect   Labs    Chemistry Recent Labs Lab 08/10/17 1400  08/12/17 0554 08/12/17 0758 08/13/17 0546 08/14/17 0715  NA 133*  < > 134*  --  135 131*  K 3.9  < > 3.8  --  4.1 4.3  CL 97*  < > 97*  --  101 97*  CO2 26  < > 26  --  25 28  GLUCOSE 93  < > 126*  --  86 107*  BUN 15  < > 24*  --  15 23*  CREATININE 3.53*  < > 4.01*  --  3.39* 4.29*  CALCIUM 8.7*  < > 8.3*  --  8.5* 8.6*  PROT 6.8  --   --  6.4*  --   --   ALBUMIN 3.2*  < > 2.7* 2.7*  --  2.7*  AST 30  --   --  24  --   --   ALT 13*  --   --  12*  --   --   ALKPHOS 142*  --   --  150*  --   --   BILITOT 2.8*  --   --  1.9*  --   --   GFRNONAA 18*  < > 15*  --  19* 14*  GFRAA 21*  < > 18*  --  22* 16*  ANIONGAP 10  < > 11  --  9 6  < > = values in this interval not displayed.   Hematology  Recent Labs Lab 08/12/17 0758 08/13/17 0546 08/14/17 0715  WBC 6.3 5.5 6.9  RBC 3.67* 3.86* 3.83*  HGB 10.0* 10.4* 10.2*  HCT 30.9* 32.9* 32.1*  MCV 84.2 85.2 83.8  MCH 27.2 26.9 26.6  MCHC 32.4 31.6 31.8  RDW 19.5* 19.8* 18.7*  PLT 135* 130* 150    Cardiac Enzymes  Recent Labs Lab 08/10/17 1812 08/11/17 0004 08/11/17 0642  TROPONINI 0.05* 0.04* 0.03*     Recent Labs Lab 08/10/17 1414  TROPIPOC 0.03     BNPNo results for input(s): BNP, PROBNP in the last 168 hours.   DDimer No results for input(s): DDIMER in the last 168 hours.   Radiology    Dg Ribs Bilateral  Result Date: 08/12/2017 CLINICAL DATA:  Bilateral anterior inferior ribcage pain for the past 3 days with no known injury. EXAM: BILATERAL RIBS - 3+ VIEW  COMPARISON:  Chest x-ray of August 10, 6760 FINDINGS: Metallic BBs have been placed over the symptomatic lower anterior ribs. The ribs are subjectively adequately mineralized. There is no acute or healing fracture. There is no lytic nor blastic lesion. The patient has undergone previous median sternotomy. There is a dual-lumen dialysis catheter in place with the tip in the proximal to mid SVC. There is a small amount of pleural fluid versus pleural thickening inferiorly and laterally, bilaterally. IMPRESSION: There is no acute or significant chronic abnormality of the ribs. There are small amounts of pleural fluid versus pleural thickening bilaterally. Electronically Signed   By: David  Martinique M.D.   On: 08/12/2017 15:59    Cardiac Studies    Echo 08/13/17: Study Conclusions  - Left ventricle: The cavity size was normal. Wall thickness was   increased in a pattern of mild LVH. Indeterminant diastolic   function (atrial fibrillation). Systolic function was low normal   to mildly reduced. The estimated ejection fraction was in the   range of 50% to 55%. Although no diagnostic regional wall motion   abnormality was identified, this possibility cannot be completely   excluded on the basis of this study. - Ventricular septum: D-shaped interventricular septum suggests RV   pressure/volume overload. - Aortic valve: There was no stenosis. There was mild   regurgitation. - Aorta: Mildly dilated ascending aorta. Ascending aortic diameter:   39 mm (S). - Mitral valve: There is a bioprosthetic mitral valve. Mildly   elevated mean gradient across the bioprosthetic mitral valve,   doubt significant stenosis. There was no significant   regurgitation. Mean gradient (D): 8 mm Hg. - Left atrium: The atrium was severely dilated. - Right ventricle: The cavity size was severely dilated. Systolic   function was moderately reduced. - Right  atrium: The atrium was moderately dilated. - Tricuspid valve: S/p  tricuspid valve repair. The valve does not   appear to fully coapt. There was severe regurgitation. Peak RV-RA   gradient (S): 48 mm Hg. - Pulmonary arteries: PA peak pressure: 63 mm Hg (S). - Systemic veins: IVC measured 3.3 cm with < 50% respirophasic   variation, suggesting RA pressure 15 mmHg. - Pericardium, extracardiac: A trivial pericardial effusion was   identified.  Impressions:  - The patient was in atrial fibrillation. Normal LV size with mild   LV hypertrophy. EF 50-55%, low normal to mildly reduced. D-shaped   interventricular septum suggestive of RV pressure/volume   overload. Severely dilated RV with moderately decreased systolic   function. Bioprosthetic mitral valve with mildly elevated mean   gradient, no regurgitation noted. Mild aortic insufficiency. S/p   TV repair but there is severe TR. Moderate pulmonary   hypertension.   RT HEART CATH 06/29/17   Conclusion  1. Elevated right and left heart filling pressures, more prominent RV failure.  2. Pulmonary venous hypertension.  3. Cardiac output vigorous on milrinone.    Right Heart  Right Heart Pressures RHC Procedural Findings: Hemodynamics (mmHg) RA mean 27 RV 63/8, mean 20 PA 58/28, mean 40 PCWP mean 28  Oxygen saturations: PA 69% AO 96%  Cardiac Output (Fick) 9.82  Cardiac Index (Fick) 4.96 PVR 1.2 WU     TTE 06/01/17 Study Conclusions - Left ventricle: The cavity size was normal. There was mild concentric hypertrophy. Systolic function was normal. The estimated ejection fraction was in the range of 50% to 55%. Wall motion was normal; there were no regional wall motion abnormalities. - Ventricular septum: The contour showed diastolic flattening. - Aortic valve: Transvalvular velocity was minimally increased. There was no stenosis. There was mild regurgitation. Peak velocity (S): 201 cm/s. - Mitral valve: A bioprosthesis was present and functioning normally. Mean  gradient (D): 6 mm Hg. Valve area by pressure half-time: 1.93 cm^2. - Left atrium: The atrium was severely dilated. - Right ventricle: The cavity size was moderately dilated. Wall thickness was normal. Systolic function was moderately reduced. - Right atrium: The atrium was moderately dilated. - Tricuspid valve: There was severe regurgitation. - Pulmonary arteries: Systolic pressure was moderately increased. PA peak pressure: 54 mm Hg (S).   Patient Profile     56 y.o. male with a hx of MV disorder, with MVR with bioprosthetic MV, 2012 at [facility] in [place], also tricuspid valve repair., CAD, PAF, ESRD on HD since recent admit earlier this month, OSA, DM, HTN, HLD, CHF, pulmonary HTN, anemia who had cardiac arrest vs seizure while getting dialysis.  Assessment & Plan    # Atrial fibrillation with RVR: Michael [name] remains in atrial fibirllation.  Rate well-controlled.  Continue warfarin and metoprolol.    # Cardiac arrest: Presumed PEA.  No telemetry available for the initial episode in HD prior to admission.  He will go for day 2 of his [place] today.  # Chest pain: Suspect this is 2/2 CPR.  Symptoms are now much better.  No rib fracture on xray.  Going for [employer] as above.   # MVR: # Tricuspid repair; Mean mitral gradient increased from 3mmHg to 8 mmHg.  It doesn't appear that there is significant stenosis visually.   # Chronic diastolic heart failure: # Severe pulmonary hypertension:  # RV failure:  # Severe TR:  Continue metoprolol and volume management with HD as BP tolerates.  Continue midodrine.  BP better since increasing midodrine this admission.   # Hyperlipidemia: continue statin    For questions or updates, please contact [place] Please consult [email or web address] for contact info under Cardiology/STEMI.      Signed, Skeet Latch, MD  08/14/2017, 10:50 AM

## 2017-08-14 NOTE — Care Management Note (Addendum)
Case Management Note  Patient Details  Name: Michael [name] MRN: [id number] Date of Birth: 1961/03/14  Subjective/Objective:  Pt presented for syncope and collapse @ HD-CPR performed.  Hx of DM & ESRD- MWF Schedule. Treating for Code Sepsis- Cellulits. On IV Ancef. Plan for 2 day Myoview.  Pt is from home alone. Pt states he has family support that checks in on him from time to time. Pt has DME 4 pronged cane. He uses the [employer] for transportation. Pt will benefit from PT/OT consult.                    Action/Plan: CM will continue to monitor for disposition needs.   Expected Discharge Date:                  Expected Discharge Plan:  [place]  In-House Referral:     Discharge planning Services  CM Consult  Post Acute Care Choice:   Home with Home Health, Resumption of Svcs/ PTA Provider.  Choice offered to:   Patient  DME Arranged:   N/A DME Agency:   N/A  HH Arranged:   RN, PT [place] Agency:   Alvis Lemmings  Status of Service: Completed  If discussed at [place] of Stay Meetings, dates discussed:  08-25-17  Additional Comments: 08-25-17 [place], RN,BSN [phone] [facility] with Alvis Lemmings for HHRN/ PT Services. Pt will need resumption orders once stable for d/c. Hopefully pt will be stable for d/c post HD 08-26-17. No further needs from CM at this time.  Bethena Roys, RN 08/14/2017, 4:20 PM

## 2017-08-14 NOTE — Progress Notes (Signed)
ANTICOAGULATION CONSULT NOTE  Pharmacy Consult for warfarin Indication: atrial fibrillation  No Known Allergies  Vital Signs: Temp: 98.1 F (36.7 C) (09/28 0654) Temp Source: Oral (09/28 0654) BP: 115/65 (09/28 1000) Pulse Rate: 65 (09/28 1000)  Labs:  Recent Labs  08/12/17 0554 08/12/17 0758 08/13/17 0546 08/14/17 0446 08/14/17 0715  HGB  --  10.0* 10.4*  --  10.2*  HCT  --  30.9* 32.9*  --  32.1*  PLT  --  135* 130*  --  150  LABPROT 29.8*  --  34.1* 38.6*  --   INR 2.86  --  3.40 3.99  --   CREATININE 4.01*  --  3.39*  --  4.29*    Estimated Creatinine Clearance: 18.1 mL/min (A) (by C-G formula based on SCr of 4.29 mg/dL (H)).   Assessment: 56yom with suspected cardiac arrest on HD center on coumadin pta for afib/bioprosthetic MVR. INR on admit subtherapeutic at 1.79. Venous doppler is negative.  INR remains elevated at 3.99- trending up despite not receiving doses for the past 2 evenings.  AST/ALT normal, alk phos up at 150, and Tbili up at 1.9 on 9/26. Amiodarone has been discontinued. No other drug-drug interactions noted.  Home dose: 2.30m daily - last taken 9/23  Goal of Therapy:  INR 2-3 Monitor platelets by anticoagulation protocol: Yes   Plan:  -Hold warfarin again today -Daily PT/INR  Shawnta Schlegel D. Teddy Rebstock, PharmD, BCPS Clinical Pharmacist Pager: [phone] Phone for 08/14/2017 until 3:30pm: [phone] If after 3:30pm, please call main pharmacy at [phone] 08/14/2017 10:37 AM

## 2017-08-14 NOTE — Progress Notes (Signed)
[place] NOTE  Pharmacy Consult to start IV Heparin once INR <2.0;     Warfarin consult  Indication: atrial fibrillation  No Known Allergies  Vital Signs: Temp: 97.5 F (36.4 C) (09/28 1628) Temp Source: Oral (09/28 1628) BP: 105/78 (09/28 1628) Pulse Rate: 72 (09/28 1104)  Labs:  Recent Labs  08/12/17 0554  08/12/17 0758 08/13/17 0546 08/14/17 0446 08/14/17 0715  HGB  --   < > 10.0* 10.4*  --  10.2*  HCT  --   --  30.9* 32.9*  --  32.1*  PLT  --   --  135* 130*  --  150  LABPROT 29.8*  --   --  34.1* 38.6*  --   INR 2.86  --   --  3.40 3.99  --   CREATININE 4.01*  --   --  3.39*  --  4.29*  < > = values in this interval not displayed.  Estimated Creatinine Clearance: 18.1 mL/min (A) (by C-G formula based on SCr of 4.29 mg/dL (H)).   Assessment: 56yom with suspected cardiac arrest on HD center on coumadin pta for afib/bioprosthetic MVR. Pharmacy already consulted previously for Warfarin management.  Warfarin dose held today due to INR  supratherapeutic at 3.99. No vitamin k given.  Tonight pharmacy consulted to start IV heparin once INR <2.0.    Home dose: 2.5mg  daily - last taken 9/23  Goal of Therapy:  INR 2-3 Monitor platelets by anticoagulation protocol: Yes   Plan:  No heparin IV needed at this time.  F/u daily INR  Warfarin held today due to supratherapeutic INR   Nicole Cella, RPh Clinical Pharmacist 08/14/2017 6:17 PM

## 2017-08-15 ENCOUNTER — Other Ambulatory Visit: Payer: Self-pay

## 2017-08-15 DIAGNOSIS — M7989 Other specified soft tissue disorders: Secondary | ICD-10-CM

## 2017-08-15 DIAGNOSIS — Z8739 Personal history of other diseases of the musculoskeletal system and connective tissue: Secondary | ICD-10-CM

## 2017-08-15 DIAGNOSIS — M79632 Pain in left forearm: Secondary | ICD-10-CM

## 2017-08-15 DIAGNOSIS — Z7901 Long term (current) use of anticoagulants: Secondary | ICD-10-CM

## 2017-08-15 DIAGNOSIS — K219 Gastro-esophageal reflux disease without esophagitis: Secondary | ICD-10-CM

## 2017-08-15 DIAGNOSIS — I998 Other disorder of circulatory system: Secondary | ICD-10-CM

## 2017-08-15 DIAGNOSIS — I9589 Other hypotension: Secondary | ICD-10-CM

## 2017-08-15 DIAGNOSIS — K829 Disease of gallbladder, unspecified: Secondary | ICD-10-CM

## 2017-08-15 DIAGNOSIS — I071 Rheumatic tricuspid insufficiency: Secondary | ICD-10-CM

## 2017-08-15 DIAGNOSIS — Z79899 Other long term (current) drug therapy: Secondary | ICD-10-CM

## 2017-08-15 DIAGNOSIS — R21 Rash and other nonspecific skin eruption: Secondary | ICD-10-CM

## 2017-08-15 DIAGNOSIS — I130 Hypertensive heart and chronic kidney disease with heart failure and stage 1 through stage 4 chronic kidney disease, or unspecified chronic kidney disease: Secondary | ICD-10-CM

## 2017-08-15 DIAGNOSIS — M79644 Pain in right finger(s): Secondary | ICD-10-CM

## 2017-08-15 DIAGNOSIS — Z794 Long term (current) use of insulin: Secondary | ICD-10-CM

## 2017-08-15 LAB — CULTURE, BLOOD (ROUTINE X 2)
CULTURE: NO GROWTH
CULTURE: NO GROWTH
SPECIAL REQUESTS: ADEQUATE
Special Requests: ADEQUATE

## 2017-08-15 LAB — GLUCOSE, CAPILLARY
GLUCOSE-CAPILLARY: 89 mg/dL (ref 65–99)
GLUCOSE-CAPILLARY: 117 mg/dL — AB (ref 65–99)
Glucose-Capillary: 134 mg/dL — ABNORMAL HIGH (ref 65–99)
Glucose-Capillary: 128 mg/dL — ABNORMAL HIGH (ref 65–99)

## 2017-08-15 LAB — PROTIME-INR
INR: 3.18
PROTHROMBIN TIME: 32.3 s — AB (ref 11.4–15.2)

## 2017-08-15 NOTE — Progress Notes (Signed)
Subjective:  No complaints of shortness of breath or anginal chest pain today. Still some chest wall soreness.  Was hypotensive yesterday with Lexiscan that improved with IV fluids as well as Aminophyllin.  Plan is for catheterization on Monday because of abnormal stress test.  Objective:  Vital Signs in the last 24 hours: BP 112/78 (BP Location: Right Arm)   Pulse 95   Temp 98.4 F (36.9 C) (Oral)   Resp 17   Ht 5\' 5"  (1.651 m)   Wt 78.8 kg (173 lb 11.2 oz)   SpO2 95%   BMI 28.91 kg/m   Physical Exam: Pleasant Asian male in no acute distress Lungs:  Clear Cardiac:  Irregular irregular rhythm, normal S1 and S2, no S3, 2 to 3/6 systolic murmur at the apex Abdomen:  Soft, nontender, no masses Extremities:  No edema present, dialysis fistula present  Intake/Output from previous day: 09/28 0701 - 09/29 0700 In: 50 [IV Piggyback:50] Out: 1575 [Urine:75]  Weight Filed Weights   08/14/17 0654 08/14/17 1104 08/15/17 0444  Weight: 76.6 kg (168 lb 14 oz) 75.2 kg (165 lb 12.6 oz) 78.8 kg (173 lb 11.2 oz)    Lab Results: Basic Metabolic Panel:  Recent Labs  08/13/17 0546 08/14/17 0715  NA 135 131*  K 4.1 4.3  CL 101 97*  CO2 25 28  GLUCOSE 86 107*  BUN 15 23*  CREATININE 3.39* 4.29*   CBC:  Recent Labs  08/13/17 0546 08/14/17 0715  WBC 5.5 6.9  HGB 10.4* 10.2*  HCT 32.9* 32.1*  MCV 85.2 83.8  PLT 130* 150   Telemetry: Personally reviewed Atrial fibrillation with controlled response  Assessment/Plan:  1.  Previous cardiac arrest 2.  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  Severe pulmonary hypertension 4.  Hypotension  Recommendations:  tried to talk to daughter about catheterization but she did not answer the phone.  Hold anticoagulation in anticipation of catheterization and cover with heparin if needed.Kerry Hough  MD [facility] Cardiology  08/15/2017, 9:01 AM

## 2017-08-15 NOTE — Progress Notes (Signed)
Patient ID: Jayjay Littles, male   DOB: 1961/06/07, 56 y.o.   MRN: [id number]  [facility] Progress Note    Subjective:   Feels better today   Objective:   BP 105/72   Pulse 92   Temp 98.4 F (36.9 C) (Oral)   Resp 17   Ht 5\' 5"  (1.651 m)   Wt 78.8 kg (173 lb 11.2 oz)   SpO2 95%   BMI 28.91 kg/m   Intake/Output: I/O last 3 completed shifts: In: 100 [IV Piggyback:100] Out: 1726 [Urine:225; Other:1500; Stool:1]   Intake/Output this shift:  No intake/output data recorded. Weight change: -5.54 kg (-12 lb 3.4 oz)  Physical Exam: Gen:NAD CVS: IRR IRR no rub Resp:cta IRC:VELFYB Ext: no edema, LUE AVF +T/B, mild edema of forearm  Labs: BMET  Recent Labs Lab 08/10/17 1400 08/10/17 1416 08/11/17 0642 08/11/17 1513 08/12/17 0554 08/12/17 0758 08/13/17 0546 08/14/17 0715  NA 133* 135 135 133* 134*  --  135 131*  K 3.9 3.9 4.0 4.1 3.8  --  4.1 4.3  CL 97* 94* 99* 98* 97*  --  101 97*  CO2 26  --  27 27 26   --  25 28  GLUCOSE 93 89 97 68 126*  --  86 107*  BUN 15 18 20  21* 24*  --  15 23*  CREATININE 3.53* 3.40* 3.98* 3.90* 4.01*  --  3.39* 4.29*  ALBUMIN 3.2*  --   --  3.0* 2.7* 2.7*  --  2.7*  CALCIUM 8.7*  --  8.4* 8.6* 8.3*  --  8.5* 8.6*  PHOS  --   --   --  3.9 3.8  --   --  4.4   CBC  Recent Labs Lab 08/11/17 1513 08/12/17 0758 08/13/17 0546 08/14/17 0715  WBC 5.5 6.3 5.5 6.9  HGB 11.2* 10.0* 10.4* 10.2*  HCT 35.5* 30.9* 32.9* 32.1*  MCV 85.7 84.2 85.2 83.8  PLT 99* 135* 130* 150    @IMGRELPRIORS @ Medications:    . allopurinol  150 mg Oral Daily  . atorvastatin  20 mg Oral Daily  . calcium acetate  667 mg Oral TID WC  . diclofenac sodium  4 g Topical QID  . insulin aspart  0-5 Units Subcutaneous QHS  . insulin aspart  0-9 Units Subcutaneous TID WC  . lidocaine  1 patch Transdermal Q24H  . metoprolol succinate  25 mg Oral BID  . midodrine  10 mg Oral TID WC  . pantoprazole  40 mg Oral Daily  . predniSONE  10 mg Oral Q  breakfast   Dialysis Orders: MWFS4hrs, BFR 400, DFR 800, EDW 75kg, 2K/2.25Ca, TDC  Heparin 4200 units IV venofer 50mg  IV qwk - last 9/19 Mircera 277mcg IV q2wks - last 9/19  Assessment/ Plan:   1. A fib with RVR- rate currentlty 72 after metoprolol started. bp 114/69.  Also used 4K bath and slowed bfr to 300 which helped with HD 2. + nuclear myoview for ischemia- plan for cardiac cath on Monday 3. ESRD cont with MWFSat HD due to cmp and hypotension.  Hold off on extra treatment for now as he appears euvolemic and has had issues with Afib/RVR.   4. Anemia of CKD stage 5- Hgb stable 5. Vascular access- LUE AVF +T/B, edema of lower arm improving.  VVS following, no DVT and felt to be due to phlebitis.  6. Disposition- appreciate Palliative Care input and await discussion with daughter.    [name], MD  [place] Pager [phone] 08/15/2017, 10:52 AM

## 2017-08-15 NOTE — Progress Notes (Signed)
  Date: 08/15/2017  Patient name: Michael [name]  Medical record number: [id number]  Date of birth: 1961/07/28   I have seen and evaluated this patient and I have discussed the plan of care with the house staff. Please see their note for complete details. I concur with their findings with the following additions/corrections: For cath on 10/1.  Bartholomew Crews, MD 08/15/2017, 2:31 PM

## 2017-08-15 NOTE — Progress Notes (Addendum)
Subjective: Patient was seen laying comfortably in his bed this morning. His post CPR chest pain has greatly improved. He states he has been able to back and forth to his bedside chair comfortably. His right index finger pain continues to improve and he has increased range of motion. The results of his recent Myoview were discussed as was the plan to have a cardiac cath performed on Monday.  Objective:  Vital signs in last 24 hours: Vitals:   08/14/17 1946 08/15/17 0444 08/15/17 0721 08/15/17 1011  BP:  112/78  105/72  Pulse:  95  92  Resp:  17    Temp: 97.8 F (36.6 C) 98.2 F (36.8 C) 98.4 F (36.9 C)   TempSrc: Oral Oral Oral   SpO2:  95%    Weight:  173 lb 11.2 oz (78.8 kg)    Height:       Physical Exam  Constitutional: He is oriented to person, place, and time. He appears well-developed and well-nourished.  HENT:  Head: Normocephalic and atraumatic.  Eyes: EOM are normal. Right eye exhibits no discharge. Left eye exhibits no discharge.  Cardiovascular: Normal rate.   Irregularly irregular rhythm  Pulmonary/Chest: Effort normal and breath sounds normal. No respiratory distress. He has no wheezes.  Abdominal: Soft. Bowel sounds are normal. He exhibits no distension. There is no tenderness.  Musculoskeletal:  Chest tenderness R 2nd digit mildly tender (improved) No LE edema  Neurological: He is alert and oriented to person, place, and time.  Skin: Skin is warm and dry.    Assessment/Plan:  Michael [name] is a 56 yo M with a history of DM, ESRD on HD MWFSat, HTN, Chronic A. Fib, HFpEF, GERD, S/P Mitral Valve replacement and Tricuspid valve repair, recently admitted from 8/9 - 9/10 for Acute on Chronic HF and AKI who presents following an episode of syncope and collapse at dialysis.  Syncope and Collapse: Patient presented following loss of consciousness during dialysis. Sudden cardiac arrest was supected as he was reportedly apneic and pulseless. CPR was performed and  patient spontaneously regained consciousness within minutes. Exact etiology unclear. Differential includes cardiac arrest , seizure, pulmonary embolism, or possibly A. Fib with RVR and PEA similar to brief episode experienced as an inpatinet. V/Q cancelled due to conflict with Myoview study, will consider following Myoview. Troponin trend 0.05, 0.04, 0.03. Repeat EKG 9/25, unchanged to prior. Patient also has history of chronic hypotension. Echocardiogram showed EF 50-55%, D-shaped interventricular septum suggests RV pressure/volume overload, there was severe regurgitation (s/p tricuspid valve repair). Myoview showed reversible ischemia, intermediate risk. - Appreciate cardiology recommendations  - Midodrine 73m TID (for Hypotension) - Cardiac monitoring - Cardia cath on Monday - D/C Warfarin in anticipation of cath; Heparin per pharmacy - Voltaren gel - Norco 561m 1-2 q4h PRN  Left Upper extremity Pain Code Sepsis, Cullulitis: Patient presented with hypotension and elevated Lactate 2.64. CXR showed heterogeneous opacities at right lung base (likely aspiration pneumonitis from event). Vancomycin and cefepime started in the ED. Afebrile, WBC 7.6. Blood Culture NGTD. Patient complained of 2 days of increasing left forearm swelling and pain. Patient had left antecubital fistula placed on 8/27. There was concern for clotted fistula. Thrill is very subtle due to patients chronic hypotension, but present. Suspect cellulitis. Vancomycin and Cefepime discontinued and cefazolin initiated. Left upper extremity doppler negative. Clinically improving on current antibiotics.  - Cefazolin 1g now, then 1 g q24h (for 7-10d total, started antibiotics on 9/24)  Right 4th digit pain and swelling:  Suspect gout flair. Patient states this is similar to previous flairs. Improving on prednisone. - Prednisone 53m Daily -- ADDENDUM, 5 day course starting 9/27  Chronic A. Fib:  On Warfarin, Subtherapeutic on admission.  Episode of RVR in dialysis 9/26. Amiodarone initiated following this and discontinued on 9/27.  - Appreciate cardiology reccomendations - Metoprolol succinate 268mBID - Will D/C warfarin in anticipation of cardiac on Monday - Heparin per pharmacy  DM: On 10 units Levemir qhs at home, currently has run out.  - SSI  ESRD on HD MWFS:  - Nephrology on board - Phoslo 66755mID AC - Nulecit 125m45m NS MWF (HD) - Midodrine 5mg 66m AC, for hypotension - Renal function panel in AM  Elevated Alk Phos: History of previous elevation earlier this year, with resolution. History of Abdominal US shKoreaing GB wall thickening. Suggestion of liver cirrhosis based on previous imaging of his abdomen. Hepatitis panel in 2017 was normal. Once he has improved will consider repeat abdominal ultrasound for further evaluation. Will continue to monitor.  Right Lower Extremity Rash: Elevated T. Bili on presentation. Considering vasculitis also in the setting of concurrent LUE symptoms. T. Bili improved from presentation 2.8 -> 1.8 (9/26). ESR 35. Rash has improved. Will continue to monitor.  FEN: Renal, Carb modified DVT Prophylaxis: Heparin Code Status: Full Code  Dispo: Anticipated discharge in approximately 2-4 day(s).   Michael Seat9/29/2018, 10:12 AM Pager: [phone]

## 2017-08-16 DIAGNOSIS — I509 Heart failure, unspecified: Secondary | ICD-10-CM | POA: Diagnosis not present

## 2017-08-16 DIAGNOSIS — Z992 Dependence on renal dialysis: Secondary | ICD-10-CM | POA: Diagnosis not present

## 2017-08-16 DIAGNOSIS — N186 End stage renal disease: Secondary | ICD-10-CM | POA: Diagnosis not present

## 2017-08-16 LAB — CBC
HCT: 34.8 % — ABNORMAL LOW (ref 39.0–52.0)
Hemoglobin: 11.1 g/dL — ABNORMAL LOW (ref 13.0–17.0)
MCH: 26.6 pg (ref 26.0–34.0)
MCHC: 31.9 g/dL (ref 30.0–36.0)
MCV: 83.5 fL (ref 78.0–100.0)
PLATELETS: 243 10*3/uL (ref 150–400)
RBC: 4.17 MIL/uL — ABNORMAL LOW (ref 4.22–5.81)
RDW: 18.8 % — AB (ref 11.5–15.5)
WBC: 5.9 10*3/uL (ref 4.0–10.5)

## 2017-08-16 LAB — GLUCOSE, CAPILLARY
GLUCOSE-CAPILLARY: 86 mg/dL (ref 65–99)
GLUCOSE-CAPILLARY: 143 mg/dL — AB (ref 65–99)
GLUCOSE-CAPILLARY: 125 mg/dL — AB (ref 65–99)
Glucose-Capillary: 102 mg/dL — ABNORMAL HIGH (ref 65–99)

## 2017-08-16 LAB — BASIC METABOLIC PANEL
ANION GAP: 11 (ref 5–15)
BUN: 20 mg/dL (ref 6–20)
CO2: 26 mmol/L (ref 22–32)
Calcium: 8.8 mg/dL — ABNORMAL LOW (ref 8.9–10.3)
Chloride: 94 mmol/L — ABNORMAL LOW (ref 101–111)
Creatinine, Ser: 3.61 mg/dL — ABNORMAL HIGH (ref 0.61–1.24)
GFR calc non Af Amer: 17 mL/min — ABNORMAL LOW (ref >60)
GFR, EST AFRICAN AMERICAN: 20 mL/min — AB (ref >60)
Glucose, Bld: 92 mg/dL (ref 65–99)
Potassium: 4.5 mmol/L (ref 3.5–5.1)
SODIUM: 131 mmol/L — AB (ref 135–145)

## 2017-08-16 LAB — HEPATIC FUNCTION PANEL
ALT: 12 U/L — ABNORMAL LOW (ref 17–63)
AST: 26 U/L (ref 15–41)
Albumin: 3 g/dL — ABNORMAL LOW (ref 3.5–5.0)
Alkaline Phosphatase: 175 U/L — ABNORMAL HIGH (ref 38–126)
BILIRUBIN DIRECT: 0.6 mg/dL — AB (ref 0.1–0.5)
BILIRUBIN INDIRECT: 0.9 mg/dL (ref 0.3–0.9)
TOTAL PROTEIN: 6.9 g/dL (ref 6.5–8.1)
Total Bilirubin: 1.5 mg/dL — ABNORMAL HIGH (ref 0.3–1.2)

## 2017-08-16 LAB — PROTIME-INR
INR: 2.92
PROTHROMBIN TIME: 30.3 s — AB (ref 11.4–15.2)

## 2017-08-16 LAB — PHOSPHORUS: Phosphorus: 3.9 mg/dL (ref 2.5–4.6)

## 2017-08-16 MED ORDER — SODIUM CHLORIDE 0.9% FLUSH: 3.0000 mL | INTRAVENOUS | Status: DC | PRN

## 2017-08-16 MED ORDER — SODIUM CHLORIDE 0.9% FLUSH
3.0000 mL | Freq: Two times a day (BID) | INTRAVENOUS | Status: DC
  Administered 2017-08-16 – 2017-08-19 (×7): 3 mL via INTRAVENOUS

## 2017-08-16 MED ORDER — ASPIRIN 81 MG PO CHEW
81.0000 mg | CHEWABLE_TABLET | ORAL | Status: AC
  Administered 2017-08-17: 81 mg via ORAL
  Filled 2017-08-16: qty 1

## 2017-08-16 NOTE — Progress Notes (Signed)
Patient ID: Michael [name], male   DOB: 27-Aug-1961, 56 y.o.   MRN: [id number]  [facility] Progress Note    Subjective:   No new complaints   Objective:   BP 105/65   Pulse 81   Temp 98.5 F (36.9 C) (Oral)   Resp 16   Ht 5\' 5"  (1.651 m)   Wt 78.4 kg (172 lb 13.5 oz)   SpO2 99%   BMI 28.76 kg/m   Intake/Output: I/O last 3 completed shifts: In: 770 [P.O.:720; IV Piggyback:50] Out: 925 [Urine:925]   Intake/Output this shift:  No intake/output data recorded. Weight change: 3.2 kg (7 lb 0.9 oz)  Physical Exam: Gen:NAD CVS:no rub Resp:cta UYQ:IHKVQQ VZD:GLOVFIE pretibial and presacral edema, LUE AVF +T/B, some forearm edema, no erythema.   Labs: BMET  Recent Labs Lab 08/10/17 1400 08/10/17 1416 08/11/17 3329 08/11/17 1513 08/12/17 0554 08/12/17 0758 08/13/17 0546 08/14/17 0715 08/16/17 0442  NA 133* 135 135 133* 134*  --  135 131* 131*  K 3.9 3.9 4.0 4.1 3.8  --  4.1 4.3 4.5  CL 97* 94* 99* 98* 97*  --  101 97* 94*  CO2 26  --  27 27 26   --  25 28 26   GLUCOSE 93 89 97 68 126*  --  86 107* 92  BUN 15 18 20  21* 24*  --  15 23* 20  CREATININE 3.53* 3.40* 3.98* 3.90* 4.01*  --  3.39* 4.29* 3.61*  ALBUMIN 3.2*  --   --  3.0* 2.7* 2.7*  --  2.7* 3.0*  CALCIUM 8.7*  --  8.4* 8.6* 8.3*  --  8.5* 8.6* 8.8*  PHOS  --   --   --  3.9 3.8  --   --  4.4 3.9   CBC  Recent Labs Lab 08/12/17 0758 08/13/17 0546 08/14/17 0715 08/16/17 0442  WBC 6.3 5.5 6.9 5.9  HGB 10.0* 10.4* 10.2* 11.1*  HCT 30.9* 32.9* 32.1* 34.8*  MCV 84.2 85.2 83.8 83.5  PLT 135* 130* 150 243    @IMGRELPRIORS @ Medications:    . allopurinol  150 mg Oral Daily  . atorvastatin  20 mg Oral Daily  . calcium acetate  667 mg Oral TID WC  . diclofenac sodium  4 g Topical QID  . insulin aspart  0-5 Units Subcutaneous QHS  . insulin aspart  0-9 Units Subcutaneous TID WC  . lidocaine  1 patch Transdermal Q24H  . metoprolol succinate  25 mg Oral BID  . midodrine  10 mg Oral  TID WC  . pantoprazole  40 mg Oral Daily  . predniSONE  10 mg Oral Q breakfast  . sodium chloride flush  3 mL Intravenous Q12H   Dialysis Orders: MWFS4hrs, BFR 400, DFR 800, EDW 75kg, 2K/2.25Ca, TDC  Heparin 4200 units IV venofer 50mg  IV qwk - last 9/19 Mircera 223mcg IV q2wks - last 9/19  Assessment/ Plan:   1. A fib with RVR- rate currentlty 72 after metoprolol started. bp 114/69. Also used 4K bath and slowed bfr to 300 which helped with HD 2. + nuclear myoview for ischemia- plan for cardiac cath on Monday 3. ESRD cont with MWFSat HD due to cmp and hypotension.  Hold off on extra treatment for now as he appears euvolemic and has had issues with Afib/RVR.   4. Anemia of CKD stage 5- Hgb stable 5. Vascular access- LUE AVF +T/B, edema of lower arm improving. VVS following, no DVT and felt to be due to  phlebitis.  6. Disposition- appreciate Palliative Care input and await discussion with daughter  Michael Potts, MD [place] Pager [phone] 08/16/2017, 11:24 AM

## 2017-08-16 NOTE — Progress Notes (Signed)
[facility] NOTE  Pharmacy Consult to start IV Heparin once INR <2.0; Warfarin consult  Indication: atrial fibrillation  No Known Allergies  Vital Signs: Temp: 98.5 F (36.9 C) (09/30 0450) Temp Source: Oral (09/30 0450) BP: 106/71 (09/30 0450) Pulse Rate: 65 (09/30 0450)  Labs:  Recent Labs  08/14/17 0446 08/14/17 0715 08/15/17 0522 08/16/17 0442  HGB  --  10.2*  --  11.1*  HCT  --  32.1*  --  34.8*  PLT  --  150  --  243  LABPROT 38.6*  --  32.3* 30.3*  INR 3.99  --  3.18 2.92  CREATININE  --  4.29*  --  3.61*    Estimated Creatinine Clearance: 21.8 mL/min (A) (by C-G formula based on SCr of 3.61 mg/dL (H)).   Assessment: 56yom with suspected cardiac arrest on HD center on coumadin pta for afib/bioprosthetic MVR. Pharmacy consulted for Warfarin management. Pharmacy consulted to start IV heparin once INR <2.0. Last dose of warfarin 9/25. INR at 2.92 today (slowly coming down from 3.99>3.18>2.92)   Home dose: 2.5mg  daily  Goal of Therapy:  INR 2-3 Monitor platelets by anticoagulation protocol: Yes   Plan:  No heparin IV needed at this time.  F/u daily INR   Angus Seller, PharmD Pharmacy Resident 08/16/2017 8:01 AM

## 2017-08-16 NOTE — Progress Notes (Signed)
Subjective:  Still complains of chest wall soreness.  No anginal pain or shortness of breath.  He was placed on heparin yesterday in anticipation of catheterization tomorrow.  Objective:  Vital Signs in the last 24 hours: BP 106/71 (BP Location: Right Arm)   Pulse 65   Temp 98.5 F (36.9 C) (Oral)   Resp 16   Ht 5\' 5"  (1.651 m)   Wt 78.4 kg (172 lb 13.5 oz)   SpO2 99%   BMI 28.76 kg/m   Physical Exam: Pleasant Asian male in no acute distress Lungs:  Clear Cardiac:  Irregular irregular rhythm, normal S1 and S2, no S3, 2 to 3/6 systolic murmur at the apex Abdomen:  Soft, nontender, no masses Extremities:  No edema present, dialysis fistula present  Intake/Output from previous day: 09/29 0701 - 09/30 0700 In: 720 [P.O.:720] Out: 850 [Urine:850]  Weight Filed Weights   08/14/17 1104 08/15/17 0444 08/16/17 0450  Weight: 75.2 kg (165 lb 12.6 oz) 78.8 kg (173 lb 11.2 oz) 78.4 kg (172 lb 13.5 oz)    Lab Results: Basic Metabolic Panel:  Recent Labs  08/14/17 0715 08/16/17 0442  NA 131* 131*  K 4.3 4.5  CL 97* 94*  CO2 28 26  GLUCOSE 107* 92  BUN 23* 20  CREATININE 4.29* 3.61*   CBC:  Recent Labs  08/14/17 0715 08/16/17 0442  WBC 6.9 5.9  HGB 10.2* 11.1*  HCT 32.1* 34.8*  MCV 83.8 83.5  PLT 150 243   Telemetry: Personally reviewed Atrial fibrillation with controlled response  Assessment/Plan:  1.  Previous cardiac arrest 2.  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  Severe pulmonary hypertension with right ventricular dysfunction by echo  Recommendations:  He had catheterization prior to his valve replacement in 2011 in [place].  He understands procedure.  Because he is on hemodialysis will need to do femoral approach. Cardiac catheterization was discussed with the patient fully including risks of myocardial infarction, death, stroke, bleeding, arrhythmia, dye allergy, renal insufficiency or bleeding.  The patient  understands and is willing to proceed.  Possibility of intervention at the same time also discussed with patient and he understands and is agreeable to proceed.   Will need a right and left heart cath because of the significant pulmonary hypertension noted on echo.       Kerry Hough  MD [facility]  08/16/2017, 8:27 AM

## 2017-08-16 NOTE — Progress Notes (Signed)
   Subjective:  Patient was evaluated this morning. He denies chest pain or shortness of breath.  He was resting comfortably in his bedside chair playing on his cell phone.  Objective:  Vital signs in last 24 hours: Vitals:   08/15/17 1100 08/15/17 1539 08/15/17 2059 08/16/17 0450  BP:  125/76 114/75 106/71  Pulse:  (!) 56 75 65  Resp:    16  Temp: 98.2 F (36.8 C) 98.2 F (36.8 C) 98.3 F (36.8 C) 98.5 F (36.9 C)  TempSrc: Oral Oral Oral Oral  SpO2: 100% 100% 99% 99%  Weight:    172 lb 13.5 oz (78.4 kg)  Height:       Physical Exam  Constitutional: He is well-developed, well-nourished, and in no distress.  Cardiovascular: Normal rate, regular rhythm and normal heart sounds.  Exam reveals no gallop and no friction rub.   No murmur heard. Pulmonary/Chest: Effort normal and breath sounds normal. No respiratory distress. He has no wheezes. He has no rales.  Abdominal: Soft. He exhibits no distension. There is no tenderness.  Musculoskeletal:  Very mild redness to left upper extremity In swelling noted in right hand  Skin: Skin is warm and dry.     Assessment/Plan:  Active Problems:   DM type 2 (diabetes mellitus, type 2) (HCC)   Permanent atrial fibrillation (HCC)   ESRD (end stage renal disease) on dialysis [facility])   Syncope and collapse   Chest pain  Cardiac arrest/syncope  Patient presented to the ED after he became unresponsive, pulseless and apneic during his hemodialysis session. CPR was performed and patient became responsive and had ROSC. Cardiology is following this admission.  He had a Myoview that showed reversibility in mid inferior wall. EF greater than 50%. Patient is scheduled for cardiac cath on 10/1.  -D/C Warfarin in anticipation of cath; Heparin per pharmacy -cardiac cath 10/1  Left upper extremity cellulitis During admission patient complained of increasing left forearm swelling and pain. There was concern for a clotted fistula. Left upper extremity  Doppler was negative. He was started on Vanc and cefepime for possible cellulitis. This has been de-escalated to cefazolin and patient is currently improving with current antibiotics. -Cefazolin  Chronic Atrial fibrillation Patient had an episode of A. Fib with RVR in hemodialysis 9/26 while inpatient. Patient was started on warfarin amiodarone and metoprolol. Patient is currently on metoprolol and heparin (switched from warfarin in anticipation of cardiac cath 10/1) -heparin -metorprolol  ESRD on HD MWFS - Nephrology following - midodrine  Right 4th digit pain and swelling Suspect gout flair. Patient states this is similar to previous flairs. - Prednisone 10mg  Daily, end date 10/2  DM On 10 units Levemir qhsat home, currently has run out.  - SSI  Elevated ALP Patient has persistent elevated ALP. Abdominal ultrasound in the past has revealed gallbladder wall thickening. He also has persistent elevation in bilirubin. Based on imaging in the past including CT scan and ultrasound of abdomen there is suggestion of liver cirrhosis.  Patient has currently multiple medical issues and once he has improved would consider repeat abdominal ultrasound for further evaluation.  -repeat imaging for further evaluation   Dispo: Anticipated discharge pending clinical improvement.   Kalman Shan [place], DO 08/16/2017, 7:44 AM Pager: [phone]

## 2017-08-17 ENCOUNTER — Other Ambulatory Visit: Payer: Self-pay

## 2017-08-17 DIAGNOSIS — R9439 Abnormal result of other cardiovascular function study: Secondary | ICD-10-CM

## 2017-08-17 DIAGNOSIS — I132 Hypertensive heart and chronic kidney disease with heart failure and with stage 5 chronic kidney disease, or end stage renal disease: Secondary | ICD-10-CM

## 2017-08-17 DIAGNOSIS — E1122 Type 2 diabetes mellitus with diabetic chronic kidney disease: Secondary | ICD-10-CM

## 2017-08-17 LAB — CBC
HCT: 34.6 % — ABNORMAL LOW (ref 39.0–52.0)
Hemoglobin: 11.1 g/dL — ABNORMAL LOW (ref 13.0–17.0)
MCH: 26.4 pg (ref 26.0–34.0)
MCHC: 32.1 g/dL (ref 30.0–36.0)
MCV: 82.2 fL (ref 78.0–100.0)
Platelets: 268 10*3/uL (ref 150–400)
RBC: 4.21 MIL/uL — AB (ref 4.22–5.81)
RDW: 18.3 % — ABNORMAL HIGH (ref 11.5–15.5)
WBC: 8.9 10*3/uL (ref 4.0–10.5)

## 2017-08-17 LAB — BASIC METABOLIC PANEL
ANION GAP: 8 (ref 5–15)
BUN: 27 mg/dL — ABNORMAL HIGH (ref 6–20)
CALCIUM: 8.7 mg/dL — AB (ref 8.9–10.3)
CO2: 25 mmol/L (ref 22–32)
CREATININE: 3.8 mg/dL — AB (ref 0.61–1.24)
Chloride: 97 mmol/L — ABNORMAL LOW (ref 101–111)
GFR, EST AFRICAN AMERICAN: 19 mL/min — AB (ref >60)
GFR, EST NON AFRICAN AMERICAN: 16 mL/min — AB (ref >60)
GLUCOSE: 104 mg/dL — AB (ref 65–99)
Potassium: 4.7 mmol/L (ref 3.5–5.1)
Sodium: 130 mmol/L — ABNORMAL LOW (ref 135–145)

## 2017-08-17 LAB — PROTIME-INR
INR: 2.67
PROTHROMBIN TIME: 28.2 s — AB (ref 11.4–15.2)

## 2017-08-17 LAB — GLUCOSE, CAPILLARY
GLUCOSE-CAPILLARY: 82 mg/dL (ref 65–99)
GLUCOSE-CAPILLARY: 100 mg/dL — AB (ref 65–99)
Glucose-Capillary: 107 mg/dL — ABNORMAL HIGH (ref 65–99)
Glucose-Capillary: 133 mg/dL — ABNORMAL HIGH (ref 65–99)

## 2017-08-17 MED ORDER — PHYTONADIONE 5 MG PO TABS
2.5000 mg | ORAL_TABLET | Freq: Once | ORAL | Status: AC
  Administered 2017-08-17: 2.5 mg via ORAL
  Filled 2017-08-17: qty 1

## 2017-08-17 MED ORDER — CEFAZOLIN SODIUM-DEXTROSE 1-4 GM/50ML-% IV SOLN
1.0000 g | Freq: Two times a day (BID) | INTRAVENOUS | Status: DC
  Administered 2017-08-17 (×2): 1 g via INTRAVENOUS
  Filled 2017-08-17 (×4): qty 50

## 2017-08-17 NOTE — Progress Notes (Signed)
[facility] Progress Note  Subjective: on nasal O2, no sig cough or SOB, +CP persists, no abd pain or n/v/d, has not gotten OOB yet  Vitals:   08/17/17 0300 08/17/17 0400 08/17/17 0415 08/17/17 0817  BP:   110/79 102/73  Pulse:      Resp: 16 14 18 10   Temp:   98.7 F (37.1 C) 97.9 F (36.6 C)  TempSrc:    Oral  SpO2:   100%   Weight:      Height:        Inpatient medications: . allopurinol  150 mg Oral Daily  . atorvastatin  20 mg Oral Daily  . calcium acetate  667 mg Oral TID WC  . diclofenac sodium  4 g Topical QID  . insulin aspart  0-5 Units Subcutaneous QHS  . insulin aspart  0-9 Units Subcutaneous TID WC  . lidocaine  1 patch Transdermal Q24H  . metoprolol succinate  25 mg Oral BID  . midodrine  10 mg Oral TID WC  . pantoprazole  40 mg Oral Daily  . phytonadione  2.5 mg Oral Once  . predniSONE  10 mg Oral Q breakfast  . sodium chloride flush  3 mL Intravenous Q12H   .  ceFAZolin (ANCEF) IV    . ferric gluconate (FERRLECIT/NULECIT) IV     HYDROcodone-acetaminophen, morphine injection, senna-docusate, sodium chloride flush  Exam: Alert, nasal O2, no distress +JVD Chest clear bilat RRR Abd obese, ntnd Ext diffuse 2+ pitting edema from legs to hips to back  CXR - 9/24, R sided opacities  Dialysis: MWF  4h  75kg   2/2.5  TDC R / maturing LUA AVF  Hep 4200 -venofer 50/wk -mircera 200 q 2, last 9.19      Impression: 1.  Afib / RVR - on metoprolol bid 2.  +Nuclear cardiac stress - for cath when INR acceptable 3.  ESRD 4 HD/ wk d/t low bp's 4.  Hypotension - chronic on midodrine 5.  Vasc access - L arm AVF recently done Aug '18 6.  Vol excess - 3kg up, has diffuse edema, may need serial HD and lower edw 7.  CM w low EF 8.  SP mitral valve replace/ TV repair 9.  Cardiac arrest - 9/24 on dialysis at OP center, brief CPR and pt recovered on his own  Plan - HD today, 2-3 L UF, midodrine, cath when INR down   Kelly Splinter MD [place] pager [phone]   08/17/2017, 10:00 AM    Recent Labs Lab 08/12/17 0554  08/14/17 0715 08/16/17 0442 08/17/17 0442  NA 134*  < > 131* 131* 130*  K 3.8  < > 4.3 4.5 4.7  CL 97*  < > 97* 94* 97*  CO2 26  < > 28 26 25   GLUCOSE 126*  < > 107* 92 104*  BUN 24*  < > 23* 20 27*  CREATININE 4.01*  < > 4.29* 3.61* 3.80*  CALCIUM 8.3*  < > 8.6* 8.8* 8.7*  PHOS 3.8  --  4.4 3.9  --   < > = values in this interval not displayed.  Recent Labs Lab 08/10/17 1400  08/12/17 0758 08/14/17 0715 08/16/17 0442  AST 30  --  24  --  26  ALT 13*  --  12*  --  12*  ALKPHOS 142*  --  150*  --  175*  BILITOT 2.8*  --  1.9*  --  1.5*  PROT 6.8  --  6.4*  --  6.9  ALBUMIN 3.2*  < > 2.7* 2.7* 3.0*  < > = values in this interval not displayed.  Recent Labs Lab 08/14/17 0715 08/16/17 0442 08/17/17 0442  WBC 6.9 5.9 8.9  HGB 10.2* 11.1* 11.1*  HCT 32.1* 34.8* 34.6*  MCV 83.8 83.5 82.2  PLT 150 243 268   Iron/TIBC/Ferritin/ %Sat    Component Value Date/Time   IRON 55 07/14/2017 0506   TIBC 392 07/14/2017 0506   FERRITIN 265 07/14/2017 0506   IRONPCTSAT 14 (L) 07/14/2017 9795

## 2017-08-17 NOTE — Care Management Important Message (Signed)
Important Message  Patient Details  Name: Michael [name] MRN: [id number] Date of Birth: 12-16-1960   Medicare Important Message Given:  Yes    Aravind Chrismer Abena 08/17/2017, 10:02 AM

## 2017-08-17 NOTE — Progress Notes (Signed)
Cardiologist at bedside discussing plan of care with patient. Cardiac cath cancelled for today d/t INR level.

## 2017-08-17 NOTE — Progress Notes (Signed)
Nephrologist at bedside. Assessed patient. Reviewed plan of care. Hemodialysis today, with either second or third group.

## 2017-08-17 NOTE — Progress Notes (Addendum)
DAILY PROGRESS NOTE   Patient Name: Michael [name] Date of Encounter: 08/17/2017  Hospital Problem List   Active Problems:   DM type 2 (diabetes mellitus, type 2) ([place])   Permanent atrial fibrillation (HCC)   ESRD (end stage renal disease) on dialysis [facility])   Syncope and collapse   Chest pain    Chief Complaint   Chest wall soreness  Subjective   Plan was for [facility] today, however INR 2.67. Will need to postpone cath. She is ESRD on HD (M,W,F). Nuclear stress test on 9/25 showed inferior ischemia with EF 53%. He has been on warfarin, but this is being held and is now on heparin.   Objective   Vitals:   08/17/17 0300 08/17/17 0400 08/17/17 0415 08/17/17 0817  BP:   110/79 102/73  Pulse:      Resp: '16 14 18 10  ' Temp:   98.7 F (37.1 C) 97.9 F (36.6 C)  TempSrc:    Oral  SpO2:   100%   Weight:      Height:        Intake/Output Summary (Last 24 hours) at 08/17/17 0912 Last data filed at 08/17/17 0420  Gross per 24 hour  Intake              360 ml  Output              225 ml  Net              135 ml   Filed Weights   08/14/17 1104 08/15/17 0444 08/16/17 0450  Weight: 165 lb 12.6 oz (75.2 kg) 173 lb 11.2 oz (78.8 kg) 172 lb 13.5 oz (78.4 kg)    Physical Exam   General appearance: alert and no distress Neck: no carotid bruit, no JVD and thyroid not enlarged, symmetric, no tenderness/mass/nodules Lungs: clear to auscultation bilaterally Heart: irregularly irregular rhythm Abdomen: soft, non-tender; bowel sounds normal; no masses,  no organomegaly Extremities: extremities normal, atraumatic, no cyanosis or edema Pulses: 2+ and symmetric Skin: Skin color, texture, turgor normal. No rashes or lesions Neurologic: Grossly normal Psych: Pleasant  Inpatient Medications    Scheduled Meds: . allopurinol  150 mg Oral Daily  . atorvastatin  20 mg Oral Daily  . calcium acetate  667 mg Oral TID WC  . diclofenac sodium  4 g Topical QID  . insulin aspart  0-5  Units Subcutaneous QHS  . insulin aspart  0-9 Units Subcutaneous TID WC  . lidocaine  1 patch Transdermal Q24H  . metoprolol succinate  25 mg Oral BID  . midodrine  10 mg Oral TID WC  . pantoprazole  40 mg Oral Daily  . predniSONE  10 mg Oral Q breakfast  . sodium chloride flush  3 mL Intravenous Q12H    Continuous Infusions: .  ceFAZolin (ANCEF) IV Stopped (08/16/17 2352)  . ferric gluconate (FERRLECIT/NULECIT) IV      PRN Meds: HYDROcodone-acetaminophen, morphine injection, senna-docusate, sodium chloride flush   Labs   Results for orders placed or performed during the hospital encounter of 08/10/17 (from the past 48 hour(s))  Glucose, capillary     Status: Abnormal   Collection Time: 08/15/17 11:43 AM  Result Value Ref Range   Glucose-Capillary 128 (H) 65 - 99 mg/dL  Glucose, capillary     Status: None   Collection Time: 08/15/17  4:59 PM  Result Value Ref Range   Glucose-Capillary 89 65 - 99 mg/dL  Glucose, capillary  Status: Abnormal   Collection Time: 08/15/17  9:16 PM  Result Value Ref Range   Glucose-Capillary 117 (H) 65 - 99 mg/dL  Protime-INR     Status: Abnormal   Collection Time: 08/16/17  4:42 AM  Result Value Ref Range   Prothrombin Time 30.3 (H) 11.4 - 15.2 seconds   INR 2.92   CBC     Status: Abnormal   Collection Time: 08/16/17  4:42 AM  Result Value Ref Range   WBC 5.9 4.0 - 10.5 K/uL   RBC 4.17 (L) 4.22 - 5.81 MIL/uL   Hemoglobin 11.1 (L) 13.0 - 17.0 g/dL   HCT 34.8 (L) 39.0 - 52.0 %   MCV 83.5 78.0 - 100.0 fL   MCH 26.6 26.0 - 34.0 pg   MCHC 31.9 30.0 - 36.0 g/dL   RDW 18.8 (H) 11.5 - 15.5 %   Platelets 243 150 - 400 K/uL  Hepatic function panel     Status: Abnormal   Collection Time: 08/16/17  4:42 AM  Result Value Ref Range   Total Protein 6.9 6.5 - 8.1 g/dL   Albumin 3.0 (L) 3.5 - 5.0 g/dL   AST 26 15 - 41 U/L   ALT 12 (L) 17 - 63 U/L   Alkaline Phosphatase 175 (H) 38 - 126 U/L   Total Bilirubin 1.5 (H) 0.3 - 1.2 mg/dL   Bilirubin,  Direct 0.6 (H) 0.1 - 0.5 mg/dL   Indirect Bilirubin 0.9 0.3 - 0.9 mg/dL  Basic metabolic panel     Status: Abnormal   Collection Time: 08/16/17  4:42 AM  Result Value Ref Range   Sodium 131 (L) 135 - 145 mmol/L   Potassium 4.5 3.5 - 5.1 mmol/L   Chloride 94 (L) 101 - 111 mmol/L   CO2 26 22 - 32 mmol/L   Glucose, Bld 92 65 - 99 mg/dL   BUN 20 6 - 20 mg/dL   Creatinine, Ser 3.61 (H) 0.61 - 1.24 mg/dL   Calcium 8.8 (L) 8.9 - 10.3 mg/dL   GFR calc non Af Amer 17 (L) >60 mL/min   GFR calc Af Amer 20 (L) >60 mL/min    Comment: (NOTE) The eGFR has been calculated using the CKD EPI equation. This calculation has not been validated in all clinical situations. eGFR's persistently <60 mL/min signify possible Chronic Kidney Disease.    Anion gap 11 5 - 15  Phosphorus     Status: None   Collection Time: 08/16/17  4:42 AM  Result Value Ref Range   Phosphorus 3.9 2.5 - 4.6 mg/dL  Glucose, capillary     Status: None   Collection Time: 08/16/17  8:07 AM  Result Value Ref Range   Glucose-Capillary 86 65 - 99 mg/dL  Glucose, capillary     Status: Abnormal   Collection Time: 08/16/17 11:18 AM  Result Value Ref Range   Glucose-Capillary 102 (H) 65 - 99 mg/dL  Glucose, capillary     Status: Abnormal   Collection Time: 08/16/17  4:02 PM  Result Value Ref Range   Glucose-Capillary 143 (H) 65 - 99 mg/dL  Glucose, capillary     Status: Abnormal   Collection Time: 08/16/17  8:37 PM  Result Value Ref Range   Glucose-Capillary 125 (H) 65 - 99 mg/dL  Protime-INR     Status: Abnormal   Collection Time: 08/17/17  4:42 AM  Result Value Ref Range   Prothrombin Time 28.2 (H) 11.4 - 15.2 seconds   INR 2.67  Basic metabolic panel     Status: Abnormal   Collection Time: 08/17/17  4:42 AM  Result Value Ref Range   Sodium 130 (L) 135 - 145 mmol/L   Potassium 4.7 3.5 - 5.1 mmol/L   Chloride 97 (L) 101 - 111 mmol/L   CO2 25 22 - 32 mmol/L   Glucose, Bld 104 (H) 65 - 99 mg/dL   BUN 27 (H) 6 - 20 mg/dL    Creatinine, Ser 3.80 (H) 0.61 - 1.24 mg/dL   Calcium 8.7 (L) 8.9 - 10.3 mg/dL   GFR calc non Af Amer 16 (L) >60 mL/min   GFR calc Af Amer 19 (L) >60 mL/min    Comment: (NOTE) The eGFR has been calculated using the CKD EPI equation. This calculation has not been validated in all clinical situations. eGFR's persistently <60 mL/min signify possible Chronic Kidney Disease.    Anion gap 8 5 - 15  CBC     Status: Abnormal   Collection Time: 08/17/17  4:42 AM  Result Value Ref Range   WBC 8.9 4.0 - 10.5 K/uL   RBC 4.21 (L) 4.22 - 5.81 MIL/uL   Hemoglobin 11.1 (L) 13.0 - 17.0 g/dL   HCT 34.6 (L) 39.0 - 52.0 %   MCV 82.2 78.0 - 100.0 fL   MCH 26.4 26.0 - 34.0 pg   MCHC 32.1 30.0 - 36.0 g/dL   RDW 18.3 (H) 11.5 - 15.5 %   Platelets 268 150 - 400 K/uL  Glucose, capillary     Status: None   Collection Time: 08/17/17  7:22 AM  Result Value Ref Range   Glucose-Capillary 82 65 - 99 mg/dL    ECG   Afib at 81, RBBB - Personally Reviewed  Telemetry   A-fib with CVR - Personally Reviewed  Radiology    No results found.  Cardiac Studies   N.A  Assessment   1. Active Problems: 2.   DM type 2 (diabetes mellitus, type 2) (HCC) 3.   Permanent atrial fibrillation ([place]) 4.   ESRD (end stage renal disease) on dialysis ([place]) 5.   Syncope and collapse 6.   Chest pain 7.   Plan   1. Will need to postpone cath today - INR 2.67. Warfarin being held - on IV heparin. Chest pain last night, but atypical soreness - did have abnormal myoview last week. Will need INR <1.7 for femoral cath given ESRD (therefore would not access UE and would be at higher femoral bleeding risk). Will give low dose oral vitamin K 2.5 mg today.  Time Spent Directly with Patient:  I have spent a total of 15 minutes with the patient reviewing hospital notes, telemetry, EKGs, labs and examining the patient as well as establishing an assessment and plan that was discussed personally with the patient. > 50% of time  was spent in direct patient care.  Length of Stay:  LOS: 7 days   Pixie Casino, MD, [place]  Attending Cardiologist  Direct Dial: [phone]  Fax: [phone]  Website:  [email or web address]  Nadean Corwin Blanton Kardell 08/17/2017, 9:12 AM

## 2017-08-17 NOTE — Progress Notes (Signed)
  Date: 08/17/2017  Patient name: Michael [name]  Medical record number: [id number]  Date of birth: [birth year]   I have seen and evaluated this patient and I have discussed the plan of care with the house staff. Please see their note for complete details. I concur with their findings with the following additions/corrections:   Seen together on rounds. Has had some atypical chest pain, negative workup for cardiac causes, likely due to CPR. Awaiting safe INR for catheterization. Cellulitis improving.   Oda Kilts, MD 08/17/2017, 6:30 PM

## 2017-08-17 NOTE — Progress Notes (Signed)
Pt c/o medial chest pain / pressure, EKG obtained, VSS, prn morphine given, PA notified, no new orders at present.

## 2017-08-17 NOTE — Progress Notes (Signed)
Subjective:  Patient was seen sitting comfortably in bed. He states he had some chest pain last night that was relived with his PRN pain medication. He has no complaints today. He states he was seen by cardiology this morning and was told his procedure would need to wait until his INR came down.  Objective:  Vital signs in last 24 hours: Vitals:   08/17/17 0400 08/17/17 0415 08/17/17 0817 08/17/17 1235  BP:  110/79 102/73 106/73  Pulse:      Resp: 14 18 10 16   Temp:  98.7 F (37.1 C) 97.9 F (36.6 C) 97.6 F (36.4 C)  TempSrc:   Oral Oral  SpO2:  100%    Weight:      Height:       Physical Exam  Constitutional: He is oriented to person, place, and time and well-developed, well-nourished, and in no distress.  HENT:  Head: Normocephalic and atraumatic.  Eyes: EOM are normal. Right eye exhibits no discharge. Left eye exhibits no discharge.  Cardiovascular: Exam reveals no gallop and no friction rub.   No murmur heard. Irregularly irregular rhythm  Pulmonary/Chest: Effort normal and breath sounds normal. No respiratory distress. He has no wheezes. He has no rales.  Abdominal: Soft. He exhibits no distension. There is no tenderness.  Musculoskeletal: He exhibits edema.  mild skin changes at left upper extremity  Neurological: He is alert and oriented to person, place, and time.  Skin: Skin is warm and dry.    Assessment/Plan:  Mr. Kley is a 56 yo M with a history of DM, ESRD on HD MWFSat, HTN, Chronic A. 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  Cardiac arrest/syncope  Patient presented to the ED after he became unresponsive, pulseless and apneic during his hemodialysis session. CPR was performed and patient became responsive and had ROSC. Cardiology is following this admission.  He had a Myoview that  showed reversibility in mid inferior wall. EF greater than 50%. Cardiac cath on 10/1 was delayed to 10/2 due to elevated INR. - Appreciate cardiology recommendations - Heparin per pharmacy (warfarin held for cath) - Cardiac cath delayed to 10/2  Left upper extremity cellulitis During admission patient complained of increasing left forearm swelling and pain. There was concern for a clotted fistula. Left upper extremity Doppler was negative. He was started on Vanc and cefepime for possible cellulitis. This has been de-escalated to cefazolin and patient is currently improving with current antibiotics. - Cefazolin   Chronic Atrial fibrillation Patient had an episode of A. Fib with RVR in hemodialysis 9/26 while inpatient. Patient was started on warfarin amiodarone and metoprolol. Patient is currently on metoprolol and heparin (switched from warfarin in anticipation of cardiac cath 10/1) - Heparin - Metorprolol  ESRD on HD MWFS - Nephrology following - midodrine  Right 4th digit pain and swelling Suspect gout flair. Patient states this is similar to previous flairs. - Prednisone 10mg  Daily, end date 10/2  DM  On 10 units Levemir qhsat home, currently has run out.  - SSI  Elevated ALP Patient has persistent elevated ALP. Abdominal ultrasound in the past has revealed gallbladder wall thickening. He also has persistent elevation in bilirubin. Based on imaging in the past including CT scan and ultrasound of abdomen there is suggestion of liver cirrhosis.  Patient has currently multiple medical issues  and once he has improved would consider repeat abdominal ultrasound for further evaluation.    Dispo: Anticipated discharge 1-3 day(s).   Neva Seat, MD 08/17/2017, 1:14 PM Pager: [phone]

## 2017-08-18 LAB — GLUCOSE, CAPILLARY
GLUCOSE-CAPILLARY: 62 mg/dL — AB (ref 65–99)
GLUCOSE-CAPILLARY: 77 mg/dL (ref 65–99)
GLUCOSE-CAPILLARY: 120 mg/dL — AB (ref 65–99)

## 2017-08-18 LAB — PROTIME-INR
INR: 2.1
INR: 1.71
PROTHROMBIN TIME: 19.9 s — AB (ref 11.4–15.2)
Prothrombin Time: 23.4 seconds — ABNORMAL HIGH (ref 11.4–15.2)

## 2017-08-18 MED ORDER — HEPARIN SODIUM (PORCINE) 1000 UNIT/ML DIALYSIS: 4000.0000 [IU] | Freq: Once | INTRAMUSCULAR | Status: DC

## 2017-08-18 MED ORDER — LIDOCAINE HCL (PF) 1 % IJ SOLN: 5.0000 mL | INTRAMUSCULAR | Status: DC | PRN

## 2017-08-18 MED ORDER — ALTEPLASE 2 MG IJ SOLR: 2.0000 mg | Freq: Once | INTRAMUSCULAR | Status: DC | PRN

## 2017-08-18 MED ORDER — SODIUM CHLORIDE 0.9 % IV SOLN: 100.0000 mL | INTRAVENOUS | Status: DC | PRN

## 2017-08-18 MED ORDER — LIDOCAINE-PRILOCAINE 2.5-2.5 % EX CREA: 1.0000 "application " | TOPICAL_CREAM | CUTANEOUS | Status: DC | PRN

## 2017-08-18 MED ORDER — HEPARIN (PORCINE) IN NACL 100-0.45 UNIT/ML-% IJ SOLN
1350.0000 [IU]/h | INTRAMUSCULAR | Status: DC
  Administered 2017-08-18: 1100 [IU]/h via INTRAVENOUS

## 2017-08-18 MED ORDER — PHYTONADIONE 5 MG PO TABS
2.5000 mg | ORAL_TABLET | Freq: Once | ORAL | Status: AC
  Administered 2017-08-18: 2.5 mg via ORAL
  Filled 2017-08-18: qty 1

## 2017-08-18 MED ORDER — HEPARIN SODIUM (PORCINE) 1000 UNIT/ML DIALYSIS: 1000.0000 [IU] | INTRAMUSCULAR | Status: DC | PRN

## 2017-08-18 MED ORDER — CEFAZOLIN SODIUM-DEXTROSE 1-4 GM/50ML-% IV SOLN: 1.0000 g | INTRAVENOUS | Status: DC

## 2017-08-18 MED ORDER — HEPARIN (PORCINE) IN NACL 100-0.45 UNIT/ML-% IJ SOLN
1100.0000 [IU]/h | INTRAMUSCULAR | Status: DC
  Filled 2017-08-18: qty 250

## 2017-08-18 MED ORDER — MIDODRINE HCL 5 MG PO TABS
ORAL_TABLET | ORAL | Status: AC
  Filled 2017-08-18: qty 2

## 2017-08-18 MED ORDER — PENTAFLUOROPROP-TETRAFLUOROETH EX AERO: 1.0000 "application " | INHALATION_SPRAY | CUTANEOUS | Status: DC | PRN

## 2017-08-18 NOTE — Discharge Summary (Signed)
Name: Michael [name] MRN: [id number] DOB: [birth year] 56 y.o. PCP: [name], MD  Date of Admission: 08/10/2017  2:00 PM Date of Discharge: 08/26/2017 Attending Physician: Oda Kilts, MD  Discharge Diagnosis:  yncope and Collapse: Left upper extremity cellulitis Left upper extremity fluid pocket: Chronic Atrial fibrillation: Volume overload, ESRD on HD MWFS:  Gout flair:  Discharge Medications: Allergies as of 08/26/2017   No Known Allergies     Medication List    STOP taking these medications   hydrOXYzine 10 MG tablet Commonly known as:  ATARAX/VISTARIL     TAKE these medications   ACCU-CHEK AVIVA PLUS w/Device Kit USE AS INSTRUCTED 3 TIMES DAILY   ACCU-CHEK SOFTCLIX LANCETS lancets USE AS INSTRUCTED 3 TIMES DAILY   acetaminophen 650 MG CR tablet Commonly known as:  TYLENOL Take 650 mg by mouth every 6 (six) hours as needed for pain.   acetaminophen-codeine 300-30 MG tablet Commonly known as:  TYLENOL #3 Take 1 tablet by mouth every 12 (twelve) hours as needed for moderate pain.   allopurinol 300 MG tablet Commonly known as:  ZYLOPRIM Take 0.5 tablets (150 mg total) by mouth daily.   atorvastatin 20 MG tablet Commonly known as:  LIPITOR Take 1 tablet (20 mg total) by mouth daily.   calcium acetate 667 MG capsule Commonly known as:  PHOSLO Take 1 capsule (667 mg total) by mouth 3 (three) times daily with meals.   cephALEXin 500 MG capsule Commonly known as:  KEFLEX Take 1 capsule (500 mg total) by mouth daily at 10 pm.   colchicine 0.6 MG tablet Take 1.2 mg by mouth daily as needed for pain.   Darbepoetin Alfa 200 MCG/0.4ML Sosy injection Commonly known as:  ARANESP Inject 0.4 mLs (200 mcg total) into the skin every Saturday at 6 PM.   diclofenac sodium 1 % Gel Commonly known as:  VOLTAREN Apply 4 g topically 4 (four) times daily.   docusate sodium 100 MG capsule Commonly known as:  COLACE Take 1 capsule (100 mg total) by  mouth 2 (two) times daily.   folic acid 1 MG tablet Commonly known as:  FOLVITE TAKE 1 TABLET BY MOUTH DAILY.   glucose blood test strip Commonly known as:  ACCU-CHEK AVIVA PLUS USE 3 TIMES DAILY AS DIRECTED.   Insulin Detemir 100 UNIT/ML Pen Commonly known as:  LEVEMIR FLEXTOUCH Inject 10 Units into the skin daily at 10 pm.   metoprolol succinate 25 MG 24 hr tablet Commonly known as:  TOPROL-XL Take 1 tablet (25 mg total) by mouth 2 (two) times daily.   midodrine 5 MG tablet Commonly known as:  PROAMATINE Take 1 tablet (5 mg total) by mouth 3 (three) times daily with meals.   omeprazole 20 MG capsule Commonly known as:  PRILOSEC Take 1 capsule (20 mg total) by mouth daily.   OXYGEN Inhale 2 L into the lungs daily.   tiZANidine 4 MG tablet Commonly known as:  ZANAFLEX Take 1 tablet (4 mg total) by mouth every 8 (eight) hours as needed for muscle spasms.   warfarin 2.5 MG tablet Commonly known as:  COUMADIN Take 1 tablet (2.5 mg total) by mouth daily at 6 PM. Or as directed by coumadin clinic       Disposition and follow-up:   Mr.Jerin Szczesniak was discharged from [facility] in [place] condition.  At the hospital follow up visit please address:  1. Syncope and Collapse: Please monitor for signs/syptons of CAD. Please monitor patient's  hypotension and response to medication.  2. Left upper extremity cellulitis / Fluid pocket: Monitor recurrent LUE erythema and edema as well as fever or leukocytosis. Fluid pocket to be addressed by vascular surgery at the time of their next procedure.   3. Volume overload ESRD on HD MWFS: Patient discharged on 4 days / week MWFS. Monitor patient's response to dialysis. Coordinate with his nephrologist regarding his progress and dialysis schedule.  4. Gout flair: Resolved with steroid treatment. Monitor for recurrent symptoms.   5. Other: - During his hospitalization a patient had elevated ALP and bilirubin. Prior  ultrasound showed gallbladder wall thickening. Prior imaging also suggestive of liver cirrhosis. Consider repeat abdominal ultrasound for further evaluation.  - On day of discharge, patient mentioned he has chronic numb-like pain (for at least the past year) in his left hand and that comes and goes in his foot. Please evaluate for possible neuropathy.  6.  Labs / imaging needed at time of follow-up: None  7.  Pending labs/ test needing follow-up: None  Follow-up Appointments: Follow-up Information    [name], MD. Call in 2 day(s).   Specialty:  Family Medicine Why:  Please call in the next 1-2 days to scheule an appoitment with you primary care provider in the next week. Contact information: [place] [address] [phone]        Fay Records, MD. Call in 2 day(s).   Specialty:  Cardiology Why:  Please call to schedule a follw up appointment with cardiology Contact information: [address] [place] [address] [phone]        Care, [facility] Follow up.   Specialty:  Home Health Services Why:  Registered Nurse and Physical Therapy Contact information: [place] [address] [phone]           Hospital Course by problem list:   1. Syncope and Collapse:  Patient presented following loss of consciousness during dialysis. Sudden cardiac arrest was supected as he was reportedly apneic and pulseless. CPR was performed and patient spontaneouslyregained consciousness within minutes. Exact etiology unclear. Differential includes cardiac arrest , seizure, pulmonary embolism, or possibly A. Fib with RVR and PEA similar to brief episode experienced as an inpatient (during a dialysis session). V/Q cancelled due to conflict with Myoview study. (and ultimately not ordered to do lower likelihood of a significant PE). Troponin trend 0.05, 0.04, 0.03. Repeat EKG 9/25, unchanged to prior.  Echocardiogram showed EF 50-55%, D-shaped interventricular septum suggests RV pressure/volume overload, there was severe regurgitation (s/p tricuspid valve repair).  Myoview showed reversible ischemia, intermediate risk. 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 Cardiology recommended no intervention and stated the patient can follow up with Dr. Harrington Challenger following discharge. Exact etiology of his syncope and collapse not elucidated by workup.  2. Left upper extremity cellulitis: During admission patient complained of increasing left forearm swelling and pain. There was concern for a clotted fistula. Left upper extremity Doppler was negative. He was started on Vanc and cefepime for suspected cellulitis vs thrombophlebitis. This was de-escalated to cefazolin and patient improved. He completed a 7 day course. 1 day after discontinuation of antibiotic (and after a heart cath and HD that day) patient was found to be tachycardia and bebrile (101.8, axillary) with increased warmth and  edema in LUE (10/3). Concern for recurrent Cellulitis vs Thrombophlebitis. Blood cultures negative. LUE Venous duplex showed: No evidence of DVT or superficial thrombosis and fluid collection with mixed internal echoes noted anterior to AVF. Patient discharged on Keflex to complete a 10 day total course, last dose 10/13.   3. LUE Fluid pocket: LUE Venous duplex showed: No evidence of DVT or superficial thrombosis and fluid collection with mixed internal echoes noted anterior to AVF. Vascular surgery recommendations were to address this fluid collection as the time of the second stage transposition.  3. Chronic Atrial fibrillation Patient on metoprolol at home. He had an episode of A. Fib with RVR in hemodialysis 9/26 while inpatient. Patient was  started on amiodarone following this episode, which was discontinued after 2 days. Patient remained on metoprolol though some evening doses were held due to hypotension. Patient was bridged back to warfarin after his catheterization.  4. Volume overload: ESRD on HD MWFS: Patient has experienced significant difficulties with HD throughout his hospitalization. He continues to have volume overload despite attempts at HD. He also has chronic hypotension which is exacerbated by HD. In the setting of his severe right heart failure and this failure of HD. Increased asymptomatic pleural effusion on CXR (10/4). There remains concerns about how long the patient will be able to tolerate dialysis. However, his response to dialysis did improve somewhat at the end of his hospitalization and he was discharge on a MWFS schedule.  5. Gout flair: Patient experienced right 2nd digit pain and swelling and stated this was similar to previous flairs. He was treated with a 5 day course of Prednisone and his symptoms resolved.  Discharge Vitals:   BP 105/61   Pulse 87   Temp 98.2 F (36.8 C) (Oral)   Resp (!) 22   Ht _0  (1.651 m)   Wt 165 lb 12.6 oz (75.2 kg) Comment: standing weight  SpO2 98%   BMI 27.59 kg/m   Pertinent Labs, Studies, and Procedures:  CBC Latest Ref Rng & Units 08/26/2017 08/24/2017 08/23/2017  WBC 4.0 - 10.5 K/uL 8.1 5.8 6.4  Hemoglobin 13.0 - 17.0 g/dL 10.1(L) 10.2(L) 10.6(L)  Hematocrit 39.0 - 52.0 % 31.7(L) 32.0(L) 33.0(L)  Platelets 150 - 400 K/uL 113(L) 92(L) 113(L)   BMP Latest Ref Rng & Units 08/26/2017 08/24/2017 08/23/2017  Glucose 65 - 99 mg/dL 101(H) 78 92  BUN 6 - 20 mg/dL 8 21(H) 20  Creatinine 0.61 - 1.24 mg/dL 1.99(H) 3.26(H) 2.79(H)  BUN/Creat Ratio 9 - 20 - - -  Sodium 135 - 145 mmol/L 130(L) 129(L) 130(L)  Potassium 3.5 - 5.1 mmol/L 3.3(L) 3.8 4.7  Chloride 101 - 111 mmol/L 93(L) 93(L) 94(L)  CO2 22 - 32 mmol/L _1 Calcium 8.9 - 10.3 mg/dL 8.2(L) 9.0 9.0    EKG: atrial fibrillation, RBBB, Abnormal T waves in lateral leads.   CXR (9/24): Small right pleural effusion with heterogeneous opacities right lung base which may represent infection or atelectasis.  CT Head WO Contrast (9/24): IMPRESSION: 1. No acute intracranial abnormality.  Bilat Rib Xray (9/26):  IMPRESSION: - There is no acute or significant chronic abnormality of the ribs. - There are small amounts of pleural fluid versus pleural thickening bilaterally.  LUE Vascular U/S (9/27): Summary: - No evidence of deep vein or superficial thrombosis involving the left upper extremity and right subclavian vein.  Echocardiogram (9/27): Impressions: - The patient was in atrial fibrillation. - Normal LV size with mild LV hypertrophy. -  EF 50-55%, low normal to mildly reduced. - D-shaped interventricular septum suggestive of RV pressure/volume overload. - Severely dilated RV with moderately decreased systolic function.  - Bioprosthetic mitral valve with mildly elevated mean gradient, no regurgitation noted. - Mild aortic insufficiency. - S/p TV repair but there is severe TR. - Moderate pulmonary hypertension.  NM Myoview (9/28): IMPRESSION: 1. Area of reversibility involving the mid inferior wall suggests inducible ischemia. 2. Normal left ventricular wall motion. 3. Left ventricular ejection fraction 53% 4. 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 Not a PCI option & likely not acute. - Otherwise minimal CAD elsewhere. - Hemodynamic findings consistent with at least moderate pulmonary hypertension with mildly elevated LV pressures  CXR (10/4): IMPRESSION: - Findings compatible with mild CHF. - There is a moderate sized right pleural effusion and small left pleural effusion. The right effusion has  increased in size since the previous study. - No definite pneumonia. - Thoracic aortic atherosclerosis.  LUE Vascular U/S (10/6): Summary: - No evidence of deep vein or superficial thrombosis involving theleft upper extremity and right subclavian vein. - Appears to be fluid collection with mixed internal echoesanterior to AVF.  Discharge Instructions: Discharge Instructions    AMB Referral to [place] Management    Complete by:  As directed    Please reassign to [facility] for transition of care. Please reassign to [facility] LCSW for transportation. Member has to go in person to [name] for recertification by Oct. 30th. Hospital liaison was able to get member recertified until then. Please follow up. Please see notes. Discharging home today 08/26/17. [name], [place], [facility] Liaison-[phone]   Reason for consult:  Please reassign to [facility] LCSW   Expected date of contact:  1-3 days (reserved for hospital discharges)   Call MD for:  difficulty breathing, headache or visual disturbances    Complete by:  As directed    Call MD for:  persistant dizziness or light-headedness    Complete by:  As directed    Call MD for:  persistant nausea and vomiting    Complete by:  As directed    Call MD for:  temperature >100.4    Complete by:  As directed    Diet - low sodium heart healthy    Complete by:  As directed    Discharge instructions    Complete by:  As directed    Thank you for allowing use to care for you.  Your symptoms are believed to have been to due your sudden collapse, right heart failure, and end stage renal disease.  Your dialysis schedule has been changed to 4 days / week.  - Monday, Wednesday, Friday, Saturday  You have been receiving antibiotic while admitted and will be discharged with three days left to take: - Keflex (cephalexin) 579m, Daily at 10pm (bedtime)  Please follow up with: - Your primary care provider within 1 week of  discharge. - Cardiology (Dr. RHarrington Challenger), call to make appointment.   Please return to the ED if you experience severe sypmtoms.   Increase activity slowly    Complete by:  As directed         Signed: MNeva Seat MD 08/26/2017, 10:13 PM   Pager: [phone]

## 2017-08-18 NOTE — Plan of Care (Signed)
Problem: Physical Regulation: Goal: Ability to maintain clinical measurements within normal limits will improve Outcome: Progressing Throughout night patient had 2 bowel movements, had some pain/discomfort with movement but after activity he settled well.  Patient needed help with BSC, no pain meds given at this time.

## 2017-08-18 NOTE — Progress Notes (Signed)
RN spoke with pharmacist. Heparin not currently running, order in for pharmacy dosing. Per pharmacist, heparin not usually started until INR is below 2.0. Pharmacist will address order once morning INR results.

## 2017-08-18 NOTE — Progress Notes (Signed)
[facility] Progress Note  Subjective: 2 L off with HD yest and 1.8 L off with HD today.  Had to turn off UF temp for chest pain.    Vitals:   08/18/17 0900 08/18/17 0930 08/18/17 1000 08/18/17 1027  BP: 110/62 114/64 125/73 (!) 109/53  Pulse: 73 75 80 81  Resp: 14 12 14 16   Temp:      TempSrc:    Oral  SpO2:    100%  Weight:    78.9 kg (173 lb 15.1 oz)  Height:        Inpatient medications: . allopurinol  150 mg Oral Daily  . atorvastatin  20 mg Oral Daily  . calcium acetate  667 mg Oral TID WC  . diclofenac sodium  4 g Topical QID  . insulin aspart  0-5 Units Subcutaneous QHS  . insulin aspart  0-9 Units Subcutaneous TID WC  . lidocaine  1 patch Transdermal Q24H  . metoprolol succinate  25 mg Oral BID  . midodrine      . midodrine  10 mg Oral TID WC  . pantoprazole  40 mg Oral Daily  . phytonadione  2.5 mg Oral Once  . sodium chloride flush  3 mL Intravenous Q12H   .  ceFAZolin (ANCEF) IV    . ferric gluconate (FERRLECIT/NULECIT) IV Stopped (08/17/17 2200)  . heparin     HYDROcodone-acetaminophen, morphine injection, senna-docusate, sodium chloride flush  Exam: Alert, nasal O2, no distress +JVD Chest clear bilat RRR Abd obese, ntnd Ext 1+ pitting edema LE's / dependent areas, improving  CXR - 9/24, R sided opacities  Dialysis: MWFSat 4h  75kg   2/2.5  TDC R / maturing LUA AVF  Hep 4200 -venofer 50/wk -mircera 200 q 2, last 9.19      Impression: 1.  Afib / RVR - on metoprolol bid 2.  +Nuclear cardiac stress - for cath when INR down 3.  ESRD 4 HD/ wk d/t low bp's 4.  Hypotension - chronic on midodrine 5.  Vasc access - L arm AVF recently done Aug '18 6.  Vol excess - still 3kg over dry wt, edematous, needs to cut back fluid intake. Can't get large volumes on HD due to hypotension.  Will lower threshold BP for HD tomorrow.  7.  CM w low EF 8.  SP mitral valve replace/ TV repair 9.  Cardiac arrest - 9/24 on dialysis at OP center, brief CPR  and pt recovered on his own  Plan - daily HD to get volume under control    Kelly Splinter MD [place] pager [phone]   08/18/2017, 11:19 AM    Recent Labs Lab 08/12/17 0554  08/14/17 0715 08/16/17 0442 08/17/17 0442  NA 134*  < > 131* 131* 130*  K 3.8  < > 4.3 4.5 4.7  CL 97*  < > 97* 94* 97*  CO2 26  < > 28 26 25   GLUCOSE 126*  < > 107* 92 104*  BUN 24*  < > 23* 20 27*  CREATININE 4.01*  < > 4.29* 3.61* 3.80*  CALCIUM 8.3*  < > 8.6* 8.8* 8.7*  PHOS 3.8  --  4.4 3.9  --   < > = values in this interval not displayed.  Recent Labs Lab 08/12/17 0758 08/14/17 0715 08/16/17 0442  AST 24  --  26  ALT 12*  --  12*  ALKPHOS 150*  --  175*  BILITOT 1.9*  --  1.5*  PROT 6.4*  --  6.9  ALBUMIN 2.7* 2.7* 3.0*    Recent Labs Lab 08/14/17 0715 08/16/17 0442 08/17/17 0442  WBC 6.9 5.9 8.9  HGB 10.2* 11.1* 11.1*  HCT 32.1* 34.8* 34.6*  MCV 83.8 83.5 82.2  PLT 150 243 268   Iron/TIBC/Ferritin/ %Sat    Component Value Date/Time   IRON 55 07/14/2017 0506   TIBC 392 07/14/2017 0506   FERRITIN 265 07/14/2017 0506   IRONPCTSAT 14 (L) 07/14/2017 6203

## 2017-08-18 NOTE — Progress Notes (Signed)
DAILY PROGRESS NOTE   Patient Name: Michael [name] Date of Encounter: 08/18/2017  Hospital Problem List   Principal Problem:   Syncope and collapse Active Problems:   DM type 2 (diabetes mellitus, type 2) (HCC)   Permanent atrial fibrillation (HCC)   ESRD (end stage renal disease) on dialysis [facility])   Chest pain    Chief Complaint   More pain overnight, "I hurt everywhere"  Subjective   Seen on dialysis today. INR 2.1 this morning. Has been off warfarin. Will plan to start heparin when INR <2 per pharmacy. Possibly for cath tomorrow. Pain is more atypical now - reproducible chest wall pain noted today.  Objective   Vitals:   08/18/17 0421 08/18/17 0711 08/18/17 0730 08/18/17 0800  BP: 132/81 118/70 104/67 113/61  Pulse: 70 77 73 76  Resp: '18 14 19 15  ' Temp: 97.8 F (36.6 C) 97.7 F (36.5 C)    TempSrc: Oral Oral    SpO2: 100% 100%    Weight: 173 lb 3.2 oz (78.6 kg) 178 lb 9.2 oz (81 kg)    Height:        Intake/Output Summary (Last 24 hours) at 08/18/17 7829 Last data filed at 08/17/17 2345  Gross per 24 hour  Intake              690 ml  Output             2120 ml  Net            -1430 ml   Filed Weights   08/17/17 2200 08/18/17 0421 08/18/17 0711  Weight: 172 lb 13.5 oz (78.4 kg) 173 lb 3.2 oz (78.6 kg) 178 lb 9.2 oz (81 kg)    Physical Exam   General appearance: alert and no distress Neck: no carotid bruit, no JVD and thyroid not enlarged, symmetric, no tenderness/mass/nodules Lungs: clear to auscultation bilaterally Heart: irregularly irregular rhythm Abdomen: soft, non-tender; bowel sounds normal; no masses,  no organomegaly Extremities: extremities normal, atraumatic, no cyanosis or edema Pulses: 2+ and symmetric Skin: Skin color, texture, turgor normal. No rashes or lesions Neurologic: Grossly normal Psych: Appears uncomfortable  Inpatient Medications    Scheduled Meds: . allopurinol  150 mg Oral Daily  . atorvastatin  20 mg Oral Daily   . calcium acetate  667 mg Oral TID WC  . diclofenac sodium  4 g Topical QID  . [START ON 08/19/2017] heparin  4,000 Units Dialysis Once in dialysis  . insulin aspart  0-5 Units Subcutaneous QHS  . insulin aspart  0-9 Units Subcutaneous TID WC  . lidocaine  1 patch Transdermal Q24H  . metoprolol succinate  25 mg Oral BID  . midodrine      . midodrine  10 mg Oral TID WC  . pantoprazole  40 mg Oral Daily  . sodium chloride flush  3 mL Intravenous Q12H    Continuous Infusions: . sodium chloride    . sodium chloride    .  ceFAZolin (ANCEF) IV Stopped (08/17/17 2327)  . ferric gluconate (FERRLECIT/NULECIT) IV Stopped (08/17/17 2200)    PRN Meds: sodium chloride, sodium chloride, alteplase, heparin, HYDROcodone-acetaminophen, lidocaine (PF), lidocaine-prilocaine, morphine injection, pentafluoroprop-tetrafluoroeth, senna-docusate, sodium chloride flush   Labs   Results for orders placed or performed during the hospital encounter of 08/10/17 (from the past 48 hour(s))  Glucose, capillary     Status: Abnormal   Collection Time: 08/16/17 11:18 AM  Result Value Ref Range   Glucose-Capillary 102 (H) 65 -  99 mg/dL  Glucose, capillary     Status: Abnormal   Collection Time: 08/16/17  4:02 PM  Result Value Ref Range   Glucose-Capillary 143 (H) 65 - 99 mg/dL  Glucose, capillary     Status: Abnormal   Collection Time: 08/16/17  8:37 PM  Result Value Ref Range   Glucose-Capillary 125 (H) 65 - 99 mg/dL  Protime-INR     Status: Abnormal   Collection Time: 08/17/17  4:42 AM  Result Value Ref Range   Prothrombin Time 28.2 (H) 11.4 - 15.2 seconds   INR 1.77   Basic metabolic panel     Status: Abnormal   Collection Time: 08/17/17  4:42 AM  Result Value Ref Range   Sodium 130 (L) 135 - 145 mmol/L   Potassium 4.7 3.5 - 5.1 mmol/L   Chloride 97 (L) 101 - 111 mmol/L   CO2 25 22 - 32 mmol/L   Glucose, Bld 104 (H) 65 - 99 mg/dL   BUN 27 (H) 6 - 20 mg/dL   Creatinine, Ser 3.80 (H) 0.61 - 1.24  mg/dL   Calcium 8.7 (L) 8.9 - 10.3 mg/dL   GFR calc non Af Amer 16 (L) >60 mL/min   GFR calc Af Amer 19 (L) >60 mL/min    Comment: (NOTE) The eGFR has been calculated using the CKD EPI equation. This calculation has not been validated in all clinical situations. eGFR's persistently <60 mL/min signify possible Chronic Kidney Disease.    Anion gap 8 5 - 15  CBC     Status: Abnormal   Collection Time: 08/17/17  4:42 AM  Result Value Ref Range   WBC 8.9 4.0 - 10.5 K/uL   RBC 4.21 (L) 4.22 - 5.81 MIL/uL   Hemoglobin 11.1 (L) 13.0 - 17.0 g/dL   HCT 34.6 (L) 39.0 - 52.0 %   MCV 82.2 78.0 - 100.0 fL   MCH 26.4 26.0 - 34.0 pg   MCHC 32.1 30.0 - 36.0 g/dL   RDW 18.3 (H) 11.5 - 15.5 %   Platelets 268 150 - 400 K/uL  Glucose, capillary     Status: None   Collection Time: 08/17/17  7:22 AM  Result Value Ref Range   Glucose-Capillary 82 65 - 99 mg/dL  Glucose, capillary     Status: Abnormal   Collection Time: 08/17/17 11:15 AM  Result Value Ref Range   Glucose-Capillary 107 (H) 65 - 99 mg/dL  Glucose, capillary     Status: Abnormal   Collection Time: 08/17/17  3:57 PM  Result Value Ref Range   Glucose-Capillary 100 (H) 65 - 99 mg/dL  Glucose, capillary     Status: Abnormal   Collection Time: 08/17/17 11:02 PM  Result Value Ref Range   Glucose-Capillary 133 (H) 65 - 99 mg/dL  Protime-INR     Status: Abnormal   Collection Time: 08/18/17  4:03 AM  Result Value Ref Range   Prothrombin Time 23.4 (H) 11.4 - 15.2 seconds   INR 2.10     ECG   N/A  Telemetry   A-fib - Personally Reviewed  Radiology    No results found.  Cardiac Studies   N/A  Assessment   1. Principal Problem: 2.   Syncope and collapse 3. Active Problems: 4.   DM type 2 (diabetes mellitus, type 2) ([place]) 5.   Permanent atrial fibrillation ([place]) 6.   ESRD (end stage renal disease) on dialysis ([place]) 7.   Chest pain 8.   Plan   1.  Plan for [facility] tomorrow - pain is atypical, however, there may be  concomitant CAD as well. INR too high today for cath - would be ideal to be below 1.7 for femoral approach. Will give additional 2.5 mg Vit K today. Check INR this afternoon and start heparin per pharmacy when INR <2. Keep NPO p MN.  Time Spent Directly with Patient:  I have spent a total of 15 minutes with the patient reviewing hospital notes, telemetry, EKGs, labs and examining the patient as well as establishing an assessment and plan that was discussed personally with the patient. > 50% of time was spent in direct patient care.  Length of Stay:  LOS: 8 days   Pixie Casino, MD, [place]  Attending Cardiologist  Direct Dial: [phone]  Fax: [phone]  Website:  [email or web address] Ronit Marczak 08/18/2017, 9:38 AM

## 2017-08-18 NOTE — Progress Notes (Signed)
  Date: 08/18/2017  Patient name: Michael [name]  Medical record number: [id number]  Date of birth: 1961/08/01   I have seen and evaluated this patient and I have discussed the plan of care with the house staff. Please see their note for complete details. I concur with their findings with the following additions/corrections:   Seem together on rounds this morning while at dialysis. Still complaining of intermittent chest pain that is atypical for angina and reproducible on exam, consistent with MSK pain from his CPR. He is still awaiting catheterization to evaluate the reversible changes seen previously. Hopefully, his INR will be within range tomorrow so that he will be able to go for this procedure. He completed antibiotics for his cellulitis today. His gout flare also appears to have resolved.  Oda Kilts, MD 08/18/2017, 4:30 PM

## 2017-08-18 NOTE — Progress Notes (Signed)
Repeat INR today 1.71. He is scheduled for heart catheterization tomorrow. Pharmacy to start heparin drip for permanent Afib. NPO at MN.     Tami Lin Kaleen Rochette, PA-C 08/18/2017, 4:43 PM [phone]

## 2017-08-18 NOTE — Progress Notes (Signed)
[facility] Consult to start IV Heparin once INR <2  Indication: atrial fibrillation / ACS  No Known Allergies  Vital Signs: Temp: 97.7 F (36.5 C) (10/02 0711) Temp Source: Oral (10/02 0711) BP: 125/73 (10/02 1000) Pulse Rate: 80 (10/02 1000)  Labs:  Recent Labs  08/16/17 0442 08/17/17 0442 08/18/17 0403  HGB 11.1* 11.1*  --   HCT 34.8* 34.6*  --   PLT 243 268  --   LABPROT 30.3* 28.2* 23.4*  INR 2.92 2.67 2.10  CREATININE 3.61* 3.80*  --     Estimated Creatinine Clearance: 21 mL/min (A) (by C-G formula based on SCr of 3.8 mg/dL (H)).   Assessment: 56yom with suspected cardiac arrest on HD center on coumadin pta for afib/bioprosthetic MVR.  INR today = 2.1, to start heparin once INR less than 2 in preparation for cath To receive 2.5 mg po Vitamin K today   Home dose: 2.5mg  daily  Goal of Therapy:  INR 2-3 Monitor platelets by anticoagulation protocol: Yes   Plan:  INR ordered by MD at 1600 pm Suspect INR < 2 by 6 pm, will start heparin at 1100 units / hr AM heparin level and CBC   Thank you Anette Guarneri, PharmD [phone] 08/18/2017 10:21 AM

## 2017-08-18 NOTE — Plan of Care (Signed)
Problem: Nutrition: Goal: Adequate nutrition will be maintained Patient's ask for several snacks, this was provided to patient after he came back from dialysis.

## 2017-08-18 NOTE — Progress Notes (Signed)
Subjective:  Patient was seen during his HD session today. He states he had an episode of chest pain last night and he has the pain this morning, which is reproducible with palpation.Marland Kitchen He also complains of some lower extremity soreness and back pain this morning. His cath was delayed again due to continued elevation of INR.  Objective:  Vital signs in last 24 hours: Vitals:   08/17/17 2230 08/17/17 2257 08/18/17 0012 08/18/17 0421  BP: 102/74 102/74 107/69 132/81  Pulse:  77 64 70  Resp:   16 18  Temp:   98.1 F (36.7 C) 97.8 F (36.6 C)  TempSrc:   Oral Oral  SpO2:   100% 100%  Weight:    173 lb 3.2 oz (78.6 kg)  Height:       Physical Exam  Constitutional: He is oriented to person, place, and time and well-developed, well-nourished, and in no distress.  HENT:  Head: Normocephalic and atraumatic.  Eyes: EOM are normal. Right eye exhibits no discharge. Left eye exhibits no discharge.  Cardiovascular: Exam reveals no gallop and no friction rub.   No murmur heard. Irregularly irregular rhythm  Pulmonary/Chest: Effort normal and breath sounds normal. No respiratory distress. He has no wheezes. He has no rales.  Abdominal: Soft. He exhibits no distension. There is no tenderness.  Musculoskeletal: He exhibits no edema.  Mildly darker skin over left forarm  Neurological: He is alert and oriented to person, place, and time.  Skin: Skin is warm and dry.    Assessment/Plan:  Mr. Stradling is a 56 yo M with a history of DM, ESRD on HD [facility], HTN, Chronic A. 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  Cardiac arrest/syncope  Patient presented to the ED after he became unresponsive, pulseless and apneic during his hemodialysis session. CPR was performed and patient became responsive and had ROSC. Cardiology is  following this admission.  He had a Myoview that showed reversibility in mid inferior wall. EF greater than 50%. Cardiac cath on 10/1 was delayed to 10/2 and again to 10/3 due to elevated INR. - Appreciate cardiology recommendations - Heparin per pharmacy (warfarin held for cath) - Cardiac cath delayed to 10/3  Left upper extremity cellulitis During admission patient complained of increasing left forearm swelling and pain. There was concern for a clotted fistula. Left upper extremity Doppler was negative. He was started on Vanc and cefepime for possible cellulitis. This has been de-escalated to cefazolin and patient is currently improved antibiotics. Completed 7 day course.  - Discontinue Cefazolin   Chronic Atrial fibrillation Patient had an episode of A. Fib with RVR in hemodialysis 9/26 while inpatient. Patient was started on warfarin amiodarone and metoprolol. Patient is currently on metoprolol and heparin (switched from warfarin in anticipation of cardiac cath. - Heparin - Metorprolol  ESRD on HD MWFS - Nephrology following - Midodrine  Right 4th digit pain and swelling Suspect gout flair. Patient states this is similar to previous flairs. Resolved with 5d course of Prednisone.   DM  On 10 units Levemir qhsat home, currently has run out.  - SSI  Elevated ALP Patient has persistent elevated ALP. Abdominal ultrasound in the past has revealed gallbladder wall thickening. He also has persistent elevation in bilirubin. Based on imaging in the past including CT scan and ultrasound of abdomen there  is suggestion of liver cirrhosis.  Patient has currently multiple medical issues and once he has improved would consider repeat abdominal ultrasound for further evaluation.   Dispo: Anticipated discharge 1-3 day(s).   Neva Seat, MD 08/18/2017, 7:06 AM Pager: [phone]

## 2017-08-19 ENCOUNTER — Encounter (HOSPITAL_COMMUNITY): Payer: Self-pay | Admitting: Cardiology

## 2017-08-19 ENCOUNTER — Inpatient Hospital Stay (HOSPITAL_COMMUNITY)
Admission: EM | Disposition: A | Discharge status: Home / Self Care | Payer: Self-pay | Source: Home / Self Care | Attending: Internal Medicine

## 2017-08-19 DIAGNOSIS — I272 Pulmonary hypertension, unspecified: Secondary | ICD-10-CM | POA: Diagnosis present

## 2017-08-19 HISTORY — PX: RIGHT/LEFT HEART CATH AND CORONARY ANGIOGRAPHY: CATH118266

## 2017-08-19 LAB — CBC
HCT: 31.6 % — ABNORMAL LOW (ref 39.0–52.0)
HEMATOCRIT: 32.8 % — AB (ref 39.0–52.0)
HEMOGLOBIN: 10.4 g/dL — AB (ref 13.0–17.0)
Hemoglobin: 10.3 g/dL — ABNORMAL LOW (ref 13.0–17.0)
MCH: 26.9 pg (ref 26.0–34.0)
MCH: 26.5 pg (ref 26.0–34.0)
MCHC: 32.6 g/dL (ref 30.0–36.0)
MCHC: 31.7 g/dL (ref 30.0–36.0)
MCV: 82.5 fL (ref 78.0–100.0)
MCV: 83.5 fL (ref 78.0–100.0)
Platelets: 260 10*3/uL (ref 150–400)
Platelets: 234 10*3/uL (ref 150–400)
RBC: 3.83 MIL/uL — ABNORMAL LOW (ref 4.22–5.81)
RBC: 3.93 MIL/uL — AB (ref 4.22–5.81)
RDW: 18.7 % — ABNORMAL HIGH (ref 11.5–15.5)
RDW: 18.4 % — ABNORMAL HIGH (ref 11.5–15.5)
WBC: 7.4 10*3/uL (ref 4.0–10.5)
WBC: 6.6 10*3/uL (ref 4.0–10.5)

## 2017-08-19 LAB — POCT I-STAT 3, ART BLOOD GAS (G3+)
Acid-Base Excess: 4 mmol/L — ABNORMAL HIGH (ref 0.0–2.0)
BICARBONATE: 29.2 mmol/L — AB (ref 20.0–28.0)
O2 Saturation: 97 %
PCO2 ART: 43.9 mmHg (ref 32.0–48.0)
PH ART: 7.431 (ref 7.350–7.450)
PO2 ART: 91 mmHg (ref 83.0–108.0)
TCO2: 31 mmol/L (ref 22–32)

## 2017-08-19 LAB — POCT I-STAT 3, VENOUS BLOOD GAS (G3P V)
Acid-Base Excess: 4 mmol/L — ABNORMAL HIGH (ref 0.0–2.0)
Acid-Base Excess: 5 mmol/L — ABNORMAL HIGH (ref 0.0–2.0)
BICARBONATE: 30.5 mmol/L — AB (ref 20.0–28.0)
Bicarbonate: 29.8 mmol/L — ABNORMAL HIGH (ref 20.0–28.0)
O2 SAT: 67 %
O2 Saturation: 66 %
PCO2 VEN: 48.4 mmHg (ref 44.0–60.0)
PCO2 VEN: 49.6 mmHg (ref 44.0–60.0)
PH VEN: 7.397 (ref 7.250–7.430)
PO2 VEN: 35 mmHg (ref 32.0–45.0)
TCO2: 31 mmol/L (ref 22–32)
TCO2: 32 mmol/L (ref 22–32)
pH, Ven: 7.398 (ref 7.250–7.430)
pO2, Ven: 35 mmHg (ref 32.0–45.0)

## 2017-08-19 LAB — RENAL FUNCTION PANEL
ALBUMIN: 2.8 g/dL — AB (ref 3.5–5.0)
Anion gap: 10 (ref 5–15)
BUN: 22 mg/dL — AB (ref 6–20)
CHLORIDE: 97 mmol/L — AB (ref 101–111)
CO2: 24 mmol/L (ref 22–32)
CREATININE: 3.38 mg/dL — AB (ref 0.61–1.24)
Calcium: 8.4 mg/dL — ABNORMAL LOW (ref 8.9–10.3)
GFR, EST AFRICAN AMERICAN: 22 mL/min — AB (ref >60)
GFR, EST NON AFRICAN AMERICAN: 19 mL/min — AB (ref >60)
Glucose, Bld: 127 mg/dL — ABNORMAL HIGH (ref 65–99)
PHOSPHORUS: 3.9 mg/dL (ref 2.5–4.6)
POTASSIUM: 3.9 mmol/L (ref 3.5–5.1)
Sodium: 131 mmol/L — ABNORMAL LOW (ref 135–145)

## 2017-08-19 LAB — GLUCOSE, CAPILLARY
GLUCOSE-CAPILLARY: 80 mg/dL (ref 65–99)
GLUCOSE-CAPILLARY: 105 mg/dL — AB (ref 65–99)
GLUCOSE-CAPILLARY: 73 mg/dL (ref 65–99)
Glucose-Capillary: 67 mg/dL (ref 65–99)
Glucose-Capillary: 67 mg/dL (ref 65–99)
Glucose-Capillary: 97 mg/dL (ref 65–99)

## 2017-08-19 LAB — POCT ACTIVATED CLOTTING TIME: ACTIVATED CLOTTING TIME: 158 s

## 2017-08-19 LAB — PROTIME-INR
INR: 1.56
Prothrombin Time: 18.5 seconds — ABNORMAL HIGH (ref 11.4–15.2)

## 2017-08-19 LAB — HEPARIN LEVEL (UNFRACTIONATED): Heparin Unfractionated: <0.1 IU/mL — ABNORMAL LOW (ref 0.30–0.70)

## 2017-08-19 SURGERY — RIGHT/LEFT HEART CATH AND CORONARY ANGIOGRAPHY
Anesthesia: LOCAL

## 2017-08-19 MED ORDER — SODIUM CHLORIDE 0.9% FLUSH: 3.0000 mL | INTRAVENOUS | Status: DC | PRN

## 2017-08-19 MED ORDER — SODIUM CHLORIDE 0.9 % IV SOLN: INTRAVENOUS | Status: DC

## 2017-08-19 MED ORDER — WARFARIN SODIUM 2 MG PO TABS
3.0000 mg | ORAL_TABLET | Freq: Once | ORAL | Status: AC
  Administered 2017-08-19: 3 mg via ORAL
  Filled 2017-08-19: qty 1

## 2017-08-19 MED ORDER — HEPARIN (PORCINE) IN NACL 2-0.9 UNIT/ML-% IJ SOLN
INTRAMUSCULAR | Status: AC
Start: 2017-08-19 — End: ?
  Filled 2017-08-19: qty 1000

## 2017-08-19 MED ORDER — LIDOCAINE HCL (PF) 1 % IJ SOLN: 5.0000 mL | INTRAMUSCULAR | Status: DC | PRN

## 2017-08-19 MED ORDER — SODIUM CHLORIDE 0.9 % IV SOLN: 250.0000 mL | INTRAVENOUS | Status: DC | PRN

## 2017-08-19 MED ORDER — MIDAZOLAM HCL 2 MG/2ML IJ SOLN
INTRAMUSCULAR | Status: AC
  Filled 2017-08-19: qty 2

## 2017-08-19 MED ORDER — HEPARIN (PORCINE) IN NACL 100-0.45 UNIT/ML-% IJ SOLN
1800.0000 [IU]/h | INTRAMUSCULAR | Status: DC
  Administered 2017-08-19 – 2017-08-21 (×3): 1350 [IU]/h via INTRAVENOUS
  Administered 2017-08-22: 1400 [IU]/h via INTRAVENOUS
  Filled 2017-08-19 (×5): qty 250

## 2017-08-19 MED ORDER — SODIUM CHLORIDE 0.9% FLUSH
3.0000 mL | Freq: Two times a day (BID) | INTRAVENOUS | Status: DC
  Administered 2017-08-19 – 2017-08-25 (×11): 3 mL via INTRAVENOUS

## 2017-08-19 MED ORDER — MIDAZOLAM HCL 2 MG/2ML IJ SOLN
INTRAMUSCULAR | Status: DC | PRN
  Administered 2017-08-19: 1 mg via INTRAVENOUS

## 2017-08-19 MED ORDER — WARFARIN - PHARMACIST DOSING INPATIENT: Freq: Every day | Status: DC

## 2017-08-19 MED ORDER — SODIUM CHLORIDE 0.9 % IV SOLN: 100.0000 mL | INTRAVENOUS | Status: DC | PRN

## 2017-08-19 MED ORDER — DARBEPOETIN ALFA 100 MCG/0.5ML IJ SOSY
100.0000 ug | PREFILLED_SYRINGE | INTRAMUSCULAR | Status: DC
  Administered 2017-08-19 – 2017-08-26 (×2): 100 ug via INTRAVENOUS
  Filled 2017-08-19 (×2): qty 0.5

## 2017-08-19 MED ORDER — LIDOCAINE-PRILOCAINE 2.5-2.5 % EX CREA
1.0000 "application " | TOPICAL_CREAM | CUTANEOUS | Status: DC | PRN
  Filled 2017-08-19: qty 5

## 2017-08-19 MED ORDER — PRO-STAT SUGAR FREE PO LIQD
30.0000 mL | Freq: Two times a day (BID) | ORAL | Status: DC
  Administered 2017-08-19 – 2017-08-25 (×11): 30 mL via ORAL
  Filled 2017-08-19 (×13): qty 30

## 2017-08-19 MED ORDER — HEPARIN (PORCINE) IN NACL 2-0.9 UNIT/ML-% IJ SOLN
INTRAMUSCULAR | Status: AC | PRN
  Administered 2017-08-19: 1000 mL

## 2017-08-19 MED ORDER — ALTEPLASE 2 MG IJ SOLR: 2.0000 mg | Freq: Once | INTRAMUSCULAR | Status: DC | PRN

## 2017-08-19 MED ORDER — IOPAMIDOL (ISOVUE-370) INJECTION 76%
INTRAVENOUS | Status: AC
  Filled 2017-08-19: qty 100

## 2017-08-19 MED ORDER — DARBEPOETIN ALFA 100 MCG/0.5ML IJ SOSY
PREFILLED_SYRINGE | INTRAMUSCULAR | Status: AC
  Administered 2017-08-19: 100 ug via INTRAVENOUS
  Filled 2017-08-19: qty 0.5

## 2017-08-19 MED ORDER — LIDOCAINE HCL 2 % IJ SOLN
INTRAMUSCULAR | Status: AC
  Filled 2017-08-19: qty 10

## 2017-08-19 MED ORDER — MORPHINE SULFATE (PF) 4 MG/ML IV SOLN
INTRAVENOUS | Status: AC
  Administered 2017-08-19: 2 mg
  Filled 2017-08-19: qty 1

## 2017-08-19 MED ORDER — SODIUM CHLORIDE 0.9 % IV BOLUS (SEPSIS): 250.0000 mL | Freq: Once | INTRAVENOUS | Status: DC | PRN

## 2017-08-19 MED ORDER — SODIUM CHLORIDE 0.9% FLUSH: 3.0000 mL | Freq: Two times a day (BID) | INTRAVENOUS | Status: DC

## 2017-08-19 MED ORDER — HEPARIN SODIUM (PORCINE) 1000 UNIT/ML DIALYSIS
1000.0000 [IU] | INTRAMUSCULAR | Status: DC | PRN
  Filled 2017-08-19: qty 1

## 2017-08-19 MED ORDER — ACETAMINOPHEN 325 MG PO TABS
650.0000 mg | ORAL_TABLET | Freq: Four times a day (QID) | ORAL | Status: DC | PRN
  Administered 2017-08-19 – 2017-08-25 (×4): 650 mg via ORAL
  Filled 2017-08-19 (×4): qty 2

## 2017-08-19 MED ORDER — IOPAMIDOL (ISOVUE-370) INJECTION 76%
INTRAVENOUS | Status: DC | PRN
  Administered 2017-08-19: 90 mL via INTRA_ARTERIAL

## 2017-08-19 MED ORDER — HEPARIN SODIUM (PORCINE) 1000 UNIT/ML DIALYSIS
20.0000 [IU]/kg | INTRAMUSCULAR | Status: DC | PRN
  Filled 2017-08-19: qty 2

## 2017-08-19 MED ORDER — CEFAZOLIN SODIUM-DEXTROSE 1-4 GM/50ML-% IV SOLN
1.0000 g | INTRAVENOUS | Status: DC
  Administered 2017-08-19 – 2017-08-24 (×6): 1 g via INTRAVENOUS
  Filled 2017-08-19 (×7): qty 50

## 2017-08-19 MED ORDER — SODIUM CHLORIDE 0.9 % IV SOLN
INTRAVENOUS | Status: DC
  Administered 2017-08-19: 06:00:00 via INTRAVENOUS

## 2017-08-19 MED ORDER — ASPIRIN 81 MG PO CHEW
81.0000 mg | CHEWABLE_TABLET | Freq: Once | ORAL | Status: AC
  Administered 2017-08-19: 81 mg via ORAL
  Filled 2017-08-19: qty 1

## 2017-08-19 MED ORDER — PENTAFLUOROPROP-TETRAFLUOROETH EX AERO: 1.0000 "application " | INHALATION_SPRAY | CUTANEOUS | Status: DC | PRN

## 2017-08-19 SURGICAL SUPPLY — 10 items
CATH INFINITI 5FR MULTPACK ANG (CATHETERS) ×1 IMPLANT
CATH SWAN GANZ 7F STRAIGHT (CATHETERS) ×1 IMPLANT
KIT HEART LEFT (KITS) ×2 IMPLANT
KIT MICROINTRODUCER STIFF 5F (SHEATH) ×1 IMPLANT
PACK CARDIAC CATHETERIZATION (CUSTOM PROCEDURE TRAY) ×2 IMPLANT
SHEATH PINNACLE 5F 10CM (SHEATH) ×1 IMPLANT
SHEATH PINNACLE 7F 10CM (SHEATH) ×1 IMPLANT
TRANSDUCER W/STOPCOCK (MISCELLANEOUS) ×2 IMPLANT
TUBING CIL FLEX 10 FLL-RA (TUBING) ×2 IMPLANT
WIRE EMERALD 3MM-J .035X150CM (WIRE) ×1 IMPLANT

## 2017-08-19 NOTE — Progress Notes (Addendum)
Asked by bedside RN to assess patient for fevers.  Patient returned from HD, + chills/rigor, + temp, + aches, says he doesn't feel well.    Left arm + erythema, swollen, + pulse.  Lung sounds clear in the upper, diminished in the lowers, HR low 90-100s AF, saturations maintaining.  Plan:   --Treat fever with APAP, just received dose at 1700- -- Treat pain with PRN pain relief, instructed RNs to call primary service to update on patient's status. -- 2100 RNS informed me that SBP was in the lowe 60-70s, MAPs in the 50s, RNs paged IMTS, will started 250 NS bolus per RRT orders. -- Hold Lopressor and MDs to order ABX  RRT Interventions: -- 2100: 250 NS bolus started.    Will follow as needed

## 2017-08-19 NOTE — Progress Notes (Deleted)
[facility] Progress Note  Subjective: 2 L off with HD yest and 1.8 L off with HD today.  Had to turn off UF temp for chest pain.    Vitals:   08/19/17 1005 08/19/17 1010 08/19/17 1015 08/19/17 1020  BP: 108/66 95/63 93/61  106/66  Pulse: 85 69 75 77  Resp: 16 17 13 14   Temp:      TempSrc:      SpO2: 100% 100% 99% 100%  Weight:      Height:        Inpatient medications: . allopurinol  150 mg Oral Daily  . atorvastatin  20 mg Oral Daily  . calcium acetate  667 mg Oral TID WC  . diclofenac sodium  4 g Topical QID  . insulin aspart  0-5 Units Subcutaneous QHS  . insulin aspart  0-9 Units Subcutaneous TID WC  . metoprolol succinate  25 mg Oral BID  . midodrine  10 mg Oral TID WC  . pantoprazole  40 mg Oral Daily  . sodium chloride flush  3 mL Intravenous Q12H  . warfarin  3 mg Oral ONCE-1800  . Warfarin - Pharmacist Dosing Inpatient   Does not apply q1800   . sodium chloride    . sodium chloride    . sodium chloride 10 mL/hr at 08/19/17 0613  . sodium chloride    . ferric gluconate (FERRLECIT/NULECIT) IV Stopped (08/17/17 2200)  . heparin     sodium chloride, sodium chloride, sodium chloride, alteplase, heparin, heparin, HYDROcodone-acetaminophen, lidocaine (PF), lidocaine-prilocaine, morphine injection, pentafluoroprop-tetrafluoroeth, senna-docusate, sodium chloride flush  Exam: Alert, nasal O2, no distress +JVD Chest clear bilat RRR Abd obese, ntnd Ext diffuse 2-3+ pitting edema x 4 ext NF, Ox 3  CXR - 9/24, R sided opacities  Dialysis: [facility] 4h  75kg   2/2.5  TDC R / maturing LUA AVF  Hep 4200 -venofer 50/wk -mircera 200 q 2, last 9.19      Impression: 1. Cardiac arrest - unclear cause, heart cath w/o severe disease. LVEF 50-55% , past hx of CM w EF 30% but appears to have improved.   2. Anasarca - gaining wt in hospital despite daily, low UF dialysis. Cont daily HD for now.  3. Hypotension - chronic issue, on midodrine tid 4. SP L arm AVF  (Aug '18) 5. SP MVR and TV repair 6. Hx CM w recovered LVEF 7. Pulm HTN 8. Permanent afib - per cards  Plan - as above   Kelly Splinter MD [facility] pager [phone]   08/19/2017, 1:26 PM    Recent Labs Lab 08/14/17 0715 08/16/17 0442 08/17/17 0442 08/19/17 1133  NA 131* 131* 130* 131*  K 4.3 4.5 4.7 3.9  CL 97* 94* 97* 97*  CO2 28 26 25 24   GLUCOSE 107* 92 104* 127*  BUN 23* 20 27* 22*  CREATININE 4.29* 3.61* 3.80* 3.38*  CALCIUM 8.6* 8.8* 8.7* 8.4*  PHOS 4.4 3.9  --  3.9    Recent Labs Lab 08/14/17 0715 08/16/17 0442 08/19/17 1133  AST  --  26  --   ALT  --  12*  --   ALKPHOS  --  175*  --   BILITOT  --  1.5*  --   PROT  --  6.9  --   ALBUMIN 2.7* 3.0* 2.8*    Recent Labs Lab 08/17/17 0442 08/19/17 0230 08/19/17 1133  WBC 8.9 7.4 6.6  HGB 11.1* 10.3* 10.4*  HCT 34.6* 31.6* 32.8*  MCV 82.2 82.5 83.5  PLT 268 260 234   Iron/TIBC/Ferritin/ %Sat    Component Value Date/Time   IRON 55 07/14/2017 0506   TIBC 392 07/14/2017 0506   FERRITIN 265 07/14/2017 0506   IRONPCTSAT 14 (L) 07/14/2017 7680

## 2017-08-19 NOTE — Progress Notes (Signed)
Pharmacy Antibiotic Note  Michael [name] is a 56 y.o. male admitted on 08/10/2017 with bacteremia.  Pharmacy has been consulted for cefazolin dosing. Patient with chills/rigor and fever with HD today. WBC wnl.   Plan: -Start cefazolin 1 gm IV Q 24 hours -Monitor CBC, cultures and clinical progress   Height: 5\' 5"  (165.1 cm) Weight: 164 lb 7.4 oz (74.6 kg) IBW/kg (Calculated) : 61.5  Temp (24hrs), Avg:99.9 F (37.7 C), Min:98.1 F (36.7 C), Max:103.3 F (39.6 C)   Recent Labs Lab 08/13/17 0546 08/14/17 0715 08/16/17 0442 08/17/17 0442 08/19/17 0230 08/19/17 1133  WBC 5.5 6.9 5.9 8.9 7.4 6.6  CREATININE 3.39* 4.29* 3.61* 3.80*  --  3.38*    Estimated Creatinine Clearance: 22.7 mL/min (A) (by C-G formula based on SCr of 3.38 mg/dL (H)).    No Known Allergies  Antimicrobials this admission: 10/3 Cefazolin>>   Dose adjustments this admission: None   Microbiology results: 10/3 BCx: >>   Thank you for allowing pharmacy to be a part of this patient's care.  Albertina Parr, PharmD., BCPS Clinical Pharmacist Pager [phone]

## 2017-08-19 NOTE — H&P (View-Only) (Signed)
DAILY PROGRESS NOTE   Patient Name: Michael [name] Date of Encounter: 08/18/2017  Hospital Problem List   Principal Problem:   Syncope and collapse Active Problems:   DM type 2 (diabetes mellitus, type 2) (HCC)   Permanent atrial fibrillation (HCC)   ESRD (end stage renal disease) on dialysis [facility])   Chest pain    Chief Complaint   More pain overnight, "I hurt everywhere"  Subjective   Seen on dialysis today. INR 2.1 this morning. Has been off warfarin. Will plan to start heparin when INR <2 per pharmacy. Possibly for cath tomorrow. Pain is more atypical now - reproducible chest wall pain noted today.  Objective   Vitals:   08/18/17 0421 08/18/17 0711 08/18/17 0730 08/18/17 0800  BP: 132/81 118/70 104/67 113/61  Pulse: 70 77 73 76  Resp: '18 14 19 15  ' Temp: 97.8 F (36.6 C) 97.7 F (36.5 C)    TempSrc: Oral Oral    SpO2: 100% 100%    Weight: 173 lb 3.2 oz (78.6 kg) 178 lb 9.2 oz (81 kg)    Height:        Intake/Output Summary (Last 24 hours) at 08/18/17 5929 Last data filed at 08/17/17 2345  Gross per 24 hour  Intake              690 ml  Output             2120 ml  Net            -1430 ml   Filed Weights   08/17/17 2200 08/18/17 0421 08/18/17 0711  Weight: 172 lb 13.5 oz (78.4 kg) 173 lb 3.2 oz (78.6 kg) 178 lb 9.2 oz (81 kg)    Physical Exam   General appearance: alert and no distress Neck: no carotid bruit, no JVD and thyroid not enlarged, symmetric, no tenderness/mass/nodules Lungs: clear to auscultation bilaterally Heart: irregularly irregular rhythm Abdomen: soft, non-tender; bowel sounds normal; no masses,  no organomegaly Extremities: extremities normal, atraumatic, no cyanosis or edema Pulses: 2+ and symmetric Skin: Skin color, texture, turgor normal. No rashes or lesions Neurologic: Grossly normal Psych: Appears uncomfortable  Inpatient Medications    Scheduled Meds: . allopurinol  150 mg Oral Daily  . atorvastatin  20 mg Oral Daily   . calcium acetate  667 mg Oral TID WC  . diclofenac sodium  4 g Topical QID  . [START ON 08/19/2017] heparin  4,000 Units Dialysis Once in dialysis  . insulin aspart  0-5 Units Subcutaneous QHS  . insulin aspart  0-9 Units Subcutaneous TID WC  . lidocaine  1 patch Transdermal Q24H  . metoprolol succinate  25 mg Oral BID  . midodrine      . midodrine  10 mg Oral TID WC  . pantoprazole  40 mg Oral Daily  . sodium chloride flush  3 mL Intravenous Q12H    Continuous Infusions: . sodium chloride    . sodium chloride    .  ceFAZolin (ANCEF) IV Stopped (08/17/17 2327)  . ferric gluconate (FERRLECIT/NULECIT) IV Stopped (08/17/17 2200)    PRN Meds: sodium chloride, sodium chloride, alteplase, heparin, HYDROcodone-acetaminophen, lidocaine (PF), lidocaine-prilocaine, morphine injection, pentafluoroprop-tetrafluoroeth, senna-docusate, sodium chloride flush   Labs   Results for orders placed or performed during the hospital encounter of 08/10/17 (from the past 48 hour(s))  Glucose, capillary     Status: Abnormal   Collection Time: 08/16/17 11:18 AM  Result Value Ref Range   Glucose-Capillary 102 (H) 65 -  99 mg/dL  Glucose, capillary     Status: Abnormal   Collection Time: 08/16/17  4:02 PM  Result Value Ref Range   Glucose-Capillary 143 (H) 65 - 99 mg/dL  Glucose, capillary     Status: Abnormal   Collection Time: 08/16/17  8:37 PM  Result Value Ref Range   Glucose-Capillary 125 (H) 65 - 99 mg/dL  Protime-INR     Status: Abnormal   Collection Time: 08/17/17  4:42 AM  Result Value Ref Range   Prothrombin Time 28.2 (H) 11.4 - 15.2 seconds   INR 1.85   Basic metabolic panel     Status: Abnormal   Collection Time: 08/17/17  4:42 AM  Result Value Ref Range   Sodium 130 (L) 135 - 145 mmol/L   Potassium 4.7 3.5 - 5.1 mmol/L   Chloride 97 (L) 101 - 111 mmol/L   CO2 25 22 - 32 mmol/L   Glucose, Bld 104 (H) 65 - 99 mg/dL   BUN 27 (H) 6 - 20 mg/dL   Creatinine, Ser 3.80 (H) 0.61 - 1.24  mg/dL   Calcium 8.7 (L) 8.9 - 10.3 mg/dL   GFR calc non Af Amer 16 (L) >60 mL/min   GFR calc Af Amer 19 (L) >60 mL/min    Comment: (NOTE) The eGFR has been calculated using the CKD EPI equation. This calculation has not been validated in all clinical situations. eGFR's persistently <60 mL/min signify possible Chronic Kidney Disease.    Anion gap 8 5 - 15  CBC     Status: Abnormal   Collection Time: 08/17/17  4:42 AM  Result Value Ref Range   WBC 8.9 4.0 - 10.5 K/uL   RBC 4.21 (L) 4.22 - 5.81 MIL/uL   Hemoglobin 11.1 (L) 13.0 - 17.0 g/dL   HCT 34.6 (L) 39.0 - 52.0 %   MCV 82.2 78.0 - 100.0 fL   MCH 26.4 26.0 - 34.0 pg   MCHC 32.1 30.0 - 36.0 g/dL   RDW 18.3 (H) 11.5 - 15.5 %   Platelets 268 150 - 400 K/uL  Glucose, capillary     Status: None   Collection Time: 08/17/17  7:22 AM  Result Value Ref Range   Glucose-Capillary 82 65 - 99 mg/dL  Glucose, capillary     Status: Abnormal   Collection Time: 08/17/17 11:15 AM  Result Value Ref Range   Glucose-Capillary 107 (H) 65 - 99 mg/dL  Glucose, capillary     Status: Abnormal   Collection Time: 08/17/17  3:57 PM  Result Value Ref Range   Glucose-Capillary 100 (H) 65 - 99 mg/dL  Glucose, capillary     Status: Abnormal   Collection Time: 08/17/17 11:02 PM  Result Value Ref Range   Glucose-Capillary 133 (H) 65 - 99 mg/dL  Protime-INR     Status: Abnormal   Collection Time: 08/18/17  4:03 AM  Result Value Ref Range   Prothrombin Time 23.4 (H) 11.4 - 15.2 seconds   INR 2.10     ECG   N/A  Telemetry   A-fib - Personally Reviewed  Radiology    No results found.  Cardiac Studies   N/A  Assessment   1. Principal Problem: 2.   Syncope and collapse 3. Active Problems: 4.   DM type 2 (diabetes mellitus, type 2) ([place]) 5.   Permanent atrial fibrillation ([place]) 6.   ESRD (end stage renal disease) on dialysis ([place]) 7.   Chest pain 8.   Plan   1.  Plan for [facility] tomorrow - pain is atypical, however, there may be  concomitant CAD as well. INR too high today for cath - would be ideal to be below 1.7 for femoral approach. Will give additional 2.5 mg Vit K today. Check INR this afternoon and start heparin per pharmacy when INR <2. Keep NPO p MN.  Time Spent Directly with Patient:  I have spent a total of 15 minutes with the patient reviewing hospital notes, telemetry, EKGs, labs and examining the patient as well as establishing an assessment and plan that was discussed personally with the patient. > 50% of time was spent in direct patient care.  Length of Stay:  LOS: 8 days   Pixie Casino, MD, [place]  Attending Cardiologist  Direct Dial: [phone]  Fax: [phone]  Website:  [email or web address] 08/18/2017, 9:38 AM

## 2017-08-19 NOTE — Progress Notes (Signed)
ANTICOAGULATION CONSULT NOTE  Pharmacy Consult to Heparin / Warfarin Indication: atrial fibrillation   No Known Allergies  Vital Signs: Temp: 98.5 F (36.9 C) (10/03 0700) Temp Source: Oral (10/03 0700) BP: 106/66 (10/03 1020) Pulse Rate: 77 (10/03 1020)  Labs:  Recent Labs  08/17/17 0442 08/18/17 0403 08/18/17 1533 08/19/17 0230  HGB 11.1*  --   --  10.3*  HCT 34.6*  --   --  31.6*  PLT 268  --   --  260  LABPROT 28.2* 23.4* 19.9* 18.5*  INR 2.67 2.10 1.71 1.56  HEPARINUNFRC  --   --   --  <0.10*  CREATININE 3.80*  --   --   --     Estimated Creatinine Clearance: 20.9 mL/min (A) (by C-G formula based on SCr of 3.8 mg/dL (H)).  Assessment: 56yom with suspected cardiac arrest at HD center. On coumadin pta for afib/bioprosthetic MVR. S/p cath, now resuming heparin and warfarin  Home dose: 2.5mg  daily  Goal of Therapy:  INR 2-3  Heparin level 0.3-0.7  Monitor platelets by anticoagulation protocol: Yes   Plan:  Heparin at 1350 units / hr starting at 1730 pm Warfarin 3 mg po x 1 at 1800 pm Daily heparin level, CBC, INR  Thank you Anette Guarneri, PharmD [phone] 08/19/17 12:26 PM

## 2017-08-19 NOTE — Progress Notes (Signed)
Pt with 103.3 temp, lethargic, awakes to voice but quickly falls back asleep, left arm with increased edema 3 +, MD aware, blood cultures ordered, tylenol prn given, ice packs to axillary bilateral.  [name] RN

## 2017-08-19 NOTE — Progress Notes (Signed)
[facility] Progress Note   Subjective:  Seen on HD. Drowsy s/p LHC procedure earlier this morning. Still with edema and requiring nasal oxygen. 3L UF goal planned.   Objective Vitals:   08/19/17 1005 08/19/17 1010 08/19/17 1015 08/19/17 1020  BP: 108/66 95/63 93/61  106/66  Pulse: 85 69 75 77  Resp: 16 17 13 14   Temp:      TempSrc:      SpO2: 100% 100% 99% 100%  Weight:      Height:       Physical Exam General: Well appearing male. NAD, drowsy. On nasal oxygen. Heart: RRR, no murmur Lungs: CTA anteriorly Abdomen: Soft, non-tender Extremities: 2+ pitting edema in posterior thighs Dialysis Access: TDC in R chest, no erythema. LUE 1st stage AVF maturing (placed 8/27).  Additional Objective Labs: Basic Metabolic Panel:  Recent Labs Lab 08/14/17 0715 08/16/17 0442 08/17/17 0442 08/19/17 1133  NA 131* 131* 130* 131*  K 4.3 4.5 4.7 3.9  CL 97* 94* 97* 97*  CO2 28 26 25 24   GLUCOSE 107* 92 104* 127*  BUN 23* 20 27* 22*  CREATININE 4.29* 3.61* 3.80* 3.38*  CALCIUM 8.6* 8.8* 8.7* 8.4*  PHOS 4.4 3.9  --  3.9   Liver Function Tests:  Recent Labs Lab 08/14/17 0715 08/16/17 0442 08/19/17 1133  AST  --  26  --   ALT  --  12*  --   ALKPHOS  --  175*  --   BILITOT  --  1.5*  --   PROT  --  6.9  --   ALBUMIN 2.7* 3.0* 2.8*   CBC:  Recent Labs Lab 08/14/17 0715 08/16/17 0442 08/17/17 0442 08/19/17 0230 08/19/17 1133  WBC 6.9 5.9 8.9 7.4 6.6  HGB 10.2* 11.1* 11.1* 10.3* 10.4*  HCT 32.1* 34.8* 34.6* 31.6* 32.8*  MCV 83.8 83.5 82.2 82.5 83.5  PLT 150 243 268 260 234   CBG:  Recent Labs Lab 08/18/17 2130 08/19/17 0734 08/19/17 0958 08/19/17 1023 08/19/17 1210  GLUCAP 120* 80 67 67 105*   Studies/Results: No results found. Medications: . sodium chloride    . sodium chloride    . sodium chloride 10 mL/hr at 08/19/17 0613  . sodium chloride    . ferric gluconate (FERRLECIT/NULECIT) IV Stopped (08/17/17 2200)  . heparin     .  allopurinol  150 mg Oral Daily  . atorvastatin  20 mg Oral Daily  . calcium acetate  667 mg Oral TID WC  . diclofenac sodium  4 g Topical QID  . insulin aspart  0-5 Units Subcutaneous QHS  . insulin aspart  0-9 Units Subcutaneous TID WC  . metoprolol succinate  25 mg Oral BID  . midodrine  10 mg Oral TID WC  . pantoprazole  40 mg Oral Daily  . sodium chloride flush  3 mL Intravenous Q12H  . warfarin  3 mg Oral ONCE-1800  . Warfarin - Pharmacist Dosing Inpatient   Does not apply q1800    Dialysis Orders: [facility] at [facility] 4hr, BFR 400, DFR 800, EDW 75kg, 2K/2.25Ca, TDC R / maturing LUA AVF, Hep 4200 - Venofer 50/wk - Mircera 200 q 2 weeks (last given 9/19)  Assessment/Plan: 1. Afib with RVR: On metoprolol BID, resuming warfarin 10/3 evening. 2. CAD (+ nuclear stress test): Now s/p right and left HC 10/3 showing 100% stenosed RPDA lesion, + collaterals to distal vessel, no amenable PCI target. Otherwise minimal CAD. + Pulm HTN with elevated LV pressures. 2.  ESRD: Continue HD per MWFS schedule (on 4x/week due to low BPs). 3L UF planned. 3. HypoTN/volume: BP chronically low, on midodrine 10mg  TID. Still edematous, increasing UF as tolerated. 4. Anemia: Hgb 10.4. Resume Aranesp today (lowering dose to 190mcg) 5. Secondary hyperparathyroidism: Labs ok. Continue Phoslo as binder. 6. Nutrition: Alb low, starting Pro-stat. 7. Cardiomyopathy (EF 50-55% per 9/27 Echo) 8. S/p MVR and TV repair 9. Hx cardiac arrest (9/24 at HD center): Brief CPR before ROSC.   Veneta Penton, PA-C 08/19/2017, 1:18 PM  [employer] Pager: [phone]  Pt seen, examined and agree w A/P as above.  Kelly Splinter MD [employer] pager [phone]   08/20/2017, 11:20 AM

## 2017-08-19 NOTE — Progress Notes (Addendum)
Site area: rt groin fa and fv sheaths pulled Site Prior to Removal:  Level 0 Pressure Applied For: 20 minutes Manual:   yes Patient Status During Pull:  stable Post Pull Site:  Level 0 Post Pull Instructions Given:  Yes by translator Post Pull Pulses Present: palpable Dressing Applied:  Gauze and tegaderm Bedrest begins @ 1020 Comments: CBG 67. Skin warm and dry. Drank 4oz apple juice. Repeat CBG 67. Dr. Ellyn Hack made aware. Sending [place] sandwich back w/patient and will notify patients nurse on 6e

## 2017-08-19 NOTE — Plan of Care (Signed)
Problem: Physical Regulation: Goal: Ability to maintain clinical measurements within normal limits will improve Outcome: Progressing Patient has continued complaints of knee pain along with chest pain. Relief noted with Norco. Patient prepped for cath this morning. Spoke with Cath RN regarding unresulting platelet counts. Team will discuss whether to give ASA.

## 2017-08-19 NOTE — Progress Notes (Signed)
Paged that patient was febrile with a temperature of 103F, lethargic, and hypotensive. Blood cultures just drawn at 22:50. Went to evaluate the patient and he was resting comfortably in bed and asymptomatic. Just got back for HD where 3L were removed. States that initially he had chills but now he is hot. Otherwise feeling well.   BP: 78/50 HR: 58 T: 103F  General: Alert, resting comfortably on 2L/min [place] Pulm: Inspiratory crackles  CV: Tachycardia, irregular rhythm, no murmurs or rubs  Extremities: Mild pitting edema    Plan: - Started on Cefazolin  - Received 250 mL bolus from rapid response nurse  - Limit fluid due to ESRD  - Tylenol for fevers - Hold evening BP meds  - Continue to monitor

## 2017-08-19 NOTE — Progress Notes (Signed)
Deary to start IV Heparin once INR <2  Indication: atrial fibrillation / ACS  No Known Allergies  Vital Signs: Temp: 98.8 F (37.1 C) (10/02 2332) Temp Source: Oral (10/02 2332) BP: 86/48 (10/02 2332) Pulse Rate: 91 (10/02 2332)  Labs:  Recent Labs  08/16/17 0442 08/17/17 0442 08/18/17 0403 08/18/17 1533 08/19/17 0230  HGB 11.1* 11.1*  --   --  10.3*  HCT 34.8* 34.6*  --   --  31.6*  PLT 243 268  --   --  260  LABPROT 30.3* 28.2* 23.4* 19.9* 18.5*  INR 2.92 2.67 2.10 1.71 1.56  HEPARINUNFRC  --   --   --   --  <0.10*  CREATININE 3.61* 3.80*  --   --   --     Estimated Creatinine Clearance: 20.8 mL/min (A) (by C-G formula based on SCr of 3.8 mg/dL (H)).  Assessment: 56yom with suspected cardiac arrest at HD center. On coumadin pta for afib/bioprosthetic MVR.  INR now <2 and heparin started per pharmacy while coumadin on hold in preparation for cath. Patient has also received vitamin k 2.5 mg on 10/1 and 10/2.   First heparin level undetectable with no infusion issues per RN. Heparin level previously therapeutic on 1700 units/hr on prior admission. Hgb low, but stable and plts wnl. No s/s bleeding noted.    Home dose: 2.5mg  daily  Goal of Therapy:  INR 2-3  Heparin level 0.3-0.7  Monitor platelets by anticoagulation protocol: Yes   Plan:  Increase heparin gtt to 1350 units/hr Heparin level in 8 hrs Daily heparin level and CBC Monitor for s/s bleeding F/u cath plans 10/3   Argie Ramming, PharmD Clinical Pharmacist 08/19/17 3:50 AM

## 2017-08-19 NOTE — Progress Notes (Signed)
  Date: 08/19/2017  Patient name: Michael [name]  Medical record number: [id number]  Date of birth: 1961-06-07   I have seen and evaluated this patient and I have discussed the plan of care with the house staff. Please see their note for complete details. I concur with their findings.  Oda Kilts, MD 08/19/2017, 6:20 PM

## 2017-08-19 NOTE — Progress Notes (Signed)
Subjective:  Patient was OTF this morning for cardiac catheterization. He was revisited in the afternoon following HD and was tachycardic and febrile. He was drowsy but able to answer questions if pressed. He denies any pain.  Objective:  Vital signs in last 24 hours: Vitals:   08/19/17 1005 08/19/17 1010 08/19/17 1015 08/19/17 1020  BP: 108/66 95/63 93/61  106/66  Pulse: 85 69 75 77  Resp: 16 17 13 14   Temp:      TempSrc:      SpO2: 100% 100% 99% 100%  Weight:      Height:       Physical Exam  Constitutional: He is oriented to person, place, and time and well-developed, well-nourished, and in no distress.  HENT:  Head: Normocephalic and atraumatic.  Eyes: EOM are normal. Right eye exhibits no discharge. Left eye exhibits no discharge.  Cardiovascular: Exam reveals no gallop and no friction rub.   No murmur heard. Tachycardic Irregularly irregular rhythm  Pulmonary/Chest: Effort normal and breath sounds normal. No respiratory distress. He has no wheezes. He has no rales.  Abdominal: Soft. He exhibits no distension. There is no tenderness.  Musculoskeletal: He exhibits no edema.  Mildly darker skin over left forearm, with increased edema  Neurological: He is alert and oriented to person, place, and time.  Skin: Skin is warm and dry.   Cardiac Cath: Conclusion:  - No PCI amenable target noted. There does appear to be potential occlusion of the distal PDA with left to right collaterals which would explain the patient's stress test results. However there was no stump visualized, and the distal vessel is too small for PCI. Furthermore, given the collateralization, this does not appear to be acute. -Recommend continued management.  Assessment/Plan:  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  Cardiac arrest/syncope  Patient presented to the ED after he became unresponsive, pulseless and apneic during his hemodialysis session. CPR was performed and patient became responsive and had ROSC. Cardiology is following this admission.  He had a Myoview that showed reversibility in mid inferior wall. EF greater than 50%. Cardiac cath on 10/1 was delayed to 10/3 due to elevated INR. Cath showed RPDA lesion, 100 %stenosed with collaterals filling the distal vessel (Not a PCI option & likely not acute); minimal CAD elsewhere; hemodynamic findings consistent with at least moderate pulmonary hypertension with mildly elevated LV pressures. - Appreciate cardiology recommendations - Heparin per pharmacy will bridge back to warfarin  Left upper extremity cellulitis During admission patient complained of increasing left forearm swelling and pain. There was concern for a clotted fistula. Left upper extremity Doppler was negative. He was started on Vanc and cefepime for possible cellulitis. This has been de-escalated to cefazolin and patient is currently improved antibiotics. Completed 7 day course.  Tachycardia and Fever (101.8, axillary) with increased warmth and edema in LUE (10/3). - Concern for Cellulitis vs Thrombophlebitis. Blood cultures ordered. - Reinitiate Cefazolin based on prior response, will broaden to Vancomycin if he spikes a fever overnight considering recent antibiotic exposure - Repeat LUE Venous duplex   Chronic Atrial fibrillation Patient had an episode of A. Fib with RVR in hemodialysis 9/26 while inpatient. Patient was started on warfarin amiodarone and metoprolol.  Patient is currently on metoprolol and heparin (switched from warfarin in anticipation of cardiac cath. - Heparin bridge to warfarin - Metorprolol  ESRD on HD MWFS - Nephrology following - Midodrine  Right 4th digit pain and swelling Suspect gout  flair. Patient states this is similar to previous flairs. Resolved with 5d course of Prednisone.   DM  On 10 units Levemir qhsat home, currently has run out.  - SSI  Elevated ALP Patient has persistent elevated ALP. Abdominal ultrasound in the past has revealed gallbladder wall thickening. He also has persistent elevation in bilirubin. Based on imaging in the past including CT scan and ultrasound of abdomen there is suggestion of liver cirrhosis.  Patient has currently multiple medical issues and once he has improved would consider repeat abdominal ultrasound for further evaluation.   Dispo: Anticipated discharge 2-5 day(s).  Neva Seat, MD 08/19/2017, 11:39 AM Pager: [phone]

## 2017-08-19 NOTE — Progress Notes (Signed)
Cath discussed with Dr. Ellyn Hack- no clear culprit vessel stenosis. There appeared to be a chronically occluded distal rPDA which is collateralized. No intervention is recommended. Would restart warfarin and heparin to goal INR 2-3.  Can follow-up with Dr. Harrington Challenger after discharge. No further cardiac suggestions. Will sign-off. Call with questions.  Pixie Casino, MD, [employer]  Attending Cardiologist  Direct Dial: [phone]  Fax: [phone]  Website:  [email or web address]

## 2017-08-19 NOTE — Interval H&P Note (Signed)
History and Physical Interval Note:  08/19/2017 8:34 AM  Michael [name]  has presented today for surgery, with the diagnosis of hypotension w/ unresponsivness, positive nuclear study (Presumed Cardiac Arrest).  The various methods of treatment have been discussed with the patient and family. After consideration of risks, benefits and other options for treatment, the patient has consented to  Procedure(s): RIGHT/LEFT HEART CATH AND CORONARY ANGIOGRAPHY (N/A) with possible PERCUTANEOUS CORONARY INTERVENTION as a surgical intervention .    The patient's history has been reviewed, patient examined, no change in status, stable for surgery.  I have reviewed the patient's chart and labs.  Questions were answered to the patient's satisfaction.    Cath Lab Visit (complete for each Cath Lab visit)  Clinical Evaluation Leading to the Procedure:   ACS: No.  Non-ACS:    Anginal Classification: No Symptoms  Anti-ischemic medical therapy: Minimal Therapy (1 class of medications)  Non-Invasive Test Results: Intermediate-risk stress test findings: cardiac mortality 1-3%/year  Prior CABG: No previous CABG; S/P MVR   Glenetta Hew

## 2017-08-19 NOTE — Progress Notes (Signed)
Pt heart noted 120-138 sustained; stopped tx with 5 mins left. Pt heart were 70's-80's  During HD tx , at the end heart rate increased. Reported to [name], RN on the floor; two MD's hospitalist were also informed of the increased heart rates.

## 2017-08-20 ENCOUNTER — Other Ambulatory Visit: Payer: Self-pay

## 2017-08-20 ENCOUNTER — Inpatient Hospital Stay (HOSPITAL_COMMUNITY): Payer: Medicare Other

## 2017-08-20 DIAGNOSIS — Z992 Dependence on renal dialysis: Principal | ICD-10-CM

## 2017-08-20 DIAGNOSIS — Z48812 Encounter for surgical aftercare following surgery on the circulatory system: Secondary | ICD-10-CM

## 2017-08-20 DIAGNOSIS — N186 End stage renal disease: Secondary | ICD-10-CM

## 2017-08-20 LAB — BASIC METABOLIC PANEL
Anion gap: 10 (ref 5–15)
BUN: 17 mg/dL (ref 6–20)
CO2: 28 mmol/L (ref 22–32)
Calcium: 8.3 mg/dL — ABNORMAL LOW (ref 8.9–10.3)
Chloride: 95 mmol/L — ABNORMAL LOW (ref 101–111)
Creatinine, Ser: 2.87 mg/dL — ABNORMAL HIGH (ref 0.61–1.24)
GFR calc Af Amer: 26 mL/min — ABNORMAL LOW (ref >60)
GFR calc non Af Amer: 23 mL/min — ABNORMAL LOW (ref >60)
Glucose, Bld: 132 mg/dL — ABNORMAL HIGH (ref 65–99)
Potassium: 3.7 mmol/L (ref 3.5–5.1)
Sodium: 133 mmol/L — ABNORMAL LOW (ref 135–145)

## 2017-08-20 LAB — GLUCOSE, CAPILLARY
GLUCOSE-CAPILLARY: 126 mg/dL — AB (ref 65–99)
GLUCOSE-CAPILLARY: 98 mg/dL (ref 65–99)
Glucose-Capillary: 96 mg/dL (ref 65–99)

## 2017-08-20 LAB — CBC
HCT: 30.6 % — ABNORMAL LOW (ref 39.0–52.0)
Hemoglobin: 9.8 g/dL — ABNORMAL LOW (ref 13.0–17.0)
MCH: 26.7 pg (ref 26.0–34.0)
MCHC: 32 g/dL (ref 30.0–36.0)
MCV: 83.4 fL (ref 78.0–100.0)
Platelets: 225 10*3/uL (ref 150–400)
RBC: 3.67 MIL/uL — ABNORMAL LOW (ref 4.22–5.81)
RDW: 18.8 % — ABNORMAL HIGH (ref 11.5–15.5)
WBC: 8.3 10*3/uL (ref 4.0–10.5)

## 2017-08-20 LAB — PROTIME-INR
INR: 1.55
Prothrombin Time: 18.5 seconds — ABNORMAL HIGH (ref 11.4–15.2)

## 2017-08-20 LAB — HEPARIN LEVEL (UNFRACTIONATED): Heparin Unfractionated: 0.45 IU/mL (ref 0.30–0.70)

## 2017-08-20 MED ORDER — HYDROCODONE-ACETAMINOPHEN 5-325 MG PO TABS
ORAL_TABLET | ORAL | Status: AC
  Administered 2017-08-20: 2 via ORAL
  Filled 2017-08-20: qty 2

## 2017-08-20 MED ORDER — MIDODRINE HCL 5 MG PO TABS
ORAL_TABLET | ORAL | Status: AC
  Administered 2017-08-20: 10 mg via ORAL
  Filled 2017-08-20: qty 10

## 2017-08-20 MED ORDER — MORPHINE SULFATE (PF) 4 MG/ML IV SOLN
INTRAVENOUS | Status: AC
  Administered 2017-08-20: 1 mg
  Filled 2017-08-20: qty 1

## 2017-08-20 MED ORDER — WARFARIN SODIUM 5 MG PO TABS
5.0000 mg | ORAL_TABLET | Freq: Once | ORAL | Status: AC
  Administered 2017-08-20: 5 mg via ORAL
  Filled 2017-08-20: qty 1

## 2017-08-20 NOTE — Care Management Important Message (Signed)
Important Message  Patient Details  Name: Michael [name] MRN: [id number] Date of Birth: 14-May-1961   Medicare Important Message Given:  Yes    [name] 08/20/2017, 10:49 AM

## 2017-08-20 NOTE — Progress Notes (Signed)
No charge note:   Rapid response event noted.   Meeting scheduled with daughter present for 1330 tomorrow 10/5.  Mariana Kaufman, AGNP-C Palliative Medicine  Please call Palliative Medicine team phone with any questions [phone]. For individual providers please see AMION.

## 2017-08-20 NOTE — Progress Notes (Signed)
Patient arrived to unit by bed.  Reviewed treatment plan and this RN agrees with plan.  Report received from bedside RN, Bryson Ha.  Consent verified.  Patient A & O X 4.   Lung sounds diminished and clear to ausculation in all fields. BLE 2+ pitting edema. Cardiac:  Afib, rate controlled.  Removed caps and cleansed RIJ catheter with chlorhedxidine.  Aspirated ports of heparin and flushed them with saline per protocol.  Connected and secured lines, initiated treatment at 1042.  UF Goal of 3500 mL and net fluid removal 3 L.  Will continue to monitor.

## 2017-08-20 NOTE — Progress Notes (Signed)
Subjective:  Patient was seen laying comfortably in his bed this morning. He states he feels okay. He endorses some mild chest pain today. He has no other complaints this morning.  Objective:  Vital signs in last 24 hours: Vitals:   08/20/17 0230 08/20/17 0405 08/20/17 0407 08/20/17 0459  BP: (!) 85/61 (!) 80/52 (!) 84/53 (!) 83/49  Pulse:      Resp:    15  Temp:  97.9 F (36.6 C)  98.4 F (36.9 C)  TempSrc:  Axillary  Oral  SpO2:  96%  98%  Weight:    173 lb 3.2 oz (78.6 kg)  Height:       Physical Exam  Constitutional: He is oriented to person, place, and time and well-developed, well-nourished, and in no distress.  HENT:  Head: Normocephalic and atraumatic.  Eyes: EOM are normal. Right eye exhibits no discharge. Left eye exhibits no discharge.  Cardiovascular: Exam reveals no gallop and no friction rub.   No murmur heard. Irregularly irregular rhythm  Pulmonary/Chest: Effort normal and breath sounds normal. No respiratory distress. He has no wheezes.  R basilar crackles  Abdominal: Soft. He exhibits no distension. There is no tenderness.  Musculoskeletal: He exhibits no edema.  Mildly darker skin over left forearm, with increased edema  Neurological: He is alert and oriented to person, place, and time.  Skin: Skin is warm and dry.  R femoral cath site clean and dry    Assessment/Plan:  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  Cardiac arrest/syncope: Patient presented to the ED after he became unresponsive, pulseless and apneic during his hemodialysis session. CPR was performed and patient became responsive and had ROSC. Cardiology is following this admission.  He had a Myoview that showed reversibility in mid  inferior wall. EF greater than 50%. Cardiac cath on 10/1 was delayed to 10/3 due to elevated INR. 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  - Appreciate cardiology recommendations - Heparin per pharmacy will bridge back to warfarin  Left upper extremity cellulitis During admission patient complained of increasing left forearm swelling and pain. There was concern for a clotted fistula. Left upper extremity Doppler was negative. He was started on Vanc and cefepime for possible cellulitis. This has been de-escalated to cefazolin and patient is currently improved antibiotics. Completed 7 day course. 1 day after discontinuation of antibiotic (and after a heart cath and HD that day) patient was found to be tachycardia and bebrile (101.8, axillary) with increased warmth and edema in LUE (10/3). Concern for recurrent Cellulitis vs Thrombophlebitis. Blood cultures pending.  - Cefazolin - LUE Venous duplex   Right Basilar Rales: In the setting of recent fever - Chest Xray   Chronic Atrial fibrillation Patient had an episode of A. Fib with RVR in hemodialysis 9/26 while inpatient. Patient was started on warfarin amiodarone and metoprolol. Patient is currently on metoprolol. - Heparin bridge to warfarin - Metorprolol  ESRD on HD MWFS: Patient has experienced significant difficulties with HD throughout his hospitalization. He continues to have volume overload despite attempts at HD. He also has chronic hypotension which is exacerbated by HD.  In the setting of his severe right heart failure and this failure of HD, palliative care has been consulted by nephrology and have been in discussion with the patient and his family.  - Nephrology following - Midodrine  Right 4th digit pain and swelling Suspect gout flair. Patient states this is similar to previous flairs. Resolved with 5d course of  Prednisone.   DM  On 10 units Levemir qhsat home, currently has run out.  - SSI  Elevated ALP Patient has persistent elevated ALP. Abdominal ultrasound in the past has revealed gallbladder wall thickening. He also has persistent elevation in bilirubin. Based on imaging in the past including CT scan and ultrasound of abdomen there is suggestion of liver cirrhosis.  Patient has currently multiple medical issues and once he has improved would consider repeat abdominal ultrasound for further evaluation.   Dispo: Anticipated discharge pending clinical course.  Neva Seat, MD 08/20/2017, 7:02 AM Pager: [phone]

## 2017-08-20 NOTE — Progress Notes (Signed)
Dialysis treatment completed.  3500 mL ultrafiltrated.  3000 mL net fluid removal.  Patient status unchanged. Lung sounds diminished to ausculation in all fields. BLE 1+ edema. Cardiac: Afib.  Cleansed RIJ catheter with chlorhexidine.  Disconnected lines and flushed ports with saline per protocol.  Ports locked with heparin and capped per protocol.    Report given to bedside, RN Bryson Ha.

## 2017-08-20 NOTE — Progress Notes (Signed)
ANTICOAGULATION CONSULT NOTE  Pharmacy Consult to Heparin / Warfarin Indication: atrial fibrillation   No Known Allergies  Vital Signs: Temp: 98.4 F (36.9 C) (10/04 0459) Temp Source: Oral (10/04 0459) BP: 83/49 (10/04 0459) Pulse Rate: 93 (10/03 2043)  Labs:  Recent Labs  08/18/17 1533  08/19/17 0230 08/19/17 1133 08/20/17 0431  HGB  --   < > 10.3* 10.4* 9.8*  HCT  --   --  31.6* 32.8* 30.6*  PLT  --   --  260 234 225  LABPROT 19.9*  --  18.5*  --  18.5*  INR 1.71  --  1.56  --  1.55  HEPARINUNFRC  --   --  <0.10*  --  0.45  CREATININE  --   --   --  3.38* 2.87*  < > = values in this interval not displayed.  Estimated Creatinine Clearance: 27.4 mL/min (A) (by C-G formula based on SCr of 2.87 mg/dL (H)).  Assessment: 56yom with suspected cardiac arrest at HD center. On coumadin pta for afib/bioprosthetic MVR. S/p cath, now resuming heparin and warfarin  Heparin level therapeutic, CBC stable  Home dose: 2.5mg  daily  Goal of Therapy:  INR 2-3  Heparin level 0.3-0.7  Monitor platelets by anticoagulation protocol: Yes   Plan:  Heparin at 1350 units / hr  Warfarin 5 mg po x 1 at 1800 pm Daily heparin level, CBC, INR  Thank you Anette Guarneri, PharmD [phone] 08/20/17 8:01 AM

## 2017-08-20 NOTE — Progress Notes (Addendum)
[facility] Progress Note   Subjective:  No c/o's.   Objective Vitals:   08/20/17 0405 08/20/17 0407 08/20/17 0459 08/20/17 0807  BP: (!) 80/52 (!) 84/53 (!) 83/49 94/67  Pulse:      Resp:   15   Temp: 97.9 F (36.6 C)  98.4 F (36.9 C) 98 F (36.7 C)  TempSrc: Axillary  Oral Oral  SpO2: 96%  98% 100%  Weight:   78.6 kg (173 lb 3.2 oz)   Height:       Physical Exam General: Alert/oriented. NAD. On nasal oxygen. Heart: RRR; no murmur. Lungs: CTAB Extremities: 2+ pitting edema in thighs Dialysis Access: TDC in R chest (no erythema/tenderness), maturing LUE AVF without erythema, but + edema of the arm.  Additional Objective Labs: Basic Metabolic Panel:  Recent Labs Lab 08/14/17 0715 08/16/17 0442 08/17/17 0442 08/19/17 1133 08/20/17 0431  NA 131* 131* 130* 131* 133*  K 4.3 4.5 4.7 3.9 3.7  CL 97* 94* 97* 97* 95*  CO2 28 26 25 24 28   GLUCOSE 107* 92 104* 127* 132*  BUN 23* 20 27* 22* 17  CREATININE 4.29* 3.61* 3.80* 3.38* 2.87*  CALCIUM 8.6* 8.8* 8.7* 8.4* 8.3*  PHOS 4.4 3.9  --  3.9  --    Liver Function Tests:  Recent Labs Lab 08/14/17 0715 08/16/17 0442 08/19/17 1133  AST  --  26  --   ALT  --  12*  --   ALKPHOS  --  175*  --   BILITOT  --  1.5*  --   PROT  --  6.9  --   ALBUMIN 2.7* 3.0* 2.8*   CBC:  Recent Labs Lab 08/16/17 0442 08/17/17 0442 08/19/17 0230 08/19/17 1133 08/20/17 0431  WBC 5.9 8.9 7.4 6.6 8.3  HGB 11.1* 11.1* 10.3* 10.4* 9.8*  HCT 34.8* 34.6* 31.6* 32.8* 30.6*  MCV 83.5 82.2 82.5 83.5 83.4  PLT 243 268 260 234 225   Blood Culture    Component Value Date/Time   SDES BLOOD RIGHT HAND 08/10/2017 1615   SPECREQUEST Blood Culture adequate volume 08/10/2017 1615   CULT NO GROWTH 5 DAYS 08/10/2017 1615   REPTSTATUS 08/15/2017 FINAL 08/10/2017 1615   CBG:  Recent Labs Lab 08/19/17 1023 08/19/17 1210 08/19/17 1755 08/19/17 2000 08/20/17 0733  GLUCAP 67 105* 73 97 96   Medications: . sodium  chloride 10 mL/hr at 08/19/17 0277  . sodium chloride    .  ceFAZolin (ANCEF) IV Stopped (08/19/17 2322)  . ferric gluconate (FERRLECIT/NULECIT) IV Stopped (08/19/17 1630)  . heparin 1,350 Units/hr (08/19/17 1841)  . sodium chloride     . allopurinol  150 mg Oral Daily  . atorvastatin  20 mg Oral Daily  . calcium acetate  667 mg Oral TID WC  . darbepoetin (ARANESP) injection - DIALYSIS  100 mcg Intravenous Q Wed-HD  . diclofenac sodium  4 g Topical QID  . feeding supplement (PRO-STAT SUGAR FREE 64)  30 mL Oral BID  . insulin aspart  0-5 Units Subcutaneous QHS  . insulin aspart  0-9 Units Subcutaneous TID WC  . metoprolol succinate  25 mg Oral BID  . midodrine  10 mg Oral TID WC  . pantoprazole  40 mg Oral Daily  . sodium chloride flush  3 mL Intravenous Q12H  . warfarin  5 mg Oral ONCE-1800  . Warfarin - Pharmacist Dosing Inpatient   Does not apply q1800    Dialysis Orders: MWFSat at [facility] 4hr, BFR  400, DFR 800, EDW 75kg, 2K/2.25Ca, TDC R / maturing LUA AVF, Hep 4200 - Venofer 50/wk - Mircera 200 q 2 weeks (last given 9/19)  Assessment/Plan: 1. Afib with RVR: On metoprolol BID, resuming warfarin 10/3 evening. 2. CAD (+ nuclear stress test): Now s/p right and left HC 10/3 showing 100% stenosed RPDA lesion, + collaterals to distal vessel, no amenable PCI target. Otherwise minimal CAD. + Pulm HTN with elevated LV pressures. 2. ESRD: Usually MWFS schedule (on 4x/week due to low BPs). Still with significant edema/anasarca, 3L with last HD. FLuids discussed. We have been unable to make significant headway in overall volume reduction due to inability to tolerated large UF due to hypotension and CP. 2L removed 10/1, 1.8L (10/2), 3L (10/3). Will keep trying. HD again today. 3. HypoTN/volume: BP chronically low, on midodrine 10mg  TID. Still edematous, increasing UF as tolerated. 4. Anemia: Hgb 9.8. Aranesp resumed 10/3 (147mcg). 5. Secondary hyperparathyroidism: Labs ok. Continue Phoslo as  binder. 6. Nutrition: Alb low, starting Pro-stat. 7. Cardiomyopathy (EF 50-55% per 9/27 Echo) 8. S/p MVR and TV repair 9. Hx cardiac arrest (9/24 at HD center): Brief CPR before ROSC.  LVEF 50-55%. See above. 10. Fever 103F (10/3): BCx pending. Started empirically on Cefazolin. Will get CXR to evaluate.  Veneta Penton, PA-C 08/20/2017, 9:33 AM  [facility] Pager: [phone]  Pt seen, examined and agree w A/P as above. Spoke with cardiology who knows pt from prolonged admission recently.  Pt has severe , well recognized end-stage R HF, was on milrinone infusions prior to going on HD.  Required prolonged CRRT due to severe hypotensoin and fluid overload.  Pt was then transitioned to regular HD and dc'd home on Sept 10th.  He was readmitted 2 wks later on Sept 24th after a cardiac arrest which occurred at outpatient dialysis that day.  This event is a marker of the severity of his heart disease.  He has been  hospitalized now about 10 days w diffuse vol overload/ anasarca and attempts to get fluid off.  Wt's and exam show no improvement or reduction in volume/ edema , actually wts increased from 76 to 80kg, now down to 76kg again.  Pt's BP's drop very low on HD into the 60's and 70's, despite maximal midodrine.  Patient has end-stage heart failure (RV) and is failing an attempt at regular dialysis which was discussed in prior palliative care meetings.  CRRT is not an option for long-term volume control.  Unfortunately he is not a candidate for heart transplant.  There is nothing that cardiology has to offer, I have discussed with them.  Need palliative care assistance as patient is poor candidate for long-term hemodialysis.  Due to end-stage heart failure the dialysis procedure is unacceptably unsafe for this patient.    We will have to discuss transition to hospice/ comfort care.  This has all been discussed w/ patient and family before per prior notes.  Pt deferred EOL discussions to his  daughter.   I have d/w pall care and they are meeting with daughter tomorrow.   Kelly Splinter MD [employer] pager [phone]   08/20/2017, 1:50 PM

## 2017-08-20 NOTE — Plan of Care (Signed)
Problem: Physical Regulation: Goal: Ability to maintain clinical measurements within normal limits will improve Outcome: Progressing Patient received 250cc bolus along with ABX after blood cultures drawn last night. Tylenol given for comfort/temperature. Rigors improved. Norco not given due to hypotension.   Problem: Tissue Perfusion: Goal: Risk factors for ineffective tissue perfusion will decrease Outcome: Progressing Heparin maintained at 13.5 cc/hr. Planned bridge to coumadin.

## 2017-08-20 NOTE — Progress Notes (Signed)
  Date: 08/20/2017  Patient name: Michael [name]  Medical record number: [id number]  Date of birth: Aug 17, 1961   I have seen and evaluated this patient and I have discussed the plan of care with the house staff. Please see their note for complete details. I concur with their findings with the following additions/corrections:   Seen together on rounds this morning. Unfortunately, he did develop a fever last night accompanied by A. fib with RVR and lower blood pressures. He received Tylenol, had blood cultures sent, and was restarted on cefazolin. Based on my exam last night and this morning, the primary concern was for recurrence of his left arm cellulitis, possibly including thrombophlebitis. We will obtain an ultrasound to look for any abscess or thrombosis in the area. Also on exam this morning, he has diminished breath sounds with crackles at his right lung base, and we will obtain a chest x-ray to further evaluate this. However, I have very low suspicion of pneumonia and him as he has not had any increasing hypoxia, cough, sputum production, or respiratory distress.  Overall, his clinical picture is very concerning. I appreciate nephrology having frank discussions with him about his long-term ability to tolerate dialysis and the anticipated involvement of palliative care. In the end, he is limited by his severe RV dysfunction.  Oda Kilts, MD 08/20/2017, 4:52 PM

## 2017-08-21 ENCOUNTER — Inpatient Hospital Stay (HOSPITAL_COMMUNITY): Payer: Medicare Other

## 2017-08-21 DIAGNOSIS — R609 Edema, unspecified: Secondary | ICD-10-CM

## 2017-08-21 LAB — GLUCOSE, CAPILLARY
GLUCOSE-CAPILLARY: 146 mg/dL — AB (ref 65–99)
Glucose-Capillary: 104 mg/dL — ABNORMAL HIGH (ref 65–99)
Glucose-Capillary: 87 mg/dL (ref 65–99)
Glucose-Capillary: 106 mg/dL — ABNORMAL HIGH (ref 65–99)

## 2017-08-21 LAB — RENAL FUNCTION PANEL
Albumin: 2.7 g/dL — ABNORMAL LOW (ref 3.5–5.0)
Anion gap: 6 (ref 5–15)
BUN: 27 mg/dL — AB (ref 6–20)
CHLORIDE: 98 mmol/L — AB (ref 101–111)
CO2: 28 mmol/L (ref 22–32)
CREATININE: 3.01 mg/dL — AB (ref 0.61–1.24)
Calcium: 8.3 mg/dL — ABNORMAL LOW (ref 8.9–10.3)
GFR calc non Af Amer: 22 mL/min — ABNORMAL LOW (ref >60)
GFR, EST AFRICAN AMERICAN: 25 mL/min — AB (ref >60)
GLUCOSE: 99 mg/dL (ref 65–99)
Phosphorus: 2.9 mg/dL (ref 2.5–4.6)
Potassium: 4.1 mmol/L (ref 3.5–5.1)
Sodium: 132 mmol/L — ABNORMAL LOW (ref 135–145)

## 2017-08-21 LAB — CBC
HCT: 27.3 % — ABNORMAL LOW (ref 39.0–52.0)
HCT: 30.8 % — ABNORMAL LOW (ref 39.0–52.0)
Hemoglobin: 8.8 g/dL — ABNORMAL LOW (ref 13.0–17.0)
Hemoglobin: 9.5 g/dL — ABNORMAL LOW (ref 13.0–17.0)
MCH: 26.8 pg (ref 26.0–34.0)
MCH: 25.8 pg — AB (ref 26.0–34.0)
MCHC: 32.2 g/dL (ref 30.0–36.0)
MCHC: 30.8 g/dL (ref 30.0–36.0)
MCV: 83.2 fL (ref 78.0–100.0)
MCV: 83.7 fL (ref 78.0–100.0)
PLATELETS: 191 10*3/uL (ref 150–400)
Platelets: 177 10*3/uL (ref 150–400)
RBC: 3.28 MIL/uL — ABNORMAL LOW (ref 4.22–5.81)
RBC: 3.68 MIL/uL — ABNORMAL LOW (ref 4.22–5.81)
RDW: 18.5 % — ABNORMAL HIGH (ref 11.5–15.5)
RDW: 18.3 % — AB (ref 11.5–15.5)
WBC: 6.3 10*3/uL (ref 4.0–10.5)
WBC: 6.7 10*3/uL (ref 4.0–10.5)

## 2017-08-21 LAB — PROTIME-INR
INR: 1.46
Prothrombin Time: 17.6 seconds — ABNORMAL HIGH (ref 11.4–15.2)

## 2017-08-21 LAB — HEPARIN LEVEL (UNFRACTIONATED)
Heparin Unfractionated: 0.15 IU/mL — ABNORMAL LOW (ref 0.30–0.70)
Heparin Unfractionated: 0.82 IU/mL — ABNORMAL HIGH (ref 0.30–0.70)

## 2017-08-21 MED ORDER — HEPARIN BOLUS VIA INFUSION
1000.0000 [IU] | Freq: Once | INTRAVENOUS | Status: AC
  Administered 2017-08-21: 1000 [IU] via INTRAVENOUS
  Filled 2017-08-21: qty 1000

## 2017-08-21 MED ORDER — ALTEPLASE 2 MG IJ SOLR: 2.0000 mg | Freq: Once | INTRAMUSCULAR | Status: DC | PRN

## 2017-08-21 MED ORDER — PENTAFLUOROPROP-TETRAFLUOROETH EX AERO: 1.0000 "application " | INHALATION_SPRAY | CUTANEOUS | Status: DC | PRN

## 2017-08-21 MED ORDER — HEPARIN SODIUM (PORCINE) 1000 UNIT/ML DIALYSIS: 1000.0000 [IU] | INTRAMUSCULAR | Status: DC | PRN

## 2017-08-21 MED ORDER — WARFARIN SODIUM 7.5 MG PO TABS
7.5000 mg | ORAL_TABLET | Freq: Once | ORAL | Status: AC
  Administered 2017-08-21: 7.5 mg via ORAL
  Filled 2017-08-21: qty 1

## 2017-08-21 MED ORDER — SODIUM CHLORIDE 0.9 % IV SOLN: 100.0000 mL | INTRAVENOUS | Status: DC | PRN

## 2017-08-21 MED ORDER — LIDOCAINE-PRILOCAINE 2.5-2.5 % EX CREA: 1.0000 "application " | TOPICAL_CREAM | CUTANEOUS | Status: DC | PRN

## 2017-08-21 MED ORDER — LIDOCAINE HCL (PF) 1 % IJ SOLN: 5.0000 mL | INTRAMUSCULAR | Status: DC | PRN

## 2017-08-21 MED FILL — Lidocaine HCl Local Inj 2%: INTRAMUSCULAR | Qty: 10 | Status: AC

## 2017-08-21 NOTE — Progress Notes (Signed)
*  PRELIMINARY RESULTS* Vascular Ultrasound Left upper extremity venous duplex has been completed.  Preliminary findings: No evidence of DVT or superficial thrombosis. Possible fluid collection with mixed internal echoes noted at AVF area.     Landry Mellow, RDMS, RVT  08/21/2017, 11:04 AM

## 2017-08-21 NOTE — Progress Notes (Signed)
ANTICOAGULATION CONSULT NOTE  Pharmacy Consult to Heparin / Warfarin Indication: atrial fibrillation   No Known Allergies  Vital Signs: Temp: 98.2 F (36.8 C) (10/05 1400) Temp Source: Oral (10/05 1400) BP: 82/65 (10/05 1500) Pulse Rate: 115 (10/05 1500)  Labs:  Recent Labs  08/19/17 0230 08/19/17 1133 08/20/17 0431 08/21/17 0347 08/21/17 1441 08/21/17 1442  HGB 10.3* 10.4* 9.8* 8.8*  --  9.5*  HCT 31.6* 32.8* 30.6* 27.3*  --  30.8*  PLT 260 234 225 177  --  191  LABPROT 18.5*  --  18.5* 17.6*  --   --   INR 1.56  --  1.55 1.46  --   --   HEPARINUNFRC <0.10*  --  0.45 0.15* 0.82*  --   CREATININE  --  3.38* 2.87*  --   --  3.01*    Estimated Creatinine Clearance: 26.2 mL/min (A) (by C-G formula based on SCr of 3.01 mg/dL (H)).  Assessment: 56yom with suspected cardiac arrest at HD center. On coumadin pta for afib/bioprosthetic MVR. S/p cath, now resuming heparin and warfarin  Heparin now hight at 0.82 INR low at 1.46, slow to trend s/p vitamin K 5 mg po  Home dose: 2.5mg  daily  Goal of Therapy:  INR 2-3  Heparin level 0.3-0.7  Monitor platelets by anticoagulation protocol: Yes   Plan:  Decrease heparin to 1400 units / hr Follow up AM labs  Thank you Anette Guarneri, PharmD [phone] 08/21/2017 3:32 PM

## 2017-08-21 NOTE — Progress Notes (Signed)
ANTICOAGULATION CONSULT NOTE  Pharmacy Consult to Heparin / Warfarin Indication: atrial fibrillation   No Known Allergies  Vital Signs: Temp: 98.1 F (36.7 C) (10/05 0421) Temp Source: Oral (10/05 0421) BP: 94/65 (10/05 0421) Pulse Rate: 79 (10/05 0421)  Labs:  Recent Labs  08/19/17 0230 08/19/17 1133 08/20/17 0431 08/21/17 0347  HGB 10.3* 10.4* 9.8* 8.8*  HCT 31.6* 32.8* 30.6* 27.3*  PLT 260 234 225 177  LABPROT 18.5*  --  18.5* 17.6*  INR 1.56  --  1.55 1.46  HEPARINUNFRC <0.10*  --  0.45 0.15*  CREATININE  --  3.38* 2.87*  --     Estimated Creatinine Clearance: 26.9 mL/min (A) (by C-G formula based on SCr of 2.87 mg/dL (H)).  Assessment: 56yom with suspected cardiac arrest at HD center. On coumadin pta for afib/bioprosthetic MVR. S/p cath, now resuming heparin and warfarin  Heparin level low at 0.15  Home dose: 2.5mg  daily  Goal of Therapy:  INR 2-3  Heparin level 0.3-0.7  Monitor platelets by anticoagulation protocol: Yes   Plan:  Heparin bolus 1000 units x 1 Increase gtt to 1500 units/hr Next lvl 1200 Day PharmD to address warfarin  Levester Fresh, PharmD, BCPS, BCCCP Clinical Pharmacist Clinical phone for 08/21/2017 from 7a-3:30p: [phone] If after 3:30p, please call main pharmacy at: [phone] 08/21/2017 5:56 AM

## 2017-08-21 NOTE — Progress Notes (Signed)
ANTICOAGULATION CONSULT NOTE  Pharmacy Consult to Heparin / Warfarin Indication: atrial fibrillation   No Known Allergies  Vital Signs: Temp: 98 F (36.7 C) (10/05 0825) Temp Source: Oral (10/05 0825) BP: 92/55 (10/05 0825) Pulse Rate: 79 (10/05 0421)  Labs:  Recent Labs  08/19/17 0230 08/19/17 1133 08/20/17 0431 08/21/17 0347  HGB 10.3* 10.4* 9.8* 8.8*  HCT 31.6* 32.8* 30.6* 27.3*  PLT 260 234 225 177  LABPROT 18.5*  --  18.5* 17.6*  INR 1.56  --  1.55 1.46  HEPARINUNFRC <0.10*  --  0.45 0.15*  CREATININE  --  3.38* 2.87*  --     Estimated Creatinine Clearance: 26.9 mL/min (A) (by C-G formula based on SCr of 2.87 mg/dL (H)).  Assessment: 56yom with suspected cardiac arrest at HD center. On coumadin pta for afib/bioprosthetic MVR. S/p cath, now resuming heparin and warfarin  Heparin level low at 0.15 INR low at 1.46, slow to trend s/p vitamin K 5 mg po  Home dose: 2.5mg  daily  Goal of Therapy:  INR 2-3  Heparin level 0.3-0.7  Monitor platelets by anticoagulation protocol: Yes   Plan:  Increase gtt to 1500 units/hr Next lvl 1200 Warfarin 7.5 mg po x 1 today  Thank you Anette Guarneri, PharmD [phone] 08/21/2017 8:27 AM

## 2017-08-21 NOTE — Care Management (Addendum)
Case Management Note Initial note started by Jacqlyn Krauss, RN CM on 08/14/17.   Patient Details  Name: Michael [name] MRN: [id number] Date of Birth: February 02, 1961  Subjective/Objective:  Pt presented for syncope and collapse @ HD-CPR performed.  Hx of DM & ESRD- MWF Schedule. Treating for Code Sepsis- Cellulits. On IV Ancef. Plan for 2 day Myoview.  Pt is from home alone. Pt states he has family support that checks in on him from time to time. Pt has DME 4 pronged cane. He uses the [employer] for transportation. Pt will benefit from PT/OT consult.                    Action/Plan: CM will continue to monitor for disposition needs.   Expected Discharge Date:                         Expected Discharge Plan:  [place]  In-House Referral:     Discharge planning Services  CM Consult  Post Acute Care Choice: Home Health, Resumption Orders Choice offered to:   Patient  DME Arranged:    DME Agency:     HH Arranged: RN, PT     [place] Agency:   Alvis Lemmings  Status of Service:  In process, will continue to follow  If discussed at Long Length of Stay Meetings, dates discussed:    Additional Comments: Claudie Leach RN CM [phone] Confirmed with [facility] services above.  Pt will benefit from PT/OT consult.  Palliative care to have family meeting today at 1:30.  CM will continue to monitor.

## 2017-08-21 NOTE — Progress Notes (Signed)
  Date: 08/21/2017  Patient name: Michael [name]  Medical record number: [id number]  Date of birth: 01/21/1961   I have seen and evaluated this patient and I have discussed the plan of care with the house staff. Please see their note for complete details. I concur with their findings with the following additions/corrections:   No major change in status today. No further fevers since his episode the other day. CXR yesterday did not show any sign of pneumonia, but did show worsening pleural effusion on the right, likely related to volume overload. Evaluate in his left arm cellulitis and fistula area for any thrombophlebitis or soft tissue abscess. Anticipating goals of care discussion with nephrology and his daughter this evening, which will inform our course of action going forward.  Oda Kilts, MD 08/21/2017, 2:51 PM

## 2017-08-21 NOTE — Consult Note (Signed)
   [facility] CM Inpatient Consult   08/21/2017  Michael [name] 1961/10/15 [id number]    [facility] Care Management follow up.  Spoke with inpatient RNCM to discuss that [facility] Management has been following. Noted hospitalization has been greater than 10 days so likely will re-refer to [facility] team if need be.  Discussed that patient is also active with [place] home health.  Noted palliative medicine meeting scheduled for today 10/5 at 1330.   Will continue to follow.    [name], MSN-Ed, RN,BSN [facility] Liaison [phone]

## 2017-08-21 NOTE — Progress Notes (Signed)
[facility] Progress Note   Subjective:  No c/o's.   Objective Vitals:   08/21/17 0421 08/21/17 0825 08/21/17 0926 08/21/17 1145  BP: 94/65 (!) 92/55 105/79 105/66  Pulse: 79  71   Resp: 15  14   Temp: 98.1 F (36.7 C) 98 F (36.7 C)    TempSrc: Oral Oral    SpO2: 99% 94% 98% 98%  Weight: 74.9 kg (165 lb 2 oz)     Height:       Physical Exam General: Alert/oriented. NAD. On nasal oxygen. Heart: RRR; no murmur. Lungs: CTAB Extremities: 2+ pitting edema in thighs Dialysis Access: TDC in R chest (no erythema/tenderness), maturing LUE AVF without erythema, but + edema of the arm.  Additional Objective Labs: Basic Metabolic Panel:  Recent Labs Lab 08/16/17 0442 08/17/17 0442 08/19/17 1133 08/20/17 0431  NA 131* 130* 131* 133*  K 4.5 4.7 3.9 3.7  CL 94* 97* 97* 95*  CO2 26 25 24 28   GLUCOSE 92 104* 127* 132*  BUN 20 27* 22* 17  CREATININE 3.61* 3.80* 3.38* 2.87*  CALCIUM 8.8* 8.7* 8.4* 8.3*  PHOS 3.9  --  3.9  --    Liver Function Tests:  Recent Labs Lab 08/16/17 0442 08/19/17 1133  AST 26  --   ALT 12*  --   ALKPHOS 175*  --   BILITOT 1.5*  --   PROT 6.9  --   ALBUMIN 3.0* 2.8*   CBC:  Recent Labs Lab 08/17/17 0442 08/19/17 0230 08/19/17 1133 08/20/17 0431 08/21/17 0347  WBC 8.9 7.4 6.6 8.3 6.3  HGB 11.1* 10.3* 10.4* 9.8* 8.8*  HCT 34.6* 31.6* 32.8* 30.6* 27.3*  MCV 82.2 82.5 83.5 83.4 83.2  PLT 268 260 234 225 177   Blood Culture    Component Value Date/Time   SDES BLOOD RIGHT HAND 08/19/2017 2050   SPECREQUEST  08/19/2017 2050    BOTTLES DRAWN AEROBIC ONLY Blood Culture adequate volume   CULT NO GROWTH < 24 HOURS 08/19/2017 2050   REPTSTATUS PENDING 08/19/2017 2050   CBG:  Recent Labs Lab 08/20/17 0733 08/20/17 1611 08/20/17 2112 08/21/17 0730 08/21/17 1131  GLUCAP 96 126* 98 104* 87   Medications: . sodium chloride 10 mL/hr at 08/19/17 0613  . sodium chloride    .  ceFAZolin (ANCEF) IV Stopped (08/20/17  2343)  . ferric gluconate (FERRLECIT/NULECIT) IV Stopped (08/19/17 1630)  . heparin 1,500 Units/hr (08/21/17 0610)  . sodium chloride     . allopurinol  150 mg Oral Daily  . atorvastatin  20 mg Oral Daily  . calcium acetate  667 mg Oral TID WC  . darbepoetin (ARANESP) injection - DIALYSIS  100 mcg Intravenous Q Wed-HD  . diclofenac sodium  4 g Topical QID  . feeding supplement (PRO-STAT SUGAR FREE 64)  30 mL Oral BID  . insulin aspart  0-5 Units Subcutaneous QHS  . insulin aspart  0-9 Units Subcutaneous TID WC  . metoprolol succinate  25 mg Oral BID  . midodrine  10 mg Oral TID WC  . pantoprazole  40 mg Oral Daily  . sodium chloride flush  3 mL Intravenous Q12H  . warfarin  7.5 mg Oral ONCE-1800  . Warfarin - Pharmacist Dosing Inpatient   Does not apply q1800    Dialysis Orders: MW[facility]at at [facility] 4hr, BFR 400, DFR 800, EDW 75kg, 2K/2.25Ca, TDC R / maturing LUA AVF, Hep 4200 - Venofer 50/wk - Mircera 200 q 2 weeks (last given 9/19)  Assessment/Plan: 1. Afib with RVR: On metoprolol BID, resuming warfarin 10/3 evening. 2. CAD (+ nuclear stress test): Now s/p right and left HC 10/3 showing 100% stenosed RPDA lesion, + collaterals to distal vessel, no amenable PCI target. Otherwise minimal CAD. + Pulm HTN with elevated LV pressures. 2. ESRD: Usually MWFS schedule (on 4x/week due to low BPs). Still with significant edema/anasarca, 3L with last HD. FLuids discussed. We have been unable to make significant headway in overall volume reduction due hypotension on HD 3. HypoTN/volume: BP chronically low, on midodrine 10mg  TID. Still edematous, increasing UF 4. Anemia: Hgb 9.8. Aranesp resumed 10/3 (157mcg). 5. Secondary hyperparathyroidism: Labs ok. Continue Phoslo as binder. 6. Nutrition: Alb low, starting Pro-stat. 7. Cardiomyopathy (EF 50-55% per 9/27 Echo) 8. S/p MVR and TV repair 9. Hx cardiac arrest (9/24 at HD center): Brief CPR before ROSC.  LVEF 50-55%. See above. 10. Fever 103F  (10/3): BCx pending. Started empirically on Cefazolin. Will get CXR to evaluate.   Plan - HD today.  Palliative meeting today w/ daughter.     [facility] pager [phone]   08/21/2017, 11:58 AM

## 2017-08-21 NOTE — Progress Notes (Signed)
Subjective:  Patient was seen sitting in his bed this morning while undergoing LUE venous doppler. He states he feels okay today. He endorses some chest pain today that has been coming and going. He has no other complaints this morning. He spoke with nephrology yesterday about how it has been difficult to dialyze him in the setting of his severe right heart failure and chronic hypotension. He deferred discussions on goals of care to his daughter who is coming by this evening, after work, to speak with palliative care.  Objective:  Vital signs in last 24 hours: Vitals:   08/20/17 2148 08/21/17 0100 08/21/17 0421 08/21/17 0825  BP: 94/62 (!) 87/61 94/65 (!) 92/55  Pulse:  99 79   Resp:  16 15   Temp:  97.9 F (36.6 C) 98.1 F (36.7 C) 98 F (36.7 C)  TempSrc:   Oral Oral  SpO2:  98% 99% 94%  Weight:   165 lb 2 oz (74.9 kg)   Height:       Physical Exam  Constitutional: He is oriented to person, place, and time and well-developed, well-nourished, and in no distress.  HENT:  Head: Normocephalic and atraumatic.  Eyes: EOM are normal. Right eye exhibits no discharge. Left eye exhibits no discharge.  Cardiovascular: Exam reveals no gallop and no friction rub.   No murmur heard. Irregularly irregular rhythm  Pulmonary/Chest: Effort normal and breath sounds normal. No respiratory distress. He has no wheezes.  R basilar crackles Reduced breath sounds at right base  Abdominal: Soft. He exhibits no distension. There is no tenderness.  Musculoskeletal: He exhibits no edema.  Hyper pigmented skin over left forearm, with improving edema  Neurological: He is alert and oriented to person, place, and time.  Skin: Skin is warm and dry.  R femoral cath site clean and dry    Assessment/Plan:  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 Fib, HFpEF, GERD, S/P Mitral Valve replacement and Tricuspid valve repair, recently admitted from 8/9 - 9/10 for Acute  on Chronic HF and AKI who presented following an episode of syncope and collapse at dialysis.  Cardiac arrest/syncope: Patient presented to the ED after he became unresponsive, pulseless and apneic during his hemodialysis session. CPR was performed and patient became responsive and had ROSC. Cardiology is following this admission.  He had a Myoview that showed reversibility in mid inferior wall. EF greater than 50%. Cardiac cath on 10/1 was delayed to 10/3 due to elevated INR. 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  - Heparin bridge back to warfarin  Left upper extremity cellulitis During admission patient complained of increasing left forearm swelling and pain. There was concern for a clotted fistula. Left upper extremity Doppler was negative. He was started on Vanc and cefepime for possible cellulitis. This has been de-escalated to cefazolin and patient is currently improved antibiotics. Completed 7 day course. 1 day after discontinuation of antibiotic (and after a heart cath and HD that day) patient was found to be tachycardia and bebrile (101.8, axillary) with increased warmth and edema in LUE (10/3). Concern for recurrent Cellulitis vs Thrombophlebitis. Blood cultures pending. LUE Venous duplex was negative for Preliminary findings: No evidence of DVT or superficial thrombosis. Possible fluid collection with mixed internal echoes noted at AVF area.  - Cefazolin  Chronic Atrial fibrillation Patient had an  episode of A. Fib with RVR in hemodialysis 9/26 while inpatient. Patient was started on warfarin amiodarone and metoprolol. Patient is currently on metoprolol. - Heparin bridge to warfarin - Metorprolol  ESRD on HD MWFS: Patient has experienced significant difficulties with HD throughout his hospitalization. He continues to have volume overload despite attempts at HD. He also has  chronic hypotension which is exacerbated by HD. In the setting of his severe right heart failure and this failure of HD, palliative care has been consulted by nephrology and have been in discussion with the patient and his family. Increased pleural effusion on CXR (10/4) - Nephrology following - Midodrine  Right 4th digit pain and swelling Suspect gout flair. Patient states this is similar to previous flairs. Resolved with 5d course of Prednisone.   DM  On 10 units Levemir qhsat home, currently has run out.  - SSI  Elevated ALP Patient has persistent elevated ALP. Abdominal ultrasound in the past has revealed gallbladder wall thickening. He also has persistent elevation in bilirubin. Based on imaging in the past including CT scan and ultrasound of abdomen there is suggestion of liver cirrhosis.  Patient has currently multiple medical issues and once he has improved would consider repeat abdominal ultrasound for further evaluation.   Dispo: Anticipated discharge pending clinical course.  Neva Seat, MD 08/21/2017, 9:22 AM Pager: [phone]

## 2017-08-21 NOTE — Progress Notes (Signed)
SBP <100. Per MD hold evening dose of Toprol.

## 2017-08-22 LAB — CBC
HCT: 31.5 % — ABNORMAL LOW (ref 39.0–52.0)
Hemoglobin: 10 g/dL — ABNORMAL LOW (ref 13.0–17.0)
MCH: 26.4 pg (ref 26.0–34.0)
MCHC: 31.7 g/dL (ref 30.0–36.0)
MCV: 83.1 fL (ref 78.0–100.0)
Platelets: 153 10*3/uL (ref 150–400)
RBC: 3.79 MIL/uL — ABNORMAL LOW (ref 4.22–5.81)
RDW: 18.2 % — ABNORMAL HIGH (ref 11.5–15.5)
WBC: 6.1 10*3/uL (ref 4.0–10.5)

## 2017-08-22 LAB — HEPARIN LEVEL (UNFRACTIONATED)
Heparin Unfractionated: 0.15 IU/mL — ABNORMAL LOW (ref 0.30–0.70)
Heparin Unfractionated: 0.25 IU/mL — ABNORMAL LOW (ref 0.30–0.70)

## 2017-08-22 LAB — PROTIME-INR
INR: 1.79
Prothrombin Time: 20.7 seconds — ABNORMAL HIGH (ref 11.4–15.2)

## 2017-08-22 LAB — GLUCOSE, CAPILLARY
GLUCOSE-CAPILLARY: 79 mg/dL (ref 65–99)
GLUCOSE-CAPILLARY: 112 mg/dL — AB (ref 65–99)
GLUCOSE-CAPILLARY: 72 mg/dL (ref 65–99)
GLUCOSE-CAPILLARY: 101 mg/dL — AB (ref 65–99)

## 2017-08-22 MED ORDER — HEPARIN BOLUS VIA INFUSION
1000.0000 [IU] | Freq: Once | INTRAVENOUS | Status: AC
  Administered 2017-08-22: 1000 [IU] via INTRAVENOUS
  Filled 2017-08-22: qty 1000

## 2017-08-22 MED ORDER — WARFARIN SODIUM 5 MG PO TABS
5.0000 mg | ORAL_TABLET | Freq: Once | ORAL | Status: AC
  Administered 2017-08-22: 5 mg via ORAL
  Filled 2017-08-22: qty 1

## 2017-08-22 MED ORDER — OXYCODONE-ACETAMINOPHEN 5-325 MG PO TABS
ORAL_TABLET | ORAL | Status: AC
  Filled 2017-08-22: qty 1

## 2017-08-22 MED ORDER — MIDODRINE HCL 5 MG PO TABS
ORAL_TABLET | ORAL | Status: AC
  Administered 2017-08-22: 10 mg via ORAL
  Filled 2017-08-22: qty 2

## 2017-08-22 NOTE — Progress Notes (Signed)
ANTICOAGULATION CONSULT NOTE  Pharmacy Consult to Heparin / Warfarin Indication: atrial fibrillation   No Known Allergies  Vital Signs: Temp: 98 F (36.7 C) (10/06 0453) Temp Source: Oral (10/06 0453) BP: 100/66 (10/06 0453) Pulse Rate: 85 (10/06 0453)  Labs:  Recent Labs  08/19/17 1133  08/20/17 0431 08/21/17 0347 08/21/17 1441 08/21/17 1442 08/22/17 0500  HGB 10.4*  --  9.8* 8.8*  --  9.5* 10.0*  HCT 32.8*  --  30.6* 27.3*  --  30.8* 31.5*  PLT 234  --  225 177  --  191 153  LABPROT  --   --  18.5* 17.6*  --   --  20.7*  INR  --   --  1.55 1.46  --   --  1.79  HEPARINUNFRC  --   < > 0.45 0.15* 0.82*  --  0.15*  CREATININE 3.38*  --  2.87*  --   --  3.01*  --   < > = values in this interval not displayed.  Estimated Creatinine Clearance: 25.9 mL/min (A) (by C-G formula based on SCr of 3.01 mg/dL (H)).  Assessment: 56yom with suspected cardiac arrest at HD center. On coumadin pta for afib/bioprosthetic MVR. S/p cath, now resuming heparin and warfarin  Heparin now low at 0.15 on 1 Hep at 1400 units/hr  Home dose: 2.5mg  daily  Goal of Therapy:  INR 2-3  Heparin level 0.3-0.7  Monitor platelets by anticoagulation protocol: Yes   Plan:  Heparin bolus 1000 units x 1 Increase gtt to 1550 units/hr Next lvl 1300 Daily shift to address warfarin  Levester Fresh, PharmD, BCPS, BCCCP Clinical Pharmacist Clinical phone for 08/22/2017 from 7a-3:30p: [phone] If after 3:30p, please call main pharmacy at: [phone] 08/22/2017 5:59 AM

## 2017-08-22 NOTE — Progress Notes (Signed)
PT Cancellation Note  Patient Details Name: Michael [name] MRN: [id number] DOB: [birth year]   Cancelled Treatment:    Reason Eval/Treat Not Completed: Patient at procedure or test/unavailable Patient in hemodialysis. Will check back as time allows.  [name] 08/22/2017, 2:21 PM  [phone]

## 2017-08-22 NOTE — Plan of Care (Signed)
Problem: Pain Managment: Goal: General experience of comfort will improve Outcome: Not Progressing Pt still complaining of intermittent back and joint pain. Medication and cream utilized. Pt repositioned for comfort. Some relief achieved. Will continue to monitor.  Problem: Physical Regulation: Goal: Ability to maintain clinical measurements within normal limits will improve Outcome: Progressing VSS throughout shift. Pt denies any SOB. Will continue to monitor.

## 2017-08-22 NOTE — Plan of Care (Signed)
Problem: Cardiac: Goal: Ability to achieve and maintain adequate cardiopulmonary perfusion will improve Outcome: Progressing VSS. Tolerates Hemodialysis with 2.5 liters removed today.

## 2017-08-22 NOTE — Progress Notes (Signed)
OT Cancellation Note  Patient Details Name: [name] MRN: [id number] DOB: [birth year]   Cancelled Treatment:    Reason Eval/Treat Not Completed: Patient at procedure or test/ unavailable. Pt off unit for HD at this time. Will check back as able.   Norman Herrlich, MS OTR/L  Pager: [phone]   Norman Herrlich 08/22/2017, 2:33 PM

## 2017-08-22 NOTE — Progress Notes (Signed)
Subjective:  Patient was seen laying comfortably in his bed this morning. He states he feels okay today. He endorses some return of chronic knee pain overnight, but no pain this morning. He has no other complaints this morning. He states that his daughter was unable to meet with the palliative care team last night, but she is to return on Sunday (10/7) to meet with them. He states that he want her to meet with them because he feels that he does not have a very good understanding of his condition and does not know what the right questions to ask are. He was given an update of his workup in the hospital including that dialysis was becoming ineffective for him. He was informed that when dialysis fails, it becomes time to discuss end of life care as the body cannot support itself without it (in the setting of failing kidneys). He deferred further discussion to his daughter and had no questions.  Objective:  Vital signs in last 24 hours: Vitals:   08/21/17 1835 08/21/17 1925 08/22/17 0005 08/22/17 0453  BP:  110/76 99/63 100/66  Pulse:  90 77 85  Resp:   17 15  Temp: 98.2 F (36.8 C)  98.9 F (37.2 C) 98 F (36.7 C)  TempSrc: Oral  Oral Oral  SpO2:   96% 97%  Weight:      Height:       Physical Exam  Constitutional: He is oriented to person, place, and time and well-developed, well-nourished, and in no distress.  HENT:  Head: Normocephalic and atraumatic.  Eyes: EOM are normal. Right eye exhibits no discharge. Left eye exhibits no discharge.  Cardiovascular: Exam reveals no gallop and no friction rub.   No murmur heard. Irregularly irregular rhythm  Pulmonary/Chest: Effort normal and breath sounds normal. No respiratory distress. He has no wheezes.  Reduced breath sounds at right base  Abdominal: Soft. He exhibits no distension. There is no tenderness.  Musculoskeletal: He exhibits no edema.  Hyperpigmented skin over left forearm  Neurological: He is alert and oriented to person,  place, and time.  Skin: Skin is warm and dry.    Assessment/Plan:  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  Cardiac arrest/syncope: Patient presented to the ED after he became unresponsive, pulseless and apneic during his hemodialysis session. CPR was performed and patient became responsive and had ROSC. Cardiology is following this admission.  He had a Myoview that showed reversibility in mid inferior wall. EF greater than 50%. Cardiac cath on 10/1 was delayed to 10/3 due to elevated INR. 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  - Heparin bridge back to warfarin  Left upper extremity cellulitis During admission patient complained of increasing left forearm swelling and pain. There was concern for a clotted fistula. Left upper extremity Doppler was negative. He was started on Vanc and cefepime for suspected cellulitis vs thrombophlebitis. This was de-escalated to cefazolin and patient improved. He completed a 7 day course. 1 day after discontinuation of antibiotic (and after a heart cath and HD that day) patient was found to be tachycardia and bebrile (101.8, axillary) with increased  warmth and edema in LUE (10/3). Concern for recurrent Cellulitis vs Thrombophlebitis. Blood cultures pending. LUE Venous duplex was negative for Preliminary findings: No evidence of DVT or superficial thrombosis. Possible fluid collection with mixed internal echoes noted at AVF area. (Awaiting final read). Will consider vascular surgery consult if fluid collection present. - Cefazolin  Chronic Atrial fibrillation Patient had an episode of A. Fib with RVR  in hemodialysis 9/26 while inpatient. Patient was started on warfarin amiodarone and metoprolol. Patient is currently on metoprolol. His evening dose has been held ion multiple occasions due to concern over hypotension. - Heparin bridge to warfarin - Metorprolol  ESRD on HD MWFS: Patient has experienced significant difficulties with HD throughout his hospitalization. He continues to have volume overload despite attempts at HD. He also has chronic hypotension which is exacerbated by HD. In the setting of his severe right heart failure and this failure of HD, palliative care has been consulted by nephrology and are working to discuss goals of care with the patient and his family. Increased asymptomatic pleural effusion on CXR (10/4). - Nephrology following - Midodrine  Right 4th digit pain and swelling Suspect gout flair. Patient states this is similar to previous flairs. Resolved with 5d course of Prednisone.   DM  On 10 units Levemir qhsat home, currently has run out.  - SSI  Elevated ALP Patient has persistent elevated ALP. Abdominal ultrasound in the past has revealed gallbladder wall thickening. He also has persistent elevation in bilirubin. Based on imaging in the past including CT scan and ultrasound of abdomen there is suggestion of liver cirrhosis.  Patient has currently multiple medical issues and once he has improved would consider repeat abdominal ultrasound for further evaluation.   Dispo: Anticipated discharge pending clinical course.  Neva Seat, MD 08/22/2017, 6:42 AM Pager: [phone]

## 2017-08-22 NOTE — Progress Notes (Signed)
Medicine attending: Clinical status and current database reviewed with resident physician Dr. Neva Seat and I concur with his evaluation and management plan which we discussed together. 56 year old Asian man with end-stage renal disease on dialysis admitted on September 24 after he had a cardiac arrest during a dialysis session.  He was successfully resuscitated.  He has developed refractory right sided heart failure.  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  He has severe tricuspid regurgitation and pulmonary hypertension.  He had a bio prosthetic mitral valve replacement in 2012 and a tricuspid valve repair in the past.  Previous coronary bypass surgery in 2011?Marland Kitchen He developed cellulitis on the same arm as his vascular graft.  Initial improvement on antibiotics but when they were discontinued on October 1, he developed recurrent fever of 103 degrees and October 3 antibiotics were resumed.  Vascular surgery to consult. His prognosis is guarded.  Family meeting is planned for tomorrow to discuss goals of care.

## 2017-08-22 NOTE — Progress Notes (Signed)
ANTICOAGULATION CONSULT NOTE  Pharmacy Consult to Heparin / Warfarin Indication: atrial fibrillation   No Known Allergies  Vital Signs: Temp: 98.1 F (36.7 C) (10/06 1137) Temp Source: Oral (10/06 1137) BP: 111/66 (10/06 1500) Pulse Rate: 98 (10/06 1500)  Labs:  Recent Labs  08/20/17 0431 08/21/17 0347 08/21/17 1441 08/21/17 1442 08/22/17 0500 08/22/17 1300  HGB 9.8* 8.8*  --  9.5* 10.0*  --   HCT 30.6* 27.3*  --  30.8* 31.5*  --   PLT 225 177  --  191 153  --   LABPROT 18.5* 17.6*  --   --  20.7*  --   INR 1.55 1.46  --   --  1.79  --   HEPARINUNFRC 0.45 0.15* 0.82*  --  0.15* 0.25*  CREATININE 2.87*  --   --  3.01*  --   --     Estimated Creatinine Clearance: 25.7 mL/min (A) (by C-G formula based on SCr of 3.01 mg/dL (H)).  Assessment: 56yom with suspected cardiac arrest at HD center. On coumadin pta for afib/bioprosthetic MVR. S/p cath, now resuming heparin and warfarin  Heparin level subtherapeutic at 0.25 but increasing INR remains subtherapeutic at 1.79 today s/p vit K  Home warfarin dose: 2.5mg  daily  Goal of Therapy:  INR 2-3  Heparin level 0.3-0.7  Monitor platelets by anticoagulation protocol: Yes   Plan:  Increase heparin gtt to 1650 units/hr Warfarin 5 mg x 1 8 hour heparin level Daily INR, CBC, heparin level, s/s bleeding  Bertis Ruddy, PharmD Pharmacy Resident Pager #: [phone] 08/22/2017 3:34 PM

## 2017-08-22 NOTE — Progress Notes (Signed)
[facility] Progress Note   Subjective:  No c/o's.   Objective Vitals:   08/22/17 0453 08/22/17 0729 08/22/17 0824 08/22/17 0901  BP: 100/66 (!) 88/64 98/75 114/76  Pulse: 85 83 98 93  Resp: 15 11 14 15   Temp: 98 F (36.7 C) 98 F (36.7 C)    TempSrc: Oral Axillary    SpO2: 97% 92% 95% 94%  Weight:      Height:       Physical Exam General: Alert/oriented. NAD. On nasal oxygen. Heart: RRR; no murmur. Lungs: CTAB Extremities: 2+ pitting edema in thighs , abd wall, lower chest wall Dialysis Access: TDC in R chest (no erythema/tenderness), maturing LUE AVF without erythema, but + edema of the arm.    Dialysis Orders: MWFSat at [facility] 4hr, BFR 400, DFR 800, EDW 75kg, 2K/2.25Ca, TDC R / maturing LUA AVF, Hep 4200 - Venofer 50/wk - Mircera 200 q 2 weeks (last given 9/19)  Assessment: 1. Afib with RVR: On metoprolol BID, resuming warfarin 10/3 evening. 2. CAD (+ nuclear stress test): Now s/p right and left HC 10/3 showing 100% stenosed RPDA lesion, + collaterals to distal vessel, no amenable PCI target. Otherwise minimal CAD. + Pulm HTN with elevated LV pressures. 2. ESRD: Usually MWFS schedule (on 4x/week due to low BPs). Still with significant edema. We have been unable to make significant headway in overall volume reduction due hypotension on HD.  Started dialysis last month during prolonged admission/ CRRT.  Pt has advanced severe R HF and so far has not done well with this trial of hemodialysis (coded at OP HD unit on the machine 2 wks after dc home). Prognosis is very poor.  Have consulted palliative care.  3. HypoTN/volume: BP chronically low, on midodrine 10mg  TID 4. Anemia: Hgb 9.8. Aranesp resumed 10/3 (182mcg). 5. Secondary hyperparathyroidism: Labs ok. Continue Phoslo as binder. 6. Nutrition: Alb low, starting Pro-stat. 7. Severe R HF/ recovered LVEF 50-55% 8. S/p MVR and TV repair 9. Hx cardiac arrest (9/24 at HD center): Brief CPR before ROSC.  LVEF  50-55%. See above. 10. Fever 103F (10/3): BCx pending. Started empirically on Cefazolin. Will get CXR to evaluate.   Plan - not sure about dialysis today, pt not feeling well and HR's are high, has had 5 daily sessions in a row. Will hold off on HD today. Palliative care meeting is pending with family.    Kelly Splinter, MD [facility] pager [phone]   08/22/2017, 9:56 AM  Additional Objective Labs: Basic Metabolic Panel:  Recent Labs Lab 08/16/17 0442  08/19/17 1133 08/20/17 0431 08/21/17 1442  NA 131*  < > 131* 133* 132*  K 4.5  < > 3.9 3.7 4.1  CL 94*  < > 97* 95* 98*  CO2 26  < > 24 28 28   GLUCOSE 92  < > 127* 132* 99  BUN 20  < > 22* 17 27*  CREATININE 3.61*  < > 3.38* 2.87* 3.01*  CALCIUM 8.8*  < > 8.4* 8.3* 8.3*  PHOS 3.9  --  3.9  --  2.9  < > = values in this interval not displayed. Liver Function Tests:  Recent Labs Lab 08/16/17 0442 08/19/17 1133 08/21/17 1442  AST 26  --   --   ALT 12*  --   --   ALKPHOS 175*  --   --   BILITOT 1.5*  --   --   PROT 6.9  --   --   ALBUMIN  3.0* 2.8* 2.7*   CBC:  Recent Labs Lab 08/19/17 1133 08/20/17 0431 08/21/17 0347 08/21/17 1442 08/22/17 0500  WBC 6.6 8.3 6.3 6.7 6.1  HGB 10.4* 9.8* 8.8* 9.5* 10.0*  HCT 32.8* 30.6* 27.3* 30.8* 31.5*  MCV 83.5 83.4 83.2 83.7 83.1  PLT 234 225 177 191 153   Blood Culture    Component Value Date/Time   SDES BLOOD RIGHT HAND 08/19/2017 2050   SPECREQUEST  08/19/2017 2050    BOTTLES DRAWN AEROBIC ONLY Blood Culture adequate volume   CULT NO GROWTH 2 DAYS 08/19/2017 2050   REPTSTATUS PENDING 08/19/2017 2050   CBG:  Recent Labs Lab 08/21/17 0730 08/21/17 1131 08/21/17 1818 08/21/17 2233 08/22/17 0727  GLUCAP 104* 87 106* 146* 79   Medications: . sodium chloride 10 mL/hr at 08/19/17 0613  . sodium chloride    .  ceFAZolin (ANCEF) IV Stopped (08/21/17 2203)  . ferric gluconate (FERRLECIT/NULECIT) IV Stopped (08/21/17 1831)  . heparin 1,550  Units/hr (08/22/17 6435)  . sodium chloride     . allopurinol  150 mg Oral Daily  . atorvastatin  20 mg Oral Daily  . calcium acetate  667 mg Oral TID WC  . darbepoetin (ARANESP) injection - DIALYSIS  100 mcg Intravenous Q Wed-HD  . diclofenac sodium  4 g Topical QID  . feeding supplement (PRO-STAT SUGAR FREE 64)  30 mL Oral BID  . insulin aspart  0-5 Units Subcutaneous QHS  . insulin aspart  0-9 Units Subcutaneous TID WC  . metoprolol succinate  25 mg Oral BID  . midodrine  10 mg Oral TID WC  . pantoprazole  40 mg Oral Daily  . sodium chloride flush  3 mL Intravenous Q12H  . Warfarin - Pharmacist Dosing Inpatient   Does not apply [phone]

## 2017-08-23 DIAGNOSIS — I079 Rheumatic tricuspid valve disease, unspecified: Secondary | ICD-10-CM

## 2017-08-23 DIAGNOSIS — I420 Dilated cardiomyopathy: Secondary | ICD-10-CM

## 2017-08-23 DIAGNOSIS — I272 Pulmonary hypertension, unspecified: Secondary | ICD-10-CM

## 2017-08-23 DIAGNOSIS — Z952 Presence of prosthetic heart valve: Secondary | ICD-10-CM

## 2017-08-23 DIAGNOSIS — I2582 Chronic total occlusion of coronary artery: Secondary | ICD-10-CM

## 2017-08-23 DIAGNOSIS — I2510 Atherosclerotic heart disease of native coronary artery without angina pectoris: Secondary | ICD-10-CM

## 2017-08-23 DIAGNOSIS — N189 Chronic kidney disease, unspecified: Secondary | ICD-10-CM

## 2017-08-23 DIAGNOSIS — Z7189 Other specified counseling: Secondary | ICD-10-CM

## 2017-08-23 LAB — GLUCOSE, CAPILLARY
GLUCOSE-CAPILLARY: 96 mg/dL (ref 65–99)
GLUCOSE-CAPILLARY: 76 mg/dL (ref 65–99)
GLUCOSE-CAPILLARY: 97 mg/dL (ref 65–99)
Glucose-Capillary: 82 mg/dL (ref 65–99)

## 2017-08-23 LAB — CBC
HCT: 31.5 % — ABNORMAL LOW (ref 39.0–52.0)
HCT: 33 % — ABNORMAL LOW (ref 39.0–52.0)
Hemoglobin: 10 g/dL — ABNORMAL LOW (ref 13.0–17.0)
Hemoglobin: 10.6 g/dL — ABNORMAL LOW (ref 13.0–17.0)
MCH: 26.2 pg (ref 26.0–34.0)
MCH: 26.5 pg (ref 26.0–34.0)
MCHC: 31.7 g/dL (ref 30.0–36.0)
MCHC: 32.1 g/dL (ref 30.0–36.0)
MCV: 82.5 fL (ref 78.0–100.0)
MCV: 82.5 fL (ref 78.0–100.0)
Platelets: 89 10*3/uL — ABNORMAL LOW (ref 150–400)
Platelets: 113 10*3/uL — ABNORMAL LOW (ref 150–400)
RBC: 3.82 MIL/uL — ABNORMAL LOW (ref 4.22–5.81)
RBC: 4 MIL/uL — ABNORMAL LOW (ref 4.22–5.81)
RDW: 18.3 % — ABNORMAL HIGH (ref 11.5–15.5)
RDW: 18.3 % — ABNORMAL HIGH (ref 11.5–15.5)
WBC: 7.1 10*3/uL (ref 4.0–10.5)
WBC: 6.4 10*3/uL (ref 4.0–10.5)

## 2017-08-23 LAB — PROTIME-INR
INR: 2.4
Prothrombin Time: 25.9 seconds — ABNORMAL HIGH (ref 11.4–15.2)

## 2017-08-23 LAB — RENAL FUNCTION PANEL
Albumin: 3 g/dL — ABNORMAL LOW (ref 3.5–5.0)
Anion gap: 11 (ref 5–15)
BUN: 20 mg/dL (ref 6–20)
CO2: 25 mmol/L (ref 22–32)
Calcium: 9 mg/dL (ref 8.9–10.3)
Chloride: 94 mmol/L — ABNORMAL LOW (ref 101–111)
Creatinine, Ser: 2.79 mg/dL — ABNORMAL HIGH (ref 0.61–1.24)
GFR calc Af Amer: 27 mL/min — ABNORMAL LOW (ref >60)
GFR calc non Af Amer: 24 mL/min — ABNORMAL LOW (ref >60)
Glucose, Bld: 92 mg/dL (ref 65–99)
Phosphorus: 2.6 mg/dL (ref 2.5–4.6)
Potassium: 4.7 mmol/L (ref 3.5–5.1)
Sodium: 130 mmol/L — ABNORMAL LOW (ref 135–145)

## 2017-08-23 LAB — HEPARIN LEVEL (UNFRACTIONATED): Heparin Unfractionated: 0.24 IU/mL — ABNORMAL LOW (ref 0.30–0.70)

## 2017-08-23 MED ORDER — ALTEPLASE 2 MG IJ SOLR: 2.0000 mg | Freq: Once | INTRAMUSCULAR | Status: DC | PRN

## 2017-08-23 MED ORDER — SODIUM CHLORIDE 0.9 % IV SOLN: 100.0000 mL | INTRAVENOUS | Status: DC | PRN

## 2017-08-23 MED ORDER — LIDOCAINE HCL (PF) 1 % IJ SOLN: 5.0000 mL | INTRAMUSCULAR | Status: DC | PRN

## 2017-08-23 MED ORDER — WARFARIN SODIUM 2 MG PO TABS
3.0000 mg | ORAL_TABLET | Freq: Once | ORAL | Status: AC
  Administered 2017-08-23: 18:00:00 3 mg via ORAL
  Filled 2017-08-23: qty 1

## 2017-08-23 MED ORDER — LIDOCAINE-PRILOCAINE 2.5-2.5 % EX CREA
1.0000 "application " | TOPICAL_CREAM | CUTANEOUS | Status: DC | PRN
  Filled 2017-08-23: qty 5

## 2017-08-23 MED ORDER — PENTAFLUOROPROP-TETRAFLUOROETH EX AERO: 1.0000 "application " | INHALATION_SPRAY | CUTANEOUS | Status: DC | PRN

## 2017-08-23 MED ORDER — HEPARIN SODIUM (PORCINE) 1000 UNIT/ML DIALYSIS
1000.0000 [IU] | INTRAMUSCULAR | Status: DC | PRN
  Filled 2017-08-23: qty 1

## 2017-08-23 NOTE — Progress Notes (Signed)
Patient ID: Michael [name], male   DOB: 01/17/1961, 56 y.o.   MRN: [id number] Asked to recheck left arm. Had seen Dr. Scot Dock earlier in the week. Had a first stage basilic vein transposition by Dr. Bridgett Larsson. Had some swelling with no DVT. He does have some what appears to be serous fluid collection in the antecubital space with no evidence of erythema or infection. Has excellent thrill in his basilic vein. Reassured the patient no treatment is required and this can be addressed the time of the second stage transposition. Will not follow actively. Duplex shows no evidence of DVT yesterday and the fluid with some solid structure nature

## 2017-08-23 NOTE — Progress Notes (Signed)
Daily Progress Note   Patient Name: Michael [name]       Date: 08/23/2017 DOB: Jan 16, 1961  Age: 56 y.o. MRN#: [id number] Attending Physician: Annia Belt, MD Primary Care Physician: [name], MD Admit Date: 08/10/2017  Reason for Consultation/Follow-up: Establishing goals of care  Subjective: Met with patient's daughter, Michael [name] and patient at bedside. Patient sitting up in chair, feels well today. Worked with OT, was able to ambulate.  Patient's daughter is articulate, invested in patient's care. She is studying Armed forces operational officer Resolution at [employer]. She has one year of school left. Reviewed patient's current status, trajectory, and prognosis. Patient and daughter encouraged today d/t patient was able to ambulate with walker.  Discussed patient's poor prognosis d/t severe heart failure r/t valvular issues. Discussed he is having difficulty tolerating HD at times d/t heart failure, hypotension and HD is beginning to become ineffective as patient continues to be volume overloaded. We discussed that patient will likely experience cardiac arrest again in near future. We discussed difficulty of recurrent infections and possible fistula infection.  I attempted to elicit values and goals of care important to the patient.  Patient is very focused on prolonging his life. He is very hopeful to see his daughter graduate in one year. At this point patient does not feel his quality of life is compromised. He would be willing to live in a SNF if that were required. He states as long as he is awake and talking he wants to continue aggressive medical care- including dialysis and CPR. We discussed it is possible he will become unresponsive and require CPR again. Based on his experience of receiving  CPR and surviving, he states he would want CPR if he became unresponsive again. He understands that CPR and intubation will not fix his overall problem of heart and kidney failure. He does note that if her were to be in a persistent vegetative state, that he would not want to continue dialysis, he would not want artificial feeding or hydration, and he would not to be kept alive artificially long term on a ventilator.   The difference between aggressive medical intervention and comfort care was discussed.  Advanced directives, concepts specific to code status, artifical feeding and hydration, and rehospitalization were considered and discussed.  Hospice and Palliative Care services outpatient were explained and  offered. Patient and daughter would like outpatient palliative follow-up.   Questions and concerns were addressed.  Hard Choices booklet left for review. The family was encouraged to call with questions or concerns.    ROS  Length of Stay: 13  Current Medications: Scheduled Meds:  . allopurinol  150 mg Oral Daily  . atorvastatin  20 mg Oral Daily  . calcium acetate  667 mg Oral TID WC  . darbepoetin (ARANESP) injection - DIALYSIS  100 mcg Intravenous Q Wed-HD  . diclofenac sodium  4 g Topical QID  . feeding supplement (PRO-STAT SUGAR FREE 64)  30 mL Oral BID  . insulin aspart  0-5 Units Subcutaneous QHS  . insulin aspart  0-9 Units Subcutaneous TID WC  . metoprolol succinate  25 mg Oral BID  . midodrine  10 mg Oral TID WC  . pantoprazole  40 mg Oral Daily  . sodium chloride flush  3 mL Intravenous Q12H  . warfarin  3 mg Oral ONCE-1800  . Warfarin - Pharmacist Dosing Inpatient   Does not apply q1800    Continuous Infusions: . sodium chloride 10 mL/hr at 08/19/17 0613  . sodium chloride    .  ceFAZolin (ANCEF) IV Stopped (08/22/17 2216)  . ferric gluconate (FERRLECIT/NULECIT) IV Stopped (08/21/17 1831)  . sodium chloride      PRN Meds: sodium chloride, acetaminophen,  HYDROcodone-acetaminophen, morphine injection, senna-docusate, sodium chloride, sodium chloride flush  Physical Exam  Constitutional: He is oriented to person, place, and time.  Cardiovascular: Normal rate and regular rhythm.   Pulmonary/Chest: Effort normal.  Abdominal: He exhibits distension. There is no tenderness.  Neurological: He is alert and oriented to person, place, and time.  Skin: Skin is warm and dry.  Psychiatric: He has a normal mood and affect. His behavior is normal. Judgment and thought content normal.            Vital Signs: BP 94/65 (BP Location: Right Arm)   Pulse 72   Temp 98 F (36.7 C) (Oral)   Resp 16   Ht '5\' 5"'  (1.651 m)   Wt 72.9 kg (160 lb 11.5 oz)   SpO2 94%   BMI 26.74 kg/m  SpO2: SpO2: 94 % O2 Device: O2 Device: Not Delivered O2 Flow Rate: O2 Flow Rate (L/min): 2 L/min  Intake/output summary:  Intake/Output Summary (Last 24 hours) at 08/23/17 1355 Last data filed at 08/23/17 0400  Gross per 24 hour  Intake          1405.39 ml  Output             2500 ml  Net         -1094.61 ml   LBM: Last BM Date: 08/22/17 Baseline Weight: Weight: 76.2 kg (167 lb 14.4 oz) Most recent weight: Weight: 72.9 kg (160 lb 11.5 oz)       Palliative Assessment/Data: PPS: 50%     Patient Active Problem List   Diagnosis Date Noted  . Pulmonary hypertension, unspecified ([place])   . Chest pain   . Biventricular heart failure ([place]) 08/10/2017  . Syncope and collapse 08/10/2017  . Cardiac arrest ([place])   . Benign neoplasm of ascending colon   . Rectal bleeding   . ESRD (end stage renal disease) on dialysis ([place])   . Goals of care, counseling/discussion   . Palliative care by specialist   . Acute kidney injury ([place]) 06/25/2017  . Anasarca 05/30/2017  . HLD (hyperlipidemia) 10/05/2016  . Mouth bleeding 10/05/2016  .  Bilateral knee pain 10/05/2016  . Petechial rash 10/05/2016  . Laceration of tongue   . Insomnia 06/10/2016  . Supratherapeutic INR 06/01/2016    . Acute hepatic encephalopathy 03/15/2016  . Liver cirrhosis ([place]) 03/15/2016  . Volume overload 03/15/2016  . Acute congestive heart failure ([place])   . Insulin dependent diabetes mellitus ([place])   . Acute renal failure (ARF) ([place]) 03/14/2016  . GERD (gastroesophageal reflux disease) 03/04/2016  . Aortic regurgitation 02/27/2016  . Viral gastroenteritis 02/27/2016  . A-fib ([place]) 02/26/2016  . Abdominal pain 02/25/2016  . Diabetes mellitus with complication ([place]) 33/29/5188  . Permanent atrial fibrillation ([place]) 02/25/2016  . Anemia, chronic disease 02/25/2016  . Pain in joint, ankle and foot 01/10/2016  . Asthma   . Acute hypoxemic respiratory failure ([place]) 12/29/2015  . Acute respiratory failure ([place]) 12/29/2015  . Chronic atrial fibrillation ([place]) 12/24/2015  . Dilated cardiomyopathy ([place]) 12/24/2015  . Polypharmacy 11/23/2015  . Gout 10/23/2015  . Encounter for therapeutic drug monitoring 10/19/2015  . Anemia - mild exacerbation 10/15/2015  . Tricuspid valve disease 10/09/2015  . Mitral valve disease   . CKD (chronic kidney disease), stage IV ([place]) 09/10/2015  . Calf pain 09/10/2015  . DM type 2 (diabetes mellitus, type 2) ([place]) 09/10/2015  . Essential hypertension 09/10/2015    Palliative Care Assessment & Plan   Patient Profile: 56 y.o. male  with past medical history of ESRD on HD, A. Fib, heart failure (EF 15%), s/p MV and tricuspid valve replacements, gout, DM, recently discharged home after prolonged hospitalization for acute on chronic aki r/t to advanced heart failure during which HD was initiated. He admitted on 08/10/2017 following loss of consciousness, likely PEA arrest during an outpatient dialysis session. He received approx. 2 mins of CPR, no shock, with resumption of circulation.  On 9/26 during dialysis he had another episode of unresponsiveness during hemodialysis- with hypotension, chest pain, and a. Fib with RVR in 140's.  Myoview scan shows 100% occluded RCA  with collateral vessels. Pt with R sided heart failure  Palliative medicine consulted for goals/limits in care conversations.   Assessment/Recommendations/Plan   Continue current level of care  Attempt to continue dialysis- frank discussion was had with family that patient will become unreponsive again with dialysis at some point. We discussed patient's EOL preferences, GOC. Patient's GOC are to prolong life as long as possible. As long as HD and other interventions are offered as an option, patient and family will choose these. However, family also understands that patient's heart is at point where functional recovery is not possible. Patient and family are also aware that there may be a time when HD is no longer offered as an option from nephrologist.  PT eval for safety and placement recommendations- patient ambulating and wishes to go home- but would be willing to go to SNF rehab- agrees to palliative f/u at rehab facility- this would need to be arranged by attending MD at rehab- if patient goes home then can refer to [facility] for outpatient Palliative  Goals of Care and Additional Recommendations:  Limitations on Scope of Treatment: Full Scope Treatment  Code Status:  Full code  Prognosis:   < 6 months likely less, d/t advanced heart failure, ESRD on HD (tolerating poorly)  Discharge Planning:  To Be Determined  Care plan was discussed with patient and daughter.  Thank you for allowing the Palliative Medicine Team to assist in the care of this patient.   Time In: 1300 Time Out:  1430 Total Time 90 mins Prolonged Time Billed Yes      Greater than 50%  of this time was spent counseling and coordinating care related to the above assessment and plan.  Mariana Kaufman, AGNP-C Palliative Medicine   Please contact Palliative Medicine Team phone at [phone] for questions and concerns.

## 2017-08-23 NOTE — Progress Notes (Signed)
Medicine attending: I examined this patient today together with resident physician Dr. Neva Seat and I concur with his evaluation and management plan which we discussed together.  No acute clinical change.  No dyspnea at rest.  Left arm site of recent vascular surgery appears stable.  Minimal to no residual erythema compared with what other examiners have reported.  But he remains afebrile.  Weight 161 compared with 168 on September 24. Creatinine overall improved since admission with peak value 4.3 on September 28, current value 3.0. Persistent decreased breath sounds right lung base and dull to percussion due to known right pleural effusion.  No S3 gallop.  No JVD.  No peripheral edema.  Rumbling 2-3/6 murmur at the left sternal border. Clinical summary: 56 year old Asian man with end-stage renal disease on dialysis admitted on September 24 after he had a  cardiac arrest during a dialysis session.  He was successfully resuscitated.  He has developed refractory right sided heart failure.  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  He has severe tricuspid regurgitation and pulmonary hypertension.  He had a bio prosthetic mitral valve replacement in 2012 and a tricuspid valve repair in the past.  Previous coronary bypass surgery in 2011?Marland Kitchen He developed cellulitis on the same arm as his vascular graft.  Initial improvement on antibiotics but when they were discontinued on October 1, he developed recurrent fever of 103 degrees and October 3 antibiotics were resumed.    He is currently afebrile.  No new recommendations from vascular surgery today. Discussion on whether or not to continue dialysis support in progress.  Due to his unstable cardiac status, he does not appear to be able to handle the volume shifts associated with dialysis.  I am not sure whether he would be a candidate for peritoneal dialysis. Overall prognosis remains guarded.

## 2017-08-23 NOTE — Progress Notes (Signed)
Subjective:  Patient was seen sitting comfortably in his chair this morning. He states he feels okay today. He states has not had any pain overnight or so far this morning. He has no complaints this morning. He states that he want to try to walk around more today as he has been sitting in bed and in his chair a lot since admission. His daughter is coming by today to meet with the palliative care team.  Objective:  Vital signs in last 24 hours: Vitals:   08/22/17 2025 08/22/17 2130 08/23/17 0000 08/23/17 0918  BP: (!) 85/62 103/65 95/62 (!) 88/60  Pulse: 90 85  (!) 46  Resp: 19 16 15 12   Temp:  97.7 F (36.5 C) 98.3 F (36.8 C)   TempSrc:  Oral Oral   SpO2: 100% 98% 97% 96%  Weight:      Height:       Physical Exam  Constitutional: He is oriented to person, place, and time and well-developed, well-nourished, and in no distress.  HENT:  Head: Normocephalic and atraumatic.  Eyes: EOM are normal. Right eye exhibits no discharge. Left eye exhibits no discharge.  Cardiovascular: Exam reveals no gallop and no friction rub.   No murmur heard. Irregularly irregular rhythm  Pulmonary/Chest: Effort normal and breath sounds normal. No respiratory distress. He has no wheezes.  Reduced breath sounds at right base  Abdominal: Soft. He exhibits no distension. There is no tenderness.  Musculoskeletal: He exhibits no edema.  Hyperpigmented skin over left forearm  Neurological: He is alert and oriented to person, place, and time.  Skin: Skin is warm and dry.    Assessment/Plan:  Mr. Bolds is a 56 yo M with a history of DM, ESRD on HD MWFSat, HTN, Chronic A. 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  Cardiac arrest/syncope: Patient presented to the ED after he became unresponsive, pulseless and apneic during his  hemodialysis session. CPR was performed and patient became responsive and had ROSC.  He had a Myoview that showed reversibility in mid inferior wall. EF greater than 50%. Cardiac cath on 10/1 was delayed to 10/3 due to elevated INR. 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   Left upper extremity cellulitis During admission patient complained of increasing left forearm swelling and pain. There was concern for a clotted fistula. Left upper extremity Doppler was negative. He was started on Vanc and cefepime for suspected cellulitis vs thrombophlebitis. This was de-escalated to cefazolin and patient improved. He completed a 7 day course. 1 day after discontinuation of antibiotic (and after a heart cath and HD that day) patient was found to be tachycardia and bebrile (101.8, axillary) with increased warmth and edema in LUE (10/3). Concern for recurrent Cellulitis vs Thrombophlebitis. Blood cultures pending. LUE Venous duplex showed: No evidence of DVT or superficial thrombosis and fluid collection with mixed internal echoes noted anterior to AVF.  - Vascular surgery consult - Cefazolin  Chronic Atrial fibrillation Patient had an episode of A. Fib with RVR in hemodialysis 9/26 while inpatient. Patient was started on warfarin amiodarone and metoprolol. Patient is currently on metoprolol. His evening dose has been held ion multiple occasions due to concern over hypotension. - Discontinue heparin - Warfarin - Metorprolol  ESRD on HD MWFS: Patient has experienced significant difficulties with HD throughout his hospitalization. He continues to have volume overload despite attempts at HD. He also has chronic hypotension which is exacerbated by HD. In the setting of his severe right heart failure and this failure of HD, palliative care has been consulted by nephrology and are working to discuss goals of  care with the patient and his family. Increased asymptomatic pleural effusion on CXR (10/4). - Nephrology following - Midodrine  Right 4th digit pain and swelling Suspected gout flair. Patient states this is similar to previous flairs. Resolved with 5d course of Prednisone.   DM  On 10 units Levemir qhsat home, currently has run out.  - SSI  Elevated ALP Patient has persistent elevated ALP. Abdominal ultrasound in the past has revealed gallbladder wall thickening. He also has persistent elevation in bilirubin. Based on imaging in the past including CT scan and ultrasound of abdomen there is suggestion of liver cirrhosis.  Patient has currently multiple medical issues and once he has improved would consider repeat abdominal ultrasound for further evaluation.   Dispo: Anticipated discharge pending clinical course.  Neva Seat, MD 08/23/2017, 10:49 AM Pager: [phone]

## 2017-08-23 NOTE — Progress Notes (Signed)
[facility] Progress Note   Subjective: Up in chair. No new complaints. On RA says breathing is "good".    Objective Vitals:   08/22/17 1520 08/22/17 2025 08/22/17 2130 08/23/17 0000  BP: (!) 114/54 (!) 85/62 103/65 95/62  Pulse: 83 90 85   Resp: 10 19 16 15   Temp: 97.9 F (36.6 C)  97.7 F (36.5 C) 98.3 F (36.8 C)  TempSrc: Oral  Oral Oral  SpO2: 98% 100% 98% 97%  Weight: 72.9 kg (160 lb 11.5 oz)     Height:       Physical Exam General: Pleasant, NAD Heart: regularly irregular C0,K3 2/6 systolic M Lungs: Slightly dec in bases otherwise CTAB Abdomen: active BS Extremities: No LE edema. Still trace edema upper thighs, abdomen.  Dialysis Access: R[facility] CDI. Maturing RUA AVF + bruit   Additional Objective Labs: Basic Metabolic Panel:  Recent Labs Lab 08/19/17 1133 08/20/17 0431 08/21/17 1442  NA 131* 133* 132*  K 3.9 3.7 4.1  CL 97* 95* 98*  CO2 24 28 28   GLUCOSE 127* 132* 99  BUN 22* 17 27*  CREATININE 3.38* 2.87* 3.01*  CALCIUM 8.4* 8.3* 8.3*  PHOS 3.9  --  2.9   Liver Function Tests:  Recent Labs Lab 08/19/17 1133 08/21/17 1442  ALBUMIN 2.8* 2.7*   No results for input(s): LIPASE, AMYLASE in the last 168 hours. CBC:  Recent Labs Lab 08/20/17 0431 08/21/17 0347 08/21/17 1442 08/22/17 0500 08/23/17 0024  WBC 8.3 6.3 6.7 6.1 7.1  HGB 9.8* 8.8* 9.5* 10.0* 10.0*  HCT 30.6* 27.3* 30.8* 31.5* 31.5*  MCV 83.4 83.2 83.7 83.1 82.5  PLT 225 177 191 153 89*   Blood Culture    Component Value Date/Time   SDES BLOOD RIGHT HAND 08/19/2017 2050   SPECREQUEST  08/19/2017 2050    BOTTLES DRAWN AEROBIC ONLY Blood Culture adequate volume   CULT NO GROWTH 3 DAYS 08/19/2017 2050   REPTSTATUS PENDING 08/19/2017 2050    Cardiac Enzymes: No results for input(s): CKTOTAL, CKMB, CKMBINDEX, TROPONINI in the last 168 hours. CBG:  Recent Labs Lab 08/22/17 0727 08/22/17 1133 08/22/17 1618 08/22/17 2243 08/23/17 0743  GLUCAP 79 112*  72 101* 96   Iron Studies: No results for input(s): IRON, TIBC, TRANSFERRIN, FERRITIN in the last 72 hours. @lablastinr3 @ Studies/Results: No results found. Medications: . sodium chloride 10 mL/hr at 08/19/17 0613  . sodium chloride    .  ceFAZolin (ANCEF) IV Stopped (08/22/17 2216)  . ferric gluconate (FERRLECIT/NULECIT) IV Stopped (08/21/17 1831)  . sodium chloride     . allopurinol  150 mg Oral Daily  . atorvastatin  20 mg Oral Daily  . calcium acetate  667 mg Oral TID WC  . darbepoetin (ARANESP) injection - DIALYSIS  100 mcg Intravenous Q Wed-HD  . diclofenac sodium  4 g Topical QID  . feeding supplement (PRO-STAT SUGAR FREE 64)  30 mL Oral BID  . insulin aspart  0-5 Units Subcutaneous QHS  . insulin aspart  0-9 Units Subcutaneous TID WC  . metoprolol succinate  25 mg Oral BID  . midodrine  10 mg Oral TID WC  . pantoprazole  40 mg Oral Daily  . sodium chloride flush  3 mL Intravenous Q12H  . warfarin  3 mg Oral ONCE-1800  . Warfarin - Pharmacist Dosing Inpatient   Does not apply q1800   HD orders: MWFS [facility] 4 hr 180 NRe 400/800  75 Kg  2.0 K/2.0 Ca  Heparin  4200 units IV q Tx Venofer 50 mg IV weekly Mircera 200 mcg IV q 2 weeks (last dose 08/05/17   Assessment/Plan: 1. S/P cardiac arrest: Brief CPR 3-5 minutes before ROSC.  2. Chronic R Heart Failure: Monitor volume carefully. Continue HD 4 days a week.  3. AFIB RVR: on coumadin/metoprolol. Rate controlled today.  4. S/P MVR/TVR 5. ESRD -MWFS-Has had serial HD for volume removal. Looks much better. Next HD tomorrow.  6. Anemia - HGB 10.0. Last ESA 08/22/17. Continue weekly ESA/Follow HGB.  7. Secondary hyperparathyroidism - Check labs with HD tomorrow. Continue binders.  8. HTN/volume - HD 10/06 Pre wt 75.3 kg Net UF 2.5 liters Post wt 72.9 kg. Lower EDW on DC. Edema appears to have improved, lungs clear on exam.  Attempt UFG 1.5-2 liters tomorrow as tolerated.  9. Nutrition - Albumin 2.7. Prostat. 10. Palliative  care consult: currently full code. Family meeting w palliative care is pending. Poor overall prognosis due to #2.  11. LUE Cellulitis/Elevated temps: Has been on ancef per primary-BC NG 3 days. MRSA PCR negative. Stop ABX???  Rita H. Brown NP-C 08/23/2017, 9:26 AM  [facility] [phone]  Pt seen, examined, agree w assess/plan as above with additions as indicated.  Kelly Splinter MD [employer] pager [phone]    cell [phone] 08/23/2017, 10:49 AM

## 2017-08-23 NOTE — Plan of Care (Signed)
Problem: Pain Managment: Goal: General experience of comfort will improve Outcome: Progressing Pt better managed throughout night. Denied any pain majority of shift. Will continue to monitor.  Problem: Physical Regulation: Goal: Ability to maintain clinical measurements within normal limits will improve Outcome: Progressing BP still occasionally low. Metroprolol held for SBP<90. Other VSS. Pt denies any SOB/DIB. Tolerated activity. Transferred to bedside commode w/ cane. Will Continue to monitor.  Problem: Skin Integrity: Goal: Risk for impaired skin integrity will decrease Outcome: Progressing Pt has small  wound bilateral buttocks. Barrier cream applied. Instructed pt to keep area clean/dry, Reposition Q2 hours. Pt able to demonstrate repositioning in bed. Will continue to monitor. Will continue to monitor.

## 2017-08-23 NOTE — Evaluation (Signed)
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 Fib, HFpEF, GERD, S/P Mitral Valve replacement and Tricuspid valve repair, recently admitted from 8/9 - 9/10 for Acute on Chronic HF and AKI who presents following an episode of syncope and collapse at dialysis today.   Clinical Impression   Pt reports he was independent with ADL PTA. Currently pt requires min assist overall for ADL and min guard for functional mobility with use of RW. Pt presenting with L hand limited digit flexion and pain impacting his independence and safety with ADL. Pt planning to d/c home alone with intermittent supervision from family. Recommending HHOT at this time to maximize independence and safety with ADL and functional mobility upon return home. Pt would benefit from continued skilled OT to address established goals.    Follow Up Recommendations  Home health OT    Equipment Recommendations  None recommended by OT    Recommendations for Other Services PT consult     Precautions / Restrictions Precautions Precautions: Fall Restrictions Weight Bearing Restrictions: No      Mobility Bed Mobility               General bed mobility comments: Pt OOB in chair upon arrival  Transfers Overall transfer level: Needs assistance Equipment used: Rolling walker (2 wheeled) Transfers: Sit to/from Stand Sit to Stand: Min guard         General transfer comment: for safety, cues for hand placement    Balance Overall balance assessment: Needs assistance Sitting-balance support: Feet supported;No upper extremity supported Sitting balance-Leahy Scale: Good     Standing balance support: Single extremity supported Standing balance-Leahy Scale: Fair                             ADL either performed or assessed with  clinical judgement   ADL Overall ADL's : Needs assistance/impaired Eating/Feeding: Set up;Sitting   Grooming: Minimal assistance;Sitting   Upper Body Bathing: Minimal assistance;Sitting   Lower Body Bathing: Minimal assistance;Sit to/from stand   Upper Body Dressing : Minimal assistance;Sitting   Lower Body Dressing: Minimal assistance;Sit to/from stand Lower Body Dressing Details (indicate cue type and reason): Pt with difficulty adjusting socks due to L hand limited ROM Toilet Transfer: Min guard;Ambulation;RW Toilet Transfer Details (indicate cue type and reason): Simulated by sit to stand from chair with functional mobility         Functional mobility during ADLs: Min guard;Rolling walker       Vision         Perception     Praxis      Pertinent Vitals/Pain Pain Assessment: Faces Faces Pain Scale: Hurts even more Pain Location: L hand with digit flexion Pain Descriptors / Indicators: Grimacing;Guarding Pain Intervention(s): Monitored during session     Hand Dominance Right   Extremity/Trunk Assessment Upper Extremity Assessment Upper Extremity Assessment: LUE deficits/detail LUE Deficits / Details: Limited digit flexion- pt reports painful and ROM improving since PTA LUE Coordination: decreased fine motor;decreased gross motor   Lower Extremity Assessment Lower Extremity Assessment: Defer to PT evaluation   Cervical / Trunk Assessment Cervical / Trunk Assessment: Normal   Communication Communication Communication: Prefers language other than English   Cognition Arousal/Alertness: Awake/alert Behavior During Therapy: WFL for tasks assessed/performed Overall Cognitive Status: Within Functional Limits  for tasks assessed                                     General Comments       Exercises     Shoulder Instructions      Home Living Family/patient expects to be discharged to:: Private residence Living Arrangements: Alone Available  Help at Discharge: Friend(s);Available PRN/intermittently Type of Home: Apartment Home Access: Stairs to enter Entrance Stairs-Number of Steps: 2 Entrance Stairs-Rails: None Home Layout: One level     Bathroom Shower/Tub: Teacher, early years/pre: Standard Bathroom Accessibility: Yes   Home Equipment: Cane - single point;Shower seat;Walker - 2 wheels          Prior Functioning/Environment Level of Independence: Independent with assistive device(s)        Comments: Cane for mobility, independent with ADL        OT Problem List: Decreased strength;Decreased range of motion;Decreased activity tolerance;Impaired balance (sitting and/or standing);Decreased knowledge of use of DME or AE;Impaired UE functional use;Pain;Increased edema      OT Treatment/Interventions: Self-care/ADL training;Therapeutic exercise;Energy conservation;DME and/or AE instruction;Therapeutic activities;Patient/family education;Balance training    OT Goals(Current goals can be found in the care plan section) Acute Rehab OT Goals Patient Stated Goal: get better and back home OT Goal Formulation: With patient Time For Goal Achievement: 09/06/17 Potential to Achieve Goals: Good ADL Goals Pt Will Perform Grooming: with modified independence;standing Pt Will Perform Upper Body Bathing: with modified independence;sitting Pt Will Perform Lower Body Bathing: with modified independence;sit to/from stand Pt Will Transfer to Toilet: with modified independence;ambulating;regular height toilet Pt Will Perform Toileting - Clothing Manipulation and hygiene: with modified independence;sit to/from stand Pt Will Perform Tub/Shower Transfer: Tub transfer;with modified independence;ambulating;shower seat  OT Frequency: Min 2X/week   Barriers to D/C: Decreased caregiver support  pt lives alone       Co-evaluation              AM-PAC PT "6 Clicks" [name] Activity     Outcome Measure Help from another  person eating meals?: A Little Help from another person taking care of personal grooming?: A Little Help from another person toileting, which includes using toliet, bedpan, or urinal?: A Little Help from another person bathing (including washing, rinsing, drying)?: A Little Help from another person to put on and taking off regular upper body clothing?: A Little Help from another person to put on and taking off regular lower body clothing?: A Little 6 Click Score: 18   End of Session Equipment Utilized During Treatment: Gait belt;Rolling walker  Activity Tolerance: Patient tolerated treatment well Patient left: in chair;with call bell/phone within reach  OT Visit Diagnosis: Unsteadiness on feet (R26.81);Muscle weakness (generalized) (M62.81);Pain Pain - Right/Left: Left Pain - part of body: Hand                Time: 1152-1205 OT Time Calculation (min): 13 min Charges:  OT General Charges $OT Visit: 1 Visit OT Evaluation $OT Eval Moderate Complexity: 1 Mod G-Codes:     Mayur Duman A. Ulice Brilliant, M.S., OTR/L Pager: [place] 08/23/2017, 12:16 PM

## 2017-08-23 NOTE — Evaluation (Signed)
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 Fib, HFpEF, GERD, S/P Mitral Valve replacement and Tricuspid valve repair, recently admitted from 8/9 - 9/10 for Acute on Chronic HF and AKI who presents following an episode of syncope and collapse at dialysis today.  Clinical Impression   Pt admitted with above diagnosis. Pt currently with functional limitations due to the deficits listed below (see PT Problem List). Overall moving well, able to ambulate in hallway with supervision and quad cane; Agree with [facility] services upon DC;  Pt will benefit from skilled PT to increase their independence and safety with mobility to allow discharge to the venue listed below.       Follow Up Recommendations Home health PT    Equipment Recommendations  Rolling walker with 5" wheels (does he already have one?)    Recommendations for Other Services       Precautions / Restrictions Precautions Precautions: Fall Precaution Comments: Fall risk greatly reduced with an assistive device      Mobility  Bed Mobility               General bed mobility comments: Pt OOB in chair upon arrival  Transfers Overall transfer level: Needs assistance   Transfers: Sit to/from Stand Sit to Stand: Min guard         General transfer comment: for safety, cues for hand placement  Ambulation/Gait Ambulation/Gait assistance: Min guard Ambulation Distance (Feet): 70 Feet Assistive device: Quad cane (pt's own) Gait Pattern/deviations: Step-through pattern;Decreased stride length;Trunk flexed;Drifts right/left     General Gait Details: cues for posture, and to self-monitor for activity tolerance  Stairs            Wheelchair Mobility    Modified Rankin (Stroke Patients Only)       Balance     Sitting balance-Leahy Scale:  Good       Standing balance-Leahy Scale: Fair                               Pertinent Vitals/Pain Pain Assessment: No/denies pain Pain Intervention(s): Monitored during session    Home Living Family/patient expects to be discharged to:: Private residence Living Arrangements: Alone Available Help at Discharge: Friend(s);Available PRN/intermittently Type of Home: Apartment Home Access: Stairs to enter Entrance Stairs-Rails: None Entrance Stairs-Number of Steps: 2 Home Layout: One level Home Equipment: Cane - single point;Shower seat;Walker - 2 wheels Additional Comments: Per chart review, Son helps but works during the day.  Daughter lives in town as well.    Prior Function Level of Independence: Independent with assistive device(s)         Comments: Cane for mobility, independent with ADL     Hand Dominance   Dominant Hand: Right    Extremity/Trunk Assessment   Upper Extremity Assessment Upper Extremity Assessment: Defer to OT evaluation    Lower Extremity Assessment Lower Extremity Assessment: Generalized weakness    Cervical / Trunk Assessment Cervical / Trunk Assessment: Normal  Communication   Communication: Prefers language other than English ([name]; but good functional understanding of [place])  Cognition Arousal/Alertness: Awake/alert Behavior During Therapy: WFL for tasks assessed/performed Overall Cognitive Status: Within Functional Limits for tasks assessed  General Comments General comments (skin integrity, edema, etc.): VSS    Exercises     Assessment/Plan    PT Assessment Patient needs continued PT services  PT Problem List Decreased strength;Decreased activity tolerance;Decreased balance;Decreased mobility;Decreased coordination;Decreased cognition;Decreased knowledge of use of DME;Decreased safety awareness;Decreased knowledge of precautions       PT Treatment  Interventions DME instruction;Gait training;Stair training;Functional mobility training;Therapeutic activities;Therapeutic exercise;Balance training;Patient/family education    PT Goals (Current goals can be found in the Care Plan section)  Acute Rehab PT Goals Patient Stated Goal: get better and back home; noted in Palliative Care note he wants to see his daughter graduate from [employer] [facility] next year PT Goal Formulation: With patient Time For Goal Achievement: 09/06/17 Potential to Achieve Goals: Good    Frequency Min 3X/week   Barriers to discharge Decreased caregiver support Lives alone; family checks in and friends bring food prn    Co-evaluation               AM-PAC PT "6 Clicks" Daily Activity  Outcome Measure Difficulty turning over in bed (including adjusting bedclothes, sheets and blankets)?: None Difficulty moving from lying on back to sitting on the side of the bed? : None Difficulty sitting down on and standing up from a chair with arms (e.g., wheelchair, bedside commode, etc,.)?: A Little Help needed moving to and from a bed to chair (including a wheelchair)?: A Little Help needed walking in hospital room?: A Little Help needed climbing 3-5 steps with a railing? : A Little 6 Click Score: 20    End of Session   Activity Tolerance: Patient tolerated treatment well Patient left: in chair;with call bell/phone within reach Nurse Communication: Mobility status PT Visit Diagnosis: Unsteadiness on feet (R26.81);Muscle weakness (generalized) (M62.81)    Time: 1884-1660 PT Time Calculation (min) (ACUTE ONLY): 13 min   Charges:   PT Evaluation $PT Eval Moderate Complexity: 1 Mod     PT G Codes:        Roney Marion, PT  Acute Rehabilitation Services Pager [phone] Office [phone]   Colletta [place] 08/23/2017, 5:50 PM

## 2017-08-23 NOTE — Progress Notes (Signed)
ANTICOAGULATION CONSULT NOTE - Follow Up Consult  Pharmacy Consult for heparin Indication: atrial fibrillation   Labs:  Recent Labs  08/20/17 0431 08/21/17 0347  08/21/17 1442 08/22/17 0500 08/22/17 1300 08/23/17 0024  HGB 9.8* 8.8*  --  9.5* 10.0*  --  10.0*  HCT 30.6* 27.3*  --  30.8* 31.5*  --  31.5*  PLT 225 177  --  191 153  --  PENDING  LABPROT 18.5* 17.6*  --   --  20.7*  --  25.9*  INR 1.55 1.46  --   --  1.79  --  2.40  HEPARINUNFRC 0.45 0.15*  < >  --  0.15* 0.25* 0.24*  CREATININE 2.87*  --   --  3.01*  --   --   --   < > = values in this interval not displayed.   Assessment: 56yo male remains subtherapeutic on heparin though INR now at goal.  Goal of Therapy:  Heparin level 0.3-0.7 units/ml   Plan:  Will increase heparin gtt by 2 units/kg/hr to 1800 units/hr and check level in 8hr; f/u w/ rounding team in am whether okay to d/c heparin.  Wynona Neat, PharmD, BCPS  08/23/2017,1:52 AM

## 2017-08-23 NOTE — Plan of Care (Signed)
Problem: Health Behavior/Discharge Planning: Goal: Ability to manage health-related needs will improve Outcome: Progressing Palliative Consult. Ambulates in hallway with PT

## 2017-08-23 NOTE — Progress Notes (Signed)
ANTICOAGULATION CONSULT NOTE  Pharmacy Consult to Warfarin Indication: atrial fibrillation   No Known Allergies  Vital Signs: Temp: 98.3 F (36.8 C) (10/07 0000) Temp Source: Oral (10/07 0000) BP: 95/62 (10/07 0000) Pulse Rate: 85 (10/06 2130)  Labs:  Recent Labs  08/21/17 0347  08/21/17 1442 08/22/17 0500 08/22/17 1300 08/23/17 0024  HGB 8.8*  --  9.5* 10.0*  --  10.0*  HCT 27.3*  --  30.8* 31.5*  --  31.5*  PLT 177  --  191 153  --  89*  LABPROT 17.6*  --   --  20.7*  --  25.9*  INR 1.46  --   --  1.79  --  2.40  HEPARINUNFRC 0.15*  < >  --  0.15* 0.25* 0.24*  CREATININE  --   --  3.01*  --   --   --   < > = values in this interval not displayed.  Estimated Creatinine Clearance: 23.6 mL/min (A) (by C-G formula based on SCr of 3.01 mg/dL (H)).  Assessment: 56yom with suspected cardiac arrest at HD center. On coumadin pta for afib/bioprosthetic MVR. S/p cath, had resumed heparin and warfarin, heparin was discontinued today.  INR therapeutic today at 2.40 today s/p vit K requiring larger doses than home.  Likely overcoming vit K and will decrease closer to home dosing.  CBC stable and no s/s bleeding reported today.  Home warfarin dose: 2.5mg  daily  Goal of Therapy:  INR 2-3  Monitor platelets by anticoagulation protocol: Yes   Plan:  Warfarin 3mg  PO x 1 tonight Daily INR, CBC, s/s bleeding  Bertis Ruddy, PharmD Pharmacy Resident Pager #: [phone] 08/23/2017 8:28 AM

## 2017-08-24 ENCOUNTER — Other Ambulatory Visit: Payer: Self-pay

## 2017-08-24 LAB — GLUCOSE, CAPILLARY
GLUCOSE-CAPILLARY: 82 mg/dL (ref 65–99)
GLUCOSE-CAPILLARY: 97 mg/dL (ref 65–99)
GLUCOSE-CAPILLARY: 105 mg/dL — AB (ref 65–99)

## 2017-08-24 LAB — CULTURE, BLOOD (ROUTINE X 2)
Culture: NO GROWTH
Culture: NO GROWTH
Special Requests: ADEQUATE
Special Requests: ADEQUATE

## 2017-08-24 LAB — RENAL FUNCTION PANEL
ALBUMIN: 2.9 g/dL — AB (ref 3.5–5.0)
ANION GAP: 10 (ref 5–15)
BUN: 21 mg/dL — AB (ref 6–20)
CALCIUM: 9 mg/dL (ref 8.9–10.3)
CO2: 26 mmol/L (ref 22–32)
Chloride: 93 mmol/L — ABNORMAL LOW (ref 101–111)
Creatinine, Ser: 3.26 mg/dL — ABNORMAL HIGH (ref 0.61–1.24)
GFR calc Af Amer: 23 mL/min — ABNORMAL LOW (ref >60)
GFR, EST NON AFRICAN AMERICAN: 20 mL/min — AB (ref >60)
Glucose, Bld: 78 mg/dL (ref 65–99)
PHOSPHORUS: 3.2 mg/dL (ref 2.5–4.6)
POTASSIUM: 3.8 mmol/L (ref 3.5–5.1)
SODIUM: 129 mmol/L — AB (ref 135–145)

## 2017-08-24 LAB — PROTIME-INR
INR: 3.18
PROTHROMBIN TIME: 32.4 s — AB (ref 11.4–15.2)

## 2017-08-24 LAB — CBC
HCT: 32 % — ABNORMAL LOW (ref 39.0–52.0)
Hemoglobin: 10.2 g/dL — ABNORMAL LOW (ref 13.0–17.0)
MCH: 26.2 pg (ref 26.0–34.0)
MCHC: 31.9 g/dL (ref 30.0–36.0)
MCV: 82.3 fL (ref 78.0–100.0)
Platelets: 92 10*3/uL — ABNORMAL LOW (ref 150–400)
RBC: 3.89 MIL/uL — ABNORMAL LOW (ref 4.22–5.81)
RDW: 18.6 % — ABNORMAL HIGH (ref 11.5–15.5)
WBC: 5.8 10*3/uL (ref 4.0–10.5)

## 2017-08-24 MED ORDER — MORPHINE SULFATE (PF) 4 MG/ML IV SOLN
INTRAVENOUS | Status: AC
  Administered 2017-08-24: 2 mg via ARTERIOVENOUS_FISTULA
  Filled 2017-08-24: qty 1

## 2017-08-24 NOTE — Plan of Care (Signed)
Problem: Safety: Goal: Ability to remain free from injury will improve Outcome: Progressing Bed alarm on. No new skin damage this shift. No falls or other injuries. Pt repositioned q2h. Pt can turn himself with assistance and cueing. Will continue to monitor and assist.  Problem: Cardiac: Goal: Ability to achieve and maintain adequate cardiopulmonary perfusion will improve Outcome: Progressing Pt remains in A. Fib. HR 70-105. Bp 102/67. Pt denies dizziness or weakness. No complaints of pain. Will continue to moinitor

## 2017-08-24 NOTE — Progress Notes (Signed)
PT Cancellation Note  Patient Details Name: Michael [name] MRN: [id number] DOB: August 02, 1961   Cancelled Treatment:    Reason Eval/Treat Not Completed: Other (comment).  Pt asleep and did not wake up for PT but has just returned from HD.  Try again tomorrow around HD schedule.   Ramond Dial 08/24/2017, 3:00 PM   Mee Hives, PT MS Acute Rehab Dept. Number: [place]

## 2017-08-24 NOTE — Progress Notes (Signed)
Subjective:  Patient was seen while receiving HD this morning. He states he is more tired today, but feels otherwise okay. He states has not had any pain overnight or so far this morning. He was able to walk around some yesterday with just his IV pole for support. He states today that he feels like he would be able to go home, PT and OT will continue to reevaluate.  He and his daughter met with the palliative care team yesterday. They had an extensive discussion on goals of care. Per the note following this encounter:  "As long as HD and other interventions are offered as an option, patient and family will choose these. However, family also understands that patient's heart is at point where functional recovery is not possible. Patient and family are also aware that there may be a time when HD is no longer offered as an option from nephrologist."  Objective:  Vital signs in last 24 hours: Vitals:   08/24/17 0659 08/24/17 0704 08/24/17 0730 08/24/17 0800  BP: (!) 89/63  120/74 (!) 109/46  Pulse: 96 100 89 87  Resp: '13 17 14 16  ' Temp:      TempSrc:      SpO2:      Weight:      Height:       Physical Exam  Constitutional: He is oriented to person, place, and time and well-developed, well-nourished, and in no distress.  HENT:  Head: Normocephalic and atraumatic.  Eyes: EOM are normal. Right eye exhibits no discharge. Left eye exhibits no discharge.  Cardiovascular: Exam reveals no gallop and no friction rub.   No murmur heard. Irregularly irregular rhythm  Pulmonary/Chest: Effort normal and breath sounds normal. No respiratory distress. He has no wheezes.  Reduced breath sounds at right base  Abdominal: Soft. He exhibits no distension. There is no tenderness.  Musculoskeletal: He exhibits no edema.  Increased redness and swelling @ LUE AVF site  Neurological: He is alert and oriented to person, place, and time.  Skin: Skin is warm and dry.    Assessment/Plan:  Mr. 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  Cardiac arrest/syncope: Patient presented to the ED after he became unresponsive, pulseless and apneic during his hemodialysis session. CPR was performed and patient became responsive and had ROSC.  He had a Myoview that showed reversibility in mid inferior wall. EF greater than 50%. Cardiac cath on 10/1 was delayed to 10/3 due to elevated INR. 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  Left upper extremity cellulitis / Fluid pocket During admission patient complained of increasing left forearm swelling and pain. There was concern for a clotted fistula. Left upper extremity Doppler was negative. He was started on Vanc and cefepime for suspected cellulitis vs thrombophlebitis. This was de-escalated to cefazolin and patient improved. He completed a 7 day course. 1 day after discontinuation of antibiotic (and after a heart cath and HD that day) patient was found to be tachycardia and bebrile (101.8, axillary) with increased warmth and edema in LUE (10/3). Concern for recurrent Cellulitis vs Thrombophlebitis. Blood cultures pending. LUE Venous duplex showed: No evidence  of DVT or superficial thrombosis and fluid collection with mixed internal echoes noted anterior to AVF. Initial vascular surgery recs from evaluation on 10/7 were to address at the time of the second stage transposition. - Request repeat vascular surgery elavuation - Cefazolin  Chronic Atrial fibrillation Patient had an episode of A. Fib with RVR in hemodialysis 9/26 while inpatient. Patient was started on warfarin amiodarone and metoprolol. Patient is  currently on metoprolol. His evening dose has been held ion multiple occasions due to concern over hypotension. - Warfarin - Metorprolol  Volume overload: ESRD on HD MWFS: Patient has experienced significant difficulties with HD throughout his hospitalization. He continues to have volume overload despite attempts at HD. He also has chronic hypotension which is exacerbated by HD. In the setting of his severe right heart failure and this failure of HD. Increased asymptomatic pleural effusion on CXR (10/4). Will need to work with nephrology on options for patient upon discharge considering his frequent dialysis. - Nephrology following - Midodrine  DM  On 10 units Levemir qhsat home, currently has run out.  - SSI  Right 4th digit pain and swelling Suspected gout flair. Patient states this is similar to previous flairs. Resolved with 5d course of Prednisone.   Elevated ALP Patient has persistent elevated ALP. Abdominal ultrasound in the past has revealed gallbladder wall thickening. He also has persistent elevation in bilirubin. Based on imaging in the past including CT scan and ultrasound of abdomen there is suggestion of liver cirrhosis.  Patient has currently multiple medical issues and once he has improved would consider repeat abdominal ultrasound for further evaluation.   Dispo: Anticipated discharge pending clinical course.  Neva Seat, MD 08/24/2017, 11:09 AM Pager: [phone]

## 2017-08-24 NOTE — Procedures (Signed)
I was present at this dialysis session. I have reviewed the session itself and made appropriate changes.   RFP this AM pending. Hb stable. 2K bath. 1.5L UF.  BP 102/52, Pt sleeping but awakens to voice. LUE AVF +B/T, VVS note reviewed. No LEE. R IJ TDC Qb 400 AP stable.  Cont HD MWFS.  Palliative notes reviews as well.    Filed Weights   08/22/17 1520 08/24/17 0421 08/24/17 0651  Weight: 72.9 kg (160 lb 11.5 oz) 78 kg (172 lb) 74.8 kg (164 lb 14.5 oz)     Recent Labs Lab 08/23/17 1559  NA 130*  K 4.7  CL 94*  CO2 25  GLUCOSE 92  BUN 20  CREATININE 2.79*  CALCIUM 9.0  PHOS 2.6     Recent Labs Lab 08/23/17 0024 08/23/17 1559 08/24/17 0314  WBC 7.1 6.4 5.8  HGB 10.0* 10.6* 10.2*  HCT 31.5* 33.0* 32.0*  MCV 82.5 82.5 82.3  PLT 89* 113* 92*    Scheduled Meds: . allopurinol  150 mg Oral Daily  . atorvastatin  20 mg Oral Daily  . calcium acetate  667 mg Oral TID WC  . darbepoetin (ARANESP) injection - DIALYSIS  100 mcg Intravenous Q Wed-HD  . diclofenac sodium  4 g Topical QID  . feeding supplement (PRO-STAT SUGAR FREE 64)  30 mL Oral BID  . insulin aspart  0-5 Units Subcutaneous QHS  . insulin aspart  0-9 Units Subcutaneous TID WC  . metoprolol succinate  25 mg Oral BID  . midodrine  10 mg Oral TID WC  . pantoprazole  40 mg Oral Daily  . sodium chloride flush  3 mL Intravenous Q12H  . Warfarin - Pharmacist Dosing Inpatient   Does not apply q1800   Continuous Infusions: . sodium chloride 10 mL/hr at 08/19/17 0613  . sodium chloride    . sodium chloride    . sodium chloride    .  ceFAZolin (ANCEF) IV Stopped (08/23/17 2230)  . ferric gluconate (FERRLECIT/NULECIT) IV Stopped (08/21/17 1831)  . sodium chloride     PRN Meds:.sodium chloride, sodium chloride, sodium chloride, acetaminophen, alteplase, heparin, HYDROcodone-acetaminophen, lidocaine (PF), lidocaine-prilocaine, morphine injection, pentafluoroprop-tetrafluoroeth, senna-docusate, sodium chloride, sodium  chloride flush   Pearson Grippe  MD 08/24/2017, 8:25 AM

## 2017-08-24 NOTE — Progress Notes (Signed)
  Date: 08/24/2017  Patient name: Michael [name]  Medical record number: [id number]  Date of birth: 09-21-1961   I have seen and evaluated this patient and I have discussed the plan of care with the house staff. Please see their note for complete details. I concur with their findings with the following additions/corrections:   Seen in dialysis this morning. Seems to be tolerating dialysis slightly better the past few days, reportedly able to get more fluid off as well. After discussion of goals of care, he would like to continue with aggressive measures with the hope to see his daughter graduate in a year. I am not sure if that will be possible, but we will continue with aggressive life-prolonging measures for now.   The skin over his left arm graft appeared more red this morning. We asked vascular surgery to reevaluate, they recommend outpatient follow up. He remains afebrile, and we will try to transition him to oral cephalexin and ensure he is responding well before discharge because of the location overlying his fistula.  Oda Kilts, MD 08/24/2017, 10:55 PM

## 2017-08-24 NOTE — Progress Notes (Signed)
   08/24/17 1000  PT Visit Information  Last PT Received On 08/24/17  Reason Eval/Treat Not Completed Patient at procedure or test/unavailable   Will try later as time and pt allow. Mee Hives, PT MS Acute Rehab Dept. Number: [place] and [place]

## 2017-08-24 NOTE — Progress Notes (Signed)
ANTICOAGULATION CONSULT NOTE  Pharmacy Consult to Warfarin Indication: atrial fibrillation   No Known Allergies  Vital Signs: Temp: 98.1 F (36.7 C) (10/08 1035) Temp Source: Oral (10/08 1035) BP: 94/62 (10/08 1035) Pulse Rate: 104 (10/08 1035)  Labs:  Recent Labs  08/21/17 1442 08/22/17 0500 08/22/17 1300 08/23/17 0024 08/23/17 1559 08/24/17 0314 08/24/17 0823  HGB 9.5* 10.0*  --  10.0* 10.6* 10.2*  --   HCT 30.8* 31.5*  --  31.5* 33.0* 32.0*  --   PLT 191 153  --  89* 113* 92*  --   LABPROT  --  20.7*  --  25.9*  --  32.4*  --   INR  --  1.79  --  2.40  --  3.18  --   HEPARINUNFRC  --  0.15* 0.25* 0.24*  --   --   --   CREATININE 3.01*  --   --   --  2.79*  --  3.26*    Estimated Creatinine Clearance: 21.7 mL/min (A) (by C-G formula based on SCr of 3.26 mg/dL (H)).  Assessment: 56yom with suspected cardiac arrest at HD center. On coumadin pta for afib/bioprosthetic MVR. S/p cath, had resumed heparin and warfarin, heparin discontinued 10/7.  INR supratherapeutic today at 3.18 today (up from 2.4), may continue to trend up since such a large jump today. Pt is s/p vit K 10/1 and 10/2. Pt received several higher doses than home dose.  H/H low but stable, Plt continue to trend down but they have gone up and down on previous admissions as well, so will monitor. Vascular noted "likely mixed hematoma/seroma".  Home warfarin dose: 2.5mg  daily   Goal of Therapy:  INR 2-3  Monitor platelets by anticoagulation protocol: Yes   Plan:  Hold warfarin today Daily INR, CBC, s/s bleeding  Thank you for allowing [place] to participate in this patients care.  Jens Som, PharmD Clinical phone for 08/24/2017 from 7a-3:30p: [phone] If after 3:30p, please call main pharmacy at: [phone] 08/24/2017 1:16 PM

## 2017-08-24 NOTE — Progress Notes (Signed)
   Evaluated left arm and there is likely mixed hematoma/seroma overlying the anastomosis in the antecubitum. The arm is not edematous elsewhere and this in no erythema. Can keep regularly scheduled appointment 10.26 and will eval with duplex that day.   Brandon C. Donzetta Matters, MD [facility] Office: [phone] Pager: [phone]

## 2017-08-24 NOTE — Patient Outreach (Signed)
[facility]) Care Management  08/24/17  Michael [name] 1961-06-29 [id number]  Patient was admitted to hospital on 08/10/2017 following cardiac arrest and remains inpatient today for a LOS greater than 10 days. RNCM to perform case closure as patient remains in acute hospital at this time. Hospital liaison and [facility] SW notified via in basket.  [place] R. Wade Asebedo, RN, BSN, [place] Management Coordinator [phone]

## 2017-08-25 LAB — GLUCOSE, CAPILLARY
GLUCOSE-CAPILLARY: 106 mg/dL — AB (ref 65–99)
GLUCOSE-CAPILLARY: 110 mg/dL — AB (ref 65–99)
GLUCOSE-CAPILLARY: 91 mg/dL (ref 65–99)
Glucose-Capillary: 105 mg/dL — ABNORMAL HIGH (ref 65–99)

## 2017-08-25 LAB — PROTIME-INR
INR: 2.88
Prothrombin Time: 29.9 seconds — ABNORMAL HIGH (ref 11.4–15.2)

## 2017-08-25 MED ORDER — CEPHALEXIN 500 MG PO CAPS
500.0000 mg | ORAL_CAPSULE | Freq: Every day | ORAL | 0 refills | Status: AC
Start: 2017-08-25 — End: 2017-08-29

## 2017-08-25 MED ORDER — WARFARIN SODIUM 2 MG PO TABS
3.0000 mg | ORAL_TABLET | Freq: Once | ORAL | Status: AC
  Administered 2017-08-25: 17:00:00 3 mg via ORAL
  Filled 2017-08-25: qty 1

## 2017-08-25 MED ORDER — CEPHALEXIN 500 MG PO CAPS
500.0000 mg | ORAL_CAPSULE | Freq: Every day | ORAL | Status: DC
  Administered 2017-08-25: 500 mg via ORAL
  Filled 2017-08-25: qty 1

## 2017-08-25 MED FILL — CEPHALEXIN 500 MG CAPSULE: 500 | 4 days supply | Qty: 4 | Fill #0

## 2017-08-25 NOTE — Progress Notes (Signed)
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 Fib, HFpEF, GERD, S/P Mitral Valve replacement and Tricuspid valve repair, recently admitted from 8/9 - 9/10 for Acute on Chronic HF and AKI who presents following an episode of syncope and collapse at dialysis today.   OT comments  Pt progressing in mobility and activity tolerance. Able to stand at sink for grooming and toilet with supervision. Performed seated dressing with set up. Educate in energy conservation strategies with pt verbalizing understanding. Will continue to follow.  Follow Up Recommendations  Home health OT    Equipment Recommendations  None recommended by OT    Recommendations for Other Services      Precautions / Restrictions Precautions Precautions: Fall Restrictions Weight Bearing Restrictions: No       Mobility Bed Mobility               General bed mobility comments: Pt OOB in chair upon arrival  Transfers Overall transfer level: Needs assistance Equipment used: Rolling walker (2 wheeled) Transfers: Sit to/from Stand Sit to Stand: Supervision         General transfer comment: for safety, cues for hand placement    Balance Overall balance assessment: Needs assistance   Sitting balance-Leahy Scale: Good Sitting balance - Comments: no LOB with donning socks     Standing balance-Leahy Scale: Fair Standing balance comment: able to stand statically at sink without support                           ADL either performed or assessed with clinical judgement   ADL Overall ADL's : Needs assistance/impaired     Grooming: Oral care;Standing;Supervision/safety           Upper Body Dressing : Set up;Sitting   Lower Body Dressing: Set up;Sitting/lateral leans Lower Body Dressing Details (indicate cue type  and reason): socks Toilet Transfer: Supervision/safety;Comfort height toilet;RW   Toileting- Clothing Manipulation and Hygiene: Supervision/safety;Sit to/from stand       Functional mobility during ADLs: Supervision/safety;Rolling walker General ADL Comments: Pt educated in energy conservation. Did not provide handout as pt reports he prefers to learn verbally.     Vision       Perception     Praxis      Cognition Arousal/Alertness: Awake/alert Behavior During Therapy: WFL for tasks assessed/performed Overall Cognitive Status: Within Functional Limits for tasks assessed                                          Exercises     Shoulder Instructions       General Comments      Pertinent Vitals/ Pain       Pain Assessment: Faces Faces Pain Scale: Hurts even more Pain Location: feet Pain Descriptors / Indicators: Throbbing Pain Intervention(s): Monitored during session;Repositioned;Premedicated before session  Home Living                                          Prior Functioning/Environment              Frequency  Min 2X/week  Progress Toward Goals  OT Goals(current goals can now be found in the care plan section)  Progress towards OT goals: Progressing toward goals  Acute Rehab OT Goals Patient Stated Goal: get better and back home; noted in Palliative Care note he wants to see his daughter graduate from [facility] next year OT Goal Formulation: With patient Time For Goal Achievement: 09/06/17 Potential to Achieve Goals: Good  Plan Discharge plan remains appropriate    Co-evaluation                 AM-PAC PT "6 Clicks" Daily Activity     Outcome Measure   Help from another person eating meals?: None Help from another person taking care of personal grooming?: A Little Help from another person toileting, which includes using toliet, bedpan, or urinal?: A Little Help from another person bathing (including  washing, rinsing, drying)?: A Little Help from another person to put on and taking off regular upper body clothing?: None Help from another person to put on and taking off regular lower body clothing?: A Little 6 Click Score: 20    End of Session Equipment Utilized During Treatment: Gait belt;Rolling walker  OT Visit Diagnosis: Unsteadiness on feet (R26.81);Muscle weakness (generalized) (M62.81);Pain   Activity Tolerance Patient tolerated treatment well   Patient Left in chair;with call bell/phone within reach   Nurse Communication          Time: 6394-3200 OT Time Calculation (min): 19 min  Charges: OT General Charges $OT Visit: 1 Visit OT Treatments $Self Care/Home Management : 8-22 mins  08/25/2017 Nestor Lewandowsky, OTR/L Pager: [phone] Werner Lean, Haze Boyden 08/25/2017, 10:25 AM

## 2017-08-25 NOTE — Progress Notes (Signed)
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 Fib, HFpEF, GERD, S/P Mitral Valve replacement and Tricuspid valve repair, recently admitted from 8/9 - 9/10 for Acute on Chronic HF and AKI who presents following an episode of syncope and collapse at dialysis today.    PT Comments    Pt making good progress with mobility. Continue to recommend pt return home with HHPT. Primary limitation is pain in bil feet.   Follow Up Recommendations  Home health PT     Equipment Recommendations  None recommended by PT    Recommendations for Other Services       Precautions / Restrictions Precautions Precautions: Fall    Mobility  Bed Mobility Overal bed mobility: Modified Independent             General bed mobility comments: Incr time and effort  Transfers Overall transfer level: Modified independent Equipment used: Rolling walker (2 wheeled) Transfers: Sit to/from Stand Sit to Stand: Modified independent (Device/Increase time)            Ambulation/Gait Ambulation/Gait assistance: Supervision Ambulation Distance (Feet): 125 Feet Assistive device: Rolling walker (2 wheeled) Gait Pattern/deviations: Step-through pattern;Decreased stride length;Trunk flexed;Antalgic Gait velocity: decr Gait velocity interpretation: Below normal speed for age/gender General Gait Details: assist for safety   Stairs            Wheelchair Mobility    Modified Rankin (Stroke Patients Only)       Balance Overall balance assessment: Needs assistance Sitting-balance support: No upper extremity supported;Feet supported Sitting balance-Leahy Scale: Good     Standing balance support: No upper extremity supported Standing balance-Leahy Scale: Fair                              Cognition  Arousal/Alertness: Awake/alert Behavior During Therapy: WFL for tasks assessed/performed Overall Cognitive Status: Within Functional Limits for tasks assessed                                        Exercises      General Comments        Pertinent Vitals/Pain Pain Assessment: Faces Faces Pain Scale: Hurts even more Pain Location: feet Pain Descriptors / Indicators: Grimacing;Guarding Pain Intervention(s): Monitored during session;Repositioned    Home Living                      Prior Function            PT Goals (current goals can now be found in the care plan section) Progress towards PT goals: Progressing toward goals    Frequency    Min 3X/week      PT Plan Current plan remains appropriate    Co-evaluation              AM-PAC PT "6 Clicks" Daily Activity  Outcome Measure  Difficulty turning over in bed (including adjusting bedclothes, sheets and blankets)?: None Difficulty moving from lying on back to sitting on the side of the bed? : A Little Difficulty sitting down on and standing up from a chair with arms (e.g., wheelchair, bedside commode, etc,.)?: None Help needed moving to and from a bed to chair (including  a wheelchair)?: A Little Help needed walking in hospital room?: A Little Help needed climbing 3-5 steps with a railing? : A Little 6 Click Score: 20    End of Session   Activity Tolerance: Patient tolerated treatment well Patient left: with call bell/phone within reach;in bed Nurse Communication: Mobility status PT Visit Diagnosis: Unsteadiness on feet (R26.81);Muscle weakness (generalized) (M62.81)     Time: 0871-9941 PT Time Calculation (min) (ACUTE ONLY): 13 min  Charges:  $Gait Training: 8-22 mins                    G Codes:       [facility] PT [place] 08/25/2017, 4:03 PM

## 2017-08-25 NOTE — Care Management Important Message (Signed)
Important Message  Patient Details  Name: Michael [name] MRN: [id number] Date of Birth: 01/21/1961   Medicare Important Message Given:  Yes    [name] 08/25/2017, 9:34 AM

## 2017-08-25 NOTE — Progress Notes (Signed)
  Date: 08/25/2017  Patient name: Michael [name]  Medical record number: [id number]  Date of birth: [birth year]   I have seen and evaluated this patient and I have discussed the plan of care with the house staff. Please see their note for complete details. I concur with their findings with the following additions/corrections:   Seen together with the team on rounds this morning. He appeared somewhat more lively and interactive this morning. Continues to have allowed rumbling systolic murmur over the left lower sternal border, consistent with his tricuspid regurgitation. Recently, appears to be tolerating his dialysis sessions better, and they are able to push his dry weight slightly lower. Although the swelling over his left AV fistula site appears slightly worse today, the redness overall is better. I appreciate vascular surgery evaluating the fluid collection there, and they recommend outpatient follow-up. We will complete a ten-day course of antibiotics for cellulitis of that area. He is medically ready for discharge, and I appreciate OT seen him this morning. We are still awaiting PT evaluation, as they have been unable to see him in recent days due to him being at dialysis.  Oda Kilts, MD 08/25/2017, 2:56 PM

## 2017-08-25 NOTE — Progress Notes (Signed)
ANTICOAGULATION CONSULT NOTE  Pharmacy Consult to Warfarin Indication: atrial fibrillation   No Known Allergies  Vital Signs: Temp: 98.9 F (37.2 C) (10/09 0500) Temp Source: Oral (10/09 0500) BP: 101/62 (10/09 0500) Pulse Rate: 64 (10/09 0500)  Labs:  Recent Labs  08/22/17 1300  08/23/17 0024 08/23/17 1559 08/24/17 0314 08/24/17 0823 08/25/17 0246  HGB  --   < > 10.0* 10.6* 10.2*  --   --   HCT  --   --  31.5* 33.0* 32.0*  --   --   PLT  --   --  89* 113* 92*  --   --   LABPROT  --   --  25.9*  --  32.4*  --  29.9*  INR  --   --  2.40  --  3.18  --  2.88  HEPARINUNFRC 0.25*  --  0.24*  --   --   --   --   CREATININE  --   --   --  2.79*  --  3.26*  --   < > = values in this interval not displayed.  Estimated Creatinine Clearance: 24 mL/min (A) (by C-G formula based on SCr of 3.26 mg/dL (H)).  Assessment: 56yom with suspected cardiac arrest at HD center. On coumadin pta for afib/bioprosthetic MVR. S/p cath, had resumed heparin and warfarin, heparin discontinued 10/7.  One dose of warfarin was held yesterday for supratherapeutic INR.  Vascular noted "likely mixed hematoma/seroma". Vascular planning to follow-up outpatient.  INR therapeutic: 2.88 INR   Home warfarin dose: 2.5mg  daily   Goal of Therapy:  INR 2-3  Monitor platelets by anticoagulation protocol: Yes   Plan:  Warfarin 3mg  x1 tonight  Daily INR, CBC, s/s bleeding  Thank you for allowing [place] to participate in this patients care.  Georga Bora, PharmD Clinical Pharmacist 08/25/2017 8:33 AM

## 2017-08-25 NOTE — Progress Notes (Signed)
[facility] Progress Note   Subjective:  No new complaints. Ambulated in halls yesterday.  Objective Vitals:   08/24/17 1624 08/24/17 1952 08/24/17 2132 08/25/17 0500  BP: 103/66 118/83  101/62  Pulse: 83 87 93 64  Resp: 12 18 19 18   Temp: 98.2 F (36.8 C) 98.3 F (36.8 C)  98.9 F (37.2 C)  TempSrc: Oral Oral  Oral  SpO2: 99% 100% 96% 94%  Weight:    77.2 kg (170 lb 3.2 oz)  Height:       Physical Exam General: Pleasant, NAD Heart: regularly irregular V7,B9 2/6 systolic M Lungs: Slightly dec in bases otherwise CTAB Abdomen: active BS Extremities: No LE edema. Still trace edema upper thighs, abdomen.  Dialysis Access: RIJ [facility] CDI. Maturing RUA AVF + bruit / thrill   Additional Objective Labs: Basic Metabolic Panel:  Recent Labs Lab 08/21/17 1442 08/23/17 1559 08/24/17 0823  NA 132* 130* 129*  K 4.1 4.7 3.8  CL 98* 94* 93*  CO2 28 25 26   GLUCOSE 99 92 78  BUN 27* 20 21*  CREATININE 3.01* 2.79* 3.26*  CALCIUM 8.3* 9.0 9.0  PHOS 2.9 2.6 3.2   Liver Function Tests:  Recent Labs Lab 08/21/17 1442 08/23/17 1559 08/24/17 0823  ALBUMIN 2.7* 3.0* 2.9*   No results for input(s): LIPASE, AMYLASE in the last 168 hours. CBC:  Recent Labs Lab 08/21/17 1442 08/22/17 0500 08/23/17 0024 08/23/17 1559 08/24/17 0314  WBC 6.7 6.1 7.1 6.4 5.8  HGB 9.5* 10.0* 10.0* 10.6* 10.2*  HCT 30.8* 31.5* 31.5* 33.0* 32.0*  MCV 83.7 83.1 82.5 82.5 82.3  PLT 191 153 89* 113* 92*   Blood Culture    Component Value Date/Time   SDES BLOOD RIGHT HAND 08/19/2017 2050   SPECREQUEST  08/19/2017 2050    BOTTLES DRAWN AEROBIC ONLY Blood Culture adequate volume   CULT NO GROWTH 5 DAYS 08/19/2017 2050   REPTSTATUS 08/24/2017 FINAL 08/19/2017 2050    Cardiac Enzymes: No results for input(s): CKTOTAL, CKMB, CKMBINDEX, TROPONINI in the last 168 hours. CBG:  Recent Labs Lab 08/23/17 2030 08/24/17 1214 08/24/17 1623 08/24/17 2136 08/25/17 0726  GLUCAP  97 82 97 105* 105*   Iron Studies: No results for input(s): IRON, TIBC, TRANSFERRIN, FERRITIN in the last 72 hours. @lablastinr3 @ Studies/Results: No results found. Medications: . sodium chloride 10 mL/hr at 08/19/17 0613  . sodium chloride    .  ceFAZolin (ANCEF) IV Stopped (08/24/17 2138)  . ferric gluconate (FERRLECIT/NULECIT) IV Stopped (08/24/17 1030)  . sodium chloride     . allopurinol  150 mg Oral Daily  . atorvastatin  20 mg Oral Daily  . calcium acetate  667 mg Oral TID WC  . darbepoetin (ARANESP) injection - DIALYSIS  100 mcg Intravenous Q Wed-HD  . diclofenac sodium  4 g Topical QID  . feeding supplement (PRO-STAT SUGAR FREE 64)  30 mL Oral BID  . insulin aspart  0-5 Units Subcutaneous QHS  . insulin aspart  0-9 Units Subcutaneous TID WC  . metoprolol succinate  25 mg Oral BID  . midodrine  10 mg Oral TID WC  . pantoprazole  40 mg Oral Daily  . sodium chloride flush  3 mL Intravenous Q12H  . warfarin  3 mg Oral ONCE-1800  . Warfarin - Pharmacist Dosing Inpatient   Does not apply q1800   HD orders: MWFS [facility] 4 hr 180 NRe 400/800  75 Kg  2.0 K/2.0 Ca  Heparin 4200 units IV q  Tx Venofer 50 mg IV weekly Mircera 200 mcg IV q 2 weeks (last dose 08/05/17   Assessment/Plan: 1. S/P cardiac arrest: Brief CPR 3-5 minutes before ROSC.  2. Chronic R Heart Failure: Monitor volume carefully. Continue HD 4 days a week.  3. AFIB RVR: on coumadin/metoprolol. Rate controlled.  4. S/P MVR/TVR 5. ESRD -MWFS-Has had serial HD for volume removal. Looks much better. Next HD tomorrow on schedule.  6. Anemia - HGB at target. Last ESA 08/22/17. Continue weekly ESA/Follow HGB.  7. Secondary hyperparathyroidism - Continue binders.  8. HTN/volume - New lower EDW at least 73kg, would cont to probe downwards as able. 9. Nutrition - hypoalbuminemia. Prostat. 10. Palliative care consult: currently full code. Plan is for continued HD as long as patient tolerates for now 11. LUE  Cellulitis/Elevated temps/hematoma-seroma at L AVF; VVS following.  Off ABX.    OK for discharge and cont HD as outpt, if here tomorrow: 2K, standing weights, 72.5kg EDW. Hep 4k IBV, TDC, Qb 400.    Pearson Grippe MD [facility]

## 2017-08-25 NOTE — Progress Notes (Signed)
Subjective:  Patient was seen sitting comfortably in his chair this morning. He states he had some mild foot pain, but has no other complaints. He has no questions this morning and states he feels okay.  Objective:  Vital signs in last 24 hours: Vitals:   08/24/17 1952 08/24/17 2132 08/25/17 0500 08/25/17 0907  BP: 118/83  101/62 97/68  Pulse: 87 93 64 80  Resp: 18 19 18    Temp: 98.3 F (36.8 C)  98.9 F (37.2 C)   TempSrc: Oral  Oral   SpO2: 100% 96% 94%   Weight:   170 lb 3.2 oz (77.2 kg)   Height:       Physical Exam  Constitutional: He is oriented to person, place, and time and well-developed, well-nourished, and in no distress.  HENT:  Head: Normocephalic and atraumatic.  Eyes: EOM are normal. Right eye exhibits no discharge. Left eye exhibits no discharge.  Cardiovascular: Exam reveals no gallop and no friction rub.   Murmur heard. Irregularly irregular rhythm  Pulmonary/Chest: Effort normal and breath sounds normal. No respiratory distress. He has no wheezes.  Reduced breath sounds at right base  Abdominal: Soft. He exhibits no distension. There is no tenderness.  Musculoskeletal: He exhibits no edema.  Fluid pocket at AVF site Good pulses Bilat LE's 3-4 sec Cap refill bilat LE's  Neurological: He is alert and oriented to person, place, and time.  Skin: Skin is warm and dry.   Assessment/Plan:  Mr. 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  Cardiac arrest/syncope: Patient presented to the ED after he became unresponsive, pulseless and apneic during his hemodialysis session. CPR was performed and patient became responsive and had ROSC.  He had a Myoview that showed reversibility in mid inferior wall. EF greater than  50%. Cardiac cath on 10/1 was delayed to 10/3 due to elevated INR. 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 Cardiology recommended no intervention and stated the patient can follow up with Dr. Harrington Challenger following discharge.  Left upper extremity cellulitis / Fluid pocket During admission patient complained of increasing left forearm swelling and pain. There was concern for a clotted fistula. Left upper extremity Doppler was negative. He was started on Vanc and cefepime for suspected cellulitis vs thrombophlebitis. This was de-escalated to cefazolin and patient improved. He completed a 7 day course. 1 day after discontinuation of antibiotic (and after a heart cath and HD that day) patient was found to be tachycardia and bebrile (101.8, axillary) with increased warmth and edema in LUE (10/3). Concern for recurrent Cellulitis vs Thrombophlebitis. Blood cultures pending. LUE Venous duplex showed: No evidence of DVT or superficial thrombosis and fluid collection with mixed internal echoes noted anterior to AVF. Initial vascular surgery recs are to address this at the time of the second stage transposition. - Keflex to complete a total of 10 day course  Chronic Atrial fibrillation Patient had an episode of A. Fib with RVR in hemodialysis 9/26 while inpatient. Patient was started on warfarin amiodarone and metoprolol. Patient is currently on metoprolol. His evening dose has been held ion multiple occasions due to concern over hypotension. - Warfarin -  Metoprolol   Volume overload: ESRD on HD MWFS: Patient has experienced significant difficulties with HD throughout his hospitalization. He continues to have volume overload despite attempts at HD. He also has chronic hypotension which is exacerbated by HD. In the setting of his severe right heart failure and this failure of HD. Increased asymptomatic pleural  effusion on CXR (10/4). Dialysis has shown increased effectiveness recently. To be discharged on MWFS schedule. - Nephrology following - Midodrine  DM  On 10 units Levemir qhsat home, currently has run out.  - SSI  Right 4th digit pain and swelling Suspected gout flair. Patient states this is similar to previous flairs. Resolved with 5d course of Prednisone.   Elevated ALP Patient has persistent elevated ALP. Abdominal ultrasound in the past has revealed gallbladder wall thickening. He also has persistent elevation in bilirubin. Based on imaging in the past including CT scan and ultrasound of abdomen there is suggestion of liver cirrhosis.  Patient has currently multiple medical issues and once he has improved would consider repeat abdominal ultrasound for further evaluation.   Dispo: Anticipated discharge pending clinical course.  Neva Seat, MD 08/25/2017, 10:50 AM Pager: [phone]

## 2017-08-26 LAB — RENAL FUNCTION PANEL
ANION GAP: 11 (ref 5–15)
Albumin: 2.9 g/dL — ABNORMAL LOW (ref 3.5–5.0)
BUN: 8 mg/dL (ref 6–20)
CALCIUM: 8.2 mg/dL — AB (ref 8.9–10.3)
CO2: 26 mmol/L (ref 22–32)
Chloride: 93 mmol/L — ABNORMAL LOW (ref 101–111)
Creatinine, Ser: 1.99 mg/dL — ABNORMAL HIGH (ref 0.61–1.24)
GFR calc non Af Amer: 36 mL/min — ABNORMAL LOW (ref >60)
GFR, EST AFRICAN AMERICAN: 41 mL/min — AB (ref >60)
Glucose, Bld: 101 mg/dL — ABNORMAL HIGH (ref 65–99)
PHOSPHORUS: 1.9 mg/dL — AB (ref 2.5–4.6)
POTASSIUM: 3.3 mmol/L — AB (ref 3.5–5.1)
SODIUM: 130 mmol/L — AB (ref 135–145)

## 2017-08-26 LAB — CBC
HCT: 31.7 % — ABNORMAL LOW (ref 39.0–52.0)
Hemoglobin: 10.1 g/dL — ABNORMAL LOW (ref 13.0–17.0)
MCH: 26 pg (ref 26.0–34.0)
MCHC: 31.9 g/dL (ref 30.0–36.0)
MCV: 81.7 fL (ref 78.0–100.0)
Platelets: 113 10*3/uL — ABNORMAL LOW (ref 150–400)
RBC: 3.88 MIL/uL — ABNORMAL LOW (ref 4.22–5.81)
RDW: 18.4 % — ABNORMAL HIGH (ref 11.5–15.5)
WBC: 8.1 10*3/uL (ref 4.0–10.5)

## 2017-08-26 LAB — GLUCOSE, CAPILLARY
GLUCOSE-CAPILLARY: 150 mg/dL — AB (ref 65–99)
Glucose-Capillary: 75 mg/dL (ref 65–99)

## 2017-08-26 LAB — PROTIME-INR
INR: 2.8
Prothrombin Time: 29.3 seconds — ABNORMAL HIGH (ref 11.4–15.2)

## 2017-08-26 MED ORDER — MIDODRINE HCL 5 MG PO TABS
ORAL_TABLET | ORAL | Status: AC
  Administered 2017-08-26: 10 mg via ORAL
  Filled 2017-08-26: qty 2

## 2017-08-26 MED ORDER — DARBEPOETIN ALFA 100 MCG/0.5ML IJ SOSY
PREFILLED_SYRINGE | INTRAMUSCULAR | Status: AC
  Administered 2017-08-26: 100 ug via INTRAVENOUS
  Filled 2017-08-26: qty 0.5

## 2017-08-26 MED ORDER — WARFARIN SODIUM 2.5 MG PO TABS
2.5000 mg | ORAL_TABLET | Freq: Once | ORAL | Status: AC
  Administered 2017-08-26: 2.5 mg via ORAL
  Filled 2017-08-26: qty 1

## 2017-08-26 MED ORDER — HYDROCODONE-ACETAMINOPHEN 5-325 MG PO TABS
ORAL_TABLET | ORAL | Status: AC
  Administered 2017-08-26: 1 via ORAL
  Filled 2017-08-26: qty 1

## 2017-08-26 NOTE — Progress Notes (Signed)
Subjective:  Patient was seen sitting comfortably in his chair this morning. He denies pain but does mention that he has had chronic numbness in his left hand and some in his foot as well that comes and goes. He has no questions this morning and states he feels okay and is ready to go home.   Objective:  Vital signs in last 24 hours: Vitals:   08/25/17 1404 08/25/17 1700 08/25/17 2037 08/26/17 0452  BP: 103/72 104/72 104/76 (!) 96/57  Pulse: 67  89 80  Resp: 15  15 15   Temp: (!) 97.4 F (36.3 C)  98.4 F (36.9 C) 98.1 F (36.7 C)  TempSrc: Oral  Oral Oral  SpO2: 99%  98% 97%  Weight:    173 lb 3.2 oz (78.6 kg)  Height:       Physical Exam  Constitutional: He is oriented to person, place, and time and well-developed, well-nourished, and in no distress.  HENT:  Head: Normocephalic and atraumatic.  Eyes: EOM are normal. Right eye exhibits no discharge. Left eye exhibits no discharge.  Cardiovascular: Exam reveals no gallop and no friction rub.   Murmur heard. Irregularly irregular rhythm  Pulmonary/Chest: Effort normal and breath sounds normal. No respiratory distress. He has no wheezes.  Reduced breath sounds at right base  Abdominal: Soft. He exhibits no distension. There is no tenderness.  Musculoskeletal: He exhibits no edema or deformity.  Neurological: He is alert and oriented to person, place, and time.  Skin: Skin is warm and dry.   Assessment/Plan:  Mr. 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  Cardiac arrest/syncope: Patient presented to the ED after he became unresponsive, pulseless and apneic during his hemodialysis session. CPR was performed and patient became responsive and had ROSC.  He had a Myoview that showed  reversibility in mid inferior wall. EF greater than 50%. Cardiac cath on 10/1 was delayed to 10/3 due to elevated INR. 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 Cardiology recommended no intervention and stated the patient can follow up with Dr. Harrington Challenger following discharge.  Left upper extremity cellulitis / Fluid pocket During admission patient complained of increasing left forearm swelling and pain. There was concern for a clotted fistula. Left upper extremity Doppler was negative. He was started on Vanc and cefepime for suspected cellulitis vs thrombophlebitis. This was de-escalated to cefazolin and patient improved. He completed a 7 day course. 1 day after discontinuation of antibiotic (and after a heart cath and HD that day) patient was found to be tachycardia and bebrile (101.8, axillary) with increased warmth and edema in LUE (10/3). Concern for recurrent Cellulitis vs Thrombophlebitis. Blood cultures pending. LUE Venous duplex showed: No evidence of DVT or superficial thrombosis and fluid collection with mixed internal echoes noted anterior to AVF. Initial vascular surgery recs are to address this at the time of the second stage transposition. - Keflex to complete a total of 10 day course  Chronic Atrial fibrillation Patient had an episode of A. Fib with RVR in hemodialysis 9/26 while inpatient. Patient was started on warfarin amiodarone and metoprolol. Patient is currently on metoprolol. His evening dose has  been held ion multiple occasions due to concern over hypotension. - Warfarin - Metoprolol   Volume overload: ESRD on HD MWFS: Patient has experienced significant difficulties with HD throughout his hospitalization. He continues to have volume overload despite attempts at HD. He also has chronic hypotension which is exacerbated by HD. In the setting of his severe right heart failure  and this failure of HD. Increased asymptomatic pleural effusion on CXR (10/4). Dialysis has shown increased effectiveness recently. To be discharged on MWFS schedule. - Nephrology following - Midodrine  DM  On 10 units Levemir qhsat home, currently has run out.  - SSI  Right 4th digit pain and swelling Suspected gout flair. Patient states this is similar to previous flairs. Resolved with 5d course of Prednisone.   Elevated ALP Patient has persistent elevated ALP. Abdominal ultrasound in the past has revealed gallbladder wall thickening. He also has persistent elevation in bilirubin. Based on imaging in the past including CT scan and ultrasound of abdomen there is suggestion of liver cirrhosis.  Will recommend out patient abdominal ultrasound for further evaluation be considered.   Dispo: Anticipated discharge 0-1 Day(s).  Neva Seat, MD 08/26/2017, 6:43 AM Pager: [phone]

## 2017-08-26 NOTE — Progress Notes (Signed)
Discharge instructions reviewed with patient and daughter- both verbalized understanding. Belongings given to patient.

## 2017-08-26 NOTE — Procedures (Signed)
I was present at this dialysis session. I have reviewed the session itself and made appropriate changes.   Now is 5kg above last HD post weight, was standing.  Intake documented is scant . [place] for 3L, cont to educate on IDWG.  2K RFP pending. Hb at goal. DC today post HD, cont MWFS at [facility] using [facility] for now.  Will dc on PO ABX.    Filed Weights   08/24/17 1035 08/25/17 0500 08/26/17 0452  Weight: 73.2 kg (161 lb 6 oz) 77.2 kg (170 lb 3.2 oz) 78.6 kg (173 lb 3.2 oz)     Recent Labs Lab 08/24/17 0823  NA 129*  K 3.8  CL 93*  CO2 26  GLUCOSE 78  BUN 21*  CREATININE 3.26*  CALCIUM 9.0  PHOS 3.2     Recent Labs Lab 08/23/17 1559 08/24/17 0314 08/26/17 0426  WBC 6.4 5.8 8.1  HGB 10.6* 10.2* 10.1*  HCT 33.0* 32.0* 31.7*  MCV 82.5 82.3 81.7  PLT 113* 92* 113*    Scheduled Meds: . allopurinol  150 mg Oral Daily  . atorvastatin  20 mg Oral Daily  . calcium acetate  667 mg Oral TID WC  . cephALEXin  500 mg Oral Q2200  . darbepoetin (ARANESP) injection - DIALYSIS  100 mcg Intravenous Q Wed-HD  . diclofenac sodium  4 g Topical QID  . feeding supplement (PRO-STAT SUGAR FREE 64)  30 mL Oral BID  . insulin aspart  0-5 Units Subcutaneous QHS  . insulin aspart  0-9 Units Subcutaneous TID WC  . metoprolol succinate  25 mg Oral BID  . midodrine  10 mg Oral TID WC  . pantoprazole  40 mg Oral Daily  . sodium chloride flush  3 mL Intravenous Q12H  . Warfarin - Pharmacist Dosing Inpatient   Does not apply q1800   Continuous Infusions: . sodium chloride 10 mL/hr at 08/19/17 0613  . sodium chloride    . ferric gluconate (FERRLECIT/NULECIT) IV Stopped (08/24/17 1030)  . sodium chloride     PRN Meds:.sodium chloride, acetaminophen, HYDROcodone-acetaminophen, morphine injection, senna-docusate, sodium chloride, sodium chloride flush   Pearson Grippe  MD 08/26/2017, 8:17 AM

## 2017-08-26 NOTE — Consult Note (Addendum)
   [facility] CM Inpatient Consult   08/26/2017  Michael [name] Jul 04, 1961 [id number]     [facility] Care Management follow up.   Spoke with Michael [name] at bedside to discuss [place] Management ongoing follow up.  During bedside conversation, Michael [name] indicates that he needs [name] to be contacted for his transportation to HD on Friday.  Writer contacted [name] on patient's behalf. Come to find out, Michael [name] eligibility for [name] had expired. Fortunately, Kennyth Lose at [facility] was able to extend [name] eligibility until October 30th. Michael [name] must come in person to [place] office before the end of the month to complete application process. Made patient aware of this.  Contacted HD center, spoke with Ivin Booty, [place], to find out the pick up and drop off times for HD.   Called [name] back, spoke with Rasheeda, to schedule [name] pickup for Friday, Saturday, Monday. Pick up from home between 1055 -1125 for 1155 HD appointment and pick from HD center between 445pm- 515pm to take patient home. Unable to make [name] appointment beyond 7 days. Made [name] aware that HD is on Mondays, Wednesday, Fridays, Saturdays.  Re-referred to [place] team.  Made patient aware of above.  Made inpatient RNCM aware.   [name], MSN-Ed, RN,BSN [facility] Liaison [phone]

## 2017-08-26 NOTE — Progress Notes (Signed)
ANTICOAGULATION CONSULT NOTE  Pharmacy Consult to Warfarin Indication: atrial fibrillation   No Known Allergies  Vital Signs: Temp: 98.2 F (36.8 C) (10/10 1058) Temp Source: Oral (10/10 1058) BP: 105/61 (10/10 1058) Pulse Rate: 91 (10/10 1058)  Labs:  Recent Labs  08/23/17 1559 08/24/17 0314 08/24/17 0823 08/25/17 0246 08/26/17 0426  HGB 10.6* 10.2*  --   --  10.1*  HCT 33.0* 32.0*  --   --  31.7*  PLT 113* 92*  --   --  113*  LABPROT  --  32.4*  --  29.9* 29.3*  INR  --  3.18  --  2.88 2.80  CREATININE 2.79*  --  3.26*  --   --     Estimated Creatinine Clearance: 23.7 mL/min (A) (by C-G formula based on SCr of 3.26 mg/dL (H)).  Assessment: 56yom with suspected cardiac arrest at HD center. On coumadin pta for afib/bioprosthetic MVR. S/p cath on 08/19/17,  had resumed heparin and warfarin, heparin discontinued 10/7.  One dose of warfarin was held on 10/8 for supratherapeutic INR.  Vascular noted "likely mixed hematoma/seroma"- Vascular planning to follow-up outpatient.  INR therapeutic: 2.80 today 08/26/17   Home warfarin dose: 2.5mg  daily   Goal of Therapy:  INR 2-3  Monitor platelets by anticoagulation protocol: Yes   Plan:  Likely to be discharge today after HD (HD completed today) Recommend to resumed PTA warfarin dose 2.5mg  daily with continued INR check appointment at Anti-coag clinic (cardiology [facility] as done PTA.  Daily INR, CBC, s/s bleeding  Thank you for allowing [place] to participate in this patients care. Nicole Cella, [place] Clinical Pharmacist Pager: [phone]-330p [phone] 330p-1030p phone [phone] or [phone] Main pharmacy [phone] 08/26/2017 12:56 PM

## 2017-08-26 NOTE — Progress Notes (Signed)
  Date: 08/26/2017  Patient name: Michael [name]  Medical record number: [id number]  Date of birth: Oct 02, 1961   I have seen and evaluated this patient and I have discussed the plan of care with the house staff. Please see their note for complete details. I concur with their findings with the following additions/corrections:   Seen together on rounds this morning while at dialysis. He feels ready to go home, although he does appear slightly more fatigued today than usual. He has been evaluated by PT and OT, both of whom recommend ongoing home therapy. The erythema overlying his AV fistula has resolved. We will complete a total 10 day course of antibiotics, and transition him to cephalexin for discharge. Hopefully, he will continue with his current improved response to dialysis. I do have some concern that he may not be able to function well at home, but he assured me that his daughter and friend will be checking on him and helping him.  Other than the addition of the course of cephalexin, there are no other medication changes for him at this time.  Oda Kilts, MD 08/26/2017, 2:05 PM

## 2017-08-26 NOTE — Progress Notes (Addendum)
PT Cancellation Note  Patient Details Name: Michael [name] MRN: [id number] DOB: [birth year]   Cancelled Treatment:    Reason Eval/Treat Not Completed: Patient at procedure or test/unavailable pt off floor at HD. Will follow.  Attempted to see in PM after HD but pt declined stating he was going home today and not feeling well.    Marguarite Arbour A Devika Dragovich 08/26/2017, 8:14 AM Wray Kearns, [name], DPT [phone]

## 2017-08-27 DIAGNOSIS — I0981 Rheumatic heart failure: Secondary | ICD-10-CM | POA: Diagnosis not present

## 2017-08-27 DIAGNOSIS — I50810 Right heart failure, unspecified: Secondary | ICD-10-CM | POA: Diagnosis not present

## 2017-08-27 MED FILL — FOLIC ACID 1 MG TABLET: 1 | 30 days supply | Qty: 30 | Fill #2

## 2017-08-27 MED FILL — COLCRYS 0.6 MG TABLET: 0.6 | 30 days supply | Qty: 30 | Fill #1

## 2017-08-27 MED FILL — OMEPRAZOLE DR 20 MG CAPSULE: 20 | 30 days supply | Qty: 30 | Fill #2

## 2017-08-27 MED FILL — ATORVASTATIN 20 MG TABLET: 20 | 30 days supply | Qty: 30 | Fill #1

## 2017-08-27 MED FILL — ALLOPURINOL 300 MG TABS: 300 | 30 days supply | Qty: 15 | Fill #1

## 2017-08-28 ENCOUNTER — Encounter (HOSPITAL_COMMUNITY): Payer: Medicare Other

## 2017-08-28 ENCOUNTER — Other Ambulatory Visit: Payer: Self-pay

## 2017-08-28 ENCOUNTER — Encounter: Payer: Medicare Other | Admitting: Vascular Surgery

## 2017-08-28 DIAGNOSIS — E876 Hypokalemia: Secondary | ICD-10-CM | POA: Diagnosis not present

## 2017-08-28 DIAGNOSIS — N2581 Secondary hyperparathyroidism of renal origin: Secondary | ICD-10-CM | POA: Diagnosis not present

## 2017-08-28 DIAGNOSIS — D631 Anemia in chronic kidney disease: Secondary | ICD-10-CM | POA: Diagnosis not present

## 2017-08-28 DIAGNOSIS — N186 End stage renal disease: Secondary | ICD-10-CM | POA: Diagnosis not present

## 2017-08-28 DIAGNOSIS — I5022 Chronic systolic (congestive) heart failure: Secondary | ICD-10-CM | POA: Diagnosis not present

## 2017-08-28 DIAGNOSIS — Z992 Dependence on renal dialysis: Secondary | ICD-10-CM | POA: Diagnosis not present

## 2017-08-28 DIAGNOSIS — E119 Type 2 diabetes mellitus without complications: Secondary | ICD-10-CM | POA: Diagnosis not present

## 2017-08-28 DIAGNOSIS — D509 Iron deficiency anemia, unspecified: Secondary | ICD-10-CM | POA: Diagnosis not present

## 2017-08-28 DIAGNOSIS — I132 Hypertensive heart and chronic kidney disease with heart failure and with stage 5 chronic kidney disease, or end stage renal disease: Secondary | ICD-10-CM | POA: Diagnosis not present

## 2017-08-28 DIAGNOSIS — I1310 Hypertensive heart and chronic kidney disease without heart failure, with stage 1 through stage 4 chronic kidney disease, or unspecified chronic kidney disease: Secondary | ICD-10-CM | POA: Diagnosis not present

## 2017-08-28 DIAGNOSIS — I50810 Right heart failure, unspecified: Secondary | ICD-10-CM | POA: Diagnosis not present

## 2017-08-28 DIAGNOSIS — I509 Heart failure, unspecified: Secondary | ICD-10-CM | POA: Diagnosis not present

## 2017-08-28 NOTE — Telephone Encounter (Signed)
This encounter was created in error - please disregard.

## 2017-08-28 NOTE — Patient Outreach (Signed)
[facility]) Care Management  08/28/17  Michael [name] 1961-09-01 [id number]  RNCM received referral on 08/26/2017 from [place] for transition of care follow up.   Successful outreach completed with patient. Patient identification verified.  Patient call was kept short as patient was currently at dialysis.   Plan: Home visit scheduled for next week.  [place] R. Maxx Calaway, RN, BSN, [place] Management Coordinator [phone]

## 2017-08-29 DIAGNOSIS — Z992 Dependence on renal dialysis: Secondary | ICD-10-CM | POA: Diagnosis not present

## 2017-08-29 DIAGNOSIS — D509 Iron deficiency anemia, unspecified: Secondary | ICD-10-CM | POA: Diagnosis not present

## 2017-08-29 DIAGNOSIS — E119 Type 2 diabetes mellitus without complications: Secondary | ICD-10-CM | POA: Diagnosis not present

## 2017-08-29 DIAGNOSIS — N2581 Secondary hyperparathyroidism of renal origin: Secondary | ICD-10-CM | POA: Diagnosis not present

## 2017-08-29 DIAGNOSIS — N186 End stage renal disease: Secondary | ICD-10-CM | POA: Diagnosis not present

## 2017-08-29 DIAGNOSIS — E876 Hypokalemia: Secondary | ICD-10-CM | POA: Diagnosis not present

## 2017-08-31 ENCOUNTER — Other Ambulatory Visit: Payer: Self-pay | Admitting: Pharmacist

## 2017-08-31 DIAGNOSIS — E876 Hypokalemia: Secondary | ICD-10-CM | POA: Diagnosis not present

## 2017-08-31 DIAGNOSIS — I482 Chronic atrial fibrillation: Secondary | ICD-10-CM | POA: Diagnosis not present

## 2017-08-31 DIAGNOSIS — Z794 Long term (current) use of insulin: Principal | ICD-10-CM

## 2017-08-31 DIAGNOSIS — D509 Iron deficiency anemia, unspecified: Secondary | ICD-10-CM | POA: Diagnosis not present

## 2017-08-31 DIAGNOSIS — Z992 Dependence on renal dialysis: Secondary | ICD-10-CM | POA: Diagnosis not present

## 2017-08-31 DIAGNOSIS — N186 End stage renal disease: Secondary | ICD-10-CM | POA: Diagnosis not present

## 2017-08-31 DIAGNOSIS — E1169 Type 2 diabetes mellitus with other specified complication: Secondary | ICD-10-CM

## 2017-08-31 DIAGNOSIS — E119 Type 2 diabetes mellitus without complications: Secondary | ICD-10-CM | POA: Diagnosis not present

## 2017-08-31 DIAGNOSIS — N2581 Secondary hyperparathyroidism of renal origin: Secondary | ICD-10-CM | POA: Diagnosis not present

## 2017-08-31 MED ORDER — ACCU-CHEK AVIVA PLUS W/DEVICE KIT: PACK | 0 refills | Status: DC

## 2017-09-01 ENCOUNTER — Encounter: Payer: Self-pay | Admitting: Family Medicine

## 2017-09-01 ENCOUNTER — Telehealth: Payer: Self-pay | Admitting: Physician Assistant

## 2017-09-01 ENCOUNTER — Telehealth: Payer: Self-pay | Admitting: *Deleted

## 2017-09-01 ENCOUNTER — Ambulatory Visit (HOSPITAL_BASED_OUTPATIENT_CLINIC_OR_DEPARTMENT_OTHER): Payer: Medicare Other | Admitting: Family Medicine

## 2017-09-01 ENCOUNTER — Telehealth: Payer: Self-pay

## 2017-09-01 VITALS — BP 102/65 | HR 72 | Temp 97.5°F | Resp 18 | Ht 65.0 in | Wt 168.4 lb

## 2017-09-01 DIAGNOSIS — I469 Cardiac arrest, cause unspecified: Secondary | ICD-10-CM

## 2017-09-01 DIAGNOSIS — L03114 Cellulitis of left upper limb: Secondary | ICD-10-CM | POA: Diagnosis not present

## 2017-09-01 DIAGNOSIS — Z952 Presence of prosthetic heart valve: Secondary | ICD-10-CM

## 2017-09-01 DIAGNOSIS — Z7901 Long term (current) use of anticoagulants: Secondary | ICD-10-CM | POA: Insufficient documentation

## 2017-09-01 DIAGNOSIS — N186 End stage renal disease: Secondary | ICD-10-CM

## 2017-09-01 DIAGNOSIS — E119 Type 2 diabetes mellitus without complications: Secondary | ICD-10-CM

## 2017-09-01 DIAGNOSIS — N2581 Secondary hyperparathyroidism of renal origin: Secondary | ICD-10-CM | POA: Diagnosis not present

## 2017-09-01 DIAGNOSIS — G4733 Obstructive sleep apnea (adult) (pediatric): Secondary | ICD-10-CM

## 2017-09-01 DIAGNOSIS — E8770 Fluid overload, unspecified: Secondary | ICD-10-CM | POA: Diagnosis not present

## 2017-09-01 DIAGNOSIS — M546 Pain in thoracic spine: Secondary | ICD-10-CM | POA: Diagnosis not present

## 2017-09-01 DIAGNOSIS — Z79899 Other long term (current) drug therapy: Secondary | ICD-10-CM

## 2017-09-01 DIAGNOSIS — E1122 Type 2 diabetes mellitus with diabetic chronic kidney disease: Secondary | ICD-10-CM

## 2017-09-01 DIAGNOSIS — Z794 Long term (current) use of insulin: Secondary | ICD-10-CM

## 2017-09-01 DIAGNOSIS — M62838 Other muscle spasm: Secondary | ICD-10-CM

## 2017-09-01 DIAGNOSIS — Z992 Dependence on renal dialysis: Secondary | ICD-10-CM

## 2017-09-01 DIAGNOSIS — I5042 Chronic combined systolic (congestive) and diastolic (congestive) heart failure: Secondary | ICD-10-CM | POA: Diagnosis not present

## 2017-09-01 DIAGNOSIS — IMO0001 Reserved for inherently not codable concepts without codable children: Secondary | ICD-10-CM

## 2017-09-01 DIAGNOSIS — Z955 Presence of coronary angioplasty implant and graft: Secondary | ICD-10-CM | POA: Insufficient documentation

## 2017-09-01 DIAGNOSIS — I482 Chronic atrial fibrillation: Secondary | ICD-10-CM

## 2017-09-01 DIAGNOSIS — I0981 Rheumatic heart failure: Secondary | ICD-10-CM | POA: Diagnosis not present

## 2017-09-01 DIAGNOSIS — J9611 Chronic respiratory failure with hypoxia: Secondary | ICD-10-CM | POA: Diagnosis not present

## 2017-09-01 DIAGNOSIS — I50810 Right heart failure, unspecified: Secondary | ICD-10-CM | POA: Diagnosis not present

## 2017-09-01 DIAGNOSIS — I4821 Permanent atrial fibrillation: Secondary | ICD-10-CM

## 2017-09-01 DIAGNOSIS — I132 Hypertensive heart and chronic kidney disease with heart failure and with stage 5 chronic kidney disease, or end stage renal disease: Secondary | ICD-10-CM | POA: Insufficient documentation

## 2017-09-01 DIAGNOSIS — R0602 Shortness of breath: Secondary | ICD-10-CM | POA: Diagnosis not present

## 2017-09-01 DIAGNOSIS — R079 Chest pain, unspecified: Secondary | ICD-10-CM | POA: Diagnosis not present

## 2017-09-01 LAB — PROTIME-INR: INR: 8.9 — AB (ref 0.9–1.1)

## 2017-09-01 LAB — GLUCOSE, POCT (MANUAL RESULT ENTRY): POC Glucose: 70 mg/dl (ref 70–99)

## 2017-09-01 LAB — POCT GLYCOSYLATED HEMOGLOBIN (HGB A1C): HEMOGLOBIN A1C: 5.2

## 2017-09-01 NOTE — Patient Instructions (Signed)
Muscle Cramps and Spasms Muscle cramps and spasms occur when a muscle or muscles tighten and you have no control over this tightening (involuntary muscle contraction). They are a common problem and can develop in any muscle. The most common place is in the calf muscles of the leg. Muscle cramps and muscle spasms are both involuntary muscle contractions, but there are some differences between the two:  Muscle cramps are painful. They come and go and may last a few seconds to 15 minutes. Muscle cramps are often more forceful and last longer than muscle spasms.  Muscle spasms may or may not be painful. They may also last just a few seconds or much longer.  Certain medical conditions, such as diabetes or Parkinson disease, can make it more likely to develop cramps or spasms. However, cramps or spasms are usually not caused by a serious underlying problem. Common causes include:  Overexertion.  Overuse from repetitive motions, or doing the same thing over and over.  Remaining in a certain position for a long period of time.  Improper preparation, form, or technique while playing a sport or doing an activity.  Dehydration.  Injury.  Side effects of some medicines.  Abnormally low levels of the salts and ions in your blood (electrolytes), especially potassium and calcium. This could happen if you are taking water pills (diuretics) or if you are pregnant.  In many cases, the cause of muscle cramps or spasms is unknown. Follow these instructions at home:  Stay well hydrated. Drink enough fluid to keep your urine clear or pale yellow.  Try massaging, stretching, and relaxing the affected muscle.  If directed, apply heat to tight or tense muscles as often as told by your health care provider. Use the heat source that your health care provider recommends, such as a moist heat pack or a heating pad. ? Place a towel between your skin and the heat source. ? Leave the heat on for 20-30  minutes. ? Remove the heat if your skin turns bright red. This is especially important if you are unable to feel pain, heat, or cold. You may have a greater risk of getting burned.  If directed, put ice on the affected area. This may help if you are sore or have pain after a cramp or spasm. ? Put ice in a plastic bag. ? Place a towel between your skin and the bag. ? Leavethe ice on for 20 minutes, 2-3 times a day.  Take over-the-counter and prescription medicines only as told by your health care provider.  Pay attention to any changes in your symptoms. Contact a health care provider if:  Your cramps or spasms get more severe or happen more often.  Your cramps or spasms do not improve over time. This information is not intended to replace advice given to you by your health care provider. Make sure you discuss any questions you have with your health care provider. Document Released: 04/25/2002 Document Revised: 12/05/2015 Document Reviewed: 08/07/2015 Elsevier Interactive Patient Education  2018 [employer]vier Inc.  

## 2017-09-01 NOTE — Progress Notes (Signed)
Patient is here for back pain.

## 2017-09-01 NOTE — Telephone Encounter (Signed)
Received call from [facility] with critical INR of 8.92 on pt that was drawn on yesterday during his hemodialysis session. Per Levada Dy pt was recently discharged from the hospital. Reviewed epic and pt was admitted on 08/10/17 to 08/26/17. INR at discharge was 2.8. Telephoned pt and he states he has been taking 2.5 mg everyday. He denies any bleeding or bruising. He states he take his coumadin in the evening, thus I instructed him to not take any Coumadin tonight or tomorrow and that we will check another INR on Thursday when he is due to come in for PA appt. During this conversation, Gita Kudo [facility] nurse gets on the line as she is doing a home visit at the moment, she states she does not have a coag machine with her. I did also tell her to make sure pt does not take Coumadin today or tomorrow, she states she would as she is currently filling his pill box. She was aware of the appt here at the office on Thursday to see the PA and wants [place] to fax INR orders after visit to her attention @ [phone]. She states she has not noticed any unusual bleeding or bruising on the patient.

## 2017-09-01 NOTE — Progress Notes (Signed)
Subjective:  Patient ID: Michael [name], male    DOB: 10/25/1961  Age: 56 y.o. MRN: [id number]  CC: cellulitis  HPI Michael [name] is a 56 year old male with a history of Type 2DM (A1c 5.2), hypertension, hyperlipidemia, mitral valve replacement with porcine valve, tricuspid valve repair, atrial fibrillation (on anticoagulation with Coumadin),Chronic systolic heartfailure  (EF 50-55% from 07/2017 ), Cirrhosis, ESRD here for follow-up from hospitalization from 08/10/17 through 08/26/17.  He had presented with loss of consciousness during dialysis and was apneic and pulseless on presentation. CPR performed due to cardiac arrest with subsequent regain of consciousness. He was seen by cardiology and underwent cardiac cath which revealed RPDA lesion, 100% stenosed with collateral filling of the distal vessel (not a PCI option and likely not acute), findings in keeping with moderate pulmonary hypertension with mildly elevated left ventricular pressures. Myoview revealed area of reversibility involving mid inferior wall suggesting inducible ischemia. 2-D echo revealed EF of 50-55%, D shaped interventricular septum suggesting right ventricular pressure/volume overload, status post tricuspid valve repair but with severe tricuspid regurg.  During his hospital course he also had left upper extremity cellulitis.Left upper extremity Doppler was negative for DVT but revealed fluid collection with Internal echoes noted anterior to AV fistula and he was placed on antibiotics for treatment of cellulitis versus thrombophlebitis. Subsequently discharged on Keflex Vascular surgery to follow on fluid pocket at second stage transposition  He was noticed to have volume overload despite dialysis.  Today he presents complaining of pain on the left side of his neck which he has had for the last 24 hours and denies history of trauma; states his 'Tylenol 3 is not working' He denies chest pains or shortness of  breath. His appointment with vascular, Dr. Donzetta Matters comes up on 09/11/17.  Past Medical History:  Diagnosis Date  . Anemia of chronic disease   . Asthma    said he does not know  . Chronic kidney disease (CKD), stage IV (severe) ([place])   . Chronic systolic CHF (congestive heart failure) (HCC)    a. prior EF normal; b. Echo 10/16: Mild LVH, EF 30-35%, mitral valve bioprosthesis present without evidence of stenosis or regurgitation, severe LAE, mild RVE with mild to moderately reduced RVSF, moderate RAE  . Coronary artery disease   . Diabetes mellitus type 2 in obese ([place])   . Diabetes mellitus without complication ([place])   . Dyspnea   . GERD (gastroesophageal reflux disease)   . H/O tricuspid valve repair 2012  . History of echocardiogram    Echo at [facility] in [facility] 4/12: mod LVH, EF 60-65%, mod LAE, tissue MVR ok, mod TR, mod pulmo HTN with RVSP 48 mmHg  . Hypertension   . Obstructive sleep apnea   . Paroxysmal atrial fibrillation (HCC)    s/p Maze procedure at time of MVR in 2011  . Pulmonary HTN ([place])   . 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 AV FISTULA PLACEMENT Left 07/13/2017   Procedure: LEFT ARM ARTERIOVENOUS (AV) FISTULA CREATION;  Surgeon: Waynetta Sandy, MD;  Location: [place];  Service: Vascular;  Laterality: Left;  . CARDIAC SURGERY    . CARDIAC VALVE REPLACEMENT  2011  . CARDIOVERSION N/A 07/09/2017   Procedure: CARDIOVERSION;  Surgeon: Larey Dresser, MD;  [facility];  Service: Cardiovascular;  Laterality: N/A;  . COLONOSCOPY N/A 07/16/2017   Procedure: COLONOSCOPY;  Surgeon: Jerene Bears, MD;  Location: [facility] ENDOSCOPY;  Service: Gastroenterology;  Laterality: N/A;  . ESOPHAGOGASTRODUODENOSCOPY N/A 07/16/2017   Procedure: ESOPHAGOGASTRODUODENOSCOPY (EGD);  Surgeon: Jerene Bears, MD;  Location: [facility] ENDOSCOPY;  Service: Gastroenterology;  Laterality: N/A;  . INSERTION OF DIALYSIS  CATHETER Right 07/13/2017   Procedure: INSERTION OF DIALYSIS CATHETER RIGHT INTERNAL JUGULAR;  Surgeon: Waynetta Sandy, MD;  Location: [place];  Service: Vascular;  Laterality: Right;  . RIGHT HEART CATH N/A 06/29/2017   Procedure: RIGHT HEART CATH;  Surgeon: Larey Dresser, MD;  Location: [place] CV LAB;  Service: Cardiovascular;  Laterality: N/A;  . RIGHT/LEFT HEART CATH AND CORONARY ANGIOGRAPHY N/A 08/19/2017   Procedure: RIGHT/LEFT HEART CATH AND CORONARY ANGIOGRAPHY;  Surgeon: Leonie Man, MD;  Location: [place] CV LAB;  Service: Cardiovascular;  Laterality: N/A;  . TEE WITHOUT CARDIOVERSION N/A 07/09/2017   Procedure: TRANSESOPHAGEAL ECHOCARDIOGRAM (TEE);  Surgeon: Larey Dresser, MD;  Location: [facility] ENDOSCOPY;  Service: Cardiovascular;  Laterality: N/A;    No Known Allergies   Outpatient Medications Prior to Visit  Medication Sig Dispense Refill  . ACCU-CHEK SOFTCLIX LANCETS lancets USE AS INSTRUCTED 3 TIMES DAILY 100 each 5  . acetaminophen (TYLENOL) 650 MG CR tablet Take 650 mg by mouth every 6 (six) hours as needed for pain.    Marland Kitchen acetaminophen-codeine (TYLENOL #3) 300-30 MG tablet Take 1 tablet by mouth every 12 (twelve) hours as needed for moderate pain. 40 tablet 1  . allopurinol (ZYLOPRIM) 300 MG tablet Take 0.5 tablets (150 mg total) by mouth daily. 15 tablet 3  . atorvastatin (LIPITOR) 20 MG tablet Take 1 tablet (20 mg total) by mouth daily. 30 tablet 3  . Blood Glucose Monitoring Suppl (ACCU-CHEK AVIVA PLUS) w/Device KIT USE AS INSTRUCTED 3 TIMES DAILY. E11.9 1 kit 0  . calcium acetate (PHOSLO) 667 MG capsule Take 1 capsule (667 mg total) by mouth 3 (three) times daily with meals. 90 capsule 3  . colchicine 0.6 MG tablet Take 1.2 mg by mouth daily as needed for pain.  0  . Darbepoetin Alfa (ARANESP) 200 MCG/0.4ML SOSY injection Inject 0.4 mLs (200 mcg total) into the skin every Saturday at 6 PM. 1.68 mL   . diclofenac sodium (VOLTAREN) 1 % GEL Apply 4 g  topically 4 (four) times daily.    Marland Kitchen docusate sodium (COLACE) 100 MG capsule Take 1 capsule (100 mg total) by mouth 2 (two) times daily. 10 capsule 0  . folic acid (FOLVITE) 1 MG tablet TAKE 1 TABLET BY MOUTH DAILY. 30 tablet 2  . glucose blood (ACCU-CHEK AVIVA PLUS) test strip USE 3 TIMES DAILY AS DIRECTED. 100 each 12  . metoprolol succinate (TOPROL-XL) 25 MG 24 hr tablet Take 1 tablet (25 mg total) by mouth 2 (two) times daily. 60 tablet 6  . midodrine (PROAMATINE) 5 MG tablet Take 1 tablet (5 mg total) by mouth 3 (three) times daily with meals. 90 tablet 6  . omeprazole (PRILOSEC) 20 MG capsule Take 1 capsule (20 mg total) by mouth daily. 30 capsule 5  . OXYGEN Inhale 2 L into the lungs daily.    Marland Kitchen tiZANidine (ZANAFLEX) 4 MG tablet Take 1 tablet (4 mg total) by mouth every 8 (eight) hours as needed for muscle spasms. 60 tablet 1  . warfarin (COUMADIN) 2.5 MG tablet Take 1 tablet (2.5 mg total) by mouth daily at 6 PM. Or as directed by coumadin clinic 30 tablet 0  . Insulin Detemir (LEVEMIR FLEXTOUCH) 100 UNIT/ML Pen Inject 10 Units  into the skin daily at 10 pm. 15 mL 11   No facility-administered medications prior to visit.     ROS Review of Systems  Constitutional: Negative for activity change and appetite change.  HENT: Negative for sinus pressure and sore throat.   Eyes: Negative for visual disturbance.  Respiratory: Negative for cough, chest tightness and shortness of breath.   Cardiovascular: Negative for chest pain and leg swelling.  Gastrointestinal: Negative for abdominal distention, abdominal pain, constipation and diarrhea.  Endocrine: Negative.   Genitourinary: Negative for dysuria.  Musculoskeletal: Positive for neck pain. Negative for joint swelling and myalgias.  Skin: Negative for rash.  Allergic/Immunologic: Negative.   Neurological: Negative for weakness, light-headedness and numbness.  Psychiatric/Behavioral: Negative for dysphoric mood and suicidal ideas.     Objective:  BP 102/65 (BP Location: Left Arm, Patient Position: Sitting, Cuff Size: Normal)   Pulse 72   Temp (!) 97.5 F (36.4 C) (Oral)   Resp 18   Ht '5\' 5"'  (1.651 m)   Wt 168 lb 6.4 oz (76.4 kg)   SpO2 97%   BMI 28.02 kg/m   BP/Weight 09/01/2017 08/26/2017 6/60/6004  Systolic BP 599 774 90  Diastolic BP 65 61 50  Wt. (Lbs) 168.4 165.79 -  BMI 28.02 27.59 -      Physical Exam  Constitutional: He is oriented to person, place, and time. He appears well-developed and well-nourished.  Cardiovascular: Normal rate, normal heart sounds and intact distal pulses.  An irregularly irregular rhythm present.  No murmur heard. Pulmonary/Chest: Effort normal and breath sounds normal. He has no wheezes. He has no rales. He exhibits no tenderness.  Abdominal: Soft. Bowel sounds are normal. He exhibits no distension and no mass. There is no tenderness.  Musculoskeletal: He exhibits tenderness (tenderness on palpation of left trapezius muscle).  Left arm AV fistula with surrounding skin changes in keeping with resolving edema, slight hyperemia, no warmth Palpable thrill  Neurological: He is alert and oriented to person, place, and time.  Skin: Skin is warm and dry.  Psychiatric: He has a normal mood and affect.    Lab Results  Component Value Date   HGBA1C 5.2 09/01/2017    Assessment & Plan:   1. Insulin dependent diabetes mellitus (HCC) A1c 5.2 Discontinue Levemir to prevent hypoglycemia Diabetic diet - Glucose (CBG) - HgB A1c  2. Permanent atrial fibrillation (HCC) Continue anticoagulation with Coumadin (monitored by the Coumadin clinic); last INR was 2.8 six days ago Continue rate control with metoprolol  3. ESRD (end stage renal disease) on dialysis [facility]) Continue dialysis as per schedule Follow-up with Dr. Donzetta Matters (vascular) re-AV fistula  4. Muscle spasm Symptoms could be secondary to wrong positioning Currently on a muscle relaxant and Tylenol 3 Advised to apply  heat and massage his neck muscles  5. Left arm cellulitis Resolved Completed a course of antibiotic  6. S/p Cardiac arrest [facility]) Multiple risk factors - he is a high risk patient at risk of frequent readmissions Status post cardiac cath-RPDA lesion, 100% stenosed not PCI option as per cardiology Risk factor modification, continue medications He has not been compliant with cardiology follow-ups in the past We have obtained an appointment for him today. - Ambulatory referral to Cardiology   No orders of the defined types were placed in this encounter.   Follow-up: Return in about 10 days (around 09/11/2017) for TCC - follow-up of chronic medical conditions.   [name] MD

## 2017-09-01 NOTE — Telephone Encounter (Signed)
Met with the patient when he was in the clinic today to see Dr Jarold Song. He was accompanied by  Marylyn Ishihara [name] interpreter with Language resources.  Scheduled him an appointment with cardiology for 09/03/17 @ 1030. This information was placed on his clinic AVS and he was also informed of the appointment via the interpreter.   He also explained that he needs a [name] assessment. Call placed to [name] and spoke to [place] who said that he is not certified to use [name] indefinitely.  He has been scheduled for an assessment on 09/10/17 @ 1200 with an interpreter. The  Interpreter in the clinic provided him with this information. As per Anderson Malta, her was only pre-certified for dialysis appointments.   The patient stated that he has a home health nurse that comes twice a week and he does not want any other services or help.  He has refused tele-monitoring.

## 2017-09-01 NOTE — Telephone Encounter (Signed)
See Elevated INR/Coumadin Medication management telephone encounter.

## 2017-09-01 NOTE — Telephone Encounter (Signed)
New message   Critical elevated PTINR  If Home Health RN is calling please get Coumadin Nurse on the phone STAT  1.  Are you calling in regards to an appointment? no  2.  Are you calling for a refill ? no  3.  Are you having bleeding issues? no  4.  Do you need clearance to hold Coumadin? no   Please route to the Coumadin Clinic Pool

## 2017-09-02 ENCOUNTER — Inpatient Hospital Stay (HOSPITAL_COMMUNITY)
Admission: EM | Admit: 2017-09-02 | Discharge: 2017-09-06 | DRG: 640 | Disposition: A | Discharge status: Home / Self Care | Payer: Medicare Other | Attending: Student in an Organized Health Care Education/Training Program | Admitting: Student in an Organized Health Care Education/Training Program

## 2017-09-02 ENCOUNTER — Encounter (HOSPITAL_COMMUNITY): Payer: Self-pay

## 2017-09-02 ENCOUNTER — Emergency Department (HOSPITAL_COMMUNITY): Payer: Medicare Other

## 2017-09-02 DIAGNOSIS — R000 Tachycardia, unspecified: Secondary | ICD-10-CM | POA: Diagnosis present

## 2017-09-02 DIAGNOSIS — Z23 Encounter for immunization: Secondary | ICD-10-CM

## 2017-09-02 DIAGNOSIS — M546 Pain in thoracic spine: Secondary | ICD-10-CM | POA: Diagnosis not present

## 2017-09-02 DIAGNOSIS — Z87891 Personal history of nicotine dependence: Secondary | ICD-10-CM

## 2017-09-02 DIAGNOSIS — D638 Anemia in other chronic diseases classified elsewhere: Secondary | ICD-10-CM | POA: Diagnosis present

## 2017-09-02 DIAGNOSIS — R0602 Shortness of breath: Secondary | ICD-10-CM

## 2017-09-02 DIAGNOSIS — I471 Supraventricular tachycardia: Secondary | ICD-10-CM | POA: Diagnosis not present

## 2017-09-02 DIAGNOSIS — I480 Paroxysmal atrial fibrillation: Secondary | ICD-10-CM | POA: Diagnosis present

## 2017-09-02 DIAGNOSIS — Z7901 Long term (current) use of anticoagulants: Secondary | ICD-10-CM

## 2017-09-02 DIAGNOSIS — N2581 Secondary hyperparathyroidism of renal origin: Secondary | ICD-10-CM | POA: Diagnosis not present

## 2017-09-02 DIAGNOSIS — I2729 Other secondary pulmonary hypertension: Secondary | ICD-10-CM | POA: Diagnosis present

## 2017-09-02 DIAGNOSIS — I5082 Biventricular heart failure: Secondary | ICD-10-CM | POA: Diagnosis present

## 2017-09-02 DIAGNOSIS — Z992 Dependence on renal dialysis: Secondary | ICD-10-CM

## 2017-09-02 DIAGNOSIS — Z79899 Other long term (current) drug therapy: Secondary | ICD-10-CM

## 2017-09-02 DIAGNOSIS — I482 Chronic atrial fibrillation: Secondary | ICD-10-CM | POA: Diagnosis present

## 2017-09-02 DIAGNOSIS — Z9981 Dependence on supplemental oxygen: Secondary | ICD-10-CM

## 2017-09-02 DIAGNOSIS — I953 Hypotension of hemodialysis: Secondary | ICD-10-CM | POA: Diagnosis present

## 2017-09-02 DIAGNOSIS — E119 Type 2 diabetes mellitus without complications: Secondary | ICD-10-CM | POA: Diagnosis not present

## 2017-09-02 DIAGNOSIS — I499 Cardiac arrhythmia, unspecified: Secondary | ICD-10-CM | POA: Diagnosis not present

## 2017-09-02 DIAGNOSIS — I272 Pulmonary hypertension, unspecified: Secondary | ICD-10-CM | POA: Diagnosis present

## 2017-09-02 DIAGNOSIS — E8770 Fluid overload, unspecified: Principal | ICD-10-CM | POA: Diagnosis present

## 2017-09-02 DIAGNOSIS — J9611 Chronic respiratory failure with hypoxia: Secondary | ICD-10-CM | POA: Diagnosis present

## 2017-09-02 DIAGNOSIS — G4733 Obstructive sleep apnea (adult) (pediatric): Secondary | ICD-10-CM | POA: Diagnosis present

## 2017-09-02 DIAGNOSIS — E876 Hypokalemia: Secondary | ICD-10-CM | POA: Diagnosis not present

## 2017-09-02 DIAGNOSIS — Z952 Presence of prosthetic heart valve: Secondary | ICD-10-CM

## 2017-09-02 DIAGNOSIS — R079 Chest pain, unspecified: Secondary | ICD-10-CM | POA: Diagnosis not present

## 2017-09-02 DIAGNOSIS — I4510 Unspecified right bundle-branch block: Secondary | ICD-10-CM | POA: Diagnosis present

## 2017-09-02 DIAGNOSIS — E785 Hyperlipidemia, unspecified: Secondary | ICD-10-CM | POA: Diagnosis present

## 2017-09-02 DIAGNOSIS — K219 Gastro-esophageal reflux disease without esophagitis: Secondary | ICD-10-CM | POA: Diagnosis present

## 2017-09-02 DIAGNOSIS — I2510 Atherosclerotic heart disease of native coronary artery without angina pectoris: Secondary | ICD-10-CM | POA: Diagnosis present

## 2017-09-02 DIAGNOSIS — I071 Rheumatic tricuspid insufficiency: Secondary | ICD-10-CM | POA: Diagnosis present

## 2017-09-02 DIAGNOSIS — D631 Anemia in chronic kidney disease: Secondary | ICD-10-CM | POA: Diagnosis present

## 2017-09-02 DIAGNOSIS — Z8674 Personal history of sudden cardiac arrest: Secondary | ICD-10-CM

## 2017-09-02 DIAGNOSIS — I9589 Other hypotension: Secondary | ICD-10-CM | POA: Diagnosis present

## 2017-09-02 DIAGNOSIS — D509 Iron deficiency anemia, unspecified: Secondary | ICD-10-CM | POA: Diagnosis not present

## 2017-09-02 DIAGNOSIS — F419 Anxiety disorder, unspecified: Secondary | ICD-10-CM | POA: Diagnosis present

## 2017-09-02 DIAGNOSIS — N186 End stage renal disease: Secondary | ICD-10-CM | POA: Diagnosis present

## 2017-09-02 DIAGNOSIS — I132 Hypertensive heart and chronic kidney disease with heart failure and with stage 5 chronic kidney disease, or end stage renal disease: Secondary | ICD-10-CM | POA: Diagnosis present

## 2017-09-02 DIAGNOSIS — E1122 Type 2 diabetes mellitus with diabetic chronic kidney disease: Secondary | ICD-10-CM | POA: Diagnosis present

## 2017-09-02 DIAGNOSIS — I5042 Chronic combined systolic (congestive) and diastolic (congestive) heart failure: Secondary | ICD-10-CM | POA: Diagnosis present

## 2017-09-02 LAB — CBC
HCT: 31.4 % — ABNORMAL LOW (ref 39.0–52.0)
HEMATOCRIT: 33 % — AB (ref 39.0–52.0)
HEMOGLOBIN: 10.2 g/dL — AB (ref 13.0–17.0)
Hemoglobin: 10.5 g/dL — ABNORMAL LOW (ref 13.0–17.0)
MCH: 26.3 pg (ref 26.0–34.0)
MCH: 26.7 pg (ref 26.0–34.0)
MCHC: 31.8 g/dL (ref 30.0–36.0)
MCHC: 32.5 g/dL (ref 30.0–36.0)
MCV: 82.5 fL (ref 78.0–100.0)
MCV: 82.2 fL (ref 78.0–100.0)
PLATELETS: 117 10*3/uL — AB (ref 150–400)
Platelets: 106 10*3/uL — ABNORMAL LOW (ref 150–400)
RBC: 4 MIL/uL — ABNORMAL LOW (ref 4.22–5.81)
RBC: 3.82 MIL/uL — AB (ref 4.22–5.81)
RDW: 19 % — AB (ref 11.5–15.5)
RDW: 18.6 % — ABNORMAL HIGH (ref 11.5–15.5)
WBC: 6.3 10*3/uL (ref 4.0–10.5)
WBC: 5.1 10*3/uL (ref 4.0–10.5)

## 2017-09-02 LAB — I-STAT TROPONIN, ED: TROPONIN I, POC: 0 ng/mL (ref 0.00–0.08)

## 2017-09-02 LAB — BASIC METABOLIC PANEL
Anion gap: 11 (ref 5–15)
BUN: 17 mg/dL (ref 6–20)
CHLORIDE: 96 mmol/L — AB (ref 101–111)
CO2: 29 mmol/L (ref 22–32)
Calcium: 8.9 mg/dL (ref 8.9–10.3)
Creatinine, Ser: 3.92 mg/dL — ABNORMAL HIGH (ref 0.61–1.24)
GFR calc Af Amer: 18 mL/min — ABNORMAL LOW (ref >60)
GFR calc non Af Amer: 16 mL/min — ABNORMAL LOW (ref >60)
GLUCOSE: 87 mg/dL (ref 65–99)
POTASSIUM: 3.8 mmol/L (ref 3.5–5.1)
Sodium: 136 mmol/L (ref 135–145)

## 2017-09-02 LAB — RENAL FUNCTION PANEL
ALBUMIN: 2.9 g/dL — AB (ref 3.5–5.0)
ANION GAP: 10 (ref 5–15)
BUN: 18 mg/dL (ref 6–20)
CALCIUM: 8.7 mg/dL — AB (ref 8.9–10.3)
CO2: 29 mmol/L (ref 22–32)
CREATININE: 4.08 mg/dL — AB (ref 0.61–1.24)
Chloride: 97 mmol/L — ABNORMAL LOW (ref 101–111)
GFR, EST AFRICAN AMERICAN: 17 mL/min — AB (ref >60)
GFR, EST NON AFRICAN AMERICAN: 15 mL/min — AB (ref >60)
Glucose, Bld: 108 mg/dL — ABNORMAL HIGH (ref 65–99)
PHOSPHORUS: 3.4 mg/dL (ref 2.5–4.6)
Potassium: 3.6 mmol/L (ref 3.5–5.1)
SODIUM: 136 mmol/L (ref 135–145)

## 2017-09-02 LAB — PROTIME-INR
INR: 3.44
Prothrombin Time: 34.4 seconds — ABNORMAL HIGH (ref 11.4–15.2)

## 2017-09-02 MED ORDER — LIDOCAINE-PRILOCAINE 2.5-2.5 % EX CREA: 1.0000 "application " | TOPICAL_CREAM | CUTANEOUS | Status: DC | PRN

## 2017-09-02 MED ORDER — PENTAFLUOROPROP-TETRAFLUOROETH EX AERO: 1.0000 "application " | INHALATION_SPRAY | CUTANEOUS | Status: DC | PRN

## 2017-09-02 MED ORDER — ALTEPLASE 2 MG IJ SOLR: 2.0000 mg | Freq: Once | INTRAMUSCULAR | Status: DC | PRN

## 2017-09-02 MED ORDER — LIDOCAINE HCL (PF) 1 % IJ SOLN: 5.0000 mL | INTRAMUSCULAR | Status: DC | PRN

## 2017-09-02 MED ORDER — MORPHINE SULFATE (PF) 4 MG/ML IV SOLN
1.0000 mg | Freq: Once | INTRAVENOUS | Status: AC
  Administered 2017-09-02: 1 mg via INTRAVENOUS

## 2017-09-02 MED ORDER — SODIUM CHLORIDE 0.9 % IV SOLN: 100.0000 mL | INTRAVENOUS | Status: DC | PRN

## 2017-09-02 MED ORDER — HEPARIN SODIUM (PORCINE) 1000 UNIT/ML DIALYSIS
1000.0000 [IU] | INTRAMUSCULAR | Status: DC | PRN
  Administered 2017-09-03: 1000 [IU] via INTRAVENOUS_CENTRAL

## 2017-09-02 MED ORDER — HEPARIN SODIUM (PORCINE) 1000 UNIT/ML DIALYSIS
20.0000 [IU]/kg | INTRAMUSCULAR | Status: DC | PRN
  Administered 2017-09-02: 1500 [IU] via INTRAVENOUS_CENTRAL

## 2017-09-02 MED ORDER — MORPHINE SULFATE (PF) 4 MG/ML IV SOLN
INTRAVENOUS | Status: AC
  Administered 2017-09-02: 1 mg via INTRAVENOUS
  Filled 2017-09-02: qty 1

## 2017-09-02 NOTE — ED Provider Notes (Signed)
[facility] EMERGENCY DEPARTMENT Provider Note   CSN: [id number] Arrival date & time: 09/02/17  1342    History   Chief Complaint Chief Complaint  Patient presents with  . Back Pain  . Irregular Heart Beat    HPI Michael [name] is a 56 y.o. male with history of recent cardiac arrest, ESRD on HD, T2DM, Afib on Coumadin, cirrhosis, HF with EF 50% 07/2017 who presents to the ED with SOB that started during HD and increased oxygen requirements (though no documented hypoxia). Uses O2 at home, 2L. Did not finish HD today. Patient was recently admitted for cardiac arrest. He was discharged on 10/10 with antibiotics for left lower extremity cellulitis. States he has been doing well at home until 2 days ago when he started experiencing mid back pain. Denies recent trauma or falls. Unclear chest pain started. He states it is not associated with shortness of breath, nausea, vomiting, or diaphoresis. It is nonradiating. Denies sick contacts, fever, chills, palpitations, abdominal pain, and changes in bowel movements.   HPI  Past Medical History:  Diagnosis Date  . Anemia of chronic disease   . Asthma    said he does not know  . Chronic kidney disease (CKD), stage IV (severe) ([place])   . Chronic systolic CHF (congestive heart failure) (HCC)    a. prior EF normal; b. Echo 10/16: Mild LVH, EF 30-35%, mitral valve bioprosthesis present without evidence of stenosis or regurgitation, severe LAE, mild RVE with mild to moderately reduced RVSF, moderate RAE  . Coronary artery disease   . Diabetes mellitus type 2 in obese ([place])   . Diabetes mellitus without complication ([place])   . Dyspnea   . GERD (gastroesophageal reflux disease)   . H/O tricuspid valve repair 2012  . History of echocardiogram    Echo at [facility] in [facility] 4/12: mod LVH, EF 60-65%, mod LAE, tissue MVR ok, mod TR, mod pulmo HTN with RVSP 48 mmHg  . Hypertension   . Obstructive sleep apnea   . Paroxysmal atrial  fibrillation (HCC)    s/p Maze procedure at time of MVR in 2011  . Pulmonary HTN ([place])   . S/P mitral valve replacement with porcine valve 2012    Patient Active Problem List   Diagnosis Date Noted  . Chronic renal failure   . Advance care planning   . Pulmonary hypertension, unspecified ([place])   . Chest pain   . Biventricular heart failure ([place]) 08/10/2017  . Syncope and collapse 08/10/2017  . Cardiac arrest ([place])   . Benign neoplasm of ascending colon   . Rectal bleeding   . ESRD (end stage renal disease) on dialysis ([place])   . Goals of care, counseling/discussion   . Palliative care by specialist   . Acute kidney injury ([place]) 06/25/2017  . Anasarca 05/30/2017  . HLD (hyperlipidemia) 10/05/2016  . Mouth bleeding 10/05/2016  . Bilateral knee pain 10/05/2016  . Petechial rash 10/05/2016  . Laceration of tongue   . Insomnia 06/10/2016  . Supratherapeutic INR 06/01/2016  . Acute hepatic encephalopathy 03/15/2016  . Liver cirrhosis ([place]) 03/15/2016  . Volume overload 03/15/2016  . Acute congestive heart failure ([place])   . Insulin dependent diabetes mellitus ([place])   . Acute renal failure (ARF) ([place]) 03/14/2016  . GERD (gastroesophageal reflux disease) 03/04/2016  . Aortic regurgitation 02/27/2016  . Viral gastroenteritis 02/27/2016  . A-fib ([place]) 02/26/2016  . Abdominal pain 02/25/2016  . Diabetes mellitus with complication ([place]) 71/04/2693  . Permanent  atrial fibrillation ([place]) 02/25/2016  . Anemia, chronic disease 02/25/2016  . Pain in joint, ankle and foot 01/10/2016  . Asthma   . Acute hypoxemic respiratory failure ([place]) 12/29/2015  . Acute respiratory failure ([place]) 12/29/2015  . Chronic atrial fibrillation ([place]) 12/24/2015  . Dilated cardiomyopathy ([place]) 12/24/2015  . Polypharmacy 11/23/2015  . Gout 10/23/2015  . Encounter for therapeutic drug monitoring 10/19/2015  . Anemia - mild exacerbation 10/15/2015  . Tricuspid valve disease 10/09/2015  . Mitral valve  disease   . CKD (chronic kidney disease), stage IV ([place]) 09/10/2015  . Calf pain 09/10/2015  . DM type 2 (diabetes mellitus, type 2) ([place]) 09/10/2015  . Essential hypertension 09/10/2015    Past Surgical History:  Procedure Laterality Date  . AV FISTULA PLACEMENT Left 07/13/2017   Procedure: LEFT ARM ARTERIOVENOUS (AV) FISTULA CREATION;  Surgeon: Waynetta Sandy, MD;  Location: [facility];  Service: Vascular;  Laterality: Left;  . CARDIAC SURGERY    . CARDIAC VALVE REPLACEMENT  2011  . CARDIOVERSION N/A 07/09/2017   Procedure: CARDIOVERSION;  Surgeon: Larey Dresser, MD;  Location: [facility] ENDOSCOPY;  Service: Cardiovascular;  Laterality: N/A;  . COLONOSCOPY N/A 07/16/2017   Procedure: COLONOSCOPY;  Surgeon: Jerene Bears, MD;  Location: [facility];  Service: Gastroenterology;  Laterality: N/A;  . ESOPHAGOGASTRODUODENOSCOPY N/A 07/16/2017   Procedure: ESOPHAGOGASTRODUODENOSCOPY (EGD);  Surgeon: Jerene Bears, MD;  Location: [facility] ENDOSCOPY;  Service: Gastroenterology;  Laterality: N/A;  . INSERTION OF DIALYSIS CATHETER Right 07/13/2017   Procedure: INSERTION OF DIALYSIS CATHETER RIGHT INTERNAL JUGULAR;  Surgeon: Waynetta Sandy, MD;  Location: [facility];  Service: Vascular;  Laterality: Right;  . RIGHT HEART CATH N/A 06/29/2017   Procedure: RIGHT HEART CATH;  Surgeon: Larey Dresser, MD;  Location: [facility] CV LAB;  Service: Cardiovascular;  Laterality: N/A;  . RIGHT/LEFT HEART CATH AND CORONARY ANGIOGRAPHY N/A 08/19/2017   Procedure: RIGHT/LEFT HEART CATH AND CORONARY ANGIOGRAPHY;  Surgeon: Leonie Man, MD;  Location: [facility] CV LAB;  Service: Cardiovascular;  Laterality: N/A;  . TEE WITHOUT CARDIOVERSION N/A 07/09/2017   Procedure: TRANSESOPHAGEAL ECHOCARDIOGRAM (TEE);  Surgeon: Larey Dresser, MD;  Location: [facility] ENDOSCOPY;  Service: Cardiovascular;  Laterality: N/A;       Home Medications    Prior to Admission medications   Medication Sig Start Date End Date  Taking? Authorizing Provider  ACCU-CHEK SOFTCLIX LANCETS lancets USE AS INSTRUCTED 3 TIMES DAILY 08/04/17   [name], MD  acetaminophen-codeine (TYLENOL #3) 300-30 MG tablet Take 1 tablet by mouth every 12 (twelve) hours as needed for moderate pain. 08/04/17   [name], MD  allopurinol (ZYLOPRIM) 300 MG tablet Take 0.5 tablets (150 mg total) by mouth daily. 07/28/17   Shirley Friar, PA-C  atorvastatin (LIPITOR) 20 MG tablet Take 1 tablet (20 mg total) by mouth daily. 07/28/17   Shirley Friar, PA-C  Blood Glucose Monitoring Suppl (ACCU-CHEK AVIVA PLUS) w/Device KIT USE AS INSTRUCTED 3 TIMES DAILY. E11.9 08/31/17   [name], MD  calcium acetate (PHOSLO) 667 MG capsule Take 1 capsule (667 mg total) by mouth 3 (three) times daily with meals. 07/27/17   Shirley Friar, PA-C  colchicine 0.6 MG tablet Take 1.2 mg by mouth daily as needed for pain. 06/02/17   [provider]  Darbepoetin Alfa (ARANESP) 200 MCG/0.4ML SOSY injection Inject 0.4 mLs (200 mcg total) into the skin every Saturday at 6 PM. 08/01/17   Tillery, Satira Mccallum, PA-C  diclofenac sodium (VOLTAREN)  1 % GEL Apply 4 g topically 4 (four) times daily.    [provider]  docusate sodium (COLACE) 100 MG capsule Take 1 capsule (100 mg total) by mouth 2 (two) times daily. 07/27/17   Shirley Friar, PA-C  folic acid (FOLVITE) 1 MG tablet TAKE 1 TABLET BY MOUTH DAILY. 04/20/17   [name], MD  glucose blood (ACCU-CHEK AVIVA PLUS) test strip USE 3 TIMES DAILY AS DIRECTED. 08/04/17   [name], MD  metoprolol succinate (TOPROL-XL) 25 MG 24 hr tablet Take 1 tablet (25 mg total) by mouth 2 (two) times daily. 07/27/17   Shirley Friar, PA-C  midodrine (PROAMATINE) 5 MG tablet Take 1 tablet (5 mg total) by mouth 3 (three) times daily with meals. 07/27/17   Shirley Friar, PA-C  omeprazole (PRILOSEC) 20 MG capsule Take 1 capsule (20 mg total) by mouth daily. 03/03/17    [name], MD  OXYGEN Inhale 2 L into the lungs daily.    [provider]  tiZANidine (ZANAFLEX) 4 MG tablet Take 1 tablet (4 mg total) by mouth every 8 (eight) hours as needed for muscle spasms. 08/04/17   [name], MD  warfarin (COUMADIN) 2.5 MG tablet Take 1 tablet (2.5 mg total) by mouth daily at 6 PM. Or as directed by coumadin clinic 07/27/17   Shirley Friar, PA-C    Family History Family History  Problem Relation Age of Onset  . Heart attack Neg Hx     Social History Social History  Substance Use Topics  . Smoking status: Former Smoker    Packs/day: 1.00    Types: Cigarettes  . Smokeless tobacco: Former Systems developer     Comment: said he is not a regular smoker  . Alcohol use No     Allergies   Patient has no known allergies.   Review of Systems Review of Systems  Constitutional: Positive for activity change. Negative for appetite change, chills and fever.  Respiratory: Positive for chest tightness and shortness of breath. Negative for cough and wheezing.   Cardiovascular: Positive for chest pain. Negative for palpitations and leg swelling.  Gastrointestinal: Negative for abdominal distention, abdominal pain, blood in stool, constipation, diarrhea and nausea.  Genitourinary: Negative for difficulty urinating.  Musculoskeletal: Positive for back pain.  Neurological: Positive for weakness and light-headedness. Negative for dizziness and headaches.     Physical Exam Updated Vital Signs BP 117/79   Pulse 86   Temp (!) 97.4 F (36.3 C) (Oral)   Resp 15   Ht '5\' 5"'  (1.651 m)   Wt 76.2 kg (168 lb)   SpO2 100%   BMI 27.96 kg/m   Physical Exam  Constitutional: He is oriented to person, place, and time. He appears well-developed and well-nourished. No distress.  Cardiovascular:  Irregularly irregular rhythm, normal S1 and S2, no MRG noted, no JVD noted  Pulmonary/Chest:  Referred is normal. There is decreased breath sounds in the bilateral bases.  No wheezing or crackles. No increased WOB while on 2L of O2. Able to speak in full sentences   Abdominal: Soft. Bowel sounds are normal. He exhibits no distension. There is no tenderness. There is no rebound and no guarding.  Musculoskeletal:  There is bilateral paraspinal tenderness on the mid back. No point tenderness in the middle. No pressure ulcers or lesions noted.  No lower extremity swelling noted.  Neurological: He is alert and oriented to person, place, and time.  Skin: He is not diaphoretic.  ED Treatments / Results  Labs (all labs ordered are listed, but only abnormal results are displayed) Labs Reviewed  BASIC METABOLIC PANEL - Abnormal; Notable for the following:       Result Value   Chloride 96 (*)    Creatinine, Ser 3.92 (*)    GFR calc non Af Amer 16 (*)    GFR calc Af Amer 18 (*)    All other components within normal limits  CBC - Abnormal; Notable for the following:    RBC 4.00 (*)    Hemoglobin 10.5 (*)    HCT 33.0 (*)    RDW 19.0 (*)    Platelets 106 (*)    All other components within normal limits  PROTIME-INR - Abnormal; Notable for the following:    Prothrombin Time 34.4 (*)    All other components within normal limits  I-STAT TROPONIN, ED    EKG  EKG Interpretation None       Radiology Dg Chest 2 View  Result Date: 09/02/2017 CLINICAL DATA:  Shortness of breath, atrial fibrillation, back pain. Dialysis dependent renal failure. The patient was unable to fill with initiates treatment today. EXAM: CHEST  2 VIEW COMPARISON:  PA and lateral chest x-ray of August 20, 2017 FINDINGS: The lungs are reasonably well inflated. There are bilateral pleural effusions greatest on the right. The interstitial markings are increased. The pulmonary vascularity is engorged. The cardiac silhouette is enlarged. The dialysis catheter tip projects over the midportion of the SVC. There is calcification in the wall of the aortic arch. IMPRESSION: CHF with mild  interstitial edema and small bilateral pleural effusions. Thoracic aortic atherosclerosis. Electronically Signed   By: David  Martinique M.D.   On: 09/02/2017 15:02    Procedures Procedures (including critical care time)  Medications Ordered in ED Medications - No data to display   Initial Impression / Assessment and Plan / ED Course  I have reviewed the triage vital signs and the nursing notes.  Pertinent labs & imaging results that were available during my care of the patient were reviewed by me and considered in my medical decision making (see chart for details).    Michael [name] is a 56 y.o. male with history of recent cardiac arrest, ESRD on HD, T2DM, Afib on Coumadin, cirrhosis, HF with EF 50% 07/2017 who presents to the ED with SOB that started during HD and increased oxygen requirements (though no documented hypoxia). He appears chronically ill on exam and reports paraspinal mid back tenderness, but no other abnormalities on exam. Back with FROM and no pressure ulcers or midline tenderness. He is currently in Afib, but HR is stable and he is hemodynamically stable. CBC and BMP consistent with ESRD. EKG with Afib which is not new. INR, I stat troponin, and CXR pending. Will follow up with cardiac workup and monitor respiratory status. Back pain likely secondary to muscles spasm . Low suspicion for infection of fracture as he has full ROM and does not report signs/symptoms of infection. WBC 4. Does not appear like patient will need admission.   1505 INR 3.44 form 8.9 yesterday. No signs of active bleeding at this time. Hgb 10.5 (at baseline). Troponin negative x1. CXR with interstitial edema and bilateral pleural effusions (not very different from prior on 10/4). Currently satting well on 2L O2 by [place].   4270 Patient is now reporting worsening chest pain and SOB. Will need admission for chest pain r/o and likely HD since he did not finish his HD  session today, though he does not appear  volume overloaded on exam.  1528 Spoke to Dr. Marlowe Sax with [facility]. Will admit patient. They will also touch base with nephrology after evaluating the patient.   Case discussed with Dr. Laverta Baltimore.    Final Clinical Impressions(s) / ED Diagnoses   Final diagnoses:  Shortness of breath  Chest pain, unspecified type  Acute bilateral thoracic back pain    New Prescriptions New Prescriptions   No medications on file     Welford Roche, MD 09/02/17 Ranlo, Wonda Olds, MD 09/03/17 [phone]

## 2017-09-02 NOTE — ED Triage Notes (Signed)
Pt arrived from dialysis where he was c/o SOB and monitor showed he was in A Fib. Pt also c/o back pain upon arrival. Pt states he did not finish his dialysis today.

## 2017-09-02 NOTE — H&P (Signed)
   Date: 09/02/2017               Patient Name:  Michael [name] MRN: [id number]  DOB: 05/15/1961 Age / Sex: 56 y.o., male   PCP: Amao, Enobong, MD         Medical Service: Internal Medicine Teaching Service         Attending Physician: Dr. Vincent, Duncan Thomas, *    First Contact: Dr. Helberg, Justin Pager: [phone]  Second Contact: Dr. Hoffman, Jessica Pager: [phone]       After Hours (After 5p/  First Contact Pager: [phone]  weekends / holidays): Second Contact Pager: [phone]   Chief Complaint: SOB  History of Present Illness: Pt is a 56 yo male with PMH of T2DM, ESRD on MWFSat dialysis, HTN, Atrial Fibrillation, HFpEF, GERD, Mitral valve replacement and Tricuspid valve repair.  Patient presents with acute shortness of breath that began during his dialysis session this morning.  He was not able to finish the dialysis session and was taken to the emergency department.  He denied any dizziness or lightheadedness, denied any cough or fevers, he does endorse some sternal pain with movement and palpation.  He was recently admitted to our service for a syncopal episode that occurred during dialysis and was reportedly pulseless and apneic during this time.  CPR was performed and an AED was applied, no shock was advised and ROSC occurred.  He was also found to have a left upper extremity cellulitis and his fistula site and was treated for this. Patient received a left heart cath that demonstrated RPDA lesion 100% stenosed with collaterals filling the distal vessel no PCI recommended.  The Difficulty has been removing fluid from the patient during dialysis, although he needs this fluid removed from a heart failure standpoint, the patient has chronic hypotension as well, which is exacerbated by this fluid removal.  His chest x-ray from today appears to actually be improved from the last study taken on October 4.  ED course: A chest x-ray was performed which showed CHF with mild interstitial  edema and small bilateral pleural effusions.  Patient was evaluated in ED was found to not be in distress was 100% oxygen saturation on 2 L, speaking in full sentences, no rales appreciated.  His INR was found to be 3.44, he had a normal white count and hemoglobin of 10.5, troponin of 0.00.    Meds:  Current Meds  Medication Sig  . ACCU-CHEK SOFTCLIX LANCETS lancets USE AS INSTRUCTED 3 TIMES DAILY  . acetaminophen (TYLENOL) 325 MG tablet Take 650 mg by mouth every 6 (six) hours.  . acetaminophen-codeine (TYLENOL #3) 300-30 MG tablet Take 1 tablet by mouth every 12 (twelve) hours as needed for moderate pain. (Patient taking differently: Take 1 tablet by mouth 2 (two) times daily. )  . allopurinol (ZYLOPRIM) 300 MG tablet Take 0.5 tablets (150 mg total) by mouth daily.  . atorvastatin (LIPITOR) 20 MG tablet Take 1 tablet (20 mg total) by mouth daily.  . Blood Glucose Monitoring Suppl (ACCU-CHEK AVIVA PLUS) w/Device KIT USE AS INSTRUCTED 3 TIMES DAILY. E11.9  . calcium acetate (PHOSLO) 667 MG capsule Take 1 capsule (667 mg total) by mouth 3 (three) times daily with meals. (Patient taking differently: Take 667-2,001 mg by mouth See admin instructions. Pt takes 2,001mg by mouth three times daily and 667mg twice daily with snacks)  . cephALEXin (KEFLEX) 500 MG capsule Take 500 mg by mouth at bedtime.  . colchicine 0.6 MG tablet   Take 1.2 mg by mouth daily.   . Darbepoetin Alfa (ARANESP) 200 MCG/0.4ML SOSY injection Inject 0.4 mLs (200 mcg total) into the skin every Saturday at 6 PM.  . diclofenac sodium (VOLTAREN) 1 % GEL Apply 4 g topically 4 (four) times daily.  . docusate sodium (COLACE) 100 MG capsule Take 1 capsule (100 mg total) by mouth 2 (two) times daily.  . folic acid (FOLVITE) 1 MG tablet TAKE 1 TABLET BY MOUTH DAILY. (Patient taking differently: TAKE 1MG BY MOUTH ONCE DAILY)  . glucose blood (ACCU-CHEK AVIVA PLUS) test strip USE 3 TIMES DAILY AS DIRECTED.  . hydrOXYzine (ATARAX/VISTARIL) 10  MG tablet Take 10 mg by mouth every 6 (six) hours as needed for itching or anxiety.  . metoprolol succinate (TOPROL-XL) 25 MG 24 hr tablet Take 1 tablet (25 mg total) by mouth 2 (two) times daily. (Patient taking differently: Take 25 mg by mouth daily. )  . midodrine (PROAMATINE) 5 MG tablet Take 1 tablet (5 mg total) by mouth 3 (three) times daily with meals.  . omeprazole (PRILOSEC) 20 MG capsule Take 1 capsule (20 mg total) by mouth daily.  . OXYGEN Inhale 2 L into the lungs daily.  . tiZANidine (ZANAFLEX) 4 MG tablet Take 1 tablet (4 mg total) by mouth every 8 (eight) hours as needed for muscle spasms.  . warfarin (COUMADIN) 2.5 MG tablet Take 1 tablet (2.5 mg total) by mouth daily at 6 PM. Or as directed by coumadin clinic     Allergies: Allergies as of 09/02/2017  . (No Known Allergies)   Past Medical History:  Diagnosis Date  . Anemia of chronic disease   . Asthma    said he does not know  . Chronic kidney disease (CKD), stage IV (severe) (HCC)   . Chronic systolic CHF (congestive heart failure) (HCC)    a. prior EF normal; b. Echo 10/16: Mild LVH, EF 30-35%, mitral valve bioprosthesis present without evidence of stenosis or regurgitation, severe LAE, mild RVE with mild to moderately reduced RVSF, moderate RAE  . Coronary artery disease   . Diabetes mellitus type 2 in obese (HCC)   . Diabetes mellitus without complication (HCC)   . Dyspnea   . GERD (gastroesophageal reflux disease)   . H/O tricuspid valve repair 2012  . History of echocardiogram    Echo at [facility] in [place] 4/12: mod LVH, EF 60-65%, mod LAE, tissue MVR ok, mod TR, mod pulmo HTN with RVSP 48 mmHg  . Hypertension   . Obstructive sleep apnea   . Paroxysmal atrial fibrillation (HCC)    s/p Maze procedure at time of MVR in 2011  . Pulmonary HTN (HCC)   . S/P mitral valve replacement with porcine valve 2012    Family History:  Family History  Problem Relation Age of Onset  . Heart attack Neg Hx     Social  History:  Social History   Social History  . Marital status: Single    Spouse name: N/A  . Number of children: N/A  . Years of education: N/A   Occupational History  .      on disability   Social History Main Topics  . Smoking status: Former Smoker    Packs/day: 1.00    Types: Cigarettes  . Smokeless tobacco: Former User     Comment: said he is not a regular smoker  . Alcohol use No  . Drug use: No  . Sexual activity: Not Currently   Other Topics Concern  .   Not on file   Social History Narrative  . No narrative on file    Review of Systems: A complete ROS was negative except as per HPI.   Physical Exam: Blood pressure 112/73, pulse 90, temperature (!) 97.4 F (36.3 C), temperature source Oral, resp. rate 19, height 5' 5" (1.651 m), weight 168 lb (76.2 kg), SpO2 100 %. Physical Exam  Constitutional: He is oriented to person, place, and time. He appears well-developed and well-nourished.  Eyes: Right eye exhibits no discharge. Left eye exhibits no discharge. No scleral icterus.  Neck: JVD (1cm above clavicle) present.  Cardiovascular: Normal rate and intact distal pulses.  An irregularly irregular rhythm present. Exam reveals no gallop and no friction rub.   Murmur heard.  Systolic murmur is present with a grade of 2/6  Pulmonary/Chest: Effort normal and breath sounds normal. No respiratory distress. He has no wheezes. He has no rales.  Cut off [place] O2 in the room during interview. Pt remained 98-100% on room air.    Abdominal: Soft. Bowel sounds are normal. He exhibits no distension and no mass. There is no tenderness. There is no guarding.  Musculoskeletal: He exhibits no edema or deformity.  Neurological: He is oriented to person, place, and time.  Pt was very sleepy during interview  Skin:  Pts left upper extremity AV fistula site looks clean and dry, non erythematous, is non tender.  Cellulitis appears to have resolved.        EKG: personally reviewed my  interpretation is Atrial fibrillation, normal rate, right bundle branch block, ekg unchanged when compared to 08/17/17  CXR: personally reviewed my interpretation is CHF with mild interstitial edema and small bilateral pleural effusions   Assessment & Plan by Problem: Active Problems:   Volume overload  Pt is a 56 yo male with PMH of T2DM, ESRD on MWFSat dialysis, HTN, Atrial Fibrillation, HFpEF, GERD, Mitral valve replacement and Tricuspid valve repair, presents from Dialysis unit where pt became short of breath and reportedly required 4-5L [place], pt on 2L at home.      Volume overload: Patient with signs and symptoms consistent with volume overload, mild interstitial edema on chest x-ray, JVD on exam.  However, this seems to be chronic and patient had good O2 saturations and was able to speak in full sentences on room air.  Pt on 2L at home.  I expect that patient is very preload dependent given his cardiac history and with his hypotension he will likely have difficulty with these dialysis sessions.    -Nephrology aware that patient hospital and is scheduled a dialysis session for this evening to try and remove more fluid. -[place] oxygen as needed -Continuous cardiac monitoring.  Chest Pain: pain did not occur during dialysis, was first noticed in the ED.  pain is reproducible on palpation, is positional.  Had a cardiac arrest a few weeks ago where CPR was performed. Unlikely ACS, no new EKG findings to suggest ischemia, troponin negative.  -continuous cardiac monitoring  Atrial Fibrillation: chronic, pt on coumadin and metoprolol  -INR 3.44 in ED, will hold coumadin -ordered coumadin per pharmacy consult   Dispo: Admit patient to Observation with expected length of stay less than 2 midnights.  Signed: ,  B, MD 09/02/2017, 7:47 PM   

## 2017-09-02 NOTE — ED Notes (Signed)
Attempted report 

## 2017-09-02 NOTE — Progress Notes (Signed)
HD tx initiated via HD cath w/o problem, pull/push/flush equally w/o problem, VSS, will cont to monitor while on HD tx 

## 2017-09-03 ENCOUNTER — Encounter (HOSPITAL_COMMUNITY): Payer: Self-pay | Admitting: Cardiology

## 2017-09-03 ENCOUNTER — Ambulatory Visit: Payer: Medicare Other | Admitting: Physician Assistant

## 2017-09-03 DIAGNOSIS — R0602 Shortness of breath: Secondary | ICD-10-CM | POA: Diagnosis not present

## 2017-09-03 DIAGNOSIS — I482 Chronic atrial fibrillation: Secondary | ICD-10-CM | POA: Diagnosis not present

## 2017-09-03 DIAGNOSIS — I4891 Unspecified atrial fibrillation: Secondary | ICD-10-CM

## 2017-09-03 DIAGNOSIS — K219 Gastro-esophageal reflux disease without esophagitis: Secondary | ICD-10-CM | POA: Diagnosis present

## 2017-09-03 DIAGNOSIS — R0789 Other chest pain: Secondary | ICD-10-CM | POA: Diagnosis not present

## 2017-09-03 DIAGNOSIS — N186 End stage renal disease: Secondary | ICD-10-CM | POA: Diagnosis not present

## 2017-09-03 DIAGNOSIS — I5030 Unspecified diastolic (congestive) heart failure: Secondary | ICD-10-CM | POA: Diagnosis not present

## 2017-09-03 DIAGNOSIS — Z87891 Personal history of nicotine dependence: Secondary | ICD-10-CM

## 2017-09-03 DIAGNOSIS — Z992 Dependence on renal dialysis: Secondary | ICD-10-CM | POA: Diagnosis not present

## 2017-09-03 DIAGNOSIS — R011 Cardiac murmur, unspecified: Secondary | ICD-10-CM | POA: Diagnosis not present

## 2017-09-03 DIAGNOSIS — Z9981 Dependence on supplemental oxygen: Secondary | ICD-10-CM | POA: Diagnosis not present

## 2017-09-03 DIAGNOSIS — N2581 Secondary hyperparathyroidism of renal origin: Secondary | ICD-10-CM | POA: Diagnosis not present

## 2017-09-03 DIAGNOSIS — G4733 Obstructive sleep apnea (adult) (pediatric): Secondary | ICD-10-CM | POA: Diagnosis present

## 2017-09-03 DIAGNOSIS — Z23 Encounter for immunization: Secondary | ICD-10-CM | POA: Diagnosis not present

## 2017-09-03 DIAGNOSIS — E8770 Fluid overload, unspecified: Principal | ICD-10-CM

## 2017-09-03 DIAGNOSIS — Z952 Presence of prosthetic heart valve: Secondary | ICD-10-CM

## 2017-09-03 DIAGNOSIS — R079 Chest pain, unspecified: Secondary | ICD-10-CM | POA: Diagnosis not present

## 2017-09-03 DIAGNOSIS — E1122 Type 2 diabetes mellitus with diabetic chronic kidney disease: Secondary | ICD-10-CM

## 2017-09-03 DIAGNOSIS — M546 Pain in thoracic spine: Secondary | ICD-10-CM | POA: Diagnosis not present

## 2017-09-03 DIAGNOSIS — I5042 Chronic combined systolic (congestive) and diastolic (congestive) heart failure: Secondary | ICD-10-CM | POA: Diagnosis not present

## 2017-09-03 DIAGNOSIS — Z7901 Long term (current) use of anticoagulants: Secondary | ICD-10-CM

## 2017-09-03 DIAGNOSIS — R000 Tachycardia, unspecified: Secondary | ICD-10-CM | POA: Diagnosis present

## 2017-09-03 DIAGNOSIS — I878 Other specified disorders of veins: Secondary | ICD-10-CM

## 2017-09-03 DIAGNOSIS — J9611 Chronic respiratory failure with hypoxia: Secondary | ICD-10-CM

## 2017-09-03 DIAGNOSIS — Z79899 Other long term (current) drug therapy: Secondary | ICD-10-CM

## 2017-09-03 DIAGNOSIS — I953 Hypotension of hemodialysis: Secondary | ICD-10-CM | POA: Diagnosis present

## 2017-09-03 DIAGNOSIS — F419 Anxiety disorder, unspecified: Secondary | ICD-10-CM | POA: Diagnosis present

## 2017-09-03 DIAGNOSIS — I959 Hypotension, unspecified: Secondary | ICD-10-CM | POA: Diagnosis not present

## 2017-09-03 DIAGNOSIS — E785 Hyperlipidemia, unspecified: Secondary | ICD-10-CM | POA: Diagnosis present

## 2017-09-03 DIAGNOSIS — I9589 Other hypotension: Secondary | ICD-10-CM | POA: Diagnosis not present

## 2017-09-03 DIAGNOSIS — I4510 Unspecified right bundle-branch block: Secondary | ICD-10-CM | POA: Diagnosis not present

## 2017-09-03 DIAGNOSIS — I132 Hypertensive heart and chronic kidney disease with heart failure and with stage 5 chronic kidney disease, or end stage renal disease: Secondary | ICD-10-CM | POA: Diagnosis not present

## 2017-09-03 DIAGNOSIS — I071 Rheumatic tricuspid insufficiency: Secondary | ICD-10-CM | POA: Diagnosis not present

## 2017-09-03 DIAGNOSIS — I272 Pulmonary hypertension, unspecified: Secondary | ICD-10-CM | POA: Diagnosis not present

## 2017-09-03 DIAGNOSIS — D638 Anemia in other chronic diseases classified elsewhere: Secondary | ICD-10-CM | POA: Diagnosis present

## 2017-09-03 LAB — RENAL FUNCTION PANEL
ALBUMIN: 3 g/dL — AB (ref 3.5–5.0)
ALBUMIN: 2.8 g/dL — AB (ref 3.5–5.0)
Anion gap: 12 (ref 5–15)
Anion gap: 11 (ref 5–15)
BUN: 12 mg/dL (ref 6–20)
BUN: 11 mg/dL (ref 6–20)
CALCIUM: 8.6 mg/dL — AB (ref 8.9–10.3)
CALCIUM: 8.5 mg/dL — AB (ref 8.9–10.3)
CO2: 29 mmol/L (ref 22–32)
CO2: 30 mmol/L (ref 22–32)
CREATININE: 3.12 mg/dL — AB (ref 0.61–1.24)
CREATININE: 3.2 mg/dL — AB (ref 0.61–1.24)
Chloride: 96 mmol/L — ABNORMAL LOW (ref 101–111)
Chloride: 96 mmol/L — ABNORMAL LOW (ref 101–111)
GFR calc Af Amer: 24 mL/min — ABNORMAL LOW (ref >60)
GFR calc Af Amer: 23 mL/min — ABNORMAL LOW (ref >60)
GFR, EST NON AFRICAN AMERICAN: 21 mL/min — AB (ref >60)
GFR, EST NON AFRICAN AMERICAN: 20 mL/min — AB (ref >60)
GLUCOSE: 72 mg/dL (ref 65–99)
GLUCOSE: 77 mg/dL (ref 65–99)
PHOSPHORUS: 2.7 mg/dL (ref 2.5–4.6)
PHOSPHORUS: 2.7 mg/dL (ref 2.5–4.6)
POTASSIUM: 3.8 mmol/L (ref 3.5–5.1)
Potassium: 3.7 mmol/L (ref 3.5–5.1)
SODIUM: 137 mmol/L (ref 135–145)
SODIUM: 137 mmol/L (ref 135–145)

## 2017-09-03 LAB — CBC
HCT: 34.5 % — ABNORMAL LOW (ref 39.0–52.0)
Hemoglobin: 11 g/dL — ABNORMAL LOW (ref 13.0–17.0)
MCH: 26.4 pg (ref 26.0–34.0)
MCHC: 31.9 g/dL (ref 30.0–36.0)
MCV: 82.9 fL (ref 78.0–100.0)
PLATELETS: 57 10*3/uL — AB (ref 150–400)
RBC: 4.16 MIL/uL — ABNORMAL LOW (ref 4.22–5.81)
RDW: 18.8 % — AB (ref 11.5–15.5)
WBC: 6.7 10*3/uL (ref 4.0–10.5)

## 2017-09-03 LAB — GLUCOSE, CAPILLARY
GLUCOSE-CAPILLARY: 64 mg/dL — AB (ref 65–99)
Glucose-Capillary: 95 mg/dL (ref 65–99)
Glucose-Capillary: 95 mg/dL (ref 65–99)
Glucose-Capillary: 95 mg/dL (ref 65–99)

## 2017-09-03 LAB — PROTIME-INR
INR: 2.74
Prothrombin Time: 28.8 seconds — ABNORMAL HIGH (ref 11.4–15.2)

## 2017-09-03 MED ORDER — INSULIN ASPART 100 UNIT/ML ~~LOC~~ SOLN: 0.0000 [IU] | Freq: Three times a day (TID) | SUBCUTANEOUS | Status: DC

## 2017-09-03 MED ORDER — INFLUENZA VAC SPLIT QUAD 0.5 ML IM SUSY
0.5000 mL | PREFILLED_SYRINGE | INTRAMUSCULAR | Status: AC
  Administered 2017-09-04: 0.5 mL via INTRAMUSCULAR
  Filled 2017-09-03: qty 0.5

## 2017-09-03 MED ORDER — FOLIC ACID 1 MG PO TABS
1.0000 mg | ORAL_TABLET | Freq: Every day | ORAL | Status: DC
  Administered 2017-09-03 – 2017-09-06 (×3): 1 mg via ORAL
  Filled 2017-09-03 (×3): qty 1

## 2017-09-03 MED ORDER — PANTOPRAZOLE SODIUM 40 MG PO TBEC
40.0000 mg | DELAYED_RELEASE_TABLET | Freq: Every day | ORAL | Status: DC
  Administered 2017-09-03 – 2017-09-06 (×3): 40 mg via ORAL
  Filled 2017-09-03 (×3): qty 1

## 2017-09-03 MED ORDER — ACETAMINOPHEN 325 MG PO TABS
650.0000 mg | ORAL_TABLET | Freq: Four times a day (QID) | ORAL | Status: DC
  Administered 2017-09-03 – 2017-09-06 (×12): 650 mg via ORAL
  Filled 2017-09-03 (×12): qty 2

## 2017-09-03 MED ORDER — TIZANIDINE HCL 2 MG PO TABS: 4.0000 mg | ORAL_TABLET | Freq: Three times a day (TID) | ORAL | Status: DC | PRN

## 2017-09-03 MED ORDER — PRO-STAT SUGAR FREE PO LIQD
30.0000 mL | Freq: Two times a day (BID) | ORAL | Status: DC
  Administered 2017-09-03 – 2017-09-04 (×3): 30 mL via ORAL
  Administered 2017-09-05: 23:00:00 via ORAL
  Administered 2017-09-05 – 2017-09-06 (×2): 30 mL via ORAL
  Filled 2017-09-03 (×6): qty 30

## 2017-09-03 MED ORDER — CALCIUM ACETATE (PHOS BINDER) 667 MG PO CAPS: 667.0000 mg | ORAL_CAPSULE | ORAL | Status: DC | PRN

## 2017-09-03 MED ORDER — DARBEPOETIN ALFA 200 MCG/0.4ML IJ SOSY: 200.0000 ug | PREFILLED_SYRINGE | INTRAMUSCULAR | Status: DC

## 2017-09-03 MED ORDER — DARBEPOETIN ALFA 200 MCG/0.4ML IJ SOSY
200.0000 ug | PREFILLED_SYRINGE | INTRAMUSCULAR | Status: DC
  Administered 2017-09-05: 200 ug via INTRAVENOUS
  Filled 2017-09-03: qty 0.4

## 2017-09-03 MED ORDER — HYDROXYZINE HCL 10 MG PO TABS
10.0000 mg | ORAL_TABLET | Freq: Four times a day (QID) | ORAL | Status: DC | PRN
Start: 2017-09-03 — End: 2017-09-06
  Administered 2017-09-05: 10 mg via ORAL
  Filled 2017-09-03: qty 1

## 2017-09-03 MED ORDER — DOCUSATE SODIUM 100 MG PO CAPS
100.0000 mg | ORAL_CAPSULE | Freq: Two times a day (BID) | ORAL | Status: DC
  Administered 2017-09-03 – 2017-09-05 (×5): 100 mg via ORAL
  Filled 2017-09-03 (×7): qty 1

## 2017-09-03 MED ORDER — ALLOPURINOL 300 MG PO TABS
150.0000 mg | ORAL_TABLET | Freq: Every day | ORAL | Status: DC
  Administered 2017-09-03 – 2017-09-06 (×3): 150 mg via ORAL
  Filled 2017-09-03 (×3): qty 1

## 2017-09-03 MED ORDER — MIDODRINE HCL 5 MG PO TABS
10.0000 mg | ORAL_TABLET | Freq: Three times a day (TID) | ORAL | Status: DC
  Administered 2017-09-03 – 2017-09-06 (×8): 10 mg via ORAL
  Filled 2017-09-03 (×8): qty 2

## 2017-09-03 MED ORDER — MIDODRINE HCL 5 MG PO TABS
5.0000 mg | ORAL_TABLET | Freq: Three times a day (TID) | ORAL | Status: DC
  Administered 2017-09-03 (×2): 5 mg via ORAL
  Filled 2017-09-03 (×2): qty 1

## 2017-09-03 MED ORDER — METOPROLOL SUCCINATE ER 25 MG PO TB24
25.0000 mg | ORAL_TABLET | Freq: Two times a day (BID) | ORAL | Status: DC
  Administered 2017-09-03 – 2017-09-06 (×5): 25 mg via ORAL
  Filled 2017-09-03 (×5): qty 1

## 2017-09-03 MED ORDER — ACETAMINOPHEN-CODEINE #3 300-30 MG PO TABS
1.0000 | ORAL_TABLET | Freq: Two times a day (BID) | ORAL | Status: DC | PRN
  Administered 2017-09-04 – 2017-09-06 (×2): 1 via ORAL
  Filled 2017-09-03 (×2): qty 1

## 2017-09-03 MED ORDER — WARFARIN - PHARMACIST DOSING INPATIENT
Freq: Every day | Status: DC
  Administered 2017-09-03: 18:00:00

## 2017-09-03 MED ORDER — ATORVASTATIN CALCIUM 20 MG PO TABS
20.0000 mg | ORAL_TABLET | Freq: Every day | ORAL | Status: DC
  Administered 2017-09-03 – 2017-09-06 (×3): 20 mg via ORAL
  Filled 2017-09-03 (×3): qty 1

## 2017-09-03 MED ORDER — CALCIUM ACETATE (PHOS BINDER) 667 MG PO CAPS
2001.0000 mg | ORAL_CAPSULE | Freq: Three times a day (TID) | ORAL | Status: DC
  Administered 2017-09-03 – 2017-09-06 (×9): 2001 mg via ORAL
  Filled 2017-09-03 (×10): qty 3

## 2017-09-03 MED ORDER — WARFARIN SODIUM 1 MG PO TABS
1.0000 mg | ORAL_TABLET | Freq: Once | ORAL | Status: AC
  Administered 2017-09-03: 1 mg via ORAL
  Filled 2017-09-03: qty 1

## 2017-09-03 NOTE — Consult Note (Signed)
   [facility] CM Inpatient Consult   09/03/2017  Ana Markus May 16, 1961 [id number]  Patient is currently active with [facility] for chronic disease management services.  Patient has been engaged by a [employer] and CSW. Our community based plan of care has focused on disease management and community resource support.  Patient will receive a post discharge transition of care call and will be evaluated for monthly home visits for assessments and disease process education. Currently working with the patient on getting his [name] application process completed scheduled for 09/10/17.  Came by to see patient, he is soundly asleep. Will continue to follow for progress and disposition needs.  Patient's chart reviewed and will make Inpatient Case Manager aware that [facility] following. Of note, [facility] Care Management services does not replace or interfere with any services that are needed or arranged by inpatient case management or social work.  For additional questions or referrals please contact:   Natividad Brood, RN BSN [facility] Liaison  [phone] business mobile phone Toll free office [phone]   Notified Inpatient RNCM.

## 2017-09-03 NOTE — Consult Note (Signed)
[facility] Renal Consultation Note    Indication for Consultation:  Management of ESRD/hemodialysis; anemia, hypertension/volume and secondary hyperparathyroidism  ZPH:XTAV, Enobong, MD  HPI: Michael [name] is a 56 y.o. male. 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  Past medical history significant for chronic systolic HF, CAD, DMT2, PAF, mitral valve/tricuspid valve repair, A. Fib s/p MAZE procedure on coumadin.   Patient was recently discharged from [facility] due to episode of cardiac arrest at his outpatient dialysis center and volume overload.  He tolerated 3 HD treatments at outpatient center with minimal to no fluid removal since discharge.   Patient presented to the ED 1 day ago following an episode of at his outpatient clinic. Per the charge nurse about 1hr into treatment be became unresponsive, with apneic breathing with a brief period of no pulse.  He was taken out of UF, given 251m bolus and he regained consciousness, sat upright, his lips were blue and he had tachypnic breathing.  He became hypotensive while sitting upright, but could not tolerated laying back due to difficultly breathing. EMS arrived soon after.   In the ED patient was noted to have interstitial edema on CXR, and was not in distress. Labs were within normal limits.   Seen and examined at bedside. Reports feeling better today.  Reports having SOB and tachycardia at HD yesterday.  Does not recall a LOC episode. Denies current SOB, CP, abdominal pain, cough, fever, chills, dizziness and edema.   Past Medical History:  Diagnosis Date  . Anemia of chronic disease   . Asthma    said he does not know  . Chronic kidney disease (CKD), stage IV (severe) ([place]   . Chronic systolic CHF (congestive heart failure) (HCC)    a. prior EF normal; b. Echo 10/16: Mild LVH, EF 30-35%, mitral valve bioprosthesis present without evidence of stenosis or  regurgitation, severe LAE, mild RVE with mild to moderately reduced RVSF, moderate RAE  . Coronary artery disease   . Diabetes mellitus type 2 in obese ([place]   . Diabetes mellitus without complication ([place]   . Dyspnea   . GERD (gastroesophageal reflux disease)   . H/O tricuspid valve repair 2012  . History of echocardiogram    [facility] mod LVH, EF 60-65%, mod LAE, tissue MVR ok, mod TR, mod pulmo HTN with RVSP 48 mmHg  . Hypertension   . Obstructive sleep apnea   . Paroxysmal atrial fibrillation (HCC)    s/p Maze procedure at time of MVR in 2011  . Pulmonary HTN ([place]   . S/P mitral valve replacement with porcine valve 2012   Past Surgical History:  Procedure Laterality Date  . AV FISTULA PLACEMENT Left 07/13/2017   Procedure: LEFT ARM ARTERIOVENOUS (AV) FISTULA CREATION;  Surgeon: CWaynetta Sandy MD;  [place]  Service: Vascular;  Laterality: Left;  . CARDIAC SURGERY    . CARDIAC VALVE REPLACEMENT  2011  . CARDIOVERSION N/A 07/09/2017   Procedure: CARDIOVERSION;  Surgeon: MLarey Dresser MD;  [facility]  Service: Cardiovascular;  Laterality: N/A;  . COLONOSCOPY N/A 07/16/2017   Procedure: COLONOSCOPY;  Surgeon: PJerene Bears MD;  [place]  Service: Gastroenterology;  Laterality: N/A;  . ESOPHAGOGASTRODUODENOSCOPY N/A 07/16/2017   Procedure: ESOPHAGOGASTRODUODENOSCOPY (EGD);  Surgeon: PJerene Bears MD;  [facility];  Service: Gastroenterology;  Laterality: N/A;  . INSERTION OF DIALYSIS CATHETER Right 07/13/2017  Procedure: INSERTION OF DIALYSIS CATHETER RIGHT INTERNAL JUGULAR;  Surgeon: Waynetta Sandy, MD;  Location: [place];  Service: Vascular;  Laterality: Right;  . RIGHT HEART CATH N/A 06/29/2017   Procedure: RIGHT HEART CATH;  Surgeon: Larey Dresser, MD;  Location: [place] CV LAB;  Service: Cardiovascular;  Laterality: N/A;  . RIGHT/LEFT HEART CATH AND CORONARY ANGIOGRAPHY N/A 08/19/2017    Procedure: RIGHT/LEFT HEART CATH AND CORONARY ANGIOGRAPHY;  Surgeon: Leonie Man, MD;  Location: [place] CV LAB;  Service: Cardiovascular;  Laterality: N/A;  . TEE WITHOUT CARDIOVERSION N/A 07/09/2017   Procedure: TRANSESOPHAGEAL ECHOCARDIOGRAM (TEE);  Surgeon: Larey Dresser, MD;  [facility];  Service: Cardiovascular;  Laterality: N/A;   Family History  Problem Relation Age of Onset  . Heart attack Neg Hx    Social History:  reports that he has quit smoking. His smoking use included Cigarettes. He smoked 1.00 pack per day. He has quit using smokeless tobacco. He reports that he does not drink alcohol or use drugs. No Known Allergies Prior to Admission medications   Medication Sig Start Date End Date Taking? Authorizing Provider  ACCU-CHEK SOFTCLIX LANCETS lancets USE AS INSTRUCTED 3 TIMES DAILY 08/04/17  Yes [name], MD  acetaminophen (TYLENOL) 325 MG tablet Take 650 mg by mouth every 6 (six) hours.   Yes [provider]  acetaminophen-codeine (TYLENOL #3) 300-30 MG tablet Take 1 tablet by mouth every 12 (twelve) hours as needed for moderate pain. Patient taking differently: Take 1 tablet by mouth 2 (two) times daily.  08/04/17  Yes [name], MD  allopurinol (ZYLOPRIM) 300 MG tablet Take 0.5 tablets (150 mg total) by mouth daily. 07/28/17  Yes Shirley Friar, PA-C  atorvastatin (LIPITOR) 20 MG tablet Take 1 tablet (20 mg total) by mouth daily. 07/28/17  Yes Shirley Friar, PA-C  Blood Glucose Monitoring Suppl (ACCU-CHEK AVIVA PLUS) w/Device KIT USE AS INSTRUCTED 3 TIMES DAILY. E11.9 08/31/17  Yes [name], MD  calcium acetate (PHOSLO) 667 MG capsule Take 1 capsule (667 mg total) by mouth 3 (three) times daily with meals. Patient taking differently: Take 667-2,001 mg by mouth See admin instructions. Pt takes 2,050m by mouth three times daily and 6667mtwice daily with snacks 07/27/17  Yes Tillery, MiSatira MccallumPA-C  cephALEXin  (KEFLEX) 500 MG capsule Take 500 mg by mouth at bedtime. 08/25/17  Yes [provider]  colchicine 0.6 MG tablet Take 1.2 mg by mouth daily.  06/02/17  Yes [provider]  Darbepoetin Alfa (ARANESP) 200 MCG/0.4ML SOSY injection Inject 0.4 mLs (200 mcg total) into the skin every Saturday at 6 PM. 08/01/17  Yes Tillery, MiSatira MccallumPA-C  diclofenac sodium (VOLTAREN) 1 % GEL Apply 4 g topically 4 (four) times daily.   Yes [provider]  docusate sodium (COLACE) 100 MG capsule Take 1 capsule (100 mg total) by mouth 2 (two) times daily. 07/27/17  Yes TiShirley FriarPA-C  folic acid (FOLVITE) 1 MG tablet TAKE 1 TABLET BY MOUTH DAILY. Patient taking differently: TAKE 1MG BY MOUTH ONCE DAILY 04/20/17  Yes Amao, Enobong, MD  glucose blood (ACCU-CHEK AVIVA PLUS) test strip USE 3 TIMES DAILY AS DIRECTED. 08/04/17  Yes AmArnoldo MoraleMD  hydrOXYzine (ATARAX/VISTARIL) 10 MG tablet Take 10 mg by mouth every 6 (six) hours as needed for itching or anxiety. 07/28/17  Yes [provider]  metoprolol succinate (TOPROL-XL) 25 MG 24 hr tablet Take 1 tablet (25 mg total) by mouth  2 (two) times daily. Patient taking differently: Take 25 mg by mouth daily.  07/27/17  Yes Shirley Friar, PA-C  midodrine (PROAMATINE) 5 MG tablet Take 1 tablet (5 mg total) by mouth 3 (three) times daily with meals. 07/27/17  Yes Shirley Friar, PA-C  omeprazole (PRILOSEC) 20 MG capsule Take 1 capsule (20 mg total) by mouth daily. 03/03/17  Yes [name], MD  OXYGEN Inhale 2 L into the lungs daily.   Yes [provider]  tiZANidine (ZANAFLEX) 4 MG tablet Take 1 tablet (4 mg total) by mouth every 8 (eight) hours as needed for muscle spasms. 08/04/17  Yes [name], MD  warfarin (COUMADIN) 2.5 MG tablet Take 1 tablet (2.5 mg total) by mouth daily at 6 PM. Or as directed by coumadin clinic 07/27/17  Yes Shirley Friar, PA-C   Current Facility-Administered  Medications  Medication Dose Route Frequency Provider Last Rate Last Dose  . 0.9 %  sodium chloride infusion  100 mL Intravenous PRN Penninger, Ria Comment, PA      . 0.9 %  sodium chloride infusion  100 mL Intravenous PRN Penninger, Ria Comment, PA      . acetaminophen (TYLENOL) tablet 650 mg  650 mg Oral Q6H Rathore, Wandra Feinstein, MD      . acetaminophen-codeine (TYLENOL #3) 300-30 MG per tablet 1 tablet  1 tablet Oral Q12H PRN Shela Leff, MD      . allopurinol (ZYLOPRIM) tablet 150 mg  150 mg Oral Daily Shela Leff, MD   150 mg at 09/03/17 1025  . alteplase (CATHFLO ACTIVASE) injection 2 mg  2 mg Intracatheter Once PRN Penninger, Ria Comment, PA      . atorvastatin (LIPITOR) tablet 20 mg  20 mg Oral Daily Shela Leff, MD   20 mg at 09/03/17 1024  . calcium acetate (PHOSLO) capsule 2,001 mg  2,001 mg Oral TID WC Shela Leff, MD   2,001 mg at 09/03/17 0786  . calcium acetate (PHOSLO) capsule 667 mg  667 mg Oral PRN Lucious Groves, DO      . [START ON 09/05/2017] Darbepoetin Alfa (ARANESP) injection 200 mcg  200 mcg Subcutaneous Q Sat-1800 Shela Leff, MD      . docusate sodium (COLACE) capsule 100 mg  100 mg Oral BID Shela Leff, MD   100 mg at 75/44/92 0100  . folic acid (FOLVITE) tablet 1 mg  1 mg Oral Daily Shela Leff, MD   1 mg at 09/03/17 1026  . heparin injection 1,000 Units  1,000 Units Dialysis PRN Penninger, Ria Comment, PA   1,000 Units at 09/03/17 0038  . heparin injection 1,500 Units  20 Units/kg Dialysis PRN Penninger, Ria Comment, PA   1,500 Units at 09/02/17 2155  . hydrOXYzine (ATARAX/VISTARIL) tablet 10 mg  10 mg Oral Q6H PRN Shela Leff, MD      . Derrill Memo ON 09/04/2017] Influenza vac split quadrivalent PF (FLUARIX) injection 0.5 mL  0.5 mL Intramuscular Tomorrow-1000 Hoffman, Erik C, DO      . insulin aspart (novoLOG) injection 0-9 Units  0-9 Units Subcutaneous TID WC Shela Leff, MD      . lidocaine (PF) (XYLOCAINE) 1 % injection 5 mL   5 mL Intradermal PRN Penninger, Ria Comment, PA      . lidocaine-prilocaine (EMLA) cream 1 application  1 application Topical PRN Penninger, Ria Comment, PA      . metoprolol succinate (TOPROL-XL) 24 hr tablet 25 mg  25 mg Oral BID Shela Leff, MD   25 mg at 09/03/17 1025  .  midodrine (PROAMATINE) tablet 5 mg  5 mg Oral TID WC Shela Leff, MD   5 mg at 09/03/17 0833  . pantoprazole (PROTONIX) EC tablet 40 mg  40 mg Oral Daily Shela Leff, MD   40 mg at 09/03/17 1024  . pentafluoroprop-tetrafluoroeth (GEBAUERS) aerosol 1 application  1 application Topical PRN Penninger, Ria Comment, PA      . tiZANidine (ZANAFLEX) tablet 4 mg  4 mg Oral Q8H PRN Shela Leff, MD       Labs: Basic Metabolic Panel:  Recent Labs Lab 09/02/17 2214 09/03/17 0320 09/03/17 0508  NA 136 137 137  K 3.6 3.7 3.8  CL 97* 96* 96*  CO2 '29 29 30  ' GLUCOSE 108* 72 77  BUN '18 12 11  ' CREATININE 4.08* 3.12* 3.20*  CALCIUM 8.7* 8.6* 8.5*  PHOS 3.4 2.7 2.7   Liver Function Tests:  Recent Labs Lab 09/02/17 2214 09/03/17 0320 09/03/17 0508  ALBUMIN 2.9* 3.0* 2.8*   CBC:  Recent Labs Lab 09/02/17 1424 09/02/17 2213 09/03/17 0320  WBC 6.3 5.1 6.7  HGB 10.5* 10.2* 11.0*  HCT 33.0* 31.4* 34.5*  MCV 82.5 82.2 82.9  PLT 106* 117* 57*   CBG:  Recent Labs Lab 09/03/17 0826  GLUCAP 64*   Studies/Results: Dg Chest 2 View  Result Date: 09/02/2017 CLINICAL DATA:  Shortness of breath, atrial fibrillation, back pain. Dialysis dependent renal failure. The patient was unable to fill with initiates treatment today. EXAM: CHEST  2 VIEW COMPARISON:  PA and lateral chest x-ray of August 20, 2017 FINDINGS: The lungs are reasonably well inflated. There are bilateral pleural effusions greatest on the right. The interstitial markings are increased. The pulmonary vascularity is engorged. The cardiac silhouette is enlarged. The dialysis catheter tip projects over the midportion of the SVC. There is  calcification in the wall of the aortic arch. IMPRESSION: CHF with mild interstitial edema and small bilateral pleural effusions. Thoracic aortic atherosclerosis. Electronically Signed   By: David  Martinique M.D.   On: 09/02/2017 15:02    ROS: All others negative except those listed in HPI.   Physical Exam: Vitals:   09/03/17 0033 09/03/17 0040 09/03/17 0143 09/03/17 0900  BP: 105/63 112/64 91/61 (!) 103/58  Pulse: 83 84 96 81  Resp: '10 18 19 18  ' Temp:  97.7 F (36.5 C) 98.5 F (36.9 C) 98.4 F (36.9 C)  TempSrc:  Oral Oral Oral  SpO2: 90% 91% 100% 100%  Weight:  75 kg (165 lb 5.5 oz) 75.3 kg (166 lb 0.1 oz)   Height:   '5\' 5"'  (1.651 m)      General: WDWN, NAD Head: NCAT, sclera not icteric, MMM Neck: Supple. No lymphadenopathy. +JVD Lungs: CTA bilaterally with decreased breath sounds on RLL.  Heart: RRR. +2/6 murmur best heard at 5th intercostal space near L sternal border, +heave Abdomen: soft, nontender, no guarding, no rebound tenderness.  Lower extremities:no edema, ischemic changes, or open wounds  Neuro: A&Ox3. Moves all extremities spontaneously. Psych:  Responds to questions appropriately with a normal affect. Dialysis Access: L IJ TDC  Dialysis Orders:  MWFS - [employer]   4hrs, BFR 300, DFR 800,  EDW 73kg, 2K/ 2.25Ca,   Access: L IJ TDC  Heparin 4200 Unit bolus  Mircera 240mg IV q2wks Venofer 59mIV qwk  Assessment/Plan: 1. Dyspnea 2/2 complication of ESRD - Improved, although did not receive a full treatment. On O2 prn at home.   2.  ESRD -  HD this am, unable to  tolerate due to intermittent tachycardia.  Net Vol removal 1.4L, total time ~2hrs  Patient is failing outpatient therapy.  Unable to tolerate volume removal. May need to consider other options and another Palliative Care Consult. HD orders written for tomorrow. If continues on outpatient HD, needs to be d/c on 4K bath to avoid hypokalemia in hopes to prevent induction of A fib. 3.   Hypotension/volume  - BP stable on midodrine daily. Clinically volume appears ok, 1.7kg over EDW today. Fluid restrictions and titrate down volume as tolerated.  4.  Anemia  - Hgb 11.0, no ESA indicated. 5.  Secondary Hyperparathyroidism -  Ca and Phos in goal. Hold binder. 6.  Nutrition - Alb 2.8. Renavite, Prostat and renal/carb modified diet.  7. DM - per primary 8. A Fib - currently rate controlled. Cardio consulted. Per primary 9. CHF - EF 50-55% - R sided HF, moderate pulmonary HTN. Per primary.   Michael Mow, PA-C [facility] Pager: [phone] 09/03/2017, 10:41 AM   Pt seen, examined and agree w A/P as above. ESRD pt with known severe R sided HF , presenting from dialysis where he had SOB and tachycardia (afib related) causing shortening of 4h treatment to 2 hours.  Here he is stable, up 2-3 kg over dry and +IS edema on CXR.  Had HD here overnight w/ 1.5 L off. No sig distress or gross volume excess on exam, prior LE edema has resolved entirely. Will need HD tomorrow and will repeat CXR (+CHF yest afternoon) after tomorrow's HD.  Kelly Splinter MD [employer] pager [phone]   09/03/2017, 4:18 PM

## 2017-09-03 NOTE — Progress Notes (Signed)
HD tx ended 1 hr 57 min early d/t pt's HR kept spiking up to the 130-154s afib, pt was asymptomatic throughout it but it would spike and sustain about 30 secs and come back down to 103-110 and a few mins later spike again. Pt had an earlier episode of anxiety that required an order from MD to help pt as he was diaphoretic and anxious and wanting off the machine after 30 mins. Pt received 1x dose of Morphine 83m IVP and it was effective. UF goal was not met, blood rinsed back. Dr. PPosey Prontowas made aware of HR issue and that I was rinsing back and he returned page stating it was ok to end tx early for this pt. After tx was ended, pt's HR didn't spike up again and held out in the 103-113 afib range. Report called to CMargie Ege RN

## 2017-09-03 NOTE — Consult Note (Signed)
Cardiology Consultation:   Patient ID: Michael [name]; [id number]; 07/18/1961   Admit date: 09/02/2017 Date of Consult: 09/03/2017  Primary Care Provider: [name], MD Primary Cardiologist: Dr. Harrington Challenger Primary Electrophysiologist:  NA   Patient Profile:   Michael [name] is a 56 y.o. male with a hx of MV disorder, MVR with bioprosthetic MV 2012 at [facility] in [place], and TR valve repair, CAD last cath 08/18/17, ESRD on HD, OSA, DM, HTN HLD CHF pulmonary HTN, anemia, recent episodes of unresponsiveness related to hypotension  now who is being seen today for the evaluation of atrial fib at the request of Dr. Evette Doffing after admit 09/02/17 after acute SOB with dialysis.    History of Present Illness:   Michael [name] has a hx of MV disorder, MVR with bioprosthetic MV 2012 at [facility] in [place], and TR valve repair along with Maze procedure, CAD last cath 08/18/17, ESRD on HD MWFS, OSA, DM, HTN HLD CHF pulmonary HTN, anemia, recent episodes of unresponsiveness related to hypotension once at dialysis with CPR 2 min suspected PEA no monitor, but AED said no shock, and then again in dialysis here at [facility].  With lexiscan he developed hypotension and became less responsive but continued in a fib and once BP improved pt became more alert.    He has a fib and he underwent DCCV with TEE 07/09/17 (had been in a fib for 1 year prior to this)  but went back into A fib.  Has been a fib since that time.  Amiodarone has been stopped due to permanent a fib. I have not seen any conversions to SR.   He has hypotension and is on midodrine.      Last cath 08/19/17 with RPDA lesion 100% stenosed, unable to see occlusion site,with left to right collaterals, not a PCI option and likely not acute, otherwise minimal CAD.  Mod Pulmonary HTN.   Echo with EF 50-55% MV with no significant stenosis, no significant regurg.  Bioprosthetic MV.  RA mod dilated and LA severely dilated.  TV repair does not appear to fully coapt.   Severe regurg.  RV-RA gradient 48 mmHg , trivial pericardial effusion. Mild aortic insuff.    Palliative care was consulted in Oct but pt would like to make it until May to see daughter graduate.    Pt admitted 09/02/17 after developing acute SOB with dialysis.  Could not complete dialysis and was sent to ER.    EKG I personally reviewed on admit a fib with RBBB and no changes even improved from 08/17/17.  Rate is well controlled.  Telemetry:  Telemetry was personally reviewed and demonstrates:  A fib rate controlled.    Troponin is 0.0  INR is 3.44 and 2.74 on warfarin. CR 3.20, K+ 3.8 Hgb 11, plts 57   Currently pain free and no SOB.   No lightheadedness or syncope at home.  no SOB at home.    Past Medical History:  Diagnosis Date  . Anemia of chronic disease   . Asthma    said he does not know  . Chronic kidney disease (CKD), stage IV (severe) ([place])   . Chronic systolic CHF (congestive heart failure) (HCC)    a. prior EF normal; b. Echo 10/16: Mild LVH, EF 30-35%, mitral valve bioprosthesis present without evidence of stenosis or regurgitation, severe LAE, mild RVE with mild to moderately reduced RVSF, moderate RAE  . Coronary artery disease   . Diabetes mellitus type 2 in obese ([place])   .  Diabetes mellitus without complication ([place])   . Dyspnea   . GERD (gastroesophageal reflux disease)   . H/O tricuspid valve repair 2012  . History of echocardiogram    Echo at [facility] in [facility] 4/12: mod LVH, EF 60-65%, mod LAE, tissue MVR ok, mod TR, mod pulmo HTN with RVSP 48 mmHg  . Hypertension   . Obstructive sleep apnea   . Paroxysmal atrial fibrillation (HCC)    s/p Maze procedure at time of MVR in 2011  . Pulmonary HTN ([place])   . 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 AV FISTULA PLACEMENT Left 07/13/2017   Procedure: LEFT ARM ARTERIOVENOUS (AV) FISTULA CREATION;  Surgeon: Waynetta Sandy, MD;  Location:  [place];  Service: Vascular;  Laterality: Left;  . CARDIAC SURGERY    . CARDIAC VALVE REPLACEMENT  2011  . CARDIOVERSION N/A 07/09/2017   Procedure: CARDIOVERSION;  Surgeon: Larey Dresser, MD;  Location: [facility] ENDOSCOPY;  Service: Cardiovascular;  Laterality: N/A;  . COLONOSCOPY N/A 07/16/2017   Procedure: COLONOSCOPY;  Surgeon: Jerene Bears, MD;  Location: [place];  Service: Gastroenterology;  Laterality: N/A;  . ESOPHAGOGASTRODUODENOSCOPY N/A 07/16/2017   Procedure: ESOPHAGOGASTRODUODENOSCOPY (EGD);  Surgeon: Jerene Bears, MD;  Location: [facility] ENDOSCOPY;  Service: Gastroenterology;  Laterality: N/A;  . INSERTION OF DIALYSIS CATHETER Right 07/13/2017   Procedure: INSERTION OF DIALYSIS CATHETER RIGHT INTERNAL JUGULAR;  Surgeon: Waynetta Sandy, MD;  Location: [place];  Service: Vascular;  Laterality: Right;  . RIGHT HEART CATH N/A 06/29/2017   Procedure: RIGHT HEART CATH;  Surgeon: Larey Dresser, MD;  Location: [place] CV LAB;  Service: Cardiovascular;  Laterality: N/A;  . RIGHT/LEFT HEART CATH AND CORONARY ANGIOGRAPHY N/A 08/19/2017   Procedure: RIGHT/LEFT HEART CATH AND CORONARY ANGIOGRAPHY;  Surgeon: Leonie Man, MD;  Location: [place] CV LAB;  Service: Cardiovascular;  Laterality: N/A;  . TEE WITHOUT CARDIOVERSION N/A 07/09/2017   Procedure: TRANSESOPHAGEAL ECHOCARDIOGRAM (TEE);  Surgeon: Larey Dresser, MD;  Location: [facility] ENDOSCOPY;  Service: Cardiovascular;  Laterality: N/A;     Home Medications:  Prior to Admission medications   Medication Sig Start Date End Date Taking? Authorizing Provider  ACCU-CHEK SOFTCLIX LANCETS lancets USE AS INSTRUCTED 3 TIMES DAILY 08/04/17  Yes [name], MD  acetaminophen (TYLENOL) 325 MG tablet Take 650 mg by mouth every 6 (six) hours.   Yes [provider]  acetaminophen-codeine (TYLENOL #3) 300-30 MG tablet Take 1 tablet by mouth every 12 (twelve) hours as needed for moderate pain. Patient taking differently: Take 1  tablet by mouth 2 (two) times daily.  08/04/17  Yes [name], MD  allopurinol (ZYLOPRIM) 300 MG tablet Take 0.5 tablets (150 mg total) by mouth daily. 07/28/17  Yes Shirley Friar, PA-C  atorvastatin (LIPITOR) 20 MG tablet Take 1 tablet (20 mg total) by mouth daily. 07/28/17  Yes Shirley Friar, PA-C  Blood Glucose Monitoring Suppl (ACCU-CHEK AVIVA PLUS) w/Device KIT USE AS INSTRUCTED 3 TIMES DAILY. E11.9 08/31/17  Yes [name], MD  calcium acetate (PHOSLO) 667 MG capsule Take 1 capsule (667 mg total) by mouth 3 (three) times daily with meals. Patient taking differently: Take 667-2,001 mg by mouth See admin instructions. Pt takes 2,069m by mouth three times daily and 6661mtwice daily with snacks 07/27/17  Yes Tillery, MiSatira MccallumPA-C  cephALEXin (KEFLEX) 500 MG capsule Take 500 mg by mouth at bedtime. 08/25/17  Yes  [provider]  colchicine 0.6 MG tablet Take 1.2 mg by mouth daily.  06/02/17  Yes [provider]  Darbepoetin Alfa (ARANESP) 200 MCG/0.4ML SOSY injection Inject 0.4 mLs (200 mcg total) into the skin every Saturday at 6 PM. 08/01/17  Yes Tillery, Satira Mccallum, PA-C  diclofenac sodium (VOLTAREN) 1 % GEL Apply 4 g topically 4 (four) times daily.   Yes [provider]  docusate sodium (COLACE) 100 MG capsule Take 1 capsule (100 mg total) by mouth 2 (two) times daily. 07/27/17  Yes Shirley Friar, PA-C  folic acid (FOLVITE) 1 MG tablet TAKE 1 TABLET BY MOUTH DAILY. Patient taking differently: TAKE 1MG BY MOUTH ONCE DAILY 04/20/17  Yes Amao, Enobong, MD  glucose blood (ACCU-CHEK AVIVA PLUS) test strip USE 3 TIMES DAILY AS DIRECTED. 08/04/17  Yes [name], MD  hydrOXYzine (ATARAX/VISTARIL) 10 MG tablet Take 10 mg by mouth every 6 (six) hours as needed for itching or anxiety. 07/28/17  Yes [provider]  metoprolol succinate (TOPROL-XL) 25 MG 24 hr tablet Take 1 tablet (25 mg total) by mouth 2 (two) times  daily. Patient taking differently: Take 25 mg by mouth daily.  07/27/17  Yes Shirley Friar, PA-C  midodrine (PROAMATINE) 5 MG tablet Take 1 tablet (5 mg total) by mouth 3 (three) times daily with meals. 07/27/17  Yes Shirley Friar, PA-C  omeprazole (PRILOSEC) 20 MG capsule Take 1 capsule (20 mg total) by mouth daily. 03/03/17  Yes [name], MD  OXYGEN Inhale 2 L into the lungs daily.   Yes [provider]  tiZANidine (ZANAFLEX) 4 MG tablet Take 1 tablet (4 mg total) by mouth every 8 (eight) hours as needed for muscle spasms. 08/04/17  Yes [name], MD  warfarin (COUMADIN) 2.5 MG tablet Take 1 tablet (2.5 mg total) by mouth daily at 6 PM. Or as directed by coumadin clinic 07/27/17  Yes Shirley Friar, PA-C    Inpatient Medications: Scheduled Meds: . acetaminophen  650 mg Oral Q6H  . allopurinol  150 mg Oral Daily  . atorvastatin  20 mg Oral Daily  . calcium acetate  2,001 mg Oral TID WC  . [START ON 09/05/2017] darbepoetin (ARANESP) injection - DIALYSIS  200 mcg Intravenous Q Sat-HD  . docusate sodium  100 mg Oral BID  . feeding supplement (PRO-STAT SUGAR FREE 64)  30 mL Oral BID  . folic acid  1 mg Oral Daily  . [START ON 09/04/2017] Influenza vac split quadrivalent PF  0.5 mL Intramuscular Tomorrow-1000  . insulin aspart  0-9 Units Subcutaneous TID WC  . metoprolol succinate  25 mg Oral BID  . midodrine  5 mg Oral TID WC  . pantoprazole  40 mg Oral Daily  . warfarin  1 mg Oral ONCE-1800  . Warfarin - Pharmacist Dosing Inpatient   Does not apply q1800   Continuous Infusions: . sodium chloride    . sodium chloride     PRN Meds: sodium chloride, sodium chloride, acetaminophen-codeine, calcium acetate, hydrOXYzine, tiZANidine  Allergies:   No Known Allergies  Social History:   Social History   Social History  . Marital status: Single    Spouse name: N/A  . Number of children: N/A  . Years of education: N/A   Occupational History  .       on disability   Social History Main Topics  . Smoking status: Former Smoker    Packs/day: 1.00    Types: Cigarettes  .  Smokeless tobacco: Former Systems developer     Comment: said he is not a regular smoker  . Alcohol use No  . Drug use: No  . Sexual activity: Not Currently   Other Topics Concern  . Not on file   Social History Narrative  . No narrative on file    Family History:    Family History  Problem Relation Age of Onset  . Heart attack Neg Hx      ROS:  Please see the history of present illness.  ROS  General:no colds or fevers, no weight changes Skin:no rashes or ulcers HEENT:no blurred vision, no congestion CV:see HPI PUL:see HPI GI:no diarrhea constipation or melena, no indigestion GU:no hematuria, no dysuria MS:no joint pain, no claudication Neuro:no syncope, no lightheadedness Endo:+ diabetes, no thyroid disease    Physical Exam/Data:   Vitals:   09/03/17 0033 09/03/17 0040 09/03/17 0143 09/03/17 0900  BP: 105/63 112/64 91/61 (!) 103/58  Pulse: 83 84 96 81  Resp: '10 18 19 18  ' Temp:  97.7 F (36.5 C) 98.5 F (36.9 C) 98.4 F (36.9 C)  TempSrc:  Oral Oral Oral  SpO2: 90% 91% 100% 100%  Weight:  165 lb 5.5 oz (75 kg) 166 lb 0.1 oz (75.3 kg)   Height:   '5\' 5"'  (1.651 m)     Intake/Output Summary (Last 24 hours) at 09/03/17 1308 Last data filed at 09/03/17 0900  Gross per 24 hour  Intake              240 ml  Output             1554 ml  Net            -1314 ml   Filed Weights   09/02/17 2152 09/03/17 0040 09/03/17 0143  Weight: 168 lb 10.4 oz (76.5 kg) 165 lb 5.5 oz (75 kg) 166 lb 0.1 oz (75.3 kg)   Body mass index is 27.62 kg/m.  General:  Well nourished, well developed, in no acute distress  HEENT: normal Lymph: no adenopathy Neck: no JVD Endocrine:  No thryomegaly Vascular: No carotid bruits; do not palpate pedals, feet equally warm and brawny in appearance.  Cardiac:  normal S1, S2; RRR;  3/6 systolic murmur, dialysis cath in Rt  subclavian.  Lungs:  clear to auscultation bilaterally, no wheezing, rhonchi or rales  Abd: soft, nontender, no hepatomegaly  Ext: no edema Musculoskeletal:  No deformities, BUE and BLE strength normal and equal Skin: warm and dry  Neuro: alert and oriented MAE follows commands no focal abnormalities noted Psych:  Normal affect   Relevant CV Studies: ECHO 08/13/17 Study Conclusions  - Left ventricle: The cavity size was normal. Wall thickness was   increased in a pattern of mild LVH. Indeterminant diastolic   function (atrial fibrillation). Systolic function was low normal   to mildly reduced. The estimated ejection fraction was in the   range of 50% to 55%. Although no diagnostic regional wall motion   abnormality was identified, this possibility cannot be completely   excluded on the basis of this study. - Ventricular septum: D-shaped interventricular septum suggests RV   pressure/volume overload. - Aortic valve: There was no stenosis. There was mild   regurgitation. - Aorta: Mildly dilated ascending aorta. Ascending aortic diameter:   39 mm (S). - Mitral valve: There is a bioprosthetic mitral valve. Mildly   elevated mean gradient across the bioprosthetic mitral valve,   doubt significant stenosis. There was no significant  regurgitation. Mean gradient (D): 8 mm Hg. - Left atrium: The atrium was severely dilated. - Right ventricle: The cavity size was severely dilated. Systolic   function was moderately reduced. - Right atrium: The atrium was moderately dilated. - Tricuspid valve: S/p tricuspid valve repair. The valve does not   appear to fully coapt. There was severe regurgitation. Peak RV-RA   gradient (S): 48 mm Hg. - Pulmonary arteries: PA peak pressure: 63 mm Hg (S). - Systemic veins: IVC measured 3.3 cm with < 50% respirophasic   variation, suggesting RA pressure 15 mmHg. - Pericardium, extracardiac: A trivial pericardial effusion was    identified.  Impressions:  - The patient was in atrial fibrillation. Normal LV size with mild   LV hypertrophy. EF 50-55%, low normal to mildly reduced. D-shaped   interventricular septum suggestive of RV pressure/volume   overload. Severely dilated RV with moderately decreased systolic   function. Bioprosthetic mitral valve with mildly elevated mean   gradient, no regurgitation noted. Mild aortic insufficiency. S/p   TV repair but there is severe TR. Moderate pulmonary   hypertension.  Procedures   RIGHT/LEFT HEART CATH AND CORONARY ANGIOGRAPHY  Conclusion     RPDA lesion, 100 %stenosed - unable to see the occlusion site, but collaterals filling the distal vessel are seen. Not a PCI option & likely not acute.  Otherwise minimal CAD elsewhere.  Hemodynamic findings consistent with at least moderate pulmonary hypertension with mildly elevated LV pressures   No PCI amenable target noted. There does appear to be potential occlusion of the distal PDA with left to right collaterals which would explain the patient's stress test results. However there was no stump visualized, and the distal vessel is too small for PCI. Furthermore, given the collateralization, this does not appear to be acute.  Recommend continued management.    Diagnostic Diagram       Laboratory Data:  Chemistry Recent Labs Lab 09/02/17 2214 09/03/17 0320 09/03/17 0508  NA 136 137 137  K 3.6 3.7 3.8  CL 97* 96* 96*  CO2 '29 29 30  ' GLUCOSE 108* 72 77  BUN '18 12 11  ' CREATININE 4.08* 3.12* 3.20*  CALCIUM 8.7* 8.6* 8.5*  GFRNONAA 15* 21* 20*  GFRAA 17* 24* 23*  ANIONGAP '10 12 11     ' Recent Labs Lab 09/02/17 2214 09/03/17 0320 09/03/17 0508  ALBUMIN 2.9* 3.0* 2.8*   Hematology Recent Labs Lab 09/02/17 1424 09/02/17 2213 09/03/17 0320  WBC 6.3 5.1 6.7  RBC 4.00* 3.82* 4.16*  HGB 10.5* 10.2* 11.0*  HCT 33.0* 31.4* 34.5*  MCV 82.5 82.2 82.9  MCH 26.3 26.7 26.4  MCHC 31.8 32.5 31.9   RDW 19.0* 18.6* 18.8*  PLT 106* 117* 57*   Cardiac EnzymesNo results for input(s): TROPONINI in the last 168 hours.  Recent Labs Lab 09/02/17 1453  TROPIPOC 0.00    BNPNo results for input(s): BNP, PROBNP in the last 168 hours.  DDimer No results for input(s): DDIMER in the last 168 hours.  Radiology/Studies:  Dg Chest 2 View  Result Date: 09/02/2017 CLINICAL DATA:  Shortness of breath, atrial fibrillation, back pain. Dialysis dependent renal failure. The patient was unable to fill with initiates treatment today. EXAM: CHEST  2 VIEW COMPARISON:  PA and lateral chest x-ray of August 20, 2017 FINDINGS: The lungs are reasonably well inflated. There are bilateral pleural effusions greatest on the right. The interstitial markings are increased. The pulmonary vascularity is engorged. The cardiac silhouette is enlarged. The dialysis  catheter tip projects over the midportion of the SVC. There is calcification in the wall of the aortic arch. IMPRESSION: CHF with mild interstitial edema and small bilateral pleural effusions. Thoracic aortic atherosclerosis. Electronically Signed   By: David  Martinique M.D.   On: 09/02/2017 15:02    TEE and DCCV 07/20/17 Study Conclusions  - Left ventricle: The cavity size was normal. Wall thickness was   normal. Systolic function was normal. The estimated ejection   fraction was in the range of 55% to 60%. Wall motion was normal;   there were no regional wall motion abnormalities. No evidence of   thrombus. - Aortic valve: There was no stenosis. There was moderate   regurgitation. - Aorta: Normal caliber aorta with grade III plaque in descending   thoracic aorta. - Mitral valve: Status post mitral valve replacement with   bioprosthetic valve. Trivial MR. Mean gradient 6 mmHg across   valve, does not appear to have significant stenosis. - Left atrium: The atrium was severely dilated. The LA appendage   appears to have been ligated. No LA thrombus. - Right  ventricle: D-shaped interventricular septum suggestive of   RV pressure/volume overload. The RV was severely dilated with   moderately decreased systolic function. - Right atrium: The atrium was moderately dilated. - Atrial septum: No defect or patent foramen ovale was identified. - Tricuspid valve: S/p tricuspid valve repair with moderate to   severe TR. Peak RV-RA gradient 25 mmHg.  Impressions:  - Proceed to DCCV. Successful cardioversion. No cardiac source of   emboli was indentified.  Assessment and Plan:   1. Atrial fib permanent on warfarin - he is on Toprol 25 mg daily but with episodes of hypotension may not be able to take.  His amiodarone has been stopped due to permanent a fib.  May be beneficial to resume to rate control.  Dr. Radford Pax to see.  Has had hx of Maze procedure as well.  CHA2DS2VASC 3  2.        MVR and TR repair with severe TR.    3.         Acute SOB - dialysis is 4 days a week due to hypotension.  He still has episodes of SOB.     4.         CAD with total RPDA and Lt to right collaterals but minimal disease in other vessels  -neg troponin.  5.         ESRD on HD 4 days per week.  Has only been on dialysis since Sept 2017.    6.          DM per IM  7.          Hypotension on midodrine  BP 91/61 to 103/58      For questions or updates, please contact [place] Please consult [email or web address] for contact info under Cardiology/STEMI.   Signed, Cecilie Kicks, NP  09/03/2017 1:08 PM

## 2017-09-03 NOTE — Progress Notes (Signed)
[place] for warfarin  Indication: atrial fibrillation (also with hx mitral valve replacement - porcine)  No Known Allergies  Patient Measurements: Height: 5\' 5"  (165.1 cm) Weight: 166 lb 0.1 oz (75.3 kg) IBW/kg (Calculated) : 61.5  Vital Signs: Temp: 98.4 F (36.9 C) (10/18 0900) Temp Source: Oral (10/18 0900) BP: 103/58 (10/18 0900) Pulse Rate: 81 (10/18 0900)  Labs:  Recent Labs  09/02/17 1424 09/02/17 2213 09/02/17 2214 09/03/17 0320 09/03/17 0508  HGB 10.5* 10.2*  --  11.0*  --   HCT 33.0* 31.4*  --  34.5*  --   PLT 106* 117*  --  57*  --   LABPROT 34.4*  --   --   --  28.8*  INR 3.44  --   --   --  2.74  CREATININE 3.92*  --  4.08* 3.12* 3.20*    Estimated Creatinine Clearance: 24.1 mL/min (A) (by C-G formula based on SCr of 3.2 mg/dL (H)).   Assessment: 56 yo male admitted with SOB. On warfarin PTA for afib, pharmacy consulted to continue.   INR supratherapeutic at 3.44 on admission with last dose of warfarin 10/16.  PTA warfarin dose: 2.5mg  daily.  INR down to 2.74 this morning. Hgb 11, plts low at 57- patient has had thrombocytopenia in the past, but not this low. No bleeding noted currently.  Goal of Therapy:  INR 2-3 Monitor platelets by anticoagulation protocol: Yes   Plan:  Warfarin 1mg  po x1 tonight  Daily INR and CBC Monitor for s/s bleeding  Aftin Lye D. Ebubechukwu Jedlicka, PharmD, BCPS Clinical Pharmacist Pager: [phone] Clinical Phone for 09/03/2017 until 3:30pm: [phone] If after 3:30pm, please call main pharmacy at [phone] 09/03/2017 11:41 AM

## 2017-09-03 NOTE — Progress Notes (Addendum)
Subjective: Michael [name] is feeling well this morning. His SOB has significantly improved. States that yesterday while on dialysis he felt extremely SOB and required an increased in his oxygen from 2L to 4L/min [place]. After stopping dialysis and arriving at the ED his SOB returned to baseline but he began to experience chest pain that was reproducible with palpation and positional. His chest pain has resolved this AM with no recurrence since admission. He was not able to tolerate HD last night.   He reports being on HD for < 3 months and is unsure what caused his kidney damage. He has not family history of kidney disease. He still produces urine but it is minimal. He states that almost every dialysis session since discharge has needed to be stopped prematurely due to SOB or for some other reason. His cardiac history is complicated. He had a Mitral valve replacement and tricupsid valve repair in 2011. Denies a history of MI. Understands that his heart does not pump as efficiently as others.   Support may be another issue for this patient. He states his only family in town is his daughter. Lives alone and does not have much social contact. Is wanting to live to see his daughter graduate in May. All questions and concerns answered/addressed.   Objective: Vital signs in last 24 hours: Vitals:   09/03/17 0030 09/03/17 0033 09/03/17 0040 09/03/17 0143  BP: 98/64 105/63 112/64 91/61  Pulse: 80 83 84 96  Resp: 13 10 18 19   Temp:   97.7 F (36.5 C) 98.5 F (36.9 C)  TempSrc:   Oral Oral  SpO2: 92% 90% 91% 100%  Weight:   165 lb 5.5 oz (75 kg) 166 lb 0.1 oz (75.3 kg)  Height:    5\' 5"  (1.651 m)   General: Alert male resting comfortably in bed  HENT: Atraumatic, normocephalic, moist mucus membranes Pulm: On 2L/min [place], diminished breath sounds, no wheezing or rales CV: Irregular rhythm, normal rate, systolic murmur noted, +JVD Abdomen: Active bowel sounds, soft, distended, no tenderness to palpation,  no fluid wave. Extremities: No LE edema, pulses palpable in all extremities, faint bruit palpated in the left anterior cubital fossa  Assessment/Plan:  1. Dyspnea 2/2 Complication of ESRD - Extremely volume sensitive and preload dependent, episodes often resolve once HD is stopped. While he is volume up on PE, 5 lb weight gain from dry weight (EDW 73kg), and CXR, he does not of orthopnea or PND to make be think this is driven by volume overload. These episodes are most likely due to a decrease in SV and a physiologic increase in HR - HD ended almost 2 hours premature last night due to anxiety and tachycardia. HR spiking to between 130-150s. Given Morphine 1 mg IV while dialyzing in attempts to alleviate patient's anxiety  - Previous hospitalization, palliative was consulted. Family wants full scope until HD is no longer an option. Do think he would be a candidate for hospice given his advanced heart failure and difficulty tolerating ESRD - Nephrology onboard - Will consult cardiology to evaluate the need to switch to an antiarrhythmic   2. MSK Chest Pain  - Presented to the hospital on 9/24 with suspected cardiac arrest, CPR initiated, ROSC after approximately 2 minutes. Ischemic eval during the hospitalization unremarkable - Pain is reproducible and positional making this MSK in etiology  - EKG unchanged from prior and troponin negative  3. 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 HD stopped last night due to reflex tachycardia and patient anxiety.  - K 3.78, CO2 30, Ca 9.5, PO4 2.7, Hgb 11.0  - Nephrology onboard   4. Atrial Fibrillation  - Currently rate controlled, s/p maze procedure 2011 - Reflex tachycardia during HD due to changes in volume status  - On metoprolol 25 mg BID and Warfarin per pharmacy  - INR 2.77 this AM - Will consult cardiology to evaluate the need  to switch to an antiarrhythmic   5. HFpEF - Echocardiogram on 9/27 illustrating EF 50-55%, D-shaped intraventricular septum suggestive of RV volume overload, and signs of right heart dysfunction (systolic) - Right/left heart cath done on 10/3 showing complete occlusion of the RPDA with collateral flow, elevated LV EDP (signifying volume overload), and moderate pulmonary HTN.   6. Chronic Hypotension  - Hypotensive during HD, currently on Midodrine 5 mg TID  Dispo: Anticipated discharge in approximately >1 day(s).   Michael Homes, MD 09/03/2017, 6:13 AM My Pager: [phone]

## 2017-09-03 NOTE — Progress Notes (Signed)
ANTICOAGULATION CONSULT NOTE - Initial Consult  Pharmacy Consult for warfarin  Indication: atrial fibrillation (also with hx mitral valve replacement - porcine)  No Known Allergies  Patient Measurements: Height: 5\' 5"  (165.1 cm) Weight: 166 lb 0.1 oz (75.3 kg) IBW/kg (Calculated) : 61.5  Vital Signs: Temp: 98.5 F (36.9 C) (10/18 0143) Temp Source: Oral (10/18 0143) BP: 91/61 (10/18 0143) Pulse Rate: 96 (10/18 0143)  Labs:  Recent Labs  09/02/17 1424 09/02/17 2213 09/02/17 2214 09/03/17 0320  HGB 10.5* 10.2*  --   --   HCT 33.0* 31.4*  --   --   PLT 106* 117*  --   --   LABPROT 34.4*  --   --   --   INR 3.44  --   --   --   CREATININE 3.92*  --  4.08* 3.12*    Estimated Creatinine Clearance: 24.8 mL/min (A) (by C-G formula based on SCr of 3.12 mg/dL (H)).   Medical History: Past Medical History:  Diagnosis Date  . Anemia of chronic disease   . Asthma    said he does not know  . Chronic kidney disease (CKD), stage IV (severe) ([place])   . Chronic systolic CHF (congestive heart failure) (HCC)    a. prior EF normal; b. Echo 10/16: Mild LVH, EF 30-35%, mitral valve bioprosthesis present without evidence of stenosis or regurgitation, severe LAE, mild RVE with mild to moderately reduced RVSF, moderate RAE  . Coronary artery disease   . Diabetes mellitus type 2 in obese ([place])   . Diabetes mellitus without complication ([place])   . Dyspnea   . GERD (gastroesophageal reflux disease)   . H/O tricuspid valve repair 2012  . History of echocardiogram    [facility] 4/12: mod LVH, EF 60-65%, mod LAE, tissue MVR ok, mod TR, mod pulmo HTN with RVSP 48 mmHg  . Hypertension   . Obstructive sleep apnea   . Paroxysmal atrial fibrillation (HCC)    s/p Maze procedure at time of MVR in 2011  . Pulmonary HTN ([place])   . S/P mitral valve replacement with porcine valve 2012   Assessment: 56 yo male admitted with SOB. On warfarin PTA for afib, pharmacy consulted to continue.    INR supratherapeutic at 3.44 on admission with last dose of warfarin 10/16. CBC low, but stable.   PTA warfarin dose: 2.5mg  daily   Goal of Therapy:  INR 2-3 Monitor platelets by anticoagulation protocol: Yes   Plan:  No warfarin tonight Daily INR, CBC prn  Monitor for s/s bleeding  Lavonda Jumbo, PharmD Clinical Pharmacist 09/03/17 4:47 AM

## 2017-09-03 NOTE — Progress Notes (Signed)
Arrival Method: Patient arrived in stretcher from ED./HEMO Mental Orientation: alert Telemetry:14 Assessment: See Doc Flow sheets. Skin: Warm, dry and intact. IV: Peripheral I  Right  wrist Pain:.denies Fall Prevention Safety Plan: Patient educated about fall prevention safety plan, understood and acknowledged. Admission Screening:6700 Orientation: Patient has been oriented to the unit, staff and to the room.

## 2017-09-04 DIAGNOSIS — I071 Rheumatic tricuspid insufficiency: Secondary | ICD-10-CM

## 2017-09-04 DIAGNOSIS — I272 Pulmonary hypertension, unspecified: Secondary | ICD-10-CM

## 2017-09-04 LAB — GLUCOSE, CAPILLARY
GLUCOSE-CAPILLARY: 89 mg/dL (ref 65–99)
Glucose-Capillary: 111 mg/dL — ABNORMAL HIGH (ref 65–99)
Glucose-Capillary: 97 mg/dL (ref 65–99)
Glucose-Capillary: 88 mg/dL (ref 65–99)

## 2017-09-04 LAB — CBC
HCT: 33.3 % — ABNORMAL LOW (ref 39.0–52.0)
HEMOGLOBIN: 10.5 g/dL — AB (ref 13.0–17.0)
MCH: 26.4 pg (ref 26.0–34.0)
MCHC: 31.5 g/dL (ref 30.0–36.0)
MCV: 83.7 fL (ref 78.0–100.0)
Platelets: 88 10*3/uL — ABNORMAL LOW (ref 150–400)
RBC: 3.98 MIL/uL — ABNORMAL LOW (ref 4.22–5.81)
RDW: 18.8 % — ABNORMAL HIGH (ref 11.5–15.5)
WBC: 6.2 10*3/uL (ref 4.0–10.5)

## 2017-09-04 LAB — PROTIME-INR
INR: 3.17
PROTHROMBIN TIME: 32.3 s — AB (ref 11.4–15.2)

## 2017-09-04 NOTE — Progress Notes (Signed)
Patient c/o chest pain,pointing to sternum. Pt rates pain as 8/10, "maybe indigestion" per patient. VSS. MD on call made aware. EKG done,MD on call made aware of EKG result in epic. Order to give tylenol #3 that's already ordered. Will continue to monitor.  Sylwia Cuervo, Wonda Cheng, Therapist, sports

## 2017-09-04 NOTE — Progress Notes (Signed)
[facility] Progress Note  Subjective: no c/o  Vitals:   09/03/17 1848 09/03/17 2035 09/04/17 0453 09/04/17 0917  BP: 108/64 101/64 105/67 93/64  Pulse: 84 98 82 85  Resp: 18 18 18 18   Temp: 98.4 F (36.9 C) 98.6 F (37 C) 97.6 F (36.4 C) 98 F (36.7 C)  TempSrc: Oral Oral Oral Oral  SpO2: 100% 99% 97% 99%  Weight:  75.3 kg (166 lb)    Height:        Inpatient medications: . acetaminophen  650 mg Oral Q6H  . allopurinol  150 mg Oral Daily  . atorvastatin  20 mg Oral Daily  . calcium acetate  2,001 mg Oral TID WC  . [START ON 09/05/2017] darbepoetin (ARANESP) injection - DIALYSIS  200 mcg Intravenous Q Sat-HD  . docusate sodium  100 mg Oral BID  . feeding supplement (PRO-STAT SUGAR FREE 64)  30 mL Oral BID  . folic acid  1 mg Oral Daily  . Influenza vac split quadrivalent PF  0.5 mL Intramuscular Tomorrow-1000  . insulin aspart  0-9 Units Subcutaneous TID WC  . metoprolol succinate  25 mg Oral BID  . midodrine  10 mg Oral TID WC  . pantoprazole  40 mg Oral Daily  . Warfarin - Pharmacist Dosing Inpatient   Does not apply q1800   . sodium chloride    . sodium chloride     sodium chloride, sodium chloride, acetaminophen-codeine, calcium acetate, hydrOXYzine, tiZANidine  Exam: General: WDWN, NAD Neck: Supple. No lymphadenopathy. +JVD Lungs: CTA bilaterally with decreased breath sounds on RLL.  Heart: RRR. +2/6 murmur best heard at 5th intercostal space near L sternal border, +heave Abdomen: soft, nontender, no guarding, no rebound tenderness.  Lower extremities:no edema, ischemic changes, or open wounds  Neuro: A&Ox3. Moves all extremities spontaneously. Dialysis Access: L IJ TDC  CXR 10/17 > CHF with mild interstitial edema and small bilateral pleural effusions.  Dialysis:  [facility] [place] 4h  73kg  2/2.25  L IJ TDC   Hep 4200 -Mircera 233mcg IV q2wks -Venofer 50mg  IV qwk  Assessment: 1. Dyspnea 2/2 complication of ESRD - Improved, although did not  receive a full treatment. On O2 prn at home. Not sure SOB due to R HF, afib, pulm edema.  CXR wet on 10/17 admit day, had 1.5 L off that night on HD. Get repeat CXR after HD today.  2. ESRD - usual HD is MWFSat.  3. Hypotension/volume  - related to R HF. BP's run low on HD.  Raising midodrine to 10 tid.  4. Anemia of CKD  - Hgb 11.0, no ESA indicated. 5. Secondary Hyperparathyroidism -  Ca and Phos in goal. Hold binder. 6. Nutrition - Alb 2.8. Renavite, Prostat and renal/carb modified diet.  7. DM - per primary 8. A Fib - currently rate controlled on ToproXL 25 bid.  9. CHF - EF 50-55% - has severe R sided HF, moderate pulmonary HTN. Per primary   Plan - HD today and tomorrow, UF 2 L as Ronnie Derby MD [place] pager [phone]   09/04/2017, 11:44 AM    Recent Labs Lab 09/02/17 2214 09/03/17 0320 09/03/17 0508  NA 136 137 137  K 3.6 3.7 3.8  CL 97* 96* 96*  CO2 29 29 30   GLUCOSE 108* 72 77  BUN 18 12 11   CREATININE 4.08* 3.12* 3.20*  CALCIUM 8.7* 8.6* 8.5*  PHOS 3.4 2.7 2.7    Recent Labs Lab  09/02/17 2214 09/03/17 0320 09/03/17 0508  ALBUMIN 2.9* 3.0* 2.8*    Recent Labs Lab 09/02/17 2213 09/03/17 0320 09/04/17 0258  WBC 5.1 6.7 6.2  HGB 10.2* 11.0* 10.5*  HCT 31.4* 34.5* 33.3*  MCV 82.2 82.9 83.7  PLT 117* 57* 88*   Iron/TIBC/Ferritin/ %Sat    Component Value Date/Time   IRON 55 07/14/2017 0506   TIBC 392 07/14/2017 0506   FERRITIN 265 07/14/2017 0506   IRONPCTSAT 14 (L) 07/14/2017 8118

## 2017-09-04 NOTE — Progress Notes (Signed)
   Subjective: Nervous about dialysis this AM, but otherwise is feeling okay. Wasn't able to sleep overnight because he felt bad. Does not know why he felt bad. Has bilateral flank swelling/edema. Discussed the plan to see how he tolerates HD today. All questions and concerns addressed.   Objective: Vital signs in last 24 hours: Vitals:   09/03/17 0900 09/03/17 1848 09/03/17 2035 09/04/17 0453  BP: (!) 103/58 108/64 101/64 105/67  Pulse: 81 84 98 82  Resp: 18 18 18 18   Temp: 98.4 F (36.9 C) 98.4 F (36.9 C) 98.6 F (37 C) 97.6 F (36.4 C)  TempSrc: Oral Oral Oral Oral  SpO2: 100% 100% 99% 97%  Weight:   166 lb (75.3 kg)   Height:       General: Well developed male resting in the recliner Pulm: Good air movement, no wheezing or crackles heard CV: Distant heart sounds, irregular rhythm  Abdomen: Hyperactive bowel sounds, soft, no tenderness to palpation  Extremities: No LE edema  Assessment/Plan:  1. Dyspnea 2/2 Complication of ESRD - Extremely volume sensitive and preload dependent, these episodes are most likely due to a decrease in SV and a physiologic increase in HR - Previous hospitalization, palliative was consulted. Family wants full scope until HD is no longer an option. Do think he would be a candidate for hospice given his advanced heart failure and difficulty tolerating ESRD - Nephrology onboard plan for HD today  - Cardiology evaluated the patient yesterday and increased his Midodrine to 10 mg TID. Did not switch to rhythm control because he is in chronic A. Fib  2. MSK Chest Pain  - Presented to the hospital on 9/24 with suspected cardiac arrest, CPR initiated, ROSC after approximately 2 minutes. Ischemic eval during the hospitalization unremarkable - Pain is reproducible and positional making this MSK in etiology  - EKG unchanged from prior and troponin negative  3. 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 HD today will see how he tolerates it with an increase in his Midodrine to 10 mg TID   4. Atrial Fibrillation  - Currently rate controlled, s/p maze procedure 2011 - Reflex tachycardia during HD due to changes in volume status  - On metoprolol 25 mg BID and Warfarin per pharmacy  - INR 3.17 this AM - Cardiology wants to continue rate control vs switching to rhythm control due to his chronic A. Fib  5. HFpEF - Echocardiogram on 9/27 illustrating EF 50-55%, D-shaped intraventricular septum suggestive of RV volume overload, and signs of right heart dysfunction (systolic) - Right/left heart cath done on 10/3 showing complete occlusion of the RPDA with collateral flow, elevated LV EDP (signifying volume overload), and moderate pulmonary HTN.   6. Chronic Hypotension  - Midodrine 10 mg TID  Dispo: If he tolerates HD today will discharge tomorrow. If he does not we will need to get palliative back onboard to discuss options.   Ina Homes, MD 09/04/2017, 6:15 AM My Pager: [phone]

## 2017-09-04 NOTE — Progress Notes (Signed)
[place] for warfarin  Indication: atrial fibrillation (also with hx mitral valve replacement - porcine)  No Known Allergies  Patient Measurements: Height: 5\' 5"  (165.1 cm) Weight: 166 lb (75.3 kg) IBW/kg (Calculated) : 61.5  Vital Signs: Temp: 98 F (36.7 C) (10/19 0917) Temp Source: Oral (10/19 0917) BP: 93/64 (10/19 0917) Pulse Rate: 85 (10/19 0917)  Labs:  Recent Labs  09/02/17 1424 09/02/17 2213 09/02/17 2214 09/03/17 0320 09/03/17 0508 09/04/17 0258  HGB 10.5* 10.2*  --  11.0*  --  10.5*  HCT 33.0* 31.4*  --  34.5*  --  33.3*  PLT 106* 117*  --  57*  --  88*  LABPROT 34.4*  --   --   --  28.8* 32.3*  INR 3.44  --   --   --  2.74 3.17  CREATININE 3.92*  --  4.08* 3.12* 3.20*  --     Estimated Creatinine Clearance: 24.1 mL/min (A) (by C-G formula based on SCr of 3.2 mg/dL (H)).   Assessment: 56 yo male admitted with SOB. On warfarin PTA for afib, pharmacy consulted to continue warfarin.   INR supratherapeutic at 3.44 on admission 09/02/17, with last  PTA warfain dose taken on 10/16.  PTA warfarin dose: 2.5mg  daily.  Coumadin dose held on 10/17 due to INR above goal INR down to 2.74 yesterday 10/18.  10/19:  Today INR is 3.17 H/H low/stable , pltc low but improved to 88k today - h/o thrombocytopenia in the past.  No bleeding noted currently.  Goal of Therapy:  INR 2-3 Monitor platelets by anticoagulation protocol: Yes   Plan:  No Warfarin today  Daily INR and CBC Monitor for s/s bleeding   Nicole Cella, RPh Clinical Pharmacist Pager: [phone] 8a-330p [phone] 330p-1030p phone [phone] or [phone] Main pharmacy [phone] 09/04/2017 2:46 PM

## 2017-09-04 NOTE — Progress Notes (Signed)
Internal Medicine Attending:   I saw and examined the patient. I reviewed the resident's note and I agree with the resident's findings and plan as documented in the resident's note.  56 year old man who has end-stage right heart failure due to pulmonary hypertension and tricuspid valve regurgitation, also end-stage renal disease who is having difficulty tolerating HD sessions because of symptomatic hypotension, tachycardia, and chest pain. It seems from discussion with cardiology that there is nothing further we can do for his heart failure and a fib symptoms. I agree that given his severely dilated left atrium it is very unlikely we will be able to achieve rhythm control. We are at risk for having to discontinue HD at this point. He is not tolerating the sessions well because of his advanced heart disease, and is at increased risk for another acute arrest. We are going to see how HD session goes today, if he continues to have significant symptoms precluding HD completion, we will coordinate recommendations with nephrology and palliative care.

## 2017-09-05 LAB — CBC
HCT: 33.4 % — ABNORMAL LOW (ref 39.0–52.0)
HEMOGLOBIN: 10.8 g/dL — AB (ref 13.0–17.0)
MCH: 26.8 pg (ref 26.0–34.0)
MCHC: 32.3 g/dL (ref 30.0–36.0)
MCV: 82.9 fL (ref 78.0–100.0)
PLATELETS: 115 10*3/uL — AB (ref 150–400)
RBC: 4.03 MIL/uL — AB (ref 4.22–5.81)
RDW: 18.6 % — ABNORMAL HIGH (ref 11.5–15.5)
WBC: 7.3 10*3/uL (ref 4.0–10.5)

## 2017-09-05 LAB — BASIC METABOLIC PANEL
Anion gap: 12 (ref 5–15)
BUN: 26 mg/dL — AB (ref 6–20)
CO2: 26 mmol/L (ref 22–32)
CREATININE: 4.89 mg/dL — AB (ref 0.61–1.24)
Calcium: 8.9 mg/dL (ref 8.9–10.3)
Chloride: 92 mmol/L — ABNORMAL LOW (ref 101–111)
GFR, EST AFRICAN AMERICAN: 14 mL/min — AB (ref >60)
GFR, EST NON AFRICAN AMERICAN: 12 mL/min — AB (ref >60)
Glucose, Bld: 96 mg/dL (ref 65–99)
POTASSIUM: 4.2 mmol/L (ref 3.5–5.1)
SODIUM: 130 mmol/L — AB (ref 135–145)

## 2017-09-05 LAB — GLUCOSE, CAPILLARY
GLUCOSE-CAPILLARY: 82 mg/dL (ref 65–99)
Glucose-Capillary: 92 mg/dL (ref 65–99)
Glucose-Capillary: 96 mg/dL (ref 65–99)

## 2017-09-05 LAB — PHOSPHORUS: PHOSPHORUS: 3.2 mg/dL (ref 2.5–4.6)

## 2017-09-05 LAB — PROTIME-INR
INR: 3.19
PROTHROMBIN TIME: 32.4 s — AB (ref 11.4–15.2)

## 2017-09-05 MED ORDER — DIPHENHYDRAMINE HCL 25 MG PO CAPS
25.0000 mg | ORAL_CAPSULE | Freq: Once | ORAL | Status: AC
  Administered 2017-09-05: 25 mg via ORAL
  Filled 2017-09-05: qty 1

## 2017-09-05 MED ORDER — DARBEPOETIN ALFA 200 MCG/0.4ML IJ SOSY
PREFILLED_SYRINGE | INTRAMUSCULAR | Status: AC
  Filled 2017-09-05: qty 0.4

## 2017-09-05 MED ORDER — HYDROCORTISONE 1 % EX CREA
TOPICAL_CREAM | Freq: Two times a day (BID) | CUTANEOUS | Status: DC
  Administered 2017-09-05: 23:00:00 via TOPICAL
  Filled 2017-09-05: qty 28

## 2017-09-05 NOTE — Progress Notes (Signed)
   Subjective: Patient was evaluated this morning.  He denies any shortness of breath or chest pain. He reports itching on his stomach and back and would like to take a bath  Objective:  Vital signs in last 24 hours: Vitals:   09/04/17 1821 09/04/17 2039 09/05/17 0430 09/05/17 0818  BP: 102/78 128/85 127/68 100/69  Pulse: 84 75 76 78  Resp: 18 20 17 15   Temp: 98.2 F (36.8 C) 97.7 F (36.5 C) 97.7 F (36.5 C) (!) 97.4 F (36.3 C)  TempSrc: Oral Oral Oral Oral  SpO2: 100% 100% 99% 100%  Weight:  174 lb 6.1 oz (79.1 kg)    Height:       Physical Exam  Constitutional: He is well-developed, well-nourished, and in no distress.  Cardiovascular: Normal rate and normal heart sounds.  Exam reveals no friction rub.   Irregularly Irregular  Pulmonary/Chest: Effort normal and breath sounds normal. No respiratory distress. He has no wheezes. He has no rales.  Abdominal: Soft. He exhibits no distension. There is no tenderness.  Raised papules with slight erythema, no signs of infection     Assessment/Plan:  Active Problems:   Volume overload  End-stage right heart failure due to pulmonary hypertension and tricuspid valve regurgitation Extremely volume sensitive and preload dependent, unfortunately leading to symptomatic hypotension, tachycardia, and chest pain during his hemodialysis sessions and therefore unable to tolerate full hemodialysis sessions. Discussed case with cardiology and no further medications are recommended for his heart failure and A. fib symptoms at this time.  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 Patient has been unable to tolerate full HD sessions outpatient due to hypotension, tachycardia and chest pain. Patient has been receiving serial HD sessions while inpatient. Nephrology following. -HD  per nephrology  Chronic Atrial Fibrillation, rate controlled Currently rate controlled. He is on metoprolol and warfarin per pharmacy.  INR this morning 3.19 -Metoprolol succinate -Warfarin per pharmacy  Small raised papules on stomach Appears to very minor allergic reaction.  Will try hydrocortisone cream and bath -hydrocortisone cream -bath  Dispo: Anticipated discharge pending clinical improvement.   Kalman Shan [place], DO 09/05/2017, 9:15 AM Pager: [phone]

## 2017-09-05 NOTE — Progress Notes (Signed)
[place] for warfarin  Indication: atrial fibrillation (also with hx mitral valve replacement - porcine)  No Known Allergies  Patient Measurements: Height: 5\' 5"  (165.1 cm) Weight: 174 lb 6.1 oz (79.1 kg) IBW/kg (Calculated) : 61.5  Vital Signs: Temp: 97.4 F (36.3 C) (10/20 0818) Temp Source: Oral (10/20 0818) BP: 100/69 (10/20 0818) Pulse Rate: 78 (10/20 0818)  Labs:  Recent Labs  09/03/17 0320 09/03/17 0508 09/04/17 0258 09/05/17 0246  HGB 11.0*  --  10.5* 10.8*  HCT 34.5*  --  33.3* 33.4*  PLT 57*  --  88* 115*  LABPROT  --  28.8* 32.3* 32.4*  INR  --  2.74 3.17 3.19  CREATININE 3.12* 3.20*  --  4.89*    Estimated Creatinine Clearance: 16.1 mL/min (A) (by C-G formula based on SCr of 4.89 mg/dL (H)).   Assessment: 56 yo male admitted with SOB. On warfarin PTA for afib, pharmacy consulted to continue warfarin.   INR supratherapeutic at 3.44 on admission 09/02/17, with last  PTA warfain dose taken on 10/16.  PTA warfarin dose: 2.5mg  daily.  Coumadin dose held on 10/17 due to INR above goal INR down to 2.74 yesterday 10/18.  10/19:  Today INR is 3.17 10/20:  INR is 3.19 H/H low/stable , pltc low but improved to 88k today - h/o thrombocytopenia in the past.  No bleeding noted currently.  Goal of Therapy:  INR 2-3 Monitor platelets by anticoagulation protocol: Yes   Plan:  No Warfarin today  Daily INR and CBC Monitor for s/s bleeding  Thank you Anette Guarneri, PharmD [phone] 09/05/2017 11:25 AM

## 2017-09-05 NOTE — Progress Notes (Signed)
[name] Progress Note  Subjective: no c/o. DIdn't get HD yest due to excess pt load.   Vitals:   09/04/17 1821 09/04/17 2039 09/05/17 0430 09/05/17 0818  BP: 102/78 128/85 127/68 100/69  Pulse: 84 75 76 78  Resp: 18 20 17 15   Temp: 98.2 F (36.8 C) 97.7 F (36.5 C) 97.7 F (36.5 C) (!) 97.4 F (36.3 C)  TempSrc: Oral Oral Oral Oral  SpO2: 100% 100% 99% 100%  Weight:  79.1 kg (174 lb 6.1 oz)    Height:        Inpatient medications: . acetaminophen  650 mg Oral Q6H  . allopurinol  150 mg Oral Daily  . atorvastatin  20 mg Oral Daily  . calcium acetate  2,001 mg Oral TID WC  . darbepoetin (ARANESP) injection - DIALYSIS  200 mcg Intravenous Q Sat-HD  . docusate sodium  100 mg Oral BID  . feeding supplement (PRO-STAT SUGAR FREE 64)  30 mL Oral BID  . folic acid  1 mg Oral Daily  . insulin aspart  0-9 Units Subcutaneous TID WC  . metoprolol succinate  25 mg Oral BID  . midodrine  10 mg Oral TID WC  . pantoprazole  40 mg Oral Daily  . Warfarin - Pharmacist Dosing Inpatient   Does not apply q1800   . sodium chloride    . sodium chloride     sodium chloride, sodium chloride, acetaminophen-codeine, calcium acetate, hydrOXYzine, tiZANidine  Exam: General: WDWN, NAD Neck: Supple. No lymphadenopathy. +JVD Lungs: CTA bilaterally with decreased breath sounds on RLL.  Heart: RRR. +2/6 murmur best heard at 5th intercostal space near L sternal border, +heave Abdomen: soft, nontender, no guarding, no rebound tenderness.  Lower extremities:no distal LE edema, some proximal thigh edema bilat, ischemic changes, or open wounds  Neuro: A&Ox3. Moves all extremities spontaneously. Dialysis Access: L IJ TDC  CXR 10/17 > CHF with mild interstitial edema and small bilateral pleural effusions.  Dialysis:  [facility] 4h  73kg  2/2.25  L IJ TDC   Hep 4200 -Mircera 270mcg IV q2wks -Venofer 50mg  IV qwk  Assessment: 1. SOB/ palpitations - consequence of afib/ RVR and  severe R HF.   2. ESRD - usual HD is MWFSat, 4 days/ week due to severe RHF.  Pt was felt to be marginal HD candidate from the start due to severe R HF.  Is having significant complications (cardiac arrest last admission, this admission rapid afib/ SOB/ hypotension episode on HD).  He has very poor prognosis. Have d/w patient and explained we are doing all we can and that we will continue to do HD as long as we think it is reasonable and safe but that there may come a time when we decide that it is no longer safe/ reasonable to continue dialysis in which case we would have to stop dialysis and transfer to hospice with 2-3 wks estimated time to death after HD withdrawal.  Pt acknowledged.  3. Hypotension/volume  - related to R HF. Raised midodrine to 10 tid.  4. Anemia of CKD  - Hgb 11.0, no ESA indicated. 5. Secondary Hyperparathyroidism -  Ca and Phos in goal. Hold binder. 6. Nutrition - Alb 2.8. Renavite, Prostat and renal/carb modified diet.  7. DM - per primary 8. PAF / severe R HF/ pulm HTN/ hx MVR and TV repair- currently rate controlled on ToproXL 25 bid.  9. CHF - EF 50-55% - has severe R sided HF, moderate pulmonary HTN  Plan - HD today, then resume MWFSat.    Kelly Splinter MD [facility] pager [phone]   09/05/2017, 10:59 AM    Recent Labs Lab 09/03/17 0320 09/03/17 0508 09/05/17 0246  NA 137 137 130*  K 3.7 3.8 4.2  CL 96* 96* 92*  CO2 29 30 26   GLUCOSE 72 77 96  BUN 12 11 26*  CREATININE 3.12* 3.20* 4.89*  CALCIUM 8.6* 8.5* 8.9  PHOS 2.7 2.7 3.2    Recent Labs Lab 09/02/17 2214 09/03/17 0320 09/03/17 0508  ALBUMIN 2.9* 3.0* 2.8*    Recent Labs Lab 09/03/17 0320 09/04/17 0258 09/05/17 0246  WBC 6.7 6.2 7.3  HGB 11.0* 10.5* 10.8*  HCT 34.5* 33.3* 33.4*  MCV 82.9 83.7 82.9  PLT 57* 88* 115*   Iron/TIBC/Ferritin/ %Sat    Component Value Date/Time   IRON 55 07/14/2017 0506   TIBC 392 07/14/2017 0506   FERRITIN 265 07/14/2017 0506    IRONPCTSAT 14 (L) 07/14/2017 6286

## 2017-09-06 DIAGNOSIS — R079 Chest pain, unspecified: Secondary | ICD-10-CM

## 2017-09-06 DIAGNOSIS — M546 Pain in thoracic spine: Secondary | ICD-10-CM

## 2017-09-06 DIAGNOSIS — R0602 Shortness of breath: Secondary | ICD-10-CM

## 2017-09-06 LAB — RENAL FUNCTION PANEL
ALBUMIN: 3.1 g/dL — AB (ref 3.5–5.0)
ANION GAP: 8 (ref 5–15)
BUN: 16 mg/dL (ref 6–20)
CHLORIDE: 98 mmol/L — AB (ref 101–111)
CO2: 28 mmol/L (ref 22–32)
Calcium: 8.8 mg/dL — ABNORMAL LOW (ref 8.9–10.3)
Creatinine, Ser: 3.32 mg/dL — ABNORMAL HIGH (ref 0.61–1.24)
GFR calc Af Amer: 22 mL/min — ABNORMAL LOW (ref >60)
GFR calc non Af Amer: 19 mL/min — ABNORMAL LOW (ref >60)
GLUCOSE: 75 mg/dL (ref 65–99)
PHOSPHORUS: 2.3 mg/dL — AB (ref 2.5–4.6)
POTASSIUM: 4.2 mmol/L (ref 3.5–5.1)
SODIUM: 134 mmol/L — AB (ref 135–145)

## 2017-09-06 LAB — CBC
HEMATOCRIT: 32.7 % — AB (ref 39.0–52.0)
HEMATOCRIT: 34.9 % — AB (ref 39.0–52.0)
HEMOGLOBIN: 11.1 g/dL — AB (ref 13.0–17.0)
Hemoglobin: 10.4 g/dL — ABNORMAL LOW (ref 13.0–17.0)
MCH: 26.1 pg (ref 26.0–34.0)
MCH: 26.3 pg (ref 26.0–34.0)
MCHC: 31.8 g/dL (ref 30.0–36.0)
MCHC: 31.8 g/dL (ref 30.0–36.0)
MCV: 82.2 fL (ref 78.0–100.0)
MCV: 82.7 fL (ref 78.0–100.0)
PLATELETS: 113 10*3/uL — AB (ref 150–400)
Platelets: 119 10*3/uL — ABNORMAL LOW (ref 150–400)
RBC: 3.98 MIL/uL — ABNORMAL LOW (ref 4.22–5.81)
RBC: 4.22 MIL/uL (ref 4.22–5.81)
RDW: 18.5 % — AB (ref 11.5–15.5)
RDW: 18.7 % — ABNORMAL HIGH (ref 11.5–15.5)
WBC: 5.6 10*3/uL (ref 4.0–10.5)
WBC: 5.3 10*3/uL (ref 4.0–10.5)

## 2017-09-06 LAB — PROTIME-INR
INR: 2.64
PROTHROMBIN TIME: 28 s — AB (ref 11.4–15.2)

## 2017-09-06 LAB — BASIC METABOLIC PANEL
ANION GAP: 7 (ref 5–15)
BUN: 16 mg/dL (ref 6–20)
CALCIUM: 8.5 mg/dL — AB (ref 8.9–10.3)
CO2: 27 mmol/L (ref 22–32)
Chloride: 98 mmol/L — ABNORMAL LOW (ref 101–111)
Creatinine, Ser: 3.19 mg/dL — ABNORMAL HIGH (ref 0.61–1.24)
GFR, EST AFRICAN AMERICAN: 23 mL/min — AB (ref >60)
GFR, EST NON AFRICAN AMERICAN: 20 mL/min — AB (ref >60)
Glucose, Bld: 86 mg/dL (ref 65–99)
Potassium: 4.5 mmol/L (ref 3.5–5.1)
SODIUM: 132 mmol/L — AB (ref 135–145)

## 2017-09-06 LAB — GLUCOSE, CAPILLARY
GLUCOSE-CAPILLARY: 101 mg/dL — AB (ref 65–99)
Glucose-Capillary: 88 mg/dL (ref 65–99)

## 2017-09-06 MED ORDER — SODIUM CHLORIDE 0.9 % IV SOLN: 100.0000 mL | INTRAVENOUS | Status: DC | PRN

## 2017-09-06 MED ORDER — WARFARIN SODIUM 2 MG PO TABS
2.0000 mg | ORAL_TABLET | Freq: Every day | ORAL | Status: DC
  Filled 2017-09-06: qty 1

## 2017-09-06 MED ORDER — HEPARIN SODIUM (PORCINE) 1000 UNIT/ML DIALYSIS
20.0000 [IU]/kg | INTRAMUSCULAR | Status: DC | PRN
Start: 2017-09-06 — End: 2017-09-06

## 2017-09-06 MED ORDER — HEPARIN SODIUM (PORCINE) 1000 UNIT/ML DIALYSIS: 1000.0000 [IU] | INTRAMUSCULAR | Status: DC | PRN

## 2017-09-06 MED ORDER — LIDOCAINE HCL (PF) 1 % IJ SOLN: 5.0000 mL | INTRAMUSCULAR | Status: DC | PRN

## 2017-09-06 MED ORDER — LIDOCAINE-PRILOCAINE 2.5-2.5 % EX CREA: 1.0000 "application " | TOPICAL_CREAM | CUTANEOUS | Status: DC | PRN

## 2017-09-06 MED ORDER — PENTAFLUOROPROP-TETRAFLUOROETH EX AERO: 1.0000 "application " | INHALATION_SPRAY | CUTANEOUS | Status: DC | PRN

## 2017-09-06 MED ORDER — MIDODRINE HCL 5 MG PO TABS: 10.0000 mg | ORAL_TABLET | Freq: Three times a day (TID) | ORAL | 2 refills | Status: DC

## 2017-09-06 MED ORDER — ALTEPLASE 2 MG IJ SOLR: 2.0000 mg | Freq: Once | INTRAMUSCULAR | Status: DC | PRN

## 2017-09-06 MED ORDER — HEPARIN SODIUM (PORCINE) 1000 UNIT/ML DIALYSIS: 4200.0000 [IU] | Freq: Once | INTRAMUSCULAR | Status: DC

## 2017-09-06 NOTE — Discharge Summary (Signed)
Name: Michael [name] MRN: [id number] DOB: 1961/01/23 56 y.o. PCP: [name], MD  Date of Admission: 09/02/2017  1:42 PM Date of Discharge: 09/06/2017 Attending Physician: Joni Reining, DO  Discharge Diagnosis: 1. Shortness of breath secondary to volume overload from end stage renal disease 2. 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 Chronic Atrial Fibrillation, rate controlled   Active Problems:   Volume overload  Discharge Medications: Allergies as of 09/06/2017   No Known Allergies     Medication List    TAKE these medications   ACCU-CHEK AVIVA PLUS w/Device Kit USE AS INSTRUCTED 3 TIMES DAILY. E11.9   ACCU-CHEK SOFTCLIX LANCETS lancets USE AS INSTRUCTED 3 TIMES DAILY   acetaminophen 325 MG tablet Commonly known as:  TYLENOL Take 650 mg by mouth every 6 (six) hours.   acetaminophen-codeine 300-30 MG tablet Commonly known as:  TYLENOL #3 Take 1 tablet by mouth every 12 (twelve) hours as needed for moderate pain. What changed:  when to take this   allopurinol 300 MG tablet Commonly known as:  ZYLOPRIM Take 0.5 tablets (150 mg total) by mouth daily.   atorvastatin 20 MG tablet Commonly known as:  LIPITOR Take 1 tablet (20 mg total) by mouth daily.   calcium acetate 667 MG capsule Commonly known as:  PHOSLO Take 1 capsule (667 mg total) by mouth 3 (three) times daily with meals. What changed:  how much to take  when to take this  additional instructions   cephALEXin 500 MG capsule Commonly known as:  KEFLEX Take 500 mg by mouth at bedtime.   colchicine 0.6 MG tablet Take 1.2 mg by mouth daily.   Darbepoetin Alfa 200 MCG/0.4ML Sosy injection Commonly known as:  ARANESP Inject 0.4 mLs (200 mcg total) into the skin every Saturday at 6 PM.   diclofenac sodium 1 % Gel Commonly known as:  VOLTAREN Apply 4 g topically 4 (four) times daily.   docusate sodium 100 MG capsule Commonly  known as:  COLACE Take 1 capsule (100 mg total) by mouth 2 (two) times daily.   folic acid 1 MG tablet Commonly known as:  FOLVITE TAKE 1 TABLET BY MOUTH DAILY. What changed:  See the new instructions.   glucose blood test strip Commonly known as:  ACCU-CHEK AVIVA PLUS USE 3 TIMES DAILY AS DIRECTED.   hydrOXYzine 10 MG tablet Commonly known as:  ATARAX/VISTARIL Take 10 mg by mouth every 6 (six) hours as needed for itching or anxiety.   metoprolol succinate 25 MG 24 hr tablet Commonly known as:  TOPROL-XL Take 1 tablet (25 mg total) by mouth 2 (two) times daily. What changed:  when to take this   midodrine 5 MG tablet Commonly known as:  PROAMATINE Take 2 tablets (10 mg total) by mouth 3 (three) times daily with meals. What changed:  how much to take   omeprazole 20 MG capsule Commonly known as:  PRILOSEC Take 1 capsule (20 mg total) by mouth daily.   OXYGEN Inhale 2 L into the lungs daily.   tiZANidine 4 MG tablet Commonly known as:  ZANAFLEX Take 1 tablet (4 mg total) by mouth every 8 (eight) hours as needed for muscle spasms.   warfarin 2.5 MG tablet Commonly known as:  COUMADIN Take 1 tablet (2.5 mg total) by mouth daily at 6 PM. Or as directed by coumadin clinic       Disposition and follow-up:   Michael [name] was discharged from [facility]  [facility] in good condition.  At the hospital follow up visit please address:  1.  If patient tolerating outpatient hemodialysis  2.  Labs / imaging needed at time of follow-up: none  3.  Pending labs/ test needing follow-up: none  Follow-up Appointments:   Hospital Course by problem list: Active Problems:   Volume overload Michael [name] is a 56 y.o. Male with a PMHx significant for ESRD on HD MWFSat, Chronic Atrial Fibrillation, Chronic Hypotension, and HFpEF who presented to the ED with SOB and increased oxygen demand while undergoing outpatient hemodialysis.  Shortness of breath during  outpatient hemodialysis for ESRD MWFSat Patient presented with shortness of breath and was found to be volume overloaded on physical exam and chest x-ray. Patient has been unable to tolerate full HD sessions outpatient due to hypotension, tachycardia and chest pain since starting hemodialysis 07/29/17. Patient received HD sessions while inpatient with resolution in symptoms.  Patient will continue HD outpatient as long as safe and tolerable.  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 Discussed case with cardiology and no further medications were recommended for his heart failure and A. fib symptoms. Continued with metoprolol and midodrine.  Midodrine was increased from 25m TID to 140mTID.  Chronic Atrial Fibrillation, rate controlled He was rate controlled with metoprolol.  Warfarin per pharmacy while inpatient.  Home warfarin continued on discharge.  Small raised papules on stomach Patient presented with raised papules on stomach that were puritic.  It appeared to be a mild allergic reaction. He was ordered hydrocortisone cream.  On day of discharge there was improvement in appearance.  Discharge Vitals:   BP (!) 92/51 (BP Location: Right Arm)   Pulse 86   Temp 98.3 F (36.8 C) (Oral)   Resp 18   Ht '5\' 5"'  (1.651 m)   Wt 171 lb 15.3 oz (78 kg)   SpO2 100%   BMI 28.62 kg/m   Pertinent Labs, Studies, and Procedures:  Hemodialysis  Discharge Instructions: Discharge Instructions    Diet - low sodium heart healthy    Complete by:  As directed    Discharge instructions    Complete by:  As directed    Michael [name] Please start taking 1075m2 pills) of midodrine 3 times a day with meals.   Increase activity slowly    Complete by:  As directed      Signed: HofValinda PartyO 09/06/2017, 10:43 AM   Pager: [phone]

## 2017-09-06 NOTE — Progress Notes (Signed)
[place] for warfarin  Indication: atrial fibrillation (also with hx mitral valve replacement - porcine)  No Known Allergies  Patient Measurements: Height: 5\' 5"  (165.1 cm) Weight: 171 lb 15.3 oz (78 kg) IBW/kg (Calculated) : 61.5  Vital Signs: Temp: 98.3 F (36.8 C) (10/21 0936) Temp Source: Oral (10/21 0936) BP: 92/51 (10/21 0936) Pulse Rate: 86 (10/21 0936)  Labs:  Recent Labs  09/04/17 0258 09/05/17 0246 09/06/17 0424 09/06/17 0624  HGB 10.5* 10.8* 10.4* 11.1*  HCT 33.3* 33.4* 32.7* 34.9*  PLT 88* 115* 113* 119*  LABPROT 32.3* 32.4* 28.0*  --   INR 3.17 3.19 2.64  --   CREATININE  --  4.89* 3.19* 3.32*    Estimated Creatinine Clearance: 23.6 mL/min (A) (by C-G formula based on SCr of 3.32 mg/dL (H)).   Assessment: 56 yo male admitted with SOB. On warfarin PTA for afib, pharmacy consulted to continue warfarin.   INR supratherapeutic at 3.44 on admission 09/02/17, with last  PTA warfain dose taken on 10/16.  PTA warfarin dose: 2.5mg  daily.  Coumadin dose held on 10/17 due to INR above goal INR down to 2.74 10/18.  10/19:  Today INR is 3.17 10/20:  INR is 3.19  INR today has trended down to 2.64, with supra-therapeutic INR on admission and continued high INRs, recommend 2 mg daily at discharge   Goal of Therapy:  INR 2-3 Monitor platelets by anticoagulation protocol: Yes   Plan:  Warfarin 2 mg po daily Outpatient INR check Tuesday or Wednesday   Thank you Anette Guarneri, PharmD [phone] 09/06/2017 12:10 PM

## 2017-09-06 NOTE — Progress Notes (Signed)
Internal Medicine Attending:   I saw and examined the patient. I reviewed the resident's note and I agree with the resident's findings and plan as documented in the resident's note. Patient reports HD went well yesterday, no complaints, I discussed his care with Dr Jonnie Finner who also reports HD was tolerated without complication and stable for discharge and resume outpatient HD although will remain high risk.  Patient understands that he is at risk for cardiac arrest due to his heart problems but there is little we can do to modify this risk.   Exam: irr irr, lungs CTAB, no LE edema A/P ESRD on HD, cardic arrest versus syncope on HD -stable for discharge home today

## 2017-09-06 NOTE — Progress Notes (Signed)
[facility] [name] Progress Note  Subjective: no SOB or CP, getting up to the BR  Vitals:   09/05/17 1749 09/05/17 2034 09/06/17 0627 09/06/17 0936  BP: (!) 119/58 (!) 97/45 108/67 (!) 92/51  Pulse: 91 (!) 104 67 86  Resp: 16 16 18 18   Temp: 97.6 F (36.4 C) 98.2 F (36.8 C) (!) 97.5 F (36.4 C) 98.3 F (36.8 C)  TempSrc: Oral Oral Oral Oral  SpO2: 100% 98% 100% 100%  Weight:  78 kg (171 lb 15.3 oz)    Height:        Inpatient medications: . acetaminophen  650 mg Oral Q6H  . allopurinol  150 mg Oral Daily  . atorvastatin  20 mg Oral Daily  . calcium acetate  2,001 mg Oral TID WC  . darbepoetin (ARANESP) injection - DIALYSIS  200 mcg Intravenous Q Sat-HD  . docusate sodium  100 mg Oral BID  . feeding supplement (PRO-STAT SUGAR FREE 64)  30 mL Oral BID  . folic acid  1 mg Oral Daily  . [START ON 09/07/2017] heparin  4,200 Units Dialysis Once in dialysis  . hydrocortisone cream   Topical BID  . insulin aspart  0-9 Units Subcutaneous TID WC  . metoprolol succinate  25 mg Oral BID  . midodrine  10 mg Oral TID WC  . pantoprazole  40 mg Oral Daily  . Warfarin - Pharmacist Dosing Inpatient   Does not apply q1800   . sodium chloride    . sodium chloride    . sodium chloride    . sodium chloride     sodium chloride, sodium chloride, sodium chloride, sodium chloride, acetaminophen-codeine, alteplase, calcium acetate, heparin, hydrOXYzine, lidocaine (PF), lidocaine-prilocaine, pentafluoroprop-tetrafluoroeth, tiZANidine  Exam: General: WDWN, NAD Neck: Supple. No lymphadenopathy. +JVD Lungs: CTA bilaterally with decreased breath sounds on RLL.  Heart: RRR. +2/6 murmur best heard at 5th intercostal space near L sternal border, +heave Abdomen: soft, nontender, no guarding, no rebound tenderness.  Lower extremities:no distal LE edema, some proximal thigh edema bilat, ischemic changes, or open wounds  Neuro: A&Ox3. Moves all extremities spontaneously. Dialysis Access: L IJ  TDC  CXR 10/17 > CHF with mild interstitial edema and small bilateral pleural effusions.  Dialysis:  [facility] 4h  73kg  2/2.25  L IJ TDC   Hep 4200 -Mircera 210mcg IV q2wks -Venofer 50mg  IV qwk  Assessment: 1. SOB/ palpitations - consequence of afib/ RVR, R HF and mild IS edema.   2. ESRD - usual HD is MWFSat, 4 days/ week due to severe RHF.  Tolerated HD x 2 as inpatient, no longer SOB, seen by cards for afib and no new rec's. OK for dc from renal standpoint, will resume OP HD on Monday at the OP center. Will use 4K bath to try to help prevent afib episodes.  3. Hypotension/volume  - related to R HF. Raised midodrine to 10 tid.  4. Anemia of CKD  - Hgb 11.0, no ESA indicated. 5. Secondary Hyperparathyroidism -  Ca and Phos in goal. Hold binder. 6. Nutrition - Alb 2.8. Renavite, Prostat and renal/carb modified diet.  7. DM - per primary 8. PAF / severe R HF/ pulm HTN/ hx MVR and TV repair- currently rate controlled on ToproXL 25 bid.  9. CHF - EF 50-55% - has severe R sided HF, moderate pulmonary HTN   Plan - as above   Kelly Splinter MD [employer] pager [phone]   09/06/2017, 10:21 AM  Recent Labs Lab 09/03/17 0508 09/05/17 0246 09/06/17 0424 09/06/17 0624  NA 137 130* 132* 134*  K 3.8 4.2 4.5 4.2  CL 96* 92* 98* 98*  CO2 30 26 27 28   GLUCOSE 77 96 86 75  BUN 11 26* 16 16  CREATININE 3.20* 4.89* 3.19* 3.32*  CALCIUM 8.5* 8.9 8.5* 8.8*  PHOS 2.7 3.2  --  2.3*    Recent Labs Lab 09/03/17 0320 09/03/17 0508 09/06/17 0624  ALBUMIN 3.0* 2.8* 3.1*    Recent Labs Lab 09/05/17 0246 09/06/17 0424 09/06/17 0624  WBC 7.3 5.6 5.3  HGB 10.8* 10.4* 11.1*  HCT 33.4* 32.7* 34.9*  MCV 82.9 82.2 82.7  PLT 115* 113* 119*   Iron/TIBC/Ferritin/ %Sat    Component Value Date/Time   IRON 55 07/14/2017 0506   TIBC 392 07/14/2017 0506   FERRITIN 265 07/14/2017 0506   IRONPCTSAT 14 (L) 07/14/2017 3601

## 2017-09-06 NOTE — Progress Notes (Signed)
   Subjective: Patient was evaluated this morning.  Patient states he feels well this morning.  He reports eating his breakfast in its entirety.  He has no complaints this morning.  He denies shortness of breath or chest pain.  He states that he had no complications during dialysis yesterday.  Objective:  Vital signs in last 24 hours: Vitals:   09/05/17 1730 09/05/17 1749 09/05/17 2034 09/06/17 0627  BP: (!) 108/53 (!) 119/58 (!) 97/45 108/67  Pulse: 85 91 (!) 104 67  Resp: 16 16 16 18   Temp:  97.6 F (36.4 C) 98.2 F (36.8 C) (!) 97.5 F (36.4 C)  TempSrc:  Oral Oral Oral  SpO2:  100% 98% 100%  Weight:   171 lb 15.3 oz (78 kg)   Height:       Physical Exam  Constitutional: He is well-developed, well-nourished, and in no distress.  Cardiovascular: Normal rate and normal heart sounds.  Exam reveals no friction rub.   Irregularly Irregular  Pulmonary/Chest: Effort normal and breath sounds normal. No respiratory distress. He has no wheezes. He has no rales.  Abdominal: Soft. He exhibits no distension. There is no tenderness.  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  Patient had hemodialysis yesterday with no complications. Will continue with metoprolol and midodrine. -Metoprolol succinate -Midodrine  ESRD on HD MWFSat Patient has been unable to tolerate full HD sessions outpatient due to hypotension, tachycardia and chest pain. Unfortunately  HD outpatient may not be reasonable or safe in the near future. This has been an ongoing discussion with the patient.  Nephrology OK with discharge today.   -continue HD outpatient, discharge  today  Chronic Atrial Fibrillation, rate controlled Currently rate controlled. He is on metoprolol and warfarin per pharmacy.  INR this morning 2.64 -Metoprolol succinate -Warfarin per pharmacy  Small raised papules on stomach Area stable this morning.   -hydrocortisone cream -bath  Dispo: Anticipated discharge today.   Kalman Shan Cunningham, DO 09/06/2017, 9:29 AM Pager: [phone]

## 2017-09-06 NOTE — Progress Notes (Signed)
CSW consulted for transportation to dialysis. CSW provided patient with [name] application. Patient was unsure if he had already completed the application or not, but says he had taken the [name] bus before. CSW explained to patient that if he wasn't already set up with [name], that he would need to complete the application. Patient indicated that he would have his daughter assist him with completing it. CSW contacted [name] office to set up transport for the patient's dialysis session tomorrow, if the patient was already set up with [name]. CSW left voicemail. CSW will check on setting up patient's dialysis transportation when [name] returns call. CSW provided the phone number to the patient to call and check on transportation as well.  Laveda Abbe, [place] Clinical Social Worker [phone]

## 2017-09-07 MED FILL — MIDODRINE HCL 5 MG TABLET: 5 | 15 days supply | Qty: 90 | Fill #0

## 2017-09-08 ENCOUNTER — Other Ambulatory Visit: Payer: Self-pay

## 2017-09-08 DIAGNOSIS — I0981 Rheumatic heart failure: Secondary | ICD-10-CM | POA: Diagnosis not present

## 2017-09-08 DIAGNOSIS — I50810 Right heart failure, unspecified: Secondary | ICD-10-CM | POA: Diagnosis not present

## 2017-09-08 NOTE — Patient Outreach (Signed)
[facility]) Care Management  09/08/17  Michael [name] 10/07/1961 [id number]  RNCM received voicemail from Bethena Roys, [place] with [facility] who stated that patient's home health nurse had called and stated that patient did not make it to his dialysis appointment yesterday, but they are unsure of the reason. She stated that the home health nurse has evaluated him and he is doing ok today despite missing his appointment yesterday.  RNCM contacted [name] office and verified that patient does have transportation arranged for dialysis appointment tomorrow.   RNCM left a voicemail for Darrell Jewel in customer service to verify if transportation was arranged on Monday 09/07/2017 and also to touch base regarding needs for patient to come into the office to complete paperwork in person. Requested callback so that RNCM can assist patient with coming in to get the needed paperwork completed so his transportation can continue. Awaiting callback to discuss.  RNCM attempted to reach patient without success. Left  HIPAA compliant voicemail with RNCM contact information and invited patient to callback. Will attempt to reach patient again via home visit and/or call to follow up and ensure he is going to his dialysis appointment.  RNCM notified Edwina, RN with [facility] of [name] transportation arranged for patient for tomorrow for his dialysis.  [place] R. Sheryle Vice, RN, BSN, CCM [facility] Care Management Coordinator [phone] '

## 2017-09-09 ENCOUNTER — Ambulatory Visit: Payer: Self-pay

## 2017-09-09 ENCOUNTER — Other Ambulatory Visit: Payer: Self-pay

## 2017-09-09 DIAGNOSIS — E119 Type 2 diabetes mellitus without complications: Secondary | ICD-10-CM | POA: Diagnosis not present

## 2017-09-09 DIAGNOSIS — E876 Hypokalemia: Secondary | ICD-10-CM | POA: Diagnosis not present

## 2017-09-09 DIAGNOSIS — Z992 Dependence on renal dialysis: Secondary | ICD-10-CM | POA: Diagnosis not present

## 2017-09-09 DIAGNOSIS — D509 Iron deficiency anemia, unspecified: Secondary | ICD-10-CM | POA: Diagnosis not present

## 2017-09-09 DIAGNOSIS — N186 End stage renal disease: Secondary | ICD-10-CM | POA: Diagnosis not present

## 2017-09-09 DIAGNOSIS — N2581 Secondary hyperparathyroidism of renal origin: Secondary | ICD-10-CM | POA: Diagnosis not present

## 2017-09-09 DIAGNOSIS — I482 Chronic atrial fibrillation: Secondary | ICD-10-CM | POA: Diagnosis not present

## 2017-09-09 NOTE — Patient Outreach (Signed)
[facility] [facility]) Care Management  09/09/17  Devian Ridener [birth year] [id number]  Successful outreach completed with patient. Patient identification verified.  Patient stated that he missed his dialysis appointment because his transportation did not show up.  Patient currently denies any shortness of breath or difficulty breathing, but did state that he has been vomiting during the night. He reported that he has not vomited this morning. Patient currently denies any needs or concerns at present and stated that he does have all of his medications. RNCM offered to come and complete home visit to check on patient since he was not feeling well during the night and he declined at present.  RNCM advised patient of his appointment today with dialysis and let him know that RNCM was able to verify that it is scheduled and they will be arriving to pick him up. Encouraged him to call RNCM if they did not arrive for some reason. Also advised patient that he needs to go to the [place] office to completed some paperwork in person by 09/16/2017 or he would no longer receive transportation services with [name]. Advised RNCM will help arrange the appointment and will go with him if needed. Patient asked when the appointment was and RNCM advised one has not been scheduled but offered to schedule and arrange for [name] to pick him up. Patient then handed the phone to his nephew, [name].  [name] stated that patient had called him on Monday to let him know that his transportation did not come to take him to his dialysis appointment, but he was not in town and was unable to take him. He stated that patient had been complaining this morning of stomach pains, and had been vomiting during the night.   [name] stated that patient does have a daughter that lives locally on [place], but he never sees her when he visits with his patient. He is not aware of how much support or assistance she is able to provide while in school.  He assists patient when he can. RNCM advised of need for patient to go to [name] office by 09/16/2017 to completed paperwork in person in order to continue to receive services. Advised that [name] can be arranged to come and pick him and and of RNCM's offer of assistance in getting the appointment and attending with him. [name] stated that he would take care of it and will take patient to the office. RNCM stressed the importance of completing this by 09/16/2017 and [name] verbalized understanding. [name] provided [facility] with his contact number [phone] and RNCM also provided contact information to him as well. Encouraged him to call if they have any difficulties getting to his dialysis appointment today and he verbalized understanding.     Plan: RNCM will follow up with patient within the next few days to evaluate if he is feeling better, and to ensure he gets to the [name] office.   [facility] CM Care Plan Problem One     Most Recent Value  Care Plan Problem One  Patient is at high risk for hospital readmission related to heart failure as evidenced by 4 inpatient admissions in last 6 months  Role Documenting the Problem One  Care Management [place] for Problem One  Active  [facility] Long Term Goal   Patient will not have a hospital admission within the next 31 days  [facility] Long Term Goal Start Date  09/09/17  Interventions for Problem One Long Term Goal  RNCM to assist patient with  arranging transportation to appointments, scheduling follow up appointments, collaborating with home health agency and providing education regaring importance of taking all medications as prescribed and attending all dialysis appointments  [facility] CM Short Term Goal #1   Patient will visit with the [place] office to completed necessary paperwork by 09/16/2017  [facility] CM Short Term Goal #1 Start Date  09/09/17  Interventions for Short Term Goal #1  RNCM provided instructions to patient on need to complete this or he will no longer be eligible  to receive transportation services through [name]. RNCM discussed this with a family member as well who stated he would take patient to complete this. RNCM to work with patient and family to ensure this is completed on time.  [facility] CM Short Term Goal #2   Patient will attend all dialysis appointments within the next 30 days  [facility] CM Short Term Goal #2 Start Date  09/09/17  Interventions for Short Term Goal #2  RNCM provided education on the importance of attending dialysis appointments, will provide instruction on how to call and schedule/check on status of his transportation via [name].       [place] R. Mattia Osterman, RN, BSN, [place] Management Coordinator [phone]

## 2017-09-10 ENCOUNTER — Ambulatory Visit: Payer: Self-pay

## 2017-09-10 DIAGNOSIS — I0981 Rheumatic heart failure: Secondary | ICD-10-CM | POA: Diagnosis not present

## 2017-09-10 DIAGNOSIS — I50810 Right heart failure, unspecified: Secondary | ICD-10-CM | POA: Diagnosis not present

## 2017-09-11 ENCOUNTER — Encounter: Payer: Medicare Other | Admitting: Vascular Surgery

## 2017-09-11 ENCOUNTER — Other Ambulatory Visit: Payer: Self-pay

## 2017-09-11 ENCOUNTER — Inpatient Hospital Stay (HOSPITAL_COMMUNITY): Admission: RE | Admit: 2017-09-11 | Payer: Medicare Other | Source: Ambulatory Visit

## 2017-09-11 DIAGNOSIS — N2581 Secondary hyperparathyroidism of renal origin: Secondary | ICD-10-CM | POA: Diagnosis not present

## 2017-09-11 DIAGNOSIS — E119 Type 2 diabetes mellitus without complications: Secondary | ICD-10-CM | POA: Diagnosis not present

## 2017-09-11 DIAGNOSIS — Z992 Dependence on renal dialysis: Secondary | ICD-10-CM | POA: Diagnosis not present

## 2017-09-11 DIAGNOSIS — E876 Hypokalemia: Secondary | ICD-10-CM | POA: Diagnosis not present

## 2017-09-11 DIAGNOSIS — D509 Iron deficiency anemia, unspecified: Secondary | ICD-10-CM | POA: Diagnosis not present

## 2017-09-11 DIAGNOSIS — N186 End stage renal disease: Secondary | ICD-10-CM | POA: Diagnosis not present

## 2017-09-11 NOTE — Patient Outreach (Signed)
[place] [facility]) Care Management  09/11/17  Michael [name] Jun 29, 1961 [id number]  Attempted to reach patient without success. Also attempted to reach patient's nephew, [name] without success. Left HIPAA compliant voicemails at both numbers with RNCM contact information and invited them to return call.  [place] R. [place], RN, BSN, [place] Management Coordinator [phone]

## 2017-09-12 DIAGNOSIS — N2581 Secondary hyperparathyroidism of renal origin: Secondary | ICD-10-CM | POA: Diagnosis not present

## 2017-09-12 DIAGNOSIS — D509 Iron deficiency anemia, unspecified: Secondary | ICD-10-CM | POA: Diagnosis not present

## 2017-09-12 DIAGNOSIS — Z992 Dependence on renal dialysis: Secondary | ICD-10-CM | POA: Diagnosis not present

## 2017-09-12 DIAGNOSIS — E876 Hypokalemia: Secondary | ICD-10-CM | POA: Diagnosis not present

## 2017-09-12 DIAGNOSIS — N186 End stage renal disease: Secondary | ICD-10-CM | POA: Diagnosis not present

## 2017-09-12 DIAGNOSIS — E119 Type 2 diabetes mellitus without complications: Secondary | ICD-10-CM | POA: Diagnosis not present

## 2017-09-14 DIAGNOSIS — Z992 Dependence on renal dialysis: Secondary | ICD-10-CM | POA: Diagnosis not present

## 2017-09-14 DIAGNOSIS — E119 Type 2 diabetes mellitus without complications: Secondary | ICD-10-CM | POA: Diagnosis not present

## 2017-09-14 DIAGNOSIS — D509 Iron deficiency anemia, unspecified: Secondary | ICD-10-CM | POA: Diagnosis not present

## 2017-09-14 DIAGNOSIS — N2581 Secondary hyperparathyroidism of renal origin: Secondary | ICD-10-CM | POA: Diagnosis not present

## 2017-09-14 DIAGNOSIS — N186 End stage renal disease: Secondary | ICD-10-CM | POA: Diagnosis not present

## 2017-09-14 DIAGNOSIS — E876 Hypokalemia: Secondary | ICD-10-CM | POA: Diagnosis not present

## 2017-09-14 DIAGNOSIS — I482 Chronic atrial fibrillation: Secondary | ICD-10-CM | POA: Diagnosis not present

## 2017-09-15 ENCOUNTER — Ambulatory Visit (INDEPENDENT_AMBULATORY_CARE_PROVIDER_SITE_OTHER): Payer: Medicare Other

## 2017-09-15 DIAGNOSIS — I059 Rheumatic mitral valve disease, unspecified: Secondary | ICD-10-CM

## 2017-09-15 DIAGNOSIS — I079 Rheumatic tricuspid valve disease, unspecified: Secondary | ICD-10-CM

## 2017-09-15 DIAGNOSIS — Z5181 Encounter for therapeutic drug level monitoring: Secondary | ICD-10-CM

## 2017-09-15 DIAGNOSIS — I50810 Right heart failure, unspecified: Secondary | ICD-10-CM | POA: Diagnosis not present

## 2017-09-15 DIAGNOSIS — I0981 Rheumatic heart failure: Secondary | ICD-10-CM | POA: Diagnosis not present

## 2017-09-15 LAB — POCT INR: INR: 1.6

## 2017-09-15 NOTE — Telephone Encounter (Signed)
This encounter was created in error - please disregard.

## 2017-09-16 DIAGNOSIS — Z992 Dependence on renal dialysis: Secondary | ICD-10-CM | POA: Diagnosis not present

## 2017-09-16 DIAGNOSIS — E876 Hypokalemia: Secondary | ICD-10-CM | POA: Diagnosis not present

## 2017-09-16 DIAGNOSIS — I509 Heart failure, unspecified: Secondary | ICD-10-CM | POA: Diagnosis not present

## 2017-09-16 DIAGNOSIS — E119 Type 2 diabetes mellitus without complications: Secondary | ICD-10-CM | POA: Diagnosis not present

## 2017-09-16 DIAGNOSIS — N186 End stage renal disease: Secondary | ICD-10-CM | POA: Diagnosis not present

## 2017-09-16 DIAGNOSIS — D509 Iron deficiency anemia, unspecified: Secondary | ICD-10-CM | POA: Diagnosis not present

## 2017-09-16 DIAGNOSIS — N2581 Secondary hyperparathyroidism of renal origin: Secondary | ICD-10-CM | POA: Diagnosis not present

## 2017-09-17 ENCOUNTER — Other Ambulatory Visit: Payer: Self-pay

## 2017-09-17 DIAGNOSIS — I0981 Rheumatic heart failure: Secondary | ICD-10-CM | POA: Diagnosis not present

## 2017-09-17 DIAGNOSIS — I50810 Right heart failure, unspecified: Secondary | ICD-10-CM | POA: Diagnosis not present

## 2017-09-18 ENCOUNTER — Emergency Department (HOSPITAL_COMMUNITY): Payer: Medicare Other

## 2017-09-18 ENCOUNTER — Other Ambulatory Visit: Payer: Self-pay

## 2017-09-18 ENCOUNTER — Other Ambulatory Visit (HOSPITAL_COMMUNITY): Payer: Self-pay

## 2017-09-18 ENCOUNTER — Encounter (HOSPITAL_COMMUNITY): Payer: Self-pay

## 2017-09-18 ENCOUNTER — Inpatient Hospital Stay (HOSPITAL_COMMUNITY)
Admission: EM | Admit: 2017-09-18 | Discharge: 2017-09-20 | DRG: 291 | Disposition: A | Discharge status: Home / Self Care | Payer: Medicare Other | Attending: Internal Medicine | Admitting: Internal Medicine

## 2017-09-18 DIAGNOSIS — D638 Anemia in other chronic diseases classified elsewhere: Secondary | ICD-10-CM | POA: Diagnosis not present

## 2017-09-18 DIAGNOSIS — I5022 Chronic systolic (congestive) heart failure: Secondary | ICD-10-CM | POA: Diagnosis not present

## 2017-09-18 DIAGNOSIS — I132 Hypertensive heart and chronic kidney disease with heart failure and with stage 5 chronic kidney disease, or end stage renal disease: Secondary | ICD-10-CM | POA: Diagnosis not present

## 2017-09-18 DIAGNOSIS — Z992 Dependence on renal dialysis: Secondary | ICD-10-CM

## 2017-09-18 DIAGNOSIS — Z952 Presence of prosthetic heart valve: Secondary | ICD-10-CM

## 2017-09-18 DIAGNOSIS — I50811 Acute right heart failure: Secondary | ICD-10-CM | POA: Diagnosis not present

## 2017-09-18 DIAGNOSIS — Z87891 Personal history of nicotine dependence: Secondary | ICD-10-CM

## 2017-09-18 DIAGNOSIS — E876 Hypokalemia: Secondary | ICD-10-CM | POA: Diagnosis not present

## 2017-09-18 DIAGNOSIS — E1122 Type 2 diabetes mellitus with diabetic chronic kidney disease: Secondary | ICD-10-CM | POA: Diagnosis present

## 2017-09-18 DIAGNOSIS — R0602 Shortness of breath: Secondary | ICD-10-CM

## 2017-09-18 DIAGNOSIS — N2581 Secondary hyperparathyroidism of renal origin: Secondary | ICD-10-CM | POA: Diagnosis not present

## 2017-09-18 DIAGNOSIS — K761 Chronic passive congestion of liver: Secondary | ICD-10-CM | POA: Diagnosis present

## 2017-09-18 DIAGNOSIS — I2729 Other secondary pulmonary hypertension: Secondary | ICD-10-CM | POA: Diagnosis present

## 2017-09-18 DIAGNOSIS — I2510 Atherosclerotic heart disease of native coronary artery without angina pectoris: Secondary | ICD-10-CM | POA: Diagnosis present

## 2017-09-18 DIAGNOSIS — Z8674 Personal history of sudden cardiac arrest: Secondary | ICD-10-CM

## 2017-09-18 DIAGNOSIS — I953 Hypotension of hemodialysis: Secondary | ICD-10-CM | POA: Diagnosis not present

## 2017-09-18 DIAGNOSIS — D696 Thrombocytopenia, unspecified: Secondary | ICD-10-CM | POA: Diagnosis not present

## 2017-09-18 DIAGNOSIS — E889 Metabolic disorder, unspecified: Secondary | ICD-10-CM | POA: Diagnosis present

## 2017-09-18 DIAGNOSIS — I480 Paroxysmal atrial fibrillation: Secondary | ICD-10-CM | POA: Diagnosis present

## 2017-09-18 DIAGNOSIS — R9431 Abnormal electrocardiogram [ECG] [EKG]: Secondary | ICD-10-CM | POA: Diagnosis not present

## 2017-09-18 DIAGNOSIS — I509 Heart failure, unspecified: Secondary | ICD-10-CM | POA: Diagnosis not present

## 2017-09-18 DIAGNOSIS — I1310 Hypertensive heart and chronic kidney disease without heart failure, with stage 1 through stage 4 chronic kidney disease, or unspecified chronic kidney disease: Secondary | ICD-10-CM | POA: Diagnosis not present

## 2017-09-18 DIAGNOSIS — Z79899 Other long term (current) drug therapy: Secondary | ICD-10-CM

## 2017-09-18 DIAGNOSIS — I482 Chronic atrial fibrillation: Secondary | ICD-10-CM | POA: Diagnosis present

## 2017-09-18 DIAGNOSIS — N186 End stage renal disease: Secondary | ICD-10-CM | POA: Diagnosis not present

## 2017-09-18 DIAGNOSIS — D6959 Other secondary thrombocytopenia: Secondary | ICD-10-CM | POA: Diagnosis present

## 2017-09-18 DIAGNOSIS — G4733 Obstructive sleep apnea (adult) (pediatric): Secondary | ICD-10-CM | POA: Diagnosis present

## 2017-09-18 DIAGNOSIS — Z794 Long term (current) use of insulin: Secondary | ICD-10-CM

## 2017-09-18 DIAGNOSIS — K219 Gastro-esophageal reflux disease without esophagitis: Secondary | ICD-10-CM | POA: Diagnosis present

## 2017-09-18 DIAGNOSIS — D631 Anemia in chronic kidney disease: Secondary | ICD-10-CM | POA: Diagnosis not present

## 2017-09-18 DIAGNOSIS — R0600 Dyspnea, unspecified: Secondary | ICD-10-CM | POA: Diagnosis present

## 2017-09-18 DIAGNOSIS — I9589 Other hypotension: Secondary | ICD-10-CM | POA: Diagnosis not present

## 2017-09-18 DIAGNOSIS — Z7901 Long term (current) use of anticoagulants: Secondary | ICD-10-CM

## 2017-09-18 DIAGNOSIS — R079 Chest pain, unspecified: Secondary | ICD-10-CM | POA: Diagnosis not present

## 2017-09-18 LAB — COMPREHENSIVE METABOLIC PANEL
ALK PHOS: 170 U/L — AB (ref 38–126)
ALT: 14 U/L — AB (ref 17–63)
ANION GAP: 11 (ref 5–15)
AST: 56 U/L — ABNORMAL HIGH (ref 15–41)
Albumin: 3.2 g/dL — ABNORMAL LOW (ref 3.5–5.0)
BILIRUBIN TOTAL: 3.7 mg/dL — AB (ref 0.3–1.2)
BUN: 18 mg/dL (ref 6–20)
CALCIUM: 8.9 mg/dL (ref 8.9–10.3)
CO2: 25 mmol/L (ref 22–32)
CREATININE: 3.74 mg/dL — AB (ref 0.61–1.24)
Chloride: 100 mmol/L — ABNORMAL LOW (ref 101–111)
GFR calc non Af Amer: 17 mL/min — ABNORMAL LOW (ref >60)
GFR, EST AFRICAN AMERICAN: 19 mL/min — AB (ref >60)
Glucose, Bld: 66 mg/dL (ref 65–99)
Potassium: 4.8 mmol/L (ref 3.5–5.1)
SODIUM: 136 mmol/L (ref 135–145)
TOTAL PROTEIN: 7.8 g/dL (ref 6.5–8.1)

## 2017-09-18 LAB — CBC WITH DIFFERENTIAL/PLATELET
BASOS PCT: 1 %
Basophils Absolute: 0.1 10*3/uL (ref 0.0–0.1)
EOS ABS: 0.2 10*3/uL (ref 0.0–0.7)
EOS PCT: 4 %
HCT: 37.1 % — ABNORMAL LOW (ref 39.0–52.0)
Hemoglobin: 11.9 g/dL — ABNORMAL LOW (ref 13.0–17.0)
Lymphocytes Relative: 31 %
Lymphs Abs: 1.8 10*3/uL (ref 0.7–4.0)
MCH: 25.8 pg — ABNORMAL LOW (ref 26.0–34.0)
MCHC: 32.1 g/dL (ref 30.0–36.0)
MCV: 80.3 fL (ref 78.0–100.0)
MONO ABS: 1 10*3/uL (ref 0.1–1.0)
MONOS PCT: 17 %
NEUTROS PCT: 47 %
Neutro Abs: 2.7 10*3/uL (ref 1.7–7.7)
Platelets: 50 10*3/uL — ABNORMAL LOW (ref 150–400)
RBC: 4.62 MIL/uL (ref 4.22–5.81)
RDW: 18.3 % — ABNORMAL HIGH (ref 11.5–15.5)
WBC: 5.8 10*3/uL (ref 4.0–10.5)

## 2017-09-18 LAB — I-STAT CHEM 8, ED
BUN: 20 mg/dL (ref 6–20)
CALCIUM ION: 0.99 mmol/L — AB (ref 1.15–1.40)
CREATININE: 3.7 mg/dL — AB (ref 0.61–1.24)
Chloride: 103 mmol/L (ref 101–111)
Glucose, Bld: 65 mg/dL (ref 65–99)
HCT: 43 % (ref 39.0–52.0)
Hemoglobin: 14.6 g/dL (ref 13.0–17.0)
Potassium: 4.3 mmol/L (ref 3.5–5.1)
SODIUM: 138 mmol/L (ref 135–145)
TCO2: 25 mmol/L (ref 22–32)

## 2017-09-18 LAB — I-STAT TROPONIN, ED: Troponin i, poc: 0 ng/mL (ref 0.00–0.08)

## 2017-09-18 LAB — MAGNESIUM: MAGNESIUM: 1.8 mg/dL (ref 1.7–2.4)

## 2017-09-18 LAB — AMMONIA: Ammonia: 65 umol/L — ABNORMAL HIGH (ref 9–35)

## 2017-09-18 LAB — PROTIME-INR
INR: 1.54
Prothrombin Time: 18.3 seconds — ABNORMAL HIGH (ref 11.4–15.2)

## 2017-09-18 MED ORDER — DOCUSATE SODIUM 100 MG PO CAPS
100.0000 mg | ORAL_CAPSULE | Freq: Two times a day (BID) | ORAL | Status: DC
  Administered 2017-09-19 – 2017-09-20 (×3): 100 mg via ORAL
  Filled 2017-09-18 (×2): qty 1

## 2017-09-18 MED ORDER — CALCIUM ACETATE (PHOS BINDER) 667 MG PO CAPS
667.0000 mg | ORAL_CAPSULE | Freq: Three times a day (TID) | ORAL | Status: DC
Start: 2017-09-19 — End: 2017-09-19
  Administered 2017-09-19: 667 mg via ORAL
  Filled 2017-09-18: qty 1

## 2017-09-18 MED ORDER — ACETAMINOPHEN 650 MG RE SUPP: 650.0000 mg | Freq: Four times a day (QID) | RECTAL | Status: DC | PRN

## 2017-09-18 MED ORDER — METOPROLOL SUCCINATE ER 25 MG PO TB24
25.0000 mg | ORAL_TABLET | Freq: Two times a day (BID) | ORAL | Status: DC
  Administered 2017-09-20 (×2): 25 mg via ORAL
  Filled 2017-09-18 (×2): qty 1

## 2017-09-18 MED ORDER — ACETAMINOPHEN 325 MG PO TABS
650.0000 mg | ORAL_TABLET | Freq: Four times a day (QID) | ORAL | Status: DC | PRN
  Administered 2017-09-19: 650 mg via ORAL
  Filled 2017-09-18 (×3): qty 2

## 2017-09-18 MED ORDER — SENNOSIDES-DOCUSATE SODIUM 8.6-50 MG PO TABS: 1.0000 | ORAL_TABLET | Freq: Every evening | ORAL | Status: DC | PRN

## 2017-09-18 MED ORDER — ALLOPURINOL 300 MG PO TABS
150.0000 mg | ORAL_TABLET | Freq: Every day | ORAL | Status: DC
  Administered 2017-09-19 – 2017-09-20 (×2): 150 mg via ORAL
  Filled 2017-09-18 (×2): qty 1

## 2017-09-18 MED ORDER — MIDODRINE HCL 5 MG PO TABS
10.0000 mg | ORAL_TABLET | Freq: Three times a day (TID) | ORAL | Status: DC
  Administered 2017-09-19 – 2017-09-20 (×4): 10 mg via ORAL
  Filled 2017-09-18 (×4): qty 2

## 2017-09-18 MED ORDER — PANTOPRAZOLE SODIUM 40 MG PO TBEC
40.0000 mg | DELAYED_RELEASE_TABLET | Freq: Every day | ORAL | Status: DC
  Administered 2017-09-19 – 2017-09-20 (×2): 40 mg via ORAL
  Filled 2017-09-18 (×2): qty 1

## 2017-09-18 MED ORDER — ATORVASTATIN CALCIUM 20 MG PO TABS
20.0000 mg | ORAL_TABLET | Freq: Every day | ORAL | Status: DC
  Administered 2017-09-20: 20 mg via ORAL
  Filled 2017-09-18 (×2): qty 1

## 2017-09-18 MED ORDER — WARFARIN - PHARMACIST DOSING INPATIENT: Freq: Every day | Status: DC

## 2017-09-18 MED ORDER — HEPARIN SODIUM (PORCINE) 5000 UNIT/ML IJ SOLN: 5000.0000 [IU] | Freq: Three times a day (TID) | INTRAMUSCULAR | Status: DC

## 2017-09-18 MED ORDER — FOLIC ACID 1 MG PO TABS
1.0000 mg | ORAL_TABLET | Freq: Every day | ORAL | Status: DC
  Administered 2017-09-19 – 2017-09-20 (×2): 1 mg via ORAL
  Filled 2017-09-18 (×2): qty 1

## 2017-09-18 MED ORDER — ACETAMINOPHEN-CODEINE #3 300-30 MG PO TABS: 1.0000 | ORAL_TABLET | Freq: Two times a day (BID) | ORAL | Status: DC | PRN

## 2017-09-18 MED ORDER — WARFARIN SODIUM 5 MG PO TABS
5.0000 mg | ORAL_TABLET | Freq: Once | ORAL | Status: AC
  Administered 2017-09-18: 5 mg via ORAL
  Filled 2017-09-18: qty 1

## 2017-09-18 MED ORDER — COLCHICINE 0.6 MG PO TABS
1.2000 mg | ORAL_TABLET | Freq: Every day | ORAL | Status: DC
  Administered 2017-09-19 – 2017-09-20 (×2): 1.2 mg via ORAL
  Filled 2017-09-18 (×2): qty 2

## 2017-09-18 NOTE — ED Triage Notes (Signed)
Pt arrived via [facility] from dialysis, pt finished 45 minutes of treatment before c/o SOB and ill feeling.  EMS gave 523ml NS for hypotension in route.

## 2017-09-18 NOTE — Telephone Encounter (Signed)
This encounter was created in error - please disregard.

## 2017-09-18 NOTE — Consult Note (Signed)
[facility] Renal Consultation Note    Indication for Consultation:  Management of ESRD/hemodialysis, anemia, hypertension/volume, and secondary hyperparathyroidism. PCP:  HPI: Michael [name] is a 56 y.o. male with ESRD, HFrEF (50-55%), CAD (s/p Hx multiple cardiac arrest), GERD, OSA, and Type 2 DM is being admitted for dyspnea/volume overload.  Last discharged from [facility] 10/21 with volume overload. Has been dialyzing 4 days per week since that time, coming in below his EDW with minimal UF. Looks like EDW has not been challenged/adjusted unfortunately. Today, presented to his outpatient HD center with severe SOB and chest pressure. Oxygen applied without relief, so he was sent to [facility] ED. Was hypotensive and given 537m NS by EMS. In ED, CXR showed pulmonary edema. Labs with K 4.8, Hgb 11.9, troponin 0.  Currently, he looks comfortable. Breathing on room air. Chest pressure and dyspnea are improved. No fever or chills. No abdominal complaints. [facility]. Uses TDC while L AVF matures.  Past Medical History:  Diagnosis Date  . Anemia of chronic disease   . Asthma    said he does not know  . Chronic kidney disease (CKD), stage IV (severe) ([place]   . Chronic systolic CHF (congestive heart failure) (HCC)    a. prior EF normal; b. Echo 10/16: Mild LVH, EF 30-35%, mitral valve bioprosthesis present without evidence of stenosis or regurgitation, severe LAE, mild RVE with mild to moderately reduced RVSF, moderate RAE  . Coronary artery disease   . Diabetes mellitus type 2 in obese ([place]   . Diabetes mellitus without complication ([place]   . Dyspnea   . GERD (gastroesophageal reflux disease)   . H/O tricuspid valve repair 2012  . History of echocardiogram    [facility] mod LVH, EF 60-65%, mod LAE, tissue MVR ok, mod TR, mod pulmo HTN with RVSP 48 mmHg  . Hypertension   . Obstructive sleep apnea   . Paroxysmal atrial  fibrillation (HCC)    s/p Maze procedure at time of MVR in 2011  . Pulmonary HTN ([place]   . S/P mitral valve replacement with porcine valve 2012   Past Surgical History:  Procedure Laterality Date  . AV FISTULA PLACEMENT Left 07/13/2017   Procedure: LEFT ARM ARTERIOVENOUS (AV) FISTULA CREATION;  Surgeon: CWaynetta Sandy MD;  [place]  Service: Vascular;  Laterality: Left;  . CARDIAC SURGERY    . CARDIAC VALVE REPLACEMENT  2011  . CARDIOVERSION N/A 07/09/2017   Procedure: CARDIOVERSION;  Surgeon: MLarey Dresser MD;  [facility];  Service: Cardiovascular;  Laterality: N/A;  . COLONOSCOPY N/A 07/16/2017   Procedure: COLONOSCOPY;  Surgeon: PJerene Bears MD;  [place]: [place]  Service: Gastroenterology;  Laterality: N/A;  . ESOPHAGOGASTRODUODENOSCOPY N/A 07/16/2017   Procedure: ESOPHAGOGASTRODUODENOSCOPY (EGD);  Surgeon: PJerene Bears MD;  [facility];  Service: Gastroenterology;  Laterality: N/A;  . INSERTION OF DIALYSIS CATHETER Right 07/13/2017   Procedure: INSERTION OF DIALYSIS CATHETER RIGHT INTERNAL JUGULAR;  Surgeon: CWaynetta Sandy MD;  [place]  Service: Vascular;  Laterality: Right;  . RIGHT HEART CATH N/A 06/29/2017   Procedure: RIGHT HEART CATH;  Surgeon: MLarey Dresser MD;  [facility];  Service: Cardiovascular;  Laterality: N/A;  . RIGHT/LEFT HEART CATH AND CORONARY ANGIOGRAPHY N/A 08/19/2017   Procedure: RIGHT/LEFT HEART CATH AND CORONARY ANGIOGRAPHY;  Surgeon: HLeonie Man MD;  [place];  Service: Cardiovascular;  Laterality: N/A;  . TEE WITHOUT CARDIOVERSION N/A 07/09/2017   Procedure: TRANSESOPHAGEAL ECHOCARDIOGRAM (TEE);  Surgeon: Larey Dresser, MD;  Location[facility];  Service: Cardiovascular;  Laterality: N/A;   Family History  Problem Relation Age of Onset  . Heart attack Neg Hx    Social History:  reports that he has quit smoking. His smoking use  included Cigarettes. He smoked 1.00 pack per day. He has quit using smokeless tobacco. He reports that he does not drink alcohol or use drugs.  ROS: As per HPI otherwise negative.  Physical Exam: Vitals:   09/18/17 1336 09/18/17 1340 09/18/17 1527 09/18/17 1530  BP:  100/71  103/84  Pulse:  (!) 110  98  Resp:  17  20  Temp:  97.7 F (36.5 C)    TempSrc:  Oral    SpO2: 99% 96% 96% 97%     General: Well developed, well nourished, in no acute distress. + facial edema. Head: Normocephalic, atraumatic, sclera non-icteric, mucus membranes are moist. Neck: Supple without lymphadenopathy/masses. + JVD Lungs: Diminished breath sounds in B bases with rales Heart: RRR , 2/6 murmur. + heaving cardiac pulsations. Abdomen: Soft, non-tender Musculoskeletal:  Strength and tone appear normal for age. Lower extremities: No LE edema, no ischemic changes, no open wounds. Neuro: Alert and oriented X 3. Moves all extremities spontaneously. Psych:  Responds to questions appropriately with a normal affect. Dialysis Access: TDC in R chest, LUE AVF + thrill (maturing)  No Known Allergies Prior to Admission medications   Medication Sig Start Date End Date Taking? Authorizing Provider  ACCU-CHEK SOFTCLIX LANCETS lancets USE AS INSTRUCTED 3 TIMES DAILY 08/04/17   [name], MD  acetaminophen (TYLENOL) 325 MG tablet Take 650 mg by mouth every 6 (six) hours.    [provider]  acetaminophen-codeine (TYLENOL #3) 300-30 MG tablet Take 1 tablet by mouth every 12 (twelve) hours as needed for moderate pain. Patient taking differently: Take 1 tablet by mouth 2 (two) times daily.  08/04/17   [name], MD  allopurinol (ZYLOPRIM) 300 MG tablet Take 0.5 tablets (150 mg total) by mouth daily. 07/28/17   Shirley Friar, PA-C  atorvastatin (LIPITOR) 20 MG tablet Take 1 tablet (20 mg total) by mouth daily. 07/28/17   Shirley Friar, PA-C  Blood Glucose Monitoring Suppl (ACCU-CHEK AVIVA PLUS)  w/Device KIT USE AS INSTRUCTED 3 TIMES DAILY. E11.9 08/31/17   [name], MD  calcium acetate (PHOSLO) 667 MG capsule Take 1 capsule (667 mg total) by mouth 3 (three) times daily with meals. Patient taking differently: Take 667-2,001 mg by mouth See admin instructions. Pt takes 2,041m by mouth three times daily and 6640mtwice daily with snacks 07/27/17   TiShirley FriarPA-C  cephALEXin (KEFLEX) 500 MG capsule Take 500 mg by mouth at bedtime. 08/25/17   [provider]  colchicine 0.6 MG tablet Take 1.2 mg by mouth daily.  06/02/17   [provider]  Darbepoetin Alfa (ARANESP) 200 MCG/0.4ML SOSY injection Inject 0.4 mLs (200 mcg total) into the skin every Saturday at 6 PM. 08/01/17   Tillery, MiSatira MccallumPA-C  diclofenac sodium (VOLTAREN) 1 % GEL Apply 4 g topically 4 (four) times daily.    [provider]  docusate sodium (COLACE) 100 MG capsule Take 1 capsule (100 mg total) by mouth 2 (two) times daily. 07/27/17   TiShirley FriarPA-C  folic acid (FOLVITE) 1 MG tablet TAKE 1 TABLET BY MOUTH DAILY. Patient taking differently: TAKE  1MG BY MOUTH ONCE DAILY 04/20/17   [name], MD  glucose blood (ACCU-CHEK AVIVA PLUS) test strip USE 3 TIMES DAILY AS DIRECTED. 08/04/17   [name], MD  hydrOXYzine (ATARAX/VISTARIL) 10 MG tablet Take 10 mg by mouth every 6 (six) hours as needed for itching or anxiety. 07/28/17   [provider]  metoprolol succinate (TOPROL-XL) 25 MG 24 hr tablet Take 1 tablet (25 mg total) by mouth 2 (two) times daily. Patient taking differently: Take 25 mg by mouth daily.  07/27/17   Shirley Friar, PA-C  midodrine (PROAMATINE) 5 MG tablet Take 2 tablets (10 mg total) by mouth 3 (three) times daily with meals. 09/06/17   Kalman Shan Ratliff, DO  omeprazole (PRILOSEC) 20 MG capsule Take 1 capsule (20 mg total) by mouth daily. 03/03/17   [name], MD  OXYGEN Inhale 2 L into the lungs daily.    [provider]  tiZANidine (ZANAFLEX) 4 MG tablet Take 1 tablet (4 mg total) by mouth every 8 (eight) hours as needed for muscle spasms. 08/04/17   [name], MD  warfarin (COUMADIN) 2.5 MG tablet Take 1 tablet (2.5 mg total) by mouth daily at 6 PM. Or as directed by coumadin clinic 07/27/17   Shirley Friar, PA-C   No current facility-administered medications for this encounter.    Current Outpatient Prescriptions  Medication Sig Dispense Refill  . ACCU-CHEK SOFTCLIX LANCETS lancets USE AS INSTRUCTED 3 TIMES DAILY 100 each 5  . acetaminophen (TYLENOL) 325 MG tablet Take 650 mg by mouth every 6 (six) hours.    Marland Kitchen acetaminophen-codeine (TYLENOL #3) 300-30 MG tablet Take 1 tablet by mouth every 12 (twelve) hours as needed for moderate pain. (Patient taking differently: Take 1 tablet by mouth 2 (two) times daily. ) 40 tablet 1  . allopurinol (ZYLOPRIM) 300 MG tablet Take 0.5 tablets (150 mg total) by mouth daily. 15 tablet 3  . atorvastatin (LIPITOR) 20 MG tablet Take 1 tablet (20 mg total) by mouth daily. 30 tablet 3  . Blood Glucose Monitoring Suppl (ACCU-CHEK AVIVA PLUS) w/Device KIT USE AS INSTRUCTED 3 TIMES DAILY. E11.9 1 kit 0  . calcium acetate (PHOSLO) 667 MG capsule Take 1 capsule (667 mg total) by mouth 3 (three) times daily with meals. (Patient taking differently: Take 667-2,001 mg by mouth See admin instructions. Pt takes 2,075m by mouth three times daily and 6688mtwice daily with snacks) 90 capsule 3  . cephALEXin (KEFLEX) 500 MG capsule Take 500 mg by mouth at bedtime.  0  . colchicine 0.6 MG tablet Take 1.2 mg by mouth daily.   0  . Darbepoetin Alfa (ARANESP) 200 MCG/0.4ML SOSY injection Inject 0.4 mLs (200 mcg total) into the skin every Saturday at 6 PM. 1.68 mL   . diclofenac sodium (VOLTAREN) 1 % GEL Apply 4 g topically 4 (four) times daily.    . Marland Kitchenocusate sodium (COLACE) 100 MG capsule Take 1 capsule (100 mg total) by mouth 2 (two) times daily. 10 capsule 0  . folic  acid (FOLVITE) 1 MG tablet TAKE 1 TABLET BY MOUTH DAILY. (Patient taking differently: TAKE 1MG BY MOUTH ONCE DAILY) 30 tablet 2  . glucose blood (ACCU-CHEK AVIVA PLUS) test strip USE 3 TIMES DAILY AS DIRECTED. 100 each 12  . hydrOXYzine (ATARAX/VISTARIL) 10 MG tablet Take 10 mg by mouth every 6 (six) hours as needed for itching or anxiety.  0  . metoprolol succinate (TOPROL-XL) 25 MG 24 hr tablet Take 1 tablet (  25 mg total) by mouth 2 (two) times daily. (Patient taking differently: Take 25 mg by mouth daily. ) 60 tablet 6  . midodrine (PROAMATINE) 5 MG tablet Take 2 tablets (10 mg total) by mouth 3 (three) times daily with meals. 90 tablet 2  . omeprazole (PRILOSEC) 20 MG capsule Take 1 capsule (20 mg total) by mouth daily. 30 capsule 5  . OXYGEN Inhale 2 L into the lungs daily.    Marland Kitchen tiZANidine (ZANAFLEX) 4 MG tablet Take 1 tablet (4 mg total) by mouth every 8 (eight) hours as needed for muscle spasms. 60 tablet 1  . warfarin (COUMADIN) 2.5 MG tablet Take 1 tablet (2.5 mg total) by mouth daily at 6 PM. Or as directed by coumadin clinic 30 tablet 0   Labs: Basic Metabolic Panel:  Recent Labs Lab 09/18/17 1538 09/18/17 1547  NA 136 138  K 4.8 4.3  CL 100* 103  CO2 25  --   GLUCOSE 66 65  BUN 18 20  CREATININE 3.74* 3.70*  CALCIUM 8.9  --    Liver Function Tests:  Recent Labs Lab 09/18/17 1538  AST 56*  ALT 14*  ALKPHOS 170*  BILITOT 3.7*  PROT 7.8  ALBUMIN 3.2*    Recent Labs Lab 09/18/17 1538  AMMONIA 65*   CBC:  Recent Labs Lab 09/18/17 1538 09/18/17 1547  WBC 5.8  --   NEUTROABS 2.7  --   HGB 11.9* 14.6  HCT 37.1* 43.0  MCV 80.3  --   PLT 50*  --    Studies/Results: Dg Chest Portable 1 View  Result Date: 09/18/2017 CLINICAL DATA:  Shortness of breath. EXAM: PORTABLE CHEST 1 VIEW COMPARISON:  09/02/2017 chest radiograph FINDINGS: Stable cardiomegaly. Small moderate bilateral pleural effusions. Reticular opacities and peripheral lines compatible with  interstitial edema. Right central venous catheter tip projects over mid SVC. Aortic atherosclerosis with calcification. Bones are unremarkable. IMPRESSION: Stable cardiomegaly. Small to moderate bilateral pleural effusions. Interstitial pulmonary edema. Electronically Signed   By: Kristine Garbe M.D.   On: 09/18/2017 14:39    Dialysis Orders:  [facility] at [facility], BFR 300, DFR 800, 3K/2.25Ca, EDW 73kg, TDC, Heparin 4200 bolus - Mircera 174mg IV q 2 weeks  Assessment/Plan: 1.  Dyspnea/pulmonary edema: Will need HD tonight and again tomorrow for volume correction. 2.  ESRD:  See above. HD today and tomorrow, per usual MWFS schedule.  3.  Hypotension/volume: Chronic hypotension/CHF. Needs EDW reduced. 4.  Anemia: Hgb 14.6, no ESA needed. 5.  Metabolic bone disease: Ca ok, continue home binder. 6.  CAD (Hx cardiac arrest) 7. Type 2 DM 8. HFrEF (50-55% per 07/2017 echo)  KVeneta Penton PA-C 09/18/2017, 4:43 PM  [facility] Pager: [phone]

## 2017-09-18 NOTE — Progress Notes (Signed)
Called ER RN  for report. RN busy.

## 2017-09-18 NOTE — Patient Outreach (Signed)
[facility] [facility]) Care Management  09/17/2017  Khris Quintanilla [birth year] [id number]   Successful outreach completed with [name]. Spoke with customer service and was able to receive one last extension of his [name] services until November 30. However, if patient does not go to the office for a face to face interview with them, they will no longer approve services for him as they have been trying for months to get this done and he has not arrived for his scheduled appointments. RNCM scheduled an appointment for his interview - 10/06/2017 (first available) and arranged for [name] to pick him up. RNCM to ensure that patient gets to this appointment.  RNCM received callback from patient's nephew [name], who stated he was unable to take patient to the [name] office, but had arranged for someone else to do so and he was not aware that he had not gone. He was given the appointment information and stated that with this much notice, he can make sure he is off work and will go with patient to his appointment.  RNCM attempted to reach patient without success. Left HIPAA compliant voicemail with RNCM contact information and invited patient to return call.  [place] R. Faizah Kandler, RN, BSN, [place] Management Coordinator [phone]

## 2017-09-18 NOTE — Progress Notes (Signed)
ANTICOAGULATION CONSULT NOTE - Initial Consult  Pharmacy Consult for Coumadin Indication: atrial fibrillation  No Known Allergies  Patient Measurements:    Vital Signs: Temp: 97.7 F (36.5 C) (11/02 1340) Temp Source: Oral (11/02 1340) BP: 100/81 (11/02 1630) Pulse Rate: 116 (11/02 1630)  Labs:  Recent Labs  09/18/17 1538 09/18/17 1547  HGB 11.9* 14.6  HCT 37.1* 43.0  PLT 50*  --   LABPROT 18.3*  --   INR 1.54  --   CREATININE 3.74* 3.70*    Estimated Creatinine Clearance: 21.2 mL/min (A) (by C-G formula based on SCr of 3.7 mg/dL (H)).   Medical History: Past Medical History:  Diagnosis Date  . Anemia of chronic disease   . Asthma    said he does not know  . Chronic kidney disease (CKD), stage IV (severe) ([place])   . Chronic systolic CHF (congestive heart failure) (HCC)    a. prior EF normal; b. Echo 10/16: Mild LVH, EF 30-35%, mitral valve bioprosthesis present without evidence of stenosis or regurgitation, severe LAE, mild RVE with mild to moderately reduced RVSF, moderate RAE  . Coronary artery disease   . Diabetes mellitus type 2 in obese ([place])   . Diabetes mellitus without complication ([place])   . Dyspnea   . GERD (gastroesophageal reflux disease)   . H/O tricuspid valve repair 2012  . History of echocardiogram    Echo [facility] 4/12: mod LVH, EF 60-65%, mod LAE, tissue MVR ok, mod TR, mod pulmo HTN with RVSP 48 mmHg  . Hypertension   . Obstructive sleep apnea   . Paroxysmal atrial fibrillation (HCC)    s/p Maze procedure at time of MVR in 2011  . Pulmonary HTN ([place])   . S/P mitral valve replacement with porcine valve 2012    Medications:  Coumadin 5mg  X1   Assessment: Patient is 56YO Male, Coumadin for Afib   INR subtherapeutic at 1.54, platelets 50 (chronic), No active bleeding   Goal of Therapy:  INR 2-3     Plan:  Warfarin 5mg  X1  F/U monitor daily INR, any active bleeding   Michael [name] 09/18/2017,5:15 PM

## 2017-09-18 NOTE — ED Provider Notes (Signed)
[facility] EMERGENCY DEPARTMENT Provider Note   CSN: [id number] Arrival date & time: 09/18/17  1340     History   Chief Complaint Chief Complaint  Patient presents with  . Shortness of Breath    HPI Michael [name] is a 56 y.o. male.  The history is provided by the patient and medical records. No language interpreter was used.  Shortness of Breath  This is a recurrent problem. The average episode lasts 1 day. The problem occurs continuously.The current episode started 12 to 24 hours ago. The problem has not changed since onset.Pertinent negatives include no fever, no headaches, no rhinorrhea, no neck pain, no cough, no sputum production, no hemoptysis, no wheezing, no chest pain, no syncope, no vomiting, no abdominal pain, no rash, no leg pain and no leg swelling. He has tried nothing for the symptoms. He has had prior hospitalizations. Associated medical issues include heart failure.    Past Medical History:  Diagnosis Date  . Anemia of chronic disease   . Asthma    said he does not know  . Chronic kidney disease (CKD), stage IV (severe) ([place])   . Chronic systolic CHF (congestive heart failure) (HCC)    a. prior EF normal; b. Echo 10/16: Mild LVH, EF 30-35%, mitral valve bioprosthesis present without evidence of stenosis or regurgitation, severe LAE, mild RVE with mild to moderately reduced RVSF, moderate RAE  . Coronary artery disease   . Diabetes mellitus type 2 in obese ([place])   . Diabetes mellitus without complication ([place])   . Dyspnea   . GERD (gastroesophageal reflux disease)   . H/O tricuspid valve repair 2012  . History of echocardiogram    Echo at [facility] in [facility] 4/12: mod LVH, EF 60-65%, mod LAE, tissue MVR ok, mod TR, mod pulmo HTN with RVSP 48 mmHg  . Hypertension   . Obstructive sleep apnea   . Paroxysmal atrial fibrillation (HCC)    s/p Maze procedure at time of MVR in 2011  . Pulmonary HTN ([place])   . S/P mitral valve replacement with  porcine valve 2012    Patient Active Problem List   Diagnosis Date Noted  . Chronic renal failure   . Advance care planning   . Pulmonary hypertension, unspecified ([place])   . Chest pain   . Biventricular heart failure ([place]) 08/10/2017  . Syncope and collapse 08/10/2017  . Cardiac arrest ([place])   . Benign neoplasm of ascending colon   . Rectal bleeding   . ESRD (end stage renal disease) on dialysis ([place])   . Goals of care, counseling/discussion   . Palliative care by specialist   . Acute kidney injury ([place]) 06/25/2017  . Anasarca 05/30/2017  . HLD (hyperlipidemia) 10/05/2016  . Mouth bleeding 10/05/2016  . Bilateral knee pain 10/05/2016  . Petechial rash 10/05/2016  . Laceration of tongue   . Insomnia 06/10/2016  . Supratherapeutic INR 06/01/2016  . Acute hepatic encephalopathy 03/15/2016  . Liver cirrhosis ([place]) 03/15/2016  . Volume overload 03/15/2016  . Acute congestive heart failure ([place])   . Insulin dependent diabetes mellitus ([place])   . Acute renal failure (ARF) ([place]) 03/14/2016  . GERD (gastroesophageal reflux disease) 03/04/2016  . Aortic regurgitation 02/27/2016  . Viral gastroenteritis 02/27/2016  . A-fib ([place]) 02/26/2016  . Abdominal pain 02/25/2016  . Diabetes mellitus with complication ([place]) 26/71/2458  . Permanent atrial fibrillation ([place]) 02/25/2016  . Anemia, chronic disease 02/25/2016  . Pain in joint, ankle and foot 01/10/2016  .  Asthma   . Acute hypoxemic respiratory failure ([place]) 12/29/2015  . Acute respiratory failure ([place]) 12/29/2015  . Chronic atrial fibrillation ([place]) 12/24/2015  . Dilated cardiomyopathy ([place]) 12/24/2015  . Polypharmacy 11/23/2015  . Gout 10/23/2015  . Encounter for therapeutic drug monitoring 10/19/2015  . Anemia - mild exacerbation 10/15/2015  . Tricuspid valve disease 10/09/2015  . Mitral valve disease   . CKD (chronic kidney disease), stage IV ([place]) 09/10/2015  . Calf pain 09/10/2015  . DM type 2 (diabetes mellitus, type  2) ([place]) 09/10/2015  . Essential hypertension 09/10/2015    Past Surgical History:  Procedure Laterality Date  . AV FISTULA PLACEMENT Left 07/13/2017   Procedure: LEFT ARM ARTERIOVENOUS (AV) FISTULA CREATION;  Surgeon: Waynetta Sandy, MD;  Location: [facility];  Service: Vascular;  Laterality: Left;  . CARDIAC SURGERY    . CARDIAC VALVE REPLACEMENT  2011  . CARDIOVERSION N/A 07/09/2017   Procedure: CARDIOVERSION;  Surgeon: Larey Dresser, MD;  Location: [facility] ENDOSCOPY;  Service: Cardiovascular;  Laterality: N/A;  . COLONOSCOPY N/A 07/16/2017   Procedure: COLONOSCOPY;  Surgeon: Jerene Bears, MD;  Location: [facility];  Service: Gastroenterology;  Laterality: N/A;  . ESOPHAGOGASTRODUODENOSCOPY N/A 07/16/2017   Procedure: ESOPHAGOGASTRODUODENOSCOPY (EGD);  Surgeon: Jerene Bears, MD;  Location: [facility] ENDOSCOPY;  Service: Gastroenterology;  Laterality: N/A;  . INSERTION OF DIALYSIS CATHETER Right 07/13/2017   Procedure: INSERTION OF DIALYSIS CATHETER RIGHT INTERNAL JUGULAR;  Surgeon: Waynetta Sandy, MD;  Location: [facility];  Service: Vascular;  Laterality: Right;  . RIGHT HEART CATH N/A 06/29/2017   Procedure: RIGHT HEART CATH;  Surgeon: Larey Dresser, MD;  Location: [facility] CV LAB;  Service: Cardiovascular;  Laterality: N/A;  . RIGHT/LEFT HEART CATH AND CORONARY ANGIOGRAPHY N/A 08/19/2017   Procedure: RIGHT/LEFT HEART CATH AND CORONARY ANGIOGRAPHY;  Surgeon: Leonie Man, MD;  Location: [facility] CV LAB;  Service: Cardiovascular;  Laterality: N/A;  . TEE WITHOUT CARDIOVERSION N/A 07/09/2017   Procedure: TRANSESOPHAGEAL ECHOCARDIOGRAM (TEE);  Surgeon: Larey Dresser, MD;  Location: [facility] ENDOSCOPY;  Service: Cardiovascular;  Laterality: N/A;       Home Medications    Prior to Admission medications   Medication Sig Start Date End Date Taking? Authorizing Provider  ACCU-CHEK SOFTCLIX LANCETS lancets USE AS INSTRUCTED 3 TIMES DAILY 08/04/17   [name], MD    acetaminophen (TYLENOL) 325 MG tablet Take 650 mg by mouth every 6 (six) hours.    [provider]  acetaminophen-codeine (TYLENOL #3) 300-30 MG tablet Take 1 tablet by mouth every 12 (twelve) hours as needed for moderate pain. Patient taking differently: Take 1 tablet by mouth 2 (two) times daily.  08/04/17   [name], MD  allopurinol (ZYLOPRIM) 300 MG tablet Take 0.5 tablets (150 mg total) by mouth daily. 07/28/17   Shirley Friar, PA-C  atorvastatin (LIPITOR) 20 MG tablet Take 1 tablet (20 mg total) by mouth daily. 07/28/17   Shirley Friar, PA-C  Blood Glucose Monitoring Suppl (ACCU-CHEK AVIVA PLUS) w/Device KIT USE AS INSTRUCTED 3 TIMES DAILY. E11.9 08/31/17   [name], MD  calcium acetate (PHOSLO) 667 MG capsule Take 1 capsule (667 mg total) by mouth 3 (three) times daily with meals. Patient taking differently: Take 667-2,001 mg by mouth See admin instructions. Pt takes 2,026m by mouth three times daily and 669mtwice daily with snacks 07/27/17   TiShirley FriarPA-C  cephALEXin (KEFLEX) 500 MG capsule Take 500 mg by mouth at bedtime. 08/25/17  [provider]  colchicine 0.6 MG tablet Take 1.2 mg by mouth daily.  06/02/17   [provider]  Darbepoetin Alfa (ARANESP) 200 MCG/0.4ML SOSY injection Inject 0.4 mLs (200 mcg total) into the skin every Saturday at 6 PM. 08/01/17   Tillery, Satira Mccallum, PA-C  diclofenac sodium (VOLTAREN) 1 % GEL Apply 4 g topically 4 (four) times daily.    [provider]  docusate sodium (COLACE) 100 MG capsule Take 1 capsule (100 mg total) by mouth 2 (two) times daily. 07/27/17   Shirley Friar, PA-C  folic acid (FOLVITE) 1 MG tablet TAKE 1 TABLET BY MOUTH DAILY. Patient taking differently: TAKE 1MG BY MOUTH ONCE DAILY 04/20/17   [name], MD  glucose blood (ACCU-CHEK AVIVA PLUS) test strip USE 3 TIMES DAILY AS DIRECTED. 08/04/17   [name], MD  hydrOXYzine (ATARAX/VISTARIL) 10  MG tablet Take 10 mg by mouth every 6 (six) hours as needed for itching or anxiety. 07/28/17   [provider]  metoprolol succinate (TOPROL-XL) 25 MG 24 hr tablet Take 1 tablet (25 mg total) by mouth 2 (two) times daily. Patient taking differently: Take 25 mg by mouth daily.  07/27/17   Shirley Friar, PA-C  midodrine (PROAMATINE) 5 MG tablet Take 2 tablets (10 mg total) by mouth 3 (three) times daily with meals. 09/06/17   Kalman Shan Ratliff, DO  omeprazole (PRILOSEC) 20 MG capsule Take 1 capsule (20 mg total) by mouth daily. 03/03/17   [name], MD  OXYGEN Inhale 2 L into the lungs daily.    [provider]  tiZANidine (ZANAFLEX) 4 MG tablet Take 1 tablet (4 mg total) by mouth every 8 (eight) hours as needed for muscle spasms. 08/04/17   [name], MD  warfarin (COUMADIN) 2.5 MG tablet Take 1 tablet (2.5 mg total) by mouth daily at 6 PM. Or as directed by coumadin clinic 07/27/17   Shirley Friar, PA-C    Family History Family History  Problem Relation Age of Onset  . Heart attack Neg Hx     Social History Social History  Substance Use Topics  . Smoking status: Former Smoker    Packs/day: 1.00    Types: Cigarettes  . Smokeless tobacco: Former Systems developer     Comment: said he is not a regular smoker  . Alcohol use No     Allergies   Patient has no known allergies.   Review of Systems Review of Systems  Constitutional: Positive for fatigue. Negative for chills and fever.  HENT: Negative for congestion and rhinorrhea.   Respiratory: Positive for shortness of breath. Negative for cough, hemoptysis, sputum production, chest tightness, wheezing and stridor.   Cardiovascular: Negative for chest pain, leg swelling and syncope.  Gastrointestinal: Negative for abdominal pain, constipation, diarrhea, nausea and vomiting.  Musculoskeletal: Negative for back pain, neck pain and neck stiffness.  Skin: Negative for rash and wound.  Neurological:  Negative for light-headedness, numbness and headaches.  Psychiatric/Behavioral: Negative for agitation and confusion.  All other systems reviewed and are negative.    Physical Exam Updated Vital Signs BP 100/71 (BP Location: Right Arm)   Pulse (!) 110   Temp 97.7 F (36.5 C) (Oral)   Resp 17   SpO2 96%   Physical Exam  Constitutional: He appears well-developed and well-nourished. No distress.  HENT:  Head: Normocephalic and atraumatic.  Mouth/Throat: Oropharynx is clear and moist. No oropharyngeal exudate.  Eyes: Pupils are equal, round, and reactive to light. Conjunctivae  and EOM are normal.  Neck: Normal range of motion. Neck supple.  Cardiovascular: Intact distal pulses.  An irregularly irregular rhythm present. Tachycardia present.   No murmur heard. Pulmonary/Chest: Effort normal. No stridor. No respiratory distress. He has no wheezes. He has no rhonchi. He has rales. He exhibits no tenderness.  Abdominal: Soft. There is no tenderness.  Musculoskeletal: He exhibits no edema or tenderness.  Neurological: He is alert. No sensory deficit.  Skin: Skin is warm and dry. Capillary refill takes less than 2 seconds. He is not diaphoretic. No erythema. No pallor.  Psychiatric: He has a normal mood and affect.  Nursing note and vitals reviewed.    ED Treatments / Results  Labs (all labs ordered are listed, but only abnormal results are displayed) Labs Reviewed  CBC WITH DIFFERENTIAL/PLATELET - Abnormal; Notable for the following:       Result Value   Hemoglobin 11.9 (*)    HCT 37.1 (*)    MCH 25.8 (*)    RDW 18.3 (*)    All other components within normal limits  COMPREHENSIVE METABOLIC PANEL - Abnormal; Notable for the following:    Chloride 100 (*)    Creatinine, Ser 3.74 (*)    Albumin 3.2 (*)    AST 56 (*)    ALT 14 (*)    Alkaline Phosphatase 170 (*)    Total Bilirubin 3.7 (*)    GFR calc non Af Amer 17 (*)    GFR calc Af Amer 19 (*)    All other components within  normal limits  AMMONIA - Abnormal; Notable for the following:    Ammonia 65 (*)    All other components within normal limits  I-STAT CHEM 8, ED - Abnormal; Notable for the following:    Creatinine, Ser 3.70 (*)    Calcium, Ion 0.99 (*)    All other components within normal limits  MAGNESIUM  PROTIME-INR  I-STAT TROPONIN, ED    EKG  EKG Interpretation None      ED ECG REPORT   Date: 09/18/2017  Rate: *110  Rhythm: atrial fibrillation  QRS Axis: right  Intervals: normal  ST/T Wave abnormalities: indeterminate  Conduction Disutrbances:right bundle branch block and incomplete  Narrative Interpretation:   Old EKG Reviewed: unchanged  I have personally reviewed the EKG tracing and agree with the computerized printout as noted.    Radiology Dg Chest Portable 1 View  Result Date: 09/18/2017 CLINICAL DATA:  Shortness of breath. EXAM: PORTABLE CHEST 1 VIEW COMPARISON:  09/02/2017 chest radiograph FINDINGS: Stable cardiomegaly. Small moderate bilateral pleural effusions. Reticular opacities and peripheral lines compatible with interstitial edema. Right central venous catheter tip projects over mid SVC. Aortic atherosclerosis with calcification. Bones are unremarkable. IMPRESSION: Stable cardiomegaly. Small to moderate bilateral pleural effusions. Interstitial pulmonary edema. Electronically Signed   By: Kristine Garbe M.D.   On: 09/18/2017 14:39    Procedures Procedures (including critical care time)  Medications Ordered in ED Medications - No data to display   Initial Impression / Assessment and Plan / ED Course  I have reviewed the triage vital signs and the nursing notes.  Pertinent labs & imaging results that were available during my care of the patient were reviewed by me and considered in my medical decision making (see chart for details).     Michael [name] is a 56 y.o. male with a past medical history significant for CAD, diabetes, hypertension, CHF,  asthma, atrial fibrillation on Coumadin therapy, ESRD on dialysis M/W/F/Sat  who presents with severe shortness of breath.  Patient's he was getting dialysis today and became very short of breath.  He reports that they had to stop after only 45 minutes.  He denies fevers, chills, or chest pain.  He reports feeling general malaise.  He was also reportedly hypotensive and received 500 mL of normal saline in route.  Patient's blood pressure on arrival was 440 systolic.  This improved to 347 systolic on recheck.  Patient denies other complaints aside from the shortness of breath.  He feels that he did.  He denies missing any recent dialysis sessions.  He denies any leg pain or leg swelling.  He denies any other symptoms of infection.  On my exam, patient has rales and crackles in both lungs.  Patient had no significant rhonchi.  Chest was nontender.  Abdomen nontender.  No significant lower extremity edema.  Patient is drowsy but arousable to voice.  Based on patient's history of similar episodes of fluid overload and his symptoms, as well as his crackles on exam, suspect fluid overload leading to the shortness of breath.  Patient will have lab testing and chest x-ray to evaluate.  Patient will likely need dialysis after lab and imaging evaluation.  Patient is still speaking in several word sentences.  He continues to report shortness of breath.  He is on room air but appears uncomfortable.  Patient's blood pressure has improved to the 110 range after the EMS fluids.    Nephrology will be called for recommendations.  Suspect if shortness of breath improved with dialysis, patient may be stable for discharge home.  Nephrology was called.  Given patient's history of cardiac arrest with outpatient dialysis with similar preceding symptoms, they requested patient be admitted to internal medicine teaching service and they will dialyze the patient.  Internal medicine was called and they will admit the patient.   Anticipate dialysis and admission given his likely fluid overload and shortness of breath.   Final Clinical Impressions(s) / ED Diagnoses   Final diagnoses:  Shortness of breath     Clinical Impression: 1. Shortness of breath     Disposition: Admit to Internal Medicine teaching service    Tegeler, Gwenyth Allegra, MD 09/18/17 [phone]

## 2017-09-18 NOTE — Patient Outreach (Signed)
[facility]) Care Management . 09/18/17  Michael [name] 11-Nov-1961 [id number]  RNCM unable to complete follow up call with patient today due to patient currently being in emergency department. Patient presented to the ED via EMS from his dialysis appointment. Patient became short of breath and felt ill after 45 minutes of dialysis treatment and was sent to the ED for evaluation.  RNCM to continue to follow.  [place] R. Bunnie Rehberg, RN, BSN, [facility] Management Coordinator [phone]

## 2017-09-18 NOTE — ED Notes (Signed)
Pt ambulated to bathroom independently

## 2017-09-18 NOTE — ED Notes (Signed)
Lab at bedside

## 2017-09-18 NOTE — H&P (Signed)
Date: 09/18/2017               Patient Name:  Artemio Dobie MRN: [id number]  DOB: 19-Mar-1961 Age / Sex: 56 y.o., male   PCP: [name], MD         Medical Service: Internal Medicine Teaching Service         Attending Physician: Dr. [name], MD    First Contact: Dr. Johny Chess Pager: [phone]  Second Contact: Dr. Tiburcio Pea Pager: [phone]       After Hours (After 5p/  First Contact Pager: [phone]  weekends / holidays): Second Contact Pager: [phone]   Chief Complaint: Shortness of breath   History of Present Illness: 56 year old male history of ESRD on MWF S HD, severe right-sided heart failure with pulmonary hypertension, tricuspid regurgitation, A. fib who presented to the ED from dialysis with shortness of breath.  Patient tolerated approximately 40 minutes of his dialysis session today when he became short of breath with some chest discomfort.  He notes similar symptoms at his dialysis on Wednesday which improved with dialysis settings adjustment.  He was recently admitted from 10/17-10/21 with a similar presentation.  Given his comorbidities his dialysis management and volume status are difficult to manage as he can become short of breath and hypotensive.  Currently on Midodrine.  He also experienced cardiac arrest in September.  Following discharged he states he tolerated several dialysis sessions without issue.  In the ED, afebrile, heart rate 110, BP 100/71, 96% on room air.  Labs notable for creatinine 2.74, potassium 4.8, elevated alk phos similar to prior, hemoglobin 11.9, INR 1.54.  Nephrology consulted in the ED and he was admitted for further management.  Meds:  Current Meds  Medication Sig  . acetaminophen (TYLENOL) 325 MG tablet Take 650 mg by mouth every 6 (six) hours.   Marland Kitchen acetaminophen-codeine (TYLENOL #3) 300-30 MG tablet Take 1 tablet by mouth every 12 (twelve) hours as needed for moderate pain. (Patient taking differently: Take 1 tablet by mouth 2  (two) times daily. )  . allopurinol (ZYLOPRIM) 300 MG tablet Take 0.5 tablets (150 mg total) by mouth daily.  Marland Kitchen atorvastatin (LIPITOR) 20 MG tablet Take 1 tablet (20 mg total) by mouth daily.  . calcium acetate (PHOSLO) 667 MG capsule Take 1 capsule (667 mg total) by mouth 3 (three) times daily with meals. (Patient taking differently: Take 667-2,001 mg by mouth See admin instructions. Pt takes 2,014m by mouth three times daily and 6669mtwice daily with snacks)  . colchicine 0.6 MG tablet Take 1.2 mg by mouth daily.   . diclofenac sodium (VOLTAREN) 1 % GEL Apply 4 g topically 4 (four) times daily.  . Marland Kitchenocusate sodium (COLACE) 100 MG capsule Take 1 capsule (100 mg total) by mouth 2 (two) times daily.  . folic acid (FOLVITE) 1 MG tablet TAKE 1 TABLET BY MOUTH DAILY. (Patient taking differently: TAKE 1MG BY MOUTH ONCE DAILY)  . insulin detemir (LEVEMIR) 100 UNIT/ML injection Inject 10 Units into the skin at bedtime.  . metoprolol succinate (TOPROL-XL) 25 MG 24 hr tablet Take 1 tablet (25 mg total) by mouth 2 (two) times daily. (Patient taking differently: Take 25 mg by mouth daily. )  . midodrine (PROAMATINE) 5 MG tablet Take 2 tablets (10 mg total) by mouth 3 (three) times daily with meals.  . Marland Kitchenmeprazole (PRILOSEC) 20 MG capsule Take 1 capsule (20 mg total) by mouth daily.  . OXYGEN Inhale 2 L into the lungs daily.  .Marland Kitchen  tiZANidine (ZANAFLEX) 4 MG tablet Take 1 tablet (4 mg total) by mouth every 8 (eight) hours as needed for muscle spasms.  Marland Kitchen warfarin (COUMADIN) 2.5 MG tablet Take 1 tablet (2.5 mg total) by mouth daily at 6 PM. Or as directed by coumadin clinic     Allergies: Allergies as of 09/18/2017  . (No Known Allergies)   Past Medical History:  Diagnosis Date  . Anemia of chronic disease   . Asthma    said he does not know  . Chronic kidney disease (CKD), stage IV (severe) ([place])   . Chronic systolic CHF (congestive heart failure) (HCC)    a. prior EF normal; b. Echo 10/16: Mild LVH, EF  30-35%, mitral valve bioprosthesis present without evidence of stenosis or regurgitation, severe LAE, mild RVE with mild to moderately reduced RVSF, moderate RAE  . Coronary artery disease   . Diabetes mellitus type 2 in obese ([place])   . Diabetes mellitus without complication ([place])   . Dyspnea   . GERD (gastroesophageal reflux disease)   . H/O tricuspid valve repair 2012  . History of echocardiogram    Echo at [facility] in [facility] 4/12: mod LVH, EF 60-65%, mod LAE, tissue MVR ok, mod TR, mod pulmo HTN with RVSP 48 mmHg  . Hypertension   . Obstructive sleep apnea   . Paroxysmal atrial fibrillation (HCC)    s/p Maze procedure at time of MVR in 2011  . Pulmonary HTN ([place])   . S/P mitral valve replacement with porcine valve 2012    Family History:  Family History  Problem Relation Age of Onset  . Heart attack Neg Hx      Social History:  Social History  Substance Use Topics  . Smoking status: Former Smoker    Packs/day: 1.00    Types: Cigarettes  . Smokeless tobacco: Former Systems developer     Comment: said he is not a regular smoker  . Alcohol use No     Review of Systems: A complete ROS was negative except as per HPI.   Physical Exam: Blood pressure 100/81, pulse (!) 116, temperature 97.7 F (36.5 C), temperature source Oral, resp. rate 19, SpO2 99 %. Physical Exam  Constitutional: He is oriented to person, place, and time. He appears well-developed. No distress.  HENT:  Head: Normocephalic and atraumatic.  Neck: JVD present.  Cardiovascular:  Tachycardic, precordial heave, systolic murmur   Pulmonary/Chest:  Crackles and diminished at bilateral bases   Abdominal: Soft. There is no tenderness.  Musculoskeletal:  Minimal LE edema   Neurological: He is alert and oriented to person, place, and time.  Skin: Skin is warm and dry.  LUE AV fistula, HD catheter with dressing in place    Psychiatric: He has a normal mood and affect.     EKG: personally reviewed my interpretation is  atrial fibrillation.   CXR: personally reviewed my interpretation is effusions with pulmonary edema and cardiomegaly, HD catheter.   Assessment & Plan by Problem:  Shortness of breath, R Heart Failure with pulmHTN, tricuspid regurgitation His severe right-sided heart failure has made hemodialysis management difficult with admission in October for similar symptoms and management difficulty.  At that time, midodrine  increased for hypotension.  Palliative discussions were held with family wanting full scope until dialysis not feasible.  Cardiology also consulted at the time with no further options to offer.  Volume overloaded on exam and chest x-ray.  Ongoing palliative discussions will likely need to be held again this admission --  Tele monitoring --HD per nephro as tolerated --Continue midodrine --Continue Metoprolol   ESRD on MWFSa HD Patient with 4-day hemodialysis schedule and recent difficulty as described above.  Last admission, was able to tolerate inpatient HD sessions and tolerated outpatient sessions over the past 1-2 weeks as an outpatient.  He is volume overloaded on exam, nephrology consulted in the ED and will take for HD as tolerated. --HD per Nephrology --BMP, daily weights, I/Os, renal diet --Continue Phoslo, midodrine,   Atrial Fibrillation Patient with chronic atrial fibrillation rate controlled on metoprolol 25 twice daily and on chronic warfarin therapy.  Atrial fibrillation on EKG this admission and INR subtherapeutic.  Previous evaluations note he will be unlikely to return to sinus rhythm with atrial dilation.  Periods of RVR may also contribute to his shortness of breath. --Continue metoprolol 25 mg BID  --Warfarin per pharm   Dispo: Admit patient to Inpatient with expected length of stay greater than 2 midnights.  Signed: Tawny Asal, MD 09/18/2017, 6:44 PM  Pager: [phone]

## 2017-09-18 NOTE — ED Notes (Signed)
Renal diet ordered 

## 2017-09-19 DIAGNOSIS — N186 End stage renal disease: Secondary | ICD-10-CM | POA: Diagnosis not present

## 2017-09-19 DIAGNOSIS — I071 Rheumatic tricuspid insufficiency: Secondary | ICD-10-CM | POA: Diagnosis not present

## 2017-09-19 DIAGNOSIS — D631 Anemia in chronic kidney disease: Secondary | ICD-10-CM | POA: Diagnosis not present

## 2017-09-19 DIAGNOSIS — I953 Hypotension of hemodialysis: Secondary | ICD-10-CM | POA: Diagnosis present

## 2017-09-19 DIAGNOSIS — R011 Cardiac murmur, unspecified: Secondary | ICD-10-CM

## 2017-09-19 DIAGNOSIS — K761 Chronic passive congestion of liver: Secondary | ICD-10-CM | POA: Diagnosis present

## 2017-09-19 DIAGNOSIS — I482 Chronic atrial fibrillation: Secondary | ICD-10-CM | POA: Diagnosis not present

## 2017-09-19 DIAGNOSIS — I50812 Chronic right heart failure: Secondary | ICD-10-CM

## 2017-09-19 DIAGNOSIS — E8779 Other fluid overload: Secondary | ICD-10-CM | POA: Diagnosis not present

## 2017-09-19 DIAGNOSIS — I2510 Atherosclerotic heart disease of native coronary artery without angina pectoris: Secondary | ICD-10-CM | POA: Diagnosis present

## 2017-09-19 DIAGNOSIS — I9589 Other hypotension: Secondary | ICD-10-CM | POA: Diagnosis not present

## 2017-09-19 DIAGNOSIS — I132 Hypertensive heart and chronic kidney disease with heart failure and with stage 5 chronic kidney disease, or end stage renal disease: Secondary | ICD-10-CM | POA: Diagnosis present

## 2017-09-19 DIAGNOSIS — Z952 Presence of prosthetic heart valve: Secondary | ICD-10-CM | POA: Diagnosis not present

## 2017-09-19 DIAGNOSIS — I2729 Other secondary pulmonary hypertension: Secondary | ICD-10-CM | POA: Diagnosis not present

## 2017-09-19 DIAGNOSIS — G4733 Obstructive sleep apnea (adult) (pediatric): Secondary | ICD-10-CM | POA: Diagnosis present

## 2017-09-19 DIAGNOSIS — R000 Tachycardia, unspecified: Secondary | ICD-10-CM | POA: Diagnosis not present

## 2017-09-19 DIAGNOSIS — Z992 Dependence on renal dialysis: Secondary | ICD-10-CM | POA: Diagnosis not present

## 2017-09-19 DIAGNOSIS — I5022 Chronic systolic (congestive) heart failure: Secondary | ICD-10-CM | POA: Diagnosis present

## 2017-09-19 DIAGNOSIS — D696 Thrombocytopenia, unspecified: Secondary | ICD-10-CM

## 2017-09-19 DIAGNOSIS — I50811 Acute right heart failure: Secondary | ICD-10-CM | POA: Diagnosis present

## 2017-09-19 DIAGNOSIS — Z79899 Other long term (current) drug therapy: Secondary | ICD-10-CM

## 2017-09-19 DIAGNOSIS — Z87891 Personal history of nicotine dependence: Secondary | ICD-10-CM | POA: Diagnosis not present

## 2017-09-19 DIAGNOSIS — I480 Paroxysmal atrial fibrillation: Secondary | ICD-10-CM | POA: Diagnosis present

## 2017-09-19 DIAGNOSIS — N2581 Secondary hyperparathyroidism of renal origin: Secondary | ICD-10-CM | POA: Diagnosis not present

## 2017-09-19 DIAGNOSIS — Z794 Long term (current) use of insulin: Secondary | ICD-10-CM | POA: Diagnosis not present

## 2017-09-19 DIAGNOSIS — K219 Gastro-esophageal reflux disease without esophagitis: Secondary | ICD-10-CM | POA: Diagnosis present

## 2017-09-19 DIAGNOSIS — Z7901 Long term (current) use of anticoagulants: Secondary | ICD-10-CM | POA: Diagnosis not present

## 2017-09-19 DIAGNOSIS — D638 Anemia in other chronic diseases classified elsewhere: Secondary | ICD-10-CM | POA: Diagnosis present

## 2017-09-19 DIAGNOSIS — D6959 Other secondary thrombocytopenia: Secondary | ICD-10-CM | POA: Diagnosis present

## 2017-09-19 DIAGNOSIS — Z8674 Personal history of sudden cardiac arrest: Secondary | ICD-10-CM | POA: Diagnosis not present

## 2017-09-19 DIAGNOSIS — R718 Other abnormality of red blood cells: Secondary | ICD-10-CM | POA: Diagnosis not present

## 2017-09-19 DIAGNOSIS — E1122 Type 2 diabetes mellitus with diabetic chronic kidney disease: Secondary | ICD-10-CM | POA: Diagnosis present

## 2017-09-19 DIAGNOSIS — E889 Metabolic disorder, unspecified: Secondary | ICD-10-CM | POA: Diagnosis present

## 2017-09-19 LAB — DIFFERENTIAL
BASOS PCT: 2 %
Basophils Absolute: 0.1 10*3/uL (ref 0.0–0.1)
EOS PCT: 4 %
Eosinophils Absolute: 0.2 10*3/uL (ref 0.0–0.7)
Lymphocytes Relative: 25 %
Lymphs Abs: 1.1 10*3/uL (ref 0.7–4.0)
MONOS PCT: 17 %
Monocytes Absolute: 0.7 10*3/uL (ref 0.1–1.0)
Neutro Abs: 2.3 10*3/uL (ref 1.7–7.7)
Neutrophils Relative %: 52 %

## 2017-09-19 LAB — BASIC METABOLIC PANEL
ANION GAP: 9 (ref 5–15)
BUN: 13 mg/dL (ref 6–20)
CHLORIDE: 100 mmol/L — AB (ref 101–111)
CO2: 26 mmol/L (ref 22–32)
Calcium: 8.6 mg/dL — ABNORMAL LOW (ref 8.9–10.3)
Creatinine, Ser: 3.13 mg/dL — ABNORMAL HIGH (ref 0.61–1.24)
GFR calc non Af Amer: 21 mL/min — ABNORMAL LOW (ref >60)
GFR, EST AFRICAN AMERICAN: 24 mL/min — AB (ref >60)
Glucose, Bld: 123 mg/dL — ABNORMAL HIGH (ref 65–99)
Potassium: 3.5 mmol/L (ref 3.5–5.1)
Sodium: 135 mmol/L (ref 135–145)

## 2017-09-19 LAB — CBC
HEMATOCRIT: 36.1 % — AB (ref 39.0–52.0)
Hemoglobin: 11.8 g/dL — ABNORMAL LOW (ref 13.0–17.0)
MCH: 25.8 pg — AB (ref 26.0–34.0)
MCHC: 32.7 g/dL (ref 30.0–36.0)
MCV: 79 fL (ref 78.0–100.0)
Platelets: 48 10*3/uL — ABNORMAL LOW (ref 150–400)
RBC: 4.57 MIL/uL (ref 4.22–5.81)
RDW: 18.4 % — AB (ref 11.5–15.5)
WBC: 4.2 10*3/uL (ref 4.0–10.5)

## 2017-09-19 LAB — RETICULOCYTES
RBC.: 4.56 MIL/uL (ref 4.22–5.81)
RETIC COUNT ABSOLUTE: 50.2 10*3/uL (ref 19.0–186.0)
Retic Ct Pct: 1.1 % (ref 0.4–3.1)

## 2017-09-19 LAB — PROTIME-INR
INR: 1.55
PROTHROMBIN TIME: 18.4 s — AB (ref 11.4–15.2)

## 2017-09-19 LAB — HEPATITIS B SURFACE ANTIGEN: Hepatitis B Surface Ag: NEGATIVE

## 2017-09-19 LAB — SAVE SMEAR

## 2017-09-19 LAB — GLUCOSE, CAPILLARY
Glucose-Capillary: 101 mg/dL — ABNORMAL HIGH (ref 65–99)
Glucose-Capillary: 121 mg/dL — ABNORMAL HIGH (ref 65–99)
Glucose-Capillary: 126 mg/dL — ABNORMAL HIGH (ref 65–99)
Glucose-Capillary: 219 mg/dL — ABNORMAL HIGH (ref 65–99)
Glucose-Capillary: 60 mg/dL — ABNORMAL LOW (ref 65–99)
Glucose-Capillary: 62 mg/dL — ABNORMAL LOW (ref 65–99)
Glucose-Capillary: 66 mg/dL (ref 65–99)

## 2017-09-19 LAB — HEPATIC FUNCTION PANEL
ALBUMIN: 3.1 g/dL — AB (ref 3.5–5.0)
ALT: 12 U/L — ABNORMAL LOW (ref 17–63)
AST: 37 U/L (ref 15–41)
Alkaline Phosphatase: 176 U/L — ABNORMAL HIGH (ref 38–126)
BILIRUBIN INDIRECT: 1.4 mg/dL — AB (ref 0.3–0.9)
BILIRUBIN TOTAL: 2.9 mg/dL — AB (ref 0.3–1.2)
Bilirubin, Direct: 1.5 mg/dL — ABNORMAL HIGH (ref 0.1–0.5)
TOTAL PROTEIN: 7.4 g/dL (ref 6.5–8.1)

## 2017-09-19 LAB — MRSA PCR SCREENING: MRSA BY PCR: NEGATIVE

## 2017-09-19 LAB — LACTATE DEHYDROGENASE: LDH: 271 U/L — ABNORMAL HIGH (ref 98–192)

## 2017-09-19 MED ORDER — HEPARIN SODIUM (PORCINE) 1000 UNIT/ML DIALYSIS
1000.0000 [IU] | INTRAMUSCULAR | Status: DC | PRN
  Administered 2017-09-19: 1000 [IU] via INTRAVENOUS_CENTRAL

## 2017-09-19 MED ORDER — CALCIUM ACETATE (PHOS BINDER) 667 MG PO CAPS: 667.0000 mg | ORAL_CAPSULE | Freq: Two times a day (BID) | ORAL | Status: DC | PRN

## 2017-09-19 MED ORDER — PENTAFLUOROPROP-TETRAFLUOROETH EX AERO: 1.0000 "application " | INHALATION_SPRAY | CUTANEOUS | Status: DC | PRN

## 2017-09-19 MED ORDER — SODIUM CHLORIDE 0.9 % IV SOLN: 100.0000 mL | INTRAVENOUS | Status: DC | PRN

## 2017-09-19 MED ORDER — LIDOCAINE-PRILOCAINE 2.5-2.5 % EX CREA: 1.0000 "application " | TOPICAL_CREAM | CUTANEOUS | Status: DC | PRN

## 2017-09-19 MED ORDER — LIDOCAINE HCL (PF) 1 % IJ SOLN: 5.0000 mL | INTRAMUSCULAR | Status: DC | PRN

## 2017-09-19 MED ORDER — COLCHICINE 0.6 MG PO TABS: 0.3000 mg | ORAL_TABLET | ORAL | 0 refills | Status: DC

## 2017-09-19 MED ORDER — CALCIUM ACETATE (PHOS BINDER) 667 MG PO CAPS
2001.0000 mg | ORAL_CAPSULE | Freq: Three times a day (TID) | ORAL | Status: DC
  Administered 2017-09-19 – 2017-09-20 (×3): 2001 mg via ORAL
  Filled 2017-09-19 (×3): qty 3

## 2017-09-19 MED ORDER — WARFARIN SODIUM 5 MG PO TABS
5.0000 mg | ORAL_TABLET | Freq: Once | ORAL | Status: AC
  Administered 2017-09-19: 5 mg via ORAL
  Filled 2017-09-19: qty 1

## 2017-09-19 MED ORDER — ALTEPLASE 2 MG IJ SOLR: 2.0000 mg | Freq: Once | INTRAMUSCULAR | Status: DC | PRN

## 2017-09-19 MED ORDER — DEXTROSE 50 % IV SOLN
INTRAVENOUS | Status: AC
  Administered 2017-09-19: 50 mL
  Filled 2017-09-19: qty 50

## 2017-09-19 NOTE — Progress Notes (Addendum)
   Subjective: Patient underwent dialysis last night and tolerated well with no reported shortness of breath.  No specific complaints this morning and denies shortness of breath.  Objective:  Vital signs in last 24 hours: Vitals:   09/19/17 0330 09/19/17 0344 09/19/17 0412 09/19/17 0900  BP: 115/80 129/88 132/73 106/75  Pulse: (!) 103 99 100 (!) 113  Resp: 17 18 20 18   Temp:  97.7 F (36.5 C) 98.5 F (36.9 C) 97.9 F (36.6 C)  TempSrc:  Oral Oral Oral  SpO2:  95% 98% 95%  Weight:  156 lb 1.4 oz (70.8 kg)    Height:       Physical Exam  Constitutional: He is oriented to person, place, and time. He appears well-developed and well-nourished. No distress.  Sitting in chair comfortably   Neck: JVD present.  Cardiovascular:  Slightly tachycardic, precordial heave   Pulmonary/Chest: Effort normal.  Diminished at bases (R>L) with crackles   Abdominal: Soft. There is no tenderness.  Neurological: He is alert and oriented to person, place, and time.     Assessment/Plan:  Shortness of breath, R Heart Failure with pulmHTN, tricuspid regurgitation Presented with shortness of breath during dialysis and volume overload.  Patient has severe right-sided heart failure with pulmonary hypertension and tricuspid regurgitation which has made recent outpatient dialysis sessions difficult to manage as patient experiences shortness of breath, hypotension, recent admission in October for similar issues.  Patient tolerated inpatient dialysis with no issue.  Prior palliative discussions indicated the patient and family would attempt dialysis until no longer feasible.  Currently asymptomatic. --Tele --HD per nephro --Continue midodrine    ESRD on MWFsa HD Currently on 4 day/week schedule of HD due to difficulty described above.  He is also on midodrine for associated hypotension.  Tolerated HD yesterday and will receive HD again today.  Case discussed with nephrology and he may be able to be discharged  if he tolerates another dialysis session. --HD per Nephrology --BMP. Daily weights, renal diet --Nephro to re-establish EDW  --Continue phoslo, midodrine   Thrombocytopenia, Cirrhosis Patient with thrombocytopenia on admission, down to 48.  This was present on previous admission and increased to greater than 100 prior to discharge at that time.  On further review, it appears evidence of cirrhosis has been noted on imaging studies which explains thrombocytopenia.  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 --CBC, continue to monitor   Atrial Fibrillation  Patient with a history of chronic atrial fibrillation rate controlled on metoprolol 25 twice daily and on chronic warfarin therapy.  INR subtherapeutic on admission.  Patient has received increased dose of warfarin as inpatient. --Warfarin per pharm --Continue metoprolol    Dispo: Anticipated discharge in approximately 0-1 day(s).   Tawny Asal, MD 09/19/2017, 11:41 AM Pager: [phone]

## 2017-09-19 NOTE — Progress Notes (Signed)
HD tx initiated via HD cath w/o problem, pull equally sluggish, push/flush equally well, lines reversed, VSS, will cont to monitor while on HD tx

## 2017-09-19 NOTE — Progress Notes (Signed)
ANTICOAGULATION CONSULT NOTE  Pharmacy Consult for Coumadin Indication: atrial fibrillation  No Known Allergies  Patient Measurements: Height: 5\' 5"  (165.1 cm) Weight: 156 lb 1.4 oz (70.8 kg) IBW/kg (Calculated) : 61.5  Vital Signs: Temp: 97.9 F (36.6 C) (11/03 0900) Temp Source: Oral (11/03 0900) BP: 106/75 (11/03 0900) Pulse Rate: 113 (11/03 0900)  Labs:  Recent Labs  09/18/17 1538 09/18/17 1547 09/19/17 0522 09/19/17 0856  HGB 11.9* 14.6 11.8*  --   HCT 37.1* 43.0 36.1*  --   PLT 50*  --  48*  --   LABPROT 18.3*  --  18.4*  --   INR 1.54  --  1.55  --   CREATININE 3.74* 3.70*  --  3.13*    Estimated Creatinine Clearance: 22.7 mL/min (A) (by C-G formula based on SCr of 3.13 mg/dL (H)).   Assessment: 18 YOM with AFib to continue on warfarin.  Home dose is 2.5mg  daily- last dose taken PTA on 11/1. Noted patient's INR was low in clinic during the week, was also low on admission. This morning, INR 1.55.  Hgb 11.8, plts 48- chronic thrombocytopenia.   Goal of Therapy:  INR 2-3     Plan:   Warfarin 5mg  po x1 tonight  Daily INR and CBC Follow for s/s bleeding  Anabela Crayton D. Kenshawn Maciolek, PharmD, BCPS Clinical Pharmacist Pager: [phone] Clinical Phone for 09/19/2017 until 3:30pm: [phone] If after 3:30pm, please call main pharmacy at [phone] 09/19/2017 10:42 AM

## 2017-09-19 NOTE — Progress Notes (Signed)
Hypoglycemic Event  CBG: Results for Michael [name], Michael [name] (MRN [id number]) as of 09/19/2017 04:36  Ref. Range 09/19/2017 03:48  Glucose-Capillary Latest Ref Range: 65 - 99 mg/dL 66    Treatment: 15 GM carbohydrate snack  Symptoms: None  Follow-up CBG: Time: CBG Result:  Results for Michael [name], Michael [name] (MRN [id number]) as of 09/19/2017 04:37  Ref. Range 09/19/2017 04:20  Glucose-Capillary Latest Ref Range: 65 - 99 mg/dL 60 (L)   Possible Reasons for Event: Inadequate meal intake  Comments/MD notified:    Viviano Simas

## 2017-09-19 NOTE — Progress Notes (Signed)
[facility] Progress Note   Subjective: Seen in room. Didn't sleep well, legs restless. Denies CP, SOB. HD yesterday with net UF 2L  Objective Vitals:   09/19/17 0330 09/19/17 0344 09/19/17 0412 09/19/17 0900  BP: 115/80 129/88 132/73 106/75  Pulse: (!) 103 99 100 (!) 113  Resp: 17 18 20 18   Temp:  97.7 F (36.5 C) 98.5 F (36.9 C) 97.9 F (36.6 C)  TempSrc:  Oral Oral Oral  SpO2:  95% 98% 95%  Weight:  70.8 kg (156 lb 1.4 oz)    Height:       Physical Exam General: WNWD NAD Heart: irregular rythmn 2/6 SEM Lungs: diminished bilat, faint crackles at bases  Abdomen: soft NT Extremities: no LE edema Dialysis Access: R IJ TDC, LUE AVF +bruit, maturing   Additional Objective Labs: Basic Metabolic Panel:  Recent Labs Lab 09/18/17 1538 09/18/17 1547 09/19/17 0856  NA 136 138 135  K 4.8 4.3 3.5  CL 100* 103 100*  CO2 25  --  26  GLUCOSE 66 65 123*  BUN 18 20 13   CREATININE 3.74* 3.70* 3.13*  CALCIUM 8.9  --  8.6*   Liver Function Tests:  Recent Labs Lab 09/18/17 1538 09/19/17 0856  AST 56* 37  ALT 14* 12*  ALKPHOS 170* 176*  BILITOT 3.7* 2.9*  PROT 7.8 7.4  ALBUMIN 3.2* 3.1*   No results for input(s): LIPASE, AMYLASE in the last 168 hours. CBC:  Recent Labs Lab 09/18/17 1538 09/18/17 1547 09/19/17 0522  WBC 5.8  --  4.2  NEUTROABS 2.7  --   --   HGB 11.9* 14.6 11.8*  HCT 37.1* 43.0 36.1*  MCV 80.3  --  79.0  PLT 50*  --  48*   Blood Culture    Component Value Date/Time   SDES BLOOD RIGHT HAND 08/19/2017 2050   SPECREQUEST  08/19/2017 2050    BOTTLES DRAWN AEROBIC ONLY Blood Culture adequate volume   CULT NO GROWTH 5 DAYS 08/19/2017 2050   REPTSTATUS 08/24/2017 FINAL 08/19/2017 2050    Cardiac Enzymes: No results for input(s): CKTOTAL, CKMB, CKMBINDEX, TROPONINI in the last 168 hours. CBG:  Recent Labs Lab 09/19/17 0348 09/19/17 0420 09/19/17 0454 09/19/17 0750  GLUCAP 66 60* 219* 101*   Iron Studies: No results  for input(s): IRON, TIBC, TRANSFERRIN, FERRITIN in the last 72 hours. Lab Results  Component Value Date   INR 1.55 09/19/2017   INR 1.54 09/18/2017   INR 1.6 09/15/2017   Medications:  . allopurinol  150 mg Oral Daily  . atorvastatin  20 mg Oral Daily  . calcium acetate  667 mg Oral TID WC  . colchicine  1.2 mg Oral Daily  . docusate sodium  100 mg Oral BID  . folic acid  1 mg Oral Daily  . metoprolol succinate  25 mg Oral BID  . midodrine  10 mg Oral TID WC  . pantoprazole  40 mg Oral Daily  . Warfarin - Pharmacist Dosing Inpatient   Does not apply q1800    Dialysis Orders:  [facility] 4h   3K/2.25  73kg   Hep 4200   R IJ TDC/ maturing LUA AVF (1st stage BVT 07/13/17, Dr Donzetta Matters) - Mircera 142mcg IV q 2 weeks  Assessment/Plan: 1.  Dyspnea/pulmonary edema: Plan serial HD for volume correction, For HD again today. Recurrent R pleural effusion by CXR.   2.  ESRD:  HD today, per usual MWFS schedule. 4K bath  3.  Hypotension/volume: Chronic hypotension/CHF. Below dry weight - Needs EDW reduced. 4.  Anemia: Hgb 14.6, no ESA needed. 5.  Metabolic bone disease: Ca ok, continue home binder. 6.  CAD (Hx cardiac arrest) 7. Type 2 DM 8. HFrEF (50-55% per 07/2017 echo) 9. Afib  -warfarin/metoprol  10. Thrombocytopenia - hold heparin   Lynnda Child PA-C [facility] Pager [phone] 09/19/2017,10:32 AM  LOS: 0 days   Pt seen, examined and agree w A/P as above.  Kelly Splinter MD [employer] pager [phone]   09/19/2017, 12:10 PM

## 2017-09-19 NOTE — Progress Notes (Signed)
HD tx completed _0  w/o problem, UF goal met, blood rinsed back, VSS, report called to Margie Ege, RN

## 2017-09-19 NOTE — Discharge Summary (Signed)
Name: Michael [name] MRN: [id number] DOB: [birth year] 56 y.o. PCP: [name], MD  Date of Admission: 09/18/2017  1:40 PM Date of Discharge: 09/20/2017 Attending Physician: [name], MD  Discharge Diagnosis: Active Problems:   Dyspnea Thrombocytopenia  Cirrhosis  Right heart failure  Discharge Medications: Allergies as of 09/20/2017   No Known Allergies     Medication List    STOP taking these medications   ACCU-CHEK AVIVA PLUS w/Device Kit   ACCU-CHEK SOFTCLIX LANCETS lancets   glucose blood test strip Commonly known as:  ACCU-CHEK AVIVA PLUS   insulin detemir 100 UNIT/ML injection Commonly known as:  LEVEMIR     TAKE these medications   acetaminophen 325 MG tablet Commonly known as:  TYLENOL Take 650 mg by mouth every 6 (six) hours.   acetaminophen-codeine 300-30 MG tablet Commonly known as:  TYLENOL #3 Take 1 tablet by mouth every 12 (twelve) hours as needed for moderate pain. What changed:  when to take this   allopurinol 300 MG tablet Commonly known as:  ZYLOPRIM Take 0.5 tablets (150 mg total) by mouth daily.   atorvastatin 20 MG tablet Commonly known as:  LIPITOR Take 1 tablet (20 mg total) by mouth daily.   calcium acetate 667 MG capsule Commonly known as:  PHOSLO Take 1 capsule (667 mg total) by mouth 3 (three) times daily with meals. What changed:    how much to take  when to take this  additional instructions   colchicine 0.6 MG tablet Take 0.5 tablets (0.3 mg total) by mouth 2 (two) times a week. Start taking on:  09/21/2017 What changed:    how much to take  when to take this   diclofenac sodium 1 % Gel Commonly known as:  VOLTAREN Apply 4 g topically 4 (four) times daily.   docusate sodium 100 MG capsule Commonly known as:  COLACE Take 1 capsule (100 mg total) by mouth 2 (two) times daily.   folic acid 1 MG tablet Commonly known as:  FOLVITE TAKE 1 TABLET BY MOUTH DAILY. What changed:    how much to  take  how to take this  when to take this   metoprolol succinate 25 MG 24 hr tablet Commonly known as:  TOPROL-XL Take 1 tablet (25 mg total) by mouth 2 (two) times daily. What changed:  when to take this   midodrine 5 MG tablet Commonly known as:  PROAMATINE Take 2 tablets (10 mg total) by mouth 3 (three) times daily with meals.   omeprazole 20 MG capsule Commonly known as:  PRILOSEC Take 1 capsule (20 mg total) by mouth daily.   OXYGEN Inhale 2 L into the lungs daily.   tiZANidine 4 MG tablet Commonly known as:  ZANAFLEX Take 1 tablet (4 mg total) by mouth every 8 (eight) hours as needed for muscle spasms.   warfarin 2.5 MG tablet Commonly known as:  COUMADIN Take 1 tablet (2.5 mg total) by mouth daily at 6 PM. Or as directed by coumadin clinic       Disposition and follow-up:   Mr.[name] was discharged from [facility] in Stable condition.  At the hospital follow up visit please address:  1.  -Assess if tolerating HD sessions as outpatient -Close INR monitoring and dose adjustment as needed  -CBC to eval for improvement in thrombocytopenia -Ensure removal of insulin from medication lists   2.  Labs / imaging needed at time of follow-up: CBC, INR  3.  Pending  labs/ test needing follow-up: none  Follow-up Appointments: Follow-up Information    [name], MD. Go in 1 week(s).   Specialty:  Family Medicine Why:  please call to make appt as clinic is closed today Contact information: [place] [id number] [place] Hospital Course by problem list:   SOB, R Sided Heart Failure Pulmonary HTN, Tricuspid Regurgitation Patient presented from his outpatient dialysis center due to shortness of breath during his dialysis.  On presentation, he was grossly fluid overloaded but improved symptomatically.  He tolerated his dialysis sessions of the inpatient without symptoms.  With the severity of his  right-sided heart failure dialysis and management of his volume status is becoming increasingly difficult.  Palliative discussions held at last admission indicated the patient would like to continue with dialysis until no longer feasible.  If he continues to have recurrent difficulty as an outpatient these palliative focus discussions should be revisited.  As he was tolerating dialysis sessions and cardiology was unable to offer further interventions when consulted last admission, no changes to his medication regimen were made.  ESRD on MWFSa HD Patient is on a 4-day dialysis schedule in an attempt to improve success with his dialysis.  Nephrology was consulted and provided HD as inpatient which she tolerated well without recurrence of the symptoms leading to his admission.  See assessment above for further discussion. Colchicine dose adjusted for ESRD per pharm.   Thrombocytopenia, Cirrhosis Patient found to have thrombocytopenia to 48 on admission.  On further review this is explained with a history of cirrhosis, likely due to chronic congestion related to his right-sided heart failure.  No schistocytes or other concerning process on review of peripheral blood smear which we personally reviewed- however it has not been revewed by pathology as they are not available in weekend.  Platelets had slightly improved to 55 on discharge.   Atrial Fibrillation Patient has a history of chronic atrial fibrillation which is rate controlled with metoprolol 25 twice daily and is on chronic warfarin therapy.  On admission INR was subtherapeutic at 1.54.  Pharmacy consulted and provided increased warfarin dosing, he is discharged on his prior 2.5 mg dose.  He will continue to need monitoring and dose adjustment as an outpatient.  Type 2 DM Patient has a history of type 2 diabetes. A1c of 5.2  Insulin was on his home medication list during reconciliation.  However, his recent outpatient visit with his PCP notes to  discontinue insulin and he has not required insulin during this admission and prior.  Insulin removed from med list.   Discharge Vitals:   BP 104/75 (BP Location: Right Arm)   Pulse (!) 105   Temp (!) 97.4 F (36.3 C) (Oral)   Resp 18   Ht _0  (1.651 m)   Wt 154 lb 12.2 oz (70.2 kg)   SpO2 99%   BMI 25.75 kg/m   Pertinent Labs, Studies, and Procedures:  BMP Latest Ref Rng & Units 09/20/2017 09/19/2017 09/18/2017  Glucose 65 - 99 mg/dL 117(H) 123(H) 65  BUN 6 - 20 mg/dL _1 Creatinine 0.61 - 1.24 mg/dL 2.49(H) 3.13(H) 3.70(H)  BUN/Creat Ratio 9 - 20 - - -  Sodium 135 - 145 mmol/L 136 135 138  Potassium 3.5 - 5.1 mmol/L 3.7 3.5 4.3  Chloride 101 - 111 mmol/L 101 100(L) 103  CO2 22 - 32 mmol/L 26 26 -  Calcium 8.9 -  10.3 mg/dL 8.7(L) 8.6(L) -   CBC Latest Ref Rng & Units 09/20/2017 09/20/2017 09/19/2017  WBC 4.0 - 10.5 K/uL 4.3 4.0 4.2  Hemoglobin 13.0 - 17.0 g/dL 11.3(L) 11.2(L) 11.8(L)  Hematocrit 39.0 - 52.0 % 34.1(L) 34.0(L) 36.1(L)  Platelets 150 - 400 K/uL 55(L) 51(L) 48(L)     Discharge Instructions: Discharge Instructions    Diet - low sodium heart healthy   Complete by:  As directed    Diet - low sodium heart healthy   Complete by:  As directed    Increase activity slowly   Complete by:  As directed    Increase activity slowly   Complete by:  As directed       Signed: Burgess Estelle, MD 09/20/2017, 10:39 AM   Pager: [phone]

## 2017-09-19 NOTE — Progress Notes (Signed)
New Admission Note:  Arrival Method: Stretcher  Mental Orientation: Alert and oriented x 4 Telemetry: Box 5 Afib Assessment: Completed Skin: Warm and dry.  IV: NSL Pain: Denies  Tubes: N/A Safety Measures: Safety Fall Prevention Plan initiated.  Admission: Completed 2 [place] Orientation: Patient has been orientated to the room, unit and the staff. Family: None   Orders have been reviewed and implemented. Will continue to monitor the patient. Call light has been placed within reach and bed alarm has been activated.   Sima Matas BSN, RN  Phone Number: [phone]

## 2017-09-19 NOTE — Progress Notes (Signed)
  Date: 09/19/2017  Patient name: Michael [name]  Medical record number: [id number]  Date of birth: 06-28-1961   I have seen and evaluated [name] and discussed their care with the Residency Team.  In brief, patient is a 56 year old male with a past medical history of end-stage renal disease on hemodialysis, severe right-sided heart failure with pulmonary hypertension, tricuspid regurgitation, atrial fibrillation who presented to the ED from hemodialysis with worsening shortness of breath x1 day.  Patient was recently admitted for similar symptoms in October (09/02/2017-09/06/2017).  He has been having difficulty with hemodialysis as an outpatient as he becomes hypotensive on dialysis and is unable to complete sessions and is now doing hemodialysis 4 days a week to help compensate for this.  In the ED he was noted to have pulmonary edema on his chest x-ray and was admitted for fluid overload requiring hemodialysis.  Today patient feels much better and denies any shortness of breath.  He is scheduled for hemodialysis again today.  PMHx, Fam Hx, and/or Soc Hx : As per resident admit note  Vitals:   09/19/17 0412 09/19/17 0900  BP: 132/73 106/75  Pulse: 100 (!) 113  Resp: 20 18  Temp: 98.5 F (36.9 C) 97.9 F (36.6 C)  SpO2: 98% 95%   General: Awake alert and oriented x3, NAD CVS: Mild tachycardia, systolic murmur noted Lungs: CTA bilaterally Abdomen: Soft, nontender, nondistended, normoactive bowel sounds Extremities: No edema noted  Assessment and Plan: I have seen and evaluated the patient as outlined above. I agree with the formulated Assessment and Plan as detailed in the residents' note, with the following changes:   1.  Shortness of breath secondary to fluid overload: -Patient has history of severe right-sided heart failure and has had difficulty with hemodialysis as an outpatient secondary to recurrent hypotension during dialysis.  Patient was started on midodrine  for this but still has issues with hemodialysis.  On his last admission palliative discussions were held with the patient and his family regarding the difficulty in continuing with hemodialysis given his comorbidities but he opted to continue for as long as feasible. -Patient is much improved today after receiving hemodialysis yesterday and is scheduled for repeat hemodialysis today -Nephrology follow-up and recommendations appreciated -If patient continues to have issues with hemodialysis as an outpatient will need to revisit palliative care discussions and possibly stopping hemodialysis -Blood pressure appears stable today.  We will monitor closely.  Continue with midodrine -We will continue with anti-coagulation for his A. fib per pharmacy.  INR was noted to be subtherapeutic on this admission.  Metoprolol held this morning as he is scheduled for hemodialysis today.  [name], MD 11/3/201810:56 AM

## 2017-09-20 ENCOUNTER — Other Ambulatory Visit: Payer: Self-pay

## 2017-09-20 LAB — CBC
HEMATOCRIT: 34 % — AB (ref 39.0–52.0)
HEMATOCRIT: 34.1 % — AB (ref 39.0–52.0)
Hemoglobin: 11.2 g/dL — ABNORMAL LOW (ref 13.0–17.0)
Hemoglobin: 11.3 g/dL — ABNORMAL LOW (ref 13.0–17.0)
MCH: 26.1 pg (ref 26.0–34.0)
MCH: 26.3 pg (ref 26.0–34.0)
MCHC: 32.9 g/dL (ref 30.0–36.0)
MCHC: 33.1 g/dL (ref 30.0–36.0)
MCV: 79.3 fL (ref 78.0–100.0)
MCV: 79.5 fL (ref 78.0–100.0)
PLATELETS: 55 10*3/uL — AB (ref 150–400)
Platelets: 51 10*3/uL — ABNORMAL LOW (ref 150–400)
RBC: 4.29 MIL/uL (ref 4.22–5.81)
RBC: 4.29 MIL/uL (ref 4.22–5.81)
RDW: 18.1 % — AB (ref 11.5–15.5)
RDW: 18.1 % — AB (ref 11.5–15.5)
WBC: 4 10*3/uL (ref 4.0–10.5)
WBC: 4.3 10*3/uL (ref 4.0–10.5)

## 2017-09-20 LAB — RENAL FUNCTION PANEL
Albumin: 2.9 g/dL — ABNORMAL LOW (ref 3.5–5.0)
Anion gap: 9 (ref 5–15)
BUN: 7 mg/dL (ref 6–20)
CHLORIDE: 101 mmol/L (ref 101–111)
CO2: 26 mmol/L (ref 22–32)
CREATININE: 2.49 mg/dL — AB (ref 0.61–1.24)
Calcium: 8.7 mg/dL — ABNORMAL LOW (ref 8.9–10.3)
GFR calc Af Amer: 31 mL/min — ABNORMAL LOW (ref >60)
GFR, EST NON AFRICAN AMERICAN: 27 mL/min — AB (ref >60)
GLUCOSE: 117 mg/dL — AB (ref 65–99)
POTASSIUM: 3.7 mmol/L (ref 3.5–5.1)
Phosphorus: 1.9 mg/dL — ABNORMAL LOW (ref 2.5–4.6)
Sodium: 136 mmol/L (ref 135–145)

## 2017-09-20 LAB — PROTIME-INR
INR: 1.96
Prothrombin Time: 22.1 seconds — ABNORMAL HIGH (ref 11.4–15.2)

## 2017-09-20 LAB — GLUCOSE, CAPILLARY
Glucose-Capillary: 92 mg/dL (ref 65–99)
Glucose-Capillary: 109 mg/dL — ABNORMAL HIGH (ref 65–99)

## 2017-09-20 MED ORDER — LIDOCAINE HCL (PF) 1 % IJ SOLN: 5.0000 mL | INTRAMUSCULAR | Status: DC | PRN

## 2017-09-20 MED ORDER — LIDOCAINE-PRILOCAINE 2.5-2.5 % EX CREA: 1.0000 "application " | TOPICAL_CREAM | CUTANEOUS | Status: DC | PRN

## 2017-09-20 MED ORDER — PENTAFLUOROPROP-TETRAFLUOROETH EX AERO: 1.0000 "application " | INHALATION_SPRAY | CUTANEOUS | Status: DC | PRN

## 2017-09-20 MED ORDER — HEPARIN SODIUM (PORCINE) 1000 UNIT/ML DIALYSIS: 20.0000 [IU]/kg | INTRAMUSCULAR | Status: DC | PRN

## 2017-09-20 MED ORDER — ALTEPLASE 2 MG IJ SOLR: 2.0000 mg | Freq: Once | INTRAMUSCULAR | Status: DC | PRN

## 2017-09-20 MED ORDER — HEPARIN SODIUM (PORCINE) 1000 UNIT/ML DIALYSIS
1000.0000 [IU] | INTRAMUSCULAR | Status: DC | PRN
Start: 2017-09-20 — End: 2017-09-20

## 2017-09-20 MED ORDER — SODIUM CHLORIDE 0.9 % IV SOLN: 100.0000 mL | INTRAVENOUS | Status: DC | PRN

## 2017-09-20 NOTE — Progress Notes (Signed)
   Subjective:   No acute events overnight. Pt feeling well. Had dialysis late last night and pulled out another 2.5 L fluid. Per renal he is ok for discharge home today.    Objective:  Vital signs in last 24 hours: Vitals:   09/19/17 2310 09/19/17 2317 09/19/17 2355 09/20/17 0500  BP: 98/62 112/87 115/90 108/71  Pulse: 81 99 95 78  Resp: 19 (!) 28 (!) 21 20  Temp:  (!) 97.4 F (36.3 C) 98.3 F (36.8 C) 98.6 F (37 C)  TempSrc:  Oral Oral Oral  SpO2: 100% 99% 99% 99%  Weight:  154 lb 12.2 oz (70.2 kg) 154 lb 12.2 oz (70.2 kg)   Height:        General: Vital signs reviewed. Patient in no acute distress Cardiovascular: regular rate, rhythm, no murmur appreciated  Pulmonary/Chest: Clear to auscultation bilaterally, no wheezes, rales, or rhonchi. Abdominal: Soft, non-tender, non-distended, BS + Extremities: No lower extremity edema bilateral Skin: Warm, dry and intact. No rashes or erythema.     Assessment/Plan:  Shortness of breath, R Heart Failure with pulmHTN, tricuspid regurgitation:  His symptoms are improved after undergoing 2 sessions of dialysis on Friday and yesterday night. Today he is feeling well, and per renal is stable to discharge home.  --Continue midodrine    ESRD on MWFsa HD Currently on 4 day/week schedule of HD due to difficulty described above.  He is also on midodrine for associated hypotension.  Tolerated HD sessions and per renal ok for discharge. His EDW was challenged and is now lower.  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  This was present on previous admission and increased to greater than 100 prior to discharge at that time.  On further review, it appears evidence of cirrhosis has been noted on imaging studies which explains thrombocytopenia.  His cirrhosis is likely a result  of chronic congestive hepatitis due to right-sided heart failure, though no prior workup found in initial chart review. LDH mildle elevated. Smear didn't show any schistocytes but pathology review on Monday. Prior Hep C, HIV and HeB are negative. Holding heparin. Prior Abd US showed stigmata of cirrhosis. His platelets are stable today.  -follow up outpatient for this.  Atrial Fibrillation  Patient with a history of chronic atrial fibrillation rate controlled on metoprolol 25 twice daily and on chronic warfarin therapy.  INR subtherapeutic on admission.  Patient has received increased dose of warfarin as inpatient. INR improved today to 1.96.  --Warfarin per pharm --Continue metoprolol    Dispo: Anticipated discharge in approximately today  Burgess Estelle, MD 09/20/2017, 9:00 AM

## 2017-09-20 NOTE — Discharge Instructions (Signed)
IT was a pleasure taking care of you Please resume your dialysis starting tomorrow Please follow up with your primary doctor- The "platelets" in your blood were low- and you will need repeat blood work to determine where they stand in 1 week. We have ruled out some of the causes but this will need to be worked up further. Please mention this to your doctor. ALso, it is noted that you likely have cirrhosis due to your heart failure- please follow up with your doctor as well about this.

## 2017-09-20 NOTE — Progress Notes (Signed)
[facility] Progress Note   Subjective: seen in room, sitting up, no distress, says no further SOB, breathing much better since HD x 2.    Objective Vitals:   09/19/17 2310 09/19/17 2317 09/19/17 2355 09/20/17 0500  BP: 98/62 112/87 115/90 108/71  Pulse: 81 99 95 78  Resp: 19 (!) 28 (!) 21 20  Temp:  (!) 97.4 F (36.3 C) 98.3 F (36.8 C) 98.6 F (37 C)  TempSrc:  Oral Oral Oral  SpO2: 100% 99% 99% 99%  Weight:  70.2 kg (154 lb 12.2 oz) 70.2 kg (154 lb 12.2 oz)   Height:       Physical Exam General: WNWD NAD Heart: irregular rythmn 2/6 SEM Lungs: clear bilat, no rales or wheezing Abdomen: soft NT Extremities: no LE edema Dialysis Access: R IJ TDC, LUE AVF +bruit, maturing   Additional Objective Labs: Basic Metabolic Panel: Recent Labs  Lab 09/18/17 1538 09/18/17 1547 09/19/17 0856  NA 136 138 135  K 4.8 4.3 3.5  CL 100* 103 100*  CO2 25  --  26  GLUCOSE 66 65 123*  BUN 18 20 13   CREATININE 3.74* 3.70* 3.13*  CALCIUM 8.9  --  8.6*   Liver Function Tests: Recent Labs  Lab 09/18/17 1538 09/19/17 0856  AST 56* 37  ALT 14* 12*  ALKPHOS 170* 176*  BILITOT 3.7* 2.9*  PROT 7.8 7.4  ALBUMIN 3.2* 3.1*   No results for input(s): LIPASE, AMYLASE in the last 168 hours. CBC: Recent Labs  Lab 09/18/17 1538 09/18/17 1547 09/19/17 0522 09/19/17 1053 09/20/17 0321  WBC 5.8  --  4.2  --  4.0  NEUTROABS 2.7  --   --  2.3  --   HGB 11.9* 14.6 11.8*  --  11.2*  HCT 37.1* 43.0 36.1*  --  34.0*  MCV 80.3  --  79.0  --  79.3  PLT 50*  --  48*  --  51*   Blood Culture    Component Value Date/Time   SDES BLOOD RIGHT HAND 08/19/2017 2050   SPECREQUEST  08/19/2017 2050    BOTTLES DRAWN AEROBIC ONLY Blood Culture adequate volume   CULT NO GROWTH 5 DAYS 08/19/2017 2050   REPTSTATUS 08/24/2017 FINAL 08/19/2017 2050    Cardiac Enzymes: No results for input(s): CKTOTAL, CKMB, CKMBINDEX, TROPONINI in the last 168 hours. CBG: Recent Labs  Lab  09/19/17 0454 09/19/17 0750 09/19/17 1214 09/19/17 1701 09/19/17 2349  GLUCAP 219* 101* 126* 121* 62*   Iron Studies: No results for input(s): IRON, TIBC, TRANSFERRIN, FERRITIN in the last 72 hours. Lab Results  Component Value Date   INR 1.96 09/20/2017   INR 1.55 09/19/2017   INR 1.54 09/18/2017   Medications: . sodium chloride    . sodium chloride     . allopurinol  150 mg Oral Daily  . atorvastatin  20 mg Oral Daily  . calcium acetate  2,001 mg Oral TID WC  . colchicine  1.2 mg Oral Daily  . docusate sodium  100 mg Oral BID  . folic acid  1 mg Oral Daily  . metoprolol succinate  25 mg Oral BID  . midodrine  10 mg Oral TID WC  . pantoprazole  40 mg Oral Daily  . Warfarin - Pharmacist Dosing Inpatient   Does not apply q1800    Dialysis Orders:  [facility] 4h   3K/2.25  73kg   Hep 4200   R IJ TDC/ maturing LUA AVF (1st  stage BVT 07/13/17, Dr Donzetta Matters) - Mircera 147mcg IV q 2 weeks  Assessment: 1.  Dyspnea/pulmonary edema: resolved clinically after HD x 2.  On room air, exam w/ no vol excess. Pt has lost body wt and will need lower edw at dc.   2. ESRD:  HD [facility].   3. Hypotension/volume: Chronic hypotension/CHF, on midodrine 4. Anemia: Hgb 14.6, no ESA needed. 5. Metabolic bone disease: Ca ok, continue home binder. 6. CAD (Hx cardiac arrest) 7. Type 2 DM 8. HFrEF (50-55% per 07/2017 echo) 9. Afib  -warfarin/metoprol  10. Thrombocytopenia - hold heparin    Plan - ok for dc today from renal standpoint.  Resume HD Monday at OP unit.

## 2017-09-20 NOTE — Progress Notes (Signed)
ANTICOAGULATION CONSULT NOTE  Pharmacy Consult for Coumadin Indication: atrial fibrillation  No Known Allergies  Patient Measurements: Height: 5\' 5"  (165.1 cm) Weight: 154 lb 12.2 oz (70.2 kg) IBW/kg (Calculated) : 61.5  Vital Signs: Temp: 97.4 F (36.3 C) (11/04 0935) Temp Source: Oral (11/04 0935) BP: 104/75 (11/04 0935) Pulse Rate: 105 (11/04 0935)  Labs: Recent Labs    09/18/17 1538 09/18/17 1547 09/19/17 0522 09/19/17 0856 09/20/17 0321 09/20/17 0514  HGB 11.9* 14.6 11.8*  --  11.2* 11.3*  HCT 37.1* 43.0 36.1*  --  34.0* 34.1*  PLT 50*  --  48*  --  51* 55*  LABPROT 18.3*  --  18.4*  --  22.1*  --   INR 1.54  --  1.55  --  1.96  --   CREATININE 3.74* 3.70*  --  3.13*  --  2.49*    Estimated Creatinine Clearance: 28.5 mL/min (A) (by C-G formula based on SCr of 2.49 mg/dL (H)).   Assessment: 75 YOM with AFib to continue on warfarin.  Home dose is 2.5mg  daily- last dose taken PTA on 11/1. Noted patient's INR was low in clinic during the week, was also low on admission. This morning, INR 1.96.  Hgb 11.3, plts 55- chronic thrombocytopenia.  Noted patient discharging today.  Goal of Therapy:  INR 2-3     Plan:   Resume home dose of warfarin 2.5mg  po daily Recommend INR check on Wednesday 11/7   Patra Gherardi D. Jill Stopka, PharmD, BCPS Clinical Pharmacist Pager: [phone] Clinical Phone for 09/20/2017 until 3:30pm: [phone] If after 3:30pm, please call main pharmacy at [phone] 09/20/2017 10:36 AM

## 2017-09-21 DIAGNOSIS — I482 Chronic atrial fibrillation: Secondary | ICD-10-CM | POA: Diagnosis not present

## 2017-09-21 DIAGNOSIS — D631 Anemia in chronic kidney disease: Secondary | ICD-10-CM | POA: Diagnosis not present

## 2017-09-21 DIAGNOSIS — E876 Hypokalemia: Secondary | ICD-10-CM | POA: Diagnosis not present

## 2017-09-21 DIAGNOSIS — I509 Heart failure, unspecified: Secondary | ICD-10-CM | POA: Diagnosis not present

## 2017-09-21 DIAGNOSIS — N186 End stage renal disease: Secondary | ICD-10-CM | POA: Diagnosis not present

## 2017-09-21 DIAGNOSIS — N2581 Secondary hyperparathyroidism of renal origin: Secondary | ICD-10-CM | POA: Diagnosis not present

## 2017-09-21 DIAGNOSIS — D696 Thrombocytopenia, unspecified: Secondary | ICD-10-CM | POA: Diagnosis not present

## 2017-09-21 LAB — PATHOLOGIST SMEAR REVIEW

## 2017-09-21 LAB — GLUCOSE, CAPILLARY: GLUCOSE-CAPILLARY: 147 mg/dL — AB (ref 65–99)

## 2017-09-23 DIAGNOSIS — E876 Hypokalemia: Secondary | ICD-10-CM | POA: Diagnosis not present

## 2017-09-23 DIAGNOSIS — N186 End stage renal disease: Secondary | ICD-10-CM | POA: Diagnosis not present

## 2017-09-23 DIAGNOSIS — N2581 Secondary hyperparathyroidism of renal origin: Secondary | ICD-10-CM | POA: Diagnosis not present

## 2017-09-23 DIAGNOSIS — I509 Heart failure, unspecified: Secondary | ICD-10-CM | POA: Diagnosis not present

## 2017-09-23 DIAGNOSIS — D696 Thrombocytopenia, unspecified: Secondary | ICD-10-CM | POA: Diagnosis not present

## 2017-09-23 DIAGNOSIS — D631 Anemia in chronic kidney disease: Secondary | ICD-10-CM | POA: Diagnosis not present

## 2017-09-24 NOTE — Telephone Encounter (Signed)
This encounter was created in error - please disregard.

## 2017-09-25 DIAGNOSIS — D631 Anemia in chronic kidney disease: Secondary | ICD-10-CM | POA: Diagnosis not present

## 2017-09-25 DIAGNOSIS — D696 Thrombocytopenia, unspecified: Secondary | ICD-10-CM | POA: Diagnosis not present

## 2017-09-25 DIAGNOSIS — N2581 Secondary hyperparathyroidism of renal origin: Secondary | ICD-10-CM | POA: Diagnosis not present

## 2017-09-25 DIAGNOSIS — I509 Heart failure, unspecified: Secondary | ICD-10-CM | POA: Diagnosis not present

## 2017-09-25 DIAGNOSIS — E876 Hypokalemia: Secondary | ICD-10-CM | POA: Diagnosis not present

## 2017-09-25 DIAGNOSIS — N186 End stage renal disease: Secondary | ICD-10-CM | POA: Diagnosis not present

## 2017-09-26 ENCOUNTER — Other Ambulatory Visit: Payer: Self-pay

## 2017-09-26 DIAGNOSIS — I509 Heart failure, unspecified: Secondary | ICD-10-CM | POA: Diagnosis not present

## 2017-09-26 DIAGNOSIS — D631 Anemia in chronic kidney disease: Secondary | ICD-10-CM | POA: Diagnosis not present

## 2017-09-26 DIAGNOSIS — N2581 Secondary hyperparathyroidism of renal origin: Secondary | ICD-10-CM | POA: Diagnosis not present

## 2017-09-26 DIAGNOSIS — N186 End stage renal disease: Secondary | ICD-10-CM | POA: Diagnosis not present

## 2017-09-26 DIAGNOSIS — D696 Thrombocytopenia, unspecified: Secondary | ICD-10-CM | POA: Diagnosis not present

## 2017-09-26 DIAGNOSIS — E876 Hypokalemia: Secondary | ICD-10-CM | POA: Diagnosis not present

## 2017-09-26 NOTE — Patient Outreach (Signed)
[facility]) Care Management  09/26/17  Michael [name] 1961-07-25  [id number]  Attempted to reach patient without success. Left HIPAA compliant voicemail with RNCM contact information and invited patient to return call.  [name], RN, BSN, [place] Management Coordinator [phone]

## 2017-09-28 ENCOUNTER — Telehealth: Payer: Self-pay | Admitting: Pharmacist

## 2017-09-28 DIAGNOSIS — N2581 Secondary hyperparathyroidism of renal origin: Secondary | ICD-10-CM | POA: Diagnosis not present

## 2017-09-28 DIAGNOSIS — D696 Thrombocytopenia, unspecified: Secondary | ICD-10-CM | POA: Diagnosis not present

## 2017-09-28 DIAGNOSIS — N186 End stage renal disease: Secondary | ICD-10-CM | POA: Diagnosis not present

## 2017-09-28 DIAGNOSIS — E876 Hypokalemia: Secondary | ICD-10-CM | POA: Diagnosis not present

## 2017-09-28 DIAGNOSIS — I482 Chronic atrial fibrillation: Secondary | ICD-10-CM | POA: Diagnosis not present

## 2017-09-28 DIAGNOSIS — D631 Anemia in chronic kidney disease: Secondary | ICD-10-CM | POA: Diagnosis not present

## 2017-09-28 DIAGNOSIS — I509 Heart failure, unspecified: Secondary | ICD-10-CM | POA: Diagnosis not present

## 2017-09-28 LAB — PROTIME-INR: INR: 2.1 — AB (ref 0.9–1.1)

## 2017-09-28 NOTE — Telephone Encounter (Signed)
Dr. Jarold Song received home health order request from [facility] for INR. We do no manage patient's warfarin. Faxed request to [facility] on [address] who manages patient's INR.

## 2017-09-29 ENCOUNTER — Other Ambulatory Visit: Payer: Self-pay

## 2017-09-29 ENCOUNTER — Ambulatory Visit: Payer: Medicare Other | Admitting: Family Medicine

## 2017-09-29 NOTE — Patient Outreach (Signed)
[facility]) Care Management  09/29/17  Hodges Midgett 1961/07/26 [id number]  Successful outreach completed with patient. Patient identification verified.  Patient stated that he is doing ok and he does not have any concerns at present. He stated that he is taking all of his medications as prescribed and denies any shortness of breath, edema, or chest pain. He did state that [name] did not pick him up yesterday for his dialysis appointment, but a friend took him. He stated he does not know why they did not come, but he also admitted that he did not schedule it. He stated that someone else has been scheduling his transportation, but he does not know who.  RNCM discussed with patient about the need to follow up with [name] with the face to face appointment. He stated that he has the appointment on November 20 and [facility] verified that this is correct. RNCM advised that [name] will be picking him up that day and taking him to the appointment at the [place] office and they will also be bringing him back home. RNCM stressed the importance of attending this appointment and advised if he misses it, he will no longer be eligible for [name] transportation after 10/16/2017. Patient verbalized understanding.  Patient handed the phone to his nephew, [name] who was visiting with him. [name] stated that patient is doing well and has no complaints or concerns. He stated that he has taken the day off on 11/20 so that he can attend the meeting with patient at [place] office. He stated that he was not aware that [name] did not come yesterday, but stated that he tells him if he misses appointments. Patient has not missed any dialysis appointments this month. RNCM advised will follow up with [name] to ensure his appointment for tomorrow is scheduled and will follow up to find out who has been arranging his transportation. Also advised we will need to teach him how to arrange transportation by calling [name] for all his medical  appointments. [name] verbalized agreement and understanding.  Per [name], patient is doing well at present with no needs or concern. He is taking his medications and going to dialysis. They have no questions at present.  Advised RNCM will follow up within the next week to remind patient of his appointment on Tuesday 10/06/2017 and also to follow up regarding scheduling his transportation for his dialysis appointments.  [place] R. Lilburn Straw, RN, BSN, [place] Management Coordinator [phone]

## 2017-09-30 ENCOUNTER — Ambulatory Visit (INDEPENDENT_AMBULATORY_CARE_PROVIDER_SITE_OTHER): Payer: Medicare Other | Admitting: Cardiology

## 2017-09-30 DIAGNOSIS — N2581 Secondary hyperparathyroidism of renal origin: Secondary | ICD-10-CM | POA: Diagnosis not present

## 2017-09-30 DIAGNOSIS — E876 Hypokalemia: Secondary | ICD-10-CM | POA: Diagnosis not present

## 2017-09-30 DIAGNOSIS — I509 Heart failure, unspecified: Secondary | ICD-10-CM | POA: Diagnosis not present

## 2017-09-30 DIAGNOSIS — N186 End stage renal disease: Secondary | ICD-10-CM | POA: Diagnosis not present

## 2017-09-30 DIAGNOSIS — Z5181 Encounter for therapeutic drug level monitoring: Secondary | ICD-10-CM | POA: Diagnosis not present

## 2017-09-30 DIAGNOSIS — D696 Thrombocytopenia, unspecified: Secondary | ICD-10-CM | POA: Diagnosis not present

## 2017-09-30 DIAGNOSIS — I059 Rheumatic mitral valve disease, unspecified: Secondary | ICD-10-CM | POA: Diagnosis not present

## 2017-09-30 DIAGNOSIS — I079 Rheumatic tricuspid valve disease, unspecified: Secondary | ICD-10-CM

## 2017-09-30 DIAGNOSIS — D631 Anemia in chronic kidney disease: Secondary | ICD-10-CM | POA: Diagnosis not present

## 2017-10-01 ENCOUNTER — Telehealth: Payer: Self-pay | Admitting: Family Medicine

## 2017-10-01 NOTE — Telephone Encounter (Signed)
Call placed to patient [phone] to check on his status and reschedule missed appointment. No answer. Left patient a message to return my call at [phone].

## 2017-10-02 DIAGNOSIS — E876 Hypokalemia: Secondary | ICD-10-CM | POA: Diagnosis not present

## 2017-10-02 DIAGNOSIS — D631 Anemia in chronic kidney disease: Secondary | ICD-10-CM | POA: Diagnosis not present

## 2017-10-02 DIAGNOSIS — N186 End stage renal disease: Secondary | ICD-10-CM | POA: Diagnosis not present

## 2017-10-02 DIAGNOSIS — I509 Heart failure, unspecified: Secondary | ICD-10-CM | POA: Diagnosis not present

## 2017-10-02 DIAGNOSIS — D696 Thrombocytopenia, unspecified: Secondary | ICD-10-CM | POA: Diagnosis not present

## 2017-10-02 DIAGNOSIS — N2581 Secondary hyperparathyroidism of renal origin: Secondary | ICD-10-CM | POA: Diagnosis not present

## 2017-10-03 DIAGNOSIS — D631 Anemia in chronic kidney disease: Secondary | ICD-10-CM | POA: Diagnosis not present

## 2017-10-03 DIAGNOSIS — E876 Hypokalemia: Secondary | ICD-10-CM | POA: Diagnosis not present

## 2017-10-03 DIAGNOSIS — D696 Thrombocytopenia, unspecified: Secondary | ICD-10-CM | POA: Diagnosis not present

## 2017-10-03 DIAGNOSIS — I509 Heart failure, unspecified: Secondary | ICD-10-CM | POA: Diagnosis not present

## 2017-10-03 DIAGNOSIS — N186 End stage renal disease: Secondary | ICD-10-CM | POA: Diagnosis not present

## 2017-10-03 DIAGNOSIS — N2581 Secondary hyperparathyroidism of renal origin: Secondary | ICD-10-CM | POA: Diagnosis not present

## 2017-10-05 ENCOUNTER — Other Ambulatory Visit: Payer: Self-pay

## 2017-10-05 DIAGNOSIS — D696 Thrombocytopenia, unspecified: Secondary | ICD-10-CM | POA: Diagnosis not present

## 2017-10-05 DIAGNOSIS — N186 End stage renal disease: Secondary | ICD-10-CM | POA: Diagnosis not present

## 2017-10-05 DIAGNOSIS — D631 Anemia in chronic kidney disease: Secondary | ICD-10-CM | POA: Diagnosis not present

## 2017-10-05 DIAGNOSIS — E876 Hypokalemia: Secondary | ICD-10-CM | POA: Diagnosis not present

## 2017-10-05 DIAGNOSIS — I509 Heart failure, unspecified: Secondary | ICD-10-CM | POA: Diagnosis not present

## 2017-10-05 DIAGNOSIS — N2581 Secondary hyperparathyroidism of renal origin: Secondary | ICD-10-CM | POA: Diagnosis not present

## 2017-10-06 ENCOUNTER — Ambulatory Visit: Payer: Medicare Other | Admitting: Family Medicine

## 2017-10-06 ENCOUNTER — Other Ambulatory Visit: Payer: Self-pay

## 2017-10-06 MED FILL — CALCIUM ACETATE 667 MG CAP: 667 | 30 days supply | Qty: 90 | Fill #1 | Status: TO

## 2017-10-06 MED FILL — ATORVASTATIN 20 MG TABLET: 20 | 30 days supply | Qty: 30 | Fill #2 | Status: TO

## 2017-10-06 MED FILL — ALLOPURINOL 300 MG TABS: 300 | 30 days supply | Qty: 15 | Fill #2

## 2017-10-06 MED FILL — OMEPRAZOLE DR 20 MG CAPSULE: 20 | 30 days supply | Qty: 30 | Fill #3

## 2017-10-06 NOTE — Patient Outreach (Signed)
[facility]) Care Management  [place]  10/06/2017   Michael [name] [birth year] [id number]   Home visit completed with [name] and his nephew, Michael [name]. Updated consent obtained with Michael [name].  [name] was getting ready for his appointment with the [name] office for approval of [name] services going forward. [name] was to arrive between 9:20 am and 9:50 am, but [name] needed assistance getting medications refilled this morning.  Subjective:   Objective: Blood pressure 108/70, pulse 85, SpO2 95 %.  Encounter Medications:  Outpatient Encounter Medications as of 10/06/2017  Medication Sig Note  . acetaminophen (TYLENOL) 325 MG tablet Take 650 mg by mouth every 6 (six) hours.    Marland Kitchen acetaminophen-codeine (TYLENOL #3) 300-30 MG tablet Take 1 tablet by mouth every 12 (twelve) hours as needed for moderate pain. ([name] taking differently: Take 1 tablet by mouth 2 (two) times daily. )   . allopurinol (ZYLOPRIM) 300 MG tablet Take 0.5 tablets (150 mg total) by mouth daily.   Marland Kitchen atorvastatin (LIPITOR) 20 MG tablet Take 1 tablet (20 mg total) by mouth daily.   . calcium acetate (PHOSLO) 667 MG capsule Take 1 capsule (667 mg total) by mouth 3 (three) times daily with meals. ([name] taking differently: Take 667-2,001 mg by mouth See admin instructions. Pt takes 2,001mg  by mouth three times daily and 667mg  twice daily with snacks)   . colchicine 0.6 MG tablet Take 0.5 tablets (0.3 mg total) by mouth 2 (two) times a week.   . diclofenac sodium (VOLTAREN) 1 % GEL Apply 4 g topically 4 (four) times daily.   Marland Kitchen docusate sodium (COLACE) 100 MG capsule Take 1 capsule (100 mg total) by mouth 2 (two) times daily.   . folic acid (FOLVITE) 1 MG tablet TAKE 1 TABLET BY MOUTH DAILY. ([name] taking differently: TAKE 1MG  BY MOUTH ONCE DAILY)   . metoprolol succinate (TOPROL-XL) 25 MG 24 hr  tablet Take 1 tablet (25 mg total) by mouth 2 (two) times daily. ([name] taking differently: Take 25 mg by mouth daily. )   . midodrine (PROAMATINE) 5 MG tablet Take 2 tablets (10 mg total) by mouth 3 (three) times daily with meals.   Marland Kitchen omeprazole (PRILOSEC) 20 MG capsule Take 1 capsule (20 mg total) by mouth daily.   . OXYGEN Inhale 2 L into the lungs daily. 08/04/2017: [name] uses as needed  . tiZANidine (ZANAFLEX) 4 MG tablet Take 1 tablet (4 mg total) by mouth every 8 (eight) hours as needed for muscle spasms.   Marland Kitchen warfarin (COUMADIN) 2.5 MG tablet Take 1 tablet (2.5 mg total) by mouth daily at 6 PM. Or as directed by coumadin clinic    No facility-administered encounter medications on file as of 10/06/2017.     Functional Status:  In your present state of health, do you have any difficulty performing the following activities: 09/18/2017 09/03/2017  Hearing? N N  Vision? N N  Difficulty concentrating or making decisions? N N  Walking or climbing stairs? N Y  Dressing or bathing? N N  Doing errands, shopping? N Y  Conservation officer, nature and eating ? - -  Using the Toilet? - -  In the past six months, have you accidently leaked urine? - -  Do you have problems with loss of bowel control? - -  Managing your Medications? - -  Managing your Finances? - -  Housekeeping or managing  your Housekeeping? - -  Some recent data might be hidden    Fall/Depression Screening: Fall Risk  08/04/2017 07/31/2017 06/25/2017  Falls in the past year? Yes Yes No  Number falls in past yr: - 2 or more -  Injury with Fall? - Yes -  Risk Factor Category  - High Fall Risk -  Risk for fall due to : - History of fall(s);Impaired balance/gait;Impaired mobility -  Follow up - Education provided;Falls prevention discussed -   PHQ 2/9 Scores 07/31/2017 06/25/2017 03/03/2017 12/12/2016 04/22/2016 03/04/2016 02/12/2016  PHQ - 2 Score 4 4 0 0 0 0 0  PHQ- 9 Score 7 15 - 2 - - -    Assessment:  [name] stated that home health was  filling his pill box, but they are no longer coming out. His nephew did not fill the pillbox, but reported that [name]'s "friend" filled it for him, He stated he is not sure if it is accurate or not. He also was concerned that [name] is out of several of his medications. During review, [name] was not completely out of any of his medications. However, he will be within the next week. RNCM called pharmacy to request refills of [name]'s allopurinol, atorvastatin, calcium acetate, colchicine, omeprazole and warfarin. RNCM reorganized [name]'s pillbox, making sure that all of his medications were in there correctly for the next week. Will make a pharmacy referral to evaluate for medication adherence and assist with possible pill packaging to make it easier as [name] does not have someone who can consistently fill his pill box.  [name] has no complaints today. He denies any chest pain, shortness of breath or complaints today.   Plan:  RNCM to place a referral for [place].  Will continue to follow for transition of care.  Michael R. Vamsi Apfel, RN, BSN, [place] Management Coordinator [phone]

## 2017-10-07 ENCOUNTER — Ambulatory Visit: Payer: Medicare Other | Admitting: Family Medicine

## 2017-10-07 DIAGNOSIS — N186 End stage renal disease: Secondary | ICD-10-CM | POA: Diagnosis not present

## 2017-10-07 DIAGNOSIS — D696 Thrombocytopenia, unspecified: Secondary | ICD-10-CM | POA: Diagnosis not present

## 2017-10-07 DIAGNOSIS — I482 Chronic atrial fibrillation: Secondary | ICD-10-CM | POA: Diagnosis not present

## 2017-10-07 DIAGNOSIS — E876 Hypokalemia: Secondary | ICD-10-CM | POA: Diagnosis not present

## 2017-10-07 DIAGNOSIS — I509 Heart failure, unspecified: Secondary | ICD-10-CM | POA: Diagnosis not present

## 2017-10-07 DIAGNOSIS — D631 Anemia in chronic kidney disease: Secondary | ICD-10-CM | POA: Diagnosis not present

## 2017-10-07 DIAGNOSIS — N2581 Secondary hyperparathyroidism of renal origin: Secondary | ICD-10-CM | POA: Diagnosis not present

## 2017-10-09 DIAGNOSIS — D631 Anemia in chronic kidney disease: Secondary | ICD-10-CM | POA: Diagnosis not present

## 2017-10-09 DIAGNOSIS — N2581 Secondary hyperparathyroidism of renal origin: Secondary | ICD-10-CM | POA: Diagnosis not present

## 2017-10-09 DIAGNOSIS — D696 Thrombocytopenia, unspecified: Secondary | ICD-10-CM | POA: Diagnosis not present

## 2017-10-09 DIAGNOSIS — E876 Hypokalemia: Secondary | ICD-10-CM | POA: Diagnosis not present

## 2017-10-09 DIAGNOSIS — N186 End stage renal disease: Secondary | ICD-10-CM | POA: Diagnosis not present

## 2017-10-09 DIAGNOSIS — I509 Heart failure, unspecified: Secondary | ICD-10-CM | POA: Diagnosis not present

## 2017-10-12 DIAGNOSIS — I509 Heart failure, unspecified: Secondary | ICD-10-CM | POA: Diagnosis not present

## 2017-10-12 DIAGNOSIS — N186 End stage renal disease: Secondary | ICD-10-CM | POA: Diagnosis not present

## 2017-10-12 DIAGNOSIS — I482 Chronic atrial fibrillation: Secondary | ICD-10-CM | POA: Diagnosis not present

## 2017-10-12 DIAGNOSIS — N2581 Secondary hyperparathyroidism of renal origin: Secondary | ICD-10-CM | POA: Diagnosis not present

## 2017-10-12 DIAGNOSIS — D696 Thrombocytopenia, unspecified: Secondary | ICD-10-CM | POA: Diagnosis not present

## 2017-10-12 DIAGNOSIS — D631 Anemia in chronic kidney disease: Secondary | ICD-10-CM | POA: Diagnosis not present

## 2017-10-12 DIAGNOSIS — E876 Hypokalemia: Secondary | ICD-10-CM | POA: Diagnosis not present

## 2017-10-13 ENCOUNTER — Ambulatory Visit: Payer: Medicare Other | Attending: Family Medicine | Admitting: Family Medicine

## 2017-10-13 ENCOUNTER — Encounter: Payer: Self-pay | Admitting: Family Medicine

## 2017-10-13 VITALS — BP 105/75 | HR 64 | Temp 98.1°F | Ht 65.0 in | Wt 154.2 lb

## 2017-10-13 DIAGNOSIS — K219 Gastro-esophageal reflux disease without esophagitis: Secondary | ICD-10-CM | POA: Diagnosis not present

## 2017-10-13 DIAGNOSIS — I132 Hypertensive heart and chronic kidney disease with heart failure and with stage 5 chronic kidney disease, or end stage renal disease: Secondary | ICD-10-CM | POA: Insufficient documentation

## 2017-10-13 DIAGNOSIS — I10 Essential (primary) hypertension: Secondary | ICD-10-CM | POA: Diagnosis not present

## 2017-10-13 DIAGNOSIS — E1122 Type 2 diabetes mellitus with diabetic chronic kidney disease: Secondary | ICD-10-CM | POA: Diagnosis not present

## 2017-10-13 DIAGNOSIS — E785 Hyperlipidemia, unspecified: Secondary | ICD-10-CM | POA: Insufficient documentation

## 2017-10-13 DIAGNOSIS — Z992 Dependence on renal dialysis: Secondary | ICD-10-CM

## 2017-10-13 DIAGNOSIS — Z7901 Long term (current) use of anticoagulants: Secondary | ICD-10-CM | POA: Insufficient documentation

## 2017-10-13 DIAGNOSIS — I272 Pulmonary hypertension, unspecified: Secondary | ICD-10-CM | POA: Diagnosis not present

## 2017-10-13 DIAGNOSIS — I420 Dilated cardiomyopathy: Secondary | ICD-10-CM

## 2017-10-13 DIAGNOSIS — E1169 Type 2 diabetes mellitus with other specified complication: Secondary | ICD-10-CM

## 2017-10-13 DIAGNOSIS — Z79899 Other long term (current) drug therapy: Secondary | ICD-10-CM | POA: Diagnosis not present

## 2017-10-13 DIAGNOSIS — G4733 Obstructive sleep apnea (adult) (pediatric): Secondary | ICD-10-CM | POA: Insufficient documentation

## 2017-10-13 DIAGNOSIS — M1A00X0 Idiopathic chronic gout, unspecified site, without tophus (tophi): Secondary | ICD-10-CM | POA: Diagnosis not present

## 2017-10-13 DIAGNOSIS — I482 Chronic atrial fibrillation: Secondary | ICD-10-CM

## 2017-10-13 DIAGNOSIS — I5022 Chronic systolic (congestive) heart failure: Secondary | ICD-10-CM | POA: Diagnosis not present

## 2017-10-13 DIAGNOSIS — N186 End stage renal disease: Secondary | ICD-10-CM

## 2017-10-13 DIAGNOSIS — Z794 Long term (current) use of insulin: Secondary | ICD-10-CM

## 2017-10-13 DIAGNOSIS — I2510 Atherosclerotic heart disease of native coronary artery without angina pectoris: Secondary | ICD-10-CM | POA: Insufficient documentation

## 2017-10-13 DIAGNOSIS — Z953 Presence of xenogenic heart valve: Secondary | ICD-10-CM | POA: Insufficient documentation

## 2017-10-13 DIAGNOSIS — I4821 Permanent atrial fibrillation: Secondary | ICD-10-CM

## 2017-10-13 LAB — GLUCOSE, POCT (MANUAL RESULT ENTRY): POC Glucose: 107 mg/dL — AB (ref 70–99)

## 2017-10-13 NOTE — Progress Notes (Signed)
Subjective:  Patient ID: Michael [name], male    DOB: 25-Oct-1961  Age: 56 y.o. MRN: [id number]  CC: Diabetes   HPI Deovion Ocain is a 56 year old male with a history of Type 2DM (A1c 5.2), hypertension, hyperlipidemia, mitral valve replacement with porcine valve, tricuspid valve repair, atrial fibrillation (on anticoagulation with Coumadin and rate control with metoprolol),Chronic systolic heartfailure  (EF 50-55% from 07/2017 ), Cirrhosis, ESRD who presents today for follow-up from hospitalization for acute on chronic CHF.  He had presented to the ED due to shortness of breath during dialysis and was found to be fluid overloaded on presentation. Inpatient dialysis sessions were continued which he tolerated and his home medications were continued. His medications were adjusted according to renal dose and he was subsequently discharged once stable.  He presents today accompanied by the interpreter and denies joint pains from gout flare, shortness of breath but endorses a decreased appetite due to decreased sense of taste.  He lives alone and cooks for himself and at other times his friends make meals for him. He has previously refused PCS or home nursing services. He has no acute concerns today.  Past Medical History:  Diagnosis Date  . Anemia of chronic disease   . Asthma    said he does not know  . Chronic kidney disease (CKD), stage IV (severe) ([place])   . Chronic systolic CHF (congestive heart failure) (HCC)    a. prior EF normal; b. Echo 10/16: Mild LVH, EF 30-35%, mitral valve bioprosthesis present without evidence of stenosis or regurgitation, severe LAE, mild RVE with mild to moderately reduced RVSF, moderate RAE  . Coronary artery disease   . Diabetes mellitus type 2 in obese ([place])   . Diabetes mellitus without complication ([place])   . Dyspnea   . GERD (gastroesophageal reflux disease)   . H/O tricuspid valve repair 2012  . History of echocardiogram    Echo at [facility] in  [facility] 4/12: mod LVH, EF 60-65%, mod LAE, tissue MVR ok, mod TR, mod pulmo HTN with RVSP 48 mmHg  . Hypertension   . Obstructive sleep apnea   . Paroxysmal atrial fibrillation (HCC)    s/p Maze procedure at time of MVR in 2011  . Pulmonary HTN ([place])   . 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 AV FISTULA PLACEMENT Left 07/13/2017   Procedure: LEFT ARM ARTERIOVENOUS (AV) FISTULA CREATION;  Surgeon: Waynetta Sandy, MD;  Location: [place];  Service: Vascular;  Laterality: Left;  . CARDIAC SURGERY    . CARDIAC VALVE REPLACEMENT  2011  . CARDIOVERSION N/A 07/09/2017   Procedure: CARDIOVERSION;  Surgeon: Larey Dresser, MD;  Location: [facility] ENDOSCOPY;  Service: Cardiovascular;  Laterality: N/A;  . COLONOSCOPY N/A 07/16/2017   Procedure: COLONOSCOPY;  Surgeon: Jerene Bears, MD;  Location: [place];  Service: Gastroenterology;  Laterality: N/A;  . ESOPHAGOGASTRODUODENOSCOPY N/A 07/16/2017   Procedure: ESOPHAGOGASTRODUODENOSCOPY (EGD);  Surgeon: Jerene Bears, MD;  Location: [facility] ENDOSCOPY;  Service: Gastroenterology;  Laterality: N/A;  . INSERTION OF DIALYSIS CATHETER Right 07/13/2017   Procedure: INSERTION OF DIALYSIS CATHETER RIGHT INTERNAL JUGULAR;  Surgeon: Waynetta Sandy, MD;  Location: [place];  Service: Vascular;  Laterality: Right;  . RIGHT HEART CATH N/A 06/29/2017   Procedure: RIGHT HEART CATH;  Surgeon: Larey Dresser, MD;  Location: [place] CV LAB;  Service: Cardiovascular;  Laterality: N/A;  . RIGHT/LEFT HEART CATH  AND CORONARY ANGIOGRAPHY N/A 08/19/2017   Procedure: RIGHT/LEFT HEART CATH AND CORONARY ANGIOGRAPHY;  Surgeon: Leonie Man, MD;  Location: [place] CV LAB;  Service: Cardiovascular;  Laterality: N/A;  . TEE WITHOUT CARDIOVERSION N/A 07/09/2017   Procedure: TRANSESOPHAGEAL ECHOCARDIOGRAM (TEE);  Surgeon: Larey Dresser, MD;  Location: [facility] ENDOSCOPY;  Service: Cardiovascular;   Laterality: N/A;    No Known Allergies   Outpatient Medications Prior to Visit  Medication Sig Dispense Refill  . acetaminophen (TYLENOL) 325 MG tablet Take 650 mg by mouth every 6 (six) hours.     Marland Kitchen acetaminophen-codeine (TYLENOL #3) 300-30 MG tablet Take 1 tablet by mouth every 12 (twelve) hours as needed for moderate pain. (Patient taking differently: Take 1 tablet by mouth 2 (two) times daily. ) 40 tablet 1  . allopurinol (ZYLOPRIM) 300 MG tablet Take 0.5 tablets (150 mg total) by mouth daily. 15 tablet 3  . atorvastatin (LIPITOR) 20 MG tablet Take 1 tablet (20 mg total) by mouth daily. 30 tablet 3  . calcium acetate (PHOSLO) 667 MG capsule Take 1 capsule (667 mg total) by mouth 3 (three) times daily with meals. (Patient taking differently: Take 667-2,001 mg by mouth See admin instructions. Pt takes 2,001mg  by mouth three times daily and 667mg  twice daily with snacks) 90 capsule 3  . colchicine 0.6 MG tablet Take 0.5 tablets (0.3 mg total) by mouth 2 (two) times a week. 30 tablet 0  . diclofenac sodium (VOLTAREN) 1 % GEL Apply 4 g topically 4 (four) times daily.    Marland Kitchen docusate sodium (COLACE) 100 MG capsule Take 1 capsule (100 mg total) by mouth 2 (two) times daily. 10 capsule 0  . folic acid (FOLVITE) 1 MG tablet TAKE 1 TABLET BY MOUTH DAILY. (Patient taking differently: TAKE 1MG  BY MOUTH ONCE DAILY) 30 tablet 2  . metoprolol succinate (TOPROL-XL) 25 MG 24 hr tablet Take 1 tablet (25 mg total) by mouth 2 (two) times daily. (Patient taking differently: Take 25 mg by mouth daily. ) 60 tablet 6  . midodrine (PROAMATINE) 5 MG tablet Take 2 tablets (10 mg total) by mouth 3 (three) times daily with meals. 90 tablet 2  . omeprazole (PRILOSEC) 20 MG capsule Take 1 capsule (20 mg total) by mouth daily. 30 capsule 5  . OXYGEN Inhale 2 L into the lungs daily.    Marland Kitchen warfarin (COUMADIN) 2.5 MG tablet Take 1 tablet (2.5 mg total) by mouth daily at 6 PM. Or as directed by coumadin clinic 30 tablet 0  .  tiZANidine (ZANAFLEX) 4 MG tablet Take 1 tablet (4 mg total) by mouth every 8 (eight) hours as needed for muscle spasms. (Patient not taking: Reported on 10/13/2017) 60 tablet 1   No facility-administered medications prior to visit.     ROS Review of Systems  Constitutional: Positive for appetite change. Negative for activity change.  HENT: Negative for sinus pressure and sore throat.   Eyes: Negative for visual disturbance.  Respiratory: Negative for cough, chest tightness and shortness of breath.   Cardiovascular: Negative for chest pain and leg swelling.  Gastrointestinal: Negative for abdominal distention, abdominal pain, constipation and diarrhea.  Endocrine: Negative.   Genitourinary: Negative for dysuria.  Musculoskeletal: Negative for joint swelling and myalgias.  Skin: Negative for rash.  Allergic/Immunologic: Negative.   Neurological: Negative for weakness, light-headedness and numbness.  Psychiatric/Behavioral: Negative for dysphoric mood and suicidal ideas.    Objective:  BP 105/75   Pulse 64   Temp 98.1 F (36.7  C) (Oral)   Ht 5\' 5"  (1.651 m)   Wt 154 lb 3.2 oz (69.9 kg)   SpO2 100%   BMI 25.66 kg/m   BP/Weight 10/13/2017 10/06/2017 25/01/6643  Systolic BP 034 742 595  Diastolic BP 75 70 75  Wt. (Lbs) 154.2 - -  BMI 25.66 - -      Physical Exam  Constitutional: He is oriented to person, place, and time. He appears well-developed and well-nourished.  Cardiovascular: Normal rate, normal heart sounds and intact distal pulses.  No murmur heard. Pulmonary/Chest: Effort normal and breath sounds normal. He has no wheezes. He has no rales. He exhibits no tenderness.  Right upper chest wall catheter  Abdominal: Soft. Bowel sounds are normal. He exhibits no distension and no mass. There is no tenderness.  Musculoskeletal: Normal range of motion.  Left brachiocephalic AV fistula with palpable thrill, surrounding contusion  Neurological: He is alert and oriented to  person, place, and time.  Skin: Skin is warm and dry.  Psychiatric: He has a normal mood and affect.    Lab Results  Component Value Date   HGBA1C 5.2 09/01/2017     Assessment & Plan:   1. Type 2 diabetes mellitus with other specified complication, without long-term current use of insulin (HCC) Controlled with A1c of 5.2 He is currently on diet control Diabetic diet - POCT glucose (manual entry)  2. Permanent atrial fibrillation (HCC) On anticoagulation with Coumadin; last INR was 2.1 on 09/28/17 Follow-up with the Coumadin clinic; next appointment in 3 days. Rate controlled with metoprolol  3. Essential hypertension Controlled Low-sodium diet  4. Dilated cardiomyopathy (HCC) EF 50-55%, mildly reduced systolic function, severe RV dilatation, moderate pulmonary hypertension from 2D echo of 08/06/2017 No evidence of fluid overload  5. ESRD (end stage renal disease) on dialysis [facility]) Continue hemodialysis as per protocol  6. Idiopathic chronic gout without tophus, unspecified site No acute flares   No orders of the defined types were placed in this encounter.   Follow-up: Return in about 3 months (around 01/13/2018) for follow up on Diabetes.   [name] MD

## 2017-10-14 ENCOUNTER — Other Ambulatory Visit: Payer: Self-pay | Admitting: Pharmacist

## 2017-10-14 ENCOUNTER — Encounter: Payer: Self-pay | Admitting: Family Medicine

## 2017-10-14 DIAGNOSIS — E876 Hypokalemia: Secondary | ICD-10-CM | POA: Diagnosis not present

## 2017-10-14 DIAGNOSIS — N186 End stage renal disease: Secondary | ICD-10-CM | POA: Diagnosis not present

## 2017-10-14 DIAGNOSIS — D631 Anemia in chronic kidney disease: Secondary | ICD-10-CM | POA: Diagnosis not present

## 2017-10-14 DIAGNOSIS — D696 Thrombocytopenia, unspecified: Secondary | ICD-10-CM | POA: Diagnosis not present

## 2017-10-14 DIAGNOSIS — N2581 Secondary hyperparathyroidism of renal origin: Secondary | ICD-10-CM | POA: Diagnosis not present

## 2017-10-14 DIAGNOSIS — I509 Heart failure, unspecified: Secondary | ICD-10-CM | POA: Diagnosis not present

## 2017-10-14 NOTE — Patient Outreach (Signed)
[facility]) Care Management  Late entry for 10/06/2017  Michael [name] 1961-06-10 [id number]   RNCM received voicemail from patient's nephew, [name] regarding appointment time tomorrow for patient to go to [name] office.  RNCM returned call to patient's nephew. Patient identification verified. He stated that he was aware of the appointment tomorrow and would be present in the morning to make sure he is ready to go to the appointment.  He stated he was concerned because someone had filled patient's pillbox, but he is not sure who did it and he could not locate all of his medications. He is concerned that patient is out of several of his medications.   Plan: Home visit was scheduled for in the morning so that RNCM could review his medications and request refills for whatever was needed. Will review his pillbox for accuracy and plan to make a referral to [place] for assistance as well.  [place] R. Klaus Casteneda, RN, BSN, [place] Management Coordinator [phone]

## 2017-10-14 NOTE — Patient Outreach (Signed)
[facility]) Care Management  10/14/2017  [name] Bays Nov 14, 1961 [id number]  Patient was referred to [place] Pharmacist by [place] for assistance with review of pill box and pill packaging.    Successful phone outreach to patient, HIPAA details verified.  Explained purpose of call and patient agreed to call.  It is apparent during call patient may benefit from [facility] Pharmacist home visit and this was offered to patient.  He only has Tuesdays and Thursdays available due to dialysis.    Patient initially declined scheduling home visit for next week due to not knowing his schedule, but later agreed.    Plan:  Home visit scheduled for next week. Anticipate patient may need to explore alternative packaging options for his medications based on notes from [place].    [name], PharmD, [place] [phone]

## 2017-10-15 ENCOUNTER — Ambulatory Visit (INDEPENDENT_AMBULATORY_CARE_PROVIDER_SITE_OTHER): Payer: Medicare Other | Admitting: *Deleted

## 2017-10-15 DIAGNOSIS — I059 Rheumatic mitral valve disease, unspecified: Secondary | ICD-10-CM

## 2017-10-15 DIAGNOSIS — I079 Rheumatic tricuspid valve disease, unspecified: Secondary | ICD-10-CM | POA: Diagnosis not present

## 2017-10-15 DIAGNOSIS — Z5181 Encounter for therapeutic drug level monitoring: Secondary | ICD-10-CM

## 2017-10-15 LAB — POCT INR: INR: 1.8

## 2017-10-15 MED ORDER — WARFARIN SODIUM 2.5 MG PO TABS: 2.5000 mg | ORAL_TABLET | Freq: Every day | ORAL | 1 refills | Status: DC

## 2017-10-15 NOTE — Patient Instructions (Signed)
Today take 1.5 tablets (3.75mg ) then continue taking same dosage 1 tablet ( 2.5mg ) daily. Recheck INR in 2 weeks.

## 2017-10-16 DIAGNOSIS — N186 End stage renal disease: Secondary | ICD-10-CM | POA: Diagnosis not present

## 2017-10-16 DIAGNOSIS — N2581 Secondary hyperparathyroidism of renal origin: Secondary | ICD-10-CM | POA: Diagnosis not present

## 2017-10-16 DIAGNOSIS — Z992 Dependence on renal dialysis: Secondary | ICD-10-CM | POA: Diagnosis not present

## 2017-10-16 DIAGNOSIS — D631 Anemia in chronic kidney disease: Secondary | ICD-10-CM | POA: Diagnosis not present

## 2017-10-16 DIAGNOSIS — D696 Thrombocytopenia, unspecified: Secondary | ICD-10-CM | POA: Diagnosis not present

## 2017-10-16 DIAGNOSIS — I509 Heart failure, unspecified: Secondary | ICD-10-CM | POA: Diagnosis not present

## 2017-10-16 DIAGNOSIS — E876 Hypokalemia: Secondary | ICD-10-CM | POA: Diagnosis not present

## 2017-10-17 ENCOUNTER — Other Ambulatory Visit: Payer: Self-pay

## 2017-10-17 DIAGNOSIS — N2581 Secondary hyperparathyroidism of renal origin: Secondary | ICD-10-CM | POA: Diagnosis not present

## 2017-10-17 DIAGNOSIS — I509 Heart failure, unspecified: Secondary | ICD-10-CM | POA: Diagnosis not present

## 2017-10-17 DIAGNOSIS — I1310 Hypertensive heart and chronic kidney disease without heart failure, with stage 1 through stage 4 chronic kidney disease, or unspecified chronic kidney disease: Secondary | ICD-10-CM | POA: Diagnosis not present

## 2017-10-17 DIAGNOSIS — N186 End stage renal disease: Secondary | ICD-10-CM | POA: Diagnosis not present

## 2017-10-17 DIAGNOSIS — D631 Anemia in chronic kidney disease: Secondary | ICD-10-CM | POA: Diagnosis not present

## 2017-10-17 DIAGNOSIS — E876 Hypokalemia: Secondary | ICD-10-CM | POA: Diagnosis not present

## 2017-10-17 NOTE — Patient Outreach (Signed)
[facility] Care Management  10/17/17  Chayne Mellone Dec 19, 1960 [id number]  Successful outreach completed with patient. Patient identification verified.   Patient stated that he is doing "ok." He currently has no complaints or concerns. He stated that he had a doctor's appointment with his PCP last week and that went ok. He verified that he was able to pick up all of his prescriptions and he now has everything he is supposed to take. When RNCM was at last home visit, RNCM filled his pillbox, but also ensured that his nephew is now keeping all of his medications in the same place so that he can locate them when he runs out.  Patient has a home visit this week with [facility] pharmacist on 12/4. Patient stated that he did not remember talking with the pharmacist and asked about the appointment. RNCM provided education about [facility] pharmacist and explained that that he will be coming out to review patient's medications and discuss options because there will need to be someone available to fill his pillbox accurately. Patient verbalized understanding and is still agreeable.  Patient reported that his transportation is now taken care of with [name]. He denies any chest pain or shortness of breath. He stated that he is doing ok and has no needs at present.  Plan: RNCM will continue to follow patient for transition of care.  [place] R. Aamori Mcmasters, RN, BSN, [place] Management Coordinator [phone]

## 2017-10-19 DIAGNOSIS — D631 Anemia in chronic kidney disease: Secondary | ICD-10-CM | POA: Diagnosis not present

## 2017-10-19 DIAGNOSIS — I509 Heart failure, unspecified: Secondary | ICD-10-CM | POA: Diagnosis not present

## 2017-10-19 DIAGNOSIS — I482 Chronic atrial fibrillation: Secondary | ICD-10-CM | POA: Diagnosis not present

## 2017-10-19 DIAGNOSIS — I1310 Hypertensive heart and chronic kidney disease without heart failure, with stage 1 through stage 4 chronic kidney disease, or unspecified chronic kidney disease: Secondary | ICD-10-CM | POA: Diagnosis not present

## 2017-10-19 DIAGNOSIS — N2581 Secondary hyperparathyroidism of renal origin: Secondary | ICD-10-CM | POA: Diagnosis not present

## 2017-10-19 DIAGNOSIS — N186 End stage renal disease: Secondary | ICD-10-CM | POA: Diagnosis not present

## 2017-10-19 DIAGNOSIS — E876 Hypokalemia: Secondary | ICD-10-CM | POA: Diagnosis not present

## 2017-10-20 ENCOUNTER — Other Ambulatory Visit: Payer: Self-pay | Admitting: Pharmacist

## 2017-10-20 NOTE — Patient Outreach (Signed)
[facility]) Care Management  10/20/2017  Michael [name] 11-Oct-1961 [id number]  Patient was referred to [place] Pharmacist by [facility] RN due to patient difficulty managing his pill box.    Arrived at patient's residence as scheduled, no answer at door.  Call placed to patient---he answered and states he needs to re-schedule---he seemed to have forgotten purpose of home visit.  Offered to re-schedule for next week, he accepted and handed phone to his friend.  Of note, patient was at home per his report but requested a re-scheduled appointment.  Patient even came to door while re-scheduling appointment but did not wish to have home visit today.    Plan:  Home visit re-scheduled for next week.   [name], PharmD, [place] [phone]

## 2017-10-21 DIAGNOSIS — I509 Heart failure, unspecified: Secondary | ICD-10-CM | POA: Diagnosis not present

## 2017-10-21 DIAGNOSIS — N2581 Secondary hyperparathyroidism of renal origin: Secondary | ICD-10-CM | POA: Diagnosis not present

## 2017-10-21 DIAGNOSIS — I1310 Hypertensive heart and chronic kidney disease without heart failure, with stage 1 through stage 4 chronic kidney disease, or unspecified chronic kidney disease: Secondary | ICD-10-CM | POA: Diagnosis not present

## 2017-10-21 DIAGNOSIS — D631 Anemia in chronic kidney disease: Secondary | ICD-10-CM | POA: Diagnosis not present

## 2017-10-21 DIAGNOSIS — E876 Hypokalemia: Secondary | ICD-10-CM | POA: Diagnosis not present

## 2017-10-21 DIAGNOSIS — N186 End stage renal disease: Secondary | ICD-10-CM | POA: Diagnosis not present

## 2017-10-23 ENCOUNTER — Ambulatory Visit (INDEPENDENT_AMBULATORY_CARE_PROVIDER_SITE_OTHER): Payer: Medicare Other | Admitting: Vascular Surgery

## 2017-10-23 ENCOUNTER — Encounter: Payer: Self-pay | Admitting: Vascular Surgery

## 2017-10-23 ENCOUNTER — Other Ambulatory Visit: Payer: Self-pay

## 2017-10-23 ENCOUNTER — Encounter: Payer: Self-pay | Admitting: *Deleted

## 2017-10-23 ENCOUNTER — Ambulatory Visit (HOSPITAL_COMMUNITY)
Admission: RE | Admit: 2017-10-23 | Discharge: 2017-10-23 | Disposition: A | Discharge status: Home / Self Care | Payer: Medicare Other | Source: Ambulatory Visit | Attending: Vascular Surgery | Admitting: Vascular Surgery

## 2017-10-23 VITALS — BP 94/61 | HR 115 | Temp 97.0°F | Resp 16 | Ht 65.0 in | Wt 152.0 lb

## 2017-10-23 DIAGNOSIS — N186 End stage renal disease: Secondary | ICD-10-CM | POA: Diagnosis not present

## 2017-10-23 DIAGNOSIS — E876 Hypokalemia: Secondary | ICD-10-CM | POA: Diagnosis not present

## 2017-10-23 DIAGNOSIS — Z48812 Encounter for surgical aftercare following surgery on the circulatory system: Secondary | ICD-10-CM | POA: Diagnosis not present

## 2017-10-23 DIAGNOSIS — Z992 Dependence on renal dialysis: Secondary | ICD-10-CM | POA: Diagnosis not present

## 2017-10-23 DIAGNOSIS — N2581 Secondary hyperparathyroidism of renal origin: Secondary | ICD-10-CM | POA: Diagnosis not present

## 2017-10-23 DIAGNOSIS — I509 Heart failure, unspecified: Secondary | ICD-10-CM | POA: Diagnosis not present

## 2017-10-23 DIAGNOSIS — D631 Anemia in chronic kidney disease: Secondary | ICD-10-CM | POA: Diagnosis not present

## 2017-10-23 DIAGNOSIS — I1310 Hypertensive heart and chronic kidney disease without heart failure, with stage 1 through stage 4 chronic kidney disease, or unspecified chronic kidney disease: Secondary | ICD-10-CM | POA: Diagnosis not present

## 2017-10-23 NOTE — Progress Notes (Signed)
Patient ID: Michael [name], male   DOB: Aug 16, 1961, 56 y.o.   MRN: [id number]  Reason for Consult: Routine Post Op   Referred by [name], MD  Subjective:     HPI:  Michael [name] is a 56 y.o. male on dialysis via tunneled catheter has a left arm for stage basilic vein fistula.  His left arm is not painful his left hand is working well.  He does have some residual hematoma at the left antecubital fossa.  He was recently admitted with congestive heart failure exacerbation that is why he is not followed up recently.  States that he is on dialysis Monday Wednesday Friday at this time.  He has no acute complaints at the time of this visit.  He does take Coumadin.  Past Medical History:  Diagnosis Date  . Anemia of chronic disease   . Asthma    said he does not know  . Chronic kidney disease (CKD), stage IV (severe) ([place])   . Chronic systolic CHF (congestive heart failure) (HCC)    a. prior EF normal; b. Echo 10/16: Mild LVH, EF 30-35%, mitral valve bioprosthesis present without evidence of stenosis or regurgitation, severe LAE, mild RVE with mild to moderately reduced RVSF, moderate RAE  . Coronary artery disease   . Diabetes mellitus type 2 in obese ([place])   . Diabetes mellitus without complication ([place])   . Dyspnea   . GERD (gastroesophageal reflux disease)   . H/O tricuspid valve repair 2012  . History of echocardiogram    Echo at [facility] in [facility] 4/12: mod LVH, EF 60-65%, mod LAE, tissue MVR ok, mod TR, mod pulmo HTN with RVSP 48 mmHg  . Hypertension   . Obstructive sleep apnea   . Paroxysmal atrial fibrillation (HCC)    s/p Maze procedure at time of MVR in 2011  . Pulmonary HTN ([place])   . 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 Heart attack Neg Hx    Past Surgical History:  Procedure Laterality Date  . AV FISTULA PLACEMENT Left 07/13/2017   Procedure: LEFT ARM ARTERIOVENOUS (AV) FISTULA CREATION;   Surgeon: Waynetta Sandy, MD;  Location: [place];  Service: Vascular;  Laterality: Left;  . CARDIAC SURGERY    . CARDIAC VALVE REPLACEMENT  2011  . CARDIOVERSION N/A 07/09/2017   Procedure: CARDIOVERSION;  Surgeon: Larey Dresser, MD;  Location: [facility] ENDOSCOPY;  Service: Cardiovascular;  Laterality: N/A;  . COLONOSCOPY N/A 07/16/2017   Procedure: COLONOSCOPY;  Surgeon: Jerene Bears, MD;  Location: [place];  Service: Gastroenterology;  Laterality: N/A;  . ESOPHAGOGASTRODUODENOSCOPY N/A 07/16/2017   Procedure: ESOPHAGOGASTRODUODENOSCOPY (EGD);  Surgeon: Jerene Bears, MD;  Location: [facility] ENDOSCOPY;  Service: Gastroenterology;  Laterality: N/A;  . INSERTION OF DIALYSIS CATHETER Right 07/13/2017   Procedure: INSERTION OF DIALYSIS CATHETER RIGHT INTERNAL JUGULAR;  Surgeon: Waynetta Sandy, MD;  Location: [place];  Service: Vascular;  Laterality: Right;  . RIGHT HEART CATH N/A 06/29/2017   Procedure: RIGHT HEART CATH;  Surgeon: Larey Dresser, MD;  Location: [place] CV LAB;  Service: Cardiovascular;  Laterality: N/A;  . RIGHT/LEFT HEART CATH AND CORONARY ANGIOGRAPHY N/A 08/19/2017   Procedure: RIGHT/LEFT HEART CATH AND CORONARY ANGIOGRAPHY;  Surgeon: Leonie Man, MD;  Location: [place] CV LAB;  Service: Cardiovascular;  Laterality: N/A;  . TEE WITHOUT CARDIOVERSION N/A 07/09/2017   Procedure: TRANSESOPHAGEAL ECHOCARDIOGRAM (TEE);  Surgeon: Larey Dresser,  MD;  Location: [place] ENDOSCOPY;  Service: Cardiovascular;  Laterality: N/A;    Short Social History:  Social History   Tobacco Use  . Smoking status: Former Smoker    Packs/day: 1.00    Types: Cigarettes  . Smokeless tobacco: Former Systems developer  . Tobacco comment: said he is not a regular smoker  Substance Use Topics  . Alcohol use: No    No Known Allergies  Current Outpatient Medications  Medication Sig Dispense Refill  . acetaminophen (TYLENOL) 325 MG tablet Take 650 mg by mouth every 6 (six) hours.     Marland Kitchen  acetaminophen-codeine (TYLENOL #3) 300-30 MG tablet Take 1 tablet by mouth every 12 (twelve) hours as needed for moderate pain. (Patient taking differently: Take 1 tablet by mouth 2 (two) times daily. ) 40 tablet 1  . allopurinol (ZYLOPRIM) 300 MG tablet Take 0.5 tablets (150 mg total) by mouth daily. 15 tablet 3  . atorvastatin (LIPITOR) 20 MG tablet Take 1 tablet (20 mg total) by mouth daily. 30 tablet 3  . calcium acetate (PHOSLO) 667 MG capsule Take 1 capsule (667 mg total) by mouth 3 (three) times daily with meals. (Patient taking differently: Take 667-2,001 mg by mouth See admin instructions. Pt takes 2,001mg  by mouth three times daily and 667mg  twice daily with snacks) 90 capsule 3  . colchicine 0.6 MG tablet Take 0.5 tablets (0.3 mg total) by mouth 2 (two) times a week. 30 tablet 0  . diclofenac sodium (VOLTAREN) 1 % GEL Apply 4 g topically 4 (four) times daily.    Marland Kitchen docusate sodium (COLACE) 100 MG capsule Take 1 capsule (100 mg total) by mouth 2 (two) times daily. 10 capsule 0  . folic acid (FOLVITE) 1 MG tablet TAKE 1 TABLET BY MOUTH DAILY. (Patient taking differently: TAKE 1MG  BY MOUTH ONCE DAILY) 30 tablet 2  . metoprolol succinate (TOPROL-XL) 25 MG 24 hr tablet Take 1 tablet (25 mg total) by mouth 2 (two) times daily. (Patient taking differently: Take 25 mg by mouth daily. ) 60 tablet 6  . midodrine (PROAMATINE) 5 MG tablet Take 2 tablets (10 mg total) by mouth 3 (three) times daily with meals. 90 tablet 2  . omeprazole (PRILOSEC) 20 MG capsule Take 1 capsule (20 mg total) by mouth daily. 30 capsule 5  . OXYGEN Inhale 2 L into the lungs daily.    Marland Kitchen tiZANidine (ZANAFLEX) 4 MG tablet Take 1 tablet (4 mg total) by mouth every 8 (eight) hours as needed for muscle spasms. 60 tablet 1  . warfarin (COUMADIN) 2.5 MG tablet Take 1 tablet (2.5 mg total) by mouth daily at 6 PM. Or as directed by coumadin clinic 30 tablet 1   No current facility-administered medications for this visit.     Review  of Systems  Constitutional:  Constitutional negative. HENT: HENT negative.  Eyes: Eyes negative.  Respiratory: Positive for shortness of breath.  Cardiovascular: Positive for chest pain and dyspnea with exertion.  GI: Gastrointestinal negative.  Skin: Skin negative.  Neurological: Neurological negative. Hematologic: Positive for bruises/bleeds easily.  Psychiatric: Psychiatric negative.        Objective:  Objective   Vitals:   10/23/17 0920  BP: 94/61  Pulse: (!) 115  Resp: 16  Temp: (!) 97 F (36.1 C)  TempSrc: Oral  SpO2: 98%  Weight: 152 lb (68.9 kg)  Height: 5\' 5"  (1.651 m)   Body mass index is 25.29 kg/m.  Physical Exam  Constitutional: He is oriented to person, place, and  time. He appears well-developed.  HENT:  Head: Normocephalic.  Eyes: Pupils are equal, round, and reactive to light.  Neck: Normal range of motion.  Cardiovascular: Normal rate.  Pulses:      Radial pulses are 2+ on the left side.  Pulmonary/Chest: Effort normal.  Abdominal: Soft.  Musculoskeletal: Normal range of motion. He exhibits no edema.  Strong thrill in left upper arm  Neurological: He is alert and oriented to person, place, and time.  Skin: Skin is warm and dry.  Psychiatric: He has a normal mood and affect. His behavior is normal. Judgment and thought content normal.    Data: I have independently interpreted his dialysis duplex which demonstrates flow volume 1526 mL/min.  Diameters greater than 0.6 cm throughout.     Assessment/Plan:     56 year old male here for evaluation left second stage basilic vein transposition fistula.  We will get this scheduled in the near future on a nondialysis day.  Hold Coumadin times 72 hours.     Waynetta Sandy MD [facility]

## 2017-10-23 NOTE — Patient Outreach (Signed)
[facility]) Care Management  10/23/17   Michael [name] 06-05-1961 [id number]  Attempted to reach patient without success. Left HIPAA compliant voicemail with RNCM contact information and invited patient to call back.  [place] R. Kainoa Swoboda, RN, BSN, [place] Management Coordinator [phone]

## 2017-10-24 DIAGNOSIS — D631 Anemia in chronic kidney disease: Secondary | ICD-10-CM | POA: Diagnosis not present

## 2017-10-24 DIAGNOSIS — N186 End stage renal disease: Secondary | ICD-10-CM | POA: Diagnosis not present

## 2017-10-24 DIAGNOSIS — N2581 Secondary hyperparathyroidism of renal origin: Secondary | ICD-10-CM | POA: Diagnosis not present

## 2017-10-24 DIAGNOSIS — E876 Hypokalemia: Secondary | ICD-10-CM | POA: Diagnosis not present

## 2017-10-24 DIAGNOSIS — I509 Heart failure, unspecified: Secondary | ICD-10-CM | POA: Diagnosis not present

## 2017-10-24 DIAGNOSIS — I1310 Hypertensive heart and chronic kidney disease without heart failure, with stage 1 through stage 4 chronic kidney disease, or unspecified chronic kidney disease: Secondary | ICD-10-CM | POA: Diagnosis not present

## 2017-10-27 ENCOUNTER — Other Ambulatory Visit: Payer: Self-pay | Admitting: Pharmacist

## 2017-10-27 NOTE — Patient Outreach (Signed)
[facility]) Care Management  10/27/2017  Michael [name] [birth year] [id number]  Successful phone outreach to patient to reschedule today's home visit due to weather conditions.    Plan:  Home visit re-scheduled for next week.   [name], PharmD, [place] [phone]

## 2017-10-28 DIAGNOSIS — N2581 Secondary hyperparathyroidism of renal origin: Secondary | ICD-10-CM | POA: Diagnosis not present

## 2017-10-28 DIAGNOSIS — D631 Anemia in chronic kidney disease: Secondary | ICD-10-CM | POA: Diagnosis not present

## 2017-10-28 DIAGNOSIS — I509 Heart failure, unspecified: Secondary | ICD-10-CM | POA: Diagnosis not present

## 2017-10-28 DIAGNOSIS — E876 Hypokalemia: Secondary | ICD-10-CM | POA: Diagnosis not present

## 2017-10-28 DIAGNOSIS — I1310 Hypertensive heart and chronic kidney disease without heart failure, with stage 1 through stage 4 chronic kidney disease, or unspecified chronic kidney disease: Secondary | ICD-10-CM | POA: Diagnosis not present

## 2017-10-28 DIAGNOSIS — N186 End stage renal disease: Secondary | ICD-10-CM | POA: Diagnosis not present

## 2017-10-29 ENCOUNTER — Telehealth: Payer: Self-pay | Admitting: Pharmacist

## 2017-10-29 ENCOUNTER — Other Ambulatory Visit: Payer: Self-pay | Admitting: *Deleted

## 2017-10-29 ENCOUNTER — Ambulatory Visit (INDEPENDENT_AMBULATORY_CARE_PROVIDER_SITE_OTHER): Payer: Medicare Other | Admitting: *Deleted

## 2017-10-29 DIAGNOSIS — I079 Rheumatic tricuspid valve disease, unspecified: Secondary | ICD-10-CM

## 2017-10-29 DIAGNOSIS — Z5181 Encounter for therapeutic drug level monitoring: Secondary | ICD-10-CM | POA: Diagnosis not present

## 2017-10-29 DIAGNOSIS — I059 Rheumatic mitral valve disease, unspecified: Secondary | ICD-10-CM

## 2017-10-29 LAB — POCT INR: INR: 1.6

## 2017-10-29 NOTE — Patient Instructions (Addendum)
10/29/17-Take 1.5 tablets of Coumadin   10/30/17-Take 1 tablet of Coumadin  10/31/17-Hold Coumadin  11/01/17-Hold Coumadin  11/02/17-Hold Coumadin   11/03/17-Procedure Day, Resume Coumadin after procedure & when MD states ok to do so. Take extra 1/2 tablet of Coumadin along with normal dose of Coumadin.   11/04/17-Take extra 1/2 tablet of Coumadin along with normal dose of Coumadin.   11/05/17-Take normal dose of Coumadin   11/06/17-Take normal dose of Coumadin   11/07/17-Take normal dose of Coumadin   11/08/17-Take normal dose of Coumadin   11/09/17-Take normal dose of Coumadin   11/10/17-Take normal dose of Coumadin   11/11/17-Coumadin Clinic Appt

## 2017-10-29 NOTE — Telephone Encounter (Signed)
Pt is scheduled for a left 2nd stage basilic vein transposition on 12/18 with request to hold Coumadin for 3 days prior. Pt takes Coumadin for afib with CHADS2VASc score of 4 (HTN, DM, CAD, HF). Ok to hold Coumadin for 3 days prior without Lovenox bridge. Clearance faxed to Dr Servando Snare with [facility] at [phone].

## 2017-10-30 DIAGNOSIS — E876 Hypokalemia: Secondary | ICD-10-CM | POA: Diagnosis not present

## 2017-10-30 DIAGNOSIS — I1310 Hypertensive heart and chronic kidney disease without heart failure, with stage 1 through stage 4 chronic kidney disease, or unspecified chronic kidney disease: Secondary | ICD-10-CM | POA: Diagnosis not present

## 2017-10-30 DIAGNOSIS — I509 Heart failure, unspecified: Secondary | ICD-10-CM | POA: Diagnosis not present

## 2017-10-30 DIAGNOSIS — N2581 Secondary hyperparathyroidism of renal origin: Secondary | ICD-10-CM | POA: Diagnosis not present

## 2017-10-30 DIAGNOSIS — D631 Anemia in chronic kidney disease: Secondary | ICD-10-CM | POA: Diagnosis not present

## 2017-10-30 DIAGNOSIS — N186 End stage renal disease: Secondary | ICD-10-CM | POA: Diagnosis not present

## 2017-10-31 DIAGNOSIS — I509 Heart failure, unspecified: Secondary | ICD-10-CM | POA: Diagnosis not present

## 2017-10-31 DIAGNOSIS — D631 Anemia in chronic kidney disease: Secondary | ICD-10-CM | POA: Diagnosis not present

## 2017-10-31 DIAGNOSIS — N2581 Secondary hyperparathyroidism of renal origin: Secondary | ICD-10-CM | POA: Diagnosis not present

## 2017-10-31 DIAGNOSIS — N186 End stage renal disease: Secondary | ICD-10-CM | POA: Diagnosis not present

## 2017-10-31 DIAGNOSIS — E876 Hypokalemia: Secondary | ICD-10-CM | POA: Diagnosis not present

## 2017-10-31 DIAGNOSIS — I1310 Hypertensive heart and chronic kidney disease without heart failure, with stage 1 through stage 4 chronic kidney disease, or unspecified chronic kidney disease: Secondary | ICD-10-CM | POA: Diagnosis not present

## 2017-11-02 ENCOUNTER — Other Ambulatory Visit: Payer: Self-pay

## 2017-11-02 ENCOUNTER — Encounter (HOSPITAL_COMMUNITY): Payer: Self-pay | Admitting: *Deleted

## 2017-11-02 DIAGNOSIS — N186 End stage renal disease: Secondary | ICD-10-CM | POA: Diagnosis not present

## 2017-11-02 DIAGNOSIS — I509 Heart failure, unspecified: Secondary | ICD-10-CM | POA: Diagnosis not present

## 2017-11-02 DIAGNOSIS — E876 Hypokalemia: Secondary | ICD-10-CM | POA: Diagnosis not present

## 2017-11-02 DIAGNOSIS — I1310 Hypertensive heart and chronic kidney disease without heart failure, with stage 1 through stage 4 chronic kidney disease, or unspecified chronic kidney disease: Secondary | ICD-10-CM | POA: Diagnosis not present

## 2017-11-02 DIAGNOSIS — N2581 Secondary hyperparathyroidism of renal origin: Secondary | ICD-10-CM | POA: Diagnosis not present

## 2017-11-02 DIAGNOSIS — I482 Chronic atrial fibrillation: Secondary | ICD-10-CM | POA: Diagnosis not present

## 2017-11-02 DIAGNOSIS — D631 Anemia in chronic kidney disease: Secondary | ICD-10-CM | POA: Diagnosis not present

## 2017-11-02 NOTE — Progress Notes (Signed)
Pt SDW-pre-op call completed by pt and [place], [place] # [phone] .Pt denies SOB and chest pain. Pt not sure of name of cardiologist. Pt stated that he no longer takes medication for diabetes. Pt made aware to stop taking otc vitamins, fish oil and herbal medications. Do not take any NSAIDs ie: Voltaren gel. Pt stated that his last dose of Coumadin was " 2 days ago."  Pt made aware to check BG every 2 hours prior to arrival to hospital on DOS. Pt made aware to treat a BG < 70 with  4 ounces of apple or cranberry juice, wait 15 minutes after intervention to recheck BG, if BG remains < 70, call Short Stay unit to speak with a nurse. Pt verbalized understanding of all pre-op instructions. Anesthesia asked to review pt history.

## 2017-11-02 NOTE — Progress Notes (Addendum)
Anesthesia Chart Review:  Pt is a same day work up.   Pt speaks [place] and requires interpreter.   Pt is a 56 year old male scheduled for second stage basilic vein transposition on 11/03/2017 with Servando Snare, MD  - PCP is [name], MD. Last office visit 10/13/17 - Cardiologist is [name], MD.  Last cardiology eval 08/19/17 when pt underwent cath.    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 Uses O2 prn. Former smoker.   - Hospitalized 11/2-4/18 for R sided HF, pulmonary HTN, tricuspid regurgitation causing volume overload. "With the severity of his right-sided heart failure dialysis and management of his volume status is becoming increasingly difficult.  Palliative discussions held at last admission indicated the patient would like to continue with dialysis until no longer feasible.  If he continues to have recurrent difficulty as an outpatient these palliative focus discussions should be revisited.  As he was tolerating dialysis sessions and cardiology was unable to offer further interventions when consulted last admission, no changes to his medication regimen were made."  - Hospitalized 10/17-21/18 for R sided HF, pulmonary HTN, tricuspid regurgitation causing volume overload.  - Hospitalized 9/24-10/10/18 for syncope and collapse, volume overload. During dialysis, pt became unresponsive, and staff told EMS pt was pulseless and apneic.  CPR performed with ROSC after "a few minutes". AED did not advise shock.    - Hospitalized 8/9-9/10/18 for R heart failure, acute on chronic CKD that progressed to ESRD (dialysis initiated). Complicated by chronic atrial fibrillation (TEE guided DCCV 07/09/17 but quickly reverted back to afib; discharge summary notes  "Would not repeat DCCV")    Medications not yet reviewed with pt due to language barrier. Current list (may not be accurate): includes: lipitor, folic acid, metoprolol, midodrine, prilosec, coumadin.  Pt to stop coumadin 3 days prior to surgery.   Labs will be obtained day of surgery. To include PT/INR and CBC (due to thrombocytopenia)  1 view CXR 09/18/17:  - Stable cardiomegaly.  - Small to moderate bilateral pleural effusions.  - Interstitial pulmonary edema.  EKG 09/18/17:  - Atrial fibrillation with RVR (110 bpm).  Right superior axis deviation. Incomplete RBBB. Possible RVH. Cannot rule out Anterior infarct, age undetermined.   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 Not a PCI option & likely not acute.  Otherwise minimal CAD elsewhere.  Hemodynamic findings consistent with at least moderate pulmonary hypertension with mildly elevated LV pressures  Echo 08/13/17:  - Left ventricle: The cavity size was normal. Wall thickness was increased in a pattern of mild LVH. Indeterminant diastolic function (atrial fibrillation). Systolic function was low normal to mildly reduced. The estimated ejection fraction was in the range of 50% to 55%. Although no diagnostic regional wall motion abnormality was identified, this possibility cannot be completely excluded on the basis of this study. - Ventricular septum: D-shaped interventricular septum suggests RV pressure/volume overload. - Aortic valve: There was no stenosis. There was mild regurgitation. - Aorta: Mildly dilated ascending aorta. Ascending aortic diameter: 39 mm (S). - Mitral valve: There is a bioprosthetic mitral valve. Mildly elevated mean gradient across the bioprosthetic mitral valve, doubt significant stenosis. There was no significant regurgitation. Mean gradient (D): 8 mm  Hg. - Left atrium: The atrium was severely dilated. -  Right ventricle: The cavity size was severely dilated. Systolic function was moderately reduced. - Right atrium: The atrium was moderately dilated. - Tricuspid valve: S/p tricuspid valve repair. The valve does not appear to fully coapt. There was severe regurgitation. Peak RV-RA gradient (S): 48 mm Hg. - Pulmonary arteries: PA peak pressure: 63 mm Hg (S). - Systemic veins: IVC measured 3.3 cm with < 50% respirophasic variation, suggesting RA pressure 15 mmHg. - Pericardium, extracardiac: A trivial pericardial effusion was identified.  - Impressions: - The patient was in atrial fibrillation. Normal LV size with mild LV hypertrophy. EF 50-55%, low normal to mildly reduced. D-shaped interventricular septum suggestive of RV pressure/volume overload. Severely dilated RV with moderately decreased systolic function. Bioprosthetic mitral valve with mildly elevated mean gradient, no regurgitation noted. Mild aortic insufficiency. S/p TV repair but there is severe TR. Moderate pulmonary hypertension.   Right heart cath 06/29/17:  RA mean 27 RV 63/8, mean 20 PA 58/28, mean 40 PCWP mean 28  Oxygen saturations: PA 69% AO 96%  Cardiac Output (Fick) 9.82  Cardiac Index (Fick) 4.96 PVR 1.2 WU  Pt will need further assessment day of surgery by assigned anesthesiologist day of surgery.  If no acute CV symptoms, and if labs (including CBC and PT/INR) acceptable, I anticipate pt can proceed as scheduled.   Willeen Cass, FNP-BC [name] Short Stay Surgical [facility]/Anesthesiology Phone: [phone] 11/02/2017 1:08 PM

## 2017-11-03 ENCOUNTER — Ambulatory Visit (HOSPITAL_COMMUNITY): Payer: Medicare Other | Admitting: Emergency Medicine

## 2017-11-03 ENCOUNTER — Other Ambulatory Visit: Payer: Self-pay | Admitting: Pharmacist

## 2017-11-03 ENCOUNTER — Encounter (HOSPITAL_COMMUNITY)
Admission: RE | Disposition: A | Discharge status: Home / Self Care | Payer: Self-pay | Source: Ambulatory Visit | Attending: Vascular Surgery

## 2017-11-03 ENCOUNTER — Ambulatory Visit (HOSPITAL_COMMUNITY)
Admission: RE | Admit: 2017-11-03 | Discharge: 2017-11-03 | Disposition: A | Discharge status: Home / Self Care | Payer: Medicare Other | Source: Ambulatory Visit | Attending: Vascular Surgery | Admitting: Vascular Surgery

## 2017-11-03 ENCOUNTER — Telehealth: Payer: Self-pay | Admitting: Vascular Surgery

## 2017-11-03 ENCOUNTER — Encounter (HOSPITAL_COMMUNITY): Payer: Self-pay

## 2017-11-03 DIAGNOSIS — M545 Low back pain, unspecified: Secondary | ICD-10-CM

## 2017-11-03 DIAGNOSIS — I2510 Atherosclerotic heart disease of native coronary artery without angina pectoris: Secondary | ICD-10-CM | POA: Diagnosis not present

## 2017-11-03 DIAGNOSIS — I132 Hypertensive heart and chronic kidney disease with heart failure and with stage 5 chronic kidney disease, or end stage renal disease: Secondary | ICD-10-CM | POA: Diagnosis not present

## 2017-11-03 DIAGNOSIS — D696 Thrombocytopenia, unspecified: Secondary | ICD-10-CM | POA: Insufficient documentation

## 2017-11-03 DIAGNOSIS — N184 Chronic kidney disease, stage 4 (severe): Secondary | ICD-10-CM | POA: Insufficient documentation

## 2017-11-03 DIAGNOSIS — Z87891 Personal history of nicotine dependence: Secondary | ICD-10-CM | POA: Diagnosis not present

## 2017-11-03 DIAGNOSIS — J45909 Unspecified asthma, uncomplicated: Secondary | ICD-10-CM | POA: Diagnosis not present

## 2017-11-03 DIAGNOSIS — E1122 Type 2 diabetes mellitus with diabetic chronic kidney disease: Secondary | ICD-10-CM | POA: Diagnosis not present

## 2017-11-03 DIAGNOSIS — K219 Gastro-esophageal reflux disease without esophagitis: Secondary | ICD-10-CM | POA: Insufficient documentation

## 2017-11-03 DIAGNOSIS — R0600 Dyspnea, unspecified: Secondary | ICD-10-CM | POA: Insufficient documentation

## 2017-11-03 DIAGNOSIS — I272 Pulmonary hypertension, unspecified: Secondary | ICD-10-CM | POA: Insufficient documentation

## 2017-11-03 DIAGNOSIS — Z79899 Other long term (current) drug therapy: Secondary | ICD-10-CM | POA: Diagnosis not present

## 2017-11-03 DIAGNOSIS — G4733 Obstructive sleep apnea (adult) (pediatric): Secondary | ICD-10-CM | POA: Insufficient documentation

## 2017-11-03 DIAGNOSIS — I480 Paroxysmal atrial fibrillation: Secondary | ICD-10-CM | POA: Insufficient documentation

## 2017-11-03 DIAGNOSIS — Z7901 Long term (current) use of anticoagulants: Secondary | ICD-10-CM | POA: Diagnosis not present

## 2017-11-03 DIAGNOSIS — D631 Anemia in chronic kidney disease: Secondary | ICD-10-CM | POA: Insufficient documentation

## 2017-11-03 DIAGNOSIS — I082 Rheumatic disorders of both aortic and tricuspid valves: Secondary | ICD-10-CM | POA: Insufficient documentation

## 2017-11-03 DIAGNOSIS — Z992 Dependence on renal dialysis: Secondary | ICD-10-CM | POA: Insufficient documentation

## 2017-11-03 DIAGNOSIS — Z952 Presence of prosthetic heart valve: Secondary | ICD-10-CM | POA: Diagnosis not present

## 2017-11-03 DIAGNOSIS — I509 Heart failure, unspecified: Secondary | ICD-10-CM | POA: Diagnosis not present

## 2017-11-03 DIAGNOSIS — N186 End stage renal disease: Secondary | ICD-10-CM | POA: Diagnosis not present

## 2017-11-03 DIAGNOSIS — I5022 Chronic systolic (congestive) heart failure: Secondary | ICD-10-CM | POA: Diagnosis not present

## 2017-11-03 DIAGNOSIS — I130 Hypertensive heart and chronic kidney disease with heart failure and stage 1 through stage 4 chronic kidney disease, or unspecified chronic kidney disease: Secondary | ICD-10-CM | POA: Insufficient documentation

## 2017-11-03 DIAGNOSIS — G8929 Other chronic pain: Secondary | ICD-10-CM

## 2017-11-03 HISTORY — DX: Thrombocytopenia, unspecified: D69.6

## 2017-11-03 HISTORY — PX: BASCILIC VEIN TRANSPOSITION: SHX5742

## 2017-11-03 HISTORY — DX: Unspecified cirrhosis of liver: K74.60

## 2017-11-03 LAB — GLUCOSE, CAPILLARY
Glucose-Capillary: 69 mg/dL (ref 65–99)
Glucose-Capillary: 93 mg/dL (ref 65–99)
Glucose-Capillary: 70 mg/dL (ref 65–99)

## 2017-11-03 LAB — CBC
HCT: 34.3 % — ABNORMAL LOW (ref 39.0–52.0)
Hemoglobin: 11.3 g/dL — ABNORMAL LOW (ref 13.0–17.0)
MCH: 25.6 pg — ABNORMAL LOW (ref 26.0–34.0)
MCHC: 32.9 g/dL (ref 30.0–36.0)
MCV: 77.8 fL — AB (ref 78.0–100.0)
PLATELETS: 76 10*3/uL — AB (ref 150–400)
RBC: 4.41 MIL/uL (ref 4.22–5.81)
RDW: 19.6 % — ABNORMAL HIGH (ref 11.5–15.5)
WBC: 3.8 10*3/uL — AB (ref 4.0–10.5)

## 2017-11-03 LAB — POCT I-STAT 4, (NA,K, GLUC, HGB,HCT)
Glucose, Bld: 80 mg/dL (ref 65–99)
HCT: 37 % — ABNORMAL LOW (ref 39.0–52.0)
Hemoglobin: 12.6 g/dL — ABNORMAL LOW (ref 13.0–17.0)
Potassium: 3.9 mmol/L (ref 3.5–5.1)
SODIUM: 142 mmol/L (ref 135–145)

## 2017-11-03 LAB — PROTIME-INR
INR: 1.52
PROTHROMBIN TIME: 18.2 s — AB (ref 11.4–15.2)

## 2017-11-03 SURGERY — TRANSPOSITION, VEIN, BASILIC
Anesthesia: General | Site: Arm Upper | Laterality: Left

## 2017-11-03 MED ORDER — ACETAMINOPHEN-CODEINE #3 300-30 MG PO TABS: 1.0000 | ORAL_TABLET | Freq: Four times a day (QID) | ORAL | 0 refills | Status: DC | PRN

## 2017-11-03 MED ORDER — FENTANYL CITRATE (PF) 100 MCG/2ML IJ SOLN
INTRAMUSCULAR | Status: DC | PRN
  Administered 2017-11-03 (×2): 25 ug via INTRAVENOUS

## 2017-11-03 MED ORDER — 0.9 % SODIUM CHLORIDE (POUR BTL) OPTIME
TOPICAL | Status: DC | PRN
  Administered 2017-11-03: 1000 mL

## 2017-11-03 MED ORDER — LIDOCAINE HCL (CARDIAC) 20 MG/ML IV SOLN
INTRAVENOUS | Status: DC | PRN
  Administered 2017-11-03: 30 mg via INTRAVENOUS

## 2017-11-03 MED ORDER — PHENYLEPHRINE HCL 10 MG/ML IJ SOLN
INTRAMUSCULAR | Status: DC | PRN
  Administered 2017-11-03: 75 ug/min via INTRAVENOUS

## 2017-11-03 MED ORDER — HEMOSTATIC AGENTS (NO CHARGE) OPTIME
TOPICAL | Status: DC | PRN
  Administered 2017-11-03: 1 via TOPICAL

## 2017-11-03 MED ORDER — DEXTROSE 50 % IV SOLN
25.0000 mL | Freq: Once | INTRAVENOUS | Status: AC
  Administered 2017-11-03: 25 mL via INTRAVENOUS
  Filled 2017-11-03: qty 50

## 2017-11-03 MED ORDER — ONDANSETRON HCL 4 MG/2ML IJ SOLN
INTRAMUSCULAR | Status: DC | PRN
  Administered 2017-11-03: 4 mg via INTRAVENOUS

## 2017-11-03 MED ORDER — DEXTROSE 5 % IV SOLN
1.5000 g | INTRAVENOUS | Status: AC
  Administered 2017-11-03: 1.5 g via INTRAVENOUS
  Filled 2017-11-03: qty 1.5

## 2017-11-03 MED ORDER — SODIUM CHLORIDE 0.9 % IV SOLN
INTRAVENOUS | Status: DC
  Administered 2017-11-03 (×2): via INTRAVENOUS

## 2017-11-03 MED ORDER — FENTANYL CITRATE (PF) 100 MCG/2ML IJ SOLN
25.0000 ug | INTRAMUSCULAR | Status: DC | PRN
  Administered 2017-11-03: 25 ug via INTRAVENOUS

## 2017-11-03 MED ORDER — FENTANYL CITRATE (PF) 100 MCG/2ML IJ SOLN
INTRAMUSCULAR | Status: AC
  Filled 2017-11-03: qty 2

## 2017-11-03 MED ORDER — SODIUM CHLORIDE 0.9 % IV SOLN
INTRAVENOUS | Status: DC | PRN
  Administered 2017-11-03: 10:00:00

## 2017-11-03 MED ORDER — PROPOFOL 10 MG/ML IV BOLUS
INTRAVENOUS | Status: AC
  Filled 2017-11-03: qty 20

## 2017-11-03 MED ORDER — PROPOFOL 10 MG/ML IV BOLUS
INTRAVENOUS | Status: DC | PRN
  Administered 2017-11-03: 100 mg via INTRAVENOUS

## 2017-11-03 MED ORDER — ALBUMIN HUMAN 5 % IV SOLN
INTRAVENOUS | Status: DC | PRN
  Administered 2017-11-03: 10:00:00 via INTRAVENOUS

## 2017-11-03 MED ORDER — FENTANYL CITRATE (PF) 250 MCG/5ML IJ SOLN
INTRAMUSCULAR | Status: AC
  Filled 2017-11-03: qty 5

## 2017-11-03 MED ORDER — DEXTROSE 50 % IV SOLN
INTRAVENOUS | Status: AC
  Administered 2017-11-03: 25 mL via INTRAVENOUS
  Filled 2017-11-03: qty 50

## 2017-11-03 SURGICAL SUPPLY — 38 items
ADH SKN CLS APL DERMABOND .7 (GAUZE/BANDAGES/DRESSINGS) ×2
ARMBAND PINK RESTRICT EXTREMIT (MISCELLANEOUS) ×2 IMPLANT
CANISTER SUCT 3000ML PPV (MISCELLANEOUS) ×2 IMPLANT
CLIP VESOCCLUDE MED 24/CT (CLIP) ×1 IMPLANT
CLIP VESOCCLUDE MED 6/CT (CLIP) IMPLANT
CLIP VESOCCLUDE SM WIDE 24/CT (CLIP) ×1 IMPLANT
CLIP VESOCCLUDE SM WIDE 6/CT (CLIP) IMPLANT
COVER PROBE W GEL 5X96 (DRAPES) ×2 IMPLANT
DERMABOND ADVANCED (GAUZE/BANDAGES/DRESSINGS) ×2
DERMABOND ADVANCED .7 DNX12 (GAUZE/BANDAGES/DRESSINGS) ×1 IMPLANT
ELECT REM PT RETURN 9FT ADLT (ELECTROSURGICAL) ×2
ELECTRODE REM PT RTRN 9FT ADLT (ELECTROSURGICAL) ×1 IMPLANT
GLOVE BIO SURGEON STRL SZ 6.5 (GLOVE) ×1 IMPLANT
GLOVE BIO SURGEON STRL SZ7.5 (GLOVE) ×2 IMPLANT
GLOVE BIOGEL PI IND STRL 7.0 (GLOVE) IMPLANT
GLOVE BIOGEL PI IND STRL 7.5 (GLOVE) IMPLANT
GLOVE BIOGEL PI INDICATOR 7.0 (GLOVE) ×1
GLOVE BIOGEL PI INDICATOR 7.5 (GLOVE) ×1
GLOVE ECLIPSE 7.0 STRL STRAW (GLOVE) ×1 IMPLANT
GOWN STRL REUS W/ TWL LRG LVL3 (GOWN DISPOSABLE) ×2 IMPLANT
GOWN STRL REUS W/ TWL XL LVL3 (GOWN DISPOSABLE) ×1 IMPLANT
GOWN STRL REUS W/TWL LRG LVL3 (GOWN DISPOSABLE) ×4
GOWN STRL REUS W/TWL XL LVL3 (GOWN DISPOSABLE) ×2
HEMOSTAT SNOW SURGICEL 2X4 (HEMOSTASIS) ×1 IMPLANT
KIT BASIN OR (CUSTOM PROCEDURE TRAY) ×2 IMPLANT
KIT ROOM TURNOVER OR (KITS) ×2 IMPLANT
NS IRRIG 1000ML POUR BTL (IV SOLUTION) ×2 IMPLANT
PACK CV ACCESS (CUSTOM PROCEDURE TRAY) ×2 IMPLANT
PAD ARMBOARD 7.5X6 YLW CONV (MISCELLANEOUS) ×4 IMPLANT
SUT MNCRL AB 4-0 PS2 18 (SUTURE) ×4 IMPLANT
SUT PROLENE 5 0 C 1 24 (SUTURE) ×1 IMPLANT
SUT PROLENE 6 0 BV (SUTURE) ×4 IMPLANT
SUT SILK 2 0 SH (SUTURE) ×1 IMPLANT
SUT VIC AB 3-0 SH 27 (SUTURE) ×6
SUT VIC AB 3-0 SH 27X BRD (SUTURE) ×1 IMPLANT
TOWEL GREEN STERILE (TOWEL DISPOSABLE) ×2 IMPLANT
UNDERPAD 30X30 (UNDERPADS AND DIAPERS) ×2 IMPLANT
WATER STERILE IRR 1000ML POUR (IV SOLUTION) ×2 IMPLANT

## 2017-11-03 NOTE — Op Note (Signed)
    Patient name: Michael [name] MRN: [id number] DOB: [birth year] Sex: male  11/03/2017 Pre-operative Diagnosis: esrd Post-operative diagnosis:  Same Surgeon:  Erlene Quan C. Donzetta Matters, MD Assistant: Gerri Lins, PA Procedure Performed:  Left second stage basilic vein transposition av fistula  Indications:  56 year old male with end-stage renal disease on dialysis the right IJ catheter. He had a left first takes a pain transposition fistula now indicated for the second stage.  Findings: the vein was actually quite thin-walled despite being several months old. Following transposition there was palpable until he within the vein strong signal by Doppler and a palpable radial pulse the wrist confirmed with Doppler.   Procedure:  The patient was identified in the holding area and taken to the operating room where he was placed supine on the operating table and LMA anesthesia induced. He was sterilely prepped and draped in the left upper extremity usual fashion given antibiotics and a timeout called. We began with longitudinal incision just above the antecubital dissected down onto the vein. Unfortunately there was injury to the vein that was repaired with 6-0 Prolene suture. This terminated that the vein was very thin-walled. We then traced the vein protecting the cutaneous nerves along the way through 2 separate incisions up to the level of axilla. Branches were divided between clips and ties. The vein was completely free we marked for orientation nearly antecubital clamped proximally and also in the axilla and transected. Was then flushed with a saline there were noted to be no areas for repair. He is again marked for orientation and tunneled subcutaneously. Following this we again flushed with with heparinized saline in both directions. We then spatulated both ends so the underside with 2 6-0 Prolene sutures. Prior to completing we'll out flush maneuvers. Upon completion there was strong pulsatility  throughout the vein graft consistent with the preoperative exam. We did the plantar repair stitch in the anastomosis. We then attempted to obtain hemostasis but he was quite easy. We irrigated the wounds and closed in 2 layers with 3-0 Vicryl 4-0 Monocryl. Patient tolerated procedure well without immediate, complication. All counts were correct at completion.   EBL 200 mL.   Niah Heinle C. Donzetta Matters, MD [facility] [place] Office: [phone] Pager: [phone]

## 2017-11-03 NOTE — Transfer of Care (Signed)
Immediate Anesthesia Transfer of Care Note  Patient: Abdulraheem Mote  Procedure(s) Performed: BASILIC VEIN TRANSPOSITION SECOND STAGE (Left Arm Upper)  Patient Location: PACU  Anesthesia Type:General  Level of Consciousness: awake and alert   Airway & Oxygen Therapy: Patient Spontanous Breathing and Patient connected to nasal cannula oxygen  Post-op Assessment: Report given to RN and Post -op Vital signs reviewed and stable  Post vital signs: Reviewed and stable  Last Vitals:  Vitals:   11/03/17 0853 11/03/17 1208  BP: 105/64 102/72  Pulse: 64 69  Resp:  13  Temp:  36.6 C  SpO2:  95%    Last Pain:  Vitals:   11/03/17 0818  TempSrc:   PainSc: 0-No pain      Patients Stated Pain Goal: 3 (11/18/70 5366)  Complications: No apparent anesthesia complications

## 2017-11-03 NOTE — Anesthesia Preprocedure Evaluation (Addendum)
Anesthesia Evaluation  Patient identified by MRN, date of birth, ID band Patient awake    Reviewed: Allergy & Precautions, NPO status , Patient's Chart, lab work & pertinent test results, reviewed documented beta blocker date and time   Airway Mallampati: II  TM Distance: >3 FB Neck ROM: Full    Dental  (+) Dental Advisory Given   Pulmonary shortness of breath, asthma , sleep apnea , former smoker,    breath sounds clear to auscultation       Cardiovascular hypertension, Pt. on medications and Pt. on home beta blockers + CAD and +CHF  + Valvular Problems/Murmurs (s/p mitral valve replacement . Most recent echo with mod AI, mod-severe TR with right sided depressed function.)  Rhythm:Regular Rate:Normal     Neuro/Psych negative neurological ROS     GI/Hepatic Neg liver ROS, GERD  ,  Endo/Other  diabetes, Type 2  Renal/GU CRFRenal disease     Musculoskeletal   Abdominal   Peds  Hematology  (+) anemia , thrombocytopenia   Anesthesia Other Findings   Reproductive/Obstetrics                           Lab Results  Component Value Date   WBC 4.3 09/20/2017   HGB 12.6 (L) 11/03/2017   HCT 37.0 (L) 11/03/2017   MCV 79.5 09/20/2017   PLT 55 (L) 09/20/2017   Lab Results  Component Value Date   CREATININE 2.49 (H) 09/20/2017   BUN 7 09/20/2017   NA 142 11/03/2017   K 3.9 11/03/2017   CL 101 09/20/2017   CO2 26 09/20/2017   Study Conclusions  - Left ventricle: The cavity size was normal. Wall thickness was  normal. Systolic function was normal. The estimated ejection  fraction was in the range of 55% to 60%. Wall motion was normal;  there were no regional wall motion abnormalities. No evidence of   thrombus. - Aortic valve: There was no stenosis. There was moderate  regurgitation. - Aorta: Normal caliber aorta with grade III plaque in descending  thoracic aorta. - Mitral valve: Status post  mitral valve replacement with  bioprosthetic valve. Trivial MR. Mean gradient 6 mmHg across   valve, does not appear to have significant stenosis. - Left atrium: The atrium was severely dilated. The LA appendage  appears to have been ligated. No LA thrombus. - Right ventricle: D-shaped interventricular septum suggestive of  RV pressure/volume overload. The RV was severely dilated with  moderately decreased systolic function. - Right atrium: The atrium was moderately dilated. - Atrial septum: No defect or patent foramen ovale was identified. - Tricuspid valve: S/p tricuspid valve repair with moderate to  severe TR. Peak RV-RA gradient 25 mmHg.  Anesthesia Physical Anesthesia Plan  ASA: IV  Anesthesia Plan: General   Post-op Pain Management:    Induction: Intravenous  PONV Risk Score and Plan: 2 and Ondansetron, Dexamethasone and Treatment may vary due to age or medical condition  Airway Management Planned: LMA  Additional Equipment:   Intra-op Plan:   Post-operative Plan: Extubation in OR  Informed Consent: I have reviewed the patients History and Physical, chart, labs and discussed the procedure including the risks, benefits and alternatives for the proposed anesthesia with the patient or authorized representative who has indicated his/her understanding and acceptance.   Dental advisory given  Plan Discussed with: CRNA  Anesthesia Plan Comments:       Anesthesia Quick Evaluation

## 2017-11-03 NOTE — Anesthesia Procedure Notes (Signed)
Procedure Name: LMA Insertion Date/Time: 11/03/2017 10:16 AM Performed by: Eligha Bridegroom, CRNA Pre-anesthesia Checklist: Patient identified, Emergency Drugs available, Suction available, Patient being monitored and Timeout performed Patient Re-evaluated:Patient Re-evaluated prior to induction Oxygen Delivery Method: Circle system utilized Preoxygenation: Pre-oxygenation with 100% oxygen Induction Type: IV induction Ventilation: Mask ventilation without difficulty LMA: LMA inserted LMA Size: 4.0 Number of attempts: 1 Placement Confirmation: breath sounds checked- equal and bilateral and positive ETCO2 Tube secured with: Tape Dental Injury: Teeth and Oropharynx as per pre-operative assessment

## 2017-11-03 NOTE — Telephone Encounter (Signed)
-----   Message from Mena Goes, RN sent at 11/03/2017 12:31 PM EST ----- Regarding: 6 weeks   ----- Message ----- From: Ulyses Amor, PA-C Sent: 11/03/2017  12:08 PM To: Vvs Charge Pool  S/P second stage av fistula needs duplex at follow up Dr. Donzetta Matters in 6 weeks

## 2017-11-03 NOTE — H&P (Signed)
   History and Physical Update  The patient was interviewed and re-examined.  The patient's previous History and Physical has been reviewed and is unchanged from previous. Plan left arm 2nd stage bvt.   Adaliah Hiegel C. Donzetta Matters, MD [facility] [place] Office: [phone] Pager: [phone]   11/03/2017, 8:07 AM

## 2017-11-03 NOTE — Telephone Encounter (Signed)
Sched apt 12/16/17; lab at 9:00 and MD at 10:15. Mailed appt letter.

## 2017-11-03 NOTE — Patient Outreach (Signed)
[place] [facility]) Care Management  11/03/2017  Dior Spackman 1961-08-17 [id number]  Initial [place] home visit was previously scheduled for today with patient.  Noted per chart review, patient admitted for surgical procedure.    Call was placed to patient's nephew, [name], on [facility] Consent Form, phone went directly to voicemail.  Call was placed to patient's phone number---HIPAA compliant message left requesting return call.   Plan:  Will await return call from patient to see if he would like to reschedule home visit.   [name], PharmD, [place] [phone]

## 2017-11-03 NOTE — Op Note (Deleted)
   History and Physical Update  The patient was interviewed and re-examined.  The patient's previous History and Physical has been reviewed and is unchanged from previous. Plan left arm 2nd stage bvt.   Diera Wirkkala C. Donzetta Matters, MD [facility] [place] Office: [phone] Pager: [phone]   11/03/2017, 8:07 AM

## 2017-11-04 ENCOUNTER — Other Ambulatory Visit: Payer: Self-pay

## 2017-11-04 ENCOUNTER — Encounter (HOSPITAL_COMMUNITY): Payer: Self-pay | Admitting: Vascular Surgery

## 2017-11-04 DIAGNOSIS — I1310 Hypertensive heart and chronic kidney disease without heart failure, with stage 1 through stage 4 chronic kidney disease, or unspecified chronic kidney disease: Secondary | ICD-10-CM | POA: Diagnosis not present

## 2017-11-04 DIAGNOSIS — I509 Heart failure, unspecified: Secondary | ICD-10-CM | POA: Diagnosis not present

## 2017-11-04 DIAGNOSIS — Z48812 Encounter for surgical aftercare following surgery on the circulatory system: Secondary | ICD-10-CM

## 2017-11-04 DIAGNOSIS — N184 Chronic kidney disease, stage 4 (severe): Secondary | ICD-10-CM

## 2017-11-04 DIAGNOSIS — E876 Hypokalemia: Secondary | ICD-10-CM | POA: Diagnosis not present

## 2017-11-04 DIAGNOSIS — N186 End stage renal disease: Secondary | ICD-10-CM | POA: Diagnosis not present

## 2017-11-04 DIAGNOSIS — N2581 Secondary hyperparathyroidism of renal origin: Secondary | ICD-10-CM | POA: Diagnosis not present

## 2017-11-04 DIAGNOSIS — N179 Acute kidney failure, unspecified: Secondary | ICD-10-CM

## 2017-11-04 DIAGNOSIS — Z992 Dependence on renal dialysis: Secondary | ICD-10-CM

## 2017-11-04 DIAGNOSIS — D631 Anemia in chronic kidney disease: Secondary | ICD-10-CM | POA: Diagnosis not present

## 2017-11-04 NOTE — Anesthesia Postprocedure Evaluation (Signed)
Anesthesia Post Note  Patient: Michael [name]  Procedure(s) Performed: BASILIC VEIN TRANSPOSITION SECOND STAGE (Left Arm Upper)     Patient location during evaluation: PACU Anesthesia Type: General Level of consciousness: awake and alert Pain management: pain level controlled Vital Signs Assessment: post-procedure vital signs reviewed and stable Respiratory status: spontaneous breathing, nonlabored ventilation, respiratory function stable and patient connected to nasal cannula oxygen Cardiovascular status: blood pressure returned to baseline and stable Postop Assessment: no apparent nausea or vomiting Anesthetic complications: no    Last Vitals:  Vitals:   11/03/17 1400 11/03/17 1430  BP: 116/67 132/90  Pulse: 74 96  Resp:    Temp:  (!) 36.2 C  SpO2: 95% 95%    Last Pain:  Vitals:   11/03/17 1400  TempSrc:   PainSc: Tyler Deis

## 2017-11-06 ENCOUNTER — Encounter: Payer: Self-pay | Admitting: *Deleted

## 2017-11-06 ENCOUNTER — Other Ambulatory Visit: Payer: Self-pay | Admitting: *Deleted

## 2017-11-06 DIAGNOSIS — N186 End stage renal disease: Secondary | ICD-10-CM | POA: Diagnosis not present

## 2017-11-06 DIAGNOSIS — I1310 Hypertensive heart and chronic kidney disease without heart failure, with stage 1 through stage 4 chronic kidney disease, or unspecified chronic kidney disease: Secondary | ICD-10-CM | POA: Diagnosis not present

## 2017-11-06 DIAGNOSIS — D631 Anemia in chronic kidney disease: Secondary | ICD-10-CM | POA: Diagnosis not present

## 2017-11-06 DIAGNOSIS — N2581 Secondary hyperparathyroidism of renal origin: Secondary | ICD-10-CM | POA: Diagnosis not present

## 2017-11-06 DIAGNOSIS — E876 Hypokalemia: Secondary | ICD-10-CM | POA: Diagnosis not present

## 2017-11-06 DIAGNOSIS — I509 Heart failure, unspecified: Secondary | ICD-10-CM | POA: Diagnosis not present

## 2017-11-06 NOTE — Patient Outreach (Signed)
[place] [facility]) Care Management [place] Telephone Outreach  11/06/2017  [place] Driscoll [birth year] [id number]  Initially unsuccessful telephone outreach to [facility]; left HIPAA compliant voicemail requesting call back; patient returned my call immediately; HIPAA/ identity verified during phone call today.  Patient was referred to [facility] previously for transition of care; call was placed today in follow up on previous [place] outreach attempts placed by [place], previous [facility] RN CM involved in patient's care.  Patient has history including, but not limited to CKD requiring hemodialysis M-W-F, CAD, HTN, A-Fib, CHF with cardiomyopathy, DM, and GERD.  Today, patient is able to verify his name and dob; unfortunately, there is a language barrier, as patient reports he is currently at HD; patient received assistance with today's phone call from "[place]," staff member at HD facility, and patient requested that I speak with [place] today while phone is on speaker mode.  Explained to patient that I was calling on behalf of his previously involved [place].  Through communication with "[place]," I obtained the following information: -- patient speaks "[place]" but states that he does not need an interpreter if I call his home on Tuesday's or Thursday's when he is not in HD; states there is someone at his home that is able to interpret English for him -- patient requests phone call next week at his home so I can speak with his (personal) interpreter.  Through [place], I explained to patient that I would re-attempt telephone outreach to him on Thursday of next week; patient verbalized understanding and agreement with this plan.  Plan:  Will re-attempt telephone outreach to patient next week on Thursday 11/12/17 to assess patient's current [facility] Community CM needs.  Oneta Rack, RN, BSN, [employer] [facility]  Care Management  [phone]

## 2017-11-07 DIAGNOSIS — N186 End stage renal disease: Secondary | ICD-10-CM | POA: Diagnosis not present

## 2017-11-07 DIAGNOSIS — E876 Hypokalemia: Secondary | ICD-10-CM | POA: Diagnosis not present

## 2017-11-07 DIAGNOSIS — N2581 Secondary hyperparathyroidism of renal origin: Secondary | ICD-10-CM | POA: Diagnosis not present

## 2017-11-07 DIAGNOSIS — D631 Anemia in chronic kidney disease: Secondary | ICD-10-CM | POA: Diagnosis not present

## 2017-11-07 DIAGNOSIS — I1310 Hypertensive heart and chronic kidney disease without heart failure, with stage 1 through stage 4 chronic kidney disease, or unspecified chronic kidney disease: Secondary | ICD-10-CM | POA: Diagnosis not present

## 2017-11-07 DIAGNOSIS — I509 Heart failure, unspecified: Secondary | ICD-10-CM | POA: Diagnosis not present

## 2017-11-09 DIAGNOSIS — I509 Heart failure, unspecified: Secondary | ICD-10-CM | POA: Diagnosis not present

## 2017-11-09 DIAGNOSIS — N2581 Secondary hyperparathyroidism of renal origin: Secondary | ICD-10-CM | POA: Diagnosis not present

## 2017-11-09 DIAGNOSIS — D631 Anemia in chronic kidney disease: Secondary | ICD-10-CM | POA: Diagnosis not present

## 2017-11-09 DIAGNOSIS — I1310 Hypertensive heart and chronic kidney disease without heart failure, with stage 1 through stage 4 chronic kidney disease, or unspecified chronic kidney disease: Secondary | ICD-10-CM | POA: Diagnosis not present

## 2017-11-09 DIAGNOSIS — E876 Hypokalemia: Secondary | ICD-10-CM | POA: Diagnosis not present

## 2017-11-09 DIAGNOSIS — N186 End stage renal disease: Secondary | ICD-10-CM | POA: Diagnosis not present

## 2017-11-09 DIAGNOSIS — I482 Chronic atrial fibrillation: Secondary | ICD-10-CM | POA: Diagnosis not present

## 2017-11-11 ENCOUNTER — Other Ambulatory Visit: Payer: Self-pay | Admitting: Family Medicine

## 2017-11-11 ENCOUNTER — Encounter: Payer: Self-pay | Admitting: Surgery

## 2017-11-11 ENCOUNTER — Ambulatory Visit (INDEPENDENT_AMBULATORY_CARE_PROVIDER_SITE_OTHER): Payer: Medicare Other | Admitting: Surgery

## 2017-11-11 ENCOUNTER — Ambulatory Visit (INDEPENDENT_AMBULATORY_CARE_PROVIDER_SITE_OTHER): Payer: Medicare Other

## 2017-11-11 VITALS — BP 101/67 | HR 77 | Temp 96.7°F | Resp 17 | Ht 65.0 in | Wt 154.4 lb

## 2017-11-11 DIAGNOSIS — I079 Rheumatic tricuspid valve disease, unspecified: Secondary | ICD-10-CM | POA: Diagnosis not present

## 2017-11-11 DIAGNOSIS — Z5181 Encounter for therapeutic drug level monitoring: Secondary | ICD-10-CM | POA: Diagnosis not present

## 2017-11-11 DIAGNOSIS — Z992 Dependence on renal dialysis: Secondary | ICD-10-CM

## 2017-11-11 DIAGNOSIS — I059 Rheumatic mitral valve disease, unspecified: Secondary | ICD-10-CM | POA: Diagnosis not present

## 2017-11-11 DIAGNOSIS — N186 End stage renal disease: Secondary | ICD-10-CM

## 2017-11-11 LAB — POCT INR: INR: 1.5

## 2017-11-11 MED ORDER — OXYCODONE HCL 5 MG PO TABS: 5.0000 mg | ORAL_TABLET | Freq: Three times a day (TID) | ORAL | 0 refills | Status: DC | PRN

## 2017-11-11 MED FILL — METOPROLOL SUCC ER 25 MG TA: 25 | 30 days supply | Qty: 60 | Fill #1

## 2017-11-11 MED FILL — COLCHICINE 0.6 MG TABS: 0.6 | 30 days supply | Qty: 30 | Fill #2

## 2017-11-11 MED FILL — MIDODRINE HCL 5 MG TABLET: 5 | 30 days supply | Qty: 90 | Fill #1 | Status: TO

## 2017-11-11 NOTE — Telephone Encounter (Signed)
Patient is requesting a refill

## 2017-11-11 NOTE — Patient Instructions (Addendum)
Take 2 tablets today, then start taking 1 tablet (2.5mg ) daily except 1.5 tablets (3.75mg ) on Wednesdays and Staurdays. Recheck INR in 10 days.

## 2017-11-11 NOTE — Progress Notes (Signed)
Patient name: Michael [name] MRN: [id number] DOB: 1961-11-01 Sex: male  REASON FOR VISIT:     post op  HISTORY OF PRESENT ILLNESS:   Michael [name] is a 56 y.o. male who is status post second stage left basilic vein fistula on 40/98/1191 by Dr. Donzetta Matters.  The vein was very thin-walled despite maturing for several months.  He is here today because of pain in his arm.  CURRENT MEDICATIONS:    Current Outpatient Medications  Medication Sig Dispense Refill  . acetaminophen (TYLENOL) 325 MG tablet Take 650 mg by mouth every 6 (six) hours.     Marland Kitchen acetaminophen-codeine (TYLENOL #3) 300-30 MG tablet Take 1 tablet by mouth every 6 (six) hours as needed for moderate pain. 12 tablet 0  . allopurinol (ZYLOPRIM) 300 MG tablet Take 0.5 tablets (150 mg total) by mouth daily. 15 tablet 3  . atorvastatin (LIPITOR) 20 MG tablet Take 1 tablet (20 mg total) by mouth daily. 30 tablet 3  . calcium acetate (PHOSLO) 667 MG capsule Take 1 capsule (667 mg total) by mouth 3 (three) times daily with meals. (Patient taking differently: Take 667-2,001 mg by mouth See admin instructions. Pt takes 2,001mg  by mouth three times daily and 667mg  twice daily with snacks) 90 capsule 3  . colchicine 0.6 MG tablet Take 0.5 tablets (0.3 mg total) by mouth 2 (two) times a week. 30 tablet 0  . diclofenac sodium (VOLTAREN) 1 % GEL Apply 4 g topically 4 (four) times daily as needed (pain).     Marland Kitchen docusate sodium (COLACE) 100 MG capsule Take 1 capsule (100 mg total) by mouth 2 (two) times daily. 10 capsule 0  . folic acid (FOLVITE) 1 MG tablet TAKE 1 TABLET BY MOUTH DAILY. (Patient taking differently: TAKE 1MG  BY MOUTH ONCE DAILY) 30 tablet 2  . metoprolol succinate (TOPROL-XL) 25 MG 24 hr tablet Take 1 tablet (25 mg total) by mouth 2 (two) times daily. (Patient taking differently: Take 25 mg by mouth daily. ) 60 tablet 6  . midodrine (PROAMATINE) 5 MG tablet Take 2 tablets (10 mg total) by mouth 3  (three) times daily with meals. 90 tablet 2  . omeprazole (PRILOSEC) 20 MG capsule Take 1 capsule (20 mg total) by mouth daily. 30 capsule 5  . OXYGEN Inhale 2 L into the lungs daily as needed.     Marland Kitchen tiZANidine (ZANAFLEX) 4 MG tablet Take 1 tablet (4 mg total) by mouth every 8 (eight) hours as needed for muscle spasms. 60 tablet 1  . warfarin (COUMADIN) 2.5 MG tablet Take 1 tablet (2.5 mg total) by mouth daily at 6 PM. Or as directed by coumadin clinic (Patient taking differently: Take 2.5-3.75 mg by mouth See admin instructions. Or as directed by coumadin clinic  3.75 mg (2.5 mg x 1.5) every Wed; 2.5 mg (2.5 mg x 1) all other days) 30 tablet 1  . oxyCODONE (OXY IR/ROXICODONE) 5 MG immediate release tablet Take 1 tablet (5 mg total) by mouth every 8 (eight) hours as needed for severe pain. 15 tablet 0   No current facility-administered medications for this visit.     REVIEW OF SYSTEMS:   [X]  denotes positive finding, [ ]  denotes negative finding Cardiac  Comments:  Chest pain or chest pressure:    Shortness of breath upon exertion:    Short of breath when lying flat:    Irregular heart rhythm:    Constitutional    Fever or chills:      PHYSICAL  EXAM:   Vitals:   11/11/17 1525  BP: 101/67  Pulse: 77  Resp: 17  Temp: (!) 96.7 F (35.9 C)  TempSrc: Oral  SpO2: 98%  Weight: 154 lb 6.4 oz (70 kg)  Height: 5\' 5"  (1.651 m)    GENERAL: The patient is a well-nourished male, in no acute distress. The vital signs are documented above. CARDIOVASCULAR: There is a regular rate and rhythm. PULMONARY: Non-labored respirations Ecchymosis throughout the upper arm which is tight, there is no evidence of any kind of compartment syndrome.  There is an excellent thrill within the fistula.  The patient has excellent grip strength and no pain in his hand.  His incisions are intact with overlying Dermabond.  STUDIES:   None   MEDICAL ISSUES:   Status post left second stage basilic vein  fistula.  The patient has a hematoma in his arm likely from his Coumadin.  There has been no compromise of his fistula.  I told him and his son that this should resolve with time.  I am giving him 15 oxycodone 5 mg tablets to help with his pain.  He will have a follow-up visit in a few weeks for a wound check.  Annamarie Major, MD [facility] [facility] [phone] Pager [phone]

## 2017-11-12 ENCOUNTER — Encounter: Payer: Self-pay | Admitting: *Deleted

## 2017-11-12 ENCOUNTER — Other Ambulatory Visit: Payer: Self-pay | Admitting: Pharmacist

## 2017-11-12 ENCOUNTER — Other Ambulatory Visit: Payer: Self-pay | Admitting: *Deleted

## 2017-11-12 DIAGNOSIS — D631 Anemia in chronic kidney disease: Secondary | ICD-10-CM | POA: Diagnosis not present

## 2017-11-12 DIAGNOSIS — I509 Heart failure, unspecified: Secondary | ICD-10-CM | POA: Diagnosis not present

## 2017-11-12 DIAGNOSIS — E876 Hypokalemia: Secondary | ICD-10-CM | POA: Diagnosis not present

## 2017-11-12 DIAGNOSIS — N186 End stage renal disease: Secondary | ICD-10-CM | POA: Diagnosis not present

## 2017-11-12 DIAGNOSIS — I1310 Hypertensive heart and chronic kidney disease without heart failure, with stage 1 through stage 4 chronic kidney disease, or unspecified chronic kidney disease: Secondary | ICD-10-CM | POA: Diagnosis not present

## 2017-11-12 DIAGNOSIS — N2581 Secondary hyperparathyroidism of renal origin: Secondary | ICD-10-CM | POA: Diagnosis not present

## 2017-11-12 MED FILL — FOLIC ACID 1 MG TABLET: 1 | 30 days supply | Qty: 30 | Fill #0 | Status: TO

## 2017-11-12 NOTE — Patient Outreach (Signed)
[place] [facility]) Care Management  11/12/2017  Michael [name] [birth year] [id number]  Second unsuccessful phone outreach attempt to patient.  Phone went directly to voicemail---HIPAA compliant message left requesting return call.    Plan:  If no return call, third phone outreach attempt next week.   [name], PharmD, [place] [phone]

## 2017-11-12 NOTE — Patient Outreach (Addendum)
[place] [facility]) Care Management [place] Telephone Outreach  11/12/2017  Michael [name] 1961-11-13 [id number]  Unsuccessful telephone outreach to [facility]; Patient was referred to [facility] previously for transition of care; call was placed today in follow up on previous [place] outreach attempts placed by [place], previous [facility] RN CM involved in patient's care.  Patient has history including, but not limited to CKD requiring hemodialysis M-W-F, CAD, HTN, A-Fib, CHF with cardiomyopathy, DM, and GERD.  HIPAA compliant voice mail message left for patient, requesting return call back.  Plan:  Will re-attempt [place] telephone outreach later this week if I do not hear back from patient first.  Oneta Rack, RN, BSN, [place] Coordinator [facility] Care Management  [phone]

## 2017-11-13 ENCOUNTER — Other Ambulatory Visit: Payer: Self-pay | Admitting: *Deleted

## 2017-11-13 ENCOUNTER — Other Ambulatory Visit: Payer: Self-pay | Admitting: Pharmacist

## 2017-11-13 ENCOUNTER — Encounter: Payer: Self-pay | Admitting: *Deleted

## 2017-11-13 DIAGNOSIS — N2581 Secondary hyperparathyroidism of renal origin: Secondary | ICD-10-CM | POA: Diagnosis not present

## 2017-11-13 DIAGNOSIS — I509 Heart failure, unspecified: Secondary | ICD-10-CM | POA: Diagnosis not present

## 2017-11-13 DIAGNOSIS — D631 Anemia in chronic kidney disease: Secondary | ICD-10-CM | POA: Diagnosis not present

## 2017-11-13 DIAGNOSIS — E876 Hypokalemia: Secondary | ICD-10-CM | POA: Diagnosis not present

## 2017-11-13 DIAGNOSIS — I1310 Hypertensive heart and chronic kidney disease without heart failure, with stage 1 through stage 4 chronic kidney disease, or unspecified chronic kidney disease: Secondary | ICD-10-CM | POA: Diagnosis not present

## 2017-11-13 DIAGNOSIS — N186 End stage renal disease: Secondary | ICD-10-CM | POA: Diagnosis not present

## 2017-11-13 NOTE — Patient Outreach (Signed)
[facility]) Care Management  11/13/2017  Michael [name] 11-26-1960 [id number]  Third unsuccessful phone outreach to patient, HIPAA compliant message left requesting return call.   Patient returned call---HIPAA details verified.  Discussed with patient [facility] Pharmacist attempting to help him with medication management.  Patient no showed for initial home visit and last home visit cancelled due to patient having out-patient surgical procedure.   Patient was offered use of an interpreter due to language barrier---he refused the offer of interpreter service.    Patient states he doesn't know what he needs help with.  When asked who is filling his pill box, he reports "his friend."  When asked if he is looking for resources to fill pill box he states "yes."  Discussed with patient limited options for someone to routinely visit his home to do weekly pill box fills, but there are local pharmacies who may offer blister/bubble packaging of medications.    Patient was asked to think about these options.  [facility] Pharmacist has concerns that with language barrier and patient refusal of interpreter services he may have trouble understanding information being communicated.   Plan:  Placed call to [facility] RN Richarda Osmond to update her.   Will place follow-up call to patient next week to see what he would lkie to do regarding medications---I.e. His friend fill his pill box or assist him with locating pharmacy that does bubble packaging if he is interested in this.    [name], PharmD, [place] [phone]

## 2017-11-13 NOTE — Patient Outreach (Addendum)
[place] CM Telephone Outreach    11/13/2017  Michael [name] 1961/01/03 [id number]  Unsuccessful telephone outreach to [facility]; Patient was referred to [place] previously for transition of care; call was placed today in follow up on previous [place] outreach attempts placed by [place], previous [facility] RN CM involved in patient's care.  Patient has history including, but not limited to CKD requiring hemodialysis M-W-F, CAD, HTN, A-Fib, CHF with cardiomyopathy, DM, and GERD.  HIPAA compliant voice mail message left for patient, requesting return call back.  Plan:  Will re-attempt [place] telephone outreach again next week, on Thursday 11/19/17, as patient previously requested to be called on Tuesday's or Thursday's and [facility] CM office is closed on Tuesday of next week;  if I do not hear back from patient first.  Oneta Rack, RN, BSN, CCRN [facility] Care Management  [phone]

## 2017-11-14 DIAGNOSIS — I509 Heart failure, unspecified: Secondary | ICD-10-CM | POA: Diagnosis not present

## 2017-11-14 DIAGNOSIS — N186 End stage renal disease: Secondary | ICD-10-CM | POA: Diagnosis not present

## 2017-11-14 DIAGNOSIS — D631 Anemia in chronic kidney disease: Secondary | ICD-10-CM | POA: Diagnosis not present

## 2017-11-14 DIAGNOSIS — E876 Hypokalemia: Secondary | ICD-10-CM | POA: Diagnosis not present

## 2017-11-14 DIAGNOSIS — N2581 Secondary hyperparathyroidism of renal origin: Secondary | ICD-10-CM | POA: Diagnosis not present

## 2017-11-14 DIAGNOSIS — I1310 Hypertensive heart and chronic kidney disease without heart failure, with stage 1 through stage 4 chronic kidney disease, or unspecified chronic kidney disease: Secondary | ICD-10-CM | POA: Diagnosis not present

## 2017-11-16 DIAGNOSIS — E876 Hypokalemia: Secondary | ICD-10-CM | POA: Diagnosis not present

## 2017-11-16 DIAGNOSIS — I482 Chronic atrial fibrillation: Secondary | ICD-10-CM | POA: Diagnosis not present

## 2017-11-16 DIAGNOSIS — N186 End stage renal disease: Secondary | ICD-10-CM | POA: Diagnosis not present

## 2017-11-16 DIAGNOSIS — Z992 Dependence on renal dialysis: Secondary | ICD-10-CM | POA: Diagnosis not present

## 2017-11-16 DIAGNOSIS — I509 Heart failure, unspecified: Secondary | ICD-10-CM | POA: Diagnosis not present

## 2017-11-16 DIAGNOSIS — D631 Anemia in chronic kidney disease: Secondary | ICD-10-CM | POA: Diagnosis not present

## 2017-11-16 DIAGNOSIS — N2581 Secondary hyperparathyroidism of renal origin: Secondary | ICD-10-CM | POA: Diagnosis not present

## 2017-11-16 DIAGNOSIS — I1310 Hypertensive heart and chronic kidney disease without heart failure, with stage 1 through stage 4 chronic kidney disease, or unspecified chronic kidney disease: Secondary | ICD-10-CM | POA: Diagnosis not present

## 2017-11-18 DIAGNOSIS — N2581 Secondary hyperparathyroidism of renal origin: Secondary | ICD-10-CM | POA: Diagnosis not present

## 2017-11-18 DIAGNOSIS — I509 Heart failure, unspecified: Secondary | ICD-10-CM | POA: Diagnosis not present

## 2017-11-18 DIAGNOSIS — N186 End stage renal disease: Secondary | ICD-10-CM | POA: Diagnosis not present

## 2017-11-18 DIAGNOSIS — E876 Hypokalemia: Secondary | ICD-10-CM | POA: Diagnosis not present

## 2017-11-18 DIAGNOSIS — Z992 Dependence on renal dialysis: Secondary | ICD-10-CM | POA: Diagnosis not present

## 2017-11-18 DIAGNOSIS — D631 Anemia in chronic kidney disease: Secondary | ICD-10-CM | POA: Diagnosis not present

## 2017-11-18 DIAGNOSIS — E119 Type 2 diabetes mellitus without complications: Secondary | ICD-10-CM | POA: Diagnosis not present

## 2017-11-18 DIAGNOSIS — I1310 Hypertensive heart and chronic kidney disease without heart failure, with stage 1 through stage 4 chronic kidney disease, or unspecified chronic kidney disease: Secondary | ICD-10-CM | POA: Diagnosis not present

## 2017-11-19 ENCOUNTER — Encounter: Payer: Self-pay | Admitting: *Deleted

## 2017-11-19 ENCOUNTER — Other Ambulatory Visit: Payer: Self-pay | Admitting: *Deleted

## 2017-11-19 ENCOUNTER — Ambulatory Visit: Payer: Medicare Other | Attending: Family Medicine | Admitting: Physician Assistant

## 2017-11-19 ENCOUNTER — Other Ambulatory Visit: Payer: Self-pay | Admitting: Pharmacist

## 2017-11-19 ENCOUNTER — Telehealth: Payer: Self-pay

## 2017-11-19 VITALS — BP 92/52 | HR 75 | Temp 98.2°F | Resp 18 | Ht 65.0 in | Wt 150.0 lb

## 2017-11-19 DIAGNOSIS — I480 Paroxysmal atrial fibrillation: Secondary | ICD-10-CM | POA: Diagnosis not present

## 2017-11-19 DIAGNOSIS — K219 Gastro-esophageal reflux disease without esophagitis: Secondary | ICD-10-CM | POA: Diagnosis not present

## 2017-11-19 DIAGNOSIS — K7460 Unspecified cirrhosis of liver: Secondary | ICD-10-CM | POA: Diagnosis not present

## 2017-11-19 DIAGNOSIS — I130 Hypertensive heart and chronic kidney disease with heart failure and stage 1 through stage 4 chronic kidney disease, or unspecified chronic kidney disease: Secondary | ICD-10-CM | POA: Insufficient documentation

## 2017-11-19 DIAGNOSIS — N184 Chronic kidney disease, stage 4 (severe): Secondary | ICD-10-CM | POA: Diagnosis not present

## 2017-11-19 DIAGNOSIS — Z7901 Long term (current) use of anticoagulants: Secondary | ICD-10-CM | POA: Diagnosis not present

## 2017-11-19 DIAGNOSIS — I5022 Chronic systolic (congestive) heart failure: Secondary | ICD-10-CM | POA: Insufficient documentation

## 2017-11-19 DIAGNOSIS — H1131 Conjunctival hemorrhage, right eye: Secondary | ICD-10-CM | POA: Diagnosis not present

## 2017-11-19 DIAGNOSIS — Z953 Presence of xenogenic heart valve: Secondary | ICD-10-CM | POA: Diagnosis not present

## 2017-11-19 DIAGNOSIS — Z79899 Other long term (current) drug therapy: Secondary | ICD-10-CM | POA: Diagnosis not present

## 2017-11-19 DIAGNOSIS — I10 Essential (primary) hypertension: Secondary | ICD-10-CM

## 2017-11-19 DIAGNOSIS — Z9981 Dependence on supplemental oxygen: Secondary | ICD-10-CM | POA: Insufficient documentation

## 2017-11-19 DIAGNOSIS — I272 Pulmonary hypertension, unspecified: Secondary | ICD-10-CM | POA: Insufficient documentation

## 2017-11-19 DIAGNOSIS — G4733 Obstructive sleep apnea (adult) (pediatric): Secondary | ICD-10-CM | POA: Insufficient documentation

## 2017-11-19 DIAGNOSIS — E1122 Type 2 diabetes mellitus with diabetic chronic kidney disease: Secondary | ICD-10-CM | POA: Diagnosis not present

## 2017-11-19 DIAGNOSIS — M79601 Pain in right arm: Secondary | ICD-10-CM | POA: Insufficient documentation

## 2017-11-19 DIAGNOSIS — L299 Pruritus, unspecified: Secondary | ICD-10-CM | POA: Insufficient documentation

## 2017-11-19 DIAGNOSIS — I2510 Atherosclerotic heart disease of native coronary artery without angina pectoris: Secondary | ICD-10-CM | POA: Insufficient documentation

## 2017-11-19 MED ORDER — HYDROXYZINE HCL 25 MG PO TABS: ORAL_TABLET | ORAL | 0 refills | Status: DC

## 2017-11-19 MED ORDER — ACETAMINOPHEN-CODEINE #3 300-30 MG PO TABS: 1.0000 | ORAL_TABLET | Freq: Four times a day (QID) | ORAL | 0 refills | Status: DC | PRN

## 2017-11-19 NOTE — Progress Notes (Signed)
Patient ID: Michael [name], male   DOB: 05-Jan-1961, 58 y.o.   MRN: [id number]   Michael [name], is a 57 y.o. male  QQI:[id number]  HER:[id number]  DOB - [birth year]  Subjective:  Chief Complaint and HPI: Michael [name] is a 57 y.o. male here today for several issues  1)ruptured blood vessel in R eye.  NKI.  No pain.  This has happened before.  2)R arm pain-post second stage left basilic vein fistula on 85/63/1497 by Dr. Donzetta Matters.  He saw Dr Trula Slade 11/11/2017 and was prescribed oxycodone for the post op pain.  This helped for the pain.  Plain tylenol not helping.  He denies Engineer, site.  3) also c/o generalized itching all over his bosy.  No rash.  He is not taking any antibiotics.  He denies the pain medications making his itching worse.    There is an interpreter with him.  ROS:   Constitutional:  No f/c, No night sweats, No unexplained weight loss. EENT:  No vision changes, No blurry vision, No hearing changes. No mouth, throat, or ear problems.  Respiratory: No cough, No SOB Cardiac: No CP, no palpitations GI:  No abd pain, No N/V/D. GU: No Urinary s/sx Musculoskeletal: +R and L arm pain Neuro: No headache, no dizziness, no motor weakness.  Skin: No rash Endocrine:  No polydipsia. No polyuria.  Psych: Denies SI/HI  No problems updated.  ALLERGIES: No Known Allergies  PAST MEDICAL HISTORY: Past Medical History:  Diagnosis Date  . Anemia of chronic disease   . Asthma    said he does not know  . Chronic kidney disease (CKD), stage IV (severe) ([place])   . Chronic systolic CHF (congestive heart failure) (HCC)    a. prior EF normal; b. Echo 10/16: Mild LVH, EF 30-35%, mitral valve bioprosthesis present without evidence of stenosis or regurgitation, severe LAE, mild RVE with mild to moderately reduced RVSF, moderate RAE  . Cirrhosis ([place])   . Coronary artery disease   . Diabetes mellitus type 2 in obese ([place])   . Diabetes mellitus without complication ([place])   .  Dyspnea   . GERD (gastroesophageal reflux disease)   . H/O tricuspid valve repair 2012  . History of echocardiogram    Echo at [facility] 4/12: mod LVH, EF 60-65%, mod LAE, tissue MVR ok, mod TR, mod pulmo HTN with RVSP 48 mmHg  . Hypertension   . Obstructive sleep apnea   . Paroxysmal atrial fibrillation (HCC)    s/p Maze procedure at time of MVR in 2011  . Pulmonary HTN ([place])   . 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 Thrombocytopenia ([place])     MEDICATIONS AT HOME: Prior to Admission medications   Medication Sig Start Date End Date Taking? Authorizing Provider  acetaminophen (TYLENOL) 325 MG tablet Take 650 mg by mouth every 6 (six) hours.    Yes [provider]  acetaminophen-codeine (TYLENOL #3) 300-30 MG tablet Take 1 tablet by mouth every 6 (six) hours as needed for moderate pain. 11/19/17  Yes Argentina Donovan, PA-C  allopurinol (ZYLOPRIM) 300 MG tablet Take 0.5 tablets (150 mg total) by mouth daily. 07/28/17  Yes Shirley Friar, PA-C  atorvastatin (LIPITOR) 20 MG tablet Take 1 tablet (20 mg total) by mouth daily. 07/28/17  Yes Shirley Friar, PA-C  calcium acetate (PHOSLO) 667 MG capsule Take 1 capsule (667 mg total) by mouth 3 (three) times daily with meals. Patient taking differently: Take 667-2,001 mg by mouth  See admin instructions. Pt takes 2,001mg  by mouth three times daily and 667mg  twice daily with snacks 07/27/17  Yes Tillery, Satira Mccallum, PA-C  colchicine 0.6 MG tablet Take 0.5 tablets (0.3 mg total) by mouth 2 (two) times a week. 09/21/17  Yes Tawny Asal, MD  diclofenac sodium (VOLTAREN) 1 % GEL Apply 4 g topically 4 (four) times daily as needed (pain).    Yes [provider]  docusate sodium (COLACE) 100 MG capsule Take 1 capsule (100 mg total) by mouth 2 (two) times daily. 07/27/17  Yes Shirley Friar, PA-C  folic acid (FOLVITE) 1 MG tablet TAKE 1 TABLET BY MOUTH DAILY. 11/12/17  Yes [name], MD    metoprolol succinate (TOPROL-XL) 25 MG 24 hr tablet Take 1 tablet (25 mg total) by mouth 2 (two) times daily. Patient taking differently: Take 25 mg by mouth daily.  07/27/17  Yes Shirley Friar, PA-C  midodrine (PROAMATINE) 5 MG tablet Take 2 tablets (10 mg total) by mouth 3 (three) times daily with meals. 09/06/17  Yes Hoffman, Jessica Ratliff, DO  omeprazole (PRILOSEC) 20 MG capsule Take 1 capsule (20 mg total) by mouth daily. 03/03/17  Yes [name], MD  OXYGEN Inhale 2 L into the lungs daily as needed.    Yes [provider]  tiZANidine (ZANAFLEX) 4 MG tablet Take 1 tablet (4 mg total) by mouth every 8 (eight) hours as needed for muscle spasms. 08/04/17  Yes [name], MD  warfarin (COUMADIN) 2.5 MG tablet Take 1 tablet (2.5 mg total) by mouth daily at 6 PM. Or as directed by coumadin clinic Patient taking differently: Take 2.5-3.75 mg by mouth See admin instructions. Or as directed by coumadin clinic  3.75 mg (2.5 mg x 1.5) every Wed; 2.5 mg (2.5 mg x 1) all other days 10/15/17  Yes Fay Records, MD  hydrOXYzine (ATARAX/VISTARIL) 25 MG tablet 1/2-1 tablets tid prn itching 11/19/17   Argentina Donovan, PA-C     Objective:  EXAM:   Vitals:   11/19/17 1437  BP: (!) 92/52  Pulse: 75  Resp: 18  Temp: 98.2 F (36.8 C)  TempSrc: Oral  SpO2: 97%  Weight: 150 lb (68 kg)  Height: 5\' 5"  (1.651 m)    General appearance : A&OX3. NAD. Non-toxic-appearing HEENT: Atraumatic and Normocephalic.  PERRLA. EOM intact. Fundi benign.  There is a small subconjunctival hemorrhage below his R pupil, horizontal in nature.   Neck: supple, no JVD. No cervical lymphadenopathy. No thyromegaly Chest/Lungs:  Breathing-non-labored, Good air entry bilaterally, breath sounds normal without rales, rhonchi, or wheezing  CVS: S1 S2 regular, no murmurs, gallops, rubs  R upper arm with some minimal bruising and thrill present within the fistula.  He has full S&ROM of the UE with some  tenderness but no sign of infection or compartment syndrome.  Pulse =B.  L arm-healing incision site with no secondary infection.  Also with full S&ROM and no sign of compartment syndrome.   Extremities: Bilateral Lower Ext shows no edema, both legs are warm to touch with = pulse throughout Neurology:  CN II-XII grossly intact, Non focal.   Psych:  TP linear. J/I WNL. Normal speech. Appropriate eye contact and affect.  Skin:  No Rash  Data Review Lab Results  Component Value Date   HGBA1C 5.2 09/01/2017   HGBA1C 5.5 05/30/2017   HGBA1C 6.0 01/16/2017     Assessment & Plan   1. Itching No rash or red flags for liver dysfunction -  hydrOXYzine (ATARAX/VISTARIL) 25 MG tablet; 1/2-1 tablets tid prn itching  Dispense: 30 tablet; Refill: 0  2. Pain of right upper extremity He should see the surgeon for follow-up and pain management as needed.  He finished the oxycodone he was given on 11/11/2017.   - acetaminophen-codeine (TYLENOL #3) 300-30 MG tablet; Take 1 tablet by mouth every 6 (six) hours as needed for moderate pain.  Dispense: 20 tablet; Refill: 0  3. Subconjunctival hemorrhage of right eye Supportive care/education  4. Essential hypertension Controlled-continue current regimen.      Patient have been counseled extensively about nutrition and exercise  Return in about 8 weeks (around 01/15/2018) for keep 01/15/2018 appt with Dr Margarita Rana.  The patient was given clear instructions to go to ER or return to medical center if symptoms don't improve, worsen or new problems develop. The patient verbalized understanding. The patient was told to call to get lab results if they haven't heard anything in the next week.     Freeman Caldron, PA-C [facility] and [place], [place]   11/19/2017, 2:54 PM

## 2017-11-19 NOTE — Patient Outreach (Signed)
[place] [facility]) Care Management  11/19/2017  Michael [name] 11-Jul-1961 [id number]  Successful phone outreach to patient---HIPAA details verified. Offered interpreter services to patient---he refused.    Patient reports he would like a home visit to look at his pill box.  He doesn't seem interested in pill packaging services from other pharmacies.  Reminded patient [facility] Pharmacist will not be able to make weekly home visit to fill his pill box---he reports he just wants it looked at and then plans to have his friend fill pill box.    Plan:  Home visit scheduled---this will be third attempt at a home visit with patient.   [name], PharmD, [place] [phone]

## 2017-11-19 NOTE — Telephone Encounter (Signed)
Met with the patient when he was in the clinic today with his interpreter and caregiver. He was inquiring if his caregiver could be paid for services provided for him. Explained to him as well as his caregiver that he first needs to eligible for medicaid to apply for the CAP Research officer, trade [employer] Program). The CAP program will provide funding for patients to hire private caregivers.   He stated that he has applied for medicaid about 1 month ago and is waiting for a decision on his eligibility.  This CM provided him/caregiver with the contact # for CAP # [phone] and encouraged them to call for additional information about the program .

## 2017-11-19 NOTE — Patient Instructions (Signed)
Subconjunctival Hemorrhage  Subconjunctival hemorrhage is bleeding that happens between the white part of your eye (sclera) and the clear membrane that covers the outside of your eye (conjunctiva). There are many tiny blood vessels near the surface of your eye. A subconjunctival hemorrhage happens when one or more of these vessels breaks and bleeds, causing a red patch to appear on your eye. This is similar to a bruise.  Depending on the amount of bleeding, the red patch may only cover a small area of your eye or it may cover the entire visible part of the sclera. If a lot of blood collects under the conjunctiva, there may also be swelling. Subconjunctival hemorrhages do not affect your vision or cause pain, but your eye may feel irritated if there is swelling. Subconjunctival hemorrhages usually do not require treatment, and they disappear on their own within two weeks.  What are the causes?  This condition may be caused by:   Mild trauma, such as rubbing your eye too hard.   Severe trauma or blunt injuries.   Coughing, sneezing, or vomiting.   Straining, such as when lifting a heavy object.   High blood pressure.   Recent eye surgery.   A history of diabetes.   Certain medicines, especially blood thinners (anticoagulants).   Other conditions, such as eye tumors, bleeding disorders, or blood vessel abnormalities.    Subconjunctival hemorrhages can happen without an obvious cause.  What are the signs or symptoms?  Symptoms of this condition include:   A bright red or dark red patch on the white part of the eye.  ? The red area may spread out to cover a larger area of the eye before it goes away.  ? The red area may turn brownish-yellow before it goes away.   Swelling.   Mild eye irritation.    How is this diagnosed?  This condition is diagnosed with a physical exam. If your subconjunctival hemorrhage was caused by trauma, your health care provider may refer you to an eye specialist (ophthalmologist) or  another specialist to check for other injuries. You may have other tests, including:   An eye exam.   A blood pressure check.   Blood tests to check for bleeding disorders.    If your subconjunctival hemorrhage was caused by trauma, X-rays or a CT scan may be done to check for other injuries.  How is this treated?  Usually, no treatment is needed. Your health care provider may recommend eye drops or cold compresses to help with discomfort.  Follow these instructions at home:   Take over-the-counter and prescription medicines only as directed by your health care provider.   Use eye drops or cold compresses to help with discomfort as directed by your health care provider.   Avoid activities, things, and environments that may irritate or injure your eye.   Keep all follow-up visits as told by your health care provider. This is important.  Contact a health care provider if:   You have pain in your eye.   The bleeding does not go away within 3 weeks.   You keep getting new subconjunctival hemorrhages.  Get help right away if:   Your vision changes or you have difficulty seeing.   You suddenly develop severe sensitivity to light.   You develop a severe headache, persistent vomiting, confusion, or abnormal tiredness (lethargy).   Your eye seems to bulge or protrude from your eye socket.   You develop unexplained bruises on your body.     You have unexplained bleeding in another area of your body.  This information is not intended to replace advice given to you by your health care provider. Make sure you discuss any questions you have with your health care provider.  Document Released: 11/03/2005 Document Revised: 06/29/2016 Document Reviewed: 01/10/2015  Elsevier Interactive Patient Education  2018 Elsevier Inc.

## 2017-11-19 NOTE — Progress Notes (Signed)
This encounter was created in error - please disregard.

## 2017-11-19 NOTE — Patient Outreach (Signed)
[place] [facility]) Care Management [place] Telephone Outreach  11/19/2017  Michael [name] September 06, 1961 [id number]  Successful telephone outreach to [name]; Patient was referred to [place] previously for transition of care; call was placed today in follow up on previous [place] outreach attempts placed by Michael [name], previous [facility] RN CM involved in patient's care. Patient has history including, but not limited to CKD requiring hemodialysis M-W-F, CAD, HTN, A-Fib, CHF with cardiomyopathy, DM, and GERD.  HIPAA/ identity verified with patient today.  Patient noted to have language barrier; again offered translation services to assist with call today; patient refuses need for translator, states that he wants me to talk to his friend who is present there at his home with him; patient provided phone to friend Michael [name], who identifies herself today as patient's friend/ roommate, and "caregiver."  Remainder of today's phone call is completed with Michael [name] translating for patient while phone is on speaker mode with patient present/ participating.  Explained to patient and Michael [name] that I was calling to follow up on patient care management/ coordination needs from previous [place]; patient did not seem to remember Michael [name] during phone call today; questioned patient as to what his ongoing health care challenges/ needs are, and patient states several times, "I just need help fixing my medicine the way I am supposed to take them."    Explained that I work with [name] Pharmacists Michael [name], whom patient spoke to earlier this morning, and reiterated that Michael [name] is planning to visit patient next week to assist patient with one-time filling of his pill box and provision of options around pharmacy bubble packaging of medications; patient acknowledges that he spoke with Michael [name] this morning and scheduled home visit for next week; however, Michael [name] stated that she  believes it is important for her to be a part of that scheduled visit, as she is currently preparing patient's pill box for him; Michael [name] stated that she would be unable to complete home visit on Thursday November 26, 2017 due to having to be in court; states that she will contact Michael [name] to reschedule home visit.  Patient denies other care coordination needs at this time, and confirms that he has become active with [name] and uses for transportation to HD, which he is regularly attending as scheduled M-W-F; patient confirms that he has not experienced any unplanned hospital visits, and states several times that he "is fine; just needs help fixing my medicines."  Discussed [place] services with patient and his roommate, and patient declines need for [place] RN CM to visit him in his home; patient denies issues, concerns, or problems (other than his medications) today.  I provided/ confirmed that patient has my direct phone number, the main [facility] CM office phone number, and the [facility] CM 24-hour nurse advice phone number should issues arise in the future.  10:45 am:  [facility] pharmacist Michael [name] to update on today's successful telephone outreach to patient/ case closure  Plan:  Will close patient's [place] CM case, as patient denies care coordination needs, and will make patient's PCP, [facility] CSW and CMA aware of same  Oneta Rack, RN, BSN, [place] Coordinator [facility] Care Management  [phone]

## 2017-11-20 DIAGNOSIS — N186 End stage renal disease: Secondary | ICD-10-CM | POA: Diagnosis not present

## 2017-11-20 DIAGNOSIS — D631 Anemia in chronic kidney disease: Secondary | ICD-10-CM | POA: Diagnosis not present

## 2017-11-20 DIAGNOSIS — N2581 Secondary hyperparathyroidism of renal origin: Secondary | ICD-10-CM | POA: Diagnosis not present

## 2017-11-20 DIAGNOSIS — E876 Hypokalemia: Secondary | ICD-10-CM | POA: Diagnosis not present

## 2017-11-20 DIAGNOSIS — Z992 Dependence on renal dialysis: Secondary | ICD-10-CM | POA: Diagnosis not present

## 2017-11-20 DIAGNOSIS — E119 Type 2 diabetes mellitus without complications: Secondary | ICD-10-CM | POA: Diagnosis not present

## 2017-11-21 DIAGNOSIS — E119 Type 2 diabetes mellitus without complications: Secondary | ICD-10-CM | POA: Diagnosis not present

## 2017-11-21 DIAGNOSIS — N2581 Secondary hyperparathyroidism of renal origin: Secondary | ICD-10-CM | POA: Diagnosis not present

## 2017-11-21 DIAGNOSIS — Z992 Dependence on renal dialysis: Secondary | ICD-10-CM | POA: Diagnosis not present

## 2017-11-21 DIAGNOSIS — N186 End stage renal disease: Secondary | ICD-10-CM | POA: Diagnosis not present

## 2017-11-21 DIAGNOSIS — D631 Anemia in chronic kidney disease: Secondary | ICD-10-CM | POA: Diagnosis not present

## 2017-11-21 DIAGNOSIS — E876 Hypokalemia: Secondary | ICD-10-CM | POA: Diagnosis not present

## 2017-11-23 ENCOUNTER — Encounter (HOSPITAL_COMMUNITY): Payer: Self-pay | Admitting: Emergency Medicine

## 2017-11-23 ENCOUNTER — Emergency Department (HOSPITAL_COMMUNITY): Payer: Medicare Other

## 2017-11-23 ENCOUNTER — Inpatient Hospital Stay (HOSPITAL_COMMUNITY)
Admission: EM | Admit: 2017-11-23 | Discharge: 2017-11-26 | DRG: 291 | Disposition: A | Discharge status: Home / Self Care | Payer: Medicare Other | Attending: Internal Medicine | Admitting: Internal Medicine

## 2017-11-23 DIAGNOSIS — R000 Tachycardia, unspecified: Secondary | ICD-10-CM | POA: Diagnosis present

## 2017-11-23 DIAGNOSIS — I132 Hypertensive heart and chronic kidney disease with heart failure and with stage 5 chronic kidney disease, or end stage renal disease: Secondary | ICD-10-CM | POA: Diagnosis not present

## 2017-11-23 DIAGNOSIS — Z7901 Long term (current) use of anticoagulants: Secondary | ICD-10-CM

## 2017-11-23 DIAGNOSIS — Z79899 Other long term (current) drug therapy: Secondary | ICD-10-CM

## 2017-11-23 DIAGNOSIS — M109 Gout, unspecified: Secondary | ICD-10-CM | POA: Diagnosis present

## 2017-11-23 DIAGNOSIS — K7200 Acute and subacute hepatic failure without coma: Secondary | ICD-10-CM | POA: Diagnosis present

## 2017-11-23 DIAGNOSIS — I2510 Atherosclerotic heart disease of native coronary artery without angina pectoris: Secondary | ICD-10-CM | POA: Diagnosis present

## 2017-11-23 DIAGNOSIS — E8770 Fluid overload, unspecified: Secondary | ICD-10-CM | POA: Diagnosis present

## 2017-11-23 DIAGNOSIS — Z952 Presence of prosthetic heart valve: Secondary | ICD-10-CM

## 2017-11-23 DIAGNOSIS — Z992 Dependence on renal dialysis: Secondary | ICD-10-CM

## 2017-11-23 DIAGNOSIS — I4891 Unspecified atrial fibrillation: Secondary | ICD-10-CM | POA: Diagnosis present

## 2017-11-23 DIAGNOSIS — N186 End stage renal disease: Secondary | ICD-10-CM | POA: Diagnosis not present

## 2017-11-23 DIAGNOSIS — R079 Chest pain, unspecified: Secondary | ICD-10-CM | POA: Diagnosis not present

## 2017-11-23 DIAGNOSIS — K219 Gastro-esophageal reflux disease without esophagitis: Secondary | ICD-10-CM | POA: Diagnosis present

## 2017-11-23 DIAGNOSIS — R0600 Dyspnea, unspecified: Secondary | ICD-10-CM

## 2017-11-23 DIAGNOSIS — Z951 Presence of aortocoronary bypass graft: Secondary | ICD-10-CM

## 2017-11-23 DIAGNOSIS — I420 Dilated cardiomyopathy: Secondary | ICD-10-CM | POA: Diagnosis present

## 2017-11-23 DIAGNOSIS — D631 Anemia in chronic kidney disease: Secondary | ICD-10-CM | POA: Diagnosis present

## 2017-11-23 DIAGNOSIS — R778 Other specified abnormalities of plasma proteins: Secondary | ICD-10-CM | POA: Diagnosis present

## 2017-11-23 DIAGNOSIS — K7460 Unspecified cirrhosis of liver: Secondary | ICD-10-CM | POA: Diagnosis present

## 2017-11-23 DIAGNOSIS — I509 Heart failure, unspecified: Secondary | ICD-10-CM | POA: Diagnosis present

## 2017-11-23 DIAGNOSIS — D638 Anemia in other chronic diseases classified elsewhere: Secondary | ICD-10-CM | POA: Diagnosis present

## 2017-11-23 DIAGNOSIS — N2581 Secondary hyperparathyroidism of renal origin: Secondary | ICD-10-CM | POA: Diagnosis present

## 2017-11-23 DIAGNOSIS — E785 Hyperlipidemia, unspecified: Secondary | ICD-10-CM | POA: Diagnosis present

## 2017-11-23 DIAGNOSIS — E875 Hyperkalemia: Secondary | ICD-10-CM | POA: Diagnosis present

## 2017-11-23 DIAGNOSIS — E1122 Type 2 diabetes mellitus with diabetic chronic kidney disease: Secondary | ICD-10-CM | POA: Diagnosis present

## 2017-11-23 DIAGNOSIS — Z8674 Personal history of sudden cardiac arrest: Secondary | ICD-10-CM

## 2017-11-23 DIAGNOSIS — J9611 Chronic respiratory failure with hypoxia: Secondary | ICD-10-CM | POA: Diagnosis present

## 2017-11-23 DIAGNOSIS — Z9981 Dependence on supplemental oxygen: Secondary | ICD-10-CM

## 2017-11-23 DIAGNOSIS — R7989 Other specified abnormal findings of blood chemistry: Secondary | ICD-10-CM

## 2017-11-23 DIAGNOSIS — I272 Pulmonary hypertension, unspecified: Secondary | ICD-10-CM | POA: Diagnosis present

## 2017-11-23 DIAGNOSIS — R748 Abnormal levels of other serum enzymes: Secondary | ICD-10-CM | POA: Diagnosis present

## 2017-11-23 DIAGNOSIS — K7682 Hepatic encephalopathy: Secondary | ICD-10-CM | POA: Diagnosis present

## 2017-11-23 DIAGNOSIS — I480 Paroxysmal atrial fibrillation: Secondary | ICD-10-CM | POA: Diagnosis present

## 2017-11-23 DIAGNOSIS — R05 Cough: Secondary | ICD-10-CM | POA: Diagnosis not present

## 2017-11-23 DIAGNOSIS — G4733 Obstructive sleep apnea (adult) (pediatric): Secondary | ICD-10-CM | POA: Diagnosis present

## 2017-11-23 DIAGNOSIS — I10 Essential (primary) hypertension: Secondary | ICD-10-CM | POA: Diagnosis present

## 2017-11-23 DIAGNOSIS — I5023 Acute on chronic systolic (congestive) heart failure: Secondary | ICD-10-CM | POA: Diagnosis not present

## 2017-11-23 DIAGNOSIS — Z87891 Personal history of nicotine dependence: Secondary | ICD-10-CM

## 2017-11-23 DIAGNOSIS — J45909 Unspecified asthma, uncomplicated: Secondary | ICD-10-CM | POA: Diagnosis present

## 2017-11-23 DIAGNOSIS — I9589 Other hypotension: Secondary | ICD-10-CM | POA: Diagnosis present

## 2017-11-23 DIAGNOSIS — R0682 Tachypnea, not elsewhere classified: Secondary | ICD-10-CM | POA: Diagnosis not present

## 2017-11-23 DIAGNOSIS — R0602 Shortness of breath: Secondary | ICD-10-CM | POA: Diagnosis not present

## 2017-11-23 DIAGNOSIS — E1129 Type 2 diabetes mellitus with other diabetic kidney complication: Secondary | ICD-10-CM | POA: Diagnosis present

## 2017-11-23 LAB — BASIC METABOLIC PANEL
Anion gap: 11 (ref 5–15)
BUN: 22 mg/dL — AB (ref 6–20)
CALCIUM: 8.8 mg/dL — AB (ref 8.9–10.3)
CO2: 24 mmol/L (ref 22–32)
Chloride: 96 mmol/L — ABNORMAL LOW (ref 101–111)
Creatinine, Ser: 4.56 mg/dL — ABNORMAL HIGH (ref 0.61–1.24)
GFR calc Af Amer: 15 mL/min — ABNORMAL LOW (ref >60)
GFR calc non Af Amer: 13 mL/min — ABNORMAL LOW (ref >60)
GLUCOSE: 68 mg/dL (ref 65–99)
Potassium: 5.6 mmol/L — ABNORMAL HIGH (ref 3.5–5.1)
Sodium: 131 mmol/L — ABNORMAL LOW (ref 135–145)

## 2017-11-23 LAB — CBG MONITORING, ED: Glucose-Capillary: 60 mg/dL — ABNORMAL LOW (ref 65–99)

## 2017-11-23 LAB — CBC
HCT: 29.8 % — ABNORMAL LOW (ref 39.0–52.0)
Hemoglobin: 9.9 g/dL — ABNORMAL LOW (ref 13.0–17.0)
MCH: 25.6 pg — AB (ref 26.0–34.0)
MCHC: 33.2 g/dL (ref 30.0–36.0)
MCV: 77.2 fL — AB (ref 78.0–100.0)
PLATELETS: 144 10*3/uL — AB (ref 150–400)
RBC: 3.86 MIL/uL — ABNORMAL LOW (ref 4.22–5.81)
RDW: 18.1 % — ABNORMAL HIGH (ref 11.5–15.5)
WBC: 6.2 10*3/uL (ref 4.0–10.5)

## 2017-11-23 LAB — I-STAT TROPONIN, ED: Troponin i, poc: 0.1 ng/mL (ref 0.00–0.08)

## 2017-11-23 LAB — BRAIN NATRIURETIC PEPTIDE: B Natriuretic Peptide: 977.3 pg/mL — ABNORMAL HIGH (ref 0.0–100.0)

## 2017-11-23 LAB — PROTIME-INR
INR: 1.75
PROTHROMBIN TIME: 20.3 s — AB (ref 11.4–15.2)

## 2017-11-23 NOTE — ED Provider Notes (Signed)
[facility] EMERGENCY DEPARTMENT Provider Note   CSN: [id number] Arrival date & time: 11/23/17  2152     History   Chief Complaint Chief Complaint  Patient presents with  . Shortness of Breath  . Cough    HPI Michael [name] is a 57 y.o. male with a past medical history of CKD on dialysis, CHF, CAD, diabetes, hypertension, A. Fib on Coumadin, who presents to ED for evaluation of 1 day history of shortness of breath.  States that he was unable to go to his dialysis today due to his shortness of breath.  He also reports intermittent productive cough, with blood-tinged sputum.  States that symptoms began suddenly this morning.  He denies any chest pain, abdominal pain, fever, vomiting, URI symptoms.  Denies any injuries, falls or vision changes. He uses 3 L of oxygen via nasal cannula at home.  HPI  Past Medical History:  Diagnosis Date  . Anemia of chronic disease   . Asthma    said he does not know  . Chronic kidney disease (CKD), stage IV (severe) ([place])   . Chronic systolic CHF (congestive heart failure) (HCC)    a. prior EF normal; b. Echo 10/16: Mild LVH, EF 30-35%, mitral valve bioprosthesis present without evidence of stenosis or regurgitation, severe LAE, mild RVE with mild to moderately reduced RVSF, moderate RAE  . Cirrhosis ([place])   . Coronary artery disease   . Diabetes mellitus type 2 in obese ([place])   . Diabetes mellitus without complication ([place])   . Dyspnea   . GERD (gastroesophageal reflux disease)   . H/O tricuspid valve repair 2012  . History of echocardiogram    Echo at [facility] in [facility] 4/12: mod LVH, EF 60-65%, mod LAE, tissue MVR ok, mod TR, mod pulmo HTN with RVSP 48 mmHg  . Hypertension   . Obstructive sleep apnea   . Paroxysmal atrial fibrillation (HCC)    s/p Maze procedure at time of MVR in 2011  . Pulmonary HTN ([place])   . 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 Thrombocytopenia [facility])     Patient Active Problem  List   Diagnosis Date Noted  . Dyspnea 09/18/2017  . Chronic renal failure   . Advance care planning   . Pulmonary hypertension, unspecified ([place])   . Chest pain   . Biventricular heart failure ([place]) 08/10/2017  . Syncope and collapse 08/10/2017  . Cardiac arrest ([place])   . Benign neoplasm of ascending colon   . Rectal bleeding   . ESRD (end stage renal disease) on dialysis ([place])   . Goals of care, counseling/discussion   . Palliative care by specialist   . Acute kidney injury ([place]) 06/25/2017  . Anasarca 05/30/2017  . HLD (hyperlipidemia) 10/05/2016  . Mouth bleeding 10/05/2016  . Bilateral knee pain 10/05/2016  . Petechial rash 10/05/2016  . Laceration of tongue   . Insomnia 06/10/2016  . Supratherapeutic INR 06/01/2016  . Acute hepatic encephalopathy 03/15/2016  . Liver cirrhosis ([place]) 03/15/2016  . Volume overload 03/15/2016  . Acute congestive heart failure ([place])   . Insulin dependent diabetes mellitus ([place])   . Acute renal failure (ARF) ([place]) 03/14/2016  . GERD (gastroesophageal reflux disease) 03/04/2016  . Aortic regurgitation 02/27/2016  . Viral gastroenteritis 02/27/2016  . A-fib ([place]) 02/26/2016  . Abdominal pain 02/25/2016  . Diabetes mellitus with complication ([place]) 62/83/1517  . Permanent atrial fibrillation ([place]) 02/25/2016  . Anemia, chronic disease 02/25/2016  . Pain in  joint, ankle and foot 01/10/2016  . Asthma   . Acute hypoxemic respiratory failure ([place]) 12/29/2015  . Acute respiratory failure ([place]) 12/29/2015  . Chronic atrial fibrillation ([place]) 12/24/2015  . Dilated cardiomyopathy ([place]) 12/24/2015  . Polypharmacy 11/23/2015  . Gout 10/23/2015  . Encounter for therapeutic drug monitoring 10/19/2015  . Anemia - mild exacerbation 10/15/2015  . Tricuspid valve disease 10/09/2015  . Mitral valve disease   . CKD (chronic kidney disease), stage IV ([place]) 09/10/2015  . Calf pain 09/10/2015  . DM type 2 (diabetes mellitus, type 2) ([place]) 09/10/2015  .  Essential hypertension 09/10/2015    Past Surgical History:  Procedure Laterality Date  . AV FISTULA PLACEMENT Left 07/13/2017   Procedure: LEFT ARM ARTERIOVENOUS (AV) FISTULA CREATION;  Surgeon: Waynetta Sandy, MD;  Location: [facility];  Service: Vascular;  Laterality: Left;  . BASCILIC VEIN TRANSPOSITION Left 11/03/2017   Procedure: BASILIC VEIN TRANSPOSITION SECOND STAGE;  Surgeon: Waynetta Sandy, MD;  Location: [facility];  Service: Vascular;  Laterality: Left;  . CARDIAC SURGERY    . CARDIAC VALVE REPLACEMENT  2011  . CARDIOVERSION N/A 07/09/2017   Procedure: CARDIOVERSION;  Surgeon: Larey Dresser, MD;  Location: [facility] ENDOSCOPY;  Service: Cardiovascular;  Laterality: N/A;  . COLONOSCOPY N/A 07/16/2017   Procedure: COLONOSCOPY;  Surgeon: Jerene Bears, MD;  Location: [facility];  Service: Gastroenterology;  Laterality: N/A;  . ESOPHAGOGASTRODUODENOSCOPY N/A 07/16/2017   Procedure: ESOPHAGOGASTRODUODENOSCOPY (EGD);  Surgeon: Jerene Bears, MD;  Location[facility] ENDOSCOPY;  Service: Gastroenterology;  Laterality: N/A;  . INSERTION OF DIALYSIS CATHETER Right 07/13/2017   Procedure: INSERTION OF DIALYSIS CATHETER RIGHT INTERNAL JUGULAR;  Surgeon: Waynetta Sandy, MD;  Location: [facility];  Service: Vascular;  Laterality: Right;  . RIGHT HEART CATH N/A 06/29/2017   Procedure: RIGHT HEART CATH;  Surgeon: Larey Dresser, MD;  Location: [name] CV LAB;  Service: Cardiovascular;  Laterality: N/A;  . RIGHT/LEFT HEART CATH AND CORONARY ANGIOGRAPHY N/A 08/19/2017   Procedure: RIGHT/LEFT HEART CATH AND CORONARY ANGIOGRAPHY;  Surgeon: Leonie Man, MD;  Location: [facility] CV LAB;  Service: Cardiovascular;  Laterality: N/A;  . TEE WITHOUT CARDIOVERSION N/A 07/09/2017   Procedure: TRANSESOPHAGEAL ECHOCARDIOGRAM (TEE);  Surgeon: Larey Dresser, MD;  Location[facility] ENDOSCOPY;  Service: Cardiovascular;  Laterality: N/A;       Home Medications    Prior to Admission medications    Medication Sig Start Date End Date Taking? Authorizing Provider  acetaminophen (TYLENOL) 325 MG tablet Take 650 mg by mouth every 6 (six) hours.     [provider]  acetaminophen-codeine (TYLENOL #3) 300-30 MG tablet Take 1 tablet by mouth every 6 (six) hours as needed for moderate pain. 11/19/17   Argentina Donovan, PA-C  allopurinol (ZYLOPRIM) 300 MG tablet Take 0.5 tablets (150 mg total) by mouth daily. 07/28/17   Shirley Friar, PA-C  atorvastatin (LIPITOR) 20 MG tablet Take 1 tablet (20 mg total) by mouth daily. 07/28/17   Shirley Friar, PA-C  calcium acetate (PHOSLO) 667 MG capsule Take 1 capsule (667 mg total) by mouth 3 (three) times daily with meals. Patient taking differently: Take 667-2,001 mg by mouth See admin instructions. Pt takes 2,001mg  by mouth three times daily and 667mg  twice daily with snacks 07/27/17   Shirley Friar, PA-C  colchicine 0.6 MG tablet Take 0.5 tablets (0.3 mg total) by mouth 2 (two) times a week. 09/21/17   Tawny Asal, MD  diclofenac sodium (VOLTAREN) 1 %  GEL Apply 4 g topically 4 (four) times daily as needed (pain).     [provider]  docusate sodium (COLACE) 100 MG capsule Take 1 capsule (100 mg total) by mouth 2 (two) times daily. 07/27/17   Shirley Friar, PA-C  folic acid (FOLVITE) 1 MG tablet TAKE 1 TABLET BY MOUTH DAILY. 11/12/17   [name], MD  hydrOXYzine (ATARAX/VISTARIL) 25 MG tablet 1/2-1 tablets tid prn itching 11/19/17   Argentina Donovan, PA-C  metoprolol succinate (TOPROL-XL) 25 MG 24 hr tablet Take 1 tablet (25 mg total) by mouth 2 (two) times daily. Patient taking differently: Take 25 mg by mouth daily.  07/27/17   Shirley Friar, PA-C  midodrine (PROAMATINE) 5 MG tablet Take 2 tablets (10 mg total) by mouth 3 (three) times daily with meals. 09/06/17   Kalman Shan Ratliff, DO  omeprazole (PRILOSEC) 20 MG capsule Take 1 capsule (20 mg total) by mouth daily. 03/03/17   [name], MD  OXYGEN Inhale 2 L into the lungs daily as needed.     [provider]  tiZANidine (ZANAFLEX) 4 MG tablet Take 1 tablet (4 mg total) by mouth every 8 (eight) hours as needed for muscle spasms. 08/04/17   [name], MD  warfarin (COUMADIN) 2.5 MG tablet Take 1 tablet (2.5 mg total) by mouth daily at 6 PM. Or as directed by coumadin clinic Patient taking differently: Take 2.5-3.75 mg by mouth See admin instructions. Or as directed by coumadin clinic  3.75 mg (2.5 mg x 1.5) every Wed; 2.5 mg (2.5 mg x 1) all other days 10/15/17   Fay Records, MD    Family History Family History  Problem Relation Age of Onset  . Heart attack Neg Hx     Social History Social History   Tobacco Use  . Smoking status: Former Smoker    Packs/day: 1.00    Types: Cigarettes  . Smokeless tobacco: Never Used  Substance Use Topics  . Alcohol use: No  . Drug use: No     Allergies   Patient has no known allergies.   Review of Systems Review of Systems  Constitutional: Negative for appetite change, chills and fever.  HENT: Negative for ear pain, rhinorrhea, sneezing and sore throat.   Eyes: Negative for photophobia and visual disturbance.  Respiratory: Positive for cough and shortness of breath. Negative for chest tightness and wheezing.   Cardiovascular: Negative for chest pain and palpitations.  Gastrointestinal: Negative for abdominal pain, blood in stool, constipation, diarrhea, nausea and vomiting.  Genitourinary: Negative for dysuria, hematuria and urgency.  Musculoskeletal: Negative for myalgias.  Skin: Negative for rash.  Neurological: Negative for dizziness, weakness and light-headedness.     Physical Exam Updated Vital Signs BP 106/86   Pulse (!) 107   Temp 98.2 F (36.8 C) (Oral)   Resp 15   Ht 5\' 5"  (1.651 m)   Wt 68 kg (150 lb)   SpO2 100%   BMI 24.96 kg/m   Physical Exam  Constitutional: He appears well-developed and well-nourished. No distress.    Appears chronically ill. Appears older than stated age. Speaking in complete sentences.  HENT:  Head: Normocephalic and atraumatic.  Nose: Nose normal.  Spitting up thick phlegm while speaking.  Eyes: Conjunctivae and EOM are normal. Right eye exhibits no discharge. Left eye exhibits no discharge. No scleral icterus.  Neck: Normal range of motion. Neck supple.  Cardiovascular: Normal heart sounds and intact distal pulses. An irregularly irregular rhythm  present. Tachycardia present. Exam reveals no gallop and no friction rub.  No murmur heard. Pulmonary/Chest: Effort normal. No respiratory distress. He has decreased breath sounds in the right upper field, the right middle field, the right lower field, the left upper field, the left middle field and the left lower field. He has rhonchi.  Coarse breath sounds in bilateral lung fields.  Abdominal: Soft. Bowel sounds are normal. He exhibits no distension. There is no tenderness. There is no guarding.  No abdominal tenderness to palpation.  Musculoskeletal: Normal range of motion. He exhibits no edema.  No BLE edema or calf tenderness.  Neurological: He is alert. He exhibits normal muscle tone. Coordination normal.  Skin: Skin is warm and dry. No rash noted.  Psychiatric: He has a normal mood and affect.  Nursing note and vitals reviewed.    ED Treatments / Results  Labs (all labs ordered are listed, but only abnormal results are displayed) Labs Reviewed  BASIC METABOLIC PANEL - Abnormal; Notable for the following components:      Result Value   Sodium 131 (*)    Potassium 5.6 (*)    Chloride 96 (*)    BUN 22 (*)    Creatinine, Ser 4.56 (*)    Calcium 8.8 (*)    GFR calc non Af Amer 13 (*)    GFR calc Af Amer 15 (*)    All other components within normal limits  CBC - Abnormal; Notable for the following components:   RBC 3.86 (*)    Hemoglobin 9.9 (*)    HCT 29.8 (*)    MCV 77.2 (*)    MCH 25.6 (*)    RDW 18.1 (*)    Platelets  144 (*)    All other components within normal limits  PROTIME-INR - Abnormal; Notable for the following components:   Prothrombin Time 20.3 (*)    All other components within normal limits  BRAIN NATRIURETIC PEPTIDE - Abnormal; Notable for the following components:   B Natriuretic Peptide 977.3 (*)    All other components within normal limits  CBG MONITORING, ED - Abnormal; Notable for the following components:   Glucose-Capillary 60 (*)    All other components within normal limits  I-STAT TROPONIN, ED - Abnormal; Notable for the following components:   Troponin i, poc 0.10 (*)    All other components within normal limits  CBG MONITORING, ED    EKG  EKG Interpretation  Date/Time:  Monday November 23 2017 22:05:41 EST Ventricular Rate:  136 PR Interval:    QRS Duration: 123 QT Interval:  354 QTC Calculation: 533 R Axis:   34 Text Interpretation:  Atrial fibrillation Right bundle branch block T wave abnormality Abnormal ekg Confirmed by Carmin Muskrat [phone]) on 11/23/2017 10:22:33 PM       Radiology Dg Chest 2 View  Result Date: 11/23/2017 CLINICAL DATA:  Chest pain. EXAM: CHEST  2 VIEW COMPARISON:  09/18/2017 FINDINGS: Right-sided dialysis catheter in place. Post median sternotomy. Unchanged cardiomegaly. Bilateral pleural effusions, unchanged on the right and improved on the left with associated bibasilar opacities. Improved pulmonary edema. No new airspace disease. No pneumothorax. IMPRESSION: Cardiomegaly with persistent right pleural effusion and basilar opacity, likely atelectasis. Improved left pleural effusion from prior exam. Improved pulmonary edema. Electronically Signed   By: Jeb Levering M.D.   On: 11/23/2017 23:23    Procedures Procedures (including critical care time)  Medications Ordered in ED Medications - No data to display   Initial Impression /  Assessment and Plan / ED Course  I have reviewed the triage vital signs and the nursing notes.  Pertinent  labs & imaging results that were available during my care of the patient were reviewed by me and considered in my medical decision making (see chart for details).     Patient, with a past medical history of CKD on HD, CHF, CAD, diabetes, hypertension, A. fib on Coumadin, presents to ED for evaluation of 1 day history of he was unable to go to his dialysis session today due to his shortness of breath.  He also reports intermittent productive cough.  Symptoms began suddenly this morning.  He uses 3 L of oxygen while at home.  Patient has been seen and evaluated here in ED for similar symptoms in the past and ultimately needs hemodialysis to help with his symptoms.  He cannot do outpatient hemodialysis due to history of cardiac arrest during it.  On physical exam patient is constantly spitting up thick white mucus.  He is speaking complete sentences.  His BNP is elevated to 900s which is similar to his baseline.  INR is normal.  Troponin elevated 2.1.  EKG showing A. Fib.  Patient will ultimately need to be admitted for hemodialysis.  Spoke to Dr. Marval Regal, who will dialyze the patient in the morning.  They asked that we admit to hospitalist in the meantime.  Appreciate the help of hospitalist and nephrology for management of this patient.  Patient discussed with and seen by Dr. Vanita Panda.  Final Clinical Impressions(s) / ED Diagnoses   Final diagnoses:  None    ED Discharge Orders    None     Portions of this note were generated with Dragon dictation software. Dictation errors may occur despite best attempts at proofreading.    Delia Heady, PA-C 11/24/17 2800    Carmin Muskrat, MD 11/25/17 Shelah Lewandowsky

## 2017-11-23 NOTE — ED Triage Notes (Signed)
Per EMS, pt from home. Pt c/o cough and sob today - clear and diminished lung sounds per ems. Pt continuously spitting up during triage. Missed dialysis today d/t feeling too weak, last tx saturday. Denies pain.  Pt speaks laotian. Hx of Afib, CHF and CABG.  Was found HR Irregular tachy at 170, 6 and 12 adenosine a nd 5mg  metoprolol and 513mls NS given by ems. Pt brought to 80-120 hr. Hx of diabetes, EMS CBG 85. VS EMS 101.103F tympanic, SpO2 high 90s on 3L. Pt normally on 3L [place].

## 2017-11-23 NOTE — ED Notes (Addendum)
Pt given apple juice for low CBG.

## 2017-11-23 NOTE — ED Notes (Signed)
Patient transported to X-ray 

## 2017-11-23 NOTE — ED Notes (Signed)
Pt not tolerating nasal canula d/t continuously wiping nose. Pt placed on 3L simple mask.

## 2017-11-23 NOTE — ED Notes (Signed)
Troponin results given to Dr. Vanita Panda

## 2017-11-23 NOTE — ED Notes (Signed)
ED Provider at bedside. 

## 2017-11-24 ENCOUNTER — Other Ambulatory Visit (HOSPITAL_COMMUNITY): Payer: Medicare Other

## 2017-11-24 DIAGNOSIS — I132 Hypertensive heart and chronic kidney disease with heart failure and with stage 5 chronic kidney disease, or end stage renal disease: Secondary | ICD-10-CM | POA: Diagnosis present

## 2017-11-24 DIAGNOSIS — I4891 Unspecified atrial fibrillation: Secondary | ICD-10-CM

## 2017-11-24 DIAGNOSIS — K219 Gastro-esophageal reflux disease without esophagitis: Secondary | ICD-10-CM | POA: Diagnosis present

## 2017-11-24 DIAGNOSIS — M109 Gout, unspecified: Secondary | ICD-10-CM | POA: Diagnosis present

## 2017-11-24 DIAGNOSIS — R748 Abnormal levels of other serum enzymes: Secondary | ICD-10-CM | POA: Diagnosis not present

## 2017-11-24 DIAGNOSIS — R0600 Dyspnea, unspecified: Secondary | ICD-10-CM | POA: Diagnosis not present

## 2017-11-24 DIAGNOSIS — K7200 Acute and subacute hepatic failure without coma: Secondary | ICD-10-CM | POA: Diagnosis not present

## 2017-11-24 DIAGNOSIS — I420 Dilated cardiomyopathy: Secondary | ICD-10-CM | POA: Diagnosis present

## 2017-11-24 DIAGNOSIS — R000 Tachycardia, unspecified: Secondary | ICD-10-CM | POA: Diagnosis present

## 2017-11-24 DIAGNOSIS — E1122 Type 2 diabetes mellitus with diabetic chronic kidney disease: Secondary | ICD-10-CM | POA: Diagnosis present

## 2017-11-24 DIAGNOSIS — I5023 Acute on chronic systolic (congestive) heart failure: Secondary | ICD-10-CM | POA: Diagnosis not present

## 2017-11-24 DIAGNOSIS — E785 Hyperlipidemia, unspecified: Secondary | ICD-10-CM | POA: Diagnosis present

## 2017-11-24 DIAGNOSIS — R7989 Other specified abnormal findings of blood chemistry: Secondary | ICD-10-CM | POA: Diagnosis present

## 2017-11-24 DIAGNOSIS — I480 Paroxysmal atrial fibrillation: Secondary | ICD-10-CM | POA: Diagnosis present

## 2017-11-24 DIAGNOSIS — J9611 Chronic respiratory failure with hypoxia: Secondary | ICD-10-CM | POA: Diagnosis present

## 2017-11-24 DIAGNOSIS — J45909 Unspecified asthma, uncomplicated: Secondary | ICD-10-CM | POA: Diagnosis present

## 2017-11-24 DIAGNOSIS — N2581 Secondary hyperparathyroidism of renal origin: Secondary | ICD-10-CM | POA: Diagnosis present

## 2017-11-24 DIAGNOSIS — I10 Essential (primary) hypertension: Secondary | ICD-10-CM | POA: Diagnosis not present

## 2017-11-24 DIAGNOSIS — E875 Hyperkalemia: Secondary | ICD-10-CM | POA: Diagnosis not present

## 2017-11-24 DIAGNOSIS — D631 Anemia in chronic kidney disease: Secondary | ICD-10-CM | POA: Diagnosis present

## 2017-11-24 DIAGNOSIS — N186 End stage renal disease: Secondary | ICD-10-CM | POA: Diagnosis not present

## 2017-11-24 DIAGNOSIS — Z992 Dependence on renal dialysis: Secondary | ICD-10-CM

## 2017-11-24 DIAGNOSIS — I361 Nonrheumatic tricuspid (valve) insufficiency: Secondary | ICD-10-CM | POA: Diagnosis not present

## 2017-11-24 DIAGNOSIS — R778 Other specified abnormalities of plasma proteins: Secondary | ICD-10-CM | POA: Diagnosis present

## 2017-11-24 DIAGNOSIS — E8770 Fluid overload, unspecified: Secondary | ICD-10-CM | POA: Diagnosis not present

## 2017-11-24 DIAGNOSIS — I2510 Atherosclerotic heart disease of native coronary artery without angina pectoris: Secondary | ICD-10-CM | POA: Diagnosis present

## 2017-11-24 DIAGNOSIS — G4733 Obstructive sleep apnea (adult) (pediatric): Secondary | ICD-10-CM | POA: Diagnosis present

## 2017-11-24 DIAGNOSIS — D638 Anemia in other chronic diseases classified elsewhere: Secondary | ICD-10-CM | POA: Diagnosis not present

## 2017-11-24 DIAGNOSIS — I9589 Other hypotension: Secondary | ICD-10-CM | POA: Diagnosis present

## 2017-11-24 DIAGNOSIS — K7460 Unspecified cirrhosis of liver: Secondary | ICD-10-CM | POA: Diagnosis present

## 2017-11-24 DIAGNOSIS — I272 Pulmonary hypertension, unspecified: Secondary | ICD-10-CM | POA: Diagnosis present

## 2017-11-24 DIAGNOSIS — Z9981 Dependence on supplemental oxygen: Secondary | ICD-10-CM | POA: Diagnosis not present

## 2017-11-24 HISTORY — DX: Fluid overload, unspecified: E87.70

## 2017-11-24 HISTORY — DX: Hyperkalemia: E87.5

## 2017-11-24 HISTORY — DX: Other specified abnormalities of plasma proteins: R77.8

## 2017-11-24 HISTORY — DX: Other specified abnormal findings of blood chemistry: R79.89

## 2017-11-24 LAB — LIPID PANEL
Cholesterol: 85 mg/dL (ref 0–200)
HDL: 28 mg/dL — ABNORMAL LOW (ref >40)
LDL Cholesterol: 42 mg/dL (ref 0–99)
Total CHOL/HDL Ratio: 3 RATIO
Triglycerides: 76 mg/dL (ref <150)
VLDL: 15 mg/dL (ref 0–40)

## 2017-11-24 LAB — CBG MONITORING, ED
GLUCOSE-CAPILLARY: 77 mg/dL (ref 65–99)
GLUCOSE-CAPILLARY: 57 mg/dL — AB (ref 65–99)
GLUCOSE-CAPILLARY: 119 mg/dL — AB (ref 65–99)
GLUCOSE-CAPILLARY: 83 mg/dL (ref 65–99)
Glucose-Capillary: 68 mg/dL (ref 65–99)

## 2017-11-24 LAB — TROPONIN I
Troponin I: 0.08 ng/mL (ref <0.03)
Troponin I: 0.07 ng/mL (ref <0.03)
Troponin I: 0.05 ng/mL (ref <0.03)

## 2017-11-24 LAB — AMMONIA: Ammonia: 64 umol/L — ABNORMAL HIGH (ref 9–35)

## 2017-11-24 LAB — TSH: TSH: 4.347 u[IU]/mL (ref 0.350–4.500)

## 2017-11-24 LAB — HEMOGLOBIN A1C
HEMOGLOBIN A1C: 4.9 % (ref 4.8–5.6)
MEAN PLASMA GLUCOSE: 93.93 mg/dL

## 2017-11-24 LAB — HIV ANTIBODY (ROUTINE TESTING W REFLEX): HIV Screen 4th Generation wRfx: NONREACTIVE

## 2017-11-24 MED ORDER — WARFARIN SODIUM 5 MG PO TABS
5.0000 mg | ORAL_TABLET | Freq: Once | ORAL | Status: DC
  Filled 2017-11-24: qty 1

## 2017-11-24 MED ORDER — DOCUSATE SODIUM 100 MG PO CAPS
100.0000 mg | ORAL_CAPSULE | Freq: Two times a day (BID) | ORAL | Status: DC
  Administered 2017-11-24 – 2017-11-25 (×3): 100 mg via ORAL
  Filled 2017-11-24 (×4): qty 1

## 2017-11-24 MED ORDER — DICLOFENAC SODIUM 1 % TD GEL: 4.0000 g | Freq: Four times a day (QID) | TRANSDERMAL | Status: DC | PRN

## 2017-11-24 MED ORDER — CALAMINE EX LOTN
TOPICAL_LOTION | CUTANEOUS | Status: DC | PRN
  Filled 2017-11-24: qty 177

## 2017-11-24 MED ORDER — SODIUM CHLORIDE 0.9 % IV SOLN: 250.0000 mL | INTRAVENOUS | Status: DC | PRN

## 2017-11-24 MED ORDER — ATORVASTATIN CALCIUM 20 MG PO TABS
20.0000 mg | ORAL_TABLET | Freq: Every day | ORAL | Status: DC
  Administered 2017-11-24 – 2017-11-26 (×3): 20 mg via ORAL
  Filled 2017-11-24: qty 1
  Filled 2017-11-24: qty 2
  Filled 2017-11-24 (×2): qty 1

## 2017-11-24 MED ORDER — DEXTROSE 50 % IV SOLN
50.0000 mL | INTRAVENOUS | Status: DC | PRN
  Filled 2017-11-24: qty 50

## 2017-11-24 MED ORDER — METOPROLOL SUCCINATE ER 25 MG PO TB24
25.0000 mg | ORAL_TABLET | Freq: Two times a day (BID) | ORAL | Status: DC
  Administered 2017-11-24 – 2017-11-25 (×4): 25 mg via ORAL
  Filled 2017-11-24 (×6): qty 1

## 2017-11-24 MED ORDER — SODIUM POLYSTYRENE SULFONATE 15 GM/60ML PO SUSP
30.0000 g | Freq: Once | ORAL | Status: AC
  Administered 2017-11-24: 30 g via ORAL
  Filled 2017-11-24: qty 120

## 2017-11-24 MED ORDER — SODIUM CHLORIDE 0.9% FLUSH: 3.0000 mL | INTRAVENOUS | Status: DC | PRN

## 2017-11-24 MED ORDER — HYDROXYZINE HCL 25 MG PO TABS
25.0000 mg | ORAL_TABLET | Freq: Three times a day (TID) | ORAL | Status: DC | PRN
  Administered 2017-11-24 – 2017-11-26 (×2): 25 mg via ORAL
  Filled 2017-11-24 (×2): qty 1

## 2017-11-24 MED ORDER — ACETAMINOPHEN 325 MG PO TABS
650.0000 mg | ORAL_TABLET | Freq: Four times a day (QID) | ORAL | Status: DC
  Administered 2017-11-24 – 2017-11-25 (×4): 650 mg via ORAL
  Filled 2017-11-24 (×6): qty 2

## 2017-11-24 MED ORDER — ALLOPURINOL 300 MG PO TABS
150.0000 mg | ORAL_TABLET | Freq: Every day | ORAL | Status: DC
  Administered 2017-11-24 – 2017-11-26 (×3): 150 mg via ORAL
  Filled 2017-11-24 (×2): qty 1
  Filled 2017-11-24: qty 2
  Filled 2017-11-24: qty 0.5

## 2017-11-24 MED ORDER — WARFARIN - PHARMACIST DOSING INPATIENT
Freq: Every day | Status: DC
  Administered 2017-11-25 – 2017-11-26 (×2)

## 2017-11-24 MED ORDER — HYDROXYZINE HCL 10 MG PO TABS
10.0000 mg | ORAL_TABLET | Freq: Three times a day (TID) | ORAL | Status: DC | PRN
  Filled 2017-11-24: qty 1

## 2017-11-24 MED ORDER — FOLIC ACID 1 MG PO TABS
1.0000 mg | ORAL_TABLET | Freq: Every day | ORAL | Status: DC
  Administered 2017-11-24 – 2017-11-26 (×3): 1 mg via ORAL
  Filled 2017-11-24 (×3): qty 1

## 2017-11-24 MED ORDER — HYDROXYZINE HCL 25 MG PO TABS: 25.0000 mg | ORAL_TABLET | Freq: Three times a day (TID) | ORAL | Status: DC | PRN

## 2017-11-24 MED ORDER — ZOLPIDEM TARTRATE 5 MG PO TABS: 5.0000 mg | ORAL_TABLET | Freq: Every evening | ORAL | Status: DC | PRN

## 2017-11-24 MED ORDER — CALCIUM ACETATE (PHOS BINDER) 667 MG PO CAPS
667.0000 mg | ORAL_CAPSULE | Freq: Three times a day (TID) | ORAL | Status: DC
  Filled 2017-11-24: qty 1

## 2017-11-24 MED ORDER — LACTULOSE 10 GM/15ML PO SOLN
20.0000 g | Freq: Two times a day (BID) | ORAL | Status: DC
  Administered 2017-11-24 – 2017-11-26 (×5): 20 g via ORAL
  Filled 2017-11-24 (×6): qty 30

## 2017-11-24 MED ORDER — HYDRALAZINE HCL 20 MG/ML IJ SOLN
5.0000 mg | INTRAMUSCULAR | Status: DC | PRN
  Filled 2017-11-24: qty 1

## 2017-11-24 MED ORDER — SODIUM CHLORIDE 0.9% FLUSH
3.0000 mL | Freq: Two times a day (BID) | INTRAVENOUS | Status: DC
  Administered 2017-11-24 – 2017-11-25 (×4): 3 mL via INTRAVENOUS

## 2017-11-24 MED ORDER — CALCIUM ACETATE (PHOS BINDER) 667 MG PO CAPS
667.0000 mg | ORAL_CAPSULE | ORAL | Status: DC | PRN
  Filled 2017-11-24: qty 1

## 2017-11-24 MED ORDER — METOPROLOL TARTRATE 5 MG/5ML IV SOLN
5.0000 mg | Freq: Once | INTRAVENOUS | Status: AC
  Administered 2017-11-24: 5 mg via INTRAVENOUS
  Filled 2017-11-24: qty 5

## 2017-11-24 MED ORDER — COLCHICINE 0.6 MG PO TABS
0.3000 mg | ORAL_TABLET | ORAL | Status: DC
  Administered 2017-11-26: 0.3 mg via ORAL
  Filled 2017-11-24: qty 0.5

## 2017-11-24 MED ORDER — DEXTROSE 50 % IV SOLN
25.0000 mL | Freq: Once | INTRAVENOUS | Status: AC
  Administered 2017-11-24: 25 mL via INTRAVENOUS

## 2017-11-24 MED ORDER — TIZANIDINE HCL 2 MG PO TABS: 4.0000 mg | ORAL_TABLET | Freq: Three times a day (TID) | ORAL | Status: DC | PRN

## 2017-11-24 MED ORDER — PANTOPRAZOLE SODIUM 40 MG PO TBEC
40.0000 mg | DELAYED_RELEASE_TABLET | Freq: Every day | ORAL | Status: DC
  Administered 2017-11-24 – 2017-11-26 (×3): 40 mg via ORAL
  Filled 2017-11-24 (×3): qty 1

## 2017-11-24 MED ORDER — MORPHINE SULFATE (PF) 4 MG/ML IV SOLN: 1.0000 mg | INTRAVENOUS | Status: DC | PRN

## 2017-11-24 MED ORDER — NITROGLYCERIN 0.4 MG SL SUBL: 0.4000 mg | SUBLINGUAL_TABLET | SUBLINGUAL | Status: DC | PRN

## 2017-11-24 MED ORDER — CALCIUM ACETATE (PHOS BINDER) 667 MG PO CAPS
2001.0000 mg | ORAL_CAPSULE | Freq: Three times a day (TID) | ORAL | Status: DC
  Administered 2017-11-24 – 2017-11-26 (×7): 2001 mg via ORAL
  Filled 2017-11-24 (×12): qty 3

## 2017-11-24 NOTE — H&P (Signed)
History and Physical    Michael [name] ZOX:[id number] DOB: 07/07/1961 DOA: 11/23/2017  Referring MD/NP/PA:   PCP: Michael Morale, MD   Patient coming from:  The patient is coming from home.  At baseline, pt is independent for most of ADL.   Chief Complaint: Shortness of breath.  HPI: Michael [name] is a 57 y.o. male with medical history significant of CKD on dialysis (MWFS), sCHF, CAD, s/p of CABG, DM, hypertension, A. Fib on Coumadin, liver cirrhosis, hepatic encephalopathy, mitral valve replacement with porcine valve, s/p of tricuspid valve repair, anemia, hyperlipidemia, chronic respiratory failure on 3 L oxygen at home, who presents with shortness of breath.  Pt state that he started having SOB this morning. He also reports intermittent productive cough, with blood-tinged mucus. States that he was unable to go to his dialysis today due to weakness. Patient does not have fever or chills. He denies any chest pain. Patient does not have nausea, vomiting, diarrhea or abdominal pain. No unilateral weakness. Patient is mildly confused, but oriented 3. Per EMS, pt was found to have Irregular tachy at 170. Pt was given 6 and 12 mg of adenosine, and 5mg  metoprolol and 500 mls NS. His HR improved to 80-120 hr. Pt has AVF in left arm and tunnel catheter in right upper chest.  ED Course: pt was found to have troponin 0.10, INR 1.75, WBC 6.2, hemoglobin 9.9 which was 12.6 on 11/03/17, potassium of 5.6, bicarbonate 24, creatinine 4.56, BUN 22, temperature normal, oxygen saturation 94% on 3 L nasal cannula oxygen, soft blood pressure, tachypnea. Chest x-ray showed right pleural effusion and improved pulmonary edema, BNP 977. Patient is admitted to stepdown as inpatient.  Review of Systems:   General: no fevers, chills, has fatigue HEENT: no blurry vision, hearing changes or sore throat Respiratory: has dyspnea, coughing, no wheezing CV: no chest pain, no palpitations GI: no nausea, vomiting,  abdominal pain, diarrhea, constipation GU: no dysuria, burning on urination, increased urinary frequency, hematuria  Ext: no leg edema Neuro: no unilateral weakness, numbness, or tingling, no vision change or hearing loss. Has confusion. Skin: no rash, no skin tear. MSK: No muscle spasm, no deformity, no limitation of range of movement in spin Heme: No easy bruising.  Travel history: No recent long distant travel.  Allergy: No Known Allergies  Past Medical History:  Diagnosis Date  . Anemia of chronic disease   . Asthma    said he does not know  . Chronic kidney disease (CKD), stage IV (severe) ([place])   . Chronic systolic CHF (congestive heart failure) (HCC)    a. prior EF normal; b. Echo 10/16: Mild LVH, EF 30-35%, mitral valve bioprosthesis present without evidence of stenosis or regurgitation, severe LAE, mild RVE with mild to moderately reduced RVSF, moderate RAE  . Cirrhosis ([place])   . Coronary artery disease   . Diabetes mellitus type 2 in obese ([place])   . Diabetes mellitus without complication ([place])   . Dyspnea   . GERD (gastroesophageal reflux disease)   . H/O tricuspid valve repair 2012  . History of echocardiogram    Echo at [facility] in [facility] 4/12: mod LVH, EF 60-65%, mod LAE, tissue MVR ok, mod TR, mod pulmo HTN with RVSP 48 mmHg  . Hypertension   . Obstructive sleep apnea   . Paroxysmal atrial fibrillation (HCC)    s/p Maze procedure at time of MVR in 2011  . Pulmonary HTN ([place])   . 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  Thrombocytopenia ([place])     Past Surgical History:  Procedure Laterality Date  . AV FISTULA PLACEMENT Left 07/13/2017   Procedure: LEFT ARM ARTERIOVENOUS (AV) FISTULA CREATION;  Surgeon: Waynetta Sandy, MD;  Location: [place];  Service: Vascular;  Laterality: Left;  . BASCILIC VEIN TRANSPOSITION Left 11/03/2017   Procedure: BASILIC VEIN TRANSPOSITION SECOND STAGE;  Surgeon: Waynetta Sandy, MD;  Location: [place];   Service: Vascular;  Laterality: Left;  . CARDIAC SURGERY    . CARDIAC VALVE REPLACEMENT  2011  . CARDIOVERSION N/A 07/09/2017   Procedure: CARDIOVERSION;  Surgeon: Larey Dresser, MD;  Location: [facility] ENDOSCOPY;  Service: Cardiovascular;  Laterality: N/A;  . COLONOSCOPY N/A 07/16/2017   Procedure: COLONOSCOPY;  Surgeon: Jerene Bears, MD;  Location: [place];  Service: Gastroenterology;  Laterality: N/A;  . ESOPHAGOGASTRODUODENOSCOPY N/A 07/16/2017   Procedure: ESOPHAGOGASTRODUODENOSCOPY (EGD);  Surgeon: Jerene Bears, MD;  Location: [facility] ENDOSCOPY;  Service: Gastroenterology;  Laterality: N/A;  . INSERTION OF DIALYSIS CATHETER Right 07/13/2017   Procedure: INSERTION OF DIALYSIS CATHETER RIGHT INTERNAL JUGULAR;  Surgeon: Waynetta Sandy, MD;  Location: [place];  Service: Vascular;  Laterality: Right;  . RIGHT HEART CATH N/A 06/29/2017   Procedure: RIGHT HEART CATH;  Surgeon: Larey Dresser, MD;  Location: [place] CV LAB;  Service: Cardiovascular;  Laterality: N/A;  . RIGHT/LEFT HEART CATH AND CORONARY ANGIOGRAPHY N/A 08/19/2017   Procedure: RIGHT/LEFT HEART CATH AND CORONARY ANGIOGRAPHY;  Surgeon: Leonie Man, MD;  Location: [place] CV LAB;  Service: Cardiovascular;  Laterality: N/A;  . TEE WITHOUT CARDIOVERSION N/A 07/09/2017   Procedure: TRANSESOPHAGEAL ECHOCARDIOGRAM (TEE);  Surgeon: Larey Dresser, MD;  Location: [facility] ENDOSCOPY;  Service: Cardiovascular;  Laterality: N/A;    Social History:  reports that he has quit smoking. His smoking use included cigarettes. He smoked 1.00 pack per day. he has never used smokeless tobacco. He reports that he does not drink alcohol or use drugs.  Family History:  Family History  Problem Relation Age of Onset  . Heart attack Neg Hx      Prior to Admission medications   Medication Sig Start Date End Date Taking? Authorizing Provider  acetaminophen (TYLENOL) 325 MG tablet Take 650 mg by mouth every 6 (six) hours.     [provider]  acetaminophen-codeine (TYLENOL #3) 300-30 MG tablet Take 1 tablet by mouth every 6 (six) hours as needed for moderate pain. 11/19/17   Argentina Donovan, PA-C  allopurinol (ZYLOPRIM) 300 MG tablet Take 0.5 tablets (150 mg total) by mouth daily. 07/28/17   Shirley Friar, PA-C  atorvastatin (LIPITOR) 20 MG tablet Take 1 tablet (20 mg total) by mouth daily. 07/28/17   Shirley Friar, PA-C  calcium acetate (PHOSLO) 667 MG capsule Take 1 capsule (667 mg total) by mouth 3 (three) times daily with meals. Patient taking differently: Take 667-2,001 mg by mouth See admin instructions. Pt takes 2,001mg  by mouth three times daily and 667mg  twice daily with snacks 07/27/17   Shirley Friar, PA-C  colchicine 0.6 MG tablet Take 0.5 tablets (0.3 mg total) by mouth 2 (two) times a week. 09/21/17   Tawny Asal, MD  diclofenac sodium (VOLTAREN) 1 % GEL Apply 4 g topically 4 (four) times daily as needed (pain).     [provider]  docusate sodium (COLACE) 100 MG capsule Take 1 capsule (100 mg total) by mouth 2 (two) times daily. 07/27/17   Shirley Friar, PA-C  folic  acid (FOLVITE) 1 MG tablet TAKE 1 TABLET BY MOUTH DAILY. 11/12/17   Michael Morale, MD  hydrOXYzine (ATARAX/VISTARIL) 25 MG tablet 1/2-1 tablets tid prn itching 11/19/17   Argentina Donovan, PA-C  metoprolol succinate (TOPROL-XL) 25 MG 24 hr tablet Take 1 tablet (25 mg total) by mouth 2 (two) times daily. Patient taking differently: Take 25 mg by mouth daily.  07/27/17   Shirley Friar, PA-C  midodrine (PROAMATINE) 5 MG tablet Take 2 tablets (10 mg total) by mouth 3 (three) times daily with meals. 09/06/17   Kalman Shan Ratliff, DO  omeprazole (PRILOSEC) 20 MG capsule Take 1 capsule (20 mg total) by mouth daily. 03/03/17   Michael Morale, MD  OXYGEN Inhale 2 L into the lungs daily as needed.     [provider]  tiZANidine (ZANAFLEX) 4 MG tablet Take 1 tablet (4 mg total) by mouth  every 8 (eight) hours as needed for muscle spasms. 08/04/17   Michael Morale, MD  warfarin (COUMADIN) 2.5 MG tablet Take 1 tablet (2.5 mg total) by mouth daily at 6 PM. Or as directed by coumadin clinic Patient taking differently: Take 2.5-3.75 mg by mouth See admin instructions. Or as directed by coumadin clinic  3.75 mg (2.5 mg x 1.5) every Wed; 2.5 mg (2.5 mg x 1) all other days 10/15/17   Fay Records, MD    Physical Exam: Vitals:   11/24/17 0215 11/24/17 0245 11/24/17 0315 11/24/17 0345  BP: 106/86 111/76 107/81 107/78  Pulse: (!) 107  (!) 140 (!) 137  Resp: 15 (!) 23 (!) 22 17  Temp:      TempSrc:      SpO2: 100%  100% 100%  Weight:      Height:       General: Not in acute distress HEENT:       Eyes: PERRL, EOMI, no scleral icterus.       ENT: No discharge from the ears and nose, no pharynx injection, no tonsillar enlargement.        Neck: No JVD, no bruit, no mass felt. Heme: No neck lymph node enlargement. Cardiac: S1/S2, RRR, No murmurs, No gallops or rubs. Respiratory: deceased air movement bilaterally. No rales, wheezing, rhonchi or rubs. GI: Soft, nondistended, nontender, no rebound pain, no organomegaly, BS present. GU: No hematuria Ext: No pitting leg edema bilaterally. 2+DP/PT pulse bilaterally. Musculoskeletal: No joint deformities, No joint redness or warmth, no limitation of ROM in spin. Skin: No rashes.  Neuro: mildly confused, but still oriented X3, cranial nerves II-XII grossly intact, moves all extremities normally.  Psych: Patient is not psychotic, no suicidal or hemocidal ideation.  Labs on Admission: I have personally reviewed following labs and imaging studies  CBC: Recent Labs  Lab 11/23/17 2213  WBC 6.2  HGB 9.9*  HCT 29.8*  MCV 77.2*  PLT 283*   Basic Metabolic Panel: Recent Labs  Lab 11/23/17 2213  NA 131*  K 5.6*  CL 96*  CO2 24  GLUCOSE 68  BUN 22*  CREATININE 4.56*  CALCIUM 8.8*   GFR: Estimated Creatinine Clearance: 15.4  mL/min (A) (by C-G formula based on SCr of 4.56 mg/dL (H)). Liver Function Tests: No results for input(s): AST, ALT, ALKPHOS, BILITOT, PROT, ALBUMIN in the last 168 hours. No results for input(s): LIPASE, AMYLASE in the last 168 hours. No results for input(s): AMMONIA in the last 168 hours. Coagulation Profile: Recent Labs  Lab 11/23/17 2213  INR 1.75   Cardiac Enzymes: No  results for input(s): CKTOTAL, CKMB, CKMBINDEX, TROPONINI in the last 168 hours. BNP (last 3 results) Recent Labs    03/16/17 1401  PROBNP 6,530*   HbA1C: No results for input(s): HGBA1C in the last 72 hours. CBG: Recent Labs  Lab 11/23/17 2350 11/24/17 0153  GLUCAP 60* 77   Lipid Profile: No results for input(s): CHOL, HDL, LDLCALC, TRIG, CHOLHDL, LDLDIRECT in the last 72 hours. Thyroid Function Tests: No results for input(s): TSH, T4TOTAL, FREET4, T3FREE, THYROIDAB in the last 72 hours. Anemia Panel: No results for input(s): VITAMINB12, FOLATE, FERRITIN, TIBC, IRON, RETICCTPCT in the last 72 hours. Urine analysis:    Component Value Date/Time   COLORURINE AMBER (A) 07/25/2017 1806   APPEARANCEUR HAZY (A) 07/25/2017 1806   LABSPEC 1.018 07/25/2017 1806   PHURINE 5.0 07/25/2017 1806   GLUCOSEU NEGATIVE 07/25/2017 1806   HGBUR MODERATE (A) 07/25/2017 1806   BILIRUBINUR NEGATIVE 07/25/2017 1806   KETONESUR NEGATIVE 07/25/2017 1806   PROTEINUR 100 (A) 07/25/2017 1806   NITRITE NEGATIVE 07/25/2017 1806   LEUKOCYTESUR NEGATIVE 07/25/2017 1806   Sepsis Labs: @LABRCNTIP (procalcitonin:4,lacticidven:4) )No results found for this or any previous visit (from the past 240 hour(s)).   Radiological Exams on Admission: Dg Chest 2 View  Result Date: 11/23/2017 CLINICAL DATA:  Chest pain. EXAM: CHEST  2 VIEW COMPARISON:  09/18/2017 FINDINGS: Right-sided dialysis catheter in place. Post median sternotomy. Unchanged cardiomegaly. Bilateral pleural effusions, unchanged on the right and improved on the left with  associated bibasilar opacities. Improved pulmonary edema. No new airspace disease. No pneumothorax. IMPRESSION: Cardiomegaly with persistent right pleural effusion and basilar opacity, likely atelectasis. Improved left pleural effusion from prior exam. Improved pulmonary edema. Electronically Signed   By: Jeb Levering M.D.   On: 11/23/2017 23:23     EKG: Independently reviewed.  Atrial fibrillation with RVR, QTC 533, early R-wave progression   Assessment/Plan Principal Problem:   Volume overload Active Problems:   Type II diabetes mellitus with renal manifestations (HCC)   Essential hypertension   Gout   Atrial fibrillation with RVR (HCC)   Acute on chronic systolic CHF (congestive heart failure) (HCC)   Anemia, chronic disease   GERD (gastroesophageal reflux disease)   Acute hepatic encephalopathy   Liver cirrhosis (HCC)   HLD (hyperlipidemia)   ESRD (end stage renal disease) on dialysis (HCC)   CAD (coronary artery disease)   Hyperkalemia   Fluid overload   Elevated troponin   Volume overload due to ESRD and acute on chronic systolic CHF: Patient's shortness of breath is most likely due to fluid overload. 2-D echo on 08/13/17 showed EF of 50-55 percent. Renal, dr. Marval Regal was consulted for HD in AM.  -will admit to [facility] as inpt -renal will do HD in AM  Elevated trop and hx of CAD: trop 0.10. He has mild chronically elevated troponin, baseline 0.3-0.05. He denies any chest pain. Likely due to end-stage renal disease and possible demand ischemia secondary to fluid overload. -- cycle CE q6 x3 and repeat EKG in the am  - prn Nitroglycerin, Morphine, and lipitor  - Risk factor stratification: will check FLP and A1C  - 2d echo  Diet controlled type II diabetes mellitus with renal manifestations ([place]): Last A1c 5.2, well controled. Patient is not taking meds at home. Pt's blood sugar is low at 68. -check CBG q3h -prn D50  Essential hypertension: bp is soft -continue  metoprolol which is also for atrial fibrillation -IV hydralazine when necessary  Gout: -continue home allopurinol  Atrial fibrillation with RVR ([place]): HR has improved. Current HR is at 120s. CHA2DS2-VASc Score is 4, needs oral anticoagulation. Patient is on Coumadin at home. INR is 1.75 on admission.  -continue metoprolol. -If heart rate is not controlled, will start Cardizem drip -check TSH  Anemia, chronic disease: hgb dropped from 12.6 to 9.9. -will check FOBT  GERD: -Protonix  Liver cirrhosis and possible acute hepatic encephalopathy: pt is mildly confused, but still oriented x 3, likely due to hepatic encephalopathy. No focal neurological findings on physical examination. -will start lactulose 20 g twice a day empirically -Follow-up ammonia level -Frequent neuro check  HLD (hyperlipidemia): -Lipitor  ESRD (end stage renal disease) on dialysis (MWFS): potassium of 5.6, bicarbonate 24, creatinine 4.56, BUN 22, -renal, dr. Marval Regal was consulted for HD in AM.  Hyperkalemia: K 5.6. No T wave peaking. -give 30 g of Kayexalate now   DVT ppx: on Coumadin Code Status: Full code Family Communication: None at bed side.   Disposition Plan:  Anticipate discharge back to previous home environment Consults called:  Renal, Dr. Marval Regal Admission status:  SDU/inpation       Date of Service 11/24/2017    Ivor Costa [facility] Pager [phone]  If 7PM-7AM, please contact night-coverage [email or web address] Password [email or web address] 11/24/2017, 4:30 AM

## 2017-11-24 NOTE — ED Notes (Signed)
Pt has wakes up from frequent moaning to spit blood tinged creamy sputum; denies pain. RN helped him get phone to call daughter.

## 2017-11-24 NOTE — ED Notes (Signed)
Dr. Posey Pronto paged to [phone] [place], RN

## 2017-11-24 NOTE — ED Notes (Signed)
Pt not tolerating O2 simple mask after requesting pt to keep it on. Pt O2 saturations remaining at 97% without O2 on. Will continue to monitor.

## 2017-11-24 NOTE — Progress Notes (Signed)
[facility] PLAN OF CARE NOTE Patient: Michael [name] UXL:[id number]   PCP: [name], MD DOB: 11-05-1961   DOA: 11/23/2017   DOS: 11/24/2017    Patient was admitted by my colleague Dr. Blaine Hamper earlier on 11/24/2017. I have reviewed the H&P as well as assessment and plan and agree with the same. Important changes in the plan are listed below.  Plan of care: Principal Problem:   Volume overload Active Problems:   Type II diabetes mellitus with renal manifestations (HCC)   Essential hypertension   Gout   Atrial fibrillation with RVR (HCC)   Acute on chronic systolic CHF (congestive heart failure) (HCC)   Anemia, chronic disease   GERD (gastroesophageal reflux disease)   Acute hepatic encephalopathy   Liver cirrhosis (HCC)   HLD (hyperlipidemia)   ESRD (end stage renal disease) on dialysis (HCC)   CAD (coronary artery disease)   Hyperkalemia   Fluid overload   Elevated troponin Tachycardia sinus responded to IV Lopressor. Patient's primary issues volume overload will need hemodialysis will need also chest x-ray after hemodialysis. Initial plan was to go to stepdown, I feel that the patient can be managed on telemetry. Patient was kept n.p.o. for unknown reason most likely due to elevated troponin, advancing to renal diet right now.  Author: Berle Mull, MD [facility] Pager: [phone] 11/24/2017 2:20 PM   If 7PM-7AM, please contact night-coverage at [email or web address], password [facility]

## 2017-11-24 NOTE — ED Notes (Signed)
Admitting MD Niu at bedside.  

## 2017-11-24 NOTE — Progress Notes (Signed)
ANTICOAGULATION CONSULT NOTE - Initial Consult  Pharmacy Consult for Warfarin  Indication: atrial fibrillation  No Known Allergies  Patient Measurements: Height: 5\' 5"  (165.1 cm) Weight: 150 lb (68 kg) IBW/kg (Calculated) : 61.5  Vital Signs: Temp: 98.2 F (36.8 C) (01/07 2213) Temp Source: Oral (01/07 2213) BP: 107/78 (01/08 0345) Pulse Rate: 137 (01/08 0345)  Labs: Recent Labs    11/23/17 2213  HGB 9.9*  HCT 29.8*  PLT 144*  LABPROT 20.3*  INR 1.75  CREATININE 4.56*    Estimated Creatinine Clearance: 15.4 mL/min (A) (by C-G formula based on SCr of 4.56 mg/dL (H)).   Medical History: Past Medical History:  Diagnosis Date  . Anemia of chronic disease   . Asthma    said he does not know  . Chronic kidney disease (CKD), stage IV (severe) ([place])   . Chronic systolic CHF (congestive heart failure) (HCC)    a. prior EF normal; b. Echo 10/16: Mild LVH, EF 30-35%, mitral valve bioprosthesis present without evidence of stenosis or regurgitation, severe LAE, mild RVE with mild to moderately reduced RVSF, moderate RAE  . Cirrhosis ([place])   . Coronary artery disease   . Diabetes mellitus type 2 in obese ([place])   . Diabetes mellitus without complication ([place])   . Dyspnea   . GERD (gastroesophageal reflux disease)   . H/O tricuspid valve repair 2012  . History of echocardiogram    Echo at [facility] in [facility] 4/12: mod LVH, EF 60-65%, mod LAE, tissue MVR ok, mod TR, mod pulmo HTN with RVSP 48 mmHg  . Hypertension   . Obstructive sleep apnea   . Paroxysmal atrial fibrillation (HCC)    s/p Maze procedure at time of MVR in 2011  . Pulmonary HTN ([place])   . 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 Thrombocytopenia (HCC)    Assessment: 57 y/o M with cough and shortness of breath, on warfarin PTA for afib, INR is low at 1.75, Hgb 9.9.   Warfarin PTA dosing per outpatient anti-coag notes: 3.75 mg on Wed/Sat, 2.5 mg all other days  Goal of Therapy:  INR 2-3 Monitor  platelets by anticoagulation protocol: Yes   Plan:  Warfarin 5 mg PO x 1 at 1800 today Daily PT/INR Monitor for bleeding   Narda Bonds 11/24/2017,4:06 AM

## 2017-11-24 NOTE — ED Notes (Signed)
Pt oob to BSC. Call bell at side.

## 2017-11-24 NOTE — Consult Note (Signed)
[facility] Renal Consultation Note    Indication for Consultation:  Management of ESRD/hemodialysis, anemia, hypertension/volume, and secondary hyperparathyroidism. PCP:  HPI: Michael [name] is a 57 y.o. male with ESRD, Type 2 DM, p A-fib (on warfarin), Hx MVR, TV repair, and MAZE procedure, CAD, Hx cardiac arrest on HD, dilated cardiomyopathy (EF50-55%), thrombocytopenia, and OSA who is being admitted with dyspnea/volume overload.  Pt reports coughing up mucus and increase dyspnea for the past 2-3 days. Denies CP or abdominal pain, N/V/D, fever or chills. Presented to the ED overnight. Found to be hypoxic, requiring nasal oxygen 3L/min. Labs showed K 5.6, Trop 0.08, WBC 6.2, Hgb 9.9. CXR moderate R pleural effusion. We were consulted for volume correction/HD.  Usually dialyzes at [facility] on 4d/ week sched (MWFS). He missed his HD on Monday, last HD was Sat 1/5 which he completed in entirety and actually left 0.5kg under his EDW. Has been using TDC as access as LUE AVF matures.   Past Medical History:  Diagnosis Date  . Anemia of chronic disease   . Asthma    said he does not know  . Chronic kidney disease (CKD), stage IV (severe) ([place])   . Chronic systolic CHF (congestive heart failure) (HCC)    a. prior EF normal; b. Echo 10/16: Mild LVH, EF 30-35%, mitral valve bioprosthesis present without evidence of stenosis or regurgitation, severe LAE, mild RVE with mild to moderately reduced RVSF, moderate RAE  . Cirrhosis ([place])   . Coronary artery disease   . Diabetes mellitus type 2 in obese ([place])   . Diabetes mellitus without complication ([place])   . Dyspnea   . GERD (gastroesophageal reflux disease)   . H/O tricuspid valve repair 2012  . History of echocardiogram    Echo at [facility] in [facility] 4/12: mod LVH, EF 60-65%, mod LAE, tissue MVR ok, mod TR, mod pulmo HTN with RVSP 48 mmHg  . Hypertension   . Obstructive sleep apnea   . Paroxysmal atrial fibrillation (HCC)    s/p  Maze procedure at time of MVR in 2011  . Pulmonary HTN ([place])   . 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 Thrombocytopenia ([place])    Past Surgical History:  Procedure Laterality Date  . AV FISTULA PLACEMENT Left 07/13/2017   Procedure: LEFT ARM ARTERIOVENOUS (AV) FISTULA CREATION;  Surgeon: Waynetta Sandy, MD;  Location: [place];  Service: Vascular;  Laterality: Left;  . BASCILIC VEIN TRANSPOSITION Left 11/03/2017   Procedure: BASILIC VEIN TRANSPOSITION SECOND STAGE;  Surgeon: Waynetta Sandy, MD;  Location: [place];  Service: Vascular;  Laterality: Left;  . CARDIAC SURGERY    . CARDIAC VALVE REPLACEMENT  2011  . CARDIOVERSION N/A 07/09/2017   Procedure: CARDIOVERSION;  Surgeon: Larey Dresser, MD;  Location: [facility] ENDOSCOPY;  Service: Cardiovascular;  Laterality: N/A;  . COLONOSCOPY N/A 07/16/2017   Procedure: COLONOSCOPY;  Surgeon: Jerene Bears, MD;  Location: [place];  Service: Gastroenterology;  Laterality: N/A;  . ESOPHAGOGASTRODUODENOSCOPY N/A 07/16/2017   Procedure: ESOPHAGOGASTRODUODENOSCOPY (EGD);  Surgeon: Jerene Bears, MD;  Location: [facility] ENDOSCOPY;  Service: Gastroenterology;  Laterality: N/A;  . INSERTION OF DIALYSIS CATHETER Right 07/13/2017   Procedure: INSERTION OF DIALYSIS CATHETER RIGHT INTERNAL JUGULAR;  Surgeon: Waynetta Sandy, MD;  Location: [place];  Service: Vascular;  Laterality: Right;  . RIGHT HEART CATH N/A 06/29/2017   Procedure: RIGHT HEART CATH;  Surgeon: Larey Dresser, MD;  Location: [place] CV LAB;  Service: Cardiovascular;  Laterality: N/A;  . RIGHT/LEFT HEART CATH AND CORONARY ANGIOGRAPHY N/A 08/19/2017   Procedure: RIGHT/LEFT HEART CATH AND CORONARY ANGIOGRAPHY;  Surgeon: Leonie Man, MD;  Location: [place] CV LAB;  Service: Cardiovascular;  Laterality: N/A;  . TEE WITHOUT CARDIOVERSION N/A 07/09/2017   Procedure: TRANSESOPHAGEAL ECHOCARDIOGRAM (TEE);  Surgeon: Larey Dresser, MD;  Location: [facility]  ENDOSCOPY;  Service: Cardiovascular;  Laterality: N/A;   Family History  Problem Relation Age of Onset  . Heart attack Neg Hx    Social History:  reports that he has quit smoking. His smoking use included cigarettes. He smoked 1.00 pack per day. he has never used smokeless tobacco. He reports that he does not drink alcohol or use drugs.  ROS: As per HPI otherwise negative.  Physical Exam: Vitals:   11/24/17 0719 11/24/17 0815 11/24/17 0906 11/24/17 0932  BP: 104/78 115/84  102/70  Pulse:  (!) 149 (!) 46 (!) 115  Resp: 20 (!) 29 19 (!) 26  Temp:      TempSrc:      SpO2:  99% 100% 98%  Weight:      Height:         General: Well developed, well nourished, in no acute distress. On nasal oxygen. Head: Normocephalic, atraumatic, sclera non-icteric, mucus membranes are moist. Neck: Supple without lymphadenopathy/masses.  Lungs: Clear bilaterally to auscultation in upper lobes, no breath sounds in RLL.  Heart: RRR with normal S1, S2. No murmurs, rubs, or gallops appreciated. Abdomen: Soft, non-tender, non-distended with normoactive bowel sounds. No rebound/guarding. No obvious abdominal masses. Musculoskeletal:  Strength and tone appear normal for age. Lower extremities: No edema or ischemic changes, no open wounds. Neuro: Alert and oriented X 3. Moves all extremities spontaneously. Psych:  Responds to questions appropriately with a normal affect. Dialysis Access: TDC in R chest (skin irritation, no drainage), LUE AVF + bruit with heavy bruising present down entire arm.  No Known Allergies Prior to Admission medications   Medication Sig Start Date End Date Taking? Authorizing Provider  oxycodone (OXY-IR) 5 MG capsule Take 5 mg by mouth every 8 (eight) hours as needed for pain.   Yes [provider]  acetaminophen (TYLENOL) 325 MG tablet Take 650 mg by mouth every 6 (six) hours.     [provider]  acetaminophen-codeine (TYLENOL #3) 300-30 MG tablet Take 1 tablet by  mouth every 6 (six) hours as needed for moderate pain. 11/19/17   Argentina Donovan, PA-C  allopurinol (ZYLOPRIM) 300 MG tablet Take 0.5 tablets (150 mg total) by mouth daily. 07/28/17   Shirley Friar, PA-C  atorvastatin (LIPITOR) 20 MG tablet Take 1 tablet (20 mg total) by mouth daily. 07/28/17   Shirley Friar, PA-C  calcium acetate (PHOSLO) 667 MG capsule Take 1 capsule (667 mg total) by mouth 3 (three) times daily with meals. Patient taking differently: Take 667-2,001 mg by mouth See admin instructions. Pt takes 2,001mg  by mouth three times daily and 667mg  twice daily with snacks 07/27/17   Shirley Friar, PA-C  colchicine 0.6 MG tablet Take 0.5 tablets (0.3 mg total) by mouth 2 (two) times a week. 09/21/17   Tawny Asal, MD  diclofenac sodium (VOLTAREN) 1 % GEL Apply 4 g topically 4 (four) times daily as needed (pain).     [provider]  docusate sodium (COLACE) 100 MG capsule Take 1 capsule (100 mg total) by mouth 2 (two) times daily. 07/27/17   Shirley Friar, PA-C  folic acid (FOLVITE)  1 MG tablet TAKE 1 TABLET BY MOUTH DAILY. 11/12/17   [name], MD  hydrOXYzine (ATARAX/VISTARIL) 25 MG tablet 1/2-1 tablets tid prn itching Patient taking differently: Take 25 mg by mouth 2 (two) times daily as needed for itching. 1/2-1 tablets tid prn itching 11/19/17   Argentina Donovan, PA-C  metoprolol succinate (TOPROL-XL) 25 MG 24 hr tablet Take 1 tablet (25 mg total) by mouth 2 (two) times daily. Patient taking differently: Take 25 mg by mouth 2 (two) times daily.  07/27/17   Shirley Friar, PA-C  midodrine (PROAMATINE) 5 MG tablet Take 2 tablets (10 mg total) by mouth 3 (three) times daily with meals. 09/06/17   Kalman Shan Ratliff, DO  omeprazole (PRILOSEC) 20 MG capsule Take 1 capsule (20 mg total) by mouth daily. 03/03/17   [name], MD  OXYGEN Inhale 2 L into the lungs daily as needed.     [provider]  tiZANidine (ZANAFLEX)  4 MG tablet Take 1 tablet (4 mg total) by mouth every 8 (eight) hours as needed for muscle spasms. 08/04/17   [name], MD  warfarin (COUMADIN) 2.5 MG tablet Take 1 tablet (2.5 mg total) by mouth daily at 6 PM. Or as directed by coumadin clinic Patient taking differently: Take 2.5-3.75 mg by mouth See admin instructions. Or as directed by coumadin clinic  3.75 mg (2.5 mg x 1.5) every Wed; 2.5 mg (2.5 mg x 1) all other days 10/15/17   Fay Records, MD   Current Facility-Administered Medications  Medication Dose Route Frequency Provider Last Rate Last Dose  . 0.9 %  sodium chloride infusion  250 mL Intravenous PRN Ivor Costa, MD      . acetaminophen (TYLENOL) tablet 650 mg  650 mg Oral Q6H Ivor Costa, MD   650 mg at 11/24/17 0557  . allopurinol (ZYLOPRIM) tablet 150 mg  150 mg Oral Daily Ivor Costa, MD      . atorvastatin (LIPITOR) tablet 20 mg  20 mg Oral Daily Ivor Costa, MD      . calcium acetate (PHOSLO) capsule 2,001 mg  2,001 mg Oral TID WC Ivor Costa, MD   2,001 mg at 11/24/17 0905  . calcium acetate (PHOSLO) capsule 667 mg  667 mg Oral PRN Ivor Costa, MD      . Derrill Memo ON 11/26/2017] colchicine tablet 0.3 mg  0.3 mg Oral Once per day on Mon Thu Niu, Xilin, MD      . dextrose 50 % solution 50 mL  50 mL Intravenous PRN Lavina Hamman, MD      . diclofenac sodium (VOLTAREN) 1 % transdermal gel 4 g  4 g Topical QID PRN Ivor Costa, MD      . docusate sodium (COLACE) capsule 100 mg  100 mg Oral BID Ivor Costa, MD      . folic acid (FOLVITE) tablet 1 mg  1 mg Oral Daily Ivor Costa, MD      . hydrALAZINE (APRESOLINE) injection 5 mg  5 mg Intravenous Q2H PRN Ivor Costa, MD      . hydrOXYzine (ATARAX/VISTARIL) tablet 10 mg  10 mg Oral TID PRN Ivor Costa, MD      . lactulose ([place]) 10 GM/15ML solution 20 g  20 g Oral BID Ivor Costa, MD      . metoprolol succinate (TOPROL-XL) 24 hr tablet 25 mg  25 mg Oral BID Ivor Costa, MD   25 mg at 11/24/17 0555  . morphine 4 MG/ML injection 1  mg  1 mg  Intravenous Q4H PRN Ivor Costa, MD      . nitroGLYCERIN (NITROSTAT) SL tablet 0.4 mg  0.4 mg Sublingual Q5 min PRN Ivor Costa, MD      . pantoprazole (PROTONIX) EC tablet 40 mg  40 mg Oral Daily Ivor Costa, MD      . sodium chloride flush (NS) 0.9 % injection 3 mL  3 mL Intravenous Q12H Ivor Costa, MD      . sodium chloride flush (NS) 0.9 % injection 3 mL  3 mL Intravenous PRN Ivor Costa, MD      . tiZANidine (ZANAFLEX) tablet 4 mg  4 mg Oral Q8H PRN Ivor Costa, MD      . warfarin (COUMADIN) tablet 5 mg  5 mg Oral ONCE-1800 Erenest Blank, RPH      . Warfarin - Pharmacist Dosing Inpatient   Does not apply q1800 Erenest Blank, RPH      . zolpidem (AMBIEN) tablet 5 mg  5 mg Oral QHS PRN Ivor Costa, MD       Current Outpatient Medications  Medication Sig Dispense Refill  . oxycodone (OXY-IR) 5 MG capsule Take 5 mg by mouth every 8 (eight) hours as needed for pain.    Marland Kitchen acetaminophen (TYLENOL) 325 MG tablet Take 650 mg by mouth every 6 (six) hours.     Marland Kitchen acetaminophen-codeine (TYLENOL #3) 300-30 MG tablet Take 1 tablet by mouth every 6 (six) hours as needed for moderate pain. 20 tablet 0  . allopurinol (ZYLOPRIM) 300 MG tablet Take 0.5 tablets (150 mg total) by mouth daily. 15 tablet 3  . atorvastatin (LIPITOR) 20 MG tablet Take 1 tablet (20 mg total) by mouth daily. 30 tablet 3  . calcium acetate (PHOSLO) 667 MG capsule Take 1 capsule (667 mg total) by mouth 3 (three) times daily with meals. (Patient taking differently: Take 667-2,001 mg by mouth See admin instructions. Pt takes 2,001mg  by mouth three times daily and 667mg  twice daily with snacks) 90 capsule 3  . colchicine 0.6 MG tablet Take 0.5 tablets (0.3 mg total) by mouth 2 (two) times a week. 30 tablet 0  . diclofenac sodium (VOLTAREN) 1 % GEL Apply 4 g topically 4 (four) times daily as needed (pain).     Marland Kitchen docusate sodium (COLACE) 100 MG capsule Take 1 capsule (100 mg total) by mouth 2 (two) times daily. 10 capsule 0  . folic acid  (FOLVITE) 1 MG tablet TAKE 1 TABLET BY MOUTH DAILY. 30 tablet 2  . hydrOXYzine (ATARAX/VISTARIL) 25 MG tablet 1/2-1 tablets tid prn itching (Patient taking differently: Take 25 mg by mouth 2 (two) times daily as needed for itching. 1/2-1 tablets tid prn itching) 30 tablet 0  . metoprolol succinate (TOPROL-XL) 25 MG 24 hr tablet Take 1 tablet (25 mg total) by mouth 2 (two) times daily. (Patient taking differently: Take 25 mg by mouth 2 (two) times daily. ) 60 tablet 6  . midodrine (PROAMATINE) 5 MG tablet Take 2 tablets (10 mg total) by mouth 3 (three) times daily with meals. 90 tablet 2  . omeprazole (PRILOSEC) 20 MG capsule Take 1 capsule (20 mg total) by mouth daily. 30 capsule 5  . OXYGEN Inhale 2 L into the lungs daily as needed.     Marland Kitchen tiZANidine (ZANAFLEX) 4 MG tablet Take 1 tablet (4 mg total) by mouth every 8 (eight) hours as needed for muscle spasms. 60 tablet 1  . warfarin (COUMADIN) 2.5 MG tablet Take  1 tablet (2.5 mg total) by mouth daily at 6 PM. Or as directed by coumadin clinic (Patient taking differently: Take 2.5-3.75 mg by mouth See admin instructions. Or as directed by coumadin clinic  3.75 mg (2.5 mg x 1.5) every Wed; 2.5 mg (2.5 mg x 1) all other days) 30 tablet 1   Labs: Basic Metabolic Panel: Recent Labs  Lab 11/23/17 2213  NA 131*  K 5.6*  CL 96*  CO2 24  GLUCOSE 68  BUN 22*  CREATININE 4.56*  CALCIUM 8.8*   Recent Labs  Lab 11/24/17 0521  AMMONIA 64*   CBC: Recent Labs  Lab 11/23/17 2213  WBC 6.2  HGB 9.9*  HCT 29.8*  MCV 77.2*  PLT 144*   Cardiac Enzymes: Recent Labs  Lab 11/24/17 0521  TROPONINI 0.08*   CBG: Recent Labs  Lab 11/23/17 2350 11/24/17 0153 11/24/17 0653 11/24/17 1010 11/24/17 1055  GLUCAP 60* 77 68 57* 119*   Studies/Results: Dg Chest 2 View  Result Date: 11/23/2017 CLINICAL DATA:  Chest pain. EXAM: CHEST  2 VIEW COMPARISON:  09/18/2017 FINDINGS: Right-sided dialysis catheter in place. Post median sternotomy. Unchanged  cardiomegaly. Bilateral pleural effusions, unchanged on the right and improved on the left with associated bibasilar opacities. Improved pulmonary edema. No new airspace disease. No pneumothorax. IMPRESSION: Cardiomegaly with persistent right pleural effusion and basilar opacity, likely atelectasis. Improved left pleural effusion from prior exam. Improved pulmonary edema. Electronically Signed   By: Jeb Levering M.D.   On: 11/23/2017 23:23    Dialysis Orders:  MWFSa at [facility] 4hr, 300/800, EDW 68.5kg, 3K/2.25Ca bath, TDC, no heparin - Mircera 64mcg IV q 2 weeks (last given 11/18/17) - No VDRA  Assessment/Plan: 1.  Dyspnea/volume overload: For HD today for volume correction, suspect has lost real body weight and needs EDW lowered. ?Consider thoracentesis of R effusion. 2.  ESRD: Missed last HD, usually MWFSa schedule. HD today, then back to usual. 3.  BP/volume: Chronic hyPOtension, on midodrine 10mg  TID. UF as tolerated. Will need lower EDW on discharge. 4.  Anemia of CKD: Hgb 9.9. Monitor for now. No ESA yet. 5.  Metabolic bone disease: Ca ok, Phos pending. Follow labs. 6.  CAD/dilated cardiomyopathy: Hx cardiac arrest on HD. Follow. 7.  DM: Per primary. 8. A-fib (on warfarin): Per primary.  Veneta Penton, PA-C 11/24/2017, 11:45 AM  [facility] Pager: [phone]  Pt seen, examined and agree w A/P as above.  Kelly Splinter MD [employer] pager [phone]   11/24/2017, 3:04 PM

## 2017-11-24 NOTE — ED Notes (Signed)
Dr. Posey Pronto contacted and aware that troponin down to 0.07. Informed that pt c/o severe itching and has hourly watery stools. States he will order calamine lotion and atarax. states to hold lactulose if watery stools continue. Same reported to [place], [place]

## 2017-11-24 NOTE — ED Notes (Signed)
RN called dialysis to get dispo. They said they need dialysis orders.

## 2017-11-24 NOTE — ED Notes (Signed)
Pt returns to bed after bedside commode. Pt denies pain but c/o itching will address with reprt to nurse.

## 2017-11-24 NOTE — ED Notes (Signed)
Dressing applied at right shoulder sizwe if nickle where pt has excoriated skin by scratchiung.

## 2017-11-24 NOTE — ED Notes (Signed)
Re-paged nephrology 3x

## 2017-11-24 NOTE — ED Notes (Addendum)
Pt used bedside commode upon arrival. He produced extremely loose stool. He ambulated with difficulty, but needed the most help standing himself up and wiping. He seemed steady once he was standing. After he was done, he was given his meal tray. Pt is eating now.  Pt has not made any verbal complaints but is audibly groaning from his room and grinding his teeth loudly.

## 2017-11-25 ENCOUNTER — Inpatient Hospital Stay (HOSPITAL_COMMUNITY): Payer: Medicare Other

## 2017-11-25 ENCOUNTER — Other Ambulatory Visit (HOSPITAL_COMMUNITY): Payer: Medicare Other

## 2017-11-25 ENCOUNTER — Encounter (HOSPITAL_COMMUNITY): Payer: Self-pay | Admitting: *Deleted

## 2017-11-25 ENCOUNTER — Other Ambulatory Visit: Payer: Self-pay

## 2017-11-25 DIAGNOSIS — I361 Nonrheumatic tricuspid (valve) insufficiency: Secondary | ICD-10-CM

## 2017-11-25 LAB — CBC
HCT: 27.9 % — ABNORMAL LOW (ref 39.0–52.0)
Hemoglobin: 9.1 g/dL — ABNORMAL LOW (ref 13.0–17.0)
MCH: 25.1 pg — ABNORMAL LOW (ref 26.0–34.0)
MCHC: 32.6 g/dL (ref 30.0–36.0)
MCV: 76.9 fL — ABNORMAL LOW (ref 78.0–100.0)
Platelets: 117 10*3/uL — ABNORMAL LOW (ref 150–400)
RBC: 3.63 MIL/uL — ABNORMAL LOW (ref 4.22–5.81)
RDW: 18 % — AB (ref 11.5–15.5)
WBC: 7.6 10*3/uL (ref 4.0–10.5)

## 2017-11-25 LAB — RENAL FUNCTION PANEL
ALBUMIN: 2.7 g/dL — AB (ref 3.5–5.0)
Anion gap: 8 (ref 5–15)
BUN: 12 mg/dL (ref 6–20)
CALCIUM: 8.7 mg/dL — AB (ref 8.9–10.3)
CO2: 28 mmol/L (ref 22–32)
Chloride: 97 mmol/L — ABNORMAL LOW (ref 101–111)
Creatinine, Ser: 3.87 mg/dL — ABNORMAL HIGH (ref 0.61–1.24)
GFR calc Af Amer: 18 mL/min — ABNORMAL LOW (ref >60)
GFR calc non Af Amer: 16 mL/min — ABNORMAL LOW (ref >60)
GLUCOSE: 151 mg/dL — AB (ref 65–99)
PHOSPHORUS: 3.2 mg/dL (ref 2.5–4.6)
Potassium: 3.2 mmol/L — ABNORMAL LOW (ref 3.5–5.1)
Sodium: 133 mmol/L — ABNORMAL LOW (ref 135–145)

## 2017-11-25 LAB — PROTIME-INR
INR: 1.91
Prothrombin Time: 21.7 seconds — ABNORMAL HIGH (ref 11.4–15.2)

## 2017-11-25 LAB — ECHOCARDIOGRAM COMPLETE
HEIGHTINCHES: 65 in
Weight: 2151.69 oz

## 2017-11-25 LAB — MRSA PCR SCREENING: MRSA by PCR: NEGATIVE

## 2017-11-25 LAB — HEPATITIS B SURFACE ANTIGEN: HEP B S AG: NEGATIVE

## 2017-11-25 MED ORDER — MIDODRINE HCL 5 MG PO TABS
ORAL_TABLET | ORAL | Status: AC
  Filled 2017-11-25: qty 2

## 2017-11-25 MED ORDER — WARFARIN SODIUM 5 MG PO TABS
5.0000 mg | ORAL_TABLET | Freq: Once | ORAL | Status: AC
  Administered 2017-11-25: 5 mg via ORAL
  Filled 2017-11-25 (×2): qty 1

## 2017-11-25 MED ORDER — BENZONATATE 100 MG PO CAPS
100.0000 mg | ORAL_CAPSULE | Freq: Three times a day (TID) | ORAL | Status: DC
  Administered 2017-11-25 – 2017-11-26 (×4): 100 mg via ORAL
  Filled 2017-11-25 (×4): qty 1

## 2017-11-25 MED ORDER — MIDODRINE HCL 5 MG PO TABS
10.0000 mg | ORAL_TABLET | ORAL | Status: DC
  Administered 2017-11-25: 10 mg via ORAL

## 2017-11-25 MED ORDER — POTASSIUM CHLORIDE CRYS ER 20 MEQ PO TBCR
20.0000 meq | EXTENDED_RELEASE_TABLET | Freq: Once | ORAL | Status: AC
  Administered 2017-11-25: 20 meq via ORAL
  Filled 2017-11-25: qty 1

## 2017-11-25 NOTE — Progress Notes (Addendum)
ANTICOAGULATION CONSULT NOTE  Pharmacy Consult for Warfarin  Indication: atrial fibrillation  No Known Allergies  Patient Measurements: Height: 5\' 5"  (165.1 cm) Weight: 137 lb 9.1 oz (62.4 kg)(Standing Scale) IBW/kg (Calculated) : 61.5  Vital Signs: Temp: 98.2 F (36.8 C) (01/09 0800) Temp Source: Oral (01/09 0800) BP: 74/44 (01/09 1030) Pulse Rate: 91 (01/09 1030)  Labs: Recent Labs    11/23/17 2213 11/24/17 0521 11/24/17 1157 11/24/17 2119 11/25/17 0500 11/25/17 0815  HGB 9.9*  --   --   --  9.1*  --   HCT 29.8*  --   --   --  27.9*  --   PLT 144*  --   --   --  117*  --   LABPROT 20.3*  --   --   --  21.7*  --   INR 1.75  --   --   --  1.91  --   CREATININE 4.56*  --   --   --   --  3.87*  TROPONINI  --  0.08* 0.07* 0.05*  --   --     Estimated Creatinine Clearance: 18.1 mL/min (A) (by C-G formula based on SCr of 3.87 mg/dL (H)).   Assessment: 57 y/o M with cough and shortness of breath, on warfarin PTA for afib and porcine MVR. Warfarin PTA dosing per outpatient anti-coag notes: 3.75 mg on Wed/Sat, 2.5 mg all other days.  Noted patient has been contacted by [facility] about assistance with pillbox (friend fills the box for patient and he has refused bubble-packing services).  INR low on admission at 1.75, still below goal but rose to 1.91. Warfarin dose was ordered, but NOT given last evening.  Hgb and plts low, but stable- no bleeding noted.  Goal of Therapy:  INR 2-3 Monitor platelets by anticoagulation protocol: Yes   Plan:  Warfarin 5 mg PO x 1 tonight  Daily INR Monitor for bleeding  Jaliah Foody D. Diannah Rindfleisch, PharmD, BCPS Clinical Pharmacist Clinical Phone for 11/25/2017 until 3:30pm: [phone] If after 3:30pm, please call main pharmacy at [phone] 11/25/2017 10:55 AM

## 2017-11-25 NOTE — Progress Notes (Signed)
PROGRESS NOTE    Michael [name]  DEY:[id number] DOB: February 08, 1961 DOA: 11/23/2017 PCP: [name], MD   Brief Narrative: Patient is a 57 year old male with medical history significant of CKD on dialysis (MWFS), sCHF, CAD, s/p of CABG, DM, hypertension, A. Fibon Coumadin, liver cirrhosis, hepatic encephalopathy, mitral valve replacement with porcine valve, s/p of tricuspid valve repair, anemia, hyperlipidemia, chronic respiratory failure on 3 L oxygen at home, who presents with shortness of breath. Patient was found to have following volume overload.  Nephrology was consulted and he underwent dialysis today.     Assessment & Plan:   Principal Problem:   Volume overload Active Problems:   Type II diabetes mellitus with renal manifestations (HCC)   Essential hypertension   Gout   Atrial fibrillation with RVR (HCC)   Acute on chronic systolic CHF (congestive heart failure) (HCC)   Anemia, chronic disease   GERD (gastroesophageal reflux disease)   Acute hepatic encephalopathy   Liver cirrhosis (HCC)   HLD (hyperlipidemia)   ESRD (end stage renal disease) on dialysis (HCC)   CAD (coronary artery disease)   Hyperkalemia   Fluid overload   Elevated troponin  Volume overload due to ESRD and acute on chronic systolic CHF: Nephrology is following.  Patient underwent dialysis today.  Elevated trop and hx of CAD: trop 0.10. He has mild chronically elevated troponin, baseline 0.3-0.05. He denies any chest pain. Likely due to end-stage renal disease and possible demand ischemia secondary to fluid overload.  Diet controlled type II diabetes mellitus with renal manifestations [facility]): Last A1c 5.2, well controled. Patient is not taking meds at home.  We will continue to monitor his blood sugars  Essential hypertension: bp is soft -continue metoprolol which is also for atrial fibrillation  Gout: -continue home allopurinol  Atrial fibrillation with RVR ([place]): HR has improved.   CHA2DS2-VASc Score is 4,Patient is on Coumadin at home. INR is 1.75 on admission.  -continue metoprolol.   Anemia, chronic disease: Likely associated with chronic kidney disease  GERD:  Continue Protonix  Liver cirrhosis and possible acute hepatic encephalopathy: pt is mildly confused, but still oriented x 3, likely due to hepatic encephalopathy. No focal neurological findings on physical examination. -Started on lactulose 20 g twice a day empirically -Elevated  ammonia level.  We will check an ammonia level tomorrow -Frequent neuro check  HLD (hyperlipidemia): -Lipitor   Hyperkalemia:Corrected . -given 30 g of Kayexalate now  Patient was planned for discharge after dialysis today.  But his blood pressure dropped.  Patient was also mildly confused.  We are planning to monitor him for at least a night.  Likely discharge tomorrow.    DVT prophylaxis:Warfarin Code Status: Full Family Communication: None present at the bedside Disposition Plan: Tomorrow to home   Consultants: Nephrology  Procedures: Dialysis  Antimicrobials: None  Subjective: Patient seen and examined at the bedside this afternoon.  He was eating his lunch after dialysis.  Discharge plan was explained to him. At around 3 PM, he was noted to be mildly hypotensive and confused.  Patient rechecked.  He was lying on the bed.  He said he was feeling better now. Plan updated  about holding the discharge.  Objective: Vitals:   11/25/17 1130 11/25/17 1146 11/25/17 1545 11/25/17 1640  BP: (!) 83/48 126/65 (!) 80/55 100/66  Pulse: 94 93 82 83  Resp: 15 16 16    Temp:  98 F (36.7 C) 97.9 F (36.6 C)   TempSrc:  Oral Oral   SpO2:  100% 100% 99%   Weight:  61 kg (134 lb 7.7 oz)    Height:        Intake/Output Summary (Last 24 hours) at 11/25/2017 1748 Last data filed at 11/25/2017 1300 Gross per 24 hour  Intake 988 ml  Output 4450 ml  Net -3462 ml   Filed Weights   11/24/17 2055 11/25/17 0800 11/25/17  1146  Weight: 62.3 kg (137 lb 5.6 oz) 62.4 kg (137 lb 9.1 oz) 61 kg (134 lb 7.7 oz)    Examination:  General exam: Appears calm and comfortable ,Not in distress,average built Respiratory system: Bilateral equal air entry, normal vesicular breath sounds, no wheezes or crackles  Cardiovascular system: S1 & S2 heard, RRR. No JVD, murmurs, rubs, gallops or clicks. No pedal edema. Gastrointestinal system: Abdomen is nondistended, soft and nontender. No organomegaly or masses felt. Normal bowel sounds heard. Central nervous system: Alert and oriented. No focal neurological deficits. Extremities: No edema, no clubbing ,no cyanosis, distal peripheral pulses palpable. Skin: No rashes, lesions or ulcers,no icterus ,no pallor Psychiatry: Judgement and insight appear normal. Mood & affect appropriate.     Data Reviewed: I have personally reviewed following labs and imaging studies  CBC: Recent Labs  Lab 11/23/17 2213 11/25/17 0500  WBC 6.2 7.6  HGB 9.9* 9.1*  HCT 29.8* 27.9*  MCV 77.2* 76.9*  PLT 144* 093*   Basic Metabolic Panel: Recent Labs  Lab 11/23/17 2213 11/25/17 0815  NA 131* 133*  K 5.6* 3.2*  CL 96* 97*  CO2 24 28  GLUCOSE 68 151*  BUN 22* 12  CREATININE 4.56* 3.87*  CALCIUM 8.8* 8.7*  PHOS  --  3.2   GFR: Estimated Creatinine Clearance: 18 mL/min (A) (by C-G formula based on SCr of 3.87 mg/dL (H)). Liver Function Tests: Recent Labs  Lab 11/25/17 0815  ALBUMIN 2.7*   No results for input(s): LIPASE, AMYLASE in the last 168 hours. Recent Labs  Lab 11/24/17 0521  AMMONIA 64*   Coagulation Profile: Recent Labs  Lab 11/23/17 2213 11/25/17 0500  INR 1.75 1.91   Cardiac Enzymes: Recent Labs  Lab 11/24/17 0521 11/24/17 1157 11/24/17 2119  TROPONINI 0.08* 0.07* 0.05*   BNP (last 3 results) Recent Labs    03/16/17 1401  PROBNP 6,530*   HbA1C: Recent Labs    11/24/17 0521  HGBA1C 4.9   CBG: Recent Labs  Lab 11/24/17 0153 11/24/17 0653  11/24/17 1010 11/24/17 1055 11/24/17 1318  GLUCAP 77 68 57* 119* 83   Lipid Profile: Recent Labs    11/24/17 0521  CHOL 85  HDL 28*  LDLCALC 42  TRIG 76  CHOLHDL 3.0   Thyroid Function Tests: Recent Labs    11/24/17 0521  TSH 4.347   Anemia Panel: No results for input(s): VITAMINB12, FOLATE, FERRITIN, TIBC, IRON, RETICCTPCT in the last 72 hours. Sepsis Labs: No results for input(s): PROCALCITON, LATICACIDVEN in the last 168 hours.  Recent Results (from the past 240 hour(s))  MRSA PCR Screening     Status: None   Collection Time: 11/25/17  3:40 AM  Result Value Ref Range Status   MRSA by PCR NEGATIVE NEGATIVE Final    Comment:        The GeneXpert MRSA Assay (FDA approved for NASAL specimens only), is one component of a comprehensive MRSA colonization surveillance program. It is not intended to diagnose MRSA infection nor to guide or monitor treatment for MRSA infections.          Radiology  Studies: Dg Chest 2 View  Result Date: 11/23/2017 CLINICAL DATA:  Chest pain. EXAM: CHEST  2 VIEW COMPARISON:  09/18/2017 FINDINGS: Right-sided dialysis catheter in place. Post median sternotomy. Unchanged cardiomegaly. Bilateral pleural effusions, unchanged on the right and improved on the left with associated bibasilar opacities. Improved pulmonary edema. No new airspace disease. No pneumothorax. IMPRESSION: Cardiomegaly with persistent right pleural effusion and basilar opacity, likely atelectasis. Improved left pleural effusion from prior exam. Improved pulmonary edema. Electronically Signed   By: Jeb Levering M.D.   On: 11/23/2017 23:23        Scheduled Meds: . allopurinol  150 mg Oral Daily  . atorvastatin  20 mg Oral Daily  . benzonatate  100 mg Oral TID  . calcium acetate  2,001 mg Oral TID WC  . [START ON 11/26/2017] colchicine  0.3 mg Oral Once per day on Mon Thu  . docusate sodium  100 mg Oral BID  . folic acid  1 mg Oral Daily  . lactulose  20 g Oral  BID  . metoprolol succinate  25 mg Oral BID  . midodrine  10 mg Oral Once per day on Mon Wed Fri Sat  . pantoprazole  40 mg Oral Daily  . sodium chloride flush  3 mL Intravenous Q12H  . Warfarin - Pharmacist Dosing Inpatient   Does not apply q1800   Continuous Infusions: . sodium chloride       LOS: 1 day    Time spent: 25 mins    Nirali Magouirk Jodie Echevaria, MD [facility] Pager [phone]  If 7PM-7AM, please contact night-coverage [email or web address] Password [facility] 11/25/2017, 5:48 PM

## 2017-11-25 NOTE — H&P (Deleted)
Physician Discharge Summary  Michael [name] WOE:[id number] DOB: 1961-06-28 DOA: 11/23/2017  PCP: [name], MD  Admit date: 11/23/2017 Discharge date: 11/25/2017  Admitted From: Home Disposition:  Home  Recommendations for Outpatient Follow-up:  1. Follow up with PCP in 1-2 weeks 2. Please obtain BMP/CBC in one week     Discharge Condition:Stable CODE STATUS:Full Diet recommendation:Renal Diet  Brief/Interim Summary:  Admission H and P: Michael [name] is a 57 y.o. male with medical history significant of CKD on dialysis (MWFS), sCHF, CAD, s/p of CABG, DM, hypertension, A. Fibon Coumadin, liver cirrhosis, hepatic encephalopathy, mitral valve replacement with porcine valve, s/p of tricuspid valve repair, anemia, hyperlipidemia, chronic respiratory failure on 3 L oxygen at home, who presents with shortness of breath.  Pt state that he started having SOB this morning. He also reports intermittent productive cough,with blood-tinged mucus. States that he was unable to go tohis dialysis today due to weakness. Patient does not have fever or chills. He denies any chest pain. Patient does not have nausea, vomiting, diarrhea or abdominal pain. No unilateral weakness. Patient is mildly confused, but oriented 3. Per EMS, pt was found to have Irregular tachy at 170. Pt was given 6 and 12 mg of adenosine, and 5mg  metoprolol and 500 mls NS. His HR improved to 80-120 hr. Pt has AVF in left arm and tunnel catheter in right upper chest.  ED Course: pt was found to have troponin 0.10, INR 1.75, WBC 6.2, hemoglobin 9.9 which was 12.6 on 11/03/17, potassium of 5.6, bicarbonate 24, creatinine 4.56, BUN 22, temperature normal, oxygen saturation 94% on 3 L nasal cannula oxygen, soft blood pressure, tachypnea. Chest x-ray showed right pleural effusion and improved pulmonary edema, BNP 977. Patient is admitted to stepdown as inpatient.  Hospital Course: Patient evaluated by nephrology.  He underwent  hemodialysis twice. This afternoon he was found to be comfortable after dialysis.  He does not complain of any shortness of breath. I discussed with nephrology and plan is to discharge the patient to home.  We will resume his dialysis as an outpatient.  He will follow-up with his PCP and nephrologist as an outpatient.  Following problems were addressed during his hospitalization: Volume overload due to ESRD and acute on chronic systolic CHF: Patient's shortness of breath is most likely due to fluid overload. Underwent dialysis twice.Nephrology was following   elevated trop and hx of CAD: trop 0.10. He has mild chronically elevated troponin, baseline 0.3-0.05. He denies any chest pain. Likely due to end-stage renal disease and possible demand ischemia secondary to fluid overload.   Diet controlled type II diabetes mellitus with renal manifestations [facility] Last A1c 5.2, well controled. Patient is not taking meds at home.   Essential hypertension: bp is soft Continue metoprolol which is also for atrial fibrillation  Gout: Continue home allopurinol  Atrial fibrillation with RVR ([place]): HR has improved. CHA2DS2-VASc Score is 4Patient is on Coumadin at home. INR is 1.75 on admission.  -continue metoprolol.   Anemia, chronic disease: Associated with anemia of chronic disease secondary to chronic renal failure   HLD (hyperlipidemia): Continue Lipitor  Hyperkalemia: K 5.6. No T wave peaking. Given 30 g of Kayexalate       Discharge Diagnoses:  Principal Problem:   Volume overload Active Problems:   Type II diabetes mellitus with renal manifestations (HCC)   Essential hypertension   Gout   Atrial fibrillation with RVR (HCC)   Acute on chronic systolic CHF (congestive heart failure) (HCC)   Anemia,  chronic disease   GERD (gastroesophageal reflux disease)   Acute hepatic encephalopathy   Liver cirrhosis (HCC)   HLD (hyperlipidemia)   ESRD (end stage renal disease) on  dialysis (HCC)   CAD (coronary artery disease)   Hyperkalemia   Fluid overload   Elevated troponin    Discharge Instructions  Discharge Instructions    Diet - low sodium heart healthy   Complete by:  As directed    Renal diet   Discharge instructions   Complete by:  As directed    1) Follow up with your PCP in a week. 2) Continue dialysis as an outpatient.   Increase activity slowly   Complete by:  As directed      Allergies as of 11/25/2017   No Known Allergies     Medication List    TAKE these medications   acetaminophen 325 MG tablet Commonly known as:  TYLENOL Take 650 mg by mouth every 6 (six) hours.   acetaminophen-codeine 300-30 MG tablet Commonly known as:  TYLENOL #3 Take 1 tablet by mouth every 6 (six) hours as needed for moderate pain.   allopurinol 300 MG tablet Commonly known as:  ZYLOPRIM Take 0.5 tablets (150 mg total) by mouth daily.   atorvastatin 20 MG tablet Commonly known as:  LIPITOR Take 1 tablet (20 mg total) by mouth daily.   calcium acetate 667 MG capsule Commonly known as:  PHOSLO Take 1 capsule (667 mg total) by mouth 3 (three) times daily with meals. What changed:    how much to take  when to take this  additional instructions   colchicine 0.6 MG tablet Take 0.5 tablets (0.3 mg total) by mouth 2 (two) times a week.   diclofenac sodium 1 % Gel Commonly known as:  VOLTAREN Apply 4 g topically 4 (four) times daily as needed (pain).   docusate sodium 100 MG capsule Commonly known as:  COLACE Take 1 capsule (100 mg total) by mouth 2 (two) times daily.   folic acid 1 MG tablet Commonly known as:  FOLVITE TAKE 1 TABLET BY MOUTH DAILY.   hydrOXYzine 25 MG tablet Commonly known as:  ATARAX/VISTARIL 1/2-1 tablets tid prn itching What changed:    how much to take  how to take this  when to take this  reasons to take this  additional instructions   metoprolol succinate 25 MG 24 hr tablet Commonly known as:   TOPROL-XL Take 1 tablet (25 mg total) by mouth 2 (two) times daily. What changed:  when to take this   midodrine 5 MG tablet Commonly known as:  PROAMATINE Take 2 tablets (10 mg total) by mouth 3 (three) times daily with meals.   omeprazole 20 MG capsule Commonly known as:  PRILOSEC Take 1 capsule (20 mg total) by mouth daily.   oxycodone 5 MG capsule Commonly known as:  OXY-IR Take 5 mg by mouth every 8 (eight) hours as needed for pain.   OXYGEN Inhale 2 L into the lungs daily as needed.   tiZANidine 4 MG tablet Commonly known as:  ZANAFLEX Take 1 tablet (4 mg total) by mouth every 8 (eight) hours as needed for muscle spasms.   warfarin 2.5 MG tablet Commonly known as:  COUMADIN Take as directed. If you are unsure how to take this medication, talk to your nurse or doctor. Original instructions:  Take 1 tablet (2.5 mg total) by mouth daily at 6 PM. Or as directed by coumadin clinic What changed:  how much to take  when to take this  additional instructions      Follow-up Information    [name], MD. Schedule an appointment as soon as possible for a visit in 1 week(s).   Specialty:  Family Medicine Contact information: [place] [address] [phone]          No Known Allergies  Consultations:  Nephrology   Procedures/Studies: Dg Chest 2 View  Result Date: 11/23/2017 CLINICAL DATA:  Chest pain. EXAM: CHEST  2 VIEW COMPARISON:  09/18/2017 FINDINGS: Right-sided dialysis catheter in place. Post median sternotomy. Unchanged cardiomegaly. Bilateral pleural effusions, unchanged on the right and improved on the left with associated bibasilar opacities. Improved pulmonary edema. No new airspace disease. No pneumothorax. IMPRESSION: Cardiomegaly with persistent right pleural effusion and basilar opacity, likely atelectasis. Improved left pleural effusion from prior exam. Improved pulmonary edema. Electronically Signed   By: Jeb Levering M.D.   On: 11/23/2017 23:23       Subjective: Patient seen and examined the bedside this afternoon.  Remains comfortable.  Underwent dialysis today this morning.  No new issues.  Being discharged to home  Discharge Exam: Vitals:   11/25/17 1130 11/25/17 1146  BP: (!) 83/48 126/65  Pulse: 94 93  Resp: 15 16  Temp:  98 F (36.7 C)  SpO2: 100% 100%   Vitals:   11/25/17 1100 11/25/17 1115 11/25/17 1130 11/25/17 1146  BP: (!) 62/33 (!) 94/58 (!) 83/48 126/65  Pulse: 76 81 94 93  Resp: 18 17 15 16   Temp:    98 F (36.7 C)  TempSrc:    Oral  SpO2: 100% 100% 100% 100%  Weight:    61 kg (134 lb 7.7 oz)  Height:        General: Pt is alert, awake, not in acute distress Cardiovascular: RRR, S1/S2 +, no rubs, no gallops Respiratory: CTA bilaterally, no wheezing, no rhonchi Abdominal: Soft, NT, ND, bowel sounds + Extremities: no edema, no cyanosis    The results of significant diagnostics from this hospitalization (including imaging, microbiology, ancillary and laboratory) are listed below for reference.     Microbiology: Recent Results (from the past 240 hour(s))  MRSA PCR Screening     Status: None   Collection Time: 11/25/17  3:40 AM  Result Value Ref Range Status   MRSA by PCR NEGATIVE NEGATIVE Final    Comment:        The GeneXpert MRSA Assay (FDA approved for NASAL specimens only), is one component of a comprehensive MRSA colonization surveillance program. It is not intended to diagnose MRSA infection nor to guide or monitor treatment for MRSA infections.      Labs: BNP (last 3 results) Recent Labs    05/30/17 1208 06/25/17 1253 11/23/17 2222  BNP 1,404.9* 875.7* 595.6*   Basic Metabolic Panel: Recent Labs  Lab 11/23/17 2213 11/25/17 0815  NA 131* 133*  K 5.6* 3.2*  CL 96* 97*  CO2 24 28  GLUCOSE 68 151*  BUN 22* 12  CREATININE 4.56* 3.87*  CALCIUM 8.8* 8.7*  PHOS  --  3.2   Liver Function Tests: Recent Labs  Lab 11/25/17 0815   ALBUMIN 2.7*   No results for input(s): LIPASE, AMYLASE in the last 168 hours. Recent Labs  Lab 11/24/17 0521  AMMONIA 64*   CBC: Recent Labs  Lab 11/23/17 2213 11/25/17 0500  WBC 6.2 7.6  HGB 9.9* 9.1*  HCT 29.8* 27.9*  MCV 77.2* 76.9*  PLT 144* 117*   Cardiac Enzymes: Recent Labs  Lab 11/24/17 0521 11/24/17 1157 11/24/17 2119  TROPONINI 0.08* 0.07* 0.05*   BNP: Invalid input(s): POCBNP CBG: Recent Labs  Lab 11/24/17 0153 11/24/17 0653 11/24/17 1010 11/24/17 1055 11/24/17 1318  GLUCAP 77 68 57* 119* 83   D-Dimer No results for input(s): DDIMER in the last 72 hours. Hgb A1c Recent Labs    11/24/17 0521  HGBA1C 4.9   Lipid Profile Recent Labs    11/24/17 0521  CHOL 85  HDL 28*  LDLCALC 42  TRIG 76  CHOLHDL 3.0   Thyroid function studies Recent Labs    11/24/17 0521  TSH 4.347   Anemia work up No results for input(s): VITAMINB12, FOLATE, FERRITIN, TIBC, IRON, RETICCTPCT in the last 72 hours. Urinalysis    Component Value Date/Time   COLORURINE AMBER (A) 07/25/2017 1806   APPEARANCEUR HAZY (A) 07/25/2017 1806   LABSPEC 1.018 07/25/2017 1806   PHURINE 5.0 07/25/2017 1806   GLUCOSEU NEGATIVE 07/25/2017 1806   HGBUR MODERATE (A) 07/25/2017 1806   BILIRUBINUR NEGATIVE 07/25/2017 1806   KETONESUR NEGATIVE 07/25/2017 1806   PROTEINUR 100 (A) 07/25/2017 1806   NITRITE NEGATIVE 07/25/2017 1806   LEUKOCYTESUR NEGATIVE 07/25/2017 1806   Sepsis Labs Invalid input(s): PROCALCITONIN,  WBC,  LACTICIDVEN Microbiology Recent Results (from the past 240 hour(s))  MRSA PCR Screening     Status: None   Collection Time: 11/25/17  3:40 AM  Result Value Ref Range Status   MRSA by PCR NEGATIVE NEGATIVE Final    Comment:        The GeneXpert MRSA Assay (FDA approved for NASAL specimens only), is one component of a comprehensive MRSA colonization surveillance program. It is not intended to diagnose MRSA infection nor to guide or monitor treatment  for MRSA infections.      Time coordinating discharge: Over 30 minutes  SIGNED:   Marene Lenz, MD  [facility] 11/25/2017, 1:05 PM Pager [phone]  If 7PM-7AM, please contact night-coverage [email or web address] Password [email or web address]

## 2017-11-25 NOTE — Procedures (Signed)
Seen on HD, 2.5L UF BFR 400. Feels improved today.  Exam: RRR, Bibasilar crackles (dull on RLL), no LE edema, TDC, maturing LUE AVF  Dialysis Orders:  MWFSa at [facility] 4hr, 300/800, EDW 68.5kg, 3K/2.25Ca bath, TDC, no heparin - Mircera 3mcg IV q 2 weeks (last given 11/18/17) - No VDRA  Assessment/Plan: 1.  Dyspnea/volume overload: S/p HD 1/8 and again today. 2.  ESRD: Back on MWFSa schedule now 3.  BP/volume: Chronic hyPOtension, on midodrine as outpt, ordered here pre-HD. UF as tolerated. Will need lower EDW on discharge. 4.  Anemia of CKD: Hgb 9.9. Monitor for now. No ESA yet. 5.  Metabolic bone disease: Ca ok, Phos pending. Follow labs. 6.  CAD/dilated cardiomyopathy: Hx cardiac arrest on HD. Follow. 7.  DM: Per primary. 8. A-fib (on warfarin): Per primary.  Pt seen, examined and agree w A/P as above.  Kelly Splinter MD [employer] pager [phone]   11/25/2017, 1:04 PM

## 2017-11-25 NOTE — Consult Note (Addendum)
   [facility] CM Inpatient Consult   11/25/2017  Rashed Seybold 1961-08-25 [id number]    Went to bedside to speak with Mr. Plasencia about ongoing [place] Management services.  No family was present at the time. Bedside conversation very brief as Mr. Kronk was drowsy and did not stay awake to speak with Probation officer.  Please see chart review then encounters for patient outreach details for [facility] team notes.  [facility] Pharmacist has been following patient. It appears Mr. Paredez has declined [place] follow up prior to admit.   Will attempt to follow back up with patient at later time to discuss [place] Management services.     [name], MSN-Ed, RN,BSN [facility] Liaison [phone]

## 2017-11-25 NOTE — Progress Notes (Signed)
Echocardiogram 2D Echocardiogram has been performed.  11/25/2017 3:49 PM Maudry Mayhew, BS, RVT, RDCS, RDMS

## 2017-11-25 NOTE — Progress Notes (Signed)
Patient returned from echo, BP was low, patient was drowsy. MD aware. D/c canceled. Patients family arrived and said patients behavior is normal following a HD treatment. Will continue to monitor

## 2017-11-26 ENCOUNTER — Other Ambulatory Visit: Payer: Self-pay | Admitting: Pharmacist

## 2017-11-26 ENCOUNTER — Ambulatory Visit: Payer: Medicare Other | Admitting: Pharmacist

## 2017-11-26 LAB — CBC
HEMATOCRIT: 30.9 % — AB (ref 39.0–52.0)
Hemoglobin: 10.3 g/dL — ABNORMAL LOW (ref 13.0–17.0)
MCH: 26 pg (ref 26.0–34.0)
MCHC: 33.3 g/dL (ref 30.0–36.0)
MCV: 78 fL (ref 78.0–100.0)
PLATELETS: 107 10*3/uL — AB (ref 150–400)
RBC: 3.96 MIL/uL — AB (ref 4.22–5.81)
RDW: 18.6 % — AB (ref 11.5–15.5)
WBC: 6.2 10*3/uL (ref 4.0–10.5)

## 2017-11-26 LAB — BASIC METABOLIC PANEL
ANION GAP: 9 (ref 5–15)
BUN: 9 mg/dL (ref 6–20)
CO2: 27 mmol/L (ref 22–32)
Calcium: 8.7 mg/dL — ABNORMAL LOW (ref 8.9–10.3)
Chloride: 100 mmol/L — ABNORMAL LOW (ref 101–111)
Creatinine, Ser: 3.25 mg/dL — ABNORMAL HIGH (ref 0.61–1.24)
GFR, EST AFRICAN AMERICAN: 23 mL/min — AB (ref >60)
GFR, EST NON AFRICAN AMERICAN: 19 mL/min — AB (ref >60)
Glucose, Bld: 84 mg/dL (ref 65–99)
POTASSIUM: 3.3 mmol/L — AB (ref 3.5–5.1)
Sodium: 136 mmol/L (ref 135–145)

## 2017-11-26 LAB — PROTIME-INR
INR: 1.73
Prothrombin Time: 20.1 seconds — ABNORMAL HIGH (ref 11.4–15.2)

## 2017-11-26 LAB — GLUCOSE, CAPILLARY: Glucose-Capillary: 128 mg/dL — ABNORMAL HIGH (ref 65–99)

## 2017-11-26 LAB — AMMONIA: AMMONIA: 39 umol/L — AB (ref 9–35)

## 2017-11-26 MED ORDER — WARFARIN SODIUM 5 MG PO TABS
5.0000 mg | ORAL_TABLET | Freq: Once | ORAL | Status: AC
  Administered 2017-11-26: 5 mg via ORAL
  Filled 2017-11-26: qty 1

## 2017-11-26 MED ORDER — BENZONATATE 100 MG PO CAPS: 100.0000 mg | ORAL_CAPSULE | Freq: Three times a day (TID) | ORAL | 0 refills | Status: DC

## 2017-11-26 MED ORDER — LACTULOSE 10 GM/15ML PO SOLN: 20.0000 g | Freq: Two times a day (BID) | ORAL | 0 refills | Status: DC

## 2017-11-26 MED ORDER — MIDODRINE HCL 5 MG PO TABS
5.0000 mg | ORAL_TABLET | Freq: Three times a day (TID) | ORAL | Status: DC
  Administered 2017-11-26 (×3): 5 mg via ORAL
  Filled 2017-11-26 (×3): qty 1

## 2017-11-26 MED ORDER — SODIUM CHLORIDE 0.9 % IV BOLUS (SEPSIS)
500.0000 mL | Freq: Once | INTRAVENOUS | Status: AC
  Administered 2017-11-26: 500 mL via INTRAVENOUS

## 2017-11-26 NOTE — Hospital Discharge Follow-Up (Signed)
The patient is known to the [facility].  Met with him this morning and scheduled him an appointment with Dr Jarold Song on 12/01/17 @ 1400.  The information was placed on the AVS.   The patient stated that he now has a roommate, Raquel Sarna, who helps him with grocery shopping, meal preparation, and housekeeping.   He has applied for medicaid and is waiting for a decision on the application.

## 2017-11-26 NOTE — Progress Notes (Signed)
[facility] Progress Note   Subjective:   Seen in room, dyspnea improved. On room air. S/p 1.5L with HD yesterday, now 7.5kg under EDW.  Objective Vitals:   11/26/17 0116 11/26/17 0600 11/26/17 0803 11/26/17 1001  BP:  (!) 76/53 109/62 (!) 100/58  Pulse:  70 75 77  Resp:  18  18  Temp:  97.8 F (36.6 C)  98.3 F (36.8 C)  TempSrc:  Oral  Oral  SpO2:  100%  98%  Weight: 61 kg (134 lb 7.7 oz)     Height:       Physical Exam General: NAD Heart: RRR Lungs: CTAB Extremities: No LE edmea Dialysis Access: AVF  Additional Objective Labs: Basic Metabolic Panel: Recent Labs  Lab 11/23/17 2213 11/25/17 0815 11/26/17 0831  NA 131* 133* 136  K 5.6* 3.2* 3.3*  CL 96* 97* 100*  CO2 24 28 27   GLUCOSE 68 151* 84  BUN 22* 12 9  CREATININE 4.56* 3.87* 3.25*  CALCIUM 8.8* 8.7* 8.7*  PHOS  --  3.2  --    Liver Function Tests: Recent Labs  Lab 11/25/17 0815  ALBUMIN 2.7*    CBC: Recent Labs  Lab 11/23/17 2213 11/25/17 0500 11/26/17 0831  WBC 6.2 7.6 6.2  HGB 9.9* 9.1* 10.3*  HCT 29.8* 27.9* 30.9*  MCV 77.2* 76.9* 78.0  PLT 144* 117* 107*   Cardiac Enzymes: Recent Labs  Lab 11/24/17 0521 11/24/17 1157 11/24/17 2119  TROPONINI 0.08* 0.07* 0.05*   CBG: Recent Labs  Lab 11/24/17 0653 11/24/17 1010 11/24/17 1055 11/24/17 1318 11/26/17 0612  GLUCAP 68 57* 119* 83 128*   Medications: . sodium chloride     . allopurinol  150 mg Oral Daily  . atorvastatin  20 mg Oral Daily  . benzonatate  100 mg Oral TID  . calcium acetate  2,001 mg Oral TID WC  . colchicine  0.3 mg Oral Once per day on Mon Thu  . docusate sodium  100 mg Oral BID  . folic acid  1 mg Oral Daily  . lactulose  20 g Oral BID  . midodrine  5 mg Oral TID WC  . pantoprazole  40 mg Oral Daily  . sodium chloride flush  3 mL Intravenous Q12H  . warfarin  5 mg Oral ONCE-1800  . Warfarin - Pharmacist Dosing Inpatient   Does not apply q1800    Dialysis Orders: [facility] at  [facility] 4hr, 300/800, EDW 68.5kg, 3K/2.25Ca bath, TDC, no heparin - Mircera 82mcg IV q 2 weeks (last given 11/18/17) - No VDRA  Assessment/Plan: 1. Dyspnea/volume overload: much improved, new EDW 61kg (down 15lb this admit).  2. ESRD:Back on MWFSa schedule now 3. BP/volume:Chronic hyPOtension due to severe heart disease, on midodrine as outpt, ordered here pre-HD. UF as tolerated. Will need lower EDW on discharge.   4. Anemiaof CKD: Hgb 10.3. Monitor for now. No ESA yet. 5. Metabolic bone disease:Ca ok, Phos pending. Follow labs. 6. CAD/dilated cardiomyopathy: Hx cardiac arrest on HD. Very poor prognosis in the larger scheme, patient is aware and has been counseled about prognosis in the past.   7. DM: Per primary. 8. A-fib (on warfarin): Per primary. 9.   Dispo: please dc home, will get OP HD tomorrow and Sat , have d/w pmd.   Veneta Penton, PA-C 11/26/2017, 10:43 AM  [facility] Pager: [phone]  Pt seen, examined and agree w A/P as above.  Kelly Splinter MD [employer] pager [phone]  11/26/2017, 11:49 AM

## 2017-11-26 NOTE — Discharge Summary (Signed)
Physician Discharge Summary  Michael [name] XMI:[id number] DOB: [birth year] DOA: 11/23/2017  PCP: [name], MD  Admit date: 11/23/2017 Discharge date: 11/26/2017  Admitted From: Home Disposition:  Home  Recommendations for Outpatient Follow-up:  1. Follow up with PCP in 1-2 weeks 2. Please obtain BMP/CBC in one week.Check ammonia level in a week.   Discharge Condition:Stable CODE STATUS:Full Diet recommendation: Renal  Brief/Interim Summary:   Admission H and P:  Michael [name] is a 57 y.o. male with medical history significant of CKD on dialysis (MWFS), sCHF, CAD, s/p of CABG, DM, hypertension, A. Fib on Coumadin, liver cirrhosis, hepatic encephalopathy, mitral valve replacement with porcine valve, s/p of tricuspid valve repair, anemia, hyperlipidemia, chronic respiratory failure on 3 L oxygen at home, who presents with shortness of breath.     Pt state that he started having SOB this morning. He also reports intermittent productive cough, with blood-tinged mucus. States that he was unable to go to his dialysis today due to weakness. Patient does not have fever or chills. He denies any chest pain. Patient does not have nausea, vomiting, diarrhea or abdominal pain. No unilateral weakness. Patient is mildly confused, but oriented 3. Per EMS, pt was found to have Irregular tachy at 170. Pt was given 6 and 12 mg of adenosine, and 5mg  metoprolol and 500 mls NS. His HR improved to 80-120 hr. Pt has AVF in left arm and tunnel catheter in right upper chest.     ED Course: pt was found to have troponin 0.10, INR 1.75, WBC 6.2, hemoglobin 9.9 which was 12.6 on 11/03/17, potassium of 5.6, bicarbonate 24, creatinine 4.56, BUN 22, temperature normal, oxygen saturation 94% on 3 L nasal cannula oxygen, soft blood pressure, tachypnea. Chest x-ray showed right pleural effusion and improved pulmonary edema, BNP 977. Patient is admitted to stepdown as inpatient.     Hospital  Course:  Patient evaluated by nephrology.  He underwent hemodialysis twice. He was found to be comfortable after dialysis.  He does not complain of any shortness of breath. I discussed with nephrology and plan is to discharge the patient to home.  We will resume his dialysis as an outpatient.  He will follow-up with his PCP and nephrologist as an outpatient.  Following problems were addressed during his hospitalization:  Volume overload due to ESRD and acute on chronic systolic CHF: Patient's shortness of breath is most likely due to fluid overload. Underwent dialysis twice.Nephrology was following   Elevated trop and hx of CAD: trop 0.10. He has mild chronically elevated troponin, baseline 0.3-0.05. He denies any chest pain. Likely due to end-stage renal disease and possible demand ischemia secondary to fluid overload.   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 Patient is not taking meds at home.    Essential hypertension: bp is soft Continue metoprolol which is also for atrial fibrillation.Also on midodrine Gout: Continue home allopurinol   Atrial fibrillation with RVR ([place]ia): HR has improved. CHA2DS2-VASc Score is 4Patient is on Coumadin at home. INR is 1.75 on admission.  Continue metoprolol.   Anemia, chronic disease: Associated with anemia of chronic disease secondary to chronic renal failure  HLD (hyperlipidemia): Continue Lipitor  Hyperkalemia: K 5.6. No T wave peaking. Given 30 g of Kayexalate   Hepatic cirrhosis/elevated ammonia level: Started on lactulose.  We should check ammonia level in a week as an outpatient  Discharge Diagnoses:  Principal Problem:   Volume overload Active Problems:   Type II diabetes mellitus with renal manifestations (HCC)   Essential hypertension   Gout   Atrial fibrillation with RVR (HCC)   Acute on chronic systolic CHF (congestive heart failure)  (HCC)   Anemia, chronic disease   GERD (gastroesophageal reflux disease)   Acute hepatic encephalopathy   Liver cirrhosis (HCC)   HLD (hyperlipidemia)   ESRD (end stage renal disease) on dialysis (HCC)   CAD (coronary artery disease)   Hyperkalemia   Fluid overload   Elevated troponin    Discharge Instructions  Discharge Instructions    Diet - low sodium heart healthy   Complete by:  As directed    Renal diet   Discharge instructions   Complete by:  As directed    1) Follow up with your PCP in a week. 2) Continue dialysis as an outpatient. 3) Take prescribed medications as instructed.  Check ammonia level in a week.   Increase activity slowly   Complete by:  As directed      Allergies as of 11/26/2017   No Known Allergies     Medication List    TAKE these medications   acetaminophen 325 MG tablet Commonly known as:  TYLENOL Take 650 mg by mouth every 6 (six) hours.   acetaminophen-codeine 300-30 MG tablet Commonly known as:  TYLENOL #3 Take 1 tablet by mouth every 6 (six) hours as needed for moderate pain.   allopurinol 300 MG tablet Commonly known as:  ZYLOPRIM Take 0.5 tablets (150 mg total) by mouth daily.   atorvastatin 20 MG tablet Commonly known as:  LIPITOR Take 1 tablet (20 mg total) by mouth daily.   benzonatate 100 MG capsule Commonly known as:  TESSALON Take 1 capsule (100 mg total) by mouth 3 (three) times daily.   calcium acetate 667 MG capsule Commonly known as:  PHOSLO Take 1 capsule (667 mg total) by mouth 3 (three) times daily with meals. What changed:    how much to take  when to take this  additional instructions   colchicine 0.6 MG tablet Take 0.5 tablets (0.3 mg total) by mouth 2 (two) times a week.   diclofenac sodium 1 % Gel Commonly known as:  VOLTAREN Apply 4 g topically 4 (four) times daily as needed (pain).   docusate sodium 100 MG capsule Commonly known as:  COLACE Take 1 capsule (100 mg total) by mouth 2 (two)  times daily.   folic acid 1 MG tablet Commonly known as:  FOLVITE TAKE 1 TABLET BY MOUTH DAILY. What changed:    how much to take  how to take this  when to take this   hydrOXYzine 25 MG tablet Commonly known as:  ATARAX/VISTARIL 1/2-1 tablets tid prn itching What changed:    how much to take  how to take this  when to take this  reasons to take this  additional instructions   lactulose 10 GM/15ML solution Commonly known as:  CHRONULAC Take 30 mLs (20 g total) by mouth 2 (two) times daily for 7 days.   metoprolol succinate 25 MG 24 hr tablet Commonly known as:  TOPROL-XL Take 1 tablet (25 mg total) by mouth 2 (two) times daily. What changed:  when to take this   midodrine 5 MG tablet Commonly known as:  PROAMATINE Take 2 tablets (10 mg total) by mouth 3 (three) times daily with meals.   omeprazole 20 MG capsule Commonly known as:  PRILOSEC Take 1 capsule (20 mg total) by mouth daily.   oxycodone 5 MG capsule Commonly known as:  OXY-IR Take 5 mg by mouth every 8 (eight) hours as needed for pain.   OXYGEN Inhale 2 L into the lungs daily as needed.   tiZANidine 4 MG tablet Commonly known as:  ZANAFLEX Take 1 tablet (4 mg total) by mouth every 8 (eight) hours as needed for muscle spasms.   warfarin 2.5 MG tablet Commonly known as:  COUMADIN Take as directed. If you are unsure how to take this medication, talk to your nurse or doctor. Original instructions:  Take 1 tablet (2.5 mg total) by mouth daily at 6 PM. Or as directed by coumadin clinic What changed:    how much to take  when to take this  additional instructions      Follow-up Information    [name], MD. Schedule an appointment as soon as possible for a visit in 1 week(s).   Specialty:  Family Medicine Contact information: [place] [address] [phone]          No Known Allergies  Consultations:  Nephrology   Procedures/Studies: Dg Chest 2  View  Result Date: 11/23/2017 CLINICAL DATA:  Chest pain. EXAM: CHEST  2 VIEW COMPARISON:  09/18/2017 FINDINGS: Right-sided dialysis catheter in place. Post median sternotomy. Unchanged cardiomegaly. Bilateral pleural effusions, unchanged on the right and improved on the left with associated bibasilar opacities. Improved pulmonary edema. No new airspace disease. No pneumothorax. IMPRESSION: Cardiomegaly with persistent right pleural effusion and basilar opacity, likely atelectasis. Improved left pleural effusion from prior exam. Improved pulmonary edema. Electronically Signed   By: Jeb Levering M.D.   On: 11/23/2017 23:23       Subjective: Patient seen and examined the bedside this morning.  Feels comfortable.  This morning his blood pressure was acceptable.  Explained the plan about the discharge.  Discharge Exam: Vitals:   11/26/17 0803 11/26/17 1001  BP: 109/62 (!) 100/58  Pulse: 75 77  Resp:  18  Temp:  98.3 F (36.8 C)  SpO2:  98%   Vitals:   11/26/17 0116 11/26/17 0600 11/26/17 0803 11/26/17 1001  BP:  (!) 76/53 109/62 (!) 100/58  Pulse:  70 75 77  Resp:  18  18  Temp:  97.8 F (36.6 C)  98.3 F (36.8 C)  TempSrc:  Oral  Oral  SpO2:  100%  98%  Weight: 61 kg (134 lb 7.7 oz)     Height:        General: Pt is alert, awake, not in acute distress Cardiovascular: RRR, S1/S2 +, no rubs, no gallops Respiratory: CTA bilaterally, no wheezing, no rhonchi Abdominal: Soft, NT, ND, bowel sounds + Extremities: no edema, no cyanosis    The results of significant diagnostics from this hospitalization (including imaging, microbiology, ancillary and laboratory) are listed below for reference.     Microbiology: Recent Results (from the past 240 hour(s))  MRSA PCR Screening     Status: None   Collection Time: 11/25/17  3:40 AM  Result Value Ref Range Status   MRSA by PCR NEGATIVE NEGATIVE Final    Comment:        The GeneXpert MRSA Assay (FDA approved for NASAL  specimens only), is one component of a comprehensive MRSA colonization surveillance program. It is not intended to diagnose MRSA infection nor to guide or monitor treatment for MRSA infections.      Labs: BNP (last 3  results) Recent Labs    05/30/17 1208 06/25/17 1253 11/23/17 2222  BNP 1,404.9* 875.7* 165.7*   Basic Metabolic Panel: Recent Labs  Lab 11/23/17 2213 11/25/17 0815 11/26/17 0831  NA 131* 133* 136  K 5.6* 3.2* 3.3*  CL 96* 97* 100*  CO2 24 28 27   GLUCOSE 68 151* 84  BUN 22* 12 9  CREATININE 4.56* 3.87* 3.25*  CALCIUM 8.8* 8.7* 8.7*  PHOS  --  3.2  --    Liver Function Tests: Recent Labs  Lab 11/25/17 0815  ALBUMIN 2.7*   No results for input(s): LIPASE, AMYLASE in the last 168 hours. Recent Labs  Lab 11/24/17 0521 11/26/17 0831  AMMONIA 64* 39*   CBC: Recent Labs  Lab 11/23/17 2213 11/25/17 0500 11/26/17 0831  WBC 6.2 7.6 6.2  HGB 9.9* 9.1* 10.3*  HCT 29.8* 27.9* 30.9*  MCV 77.2* 76.9* 78.0  PLT 144* 117* 107*   Cardiac Enzymes: Recent Labs  Lab 11/24/17 0521 11/24/17 1157 11/24/17 2119  TROPONINI 0.08* 0.07* 0.05*   BNP: Invalid input(s): POCBNP CBG: Recent Labs  Lab 11/24/17 0653 11/24/17 1010 11/24/17 1055 11/24/17 1318 11/26/17 0612  GLUCAP 68 57* 119* 83 128*   D-Dimer No results for input(s): DDIMER in the last 72 hours. Hgb A1c Recent Labs    11/24/17 0521  HGBA1C 4.9   Lipid Profile Recent Labs    11/24/17 0521  CHOL 85  HDL 28*  LDLCALC 42  TRIG 76  CHOLHDL 3.0   Thyroid function studies Recent Labs    11/24/17 0521  TSH 4.347   Anemia work up No results for input(s): VITAMINB12, FOLATE, FERRITIN, TIBC, IRON, RETICCTPCT in the last 72 hours. Urinalysis    Component Value Date/Time   COLORURINE AMBER (A) 07/25/2017 1806   APPEARANCEUR HAZY (A) 07/25/2017 1806   LABSPEC 1.018 07/25/2017 1806   PHURINE 5.0 07/25/2017 1806   GLUCOSEU NEGATIVE 07/25/2017 1806   HGBUR MODERATE (A)  07/25/2017 1806   BILIRUBINUR NEGATIVE 07/25/2017 1806   KETONESUR NEGATIVE 07/25/2017 1806   PROTEINUR 100 (A) 07/25/2017 1806   NITRITE NEGATIVE 07/25/2017 1806   LEUKOCYTESUR NEGATIVE 07/25/2017 1806   Sepsis Labs Invalid input(s): PROCALCITONIN,  WBC,  LACTICIDVEN Microbiology Recent Results (from the past 240 hour(s))  MRSA PCR Screening     Status: None   Collection Time: 11/25/17  3:40 AM  Result Value Ref Range Status   MRSA by PCR NEGATIVE NEGATIVE Final    Comment:        The GeneXpert MRSA Assay (FDA approved for NASAL specimens only), is one component of a comprehensive MRSA colonization surveillance program. It is not intended to diagnose MRSA infection nor to guide or monitor treatment for MRSA infections.      Time coordinating discharge: Over 30 minutes  SIGNED:   Marene Lenz, MD  [facility] 11/26/2017, 11:11 AM Pager [phone]  If 7PM-7AM, please contact night-coverage [email or web address] Password [email or web address]

## 2017-11-26 NOTE — Progress Notes (Signed)
Pt refused to wear skid resistant socks.

## 2017-11-26 NOTE — Progress Notes (Signed)
[place] for Warfarin  Indication: atrial fibrillation  No Known Allergies  Patient Measurements: Height: 5\' 5"  (165.1 cm) Weight: 134 lb 7.7 oz (61 kg) IBW/kg (Calculated) : 61.5  Vital Signs: Temp: 98.3 F (36.8 C) (01/10 1001) Temp Source: Oral (01/10 1001) BP: 100/58 (01/10 1001) Pulse Rate: 77 (01/10 1001)  Labs: Recent Labs    11/23/17 2213 11/24/17 0521 11/24/17 1157 11/24/17 2119 11/25/17 0500 11/25/17 0815 11/26/17 0831  HGB 9.9*  --   --   --  9.1*  --  10.3*  HCT 29.8*  --   --   --  27.9*  --  30.9*  PLT 144*  --   --   --  117*  --  107*  LABPROT 20.3*  --   --   --  21.7*  --  20.1*  INR 1.75  --   --   --  1.91  --  1.73  CREATININE 4.56*  --   --   --   --  3.87* 3.25*  TROPONINI  --  0.08* 0.07* 0.05*  --   --   --     Estimated Creatinine Clearance: 21.4 mL/min (A) (by C-G formula based on SCr of 3.25 mg/dL (H)).   Assessment: 57 y/o M with cough and shortness of breath, on warfarin PTA for afib and porcine MVR. Warfarin PTA dosing per outpatient anti-coag notes: 3.75 mg on Wed/Sat, 2.5 mg all other days.  Noted patient has been contacted by [facility] about assistance with pillbox (friend fills the box for patient and he has refused bubble-packing services previously).  INR low on admission at 1.75. He missed his dose on 1/8 (no reason charted), received dose last evening. INR today 1.71.  Hgb and plts low, but stable- no bleeding noted.  Per discussion during progression rounds, patient likely to discharge today.  Goal of Therapy:  INR 2-3 Monitor platelets by anticoagulation protocol: Yes   Plan:  -Recommend Warfarin 5mg  PO on 1/10 and 1/11, then resume home dose (2.5mg  daily except 3.75mg  on Wednesdays and Saturdays) starting 1/12 -Daily INR while here, recommend check as an outpatient on Monday 1/14  Michael [name] D. Elida Harbin, PharmD, BCPS Clinical Pharmacist Clinical Phone for 11/26/2017 until 3:30pm:  [phone] If after 3:30pm, please call main pharmacy at [phone] 11/26/2017 10:25 AM

## 2017-11-26 NOTE — Progress Notes (Signed)
Iv removed, tele box returned, AVS reviewed, patient is dressed and waiting for ride

## 2017-11-26 NOTE — Patient Outreach (Signed)
[facility]) Care Management  11/26/2017  [place] 01-07-1961 [id number]  Patient was to have [facility] Pharmacist home visit today at 1000---noted per chart review he was admitted at time of planned home visit so it was cancelled.    Plan:  Will follow-up with patient next week to see if he wants to re-schedule home visit as multiple attempts have been made with patient.   [name], PharmD, [place] [phone]

## 2017-11-26 NOTE — Progress Notes (Signed)
Patient left floor via wheelchair with staff member

## 2017-11-27 DIAGNOSIS — E876 Hypokalemia: Secondary | ICD-10-CM | POA: Diagnosis not present

## 2017-11-27 DIAGNOSIS — Z992 Dependence on renal dialysis: Secondary | ICD-10-CM | POA: Diagnosis not present

## 2017-11-27 DIAGNOSIS — D631 Anemia in chronic kidney disease: Secondary | ICD-10-CM | POA: Diagnosis not present

## 2017-11-27 DIAGNOSIS — N2581 Secondary hyperparathyroidism of renal origin: Secondary | ICD-10-CM | POA: Diagnosis not present

## 2017-11-27 DIAGNOSIS — N186 End stage renal disease: Secondary | ICD-10-CM | POA: Diagnosis not present

## 2017-11-27 DIAGNOSIS — E119 Type 2 diabetes mellitus without complications: Secondary | ICD-10-CM | POA: Diagnosis not present

## 2017-11-28 DIAGNOSIS — D631 Anemia in chronic kidney disease: Secondary | ICD-10-CM | POA: Diagnosis not present

## 2017-11-28 DIAGNOSIS — N186 End stage renal disease: Secondary | ICD-10-CM | POA: Diagnosis not present

## 2017-11-28 DIAGNOSIS — E119 Type 2 diabetes mellitus without complications: Secondary | ICD-10-CM | POA: Diagnosis not present

## 2017-11-28 DIAGNOSIS — Z992 Dependence on renal dialysis: Secondary | ICD-10-CM | POA: Diagnosis not present

## 2017-11-28 DIAGNOSIS — E876 Hypokalemia: Secondary | ICD-10-CM | POA: Diagnosis not present

## 2017-11-28 DIAGNOSIS — N2581 Secondary hyperparathyroidism of renal origin: Secondary | ICD-10-CM | POA: Diagnosis not present

## 2017-11-30 ENCOUNTER — Emergency Department (HOSPITAL_COMMUNITY): Payer: Medicare Other

## 2017-11-30 ENCOUNTER — Inpatient Hospital Stay (HOSPITAL_COMMUNITY)
Admission: EM | Admit: 2017-11-30 | Discharge: 2017-12-14 | DRG: 871 | Disposition: A | Discharge status: Home Health | Payer: Medicare Other | Attending: Internal Medicine | Admitting: Internal Medicine

## 2017-11-30 ENCOUNTER — Encounter (HOSPITAL_COMMUNITY): Payer: Self-pay | Admitting: Emergency Medicine

## 2017-11-30 DIAGNOSIS — Z8674 Personal history of sudden cardiac arrest: Secondary | ICD-10-CM

## 2017-11-30 DIAGNOSIS — D631 Anemia in chronic kidney disease: Secondary | ICD-10-CM | POA: Diagnosis not present

## 2017-11-30 DIAGNOSIS — R791 Abnormal coagulation profile: Secondary | ICD-10-CM | POA: Diagnosis not present

## 2017-11-30 DIAGNOSIS — I482 Chronic atrial fibrillation: Secondary | ICD-10-CM | POA: Diagnosis present

## 2017-11-30 DIAGNOSIS — Z79899 Other long term (current) drug therapy: Secondary | ICD-10-CM

## 2017-11-30 DIAGNOSIS — R000 Tachycardia, unspecified: Secondary | ICD-10-CM

## 2017-11-30 DIAGNOSIS — Z7901 Long term (current) use of anticoagulants: Secondary | ICD-10-CM

## 2017-11-30 DIAGNOSIS — I700 Atherosclerosis of aorta: Secondary | ICD-10-CM | POA: Diagnosis present

## 2017-11-30 DIAGNOSIS — L449 Papulosquamous disorder, unspecified: Secondary | ICD-10-CM | POA: Diagnosis present

## 2017-11-30 DIAGNOSIS — J189 Pneumonia, unspecified organism: Secondary | ICD-10-CM | POA: Diagnosis present

## 2017-11-30 DIAGNOSIS — R072 Precordial pain: Secondary | ICD-10-CM | POA: Diagnosis not present

## 2017-11-30 DIAGNOSIS — R6510 Systemic inflammatory response syndrome (SIRS) of non-infectious origin without acute organ dysfunction: Secondary | ICD-10-CM | POA: Diagnosis present

## 2017-11-30 DIAGNOSIS — R509 Fever, unspecified: Secondary | ICD-10-CM | POA: Diagnosis present

## 2017-11-30 DIAGNOSIS — I4891 Unspecified atrial fibrillation: Secondary | ICD-10-CM | POA: Diagnosis present

## 2017-11-30 DIAGNOSIS — E119 Type 2 diabetes mellitus without complications: Secondary | ICD-10-CM

## 2017-11-30 DIAGNOSIS — I132 Hypertensive heart and chronic kidney disease with heart failure and with stage 5 chronic kidney disease, or end stage renal disease: Secondary | ICD-10-CM | POA: Diagnosis present

## 2017-11-30 DIAGNOSIS — I5022 Chronic systolic (congestive) heart failure: Secondary | ICD-10-CM | POA: Diagnosis present

## 2017-11-30 DIAGNOSIS — Z87891 Personal history of nicotine dependence: Secondary | ICD-10-CM

## 2017-11-30 DIAGNOSIS — N186 End stage renal disease: Secondary | ICD-10-CM | POA: Diagnosis present

## 2017-11-30 DIAGNOSIS — R05 Cough: Secondary | ICD-10-CM

## 2017-11-30 DIAGNOSIS — Z992 Dependence on renal dialysis: Secondary | ICD-10-CM | POA: Diagnosis not present

## 2017-11-30 DIAGNOSIS — E785 Hyperlipidemia, unspecified: Secondary | ICD-10-CM | POA: Diagnosis present

## 2017-11-30 DIAGNOSIS — I452 Bifascicular block: Secondary | ICD-10-CM | POA: Diagnosis present

## 2017-11-30 DIAGNOSIS — Z953 Presence of xenogenic heart valve: Secondary | ICD-10-CM

## 2017-11-30 DIAGNOSIS — I959 Hypotension, unspecified: Secondary | ICD-10-CM | POA: Diagnosis present

## 2017-11-30 DIAGNOSIS — E669 Obesity, unspecified: Secondary | ICD-10-CM | POA: Diagnosis present

## 2017-11-30 DIAGNOSIS — R079 Chest pain, unspecified: Secondary | ICD-10-CM | POA: Diagnosis not present

## 2017-11-30 DIAGNOSIS — I4510 Unspecified right bundle-branch block: Secondary | ICD-10-CM | POA: Diagnosis present

## 2017-11-30 DIAGNOSIS — G9340 Encephalopathy, unspecified: Secondary | ICD-10-CM | POA: Diagnosis present

## 2017-11-30 DIAGNOSIS — K219 Gastro-esophageal reflux disease without esophagitis: Secondary | ICD-10-CM | POA: Diagnosis present

## 2017-11-30 DIAGNOSIS — E1122 Type 2 diabetes mellitus with diabetic chronic kidney disease: Secondary | ICD-10-CM | POA: Diagnosis present

## 2017-11-30 DIAGNOSIS — J9610 Chronic respiratory failure, unspecified whether with hypoxia or hypercapnia: Secondary | ICD-10-CM | POA: Diagnosis not present

## 2017-11-30 DIAGNOSIS — R402413 Glasgow coma scale score 13-15, at hospital admission: Secondary | ICD-10-CM | POA: Diagnosis present

## 2017-11-30 DIAGNOSIS — J90 Pleural effusion, not elsewhere classified: Secondary | ICD-10-CM

## 2017-11-30 DIAGNOSIS — B9729 Other coronavirus as the cause of diseases classified elsewhere: Secondary | ICD-10-CM | POA: Diagnosis present

## 2017-11-30 DIAGNOSIS — Z951 Presence of aortocoronary bypass graft: Secondary | ICD-10-CM

## 2017-11-30 DIAGNOSIS — E1169 Type 2 diabetes mellitus with other specified complication: Secondary | ICD-10-CM | POA: Diagnosis present

## 2017-11-30 DIAGNOSIS — J9811 Atelectasis: Secondary | ICD-10-CM | POA: Diagnosis not present

## 2017-11-30 DIAGNOSIS — L280 Lichen simplex chronicus: Secondary | ICD-10-CM | POA: Diagnosis present

## 2017-11-30 DIAGNOSIS — A4189 Other specified sepsis: Secondary | ICD-10-CM | POA: Diagnosis not present

## 2017-11-30 DIAGNOSIS — E8770 Fluid overload, unspecified: Secondary | ICD-10-CM | POA: Diagnosis present

## 2017-11-30 DIAGNOSIS — D693 Immune thrombocytopenic purpura: Secondary | ICD-10-CM | POA: Diagnosis present

## 2017-11-30 DIAGNOSIS — I2510 Atherosclerotic heart disease of native coronary artery without angina pectoris: Secondary | ICD-10-CM | POA: Diagnosis present

## 2017-11-30 DIAGNOSIS — I272 Pulmonary hypertension, unspecified: Secondary | ICD-10-CM | POA: Diagnosis present

## 2017-11-30 DIAGNOSIS — K7460 Unspecified cirrhosis of liver: Secondary | ICD-10-CM | POA: Diagnosis present

## 2017-11-30 DIAGNOSIS — Z794 Long term (current) use of insulin: Secondary | ICD-10-CM

## 2017-11-30 DIAGNOSIS — G4733 Obstructive sleep apnea (adult) (pediatric): Secondary | ICD-10-CM | POA: Diagnosis present

## 2017-11-30 DIAGNOSIS — IMO0001 Reserved for inherently not codable concepts without codable children: Secondary | ICD-10-CM

## 2017-11-30 DIAGNOSIS — G8929 Other chronic pain: Secondary | ICD-10-CM | POA: Diagnosis present

## 2017-11-30 DIAGNOSIS — N2581 Secondary hyperparathyroidism of renal origin: Secondary | ICD-10-CM | POA: Diagnosis present

## 2017-11-30 DIAGNOSIS — R059 Cough, unspecified: Secondary | ICD-10-CM

## 2017-11-30 DIAGNOSIS — J45909 Unspecified asthma, uncomplicated: Secondary | ICD-10-CM | POA: Diagnosis present

## 2017-11-30 DIAGNOSIS — E876 Hypokalemia: Secondary | ICD-10-CM | POA: Diagnosis not present

## 2017-11-30 DIAGNOSIS — R0602 Shortness of breath: Secondary | ICD-10-CM

## 2017-11-30 DIAGNOSIS — E861 Hypovolemia: Secondary | ICD-10-CM | POA: Diagnosis not present

## 2017-11-30 DIAGNOSIS — I480 Paroxysmal atrial fibrillation: Secondary | ICD-10-CM | POA: Diagnosis present

## 2017-11-30 DIAGNOSIS — M109 Gout, unspecified: Secondary | ICD-10-CM | POA: Diagnosis present

## 2017-11-30 DIAGNOSIS — Z9981 Dependence on supplemental oxygen: Secondary | ICD-10-CM

## 2017-11-30 DIAGNOSIS — Z6822 Body mass index (BMI) 22.0-22.9, adult: Secondary | ICD-10-CM

## 2017-11-30 LAB — BASIC METABOLIC PANEL
Anion gap: 9 (ref 5–15)
BUN: 15 mg/dL (ref 6–20)
CALCIUM: 8.1 mg/dL — AB (ref 8.9–10.3)
CO2: 27 mmol/L (ref 22–32)
Chloride: 101 mmol/L (ref 101–111)
Creatinine, Ser: 2.58 mg/dL — ABNORMAL HIGH (ref 0.61–1.24)
GFR, EST AFRICAN AMERICAN: 30 mL/min — AB (ref >60)
GFR, EST NON AFRICAN AMERICAN: 26 mL/min — AB (ref >60)
Glucose, Bld: 91 mg/dL (ref 65–99)
Potassium: 4.4 mmol/L (ref 3.5–5.1)
SODIUM: 137 mmol/L (ref 135–145)

## 2017-11-30 LAB — PROTIME-INR
INR: 1.92
PROTHROMBIN TIME: 21.8 s — AB (ref 11.4–15.2)

## 2017-11-30 LAB — I-STAT TROPONIN, ED
TROPONIN I, POC: 0.02 ng/mL (ref 0.00–0.08)
Troponin i, poc: 0.02 ng/mL (ref 0.00–0.08)

## 2017-11-30 LAB — HEPATIC FUNCTION PANEL
ALBUMIN: 3.1 g/dL — AB (ref 3.5–5.0)
ALT: 16 U/L — ABNORMAL LOW (ref 17–63)
AST: 48 U/L — AB (ref 15–41)
Alkaline Phosphatase: 184 U/L — ABNORMAL HIGH (ref 38–126)
Bilirubin, Direct: 1.3 mg/dL — ABNORMAL HIGH (ref 0.1–0.5)
Indirect Bilirubin: 1.2 mg/dL — ABNORMAL HIGH (ref 0.3–0.9)
TOTAL PROTEIN: 8 g/dL (ref 6.5–8.1)
Total Bilirubin: 2.5 mg/dL — ABNORMAL HIGH (ref 0.3–1.2)

## 2017-11-30 LAB — AMMONIA: AMMONIA: 50 umol/L — AB (ref 9–35)

## 2017-11-30 LAB — CBC
HCT: 33 % — ABNORMAL LOW (ref 39.0–52.0)
Hemoglobin: 11.1 g/dL — ABNORMAL LOW (ref 13.0–17.0)
MCH: 26.2 pg (ref 26.0–34.0)
MCHC: 33.6 g/dL (ref 30.0–36.0)
MCV: 78 fL (ref 78.0–100.0)
Platelets: 56 10*3/uL — ABNORMAL LOW (ref 150–400)
RBC: 4.23 MIL/uL (ref 4.22–5.81)
RDW: 18.8 % — ABNORMAL HIGH (ref 11.5–15.5)
WBC: 7.1 10*3/uL (ref 4.0–10.5)

## 2017-11-30 LAB — LIPASE, BLOOD: LIPASE: 69 U/L — AB (ref 11–51)

## 2017-11-30 LAB — I-STAT CG4 LACTIC ACID, ED
LACTIC ACID, VENOUS: 2.64 mmol/L — AB (ref 0.5–1.9)
Lactic Acid, Venous: 2.64 mmol/L (ref 0.5–1.9)

## 2017-11-30 NOTE — ED Triage Notes (Addendum)
Pt here 1/7 for similar. Pt arrives via [facility] from dialysis, completed 4 hours. Pt c.o. 1 hour of centralized chest pain non radiating. Pt is a current drug user. Hx of cardiac arrest. Dialysis to L arm and R chest. Pt very loud in triage, unable to answer all questions. Pt has a cough as well. In afib, hx of afib. Rate is 120s-140s. Pt was noted to be febrile on previous visit. 99.5 oral at present. Given 324 Asprin and 1 nitro, no IV access. Pt is on Coumadin.

## 2017-11-30 NOTE — ED Notes (Signed)
Please note: BP function NOT working on room monitor. Dinamap brought in to monitor Pt's BP (from pod C).

## 2017-11-30 NOTE — ED Provider Notes (Signed)
[facility] EMERGENCY DEPARTMENT Provider Note   CSN: [id number] Arrival date & time: 11/30/17  1622     History   Chief Complaint Chief Complaint  Patient presents with  . Chest Pain    HPI Michael [name] is a 57 y.o. male.  The history is provided by the patient and medical records.  Chest Pain   This is a new problem. The current episode started 1 to 2 hours ago. The problem occurs constantly. The problem has not changed since onset.The pain is present in the substernal region. The pain is at a severity of 10/10. The pain is severe. The quality of the pain is described as pleuritic, pressure-like and dull. The pain does not radiate. Duration of episode(s) is 2 hours. Associated symptoms include cough, a fever, lower extremity edema (mild), malaise/fatigue, nausea, shortness of breath, sputum production and vomiting. Pertinent negatives include no abdominal pain, no back pain, no claudication, no diaphoresis, no headaches, no near-syncope and no palpitations. He has tried nothing for the symptoms. The treatment provided no relief.    Past Medical History:  Diagnosis Date  . Anemia of chronic disease   . Asthma    said he does not know  . Chronic kidney disease (CKD), stage IV (severe) ([place])   . Chronic systolic CHF (congestive heart failure) (HCC)    a. prior EF normal; b. Echo 10/16: Mild LVH, EF 30-35%, mitral valve bioprosthesis present without evidence of stenosis or regurgitation, severe LAE, mild RVE with mild to moderately reduced RVSF, moderate RAE  . Cirrhosis ([place])   . Coronary artery disease   . Diabetes mellitus type 2 in obese ([place])   . Diabetes mellitus without complication ([place])   . Dyspnea   . GERD (gastroesophageal reflux disease)   . H/O tricuspid valve repair 2012  . History of echocardiogram    Echo at [facility] 4/12: mod LVH, EF 60-65%, mod LAE, tissue MVR ok, mod TR, mod pulmo HTN with RVSP 48 mmHg  . Hypertension   .  Obstructive sleep apnea   . Paroxysmal atrial fibrillation (HCC)    s/p Maze procedure at time of MVR in 2011  . Pulmonary HTN ([place])   . 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 Thrombocytopenia [facility])     Patient Active Problem List   Diagnosis Date Noted  . CAD (coronary artery disease) 11/24/2017  . Hyperkalemia 11/24/2017  . Fluid overload 11/24/2017  . Elevated troponin 11/24/2017  . Dyspnea 09/18/2017  . Chronic renal failure   . Advance care planning   . Pulmonary hypertension, unspecified ([place])   . Chest pain   . Biventricular heart failure ([place]) 08/10/2017  . Syncope and collapse 08/10/2017  . Cardiac arrest ([place])   . Benign neoplasm of ascending colon   . Rectal bleeding   . ESRD (end stage renal disease) on dialysis ([place])   . Goals of care, counseling/discussion   . Palliative care by specialist   . Acute kidney injury ([place]) 06/25/2017  . Anasarca 05/30/2017  . HLD (hyperlipidemia) 10/05/2016  . Mouth bleeding 10/05/2016  . Bilateral knee pain 10/05/2016  . Petechial rash 10/05/2016  . Laceration of tongue   . Insomnia 06/10/2016  . Supratherapeutic INR 06/01/2016  . Acute hepatic encephalopathy 03/15/2016  . Liver cirrhosis ([place]) 03/15/2016  . Volume overload 03/15/2016  . Acute congestive heart failure ([place])   . Insulin dependent diabetes mellitus ([place])   . Acute renal failure (ARF) (HCC)  03/14/2016  . GERD (gastroesophageal reflux disease) 03/04/2016  . Aortic regurgitation 02/27/2016  . Viral gastroenteritis 02/27/2016  . A-fib ([place]) 02/26/2016  . Abdominal pain 02/25/2016  . Diabetes mellitus with complication ([place]) 16/08/9603  . Permanent atrial fibrillation ([place]) 02/25/2016  . Acute on chronic systolic CHF (congestive heart failure) ([place]) 02/25/2016  . Anemia, chronic disease 02/25/2016  . Atrial fibrillation with RVR ([place]) 02/19/2016  . Pain in joint, ankle and foot 01/10/2016  . Asthma   . Acute hypoxemic respiratory failure  ([place]) 12/29/2015  . Acute respiratory failure ([place]) 12/29/2015  . Chronic atrial fibrillation ([place]) 12/24/2015  . Dilated cardiomyopathy ([place]) 12/24/2015  . Polypharmacy 11/23/2015  . Gout 10/23/2015  . Encounter for therapeutic drug monitoring 10/19/2015  . Anemia - mild exacerbation 10/15/2015  . Tricuspid valve disease 10/09/2015  . Mitral valve disease   . Calf pain 09/10/2015  . Type II diabetes mellitus with renal manifestations ([place]) 09/10/2015  . Essential hypertension 09/10/2015    Past Surgical History:  Procedure Laterality Date  . AV FISTULA PLACEMENT Left 07/13/2017   Procedure: LEFT ARM ARTERIOVENOUS (AV) FISTULA CREATION;  Surgeon: Waynetta Sandy, MD;  Location: [facility];  Service: Vascular;  Laterality: Left;  . BASCILIC VEIN TRANSPOSITION Left 11/03/2017   Procedure: BASILIC VEIN TRANSPOSITION SECOND STAGE;  Surgeon: Waynetta Sandy, MD;  Location: [facility];  Service: Vascular;  Laterality: Left;  . CARDIAC SURGERY    . CARDIAC VALVE REPLACEMENT  2011  . CARDIOVERSION N/A 07/09/2017   Procedure: CARDIOVERSION;  Surgeon: Larey Dresser, MD;  Location[facility] ENDOSCOPY;  Service: Cardiovascular;  Laterality: N/A;  . COLONOSCOPY N/A 07/16/2017   Procedure: COLONOSCOPY;  Surgeon: Jerene Bears, MD;  Location: [facility];  Service: Gastroenterology;  Laterality: N/A;  . ESOPHAGOGASTRODUODENOSCOPY N/A 07/16/2017   Procedure: ESOPHAGOGASTRODUODENOSCOPY (EGD);  Surgeon: Jerene Bears, MD;  Location: [facility] ENDOSCOPY;  Service: Gastroenterology;  Laterality: N/A;  . INSERTION OF DIALYSIS CATHETER Right 07/13/2017   Procedure: INSERTION OF DIALYSIS CATHETER RIGHT INTERNAL JUGULAR;  Surgeon: Waynetta Sandy, MD;  Location: [facility];  Service: Vascular;  Laterality: Right;  . RIGHT HEART CATH N/A 06/29/2017   Procedure: RIGHT HEART CATH;  Surgeon: Larey Dresser, MD;  Location: [facility] CV LAB;  Service: Cardiovascular;  Laterality: N/A;  . RIGHT/LEFT HEART CATH  AND CORONARY ANGIOGRAPHY N/A 08/19/2017   Procedure: RIGHT/LEFT HEART CATH AND CORONARY ANGIOGRAPHY;  Surgeon: Leonie Man, MD;  Location: [facility] CV LAB;  Service: Cardiovascular;  Laterality: N/A;  . TEE WITHOUT CARDIOVERSION N/A 07/09/2017   Procedure: TRANSESOPHAGEAL ECHOCARDIOGRAM (TEE);  Surgeon: Larey Dresser, MD;  Location: [facility] ENDOSCOPY;  Service: Cardiovascular;  Laterality: N/A;       Home Medications    Prior to Admission medications   Medication Sig Start Date End Date Taking? Authorizing Provider  acetaminophen (TYLENOL) 325 MG tablet Take 650 mg by mouth every 6 (six) hours.     [provider]  acetaminophen-codeine (TYLENOL #3) 300-30 MG tablet Take 1 tablet by mouth every 6 (six) hours as needed for moderate pain. 11/19/17   Argentina Donovan, PA-C  allopurinol (ZYLOPRIM) 300 MG tablet Take 0.5 tablets (150 mg total) by mouth daily. 07/28/17   Shirley Friar, PA-C  atorvastatin (LIPITOR) 20 MG tablet Take 1 tablet (20 mg total) by mouth daily. 07/28/17   Shirley Friar, PA-C  benzonatate (TESSALON) 100 MG capsule Take 1 capsule (100 mg total) by mouth 3 (three) times daily. 11/26/17  Marene Lenz, MD  calcium acetate (PHOSLO) 667 MG capsule Take 1 capsule (667 mg total) by mouth 3 (three) times daily with meals. Patient taking differently: Take 667-2,001 mg by mouth See admin instructions. Pt takes 2,001mg  by mouth three times daily and 667mg  twice daily with snacks 07/27/17   Shirley Friar, PA-C  colchicine 0.6 MG tablet Take 0.5 tablets (0.3 mg total) by mouth 2 (two) times a week. 09/21/17   Tawny Asal, MD  diclofenac sodium (VOLTAREN) 1 % GEL Apply 4 g topically 4 (four) times daily as needed (pain).     [provider]  docusate sodium (COLACE) 100 MG capsule Take 1 capsule (100 mg total) by mouth 2 (two) times daily. 07/27/17   Shirley Friar, PA-C  folic acid (FOLVITE) 1 MG tablet TAKE 1 TABLET BY MOUTH  DAILY. Patient taking differently: TAKE 1 TABLET (1mg ) BY MOUTH DAILY. 11/12/17   [name], MD  hydrOXYzine (ATARAX/VISTARIL) 25 MG tablet 1/2-1 tablets tid prn itching Patient taking differently: Take 25 mg by mouth 2 (two) times daily as needed for itching. 1/2-1 tablets tid prn itching 11/19/17   Argentina Donovan, PA-C  lactulose (CHRONULAC) 10 GM/15ML solution Take 30 mLs (20 g total) by mouth 2 (two) times daily for 7 days. 11/26/17 12/03/17  Marene Lenz, MD  metoprolol succinate (TOPROL-XL) 25 MG 24 hr tablet Take 1 tablet (25 mg total) by mouth 2 (two) times daily. Patient taking differently: Take 25 mg by mouth 2 (two) times daily.  07/27/17   Shirley Friar, PA-C  midodrine (PROAMATINE) 5 MG tablet Take 2 tablets (10 mg total) by mouth 3 (three) times daily with meals. 09/06/17   Kalman Shan Ratliff, DO  omeprazole (PRILOSEC) 20 MG capsule Take 1 capsule (20 mg total) by mouth daily. 03/03/17   [name], MD  oxycodone (OXY-IR) 5 MG capsule Take 5 mg by mouth every 8 (eight) hours as needed for pain.    [provider]  OXYGEN Inhale 2 L into the lungs daily as needed.     [provider]  tiZANidine (ZANAFLEX) 4 MG tablet Take 1 tablet (4 mg total) by mouth every 8 (eight) hours as needed for muscle spasms. 08/04/17   [name], MD  warfarin (COUMADIN) 2.5 MG tablet Take 1 tablet (2.5 mg total) by mouth daily at 6 PM. Or as directed by coumadin clinic Patient taking differently: Take 2.5-3.75 mg by mouth See admin instructions. 3.75 mg every Wed and Sat and 2.5 mg all other days OR as directed by coumadin clinic 10/15/17   Fay Records, MD    Family History Family History  Problem Relation Age of Onset  . Heart attack Neg Hx     Social History Social History   Tobacco Use  . Smoking status: Former Smoker    Packs/day: 1.00    Types: Cigarettes  . Smokeless tobacco: Never Used  Substance Use Topics  . Alcohol use: No  . Drug use:  No     Allergies   Patient has no known allergies.   Review of Systems Review of Systems  Constitutional: Positive for chills, fatigue, fever and malaise/fatigue. Negative for diaphoresis.  HENT: Negative for congestion.   Respiratory: Positive for cough, sputum production, chest tightness and shortness of breath. Negative for wheezing and stridor.   Cardiovascular: Positive for chest pain and leg swelling (mild). Negative for palpitations, claudication and near-syncope.  Gastrointestinal: Positive for nausea and vomiting. Negative for  abdominal pain, constipation and diarrhea.  Genitourinary: Negative for dysuria and frequency.  Musculoskeletal: Negative for back pain, neck pain and neck stiffness.  Neurological: Negative for light-headedness and headaches.  Psychiatric/Behavioral: Negative for agitation.  All other systems reviewed and are negative.    Physical Exam Updated Vital Signs BP 92/67 (BP Location: Right Arm)   Temp 99.5 F (37.5 C) (Oral)   Physical Exam  Constitutional: He appears well-developed and well-nourished.  Non-toxic appearance. He does not appear ill. No distress.  HENT:  Head: Normocephalic and atraumatic.  Mouth/Throat: Oropharynx is clear and moist.  Eyes: Conjunctivae and EOM are normal. Pupils are equal, round, and reactive to light.  Cardiovascular: An irregularly irregular rhythm present. Tachycardia present.  Murmur heard. Pulmonary/Chest: He has no wheezes. He has rhonchi. He has rales. He exhibits no tenderness.  Abdominal: He exhibits no distension. There is no tenderness.  Musculoskeletal: He exhibits no tenderness.  Neurological: He is alert. No sensory deficit. He exhibits normal muscle tone.  Skin: Capillary refill takes less than 2 seconds. He is not diaphoretic. No erythema. No pallor.  Nursing note and vitals reviewed.    ED Treatments / Results  Labs (all labs ordered are listed, but only abnormal results are displayed) Labs  Reviewed  BASIC METABOLIC PANEL - Abnormal; Notable for the following components:      Result Value   Creatinine, Ser 2.58 (*)    Calcium 8.1 (*)    GFR calc non Af Amer 26 (*)    GFR calc Af Amer 30 (*)    All other components within normal limits  CBC - Abnormal; Notable for the following components:   Hemoglobin 11.1 (*)    HCT 33.0 (*)    RDW 18.8 (*)    Platelets 56 (*)    All other components within normal limits  HEPATIC FUNCTION PANEL - Abnormal; Notable for the following components:   Albumin 3.1 (*)    AST 48 (*)    ALT 16 (*)    Alkaline Phosphatase 184 (*)    Total Bilirubin 2.5 (*)    Bilirubin, Direct 1.3 (*)    Indirect Bilirubin 1.2 (*)    All other components within normal limits  LIPASE, BLOOD - Abnormal; Notable for the following components:   Lipase 69 (*)    All other components within normal limits  PROTIME-INR - Abnormal; Notable for the following components:   Prothrombin Time 21.8 (*)    All other components within normal limits  AMMONIA - Abnormal; Notable for the following components:   Ammonia 50 (*)    All other components within normal limits  I-STAT CG4 LACTIC ACID, ED - Abnormal; Notable for the following components:   Lactic Acid, Venous 2.64 (*)    All other components within normal limits  I-STAT CG4 LACTIC ACID, ED - Abnormal; Notable for the following components:   Lactic Acid, Venous 2.64 (*)    All other components within normal limits  I-STAT CG4 LACTIC ACID, ED - Abnormal; Notable for the following components:   Lactic Acid, Venous 2.51 (*)    All other components within normal limits  CULTURE, BLOOD (ROUTINE X 2)  CULTURE, BLOOD (ROUTINE X 2)  RESPIRATORY PANEL BY PCR  INFLUENZA PANEL BY PCR (TYPE A & B)  CBC WITH DIFFERENTIAL/PLATELET  BASIC METABOLIC PANEL  I-STAT TROPONIN, ED  I-STAT TROPONIN, ED    EKG  EKG Interpretation  Date/Time:  Monday November 30 2017 16:24:46 EST Ventricular Rate:  125 PR Interval:    QRS  Duration: 113 QT Interval:  321 QTC Calculation: 463 R Axis:   -58 Text Interpretation:  Atrial fibrillation Ventricular premature complex Incomplete RBBB and LAFB Abnormal R-wave progression, late transition Nonspecific repol abnormality, lateral leads When compared to prior, afib with RVR. Similar to prior.  No STEMI Confirmed by Antony Blackbird [phone]) on 11/30/2017 4:39:40 PM       Radiology Dg Chest Portable 1 View  Result Date: 11/30/2017 CLINICAL DATA:  Chest pain EXAM: PORTABLE CHEST 1 VIEW COMPARISON:  11/23/2017 FINDINGS: Large-bore RIGHT central venous line. Sternotomy wires overlie enlarged cardiac silhouette. There is improvement in bilateral pleural effusions. Mild interstitial edema remains. IMPRESSION: Mild interstitial edema.  Improved vascular pattern from 11/23/2017. Electronically Signed   By: Suzy Bouchard M.D.   On: 11/30/2017 17:16    Procedures Procedures (including critical care time)  Medications Ordered in ED Medications  vancomycin (VANCOCIN) 1,250 mg in sodium chloride 0.9 % 250 mL IVPB (not administered)  midodrine (PROAMATINE) tablet 10 mg (not administered)  benzonatate (TESSALON) capsule 200 mg (not administered)  guaiFENesin-dextromethorphan (ROBITUSSIN DM) 100-10 MG/5ML syrup 5 mL (not administered)  vancomycin (VANCOCIN) IVPB 750 mg/150 ml premix (not administered)  acetaminophen-codeine (TYLENOL #3) 300-30 MG per tablet 1 tablet (not administered)  allopurinol (ZYLOPRIM) tablet 150 mg (not administered)  atorvastatin (LIPITOR) tablet 20 mg (not administered)  calcium acetate (PHOSLO) capsule 667-2,001 mg (not administered)  diclofenac sodium (VOLTAREN) 1 % transdermal gel 4 g (not administered)  docusate sodium (COLACE) capsule 100 mg (not administered)  folic acid (FOLVITE) tablet 1 mg (not administered)  hydrOXYzine (ATARAX/VISTARIL) tablet 12.5-25 mg (not administered)  midodrine (PROAMATINE) tablet 10 mg (not administered)  pantoprazole  (PROTONIX) EC tablet 40 mg (not administered)  oxyCODONE (Oxy IR/ROXICODONE) immediate release tablet 5 mg (not administered)  tiZANidine (ZANAFLEX) tablet 4 mg (not administered)  sodium chloride flush (NS) 0.9 % injection 3 mL (not administered)  sodium chloride flush (NS) 0.9 % injection 3 mL (not administered)  sodium chloride flush (NS) 0.9 % injection 3 mL (not administered)  0.9 %  sodium chloride infusion (not administered)  acetaminophen (TYLENOL) tablet 650 mg (not administered)    Or  acetaminophen (TYLENOL) suppository 650 mg (not administered)  ceFEPIme (MAXIPIME) 1 g in dextrose 5 % 50 mL IVPB (1 g Intravenous New Bag/Given 12/01/17 0115)     Initial Impression / Assessment and Plan / ED Course  I have reviewed the triage vital signs and the nursing notes.  Pertinent labs & imaging results that were available during my care of the patient were reviewed by me and considered in my medical decision making (see chart for details).     Davi Cretella is a 57 y.o. male with a past medical history significant for hypertension, atrial fibrillation on Coumadin therapy, ESRD on dialysis MWFS, GERD, asthma, CHF, cirrhosis, prior hepatic encephalopathy, and cardiac arrest with discharged from the hospital 4 days ago for fluid overload who presents from dialysis for chest pain, shortness of breath, productive cough, and chills.  Patient reports that he was in the midst of getting dialysis today when he could not complete the treatment having pressure and crushing central chest pain.  He reports associated shortness of breath with nausea, vomiting, and chills.  He reports that he has had a productive cough today with a phlegm-like sputum.  He has not taken his temperature at home.  He denies any severe back pain abdominal pain or extremity pains.  He  denies any recent trauma.  He denies any urinary symptoms, constipation, or diarrhea.  He describes his chest pain as 10 out of 10 in severity  on arrival.  On exam, lungs had crackles and coarse breath sounds bilaterally.  Patient had murmur and was tachycardic with an A. fib with a rate in the 120s-130s on arrival.  Chest was nontender.  Abdomen was nontender.  Patient had some mild edema in his lower extremities.  Patient moving all external pulses in upper extremities.  Patient had no tenderness around his dialysis catheter site in his right upper chest.  Patient had tactile warmth on exam, will obtain rectal temp.  EKG was performed showing atrial fibrillation with a rate in the 120s.  No evidence of STEMI.  Patient will have diagnostic laboratory testing and imaging obtained to determine etiology of symptoms.  Given tactile warmth, I am concerned about pneumonia or other infection.  Anticipate reassessing patient after diagnostic testing.  10:31 PM Diagnostic testing results are seen above.  Lactic acid was initially 2.6 and then was rechecked at 2.6 without changing.  It is elevated.  Hepatic function showed similar abnormalities to prior.  Lipase is elevated.  Ammonia slightly elevated.  Metabolic panel showed that potassium is not elevated and creatinine is improved at 2.5.  No leukocytosis.  Initial troponin is improved and negative.  Mild interstitial edema was seen on chest x-ray however it appeared improved from before when he needed admission and dialysis.  Nephrology was called and they recommended patient have delta troponin for consideration of discharge home.  Patient remains having chest pain and short of breath.  Patient is on 2 L nasal cannula which he says he sometimes uses at home.  Patient was found to have a fever.  Although chest x-ray showed no pneumonia, I am still concerned about a respiratory infection contributing to his symptoms.  As patient is borderline fluid overloaded, we will not give fluids for the lactic acid at this time.  Anticipate reassessment after second troponin.  Flu test was also added.  Delta  troponin was negative.  11:50 PM On reassessment, patient reports his chest pain and shortness of breath have improved he is to continue to have coughing fits.  Unfortunately now, his heart rate is in the 130s sustained.  I am concerned about him going home with his tachycardia as I do not want to give him fluids due to the fluid overload.  Due to concern for tachycardia and fluid balance in the setting of needing dialysis, hospitalist team will be called for admission for dialysis likely tomorrow.  11:55 PM Dr. Posey Pronto with nephrology team was recalled and after the patient has demonstrated continued tachycardia with limited ability for rehydration and needing dialysis tomorrow, the nephrology team recommended admission to hospital service for dialysis tomorrow and monitoring of his heart rate.  Hospitalist team will be called for admission.  12:19 AM On reassessment, patient is now hypotensive.  Blood pressures have been in the 80s.  Patient will have broad-spectrum antibiotics and be treated for likely pneumonia causing the fever, tachycardia in the 130s, tachypnea, soft pressures, and chest pain/cough/shortness of breath.  Patient will be admitted. Due to concern for fluid overload, will hold on significant fluids however if lactic acid begins to rise, would consider gentle hydration.     Final Clinical Impressions(s) / ED Diagnoses   Final diagnoses:  Tachycardia  Precordial pain  Fever, unspecified fever cause  Cough  Shortness of breath  Clinical Impression: 1. Tachycardia   2. Precordial pain   3. Fever, unspecified fever cause   4. Cough   5. Shortness of breath     Disposition: Admit  This note was prepared with assistance of Dragon voice recognition software. Occasional wrong-word or sound-a-like substitutions may have occurred due to the inherent limitations of voice recognition software.      Tegeler, Gwenyth Allegra, MD 12/01/17 [phone]

## 2017-11-30 NOTE — ED Notes (Signed)
Elevated CG-4 of 2.64 reported to Dr. Sherry Ruffing

## 2017-12-01 ENCOUNTER — Encounter (HOSPITAL_COMMUNITY): Payer: Self-pay | Admitting: Family Medicine

## 2017-12-01 ENCOUNTER — Inpatient Hospital Stay: Payer: Medicare Other | Admitting: Family Medicine

## 2017-12-01 DIAGNOSIS — G9340 Encephalopathy, unspecified: Secondary | ICD-10-CM | POA: Diagnosis present

## 2017-12-01 DIAGNOSIS — I132 Hypertensive heart and chronic kidney disease with heart failure and with stage 5 chronic kidney disease, or end stage renal disease: Secondary | ICD-10-CM | POA: Diagnosis present

## 2017-12-01 DIAGNOSIS — I2510 Atherosclerotic heart disease of native coronary artery without angina pectoris: Secondary | ICD-10-CM | POA: Diagnosis not present

## 2017-12-01 DIAGNOSIS — R079 Chest pain, unspecified: Secondary | ICD-10-CM | POA: Diagnosis not present

## 2017-12-01 DIAGNOSIS — B9729 Other coronavirus as the cause of diseases classified elsewhere: Secondary | ICD-10-CM | POA: Diagnosis present

## 2017-12-01 DIAGNOSIS — K7460 Unspecified cirrhosis of liver: Secondary | ICD-10-CM | POA: Diagnosis present

## 2017-12-01 DIAGNOSIS — I481 Persistent atrial fibrillation: Secondary | ICD-10-CM | POA: Diagnosis not present

## 2017-12-01 DIAGNOSIS — R6510 Systemic inflammatory response syndrome (SIRS) of non-infectious origin without acute organ dysfunction: Secondary | ICD-10-CM | POA: Diagnosis not present

## 2017-12-01 DIAGNOSIS — E1169 Type 2 diabetes mellitus with other specified complication: Secondary | ICD-10-CM | POA: Diagnosis present

## 2017-12-01 DIAGNOSIS — N186 End stage renal disease: Secondary | ICD-10-CM | POA: Diagnosis not present

## 2017-12-01 DIAGNOSIS — Z862 Personal history of diseases of the blood and blood-forming organs and certain disorders involving the immune mechanism: Secondary | ICD-10-CM | POA: Diagnosis not present

## 2017-12-01 DIAGNOSIS — R0789 Other chest pain: Secondary | ICD-10-CM

## 2017-12-01 DIAGNOSIS — I272 Pulmonary hypertension, unspecified: Secondary | ICD-10-CM | POA: Diagnosis present

## 2017-12-01 DIAGNOSIS — R846 Abnormal cytological findings in specimens from respiratory organs and thorax: Secondary | ICD-10-CM | POA: Diagnosis not present

## 2017-12-01 DIAGNOSIS — Z9981 Dependence on supplemental oxygen: Secondary | ICD-10-CM | POA: Diagnosis not present

## 2017-12-01 DIAGNOSIS — I482 Chronic atrial fibrillation: Secondary | ICD-10-CM | POA: Diagnosis not present

## 2017-12-01 DIAGNOSIS — I4891 Unspecified atrial fibrillation: Secondary | ICD-10-CM

## 2017-12-01 DIAGNOSIS — J9610 Chronic respiratory failure, unspecified whether with hypoxia or hypercapnia: Secondary | ICD-10-CM | POA: Diagnosis present

## 2017-12-01 DIAGNOSIS — J9811 Atelectasis: Secondary | ICD-10-CM | POA: Diagnosis not present

## 2017-12-01 DIAGNOSIS — Z8674 Personal history of sudden cardiac arrest: Secondary | ICD-10-CM | POA: Diagnosis not present

## 2017-12-01 DIAGNOSIS — D693 Immune thrombocytopenic purpura: Secondary | ICD-10-CM | POA: Diagnosis present

## 2017-12-01 DIAGNOSIS — E8770 Fluid overload, unspecified: Secondary | ICD-10-CM | POA: Diagnosis not present

## 2017-12-01 DIAGNOSIS — I480 Paroxysmal atrial fibrillation: Secondary | ICD-10-CM | POA: Diagnosis present

## 2017-12-01 DIAGNOSIS — M109 Gout, unspecified: Secondary | ICD-10-CM | POA: Diagnosis present

## 2017-12-01 DIAGNOSIS — J189 Pneumonia, unspecified organism: Secondary | ICD-10-CM | POA: Diagnosis present

## 2017-12-01 DIAGNOSIS — I5022 Chronic systolic (congestive) heart failure: Secondary | ICD-10-CM | POA: Diagnosis present

## 2017-12-01 DIAGNOSIS — Z992 Dependence on renal dialysis: Secondary | ICD-10-CM | POA: Diagnosis not present

## 2017-12-01 DIAGNOSIS — R0602 Shortness of breath: Secondary | ICD-10-CM | POA: Diagnosis not present

## 2017-12-01 DIAGNOSIS — A4189 Other specified sepsis: Secondary | ICD-10-CM | POA: Diagnosis present

## 2017-12-01 DIAGNOSIS — D631 Anemia in chronic kidney disease: Secondary | ICD-10-CM | POA: Diagnosis not present

## 2017-12-01 DIAGNOSIS — I959 Hypotension, unspecified: Secondary | ICD-10-CM | POA: Diagnosis present

## 2017-12-01 DIAGNOSIS — E1122 Type 2 diabetes mellitus with diabetic chronic kidney disease: Secondary | ICD-10-CM | POA: Diagnosis not present

## 2017-12-01 DIAGNOSIS — J90 Pleural effusion, not elsewhere classified: Secondary | ICD-10-CM | POA: Diagnosis not present

## 2017-12-01 DIAGNOSIS — I452 Bifascicular block: Secondary | ICD-10-CM | POA: Diagnosis present

## 2017-12-01 DIAGNOSIS — N2581 Secondary hyperparathyroidism of renal origin: Secondary | ICD-10-CM | POA: Diagnosis present

## 2017-12-01 LAB — CBC WITH DIFFERENTIAL/PLATELET
BASOS ABS: 0 10*3/uL (ref 0.0–0.1)
Basophils Relative: 1 %
EOS ABS: 0.4 10*3/uL (ref 0.0–0.7)
Eosinophils Relative: 5 %
HCT: 30.8 % — ABNORMAL LOW (ref 39.0–52.0)
HEMOGLOBIN: 10.3 g/dL — AB (ref 13.0–17.0)
LYMPHS PCT: 23 %
Lymphs Abs: 1.7 10*3/uL (ref 0.7–4.0)
MCH: 25.8 pg — ABNORMAL LOW (ref 26.0–34.0)
MCHC: 33.4 g/dL (ref 30.0–36.0)
MCV: 77 fL — ABNORMAL LOW (ref 78.0–100.0)
Monocytes Absolute: 1.1 10*3/uL — ABNORMAL HIGH (ref 0.1–1.0)
Monocytes Relative: 14 %
NEUTROS PCT: 57 %
Neutro Abs: 4.2 10*3/uL (ref 1.7–7.7)
Platelets: 56 10*3/uL — ABNORMAL LOW (ref 150–400)
RBC: 4 MIL/uL — AB (ref 4.22–5.81)
RDW: 18.1 % — ABNORMAL HIGH (ref 11.5–15.5)
WBC: 7.4 10*3/uL (ref 4.0–10.5)

## 2017-12-01 LAB — BASIC METABOLIC PANEL
ANION GAP: 11 (ref 5–15)
BUN: 23 mg/dL — ABNORMAL HIGH (ref 6–20)
CO2: 25 mmol/L (ref 22–32)
Calcium: 8.4 mg/dL — ABNORMAL LOW (ref 8.9–10.3)
Chloride: 97 mmol/L — ABNORMAL LOW (ref 101–111)
Creatinine, Ser: 2.97 mg/dL — ABNORMAL HIGH (ref 0.61–1.24)
GFR, EST AFRICAN AMERICAN: 25 mL/min — AB (ref >60)
GFR, EST NON AFRICAN AMERICAN: 22 mL/min — AB (ref >60)
Glucose, Bld: 124 mg/dL — ABNORMAL HIGH (ref 65–99)
POTASSIUM: 4.1 mmol/L (ref 3.5–5.1)
SODIUM: 133 mmol/L — AB (ref 135–145)

## 2017-12-01 LAB — RESPIRATORY PANEL BY PCR
ADENOVIRUS-RVPPCR: NOT DETECTED
Bordetella pertussis: NOT DETECTED
CHLAMYDOPHILA PNEUMONIAE-RVPPCR: NOT DETECTED
CORONAVIRUS HKU1-RVPPCR: NOT DETECTED
CORONAVIRUS NL63-RVPPCR: NOT DETECTED
CORONAVIRUS OC43-RVPPCR: DETECTED — AB
Coronavirus 229E: NOT DETECTED
Influenza A: NOT DETECTED
Influenza B: NOT DETECTED
METAPNEUMOVIRUS-RVPPCR: NOT DETECTED
Mycoplasma pneumoniae: NOT DETECTED
PARAINFLUENZA VIRUS 1-RVPPCR: NOT DETECTED
PARAINFLUENZA VIRUS 2-RVPPCR: NOT DETECTED
PARAINFLUENZA VIRUS 3-RVPPCR: NOT DETECTED
Parainfluenza Virus 4: NOT DETECTED
Respiratory Syncytial Virus: NOT DETECTED
Rhinovirus / Enterovirus: NOT DETECTED

## 2017-12-01 LAB — PROTIME-INR
INR: 1.72
Prothrombin Time: 20 seconds — ABNORMAL HIGH (ref 11.4–15.2)

## 2017-12-01 LAB — INFLUENZA PANEL BY PCR (TYPE A & B)
Influenza A By PCR: NEGATIVE
Influenza B By PCR: NEGATIVE

## 2017-12-01 LAB — I-STAT CG4 LACTIC ACID, ED: Lactic Acid, Venous: 2.51 mmol/L (ref 0.5–1.9)

## 2017-12-01 MED ORDER — ACETAMINOPHEN 650 MG RE SUPP: 650.0000 mg | Freq: Four times a day (QID) | RECTAL | Status: DC | PRN

## 2017-12-01 MED ORDER — WARFARIN - PHARMACIST DOSING INPATIENT
Freq: Every day | Status: DC
  Administered 2017-12-05 – 2017-12-11 (×3)

## 2017-12-01 MED ORDER — BENZONATATE 100 MG PO CAPS
200.0000 mg | ORAL_CAPSULE | Freq: Three times a day (TID) | ORAL | Status: DC
  Administered 2017-12-01 – 2017-12-14 (×33): 200 mg via ORAL
  Filled 2017-12-01 (×37): qty 2

## 2017-12-01 MED ORDER — CALCIUM ACETATE (PHOS BINDER) 667 MG PO CAPS
667.0000 mg | ORAL_CAPSULE | Freq: Two times a day (BID) | ORAL | Status: DC | PRN
  Filled 2017-12-01: qty 1

## 2017-12-01 MED ORDER — MIDODRINE HCL 5 MG PO TABS
10.0000 mg | ORAL_TABLET | Freq: Three times a day (TID) | ORAL | Status: DC
  Administered 2017-12-01: 10 mg via ORAL
  Filled 2017-12-01 (×5): qty 2

## 2017-12-01 MED ORDER — SODIUM CHLORIDE 0.9% FLUSH
3.0000 mL | Freq: Two times a day (BID) | INTRAVENOUS | Status: DC
  Administered 2017-12-01 – 2017-12-12 (×11): 3 mL via INTRAVENOUS

## 2017-12-01 MED ORDER — DEXTROSE 5 % IV SOLN
1.0000 g | Freq: Once | INTRAVENOUS | Status: AC
  Administered 2017-12-01: 1 g via INTRAVENOUS
  Filled 2017-12-01: qty 1

## 2017-12-01 MED ORDER — WARFARIN SODIUM 2.5 MG PO TABS: 2.5000 mg | ORAL_TABLET | Freq: Once | ORAL | Status: DC

## 2017-12-01 MED ORDER — DILTIAZEM HCL-DEXTROSE 100-5 MG/100ML-% IV SOLN (PREMIX)
5.0000 mg/h | INTRAVENOUS | Status: DC
  Administered 2017-12-01: 10 mg/h via INTRAVENOUS
  Administered 2017-12-01: 5 mg/h via INTRAVENOUS
  Administered 2017-12-02 (×2): 10 mg/h via INTRAVENOUS
  Filled 2017-12-01 (×3): qty 100

## 2017-12-01 MED ORDER — OXYCODONE HCL 5 MG PO TABS
5.0000 mg | ORAL_TABLET | Freq: Three times a day (TID) | ORAL | Status: DC | PRN
  Administered 2017-12-08 – 2017-12-14 (×4): 5 mg via ORAL
  Filled 2017-12-01 (×4): qty 1

## 2017-12-01 MED ORDER — WARFARIN SODIUM 2.5 MG PO TABS
3.7500 mg | ORAL_TABLET | Freq: Once | ORAL | Status: AC
  Administered 2017-12-01: 3.75 mg via ORAL
  Filled 2017-12-01 (×2): qty 1

## 2017-12-01 MED ORDER — SODIUM CHLORIDE 0.9% FLUSH: 3.0000 mL | INTRAVENOUS | Status: DC | PRN

## 2017-12-01 MED ORDER — ACETAMINOPHEN 325 MG PO TABS: 650.0000 mg | ORAL_TABLET | Freq: Four times a day (QID) | ORAL | Status: DC | PRN

## 2017-12-01 MED ORDER — DOCUSATE SODIUM 100 MG PO CAPS
100.0000 mg | ORAL_CAPSULE | Freq: Two times a day (BID) | ORAL | Status: DC
  Administered 2017-12-01 – 2017-12-14 (×26): 100 mg via ORAL
  Filled 2017-12-01 (×26): qty 1

## 2017-12-01 MED ORDER — FOLIC ACID 1 MG PO TABS
1.0000 mg | ORAL_TABLET | Freq: Every day | ORAL | Status: DC
  Administered 2017-12-01: 1 mg via ORAL
  Filled 2017-12-01: qty 1

## 2017-12-01 MED ORDER — PANTOPRAZOLE SODIUM 40 MG PO TBEC
40.0000 mg | DELAYED_RELEASE_TABLET | Freq: Every day | ORAL | Status: DC
  Administered 2017-12-01 – 2017-12-14 (×13): 40 mg via ORAL
  Filled 2017-12-01 (×13): qty 1

## 2017-12-01 MED ORDER — VANCOMYCIN HCL IN DEXTROSE 750-5 MG/150ML-% IV SOLN
750.0000 mg | INTRAVENOUS | Status: DC
  Filled 2017-12-01: qty 150

## 2017-12-01 MED ORDER — DARBEPOETIN ALFA 100 MCG/0.5ML IJ SOSY
100.0000 ug | PREFILLED_SYRINGE | INTRAMUSCULAR | Status: DC
  Administered 2017-12-02: 100 ug via INTRAVENOUS
  Filled 2017-12-01: qty 0.5

## 2017-12-01 MED ORDER — PIPERACILLIN-TAZOBACTAM 3.375 G IVPB
3.3750 g | Freq: Two times a day (BID) | INTRAVENOUS | Status: DC
  Administered 2017-12-01: 3.375 g via INTRAVENOUS
  Filled 2017-12-01 (×2): qty 50

## 2017-12-01 MED ORDER — TIZANIDINE HCL 4 MG PO TABS
4.0000 mg | ORAL_TABLET | Freq: Three times a day (TID) | ORAL | Status: DC | PRN
  Administered 2017-12-02 – 2017-12-07 (×2): 4 mg via ORAL
  Filled 2017-12-01 (×2): qty 1

## 2017-12-01 MED ORDER — VANCOMYCIN HCL 10 G IV SOLR
1250.0000 mg | Freq: Once | INTRAVENOUS | Status: AC
  Administered 2017-12-01: 1250 mg via INTRAVENOUS
  Filled 2017-12-01: qty 1250

## 2017-12-01 MED ORDER — DICLOFENAC SODIUM 1 % TD GEL: 4.0000 g | Freq: Four times a day (QID) | TRANSDERMAL | Status: DC | PRN

## 2017-12-01 MED ORDER — GUAIFENESIN-DM 100-10 MG/5ML PO SYRP
5.0000 mL | ORAL_SOLUTION | ORAL | Status: DC | PRN
  Administered 2017-12-01 – 2017-12-03 (×4): 5 mL via ORAL
  Filled 2017-12-01 (×4): qty 5

## 2017-12-01 MED ORDER — CALCIUM ACETATE (PHOS BINDER) 667 MG PO CAPS
2001.0000 mg | ORAL_CAPSULE | Freq: Three times a day (TID) | ORAL | Status: DC
  Administered 2017-12-01 – 2017-12-06 (×13): 2001 mg via ORAL
  Filled 2017-12-01 (×16): qty 3

## 2017-12-01 MED ORDER — WARFARIN SODIUM 5 MG PO TABS: 5.0000 mg | ORAL_TABLET | Freq: Once | ORAL | Status: DC

## 2017-12-01 MED ORDER — ATORVASTATIN CALCIUM 20 MG PO TABS
20.0000 mg | ORAL_TABLET | Freq: Every day | ORAL | Status: DC
  Administered 2017-12-01 – 2017-12-14 (×12): 20 mg via ORAL
  Filled 2017-12-01 (×5): qty 1
  Filled 2017-12-01: qty 2
  Filled 2017-12-01 (×7): qty 1

## 2017-12-01 MED ORDER — HYDROXYZINE HCL 25 MG PO TABS
12.5000 mg | ORAL_TABLET | Freq: Three times a day (TID) | ORAL | Status: DC | PRN
  Administered 2017-12-01 – 2017-12-11 (×10): 25 mg via ORAL
  Filled 2017-12-01 (×10): qty 1

## 2017-12-01 MED ORDER — ACETAMINOPHEN-CODEINE #3 300-30 MG PO TABS
1.0000 | ORAL_TABLET | Freq: Four times a day (QID) | ORAL | Status: DC | PRN
  Administered 2017-12-02 – 2017-12-07 (×4): 1 via ORAL
  Filled 2017-12-01 (×4): qty 1

## 2017-12-01 MED ORDER — RENA-VITE PO TABS
1.0000 | ORAL_TABLET | Freq: Every day | ORAL | Status: DC
  Administered 2017-12-01 – 2017-12-13 (×13): 1 via ORAL
  Filled 2017-12-01 (×14): qty 1

## 2017-12-01 MED ORDER — SODIUM CHLORIDE 0.9% FLUSH
3.0000 mL | Freq: Two times a day (BID) | INTRAVENOUS | Status: DC
  Administered 2017-12-01 – 2017-12-13 (×19): 3 mL via INTRAVENOUS

## 2017-12-01 MED ORDER — MIDODRINE HCL 5 MG PO TABS
10.0000 mg | ORAL_TABLET | Freq: Three times a day (TID) | ORAL | Status: DC
  Administered 2017-12-01 – 2017-12-14 (×36): 10 mg via ORAL
  Filled 2017-12-01 (×34): qty 2

## 2017-12-01 MED ORDER — SODIUM CHLORIDE 0.9 % IV SOLN: 250.0000 mL | INTRAVENOUS | Status: DC | PRN

## 2017-12-01 MED ORDER — ALLOPURINOL 300 MG PO TABS
150.0000 mg | ORAL_TABLET | Freq: Every day | ORAL | Status: DC
  Administered 2017-12-02 – 2017-12-14 (×12): 150 mg via ORAL
  Filled 2017-12-01 (×13): qty 1

## 2017-12-01 MED ORDER — VANCOMYCIN HCL IN DEXTROSE 750-5 MG/150ML-% IV SOLN: 750.0000 mg | INTRAVENOUS | Status: DC

## 2017-12-01 NOTE — Progress Notes (Signed)
Patient ID: Michael [name], male   DOB: Sep 04, 1961, 57 y.o.   MRN: [id number] Aware that he is here.  Please let us know if is admitted, otherwise send to his home dialysis unit for HD on Wed.

## 2017-12-01 NOTE — Progress Notes (Signed)
Hospitalist progress note   Markeese Novacek  KAJ:[id number] DOB: 1961/09/11 DOA: 11/30/2017 PCP: [name], MD   Specialists:  Nephro  Brief Narrative:  57 year old male, ESRD MWF S-complicated by hypotension, chronic pain, atrial fibrillation chads score ~4on warfarin, hepatic cirrhosis + encephalopathy chronic thrombocytopenia, CAD status post CABG with predominant right-sided heart failure-status post bioprosthetic MVR + tricuspid repair 2012 [facility] CHLT, HLD, chronic respiratory failure, type 2 diabetes mellitus previous-colonoscopy 07/16/2017 sessile polyps status post removal/Int hemorrhoids, prior gout PEA arrest 07/2017 status post CPR-at that admission left heart cath showing chronically occluded distal RDA collateralized with no intervention needed  Recent admission 1/7-1/10 with hepatic encephalopathy and placed on lactulose at the time Returns to hospital 1/15 shortness of breath-febrile 38.9 tachycardic 110 CXR?  Edema flu negative, lactic acid 2.6 troponins normal admitted with Sirs suspected sepsis    Assessment & Plan:   Assessment:  The primary encounter diagnosis was Tachycardia. Diagnoses of Precordial pain, Fever, unspecified fever cause, Cough, and Shortness of breath were also pertinent to this visit.  Sepsis-CXR no clear pneumonia, repeat CXR later on today-rhinorrhea suggestive of acute viral URI, checking viral respiratory panel-also has catheter left chest which may be source-continue vancomycin and ceftriaxone for now -foll culture data obtained 1/15-lactic acid 2.6-2.51-admitted to stepdown If no source will need possible change of chest wall catheter-doesn't have permanent access  ESRD hypervolemia Monday Wednesday Friday Saturday-nephrology to see patient and evaluate-continue PhosLo 3 times daily,  Atrial fibrillation, mitral valve repair in 2012, CABG status post cath 07/2017 with PEA arrest, chads score >4-continue Coumadin-metoprolol held on admission as  hypotensive on admission  Chronic pain continue OxyIR as needed Voltaren  Chronic respiratory failure on oxygen-wean as tolerated  Hyperlipidemia-continue Lipitor 20 daily  Liver cirrhosis idiopathic-continue lactulose 30 mils twice daily, hydroxyzine 20 5/2-1 as needed itching 3 times daily--needs outpatient GI follow-up--Lipase was slightly high-will monitor  Reflux, prior colonic polyps-continue pantoprazole 40 daily  Gout continue 150 mg allopurinol daily, colchicine 0.5 twice  weekly   DVT prophylaxis: lovenox  Code Status:   full   Family Communication:   None present at bedsdie  Disposition Plan:  inpatient   Consultants:   Nephrology  Procedures:   ?  Antimicrobials:   Vancomycin 1/15  ceftriaxone 1/15  Subjective:  Awake alert feels a little better in nad No cp No fever no chills No cough  Objective: Vitals:   12/01/17 0255 12/01/17 0422 12/01/17 0536 12/01/17 0606  BP: 101/63  115/81 98/61  Pulse: (!) 146     Resp:      Temp:  98.9 F (37.2 C)  99.3 F (37.4 C)  TempSrc:  Oral  Oral  SpO2: 100%       Intake/Output Summary (Last 24 hours) at 12/01/2017 0708 Last data filed at 12/01/2017 0145 Gross per 24 hour  Intake 50 ml  Output -  Net 50 ml   There were no vitals filed for this visit.  Examination:  eomi ncat in nad cta b no added sound No le edema No asterixis Neuro intact moves 4 limbs equally    Data Reviewed: I have personally reviewed following labs and imaging studies  CBC: Recent Labs  Lab 11/25/17 0500 11/26/17 0831 11/30/17 1627 12/01/17 0509  WBC 7.6 6.2 7.1 7.4  NEUTROABS  --   --   --  4.2  HGB 9.1* 10.3* 11.1* 10.3*  HCT 27.9* 30.9* 33.0* 30.8*  MCV 76.9* 78.0 78.0 77.0*  PLT 117* 107* 56* 56*  Basic Metabolic Panel: Recent Labs  Lab 11/25/17 0815 11/26/17 0831 11/30/17 1627 12/01/17 0509  NA 133* 136 137 133*  K 3.2* 3.3* 4.4 4.1  CL 97* 100* 101 97*  CO2 28 27 27 25   GLUCOSE 151* 84 91 124*   BUN 12 9 15  23*  CREATININE 3.87* 3.25* 2.58* 2.97*  CALCIUM 8.7* 8.7* 8.1* 8.4*  PHOS 3.2  --   --   --    GFR: Estimated Creatinine Clearance: 23.4 mL/min (A) (by C-G formula based on SCr of 2.97 mg/dL (H)). Liver Function Tests: Recent Labs  Lab 11/25/17 0815 11/30/17 1731  AST  --  48*  ALT  --  16*  ALKPHOS  --  184*  BILITOT  --  2.5*  PROT  --  8.0  ALBUMIN 2.7* 3.1*   Recent Labs  Lab 11/30/17 1731  LIPASE 69*   Recent Labs  Lab 11/26/17 0831 11/30/17 1731  AMMONIA 39* 50*   Coagulation Profile: Recent Labs  Lab 11/25/17 0500 11/26/17 0831 11/30/17 1731 12/01/17 0509  INR 1.91 1.73 1.92 1.72   Cardiac Enzymes: Recent Labs  Lab 11/24/17 1157 11/24/17 2119  TROPONINI 0.07* 0.05*   CBG: Recent Labs  Lab 11/24/17 1010 11/24/17 1055 11/24/17 1318 11/26/17 0612  GLUCAP 57* 119* 83 128*   Urine analysis:    Component Value Date/Time   COLORURINE AMBER (A) 07/25/2017 1806   APPEARANCEUR HAZY (A) 07/25/2017 1806   LABSPEC 1.018 07/25/2017 1806   PHURINE 5.0 07/25/2017 1806   GLUCOSEU NEGATIVE 07/25/2017 1806   HGBUR MODERATE (A) 07/25/2017 1806   BILIRUBINUR NEGATIVE 07/25/2017 1806   KETONESUR NEGATIVE 07/25/2017 1806   PROTEINUR 100 (A) 07/25/2017 1806   NITRITE NEGATIVE 07/25/2017 1806   LEUKOCYTESUR NEGATIVE 07/25/2017 1806     Radiology Studies: Reviewed images personally in health database    Scheduled Meds: . [START ON 12/02/2017] allopurinol  150 mg Oral Daily  . atorvastatin  20 mg Oral Daily  . benzonatate  200 mg Oral TID  . calcium acetate  667-2,001 mg Oral See admin instructions  . docusate sodium  100 mg Oral BID  . folic acid  1 mg Oral Daily  . midodrine  10 mg Oral TID WC  . midodrine  10 mg Oral TID WC  . pantoprazole  40 mg Oral Daily  . sodium chloride flush  3 mL Intravenous Q12H  . sodium chloride flush  3 mL Intravenous Q12H  . warfarin  5 mg Oral ONCE-1800  . Warfarin - Pharmacist Dosing Inpatient   Does  not apply q1800   Continuous Infusions: . sodium chloride    . diltiazem (CARDIZEM) infusion 5 mg/hr (12/01/17 0533)  . piperacillin-tazobactam (ZOSYN)  IV    . vancomycin       LOS: 0 days    Time spent: [place], MD [facility]   If 7PM-7AM, please contact night-coverage [email or web address] Password [email or web address] 12/01/2017, 7:08 AM

## 2017-12-01 NOTE — ED Notes (Signed)
Spoke with MD Tegeler ref: low bp reading and tachycardia. MD now advises to give antibiotics and recheck a lactic acid, but declines to administer and IV fluids because of ESRD.

## 2017-12-01 NOTE — Progress Notes (Addendum)
Pharmacy Antibiotic Note + Anticoagulation Note  Michael [name] is a 57 y.o. male admitted on 11/30/2017 with chest pain. He is ESRD on HD- Tue/Thurs/Sat. Also just discharged after recent admission. Starting empiric abx for possible pneumonia. Noted low plts.  Warfarin PTA for AFib. Also has had a MVR - bioprosthetic valve. PTA warfarin dose: 5 mg po daily - last dose was 1/14 at 4pm INR 1.9  Plan: -Vancomycin 1250 mg IV x1 then 750 mg IV qHD-Tue/Thurs/Sat -Monitor renal fx, cultures, VT as needed -Zosyn 3.375 g IV q12h -Warfarin 5 mg po x1 -Daily INR     Temp (24hrs), Avg:100.4 F (38 C), Min:99.5 F (37.5 C), Max:102 F (38.9 C)  Recent Labs  Lab 11/25/17 0500 11/25/17 0815 11/26/17 0831 11/30/17 1627 11/30/17 1705 11/30/17 1933 12/01/17 0055  WBC 7.6  --  6.2 7.1  --   --   --   CREATININE  --  3.87* 3.25* 2.58*  --   --   --   LATICACIDVEN  --   --   --   --  2.64* 2.64* 2.51*    Estimated Creatinine Clearance: 26.9 mL/min (A) (by C-G formula based on SCr of 2.58 mg/dL (H)).     Antimicrobials this admission: 1/15 vancomycin > 1/15 cefepime x1 1/15 zosyn >  Dose adjustments this admission: N/A  Microbiology results: 1/14 blood cx:  Michael [name] 12/01/2017 1:11 AM

## 2017-12-01 NOTE — ED Notes (Signed)
Spoke with MD Tegeler ref: continued elevated lactic acid levels. MD declines to activate sepsis protocols at this time.

## 2017-12-01 NOTE — Progress Notes (Signed)
Subjective: Interval History: 43yr male with ESRD from DM, cardiorenal syndrome, hx MVR, MAZE, TV repair, CM, Atrial dysrhythmias, low ptlts, gout, anemia who had CP, SOB on HD yest assoc with AF and RVR.  Has ongoing low grade temp.  Here a wk ago with similar and CHF..  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 Iron Studies: No results for input(s): IRON, TIBC, TRANSFERRIN, FERRITIN in the last 72 hours.  Studies/Results: Dg Chest Portable 1 View  Result Date: 11/30/2017 CLINICAL DATA:  Chest pain EXAM: PORTABLE CHEST 1 VIEW COMPARISON:  11/23/2017 FINDINGS: Large-bore RIGHT central venous line. Sternotomy wires overlie enlarged cardiac silhouette. There is improvement in bilateral pleural effusions. Mild interstitial edema remains. IMPRESSION: Mild interstitial edema.  Improved vascular  pattern from 11/23/2017. Electronically Signed   By: Suzy Bouchard M.D.   On: 11/30/2017 17:16    I have reviewed the patient's current medications. Prior to Admission:  (Not in a hospital admission)  Assessment/Plan: 1 ESRD some vol xs. Bps ok now but low on admit . Will plan HD in am and slow vol off, and lower wgts,  Needs ideally 4 more wk on new fistula before use 2 Fevers assoc with cough.  C&S neg . If pos, remove cath.  3 DM controlled 4 Anemia add esa 5 HPTH check phos 6 Afib controlled  7 CM 8 Hx MVR/TV repair/MAZE P HD, lower vol, ? AMIO,  mido for bps.     LOS: 0 days   Jeneen Rinks Aydan Phoenix 12/01/2017,4:16 PM

## 2017-12-01 NOTE — H&P (Signed)
History and Physical    [name] Memos AYT:[id number] DOB: October 31, 1961 DOA: 11/30/2017  PCP: [name], MD   Patient coming from: Home, by way of HD center  Chief Complaint: Chest pain, SOB  HPI: Michael [name] is a 57 y.o. male with medical history significant for end-stage renal disease, chronic pain, atrial fibrillation on warfarin, and chronic thrombocytopenia, now presenting to the emergency department from his dialysis center for evaluation of chest pain and shortness of breath.  Patient reports that he had been short of breath prior to dialysis and had some mild chest pain that worsened acutely during the procedure.  Dialysis was stopped prior to completion and the patient was transported to the ED for further evaluation of this.  He reports a chronic cough that is slightly increased.  He reports recent rhinorrhea and sore throat, but denies sick contacts or recent travel.  ED Course: Upon arrival to the ED, patient is found to be febrile to 38.9 degrees C, tachycardic in the 110s, and with blood pressure of 84/58.  EKG features atrial fibrillation with PVC, incomplete RBBB, and LAFB.  Chest x-ray is notable for mild interstitial edema.  Chemistry panel reveals chronic stable hyperbilirubinemia.  Influenza PCR is negative.  CBC features a stable chronic anemia with hemoglobin of 11.1, and stable chronic thrombocytopenia with platelets 56,000.  Lactic acid is elevated to 2.64, INR is subtherapeutic at 1.92, and troponin is within normal limits x2.  Patient was treated with vancomycin and cefepime in the emergency department and nephrology was consulted by the ED physician, recommended a medical admission, and indicated that they will likely dialyze the patient in the morning.  Blood pressure remains on the low side and will be observed in the stepdown unit for ongoing evaluation and management of SIRS and pulmonary edema.  Review of Systems:  All other systems reviewed and apart from  HPI, are negative.  Past Medical History:  Diagnosis Date  . Anemia of chronic disease   . Asthma    said he does not know  . Chronic kidney disease (CKD), stage IV (severe) ([place])   . Chronic systolic CHF (congestive heart failure) (HCC)    a. prior EF normal; b. Echo 10/16: Mild LVH, EF 30-35%, mitral valve bioprosthesis present without evidence of stenosis or regurgitation, severe LAE, mild RVE with mild to moderately reduced RVSF, moderate RAE  . Cirrhosis ([place])   . Coronary artery disease   . Diabetes mellitus type 2 in obese ([place])   . Diabetes mellitus without complication ([place])   . Dyspnea   . GERD (gastroesophageal reflux disease)   . H/O tricuspid valve repair 2012  . History of echocardiogram    Echo at [facility] in [facility] 4/12: mod LVH, EF 60-65%, mod LAE, tissue MVR ok, mod TR, mod pulmo HTN with RVSP 48 mmHg  . Hypertension   . Obstructive sleep apnea   . Paroxysmal atrial fibrillation (HCC)    s/p Maze procedure at time of MVR in 2011  . Pulmonary HTN ([place])   . 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 Thrombocytopenia ([place])     Past Surgical History:  Procedure Laterality Date  . AV FISTULA PLACEMENT Left 07/13/2017   Procedure: LEFT ARM ARTERIOVENOUS (AV) FISTULA CREATION;  Surgeon: Waynetta Sandy, MD;  Location: [place];  Service: Vascular;  Laterality: Left;  . BASCILIC VEIN TRANSPOSITION Left 11/03/2017   Procedure: BASILIC VEIN TRANSPOSITION SECOND STAGE;  Surgeon: Waynetta Sandy, MD;  Location: [facility]  OR;  Service: Vascular;  Laterality: Left;  . CARDIAC SURGERY    . CARDIAC VALVE REPLACEMENT  2011  . CARDIOVERSION N/A 07/09/2017   Procedure: CARDIOVERSION;  Surgeon: Larey Dresser, MD;  Location[facility] ENDOSCOPY;  Service: Cardiovascular;  Laterality: N/A;  . COLONOSCOPY N/A 07/16/2017   Procedure: COLONOSCOPY;  Surgeon: Jerene Bears, MD;  Location: [place];  Service: Gastroenterology;  Laterality: N/A;  .  ESOPHAGOGASTRODUODENOSCOPY N/A 07/16/2017   Procedure: ESOPHAGOGASTRODUODENOSCOPY (EGD);  Surgeon: Jerene Bears, MD;  Location: [facility] ENDOSCOPY;  Service: Gastroenterology;  Laterality: N/A;  . INSERTION OF DIALYSIS CATHETER Right 07/13/2017   Procedure: INSERTION OF DIALYSIS CATHETER RIGHT INTERNAL JUGULAR;  Surgeon: Waynetta Sandy, MD;  Location: [place];  Service: Vascular;  Laterality: Right;  . RIGHT HEART CATH N/A 06/29/2017   Procedure: RIGHT HEART CATH;  Surgeon: Larey Dresser, MD;  Location: [place] CV LAB;  Service: Cardiovascular;  Laterality: N/A;  . RIGHT/LEFT HEART CATH AND CORONARY ANGIOGRAPHY N/A 08/19/2017   Procedure: RIGHT/LEFT HEART CATH AND CORONARY ANGIOGRAPHY;  Surgeon: Leonie Man, MD;  Location: [place] CV LAB;  Service: Cardiovascular;  Laterality: N/A;  . TEE WITHOUT CARDIOVERSION N/A 07/09/2017   Procedure: TRANSESOPHAGEAL ECHOCARDIOGRAM (TEE);  Surgeon: Larey Dresser, MD;  Location: [facility] ENDOSCOPY;  Service: Cardiovascular;  Laterality: N/A;     reports that he has quit smoking. His smoking use included cigarettes. He smoked 1.00 pack per day. he has never used smokeless tobacco. He reports that he does not drink alcohol or use drugs.  No Known Allergies  Family History  Problem Relation Age of Onset  . Heart attack Neg Hx      Prior to Admission medications   Medication Sig Start Date End Date Taking? Authorizing Provider  acetaminophen (TYLENOL) 325 MG tablet Take 650 mg by mouth every 6 (six) hours.     [provider]  acetaminophen-codeine (TYLENOL #3) 300-30 MG tablet Take 1 tablet by mouth every 6 (six) hours as needed for moderate pain. 11/19/17   Argentina Donovan, PA-C  allopurinol (ZYLOPRIM) 300 MG tablet Take 0.5 tablets (150 mg total) by mouth daily. 07/28/17   Shirley Friar, PA-C  atorvastatin (LIPITOR) 20 MG tablet Take 1 tablet (20 mg total) by mouth daily. 07/28/17   Shirley Friar, PA-C  benzonatate  (TESSALON) 100 MG capsule Take 1 capsule (100 mg total) by mouth 3 (three) times daily. 11/26/17   Marene Lenz, MD  calcium acetate (PHOSLO) 667 MG capsule Take 1 capsule (667 mg total) by mouth 3 (three) times daily with meals. Patient taking differently: Take 667-2,001 mg by mouth See admin instructions. Pt takes 2,001mg  by mouth three times daily and 667mg  twice daily with snacks 07/27/17   Shirley Friar, PA-C  colchicine 0.6 MG tablet Take 0.5 tablets (0.3 mg total) by mouth 2 (two) times a week. 09/21/17   Tawny Asal, MD  diclofenac sodium (VOLTAREN) 1 % GEL Apply 4 g topically 4 (four) times daily as needed (pain).     [provider]  docusate sodium (COLACE) 100 MG capsule Take 1 capsule (100 mg total) by mouth 2 (two) times daily. 07/27/17   Shirley Friar, PA-C  folic acid (FOLVITE) 1 MG tablet TAKE 1 TABLET BY MOUTH DAILY. Patient taking differently: TAKE 1 TABLET (1mg ) BY MOUTH DAILY. 11/12/17   [name], MD  hydrOXYzine (ATARAX/VISTARIL) 25 MG tablet 1/2-1 tablets tid prn itching Patient taking differently: Take 12.5-25 mg by  mouth 3 (three) times daily as needed for itching.  11/19/17   Argentina Donovan, PA-C  lactulose (CHRONULAC) 10 GM/15ML solution Take 30 mLs (20 g total) by mouth 2 (two) times daily for 7 days. 11/26/17 12/03/17  Marene Lenz, MD  metoprolol succinate (TOPROL-XL) 25 MG 24 hr tablet Take 1 tablet (25 mg total) by mouth 2 (two) times daily. Patient taking differently: Take 25 mg by mouth 2 (two) times daily.  07/27/17   Shirley Friar, PA-C  midodrine (PROAMATINE) 5 MG tablet Take 2 tablets (10 mg total) by mouth 3 (three) times daily with meals. 09/06/17   Kalman Shan Ratliff, DO  omeprazole (PRILOSEC) 20 MG capsule Take 1 capsule (20 mg total) by mouth daily. 03/03/17   [name], MD  oxyCODONE (OXY IR/ROXICODONE) 5 MG immediate release tablet Take 5 mg by mouth every 8 (eight) hours as needed for pain.     [provider]  OXYGEN Inhale 2 L into the lungs daily as needed.     [provider]  tiZANidine (ZANAFLEX) 4 MG tablet Take 1 tablet (4 mg total) by mouth every 8 (eight) hours as needed for muscle spasms. 08/04/17   [name], MD  warfarin (COUMADIN) 2.5 MG tablet Take 1 tablet (2.5 mg total) by mouth daily at 6 PM. Or as directed by coumadin clinic Patient taking differently: Take 2.5-3.75 mg by mouth See admin instructions. 3.75 mg every Wed and Sat and 2.5 mg all other days OR as directed by coumadin clinic 10/15/17   Fay Records, MD    Physical Exam: Vitals:   11/30/17 2200 11/30/17 2300 11/30/17 2310 12/01/17 0014  BP:    (!) 84/58  Pulse: 96 (!) 118    Resp: 18 (!) 24    Temp:   99.7 F (37.6 C)   TempSrc:   Oral   SpO2: 97% 98%        Constitutional: NAD, calm, appears restless and uncomfortable Eyes: PERTLA, lids and conjunctivae normal ENMT: Mucous membranes are moist. Posterior pharynx clear of any exudate or lesions.   Neck: normal, supple, no masses, no thyromegaly Respiratory: Diffuse rales. No accessory muscle use.  Cardiovascular: Rate ~110 and irregular. No significant JVD. Abdomen: No distension, no tenderness, soft. Bowel sounds normal.  Musculoskeletal: no clubbing / cyanosis. No joint deformity upper and lower extremities.   Skin: no significant rashes, lesions, ulcers. Warm, dry, well-perfused. Neurologic: CN 2-12 grossly intact. Sensation intact. Strength 5/5 in all 4 limbs.  Psychiatric: Alert and oriented x 3. Restless.     Labs on Admission: I have personally reviewed following labs and imaging studies  CBC: Recent Labs  Lab 11/25/17 0500 11/26/17 0831 11/30/17 1627  WBC 7.6 6.2 7.1  HGB 9.1* 10.3* 11.1*  HCT 27.9* 30.9* 33.0*  MCV 76.9* 78.0 78.0  PLT 117* 107* 56*   Basic Metabolic Panel: Recent Labs  Lab 11/25/17 0815 11/26/17 0831 11/30/17 1627  NA 133* 136 137  K 3.2* 3.3* 4.4  CL 97* 100* 101  CO2 28  27 27   GLUCOSE 151* 84 91  BUN 12 9 15   CREATININE 3.87* 3.25* 2.58*  CALCIUM 8.7* 8.7* 8.1*  PHOS 3.2  --   --    GFR: Estimated Creatinine Clearance: 26.9 mL/min (A) (by C-G formula based on SCr of 2.58 mg/dL (H)). Liver Function Tests: Recent Labs  Lab 11/25/17 0815 11/30/17 1731  AST  --  48*  ALT  --  16*  ALKPHOS  --  184*  BILITOT  --  2.5*  PROT  --  8.0  ALBUMIN 2.7* 3.1*   Recent Labs  Lab 11/30/17 1731  LIPASE 69*   Recent Labs  Lab 11/24/17 0521 11/26/17 0831 11/30/17 1731  AMMONIA 64* 39* 50*   Coagulation Profile: Recent Labs  Lab 11/25/17 0500 11/26/17 0831 11/30/17 1731  INR 1.91 1.73 1.92   Cardiac Enzymes: Recent Labs  Lab 11/24/17 0521 11/24/17 1157 11/24/17 2119  TROPONINI 0.08* 0.07* 0.05*   BNP (last 3 results) Recent Labs    03/16/17 1401  PROBNP 6,530*   HbA1C: No results for input(s): HGBA1C in the last 72 hours. CBG: Recent Labs  Lab 11/24/17 0653 11/24/17 1010 11/24/17 1055 11/24/17 1318 11/26/17 0612  GLUCAP 68 57* 119* 83 128*   Lipid Profile: No results for input(s): CHOL, HDL, LDLCALC, TRIG, CHOLHDL, LDLDIRECT in the last 72 hours. Thyroid Function Tests: No results for input(s): TSH, T4TOTAL, FREET4, T3FREE, THYROIDAB in the last 72 hours. Anemia Panel: No results for input(s): VITAMINB12, FOLATE, FERRITIN, TIBC, IRON, RETICCTPCT in the last 72 hours. Urine analysis:    Component Value Date/Time   COLORURINE AMBER (A) 07/25/2017 1806   APPEARANCEUR HAZY (A) 07/25/2017 1806   LABSPEC 1.018 07/25/2017 1806   PHURINE 5.0 07/25/2017 1806   GLUCOSEU NEGATIVE 07/25/2017 1806   HGBUR MODERATE (A) 07/25/2017 1806   BILIRUBINUR NEGATIVE 07/25/2017 1806   KETONESUR NEGATIVE 07/25/2017 1806   PROTEINUR 100 (A) 07/25/2017 1806   NITRITE NEGATIVE 07/25/2017 1806   LEUKOCYTESUR NEGATIVE 07/25/2017 1806   Sepsis Labs: @LABRCNTIP (procalcitonin:4,lacticidven:4) ) Recent Results (from the past 240 hour(s))  MRSA  PCR Screening     Status: None   Collection Time: 11/25/17  3:40 AM  Result Value Ref Range Status   MRSA by PCR NEGATIVE NEGATIVE Final    Comment:        The GeneXpert MRSA Assay (FDA approved for NASAL specimens only), is one component of a comprehensive MRSA colonization surveillance program. It is not intended to diagnose MRSA infection nor to guide or monitor treatment for MRSA infections.      Radiological Exams on Admission: Dg Chest Portable 1 View  Result Date: 11/30/2017 CLINICAL DATA:  Chest pain EXAM: PORTABLE CHEST 1 VIEW COMPARISON:  11/23/2017 FINDINGS: Large-bore RIGHT central venous line. Sternotomy wires overlie enlarged cardiac silhouette. There is improvement in bilateral pleural effusions. Mild interstitial edema remains. IMPRESSION: Mild interstitial edema.  Improved vascular pattern from 11/23/2017. Electronically Signed   By: Suzy Bouchard M.D.   On: 11/30/2017 17:16    EKG: Independently reviewed. Atrial fibrillation, PVC, incomplete RBBB, LAFB.   Assessment/Plan  1. SIRS - Presents with chest pain that resolved spontaneously, noted to be febrile without leukocytosis  - CXR with edema, but no conspicuous PNA  - No GI complaints or meningismus  - Rhinorrhea suggests acute viral URI, will check a viral panel  - Catheter in left chest is unbandaged at pt actively picking at it; there is no surrounding edema, erythema, or purulence  - Blood cultures collected in ED and empiric abx initiated  - Suspect a viral illness; line infection also considered  - Continue abx while following viral panel, cultures, clinical course   2. ESRD; hypervolemia  - Presents from dialysis center prior to completion for eval of CP that resolved spontaneously  - There is pulmonary edema and mild respiratory distress  - No electrolyte abnormality or acidosis  - Nephrology is consulting and much appreciated  - SLIV,  renal diet, continue binders   3. Chest pain  - Presents  with chest pain that resolved without NTG or analgesia   - Troponin is wnl x2 and no acute ischemic features noted on EKG   - Continue cardiac monitoring, continue AC with warfarin, hold metoprolol until BP allows   4. Chronic pain  - Continue home-regimen with prn APAP #3, prn Oxy IR, prn Voltaren gel, prn Zanaflex   5. Atrial fibrillation  - In atrial fibrillation on arrival with rate 80-130 - CHADS-VASc at least 3 (CHF, CAD, HTN)  - Continue warfarin, continue cardiac monitoring given intermittent RVR    DVT prophylaxis: SCD's Code Status: Full  Family Communication: Discussed with patient Disposition Plan: Observe in SDU  Consults called: Nephrology Admission status: Observation    Vianne Bulls, MD [facility] Pager [phone]  If 7PM-7AM, please contact night-coverage [email or web address] Password [email or web address]  12/01/2017, 2:02 AM

## 2017-12-01 NOTE — Progress Notes (Signed)
Pharmacy Anticoagulation Addendum: Medication history is incomplete - however we were able to receive list from HD clinic.  Per this list, warfarin dose is lower at 2.5mg  po daily. Per Anti-coag visit note from 11/11/17 - Dose was changed to 3.75mg  on Wed and Sat and 2.5mg  all other days.  INR on admission 1.72. Last dose of warfarin was 11/30/17.  Platelets noted to be low. MD aware.   Plan: Will lower dose of warfarin to 3.75mg  po x1 tonight for now.  Will follow-up for accurate medication history. Will follow-up CBC and INR in AM.   Sloan Leiter, PharmD, BCPS, BCCCP Clinical Pharmacist Clinical phone 12/01/2017 until 3:30PM - #[phone] After hours, please call [phone] 12/01/2017, 9:06 AM

## 2017-12-01 NOTE — ED Notes (Signed)
Meal Tray ordered.  

## 2017-12-02 ENCOUNTER — Other Ambulatory Visit: Payer: Self-pay

## 2017-12-02 LAB — RENAL FUNCTION PANEL
Albumin: 2.6 g/dL — ABNORMAL LOW (ref 3.5–5.0)
Anion gap: 12 (ref 5–15)
BUN: 37 mg/dL — ABNORMAL HIGH (ref 6–20)
CHLORIDE: 95 mmol/L — AB (ref 101–111)
CO2: 22 mmol/L (ref 22–32)
CREATININE: 3.67 mg/dL — AB (ref 0.61–1.24)
Calcium: 8.7 mg/dL — ABNORMAL LOW (ref 8.9–10.3)
GFR calc Af Amer: 19 mL/min — ABNORMAL LOW (ref >60)
GFR calc non Af Amer: 17 mL/min — ABNORMAL LOW (ref >60)
Glucose, Bld: 111 mg/dL — ABNORMAL HIGH (ref 65–99)
POTASSIUM: 5 mmol/L (ref 3.5–5.1)
Phosphorus: 2.9 mg/dL (ref 2.5–4.6)
Sodium: 129 mmol/L — ABNORMAL LOW (ref 135–145)

## 2017-12-02 LAB — GLUCOSE, CAPILLARY
GLUCOSE-CAPILLARY: 141 mg/dL — AB (ref 65–99)
Glucose-Capillary: 148 mg/dL — ABNORMAL HIGH (ref 65–99)
Glucose-Capillary: 104 mg/dL — ABNORMAL HIGH (ref 65–99)
Glucose-Capillary: 105 mg/dL — ABNORMAL HIGH (ref 65–99)
Glucose-Capillary: 130 mg/dL — ABNORMAL HIGH (ref 65–99)

## 2017-12-02 LAB — PROTIME-INR
INR: 1.61
Prothrombin Time: 19 seconds — ABNORMAL HIGH (ref 11.4–15.2)

## 2017-12-02 LAB — CBC
HEMATOCRIT: 28.5 % — AB (ref 39.0–52.0)
Hemoglobin: 9.7 g/dL — ABNORMAL LOW (ref 13.0–17.0)
MCH: 25.9 pg — AB (ref 26.0–34.0)
MCHC: 34 g/dL (ref 30.0–36.0)
MCV: 76 fL — AB (ref 78.0–100.0)
PLATELETS: 67 10*3/uL — AB (ref 150–400)
RBC: 3.75 MIL/uL — ABNORMAL LOW (ref 4.22–5.81)
RDW: 17.9 % — AB (ref 11.5–15.5)
WBC: 7.5 10*3/uL (ref 4.0–10.5)

## 2017-12-02 LAB — MRSA PCR SCREENING: MRSA by PCR: NEGATIVE

## 2017-12-02 MED ORDER — SODIUM CHLORIDE 0.9 % IV SOLN: 100.0000 mL | INTRAVENOUS | Status: DC | PRN

## 2017-12-02 MED ORDER — METOPROLOL SUCCINATE ER 25 MG PO TB24
25.0000 mg | ORAL_TABLET | Freq: Every day | ORAL | Status: DC
  Administered 2017-12-02: 25 mg via ORAL
  Filled 2017-12-02: qty 1

## 2017-12-02 MED ORDER — PENTAFLUOROPROP-TETRAFLUOROETH EX AERO: 1.0000 "application " | INHALATION_SPRAY | CUTANEOUS | Status: DC | PRN

## 2017-12-02 MED ORDER — HEPARIN SODIUM (PORCINE) 1000 UNIT/ML DIALYSIS: 1000.0000 [IU] | INTRAMUSCULAR | Status: DC | PRN

## 2017-12-02 MED ORDER — LIDOCAINE HCL (PF) 1 % IJ SOLN: 5.0000 mL | INTRAMUSCULAR | Status: DC | PRN

## 2017-12-02 MED ORDER — MIDODRINE HCL 5 MG PO TABS
ORAL_TABLET | ORAL | Status: AC
  Filled 2017-12-02: qty 2

## 2017-12-02 MED ORDER — DARBEPOETIN ALFA 100 MCG/0.5ML IJ SOSY
PREFILLED_SYRINGE | INTRAMUSCULAR | Status: AC
  Filled 2017-12-02: qty 0.5

## 2017-12-02 MED ORDER — VANCOMYCIN HCL IN DEXTROSE 750-5 MG/150ML-% IV SOLN
INTRAVENOUS | Status: AC
  Administered 2017-12-02: 750 mg
  Filled 2017-12-02: qty 150

## 2017-12-02 MED ORDER — INSULIN ASPART 100 UNIT/ML ~~LOC~~ SOLN
0.0000 [IU] | Freq: Three times a day (TID) | SUBCUTANEOUS | Status: DC
  Administered 2017-12-06 – 2017-12-13 (×4): 1 [IU] via SUBCUTANEOUS

## 2017-12-02 MED ORDER — LIDOCAINE-PRILOCAINE 2.5-2.5 % EX CREA: 1.0000 "application " | TOPICAL_CREAM | CUTANEOUS | Status: DC | PRN

## 2017-12-02 MED ORDER — SODIUM CHLORIDE 0.9 % IV BOLUS (SEPSIS)
250.0000 mL | Freq: Once | INTRAVENOUS | Status: AC
  Administered 2017-12-02: 250 mL via INTRAVENOUS

## 2017-12-02 MED ORDER — ALTEPLASE 2 MG IJ SOLR: 2.0000 mg | Freq: Once | INTRAMUSCULAR | Status: DC | PRN

## 2017-12-02 MED ORDER — MIDODRINE HCL 5 MG PO TABS
10.0000 mg | ORAL_TABLET | Freq: Once | ORAL | Status: AC
  Administered 2017-12-02: 10 mg via ORAL
  Filled 2017-12-02: qty 2

## 2017-12-02 MED ORDER — WARFARIN SODIUM 2.5 MG PO TABS
2.5000 mg | ORAL_TABLET | Freq: Once | ORAL | Status: AC
  Administered 2017-12-02: 2.5 mg via ORAL
  Filled 2017-12-02: qty 1

## 2017-12-02 MED ORDER — VANCOMYCIN HCL IN DEXTROSE 750-5 MG/150ML-% IV SOLN
750.0000 mg | Freq: Once | INTRAVENOUS | Status: DC
  Filled 2017-12-02: qty 150

## 2017-12-02 MED ORDER — HEPARIN SODIUM (PORCINE) 1000 UNIT/ML DIALYSIS: 100.0000 [IU]/kg | INTRAMUSCULAR | Status: DC | PRN

## 2017-12-02 NOTE — Progress Notes (Signed)
Hospitalist progress note   Michael [name]  VOZ:[id number] DOB: [birth year] DOA: 11/30/2017 PCP: [name], MD   Specialists:  Nephro  Brief Narrative:  57 year old male, ESRD MWF S-complicated by hypotension on midodrine, chronic pain, atrial fibrillation chads score ~4 on warfarin, hepatic cirrhosis + encephalopathy chronic thrombocytopenia, CAD status post CABG with predominant right-sided heart failure-status post bioprosthetic MVR + tricuspid repair 2012 [facility] CLT, HLD, chronic respiratory failure, type 2 diabetes mellitus previous-colonoscopy 07/16/2017 sessile polyps status post removal/Int hemorrhoids, prior gout PEA arrest 07/2017 status post CPR-at that admission left heart cath showing chronically occluded distal RDA collateralized with no intervention needed  Recent admission 1/7-1/10 with hepatic encephalopathy and placed on lactulose at the time Returns to hospital 1/15 shortness of breath-febrile 38.9 tachycardic 110 CXR?  Edema flu negative, lactic acid 2.6 troponins normal admitted with Sirs suspected sepsis    Assessment & Plan:   Assessment:  The primary encounter diagnosis was Tachycardia. Diagnoses of Precordial pain, Fever, unspecified fever cause, Cough, and Shortness of breath were also pertinent to this visit.  Sepsis-coronovirus +, unlikely source-catheter left chest which may be source-de-escalate vanc/ceftri -foll culture data obtained 1/15-lactic acid 2.6-2.51-admitted to stepdown--copntact precautions to conitnue  ESRD hypervolemia Monday Wednesday Friday Saturday-nephrology to see patient and evaluate-continue PhosLo 3 times daily-appreciate input  Atrial fibrillation, mitral valve repair in 2012, CABG status post cath 07/2017 with PEA arrest, chads score >4-continue Coumadin-metoprolol held on admission as hypotensive on admission--on cardizem gtt which we will d/c after staring lower dose Toprol XL  Chronic pain continue OxyIR as needed Voltaren  Chronic  respiratory failure on oxygen-wean as tolerated  Hyperlipidemia-continue Lipitor 20 daily  Liver cirrhosis idiopathic-continue lactulose 30 mils twice daily, hydroxyzine 20 5/2-1 as needed itching 3 times daily--needs outpatient GI follow-up--Lipase was slightly high-will monitor  Reflux, prior colonic polyps-continue pantoprazole 40 daily  Gout continue 150 mg allopurinol daily, colchicine 0.5 twice  Weekly  DM ty 11 not on meds-renal card diet-SSI   DVT prophylaxis: lovenox  Code Status:   full   Family Communication:   None present at bedsdie  Disposition Plan:  inpatient   Consultants:   Nephrology  Procedures:   ?  Antimicrobials:   Vancomycin 1/15  ceftriaxone 1/15  Subjective:  Improved overall-has paroxysm of cough No cp no fever no chills No n/v Seen on HD unit  Objective: Vitals:   12/02/17 1030 12/02/17 1045 12/02/17 1100 12/02/17 1115  BP: (!) 99/38 (!) 80/40 (!) 92/54 (!) 84/49  Pulse: 63 69 63 67  Resp:      Temp:      TempSrc:      SpO2:      Weight:      Height:        Intake/Output Summary (Last 24 hours) at 12/02/2017 1144 Last data filed at 12/02/2017 0600 Gross per 24 hour  Intake 1295.75 ml  Output -  Net 1295.75 ml   Filed Weights   12/02/17 0418 12/02/17 0641 12/02/17 0921  Weight: 66.9 kg (147 lb 7.8 oz) 66.9 kg (147 lb 7.8 oz) 66.9 kg (147 lb 7.8 oz)    Examination:  eomi ncat in nad Chest clear no added sound No le edema No asterixis Neuro intact moves 4 limbs equally    Data Reviewed: I have personally reviewed following labs and imaging studies  CBC: Recent Labs  Lab 11/26/17 0831 11/30/17 1627 12/01/17 0509 12/02/17 0429  WBC 6.2 7.1 7.4 7.5  NEUTROABS  --   --  4.2  --  HGB 10.3* 11.1* 10.3* 9.7*  HCT 30.9* 33.0* 30.8* 28.5*  MCV 78.0 78.0 77.0* 76.0*  PLT 107* 56* 56* 67*   Basic Metabolic Panel: Recent Labs  Lab 11/26/17 0831 11/30/17 1627 12/01/17 0509 12/02/17 0429  NA 136 137 133* 129*   K 3.3* 4.4 4.1 5.0  CL 100* 101 97* 95*  CO2 27 27 25 22   GLUCOSE 84 91 124* 111*  BUN 9 15 23* 37*  CREATININE 3.25* 2.58* 2.97* 3.67*  CALCIUM 8.7* 8.1* 8.4* 8.7*  PHOS  --   --   --  2.9   GFR: Estimated Creatinine Clearance: 19.1 mL/min (A) (by C-G formula based on SCr of 3.67 mg/dL (H)). Liver Function Tests: Recent Labs  Lab 11/30/17 1731 12/02/17 0429  AST 48*  --   ALT 16*  --   ALKPHOS 184*  --   BILITOT 2.5*  --   PROT 8.0  --   ALBUMIN 3.1* 2.6*   Recent Labs  Lab 11/30/17 1731  LIPASE 69*   Recent Labs  Lab 11/26/17 0831 11/30/17 1731  AMMONIA 39* 50*   Coagulation Profile: Recent Labs  Lab 11/26/17 0831 11/30/17 1731 12/01/17 0509 12/02/17 0429  INR 1.73 1.92 1.72 1.61   Cardiac Enzymes: No results for input(s): CKTOTAL, CKMB, CKMBINDEX, TROPONINI in the last 168 hours. CBG: Recent Labs  Lab 11/26/17 0612 12/02/17 0837  GLUCAP 128* 148*   Urine analysis:    Component Value Date/Time   COLORURINE AMBER (A) 07/25/2017 1806   APPEARANCEUR HAZY (A) 07/25/2017 1806   LABSPEC 1.018 07/25/2017 1806   PHURINE 5.0 07/25/2017 1806   GLUCOSEU NEGATIVE 07/25/2017 1806   HGBUR MODERATE (A) 07/25/2017 1806   BILIRUBINUR NEGATIVE 07/25/2017 1806   KETONESUR NEGATIVE 07/25/2017 1806   PROTEINUR 100 (A) 07/25/2017 1806   NITRITE NEGATIVE 07/25/2017 1806   LEUKOCYTESUR NEGATIVE 07/25/2017 1806     Radiology Studies: Reviewed images personally in health database    Scheduled Meds: . allopurinol  150 mg Oral Daily  . atorvastatin  20 mg Oral Daily  . benzonatate  200 mg Oral TID  . calcium acetate  2,001 mg Oral TID WC  . Darbepoetin Alfa      . darbepoetin (ARANESP) injection - DIALYSIS  100 mcg Intravenous Q Wed-HD  . docusate sodium  100 mg Oral BID  . insulin aspart  0-9 Units Subcutaneous TID WC  . midodrine  10 mg Oral TID WC  . multivitamin  1 tablet Oral QHS  . pantoprazole  40 mg Oral Daily  . sodium chloride flush  3 mL  Intravenous Q12H  . sodium chloride flush  3 mL Intravenous Q12H  . warfarin  2.5 mg Oral ONCE-1800  . Warfarin - Pharmacist Dosing Inpatient   Does not apply q1800   Continuous Infusions: . sodium chloride    . sodium chloride    . sodium chloride    . diltiazem (CARDIZEM) infusion 10 mg/hr (12/02/17 0904)  . piperacillin-tazobactam (ZOSYN)  IV Stopped (12/02/17 0343)  . Vancomycin    . [START ON 12/03/2017] vancomycin    . vancomycin       LOS: 1 day    Time spent: [place], MD [facility]   If 7PM-7AM, please contact night-coverage [email or web address] Password [facility] 12/02/2017, 11:44 AM

## 2017-12-02 NOTE — Progress Notes (Signed)
Subjective: Interval History: has complaints not sleeping well.  Objective: Vital signs in last 24 hours: Temp:  [97.7 F (36.5 C)-100.7 F (38.2 C)] 97.7 F (36.5 C) (01/16 0803) Pulse Rate:  [59-97] 68 (01/16 0803) Resp:  [12-28] 20 (01/16 0803) BP: (94-143)/(51-92) 106/70 (01/16 0803) SpO2:  [94 %-100 %] 98 % (01/16 0803) Weight:  [66.9 kg (147 lb 7.8 oz)] 66.9 kg (147 lb 7.8 oz) (01/16 0641) Weight change:   Intake/Output from previous day: 01/15 0701 - 01/16 0700 In: 1295.8 [P.O.:1044; I.V.:201.8; IV Piggyback:50] Out: -  Intake/Output this shift: No intake/output data recorded.  General appearance: alert, cooperative and pale Resp: rales bibasilar Cardio: S1, S2 normal and systolic murmur: holosystolic 2/6, blowing at apex GI: soft, pos bs, liver down 5 cm Extremities: edema 1+ and avf LUA, HD cath RIJ  Lab Results: Recent Labs    12/01/17 0509 12/02/17 0429  WBC 7.4 7.5  HGB 10.3* 9.7*  HCT 30.8* 28.5*  PLT 56* 67*   BMET:  Recent Labs    12/01/17 0509 12/02/17 0429  NA 133* 129*  K 4.1 5.0  CL 97* 95*  CO2 25 22  GLUCOSE 124* 111*  BUN 23* 37*  CREATININE 2.97* 3.67*  CALCIUM 8.4* 8.7*   No results for input(s): PTH in the last 72 hours. Iron Studies: No results for input(s): IRON, TIBC, TRANSFERRIN, FERRITIN in the last 72 hours.  Studies/Results: Dg Chest Portable 1 View  Result Date: 11/30/2017 CLINICAL DATA:  Chest pain EXAM: PORTABLE CHEST 1 VIEW COMPARISON:  11/23/2017 FINDINGS: Large-bore RIGHT central venous line. Sternotomy wires overlie enlarged cardiac silhouette. There is improvement in bilateral pleural effusions. Mild interstitial edema remains. IMPRESSION: Mild interstitial edema.  Improved vascular pattern from 11/23/2017. Electronically Signed   By: Suzy Bouchard M.D.   On: 11/30/2017 17:16    I have reviewed the patient's current medications.  Assessment/Plan: 1 ESRD for HD, lower vol with serial HD.  AVF about 1 mon from  use 2 Fevers resolving, cultures neg, suspect viral 3 DM controlled 4 Afib rate control,anticoag 5 Anemia esa, 6 HPTH vit D 7 MVR/TV repair/MAZE 8 CAD 9 CM P HD 2 d in [place], amio, anticoag, follow temp    LOS: 1 day   Camy Leder 12/02/2017,8:51 AM

## 2017-12-02 NOTE — Procedures (Signed)
I was present at this session.  I have reviewed the session itself and made appropriate changes.  HD via RIJ PC. bp low, on mido, will limit fluid removal. tol well [place] 1/16/201911:14 AM

## 2017-12-02 NOTE — Consult Note (Signed)
   [facility] CM Inpatient Consult   12/02/2017  Michael [name] Sep 21, 1961 [id number]  Patient is active with [facility] pharmacist. Recently declines home visit from [place].   Assessed for re-admissions. Admitted with precordial pain, fever with shortness of breath SIRS and suspect Sepsis per MD notes. His PMH includes but not limited to Diabetes, Atrial Fib, and PEA arrest.  Hemodialysis on Monday, Wednesday, Friday and Saturday schedule.    [facility] Care management chart review reveals that the patient and caregiver declined home visits. He was recently active with [facility] Pharmacist.   Patient refuses interpreters in the past and speak [name].   Will update [facility] of re-admission and spoke with  inpatient RNCM, Neoma Laming regarding difficulty maintaining contact and declining home visits per [facility] notes. Will follow for progress and assess for ongoing [facility] care management and patient's willingness for services.  For questions, please contact:  Natividad Brood, RN BSN [facility] Liaison  [phone] business mobile phone Toll free office [phone]

## 2017-12-02 NOTE — Progress Notes (Signed)
Pharmacy Anticoagulation Note  Michael [name] is a 57 y.o. male admitted on 11/30/2017 with chest pain. On warfarin 2.5mg  daily exc fpr 3.75mg  on Wed/Sat PTA for AFib, bioprosthetic MV. CHADsVASC = 3. Admit INR 1.92; INR today continues to decrease to 1.61. Hgb 9.7, and plts low but up to 67. If drops below 50 should consider holding anticoag. Warfarin PTA for AFib. Also has had a MVR - bioprosthetic valve.  Plan: Give Coumadin 2.5mg  PO x 1 tonight Monitor daily INR, CBC, s/s of bleed  Elenor Quinones, PharmD, [facility] Clinical Pharmacist Pager [phone] 12/02/2017 1:04 PM

## 2017-12-02 NOTE — Progress Notes (Signed)
@   1645: Pt. HR sustaining in the 50s and occasionally will dip down into the 40s for a couple of seconds only. BP 80/52 (62) and 77/50 (60) respectively. Dr. Verlon Au notified of the values and made aware that the cardizem gtt was d/c'd at 1555. Orders to monitor closely and recheck BP about 45 minutes after 1700 dose of midodrine given and if pt. does not positively respond then a 26ml bolus would be considered.    Follow up: BP 80/59 (68) at 1800. Then BP started trending down again with BP 70/46 (56) @ 1835, and 75/53 (62) @ 1837. Dr. Verlon Au notified again and gave verbal orders for a 238ml bolus and a one-time dose of midodrine 10mg  after bolus complete.

## 2017-12-03 ENCOUNTER — Inpatient Hospital Stay (HOSPITAL_COMMUNITY): Payer: Medicare Other

## 2017-12-03 LAB — GLUCOSE, CAPILLARY
GLUCOSE-CAPILLARY: 89 mg/dL (ref 65–99)
GLUCOSE-CAPILLARY: 129 mg/dL — AB (ref 65–99)
Glucose-Capillary: 101 mg/dL — ABNORMAL HIGH (ref 65–99)

## 2017-12-03 LAB — PROTIME-INR
INR: 2.1
PROTHROMBIN TIME: 23.4 s — AB (ref 11.4–15.2)

## 2017-12-03 LAB — CBC
HCT: 29.4 % — ABNORMAL LOW (ref 39.0–52.0)
HEMOGLOBIN: 9.9 g/dL — AB (ref 13.0–17.0)
MCH: 25.6 pg — ABNORMAL LOW (ref 26.0–34.0)
MCHC: 33.7 g/dL (ref 30.0–36.0)
MCV: 76 fL — ABNORMAL LOW (ref 78.0–100.0)
PLATELETS: 101 10*3/uL — AB (ref 150–400)
RBC: 3.87 MIL/uL — AB (ref 4.22–5.81)
RDW: 17.5 % — ABNORMAL HIGH (ref 11.5–15.5)
WBC: 6 10*3/uL (ref 4.0–10.5)

## 2017-12-03 MED ORDER — WARFARIN SODIUM 2.5 MG PO TABS
2.5000 mg | ORAL_TABLET | Freq: Once | ORAL | Status: AC
  Administered 2017-12-03: 2.5 mg via ORAL
  Filled 2017-12-03: qty 1

## 2017-12-03 MED ORDER — HYDROCODONE-HOMATROPINE 5-1.5 MG/5ML PO SYRP
5.0000 mL | ORAL_SOLUTION | ORAL | Status: DC | PRN
Start: 2017-12-03 — End: 2017-12-14
  Administered 2017-12-03 – 2017-12-07 (×4): 5 mL via ORAL
  Filled 2017-12-03 (×4): qty 5

## 2017-12-03 NOTE — Progress Notes (Signed)
ANTICOAGULATION CONSULT NOTE - Initial Consult  Pharmacy Consult for Coumadin Indication: atrial fibrillation / bioprosthetic MV  No Known Allergies  Patient Measurements: Height: 5\' 5"  (165.1 cm) Weight: 146 lb 6.2 oz (66.4 kg) IBW/kg (Calculated) : 61.5  Assessment: 57 yo M presents on 1/15 with chest pain and SOB. On warfarin 2.5mg  daily exc for 3.75mg  on Wed/Sat PTA for AFib, bioprosthetic MV. CHADsVASC = 3. Admit INR 1.92; INR today back up to 2.1. Hgb 9.7, and plts low but up to 67. If drops below 50 should consider holding anticoag.  Goal of Therapy:  INR 2-3 Monitor platelets by anticoagulation protocol: Yes   Plan:  Give Coumadin 2.5mg  PO x 1 tonight Monitor daily INR, CBC, s/s of bleed  Cara Thaxton J 12/03/2017,8:27 AM

## 2017-12-03 NOTE — Progress Notes (Signed)
Subjective: Interval History: has complaints still some cough but breathing better.  Objective: Vital signs in last 24 hours: Temp:  [97.8 F (36.6 C)-98.6 F (37 C)] 97.8 F (36.6 C) (01/17 0300) Pulse Rate:  [52-84] 71 (01/17 0300) Resp:  [11-22] 17 (01/17 0300) BP: (70-134)/(30-78) 78/51 (01/17 0300) SpO2:  [92 %-100 %] 96 % (01/17 0300) Weight:  [66.4 kg (146 lb 6.2 oz)-66.9 kg (147 lb 7.8 oz)] 66.4 kg (146 lb 6.2 oz) (01/16 1336) Weight change: 0 kg (0 lb)  Intake/Output from previous day: 01/16 0701 - 01/17 0700 In: 980 [P.O.:480; I.V.:500] Out: 1000  Intake/Output this shift: No intake/output data recorded.  General appearance: alert, cooperative, no distress and mild ^ RR, ^JVD Resp: rales bibasilar Chest wall: IJ PC Cardio: regular rate and rhythm and systolic murmur: holosystolic 2/6, blowing at apex, crisp valve sounds GI: liver down 5 cm, pos bs , soft,  Extremities: AVF RUA  Lab Results: Recent Labs    12/01/17 0509 12/02/17 0429  WBC 7.4 7.5  HGB 10.3* 9.7*  HCT 30.8* 28.5*  PLT 56* 67*   BMET:  Recent Labs    12/01/17 0509 12/02/17 0429  NA 133* 129*  K 4.1 5.0  CL 97* 95*  CO2 25 22  GLUCOSE 124* 111*  BUN 23* 37*  CREATININE 2.97* 3.67*  CALCIUM 8.4* 8.7*   No results for input(s): PTH in the last 72 hours. Iron Studies: No results for input(s): IRON, TIBC, TRANSFERRIN, FERRITIN in the last 72 hours.  Studies/Results: No results found.  I have reviewed the patient's current medications.  Assessment/Plan: 1 ESRD for extra HD. Vol better. 2 Afib controlled 3 DM controlled 4 Anemia esa 5 HPTH vit D 6 MVR/TV repair/MAZE    LOS: 2 days   Jeneen Rinks Jodeci Roarty 12/03/2017,8:09 AM

## 2017-12-03 NOTE — Progress Notes (Signed)
Hospitalist progress note   Michael [name]  EQA:[id number] DOB: 02/02/1961 DOA: 11/30/2017 PCP: [name], MD   Specialists:  Nephro  Brief Narrative:  33 M, ESRD MWF S-c/b hypotension on midodrine, chronic pain, afib chads score ~4 on warfarin, hepatic cirrhosis + encephalopathy chronic thrombocytopenia, CAD s/p CABG+right-sided heart failure-status post bioprosthetic MVR + tricuspid repair 2012 [place] CLT, HLD, chronic respiratory failure, type 2 diabetes mellitus previous-colonoscopy 07/16/2017 sessile polyps status post removal/Int hemorrhoids, prior gout PEA arrest 07/2017 status post CPR-at that admission left heart cath showing chronically occluded distal RDA collateralized with no intervention needed  Recent admission 1/7-1/10 with hepatic encephalopathy and placed on lactulose at the time Returns to hospital 1/15 shortness of breath-febrile 38.9 tachycardic 110 CXR?  Edema flu negative, lactic acid 2.6 troponins normal admitted with Sirs suspected sepsis    Assessment & Plan:   Assessment:  The primary encounter diagnosis was Tachycardia. Diagnoses of Precordial pain, Fever, unspecified fever cause, Cough, and Shortness of breath were also pertinent to this visit.  Sepsis-coronovirus +, unlikely source-catheter left chest -de-escalated vanc/ceftri ->off.  BC X2 1/15 NG TD-lactic acid 2.6-2.5.  transfer to tele today.  Adding hycodan for cough suppression 1/17  ESRD hypovolemia MWFS-continue PhosLo 3 times daily-appreciate input Consider lowering of EDW  hypotensive 1/16 probably related to sepsis secondary to coronavirus + constitutional state-250 cc IV fluid bolus given 1/16, extra dose Midodrine-blood pressures have improved marginally on 1/17-we will follow  Atrial fibrillation, mitral valve repair in 2012, CABG status post cath 07/2017 with PEA arrest, chads score >4-continue Coumadin-metoprolol restarted but hypotensive 1/18--> just monitor on telemetry without rate  controlling agent for now-if recurs may need to use Cardizem 30 mg 3 times daily  Anemia renal disease-repeat CBC today monitor labs  Chronic pain continue OxyIR as needed Voltaren  Chronic respiratory failure on oxygen-wean as tolerated  Hyperlipidemia-continue Lipitor 20 daily  Liver cirrhosis idiopathic + thrombocytopenia-continue lactulose 30 ml twice daily, hydroxyzine 20 5/2-1 as needed itching 3 times daily-rpt LFt in am--TCP has improved likely coinciding with recovery from viral illness  Reflux, prior colonic polyps-continue pantoprazole 40 daily  Gout continue 150 mg allopurinol daily, colchicine 0.5 twice  Weekly  DM ty 11 not on meds-renal card diet-SSI   DVT prophylaxis: lovenox  Code Status:   full   Family Communication: Left message on voicemail for friend Raquel Sarna [phone] Disposition Plan:  inpatient   Consultants:   Nephrology  Procedures:   ?  Antimicrobials:   Vancomycin 1/15-1/16  ceftriaxone 1/15-1/16  Subjective:  Overall looks better more alert oriented Still coughing a lot   Objective: Vitals:   12/02/17 1837 12/02/17 2028 12/02/17 2300 12/03/17 0300  BP: (!) 75/53 122/78 (!) 104/59 (!) 78/51  Pulse: (!) 53 71 74 71  Resp: 17 20 12 17   Temp:  98.3 F (36.8 C) 98 F (36.7 C) 97.8 F (36.6 C)  TempSrc:  Oral Oral Oral  SpO2: 98% 96% 92% 96%  Weight:      Height:        Intake/Output Summary (Last 24 hours) at 12/03/2017 0748 Last data filed at 12/02/2017 2304 Gross per 24 hour  Intake 980 ml  Output 1000 ml  Net -20 ml   Filed Weights   12/02/17 0641 12/02/17 0921 12/02/17 1336  Weight: 66.9 kg (147 lb 7.8 oz) 66.9 kg (147 lb 7.8 oz) 66.4 kg (146 lb 6.2 oz)    Examination:  eomi ncat in nad Chest R post lung fields more dull  to sound and persuccion-no rales no ronchi No edema Neuro intact moves 4 limbs equally--awake and alert in nad No jvd    Data Reviewed: I have personally reviewed following labs and imaging  studies  CBC: Recent Labs  Lab 11/26/17 0831 11/30/17 1627 12/01/17 0509 12/02/17 0429  WBC 6.2 7.1 7.4 7.5  NEUTROABS  --   --  4.2  --   HGB 10.3* 11.1* 10.3* 9.7*  HCT 30.9* 33.0* 30.8* 28.5*  MCV 78.0 78.0 77.0* 76.0*  PLT 107* 56* 56* 67*   Basic Metabolic Panel: Recent Labs  Lab 11/26/17 0831 11/30/17 1627 12/01/17 0509 12/02/17 0429  NA 136 137 133* 129*  K 3.3* 4.4 4.1 5.0  CL 100* 101 97* 95*  CO2 27 27 25 22   GLUCOSE 84 91 124* 111*  BUN 9 15 23* 37*  CREATININE 3.25* 2.58* 2.97* 3.67*  CALCIUM 8.7* 8.1* 8.4* 8.7*  PHOS  --   --   --  2.9   GFR: Estimated Creatinine Clearance: 19.1 mL/min (A) (by C-G formula based on SCr of 3.67 mg/dL (H)). Liver Function Tests: Recent Labs  Lab 11/30/17 1731 12/02/17 0429  AST 48*  --   ALT 16*  --   ALKPHOS 184*  --   BILITOT 2.5*  --   PROT 8.0  --   ALBUMIN 3.1* 2.6*   Recent Labs  Lab 11/30/17 1731  LIPASE 69*   Recent Labs  Lab 11/26/17 0831 11/30/17 1731  AMMONIA 39* 50*   Coagulation Profile: Recent Labs  Lab 11/26/17 0831 11/30/17 1731 12/01/17 0509 12/02/17 0429  INR 1.73 1.92 1.72 1.61   Cardiac Enzymes: No results for input(s): CKTOTAL, CKMB, CKMBINDEX, TROPONINI in the last 168 hours. CBG: Recent Labs  Lab 12/02/17 0837 12/02/17 1248 12/02/17 1358 12/02/17 1818 12/02/17 2311  GLUCAP 148* 141* 104* 105* 130*   Urine analysis:    Component Value Date/Time   COLORURINE AMBER (A) 07/25/2017 1806   APPEARANCEUR HAZY (A) 07/25/2017 1806   LABSPEC 1.018 07/25/2017 1806   PHURINE 5.0 07/25/2017 1806   GLUCOSEU NEGATIVE 07/25/2017 1806   HGBUR MODERATE (A) 07/25/2017 1806   BILIRUBINUR NEGATIVE 07/25/2017 1806   KETONESUR NEGATIVE 07/25/2017 1806   PROTEINUR 100 (A) 07/25/2017 1806   NITRITE NEGATIVE 07/25/2017 1806   LEUKOCYTESUR NEGATIVE 07/25/2017 1806     Radiology Studies: Reviewed images personally in health database    Scheduled Meds: . allopurinol  150 mg Oral  Daily  . atorvastatin  20 mg Oral Daily  . benzonatate  200 mg Oral TID  . calcium acetate  2,001 mg Oral TID WC  . darbepoetin (ARANESP) injection - DIALYSIS  100 mcg Intravenous Q Wed-HD  . docusate sodium  100 mg Oral BID  . insulin aspart  0-9 Units Subcutaneous TID WC  . metoprolol succinate  25 mg Oral Daily  . midodrine  10 mg Oral TID WC  . multivitamin  1 tablet Oral QHS  . pantoprazole  40 mg Oral Daily  . sodium chloride flush  3 mL Intravenous Q12H  . sodium chloride flush  3 mL Intravenous Q12H  . Warfarin - Pharmacist Dosing Inpatient   Does not apply q1800   Continuous Infusions: . sodium chloride    . sodium chloride    . sodium chloride       LOS: 2 days    Time spent: [place], MD [facility] [phone]   If 7PM-7AM, please contact night-coverage  [email or web address] Password [facility] 12/03/2017, 7:48 AM

## 2017-12-04 LAB — CBC WITH DIFFERENTIAL/PLATELET
Basophils Absolute: 0.1 10*3/uL (ref 0.0–0.1)
Basophils Relative: 1 %
Eosinophils Absolute: 0.7 10*3/uL (ref 0.0–0.7)
Eosinophils Relative: 13 %
HEMATOCRIT: 30 % — AB (ref 39.0–52.0)
HEMOGLOBIN: 10.1 g/dL — AB (ref 13.0–17.0)
LYMPHS ABS: 1.4 10*3/uL (ref 0.7–4.0)
Lymphocytes Relative: 25 %
MCH: 25.7 pg — ABNORMAL LOW (ref 26.0–34.0)
MCHC: 33.7 g/dL (ref 30.0–36.0)
MCV: 76.3 fL — ABNORMAL LOW (ref 78.0–100.0)
MONOS PCT: 18 %
Monocytes Absolute: 1 10*3/uL (ref 0.1–1.0)
NEUTROS PCT: 43 %
Neutro Abs: 2.5 10*3/uL (ref 1.7–7.7)
PLATELETS: 122 10*3/uL — AB (ref 150–400)
RBC: 3.93 MIL/uL — AB (ref 4.22–5.81)
RDW: 17.5 % — AB (ref 11.5–15.5)
WBC: 5.8 10*3/uL (ref 4.0–10.5)

## 2017-12-04 LAB — GLUCOSE, CAPILLARY
GLUCOSE-CAPILLARY: 122 mg/dL — AB (ref 65–99)
Glucose-Capillary: 107 mg/dL — ABNORMAL HIGH (ref 65–99)
Glucose-Capillary: 130 mg/dL — ABNORMAL HIGH (ref 65–99)

## 2017-12-04 LAB — COMPREHENSIVE METABOLIC PANEL
ALT: 13 U/L — AB (ref 17–63)
ANION GAP: 8 (ref 5–15)
AST: 27 U/L (ref 15–41)
Albumin: 2.6 g/dL — ABNORMAL LOW (ref 3.5–5.0)
Alkaline Phosphatase: 166 U/L — ABNORMAL HIGH (ref 38–126)
BUN: 8 mg/dL (ref 6–20)
CHLORIDE: 99 mmol/L — AB (ref 101–111)
CO2: 28 mmol/L (ref 22–32)
Calcium: 8.5 mg/dL — ABNORMAL LOW (ref 8.9–10.3)
Creatinine, Ser: 2.13 mg/dL — ABNORMAL HIGH (ref 0.61–1.24)
GFR calc non Af Amer: 32 mL/min — ABNORMAL LOW (ref >60)
GFR, EST AFRICAN AMERICAN: 38 mL/min — AB (ref >60)
Glucose, Bld: 100 mg/dL — ABNORMAL HIGH (ref 65–99)
POTASSIUM: 3.9 mmol/L (ref 3.5–5.1)
SODIUM: 135 mmol/L (ref 135–145)
Total Bilirubin: 1.9 mg/dL — ABNORMAL HIGH (ref 0.3–1.2)
Total Protein: 7.4 g/dL (ref 6.5–8.1)

## 2017-12-04 LAB — PROTIME-INR
INR: 2.35
PROTHROMBIN TIME: 25.5 s — AB (ref 11.4–15.2)

## 2017-12-04 MED ORDER — ALTEPLASE 2 MG IJ SOLR: 2.0000 mg | Freq: Once | INTRAMUSCULAR | Status: DC | PRN

## 2017-12-04 MED ORDER — LIDOCAINE HCL (PF) 1 % IJ SOLN: 5.0000 mL | INTRAMUSCULAR | Status: DC | PRN

## 2017-12-04 MED ORDER — HEPARIN SODIUM (PORCINE) 1000 UNIT/ML DIALYSIS: 1000.0000 [IU] | INTRAMUSCULAR | Status: DC | PRN

## 2017-12-04 MED ORDER — LIDOCAINE-PRILOCAINE 2.5-2.5 % EX CREA: 1.0000 "application " | TOPICAL_CREAM | CUTANEOUS | Status: DC | PRN

## 2017-12-04 MED ORDER — SODIUM CHLORIDE 0.9 % IV SOLN: 100.0000 mL | INTRAVENOUS | Status: DC | PRN

## 2017-12-04 MED ORDER — METOPROLOL SUCCINATE ER 25 MG PO TB24
12.5000 mg | ORAL_TABLET | Freq: Two times a day (BID) | ORAL | Status: DC
  Administered 2017-12-04 – 2017-12-07 (×7): 12.5 mg via ORAL
  Filled 2017-12-04 (×7): qty 1

## 2017-12-04 MED ORDER — PENTAFLUOROPROP-TETRAFLUOROETH EX AERO: 1.0000 "application " | INHALATION_SPRAY | CUTANEOUS | Status: DC | PRN

## 2017-12-04 MED ORDER — HEPARIN SODIUM (PORCINE) 1000 UNIT/ML DIALYSIS: 100.0000 [IU]/kg | INTRAMUSCULAR | Status: DC | PRN

## 2017-12-04 MED ORDER — DARBEPOETIN ALFA 100 MCG/0.5ML IJ SOSY: 100.0000 ug | PREFILLED_SYRINGE | INTRAMUSCULAR | Status: DC

## 2017-12-04 MED ORDER — METOPROLOL TARTRATE 5 MG/5ML IV SOLN
5.0000 mg | Freq: Once | INTRAVENOUS | Status: AC
  Administered 2017-12-04: 5 mg via INTRAVENOUS
  Filled 2017-12-04: qty 5

## 2017-12-04 MED ORDER — WARFARIN SODIUM 2.5 MG PO TABS
2.5000 mg | ORAL_TABLET | Freq: Once | ORAL | Status: AC
  Administered 2017-12-04: 2.5 mg via ORAL
  Filled 2017-12-04: qty 1

## 2017-12-04 NOTE — Procedures (Signed)
I was present at this session.  I have reviewed the session itself and made appropriate changes.  HD via PC, bp low to start, will limit vol off. Has CHF Mauricia Area 1/18/20197:28 PM

## 2017-12-04 NOTE — Progress Notes (Signed)
Subjective: Interval History: has no complaint , cough better.  Objective: Vital signs in last 24 hours: Temp:  [97.7 F (36.5 C)-98.7 F (37.1 C)] 98.6 F (37 C) (01/18 0740) Pulse Rate:  [60-117] 103 (01/18 0740) Resp:  [15-25] 22 (01/18 0740) BP: (89-128)/(59-88) 128/88 (01/18 0740) SpO2:  [94 %-100 %] 95 % (01/18 0740) Weight change:   Intake/Output from previous day: 01/17 0701 - 01/18 0700 In: 480 [P.O.:480] Out: 2500  Intake/Output this shift: No intake/output data recorded.  General appearance: alert, cooperative, no distress and pale Resp: rales bibasilar and rhonchi bibasilar Chest wall: IJ cath Cardio: irregularly irregular rhythm and systolic murmur: holosystolic 2/6, blowing at apex, crisp valve sounds,  GI: soft, liver down 5 cm, pos bs Extremities: edema 1+ and AVF LUA  Lab Results: Recent Labs    12/03/17 0722 12/04/17 0450  WBC 6.0 5.8  HGB 9.9* 10.1*  HCT 29.4* 30.0*  PLT 101* 122*   BMET:  Recent Labs    12/02/17 0429 12/04/17 0450  NA 129* 135  K 5.0 3.9  CL 95* 99*  CO2 22 28  GLUCOSE 111* 100*  BUN 37* 8  CREATININE 3.67* 2.13*  CALCIUM 8.7* 8.5*   No results for input(s): PTH in the last 72 hours. Iron Studies: No results for input(s): IRON, TIBC, TRANSFERRIN, FERRITIN in the last 72 hours.  Studies/Results: Dg Chest 2 View  Result Date: 12/03/2017 CLINICAL DATA:  Patient admitted 12/01/2017 with chest pain and shortness of breath. EXAM: CHEST  2 VIEW COMPARISON:  Single-view of the chest 11/30/2017. PA and lateral chest 11/23/2017. FINDINGS: Right pleural effusion and basilar airspace disease have markedly worsened. The patient has a small left effusion and mild basilar airspace disease. Cardiomegaly and pulmonary vascular congestion are noted. Aortic atherosclerosis is seen. Dialysis catheter is in place. No acute bony abnormality. IMPRESSION: Marked worsening of a large right pleural effusion and basilar airspace disease which could  be atelectasis or pneumonia. Very small left pleural effusion and basilar atelectasis. Cardiomegaly and pulmonary vascular congestion. Electronically Signed   By: Inge Rise M.D.   On: 12/03/2017 10:39    I have reviewed the patient's current medications.  Assessment/Plan: 1 ESRD cont to lower vol.  Slow with low bp 2 Anemia esa 3 DM controlled 4 HPTH vit D 5 Afib rate controlled 6 CM 7 MVR, TV repair, MAZE P HD, esa, mido, anticoag    LOS: 3 days   Jeneen Rinks Travis Purk 12/04/2017,9:13 AM

## 2017-12-04 NOTE — Progress Notes (Signed)
Patient completed AD forms. Had notary and witnesses come and do signatures.  Gave copy to unit secretary for his chart and original and 2 copies to patient. Conard Novak, Chaplain   12/04/17 1100  Clinical Encounter Type  Visited With Patient  Visit Type Initial;Other (Comment) (Completed Advance Directives)  Referral From Physician  Consult/Referral To Chaplain  Stress Factors  Patient Stress Factors None identified  Family Stress Factors None identified  Advance Directives (For Healthcare)  Does Patient Have a Medical Advance Directive? Yes

## 2017-12-04 NOTE — Evaluation (Signed)
Physical Therapy Evaluation Patient Details Name: Michael [name] MRN: [id number] DOB: Jun 19, 1961 Today's Date: 12/04/2017   History of Present Illness  Michael [name] is a 57 y.o. male with medical history significant for end-stage renal disease, chronic pain, atrial fibrillation on warfarin, and chronic thrombocytopenia, now presenting to the emergency department from his dialysis center for evaluation of chest pain and shortness of breath.  Clinical Impression  Patient presents with general deconditioning due to bedrest from acute illness limiting independence and safety with mobility and will benefit from skilled PT in the acute setting to allow return home with caregiver help and likely no follow up PT at this time.      Follow Up Recommendations No PT follow up    Equipment Recommendations  Rolling walker with 5" wheels    Recommendations for Other Services       Precautions / Restrictions Precautions Precautions: Fall      Mobility  Bed Mobility Overal bed mobility: Needs Assistance Bed Mobility: Supine to Sit     Supine to sit: Supervision     General bed mobility comments: for lines, etc  Transfers Overall transfer level: Needs assistance Equipment used: Rolling walker (2 wheeled) Transfers: Sit to/from Stand Sit to Stand: Supervision         General transfer comment: assist for safety/lines/balance  Ambulation/Gait Ambulation/Gait assistance: Min guard;+2 safety/equipment(chair following) Ambulation Distance (Feet): 100 Feet Assistive device: Rolling walker (2 wheeled) Gait Pattern/deviations: Step-through pattern;Decreased stride length     General Gait Details: states feeling well despite using mask outside room and ambulating on RA  Stairs            Wheelchair Mobility    Modified Rankin (Stroke Patients Only)       Balance Overall balance assessment: Needs assistance   Sitting balance-Leahy Scale: Good       Standing  balance-Leahy Scale: Fair                               Pertinent Vitals/Pain Pain Assessment: No/denies pain    Home Living Family/patient expects to be discharged to:: Private residence   Available Help at Discharge: Friend(s);Available PRN/intermittently Type of Home: Apartment Home Access: Stairs to enter Entrance Stairs-Rails: None Entrance Stairs-Number of Steps: 2 Home Layout: One level Home Equipment: Cane - single point;Shower seat;Shower seat - built in Additional Comments: states "Raquel Sarna" his caregiver stays with him and provides assist with meals, cleaning and transportaions    Prior Function Level of Independence: Independent with assistive device(s)         Comments: Cane for mobility, independent with basic ADL     Hand Dominance        Extremity/Trunk Assessment   Upper Extremity Assessment Upper Extremity Assessment: Defer to OT evaluation    Lower Extremity Assessment Lower Extremity Assessment: Overall WFL for tasks assessed       Communication   Communication: Prefers language other than English(La[place]ian, but good understanding of Englisj)  Cognition Arousal/Alertness: Awake/alert Behavior During Therapy: Flat affect Overall Cognitive Status: No family/caregiver present to determine baseline cognitive functioning                                        General Comments      Exercises     Assessment/Plan    PT Assessment Patient needs continued PT services  PT Problem List Decreased mobility;Decreased balance;Decreased knowledge of use of DME       PT Treatment Interventions DME instruction;Gait training;Therapeutic exercise;Therapeutic activities;Functional mobility training;Balance training;Patient/family education    PT Goals (Current goals can be found in the Care Plan section)  Acute Rehab PT Goals Patient Stated Goal: to get stronger PT Goal Formulation: With patient Time For Goal Achievement:  12/11/17 Potential to Achieve Goals: Good    Frequency Min 3X/week   Barriers to discharge        Co-evaluation               AM-PAC PT "6 Clicks" Daily Activity  Outcome Measure Difficulty turning over in bed (including adjusting bedclothes, sheets and blankets)?: A Little Difficulty moving from lying on back to sitting on the side of the bed? : A Little Difficulty sitting down on and standing up from a chair with arms (e.g., wheelchair, bedside commode, etc,.)?: Unable Help needed moving to and from a bed to chair (including a wheelchair)?: A Little Help needed walking in hospital room?: A Little Help needed climbing 3-5 steps with a railing? : A Little 6 Click Score: 16    End of Session Equipment Utilized During Treatment: Gait belt Activity Tolerance: Patient tolerated treatment well Patient left: in chair;with chair alarm set;with call bell/phone within reach Nurse Communication: Mobility status      Time: 1438-1500 PT Time Calculation (min) (ACUTE ONLY): 22 min   Charges:   PT Evaluation $PT Eval Moderate Complexity: 1 Mod     PT G CodesMagda Kiel, [place] [phone] 12/04/2017   Reginia Naas 12/04/2017, 4:40 PM

## 2017-12-04 NOTE — Consult Note (Signed)
   [facility] CM Inpatient Consult   12/04/2017  Jahquez Wilcock 1960/12/18 [id number]    [facility] Care Management follow up.  Spoke with patient at bedside to discuss ongoing [place] Management services. He remains agreeable.  Discussed [facility] RNCM follow up. Requested that he allow [place] team use the interpreter line to better foster communication upon transition of care calls. Mr. Studley agreeable to this.   Will make referral for [facility] RNCM transition of care and will update [facility] Pharmacist.  Inpatient RNCM aware [place] Management is active.    [name], MSN-Ed, RN,BSN [facility] Liaison [phone]

## 2017-12-04 NOTE — Progress Notes (Signed)
Hospitalist progress note   Michael [name]  ZOX:[id number] DOB: [birth year] DOA: 11/30/2017 PCP: [name], MD   Specialists:  Nephro  Brief Narrative:  47 M, ESRD MWF S-c/b hypotension on midodrine, chronic pain, afib chads score ~4 on warfarin, hepatic cirrhosis + encephalopathy chronic thrombocytopenia, CAD s/p CABG+right-sided heart failure-status post bioprosthetic MVR + tricuspid repair 2012 [place] CLT, HLD, chronic respiratory failure, type 2 diabetes mellitus previous-colonoscopy 07/16/2017 sessile polyps status post removal/Int hemorrhoids, prior gout PEA arrest 07/2017 status post CPR-at that admission left heart cath showing chronically occluded distal RDA collateralized with no intervention needed  Recent admission 1/7-1/10 with hepatic encephalopathy and placed on lactulose at the time Returns to hospital 1/15 shortness of breath-febrile 38.9 tachycardic 110 CXR?  Edema flu negative, lactic acid 2.6 troponins normal admitted with Sirs suspected sepsis    Assessment & Plan:   Assessment:  The primary encounter diagnosis was Tachycardia. Diagnoses of Precordial pain, Fever, unspecified fever cause, Cough, Shortness of breath, and PNA (pneumonia) were also pertinent to this visit.  Sepsis-coronovirus +, unlikely source-catheter left chest -de-escalated vanc/ceftri ->off.  BC X2 1/15 NG TD-lactic acid 2.6-2.5.  transfer to tele today.  Adding hycodan for cough suppression 1/17  ESRD hypovolemia MWFS-continue PhosLo 3 times daily-appreciate input Consider lowering of EDW  hypotensive 1/16 probably related to sepsis secondary to coronavirus + constitutional state-250 cc IV fluid bolus given 1/16, extra dose Midodrine-blood pressures have improved marginally on 1/17  Atrial fibrillation, mitral valve repair in 2012, CABG status post cath 07/2017 with PEA arrest, chads score >4-continue Coumadin-metoprolol restarted 12.5 xl-monitor rates as elevated 120 this am--monitor bp as well-still  adjusting meds and not ready for d/c  Anemia renal disease-hemoglobin 10 but micocytic-OP colonoscopy and work-up Iron/ESa as per Nephro  Chronic pain continue OxyIR as needed Voltaren  Chronic respiratory failure on oxygen-wean as tolerated  Hyperlipidemia-continue Lipitor 20 daily  Liver cirrhosis idiopathic + thrombocytopenia-continue lactulose 30 ml twice daily, hydroxyzine 20 5/2-1 as needed itching 3 times daily-rpt LFt in am--TCP has improved likely coinciding with recovery from viral illness  Reflux, prior colonic polyps-continue pantoprazole 40 daily  Gout continue 150 mg allopurinol daily, colchicine 0.5 twice  Weekly  DM ty 11 not on meds-renal card diet-SSI   DVT prophylaxis: lovenox  Code Status:   full   Family Communication: no family Disposition Plan:  inpatient   Consultants:   Nephrology  Procedures:   ?  Antimicrobials:   Vancomycin 1/15-1/16  ceftriaxone 1/15-1/16  Subjective:  Much better still cough No sputum desatting without oxygen HR a little up no fever no chills  Objective: Vitals:   12/03/17 2300 12/04/17 0300 12/04/17 0740 12/04/17 0900  BP:  116/78 128/88 (!) 130/92  Pulse:  83 (!) 103 (!) 56  Resp: (!) 25 19 (!) 22 (!) 23  Temp: 98.3 F (36.8 C) 98.3 F (36.8 C) 98.6 F (37 C)   TempSrc: Oral Oral Oral   SpO2:  96% 95% (!) 83%  Weight:      Height:        Intake/Output Summary (Last 24 hours) at 12/04/2017 1003 Last data filed at 12/03/2017 2300 Gross per 24 hour  Intake 240 ml  Output 2500 ml  Net -2260 ml   Filed Weights   12/02/17 0641 12/02/17 0921 12/02/17 1336  Weight: 66.9 kg (147 lb 7.8 oz) 66.9 kg (147 lb 7.8 oz) 66.4 kg (146 lb 6.2 oz)    Examination:  eomi ncat in nad, eatign breakfast no distress  Chest R postsslight dull Neuro intact moves 4 limbs equally--awake and alert in nad No jvd No deficit    Data Reviewed: I have personally reviewed following labs and imaging studies  CBC: Recent Labs   Lab 11/30/17 1627 12/01/17 0509 12/02/17 0429 12/03/17 0722 12/04/17 0450  WBC 7.1 7.4 7.5 6.0 5.8  NEUTROABS  --  4.2  --   --  2.5  HGB 11.1* 10.3* 9.7* 9.9* 10.1*  HCT 33.0* 30.8* 28.5* 29.4* 30.0*  MCV 78.0 77.0* 76.0* 76.0* 76.3*  PLT 56* 56* 67* 101* 431*   Basic Metabolic Panel: Recent Labs  Lab 11/30/17 1627 12/01/17 0509 12/02/17 0429 12/04/17 0450  NA 137 133* 129* 135  K 4.4 4.1 5.0 3.9  CL 101 97* 95* 99*  CO2 27 25 22 28   GLUCOSE 91 124* 111* 100*  BUN 15 23* 37* 8  CREATININE 2.58* 2.97* 3.67* 2.13*  CALCIUM 8.1* 8.4* 8.7* 8.5*  PHOS  --   --  2.9  --    GFR: Estimated Creatinine Clearance: 32.9 mL/min (A) (by C-G formula based on SCr of 2.13 mg/dL (H)). Liver Function Tests: Recent Labs  Lab 11/30/17 1731 12/02/17 0429 12/04/17 0450  AST 48*  --  27  ALT 16*  --  13*  ALKPHOS 184*  --  166*  BILITOT 2.5*  --  1.9*  PROT 8.0  --  7.4  ALBUMIN 3.1* 2.6* 2.6*   Recent Labs  Lab 11/30/17 1731  LIPASE 69*   Recent Labs  Lab 11/30/17 1731  AMMONIA 50*   Coagulation Profile: Recent Labs  Lab 11/30/17 1731 12/01/17 0509 12/02/17 0429 12/03/17 0722 12/04/17 0450  INR 1.92 1.72 1.61 2.10 2.35   Cardiac Enzymes: No results for input(s): CKTOTAL, CKMB, CKMBINDEX, TROPONINI in the last 168 hours. CBG: Recent Labs  Lab 12/02/17 2311 12/03/17 0834 12/03/17 1618 12/03/17 2120 12/04/17 0739  GLUCAP 130* 89 101* 129* 122*   Urine analysis:    Component Value Date/Time   COLORURINE AMBER (A) 07/25/2017 1806   APPEARANCEUR HAZY (A) 07/25/2017 1806   LABSPEC 1.018 07/25/2017 1806   PHURINE 5.0 07/25/2017 1806   GLUCOSEU NEGATIVE 07/25/2017 1806   HGBUR MODERATE (A) 07/25/2017 1806   BILIRUBINUR NEGATIVE 07/25/2017 1806   KETONESUR NEGATIVE 07/25/2017 1806   PROTEINUR 100 (A) 07/25/2017 1806   NITRITE NEGATIVE 07/25/2017 1806   LEUKOCYTESUR NEGATIVE 07/25/2017 1806     Radiology Studies: Reviewed images personally in health  database    Scheduled Meds: . allopurinol  150 mg Oral Daily  . atorvastatin  20 mg Oral Daily  . benzonatate  200 mg Oral TID  . calcium acetate  2,001 mg Oral TID WC  . [START ON 12/10/2017] darbepoetin (ARANESP) injection - DIALYSIS  100 mcg Intravenous Q Thu-HD  . docusate sodium  100 mg Oral BID  . insulin aspart  0-9 Units Subcutaneous TID WC  . metoprolol succinate  12.5 mg Oral BID  . midodrine  10 mg Oral TID WC  . multivitamin  1 tablet Oral QHS  . pantoprazole  40 mg Oral Daily  . sodium chloride flush  3 mL Intravenous Q12H  . sodium chloride flush  3 mL Intravenous Q12H  . warfarin  2.5 mg Oral ONCE-1800  . Warfarin - Pharmacist Dosing Inpatient   Does not apply q1800   Continuous Infusions: . sodium chloride       LOS: 3 days    Time spent: [address],  MD [facility] (P) [phone]   If 7PM-7AM, please contact night-coverage [email or web address] Password [email or web address] 12/04/2017, 10:03 AM

## 2017-12-04 NOTE — Progress Notes (Signed)
ANTICOAGULATION CONSULT NOTE - Initial Consult  Pharmacy Consult for Coumadin Indication: atrial fibrillation / bioprosthetic MV  No Known Allergies  Patient Measurements: Height: 5\' 5"  (165.1 cm) Weight: 146 lb 6.2 oz (66.4 kg) IBW/kg (Calculated) : 61.5  Assessment: 57 yo M presents on 1/15 with chest pain and SOB. On warfarin 2.5mg  daily exc fpr 3.75mg  on Wed/Sat PTA for AFib, bioprosthetic MV. CHADsVASC = 3. Admit INR 1.92; INR today back up to 2.35. Hgb 10.1, and plts low but up to 122. If drops below 50 should consider holding anticoag.  Goal of Therapy:  INR 2-3 Monitor platelets by anticoagulation protocol: Yes   Plan:  Give  Coumadin 2.5mg  PO x 1 tonight Monitor daily INR, CBC, s/s of bleed  Elenor Quinones, PharmD, [facility] Clinical Pharmacist Pager [phone] 12/04/2017 7:41 AM

## 2017-12-05 LAB — RENAL FUNCTION PANEL
ALBUMIN: 2.8 g/dL — AB (ref 3.5–5.0)
Anion gap: 11 (ref 5–15)
BUN: 6 mg/dL (ref 6–20)
CHLORIDE: 97 mmol/L — AB (ref 101–111)
CO2: 23 mmol/L (ref 22–32)
Calcium: 8.3 mg/dL — ABNORMAL LOW (ref 8.9–10.3)
Creatinine, Ser: 1.9 mg/dL — ABNORMAL HIGH (ref 0.61–1.24)
GFR calc Af Amer: 43 mL/min — ABNORMAL LOW (ref >60)
GFR calc non Af Amer: 37 mL/min — ABNORMAL LOW (ref >60)
GLUCOSE: 95 mg/dL (ref 65–99)
POTASSIUM: 4.6 mmol/L (ref 3.5–5.1)
Phosphorus: 1.8 mg/dL — ABNORMAL LOW (ref 2.5–4.6)
Sodium: 131 mmol/L — ABNORMAL LOW (ref 135–145)

## 2017-12-05 LAB — CULTURE, BLOOD (ROUTINE X 2)
Culture: NO GROWTH
Culture: NO GROWTH
SPECIAL REQUESTS: ADEQUATE
Special Requests: ADEQUATE

## 2017-12-05 LAB — GLUCOSE, CAPILLARY
GLUCOSE-CAPILLARY: 105 mg/dL — AB (ref 65–99)
Glucose-Capillary: 108 mg/dL — ABNORMAL HIGH (ref 65–99)
Glucose-Capillary: 213 mg/dL — ABNORMAL HIGH (ref 65–99)
Glucose-Capillary: 98 mg/dL (ref 65–99)

## 2017-12-05 LAB — CBC
HEMATOCRIT: 35.4 % — AB (ref 39.0–52.0)
HEMOGLOBIN: 12.4 g/dL — AB (ref 13.0–17.0)
MCH: 26.9 pg (ref 26.0–34.0)
MCHC: 35 g/dL (ref 30.0–36.0)
MCV: 76.8 fL — AB (ref 78.0–100.0)
Platelets: 116 10*3/uL — ABNORMAL LOW (ref 150–400)
RBC: 4.61 MIL/uL (ref 4.22–5.81)
RDW: 17.6 % — AB (ref 11.5–15.5)
WBC: 5.9 10*3/uL (ref 4.0–10.5)

## 2017-12-05 LAB — PROTIME-INR
INR: 2.15
PROTHROMBIN TIME: 23.9 s — AB (ref 11.4–15.2)

## 2017-12-05 MED ORDER — PENTAFLUOROPROP-TETRAFLUOROETH EX AERO: 1.0000 "application " | INHALATION_SPRAY | CUTANEOUS | Status: DC | PRN

## 2017-12-05 MED ORDER — SODIUM CHLORIDE 0.9 % IV SOLN: 100.0000 mL | INTRAVENOUS | Status: DC | PRN

## 2017-12-05 MED ORDER — ALTEPLASE 2 MG IJ SOLR: 2.0000 mg | Freq: Once | INTRAMUSCULAR | Status: DC | PRN

## 2017-12-05 MED ORDER — WARFARIN SODIUM 2.5 MG PO TABS
3.7500 mg | ORAL_TABLET | Freq: Once | ORAL | Status: AC
  Administered 2017-12-05: 18:00:00 3.75 mg via ORAL
  Filled 2017-12-05: qty 1

## 2017-12-05 MED ORDER — HEPARIN SODIUM (PORCINE) 1000 UNIT/ML DIALYSIS: 1000.0000 [IU] | INTRAMUSCULAR | Status: DC | PRN

## 2017-12-05 MED ORDER — HEPARIN SODIUM (PORCINE) 1000 UNIT/ML DIALYSIS: 100.0000 [IU]/kg | INTRAMUSCULAR | Status: DC | PRN

## 2017-12-05 MED ORDER — LIDOCAINE HCL (PF) 1 % IJ SOLN: 5.0000 mL | INTRAMUSCULAR | Status: DC | PRN

## 2017-12-05 MED ORDER — DIGOXIN 125 MCG PO TABS
0.1250 mg | ORAL_TABLET | Freq: Once | ORAL | Status: AC
  Administered 2017-12-05: 0.125 mg via ORAL
  Filled 2017-12-05: qty 1

## 2017-12-05 MED ORDER — SODIUM CHLORIDE 0.9 % IV SOLN
100.0000 mL | INTRAVENOUS | Status: DC | PRN
Start: 2017-12-05 — End: 2017-12-05

## 2017-12-05 MED ORDER — MIDODRINE HCL 5 MG PO TABS
ORAL_TABLET | ORAL | Status: AC
  Administered 2017-12-05: 10 mg via ORAL
  Filled 2017-12-05: qty 2

## 2017-12-05 MED ORDER — LIDOCAINE-PRILOCAINE 2.5-2.5 % EX CREA: 1.0000 "application " | TOPICAL_CREAM | CUTANEOUS | Status: DC | PRN

## 2017-12-05 MED ORDER — METOPROLOL TARTRATE 5 MG/5ML IV SOLN
5.0000 mg | Freq: Once | INTRAVENOUS | Status: AC
  Administered 2017-12-05: 5 mg via INTRAVENOUS
  Filled 2017-12-05: qty 5

## 2017-12-05 NOTE — Progress Notes (Signed)
Pt taken to dialysis via bed by transporter.  No s/s of distress at this time.  Assessment completed prior to leaving department.

## 2017-12-05 NOTE — Plan of Care (Signed)
  Progressing Education: Knowledge of General Education information will improve 12/05/2017 0233 - Progressing by Randal Buba, RN Health Behavior/Discharge Planning: Ability to manage health-related needs will improve 12/05/2017 0233 - Progressing by Randal Buba, RN Clinical Measurements: Ability to maintain clinical measurements within normal limits will improve 12/05/2017 0233 - Progressing by Randal Buba, RN Will remain free from infection 12/05/2017 0233 - Progressing by Randal Buba, RN Cardiovascular complication will be avoided 12/05/2017 0233 - Progressing by Randal Buba, RN Activity: Risk for activity intolerance will decrease 12/05/2017 0233 - Progressing by Randal Buba, RN Nutrition: Adequate nutrition will be maintained 12/05/2017 0233 - Progressing by Randal Buba, RN Coping: Level of anxiety will decrease 12/05/2017 0233 - Progressing by Randal Buba, RN Elimination: Will not experience complications related to bowel motility 12/05/2017 0233 - Progressing by Randal Buba, RN Will not experience complications related to urinary retention 12/05/2017 0233 - Progressing by Randal Buba, RN Pain Managment: General experience of comfort will improve 12/05/2017 0233 - Progressing by Randal Buba, RN Safety: Ability to remain free from injury will improve 12/05/2017 0233 - Progressing by Randal Buba, RN Skin Integrity: Risk for impaired skin integrity will decrease 12/05/2017 0233 - Progressing by Randal Buba, RN Spiritual Needs Ability to function at adequate level 12/05/2017 0233 - Progressing by Randal Buba, RN

## 2017-12-05 NOTE — Progress Notes (Signed)
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  Edema flu negative, lactic acid 2.6 troponins normal admitted with Sirs suspected sepsis    Assessment & Plan:   Assessment:  The primary encounter diagnosis was Tachycardia. Diagnoses of Precordial pain, Fever, unspecified fever cause, Cough, Shortness of breath, PNA (pneumonia), ESRD (end stage renal disease) ([place]), Hypervolemia, unspecified hypervolemia type, and Insulin dependent diabetes mellitus ([place]) were also pertinent to this visit.  Sepsis-coronovirus +, unlikely source-catheter left chest -de-escalated vanc/ceftri ->off.  BC X2 1/15 NG TD-lactic acid 2.6-2.5.  transfer to tele today.  Adding hycodan for cough suppression 1/17  ESRD hypovolemia MWFS-continue PhosLo 3 times daily-appreciate input Consider lowering of EDW  hypotensive 1/16 probably related to sepsis secondary to coronavirus + constitutional state-250 cc IV fluid bolus given 1/16, extra dose Midodrine-blood pressures have improved marginally on 1/17-see below Re Cardiac issues  Atrial fibrillation control complicated by Hypotension , mitral  valve repair in 2012, CABG status post cath 07/2017 with PEA arrest, chads score >4- continue Coumadin-metoprolol restarted 12.5 xl-monitor rates as elevated 120 this am--monitor bp as well- will add low dose Digoxin 0.125-Amiodarone relatively contraindicated given liver pathology-cannot titrate BB and with h/o CAD not best candidate for Cardizem--will need DIg level and adjustment as OP  Anemia renal disease-hemoglobin 10 but micocytic-OP colonoscopy and work-up Iron/ESa as per Nephro  Chronic pain continue OxyIR as needed Voltaren  Chronic respiratory failure on oxygen-wean as tolerated--looks improved overall  Hyperlipidemia-continue Lipitor 20 daily  Liver cirrhosis idiopathic + thrombocytopenia-continue lactulose 30 ml twice daily, hydroxyzine 20 5/2-1 as needed itching 3 times daily--TCP has improved likely2/2 recovery viral illness  Reflux, prior colonic polyps-continue pantoprazole 40 daily  Gout continue 150 mg allopurinol daily, colchicine 0.5 twice  Weekly  DM ty 11 not on meds-renal card diet-SSI   DVT prophylaxis: lovenox  Code Status:   full   Family Communication: no family Disposition Plan:  inpatient   Consultants:   Nephrology  Procedures:   ?  Antimicrobials:   Vancomycin 1/15-1/16  ceftriaxone 1/15-1/16  Subjective:  Seen on HD Overall improved Note given IV metoprolol overnight for fast HR--Has constitutionally low BP making Rate control difficult No fever no chills no n/v No cough now Overall improved   Objective: Vitals:   12/05/17 0000 12/05/17 0100 12/05/17 0300 12/05/17 0324  BP: (!) 127/101 131/84 133/89 133/89  Pulse: (!) 137 94 93 93  Resp: (!) 25 16 (!) 24 (!) 21  Temp:    97.8 F (36.6 C)  TempSrc:    Oral  SpO2: 94% 92% 96% 90%  Weight:    65 kg (143 lb 4.8 oz)  Height:        Intake/Output Summary (Last 24 hours) at 12/05/2017 0835 Last data filed at 12/04/2017 2217 Gross per 24 hour  Intake  1000 ml  Output 2400 ml   Net -1400 ml   Filed Weights   12/04/17 1802 12/04/17 2217 12/05/17 0324  Weight: 66.5 kg (146 lb 9.7 oz) 64.4 kg (141 lb 15.6 oz) 65 kg (143 lb 4.8 oz)    Examination:  eomi ncat in nad, eatign crackers at HD Chest seem clearer Neuro intact moves 4 limbs equally--awake and alert in nad No jvd No deficit    Data Reviewed: I have personally reviewed following labs and imaging studies  CBC: Recent Labs  Lab 12/01/17 0509 12/02/17 0429 12/03/17 0722 12/04/17 0450 12/05/17 0657  WBC 7.4 7.5 6.0 5.8 PENDING  NEUTROABS 4.2  --   --  2.5  --   HGB 10.3* 9.7* 9.9* 10.1* PENDING  HCT 30.8* 28.5* 29.4* 30.0* PENDING  MCV 77.0* 76.0* 76.0* 76.3* PENDING  PLT 56* 67* 101* 122* 814*   Basic Metabolic Panel: Recent Labs  Lab 11/30/17 1627 12/01/17 0509 12/02/17 0429 12/04/17 0450  NA 137 133* 129* 135  K 4.4 4.1 5.0 3.9  CL 101 97* 95* 99*  CO2 27 25 22 28   GLUCOSE 91 124* 111* 100*  BUN 15 23* 37* 8  CREATININE 2.58* 2.97* 3.67* 2.13*  CALCIUM 8.1* 8.4* 8.7* 8.5*  PHOS  --   --  2.9  --    GFR: Estimated Creatinine Clearance: 32.9 mL/min (A) (by C-G formula based on SCr of 2.13 mg/dL (H)). Liver Function Tests: Recent Labs  Lab 11/30/17 1731 12/02/17 0429 12/04/17 0450  AST 48*  --  27  ALT 16*  --  13*  ALKPHOS 184*  --  166*  BILITOT 2.5*  --  1.9*  PROT 8.0  --  7.4  ALBUMIN 3.1* 2.6* 2.6*   Recent Labs  Lab 11/30/17 1731  LIPASE 69*   Recent Labs  Lab 11/30/17 1731  AMMONIA 50*   Coagulation Profile: Recent Labs  Lab 12/01/17 0509 12/02/17 0429 12/03/17 0722 12/04/17 0450 12/05/17 0657  INR 1.72 1.61 2.10 2.35 2.15   Cardiac Enzymes: No results for input(s): CKTOTAL, CKMB, CKMBINDEX, TROPONINI in the last 168 hours. CBG: Recent Labs  Lab 12/03/17 2120 12/04/17 0739 12/04/17 1210 12/04/17 1632 12/05/17 0041  GLUCAP 129* 122* 107* 130* 213*   Urine analysis:    Component Value Date/Time   COLORURINE AMBER (A) 07/25/2017 1806    APPEARANCEUR HAZY (A) 07/25/2017 1806   LABSPEC 1.018 07/25/2017 1806   PHURINE 5.0 07/25/2017 1806   GLUCOSEU NEGATIVE 07/25/2017 1806   HGBUR MODERATE (A) 07/25/2017 1806   BILIRUBINUR NEGATIVE 07/25/2017 1806   KETONESUR NEGATIVE 07/25/2017 1806   PROTEINUR 100 (A) 07/25/2017 1806   NITRITE NEGATIVE 07/25/2017 1806   LEUKOCYTESUR NEGATIVE 07/25/2017 1806     Radiology Studies: Reviewed images personally in health database    Scheduled Meds: . allopurinol  150 mg Oral Daily  . atorvastatin  20 mg Oral Daily  . benzonatate  200 mg Oral TID  . calcium acetate  2,001 mg Oral TID WC  . [START ON 12/10/2017] darbepoetin (ARANESP) injection - DIALYSIS  100 mcg Intravenous Q Thu-HD  . docusate sodium  100 mg Oral BID  . insulin aspart  0-9 Units Subcutaneous TID WC  . metoprolol succinate  12.5 mg Oral BID  . midodrine  10 mg Oral TID WC  . multivitamin  1 tablet Oral QHS  . pantoprazole  40 mg Oral Daily  . sodium chloride flush  3 mL Intravenous Q12H  . sodium chloride  flush  3 mL Intravenous Q12H  . Warfarin - Pharmacist Dosing Inpatient   Does not apply q1800   Continuous Infusions: . sodium chloride    . sodium chloride    . sodium chloride       LOS: 4 days    Time spent: Friona, MD [facility]   If 7PM-7AM, please contact night-coverage [email or web address] Password [facility] 12/05/2017, 8:35 AM

## 2017-12-05 NOTE — Progress Notes (Signed)
Pt returned from dialysis to room 3M04.

## 2017-12-05 NOTE — Progress Notes (Signed)
Subjective: Interval History: has complaints cough.  Objective: Vital signs in last 24 hours: Temp:  [97.6 F (36.4 C)-98.9 F (37.2 C)] 97.8 F (36.6 C) (01/19 0324) Pulse Rate:  [88-137] 93 (01/19 0324) Resp:  [16-28] 21 (01/19 0324) BP: (113-152)/(72-106) 133/89 (01/19 0324) SpO2:  [90 %-100 %] 90 % (01/19 0324) Weight:  [64.4 kg (141 lb 15.6 oz)-66.5 kg (146 lb 9.7 oz)] 65 kg (143 lb 4.8 oz) (01/19 0324) Weight change:   Intake/Output from previous day: 01/18 0701 - 01/19 0700 In: 1000 [P.O.:1000] Out: 2400  Intake/Output this shift: No intake/output data recorded.  General appearance: alert, cooperative, no distress and pale Resp: rales bibasilar Chest wall: RIJ cath Cardio: irregularly irregular rhythm, systolic murmur: holosystolic 2/6, blowing at apex and rate 120s GI: liver down 4 cm Extremities: AVF LUA  Lab Results: Recent Labs    12/04/17 0450 12/05/17 0657  WBC 5.8 5.9  HGB 10.1* 12.4*  HCT 30.0* 35.4*  PLT 122* 116*   BMET:  Recent Labs    12/04/17 0450 12/05/17 0657  NA 135 131*  K 3.9 4.6  CL 99* 97*  CO2 28 23  GLUCOSE 100* 95  BUN 8 6  CREATININE 2.13* 1.90*  CALCIUM 8.5* 8.3*   No results for input(s): PTH in the last 72 hours. Iron Studies: No results for input(s): IRON, TIBC, TRANSFERRIN, FERRITIN in the last 72 hours.  Studies/Results: Dg Chest 2 View  Result Date: 12/03/2017 CLINICAL DATA:  Patient admitted 12/01/2017 with chest pain and shortness of breath. EXAM: CHEST  2 VIEW COMPARISON:  Single-view of the chest 11/30/2017. PA and lateral chest 11/23/2017. FINDINGS: Right pleural effusion and basilar airspace disease have markedly worsened. The patient has a small left effusion and mild basilar airspace disease. Cardiomegaly and pulmonary vascular congestion are noted. Aortic atherosclerosis is seen. Dialysis catheter is in place. No acute bony abnormality. IMPRESSION: Marked worsening of a large right pleural effusion and basilar  airspace disease which could be atelectasis or pneumonia. Very small left pleural effusion and basilar atelectasis. Cardiomegaly and pulmonary vascular congestion. Electronically Signed   By: Inge Rise M.D.   On: 12/03/2017 10:39    I have reviewed the patient's current medications.  Assessment/Plan: 1 ESRD HD today to lower vol, serial HD 2 Anemia stable 3 Afib per primary , agree with Dig 4 DM controlled 5 CM with MVR/TV repair/MAZE 6 CAD P HD, dig, control bs,     LOS: 4 days   Jeneen Rinks Jaran Sainz 12/05/2017,9:16 AM

## 2017-12-05 NOTE — Procedures (Signed)
I was present at this session.  I have reviewed the session itself and made appropriate changes.  HD via PC.  bp now 120s but freq low.  Cath function well  Jeneen Rinks Havanah Nelms 1/19/20199:15 AM

## 2017-12-05 NOTE — Progress Notes (Signed)
ANTICOAGULATION CONSULT NOTE - Follow Up Consult  Pharmacy Consult for warfarin Indication: atrial fibrillation  No Known Allergies  Patient Measurements: Height: 5\' 5"  (165.1 cm) Weight: 139 lb 12.4 oz (63.4 kg) IBW/kg (Calculated) : 61.5  Vital Signs: Temp: 97.5 F (36.4 C) (01/19 0735) Temp Source: Oral (01/19 0735) BP: 117/76 (01/19 0900) Pulse Rate: 112 (01/19 0900)  Labs: Recent Labs    12/03/17 0722 12/04/17 0450 12/05/17 0657  HGB 9.9* 10.1* 12.4*  HCT 29.4* 30.0* 35.4*  PLT 101* 122* 116*  LABPROT 23.4* 25.5* 23.9*  INR 2.10 2.35 2.15  CREATININE  --  2.13* 1.90*    Estimated Creatinine Clearance: 36.9 mL/min (A) (by C-G formula based on SCr of 1.9 mg/dL (H)).   Medications:  Scheduled:  . allopurinol  150 mg Oral Daily  . atorvastatin  20 mg Oral Daily  . benzonatate  200 mg Oral TID  . calcium acetate  2,001 mg Oral TID WC  . [START ON 12/10/2017] darbepoetin (ARANESP) injection - DIALYSIS  100 mcg Intravenous Q Thu-HD  . digoxin  0.125 mg Oral Daily  . docusate sodium  100 mg Oral BID  . insulin aspart  0-9 Units Subcutaneous TID WC  . metoprolol succinate  12.5 mg Oral BID  . midodrine  10 mg Oral TID WC  . multivitamin  1 tablet Oral QHS  . pantoprazole  40 mg Oral Daily  . sodium chloride flush  3 mL Intravenous Q12H  . sodium chloride flush  3 mL Intravenous Q12H  . warfarin  3.75 mg Oral ONCE-1800  . Warfarin - Pharmacist Dosing Inpatient   Does not apply q1800   Infusions:  . sodium chloride    . sodium chloride    . sodium chloride      Assessment: 57 y/o male with ESRD on HD on warfarin PTA for afib, admitted with chest pain, SOB. Of note patient was recently discharged from hospital 1/10 for similar symptoms, and had not taken warfarin at home since discharge. INR on admit 1.9, now back in range using PTA regimen. INR today 2.15, down slightly from yesterday.  PTA regimen: warfarin 2.5 mg daily per HD clinic notes. Per anticoag  clinic notes dose was increased to 3.75 mg on Wed/Sat and 2.5 mg all other days.  Goal of Therapy:  INR 2-3 Monitor platelets by anticoagulation protocol: Yes   Plan:  Warfarin 3.75 mg PO x 1 Daily INR, CBC Monitor s/sx of bleeding   Charlene Brooke, PharmD PGY1 Pharmacy Resident Phone: [phone] After 3:30PM please call Main Pharmacy #[id number]= 12/05/2017,10:40 AM

## 2017-12-05 NOTE — Plan of Care (Signed)
  Clinical Measurements: Respiratory complications will improve 12/05/2017 2043 - Progressing by Irish Lack, RN   Clinical Measurements: Ability to maintain clinical measurements within normal limits will improve 12/05/2017 2043 - Progressing by Irish Lack, RN

## 2017-12-06 ENCOUNTER — Inpatient Hospital Stay (HOSPITAL_COMMUNITY): Payer: Medicare Other

## 2017-12-06 LAB — CBC WITH DIFFERENTIAL/PLATELET
Basophils Absolute: 0.1 10*3/uL (ref 0.0–0.1)
Basophils Relative: 1 %
EOS PCT: 9 %
Eosinophils Absolute: 0.6 10*3/uL (ref 0.0–0.7)
HCT: 31.5 % — ABNORMAL LOW (ref 39.0–52.0)
Hemoglobin: 10.6 g/dL — ABNORMAL LOW (ref 13.0–17.0)
LYMPHS ABS: 2 10*3/uL (ref 0.7–4.0)
LYMPHS PCT: 30 %
MCH: 26.2 pg (ref 26.0–34.0)
MCHC: 33.7 g/dL (ref 30.0–36.0)
MCV: 77.8 fL — AB (ref 78.0–100.0)
MONO ABS: 0.7 10*3/uL (ref 0.1–1.0)
Monocytes Relative: 11 %
Neutro Abs: 3.3 10*3/uL (ref 1.7–7.7)
Neutrophils Relative %: 49 %
PLATELETS: 132 10*3/uL — AB (ref 150–400)
RBC: 4.05 MIL/uL — ABNORMAL LOW (ref 4.22–5.81)
RDW: 17.7 % — AB (ref 11.5–15.5)
WBC: 6.6 10*3/uL (ref 4.0–10.5)

## 2017-12-06 LAB — COMPREHENSIVE METABOLIC PANEL
ALBUMIN: 2.6 g/dL — AB (ref 3.5–5.0)
ALT: 13 U/L — AB (ref 17–63)
AST: 27 U/L (ref 15–41)
Alkaline Phosphatase: 174 U/L — ABNORMAL HIGH (ref 38–126)
Anion gap: 9 (ref 5–15)
BILIRUBIN TOTAL: 2 mg/dL — AB (ref 0.3–1.2)
BUN: 7 mg/dL (ref 6–20)
CHLORIDE: 99 mmol/L — AB (ref 101–111)
CO2: 27 mmol/L (ref 22–32)
CREATININE: 2.15 mg/dL — AB (ref 0.61–1.24)
Calcium: 8.6 mg/dL — ABNORMAL LOW (ref 8.9–10.3)
GFR calc Af Amer: 37 mL/min — ABNORMAL LOW (ref >60)
GFR calc non Af Amer: 32 mL/min — ABNORMAL LOW (ref >60)
GLUCOSE: 85 mg/dL (ref 65–99)
POTASSIUM: 4.4 mmol/L (ref 3.5–5.1)
Sodium: 135 mmol/L (ref 135–145)
Total Protein: 7.5 g/dL (ref 6.5–8.1)

## 2017-12-06 LAB — GLUCOSE, CAPILLARY
Glucose-Capillary: 143 mg/dL — ABNORMAL HIGH (ref 65–99)
Glucose-Capillary: 100 mg/dL — ABNORMAL HIGH (ref 65–99)
Glucose-Capillary: 87 mg/dL (ref 65–99)
Glucose-Capillary: 117 mg/dL — ABNORMAL HIGH (ref 65–99)

## 2017-12-06 LAB — PROTIME-INR
INR: 2.35
PROTHROMBIN TIME: 25.5 s — AB (ref 11.4–15.2)

## 2017-12-06 MED ORDER — WARFARIN SODIUM 2.5 MG PO TABS
2.5000 mg | ORAL_TABLET | Freq: Once | ORAL | Status: AC
  Administered 2017-12-06: 2.5 mg via ORAL
  Filled 2017-12-06: qty 1

## 2017-12-06 MED ORDER — DIGOXIN 125 MCG PO TABS
0.0625 mg | ORAL_TABLET | Freq: Every day | ORAL | Status: DC
  Administered 2017-12-06: 0.0625 mg via ORAL
  Filled 2017-12-06: qty 1

## 2017-12-06 MED ORDER — DIGOXIN 125 MCG PO TABS
0.0625 mg | ORAL_TABLET | ORAL | Status: DC
Start: 2017-12-08 — End: 2017-12-14
  Administered 2017-12-08 – 2017-12-14 (×3): 0.0625 mg via ORAL
  Filled 2017-12-06 (×3): qty 1

## 2017-12-06 NOTE — Progress Notes (Signed)
Hospitalist progress note   Michael [name]  HUT:[id number] DOB: 03-30-1961 DOA: 11/30/2017 PCP: [name], MD   Specialists:  Nephro  Brief Narrative:  27 M, ESRD MWF S-c/b hypotension on midodrine, chronic pain, afib chads score ~4 on warfarin, hepatic cirrhosis + encephalopathy chronic thrombocytopenia, CAD s/p CABG+right-sided heart failure-status post bioprosthetic MVR + tricuspid repair 2012 [place] [facility], HLD, chronic respiratory failure, type 2 diabetes mellitus previous-colonoscopy 07/16/2017 sessile polyps status post removal/Int hemorrhoids, prior gout PEA arrest 07/2017 status post CPR-at that admission left heart cath showing chronically occluded distal RDA collateralized with no intervention needed  Recent admission 1/7-1/10 with hepatic encephalopathy and placed on lactulose at the time Returns to hospital 1/15 shortness of breath-febrile 38.9 tachycardic 110 CXR?  Edema flu negative, lactic acid 2.6 troponins normal admitted with Sirs suspected sepsis    Assessment & Plan:   Assessment:  The primary encounter diagnosis was Tachycardia. Diagnoses of Precordial pain, Fever, unspecified fever cause, Cough, Shortness of breath, PNA (pneumonia), ESRD (end stage renal disease) ([place]), Hypervolemia, unspecified hypervolemia type, and Insulin dependent diabetes mellitus ([place]) were also pertinent to this visit.  Sepsis-coronovirus +, unlikely source-catheter left chest -de-escalated vanc/ceftri ->off.  BC X2 1/15 NG TD-lactic acid 2.6-2.5.  transfer to tele .  Adding hycodan for cough suppression 1/17--as coughing wil rpt cxr today as had some fluid build up on priro cxr  ESRD hypovolemia MWFS-continue PhosLo 3 times daily-appreciate input Consider lowering of EDW  hypotensive 1/16 probably related to sepsis secondary to coronavirus + constitutional state-250 cc IV fluid bolus given 1/16, extra dose Midodrine-blood pressures have improved marginally on 1/17-see below Re Cardiac  issues  Atrial fibrillation control complicated by Hypotension , mitral valve repair in 2012, CABG status post cath 07/2017 with PEA arrest, chads score >4- continue Coumadin-metoprolol restarted 12.5 xl-monitor rates as elevated 120 this am--monitor bp as well- will add low dose Digoxin 0.125-Amiodarone relatively contraindicated given liver pathology-cannot titrate BB and with h/o CAD not best candidate for Cardizem--dig 0.0625 qod and monitor satsand hr with ambulation-nearing home if no further issues  Anemia renal disease-hemoglobin 10 but micocytic-OP colonoscopy and work-up Iron/ESa as per Nephro  Chronic pain continue OxyIR as needed Voltaren  Chronic respiratory failure on oxygen-wean as tolerated--looks improved overall  Hyperlipidemia-continue Lipitor 20 daily  Liver cirrhosis idiopathic + thrombocytopenia-continue lactulose 30 ml twice daily, hydroxyzine 20 5/2-1 as needed itching 3 times daily--TCP has improved likely2/2 recovery viral illness  Reflux, prior colonic polyps-continue pantoprazole 40 daily  Gout continue 150 mg allopurinol daily, colchicine 0.5 twice  Weekly  DM ty 11 not on meds-renal card diet-SSI   DVT prophylaxis: lovenox  Code Status:   full   Family Communication: no family Disposition Plan:  inpatient   Consultants:   Nephrology  Procedures:   ?  Antimicrobials:   Vancomycin 1/15-1/16  ceftriaxone 1/15-1/16  Subjective:  Coughing more today-some sputum No cp Overall improved No fever no chills    Objective: Vitals:   12/06/17 0800 12/06/17 0900 12/06/17 1000 12/06/17 1100  BP: 117/74 (!) 122/92 (!) 117/94 123/90  Pulse: 84 (!) 109 (!) 113 (!) 134  Resp: (!) 23 (!) 25 16 20   Temp:  (!) 97.4 F (36.3 C)    TempSrc:  Oral    SpO2: 98% 98% 99% 100%  Weight:      Height:        Intake/Output Summary (Last 24 hours) at 12/06/2017 1236 Last data filed at 12/05/2017 2122 Gross per 24 hour  Intake  483 ml  Output -  Net 483 ml    Filed Weights   12/05/17 0735 12/05/17 1212 12/06/17 0500  Weight: 63.4 kg (139 lb 12.4 oz) 60.4 kg (133 lb 2.5 oz) 62.3 kg (137 lb 5.6 oz)    Examination:  Awake alert in nad No distress Some cough-chest slightly more dull r post No cp no n/v abd soft nt nd  No le edema    Data Reviewed: I have personally reviewed following labs and imaging studies  CBC: Recent Labs  Lab 12/01/17 0509 12/02/17 0429 12/03/17 0722 12/04/17 0450 12/05/17 0657 12/06/17 0343  WBC 7.4 7.5 6.0 5.8 5.9 6.6  NEUTROABS 4.2  --   --  2.5  --  3.3  HGB 10.3* 9.7* 9.9* 10.1* 12.4* 10.6*  HCT 30.8* 28.5* 29.4* 30.0* 35.4* 31.5*  MCV 77.0* 76.0* 76.0* 76.3* 76.8* 77.8*  PLT 56* 67* 101* 122* 116* 258*   Basic Metabolic Panel: Recent Labs  Lab 12/01/17 0509 12/02/17 0429 12/04/17 0450 12/05/17 0657 12/06/17 0343  NA 133* 129* 135 131* 135  K 4.1 5.0 3.9 4.6 4.4  CL 97* 95* 99* 97* 99*  CO2 25 22 28 23 27   GLUCOSE 124* 111* 100* 95 85  BUN 23* 37* 8 6 7   CREATININE 2.97* 3.67* 2.13* 1.90* 2.15*  CALCIUM 8.4* 8.7* 8.5* 8.3* 8.6*  PHOS  --  2.9  --  1.8*  --    GFR: Estimated Creatinine Clearance: 32.6 mL/min (A) (by C-G formula based on SCr of 2.15 mg/dL (H)). Liver Function Tests: Recent Labs  Lab 11/30/17 1731 12/02/17 0429 12/04/17 0450 12/05/17 0657 12/06/17 0343  AST 48*  --  27  --  27  ALT 16*  --  13*  --  13*  ALKPHOS 184*  --  166*  --  174*  BILITOT 2.5*  --  1.9*  --  2.0*  PROT 8.0  --  7.4  --  7.5  ALBUMIN 3.1* 2.6* 2.6* 2.8* 2.6*   Recent Labs  Lab 11/30/17 1731  LIPASE 69*   Recent Labs  Lab 11/30/17 1731  AMMONIA 50*   Coagulation Profile: Recent Labs  Lab 12/02/17 0429 12/03/17 0722 12/04/17 0450 12/05/17 0657 12/06/17 0343  INR 1.61 2.10 2.35 2.15 2.35   Cardiac Enzymes: No results for input(s): CKTOTAL, CKMB, CKMBINDEX, TROPONINI in the last 168 hours. CBG: Recent Labs  Lab 12/05/17 1301 12/05/17 1633 12/05/17 2126 12/06/17 0820  12/06/17 1139  GLUCAP 108* 98 105* 143* 100*   Urine analysis:    Component Value Date/Time   COLORURINE AMBER (A) 07/25/2017 1806   APPEARANCEUR HAZY (A) 07/25/2017 1806   LABSPEC 1.018 07/25/2017 1806   PHURINE 5.0 07/25/2017 1806   GLUCOSEU NEGATIVE 07/25/2017 1806   HGBUR MODERATE (A) 07/25/2017 1806   BILIRUBINUR NEGATIVE 07/25/2017 1806   KETONESUR NEGATIVE 07/25/2017 1806   PROTEINUR 100 (A) 07/25/2017 1806   NITRITE NEGATIVE 07/25/2017 1806   LEUKOCYTESUR NEGATIVE 07/25/2017 1806     Radiology Studies: Reviewed images personally in health database    Scheduled Meds: . allopurinol  150 mg Oral Daily  . atorvastatin  20 mg Oral Daily  . benzonatate  200 mg Oral TID  . calcium acetate  2,001 mg Oral TID WC  . [START ON 12/10/2017] darbepoetin (ARANESP) injection - DIALYSIS  100 mcg Intravenous Q Thu-HD  . digoxin  0.0625 mg Oral Daily  . docusate sodium  100 mg Oral BID  . insulin aspart  0-9  Units Subcutaneous TID WC  . metoprolol succinate  12.5 mg Oral BID  . midodrine  10 mg Oral TID WC  . multivitamin  1 tablet Oral QHS  . pantoprazole  40 mg Oral Daily  . sodium chloride flush  3 mL Intravenous Q12H  . sodium chloride flush  3 mL Intravenous Q12H  . warfarin  2.5 mg Oral ONCE-1800  . Warfarin - Pharmacist Dosing Inpatient   Does not apply q1800   Continuous Infusions: . sodium chloride       LOS: 5 days    Time spent: [place], MD [facility] ([facility]   If 7PM-7AM, please contact night-coverage [email or web address] Password [facility] 12/06/2017, 12:36 PM

## 2017-12-06 NOTE — Progress Notes (Signed)
Subjective: Interval History: has complaints wants to go home.  Objective: Vital signs in last 24 hours: Temp:  [97.9 F (36.6 C)-99.1 F (37.3 C)] 98.4 F (36.9 C) (01/20 0406) Pulse Rate:  [34-140] 109 (01/20 0900) Resp:  [16-25] 25 (01/20 0900) BP: (101-122)/(40-99) 122/92 (01/20 0900) SpO2:  [98 %-100 %] 98 % (01/20 0900) Weight:  [60.4 kg (133 lb 2.5 oz)-62.3 kg (137 lb 5.6 oz)] 62.3 kg (137 lb 5.6 oz) (01/20 0500) Weight change: -3.1 kg (-13.3 oz)  Intake/Output from previous day: 01/19 0701 - 01/20 0700 In: 483 [P.O.:480; I.V.:3] Out: 3001  Intake/Output this shift: No intake/output data recorded.  General appearance: alert, cooperative and no distress Resp: rales bibasilar Chest wall: IJ cath Cardio: irregularly irregular rhythm and systolic murmur: holosystolic 2/6, blowing at apex GI: liver down 4 cm Extremities: new AVF LUA  Lab Results: Recent Labs    12/05/17 0657 12/06/17 0343  WBC 5.9 6.6  HGB 12.4* 10.6*  HCT 35.4* 31.5*  PLT 116* 132*   BMET:  Recent Labs    12/05/17 0657 12/06/17 0343  NA 131* 135  K 4.6 4.4  CL 97* 99*  CO2 23 27  GLUCOSE 95 85  BUN 6 7  CREATININE 1.90* 2.15*  CALCIUM 8.3* 8.6*   No results for input(s): PTH in the last 72 hours. Iron Studies: No results for input(s): IRON, TIBC, TRANSFERRIN, FERRITIN in the last 72 hours.  Studies/Results: No results found.  I have reviewed the patient's current medications.  Assessment/Plan: 1 ESRD HD MWFS  Hd tomorrow. Cont to lower vol 2 Afib rate better anticoag 3 Anemia varying Hb, follow 4 HPTH vit D 5 DM controlled 6 MVR/TVR/Maze P HD, esa, lower vol, control rate, bs    LOS: 5 days   Jeneen Rinks Kail Fraley 12/06/2017,9:20 AM

## 2017-12-06 NOTE — Progress Notes (Signed)
ANTICOAGULATION CONSULT NOTE - Follow Up Consult  Pharmacy Consult for warfarin Indication: atrial fibrillation  No Known Allergies  Patient Measurements: Height: 5\' 5"  (165.1 cm) Weight: 137 lb 5.6 oz (62.3 kg) IBW/kg (Calculated) : 61.5  Vital Signs: Temp: 98.4 F (36.9 C) (01/20 0406) Temp Source: Oral (01/20 0406) BP: 118/84 (01/20 0406) Pulse Rate: 103 (01/20 0406)  Labs: Recent Labs    12/04/17 0450 12/05/17 0657 12/06/17 0343  HGB 10.1* 12.4* 10.6*  HCT 30.0* 35.4* 31.5*  PLT 122* 116* 132*  LABPROT 25.5* 23.9* 25.5*  INR 2.35 2.15 2.35  CREATININE 2.13* 1.90* 2.15*    Estimated Creatinine Clearance: 32.6 mL/min (A) (by C-G formula based on SCr of 2.15 mg/dL (H)).   Medications:  Scheduled:  . allopurinol  150 mg Oral Daily  . atorvastatin  20 mg Oral Daily  . benzonatate  200 mg Oral TID  . calcium acetate  2,001 mg Oral TID WC  . [START ON 12/10/2017] darbepoetin (ARANESP) injection - DIALYSIS  100 mcg Intravenous Q Thu-HD  . docusate sodium  100 mg Oral BID  . insulin aspart  0-9 Units Subcutaneous TID WC  . metoprolol succinate  12.5 mg Oral BID  . midodrine  10 mg Oral TID WC  . multivitamin  1 tablet Oral QHS  . pantoprazole  40 mg Oral Daily  . sodium chloride flush  3 mL Intravenous Q12H  . sodium chloride flush  3 mL Intravenous Q12H  . Warfarin - Pharmacist Dosing Inpatient   Does not apply q1800   Infusions:  . sodium chloride      Assessment: 57 y/o male with ESRD on HD on warfarin PTA for afib, admitted with chest pain, SOB. Of note patient was recently discharged from hospital 1/10 for similar symptoms, and had not taken warfarin at home since discharge. INR on admit 1.9, now back in range using PTA regimen. INR today 2.35, up from yesterday. CBC low, stable. No bleeding noted.  PTA regimen: warfarin 2.5 mg daily per HD clinic notes. Per anticoag clinic notes dose was increased to 3.75 mg on Wed/Sat and 2.5 mg all other days.  Goal of  Therapy:  INR 2-3 Monitor platelets by anticoagulation protocol: Yes   Plan:  Warfarin 2.5 mg PO x 1 Daily INR, CBC Monitor s/sx of bleeding   Charlene Brooke, PharmD PGY1 Pharmacy Resident Phone: [phone] After 3:30PM please call Main Pharmacy #[id number]= 12/06/2017,7:41 AM

## 2017-12-07 ENCOUNTER — Inpatient Hospital Stay (HOSPITAL_COMMUNITY): Payer: Medicare Other

## 2017-12-07 DIAGNOSIS — R6510 Systemic inflammatory response syndrome (SIRS) of non-infectious origin without acute organ dysfunction: Secondary | ICD-10-CM

## 2017-12-07 LAB — RENAL FUNCTION PANEL
ALBUMIN: 2.4 g/dL — AB (ref 3.5–5.0)
Anion gap: 8 (ref 5–15)
BUN: 16 mg/dL (ref 6–20)
CHLORIDE: 98 mmol/L — AB (ref 101–111)
CO2: 27 mmol/L (ref 22–32)
Calcium: 8.5 mg/dL — ABNORMAL LOW (ref 8.9–10.3)
Creatinine, Ser: 3.18 mg/dL — ABNORMAL HIGH (ref 0.61–1.24)
GFR calc Af Amer: 23 mL/min — ABNORMAL LOW (ref >60)
GFR calc non Af Amer: 20 mL/min — ABNORMAL LOW (ref >60)
GLUCOSE: 76 mg/dL (ref 65–99)
PHOSPHORUS: 2.3 mg/dL — AB (ref 2.5–4.6)
POTASSIUM: 4.5 mmol/L (ref 3.5–5.1)
Sodium: 133 mmol/L — ABNORMAL LOW (ref 135–145)

## 2017-12-07 LAB — CBC
HEMATOCRIT: 31 % — AB (ref 39.0–52.0)
Hemoglobin: 10.6 g/dL — ABNORMAL LOW (ref 13.0–17.0)
MCH: 26.4 pg (ref 26.0–34.0)
MCHC: 34.2 g/dL (ref 30.0–36.0)
MCV: 77.1 fL — AB (ref 78.0–100.0)
Platelets: 189 10*3/uL (ref 150–400)
RBC: 4.02 MIL/uL — ABNORMAL LOW (ref 4.22–5.81)
RDW: 17.3 % — AB (ref 11.5–15.5)
WBC: 7 10*3/uL (ref 4.0–10.5)

## 2017-12-07 LAB — GLUCOSE, CAPILLARY
GLUCOSE-CAPILLARY: 81 mg/dL (ref 65–99)
GLUCOSE-CAPILLARY: 78 mg/dL (ref 65–99)
Glucose-Capillary: 132 mg/dL — ABNORMAL HIGH (ref 65–99)
Glucose-Capillary: 107 mg/dL — ABNORMAL HIGH (ref 65–99)

## 2017-12-07 LAB — PROTIME-INR
INR: 2.94
PROTHROMBIN TIME: 30.4 s — AB (ref 11.4–15.2)

## 2017-12-07 MED ORDER — LIDOCAINE-PRILOCAINE 2.5-2.5 % EX CREA: 1.0000 "application " | TOPICAL_CREAM | CUTANEOUS | Status: DC | PRN

## 2017-12-07 MED ORDER — DARBEPOETIN ALFA 100 MCG/0.5ML IJ SOSY
100.0000 ug | PREFILLED_SYRINGE | INTRAMUSCULAR | Status: DC
  Filled 2017-12-07: qty 0.5

## 2017-12-07 MED ORDER — SODIUM CHLORIDE 0.9 % IV BOLUS (SEPSIS)
250.0000 mL | Freq: Once | INTRAVENOUS | Status: AC
  Administered 2017-12-08: 250 mL via INTRAVENOUS

## 2017-12-07 MED ORDER — HEPARIN SODIUM (PORCINE) 1000 UNIT/ML DIALYSIS
100.0000 [IU]/kg | INTRAMUSCULAR | Status: DC | PRN
  Administered 2017-12-07: 6200 [IU] via INTRAVENOUS_CENTRAL
  Filled 2017-12-07 (×2): qty 7

## 2017-12-07 MED ORDER — SODIUM CHLORIDE 0.9 % IV SOLN: 100.0000 mL | INTRAVENOUS | Status: DC | PRN

## 2017-12-07 MED ORDER — HEPARIN SODIUM (PORCINE) 1000 UNIT/ML DIALYSIS: 1000.0000 [IU] | INTRAMUSCULAR | Status: DC | PRN

## 2017-12-07 MED ORDER — PENTAFLUOROPROP-TETRAFLUOROETH EX AERO
1.0000 | INHALATION_SPRAY | CUTANEOUS | Status: DC | PRN
Start: 2017-12-07 — End: 2017-12-07

## 2017-12-07 MED ORDER — LIDOCAINE HCL (PF) 1 % IJ SOLN: 5.0000 mL | INTRAMUSCULAR | Status: DC | PRN

## 2017-12-07 MED ORDER — WARFARIN SODIUM 2 MG PO TABS
2.0000 mg | ORAL_TABLET | Freq: Once | ORAL | Status: AC
  Administered 2017-12-07: 2 mg via ORAL
  Filled 2017-12-07 (×2): qty 1

## 2017-12-07 MED ORDER — MIDODRINE HCL 5 MG PO TABS
ORAL_TABLET | ORAL | Status: AC
  Filled 2017-12-07: qty 2

## 2017-12-07 MED ORDER — ALTEPLASE 2 MG IJ SOLR: 2.0000 mg | Freq: Once | INTRAMUSCULAR | Status: DC | PRN

## 2017-12-07 MED ORDER — HYDROCORTISONE 1 % EX CREA
1.0000 "application " | TOPICAL_CREAM | Freq: Three times a day (TID) | CUTANEOUS | Status: DC | PRN
  Administered 2017-12-07 – 2017-12-11 (×3): 1 via TOPICAL
  Filled 2017-12-07: qty 28

## 2017-12-07 NOTE — Care Management Note (Addendum)
Case Management Note  Patient Details  Name: Michael [name] MRN: [id number] Date of Birth: [birth year]  Subjective/Objective:  From home, with hx of ESRD and gout , Diaylsis on MWF, presents with sepsis, hypotensive, aifb, chronic pain, chronic resp failure, HLD, liver cirrhosis.  Patient is back from HD, today, he is very grogy,  He states he has a walker at home.  He is agreeable to have [facility] follow him in the Community.  He is also established with [facility].  Jane with [facility] at [facility] clinic will schedule an apt for follow up for patient , when we get closer to a dc date.  HRI with [facility].     1/23 [place] [facility]- Patient conts with pl effusion, he is on coumadin, MD does not want to do thoracentesis because will need to reverse the coumadin, will try to get fluid off thru HD.  Patient had HD this am , then had cxr.   NCM spoke to patient about [place] with [facility] when he goes home and also following up with [facility] and that he is established with [facility] clinic, which he will need a follow up apt scheduled before dc.             Action/Plan: NCM will cont to follow for dc needs.  Expected Discharge Date:  12/05/17               Expected Discharge Plan:     In-House Referral:     Discharge planning Services  CM Consult  Post Acute Care Choice:    Choice offered to:     DME Arranged:    DME Agency:     HH Arranged:    HH Agency:     Status of Service:  In process, will continue to follow  If discussed at Long Length of Stay Meetings, dates discussed:    Additional Comments:  [name], RN 12/07/2017, 2:16 PM

## 2017-12-07 NOTE — Progress Notes (Signed)
Patient went to CT

## 2017-12-07 NOTE — Progress Notes (Signed)
IV team at bedside:  Area was redressed, Concerned that area could have allergic reaction or infected.    Bodenheimer, NP notified.

## 2017-12-07 NOTE — Progress Notes (Signed)
Subjective: Interval History: c/o itching around HD cath  Objective: Vital signs in last 24 hours: Temp:  [97.6 F (36.4 C)-98.4 F (36.9 C)] 97.6 F (36.4 C) (01/21 0735) Pulse Rate:  [59-160] 90 (01/21 1130) Resp:  [11-25] 15 (01/21 1130) BP: (83-188)/(52-177) 98/63 (01/21 1130) SpO2:  [94 %-100 %] 99 % (01/21 1130) Weight:  [62.1 kg (136 lb 14.5 oz)-62.4 kg (137 lb 9.1 oz)] 62.1 kg (136 lb 14.5 oz) (01/21 0735) Weight change: -1 kg (-3.3 oz)  Intake/Output from previous day: 01/20 0701 - 01/21 0700 In: 1468 [P.O.:1468] Out: -  Intake/Output this shift: No intake/output data recorded.  General appearance: alert, cooperative and no distress Resp: rales bibasilar Chest wall: IJ cath, reddened area 4x 4 in surrounding TDC, no pus Cardio: irregularly irregular rhythm and systolic murmur: holosystolic 2/6, blowing at apex GI: liver down 4 cm Extremities: new AVF [facility]  Lab Results: Recent Labs    12/06/17 0343 12/07/17 0753  WBC 6.6 7.0  HGB 10.6* 10.6*  HCT 31.5* 31.0*  PLT 132* 189   BMET:  Recent Labs    12/06/17 0343 12/07/17 0753  NA 135 133*  K 4.4 4.5  CL 99* 98*  CO2 27 27  GLUCOSE 85 76  BUN 7 16  CREATININE 2.15* 3.18*  CALCIUM 8.6* 8.5*   No results for input(s): PTH in the last 72 hours. Iron Studies: No results for input(s): IRON, TIBC, TRANSFERRIN, FERRITIN in the last 72 hours.  Studies/Results: Dg Chest Port 1 View  Result Date: 12/06/2017 CLINICAL DATA:  57 year old male with history of pneumonia. EXAM: PORTABLE CHEST 1 VIEW COMPARISON:  Chest x-ray 12/03/2017. FINDINGS: Right internal jugular Port-A-Cath with tip terminating at the superior cavoatrial junction. Status post median sternal. Large right pleural effusion with atelectasis and/or consolidation in the base of the right lung. Left lung is clear. Small left pleural effusion. No evidence of pulmonary edema. Cardiomegaly. The patient is rotated to the left on today's exam,  resulting in distortion of the mediastinal contours and reduced diagnostic sensitivity and specificity for mediastinal pathology. Aortic atherosclerosis. IMPRESSION: Enlarging large right pleural effusion with worsening atelectasis and/or consolidation in the base of the right hemithorax. The possibility of an empyema should be considered. Electronically Signed   By: Vinnie Langton M.D.   On: 12/06/2017 13:24    I have reviewed the patient's current medications.  MWF [facility] 4h  300/800  68.5kg   3K/2.25 bath  TDC R / LUA BVT+bruit (2nd stage 11/03/17 Dr Donzetta Matters)  Hep none - Mircera 64mcg IV q 2 weeks (last given 11/18/17) - No VDRA    Assessment: 1 ESRD HD MWFS  Hd today. ? AVF ready. Cont to lower vol, midodrine 2 Afib rate better anticoag 3 Anemia varying Hb, follow 4 HPTH vit D 5 DM controlled 6 MVR/TVR/Maze 7 SIRS +coronavirus URI  P HD, esa, lower vol, control rate, bs    LOS: 6 days   Sol Blazing 12/07/2017,12:01 PM

## 2017-12-07 NOTE — Progress Notes (Signed)
Pt. picked up for HD during shift report.

## 2017-12-07 NOTE — Progress Notes (Signed)
PT Cancellation Note  Patient Details Name: Michael [name] MRN: [id number] DOB: 1961-01-31   Cancelled Treatment:    Reason Eval/Treat Not Completed: Patient at procedure or test/unavailable(Pt in HD.  Will check back later today.  Thanks. )   Godfrey Pick Lamarius Dirr 12/07/2017, 8:56 AM  [facility] [phone] (pager)

## 2017-12-07 NOTE — Progress Notes (Addendum)
Went into patient room: pt informed this RN that he removed his sterile dressing himself because it was itching and redressed it with gauze and taped it back himself but said that it felt like the tip was coming out.  Bodenheimer,NP notified  IV team paged to come and  come and asses  HD cath

## 2017-12-07 NOTE — Care Management Important Message (Signed)
Important Message  Patient Details  Name: Michael [name] MRN: [id number] Date of Birth: November 29, 1960   Medicare Important Message Given:  Yes    [name], RN 12/07/2017, 3:07 PM

## 2017-12-07 NOTE — Progress Notes (Signed)
R [place] insertion site wnl, but area around site noted to be red/inflamed. Pt c/o area around site itching and is scratching at site. Pt instructed to avoid scratching area. Site care & dsg chg'd. Bedside RN to notify MD to evaluate. Randol Kern, RN IV Team

## 2017-12-07 NOTE — Progress Notes (Signed)
Physical Therapy Treatment Patient Details Name: Michael [name] MRN: [id number] DOB: [birth year] Today's Date: 12/07/2017    History of Present Illness Michael [name] is a 57 y.o. male with medical history significant for end-stage renal disease, chronic pain, atrial fibrillation on warfarin, and chronic thrombocytopenia, now presenting to the emergency department from his dialysis center for evaluation of chest pain and shortness of breath.    PT Comments    Pt admitted with above diagnosis. Pt currently with functional limitations due to balance and endurance deficits. Pt was able to ambulate on unit with min guard assist  With RW.  Incr distance today.  Good balance overall with RW.  Will continue PT. Pt will benefit from skilled PT to increase their independence and safety with mobility to allow discharge to the venue listed below.     Follow Up Recommendations  No PT follow up     Equipment Recommendations  Rolling walker with 5" wheels    Recommendations for Other Services       Precautions / Restrictions Precautions Precautions: Fall Restrictions Weight Bearing Restrictions: No    Mobility  Bed Mobility Overal bed mobility: Needs Assistance Bed Mobility: Supine to Sit     Supine to sit: Supervision     General bed mobility comments: for lines, etc  Transfers Overall transfer level: Needs assistance Equipment used: Rolling walker (2 wheeled) Transfers: Sit to/from Stand Sit to Stand: Supervision         General transfer comment: assist for safety/lines/balance  Ambulation/Gait Ambulation/Gait assistance: Min guard Ambulation Distance (Feet): 280 Feet Assistive device: Rolling walker (2 wheeled) Gait Pattern/deviations: Step-through pattern;Decreased stride length   Gait velocity interpretation: Below normal speed for age/gender General Gait Details: No LOB in controlled environment.  Pt safe with RW. once back to room, pt wanted to walk without  RW to bathroom with pt furniture walking as he feels he must at least have 1 UE support.    Stairs            Wheelchair Mobility    Modified Rankin (Stroke Patients Only)       Balance Overall balance assessment: Needs assistance Sitting-balance support: No upper extremity supported;Feet supported Sitting balance-Leahy Scale: Good     Standing balance support: Bilateral upper extremity supported;During functional activity Standing balance-Leahy Scale: Fair Standing balance comment: can stand statically without support but relies on UEs for dynamic support.                             Cognition Arousal/Alertness: Awake/alert Behavior During Therapy: Flat affect Overall Cognitive Status: No family/caregiver present to determine baseline cognitive functioning                                        Exercises      General Comments        Pertinent Vitals/Pain  No pain.  VSS.     Home Living                      Prior Function            PT Goals (current goals can now be found in the care plan section) Acute Rehab PT Goals Patient Stated Goal: to get stronger Progress towards PT goals: Progressing toward goals    Frequency    Min 3X/week  PT Plan Current plan remains appropriate    Co-evaluation              AM-PAC PT "6 Clicks" Daily Activity  Outcome Measure  Difficulty turning over in bed (including adjusting bedclothes, sheets and blankets)?: A Little Difficulty moving from lying on back to sitting on the side of the bed? : A Little Difficulty sitting down on and standing up from a chair with arms (e.g., wheelchair, bedside commode, etc,.)?: A Little Help needed moving to and from a bed to chair (including a wheelchair)?: A Little Help needed walking in hospital room?: A Little Help needed climbing 3-5 steps with a railing? : A Little 6 Click Score: 18    End of Session Equipment Utilized  During Treatment: Gait belt Activity Tolerance: Patient tolerated treatment well Patient left: with call bell/phone within reach;in bed Nurse Communication: Mobility status PT Visit Diagnosis: Muscle weakness (generalized) (M62.81)     Time: 4175-3010 PT Time Calculation (min) (ACUTE ONLY): 16 min  Charges:  $Gait Training: 8-22 mins                    G Codes:       [place],PT Acute Rehabilitation [phone] (pager)    Denice Paradise 12/07/2017, 4:05 PM

## 2017-12-07 NOTE — Progress Notes (Signed)
Hospitalist progress note   Michael [name]  KWI:[id number] DOB: 1960/12/07 DOA: 11/30/2017 PCP: [name], MD   Specialists:  Nephro  Brief Narrative:   9 M, ESRD MWF S-c/b hypotension on midodrine, chronic pain, afib chads score ~4 on warfarin, hepatic cirrhosis + encephalopathy chronic thrombocytopenia, CAD s/p CABG+right-sided heart failure-status post bioprosthetic MVR + tricuspid repair 2012 [place] CLT, HLD, chronic respiratory failure, type 2 diabetes mellitus previous-colonoscopy 07/16/2017 sessile polyps status post removal/Int hemorrhoids, prior gout PEA arrest 07/2017 status post CPR-at that admission left heart cath showing chronically occluded distal RDA collateralized with no intervention needed  Recent admission 1/7-1/10 with hepatic encephalopathy and placed on lactulose at the time Returns to hospital 1/15 shortness of breath-febrile 38.9 tachycardic 110 CXR?  Edema flu negative, lactic acid 2.6 troponins normal admitted with Sirs suspected sepsis    Assessment & Plan:   Assessment:  The primary encounter diagnosis was Tachycardia. Diagnoses of Precordial pain, Fever, unspecified fever cause, Cough, Shortness of breath, PNA (pneumonia), ESRD (end stage renal disease) ([place]), Hypervolemia, unspecified hypervolemia type, and Insulin dependent diabetes mellitus ([place]) were also pertinent to this visit.  Sepsis-coronovirus +, unlikely source-catheter left chest -de-escalated vanc/ceftri ->off.  BC X2 1/15 NG TD-lactic acid 2.6-2.5.  transfer to tele .  Adding hycodan for cough suppression 1/17--as coughing wil rpt cxr today as had some fluid build up on prior cxr  Pleural effusion-Ct scan 1/21 confirms free-flowing effusion-spoke with CCM who will see and eval for thoracentesis-appreciate input  ESRD hypovolemia MWFS-continue PhosLo 3 times daily-appreciate input Consider lowering of EDW  hypotensive 1/16 probably related to sepsis secondary to coronavirus + constitutional  state-250 cc IV fluid bolus given 1/16, extra dose Midodrine-blood pressures have improved marginally on 1/17-see below Re Cardiac issues  Atrial fibrillation control complicated by Hypotension , mitral valve repair in 2012, CABG status post cath 07/2017 with PEA arrest, chads score >4- continue Coumadin-metoprolol restarted 12.5 xl- Now on Digoxin 0.125-Amiodarone relatively contraindicated--cannot titrate BB and with h/o CAD not best candidate for Cardizem--dig 0.0625 qod and monitor sats and hr with ambulation.  Anemia renal disease-hemoglobin 10 but micocytic-OP colonoscopy and work-up Iron/ESa as per Nephro  Chronic pain continue OxyIR as needed Voltaren  Chronic respiratory failure on oxygen-wean as tolerated--looks improved overall  Hyperlipidemia-continue Lipitor 20 daily  Liver cirrhosis idiopathic + thrombocytopenia-continue lactulose 30 ml twice daily, hydroxyzine 20 5/2-1 as needed itching 3 times daily--TCP has improved likely 2/2 recovery viral illness  Reflux, prior colonic polyps-continue pantoprazole 40 daily  Gout continue 150 mg allopurinol daily, colchicine 0.5 twice  Weekly  DM tyii  not on meds-renal card diet-SSI   DVT prophylaxis: lovenox  Code Status:   full   Family Communication: no family Disposition Plan:  inpatient   Consultants:   Nephrology  Procedures:   ?  Antimicrobials:   Vancomycin 1/15-1/16  ceftriaxone 1/15-1/16  Subjective:  Looks fair cough minimally improved no cp no fever no chills no rigor Some sputum Back form HD asking about home   Objective: Vitals:   12/07/17 1238 12/07/17 1242 12/07/17 1300 12/07/17 1326  BP:  92/62 96/65 102/71  Pulse:  94 89 86  Resp:  (!) 22 19   Temp:  97.7 F (36.5 C)    TempSrc: Oral Oral    SpO2:  96% 93%   Weight:      Height:        Intake/Output Summary (Last 24 hours) at 12/07/2017 1543 Last data filed at 12/07/2017 1331 Gross per 24  hour  Intake 1474 ml  Output 2588 ml  Net  -1114 ml   Filed Weights   12/07/17 0400 12/07/17 0735 12/07/17 1142  Weight: 62.4 kg (137 lb 9.1 oz) 62.1 kg (136 lb 14.5 oz) 59.5 kg (131 lb 2.8 oz)    Examination:  Awake alert in nad No distress Some cough-chest dull r post No cp no n/v abd soft nt nd  No le edema    Data Reviewed: I have personally reviewed following labs and imaging studies  CBC: Recent Labs  Lab 12/01/17 0509  12/03/17 0722 12/04/17 0450 12/05/17 0657 12/06/17 0343 12/07/17 0753  WBC 7.4   < > 6.0 5.8 5.9 6.6 7.0  NEUTROABS 4.2  --   --  2.5  --  3.3  --   HGB 10.3*   < > 9.9* 10.1* 12.4* 10.6* 10.6*  HCT 30.8*   < > 29.4* 30.0* 35.4* 31.5* 31.0*  MCV 77.0*   < > 76.0* 76.3* 76.8* 77.8* 77.1*  PLT 56*   < > 101* 122* 116* 132* 189   < > = values in this interval not displayed.   Basic Metabolic Panel: Recent Labs  Lab 12/02/17 0429 12/04/17 0450 12/05/17 0657 12/06/17 0343 12/07/17 0753  NA 129* 135 131* 135 133*  K 5.0 3.9 4.6 4.4 4.5  CL 95* 99* 97* 99* 98*  CO2 22 28 23 27 27   GLUCOSE 111* 100* 95 85 76  BUN 37* 8 6 7 16   CREATININE 3.67* 2.13* 1.90* 2.15* 3.18*  CALCIUM 8.7* 8.5* 8.3* 8.6* 8.5*  PHOS 2.9  --  1.8*  --  2.3*   GFR: Estimated Creatinine Clearance: 21.3 mL/min (A) (by C-G formula based on SCr of 3.18 mg/dL (H)). Liver Function Tests: Recent Labs  Lab 11/30/17 1731 12/02/17 0429 12/04/17 0450 12/05/17 0657 12/06/17 0343 12/07/17 0753  AST 48*  --  27  --  27  --   ALT 16*  --  13*  --  13*  --   ALKPHOS 184*  --  166*  --  174*  --   BILITOT 2.5*  --  1.9*  --  2.0*  --   PROT 8.0  --  7.4  --  7.5  --   ALBUMIN 3.1* 2.6* 2.6* 2.8* 2.6* 2.4*   Recent Labs  Lab 11/30/17 1731  LIPASE 69*   Recent Labs  Lab 11/30/17 1731  AMMONIA 50*   Coagulation Profile: Recent Labs  Lab 12/03/17 0722 12/04/17 0450 12/05/17 0657 12/06/17 0343 12/07/17 0351  INR 2.10 2.35 2.15 2.35 2.94   Cardiac Enzymes: No results for input(s): CKTOTAL, CKMB,  CKMBINDEX, TROPONINI in the last 168 hours. CBG: Recent Labs  Lab 12/06/17 0820 12/06/17 1139 12/06/17 1708 12/06/17 2000 12/07/17 1243  GLUCAP 143* 100* 87 117* 132*   Urine analysis:    Component Value Date/Time   COLORURINE AMBER (A) 07/25/2017 1806   APPEARANCEUR HAZY (A) 07/25/2017 1806   LABSPEC 1.018 07/25/2017 1806   PHURINE 5.0 07/25/2017 1806   GLUCOSEU NEGATIVE 07/25/2017 1806   HGBUR MODERATE (A) 07/25/2017 1806   BILIRUBINUR NEGATIVE 07/25/2017 1806   KETONESUR NEGATIVE 07/25/2017 1806   PROTEINUR 100 (A) 07/25/2017 1806   NITRITE NEGATIVE 07/25/2017 1806   LEUKOCYTESUR NEGATIVE 07/25/2017 1806     Radiology Studies: Reviewed images personally in health database    Scheduled Meds: . allopurinol  150 mg Oral Daily  . atorvastatin  20 mg Oral Daily  . benzonatate  200 mg Oral TID  . [START ON 12/09/2017] darbepoetin (ARANESP) injection - DIALYSIS  100 mcg Intravenous Q Wed-HD  . [START ON 12/08/2017] digoxin  0.0625 mg Oral QODAY  . docusate sodium  100 mg Oral BID  . insulin aspart  0-9 Units Subcutaneous TID WC  . metoprolol succinate  12.5 mg Oral BID  . midodrine  10 mg Oral TID WC  . multivitamin  1 tablet Oral QHS  . pantoprazole  40 mg Oral Daily  . sodium chloride flush  3 mL Intravenous Q12H  . sodium chloride flush  3 mL Intravenous Q12H  . warfarin  2 mg Oral ONCE-1800  . Warfarin - Pharmacist Dosing Inpatient   Does not apply q1800   Continuous Infusions: . sodium chloride       LOS: 6 days    Time spent: [place], MD [facility] ([facility]   If 7PM-7AM, please contact night-coverage [email or web address] Password [facility] 12/07/2017, 3:43 PM

## 2017-12-07 NOTE — Progress Notes (Signed)
PULMONARY / CRITICAL CARE MEDICINE   Name: Michael [name] MRN: [id number] DOB: 29-May-1961    ADMISSION DATE:  11/30/2017 CONSULTATION DATE: 12/07/2017   CHIEF COMPLAINT:  Dyspnea.  HISTORY OF PRESENT ILLNESS:        This is a 57 year old with end-stage renal disease on hemodialysis, a history of cirrhosis, history of mitral valve replacement repair and atrial fibrillation with chronic systolic dysfunction, diabetes and chronic O2 dependent lung disease who was initially admitted on 1/8 complaining of dyspnea.  He underwent 2 episodes of hemodialysis with fluid removal and was discharged home.  He returned again complaining of dyspnea on 1/15 at which time he had a temperature of 38.9 degrees.  Blood cultures were obtained which so far have shown no growth.  He was started empirically on a combination of vancomycin and cefepime.  I was asked to see him today for a persistent right pleural effusion.  A CT scan of the chest shows a moderate to large pleural effusion on the right with compressive atelectasis.  I do not appreciate ascites on the portion of the abdomen visualized on the chest CT.  There is no overt underlying mass and the fluid appears to be simple nonloculated fluid.  The patient reports that his dyspnea is back to baseline.  He denies any cough.  PAST MEDICAL HISTORY :  He  has a past medical history of Anemia of chronic disease, Asthma, Chronic kidney disease (CKD), stage IV (severe) ([place]), Chronic systolic CHF (congestive heart failure) ([place]), Cirrhosis ([place]), Coronary artery disease, Diabetes mellitus type 2 in obese ([place]), Diabetes mellitus without complication ([place]), Dyspnea, GERD (gastroesophageal reflux disease), H/O tricuspid valve repair (2012), History of echocardiogram, Hypertension, Obstructive sleep apnea, Paroxysmal atrial fibrillation ([place]), Pulmonary HTN ([place]), S/P mitral valve replacement with porcine valve (2012), and Thrombocytopenia ([place]).  PAST SURGICAL  HISTORY: He  has a past surgical history that includes Cardiac surgery; RIGHT HEART CATH (N/A, 06/29/2017); TEE without cardioversion (N/A, 07/09/2017); Cardioversion (N/A, 07/09/2017); AV fistula placement (Left, 07/13/2017); Insertion of dialysis catheter (Right, 07/13/2017); Colonoscopy (N/A, 07/16/2017); Esophagogastroduodenoscopy (N/A, 07/16/2017); Cardiac valve replacement (2011); RIGHT/LEFT HEART CATH AND CORONARY ANGIOGRAPHY (N/A, 29/03/1883); and Bascilic vein transposition (Left, 11/03/2017).  No Known Allergies  No current facility-administered medications on file prior to encounter.    Current Outpatient Medications on File Prior to Encounter  Medication Sig  . acetaminophen (TYLENOL) 325 MG tablet Take 650 mg by mouth every 6 (six) hours.   Marland Kitchen acetaminophen-codeine (TYLENOL #3) 300-30 MG tablet Take 1 tablet by mouth every 6 (six) hours as needed for moderate pain.  Marland Kitchen allopurinol (ZYLOPRIM) 300 MG tablet Take 0.5 tablets (150 mg total) by mouth daily. (Patient taking differently: Take 150 mg by mouth at bedtime. )  . atorvastatin (LIPITOR) 20 MG tablet Take 1 tablet (20 mg total) by mouth daily.  . calcium acetate (PHOSLO) 667 MG capsule Take 1 capsule (667 mg total) by mouth 3 (three) times daily with meals. (Patient taking differently: Take 667-2,001 mg by mouth See admin instructions. Pt takes 2,001mg  by mouth three times daily and 667mg  twice daily with snacks)  . colchicine 0.6 MG tablet Take 0.5 tablets (0.3 mg total) by mouth 2 (two) times a week. (Patient taking differently: Take 0.3 mg by mouth daily as needed. )  . folic acid (FOLVITE) 1 MG tablet TAKE 1 TABLET BY MOUTH DAILY. (Patient taking differently: TAKE 1 TABLET (1mg ) BY MOUTH DAILY.)  . hydrOXYzine (ATARAX/VISTARIL) 25 MG tablet 1/2-1 tablets tid prn itching (Patient  taking differently: Take 12.5-25 mg by mouth 3 (three) times daily as needed for itching. )  . metoprolol succinate (TOPROL-XL) 25 MG 24 hr tablet Take 1 tablet  (25 mg total) by mouth 2 (two) times daily. (Patient taking differently: Take 25 mg by mouth 2 (two) times daily. )  . midodrine (PROAMATINE) 5 MG tablet Take 2 tablets (10 mg total) by mouth 3 (three) times daily with meals. (Patient taking differently: Take 5 mg by mouth 3 (three) times daily. Pt takes 10mg  by mouth three times per week-10mg  by mouth one hour prior to dialysis and 10mg  by mouth after the start of dialysis)  . omeprazole (PRILOSEC) 20 MG capsule Take 1 capsule (20 mg total) by mouth daily.  Marland Kitchen oxyCODONE (OXY IR/ROXICODONE) 5 MG immediate release tablet Take 5 mg by mouth every 8 (eight) hours as needed for pain.  . OXYGEN Inhale 2 L into the lungs daily as needed.   Marland Kitchen tiZANidine (ZANAFLEX) 4 MG tablet Take 1 tablet (4 mg total) by mouth every 8 (eight) hours as needed for muscle spasms.  Marland Kitchen warfarin (COUMADIN) 2.5 MG tablet Take 1 tablet (2.5 mg total) by mouth daily at 6 PM. Or as directed by coumadin clinic (Patient taking differently: Take 2.5 mg by mouth daily at 6 PM. )    FAMILY HISTORY:  His indicated that his mother is deceased. He indicated that his father is deceased. He indicated that his sister is alive. He indicated that his brother is alive. He indicated that his maternal grandmother is deceased. He indicated that his maternal grandfather is deceased. He indicated that his paternal grandmother is deceased. He indicated that his paternal grandfather is deceased. He indicated that the status of his neg hx is unknown.   SOCIAL HISTORY: He  reports that he has quit smoking. His smoking use included cigarettes. He smoked 1.00 pack per day. he has never used smokeless tobacco. He reports that he does not drink alcohol or use drugs.   VITAL SIGNS: BP 102/71   Pulse 86   Temp 97.7 F (36.5 C) (Oral)   Resp 19   Ht 5\' 5"  (1.651 m)   Wt 131 lb 2.8 oz (59.5 kg)   SpO2 93%   BMI 21.83 kg/m   HEMODYNAMICS:    VENTILATOR SETTINGS:    INTAKE / OUTPUT: I/O last 3  completed shifts: In: 1471 [P.O.:1468; I.V.:3] Out: -   PHYSICAL EXAMINATION: General: Chronically ill-appearing male who appears to be older than his stated age.  He is able to speak to me without dyspnea. Cardiovascular: S1 and S2 are currently regular without murmur rub or gallop. Lungs: There is decreased air movement and dullness throughout the lower half of the right hemithorax.  Elsewhere there are no wheezes or rhonchi.   Abdomen: Soft nontender without any overt organomegaly masses tenderness guarding or rebound   Recent Labs  Lab 12/05/17 0657 12/06/17 0343 12/07/17 0753  NA 131* 135 133*  K 4.6 4.4 4.5  CL 97* 99* 98*  CO2 23 27 27   BUN 6 7 16   CREATININE 1.90* 2.15* 3.18*  GLUCOSE 95 85 76    Electrolytes Recent Labs  Lab 12/02/17 0429  12/05/17 0657 12/06/17 0343 12/07/17 0753  CALCIUM 8.7*   < > 8.3* 8.6* 8.5*  PHOS 2.9  --  1.8*  --  2.3*   < > = values in this interval not displayed.    CBC Recent Labs  Lab 12/05/17 0657 12/06/17 0343 12/07/17  0753  WBC 5.9 6.6 7.0  HGB 12.4* 10.6* 10.6*  HCT 35.4* 31.5* 31.0*  PLT 116* 132* 189    Coag's Recent Labs  Lab 12/05/17 0657 12/06/17 0343 12/07/17 0351  INR 2.15 2.35 2.94    Sepsis Markers Recent Labs  Lab 11/30/17 1705 11/30/17 1933 12/01/17 0055  LATICACIDVEN 2.64* 2.64* 2.51*    ABG No results for input(s): PHART, PCO2ART, PO2ART in the last 168 hours.  Liver Enzymes Recent Labs  Lab 11/30/17 1731  12/04/17 0450 12/05/17 0657 12/06/17 0343 12/07/17 0753  AST 48*  --  27  --  27  --   ALT 16*  --  13*  --  13*  --   ALKPHOS 184*  --  166*  --  174*  --   BILITOT 2.5*  --  1.9*  --  2.0*  --   ALBUMIN 3.1*   < > 2.6* 2.8* 2.6* 2.4*   < > = values in this interval not displayed.    Cardiac Enzymes No results for input(s): TROPONINI, PROBNP in the last 168 hours.  Glucose Recent Labs  Lab 12/05/17 2126 12/06/17 0820 12/06/17 1139 12/06/17 1708 12/06/17 2000  12/07/17 1243  GLUCAP 105* 143* 100* 87 117* 132*    Imaging Ct Chest Wo Contrast  Result Date: 12/07/2017 CLINICAL DATA:  Enlarging right pleural effusion and cough. History of end-stage renal disease. EXAM: CT CHEST WITHOUT CONTRAST TECHNIQUE: Multidetector CT imaging of the chest was performed following the standard protocol without IV contrast. COMPARISON:  Chest x-ray on 12/06/2017 FINDINGS: Cardiovascular: The heart is substantially enlarged. No pericardial fluid identified. Calcified coronary artery plaque identified in a 3 vessel distribution. There are some peripheral calcifications associated with the lateral wall of the right atrium. There appears to be a mitral valve replacement. The thoracic aorta shows mild scattered calcified plaque without aneurysmal disease. Central pulmonary arteries are nondilated. Mediastinum/Nodes: There are some scattered small lymph nodes in the mediastinum. The largest lower right paratracheal lymph node measures 1.3 cm in short axis. No hilar or axillary lymphadenopathy identified. Lungs/Pleura: Moderate to large sized right pleural effusion identified with fluid density consistent with probable simple fluid. The effusion is not significantly loculated and is causing compressive atelectasis the right lower lobe and middle lobe. No pleural masses identified. Some scattered scarring and atelectasis present in the aerated right lung as well as in the left lung. No obvious focal parenchymal nodules. Upper Abdomen: No significant upper abdominal findings. Musculoskeletal: No significant bony findings. IMPRESSION: 1. Substantial cardiac enlargement. Evidence of prior mitral valve replacement. 2. Coronary atherosclerosis with calcified plaque in a 3 vessel distribution. Thoracic aortic atherosclerosis also present without aneurysm. 3. Scattered small non-specific lymph nodes in the mediastinum. 4. Moderate to large right pleural effusion which appears to be of simple fluid  density and causes compressive atelectasis of the right lower lobe and middle lobe. No significant loculation or evidence of pleural masses. No obvious pulmonary malignancy. Aortic Atherosclerosis (ICD10-I70.0). Electronically Signed   By: Aletta Edouard M.D.   On: 12/07/2017 14:20      DISCUSSION:   This is a 57 year old with a history of mitral valve repair, atrial fibrillation, end-stage renal disease on dialysis that we were asked to see for a large right pleural effusion.  He states that his breathing is currently back to baseline and he is not febrile nor does he have a white count and as such I do not feel compelled to drain the effusion emergently.  I am however disturbed by the presence of a unilateral effusion which does not appear to be related to his chronic liver disease as he does not have ascites.  I would like to tap the effusion diagnostically if it does not resolve after several more dialysis sessions as it would be unusual for this to be on the basis of cardiac dysfunction/ fluid overload  and be unilateral.  Unfortunately we would need to reverse his coagulopathy to do the tap.  Please call us again if the effusion does not resolve and we can make arrangements to give him 2 units of FFP while a tap is performed.  Lars Masson, MD Pulmonary and [place] Pager: [phone]  12/07/2017, 3:59 PM

## 2017-12-07 NOTE — Progress Notes (Signed)
ANTICOAGULATION CONSULT NOTE - Follow Up Consult  Pharmacy Consult for warfarin Indication: atrial fibrillation  No Known Allergies  Patient Measurements: Height: 5\' 5"  (165.1 cm) Weight: 136 lb 14.5 oz (62.1 kg) IBW/kg (Calculated) : 61.5  Vital Signs: Temp: 97.6 F (36.4 C) (01/21 0735) Temp Source: Oral (01/21 0735) BP: 105/56 (01/21 0800) Pulse Rate: 85 (01/21 0800)  Labs: Recent Labs    12/05/17 0657 12/06/17 0343 12/07/17 0351 12/07/17 0753  HGB 12.4* 10.6*  --  10.6*  HCT 35.4* 31.5*  --  31.0*  PLT 116* 132*  --  189  LABPROT 23.9* 25.5* 30.4*  --   INR 2.15 2.35 2.94  --   CREATININE 1.90* 2.15*  --  3.18*    Estimated Creatinine Clearance: 22 mL/min (A) (by C-G formula based on SCr of 3.18 mg/dL (H)).   Medications:  Scheduled:  . allopurinol  150 mg Oral Daily  . atorvastatin  20 mg Oral Daily  . benzonatate  200 mg Oral TID  . calcium acetate  2,001 mg Oral TID WC  . [START ON 12/09/2017] darbepoetin (ARANESP) injection - DIALYSIS  100 mcg Intravenous Q Wed-HD  . [START ON 12/08/2017] digoxin  0.0625 mg Oral QODAY  . docusate sodium  100 mg Oral BID  . insulin aspart  0-9 Units Subcutaneous TID WC  . metoprolol succinate  12.5 mg Oral BID  . midodrine  10 mg Oral TID WC  . multivitamin  1 tablet Oral QHS  . pantoprazole  40 mg Oral Daily  . sodium chloride flush  3 mL Intravenous Q12H  . sodium chloride flush  3 mL Intravenous Q12H  . Warfarin - Pharmacist Dosing Inpatient   Does not apply q1800   Infusions:  . sodium chloride    . sodium chloride    . sodium chloride      Assessment: 57 y/o male with ESRD on HD on warfarin PTA for afib, admitted with chest pain, SOB. Of note patient was recently discharged from hospital 1/10 for similar symptoms, and had not taken warfarin at home since discharge. INR on admit 1.9, now back in range using PTA regimen. INR today 2.94, up from yesterday - may still be seeing effects of higher 3.75 mg dose. CBC  low, stable. No bleeding noted.  PTA regimen: warfarin 2.5 mg daily per HD clinic notes. Per anticoag clinic notes dose was increased to 3.75 mg on Wed/Sat and 2.5 mg all other days.  Goal of Therapy:  INR 2-3 Monitor platelets by anticoagulation protocol: Yes   Plan:  Warfarin 2 mg PO x 1 Daily INR, CBC Monitor s/sx of bleeding  Thank you for allowing pharmacy to be a part of this patient's care.  Alycia Rossetti, PharmD, BCPS Clinical Pharmacist Pager: [phone] Clinical phone for 12/07/2017 from 7a-3:30p: [phone] If after 3:30p, please call main pharmacy at: [phone] 12/07/2017 9:12 AM

## 2017-12-08 LAB — GLUCOSE, CAPILLARY
GLUCOSE-CAPILLARY: 110 mg/dL — AB (ref 65–99)
GLUCOSE-CAPILLARY: 72 mg/dL (ref 65–99)
Glucose-Capillary: 98 mg/dL (ref 65–99)
Glucose-Capillary: 58 mg/dL — ABNORMAL LOW (ref 65–99)

## 2017-12-08 LAB — PROTIME-INR
INR: 3.07
Prothrombin Time: 31.5 seconds — ABNORMAL HIGH (ref 11.4–15.2)

## 2017-12-08 MED ORDER — WARFARIN SODIUM 1 MG PO TABS
1.0000 mg | ORAL_TABLET | Freq: Once | ORAL | Status: AC
  Administered 2017-12-08: 1 mg via ORAL
  Filled 2017-12-08: qty 1

## 2017-12-08 MED ORDER — SODIUM CHLORIDE 0.9 % IV BOLUS (SEPSIS)
250.0000 mL | Freq: Once | INTRAVENOUS | Status: AC
  Administered 2017-12-08: 250 mL via INTRAVENOUS

## 2017-12-08 MED ORDER — DEXTROSE 50 % IV SOLN
INTRAVENOUS | Status: AC
  Filled 2017-12-08: qty 50

## 2017-12-08 MED ORDER — MIDODRINE HCL 5 MG PO TABS
10.0000 mg | ORAL_TABLET | Freq: Once | ORAL | Status: AC
  Administered 2017-12-08: 10 mg via ORAL
  Filled 2017-12-08: qty 2

## 2017-12-08 NOTE — Progress Notes (Signed)
Physical Therapy Treatment Patient Details Name: Michael [name] MRN: [id number] DOB: [birth year] Today's Date: 12/08/2017    History of Present Illness Michael [name] is a 57 y.o. male with medical history significant for end-stage renal disease, chronic pain, atrial fibrillation on warfarin, and chronic thrombocytopenia, now presenting to the emergency department from his dialysis center for evaluation of chest pain and shortness of breath.    PT Comments    Pt reported general increase in fatigue today. Pt reluctantly agreed to ambulate in the hall. Pt was min guard assist walking with RW 200 ft. Pt needed cues for more upright head and neck with gait. Pt declined LE exercises and another lap arounfd the unit due to fatigue. Pt will continue to benefit from continued acute PT while an inpatient to maximize mobility and Independence.   Follow Up Recommendations  No PT follow up     Equipment Recommendations  Rolling walker with 5" wheels    Recommendations for Other Services       Precautions / Restrictions Precautions Precautions: Fall Restrictions Weight Bearing Restrictions: No    Mobility  Bed Mobility Overal bed mobility: Needs Assistance Bed Mobility: Supine to Sit;Sit to Supine     Supine to sit: Supervision Sit to supine: Supervision   General bed mobility comments: for lines, etc  Transfers Overall transfer level: Needs assistance Equipment used: Rolling walker (2 wheeled) Transfers: Sit to/from Stand Sit to Stand: Min guard         General transfer comment: assist for safety/lines/balance, pt c/o increased fatigue today. Per RN BP has been running low.  Ambulation/Gait Ambulation/Gait assistance: Min guard Ambulation Distance (Feet): 200 Feet Assistive device: Rolling walker (2 wheeled) Gait Pattern/deviations: Step-through pattern;Trunk flexed;Decreased stride length   Gait velocity interpretation: Below normal speed for age/gender General  Gait Details: assisted pt to bathroom with min guard assist. Pt wanted to leave walker and hold onto the door and grab bar to assist with his balance.   Stairs            Wheelchair Mobility    Modified Rankin (Stroke Patients Only)       Balance Overall balance assessment: Needs assistance Sitting-balance support: No upper extremity supported Sitting balance-Leahy Scale: Good     Standing balance support: Bilateral upper extremity supported Standing balance-Leahy Scale: Fair                              Cognition Arousal/Alertness: Awake/alert Behavior During Therapy: Flat affect Overall Cognitive Status: Within Functional Limits for tasks assessed                                        Exercises      General Comments General comments (skin integrity, edema, etc.): General fatigue today      Pertinent Vitals/Pain Pain Assessment: No/denies pain    Home Living                      Prior Function            PT Goals (current goals can now be found in the care plan section) Progress towards PT goals: Progressing toward goals    Frequency    Min 3X/week      PT Plan Current plan remains appropriate    Co-evaluation  AM-PAC PT "6 Clicks" Daily Activity  Outcome Measure  Difficulty turning over in bed (including adjusting bedclothes, sheets and blankets)?: A Little Difficulty moving from lying on back to sitting on the side of the bed? : A Little Difficulty sitting down on and standing up from a chair with arms (e.g., wheelchair, bedside commode, etc,.)?: A Little Help needed moving to and from a bed to chair (including a wheelchair)?: A Little Help needed walking in hospital room?: A Little Help needed climbing 3-5 steps with a railing? : A Little 6 Click Score: 18    End of Session Equipment Utilized During Treatment: Gait belt Activity Tolerance: Patient limited by fatigue Patient left:  in bed;with call bell/phone within reach;with bed alarm set Nurse Communication: Mobility status PT Visit Diagnosis: Muscle weakness (generalized) (M62.81)     Time: 3664-4034 PT Time Calculation (min) (ACUTE ONLY): 31 min  Charges:  $Gait Training: 8-22 mins $Therapeutic Activity: 8-22 mins                    G Codes:      Theodoro Grist, PT   Lelon Mast 12/08/2017, 12:32 PM

## 2017-12-08 NOTE — Progress Notes (Addendum)
Hospitalist progress note   Michael [name]  IEP:[id number] DOB: 10/05/1961 DOA: 11/30/2017 PCP: [name], MD   Specialists:  Nephro  Brief Narrative:   43 M, ESRD MWF S-c/b hypotension on midodrine, chronic pain, afib chads score ~4 on warfarin, hepatic cirrhosis + encephalopathy chronic thrombocytopenia, CAD s/p CABG+right-sided heart failure-status post bioprosthetic MVR + tricuspid repair 2012 [place], HLD, chronic respiratory failure, type 2 diabetes mellitus previous-colonoscopy 07/16/2017 sessile polyps status post removal/Int hemorrhoids, prior gout PEA arrest 07/2017 status post CPR-at that admission left heart cath showing chronically occluded distal RDA collateralized with no intervention needed  Recent admission 1/7-1/10 with hepatic encephalopathy and placed on lactulose at the time Returns to hospital 1/15 shortness of breath-febrile 38.9 tachycardic 110 CXR?  Edema flu negative, lactic acid 2.6 troponins normal admitted with Sirs suspected sepsis    Assessment & Plan:   Assessment:  The primary encounter diagnosis was Tachycardia. Diagnoses of Precordial pain, Fever, unspecified fever cause, Cough, Shortness of breath, PNA (pneumonia), ESRD (end stage renal disease) ([place]), Hypervolemia, unspecified hypervolemia type, and Insulin dependent diabetes mellitus ([place]) were also pertinent to this visit.  Sepsis-coronovirus +, unlikely source-catheter left chest -de-escalated vanc/ceftri ->off.  BC X2 1/15 NG TD-lactic acid 2.6-2.5.  transfer to tele .  Adding hycodan for cough suppression 1/17  Pleural effusion-Ct scan 1/21 confirms free-flowing effusion-spoke with CCM --d/w Nephrology 1/22 to attempt to lower EDW-have held bb and will re-check cxr in am regarding effusion-if cannot remove more fluid will need to ask CCM to eval for thoracentesis again 1/23  ESRD hypovolemia MWFS-continue PhosLo 3 times daily-appreciate input hypotensive 1/16 probably related to sepsis secondary  to coronavirus + constitutional state-250 cc IV fluid bolus given 1/16, extra dose Midodrine-blood pressures- still low-stopping metoprolol -see below Consider lowering of EDW--note that needed Bolu IVF 250 cc again on 1/21 pm  Atrial fibrillation control complicated by Hypotension , mitral valve repair in 2012, CABG status post cath 07/2017 with PEA arrest, chads score >4- continue Coumadin-metoprolol restarted 12.5 xl-held on 1/22 Now on Digoxin 0.125-Amiodarone relatively contraindicated--dig 0.0625 qod and monitor sats and hr with ambulation.  Anemia renal disease-hemoglobin 10 but micocytic-OP colonoscopy and work-up Iron/ESa as per Nephro  Chronic pain continue OxyIR as needed Voltaren  Chronic respiratory failure on oxygen-wean as tolerated--looks improved overall  Hyperlipidemia-continue Lipitor 20 daily  Liver cirrhosis idiopathic + thrombocytopenia-continue lactulose 30 ml twice daily, hydroxyzine 20 5/2-1 as needed itching 3 times daily--TCP has improved likely 2/2 recovery viral illness  Reflux, prior colonic polyps-continue pantoprazole 40 daily  Gout continue 150 mg allopurinol daily, colchicine 0.5 twice  Weekly  DM tyii  not on meds-renal card diet-SSI-suagrs 78-98   DVT prophylaxis: lovenox  Code Status:   full   Family Communication: no family Disposition Plan:  inpatient   Consultants:   Nephrology  Procedures:   ?  Antimicrobials:   Vancomycin 1/15-1/16  ceftriaxone 1/15-1/16  Subjective:  Awake alert looks pretty good still mild cough No cp No fever No sob Eating some   Objective: Vitals:   12/08/17 0500 12/08/17 0600 12/08/17 0744 12/08/17 0800  BP: (!) 79/64 (!) 81/61 (!) 88/65 (!) 79/60  Pulse: 73 65 66 71  Resp: 14 15 18 14   Temp:   (!) 97.4 F (36.3 C)   TempSrc:   Oral   SpO2: 94% 96% 96% 96%  Weight: 60.9 kg (134 lb 4.2 oz)     Height:        Intake/Output Summary (Last 24 hours)  at 12/08/2017 0915 Last data filed at  12/08/2017 0300 Gross per 24 hour  Intake 1157 ml  Output 2588 ml  Net -1431 ml   Filed Weights   12/07/17 1142 12/07/17 2344 12/08/17 0500  Weight: 59.5 kg (131 lb 2.8 oz) 61.2 kg (134 lb 14.7 oz) 60.9 kg (134 lb 4.2 oz)    Examination:  Awake alert in nad No distress Some cough-chest dull r post with decreased fremitus and resonance to exam No cp no n/v abd soft nt nd  No le edema    Data Reviewed: I have personally reviewed following labs and imaging studies  CBC: Recent Labs  Lab 12/03/17 0722 12/04/17 0450 12/05/17 0657 12/06/17 0343 12/07/17 0753  WBC 6.0 5.8 5.9 6.6 7.0  NEUTROABS  --  2.5  --  3.3  --   HGB 9.9* 10.1* 12.4* 10.6* 10.6*  HCT 29.4* 30.0* 35.4* 31.5* 31.0*  MCV 76.0* 76.3* 76.8* 77.8* 77.1*  PLT 101* 122* 116* 132* 488   Basic Metabolic Panel: Recent Labs  Lab 12/02/17 0429 12/04/17 0450 12/05/17 0657 12/06/17 0343 12/07/17 0753  NA 129* 135 131* 135 133*  K 5.0 3.9 4.6 4.4 4.5  CL 95* 99* 97* 99* 98*  CO2 22 28 23 27 27   GLUCOSE 111* 100* 95 85 76  BUN 37* 8 6 7 16   CREATININE 3.67* 2.13* 1.90* 2.15* 3.18*  CALCIUM 8.7* 8.5* 8.3* 8.6* 8.5*  PHOS 2.9  --  1.8*  --  2.3*   GFR: Estimated Creatinine Clearance: 21.8 mL/min (A) (by C-G formula based on SCr of 3.18 mg/dL (H)). Liver Function Tests: Recent Labs  Lab 12/02/17 0429 12/04/17 0450 12/05/17 0657 12/06/17 0343 12/07/17 0753  AST  --  27  --  27  --   ALT  --  13*  --  13*  --   ALKPHOS  --  166*  --  174*  --   BILITOT  --  1.9*  --  2.0*  --   PROT  --  7.4  --  7.5  --   ALBUMIN 2.6* 2.6* 2.8* 2.6* 2.4*   No results for input(s): LIPASE, AMYLASE in the last 168 hours. No results for input(s): AMMONIA in the last 168 hours. Coagulation Profile: Recent Labs  Lab 12/04/17 0450 12/05/17 0657 12/06/17 0343 12/07/17 0351 12/08/17 0600  INR 2.35 2.15 2.35 2.94 3.07   Cardiac Enzymes: No results for input(s): CKTOTAL, CKMB, CKMBINDEX, TROPONINI in the last 168  hours. CBG: Recent Labs  Lab 12/07/17 1243 12/07/17 1646 12/07/17 2002 12/07/17 2130 12/08/17 0743  GLUCAP 132* 107* 81 78 98   Urine analysis:    Component Value Date/Time   COLORURINE AMBER (A) 07/25/2017 1806   APPEARANCEUR HAZY (A) 07/25/2017 1806   LABSPEC 1.018 07/25/2017 1806   PHURINE 5.0 07/25/2017 1806   GLUCOSEU NEGATIVE 07/25/2017 1806   HGBUR MODERATE (A) 07/25/2017 1806   BILIRUBINUR NEGATIVE 07/25/2017 1806   KETONESUR NEGATIVE 07/25/2017 1806   PROTEINUR 100 (A) 07/25/2017 1806   NITRITE NEGATIVE 07/25/2017 1806   LEUKOCYTESUR NEGATIVE 07/25/2017 1806     Radiology Studies: Reviewed images personally in health database    Scheduled Meds: . allopurinol  150 mg Oral Daily  . atorvastatin  20 mg Oral Daily  . benzonatate  200 mg Oral TID  . [START ON 12/09/2017] darbepoetin (ARANESP) injection - DIALYSIS  100 mcg Intravenous Q Wed-HD  . digoxin  0.0625 mg Oral QODAY  . docusate sodium  100 mg Oral BID  . insulin aspart  0-9 Units Subcutaneous TID WC  . midodrine  10 mg Oral TID WC  . multivitamin  1 tablet Oral QHS  . pantoprazole  40 mg Oral Daily  . sodium chloride flush  3 mL Intravenous Q12H  . sodium chloride flush  3 mL Intravenous Q12H  . warfarin  1 mg Oral ONCE-1800  . Warfarin - Pharmacist Dosing Inpatient   Does not apply q1800   Continuous Infusions: . sodium chloride       LOS: 7 days    Time spent: [place], MD [facility] ([facility]   If 7PM-7AM, please contact night-coverage [email or web address] Password [email or web address] 12/08/2017, 9:15 AM

## 2017-12-08 NOTE — Progress Notes (Addendum)
Patient hypotensive 70/58 Schorr,NP notified Gave 250 ml bolus BP increased to 81/51

## 2017-12-08 NOTE — Progress Notes (Signed)
   Daily Progress Note   Assessment/Planning:   S/p L 2nd stage BVT   Ok to use  In general, second stage transpositions can be used at 4 weeks as usually the fistula as the "second stage" is usually actually a superficialization procedure.  Maturation of the fistula is the requirement prior to proceeding with a second stage procedure.  No signs of steal   Subjective     No complaints, no steal sx   Objective   Vitals:   12/08/17 0800 12/08/17 1021 12/08/17 1225 12/08/17 1246  BP: (!) 79/60   (!) 91/59  Pulse: 71 76  76  Resp: 14   14  Temp:    (!) 97.4 F (36.3 C)  TempSrc:    Oral  SpO2: 96%  98% 97%  Weight:      Height:         Intake/Output Summary (Last 24 hours) at 12/08/2017 1310 Last data filed at 12/08/2017 1246 Gross per 24 hour  Intake 1460 ml  Output 0 ml  Net 1460 ml    VASC Palpable L UA AVF with strong thrill and bruit, incision well healed, DNVI    Laboratory   CBC CBC Latest Ref Rng & Units 12/07/2017 12/06/2017 12/05/2017  WBC 4.0 - 10.5 K/uL 7.0 6.6 5.9  Hemoglobin 13.0 - 17.0 g/dL 10.6(L) 10.6(L) 12.4(L)  Hematocrit 39.0 - 52.0 % 31.0(L) 31.5(L) 35.4(L)  Platelets 150 - 400 K/uL 189 132(L) 116(L)    BMET    Component Value Date/Time   NA 133 (L) 12/07/2017 0753   NA 140 03/30/2017 1406   K 4.5 12/07/2017 0753   CL 98 (L) 12/07/2017 0753   CO2 27 12/07/2017 0753   GLUCOSE 76 12/07/2017 0753   BUN 16 12/07/2017 0753   BUN 33 (H) 03/30/2017 1406   CREATININE 3.18 (H) 12/07/2017 0753   CREATININE 1.61 (H) 01/16/2017 1653   CALCIUM 8.5 (L) 12/07/2017 0753   GFRNONAA 20 (L) 12/07/2017 0753   GFRNONAA 47 (L) 01/16/2017 1653   GFRAA 23 (L) 12/07/2017 0753   GFRAA 54 (L) 01/16/2017 1653     Adele Barthel, MD, FACS [facility] Office: [phone] Pager: [phone]  12/08/2017, 1:10 PM

## 2017-12-08 NOTE — Progress Notes (Addendum)
Subjective: Interval History: c/o itching around HD cath  Objective: Vital signs in last 24 hours: Temp:  [97.3 F (36.3 C)-98 F (36.7 C)] 97.4 F (36.3 C) (01/22 0744) Pulse Rate:  [34-95] 76 (01/22 1021) Resp:  [13-22] 14 (01/22 0800) BP: (63-102)/(41-74) 79/60 (01/22 0800) SpO2:  [93 %-100 %] 96 % (01/22 0800) Weight:  [60.9 kg (134 lb 4.2 oz)-61.2 kg (134 lb 14.7 oz)] 60.9 kg (134 lb 4.2 oz) (01/22 0500) Weight change: -0.3 kg (-10.6 oz)  Intake/Output from previous day: 01/21 0701 - 01/22 0700 In: 1157 [P.O.:648; I.V.:9; IV Piggyback:500] Out: 2588  Intake/Output this shift: Total I/O In: 300 [P.O.:300] Out: -   General appearance: alert, cooperative and no distress Resp: rales bibasilar Chest wall: IJ cath, reddened area 4x 4 in surrounding TDC, no pus Cardio: irregularly irregular rhythm and systolic murmur: holosystolic 2/6, blowing at apex GI: liver down 4 cm Extremities: new AVF [facility]  Lab Results: Recent Labs    12/06/17 0343 12/07/17 0753  WBC 6.6 7.0  HGB 10.6* 10.6*  HCT 31.5* 31.0*  PLT 132* 189   BMET:  Recent Labs    12/06/17 0343 12/07/17 0753  NA 135 133*  K 4.4 4.5  CL 99* 98*  CO2 27 27  GLUCOSE 85 76  BUN 7 16  CREATININE 2.15* 3.18*  CALCIUM 8.6* 8.5*   No results for input(s): PTH in the last 72 hours. Iron Studies: No results for input(s): IRON, TIBC, TRANSFERRIN, FERRITIN in the last 72 hours.  Studies/Results: Ct Chest Wo Contrast  Result Date: 12/07/2017 CLINICAL DATA:  Enlarging right pleural effusion and cough. History of end-stage renal disease. EXAM: CT CHEST WITHOUT CONTRAST TECHNIQUE: Multidetector CT imaging of the chest was performed following the standard protocol without IV contrast. COMPARISON:  Chest x-ray on 12/06/2017 FINDINGS: Cardiovascular: The heart is substantially enlarged. No pericardial fluid identified. Calcified coronary artery plaque identified in a 3 vessel distribution. There are some  peripheral calcifications associated with the lateral wall of the right atrium. There appears to be a mitral valve replacement. The thoracic aorta shows mild scattered calcified plaque without aneurysmal disease. Central pulmonary arteries are nondilated. Mediastinum/Nodes: There are some scattered small lymph nodes in the mediastinum. The largest lower right paratracheal lymph node measures 1.3 cm in short axis. No hilar or axillary lymphadenopathy identified. Lungs/Pleura: Moderate to large sized right pleural effusion identified with fluid density consistent with probable simple fluid. The effusion is not significantly loculated and is causing compressive atelectasis the right lower lobe and middle lobe. No pleural masses identified. Some scattered scarring and atelectasis present in the aerated right lung as well as in the left lung. No obvious focal parenchymal nodules. Upper Abdomen: No significant upper abdominal findings. Musculoskeletal: No significant bony findings. IMPRESSION: 1. Substantial cardiac enlargement. Evidence of prior mitral valve replacement. 2. Coronary atherosclerosis with calcified plaque in a 3 vessel distribution. Thoracic aortic atherosclerosis also present without aneurysm. 3. Scattered small non-specific lymph nodes in the mediastinum. 4. Moderate to large right pleural effusion which appears to be of simple fluid density and causes compressive atelectasis of the right lower lobe and middle lobe. No significant loculation or evidence of pleural masses. No obvious pulmonary malignancy. Aortic Atherosclerosis (ICD10-I70.0). Electronically Signed   By: Aletta Edouard M.D.   On: 12/07/2017 14:20   Dg Chest Port 1 View  Result Date: 12/06/2017 CLINICAL DATA:  57 year old male with history of pneumonia. EXAM: PORTABLE CHEST 1 VIEW COMPARISON:  Chest  x-ray 12/03/2017. FINDINGS: Right internal jugular Port-A-Cath with tip terminating at the superior cavoatrial junction. Status post  median sternal. Large right pleural effusion with atelectasis and/or consolidation in the base of the right lung. Left lung is clear. Small left pleural effusion. No evidence of pulmonary edema. Cardiomegaly. The patient is rotated to the left on today's exam, resulting in distortion of the mediastinal contours and reduced diagnostic sensitivity and specificity for mediastinal pathology. Aortic atherosclerosis. IMPRESSION: Enlarging large right pleural effusion with worsening atelectasis and/or consolidation in the base of the right hemithorax. The possibility of an empyema should be considered. Electronically Signed   By: Vinnie Langton M.D.   On: 12/06/2017 13:24    I have reviewed the patient's current medications.  MWF [facility] 4h  300/800  61 kg (getting down to 63kg at center)   3K/2.25 bath  TDC R / LUA BVT+bruit (2nd stage 11/03/17 Dr Donzetta Matters)  Hep none - Mircera 6mcg IV q 2 weeks (last given 11/18/17) - No VDRA    Assessment: 1 ESRD HD MWFS  Extra HD today, try to get vol down to improve pleural effusion 2 Afib rate better anticoag 3 Anemia varying Hb, follow 4 HPTH vit D 5 DM controlled 6 MVR/TVR/Maze 7 SIRS +coronavirus URI 8 Severe heart disease/ CM/ RHF - at high risk for complications from HD  P extra HD today, reg HD wed, esa, lower vol, control rate, bs    LOS: 7 days   Sol Blazing 12/08/2017,12:15 PM

## 2017-12-08 NOTE — Progress Notes (Signed)
Hypoglycemic Event  CBG: 58  Treatment: 4oz juice  Symptoms:none  Follow-up CBG: Time: 8:27 PM  CBG Result: 81  Possible Reasons for Event: unknown  Comments/MD notified:    Angelita Harnack, Blondell Reveal

## 2017-12-08 NOTE — Progress Notes (Signed)
ANTICOAGULATION CONSULT NOTE - Follow Up Consult  Pharmacy Consult for warfarin Indication: atrial fibrillation  No Known Allergies  Patient Measurements: Height: 5\' 5"  (165.1 cm) Weight: 134 lb 4.2 oz (60.9 kg) IBW/kg (Calculated) : 61.5  Assessment: 57 y/o male with ESRD on HD on warfarin PTA for AFib, bioprosthetic MV. CHADsVASC = 3. Admit INR 1.92 - had not taken warfarin since discharged 1/10, now within range using PTA regimen. INR up to 3.07 borderline high. HGB stable. PLT 56 on admit (hx TCP), now up to 189.   PTA warf: 2.5 mg daily per med rec (previously 3.75 mg Wed/Sat)  Goal of Therapy:  INR 2-3 Monitor platelets by anticoagulation protocol: Yes   Plan:  Give Coumadin 1mg  PO x 1 Monitor daily INR, CBC, s/s of bleed  Elenor Quinones, PharmD, [facility] Clinical Pharmacist Pager [phone] 12/08/2017 8:37 AM

## 2017-12-09 ENCOUNTER — Inpatient Hospital Stay (HOSPITAL_COMMUNITY): Payer: Medicare Other

## 2017-12-09 DIAGNOSIS — I482 Chronic atrial fibrillation: Secondary | ICD-10-CM

## 2017-12-09 DIAGNOSIS — I2510 Atherosclerotic heart disease of native coronary artery without angina pectoris: Secondary | ICD-10-CM

## 2017-12-09 LAB — CBC
HEMATOCRIT: 30.9 % — AB (ref 39.0–52.0)
Hemoglobin: 10.4 g/dL — ABNORMAL LOW (ref 13.0–17.0)
MCH: 25.9 pg — ABNORMAL LOW (ref 26.0–34.0)
MCHC: 33.7 g/dL (ref 30.0–36.0)
MCV: 77.1 fL — AB (ref 78.0–100.0)
Platelets: 188 10*3/uL (ref 150–400)
RBC: 4.01 MIL/uL — AB (ref 4.22–5.81)
RDW: 17.5 % — ABNORMAL HIGH (ref 11.5–15.5)
WBC: 6.7 10*3/uL (ref 4.0–10.5)

## 2017-12-09 LAB — RENAL FUNCTION PANEL
Albumin: 2.4 g/dL — ABNORMAL LOW (ref 3.5–5.0)
Anion gap: 9 (ref 5–15)
BUN: 19 mg/dL (ref 6–20)
CHLORIDE: 98 mmol/L — AB (ref 101–111)
CO2: 27 mmol/L (ref 22–32)
Calcium: 8.4 mg/dL — ABNORMAL LOW (ref 8.9–10.3)
Creatinine, Ser: 3.43 mg/dL — ABNORMAL HIGH (ref 0.61–1.24)
GFR, EST AFRICAN AMERICAN: 21 mL/min — AB (ref >60)
GFR, EST NON AFRICAN AMERICAN: 18 mL/min — AB (ref >60)
Glucose, Bld: 93 mg/dL (ref 65–99)
POTASSIUM: 4 mmol/L (ref 3.5–5.1)
Phosphorus: 3.4 mg/dL (ref 2.5–4.6)
Sodium: 134 mmol/L — ABNORMAL LOW (ref 135–145)

## 2017-12-09 LAB — GLUCOSE, CAPILLARY
GLUCOSE-CAPILLARY: 67 mg/dL (ref 65–99)
GLUCOSE-CAPILLARY: 88 mg/dL (ref 65–99)
Glucose-Capillary: 81 mg/dL (ref 65–99)
Glucose-Capillary: 97 mg/dL (ref 65–99)
Glucose-Capillary: 170 mg/dL — ABNORMAL HIGH (ref 65–99)

## 2017-12-09 LAB — PROTIME-INR
INR: 2.91
PROTHROMBIN TIME: 30.2 s — AB (ref 11.4–15.2)

## 2017-12-09 MED ORDER — FLUTICASONE PROPIONATE 50 MCG/ACT NA SUSP
2.0000 | Freq: Every day | NASAL | Status: DC
  Administered 2017-12-09 – 2017-12-14 (×5): 2 via NASAL
  Filled 2017-12-09: qty 16

## 2017-12-09 MED ORDER — MIDODRINE HCL 5 MG PO TABS
ORAL_TABLET | ORAL | Status: AC
  Filled 2017-12-09: qty 2

## 2017-12-09 MED ORDER — WARFARIN SODIUM 1 MG PO TABS
1.0000 mg | ORAL_TABLET | Freq: Once | ORAL | Status: AC
  Administered 2017-12-09: 1 mg via ORAL
  Filled 2017-12-09: qty 1

## 2017-12-09 NOTE — Progress Notes (Signed)
PULMONARY / CRITICAL CARE MEDICINE   Name: Michael [name] MRN: [id number] DOB: 28-Oct-1961    ADMISSION DATE:  11/30/2017 CONSULTATION DATE: 12/07/2017   CHIEF COMPLAINT:  Dyspnea.  HISTORY OF PRESENT ILLNESS:        This is a 57 year old with end-stage renal disease on hemodialysis, a history of cirrhosis, history of mitral valve replacement repair and atrial fibrillation with chronic systolic dysfunction, diabetes and chronic O2 dependent lung disease who was initially admitted on 1/8 complaining of dyspnea.  He underwent 2 episodes of hemodialysis with fluid removal and was discharged home.  He returned again complaining of dyspnea on 1/15 at which time he had a temperature of 38.9 degrees.  Blood cultures were obtained which so far have shown no growth.  He was started empirically on a combination of vancomycin and cefepime.  I was asked to see him today for a persistent right pleural effusion.  A CT scan of the chest shows a moderate to large pleural effusion on the right with compressive atelectasis.  I do not appreciate ascites on the portion of the abdomen visualized on the chest CT.  There is no overt underlying mass and the fluid appears to be simple nonloculated fluid.  The patient reports that his dyspnea is back to baseline.  He denies any cough.   VITAL SIGNS: BP (!) 89/62 (BP Location: Right Arm)   Pulse 98   Temp 97.9 F (36.6 C) (Oral)   Resp 19   Ht 5\' 5"  (1.651 m)   Wt 60.5 kg (133 lb 6.1 oz)   SpO2 100%   BMI 22.20 kg/m   HEMODYNAMICS:    VENTILATOR SETTINGS:    INTAKE / OUTPUT: I/O last 3 completed shifts: In: 6294 [P.O.:1034; I.V.:6; IV Piggyback:500] Out: 0   PHYSICAL EXAMINATION: General: 57 year old no acute distress at rest on room air HEENT: No JVD lymphadenopathy is appreciated PSY: Normal effect Neuro: Intact CV: Sounds are irregular PULM: Decreased breath sounds in the right side, dull percussion 1 Half Way down GI: Distention  nontender Extremities: warm/dry, 1+ edema  Skin: no rashes or lesions     Recent Labs  Lab 12/06/17 0343 12/07/17 0753 12/09/17 0430  NA 135 133* 134*  K 4.4 4.5 4.0  CL 99* 98* 98*  CO2 27 27 27   BUN 7 16 19   CREATININE 2.15* 3.18* 3.43*  GLUCOSE 85 76 93    Electrolytes Recent Labs  Lab 12/05/17 0657 12/06/17 0343 12/07/17 0753 12/09/17 0430  CALCIUM 8.3* 8.6* 8.5* 8.4*  PHOS 1.8*  --  2.3* 3.4    CBC Recent Labs  Lab 12/06/17 0343 12/07/17 0753 12/09/17 0430  WBC 6.6 7.0 6.7  HGB 10.6* 10.6* 10.4*  HCT 31.5* 31.0* 30.9*  PLT 132* 189 188    Coag's Recent Labs  Lab 12/07/17 0351 12/08/17 0600 12/09/17 0500  INR 2.94 3.07 2.91    Sepsis Markers No results for input(s): LATICACIDVEN, PROCALCITON, O2SATVEN in the last 168 hours.  ABG No results for input(s): PHART, PCO2ART, PO2ART in the last 168 hours.  Liver Enzymes Recent Labs  Lab 12/04/17 0450  12/06/17 0343 12/07/17 0753 12/09/17 0430  AST 27  --  27  --   --   ALT 13*  --  13*  --   --   ALKPHOS 166*  --  174*  --   --   BILITOT 1.9*  --  2.0*  --   --   ALBUMIN 2.6*   < > 2.6*  2.4* 2.4*   < > = values in this interval not displayed.    Cardiac Enzymes No results for input(s): TROPONINI, PROBNP in the last 168 hours.  Glucose Recent Labs  Lab 12/08/17 0743 12/08/17 1200 12/08/17 1624 12/08/17 1926 12/08/17 2027 12/09/17 0834  GLUCAP 98 110* 72 58* 81 67    Imaging Dg Chest 2 View  Result Date: 12/09/2017 CLINICAL DATA:  Shortness of Breath EXAM: CHEST  2 VIEW COMPARISON:  12/07/2017 CT of the chest FINDINGS: Large pleural effusion on the right is again identified. Right-sided dialysis catheter is seen. Stable cardiomegaly and postsurgical changes are noted. The left lung remains clear. IMPRESSION: Stable large right-sided pleural effusion. Electronically Signed   By: Inez Catalina M.D.   On: 12/09/2017 09:54      DISCUSSION:   This is a 57 year old with a history  of mitral valve repair, atrial fibrillation, end-stage renal disease on dialysis that we were asked to see for a large right pleural effusion.  He states that his breathing is currently back to baseline and he is not febrile nor does he have a white count and as such I do not feel compelled to drain the effusion emergently.  I am however disturbed by the presence of a unilateral effusion which does not appear to be related to his chronic liver disease as he does not have ascites.  I would like to tap the effusion diagnostically if it does not resolve after several more dialysis sessions as it would be unusual for this to be on the basis of cardiac dysfunction/ fluid overload  and be unilateral.  Unfortunately we would need to reverse his coagulopathy to do the tap.   Assessment : Right-sided pleural effusion Recent sepsis end-stage renal disease Chronic pain Liver cirrhosis Gastroesophageal reflux disease Diabetes mellitus Afib   Plan: 12/09/2017 post hemodialysis right pleural effusion appears smaller on chest x-ray.  He is in no respiratory distress.  On room air with sats 97%.  His INR is noted to be 2.91 on 12/09/2017 Therefore we will do thoracentesis he would need to stop Coumadin, of vitamin K and FFP prior to procedure.  May want to consider to complete to 3 more hemodialysis to see if the effusion will improve without invasive procedure. If not we can proceed with thoracentesis.  All other issues per [facility] hospitalist service.   Richardson Landry Carrell Rahmani ACNP Maryanna Shape PCCM Pager [phone] till 1 pm If no answer page [phone] 12/09/2017, 11:20 AM

## 2017-12-09 NOTE — Progress Notes (Signed)
[facility] PROGRESS NOTE  Michael [name] Memos WER:[id number] DOB: October 15, 1961 DOA: 11/30/2017  PCP: [name], MD  Brief History/Interval Summary: 57 year old male with a past medical history of end-stage renal disease on hematoma, history of hypertension on midodrine, chronic pain, atrial fibrillation on warfarin, liver cirrhosis, coronary artery disease status post CABG, chronic respiratory failure, type 2 diabetes, history of gout presented with shortness of breath.  There was some concern for sepsis at the time of admission.  He was also noted to have pleural effusion.  Patient was hospitalized for further management.   Consultants: Nephrology.  Pulmonology.  Procedures: None  Antibiotics: Patient has completed treatment with antibiotics  Subjective/Interval History: Patient states that he feels well.  Denies any shortness of breath.  No chest pain.  No nausea or vomiting.  ROS: Denies any headaches.  Does complain of pain in his feet from gout.  Improving.  Objective:  Vital Signs  Vitals:   12/09/17 0730 12/09/17 0740 12/09/17 0836 12/09/17 1205  BP: 103/64 102/63 (!) 89/62 120/85  Pulse: 92 98 98 83  Resp: 12 13 19 20   Temp:  97.7 F (36.5 C) 97.9 F (36.6 C) 98.6 F (37 C)  TempSrc:  Oral Oral Oral  SpO2: 98% 98% 100% 100%  Weight:  60.5 kg (133 lb 6.1 oz)    Height:        Intake/Output Summary (Last 24 hours) at 12/09/2017 1235 Last data filed at 12/09/2017 1224 Gross per 24 hour  Intake 833 ml  Output 2500 ml  Net -1667 ml   Filed Weights   12/08/17 0500 12/09/17 0420 12/09/17 0740  Weight: 60.9 kg (134 lb 4.2 oz) 63.6 kg (140 lb 3.4 oz) 60.5 kg (133 lb 6.1 oz)    General appearance: alert, cooperative, appears stated age and no distress Head: Normocephalic, without obvious abnormality, atraumatic Resp: 1 S2 percussion right base.  Diminished air entry right base.  No definite crackles or wheezing. Cardio: regular rate and rhythm, S1, S2  normal, no murmur, click, rub or gallop GI: soft, non-tender; bowel sounds normal; no masses,  no organomegaly Extremities: extremities normal, atraumatic, no cyanosis or edema Neurologic: No focal deficits  Lab Results:  Data Reviewed: I have personally reviewed following labs and imaging studies  CBC: Recent Labs  Lab 12/04/17 0450 12/05/17 0657 12/06/17 0343 12/07/17 0753 12/09/17 0430  WBC 5.8 5.9 6.6 7.0 6.7  NEUTROABS 2.5  --  3.3  --   --   HGB 10.1* 12.4* 10.6* 10.6* 10.4*  HCT 30.0* 35.4* 31.5* 31.0* 30.9*  MCV 76.3* 76.8* 77.8* 77.1* 77.1*  PLT 122* 116* 132* 189 195    Basic Metabolic Panel: Recent Labs  Lab 12/04/17 0450 12/05/17 0657 12/06/17 0343 12/07/17 0753 12/09/17 0430  NA 135 131* 135 133* 134*  K 3.9 4.6 4.4 4.5 4.0  CL 99* 97* 99* 98* 98*  CO2 28 23 27 27 27   GLUCOSE 100* 95 85 76 93  BUN 8 6 7 16 19   CREATININE 2.13* 1.90* 2.15* 3.18* 3.43*  CALCIUM 8.5* 8.3* 8.6* 8.5* 8.4*  PHOS  --  1.8*  --  2.3* 3.4    GFR: Estimated Creatinine Clearance: 20.1 mL/min (A) (by C-G formula based on SCr of 3.43 mg/dL (H)).  Liver Function Tests: Recent Labs  Lab 12/04/17 0450 12/05/17 0657 12/06/17 0343 12/07/17 0753 12/09/17 0430  AST 27  --  27  --   --   ALT 13*  --  13*  --   --  ALKPHOS 166*  --  174*  --   --   BILITOT 1.9*  --  2.0*  --   --   PROT 7.4  --  7.5  --   --   ALBUMIN 2.6* 2.8* 2.6* 2.4* 2.4*    Coagulation Profile: Recent Labs  Lab 12/05/17 0657 12/06/17 0343 12/07/17 0351 12/08/17 0600 12/09/17 0500  INR 2.15 2.35 2.94 3.07 2.91    BNP (last 3 results) Recent Labs    03/16/17 1401  PROBNP 6,530*   CBG: Recent Labs  Lab 12/08/17 1624 12/08/17 1926 12/08/17 2027 12/09/17 0834 12/09/17 1204  GLUCAP 72 58* 81 67 88     Recent Results (from the past 240 hour(s))  Blood culture (routine x 2)     Status: None   Collection Time: 11/30/17  5:40 PM  Result Value Ref Range Status   Specimen Description  BLOOD RIGHT ANTECUBITAL  Final   Special Requests IN PEDIATRIC BOTTLE Blood Culture adequate volume  Final   Culture NO GROWTH 5 DAYS  Final   Report Status 12/05/2017 FINAL  Final  Blood culture (routine x 2)     Status: None   Collection Time: 11/30/17  5:40 PM  Result Value Ref Range Status   Specimen Description BLOOD RIGHT ANTECUBITAL  Final   Special Requests IN PEDIATRIC BOTTLE Blood Culture adequate volume  Final   Culture NO GROWTH 5 DAYS  Final   Report Status 12/05/2017 FINAL  Final  Respiratory Panel by PCR     Status: Abnormal   Collection Time: 11/30/17 11:08 PM  Result Value Ref Range Status   Adenovirus NOT DETECTED NOT DETECTED Final   Coronavirus 229E NOT DETECTED NOT DETECTED Final   Coronavirus HKU1 NOT DETECTED NOT DETECTED Final   Coronavirus NL63 NOT DETECTED NOT DETECTED Final   Coronavirus OC43 DETECTED (A) NOT DETECTED Final   Metapneumovirus NOT DETECTED NOT DETECTED Final   Rhinovirus / Enterovirus NOT DETECTED NOT DETECTED Final   Influenza A NOT DETECTED NOT DETECTED Final   Influenza B NOT DETECTED NOT DETECTED Final   Parainfluenza Virus 1 NOT DETECTED NOT DETECTED Final   Parainfluenza Virus 2 NOT DETECTED NOT DETECTED Final   Parainfluenza Virus 3 NOT DETECTED NOT DETECTED Final   Parainfluenza Virus 4 NOT DETECTED NOT DETECTED Final   Respiratory Syncytial Virus NOT DETECTED NOT DETECTED Final   Bordetella pertussis NOT DETECTED NOT DETECTED Final   Chlamydophila pneumoniae NOT DETECTED NOT DETECTED Final   Mycoplasma pneumoniae NOT DETECTED NOT DETECTED Final  MRSA PCR Screening     Status: None   Collection Time: 12/02/17  4:17 AM  Result Value Ref Range Status   MRSA by PCR NEGATIVE NEGATIVE Final    Comment:        The GeneXpert MRSA Assay (FDA approved for NASAL specimens only), is one component of a comprehensive MRSA colonization surveillance program. It is not intended to diagnose MRSA infection nor to guide or monitor treatment  for MRSA infections.       Radiology Studies: Dg Chest 2 View  Result Date: 12/09/2017 CLINICAL DATA:  Shortness of Breath EXAM: CHEST  2 VIEW COMPARISON:  12/07/2017 CT of the chest FINDINGS: Large pleural effusion on the right is again identified. Right-sided dialysis catheter is seen. Stable cardiomegaly and postsurgical changes are noted. The left lung remains clear. IMPRESSION: Stable large right-sided pleural effusion. Electronically Signed   By: Inez Catalina M.D.   On: 12/09/2017 09:54   Ct  Chest Wo Contrast  Result Date: 12/07/2017 CLINICAL DATA:  Enlarging right pleural effusion and cough. History of end-stage renal disease. EXAM: CT CHEST WITHOUT CONTRAST TECHNIQUE: Multidetector CT imaging of the chest was performed following the standard protocol without IV contrast. COMPARISON:  Chest x-ray on 12/06/2017 FINDINGS: Cardiovascular: The heart is substantially enlarged. No pericardial fluid identified. Calcified coronary artery plaque identified in a 3 vessel distribution. There are some peripheral calcifications associated with the lateral wall of the right atrium. There appears to be a mitral valve replacement. The thoracic aorta shows mild scattered calcified plaque without aneurysmal disease. Central pulmonary arteries are nondilated. Mediastinum/Nodes: There are some scattered small lymph nodes in the mediastinum. The largest lower right paratracheal lymph node measures 1.3 cm in short axis. No hilar or axillary lymphadenopathy identified. Lungs/Pleura: Moderate to large sized right pleural effusion identified with fluid density consistent with probable simple fluid. The effusion is not significantly loculated and is causing compressive atelectasis the right lower lobe and middle lobe. No pleural masses identified. Some scattered scarring and atelectasis present in the aerated right lung as well as in the left lung. No obvious focal parenchymal nodules. Upper Abdomen: No significant upper  abdominal findings. Musculoskeletal: No significant bony findings. IMPRESSION: 1. Substantial cardiac enlargement. Evidence of prior mitral valve replacement. 2. Coronary atherosclerosis with calcified plaque in a 3 vessel distribution. Thoracic aortic atherosclerosis also present without aneurysm. 3. Scattered small non-specific lymph nodes in the mediastinum. 4. Moderate to large right pleural effusion which appears to be of simple fluid density and causes compressive atelectasis of the right lower lobe and middle lobe. No significant loculation or evidence of pleural masses. No obvious pulmonary malignancy. Aortic Atherosclerosis (ICD10-I70.0). Electronically Signed   By: Aletta Edouard M.D.   On: 12/07/2017 14:20     Medications:  Scheduled: . allopurinol  150 mg Oral Daily  . atorvastatin  20 mg Oral Daily  . benzonatate  200 mg Oral TID  . darbepoetin (ARANESP) injection - DIALYSIS  100 mcg Intravenous Q Wed-HD  . digoxin  0.0625 mg Oral QODAY  . docusate sodium  100 mg Oral BID  . insulin aspart  0-9 Units Subcutaneous TID WC  . midodrine  10 mg Oral TID WC  . multivitamin  1 tablet Oral QHS  . pantoprazole  40 mg Oral Daily  . sodium chloride flush  3 mL Intravenous Q12H  . sodium chloride flush  3 mL Intravenous Q12H  . warfarin  1 mg Oral ONCE-1800  . Warfarin - Pharmacist Dosing Inpatient   Does not apply q1800   Continuous: . sodium chloride     OZD:GUYQIH chloride, acetaminophen **OR** acetaminophen, acetaminophen-codeine, diclofenac sodium, HYDROcodone-homatropine, hydrocortisone cream, hydrOXYzine, oxyCODONE, sodium chloride flush, tiZANidine  Assessment/Plan:  Principal Problem:   SIRS (systemic inflammatory response syndrome) (HCC) Active Problems:   Atrial fibrillation with RVR (HCC)   Insulin dependent diabetes mellitus (HCC)   ESRD (end stage renal disease) on dialysis (HCC)   Chest pain   CAD (coronary artery disease)   Fluid overload   ESRD (end stage renal  disease) (HCC)    Sepsis Respiratory viral panel was positive for coronavirus.  He was initially placed on vancomycin and ceftriaxone.  Currently off of antibiotics.  Initially had elevated lactic acid levels as well.  Currently he does not have any sepsis physiology.  Right-sided pleural effusion Etiology for this effusion is not entirely clear.  Patient is getting frequent dialysis to see if that would decrease the effusion.  Pulmonology has also been consulted.  Patient is on warfarin.  If he needs to have thoracentesis, warfarin will have to be reversed.  Chest x-ray done this morning does not show a significant change compared to previous films.  End-stage renal disease on hemodialysis on Monday Wednesday Friday Nephrology is following.  Patient also has chronic hypertension and is on midodrine.  Chronic Atrial fibrillation He is anticoagulated with warfarin.  Pharmacy is managing.  On digoxin.  History of coronary artery disease status post CABG/mitral valve repair Stable.  Continue home medications.  Not on usual cardiac medications due to hypotension.  Anemia of chronic kidney disease Hemoglobin is stable.  No evidence for overt bleeding.  Chronic pain Continue home medications.  Chronic respiratory failure on home oxygen Stable.  Wean as tolerated.  History of liver cirrhosis, idiopathic with thrombocytopenia Continue lactulose.  Currently not encephalopathic.  History of gout Continue home medications.  Diabetes mellitus type 2 Not on medications.  Continue SSI.   DVT Prophylaxis: On warfarin    Code Status: Full code Family Communication: Discussed with the patient Disposition Plan: Management as outlined above.    LOS: 8 days   [facility] Pager [phone] 12/09/2017, 12:35 PM  If 7PM-7AM, please contact night-coverage at [email or web address], password [facility]

## 2017-12-09 NOTE — Progress Notes (Signed)
Subjective: Interval History: COUGHING and breathing better  Objective: Vital signs in last 24 hours: Temp:  [97.4 F (36.3 C)-98.6 F (37 C)] 98.6 F (37 C) (01/23 1205) Pulse Rate:  [61-98] 83 (01/23 1205) Resp:  [12-23] 20 (01/23 1205) BP: (89-127)/(54-85) 120/85 (01/23 1205) SpO2:  [95 %-100 %] 100 % (01/23 1205) Weight:  [60.5 kg (133 lb 6.1 oz)-63.6 kg (140 lb 3.4 oz)] 60.5 kg (133 lb 6.1 oz) (01/23 0740) Weight change: 1.5 kg (3 lb 4.9 oz)  Intake/Output from previous day: 01/22 0701 - 01/23 0700 In: 893 [P.O.:890; I.V.:3] Out: 0  Intake/Output this shift: Total I/O In: 240 [P.O.:240] Out: 2500 [Other:2500]  General appearance: alert, cooperative and no distress Resp: mostly clear bilat, dec'd R base Chest wall: IJ cath, reddened area 4x 4 in surrounding TDC, no pus  Cardio: irregularly irregular rhythm, 2/6 sem GI: liver down 4 cm Extremities: new AVF [name]  Lab Results: Recent Labs    12/07/17 0753 12/09/17 0430  WBC 7.0 6.7  HGB 10.6* 10.4*  HCT 31.0* 30.9*  PLT 189 188   BMET:  Recent Labs    12/07/17 0753 12/09/17 0430  NA 133* 134*  K 4.5 4.0  CL 98* 98*  CO2 27 27  GLUCOSE 76 93  BUN 16 19  CREATININE 3.18* 3.43*  CALCIUM 8.5* 8.4*   No results for input(s): PTH in the last 72 hours. Iron Studies: No results for input(s): IRON, TIBC, TRANSFERRIN, FERRITIN in the last 72 hours.    MWF [facility] 4h  300/800  61 kg (getting down to 63kg at center)   3K/2.25 bath  TDC R / LUA BVT+bruit (2nd stage 11/03/17 Dr Donzetta Matters)  Hep none - Mircera 76mcg IV q 2 weeks (last given 11/18/17) - No VDRA    Assessment: 1 ESRD HD MWFS, came off HD 8 am today 2 R pleural effusion, improving 3 Vol improving, under dry wt, counseled to limit fluids  4 Afib rate better anticoag 5 Anemia varying Hb, follow 6 HPTH vit D 7 DM controlled 8 MVR/TVR/Maze 9 SIRS +coronavirus URI 10 Severe heart disease/ CM/ RHF - at high risk for complications from HD  P HD  thurs off schedule, cont lower dry   LOS: 8 days   Sol Blazing 12/09/2017,2:05 PM

## 2017-12-09 NOTE — Progress Notes (Signed)
ANTICOAGULATION CONSULT NOTE - Follow Up Consult  Pharmacy Consult for warfarin Indication: atrial fibrillation  No Known Allergies  Patient Measurements: Height: 5\' 5"  (165.1 cm) Weight: 133 lb 6.1 oz (60.5 kg) IBW/kg (Calculated) : 61.5  Assessment: 57 y/o male with ESRD on HD on warfarin PTA for AFib, bioprosthetic MV. CHADsVASC = 3. Admit INR 1.92 - had not taken warfarin since discharged 1/10, now within range using PTA regimen.   INR now 2.91, CBC stable  PTA warf: 2.5 mg daily per med rec (previously 3.75 mg Wed/Sat)  Goal of Therapy:  INR 2-3 Monitor platelets by anticoagulation protocol: Yes   Plan:  Warfarin 1 mg x 1 Monitor daily INR, CBC, s/s of bleed  Levester Fresh, PharmD, BCPS, BCCCP Clinical Pharmacist Clinical phone for 12/09/2017 from 7a-3:30p: [phone] If after 3:30p, please call main pharmacy at: [phone] 12/09/2017 9:12 AM

## 2017-12-10 DIAGNOSIS — Z862 Personal history of diseases of the blood and blood-forming organs and certain disorders involving the immune mechanism: Secondary | ICD-10-CM

## 2017-12-10 LAB — CBC
HCT: 31.9 % — ABNORMAL LOW (ref 39.0–52.0)
Hemoglobin: 10.6 g/dL — ABNORMAL LOW (ref 13.0–17.0)
MCH: 25.5 pg — ABNORMAL LOW (ref 26.0–34.0)
MCHC: 33.2 g/dL (ref 30.0–36.0)
MCV: 76.7 fL — ABNORMAL LOW (ref 78.0–100.0)
Platelets: 172 10*3/uL (ref 150–400)
RBC: 4.16 MIL/uL — ABNORMAL LOW (ref 4.22–5.81)
RDW: 17.5 % — AB (ref 11.5–15.5)
WBC: 6.2 10*3/uL (ref 4.0–10.5)

## 2017-12-10 LAB — RENAL FUNCTION PANEL
ALBUMIN: 2.5 g/dL — AB (ref 3.5–5.0)
Anion gap: 11 (ref 5–15)
BUN: 14 mg/dL (ref 6–20)
CALCIUM: 8.5 mg/dL — AB (ref 8.9–10.3)
CO2: 25 mmol/L (ref 22–32)
CREATININE: 2.95 mg/dL — AB (ref 0.61–1.24)
Chloride: 97 mmol/L — ABNORMAL LOW (ref 101–111)
GFR calc Af Amer: 25 mL/min — ABNORMAL LOW (ref >60)
GFR calc non Af Amer: 22 mL/min — ABNORMAL LOW (ref >60)
GLUCOSE: 81 mg/dL (ref 65–99)
PHOSPHORUS: 3.7 mg/dL (ref 2.5–4.6)
Potassium: 3.7 mmol/L (ref 3.5–5.1)
SODIUM: 133 mmol/L — AB (ref 135–145)

## 2017-12-10 LAB — GLUCOSE, CAPILLARY
GLUCOSE-CAPILLARY: 95 mg/dL (ref 65–99)
GLUCOSE-CAPILLARY: 81 mg/dL (ref 65–99)
GLUCOSE-CAPILLARY: 111 mg/dL — AB (ref 65–99)
Glucose-Capillary: 96 mg/dL (ref 65–99)

## 2017-12-10 LAB — PROTIME-INR
INR: 2.93
PROTHROMBIN TIME: 30.3 s — AB (ref 11.4–15.2)

## 2017-12-10 NOTE — Progress Notes (Signed)
[facility] PROGRESS NOTE  Michael [name] BOF:[id number] DOB: [birth year] DOA: 11/30/2017  PCP: [name], MD  Brief History/Interval Summary: 57 year old male with a past medical history of end-stage renal disease on hematoma, history of hypertension on midodrine, chronic pain, atrial fibrillation on warfarin, liver cirrhosis, coronary artery disease status post CABG, chronic respiratory failure, type 2 diabetes, history of gout presented with shortness of breath.  There was some concern for sepsis at the time of admission.  He was also noted to have pleural effusion.  Patient was hospitalized for further management.  Consultants: Nephrology.  Pulmonology.  Procedures: None  Antibiotics: Patient has completed treatment with antibiotics  Subjective/Interval History: Patient states that his breathing is about the same.  Feels better but not considerably so.  Denies any cough.  No chest pain.  ROS: Pain involving his feet has improved.  Objective:  Vital Signs  Vitals:   12/10/17 0850 12/10/17 0855 12/10/17 0900 12/10/17 0915  BP: (!) 65/27 (!) 85/46 (!) 82/54 98/63  Pulse: (!) 129 (!) 107 (!) 118 (!) 120  Resp: (!) 24 (!) 24 16 14   Temp:      TempSrc:      SpO2:   100%   Weight:      Height:        Intake/Output Summary (Last 24 hours) at 12/10/2017 0927 Last data filed at 12/10/2017 0300 Gross per 24 hour  Intake 420 ml  Output -  Net 420 ml   Filed Weights   12/09/17 0420 12/09/17 0740 12/10/17 0622  Weight: 63.6 kg (140 lb 3.4 oz) 60.5 kg (133 lb 6.1 oz) 63 kg (138 lb 14.2 oz)    General appearance: Awake alert.  In no distress. Resp: Dullness to percussion right base.  Diminished air entry.  No definite crackles or wheezing.   Cardio: S1-S2 is normal regular.  No S3-S4.  No rubs murmurs of bruit GI: Abdomen is soft.  Nontender nondistended.  Bowel sounds are present.  No masses organomegaly Extremities: No swelling or erythema noted both  feet Neurologic: No focal deficits  Lab Results:  Data Reviewed: I have personally reviewed following labs and imaging studies  CBC: Recent Labs  Lab 12/04/17 0450 12/05/17 0657 12/06/17 0343 12/07/17 0753 12/09/17 0430  WBC 5.8 5.9 6.6 7.0 6.7  NEUTROABS 2.5  --  3.3  --   --   HGB 10.1* 12.4* 10.6* 10.6* 10.4*  HCT 30.0* 35.4* 31.5* 31.0* 30.9*  MCV 76.3* 76.8* 77.8* 77.1* 77.1*  PLT 122* 116* 132* 189 778    Basic Metabolic Panel: Recent Labs  Lab 12/04/17 0450 12/05/17 0657 12/06/17 0343 12/07/17 0753 12/09/17 0430  NA 135 131* 135 133* 134*  K 3.9 4.6 4.4 4.5 4.0  CL 99* 97* 99* 98* 98*  CO2 28 23 27 27 27   GLUCOSE 100* 95 85 76 93  BUN 8 6 7 16 19   CREATININE 2.13* 1.90* 2.15* 3.18* 3.43*  CALCIUM 8.5* 8.3* 8.6* 8.5* 8.4*  PHOS  --  1.8*  --  2.3* 3.4    GFR: Estimated Creatinine Clearance: 20.4 mL/min (A) (by C-G formula based on SCr of 3.43 mg/dL (H)).  Liver Function Tests: Recent Labs  Lab 12/04/17 0450 12/05/17 0657 12/06/17 0343 12/07/17 0753 12/09/17 0430  AST 27  --  27  --   --   ALT 13*  --  13*  --   --   ALKPHOS 166*  --  174*  --   --  BILITOT 1.9*  --  2.0*  --   --   PROT 7.4  --  7.5  --   --   ALBUMIN 2.6* 2.8* 2.6* 2.4* 2.4*    Coagulation Profile: Recent Labs  Lab 12/06/17 0343 12/07/17 0351 12/08/17 0600 12/09/17 0500 12/10/17 0419  INR 2.35 2.94 3.07 2.91 2.93    BNP (last 3 results) Recent Labs    03/16/17 1401  PROBNP 6,530*   CBG: Recent Labs  Lab 12/08/17 2027 12/09/17 0834 12/09/17 1204 12/09/17 1647 12/09/17 2001  GLUCAP 81 67 88 97 170*     Recent Results (from the past 240 hour(s))  Blood culture (routine x 2)     Status: None   Collection Time: 11/30/17  5:40 PM  Result Value Ref Range Status   Specimen Description BLOOD RIGHT ANTECUBITAL  Final   Special Requests IN PEDIATRIC BOTTLE Blood Culture adequate volume  Final   Culture NO GROWTH 5 DAYS  Final   Report Status 12/05/2017  FINAL  Final  Blood culture (routine x 2)     Status: None   Collection Time: 11/30/17  5:40 PM  Result Value Ref Range Status   Specimen Description BLOOD RIGHT ANTECUBITAL  Final   Special Requests IN PEDIATRIC BOTTLE Blood Culture adequate volume  Final   Culture NO GROWTH 5 DAYS  Final   Report Status 12/05/2017 FINAL  Final  Respiratory Panel by PCR     Status: Abnormal   Collection Time: 11/30/17 11:08 PM  Result Value Ref Range Status   Adenovirus NOT DETECTED NOT DETECTED Final   Coronavirus 229E NOT DETECTED NOT DETECTED Final   Coronavirus HKU1 NOT DETECTED NOT DETECTED Final   Coronavirus NL63 NOT DETECTED NOT DETECTED Final   Coronavirus OC43 DETECTED (A) NOT DETECTED Final   Metapneumovirus NOT DETECTED NOT DETECTED Final   Rhinovirus / Enterovirus NOT DETECTED NOT DETECTED Final   Influenza A NOT DETECTED NOT DETECTED Final   Influenza B NOT DETECTED NOT DETECTED Final   Parainfluenza Virus 1 NOT DETECTED NOT DETECTED Final   Parainfluenza Virus 2 NOT DETECTED NOT DETECTED Final   Parainfluenza Virus 3 NOT DETECTED NOT DETECTED Final   Parainfluenza Virus 4 NOT DETECTED NOT DETECTED Final   Respiratory Syncytial Virus NOT DETECTED NOT DETECTED Final   Bordetella pertussis NOT DETECTED NOT DETECTED Final   Chlamydophila pneumoniae NOT DETECTED NOT DETECTED Final   Mycoplasma pneumoniae NOT DETECTED NOT DETECTED Final  MRSA PCR Screening     Status: None   Collection Time: 12/02/17  4:17 AM  Result Value Ref Range Status   MRSA by PCR NEGATIVE NEGATIVE Final    Comment:        The GeneXpert MRSA Assay (FDA approved for NASAL specimens only), is one component of a comprehensive MRSA colonization surveillance program. It is not intended to diagnose MRSA infection nor to guide or monitor treatment for MRSA infections.       Radiology Studies: Dg Chest 2 View  Result Date: 12/09/2017 CLINICAL DATA:  Shortness of Breath EXAM: CHEST  2 VIEW COMPARISON:   12/07/2017 CT of the chest FINDINGS: Large pleural effusion on the right is again identified. Right-sided dialysis catheter is seen. Stable cardiomegaly and postsurgical changes are noted. The left lung remains clear. IMPRESSION: Stable large right-sided pleural effusion. Electronically Signed   By: Inez Catalina M.D.   On: 12/09/2017 09:54     Medications:  Scheduled: . allopurinol  150 mg Oral Daily  .  atorvastatin  20 mg Oral Daily  . benzonatate  200 mg Oral TID  . darbepoetin (ARANESP) injection - DIALYSIS  100 mcg Intravenous Q Wed-HD  . digoxin  0.0625 mg Oral QODAY  . docusate sodium  100 mg Oral BID  . fluticasone  2 spray Each Nare Daily  . insulin aspart  0-9 Units Subcutaneous TID WC  . midodrine  10 mg Oral TID WC  . multivitamin  1 tablet Oral QHS  . pantoprazole  40 mg Oral Daily  . sodium chloride flush  3 mL Intravenous Q12H  . sodium chloride flush  3 mL Intravenous Q12H  . Warfarin - Pharmacist Dosing Inpatient   Does not apply q1800   Continuous: . sodium chloride     HUT:MLYYTK chloride, acetaminophen **OR** acetaminophen, acetaminophen-codeine, diclofenac sodium, HYDROcodone-homatropine, hydrocortisone cream, hydrOXYzine, oxyCODONE, sodium chloride flush, tiZANidine  Assessment/Plan:  Principal Problem:   SIRS (systemic inflammatory response syndrome) (HCC) Active Problems:   Atrial fibrillation with RVR (HCC)   Insulin dependent diabetes mellitus (HCC)   ESRD (end stage renal disease) on dialysis (HCC)   Chest pain   CAD (coronary artery disease)   Fluid overload   ESRD (end stage renal disease) (HCC)    Sepsis Respiratory viral panel was positive for coronavirus.  He was initially placed on vancomycin and ceftriaxone.  Currently off of antibiotics.  Initially had elevated lactic acid levels as well.  Currently he does not have sepsis physiology.  Right-sided pleural effusion Etiology for this effusion is not entirely clear.  Patient is getting  frequent dialysis to see if that would decrease the effusion.  Pulmonology has also been consulted.  Patient has been on warfarin which has been held by pulmonology in case patient needs to undergo thoracentesis.  Chest x-ray continues to show right-sided effusion.  End-stage renal disease on hemodialysis on Monday Wednesday Friday Nephrology is following.  Patient being dialyzed frequently.  Patient also has chronic hypertension and is on midodrine.  Chronic Atrial fibrillation He is anticoagulated with warfarin.  Pharmacy has been managing with this is on hold currently in case patient needs to undergo thoracentesis. On digoxin.  History of coronary artery disease status post CABG/mitral valve repair Stable.  Continue home medications.  Not on usual cardiac medications due to hypotension.  Anemia of chronic kidney disease Hemoglobin is stable.  No evidence for overt bleeding.  Chronic pain Continue home medications.  Chronic respiratory failure on home oxygen Stable.  History of liver cirrhosis, idiopathic with thrombocytopenia Continue lactulose.  Currently not encephalopathic.  History of gout Continue home medications.  Diabetes mellitus type 2 Not on medications.  Continue SSI.   DVT Prophylaxis: On warfarin    Code Status: Full code Family Communication: Discussed with the patient Disposition Plan: Management as outlined above.    LOS: 9 days   [facility] Pager [phone] 12/10/2017, 9:27 AM  If 7PM-7AM, please contact night-coverage at [email or web address], password [facility]

## 2017-12-10 NOTE — Progress Notes (Signed)
[facility] Progress Note   Subjective:   Patient seen while on HD. Breathing improved, no coughing today.  Objective Vitals:   12/10/17 0500 12/10/17 0600 12/10/17 0622 12/10/17 0728  BP: (!) 96/58 107/71  100/74  Pulse: 100 89  96  Resp: 17 18  (!) 21  Temp:    98.5 F (36.9 C)  TempSrc:    Oral  SpO2: 99% 96%  96%  Weight:   63 kg (138 lb 14.2 oz)   Height:       Physical Exam General:NAD, chronically ill appearing, thin male Heart:tachycardia, irregular rhythm, 2/6 systolic murmur Lungs: mostly CTAB, decreased BS on R Abdomen:soft, NT, ND Extremities:no edema, +ischemic changes b/l Dialysis Access: LU AVF +t, R IJ TDC in use  Filed Weights   12/09/17 0420 12/09/17 0740 12/10/17 0622  Weight: 63.6 kg (140 lb 3.4 oz) 60.5 kg (133 lb 6.1 oz) 63 kg (138 lb 14.2 oz)    Intake/Output Summary (Last 24 hours) at 12/10/2017 0850 Last data filed at 12/10/2017 0300 Gross per 24 hour  Intake 420 ml  Output -  Net 420 ml    Additional Objective Labs: Basic Metabolic Panel: Recent Labs  Lab 12/05/17 0657 12/06/17 0343 12/07/17 0753 12/09/17 0430  NA 131* 135 133* 134*  K 4.6 4.4 4.5 4.0  CL 97* 99* 98* 98*  CO2 23 27 27 27   GLUCOSE 95 85 76 93  BUN 6 7 16 19   CREATININE 1.90* 2.15* 3.18* 3.43*  CALCIUM 8.3* 8.6* 8.5* 8.4*  PHOS 1.8*  --  2.3* 3.4   Liver Function Tests: Recent Labs  Lab 12/04/17 0450  12/06/17 0343 12/07/17 0753 12/09/17 0430  AST 27  --  27  --   --   ALT 13*  --  13*  --   --   ALKPHOS 166*  --  174*  --   --   BILITOT 1.9*  --  2.0*  --   --   PROT 7.4  --  7.5  --   --   ALBUMIN 2.6*   < > 2.6* 2.4* 2.4*   < > = values in this interval not displayed.   CBC: Recent Labs  Lab 12/04/17 0450 12/05/17 0657 12/06/17 0343 12/07/17 0753 12/09/17 0430  WBC 5.8 5.9 6.6 7.0 6.7  NEUTROABS 2.5  --  3.3  --   --   HGB 10.1* 12.4* 10.6* 10.6* 10.4*  HCT 30.0* 35.4* 31.5* 31.0* 30.9*  MCV 76.3* 76.8* 77.8* 77.1* 77.1*   PLT 122* 116* 132* 189 188   CBG: Recent Labs  Lab 12/08/17 2027 12/09/17 0834 12/09/17 1204 12/09/17 1647 12/09/17 2001  GLUCAP 81 67 88 97 170*    Lab Results  Component Value Date   INR 2.93 12/10/2017   INR 2.91 12/09/2017   INR 3.07 12/08/2017   Studies/Results: Dg Chest 2 View  Result Date: 12/09/2017 CLINICAL DATA:  Shortness of Breath EXAM: CHEST  2 VIEW COMPARISON:  12/07/2017 CT of the chest FINDINGS: Large pleural effusion on the right is again identified. Right-sided dialysis catheter is seen. Stable cardiomegaly and postsurgical changes are noted. The left lung remains clear. IMPRESSION: Stable large right-sided pleural effusion. Electronically Signed   By: Inez Catalina M.D.   On: 12/09/2017 09:54    Medications: . sodium chloride     . allopurinol  150 mg Oral Daily  . atorvastatin  20 mg Oral Daily  . benzonatate  200 mg Oral TID  .  darbepoetin (ARANESP) injection - DIALYSIS  100 mcg Intravenous Q Wed-HD  . digoxin  0.0625 mg Oral QODAY  . docusate sodium  100 mg Oral BID  . fluticasone  2 spray Each Nare Daily  . insulin aspart  0-9 Units Subcutaneous TID WC  . midodrine  10 mg Oral TID WC  . multivitamin  1 tablet Oral QHS  . pantoprazole  40 mg Oral Daily  . sodium chloride flush  3 mL Intravenous Q12H  . sodium chloride flush  3 mL Intravenous Q12H  . Warfarin - Pharmacist Dosing Inpatient   Does not apply q1800    Dialysis Orders: MWF [facility] 4h  300/800  61 kg (getting down to 63kg at center)   3K/2.25 bath  TDC R / LUA BVT+bruit (2nd stage 11/03/17 Dr Donzetta Matters)  Hep none - Mircera 18mcg IV q 2 weeks (last given 11/18/17) - No VDRA   Assessment/Plan: 1. SIRS likely 2/2 +coronavirus - Off ABX. Lactic acid normalized. Per primary 2. R pleural effusion - last CXR 1/23 showed stable large R pleural effusion. Pulm consulting. Warfarin held in case of thoracentesis. 3. A fib - rate improved. On dig. Warfarin held. Per pharm/primary 4. ESRD - HD today  off schedule. Plan for next HD on Sat.  5. Anemia of CKD- Hgb 10.6. 100 mcg Aranesp given 1/16. No indication for ESA at this time.   6. Secondary hyperparathyroidism - Ca and P in goal. No VDRA.  7. HTN/volume - BP soft, on midodrine. Under EDW, volume appears stable. Titrate down as tolerated, continue to lower EDW. 8. Nutrition - Alb 2.5. Renal diet with fluid restrictions. Renavite. Nepro. 9. S/p MVR/TNR/Maze - per primary 10. Severe heart disease/CM/RHF - high risk pt for complications with HD.  11. Chronic Resp Failure - on home O2 12. H/o cirrhosis - per primary 13. DMT2 - diet controlled.    Jen Mow, PA-C [facility] Pager: [phone] 12/10/2017,8:50 AM  LOS: 9 days   Pt seen, examined and agree w A/P as above.  Kelly Splinter MD [employer] pager [phone]   12/11/2017, 8:40 AM

## 2017-12-10 NOTE — Progress Notes (Signed)
PT Cancellation Note  Patient Details Name: Michael [name] MRN: [id number] DOB: 07-Feb-1961   Cancelled Treatment:    Reason Eval/Treat Not Completed: Patient at procedure or test/unavailable(Pt in HD.  Will return as able in pm.  Thanks. )   Godfrey Pick Leonardo Plaia 12/10/2017, 8:53 AM  [facility] [phone] (pager)

## 2017-12-11 ENCOUNTER — Inpatient Hospital Stay (HOSPITAL_COMMUNITY): Payer: Medicare Other

## 2017-12-11 DIAGNOSIS — R6510 Systemic inflammatory response syndrome (SIRS) of non-infectious origin without acute organ dysfunction: Secondary | ICD-10-CM

## 2017-12-11 LAB — BASIC METABOLIC PANEL
Anion gap: 10 (ref 5–15)
BUN: 11 mg/dL (ref 6–20)
CO2: 26 mmol/L (ref 22–32)
Calcium: 8.7 mg/dL — ABNORMAL LOW (ref 8.9–10.3)
Chloride: 98 mmol/L — ABNORMAL LOW (ref 101–111)
Creatinine, Ser: 2.73 mg/dL — ABNORMAL HIGH (ref 0.61–1.24)
GFR calc Af Amer: 28 mL/min — ABNORMAL LOW (ref >60)
GFR calc non Af Amer: 24 mL/min — ABNORMAL LOW (ref >60)
Glucose, Bld: 108 mg/dL — ABNORMAL HIGH (ref 65–99)
Potassium: 3.6 mmol/L (ref 3.5–5.1)
Sodium: 134 mmol/L — ABNORMAL LOW (ref 135–145)

## 2017-12-11 LAB — GLUCOSE, CAPILLARY
GLUCOSE-CAPILLARY: 142 mg/dL — AB (ref 65–99)
Glucose-Capillary: 69 mg/dL (ref 65–99)
Glucose-Capillary: 99 mg/dL (ref 65–99)
Glucose-Capillary: 115 mg/dL — ABNORMAL HIGH (ref 65–99)
Glucose-Capillary: 117 mg/dL — ABNORMAL HIGH (ref 65–99)

## 2017-12-11 LAB — PROTIME-INR
INR: 2.23
Prothrombin Time: 24.5 s — ABNORMAL HIGH (ref 11.4–15.2)

## 2017-12-11 MED ORDER — METOPROLOL TARTRATE 5 MG/5ML IV SOLN
2.5000 mg | Freq: Once | INTRAVENOUS | Status: AC
  Administered 2017-12-11: 2.5 mg via INTRAVENOUS
  Filled 2017-12-11: qty 5

## 2017-12-11 MED ORDER — METOPROLOL TARTRATE 12.5 MG HALF TABLET
12.5000 mg | ORAL_TABLET | Freq: Two times a day (BID) | ORAL | Status: DC
  Administered 2017-12-11 – 2017-12-13 (×6): 12.5 mg via ORAL
  Filled 2017-12-11 (×7): qty 1

## 2017-12-11 MED ORDER — WARFARIN SODIUM 2.5 MG PO TABS: 2.5000 mg | ORAL_TABLET | Freq: Once | ORAL | Status: DC

## 2017-12-11 NOTE — Progress Notes (Signed)
ANTICOAGULATION CONSULT NOTE - Follow Up Consult  Pharmacy Consult for warfarin Indication: atrial fibrillation  No Known Allergies  Patient Measurements: Height: 5\' 5"  (165.1 cm) Weight: 132 lb 15 oz (60.3 kg) IBW/kg (Calculated) : 61.5  Assessment: 57 y/o male with ESRD on HD on warfarin 2.5mg  PO daily PTA for AFib, bioprosthetic MV. CHADsVASC = 3. Admit INR 1.92. INR therapeutic at 2.23 but going down. Missed dose yesterday as was on the HD unit for most of the day. Hgb 10.6, plts wnl.  Goal of Therapy:  INR 2-3 Monitor platelets by anticoagulation protocol: Yes   Plan:  Give Coumadin 2.5mg  PO x 1 tonight Monitor daily INR, CBC, s/s of bleed  Elenor Quinones, PharmD, [facility] Clinical Pharmacist Pager [phone] 12/11/2017 7:36 AM

## 2017-12-11 NOTE — Care Management Note (Signed)
Case Management Note  Patient Details  Name: Michael [name] MRN: [id number] Date of Birth: March 10, 1961  Subjective/Objective:    From home, with hx of ESRD and gout , Diaylsis on MWF, presents with sepsis, hypotensive, aifb, chronic pain, chronic resp failure, HLD, liver cirrhosis.  Patient is back from HD, today, he is very grogy,  He states he has a walker at home.  He is agreeable to have [facility] follow him in the Community.  He is also established with [facility].  Jane with [facility] at [facility] clinic will schedule an apt for follow up for patient , when we get closer to a dc date.  HRI with [facility].     1/23 [place] [facility]- Patient conts with pl effusion, he is on coumadin, MD does not want to do thoracentesis because will need to reverse the coumadin, will try to get fluid off thru HD.  Patient had HD this am , then had cxr.   NCM spoke to patient about [place] with [facility] when he goes home and also following up with [facility] and that he is established with [facility], which he will need a follow up apt scheduled before dc.                             Action/Plan: DC home with [facility] services with [facility] (HRI) at time of dc.  [facility] also following.   Expected Discharge Date:  12/05/17               Expected Discharge Plan:  [place]  In-House Referral:     Discharge planning Services  CM Consult  Post Acute Care Choice:  Home Health Choice offered to:  Patient  DME Arranged:    DME Agency:     HH Arranged:  RN, PT, OT HH Agency:  [place]  Status of Service:  Completed, signed off  If discussed at [place] of Stay Meetings, dates discussed:    Additional Comments:  [name], RN 12/11/2017, 2:58 PM

## 2017-12-11 NOTE — Progress Notes (Signed)
Pt currently in A fib HR 120-130's. Pt asymptomatic. Vitals WNL. Lamar Blinks, NP notified. Will continue to monitor.

## 2017-12-11 NOTE — Progress Notes (Signed)
[facility] PROGRESS NOTE  Michael [name] OIN:[id number] DOB: 01/14/1961 DOA: 11/30/2017  PCP: Charlott Rakes, MD  Brief History/Interval Summary: 57 year old male with a past medical history of end-stage renal disease on hematoma, history of hypertension on midodrine, chronic pain, atrial fibrillation on warfarin, liver cirrhosis, coronary artery disease status post CABG, chronic respiratory failure, type 2 diabetes, history of gout presented with shortness of breath.  There was some concern for sepsis at the time of admission.  He was also noted to have pleural effusion.  Patient was hospitalized for further management.  Consultants: Nephrology.  Pulmonology.  Procedures: None  Antibiotics: Patient has completed treatment with antibiotics  Subjective/Interval History: Patient states that he feels quite well.  Denies any chest pain.  No shortness of breath.  Heart rate has been elevated through the night.  Improved after he was given metoprolol this morning.    ROS: Pain involving his feet has improved.  Objective:  Vital Signs  Vitals:   12/10/17 2326 12/11/17 0402 12/11/17 0500 12/11/17 0736  BP:  106/78  116/83  Pulse:    (!) 127  Resp:    19  Temp: (!) 97.2 F (36.2 C) 97.9 F (36.6 C)  97.8 F (36.6 C)  TempSrc: Oral Oral  Oral  SpO2:    97%  Weight:   60.3 kg (132 lb 15 oz)   Height:        Intake/Output Summary (Last 24 hours) at 12/11/2017 0958 Last data filed at 12/11/2017 0900 Gross per 24 hour  Intake 964 ml  Output 1412 ml  Net -448 ml   Filed Weights   12/10/17 0740 12/10/17 1243 12/11/17 0500  Weight: 59.9 kg (132 lb 0.9 oz) 58.5 kg (128 lb 15.5 oz) 60.3 kg (132 lb 15 oz)    General appearance: Awake alert.  In no distress. Resp: Seems to have improved air entry on the right.  Still has dullness to percussion.  No definite crackles or wheezing.    Cardio: S1-S2 is tachycardic and irregular.  No S3-S4.  No rubs murmurs or bruit.   GI:  Abdomen remains soft nontender.   Extremities: No swelling or erythema noted both feet Neurologic: No focal deficits  Lab Results:  Data Reviewed: I have personally reviewed following labs and imaging studies  CBC: Recent Labs  Lab 12/05/17 0657 12/06/17 0343 12/07/17 0753 12/09/17 0430 12/10/17 1015  WBC 5.9 6.6 7.0 6.7 6.2  NEUTROABS  --  3.3  --   --   --   HGB 12.4* 10.6* 10.6* 10.4* 10.6*  HCT 35.4* 31.5* 31.0* 30.9* 31.9*  MCV 76.8* 77.8* 77.1* 77.1* 76.7*  PLT 116* 132* 189 188 709    Basic Metabolic Panel: Recent Labs  Lab 12/05/17 0657 12/06/17 0343 12/07/17 0753 12/09/17 0430 12/10/17 1020 12/11/17 0611  NA 131* 135 133* 134* 133* 134*  K 4.6 4.4 4.5 4.0 3.7 3.6  CL 97* 99* 98* 98* 97* 98*  CO2 23 27 27 27 25 26   GLUCOSE 95 85 76 93 81 108*  BUN 6 7 16 19 14 11   CREATININE 1.90* 2.15* 3.18* 3.43* 2.95* 2.73*  CALCIUM 8.3* 8.6* 8.5* 8.4* 8.5* 8.7*  PHOS 1.8*  --  2.3* 3.4 3.7  --     GFR: Estimated Creatinine Clearance: 25.2 mL/min (A) (by C-G formula based on SCr of 2.73 mg/dL (H)).  Liver Function Tests: Recent Labs  Lab 12/05/17 0657 12/06/17 0343 12/07/17 0753 12/09/17 0430 12/10/17 1020  AST  --  27  --   --   --   ALT  --  13*  --   --   --   ALKPHOS  --  174*  --   --   --   BILITOT  --  2.0*  --   --   --   PROT  --  7.5  --   --   --   ALBUMIN 2.8* 2.6* 2.4* 2.4* 2.5*    Coagulation Profile: Recent Labs  Lab 12/07/17 0351 12/08/17 0600 12/09/17 0500 12/10/17 0419 12/11/17 0312  INR 2.94 3.07 2.91 2.93 2.23    BNP (last 3 results) Recent Labs    03/16/17 1401  PROBNP 6,530*   CBG: Recent Labs  Lab 12/10/17 0727 12/10/17 1320 12/10/17 1604 12/10/17 2109 12/11/17 0734  GLUCAP 95 81 111* 96 142*     Recent Results (from the past 240 hour(s))  MRSA PCR Screening     Status: None   Collection Time: 12/02/17  4:17 AM  Result Value Ref Range Status   MRSA by PCR NEGATIVE NEGATIVE Final    Comment:        The  GeneXpert MRSA Assay (FDA approved for NASAL specimens only), is one component of a comprehensive MRSA colonization surveillance program. It is not intended to diagnose MRSA infection nor to guide or monitor treatment for MRSA infections.       Radiology Studies: No results found.   Medications:  Scheduled: . allopurinol  150 mg Oral Daily  . atorvastatin  20 mg Oral Daily  . benzonatate  200 mg Oral TID  . darbepoetin (ARANESP) injection - DIALYSIS  100 mcg Intravenous Q Wed-HD  . digoxin  0.0625 mg Oral QODAY  . docusate sodium  100 mg Oral BID  . fluticasone  2 spray Each Nare Daily  . insulin aspart  0-9 Units Subcutaneous TID WC  . metoprolol tartrate  12.5 mg Oral BID  . midodrine  10 mg Oral TID WC  . multivitamin  1 tablet Oral QHS  . pantoprazole  40 mg Oral Daily  . sodium chloride flush  3 mL Intravenous Q12H  . sodium chloride flush  3 mL Intravenous Q12H  . warfarin  2.5 mg Oral ONCE-1800  . Warfarin - Pharmacist Dosing Inpatient   Does not apply q1800   Continuous: . sodium chloride     LGX:QJJHER chloride, acetaminophen **OR** acetaminophen, acetaminophen-codeine, diclofenac sodium, HYDROcodone-homatropine, hydrocortisone cream, hydrOXYzine, oxyCODONE, sodium chloride flush, tiZANidine  Assessment/Plan:  Principal Problem:   SIRS (systemic inflammatory response syndrome) (HCC) Active Problems:   Atrial fibrillation with RVR (HCC)   Insulin dependent diabetes mellitus (HCC)   ESRD (end stage renal disease) on dialysis (HCC)   Chest pain   CAD (coronary artery disease)   Fluid overload   ESRD (end stage renal disease) (HCC)    Sepsis Respiratory viral panel was positive for coronavirus.  He was initially placed on vancomycin and ceftriaxone.  Currently off of antibiotics.  Initially had elevated lactic acid levels as well.  Currently he does not have sepsis physiology.  Right-sided pleural effusion Etiology for this effusion is not entirely  clear.  Patient is getting frequent dialysis to see if that would decrease the effusion.  Pulmonology was consulted and they are following.  Patient has been on warfarin which has been held by pulmonology in case patient needs to undergo thoracentesis.  Chest x-ray continues to show right-sided effusion with perhaps slight improvement on 1/23.  Chest x-ray  repeated today and does show improvement in pleural effusion.  So perhaps we may be able to hold off on thoracentesis.  However we will continue to monitor this closely.  End-stage renal disease on hemodialysis on Monday Wednesday Friday Nephrology is following.  Patient being dialyzed frequently.  Patient also has chronic hypoension and is on midodrine. Next dialysis planned for Saturday..  Chronic Atrial fibrillation Heart rate noted to be elevated after dialysis yesterday.  Could be because of frequent dialysis and perhaps excess volume has been pulled off.  Patient was given a dose of metoprolol intravenously this morning.  Since his blood pressure is stable we will give him low-dose metoprolol orally.  Continue to monitor on telemetry monitoring.  Warfarin is on hold in case patient needs to undergo thoracentesis.  On digoxin as well.  History of coronary artery disease status post CABG/mitral valve repair Stable.  Continue home medications.  Not on usual cardiac medications due to hypotension.  Anemia of chronic kidney disease Hemoglobin is stable.  No evidence for overt bleeding.  Chronic pain Continue home medications.  Chronic respiratory failure on home oxygen Stable.  History of liver cirrhosis, idiopathic with thrombocytopenia Continue lactulose.  Currently not encephalopathic.  History of gout Continue home medications.  Foot pain has improved.  Diabetes mellitus type 2 Not on medications.  Continue SSI.   DVT Prophylaxis: On warfarin    Code Status: Full code Family Communication: Discussed with the  patient Disposition Plan: Management as outlined above.  Okay for transfer to telemetry.  Await improvement in pleural effusion.    LOS: 10 days   [facility] Pager [phone] 12/11/2017, 9:58 AM  If 7PM-7AM, please contact night-coverage at [email or web address], password [facility]

## 2017-12-11 NOTE — Progress Notes (Signed)
PULMONARY / CRITICAL CARE MEDICINE   Name: Michael [name] MRN: [id number] DOB: 08-03-1961    ADMISSION DATE:  11/30/2017 CONSULTATION DATE: 12/07/2017   CHIEF COMPLAINT:  Dyspnea.  HISTORY OF PRESENT ILLNESS:  This is a 57 year old with end-stage renal disease on hemodialysis, a history of cirrhosis, history of mitral valve replacement repair and atrial fibrillation with chronic systolic dysfunction, diabetes and chronic O2 dependent lung disease who was initially admitted on 1/8 complaining of dyspnea.  He underwent 2 episodes of hemodialysis with fluid removal and was discharged home.  He returned again complaining of dyspnea on 1/15 at which time he had a temperature of 38.9 degrees.  Blood cultures were obtained which so far have shown no growth.  He was started empirically on a combination of vancomycin and cefepime.  I was asked to see him today for a persistent right pleural effusion.  A CT scan of the chest shows a moderate to large pleural effusion on the right with compressive atelectasis.  I do not appreciate ascites on the portion of the abdomen visualized on the chest CT.  There is no overt underlying mass and the fluid appears to be simple nonloculated fluid.  The patient reports that his dyspnea is back to baseline.  He denies any cough.   VITAL SIGNS: BP 114/84   Pulse (!) 111   Temp 97.8 F (36.6 C) (Oral)   Resp 19   Ht 5\' 5"  (1.651 m)   Wt 60.3 kg (132 lb 15 oz)   SpO2 97%   BMI 22.12 kg/m   HEMODYNAMICS:    VENTILATOR SETTINGS:    INTAKE / OUTPUT: I/O last 3 completed shifts: In: 3382 [P.O.:1020; I.V.:3] Out: 1412 [Other:1412]  PHYSICAL EXAMINATION: General: 57 year old no acute distress at rest on room air HEENT: [place]/AT, PERRL, no appreciable JVD Neuro: Alert, oriented, non-focal CV: IRIR, no MRG PULM: Diminished on R side, otherwise clear R apex and L side.  GI: Soft, mildly distended, non-tender Extremities: warm/dry, trace edema  Skin: no rashes  or lesions   Recent Labs  Lab 12/09/17 0430 12/10/17 1020 12/11/17 0611  NA 134* 133* 134*  K 4.0 3.7 3.6  CL 98* 97* 98*  CO2 27 25 26   BUN 19 14 11   CREATININE 3.43* 2.95* 2.73*  GLUCOSE 93 81 108*    Electrolytes Recent Labs  Lab 12/07/17 0753 12/09/17 0430 12/10/17 1020 12/11/17 0611  CALCIUM 8.5* 8.4* 8.5* 8.7*  PHOS 2.3* 3.4 3.7  --     CBC Recent Labs  Lab 12/07/17 0753 12/09/17 0430 12/10/17 1015  WBC 7.0 6.7 6.2  HGB 10.6* 10.4* 10.6*  HCT 31.0* 30.9* 31.9*  PLT 189 188 172    Coag's Recent Labs  Lab 12/09/17 0500 12/10/17 0419 12/11/17 0312  INR 2.91 2.93 2.23    Sepsis Markers No results for input(s): LATICACIDVEN, PROCALCITON, O2SATVEN in the last 168 hours.  ABG No results for input(s): PHART, PCO2ART, PO2ART in the last 168 hours.  Liver Enzymes Recent Labs  Lab 12/06/17 0343 12/07/17 0753 12/09/17 0430 12/10/17 1020  AST 27  --   --   --   ALT 13*  --   --   --   ALKPHOS 174*  --   --   --   BILITOT 2.0*  --   --   --   ALBUMIN 2.6* 2.4* 2.4* 2.5*    Cardiac Enzymes No results for input(s): TROPONINI, PROBNP in the last 168 hours.  Glucose Recent Labs  Lab 12/09/17 2001 12/10/17 0727 12/10/17 1320 12/10/17 1604 12/10/17 2109 12/11/17 0734  GLUCAP 170* 95 81 111* 96 142*    Imaging Dg Chest 2 View  Result Date: 12/11/2017 CLINICAL DATA:  Pleural effusion EXAM: CHEST  2 VIEW COMPARISON:  December 09, 2017 FINDINGS: Central catheter tip is in the superior vena cava. No pneumothorax. There is a persistent sizable pleural effusion on the right with consolidation in portions of the right middle and lower lobes. The left lung is clear. Heart is mildly enlarged with pulmonary vascularity within normal limits. No adenopathy. Patient is status post coronary artery bypass grafting. No evident bone lesions. There is aortic atherosclerosis. IMPRESSION: Persistent sizable pleural effusion on the right with consolidation in  portions of the right middle and lower lobes, largely due to atelectatic change. Left lung clear. Stable cardiac prominence. Central catheter tip in superior vena cava. No pneumothorax. There is aortic atherosclerosis. Aortic Atherosclerosis (ICD10-I70.0). Electronically Signed   By: Lowella Grip III M.D.   On: 12/11/2017 10:45      DISCUSSION:  Large pleural effusion: Right.  His respiratory status is at baseline, however, the effusion has hardly improved despite several dialysis sessions. Ideally we need to drain this via thoracentesis, however, despite stopping warfarin 2 ago his INR remains therapeutic.    Plan:  - Needs thoracentesis. Will discuss with attending whether to reverse coagulopathy and proceed today, or give more time to correct on its own.  - Continued volume removal with HD as effusion has somewhat improved.   Atrial fib, DM, chronic pain - per primary  Georgann Housekeeper, AGACNP-BC [facility] Pulmonology/Critical Care  Pager [phone] or [phone]  12/11/2017 12:21 PM

## 2017-12-11 NOTE — Progress Notes (Signed)
Pt arrived on unit. Pt oriented to room. Telemetry placed and pt made comfortable. Will continue to assess.

## 2017-12-11 NOTE — Progress Notes (Signed)
[facility] Progress Note   Subjective:  Breathing much better overall, minimal cough  Objective Vitals:   12/11/17 0402 12/11/17 0500 12/11/17 0736 12/11/17 1013  BP: 106/78  116/83 114/84  Pulse:   (!) 127 (!) 111  Resp:   19   Temp: 97.9 F (36.6 C)  97.8 F (36.6 C)   TempSrc: Oral  Oral   SpO2:   97%   Weight:  60.3 kg (132 lb 15 oz)    Height:       Physical Exam General:NAD, chronically ill appearing, thin male Heart:tachycardia, irregular rhythm, 2/6 systolic murmur Lungs: mostly CTAB, decreased BS on R Abdomen:soft, NT, ND Extremities:no edema, +ischemic changes b/l Dialysis Access: LU AVF +t, R IJ TDC in use  Filed Weights   12/10/17 0740 12/10/17 1243 12/11/17 0500  Weight: 59.9 kg (132 lb 0.9 oz) 58.5 kg (128 lb 15.5 oz) 60.3 kg (132 lb 15 oz)    Intake/Output Summary (Last 24 hours) at 12/11/2017 1154 Last data filed at 12/11/2017 0900 Gross per 24 hour  Intake 964 ml  Output 1412 ml  Net -448 ml    Additional Objective Labs: Basic Metabolic Panel: Recent Labs  Lab 12/07/17 0753 12/09/17 0430 12/10/17 1020 12/11/17 0611  NA 133* 134* 133* 134*  K 4.5 4.0 3.7 3.6  CL 98* 98* 97* 98*  CO2 27 27 25 26   GLUCOSE 76 93 81 108*  BUN 16 19 14 11   CREATININE 3.18* 3.43* 2.95* 2.73*  CALCIUM 8.5* 8.4* 8.5* 8.7*  PHOS 2.3* 3.4 3.7  --    Liver Function Tests: Recent Labs  Lab 12/06/17 0343 12/07/17 0753 12/09/17 0430 12/10/17 1020  AST 27  --   --   --   ALT 13*  --   --   --   ALKPHOS 174*  --   --   --   BILITOT 2.0*  --   --   --   PROT 7.5  --   --   --   ALBUMIN 2.6* 2.4* 2.4* 2.5*   CBC: Recent Labs  Lab 12/05/17 0657 12/06/17 0343 12/07/17 0753 12/09/17 0430 12/10/17 1015  WBC 5.9 6.6 7.0 6.7 6.2  NEUTROABS  --  3.3  --   --   --   HGB 12.4* 10.6* 10.6* 10.4* 10.6*  HCT 35.4* 31.5* 31.0* 30.9* 31.9*  MCV 76.8* 77.8* 77.1* 77.1* 76.7*  PLT 116* 132* 189 188 172   CBG: Recent Labs  Lab 12/10/17 0727  12/10/17 1320 12/10/17 1604 12/10/17 2109 12/11/17 0734  GLUCAP 95 81 111* 96 142*    Lab Results  Component Value Date   INR 2.23 12/11/2017   INR 2.93 12/10/2017   INR 2.91 12/09/2017   Studies/Results: Dg Chest 2 View  Result Date: 12/11/2017 CLINICAL DATA:  Pleural effusion EXAM: CHEST  2 VIEW COMPARISON:  December 09, 2017 FINDINGS: Central catheter tip is in the superior vena cava. No pneumothorax. There is a persistent sizable pleural effusion on the right with consolidation in portions of the right middle and lower lobes. The left lung is clear. Heart is mildly enlarged with pulmonary vascularity within normal limits. No adenopathy. Patient is status post coronary artery bypass grafting. No evident bone lesions. There is aortic atherosclerosis. IMPRESSION: Persistent sizable pleural effusion on the right with consolidation in portions of the right middle and lower lobes, largely due to atelectatic change. Left lung clear. Stable cardiac prominence. Central catheter tip in superior vena cava. No  pneumothorax. There is aortic atherosclerosis. Aortic Atherosclerosis (ICD10-I70.0). Electronically Signed   By: Lowella Grip III M.D.   On: 12/11/2017 10:45    Medications: . sodium chloride     . allopurinol  150 mg Oral Daily  . atorvastatin  20 mg Oral Daily  . benzonatate  200 mg Oral TID  . darbepoetin (ARANESP) injection - DIALYSIS  100 mcg Intravenous Q Wed-HD  . digoxin  0.0625 mg Oral QODAY  . docusate sodium  100 mg Oral BID  . fluticasone  2 spray Each Nare Daily  . insulin aspart  0-9 Units Subcutaneous TID WC  . metoprolol tartrate  12.5 mg Oral BID  . midodrine  10 mg Oral TID WC  . multivitamin  1 tablet Oral QHS  . pantoprazole  40 mg Oral Daily  . sodium chloride flush  3 mL Intravenous Q12H  . sodium chloride flush  3 mL Intravenous Q12H  . Warfarin - Pharmacist Dosing Inpatient   Does not apply q1800    Dialysis Orders: [facility] 4h  300/800  61 kg  (getting down to 63kg at center)   3K/2.25 bath  TDC R / LUA BVT+bruit (2nd stage 11/03/17 Dr Donzetta Matters)  Hep none - Mircera 45mcg IV q 2 weeks (last given 11/18/17) - No VDRA   Assessment:  1. Dyspnea - vol overload/ pulm edema + pleural effusion. Former resolved, latter not 2. SIRS - resolved, +coronavirus 3. R pleural effusion - mod large by CXR today, not symptomatic. 4. A fib - rate improved. On dig. Warfarin held. Per pharm/primary 5. ESRD - HD MWFSat 6. Anemia of CKD- Hgb 10.6. 100 mcg Aranesp given 1/16. No esa 7. Secondary hyperparathyroidism - Ca and P in goal. No VDRA.  8. Hypotension/vol - chronic / RHF, on midodrine, will lower dry wt 59-60kg 9. Nutrition - Alb 2.5. Renal diet with fluid restrictions. Renavite. Nepro. 10. S/p MVR/TNR/Maze - per primary 11. Severe heart disease/CM/RHF - high risk pt for complications with HD.  12. Chronic Resp Failure - on home O2 13. H/o cirrhosis - per primary 14. DMT2 - diet controlled.   P - HD Sat, lower vol further as tol, have d/w primary  Kelly Splinter MD [employer] pager [phone]   12/11/2017, 11:54 AM

## 2017-12-11 NOTE — Progress Notes (Signed)
Physical Therapy Treatment Patient Details Name: Michael [name] MRN: [id number] DOB: [birth year] Today's Date: 12/11/2017    History of Present Illness Michael [name] is a 57 y.o. male with medical history significant for end-stage renal disease, chronic pain, atrial fibrillation on warfarin, and chronic thrombocytopenia, now presenting to the emergency department from his dialysis center for evaluation of chest pain and shortness of breath.    PT Comments    Pt in bathroom on arrival. Agreeable to ambulation in hallway. Pt required supervision for bed mobility and transfers. Min guard assist for ambulation 200 feet. Pt returned to bed at end of session. Goals re-assessed and remain appropriate. PT to continue per POC.    Follow Up Recommendations  No PT follow up     Equipment Recommendations  Rolling walker with 5" wheels    Recommendations for Other Services       Precautions / Restrictions Precautions Precautions: Fall Restrictions Weight Bearing Restrictions: No    Mobility  Bed Mobility         Supine to sit: Supervision Sit to supine: Supervision   General bed mobility comments: supervision for safety/lines  Transfers   Equipment used: Ambulation equipment used   Sit to Stand: Supervision         General transfer comment: supervision for safety  Ambulation/Gait Ambulation/Gait assistance: Min guard Ambulation Distance (Feet): 200 Feet Assistive device: Rolling walker (2 wheeled) Gait Pattern/deviations: Step-through pattern;Decreased stride length Gait velocity: decreased Gait velocity interpretation: Below normal speed for age/gender General Gait Details: steady gait   Stairs            Wheelchair Mobility    Modified Rankin (Stroke Patients Only)       Balance   Sitting-balance support: No upper extremity supported;Feet supported Sitting balance-Leahy Scale: Good     Standing balance support: Bilateral upper extremity  supported;During functional activity Standing balance-Leahy Scale: Fair Standing balance comment: RW for ambulation                            Cognition Arousal/Alertness: Awake/alert Behavior During Therapy: Flat affect Overall Cognitive Status: Within Functional Limits for tasks assessed                                        Exercises      General Comments        Pertinent Vitals/Pain Pain Assessment: No/denies pain    Home Living                      Prior Function            PT Goals (current goals can now be found in the care plan section) Acute Rehab PT Goals Patient Stated Goal: to get stronger PT Goal Formulation: With patient Time For Goal Achievement: 12/25/17 Potential to Achieve Goals: Good Progress towards PT goals: Progressing toward goals    Frequency    Min 3X/week      PT Plan Current plan remains appropriate    Co-evaluation              AM-PAC PT "6 Clicks" Daily Activity  Outcome Measure  Difficulty turning over in bed (including adjusting bedclothes, sheets and blankets)?: None Difficulty moving from lying on back to sitting on the side of the bed? : None Difficulty sitting down on and standing  up from a chair with arms (e.g., wheelchair, bedside commode, etc,.)?: A Little Help needed moving to and from a bed to chair (including a wheelchair)?: None Help needed walking in hospital room?: A Little Help needed climbing 3-5 steps with a railing? : A Little 6 Click Score: 21    End of Session Equipment Utilized During Treatment: Gait belt Activity Tolerance: Patient tolerated treatment well Patient left: in bed;with call bell/phone within reach Nurse Communication: Mobility status PT Visit Diagnosis: Muscle weakness (generalized) (M62.81)     Time: 5809-9833 PT Time Calculation (min) (ACUTE ONLY): 17 min  Charges:  $Gait Training: 8-22 mins                    G Codes:       [name], PT  Office # [phone] Pager [phone]    [name] 12/11/2017, 10:18 AM

## 2017-12-12 DIAGNOSIS — N186 End stage renal disease: Secondary | ICD-10-CM

## 2017-12-12 DIAGNOSIS — J90 Pleural effusion, not elsewhere classified: Secondary | ICD-10-CM

## 2017-12-12 DIAGNOSIS — Z992 Dependence on renal dialysis: Secondary | ICD-10-CM

## 2017-12-12 LAB — PROTIME-INR
INR: 1.94
PROTHROMBIN TIME: 22 s — AB (ref 11.4–15.2)

## 2017-12-12 LAB — GLUCOSE, CAPILLARY
GLUCOSE-CAPILLARY: 99 mg/dL (ref 65–99)
Glucose-Capillary: 139 mg/dL — ABNORMAL HIGH (ref 65–99)
Glucose-Capillary: 124 mg/dL — ABNORMAL HIGH (ref 65–99)

## 2017-12-12 MED ORDER — MIDODRINE HCL 5 MG PO TABS
ORAL_TABLET | ORAL | Status: AC
  Filled 2017-12-12: qty 2

## 2017-12-12 NOTE — Progress Notes (Signed)
ANTICOAGULATION CONSULT NOTE - Follow Up Consult  Pharmacy Consult for warfarin Indication: atrial fibrillation  No Known Allergies  Patient Measurements: Height: 5\' 5"  (165.1 cm) Weight: 134 lb 7.7 oz (61 kg) IBW/kg (Calculated) : 61.5  Assessment: 57 y/o male with ESRD on HD on warfarin 2.5mg  PO daily PTA for AFib, bioprosthetic MV. CHADsVASC = 3. Admit INR 1.92. INR subtherapeutic at 1.94 s/p 2 days of holding doses for thoracentesis need.   Goal of Therapy:  INR 2-3 Monitor platelets by anticoagulation protocol: Yes   Plan:  Continue to hold warfarin for procedure F/u restart s/p procedure Monitor daily INR, CBC, s/s of bleed  Bertis Ruddy, PharmD Pharmacy Resident Pager #: [phone] 12/12/2017 9:25 AM

## 2017-12-12 NOTE — Progress Notes (Signed)
[facility] PROGRESS NOTE  Michael [name] Memos CHY:[id number] DOB: 04-25-1961 DOA: 11/30/2017  PCP: Charlott Rakes, MD  Brief History/Interval Summary: 57 year old male with a past medical history of end-stage renal disease on hematoma, history of hypertension on midodrine, chronic pain, atrial fibrillation on warfarin, liver cirrhosis, coronary artery disease status post CABG, chronic respiratory failure, type 2 diabetes, history of gout presented with shortness of breath.  There was some concern for sepsis at the time of admission.  He was also noted to have pleural effusion.  Patient was hospitalized for further management.  Consultants: Nephrology.  Pulmonology.  Procedures: None  Antibiotics: Patient has completed treatment with antibiotics  Subjective/Interval History: Patient feels about the same.  Denies of breath.  Overall he feels improved.     ROS: No nausea or  Objective:  Vital Signs  Vitals:   12/12/17 0930 12/12/17 1000 12/12/17 1030 12/12/17 1100  BP: 110/70 105/80 103/79 103/77  Pulse: 84 84 76 78  Resp:      Temp:      TempSrc:      SpO2:      Weight:      Height:        Intake/Output Summary (Last 24 hours) at 12/12/2017 1120 Last data filed at 12/11/2017 1629 Gross per 24 hour  Intake 472 ml  Output -  Net 472 ml   Filed Weights   12/10/17 1243 12/11/17 0500 12/12/17 0833  Weight: 58.5 kg (128 lb 15.5 oz) 60.3 kg (132 lb 15 oz) 61 kg (134 lb 7.7 oz)    General appearance: Awake alert.  In no distress. Resp: Continues to have dullness to percussion in the right side.  Diminished air entry at that site.  No definite crackles or wheezing.     Cardio: S1-S2 irregular.  Normal rate today. GI: Soft.  Nontender nondistended. Extremities: No swelling or erythema noted both feet Neurologic: No focal deficits  Lab Results:  Data Reviewed: I have personally reviewed following labs and imaging studies  CBC: Recent Labs  Lab 12/06/17 0343  12/07/17 0753 12/09/17 0430 12/10/17 1015  WBC 6.6 7.0 6.7 6.2  NEUTROABS 3.3  --   --   --   HGB 10.6* 10.6* 10.4* 10.6*  HCT 31.5* 31.0* 30.9* 31.9*  MCV 77.8* 77.1* 77.1* 76.7*  PLT 132* 189 188 878    Basic Metabolic Panel: Recent Labs  Lab 12/06/17 0343 12/07/17 0753 12/09/17 0430 12/10/17 1020 12/11/17 0611  NA 135 133* 134* 133* 134*  K 4.4 4.5 4.0 3.7 3.6  CL 99* 98* 98* 97* 98*  CO2 27 27 27 25 26   GLUCOSE 85 76 93 81 108*  BUN 7 16 19 14 11   CREATININE 2.15* 3.18* 3.43* 2.95* 2.73*  CALCIUM 8.6* 8.5* 8.4* 8.5* 8.7*  PHOS  --  2.3* 3.4 3.7  --     GFR: Estimated Creatinine Clearance: 25.4 mL/min (A) (by C-G formula based on SCr of 2.73 mg/dL (H)).  Liver Function Tests: Recent Labs  Lab 12/06/17 0343 12/07/17 0753 12/09/17 0430 12/10/17 1020  AST 27  --   --   --   ALT 13*  --   --   --   ALKPHOS 174*  --   --   --   BILITOT 2.0*  --   --   --   PROT 7.5  --   --   --   ALBUMIN 2.6* 2.4* 2.4* 2.5*    Coagulation Profile: Recent Labs  Lab 12/08/17  0600 12/09/17 0500 12/10/17 0419 12/11/17 0312 12/12/17 0326  INR 3.07 2.91 2.93 2.23 1.94    BNP (last 3 results) Recent Labs    03/16/17 1401  PROBNP 6,530*   CBG: Recent Labs  Lab 12/11/17 1215 12/11/17 1306 12/11/17 1737 12/11/17 2145 12/12/17 0819  GLUCAP 69 99 115* 117* 139*     No results found for this or any previous visit (from the past 240 hour(s)).    Radiology Studies: Dg Chest 2 View  Result Date: 12/11/2017 CLINICAL DATA:  Pleural effusion EXAM: CHEST  2 VIEW COMPARISON:  December 09, 2017 FINDINGS: Central catheter tip is in the superior vena cava. No pneumothorax. There is a persistent sizable pleural effusion on the right with consolidation in portions of the right middle and lower lobes. The left lung is clear. Heart is mildly enlarged with pulmonary vascularity within normal limits. No adenopathy. Patient is status post coronary artery bypass grafting. No evident  bone lesions. There is aortic atherosclerosis. IMPRESSION: Persistent sizable pleural effusion on the right with consolidation in portions of the right middle and lower lobes, largely due to atelectatic change. Left lung clear. Stable cardiac prominence. Central catheter tip in superior vena cava. No pneumothorax. There is aortic atherosclerosis. Aortic Atherosclerosis (ICD10-I70.0). Electronically Signed   By: Lowella Grip III M.D.   On: 12/11/2017 10:45     Medications:  Scheduled: . allopurinol  150 mg Oral Daily  . atorvastatin  20 mg Oral Daily  . benzonatate  200 mg Oral TID  . darbepoetin (ARANESP) injection - DIALYSIS  100 mcg Intravenous Q Wed-HD  . digoxin  0.0625 mg Oral QODAY  . docusate sodium  100 mg Oral BID  . fluticasone  2 spray Each Nare Daily  . insulin aspart  0-9 Units Subcutaneous TID WC  . metoprolol tartrate  12.5 mg Oral BID  . midodrine  10 mg Oral TID WC  . multivitamin  1 tablet Oral QHS  . pantoprazole  40 mg Oral Daily  . sodium chloride flush  3 mL Intravenous Q12H  . sodium chloride flush  3 mL Intravenous Q12H  . Warfarin - Pharmacist Dosing Inpatient   Does not apply q1800   Continuous: . sodium chloride     UUV:OZDGUY chloride, acetaminophen **OR** acetaminophen, acetaminophen-codeine, diclofenac sodium, HYDROcodone-homatropine, hydrocortisone cream, hydrOXYzine, oxyCODONE, sodium chloride flush, tiZANidine  Assessment/Plan:  Principal Problem:   SIRS (systemic inflammatory response syndrome) (HCC) Active Problems:   Atrial fibrillation with RVR (HCC)   Insulin dependent diabetes mellitus (HCC)   ESRD (end stage renal disease) on dialysis (HCC)   Chest pain   CAD (coronary artery disease)   Fluid overload   ESRD (end stage renal disease) (HCC)     Right-sided pleural effusion Etiology for this effusion is not entirely clear.  Patient was getting frequent dialysis to see if that would decrease the effusion. The effusion did appear to  reduce in size but still remains significantly large.  Pulmonology has been following and they do mention a plan to do thoracentesis.  However patient has been on warfarin which has been held for possible procedure.  INR is 1.9 today.  Await further pulmonology input.  From a symptom standpoint patient appears to have improved.  He remains stable and off of oxygen.   Sepsis Respiratory viral panel was positive for coronavirus.  He was initially placed on vancomycin and ceftriaxone.  Currently off of antibiotics.  Initially had elevated lactic acid levels as well.  Currently he  does not have sepsis physiology.  End-stage renal disease on hemodialysis on Monday Wednesday Friday Nephrology is following.  Patient was being dialyzed frequently.  Patient also has chronic hypoension and is on midodrine.  Plan for dialysis today.  Chronic Atrial fibrillation Heart rate was noted to be elevated after dialysis on 1/25.  Could be because of frequent dialysis and perhaps excess volume has been pulled off.  Heart rate has improved with the initiation of low-dose metoprolol.  Continue to monitor on telemetry monitoring.  Warfarin is on hold in case patient needs to undergo thoracentesis.  On digoxin as well.  History of coronary artery disease status post CABG/mitral valve repair Stable.    Anemia of chronic kidney disease Hemoglobin is stable.  No evidence for overt bleeding.  Chronic pain Continue home medications.  Chronic respiratory failure on home oxygen Stable.  History of liver cirrhosis, idiopathic with thrombocytopenia Continue lactulose.  Currently not encephalopathic.  History of gout Continue home medications.  Foot pain has improved.  Diabetes mellitus type 2 Not on medications.  Continue SSI.  CBGs are reasonably well controlled.   DVT Prophylaxis: On warfarin    Code Status: Full code Family Communication: Discussed with the patient Disposition Plan: Management as outlined  above.  Await pulmonology input.    LOS: 11 days   [facility] Pager [phone] 12/12/2017, 11:20 AM  If 7PM-7AM, please contact night-coverage at [email or web address], password [facility]

## 2017-12-12 NOTE — Progress Notes (Signed)
[facility] Progress Note   Subjective:  Doing well, down to 58.5kg today after HD, stable BP's.   Objective Vitals:   12/12/17 1200 12/12/17 1233 12/12/17 1407 12/12/17 1437  BP: 96/66 120/66 106/70 111/75  Pulse: 100 88 (!) 113 95  Resp:  18  16  Temp:  98 F (36.7 C)  98.8 F (37.1 C)  TempSrc:  Oral  Oral  SpO2:  100%  97%  Weight:  58.5 kg (128 lb 15.5 oz)    Height:       Physical Exam General:NAD, chronically ill appearing, thin male Heart:tachycardia, irregular rhythm, 2/6 systolic murmur Lungs: mostly CTAB, decreased BS on R Abdomen:soft, NT, ND Extremities:no edema, +ischemic changes b/l Dialysis Access: LU AVF +t, R IJ TDC in use  Filed Weights   12/11/17 0500 12/12/17 0833 12/12/17 1233  Weight: 60.3 kg (132 lb 15 oz) 61 kg (134 lb 7.7 oz) 58.5 kg (128 lb 15.5 oz)    Intake/Output Summary (Last 24 hours) at 12/12/2017 2013 Last data filed at 12/12/2017 1836 Gross per 24 hour  Intake 240 ml  Output 2500 ml  Net -2260 ml    Additional Objective Labs: Basic Metabolic Panel: Recent Labs  Lab 12/07/17 0753 12/09/17 0430 12/10/17 1020 12/11/17 0611  NA 133* 134* 133* 134*  K 4.5 4.0 3.7 3.6  CL 98* 98* 97* 98*  CO2 27 27 25 26   GLUCOSE 76 93 81 108*  BUN 16 19 14 11   CREATININE 3.18* 3.43* 2.95* 2.73*  CALCIUM 8.5* 8.4* 8.5* 8.7*  PHOS 2.3* 3.4 3.7  --    Liver Function Tests: Recent Labs  Lab 12/06/17 0343 12/07/17 0753 12/09/17 0430 12/10/17 1020  AST 27  --   --   --   ALT 13*  --   --   --   ALKPHOS 174*  --   --   --   BILITOT 2.0*  --   --   --   PROT 7.5  --   --   --   ALBUMIN 2.6* 2.4* 2.4* 2.5*   CBC: Recent Labs  Lab 12/06/17 0343 12/07/17 0753 12/09/17 0430 12/10/17 1015  WBC 6.6 7.0 6.7 6.2  NEUTROABS 3.3  --   --   --   HGB 10.6* 10.6* 10.4* 10.6*  HCT 31.5* 31.0* 30.9* 31.9*  MCV 77.8* 77.1* 77.1* 76.7*  PLT 132* 189 188 172   CBG: Recent Labs  Lab 12/11/17 1306 12/11/17 1737 12/11/17 2145  12/12/17 0819 12/12/17 1705  GLUCAP 99 115* 117* 139* 124*    Lab Results  Component Value Date   INR 1.94 12/12/2017   INR 2.23 12/11/2017   INR 2.93 12/10/2017   Studies/Results: Dg Chest 2 View  Result Date: 12/11/2017 CLINICAL DATA:  Pleural effusion EXAM: CHEST  2 VIEW COMPARISON:  December 09, 2017 FINDINGS: Central catheter tip is in the superior vena cava. No pneumothorax. There is a persistent sizable pleural effusion on the right with consolidation in portions of the right middle and lower lobes. The left lung is clear. Heart is mildly enlarged with pulmonary vascularity within normal limits. No adenopathy. Patient is status post coronary artery bypass grafting. No evident bone lesions. There is aortic atherosclerosis. IMPRESSION: Persistent sizable pleural effusion on the right with consolidation in portions of the right middle and lower lobes, largely due to atelectatic change. Left lung clear. Stable cardiac prominence. Central catheter tip in superior vena cava. No pneumothorax. There is aortic atherosclerosis. Aortic  Atherosclerosis (ICD10-I70.0). Electronically Signed   By: Lowella Grip III M.D.   On: 12/11/2017 10:45    Medications: . sodium chloride     . allopurinol  150 mg Oral Daily  . atorvastatin  20 mg Oral Daily  . benzonatate  200 mg Oral TID  . darbepoetin (ARANESP) injection - DIALYSIS  100 mcg Intravenous Q Wed-HD  . digoxin  0.0625 mg Oral QODAY  . docusate sodium  100 mg Oral BID  . fluticasone  2 spray Each Nare Daily  . insulin aspart  0-9 Units Subcutaneous TID WC  . metoprolol tartrate  12.5 mg Oral BID  . midodrine  10 mg Oral TID WC  . multivitamin  1 tablet Oral QHS  . pantoprazole  40 mg Oral Daily  . sodium chloride flush  3 mL Intravenous Q12H  . sodium chloride flush  3 mL Intravenous Q12H    Dialysis Orders: [facility] 4h  300/800  61 kg (getting down to 63kg at center)   3K/2.25 bath  TDC R / LUA BVT+bruit (2nd stage 11/03/17 Dr  Donzetta Matters)  Hep none - Mircera 43mcg IV q 2 weeks (last given 11/18/17) - No VDRA   Assessment:  1. Dyspnea - vol overload/ pulm edema + pleural effusion. Former resolved, latter not. For thoracentesis per CCM.   2. SIRS - resolved, +coronavirus 3. R pleural effusion - mod large by CXR today, not symptomatic. 4. A fib - rate improved. On dig. Warfarin held. Per pharm/primary 5. ESRD - HD MWFSat 6. Anemia of CKD- Hgb 10.6. 100 mcg Aranesp given 1/16. No esa 7. Secondary hyperparathyroidism - Ca and P in goal. No VDRA.  8. Hypotension/vol - chronic / RHF, on midodrine, cont to lower dry wt as tol 9. Nutrition - Alb 2.5. Renal diet with fluid restrictions. Renavite. Nepro. 10. S/p MVR/TNR/Maze - per primary 11. Severe heart disease/CM/RHF .  12. Chronic Resp Failure - on home O2 13. H/o cirrhosis - per primary 14. DMT2 - diet controlled.  15.  L arm AVF - used again today successfully , will consider TDC removal tomorrow  P - HD today  Kelly Splinter MD [place] pager [phone]   12/12/2017, 8:13 PM

## 2017-12-13 ENCOUNTER — Inpatient Hospital Stay (HOSPITAL_COMMUNITY): Payer: Medicare Other

## 2017-12-13 DIAGNOSIS — I481 Persistent atrial fibrillation: Secondary | ICD-10-CM

## 2017-12-13 LAB — BODY FLUID CELL COUNT WITH DIFFERENTIAL
EOS FL: 51 %
Lymphs, Fluid: 33 %
MONOCYTE-MACROPHAGE-SEROUS FLUID: 16 % — AB (ref 50–90)
Neutrophil Count, Fluid: 0 % (ref 0–25)
Total Nucleated Cell Count, Fluid: 576 cu mm (ref 0–1000)

## 2017-12-13 LAB — GRAM STAIN

## 2017-12-13 LAB — GLUCOSE, PLEURAL OR PERITONEAL FLUID: Glucose, Fluid: 108 mg/dL

## 2017-12-13 LAB — HEPATIC FUNCTION PANEL
ALBUMIN: 2.7 g/dL — AB (ref 3.5–5.0)
ALK PHOS: 169 U/L — AB (ref 38–126)
ALT: 12 U/L — ABNORMAL LOW (ref 17–63)
AST: 32 U/L (ref 15–41)
BILIRUBIN TOTAL: 2.1 mg/dL — AB (ref 0.3–1.2)
Bilirubin, Direct: 0.9 mg/dL — ABNORMAL HIGH (ref 0.1–0.5)
Indirect Bilirubin: 1.2 mg/dL — ABNORMAL HIGH (ref 0.3–0.9)
TOTAL PROTEIN: 7.5 g/dL (ref 6.5–8.1)

## 2017-12-13 LAB — LACTATE DEHYDROGENASE: LDH: 224 U/L — ABNORMAL HIGH (ref 98–192)

## 2017-12-13 LAB — LACTATE DEHYDROGENASE, PLEURAL OR PERITONEAL FLUID: LD FL: 187 U/L — AB (ref 3–23)

## 2017-12-13 LAB — PROTIME-INR
INR: 1.54
PROTHROMBIN TIME: 18.3 s — AB (ref 11.4–15.2)

## 2017-12-13 LAB — GLUCOSE, CAPILLARY
GLUCOSE-CAPILLARY: 145 mg/dL — AB (ref 65–99)
Glucose-Capillary: 92 mg/dL (ref 65–99)
Glucose-Capillary: 136 mg/dL — ABNORMAL HIGH (ref 65–99)
Glucose-Capillary: 112 mg/dL — ABNORMAL HIGH (ref 65–99)

## 2017-12-13 LAB — PROTEIN, PLEURAL OR PERITONEAL FLUID: TOTAL PROTEIN, FLUID: 3.7 g/dL

## 2017-12-13 MED ORDER — LIDOCAINE HCL (PF) 1 % IJ SOLN
INTRAMUSCULAR | Status: AC
  Filled 2017-12-13: qty 30

## 2017-12-13 MED ORDER — DARBEPOETIN ALFA 60 MCG/0.3ML IJ SOSY: 60.0000 ug | PREFILLED_SYRINGE | INTRAMUSCULAR | Status: DC

## 2017-12-13 MED ORDER — WARFARIN - PHARMACIST DOSING INPATIENT
Freq: Every day | Status: DC
  Administered 2017-12-14: 17:00:00

## 2017-12-13 MED ORDER — DARBEPOETIN ALFA 100 MCG/0.5ML IJ SOSY: 100.0000 ug | PREFILLED_SYRINGE | INTRAMUSCULAR | Status: DC

## 2017-12-13 MED ORDER — WARFARIN SODIUM 5 MG PO TABS
5.0000 mg | ORAL_TABLET | Freq: Once | ORAL | Status: AC
  Administered 2017-12-13: 5 mg via ORAL
  Filled 2017-12-13: qty 1

## 2017-12-13 NOTE — Progress Notes (Signed)
[facility] PROGRESS NOTE  Michael [name] Memos ION:[id number] DOB: 11/26/1960 DOA: 11/30/2017  PCP: Charlott Rakes, MD  Brief History/Interval Summary: 57 year old male with a past medical history of end-stage renal disease on hematoma, history of hypertension on midodrine, chronic pain, atrial fibrillation on warfarin, liver cirrhosis, coronary artery disease status post CABG, chronic respiratory failure, type 2 diabetes, history of gout presented with shortness of breath.  There was some concern for sepsis at the time of admission.  He was also noted to have pleural effusion.  Patient was hospitalized for further management.  Consultants: Nephrology.  Pulmonology.  Procedures: Thoracentesis.  Hemodialysis.  Antibiotics: Patient has completed treatment with antibiotics  Subjective/Interval History: Patient states that he feels better.  Breathing has improved.  Tolerated his thoracentesis well without any problems.  Denies any chest pain.    ROS: No nausea or vomiting  Objective:  Vital Signs  Vitals:   12/13/17 0609 12/13/17 0916 12/13/17 1000 12/13/17 1028  BP: 116/90 (!) 99/45 (!) 80/60 (!) 152/51  Pulse: 92     Resp: 17     Temp: (!) 97.4 F (36.3 C)     TempSrc: Oral     SpO2: 100%     Weight:      Height:        Intake/Output Summary (Last 24 hours) at 12/13/2017 1336 Last data filed at 12/13/2017 1100 Gross per 24 hour  Intake 440 ml  Output -  Net 440 ml   Filed Weights   12/12/17 0833 12/12/17 1233 12/13/17 0500  Weight: 61 kg (134 lb 7.7 oz) 58.5 kg (128 lb 15.5 oz) 59.9 kg (132 lb 0.9 oz)    General appearance: Awake alert.  In no distress. Resp: Improved aeration to the right side.  Decreased dullness to percussion.  No definite crackles or wheezing.   Cardio: S1-S2 is irregular.  Normal rate. GI: Abdomen soft.  Nontender nondistended.  Bowel sounds are present.  No masses organomegaly Extremities: No Edema Neurologic: No focal deficits  Lab  Results:  Data Reviewed: I have personally reviewed following labs and imaging studies  CBC: Recent Labs  Lab 12/07/17 0753 12/09/17 0430 12/10/17 1015  WBC 7.0 6.7 6.2  HGB 10.6* 10.4* 10.6*  HCT 31.0* 30.9* 31.9*  MCV 77.1* 77.1* 76.7*  PLT 189 188 244    Basic Metabolic Panel: Recent Labs  Lab 12/07/17 0753 12/09/17 0430 12/10/17 1020 12/11/17 0611  NA 133* 134* 133* 134*  K 4.5 4.0 3.7 3.6  CL 98* 98* 97* 98*  CO2 27 27 25 26   GLUCOSE 76 93 81 108*  BUN 16 19 14 11   CREATININE 3.18* 3.43* 2.95* 2.73*  CALCIUM 8.5* 8.4* 8.5* 8.7*  PHOS 2.3* 3.4 3.7  --     GFR: Estimated Creatinine Clearance: 25 mL/min (A) (by C-G formula based on SCr of 2.73 mg/dL (H)).  Liver Function Tests: Recent Labs  Lab 12/07/17 0753 12/09/17 0430 12/10/17 1020  ALBUMIN 2.4* 2.4* 2.5*    Coagulation Profile: Recent Labs  Lab 12/09/17 0500 12/10/17 0419 12/11/17 0312 12/12/17 0326 12/13/17 0239  INR 2.91 2.93 2.23 1.94 1.54    BNP (last 3 results) Recent Labs    03/16/17 1401  PROBNP 6,530*   CBG: Recent Labs  Lab 12/12/17 0819 12/12/17 1705 12/12/17 2232 12/13/17 0750 12/13/17 1208  GLUCAP 139* 124* 99 92 136*     Recent Results (from the past 240 hour(s))  Gram stain     Status: None   Collection  Time: 12/13/17 11:17 AM  Result Value Ref Range Status   Specimen Description PLEURAL RIGHT  Final   Special Requests NONE  Final   Gram Stain   Final    RARE WBC PRESENT,BOTH PMN AND MONONUCLEAR NO ORGANISMS SEEN    Report Status 12/13/2017 FINAL  Final      Radiology Studies: Dg Chest 1 View  Result Date: 12/13/2017 CLINICAL DATA:  Status post right thoracentesis EXAM: CHEST 1 VIEW COMPARISON:  12/11/2017 FINDINGS: Interval decrease right pleural effusion. No pneumothorax. Persistent right base pleural-parenchymal opacity with small right pleural effusion. The cardio pericardial silhouette is enlarged. Right IJ dialysis catheter again noted. The  visualized bony structures of the thorax are intact. IMPRESSION: Decreased right pleural effusion without evidence for pneumothorax. Electronically Signed   By: Misty Stanley M.D.   On: 12/13/2017 10:47   US Thoracentesis Asp Pleural Space W/img Guide  Result Date: 12/13/2017 INDICATION: Shortness of breath. Right-sided pleural effusion. Request diagnostic and therapeutic thoracentesis. EXAM: ULTRASOUND GUIDED RIGHT THORACENTESIS MEDICATIONS: None. COMPLICATIONS: None immediate. Postprocedural chest x-ray negative for pneumothorax. PROCEDURE: An ultrasound guided thoracentesis was thoroughly discussed with the patient and questions answered. The benefits, risks, alternatives and complications were also discussed. The patient understands and wishes to proceed with the procedure. Written consent was obtained. Ultrasound was performed to localize and mark an adequate pocket of fluid in the right chest. The area was then prepped and draped in the normal sterile fashion. 1% Lidocaine was used for local anesthesia. Under ultrasound guidance a Safe-T-Centesis catheter was introduced. Thoracentesis was performed. The catheter was removed and a dressing applied. FINDINGS: A total of approximately 1.1 L of hazy, blood-tinged fluid was removed. Samples were sent to the laboratory as requested by the clinical team. IMPRESSION: Successful ultrasound guided right thoracentesis yielding 1.1 L of pleural fluid. Read by: Ascencion Dike PA-C Electronically Signed   By: Corrie Mckusick D.O.   On: 12/13/2017 10:16     Medications:  Scheduled: . allopurinol  150 mg Oral Daily  . atorvastatin  20 mg Oral Daily  . benzonatate  200 mg Oral TID  . [START ON 12/16/2017] darbepoetin (ARANESP) injection - DIALYSIS  100 mcg Intravenous Q Wed-HD  . digoxin  0.0625 mg Oral QODAY  . docusate sodium  100 mg Oral BID  . fluticasone  2 spray Each Nare Daily  . insulin aspart  0-9 Units Subcutaneous TID WC  . lidocaine (PF)      .  metoprolol tartrate  12.5 mg Oral BID  . midodrine  10 mg Oral TID WC  . multivitamin  1 tablet Oral QHS  . pantoprazole  40 mg Oral Daily  . sodium chloride flush  3 mL Intravenous Q12H  . sodium chloride flush  3 mL Intravenous Q12H   Continuous: . sodium chloride     HQR:FXJOIT chloride, acetaminophen **OR** acetaminophen, acetaminophen-codeine, diclofenac sodium, HYDROcodone-homatropine, hydrocortisone cream, hydrOXYzine, oxyCODONE, sodium chloride flush, tiZANidine  Assessment/Plan:  Principal Problem:   SIRS (systemic inflammatory response syndrome) (HCC) Active Problems:   Atrial fibrillation with RVR (HCC)   Insulin dependent diabetes mellitus (HCC)   ESRD (end stage renal disease) on dialysis (HCC)   Chest pain   CAD (coronary artery disease)   Fluid overload   ESRD (end stage renal disease) (HCC)   Pleural effusion     Right-sided pleural effusion Etiology for this effusion is not entirely clear.  Patient was getting frequent dialysis to see if that would decrease the effusion. The  effusion did appear to reduce in size but still remains significantly large.  Pulmonology was consulted.  Thoracentesis was recommended.  Warfarin was held.  Thoracentesis today.  LDH is noted to be 187.  Total protein is 3.7.  Total protein in the blood was 7.4 few days ago.  LDH in the serum is pending.  These do suggest exudative process.  Fluid was blood-tinged.  Cytology is pending.  Sutures are pending.  Gram stain was unremarkable.  Discussed with pulmonology.  They recommend repeating chest x-ray in a few weeks.  If for pleural effusion continues to reaccumulate patient may have to be referred to pulmonology for outpatient management.  Okay to resume warfarin.  He remains off of oxygen.    Sepsis Respiratory viral panel was positive for coronavirus.  He was initially placed on vancomycin and ceftriaxone.  Currently off of antibiotics.  Initially had elevated lactic acid levels as well.   Currently he does not have sepsis physiology.  End-stage renal disease on hemodialysis on Monday Wednesday Friday Nephrology is following.  Patient was being dialyzed frequently.  Patient also has chronic hypoension and is on midodrine.  Plan is also for the dialysis catheter to be removed tomorrow by vascular surgery.  Chronic Atrial fibrillation Heart rate was noted to be elevated after dialysis on 1/25.  Could be because of frequent dialysis and perhaps excess volume has been pulled off.  Heart rate has improved with the initiation of low-dose metoprolol.  Continue to monitor on telemetry monitoring.  Warfarin was held due to need for procedure.  Can be resumed today.  On digoxin as well.  History of coronary artery disease status post CABG/mitral valve repair Stable.    Anemia of chronic kidney disease Hemoglobin is stable.  No evidence for overt bleeding.  Chronic pain Continue home medications.  Chronic respiratory failure on home oxygen Stable.  History of liver cirrhosis, idiopathic with thrombocytopenia Continue lactulose.  Currently not encephalopathic.  History of gout Continue home medications.  Foot pain has improved.  Diabetes mellitus type 2 Not on medications.  Continue SSI.  CBGs are reasonably well controlled.   DVT Prophylaxis: On warfarin    Code Status: Full code Family Communication: Discussed with the patient Disposition Plan: Management as outlined above.  Anticipate discharge tomorrow after hemodialysis and once HD catheter removed.    LOS: 12 days   [facility] Pager [phone] 12/13/2017, 1:36 PM  If 7PM-7AM, please contact night-coverage at [email or web address], password [facility]

## 2017-12-13 NOTE — Progress Notes (Signed)
[facility] Progress Note   Dialysis Orders: MWF [facility] 4h 300/800 61 kg (getting down to 63kg at center) 3K/2.25 bath TDC R / LUA BVT+bruit (2nd stage 11/03/17 Dr Donzetta Matters) Hep none - Mircera 75mcg IV q 2 weeks (last given 11/18/17) - No VDRA  Assessment:  1. Dyspnea - vol overload/ pulm edema + pleural effusion. Former resolved, latter not. 1.1 L thoracentesis 1/27  2. SIRS - resolved, +coronavirus 3. R pleural effusion .s/p thora 1/27 by radiology-procedure terminated after severe coughing/chest discomfort; CXR showed decrease in right pleural effusion  4. A fib - rate improved. On dig. Warfarin held. Per pharm/primary 5. ESRD - HD MWFSat- next HD Monday K 3.6 Wed - start on 4 K bath - avoid low K with afib hx 6. Anemia of CKD- Hgb 10.6. 100 mcg Aranesp given 1/16.- none last week-not clear why-continue same dose ESA Wed here or at outpatient HD unit 7. Secondary hyperparathyroidism - Ca and P in goal. No VDRA- no binders.  8. Hypotension/vol - chronic / RHF, on midodrine, cont to lower dry wt as tol; net UF 2.5 Saturday with post wt 58.5 - get standing wts- tolerates net UF goals  of 2.5 L- will see how he does after 1 L thoracentesis -keep SBP > 80 9. Nutrition - Alb 2.5. Renal diet with fluid restrictions. Renavite. Nepro. 10. S/p MVR/TNR/Maze- per primary 11. Severe heart disease/CM/RHF . -requiring 4 day a week HD/tirating EDW down 12. Chronic Resp Failure - on home O2 13. H/o cirrhosis - per primary 14. DMT2 - diet controlled.  15.  L arm AVF - used again today successfully- continue at Qb 300 16 gauge needles , will ask [facility] Monday to remove TDC  16. Afib - on dig/MTP- coumadin had been on hold for thoracenesis 17.  Dispo - would like to see if he can be dc'd tomorrow after dialysis and catheter removal. OK w renal service.   Myriam Jacobson, PA-C [facility] Beeper [phone] 12/13/2017,12:29 PM  LOS: 12 days   Pt seen, examined, agree w  assess/plan as above with additions as indicated.  Kelly Splinter MD [facility] pager [phone]    cell [phone] 12/13/2017, 2:02 PM      Subjective:   Wants catheter out - no problems with AVF and HD yesterday  Objective Vitals:   12/13/17 0609 12/13/17 0916 12/13/17 1000 12/13/17 1028  BP: 116/90 (!) 99/45 (!) 80/60 (!) 152/51  Pulse: 92     Resp: 17     Temp: (!) 97.4 F (36.3 C)     TempSrc: Oral     SpO2: 100%     Weight:      Height:       Physical Exam General: alert breathing easily - thin comfortable Heart: irreg 2/6 murmur Lungs: dim right base otherwise clear Abdomen: soft NT Extremities: no LE edema Dialysis Access: left AVF + bruit and right IJ [facility]   Additional Objective Labs: Lab Results  Component Value Date   INR 1.54 12/13/2017   INR 1.94 12/12/2017   INR 2.23 73/42/8768    Basic Metabolic Panel: Recent Labs  Lab 12/07/17 0753 12/09/17 0430 12/10/17 1020 12/11/17 0611  NA 133* 134* 133* 134*  K 4.5 4.0 3.7 3.6  CL 98* 98* 97* 98*  CO2 27 27 25 26   GLUCOSE 76 93 81 108*  BUN 16 19 14 11   CREATININE 3.18* 3.43* 2.95* 2.73*  CALCIUM 8.5* 8.4* 8.5* 8.7*  PHOS  2.3* 3.4 3.7  --    Liver Function Tests: Recent Labs  Lab 12/07/17 0753 12/09/17 0430 12/10/17 1020  ALBUMIN 2.4* 2.4* 2.5*   CBC: Recent Labs  Lab 12/07/17 0753 12/09/17 0430 12/10/17 1015  WBC 7.0 6.7 6.2  HGB 10.6* 10.4* 10.6*  HCT 31.0* 30.9* 31.9*  MCV 77.1* 77.1* 76.7*  PLT 189 188 172   Blood Culture    Component Value Date/Time   SDES BLOOD RIGHT ANTECUBITAL 11/30/2017 1740   SDES BLOOD RIGHT ANTECUBITAL 11/30/2017 1740   SPECREQUEST IN PEDIATRIC BOTTLE Blood Culture adequate volume 11/30/2017 1740   SPECREQUEST IN PEDIATRIC BOTTLE Blood Culture adequate volume 11/30/2017 1740   CULT NO GROWTH 5 DAYS 11/30/2017 1740   CULT NO GROWTH 5 DAYS 11/30/2017 1740   REPTSTATUS 12/05/2017 FINAL 11/30/2017 1740   REPTSTATUS 12/05/2017 FINAL  11/30/2017 1740    Cardiac Enzymes: No results for input(s): CKTOTAL, CKMB, CKMBINDEX, TROPONINI in the last 168 hours. CBG: Recent Labs  Lab 12/12/17 0819 12/12/17 1705 12/12/17 2232 12/13/17 0750 12/13/17 1208  GLUCAP 139* 124* 99 92 136*   Iron Studies: No results for input(s): IRON, TIBC, TRANSFERRIN, FERRITIN in the last 72 hours. Lab Results  Component Value Date   INR 1.54 12/13/2017   INR 1.94 12/12/2017   INR 2.23 12/11/2017   Studies/Results: Dg Chest 1 View  Result Date: 12/13/2017 CLINICAL DATA:  Status post right thoracentesis EXAM: CHEST 1 VIEW COMPARISON:  12/11/2017 FINDINGS: Interval decrease right pleural effusion. No pneumothorax. Persistent right base pleural-parenchymal opacity with small right pleural effusion. The cardio pericardial silhouette is enlarged. Right IJ dialysis catheter again noted. The visualized bony structures of the thorax are intact. IMPRESSION: Decreased right pleural effusion without evidence for pneumothorax. Electronically Signed   By: Misty Stanley M.D.   On: 12/13/2017 10:47   US Thoracentesis Asp Pleural Space W/img Guide  Result Date: 12/13/2017 INDICATION: Shortness of breath. Right-sided pleural effusion. Request diagnostic and therapeutic thoracentesis. EXAM: ULTRASOUND GUIDED RIGHT THORACENTESIS MEDICATIONS: None. COMPLICATIONS: None immediate. Postprocedural chest x-ray negative for pneumothorax. PROCEDURE: An ultrasound guided thoracentesis was thoroughly discussed with the patient and questions answered. The benefits, risks, alternatives and complications were also discussed. The patient understands and wishes to proceed with the procedure. Written consent was obtained. Ultrasound was performed to localize and mark an adequate pocket of fluid in the right chest. The area was then prepped and draped in the normal sterile fashion. 1% Lidocaine was used for local anesthesia. Under ultrasound guidance a Safe-T-Centesis catheter was  introduced. Thoracentesis was performed. The catheter was removed and a dressing applied. FINDINGS: A total of approximately 1.1 L of hazy, blood-tinged fluid was removed. Samples were sent to the laboratory as requested by the clinical team. IMPRESSION: Successful ultrasound guided right thoracentesis yielding 1.1 L of pleural fluid. Read by: Ascencion Dike PA-C Electronically Signed   By: Corrie Mckusick D.O.   On: 12/13/2017 10:16   Medications: . sodium chloride     . allopurinol  150 mg Oral Daily  . atorvastatin  20 mg Oral Daily  . benzonatate  200 mg Oral TID  . [START ON 12/16/2017] darbepoetin (ARANESP) injection - DIALYSIS  100 mcg Intravenous Q Wed-HD  . digoxin  0.0625 mg Oral QODAY  . docusate sodium  100 mg Oral BID  . fluticasone  2 spray Each Nare Daily  . insulin aspart  0-9 Units Subcutaneous TID WC  . lidocaine (PF)      . metoprolol tartrate  12.5  mg Oral BID  . midodrine  10 mg Oral TID WC  . multivitamin  1 tablet Oral QHS  . pantoprazole  40 mg Oral Daily  . sodium chloride flush  3 mL Intravenous Q12H  . sodium chloride flush  3 mL Intravenous Q12H

## 2017-12-13 NOTE — Progress Notes (Signed)
ANTICOAGULATION CONSULT NOTE - Follow Up Consult  Pharmacy Consult for Warfarin Indication: atrial fibrillation and tissue MVR  No Known Allergies  Patient Measurements: Height: 5\' 5"  (165.1 cm) Weight: 132 lb 0.9 oz (59.9 kg) IBW/kg (Calculated) : 61.5 Heparin Dosing Weight:   Vital Signs: Temp: 98.7 F (37.1 C) (01/27 1445) Temp Source: Oral (01/27 1445) BP: 113/74 (01/27 1445) Pulse Rate: 90 (01/27 1445)  Labs: Recent Labs    12/11/17 0312 12/11/17 0611 12/12/17 0326 12/13/17 0239  LABPROT 24.5*  --  22.0* 18.3*  INR 2.23  --  1.94 1.54  CREATININE  --  2.73*  --   --     Estimated Creatinine Clearance: 25 mL/min (A) (by C-G formula based on SCr of 2.73 mg/dL (H)).  Assessment: 57 y/o male with ESRD on HD on warfarin 2.5mg  PO daily PTA for AFib, bioprosthetic MV. CHADsVASC = 3. Admit INR 1.92. Warfarin held 1/24-1/26 for thoracentesis - now done on 1/27. Pharmacy consulted to resume warfarin dosing this evening.  INR today is SUBtherapeutic (1.54 << 1.94, goal of 2-3). CBC from 1/24 showed Hgb 10.6, plts wnl. Noted thoracentesis today with 1.1L of blood-tinged fluid removed. Discussed need to bridge with Heparin with MD who wants to hold off for now.   Goal of Therapy:  INR 2-3 Monitor platelets by anticoagulation protocol: Yes   Plan:  1. Warfarin 5 mg x 1 dose at 1800 today 2. Will continue to monitor for any signs/symptoms of bleeding and will follow up with PT/INR in the a.m.   Thank you for allowing pharmacy to be a part of this patient's care.  Alycia Rossetti, PharmD, BCPS Clinical Pharmacist Pager: [phone] Clinical phone for 12/13/2017 from 7a-3:30p: [phone] If after 3:30p, please call main pharmacy at: [phone] 12/13/2017 3:18 PM

## 2017-12-13 NOTE — Procedures (Addendum)
PROCEDURE SUMMARY:  Successful US guided right thoracentesis. Yielded 1.1 L of hazy, blood tinged fluid. Pt developed severe coughing spell and c/o chest discomfort. Procedure terminated at this point No immediate complications.  Specimen was sent for labs. CXR ordered.  Ascencion Dike PA-C 12/13/2017 9:54 AM

## 2017-12-14 ENCOUNTER — Ambulatory Visit: Payer: Medicare Other | Admitting: Surgery

## 2017-12-14 DIAGNOSIS — N186 End stage renal disease: Secondary | ICD-10-CM | POA: Diagnosis not present

## 2017-12-14 DIAGNOSIS — Z992 Dependence on renal dialysis: Secondary | ICD-10-CM | POA: Diagnosis not present

## 2017-12-14 LAB — RENAL FUNCTION PANEL
Albumin: 2.4 g/dL — ABNORMAL LOW (ref 3.5–5.0)
Anion gap: 12 (ref 5–15)
BUN: 25 mg/dL — ABNORMAL HIGH (ref 6–20)
CHLORIDE: 98 mmol/L — AB (ref 101–111)
CO2: 24 mmol/L (ref 22–32)
Calcium: 8.3 mg/dL — ABNORMAL LOW (ref 8.9–10.3)
Creatinine, Ser: 3.36 mg/dL — ABNORMAL HIGH (ref 0.61–1.24)
GFR calc Af Amer: 22 mL/min — ABNORMAL LOW (ref >60)
GFR, EST NON AFRICAN AMERICAN: 19 mL/min — AB (ref >60)
Glucose, Bld: 120 mg/dL — ABNORMAL HIGH (ref 65–99)
POTASSIUM: 4 mmol/L (ref 3.5–5.1)
Phosphorus: 3.4 mg/dL (ref 2.5–4.6)
Sodium: 134 mmol/L — ABNORMAL LOW (ref 135–145)

## 2017-12-14 LAB — ACID FAST SMEAR (AFB, MYCOBACTERIA)

## 2017-12-14 LAB — CBC
HEMATOCRIT: 30.8 % — AB (ref 39.0–52.0)
Hemoglobin: 10.3 g/dL — ABNORMAL LOW (ref 13.0–17.0)
MCH: 25.7 pg — AB (ref 26.0–34.0)
MCHC: 33.4 g/dL (ref 30.0–36.0)
MCV: 76.8 fL — AB (ref 78.0–100.0)
Platelets: 188 10*3/uL (ref 150–400)
RBC: 4.01 MIL/uL — ABNORMAL LOW (ref 4.22–5.81)
RDW: 16.8 % — ABNORMAL HIGH (ref 11.5–15.5)
WBC: 5.8 10*3/uL (ref 4.0–10.5)

## 2017-12-14 LAB — PROTIME-INR
INR: 1.45
Prothrombin Time: 17.5 seconds — ABNORMAL HIGH (ref 11.4–15.2)

## 2017-12-14 LAB — ACID FAST SMEAR (AFB): ACID FAST SMEAR - AFSCU2: NEGATIVE

## 2017-12-14 LAB — GLUCOSE, CAPILLARY: Glucose-Capillary: 110 mg/dL — ABNORMAL HIGH (ref 65–99)

## 2017-12-14 MED ORDER — WARFARIN SODIUM 2.5 MG PO TABS: ORAL_TABLET | ORAL | 0 refills | Status: DC

## 2017-12-14 MED ORDER — METOPROLOL TARTRATE 25 MG PO TABS: 12.5000 mg | ORAL_TABLET | Freq: Two times a day (BID) | ORAL | 0 refills | Status: DC

## 2017-12-14 MED ORDER — BENZONATATE 100 MG PO CAPS: 100.0000 mg | ORAL_CAPSULE | Freq: Three times a day (TID) | ORAL | 0 refills | Status: DC

## 2017-12-14 MED ORDER — LIDOCAINE-PRILOCAINE 2.5-2.5 % EX CREA: 1.0000 "application " | TOPICAL_CREAM | CUTANEOUS | Status: DC | PRN

## 2017-12-14 MED ORDER — ALTEPLASE 2 MG IJ SOLR: 2.0000 mg | Freq: Once | INTRAMUSCULAR | Status: DC | PRN

## 2017-12-14 MED ORDER — WARFARIN SODIUM 5 MG PO TABS
5.0000 mg | ORAL_TABLET | Freq: Once | ORAL | Status: AC
  Administered 2017-12-14: 5 mg via ORAL
  Filled 2017-12-14: qty 1

## 2017-12-14 MED ORDER — PENTAFLUOROPROP-TETRAFLUOROETH EX AERO: 1.0000 "application " | INHALATION_SPRAY | CUTANEOUS | Status: DC | PRN

## 2017-12-14 MED ORDER — SODIUM CHLORIDE 0.9 % IV SOLN: 100.0000 mL | INTRAVENOUS | Status: DC | PRN

## 2017-12-14 MED ORDER — FLUTICASONE PROPIONATE 50 MCG/ACT NA SUSP: 2.0000 | Freq: Every day | NASAL | 0 refills | Status: DC

## 2017-12-14 MED ORDER — HEPARIN SODIUM (PORCINE) 1000 UNIT/ML DIALYSIS: 1000.0000 [IU] | INTRAMUSCULAR | Status: DC | PRN

## 2017-12-14 MED ORDER — RENA-VITE PO TABS: 1.0000 | ORAL_TABLET | Freq: Every day | ORAL | 0 refills | Status: DC

## 2017-12-14 MED ORDER — LIDOCAINE HCL (PF) 1 % IJ SOLN: 5.0000 mL | INTRAMUSCULAR | Status: DC | PRN

## 2017-12-14 NOTE — Progress Notes (Signed)
PT Cancellation Note  Patient Details Name: Teven Mittman MRN: [id number] DOB: May 12, 1961   Cancelled Treatment:    Reason Eval/Treat Not Completed: Patient at procedure or test/unavailable. Pt off floor at HD. Acute PT to return as able.  Kittie Plater, PT, DPT Pager #: [phone] Office #: [phone]    Berline Lopes 12/14/2017, 8:58 AM

## 2017-12-14 NOTE — Procedures (Signed)
I have personally attended this patient's dialysis session.   Pre HD standing weight 59.2 UFG set for 2-3 Post HD standing weight will be new EDW 4K bath pending labs, trying to avoid hypokalemia w/AF L AVF had to be cannulated with 17's but running well I think still OK for [facility] removal.  Jamal Maes, MD [place] Pager 12/14/2017, 8:55 AM

## 2017-12-14 NOTE — Progress Notes (Signed)
[facility]   CC: ESRD   HPI: Michael [name] is a 57 y.o. male who has been on HD through  functioning Hemodialysis access in the left upper extremity. They are here for HD catheter removal. Pt. denies signs of steal syndrome.  Past Medical History:  Diagnosis Date  . Anemia of chronic disease   . Asthma    said he does not know  . Chronic kidney disease (CKD), stage IV (severe) ([place])   . Chronic systolic CHF (congestive heart failure) (HCC)    a. prior EF normal; b. Echo 10/16: Mild LVH, EF 30-35%, mitral valve bioprosthesis present without evidence of stenosis or regurgitation, severe LAE, mild RVE with mild to moderately reduced RVSF, moderate RAE  . Cirrhosis ([place])   . Coronary artery disease   . Diabetes mellitus type 2 in obese ([place])   . Diabetes mellitus without complication ([place])   . Dyspnea   . GERD (gastroesophageal reflux disease)   . H/O tricuspid valve repair 2012  . History of echocardiogram    Echo at [facility] in [facility] 4/12: mod LVH, EF 60-65%, mod LAE, tissue MVR ok, mod TR, mod pulmo HTN with RVSP 48 mmHg  . Hypertension   . Obstructive sleep apnea   . Paroxysmal atrial fibrillation (HCC)    s/p Maze procedure at time of MVR in 2011  . Pulmonary HTN ([place])   . 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 Thrombocytopenia ([place])     Family Hx Family History  Problem Relation Age of Onset  . Heart attack Neg Hx     Social HX Social History   Tobacco Use  . Smoking status: Former Smoker    Packs/day: 1.00    Types: Cigarettes  . Smokeless tobacco: Never Used  Substance Use Topics  . Alcohol use: No  . Drug use: No    Allergies No Known Allergies  Medications Current Facility-Administered Medications  Medication Dose Route Frequency Provider Last Rate Last Dose  . 0.9 %  sodium chloride infusion  250 mL Intravenous PRN Opyd, Ilene Qua, MD      . acetaminophen (TYLENOL) tablet 650 mg  650 mg Oral Q6H PRN Opyd,  Ilene Qua, MD       Or  . acetaminophen (TYLENOL) suppository 650 mg  650 mg Rectal Q6H PRN Opyd, Ilene Qua, MD      . acetaminophen-codeine (TYLENOL #3) 300-30 MG per tablet 1 tablet  1 tablet Oral Q6H PRN Opyd, Ilene Qua, MD   1 tablet at 12/07/17 2143  . allopurinol (ZYLOPRIM) tablet 150 mg  150 mg Oral Daily Opyd, Ilene Qua, MD   150 mg at 12/14/17 1400  . atorvastatin (LIPITOR) tablet 20 mg  20 mg Oral Daily Opyd, Ilene Qua, MD   20 mg at 12/13/17 1705  . benzonatate (TESSALON) capsule 200 mg  200 mg Oral TID Vianne Bulls, MD   200 mg at 12/14/17 1356  . [START ON 12/16/2017] Darbepoetin Alfa (ARANESP) injection 100 mcg  100 mcg Intravenous Q Wed-HD Alric Seton, PA-C      . diclofenac sodium (VOLTAREN) 1 % transdermal gel 4 g  4 g Topical QID PRN Opyd, Ilene Qua, MD      . digoxin (LANOXIN) tablet 0.0625 mg  0.0625 mg Oral Santiago Bur, MD   0.0625 mg at 12/14/17 1359  . docusate sodium (COLACE) capsule 100 mg  100 mg Oral BID Opyd, Ilene Qua, MD   100 mg  at 12/14/17 1356  . fluticasone (FLONASE) 50 MCG/ACT nasal spray 2 spray  2 spray Each Nare Daily Bonnielee Haff, MD   2 spray at 12/14/17 1356  . HYDROcodone-homatropine (HYCODAN) 5-1.5 MG/5ML syrup 5 mL  5 mL Oral Q4H PRN Nita Sells, MD   5 mL at 12/07/17 2143  . hydrocortisone cream 1 % 1 application  1 application Topical TID PRN Nita Sells, MD   1 application at 48/54/62 0858  . hydrOXYzine (ATARAX/VISTARIL) tablet 12.5-25 mg  12.5-25 mg Oral TID PRN Vianne Bulls, MD   25 mg at 12/11/17 0647  . insulin aspart (novoLOG) injection 0-9 Units  0-9 Units Subcutaneous TID WC Nita Sells, MD   1 Units at 12/13/17 1217  . metoprolol tartrate (LOPRESSOR) tablet 12.5 mg  12.5 mg Oral BID Bonnielee Haff, MD   12.5 mg at 12/13/17 2219  . midodrine (PROAMATINE) tablet 10 mg  10 mg Oral TID WC Opyd, Ilene Qua, MD   10 mg at 12/14/17 1357  . multivitamin (RENA-VIT) tablet 1 tablet  1 tablet Oral Nile Riggs, MD   1 tablet at 12/13/17 2219  . oxyCODONE (Oxy IR/ROXICODONE) immediate release tablet 5 mg  5 mg Oral Q8H PRN Opyd, Ilene Qua, MD   5 mg at 12/09/17 2109  . pantoprazole (PROTONIX) EC tablet 40 mg  40 mg Oral Daily Opyd, Ilene Qua, MD   40 mg at 12/14/17 1357  . sodium chloride flush (NS) 0.9 % injection 3 mL  3 mL Intravenous Q12H Opyd, Ilene Qua, MD   3 mL at 12/12/17 2322  . sodium chloride flush (NS) 0.9 % injection 3 mL  3 mL Intravenous Q12H Opyd, Ilene Qua, MD   3 mL at 12/13/17 2219  . sodium chloride flush (NS) 0.9 % injection 3 mL  3 mL Intravenous PRN Opyd, Ilene Qua, MD      . tiZANidine (ZANAFLEX) tablet 4 mg  4 mg Oral Q8H PRN Opyd, Ilene Qua, MD   4 mg at 12/07/17 2112  . warfarin (COUMADIN) tablet 5 mg  5 mg Oral ONCE-1800 Bonnielee Haff, MD      . Warfarin - Pharmacist Dosing Inpatient   Does not apply q1800 Rolla Flatten [facility]        Labs COAG Lab Results  Component Value Date   INR 1.45 12/14/2017   INR 1.54 12/13/2017   INR 1.94 12/12/2017   No results found for: PTT  PHYSICAL EXAM  Vitals:   12/14/17 1230 12/14/17 1448  BP:  98/61  Pulse: 92 (!) 110  Resp: 17 17  Temp:  98 F (36.7 C)  SpO2:  100%    General:  WDWN in NAD HENT: WNL Eyes: Pupils equal Pulmonary: normal non-labored breathing  Cardiac: RRR, Skin: normal, no cyanosis, jaundice, pallor or bruising Vascular Exam/Pulses: 2+  pulses in LEFT upper extremity. Extremities without ischemic changes, no Gangrene , no cellulitis; no open wounds;   There is a good thrill and good bruit in the left AV fistula. Hand grip is 5/5 and sensation in digits is intact;   Impression: This is a 57 y.o. male who has a functioning HD access.  Plan: Removal of Right IJ HD catheter [name] 12/14/2017 3:08 PM    [facility] Catheter Removal Procedure Note  Diagnosis: ESRD with Functioning AVF/AVGG  Plan:  Remove right diatek catheter  Consent  signed:  yes Time out completed:  yes Coumadin:  Yes.  INR  6.43 PT/INR (if applicable):   Other labs:   Procedure: 1.  Sterile prepping and draping over catheter area 2. 0 ml 2% lidocaine plain instilled at removal site. 3.  right catheter removed in its entirety with cuff in tact. 4.  Complications: none  5. Tip of catheter sent for culture:  no   Patient tolerated procedure well:  yes Pressure held, no bleeding noted, dressing applied Instructions given to the pt regarding wound care and bleeding.  Other:  [name] 12/14/2017 3:07 PM

## 2017-12-14 NOTE — Discharge Summary (Signed)
[facility]  Physician Discharge Summary   Patient ID: Michael [name] MRN: [id number] DOB/AGE: Mar 29, 1961 57 y.o.  Admit date: 11/30/2017 Discharge date: 12/14/2017  PCP: Charlott Rakes, MD  DISCHARGE DIAGNOSES:  Principal Problem:   SIRS (systemic inflammatory response syndrome) (HCC) Active Problems:   Atrial fibrillation with RVR (HCC)   Insulin dependent diabetes mellitus (HCC)   ESRD (end stage renal disease) on dialysis (HCC)   Chest pain   CAD (coronary artery disease)   Fluid overload   ESRD (end stage renal disease) (HCC)   Pleural effusion   RECOMMENDATIONS FOR OUTPATIENT FOLLOW UP: 1. INR to be checked on Thursday 2. Patient to follow-up with primary care provider.   3. He will need chest x-ray in 2-3 weeks to follow-up on right-sided pleural effusion. 4. Pleural fluid cytology and cultures are pending.   DISCHARGE CONDITION: fair  Diet recommendation: As before  Filed Weights   12/13/17 0500 12/14/17 0335 12/14/17 0804  Weight: 59.9 kg (132 lb 0.9 oz) 59 kg (130 lb 1.1 oz) 59.2 kg (130 lb 8.2 oz)    INITIAL HISTORY: 57 year old male with a past medical history of end-stage renal disease on hematoma, history of hypertension on midodrine, chronic pain, atrial fibrillation on warfarin, liver cirrhosis, coronary artery disease status post CABG, chronic respiratory failure, type 2 diabetes, history of gout presented with shortness of breath.  There was some concern for sepsis at the time of admission.  He was also noted to have pleural effusion.  Patient was hospitalized for further management.  Consultants: Nephrology.  Pulmonology.  Procedures: Thoracentesis.  Hemodialysis.  Antibiotics: Patient has completed treatment with antibiotics   HOSPITAL COURSE:   Right-sided pleural effusion likely exudative Etiology for this effusion is not entirely clear.  Patient was getting frequent dialysis to see if that would decrease the effusion. The  effusion did appear to reduce in size but remained significantly large.  Pulmonology was consulted.  Thoracentesis was recommended.  Warfarin was held.    Patient underwent thoracentesis.  Pleural fluid analysis showed LDH to be 187.  Total protein 3.7.  Serum LDH 224.  Pleural fluid to serum total protein ratio is 0.5.  Pleural fluid to serum LDH ratio is 0.83.  Fluid analysis does suggest exudate.  Fluid was blood-tinged.  Cytology is pending.  Gram stain was unremarkable.  Cultures are pending.  Discussed with pulmonology.  They recommend repeating chest x-ray in 2 weeks.  If pleural effusion continues to reaccumulate patient will need to be referred to pulmonology for outpatient management.  Okay to resume warfarin.  He remains off of oxygen.    Sepsis Respiratory viral panel was positive for coronavirus.  He was initially placed on vancomycin and ceftriaxone.  Currently off of antibiotics.  Initially had elevated lactic acid levels as well.  Currently he does not have sepsis physiology.  End-stage renal disease on hemodialysis on Monday Wednesday Friday Patient was dialyzed frequently to see if this would reduce his pleural effusion.  Otherwise remains stable.  Patient also has chronic hypoension and is on midodrine.  Plan is also for the dialysis catheter to be removed today by vascular surgery.  Chronic Atrial fibrillation Heart rate was noted to be elevated after dialysis on 1/25.  Could be because of frequent dialysis and perhaps excess volume has been pulled off.  Heart rate has improved with the initiation of low-dose metoprolol.    Continue warfarin.  INR to be checked at Coumadin clinic on Thursday.  History of coronary  artery disease status post CABG/mitral valve repair Stable.    Anemia of chronic kidney disease Hemoglobin is stable.  No evidence for overt bleeding.  Chronic pain Continue home medications.  Chronic respiratory failure on home oxygen Stable.  History of  liver cirrhosis, idiopathic with thrombocytopenia Continue lactulose.  Currently not encephalopathic.  History of gout Continue home medications.  Foot pain has improved.  Diabetes mellitus type 2 Not on medications.  Continue SSI.  CBGs are reasonably well controlled.  Overall stable.  Okay for discharge home after hemodialysis catheter has been pulled out.  Has been seen by physical therapy.  No PT needs have been identified.    PERTINENT LABS:  The results of significant diagnostics from this hospitalization (including imaging, microbiology, ancillary and laboratory) are listed below for reference.    Microbiology: Recent Results (from the past 240 hour(s))  Gram stain     Status: None   Collection Time: 12/13/17 11:17 AM  Result Value Ref Range Status   Specimen Description PLEURAL RIGHT  Final   Special Requests NONE  Final   Gram Stain   Final    RARE WBC PRESENT,BOTH PMN AND MONONUCLEAR NO ORGANISMS SEEN    Report Status 12/13/2017 FINAL  Final     Labs: Basic Metabolic Panel: Recent Labs  Lab 12/09/17 0430 12/10/17 1020 12/11/17 0611 12/14/17 0850  NA 134* 133* 134* 134*  K 4.0 3.7 3.6 4.0  CL 98* 97* 98* 98*  CO2 27 25 26 24   GLUCOSE 93 81 108* 120*  BUN 19 14 11  25*  CREATININE 3.43* 2.95* 2.73* 3.36*  CALCIUM 8.4* 8.5* 8.7* 8.3*  PHOS 3.4 3.7  --  3.4   Liver Function Tests: Recent Labs  Lab 12/09/17 0430 12/10/17 1020 12/13/17 1402 12/14/17 0850  AST  --   --  32  --   ALT  --   --  12*  --   ALKPHOS  --   --  169*  --   BILITOT  --   --  2.1*  --   PROT  --   --  7.5  --   ALBUMIN 2.4* 2.5* 2.7* 2.4*   CBC: Recent Labs  Lab 12/09/17 0430 12/10/17 1015 12/14/17 0850  WBC 6.7 6.2 5.8  HGB 10.4* 10.6* 10.3*  HCT 30.9* 31.9* 30.8*  MCV 77.1* 76.7* 76.8*  PLT 188 172 188   BNP: BNP (last 3 results) Recent Labs    05/30/17 1208 06/25/17 1253 11/23/17 2222  BNP 1,404.9* 875.7* 977.3*    CBG: Recent Labs  Lab 12/12/17 2232  12/13/17 0750 12/13/17 1208 12/13/17 1655 12/13/17 2125  GLUCAP 99 92 136* 112* 145*     IMAGING STUDIES Dg Chest 1 View  Result Date: 12/13/2017 CLINICAL DATA:  Status post right thoracentesis EXAM: CHEST 1 VIEW COMPARISON:  12/11/2017 FINDINGS: Interval decrease right pleural effusion. No pneumothorax. Persistent right base pleural-parenchymal opacity with small right pleural effusion. The cardio pericardial silhouette is enlarged. Right IJ dialysis catheter again noted. The visualized bony structures of the thorax are intact. IMPRESSION: Decreased right pleural effusion without evidence for pneumothorax. Electronically Signed   By: Misty Stanley M.D.   On: 12/13/2017 10:47   Dg Chest 2 View  Result Date: 12/11/2017 CLINICAL DATA:  Pleural effusion EXAM: CHEST  2 VIEW COMPARISON:  December 09, 2017 FINDINGS: Central catheter tip is in the superior vena cava. No pneumothorax. There is a persistent sizable pleural effusion on the right with consolidation in portions  of the right middle and lower lobes. The left lung is clear. Heart is mildly enlarged with pulmonary vascularity within normal limits. No adenopathy. Patient is status post coronary artery bypass grafting. No evident bone lesions. There is aortic atherosclerosis. IMPRESSION: Persistent sizable pleural effusion on the right with consolidation in portions of the right middle and lower lobes, largely due to atelectatic change. Left lung clear. Stable cardiac prominence. Central catheter tip in superior vena cava. No pneumothorax. There is aortic atherosclerosis. Aortic Atherosclerosis (ICD10-I70.0). Electronically Signed   By: Lowella Grip III M.D.   On: 12/11/2017 10:45   Dg Chest 2 View  Result Date: 12/09/2017 CLINICAL DATA:  Shortness of Breath EXAM: CHEST  2 VIEW COMPARISON:  12/07/2017 CT of the chest FINDINGS: Large pleural effusion on the right is again identified. Right-sided dialysis catheter is seen. Stable cardiomegaly  and postsurgical changes are noted. The left lung remains clear. IMPRESSION: Stable large right-sided pleural effusion. Electronically Signed   By: Inez Catalina M.D.   On: 12/09/2017 09:54   Dg Chest 2 View  Result Date: 12/03/2017 CLINICAL DATA:  Patient admitted 12/01/2017 with chest pain and shortness of breath. EXAM: CHEST  2 VIEW COMPARISON:  Single-view of the chest 11/30/2017. PA and lateral chest 11/23/2017. FINDINGS: Right pleural effusion and basilar airspace disease have markedly worsened. The patient has a small left effusion and mild basilar airspace disease. Cardiomegaly and pulmonary vascular congestion are noted. Aortic atherosclerosis is seen. Dialysis catheter is in place. No acute bony abnormality. IMPRESSION: Marked worsening of a large right pleural effusion and basilar airspace disease which could be atelectasis or pneumonia. Very small left pleural effusion and basilar atelectasis. Cardiomegaly and pulmonary vascular congestion. Electronically Signed   By: Inge Rise M.D.   On: 12/03/2017 10:39   Dg Chest 2 View  Result Date: 11/23/2017 CLINICAL DATA:  Chest pain. EXAM: CHEST  2 VIEW COMPARISON:  09/18/2017 FINDINGS: Right-sided dialysis catheter in place. Post median sternotomy. Unchanged cardiomegaly. Bilateral pleural effusions, unchanged on the right and improved on the left with associated bibasilar opacities. Improved pulmonary edema. No new airspace disease. No pneumothorax. IMPRESSION: Cardiomegaly with persistent right pleural effusion and basilar opacity, likely atelectasis. Improved left pleural effusion from prior exam. Improved pulmonary edema. Electronically Signed   By: Jeb Levering M.D.   On: 11/23/2017 23:23   Ct Chest Wo Contrast  Result Date: 12/07/2017 CLINICAL DATA:  Enlarging right pleural effusion and cough. History of end-stage renal disease. EXAM: CT CHEST WITHOUT CONTRAST TECHNIQUE: Multidetector CT imaging of the chest was performed following the  standard protocol without IV contrast. COMPARISON:  Chest x-ray on 12/06/2017 FINDINGS: Cardiovascular: The heart is substantially enlarged. No pericardial fluid identified. Calcified coronary artery plaque identified in a 3 vessel distribution. There are some peripheral calcifications associated with the lateral wall of the right atrium. There appears to be a mitral valve replacement. The thoracic aorta shows mild scattered calcified plaque without aneurysmal disease. Central pulmonary arteries are nondilated. Mediastinum/Nodes: There are some scattered small lymph nodes in the mediastinum. The largest lower right paratracheal lymph node measures 1.3 cm in short axis. No hilar or axillary lymphadenopathy identified. Lungs/Pleura: Moderate to large sized right pleural effusion identified with fluid density consistent with probable simple fluid. The effusion is not significantly loculated and is causing compressive atelectasis the right lower lobe and middle lobe. No pleural masses identified. Some scattered scarring and atelectasis present in the aerated right lung as well as in the left lung. No  obvious focal parenchymal nodules. Upper Abdomen: No significant upper abdominal findings. Musculoskeletal: No significant bony findings. IMPRESSION: 1. Substantial cardiac enlargement. Evidence of prior mitral valve replacement. 2. Coronary atherosclerosis with calcified plaque in a 3 vessel distribution. Thoracic aortic atherosclerosis also present without aneurysm. 3. Scattered small non-specific lymph nodes in the mediastinum. 4. Moderate to large right pleural effusion which appears to be of simple fluid density and causes compressive atelectasis of the right lower lobe and middle lobe. No significant loculation or evidence of pleural masses. No obvious pulmonary malignancy. Aortic Atherosclerosis (ICD10-I70.0). Electronically Signed   By: Aletta Edouard M.D.   On: 12/07/2017 14:20   Dg Chest Port 1 View  Result  Date: 12/06/2017 CLINICAL DATA:  57 year old male with history of pneumonia. EXAM: PORTABLE CHEST 1 VIEW COMPARISON:  Chest x-ray 12/03/2017. FINDINGS: Right internal jugular Port-A-Cath with tip terminating at the superior cavoatrial junction. Status post median sternal. Large right pleural effusion with atelectasis and/or consolidation in the base of the right lung. Left lung is clear. Small left pleural effusion. No evidence of pulmonary edema. Cardiomegaly. The patient is rotated to the left on today's exam, resulting in distortion of the mediastinal contours and reduced diagnostic sensitivity and specificity for mediastinal pathology. Aortic atherosclerosis. IMPRESSION: Enlarging large right pleural effusion with worsening atelectasis and/or consolidation in the base of the right hemithorax. The possibility of an empyema should be considered. Electronically Signed   By: Vinnie Langton M.D.   On: 12/06/2017 13:24   Dg Chest Portable 1 View  Result Date: 11/30/2017 CLINICAL DATA:  Chest pain EXAM: PORTABLE CHEST 1 VIEW COMPARISON:  11/23/2017 FINDINGS: Large-bore RIGHT central venous line. Sternotomy wires overlie enlarged cardiac silhouette. There is improvement in bilateral pleural effusions. Mild interstitial edema remains. IMPRESSION: Mild interstitial edema.  Improved vascular pattern from 11/23/2017. Electronically Signed   By: Suzy Bouchard M.D.   On: 11/30/2017 17:16   US Thoracentesis Asp Pleural Space W/img Guide  Result Date: 12/13/2017 INDICATION: Shortness of breath. Right-sided pleural effusion. Request diagnostic and therapeutic thoracentesis. EXAM: ULTRASOUND GUIDED RIGHT THORACENTESIS MEDICATIONS: None. COMPLICATIONS: None immediate. Postprocedural chest x-ray negative for pneumothorax. PROCEDURE: An ultrasound guided thoracentesis was thoroughly discussed with the patient and questions answered. The benefits, risks, alternatives and complications were also discussed. The patient  understands and wishes to proceed with the procedure. Written consent was obtained. Ultrasound was performed to localize and mark an adequate pocket of fluid in the right chest. The area was then prepped and draped in the normal sterile fashion. 1% Lidocaine was used for local anesthesia. Under ultrasound guidance a Safe-T-Centesis catheter was introduced. Thoracentesis was performed. The catheter was removed and a dressing applied. FINDINGS: A total of approximately 1.1 L of hazy, blood-tinged fluid was removed. Samples were sent to the laboratory as requested by the clinical team. IMPRESSION: Successful ultrasound guided right thoracentesis yielding 1.1 L of pleural fluid. Read by: Ascencion Dike PA-C Electronically Signed   By: Corrie Mckusick D.O.   On: 12/13/2017 10:16    DISCHARGE EXAMINATION: Vitals:   12/14/17 1100 12/14/17 1130 12/14/17 1200 12/14/17 1230  BP: (P) 102/61 (!) (P) 127/43 (!) (P) 85/60   Pulse: (!) (P) 102 (!) (P) 110 (P) 99 (P) 92  Resp: (P) 16 (P) 16 (P) 16 (P) 17  Temp:      TempSrc:      SpO2:      Weight:      Height:       General appearance: alert, cooperative,  appears stated age and no distress Resp: Improved aeration right lung.  No wheezing.  No rhonchi. Cardio: regular rate and rhythm, S1, S2 normal, no murmur, click, rub or gallop GI: soft, non-tender; bowel sounds normal; no masses,  no organomegaly Extremities: extremities normal, atraumatic, no cyanosis or edema  DISPOSITION: Home  Discharge Instructions    AMB Referral to [facility]   Complete by:  As directed    Please assign to [place] for transition of care. Multiple hospitalizations. Agreeable to [place] and to using interpreter line. Currently active with [facility] Pharmacist. Currently at [facility]. Please call with questions. [name], [place], [facility] [phone]   Reason for consult:  Please assign to [facility] RNCM   Diagnoses  of:  Kidney Failure   Expected date of contact:  1-3 days (reserved for hospital discharges)   Call MD for:  extreme fatigue   Complete by:  As directed    Call MD for:  persistant dizziness or light-headedness   Complete by:  As directed    Call MD for:  persistant nausea and vomiting   Complete by:  As directed    Call MD for:  severe uncontrolled pain   Complete by:  As directed    Call MD for:  temperature >100.4   Complete by:  As directed    Discharge instructions   Complete by:  As directed    Your INR will need to be checked on Thursday at the coumadin clinic. Take your medications as prescribed. Please follow up with your PCP. You will need to undergo chest x ray in 2 weeks and then consider referral to pulmonology if effusion persists. Not all the results are back from fluid sent to lab. Your PCP will need to follow up.  You were cared for by a hospitalist during your hospital stay. If you have any questions about your discharge medications or the care you received while you were in the hospital after you are discharged, you can call the unit and asked to speak with the hospitalist on call if the hospitalist that took care of you is not available. Once you are discharged, your primary care physician will handle any further medical issues. Please note that NO REFILLS for any discharge medications will be authorized once you are discharged, as it is imperative that you return to your primary care physician (or establish a relationship with a primary care physician if you do not have one) for your aftercare needs so that they can reassess your need for medications and monitor your lab values. If you do not have a primary care physician, you can call [phone] for a physician referral.   Increase activity slowly   Complete by:  As directed         Allergies as of 12/14/2017   No Known Allergies     Medication List    STOP taking these medications   acetaminophen-codeine 300-30 MG  tablet Commonly known as:  TYLENOL #3   metoprolol succinate 25 MG 24 hr tablet Commonly known as:  TOPROL-XL     TAKE these medications   acetaminophen 325 MG tablet Commonly known as:  TYLENOL Take 650 mg by mouth every 6 (six) hours.   allopurinol 300 MG tablet Commonly known as:  ZYLOPRIM Take 0.5 tablets (150 mg total) by mouth daily. What changed:  when to take this   atorvastatin 20 MG tablet Commonly known as:  LIPITOR Take  1 tablet (20 mg total) by mouth daily.   benzonatate 100 MG capsule Commonly known as:  TESSALON Take 1 capsule (100 mg total) by mouth 3 (three) times daily.   calcium acetate 667 MG capsule Commonly known as:  PHOSLO Take 1 capsule (667 mg total) by mouth 3 (three) times daily with meals. What changed:    how much to take  when to take this  additional instructions   colchicine 0.6 MG tablet Take 0.5 tablets (0.3 mg total) by mouth 2 (two) times a week. What changed:    when to take this  reasons to take this   fluticasone 50 MCG/ACT nasal spray Commonly known as:  FLONASE Place 2 sprays into both nostrils daily.   folic acid 1 MG tablet Commonly known as:  FOLVITE TAKE 1 TABLET BY MOUTH DAILY. What changed:    how much to take  how to take this  when to take this   hydrOXYzine 25 MG tablet Commonly known as:  ATARAX/VISTARIL 1/2-1 tablets tid prn itching What changed:    how much to take  how to take this  when to take this  reasons to take this  additional instructions   metoprolol tartrate 25 MG tablet Commonly known as:  LOPRESSOR Take 0.5 tablets (12.5 mg total) by mouth 2 (two) times daily.   midodrine 5 MG tablet Commonly known as:  PROAMATINE Take 2 tablets (10 mg total) by mouth 3 (three) times daily with meals. What changed:    how much to take  when to take this  additional instructions   multivitamin Tabs tablet Take 1 tablet by mouth at bedtime.   omeprazole 20 MG capsule Commonly  known as:  PRILOSEC Take 1 capsule (20 mg total) by mouth daily.   oxyCODONE 5 MG immediate release tablet Commonly known as:  Oxy IR/ROXICODONE Take 5 mg by mouth every 8 (eight) hours as needed for pain.   OXYGEN Inhale 2 L into the lungs daily as needed.   tiZANidine 4 MG tablet Commonly known as:  ZANAFLEX Take 1 tablet (4 mg total) by mouth every 8 (eight) hours as needed for muscle spasms.   warfarin 2.5 MG tablet Commonly known as:  COUMADIN Take as directed. If you are unsure how to take this medication, talk to your nurse or doctor. Original instructions:  Take 2 tablets today and tomorrow, then start taking 1 tablet (2.5mg ) daily except 1.5 tablets (3.75mg ) on Wednesdays and Saturdays and then as directed by coumadin clinic What changed:    how much to take  how to take this  when to take this  additional instructions        Follow-up Information    [place] Office Follow up.   Specialty:  Cardiology Why:  go on thursday to check INR Contact information: [address] [place] [phone]       [name], MD. Schedule an appointment as soon as possible for a visit in 2 week(s).   Specialty:  Family Medicine Why:  needs CXR in 2-3 weeks Contact information: [place] [address] [phone]           TOTAL DISCHARGE TIME: 35 mins  Bonnielee Haff  [facility] Pager [phone]  12/14/2017, 2:24 PM

## 2017-12-14 NOTE — Progress Notes (Signed)
[facility] Progress Note   Dialysis Orders: MWF South 4h  300/800  61 kg (getting down to 63kg at center)  3K/2.25 bath  TDC R / LUA BVT+bruit (2nd stage 11/03/17 Dr Donzetta Matters)  Hep none  - Mircera 62mcg IV q 2 weeks (last given 11/18/17) - No VDRA  Assessment:  1. Dyspnea - vol overload/ pulm edema + pleural effusion. Former resolved, latter not. 1.1 L thoracentesis 1/27  2. SIRS - resolved, +coronavirus 3. R pleural effusion s/p thoracentesis  1/27 by radiology-procedure terminated after severe coughing/chest discomfort; CXR showed decrease in right pleural effusion  4. A fib - On dig. Warfarin dosing per pharmacy.  5. ESRD - HD MWFSat- HD today. 4K bath pending labs. Avoid low K with afib hx. Pre weight today 59.2 and will have new lower EDW at discharge 6. Anemia of CKD- last Hgb 10.6. 100 mcg Aranesp given 1/16.- none last week-not clear why-continue same dose ESA Wed here or at outpatient HD unit 7. Secondary hyperparathyroidism - Ca and P in goal. No VDRA- no binders.  8. Hypotension/vol - chronic / RHF, on midodrine, cont to lower dry wt as tol; keep SBP > 80 9. Nutrition - Alb 2.5. Renal diet with fluid restrictions. Renavite. Nepro. 10. S/p MVR/TNR/Maze- per primary 11. Severe heart disease/CM/RHF . -requiring 4 day a week HD/tirating EDW down 12. Chronic Resp Failure - on home O2 13. H/o cirrhosis - per primary 14. DMT2 - diet controlled.  15. L arm AVF - unable to cannulate with 16's today. [place] fine with 17's. I think still OK for TDC removal.   16. Afib - on dig/MTP/back on coumadin pharmacy dosing  17. Dispo - He would like to see if he can be dc'd after dialysis and catheter removal. OK w [place]. Will call [facility] about getting TDC out  Jamal Maes, MD [facility] [phone] Pager 12/14/2017, 8:34 AM   Subjective:    Wants catheter out  Says he is "just tired" Breathing OK no supplemental O2  Objective Vitals:   12/13/17  1445 12/13/17 2124 12/14/17 0335 12/14/17 0639  BP: 113/74 105/67  (!) 123/107  Pulse: 90 77  89  Resp: 15 16  17   Temp: 98.7 F (37.1 C) 98.4 F (36.9 C)  98.4 F (36.9 C)  TempSrc: Oral Oral  Oral  SpO2: 100% 98%  98%  Weight:   59 kg (130 lb 1.1 oz)   Height:       Physical Exam General: alert breathing easily - "what about this catheter" Heart: Irreg irreg Rate 90's Normal heart sounds 2/6 murmur LSB Lungs ant clear, diminished at R base Abd soft and not tender No edema of LE's Dialysis Access: left AVF + bruit cannulated with 17 ga needles Right IJ [facility]   Lab Results  Component Value Date   INR 1.45 12/14/2017   INR 1.54 12/13/2017   INR 1.94 12/12/2017     Recent Labs  Lab 12/09/17 0430 12/10/17 1020 12/11/17 0611  NA 134* 133* 134*  K 4.0 3.7 3.6  CL 98* 97* 98*  CO2 27 25 26   GLUCOSE 93 81 108*  BUN 19 14 11   CREATININE 3.43* 2.95* 2.73*  CALCIUM 8.4* 8.5* 8.7*  PHOS 3.4 3.7  --     Recent Labs  Lab 12/09/17 0430 12/10/17 1020 12/13/17 1402  AST  --   --  32  ALT  --   --  12*  ALKPHOS  --   --  169*  BILITOT  --   --  2.1*  PROT  --   --  7.5  ALBUMIN 2.4* 2.5* 2.7*    Recent Labs  Lab 12/09/17 0430 12/10/17 1015  WBC 6.7 6.2  HGB 10.4* 10.6*  HCT 30.9* 31.9*  MCV 77.1* 76.7*  PLT 188 172   Blood Culture    Component Value Date/Time   SDES PLEURAL RIGHT 12/13/2017 1117   SPECREQUEST NONE 12/13/2017 1117   CULT NO GROWTH 5 DAYS 11/30/2017 1740   CULT NO GROWTH 5 DAYS 11/30/2017 1740   REPTSTATUS 12/13/2017 FINAL 12/13/2017 1117    Recent Labs  Lab 12/12/17 2232 12/13/17 0750 12/13/17 1208 12/13/17 1655 12/13/17 2125  GLUCAP 99 92 136* 112* 145*    Lab Results  Component Value Date   INR 1.45 12/14/2017   INR 1.54 12/13/2017   INR 1.94 12/12/2017   Studies/Results: Dg Chest 1 View  Result Date: 12/13/2017 CLINICAL DATA:  Status post right thoracentesis EXAM: CHEST 1 VIEW COMPARISON:  12/11/2017 FINDINGS:  Interval decrease right pleural effusion. No pneumothorax. Persistent right base pleural-parenchymal opacity with small right pleural effusion. The cardio pericardial silhouette is enlarged. Right IJ dialysis catheter again noted. The visualized bony structures of the thorax are intact. IMPRESSION: Decreased right pleural effusion without evidence for pneumothorax. Electronically Signed   By: Misty Stanley M.D.   On: 12/13/2017 10:47   US Thoracentesis Asp Pleural Space W/img Guide  Result Date: 12/13/2017 INDICATION: Shortness of breath. Right-sided pleural effusion. Request diagnostic and therapeutic thoracentesis. EXAM: ULTRASOUND GUIDED RIGHT THORACENTESIS MEDICATIONS: None. COMPLICATIONS: None immediate. Postprocedural chest x-ray negative for pneumothorax. PROCEDURE: An ultrasound guided thoracentesis was thoroughly discussed with the patient and questions answered. The benefits, risks, alternatives and complications were also discussed. The patient understands and wishes to proceed with the procedure. Written consent was obtained. Ultrasound was performed to localize and mark an adequate pocket of fluid in the right chest. The area was then prepped and draped in the normal sterile fashion. 1% Lidocaine was used for local anesthesia. Under ultrasound guidance a Safe-T-Centesis catheter was introduced. Thoracentesis was performed. The catheter was removed and a dressing applied. FINDINGS: A total of approximately 1.1 L of hazy, blood-tinged fluid was removed. Samples were sent to the laboratory as requested by the clinical team. IMPRESSION: Successful ultrasound guided right thoracentesis yielding 1.1 L of pleural fluid. Read by: Ascencion Dike PA-C Electronically Signed   By: Corrie Mckusick D.O.   On: 12/13/2017 10:16   Medications: . sodium chloride     . allopurinol  150 mg Oral Daily  . atorvastatin  20 mg Oral Daily  . benzonatate  200 mg Oral TID  . [START ON 12/16/2017] darbepoetin (ARANESP)  injection - DIALYSIS  100 mcg Intravenous Q Wed-HD  . digoxin  0.0625 mg Oral QODAY  . docusate sodium  100 mg Oral BID  . fluticasone  2 spray Each Nare Daily  . insulin aspart  0-9 Units Subcutaneous TID WC  . metoprolol tartrate  12.5 mg Oral BID  . midodrine  10 mg Oral TID WC  . multivitamin  1 tablet Oral QHS  . pantoprazole  40 mg Oral Daily  . sodium chloride flush  3 mL Intravenous Q12H  . sodium chloride flush  3 mL Intravenous Q12H  . Warfarin - Pharmacist Dosing Inpatient   Does not apply [phone]

## 2017-12-14 NOTE — Discharge Instructions (Signed)
Pleural Effusion °A pleural effusion is an abnormal buildup of fluid in the layers of tissue between your lungs and the inside of your chest (pleural space). These two layers of tissue that line both your lungs and the inside of your chest are called pleura. Usually, there is no air in the space between the pleura, only a thin layer of fluid. If left untreated, a large amount of fluid can build up and cause the lung to collapse. A pleural effusion is usually caused by another disease that requires treatment. °The two main types of pleural effusion are: °· Transudative pleural effusion. This happens when fluid leaks into the pleural space because of a low protein count in your blood or high blood pressure in your vessels. Heart failure often causes this. °· Exudative infusion. This occurs when fluid collects in the pleural space from blocked blood vessels or lymph vessels. Some lung diseases, injuries, and cancers can cause this type of effusion. ° °What are the causes? °Pleural effusion can be caused by: °· Heart failure. °· A blood clot in the lung (pulmonary embolism). °· Pneumonia. °· Cancer. °· Liver failure (cirrhosis). °· Kidney disease. °· Complications from surgery, such as from open heart surgery. ° °What are the signs or symptoms? °In some cases, pleural effusion may cause no symptoms. Symptoms can include: °· Shortness of breath, especially when lying down. °· Chest pain, often worse when taking a deep breath. °· Fever. °· Dry cough that is lasting (chronic). °· Hiccups. °· Rapid breathing. ° °An underlying condition that is causing the pleural effusion (such as heart failure, pneumonia, blood clots, tuberculosis, or cancer) may also cause additional symptoms. °How is this diagnosed? °Your health care provider may suspect pleural effusion based on your symptoms and medical history. Your health care provider will also do a physical exam and a chest X-ray. If the X-ray shows there is fluid in your chest,  you may need to have this fluid removed using a needle (thoracentesis) so it can be tested. °You may also have: °· Imaging studies of the chest, such as: °? Ultrasound. °? CT scan. °· Blood tests for kidney and liver function. ° °How is this treated? °Treatment depends on the cause of the pleural effusion. Treatment may include: °· Taking antibiotic medicines to clear up an infection that is causing the pleural effusion. °· Placing a tube in the chest to drain the effusion (tube thoracostomy). This procedure is often used when there is an infection in the fluid. °· Surgery to remove the fibrous outer layer of tissue from the pleural space (decortication). °· Thoracentesis, which can improve cough and shortness of breath. °· A procedure to put medicine into the chest cavity to seal the pleural space to prevent fluid buildup (pleurodesis). °· Chemotherapy and radiation therapy. These may be required in the case of cancerous (malignant) pleural effusion. ° °Follow these instructions at home: °· Take medicines only as directed by your health care provider. °· Keep track of how long you can gently exercise before you get short of breath. Try simply walking at first. °· Do not use any tobacco products, including cigarettes, chewing tobacco, or electronic cigarettes. If you need help quitting, ask your health care provider. °· Keep all follow-up visits as directed by your health care provider. This is important. °Contact a health care provider if: °· The amount of time that you are able to exercise decreases or does not improve with time. °· You have pain or signs of infection   at the puncture site if you had thoracentesis. Watch for: °? Drainage. °? Redness. °? Swelling. °· You have a fever. °Get help right away if: °· You are short of breath. °· You develop chest pain. °· You develop a new cough. °This information is not intended to replace advice given to you by your health care provider. Make sure you discuss any  questions you have with your health care provider. °Document Released: 11/03/2005 Document Revised: 04/07/2016 Document Reviewed: 03/29/2014 °Elsevier Interactive Patient Education © 2018 Elsevier Inc. ° °

## 2017-12-14 NOTE — Progress Notes (Signed)
Pt given discharge instructions, prescriptions, and care notes. Pt verbalized understanding AEB no further questions or concerns at this time. IV was discontinued, no redness, pain, or swelling noted at this time. Vascular Surgery removed HD cath, dressings clean, dry, intact. Pt told to leave drsgs on for 24 hrs. Telemetry discontinued and Centralized Telemetry was notified. Pt left the floor via wheelchair with staff in stable condition.

## 2017-12-14 NOTE — Progress Notes (Signed)
Per Pt and caregiver at bedside Pt does not have a legal guardian. AVS would not print because of this having been charted. Since Pt said he did not have one, this RN removed this feature from his chart in order to print AVS.

## 2017-12-14 NOTE — Progress Notes (Signed)
ANTICOAGULATION CONSULT NOTE - Follow Up Consult  Pharmacy Consult for Warfarin Indication: atrial fibrillation and tissue MVR  No Known Allergies  Patient Measurements: Height: 5\' 5"  (165.1 cm) Weight: 130 lb 1.1 oz (59 kg) IBW/kg (Calculated) : 61.5 Heparin Dosing Weight:   Vital Signs: Temp: 98.4 F (36.9 C) (01/28 0639) Temp Source: Oral (01/28 0639) BP: 123/107 (01/28 0639) Pulse Rate: 89 (01/28 0639)  Labs: Recent Labs    12/12/17 0326 12/13/17 0239 12/14/17 0321  LABPROT 22.0* 18.3* 17.5*  INR 1.94 1.54 1.45    Estimated Creatinine Clearance: 24.6 mL/min (A) (by C-G formula based on SCr of 2.73 mg/dL (H)).  Assessment: 57 y/o male with ESRD on HD on warfarin 2.5mg  PO daily PTA for AFib, bioprosthetic MV. CHADsVASC = 3. Admit INR 1.92. Warfarin held 1/24-1/26 for thoracentesis - now done on 1/27. Pharmacy consulted to resume warfarin dosing this evening.  INR today is SUBtherapeutic (1.45<<1.54 << 1.94, goal of 2-3). CBC from 1/24 showed Hgb 10.6, plts wnl. Noted thoracentesis today with 1.1L of blood-tinged fluid removed. Discussed need to bridge with Heparin with MD who wants to hold off for now.   Goal of Therapy:  INR 2-3 Monitor platelets by anticoagulation protocol: Yes   Plan:  Repeat Warfarin 5 mg x 1 dose at 1800 today Discharge with 5 mg po daily until follow up INR Wednesday or Thursday  Thank you Anette Guarneri, PharmD [phone]  12/14/2017 8:43 AM

## 2017-12-15 ENCOUNTER — Encounter: Payer: Self-pay | Admitting: *Deleted

## 2017-12-15 ENCOUNTER — Other Ambulatory Visit: Payer: Self-pay | Admitting: Pharmacist

## 2017-12-15 ENCOUNTER — Other Ambulatory Visit: Payer: Self-pay | Admitting: *Deleted

## 2017-12-15 NOTE — Patient Outreach (Signed)
[place] [facility]) Care Management  12/15/2017  Michael [name] [birth year] [id number]  Received notification from [facility] Liaison patient discharged home.    [facility] Pharmacist has made multiple attempts in the past to complete home visit with patient.    Phone outreach to patient, he passed phone to a male whom identified herself as his roommate.  [facility] Pharmacist requested patient be placed back on phone---patient provided verbal consent to speak with his roommate, Michael [name].   Michael [name] states she is staying with patient and filling patient's pill box.  She reports she needs to know which medications patient is to take.  Asked if they have discharge medication list from hospital on 12/14/17---Michael [name] states yes---discussed medication list from hospital is most recent medication list for patient unless a medical provider has made any changes.  Michael [name] states only days patient has available for a home visit are Tuesday or Thursday.   Discussed that [facility] Pharmacist does not routinely come out and fill pill box and solutions for filling pill boxes could be looking into a pharmacy that offers blister packaging of medications, which may have a cost associated with it.  Michael [name] states she plans to fill patient's pill box.   Plan:  Home visit scheduled with patient to review medications in home.    Michael [name], PharmD, [place] [phone]

## 2017-12-15 NOTE — Patient Outreach (Signed)
[place] [facility]) Care Management [place] Telephone Outreach, Transition of Care attempt #1  12/15/2017  Iban Deatley 05/27/1961 [id number]  10:10 am:  Placed call to [facility] interpreter services [phone]) and within 10 minutes had [name] interpreter "Aekrit" ([place]) available on line; Office manager had disconnected call, so attempt was made to conference patient through [place] direct phone line at 10:20 am:  Unsuccessful telephone outreach to [employer], previously active with [place], re- referred to [place] for transition of care after 2 recent hospitalizations:  January 7-10, 2019 for shortness of breath, dyspnea, pleural effusion/ pulmonary edema/ volume overload January 15-28, 2019 for chest pain/ discomfort, cough, tachycardia; patient diagnosed with SIRS and pleural effusion requiring thoracentesis  Patient was discharged home to self-care on December 14, 2017 with home health [facility]) services in place through [place] [facility]) for RN/ PT/OT.  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  Today, I was unable to leave patient HIPAA compliant voice mail message requesting return call back, as automated outgoing voice mail on patient's number stated, "the person you are trying to reach has a voice mail box that is full."    10:30 am:  Spoke with [facility] Pharmacist [name] to update on today's unsuccessful telephone outreach attempt  Plan:  Will re-attempt [place] telephone outreach again tomorrow if I do not hear back from patient first.  Oneta Rack, RN, BSN, [place] Coordinator [facility] Care Management  [phone]

## 2017-12-16 ENCOUNTER — Other Ambulatory Visit: Payer: Self-pay | Admitting: *Deleted

## 2017-12-16 ENCOUNTER — Encounter: Payer: Self-pay | Admitting: *Deleted

## 2017-12-16 ENCOUNTER — Encounter (HOSPITAL_COMMUNITY): Payer: Medicare Other

## 2017-12-16 ENCOUNTER — Encounter: Payer: Medicare Other | Admitting: Vascular Surgery

## 2017-12-16 DIAGNOSIS — I482 Chronic atrial fibrillation: Secondary | ICD-10-CM | POA: Diagnosis not present

## 2017-12-16 DIAGNOSIS — N2581 Secondary hyperparathyroidism of renal origin: Secondary | ICD-10-CM | POA: Diagnosis not present

## 2017-12-16 DIAGNOSIS — E876 Hypokalemia: Secondary | ICD-10-CM | POA: Diagnosis not present

## 2017-12-16 DIAGNOSIS — E119 Type 2 diabetes mellitus without complications: Secondary | ICD-10-CM | POA: Diagnosis not present

## 2017-12-16 DIAGNOSIS — N186 End stage renal disease: Secondary | ICD-10-CM | POA: Diagnosis not present

## 2017-12-16 DIAGNOSIS — D631 Anemia in chronic kidney disease: Secondary | ICD-10-CM | POA: Diagnosis not present

## 2017-12-16 DIAGNOSIS — Z992 Dependence on renal dialysis: Secondary | ICD-10-CM | POA: Diagnosis not present

## 2017-12-16 NOTE — Patient Outreach (Signed)
[facility] [facility]) Care Management [place] Telephone Outreach, Transition of Care day 1  12/16/2017  Michael [name] April 23, 1961 [id number]  3:40 pm: Placed call to [facility] interpreter services ([phone])/ [name] interpreter "[place]ane (?) spelling" (ID-- [phone])   Successful telephone outreach to [facility], previously active with [place] CM, re- referred to [place] for transition of care after 2 recent hospitalizations:  January 7-10, 2019 for shortness of breath, dyspnea, pleural effusion/ pulmonary edema/ volume overload January 15-28, 2019 for chest pain/ discomfort, cough, tachycardia; patient diagnosed with SIRS and pleural effusion requiring thoracentesis  Patient was discharged home to self-care on December 14, 2017 with home health [facility]) services in place through [place] [facility]) for RN/ PT/OT.  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  Today, through interpreter, patient reports that he is currently at hemodialysis; patient denies pain, concerns/ problems.  Patient sounds to be in no obvious/ apparent distress throughout brief phone call today.  Discussed [facility] CM services with patient, patient provides verbal consent for [facility] CM involvement in his care.  Noted that patient has signed written consent for [facility] CM services (10/06/17-- uploaded in EMR 10/13/17).  Patient further reports:  Medications: -- Has all medicationsand is taking as prescribed post-hospital discharge;denies questions about current medications; acknowledges that he has spoken with [facility] Pharmacist [name] about medication concerns/ instructions for filling of pill box   Home health [facility]) services: -- [place] services for PT, OT, RN in place through [place]; reports today that his roommate [name]  has spoken to [facility] staff and that [facility] visit is planned for tomorrow for initiation of [place] services; patient unsure which [facility] discipline will be visiting tomorrow  Provider appointments: -- reports that he "missed" today's scheduled coumadin clinic visit, due to previously established HD session today; reports that roommmate [name] has re-scheduled this visit for "tomorrow" but he is unsure what time the visit is  Discussed with patient that I could arrange my schedule to visit with him at his home with [place] next week, and patient stated that would be fine with him.  Patient denies further issues, concerns, or problems today.  Patient declined need/ want for me to schedule interpreter to attend home visit on Tuesday of next week, stating that his roommate [name] would be there and would translate for him; stressed with patient that his roommate, or someone being present to translate was very important, and he re-confirmed that his roommate [name] would be present and would provide interpretation.  Plan:  Patient will take medications as prescribed and will attend all scheduled provider appointments  Patient will promptly contact care providers for any concerns/ issues/ problems that arise  Patient will actively participate in home health services as ordered post-hospital discharge  I will make patient's PCP aware of [facility] CM involvement in patient's care/ will send barriers letter  [place] outreach for transition of care to continue with initial home visit with [facility] Pharmacist next week, along with [place] Pharmacist  [facility] CM Care Plan Problem One     Most Recent Value  Care Plan Problem One  Risk for hospital readmission related to/ evidenced by 2 recent extended hospitalizations for pleural effusion, A-fib  Role Documenting the Problem One  Care Management Coordinator  Care Plan for Problem One  Active  [facility]  Long Term Goal   Over the next 31 days,  patient will not experience hospital re-admission, as evidenced by patient reporting and review of EMR during [place] outreach  [place] Term Goal Start Date  12/16/17  Interventions for Problem One Long Term Goal  Using interpreter phone line, discussed with patient current clinical status,  initiation of home health services,  discussed [place] CM role, and initiated TOC program,  scheduled [facility] CCM initial home visit for next week  [facility] CM Short Term Goal #1   Over the next 30 days, patient will actively participate in home health services as prdered post-hospital discharge, as evidenced by patient/ caregiver reporting and collaboration as indicated with [facility] staff during [place] outreach  [facility] CM Short Term Goal #1 Start Date  12/16/17  Interventions for Short Term Goal #1  Discussed with patient current understanding of home health services and confirmed that patient expects visit from home health staff tomorrow,  encouraged patient to actively participate in [facility] services as ordered     I appreciate the opportunity to participate in Rod[place]'s care,  Oneta Rack, RN, BSN, [employer] Coordinator [facility] Care Management  [phone]

## 2017-12-17 ENCOUNTER — Ambulatory Visit (INDEPENDENT_AMBULATORY_CARE_PROVIDER_SITE_OTHER): Payer: Medicare Other | Admitting: *Deleted

## 2017-12-17 ENCOUNTER — Other Ambulatory Visit: Payer: Self-pay | Admitting: *Deleted

## 2017-12-17 DIAGNOSIS — N186 End stage renal disease: Secondary | ICD-10-CM | POA: Diagnosis not present

## 2017-12-17 DIAGNOSIS — Z5181 Encounter for therapeutic drug level monitoring: Secondary | ICD-10-CM

## 2017-12-17 DIAGNOSIS — I059 Rheumatic mitral valve disease, unspecified: Secondary | ICD-10-CM

## 2017-12-17 DIAGNOSIS — I079 Rheumatic tricuspid valve disease, unspecified: Secondary | ICD-10-CM | POA: Diagnosis not present

## 2017-12-17 DIAGNOSIS — I509 Heart failure, unspecified: Secondary | ICD-10-CM | POA: Diagnosis not present

## 2017-12-17 DIAGNOSIS — Z992 Dependence on renal dialysis: Secondary | ICD-10-CM | POA: Diagnosis not present

## 2017-12-17 LAB — POCT INR: INR: 3

## 2017-12-17 NOTE — Patient Outreach (Signed)
[facility]) Care Management         [facility] CM Multidisciplinary Case Discussion 12/17/2017  [place] Hovanec 04/12/1961 [id number]  [facility] CM Multidisciplinary Case Discussion re: Lynford Amir, previously active with [place] CM, re- [place] for[place] of careafter 2 recent hospitalizations:  January 7-10, 2081for shortness of breath, dyspnea, pleural effusion/ pulmonary edema/ volume overload January 15-28, 2075for chest pain/ discomfort, cough, tachycardia; patient diagnosed with SIRS and pleural effusion requiring thoracentesis  Patient was discharged home to self-care on December 14, 2017 with home health [facility]) services in place through [facility]) for RN/ PT/OT.  Patient has history including, but not limited to CKD requiring hemodialysis M-W-F, CADwith valvular disease and previous CABG, HTN, A-Fib, CHF with cardiomyopathy, DM, liver cirrhosis, and GERD.  Barriers: -- Multiple frequent hospitalizations; 7 un-planned inpatient hospitalizations since July 2018, some of which were extended admissions > 10 days -- multiple chronic co-morbidities -- new recent initiation of hemodialysis (September 2018); poor toleration of HD with frequent complications during treatment -- language barrier with frequent patient denial of use of translator  -- medication adherence/ management -- overall adherence to plan of care in general -- lack of patient engagement in overall plan of care with frequent cancelled/ missed appointments with care team   [facility] CM Team Discussion:  Language barrier present in patient that refuses/ declines need for translator and does not appear engaged in overall plan of care; use of translator necessary, and to be used separate from using patient's friends/ family, as indicated around future patient outreaches.  Patient's lack of engagement problematic; clear expectations/ boundaries around [facility] CM  involvement in patient's care to be discussed with patient.  Future patient conversations should include goal setting, possibly best managed by palliative care; patient may be best followed by attending HD/ provider appointments.  Plan:  [place] initial home visit with [place] and Pharmacist next week, with updated case discussion to follow after completion of initial home visit with patient  Oneta Rack, RN, BSN, [place] Coordinator [facility] Care Management  [phone]

## 2017-12-17 NOTE — Patient Instructions (Signed)
Description   Today take 1/2 tablets then start taking 1 tablet (2.5mg ) daily except 1.5 tablets (3.75mg ) on Wednesdays and Sundays. Recheck INR in 1 week.

## 2017-12-18 DIAGNOSIS — I509 Heart failure, unspecified: Secondary | ICD-10-CM | POA: Diagnosis not present

## 2017-12-18 DIAGNOSIS — N186 End stage renal disease: Secondary | ICD-10-CM | POA: Diagnosis not present

## 2017-12-18 DIAGNOSIS — Z992 Dependence on renal dialysis: Secondary | ICD-10-CM | POA: Diagnosis not present

## 2017-12-18 LAB — CULTURE, BODY FLUID-BOTTLE: CULTURE: NO GROWTH

## 2017-12-18 LAB — CULTURE, BODY FLUID W GRAM STAIN -BOTTLE

## 2017-12-19 DIAGNOSIS — N186 End stage renal disease: Secondary | ICD-10-CM | POA: Diagnosis not present

## 2017-12-19 DIAGNOSIS — D509 Iron deficiency anemia, unspecified: Secondary | ICD-10-CM | POA: Diagnosis not present

## 2017-12-19 DIAGNOSIS — N2581 Secondary hyperparathyroidism of renal origin: Secondary | ICD-10-CM | POA: Diagnosis not present

## 2017-12-19 DIAGNOSIS — D631 Anemia in chronic kidney disease: Secondary | ICD-10-CM | POA: Diagnosis not present

## 2017-12-19 DIAGNOSIS — I1310 Hypertensive heart and chronic kidney disease without heart failure, with stage 1 through stage 4 chronic kidney disease, or unspecified chronic kidney disease: Secondary | ICD-10-CM | POA: Diagnosis not present

## 2017-12-19 DIAGNOSIS — I5022 Chronic systolic (congestive) heart failure: Secondary | ICD-10-CM | POA: Diagnosis not present

## 2017-12-19 DIAGNOSIS — E876 Hypokalemia: Secondary | ICD-10-CM | POA: Diagnosis not present

## 2017-12-19 DIAGNOSIS — I132 Hypertensive heart and chronic kidney disease with heart failure and with stage 5 chronic kidney disease, or end stage renal disease: Secondary | ICD-10-CM | POA: Diagnosis not present

## 2017-12-19 DIAGNOSIS — Z992 Dependence on renal dialysis: Secondary | ICD-10-CM | POA: Diagnosis not present

## 2017-12-19 DIAGNOSIS — I509 Heart failure, unspecified: Secondary | ICD-10-CM | POA: Diagnosis not present

## 2017-12-19 DIAGNOSIS — E119 Type 2 diabetes mellitus without complications: Secondary | ICD-10-CM | POA: Diagnosis not present

## 2017-12-19 DIAGNOSIS — I50810 Right heart failure, unspecified: Secondary | ICD-10-CM | POA: Diagnosis not present

## 2017-12-20 DIAGNOSIS — J90 Pleural effusion, not elsewhere classified: Secondary | ICD-10-CM | POA: Diagnosis not present

## 2017-12-20 DIAGNOSIS — E1122 Type 2 diabetes mellitus with diabetic chronic kidney disease: Secondary | ICD-10-CM | POA: Diagnosis not present

## 2017-12-20 DIAGNOSIS — I2510 Atherosclerotic heart disease of native coronary artery without angina pectoris: Secondary | ICD-10-CM | POA: Diagnosis not present

## 2017-12-20 DIAGNOSIS — Z9981 Dependence on supplemental oxygen: Secondary | ICD-10-CM | POA: Diagnosis not present

## 2017-12-20 DIAGNOSIS — G4733 Obstructive sleep apnea (adult) (pediatric): Secondary | ICD-10-CM | POA: Diagnosis not present

## 2017-12-20 DIAGNOSIS — Z7901 Long term (current) use of anticoagulants: Secondary | ICD-10-CM | POA: Diagnosis not present

## 2017-12-20 DIAGNOSIS — I480 Paroxysmal atrial fibrillation: Secondary | ICD-10-CM | POA: Diagnosis not present

## 2017-12-20 DIAGNOSIS — D6959 Other secondary thrombocytopenia: Secondary | ICD-10-CM | POA: Diagnosis not present

## 2017-12-20 DIAGNOSIS — E669 Obesity, unspecified: Secondary | ICD-10-CM | POA: Diagnosis not present

## 2017-12-20 DIAGNOSIS — N186 End stage renal disease: Secondary | ICD-10-CM | POA: Diagnosis not present

## 2017-12-20 DIAGNOSIS — D631 Anemia in chronic kidney disease: Secondary | ICD-10-CM | POA: Diagnosis not present

## 2017-12-20 DIAGNOSIS — K219 Gastro-esophageal reflux disease without esophagitis: Secondary | ICD-10-CM | POA: Diagnosis not present

## 2017-12-20 DIAGNOSIS — Z992 Dependence on renal dialysis: Secondary | ICD-10-CM | POA: Diagnosis not present

## 2017-12-20 DIAGNOSIS — I5022 Chronic systolic (congestive) heart failure: Secondary | ICD-10-CM | POA: Diagnosis not present

## 2017-12-20 DIAGNOSIS — I272 Pulmonary hypertension, unspecified: Secondary | ICD-10-CM | POA: Diagnosis not present

## 2017-12-20 DIAGNOSIS — Z952 Presence of prosthetic heart valve: Secondary | ICD-10-CM | POA: Diagnosis not present

## 2017-12-20 DIAGNOSIS — K7460 Unspecified cirrhosis of liver: Secondary | ICD-10-CM | POA: Diagnosis not present

## 2017-12-20 DIAGNOSIS — I132 Hypertensive heart and chronic kidney disease with heart failure and with stage 5 chronic kidney disease, or end stage renal disease: Secondary | ICD-10-CM | POA: Diagnosis not present

## 2017-12-21 ENCOUNTER — Telehealth: Payer: Self-pay

## 2017-12-21 DIAGNOSIS — E119 Type 2 diabetes mellitus without complications: Secondary | ICD-10-CM | POA: Diagnosis not present

## 2017-12-21 DIAGNOSIS — I482 Chronic atrial fibrillation: Secondary | ICD-10-CM | POA: Diagnosis not present

## 2017-12-21 DIAGNOSIS — D631 Anemia in chronic kidney disease: Secondary | ICD-10-CM | POA: Diagnosis not present

## 2017-12-21 DIAGNOSIS — Z992 Dependence on renal dialysis: Secondary | ICD-10-CM | POA: Diagnosis not present

## 2017-12-21 DIAGNOSIS — E876 Hypokalemia: Secondary | ICD-10-CM | POA: Diagnosis not present

## 2017-12-21 DIAGNOSIS — N186 End stage renal disease: Secondary | ICD-10-CM | POA: Diagnosis not present

## 2017-12-21 DIAGNOSIS — D509 Iron deficiency anemia, unspecified: Secondary | ICD-10-CM | POA: Diagnosis not present

## 2017-12-21 LAB — PROTIME-INR: INR: 1.8 — AB (ref <=1.1)

## 2017-12-21 NOTE — Telephone Encounter (Signed)
Call placed to the patient to discuss scheduling a hospital follow up appointment.  He was at dialysis at the time of the call.  CM to call back tomorrow to speak to patient

## 2017-12-22 ENCOUNTER — Telehealth: Payer: Self-pay

## 2017-12-22 ENCOUNTER — Other Ambulatory Visit: Payer: Self-pay | Admitting: Pharmacist

## 2017-12-22 ENCOUNTER — Encounter: Payer: Self-pay | Admitting: *Deleted

## 2017-12-22 ENCOUNTER — Other Ambulatory Visit: Payer: Self-pay | Admitting: *Deleted

## 2017-12-22 DIAGNOSIS — N186 End stage renal disease: Secondary | ICD-10-CM | POA: Diagnosis not present

## 2017-12-22 DIAGNOSIS — I132 Hypertensive heart and chronic kidney disease with heart failure and with stage 5 chronic kidney disease, or end stage renal disease: Secondary | ICD-10-CM | POA: Diagnosis not present

## 2017-12-22 DIAGNOSIS — I2510 Atherosclerotic heart disease of native coronary artery without angina pectoris: Secondary | ICD-10-CM | POA: Diagnosis not present

## 2017-12-22 DIAGNOSIS — E1122 Type 2 diabetes mellitus with diabetic chronic kidney disease: Secondary | ICD-10-CM | POA: Diagnosis not present

## 2017-12-22 DIAGNOSIS — I5022 Chronic systolic (congestive) heart failure: Secondary | ICD-10-CM | POA: Diagnosis not present

## 2017-12-22 DIAGNOSIS — J90 Pleural effusion, not elsewhere classified: Secondary | ICD-10-CM | POA: Diagnosis not present

## 2017-12-22 NOTE — Telephone Encounter (Signed)
Call placed to the patient and scheduled a hospital follow up appointment for 12/24/17 @ 0945. He stated that it was fine to schedule an appointment that day even though he has an appointment with the coumadin clinic that afternoon.   His nurse from [facility] was with him at the time of the call.

## 2017-12-22 NOTE — Patient Outreach (Signed)
[name]) Care Management  [place]   12/22/2017  Spence Landess 11-15-1961 [id number]  Subjective:  Patient was initially referred to [place] Pharmacist 09/2017.  Multiple prior attempts were made to complete home visit, including a no show by patient.   Home visit was completed today with [facility] RN Richarda Osmond.  Interpreter from [employer], [place], was utilized via phone to assist with language barrier.    Patient has a past medical history significant for hypertension, atrial fibrillation (on oral anti-coagulation with warfarin), liver cirrhosis, end stage renal disease on dialysis.   He reports he gets dialysis 4x per week, Mon, Wed, Fri, Sat at [facility] dialysis center---he reports he does not know who his nephrologist is.    Patient reports his friend/roommate, [name], fills his pill box.  He reports she will continue filling his pill box for him---[name] confirms this during home visit.  Patient added [name] to his [facility] Consent form during home visit today.    Objective:   Encounter Medications: Outpatient Encounter Medications as of 12/22/2017  Medication Sig Note  . acetaminophen (TYLENOL) 325 MG tablet Take 650 mg by mouth every 6 (six) hours.    Marland Kitchen allopurinol (ZYLOPRIM) 300 MG tablet Take 0.5 tablets (150 mg total) by mouth daily. (Patient taking differently: Take 150 mg by mouth at bedtime. ) 11/30/2017: #15 filled 10/06/17 [facility] per external pharmacy records  . atorvastatin (LIPITOR) 20 MG tablet Take 1 tablet (20 mg total) by mouth daily. 11/30/2017: #30 filled 10/06/17 [facility] per external pharmacy records  . benzonatate (TESSALON) 100 MG capsule Take 1 capsule (100 mg total) by mouth 3 (three) times daily.   . calcium acetate (PHOSLO) 667 MG capsule Take 1 capsule (667 mg total) by mouth 3 (three) times daily with meals. (Patient taking differently: Take 667-2,001 mg by mouth See admin instructions. Pt takes 2,001mg  by mouth three  times daily and 667mg  twice daily with snacks) 11/30/2017: #90 filled 10/06/17 [facility] per external pharmacy records  . colchicine 0.6 MG tablet Take 0.5 tablets (0.3 mg total) by mouth 2 (two) times a week. (Patient taking differently: Take 0.3 mg by mouth daily as needed. ) 11/30/2017: #30 filled 11/11/17 [facility] per external pharmacy records  . fluticasone (FLONASE) 50 MCG/ACT nasal spray Place 2 sprays into both nostrils daily.   . folic acid (FOLVITE) 1 MG tablet TAKE 1 TABLET BY MOUTH DAILY. (Patient taking differently: TAKE 1 TABLET (1mg ) BY MOUTH DAILY.) 11/30/2017: #30 filled 11/12/17 [facility] per external pharmacy records  . hydrOXYzine (ATARAX/VISTARIL) 25 MG tablet 1/2-1 tablets tid prn itching (Patient taking differently: Take 12.5-25 mg by mouth 3 (three) times daily as needed for itching. ) 11/30/2017: #30 [facility]19/17 [facility]  . metoprolol tartrate (LOPRESSOR) 25 MG tablet Take 0.5 tablets (12.5 mg total) by mouth 2 (two) times daily.   . midodrine (PROAMATINE) 5 MG tablet Take 2 tablets (10 mg total) by mouth 3 (three) times daily with meals. (Patient taking differently: Take 5 mg by mouth 3 (three) times daily. Pt takes 10mg  by mouth three times per week-10mg  by mouth one hour prior to dialysis and 10mg  by mouth after the start of dialysis) 11/30/2017: #90 filled 11/11/17 [facility] per external pharmacy records  . multivitamin (RENA-VIT) TABS tablet Take 1 tablet by mouth at bedtime.   Marland Kitchen omeprazole (PRILOSEC) 20 MG capsule Take 1 capsule (20 mg total) by mouth daily. 11/30/2017: #90 filled 11/11/17 [facility] per external pharmacy records  . oxyCODONE (OXY IR/ROXICODONE)  5 MG immediate release tablet Take 5 mg by mouth every 8 (eight) hours as needed for pain. 11/30/2017: #15 [facility]17 [facility] per PMP AWARE  . OXYGEN Inhale 2 L into the lungs daily as needed.    Marland Kitchen tiZANidine (ZANAFLEX) 4 MG tablet Take 1 tablet (4 mg total) by mouth every 8 (eight) hours as needed for muscle spasms.   Marland Kitchen warfarin (COUMADIN) 2.5 MG  tablet Take 2 tablets today and tomorrow, then start taking 1 tablet (2.5mg ) daily except 1.5 tablets (3.75mg ) on Wednesdays and Saturdays and then as directed by coumadin clinic    No facility-administered encounter medications on file as of 12/22/2017.     Functional Status: In your present state of health, do you have any difficulty performing the following activities: 12/22/2017 12/02/2017  Hearing? N N  Vision? N N  Difficulty concentrating or making decisions? N N  Walking or climbing stairs? Y Y  Dressing or bathing? N N  Doing errands, shopping? Y Y  Comment Uses [name] for appointments; room mate assists with transportation for errands -  Preparing Food and eating ? N -  Using the Toilet? N -  In the past six months, have you accidently leaked urine? N -  Do you have problems with loss of bowel control? N -  Managing your Medications? Y -  Comment Reports room mate prepares pill box; he then takes independently -  Managing your Finances? N -  Housekeeping or managing your Housekeeping? Y -  Comment Room mate assists -  Some recent data might be hidden    Fall/Depression Screening: Fall Risk  12/22/2017 08/04/2017 07/31/2017  Falls in the past year? No Yes Yes  Comment Patient denies history of/ recent falls today - -  Number falls in past yr: - - 2 or more  Injury with Fall? - - Yes  Risk Factor Category  - - High Fall Risk  Risk for fall due to : Medication side effect - History of fall(s);Impaired balance/gait;Impaired mobility  Follow up - - Education provided;Falls prevention discussed   PHQ 2/9 Scores 12/22/2017 10/13/2017 07/31/2017 06/25/2017 03/03/2017 12/12/2016 04/22/2016  PHQ - 2 Score 0 0 4 4 0 0 0  PHQ- 9 Score - 0 7 15 - 2 -      Assessment:  Medication review per prescription bottles in patient's possession and review of medication list in this chart as well as 12/14/17 discharge summary.    Drugs sorted by system:  Cardiovascular: -atorvastatin---last filled  09/2017 #30  -metoprolol tartrate---patient reports taking 1 tab daily -midodrine---patient reports taking 1 tab three times daily  -warfarin   Pulmonary/Allergy: -fluticasone nasal spray   Gastrointestinal: -omeprazole   Endocrine: -allopurinol---last filled 09/2017 #15  -colchicine---patient reports not taking   Renal: -calcium acetate---patient reports taking 1 cap three times daily with meals   Pain: -acetaminophen as needed -oxycodone---patient did not show bottle during home visit  -tizanidine as needed---patient reports not taking   Vitamins/Minerals: -folic acid -renal vitamin---patient not currently taking   Miscellaneous: -benzonatate  -hydroxyzine as needed    Drug interactions:   -colchicine and atorvastatin----increased risk of myopathy  Other issues noted:   1) Patient taking incorrect dose of metoprolol tartrate and midodrine---his blood pressure taken by [facility] RN during home visit was 92/62 mmHg sitting---recommend provider clarify dose of these medications with patient during appoint.    2) Patient not taking colchicine---recommend clarifying if patient needs to takes this medication.    3) Patient counseled to  take all medications as prescribed by his medical providers.   4) Call placed to dialysis center to verify calcium acetate dosing---per Levada Dy at [facility], patient to take 1 capsule three times daily with meal and 1 capsule with snacks (up to 5 capsules daily)---informed Angela patient only taking 1 capsule three times daily.    Medication management: Discussed need for proper pill box filling each week for patient---patient and [name] state she will be filling pill box.   Patient/[name] asked about pharmacy delivery---discussed some local pharmacies may offer delivery, but they would need to ask and know there may be a fee charged by the pharmacy---[name] plans to contact a local pharmacy near patient's residence.      Plan:  Note will be routed to PCP to request PCP clarify patient's metoprolol tartrate and midodrine at follow-up appointment.   Will place follow-up call to patient in the next 2-3 weeks.    [name], PharmD, [place] [phone]

## 2017-12-22 NOTE — Patient Outreach (Addendum)
Michael [name]) Care Management  [name] Initial Home Visit, Transition of Care day 7  12/22/2017  Michael [name] 11/02/1961 [id number]  Michael [name] is a 57 y.o. male previously active with [name], re- referred to [name] fortransition of careafter 2 recent hospitalizations:  January 7-10, 2040for shortness of breath, dyspnea, pleural effusion/ pulmonary edema/ volume overload January 15-28, 2037for chest pain/ discomfort, cough, tachycardia, AF with RVR; patient diagnosed with SIRS and pleural effusion requiring thoracentesis  Patient was discharged home to self-care on December 14, 2017 with home health [name]) services in [name] through [name]) for RN/ PT/OT.  Patient has history including, but not limited to CKD requiring hemodialysis M-W-F, CADwith valvular disease and previous CABG, HTN, A-Fib, CHF with cardiomyopathy, DM, liver cirrhosis, and GERD.  09:55 am: Placed call to [facility] interpreter services ([phone])/ [name] interpreter "Siree" (ID-- [phone]).  Interpreter participated in entire 80 minute home visit with phone on speaker mode; joint home visit with [facility] Pharmacist [name]   Today, through interpreter, patient reports that he is "fine," and he denies pain, concerns/ problems.  Patient is in no obvious/ apparent distress throughout entirety of home visit today.  Again discussed/ reiterated [name] services with patient, and today, patient provides updated written consent for [place] CM involvement in his care, to include his room mate [name] on [facility] CM written consent.  During last 20 minutes of home visit, patient's room mate Michael [name] (on [facility] CM written consent) is present and actively participates in visit.   Subjective: "I don't know how you can help me, or what I want out of my health care.  I just want my medicines to be right."  Assessment:  Michael [name] appears to be  recuperating fairly well after his recent hospitalizations, however, he appears to lack any solid awareness of his own personal/ current health conditions and multiple chronic conditions.  Although he is able to speak some English and communicate on a minimal level, Michael [name] has a language/ cultural barrier that appears to impede his understanding of the gravity of his multiple chronic health conditions.  Additionally, Ekansh appears to have a lack of personal engagement in his overall care needs/ planning.  Michael [name] currently denies community resource needs, as he actively uses [name] transportation for all provider appointments and has limited assistance for other care needs from his daughter and his room mate.  Michael [name] could benefit from ongoing education around self-health management of multiple chronic conditions, however, if he remains unengaged in his personal care needs/ planning, this potential benefit may be limited from a long-term standpoint.    Patient further reports:  Medications: -- Has all medicationsand takes as prescribed;however, during medication reconciliation by [name] Pharmacist [name] today, several discrepancies in medication dosing were noted from last hospital discharge instructions-- please see [name] home visit note form 12/22/17 for additional details  -- patient is unable to verbalizes a general understanding of the purpose, dosing, and scheduling of medications; states that his room mate Michael [name] prepares his medications using pill box and he then takes medications independently.   -- patient was recently discharged from the hospital and all medications were thoroughly reviewed with patient today by [name]; please see Michael [name]'s notes for additional details around patient's medications  Home health [facility]) services: -- [name] services for PT, OT, RN in [name], although patient is unable to tell me which agency/ disciplines are involved -- confirms that [name] services  have begun; reports [name] staff visited him "last week," reports he is unsure when next visit is scheduled -- re-explained difference between [name] and [name] services, and encouraged patient to actively participate in both services   Provider appointments: -- All upcoming provider appointments were reviewed with patient today -- During home visit today, patient's PCP office called to schedule hospital follow up office visit; this was scheduled for Friday 12/25/17 at 09:45; patient accepted appointment and stated he would attend -- post-hospital discharge CXR scheduled for Thursday 12/24/17 at 2:00 pm; patient has this appointment on a slip of paper. -- patient scheduled for coumadin clinic appointment after CXR on 12/24/17 at 3:00 pm; patient states he will arrange transportation to all appointments on Thurs 12/24/17 -- patient is unable to tell [facility] [name] staff who is HD/ renal providers are, states he "can not remember"  Safety/ Mobility/ Falls: -- denies history of/ or new/ recent falls -- assistive devices: reports has a cane, but "does not need" to use -- gait steady and purposeful during home visit today, although patient ambulated minimally during home visit -- no obvious/ overt fall hazards/ risks noted in patient's home environment  -- general fall risks/ prevention education discussed with patient today  Holiday representative needs: -- currently denies community resource needs, stating supportive room mate/ daughter that assist with care needs as indicated, although both are limited in ability to assist per room mate's report today -- uses [name], which is established as patient's primary transportation for patient to all provider appointments  Advanced Directive (AD) Planning:   --patient initially reported that he does not currently have exisisting AD living will/ HCPOA in [name], and a considerable amount of discussion/ education with patient was initiated around value and importance  of same.  Patient stated several times that his wishes were "whatever his daughter thinks is right" if he were to require life support/ resuscitation -- after discussion/ education was initiated with patient, review of EMR revealed that patient had created Living Will during last hospitalization, and this was verified in EMR (upload date 12/15/17) -- when patient's room mate Michael [name] arrived at the visit, she further provided copy of patient's HCPOA, which were also created during patient's recent hospitalization; patient has designated his HCPOA as his daughter [name] -- throughout our discussion around AD's patient did not seem to recall these documents ever being created, however, today, he declines need/ desire to make changes in either Living will or HCPOA today, once these documents were shared with him.  Self-health management of multiple chronic disease states, including CKD, CHF, CAD, AF, and DM: -- Patient does not endorse following any specific diet -- Patient states that he "tries" to attend all scheduled HD sessions; does not perform weights at home, as he attends HD 4 x/ week -- noted that patient had several cigarettes in open view lying on his bedside table, however, he denies that these cigarettes are his; patient provides no explanation of whose cigarettes they are; counseled patient on dangers of smoking with O2 in home, and he stated, "no one smokes in the house." -- reports does not have to use home O2 "very often," stating, "it is here only for when I need it."  Reports has not needed recently -- patient unable to verbalize signs/ symptoms concerning for MI/ CVA, need to contact emergency services:  These were discussed thoroughly with patient and his room mate today -- patient unable to tell me what he thinks his biggest  current health issue is; states, "I don't know." -- encouraged patient to be thinking of his personal goals around his health care/ self-health management  of multiple chronic conditions, however, patient is unable to think of any goals or specific knowledge deficits today around his personal health care, and verbalizes that he just wants help with managing his medications.  Patient denies further issues, concerns, or problems today. Reiterated with patient and his room mate need for use of interpreter with all future [facility] Community CM outreach attempts, for clarification of communication and maximum benefit of [facility] CM services; patient/ room mate agreeable to ongoing use of interpreter with future outreach.  Explained and discussed with patient importance of maintaining contact with [facility] RN CCM, and patient agrees today to maintain contact with [facility] RN CCM.  I provided/ confirmed that patient has my direct phone number, the main [facility] CM office phone number, and the [facility] CM 24-hour nurse advice phone number should issues arise prior to next scheduled [place] outreach by phone next week.  Objective:    BP 92/62   Pulse 94   Resp 16   SpO2 97% Comment: on RA  Review of Systems  Constitutional: Negative.  Negative for malaise/fatigue and weight loss.  Respiratory: Negative.  Negative for cough, shortness of breath and wheezing.        Home O2 present in patient's home via oxygenator; patient does not wear throughout entirety of today's visit  Cardiovascular: Negative.  Negative for chest pain, palpitations and leg swelling.  Gastrointestinal: Negative.  Negative for abdominal pain and nausea.  Genitourinary: Negative.        Reports on HD 4 x/ week  Musculoskeletal: Negative.  Negative for falls.       Reports occasional back and foot pain; denies today  Neurological: Negative.  Negative for dizziness, tingling and weakness.  Endo/Heme/Allergies: Bruises/bleeds easily.       On chronic ACT  Psychiatric/Behavioral: Negative.  Negative for depression. The patient is not nervous/anxious.    Physical Exam  Constitutional: He is oriented to  person, [name], and time. He appears well-developed and well-nourished. No distress.    Cardiovascular: Regular rhythm and intact distal pulses. Tachycardia present.  Pulses:      Radial pulses are 2+ on the right side, and 2+ on the left side.       Dorsalis pedis pulses are 2+ on the right side, and 2+ on the left side.  Respiratory: Effort normal and breath sounds normal. No respiratory distress. He has no wheezes. He has no rales.  GI: Soft. Bowel sounds are normal.  Musculoskeletal: He exhibits no edema.  Neurological: He is alert and oriented to person, [name], and time.  Skin: Skin is warm and dry. No erythema.  Psychiatric: He has a normal mood and affect. His behavior is normal.   Encounter Medications:   Outpatient Encounter Medications as of 12/22/2017  Medication Sig Note  . acetaminophen (TYLENOL) 325 MG tablet Take 650 mg by mouth every 6 (six) hours.    Marland Kitchen allopurinol (ZYLOPRIM) 300 MG tablet Take 0.5 tablets (150 mg total) by mouth daily. (Patient taking differently: Take 150 mg by mouth at bedtime. ) 11/30/2017: #15 filled 10/06/17 [facility] per external pharmacy records  . atorvastatin (LIPITOR) 20 MG tablet Take 1 tablet (20 mg total) by mouth daily. 11/30/2017: #30 filled 10/06/17 [place] [facility] per external pharmacy records  . benzonatate (TESSALON) 100 MG capsule Take 1 capsule (100 mg total) by mouth 3 (three)  times daily.   . calcium acetate (PHOSLO) 667 MG capsule Take 1 capsule (667 mg total) by mouth 3 (three) times daily with meals. (Patient taking differently: Take 667-2,001 mg by mouth See admin instructions. Pt takes 2,001mg  by mouth three times daily and 667mg  twice daily with snacks) 11/30/2017: #90 filled 10/06/17 [facility] per external pharmacy records  . colchicine 0.6 MG tablet Take 0.5 tablets (0.3 mg total) by mouth 2 (two) times a week. (Patient taking differently: Take 0.3 mg by mouth daily as needed. ) 11/30/2017: #30 filled 11/11/17 [facility] per external pharmacy records  .  fluticasone (FLONASE) 50 MCG/ACT nasal spray [name] 2 sprays into both nostrils daily.   . folic acid (FOLVITE) 1 MG tablet TAKE 1 TABLET BY MOUTH DAILY. (Patient taking differently: TAKE 1 TABLET (1mg ) BY MOUTH DAILY.) 11/30/2017: #30 filled 11/12/17 [facility] per external pharmacy records  . hydrOXYzine (ATARAX/VISTARIL) 25 MG tablet 1/2-1 tablets tid prn itching (Patient taking differently: Take 12.5-25 mg by mouth 3 (three) times daily as needed for itching. ) 11/30/2017: #30 [facility]  . metoprolol tartrate (LOPRESSOR) 25 MG tablet Take 0.5 tablets (12.5 mg total) by mouth 2 (two) times daily.   . midodrine (PROAMATINE) 5 MG tablet Take 2 tablets (10 mg total) by mouth 3 (three) times daily with meals. (Patient taking differently: Take 5 mg by mouth 3 (three) times daily. Pt takes 10mg  by mouth three times per week-10mg  by mouth one hour prior to dialysis and 10mg  by mouth after the start of dialysis) 11/30/2017: #90 filled 11/11/17 [facility] per external pharmacy records  . multivitamin (RENA-VIT) TABS tablet Take 1 tablet by mouth at bedtime.   Marland Kitchen omeprazole (PRILOSEC) 20 MG capsule Take 1 capsule (20 mg total) by mouth daily. 11/30/2017: #90 filled 11/11/17 [facility] per external pharmacy records  . oxyCODONE (OXY IR/ROXICODONE) 5 MG immediate release tablet Take 5 mg by mouth every 8 (eight) hours as needed for pain. 11/30/2017: #15 [facility] per PMP AWARE  . OXYGEN Inhale 2 L into the lungs daily as needed.    Marland Kitchen tiZANidine (ZANAFLEX) 4 MG tablet Take 1 tablet (4 mg total) by mouth every 8 (eight) hours as needed for muscle spasms.   Marland Kitchen warfarin (COUMADIN) 2.5 MG tablet Take 2 tablets today and tomorrow, then start taking 1 tablet (2.5mg ) daily except 1.5 tablets (3.75mg ) on Wednesdays and Saturdays and then as directed by coumadin clinic    No facility-administered encounter medications on file as of 12/22/2017.    Functional Status:   In your present state of health, do you have any difficulty  performing the following activities: 12/22/2017 12/02/2017  Hearing? N N  Vision? N N  Difficulty concentrating or making decisions? N N  Walking or climbing stairs? Y Y  Dressing or bathing? N N  Doing errands, shopping? Y Y  Comment Uses [name] for appointments; room mate assists with transportation for errands -  Preparing Food and eating ? N -  Using the Toilet? N -  In the past six months, have you accidently leaked urine? N -  Do you have problems with loss of bowel control? N -  Managing your Medications? Y -  Comment Reports room mate prepares pill box; he then takes independently -  Managing your Finances? N -  Housekeeping or managing your Housekeeping? Y -  Comment Room mate assists -  Some recent data might be hidden   Fall/Depression Screening:    Fall Risk  12/22/2017 08/04/2017 07/31/2017  Falls in the past year? No Yes Yes  Comment Patient denies history of/ recent falls today - -  Number falls in past yr: - - 2 or more  Injury with Fall? - - Yes  Risk Factor Category  - - High Fall Risk  Risk for fall due to : Medication side effect - History of fall(s);Impaired balance/gait;Impaired mobility  Follow up - - Education provided;Falls prevention discussed   PHQ 2/9 Scores 12/22/2017 10/13/2017 07/31/2017 06/25/2017 03/03/2017 12/12/2016 04/22/2016  PHQ - 2 Score 0 0 4 4 0 0 0  PHQ- 9 Score - 0 7 15 - 2 -   Plan:  Patient will take medications as prescribed and will attend all scheduled provider appointments  Patient will promptly contact care providers for any concerns/ issues/ problems that arise  Patient will actively participate in home health services as ordered post-hospital discharge  Patient will maintain contact with [facility] RN CCM  I will make share today's [facility] CM notes and care plan with patient's PCP   [facility] CM outreach for transition of care to continue with scheduled telephone call next week  [facility] CM Care Plan Problem One     Most Recent  Value  Care Plan Problem One  Risk for hospital readmission related to/ evidenced by 2 recent extended hospitalizations for pleural effusion, A-fib  Role Documenting the Problem One  Care Management Coordinator  Care Plan for Problem One  Active  [facility] Long Term Goal   Over the next 31 days, patient will not experience hospital re-admission, as evidenced by patient reporting and review of EMR during [facility] RN CCM outreach  [facility] Long Term Goal Start Date  12/16/17  Interventions for Problem One Long Term Goal  Using interpreter phone line, completed [place] initial home visit along with [facility] Pharmacist,  discussed with patient current clinical status, completed physical assessment,  discussed home health services currently in [name] and encouraged patient to actively participate in [facility] services as ordered post-hospital discharge,  again discussed [place] role, and how [place] services differ from [facility] services,  education around reasons to contact EMS provided.  Encouraged patient to promptly notify care providers for any new concerns, issues, or problems that arise  [facility] CM Short Term Goal #1   Over the next 30 days, patient will actively participate in home health services as prdered post-hospital discharge, as evidenced by patient/ caregiver reporting and collaboration as indicated with [facility] staff during [place] outreach  [facility] CM Short Term Goal #1 Start Date  12/16/17  Interventions for Short Term Goal #1  Confirmed with patient that home health services are active, and encouraged patient's full participation in [place] CM Short Term Goal #2   Over the next 30 days, patient will attend all scheduled provider appointments, as evidenced by patient reporting and review of EMR during [facility] RN CCM outreach  [facility] CM Short Term Goal #2 Start Date  12/22/17  Interventions for Short Term Goal #2  Discussed with patient and his roommate all upcoming provider appointments and encouraged patient to  attend all appointments as scheduled,  encouraged patient and his room mate to contact providers promptly for any new concerns, issues, problems that arise between provider appointments    [facility] [name] CM Care Plan Problem Two     Most Recent Value  Care Plan Problem Two  Lack of patient engagement in his personal health care conditions and needs, as evidenced by  patient reporting and behavior, indicating same  Role Documenting the Problem Two  Care Management [place] for Problem Two  Active  Interventions for Problem Two Long Term Goal   Discussed with patient his current understanding of his chronic health care conditions, and encouraged patient to think about what his goals around his health care needs are,  discussed importance of maintaining contact with [place] CM, and reiterated role of [facility] Community RN CM and [facility] CCM program  [facility] Long Term Goal  Over the next 60 days, patient will actively participate in [place] program and will maintain contact with [place] CM, as evidenced by active ongoing patient participation in [place] program  [place] Term Goal Start Date  12/22/17     Oneta Rack, RN, BSN, [employer] Coordinator [facility] Care Management  [phone]

## 2017-12-23 ENCOUNTER — Encounter: Payer: Self-pay | Admitting: *Deleted

## 2017-12-23 ENCOUNTER — Ambulatory Visit (INDEPENDENT_AMBULATORY_CARE_PROVIDER_SITE_OTHER): Payer: Medicare Other | Admitting: Interventional Cardiology

## 2017-12-23 DIAGNOSIS — E119 Type 2 diabetes mellitus without complications: Secondary | ICD-10-CM | POA: Diagnosis not present

## 2017-12-23 DIAGNOSIS — N186 End stage renal disease: Secondary | ICD-10-CM | POA: Diagnosis not present

## 2017-12-23 DIAGNOSIS — D509 Iron deficiency anemia, unspecified: Secondary | ICD-10-CM | POA: Diagnosis not present

## 2017-12-23 DIAGNOSIS — I4891 Unspecified atrial fibrillation: Secondary | ICD-10-CM

## 2017-12-23 DIAGNOSIS — E876 Hypokalemia: Secondary | ICD-10-CM | POA: Diagnosis not present

## 2017-12-23 DIAGNOSIS — I059 Rheumatic mitral valve disease, unspecified: Secondary | ICD-10-CM

## 2017-12-23 DIAGNOSIS — I482 Chronic atrial fibrillation, unspecified: Secondary | ICD-10-CM

## 2017-12-23 DIAGNOSIS — Z5181 Encounter for therapeutic drug level monitoring: Secondary | ICD-10-CM | POA: Diagnosis not present

## 2017-12-23 DIAGNOSIS — I079 Rheumatic tricuspid valve disease, unspecified: Secondary | ICD-10-CM

## 2017-12-23 DIAGNOSIS — Z992 Dependence on renal dialysis: Secondary | ICD-10-CM | POA: Diagnosis not present

## 2017-12-23 DIAGNOSIS — D631 Anemia in chronic kidney disease: Secondary | ICD-10-CM | POA: Diagnosis not present

## 2017-12-23 DIAGNOSIS — I4821 Permanent atrial fibrillation: Secondary | ICD-10-CM

## 2017-12-24 ENCOUNTER — Ambulatory Visit: Payer: Medicare Other | Attending: Family Medicine | Admitting: Family Medicine

## 2017-12-24 ENCOUNTER — Encounter: Payer: Self-pay | Admitting: Family Medicine

## 2017-12-24 ENCOUNTER — Ambulatory Visit (INDEPENDENT_AMBULATORY_CARE_PROVIDER_SITE_OTHER): Payer: Medicare Other | Admitting: Pharmacist

## 2017-12-24 ENCOUNTER — Ambulatory Visit
Admission: RE | Admit: 2017-12-24 | Discharge: 2017-12-24 | Disposition: A | Discharge status: Home / Self Care | Payer: Medicare Other | Source: Ambulatory Visit | Attending: Family Medicine | Admitting: Family Medicine

## 2017-12-24 ENCOUNTER — Other Ambulatory Visit: Payer: Self-pay | Admitting: *Deleted

## 2017-12-24 VITALS — BP 95/59 | HR 92 | Temp 98.3°F | Resp 18 | Ht 65.0 in | Wt 140.6 lb

## 2017-12-24 DIAGNOSIS — Z5181 Encounter for therapeutic drug level monitoring: Secondary | ICD-10-CM | POA: Diagnosis not present

## 2017-12-24 DIAGNOSIS — I4891 Unspecified atrial fibrillation: Secondary | ICD-10-CM | POA: Insufficient documentation

## 2017-12-24 DIAGNOSIS — E1122 Type 2 diabetes mellitus with diabetic chronic kidney disease: Secondary | ICD-10-CM | POA: Insufficient documentation

## 2017-12-24 DIAGNOSIS — I079 Rheumatic tricuspid valve disease, unspecified: Secondary | ICD-10-CM | POA: Diagnosis not present

## 2017-12-24 DIAGNOSIS — I059 Rheumatic mitral valve disease, unspecified: Secondary | ICD-10-CM | POA: Diagnosis not present

## 2017-12-24 DIAGNOSIS — I5022 Chronic systolic (congestive) heart failure: Secondary | ICD-10-CM | POA: Diagnosis not present

## 2017-12-24 DIAGNOSIS — M545 Low back pain, unspecified: Secondary | ICD-10-CM

## 2017-12-24 DIAGNOSIS — R5383 Other fatigue: Secondary | ICD-10-CM | POA: Diagnosis not present

## 2017-12-24 DIAGNOSIS — Z992 Dependence on renal dialysis: Secondary | ICD-10-CM

## 2017-12-24 DIAGNOSIS — L299 Pruritus, unspecified: Secondary | ICD-10-CM

## 2017-12-24 DIAGNOSIS — I482 Chronic atrial fibrillation, unspecified: Secondary | ICD-10-CM

## 2017-12-24 DIAGNOSIS — K7460 Unspecified cirrhosis of liver: Secondary | ICD-10-CM | POA: Insufficient documentation

## 2017-12-24 DIAGNOSIS — N186 End stage renal disease: Secondary | ICD-10-CM | POA: Insufficient documentation

## 2017-12-24 DIAGNOSIS — Z952 Presence of prosthetic heart valve: Secondary | ICD-10-CM | POA: Diagnosis not present

## 2017-12-24 DIAGNOSIS — I132 Hypertensive heart and chronic kidney disease with heart failure and with stage 5 chronic kidney disease, or end stage renal disease: Secondary | ICD-10-CM | POA: Insufficient documentation

## 2017-12-24 DIAGNOSIS — I9589 Other hypotension: Secondary | ICD-10-CM | POA: Diagnosis not present

## 2017-12-24 DIAGNOSIS — E785 Hyperlipidemia, unspecified: Secondary | ICD-10-CM | POA: Diagnosis not present

## 2017-12-24 DIAGNOSIS — G8929 Other chronic pain: Secondary | ICD-10-CM | POA: Diagnosis not present

## 2017-12-24 DIAGNOSIS — E118 Type 2 diabetes mellitus with unspecified complications: Secondary | ICD-10-CM | POA: Diagnosis not present

## 2017-12-24 DIAGNOSIS — M47816 Spondylosis without myelopathy or radiculopathy, lumbar region: Secondary | ICD-10-CM | POA: Diagnosis not present

## 2017-12-24 DIAGNOSIS — J90 Pleural effusion, not elsewhere classified: Secondary | ICD-10-CM

## 2017-12-24 DIAGNOSIS — Z7901 Long term (current) use of anticoagulants: Secondary | ICD-10-CM | POA: Insufficient documentation

## 2017-12-24 LAB — POCT INR: INR: 1.8

## 2017-12-24 LAB — GLUCOSE, POCT (MANUAL RESULT ENTRY): POC GLUCOSE: 113 mg/dL — AB (ref 70–99)

## 2017-12-24 MED ORDER — TIZANIDINE HCL 4 MG PO TABS: 4.0000 mg | ORAL_TABLET | Freq: Three times a day (TID) | ORAL | 1 refills | Status: DC | PRN

## 2017-12-24 MED ORDER — HYDROXYZINE HCL 25 MG PO TABS: ORAL_TABLET | ORAL | 1 refills | Status: DC

## 2017-12-24 MED ORDER — TRAMADOL HCL 50 MG PO TABS: 50.0000 mg | ORAL_TABLET | Freq: Two times a day (BID) | ORAL | 2 refills | Status: DC | PRN

## 2017-12-24 NOTE — Patient Instructions (Signed)
Description   Take 1.5 tablets today, then continue taking 1 tablet (2.5mg ) daily except 1.5 tablets (3.75mg ) on Wednesdays and Sundays. Recheck INR in 2 weeks.

## 2017-12-24 NOTE — Progress Notes (Signed)
Subjective:  Patient ID: Michael [name], male    DOB: 1961-08-12  Age: 57 y.o. MRN: [id number]  CC: Hospitalization Follow-up   HPI Michael [name] is a 57 year old male with a history of Type 2DM (A1c 4.9), hypertension, hyperlipidemia, mitral valve replacement with porcine valve, tricuspid valve repair, atrial fibrillation (on anticoagulation with Coumadin and rate control with metoprolol),Chronic systolic heartfailure  (EF 50-55% from 11/2017), Cirrhosis, ESRD who presents today for follow-up from hospitalization at [facility] from 11/30/17 through 12/14/17.  He had presented to the ED with shortness of breath and he was admitted due to concerns of sepsis.  Labs reveal elevated lactic acid, RSV panel was positive for corona virus. Chest x-ray revealed bilateral pleural effusion, mild interstitial edema. He was placed on antibiotics. Pulmonary consult placed and he underwent thoracocentesis. Pleural fluid analysis showed LDH to be 187.  Total protein 3.7.  Serum LDH 224.  Pleural fluid to serum total protein ratio is 0.5.  Pleural fluid to serum LDH ratio was 0.83.  Fluid analysis does suggest exudate.  Cytology revealed no malignancy. He had frequent hemodialysis session with the aim of possibly decreasing his pleural effusion.  Recommendation was for repeat chest x-ray in 2 weeks to assess for resolution of pleural effusion.  He is accompanied to the clinic by his roommate today and complains of generalized fatigue which improves only after he takes a nap.  His appetite has been fairly good.  He also has chronic low back pain which does not radiate.  Back pain is absent at this time but present when he walks. He is due for his repeat chest x-ray today.  Past Medical History:  Diagnosis Date  . Anemia of chronic disease   . Asthma    said he does not know  . Chronic kidney disease (CKD), stage IV (severe) ([place])   . Chronic systolic CHF (congestive heart failure) (HCC)    a. prior EF normal; b. Echo 10/16: Mild LVH, EF 30-35%, mitral valve bioprosthesis present without evidence of stenosis or regurgitation, severe LAE, mild RVE with mild to moderately reduced RVSF, moderate RAE  . Cirrhosis ([place])   . Coronary artery disease   . Diabetes mellitus type 2 in obese ([place])   . Diabetes mellitus without complication ([place])   . Dyspnea   . GERD (gastroesophageal reflux disease)   . H/O tricuspid valve repair 2012  . History of echocardiogram    Echo at [facility] in [facility] 4/12: mod LVH, EF 60-65%, mod LAE, tissue MVR ok, mod TR, mod pulmo HTN with RVSP 48 mmHg  . Hypertension   . Obstructive sleep apnea   . Paroxysmal atrial fibrillation (HCC)    s/p Maze procedure at time of MVR in 2011  . Pulmonary HTN ([place])   . 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 Thrombocytopenia ([place])     Past Surgical History:  Procedure Laterality Date  . AV FISTULA PLACEMENT Left 07/13/2017   Procedure: LEFT ARM ARTERIOVENOUS (AV) FISTULA CREATION;  Surgeon: Waynetta Sandy, MD;  Location: [place];  Service: Vascular;  Laterality: Left;  . BASCILIC VEIN TRANSPOSITION Left 11/03/2017   Procedure: BASILIC VEIN TRANSPOSITION SECOND STAGE;  Surgeon: Waynetta Sandy, MD;  Location: [place];  Service: Vascular;  Laterality: Left;  . CARDIAC SURGERY    . CARDIAC VALVE REPLACEMENT  2011  . CARDIOVERSION N/A 07/09/2017   Procedure: CARDIOVERSION;  Surgeon: Larey Dresser, MD;  Location: [facility] ENDOSCOPY;  Service:  Cardiovascular;  Laterality: N/A;  . COLONOSCOPY N/A 07/16/2017   Procedure: COLONOSCOPY;  Surgeon: Jerene Bears, MD;  Location: [place];  Service: Gastroenterology;  Laterality: N/A;  . ESOPHAGOGASTRODUODENOSCOPY N/A 07/16/2017   Procedure: ESOPHAGOGASTRODUODENOSCOPY (EGD);  Surgeon: Jerene Bears, MD;  Location: [facility] ENDOSCOPY;  Service: Gastroenterology;  Laterality: N/A;  . INSERTION OF DIALYSIS CATHETER Right 07/13/2017   Procedure: INSERTION OF  DIALYSIS CATHETER RIGHT INTERNAL JUGULAR;  Surgeon: Waynetta Sandy, MD;  Location: [place];  Service: Vascular;  Laterality: Right;  . RIGHT HEART CATH N/A 06/29/2017   Procedure: RIGHT HEART CATH;  Surgeon: Larey Dresser, MD;  Location: [place] CV LAB;  Service: Cardiovascular;  Laterality: N/A;  . RIGHT/LEFT HEART CATH AND CORONARY ANGIOGRAPHY N/A 08/19/2017   Procedure: RIGHT/LEFT HEART CATH AND CORONARY ANGIOGRAPHY;  Surgeon: Leonie Man, MD;  Location: [place] CV LAB;  Service: Cardiovascular;  Laterality: N/A;  . TEE WITHOUT CARDIOVERSION N/A 07/09/2017   Procedure: TRANSESOPHAGEAL ECHOCARDIOGRAM (TEE);  Surgeon: Larey Dresser, MD;  Location: [facility] ENDOSCOPY;  Service: Cardiovascular;  Laterality: N/A;    No Known Allergies   Outpatient Medications Prior to Visit  Medication Sig Dispense Refill  . acetaminophen (TYLENOL) 325 MG tablet Take 650 mg by mouth every 6 (six) hours.     Marland Kitchen allopurinol (ZYLOPRIM) 300 MG tablet Take 0.5 tablets (150 mg total) by mouth daily. (Patient taking differently: Take 150 mg by mouth at bedtime. ) 15 tablet 3  . atorvastatin (LIPITOR) 20 MG tablet Take 1 tablet (20 mg total) by mouth daily. 30 tablet 3  . calcium acetate (PHOSLO) 667 MG capsule Take 1 capsule (667 mg total) by mouth 3 (three) times daily with meals. 90 capsule 3  . colchicine 0.6 MG tablet Take 0.5 tablets (0.3 mg total) by mouth 2 (two) times a week. 30 tablet 0  . fluticasone (FLONASE) 50 MCG/ACT nasal spray Place 2 sprays into both nostrils daily. 16 g 0  . folic acid (FOLVITE) 1 MG tablet TAKE 1 TABLET BY MOUTH DAILY. (Patient taking differently: TAKE 1 TABLET (1mg ) BY MOUTH DAILY.) 30 tablet 2  . metoprolol tartrate (LOPRESSOR) 25 MG tablet Take 0.5 tablets (12.5 mg total) by mouth 2 (two) times daily. (Patient taking differently: Take 25 mg by mouth daily. ) 60 tablet 0  . midodrine (PROAMATINE) 5 MG tablet Take 2 tablets (10 mg total) by mouth 3 (three) times  daily with meals. (Patient taking differently: Take 5 mg by mouth 2 (two) times daily with a meal. Pt takes 10mg  by mouth three times per week-10mg  by mouth one hour prior to dialysis and 10mg  by mouth after the start of dialysis) 90 tablet 2  . omeprazole (PRILOSEC) 20 MG capsule Take 1 capsule (20 mg total) by mouth daily. 30 capsule 5  . OXYGEN Inhale 2 L into the lungs daily as needed.     . warfarin (COUMADIN) 2.5 MG tablet Take 2 tablets today and tomorrow, then start taking 1 tablet (2.5mg ) daily except 1.5 tablets (3.75mg ) on Wednesdays and Saturdays and then as directed by coumadin clinic 30 tablet 0  . multivitamin (RENA-VIT) TABS tablet Take 1 tablet by mouth at bedtime. (Patient not taking: Reported on 12/23/2017) 30 tablet 0  . oxyCODONE (OXY IR/ROXICODONE) 5 MG immediate release tablet Take 5 mg by mouth every 8 (eight) hours as needed for pain.    . benzonatate (TESSALON) 100 MG capsule Take 1 capsule (100 mg total) by mouth 3 (three) times  daily. (Patient not taking: Reported on 12/24/2017) 30 capsule 0  . hydrOXYzine (ATARAX/VISTARIL) 25 MG tablet 1/2-1 tablets tid prn itching (Patient not taking: Reported on 12/23/2017) 30 tablet 0  . tiZANidine (ZANAFLEX) 4 MG tablet Take 1 tablet (4 mg total) by mouth every 8 (eight) hours as needed for muscle spasms. (Patient not taking: Reported on 12/23/2017) 60 tablet 1   No facility-administered medications prior to visit.     ROS Review of Systems  Constitutional: Positive for fatigue. Negative for activity change and appetite change.  HENT: Negative for sinus pressure and sore throat.   Eyes: Negative for visual disturbance.  Respiratory: Negative for cough, chest tightness and shortness of breath.   Cardiovascular: Negative for chest pain and leg swelling.  Gastrointestinal: Negative for abdominal distention, abdominal pain, constipation and diarrhea.  Endocrine: Negative.   Genitourinary: Negative for dysuria.  Musculoskeletal: Positive  for back pain. Negative for joint swelling and myalgias.  Skin: Negative for rash.  Allergic/Immunologic: Negative.   Neurological: Negative for weakness, light-headedness and numbness.  Psychiatric/Behavioral: Negative for dysphoric mood and suicidal ideas.    Objective:  BP (!) 95/59 (BP Location: Right Arm, Patient Position: Sitting, Cuff Size: Normal)   Pulse 92   Temp 98.3 F (36.8 C) (Oral)   Resp 18   Ht 5\' 5"  (1.651 m)   Wt 140 lb 9.6 oz (63.8 kg)   SpO2 96%   BMI 23.40 kg/m   BP/Weight 12/24/2017 12/22/2017 06/11/2034  Systolic BP 95 92 98  Diastolic BP 59 62 61  Wt. (Lbs) 140.6 - 130.51  BMI 23.4 - 21.72      Physical Exam  Constitutional: He is oriented to person, place, and time.  Chronically ill looking  Cardiovascular: Normal rate, normal heart sounds and intact distal pulses.  No murmur heard. Pulmonary/Chest: Effort normal and breath sounds normal. He has no wheezes. He has no rales. He exhibits no tenderness.  Abdominal: Soft. Bowel sounds are normal. He exhibits no distension and no mass. There is no tenderness.  Musculoskeletal: Normal range of motion. He exhibits tenderness (slight TTP of lumbar spine).  Left brachiocephalic AV fistula with palpable thrill  Neurological: He is alert and oriented to person, place, and time.  Psychiatric: He has a normal mood and affect.     Lab Results  Component Value Date   HGBA1C 4.9 11/24/2017    Assessment & Plan:   1. Diabetes mellitus with complication ([place]) Diet controlled with A1c of 4.9 Continue dietary management - Glucose (CBG)  2. Chronic midline low back pain without sciatica Uncontrolled Placed on tramadol, advised to apply heat - tiZANidine (ZANAFLEX) 4 MG tablet; Take 1 tablet (4 mg total) by mouth every 8 (eight) hours as needed for muscle spasms.  Dispense: 60 tablet; Refill: 1 - traMADol (ULTRAM) 50 MG tablet; Take 1 tablet (50 mg total) by mouth every 12 (twelve) hours as needed.  Dispense:  60 tablet; Refill: 2 - DG Lumbar Spine Complete; Future  3. Itching - hydrOXYzine (ATARAX/VISTARIL) 25 MG tablet; 1/2-1 tablets tid prn itching  Dispense: 30 tablet; Refill: 1  4. Chronic atrial fibrillation (HCC) Currently on rate control with metoprolol Anticoagulation with Coumadin; last INR was 1.8 on 2/4 INR is managed by the Coumadin clinic  5. Pleural effusion Scheduled for repeat chest x-ray today to evaluate pleural effusion  6. Other fatigue - VITAMIN D 25 Hydroxy (Vit-D Deficiency, Fractures)  7. ESRD (end stage renal disease) on dialysis [facility]) Continue hemodialysis as per  schedule  8. Other specified hypotension Blood pressure is on the low side however he needs metoprolol for his A. fib He is also on midodrine and has been advised to comply with this   Meds ordered this encounter  Medications  . tiZANidine (ZANAFLEX) 4 MG tablet    Sig: Take 1 tablet (4 mg total) by mouth every 8 (eight) hours as needed for muscle spasms.    Dispense:  60 tablet    Refill:  1  . traMADol (ULTRAM) 50 MG tablet    Sig: Take 1 tablet (50 mg total) by mouth every 12 (twelve) hours as needed.    Dispense:  60 tablet    Refill:  2  . hydrOXYzine (ATARAX/VISTARIL) 25 MG tablet    Sig: 1/2-1 tablets tid prn itching    Dispense:  30 tablet    Refill:  1    Follow-up: Return in about 1 month (around 01/21/2018) for follow up on fatigue.   Charlott Rakes MD

## 2017-12-24 NOTE — Patient Outreach (Signed)
[place] [facility]) Care Management         [facility] CM Multidisciplinary Case Discussion  12/24/2017  Read Mcafee 1961-07-25 [id number]  12/24/17 update in RED text  [facility] CM Multidisciplinary Case Discussion re: Michael [name], previously active with [place] CM, re- referred to [place] fortransition of careafter 2 recent hospitalizations:  January 7-10, 2090for shortness of breath, dyspnea, pleural effusion/ pulmonary edema/ volume overload January 15-28, 2061for chest pain/ discomfort, cough, tachycardia; patient diagnosed with SIRS and pleural effusion requiring thoracentesis  Patient was discharged home to self-care on December 14, 2017 with home health [facility]) services in place through [place] [facility] [facility]) for RN/ PT/OT.  Patient has history including, but not limited to CKD requiring hemodialysis M-W-F, CADwith valvular disease and previous CABG, HTN, A-Fib, CHF with cardiomyopathy, DM, liver cirrhosis, and GERD.  Barriers: -- Multiple frequent hospitalizations; 7 un-planned inpatient hospitalizations since July 2018, some of which were extended admissions > 10 days -- multiple chronic co-morbidities -- new recent initiation of hemodialysis (September 2018); poor toleration of HD with frequent complications during treatment -- language barrier with frequent patient denial of use of translator  -- medication adherence/ management -- overall adherence to plan of care in general -- lack of patient engagement in overall plan of care with frequent cancelled/ missed appointments with care team; low health literacy   [facility] CM Team Discussion:  12/24/17:  Discussed outcome of [facility] initial home visit with RN CM and pharmacist; patient unable to verbalize any goals around personal state of health; appears to have low level of health literacy/ understanding of gravity of current state of health/ multiple co-morbidities; patient did not re-call recent creation  of Advanced Directives while hospitalized.  Patient competent.  Established with patient that with all ongoing communication with [facility] CM, interpreter services would be utilized.  Stressed with patient importance of maintaining communication with [place] staff.  12/17/17: Language barrier present in patient that refuses/ declines need for translator and does not appear engaged in overall plan of care; use of translator necessary, and to be used separate from using patient's friends/ family, as indicated around future patient outreaches.  Patient's lack of engagement problematic; clear expectations/ boundaries around [facility] CM involvement in patient's care to be discussed with patient.  Future patient conversations should include goal setting, possibly best managed by palliative care; patient may be best followed by attending HD/ provider appointments.  Plan:  12/24/17: To continue attempts to assist patient in goal setting; improve patient's understanding of his current chronic health conditions; to reach out to patient's newly established HCPOA (daughter) to determine her level of involvement/ understanding of patient's ongoing health issues/ awareness of being listed as patient's HCPOA  12/17/17: [place] initial home visit with [place] and Pharmacist next week, with updated case discussion to follow after completion of initial home visit with patient  Oneta Rack, RN, BSN, [place] Coordinator [facility] Care Management  [phone]

## 2017-12-24 NOTE — Progress Notes (Signed)
Patient is here for a hospital-follow-up.  Patient have his usual back pain and have medications question for PCP.

## 2017-12-25 ENCOUNTER — Other Ambulatory Visit: Payer: Self-pay | Admitting: *Deleted

## 2017-12-25 ENCOUNTER — Telehealth: Payer: Self-pay

## 2017-12-25 DIAGNOSIS — D631 Anemia in chronic kidney disease: Secondary | ICD-10-CM | POA: Diagnosis not present

## 2017-12-25 DIAGNOSIS — Z992 Dependence on renal dialysis: Secondary | ICD-10-CM | POA: Diagnosis not present

## 2017-12-25 DIAGNOSIS — D509 Iron deficiency anemia, unspecified: Secondary | ICD-10-CM | POA: Diagnosis not present

## 2017-12-25 DIAGNOSIS — E119 Type 2 diabetes mellitus without complications: Secondary | ICD-10-CM | POA: Diagnosis not present

## 2017-12-25 DIAGNOSIS — E876 Hypokalemia: Secondary | ICD-10-CM | POA: Diagnosis not present

## 2017-12-25 DIAGNOSIS — N186 End stage renal disease: Secondary | ICD-10-CM | POA: Diagnosis not present

## 2017-12-25 LAB — VITAMIN D 25 HYDROXY (VIT D DEFICIENCY, FRACTURES): Vit D, 25-Hydroxy: 28.7 ng/mL — ABNORMAL LOW (ref 30.0–100.0)

## 2017-12-25 NOTE — Telephone Encounter (Signed)
Message received from [name], [facility] Pharmacist requesting that Dr Margarita Rana clarify the patient's metoprolol tartrate, midodrine and colchicine.   This CM spoke to Dr Margarita Rana and she noted that  these medications were explained to the patient and his caregiver at the visit yesterday.     Update provided to K. Ruedinger.

## 2017-12-25 NOTE — Patient Outreach (Signed)
[facility]) Care Management [place] Telephone Outreach Care Coordination  12/25/2017  Steven Dunavan Feb 05, 1961 [id number]  Unsuccessful telephone outreach to [name], daughter/ HCPOA, on [facility] CM written consent, of Johnatha Kenley, 57 y.o. male previously active with [place], re- referred to [place] fortransition of careafter 2 recent hospitalizations:  January 7-10, 2025for shortness of breath, dyspnea, pleural effusion/ pulmonary edema/ volume overload January 15-28, 2068for chest pain/ discomfort, cough, tachycardia, AF with RVR; patient diagnosed with SIRS and pleural effusion requiring thoracentesis  Patient was discharged home to self-care on December 14, 2017 with home health [facility]) services in place through [place] [facility]) for RN/ PT/OT.  Patient has history including, but not limited to CKD requiring hemodialysis M-W-F, CADwith valvular disease and previous CABG, HTN, A-Fib, CHF with cardiomyopathy, DM, liver cirrhosis, and GERD.  Call placed to patient's HCPOA (verified during [place] home visit on 12/22/17) to coordinate care/ discuss patient's care needs post-hospitalization.  HIPAA compliant voice mail message left for patient's daughter, requesting return call back.  Plan:  Will re-attempt [facility] CM telephone outreach next week if I do not hear back from patient's caregiver first.  Oneta Rack, RN, BSN, [place] Coordinator [facility] Care Management  [phone]

## 2017-12-25 NOTE — Patient Instructions (Signed)
Description   Today take 1/2 tablet then start taking 1 tablet (2.5mg ) daily except 1.5 tablets (3.75mg ) on Wednesdays and Sundays. Recheck INR in 1 week.

## 2017-12-26 DIAGNOSIS — Z992 Dependence on renal dialysis: Secondary | ICD-10-CM | POA: Diagnosis not present

## 2017-12-26 DIAGNOSIS — N186 End stage renal disease: Secondary | ICD-10-CM | POA: Diagnosis not present

## 2017-12-26 DIAGNOSIS — D631 Anemia in chronic kidney disease: Secondary | ICD-10-CM | POA: Diagnosis not present

## 2017-12-26 DIAGNOSIS — E876 Hypokalemia: Secondary | ICD-10-CM | POA: Diagnosis not present

## 2017-12-26 DIAGNOSIS — E119 Type 2 diabetes mellitus without complications: Secondary | ICD-10-CM | POA: Diagnosis not present

## 2017-12-26 DIAGNOSIS — D509 Iron deficiency anemia, unspecified: Secondary | ICD-10-CM | POA: Diagnosis not present

## 2017-12-28 ENCOUNTER — Other Ambulatory Visit: Payer: Self-pay | Admitting: *Deleted

## 2017-12-28 DIAGNOSIS — Z992 Dependence on renal dialysis: Secondary | ICD-10-CM | POA: Diagnosis not present

## 2017-12-28 DIAGNOSIS — N186 End stage renal disease: Secondary | ICD-10-CM | POA: Diagnosis not present

## 2017-12-28 DIAGNOSIS — D631 Anemia in chronic kidney disease: Secondary | ICD-10-CM | POA: Diagnosis not present

## 2017-12-28 DIAGNOSIS — E876 Hypokalemia: Secondary | ICD-10-CM | POA: Diagnosis not present

## 2017-12-28 DIAGNOSIS — I482 Chronic atrial fibrillation: Secondary | ICD-10-CM | POA: Diagnosis not present

## 2017-12-28 DIAGNOSIS — E119 Type 2 diabetes mellitus without complications: Secondary | ICD-10-CM | POA: Diagnosis not present

## 2017-12-28 DIAGNOSIS — D509 Iron deficiency anemia, unspecified: Secondary | ICD-10-CM | POA: Diagnosis not present

## 2017-12-28 LAB — PROTIME-INR: INR: 1.4 — AB (ref 0.9–1.1)

## 2017-12-28 NOTE — Patient Outreach (Signed)
[facility]) Care Management [place] Telephone Outreach Care Coordination  12/28/2017  Michael [name] Mar 30, 1961 [id number]  Unsuccessful telephone outreach to Michael [name], daughter/ HCPOA, on [facility] CM written consent, of Bill Fulgham, 57 y.o.malepreviously active with [facility] CM, re- referred to [place] fortransition of careafter 2 recent hospitalizations:  January 7-10, 201for shortness of breath, dyspnea, pleural effusion/ pulmonary edema/ volume overload January 15-28, 2052for chest pain/ discomfort, cough, tachycardia, AF with RVR; patient diagnosed with SIRS and pleural effusion requiring thoracentesis  Patient was discharged home to self-care on January 28, 2019with home health [facility]) services in place through [place] [facility]) for RN/ PT/OT.  Patient has history including, but not limited to CKD requiring hemodialysis M-W-F, CADwith valvular disease and previous CABG, HTN, A-Fib, CHF with cardiomyopathy, DM,liver cirrhosis,and GERD.  Call placed to patient's HCPOA (verified during [place] home visit on 12/22/17) to coordinate care/ discuss patient's care needs post-hospitalization.  Today, phone rang without pick up; eventually received outgoing voicemail stating, "the mailbox is full and cannot accept new mesages; please try your call later."  Unable to leave HIPAA compliant voicemail reqesting return call back.  Oneta Rack, RN, BSN, [employer] [facility] Care Management  [phone]

## 2017-12-28 NOTE — Patient Outreach (Signed)
[name]) Care Management [place] Telephone Outreach Care Coordination  12/28/2017  Michael [name] 07/10/1961 [id number]  Successful telephone outreach to Michael [name], [facility] RN Navigator, [phone] re: Michael [name], [name] y.o.malepreviously active with [facility] CM, re- referred to [place] fortransition of careafter 2 recent hospitalizations:  January 7-10, 2064for shortness of breath, dyspnea, pleural effusion/ pulmonary edema/ volume overload January 15-28, 2068for chest pain/ discomfort, cough, tachycardia, AF with RVR; patient diagnosed with SIRS and pleural effusion requiring thoracentesis  Patient was discharged home to self-care on January 28, 2019with home health [facility]) services in place through [facility]) for RN/ PT/OT.  Patient has history including, but not limited to CKD requiring hemodialysis M-W-F, CADwith valvular disease and previous CABG, HTN, A-Fib, CHF with cardiomyopathy, DM,liver cirrhosis,and GERD.  Received e-mail from [place] making me aware that she is also involved in patient's care and is working with patient weekly through his HD center, [employer] [facility].  Contacted Michael [name] by phone for care collaboration/ coordination; Michael [name] shared that she has been assisting patient with obtaining nutritional supplements (Nepro) and maintaining weekly outreaches to patient at HD center.  Michael [name] confirmed that patient is actively working with dietician at HD center, and we discussed various ongoing barriers around patient's care/ multiple recent hospitalizations, including cultural/ language barriers and lack of personal patient engagement in his care.  Confirmed with Michael [name] that [place] had discussed with patient that interpreter would be used with all outreach attempts to patient.  Michael [name] and I agreed to collaborate as indicated moving forward as outreach/ engagement attempts with patient  continue.  Plan:  Will collaborate as indicated with [place], RN, BSN, [place] Care Management  [phone]

## 2017-12-29 ENCOUNTER — Encounter: Payer: Self-pay | Admitting: *Deleted

## 2017-12-29 ENCOUNTER — Other Ambulatory Visit: Payer: Self-pay | Admitting: *Deleted

## 2017-12-29 DIAGNOSIS — I132 Hypertensive heart and chronic kidney disease with heart failure and with stage 5 chronic kidney disease, or end stage renal disease: Secondary | ICD-10-CM | POA: Diagnosis not present

## 2017-12-29 DIAGNOSIS — I5022 Chronic systolic (congestive) heart failure: Secondary | ICD-10-CM | POA: Diagnosis not present

## 2017-12-29 DIAGNOSIS — N186 End stage renal disease: Secondary | ICD-10-CM | POA: Diagnosis not present

## 2017-12-29 DIAGNOSIS — I2510 Atherosclerotic heart disease of native coronary artery without angina pectoris: Secondary | ICD-10-CM | POA: Diagnosis not present

## 2017-12-29 DIAGNOSIS — E1122 Type 2 diabetes mellitus with diabetic chronic kidney disease: Secondary | ICD-10-CM | POA: Diagnosis not present

## 2017-12-29 DIAGNOSIS — J90 Pleural effusion, not elsewhere classified: Secondary | ICD-10-CM | POA: Diagnosis not present

## 2017-12-29 NOTE — Patient Outreach (Signed)
Michael [name]) Care Management [place] Telephone Outreach, Transition of Care day 14  12/29/2017  Michael [name] [birth year] [id number]  Successful telephone outreach to [facility], 57 y.o. male previously active with [place], 57 y.o. male previously active with [place] re- referred to [place] fortransition of careafter 2 recent hospitalizations:  January 7-10, 2036for shortness of breath, dyspnea, pleural effusion/ pulmonary edema/ volume overload January 15-28, 2014for chest pain/ discomfort, cough, tachycardia, AF with RVR; patient diagnosed with SIRS and pleural effusion requiring thoracentesis  Patient was discharged home to self-care on December 14, 2017 with home health [facility]) services in place through [facility]) for RN/ PT/OT.  Patient has history including, but not limited to CKD requiring hemodialysis M-W-F, CADwith valvular disease and previous CABG, HTN, A-Fib, CHF with cardiomyopathy, DM, liver cirrhosis, and GERD.  09:30 am: Placed call to [facility] interpreter services ([phone])/[place] interpreter "Siree" [phone]).  Interpreter participated in entire 30 minute phone call today;  HIPAA/ identity verified with patient during phone call today.  Today, through interpreter, patient again reports that he is "doing fine," and he denies pain, concerns/ problems. Patient sounds to be in no obvious/ apparent distress throughout entirety of phone call today.   Patient further reports:  Medications: -- Has all medicationsand is taking as prescribed;reports room mate Loree [name] continues preparing his medications using pill planner box, according to instruction provided by [facility] Pharmacist [name] on 12/22/17 during [facility] initial home visit.  At that time, several discrepancies in medication dosing were noted from last hospital discharge instructions: patient reports that he is taking medications as directed by PCP during office visit 12/25/17. --  denies concerns/ questions and issues around medications today, and remains unable to verbalizes a general understanding of the purpose, dosing, and scheduling of medications; continues to report that his room mate Lipan manages his medications and attended recent PCP office visit with him on 12/25/17  Home health [facility]) services: -- [place] services for PT, OT, RN continue; patient remains unable to tell me which agency/ disciplines are involved -- confirms that [facility] services "visited last week;" reports he thinks they will be visiting him "later today"  Provider appointments: -- All recent/ upcoming provider appointments were reviewed with patient today; patient reports that he is aware and has/ or "will" arrange transportation to all upcoming scheduled appointments -- Patient reports he has attended all scheduled provider appointments; positive reinforcement provided -- patient scheduled for coumadin clinic appointment on 01/05/18   Self-health management of multiple chronic disease states, including CKD, CHF, CAD, AF, and DM: -- Patient previously denied following any specific diet; today, he reports that he "eats low sugar and low salt" diet -- reports has attended all recent scheduled HD sessions  -- previously reported that he does not perform weights at home, however in discussing fluid balance in setting of CKD requiring HD, patient reports that he DOES monitor his daily weights, "twice a day."  Reports weights have all been "59-62 kg," with weight this morning of "61 kg."  Discussed importance of monitoring daily weights, and provided positive reinforcement for patient report today that he does monitor his daily weights; encouraged patient to continue to do and to begin recording weights.  Discussed importance of regulating fluid intake; patient reports he does not know how much fluid he takes every day, but states that his daughter provided a "container" for him to go by in managing daily fluid  intake. -- continues to report that he does not have to use home  O2 "very often,. Reports has not needed recently, except when he attends HD; reports O2 used during HD sessions, but "not at home."  Denies issues around breathing/ shortness of breath/ congestion -- again encouraged patient to be thinking of his personal goals around his health care/ self-health management of multiple chronic conditions, and provided positive reinforcement to patient for his reports today that he is monitoring his daily weights and watching his fluid intake; provided basic education around importance of fluid intake management and daily weights in setting of CKD requiring HD. -- discussed patient's understanding of A-Fib; patient reports he is able to tell when his heart is out of rhythm, and he denies shortness of breath, palpitations, dizziness today.  Patient denies further issues, concerns, or problems today.Reiterated with patient importance of maintaining contact with [facility] RN CCM.  I provided/ confirmed that patient hasmy direct phone number, the main [facility] CM office phone number, and the [facility] CM 24-hour nurse advice phone number should issues arise prior to next scheduled [place] outreach by phone next week.  Plan:  Patient will take medications as prescribed and will attend all scheduled provider appointments  Patient will promptly contact care providers for any concerns/ issues/ problems that arise  Patient will continue monitoring and will begin recording daily weights  Patient will actively participate in home health services as ordered post-hospital discharge  Patient will maintain contact with [place] outreach for transition of care to continue with scheduled telephone call next week   [facility] CM Care Plan Problem One     Most Recent Value  Care Plan Problem One  Risk for hospital readmission related to/ evidenced by 2 recent extended hospitalizations for pleural  effusion, A-fib, fluid volume overload  Role Documenting the Problem One  Care Management Coordinator  Care Plan for Problem One  Active  THN Long Term Goal   Over the next 31 days, patient will not experience hospital re-admission, as evidenced by patient reporting and review of EMR during [place] outreach  [place] Term Goal Start Date  12/16/17  Interventions for Problem One Long Term Goal  Using interpreter phone line, completed [place] call,  discussed with patient current clinical status, recent provider appointments, medication management,  discussed home health services in place and confirmed that patient is actively participating in [facility] services as ordered post-hospital discharge,  again discussed [place] CM role, and how [place] services differ from [facility] services,  education around reasons to contact EMS provided.  Reinforced with patient to promptly notify care providers for any new concerns, issues, or problems that arise, and confirmed that patient is attending HD sessions and has transportation arranged  TH[facility] CM Short Term Goal #1   Over the next 30 days, patient will actively participate in home health services as prdered post-hospital discharge, as evidenced by patient/ caregiver reporting and collaboration as indicated with [facility] staff during [place] outreach  [facility] CM Short Term Goal #1 Start Date  12/16/17  Interventions for Short Term Goal #1  Confirmed with patient that home health services continue, and encouraged patient's full participation in [facility] services,  confirmed that patient expects visit from [facility] staff today   [place] CM Short Term Goal #2   Over the next 30 days, patient will attend all scheduled provider appointments, as evidenced by patient reporting and review of EMR during [place] outreach  [facility] CM Short Term Goal #2  Start Date  12/22/17  Interventions for Short Term Goal #2  Discussed with patient and his roommate all upcoming provider appointments and  encouraged patient to attend all appointments as scheduled,  encouraged patient and his room mate to contact providers promptly for any new concerns, issues, problems that arise between provider appointments    [facility] CM Care Plan Problem Two     Most Recent Value  Care Plan Problem Two  Lack of patient engagement in his personal health care conditions and needs, as evidenced by patient reporting and behavior, indicating same  Role Documenting the Problem Two  Care Management [place] for Problem Two  Active  Interventions for Problem Two Long Term Goal   Again discussed with patient his current understanding of his multiple chronic health care conditions, and initiated conversation with patient around his current management of chronic health conditions  THN Long Term Goal  Over the next 60 days, patient will actively participate in [place] program and will maintain contact with [place], as evidenced by active ongoing patient participation in [place] program  [place] Term Goal Start Date  12/22/17  [facility] CM Short Term Goal #1   Over the next 30 days, patient will continue to monitor and record his daily weights and will contact his care providers for weight gain of > 3 lbs overnight, 5 lbs in one week, as evidenced by review of patient's recorded daily weights during [place] CCM outreach  [facility] CM Short Term Goal #1 Start Date  12/29/17  Interventions for Short Term Goal #2   Discussed with patient his current diet, fluid restrictions, and adherence to monitoring and recording daily weights,  encouraged patient to record daily weights consistently     Oneta Rack, RN, BSN, [employer] Coordinator [facility] Care Management  [phone]

## 2017-12-30 ENCOUNTER — Ambulatory Visit (INDEPENDENT_AMBULATORY_CARE_PROVIDER_SITE_OTHER): Payer: Medicare Other | Admitting: Cardiovascular Disease

## 2017-12-30 DIAGNOSIS — Z5181 Encounter for therapeutic drug level monitoring: Secondary | ICD-10-CM | POA: Diagnosis not present

## 2017-12-30 DIAGNOSIS — D509 Iron deficiency anemia, unspecified: Secondary | ICD-10-CM | POA: Diagnosis not present

## 2017-12-30 DIAGNOSIS — I059 Rheumatic mitral valve disease, unspecified: Secondary | ICD-10-CM

## 2017-12-30 DIAGNOSIS — Z992 Dependence on renal dialysis: Secondary | ICD-10-CM | POA: Diagnosis not present

## 2017-12-30 DIAGNOSIS — E876 Hypokalemia: Secondary | ICD-10-CM | POA: Diagnosis not present

## 2017-12-30 DIAGNOSIS — N186 End stage renal disease: Secondary | ICD-10-CM | POA: Diagnosis not present

## 2017-12-30 DIAGNOSIS — I079 Rheumatic tricuspid valve disease, unspecified: Secondary | ICD-10-CM

## 2017-12-30 DIAGNOSIS — E119 Type 2 diabetes mellitus without complications: Secondary | ICD-10-CM | POA: Diagnosis not present

## 2017-12-30 DIAGNOSIS — D631 Anemia in chronic kidney disease: Secondary | ICD-10-CM | POA: Diagnosis not present

## 2017-12-31 ENCOUNTER — Other Ambulatory Visit: Payer: Self-pay

## 2017-12-31 NOTE — Patient Outreach (Signed)
[place] [facility]) Care Management  12/31/2017  Michael [name] Jun 09, 1961 [id number]   Multidisciplinary discussion completed. Patient is using the interpreter regularly. [facility] CM is providing disease education. Continuing with Case Manager to build trust to encourage compliance with health behaviors.  Plan: assigned care coordinator will continue to follow   Covering Care Coordinator: Thea Silversmith, RN, MSN, [place] Coordinator Cell: [phone]

## 2017-12-31 NOTE — Patient Instructions (Signed)
Description   12/30/2017 Left message on machine to take extra 1/2 tablet today Feb 13th and will call in am for further instructions, 12/31/17 spoke with pt & he took 2 tablets last night & today instructed pt to take 1.5 tablets today then continue taking 1 tablet (2.5mg ) daily except 1.5 tablets (3.75mg ) on Wednesdays and Sundays. Recheck INR on Tuesday at 245pm in the office.

## 2018-01-01 DIAGNOSIS — D509 Iron deficiency anemia, unspecified: Secondary | ICD-10-CM | POA: Diagnosis not present

## 2018-01-01 DIAGNOSIS — Z992 Dependence on renal dialysis: Secondary | ICD-10-CM | POA: Diagnosis not present

## 2018-01-01 DIAGNOSIS — E876 Hypokalemia: Secondary | ICD-10-CM | POA: Diagnosis not present

## 2018-01-01 DIAGNOSIS — D631 Anemia in chronic kidney disease: Secondary | ICD-10-CM | POA: Diagnosis not present

## 2018-01-01 DIAGNOSIS — N186 End stage renal disease: Secondary | ICD-10-CM | POA: Diagnosis not present

## 2018-01-01 DIAGNOSIS — E119 Type 2 diabetes mellitus without complications: Secondary | ICD-10-CM | POA: Diagnosis not present

## 2018-01-02 DIAGNOSIS — Z992 Dependence on renal dialysis: Secondary | ICD-10-CM | POA: Diagnosis not present

## 2018-01-02 DIAGNOSIS — N186 End stage renal disease: Secondary | ICD-10-CM | POA: Diagnosis not present

## 2018-01-02 DIAGNOSIS — E119 Type 2 diabetes mellitus without complications: Secondary | ICD-10-CM | POA: Diagnosis not present

## 2018-01-02 DIAGNOSIS — D631 Anemia in chronic kidney disease: Secondary | ICD-10-CM | POA: Diagnosis not present

## 2018-01-02 DIAGNOSIS — D509 Iron deficiency anemia, unspecified: Secondary | ICD-10-CM | POA: Diagnosis not present

## 2018-01-02 DIAGNOSIS — E876 Hypokalemia: Secondary | ICD-10-CM | POA: Diagnosis not present

## 2018-01-04 DIAGNOSIS — D509 Iron deficiency anemia, unspecified: Secondary | ICD-10-CM | POA: Diagnosis not present

## 2018-01-04 DIAGNOSIS — D631 Anemia in chronic kidney disease: Secondary | ICD-10-CM | POA: Diagnosis not present

## 2018-01-04 DIAGNOSIS — E876 Hypokalemia: Secondary | ICD-10-CM | POA: Diagnosis not present

## 2018-01-04 DIAGNOSIS — E119 Type 2 diabetes mellitus without complications: Secondary | ICD-10-CM | POA: Diagnosis not present

## 2018-01-04 DIAGNOSIS — N186 End stage renal disease: Secondary | ICD-10-CM | POA: Diagnosis not present

## 2018-01-04 DIAGNOSIS — I482 Chronic atrial fibrillation: Secondary | ICD-10-CM | POA: Diagnosis not present

## 2018-01-04 DIAGNOSIS — Z992 Dependence on renal dialysis: Secondary | ICD-10-CM | POA: Diagnosis not present

## 2018-01-04 DIAGNOSIS — I871 Compression of vein: Secondary | ICD-10-CM | POA: Diagnosis not present

## 2018-01-05 ENCOUNTER — Encounter: Payer: Self-pay | Admitting: *Deleted

## 2018-01-05 ENCOUNTER — Other Ambulatory Visit: Payer: Self-pay | Admitting: *Deleted

## 2018-01-05 ENCOUNTER — Telehealth: Payer: Self-pay | Admitting: Pharmacist

## 2018-01-05 LAB — PROTIME-INR: INR: 1.3 — AB (ref 0.9–1.1)

## 2018-01-05 NOTE — Telephone Encounter (Signed)
Spoke with patient son to reschedule missed INR appt from today. Advised to take extra 1/2 tablet tonight since INR low at dialysis yesterday (1.26). Appt made for Thursday.

## 2018-01-05 NOTE — Patient Outreach (Addendum)
[facility]) Care Management [place] Telephone Outreach, Transition of Care day 21  01/05/2018  Michael [name] 1961/02/13 [id number]  Unsuccessful telephone outreach to [facility], 57 y.o.malepreviously active with [facility] CM, re- referred to [place] fortransition of careafter 2 recent hospitalizations:  January 7-10, 2039for shortness of breath, dyspnea, pleural effusion/ pulmonary edema/ volume overload January 15-28, 2049for chest pain/ discomfort, cough, tachycardia, AF with RVR; patient diagnosed with SIRS and pleural effusion requiring thoracentesis  Patient was discharged home to self-care on January 28, 2019with home health [facility]) services in place through [facility]) for RN/ PT/OT.  Patient has history including, but not limited to CKD requiring hemodialysis M-W-F, CADwith valvular disease and previous CABG, HTN, A-Fib, CHF with cardiomyopathy, DM,liver cirrhosis,and GERD.  09:35 PR:FFMBWG call to [facility] interpreter services ([phone])/Laotian interpreter "Molly"(ID--[id number]).   Interpreter left patient HIPAA compliant voice mail message requesting return call back.  Plan:  Will re-attempt [place] telephone outreach again tomorrow if I do not hear back from patient later today.  Oneta Rack, RN, BSN, [employer] [facility] Care Management  [phone]

## 2018-01-06 ENCOUNTER — Encounter: Payer: Self-pay | Admitting: *Deleted

## 2018-01-06 ENCOUNTER — Other Ambulatory Visit: Payer: Self-pay | Admitting: *Deleted

## 2018-01-06 DIAGNOSIS — D509 Iron deficiency anemia, unspecified: Secondary | ICD-10-CM | POA: Diagnosis not present

## 2018-01-06 DIAGNOSIS — E876 Hypokalemia: Secondary | ICD-10-CM | POA: Diagnosis not present

## 2018-01-06 DIAGNOSIS — Z992 Dependence on renal dialysis: Secondary | ICD-10-CM | POA: Diagnosis not present

## 2018-01-06 DIAGNOSIS — E119 Type 2 diabetes mellitus without complications: Secondary | ICD-10-CM | POA: Diagnosis not present

## 2018-01-06 DIAGNOSIS — N186 End stage renal disease: Secondary | ICD-10-CM | POA: Diagnosis not present

## 2018-01-06 DIAGNOSIS — D631 Anemia in chronic kidney disease: Secondary | ICD-10-CM | POA: Diagnosis not present

## 2018-01-06 NOTE — Patient Outreach (Signed)
[place] [facility]) Care Management [place], Transition of Care day 22 Unsuccessful call attempt #2 01/06/2018  Michael [name] 13-Nov-1961 [id number]  09:20 am: Unsuccessful telephone outreach toPhouvong [name],57 y.o.malepreviously active with [place] CM, re- referred to [place] fortransition of careafter 2 recent hospitalizations:  January 7-10, 2049for shortness of breath, dyspnea, pleural effusion/ pulmonary edema/ volume overload January 15-28, 2036for chest pain/ discomfort, cough, tachycardia, AF with RVR; patient diagnosed with SIRS and pleural effusion requiring thoracentesis  Patient was discharged home to self-care on January 28, 2019with home health [facility]) services in place through [facility] [facility]) for RN/ PT/OT.  Patient has history including, but not limited to CKD requiring hemodialysis M-W-F, CADwith valvular disease and previous CABG, HTN, A-Fib, CHF with cardiomyopathy, DM,liver cirrhosis,and GERD.  09:15am:Placed call to [facility] interpreter services ([phone])/Laotian interpreter "Siree"(ID--[id number]).   Interpreter left patient HIPAA compliant voice mail message requesting return call back.  Plan:  Will place [facility] case closure letter in mail to patient, requesting call back, and will schedule final telephone outreach attempt in 9 business days; if no response back from patient prior to next scheduled call, will close [place] case if unsuccessful final call attempt  Oneta Rack, RN, BSN, [place] Coordinator [facility] Care Management  [phone]

## 2018-01-07 ENCOUNTER — Ambulatory Visit (INDEPENDENT_AMBULATORY_CARE_PROVIDER_SITE_OTHER): Payer: Medicare Other | Admitting: *Deleted

## 2018-01-07 DIAGNOSIS — I059 Rheumatic mitral valve disease, unspecified: Secondary | ICD-10-CM | POA: Diagnosis not present

## 2018-01-07 DIAGNOSIS — Z5181 Encounter for therapeutic drug level monitoring: Secondary | ICD-10-CM

## 2018-01-07 DIAGNOSIS — I079 Rheumatic tricuspid valve disease, unspecified: Secondary | ICD-10-CM | POA: Diagnosis not present

## 2018-01-07 LAB — POCT INR: INR: 1.2

## 2018-01-07 NOTE — Patient Instructions (Signed)
Description   Today take 2 tablets,tomorrow take 1.5 tablets, then continue taking 1 tablet (2.5mg ) daily except 1.5 tablets (3.75mg ) on Wednesdays and Sundays. Recheck INR in one week.

## 2018-01-08 DIAGNOSIS — D631 Anemia in chronic kidney disease: Secondary | ICD-10-CM | POA: Diagnosis not present

## 2018-01-08 DIAGNOSIS — E876 Hypokalemia: Secondary | ICD-10-CM | POA: Diagnosis not present

## 2018-01-08 DIAGNOSIS — N186 End stage renal disease: Secondary | ICD-10-CM | POA: Diagnosis not present

## 2018-01-08 DIAGNOSIS — E119 Type 2 diabetes mellitus without complications: Secondary | ICD-10-CM | POA: Diagnosis not present

## 2018-01-08 DIAGNOSIS — D509 Iron deficiency anemia, unspecified: Secondary | ICD-10-CM | POA: Diagnosis not present

## 2018-01-08 DIAGNOSIS — Z992 Dependence on renal dialysis: Secondary | ICD-10-CM | POA: Diagnosis not present

## 2018-01-09 DIAGNOSIS — E119 Type 2 diabetes mellitus without complications: Secondary | ICD-10-CM | POA: Diagnosis not present

## 2018-01-09 DIAGNOSIS — D509 Iron deficiency anemia, unspecified: Secondary | ICD-10-CM | POA: Diagnosis not present

## 2018-01-09 DIAGNOSIS — N186 End stage renal disease: Secondary | ICD-10-CM | POA: Diagnosis not present

## 2018-01-09 DIAGNOSIS — E876 Hypokalemia: Secondary | ICD-10-CM | POA: Diagnosis not present

## 2018-01-09 DIAGNOSIS — Z992 Dependence on renal dialysis: Secondary | ICD-10-CM | POA: Diagnosis not present

## 2018-01-09 DIAGNOSIS — D631 Anemia in chronic kidney disease: Secondary | ICD-10-CM | POA: Diagnosis not present

## 2018-01-11 DIAGNOSIS — D509 Iron deficiency anemia, unspecified: Secondary | ICD-10-CM | POA: Diagnosis not present

## 2018-01-11 DIAGNOSIS — E876 Hypokalemia: Secondary | ICD-10-CM | POA: Diagnosis not present

## 2018-01-11 DIAGNOSIS — D631 Anemia in chronic kidney disease: Secondary | ICD-10-CM | POA: Diagnosis not present

## 2018-01-11 DIAGNOSIS — N186 End stage renal disease: Secondary | ICD-10-CM | POA: Diagnosis not present

## 2018-01-11 DIAGNOSIS — Z992 Dependence on renal dialysis: Secondary | ICD-10-CM | POA: Diagnosis not present

## 2018-01-11 DIAGNOSIS — E119 Type 2 diabetes mellitus without complications: Secondary | ICD-10-CM | POA: Diagnosis not present

## 2018-01-11 DIAGNOSIS — I482 Chronic atrial fibrillation: Secondary | ICD-10-CM | POA: Diagnosis not present

## 2018-01-11 LAB — PROTIME-INR: INR: 2 — AB (ref 0.9–1.1)

## 2018-01-12 ENCOUNTER — Other Ambulatory Visit: Payer: Self-pay | Admitting: Pharmacist

## 2018-01-12 NOTE — Patient Outreach (Signed)
[place] [facility]) Care Management  01/12/2018  Michael [name] 02/29/1961 [id number]  Call placed to [facility], [name] interpreter, [place], [place] was utilized during call with patient.   Call placed to patient, HIPAA details verified.   Through use of interpreter, patient reports everything is fine.  Patient reports his friend is filling his pill box.  Patient denies missed medication doses in the past week.  He denies questions about his medications when asked.    Offered a follow-up [facility] Pharmacist home visit, and patient denied wanting to schedule a home visit---he reports everything is fine.   Plan:  Will place a follow-up call to patient in the next 2 weeks.   If he continues to decline home visit or medication related needs, will close his case.  He does not have a goal he would like to set with [facility] Pharmacist for his care.    Updated [facility] RN Richarda Osmond via phone.   [name], PharmD, [place] [facility] [phone]

## 2018-01-13 DIAGNOSIS — D631 Anemia in chronic kidney disease: Secondary | ICD-10-CM | POA: Diagnosis not present

## 2018-01-13 DIAGNOSIS — E876 Hypokalemia: Secondary | ICD-10-CM | POA: Diagnosis not present

## 2018-01-13 DIAGNOSIS — D509 Iron deficiency anemia, unspecified: Secondary | ICD-10-CM | POA: Diagnosis not present

## 2018-01-13 DIAGNOSIS — N186 End stage renal disease: Secondary | ICD-10-CM | POA: Diagnosis not present

## 2018-01-13 DIAGNOSIS — E119 Type 2 diabetes mellitus without complications: Secondary | ICD-10-CM | POA: Diagnosis not present

## 2018-01-13 DIAGNOSIS — Z992 Dependence on renal dialysis: Secondary | ICD-10-CM | POA: Diagnosis not present

## 2018-01-14 ENCOUNTER — Ambulatory Visit (INDEPENDENT_AMBULATORY_CARE_PROVIDER_SITE_OTHER): Payer: Medicare Other | Admitting: Internal Medicine

## 2018-01-14 DIAGNOSIS — I079 Rheumatic tricuspid valve disease, unspecified: Secondary | ICD-10-CM | POA: Diagnosis not present

## 2018-01-14 DIAGNOSIS — Z5181 Encounter for therapeutic drug level monitoring: Secondary | ICD-10-CM

## 2018-01-14 DIAGNOSIS — I059 Rheumatic mitral valve disease, unspecified: Secondary | ICD-10-CM

## 2018-01-15 ENCOUNTER — Ambulatory Visit: Payer: Self-pay | Admitting: *Deleted

## 2018-01-15 ENCOUNTER — Ambulatory Visit: Payer: Medicare Other | Admitting: Family Medicine

## 2018-01-15 DIAGNOSIS — N186 End stage renal disease: Secondary | ICD-10-CM | POA: Diagnosis not present

## 2018-01-15 DIAGNOSIS — Z992 Dependence on renal dialysis: Secondary | ICD-10-CM | POA: Diagnosis not present

## 2018-01-15 DIAGNOSIS — I509 Heart failure, unspecified: Secondary | ICD-10-CM | POA: Diagnosis not present

## 2018-01-16 DIAGNOSIS — Z23 Encounter for immunization: Secondary | ICD-10-CM | POA: Diagnosis not present

## 2018-01-16 DIAGNOSIS — E876 Hypokalemia: Secondary | ICD-10-CM | POA: Diagnosis not present

## 2018-01-16 DIAGNOSIS — N2581 Secondary hyperparathyroidism of renal origin: Secondary | ICD-10-CM | POA: Diagnosis not present

## 2018-01-16 DIAGNOSIS — I50810 Right heart failure, unspecified: Secondary | ICD-10-CM | POA: Diagnosis not present

## 2018-01-16 DIAGNOSIS — D631 Anemia in chronic kidney disease: Secondary | ICD-10-CM | POA: Diagnosis not present

## 2018-01-16 DIAGNOSIS — I509 Heart failure, unspecified: Secondary | ICD-10-CM | POA: Diagnosis not present

## 2018-01-16 DIAGNOSIS — N186 End stage renal disease: Secondary | ICD-10-CM | POA: Diagnosis not present

## 2018-01-16 DIAGNOSIS — E119 Type 2 diabetes mellitus without complications: Secondary | ICD-10-CM | POA: Diagnosis not present

## 2018-01-16 DIAGNOSIS — I1310 Hypertensive heart and chronic kidney disease without heart failure, with stage 1 through stage 4 chronic kidney disease, or unspecified chronic kidney disease: Secondary | ICD-10-CM | POA: Diagnosis not present

## 2018-01-16 DIAGNOSIS — I132 Hypertensive heart and chronic kidney disease with heart failure and with stage 5 chronic kidney disease, or end stage renal disease: Secondary | ICD-10-CM | POA: Diagnosis not present

## 2018-01-16 DIAGNOSIS — Z992 Dependence on renal dialysis: Secondary | ICD-10-CM | POA: Diagnosis not present

## 2018-01-16 DIAGNOSIS — D509 Iron deficiency anemia, unspecified: Secondary | ICD-10-CM | POA: Diagnosis not present

## 2018-01-16 DIAGNOSIS — I5022 Chronic systolic (congestive) heart failure: Secondary | ICD-10-CM | POA: Diagnosis not present

## 2018-01-18 ENCOUNTER — Encounter: Payer: Self-pay | Admitting: *Deleted

## 2018-01-18 ENCOUNTER — Other Ambulatory Visit: Payer: Self-pay | Admitting: *Deleted

## 2018-01-18 DIAGNOSIS — D509 Iron deficiency anemia, unspecified: Secondary | ICD-10-CM | POA: Diagnosis not present

## 2018-01-18 DIAGNOSIS — D631 Anemia in chronic kidney disease: Secondary | ICD-10-CM | POA: Diagnosis not present

## 2018-01-18 DIAGNOSIS — N2581 Secondary hyperparathyroidism of renal origin: Secondary | ICD-10-CM | POA: Diagnosis not present

## 2018-01-18 DIAGNOSIS — Z992 Dependence on renal dialysis: Secondary | ICD-10-CM | POA: Diagnosis not present

## 2018-01-18 DIAGNOSIS — E119 Type 2 diabetes mellitus without complications: Secondary | ICD-10-CM | POA: Diagnosis not present

## 2018-01-18 DIAGNOSIS — I482 Chronic atrial fibrillation: Secondary | ICD-10-CM | POA: Diagnosis not present

## 2018-01-18 DIAGNOSIS — N186 End stage renal disease: Secondary | ICD-10-CM | POA: Diagnosis not present

## 2018-01-18 NOTE — Patient Outreach (Signed)
[place] [facility]) Care Management [place] Telephone Outreach Unsuccessful attempt #3  01/18/2018  [name] 19-Mar-1961 [id number]  10:05 am: Unsuccessful telephone outreach toPhouvong [name],57 y.o.malepreviously active with [place] CM, re- referred to [place] fortransition of careafter 2 recent hospitalizations:  January 7-10, 2044for shortness of breath, dyspnea, pleural effusion/ pulmonary edema/ volume overload January 15-28, 2082for chest pain/ discomfort, cough, tachycardia, AF with RVR; patient diagnosed with SIRS and pleural effusion requiring thoracentesis  Patient was discharged home to self-care on January 28, 2019with home health [facility]) services in place through [facility]) for RN/ PT/OT.  Patient has history including, but not limited to CKD requiring hemodialysis M-W-F, CADwith valvular disease and previous CABG, HTN, A-Fib, CHF with cardiomyopathy, DM,liver cirrhosis,and GERD.  10:00am:Placed call to [facility] interpreter services ([phone])/Laotian interpreter "Shawn"(ID--[id number]).   Interpreterleft patientHIPAA compliant voice mail message for patient requesting return call back.  Plan:  Will close [place] program, as patient has not returned call to [place] since case closure letter was sent 10 business days ago, and will make patient's PCP and [facility] CMA aware of same  Oneta Rack, RN, BSN, [place] Coordinator [facility] Care Management  [phone]

## 2018-01-19 NOTE — Progress Notes (Signed)
This encounter was created in error - please disregard.

## 2018-01-20 DIAGNOSIS — D509 Iron deficiency anemia, unspecified: Secondary | ICD-10-CM | POA: Diagnosis not present

## 2018-01-20 DIAGNOSIS — D631 Anemia in chronic kidney disease: Secondary | ICD-10-CM | POA: Diagnosis not present

## 2018-01-20 DIAGNOSIS — E119 Type 2 diabetes mellitus without complications: Secondary | ICD-10-CM | POA: Diagnosis not present

## 2018-01-20 DIAGNOSIS — Z992 Dependence on renal dialysis: Secondary | ICD-10-CM | POA: Diagnosis not present

## 2018-01-20 DIAGNOSIS — N186 End stage renal disease: Secondary | ICD-10-CM | POA: Diagnosis not present

## 2018-01-20 DIAGNOSIS — N2581 Secondary hyperparathyroidism of renal origin: Secondary | ICD-10-CM | POA: Diagnosis not present

## 2018-01-21 ENCOUNTER — Ambulatory Visit (INDEPENDENT_AMBULATORY_CARE_PROVIDER_SITE_OTHER): Payer: Medicare Other | Admitting: *Deleted

## 2018-01-21 DIAGNOSIS — I059 Rheumatic mitral valve disease, unspecified: Secondary | ICD-10-CM

## 2018-01-21 DIAGNOSIS — Z5181 Encounter for therapeutic drug level monitoring: Secondary | ICD-10-CM | POA: Diagnosis not present

## 2018-01-21 DIAGNOSIS — I079 Rheumatic tricuspid valve disease, unspecified: Secondary | ICD-10-CM

## 2018-01-21 LAB — POCT INR: INR: 1.3

## 2018-01-21 NOTE — Patient Instructions (Signed)
Description   Today and tomorrow take 1.5 tablets then start taking 1 tablet daily except 1.5 tablet on Monday, Wednesday, and Saturday . Recheck INR in one week.

## 2018-01-22 DIAGNOSIS — D509 Iron deficiency anemia, unspecified: Secondary | ICD-10-CM | POA: Diagnosis not present

## 2018-01-22 DIAGNOSIS — E119 Type 2 diabetes mellitus without complications: Secondary | ICD-10-CM | POA: Diagnosis not present

## 2018-01-22 DIAGNOSIS — D631 Anemia in chronic kidney disease: Secondary | ICD-10-CM | POA: Diagnosis not present

## 2018-01-22 DIAGNOSIS — N186 End stage renal disease: Secondary | ICD-10-CM | POA: Diagnosis not present

## 2018-01-22 DIAGNOSIS — Z992 Dependence on renal dialysis: Secondary | ICD-10-CM | POA: Diagnosis not present

## 2018-01-22 DIAGNOSIS — N2581 Secondary hyperparathyroidism of renal origin: Secondary | ICD-10-CM | POA: Diagnosis not present

## 2018-01-23 DIAGNOSIS — N2581 Secondary hyperparathyroidism of renal origin: Secondary | ICD-10-CM | POA: Diagnosis not present

## 2018-01-23 DIAGNOSIS — N186 End stage renal disease: Secondary | ICD-10-CM | POA: Diagnosis not present

## 2018-01-23 DIAGNOSIS — E119 Type 2 diabetes mellitus without complications: Secondary | ICD-10-CM | POA: Diagnosis not present

## 2018-01-23 DIAGNOSIS — D509 Iron deficiency anemia, unspecified: Secondary | ICD-10-CM | POA: Diagnosis not present

## 2018-01-23 DIAGNOSIS — D631 Anemia in chronic kidney disease: Secondary | ICD-10-CM | POA: Diagnosis not present

## 2018-01-23 DIAGNOSIS — Z992 Dependence on renal dialysis: Secondary | ICD-10-CM | POA: Diagnosis not present

## 2018-01-25 DIAGNOSIS — E119 Type 2 diabetes mellitus without complications: Secondary | ICD-10-CM | POA: Diagnosis not present

## 2018-01-25 DIAGNOSIS — N2581 Secondary hyperparathyroidism of renal origin: Secondary | ICD-10-CM | POA: Diagnosis not present

## 2018-01-25 DIAGNOSIS — D509 Iron deficiency anemia, unspecified: Secondary | ICD-10-CM | POA: Diagnosis not present

## 2018-01-25 DIAGNOSIS — D631 Anemia in chronic kidney disease: Secondary | ICD-10-CM | POA: Diagnosis not present

## 2018-01-25 DIAGNOSIS — Z992 Dependence on renal dialysis: Secondary | ICD-10-CM | POA: Diagnosis not present

## 2018-01-25 DIAGNOSIS — I482 Chronic atrial fibrillation: Secondary | ICD-10-CM | POA: Diagnosis not present

## 2018-01-25 DIAGNOSIS — N186 End stage renal disease: Secondary | ICD-10-CM | POA: Diagnosis not present

## 2018-01-26 ENCOUNTER — Other Ambulatory Visit: Payer: Self-pay | Admitting: Pharmacist

## 2018-01-26 LAB — ACID FAST CULTURE WITH REFLEXED SENSITIVITIES: ACID FAST CULTURE - AFSCU3: NEGATIVE

## 2018-01-26 LAB — ACID FAST CULTURE WITH REFLEXED SENSITIVITIES (MYCOBACTERIA)

## 2018-01-26 NOTE — Patient Outreach (Signed)
[place] [facility]) Care Management  01/26/2018  Arlene Hietala 03-01-1961 [id number]   Unsuccessful phone outreach to patient.  [place], [place] # [phone], was available as a conference call for interpretation resources.    Plan:  Will make second phone outreach to patient in the next week.   [name], PharmD, [place] [phone]

## 2018-01-27 ENCOUNTER — Other Ambulatory Visit: Payer: Self-pay | Admitting: Pharmacist

## 2018-01-27 DIAGNOSIS — N186 End stage renal disease: Secondary | ICD-10-CM | POA: Diagnosis not present

## 2018-01-27 DIAGNOSIS — D631 Anemia in chronic kidney disease: Secondary | ICD-10-CM | POA: Diagnosis not present

## 2018-01-27 DIAGNOSIS — D509 Iron deficiency anemia, unspecified: Secondary | ICD-10-CM | POA: Diagnosis not present

## 2018-01-27 DIAGNOSIS — Z992 Dependence on renal dialysis: Secondary | ICD-10-CM | POA: Diagnosis not present

## 2018-01-27 DIAGNOSIS — N2581 Secondary hyperparathyroidism of renal origin: Secondary | ICD-10-CM | POA: Diagnosis not present

## 2018-01-27 DIAGNOSIS — E119 Type 2 diabetes mellitus without complications: Secondary | ICD-10-CM | POA: Diagnosis not present

## 2018-01-27 NOTE — Patient Outreach (Signed)
[place] [facility]) Care Management  01/27/2018  Doyne Woollard 1961-03-16 [id number]  Second unsuccessful phone outreach to patient, no answer.   [place], interpreter ID [phone], was available on the phone to provide translation services if patient answered call.   Plan:  Will send outreach letter and place third and final outreach attempt in 10 business days to patient.   [name], PharmD, [name] [phone]

## 2018-01-28 ENCOUNTER — Ambulatory Visit (INDEPENDENT_AMBULATORY_CARE_PROVIDER_SITE_OTHER): Payer: Medicare Other | Admitting: *Deleted

## 2018-01-28 DIAGNOSIS — I059 Rheumatic mitral valve disease, unspecified: Secondary | ICD-10-CM

## 2018-01-28 DIAGNOSIS — Z5181 Encounter for therapeutic drug level monitoring: Secondary | ICD-10-CM

## 2018-01-28 DIAGNOSIS — I079 Rheumatic tricuspid valve disease, unspecified: Secondary | ICD-10-CM | POA: Diagnosis not present

## 2018-01-28 LAB — POCT INR: INR: 1.2

## 2018-01-28 NOTE — Patient Instructions (Signed)
Description   Today take 1.5 tablets, then start taking 1.5 tablets everyday except 1 tablet on Sundays and Thursdays . Recheck INR in one week.

## 2018-01-29 DIAGNOSIS — N186 End stage renal disease: Secondary | ICD-10-CM | POA: Diagnosis not present

## 2018-01-29 DIAGNOSIS — D509 Iron deficiency anemia, unspecified: Secondary | ICD-10-CM | POA: Diagnosis not present

## 2018-01-29 DIAGNOSIS — D631 Anemia in chronic kidney disease: Secondary | ICD-10-CM | POA: Diagnosis not present

## 2018-01-29 DIAGNOSIS — E119 Type 2 diabetes mellitus without complications: Secondary | ICD-10-CM | POA: Diagnosis not present

## 2018-01-29 DIAGNOSIS — Z992 Dependence on renal dialysis: Secondary | ICD-10-CM | POA: Diagnosis not present

## 2018-01-29 DIAGNOSIS — N2581 Secondary hyperparathyroidism of renal origin: Secondary | ICD-10-CM | POA: Diagnosis not present

## 2018-01-30 DIAGNOSIS — N186 End stage renal disease: Secondary | ICD-10-CM | POA: Diagnosis not present

## 2018-01-30 DIAGNOSIS — E119 Type 2 diabetes mellitus without complications: Secondary | ICD-10-CM | POA: Diagnosis not present

## 2018-01-30 DIAGNOSIS — Z992 Dependence on renal dialysis: Secondary | ICD-10-CM | POA: Diagnosis not present

## 2018-01-30 DIAGNOSIS — D631 Anemia in chronic kidney disease: Secondary | ICD-10-CM | POA: Diagnosis not present

## 2018-01-30 DIAGNOSIS — D509 Iron deficiency anemia, unspecified: Secondary | ICD-10-CM | POA: Diagnosis not present

## 2018-01-30 DIAGNOSIS — N2581 Secondary hyperparathyroidism of renal origin: Secondary | ICD-10-CM | POA: Diagnosis not present

## 2018-02-01 DIAGNOSIS — I482 Chronic atrial fibrillation: Secondary | ICD-10-CM | POA: Diagnosis not present

## 2018-02-01 DIAGNOSIS — D631 Anemia in chronic kidney disease: Secondary | ICD-10-CM | POA: Diagnosis not present

## 2018-02-01 DIAGNOSIS — N186 End stage renal disease: Secondary | ICD-10-CM | POA: Diagnosis not present

## 2018-02-01 DIAGNOSIS — D509 Iron deficiency anemia, unspecified: Secondary | ICD-10-CM | POA: Diagnosis not present

## 2018-02-01 DIAGNOSIS — N2581 Secondary hyperparathyroidism of renal origin: Secondary | ICD-10-CM | POA: Diagnosis not present

## 2018-02-01 DIAGNOSIS — E119 Type 2 diabetes mellitus without complications: Secondary | ICD-10-CM | POA: Diagnosis not present

## 2018-02-01 DIAGNOSIS — Z992 Dependence on renal dialysis: Secondary | ICD-10-CM | POA: Diagnosis not present

## 2018-02-03 DIAGNOSIS — Z992 Dependence on renal dialysis: Secondary | ICD-10-CM | POA: Diagnosis not present

## 2018-02-03 DIAGNOSIS — E119 Type 2 diabetes mellitus without complications: Secondary | ICD-10-CM | POA: Diagnosis not present

## 2018-02-03 DIAGNOSIS — D509 Iron deficiency anemia, unspecified: Secondary | ICD-10-CM | POA: Diagnosis not present

## 2018-02-03 DIAGNOSIS — D631 Anemia in chronic kidney disease: Secondary | ICD-10-CM | POA: Diagnosis not present

## 2018-02-03 DIAGNOSIS — N186 End stage renal disease: Secondary | ICD-10-CM | POA: Diagnosis not present

## 2018-02-03 DIAGNOSIS — N2581 Secondary hyperparathyroidism of renal origin: Secondary | ICD-10-CM | POA: Diagnosis not present

## 2018-02-05 DIAGNOSIS — D509 Iron deficiency anemia, unspecified: Secondary | ICD-10-CM | POA: Diagnosis not present

## 2018-02-05 DIAGNOSIS — N186 End stage renal disease: Secondary | ICD-10-CM | POA: Diagnosis not present

## 2018-02-05 DIAGNOSIS — N2581 Secondary hyperparathyroidism of renal origin: Secondary | ICD-10-CM | POA: Diagnosis not present

## 2018-02-05 DIAGNOSIS — Z992 Dependence on renal dialysis: Secondary | ICD-10-CM | POA: Diagnosis not present

## 2018-02-05 DIAGNOSIS — E119 Type 2 diabetes mellitus without complications: Secondary | ICD-10-CM | POA: Diagnosis not present

## 2018-02-05 DIAGNOSIS — D631 Anemia in chronic kidney disease: Secondary | ICD-10-CM | POA: Diagnosis not present

## 2018-02-06 DIAGNOSIS — E119 Type 2 diabetes mellitus without complications: Secondary | ICD-10-CM | POA: Diagnosis not present

## 2018-02-06 DIAGNOSIS — N186 End stage renal disease: Secondary | ICD-10-CM | POA: Diagnosis not present

## 2018-02-06 DIAGNOSIS — D631 Anemia in chronic kidney disease: Secondary | ICD-10-CM | POA: Diagnosis not present

## 2018-02-06 DIAGNOSIS — N2581 Secondary hyperparathyroidism of renal origin: Secondary | ICD-10-CM | POA: Diagnosis not present

## 2018-02-06 DIAGNOSIS — D509 Iron deficiency anemia, unspecified: Secondary | ICD-10-CM | POA: Diagnosis not present

## 2018-02-06 DIAGNOSIS — Z992 Dependence on renal dialysis: Secondary | ICD-10-CM | POA: Diagnosis not present

## 2018-02-08 DIAGNOSIS — I482 Chronic atrial fibrillation: Secondary | ICD-10-CM | POA: Diagnosis not present

## 2018-02-08 DIAGNOSIS — N2581 Secondary hyperparathyroidism of renal origin: Secondary | ICD-10-CM | POA: Diagnosis not present

## 2018-02-08 DIAGNOSIS — Z992 Dependence on renal dialysis: Secondary | ICD-10-CM | POA: Diagnosis not present

## 2018-02-08 DIAGNOSIS — D509 Iron deficiency anemia, unspecified: Secondary | ICD-10-CM | POA: Diagnosis not present

## 2018-02-08 DIAGNOSIS — E119 Type 2 diabetes mellitus without complications: Secondary | ICD-10-CM | POA: Diagnosis not present

## 2018-02-08 DIAGNOSIS — D631 Anemia in chronic kidney disease: Secondary | ICD-10-CM | POA: Diagnosis not present

## 2018-02-08 DIAGNOSIS — N186 End stage renal disease: Secondary | ICD-10-CM | POA: Diagnosis not present

## 2018-02-09 ENCOUNTER — Ambulatory Visit (INDEPENDENT_AMBULATORY_CARE_PROVIDER_SITE_OTHER): Payer: Medicare Other | Admitting: *Deleted

## 2018-02-09 DIAGNOSIS — Z5181 Encounter for therapeutic drug level monitoring: Secondary | ICD-10-CM | POA: Diagnosis not present

## 2018-02-09 DIAGNOSIS — I059 Rheumatic mitral valve disease, unspecified: Secondary | ICD-10-CM | POA: Diagnosis not present

## 2018-02-09 DIAGNOSIS — I079 Rheumatic tricuspid valve disease, unspecified: Secondary | ICD-10-CM

## 2018-02-09 LAB — POCT INR: INR: 1.1

## 2018-02-09 MED ORDER — WARFARIN SODIUM 2.5 MG PO TABS: ORAL_TABLET | ORAL | 0 refills | Status: DC

## 2018-02-09 NOTE — Patient Instructions (Addendum)
Description   Today and tomorrow take 2 tablets, then continue taking 1.5 tablets everyday except 1 tablet on Sundays and Thursdays. Recheck INR in one week. Coumadin Clinic [phone]

## 2018-02-10 ENCOUNTER — Other Ambulatory Visit: Payer: Self-pay | Admitting: Pharmacist

## 2018-02-10 DIAGNOSIS — N186 End stage renal disease: Secondary | ICD-10-CM | POA: Diagnosis not present

## 2018-02-10 DIAGNOSIS — D631 Anemia in chronic kidney disease: Secondary | ICD-10-CM | POA: Diagnosis not present

## 2018-02-10 DIAGNOSIS — D509 Iron deficiency anemia, unspecified: Secondary | ICD-10-CM | POA: Diagnosis not present

## 2018-02-10 DIAGNOSIS — N2581 Secondary hyperparathyroidism of renal origin: Secondary | ICD-10-CM | POA: Diagnosis not present

## 2018-02-10 DIAGNOSIS — E119 Type 2 diabetes mellitus without complications: Secondary | ICD-10-CM | POA: Diagnosis not present

## 2018-02-10 DIAGNOSIS — Z992 Dependence on renal dialysis: Secondary | ICD-10-CM | POA: Diagnosis not present

## 2018-02-10 NOTE — Patient Outreach (Signed)
[place] [facility]) Care Management  02/10/2018  Baldo Crissman [birth year] [id number]  Third outreach attempt to patient.  An outreach letter was also mailed to patient with no reply.    [place], [place] # [phone], was available on the phone to provide [name]  interpretation services.    No answer by patient today.   Plan:  Given inability to maintain contact with patient after three outreach attempts and a letter, will close this patient case.    Letter routed to PCP.    [name], PharmD, [place] [phone]

## 2018-02-12 DIAGNOSIS — D631 Anemia in chronic kidney disease: Secondary | ICD-10-CM | POA: Diagnosis not present

## 2018-02-12 DIAGNOSIS — N2581 Secondary hyperparathyroidism of renal origin: Secondary | ICD-10-CM | POA: Diagnosis not present

## 2018-02-12 DIAGNOSIS — Z992 Dependence on renal dialysis: Secondary | ICD-10-CM | POA: Diagnosis not present

## 2018-02-12 DIAGNOSIS — E119 Type 2 diabetes mellitus without complications: Secondary | ICD-10-CM | POA: Diagnosis not present

## 2018-02-12 DIAGNOSIS — D509 Iron deficiency anemia, unspecified: Secondary | ICD-10-CM | POA: Diagnosis not present

## 2018-02-12 DIAGNOSIS — N186 End stage renal disease: Secondary | ICD-10-CM | POA: Diagnosis not present

## 2018-02-13 DIAGNOSIS — D631 Anemia in chronic kidney disease: Secondary | ICD-10-CM | POA: Diagnosis not present

## 2018-02-13 DIAGNOSIS — N2581 Secondary hyperparathyroidism of renal origin: Secondary | ICD-10-CM | POA: Diagnosis not present

## 2018-02-13 DIAGNOSIS — N186 End stage renal disease: Secondary | ICD-10-CM | POA: Diagnosis not present

## 2018-02-13 DIAGNOSIS — Z992 Dependence on renal dialysis: Secondary | ICD-10-CM | POA: Diagnosis not present

## 2018-02-13 DIAGNOSIS — D509 Iron deficiency anemia, unspecified: Secondary | ICD-10-CM | POA: Diagnosis not present

## 2018-02-13 DIAGNOSIS — E119 Type 2 diabetes mellitus without complications: Secondary | ICD-10-CM | POA: Diagnosis not present

## 2018-02-15 DIAGNOSIS — I482 Chronic atrial fibrillation: Secondary | ICD-10-CM | POA: Diagnosis not present

## 2018-02-15 DIAGNOSIS — D509 Iron deficiency anemia, unspecified: Secondary | ICD-10-CM | POA: Diagnosis not present

## 2018-02-15 DIAGNOSIS — D631 Anemia in chronic kidney disease: Secondary | ICD-10-CM | POA: Diagnosis not present

## 2018-02-15 DIAGNOSIS — I5022 Chronic systolic (congestive) heart failure: Secondary | ICD-10-CM | POA: Diagnosis not present

## 2018-02-15 DIAGNOSIS — I509 Heart failure, unspecified: Secondary | ICD-10-CM | POA: Diagnosis not present

## 2018-02-15 DIAGNOSIS — Z992 Dependence on renal dialysis: Secondary | ICD-10-CM | POA: Diagnosis not present

## 2018-02-15 DIAGNOSIS — N186 End stage renal disease: Secondary | ICD-10-CM | POA: Diagnosis not present

## 2018-02-15 DIAGNOSIS — E876 Hypokalemia: Secondary | ICD-10-CM | POA: Diagnosis not present

## 2018-02-15 DIAGNOSIS — I50810 Right heart failure, unspecified: Secondary | ICD-10-CM | POA: Diagnosis not present

## 2018-02-15 DIAGNOSIS — N2581 Secondary hyperparathyroidism of renal origin: Secondary | ICD-10-CM | POA: Diagnosis not present

## 2018-02-15 DIAGNOSIS — I132 Hypertensive heart and chronic kidney disease with heart failure and with stage 5 chronic kidney disease, or end stage renal disease: Secondary | ICD-10-CM | POA: Diagnosis not present

## 2018-02-15 DIAGNOSIS — E119 Type 2 diabetes mellitus without complications: Secondary | ICD-10-CM | POA: Diagnosis not present

## 2018-02-17 DIAGNOSIS — D509 Iron deficiency anemia, unspecified: Secondary | ICD-10-CM | POA: Diagnosis not present

## 2018-02-17 DIAGNOSIS — N2581 Secondary hyperparathyroidism of renal origin: Secondary | ICD-10-CM | POA: Diagnosis not present

## 2018-02-17 DIAGNOSIS — N186 End stage renal disease: Secondary | ICD-10-CM | POA: Diagnosis not present

## 2018-02-17 DIAGNOSIS — D631 Anemia in chronic kidney disease: Secondary | ICD-10-CM | POA: Diagnosis not present

## 2018-02-17 DIAGNOSIS — Z992 Dependence on renal dialysis: Secondary | ICD-10-CM | POA: Diagnosis not present

## 2018-02-17 DIAGNOSIS — E119 Type 2 diabetes mellitus without complications: Secondary | ICD-10-CM | POA: Diagnosis not present

## 2018-02-18 ENCOUNTER — Ambulatory Visit (INDEPENDENT_AMBULATORY_CARE_PROVIDER_SITE_OTHER): Payer: Medicare Other | Admitting: Pharmacist

## 2018-02-18 DIAGNOSIS — Z5181 Encounter for therapeutic drug level monitoring: Secondary | ICD-10-CM

## 2018-02-18 DIAGNOSIS — I059 Rheumatic mitral valve disease, unspecified: Secondary | ICD-10-CM

## 2018-02-18 DIAGNOSIS — I079 Rheumatic tricuspid valve disease, unspecified: Secondary | ICD-10-CM | POA: Diagnosis not present

## 2018-02-18 LAB — POCT INR: INR: 1.7

## 2018-02-18 NOTE — Patient Instructions (Signed)
Description   Take 1.5 tablets today then change dose to taking 1.5 tablets everyday except 1 tablet on Thursdays. Recheck INR in 10 days. Coumadin Clinic [phone]

## 2018-02-19 DIAGNOSIS — D631 Anemia in chronic kidney disease: Secondary | ICD-10-CM | POA: Diagnosis not present

## 2018-02-19 DIAGNOSIS — E119 Type 2 diabetes mellitus without complications: Secondary | ICD-10-CM | POA: Diagnosis not present

## 2018-02-19 DIAGNOSIS — N2581 Secondary hyperparathyroidism of renal origin: Secondary | ICD-10-CM | POA: Diagnosis not present

## 2018-02-19 DIAGNOSIS — Z992 Dependence on renal dialysis: Secondary | ICD-10-CM | POA: Diagnosis not present

## 2018-02-19 DIAGNOSIS — N186 End stage renal disease: Secondary | ICD-10-CM | POA: Diagnosis not present

## 2018-02-19 DIAGNOSIS — D509 Iron deficiency anemia, unspecified: Secondary | ICD-10-CM | POA: Diagnosis not present

## 2018-02-20 DIAGNOSIS — D631 Anemia in chronic kidney disease: Secondary | ICD-10-CM | POA: Diagnosis not present

## 2018-02-20 DIAGNOSIS — N2581 Secondary hyperparathyroidism of renal origin: Secondary | ICD-10-CM | POA: Diagnosis not present

## 2018-02-20 DIAGNOSIS — D509 Iron deficiency anemia, unspecified: Secondary | ICD-10-CM | POA: Diagnosis not present

## 2018-02-20 DIAGNOSIS — N186 End stage renal disease: Secondary | ICD-10-CM | POA: Diagnosis not present

## 2018-02-20 DIAGNOSIS — Z992 Dependence on renal dialysis: Secondary | ICD-10-CM | POA: Diagnosis not present

## 2018-02-20 DIAGNOSIS — E119 Type 2 diabetes mellitus without complications: Secondary | ICD-10-CM | POA: Diagnosis not present

## 2018-02-22 DIAGNOSIS — N2581 Secondary hyperparathyroidism of renal origin: Secondary | ICD-10-CM | POA: Diagnosis not present

## 2018-02-22 DIAGNOSIS — E119 Type 2 diabetes mellitus without complications: Secondary | ICD-10-CM | POA: Diagnosis not present

## 2018-02-22 DIAGNOSIS — D631 Anemia in chronic kidney disease: Secondary | ICD-10-CM | POA: Diagnosis not present

## 2018-02-22 DIAGNOSIS — I482 Chronic atrial fibrillation: Secondary | ICD-10-CM | POA: Diagnosis not present

## 2018-02-22 DIAGNOSIS — Z992 Dependence on renal dialysis: Secondary | ICD-10-CM | POA: Diagnosis not present

## 2018-02-22 DIAGNOSIS — N186 End stage renal disease: Secondary | ICD-10-CM | POA: Diagnosis not present

## 2018-02-22 DIAGNOSIS — D509 Iron deficiency anemia, unspecified: Secondary | ICD-10-CM | POA: Diagnosis not present

## 2018-02-24 DIAGNOSIS — E119 Type 2 diabetes mellitus without complications: Secondary | ICD-10-CM | POA: Diagnosis not present

## 2018-02-24 DIAGNOSIS — Z992 Dependence on renal dialysis: Secondary | ICD-10-CM | POA: Diagnosis not present

## 2018-02-24 DIAGNOSIS — N186 End stage renal disease: Secondary | ICD-10-CM | POA: Diagnosis not present

## 2018-02-24 DIAGNOSIS — D509 Iron deficiency anemia, unspecified: Secondary | ICD-10-CM | POA: Diagnosis not present

## 2018-02-24 DIAGNOSIS — N2581 Secondary hyperparathyroidism of renal origin: Secondary | ICD-10-CM | POA: Diagnosis not present

## 2018-02-24 DIAGNOSIS — D631 Anemia in chronic kidney disease: Secondary | ICD-10-CM | POA: Diagnosis not present

## 2018-02-26 DIAGNOSIS — N186 End stage renal disease: Secondary | ICD-10-CM | POA: Diagnosis not present

## 2018-02-26 DIAGNOSIS — D509 Iron deficiency anemia, unspecified: Secondary | ICD-10-CM | POA: Diagnosis not present

## 2018-02-26 DIAGNOSIS — N2581 Secondary hyperparathyroidism of renal origin: Secondary | ICD-10-CM | POA: Diagnosis not present

## 2018-02-26 DIAGNOSIS — E119 Type 2 diabetes mellitus without complications: Secondary | ICD-10-CM | POA: Diagnosis not present

## 2018-02-26 DIAGNOSIS — Z992 Dependence on renal dialysis: Secondary | ICD-10-CM | POA: Diagnosis not present

## 2018-02-26 DIAGNOSIS — D631 Anemia in chronic kidney disease: Secondary | ICD-10-CM | POA: Diagnosis not present

## 2018-02-27 DIAGNOSIS — D509 Iron deficiency anemia, unspecified: Secondary | ICD-10-CM | POA: Diagnosis not present

## 2018-02-27 DIAGNOSIS — N186 End stage renal disease: Secondary | ICD-10-CM | POA: Diagnosis not present

## 2018-02-27 DIAGNOSIS — D631 Anemia in chronic kidney disease: Secondary | ICD-10-CM | POA: Diagnosis not present

## 2018-02-27 DIAGNOSIS — E119 Type 2 diabetes mellitus without complications: Secondary | ICD-10-CM | POA: Diagnosis not present

## 2018-02-27 DIAGNOSIS — Z992 Dependence on renal dialysis: Secondary | ICD-10-CM | POA: Diagnosis not present

## 2018-02-27 DIAGNOSIS — N2581 Secondary hyperparathyroidism of renal origin: Secondary | ICD-10-CM | POA: Diagnosis not present

## 2018-03-01 DIAGNOSIS — I482 Chronic atrial fibrillation: Secondary | ICD-10-CM | POA: Diagnosis not present

## 2018-03-01 DIAGNOSIS — E119 Type 2 diabetes mellitus without complications: Secondary | ICD-10-CM | POA: Diagnosis not present

## 2018-03-01 DIAGNOSIS — Z992 Dependence on renal dialysis: Secondary | ICD-10-CM | POA: Diagnosis not present

## 2018-03-01 DIAGNOSIS — N2581 Secondary hyperparathyroidism of renal origin: Secondary | ICD-10-CM | POA: Diagnosis not present

## 2018-03-01 DIAGNOSIS — N186 End stage renal disease: Secondary | ICD-10-CM | POA: Diagnosis not present

## 2018-03-01 DIAGNOSIS — D509 Iron deficiency anemia, unspecified: Secondary | ICD-10-CM | POA: Diagnosis not present

## 2018-03-01 DIAGNOSIS — D631 Anemia in chronic kidney disease: Secondary | ICD-10-CM | POA: Diagnosis not present

## 2018-03-02 ENCOUNTER — Ambulatory Visit (INDEPENDENT_AMBULATORY_CARE_PROVIDER_SITE_OTHER): Payer: Medicare Other | Admitting: *Deleted

## 2018-03-02 DIAGNOSIS — I059 Rheumatic mitral valve disease, unspecified: Secondary | ICD-10-CM

## 2018-03-02 DIAGNOSIS — Z5181 Encounter for therapeutic drug level monitoring: Secondary | ICD-10-CM | POA: Diagnosis not present

## 2018-03-02 DIAGNOSIS — I079 Rheumatic tricuspid valve disease, unspecified: Secondary | ICD-10-CM

## 2018-03-02 LAB — POCT INR: INR: 1.1

## 2018-03-03 ENCOUNTER — Other Ambulatory Visit: Payer: Self-pay | Admitting: Internal Medicine

## 2018-03-03 ENCOUNTER — Other Ambulatory Visit: Payer: Self-pay | Admitting: Family Medicine

## 2018-03-03 ENCOUNTER — Other Ambulatory Visit (HOSPITAL_COMMUNITY): Payer: Self-pay | Admitting: Student

## 2018-03-03 DIAGNOSIS — Z992 Dependence on renal dialysis: Secondary | ICD-10-CM | POA: Diagnosis not present

## 2018-03-03 DIAGNOSIS — N186 End stage renal disease: Secondary | ICD-10-CM | POA: Diagnosis not present

## 2018-03-03 DIAGNOSIS — D509 Iron deficiency anemia, unspecified: Secondary | ICD-10-CM | POA: Diagnosis not present

## 2018-03-03 DIAGNOSIS — D631 Anemia in chronic kidney disease: Secondary | ICD-10-CM | POA: Diagnosis not present

## 2018-03-03 DIAGNOSIS — E119 Type 2 diabetes mellitus without complications: Secondary | ICD-10-CM | POA: Diagnosis not present

## 2018-03-03 DIAGNOSIS — N2581 Secondary hyperparathyroidism of renal origin: Secondary | ICD-10-CM | POA: Diagnosis not present

## 2018-03-05 DIAGNOSIS — N2581 Secondary hyperparathyroidism of renal origin: Secondary | ICD-10-CM | POA: Diagnosis not present

## 2018-03-05 DIAGNOSIS — E119 Type 2 diabetes mellitus without complications: Secondary | ICD-10-CM | POA: Diagnosis not present

## 2018-03-05 DIAGNOSIS — D509 Iron deficiency anemia, unspecified: Secondary | ICD-10-CM | POA: Diagnosis not present

## 2018-03-05 DIAGNOSIS — N186 End stage renal disease: Secondary | ICD-10-CM | POA: Diagnosis not present

## 2018-03-05 DIAGNOSIS — D631 Anemia in chronic kidney disease: Secondary | ICD-10-CM | POA: Diagnosis not present

## 2018-03-05 DIAGNOSIS — Z992 Dependence on renal dialysis: Secondary | ICD-10-CM | POA: Diagnosis not present

## 2018-03-06 DIAGNOSIS — D631 Anemia in chronic kidney disease: Secondary | ICD-10-CM | POA: Diagnosis not present

## 2018-03-06 DIAGNOSIS — N2581 Secondary hyperparathyroidism of renal origin: Secondary | ICD-10-CM | POA: Diagnosis not present

## 2018-03-06 DIAGNOSIS — D509 Iron deficiency anemia, unspecified: Secondary | ICD-10-CM | POA: Diagnosis not present

## 2018-03-06 DIAGNOSIS — Z992 Dependence on renal dialysis: Secondary | ICD-10-CM | POA: Diagnosis not present

## 2018-03-06 DIAGNOSIS — E119 Type 2 diabetes mellitus without complications: Secondary | ICD-10-CM | POA: Diagnosis not present

## 2018-03-06 DIAGNOSIS — N186 End stage renal disease: Secondary | ICD-10-CM | POA: Diagnosis not present

## 2018-03-08 DIAGNOSIS — E119 Type 2 diabetes mellitus without complications: Secondary | ICD-10-CM | POA: Diagnosis not present

## 2018-03-08 DIAGNOSIS — I482 Chronic atrial fibrillation: Secondary | ICD-10-CM | POA: Diagnosis not present

## 2018-03-08 DIAGNOSIS — D509 Iron deficiency anemia, unspecified: Secondary | ICD-10-CM | POA: Diagnosis not present

## 2018-03-08 DIAGNOSIS — D631 Anemia in chronic kidney disease: Secondary | ICD-10-CM | POA: Diagnosis not present

## 2018-03-08 DIAGNOSIS — Z992 Dependence on renal dialysis: Secondary | ICD-10-CM | POA: Diagnosis not present

## 2018-03-08 DIAGNOSIS — N186 End stage renal disease: Secondary | ICD-10-CM | POA: Diagnosis not present

## 2018-03-08 DIAGNOSIS — N2581 Secondary hyperparathyroidism of renal origin: Secondary | ICD-10-CM | POA: Diagnosis not present

## 2018-03-09 ENCOUNTER — Ambulatory Visit (INDEPENDENT_AMBULATORY_CARE_PROVIDER_SITE_OTHER): Payer: Medicare Other | Admitting: *Deleted

## 2018-03-09 DIAGNOSIS — I482 Chronic atrial fibrillation, unspecified: Secondary | ICD-10-CM

## 2018-03-09 DIAGNOSIS — I079 Rheumatic tricuspid valve disease, unspecified: Secondary | ICD-10-CM

## 2018-03-09 DIAGNOSIS — I059 Rheumatic mitral valve disease, unspecified: Secondary | ICD-10-CM | POA: Diagnosis not present

## 2018-03-09 DIAGNOSIS — I4891 Unspecified atrial fibrillation: Secondary | ICD-10-CM | POA: Diagnosis not present

## 2018-03-09 DIAGNOSIS — Z5181 Encounter for therapeutic drug level monitoring: Secondary | ICD-10-CM | POA: Diagnosis not present

## 2018-03-09 LAB — POCT INR: INR: 1.2

## 2018-03-09 NOTE — Patient Instructions (Signed)
Description   Pt states he has already taken 2 tablets of coumadin today April23rd so instructed to take 2 tablets tomorrow April 24th then continue taking 1and 1/2 tablets everyday except 1 tablet on Thursdays. Recheck INR in 1 week. Coumadin Clinic [phone] Start taking coumadin in the morning

## 2018-03-10 DIAGNOSIS — N2581 Secondary hyperparathyroidism of renal origin: Secondary | ICD-10-CM | POA: Diagnosis not present

## 2018-03-10 DIAGNOSIS — D631 Anemia in chronic kidney disease: Secondary | ICD-10-CM | POA: Diagnosis not present

## 2018-03-10 DIAGNOSIS — Z992 Dependence on renal dialysis: Secondary | ICD-10-CM | POA: Diagnosis not present

## 2018-03-10 DIAGNOSIS — E119 Type 2 diabetes mellitus without complications: Secondary | ICD-10-CM | POA: Diagnosis not present

## 2018-03-10 DIAGNOSIS — D509 Iron deficiency anemia, unspecified: Secondary | ICD-10-CM | POA: Diagnosis not present

## 2018-03-10 DIAGNOSIS — N186 End stage renal disease: Secondary | ICD-10-CM | POA: Diagnosis not present

## 2018-03-12 DIAGNOSIS — Z992 Dependence on renal dialysis: Secondary | ICD-10-CM | POA: Diagnosis not present

## 2018-03-12 DIAGNOSIS — N2581 Secondary hyperparathyroidism of renal origin: Secondary | ICD-10-CM | POA: Diagnosis not present

## 2018-03-12 DIAGNOSIS — D631 Anemia in chronic kidney disease: Secondary | ICD-10-CM | POA: Diagnosis not present

## 2018-03-12 DIAGNOSIS — N186 End stage renal disease: Secondary | ICD-10-CM | POA: Diagnosis not present

## 2018-03-12 DIAGNOSIS — D509 Iron deficiency anemia, unspecified: Secondary | ICD-10-CM | POA: Diagnosis not present

## 2018-03-12 DIAGNOSIS — E119 Type 2 diabetes mellitus without complications: Secondary | ICD-10-CM | POA: Diagnosis not present

## 2018-03-13 DIAGNOSIS — D631 Anemia in chronic kidney disease: Secondary | ICD-10-CM | POA: Diagnosis not present

## 2018-03-13 DIAGNOSIS — E119 Type 2 diabetes mellitus without complications: Secondary | ICD-10-CM | POA: Diagnosis not present

## 2018-03-13 DIAGNOSIS — D509 Iron deficiency anemia, unspecified: Secondary | ICD-10-CM | POA: Diagnosis not present

## 2018-03-13 DIAGNOSIS — N186 End stage renal disease: Secondary | ICD-10-CM | POA: Diagnosis not present

## 2018-03-13 DIAGNOSIS — N2581 Secondary hyperparathyroidism of renal origin: Secondary | ICD-10-CM | POA: Diagnosis not present

## 2018-03-13 DIAGNOSIS — Z992 Dependence on renal dialysis: Secondary | ICD-10-CM | POA: Diagnosis not present

## 2018-03-15 DIAGNOSIS — I482 Chronic atrial fibrillation: Secondary | ICD-10-CM | POA: Diagnosis not present

## 2018-03-15 DIAGNOSIS — E119 Type 2 diabetes mellitus without complications: Secondary | ICD-10-CM | POA: Diagnosis not present

## 2018-03-15 DIAGNOSIS — N186 End stage renal disease: Secondary | ICD-10-CM | POA: Diagnosis not present

## 2018-03-15 DIAGNOSIS — D509 Iron deficiency anemia, unspecified: Secondary | ICD-10-CM | POA: Diagnosis not present

## 2018-03-15 DIAGNOSIS — D631 Anemia in chronic kidney disease: Secondary | ICD-10-CM | POA: Diagnosis not present

## 2018-03-15 DIAGNOSIS — N2581 Secondary hyperparathyroidism of renal origin: Secondary | ICD-10-CM | POA: Diagnosis not present

## 2018-03-15 DIAGNOSIS — Z992 Dependence on renal dialysis: Secondary | ICD-10-CM | POA: Diagnosis not present

## 2018-03-16 ENCOUNTER — Ambulatory Visit (INDEPENDENT_AMBULATORY_CARE_PROVIDER_SITE_OTHER): Payer: Medicare Other | Admitting: Pharmacist

## 2018-03-16 DIAGNOSIS — I059 Rheumatic mitral valve disease, unspecified: Secondary | ICD-10-CM

## 2018-03-16 DIAGNOSIS — I079 Rheumatic tricuspid valve disease, unspecified: Secondary | ICD-10-CM | POA: Diagnosis not present

## 2018-03-16 DIAGNOSIS — Z5181 Encounter for therapeutic drug level monitoring: Secondary | ICD-10-CM | POA: Diagnosis not present

## 2018-03-16 LAB — POCT INR: INR: 1.7

## 2018-03-16 NOTE — Patient Instructions (Signed)
Description   Take an extra 1/2 tablet tonight then change dose to 1and 1/2 tablets everyday. Recheck INR in 1 week. Coumadin Clinic [phone] Start taking coumadin in the morning

## 2018-03-17 DIAGNOSIS — D631 Anemia in chronic kidney disease: Secondary | ICD-10-CM | POA: Diagnosis not present

## 2018-03-17 DIAGNOSIS — N186 End stage renal disease: Secondary | ICD-10-CM | POA: Diagnosis not present

## 2018-03-17 DIAGNOSIS — Z992 Dependence on renal dialysis: Secondary | ICD-10-CM | POA: Diagnosis not present

## 2018-03-17 DIAGNOSIS — I5022 Chronic systolic (congestive) heart failure: Secondary | ICD-10-CM | POA: Diagnosis not present

## 2018-03-17 DIAGNOSIS — I509 Heart failure, unspecified: Secondary | ICD-10-CM | POA: Diagnosis not present

## 2018-03-17 DIAGNOSIS — E876 Hypokalemia: Secondary | ICD-10-CM | POA: Diagnosis not present

## 2018-03-17 DIAGNOSIS — E119 Type 2 diabetes mellitus without complications: Secondary | ICD-10-CM | POA: Diagnosis not present

## 2018-03-17 DIAGNOSIS — I132 Hypertensive heart and chronic kidney disease with heart failure and with stage 5 chronic kidney disease, or end stage renal disease: Secondary | ICD-10-CM | POA: Diagnosis not present

## 2018-03-17 DIAGNOSIS — N2581 Secondary hyperparathyroidism of renal origin: Secondary | ICD-10-CM | POA: Diagnosis not present

## 2018-03-17 DIAGNOSIS — D509 Iron deficiency anemia, unspecified: Secondary | ICD-10-CM | POA: Diagnosis not present

## 2018-03-17 DIAGNOSIS — I50810 Right heart failure, unspecified: Secondary | ICD-10-CM | POA: Diagnosis not present

## 2018-03-19 DIAGNOSIS — E876 Hypokalemia: Secondary | ICD-10-CM | POA: Diagnosis not present

## 2018-03-19 DIAGNOSIS — D631 Anemia in chronic kidney disease: Secondary | ICD-10-CM | POA: Diagnosis not present

## 2018-03-19 DIAGNOSIS — E119 Type 2 diabetes mellitus without complications: Secondary | ICD-10-CM | POA: Diagnosis not present

## 2018-03-19 DIAGNOSIS — N2581 Secondary hyperparathyroidism of renal origin: Secondary | ICD-10-CM | POA: Diagnosis not present

## 2018-03-19 DIAGNOSIS — N186 End stage renal disease: Secondary | ICD-10-CM | POA: Diagnosis not present

## 2018-03-19 DIAGNOSIS — D509 Iron deficiency anemia, unspecified: Secondary | ICD-10-CM | POA: Diagnosis not present

## 2018-03-20 DIAGNOSIS — E876 Hypokalemia: Secondary | ICD-10-CM | POA: Diagnosis not present

## 2018-03-20 DIAGNOSIS — E119 Type 2 diabetes mellitus without complications: Secondary | ICD-10-CM | POA: Diagnosis not present

## 2018-03-20 DIAGNOSIS — N2581 Secondary hyperparathyroidism of renal origin: Secondary | ICD-10-CM | POA: Diagnosis not present

## 2018-03-20 DIAGNOSIS — D631 Anemia in chronic kidney disease: Secondary | ICD-10-CM | POA: Diagnosis not present

## 2018-03-20 DIAGNOSIS — N186 End stage renal disease: Secondary | ICD-10-CM | POA: Diagnosis not present

## 2018-03-20 DIAGNOSIS — D509 Iron deficiency anemia, unspecified: Secondary | ICD-10-CM | POA: Diagnosis not present

## 2018-03-22 DIAGNOSIS — N186 End stage renal disease: Secondary | ICD-10-CM | POA: Diagnosis not present

## 2018-03-22 DIAGNOSIS — E876 Hypokalemia: Secondary | ICD-10-CM | POA: Diagnosis not present

## 2018-03-22 DIAGNOSIS — D509 Iron deficiency anemia, unspecified: Secondary | ICD-10-CM | POA: Diagnosis not present

## 2018-03-22 DIAGNOSIS — N2581 Secondary hyperparathyroidism of renal origin: Secondary | ICD-10-CM | POA: Diagnosis not present

## 2018-03-22 DIAGNOSIS — D631 Anemia in chronic kidney disease: Secondary | ICD-10-CM | POA: Diagnosis not present

## 2018-03-22 DIAGNOSIS — E119 Type 2 diabetes mellitus without complications: Secondary | ICD-10-CM | POA: Diagnosis not present

## 2018-03-22 DIAGNOSIS — I482 Chronic atrial fibrillation: Secondary | ICD-10-CM | POA: Diagnosis not present

## 2018-03-24 DIAGNOSIS — D509 Iron deficiency anemia, unspecified: Secondary | ICD-10-CM | POA: Diagnosis not present

## 2018-03-24 DIAGNOSIS — N2581 Secondary hyperparathyroidism of renal origin: Secondary | ICD-10-CM | POA: Diagnosis not present

## 2018-03-24 DIAGNOSIS — D631 Anemia in chronic kidney disease: Secondary | ICD-10-CM | POA: Diagnosis not present

## 2018-03-24 DIAGNOSIS — E876 Hypokalemia: Secondary | ICD-10-CM | POA: Diagnosis not present

## 2018-03-24 DIAGNOSIS — E119 Type 2 diabetes mellitus without complications: Secondary | ICD-10-CM | POA: Diagnosis not present

## 2018-03-24 DIAGNOSIS — N186 End stage renal disease: Secondary | ICD-10-CM | POA: Diagnosis not present

## 2018-03-25 ENCOUNTER — Ambulatory Visit: Payer: Medicare Other | Admitting: Internal Medicine

## 2018-03-25 DIAGNOSIS — I871 Compression of vein: Secondary | ICD-10-CM | POA: Diagnosis not present

## 2018-03-25 DIAGNOSIS — Z992 Dependence on renal dialysis: Secondary | ICD-10-CM | POA: Diagnosis not present

## 2018-03-25 DIAGNOSIS — I771 Stricture of artery: Secondary | ICD-10-CM | POA: Diagnosis not present

## 2018-03-25 DIAGNOSIS — N186 End stage renal disease: Secondary | ICD-10-CM | POA: Diagnosis not present

## 2018-03-26 ENCOUNTER — Encounter: Payer: Self-pay | Admitting: Internal Medicine

## 2018-03-26 DIAGNOSIS — E119 Type 2 diabetes mellitus without complications: Secondary | ICD-10-CM | POA: Diagnosis not present

## 2018-03-26 DIAGNOSIS — D631 Anemia in chronic kidney disease: Secondary | ICD-10-CM | POA: Diagnosis not present

## 2018-03-26 DIAGNOSIS — N186 End stage renal disease: Secondary | ICD-10-CM | POA: Diagnosis not present

## 2018-03-26 DIAGNOSIS — N2581 Secondary hyperparathyroidism of renal origin: Secondary | ICD-10-CM | POA: Diagnosis not present

## 2018-03-26 DIAGNOSIS — E876 Hypokalemia: Secondary | ICD-10-CM | POA: Diagnosis not present

## 2018-03-26 DIAGNOSIS — D509 Iron deficiency anemia, unspecified: Secondary | ICD-10-CM | POA: Diagnosis not present

## 2018-03-27 DIAGNOSIS — N2581 Secondary hyperparathyroidism of renal origin: Secondary | ICD-10-CM | POA: Diagnosis not present

## 2018-03-27 DIAGNOSIS — E119 Type 2 diabetes mellitus without complications: Secondary | ICD-10-CM | POA: Diagnosis not present

## 2018-03-27 DIAGNOSIS — D509 Iron deficiency anemia, unspecified: Secondary | ICD-10-CM | POA: Diagnosis not present

## 2018-03-27 DIAGNOSIS — N186 End stage renal disease: Secondary | ICD-10-CM | POA: Diagnosis not present

## 2018-03-27 DIAGNOSIS — D631 Anemia in chronic kidney disease: Secondary | ICD-10-CM | POA: Diagnosis not present

## 2018-03-27 DIAGNOSIS — E876 Hypokalemia: Secondary | ICD-10-CM | POA: Diagnosis not present

## 2018-03-29 DIAGNOSIS — E876 Hypokalemia: Secondary | ICD-10-CM | POA: Diagnosis not present

## 2018-03-29 DIAGNOSIS — N2581 Secondary hyperparathyroidism of renal origin: Secondary | ICD-10-CM | POA: Diagnosis not present

## 2018-03-29 DIAGNOSIS — I482 Chronic atrial fibrillation: Secondary | ICD-10-CM | POA: Diagnosis not present

## 2018-03-29 DIAGNOSIS — D631 Anemia in chronic kidney disease: Secondary | ICD-10-CM | POA: Diagnosis not present

## 2018-03-29 DIAGNOSIS — E119 Type 2 diabetes mellitus without complications: Secondary | ICD-10-CM | POA: Diagnosis not present

## 2018-03-29 DIAGNOSIS — N186 End stage renal disease: Secondary | ICD-10-CM | POA: Diagnosis not present

## 2018-03-29 DIAGNOSIS — D509 Iron deficiency anemia, unspecified: Secondary | ICD-10-CM | POA: Diagnosis not present

## 2018-03-29 LAB — PROTIME-INR: INR: 1.6 — AB (ref 0.9–1.1)

## 2018-03-30 ENCOUNTER — Ambulatory Visit (INDEPENDENT_AMBULATORY_CARE_PROVIDER_SITE_OTHER): Payer: Medicare Other | Admitting: Cardiology

## 2018-03-30 DIAGNOSIS — I4821 Permanent atrial fibrillation: Secondary | ICD-10-CM

## 2018-03-30 DIAGNOSIS — Z5181 Encounter for therapeutic drug level monitoring: Secondary | ICD-10-CM

## 2018-03-30 DIAGNOSIS — I482 Chronic atrial fibrillation, unspecified: Secondary | ICD-10-CM

## 2018-03-30 DIAGNOSIS — I079 Rheumatic tricuspid valve disease, unspecified: Secondary | ICD-10-CM

## 2018-03-30 DIAGNOSIS — I4891 Unspecified atrial fibrillation: Secondary | ICD-10-CM | POA: Diagnosis not present

## 2018-03-30 DIAGNOSIS — I059 Rheumatic mitral valve disease, unspecified: Secondary | ICD-10-CM

## 2018-03-30 NOTE — Patient Instructions (Signed)
Description   Spoke with pt and instructed to take  an extra 1/2 tablet today May 14th as he has already taken 1 and 1/2 tablets then change dose to 1and 1/2 tablets everyday except 2 tablets on Saturdays  Recheck INR in 2 weeks Coumadin Clinic [phone] Takes  coumadin in the morning Also spoke with pt's daughter Ilona Sorrel and left the above dosing on her machine as she instructed as I did speak with her earlier to get correct phone number for pt Pt's new number is [phone]

## 2018-03-31 DIAGNOSIS — N186 End stage renal disease: Secondary | ICD-10-CM | POA: Diagnosis not present

## 2018-03-31 DIAGNOSIS — E876 Hypokalemia: Secondary | ICD-10-CM | POA: Diagnosis not present

## 2018-03-31 DIAGNOSIS — D509 Iron deficiency anemia, unspecified: Secondary | ICD-10-CM | POA: Diagnosis not present

## 2018-03-31 DIAGNOSIS — D631 Anemia in chronic kidney disease: Secondary | ICD-10-CM | POA: Diagnosis not present

## 2018-03-31 DIAGNOSIS — E119 Type 2 diabetes mellitus without complications: Secondary | ICD-10-CM | POA: Diagnosis not present

## 2018-03-31 DIAGNOSIS — N2581 Secondary hyperparathyroidism of renal origin: Secondary | ICD-10-CM | POA: Diagnosis not present

## 2018-04-02 DIAGNOSIS — D631 Anemia in chronic kidney disease: Secondary | ICD-10-CM | POA: Diagnosis not present

## 2018-04-02 DIAGNOSIS — N2581 Secondary hyperparathyroidism of renal origin: Secondary | ICD-10-CM | POA: Diagnosis not present

## 2018-04-02 DIAGNOSIS — D509 Iron deficiency anemia, unspecified: Secondary | ICD-10-CM | POA: Diagnosis not present

## 2018-04-02 DIAGNOSIS — E119 Type 2 diabetes mellitus without complications: Secondary | ICD-10-CM | POA: Diagnosis not present

## 2018-04-02 DIAGNOSIS — N186 End stage renal disease: Secondary | ICD-10-CM | POA: Diagnosis not present

## 2018-04-02 DIAGNOSIS — E876 Hypokalemia: Secondary | ICD-10-CM | POA: Diagnosis not present

## 2018-04-03 DIAGNOSIS — D631 Anemia in chronic kidney disease: Secondary | ICD-10-CM | POA: Diagnosis not present

## 2018-04-03 DIAGNOSIS — N186 End stage renal disease: Secondary | ICD-10-CM | POA: Diagnosis not present

## 2018-04-03 DIAGNOSIS — E876 Hypokalemia: Secondary | ICD-10-CM | POA: Diagnosis not present

## 2018-04-03 DIAGNOSIS — N2581 Secondary hyperparathyroidism of renal origin: Secondary | ICD-10-CM | POA: Diagnosis not present

## 2018-04-03 DIAGNOSIS — D509 Iron deficiency anemia, unspecified: Secondary | ICD-10-CM | POA: Diagnosis not present

## 2018-04-03 DIAGNOSIS — E119 Type 2 diabetes mellitus without complications: Secondary | ICD-10-CM | POA: Diagnosis not present

## 2018-04-05 DIAGNOSIS — N186 End stage renal disease: Secondary | ICD-10-CM | POA: Diagnosis not present

## 2018-04-05 DIAGNOSIS — E119 Type 2 diabetes mellitus without complications: Secondary | ICD-10-CM | POA: Diagnosis not present

## 2018-04-05 DIAGNOSIS — I482 Chronic atrial fibrillation: Secondary | ICD-10-CM | POA: Diagnosis not present

## 2018-04-05 DIAGNOSIS — E876 Hypokalemia: Secondary | ICD-10-CM | POA: Diagnosis not present

## 2018-04-05 DIAGNOSIS — N2581 Secondary hyperparathyroidism of renal origin: Secondary | ICD-10-CM | POA: Diagnosis not present

## 2018-04-05 DIAGNOSIS — D509 Iron deficiency anemia, unspecified: Secondary | ICD-10-CM | POA: Diagnosis not present

## 2018-04-05 DIAGNOSIS — D631 Anemia in chronic kidney disease: Secondary | ICD-10-CM | POA: Diagnosis not present

## 2018-04-07 DIAGNOSIS — D509 Iron deficiency anemia, unspecified: Secondary | ICD-10-CM | POA: Diagnosis not present

## 2018-04-07 DIAGNOSIS — N186 End stage renal disease: Secondary | ICD-10-CM | POA: Diagnosis not present

## 2018-04-07 DIAGNOSIS — N2581 Secondary hyperparathyroidism of renal origin: Secondary | ICD-10-CM | POA: Diagnosis not present

## 2018-04-07 DIAGNOSIS — D631 Anemia in chronic kidney disease: Secondary | ICD-10-CM | POA: Diagnosis not present

## 2018-04-07 DIAGNOSIS — E119 Type 2 diabetes mellitus without complications: Secondary | ICD-10-CM | POA: Diagnosis not present

## 2018-04-07 DIAGNOSIS — E876 Hypokalemia: Secondary | ICD-10-CM | POA: Diagnosis not present

## 2018-04-09 DIAGNOSIS — E119 Type 2 diabetes mellitus without complications: Secondary | ICD-10-CM | POA: Diagnosis not present

## 2018-04-09 DIAGNOSIS — N2581 Secondary hyperparathyroidism of renal origin: Secondary | ICD-10-CM | POA: Diagnosis not present

## 2018-04-09 DIAGNOSIS — E876 Hypokalemia: Secondary | ICD-10-CM | POA: Diagnosis not present

## 2018-04-09 DIAGNOSIS — D631 Anemia in chronic kidney disease: Secondary | ICD-10-CM | POA: Diagnosis not present

## 2018-04-09 DIAGNOSIS — D509 Iron deficiency anemia, unspecified: Secondary | ICD-10-CM | POA: Diagnosis not present

## 2018-04-09 DIAGNOSIS — N186 End stage renal disease: Secondary | ICD-10-CM | POA: Diagnosis not present

## 2018-04-10 DIAGNOSIS — D631 Anemia in chronic kidney disease: Secondary | ICD-10-CM | POA: Diagnosis not present

## 2018-04-10 DIAGNOSIS — D509 Iron deficiency anemia, unspecified: Secondary | ICD-10-CM | POA: Diagnosis not present

## 2018-04-10 DIAGNOSIS — N2581 Secondary hyperparathyroidism of renal origin: Secondary | ICD-10-CM | POA: Diagnosis not present

## 2018-04-10 DIAGNOSIS — E876 Hypokalemia: Secondary | ICD-10-CM | POA: Diagnosis not present

## 2018-04-10 DIAGNOSIS — N186 End stage renal disease: Secondary | ICD-10-CM | POA: Diagnosis not present

## 2018-04-10 DIAGNOSIS — E119 Type 2 diabetes mellitus without complications: Secondary | ICD-10-CM | POA: Diagnosis not present

## 2018-04-12 ENCOUNTER — Other Ambulatory Visit (HOSPITAL_COMMUNITY): Payer: Self-pay | Admitting: Student

## 2018-04-12 ENCOUNTER — Other Ambulatory Visit (HOSPITAL_COMMUNITY): Payer: Self-pay | Admitting: Internal Medicine

## 2018-04-12 DIAGNOSIS — E119 Type 2 diabetes mellitus without complications: Secondary | ICD-10-CM | POA: Diagnosis not present

## 2018-04-12 DIAGNOSIS — N2581 Secondary hyperparathyroidism of renal origin: Secondary | ICD-10-CM | POA: Diagnosis not present

## 2018-04-12 DIAGNOSIS — D509 Iron deficiency anemia, unspecified: Secondary | ICD-10-CM | POA: Diagnosis not present

## 2018-04-12 DIAGNOSIS — E876 Hypokalemia: Secondary | ICD-10-CM | POA: Diagnosis not present

## 2018-04-12 DIAGNOSIS — N186 End stage renal disease: Secondary | ICD-10-CM | POA: Diagnosis not present

## 2018-04-12 DIAGNOSIS — D631 Anemia in chronic kidney disease: Secondary | ICD-10-CM | POA: Diagnosis not present

## 2018-04-13 NOTE — Telephone Encounter (Signed)
Routing this to Dr. Harrington Challenger. Pt not seen in office with provider for over 1 year.  No show for appointment on 03/25/18.

## 2018-04-13 NOTE — Telephone Encounter (Signed)
Pt's pharmacy is requesting a refill on atorvastatin 20 mg tablet. Would Dr. Harrington Challenger like to refill this medication? Please address

## 2018-04-13 NOTE — Telephone Encounter (Signed)
Pt's pharmacy is requesting a refill on calcium acetate 667 mg capsule. Would Dr. Harrington Challenger like to refill this medication? Please address

## 2018-04-14 DIAGNOSIS — E119 Type 2 diabetes mellitus without complications: Secondary | ICD-10-CM | POA: Diagnosis not present

## 2018-04-14 DIAGNOSIS — E876 Hypokalemia: Secondary | ICD-10-CM | POA: Diagnosis not present

## 2018-04-14 DIAGNOSIS — D509 Iron deficiency anemia, unspecified: Secondary | ICD-10-CM | POA: Diagnosis not present

## 2018-04-14 DIAGNOSIS — N2581 Secondary hyperparathyroidism of renal origin: Secondary | ICD-10-CM | POA: Diagnosis not present

## 2018-04-14 DIAGNOSIS — N186 End stage renal disease: Secondary | ICD-10-CM | POA: Diagnosis not present

## 2018-04-14 DIAGNOSIS — D631 Anemia in chronic kidney disease: Secondary | ICD-10-CM | POA: Diagnosis not present

## 2018-04-15 ENCOUNTER — Ambulatory Visit (INDEPENDENT_AMBULATORY_CARE_PROVIDER_SITE_OTHER): Payer: Medicare Other

## 2018-04-15 DIAGNOSIS — I059 Rheumatic mitral valve disease, unspecified: Secondary | ICD-10-CM | POA: Diagnosis not present

## 2018-04-15 DIAGNOSIS — Z5181 Encounter for therapeutic drug level monitoring: Secondary | ICD-10-CM | POA: Diagnosis not present

## 2018-04-15 DIAGNOSIS — I079 Rheumatic tricuspid valve disease, unspecified: Secondary | ICD-10-CM

## 2018-04-15 LAB — POCT INR: INR: 1.4 — AB (ref 2.0–3.0)

## 2018-04-15 MED ORDER — WARFARIN SODIUM 2.5 MG PO TABS: ORAL_TABLET | ORAL | 1 refills | Status: DC

## 2018-04-15 NOTE — Telephone Encounter (Signed)
Pt needs to f/u back in clinic    Can fill for one month only

## 2018-04-15 NOTE — Telephone Encounter (Signed)
I am not the one to order this    Pt should have primary MD or renal

## 2018-04-15 NOTE — Telephone Encounter (Signed)
Please ask pharmacy to request this medication from PCP or kidney doctor.

## 2018-04-15 NOTE — Telephone Encounter (Signed)
Please refill for 30 day supply.  No refills. Will refill at next clinic appointment.

## 2018-04-15 NOTE — Telephone Encounter (Signed)
Left message for patient to call back. Has appt today at [facility].  Will discuss with him at this time.

## 2018-04-15 NOTE — Patient Instructions (Signed)
Description   Take 2 tablets today and tomorrow, then resume same dosage 1.5 tablets everyday except 2 tablets on Saturdays.  Recheck INR in 1 week. Coumadin Clinic [phone] Pt's new number is [phone]

## 2018-04-16 DIAGNOSIS — E876 Hypokalemia: Secondary | ICD-10-CM | POA: Diagnosis not present

## 2018-04-16 DIAGNOSIS — D631 Anemia in chronic kidney disease: Secondary | ICD-10-CM | POA: Diagnosis not present

## 2018-04-16 DIAGNOSIS — N2581 Secondary hyperparathyroidism of renal origin: Secondary | ICD-10-CM | POA: Diagnosis not present

## 2018-04-16 DIAGNOSIS — D509 Iron deficiency anemia, unspecified: Secondary | ICD-10-CM | POA: Diagnosis not present

## 2018-04-16 DIAGNOSIS — E119 Type 2 diabetes mellitus without complications: Secondary | ICD-10-CM | POA: Diagnosis not present

## 2018-04-16 DIAGNOSIS — N186 End stage renal disease: Secondary | ICD-10-CM | POA: Diagnosis not present

## 2018-04-17 DIAGNOSIS — E876 Hypokalemia: Secondary | ICD-10-CM | POA: Diagnosis not present

## 2018-04-17 DIAGNOSIS — I509 Heart failure, unspecified: Secondary | ICD-10-CM | POA: Diagnosis not present

## 2018-04-17 DIAGNOSIS — D631 Anemia in chronic kidney disease: Secondary | ICD-10-CM | POA: Diagnosis not present

## 2018-04-17 DIAGNOSIS — Z992 Dependence on renal dialysis: Secondary | ICD-10-CM | POA: Diagnosis not present

## 2018-04-17 DIAGNOSIS — D509 Iron deficiency anemia, unspecified: Secondary | ICD-10-CM | POA: Diagnosis not present

## 2018-04-17 DIAGNOSIS — N2581 Secondary hyperparathyroidism of renal origin: Secondary | ICD-10-CM | POA: Diagnosis not present

## 2018-04-17 DIAGNOSIS — I5022 Chronic systolic (congestive) heart failure: Secondary | ICD-10-CM | POA: Diagnosis not present

## 2018-04-17 DIAGNOSIS — I50810 Right heart failure, unspecified: Secondary | ICD-10-CM | POA: Diagnosis not present

## 2018-04-17 DIAGNOSIS — N186 End stage renal disease: Secondary | ICD-10-CM | POA: Diagnosis not present

## 2018-04-17 DIAGNOSIS — E119 Type 2 diabetes mellitus without complications: Secondary | ICD-10-CM | POA: Diagnosis not present

## 2018-04-17 DIAGNOSIS — I132 Hypertensive heart and chronic kidney disease with heart failure and with stage 5 chronic kidney disease, or end stage renal disease: Secondary | ICD-10-CM | POA: Diagnosis not present

## 2018-04-19 DIAGNOSIS — D631 Anemia in chronic kidney disease: Secondary | ICD-10-CM | POA: Diagnosis not present

## 2018-04-19 DIAGNOSIS — I482 Chronic atrial fibrillation: Secondary | ICD-10-CM | POA: Diagnosis not present

## 2018-04-19 DIAGNOSIS — E119 Type 2 diabetes mellitus without complications: Secondary | ICD-10-CM | POA: Diagnosis not present

## 2018-04-19 DIAGNOSIS — D509 Iron deficiency anemia, unspecified: Secondary | ICD-10-CM | POA: Diagnosis not present

## 2018-04-19 DIAGNOSIS — N2581 Secondary hyperparathyroidism of renal origin: Secondary | ICD-10-CM | POA: Diagnosis not present

## 2018-04-19 DIAGNOSIS — N186 End stage renal disease: Secondary | ICD-10-CM | POA: Diagnosis not present

## 2018-04-19 DIAGNOSIS — Z992 Dependence on renal dialysis: Secondary | ICD-10-CM | POA: Diagnosis not present

## 2018-04-21 DIAGNOSIS — D631 Anemia in chronic kidney disease: Secondary | ICD-10-CM | POA: Diagnosis not present

## 2018-04-21 DIAGNOSIS — Z992 Dependence on renal dialysis: Secondary | ICD-10-CM | POA: Diagnosis not present

## 2018-04-21 DIAGNOSIS — E119 Type 2 diabetes mellitus without complications: Secondary | ICD-10-CM | POA: Diagnosis not present

## 2018-04-21 DIAGNOSIS — N2581 Secondary hyperparathyroidism of renal origin: Secondary | ICD-10-CM | POA: Diagnosis not present

## 2018-04-21 DIAGNOSIS — N186 End stage renal disease: Secondary | ICD-10-CM | POA: Diagnosis not present

## 2018-04-21 DIAGNOSIS — D509 Iron deficiency anemia, unspecified: Secondary | ICD-10-CM | POA: Diagnosis not present

## 2018-04-22 ENCOUNTER — Ambulatory Visit (INDEPENDENT_AMBULATORY_CARE_PROVIDER_SITE_OTHER): Payer: Medicare Other | Admitting: *Deleted

## 2018-04-22 DIAGNOSIS — Z5181 Encounter for therapeutic drug level monitoring: Secondary | ICD-10-CM | POA: Diagnosis not present

## 2018-04-22 DIAGNOSIS — I079 Rheumatic tricuspid valve disease, unspecified: Secondary | ICD-10-CM

## 2018-04-22 DIAGNOSIS — I059 Rheumatic mitral valve disease, unspecified: Secondary | ICD-10-CM | POA: Diagnosis not present

## 2018-04-22 LAB — POCT INR: INR: 2.8 (ref 2.0–3.0)

## 2018-04-22 NOTE — Patient Instructions (Addendum)
Description   Start taking 1.5 tablets everyday.  Recheck INR in 1 week. Coumadin Clinic [phone] Pt's new number is [phone]

## 2018-04-23 DIAGNOSIS — D631 Anemia in chronic kidney disease: Secondary | ICD-10-CM | POA: Diagnosis not present

## 2018-04-23 DIAGNOSIS — D509 Iron deficiency anemia, unspecified: Secondary | ICD-10-CM | POA: Diagnosis not present

## 2018-04-23 DIAGNOSIS — N186 End stage renal disease: Secondary | ICD-10-CM | POA: Diagnosis not present

## 2018-04-23 DIAGNOSIS — N2581 Secondary hyperparathyroidism of renal origin: Secondary | ICD-10-CM | POA: Diagnosis not present

## 2018-04-23 DIAGNOSIS — E119 Type 2 diabetes mellitus without complications: Secondary | ICD-10-CM | POA: Diagnosis not present

## 2018-04-23 DIAGNOSIS — Z992 Dependence on renal dialysis: Secondary | ICD-10-CM | POA: Diagnosis not present

## 2018-04-24 DIAGNOSIS — Z992 Dependence on renal dialysis: Secondary | ICD-10-CM | POA: Diagnosis not present

## 2018-04-24 DIAGNOSIS — N186 End stage renal disease: Secondary | ICD-10-CM | POA: Diagnosis not present

## 2018-04-24 DIAGNOSIS — N2581 Secondary hyperparathyroidism of renal origin: Secondary | ICD-10-CM | POA: Diagnosis not present

## 2018-04-24 DIAGNOSIS — D631 Anemia in chronic kidney disease: Secondary | ICD-10-CM | POA: Diagnosis not present

## 2018-04-24 DIAGNOSIS — E119 Type 2 diabetes mellitus without complications: Secondary | ICD-10-CM | POA: Diagnosis not present

## 2018-04-24 DIAGNOSIS — D509 Iron deficiency anemia, unspecified: Secondary | ICD-10-CM | POA: Diagnosis not present

## 2018-04-26 DIAGNOSIS — D631 Anemia in chronic kidney disease: Secondary | ICD-10-CM | POA: Diagnosis not present

## 2018-04-26 DIAGNOSIS — Z992 Dependence on renal dialysis: Secondary | ICD-10-CM | POA: Diagnosis not present

## 2018-04-26 DIAGNOSIS — N186 End stage renal disease: Secondary | ICD-10-CM | POA: Diagnosis not present

## 2018-04-26 DIAGNOSIS — I482 Chronic atrial fibrillation: Secondary | ICD-10-CM | POA: Diagnosis not present

## 2018-04-26 DIAGNOSIS — E119 Type 2 diabetes mellitus without complications: Secondary | ICD-10-CM | POA: Diagnosis not present

## 2018-04-26 DIAGNOSIS — N2581 Secondary hyperparathyroidism of renal origin: Secondary | ICD-10-CM | POA: Diagnosis not present

## 2018-04-26 DIAGNOSIS — D509 Iron deficiency anemia, unspecified: Secondary | ICD-10-CM | POA: Diagnosis not present

## 2018-04-28 DIAGNOSIS — Z992 Dependence on renal dialysis: Secondary | ICD-10-CM | POA: Diagnosis not present

## 2018-04-28 DIAGNOSIS — E119 Type 2 diabetes mellitus without complications: Secondary | ICD-10-CM | POA: Diagnosis not present

## 2018-04-28 DIAGNOSIS — N2581 Secondary hyperparathyroidism of renal origin: Secondary | ICD-10-CM | POA: Diagnosis not present

## 2018-04-28 DIAGNOSIS — D631 Anemia in chronic kidney disease: Secondary | ICD-10-CM | POA: Diagnosis not present

## 2018-04-28 DIAGNOSIS — D509 Iron deficiency anemia, unspecified: Secondary | ICD-10-CM | POA: Diagnosis not present

## 2018-04-28 DIAGNOSIS — N186 End stage renal disease: Secondary | ICD-10-CM | POA: Diagnosis not present

## 2018-04-29 ENCOUNTER — Other Ambulatory Visit: Payer: Self-pay

## 2018-04-29 ENCOUNTER — Ambulatory Visit (INDEPENDENT_AMBULATORY_CARE_PROVIDER_SITE_OTHER): Payer: Medicare Other | Admitting: *Deleted

## 2018-04-29 DIAGNOSIS — I079 Rheumatic tricuspid valve disease, unspecified: Secondary | ICD-10-CM | POA: Diagnosis not present

## 2018-04-29 DIAGNOSIS — Z5181 Encounter for therapeutic drug level monitoring: Secondary | ICD-10-CM

## 2018-04-29 DIAGNOSIS — T82510A Breakdown (mechanical) of surgically created arteriovenous fistula, initial encounter: Secondary | ICD-10-CM

## 2018-04-29 DIAGNOSIS — I059 Rheumatic mitral valve disease, unspecified: Secondary | ICD-10-CM

## 2018-04-29 LAB — POCT INR: INR: 1.8 — AB (ref 2.0–3.0)

## 2018-04-29 MED ORDER — WARFARIN SODIUM 2.5 MG PO TABS: ORAL_TABLET | ORAL | 0 refills | Status: DC

## 2018-04-29 NOTE — Patient Instructions (Signed)
Description   Today take 2 tablets then continue taking 1.5 tablets everyday.  Recheck INR in 1 week. Coumadin Clinic [phone] Pt's new number is [phone]

## 2018-04-30 DIAGNOSIS — D631 Anemia in chronic kidney disease: Secondary | ICD-10-CM | POA: Diagnosis not present

## 2018-04-30 DIAGNOSIS — N2581 Secondary hyperparathyroidism of renal origin: Secondary | ICD-10-CM | POA: Diagnosis not present

## 2018-04-30 DIAGNOSIS — E119 Type 2 diabetes mellitus without complications: Secondary | ICD-10-CM | POA: Diagnosis not present

## 2018-04-30 DIAGNOSIS — D509 Iron deficiency anemia, unspecified: Secondary | ICD-10-CM | POA: Diagnosis not present

## 2018-04-30 DIAGNOSIS — N186 End stage renal disease: Secondary | ICD-10-CM | POA: Diagnosis not present

## 2018-04-30 DIAGNOSIS — Z992 Dependence on renal dialysis: Secondary | ICD-10-CM | POA: Diagnosis not present

## 2018-05-01 DIAGNOSIS — D631 Anemia in chronic kidney disease: Secondary | ICD-10-CM | POA: Diagnosis not present

## 2018-05-01 DIAGNOSIS — Z992 Dependence on renal dialysis: Secondary | ICD-10-CM | POA: Diagnosis not present

## 2018-05-01 DIAGNOSIS — N2581 Secondary hyperparathyroidism of renal origin: Secondary | ICD-10-CM | POA: Diagnosis not present

## 2018-05-01 DIAGNOSIS — N186 End stage renal disease: Secondary | ICD-10-CM | POA: Diagnosis not present

## 2018-05-01 DIAGNOSIS — E119 Type 2 diabetes mellitus without complications: Secondary | ICD-10-CM | POA: Diagnosis not present

## 2018-05-01 DIAGNOSIS — D509 Iron deficiency anemia, unspecified: Secondary | ICD-10-CM | POA: Diagnosis not present

## 2018-05-03 DIAGNOSIS — D631 Anemia in chronic kidney disease: Secondary | ICD-10-CM | POA: Diagnosis not present

## 2018-05-03 DIAGNOSIS — Z992 Dependence on renal dialysis: Secondary | ICD-10-CM | POA: Diagnosis not present

## 2018-05-03 DIAGNOSIS — D509 Iron deficiency anemia, unspecified: Secondary | ICD-10-CM | POA: Diagnosis not present

## 2018-05-03 DIAGNOSIS — E119 Type 2 diabetes mellitus without complications: Secondary | ICD-10-CM | POA: Diagnosis not present

## 2018-05-03 DIAGNOSIS — N2581 Secondary hyperparathyroidism of renal origin: Secondary | ICD-10-CM | POA: Diagnosis not present

## 2018-05-03 DIAGNOSIS — N186 End stage renal disease: Secondary | ICD-10-CM | POA: Diagnosis not present

## 2018-05-03 DIAGNOSIS — I482 Chronic atrial fibrillation: Secondary | ICD-10-CM | POA: Diagnosis not present

## 2018-05-05 DIAGNOSIS — E119 Type 2 diabetes mellitus without complications: Secondary | ICD-10-CM | POA: Diagnosis not present

## 2018-05-05 DIAGNOSIS — D631 Anemia in chronic kidney disease: Secondary | ICD-10-CM | POA: Diagnosis not present

## 2018-05-05 DIAGNOSIS — N2581 Secondary hyperparathyroidism of renal origin: Secondary | ICD-10-CM | POA: Diagnosis not present

## 2018-05-05 DIAGNOSIS — Z992 Dependence on renal dialysis: Secondary | ICD-10-CM | POA: Diagnosis not present

## 2018-05-05 DIAGNOSIS — N186 End stage renal disease: Secondary | ICD-10-CM | POA: Diagnosis not present

## 2018-05-05 DIAGNOSIS — D509 Iron deficiency anemia, unspecified: Secondary | ICD-10-CM | POA: Diagnosis not present

## 2018-05-06 ENCOUNTER — Ambulatory Visit (INDEPENDENT_AMBULATORY_CARE_PROVIDER_SITE_OTHER): Payer: Medicare Other

## 2018-05-06 DIAGNOSIS — I059 Rheumatic mitral valve disease, unspecified: Secondary | ICD-10-CM

## 2018-05-06 DIAGNOSIS — I079 Rheumatic tricuspid valve disease, unspecified: Secondary | ICD-10-CM | POA: Diagnosis not present

## 2018-05-06 DIAGNOSIS — Z5181 Encounter for therapeutic drug level monitoring: Secondary | ICD-10-CM

## 2018-05-06 LAB — POCT INR: INR: 2 (ref 2.0–3.0)

## 2018-05-06 NOTE — Patient Instructions (Signed)
Description   Continue on same dosage 1.5 tablets everyday.  Recheck INR in 1 week when seeing Dr Harrington Challenger per pt request. Coumadin Clinic [phone] Pt's new number is [phone]

## 2018-05-07 ENCOUNTER — Other Ambulatory Visit (HOSPITAL_COMMUNITY): Payer: Self-pay | Admitting: Internal Medicine

## 2018-05-08 DIAGNOSIS — Z992 Dependence on renal dialysis: Secondary | ICD-10-CM | POA: Diagnosis not present

## 2018-05-08 DIAGNOSIS — N2581 Secondary hyperparathyroidism of renal origin: Secondary | ICD-10-CM | POA: Diagnosis not present

## 2018-05-08 DIAGNOSIS — E119 Type 2 diabetes mellitus without complications: Secondary | ICD-10-CM | POA: Diagnosis not present

## 2018-05-08 DIAGNOSIS — N186 End stage renal disease: Secondary | ICD-10-CM | POA: Diagnosis not present

## 2018-05-08 DIAGNOSIS — D509 Iron deficiency anemia, unspecified: Secondary | ICD-10-CM | POA: Diagnosis not present

## 2018-05-08 DIAGNOSIS — D631 Anemia in chronic kidney disease: Secondary | ICD-10-CM | POA: Diagnosis not present

## 2018-05-10 DIAGNOSIS — E119 Type 2 diabetes mellitus without complications: Secondary | ICD-10-CM | POA: Diagnosis not present

## 2018-05-10 DIAGNOSIS — D631 Anemia in chronic kidney disease: Secondary | ICD-10-CM | POA: Diagnosis not present

## 2018-05-10 DIAGNOSIS — D509 Iron deficiency anemia, unspecified: Secondary | ICD-10-CM | POA: Diagnosis not present

## 2018-05-10 DIAGNOSIS — Z992 Dependence on renal dialysis: Secondary | ICD-10-CM | POA: Diagnosis not present

## 2018-05-10 DIAGNOSIS — N2581 Secondary hyperparathyroidism of renal origin: Secondary | ICD-10-CM | POA: Diagnosis not present

## 2018-05-10 DIAGNOSIS — N186 End stage renal disease: Secondary | ICD-10-CM | POA: Diagnosis not present

## 2018-05-10 DIAGNOSIS — I482 Chronic atrial fibrillation: Secondary | ICD-10-CM | POA: Diagnosis not present

## 2018-05-11 ENCOUNTER — Encounter: Payer: Self-pay | Admitting: Internal Medicine

## 2018-05-11 ENCOUNTER — Ambulatory Visit (INDEPENDENT_AMBULATORY_CARE_PROVIDER_SITE_OTHER): Payer: Medicare Other

## 2018-05-11 ENCOUNTER — Ambulatory Visit (INDEPENDENT_AMBULATORY_CARE_PROVIDER_SITE_OTHER): Payer: Medicare Other | Admitting: Internal Medicine

## 2018-05-11 VITALS — BP 100/70 | HR 124 | Ht 65.0 in | Wt 145.8 lb

## 2018-05-11 DIAGNOSIS — Z5181 Encounter for therapeutic drug level monitoring: Secondary | ICD-10-CM | POA: Diagnosis not present

## 2018-05-11 DIAGNOSIS — I2510 Atherosclerotic heart disease of native coronary artery without angina pectoris: Secondary | ICD-10-CM | POA: Diagnosis not present

## 2018-05-11 DIAGNOSIS — I482 Chronic atrial fibrillation: Secondary | ICD-10-CM | POA: Diagnosis not present

## 2018-05-11 DIAGNOSIS — I079 Rheumatic tricuspid valve disease, unspecified: Secondary | ICD-10-CM

## 2018-05-11 DIAGNOSIS — I5022 Chronic systolic (congestive) heart failure: Secondary | ICD-10-CM

## 2018-05-11 DIAGNOSIS — I059 Rheumatic mitral valve disease, unspecified: Secondary | ICD-10-CM

## 2018-05-11 DIAGNOSIS — I4821 Permanent atrial fibrillation: Secondary | ICD-10-CM

## 2018-05-11 LAB — POCT INR: INR: 2.2 (ref 2.0–3.0)

## 2018-05-11 NOTE — Progress Notes (Signed)
Cardiology Office Note   Date:  05/11/2018   ID:  Michael, [name] 29-Nov-1960, MRN [id number]  PCP:  Charlott Rakes, MD  Cardiologist:   [name], MD    F/U of systolic  CHF     History of Present Illness: Michael [name] is a 57 y.o. male with a history of mitral valve disease status post bioprosthetic mitral valve replacement in 2012 at [facility] in [place], tricuspid valve repair, CAD (s/p CABG), PAF (CHADSVASC 4), CKD, OSA, diabetes, HTN,hypotension HL, CHF, pulmonary hypertension, anemia, poor adherence with medications. He has been admitted with CHF in past   Lat admit was in Jan 2019 with SIRS Last echo in Jan 2019:  LVEF 50 to 55%   MV with mod MS of bioprosthesis.  No regurg  LAE.  RV dilated   RA dilated  Severe TR   I aw the pt in April 2018  The pt denies CP   Breathing is fair   No palpitations   No dizziness   Questioning was with assistance of translator  Outpatient Medications Prior to Visit  Medication Sig Dispense Refill  . acetaminophen (TYLENOL) 325 MG tablet Take 650 mg by mouth every 6 (six) hours.     Michael [name] allopurinol (ZYLOPRIM) 300 MG tablet Take 0.5 tablets (150 mg total) by mouth daily. (Patient taking differently: Take 150 mg by mouth at bedtime. ) 15 tablet 3  . atorvastatin (LIPITOR) 20 MG tablet TAKE 1 TABLET (20 MG TOTAL) BY MOUTH DAILY. PLEASE KEEP UPCOMING APPT. THANK YOU 30 tablet 0  . calcium acetate (PHOSLO) 667 MG capsule Take 1 capsule (667 mg total) by mouth 3 (three) times daily with meals. Please keep upcoming appt. Thank You 90 capsule 0  . colchicine 0.6 MG tablet Take 0.5 tablets (0.3 mg total) by mouth 2 (two) times a week. 30 tablet 0  . fluticasone (FLONASE) 50 MCG/ACT nasal spray Place 2 sprays into both nostrils daily. 16 g 0  . folic acid (FOLVITE) 1 MG tablet TAKE 1 TABLET BY MOUTH EVERY DAY 90 tablet 0  . hydrOXYzine (ATARAX/VISTARIL) 25 MG tablet 1/2-1 tablets tid prn itching 30 tablet 1  . metoprolol tartrate (LOPRESSOR)  25 MG tablet Take 0.5 tablets (12.5 mg total) by mouth 2 (two) times daily. (Patient taking differently: Take 25 mg by mouth daily. ) 60 tablet 0  . midodrine (PROAMATINE) 5 MG tablet Take 2 tablets (10 mg total) by mouth 3 (three) times daily with meals. (Patient taking differently: Take 5 mg by mouth 2 (two) times daily with a meal. Pt takes 10mg  by mouth three times per week-10mg  by mouth one hour prior to dialysis and 10mg  by mouth after the start of dialysis) 90 tablet 2  . multivitamin (RENA-VIT) TABS tablet Take 1 tablet by mouth at bedtime. 30 tablet 0  . omeprazole (PRILOSEC) 20 MG capsule Take 1 capsule (20 mg total) by mouth daily. 30 capsule 5  . oxyCODONE (OXY IR/ROXICODONE) 5 MG immediate release tablet Take 5 mg by mouth every 8 (eight) hours as needed for pain.    . OXYGEN Inhale 2 L into the lungs daily as needed.     Michael [name] tiZANidine (ZANAFLEX) 4 MG tablet Take 1 tablet (4 mg total) by mouth every 8 (eight) hours as needed for muscle spasms. 60 tablet 1  . traMADol (ULTRAM) 50 MG tablet Take 1 tablet (50 mg total) by mouth every 12 (twelve) hours as needed. 60 tablet 2  . warfarin (COUMADIN) 2.5  MG tablet Take 1 to 1.5 tablets daily as directed by Coumadin clinic 50 tablet 0   No facility-administered medications prior to visit.      Allergies:   Patient has no known allergies.   Past Medical History:  Diagnosis Date  . Anemia of chronic disease   . Asthma    said he does not know  . Chronic kidney disease (CKD), stage IV (severe) ([place])   . Chronic systolic CHF (congestive heart failure) (HCC)    a. prior EF normal; b. Echo 10/16: Mild LVH, EF 30-35%, mitral valve bioprosthesis present without evidence of stenosis or regurgitation, severe LAE, mild RVE with mild to moderately reduced RVSF, moderate RAE  . Cirrhosis ([name])   . Coronary artery disease   . Diabetes mellitus type 2 in obese ([name])   . Diabetes mellitus without complication ([place])   . Dyspnea   . GERD  (gastroesophageal reflux disease)   . H/O tricuspid valve repair 2012  . History of echocardiogram    Echo at [facility] in [facility] 4/12: mod LVH, EF 60-65%, mod LAE, tissue MVR ok, mod TR, mod pulmo HTN with RVSP 48 mmHg  . Hypertension   . Obstructive sleep apnea   . Paroxysmal atrial fibrillation (HCC)    s/p Maze procedure at time of MVR in 2011  . Pulmonary HTN ([place])   . 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 Thrombocytopenia (Michael [name])     Past Surgical History:  Procedure Laterality Date  . AV FISTULA PLACEMENT Left 07/13/2017   Procedure: LEFT ARM ARTERIOVENOUS (AV) FISTULA CREATION;  Surgeon: Waynetta Sandy, MD;  Location: [name];  Service: Vascular;  Laterality: Left;  . BASCILIC VEIN TRANSPOSITION Left 11/03/2017   Procedure: BASILIC VEIN TRANSPOSITION SECOND STAGE;  Surgeon: Waynetta Sandy, MD;  Location: [name];  Service: Vascular;  Laterality: Left;  . CARDIAC SURGERY    . CARDIAC VALVE REPLACEMENT  2011  . CARDIOVERSION N/A 07/09/2017   Procedure: CARDIOVERSION;  Surgeon: Larey Dresser, MD;  Location: [facility] ENDOSCOPY;  Service: Cardiovascular;  Laterality: N/A;  . COLONOSCOPY N/A 07/16/2017   Procedure: COLONOSCOPY;  Surgeon: Jerene Bears, MD;  Location: [name];  Service: Gastroenterology;  Laterality: N/A;  . ESOPHAGOGASTRODUODENOSCOPY N/A 07/16/2017   Procedure: ESOPHAGOGASTRODUODENOSCOPY (EGD);  Surgeon: Jerene Bears, MD;  Location: [name] ENDOSCOPY;  Service: Gastroenterology;  Laterality: N/A;  . INSERTION OF DIALYSIS CATHETER Right 07/13/2017   Procedure: INSERTION OF DIALYSIS CATHETER RIGHT INTERNAL JUGULAR;  Surgeon: Waynetta Sandy, MD;  Location: [name];  Service: Vascular;  Laterality: Right;  . RIGHT HEART CATH N/A 06/29/2017   Procedure: RIGHT HEART CATH;  Surgeon: Larey Dresser, MD;  Location: [name];  Service: Cardiovascular;  Laterality: N/A;  . RIGHT/LEFT HEART CATH AND CORONARY ANGIOGRAPHY N/A 08/19/2017     Procedure: RIGHT/LEFT HEART CATH AND CORONARY ANGIOGRAPHY;  Surgeon: Leonie Man, MD;  Location: [place] [name];  Service: Cardiovascular;  Laterality: N/A;  . TEE WITHOUT CARDIOVERSION N/A 07/09/2017   Procedure: TRANSESOPHAGEAL ECHOCARDIOGRAM (TEE);  Surgeon: Larey Dresser, MD;  Location: [facility] ENDOSCOPY;  Service: Cardiovascular;  Laterality: N/A;     Social History:  The patient  reports that he has quit smoking. His smoking use included cigarettes. He smoked 1.00 pack per day. He has never used smokeless tobacco. He reports that he does not drink alcohol or use drugs.   Family History:  The patient's family history is not on file.  ROS:  Please see the history of present illness. All other systems are reviewed and  Negative to the above problem except as noted.    PHYSICAL EXAM: VS:  BP 100/70   Pulse (!) 124   Ht 5\' 5"  (1.651 m)   Wt 66.1 kg (145 lb 12.8 oz)   SpO2 97%   BMI 24.26 kg/m   GEN: Obese and in no acute distress  HEENT: normal  Neck: JVP is invtrsdrf  carotid bruits, or masses Cardiac:Irreg; Tachy   no murmurs, rubs, or gallops,  No significant edema  Respiratory:  clear to auscultation bilaterally, normal work of breathing GI: soft, nontender, nondistended, + BS  No hepatomegaly  MS: no deformity Moving all extremities    R foot with erythma no dorsum  Tender   Skin: warm and dry, no rash Neuro:  Strength and sensation are intact Psych: euthymic mood, full affect   EKG:  EKG is not ordered today.   Lipid Panel    Component Value Date/Time   CHOL 85 11/24/2017 0521   CHOL 111 03/03/2017 1152   TRIG 76 11/24/2017 0521   HDL 28 (L) 11/24/2017 0521   HDL 36 (L) 03/03/2017 1152   CHOLHDL 3.0 11/24/2017 0521   VLDL 15 11/24/2017 0521   LDLCALC 42 11/24/2017 0521   LDLCALC 62 03/03/2017 1152      Wt Readings from Last 3 Encounters:  05/11/18 66.1 kg (145 lb 12.8 oz)  12/24/17 63.8 kg (140 lb 9.6 oz)  12/14/17 59.2 kg (130 lb 8.2 oz)       ASSESSMENT AND PLAN:  1  Chronic systolic CHF LVEF is normal.RVis dilated and function is moderately depressed   There was severe TR on echo with poor coaptation of valve leaflets   Pt with  Volume is not bad on exam  I would check labs today   2.  Afib   Rates are fast   I would set up for 24 hr holter   Cont coumadin   Check TSH  3  Blood pressure.   Pt on midodrine   BP 100   He denies dizziness  4.  CKD  Check bMET    5  Valvular dz  S/P MV replacement and TV repair  Echo as noted   Volume is not bad  6  CAD  I am not convinced of active angina  7   HL   Keep on statin      Current medicines are reviewed at length with the patient today.  The patient does not have concerns regarding medicines.    Labs/ tests ordered today include:  No orders of the defined types were placed in this encounter.     Signed, [name], MD  05/11/2018 12:26 PM    [address], [place], [place]  [address] Phone: [phone]; Fax: [phone]

## 2018-05-11 NOTE — Patient Instructions (Signed)
Your physician recommends that you continue on your current medications as directed. Please refer to the Current Medication list given to you today.  Your physician has recommended that you wear a holter monitor. Holter monitors are medical devices that record the heart's electrical activity. Doctors most often use these monitors to diagnose arrhythmias. Arrhythmias are problems with the speed or rhythm of the heartbeat. The monitor is a small, portable device. You can wear one while you do your normal daily activities. This is usually used to diagnose what is causing palpitations/syncope (passing out).  Your physician recommends that you return for lab work TODAY (cbc, bmet, tsh)  Your physician recommends that you schedule a follow-up appointment in: October with Dr. Harrington Challenger.

## 2018-05-11 NOTE — Patient Instructions (Signed)
Description   Continue on same dosage 1.5 tablets everyday.  Recheck INR in 3 weeks. [facility] [phone] Pt's new number is [phone]

## 2018-05-12 DIAGNOSIS — Z992 Dependence on renal dialysis: Secondary | ICD-10-CM | POA: Diagnosis not present

## 2018-05-12 DIAGNOSIS — D631 Anemia in chronic kidney disease: Secondary | ICD-10-CM | POA: Diagnosis not present

## 2018-05-12 DIAGNOSIS — D509 Iron deficiency anemia, unspecified: Secondary | ICD-10-CM | POA: Diagnosis not present

## 2018-05-12 DIAGNOSIS — N2581 Secondary hyperparathyroidism of renal origin: Secondary | ICD-10-CM | POA: Diagnosis not present

## 2018-05-12 DIAGNOSIS — N186 End stage renal disease: Secondary | ICD-10-CM | POA: Diagnosis not present

## 2018-05-12 DIAGNOSIS — E119 Type 2 diabetes mellitus without complications: Secondary | ICD-10-CM | POA: Diagnosis not present

## 2018-05-12 LAB — BASIC METABOLIC PANEL
BUN / CREAT RATIO: 8 — AB (ref 9–20)
BUN: 35 mg/dL — ABNORMAL HIGH (ref 6–24)
CO2: 27 mmol/L (ref 20–29)
Calcium: 9.6 mg/dL (ref 8.7–10.2)
Chloride: 105 mmol/L (ref 96–106)
Creatinine, Ser: 4.12 mg/dL — ABNORMAL HIGH (ref 0.76–1.27)
GFR calc Af Amer: 17 mL/min/{1.73_m2} — ABNORMAL LOW (ref >59)
GFR calc non Af Amer: 15 mL/min/{1.73_m2} — ABNORMAL LOW (ref >59)
Glucose: 69 mg/dL (ref 65–99)
Potassium: 4.8 mmol/L (ref 3.5–5.2)
SODIUM: 142 mmol/L (ref 134–144)

## 2018-05-12 LAB — CBC
Hematocrit: 32.7 % — ABNORMAL LOW (ref 37.5–51.0)
Hemoglobin: 10.8 g/dL — ABNORMAL LOW (ref 13.0–17.7)
MCH: 26.9 pg (ref 26.6–33.0)
MCHC: 33 g/dL (ref 31.5–35.7)
MCV: 81 fL (ref 79–97)
PLATELETS: 105 10*3/uL — AB (ref 150–450)
RBC: 4.02 x10E6/uL — ABNORMAL LOW (ref 4.14–5.80)
RDW: 16.5 % — AB (ref 12.3–15.4)
WBC: 4.3 10*3/uL (ref 3.4–10.8)

## 2018-05-12 LAB — TSH: TSH: 1.03 u[IU]/mL (ref 0.450–4.500)

## 2018-05-14 DIAGNOSIS — N2581 Secondary hyperparathyroidism of renal origin: Secondary | ICD-10-CM | POA: Diagnosis not present

## 2018-05-14 DIAGNOSIS — D631 Anemia in chronic kidney disease: Secondary | ICD-10-CM | POA: Diagnosis not present

## 2018-05-14 DIAGNOSIS — E119 Type 2 diabetes mellitus without complications: Secondary | ICD-10-CM | POA: Diagnosis not present

## 2018-05-14 DIAGNOSIS — D509 Iron deficiency anemia, unspecified: Secondary | ICD-10-CM | POA: Diagnosis not present

## 2018-05-14 DIAGNOSIS — Z992 Dependence on renal dialysis: Secondary | ICD-10-CM | POA: Diagnosis not present

## 2018-05-14 DIAGNOSIS — N186 End stage renal disease: Secondary | ICD-10-CM | POA: Diagnosis not present

## 2018-05-15 DIAGNOSIS — D631 Anemia in chronic kidney disease: Secondary | ICD-10-CM | POA: Diagnosis not present

## 2018-05-15 DIAGNOSIS — N186 End stage renal disease: Secondary | ICD-10-CM | POA: Diagnosis not present

## 2018-05-15 DIAGNOSIS — D509 Iron deficiency anemia, unspecified: Secondary | ICD-10-CM | POA: Diagnosis not present

## 2018-05-15 DIAGNOSIS — N2581 Secondary hyperparathyroidism of renal origin: Secondary | ICD-10-CM | POA: Diagnosis not present

## 2018-05-15 DIAGNOSIS — Z992 Dependence on renal dialysis: Secondary | ICD-10-CM | POA: Diagnosis not present

## 2018-05-15 DIAGNOSIS — E119 Type 2 diabetes mellitus without complications: Secondary | ICD-10-CM | POA: Diagnosis not present

## 2018-05-17 DIAGNOSIS — I50810 Right heart failure, unspecified: Secondary | ICD-10-CM | POA: Diagnosis not present

## 2018-05-17 DIAGNOSIS — E876 Hypokalemia: Secondary | ICD-10-CM | POA: Diagnosis not present

## 2018-05-17 DIAGNOSIS — D631 Anemia in chronic kidney disease: Secondary | ICD-10-CM | POA: Diagnosis not present

## 2018-05-17 DIAGNOSIS — I059 Rheumatic mitral valve disease, unspecified: Secondary | ICD-10-CM | POA: Diagnosis not present

## 2018-05-17 DIAGNOSIS — E119 Type 2 diabetes mellitus without complications: Secondary | ICD-10-CM | POA: Diagnosis not present

## 2018-05-17 DIAGNOSIS — I5022 Chronic systolic (congestive) heart failure: Secondary | ICD-10-CM | POA: Diagnosis not present

## 2018-05-17 DIAGNOSIS — I079 Rheumatic tricuspid valve disease, unspecified: Secondary | ICD-10-CM | POA: Diagnosis not present

## 2018-05-17 DIAGNOSIS — I509 Heart failure, unspecified: Secondary | ICD-10-CM | POA: Diagnosis not present

## 2018-05-17 DIAGNOSIS — N2581 Secondary hyperparathyroidism of renal origin: Secondary | ICD-10-CM | POA: Diagnosis not present

## 2018-05-17 DIAGNOSIS — D509 Iron deficiency anemia, unspecified: Secondary | ICD-10-CM | POA: Diagnosis not present

## 2018-05-17 DIAGNOSIS — Z992 Dependence on renal dialysis: Secondary | ICD-10-CM | POA: Diagnosis not present

## 2018-05-17 DIAGNOSIS — I482 Chronic atrial fibrillation: Secondary | ICD-10-CM | POA: Diagnosis not present

## 2018-05-17 DIAGNOSIS — I132 Hypertensive heart and chronic kidney disease with heart failure and with stage 5 chronic kidney disease, or end stage renal disease: Secondary | ICD-10-CM | POA: Diagnosis not present

## 2018-05-17 DIAGNOSIS — N186 End stage renal disease: Secondary | ICD-10-CM | POA: Diagnosis not present

## 2018-05-19 DIAGNOSIS — D631 Anemia in chronic kidney disease: Secondary | ICD-10-CM | POA: Diagnosis not present

## 2018-05-19 DIAGNOSIS — N2581 Secondary hyperparathyroidism of renal origin: Secondary | ICD-10-CM | POA: Diagnosis not present

## 2018-05-19 DIAGNOSIS — E876 Hypokalemia: Secondary | ICD-10-CM | POA: Diagnosis not present

## 2018-05-19 DIAGNOSIS — N186 End stage renal disease: Secondary | ICD-10-CM | POA: Diagnosis not present

## 2018-05-19 DIAGNOSIS — D509 Iron deficiency anemia, unspecified: Secondary | ICD-10-CM | POA: Diagnosis not present

## 2018-05-19 DIAGNOSIS — Z992 Dependence on renal dialysis: Secondary | ICD-10-CM | POA: Diagnosis not present

## 2018-05-21 DIAGNOSIS — D509 Iron deficiency anemia, unspecified: Secondary | ICD-10-CM | POA: Diagnosis not present

## 2018-05-21 DIAGNOSIS — D631 Anemia in chronic kidney disease: Secondary | ICD-10-CM | POA: Diagnosis not present

## 2018-05-21 DIAGNOSIS — E876 Hypokalemia: Secondary | ICD-10-CM | POA: Diagnosis not present

## 2018-05-21 DIAGNOSIS — Z992 Dependence on renal dialysis: Secondary | ICD-10-CM | POA: Diagnosis not present

## 2018-05-21 DIAGNOSIS — N186 End stage renal disease: Secondary | ICD-10-CM | POA: Diagnosis not present

## 2018-05-21 DIAGNOSIS — N2581 Secondary hyperparathyroidism of renal origin: Secondary | ICD-10-CM | POA: Diagnosis not present

## 2018-05-22 DIAGNOSIS — Z992 Dependence on renal dialysis: Secondary | ICD-10-CM | POA: Diagnosis not present

## 2018-05-22 DIAGNOSIS — N2581 Secondary hyperparathyroidism of renal origin: Secondary | ICD-10-CM | POA: Diagnosis not present

## 2018-05-22 DIAGNOSIS — E876 Hypokalemia: Secondary | ICD-10-CM | POA: Diagnosis not present

## 2018-05-22 DIAGNOSIS — D631 Anemia in chronic kidney disease: Secondary | ICD-10-CM | POA: Diagnosis not present

## 2018-05-22 DIAGNOSIS — N186 End stage renal disease: Secondary | ICD-10-CM | POA: Diagnosis not present

## 2018-05-22 DIAGNOSIS — D509 Iron deficiency anemia, unspecified: Secondary | ICD-10-CM | POA: Diagnosis not present

## 2018-05-24 DIAGNOSIS — E876 Hypokalemia: Secondary | ICD-10-CM | POA: Diagnosis not present

## 2018-05-24 DIAGNOSIS — D631 Anemia in chronic kidney disease: Secondary | ICD-10-CM | POA: Diagnosis not present

## 2018-05-24 DIAGNOSIS — D509 Iron deficiency anemia, unspecified: Secondary | ICD-10-CM | POA: Diagnosis not present

## 2018-05-24 DIAGNOSIS — N2581 Secondary hyperparathyroidism of renal origin: Secondary | ICD-10-CM | POA: Diagnosis not present

## 2018-05-24 DIAGNOSIS — I482 Chronic atrial fibrillation: Secondary | ICD-10-CM | POA: Diagnosis not present

## 2018-05-24 DIAGNOSIS — N186 End stage renal disease: Secondary | ICD-10-CM | POA: Diagnosis not present

## 2018-05-24 DIAGNOSIS — Z992 Dependence on renal dialysis: Secondary | ICD-10-CM | POA: Diagnosis not present

## 2018-05-24 LAB — PROTIME-INR: INR: 1.5 — AB (ref 0.9–1.1)

## 2018-05-25 ENCOUNTER — Ambulatory Visit (INDEPENDENT_AMBULATORY_CARE_PROVIDER_SITE_OTHER): Payer: Medicare Other | Admitting: Pharmacist

## 2018-05-25 DIAGNOSIS — I059 Rheumatic mitral valve disease, unspecified: Secondary | ICD-10-CM | POA: Diagnosis not present

## 2018-05-25 DIAGNOSIS — I079 Rheumatic tricuspid valve disease, unspecified: Secondary | ICD-10-CM | POA: Diagnosis not present

## 2018-05-25 DIAGNOSIS — Z5181 Encounter for therapeutic drug level monitoring: Secondary | ICD-10-CM

## 2018-05-26 DIAGNOSIS — D631 Anemia in chronic kidney disease: Secondary | ICD-10-CM | POA: Diagnosis not present

## 2018-05-26 DIAGNOSIS — E876 Hypokalemia: Secondary | ICD-10-CM | POA: Diagnosis not present

## 2018-05-26 DIAGNOSIS — Z992 Dependence on renal dialysis: Secondary | ICD-10-CM | POA: Diagnosis not present

## 2018-05-26 DIAGNOSIS — D509 Iron deficiency anemia, unspecified: Secondary | ICD-10-CM | POA: Diagnosis not present

## 2018-05-26 DIAGNOSIS — N2581 Secondary hyperparathyroidism of renal origin: Secondary | ICD-10-CM | POA: Diagnosis not present

## 2018-05-26 DIAGNOSIS — N186 End stage renal disease: Secondary | ICD-10-CM | POA: Diagnosis not present

## 2018-05-28 ENCOUNTER — Other Ambulatory Visit: Payer: Self-pay | Admitting: Internal Medicine

## 2018-05-28 DIAGNOSIS — D631 Anemia in chronic kidney disease: Secondary | ICD-10-CM | POA: Diagnosis not present

## 2018-05-28 DIAGNOSIS — N2581 Secondary hyperparathyroidism of renal origin: Secondary | ICD-10-CM | POA: Diagnosis not present

## 2018-05-28 DIAGNOSIS — Z992 Dependence on renal dialysis: Secondary | ICD-10-CM | POA: Diagnosis not present

## 2018-05-28 DIAGNOSIS — D509 Iron deficiency anemia, unspecified: Secondary | ICD-10-CM | POA: Diagnosis not present

## 2018-05-28 DIAGNOSIS — E876 Hypokalemia: Secondary | ICD-10-CM | POA: Diagnosis not present

## 2018-05-28 DIAGNOSIS — N186 End stage renal disease: Secondary | ICD-10-CM | POA: Diagnosis not present

## 2018-05-29 DIAGNOSIS — E876 Hypokalemia: Secondary | ICD-10-CM | POA: Diagnosis not present

## 2018-05-29 DIAGNOSIS — D509 Iron deficiency anemia, unspecified: Secondary | ICD-10-CM | POA: Diagnosis not present

## 2018-05-29 DIAGNOSIS — D631 Anemia in chronic kidney disease: Secondary | ICD-10-CM | POA: Diagnosis not present

## 2018-05-29 DIAGNOSIS — N186 End stage renal disease: Secondary | ICD-10-CM | POA: Diagnosis not present

## 2018-05-29 DIAGNOSIS — Z992 Dependence on renal dialysis: Secondary | ICD-10-CM | POA: Diagnosis not present

## 2018-05-29 DIAGNOSIS — N2581 Secondary hyperparathyroidism of renal origin: Secondary | ICD-10-CM | POA: Diagnosis not present

## 2018-05-31 DIAGNOSIS — D509 Iron deficiency anemia, unspecified: Secondary | ICD-10-CM | POA: Diagnosis not present

## 2018-05-31 DIAGNOSIS — D631 Anemia in chronic kidney disease: Secondary | ICD-10-CM | POA: Diagnosis not present

## 2018-05-31 DIAGNOSIS — E876 Hypokalemia: Secondary | ICD-10-CM | POA: Diagnosis not present

## 2018-05-31 DIAGNOSIS — N186 End stage renal disease: Secondary | ICD-10-CM | POA: Diagnosis not present

## 2018-05-31 DIAGNOSIS — Z992 Dependence on renal dialysis: Secondary | ICD-10-CM | POA: Diagnosis not present

## 2018-05-31 DIAGNOSIS — N2581 Secondary hyperparathyroidism of renal origin: Secondary | ICD-10-CM | POA: Diagnosis not present

## 2018-06-01 ENCOUNTER — Ambulatory Visit (INDEPENDENT_AMBULATORY_CARE_PROVIDER_SITE_OTHER): Payer: Medicare Other | Admitting: *Deleted

## 2018-06-01 ENCOUNTER — Ambulatory Visit (INDEPENDENT_AMBULATORY_CARE_PROVIDER_SITE_OTHER): Payer: Medicare Other

## 2018-06-01 DIAGNOSIS — I482 Chronic atrial fibrillation: Secondary | ICD-10-CM | POA: Diagnosis not present

## 2018-06-01 DIAGNOSIS — I4821 Permanent atrial fibrillation: Secondary | ICD-10-CM

## 2018-06-01 DIAGNOSIS — I079 Rheumatic tricuspid valve disease, unspecified: Secondary | ICD-10-CM | POA: Diagnosis not present

## 2018-06-01 DIAGNOSIS — Z5181 Encounter for therapeutic drug level monitoring: Secondary | ICD-10-CM | POA: Diagnosis not present

## 2018-06-01 DIAGNOSIS — I5022 Chronic systolic (congestive) heart failure: Secondary | ICD-10-CM

## 2018-06-01 DIAGNOSIS — I2510 Atherosclerotic heart disease of native coronary artery without angina pectoris: Secondary | ICD-10-CM | POA: Diagnosis not present

## 2018-06-01 DIAGNOSIS — I059 Rheumatic mitral valve disease, unspecified: Secondary | ICD-10-CM

## 2018-06-01 LAB — POCT INR: INR: 2.9 (ref 2.0–3.0)

## 2018-06-01 NOTE — Patient Instructions (Signed)
Description   Continue on same dosage 1.5 tablets everyday.  Recheck INR 2 weeks.  [facility] [phone] Pt's new number is [phone]

## 2018-06-02 DIAGNOSIS — N2581 Secondary hyperparathyroidism of renal origin: Secondary | ICD-10-CM | POA: Diagnosis not present

## 2018-06-02 DIAGNOSIS — E876 Hypokalemia: Secondary | ICD-10-CM | POA: Diagnosis not present

## 2018-06-02 DIAGNOSIS — N186 End stage renal disease: Secondary | ICD-10-CM | POA: Diagnosis not present

## 2018-06-02 DIAGNOSIS — Z992 Dependence on renal dialysis: Secondary | ICD-10-CM | POA: Diagnosis not present

## 2018-06-02 DIAGNOSIS — I482 Chronic atrial fibrillation: Secondary | ICD-10-CM | POA: Diagnosis not present

## 2018-06-02 DIAGNOSIS — D631 Anemia in chronic kidney disease: Secondary | ICD-10-CM | POA: Diagnosis not present

## 2018-06-02 DIAGNOSIS — D509 Iron deficiency anemia, unspecified: Secondary | ICD-10-CM | POA: Diagnosis not present

## 2018-06-04 DIAGNOSIS — D631 Anemia in chronic kidney disease: Secondary | ICD-10-CM | POA: Diagnosis not present

## 2018-06-04 DIAGNOSIS — Z992 Dependence on renal dialysis: Secondary | ICD-10-CM | POA: Diagnosis not present

## 2018-06-04 DIAGNOSIS — N2581 Secondary hyperparathyroidism of renal origin: Secondary | ICD-10-CM | POA: Diagnosis not present

## 2018-06-04 DIAGNOSIS — N186 End stage renal disease: Secondary | ICD-10-CM | POA: Diagnosis not present

## 2018-06-04 DIAGNOSIS — E876 Hypokalemia: Secondary | ICD-10-CM | POA: Diagnosis not present

## 2018-06-04 DIAGNOSIS — D509 Iron deficiency anemia, unspecified: Secondary | ICD-10-CM | POA: Diagnosis not present

## 2018-06-05 DIAGNOSIS — N186 End stage renal disease: Secondary | ICD-10-CM | POA: Diagnosis not present

## 2018-06-05 DIAGNOSIS — D509 Iron deficiency anemia, unspecified: Secondary | ICD-10-CM | POA: Diagnosis not present

## 2018-06-05 DIAGNOSIS — Z992 Dependence on renal dialysis: Secondary | ICD-10-CM | POA: Diagnosis not present

## 2018-06-05 DIAGNOSIS — E876 Hypokalemia: Secondary | ICD-10-CM | POA: Diagnosis not present

## 2018-06-05 DIAGNOSIS — N2581 Secondary hyperparathyroidism of renal origin: Secondary | ICD-10-CM | POA: Diagnosis not present

## 2018-06-05 DIAGNOSIS — D631 Anemia in chronic kidney disease: Secondary | ICD-10-CM | POA: Diagnosis not present

## 2018-06-06 ENCOUNTER — Other Ambulatory Visit (HOSPITAL_COMMUNITY): Payer: Self-pay | Admitting: Internal Medicine

## 2018-06-07 DIAGNOSIS — N186 End stage renal disease: Secondary | ICD-10-CM | POA: Diagnosis not present

## 2018-06-07 DIAGNOSIS — D631 Anemia in chronic kidney disease: Secondary | ICD-10-CM | POA: Diagnosis not present

## 2018-06-07 DIAGNOSIS — N2581 Secondary hyperparathyroidism of renal origin: Secondary | ICD-10-CM | POA: Diagnosis not present

## 2018-06-07 DIAGNOSIS — D509 Iron deficiency anemia, unspecified: Secondary | ICD-10-CM | POA: Diagnosis not present

## 2018-06-07 DIAGNOSIS — E876 Hypokalemia: Secondary | ICD-10-CM | POA: Diagnosis not present

## 2018-06-07 DIAGNOSIS — Z992 Dependence on renal dialysis: Secondary | ICD-10-CM | POA: Diagnosis not present

## 2018-06-07 DIAGNOSIS — I482 Chronic atrial fibrillation: Secondary | ICD-10-CM | POA: Diagnosis not present

## 2018-06-08 ENCOUNTER — Other Ambulatory Visit: Payer: Self-pay

## 2018-06-08 ENCOUNTER — Ambulatory Visit: Payer: Medicare Other | Admitting: Vascular Surgery

## 2018-06-08 ENCOUNTER — Ambulatory Visit (HOSPITAL_COMMUNITY): Payer: Medicare Other | Attending: Vascular Surgery

## 2018-06-08 DIAGNOSIS — I739 Peripheral vascular disease, unspecified: Secondary | ICD-10-CM

## 2018-06-09 ENCOUNTER — Emergency Department (HOSPITAL_COMMUNITY)
Admission: EM | Admit: 2018-06-09 | Discharge: 2018-06-09 | Disposition: A | Discharge status: Home / Self Care | Payer: Medicare Other | Attending: Emergency Medicine | Admitting: Emergency Medicine

## 2018-06-09 ENCOUNTER — Encounter (HOSPITAL_COMMUNITY): Payer: Self-pay | Admitting: Emergency Medicine

## 2018-06-09 DIAGNOSIS — N186 End stage renal disease: Secondary | ICD-10-CM | POA: Diagnosis not present

## 2018-06-09 DIAGNOSIS — E119 Type 2 diabetes mellitus without complications: Secondary | ICD-10-CM | POA: Diagnosis not present

## 2018-06-09 DIAGNOSIS — D509 Iron deficiency anemia, unspecified: Secondary | ICD-10-CM | POA: Diagnosis not present

## 2018-06-09 DIAGNOSIS — I2510 Atherosclerotic heart disease of native coronary artery without angina pectoris: Secondary | ICD-10-CM | POA: Diagnosis not present

## 2018-06-09 DIAGNOSIS — Z79899 Other long term (current) drug therapy: Secondary | ICD-10-CM | POA: Diagnosis not present

## 2018-06-09 DIAGNOSIS — I776 Arteritis, unspecified: Secondary | ICD-10-CM | POA: Diagnosis not present

## 2018-06-09 DIAGNOSIS — I5022 Chronic systolic (congestive) heart failure: Secondary | ICD-10-CM | POA: Insufficient documentation

## 2018-06-09 DIAGNOSIS — E1122 Type 2 diabetes mellitus with diabetic chronic kidney disease: Secondary | ICD-10-CM | POA: Insufficient documentation

## 2018-06-09 DIAGNOSIS — D631 Anemia in chronic kidney disease: Secondary | ICD-10-CM | POA: Diagnosis not present

## 2018-06-09 DIAGNOSIS — Z7901 Long term (current) use of anticoagulants: Secondary | ICD-10-CM | POA: Insufficient documentation

## 2018-06-09 DIAGNOSIS — I130 Hypertensive heart and chronic kidney disease with heart failure and stage 1 through stage 4 chronic kidney disease, or unspecified chronic kidney disease: Secondary | ICD-10-CM | POA: Diagnosis not present

## 2018-06-09 DIAGNOSIS — Z87891 Personal history of nicotine dependence: Secondary | ICD-10-CM | POA: Diagnosis not present

## 2018-06-09 DIAGNOSIS — N184 Chronic kidney disease, stage 4 (severe): Secondary | ICD-10-CM | POA: Diagnosis not present

## 2018-06-09 DIAGNOSIS — D696 Thrombocytopenia, unspecified: Secondary | ICD-10-CM | POA: Diagnosis not present

## 2018-06-09 DIAGNOSIS — R21 Rash and other nonspecific skin eruption: Secondary | ICD-10-CM | POA: Diagnosis present

## 2018-06-09 DIAGNOSIS — E876 Hypokalemia: Secondary | ICD-10-CM | POA: Diagnosis not present

## 2018-06-09 DIAGNOSIS — N2581 Secondary hyperparathyroidism of renal origin: Secondary | ICD-10-CM | POA: Diagnosis not present

## 2018-06-09 DIAGNOSIS — R52 Pain, unspecified: Secondary | ICD-10-CM | POA: Diagnosis not present

## 2018-06-09 DIAGNOSIS — Z992 Dependence on renal dialysis: Secondary | ICD-10-CM | POA: Diagnosis not present

## 2018-06-09 LAB — COMPREHENSIVE METABOLIC PANEL
ALT: 15 U/L (ref 0–44)
AST: 22 U/L (ref 15–41)
Albumin: 4.1 g/dL (ref 3.5–5.0)
Alkaline Phosphatase: 143 U/L — ABNORMAL HIGH (ref 38–126)
Anion gap: 12 (ref 5–15)
BILIRUBIN TOTAL: 1.7 mg/dL — AB (ref 0.3–1.2)
BUN: 20 mg/dL (ref 6–20)
CHLORIDE: 98 mmol/L (ref 98–111)
CO2: 29 mmol/L (ref 22–32)
CREATININE: 3.18 mg/dL — AB (ref 0.61–1.24)
Calcium: 8.7 mg/dL — ABNORMAL LOW (ref 8.9–10.3)
GFR, EST AFRICAN AMERICAN: 23 mL/min — AB (ref >60)
GFR, EST NON AFRICAN AMERICAN: 20 mL/min — AB (ref >60)
Glucose, Bld: 123 mg/dL — ABNORMAL HIGH (ref 70–99)
Potassium: 3.5 mmol/L (ref 3.5–5.1)
Sodium: 139 mmol/L (ref 135–145)
TOTAL PROTEIN: 8.1 g/dL (ref 6.5–8.1)

## 2018-06-09 LAB — CBC WITH DIFFERENTIAL/PLATELET
BASOS ABS: 0 10*3/uL (ref 0.0–0.1)
Basophils Relative: 1 %
EOS ABS: 0.1 10*3/uL (ref 0.0–0.7)
EOS PCT: 2 %
HCT: 33.1 % — ABNORMAL LOW (ref 39.0–52.0)
HEMOGLOBIN: 10.4 g/dL — AB (ref 13.0–17.0)
LYMPHS ABS: 1 10*3/uL (ref 0.7–4.0)
Lymphocytes Relative: 23 %
MCH: 27.6 pg (ref 26.0–34.0)
MCHC: 31.4 g/dL (ref 30.0–36.0)
MCV: 87.8 fL (ref 78.0–100.0)
MONO ABS: 0.7 10*3/uL (ref 0.1–1.0)
Monocytes Relative: 16 %
Neutro Abs: 2.7 10*3/uL (ref 1.7–7.7)
Neutrophils Relative %: 58 %
PLATELETS: 78 10*3/uL — AB (ref 150–400)
RBC: 3.77 MIL/uL — AB (ref 4.22–5.81)
RDW: 16.9 % — ABNORMAL HIGH (ref 11.5–15.5)
WBC: 4.5 10*3/uL (ref 4.0–10.5)

## 2018-06-09 LAB — PROTIME-INR
INR: 2.67
Prothrombin Time: 28.2 seconds — ABNORMAL HIGH (ref 11.4–15.2)

## 2018-06-09 LAB — APTT: aPTT: 47 seconds — ABNORMAL HIGH (ref 24–36)

## 2018-06-09 MED ORDER — TRAMADOL HCL 50 MG PO TABS
50.0000 mg | ORAL_TABLET | Freq: Once | ORAL | Status: AC
  Administered 2018-06-09: 50 mg via ORAL
  Filled 2018-06-09: qty 1

## 2018-06-09 MED ORDER — TRAMADOL HCL 50 MG PO TABS: 50.0000 mg | ORAL_TABLET | Freq: Two times a day (BID) | ORAL | 0 refills | Status: DC | PRN

## 2018-06-09 NOTE — ED Provider Notes (Addendum)
Patient placed in Quick Look pathway, seen and evaluated   Chief Complaint: feet pain and rash  HPI:  Michael [name] is a 57 y.o. male who presents to the ED with bilateral foot pain and a rash to his feet hands and face that started a week ago and has gotten worse.   ROS: Skin: rash  M/S: bilateral foot pain  Physical Exam:  BP 101/77   Pulse 78   Temp 97.8 F (36.6 C)   Resp 16   SpO2 99%    Gen: No distress  Neuro: Awake and Alert  Skin: rash to feet, lower legs left upper arm and face. Tender bilateral feet.      Initiation of care has begun. The patient has been counseled on the process, plan, and necessity for staying for the completion/evaluation, and the remainder of the medical screening examination     Ashley Murrain, NP 06/09/18 [place]    Debroah Baller Corinne, NP 06/12/18 1607    Virgel Manifold, MD 06/13/18 [phone]

## 2018-06-09 NOTE — ED Notes (Signed)
Patient verbalizes understanding of medications and discharge instructions. No further questions at this time. VSS and patient ambulatory at discharge.   

## 2018-06-09 NOTE — Discharge Instructions (Signed)
Please follow-up with the dermatologist as already scheduled.  If you prefer, you may contact dermatology specialists as listed above to see if you can get an earlier appointment. Please follow up with Dr. Julien Nordmann with regards to your low platelet counts.   Please follow up with your primary care provider within 5-7 days for re-evaluation of your symptoms. Please return to the emergency department for any new or worsening symptoms.

## 2018-06-09 NOTE — ED Notes (Signed)
ED Provider at bedside. 

## 2018-06-09 NOTE — ED Triage Notes (Addendum)
Pt arrives via EMS after completing his dialysis for c.o. 1 week of left sided foot pain, and a rash to bilateral feet. Rash is scab like to both feet/ankles. Denies insect bites. Rash is also noted to his mouth and some to the left hand.

## 2018-06-09 NOTE — ED Notes (Signed)
Patient ambulated to bathroom with minimal assistance.  

## 2018-06-09 NOTE — ED Provider Notes (Signed)
[facility] EMERGENCY DEPARTMENT Provider Note   CSN: [id number] Arrival date & time: 06/09/18  1718     History   Chief Complaint Chief Complaint  Patient presents with  . Foot Pain  . Rash    HPI Michael [name] is a 57 y.o. male.  HPI  Patient is a 57 year old male with a history of hypertension, chronic A. fib, T2 DM, ESRD (on dialysis M/W/F/Sat), dilated cardiomyopathy, CHF, who presents the emergency department today for evaluation of a rash that has been present for the last week. Rash has worsened since onset. Patient states that rash is present to his bilateral feet and bilateral upper extremities.  States rash is painful and intermittently itchy.  He has been taking tramadol at home with temporary relief of his symptoms.  Denies any associated fevers, myalgias, headaches, nausea, vomiting. No CP, SOB. Has never had sxs like this before. Denies any recent tick exposures.  Past Medical History:  Diagnosis Date  . Anemia of chronic disease   . Asthma    said he does not know  . Chronic kidney disease (CKD), stage IV (severe) ([place])   . Chronic systolic CHF (congestive heart failure) (HCC)    a. prior EF normal; b. Echo 10/16: Mild LVH, EF 30-35%, mitral valve bioprosthesis present without evidence of stenosis or regurgitation, severe LAE, mild RVE with mild to moderately reduced RVSF, moderate RAE  . Cirrhosis ([place])   . Coronary artery disease   . Diabetes mellitus type 2 in obese ([place])   . Diabetes mellitus without complication ([place])   . Dyspnea   . GERD (gastroesophageal reflux disease)   . H/O tricuspid valve repair 2012  . History of echocardiogram    Echo at [facility] 4/12: mod LVH, EF 60-65%, mod LAE, tissue MVR ok, mod TR, mod pulmo HTN with RVSP 48 mmHg  . Hypertension   . Obstructive sleep apnea   . Paroxysmal atrial fibrillation (HCC)    s/p Maze procedure at time of MVR in 2011  . Pulmonary HTN ([place])   . S/P mitral valve  replacement with porcine valve 2012  . Thrombocytopenia [facility])     Patient Active Problem List   Diagnosis Date Noted  . Pleural effusion   . SIRS (systemic inflammatory response syndrome) ([place]) 12/01/2017  . ESRD (end stage renal disease) ([place]) 12/01/2017  . CAD (coronary artery disease) 11/24/2017  . Hyperkalemia 11/24/2017  . Fluid overload 11/24/2017  . Elevated troponin 11/24/2017  . Dyspnea 09/18/2017  . Chronic renal failure   . Advance care planning   . Pulmonary hypertension, unspecified ([place])   . Chest pain   . Biventricular heart failure ([place]) 08/10/2017  . Syncope and collapse 08/10/2017  . Cardiac arrest ([place])   . Benign neoplasm of ascending colon   . Rectal bleeding   . ESRD (end stage renal disease) on dialysis ([place])   . Goals of care, counseling/discussion   . Palliative care by specialist   . Acute kidney injury ([place]) 06/25/2017  . Anasarca 05/30/2017  . HLD (hyperlipidemia) 10/05/2016  . Mouth bleeding 10/05/2016  . Bilateral knee pain 10/05/2016  . Petechial rash 10/05/2016  . Laceration of tongue   . Insomnia 06/10/2016  . Supratherapeutic INR 06/01/2016  . Acute hepatic encephalopathy 03/15/2016  . Liver cirrhosis ([place]) 03/15/2016  . Volume overload 03/15/2016  . Acute congestive heart failure ([place])   . Insulin dependent diabetes mellitus ([place])   . Acute renal failure (ARF) ([place]) 03/14/2016  .  GERD (gastroesophageal reflux disease) 03/04/2016  . Aortic regurgitation 02/27/2016  . Viral gastroenteritis 02/27/2016  . A-fib ([place]) 02/26/2016  . Abdominal pain 02/25/2016  . Diabetes mellitus with complication ([place]) 63/14/9702  . Permanent atrial fibrillation ([place]) 02/25/2016  . Congestive heart failure (CHF) ([place]) 02/25/2016  . Anemia, chronic disease 02/25/2016  . Atrial fibrillation with RVR ([place]) 02/19/2016  . Pain in joint, ankle and foot 01/10/2016  . Asthma   . Acute hypoxemic respiratory failure ([place]) 12/29/2015  . Acute respiratory failure  ([place]) 12/29/2015  . Chronic atrial fibrillation ([place]) 12/24/2015  . Dilated cardiomyopathy ([place]) 12/24/2015  . Polypharmacy 11/23/2015  . Gout 10/23/2015  . Encounter for therapeutic drug monitoring 10/19/2015  . Anemia - mild exacerbation 10/15/2015  . Tricuspid valve disease 10/09/2015  . Mitral valve disease   . Calf pain 09/10/2015  . Type II diabetes mellitus with renal manifestations ([place]) 09/10/2015  . Essential hypertension 09/10/2015    Past Surgical History:  Procedure Laterality Date  . AV FISTULA PLACEMENT Left 07/13/2017   Procedure: LEFT ARM ARTERIOVENOUS (AV) FISTULA CREATION;  Surgeon: Waynetta Sandy, MD;  Location: [facility];  Service: Vascular;  Laterality: Left;  . BASCILIC VEIN TRANSPOSITION Left 11/03/2017   Procedure: BASILIC VEIN TRANSPOSITION SECOND STAGE;  Surgeon: Waynetta Sandy, MD;  Location: [facility];  Service: Vascular;  Laterality: Left;  . CARDIAC SURGERY    . CARDIAC VALVE REPLACEMENT  2011  . CARDIOVERSION N/A 07/09/2017   Procedure: CARDIOVERSION;  Surgeon: Larey Dresser, MD;  Location: [facility] ENDOSCOPY;  Service: Cardiovascular;  Laterality: N/A;  . COLONOSCOPY N/A 07/16/2017   Procedure: COLONOSCOPY;  Surgeon: Jerene Bears, MD;  Location: [facility];  Service: Gastroenterology;  Laterality: N/A;  . ESOPHAGOGASTRODUODENOSCOPY N/A 07/16/2017   Procedure: ESOPHAGOGASTRODUODENOSCOPY (EGD);  Surgeon: Jerene Bears, MD;  Location: [facility] ENDOSCOPY;  Service: Gastroenterology;  Laterality: N/A;  . INSERTION OF DIALYSIS CATHETER Right 07/13/2017   Procedure: INSERTION OF DIALYSIS CATHETER RIGHT INTERNAL JUGULAR;  Surgeon: Waynetta Sandy, MD;  Location: [facility];  Service: Vascular;  Laterality: Right;  . RIGHT HEART CATH N/A 06/29/2017   Procedure: RIGHT HEART CATH;  Surgeon: Larey Dresser, MD;  Location: [place] CV LAB;  Service: Cardiovascular;  Laterality: N/A;  . RIGHT/LEFT HEART CATH AND CORONARY ANGIOGRAPHY N/A 08/19/2017    Procedure: RIGHT/LEFT HEART CATH AND CORONARY ANGIOGRAPHY;  Surgeon: Leonie Man, MD;  Location: [facility] CV LAB;  Service: Cardiovascular;  Laterality: N/A;  . TEE WITHOUT CARDIOVERSION N/A 07/09/2017   Procedure: TRANSESOPHAGEAL ECHOCARDIOGRAM (TEE);  Surgeon: Larey Dresser, MD;  Location: [facility] ENDOSCOPY;  Service: Cardiovascular;  Laterality: N/A;        Home Medications    Prior to Admission medications   Medication Sig Start Date End Date Taking? Authorizing Provider  acetaminophen (TYLENOL) 325 MG tablet Take 650 mg by mouth every 6 (six) hours.     [provider]  allopurinol (ZYLOPRIM) 300 MG tablet Take 0.5 tablets (150 mg total) by mouth daily. Patient taking differently: Take 150 mg by mouth at bedtime.  07/28/17   Shirley Friar, PA-C  atorvastatin (LIPITOR) 20 MG tablet TAKE 1 TABLET (20 MG TOTAL) BY MOUTH DAILY. PLEASE KEEP UPCOMING APPT. [place] YOU 06/07/18   Fay Records, MD  calcium acetate (PHOSLO) 667 MG capsule Take 1 capsule (667 mg total) by mouth 3 (three) times daily with meals. Please keep upcoming appt. Thank You 03/09/18   Fay Records, MD  colchicine  0.6 MG tablet Take 0.5 tablets (0.3 mg total) by mouth 2 (two) times a week. 09/21/17   Tawny Asal, MD  fluticasone (FLONASE) 50 MCG/ACT nasal spray Place 2 sprays into both nostrils daily. 12/14/17   Bonnielee Haff, MD  folic acid (FOLVITE) 1 MG tablet TAKE 1 TABLET BY MOUTH EVERY DAY 03/03/18   Charlott Rakes, MD  hydrOXYzine (ATARAX/VISTARIL) 25 MG tablet 1/2-1 tablets tid prn itching 12/24/17   Charlott Rakes, MD  metoprolol tartrate (LOPRESSOR) 25 MG tablet Take 0.5 tablets (12.5 mg total) by mouth 2 (two) times daily. Patient taking differently: Take 25 mg by mouth daily.  12/14/17   Bonnielee Haff, MD  midodrine (PROAMATINE) 5 MG tablet Take 2 tablets (10 mg total) by mouth 3 (three) times daily with meals. Patient taking differently: Take 5 mg by mouth 2 (two) times daily with a meal.  Pt takes 10mg  by mouth three times per week-10mg  by mouth one hour prior to dialysis and 10mg  by mouth after the start of dialysis 09/06/17   Kalman Shan Ratliff, DO  multivitamin (RENA-VIT) TABS tablet Take 1 tablet by mouth at bedtime. 12/14/17   Bonnielee Haff, MD  omeprazole (PRILOSEC) 20 MG capsule Take 1 capsule (20 mg total) by mouth daily. 03/03/17   Charlott Rakes, MD  oxyCODONE (OXY IR/ROXICODONE) 5 MG immediate release tablet Take 5 mg by mouth every 8 (eight) hours as needed for pain.    [provider]  OXYGEN Inhale 2 L into the lungs daily as needed.     [provider]  tiZANidine (ZANAFLEX) 4 MG tablet Take 1 tablet (4 mg total) by mouth every 8 (eight) hours as needed for muscle spasms. 12/24/17   Charlott Rakes, MD  traMADol (ULTRAM) 50 MG tablet Take 1 tablet (50 mg total) by mouth every 12 (twelve) hours as needed. 06/09/18   Antia Rahal S, PA-C  warfarin (COUMADIN) 2.5 MG tablet TAKE 1 TO 1.5 TABLETS DAILY AS DIRECTED BY COUMADIN CLINIC 05/28/18   Fay Records, MD    Family History Family History  Problem Relation Age of Onset  . Heart attack Neg Hx     Social History Social History   Tobacco Use  . Smoking status: Former Smoker    Packs/day: 1.00    Types: Cigarettes  . Smokeless tobacco: Never Used  Substance Use Topics  . Alcohol use: No  . Drug use: No     Allergies   Patient has no known allergies.   Review of Systems Review of Systems  Constitutional: Negative for chills and fever.  HENT: Negative for congestion.   Eyes: Negative for visual disturbance.  Respiratory: Negative for shortness of breath.   Cardiovascular: Negative for chest pain.  Gastrointestinal: Negative for abdominal pain, nausea and vomiting.  Genitourinary: Negative for dysuria.  Musculoskeletal: Negative for back pain.  Skin: Positive for rash.  Neurological: Negative for dizziness, weakness, light-headedness, numbness and headaches.    Physical  Exam Updated Vital Signs BP 101/77   Pulse 78   Temp 97.8 F (36.6 C)   Resp 16   SpO2 99%   Physical Exam  Constitutional: He appears well-developed and well-nourished.  Pt appears somewhat uncomfortable but without obvious distress  HENT:  Head: Normocephalic and atraumatic.  Eyes: Conjunctivae are normal.  Neck: Neck supple.  Cardiovascular: Normal heart sounds.  No murmur heard. Irregularly irregular  Pulmonary/Chest: Effort normal and breath sounds normal. No stridor. No respiratory distress. He has no wheezes.  Abdominal: Soft. Bowel  sounds are normal. He exhibits no distension. There is no tenderness. There is no guarding.  Musculoskeletal: He exhibits no edema.  Neurological: He is alert.  Skin: Skin is warm and dry. Capillary refill takes less than 2 seconds.  Raised erythematous papular rash that does not blanch. Some areas area scabbed over. Pictured below. No obvious evidence of superinfection.   Psychiatric: He has a normal mood and affect.  Nursing note and vitals reviewed.      ED Treatments / Results  Labs (all labs ordered are listed, but only abnormal results are displayed) Labs Reviewed  COMPREHENSIVE METABOLIC PANEL - Abnormal; Notable for the following components:      Result Value   Glucose, Bld 123 (*)    Creatinine, Ser 3.18 (*)    Calcium 8.7 (*)    Alkaline Phosphatase 143 (*)    Total Bilirubin 1.7 (*)    GFR calc non Af Amer 20 (*)    GFR calc Af Amer 23 (*)    All other components within normal limits  CBC WITH DIFFERENTIAL/PLATELET - Abnormal; Notable for the following components:   RBC 3.77 (*)    Hemoglobin 10.4 (*)    HCT 33.1 (*)    RDW 16.9 (*)    Platelets 78 (*)    All other components within normal limits  PROTIME-INR - Abnormal; Notable for the following components:   Prothrombin Time 28.2 (*)    All other components within normal limits  APTT - Abnormal; Notable for the following components:   aPTT 47 (*)    All other  components within normal limits  RPR    EKG None  Radiology No results found.  Procedures Procedures (including critical care time)  Medications Ordered in ED Medications  traMADol (ULTRAM) tablet 50 mg (50 mg Oral Given 06/09/18 2057)     Initial Impression / Assessment and Plan / ED Course  I have reviewed the triage vital signs and the nursing notes.  Pertinent labs & imaging results that were available during my care of the patient were reviewed by me and considered in my medical decision making (see chart for details).  Discussed pt presentation and exam findings with Dr. Tomi Bamberger, who evaluated the pt and agrees with the plan to discharge pt with pain medication and outpatient f/u.   Final Clinical Impressions(s) / ED Diagnoses   Final diagnoses:  Vasculitis ([place])  Thrombocytopenia ([place])   Pt presenting with rash to BLE and BUE for the last week. Rash is painful and intermittently pruritic. VSS, afebrile. Pt denies any other systemic sxs or tick exposures. His rash appears consistent with a vasculitis and he does have thrombocytopenia 78,000 today.  He has a history of this chronically and suspect that this is likely secondary to his chronic cirrhosis rather than from another cause.  Lower suspicion for [facility] spotted fever in this patient as he denies tick exposures or spending frequent amounts of time outside.  Doubt ITP or other emergent/urgent allergy of symptoms today.  His other labs appear to be at baseline for him, with a stable anemia.  CMP also appears to be at baseline.  PTT mildly elevated at 47.  PT/INR appropriately elevated to 2.67 (pt on chronic coumadin).  Feel that patient is appropriate for outpatient follow-up.  Will give him medication for pain for his symptoms.  He states that he has a follow-up with a dermatologist in August.  I gave him information for an additional dermatologist that he may  try to make an appointment with to see if he can be seen  earlier.  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  Advised him to follow-up with his PCP as well and to return to the ER if he has any new or worsening symptoms in the meantime including fevers, worsening rash, signs of infection.  Patient states he understands the plan and reasons to return.  All questions answered.   ED Discharge Orders        Ordered    traMADol (ULTRAM) 50 MG tablet  Every 12 hours PRN     06/09/18 2127       [place] 06/09/18 2138    Dorie Rank, MD 06/10/18 [phone]

## 2018-06-09 NOTE — ED Notes (Signed)
Patient given [place] sandwich and water, tolerating well.

## 2018-06-10 LAB — RPR: RPR Ser Ql: NONREACTIVE

## 2018-06-11 DIAGNOSIS — N2581 Secondary hyperparathyroidism of renal origin: Secondary | ICD-10-CM | POA: Diagnosis not present

## 2018-06-11 DIAGNOSIS — D631 Anemia in chronic kidney disease: Secondary | ICD-10-CM | POA: Diagnosis not present

## 2018-06-11 DIAGNOSIS — E876 Hypokalemia: Secondary | ICD-10-CM | POA: Diagnosis not present

## 2018-06-11 DIAGNOSIS — N186 End stage renal disease: Secondary | ICD-10-CM | POA: Diagnosis not present

## 2018-06-11 DIAGNOSIS — D509 Iron deficiency anemia, unspecified: Secondary | ICD-10-CM | POA: Diagnosis not present

## 2018-06-11 DIAGNOSIS — Z992 Dependence on renal dialysis: Secondary | ICD-10-CM | POA: Diagnosis not present

## 2018-06-12 DIAGNOSIS — D509 Iron deficiency anemia, unspecified: Secondary | ICD-10-CM | POA: Diagnosis not present

## 2018-06-12 DIAGNOSIS — N186 End stage renal disease: Secondary | ICD-10-CM | POA: Diagnosis not present

## 2018-06-12 DIAGNOSIS — Z992 Dependence on renal dialysis: Secondary | ICD-10-CM | POA: Diagnosis not present

## 2018-06-12 DIAGNOSIS — E876 Hypokalemia: Secondary | ICD-10-CM | POA: Diagnosis not present

## 2018-06-12 DIAGNOSIS — N2581 Secondary hyperparathyroidism of renal origin: Secondary | ICD-10-CM | POA: Diagnosis not present

## 2018-06-12 DIAGNOSIS — D631 Anemia in chronic kidney disease: Secondary | ICD-10-CM | POA: Diagnosis not present

## 2018-06-14 DIAGNOSIS — N2581 Secondary hyperparathyroidism of renal origin: Secondary | ICD-10-CM | POA: Diagnosis not present

## 2018-06-14 DIAGNOSIS — Z992 Dependence on renal dialysis: Secondary | ICD-10-CM | POA: Diagnosis not present

## 2018-06-14 DIAGNOSIS — E876 Hypokalemia: Secondary | ICD-10-CM | POA: Diagnosis not present

## 2018-06-14 DIAGNOSIS — D631 Anemia in chronic kidney disease: Secondary | ICD-10-CM | POA: Diagnosis not present

## 2018-06-14 DIAGNOSIS — I482 Chronic atrial fibrillation: Secondary | ICD-10-CM | POA: Diagnosis not present

## 2018-06-14 DIAGNOSIS — N186 End stage renal disease: Secondary | ICD-10-CM | POA: Diagnosis not present

## 2018-06-14 DIAGNOSIS — D509 Iron deficiency anemia, unspecified: Secondary | ICD-10-CM | POA: Diagnosis not present

## 2018-06-15 ENCOUNTER — Ambulatory Visit (INDEPENDENT_AMBULATORY_CARE_PROVIDER_SITE_OTHER): Payer: Medicare Other | Admitting: *Deleted

## 2018-06-15 DIAGNOSIS — Z5181 Encounter for therapeutic drug level monitoring: Secondary | ICD-10-CM

## 2018-06-15 DIAGNOSIS — I079 Rheumatic tricuspid valve disease, unspecified: Secondary | ICD-10-CM | POA: Diagnosis not present

## 2018-06-15 DIAGNOSIS — I059 Rheumatic mitral valve disease, unspecified: Secondary | ICD-10-CM

## 2018-06-15 LAB — POCT INR: INR: 3.2 — AB (ref 2.0–3.0)

## 2018-06-15 NOTE — Patient Instructions (Signed)
Description   Today take 1 tablet then continue on same dosage 1.5 tablets everyday.  Recheck INR 2 weeks.  C[facility]in Clinic [phone] Pt's new number is [phone]

## 2018-06-16 DIAGNOSIS — D509 Iron deficiency anemia, unspecified: Secondary | ICD-10-CM | POA: Diagnosis not present

## 2018-06-16 DIAGNOSIS — E876 Hypokalemia: Secondary | ICD-10-CM | POA: Diagnosis not present

## 2018-06-16 DIAGNOSIS — N2581 Secondary hyperparathyroidism of renal origin: Secondary | ICD-10-CM | POA: Diagnosis not present

## 2018-06-16 DIAGNOSIS — N186 End stage renal disease: Secondary | ICD-10-CM | POA: Diagnosis not present

## 2018-06-16 DIAGNOSIS — D631 Anemia in chronic kidney disease: Secondary | ICD-10-CM | POA: Diagnosis not present

## 2018-06-16 DIAGNOSIS — Z992 Dependence on renal dialysis: Secondary | ICD-10-CM | POA: Diagnosis not present

## 2018-06-16 DIAGNOSIS — I079 Rheumatic tricuspid valve disease, unspecified: Secondary | ICD-10-CM | POA: Diagnosis not present

## 2018-06-16 DIAGNOSIS — I059 Rheumatic mitral valve disease, unspecified: Secondary | ICD-10-CM | POA: Diagnosis not present

## 2018-06-17 DIAGNOSIS — N186 End stage renal disease: Secondary | ICD-10-CM | POA: Diagnosis not present

## 2018-06-17 DIAGNOSIS — I509 Heart failure, unspecified: Secondary | ICD-10-CM | POA: Diagnosis not present

## 2018-06-17 DIAGNOSIS — Z992 Dependence on renal dialysis: Secondary | ICD-10-CM | POA: Diagnosis not present

## 2018-06-18 ENCOUNTER — Encounter (HOSPITAL_COMMUNITY): Payer: Self-pay | Admitting: Emergency Medicine

## 2018-06-18 ENCOUNTER — Emergency Department (HOSPITAL_COMMUNITY)
Admission: EM | Admit: 2018-06-18 | Discharge: 2018-06-18 | Disposition: A | Discharge status: Home / Self Care | Payer: Medicare Other | Attending: Emergency Medicine | Admitting: Emergency Medicine

## 2018-06-18 DIAGNOSIS — Z952 Presence of prosthetic heart valve: Secondary | ICD-10-CM | POA: Insufficient documentation

## 2018-06-18 DIAGNOSIS — N186 End stage renal disease: Secondary | ICD-10-CM | POA: Insufficient documentation

## 2018-06-18 DIAGNOSIS — I132 Hypertensive heart and chronic kidney disease with heart failure and with stage 5 chronic kidney disease, or end stage renal disease: Secondary | ICD-10-CM | POA: Diagnosis not present

## 2018-06-18 DIAGNOSIS — E119 Type 2 diabetes mellitus without complications: Secondary | ICD-10-CM | POA: Diagnosis not present

## 2018-06-18 DIAGNOSIS — E1122 Type 2 diabetes mellitus with diabetic chronic kidney disease: Secondary | ICD-10-CM | POA: Insufficient documentation

## 2018-06-18 DIAGNOSIS — I5022 Chronic systolic (congestive) heart failure: Secondary | ICD-10-CM | POA: Insufficient documentation

## 2018-06-18 DIAGNOSIS — I776 Arteritis, unspecified: Secondary | ICD-10-CM | POA: Insufficient documentation

## 2018-06-18 DIAGNOSIS — Z79899 Other long term (current) drug therapy: Secondary | ICD-10-CM | POA: Diagnosis not present

## 2018-06-18 DIAGNOSIS — Z7901 Long term (current) use of anticoagulants: Secondary | ICD-10-CM | POA: Diagnosis not present

## 2018-06-18 DIAGNOSIS — I059 Rheumatic mitral valve disease, unspecified: Secondary | ICD-10-CM | POA: Diagnosis not present

## 2018-06-18 DIAGNOSIS — R000 Tachycardia, unspecified: Secondary | ICD-10-CM | POA: Diagnosis not present

## 2018-06-18 DIAGNOSIS — D631 Anemia in chronic kidney disease: Secondary | ICD-10-CM | POA: Diagnosis not present

## 2018-06-18 DIAGNOSIS — Z87891 Personal history of nicotine dependence: Secondary | ICD-10-CM | POA: Insufficient documentation

## 2018-06-18 DIAGNOSIS — L299 Pruritus, unspecified: Secondary | ICD-10-CM | POA: Diagnosis not present

## 2018-06-18 DIAGNOSIS — I779 Disorder of arteries and arterioles, unspecified: Secondary | ICD-10-CM | POA: Diagnosis not present

## 2018-06-18 DIAGNOSIS — R21 Rash and other nonspecific skin eruption: Secondary | ICD-10-CM | POA: Diagnosis not present

## 2018-06-18 DIAGNOSIS — I079 Rheumatic tricuspid valve disease, unspecified: Secondary | ICD-10-CM | POA: Diagnosis not present

## 2018-06-18 DIAGNOSIS — J45909 Unspecified asthma, uncomplicated: Secondary | ICD-10-CM | POA: Insufficient documentation

## 2018-06-18 DIAGNOSIS — I50810 Right heart failure, unspecified: Secondary | ICD-10-CM | POA: Diagnosis not present

## 2018-06-18 DIAGNOSIS — Z992 Dependence on renal dialysis: Secondary | ICD-10-CM | POA: Diagnosis not present

## 2018-06-18 DIAGNOSIS — D696 Thrombocytopenia, unspecified: Secondary | ICD-10-CM | POA: Diagnosis not present

## 2018-06-18 DIAGNOSIS — N2581 Secondary hyperparathyroidism of renal origin: Secondary | ICD-10-CM | POA: Diagnosis not present

## 2018-06-18 DIAGNOSIS — E876 Hypokalemia: Secondary | ICD-10-CM | POA: Diagnosis not present

## 2018-06-18 NOTE — ED Notes (Signed)
Patient requested to eat.  Will speak to APP

## 2018-06-18 NOTE — Discharge Instructions (Addendum)
You MUST follow-up with your dermatologist this coming Monday for your appointment and further evaluation of your rash. Please stop scratching your rash, this could cause infection and take longer to heal. Please return to the emergency department for any new or worsening symptoms. Please also follow-up with your primary care provider regarding your visit today.  Contact a health care provider if: Your symptoms return, or you have new symptoms. Your fever, fatigue, headache, or weight loss gets worse. You have signs of infection, such as fever, warmth, tenderness, redness, or swelling. Get help right away if: Your vision gets worse. Your pain does not go away, even after you take pain medicine. You have chest or stomach pain. You have trouble breathing. One side of your face or body suddenly becomes weak or numb. Your nose bleeds. There is blood in your urine.

## 2018-06-18 NOTE — ED Triage Notes (Signed)
Pt presents for third visit for spreading rash to face, arms, hands, legs and feet, painful.  Has had it for 2 weeks, and continues to spread.  Patient presents from dialysis.

## 2018-06-18 NOTE — ED Provider Notes (Signed)
[facility] EMERGENCY DEPARTMENT Provider Note   CSN: [id number] Arrival date & time: 06/18/18  1807     History   Chief Complaint Chief Complaint  Patient presents with  . Rash    HPI Michael [name] is a 57 y.o. male with a history of type 2 diabetes, hypertension, end-stage renal disease on dialysis, cardia myopathy, CHF who presents for rash that has been present for 3 weeks.  Patient with rash to bilateral lower extremities and left hand. Patient states that the rash is pruritic, he has been scratching and states that he has not taken any medication for this.  Patient has been seen here on 06/09/2018 for the same problem and encouraged to follow-up with the dermatologist.  Patient states that he has dermatology follow-up this coming Monday however while he was at the dialysis center today they encouraged him to come back to the emergency department because they thought that the rash looked worse today.  Patient is a denying all other associated symptoms at this time, denies fever, headache, nausea/vomiting, chest pain, shortness of breath, muscle soreness or joint pain.  HPI  Past Medical History:  Diagnosis Date  . Anemia of chronic disease   . Asthma    said he does not know  . Chronic kidney disease (CKD), stage IV (severe) ([place])   . Chronic systolic CHF (congestive heart failure) (HCC)    a. prior EF normal; b. Echo 10/16: Mild LVH, EF 30-35%, mitral valve bioprosthesis present without evidence of stenosis or regurgitation, severe LAE, mild RVE with mild to moderately reduced RVSF, moderate RAE  . Cirrhosis ([place])   . Coronary artery disease   . Diabetes mellitus type 2 in obese ([place])   . Diabetes mellitus without complication ([place])   . Dyspnea   . GERD (gastroesophageal reflux disease)   . H/O tricuspid valve repair 2012  . History of echocardiogram    Echo at [facility] in [facility] 4/12: mod LVH, EF 60-65%, mod LAE, tissue MVR ok, mod TR, mod pulmo HTN  with RVSP 48 mmHg  . Hypertension   . Obstructive sleep apnea   . Paroxysmal atrial fibrillation (HCC)    s/p Maze procedure at time of MVR in 2011  . Pulmonary HTN ([place])   . 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 Thrombocytopenia [facility])     Patient Active Problem List   Diagnosis Date Noted  . Pleural effusion   . SIRS (systemic inflammatory response syndrome) ([place]) 12/01/2017  . ESRD (end stage renal disease) ([place]) 12/01/2017  . CAD (coronary artery disease) 11/24/2017  . Hyperkalemia 11/24/2017  . Fluid overload 11/24/2017  . Elevated troponin 11/24/2017  . Dyspnea 09/18/2017  . Chronic renal failure   . Advance care planning   . Pulmonary hypertension, unspecified ([place])   . Chest pain   . Biventricular heart failure ([place]) 08/10/2017  . Syncope and collapse 08/10/2017  . Cardiac arrest ([place])   . Benign neoplasm of ascending colon   . Rectal bleeding   . ESRD (end stage renal disease) on dialysis ([place])   . Goals of care, counseling/discussion   . Palliative care by specialist   . Acute kidney injury ([place]) 06/25/2017  . Anasarca 05/30/2017  . HLD (hyperlipidemia) 10/05/2016  . Mouth bleeding 10/05/2016  . Bilateral knee pain 10/05/2016  . Petechial rash 10/05/2016  . Laceration of tongue   . Insomnia 06/10/2016  . Supratherapeutic INR 06/01/2016  . Acute hepatic encephalopathy 03/15/2016  .  Liver cirrhosis ([place]) 03/15/2016  . Volume overload 03/15/2016  . Acute congestive heart failure ([place])   . Insulin dependent diabetes mellitus ([place])   . Acute renal failure (ARF) ([place]) 03/14/2016  . GERD (gastroesophageal reflux disease) 03/04/2016  . Aortic regurgitation 02/27/2016  . Viral gastroenteritis 02/27/2016  . A-fib ([place]) 02/26/2016  . Abdominal pain 02/25/2016  . Diabetes mellitus with complication ([place]) 19/50/9326  . Permanent atrial fibrillation ([place]) 02/25/2016  . Congestive heart failure (CHF) ([place]) 02/25/2016  . Anemia, chronic disease  02/25/2016  . Atrial fibrillation with RVR ([place]) 02/19/2016  . Pain in joint, ankle and foot 01/10/2016  . Asthma   . Acute hypoxemic respiratory failure ([place]) 12/29/2015  . Acute respiratory failure ([place]) 12/29/2015  . Chronic atrial fibrillation ([place]) 12/24/2015  . Dilated cardiomyopathy ([place]) 12/24/2015  . Polypharmacy 11/23/2015  . Gout 10/23/2015  . Encounter for therapeutic drug monitoring 10/19/2015  . Anemia - mild exacerbation 10/15/2015  . Tricuspid valve disease 10/09/2015  . Mitral valve disease   . Calf pain 09/10/2015  . Type II diabetes mellitus with renal manifestations ([place]) 09/10/2015  . Essential hypertension 09/10/2015    Past Surgical History:  Procedure Laterality Date  . AV FISTULA PLACEMENT Left 07/13/2017   Procedure: LEFT ARM ARTERIOVENOUS (AV) FISTULA CREATION;  Surgeon: Waynetta Sandy, MD;  Location: [facility];  Service: Vascular;  Laterality: Left;  . BASCILIC VEIN TRANSPOSITION Left 11/03/2017   Procedure: BASILIC VEIN TRANSPOSITION SECOND STAGE;  Surgeon: Waynetta Sandy, MD;  Location: [facility];  Service: Vascular;  Laterality: Left;  . CARDIAC SURGERY    . CARDIAC VALVE REPLACEMENT  2011  . CARDIOVERSION N/A 07/09/2017   Procedure: CARDIOVERSION;  Surgeon: Larey Dresser, MD;  Location: [facility] ENDOSCOPY;  Service: Cardiovascular;  Laterality: N/A;  . COLONOSCOPY N/A 07/16/2017   Procedure: COLONOSCOPY;  Surgeon: Jerene Bears, MD;  Location: [facility];  Service: Gastroenterology;  Laterality: N/A;  . ESOPHAGOGASTRODUODENOSCOPY N/A 07/16/2017   Procedure: ESOPHAGOGASTRODUODENOSCOPY (EGD);  Surgeon: Jerene Bears, MD;  Location: [facility] ENDOSCOPY;  Service: Gastroenterology;  Laterality: N/A;  . INSERTION OF DIALYSIS CATHETER Right 07/13/2017   Procedure: INSERTION OF DIALYSIS CATHETER RIGHT INTERNAL JUGULAR;  Surgeon: Waynetta Sandy, MD;  Location: [facility];  Service: Vascular;  Laterality: Right;  . RIGHT HEART CATH N/A 06/29/2017    Procedure: RIGHT HEART CATH;  Surgeon: Larey Dresser, MD;  Location: [facility] CV LAB;  Service: Cardiovascular;  Laterality: N/A;  . RIGHT/LEFT HEART CATH AND CORONARY ANGIOGRAPHY N/A 08/19/2017   Procedure: RIGHT/LEFT HEART CATH AND CORONARY ANGIOGRAPHY;  Surgeon: Leonie Man, MD;  Location: [facility] CV LAB;  Service: Cardiovascular;  Laterality: N/A;  . TEE WITHOUT CARDIOVERSION N/A 07/09/2017   Procedure: TRANSESOPHAGEAL ECHOCARDIOGRAM (TEE);  Surgeon: Larey Dresser, MD;  Location: [facility] ENDOSCOPY;  Service: Cardiovascular;  Laterality: N/A;        Home Medications    Prior to Admission medications   Medication Sig Start Date End Date Taking? Authorizing Provider  acetaminophen (TYLENOL) 325 MG tablet Take 650 mg by mouth every 6 (six) hours.     [provider]  allopurinol (ZYLOPRIM) 300 MG tablet Take 0.5 tablets (150 mg total) by mouth daily. Patient taking differently: Take 150 mg by mouth at bedtime.  07/28/17   Shirley Friar, PA-C  atorvastatin (LIPITOR) 20 MG tablet TAKE 1 TABLET (20 MG TOTAL) BY MOUTH DAILY. PLEASE KEEP UPCOMING APPT. [place] YOU 06/07/18   Fay Records, MD  calcium acetate (PHOSLO) 667 MG capsule Take 1 capsule (667 mg total) by mouth 3 (three) times daily with meals. Please keep upcoming appt. Thank You 03/09/18   Fay Records, MD  colchicine 0.6 MG tablet Take 0.5 tablets (0.3 mg total) by mouth 2 (two) times a week. 09/21/17   Tawny Asal, MD  fluticasone (FLONASE) 50 MCG/ACT nasal spray Place 2 sprays into both nostrils daily. 12/14/17   Bonnielee Haff, MD  folic acid (FOLVITE) 1 MG tablet TAKE 1 TABLET BY MOUTH EVERY DAY 03/03/18   Charlott Rakes, MD  hydrOXYzine (ATARAX/VISTARIL) 25 MG tablet 1/2-1 tablets tid prn itching 12/24/17   Charlott Rakes, MD  metoprolol tartrate (LOPRESSOR) 25 MG tablet Take 0.5 tablets (12.5 mg total) by mouth 2 (two) times daily. Patient taking differently: Take 25 mg by mouth daily.  12/14/17    Bonnielee Haff, MD  midodrine (PROAMATINE) 5 MG tablet Take 2 tablets (10 mg total) by mouth 3 (three) times daily with meals. Patient taking differently: Take 5 mg by mouth 2 (two) times daily with a meal. Pt takes 10mg  by mouth three times per week-10mg  by mouth one hour prior to dialysis and 10mg  by mouth after the start of dialysis 09/06/17   Kalman Shan Ratliff, DO  multivitamin (RENA-VIT) TABS tablet Take 1 tablet by mouth at bedtime. 12/14/17   Bonnielee Haff, MD  omeprazole (PRILOSEC) 20 MG capsule Take 1 capsule (20 mg total) by mouth daily. 03/03/17   Charlott Rakes, MD  oxyCODONE (OXY IR/ROXICODONE) 5 MG immediate release tablet Take 5 mg by mouth every 8 (eight) hours as needed for pain.    [provider]  OXYGEN Inhale 2 L into the lungs daily as needed.     [provider]  tiZANidine (ZANAFLEX) 4 MG tablet Take 1 tablet (4 mg total) by mouth every 8 (eight) hours as needed for muscle spasms. 12/24/17   Charlott Rakes, MD  traMADol (ULTRAM) 50 MG tablet Take 1 tablet (50 mg total) by mouth every 12 (twelve) hours as needed. 06/09/18   Couture, Cortni S, PA-C  warfarin (COUMADIN) 2.5 MG tablet TAKE 1 TO 1.5 TABLETS DAILY AS DIRECTED BY COUMADIN CLINIC 05/28/18   Fay Records, MD    Family History Family History  Problem Relation Age of Onset  . Heart attack Neg Hx     Social History Social History   Tobacco Use  . Smoking status: Former Smoker    Packs/day: 1.00    Types: Cigarettes  . Smokeless tobacco: Never Used  Substance Use Topics  . Alcohol use: No  . Drug use: No     Allergies   Patient has no known allergies.   Review of Systems Review of Systems  Constitutional: Negative.  Negative for chills, fatigue and fever.  Eyes: Negative.  Negative for visual disturbance.  Respiratory: Negative.  Negative for shortness of breath.   Cardiovascular: Negative.  Negative for chest pain.  Musculoskeletal: Negative.  Negative for arthralgias and  myalgias.  Skin: Positive for rash.  Neurological: Negative.  Negative for dizziness, syncope, weakness, numbness and headaches.     Physical Exam Updated Vital Signs BP (!) 118/92 (BP Location: Right Arm)   Pulse 91   Temp 98.9 F (37.2 C) (Oral)   Resp 18   SpO2 100%   Physical Exam  Constitutional: He is oriented to person, place, and time. He appears well-developed and well-nourished. No distress.  HENT:  Head: Normocephalic and atraumatic.  Right Ear: External  ear normal.  Left Ear: External ear normal.  Nose: Nose normal.  Eyes: Pupils are equal, round, and reactive to light. EOM are normal.  Neck: Trachea normal and normal range of motion. No tracheal deviation present.  Pulmonary/Chest: Effort normal. No respiratory distress.  Abdominal: Soft. There is no tenderness. There is no rebound and no guarding.  Musculoskeletal: Normal range of motion.  Neurological: He is alert and oriented to person, place, and time.  Skin: Skin is warm and dry. Lesion noted.  See attached pictures.  Psychiatric: He has a normal mood and affect. His behavior is normal.           ED Treatments / Results  Labs (all labs ordered are listed, but only abnormal results are displayed) Labs Reviewed - No data to display  EKG None  Radiology No results found.  Procedures Procedures (including critical care time)  Medications Ordered in ED Medications - No data to display   Initial Impression / Assessment and Plan / ED Course  I have reviewed the triage vital signs and the nursing notes.  Pertinent labs & imaging results that were available during my care of the patient were reviewed by me and considered in my medical decision making (see chart for details).  Clinical Course as of Jun 19 110  Fri Jun 18, 2018  2048 Patient is ambulatory in department to the bathroom and back without difficulty, no acute distress.   [BM]    Clinical Course User Index [BM] Deliah Boston, PA-C   Patient presented with pruritic rash as pictured above. Patient denies any difficulty breathing or swallowing.  Pt has a patent airway without stridor and is handling secretions without difficulty; no angioedema.  No concern for superimposed infection at this time.  Patient was sent here by his dialysis center for worsening of his rash.  Patient denying all other symptoms.  Patient states that his rash is pruritic and he has been scratching.  Patient states that he has a follow-up with his dermatologist on Monday morning and he only came today because his dialysis center asked him to.  Patient denies chest pain, shortness of breath, abdominal pain, nausea/vomiting, bleeding, joint pain, vision changes or any other pain at this time.  Patient is afebrile, not tachycardic, no acute distress at this time.  Patient appears safe and reliable for dermatology follow-up on Monday.  Patient is ambulatory in department.  I have informed the patient that he should stop scratching at his rash to avoid infection.  At this time there does not appear to be any evidence of an acute emergency medical condition and the patient appears stable for discharge with appropriate outpatient follow up. Diagnosis was discussed with patient who verbalizes understanding of care plan and is agreeable to discharge. I have discussed return precautions with patient who verbalizes  understanding of return precautions. Patient strongly encouraged to follow-up with their PCP. All questions answered.  Patient's case discussed with Dr. Tyrone Nine who has seen the attached pictures and agrees with discharge at this time with dermatology follow-up.    Note: Portions of this report may have been transcribed using voice recognition software. Every effort was made to ensure accuracy; however, inadvertent computerized transcription errors may still be present.   Final Clinical Impressions(s) / ED Diagnoses   Final diagnoses:  Rash    Vasculitis [facility])    ED Discharge Orders    None       Deliah Boston, PA-C 06/19/18 0120  Deno Etienne, DO 06/19/18 [phone]

## 2018-06-18 NOTE — ED Notes (Signed)
Patient able to ambulate independently  

## 2018-06-19 DIAGNOSIS — Z992 Dependence on renal dialysis: Secondary | ICD-10-CM | POA: Diagnosis not present

## 2018-06-19 DIAGNOSIS — E876 Hypokalemia: Secondary | ICD-10-CM | POA: Diagnosis not present

## 2018-06-19 DIAGNOSIS — N2581 Secondary hyperparathyroidism of renal origin: Secondary | ICD-10-CM | POA: Diagnosis not present

## 2018-06-19 DIAGNOSIS — E119 Type 2 diabetes mellitus without complications: Secondary | ICD-10-CM | POA: Diagnosis not present

## 2018-06-19 DIAGNOSIS — N186 End stage renal disease: Secondary | ICD-10-CM | POA: Diagnosis not present

## 2018-06-19 DIAGNOSIS — D631 Anemia in chronic kidney disease: Secondary | ICD-10-CM | POA: Diagnosis not present

## 2018-06-21 ENCOUNTER — Other Ambulatory Visit: Payer: Self-pay | Admitting: *Deleted

## 2018-06-21 DIAGNOSIS — M310 Hypersensitivity angiitis: Secondary | ICD-10-CM | POA: Diagnosis not present

## 2018-06-21 DIAGNOSIS — Z992 Dependence on renal dialysis: Secondary | ICD-10-CM | POA: Diagnosis not present

## 2018-06-21 DIAGNOSIS — I482 Chronic atrial fibrillation: Secondary | ICD-10-CM | POA: Diagnosis not present

## 2018-06-21 DIAGNOSIS — N2581 Secondary hyperparathyroidism of renal origin: Secondary | ICD-10-CM | POA: Diagnosis not present

## 2018-06-21 DIAGNOSIS — D631 Anemia in chronic kidney disease: Secondary | ICD-10-CM | POA: Diagnosis not present

## 2018-06-21 DIAGNOSIS — E119 Type 2 diabetes mellitus without complications: Secondary | ICD-10-CM | POA: Diagnosis not present

## 2018-06-21 DIAGNOSIS — N186 End stage renal disease: Secondary | ICD-10-CM | POA: Diagnosis not present

## 2018-06-21 DIAGNOSIS — L309 Dermatitis, unspecified: Secondary | ICD-10-CM | POA: Diagnosis not present

## 2018-06-21 DIAGNOSIS — E876 Hypokalemia: Secondary | ICD-10-CM | POA: Diagnosis not present

## 2018-06-21 MED ORDER — AMIODARONE HCL 200 MG PO TABS: 200.0000 mg | ORAL_TABLET | Freq: Two times a day (BID) | ORAL | 1 refills | Status: DC

## 2018-06-23 DIAGNOSIS — Z992 Dependence on renal dialysis: Secondary | ICD-10-CM | POA: Diagnosis not present

## 2018-06-23 DIAGNOSIS — E119 Type 2 diabetes mellitus without complications: Secondary | ICD-10-CM | POA: Diagnosis not present

## 2018-06-23 DIAGNOSIS — D631 Anemia in chronic kidney disease: Secondary | ICD-10-CM | POA: Diagnosis not present

## 2018-06-23 DIAGNOSIS — N186 End stage renal disease: Secondary | ICD-10-CM | POA: Diagnosis not present

## 2018-06-23 DIAGNOSIS — E876 Hypokalemia: Secondary | ICD-10-CM | POA: Diagnosis not present

## 2018-06-23 DIAGNOSIS — N2581 Secondary hyperparathyroidism of renal origin: Secondary | ICD-10-CM | POA: Diagnosis not present

## 2018-06-24 ENCOUNTER — Ambulatory Visit: Payer: Medicare Other | Admitting: Family Medicine

## 2018-06-25 DIAGNOSIS — D631 Anemia in chronic kidney disease: Secondary | ICD-10-CM | POA: Diagnosis not present

## 2018-06-25 DIAGNOSIS — N186 End stage renal disease: Secondary | ICD-10-CM | POA: Diagnosis not present

## 2018-06-25 DIAGNOSIS — N2581 Secondary hyperparathyroidism of renal origin: Secondary | ICD-10-CM | POA: Diagnosis not present

## 2018-06-25 DIAGNOSIS — E119 Type 2 diabetes mellitus without complications: Secondary | ICD-10-CM | POA: Diagnosis not present

## 2018-06-25 DIAGNOSIS — Z992 Dependence on renal dialysis: Secondary | ICD-10-CM | POA: Diagnosis not present

## 2018-06-25 DIAGNOSIS — E876 Hypokalemia: Secondary | ICD-10-CM | POA: Diagnosis not present

## 2018-06-26 DIAGNOSIS — Z992 Dependence on renal dialysis: Secondary | ICD-10-CM | POA: Diagnosis not present

## 2018-06-26 DIAGNOSIS — E119 Type 2 diabetes mellitus without complications: Secondary | ICD-10-CM | POA: Diagnosis not present

## 2018-06-26 DIAGNOSIS — N2581 Secondary hyperparathyroidism of renal origin: Secondary | ICD-10-CM | POA: Diagnosis not present

## 2018-06-26 DIAGNOSIS — E876 Hypokalemia: Secondary | ICD-10-CM | POA: Diagnosis not present

## 2018-06-26 DIAGNOSIS — D631 Anemia in chronic kidney disease: Secondary | ICD-10-CM | POA: Diagnosis not present

## 2018-06-26 DIAGNOSIS — N186 End stage renal disease: Secondary | ICD-10-CM | POA: Diagnosis not present

## 2018-06-28 DIAGNOSIS — E119 Type 2 diabetes mellitus without complications: Secondary | ICD-10-CM | POA: Diagnosis not present

## 2018-06-28 DIAGNOSIS — E876 Hypokalemia: Secondary | ICD-10-CM | POA: Diagnosis not present

## 2018-06-28 DIAGNOSIS — Z992 Dependence on renal dialysis: Secondary | ICD-10-CM | POA: Diagnosis not present

## 2018-06-28 DIAGNOSIS — N2581 Secondary hyperparathyroidism of renal origin: Secondary | ICD-10-CM | POA: Diagnosis not present

## 2018-06-28 DIAGNOSIS — I482 Chronic atrial fibrillation: Secondary | ICD-10-CM | POA: Diagnosis not present

## 2018-06-28 DIAGNOSIS — N186 End stage renal disease: Secondary | ICD-10-CM | POA: Diagnosis not present

## 2018-06-28 DIAGNOSIS — D631 Anemia in chronic kidney disease: Secondary | ICD-10-CM | POA: Diagnosis not present

## 2018-06-29 ENCOUNTER — Ambulatory Visit (HOSPITAL_COMMUNITY): Payer: Medicare Other | Attending: Vascular Surgery

## 2018-06-29 ENCOUNTER — Ambulatory Visit: Payer: Medicare Other | Admitting: Vascular Surgery

## 2018-06-29 ENCOUNTER — Encounter: Payer: Self-pay | Admitting: Vascular Surgery

## 2018-06-30 DIAGNOSIS — E119 Type 2 diabetes mellitus without complications: Secondary | ICD-10-CM | POA: Diagnosis not present

## 2018-06-30 DIAGNOSIS — Z992 Dependence on renal dialysis: Secondary | ICD-10-CM | POA: Diagnosis not present

## 2018-06-30 DIAGNOSIS — N2581 Secondary hyperparathyroidism of renal origin: Secondary | ICD-10-CM | POA: Diagnosis not present

## 2018-06-30 DIAGNOSIS — D631 Anemia in chronic kidney disease: Secondary | ICD-10-CM | POA: Diagnosis not present

## 2018-06-30 DIAGNOSIS — N186 End stage renal disease: Secondary | ICD-10-CM | POA: Diagnosis not present

## 2018-06-30 DIAGNOSIS — E876 Hypokalemia: Secondary | ICD-10-CM | POA: Diagnosis not present

## 2018-07-01 ENCOUNTER — Ambulatory Visit (HOSPITAL_COMMUNITY): Admission: RE | Admit: 2018-07-01 | Payer: Medicare Other | Source: Ambulatory Visit

## 2018-07-02 DIAGNOSIS — N2581 Secondary hyperparathyroidism of renal origin: Secondary | ICD-10-CM | POA: Diagnosis not present

## 2018-07-02 DIAGNOSIS — E876 Hypokalemia: Secondary | ICD-10-CM | POA: Diagnosis not present

## 2018-07-02 DIAGNOSIS — N186 End stage renal disease: Secondary | ICD-10-CM | POA: Diagnosis not present

## 2018-07-02 DIAGNOSIS — E119 Type 2 diabetes mellitus without complications: Secondary | ICD-10-CM | POA: Diagnosis not present

## 2018-07-02 DIAGNOSIS — D631 Anemia in chronic kidney disease: Secondary | ICD-10-CM | POA: Diagnosis not present

## 2018-07-02 DIAGNOSIS — Z992 Dependence on renal dialysis: Secondary | ICD-10-CM | POA: Diagnosis not present

## 2018-07-03 DIAGNOSIS — D631 Anemia in chronic kidney disease: Secondary | ICD-10-CM | POA: Diagnosis not present

## 2018-07-03 DIAGNOSIS — Z992 Dependence on renal dialysis: Secondary | ICD-10-CM | POA: Diagnosis not present

## 2018-07-03 DIAGNOSIS — N2581 Secondary hyperparathyroidism of renal origin: Secondary | ICD-10-CM | POA: Diagnosis not present

## 2018-07-03 DIAGNOSIS — E119 Type 2 diabetes mellitus without complications: Secondary | ICD-10-CM | POA: Diagnosis not present

## 2018-07-03 DIAGNOSIS — E876 Hypokalemia: Secondary | ICD-10-CM | POA: Diagnosis not present

## 2018-07-03 DIAGNOSIS — N186 End stage renal disease: Secondary | ICD-10-CM | POA: Diagnosis not present

## 2018-07-05 DIAGNOSIS — D631 Anemia in chronic kidney disease: Secondary | ICD-10-CM | POA: Diagnosis not present

## 2018-07-05 DIAGNOSIS — E119 Type 2 diabetes mellitus without complications: Secondary | ICD-10-CM | POA: Diagnosis not present

## 2018-07-05 DIAGNOSIS — I482 Chronic atrial fibrillation: Secondary | ICD-10-CM | POA: Diagnosis not present

## 2018-07-05 DIAGNOSIS — Z992 Dependence on renal dialysis: Secondary | ICD-10-CM | POA: Diagnosis not present

## 2018-07-05 DIAGNOSIS — N2581 Secondary hyperparathyroidism of renal origin: Secondary | ICD-10-CM | POA: Diagnosis not present

## 2018-07-05 DIAGNOSIS — E876 Hypokalemia: Secondary | ICD-10-CM | POA: Diagnosis not present

## 2018-07-05 DIAGNOSIS — N186 End stage renal disease: Secondary | ICD-10-CM | POA: Diagnosis not present

## 2018-07-06 ENCOUNTER — Ambulatory Visit (INDEPENDENT_AMBULATORY_CARE_PROVIDER_SITE_OTHER): Payer: Medicare Other | Admitting: Vascular Surgery

## 2018-07-06 ENCOUNTER — Encounter: Payer: Self-pay | Admitting: Vascular Surgery

## 2018-07-06 ENCOUNTER — Telehealth: Payer: Self-pay | Admitting: Oncology

## 2018-07-06 ENCOUNTER — Other Ambulatory Visit: Payer: Self-pay

## 2018-07-06 ENCOUNTER — Telehealth: Payer: Self-pay | Admitting: Family Medicine

## 2018-07-06 VITALS — BP 102/58 | HR 109 | Temp 99.3°F | Resp 16 | Ht 65.0 in | Wt 152.0 lb

## 2018-07-06 DIAGNOSIS — N186 End stage renal disease: Secondary | ICD-10-CM | POA: Diagnosis not present

## 2018-07-06 DIAGNOSIS — Z992 Dependence on renal dialysis: Secondary | ICD-10-CM | POA: Diagnosis not present

## 2018-07-06 DIAGNOSIS — I2510 Atherosclerotic heart disease of native coronary artery without angina pectoris: Secondary | ICD-10-CM | POA: Diagnosis not present

## 2018-07-06 NOTE — Telephone Encounter (Signed)
He will need to get in touch with the Coumadin clinic who manages his INR.

## 2018-07-06 NOTE — Progress Notes (Signed)
Vascular and Vein Specialist of [facility]  Patient name: Michael [name] MRN: [id number] DOB: 11-26-1960 Sex: male  REASON FOR VISIT: Evaluation of potential lower extremity arterial insufficiency  HPI: Michael [name] is a 57 y.o. male today for evaluation of lower extremity arterial flow.  He is known to our practice from prior access creation for hemodialysis access.  He has a interpreter here with him throughout the evaluation.  Very unusual new onset of extensive rash that is raised and red with superficial ulcerations from his knees distally onto his feet bilaterally and also on his forearms extending out onto his hands.  We have been requested to assure that he has adequate arterial flow due to his skin changes.  He has no claudication symptoms.  Past Medical History:  Diagnosis Date  . Anemia of chronic disease   . Asthma    said he does not know  . Chronic kidney disease (CKD), stage IV (severe) ([place])   . Chronic systolic CHF (congestive heart failure) (HCC)    a. prior EF normal; b. Echo 10/16: Mild LVH, EF 30-35%, mitral valve bioprosthesis present without evidence of stenosis or regurgitation, severe LAE, mild RVE with mild to moderately reduced RVSF, moderate RAE  . Cirrhosis ([place])   . Coronary artery disease   . Diabetes mellitus type 2 in obese ([place])   . Diabetes mellitus without complication ([place])   . Dyspnea   . GERD (gastroesophageal reflux disease)   . H/O tricuspid valve repair 2012  . History of echocardiogram    Echo at [facility] in [facility] 4/12: mod LVH, EF 60-65%, mod LAE, tissue MVR ok, mod TR, mod pulmo HTN with RVSP 48 mmHg  . Hypertension   . Obstructive sleep apnea   . Paroxysmal atrial fibrillation (HCC)    s/p Maze procedure at time of MVR in 2011  . Pulmonary HTN ([place])   . 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 Thrombocytopenia ([place])     Family History  Problem Relation Age of Onset  .  Heart attack Neg Hx     SOCIAL HISTORY: Social History   Tobacco Use  . Smoking status: Former Smoker    Packs/day: 1.00    Types: Cigarettes  . Smokeless tobacco: Never Used  Substance Use Topics  . Alcohol use: No    No Known Allergies  Current Outpatient Medications  Medication Sig Dispense Refill  . acetaminophen (TYLENOL) 325 MG tablet Take 650 mg by mouth every 6 (six) hours.     Marland Kitchen allopurinol (ZYLOPRIM) 300 MG tablet Take 0.5 tablets (150 mg total) by mouth daily. (Patient taking differently: Take 150 mg by mouth at bedtime. ) 15 tablet 3  . amiodarone (PACERONE) 200 MG tablet Take 1 tablet (200 mg total) by mouth 2 (two) times daily. 60 tablet 1  . atorvastatin (LIPITOR) 20 MG tablet TAKE 1 TABLET (20 MG TOTAL) BY MOUTH DAILY. PLEASE KEEP UPCOMING APPT. THANK YOU 90 tablet 1  . calcium acetate (PHOSLO) 667 MG capsule Take 1 capsule (667 mg total) by mouth 3 (three) times daily with meals. Please keep upcoming appt. Thank You 90 capsule 0  . colchicine 0.6 MG tablet Take 0.5 tablets (0.3 mg total) by mouth 2 (two) times a week. 30 tablet 0  . fluticasone (FLONASE) 50 MCG/ACT nasal spray Place 2 sprays into both nostrils daily. 16 g 0  . folic acid (FOLVITE) 1 MG tablet TAKE 1 TABLET BY MOUTH EVERY DAY 90 tablet 0  .  hydrOXYzine (ATARAX/VISTARIL) 25 MG tablet 1/2-1 tablets tid prn itching 30 tablet 1  . metoprolol tartrate (LOPRESSOR) 25 MG tablet Take 0.5 tablets (12.5 mg total) by mouth 2 (two) times daily. (Patient taking differently: Take 25 mg by mouth daily. ) 60 tablet 0  . midodrine (PROAMATINE) 5 MG tablet Take 2 tablets (10 mg total) by mouth 3 (three) times daily with meals. (Patient taking differently: Take 5 mg by mouth 2 (two) times daily with a meal. Pt takes 10mg  by mouth three times per week-10mg  by mouth one hour prior to dialysis and 10mg  by mouth after the start of dialysis) 90 tablet 2  . multivitamin (RENA-VIT) TABS tablet Take 1 tablet by mouth at bedtime.  30 tablet 0  . omeprazole (PRILOSEC) 20 MG capsule Take 1 capsule (20 mg total) by mouth daily. 30 capsule 5  . oxyCODONE (OXY IR/ROXICODONE) 5 MG immediate release tablet Take 5 mg by mouth every 8 (eight) hours as needed for pain.    . OXYGEN Inhale 2 L into the lungs daily as needed.     Marland Kitchen tiZANidine (ZANAFLEX) 4 MG tablet Take 1 tablet (4 mg total) by mouth every 8 (eight) hours as needed for muscle spasms. 60 tablet 1  . traMADol (ULTRAM) 50 MG tablet Take 1 tablet (50 mg total) by mouth every 12 (twelve) hours as needed. 10 tablet 0  . warfarin (COUMADIN) 2.5 MG tablet TAKE 1 TO 1.5 TABLETS DAILY AS DIRECTED BY COUMADIN CLINIC 50 tablet 0   No current facility-administered medications for this visit.     REVIEW OF SYSTEMS:  [X]  denotes positive finding, [ ]  denotes negative finding Cardiac  Comments:  Chest pain or chest pressure:    Shortness of breath upon exertion: x   Short of breath when lying flat:    Irregular heart rhythm:        Vascular    Pain in calf, thigh, or hip brought on by ambulation:    Pain in feet at night that wakes you up from your sleep:     Blood clot in your veins:    Leg swelling:           PHYSICAL EXAM: Vitals:   07/06/18 1427  BP: (!) 102/58  Pulse: (!) 109  Resp: 16  Temp: 99.3 F (37.4 C)  TempSrc: Oral  SpO2: 97%  Weight: 152 lb (68.9 kg)  Height: 5\' 5"  (1.651 m)    GENERAL: The patient is a well-nourished male, in no acute distress. The vital signs are documented above. CARDIOVASCULAR: Patent left upper arm AV fistula.  Plus dorsalis pedis pulses bilaterally PULMONARY: There is good air exchange  MUSCULOSKELETAL: There are no major deformities or cyanosis. NEUROLOGIC: No focal weakness or paresthesias are detected. SKIN: Sensitive rashes with superficial ulceration over both legs from his knees distally and also from his forearms onto his hands PSYCHIATRIC: The patient has a normal affect.  DATA:  None  MEDICAL  ISSUES: Normal arterial flow to his lower extremities.  No explanation from a vascular standpoint regarding these rashes.  He has seen the dermatology and has had biopsies of this from his right leg.  Reassured him with finding that this is not arterial and he will see [place] again on an as-needed basis    Rosetta Posner, MD [facility] Vascular and Vein Specialists of [facility] Tel [phone] Pager [phone]

## 2018-07-06 NOTE — Telephone Encounter (Signed)
New referral received from Dr. Marval Regal for thrombocytopenia. I received a call from Lelon Frohlich, referral coordinator from [facility], for the pt to see a hematologist. Per Lelon Frohlich pt can only come in on Tuesdays or Thursdays due to dialysis. Pt has been scheduled to see Dr. Alen Blew on 9/3 at 2pm. Lelon Frohlich will notify the pt.

## 2018-07-06 NOTE — Telephone Encounter (Signed)
Pt called to request a medication refill on -warfarin (COUMADIN) 2.5 MG tablet  To -[id number] - [place], [place] - [address] Please follow up

## 2018-07-07 DIAGNOSIS — E876 Hypokalemia: Secondary | ICD-10-CM | POA: Diagnosis not present

## 2018-07-07 DIAGNOSIS — Z992 Dependence on renal dialysis: Secondary | ICD-10-CM | POA: Diagnosis not present

## 2018-07-07 DIAGNOSIS — N2581 Secondary hyperparathyroidism of renal origin: Secondary | ICD-10-CM | POA: Diagnosis not present

## 2018-07-07 DIAGNOSIS — E119 Type 2 diabetes mellitus without complications: Secondary | ICD-10-CM | POA: Diagnosis not present

## 2018-07-07 DIAGNOSIS — N186 End stage renal disease: Secondary | ICD-10-CM | POA: Diagnosis not present

## 2018-07-07 DIAGNOSIS — D631 Anemia in chronic kidney disease: Secondary | ICD-10-CM | POA: Diagnosis not present

## 2018-07-07 NOTE — Telephone Encounter (Signed)
This patient needs an OV for [facility].

## 2018-07-08 NOTE — Telephone Encounter (Signed)
Called patient no answer.

## 2018-07-09 DIAGNOSIS — E876 Hypokalemia: Secondary | ICD-10-CM | POA: Diagnosis not present

## 2018-07-09 DIAGNOSIS — N186 End stage renal disease: Secondary | ICD-10-CM | POA: Diagnosis not present

## 2018-07-09 DIAGNOSIS — D631 Anemia in chronic kidney disease: Secondary | ICD-10-CM | POA: Diagnosis not present

## 2018-07-09 DIAGNOSIS — N2581 Secondary hyperparathyroidism of renal origin: Secondary | ICD-10-CM | POA: Diagnosis not present

## 2018-07-09 DIAGNOSIS — E119 Type 2 diabetes mellitus without complications: Secondary | ICD-10-CM | POA: Diagnosis not present

## 2018-07-09 DIAGNOSIS — Z992 Dependence on renal dialysis: Secondary | ICD-10-CM | POA: Diagnosis not present

## 2018-07-10 ENCOUNTER — Telehealth: Payer: Self-pay | Admitting: Physician Assistant

## 2018-07-10 ENCOUNTER — Other Ambulatory Visit: Payer: Self-pay | Admitting: Physician Assistant

## 2018-07-10 DIAGNOSIS — Z992 Dependence on renal dialysis: Secondary | ICD-10-CM | POA: Diagnosis not present

## 2018-07-10 DIAGNOSIS — E119 Type 2 diabetes mellitus without complications: Secondary | ICD-10-CM | POA: Diagnosis not present

## 2018-07-10 DIAGNOSIS — N2581 Secondary hyperparathyroidism of renal origin: Secondary | ICD-10-CM | POA: Diagnosis not present

## 2018-07-10 DIAGNOSIS — E876 Hypokalemia: Secondary | ICD-10-CM | POA: Diagnosis not present

## 2018-07-10 DIAGNOSIS — N186 End stage renal disease: Secondary | ICD-10-CM | POA: Diagnosis not present

## 2018-07-10 DIAGNOSIS — D631 Anemia in chronic kidney disease: Secondary | ICD-10-CM | POA: Diagnosis not present

## 2018-07-10 MED ORDER — WARFARIN SODIUM 2.5 MG PO TABS: 2.5000 mg | ORAL_TABLET | Freq: Every day | ORAL | 0 refills | Status: DC

## 2018-07-10 NOTE — Telephone Encounter (Signed)
Patient called regarding his Coumadin.  He was hard to understand, but we were eventually able to communicate.  He stated he was out of Coumadin and needed a refill.  I advised that he had not had a Coumadin check in almost a month and I did not see an appointment.  I advised that I will call in 1 month supply of the Coumadin, and route this to the Coumadin clinic.  If he does not get a Coumadin check, we will not refill the Coumadin again.  Advised him strongly to keep his appointment coming up on September 5, but he probably needs a Coumadin check before then.   Rosaria Ferries, PA-C 07/10/2018 3:10 PM [place] [phone]

## 2018-07-12 DIAGNOSIS — N2581 Secondary hyperparathyroidism of renal origin: Secondary | ICD-10-CM | POA: Diagnosis not present

## 2018-07-12 DIAGNOSIS — E119 Type 2 diabetes mellitus without complications: Secondary | ICD-10-CM | POA: Diagnosis not present

## 2018-07-12 DIAGNOSIS — I482 Chronic atrial fibrillation: Secondary | ICD-10-CM | POA: Diagnosis not present

## 2018-07-12 DIAGNOSIS — N186 End stage renal disease: Secondary | ICD-10-CM | POA: Diagnosis not present

## 2018-07-12 DIAGNOSIS — E876 Hypokalemia: Secondary | ICD-10-CM | POA: Diagnosis not present

## 2018-07-12 DIAGNOSIS — D631 Anemia in chronic kidney disease: Secondary | ICD-10-CM | POA: Diagnosis not present

## 2018-07-12 DIAGNOSIS — Z992 Dependence on renal dialysis: Secondary | ICD-10-CM | POA: Diagnosis not present

## 2018-07-13 ENCOUNTER — Inpatient Hospital Stay: Payer: Medicare Other | Admitting: Family Medicine

## 2018-07-14 ENCOUNTER — Other Ambulatory Visit: Payer: Self-pay | Admitting: Internal Medicine

## 2018-07-14 DIAGNOSIS — D631 Anemia in chronic kidney disease: Secondary | ICD-10-CM | POA: Diagnosis not present

## 2018-07-14 DIAGNOSIS — N186 End stage renal disease: Secondary | ICD-10-CM | POA: Diagnosis not present

## 2018-07-14 DIAGNOSIS — E876 Hypokalemia: Secondary | ICD-10-CM | POA: Diagnosis not present

## 2018-07-14 DIAGNOSIS — N2581 Secondary hyperparathyroidism of renal origin: Secondary | ICD-10-CM | POA: Diagnosis not present

## 2018-07-14 DIAGNOSIS — Z992 Dependence on renal dialysis: Secondary | ICD-10-CM | POA: Diagnosis not present

## 2018-07-14 DIAGNOSIS — E119 Type 2 diabetes mellitus without complications: Secondary | ICD-10-CM | POA: Diagnosis not present

## 2018-07-16 DIAGNOSIS — N2581 Secondary hyperparathyroidism of renal origin: Secondary | ICD-10-CM | POA: Diagnosis not present

## 2018-07-16 DIAGNOSIS — N186 End stage renal disease: Secondary | ICD-10-CM | POA: Diagnosis not present

## 2018-07-16 DIAGNOSIS — E876 Hypokalemia: Secondary | ICD-10-CM | POA: Diagnosis not present

## 2018-07-16 DIAGNOSIS — E119 Type 2 diabetes mellitus without complications: Secondary | ICD-10-CM | POA: Diagnosis not present

## 2018-07-16 DIAGNOSIS — D631 Anemia in chronic kidney disease: Secondary | ICD-10-CM | POA: Diagnosis not present

## 2018-07-16 DIAGNOSIS — Z992 Dependence on renal dialysis: Secondary | ICD-10-CM | POA: Diagnosis not present

## 2018-07-17 DIAGNOSIS — N2581 Secondary hyperparathyroidism of renal origin: Secondary | ICD-10-CM | POA: Diagnosis not present

## 2018-07-17 DIAGNOSIS — N186 End stage renal disease: Secondary | ICD-10-CM | POA: Diagnosis not present

## 2018-07-17 DIAGNOSIS — Z992 Dependence on renal dialysis: Secondary | ICD-10-CM | POA: Diagnosis not present

## 2018-07-17 DIAGNOSIS — E876 Hypokalemia: Secondary | ICD-10-CM | POA: Diagnosis not present

## 2018-07-17 DIAGNOSIS — D631 Anemia in chronic kidney disease: Secondary | ICD-10-CM | POA: Diagnosis not present

## 2018-07-17 DIAGNOSIS — E119 Type 2 diabetes mellitus without complications: Secondary | ICD-10-CM | POA: Diagnosis not present

## 2018-07-18 DIAGNOSIS — N186 End stage renal disease: Secondary | ICD-10-CM | POA: Diagnosis not present

## 2018-07-18 DIAGNOSIS — I509 Heart failure, unspecified: Secondary | ICD-10-CM | POA: Diagnosis not present

## 2018-07-18 DIAGNOSIS — Z992 Dependence on renal dialysis: Secondary | ICD-10-CM | POA: Diagnosis not present

## 2018-07-19 DIAGNOSIS — D631 Anemia in chronic kidney disease: Secondary | ICD-10-CM | POA: Diagnosis not present

## 2018-07-19 DIAGNOSIS — I132 Hypertensive heart and chronic kidney disease with heart failure and with stage 5 chronic kidney disease, or end stage renal disease: Secondary | ICD-10-CM | POA: Diagnosis not present

## 2018-07-19 DIAGNOSIS — E119 Type 2 diabetes mellitus without complications: Secondary | ICD-10-CM | POA: Diagnosis not present

## 2018-07-19 DIAGNOSIS — N2581 Secondary hyperparathyroidism of renal origin: Secondary | ICD-10-CM | POA: Diagnosis not present

## 2018-07-19 DIAGNOSIS — Z992 Dependence on renal dialysis: Secondary | ICD-10-CM | POA: Diagnosis not present

## 2018-07-19 DIAGNOSIS — E876 Hypokalemia: Secondary | ICD-10-CM | POA: Diagnosis not present

## 2018-07-19 DIAGNOSIS — I5022 Chronic systolic (congestive) heart failure: Secondary | ICD-10-CM | POA: Diagnosis not present

## 2018-07-19 DIAGNOSIS — I482 Chronic atrial fibrillation: Secondary | ICD-10-CM | POA: Diagnosis not present

## 2018-07-19 DIAGNOSIS — Z23 Encounter for immunization: Secondary | ICD-10-CM | POA: Diagnosis not present

## 2018-07-19 DIAGNOSIS — N186 End stage renal disease: Secondary | ICD-10-CM | POA: Diagnosis not present

## 2018-07-19 DIAGNOSIS — I50810 Right heart failure, unspecified: Secondary | ICD-10-CM | POA: Diagnosis not present

## 2018-07-20 ENCOUNTER — Inpatient Hospital Stay: Payer: Medicare Other | Attending: Oncology | Admitting: Oncology

## 2018-07-20 ENCOUNTER — Telehealth: Payer: Self-pay | Admitting: Oncology

## 2018-07-20 VITALS — BP 122/99 | HR 121 | Temp 97.9°F | Resp 19 | Ht 65.0 in | Wt 151.1 lb

## 2018-07-20 DIAGNOSIS — D696 Thrombocytopenia, unspecified: Secondary | ICD-10-CM

## 2018-07-20 DIAGNOSIS — R21 Rash and other nonspecific skin eruption: Secondary | ICD-10-CM | POA: Diagnosis not present

## 2018-07-20 NOTE — Telephone Encounter (Signed)
Scheduled appt per 9/3 los -gave patient AVS and calender per los.  

## 2018-07-20 NOTE — Progress Notes (Signed)
Reason for the request: Thrombocytopenia  HPI: I was asked by Dr. Marval Regal  to evaluate [name] for thrombocytopenia.  He is a 57 year old gentleman with multiple comorbid conditions including coronary disease, cardiomyopathy and chronic renal insufficiency.  He is dialysis dependent and unclear history of cirrhosis of the liver.  He has been noted to have fluctuating thrombocytopenia in the last year or so.  His platelet count was as low as 57 back in October 2018 and rebound to normal range subsequently.  His most recent test CBC in July 2019 showed a white cell count of 4.5, hemoglobin of 10.4 and a platelet count of 78.  In June 2019 his platelet count was 105 and in January 2019 his blood count was 172.  His platelet count in January 2019 was 67.  He has developed diffuse rash on his upper and lower extremities and was evaluated by dermatology and prescribed topical creams.  He denies any hematochezia, melena and does report occasional epistaxis.  He ambulates with the help of a cane without any falls or syncope.  He does not report any headaches, blurry vision, syncope or seizures. Does not report any fevers, chills or sweats.  Does not report any cough, wheezing or hemoptysis.  Does not report any chest pain, palpitation, orthopnea or leg edema.  Does not report any nausea, vomiting or abdominal pain.  Does not report any constipation or diarrhea.  Does not report any skeletal complaints.    Does not report frequency, urgency or hematuria.  . Does not report any heat or cold intolerance.  Does not report any lymphadenopathy or petechiae.  Does not report any anxiety or depression.  Remaining review of systems is negative.    Past Medical History:  Diagnosis Date  . Anemia of chronic disease   . Asthma    said he does not know  . Chronic kidney disease (CKD), stage IV (severe) ([place])   . Chronic systolic CHF (congestive heart failure) (HCC)    a. prior EF normal; b. Echo 10/16: Mild  LVH, EF 30-35%, mitral valve bioprosthesis present without evidence of stenosis or regurgitation, severe LAE, mild RVE with mild to moderately reduced RVSF, moderate RAE  . Cirrhosis ([place])   . Coronary artery disease   . Diabetes mellitus type 2 in obese ([place])   . Diabetes mellitus without complication ([place])   . Dyspnea   . GERD (gastroesophageal reflux disease)   . H/O tricuspid valve repair 2012  . History of echocardiogram    Echo at [facility] in [facility] 4/12: mod LVH, EF 60-65%, mod LAE, tissue MVR ok, mod TR, mod pulmo HTN with RVSP 48 mmHg  . Hypertension   . Obstructive sleep apnea   . Paroxysmal atrial fibrillation (HCC)    s/p Maze procedure at time of MVR in 2011  . Pulmonary HTN ([place])   . 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 Thrombocytopenia ([place])   :  Past Surgical History:  Procedure Laterality Date  . AV FISTULA PLACEMENT Left 07/13/2017   Procedure: LEFT ARM ARTERIOVENOUS (AV) FISTULA CREATION;  Surgeon: Waynetta Sandy, MD;  Location: [place];  Service: Vascular;  Laterality: Left;  . BASCILIC VEIN TRANSPOSITION Left 11/03/2017   Procedure: BASILIC VEIN TRANSPOSITION SECOND STAGE;  Surgeon: Waynetta Sandy, MD;  Location: [place];  Service: Vascular;  Laterality: Left;  . CARDIAC SURGERY    . CARDIAC VALVE REPLACEMENT  2011  . CARDIOVERSION N/A 07/09/2017   Procedure: CARDIOVERSION;  Surgeon: Larey Dresser, MD;  Location: [facility] ENDOSCOPY;  Service: Cardiovascular;  Laterality: N/A;  . COLONOSCOPY N/A 07/16/2017   Procedure: COLONOSCOPY;  Surgeon: Jerene Bears, MD;  Location: [place];  Service: Gastroenterology;  Laterality: N/A;  . ESOPHAGOGASTRODUODENOSCOPY N/A 07/16/2017   Procedure: ESOPHAGOGASTRODUODENOSCOPY (EGD);  Surgeon: Jerene Bears, MD;  Location: [facility] ENDOSCOPY;  Service: Gastroenterology;  Laterality: N/A;  . INSERTION OF DIALYSIS CATHETER Right 07/13/2017   Procedure: INSERTION OF DIALYSIS CATHETER RIGHT INTERNAL JUGULAR;   Surgeon: Waynetta Sandy, MD;  Location: [place];  Service: Vascular;  Laterality: Right;  . RIGHT HEART CATH N/A 06/29/2017   Procedure: RIGHT HEART CATH;  Surgeon: Larey Dresser, MD;  Location: [place] CV LAB;  Service: Cardiovascular;  Laterality: N/A;  . RIGHT/LEFT HEART CATH AND CORONARY ANGIOGRAPHY N/A 08/19/2017   Procedure: RIGHT/LEFT HEART CATH AND CORONARY ANGIOGRAPHY;  Surgeon: Leonie Man, MD;  Location: [place] CV LAB;  Service: Cardiovascular;  Laterality: N/A;  . TEE WITHOUT CARDIOVERSION N/A 07/09/2017   Procedure: TRANSESOPHAGEAL ECHOCARDIOGRAM (TEE);  Surgeon: Larey Dresser, MD;  Location: [facility] ENDOSCOPY;  Service: Cardiovascular;  Laterality: N/A;  :   Current Outpatient Medications:  .  acetaminophen (TYLENOL) 325 MG tablet, Take 650 mg by mouth every 6 (six) hours. , Disp: , Rfl:  .  allopurinol (ZYLOPRIM) 300 MG tablet, Take 0.5 tablets (150 mg total) by mouth daily. (Patient taking differently: Take 150 mg by mouth at bedtime. ), Disp: 15 tablet, Rfl: 3 .  amiodarone (PACERONE) 200 MG tablet, TAKE 1 TABLET BY MOUTH TWICE A DAY, Disp: 60 tablet, Rfl: 10 .  atorvastatin (LIPITOR) 20 MG tablet, TAKE 1 TABLET (20 MG TOTAL) BY MOUTH DAILY. PLEASE KEEP UPCOMING APPT. THANK YOU, Disp: 90 tablet, Rfl: 1 .  calcium acetate (PHOSLO) 667 MG capsule, Take 1 capsule (667 mg total) by mouth 3 (three) times daily with meals. Please keep upcoming appt. Thank You, Disp: 90 capsule, Rfl: 0 .  colchicine 0.6 MG tablet, Take 0.5 tablets (0.3 mg total) by mouth 2 (two) times a week., Disp: 30 tablet, Rfl: 0 .  fluticasone (FLONASE) 50 MCG/ACT nasal spray, Place 2 sprays into both nostrils daily., Disp: 16 g, Rfl: 0 .  folic acid (FOLVITE) 1 MG tablet, TAKE 1 TABLET BY MOUTH EVERY DAY, Disp: 90 tablet, Rfl: 0 .  hydrOXYzine (ATARAX/VISTARIL) 25 MG tablet, 1/2-1 tablets tid prn itching, Disp: 30 tablet, Rfl: 1 .  metoprolol tartrate (LOPRESSOR) 25 MG tablet, Take 0.5 tablets  (12.5 mg total) by mouth 2 (two) times daily. (Patient taking differently: Take 25 mg by mouth daily. ), Disp: 60 tablet, Rfl: 0 .  midodrine (PROAMATINE) 5 MG tablet, Take 2 tablets (10 mg total) by mouth 3 (three) times daily with meals. (Patient taking differently: Take 5 mg by mouth 2 (two) times daily with a meal. Pt takes 10mg  by mouth three times per week-10mg  by mouth one hour prior to dialysis and 10mg  by mouth after the start of dialysis), Disp: 90 tablet, Rfl: 2 .  multivitamin (RENA-VIT) TABS tablet, Take 1 tablet by mouth at bedtime., Disp: 30 tablet, Rfl: 0 .  omeprazole (PRILOSEC) 20 MG capsule, Take 1 capsule (20 mg total) by mouth daily., Disp: 30 capsule, Rfl: 5 .  oxyCODONE (OXY IR/ROXICODONE) 5 MG immediate release tablet, Take 5 mg by mouth every 8 (eight) hours as needed for pain., Disp: , Rfl:  .  OXYGEN, Inhale 2 L into the lungs daily as needed. ,  Disp: , Rfl:  .  tiZANidine (ZANAFLEX) 4 MG tablet, Take 1 tablet (4 mg total) by mouth every 8 (eight) hours as needed for muscle spasms., Disp: 60 tablet, Rfl: 1 .  traMADol (ULTRAM) 50 MG tablet, Take 1 tablet (50 mg total) by mouth every 12 (twelve) hours as needed., Disp: 10 tablet, Rfl: 0 .  warfarin (COUMADIN) 2.5 MG tablet, Take 1 tablet (2.5 mg total) by mouth daily at 6 PM. 1 and 1/2 tabs daily or as directed., Disp: 50 tablet, Rfl: 0:  No Known Allergies:  Family History  Problem Relation Age of Onset  . Heart attack Neg Hx   :  Social History   Socioeconomic History  . Marital status: Single    Spouse name: Not on file  . Number of children: Not on file  . Years of education: Not on file  . Highest education level: Not on file  Occupational History    Comment: on disability  Social Needs  . Financial resource strain: Not on file  . Food insecurity:    Worry: Not on file    Inability: Not on file  . Transportation needs:    Medical: Not on file    Non-medical: Not on file  Tobacco Use  . Smoking  status: Former Smoker    Packs/day: 1.00    Types: Cigarettes  . Smokeless tobacco: Never Used  Substance and Sexual Activity  . Alcohol use: No  . Drug use: No  . Sexual activity: Not Currently  Lifestyle  . Physical activity:    Days per week: Not on file    Minutes per session: Not on file  . Stress: Not on file  Relationships  . Social connections:    Talks on phone: Not on file    Gets together: Not on file    Attends religious service: Not on file    Active member of club or organization: Not on file    Attends meetings of clubs or organizations: Not on file    Relationship status: Not on file  . Intimate partner violence:    Fear of current or ex partner: Not on file    Emotionally abused: Not on file    Physically abused: Not on file    Forced sexual activity: Not on file  Other Topics Concern  . Not on file  Social History Narrative  . Not on file  :  Pertinent items are noted in HPI.  Exam: Blood pressure (!) 122/99, pulse (!) 121, temperature 97.9 F (36.6 C), temperature source Oral, resp. rate 19, height 5\' 5"  (1.651 m), weight 151 lb 1.6 oz (68.5 kg), SpO2 100 %.   ECOG 1  General appearance: alert and cooperative appeared without distress. Head: atraumatic without any abnormalities. Eyes: conjunctivae/corneas clear. PERRL.  Sclera anicteric. Throat: lips, mucosa, and tongue normal; without oral thrush or ulcers. Resp: clear to auscultation bilaterally without rhonchi, wheezes or dullness to percussion. Cardio: regular rate and rhythm, S1, S2 normal, no murmur, click, rub or gallop GI: soft, non-tender; bowel sounds normal; no masses,  no organomegaly Skin: Diffuse flat erythematous rash noted on his upper and lower extremities.  No purpura or petechiae. Lymph nodes: Cervical, supraclavicular, and axillary nodes normal. Neurologic: Grossly normal without any motor, sensory or deep tendon reflexes. Musculoskeletal: No joint deformity or  effusion.  CBC    Component Value Date/Time   WBC 4.5 06/09/2018 1734   RBC 3.77 (L) 06/09/2018 1734   HGB 10.4 (L)  06/09/2018 1734   HGB 10.8 (L) 05/11/2018 1300   HCT 33.1 (L) 06/09/2018 1734   HCT 32.7 (L) 05/11/2018 1300   PLT 78 (L) 06/09/2018 1734   PLT 105 (L) 05/11/2018 1300   MCV 87.8 06/09/2018 1734   MCV 81 05/11/2018 1300   MCH 27.6 06/09/2018 1734   MCHC 31.4 06/09/2018 1734   RDW 16.9 (H) 06/09/2018 1734   RDW 16.5 (H) 05/11/2018 1300   LYMPHSABS 1.0 06/09/2018 1734   MONOABS 0.7 06/09/2018 1734   EOSABS 0.1 06/09/2018 1734   BASOSABS 0.0 06/09/2018 1734     Chemistry      Component Value Date/Time   NA 139 06/09/2018 1734   NA 142 05/11/2018 1300   K 3.5 06/09/2018 1734   CL 98 06/09/2018 1734   CO2 29 06/09/2018 1734   BUN 20 06/09/2018 1734   BUN 35 (H) 05/11/2018 1300   CREATININE 3.18 (H) 06/09/2018 1734   CREATININE 1.61 (H) 01/16/2017 1653      Component Value Date/Time   CALCIUM 8.7 (L) 06/09/2018 1734   ALKPHOS 143 (H) 06/09/2018 1734   AST 22 06/09/2018 1734   ALT 15 06/09/2018 1734   BILITOT 1.7 (H) 06/09/2018 1734        Assessment and Plan:   57 year old man with the following:  1.  Thrombocytopenia: His platelet count has fluctuated as high as normal range as low as 50,000 in the last year.  In July 2019 his platelet count was 28th.  He had normal white cell count and normal differential with mild anemia.  The differential diagnosis was discussed today with the patient which include reactive thrombocytopenia related to acute illness, splenic sequestration related to a questionable cirrhosis of the liver, immune thrombocytopenia among other etiologies.  Primary hematological conditions are considered less likely.  Myelodysplastic syndrome, HUS, TTP, DIC or HIT are considered less likely.  From a management standpoint, I have recommended continued observation and surveillance at this time.  His risk of bleeding is rather low given his  platelet count has been more than adequate from last year.  He does not require any growth factor support, transfusion or intervention at this time.   2.  Diffuse rash: Unclear etiology and I do not think this is related to his thrombocytopenia findings.  This rash has been ongoing for the last 5 weeks while his platelets have fluctuated over the last year and a half.  3.  Follow-up: We will be in 4 months to follow his progress.  30  minutes was spent with the patient face-to-face today.  More than 50% of time was dedicated to reviewing the natural course of his laboratory findings, educating him about the potential diagnoses and coordinating his future plan of care.      Thank you for the referral.  A copy of this consult has been forwarded to the requesting physician.

## 2018-07-21 DIAGNOSIS — E119 Type 2 diabetes mellitus without complications: Secondary | ICD-10-CM | POA: Diagnosis not present

## 2018-07-21 DIAGNOSIS — Z992 Dependence on renal dialysis: Secondary | ICD-10-CM | POA: Diagnosis not present

## 2018-07-21 DIAGNOSIS — N186 End stage renal disease: Secondary | ICD-10-CM | POA: Diagnosis not present

## 2018-07-21 DIAGNOSIS — E876 Hypokalemia: Secondary | ICD-10-CM | POA: Diagnosis not present

## 2018-07-21 DIAGNOSIS — N2581 Secondary hyperparathyroidism of renal origin: Secondary | ICD-10-CM | POA: Diagnosis not present

## 2018-07-21 DIAGNOSIS — D631 Anemia in chronic kidney disease: Secondary | ICD-10-CM | POA: Diagnosis not present

## 2018-07-22 ENCOUNTER — Encounter: Payer: Self-pay | Admitting: Cardiology

## 2018-07-22 ENCOUNTER — Ambulatory Visit (INDEPENDENT_AMBULATORY_CARE_PROVIDER_SITE_OTHER): Payer: Medicare Other | Admitting: *Deleted

## 2018-07-22 ENCOUNTER — Ambulatory Visit (INDEPENDENT_AMBULATORY_CARE_PROVIDER_SITE_OTHER): Payer: Medicare Other | Admitting: Cardiology

## 2018-07-22 VITALS — BP 118/78 | HR 119 | Ht 65.0 in | Wt 152.0 lb

## 2018-07-22 DIAGNOSIS — I2510 Atherosclerotic heart disease of native coronary artery without angina pectoris: Secondary | ICD-10-CM

## 2018-07-22 DIAGNOSIS — Z5181 Encounter for therapeutic drug level monitoring: Secondary | ICD-10-CM

## 2018-07-22 DIAGNOSIS — I059 Rheumatic mitral valve disease, unspecified: Secondary | ICD-10-CM

## 2018-07-22 DIAGNOSIS — I10 Essential (primary) hypertension: Secondary | ICD-10-CM | POA: Diagnosis not present

## 2018-07-22 DIAGNOSIS — I482 Chronic atrial fibrillation, unspecified: Secondary | ICD-10-CM

## 2018-07-22 DIAGNOSIS — I079 Rheumatic tricuspid valve disease, unspecified: Secondary | ICD-10-CM | POA: Diagnosis not present

## 2018-07-22 DIAGNOSIS — I5042 Chronic combined systolic (congestive) and diastolic (congestive) heart failure: Secondary | ICD-10-CM

## 2018-07-22 LAB — POCT INR: INR: 1.6 — AB (ref 2.0–3.0)

## 2018-07-22 MED ORDER — METOPROLOL TARTRATE 25 MG PO TABS: 25.0000 mg | ORAL_TABLET | Freq: Two times a day (BID) | ORAL | 3 refills | Status: DC
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
## 2018-07-22 NOTE — Patient Instructions (Addendum)
Medication Instructions: INCREASE: Metoprolol to 25 mg twice a day  Continue taking amiodarone 200 mg twice a day  Labwork: none  Procedures/Testing: none  Follow-Up: Your physician recommends that you schedule a follow-up appointment in: 2 weeks with a APP   Any Additional Special Instructions Will Be Listed Below (If Applicable).     If you need a refill on your cardiac medications before your next appointment, please call your pharmacy.

## 2018-07-22 NOTE — Patient Instructions (Signed)
Description   Today take 2 tablets then continue on same dosage 1.5 tablets everyday.  Recheck INR 2 weeks.  [facility] [phone] Pt's new number is [phone]

## 2018-07-23 DIAGNOSIS — Z992 Dependence on renal dialysis: Secondary | ICD-10-CM | POA: Diagnosis not present

## 2018-07-23 DIAGNOSIS — N186 End stage renal disease: Secondary | ICD-10-CM | POA: Diagnosis not present

## 2018-07-23 DIAGNOSIS — E119 Type 2 diabetes mellitus without complications: Secondary | ICD-10-CM | POA: Diagnosis not present

## 2018-07-23 DIAGNOSIS — D631 Anemia in chronic kidney disease: Secondary | ICD-10-CM | POA: Diagnosis not present

## 2018-07-23 DIAGNOSIS — N2581 Secondary hyperparathyroidism of renal origin: Secondary | ICD-10-CM | POA: Diagnosis not present

## 2018-07-23 DIAGNOSIS — E876 Hypokalemia: Secondary | ICD-10-CM | POA: Diagnosis not present

## 2018-07-24 DIAGNOSIS — N186 End stage renal disease: Secondary | ICD-10-CM | POA: Diagnosis not present

## 2018-07-24 DIAGNOSIS — N2581 Secondary hyperparathyroidism of renal origin: Secondary | ICD-10-CM | POA: Diagnosis not present

## 2018-07-24 DIAGNOSIS — E876 Hypokalemia: Secondary | ICD-10-CM | POA: Diagnosis not present

## 2018-07-24 DIAGNOSIS — D631 Anemia in chronic kidney disease: Secondary | ICD-10-CM | POA: Diagnosis not present

## 2018-07-24 DIAGNOSIS — Z992 Dependence on renal dialysis: Secondary | ICD-10-CM | POA: Diagnosis not present

## 2018-07-24 DIAGNOSIS — E119 Type 2 diabetes mellitus without complications: Secondary | ICD-10-CM | POA: Diagnosis not present

## 2018-07-26 DIAGNOSIS — N2581 Secondary hyperparathyroidism of renal origin: Secondary | ICD-10-CM | POA: Diagnosis not present

## 2018-07-26 DIAGNOSIS — N186 End stage renal disease: Secondary | ICD-10-CM | POA: Diagnosis not present

## 2018-07-26 DIAGNOSIS — E119 Type 2 diabetes mellitus without complications: Secondary | ICD-10-CM | POA: Diagnosis not present

## 2018-07-26 DIAGNOSIS — Z992 Dependence on renal dialysis: Secondary | ICD-10-CM | POA: Diagnosis not present

## 2018-07-26 DIAGNOSIS — I482 Chronic atrial fibrillation: Secondary | ICD-10-CM | POA: Diagnosis not present

## 2018-07-26 DIAGNOSIS — D631 Anemia in chronic kidney disease: Secondary | ICD-10-CM | POA: Diagnosis not present

## 2018-07-26 DIAGNOSIS — E876 Hypokalemia: Secondary | ICD-10-CM | POA: Diagnosis not present

## 2018-07-27 ENCOUNTER — Encounter

## 2018-07-28 DIAGNOSIS — N186 End stage renal disease: Secondary | ICD-10-CM | POA: Diagnosis not present

## 2018-07-28 DIAGNOSIS — N2581 Secondary hyperparathyroidism of renal origin: Secondary | ICD-10-CM | POA: Diagnosis not present

## 2018-07-28 DIAGNOSIS — D631 Anemia in chronic kidney disease: Secondary | ICD-10-CM | POA: Diagnosis not present

## 2018-07-28 DIAGNOSIS — Z992 Dependence on renal dialysis: Secondary | ICD-10-CM | POA: Diagnosis not present

## 2018-07-28 DIAGNOSIS — E119 Type 2 diabetes mellitus without complications: Secondary | ICD-10-CM | POA: Diagnosis not present

## 2018-07-28 DIAGNOSIS — E876 Hypokalemia: Secondary | ICD-10-CM | POA: Diagnosis not present

## 2018-07-30 DIAGNOSIS — N2581 Secondary hyperparathyroidism of renal origin: Secondary | ICD-10-CM | POA: Diagnosis not present

## 2018-07-30 DIAGNOSIS — E119 Type 2 diabetes mellitus without complications: Secondary | ICD-10-CM | POA: Diagnosis not present

## 2018-07-30 DIAGNOSIS — N186 End stage renal disease: Secondary | ICD-10-CM | POA: Diagnosis not present

## 2018-07-30 DIAGNOSIS — D631 Anemia in chronic kidney disease: Secondary | ICD-10-CM | POA: Diagnosis not present

## 2018-07-30 DIAGNOSIS — E876 Hypokalemia: Secondary | ICD-10-CM | POA: Diagnosis not present

## 2018-07-30 DIAGNOSIS — Z992 Dependence on renal dialysis: Secondary | ICD-10-CM | POA: Diagnosis not present

## 2018-07-31 DIAGNOSIS — N186 End stage renal disease: Secondary | ICD-10-CM | POA: Diagnosis not present

## 2018-07-31 DIAGNOSIS — E119 Type 2 diabetes mellitus without complications: Secondary | ICD-10-CM | POA: Diagnosis not present

## 2018-07-31 DIAGNOSIS — N2581 Secondary hyperparathyroidism of renal origin: Secondary | ICD-10-CM | POA: Diagnosis not present

## 2018-07-31 DIAGNOSIS — Z992 Dependence on renal dialysis: Secondary | ICD-10-CM | POA: Diagnosis not present

## 2018-07-31 DIAGNOSIS — D631 Anemia in chronic kidney disease: Secondary | ICD-10-CM | POA: Diagnosis not present

## 2018-07-31 DIAGNOSIS — E876 Hypokalemia: Secondary | ICD-10-CM | POA: Diagnosis not present

## 2018-08-02 DIAGNOSIS — E119 Type 2 diabetes mellitus without complications: Secondary | ICD-10-CM | POA: Diagnosis not present

## 2018-08-02 DIAGNOSIS — E876 Hypokalemia: Secondary | ICD-10-CM | POA: Diagnosis not present

## 2018-08-02 DIAGNOSIS — D631 Anemia in chronic kidney disease: Secondary | ICD-10-CM | POA: Diagnosis not present

## 2018-08-02 DIAGNOSIS — N2581 Secondary hyperparathyroidism of renal origin: Secondary | ICD-10-CM | POA: Diagnosis not present

## 2018-08-02 DIAGNOSIS — Z992 Dependence on renal dialysis: Secondary | ICD-10-CM | POA: Diagnosis not present

## 2018-08-02 DIAGNOSIS — N186 End stage renal disease: Secondary | ICD-10-CM | POA: Diagnosis not present

## 2018-08-02 DIAGNOSIS — I482 Chronic atrial fibrillation: Secondary | ICD-10-CM | POA: Diagnosis not present

## 2018-08-04 ENCOUNTER — Other Ambulatory Visit: Payer: Self-pay | Admitting: Physician Assistant

## 2018-08-04 DIAGNOSIS — D631 Anemia in chronic kidney disease: Secondary | ICD-10-CM | POA: Diagnosis not present

## 2018-08-04 DIAGNOSIS — Z992 Dependence on renal dialysis: Secondary | ICD-10-CM | POA: Diagnosis not present

## 2018-08-04 DIAGNOSIS — E876 Hypokalemia: Secondary | ICD-10-CM | POA: Diagnosis not present

## 2018-08-04 DIAGNOSIS — E119 Type 2 diabetes mellitus without complications: Secondary | ICD-10-CM | POA: Diagnosis not present

## 2018-08-04 DIAGNOSIS — N2581 Secondary hyperparathyroidism of renal origin: Secondary | ICD-10-CM | POA: Diagnosis not present

## 2018-08-04 DIAGNOSIS — N186 End stage renal disease: Secondary | ICD-10-CM | POA: Diagnosis not present

## 2018-08-04 NOTE — Progress Notes (Signed)
Cardiology Office Note   Date:  08/07/2018   ID:  Khalel, Alms [birth year], MRN [id number]  PCP:  Charlott Rakes, MD  Cardiologist:  Dr. Harrington Challenger    Chief Complaint  Patient presents with  . Tachycardia    a fib      History of Present Illness: Michael [name] is a 57 y.o. male who presents for atrial fib.   He has past medical history significant for CKD stage IV, chronic systolic CHF, hypertension, anemia, cirrhosis, CAD s/p CABG, diabetes type 2, GERD, mitral valve replacement and tricuspid valve repair, hypertension, hyperlipidemia, obstructive sleep apnea, PAF on Coumadin and thrombocytopenia.  He also has a history of poor adherence with medications.  He apparently has a rash for which has no source identified so far.   Last echo in Jan 2019: LVEF 50 to 55% MV with mod MS of bioprosthesis. No regurg LAE. RV dilated RA dilated Severe TR.  Seen in our office on 05/11/2018 by Dr. Harrington Challenger at which time he was doing well except for tachycardia..  A Holter monitor was placed in July that showed atrial fibrillation with an average rate of 122 bpm.  Amiodarone 200 mg twice daily was started  On last visit meds adjusted continuedamiodarone 200 BID and metoprolol increased to 25 BID  Today his interpreter is with him.  HR improved at 117 when he does any activity the HR is fast.  No chest pain some SOB with increase of HR.    He is still unclear on his meds.  He has correct meds with him.    Past Medical History:  Diagnosis Date  . Anemia of chronic disease   . Asthma    said he does not know  . Chronic kidney disease (CKD), stage IV (severe) ([place])   . Chronic systolic CHF (congestive heart failure) (HCC)    a. prior EF normal; b. Echo 10/16: Mild LVH, EF 30-35%, mitral valve bioprosthesis present without evidence of stenosis or regurgitation, severe LAE, mild RVE with mild to moderately reduced RVSF, moderate RAE  . Cirrhosis ([place])   . Coronary artery  disease   . Diabetes mellitus type 2 in obese ([place])   . Diabetes mellitus without complication ([place])   . Dyspnea   . GERD (gastroesophageal reflux disease)   . H/O tricuspid valve repair 2012  . History of echocardiogram    Echo [facility] 4/12: mod LVH, EF 60-65%, mod LAE, tissue MVR ok, mod TR, mod pulmo HTN with RVSP 48 mmHg  . Hypertension   . Obstructive sleep apnea   . Paroxysmal atrial fibrillation (HCC)    s/p Maze procedure at time of MVR in 2011  . Pulmonary HTN ([place])   . 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 Thrombocytopenia ([place])     Past Surgical History:  Procedure Laterality Date  . AV FISTULA PLACEMENT Left 07/13/2017   Procedure: LEFT ARM ARTERIOVENOUS (AV) FISTULA CREATION;  Surgeon: Waynetta Sandy, MD;  Location: [facility];  Service: Vascular;  Laterality: Left;  . BASCILIC VEIN TRANSPOSITION Left 11/03/2017   Procedure: BASILIC VEIN TRANSPOSITION SECOND STAGE;  Surgeon: Waynetta Sandy, MD;  Location: [facility];  Service: Vascular;  Laterality: Left;  . CARDIAC SURGERY    . CARDIAC VALVE REPLACEMENT  2011  . CARDIOVERSION N/A 07/09/2017   Procedure: CARDIOVERSION;  Surgeon: Larey Dresser, MD;  Location[facility] ENDOSCOPY;  Service: Cardiovascular;  Laterality: N/A;  . COLONOSCOPY N/A  07/16/2017   Procedure: COLONOSCOPY;  Surgeon: Jerene Bears, MD;  Location: [facility];  Service: Gastroenterology;  Laterality: N/A;  . ESOPHAGOGASTRODUODENOSCOPY N/A 07/16/2017   Procedure: ESOPHAGOGASTRODUODENOSCOPY (EGD);  Surgeon: Jerene Bears, MD;  Location: [facility] ENDOSCOPY;  Service: Gastroenterology;  Laterality: N/A;  . INSERTION OF DIALYSIS CATHETER Right 07/13/2017   Procedure: INSERTION OF DIALYSIS CATHETER RIGHT INTERNAL JUGULAR;  Surgeon: Waynetta Sandy, MD;  Location: [facility];  Service: Vascular;  Laterality: Right;  . RIGHT HEART CATH N/A 06/29/2017   Procedure: RIGHT HEART CATH;  Surgeon: Larey Dresser, MD;  Location: [facility] CV LAB;  Service: Cardiovascular;  Laterality: N/A;  . RIGHT/LEFT HEART CATH AND CORONARY ANGIOGRAPHY N/A 08/19/2017   Procedure: RIGHT/LEFT HEART CATH AND CORONARY ANGIOGRAPHY;  Surgeon: Leonie Man, MD;  Location: [facility] CV LAB;  Service: Cardiovascular;  Laterality: N/A;  . TEE WITHOUT CARDIOVERSION N/A 07/09/2017   Procedure: TRANSESOPHAGEAL ECHOCARDIOGRAM (TEE);  Surgeon: Larey Dresser, MD;  Location: [facility] ENDOSCOPY;  Service: Cardiovascular;  Laterality: N/A;     No current facility-administered medications for this visit.    No current outpatient medications on file.   Facility-Administered Medications Ordered in Other Visits  Medication Dose Route Frequency Provider Last Rate Last Dose  . 0.9 %  sodium chloride infusion   Intra-arterial PRN Omar Person, NP 10 mL/hr at 08/07/18 1000    . atorvastatin (LIPITOR) tablet 20 mg  20 mg Oral Daily Omar Person, NP   20 mg at 08/07/18 1135  . ceFEPIme (MAXIPIME) 2 g in sodium chloride 0.9 % 100 mL IVPB  2 g Intravenous Q12H Carney, Gay Filler, RPH   Stopped at 08/07/18 1727  . chlorhexidine gluconate (MEDLINE KIT) (PERIDEX) 0.12 % solution 15 mL  15 mL Mouth Rinse BID Omar Person, NP   15 mL at 08/07/18 2003  . famotidine (PEPCID) tablet 20 mg  20 mg Per Tube Daily Omar Person, NP   20 mg at 08/07/18 1131  . feeding supplement (PRO-STAT SUGAR FREE 64) liquid 30 mL  30 mL Per Tube BID Kipp Brood, MD   30 mL at 08/07/18 1218  . feeding supplement (VITAL AF 1.2 CAL) liquid 1,000 mL  1,000 mL Per Tube Continuous Jesus Genera, MD 50 mL/hr at 08/07/18 1813 1,000 mL at 08/07/18 1813  . fentaNYL (SUBLIMAZE) injection 100 mcg  100 mcg Intravenous Q15 min PRN Omar Person, NP      . fentaNYL (SUBLIMAZE) injection 100 mcg  100 mcg Intravenous Q2H PRN Omar Person, NP   100 mcg at 08/07/18 1652  . heparin injection 1,000-6,000 Units  1,000-6,000 Units CRRT PRN Donato Heinz, MD       . heparinized saline (2000 units/L) primer fluid for CRRT   CRRT PRN Donato Heinz, MD 999 mL/hr at 08/06/18 2303    . hydrocortisone sodium succinate (SOLU-CORTEF) 100 MG injection 50 mg  50 mg Intravenous Q6H Omar Person, NP   50 mg at 08/07/18 1651  . insulin aspart (novoLOG) injection 0-15 Units  0-15 Units Subcutaneous Q4H Omar Person, NP   2 Units at 08/07/18 2009  . ipratropium-albuterol (DUONEB) 0.5-2.5 (3) MG/3ML nebulizer solution 3 mL  3 mL Nebulization Q6H PRN Jennelle Human B, NP      . MEDLINE mouth rinse  15 mL Mouth Rinse 10 times per day Omar Person, NP   15 mL at 08/07/18 2131  . midazolam (VERSED) injection 2 mg  2 mg Intravenous Q15 min PRN Omar Person, NP      . midazolam (VERSED) injection 2 mg  2 mg Intravenous Q2H PRN Omar Person, NP   2 mg at 08/07/18 0444  . midodrine (PROAMATINE) tablet 5 mg  5 mg Per Tube TID WC Omar Person, NP   5 mg at 08/07/18 1659  . norepinephrine (LEVOPHED) 16 mg in D5W 258m premix infusion  0-40 mcg/min Intravenous Titrated EOmar Person NP 9.38 mL/hr at 08/07/18 2100 10 mcg/min at 08/07/18 2100  . prismasol BGK 0/2.5 5,000 mL dialysis replacement fluid   CRRT Continuous SRoney Jaffe MD 500 mL/hr at 08/07/18 1158    . prismasol BGK 0/2.5 5,000 mL dialysis solution   CRRT Continuous SRoney Jaffe MD 1,000 mL/hr at 08/07/18 2130    . prismasol BGK 4/2.5 5,000 mL dialysis replacement fluid   CRRT Continuous MJesus Genera MD 300 mL/hr at 08/07/18 0945    . vancomycin (VANCOCIN) IVPB 750 mg/150 ml premix  750 mg Intravenous Q24H CPat Patrick RPH   Stopped at 08/07/18 1913    Allergies:   Patient has no known allergies.    Social History:  The patient  reports that he has quit smoking. His smoking use included cigarettes. He smoked 1.00 pack per day. He has never used smokeless tobacco. He reports that he does not drink alcohol or use drugs.   Family History:  The  patient's family history is not on file. pt not sure   ROS:  General:no colds or fevers, no weight changes Skin:no rashes or ulcers HEENT:no blurred vision, no congestion CV:see HPI PUL:see HPI GI:no diarrhea constipation or melena, no indigestion GU:no hematuria, no dysuria MS:no joint pain, no claudication Neuro:no syncope, no lightheadedness Endo:no diabetes, no thyroid disease  Wt Readings from Last 3 Encounters:  08/07/18 176 lb 9.4 oz (80.1 kg)  08/05/18 155 lb 4 oz (70.4 kg)  07/22/18 152 lb (68.9 kg)     PHYSICAL EXAM: VS:  BP 108/82   Pulse (!) 117   Ht _0  (1.651 m)   Wt 155 lb 4 oz (70.4 kg)   SpO2 97%   BMI 25.83 kg/m  , BMI Body mass index is 25.83 kg/m. General:Pleasant affect, NAD Skin:Warm and dry, brisk capillary refill- petechial rash on arms and legs.  Has been seen by derm. HEENT:normocephalic, sclera clear, mucus membranes moist Neck:supple, no JVD, no bruits  Heart:irreg irreg without murmur, gallup, rub or click Lungs:clear without rales, rhonchi, or wheezes ATMA:UQJF non tender, + BS, do not palpate liver spleen or masses Ext:no lower ext edema, 2+ pedal pulses, 2+ radial pulses Neuro:alert and oriented X 3, MAE, follows commands, + facial symmetry    EKG:  EKG is ordered today. The ekg ordered today demonstrates A fib rvr at 117 no acute changes  Recent Labs: 11/23/2017: B Natriuretic Peptide 977.3 08/05/2018: TSH 2.600 08/06/2018: ALT 16 08/07/2018: BUN 50; Creatinine, Ser 4.38; Hemoglobin 10.7; Magnesium 2.6; Platelets 167; Potassium 5.1; Sodium 138    Lipid Panel    Component Value Date/Time   CHOL 85 11/24/2017 0521   CHOL 111 03/03/2017 1152   TRIG 76 11/24/2017 0521   HDL 28 (L) 11/24/2017 0521   HDL 36 (L) 03/03/2017 1152   CHOLHDL 3.0 11/24/2017 0521   VLDL 15 11/24/2017 0521   LDLCALC 42 11/24/2017 0521   LDLCALC 62 03/03/2017 1152       Other studies Reviewed: Additional studies/ records that were  reviewed today  include:  TTE 11/25/17. Study Conclusions  - Left ventricle: The cavity size was normal. Wall thickness was   normal. Systolic function was normal. The estimated ejection   fraction was in the range of 50% to 55%. - Aortic valve: There was trivial regurgitation. - Mitral valve: Leaflets calcified and thickened with mean gradient   and MVA continueity suggesting moderate MS. Bioprosthetic MV with   no peri valavar regurgitation. Valve area by continuity equation   (using LVOT flow): 1.03 cm^2. - Left atrium: The atrium was severely dilated. - Right ventricle: The cavity size was severely dilated. - Right atrium: The atrium was severely dilated. - Atrial septum: No defect or patent foramen ovale was identified. - Tricuspid valve: There was severe regurgitation. - Pulmonary arteries: PA peak pressure: 53 mm Hg (S).  ASSESSMENT AND PLAN:  1.  Persistent a fib, rate slightly improved.  Discussed with Dr. Harrington Challenger, will check amiodarone level to eval if he is taking - unable to increase lopressor due to hypotension - on midodrine for hypotension.  Will check BMET and TSH as well.  followup in 2 weeks with Dr Harrington Challenger or APP  2.  ESRD on HD   3.  Rash, has been evaluated by Derm.  Idiopathic.  4.  Chronic systolic HF - stable with dialysis  5.  CAD with last cath 2018 RPDA lesion, 100 %stenosed - unable to see the occlusion site, but collaterals filling the distal vessel are seen. Not a PCI option & likely not acute. Otherwise minimal CAD   6.  S/p MV replacement and TV repair.  2011.  See echo above.     Current medicines are reviewed with the patient today.  The patient Has no concerns regarding medicines.  The following changes have been made:  See above Labs/ tests ordered today include:see above  Disposition:   FU:  see above  Signed, [place]ie Kicks, NP  08/07/2018 9:41 PM    [facility], [place], [place], [place] Phone: [phone]; Fax: [phone]

## 2018-08-05 ENCOUNTER — Ambulatory Visit (INDEPENDENT_AMBULATORY_CARE_PROVIDER_SITE_OTHER): Payer: Medicare Other | Admitting: Cardiology

## 2018-08-05 ENCOUNTER — Ambulatory Visit (INDEPENDENT_AMBULATORY_CARE_PROVIDER_SITE_OTHER): Payer: Medicare Other | Admitting: *Deleted

## 2018-08-05 ENCOUNTER — Encounter: Payer: Self-pay | Admitting: Cardiology

## 2018-08-05 VITALS — BP 108/82 | HR 117 | Ht 65.0 in | Wt 155.2 lb

## 2018-08-05 DIAGNOSIS — I10 Essential (primary) hypertension: Secondary | ICD-10-CM

## 2018-08-05 DIAGNOSIS — I4891 Unspecified atrial fibrillation: Secondary | ICD-10-CM | POA: Diagnosis not present

## 2018-08-05 DIAGNOSIS — I950 Idiopathic hypotension: Secondary | ICD-10-CM

## 2018-08-05 DIAGNOSIS — I059 Rheumatic mitral valve disease, unspecified: Secondary | ICD-10-CM | POA: Diagnosis not present

## 2018-08-05 DIAGNOSIS — I079 Rheumatic tricuspid valve disease, unspecified: Secondary | ICD-10-CM | POA: Diagnosis not present

## 2018-08-05 DIAGNOSIS — Z5181 Encounter for therapeutic drug level monitoring: Secondary | ICD-10-CM | POA: Diagnosis not present

## 2018-08-05 DIAGNOSIS — I2510 Atherosclerotic heart disease of native coronary artery without angina pectoris: Secondary | ICD-10-CM

## 2018-08-05 LAB — POCT INR: INR: 3.9 — AB (ref 2.0–3.0)

## 2018-08-05 NOTE — Patient Instructions (Addendum)
Medication Instructions:    Labwork: TODAY:  BMET, TSH, & AMIODARONE LEVEL  Testing/Procedures: None ordered  Follow-Up: Your physician recommends that you schedule a follow-up appointment in: 2 [place] APP ON HIS CARE TEAM   Any Other Special Instructions Will Be Listed Below (If Applicable).     If you need a refill on your cardiac medications before your next appointment, please call your pharmacy.

## 2018-08-05 NOTE — Patient Instructions (Signed)
Description   Skip today's dose,  then continue on same dosage 1.5 tablets everyday.  Recheck INR 2 weeks.  [facility] [phone] Pt's new number is [phone]

## 2018-08-06 ENCOUNTER — Inpatient Hospital Stay (HOSPITAL_COMMUNITY)
Admission: EM | Admit: 2018-08-06 | Discharge: 2018-08-12 | DRG: 871 | Disposition: A | Discharge status: Home Health | Payer: Medicare Other | Source: Other Acute Inpatient Hospital | Attending: Family Medicine | Admitting: Family Medicine

## 2018-08-06 ENCOUNTER — Inpatient Hospital Stay (HOSPITAL_COMMUNITY): Payer: Medicare Other

## 2018-08-06 ENCOUNTER — Emergency Department (HOSPITAL_COMMUNITY): Payer: Medicare Other

## 2018-08-06 ENCOUNTER — Encounter (HOSPITAL_COMMUNITY): Payer: Self-pay | Admitting: *Deleted

## 2018-08-06 DIAGNOSIS — I132 Hypertensive heart and chronic kidney disease with heart failure and with stage 5 chronic kidney disease, or end stage renal disease: Secondary | ICD-10-CM | POA: Diagnosis present

## 2018-08-06 DIAGNOSIS — I5042 Chronic combined systolic (congestive) and diastolic (congestive) heart failure: Secondary | ICD-10-CM | POA: Diagnosis present

## 2018-08-06 DIAGNOSIS — G9341 Metabolic encephalopathy: Secondary | ICD-10-CM | POA: Diagnosis present

## 2018-08-06 DIAGNOSIS — R4182 Altered mental status, unspecified: Secondary | ICD-10-CM

## 2018-08-06 DIAGNOSIS — E119 Type 2 diabetes mellitus without complications: Secondary | ICD-10-CM | POA: Diagnosis not present

## 2018-08-06 DIAGNOSIS — G9340 Encephalopathy, unspecified: Secondary | ICD-10-CM | POA: Diagnosis present

## 2018-08-06 DIAGNOSIS — E1122 Type 2 diabetes mellitus with diabetic chronic kidney disease: Secondary | ICD-10-CM | POA: Diagnosis present

## 2018-08-06 DIAGNOSIS — I959 Hypotension, unspecified: Secondary | ICD-10-CM | POA: Diagnosis not present

## 2018-08-06 DIAGNOSIS — R680 Hypothermia, not associated with low environmental temperature: Secondary | ICD-10-CM | POA: Diagnosis present

## 2018-08-06 DIAGNOSIS — I2510 Atherosclerotic heart disease of native coronary artery without angina pectoris: Secondary | ICD-10-CM | POA: Diagnosis present

## 2018-08-06 DIAGNOSIS — M310 Hypersensitivity angiitis: Secondary | ICD-10-CM | POA: Diagnosis present

## 2018-08-06 DIAGNOSIS — R6521 Severe sepsis with septic shock: Secondary | ICD-10-CM | POA: Diagnosis present

## 2018-08-06 DIAGNOSIS — N186 End stage renal disease: Secondary | ICD-10-CM | POA: Diagnosis present

## 2018-08-06 DIAGNOSIS — J181 Lobar pneumonia, unspecified organism: Secondary | ICD-10-CM | POA: Diagnosis present

## 2018-08-06 DIAGNOSIS — R0902 Hypoxemia: Secondary | ICD-10-CM | POA: Diagnosis not present

## 2018-08-06 DIAGNOSIS — A419 Sepsis, unspecified organism: Secondary | ICD-10-CM

## 2018-08-06 DIAGNOSIS — I071 Rheumatic tricuspid insufficiency: Secondary | ICD-10-CM | POA: Diagnosis present

## 2018-08-06 DIAGNOSIS — J9600 Acute respiratory failure, unspecified whether with hypoxia or hypercapnia: Secondary | ICD-10-CM

## 2018-08-06 DIAGNOSIS — I9589 Other hypotension: Secondary | ICD-10-CM | POA: Diagnosis present

## 2018-08-06 DIAGNOSIS — Z992 Dependence on renal dialysis: Secondary | ICD-10-CM

## 2018-08-06 DIAGNOSIS — Z452 Encounter for adjustment and management of vascular access device: Secondary | ICD-10-CM

## 2018-08-06 DIAGNOSIS — E876 Hypokalemia: Secondary | ICD-10-CM | POA: Diagnosis not present

## 2018-08-06 DIAGNOSIS — D631 Anemia in chronic kidney disease: Secondary | ICD-10-CM | POA: Diagnosis present

## 2018-08-06 DIAGNOSIS — J9601 Acute respiratory failure with hypoxia: Secondary | ICD-10-CM | POA: Diagnosis present

## 2018-08-06 DIAGNOSIS — E441 Mild protein-calorie malnutrition: Secondary | ICD-10-CM | POA: Diagnosis present

## 2018-08-06 DIAGNOSIS — E875 Hyperkalemia: Secondary | ICD-10-CM | POA: Diagnosis not present

## 2018-08-06 DIAGNOSIS — R110 Nausea: Secondary | ICD-10-CM | POA: Diagnosis not present

## 2018-08-06 DIAGNOSIS — G4733 Obstructive sleep apnea (adult) (pediatric): Secondary | ICD-10-CM | POA: Diagnosis present

## 2018-08-06 DIAGNOSIS — N179 Acute kidney failure, unspecified: Secondary | ICD-10-CM | POA: Diagnosis not present

## 2018-08-06 DIAGNOSIS — R404 Transient alteration of awareness: Secondary | ICD-10-CM | POA: Diagnosis not present

## 2018-08-06 DIAGNOSIS — Z7901 Long term (current) use of anticoagulants: Secondary | ICD-10-CM | POA: Diagnosis not present

## 2018-08-06 DIAGNOSIS — Z953 Presence of xenogenic heart valve: Secondary | ICD-10-CM

## 2018-08-06 DIAGNOSIS — N2581 Secondary hyperparathyroidism of renal origin: Secondary | ICD-10-CM | POA: Diagnosis present

## 2018-08-06 DIAGNOSIS — I272 Pulmonary hypertension, unspecified: Secondary | ICD-10-CM | POA: Diagnosis present

## 2018-08-06 DIAGNOSIS — R0609 Other forms of dyspnea: Secondary | ICD-10-CM | POA: Diagnosis not present

## 2018-08-06 DIAGNOSIS — R531 Weakness: Secondary | ICD-10-CM | POA: Diagnosis not present

## 2018-08-06 DIAGNOSIS — Y95 Nosocomial condition: Secondary | ICD-10-CM | POA: Diagnosis present

## 2018-08-06 DIAGNOSIS — I480 Paroxysmal atrial fibrillation: Secondary | ICD-10-CM | POA: Diagnosis present

## 2018-08-06 DIAGNOSIS — E872 Acidosis: Secondary | ICD-10-CM | POA: Diagnosis present

## 2018-08-06 DIAGNOSIS — R579 Shock, unspecified: Secondary | ICD-10-CM | POA: Diagnosis not present

## 2018-08-06 DIAGNOSIS — Z87891 Personal history of nicotine dependence: Secondary | ICD-10-CM | POA: Diagnosis not present

## 2018-08-06 DIAGNOSIS — I120 Hypertensive chronic kidney disease with stage 5 chronic kidney disease or end stage renal disease: Secondary | ICD-10-CM | POA: Diagnosis not present

## 2018-08-06 DIAGNOSIS — Z4682 Encounter for fitting and adjustment of non-vascular catheter: Secondary | ICD-10-CM | POA: Diagnosis not present

## 2018-08-06 DIAGNOSIS — J90 Pleural effusion, not elsewhere classified: Secondary | ICD-10-CM | POA: Diagnosis not present

## 2018-08-06 DIAGNOSIS — J189 Pneumonia, unspecified organism: Secondary | ICD-10-CM

## 2018-08-06 DIAGNOSIS — J9690 Respiratory failure, unspecified, unspecified whether with hypoxia or hypercapnia: Secondary | ICD-10-CM | POA: Diagnosis not present

## 2018-08-06 DIAGNOSIS — R21 Rash and other nonspecific skin eruption: Secondary | ICD-10-CM | POA: Diagnosis present

## 2018-08-06 HISTORY — DX: Encephalopathy, unspecified: G93.40

## 2018-08-06 LAB — I-STAT CHEM 8, ED
BUN: 69 mg/dL — ABNORMAL HIGH (ref 6–20)
CALCIUM ION: 1 mmol/L — AB (ref 1.15–1.40)
Chloride: 105 mmol/L (ref 98–111)
Creatinine, Ser: 7.9 mg/dL — ABNORMAL HIGH (ref 0.61–1.24)
GLUCOSE: 96 mg/dL (ref 70–99)
HCT: 34 % — ABNORMAL LOW (ref 39.0–52.0)
HEMOGLOBIN: 11.6 g/dL — AB (ref 13.0–17.0)
Potassium: 5.7 mmol/L — ABNORMAL HIGH (ref 3.5–5.1)
Sodium: 137 mmol/L (ref 135–145)
TCO2: 22 mmol/L (ref 22–32)

## 2018-08-06 LAB — I-STAT ARTERIAL BLOOD GAS, ED
BICARBONATE: 24.3 mmol/L (ref 20.0–28.0)
O2 Saturation: 100 %
TCO2: 25 mmol/L (ref 22–32)
pCO2 arterial: 36.6 mmHg (ref 32.0–48.0)
pH, Arterial: 7.426 (ref 7.350–7.450)
pO2, Arterial: 460 mmHg — ABNORMAL HIGH (ref 83.0–108.0)

## 2018-08-06 LAB — POCT I-STAT 3, ART BLOOD GAS (G3+)
Acid-base deficit: 3 mmol/L — ABNORMAL HIGH (ref 0.0–2.0)
Bicarbonate: 21.3 mmol/L (ref 20.0–28.0)
O2 Saturation: 100 %
Patient temperature: 96.5
TCO2: 22 mmol/L (ref 22–32)
pCO2 arterial: 32.1 mmHg (ref 32.0–48.0)
pH, Arterial: 7.425 (ref 7.350–7.450)
pO2, Arterial: 290 mmHg — ABNORMAL HIGH (ref 83.0–108.0)

## 2018-08-06 LAB — CBC WITH DIFFERENTIAL/PLATELET
ABS IMMATURE GRANULOCYTES: 0 10*3/uL (ref 0.0–0.1)
BASOS ABS: 0 10*3/uL (ref 0.0–0.1)
BASOS PCT: 1 %
EOS ABS: 0 10*3/uL (ref 0.0–0.7)
Eosinophils Relative: 0 %
HCT: 34 % — ABNORMAL LOW (ref 39.0–52.0)
Hemoglobin: 10.9 g/dL — ABNORMAL LOW (ref 13.0–17.0)
IMMATURE GRANULOCYTES: 1 %
Lymphocytes Relative: 13 %
Lymphs Abs: 0.9 10*3/uL (ref 0.7–4.0)
MCH: 28.1 pg (ref 26.0–34.0)
MCHC: 32.1 g/dL (ref 30.0–36.0)
MCV: 87.6 fL (ref 78.0–100.0)
Monocytes Absolute: 0.7 10*3/uL (ref 0.1–1.0)
Monocytes Relative: 10 %
NEUTROS ABS: 4.9 10*3/uL (ref 1.7–7.7)
Neutrophils Relative %: 75 %
Platelets: 118 10*3/uL — ABNORMAL LOW (ref 150–400)
RBC: 3.88 MIL/uL — AB (ref 4.22–5.81)
RDW: 15.9 % — ABNORMAL HIGH (ref 11.5–15.5)
WBC: 6.5 10*3/uL (ref 4.0–10.5)

## 2018-08-06 LAB — MAGNESIUM: MAGNESIUM: 2 mg/dL (ref 1.7–2.4)

## 2018-08-06 LAB — COMPREHENSIVE METABOLIC PANEL
ALT: 16 U/L (ref 0–44)
ANION GAP: 14 (ref 5–15)
AST: 30 U/L (ref 15–41)
Albumin: 3.4 g/dL — ABNORMAL LOW (ref 3.5–5.0)
Alkaline Phosphatase: 120 U/L (ref 38–126)
BILIRUBIN TOTAL: 1.8 mg/dL — AB (ref 0.3–1.2)
BUN: 76 mg/dL — ABNORMAL HIGH (ref 6–20)
CHLORIDE: 97 mmol/L — AB (ref 98–111)
CO2: 23 mmol/L (ref 22–32)
Calcium: 9.1 mg/dL (ref 8.9–10.3)
Creatinine, Ser: 8.05 mg/dL — ABNORMAL HIGH (ref 0.61–1.24)
GFR calc Af Amer: 8 mL/min — ABNORMAL LOW (ref >60)
GFR, EST NON AFRICAN AMERICAN: 7 mL/min — AB (ref >60)
Glucose, Bld: 102 mg/dL — ABNORMAL HIGH (ref 70–99)
Sodium: 134 mmol/L — ABNORMAL LOW (ref 135–145)
TOTAL PROTEIN: 7.6 g/dL (ref 6.5–8.1)

## 2018-08-06 LAB — PROCALCITONIN: Procalcitonin: 0.62 ng/mL

## 2018-08-06 LAB — LACTIC ACID, PLASMA
LACTIC ACID, VENOUS: 2.9 mmol/L — AB (ref 0.5–1.9)
LACTIC ACID, VENOUS: 2.3 mmol/L — AB (ref 0.5–1.9)

## 2018-08-06 LAB — TYPE AND SCREEN
ABO/RH(D): B POS
Antibody Screen: NEGATIVE

## 2018-08-06 LAB — I-STAT TROPONIN, ED: Troponin i, poc: 0.08 ng/mL (ref 0.00–0.08)

## 2018-08-06 LAB — MRSA PCR SCREENING: MRSA by PCR: NEGATIVE

## 2018-08-06 LAB — GLUCOSE, CAPILLARY
Glucose-Capillary: 78 mg/dL (ref 70–99)
Glucose-Capillary: 87 mg/dL (ref 70–99)

## 2018-08-06 LAB — CBG MONITORING, ED
GLUCOSE-CAPILLARY: 140 mg/dL — AB (ref 70–99)
GLUCOSE-CAPILLARY: 79 mg/dL (ref 70–99)

## 2018-08-06 LAB — PHOSPHORUS: Phosphorus: 4.4 mg/dL (ref 2.5–4.6)

## 2018-08-06 LAB — PROTIME-INR
INR: 4.24 — AB
PROTHROMBIN TIME: 40.5 s — AB (ref 11.4–15.2)

## 2018-08-06 LAB — I-STAT CG4 LACTIC ACID, ED
LACTIC ACID, VENOUS: 2.83 mmol/L — AB (ref 0.5–1.9)
LACTIC ACID, VENOUS: 2.45 mmol/L — AB (ref 0.5–1.9)

## 2018-08-06 LAB — CORTISOL: Cortisol, Plasma: 7.7 ug/dL

## 2018-08-06 LAB — AMMONIA: AMMONIA: 43 umol/L — AB (ref 9–35)

## 2018-08-06 MED ORDER — ETOMIDATE 2 MG/ML IV SOLN
INTRAVENOUS | Status: AC | PRN
  Administered 2018-08-06: 20 mg via INTRAVENOUS

## 2018-08-06 MED ORDER — SODIUM CHLORIDE 0.9 % IV BOLUS: 1000.0000 mL | Freq: Once | INTRAVENOUS | Status: DC

## 2018-08-06 MED ORDER — SODIUM CHLORIDE 0.9 % IV SOLN
2.0000 g | Freq: Two times a day (BID) | INTRAVENOUS | Status: DC
  Administered 2018-08-07 – 2018-08-08 (×3): 2 g via INTRAVENOUS
  Filled 2018-08-06 (×3): qty 2

## 2018-08-06 MED ORDER — SODIUM CHLORIDE 0.9 % IV SOLN: 2.0000 g | INTRAVENOUS | Status: DC

## 2018-08-06 MED ORDER — SODIUM CHLORIDE 0.9 % IV BOLUS
1500.0000 mL | Freq: Once | INTRAVENOUS | Status: AC
  Administered 2018-08-06: 1500 mL via INTRAVENOUS

## 2018-08-06 MED ORDER — SODIUM CHLORIDE 0.9 % IV SOLN: INTRAVENOUS | Status: DC | PRN

## 2018-08-06 MED ORDER — INSULIN ASPART 100 UNIT/ML ~~LOC~~ SOLN
0.0000 [IU] | SUBCUTANEOUS | Status: DC
  Administered 2018-08-07 (×3): 2 [IU] via SUBCUTANEOUS
  Administered 2018-08-08 (×3): 3 [IU] via SUBCUTANEOUS
  Administered 2018-08-08: 2 [IU] via SUBCUTANEOUS
  Administered 2018-08-08: 3 [IU] via SUBCUTANEOUS
  Administered 2018-08-09 (×2): 2 [IU] via SUBCUTANEOUS
  Administered 2018-08-10: 3 [IU] via SUBCUTANEOUS
  Administered 2018-08-12: 2 [IU] via SUBCUTANEOUS

## 2018-08-06 MED ORDER — HYDROCORTISONE NA SUCCINATE PF 100 MG IJ SOLR
50.0000 mg | Freq: Four times a day (QID) | INTRAMUSCULAR | Status: DC
  Administered 2018-08-06 – 2018-08-08 (×7): 50 mg via INTRAVENOUS
  Filled 2018-08-06 (×7): qty 2

## 2018-08-06 MED ORDER — "THROMBI-PAD 3""X3"" EX PADS"
1.0000 | MEDICATED_PAD | Freq: Once | CUTANEOUS | Status: AC
  Administered 2018-08-07: 1 via TOPICAL
  Filled 2018-08-06 (×3): qty 1

## 2018-08-06 MED ORDER — FENTANYL CITRATE (PF) 100 MCG/2ML IJ SOLN
50.0000 ug | Freq: Once | INTRAMUSCULAR | Status: AC
  Administered 2018-08-06: 50 ug via INTRAVENOUS

## 2018-08-06 MED ORDER — MIDAZOLAM HCL 2 MG/2ML IJ SOLN: 2.0000 mg | Freq: Once | INTRAMUSCULAR | Status: DC

## 2018-08-06 MED ORDER — VITAMIN K1 10 MG/ML IJ SOLN
10.0000 mg | Freq: Once | INTRAVENOUS | Status: AC
  Administered 2018-08-06: 10 mg via INTRAVENOUS
  Filled 2018-08-06: qty 1

## 2018-08-06 MED ORDER — VANCOMYCIN HCL 10 G IV SOLR
1500.0000 mg | Freq: Once | INTRAVENOUS | Status: AC
  Administered 2018-08-06: 1500 mg via INTRAVENOUS
  Filled 2018-08-06: qty 1500

## 2018-08-06 MED ORDER — INSULIN ASPART 100 UNIT/ML IV SOLN
5.0000 [IU] | Freq: Once | INTRAVENOUS | Status: AC
  Administered 2018-08-06: 5 [IU] via INTRAVENOUS
  Filled 2018-08-06: qty 0.05

## 2018-08-06 MED ORDER — HEPARIN (PORCINE) 2000 UNITS/L FOR CRRT
INTRAVENOUS_CENTRAL | Status: DC | PRN
  Administered 2018-08-06 – 2018-08-08 (×2): via INTRAVENOUS_CENTRAL
  Filled 2018-08-06: qty 1000

## 2018-08-06 MED ORDER — SODIUM CHLORIDE 0.9% IV SOLUTION
Freq: Once | INTRAVENOUS | Status: AC
  Administered 2018-08-06: 19:00:00 via INTRAVENOUS

## 2018-08-06 MED ORDER — PRISMASOL BGK 4/2.5 32-4-2.5 MEQ/L IV SOLN
INTRAVENOUS | Status: DC
  Administered 2018-08-06: 23:00:00 via INTRAVENOUS_CENTRAL
  Filled 2018-08-06 (×3): qty 5000

## 2018-08-06 MED ORDER — NOREPINEPHRINE 4 MG/250ML-% IV SOLN
0.0000 ug/min | Freq: Once | INTRAVENOUS | Status: AC
  Administered 2018-08-06: 10 ug/min via INTRAVENOUS

## 2018-08-06 MED ORDER — PRISMASOL BGK 4/2.5 32-4-2.5 MEQ/L IV SOLN
INTRAVENOUS | Status: DC
  Administered 2018-08-06 – 2018-08-07 (×2): via INTRAVENOUS_CENTRAL
  Filled 2018-08-06 (×7): qty 5000

## 2018-08-06 MED ORDER — FAMOTIDINE 20 MG PO TABS: 20.0000 mg | ORAL_TABLET | Freq: Every day | ORAL | Status: DC

## 2018-08-06 MED ORDER — FENTANYL CITRATE (PF) 100 MCG/2ML IJ SOLN
50.0000 ug | Freq: Once | INTRAMUSCULAR | Status: AC
  Administered 2018-08-06: 50 ug via INTRAVENOUS
  Filled 2018-08-06: qty 2

## 2018-08-06 MED ORDER — MIDAZOLAM HCL 2 MG/2ML IJ SOLN
2.0000 mg | INTRAMUSCULAR | Status: DC | PRN
  Administered 2018-08-06 – 2018-08-07 (×3): 2 mg via INTRAVENOUS
  Filled 2018-08-06 (×3): qty 2

## 2018-08-06 MED ORDER — FENTANYL CITRATE (PF) 100 MCG/2ML IJ SOLN
100.0000 ug | INTRAMUSCULAR | Status: DC | PRN
  Filled 2018-08-06 (×2): qty 2

## 2018-08-06 MED ORDER — NOREPINEPHRINE 4 MG/250ML-% IV SOLN
0.0000 ug/min | Freq: Once | INTRAVENOUS | Status: AC
  Administered 2018-08-06: 2 ug/min via INTRAVENOUS

## 2018-08-06 MED ORDER — HEPARIN SODIUM (PORCINE) 1000 UNIT/ML DIALYSIS
1000.0000 [IU] | INTRAMUSCULAR | Status: DC | PRN
  Administered 2018-08-09: 2400 [IU] via INTRAVENOUS_CENTRAL
  Filled 2018-08-06: qty 6
  Filled 2018-08-06: qty 3
  Filled 2018-08-06 (×2): qty 6

## 2018-08-06 MED ORDER — VANCOMYCIN HCL IN DEXTROSE 750-5 MG/150ML-% IV SOLN: 750.0000 mg | INTRAVENOUS | Status: DC

## 2018-08-06 MED ORDER — MIDAZOLAM HCL 2 MG/2ML IJ SOLN
INTRAMUSCULAR | Status: AC
  Filled 2018-08-06: qty 2

## 2018-08-06 MED ORDER — NOREPINEPHRINE 4 MG/250ML-% IV SOLN
INTRAVENOUS | Status: AC
  Filled 2018-08-06: qty 250

## 2018-08-06 MED ORDER — SODIUM CHLORIDE 0.9 % IV SOLN
1.0000 g | Freq: Once | INTRAVENOUS | Status: AC
  Administered 2018-08-06: 1 g via INTRAVENOUS
  Filled 2018-08-06: qty 10

## 2018-08-06 MED ORDER — DEXTROSE 50 % IV SOLN
1.0000 | Freq: Once | INTRAVENOUS | Status: AC
  Administered 2018-08-06: 50 mL via INTRAVENOUS
  Filled 2018-08-06: qty 50

## 2018-08-06 MED ORDER — MIDAZOLAM HCL 2 MG/2ML IJ SOLN
2.0000 mg | INTRAMUSCULAR | Status: DC | PRN
  Filled 2018-08-06: qty 2

## 2018-08-06 MED ORDER — FENTANYL CITRATE (PF) 100 MCG/2ML IJ SOLN: 50.0000 ug | Freq: Once | INTRAMUSCULAR | Status: DC

## 2018-08-06 MED ORDER — ALBUTEROL SULFATE (2.5 MG/3ML) 0.083% IN NEBU
10.0000 mg | INHALATION_SOLUTION | Freq: Once | RESPIRATORY_TRACT | Status: AC
  Administered 2018-08-06: 10 mg via RESPIRATORY_TRACT
  Filled 2018-08-06: qty 12

## 2018-08-06 MED ORDER — IPRATROPIUM-ALBUTEROL 0.5-2.5 (3) MG/3ML IN SOLN: 3.0000 mL | Freq: Four times a day (QID) | RESPIRATORY_TRACT | Status: DC | PRN

## 2018-08-06 MED ORDER — VANCOMYCIN HCL IN DEXTROSE 750-5 MG/150ML-% IV SOLN
750.0000 mg | INTRAVENOUS | Status: DC
  Administered 2018-08-07 – 2018-08-08 (×2): 750 mg via INTRAVENOUS
  Filled 2018-08-06 (×2): qty 150

## 2018-08-06 MED ORDER — SODIUM CHLORIDE 0.9 % IV SOLN
2.0000 g | Freq: Once | INTRAVENOUS | Status: AC
  Administered 2018-08-06: 2 g via INTRAVENOUS
  Filled 2018-08-06: qty 2

## 2018-08-06 MED ORDER — PRISMASOL BGK 4/2.5 32-4-2.5 MEQ/L IV SOLN
INTRAVENOUS | Status: DC
  Administered 2018-08-06 – 2018-08-08 (×5): via INTRAVENOUS_CENTRAL
  Filled 2018-08-06 (×7): qty 5000

## 2018-08-06 MED ORDER — ATORVASTATIN CALCIUM 10 MG PO TABS
20.0000 mg | ORAL_TABLET | Freq: Every day | ORAL | Status: DC
  Administered 2018-08-07 – 2018-08-12 (×6): 20 mg via ORAL
  Filled 2018-08-06: qty 2
  Filled 2018-08-06: qty 1
  Filled 2018-08-06: qty 2
  Filled 2018-08-06 (×2): qty 1
  Filled 2018-08-06: qty 2

## 2018-08-06 MED ORDER — MIDAZOLAM HCL 2 MG/2ML IJ SOLN
2.0000 mg | Freq: Once | INTRAMUSCULAR | Status: AC
  Administered 2018-08-06: 2 mg via INTRAVENOUS
  Filled 2018-08-06: qty 2

## 2018-08-06 MED ORDER — MIDODRINE HCL 5 MG PO TABS: 5.0000 mg | ORAL_TABLET | Freq: Three times a day (TID) | ORAL | Status: DC

## 2018-08-06 MED ORDER — PHENYLEPHRINE 40 MCG/ML (10ML) SYRINGE FOR IV PUSH (FOR BLOOD PRESSURE SUPPORT)
200.0000 ug | PREFILLED_SYRINGE | Freq: Once | INTRAVENOUS | Status: AC
  Administered 2018-08-06: 200 ug via INTRAVENOUS
  Filled 2018-08-06: qty 5

## 2018-08-06 MED ORDER — NOREPINEPHRINE 4 MG/250ML-% IV SOLN
0.0000 ug/min | INTRAVENOUS | Status: DC
  Administered 2018-08-06: 10 ug/min via INTRAVENOUS
  Filled 2018-08-06: qty 250

## 2018-08-06 MED ORDER — MIDAZOLAM HCL 2 MG/2ML IJ SOLN
2.0000 mg | Freq: Once | INTRAMUSCULAR | Status: AC
  Administered 2018-08-06: 2 mg via INTRAVENOUS

## 2018-08-06 MED ORDER — FAMOTIDINE 20 MG PO TABS
20.0000 mg | ORAL_TABLET | Freq: Every day | ORAL | Status: DC
  Administered 2018-08-07 – 2018-08-12 (×6): 20 mg
  Filled 2018-08-06 (×6): qty 1

## 2018-08-06 MED ORDER — VANCOMYCIN HCL IN DEXTROSE 1-5 GM/200ML-% IV SOLN
1000.0000 mg | Freq: Once | INTRAVENOUS | Status: DC
  Filled 2018-08-06: qty 200

## 2018-08-06 MED ORDER — SUCCINYLCHOLINE CHLORIDE 20 MG/ML IJ SOLN
INTRAMUSCULAR | Status: AC | PRN
  Administered 2018-08-06: 100 mg via INTRAVENOUS

## 2018-08-06 MED ORDER — MIDODRINE HCL 5 MG PO TABS
5.0000 mg | ORAL_TABLET | Freq: Three times a day (TID) | ORAL | Status: DC
  Administered 2018-08-07 – 2018-08-09 (×7): 5 mg
  Filled 2018-08-06 (×7): qty 1

## 2018-08-06 MED ORDER — HEPARIN SODIUM (PORCINE) 1000 UNIT/ML IJ SOLN
2400.0000 [IU] | Freq: Once | INTRAMUSCULAR | Status: AC
  Administered 2018-08-06: 2400 [IU] via INTRAVENOUS
  Filled 2018-08-06: qty 3

## 2018-08-06 MED ORDER — METRONIDAZOLE IN NACL 5-0.79 MG/ML-% IV SOLN
500.0000 mg | Freq: Three times a day (TID) | INTRAVENOUS | Status: DC
  Administered 2018-08-06 – 2018-08-07 (×3): 500 mg via INTRAVENOUS
  Filled 2018-08-06 (×4): qty 100

## 2018-08-06 MED ORDER — FENTANYL CITRATE (PF) 100 MCG/2ML IJ SOLN
INTRAMUSCULAR | Status: AC
  Filled 2018-08-06: qty 2

## 2018-08-06 MED ORDER — FENTANYL CITRATE (PF) 100 MCG/2ML IJ SOLN
100.0000 ug | INTRAMUSCULAR | Status: DC | PRN
  Administered 2018-08-06 – 2018-08-07 (×5): 100 ug via INTRAVENOUS
  Filled 2018-08-06 (×5): qty 2

## 2018-08-06 NOTE — Procedures (Signed)
Intubation Procedure Note Michael [name] [id number] [birth year]  Procedure: Intubation Indications: Respiratory insufficiency  Procedure Details Consent: Risks of procedure as well as the alternatives and risks of each were explained to the (patient/caregiver).  Consent for procedure obtained. Time Out: Verified patient identification, verified procedure, site/side was marked, verified correct patient position, special equipment/implants available, medications/allergies/relevent history reviewed, required imaging and test results available.  Performed  Maximum sterile technique was used including gloves and hand hygiene.  3  Patient intubated with a glide-scope with an S3 blade.   Evaluation Hemodynamic Status: BP stable throughout; O2 sats: stable throughout Patient's Current Condition: stable Complications: No apparent complications Patient did tolerate procedure well. Chest X-ray ordered to verify placement.  CXR: pending.   Ander Purpura 08/06/2018

## 2018-08-06 NOTE — Progress Notes (Signed)
Pharmacy Antibiotic Note  Mohamedamin Sweda is a 57 y.o. male admitted on 08/06/2018 with sepsis.  Pharmacy has been consulted for vancomycin and cefepime dosing. Patient has HD MWF but did not receive HD today, was sent to ER due to AMS and hypotension. WBC 6.5, lac 2.45, no temp recorded.   Plan: Vancomycin 150mg  IV, then 750mg  post HD sessions Cefepime 2g x1 then 2g post HD sessions. F/u inpatient HD schedule F/u LOT/de-escalation, cultures, troughs prn  ADDENDUM: Pt now starting CRRT, will change antibiotics: Vancomycin 750 mg IV q 24 hrs Cefepime 2g IV q 12 hrs   Weight: 167 lb 5.3 oz (75.9 kg)  Temp (24hrs), Avg:94.8 F (34.9 C), Min:94.1 F (34.5 C), Max:96.3 F (35.7 C)  Recent Labs  Lab 08/05/18 1144 08/06/18 1408 08/06/18 1431 08/06/18 1535 08/06/18 1751 08/06/18 1852  WBC  --  6.5  --   --   --   --   CREATININE 6.25* 8.05*  --   --  7.90*  --   LATICACIDVEN  --   --  2.83* 2.45*  --  2.9*    Estimated Creatinine Clearance: 9.7 mL/min (A) (by C-G formula based on SCr of 7.9 mg/dL (H)).    No Known Allergies  Antimicrobials this admission: vanc 9/20 >>  cefepime 9/20 >>   Dose adjustments this admission:  Microbiology results: 9/20 BCx:    Thank you for allowing pharmacy to be a part of this patient's care.  Harrietta Guardian, PharmD PGY1 Pharmacy Resident 08/06/2018    8:40 PM

## 2018-08-06 NOTE — Progress Notes (Signed)
Pharmacy Antibiotic Note  Michael [name] is a 57 y.o. male admitted on 08/06/2018 with sepsis.  Pharmacy has been consulted for vancomycin and cefepime dosing. Patient has HD MWF but did not receive HD today, was sent to ER due to AMS and hypotension. WBC 6.5, lac 2.45, no temp recorded.   Plan: Vancomycin 150mg  IV, then 750mg  post HD sessions Cefepime 2g x1 then 2g post HD sessions. F/u inpatient HD schedule F/u LOT/de-escalation, cultures, troughs prn  Weight: (S) (per EMS HD said his weight was up 7kg today. )  No data recorded.  Recent Labs  Lab 08/05/18 1144 08/06/18 1408 08/06/18 1431 08/06/18 1535  WBC  --  6.5  --   --   CREATININE 6.25* 8.05*  --   --   LATICACIDVEN  --   --  2.83* 2.45*    Estimated Creatinine Clearance: 8.7 mL/min (A) (by C-G formula based on SCr of 8.05 mg/dL (H)).    No Known Allergies  Antimicrobials this admission: vanc 9/20 >>  cefepime 9/20 >>   Dose adjustments this admission:  Microbiology results: 9/20 BCx:    Thank you for allowing pharmacy to be a part of this patient's care.  Harrietta Guardian, PharmD PGY1 Pharmacy Resident 08/06/2018    5:20 PM

## 2018-08-06 NOTE — Progress Notes (Signed)
RT NOTES: Patient placed on vent PRVC 500/18/100%/+12. Will continue to monitor.

## 2018-08-06 NOTE — Procedures (Signed)
Arterial Catheter Insertion Procedure Note Michael [name] [id number] Mar 31, 1961  Procedure: Insertion of Arterial Catheter  Indications: Blood pressure monitoring and Frequent blood sampling  Procedure Details Consent: Unable to obtain consent because of emergent medical necessity. Time Out: Verified patient identification, verified procedure, site/side was marked, verified correct patient position, special equipment/implants available, medications/allergies/relevent history reviewed, required imaging and test results available.  Performed  Maximum sterile technique was used including antiseptics, cap, gloves, gown, hand hygiene, mask and sheet. Skin prep: Chlorhexidine; local anesthetic administered 20 gauge catheter was inserted into right radial artery using the Seldinger technique. ULTRASOUND GUIDANCE USED: NO Evaluation Blood flow good; BP tracing good. Complications: No apparent complications.   Myrtis Ser 08/06/2018

## 2018-08-06 NOTE — Consult Note (Signed)
[facility] Renal Consultation Note    Indication for Consultation:  Management of ESRD/hemodialysis; anemia, hypertension/volume and secondary hyperparathyroidism  HPI: Michael [name] is a 57 y.o. male.  Patient is a 57 yo Asian male with a PMH significant for ESRD 2/2 cardiorenal syndrome, on HD at [facility], first starting on 07/29/17, chronic CHF/RV failure (has diastolic dysfunction as well as h/o decreased EF in the past), CAD, h/o polysubstance abuse, DMT2, OSA, pulmonary HTN, PAF, mitral valve/tricuspid valve repair, A. Fib s/p MAZE procedure on coumadin, and multiple cardiac arrests while on HD.  He presented to [facility] ED earlier today with AMS when he was brought to HD.  His mental status continued to decline and developed hypoxia and was intubated in the ED.  He was also hypotensive and started on pressors.  CXR revealed RLL infiltrate and he was started on empiric broad spectrum antibiotics.  He is being admitted to the MICU and we were consulted to help manage his ESRD and dialysis needs.  Labs were notable for a K of >7.5, CO2 23, BUN 76, Cr 8.05, alb 3.4, Hgb 10.9, WBC 6.5, plt 118  Of note, he was + for methamphetamines several weeks ago and has had a purpuric rash on his extremities which was biopsied a month ago and showed leukocytoclastic vasculitis, however ANCA and other auto-immune studies were negative.   He has denied drug use despite the + tox screen and a pipe was found on his person a few weeks ago.    Past Medical History:  Diagnosis Date  . Anemia of chronic disease   . Asthma    said he does not know  . Chronic kidney disease (CKD), stage IV (severe) ([place])   . Chronic systolic CHF (congestive heart failure) (HCC)    a. prior EF normal; b. Echo 10/16: Mild LVH, EF 30-35%, mitral valve bioprosthesis present without evidence of stenosis or regurgitation, severe LAE, mild RVE with mild to moderately reduced RVSF, moderate RAE  .  Cirrhosis ([place])   . Coronary artery disease   . Diabetes mellitus type 2 in obese ([place])   . Diabetes mellitus without complication ([place])   . Dyspnea   . GERD (gastroesophageal reflux disease)   . H/O tricuspid valve repair 2012  . History of echocardiogram    Echo at [facility] in [facility] 4/12: mod LVH, EF 60-65%, mod LAE, tissue MVR ok, mod TR, mod pulmo HTN with RVSP 48 mmHg  . Hypertension   . Obstructive sleep apnea   . Paroxysmal atrial fibrillation (HCC)    s/p Maze procedure at time of MVR in 2011  . Pulmonary HTN ([place])   . 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 Thrombocytopenia ([place])    Past Surgical History:  Procedure Laterality Date  . AV FISTULA PLACEMENT Left 07/13/2017   Procedure: LEFT ARM ARTERIOVENOUS (AV) FISTULA CREATION;  Surgeon: Waynetta Sandy, MD;  Location: [place];  Service: Vascular;  Laterality: Left;  . BASCILIC VEIN TRANSPOSITION Left 11/03/2017   Procedure: BASILIC VEIN TRANSPOSITION SECOND STAGE;  Surgeon: Waynetta Sandy, MD;  Location: [place];  Service: Vascular;  Laterality: Left;  . CARDIAC SURGERY    . CARDIAC VALVE REPLACEMENT  2011  . CARDIOVERSION N/A 07/09/2017   Procedure: CARDIOVERSION;  Surgeon: Larey Dresser, MD;  Location: [facility] ENDOSCOPY;  Service: Cardiovascular;  Laterality: N/A;  . COLONOSCOPY N/A 07/16/2017   Procedure: COLONOSCOPY;  Surgeon: Jerene Bears, MD;  Location: [facility]  ENDOSCOPY;  Service: Gastroenterology;  Laterality: N/A;  . ESOPHAGOGASTRODUODENOSCOPY N/A 07/16/2017   Procedure: ESOPHAGOGASTRODUODENOSCOPY (EGD);  Surgeon: Jerene Bears, MD;  Location: [facility] ENDOSCOPY;  Service: Gastroenterology;  Laterality: N/A;  . INSERTION OF DIALYSIS CATHETER Right 07/13/2017   Procedure: INSERTION OF DIALYSIS CATHETER RIGHT INTERNAL JUGULAR;  Surgeon: Waynetta Sandy, MD;  Location: [place];  Service: Vascular;  Laterality: Right;  . RIGHT HEART CATH N/A 06/29/2017   Procedure: RIGHT HEART CATH;  Surgeon:  Larey Dresser, MD;  Location: [place] CV LAB;  Service: Cardiovascular;  Laterality: N/A;  . RIGHT/LEFT HEART CATH AND CORONARY ANGIOGRAPHY N/A 08/19/2017   Procedure: RIGHT/LEFT HEART CATH AND CORONARY ANGIOGRAPHY;  Surgeon: Leonie Man, MD;  Location: [place] CV LAB;  Service: Cardiovascular;  Laterality: N/A;  . TEE WITHOUT CARDIOVERSION N/A 07/09/2017   Procedure: TRANSESOPHAGEAL ECHOCARDIOGRAM (TEE);  Surgeon: Larey Dresser, MD;  Location: [facility] ENDOSCOPY;  Service: Cardiovascular;  Laterality: N/A;   Family History:   Family History  Problem Relation Age of Onset  . Heart attack Neg Hx    Social History:  reports that he has quit smoking. His smoking use included cigarettes. He smoked 1.00 pack per day. He has never used smokeless tobacco. He reports that he does not drink alcohol or use drugs. No Known Allergies Prior to Admission medications   Medication Sig Start Date End Date Taking? Authorizing Provider  acetaminophen (TYLENOL) 325 MG tablet Take 650 mg by mouth every 6 (six) hours.     [provider]  allopurinol (ZYLOPRIM) 300 MG tablet Take 300 mg by mouth as directed.    [provider]  amiodarone (PACERONE) 200 MG tablet TAKE 1 TABLET BY MOUTH TWICE A DAY 07/14/18   Fay Records, MD  atorvastatin (LIPITOR) 20 MG tablet TAKE 1 TABLET (20 MG TOTAL) BY MOUTH DAILY. PLEASE KEEP UPCOMING APPT. [place] YOU 06/07/18   Fay Records, MD  calcium acetate (PHOSLO) 667 MG capsule Take 1 capsule (667 mg total) by mouth 3 (three) times daily with meals. Please keep upcoming appt. Thank You 03/09/18   Fay Records, MD  colchicine 0.6 MG tablet Take 0.5 tablets (0.3 mg total) by mouth 2 (two) times a week. Patient not taking: Reported on 08/05/2018 09/21/17   Tawny Asal, MD  fluticasone [facility]) 50 MCG/ACT nasal spray Place 2 sprays into both nostrils daily. 12/14/17   Bonnielee Haff, MD  folic acid (FOLVITE) 1 MG tablet TAKE 1 TABLET BY MOUTH EVERY DAY  03/03/18   Charlott Rakes, MD  gabapentin (NEURONTIN) 100 MG capsule Take 100 mg by mouth as directed.    [provider]  hydrOXYzine (ATARAX/VISTARIL) 25 MG tablet 1/2-1 tablets tid prn itching 12/24/17   Charlott Rakes, MD  metoprolol tartrate (LOPRESSOR) 25 MG tablet Take 1 tablet (25 mg total) by mouth 2 (two) times daily. 07/22/18   Daune Perch, NP  midodrine (PROAMATINE) 5 MG tablet Take 5 mg by mouth as directed.    [provider]  multivitamin (RENA-VIT) TABS tablet Take 1 tablet by mouth at bedtime. 12/14/17   Bonnielee Haff, MD  omeprazole (PRILOSEC) 20 MG capsule Take 1 capsule (20 mg total) by mouth daily. 03/03/17   Charlott Rakes, MD  oxyCODONE (OXY IR/ROXICODONE) 5 MG immediate release tablet Take 5 mg by mouth every 8 (eight) hours as needed for pain.    [provider]  OXYGEN Inhale 2 L into the lungs daily as needed.  [provider]  tiZANidine (ZANAFLEX) 4 MG tablet Take 1 tablet (4 mg total) by mouth every 8 (eight) hours as needed for muscle spasms. 12/24/17   Charlott Rakes, MD  traMADol (ULTRAM) 50 MG tablet Take 1 tablet (50 mg total) by mouth every 12 (twelve) hours as needed. 06/09/18   Couture, Cortni S, PA-C  warfarin (COUMADIN) 2.5 MG tablet Take 1.5 tablets daily or as directed by Coumadin clinic 08/06/18   Barrett, Evelene Croon, PA-C   Current Facility-Administered Medications  Medication Dose Route Frequency Provider Last Rate Last Dose  . ceFEPIme (MAXIPIME) 2 g in sodium chloride 0.9 % 100 mL IVPB  2 g Intravenous Q M,W,F-1800 Rush Barer, RPH      . famotidine (PEPCID) tablet 20 mg  20 mg Oral Daily Jennelle Human B, NP      . ipratropium-albuterol (DUONEB) 0.5-2.5 (3) MG/3ML nebulizer solution 3 mL  3 mL Nebulization Q6H PRN Jennelle Human B, NP      . metroNIDAZOLE (FLAGYL) IVPB 500 mg  500 mg Intravenous Teena Dunk, MD      . norepinephrine (LEVOPHED) 4mg  in D5W 225mL premix infusion  0-10 mcg/min Intravenous  Titrated Jesus Genera, MD      . vancomycin (VANCOCIN) 1,500 mg in sodium chloride 0.9 % 500 mL IVPB  1,500 mg Intravenous Once Rush Barer, RPH      . [START ON 08/09/2018] vancomycin (VANCOCIN) IVPB 750 mg/150 ml premix  750 mg Intravenous Q M,W,F-HD Rush Barer, [facility]       Labs: Basic Metabolic Panel: Recent Labs  Lab 08/05/18 1144 08/06/18 1408 08/06/18 1751  NA 141 134* 137  K 5.4* >7.5* 5.7*  CL 97 97* 105  CO2 24 23  --   GLUCOSE 190* 102* 96  BUN 58* 76* 69*  CREATININE 6.25* 8.05* 7.90*  CALCIUM 9.6 9.1  --    Liver Function Tests: Recent Labs  Lab 08/05/18 1144 08/06/18 1408  AST 24 30  ALT 14 16  ALKPHOS 163* 120  BILITOT 1.0 1.8*  PROT 8.6* 7.6  ALBUMIN 4.2 3.4*   No results for input(s): LIPASE, AMYLASE in the last 168 hours. Recent Labs  Lab 08/06/18 1600  AMMONIA 43*   CBC: Recent Labs  Lab 08/06/18 1408 08/06/18 1751  WBC 6.5  --   NEUTROABS 4.9  --   HGB 10.9* 11.6*  HCT 34.0* 34.0*  MCV 87.6  --   PLT 118*  --    Cardiac Enzymes: No results for input(s): CKTOTAL, CKMB, CKMBINDEX, TROPONINI in the last 168 hours. CBG: Recent Labs  Lab 08/06/18 1632 08/06/18 1654 08/06/18 1837  GLUCAP 79 140* 87   Iron Studies: No results for input(s): IRON, TIBC, TRANSFERRIN, FERRITIN in the last 72 hours. Studies/Results: Dg Chest 2 View  Result Date: 08/06/2018 CLINICAL DATA:  Hypotension. EXAM: CHEST - 2 VIEW COMPARISON:  Radiograph of December 13, 2017. FINDINGS: Stable cardiomegaly. No pneumothorax is noted. Left lung is clear. Moderate right pleural effusion is noted with probable underlying atelectasis or infiltrate. Sternotomy wires are noted. Bony thorax is unremarkable. IMPRESSION: Moderate right pleural effusion with probable underlying atelectasis or infiltrate. Electronically Signed   By: Marijo Conception, M.D.   On: 08/06/2018 14:44   Dg Chest Portable 1 View  Result Date: 08/06/2018 CLINICAL DATA:  Encounter for  endotracheal tube placement. EXAM: PORTABLE CHEST 1 VIEW COMPARISON:  Two-view chest x-ray 08/06/2018 at 2:35 p.m. FINDINGS: The patient is now intubated.  Endotracheal tube terminates 3.5 cm above the carina. An NG tube has been placed. It courses off the inferior border of the film. The heart is enlarged. Atherosclerotic calcifications are present at the aortic arch. Right lower lobe airspace disease and effusion are again noted. The left lung is clear. IMPRESSION: 1. Satisfactory positioning endotracheal tube. 2. NG tube courses off the inferior border of the film. 3. Persistent right lower lobe airspace disease concerning for pneumonia. 4. Right pleural effusion. Electronically Signed   By: San Morelle M.D.   On: 08/06/2018 16:41    ROS: Review of systems not obtained due to patient factors. Physical Exam: Vitals:   08/06/18 1750 08/06/18 1800 08/06/18 1830 08/06/18 1840  BP: 95/76 (!) 82/64 (!) 88/70   Pulse: 69 73 (!) 56   Resp: 18 17 15    Temp: (!) 94.8 F (34.9 C) (!) 94.6 F (34.8 C) (!) 94.3 F (34.6 C)   SpO2: 97% 97% (!) 89%   Weight:    75.9 kg      Weight change:   Intake/Output Summary (Last 24 hours) at 08/06/2018 1907 Last data filed at 08/06/2018 1612 Gross per 24 hour  Intake 1500 ml  Output -  Net 1500 ml   BP (!) 88/70   Pulse (!) 56   Temp (!) 94.3 F (34.6 C)   Resp 15   Wt 75.9 kg   SpO2 (!) 89%   BMI 27.85 kg/m  General appearance: toxic and unresponsive, and intubated Head: Normocephalic, without obvious abnormality, atraumatic Resp: rhonchi bilaterally Cardio: bradycardic at 56 GI: soft, non-tender; bowel sounds normal; no masses,  no organomegaly Extremities: edema trace pretibial edema and diffuse purpuric rash over arms and legs, LUE AVF +T/B Dialysis Access: LUE AVF +T/B  Assessment/Plan: 1.  Shock - presumably septic from HCAP but also with h/o RV failure and cardiogenic shock.  On pressors and broad spectrum abx per PCCM 2. VDRF-  per PCCM 3.  ESRD -  Too unstable for conventional HD and have asked PCCM to place temp HD catheter once his INR has improved and initiate CVVHD. 4. Hyperkalemia- unable to place temp HD cath due to ^ INR and have treated medically with improvement to 5.7.  Continue to follow and start CVVHD once HD cath can be safely placed 5.  Hypertension/volume  - on pressors and awaiting initiation of CVVHD 6.  Anemia  - stable 7.  Metabolic bone disease -  Binders on hold 8.  Nutrition - mild protein malnutrition 9. H/o polysubstance abuse- have ordered tox screen, recently + for metampetamines and has h/o substance abuse in the past. 10. Purpuric rash/leukocytoclastic vasculitis-  11. Acute encephalopathy- possibly due to sepsis but also concerning for substance abuse 12. Supratherapeutic INR- INR 4.24 agree with FFP 13. Thrombocytopenia - stable, negative autoimmune workup thus far  [name], MD [place] Pager [phone] 08/06/2018, 7:07 PM

## 2018-08-06 NOTE — ED Notes (Signed)
Dr. Ralene Bathe notified of elevated INR

## 2018-08-06 NOTE — ED Provider Notes (Addendum)
[place] EMERGENCY DEPARTMENT Provider Note   CSN: [id number] Arrival date & time: 08/06/18  1323     History   Chief Complaint Chief Complaint  Patient presents with  . Hypotension   Level 5 caveat: Altered mental status   HPI Michael [name] is a 57 y.o. male.  HPI Patient is a 57 year old male who undergoes dialysis Monday Wednesday Friday.  His last treatment was on Wednesday.  He showed up to dialysis today and was found to be altered lethargic and hypotensive and thus was sent to the ER for further evaluation.  On arrival the emergency department the patient cannot provide any decent history.  He seems somewhat sleepy.  He will awaken.  He will follow simple commands.  He will move all 4 extremities.  He will then fall back asleep.  No tachypnea.  Denies abdominal pain.  He has a rash on his hands and legs which he states he has had before.  He denies shortness of breath but has mild hypoxia.   Past Medical History:  Diagnosis Date  . Anemia of chronic disease   . Asthma    said he does not know  . Chronic kidney disease (CKD), stage IV (severe) ([place])   . Chronic systolic CHF (congestive heart failure) (HCC)    a. prior EF normal; b. Echo 10/16: Mild LVH, EF 30-35%, mitral valve bioprosthesis present without evidence of stenosis or regurgitation, severe LAE, mild RVE with mild to moderately reduced RVSF, moderate RAE  . Cirrhosis ([place])   . Coronary artery disease   . Diabetes mellitus type 2 in obese ([place])   . Diabetes mellitus without complication ([place])   . Dyspnea   . GERD (gastroesophageal reflux disease)   . H/O tricuspid valve repair 2012  . History of echocardiogram    Echo at [facility] 4/12: mod LVH, EF 60-65%, mod LAE, tissue MVR ok, mod TR, mod pulmo HTN with RVSP 48 mmHg  . Hypertension   . Obstructive sleep apnea   . Paroxysmal atrial fibrillation (HCC)    s/p Maze procedure at time of MVR in 2011  . Pulmonary HTN ([place])     . 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 Thrombocytopenia [facility])     Patient Active Problem List   Diagnosis Date Noted  . Pleural effusion   . SIRS (systemic inflammatory response syndrome) ([place]) 12/01/2017  . ESRD (end stage renal disease) ([place]) 12/01/2017  . CAD (coronary artery disease) 11/24/2017  . Hyperkalemia 11/24/2017  . Fluid overload 11/24/2017  . Elevated troponin 11/24/2017  . Dyspnea 09/18/2017  . Chronic renal failure   . Advance care planning   . Pulmonary hypertension, unspecified ([place])   . Chest pain   . Biventricular heart failure ([place]) 08/10/2017  . Syncope and collapse 08/10/2017  . Cardiac arrest ([place])   . Benign neoplasm of ascending colon   . Rectal bleeding   . ESRD (end stage renal disease) on dialysis ([place])   . Goals of care, counseling/discussion   . Palliative care by specialist   . Acute kidney injury ([place]) 06/25/2017  . Anasarca 05/30/2017  . HLD (hyperlipidemia) 10/05/2016  . Mouth bleeding 10/05/2016  . Bilateral knee pain 10/05/2016  . Petechial rash 10/05/2016  . Laceration of tongue   . Insomnia 06/10/2016  . Supratherapeutic INR 06/01/2016  . Acute hepatic encephalopathy 03/15/2016  . Liver cirrhosis ([place]) 03/15/2016  . Volume overload 03/15/2016  . Acute congestive heart failure (  [place])   . Insulin dependent diabetes mellitus ([place])   . Acute renal failure (ARF) ([place]) 03/14/2016  . GERD (gastroesophageal reflux disease) 03/04/2016  . Aortic regurgitation 02/27/2016  . Viral gastroenteritis 02/27/2016  . A-fib ([place]) 02/26/2016  . Abdominal pain 02/25/2016  . Diabetes mellitus with complication ([place]) 78/29/5621  . Permanent atrial fibrillation ([place]) 02/25/2016  . Congestive heart failure (CHF) ([place]) 02/25/2016  . Anemia, chronic disease 02/25/2016  . Atrial fibrillation with RVR ([place]) 02/19/2016  . Pain in joint, ankle and foot 01/10/2016  . Asthma   . Acute hypoxemic respiratory failure ([place]) 12/29/2015  .  Acute respiratory failure ([place]) 12/29/2015  . Chronic atrial fibrillation ([place]) 12/24/2015  . Dilated cardiomyopathy ([place]) 12/24/2015  . Polypharmacy 11/23/2015  . Gout 10/23/2015  . Encounter for therapeutic drug monitoring 10/19/2015  . Anemia - mild exacerbation 10/15/2015  . Tricuspid valve disease 10/09/2015  . Mitral valve disease   . Calf pain 09/10/2015  . Type II diabetes mellitus with renal manifestations ([place]) 09/10/2015  . Essential hypertension 09/10/2015    Past Surgical History:  Procedure Laterality Date  . AV FISTULA PLACEMENT Left 07/13/2017   Procedure: LEFT ARM ARTERIOVENOUS (AV) FISTULA CREATION;  Surgeon: Waynetta Sandy, MD;  Location: [place];  Service: Vascular;  Laterality: Left;  . BASCILIC VEIN TRANSPOSITION Left 11/03/2017   Procedure: BASILIC VEIN TRANSPOSITION SECOND STAGE;  Surgeon: Waynetta Sandy, MD;  Location: [place];  Service: Vascular;  Laterality: Left;  . CARDIAC SURGERY    . CARDIAC VALVE REPLACEMENT  2011  . CARDIOVERSION N/A 07/09/2017   Procedure: CARDIOVERSION;  Surgeon: Larey Dresser, MD;  Location[facility] ENDOSCOPY;  Service: Cardiovascular;  Laterality: N/A;  . COLONOSCOPY N/A 07/16/2017   Procedure: COLONOSCOPY;  Surgeon: Jerene Bears, MD;  Location: [place];  Service: Gastroenterology;  Laterality: N/A;  . ESOPHAGOGASTRODUODENOSCOPY N/A 07/16/2017   Procedure: ESOPHAGOGASTRODUODENOSCOPY (EGD);  Surgeon: Jerene Bears, MD;  Location[facility] ENDOSCOPY;  Service: Gastroenterology;  Laterality: N/A;  . INSERTION OF DIALYSIS CATHETER Right 07/13/2017   Procedure: INSERTION OF DIALYSIS CATHETER RIGHT INTERNAL JUGULAR;  Surgeon: Waynetta Sandy, MD;  Location: [place];  Service: Vascular;  Laterality: Right;  . RIGHT HEART CATH N/A 06/29/2017   Procedure: RIGHT HEART CATH;  Surgeon: Larey Dresser, MD;  Location: [place] CV LAB;  Service: Cardiovascular;  Laterality: N/A;  . RIGHT/LEFT HEART CATH AND CORONARY  ANGIOGRAPHY N/A 08/19/2017   Procedure: RIGHT/LEFT HEART CATH AND CORONARY ANGIOGRAPHY;  Surgeon: Leonie Man, MD;  Location: [place] CV LAB;  Service: Cardiovascular;  Laterality: N/A;  . TEE WITHOUT CARDIOVERSION N/A 07/09/2017   Procedure: TRANSESOPHAGEAL ECHOCARDIOGRAM (TEE);  Surgeon: Larey Dresser, MD;  Location: [facility] ENDOSCOPY;  Service: Cardiovascular;  Laterality: N/A;        Home Medications    Prior to Admission medications   Medication Sig Start Date End Date Taking? Authorizing Provider  acetaminophen (TYLENOL) 325 MG tablet Take 650 mg by mouth every 6 (six) hours.     [provider]  allopurinol (ZYLOPRIM) 300 MG tablet Take 300 mg by mouth as directed.    [provider]  amiodarone (PACERONE) 200 MG tablet TAKE 1 TABLET BY MOUTH TWICE A DAY 07/14/18   Fay Records, MD  atorvastatin (LIPITOR) 20 MG tablet TAKE 1 TABLET (20 MG TOTAL) BY MOUTH DAILY. PLEASE KEEP UPCOMING APPT. [place] YOU 06/07/18   Fay Records, MD  calcium acetate (PHOSLO) 667 MG capsule Take 1  capsule (667 mg total) by mouth 3 (three) times daily with meals. Please keep upcoming appt. Thank You 03/09/18   Fay Records, MD  colchicine 0.6 MG tablet Take 0.5 tablets (0.3 mg total) by mouth 2 (two) times a week. Patient not taking: Reported on 08/05/2018 09/21/17   Tawny Asal, MD  fluticasone [facility]) 50 MCG/ACT nasal spray Place 2 sprays into both nostrils daily. 12/14/17   Bonnielee Haff, MD  folic acid (FOLVITE) 1 MG tablet TAKE 1 TABLET BY MOUTH EVERY DAY 03/03/18   Charlott Rakes, MD  gabapentin (NEURONTIN) 100 MG capsule Take 100 mg by mouth as directed.    [provider]  hydrOXYzine (ATARAX/VISTARIL) 25 MG tablet 1/2-1 tablets tid prn itching 12/24/17   Charlott Rakes, MD  metoprolol tartrate (LOPRESSOR) 25 MG tablet Take 1 tablet (25 mg total) by mouth 2 (two) times daily. 07/22/18   Daune Perch, NP  midodrine (PROAMATINE) 5 MG tablet Take 5 mg by mouth as  directed.    [provider]  multivitamin (RENA-VIT) TABS tablet Take 1 tablet by mouth at bedtime. 12/14/17   Bonnielee Haff, MD  omeprazole (PRILOSEC) 20 MG capsule Take 1 capsule (20 mg total) by mouth daily. 03/03/17   Charlott Rakes, MD  oxyCODONE (OXY IR/ROXICODONE) 5 MG immediate release tablet Take 5 mg by mouth every 8 (eight) hours as needed for pain.    [provider]  OXYGEN Inhale 2 L into the lungs daily as needed.     [provider]  tiZANidine (ZANAFLEX) 4 MG tablet Take 1 tablet (4 mg total) by mouth every 8 (eight) hours as needed for muscle spasms. 12/24/17   Charlott Rakes, MD  traMADol (ULTRAM) 50 MG tablet Take 1 tablet (50 mg total) by mouth every 12 (twelve) hours as needed. 06/09/18   Couture, Cortni S, PA-C  warfarin (COUMADIN) 2.5 MG tablet Take 1.5 tablets daily or as directed by Coumadin clinic 08/06/18   Barrett, Evelene Croon, PA-C    Family History Family History  Problem Relation Age of Onset  . Heart attack Neg Hx     Social History Social History   Tobacco Use  . Smoking status: Former Smoker    Packs/day: 1.00    Types: Cigarettes  . Smokeless tobacco: Never Used  Substance Use Topics  . Alcohol use: No  . Drug use: No     Allergies   Patient has no known allergies.   Review of Systems Review of Systems  Unable to perform ROS: Mental status change     Physical Exam Updated Vital Signs BP (!) 86/63   Pulse 78   Resp 13   SpO2 97%   Physical Exam  Constitutional: He is oriented to person, place, and time.  Ill-appearing  HENT:  Head: Normocephalic and atraumatic.  Eyes: EOM are normal.  Neck: Neck supple.  Cardiovascular:  Tachycardia.  Irregularly irregular  Pulmonary/Chest:  Mild tachypnea  Abdominal: Soft. He exhibits no distension. There is no tenderness.  Musculoskeletal: He exhibits no edema.  Petechial but chronic appearing rash of his bilateral hands and legs  Neurological: He is alert and  oriented to person, place, and time.  Skin: Skin is warm.  Psychiatric:  Unable to test  Nursing note and vitals reviewed.    ED Treatments / Results  Labs (all labs ordered are listed, but only abnormal results are displayed) Labs Reviewed  COMPREHENSIVE METABOLIC PANEL - Abnormal; Notable for the following components:  Result Value   Sodium 134 (*)    Potassium >7.5 (*)    Chloride 97 (*)    Glucose, Bld 102 (*)    BUN 76 (*)    Creatinine, Ser 8.05 (*)    Albumin 3.4 (*)    Total Bilirubin 1.8 (*)    GFR calc non Af Amer 7 (*)    GFR calc Af Amer 8 (*)    All other components within normal limits  CBC WITH DIFFERENTIAL/PLATELET - Abnormal; Notable for the following components:   RBC 3.88 (*)    Hemoglobin 10.9 (*)    HCT 34.0 (*)    RDW 15.9 (*)    Platelets 118 (*)    All other components within normal limits  PROTIME-INR - Abnormal; Notable for the following components:   Prothrombin Time 40.5 (*)    INR 4.24 (*)    All other components within normal limits  I-STAT CG4 LACTIC ACID, ED - Abnormal; Notable for the following components:   Lactic Acid, Venous 2.83 (*)    All other components within normal limits  I-STAT CG4 LACTIC ACID, ED - Abnormal; Notable for the following components:   Lactic Acid, Venous 2.45 (*)    All other components within normal limits  I-STAT ARTERIAL BLOOD GAS, ED - Abnormal; Notable for the following components:   pO2, Arterial 460.0 (*)    All other components within normal limits  CULTURE, BLOOD (ROUTINE X 2)  CULTURE, BLOOD (ROUTINE X 2)  URINALYSIS, ROUTINE W REFLEX MICROSCOPIC  AMMONIA  RAPID URINE DRUG SCREEN, HOSP PERFORMED  I-STAT TROPONIN, ED  CBG MONITORING, ED  TYPE AND SCREEN    EKG EKG Interpretation  Date/Time:  Friday August 06 2018 15:40:47 EDT Ventricular Rate:  73 PR Interval:    QRS Duration: 158 QT Interval:  552 QTC Calculation: 609 R Axis:   127 Text Interpretation:  Atrial fibrillation Right  bundle branch block No significant change was found Confirmed by Jola Schmidt [phone]) on 08/06/2018 4:59:39 PM   Radiology Dg Chest 2 View  Result Date: 08/06/2018 CLINICAL DATA:  Hypotension. EXAM: CHEST - 2 VIEW COMPARISON:  Radiograph of December 13, 2017. FINDINGS: Stable cardiomegaly. No pneumothorax is noted. Left lung is clear. Moderate right pleural effusion is noted with probable underlying atelectasis or infiltrate. Sternotomy wires are noted. Bony thorax is unremarkable. IMPRESSION: Moderate right pleural effusion with probable underlying atelectasis or infiltrate. Electronically Signed   By: Marijo Conception, M.D.   On: 08/06/2018 14:44   Dg Chest Portable 1 View  Result Date: 08/06/2018 CLINICAL DATA:  Encounter for endotracheal tube placement. EXAM: PORTABLE CHEST 1 VIEW COMPARISON:  Two-view chest x-ray 08/06/2018 at 2:35 p.m. FINDINGS: The patient is now intubated. Endotracheal tube terminates 3.5 cm above the carina. An NG tube has been placed. It courses off the inferior border of the film. The heart is enlarged. Atherosclerotic calcifications are present at the aortic arch. Right lower lobe airspace disease and effusion are again noted. The left lung is clear. IMPRESSION: 1. Satisfactory positioning endotracheal tube. 2. NG tube courses off the inferior border of the film. 3. Persistent right lower lobe airspace disease concerning for pneumonia. 4. Right pleural effusion. Electronically Signed   By: [place] Morelle M.D.   On: 08/06/2018 16:41    Procedures Procedure Name: Intubation Date/Time: 08/06/2018 4:38 PM Performed by: Jola Schmidt, MD Pre-anesthesia Checklist: Patient identified, Patient being monitored, Emergency Drugs available, Timeout performed and Suction available Oxygen Delivery Method: Non-rebreather  mask Preoxygenation: Pre-oxygenation with 100% oxygen Induction Type: Rapid sequence Ventilation: Mask ventilation without difficulty Laryngoscope Size:  Glidescope Grade View: Grade I Tube size: 7.5 mm Number of attempts: 1 Airway Equipment and Method: Video-laryngoscopy Placement Confirmation: ETT inserted through vocal cords under direct vision,  CO2 detector and Breath sounds checked- equal and bilateral Secured at: 23 cm Tube secured with: ETT holder    .Critical Care Performed by: Jola Schmidt, MD Authorized by: Jola Schmidt, MD    CRITICAL CARE Performed by: Jola Schmidt Total critical care time: 75 minutes Critical care time was exclusive of separately billable procedures and treating other patients. Critical care was necessary to treat or prevent imminent or life-threatening deterioration. Critical care was time spent personally by me on the following activities: development of treatment plan with patient and/or surrogate as well as nursing, discussions with consultants, evaluation of patient's response to treatment, examination of patient, obtaining history from patient or surrogate, ordering and performing treatments and interventions, ordering and review of laboratory studies, ordering and review of radiographic studies, pulse oximetry and re-evaluation of patient's condition.    Angiocath insertion Performed by: Jola Schmidt Consent: Verbal consent obtained. Risks and benefits: risks, benefits and alternatives were discussed Time out: Immediately prior to procedure a "time out" was called to verify the correct patient, procedure, equipment, support staff and site/side marked as required. Preparation: Patient was prepped and draped in the usual sterile fashion. Vein Location: left external jugular vein Gauge: 18 Normal blood return and flush without difficulty Patient tolerance: Patient tolerated the procedure well with no immediate complications.   Angiocath insertion Performed by: Jola Schmidt  Consent: Verbal consent obtained. Risks and benefits: risks, benefits and alternatives were discussed Time out:  Immediately prior to procedure a "time out" was called to verify the correct patient, procedure, equipment, support staff and site/side marked as required. Preparation: Patient was prepped and draped in the usual sterile fashion. Vein Location: right external jugular Gauge: 18 Normal blood return and flush without difficulty Patient tolerance: Patient tolerated the procedure well with no immediate complications.      Medications Ordered in ED Medications  ceFEPIme (MAXIPIME) 2 g in sodium chloride 0.9 % 100 mL IVPB (2 g Intravenous New Bag/Given 08/06/18 1700)  metroNIDAZOLE (FLAGYL) IVPB 500 mg (has no administration in time range)  sodium chloride 0.9 % bolus 1,500 mL (1,500 mLs Intravenous New Bag/Given 08/06/18 1653)  vancomycin (VANCOCIN) 1,500 mg in sodium chloride 0.9 % 500 mL IVPB (has no administration in time range)  norepinephrine (LEVOPHED) 4-5 MG/250ML-% infusion SOLN (has no administration in time range)  sodium chloride 0.9 % bolus 1,500 mL (0 mLs Intravenous Stopped 08/06/18 1612)  calcium chloride 1 g in sodium chloride 0.9 % 100 mL IVPB (1 g Intravenous New Bag/Given 08/06/18 1644)  albuterol (PROVENTIL) (2.5 MG/3ML) 0.083% nebulizer solution 10 mg (10 mg Nebulization Given 08/06/18 1652)  insulin aspart (novoLOG) injection 5 Units (5 Units Intravenous Given 08/06/18 1700)  etomidate (AMIDATE) injection (20 mg Intravenous Given 08/06/18 1606)  succinylcholine (ANECTINE) injection (100 mg Intravenous Given 08/06/18 1606)  fentaNYL (SUBLIMAZE) injection 50 mcg (50 mcg Intravenous Given 08/06/18 1618)  midazolam (VERSED) injection 2 mg (2 mg Intravenous Given 08/06/18 1619)  norepinephrine (LEVOPHED) 4mg  in D5W 261mL premix infusion (10 mcg/min Intravenous Rate/Dose Change 08/06/18 1702)  PHENYLephrine 40 mcg/ml in normal saline Adult IV Push Syringe (200 mcg Intravenous Given 08/06/18 1627)  dextrose 50 % solution 50 mL (50 mLs Intravenous Given 08/06/18 1637)  Initial  Impression / Assessment and Plan / ED Course  I have reviewed the triage vital signs and the nursing notes.  Pertinent labs & imaging results that were available during my care of the patient were reviewed by me and considered in my medical decision making (see chart for details).     Patient with worsening respiratory status while in the emergency department despite nonrebreather mask.  Mental status worsening as well.  May represent hypercarbia as the patient does have a history of obstructive sleep apnea.  Hepatic encephalopathy is still a possibility as well.  Ammonia level is pending.  Right lower lobe noted on chest x-ray.  Broad-spectrum antibiotics.  Sepsis code.  Blood cultures obtained.  Profound hyperkalemia with some degree of volume overload as well.  Temporized with calcium bicarb bronchodilators insulin and dextrose.  Will discuss case with nephrology for possible CVHD in the intensive care unit.  Case will be gust with PCCM regarding placement in the intensive care unit.  Patient will be initiated on levo fed now as he remains hypotensive post intubation despite IV resuscitation.  Started on levo fed for persistent hypotension despite IV push doses of phenylephrine and aggressive IV fluids.  Consults: Dr Meredeth Ide (nephrology) PCCM  Dispo: ICU  Final Clinical Impressions(s) / ED Diagnoses   Final diagnoses:  Septic shock ([place])  Acute respiratory failure, unspecified whether with hypoxia or hypercapnia ([place])  HCAP (healthcare-associated pneumonia)  Hyperkalemia  ESRD (end stage renal disease) on dialysis [facility])    ED Discharge Orders    None       Jola Schmidt, MD 08/06/18 [place], MD 08/06/18 1746

## 2018-08-06 NOTE — H&P (Addendum)
NAMETorres [name], MRN:  [id number], DOB:  January 22, 1961, LOS: 0 ADMISSION DATE:  08/06/2018, CONSULTATION DATE:  08/06/2016 REFERRING MD:  Dr. Venora Maples, CHIEF COMPLAINT:  AMS  Brief History   24 yoM presenting from dialysis with AMS and hypotension found to be hypothermic, hypotensive, and lethargic with hyperkalemia >7.5. Last dialysis 9/18.  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  CXR shows RLL inflitrate.  Intubated for desaturing and airway protection in ER.  Chemically treated for hyperkalemia and receiving 67ml/ kg NS bolus.  INR 4.24.    Significant Hospital Events   08/06/2018 Admit/ intubated  Consults: date of consult/date signed off & final recs:  Nephrology 9/20   Procedures (surgical and bedside):  08/06/2018 ETT >>  Significant Diagnostic Tests:   Micro Data: 9/20 BC x 2 >> 9/20 Trach asp >> 9/20 MRSA PCR >>  Antimicrobials:  9/20 vanc >> 9/20 cefepime >> 9/20 flagyl >>  Subjective:   Objective   Blood pressure 107/81, pulse 70, resp. rate 18, SpO2 93 %.    Vent Mode: PRVC FiO2 (%):  [75 %-100 %] 75 % Set Rate:  [18 bmp] 18 bmp Vt Set:  [500 mL] 500 mL PEEP:  [8 cmH20-12 cmH20] 8 cmH20   Intake/Output Summary (Last 24 hours) at 08/06/2018 1728 Last data filed at 08/06/2018 1612 Gross per 24 hour  Intake 1500 ml  Output -  Net 1500 ml   Filed Weights    Examination: General:  Critically ill adult male in NAD, sedated on MV HEENT: MM pink/moist, pupils 3/reactive, ETT 7.5 at 24 cm, OGT, bilateral EJs Neuro: sedated, no response CV: IRIR 60's, +1 distal pulses, AVF RUE PULM: even/non-labored on MV, rales R base, rhonchi right anterior, left clear GI: soft, bs active  Extremities: cool/dry, no LE edema Skin: purpuric rash to BLE and R hand as pic below       Resolved Hospital Problem list    Assessment & Plan:    Shock- likely septic related RLL, ddx include cardiogenic given PMH P:  Admit to ICU Continue remainder of 96ml/kg NS bolus now (prior TTE 11/2017 with EF 50-55%) Peripheral levophed for now given INR 4.24, ideally would avoid CVL/ HD placement for now Trend lactate Assess PCT Follow BC, send trach asp, and for urine strep pna if able get urine Send for UC if able Continue vanc, cefepime, and flagyl  TTE ordered  Resume PO home midodrine   Acute respiratory insufficiency in the setting of acute encephalopathy, RLL, and probable right small effusion P:  Full MV support, PRVC, rate 18 Wean FiO2/ peep as able CXR / ABG in am VAP bundle duoneb prn  Follow sputum cx/ abx as above  Hyperkalemia w/ ESRD - M/W/F, last treatment Wednesday - acute EKG changes s/p chemical dialysis in ER P:  Nephrology consulted Repeat istat now and renal panel at 2100 Monitor for EKG changes Supratherapeutic INR (4.24) prevents temporary HD catheter at this time Pending FFP transfusion Will need to attempt to reach family for consent  Acute encephalopathy - likely related to sepsis - possible r/t substance abuse given hx  P:  Defer head CT, as reported non-focal exam per EDP prior to intubation  Frequent neuro checks PAD protocol with prn fentanyl/ versed, RASS goal 0/-1 UDS pending   Supra therapeutic INR on coumadin - Hgb at baseline P:  FFP ordered  INR after FFP  transfused Hold coumadin  Purpuric Rash - stable thrombocytopenia - outpt workup w/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 Attempted to call daughter listed as NOK, no answer  Labs   CBC: Recent Labs  Lab 08/06/18 1408  WBC 6.5  NEUTROABS 4.9  HGB 10.9*  HCT 34.0*  MCV 87.6  PLT 308*   Basic Metabolic Panel: Recent Labs  Lab 08/05/18 1144 08/06/18 1408  NA 141 134*  K 5.4* >7.5*  CL 97 97*  CO2 24 23  GLUCOSE 190* 102*  BUN 58* 76*  CREATININE 6.25* 8.05*  CALCIUM 9.6 9.1   GFR: Estimated Creatinine Clearance: 8.7 mL/min (A) (by C-G formula based on SCr of 8.05 mg/dL (H)). Recent Labs  Lab 08/06/18 1408 08/06/18 1431 08/06/18 1535  WBC 6.5  --   --   LATICACIDVEN  --  2.83* 2.45*   Liver Function Tests: Recent Labs  Lab 08/05/18 1144 08/06/18 1408  AST 24 30  ALT 14 16  ALKPHOS 163* 120  BILITOT 1.0 1.8*  PROT 8.6* 7.6  ALBUMIN 4.2 3.4*   No results for input(s): LIPASE, AMYLASE in the last 168 hours. Recent Labs  Lab 08/06/18 1600  AMMONIA 43*   ABG    Component Value Date/Time   PHART 7.426 08/06/2018 1641   PCO2ART 36.6 08/06/2018 1641   PO2ART 460.0 (H) 08/06/2018 1641   HCO3 24.3 08/06/2018 1641   TCO2 25 08/06/2018 1641   ACIDBASEDEF 7.0 (H) 03/15/2016 0028   O2SAT 100.0 08/06/2018 1641    Coagulation Profile: Recent Labs  Lab 08/05/18 1139 08/06/18 1408  INR 3.9* 4.24*   Cardiac Enzymes: No results for input(s): CKTOTAL, CKMB, CKMBINDEX, TROPONINI in the last 168 hours. HbA1C: Hemoglobin A1C  Date/Time Value Ref Range Status  09/01/2017 09:22 AM 5.2  Final   Hgb A1c MFr Bld  Date/Time Value Ref Range Status  11/24/2017 05:21 AM 4.9 4.8 - 5.6 % Final    Comment:    (NOTE) Pre diabetes:          5.7%-6.4% Diabetes:              >6.4% Glycemic control for   <7.0% adults with diabetes   05/30/2017 03:38 PM 5.5 4.8 - 5.6 % Final    Comment:    (NOTE)          Pre-diabetes: 5.7 - 6.4         Diabetes: >6.4         Glycemic control for adults with diabetes: <7.0    CBG: Recent Labs  Lab 08/06/18 1632  GLUCAP 79    Admitting History of Present Illness.   HPI obtained from medical chart review and with talking to nephrology as patient is intubated and sedated on mechanical ventilation.  No family present.   28 yoM, who speaks little [place], with extensive PMH significant for but not limited to ESRD on  MWF HD, combined HF, CAD, DM, cirrhosis, GERD, HTN, PAF, pulmonary hypertension, s/p MVR on coumadin, and substance abuse, who was sent from HD with hypotension and lethargy.  Of note, recently developed purpuric rash to lower extremities which has been worked up as outpatient with biopsy reportedly showing leukocytoclastic vasculitis with negative ANCA and immunology workup.  Additionally, patient recently tested positive for methamphetamines at dialysis center.  Last dialysis was 08/04/2018.  On arrival to ER, patient with continued lethargy, hypothermic, and hypotensive.  Labs significant for K > 7.5 with EKG changes which was chemically treated.  INR 4.24  Patient intubated for desaturations and airway protection. CXR notes for RLL infiltrate.  Started on vancomycin, cefepime, and flagyl after blood cultures sent.  Receiving remainder of NS 30 ml/kg bolus now. Nephrology consulted by ER.  PCCM to admit.   Review of Systems:   Unable to obtain as patient is intubated and sedated on MV.   Past medical history  He,  has a past medical history of Anemia of chronic disease, Asthma, Chronic kidney disease (CKD), stage IV (severe) ([place]), Chronic systolic CHF (congestive heart failure) ([place]), Cirrhosis ([place]), Coronary artery disease, Diabetes mellitus type 2 in obese ([place]), Diabetes mellitus without complication ([place]), Dyspnea, GERD (gastroesophageal reflux disease), H/O tricuspid valve repair (2012), History of echocardiogram, Hypertension, Obstructive  sleep apnea, Paroxysmal atrial fibrillation ([place]), Pulmonary HTN ([place]), S/P mitral valve replacement with porcine valve (2012), and Thrombocytopenia ([place]).   Surgical History    Past Surgical History:  Procedure Laterality Date  . AV FISTULA PLACEMENT Left 07/13/2017   Procedure: LEFT ARM ARTERIOVENOUS (AV) FISTULA CREATION;  Surgeon: Waynetta Sandy, MD;  Location: [place];  Service: Vascular;  Laterality: Left;  . BASCILIC VEIN TRANSPOSITION Left 11/03/2017   Procedure: BASILIC VEIN TRANSPOSITION SECOND STAGE;  Surgeon: Waynetta Sandy, MD;  Location: [place];  Service: Vascular;  Laterality: Left;  . CARDIAC SURGERY    . CARDIAC VALVE REPLACEMENT  2011  . CARDIOVERSION N/A 07/09/2017   Procedure: CARDIOVERSION;  Surgeon: Larey Dresser, MD;  [facility] ENDOSCOPY;  Service: Cardiovascular;  Laterality: N/A;  . COLONOSCOPY N/A 07/16/2017   Procedure: COLONOSCOPY;  Surgeon: Jerene Bears, MD;  Location: [place];  Service: Gastroenterology;  Laterality: N/A;  . ESOPHAGOGASTRODUODENOSCOPY N/A 07/16/2017   Procedure: ESOPHAGOGASTRODUODENOSCOPY (EGD);  Surgeon: Jerene Bears, MD;  Location: [place] ENDOSCOPY;  Service: Gastroenterology;  Laterality: N/A;  . INSERTION OF DIALYSIS CATHETER Right 07/13/2017   Procedure: INSERTION OF DIALYSIS CATHETER RIGHT INTERNAL JUGULAR;  Surgeon: Waynetta Sandy, MD;  Location: [place];  Service: Vascular;  Laterality: Right;  . RIGHT HEART CATH N/A 06/29/2017   Procedure: RIGHT HEART CATH;  Surgeon: Larey Dresser, MD;  Location: [place] CV LAB;  Service: Cardiovascular;  Laterality: N/A;  . RIGHT/LEFT HEART CATH AND CORONARY ANGIOGRAPHY N/A 08/19/2017   Procedure: RIGHT/LEFT HEART CATH AND CORONARY ANGIOGRAPHY;  Surgeon: Leonie Man, MD;  Location: [place] CV LAB;  Service: Cardiovascular;  Laterality: N/A;  . TEE WITHOUT CARDIOVERSION N/A 07/09/2017   Procedure: TRANSESOPHAGEAL ECHOCARDIOGRAM (TEE);  Surgeon: Larey Dresser, MD;  Location: [facility] ENDOSCOPY;  Service: Cardiovascular;  Laterality: N/A;     Social History   Social History   Socioeconomic History  . Marital status: Single    Spouse name: Not on file  . Number of children: Not on file  . Years of education: Not on file  . Highest education level: Not on file  Occupational History  Comment: on disability  Social Needs  . Financial resource strain: Not on file  . Food insecurity:    Worry: Not on file    Inability: Not on file  . Transportation needs:    Medical: Not on file    Non-medical: Not on file  Tobacco Use  . Smoking status: Former Smoker    Packs/day: 1.00    Types: Cigarettes  . Smokeless tobacco: Never Used  Substance and Sexual Activity  . Alcohol use: No  . Drug use: No  . Sexual activity: Not Currently  Lifestyle  . Physical activity:    Days per week: Not on file    Minutes per session: Not on file  . Stress: Not on file  Relationships  . Social connections:    Talks on phone: Not on file    Gets together: Not on file    Attends religious service: Not on file    Active member of club or organization: Not on file    Attends meetings of clubs or organizations: Not on file    Relationship status: Not on file  . Intimate partner violence:    Fear of current or ex partner: Not on file    Emotionally abused: Not on file    Physically abused: Not on file    Forced sexual activity: Not on file  Other Topics Concern  . Not on file  Social History Narrative  . Not on file  ,  reports that he has quit smoking. His smoking use included cigarettes. He smoked 1.00 pack per day. He has never used smokeless tobacco. He reports that he does not drink alcohol or use drugs.   Family history   His family history is negative for Heart attack.   Allergies No Known Allergies  Home meds  Prior to Admission medications   Medication Sig Start Date End Date Taking? Authorizing Provider  acetaminophen (TYLENOL) 325 MG tablet  Take 650 mg by mouth every 6 (six) hours.     [provider]  allopurinol (ZYLOPRIM) 300 MG tablet Take 300 mg by mouth as directed.    [provider]  amiodarone (PACERONE) 200 MG tablet TAKE 1 TABLET BY MOUTH TWICE A DAY 07/14/18   Fay Records, MD  atorvastatin (LIPITOR) 20 MG tablet TAKE 1 TABLET (20 MG TOTAL) BY MOUTH DAILY. PLEASE KEEP UPCOMING APPT. [place] YOU 06/07/18   Fay Records, MD  calcium acetate (PHOSLO) 667 MG capsule Take 1 capsule (667 mg total) by mouth 3 (three) times daily with meals. Please keep upcoming appt. Thank You 03/09/18   Fay Records, MD  colchicine 0.6 MG tablet Take 0.5 tablets (0.3 mg total) by mouth 2 (two) times a week. Patient not taking: Reported on 08/05/2018 09/21/17   Tawny Asal, MD  fluticasone [facility]) 50 MCG/ACT nasal spray Place 2 sprays into both nostrils daily. 12/14/17   Bonnielee Haff, MD  folic acid (FOLVITE) 1 MG tablet TAKE 1 TABLET BY MOUTH EVERY DAY 03/03/18   Charlott Rakes, MD  gabapentin (NEURONTIN) 100 MG capsule Take 100 mg by mouth as directed.    [provider]  hydrOXYzine (ATARAX/VISTARIL) 25 MG tablet 1/2-1 tablets tid prn itching 12/24/17   Charlott Rakes, MD  metoprolol tartrate (LOPRESSOR) 25 MG tablet Take 1 tablet (25 mg total) by mouth 2 (two) times daily. 07/22/18   Daune Perch, NP  midodrine (PROAMATINE) 5 MG tablet Take 5 mg by mouth as directed.    [provider]  multivitamin (RENA-VIT) TABS tablet Take 1 tablet by mouth at bedtime. 12/14/17   Bonnielee Haff, MD  omeprazole (PRILOSEC) 20 MG capsule Take 1 capsule (20 mg total) by mouth daily. 03/03/17   Charlott Rakes, MD  oxyCODONE (OXY IR/ROXICODONE) 5 MG immediate release tablet Take 5 mg by mouth every 8 (eight) hours as needed for pain.    [provider]  OXYGEN Inhale 2 L into the lungs daily as needed.     [provider]  tiZANidine (ZANAFLEX) 4 MG tablet Take 1 tablet (4 mg total) by mouth every 8 (eight)  hours as needed for muscle spasms. 12/24/17   Charlott Rakes, MD  traMADol (ULTRAM) 50 MG tablet Take 1 tablet (50 mg total) by mouth every 12 (twelve) hours as needed. 06/09/18   Couture, Cortni S, PA-C  warfarin (COUMADIN) 2.5 MG tablet Take 1.5 tablets daily or as directed by Coumadin clinic 08/06/18   Barrett, Evelene Croon, PA-C     CCT 60 mins  Kennieth Rad, AGACNP-BC [facility] Pgr: [phone] or if no answer [phone] 08/06/2018, 6:26 PM

## 2018-08-06 NOTE — Progress Notes (Signed)
RT NOTES: ABG results given to Dr Venora Maples. Fio2 decreased to 75% and PEEP decreased to +8.

## 2018-08-06 NOTE — Procedures (Signed)
Hemodialysis Catheter Insertion Procedure Note Michael [name] [id number] 17-Mar-1961  Procedure: Insertion of Hemodialysis Catheter Indications: Hemodialysis  Procedure Details Consent: Unable to obtain consent because of emergent medical necessity.  Time Out: Verified patient identification, verified procedure, site/side was marked, verified correct patient position, special equipment/implants available, medications/allergies/relevent history reviewed, required imaging and test results available.  Performed  Maximum sterile technique was used including antiseptics, cap, gloves, gown, hand hygiene, mask and sheet.  Skin prep: Chlorhexidine; local anesthetic administered  A Trialysis HD catheter was placed in the right internal jugular vein using the Seldinger technique.  Evaluation Blood flow good Complications: No apparent complications Patient did tolerate procedure well. Chest X-ray ordered to verify placement.  CXR: normal. Procedure performed with ultrasound guidance for real time vessel cannulation.     Hayden Pedro, AG-ACNP [facility]  Pgr: [phone]  PCCM Pgr: [phone]

## 2018-08-06 NOTE — ED Triage Notes (Signed)
Pt lives at home. He has HD on MWF.  This am he went to HD and they called EMS and did not perform HD due to pt being hypotensive and lethargic.  Pt had a BP of 70/50 with a MAP of 60.  Pt was able to stand with assisst for ems.  No acute distress.  EKG 2nd degree II, CBG 100

## 2018-08-06 NOTE — Progress Notes (Signed)
RT NOTES: Pt transported to 56M11 on vent without incident. Report given to 56M RT.

## 2018-08-07 ENCOUNTER — Inpatient Hospital Stay (HOSPITAL_COMMUNITY): Payer: Medicare Other

## 2018-08-07 ENCOUNTER — Encounter: Payer: Self-pay | Admitting: Cardiology

## 2018-08-07 LAB — BLOOD GAS, ARTERIAL
Acid-base deficit: 3.4 mmol/L — ABNORMAL HIGH (ref 0.0–2.0)
Bicarbonate: 20.7 mmol/L (ref 20.0–28.0)
DRAWN BY: 414221
FIO2: 60
MECHVT: 500 mL
O2 Saturation: 99.8 %
PEEP: 8 cmH2O
PO2 ART: 222 mmHg — AB (ref 83.0–108.0)
Patient temperature: 98
RATE: 18 resp/min
pCO2 arterial: 34 mmHg (ref 32.0–48.0)
pH, Arterial: 7.399 (ref 7.350–7.450)

## 2018-08-07 LAB — RENAL FUNCTION PANEL
ALBUMIN: 3.1 g/dL — AB (ref 3.5–5.0)
ALBUMIN: 3.2 g/dL — AB (ref 3.5–5.0)
ALBUMIN: 2.9 g/dL — AB (ref 3.5–5.0)
ANION GAP: 14 (ref 5–15)
ANION GAP: 14 (ref 5–15)
ANION GAP: 14 (ref 5–15)
BUN: 74 mg/dL — ABNORMAL HIGH (ref 6–20)
BUN: 62 mg/dL — AB (ref 6–20)
BUN: 50 mg/dL — AB (ref 6–20)
CALCIUM: 8.5 mg/dL — AB (ref 8.9–10.3)
CHLORIDE: 103 mmol/L (ref 98–111)
CO2: 18 mmol/L — ABNORMAL LOW (ref 22–32)
CO2: 18 mmol/L — ABNORMAL LOW (ref 22–32)
CO2: 21 mmol/L — AB (ref 22–32)
CREATININE: 7.18 mg/dL — AB (ref 0.61–1.24)
Calcium: 8.5 mg/dL — ABNORMAL LOW (ref 8.9–10.3)
Calcium: 8.5 mg/dL — ABNORMAL LOW (ref 8.9–10.3)
Chloride: 106 mmol/L (ref 98–111)
Chloride: 104 mmol/L (ref 98–111)
Creatinine, Ser: 6.04 mg/dL — ABNORMAL HIGH (ref 0.61–1.24)
Creatinine, Ser: 4.38 mg/dL — ABNORMAL HIGH (ref 0.61–1.24)
GFR calc Af Amer: 11 mL/min — ABNORMAL LOW (ref >60)
GFR calc Af Amer: 16 mL/min — ABNORMAL LOW (ref >60)
GFR calc non Af Amer: 9 mL/min — ABNORMAL LOW (ref >60)
GFR calc non Af Amer: 14 mL/min — ABNORMAL LOW (ref >60)
GFR, EST AFRICAN AMERICAN: 9 mL/min — AB (ref >60)
GFR, EST NON AFRICAN AMERICAN: 7 mL/min — AB (ref >60)
GLUCOSE: 100 mg/dL — AB (ref 70–99)
GLUCOSE: 140 mg/dL — AB (ref 70–99)
Glucose, Bld: 88 mg/dL (ref 70–99)
PHOSPHORUS: 4.7 mg/dL — AB (ref 2.5–4.6)
POTASSIUM: 5.1 mmol/L (ref 3.5–5.1)
Phosphorus: 5.4 mg/dL — ABNORMAL HIGH (ref 2.5–4.6)
Phosphorus: 4.9 mg/dL — ABNORMAL HIGH (ref 2.5–4.6)
Potassium: 6.2 mmol/L — ABNORMAL HIGH (ref 3.5–5.1)
Potassium: 5.9 mmol/L — ABNORMAL HIGH (ref 3.5–5.1)
SODIUM: 138 mmol/L (ref 135–145)
SODIUM: 136 mmol/L (ref 135–145)
Sodium: 138 mmol/L (ref 135–145)

## 2018-08-07 LAB — GLUCOSE, CAPILLARY
Glucose-Capillary: 91 mg/dL (ref 70–99)
Glucose-Capillary: 107 mg/dL — ABNORMAL HIGH (ref 70–99)
Glucose-Capillary: 124 mg/dL — ABNORMAL HIGH (ref 70–99)
Glucose-Capillary: 131 mg/dL — ABNORMAL HIGH (ref 70–99)
Glucose-Capillary: 136 mg/dL — ABNORMAL HIGH (ref 70–99)

## 2018-08-07 LAB — FIBRINOGEN: FIBRINOGEN: 393 mg/dL (ref 210–475)

## 2018-08-07 LAB — CBC
HCT: 33 % — ABNORMAL LOW (ref 39.0–52.0)
Hemoglobin: 10.7 g/dL — ABNORMAL LOW (ref 13.0–17.0)
MCH: 28.4 pg (ref 26.0–34.0)
MCHC: 32.4 g/dL (ref 30.0–36.0)
MCV: 87.5 fL (ref 78.0–100.0)
PLATELETS: 167 10*3/uL (ref 150–400)
RBC: 3.77 MIL/uL — ABNORMAL LOW (ref 4.22–5.81)
RDW: 15.9 % — AB (ref 11.5–15.5)
WBC: 10 10*3/uL (ref 4.0–10.5)

## 2018-08-07 LAB — BPAM FFP
Blood Product Expiration Date: 201909252359
ISSUE DATE / TIME: 201909202022
Unit Type and Rh: 7300

## 2018-08-07 LAB — TROPONIN I
TROPONIN I: 0.04 ng/mL — AB (ref <0.03)
Troponin I: 0.06 ng/mL (ref <0.03)
Troponin I: 0.05 ng/mL (ref <0.03)
Troponin I: 0.04 ng/mL (ref <0.03)

## 2018-08-07 LAB — PREPARE FRESH FROZEN PLASMA

## 2018-08-07 LAB — ECHOCARDIOGRAM COMPLETE: WEIGHTICAEL: 2825.42 [oz_av]

## 2018-08-07 LAB — PROTIME-INR
INR: 2.24
PROTHROMBIN TIME: 24.6 s — AB (ref 11.4–15.2)

## 2018-08-07 LAB — PHOSPHORUS
PHOSPHORUS: 4.8 mg/dL — AB (ref 2.5–4.6)
Phosphorus: 4.7 mg/dL — ABNORMAL HIGH (ref 2.5–4.6)

## 2018-08-07 LAB — MAGNESIUM
MAGNESIUM: 2.3 mg/dL (ref 1.7–2.4)
Magnesium: 2.6 mg/dL — ABNORMAL HIGH (ref 1.7–2.4)

## 2018-08-07 MED ORDER — PRO-STAT SUGAR FREE PO LIQD
30.0000 mL | Freq: Two times a day (BID) | ORAL | Status: DC
  Administered 2018-08-07 – 2018-08-08 (×3): 30 mL
  Filled 2018-08-07 (×3): qty 30

## 2018-08-07 MED ORDER — PRISMASOL BGK 0/2.5 32-2.5 MEQ/L IV SOLN
INTRAVENOUS | Status: DC
  Administered 2018-08-07 – 2018-08-08 (×3): via INTRAVENOUS_CENTRAL
  Filled 2018-08-07 (×4): qty 5000

## 2018-08-07 MED ORDER — VITAL HIGH PROTEIN PO LIQD: 1000.0000 mL | ORAL | Status: DC

## 2018-08-07 MED ORDER — PRISMASOL BGK 0/2.5 32-2.5 MEQ/L IV SOLN
INTRAVENOUS | Status: DC
  Administered 2018-08-07 – 2018-08-09 (×9): via INTRAVENOUS_CENTRAL
  Filled 2018-08-07 (×11): qty 5000

## 2018-08-07 MED ORDER — LIDOCAINE-EPINEPHRINE 1 %-1:100000 IJ SOLN
10.0000 mL | Freq: Once | INTRAMUSCULAR | Status: AC
  Administered 2018-08-07: 10 mL via INTRADERMAL
  Filled 2018-08-07: qty 10

## 2018-08-07 MED ORDER — ORAL CARE MOUTH RINSE
15.0000 mL | OROMUCOSAL | Status: DC
  Administered 2018-08-07 – 2018-08-08 (×13): 15 mL via OROMUCOSAL

## 2018-08-07 MED ORDER — VITAMIN K1 10 MG/ML IJ SOLN
2.0000 mg | Freq: Once | INTRAVENOUS | Status: AC
  Administered 2018-08-07: 2 mg via INTRAVENOUS
  Filled 2018-08-07: qty 0.2

## 2018-08-07 MED ORDER — NOREPINEPHRINE 16 MG/250ML-% IV SOLN
0.0000 ug/min | INTRAVENOUS | Status: DC
  Administered 2018-08-07: 9 ug/min via INTRAVENOUS
  Filled 2018-08-07 (×2): qty 250

## 2018-08-07 MED ORDER — CHLORHEXIDINE GLUCONATE 0.12% ORAL RINSE (MEDLINE KIT)
15.0000 mL | Freq: Two times a day (BID) | OROMUCOSAL | Status: DC
  Administered 2018-08-07 – 2018-08-08 (×4): 15 mL via OROMUCOSAL

## 2018-08-07 MED ORDER — SODIUM CHLORIDE 0.9 % IV SOLN
20.0000 ug | Freq: Once | INTRAVENOUS | Status: AC
  Administered 2018-08-07: 20 ug via INTRAVENOUS
  Filled 2018-08-07: qty 5

## 2018-08-07 MED ORDER — VITAL AF 1.2 CAL PO LIQD
1000.0000 mL | ORAL | Status: DC
  Administered 2018-08-07: 1000 mL
  Filled 2018-08-07 (×2): qty 1000

## 2018-08-07 NOTE — Progress Notes (Signed)
[facility] Progress Note Patient Name: Michael [name] DOB: 07/09/1961 MRN: [id number]   Date of Service  08/07/2018  HPI/Events of Note  Hypotension. RN requesting increase in max dose of Norepinephrine  eICU Interventions  Norepinephrine max dose increased to 20 mics        Siara Gorder U Marielena Harvell 08/07/2018, 12:30 AM

## 2018-08-07 NOTE — Progress Notes (Signed)
[facility] Progress Note  Subjective: on CRRT, bleeding from IJ HD cath  Vitals:   08/07/18 0600 08/07/18 0700 08/07/18 0800 08/07/18 0834  BP:      Pulse: 90 98 83   Resp: '18 18 19   ' Temp: (!) 97 F (36.1 C) (!) 96.4 F (35.8 C) (!) 96.3 F (35.7 C)   TempSrc:   Bladder   SpO2: 100% 100% 100% 93%  Weight:        Inpatient medications: . atorvastatin  20 mg Oral Daily  . chlorhexidine gluconate (MEDLINE KIT)  15 mL Mouth Rinse BID  . famotidine  20 mg Per Tube Daily  . hydrocortisone sod succinate (SOLU-CORTEF) inj  50 mg Intravenous Q6H  . insulin aspart  0-15 Units Subcutaneous Q4H  . mouth rinse  15 mL Mouth Rinse 10 times per day  . midodrine  5 mg Per Tube TID WC   . sodium chloride 10 mL/hr at 08/07/18 1000  . ceFEPime (MAXIPIME) IV 2 g (08/07/18 0533)  . heparin 999 mL/hr at 08/06/18 2303  . metronidazole Stopped (08/07/18 5208)  . norepinephrine (LEVOPHED) Adult infusion 8 mcg/min (08/07/18 1100)  . phytonadione (VITAMIN K) IV    . dialysis replacement fluid (prismasate)    . dialysate (PRISMASATE)    . dialysis replacement fluid (prismasate) 300 mL/hr at 08/07/18 0945  . vancomycin     Place/Maintain arterial line **AND** sodium chloride, fentaNYL (SUBLIMAZE) injection, fentaNYL (SUBLIMAZE) injection, heparin, heparin, ipratropium-albuterol, midazolam, midazolam  Iron/TIBC/Ferritin/ %Sat    Component Value Date/Time   IRON 55 07/14/2017 0506   TIBC 392 07/14/2017 0506   FERRITIN 265 07/14/2017 0506   IRONPCTSAT 14 (L) 07/14/2017 0506    Exam:  on vent, eyes open off and on  neck - no jvd  chest coarse BS bilat  cor slow HR, no MRG  abd - soft ntnd no ascites  ext - purpuric rash over bilat LE's and arms  left upper arm AVF +bruit  CXR 9/21 > R effusion and R basilar scarring, o/w clear  Dialysis: [facility]  4h  300/800  65kg  3K/2.25 bath  LUA AVF   Hep none  - mircera 75 ug every 4 wks, last      Impression: 1. ESRD -  unstable for HD, on CRRT now. K+ better, still up. Cont CRRT until K+ better.  2. Shock r/o sepsis - empiric abx 3. VDRF  4. Hypotension: chronic on midodrine 10 bid at home 5. 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 Anemia ckd - Hb 10- 11, hold esa for now 7. Rash/ LCV - per recent skin bx 8. MBD ckd - hold binders while on vent 9. Thrombocytopenia - plts ok here  Plan - as above   Kelly Splinter MD [facility] pager [phone]   08/07/2018, 11:22 AM   Recent Labs  Lab 08/06/18 1408  08/06/18 2233 08/07/18 0349  NA 134*   < > 138 136  K >7.5*   < > 6.2* 5.9*  CL 97*   < > 106 104  CO2 23  --  18* 18*  GLUCOSE 102*   < > 88 100*  BUN 76*   < > 74* 62*  CREATININE 8.05*   < > 7.18* 6.04*  CALCIUM 9.1  --  8.5* 8.5*  PHOS  --    < > 5.4* 4.7*  ALBUMIN 3.4*  --  3.1* 3.2*  INR 4.24*  --   --  2.24   < > = values in this interval not displayed.   Recent Labs  Lab 08/05/18 1144 08/06/18 1408  AST 24 30  ALT 14 16  ALKPHOS 163* 120  BILITOT 1.0 1.8*  PROT 8.6* 7.6   Recent Labs  Lab 08/06/18 1408 08/06/18 1751 08/07/18 0349  WBC 6.5  --  10.0  NEUTROABS 4.9  --   --   HGB 10.9* 11.6* 10.7*  HCT 34.0* 34.0* 33.0*  MCV 87.6  --  87.5  PLT 118*  --  167

## 2018-08-07 NOTE — Progress Notes (Signed)
  Echocardiogram 2D Echocardiogram has been performed.  Jannett Celestine 08/07/2018, 1:13 PM

## 2018-08-07 NOTE — Progress Notes (Signed)
Initial Nutrition Assessment  DOCUMENTATION CODES:   Not applicable  INTERVENTION:  - Will order TF regimen: Vital AF 1.2 @ 50 mL/hr with 30 mL Prostat BID. This regimen will provide 1640 kcal (103% estimated kcal need), 120 grams of protein, and 973 mL free water. - Free water flush, if desired, to be per MD.  NUTRITION DIAGNOSIS:   Inadequate oral intake related to inability to eat as evidenced by NPO status.  GOAL:   Patient will meet greater than or equal to 90% of their needs  MONITOR:   Vent status, TF tolerance, Weight trends, Labs, I & O's  REASON FOR ASSESSMENT:   Ventilator, Consult Enteral/tube feeding initiation and management  ASSESSMENT:   57 year old male on HD MWF with last treatment on 9/18. He showed up to dialysis on 9/20 and was found to be altered lethargic and hypotensive. He was sent to the ED for further evaluation. Patient was subsequently intubated in the ED and initiated on CRRT on 9/20 around 11:00 PM.  No family/visitors present at bedside. Spoke with RN. Patient is intubated with OGT in place. Per chart review, current weight is 80.1 kg and weight on 9/19 was 70.4 kg; used weight from 9/19 to estimate needs.   Per Dr. Barkley Bruns note this AM: patient on CRRT as he is unstable for iHD at this time.   Patient is currently intubated on ventilator support MV: 8.6 L/min Temp (24hrs), Avg:96.3 F (35.7 C), Min:94.1 F (34.5 C), Max:98.2 F (36.8 C) Propofol: none BP: 102/62 and MAP: 75   Medications reviewed; 1 g CaCl x1 run yesterday, 20 mg Pepcid per OGT/day, 50 mg Solu-Cortef QID, sliding scale Novolog, 5 units Novolog x1 dose yesterday, 10 mg IV vitamin K x1 run yesterday and x1 run today.  Labs reviewed; K: 5.9 mmol/L, BUN: 62 mg/dL, creatinine: 6.04 mg/dL, Ca: 8.5 mg/dL, Phos: 4.7 mg/dL, GFR: 9 mL/min.     NUTRITION - FOCUSED PHYSICAL EXAM:    Most Recent Value  Orbital Region  No depletion  Upper Arm Region  No depletion  Thoracic  and Lumbar Region  Unable to assess  Buccal Region  No depletion  Temple Region  No depletion  Clavicle Bone Region  No depletion  Clavicle and Acromion Bone Region  No depletion  Scapular Bone Region  Unable to assess  Dorsal Hand  Unable to assess [bilateral mittens]  Patellar Region  No depletion  Anterior Thigh Region  No depletion  Posterior Calf Region  No depletion  Edema (RD Assessment)  Mild  Hair  Reviewed  Eyes  Reviewed  Mouth  Unable to assess  Skin  Reviewed  Nails  Unable to assess       Diet Order:   Diet Order            Diet NPO time specified  Diet effective now              EDUCATION NEEDS:   No education needs have been identified at this time  Skin:  Skin Assessment: Reviewed RN Assessment  Last BM:  PTA/unknown  Height:   Ht Readings from Last 1 Encounters:  08/05/18 5\' 5"  (1.651 m)    Weight:   Wt Readings from Last 1 Encounters:  08/07/18 80.1 kg    Ideal Body Weight:  61.82 kg  BMI:  Body mass index is 29.39 kg/m.  Estimated Nutritional Needs:   Kcal:  1597  Protein:  106-140 grams (1.5-2 grams/kg)  Fluid:  >/= 1.2  L/day     Michael Matin, MS, RD, LDN, [facility] Inpatient Clinical Dietitian Pager # [phone] After hours/weekend pager # [phone]

## 2018-08-07 NOTE — Progress Notes (Signed)
PCCM Interval Note   Patient with continued bleeding from HD cath site. INR is now down to 2.2 s/p 2 units FFP and 10 mg vitamin K. Had thrombi pad application at 5973. Will now place Lido-Epi injection around the site. When taking down dressing a large clot was noted to insertion site of HD cath, this clot was maintained, additional thrombi pad placed, covered with pressure dressing.   Hayden Pedro, AGACNP-BC [place]  Pgr: [phone]  PCCM Pgr: [phone]

## 2018-08-07 NOTE — Progress Notes (Signed)
NAMEJabir [name], MRN:  [id number], DOB:  [birth year], LOS: 1 ADMISSION DATE:  08/06/2018, CONSULTATION DATE:  08/07/18 REFERRING MD:  ED., CHIEF COMPLAINT:  Altered mental status with hypoxia  Brief History   Presented with hypotension, hypothermia and hypoxia.   Known ESRD on HD. History of prior polysubstance abuse but patient had been doing well recently.  Intubated for respiratory distress. Found to have RLL infiltrate so treated for presumed pneumonia.   R IJ Temporary catheter inserted for CRRT.  Consults: date of consult/date signed off & final recs:  Nephrology to manage CRRT  Procedures (surgical and bedside):  9/21. Insertion of HD catheter R IJ  Subjective:  Significant oozing from central line site.  Patient indicates that he feels better.  Objective   Blood pressure 109/75, pulse 87, temperature (!) 97.3 F (36.3 C), resp. rate 14, weight 80.1 kg, SpO2 99 %.    Vent Mode: PRVC FiO2 (%):  [40 %-60 %] 40 % Set Rate:  [18 bmp] 18 bmp Vt Set:  [500 mL] 500 mL PEEP:  [8 cmH20] 8 cmH20 Pressure Support:  [8 cmH20] 8 cmH20 Plateau Pressure:  [17 cmH20-26 cmH20] 17 cmH20   Intake/Output Summary (Last 24 hours) at 08/07/2018 2327 Last data filed at 08/07/2018 2300 Gross per 24 hour  Intake 2225.88 ml  Output 4343 ml  Net -2117.12 ml   Filed Weights   08/06/18 1840 08/07/18 0500  Weight: 75.9 kg 80.1 kg    Examination: General: awake and alert. Well nourishe. HENT: Marked oozing from lower part of insertion site. ETT and OGT in place. Lungs: tolerating PRVC with minimal ventilator settings Cardiovascular: Weaning NE. Extremities warm.  Abdomen: Soft and non tender. Extremities: Moderate edema/ Neuro: intact with no focal deficits  GU: anuric  Resolved Hospital Problem list    Assessment & Plan:   Shock due sepsis from RLL pneumonia.  Continue to wean pressors Adequately volume resuscitated.  Respiratory failure from RLL pneumonia. Minimal  secretions and minimal settings. SBT in am.  ESRD on CRRT.  Continue CRRT while on pressors and transition to IHD.  Bleeding from insertion site. Correct coagulopathy with vitamin K. Bleeding stopped with purse string suture around insertion site.  Disposition / Summary of Today's Plan 08/07/18   Wean pressors.    Nutritional status and diet: moderate risk - start tube feeds Pain/Anxiety/Delirium reduction: Intermittent only. VAP protocol (if indicated) in place. DVT prophylaxis: heparinized for CRRT GI prophylaxis: Famotidine Hyperglycemia protocol: SSI Mobility: Once extubated Antibiotic de-escalation: pending cultures. Code Status: Full Family Communication: Patient update.  Labs and Ancillary Testing (personally reviewed)  CBC: no leukocytosis.  Stable anemia. Chemistry: hyperkalemia and acidosis correcting with CRRT.  ABG Normal ABG on PRVC  Coagulation Profile: INR 2.24 - Vitamin K given  Cardiac Enzymes: negligible troponin rise.  CBG: well controlled.  Microbiology: blood and respiratory cultures negative. MRSA negative.  CRITICAL CARE Performed by: Kipp Brood   Total critical care time: 40 minutes  Critical care time was exclusive of separately billable procedures and treating other patients.  Critical care was necessary to treat or prevent imminent or life-threatening deterioration.  Critical care was time spent personally by me on the following activities: development of treatment plan with patient and/or surrogate as well as nursing, discussions with consultants, evaluation of patient's response to treatment, examination of patient, obtaining history from patient or surrogate, ordering and performing treatments and interventions, ordering and review of laboratory studies, ordering and review of radiographic studies, pulse oximetry  and re-evaluation of patient's condition.   Kipp Brood, MD [facility] ICU Physician [place]  Pager:  [phone] Mobile: [phone] After hours: [phone].

## 2018-08-08 LAB — GLUCOSE, CAPILLARY
Glucose-Capillary: 115 mg/dL — ABNORMAL HIGH (ref 70–99)
Glucose-Capillary: 121 mg/dL — ABNORMAL HIGH (ref 70–99)
Glucose-Capillary: 151 mg/dL — ABNORMAL HIGH (ref 70–99)
Glucose-Capillary: 152 mg/dL — ABNORMAL HIGH (ref 70–99)
Glucose-Capillary: 158 mg/dL — ABNORMAL HIGH (ref 70–99)
Glucose-Capillary: 161 mg/dL — ABNORMAL HIGH (ref 70–99)

## 2018-08-08 LAB — MAGNESIUM
MAGNESIUM: 2.5 mg/dL — AB (ref 1.7–2.4)
Magnesium: 2.5 mg/dL — ABNORMAL HIGH (ref 1.7–2.4)

## 2018-08-08 LAB — COMPREHENSIVE METABOLIC PANEL
ALK PHOS: 163 IU/L — AB (ref 39–117)
ALT: 14 IU/L (ref 0–44)
AST: 24 IU/L (ref 0–40)
Albumin/Globulin Ratio: 1 — ABNORMAL LOW (ref 1.2–2.2)
Albumin: 4.2 g/dL (ref 3.5–5.5)
BUN / CREAT RATIO: 9 (ref 9–20)
BUN: 58 mg/dL — AB (ref 6–24)
Bilirubin Total: 1 mg/dL (ref 0.0–1.2)
CHLORIDE: 97 mmol/L (ref 96–106)
CO2: 24 mmol/L (ref 20–29)
Calcium: 9.6 mg/dL (ref 8.7–10.2)
Creatinine, Ser: 6.25 mg/dL — ABNORMAL HIGH (ref 0.76–1.27)
GFR calc Af Amer: 10 mL/min/{1.73_m2} — ABNORMAL LOW (ref >59)
GFR calc non Af Amer: 9 mL/min/{1.73_m2} — ABNORMAL LOW (ref >59)
GLUCOSE: 190 mg/dL — AB (ref 65–99)
Globulin, Total: 4.4 g/dL (ref 1.5–4.5)
Potassium: 5.4 mmol/L — ABNORMAL HIGH (ref 3.5–5.2)
Sodium: 141 mmol/L (ref 134–144)
Total Protein: 8.6 g/dL — ABNORMAL HIGH (ref 6.0–8.5)

## 2018-08-08 LAB — CBC WITH DIFFERENTIAL/PLATELET
BASOS PCT: 0 %
Basophils Absolute: 0 10*3/uL (ref 0.0–0.1)
Eosinophils Absolute: 0 10*3/uL (ref 0.0–0.7)
Eosinophils Relative: 0 %
HEMATOCRIT: 25.7 % — AB (ref 39.0–52.0)
HEMOGLOBIN: 8.3 g/dL — AB (ref 13.0–17.0)
LYMPHS ABS: 1 10*3/uL (ref 0.7–4.0)
LYMPHS PCT: 11 %
MCH: 28.5 pg (ref 26.0–34.0)
MCHC: 32.3 g/dL (ref 30.0–36.0)
MCV: 88.3 fL (ref 78.0–100.0)
MONOS PCT: 8 %
Monocytes Absolute: 0.7 10*3/uL (ref 0.1–1.0)
NEUTROS ABS: 7 10*3/uL (ref 1.7–7.7)
NEUTROS PCT: 81 %
Platelets: 148 10*3/uL — ABNORMAL LOW (ref 150–400)
RBC: 2.91 MIL/uL — ABNORMAL LOW (ref 4.22–5.81)
RDW: 16 % — ABNORMAL HIGH (ref 11.5–15.5)
WBC: 8.7 10*3/uL (ref 4.0–10.5)

## 2018-08-08 LAB — PREPARE FRESH FROZEN PLASMA: UNIT DIVISION: 0

## 2018-08-08 LAB — RENAL FUNCTION PANEL
ANION GAP: 14 (ref 5–15)
ANION GAP: 15 (ref 5–15)
Albumin: 3 g/dL — ABNORMAL LOW (ref 3.5–5.0)
Albumin: 2.9 g/dL — ABNORMAL LOW (ref 3.5–5.0)
BUN: 45 mg/dL — AB (ref 6–20)
BUN: 43 mg/dL — ABNORMAL HIGH (ref 6–20)
CALCIUM: 7.9 mg/dL — AB (ref 8.9–10.3)
CO2: 20 mmol/L — AB (ref 22–32)
CO2: 20 mmol/L — AB (ref 22–32)
Calcium: 8.3 mg/dL — ABNORMAL LOW (ref 8.9–10.3)
Chloride: 100 mmol/L (ref 98–111)
Chloride: 99 mmol/L (ref 98–111)
Creatinine, Ser: 3.7 mg/dL — ABNORMAL HIGH (ref 0.61–1.24)
Creatinine, Ser: 3.71 mg/dL — ABNORMAL HIGH (ref 0.61–1.24)
GFR calc Af Amer: 19 mL/min — ABNORMAL LOW (ref >60)
GFR calc non Af Amer: 17 mL/min — ABNORMAL LOW (ref >60)
GFR calc non Af Amer: 17 mL/min — ABNORMAL LOW (ref >60)
GFR, EST AFRICAN AMERICAN: 19 mL/min — AB (ref >60)
GLUCOSE: 139 mg/dL — AB (ref 70–99)
Glucose, Bld: 193 mg/dL — ABNORMAL HIGH (ref 70–99)
POTASSIUM: 4.2 mmol/L (ref 3.5–5.1)
POTASSIUM: 4 mmol/L (ref 3.5–5.1)
Phosphorus: 4.9 mg/dL — ABNORMAL HIGH (ref 2.5–4.6)
Phosphorus: 4.6 mg/dL (ref 2.5–4.6)
SODIUM: 134 mmol/L — AB (ref 135–145)
Sodium: 134 mmol/L — ABNORMAL LOW (ref 135–145)

## 2018-08-08 LAB — AMIODARONE LEVEL
Amiodarone, Serum: 0.3 ug/mL — ABNORMAL LOW (ref 1.0–2.5)
N-DESETHYL-AMIODARONE: 0.2 ug/mL — AB (ref 1.0–2.5)

## 2018-08-08 LAB — BPAM FFP
BLOOD PRODUCT EXPIRATION DATE: 201909252359
ISSUE DATE / TIME: 201909210030
Unit Type and Rh: 7300

## 2018-08-08 LAB — PHOSPHORUS: PHOSPHORUS: 4.4 mg/dL (ref 2.5–4.6)

## 2018-08-08 LAB — PROTIME-INR
INR: 1.82
Prothrombin Time: 20.9 seconds — ABNORMAL HIGH (ref 11.4–15.2)

## 2018-08-08 LAB — TSH: TSH: 2.6 u[IU]/mL (ref 0.450–4.500)

## 2018-08-08 MED ORDER — PRISMASOL BGK 4/2.5 32-4-2.5 MEQ/L IV SOLN
INTRAVENOUS | Status: DC
  Administered 2018-08-08 (×2): via INTRAVENOUS_CENTRAL
  Filled 2018-08-08 (×4): qty 5000

## 2018-08-08 MED ORDER — SODIUM CHLORIDE 0.9 % IV SOLN
2.0000 g | Freq: Two times a day (BID) | INTRAVENOUS | Status: DC
  Administered 2018-08-08 – 2018-08-09 (×2): 2 g via INTRAVENOUS
  Filled 2018-08-08 (×3): qty 2

## 2018-08-08 NOTE — Progress Notes (Signed)
NAMEJaren [name], MRN:  [id number], DOB:  08-May-1961, LOS: 2 ADMISSION DATE:  08/06/2018, CONSULTATION DATE:  08/07/18 REFERRING MD:  ED., CHIEF COMPLAINT:  Altered mental status with hypoxia  Brief History   Presented with hypotension, hypothermia and hypoxia.   Known ESRD on HD. History of prior polysubstance abuse but patient had been doing well recently.  Intubated for respiratory distress. Found to have RLL infiltrate so treated for presumed pneumonia.   R IJ Temporary catheter inserted for CRRT.  Consults: date of consult/date signed off & final recs:  Nephrology to manage CRRT  Procedures (surgical and bedside):  9/21. Insertion of HD catheter R IJ  Subjective:  Significant oozing from central line site has stopped.  Patient indicates that he feels better.  Indicating ready for extubation.  Objective   Blood pressure (!) 82/70, pulse 91, temperature (!) 96.6 F (35.9 C), resp. rate 10, weight 73.4 kg, SpO2 100 %.    Vent Mode: CPAP;PSV FiO2 (%):  [40 %] 40 % Set Rate:  [18 bmp] 18 bmp Vt Set:  [500 mL] 500 mL PEEP:  [5 cmH20-8 cmH20] 5 cmH20 Pressure Support:  [8 cmH20-10 cmH20] 10 cmH20 Plateau Pressure:  [17 cmH20-26 cmH20] 19 cmH20   Intake/Output Summary (Last 24 hours) at 08/08/2018 0956 Last data filed at 08/08/2018 0900 Gross per 24 hour  Intake 1879.79 ml  Output 4023 ml  Net -2143.21 ml   Filed Weights   08/06/18 1840 08/07/18 0500 08/08/18 0449  Weight: 75.9 kg 80.1 kg 73.4 kg    Examination: General: awake and alert. Well nourished HENT: Marked oozing from lower part of insertion site. ETT and OGT in place. Lungs: tolerating PRVC with minimal ventilator settings Cardiovascular: Weaning NE. Extremities warm.  Abdomen: Soft and non tender. Extremities: Moderate edema/Stable LE rash Neuro: intact with no focal deficits  GU: anuric  Resolved Hospital Problem list    Assessment & Plan:   Shock due sepsis from RLL pneumonia  -resolved.Marland Kitchen  Respiratory failure from RLL pneumonia. Passed SBT  Will extubate  ESRD on CRRT.  Continue CRRT per nephrology. Consider increase acetate in bath.  Bleeding from insertion site. Correct coagulopathy with vitamin K. Bleeding stopped with purse string suture around insertion site.  Disposition / Summary of Today's Plan 08/08/18   Extubate.    Nutritional status and diet: moderate risk - Advance diet once extubated. Pain/Anxiety/Delirium reduction: Intermittent only. VAP protocol (if indicated) in place. DVT prophylaxis: heparinized for CRRT GI prophylaxis: Famotidine Hyperglycemia protocol: SSI Mobility: Once extubated Antibiotic de-escalation: pending cultures. Code Status: Full Family Communication: Patient update.  Labs and Ancillary Testing (personally reviewed)  CBC: no leukocytosis.  Stable anemia. Mild thrombocytopenia Chemistry: hyperkalemia and acidosis correcting with CRRT.  Mild metabolic acidosis   ABG Normal ABG on PRVC  Coagulation Profile: INR 2.24 - Vitamin K given  Cardiac Enzymes: negligible troponin rise.  CBG: well controlled.  Microbiology: blood and respiratory cultures negative. MRSA negative.  CRITICAL CARE Performed by: Kipp Brood   Total critical care time: 40 minutes  Critical care time was exclusive of separately billable procedures and treating other patients.  Critical care was necessary to treat or prevent imminent or life-threatening deterioration.  Critical care was time spent personally by me on the following activities: development of treatment plan with patient and/or surrogate as well as nursing, discussions with consultants, evaluation of patient's response to treatment, examination of patient, obtaining history from patient or surrogate, ordering and performing treatments and interventions, ordering and  review of laboratory studies, ordering and review of radiographic studies, pulse oximetry and re-evaluation of  patient's condition.   Kipp Brood, MD [facility] ICU Physician [place]  Pager: [phone] Mobile: [phone] After hours: [phone].

## 2018-08-08 NOTE — Progress Notes (Signed)
[facility] Progress Note  Subjective: bleeding stopped. Net neg 2.3 L yest, off pressors, BP's in the 80's-90's  Vitals:   08/08/18 0753 08/08/18 0800 08/08/18 0900 08/08/18 1000  BP: (!) 82/50 90/62 (!) 82/70 (!) 82/52  Pulse: 90 86 91 (!) 56  Resp: _0 Temp:  (!) 95.9 F (35.5 C) (!) 96.6 F (35.9 C) (!) 97.5 F (36.4 C)  TempSrc:  Bladder  Bladder  SpO2: 100% 100% 100% 100%  Weight:        Inpatient medications: . atorvastatin  20 mg Oral Daily  . chlorhexidine gluconate (MEDLINE KIT)  15 mL Mouth Rinse BID  . famotidine  20 mg Per Tube Daily  . feeding supplement (PRO-STAT SUGAR FREE 64)  30 mL Per Tube BID  . insulin aspart  0-15 Units Subcutaneous Q4H  . mouth rinse  15 mL Mouth Rinse 10 times per day  . midodrine  5 mg Per Tube TID WC   . sodium chloride 10 mL/hr at 08/07/18 1000  . ceFEPime (MAXIPIME) IV    . feeding supplement (VITAL AF 1.2 CAL) 1,000 mL (08/07/18 1813)  . heparin 999 mL/hr at 08/06/18 2303  . norepinephrine (LEVOPHED) Adult infusion Stopped (08/08/18 6803)  . dialysate (PRISMASATE) 1,000 mL/hr at 08/08/18 0746  . dialysis replacement fluid (prismasate) 300 mL/hr at 08/08/18 0242  . dialysis replacement fluid (prismasate)    . vancomycin Stopped (08/07/18 1913)   Place/Maintain arterial line **AND** sodium chloride, heparin, heparin, ipratropium-albuterol  Iron/TIBC/Ferritin/ %Sat    Component Value Date/Time   IRON 55 07/14/2017 0506   TIBC 392 07/14/2017 0506   FERRITIN 265 07/14/2017 0506   IRONPCTSAT 14 (L) 07/14/2017 0506    Exam:  on vent, eyes open off and on  neck - no jvd  chest coarse BS bilat  cor slow HR, no MRG  abd - soft ntnd no ascites  ext - purpuric rash over bilat LE's and arms  left upper arm AVF +bruit  CXR 9/21 > R effusion and R basilar scarring, o/w clear  [facility]  4h  300/800  65kg  3K/2.25 bath  LUA AVF   Hep none  - mircera 75 ug every 4 wks      Impression: 1. ESRD - cont CRRT, ^UF as tol, still sig vol overload 2. Shock / sepsis/ PNA  - on empiric abx 3. VDRF - to extubate today 4. Hypotension: chronic on midodrine 5 tid as at home, okay to pull fluid if SBP > 80 5. 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 Anemia ckd - Hb down 8-9 range , follow 7. Rash/ LCV - per recent skin bx 8. MBD ckd - hold binders while on vent 9. Thrombocytopenia - plts ok here  Plan - as above   Kelly Splinter MD [facility] pager [phone]   08/08/2018, 10:27 AM   Recent Labs  Lab 08/06/18 1408  08/07/18 0349  08/07/18 1630 08/08/18 0444  NA 134*   < > 136  --  138 134*  K >7.5*   < > 5.9*  --  5.1 4.2  CL 97*   < > 104  --  103 100  CO2 23   < > 18*  --  21* 20*  GLUCOSE 102*   < > 100*  --  140* 139*  BUN 76*   < > 62*  --  50* 45*  CREATININE 8.05*   < >  6.04*  --  4.38* 3.70*  CALCIUM 9.1   < > 8.5*  --  8.5* 8.3*  PHOS  --    < > 4.7*   < > 4.8*  4.9* 4.9*  ALBUMIN 3.4*   < > 3.2*  --  2.9* 3.0*  INR 4.24*  --  2.24  --   --   --    < > = values in this interval not displayed.   Recent Labs  Lab 08/05/18 1144 08/06/18 1408  AST 24 30  ALT 14 16  ALKPHOS 163* 120  BILITOT 1.0 1.8*  PROT 8.6* 7.6   Recent Labs  Lab 08/06/18 1408  08/07/18 0349 08/08/18 0444  WBC 6.5  --  10.0 8.7  NEUTROABS 4.9  --   --  7.0  HGB 10.9*   < > 10.7* 8.3*  HCT 34.0*   < > 33.0* 25.7*  MCV 87.6  --  87.5 88.3  PLT 118*  --  167 148*   < > = values in this interval not displayed.

## 2018-08-08 NOTE — Procedures (Signed)
Extubation Procedure Note  Patient Details:   Name: Crisanto Nied DOB: [birth year] MRN: [id number]   Airway Documentation:    Vent end date: 08/08/18 Vent end time: 1026   Evaluation  O2 sats: stable throughout Complications: No apparent complications Patient did tolerate procedure well. Bilateral Breath Sounds: Rhonchi, Diminished   Yes   Extubated PT to 3L [place]  PT was able to talk and cough   Stats are stable   Rt to monitor   Sahej Schrieber, Leonie Douglas 08/08/2018, 10:27 AM

## 2018-08-09 DIAGNOSIS — J9601 Acute respiratory failure with hypoxia: Secondary | ICD-10-CM

## 2018-08-09 DIAGNOSIS — G9340 Encephalopathy, unspecified: Secondary | ICD-10-CM

## 2018-08-09 DIAGNOSIS — A419 Sepsis, unspecified organism: Principal | ICD-10-CM

## 2018-08-09 DIAGNOSIS — N186 End stage renal disease: Secondary | ICD-10-CM

## 2018-08-09 DIAGNOSIS — R6521 Severe sepsis with septic shock: Secondary | ICD-10-CM

## 2018-08-09 LAB — RENAL FUNCTION PANEL
ALBUMIN: 3.1 g/dL — AB (ref 3.5–5.0)
ANION GAP: 12 (ref 5–15)
Albumin: 3 g/dL — ABNORMAL LOW (ref 3.5–5.0)
Anion gap: 11 (ref 5–15)
BUN: 34 mg/dL — AB (ref 6–20)
BUN: 36 mg/dL — AB (ref 6–20)
CHLORIDE: 98 mmol/L (ref 98–111)
CO2: 25 mmol/L (ref 22–32)
CO2: 24 mmol/L (ref 22–32)
CREATININE: 3.58 mg/dL — AB (ref 0.61–1.24)
Calcium: 8.2 mg/dL — ABNORMAL LOW (ref 8.9–10.3)
Calcium: 8.5 mg/dL — ABNORMAL LOW (ref 8.9–10.3)
Chloride: 99 mmol/L (ref 98–111)
Creatinine, Ser: 3.04 mg/dL — ABNORMAL HIGH (ref 0.61–1.24)
GFR calc Af Amer: 24 mL/min — ABNORMAL LOW (ref >60)
GFR calc Af Amer: 20 mL/min — ABNORMAL LOW (ref >60)
GFR calc non Af Amer: 17 mL/min — ABNORMAL LOW (ref >60)
GFR, EST NON AFRICAN AMERICAN: 21 mL/min — AB (ref >60)
GLUCOSE: 125 mg/dL — AB (ref 70–99)
GLUCOSE: 144 mg/dL — AB (ref 70–99)
PHOSPHORUS: 2.9 mg/dL (ref 2.5–4.6)
POTASSIUM: 3.1 mmol/L — AB (ref 3.5–5.1)
POTASSIUM: 3.3 mmol/L — AB (ref 3.5–5.1)
Phosphorus: 3.6 mg/dL (ref 2.5–4.6)
SODIUM: 136 mmol/L (ref 135–145)
Sodium: 133 mmol/L — ABNORMAL LOW (ref 135–145)

## 2018-08-09 LAB — GLUCOSE, CAPILLARY
Glucose-Capillary: 104 mg/dL — ABNORMAL HIGH (ref 70–99)
Glucose-Capillary: 112 mg/dL — ABNORMAL HIGH (ref 70–99)
Glucose-Capillary: 120 mg/dL — ABNORMAL HIGH (ref 70–99)
Glucose-Capillary: 124 mg/dL — ABNORMAL HIGH (ref 70–99)
Glucose-Capillary: 151 mg/dL — ABNORMAL HIGH (ref 70–99)
Glucose-Capillary: 88 mg/dL (ref 70–99)
Glucose-Capillary: 95 mg/dL (ref 70–99)

## 2018-08-09 LAB — CBC WITH DIFFERENTIAL/PLATELET
Abs Immature Granulocytes: 0.1 10*3/uL (ref 0.0–0.1)
BASOS ABS: 0 10*3/uL (ref 0.0–0.1)
BASOS PCT: 0 %
EOS PCT: 1 %
Eosinophils Absolute: 0.1 10*3/uL (ref 0.0–0.7)
HCT: 23.2 % — ABNORMAL LOW (ref 39.0–52.0)
HEMOGLOBIN: 7.5 g/dL — AB (ref 13.0–17.0)
Immature Granulocytes: 1 %
LYMPHS PCT: 23 %
Lymphs Abs: 2.2 10*3/uL (ref 0.7–4.0)
MCH: 28.6 pg (ref 26.0–34.0)
MCHC: 32.3 g/dL (ref 30.0–36.0)
MCV: 88.5 fL (ref 78.0–100.0)
MONO ABS: 1.3 10*3/uL — AB (ref 0.1–1.0)
Monocytes Relative: 14 %
NEUTROS ABS: 5.8 10*3/uL (ref 1.7–7.7)
Neutrophils Relative %: 61 %
PLATELETS: 126 10*3/uL — AB (ref 150–400)
RBC: 2.62 MIL/uL — AB (ref 4.22–5.81)
RDW: 16.2 % — ABNORMAL HIGH (ref 11.5–15.5)
WBC: 9.4 10*3/uL (ref 4.0–10.5)

## 2018-08-09 LAB — PROTIME-INR
INR: 1.64
Prothrombin Time: 19.3 seconds — ABNORMAL HIGH (ref 11.4–15.2)

## 2018-08-09 MED ORDER — MIDODRINE HCL 5 MG PO TABS
10.0000 mg | ORAL_TABLET | Freq: Three times a day (TID) | ORAL | Status: DC
  Administered 2018-08-09 – 2018-08-12 (×10): 10 mg
  Filled 2018-08-09 (×9): qty 2

## 2018-08-09 MED ORDER — PROMETHAZINE HCL 25 MG/ML IJ SOLN
12.5000 mg | Freq: Four times a day (QID) | INTRAMUSCULAR | Status: DC | PRN
  Administered 2018-08-09: 12.5 mg via INTRAVENOUS
  Filled 2018-08-09: qty 1

## 2018-08-09 MED ORDER — SODIUM CHLORIDE 0.9 % IV SOLN
1.0000 g | INTRAVENOUS | Status: DC
  Administered 2018-08-10 – 2018-08-11 (×2): 1 g via INTRAVENOUS
  Filled 2018-08-09 (×3): qty 1

## 2018-08-09 MED ORDER — ONDANSETRON HCL 4 MG/2ML IJ SOLN
INTRAMUSCULAR | Status: AC
  Filled 2018-08-09: qty 2

## 2018-08-09 MED ORDER — OXYCODONE HCL 5 MG PO TABS
5.0000 mg | ORAL_TABLET | Freq: Four times a day (QID) | ORAL | Status: DC | PRN
  Administered 2018-08-09 – 2018-08-11 (×2): 5 mg via ORAL
  Filled 2018-08-09 (×2): qty 1

## 2018-08-09 MED ORDER — HEPARIN (PORCINE) IN NACL 100-0.45 UNIT/ML-% IJ SOLN
1150.0000 [IU]/h | INTRAMUSCULAR | Status: DC
  Administered 2018-08-09: 1050 [IU]/h via INTRAVENOUS
  Administered 2018-08-11 – 2018-08-12 (×2): 1150 [IU]/h via INTRAVENOUS
  Filled 2018-08-09 (×5): qty 250

## 2018-08-09 MED ORDER — DIPHENHYDRAMINE HCL 50 MG/ML IJ SOLN
25.0000 mg | Freq: Once | INTRAMUSCULAR | Status: AC
  Administered 2018-08-09: 25 mg via INTRAVENOUS
  Filled 2018-08-09: qty 1

## 2018-08-09 MED ORDER — SODIUM CHLORIDE 0.9 % IV SOLN: 1.0000 g | INTRAVENOUS | Status: DC

## 2018-08-09 MED ORDER — ONDANSETRON HCL 4 MG/2ML IJ SOLN
4.0000 mg | Freq: Once | INTRAMUSCULAR | Status: AC
  Administered 2018-08-09: 4 mg via INTRAVENOUS

## 2018-08-09 MED ORDER — HYDROCORTISONE 1 % EX CREA
TOPICAL_CREAM | Freq: Two times a day (BID) | CUTANEOUS | Status: DC | PRN
  Administered 2018-08-09: 1 via TOPICAL
  Filled 2018-08-09 (×3): qty 28

## 2018-08-09 NOTE — Progress Notes (Signed)
NAMELundon Verdejo, MRN:  [id number], DOB:  26-Feb-1961, LOS: 3 ADMISSION DATE:  08/06/2018, CONSULTATION DATE:  08/07/18 REFERRING MD:  ED., CHIEF COMPLAINT:  Altered mental status with hypoxia  Brief History   Presented with hypotension, hypothermia and hypoxia.   Known ESRD on HD. History of prior polysubstance abuse but patient had been doing well recently.  Intubated for respiratory distress. Found to have RLL infiltrate so treated for presumed pneumonia.   R IJ Temporary catheter inserted for CRRT.  Consults: date of consult/date signed off & final recs:  Nephrology to manage CRRT  Procedures (surgical and bedside):  9/21. Insertion of HD catheter R IJ  Subjective:  No events overnight, no new complaints  Objective   Blood pressure (!) 150/129, pulse 98, temperature 97.9 F (36.6 C), temperature source Oral, resp. rate 13, weight 73.4 kg, SpO2 95 %.        Intake/Output Summary (Last 24 hours) at 08/09/2018 1131 Last data filed at 08/09/2018 0931 Gross per 24 hour  Intake 1079.03 ml  Output 3664 ml  Net -2584.97 ml   Filed Weights   08/06/18 1840 08/07/18 0500 08/08/18 0449  Weight: 75.9 kg 80.1 kg 73.4 kg    Examination: General: Awake and interactive, moving all ext to command HENT: [place]/AT, PERRL, EOM-I and MMM Lungs: CTA bilaterally  Cardiovascular: RRR, Nl S1/S2 and -M/R/G Abdomen: Soft, NT, ND and +BS Extremities: -edema and -tenderness  Neuro: Intact GU: anuric  I reviewed CXR myself, no acute disease noted.  Resolved Hospital Problem list    Assessment & Plan:   Shock due sepsis from RLL pneumonia -  - D/C noreip  Respiratory failure from RLL pneumonia.  - Titrate O2 for sat of 88-92%  - SLP  ESRD on CRRT.  - D/C CRRT  - HD on MWF  Correct coagulopathy with vitamin K. Bleeding stopped with purse string suture around insertion site.  Hypokalemia:  - Hold replacement, check in AM given ESRD  Disposition / Summary of Today's Plan  08/09/18   Discussed with PCCM-NP and TRH-ND.  Transfer to tele and to [facility] service with PCCM off 9/24.    Nutritional status and diet: moderate risk - Advance diet once extubated. Pain/Anxiety/Delirium reduction: Intermittent only. VAP protocol (if indicated) in place. DVT prophylaxis: heparinized for CRRT GI prophylaxis: Famotidine Hyperglycemia protocol: SSI Mobility: Once extubated Antibiotic de-escalation: pending cultures. Code Status: Full Family Communication: Patient update.  Labs and Ancillary Testing (personally reviewed)  CBC: no leukocytosis.  Stable anemia. Mild thrombocytopenia Chemistry: hyperkalemia and acidosis correcting with CRRT.  Mild metabolic acidosis   Coagulation Profile: INR 2.24 - Vitamin K given  Cardiac Enzymes: negligible troponin rise.  CBG: well controlled.  Microbiology: blood and respiratory cultures negative. MRSA negative.  BMET    Component Value Date/Time   NA 136 08/09/2018 0518   NA 141 08/05/2018 1144   K 3.1 (L) 08/09/2018 0518   CL 99 08/09/2018 0518   CO2 25 08/09/2018 0518   GLUCOSE 125 (H) 08/09/2018 0518   BUN 34 (H) 08/09/2018 0518   BUN 58 (H) 08/05/2018 1144   CREATININE 3.04 (H) 08/09/2018 0518   CREATININE 1.61 (H) 01/16/2017 1653   CALCIUM 8.2 (L) 08/09/2018 0518   GFRNONAA 21 (L) 08/09/2018 0518   GFRNONAA 47 (L) 01/16/2017 1653   GFRAA 24 (L) 08/09/2018 0518   GFRAA 54 (L) 01/16/2017 1653   CBC    Component Value Date/Time   WBC 9.4 08/09/2018 0518   RBC  2.62 (L) 08/09/2018 0518   HGB 7.5 (L) 08/09/2018 0518   HGB 10.8 (L) 05/11/2018 1300   HCT 23.2 (L) 08/09/2018 0518   HCT 32.7 (L) 05/11/2018 1300   PLT 126 (L) 08/09/2018 0518   PLT 105 (L) 05/11/2018 1300   MCV 88.5 08/09/2018 0518   MCV 81 05/11/2018 1300   MCH 28.6 08/09/2018 0518   MCHC 32.3 08/09/2018 0518   RDW 16.2 (H) 08/09/2018 0518   RDW 16.5 (H) 05/11/2018 1300   LYMPHSABS 2.2 08/09/2018 0518   MONOABS 1.3 (H) 08/09/2018 0518   EOSABS  0.1 08/09/2018 0518   BASOSABS 0.0 08/09/2018 0518    Rush Farmer, M.D. [facility] Pulmonary/Critical Care Medicine. Pager: [phone]. After hours pager: [phone].

## 2018-08-09 NOTE — Progress Notes (Signed)
eLink Physician-Brief Progress Note Patient Name: Michael [name] DOB: [birth year] MRN: [id number]   Date of Service  08/09/2018  HPI/Events of Note  Nausea - QTc interval = 577 milliseconds.   eICU Interventions  Will order: 1. Phenergan 12.5 mg IV Q 6 hours PRN N/V.     Intervention Category Major Interventions: Other:  Lysle Dingwall 08/09/2018, 11:07 PM

## 2018-08-09 NOTE — Progress Notes (Signed)
ANTICOAGULATION CONSULT NOTE - Initial Consult  Pharmacy Consult for heparin Indication: atrial fibrillation  No Known Allergies  Patient Measurements: Weight: 161 lb 13.1 oz (73.4 kg) Heparin Dosing Weight: 73.4kg  Vital Signs: Temp: 98.3 F (36.8 C) (09/23 1523) Temp Source: Oral (09/23 1523) BP: 99/69 (09/23 1400) Pulse Rate: 44 (09/23 1400)  Labs: Recent Labs    08/07/18 0349 08/07/18 1009 08/07/18 1630 08/08/18 0444 08/08/18 1208 08/08/18 1606 08/09/18 0518  HGB 10.7*  --   --  8.3*  --   --  7.5*  HCT 33.0*  --   --  25.7*  --   --  23.2*  PLT 167  --   --  148*  --   --  126*  LABPROT 24.6*  --   --   --  20.9*  --  19.3*  INR 2.24  --   --   --  1.82  --  1.64  CREATININE 6.04*  --  4.38* 3.70*  --  3.71* 3.04*  TROPONINI 0.05* 0.04* 0.04*  --   --   --   --     Estimated Creatinine Clearance: 23 mL/min (A) (by C-G formula based on SCr of 3.04 mg/dL (H)).   Medical History: Past Medical History:  Diagnosis Date  . Anemia of chronic disease   . Asthma    said he does not know  . Chronic kidney disease (CKD), stage IV (severe) ([place])   . Chronic systolic CHF (congestive heart failure) (HCC)    a. prior EF normal; b. Echo 10/16: Mild LVH, EF 30-35%, mitral valve bioprosthesis present without evidence of stenosis or regurgitation, severe LAE, mild RVE with mild to moderately reduced RVSF, moderate RAE  . Cirrhosis ([place])   . Coronary artery disease   . Diabetes mellitus type 2 in obese ([place])   . Diabetes mellitus without complication ([place])   . Dyspnea   . GERD (gastroesophageal reflux disease)   . H/O tricuspid valve repair 2012  . History of echocardiogram    Echo at [facility] in [facility] 4/12: mod LVH, EF 60-65%, mod LAE, tissue MVR ok, mod TR, mod pulmo HTN with RVSP 48 mmHg  . Hypertension   . Obstructive sleep apnea   . Paroxysmal atrial fibrillation (HCC)    s/p Maze procedure at time of MVR in 2011  . Pulmonary HTN ([place])   . S/P mitral valve  replacement with porcine valve 2012  . Thrombocytopenia ([place])     Assessment: 1 yoM with ESRD on MWF HD presenting with hypotension and altered mental status, admitted for pneumonia and sepsis. Pharmacy has been consulted for heparin dosing. Patient on PTA warfarin for atrial fibrillation (PTA dose unclear, last reported as 3.75mg  daily) and supratherapeutic INR 4.24 at admission with bleeding from IJ HD cath site requiring purse string suture and anticoagulation reversal with vitamin K. Warfarin has been held since admission on 9/20  Hgb trending down to 7.5 and platelets relatively stable at 126 this morning. No further bleeding from IJ site reported. Will start heparin infusion with no bolus given recent bleeding and history of thrombocytopenia.    Goal of Therapy:  Heparin level 0.3-0.7 units/ml Monitor platelets by anticoagulation protocol: Yes   Plan:  Start heparin infusion at 1050 units/hr Check anti-Xa level in 8 hours and daily while on heparin Continue to monitor H&H and platelets  Monitor for s/sx bleeding   Brendolyn Patty, PharmD PGY1 Pharmacy Resident Phone [phone]  08/09/2018   3:47 PM

## 2018-08-09 NOTE — Progress Notes (Signed)
Pharmacy Antibiotic Note  Michael [name] is a 57 y.o. male admitted on 08/06/2018 with PNA/sepsis.  Pharmacy has been consulted for vancomycin dosing. Patient ESRD and was on CRRT (now discontinued) and planning for HD Wednesday (normally HD MWF). WBC stable and WNL at 9.4, last lactic acid trending down to 2.3 on 9/20, and no fevers have been recorded.   Plan: Change cefepime to 1g IV q 24h x 7 days total length of therapy F/u plans for HD scheduling  F/u de-escalation, cultures, clinical improvement  Weight: 161 lb 13.1 oz (73.4 kg)  Temp (24hrs), Avg:97.4 F (36.3 C), Min:97.2 F (36.2 C), Max:97.9 F (36.6 C)  Recent Labs  Lab 08/06/18 1408 08/06/18 1431 08/06/18 1535  08/06/18 1852 08/06/18 2011  08/07/18 0349 08/07/18 1630 08/08/18 0444 08/08/18 1606 08/09/18 0518  WBC 6.5  --   --   --   --   --   --  10.0  --  8.7  --  9.4  CREATININE 8.05*  --   --    < >  --   --    < > 6.04* 4.38* 3.70* 3.71* 3.04*  LATICACIDVEN  --  2.83* 2.45*  --  2.9* 2.3*  --   --   --   --   --   --    < > = values in this interval not displayed.    Estimated Creatinine Clearance: 23 mL/min (A) (by C-G formula based on SCr of 3.04 mg/dL (H)).    No Known Allergies  Antimicrobials this admission: vanc 9/20>>9/23 cefepime 9/20>>(9/27) Metronidazole 9/20>>9/21  Microbiology results: 9/20 Blood - NGTD 9/20 TA- sent  9/20 MRSA - NEG   Thank you for allowing pharmacy to be a part of this patient's care.  Brendolyn Patty, PharmD PGY1 Pharmacy Resident Phone [phone]  08/09/2018   12:57 PM

## 2018-08-09 NOTE — Progress Notes (Signed)
[facility] NEPHROLOGY PROGRESS NOTE  Assessment/ Plan: Pt is a 57 y.o. yo male ESRD on HD admitted with hypotension, hypothermia and hypoxia. [facility]: [facility]  4h  300/800  65kg  3K/2.25 bath  LUA AVF   Hep none  - mircera 75 ug every 4 wks  #Septic shock from right lower lobe pneumonia: Now extubated.  # ESRD:HD MWF. On CRRT since 9/20.  Discontinue CRRT today and plan for next intermittent hemodialysis on Wednesday.  # Anemia: Hb dropped to 7.5.  I will check iron, may need ESA.  # Secondary hyperparathyroidism: Calcium, phosphorus acceptable.  Not on binders  # Hypotension/volume: I will increase the dose of midodrine to 10 mg 3 times daily.  #Skin rash: Chronic in nature.  More itching today unknown if patient is allergic to medication.  Try a dose of Benadryl.  Per patient he had  skin biopsy by dermatology.  Subjective: Seen and examined in ICU.  Denied nausea vomiting chest pain shortness of breath.  Discussed with ICU nurse. Objective Vital signs in last 24 hours: Vitals:   08/09/18 0600 08/09/18 0700 08/09/18 0738 08/09/18 0752  BP: 98/78 105/63    Pulse:  96    Resp: 13 12  14   Temp:   (!) 97.3 F (36.3 C)   TempSrc:   Oral   SpO2:  100%  100%  Weight:       Weight change:   Intake/Output Summary (Last 24 hours) at 08/09/2018 0855 Last data filed at 08/09/2018 0800 Gross per 24 hour  Intake 1117.03 ml  Output 4017 ml  Net -2899.97 ml       Labs: Basic Metabolic Panel: Recent Labs  Lab 08/08/18 0444 08/08/18 1606 08/08/18 1607 08/09/18 0518  NA 134* 134*  --  136  K 4.2 4.0  --  3.1*  CL 100 99  --  99  CO2 20* 20*  --  25  GLUCOSE 139* 193*  --  125*  BUN 45* 43*  --  34*  CREATININE 3.70* 3.71*  --  3.04*  CALCIUM 8.3* 7.9*  --  8.2*  PHOS 4.9* 4.6 4.4 2.9   Liver Function Tests: Recent Labs  Lab 08/05/18 1144 08/06/18 1408  08/08/18 0444 08/08/18 1606 08/09/18 0518  AST 24 30  --   --   --   --   ALT 14 16  --    --   --   --   ALKPHOS 163* 120  --   --   --   --   BILITOT 1.0 1.8*  --   --   --   --   PROT 8.6* 7.6  --   --   --   --   ALBUMIN 4.2 3.4*   < > 3.0* 2.9* 3.1*   < > = values in this interval not displayed.   No results for input(s): LIPASE, AMYLASE in the last 168 hours. Recent Labs  Lab 08/06/18 1600  AMMONIA 43*   CBC: Recent Labs  Lab 08/06/18 1408  08/07/18 0349 08/08/18 0444 08/09/18 0518  WBC 6.5  --  10.0 8.7 9.4  NEUTROABS 4.9  --   --  7.0 5.8  HGB 10.9*   < > 10.7* 8.3* 7.5*  HCT 34.0*   < > 33.0* 25.7* 23.2*  MCV 87.6  --  87.5 88.3 88.5  PLT 118*  --  167 148* 126*   < > = values in this interval not displayed.  Cardiac Enzymes: Recent Labs  Lab 08/06/18 2233 08/07/18 0349 08/07/18 1009 08/07/18 1630  TROPONINI 0.06* 0.05* 0.04* 0.04*   CBG: Recent Labs  Lab 08/08/18 1545 08/08/18 2033 08/08/18 2359 08/09/18 0315 08/09/18 0736  GLUCAP 121* 152* 112* 120* 88    Iron Studies: No results for input(s): IRON, TIBC, TRANSFERRIN, FERRITIN in the last 72 hours. Studies/Results: No results found.  Medications: Infusions: . sodium chloride 10 mL/hr at 08/09/18 0600  . ceFEPime (MAXIPIME) IV Stopped (08/09/18 0547)  . heparin 999 mL/hr at 08/08/18 1447  . norepinephrine (LEVOPHED) Adult infusion Stopped (08/08/18 4037)  . dialysate (PRISMASATE) 1,000 mL/hr at 08/09/18 0541  . dialysis replacement fluid (prismasate) 300 mL/hr at 08/08/18 2115  . dialysis replacement fluid (prismasate) 500 mL/hr at 08/08/18 2336  . vancomycin Stopped (08/08/18 1909)    Scheduled Medications: . atorvastatin  20 mg Oral Daily  . diphenhydrAMINE  25 mg Intravenous Once  . famotidine  20 mg Per Tube Daily  . insulin aspart  0-15 Units Subcutaneous Q4H  . midodrine  5 mg Per Tube TID WC  . ondansetron        have reviewed scheduled and prn medications.  Physical Exam: General:NAD, comfortable Heart:RRR, s1s2 nl Lungs:clear b/l, no cracjle Abdomen:soft,  Non-tender, non-distended Extremities:No edema Dialysis Access: Left upper extremity AV fistula has good bruit  Dron Prasad Bhandari 08/09/2018,8:55 AM  LOS: 3 days

## 2018-08-09 NOTE — Progress Notes (Addendum)
Patient complaining of nausea, had small episode of emesis. Also complaining of intense itching on head, neck, and arms. Known rash outpatient that he is being seen for. CCM ordered zofran once and a hydrocortisone cream PRN till Renal arrives to further assess the rash. Will continue to monitor. Bartholomew Crews, RN 08/09/2018 7:33 AM

## 2018-08-10 ENCOUNTER — Other Ambulatory Visit: Payer: Self-pay

## 2018-08-10 ENCOUNTER — Encounter (HOSPITAL_COMMUNITY): Payer: Self-pay | Admitting: Family Medicine

## 2018-08-10 LAB — RENAL FUNCTION PANEL
ANION GAP: 13 (ref 5–15)
Albumin: 3.1 g/dL — ABNORMAL LOW (ref 3.5–5.0)
Albumin: 2.9 g/dL — ABNORMAL LOW (ref 3.5–5.0)
Albumin: 3 g/dL — ABNORMAL LOW (ref 3.5–5.0)
Anion gap: 15 (ref 5–15)
Anion gap: 14 (ref 5–15)
BUN: 41 mg/dL — ABNORMAL HIGH (ref 6–20)
BUN: 47 mg/dL — AB (ref 6–20)
BUN: 51 mg/dL — AB (ref 6–20)
CALCIUM: 8.7 mg/dL — AB (ref 8.9–10.3)
CHLORIDE: 100 mmol/L (ref 98–111)
CHLORIDE: 99 mmol/L (ref 98–111)
CO2: 20 mmol/L — AB (ref 22–32)
CO2: 22 mmol/L (ref 22–32)
CO2: 19 mmol/L — AB (ref 22–32)
CREATININE: 5.73 mg/dL — AB (ref 0.61–1.24)
Calcium: 8.7 mg/dL — ABNORMAL LOW (ref 8.9–10.3)
Calcium: 8.7 mg/dL — ABNORMAL LOW (ref 8.9–10.3)
Chloride: 98 mmol/L (ref 98–111)
Creatinine, Ser: 4.34 mg/dL — ABNORMAL HIGH (ref 0.61–1.24)
Creatinine, Ser: 5.28 mg/dL — ABNORMAL HIGH (ref 0.61–1.24)
GFR calc Af Amer: 13 mL/min — ABNORMAL LOW (ref >60)
GFR calc Af Amer: 11 mL/min — ABNORMAL LOW (ref >60)
GFR calc non Af Amer: 14 mL/min — ABNORMAL LOW (ref >60)
GFR calc non Af Amer: 11 mL/min — ABNORMAL LOW (ref >60)
GFR, EST AFRICAN AMERICAN: 16 mL/min — AB (ref >60)
GFR, EST NON AFRICAN AMERICAN: 10 mL/min — AB (ref >60)
GLUCOSE: 112 mg/dL — AB (ref 70–99)
GLUCOSE: 101 mg/dL — AB (ref 70–99)
Glucose, Bld: 91 mg/dL (ref 70–99)
POTASSIUM: 3.6 mmol/L (ref 3.5–5.1)
POTASSIUM: 4 mmol/L (ref 3.5–5.1)
Phosphorus: 4.5 mg/dL (ref 2.5–4.6)
Phosphorus: 4.7 mg/dL — ABNORMAL HIGH (ref 2.5–4.6)
Phosphorus: 4.7 mg/dL — ABNORMAL HIGH (ref 2.5–4.6)
Potassium: 3.6 mmol/L (ref 3.5–5.1)
SODIUM: 135 mmol/L (ref 135–145)
SODIUM: 133 mmol/L — AB (ref 135–145)
Sodium: 132 mmol/L — ABNORMAL LOW (ref 135–145)

## 2018-08-10 LAB — CBC WITH DIFFERENTIAL/PLATELET
Abs Immature Granulocytes: 0.1 10*3/uL (ref 0.0–0.1)
Basophils Absolute: 0.1 10*3/uL (ref 0.0–0.1)
Basophils Relative: 1 %
EOS ABS: 0.3 10*3/uL (ref 0.0–0.7)
EOS PCT: 3 %
HCT: 23.7 % — ABNORMAL LOW (ref 39.0–52.0)
HEMOGLOBIN: 7.6 g/dL — AB (ref 13.0–17.0)
Immature Granulocytes: 1 %
LYMPHS ABS: 2.3 10*3/uL (ref 0.7–4.0)
LYMPHS PCT: 26 %
MCH: 28.6 pg (ref 26.0–34.0)
MCHC: 32.1 g/dL (ref 30.0–36.0)
MCV: 89.1 fL (ref 78.0–100.0)
MONO ABS: 1.4 10*3/uL — AB (ref 0.1–1.0)
MONOS PCT: 16 %
NEUTROS ABS: 4.8 10*3/uL (ref 1.7–7.7)
NEUTROS PCT: 53 %
Platelets: 131 10*3/uL — ABNORMAL LOW (ref 150–400)
RBC: 2.66 MIL/uL — ABNORMAL LOW (ref 4.22–5.81)
RDW: 16.6 % — AB (ref 11.5–15.5)
WBC: 8.9 10*3/uL (ref 4.0–10.5)

## 2018-08-10 LAB — GLUCOSE, CAPILLARY
Glucose-Capillary: 89 mg/dL (ref 70–99)
Glucose-Capillary: 86 mg/dL (ref 70–99)
Glucose-Capillary: 157 mg/dL — ABNORMAL HIGH (ref 70–99)
Glucose-Capillary: 94 mg/dL (ref 70–99)
Glucose-Capillary: 109 mg/dL — ABNORMAL HIGH (ref 70–99)

## 2018-08-10 LAB — CBC
HEMATOCRIT: 22.6 % — AB (ref 39.0–52.0)
Hemoglobin: 7.1 g/dL — ABNORMAL LOW (ref 13.0–17.0)
MCH: 28.3 pg (ref 26.0–34.0)
MCHC: 31.4 g/dL (ref 30.0–36.0)
MCV: 90 fL (ref 78.0–100.0)
PLATELETS: 143 10*3/uL — AB (ref 150–400)
RBC: 2.51 MIL/uL — ABNORMAL LOW (ref 4.22–5.81)
RDW: 17.3 % — AB (ref 11.5–15.5)
WBC: 9.9 10*3/uL (ref 4.0–10.5)

## 2018-08-10 LAB — IRON AND TIBC
Iron: 212 ug/dL — ABNORMAL HIGH (ref 45–182)
SATURATION RATIOS: 79 % — AB (ref 17.9–39.5)
TIBC: 267 ug/dL (ref 250–450)
UIBC: 55 ug/dL

## 2018-08-10 LAB — PROTIME-INR
INR: 1.75
Prothrombin Time: 20.2 seconds — ABNORMAL HIGH (ref 11.4–15.2)

## 2018-08-10 LAB — FERRITIN: FERRITIN: 823 ng/mL — AB (ref 24–336)

## 2018-08-10 LAB — HEPARIN LEVEL (UNFRACTIONATED)
Heparin Unfractionated: 0.25 IU/mL — ABNORMAL LOW (ref 0.30–0.70)
Heparin Unfractionated: 0.26 IU/mL — ABNORMAL LOW (ref 0.30–0.70)
Heparin Unfractionated: 0.46 IU/mL (ref 0.30–0.70)

## 2018-08-10 LAB — MAGNESIUM: Magnesium: 2.5 mg/dL — ABNORMAL HIGH (ref 1.7–2.4)

## 2018-08-10 MED ORDER — SODIUM CHLORIDE 0.9 % IV SOLN: 100.0000 mL | INTRAVENOUS | Status: DC | PRN

## 2018-08-10 MED ORDER — WARFARIN - PHARMACIST DOSING INPATIENT: Freq: Every day | Status: DC

## 2018-08-10 MED ORDER — AMIODARONE HCL 200 MG PO TABS
200.0000 mg | ORAL_TABLET | Freq: Two times a day (BID) | ORAL | Status: DC
  Administered 2018-08-10 – 2018-08-12 (×4): 200 mg via ORAL
  Filled 2018-08-10 (×4): qty 1

## 2018-08-10 MED ORDER — PENTAFLUOROPROP-TETRAFLUOROETH EX AERO: 1.0000 "application " | INHALATION_SPRAY | CUTANEOUS | Status: DC | PRN

## 2018-08-10 MED ORDER — NEPRO/CARBSTEADY PO LIQD
237.0000 mL | Freq: Two times a day (BID) | ORAL | Status: DC
  Administered 2018-08-10 – 2018-08-12 (×4): 237 mL via ORAL
  Filled 2018-08-10 (×5): qty 237

## 2018-08-10 MED ORDER — SODIUM CHLORIDE 0.9 % IV BOLUS
1000.0000 mL | Freq: Once | INTRAVENOUS | Status: AC
  Administered 2018-08-10: 1000 mL via INTRAVENOUS

## 2018-08-10 MED ORDER — HEPARIN SODIUM (PORCINE) 1000 UNIT/ML DIALYSIS
1000.0000 [IU] | INTRAMUSCULAR | Status: DC | PRN
  Filled 2018-08-10: qty 1

## 2018-08-10 MED ORDER — LIDOCAINE HCL (PF) 1 % IJ SOLN: 5.0000 mL | INTRAMUSCULAR | Status: DC | PRN

## 2018-08-10 MED ORDER — ALTEPLASE 2 MG IJ SOLR
2.0000 mg | Freq: Once | INTRAMUSCULAR | Status: DC | PRN
  Filled 2018-08-10: qty 2

## 2018-08-10 MED ORDER — CHLORHEXIDINE GLUCONATE CLOTH 2 % EX PADS: 6.0000 | MEDICATED_PAD | Freq: Every day | CUTANEOUS | Status: DC

## 2018-08-10 MED ORDER — LIDOCAINE-PRILOCAINE 2.5-2.5 % EX CREA
1.0000 "application " | TOPICAL_CREAM | CUTANEOUS | Status: DC | PRN
  Filled 2018-08-10: qty 5

## 2018-08-10 MED ORDER — WARFARIN SODIUM 5 MG PO TABS
5.0000 mg | ORAL_TABLET | Freq: Once | ORAL | Status: AC
  Administered 2018-08-10: 5 mg via ORAL
  Filled 2018-08-10: qty 1

## 2018-08-10 MED ORDER — DARBEPOETIN ALFA 100 MCG/0.5ML IJ SOSY
100.0000 ug | PREFILLED_SYRINGE | INTRAMUSCULAR | Status: DC
  Administered 2018-08-11: 100 ug via INTRAVENOUS
  Filled 2018-08-10: qty 0.5

## 2018-08-10 MED ORDER — HEPARIN SODIUM (PORCINE) 1000 UNIT/ML DIALYSIS
20.0000 [IU]/kg | INTRAMUSCULAR | Status: DC | PRN
  Filled 2018-08-10: qty 2

## 2018-08-10 NOTE — Care Management Important Message (Signed)
Important Message  Patient Details  Name: Michael [name] MRN: [id number] Date of Birth: Jan 25, 1961   Medicare Important Message Given:  Yes    Jerric Oyen 08/10/2018, 3:27 PM

## 2018-08-10 NOTE — Progress Notes (Signed)
[facility] NEPHROLOGY PROGRESS NOTE  Assessment/ Plan: Pt is a 57 y.o. yo male ESRD on HD admitted with hypotension, hypothermia and hypoxia. [facility]  4h  300/800  65kg  3K/2.25 bath  LUA AVF   Hep none  - mircera 75 ug every 4 wks  #Septic shock from right lower lobe pneumonia: Now extubated. On cefepime.  # ESRD:HD MWF. On CRRT from 9/20 to 9/23.  Plan for next intermittent hemodialysis on Wednesday.  # Anemia: Hb dropped to 7.5. Iron sat 79 %. Start Aranesp.     # Secondary hyperparathyroidism: Calcium, phosphorus acceptable.  Not on binders  # Hypotension/volume: I increased the dose of midodrine to 10 mg 3 times daily.  He reports chronic hypotension and he is asymptomatic.  #Skin rash: Chronic in nature.  More itching today unknown if patient is allergic to medication.  Per patient he had  skin biopsy by dermatology.  Subjective: Seen and examined.  He was transferred out of ICU.  Denied headache, dizziness, nausea, vomiting, chest pain, shortness breath. Objective Vital signs in last 24 hours: Vitals:   08/09/18 2358 08/10/18 0348 08/10/18 0444 08/10/18 0745  BP: 95/67 (!) 89/66  97/72  Pulse: 96 (!) 108  96  Resp: 18   18  Temp: 98.2 F (36.8 C) 97.6 F (36.4 C)  97.6 F (36.4 C)  TempSrc: Oral Oral  Oral  SpO2: 98% 99%  98%  Weight:   69.8 kg    Weight change:   Intake/Output Summary (Last 24 hours) at 08/10/2018 0920 Last data filed at 08/09/2018 2000 Gross per 24 hour  Intake 698.83 ml  Output -  Net 698.83 ml       Labs: Basic Metabolic Panel: Recent Labs  Lab 08/09/18 0518 08/09/18 1651 08/10/18 0117  NA 136 133* 135  K 3.1* 3.3* 3.6  CL 99 98 100  CO2 25 24 20*  GLUCOSE 125* 144* 112*  BUN 34* 36* 41*  CREATININE 3.04* 3.58* 4.34*  CALCIUM 8.2* 8.5* 8.7*  PHOS 2.9 3.6 4.5   Liver Function Tests: Recent Labs  Lab 08/05/18 1144 08/06/18 1408  08/09/18 0518 08/09/18 1651 08/10/18 0117  AST 24 30  --    --   --   --   ALT 14 16  --   --   --   --   ALKPHOS 163* 120  --   --   --   --   BILITOT 1.0 1.8*  --   --   --   --   PROT 8.6* 7.6  --   --   --   --   ALBUMIN 4.2 3.4*   < > 3.1* 3.0* 3.1*   < > = values in this interval not displayed.   No results for input(s): LIPASE, AMYLASE in the last 168 hours. Recent Labs  Lab 08/06/18 1600  AMMONIA 43*   CBC: Recent Labs  Lab 08/06/18 1408  08/07/18 0349 08/08/18 0444 08/09/18 0518 08/10/18 0117  WBC 6.5  --  10.0 8.7 9.4 8.9  NEUTROABS 4.9  --   --  7.0 5.8 4.8  HGB 10.9*   < > 10.7* 8.3* 7.5* 7.6*  HCT 34.0*   < > 33.0* 25.7* 23.2* 23.7*  MCV 87.6  --  87.5 88.3 88.5 89.1  PLT 118*  --  167 148* 126* 131*   < > = values in this interval not displayed.   Cardiac Enzymes: Recent Labs  Lab  08/06/18 2233 08/07/18 0349 08/07/18 1009 08/07/18 1630  TROPONINI 0.06* 0.05* 0.04* 0.04*   CBG: Recent Labs  Lab 08/09/18 1522 08/09/18 1943 08/09/18 2310 08/10/18 0426 08/10/18 0742  GLUCAP 151* 104* 95 89 86    Iron Studies:  Recent Labs    08/10/18 0117  IRON 212*  TIBC 267  FERRITIN 823*   Studies/Results: No results found.  Medications: Infusions: . sodium chloride Stopped (08/09/18 2000)  . ceFEPime (MAXIPIME) IV    . heparin 1,100 Units/hr (08/10/18 0440)    Scheduled Medications: . atorvastatin  20 mg Oral Daily  . famotidine  20 mg Per Tube Daily  . insulin aspart  0-15 Units Subcutaneous Q4H  . midodrine  10 mg Per Tube TID WC    have reviewed scheduled and prn medications.  Physical Exam: General: Sitting in bed comfortable, not in distress Heart:RRR, s1s2 nl Lungs: Fair bilateral, no crackle Abdomen:soft, Non-tender, non-distended Extremities: No edema Dialysis Access: Left upper extremity AV fistula has good bruit  Chris Cripps Prasad Temesha Queener 08/10/2018,9:20 AM  LOS: 4 days

## 2018-08-10 NOTE — Progress Notes (Signed)
[place] for Heparin Indication: atrial fibrillation  Allergies  Allergen Reactions  . Triamcinolone Other (See Comments)    Felt like skin was burning    Patient Measurements: Weight: 153 lb 14.1 oz (69.8 kg) Heparin Dosing Weight: 73.4kg  Vital Signs: Temp: 97.8 F (36.6 C) (09/24 2033) Temp Source: Oral (09/24 2033) BP: 96/40 (09/24 2033) Pulse Rate: 86 (09/24 2033)  Labs: Recent Labs    08/08/18 1208  08/09/18 0518  08/10/18 0117 08/10/18 1148 08/10/18 1552 08/10/18 2129  HGB  --   --  7.5*  --  7.6*  --   --  7.1*  HCT  --   --  23.2*  --  23.7*  --   --  22.6*  PLT  --   --  126*  --  131*  --   --  143*  LABPROT 20.9*  --  19.3*  --  20.2*  --   --   --   INR 1.82  --  1.64  --  1.75  --   --   --   HEPARINUNFRC  --   --   --   --  0.25* 0.26*  --  0.46  CREATININE  --    < > 3.04*   < > 4.34*  --  5.28* 5.73*   < > = values in this interval not displayed.    Estimated Creatinine Clearance: 12.2 mL/min (A) (by C-G formula based on SCr of 5.73 mg/dL (H)).   Medical History: Past Medical History:  Diagnosis Date  . Anemia of chronic disease   . Asthma    said he does not know  . Chronic kidney disease (CKD), stage IV (severe) ([place])   . Chronic systolic CHF (congestive heart failure) (HCC)    a. prior EF normal; b. Echo 10/16: Mild LVH, EF 30-35%, mitral valve bioprosthesis present without evidence of stenosis or regurgitation, severe LAE, mild RVE with mild to moderately reduced RVSF, moderate RAE  . Cirrhosis ([place])   . Coronary artery disease   . Diabetes mellitus type 2 in obese ([place])   . Diabetes mellitus without complication ([place])   . Dyspnea   . GERD (gastroesophageal reflux disease)   . H/O tricuspid valve repair 2012  . History of echocardiogram    Echo at [facility] in [facility] 4/12: mod LVH, EF 60-65%, mod LAE, tissue MVR ok, mod TR, mod pulmo HTN with RVSP 48 mmHg  . Hypertension   . Obstructive sleep apnea    . Paroxysmal atrial fibrillation (HCC)    s/p Maze procedure at time of MVR in 2011  . Pulmonary HTN ([place])   . 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 Thrombocytopenia ([place])     Assessment: 89 yoM with ESRD on MWF HD presenting with hypotension and altered mental status, admitted for pneumonia and sepsis. Pharmacy has been consulted for heparin dosing. Patient on PTA warfarin for atrial fibrillation (PTA dose unclear, last reported as 3.75mg  daily) and supratherapeutic INR 4.24 at admission with bleeding from IJ HD cath site requiring purse string suture and anticoagulation reversal with vitamin K. Warfarin has been held since admission on 9/20  Heparin level is therapeutic x 1 after rate increase, Hgb down (watch closely)  Goal of Therapy:  Heparin level 0.3-0.7 units/ml Monitor platelets by anticoagulation protocol: Yes   Plan:  Cont heparin infusion at 1150 units/hr Confirmatory heparin level with AM labs Monitor for s/sx bleeding,  Watch Hgb closely   Narda Bonds, PharmD, BCPS Clinical Pharmacist Phone: [phone]

## 2018-08-10 NOTE — Progress Notes (Signed)
Notified [facility] physician of low BP 89/66 and abnormal labs. Orders received. Arlis Porta

## 2018-08-10 NOTE — Progress Notes (Signed)
Physical Therapy Evaluation Patient Details Name: Michael [name] MRN: [id number] DOB: [birth year] Today's Date: 08/10/2018   History of Present Illness  5 yoM, who speaks little English, with extensive PMH significant for but not limited to ESRD on MWF HD, combined HF, CAD, DM, cirrhosis, GERD, HTN, PAF, pulmonary hypertension, s/p MVR on coumadin, and substance abuse, who was sent from HD with hypotension and lethargy.  Clinical Impression  Pt seen for evaluation. Performed ambulation and stair negotiation without physical assistance. Baseline deficits but does not appear to impact ability to mobilize. No recommendation for further PT services . Anticipate pt will be safe to d/c home.     Follow Up Recommendations No PT follow up    Equipment Recommendations  None recommended by PT    Recommendations for Other Services       Precautions / Restrictions Precautions Precautions: Fall Restrictions Weight Bearing Restrictions: No      Mobility  Bed Mobility               General bed mobility comments: Pt in chair upon arrival   Transfers Overall transfer level: Independent               General transfer comment: Pt executed sit to stand without physical assistance  Ambulation/Gait Ambulation/Gait assistance: Min guard Gait Distance (Feet): 150 Feet Assistive device: Straight cane Gait Pattern/deviations: Decreased stride length;Step-through pattern;Narrow base of support Gait velocity: Decreased Gait velocity interpretation: 1.31 - 2.62 ft/sec, indicative of limited community ambulator General Gait Details: Pt ambulated in hallway with min guard for stability and safety.   Stairs Stairs: Yes Stairs assistance: Supervision Stair Management: One rail Left;One rail Right Number of Stairs: 4 General stair comments: Pt negotiated up 2 stairs and down 2 stairs x2.   Wheelchair Mobility    Modified Rankin (Stroke Patients Only)       Balance Overall  balance assessment: Modified Independent(with use of single point cane)                                           Pertinent Vitals/Pain Pain Assessment: No/denies pain    Home Living Family/patient expects to be discharged to:: Private residence Living Arrangements: Other (Comment)(rommate) Available Help at Discharge: Family(daughter) Type of Home: House Home Access: Stairs to enter Entrance Stairs-Rails: Can reach both Entrance Stairs-Number of Steps: 3 Home Layout: One level Home Equipment: Cane - single point      Prior Function Level of Independence: Independent with assistive device(s)         Comments: Single point cane     Hand Dominance   Dominant Hand: Right    Extremity/Trunk Assessment   Upper Extremity Assessment Upper Extremity Assessment: Overall WFL for tasks assessed    Lower Extremity Assessment Lower Extremity Assessment: Overall WFL for tasks assessed       Communication   Communication: Prefers language other than English  Cognition Arousal/Alertness: Awake/alert Behavior During Therapy: WFL for tasks assessed/performed Overall Cognitive Status: Within Functional Limits for tasks assessed Area of Impairment: Following commands                       Following Commands: Follows one step commands inconsistently       General Comments: Pt needed multiple cues during therapy to remain in chair before ambulation.       General Comments  Exercises     Assessment/Plan    PT Assessment Patent does not need any further PT services  PT Problem List         PT Treatment Interventions      PT Goals (Current goals can be found in the Care Plan section)  Acute Rehab PT Goals Patient Stated Goal: To go home PT Goal Formulation: With patient Time For Goal Achievement: 08/24/18 Potential to Achieve Goals: Good    Frequency     Barriers to discharge        Co-evaluation               AM-PAC  PT "6 Clicks" Daily Activity  Outcome Measure Difficulty turning over in bed (including adjusting bedclothes, sheets and blankets)?: None Difficulty moving from lying on back to sitting on the side of the bed? : None Difficulty sitting down on and standing up from a chair with arms (e.g., wheelchair, bedside commode, etc,.)?: None Help needed moving to and from a bed to chair (including a wheelchair)?: A Little Help needed walking in hospital room?: A Little Help needed climbing 3-5 steps with a railing? : A Little 6 Click Score: 21    End of Session Equipment Utilized During Treatment: Gait belt Activity Tolerance: Patient tolerated treatment well Patient left: in chair;with call bell/phone within reach;with chair alarm set Nurse Communication: Mobility status PT Visit Diagnosis: Other abnormalities of gait and mobility (R26.89);Other symptoms and signs involving the nervous system (T05.697)    Time: 0153-0220 PT Time Calculation (min) (ACUTE ONLY): 27 min   Charges:              Alanda Slim, SPT  Shailyn Weyandt 08/10/2018, 3:11 PM

## 2018-08-10 NOTE — Progress Notes (Signed)
[place] for heparin Indication: atrial fibrillation  Allergies  Allergen Reactions  . Triamcinolone Other (See Comments)    Felt like skin was burning    Patient Measurements: Weight: 153 lb 14.1 oz (69.8 kg) Heparin Dosing Weight: 73.4kg  Vital Signs: Temp: 98.2 F (36.8 C) (09/24 1200) Temp Source: Oral (09/24 1200) BP: 101/61 (09/24 1200) Pulse Rate: 90 (09/24 1200)  Labs: Recent Labs    08/07/18 1630  08/08/18 0444 08/08/18 1208  08/09/18 0518 08/09/18 1651 08/10/18 0117 08/10/18 1148  HGB  --    < > 8.3*  --   --  7.5*  --  7.6*  --   HCT  --   --  25.7*  --   --  23.2*  --  23.7*  --   PLT  --   --  148*  --   --  126*  --  131*  --   LABPROT  --   --   --  20.9*  --  19.3*  --  20.2*  --   INR  --   --   --  1.82  --  1.64  --  1.75  --   HEPARINUNFRC  --   --   --   --   --   --   --  0.25* 0.26*  CREATININE 4.38*  --  3.70*  --    < > 3.04* 3.58* 4.34*  --   TROPONINI 0.04*  --   --   --   --   --   --   --   --    < > = values in this interval not displayed.    Estimated Creatinine Clearance: 16.1 mL/min (A) (by C-G formula based on SCr of 4.34 mg/dL (H)).   Medical History: Past Medical History:  Diagnosis Date  . Anemia of chronic disease   . Asthma    said he does not know  . Chronic kidney disease (CKD), stage IV (severe) ([place])   . Chronic systolic CHF (congestive heart failure) (HCC)    a. prior EF normal; b. Echo 10/16: Mild LVH, EF 30-35%, mitral valve bioprosthesis present without evidence of stenosis or regurgitation, severe LAE, mild RVE with mild to moderately reduced RVSF, moderate RAE  . Cirrhosis ([place])   . Coronary artery disease   . Diabetes mellitus type 2 in obese ([place])   . Diabetes mellitus without complication ([place])   . Dyspnea   . GERD (gastroesophageal reflux disease)   . H/O tricuspid valve repair 2012  . History of echocardiogram    Echo at [facility] in [facility] 4/12: mod LVH, EF 60-65%,  mod LAE, tissue MVR ok, mod TR, mod pulmo HTN with RVSP 48 mmHg  . Hypertension   . Obstructive sleep apnea   . Paroxysmal atrial fibrillation (HCC)    s/p Maze procedure at time of MVR in 2011  . Pulmonary HTN ([place])   . 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 Thrombocytopenia ([place])     Assessment: 30 yoM with ESRD on MWF HD presenting with hypotension and altered mental status, admitted for pneumonia and sepsis. Pharmacy has been consulted for heparin dosing. Patient on PTA warfarin for atrial fibrillation (PTA dose unclear, last reported as 3.75mg  daily) and supratherapeutic INR 4.24 at admission with bleeding from IJ HD cath site requiring purse string suture and anticoagulation reversal with vitamin K. Warfarin has been held since admission on  9/20  Heparin level remains slightly subtherapeutic at 0.26 on 1100 units/hr. Hgb is low but stable, plts stable, no further bleeding from IJ site reported. No complications with IV access reported since last replaced.   Goal of Therapy:  Heparin level 0.3-0.7 units/ml Monitor platelets by anticoagulation protocol: Yes   Plan:  Increase heparin infusion to 1150 units/hr Check anti-Xa level in 8 hours and daily while on heparin Continue to monitor H&H and platelets  Monitor for s/sx bleeding F/u plans for outpatient anticoagulation   Juanell Fairly, PharmD PGY1 Pharmacy Resident Phone [phone] 08/10/2018 1:28 PM

## 2018-08-10 NOTE — Progress Notes (Signed)
ANTICOAGULATION CONSULT NOTE - Follow Up Consult  Pharmacy Consult for Coumadin Indication: atrial fibrillation  Allergies  Allergen Reactions  . Triamcinolone Other (See Comments)    Felt like skin was burning    Patient Measurements: Weight: 153 lb 14.1 oz (69.8 kg)  Vital Signs: Temp: 97.9 F (36.6 C) (09/24 1532) Temp Source: Oral (09/24 1532) BP: 95/70 (09/24 1532) Pulse Rate: 87 (09/24 1532)  Labs: Recent Labs    08/08/18 0444 08/08/18 1208  08/09/18 0518 08/09/18 1651 08/10/18 0117 08/10/18 1148 08/10/18 1552  HGB 8.3*  --   --  7.5*  --  7.6*  --   --   HCT 25.7*  --   --  23.2*  --  23.7*  --   --   PLT 148*  --   --  126*  --  131*  --   --   LABPROT  --  20.9*  --  19.3*  --  20.2*  --   --   INR  --  1.82  --  1.64  --  1.75  --   --   HEPARINUNFRC  --   --   --   --   --  0.25* 0.26*  --   CREATININE 3.70*  --    < > 3.04* 3.58* 4.34*  --  5.28*   < > = values in this interval not displayed.    Estimated Creatinine Clearance: 13.3 mL/min (A) (by C-G formula based on SCr of 5.28 mg/dL (H)).   Medications:  Medications Prior to Admission  Medication Sig Dispense Refill Last Dose  . acetaminophen (TYLENOL) 500 MG tablet Take 1,000 mg by mouth daily as needed for headache (pain).    few days ago  . amiodarone (PACERONE) 200 MG tablet TAKE 1 TABLET BY MOUTH TWICE A DAY (Patient taking differently: Take 200 mg by mouth 2 (two) times daily. ) 60 tablet 10 08/06/2018 at am  . atorvastatin (LIPITOR) 20 MG tablet TAKE 1 TABLET (20 MG TOTAL) BY MOUTH DAILY. PLEASE KEEP UPCOMING APPT. THANK YOU 90 tablet 1 08/06/2018 at am  . calcium acetate (PHOSLO) 667 MG capsule Take 1 capsule (667 mg total) by mouth 3 (three) times daily with meals. Please keep upcoming appt. Thank You 90 capsule 0 08/06/2018 at am  . colchicine 0.6 MG tablet Take 0.5 tablets (0.3 mg total) by mouth 2 (two) times a week. (Patient taking differently: Take 0.3 mg by mouth daily as needed (gout  flares). ) 30 tablet 0 1-2 months ago  . diphenhydramine-acetaminophen (TYLENOL PM) 25-500 MG TABS tablet Take 1 tablet by mouth at bedtime as needed (pain).   1-2 weeks ago  . fluticasone (FLONASE) 50 MCG/ACT nasal spray Place 2 sprays into both nostrils daily. (Patient taking differently: Place 2 sprays into both nostrils daily as needed (congestion). ) 16 g 0 month ago  . folic acid (FOLVITE) 1 MG tablet TAKE 1 TABLET BY MOUTH EVERY DAY (Patient taking differently: Take 1 mg by mouth daily. ) 90 tablet 0 08/06/2018 at am  . gabapentin (NEURONTIN) 100 MG capsule Take 200 mg by mouth at bedtime. FOR LEG PAIN   08/05/2018 at pm  . metoprolol tartrate (LOPRESSOR) 25 MG tablet Take 1 tablet (25 mg total) by mouth 2 (two) times daily. 180 tablet 3 08/06/2018 at 930  . midodrine (PROAMATINE) 5 MG tablet Take 5 mg by mouth 3 (three) times daily with meals.    08/06/2018 at am  . multivitamin (  RENA-VIT) TABS tablet Take 1 tablet by mouth at bedtime. 30 tablet 0 08/05/2018 at pm  . OXYGEN Inhale 2 L into the lungs daily as needed (shortness of breath).    08/05/2018  . tiZANidine (ZANAFLEX) 4 MG tablet Take 1 tablet (4 mg total) by mouth every 8 (eight) hours as needed for muscle spasms. 60 tablet 1 08/06/2018 at am  . warfarin (COUMADIN) 2.5 MG tablet Take 1.5 tablets daily or as directed by Coumadin clinic (Patient taking differently: Take 3.75 mg by mouth daily. or as directed by Coumadin clinic) 50 tablet 0 08/04/2018 at 930  . diazepam (VALIUM) 5 MG tablet Take 5 mg by mouth at bedtime as needed (sleep).   week ago  . hydrOXYzine (ATARAX/VISTARIL) 25 MG tablet 1/2-1 tablets tid prn itching (Patient not taking: Reported on 08/09/2018) 30 tablet 1 Not Taking at Unknown time  . omeprazole (PRILOSEC) 20 MG capsule Take 1 capsule (20 mg total) by mouth daily. (Patient not taking: Reported on 08/09/2018) 30 capsule 5 Not Taking at Unknown time  . traMADol (ULTRAM) 50 MG tablet Take 1 tablet (50 mg total) by mouth every  12 (twelve) hours as needed. (Patient not taking: Reported on 08/09/2018) 10 tablet 0 Not Taking at Unknown time    Assessment: 57 yo M on Coumadin PTA for hx afib.  Coumadin has been held and pt has been receiving IV heparin since 9/20.  To restart Coumadin today.  INR subtherapeutic, as expected, after doses held and Vit K administration.  PTA Coumadin dose = 3.75mg  daily (1.5 of 2.5mg  tablet)  Goal of Therapy:  INR 2-3 Monitor platelets by anticoagulation protocol: Yes   Plan:  Coumadin 5mg  PO x 1 tonight Continue daily INR  [employer], Pharm.D., BCPS Clinical Pharmacist Pager: [phone] Clinical phone for 08/10/2018 is [phone].  **Pharmacist phone directory can now be found on [email or web address] ([email or web address]).  Listed under [place].  08/10/2018 6:17 PM

## 2018-08-10 NOTE — Progress Notes (Signed)
[facility] Progress Note Patient Name: Michael [name] DOB: 1961/11/15 MRN: [id number]   Date of Service  08/10/2018  HPI/Events of Note  Hypotension - BP = 89/66. No CVL or CVP. LVEF = 55% to 60%  eICU Interventions  Will bolus with 0.9 NaCl 1 liter IV over 1 hour now.      Intervention Category Major Interventions: Hypotension - evaluation and management  Latriece Anstine Eugene 08/10/2018, 4:22 AM

## 2018-08-10 NOTE — Progress Notes (Signed)
Nutrition Follow-up  DOCUMENTATION CODES:   Not applicable  INTERVENTION:  Provide Nepro Shake po BID, each supplement provides 425 kcal and 19 grams protein.  Encourage adequate PO intake.   NUTRITION DIAGNOSIS:   Inadequate oral intake related to inability to eat as evidenced by NPO status; diet advanced; improving  GOAL:   Patient will meet greater than or equal to 90% of their needs; progressing  MONITOR:   PO intake, Supplement acceptance, Labs, Weight trends, I & O's, Skin  REASON FOR ASSESSMENT:   Ventilator, Consult Enteral/tube feeding initiation and management  ASSESSMENT:   57 year old male on HD MWF with last treatment on 9/18. He showed up to dialysis on 9/20 and was found to be altered lethargic and hypotensive. He was sent to the ED for further evaluation. Patient was subsequently intubated in the ED and initiated on CRRT on 9/20 around 11:00 PM. Extubated 9/22.  Meal completion has been 50%. Pt reports having a good appetite currently and PTA with usual consumption of at least 3 meals a day. RD to order Nepro shake to aid in caloric and protein needs. Labs and medications reviewed.   Diet Order:   Diet Order            Diet renal/carb modified with fluid restriction Diet-HS Snack? Nothing; Fluid restriction: 1200 mL Fluid; Room service appropriate? Yes; Fluid consistency: Thin  Diet effective now              EDUCATION NEEDS:   No education needs have been identified at this time  Skin:  Skin Assessment: Reviewed RN Assessment  Last BM:  9/22  Height:   Ht Readings from Last 1 Encounters:  08/05/18 5\' 5"  (1.651 m)    Weight:   Wt Readings from Last 1 Encounters:  08/10/18 69.8 kg    Ideal Body Weight:  61.82 kg  BMI:  Body mass index is 25.61 kg/m.  Estimated Nutritional Needs:   Kcal:  1900-2100  Protein:  95-110 grams  Fluid:  1.2 L/day    Corrin Parker, MS, RD, LDN Pager # [phone] After hours/ weekend pager #  [phone]

## 2018-08-10 NOTE — Progress Notes (Signed)
PROGRESS NOTE  Michael [name]  BWG:[id number] DOB: 04-08-1961 DOA: 08/06/2018 PCP: Michael Rakes, MD   Brief Narrative: Michael [name] is a 57 y.o. 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 He was intubated for desaturation and airway protection with CXR demonstrating RLL infiltrate. He was treated for septic shock and CRRT initiated.   Assessment & Plan: Active Problems:   Acute encephalopathy  Septic shock due to RLL pneumonia:  - Continue cefepime, remains afebrile with no leukocytosis. Blood cultures from admission NGTD  Hypotension: Ongoing. Required bolus 1L last night and increase in midodrine during this hospitalization.  - Continue midodrine  Acute hypoxic respiratory failure: Resolved, extubated 9/22.   ESRD: s/p CRRT during shock in ICU. Now plan to return to intermittent HD MWF on schedule 9/25.  - If BP stable with dialysis 9/25, may be able to discharge. Ok per nephrology to pull temp catheter.   Chronic combined CHF: EF here improved to EF 55-60%, mild LVH, no WMA's, mild stenosis on poorly visualized mitral valve, mod AR.  - Volume per HD  Leukocytoclastic vasculitis with purpuric rash: Improving per patient without directed therapy.  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  Anemia of chronic disease:  - Monitor CBC, may need ESA. Stable but low.   Acute encephalopathy: Resolved.  DVT prophylaxis: Heparin IV Code Status: Full Family Communication: None at bedside Disposition Plan: Home once BP stable.    Consultants:   Nephrology  PCCM  Procedures:   ETT 9/20 - 9/22  Right IJ HD cath 9/20 - 9/24  Antimicrobials:  Vancomycin 9/20 - 9/22  Metronidazole 9/20 - 9/21  Cefepime 9/20 >>    Subjective: Breathing back to baseline, feels light headed when standing but hasn't gotten up to walk around yet. No fever.   Objective: Vitals:   08/10/18 0444 08/10/18 0745 08/10/18 1200 08/10/18 1532  BP:  97/72 101/61 95/70  Pulse:  96 90 87  Resp:  18 18   Temp:  97.6 F (36.4 C) 98.2 F (36.8 C) 97.9 F (36.6 C)  TempSrc:  Oral Oral Oral  SpO2:  98% 100% 97%  Weight: 69.8 kg       Intake/Output Summary (Last 24 hours) at 08/10/2018 1740 Last data filed at 08/10/2018 1130 Gross per 24 hour  Intake 308.86 ml  Output -  Net 308.86 ml   Filed Weights   08/07/18 0500 08/08/18 0449 08/10/18 0444  Weight: 80.1 kg 73.4 kg 69.8 kg    Gen: 56 y.o. male in no distress Pulm: Non-labored breathing. Clear to auscultation bilaterally.  CV: Rapid irreg, rate ~100-105bpm. No murmur, rub, or gallop. No JVD, no pedal edema. GI: Abdomen soft, non-tender, non-distended, with normoactive bowel sounds. No organomegaly or masses felt. Ext: Warm, no deformities Skin: Right IJ cath without bleeding, erythema, tenderness. Lower legs and hands with nonpalpable purpura/hyperpigmentation, nontender. Neuro: Alert and oriented. No focal neurological deficits. Psych: Judgement and insight appear normal. Mood & affect appropriate.   Data Reviewed: I have personally reviewed following labs and imaging studies  CBC: Recent Labs  Lab 08/06/18 1408 08/06/18 1751 08/07/18 0349 08/08/18 0444  08/09/18 0518 08/10/18 0117  WBC 6.5  --  10.0 8.7 9.4 8.9  NEUTROABS 4.9  --   --  7.0 5.8 4.8  HGB 10.9* 11.6* 10.7* 8.3* 7.5* 7.6*  HCT 34.0* 34.0* 33.0* 25.7* 23.2* 23.7*  MCV 87.6  --  87.5 88.3 88.5 89.1  PLT 118*  --  167 148* 126* 329*   Basic Metabolic Panel: Recent Labs  Lab 08/07/18 1150  08/07/18 1630 08/08/18 0444 08/08/18 1606 08/08/18 1607 08/09/18 0518 08/09/18 1651 08/10/18 0117 08/10/18 1552  NA  --  138 134* 134*  --  136 133* 135 133*  K  --  5.1 4.2 4.0  --  3.1* 3.3* 3.6 3.6  CL  --  103 100 99  --  99 98 100 98  CO2  --  21* 20* 20*  --  25 24 20* 22  GLUCOSE  --  140* 139* 193*  --  125* 144* 112* 101*  BUN  --  50* 45* 43*  --  34* 36* 41* 47*  CREATININE  --  4.38* 3.70* 3.71*  --  3.04* 3.58* 4.34* 5.28*  CALCIUM  --  8.5* 8.3* 7.9*  --  8.2* 8.5* 8.7* 8.7*  MG 2.3 2.6* 2.5*  --  2.5*  --   --  2.5*  --   PHOS 4.7* 4.8*  4.9* 4.9* 4.6 4.4 2.9 3.6 4.5 4.7*   GFR: Estimated Creatinine Clearance: 13.3 mL/min (A) (by C-G formula based on SCr of 5.28 mg/dL (H)). Liver Function Tests: Recent Labs  Lab 08/05/18 1144 08/06/18 1408  08/08/18 1606 08/09/18 0518 08/09/18 1651 08/10/18 0117 08/10/18 1552  AST 24 30  --   --   --   --   --   --   ALT 14 16  --   --   --   --   --   --   ALKPHOS 163* 120  --   --   --   --   --   --   BILITOT 1.0 1.8*  --   --   --   --   --   --   PROT 8.6* 7.6  --   --   --   --   --   --   ALBUMIN 4.2 3.4*   < > 2.9* 3.1* 3.0* 3.1* 2.9*   < > = values in this interval not displayed.   No results for input(s): LIPASE, AMYLASE in the last 168 hours. Recent Labs  Lab 08/06/18 1600  AMMONIA 43*   Coagulation Profile: Recent Labs  Lab 08/06/18 1408 08/07/18 0349 08/08/18 1208 08/09/18 0518 08/10/18 0117  INR 4.24* 2.24 1.82 1.64 1.75   Cardiac Enzymes: Recent Labs  Lab 08/06/18 2233 08/07/18 0349 08/07/18 1009 08/07/18 1630  TROPONINI 0.06* 0.05* 0.04* 0.04*   BNP (last 3 results) No results for input(s): PROBNP in the last 8760 hours. HbA1C: No results for input(s): HGBA1C in the last 72 hours. CBG: Recent Labs  Lab 08/09/18 2310 08/10/18 0426 08/10/18 0742 08/10/18 1157 08/10/18 1621  GLUCAP 95 89 86 157* 94   Lipid Profile: No results for input(s): CHOL, HDL, LDLCALC, TRIG, CHOLHDL,  LDLDIRECT in the last 72 hours. Thyroid Function Tests: No results for input(s): TSH, T4TOTAL, FREET4, T3FREE, THYROIDAB in the last 72 hours. Anemia Panel: Recent Labs    08/10/18 0117  FERRITIN 823*  TIBC 267  IRON 212*   Urine analysis:    Component Value Date/Time  COLORURINE AMBER (A) 07/25/2017 1806   APPEARANCEUR HAZY (A) 07/25/2017 1806   LABSPEC 1.018 07/25/2017 1806   PHURINE 5.0 07/25/2017 1806   GLUCOSEU NEGATIVE 07/25/2017 1806   HGBUR MODERATE (A) 07/25/2017 1806   BILIRUBINUR NEGATIVE 07/25/2017 1806   KETONESUR NEGATIVE 07/25/2017 1806   PROTEINUR 100 (A) 07/25/2017 1806   NITRITE NEGATIVE 07/25/2017 1806   LEUKOCYTESUR NEGATIVE 07/25/2017 1806   Recent Results (from the past 240 hour(s))  Culture, blood (Routine x 2)     Status: None (Preliminary result)   Collection Time: 08/06/18  2:08 PM  Result Value Ref Range Status   Specimen Description BLOOD BLOOD RIGHT WRIST  Final   Special Requests   Final    BOTTLES DRAWN AEROBIC AND ANAEROBIC Blood Culture adequate volume   Culture   Final    NO GROWTH 4 DAYS Performed at [facility], [place] [address], [place], [place] [address]    Report Status PENDING  Incomplete  Culture, blood (Routine x 2)     Status: None (Preliminary result)   Collection Time: 08/06/18  2:08 PM  Result Value Ref Range Status   Specimen Description BLOOD RIGHT ANTECUBITAL  Final   Special Requests   Final    BOTTLES DRAWN AEROBIC AND ANAEROBIC Blood Culture adequate volume   Culture   Final    NO GROWTH 4 DAYS Performed at [facility], [place] [address], [place], [place] [address]    Report Status PENDING  Incomplete  MRSA PCR Screening     Status: None   Collection Time: 08/06/18  6:38 PM  Result Value Ref Range Status   MRSA by PCR NEGATIVE NEGATIVE Final    Comment:        The GeneXpert MRSA Assay (FDA approved for NASAL specimens only), is one component of a comprehensive MRSA colonization surveillance  program. It is not intended to diagnose MRSA infection nor to guide or monitor treatment for MRSA infections. Performed at [facility], [place] [address] [place], [place] [address]       Radiology Studies: No results found.  Scheduled Meds: . atorvastatin  20 mg Oral Daily  . [START ON 08/11/2018] Chlorhexidine Gluconate Cloth  6 each Topical Q0600  . [START ON 08/11/2018] darbepoetin (ARANESP) injection - DIALYSIS  100 mcg Intravenous Q Wed-HD  . famotidine  20 mg Per Tube Daily  . feeding supplement (NEPRO CARB STEADY)  237 mL Oral BID BM  . insulin aspart  0-15 Units Subcutaneous Q4H  . midodrine  10 mg Per Tube TID WC   Continuous Infusions: . sodium chloride Stopped (08/09/18 2000)  . ceFEPime (MAXIPIME) IV 1 g (08/10/18 1723)  . heparin 1,150 Units/hr (08/10/18 1348)     LOS: 4 days   Time spent: 35 minutes.  Patrecia Pour, MD [facility] [email or web address] Password [facility] 08/10/2018, 5:40 PM

## 2018-08-10 NOTE — Progress Notes (Signed)
Per MD order, HD central line removed. IV cathter intact. Vaseline pressure gauze to site, pressure held x 56min, due to bleeding. Pt is on heparin infusion. Pt instructed not to get out of bed for 60 min after the removal of the central line due to prolong bleeding. Instucted to keep dressing CDI x 24hours, if bleeding occurs hold pressure and call nurse. [employer]

## 2018-08-10 NOTE — Progress Notes (Signed)
ANTICOAGULATION CONSULT NOTE - Follow Up Consult  Pharmacy Consult for heparin Indication: atrial fibrillation  Labs: Recent Labs    08/07/18 0349 08/07/18 1009 08/07/18 1630 08/08/18 0444 08/08/18 1208  08/09/18 0518 08/09/18 1651 08/10/18 0117  HGB 10.7*  --   --  8.3*  --   --  7.5*  --  7.6*  HCT 33.0*  --   --  25.7*  --   --  23.2*  --  23.7*  PLT 167  --   --  148*  --   --  126*  --  131*  LABPROT 24.6*  --   --   --  20.9*  --  19.3*  --  20.2*  INR 2.24  --   --   --  1.82  --  1.64  --  1.75  HEPARINUNFRC  --   --   --   --   --   --   --   --  0.25*  CREATININE 6.04*  --  4.38* 3.70*  --    < > 3.04* 3.58* 4.34*  TROPONINI 0.05* 0.04* 0.04*  --   --   --   --   --   --    < > = values in this interval not displayed.    Assessment: 57yo male slightly subtherapeutic on heparin with initial dosing while Coumadin held; after lab drawn pt's IV fell out after pt rolled over on it and pt bled heavily, now controlled and awaiting new IV placement.  Goal of Therapy:  Heparin level 0.3-0.7 units/ml   Plan:  Will increase heparin gtt slightly to 1100 units/hr and check level in 8 hours.    Wynona Neat, PharmD, BCPS  08/10/2018,2:55 AM

## 2018-08-11 DIAGNOSIS — R6521 Severe sepsis with septic shock: Secondary | ICD-10-CM

## 2018-08-11 DIAGNOSIS — A419 Sepsis, unspecified organism: Secondary | ICD-10-CM

## 2018-08-11 LAB — CULTURE, BLOOD (ROUTINE X 2)
Culture: NO GROWTH
Culture: NO GROWTH
Special Requests: ADEQUATE
Special Requests: ADEQUATE

## 2018-08-11 LAB — URINALYSIS, ROUTINE W REFLEX MICROSCOPIC
Bilirubin Urine: NEGATIVE
GLUCOSE, UA: NEGATIVE mg/dL
Ketones, ur: 5 mg/dL — AB
NITRITE: NEGATIVE
PH: 5 (ref 5.0–8.0)
PROTEIN: 100 mg/dL — AB
SPECIFIC GRAVITY, URINE: 1.021 (ref 1.005–1.030)
WBC, UA: >50 WBC/hpf — ABNORMAL HIGH (ref 0–5)

## 2018-08-11 LAB — RENAL FUNCTION PANEL
ALBUMIN: 3 g/dL — AB (ref 3.5–5.0)
ANION GAP: 15 (ref 5–15)
BUN: 54 mg/dL — ABNORMAL HIGH (ref 6–20)
CALCIUM: 8.6 mg/dL — AB (ref 8.9–10.3)
CO2: 17 mmol/L — ABNORMAL LOW (ref 22–32)
Chloride: 99 mmol/L (ref 98–111)
Creatinine, Ser: 6.08 mg/dL — ABNORMAL HIGH (ref 0.61–1.24)
GFR calc non Af Amer: 9 mL/min — ABNORMAL LOW (ref >60)
GFR, EST AFRICAN AMERICAN: 11 mL/min — AB (ref >60)
Glucose, Bld: 85 mg/dL (ref 70–99)
PHOSPHORUS: 5.1 mg/dL — AB (ref 2.5–4.6)
Potassium: 4 mmol/L (ref 3.5–5.1)
Sodium: 131 mmol/L — ABNORMAL LOW (ref 135–145)

## 2018-08-11 LAB — CBC WITH DIFFERENTIAL/PLATELET
Abs Immature Granulocytes: 0.1 10*3/uL (ref 0.0–0.1)
BASOS ABS: 0.1 10*3/uL (ref 0.0–0.1)
BASOS PCT: 1 %
Eosinophils Absolute: 0.4 10*3/uL (ref 0.0–0.7)
Eosinophils Relative: 5 %
HCT: 22.4 % — ABNORMAL LOW (ref 39.0–52.0)
Hemoglobin: 7.3 g/dL — ABNORMAL LOW (ref 13.0–17.0)
Immature Granulocytes: 1 %
Lymphocytes Relative: 25 %
Lymphs Abs: 2.3 10*3/uL (ref 0.7–4.0)
MCH: 29 pg (ref 26.0–34.0)
MCHC: 32.6 g/dL (ref 30.0–36.0)
MCV: 88.9 fL (ref 78.0–100.0)
Monocytes Absolute: 1.6 10*3/uL — ABNORMAL HIGH (ref 0.1–1.0)
Monocytes Relative: 17 %
Neutro Abs: 4.7 10*3/uL (ref 1.7–7.7)
Neutrophils Relative %: 51 %
PLATELETS: 142 10*3/uL — AB (ref 150–400)
RBC: 2.52 MIL/uL — ABNORMAL LOW (ref 4.22–5.81)
RDW: 17.3 % — AB (ref 11.5–15.5)
WBC: 9.2 10*3/uL (ref 4.0–10.5)

## 2018-08-11 LAB — GLUCOSE, CAPILLARY
Glucose-Capillary: 63 mg/dL — ABNORMAL LOW (ref 70–99)
Glucose-Capillary: 73 mg/dL (ref 70–99)
Glucose-Capillary: 80 mg/dL (ref 70–99)
Glucose-Capillary: 93 mg/dL (ref 70–99)

## 2018-08-11 LAB — PREPARE RBC (CROSSMATCH)

## 2018-08-11 LAB — PROTIME-INR
INR: 1.65
Prothrombin Time: 19.4 seconds — ABNORMAL HIGH (ref 11.4–15.2)

## 2018-08-11 LAB — HEPARIN LEVEL (UNFRACTIONATED): Heparin Unfractionated: 0.45 IU/mL (ref 0.30–0.70)

## 2018-08-11 MED ORDER — SODIUM CHLORIDE 0.9% IV SOLUTION
Freq: Once | INTRAVENOUS | Status: AC
  Administered 2018-08-11: 18:00:00 via INTRAVENOUS

## 2018-08-11 MED ORDER — DARBEPOETIN ALFA 100 MCG/0.5ML IJ SOSY
PREFILLED_SYRINGE | INTRAMUSCULAR | Status: AC
  Filled 2018-08-11: qty 0.5

## 2018-08-11 MED ORDER — MIDODRINE HCL 5 MG PO TABS
ORAL_TABLET | ORAL | Status: AC
  Administered 2018-08-11: 10 mg
  Filled 2018-08-11: qty 2

## 2018-08-11 MED ORDER — SODIUM CHLORIDE 0.9 % IV BOLUS
500.0000 mL | Freq: Once | INTRAVENOUS | Status: AC
  Administered 2018-08-11: 500 mL via INTRAVENOUS

## 2018-08-11 MED ORDER — METOPROLOL TARTRATE 12.5 MG HALF TABLET
12.5000 mg | ORAL_TABLET | Freq: Two times a day (BID) | ORAL | Status: DC
  Administered 2018-08-11: 12.5 mg via ORAL
  Filled 2018-08-11: qty 1

## 2018-08-11 MED ORDER — METOPROLOL TARTRATE 12.5 MG HALF TABLET: 12.5000 mg | ORAL_TABLET | Freq: Two times a day (BID) | ORAL | Status: DC

## 2018-08-11 MED ORDER — WARFARIN SODIUM 5 MG PO TABS
5.0000 mg | ORAL_TABLET | Freq: Once | ORAL | Status: AC
  Administered 2018-08-11: 5 mg via ORAL
  Filled 2018-08-11: qty 1

## 2018-08-11 NOTE — Progress Notes (Addendum)
[place] for heparin Indication: atrial fibrillation  Allergies  Allergen Reactions  . Triamcinolone Other (See Comments)    Felt like skin was burning    Patient Measurements: Weight: 161 lb 9.6 oz (73.3 kg) Heparin Dosing Weight: 73.4kg  Vital Signs: Temp: 97.7 F (36.5 C) (09/25 0359) Temp Source: Oral (09/25 0359) BP: 90/67 (09/25 0359) Pulse Rate: 121 (09/25 0359)  Labs: Recent Labs    08/09/18 0518  08/10/18 0117 08/10/18 1148 08/10/18 1552 08/10/18 2129 08/11/18 0308  HGB 7.5*  --  7.6*  --   --  7.1* 7.3*  HCT 23.2*  --  23.7*  --   --  22.6* 22.4*  PLT 126*  --  131*  --   --  143* 142*  LABPROT 19.3*  --  20.2*  --   --   --  19.4*  INR 1.64  --  1.75  --   --   --  1.65  HEPARINUNFRC  --    < > 0.25* 0.26*  --  0.46 0.45  CREATININE 3.04*   < > 4.34*  --  5.28* 5.73* 6.08*   < > = values in this interval not displayed.    Estimated Creatinine Clearance: 11.5 mL/min (A) (by C-G formula based on SCr of 6.08 mg/dL (H)).   Medical History: Past Medical History:  Diagnosis Date  . Anemia of chronic disease   . Asthma    said he does not know  . Chronic kidney disease (CKD), stage IV (severe) ([place])   . Chronic systolic CHF (congestive heart failure) (HCC)    a. prior EF normal; b. Echo 10/16: Mild LVH, EF 30-35%, mitral valve bioprosthesis present without evidence of stenosis or regurgitation, severe LAE, mild RVE with mild to moderately reduced RVSF, moderate RAE  . Cirrhosis ([place])   . Coronary artery disease   . Diabetes mellitus type 2 in obese ([place])   . Diabetes mellitus without complication ([place])   . Dyspnea   . GERD (gastroesophageal reflux disease)   . H/O tricuspid valve repair 2012  . History of echocardiogram    Echo at [facility] 4/12: mod LVH, EF 60-65%, mod LAE, tissue MVR ok, mod TR, mod pulmo HTN with RVSP 48 mmHg  . Hypertension   . Obstructive sleep apnea   . Paroxysmal atrial  fibrillation (HCC)    s/p Maze procedure at time of MVR in 2011  . Pulmonary HTN ([place])   . 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 Thrombocytopenia ([place])     Assessment: 37 yoM with ESRD on MWF HD presenting with hypotension and altered mental status, admitted for pneumonia and sepsis. Pharmacy has been consulted for heparin dosing. Patient on PTA warfarin for atrial fibrillation (PTA dose unclear, last reported as 3.75mg  daily) and supratherapeutic INR 4.24 at admission with bleeding from IJ HD cath site requiring purse string suture and anticoagulation reversal with vitamin K. Warfarin has been held since admission on 9/20  Heparin level remains subtherapeutic this morning at 0.45 on 1150 units/hr. Hgb is low but slightly trending upward, plts stable, no further bleeding from IJ site reported. No complications with IV access reported since last replaced.   Warfarin therapy restarted 9/24. INR subtherapeutic today at 1.65 as expected with held doses and Vit K reversal.  Goal of Therapy:  INR goal 2-3 Heparin level 0.3-0.7 units/ml Monitor platelets by anticoagulation protocol: Yes   Plan:  Warfarin 5mg   PO x1 again tonight Continue heparin infusion at 1150 units/hr Monitor daily INR, CBC, heparin level, and s/sx of bleeding Monitor Hgb trend closely    Juanell Fairly, PharmD PGY1 Pharmacy Resident Phone [phone] 08/11/2018 7:41 AM

## 2018-08-11 NOTE — Consult Note (Signed)
   [facility] CM Inpatient Consult   08/11/2018  Michael [name] [birth year] [id number]    Patient screened for potential [facility] Care Management program due to unplanned readmission risk score of 41% (extreme). He has been active with [facility] in the past.  Went by room to speak with patient about Central Management re-engagement. However, he was off the unit in HD.  Made inpatient RNCM aware of attempted visit.   [name], MSN-Ed, RN,BSN [facility] Liaison [phone]

## 2018-08-11 NOTE — Progress Notes (Signed)
Report given to hemo nurse Carla Drape RN via floor nurse Wynelle Beckmann stating pt. Was in A-fib sustaining in the 120s. This Nurse called to follow up, this nurse spoke with Wynelle Beckmann stating pt. Is now with a HR 100-120 non sustaining. Nurse states she did not notify MD yet but will. This nurse notified Dr. Carolin Sicks. Orders to run pt. tx second shift. Charge nurse aware

## 2018-08-11 NOTE — Progress Notes (Signed)
[facility] NEPHROLOGY PROGRESS NOTE  Assessment/ Plan: Pt is a 57 y.o. yo male ESRD on HD admitted with hypotension, hypothermia and hypoxia. Dialysis: [facility]  4h  300/800  65kg  3K/2.25 bath  LUA AVF   Hep none  - mircera 75 ug every 4 wks  #Septic shock from right lower lobe pneumonia: Now extubated. On cefepime.  # ESRD:HD MWF. CRRT from 9/20 to 9/23.  Plan for HD today, 3 K bath.   # Anemia: Hb dropped to 7.3. He had tachycardia this morning, ordered a unit of pRBC transfusion today. Iron sat 79 %. Started Aranesp.     # Secondary hyperparathyroidism: monitor Calcium, phosphorus .  Not on binders  # Hypotension/volume: I increased the dose of midodrine to 10 mg 3 times daily.  He reports chronic hypotension and he is asymptomatic.  # Afib with RVR: resume metoprolol. The HR is better now. On amiodarone. On coumdain, INR 1.6  #Skin rash: Chronic in nature.  More itching today unknown if patient is allergic to medication.  Per patient he had  skin biopsy by dermatology.  Discussed with primary team.  Subjective: Seen and examined.  Tachycardia noted last night and this morning.  He currently reports feeling good.  Denied headache, dizziness, chest pain or shortness of breath. Objective Vital signs in last 24 hours: Vitals:   08/11/18 0001 08/11/18 0359 08/11/18 0500 08/11/18 0822  BP: 106/72 90/67  108/64  Pulse: 95 (!) 121  81  Resp: 20 19  18   Temp: 98 F (36.7 C) 97.7 F (36.5 C)  97.7 F (36.5 C)  TempSrc: Oral Oral  Oral  SpO2: 97% 98%  98%  Weight:   73.3 kg    Weight change: 3.5 kg  Intake/Output Summary (Last 24 hours) at 08/11/2018 0959 Last data filed at 08/11/2018 0500 Gross per 24 hour  Intake 608 ml  Output -  Net 608 ml       Labs: Basic Metabolic Panel: Recent Labs  Lab 08/10/18 1552 08/10/18 2129 08/11/18 0308  NA 133* 132* 131*  K 3.6 4.0 4.0  CL 98 99 99  CO2 22 19* 17*  GLUCOSE 101* 91 85  BUN 47* 51* 54*   CREATININE 5.28* 5.73* 6.08*  CALCIUM 8.7* 8.7* 8.6*  PHOS 4.7* 4.7* 5.1*   Liver Function Tests: Recent Labs  Lab 08/05/18 1144 08/06/18 1408  08/10/18 1552 08/10/18 2129 08/11/18 0308  AST 24 30  --   --   --   --   ALT 14 16  --   --   --   --   ALKPHOS 163* 120  --   --   --   --   BILITOT 1.0 1.8*  --   --   --   --   PROT 8.6* 7.6  --   --   --   --   ALBUMIN 4.2 3.4*   < > 2.9* 3.0* 3.0*   < > = values in this interval not displayed.   No results for input(s): LIPASE, AMYLASE in the last 168 hours. Recent Labs  Lab 08/06/18 1600  AMMONIA 43*   CBC: Recent Labs  Lab 08/08/18 0444 08/09/18 0518 08/10/18 0117 08/10/18 2129 08/11/18 0308  WBC 8.7 9.4 8.9 9.9 9.2  NEUTROABS 7.0 5.8 4.8  --  4.7  HGB 8.3* 7.5* 7.6* 7.1* 7.3*  HCT 25.7* 23.2* 23.7* 22.6* 22.4*  MCV 88.3 88.5 89.1 90.0 88.9  PLT 148* 126*  131* 143* 142*   Cardiac Enzymes: Recent Labs  Lab 08/06/18 2233 08/07/18 0349 08/07/18 1009 08/07/18 1630  TROPONINI 0.06* 0.05* 0.04* 0.04*   CBG: Recent Labs  Lab 08/10/18 1621 08/10/18 2031 08/11/18 0000 08/11/18 0358 08/11/18 0819  GLUCAP 94 109* 93 80 73    Iron Studies:  Recent Labs    08/10/18 0117  IRON 212*  TIBC 267  FERRITIN 823*   Studies/Results: No results found.  Medications: Infusions: . sodium chloride Stopped (08/09/18 2000)  . sodium chloride    . sodium chloride    . ceFEPime (MAXIPIME) IV 1 g (08/10/18 1723)  . heparin 1,150 Units/hr (08/11/18 0303)    Scheduled Medications: . sodium chloride   Intravenous Once  . amiodarone  200 mg Oral BID  . atorvastatin  20 mg Oral Daily  . Chlorhexidine Gluconate Cloth  6 each Topical Q0600  . darbepoetin (ARANESP) injection - DIALYSIS  100 mcg Intravenous Q Wed-HD  . famotidine  20 mg Per Tube Daily  . feeding supplement (NEPRO CARB STEADY)  237 mL Oral BID BM  . insulin aspart  0-15 Units Subcutaneous Q4H  . metoprolol tartrate  12.5 mg Oral BID  . midodrine  10  mg Per Tube TID WC  . warfarin  5 mg Oral ONCE-1800  . Warfarin - Pharmacist Dosing Inpatient   Does not apply q1800    have reviewed scheduled and prn medications.  Physical Exam: General: Sitting in bed comfortable, not in distress Heart: Regular rate rhythm, S1-S2 normal Lungs: Clear bilateral, no crackles Abdomen:soft, Non-tender, non-distended Extremities: No edema Dialysis Access: Left upper extremity AV fistula has good bruit  Dron Prasad Bhandari 08/11/2018,9:59 AM  LOS: 5 days

## 2018-08-11 NOTE — Progress Notes (Signed)
PROGRESS NOTE    Michael [name] Memos  XOV:[id number] DOB: 1961-11-09 DOA: 08/06/2018 PCP: Charlott Rakes, MD   Brief Narrative:  Michael [name] is Michael [name] 57 y.o. 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 He was intubated for desaturation and airway protection with CXR demonstrating RLL infiltrate. He was treated for septic shock and CRRT initiated.    Assessment & Plan:   Active Problems:   Acute encephalopathy   Septic shock due to RLL pneumonia:  - Continue cefepime, remains afebrile with no leukocytosis. Blood cultures from admission NGTD  Hypotension:  Pt with worsening hypotension which occurred about 15 minutes after blood transfusion started.  Pt asymptomatic throughout.  Transfusion stopped.  Will eval for transfusion reaction.  Bolus 500 cc and reevaluate.  SBP dropped to 70's, but has improved to 80's with initiating bolus and trendelenburg. - MAP goal 65, continue to follow after bolus  - Continue midodrine  Anemia of chronic disease:  Hb 7.3 today, stable from yesterday, but with hypotension, 1 unit pRBC ordered per nephrology. - Monitor CBC, may need ESA. Stable but low.   Acute hypoxic respiratory failure: Resolved, extubated 9/22.   ESRD: s/p CRRT during shock in ICU. Now plan to return to intermittent HD MWF on schedule 9/25.  - If BP stable with dialysis 9/25, may be able to discharge. Ok per nephrology to pull temp catheter.   Chronic combined CHF: EF here improved to EF 55-60%, mild LVH, no WMA's, mild stenosis on poorly visualized mitral valve, mod AR (see report).  - Volume per HD  Leukocytoclastic vasculitis with purpuric rash: Improving per patient without directed therapy.  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  Acute encephalopathy: Resolved.  DVT prophylaxis: heparin gtt Code Status: full  Family Communication: none at bedside Disposition Plan: pending improvement   Consultants:   Nephrology  PCCM  Procedures:  Echo Study Conclusions  - Left ventricle: The cavity size was normal. Wall thickness was   increased in Odette Watanabe pattern of mild LVH. Systolic function was normal.   The estimated ejection fraction was in the range of 55% to 60%.   Wall motion was normal; there were no regional wall motion   abnormalities. - Ventricular septum: The contour showed systolic flattening. These   changes are consistent with RV volume and pressure overload. - Aortic valve: There was moderate regurgitation. - Mitral valve: Lynette Topete bioprosthesis was present and poorly visualized.   The sewing ring had no rocking motion and showed no evidence of   dehiscence. The findings are consistent with mild stenosis. Valve   area by pressure half-time: 1.9 cm^2. Valve area by continuity   equation (using LVOT flow): 1.39 cm^2. - Left atrium: The atrium was massively dilated. - Right ventricle: The cavity size was dilated. Wall thickness was   normal. - Right atrium: The atrium was severely dilated. - Tricuspid valve: There was severe regurgitation. - Pulmonary arteries: Systolic pressure was moderately to severely   increased. PA peak pressure: 66 mm Hg (S).  ETT 9/20 -22 R IJ HD cath 9/20 - 9 /24  Antimicrobials: Anti-infectives (From admission,  onward)   Start     Dose/Rate Route Frequency Ordered Stop   08/10/18 1800  ceFEPIme (MAXIPIME) 1 g in sodium chloride 0.9 % 100 mL IVPB     1 g 200 mL/hr over 30 Minutes Intravenous Every 24 hours 08/09/18 1615 08/14/18 1759   08/10/18 0530   ceFEPIme (MAXIPIME) 1 g in sodium chloride 0.9 % 100 mL IVPB  Status:  Discontinued     1 g 200 mL/hr over 30 Minutes Intravenous Every 24 hours 08/09/18 1306 08/09/18 1615   08/10/18 0517  ceFEPIme (MAXIPIME) 1 g in sodium chloride 0.9 % 100 mL IVPB  Status:  Discontinued     1 g 200 mL/hr over 30 Minutes Intravenous Every 24 hours 08/09/18 1142 08/09/18 1306   08/09/18 1200  vancomycin (VANCOCIN) IVPB 750 mg/150 ml premix  Status:  Discontinued     750 mg 150 mL/hr over 60 Minutes Intravenous Every M-W-F (Hemodialysis) 08/06/18 1722 08/06/18 2035   08/08/18 1700  ceFEPIme (MAXIPIME) 2 g in sodium chloride 0.9 % 100 mL IVPB  Status:  Discontinued     2 g 200 mL/hr over 30 Minutes Intravenous Every 12 hours 08/08/18 1003 08/09/18 1142   08/07/18 1800  vancomycin (VANCOCIN) IVPB 750 mg/150 ml premix  Status:  Discontinued     750 mg 150 mL/hr over 60 Minutes Intravenous Every 24 hours 08/06/18 2038 08/09/18 1140   08/07/18 0500  ceFEPIme (MAXIPIME) 2 g in sodium chloride 0.9 % 100 mL IVPB  Status:  Discontinued     2 g 200 mL/hr over 30 Minutes Intravenous Every 12 hours 08/06/18 2037 08/08/18 1003   08/06/18 1800  ceFEPIme (MAXIPIME) 2 g in sodium chloride 0.9 % 100 mL IVPB  Status:  Discontinued     2 g 200 mL/hr over 30 Minutes Intravenous Every M-W-F (1800) 08/06/18 1722 08/06/18 2036   08/06/18 1615  vancomycin (VANCOCIN) 1,500 mg in sodium chloride 0.9 % 500 mL IVPB     1,500 mg 250 mL/hr over 120 Minutes Intravenous  Once 08/06/18 1551 08/06/18 2051   08/06/18 1545  ceFEPIme (MAXIPIME) 2 g in sodium chloride 0.9 % 100 mL IVPB     2 g 200 mL/hr over 30 Minutes Intravenous  Once 08/06/18 1540 08/06/18 1848   08/06/18 1545  metroNIDAZOLE (FLAGYL) IVPB 500 mg  Status:  Discontinued     500 mg 100 mL/hr over 60 Minutes Intravenous Every 8 hours 08/06/18 1540 08/07/18 1125   08/06/18 1545  vancomycin (VANCOCIN) IVPB 1000 mg/200 mL premix  Status:  Discontinued     1,000 mg 200 mL/hr  over 60 Minutes Intravenous  Once 08/06/18 1540 08/06/18 1551     Subjective: Denies complaints. Declines interpreter.    Objective: Vitals:   08/11/18 1543 08/11/18 1625 08/11/18 1811 08/11/18 1847  BP: (!) 100/51 (!) 82/60 (!) 89/60 (!) 70/50  Pulse: 82 71 78 86  Resp: 10 16 17    Temp: (!) 97.4 F (36.3 C) 98 F (36.7 C) 98.2 F (36.8 C) 98.2 F (36.8 C)  TempSrc: Oral Oral Oral Oral  SpO2: 100% 100% 100% 100%  Weight: 68.7 kg       Intake/Output Summary (Last 24 hours) at 08/11/2018 1925 Last data filed at 08/11/2018 1543 Gross per 24 hour  Intake 490 ml  Output 1943 ml  Net -1453 ml   Filed Weights   08/11/18 0500 08/11/18 1133 08/11/18 1543  Weight: 73.3 kg 71 kg 68.7 kg    Examination:  General exam: Appears calm and comfortable  Respiratory system: Clear to auscultation. Respiratory effort normal. Cardiovascular system: S1 & S2 heard, RRR. No JVD, murmurs, rubs, gallops or clicks. No pedal edema. Gastrointestinal system: Abdomen is nondistended, soft and nontender. No organomegaly or masses felt. Normal bowel sounds heard. Central nervous system: Alert and oriented. No focal neurological deficits. Extremities: bilateral erythematous LE rash Psychiatry: Judgement and insight appear normal. Mood & affect appropriate.     Data Reviewed: I have personally reviewed following labs and imaging studies  CBC: Recent Labs  Lab 08/06/18 1408  08/08/18 0444 08/09/18 0518 08/10/18 0117 08/10/18 2129 08/11/18 0308  WBC 6.5   < > 8.7 9.4 8.9 9.9 9.2  NEUTROABS 4.9  --  7.0 5.8 4.8  --  4.7  HGB 10.9*   < > 8.3* 7.5* 7.6* 7.1* 7.3*  HCT 34.0*   < > 25.7* 23.2* 23.7* 22.6* 22.4*  MCV 87.6   < > 88.3 88.5 89.1 90.0 88.9  PLT 118*   < > 148* 126* 131* 143* 142*   < > = values in this interval not displayed.   Basic Metabolic Panel: Recent Labs  Lab 08/07/18 1150 08/07/18 1630 08/08/18 0444  08/08/18 1607  08/09/18 1651 08/10/18 0117 08/10/18 1552  08/10/18 2129 08/11/18 0308  NA  --  138 134*   < >  --    < > 133* 135 133* 132* 131*  K  --  5.1 4.2   < >  --    < > 3.3* 3.6 3.6 4.0 4.0  CL  --  103 100   < >  --    < > 98 100 98 99 99  CO2  --  21* 20*   < >  --    < > 24 20* 22 19* 17*  GLUCOSE  --  140* 139*   < >  --    < > 144* 112* 101* 91 85  BUN  --  50* 45*   < >  --    < > 36* 41* 47* 51* 54*  CREATININE  --  4.38* 3.70*   < >  --    < > 3.58* 4.34* 5.28* 5.73* 6.08*  CALCIUM  --  8.5* 8.3*   < >  --    < > 8.5* 8.7* 8.7* 8.7* 8.6*  MG 2.3 2.6* 2.5*  --  2.5*  --   --  2.5*  --   --   --   PHOS 4.7* 4.8*  4.9* 4.9*   < > 4.4   < > 3.6 4.5 4.7* 4.7* 5.1*   < > = values in this interval not displayed.   GFR: Estimated Creatinine Clearance: 11.5 mL/min (Shelene Krage) (by C-G formula based on SCr of 6.08 mg/dL (H)). Liver Function Tests: Recent Labs  Lab 08/05/18 1144 08/06/18 1408  08/09/18 1651 08/10/18 0117 08/10/18 1552 08/10/18 2129 08/11/18 0308  AST 24 30  --   --   --   --   --   --   ALT 14 16  --   --   --   --   --   --   ALKPHOS 163* 120  --   --   --   --   --   --   BILITOT 1.0 1.8*  --   --   --   --   --   --   PROT 8.6* 7.6  --   --   --   --   --   --  ALBUMIN 4.2 3.4*   < > 3.0* 3.1* 2.9* 3.0* 3.0*   < > = values in this interval not displayed.   No results for input(s): LIPASE, AMYLASE in the last 168 hours. Recent Labs  Lab 08/06/18 1600  AMMONIA 43*   Coagulation Profile: Recent Labs  Lab 08/07/18 0349 08/08/18 1208 08/09/18 0518 08/10/18 0117 08/11/18 0308  INR 2.24 1.82 1.64 1.75 1.65   Cardiac Enzymes: Recent Labs  Lab 08/06/18 2233 08/07/18 0349 08/07/18 1009 08/07/18 1630  TROPONINI 0.06* 0.05* 0.04* 0.04*   BNP (last 3 results) No results for input(s): PROBNP in the last 8760 hours. HbA1C: No results for input(s): HGBA1C in the last 72 hours. CBG: Recent Labs  Lab 08/10/18 2031 08/11/18 0000 08/11/18 0358 08/11/18 0819 08/11/18 1623  GLUCAP 109* 93 80 73 63*    Lipid Profile: No results for input(s): CHOL, HDL, LDLCALC, TRIG, CHOLHDL, LDLDIRECT in the last 72 hours. Thyroid Function Tests: No results for input(s): TSH, T4TOTAL, FREET4, T3FREE, THYROIDAB in the last 72 hours. Anemia Panel: Recent Labs    08/10/18 0117  FERRITIN 823*  TIBC 267  IRON 212*   Sepsis Labs: Recent Labs  Lab 08/06/18 1431 08/06/18 1535 08/06/18 1852 08/06/18 2011  PROCALCITON  --   --  0.62  --   LATICACIDVEN 2.83* 2.45* 2.9* 2.3*    Recent Results (from the past 240 hour(s))  Culture, blood (Routine x 2)     Status: None   Collection Time: 08/06/18  2:08 PM  Result Value Ref Range Status   Specimen Description BLOOD BLOOD RIGHT WRIST  Final   Special Requests   Final    BOTTLES DRAWN AEROBIC AND ANAEROBIC Blood Culture adequate volume   Culture   Final    NO GROWTH 5 DAYS Performed at [facility], [place] [address] [place],  [place] [address]    Report Status 08/11/2018 FINAL  Final  Culture, blood (Routine x 2)     Status: None   Collection Time: 08/06/18  2:08 PM  Result Value Ref Range Status   Specimen Description BLOOD RIGHT ANTECUBITAL  Final   Special Requests   Final    BOTTLES DRAWN AEROBIC AND ANAEROBIC Blood Culture adequate volume   Culture   Final    NO GROWTH 5 DAYS Performed at [facility], [place] [address] [place], [place] [address]    Report Status 08/11/2018 FINAL  Final  MRSA PCR Screening     Status: None   Collection Time: 08/06/18  6:38 PM  Result Value Ref Range Status   MRSA by PCR NEGATIVE NEGATIVE Final    Comment:        The GeneXpert MRSA Assay (FDA approved for NASAL specimens only), is one component of Jeanett Antonopoulos comprehensive MRSA colonization surveillance program. It is not intended to diagnose MRSA infection nor to guide or monitor treatment for MRSA infections. Performed at [facility], [place] [address], [place], [place] [address]          Radiology Studies: No results  found.      Scheduled Meds: . amiodarone  200 mg Oral BID  . atorvastatin  20 mg Oral Daily  . Chlorhexidine Gluconate Cloth  6 each Topical Q0600  . darbepoetin (ARANESP) injection - DIALYSIS  100 mcg Intravenous Q Wed-HD  . famotidine  20 mg Per Tube Daily  . feeding supplement (NEPRO CARB STEADY)  237 mL Oral BID BM  . insulin aspart  0-15  Units Subcutaneous Q4H  . metoprolol tartrate  12.5 mg Oral BID  . midodrine  10 mg Per Tube TID WC  . Warfarin - Pharmacist Dosing Inpatient   Does not apply q1800   Continuous Infusions: . sodium chloride Stopped (08/09/18 2000)  . ceFEPime (MAXIPIME) IV 1 g (08/11/18 1703)  . heparin 1,150 Units/hr (08/11/18 0303)  . sodium chloride       LOS: 5 days    Time spent: over 30 min    Fayrene Helper, MD [facility] Pager [phone]  If 7PM-7AM, please contact night-coverage [email or web address] Password [facility] 08/11/2018, 7:25 PM

## 2018-08-12 ENCOUNTER — Ambulatory Visit: Payer: Medicare Other | Admitting: Family Medicine

## 2018-08-12 LAB — TRANSFUSION REACTION
DAT C3: NEGATIVE
Post RXN DAT IgG: POSITIVE

## 2018-08-12 LAB — CBC WITH DIFFERENTIAL/PLATELET
ABS IMMATURE GRANULOCYTES: 0 10*3/uL (ref 0.0–0.1)
BASOS PCT: 1 %
Basophils Absolute: 0.1 10*3/uL (ref 0.0–0.1)
Eosinophils Absolute: 0.3 10*3/uL (ref 0.0–0.7)
Eosinophils Relative: 4 %
HCT: 24.2 % — ABNORMAL LOW (ref 39.0–52.0)
Hemoglobin: 7.9 g/dL — ABNORMAL LOW (ref 13.0–17.0)
IMMATURE GRANULOCYTES: 0 %
Lymphocytes Relative: 25 %
Lymphs Abs: 1.6 10*3/uL (ref 0.7–4.0)
MCH: 29.4 pg (ref 26.0–34.0)
MCHC: 32.6 g/dL (ref 30.0–36.0)
MCV: 90 fL (ref 78.0–100.0)
MONOS PCT: 17 %
Monocytes Absolute: 1.1 10*3/uL — ABNORMAL HIGH (ref 0.1–1.0)
NEUTROS ABS: 3.5 10*3/uL (ref 1.7–7.7)
NEUTROS PCT: 53 %
PLATELETS: 111 10*3/uL — AB (ref 150–400)
RBC: 2.69 MIL/uL — ABNORMAL LOW (ref 4.22–5.81)
RDW: 19 % — ABNORMAL HIGH (ref 11.5–15.5)
WBC: 6.7 10*3/uL (ref 4.0–10.5)

## 2018-08-12 LAB — RENAL FUNCTION PANEL
ANION GAP: 12 (ref 5–15)
Albumin: 2.9 g/dL — ABNORMAL LOW (ref 3.5–5.0)
BUN: 22 mg/dL — ABNORMAL HIGH (ref 6–20)
CALCIUM: 8.5 mg/dL — AB (ref 8.9–10.3)
CO2: 26 mmol/L (ref 22–32)
Chloride: 98 mmol/L (ref 98–111)
Creatinine, Ser: 3.97 mg/dL — ABNORMAL HIGH (ref 0.61–1.24)
GFR calc non Af Amer: 15 mL/min — ABNORMAL LOW (ref >60)
GFR, EST AFRICAN AMERICAN: 18 mL/min — AB (ref >60)
Glucose, Bld: 103 mg/dL — ABNORMAL HIGH (ref 70–99)
Phosphorus: 3.3 mg/dL (ref 2.5–4.6)
Potassium: 3.7 mmol/L (ref 3.5–5.1)
SODIUM: 136 mmol/L (ref 135–145)

## 2018-08-12 LAB — GLUCOSE, CAPILLARY
Glucose-Capillary: 133 mg/dL — ABNORMAL HIGH (ref 70–99)
Glucose-Capillary: 74 mg/dL (ref 70–99)
Glucose-Capillary: 83 mg/dL (ref 70–99)
Glucose-Capillary: 93 mg/dL (ref 70–99)
Glucose-Capillary: 96 mg/dL (ref 70–99)

## 2018-08-12 LAB — MAGNESIUM: MAGNESIUM: 2.2 mg/dL (ref 1.7–2.4)

## 2018-08-12 LAB — BPAM RBC
BLOOD PRODUCT EXPIRATION DATE: 201910202359
ISSUE DATE / TIME: 201909251816
Unit Type and Rh: 7300

## 2018-08-12 LAB — TYPE AND SCREEN
ABO/RH(D): B POS
Antibody Screen: NEGATIVE
UNIT DIVISION: 0

## 2018-08-12 LAB — PROTIME-INR
INR: 1.71
Prothrombin Time: 19.9 seconds — ABNORMAL HIGH (ref 11.4–15.2)

## 2018-08-12 LAB — HEPARIN LEVEL (UNFRACTIONATED): Heparin Unfractionated: 0.35 IU/mL (ref 0.30–0.70)

## 2018-08-12 LAB — CORTISOL: CORTISOL PLASMA: 22.9 ug/dL

## 2018-08-12 MED ORDER — WARFARIN SODIUM 5 MG PO TABS
5.0000 mg | ORAL_TABLET | Freq: Once | ORAL | Status: AC
  Administered 2018-08-12: 5 mg via ORAL
  Filled 2018-08-12: qty 1

## 2018-08-12 MED ORDER — RENA-VITE PO TABS: 1.0000 | ORAL_TABLET | Freq: Every day | ORAL | Status: DC

## 2018-08-12 MED ORDER — MIDODRINE HCL 5 MG PO TABS: 10.0000 mg | ORAL_TABLET | Freq: Three times a day (TID) | ORAL | 0 refills | Status: AC

## 2018-08-12 MED ORDER — MIDODRINE HCL 5 MG PO TABS
10.0000 mg | ORAL_TABLET | Freq: Three times a day (TID) | ORAL | Status: DC
  Administered 2018-08-12: 10 mg via ORAL
  Filled 2018-08-12: qty 2

## 2018-08-12 MED ORDER — AMOXICILLIN-POT CLAVULANATE 500-125 MG PO TABS: 1.0000 | ORAL_TABLET | Freq: Every day | ORAL | 0 refills | Status: AC

## 2018-08-12 NOTE — Discharge Summary (Addendum)
Physician Discharge Summary  Michael [name] GUR:[id number] DOB: 05/31/1961 DOA: 08/06/2018  PCP: Charlott Rakes, MD  Admit date: 08/06/2018 Discharge date: 08/12/2018  Time spent: 40 minutes  Recommendations for Outpatient Follow-up:  1. Follow up outpatient CBC/CMP 2. Follow up blood pressure, pt with persistently low BP's here.  Apparently per discussion with nephrology today, he gets higher dose of midodrine prior to dialysis (per discussion, they noted increased dose of midodrine of 20? Prior to dialysis) 3. Continue abx, follow up repeat imaging for pneumonia as outpatient 4.  Follow up INR, discussed with renal who will check INR on Saturday with dialysis, then pt has appointment with coumadin clinic on Tuesday 5. 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 He was intubated for desaturation and airway protection with CXR demonstrating RLL infiltrate. He was treated for septic shock and CRRT initiated.  Hospital Course:  Septic shock due to RLL pneumonia:  - Will transition to augmentin on discharge for an additional 3 days - remains afebrile with no leukocytosis.  - Blood cultures from admission NGTD x5 - Follow repeat CXR as outpatient  Hypotension:  Pt has chronic hypotension.  Discussed with nephrology who note that per their review, he gets 20? Of midodrine prior to dialysis.  Midodrine increased to  10 TID while here, will d/c with this as well.  He had worsening hypotension last night, initially concern for transfusion reaction as this occurred 15 min after transfusion started, but transfusion reaction labs without evidence of hemolytic transfusion reaction.  - BP's improved today, pt continues to be asx - random cortisol >20, unlikely adrenal insufficiency - Continue midodrine  Anemia of chronic disease:  Hb 7.9 today, stable from yesterday, only received partial transfusion as pt developed hypotension with blood as noted above. - Follow CBC as outpatient  Acute hypoxic respiratory failure: Resolved, extubated 9/22.   ESRD: s/p CRRT during shock in ICU. Now plan to return to intermittent HD MWFS.  Chronic combined CHF: EF here improved to EF 55-60%, mild LVH, no WMA's, mild stenosis on poorly visualized mitral valve, mod AR (see report).  - Volume per HD  Leukocytoclastic vasculitis with purpuric rash: Improving per patient without directed therapy.  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  Acute encephalopathy: Resolved.  Procedures: Echo Study Conclusions  - Left ventricle: The cavity size was normal. Wall thickness was increased in Michael [name] pattern of mild LVH. Systolic function was normal. The estimated ejection fraction was in the range of 55% to 60%. Wall motion was normal; there were no regional wall motion abnormalities. - Ventricular septum: The contour showed systolic flattening. These changes are consistent with RV volume and pressure overload. - Aortic valve: There was moderate regurgitation. - Mitral  valve: Michael [name] bioprosthesis was present and poorly visualized. The sewing ring had no rocking  motion and showed no evidence of dehiscence. The findings are consistent with mild stenosis. Valve area by pressure half-time: 1.9 cm^2. Valve area by continuity equation (using LVOT flow): 1.39 cm^2. - Left atrium: The atrium was massively dilated. - Right ventricle: The cavity size was dilated. Wall thickness was normal. - Right atrium: The atrium was severely dilated. - Tricuspid valve: There was severe regurgitation. - Pulmonary arteries: Systolic pressure was moderately to severely increased. PA peak pressure: 66 mm Hg (S).  ETT 9/20 -22 R IJ HD cath 9/20 - 9 /24  Consultations:  PCCM  Nephrology  Discharge Exam: Vitals:   08/12/18 1155 08/12/18 1622  BP: 97/62 (!) 84/56  Pulse: (!) 110 98  Resp: 18 18  Temp: 98.2 F (36.8 C) 98.3 F (36.8 C)  SpO2: 100% 98%   Asking to go home. No complaints today. Feeling well.  General: No acute distress. Cardiovascular: Heart sounds show Michael [name] regular rate, and rhythm. Lungs: Clear to auscultation bilaterally  Abdomen: Soft, nontender, nondistended  Neurological: Alert and oriented 3. Moves all extremities 4. Cranial nerves II through XII grossly intact. Skin: Warm and dry. No rashes or lesions. Extremities: No clubbing or cyanosis. No edema.  Psychiatric: Mood and affect are normal. Insight and judgment are appropriate.  Discharge Instructions   Discharge Instructions    Diet - low sodium heart healthy   Complete by:  As directed    Discharge instructions   Complete by:  As directed    You were seen for pneumonia.  You've improved with antibiotics.  I'll send you home with another 3 days of augmentin.  Take this after your dialysis.  Your blood pressures have been low, we increased your midodrine to 10 mg three times daily.    Please follow up with your PCP as an outpatient.  You'll need follow up imaging as an outpatient.  Please follow up with your scheduled dialysis.  On my discussion with your  nephrologist, it sounds like they give an increased dose of midodrine on your dialysis days.   Please follow up with coumadin clinic.  I'll have someone check your INR this weekend (we're going to plan to do this at dialysis on Saturday).  Return for new, recurrent, or worsening symptoms.  Please ask your PCP to request records from this hospitalization so they know what was done and what the next steps will be.   Increase activity slowly   Complete by:  As directed      Allergies as of 08/12/2018      Reactions   Triamcinolone Other (See Comments)   Felt like skin was burning      Medication List    STOP taking these medications   hydrOXYzine 25 MG tablet Commonly known as:  ATARAX/VISTARIL   omeprazole 20 MG capsule Commonly known as:  PRILOSEC   traMADol 50 MG tablet Commonly known as:  ULTRAM     TAKE these medications   acetaminophen 500 MG tablet Commonly known as:  TYLENOL Take 1,000 mg by mouth daily as needed for headache (pain).   amiodarone 200 MG tablet Commonly known as:  PACERONE TAKE 1 TABLET BY MOUTH TWICE Devarion Mcclanahan DAY   amoxicillin-clavulanate 500-125 MG tablet Commonly known as:  AUGMENTIN Take 1 tablet (500 mg total) by mouth daily for 3 days. Take after dialysis.  Start on 9/27.   atorvastatin 20 MG tablet Commonly known as:  LIPITOR TAKE 1 TABLET (20 MG TOTAL) BY MOUTH DAILY. PLEASE KEEP UPCOMING APPT. THANK YOU   calcium acetate 667 MG capsule Commonly known as:  PHOSLO Take 1 capsule (667 mg total) by mouth 3 (three) times daily with meals. Please keep upcoming appt. Thank You   colchicine 0.6 MG tablet Take 0.5 tablets (0.3 mg total) by mouth 2 (two) times Margean Korell week. What changed:    when to take this  reasons to take this Notes to patient:  Continue home schedule   diazepam 5 MG tablet Commonly known as:  VALIUM Take 5 mg by mouth at bedtime as needed (sleep).   diphenhydramine-acetaminophen 25-500 MG Tabs tablet Commonly known as:   TYLENOL PM Take 1 tablet by mouth at bedtime as needed (pain).   fluticasone 50 MCG/ACT nasal spray Commonly known as:  FLONASE Place 2 sprays into both nostrils daily. What changed:    when to take this  reasons to take this   folic acid 1 MG tablet Commonly known as:  FOLVITE TAKE 1 TABLET BY MOUTH EVERY DAY   gabapentin 100 MG capsule Commonly known as:  NEURONTIN Take 200 mg by mouth at bedtime. FOR LEG PAIN   metoprolol tartrate 25 MG tablet Commonly known as:  LOPRESSOR Take 1 tablet (25 mg total) by mouth 2 (two) times daily.   midodrine 5 MG tablet Commonly known as:  PROAMATINE Take 2 tablets (10 mg total) by mouth 3 (three) times daily with meals. What changed:  how much to take   multivitamin Tabs tablet Take 1 tablet by mouth at bedtime.   OXYGEN Inhale 2 L into the lungs daily as needed (shortness of breath). Notes to patient:  Continue home schedule   tiZANidine 4 MG tablet Commonly known as:  ZANAFLEX Take 1 tablet (4 mg total) by mouth every 8 (eight) hours as needed for muscle spasms.   warfarin 2.5 MG tablet Commonly known as:  COUMADIN Take as directed. If you are unsure how to take this medication, talk to your nurse or doctor. Original instructions:  Take 1.5 tablets daily or as directed by Coumadin clinic What changed:    how much to take  how to take this  when to take this  additional instructions      Allergies  Allergen Reactions  . Triamcinolone Other (See Comments)    Felt like skin was burning   Follow-up Information    [facility] Follow up on 08/17/2018.   Specialty:  Cardiology Why:  Please arrive 15 minutes early for your 8:45am coumadin clinic appointment Contact information: [address], [place]       Charlott Rakes, MD Follow up.   Specialty:  Family Medicine Why:  Call for follow up appointment Contact information: [place] [address] [phone]        Fay Records, MD .   Specialty:  Cardiology Contact information: [place] [address] [phone]        [facility] Follow up.   Specialty:  Home Health Services Why:  They will contact you for visit on Saturday.  Contact information: [place] [address] [phone]            The results of significant diagnostics from this hospitalization (including imaging, microbiology, ancillary and laboratory) are listed below for reference.    Significant Diagnostic Studies: Dg  Chest 2 View  Result Date: 08/06/2018 CLINICAL DATA:  Hypotension. EXAM: CHEST - 2 VIEW COMPARISON:  Radiograph of December 13, 2017. FINDINGS: Stable cardiomegaly. No pneumothorax is noted. Left lung is clear. Moderate right pleural effusion is noted with probable underlying atelectasis or infiltrate. Sternotomy wires are noted. Bony thorax is unremarkable. IMPRESSION: Moderate right pleural effusion with probable underlying atelectasis or infiltrate. Electronically Signed   By: Marijo Conception, M.D.   On: 08/06/2018 14:44   Dg Chest Port 1 View  Result Date: 08/07/2018 CLINICAL DATA:  Respiratory failure. EXAM: PORTABLE CHEST 1 VIEW COMPARISON:  Chest x-rays dated 08/06/2018 and 12/13/2017 FINDINGS: Endotracheal tube tip is 5 cm above the carina. Central venous catheter tip is in the superior vena cava region just above the azygos vein. NG tube tip is below the diaphragm. There is cardiomegaly with tortuosity and calcification of the thoracic aorta. Left lung is clear. Persistent haziness at the right lung base felt represent Britlyn Martine combination of pleural effusion and scarring at that base as demonstrated on multiple prior exams. No significant change since the prior study. IMPRESSION: No significant change since the prior study. Persistent right effusion superimposed on scarring at the right base.  Electronically Signed   By: [name] M.D.   On: 08/07/2018 07:45   Dg Chest Port 1 View  Result Date: 08/06/2018 CLINICAL DATA:  Status post central line placement. EXAM: PORTABLE CHEST 1 VIEW COMPARISON:  08/06/2018 at 1616 hours FINDINGS: New right internal jugular central venous line has its tip projecting in the mid superior vena cava. No pneumothorax. Endotracheal tube and nasal/orogastric tube are stable. Right pleural effusion with associated basilar atelectasis or infiltrate is stable. Stable changes from prior cardiac surgery. IMPRESSION: 1. New right internal jugular dual-lumen central venous catheter. Tip projects in the mid superior vena cava. 2. No pneumothorax.  No other change from the earlier study. Electronically Signed   By: Lajean Manes M.D.   On: 08/06/2018 21:59   Dg Chest Portable 1 View  Result Date: 08/06/2018 CLINICAL DATA:  Encounter for endotracheal tube placement. EXAM: PORTABLE CHEST 1 VIEW COMPARISON:  Two-view chest x-ray 08/06/2018 at 2:35 p.m. FINDINGS: The patient is now intubated. Endotracheal tube terminates 3.5 cm above the carina. An NG tube has been placed. It courses off the inferior border of the film. The heart is enlarged. Atherosclerotic calcifications are present at the aortic arch. Right lower lobe airspace disease and effusion are again noted. The left lung is clear. IMPRESSION: 1. Satisfactory positioning endotracheal tube. 2. NG tube courses off the inferior border of the film. 3. Persistent right lower lobe airspace disease concerning for pneumonia. 4. Right pleural effusion. Electronically Signed   By: San Morelle M.D.   On: 08/06/2018 16:41    Microbiology: Recent Results (from the past 240 hour(s))  Culture, blood (Routine x 2)     Status: None   Collection Time: 08/06/18  2:08 PM  Result Value Ref Range Status   Specimen Description BLOOD BLOOD RIGHT WRIST  Final   Special Requests   Final    BOTTLES DRAWN AEROBIC AND ANAEROBIC  Blood Culture adequate volume   Culture   Final    NO GROWTH 5 DAYS Performed at [facility], [address] [place], [place] [address]    Report Status 08/11/2018 FINAL  Final  Culture, blood (Routine x 2)     Status: None   Collection Time: 08/06/18  2:08 PM  Result Value Ref Range  Status   Specimen Description BLOOD RIGHT ANTECUBITAL  Final   Special Requests   Final    BOTTLES DRAWN AEROBIC AND ANAEROBIC Blood Culture adequate volume   Culture   Final    NO GROWTH 5 DAYS Performed at [facility], [address] [place], [place] [address]    Report Status 08/11/2018 FINAL  Final  MRSA PCR Screening     Status: None   Collection Time: 08/06/18  6:38 PM  Result Value Ref Range Status   MRSA by PCR NEGATIVE NEGATIVE Final    Comment:        The GeneXpert MRSA Assay (FDA approved for NASAL specimens only), is one component of Olu[place]isin Deman comprehensive MRSA colonization surveillance program. It is not intended to diagnose MRSA infection nor to guide or monitor treatment for MRSA infections. Performed at [facility], [place] [address], [place], [place] [address]      Labs: Basic Metabolic Panel: Recent Labs  Lab 08/07/18 1630 08/08/18 0444  08/08/18 1607  08/10/18 0117 08/10/18 1552 08/10/18 2129 08/11/18 0308 08/12/18 0512  NA 138 134*   < >  --    < > 135 133* 132* 131* 136  K 5.1 4.2   < >  --    < > 3.6 3.6 4.0 4.0 3.7  CL 103 100   < >  --    < > 100 98 99 99 98  CO2 21* 20*   < >  --    < > 20* 22 19* 17* 26  GLUCOSE 140* 139*   < >  --    < > 112* 101* 91 85 103*  BUN 50* 45*   < >  --    < > 41* 47* 51* 54* 22*  CREATININE 4.38* 3.70*   < >  --    < > 4.34* 5.28* 5.73* 6.08* 3.97*  CALCIUM 8.5* 8.3*   < >  --    < > 8.7* 8.7* 8.7* 8.6* 8.5*  MG 2.6* 2.5*  --  2.5*  --  2.5*  --   --   --  2.2  PHOS 4.8*  4.9* 4.9*   < > 4.4   < > 4.5 4.7* 4.7* 5.1* 3.3   < > = values in this interval not displayed.   Liver Function Tests: Recent Labs  Lab  08/06/18 1408  08/10/18 0117 08/10/18 1552 08/10/18 2129 08/11/18 0308 08/12/18 0512  AST 30  --   --   --   --   --   --   ALT 16  --   --   --   --   --   --   ALKPHOS 120  --   --   --   --   --   --   BILITOT 1.8*  --   --   --   --   --   --   PROT 7.6  --   --   --   --   --   --   ALBUMIN 3.4*   < > 3.1* 2.9* 3.0* 3.0* 2.9*   < > = values in this interval not displayed.   No results for input(s): LIPASE, AMYLASE in the last 168 hours. Recent Labs  Lab 08/06/18 1600  AMMONIA 43*   CBC: Recent Labs  Lab 08/08/18 0444 08/09/18 0518 08/10/18 0117 08/10/18 2129 08/11/18 0308 08/12/18 0512  WBC 8.7 9.4 8.9 9.9 9.2  6.7  NEUTROABS 7.0 5.8 4.8  --  4.7 3.5  HGB 8.3* 7.5* 7.6* 7.1* 7.3* 7.9*  HCT 25.7* 23.2* 23.7* 22.6* 22.4* 24.2*  MCV 88.3 88.5 89.1 90.0 88.9 90.0  PLT 148* 126* 131* 143* 142* 111*   Cardiac Enzymes: Recent Labs  Lab 08/06/18 2233 08/07/18 0349 08/07/18 1009 08/07/18 1630  TROPONINI 0.06* 0.05* 0.04* 0.04*   BNP: BNP (last 3 results) Recent Labs    11/23/17 2222  BNP 977.3*    ProBNP (last 3 results) No results for input(s): PROBNP in the last 8760 hours.  CBG: Recent Labs  Lab 08/11/18 2344 08/12/18 0420 08/12/18 0808 08/12/18 1151 08/12/18 1628  GLUCAP 74 83 96 133* 93       Signed:  Fayrene Helper MD.  [facility] 08/12/2018, 7:06 PM

## 2018-08-12 NOTE — Progress Notes (Signed)
[facility] Progress Note   Subjective:  Seen in room.  Eating breakfast. No complaints today. Denies CP/SOB Asking when he's going home   Objective Vitals:   08/11/18 2059 08/12/18 0042 08/12/18 0443 08/12/18 0813  BP: 104/89 (!) 91/54 (!) 86/53 (!) 88/64  Pulse: 82 75 85 89  Resp: 18 18 18 18   Temp: 97.8 F (36.6 C) 98 F (36.7 C) 98.4 F (36.9 C) 97.9 F (36.6 C)  TempSrc: Oral Oral Oral Oral  SpO2: 100% 100% 94% 98%  Weight:       Physical Exam General: WNWD male NAD  Heart: RRR Lungs: CTAB  Abdomen: soft NT/ND Extremities: Trace LE edema  Dialysis Access: LUE AVF +bruit   Dialysis Orders:  [facility]h 300/800 65kg 3K/2.25 bath LUA AVF Hep none - mircera 75 ug every 4 wks  Assessment/Plan: 1. Septic shock from right lower lobe pneumonia: Now extubated. On cefepime. 2. ESRD - CRRT from 9/20- 9/23. IHD resumed 9/25. Continue on MWFS schedule. Next HD 9/27 3. 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 Continue ca acetate binder  5. Hypotension/volume  - chronic on midodrine. Not to EDW yet. Post HD wt 68.7kg Net UF 1.9L  6. Afib - On amiodarone. Warfarin per pharmacy INR 1.71 7. Nutritrion - Alb low. Nepro supp.  Lynnda Child PA-C [facility] Pager [phone] 08/12/2018,9:43 AM  LOS: 6 days   Additional Objective Labs: Basic Metabolic Panel: Recent Labs  Lab 08/10/18 2129 08/11/18 0308 08/12/18 0512  NA 132* 131* 136  K 4.0 4.0 3.7  CL 99 99 98  CO2 19* 17* 26  GLUCOSE 91 85 103*  BUN 51* 54* 22*  CREATININE 5.73* 6.08* 3.97*  CALCIUM 8.7* 8.6* 8.5*  PHOS 4.7* 5.1* 3.3   CBC: Recent Labs  Lab 08/09/18 0518 08/10/18 0117 08/10/18 2129 08/11/18 0308 08/12/18 0512  WBC 9.4 8.9 9.9 9.2 6.7  NEUTROABS 5.8 4.8  --  4.7 3.5  HGB 7.5* 7.6* 7.1* 7.3* 7.9*  HCT 23.2* 23.7* 22.6* 22.4* 24.2*  MCV 88.5 89.1 90.0 88.9 90.0  PLT 126* 131* 143*  142* 111*   Blood Culture    Component Value Date/Time   SDES BLOOD BLOOD RIGHT WRIST 08/06/2018 1408   SDES BLOOD RIGHT ANTECUBITAL 08/06/2018 1408   SPECREQUEST  08/06/2018 1408    BOTTLES DRAWN AEROBIC AND ANAEROBIC Blood Culture adequate volume   SPECREQUEST  08/06/2018 1408    BOTTLES DRAWN AEROBIC AND ANAEROBIC Blood Culture adequate volume   CULT  08/06/2018 1408    NO GROWTH 5 DAYS Performed at [facility], [place] [address], [place], [place] [address]    CULT  08/06/2018 1408    NO GROWTH 5 DAYS Performed at [facility], [place] [address] [place], [place] [address]    REPTSTATUS 08/11/2018 FINAL 08/06/2018 1408   REPTSTATUS 08/11/2018 FINAL 08/06/2018 1408    Cardiac Enzymes: Recent Labs  Lab 08/06/18 2233 08/07/18 0349 08/07/18 1009 08/07/18 1630  TROPONINI 0.06* 0.05* 0.04* 0.04*   CBG: Recent Labs  Lab 08/11/18 0819 08/11/18 1623 08/11/18 2344 08/12/18 0420 08/12/18 0808  GLUCAP 73 63* 74 83 96   Iron Studies:  Recent Labs    08/10/18 0117  IRON 212*  TIBC 267  FERRITIN 823*   Lab Results  Component Value Date   INR 1.71 08/12/2018   INR 1.65  08/11/2018   INR 1.75 08/10/2018   Medications: . sodium chloride Stopped (08/09/18 2000)  . ceFEPime (MAXIPIME) IV 1 g (08/11/18 1703)  . heparin 1,150 Units/hr (08/12/18 0153)   . amiodarone  200 mg Oral BID  . atorvastatin  20 mg Oral Daily  . Chlorhexidine Gluconate Cloth  6 each Topical Q0600  . darbepoetin (ARANESP) injection - DIALYSIS  100 mcg Intravenous Q Wed-HD  . famotidine  20 mg Per Tube Daily  . feeding supplement (NEPRO CARB STEADY)  237 mL Oral BID BM  . insulin aspart  0-15 Units Subcutaneous Q4H  . midodrine  10 mg Per Tube TID WC  . Warfarin - Pharmacist Dosing Inpatient   Does not apply [phone]

## 2018-08-12 NOTE — Progress Notes (Signed)
[place] for heparin Indication: atrial fibrillation  Allergies  Allergen Reactions  . Triamcinolone Other (See Comments)    Felt like skin was burning    Patient Measurements: Weight: 151 lb 7.3 oz (68.7 kg)(stood to scale ) Heparin Dosing Weight: 73.4kg  Vital Signs: Temp: 97.9 F (36.6 C) (09/26 0813) Temp Source: Oral (09/26 0813) BP: 88/64 (09/26 0813) Pulse Rate: 89 (09/26 0813)  Labs: Recent Labs    08/10/18 0117  08/10/18 2129 08/11/18 0308 08/12/18 0512  HGB 7.6*  --  7.1* 7.3* 7.9*  HCT 23.7*  --  22.6* 22.4* 24.2*  PLT 131*  --  143* 142* 111*  LABPROT 20.2*  --   --  19.4* 19.9*  INR 1.75  --   --  1.65 1.71  HEPARINUNFRC 0.25*   < > 0.46 0.45 0.35  CREATININE 4.34*   < > 5.73* 6.08* 3.97*   < > = values in this interval not displayed.    Estimated Creatinine Clearance: 17.6 mL/min (A) (by C-G formula based on SCr of 3.97 mg/dL (H)).   Medical History: Past Medical History:  Diagnosis Date  . Anemia of chronic disease   . Asthma    said he does not know  . Chronic kidney disease (CKD), stage IV (severe) ([place])   . Chronic systolic CHF (congestive heart failure) (HCC)    a. prior EF normal; b. Echo 10/16: Mild LVH, EF 30-35%, mitral valve bioprosthesis present without evidence of stenosis or regurgitation, severe LAE, mild RVE with mild to moderately reduced RVSF, moderate RAE  . Cirrhosis ([place])   . Coronary artery disease   . Diabetes mellitus type 2 in obese ([place])   . Diabetes mellitus without complication ([place])   . Dyspnea   . GERD (gastroesophageal reflux disease)   . H/O tricuspid valve repair 2012  . History of echocardiogram    Echo [facility] 4/12: mod LVH, EF 60-65%, mod LAE, tissue MVR ok, mod TR, mod pulmo HTN with RVSP 48 mmHg  . Hypertension   . Obstructive sleep apnea   . Paroxysmal atrial fibrillation (HCC)    s/p Maze procedure at time of MVR in 2011  . Pulmonary HTN ([place])   .  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 Thrombocytopenia ([place])     Assessment: 5 yoM with ESRD on MWF HD presenting with hypotension and altered mental status, admitted for pneumonia and sepsis. Pharmacy has been consulted for heparin dosing. Patient on PTA warfarin for atrial fibrillation (PTA dose unclear, last reported as 3.75mg  daily) and supratherapeutic INR 4.24 at admission with bleeding from IJ HD cath site requiring purse string suture and anticoagulation reversal with vitamin K. Warfarin has been held since admission on 9/20  Heparin level decreased but remains therapeutic this morning at 0.35 on 1150 units/hr. Heparin levels previously therapeutic x2 on same rate. No issues with line per RN.    Warfarin therapy restarted 9/24. INR trending up but remains subtherapeutic today at 1.71.  Hgb is low but slightly trending upward, plts decreased but ok, no bleeding reported.  Goal of Therapy:  INR goal 2-3 Heparin level 0.3-0.7 units/ml Monitor platelets by anticoagulation protocol: Yes   Plan:  Warfarin 5mg  PO x1 again tonight Continue heparin infusion at 1150 units/hr Monitor daily INR, CBC, heparin level, and s/sx of bleeding   Juanell Fairly, PharmD PGY1 Pharmacy Resident Phone [phone] 08/12/2018 10:25 AM

## 2018-08-12 NOTE — Care Management Note (Signed)
Case Management Note  Patient Details  Name: Melchor Kirchgessner MRN: [id number] Date of Birth: [birth year]  Subjective/Objective:                    Action/Plan: Pt to have coumadin level checked at HD on Saturday. Orders placed for [facility] RN for this and BP monitoring but MD feels that is not needed now.  Pt states he has transportation home.   Expected Discharge Date:  08/12/18               Expected Discharge Plan:  [place]  In-House Referral:     Discharge planning Services  CM Consult  Post Acute Care Choice:  Home Health Choice offered to:  Patient  DME Arranged:    DME Agency:     HH Arranged:  RN [place] Agency:  [place]  Status of Service:  Completed, signed off  If discussed at [place] of Stay Meetings, dates discussed:    Additional Comments:  Pollie Friar, RN 08/12/2018, 3:19 PM

## 2018-08-14 DIAGNOSIS — N186 End stage renal disease: Secondary | ICD-10-CM | POA: Diagnosis not present

## 2018-08-14 DIAGNOSIS — D631 Anemia in chronic kidney disease: Secondary | ICD-10-CM | POA: Diagnosis not present

## 2018-08-14 DIAGNOSIS — N2581 Secondary hyperparathyroidism of renal origin: Secondary | ICD-10-CM | POA: Diagnosis not present

## 2018-08-14 DIAGNOSIS — Z992 Dependence on renal dialysis: Secondary | ICD-10-CM | POA: Diagnosis not present

## 2018-08-14 DIAGNOSIS — E876 Hypokalemia: Secondary | ICD-10-CM | POA: Diagnosis not present

## 2018-08-14 DIAGNOSIS — E119 Type 2 diabetes mellitus without complications: Secondary | ICD-10-CM | POA: Diagnosis not present

## 2018-08-14 DIAGNOSIS — D689 Coagulation defect, unspecified: Secondary | ICD-10-CM | POA: Diagnosis not present

## 2018-08-15 LAB — BENZODIAZEPINES,MS,WB/SP RFX
7-AMINOCLONAZEPAM: NEGATIVE ng/mL
ALPRAZOLAM: NEGATIVE ng/mL
Benzodiazepines Confirm: POSITIVE
CLONAZEPAM: NEGATIVE ng/mL
Chlordiazepoxide: NEGATIVE ng/mL
DESALKYLFLURAZEPAM: NEGATIVE ng/mL
Desmethylchlordiazepoxide: NEGATIVE ng/mL
Desmethyldiazepam: 48 ng/mL
Diazepam: 21 ng/mL
FLURAZEPAM: NEGATIVE ng/mL
Lorazepam: NEGATIVE ng/mL
Midazolam: 29 ng/mL
Oxazepam: NEGATIVE ng/mL
TRIAZOLAM: NEGATIVE ng/mL
Temazepam: NEGATIVE ng/mL

## 2018-08-15 LAB — DRUG SCREEN 10 W/CONF, SERUM
Amphetamines, IA: NEGATIVE ng/mL
Barbiturates, IA: NEGATIVE ug/mL
Benzodiazepines, IA: POSITIVE ng/mL
Cocaine & Metabolite, IA: NEGATIVE ng/mL
Methadone, IA: NEGATIVE ng/mL
Opiates, IA: NEGATIVE ng/mL
Oxycodones, IA: NEGATIVE ng/mL
Phencyclidine, IA: NEGATIVE ng/mL
Propoxyphene, IA: NEGATIVE ng/mL
THC(MARIJUANA) METABOLITE, IA: NEGATIVE ng/mL

## 2018-08-16 DIAGNOSIS — N2581 Secondary hyperparathyroidism of renal origin: Secondary | ICD-10-CM | POA: Diagnosis not present

## 2018-08-16 DIAGNOSIS — E119 Type 2 diabetes mellitus without complications: Secondary | ICD-10-CM | POA: Diagnosis not present

## 2018-08-16 DIAGNOSIS — Z992 Dependence on renal dialysis: Secondary | ICD-10-CM | POA: Diagnosis not present

## 2018-08-16 DIAGNOSIS — E876 Hypokalemia: Secondary | ICD-10-CM | POA: Diagnosis not present

## 2018-08-16 DIAGNOSIS — I482 Chronic atrial fibrillation: Secondary | ICD-10-CM | POA: Diagnosis not present

## 2018-08-16 DIAGNOSIS — D631 Anemia in chronic kidney disease: Secondary | ICD-10-CM | POA: Diagnosis not present

## 2018-08-16 DIAGNOSIS — N186 End stage renal disease: Secondary | ICD-10-CM | POA: Diagnosis not present

## 2018-08-17 ENCOUNTER — Ambulatory Visit (INDEPENDENT_AMBULATORY_CARE_PROVIDER_SITE_OTHER): Payer: Medicare Other | Admitting: Pharmacist

## 2018-08-17 DIAGNOSIS — I509 Heart failure, unspecified: Secondary | ICD-10-CM | POA: Diagnosis not present

## 2018-08-17 DIAGNOSIS — I059 Rheumatic mitral valve disease, unspecified: Secondary | ICD-10-CM | POA: Diagnosis not present

## 2018-08-17 DIAGNOSIS — I079 Rheumatic tricuspid valve disease, unspecified: Secondary | ICD-10-CM

## 2018-08-17 DIAGNOSIS — Z5181 Encounter for therapeutic drug level monitoring: Secondary | ICD-10-CM | POA: Diagnosis not present

## 2018-08-17 DIAGNOSIS — Z992 Dependence on renal dialysis: Secondary | ICD-10-CM | POA: Diagnosis not present

## 2018-08-17 DIAGNOSIS — N186 End stage renal disease: Secondary | ICD-10-CM | POA: Diagnosis not present

## 2018-08-17 LAB — POCT INR: INR: 5.9 — AB (ref 2.0–3.0)

## 2018-08-17 NOTE — Patient Instructions (Signed)
Description   Skip your Coumadin for 3 days, then continue on same dosage 1.5 tablets every day.  Recheck INR in 1week.  Coumadin Clinic [phone] Pt's new number is [phone]

## 2018-08-18 DIAGNOSIS — D631 Anemia in chronic kidney disease: Secondary | ICD-10-CM | POA: Diagnosis not present

## 2018-08-18 DIAGNOSIS — I50810 Right heart failure, unspecified: Secondary | ICD-10-CM | POA: Diagnosis not present

## 2018-08-18 DIAGNOSIS — N186 End stage renal disease: Secondary | ICD-10-CM | POA: Diagnosis not present

## 2018-08-18 DIAGNOSIS — D509 Iron deficiency anemia, unspecified: Secondary | ICD-10-CM | POA: Diagnosis not present

## 2018-08-18 DIAGNOSIS — Z23 Encounter for immunization: Secondary | ICD-10-CM | POA: Diagnosis not present

## 2018-08-18 DIAGNOSIS — N2581 Secondary hyperparathyroidism of renal origin: Secondary | ICD-10-CM | POA: Diagnosis not present

## 2018-08-18 DIAGNOSIS — E119 Type 2 diabetes mellitus without complications: Secondary | ICD-10-CM | POA: Diagnosis not present

## 2018-08-18 DIAGNOSIS — I5022 Chronic systolic (congestive) heart failure: Secondary | ICD-10-CM | POA: Diagnosis not present

## 2018-08-18 DIAGNOSIS — I132 Hypertensive heart and chronic kidney disease with heart failure and with stage 5 chronic kidney disease, or end stage renal disease: Secondary | ICD-10-CM | POA: Diagnosis not present

## 2018-08-18 DIAGNOSIS — Z992 Dependence on renal dialysis: Secondary | ICD-10-CM | POA: Diagnosis not present

## 2018-08-18 DIAGNOSIS — E876 Hypokalemia: Secondary | ICD-10-CM | POA: Diagnosis not present

## 2018-08-20 ENCOUNTER — Ambulatory Visit: Payer: Medicare Other | Admitting: Physician Assistant

## 2018-08-20 DIAGNOSIS — D509 Iron deficiency anemia, unspecified: Secondary | ICD-10-CM | POA: Diagnosis not present

## 2018-08-20 DIAGNOSIS — E876 Hypokalemia: Secondary | ICD-10-CM | POA: Diagnosis not present

## 2018-08-20 DIAGNOSIS — D631 Anemia in chronic kidney disease: Secondary | ICD-10-CM | POA: Diagnosis not present

## 2018-08-20 DIAGNOSIS — Z992 Dependence on renal dialysis: Secondary | ICD-10-CM | POA: Diagnosis not present

## 2018-08-20 DIAGNOSIS — N186 End stage renal disease: Secondary | ICD-10-CM | POA: Diagnosis not present

## 2018-08-20 DIAGNOSIS — N2581 Secondary hyperparathyroidism of renal origin: Secondary | ICD-10-CM | POA: Diagnosis not present

## 2018-08-21 DIAGNOSIS — Z992 Dependence on renal dialysis: Secondary | ICD-10-CM | POA: Diagnosis not present

## 2018-08-21 DIAGNOSIS — E876 Hypokalemia: Secondary | ICD-10-CM | POA: Diagnosis not present

## 2018-08-21 DIAGNOSIS — N2581 Secondary hyperparathyroidism of renal origin: Secondary | ICD-10-CM | POA: Diagnosis not present

## 2018-08-21 DIAGNOSIS — N186 End stage renal disease: Secondary | ICD-10-CM | POA: Diagnosis not present

## 2018-08-21 DIAGNOSIS — D631 Anemia in chronic kidney disease: Secondary | ICD-10-CM | POA: Diagnosis not present

## 2018-08-21 DIAGNOSIS — D509 Iron deficiency anemia, unspecified: Secondary | ICD-10-CM | POA: Diagnosis not present

## 2018-08-23 DIAGNOSIS — D509 Iron deficiency anemia, unspecified: Secondary | ICD-10-CM | POA: Diagnosis not present

## 2018-08-23 DIAGNOSIS — E876 Hypokalemia: Secondary | ICD-10-CM | POA: Diagnosis not present

## 2018-08-23 DIAGNOSIS — N186 End stage renal disease: Secondary | ICD-10-CM | POA: Diagnosis not present

## 2018-08-23 DIAGNOSIS — D631 Anemia in chronic kidney disease: Secondary | ICD-10-CM | POA: Diagnosis not present

## 2018-08-23 DIAGNOSIS — Z992 Dependence on renal dialysis: Secondary | ICD-10-CM | POA: Diagnosis not present

## 2018-08-23 DIAGNOSIS — N2581 Secondary hyperparathyroidism of renal origin: Secondary | ICD-10-CM | POA: Diagnosis not present

## 2018-08-24 ENCOUNTER — Ambulatory Visit (INDEPENDENT_AMBULATORY_CARE_PROVIDER_SITE_OTHER): Payer: Medicare Other | Admitting: *Deleted

## 2018-08-24 ENCOUNTER — Encounter: Payer: Medicare Other | Admitting: Vascular Surgery

## 2018-08-24 ENCOUNTER — Encounter: Payer: Self-pay | Admitting: Physician Assistant

## 2018-08-24 ENCOUNTER — Ambulatory Visit (INDEPENDENT_AMBULATORY_CARE_PROVIDER_SITE_OTHER): Payer: Medicare Other | Admitting: Physician Assistant

## 2018-08-24 ENCOUNTER — Encounter (HOSPITAL_COMMUNITY): Payer: Medicare Other

## 2018-08-24 VITALS — BP 110/70 | HR 64 | Ht 65.0 in | Wt 160.8 lb

## 2018-08-24 DIAGNOSIS — I059 Rheumatic mitral valve disease, unspecified: Secondary | ICD-10-CM | POA: Diagnosis not present

## 2018-08-24 DIAGNOSIS — Z952 Presence of prosthetic heart valve: Secondary | ICD-10-CM

## 2018-08-24 DIAGNOSIS — I4821 Permanent atrial fibrillation: Secondary | ICD-10-CM

## 2018-08-24 DIAGNOSIS — I079 Rheumatic tricuspid valve disease, unspecified: Secondary | ICD-10-CM

## 2018-08-24 DIAGNOSIS — Z5181 Encounter for therapeutic drug level monitoring: Secondary | ICD-10-CM | POA: Diagnosis not present

## 2018-08-24 DIAGNOSIS — R319 Hematuria, unspecified: Secondary | ICD-10-CM | POA: Diagnosis not present

## 2018-08-24 DIAGNOSIS — I5042 Chronic combined systolic (congestive) and diastolic (congestive) heart failure: Secondary | ICD-10-CM

## 2018-08-24 DIAGNOSIS — I2510 Atherosclerotic heart disease of native coronary artery without angina pectoris: Secondary | ICD-10-CM | POA: Diagnosis not present

## 2018-08-24 LAB — POCT INR: INR: 4.5 — AB (ref 2.0–3.0)

## 2018-08-24 NOTE — Progress Notes (Signed)
Cardiology Office Note:    Date:  08/24/2018   ID:  Kutler Pinera, DOB [birth year], MRN [id number]  PCP:  Charlott Rakes, MD  Cardiologist:  [name], MD  Electrophysiologist:  None   Referring MD: Charlott Rakes, MD   Chief Complaint  Patient presents with  . Follow-up    Afib, CHF    History of Present Illness:    Michael [name] is a 57 y.o. male with atrial fibrillation, mitral valve disease status post bioprosthetic mitral valve replacement in 2012 at [facility] in [place], tricuspid valve repair, CAD, PAF, ESRD on dialysis, OSA, diabetes, HTN, HL, CHF, pulmonary hypertension, anemia, poor adherence with medications. Echo done in [place] in 4/12 with normal LVF (EF 60-65%). Admitted in 10/16 with AF with RVR and a/c systolic CHF. EF was 30-35%. His EF has improved back to normal by most recent echocardiogram.  He was followed fairly closely in the office recently with atrial fibrillation with rapid rate.  He was placed on Amiodarone.  He was last seen 08/05/18.  He was then admitted 9/20-9/26 with septic shock and hyperkalemia.  He was intubated and required CRRT until he recovered.  He was transitioned back to hemodialysis.    [name] returns for follow up.  He is here alone.  He denies shortness of breath, orthopnea, paroxysmal nocturnal dyspnea, syncope.  He has occasional chest pain described as tightness.  This seems to be related to meals.  He did have an episode of hematuria last week.  He has not seen any recurrence.  He has difficulty sleeping.  He notes some mild leg swelling.   Prior CV studies:   The following studies were reviewed today:  Echo 08/07/18 Mild LVH, EF 55-60, no RWMA, mod AI, MV prosthesis with mild stenosis, massive LAE, severe RAE, severe TR, PASP 66  Holter 06/01/18 Atrial fibrillation    90 to 157 bpm    Average HR 122 bpm Occasional PVC   No diary entries  Cardiac Catheterization 08/19/17 RPDA 100 LVEDP 14 PCWP 24  RPDA lesion,  100 %stenosed - unable to see the occlusion site, but collaterals filling the distal vessel are seen. Not a PCI option & likely not acute.  Otherwise minimal CAD elsewhere.  Hemodynamic findings consistent with at least moderate pulmonary hypertension with mildly elevated LV pressures  Nuclear stress test 08/14/17 IMPRESSION: 1. Area of reversibility involving the mid inferior wall suggests inducible ischemia. 2. Normal left ventricular wall motion. 3. Left ventricular ejection fraction 53% 4. Non invasive risk stratification*: Intermediate  R heart cath 06/29/17 1. Elevated right and left heart filling pressures, more prominent RV failure.  2. Pulmonary venous hypertension.  3. Cardiac output vigorous on milrinone.  Echo 09/11/15 Mild LVH, EF 30-35%, mitral valve bioprosthesis present without evidence of stenosis or regurgitation, severe LAE, mild RVE with mild to moderately reduced RVSF, moderate RAE   Past Medical History:  Diagnosis Date  . Anemia of chronic disease   . Asthma    said he does not know  . Chronic kidney disease (CKD), stage IV (severe) ([place])   . Chronic systolic CHF (congestive heart failure) (HCC)    a. prior EF normal; b. Echo 10/16: Mild LVH, EF 30-35%, mitral valve bioprosthesis present without evidence of stenosis or regurgitation, severe LAE, mild RVE with mild to moderately reduced RVSF, moderate RAE  . Cirrhosis ([place])   . Coronary artery disease   . Diabetes mellitus type 2 in obese ([place])   . Diabetes mellitus without  complication ([place])   . Dyspnea   . GERD (gastroesophageal reflux disease)   . H/O tricuspid valve repair 2012  . History of echocardiogram    Echo at [facility] in [facility] 4/12: mod LVH, EF 60-65%, mod LAE, tissue MVR ok, mod TR, mod pulmo HTN with RVSP 48 mmHg  . Hypertension   . Obstructive sleep apnea   . Paroxysmal atrial fibrillation (HCC)    s/p Maze procedure at time of MVR in 2011  . Pulmonary HTN ([name])   . S/P mitral valve  replacement with porcine valve 2012  . Thrombocytopenia ([place])    Surgical Hx: The patient  has a past surgical history that includes Cardiac surgery; RIGHT HEART CATH (N/A, 06/29/2017); TEE without cardioversion (N/A, 07/09/2017); Cardioversion (N/A, 07/09/2017); AV fistula placement (Left, 07/13/2017); Insertion of dialysis catheter (Right, 07/13/2017); Colonoscopy (N/A, 07/16/2017); Esophagogastroduodenoscopy (N/A, 07/16/2017); Cardiac valve replacement (2011); RIGHT/LEFT HEART CATH AND CORONARY ANGIOGRAPHY (N/A, 36/04/4402); and Bascilic vein transposition (Left, 11/03/2017).   Current Medications: Current Meds  Medication Sig  . acetaminophen (TYLENOL) 500 MG tablet Take 1,000 mg by mouth daily as needed for headache (pain).   Marland Kitchen amiodarone (PACERONE) 200 MG tablet TAKE 1 TABLET BY MOUTH TWICE A DAY  . atorvastatin (LIPITOR) 20 MG tablet TAKE 1 TABLET (20 MG TOTAL) BY MOUTH DAILY. PLEASE KEEP UPCOMING APPT. THANK YOU  . calcium acetate (PHOSLO) 667 MG capsule Take 1 capsule (667 mg total) by mouth 3 (three) times daily with meals. Please keep upcoming appt. Thank You  . colchicine 0.6 MG tablet Take 0.5 tablets (0.3 mg total) by mouth 2 (two) times a week.  . diazepam (VALIUM) 5 MG tablet Take 5 mg by mouth at bedtime as needed (sleep).  . diphenhydramine-acetaminophen (TYLENOL PM) 25-500 MG TABS tablet Take 1 tablet by mouth at bedtime as needed (pain).  . fluticasone (FLONASE) 50 MCG/ACT nasal spray Place 2 sprays into both nostrils daily.  . folic acid (FOLVITE) 1 MG tablet TAKE 1 TABLET BY MOUTH EVERY DAY  . gabapentin (NEURONTIN) 100 MG capsule Take 200 mg by mouth at bedtime. FOR LEG PAIN  . metoprolol tartrate (LOPRESSOR) 25 MG tablet Take 1 tablet (25 mg total) by mouth 2 (two) times daily.  . midodrine (PROAMATINE) 5 MG tablet Take 2 tablets (10 mg total) by mouth 3 (three) times daily with meals.  . multivitamin (RENA-VIT) TABS tablet Take 1 tablet by mouth at bedtime.  . OXYGEN Inhale 2 L  into the lungs daily as needed (shortness of breath).   Marland Kitchen tiZANidine (ZANAFLEX) 4 MG tablet Take 1 tablet (4 mg total) by mouth every 8 (eight) hours as needed for muscle spasms.  Marland Kitchen warfarin (COUMADIN) 2.5 MG tablet Take 1.5 tablets daily or as directed by Coumadin clinic (Patient taking differently: Take 3.75 mg by mouth daily. or as directed by Coumadin clinic)     Allergies:   Triamcinolone   Social History   Tobacco Use  . Smoking status: Former Smoker    Packs/day: 1.00    Types: Cigarettes  . Smokeless tobacco: Never Used  Substance Use Topics  . Alcohol use: No  . Drug use: No     Family Hx: The patient's family history is negative for Heart attack.  ROS:   Please see the history of present illness.    ROS All other systems reviewed and are negative.   EKGs/Labs/Other Test Reviewed:    EKG:  EKG is   ordered today.  The ekg ordered today demonstrates  atrial fibrillation, HR 64, RBBB, TWI V1-3, QTc 534, similar to old EKGs  Recent Labs: 11/23/2017: B Natriuretic Peptide 977.3 08/05/2018: TSH 2.600 08/06/2018: ALT 16 08/12/2018: BUN 22; Creatinine, Ser 3.97; Hemoglobin 7.9; Magnesium 2.2; Platelets 111; Potassium 3.7; Sodium 136   Recent Lipid Panel Lab Results  Component Value Date/Time   CHOL 85 11/24/2017 05:21 AM   CHOL 111 03/03/2017 11:52 AM   TRIG 76 11/24/2017 05:21 AM   HDL 28 (L) 11/24/2017 05:21 AM   HDL 36 (L) 03/03/2017 11:52 AM   CHOLHDL 3.0 11/24/2017 05:21 AM   LDLCALC 42 11/24/2017 05:21 AM   LDLCALC 62 03/03/2017 11:52 AM    Physical Exam:    VS:  BP 110/70   Pulse 64   Ht 5\' 5"  (1.651 m)   Wt 160 lb 12.8 oz (72.9 kg)   BMI 26.76 kg/m     Wt Readings from Last 3 Encounters:  08/24/18 160 lb 12.8 oz (72.9 kg)  08/11/18 151 lb 7.3 oz (68.7 kg)  08/05/18 155 lb 4 oz (70.4 kg)     Physical Exam  Constitutional: He is oriented to person, place, and time. He appears well-developed and well-nourished. No distress.  HENT:  Head:  Normocephalic and atraumatic.  Eyes: No scleral icterus.  Neck: No thyromegaly present.  Cardiovascular: Normal rate. An irregularly irregular rhythm present.  Murmur heard.  Systolic murmur is present with a grade of 3/6 at the lower left sternal border. Pulmonary/Chest: He has no rales.  Abdominal: Soft.  Musculoskeletal: He exhibits edema (trace bilat LE edema).  Lymphadenopathy:    He has no cervical adenopathy.  Neurological: He is alert and oriented to person, place, and time.  Skin: Skin is warm and dry.  Psychiatric: He has a normal mood and affect.    ASSESSMENT & PLAN:    Permanent atrial fibrillation  Rate is much better controlled now.  ALT and TSH were normal in the hospital.  I recommend remaining on the current dose of Amiodarone at 200 mg Twice daily.  We can consider reducing this to once daily at his next visit.  Continue warfarin, metoprolol tartrate.   Coronary artery disease involving native coronary artery of native heart without angina pectoris He has a occluded R PDA and no significant disease elsewhere on his Cardiac Catheterization in 08/2017.  He has occasional chest pain but this seems to be related to meals mostly.  Continue statin.  He is not on ASA as he is on Warfarin.  Chronic combined systolic and diastolic congestive heart failure (HCC) EF has been as low as 30-35 in the setting of atrial fibrillation with rapid rate.  His EF has recovered since then and his EF was 55-60 by the echo done in the hospital in 07/2018.  Volume is managed by hemodialysis.  Continue beta-blocker.  He has had issues with low blood pressure. There is no indication for ACE inhibitor/angiotensin receptor blocker.    S/P MVR (mitral valve replacement) S/p bioprosthetic MVR and TV repair.  His MVR prosthesis was stable during most recent echo with mild mitral stenosis.  Continue SBE prophylaxis.    Hematuria, unspecified type This occurred after his recent hospital stay.  He  likely had a foley catheter at some point and his INR was recently > 4.  I have asked him to follow up with primary care to have this further evaluated.     Dispo:  Return in about 3 months (around 11/24/2018) for Routine Follow Up, w/  Dr. Harrington Challenger, or Richardson Dopp, PA-C.   Medication Adjustments/Labs and Tests Ordered: Current medicines are reviewed at length with the patient today.  Concerns regarding medicines are outlined above.  Tests Ordered: Orders Placed This Encounter  Procedures  . EKG 12-Lead   Medication Changes: No orders of the defined types were placed in this encounter.   Signed, Richardson Dopp, PA-C  08/24/2018 5:06 PM    [facility] [address], [place], [place]  [address] Phone: [phone]; Fax: [phone]

## 2018-08-24 NOTE — Patient Instructions (Signed)
Description   Skip tomorrow's dose, then start taking 1.5 tablets everyday except 1 tablet on Tuesdays, Thursdays, and Saturday.   Recheck INR in 1week.  Coumadin Clinic [phone] Pt's new number is [phone]

## 2018-08-24 NOTE — Patient Instructions (Signed)
Medication Instructions:  Your physician recommends that you continue on your current medications as directed. Please refer to the Current Medication list given to you today.  If you need a refill on your cardiac medications before your next appointment, please call your pharmacy.   Lab work: NONE ORDERED TODAY If you have labs (blood work) drawn today and your tests are completely normal, you will receive your results only by: Marland Kitchen MyChart Message (if you have MyChart) OR . A paper copy in the mail If you have any lab test that is abnormal or we need to change your treatment, we will call you to review the results.  Testing/Procedures: NONE ORDERED TODAY  Follow-Up: At [facility], you and your health needs are our priority.  As part of our continuing mission to provide you with exceptional heart care, we have created designated Provider Care Teams.  These Care Teams include your primary Cardiologist (physician) and Advanced Practice Providers (APPs -  Physician Assistants and Nurse Practitioners) who all work together to provide you with the care you need, when you need it. You will need a follow up appointment in:  3 months.  Please call our office 2 months in advance to schedule this appointment.  You may see [name], MD or one of the following Advanced Practice Providers on your designated Care Team: Richardson Dopp, PA-C [place], [place] . Daune Perch, NP  Any Other Special Instructions Will Be Listed Below (If Applicable). PLEASE FOLLOW UP WITH YOUR PRIMARY CARE PHYSICIAN AS TO BLOOD IN YOUR URINE.

## 2018-08-25 DIAGNOSIS — E876 Hypokalemia: Secondary | ICD-10-CM | POA: Diagnosis not present

## 2018-08-25 DIAGNOSIS — D631 Anemia in chronic kidney disease: Secondary | ICD-10-CM | POA: Diagnosis not present

## 2018-08-25 DIAGNOSIS — N2581 Secondary hyperparathyroidism of renal origin: Secondary | ICD-10-CM | POA: Diagnosis not present

## 2018-08-25 DIAGNOSIS — N186 End stage renal disease: Secondary | ICD-10-CM | POA: Diagnosis not present

## 2018-08-25 DIAGNOSIS — D509 Iron deficiency anemia, unspecified: Secondary | ICD-10-CM | POA: Diagnosis not present

## 2018-08-25 DIAGNOSIS — I482 Chronic atrial fibrillation, unspecified: Secondary | ICD-10-CM | POA: Diagnosis not present

## 2018-08-25 DIAGNOSIS — Z992 Dependence on renal dialysis: Secondary | ICD-10-CM | POA: Diagnosis not present

## 2018-08-27 DIAGNOSIS — E876 Hypokalemia: Secondary | ICD-10-CM | POA: Diagnosis not present

## 2018-08-27 DIAGNOSIS — N186 End stage renal disease: Secondary | ICD-10-CM | POA: Diagnosis not present

## 2018-08-27 DIAGNOSIS — D631 Anemia in chronic kidney disease: Secondary | ICD-10-CM | POA: Diagnosis not present

## 2018-08-27 DIAGNOSIS — Z992 Dependence on renal dialysis: Secondary | ICD-10-CM | POA: Diagnosis not present

## 2018-08-27 DIAGNOSIS — D509 Iron deficiency anemia, unspecified: Secondary | ICD-10-CM | POA: Diagnosis not present

## 2018-08-27 DIAGNOSIS — N2581 Secondary hyperparathyroidism of renal origin: Secondary | ICD-10-CM | POA: Diagnosis not present

## 2018-08-28 DIAGNOSIS — N186 End stage renal disease: Secondary | ICD-10-CM | POA: Diagnosis not present

## 2018-08-28 DIAGNOSIS — D509 Iron deficiency anemia, unspecified: Secondary | ICD-10-CM | POA: Diagnosis not present

## 2018-08-28 DIAGNOSIS — D631 Anemia in chronic kidney disease: Secondary | ICD-10-CM | POA: Diagnosis not present

## 2018-08-28 DIAGNOSIS — N2581 Secondary hyperparathyroidism of renal origin: Secondary | ICD-10-CM | POA: Diagnosis not present

## 2018-08-28 DIAGNOSIS — Z992 Dependence on renal dialysis: Secondary | ICD-10-CM | POA: Diagnosis not present

## 2018-08-28 DIAGNOSIS — E876 Hypokalemia: Secondary | ICD-10-CM | POA: Diagnosis not present

## 2018-08-30 DIAGNOSIS — D631 Anemia in chronic kidney disease: Secondary | ICD-10-CM | POA: Diagnosis not present

## 2018-08-30 DIAGNOSIS — D509 Iron deficiency anemia, unspecified: Secondary | ICD-10-CM | POA: Diagnosis not present

## 2018-08-30 DIAGNOSIS — E876 Hypokalemia: Secondary | ICD-10-CM | POA: Diagnosis not present

## 2018-08-30 DIAGNOSIS — N2581 Secondary hyperparathyroidism of renal origin: Secondary | ICD-10-CM | POA: Diagnosis not present

## 2018-08-30 DIAGNOSIS — Z992 Dependence on renal dialysis: Secondary | ICD-10-CM | POA: Diagnosis not present

## 2018-08-30 DIAGNOSIS — N186 End stage renal disease: Secondary | ICD-10-CM | POA: Diagnosis not present

## 2018-08-31 ENCOUNTER — Ambulatory Visit (INDEPENDENT_AMBULATORY_CARE_PROVIDER_SITE_OTHER): Payer: Medicare Other | Admitting: *Deleted

## 2018-08-31 DIAGNOSIS — I079 Rheumatic tricuspid valve disease, unspecified: Secondary | ICD-10-CM | POA: Diagnosis not present

## 2018-08-31 DIAGNOSIS — Z5181 Encounter for therapeutic drug level monitoring: Secondary | ICD-10-CM

## 2018-08-31 DIAGNOSIS — I059 Rheumatic mitral valve disease, unspecified: Secondary | ICD-10-CM

## 2018-08-31 LAB — POCT INR: INR: 3.5 — AB (ref 2.0–3.0)

## 2018-08-31 NOTE — Patient Instructions (Signed)
Description   Skip tomorrow's dose, then start taking 1 tablet everyday except 1.5 tablets on Tuesdays and Saturdays. Recheck INR in 1 week.  Coumadin Clinic [phone] Pt's new number is [phone]

## 2018-09-01 DIAGNOSIS — Z992 Dependence on renal dialysis: Secondary | ICD-10-CM | POA: Diagnosis not present

## 2018-09-01 DIAGNOSIS — I482 Chronic atrial fibrillation, unspecified: Secondary | ICD-10-CM | POA: Diagnosis not present

## 2018-09-01 DIAGNOSIS — N2581 Secondary hyperparathyroidism of renal origin: Secondary | ICD-10-CM | POA: Diagnosis not present

## 2018-09-01 DIAGNOSIS — E876 Hypokalemia: Secondary | ICD-10-CM | POA: Diagnosis not present

## 2018-09-01 DIAGNOSIS — D509 Iron deficiency anemia, unspecified: Secondary | ICD-10-CM | POA: Diagnosis not present

## 2018-09-01 DIAGNOSIS — N186 End stage renal disease: Secondary | ICD-10-CM | POA: Diagnosis not present

## 2018-09-01 DIAGNOSIS — D631 Anemia in chronic kidney disease: Secondary | ICD-10-CM | POA: Diagnosis not present

## 2018-09-03 DIAGNOSIS — D631 Anemia in chronic kidney disease: Secondary | ICD-10-CM | POA: Diagnosis not present

## 2018-09-03 DIAGNOSIS — D509 Iron deficiency anemia, unspecified: Secondary | ICD-10-CM | POA: Diagnosis not present

## 2018-09-03 DIAGNOSIS — E876 Hypokalemia: Secondary | ICD-10-CM | POA: Diagnosis not present

## 2018-09-03 DIAGNOSIS — N186 End stage renal disease: Secondary | ICD-10-CM | POA: Diagnosis not present

## 2018-09-03 DIAGNOSIS — Z992 Dependence on renal dialysis: Secondary | ICD-10-CM | POA: Diagnosis not present

## 2018-09-03 DIAGNOSIS — N2581 Secondary hyperparathyroidism of renal origin: Secondary | ICD-10-CM | POA: Diagnosis not present

## 2018-09-04 DIAGNOSIS — Z992 Dependence on renal dialysis: Secondary | ICD-10-CM | POA: Diagnosis not present

## 2018-09-04 DIAGNOSIS — N186 End stage renal disease: Secondary | ICD-10-CM | POA: Diagnosis not present

## 2018-09-04 DIAGNOSIS — D509 Iron deficiency anemia, unspecified: Secondary | ICD-10-CM | POA: Diagnosis not present

## 2018-09-04 DIAGNOSIS — E876 Hypokalemia: Secondary | ICD-10-CM | POA: Diagnosis not present

## 2018-09-04 DIAGNOSIS — N2581 Secondary hyperparathyroidism of renal origin: Secondary | ICD-10-CM | POA: Diagnosis not present

## 2018-09-04 DIAGNOSIS — D631 Anemia in chronic kidney disease: Secondary | ICD-10-CM | POA: Diagnosis not present

## 2018-09-06 DIAGNOSIS — I482 Chronic atrial fibrillation, unspecified: Secondary | ICD-10-CM | POA: Diagnosis not present

## 2018-09-06 DIAGNOSIS — N2581 Secondary hyperparathyroidism of renal origin: Secondary | ICD-10-CM | POA: Diagnosis not present

## 2018-09-06 DIAGNOSIS — N186 End stage renal disease: Secondary | ICD-10-CM | POA: Diagnosis not present

## 2018-09-06 DIAGNOSIS — Z992 Dependence on renal dialysis: Secondary | ICD-10-CM | POA: Diagnosis not present

## 2018-09-06 DIAGNOSIS — D509 Iron deficiency anemia, unspecified: Secondary | ICD-10-CM | POA: Diagnosis not present

## 2018-09-06 DIAGNOSIS — E876 Hypokalemia: Secondary | ICD-10-CM | POA: Diagnosis not present

## 2018-09-06 DIAGNOSIS — D631 Anemia in chronic kidney disease: Secondary | ICD-10-CM | POA: Diagnosis not present

## 2018-09-07 ENCOUNTER — Other Ambulatory Visit: Payer: Self-pay | Admitting: Physician Assistant

## 2018-09-08 DIAGNOSIS — D509 Iron deficiency anemia, unspecified: Secondary | ICD-10-CM | POA: Diagnosis not present

## 2018-09-08 DIAGNOSIS — Z992 Dependence on renal dialysis: Secondary | ICD-10-CM | POA: Diagnosis not present

## 2018-09-08 DIAGNOSIS — N2581 Secondary hyperparathyroidism of renal origin: Secondary | ICD-10-CM | POA: Diagnosis not present

## 2018-09-08 DIAGNOSIS — E876 Hypokalemia: Secondary | ICD-10-CM | POA: Diagnosis not present

## 2018-09-08 DIAGNOSIS — E119 Type 2 diabetes mellitus without complications: Secondary | ICD-10-CM | POA: Diagnosis not present

## 2018-09-08 DIAGNOSIS — D631 Anemia in chronic kidney disease: Secondary | ICD-10-CM | POA: Diagnosis not present

## 2018-09-08 DIAGNOSIS — N186 End stage renal disease: Secondary | ICD-10-CM | POA: Diagnosis not present

## 2018-09-09 ENCOUNTER — Ambulatory Visit (INDEPENDENT_AMBULATORY_CARE_PROVIDER_SITE_OTHER): Payer: Medicare Other | Admitting: *Deleted

## 2018-09-09 DIAGNOSIS — I079 Rheumatic tricuspid valve disease, unspecified: Secondary | ICD-10-CM | POA: Diagnosis not present

## 2018-09-09 DIAGNOSIS — Z5181 Encounter for therapeutic drug level monitoring: Secondary | ICD-10-CM

## 2018-09-09 DIAGNOSIS — I059 Rheumatic mitral valve disease, unspecified: Secondary | ICD-10-CM | POA: Diagnosis not present

## 2018-09-09 LAB — POCT INR: INR: 2.3 (ref 2.0–3.0)

## 2018-09-09 NOTE — Patient Instructions (Signed)
Description   Start taking 1 tablet everyday.  Recheck INR in 1 week.  Coumadin Clinic [phone] Pt's new number is [phone]

## 2018-09-10 DIAGNOSIS — E876 Hypokalemia: Secondary | ICD-10-CM | POA: Diagnosis not present

## 2018-09-10 DIAGNOSIS — N2581 Secondary hyperparathyroidism of renal origin: Secondary | ICD-10-CM | POA: Diagnosis not present

## 2018-09-10 DIAGNOSIS — D631 Anemia in chronic kidney disease: Secondary | ICD-10-CM | POA: Diagnosis not present

## 2018-09-10 DIAGNOSIS — D509 Iron deficiency anemia, unspecified: Secondary | ICD-10-CM | POA: Diagnosis not present

## 2018-09-10 DIAGNOSIS — Z992 Dependence on renal dialysis: Secondary | ICD-10-CM | POA: Diagnosis not present

## 2018-09-10 DIAGNOSIS — N186 End stage renal disease: Secondary | ICD-10-CM | POA: Diagnosis not present

## 2018-09-11 DIAGNOSIS — D509 Iron deficiency anemia, unspecified: Secondary | ICD-10-CM | POA: Diagnosis not present

## 2018-09-11 DIAGNOSIS — E876 Hypokalemia: Secondary | ICD-10-CM | POA: Diagnosis not present

## 2018-09-11 DIAGNOSIS — N2581 Secondary hyperparathyroidism of renal origin: Secondary | ICD-10-CM | POA: Diagnosis not present

## 2018-09-11 DIAGNOSIS — D631 Anemia in chronic kidney disease: Secondary | ICD-10-CM | POA: Diagnosis not present

## 2018-09-11 DIAGNOSIS — N186 End stage renal disease: Secondary | ICD-10-CM | POA: Diagnosis not present

## 2018-09-11 DIAGNOSIS — Z992 Dependence on renal dialysis: Secondary | ICD-10-CM | POA: Diagnosis not present

## 2018-09-13 ENCOUNTER — Emergency Department (HOSPITAL_COMMUNITY)
Admission: EM | Admit: 2018-09-13 | Discharge: 2018-09-13 | Disposition: A | Discharge status: Home / Self Care | Payer: Medicare Other | Attending: Emergency Medicine | Admitting: Emergency Medicine

## 2018-09-13 ENCOUNTER — Encounter (HOSPITAL_COMMUNITY): Payer: Self-pay | Admitting: Emergency Medicine

## 2018-09-13 ENCOUNTER — Emergency Department (HOSPITAL_COMMUNITY): Payer: Medicare Other

## 2018-09-13 DIAGNOSIS — Z87891 Personal history of nicotine dependence: Secondary | ICD-10-CM | POA: Insufficient documentation

## 2018-09-13 DIAGNOSIS — D509 Iron deficiency anemia, unspecified: Secondary | ICD-10-CM | POA: Diagnosis not present

## 2018-09-13 DIAGNOSIS — Z992 Dependence on renal dialysis: Secondary | ICD-10-CM | POA: Insufficient documentation

## 2018-09-13 DIAGNOSIS — J45909 Unspecified asthma, uncomplicated: Secondary | ICD-10-CM | POA: Insufficient documentation

## 2018-09-13 DIAGNOSIS — I5022 Chronic systolic (congestive) heart failure: Secondary | ICD-10-CM | POA: Insufficient documentation

## 2018-09-13 DIAGNOSIS — N2581 Secondary hyperparathyroidism of renal origin: Secondary | ICD-10-CM | POA: Diagnosis not present

## 2018-09-13 DIAGNOSIS — Z79899 Other long term (current) drug therapy: Secondary | ICD-10-CM | POA: Diagnosis not present

## 2018-09-13 DIAGNOSIS — I959 Hypotension, unspecified: Secondary | ICD-10-CM | POA: Diagnosis not present

## 2018-09-13 DIAGNOSIS — E861 Hypovolemia: Secondary | ICD-10-CM | POA: Insufficient documentation

## 2018-09-13 DIAGNOSIS — D631 Anemia in chronic kidney disease: Secondary | ICD-10-CM | POA: Diagnosis not present

## 2018-09-13 DIAGNOSIS — N186 End stage renal disease: Secondary | ICD-10-CM | POA: Diagnosis not present

## 2018-09-13 DIAGNOSIS — I132 Hypertensive heart and chronic kidney disease with heart failure and with stage 5 chronic kidney disease, or end stage renal disease: Secondary | ICD-10-CM | POA: Diagnosis not present

## 2018-09-13 DIAGNOSIS — I9589 Other hypotension: Secondary | ICD-10-CM | POA: Insufficient documentation

## 2018-09-13 DIAGNOSIS — Z7901 Long term (current) use of anticoagulants: Secondary | ICD-10-CM | POA: Diagnosis not present

## 2018-09-13 DIAGNOSIS — R52 Pain, unspecified: Secondary | ICD-10-CM | POA: Diagnosis not present

## 2018-09-13 DIAGNOSIS — E876 Hypokalemia: Secondary | ICD-10-CM | POA: Diagnosis not present

## 2018-09-13 DIAGNOSIS — I482 Chronic atrial fibrillation, unspecified: Secondary | ICD-10-CM | POA: Diagnosis not present

## 2018-09-13 DIAGNOSIS — E1122 Type 2 diabetes mellitus with diabetic chronic kidney disease: Secondary | ICD-10-CM | POA: Insufficient documentation

## 2018-09-13 DIAGNOSIS — J90 Pleural effusion, not elsewhere classified: Secondary | ICD-10-CM | POA: Diagnosis not present

## 2018-09-13 LAB — CBC WITH DIFFERENTIAL/PLATELET
Abs Immature Granulocytes: 0.01 10*3/uL (ref 0.00–0.07)
BASOS ABS: 0 10*3/uL (ref 0.0–0.1)
Basophils Relative: 1 %
EOS ABS: 0.2 10*3/uL (ref 0.0–0.5)
Eosinophils Relative: 6 %
HEMATOCRIT: 32.6 % — AB (ref 39.0–52.0)
HEMOGLOBIN: 9.7 g/dL — AB (ref 13.0–17.0)
IMMATURE GRANULOCYTES: 0 %
LYMPHS ABS: 0.9 10*3/uL (ref 0.7–4.0)
LYMPHS PCT: 25 %
MCH: 26.7 pg (ref 26.0–34.0)
MCHC: 29.8 g/dL — AB (ref 30.0–36.0)
MCV: 89.8 fL (ref 80.0–100.0)
Monocytes Absolute: 0.5 10*3/uL (ref 0.1–1.0)
Monocytes Relative: 15 %
NEUTROS PCT: 53 %
NRBC: 0 % (ref 0.0–0.2)
Neutro Abs: 1.9 10*3/uL (ref 1.7–7.7)
Platelets: 87 10*3/uL — ABNORMAL LOW (ref 150–400)
RBC: 3.63 MIL/uL — AB (ref 4.22–5.81)
RDW: 16.6 % — AB (ref 11.5–15.5)
WBC: 3.5 10*3/uL — AB (ref 4.0–10.5)

## 2018-09-13 LAB — I-STAT CHEM 8, ED
BUN: 26 mg/dL — ABNORMAL HIGH (ref 6–20)
CREATININE: 3.6 mg/dL — AB (ref 0.61–1.24)
Calcium, Ion: 1.12 mmol/L — ABNORMAL LOW (ref 1.15–1.40)
Chloride: 100 mmol/L (ref 98–111)
Glucose, Bld: 86 mg/dL (ref 70–99)
HEMATOCRIT: 33 % — AB (ref 39.0–52.0)
HEMOGLOBIN: 11.2 g/dL — AB (ref 13.0–17.0)
POTASSIUM: 3.7 mmol/L (ref 3.5–5.1)
SODIUM: 141 mmol/L (ref 135–145)
TCO2: 31 mmol/L (ref 22–32)

## 2018-09-13 NOTE — Discharge Instructions (Addendum)
Testing today did not show any persistent problem with low blood pressure.  Your nephrologist will continue to work with you to try to get more fluid off and improve your fluid status.  Follow-up with them as directed.  You can call in the morning to arrange your next time for dialysis.

## 2018-09-13 NOTE — ED Notes (Signed)
Patient transported to X-ray 

## 2018-09-13 NOTE — ED Triage Notes (Signed)
Pt arrives via EMS from HD where staff reports his BP was low during treatment. States BP was in the 17H systolic. BP 102/64 for EMS. Pt denies pain. States he did not sleep well last night.

## 2018-09-13 NOTE — ED Provider Notes (Signed)
[facility] EMERGENCY DEPARTMENT Provider Note   CSN: [id number] Arrival date & time: 09/13/18  1716     History   Chief Complaint Chief Complaint  Patient presents with  . Hypotension  . Fatigue    HPI Michael [name] is a 57 y.o. male.  HPI   Patient presents for evaluation of hypertension, which occurred while he is being dialyzed today.  He has been being treated at dialysis 4 times a week because of weight gain and inability to reach his dry weight.  He was sent here by the renal doctor for further evaluation and treatment.  Patient denies fever, chills, cough, shortness of breath, chest pain, focal weakness or paresthesia.  States he is taking his usual medicines and compliant with his treatment recommendations.  There are no other known modifying factors.  Past Medical History:  Diagnosis Date  . Anemia of chronic disease   . Asthma    said he does not know  . Chronic kidney disease (CKD), stage IV (severe) ([place])   . Chronic systolic CHF (congestive heart failure) (HCC)    a. prior EF normal; b. Echo 10/16: Mild LVH, EF 30-35%, mitral valve bioprosthesis present without evidence of stenosis or regurgitation, severe LAE, mild RVE with mild to moderately reduced RVSF, moderate RAE  . Cirrhosis ([place])   . Coronary artery disease   . Diabetes mellitus type 2 in obese ([place])   . Diabetes mellitus without complication ([place])   . Dyspnea   . GERD (gastroesophageal reflux disease)   . H/O tricuspid valve repair 2012  . History of echocardiogram    Echo at [facility] 4/12: mod LVH, EF 60-65%, mod LAE, tissue MVR ok, mod TR, mod pulmo HTN with RVSP 48 mmHg  . Hypertension   . Obstructive sleep apnea   . Paroxysmal atrial fibrillation (HCC)    s/p Maze procedure at time of MVR in 2011  . Pulmonary HTN ([place])   . 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 Thrombocytopenia [facility])     Patient Active Problem List   Diagnosis Date Noted    . Septic shock ([place])   . Acute encephalopathy 08/06/2018  . Pleural effusion   . SIRS (systemic inflammatory response syndrome) ([place]) 12/01/2017  . ESRD (end stage renal disease) ([place]) 12/01/2017  . CAD (coronary artery disease) 11/24/2017  . Hyperkalemia 11/24/2017  . Fluid overload 11/24/2017  . Elevated troponin 11/24/2017  . Dyspnea 09/18/2017  . Chronic renal failure   . Advance care planning   . Pulmonary hypertension, unspecified ([place])   . Chest pain   . Biventricular heart failure ([place]) 08/10/2017  . Syncope and collapse 08/10/2017  . Cardiac arrest ([place])   . Benign neoplasm of ascending colon   . Rectal bleeding   . ESRD (end stage renal disease) on dialysis ([place])   . Goals of care, counseling/discussion   . Palliative care by specialist   . Acute kidney injury ([place]) 06/25/2017  . Anasarca 05/30/2017  . HLD (hyperlipidemia) 10/05/2016  . Mouth bleeding 10/05/2016  . Bilateral knee pain 10/05/2016  . Petechial rash 10/05/2016  . Laceration of tongue   . Insomnia 06/10/2016  . Supratherapeutic INR 06/01/2016  . Acute hepatic encephalopathy 03/15/2016  . Liver cirrhosis ([place]) 03/15/2016  . Volume overload 03/15/2016  . Acute congestive heart failure ([place])   . Insulin dependent diabetes mellitus ([place])   . Acute renal failure (ARF) ([place]) 03/14/2016  . GERD (gastroesophageal reflux disease)  03/04/2016  . Aortic regurgitation 02/27/2016  . Viral gastroenteritis 02/27/2016  . A-fib ([place]) 02/26/2016  . Abdominal pain 02/25/2016  . Diabetes mellitus with complication ([place]) 09/81/1914  . Permanent atrial fibrillation 02/25/2016  . Congestive heart failure (CHF) ([place]) 02/25/2016  . Anemia, chronic disease 02/25/2016  . Atrial fibrillation with RVR ([place]) 02/19/2016  . Pain in joint, ankle and foot 01/10/2016  . Asthma   . Acute hypoxemic respiratory failure ([place]) 12/29/2015  . Acute respiratory failure ([place]) 12/29/2015  . Chronic atrial fibrillation 12/24/2015  .  Dilated cardiomyopathy ([place]) 12/24/2015  . Polypharmacy 11/23/2015  . Gout 10/23/2015  . Encounter for therapeutic drug monitoring 10/19/2015  . Anemia - mild exacerbation 10/15/2015  . Tricuspid valve disease 10/09/2015  . Mitral valve disease   . Calf pain 09/10/2015  . Type II diabetes mellitus with renal manifestations ([place]) 09/10/2015  . Essential hypertension 09/10/2015    Past Surgical History:  Procedure Laterality Date  . AV FISTULA PLACEMENT Left 07/13/2017   Procedure: LEFT ARM ARTERIOVENOUS (AV) FISTULA CREATION;  Surgeon: Waynetta Sandy, MD;  Location: [facility];  Service: Vascular;  Laterality: Left;  . BASCILIC VEIN TRANSPOSITION Left 11/03/2017   Procedure: BASILIC VEIN TRANSPOSITION SECOND STAGE;  Surgeon: Waynetta Sandy, MD;  Location: [facility];  Service: Vascular;  Laterality: Left;  . CARDIAC SURGERY    . CARDIAC VALVE REPLACEMENT  2011  . CARDIOVERSION N/A 07/09/2017   Procedure: CARDIOVERSION;  Surgeon: Larey Dresser, MD;  Location: [facility] ENDOSCOPY;  Service: Cardiovascular;  Laterality: N/A;  . COLONOSCOPY N/A 07/16/2017   Procedure: COLONOSCOPY;  Surgeon: Jerene Bears, MD;  Location: [facility];  Service: Gastroenterology;  Laterality: N/A;  . ESOPHAGOGASTRODUODENOSCOPY N/A 07/16/2017   Procedure: ESOPHAGOGASTRODUODENOSCOPY (EGD);  Surgeon: Jerene Bears, MD;  Location: [facility] ENDOSCOPY;  Service: Gastroenterology;  Laterality: N/A;  . INSERTION OF DIALYSIS CATHETER Right 07/13/2017   Procedure: INSERTION OF DIALYSIS CATHETER RIGHT INTERNAL JUGULAR;  Surgeon: Waynetta Sandy, MD;  Location: [facility];  Service: Vascular;  Laterality: Right;  . RIGHT HEART CATH N/A 06/29/2017   Procedure: RIGHT HEART CATH;  Surgeon: Larey Dresser, MD;  Location: [name] CV LAB;  Service: Cardiovascular;  Laterality: N/A;  . RIGHT/LEFT HEART CATH AND CORONARY ANGIOGRAPHY N/A 08/19/2017   Procedure: RIGHT/LEFT HEART CATH AND CORONARY ANGIOGRAPHY;  Surgeon:  Leonie Man, MD;  Location: [facility] CV LAB;  Service: Cardiovascular;  Laterality: N/A;  . TEE WITHOUT CARDIOVERSION N/A 07/09/2017   Procedure: TRANSESOPHAGEAL ECHOCARDIOGRAM (TEE);  Surgeon: Larey Dresser, MD;  Location: [facility] ENDOSCOPY;  Service: Cardiovascular;  Laterality: N/A;        Home Medications    Prior to Admission medications   Medication Sig Start Date End Date Taking? Authorizing Provider  acetaminophen (TYLENOL) 500 MG tablet Take 1,000 mg by mouth daily as needed for headache (pain).     [provider]  amiodarone (PACERONE) 200 MG tablet TAKE 1 TABLET BY MOUTH TWICE A DAY 07/14/18   Fay Records, MD  atorvastatin (LIPITOR) 20 MG tablet TAKE 1 TABLET (20 MG TOTAL) BY MOUTH DAILY. PLEASE KEEP UPCOMING APPT. [place] YOU 06/07/18   Fay Records, MD  calcium acetate (PHOSLO) 667 MG capsule Take 1 capsule (667 mg total) by mouth 3 (three) times daily with meals. Please keep upcoming appt. Thank You 03/09/18   Fay Records, MD  colchicine 0.6 MG tablet Take 0.5 tablets (0.3 mg total) by mouth 2 (two) times a week.  09/21/17   Tawny Asal, MD  diazepam (VALIUM) 5 MG tablet Take 5 mg by mouth at bedtime as needed (sleep).    [provider]  diphenhydramine-acetaminophen (TYLENOL PM) 25-500 MG TABS tablet Take 1 tablet by mouth at bedtime as needed (pain).    [provider]  fluticasone (FLONASE) 50 MCG/ACT nasal spray Place 2 sprays into both nostrils daily. 12/14/17   Bonnielee Haff, MD  folic acid (FOLVITE) 1 MG tablet TAKE 1 TABLET BY MOUTH EVERY DAY 03/03/18   Charlott Rakes, MD  gabapentin (NEURONTIN) 100 MG capsule Take 200 mg by mouth at bedtime. FOR LEG PAIN    [provider]  metoprolol tartrate (LOPRESSOR) 25 MG tablet Take 1 tablet (25 mg total) by mouth 2 (two) times daily. 07/22/18   Daune Perch, NP  multivitamin (RENA-VIT) TABS tablet Take 1 tablet by mouth at bedtime. 12/14/17   Bonnielee Haff, MD  OXYGEN Inhale 2 L into  the lungs daily as needed (shortness of breath).     [provider]  tiZANidine (ZANAFLEX) 4 MG tablet Take 1 tablet (4 mg total) by mouth every 8 (eight) hours as needed for muscle spasms. 12/24/17   Charlott Rakes, MD  warfarin (COUMADIN) 2.5 MG tablet TAKE  AS DIRECTED BY COUMADIN CLINIC 09/07/18   Fay Records, MD    Family History Family History  Problem Relation Age of Onset  . Heart attack Neg Hx     Social History Social History   Tobacco Use  . Smoking status: Former Smoker    Packs/day: 1.00    Types: Cigarettes  . Smokeless tobacco: Never Used  Substance Use Topics  . Alcohol use: No  . Drug use: No     Allergies   Triamcinolone   Review of Systems Review of Systems  All other systems reviewed and are negative.    Physical Exam Updated Vital Signs BP 98/74   Pulse 62   Temp 97.8 F (36.6 C) (Oral)   Resp 18   SpO2 97%   Physical Exam  Constitutional: He is oriented to person, place, and time. He appears well-developed and well-nourished.  HENT:  Head: Normocephalic and atraumatic.  Right Ear: External ear normal.  Left Ear: External ear normal.  Eyes: Pupils are equal, round, and reactive to light. Conjunctivae and EOM are normal.  Neck: Normal range of motion and phonation normal. Neck supple.  Cardiovascular: Normal rate, regular rhythm and normal heart sounds.  Left upper arm fistula with normal thrill, nontender and no associated swelling.  Pulmonary/Chest: Effort normal and breath sounds normal. No respiratory distress. He exhibits no bony tenderness.  Abdominal: Soft. There is no tenderness.  Musculoskeletal: Normal range of motion.  Neurological: He is alert and oriented to person, place, and time. No cranial nerve deficit or sensory deficit. He exhibits normal muscle tone. Coordination normal.  Skin: Skin is warm, dry and intact.  Psychiatric: He has a normal mood and affect. His behavior is normal. Judgment and thought content  normal.  Nursing note and vitals reviewed.    ED Treatments / Results  Labs (all labs ordered are listed, but only abnormal results are displayed) Labs Reviewed  I-STAT CHEM 8, ED - Abnormal; Notable for the following components:      Result Value   BUN 26 (*)    Creatinine, Ser 3.60 (*)    Calcium, Ion 1.12 (*)    Hemoglobin 11.2 (*)    HCT 33.0 (*)    All  other components within normal limits  CBC WITH DIFFERENTIAL/PLATELET    EKG EKG Interpretation  Date/Time:  Monday September 13 2018 17:16:49 EDT Ventricular Rate:  53 PR Interval:    QRS Duration: 144 QT Interval:  604 QTC Calculation: 568 R Axis:   -12 Text Interpretation:  Atrial fibrillation Right bundle branch block since last tracing no significant change Confirmed by Daleen Bo [phone]) on 09/13/2018 5:38:22 PM   Radiology Dg Chest 2 View  Result Date: 09/13/2018 CLINICAL DATA:  Hypotension. EXAM: CHEST - 2 VIEW COMPARISON:  Radiograph of August 07, 2018. FINDINGS: Stable cardiomegaly no pneumothorax is noted. Left lung is clear. Atherosclerosis of thoracic aorta is noted. Endotracheal and nasogastric tubes have been removed. Moderate right pleural effusion is noted with underlying atelectasis. Bony thorax is unremarkable. Sternotomy wires are noted. IMPRESSION: Moderate right pleural effusion is noted with underlying atelectasis. Aortic Atherosclerosis (ICD10-I70.0). Electronically Signed   By: Marijo Conception, M.D.   On: 09/13/2018 20:48    Procedures Procedures (including critical care time)  Medications Ordered in ED Medications - No data to display   Initial Impression / Assessment and Plan / ED Course  I have reviewed the triage vital signs and the nursing notes.  Pertinent labs & imaging results that were available during my care of the patient were reviewed by me and considered in my medical decision making (see chart for details).  Clinical Course as of Sep 13 2121  Mon Sep 13, 2018  2112  Case was discussed with Dr. Joelyn Oms, nephrologist, regarding need for additional evaluation and treatment.  Patient's blood pressures improved spontaneously, and he does not appear toxic or in need of aggressive management or further ED or hospital care.   [EW]  2118 Normal except BUN high, creatinine high, calcium low, hemoglobin low  I-stat Chem 8, ED(!) [EW]    Clinical Course User Index [EW] Daleen Bo, MD     Patient Vitals for the past 24 hrs:  BP Temp Temp src Pulse Resp SpO2  09/13/18 1845 98/74 - - 62 18 97 %  09/13/18 1815 99/68 - - 68 17 97 %  09/13/18 1800 114/74 - - 66 20 99 %  09/13/18 1745 (!) 93/59 - - 78 (!) 21 99 %  09/13/18 1718 103/65 97.8 F (36.6 C) Oral 66 (!) 24 100 %    9:18 PM Reevaluation with update and discussion. After initial assessment and treatment, an updated evaluation reveals he remains comfortable. He has eaten. Findings discussed and questions answered. Daleen Bo   Medical Decision Making: Patient with end-stage renal disease, felt to be overdriving sent for evaluation to the emergency department.  CRITICAL CARE- no Performed by: Daleen Bo   Nursing Notes Reviewed/ Care Coordinated Applicable Imaging Reviewed Interpretation of Laboratory Data incorporated into ED treatment  The patient appears reasonably screened and/or stabilized for discharge and I doubt any other medical condition or other [facility] requiring further screening, evaluation, or treatment in the ED at this time prior to discharge.  Plan: Home Medications-continue usual; Home Treatments-rest, fluids; return here if the recommended treatment, does not improve the symptoms; Recommended follow up-PCP, PRN     Final Clinical Impressions(s) / ED Diagnoses   Final diagnoses:  Hypotension due to hypovolemia    ED Discharge Orders    None       Daleen Bo, MD 09/13/18 2122

## 2018-09-14 ENCOUNTER — Ambulatory Visit: Payer: Medicare Other | Attending: Internal Medicine | Admitting: Internal Medicine

## 2018-09-14 ENCOUNTER — Encounter: Payer: Self-pay | Admitting: Internal Medicine

## 2018-09-14 VITALS — BP 90/61 | HR 60 | Temp 97.4°F | Resp 16 | Wt 159.6 lb

## 2018-09-14 DIAGNOSIS — D649 Anemia, unspecified: Secondary | ICD-10-CM | POA: Diagnosis not present

## 2018-09-14 DIAGNOSIS — Z79899 Other long term (current) drug therapy: Secondary | ICD-10-CM | POA: Insufficient documentation

## 2018-09-14 DIAGNOSIS — K219 Gastro-esophageal reflux disease without esophagitis: Secondary | ICD-10-CM | POA: Diagnosis not present

## 2018-09-14 DIAGNOSIS — Z992 Dependence on renal dialysis: Secondary | ICD-10-CM | POA: Diagnosis not present

## 2018-09-14 DIAGNOSIS — I420 Dilated cardiomyopathy: Secondary | ICD-10-CM | POA: Insufficient documentation

## 2018-09-14 DIAGNOSIS — J181 Lobar pneumonia, unspecified organism: Secondary | ICD-10-CM | POA: Insufficient documentation

## 2018-09-14 DIAGNOSIS — E1122 Type 2 diabetes mellitus with diabetic chronic kidney disease: Secondary | ICD-10-CM | POA: Insufficient documentation

## 2018-09-14 DIAGNOSIS — Z8674 Personal history of sudden cardiac arrest: Secondary | ICD-10-CM | POA: Insufficient documentation

## 2018-09-14 DIAGNOSIS — Z86010 Personal history of colonic polyps: Secondary | ICD-10-CM | POA: Diagnosis not present

## 2018-09-14 DIAGNOSIS — R531 Weakness: Secondary | ICD-10-CM | POA: Diagnosis not present

## 2018-09-14 DIAGNOSIS — R29898 Other symptoms and signs involving the musculoskeletal system: Secondary | ICD-10-CM

## 2018-09-14 DIAGNOSIS — I272 Pulmonary hypertension, unspecified: Secondary | ICD-10-CM | POA: Diagnosis not present

## 2018-09-14 DIAGNOSIS — N186 End stage renal disease: Secondary | ICD-10-CM | POA: Diagnosis not present

## 2018-09-14 DIAGNOSIS — Z87891 Personal history of nicotine dependence: Secondary | ICD-10-CM | POA: Insufficient documentation

## 2018-09-14 DIAGNOSIS — J90 Pleural effusion, not elsewhere classified: Secondary | ICD-10-CM | POA: Diagnosis not present

## 2018-09-14 DIAGNOSIS — I509 Heart failure, unspecified: Secondary | ICD-10-CM | POA: Diagnosis present

## 2018-09-14 DIAGNOSIS — Z7901 Long term (current) use of anticoagulants: Secondary | ICD-10-CM | POA: Diagnosis not present

## 2018-09-14 DIAGNOSIS — Z888 Allergy status to other drugs, medicaments and biological substances status: Secondary | ICD-10-CM | POA: Diagnosis not present

## 2018-09-14 DIAGNOSIS — I5082 Biventricular heart failure: Secondary | ICD-10-CM | POA: Diagnosis not present

## 2018-09-14 DIAGNOSIS — R8290 Unspecified abnormal findings in urine: Secondary | ICD-10-CM | POA: Diagnosis not present

## 2018-09-14 DIAGNOSIS — K146 Glossodynia: Secondary | ICD-10-CM | POA: Insufficient documentation

## 2018-09-14 DIAGNOSIS — K7460 Unspecified cirrhosis of liver: Secondary | ICD-10-CM | POA: Insufficient documentation

## 2018-09-14 DIAGNOSIS — I4891 Unspecified atrial fibrillation: Secondary | ICD-10-CM | POA: Insufficient documentation

## 2018-09-14 DIAGNOSIS — J189 Pneumonia, unspecified organism: Secondary | ICD-10-CM

## 2018-09-14 DIAGNOSIS — E785 Hyperlipidemia, unspecified: Secondary | ICD-10-CM | POA: Diagnosis not present

## 2018-09-14 DIAGNOSIS — I2510 Atherosclerotic heart disease of native coronary artery without angina pectoris: Secondary | ICD-10-CM | POA: Diagnosis not present

## 2018-09-14 DIAGNOSIS — Z23 Encounter for immunization: Secondary | ICD-10-CM

## 2018-09-14 DIAGNOSIS — G4700 Insomnia, unspecified: Secondary | ICD-10-CM | POA: Insufficient documentation

## 2018-09-14 DIAGNOSIS — I132 Hypertensive heart and chronic kidney disease with heart failure and with stage 5 chronic kidney disease, or end stage renal disease: Secondary | ICD-10-CM | POA: Diagnosis not present

## 2018-09-14 DIAGNOSIS — Z7951 Long term (current) use of inhaled steroids: Secondary | ICD-10-CM | POA: Insufficient documentation

## 2018-09-14 MED ORDER — LIDOCAINE VISCOUS HCL 2 % MT SOLN: OROMUCOSAL | 0 refills | Status: DC

## 2018-09-14 NOTE — Patient Instructions (Signed)

## 2018-09-14 NOTE — Progress Notes (Signed)
Pt states he is having pain on his tongue  Pt is needing a referral for home health

## 2018-09-14 NOTE — Progress Notes (Signed)
Patient ID: Michael [name], male    DOB: May 27, 1961  MRN: [id number]  CC: Hospitalization Follow-up and Referral   Subjective: Michael [name] is a 57 y.o. male who presents for HF.  Interpreter from [employer] [place], is with him and interprets.  His concerns today include:  Patient with history of ESRD on HD, CAD, PAFon coumadin, diabetes, HTN, HL, CHF EF 55-60%, MVR, cirrhosis  Patient hospitalized 9/20-26/2019 with septic shock due to right lower lobe pneumonia.  He was treated appropriately with antibiotics.  He had some issues with persistently low blood pressures and patient was placed on Midodrine.  He also had an abnormal UA with red blood cells and protein in the urine.  When asked whether he has noticed any blood in the urine patient states that he cannot remember.  Since discharge, patient reports no fever, cough or SOB.  He was seen in the emergency room yesterday pulse dialysis for hypotension.  Chest x-ray revealed persistent moderate right pleural effusion.  ESRD:  M/W/F/Sat -lives with a roommate -wants PCS services.  Reports just feeling overall weak.  He needs help with putting on his close and with meal preps.  He has a shower bench and can get in and out of the shower himself.  He also toilets independently.  He ambulates with a cane.  He denies any falls.  Complains of some discomfort in the left lower leg from the knee to the ankle since yesterday.  He is concerned that he may have a flare of gout.  Other complaint today is soreness on the right side of the tongue.  He reportedly bit his tongue about a week ago and it has been sore ever since.   Patient Active Problem List   Diagnosis Date Noted  . Septic shock ([place])   . Acute encephalopathy 08/06/2018  . Pleural effusion   . SIRS (systemic inflammatory response syndrome) ([place]) 12/01/2017  . ESRD (end stage renal disease) ([place]) 12/01/2017  . CAD (coronary artery disease) 11/24/2017  . Hyperkalemia  11/24/2017  . Fluid overload 11/24/2017  . Elevated troponin 11/24/2017  . Dyspnea 09/18/2017  . Chronic renal failure   . Advance care planning   . Pulmonary hypertension, unspecified ([place])   . Chest pain   . Biventricular heart failure ([place]) 08/10/2017  . Syncope and collapse 08/10/2017  . Cardiac arrest ([place])   . Benign neoplasm of ascending colon   . Rectal bleeding   . ESRD (end stage renal disease) on dialysis ([place])   . Goals of care, counseling/discussion   . Palliative care by specialist   . Acute kidney injury ([place]) 06/25/2017  . Anasarca 05/30/2017  . HLD (hyperlipidemia) 10/05/2016  . Mouth bleeding 10/05/2016  . Bilateral knee pain 10/05/2016  . Petechial rash 10/05/2016  . Laceration of tongue   . Insomnia 06/10/2016  . Supratherapeutic INR 06/01/2016  . Acute hepatic encephalopathy 03/15/2016  . Liver cirrhosis ([place]) 03/15/2016  . Volume overload 03/15/2016  . Acute congestive heart failure ([place])   . Insulin dependent diabetes mellitus ([place])   . Acute renal failure (ARF) ([place]) 03/14/2016  . GERD (gastroesophageal reflux disease) 03/04/2016  . Aortic regurgitation 02/27/2016  . Viral gastroenteritis 02/27/2016  . A-fib ([place]) 02/26/2016  . Abdominal pain 02/25/2016  . Diabetes mellitus with complication ([place]) 15/03/6978  . Permanent atrial fibrillation 02/25/2016  . Congestive heart failure (CHF) ([place]) 02/25/2016  . Anemia, chronic disease 02/25/2016  . Atrial fibrillation with RVR ([place]) 02/19/2016  . Pain  in joint, ankle and foot 01/10/2016  . Asthma   . Acute hypoxemic respiratory failure ([place]) 12/29/2015  . Acute respiratory failure ([place]) 12/29/2015  . Chronic atrial fibrillation 12/24/2015  . Dilated cardiomyopathy ([place]) 12/24/2015  . Polypharmacy 11/23/2015  . Gout 10/23/2015  . Encounter for therapeutic drug monitoring 10/19/2015  . Anemia - mild exacerbation 10/15/2015  . Tricuspid valve disease 10/09/2015  . Mitral valve disease   . Calf pain  09/10/2015  . Type II diabetes mellitus with renal manifestations ([place]) 09/10/2015  . Essential hypertension 09/10/2015     Current Outpatient Medications on File Prior to Visit  Medication Sig Dispense Refill  . acetaminophen (TYLENOL) 500 MG tablet Take 1,000 mg by mouth daily as needed for headache (pain).     Marland Kitchen amiodarone (PACERONE) 200 MG tablet TAKE 1 TABLET BY MOUTH TWICE A DAY 60 tablet 10  . atorvastatin (LIPITOR) 20 MG tablet TAKE 1 TABLET (20 MG TOTAL) BY MOUTH DAILY. PLEASE KEEP UPCOMING APPT. THANK YOU 90 tablet 1  . calcium acetate (PHOSLO) 667 MG capsule Take 1 capsule (667 mg total) by mouth 3 (three) times daily with meals. Please keep upcoming appt. Thank You 90 capsule 0  . colchicine 0.6 MG tablet Take 0.5 tablets (0.3 mg total) by mouth 2 (two) times a week. 30 tablet 0  . diazepam (VALIUM) 5 MG tablet Take 5 mg by mouth at bedtime as needed (sleep).    . diphenhydramine-acetaminophen (TYLENOL PM) 25-500 MG TABS tablet Take 1 tablet by mouth at bedtime as needed (pain).    . fluticasone (FLONASE) 50 MCG/ACT nasal spray Place 2 sprays into both nostrils daily. 16 g 0  . folic acid (FOLVITE) 1 MG tablet TAKE 1 TABLET BY MOUTH EVERY DAY 90 tablet 0  . gabapentin (NEURONTIN) 100 MG capsule Take 200 mg by mouth at bedtime. FOR LEG PAIN    . metoprolol tartrate (LOPRESSOR) 25 MG tablet Take 1 tablet (25 mg total) by mouth 2 (two) times daily. 180 tablet 3  . multivitamin (RENA-VIT) TABS tablet Take 1 tablet by mouth at bedtime. 30 tablet 0  . OXYGEN Inhale 2 L into the lungs daily as needed (shortness of breath).     Marland Kitchen tiZANidine (ZANAFLEX) 4 MG tablet Take 1 tablet (4 mg total) by mouth every 8 (eight) hours as needed for muscle spasms. 60 tablet 1  . warfarin (COUMADIN) 2.5 MG tablet TAKE  AS DIRECTED BY COUMADIN CLINIC 40 tablet 1   No current facility-administered medications on file prior to visit.     Allergies  Allergen Reactions  . Triamcinolone Other (See Comments)     Felt like skin was burning    Social History   Socioeconomic History  . Marital status: Single    Spouse name: Not on file  . Number of children: Not on file  . Years of education: Not on file  . Highest education level: Not on file  Occupational History    Comment: on disability  Social Needs  . Financial resource strain: Not on file  . Food insecurity:    Worry: Not on file    Inability: Not on file  . Transportation needs:    Medical: Not on file    Non-medical: Not on file  Tobacco Use  . Smoking status: Former Smoker    Packs/day: 1.00    Types: Cigarettes  . Smokeless tobacco: Never Used  Substance and Sexual Activity  . Alcohol use: No  . Drug use: No  .  Sexual activity: Not Currently  Lifestyle  . Physical activity:    Days per week: Not on file    Minutes per session: Not on file  . Stress: Not on file  Relationships  . Social connections:    Talks on phone: Not on file    Gets together: Not on file    Attends religious service: Not on file    Active member of club or organization: Not on file    Attends meetings of clubs or organizations: Not on file    Relationship status: Not on file  . Intimate partner violence:    Fear of current or ex partner: Not on file    Emotionally abused: Not on file    Physically abused: Not on file    Forced sexual activity: Not on file  Other Topics Concern  . Not on file  Social History Narrative  . Not on file    Family History  Problem Relation Age of Onset  . Heart attack Neg Hx     Past Surgical History:  Procedure Laterality Date  . AV FISTULA PLACEMENT Left 07/13/2017   Procedure: LEFT ARM ARTERIOVENOUS (AV) FISTULA CREATION;  Surgeon: Waynetta Sandy, MD;  Location: [facility];  Service: Vascular;  Laterality: Left;  . BASCILIC VEIN TRANSPOSITION Left 11/03/2017   Procedure: BASILIC VEIN TRANSPOSITION SECOND STAGE;  Surgeon: Waynetta Sandy, MD;  Location: [facility];  Service: Vascular;   Laterality: Left;  . CARDIAC SURGERY    . CARDIAC VALVE REPLACEMENT  2011  . CARDIOVERSION N/A 07/09/2017   Procedure: CARDIOVERSION;  Surgeon: Larey Dresser, MD;  Location: [facility] ENDOSCOPY;  Service: Cardiovascular;  Laterality: N/A;  . COLONOSCOPY N/A 07/16/2017   Procedure: COLONOSCOPY;  Surgeon: Jerene Bears, MD;  Location: [facility];  Service: Gastroenterology;  Laterality: N/A;  . ESOPHAGOGASTRODUODENOSCOPY N/A 07/16/2017   Procedure: ESOPHAGOGASTRODUODENOSCOPY (EGD);  Surgeon: Jerene Bears, MD;  Location: [facility] ENDOSCOPY;  Service: Gastroenterology;  Laterality: N/A;  . INSERTION OF DIALYSIS CATHETER Right 07/13/2017   Procedure: INSERTION OF DIALYSIS CATHETER RIGHT INTERNAL JUGULAR;  Surgeon: Waynetta Sandy, MD;  Location: [facility];  Service: Vascular;  Laterality: Right;  . RIGHT HEART CATH N/A 06/29/2017   Procedure: RIGHT HEART CATH;  Surgeon: Larey Dresser, MD;  Location: [facility] CV LAB;  Service: Cardiovascular;  Laterality: N/A;  . RIGHT/LEFT HEART CATH AND CORONARY ANGIOGRAPHY N/A 08/19/2017   Procedure: RIGHT/LEFT HEART CATH AND CORONARY ANGIOGRAPHY;  Surgeon: Leonie Man, MD;  Location: [facility] CV LAB;  Service: Cardiovascular;  Laterality: N/A;  . 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  He appears to  have some difficulty with memory. Mouth -oral mucosa moist.  He has what looks like a small ulcer on the lateral aspect of the right side of the tongue.  Throat is clear.   Neck -no cervical lymphadenopathy. Chest -breath sounds decreased at the right base.  Other lung fields are clear.   Heart -irregularly irregular rhythm Musculoskeletal -ambulates with a cane.  Low foot floor clearance.  No edema or erythema noted of the left knee or ankle. Extremities -no lower extremity edema.  Results for orders placed or performed during the hospital encounter of 09/13/18  CBC with Differential  Result Value Ref Range   WBC 3.5 (L) 4.0 - 10.5 K/uL   RBC 3.63 (L) 4.22 - 5.81 MIL/uL   Hemoglobin 9.7 (L) 13.0 - 17.0 g/dL   HCT 32.6 (L) 39.0 - 52.0 %   MCV 89.8 80.0 - 100.0 fL   MCH 26.7 26.0 - 34.0 pg   MCHC 29.8 (L) 30.0 - 36.0 g/dL   RDW 16.6 (H) 11.5 - 15.5 %   Platelets 87 (L) 150 - 400 K/uL   nRBC 0.0 0.0 - 0.2 %   Neutrophils Relative % 53 %   Neutro Abs 1.9 1.7 - 7.7 K/uL   Lymphocytes Relative 25 %   Lymphs Abs 0.9 0.7 - 4.0 K/uL   Monocytes Relative 15 %   Monocytes Absolute 0.5 0.1 - 1.0 K/uL   Eosinophils Relative 6 %   Eosinophils Absolute 0.2 0.0 - 0.5 K/uL   Basophils Relative 1 %   Basophils Absolute 0.0 0.0 - 0.1 K/uL   Immature Granulocytes 0 %   Abs Immature Granulocytes 0.01 0.00 - 0.07 K/uL  I-stat Chem 8, ED  Result Value Ref Range   Sodium 141 135 - 145 mmol/L   Potassium 3.7 3.5 - 5.1 mmol/L   Chloride 100 98 - 111 mmol/L   BUN 26 (H) 6 - 20 mg/dL   Creatinine, Ser 3.60 (H) 0.61 - 1.24 mg/dL   Glucose, Bld 86 70 - 99 mg/dL   Calcium, Ion 1.12 (L) 1.15 - 1.40 mmol/L   TCO2 31 22 - 32 mmol/L   Hemoglobin 11.2 (L) 13.0 - 17.0 g/dL   HCT 33.0 (L) 39.0 - 52.0 %   Lab Results  Component Value Date   IRON 212 (H) 08/10/2018   TIBC 267 08/10/2018   FERRITIN 823 (H) 08/10/2018    ASSESSMENT AND PLAN:  1. Community acquired pneumonia of right lower lobe of lung  ([place]) Symptomatically has resolved however patient has a persistent right lower lobe infiltrate.  Will refer to pulmonary for further evaluation 2. Pleural effusion on right - Ambulatory referral to Pulmonology  3. Painful tongue Patient given some viscous lidocaine to use as needed  4. Anemia, unspecified type -likely ACD  5. ESRD on hemodialysis ([place])   6. Complaints of leg weakness I have spoken with case worker about trying to get him PCS services.  However patient has Medicare only. Exam not consistent with acute gout flare at this time  7. Urine findings abnormal Patient was unable to give urine sample today for repeat UA.  Can be done on his follow-up with PCP  8. Need for immunization against influenza - Flu Vaccine QUAD 36+ mos IM    Patient was given the opportunity to ask questions.  Patient verbalized understanding of the plan and was able to repeat key elements of the plan.   No orders of the defined types were placed in this encounter.    Requested Prescriptions    No prescriptions requested or ordered in this encounter    No follow-ups on file.  Karle Plumber, MD, FACP

## 2018-09-15 ENCOUNTER — Telehealth: Payer: Self-pay

## 2018-09-15 DIAGNOSIS — Z992 Dependence on renal dialysis: Secondary | ICD-10-CM | POA: Diagnosis not present

## 2018-09-15 DIAGNOSIS — N2581 Secondary hyperparathyroidism of renal origin: Secondary | ICD-10-CM | POA: Diagnosis not present

## 2018-09-15 DIAGNOSIS — D509 Iron deficiency anemia, unspecified: Secondary | ICD-10-CM | POA: Diagnosis not present

## 2018-09-15 DIAGNOSIS — D631 Anemia in chronic kidney disease: Secondary | ICD-10-CM | POA: Diagnosis not present

## 2018-09-15 DIAGNOSIS — N186 End stage renal disease: Secondary | ICD-10-CM | POA: Diagnosis not present

## 2018-09-15 DIAGNOSIS — E876 Hypokalemia: Secondary | ICD-10-CM | POA: Diagnosis not present

## 2018-09-15 NOTE — Telephone Encounter (Signed)
Attempted to contact the patient to discuss home health services. He is not eligible for PS because he does not have medicaid. Calls placed to # [phone] (H)  And the voicemail was full. Voicemail message left on #  [phone] (W) requesting call back to this CM # [phone].

## 2018-09-17 DIAGNOSIS — E119 Type 2 diabetes mellitus without complications: Secondary | ICD-10-CM | POA: Diagnosis not present

## 2018-09-17 DIAGNOSIS — D631 Anemia in chronic kidney disease: Secondary | ICD-10-CM | POA: Diagnosis not present

## 2018-09-17 DIAGNOSIS — E876 Hypokalemia: Secondary | ICD-10-CM | POA: Diagnosis not present

## 2018-09-17 DIAGNOSIS — I509 Heart failure, unspecified: Secondary | ICD-10-CM | POA: Diagnosis not present

## 2018-09-17 DIAGNOSIS — I50810 Right heart failure, unspecified: Secondary | ICD-10-CM | POA: Diagnosis not present

## 2018-09-17 DIAGNOSIS — D509 Iron deficiency anemia, unspecified: Secondary | ICD-10-CM | POA: Diagnosis not present

## 2018-09-17 DIAGNOSIS — I5022 Chronic systolic (congestive) heart failure: Secondary | ICD-10-CM | POA: Diagnosis not present

## 2018-09-17 DIAGNOSIS — I132 Hypertensive heart and chronic kidney disease with heart failure and with stage 5 chronic kidney disease, or end stage renal disease: Secondary | ICD-10-CM | POA: Diagnosis not present

## 2018-09-17 DIAGNOSIS — Z992 Dependence on renal dialysis: Secondary | ICD-10-CM | POA: Diagnosis not present

## 2018-09-17 DIAGNOSIS — N186 End stage renal disease: Secondary | ICD-10-CM | POA: Diagnosis not present

## 2018-09-17 DIAGNOSIS — N2581 Secondary hyperparathyroidism of renal origin: Secondary | ICD-10-CM | POA: Diagnosis not present

## 2018-09-18 DIAGNOSIS — N186 End stage renal disease: Secondary | ICD-10-CM | POA: Diagnosis not present

## 2018-09-18 DIAGNOSIS — Z992 Dependence on renal dialysis: Secondary | ICD-10-CM | POA: Diagnosis not present

## 2018-09-18 DIAGNOSIS — N2581 Secondary hyperparathyroidism of renal origin: Secondary | ICD-10-CM | POA: Diagnosis not present

## 2018-09-18 DIAGNOSIS — D509 Iron deficiency anemia, unspecified: Secondary | ICD-10-CM | POA: Diagnosis not present

## 2018-09-18 DIAGNOSIS — E119 Type 2 diabetes mellitus without complications: Secondary | ICD-10-CM | POA: Diagnosis not present

## 2018-09-18 DIAGNOSIS — E876 Hypokalemia: Secondary | ICD-10-CM | POA: Diagnosis not present

## 2018-09-20 ENCOUNTER — Ambulatory Visit: Payer: Medicare Other | Admitting: Internal Medicine

## 2018-09-20 DIAGNOSIS — E119 Type 2 diabetes mellitus without complications: Secondary | ICD-10-CM | POA: Diagnosis not present

## 2018-09-20 DIAGNOSIS — N2581 Secondary hyperparathyroidism of renal origin: Secondary | ICD-10-CM | POA: Diagnosis not present

## 2018-09-20 DIAGNOSIS — Z992 Dependence on renal dialysis: Secondary | ICD-10-CM | POA: Diagnosis not present

## 2018-09-20 DIAGNOSIS — N186 End stage renal disease: Secondary | ICD-10-CM | POA: Diagnosis not present

## 2018-09-20 DIAGNOSIS — I482 Chronic atrial fibrillation, unspecified: Secondary | ICD-10-CM | POA: Diagnosis not present

## 2018-09-20 DIAGNOSIS — E876 Hypokalemia: Secondary | ICD-10-CM | POA: Diagnosis not present

## 2018-09-20 DIAGNOSIS — D509 Iron deficiency anemia, unspecified: Secondary | ICD-10-CM | POA: Diagnosis not present

## 2018-09-20 LAB — PROTIME-INR: INR: 4.2 — AB (ref 0.9–1.1)

## 2018-09-21 ENCOUNTER — Ambulatory Visit (INDEPENDENT_AMBULATORY_CARE_PROVIDER_SITE_OTHER): Payer: Medicare Other | Admitting: Cardiology

## 2018-09-21 DIAGNOSIS — I079 Rheumatic tricuspid valve disease, unspecified: Secondary | ICD-10-CM | POA: Diagnosis not present

## 2018-09-21 DIAGNOSIS — Z5181 Encounter for therapeutic drug level monitoring: Secondary | ICD-10-CM | POA: Diagnosis not present

## 2018-09-21 DIAGNOSIS — I059 Rheumatic mitral valve disease, unspecified: Secondary | ICD-10-CM | POA: Diagnosis not present

## 2018-09-22 ENCOUNTER — Telehealth: Payer: Self-pay

## 2018-09-22 DIAGNOSIS — E119 Type 2 diabetes mellitus without complications: Secondary | ICD-10-CM | POA: Diagnosis not present

## 2018-09-22 DIAGNOSIS — N186 End stage renal disease: Secondary | ICD-10-CM | POA: Diagnosis not present

## 2018-09-22 DIAGNOSIS — D509 Iron deficiency anemia, unspecified: Secondary | ICD-10-CM | POA: Diagnosis not present

## 2018-09-22 DIAGNOSIS — N2581 Secondary hyperparathyroidism of renal origin: Secondary | ICD-10-CM | POA: Diagnosis not present

## 2018-09-22 DIAGNOSIS — Z992 Dependence on renal dialysis: Secondary | ICD-10-CM | POA: Diagnosis not present

## 2018-09-22 DIAGNOSIS — E876 Hypokalemia: Secondary | ICD-10-CM | POA: Diagnosis not present

## 2018-09-22 NOTE — Telephone Encounter (Signed)
Attempted to contact the patient to discuss home health services. He is not eligible for PCS because he does not have medicaid. Calls placed to # [phone] (H)  And the voicemail was full. Voicemail message left on #  [phone] (W) requesting call back to this CM # [phone].

## 2018-09-24 DIAGNOSIS — N186 End stage renal disease: Secondary | ICD-10-CM | POA: Diagnosis not present

## 2018-09-24 DIAGNOSIS — N2581 Secondary hyperparathyroidism of renal origin: Secondary | ICD-10-CM | POA: Diagnosis not present

## 2018-09-24 DIAGNOSIS — E119 Type 2 diabetes mellitus without complications: Secondary | ICD-10-CM | POA: Diagnosis not present

## 2018-09-24 DIAGNOSIS — E876 Hypokalemia: Secondary | ICD-10-CM | POA: Diagnosis not present

## 2018-09-24 DIAGNOSIS — D509 Iron deficiency anemia, unspecified: Secondary | ICD-10-CM | POA: Diagnosis not present

## 2018-09-24 DIAGNOSIS — Z992 Dependence on renal dialysis: Secondary | ICD-10-CM | POA: Diagnosis not present

## 2018-09-25 DIAGNOSIS — N2581 Secondary hyperparathyroidism of renal origin: Secondary | ICD-10-CM | POA: Diagnosis not present

## 2018-09-25 DIAGNOSIS — E876 Hypokalemia: Secondary | ICD-10-CM | POA: Diagnosis not present

## 2018-09-25 DIAGNOSIS — D509 Iron deficiency anemia, unspecified: Secondary | ICD-10-CM | POA: Diagnosis not present

## 2018-09-25 DIAGNOSIS — E119 Type 2 diabetes mellitus without complications: Secondary | ICD-10-CM | POA: Diagnosis not present

## 2018-09-25 DIAGNOSIS — N186 End stage renal disease: Secondary | ICD-10-CM | POA: Diagnosis not present

## 2018-09-25 DIAGNOSIS — Z992 Dependence on renal dialysis: Secondary | ICD-10-CM | POA: Diagnosis not present

## 2018-09-27 DIAGNOSIS — E119 Type 2 diabetes mellitus without complications: Secondary | ICD-10-CM | POA: Diagnosis not present

## 2018-09-27 DIAGNOSIS — N2581 Secondary hyperparathyroidism of renal origin: Secondary | ICD-10-CM | POA: Diagnosis not present

## 2018-09-27 DIAGNOSIS — D509 Iron deficiency anemia, unspecified: Secondary | ICD-10-CM | POA: Diagnosis not present

## 2018-09-27 DIAGNOSIS — N186 End stage renal disease: Secondary | ICD-10-CM | POA: Diagnosis not present

## 2018-09-27 DIAGNOSIS — Z992 Dependence on renal dialysis: Secondary | ICD-10-CM | POA: Diagnosis not present

## 2018-09-27 DIAGNOSIS — I482 Chronic atrial fibrillation, unspecified: Secondary | ICD-10-CM | POA: Diagnosis not present

## 2018-09-27 DIAGNOSIS — E876 Hypokalemia: Secondary | ICD-10-CM | POA: Diagnosis not present

## 2018-09-27 LAB — PROTIME-INR: INR: 4.9 — AB (ref <=1.1)

## 2018-09-28 ENCOUNTER — Ambulatory Visit (INDEPENDENT_AMBULATORY_CARE_PROVIDER_SITE_OTHER): Payer: Medicare Other | Admitting: Interventional Cardiology

## 2018-09-28 DIAGNOSIS — I079 Rheumatic tricuspid valve disease, unspecified: Secondary | ICD-10-CM

## 2018-09-28 DIAGNOSIS — I059 Rheumatic mitral valve disease, unspecified: Secondary | ICD-10-CM

## 2018-09-28 DIAGNOSIS — Z5181 Encounter for therapeutic drug level monitoring: Secondary | ICD-10-CM

## 2018-09-29 DIAGNOSIS — D509 Iron deficiency anemia, unspecified: Secondary | ICD-10-CM | POA: Diagnosis not present

## 2018-09-29 DIAGNOSIS — E876 Hypokalemia: Secondary | ICD-10-CM | POA: Diagnosis not present

## 2018-09-29 DIAGNOSIS — N186 End stage renal disease: Secondary | ICD-10-CM | POA: Diagnosis not present

## 2018-09-29 DIAGNOSIS — N2581 Secondary hyperparathyroidism of renal origin: Secondary | ICD-10-CM | POA: Diagnosis not present

## 2018-09-29 DIAGNOSIS — Z992 Dependence on renal dialysis: Secondary | ICD-10-CM | POA: Diagnosis not present

## 2018-09-29 DIAGNOSIS — E119 Type 2 diabetes mellitus without complications: Secondary | ICD-10-CM | POA: Diagnosis not present

## 2018-10-01 ENCOUNTER — Telehealth: Payer: Self-pay

## 2018-10-01 DIAGNOSIS — N186 End stage renal disease: Secondary | ICD-10-CM | POA: Diagnosis not present

## 2018-10-01 DIAGNOSIS — E876 Hypokalemia: Secondary | ICD-10-CM | POA: Diagnosis not present

## 2018-10-01 DIAGNOSIS — N2581 Secondary hyperparathyroidism of renal origin: Secondary | ICD-10-CM | POA: Diagnosis not present

## 2018-10-01 DIAGNOSIS — E119 Type 2 diabetes mellitus without complications: Secondary | ICD-10-CM | POA: Diagnosis not present

## 2018-10-01 DIAGNOSIS — D509 Iron deficiency anemia, unspecified: Secondary | ICD-10-CM | POA: Diagnosis not present

## 2018-10-01 DIAGNOSIS — Z992 Dependence on renal dialysis: Secondary | ICD-10-CM | POA: Diagnosis not present

## 2018-10-01 NOTE — Telephone Encounter (Signed)
Attempted again to contact the patient to discuss home health care options as he is not eligible for PCS without medicaid. Calls placed to  # [phone] (H) and the voicemail was full. # [phone] (M) and # [phone] (W) and messages were left on both numbers requesting a call back to this CM # [phone]

## 2018-10-02 DIAGNOSIS — N186 End stage renal disease: Secondary | ICD-10-CM | POA: Diagnosis not present

## 2018-10-02 DIAGNOSIS — D509 Iron deficiency anemia, unspecified: Secondary | ICD-10-CM | POA: Diagnosis not present

## 2018-10-02 DIAGNOSIS — E876 Hypokalemia: Secondary | ICD-10-CM | POA: Diagnosis not present

## 2018-10-02 DIAGNOSIS — N2581 Secondary hyperparathyroidism of renal origin: Secondary | ICD-10-CM | POA: Diagnosis not present

## 2018-10-02 DIAGNOSIS — Z992 Dependence on renal dialysis: Secondary | ICD-10-CM | POA: Diagnosis not present

## 2018-10-02 DIAGNOSIS — E119 Type 2 diabetes mellitus without complications: Secondary | ICD-10-CM | POA: Diagnosis not present

## 2018-10-04 DIAGNOSIS — E119 Type 2 diabetes mellitus without complications: Secondary | ICD-10-CM | POA: Diagnosis not present

## 2018-10-04 DIAGNOSIS — Z992 Dependence on renal dialysis: Secondary | ICD-10-CM | POA: Diagnosis not present

## 2018-10-04 DIAGNOSIS — N186 End stage renal disease: Secondary | ICD-10-CM | POA: Diagnosis not present

## 2018-10-04 DIAGNOSIS — E876 Hypokalemia: Secondary | ICD-10-CM | POA: Diagnosis not present

## 2018-10-04 DIAGNOSIS — N2581 Secondary hyperparathyroidism of renal origin: Secondary | ICD-10-CM | POA: Diagnosis not present

## 2018-10-04 DIAGNOSIS — D509 Iron deficiency anemia, unspecified: Secondary | ICD-10-CM | POA: Diagnosis not present

## 2018-10-04 DIAGNOSIS — I482 Chronic atrial fibrillation, unspecified: Secondary | ICD-10-CM | POA: Diagnosis not present

## 2018-10-06 DIAGNOSIS — E876 Hypokalemia: Secondary | ICD-10-CM | POA: Diagnosis not present

## 2018-10-06 DIAGNOSIS — N2581 Secondary hyperparathyroidism of renal origin: Secondary | ICD-10-CM | POA: Diagnosis not present

## 2018-10-06 DIAGNOSIS — Z992 Dependence on renal dialysis: Secondary | ICD-10-CM | POA: Diagnosis not present

## 2018-10-06 DIAGNOSIS — N186 End stage renal disease: Secondary | ICD-10-CM | POA: Diagnosis not present

## 2018-10-06 DIAGNOSIS — E119 Type 2 diabetes mellitus without complications: Secondary | ICD-10-CM | POA: Diagnosis not present

## 2018-10-06 DIAGNOSIS — D509 Iron deficiency anemia, unspecified: Secondary | ICD-10-CM | POA: Diagnosis not present

## 2018-10-08 DIAGNOSIS — D509 Iron deficiency anemia, unspecified: Secondary | ICD-10-CM | POA: Diagnosis not present

## 2018-10-08 DIAGNOSIS — E119 Type 2 diabetes mellitus without complications: Secondary | ICD-10-CM | POA: Diagnosis not present

## 2018-10-08 DIAGNOSIS — E876 Hypokalemia: Secondary | ICD-10-CM | POA: Diagnosis not present

## 2018-10-08 DIAGNOSIS — N186 End stage renal disease: Secondary | ICD-10-CM | POA: Diagnosis not present

## 2018-10-08 DIAGNOSIS — Z992 Dependence on renal dialysis: Secondary | ICD-10-CM | POA: Diagnosis not present

## 2018-10-08 DIAGNOSIS — N2581 Secondary hyperparathyroidism of renal origin: Secondary | ICD-10-CM | POA: Diagnosis not present

## 2018-10-09 DIAGNOSIS — D509 Iron deficiency anemia, unspecified: Secondary | ICD-10-CM | POA: Diagnosis not present

## 2018-10-09 DIAGNOSIS — N186 End stage renal disease: Secondary | ICD-10-CM | POA: Diagnosis not present

## 2018-10-09 DIAGNOSIS — E876 Hypokalemia: Secondary | ICD-10-CM | POA: Diagnosis not present

## 2018-10-09 DIAGNOSIS — Z992 Dependence on renal dialysis: Secondary | ICD-10-CM | POA: Diagnosis not present

## 2018-10-09 DIAGNOSIS — N2581 Secondary hyperparathyroidism of renal origin: Secondary | ICD-10-CM | POA: Diagnosis not present

## 2018-10-09 DIAGNOSIS — E119 Type 2 diabetes mellitus without complications: Secondary | ICD-10-CM | POA: Diagnosis not present

## 2018-10-10 DIAGNOSIS — N2581 Secondary hyperparathyroidism of renal origin: Secondary | ICD-10-CM | POA: Diagnosis not present

## 2018-10-10 DIAGNOSIS — D509 Iron deficiency anemia, unspecified: Secondary | ICD-10-CM | POA: Diagnosis not present

## 2018-10-10 DIAGNOSIS — E876 Hypokalemia: Secondary | ICD-10-CM | POA: Diagnosis not present

## 2018-10-10 DIAGNOSIS — E119 Type 2 diabetes mellitus without complications: Secondary | ICD-10-CM | POA: Diagnosis not present

## 2018-10-10 DIAGNOSIS — Z992 Dependence on renal dialysis: Secondary | ICD-10-CM | POA: Diagnosis not present

## 2018-10-10 DIAGNOSIS — N186 End stage renal disease: Secondary | ICD-10-CM | POA: Diagnosis not present

## 2018-10-10 DIAGNOSIS — I482 Chronic atrial fibrillation, unspecified: Secondary | ICD-10-CM | POA: Diagnosis not present

## 2018-10-12 DIAGNOSIS — N2581 Secondary hyperparathyroidism of renal origin: Secondary | ICD-10-CM | POA: Diagnosis not present

## 2018-10-12 DIAGNOSIS — D509 Iron deficiency anemia, unspecified: Secondary | ICD-10-CM | POA: Diagnosis not present

## 2018-10-12 DIAGNOSIS — E876 Hypokalemia: Secondary | ICD-10-CM | POA: Diagnosis not present

## 2018-10-12 DIAGNOSIS — Z992 Dependence on renal dialysis: Secondary | ICD-10-CM | POA: Diagnosis not present

## 2018-10-12 DIAGNOSIS — N186 End stage renal disease: Secondary | ICD-10-CM | POA: Diagnosis not present

## 2018-10-12 DIAGNOSIS — E119 Type 2 diabetes mellitus without complications: Secondary | ICD-10-CM | POA: Diagnosis not present

## 2018-10-13 DIAGNOSIS — E119 Type 2 diabetes mellitus without complications: Secondary | ICD-10-CM | POA: Diagnosis not present

## 2018-10-13 DIAGNOSIS — N186 End stage renal disease: Secondary | ICD-10-CM | POA: Diagnosis not present

## 2018-10-13 DIAGNOSIS — N2581 Secondary hyperparathyroidism of renal origin: Secondary | ICD-10-CM | POA: Diagnosis not present

## 2018-10-13 DIAGNOSIS — D509 Iron deficiency anemia, unspecified: Secondary | ICD-10-CM | POA: Diagnosis not present

## 2018-10-13 DIAGNOSIS — E876 Hypokalemia: Secondary | ICD-10-CM | POA: Diagnosis not present

## 2018-10-13 DIAGNOSIS — Z992 Dependence on renal dialysis: Secondary | ICD-10-CM | POA: Diagnosis not present

## 2018-10-15 DIAGNOSIS — Z992 Dependence on renal dialysis: Secondary | ICD-10-CM | POA: Diagnosis not present

## 2018-10-15 DIAGNOSIS — E119 Type 2 diabetes mellitus without complications: Secondary | ICD-10-CM | POA: Diagnosis not present

## 2018-10-15 DIAGNOSIS — E876 Hypokalemia: Secondary | ICD-10-CM | POA: Diagnosis not present

## 2018-10-15 DIAGNOSIS — D509 Iron deficiency anemia, unspecified: Secondary | ICD-10-CM | POA: Diagnosis not present

## 2018-10-15 DIAGNOSIS — N2581 Secondary hyperparathyroidism of renal origin: Secondary | ICD-10-CM | POA: Diagnosis not present

## 2018-10-15 DIAGNOSIS — N186 End stage renal disease: Secondary | ICD-10-CM | POA: Diagnosis not present

## 2018-10-16 DIAGNOSIS — D509 Iron deficiency anemia, unspecified: Secondary | ICD-10-CM | POA: Diagnosis not present

## 2018-10-16 DIAGNOSIS — N2581 Secondary hyperparathyroidism of renal origin: Secondary | ICD-10-CM | POA: Diagnosis not present

## 2018-10-16 DIAGNOSIS — E876 Hypokalemia: Secondary | ICD-10-CM | POA: Diagnosis not present

## 2018-10-16 DIAGNOSIS — E119 Type 2 diabetes mellitus without complications: Secondary | ICD-10-CM | POA: Diagnosis not present

## 2018-10-16 DIAGNOSIS — Z992 Dependence on renal dialysis: Secondary | ICD-10-CM | POA: Diagnosis not present

## 2018-10-16 DIAGNOSIS — N186 End stage renal disease: Secondary | ICD-10-CM | POA: Diagnosis not present

## 2018-10-17 DIAGNOSIS — N186 End stage renal disease: Secondary | ICD-10-CM | POA: Diagnosis not present

## 2018-10-17 DIAGNOSIS — I509 Heart failure, unspecified: Secondary | ICD-10-CM | POA: Diagnosis not present

## 2018-10-17 DIAGNOSIS — Z992 Dependence on renal dialysis: Secondary | ICD-10-CM | POA: Diagnosis not present

## 2018-10-18 DIAGNOSIS — N2581 Secondary hyperparathyroidism of renal origin: Secondary | ICD-10-CM | POA: Diagnosis not present

## 2018-10-18 DIAGNOSIS — E876 Hypokalemia: Secondary | ICD-10-CM | POA: Diagnosis not present

## 2018-10-18 DIAGNOSIS — I482 Chronic atrial fibrillation, unspecified: Secondary | ICD-10-CM | POA: Diagnosis not present

## 2018-10-18 DIAGNOSIS — D631 Anemia in chronic kidney disease: Secondary | ICD-10-CM | POA: Diagnosis not present

## 2018-10-18 DIAGNOSIS — I5022 Chronic systolic (congestive) heart failure: Secondary | ICD-10-CM | POA: Diagnosis not present

## 2018-10-18 DIAGNOSIS — I132 Hypertensive heart and chronic kidney disease with heart failure and with stage 5 chronic kidney disease, or end stage renal disease: Secondary | ICD-10-CM | POA: Diagnosis not present

## 2018-10-18 DIAGNOSIS — I50810 Right heart failure, unspecified: Secondary | ICD-10-CM | POA: Diagnosis not present

## 2018-10-18 DIAGNOSIS — D509 Iron deficiency anemia, unspecified: Secondary | ICD-10-CM | POA: Diagnosis not present

## 2018-10-18 DIAGNOSIS — Z992 Dependence on renal dialysis: Secondary | ICD-10-CM | POA: Diagnosis not present

## 2018-10-18 DIAGNOSIS — E119 Type 2 diabetes mellitus without complications: Secondary | ICD-10-CM | POA: Diagnosis not present

## 2018-10-18 DIAGNOSIS — N186 End stage renal disease: Secondary | ICD-10-CM | POA: Diagnosis not present

## 2018-10-20 DIAGNOSIS — N186 End stage renal disease: Secondary | ICD-10-CM | POA: Diagnosis not present

## 2018-10-20 DIAGNOSIS — N2581 Secondary hyperparathyroidism of renal origin: Secondary | ICD-10-CM | POA: Diagnosis not present

## 2018-10-20 DIAGNOSIS — E119 Type 2 diabetes mellitus without complications: Secondary | ICD-10-CM | POA: Diagnosis not present

## 2018-10-20 DIAGNOSIS — D509 Iron deficiency anemia, unspecified: Secondary | ICD-10-CM | POA: Diagnosis not present

## 2018-10-20 DIAGNOSIS — D631 Anemia in chronic kidney disease: Secondary | ICD-10-CM | POA: Diagnosis not present

## 2018-10-20 DIAGNOSIS — Z992 Dependence on renal dialysis: Secondary | ICD-10-CM | POA: Diagnosis not present

## 2018-10-22 DIAGNOSIS — N2581 Secondary hyperparathyroidism of renal origin: Secondary | ICD-10-CM | POA: Diagnosis not present

## 2018-10-22 DIAGNOSIS — D631 Anemia in chronic kidney disease: Secondary | ICD-10-CM | POA: Diagnosis not present

## 2018-10-22 DIAGNOSIS — D509 Iron deficiency anemia, unspecified: Secondary | ICD-10-CM | POA: Diagnosis not present

## 2018-10-22 DIAGNOSIS — E119 Type 2 diabetes mellitus without complications: Secondary | ICD-10-CM | POA: Diagnosis not present

## 2018-10-22 DIAGNOSIS — N186 End stage renal disease: Secondary | ICD-10-CM | POA: Diagnosis not present

## 2018-10-22 DIAGNOSIS — Z992 Dependence on renal dialysis: Secondary | ICD-10-CM | POA: Diagnosis not present

## 2018-10-23 DIAGNOSIS — Z992 Dependence on renal dialysis: Secondary | ICD-10-CM | POA: Diagnosis not present

## 2018-10-23 DIAGNOSIS — D509 Iron deficiency anemia, unspecified: Secondary | ICD-10-CM | POA: Diagnosis not present

## 2018-10-23 DIAGNOSIS — E119 Type 2 diabetes mellitus without complications: Secondary | ICD-10-CM | POA: Diagnosis not present

## 2018-10-23 DIAGNOSIS — N186 End stage renal disease: Secondary | ICD-10-CM | POA: Diagnosis not present

## 2018-10-23 DIAGNOSIS — N2581 Secondary hyperparathyroidism of renal origin: Secondary | ICD-10-CM | POA: Diagnosis not present

## 2018-10-23 DIAGNOSIS — D631 Anemia in chronic kidney disease: Secondary | ICD-10-CM | POA: Diagnosis not present

## 2018-10-25 DIAGNOSIS — D631 Anemia in chronic kidney disease: Secondary | ICD-10-CM | POA: Diagnosis not present

## 2018-10-25 DIAGNOSIS — Z992 Dependence on renal dialysis: Secondary | ICD-10-CM | POA: Diagnosis not present

## 2018-10-25 DIAGNOSIS — D509 Iron deficiency anemia, unspecified: Secondary | ICD-10-CM | POA: Diagnosis not present

## 2018-10-25 DIAGNOSIS — N186 End stage renal disease: Secondary | ICD-10-CM | POA: Diagnosis not present

## 2018-10-25 DIAGNOSIS — I482 Chronic atrial fibrillation, unspecified: Secondary | ICD-10-CM | POA: Diagnosis not present

## 2018-10-25 DIAGNOSIS — E119 Type 2 diabetes mellitus without complications: Secondary | ICD-10-CM | POA: Diagnosis not present

## 2018-10-25 DIAGNOSIS — N2581 Secondary hyperparathyroidism of renal origin: Secondary | ICD-10-CM | POA: Diagnosis not present

## 2018-10-27 DIAGNOSIS — Z992 Dependence on renal dialysis: Secondary | ICD-10-CM | POA: Diagnosis not present

## 2018-10-27 DIAGNOSIS — N186 End stage renal disease: Secondary | ICD-10-CM | POA: Diagnosis not present

## 2018-10-27 DIAGNOSIS — E119 Type 2 diabetes mellitus without complications: Secondary | ICD-10-CM | POA: Diagnosis not present

## 2018-10-27 DIAGNOSIS — D509 Iron deficiency anemia, unspecified: Secondary | ICD-10-CM | POA: Diagnosis not present

## 2018-10-27 DIAGNOSIS — D631 Anemia in chronic kidney disease: Secondary | ICD-10-CM | POA: Diagnosis not present

## 2018-10-27 DIAGNOSIS — N2581 Secondary hyperparathyroidism of renal origin: Secondary | ICD-10-CM | POA: Diagnosis not present

## 2018-10-29 DIAGNOSIS — N186 End stage renal disease: Secondary | ICD-10-CM | POA: Diagnosis not present

## 2018-10-29 DIAGNOSIS — N2581 Secondary hyperparathyroidism of renal origin: Secondary | ICD-10-CM | POA: Diagnosis not present

## 2018-10-29 DIAGNOSIS — E119 Type 2 diabetes mellitus without complications: Secondary | ICD-10-CM | POA: Diagnosis not present

## 2018-10-29 DIAGNOSIS — D509 Iron deficiency anemia, unspecified: Secondary | ICD-10-CM | POA: Diagnosis not present

## 2018-10-29 DIAGNOSIS — Z992 Dependence on renal dialysis: Secondary | ICD-10-CM | POA: Diagnosis not present

## 2018-10-29 DIAGNOSIS — D631 Anemia in chronic kidney disease: Secondary | ICD-10-CM | POA: Diagnosis not present

## 2018-10-30 DIAGNOSIS — N2581 Secondary hyperparathyroidism of renal origin: Secondary | ICD-10-CM | POA: Diagnosis not present

## 2018-10-30 DIAGNOSIS — N186 End stage renal disease: Secondary | ICD-10-CM | POA: Diagnosis not present

## 2018-10-30 DIAGNOSIS — E119 Type 2 diabetes mellitus without complications: Secondary | ICD-10-CM | POA: Diagnosis not present

## 2018-10-30 DIAGNOSIS — Z992 Dependence on renal dialysis: Secondary | ICD-10-CM | POA: Diagnosis not present

## 2018-10-30 DIAGNOSIS — D631 Anemia in chronic kidney disease: Secondary | ICD-10-CM | POA: Diagnosis not present

## 2018-10-30 DIAGNOSIS — D509 Iron deficiency anemia, unspecified: Secondary | ICD-10-CM | POA: Diagnosis not present

## 2018-11-01 ENCOUNTER — Ambulatory Visit: Payer: Medicare Other | Admitting: Family Medicine

## 2018-11-01 DIAGNOSIS — K7460 Unspecified cirrhosis of liver: Secondary | ICD-10-CM | POA: Diagnosis not present

## 2018-11-01 DIAGNOSIS — I482 Chronic atrial fibrillation, unspecified: Secondary | ICD-10-CM | POA: Diagnosis not present

## 2018-11-01 DIAGNOSIS — E119 Type 2 diabetes mellitus without complications: Secondary | ICD-10-CM | POA: Diagnosis not present

## 2018-11-01 DIAGNOSIS — Z992 Dependence on renal dialysis: Secondary | ICD-10-CM | POA: Diagnosis not present

## 2018-11-01 DIAGNOSIS — D509 Iron deficiency anemia, unspecified: Secondary | ICD-10-CM | POA: Diagnosis not present

## 2018-11-01 DIAGNOSIS — N186 End stage renal disease: Secondary | ICD-10-CM | POA: Diagnosis not present

## 2018-11-01 DIAGNOSIS — N2581 Secondary hyperparathyroidism of renal origin: Secondary | ICD-10-CM | POA: Diagnosis not present

## 2018-11-01 DIAGNOSIS — D631 Anemia in chronic kidney disease: Secondary | ICD-10-CM | POA: Diagnosis not present

## 2018-11-02 ENCOUNTER — Encounter: Payer: Self-pay | Admitting: Family Medicine

## 2018-11-02 ENCOUNTER — Ambulatory Visit (INDEPENDENT_AMBULATORY_CARE_PROVIDER_SITE_OTHER): Payer: Medicare Other | Admitting: Internal Medicine

## 2018-11-02 ENCOUNTER — Telehealth: Payer: Self-pay

## 2018-11-02 ENCOUNTER — Ambulatory Visit: Payer: Medicare Other | Attending: Family Medicine | Admitting: Family Medicine

## 2018-11-02 VITALS — BP 100/68 | HR 87 | Temp 98.0°F | Ht 65.0 in | Wt 141.0 lb

## 2018-11-02 DIAGNOSIS — G4733 Obstructive sleep apnea (adult) (pediatric): Secondary | ICD-10-CM | POA: Insufficient documentation

## 2018-11-02 DIAGNOSIS — G8929 Other chronic pain: Secondary | ICD-10-CM

## 2018-11-02 DIAGNOSIS — E1122 Type 2 diabetes mellitus with diabetic chronic kidney disease: Secondary | ICD-10-CM | POA: Diagnosis not present

## 2018-11-02 DIAGNOSIS — I2510 Atherosclerotic heart disease of native coronary artery without angina pectoris: Secondary | ICD-10-CM | POA: Insufficient documentation

## 2018-11-02 DIAGNOSIS — I079 Rheumatic tricuspid valve disease, unspecified: Secondary | ICD-10-CM

## 2018-11-02 DIAGNOSIS — E785 Hyperlipidemia, unspecified: Secondary | ICD-10-CM | POA: Insufficient documentation

## 2018-11-02 DIAGNOSIS — M545 Low back pain, unspecified: Secondary | ICD-10-CM

## 2018-11-02 DIAGNOSIS — I132 Hypertensive heart and chronic kidney disease with heart failure and with stage 5 chronic kidney disease, or end stage renal disease: Secondary | ICD-10-CM | POA: Diagnosis not present

## 2018-11-02 DIAGNOSIS — R531 Weakness: Secondary | ICD-10-CM | POA: Diagnosis not present

## 2018-11-02 DIAGNOSIS — I5042 Chronic combined systolic (congestive) and diastolic (congestive) heart failure: Secondary | ICD-10-CM | POA: Diagnosis not present

## 2018-11-02 DIAGNOSIS — Z9889 Other specified postprocedural states: Secondary | ICD-10-CM | POA: Diagnosis not present

## 2018-11-02 DIAGNOSIS — Z952 Presence of prosthetic heart valve: Secondary | ICD-10-CM | POA: Diagnosis not present

## 2018-11-02 DIAGNOSIS — I272 Pulmonary hypertension, unspecified: Secondary | ICD-10-CM | POA: Insufficient documentation

## 2018-11-02 DIAGNOSIS — Z79899 Other long term (current) drug therapy: Secondary | ICD-10-CM | POA: Diagnosis not present

## 2018-11-02 DIAGNOSIS — Z5181 Encounter for therapeutic drug level monitoring: Secondary | ICD-10-CM

## 2018-11-02 DIAGNOSIS — Z881 Allergy status to other antibiotic agents status: Secondary | ICD-10-CM | POA: Diagnosis not present

## 2018-11-02 DIAGNOSIS — N186 End stage renal disease: Secondary | ICD-10-CM

## 2018-11-02 DIAGNOSIS — G4700 Insomnia, unspecified: Secondary | ICD-10-CM | POA: Insufficient documentation

## 2018-11-02 DIAGNOSIS — E118 Type 2 diabetes mellitus with unspecified complications: Secondary | ICD-10-CM

## 2018-11-02 DIAGNOSIS — Z992 Dependence on renal dialysis: Secondary | ICD-10-CM | POA: Diagnosis not present

## 2018-11-02 DIAGNOSIS — Z7901 Long term (current) use of anticoagulants: Secondary | ICD-10-CM | POA: Insufficient documentation

## 2018-11-02 DIAGNOSIS — I059 Rheumatic mitral valve disease, unspecified: Secondary | ICD-10-CM

## 2018-11-02 DIAGNOSIS — G4709 Other insomnia: Secondary | ICD-10-CM

## 2018-11-02 DIAGNOSIS — I871 Compression of vein: Secondary | ICD-10-CM | POA: Diagnosis not present

## 2018-11-02 DIAGNOSIS — T82838A Hemorrhage of vascular prosthetic devices, implants and grafts, initial encounter: Secondary | ICD-10-CM | POA: Diagnosis not present

## 2018-11-02 DIAGNOSIS — I480 Paroxysmal atrial fibrillation: Secondary | ICD-10-CM

## 2018-11-02 DIAGNOSIS — T82858A Stenosis of vascular prosthetic devices, implants and grafts, initial encounter: Secondary | ICD-10-CM | POA: Diagnosis not present

## 2018-11-02 LAB — POCT GLYCOSYLATED HEMOGLOBIN (HGB A1C): HbA1c, POC (controlled diabetic range): 5.7 % (ref 0.0–7.0)

## 2018-11-02 LAB — POCT INR: INR: 2.7 (ref 2.0–3.0)

## 2018-11-02 LAB — GLUCOSE, POCT (MANUAL RESULT ENTRY): POC GLUCOSE: 124 mg/dL — AB (ref 70–99)

## 2018-11-02 MED ORDER — TRAZODONE HCL 50 MG PO TABS: 50.0000 mg | ORAL_TABLET | Freq: Every evening | ORAL | 3 refills | Status: DC | PRN

## 2018-11-02 MED ORDER — ACETAMINOPHEN-CODEINE #3 300-30 MG PO TABS: 1.0000 | ORAL_TABLET | Freq: Every evening | ORAL | 1 refills | Status: DC | PRN

## 2018-11-02 NOTE — Telephone Encounter (Signed)
At request of Dr Margarita Rana, call placed to [facility] # [phone], spoke to [place]. Informed her that the patient is in the clinic and Dr Margarita Rana would like to know if they would like to have patient's INR done while he is here in the clinic. [name] explained that at POC INR can be done while he is here. Call her with the results and have patient remain in the clinic until coumadin clinic can provide instructions for medication dosing, instructions can be printed and given to the patient.    This information was shared with Dr Margarita Rana.   This CM met with the patient regarding home health services. He said that he doesn't have a preference for home agencies.

## 2018-11-02 NOTE — Progress Notes (Signed)
Subjective:  Patient ID: Michael [name], male    DOB: 11/26/1960  Age: 57 y.o. MRN: [id number]  CC: Diabetes and Back Pain   HPI Bookert Righi is a 57 year old male with a history of Type 2DM (A1c 5.7), hypertension, hyperlipidemia, mitral valve replacement with porcine valve, tricuspid valve repair, atrial fibrillation (on anticoagulation with Coumadin and rate control with metoprolol),Chronic systolic heartfailure  (EF 50-55% from 11/2017), Cirrhosis, ESRD who presents today for follow-up visit. He had a visit with his vascular surgeon today where his AV fistula was evaluated and he complains of bleeding through the [place].  Review of his INR from the Coumadin clinic on 11/11 was 4.9 and he never followed up and has no upcoming appointment.  An INR in the clinic is 2.7 and this has been communicated to the Coumadin clinic for adjustment of his Coumadin.   He is tolerating his hemodialysis sessions but complains of weakness in his lower extremities and despite using a cane he sometimes feels like he is about to fall and is unable to climb the stairs.  He is also wondering if he can have PCS services. Denies hypoglycemia and is currently on diet management of diabetes mellitus  Past Medical History:  Diagnosis Date  . Anemia of chronic disease   . Asthma    said he does not know  . Chronic kidney disease (CKD), stage IV (severe) ([place])   . Chronic systolic CHF (congestive heart failure) (HCC)    a. prior EF normal; b. Echo 10/16: Mild LVH, EF 30-35%, mitral valve bioprosthesis present without evidence of stenosis or regurgitation, severe LAE, mild RVE with mild to moderately reduced RVSF, moderate RAE  . Cirrhosis ([place])   . Coronary artery disease   . Diabetes mellitus type 2 in obese ([place])   . Diabetes mellitus without complication ([place])   . Dyspnea   . GERD (gastroesophageal reflux disease)   . H/O tricuspid valve repair 2012  . History of echocardiogram    Echo at [facility] in  [facility] 4/12: mod LVH, EF 60-65%, mod LAE, tissue MVR ok, mod TR, mod pulmo HTN with RVSP 48 mmHg  . Hypertension   . Obstructive sleep apnea   . Paroxysmal atrial fibrillation (HCC)    s/p Maze procedure at time of MVR in 2011  . Pulmonary HTN ([place])   . 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 Thrombocytopenia ([place])     Past Surgical History:  Procedure Laterality Date  . AV FISTULA PLACEMENT Left 07/13/2017   Procedure: LEFT ARM ARTERIOVENOUS (AV) FISTULA CREATION;  Surgeon: Waynetta Sandy, MD;  Location: [place];  Service: Vascular;  Laterality: Left;  . BASCILIC VEIN TRANSPOSITION Left 11/03/2017   Procedure: BASILIC VEIN TRANSPOSITION SECOND STAGE;  Surgeon: Waynetta Sandy, MD;  Location: [place] [facility];  Service: Vascular;  Laterality: Left;  . CARDIAC SURGERY    . CARDIAC VALVE REPLACEMENT  2011  . CARDIOVERSION N/A 07/09/2017   Procedure: CARDIOVERSION;  Surgeon: Larey Dresser, MD;  Location: [facility] ENDOSCOPY;  Service: Cardiovascular;  Laterality: N/A;  . COLONOSCOPY N/A 07/16/2017   Procedure: COLONOSCOPY;  Surgeon: Jerene Bears, MD;  Location: [place];  Service: Gastroenterology;  Laterality: N/A;  . ESOPHAGOGASTRODUODENOSCOPY N/A 07/16/2017   Procedure: ESOPHAGOGASTRODUODENOSCOPY (EGD);  Surgeon: Jerene Bears, MD;  Location[facility]COPY;  Service: Gastroenterology;  Laterality: N/A;  . INSERTION OF DIALYSIS CATHETER Right 07/13/2017   Procedure: INSERTION OF DIALYSIS CATHETER RIGHT INTERNAL JUGULAR;  Surgeon:  Waynetta Sandy, MD;  Location: [place];  Service: Vascular;  Laterality: Right;  . RIGHT HEART CATH N/A 06/29/2017   Procedure: RIGHT HEART CATH;  Surgeon: Larey Dresser, MD;  Location: [place] CV LAB;  Service: Cardiovascular;  Laterality: N/A;  . RIGHT/LEFT HEART CATH AND CORONARY ANGIOGRAPHY N/A 08/19/2017   Procedure: RIGHT/LEFT HEART CATH AND CORONARY ANGIOGRAPHY;  Surgeon: Leonie Man, MD;  Location: [place] CV  LAB;  Service: Cardiovascular;  Laterality: N/A;  . TEE WITHOUT CARDIOVERSION N/A 07/09/2017   Procedure: TRANSESOPHAGEAL ECHOCARDIOGRAM (TEE);  Surgeon: Larey Dresser, MD;  Location: [facility] ENDOSCOPY;  Service: Cardiovascular;  Laterality: N/A;    Allergies  Allergen Reactions  . Triamcinolone Other (See Comments)    Felt like skin was burning     Outpatient Medications Prior to Visit  Medication Sig Dispense Refill  . acetaminophen (TYLENOL) 500 MG tablet Take 1,000 mg by mouth daily as needed for headache (pain).     Marland Kitchen amiodarone (PACERONE) 200 MG tablet TAKE 1 TABLET BY MOUTH TWICE A DAY 60 tablet 10  . atorvastatin (LIPITOR) 20 MG tablet TAKE 1 TABLET (20 MG TOTAL) BY MOUTH DAILY. PLEASE KEEP UPCOMING APPT. THANK YOU 90 tablet 1  . calcium acetate (PHOSLO) 667 MG capsule Take 1 capsule (667 mg total) by mouth 3 (three) times daily with meals. Please keep upcoming appt. Thank You 90 capsule 0  . colchicine 0.6 MG tablet Take 0.5 tablets (0.3 mg total) by mouth 2 (two) times a week. 30 tablet 0  . diphenhydramine-acetaminophen (TYLENOL PM) 25-500 MG TABS tablet Take 1 tablet by mouth at bedtime as needed (pain).    . fluticasone (FLONASE) 50 MCG/ACT nasal spray Place 2 sprays into both nostrils daily. 16 g 0  . folic acid (FOLVITE) 1 MG tablet TAKE 1 TABLET BY MOUTH EVERY DAY 90 tablet 0  . gabapentin (NEURONTIN) 100 MG capsule Take 200 mg by mouth at bedtime. FOR LEG PAIN    . lidocaine (XYLOCAINE) 2 % solution Swish and spit 15 ml Q 6 hrs PRN 200 mL 0  . metoprolol tartrate (LOPRESSOR) 25 MG tablet Take 1 tablet (25 mg total) by mouth 2 (two) times daily. 180 tablet 3  . multivitamin (RENA-VIT) TABS tablet Take 1 tablet by mouth at bedtime. 30 tablet 0  . OXYGEN Inhale 2 L into the lungs daily as needed (shortness of breath).     . warfarin (COUMADIN) 2.5 MG tablet TAKE  AS DIRECTED BY COUMADIN CLINIC 40 tablet 1  . diazepam (VALIUM) 5 MG tablet Take 5 mg by mouth at bedtime as needed  (sleep).    Marland Kitchen tiZANidine (ZANAFLEX) 4 MG tablet Take 1 tablet (4 mg total) by mouth every 8 (eight) hours as needed for muscle spasms. (Patient not taking: Reported on 11/02/2018) 60 tablet 1   No facility-administered medications prior to visit.     ROS Review of Systems  Constitutional: Negative for activity change and appetite change.  HENT: Negative for sinus pressure and sore throat.   Eyes: Negative for visual disturbance.  Respiratory: Negative for cough, chest tightness and shortness of breath.   Cardiovascular: Negative for chest pain and leg swelling.  Gastrointestinal: Negative for abdominal distention, abdominal pain, constipation and diarrhea.  Endocrine: Negative.   Genitourinary: Negative for dysuria.  Musculoskeletal: Positive for back pain. Negative for joint swelling and myalgias.  Skin: Negative for rash.  Allergic/Immunologic: Negative.   Neurological: Negative for weakness, light-headedness and numbness.  Psychiatric/Behavioral: Negative  for dysphoric mood and suicidal ideas.    Objective:  BP 100/68   Pulse 87   Temp 98 F (36.7 C) (Oral)   Ht 5\' 5"  (1.651 m)   Wt 141 lb (64 kg)   SpO2 94%   BMI 23.46 kg/m   BP/Weight 11/02/2018 09/14/2018 46/27/0350  Systolic BP 093 90 818  Diastolic BP 68 61 74  Wt. (Lbs) 141 159.6 -  BMI 23.46 26.56 -      Physical Exam Constitutional:      Appearance: He is well-developed.  Cardiovascular:     Rate and Rhythm: Normal rate.     Heart sounds: Normal heart sounds. No murmur.  Pulmonary:     Effort: Pulmonary effort is normal.     Breath sounds: Normal breath sounds. No wheezing or rales.  Chest:     Chest wall: No tenderness.  Abdominal:     General: Bowel sounds are normal. There is no distension.     Palpations: Abdomen is soft. There is no mass.     Tenderness: There is no abdominal tenderness.  Musculoskeletal:     Comments: Left AV fistula with bleeding.  Neurological:     Mental Status: He is  alert and oriented to person, place, and time.     Lab Results  Component Value Date   HGBA1C 5.7 11/02/2018    Assessment & Plan:   1. Diabetes mellitus with complication (HCC) Diet controlled with A1c of 5.7 - POCT glucose (manual entry) - POCT glycosylated hemoglobin (Hb A1C)  2. Paroxysmal atrial fibrillation (HCC) INR is 2.7 today Coumadin clinic called and appropriate dosing instruction provided to the patient  3. ESRD (end stage renal disease) ([place]) Continue hemodialysis as per schedule Pressure dressing applied to left AV fistula  4. Chronic combined systolic and diastolic congestive heart failure (HCC) EF 55 to 60% Euvolemic  5. Weakness Will benefit from PT-referred to home PT as outpatient PT will be a challenge due to his multiple appointments - Ambulatory referral to [place]  6. Other insomnia Commence trazodone - traZODone (DESYREL) 50 MG tablet; Take 1 tablet (50 mg total) by mouth at bedtime as needed for sleep.  Dispense: 30 tablet; Refill: 3  7. Chronic midline low back pain without sciatica Chronic low back pain - acetaminophen-codeine (TYLENOL #3) 300-30 MG tablet; Take 1 tablet by mouth at bedtime as needed for moderate pain.  Dispense: 30 tablet; Refill: 1   Meds ordered this encounter  Medications  . traZODone (DESYREL) 50 MG tablet    Sig: Take 1 tablet (50 mg total) by mouth at bedtime as needed for sleep.    Dispense:  30 tablet    Refill:  3  . acetaminophen-codeine (TYLENOL #3) 300-30 MG tablet    Sig: Take 1 tablet by mouth at bedtime as needed for moderate pain.    Dispense:  30 tablet    Refill:  1    Follow-up: Return in about 3 months (around 02/01/2019) for Follow up of chronic medical conditions.   Charlott Rakes MD

## 2018-11-02 NOTE — Patient Instructions (Signed)
Description   Today take 1.5 tablets then continue taking 1 tablet everyday.  Recheck in 2 weeks. Coumadin Clinic [phone] Pt's new number is [phone]

## 2018-11-03 DIAGNOSIS — N2581 Secondary hyperparathyroidism of renal origin: Secondary | ICD-10-CM | POA: Diagnosis not present

## 2018-11-03 DIAGNOSIS — D631 Anemia in chronic kidney disease: Secondary | ICD-10-CM | POA: Diagnosis not present

## 2018-11-03 DIAGNOSIS — Z992 Dependence on renal dialysis: Secondary | ICD-10-CM | POA: Diagnosis not present

## 2018-11-03 DIAGNOSIS — E119 Type 2 diabetes mellitus without complications: Secondary | ICD-10-CM | POA: Diagnosis not present

## 2018-11-03 DIAGNOSIS — N186 End stage renal disease: Secondary | ICD-10-CM | POA: Diagnosis not present

## 2018-11-03 DIAGNOSIS — D509 Iron deficiency anemia, unspecified: Secondary | ICD-10-CM | POA: Diagnosis not present

## 2018-11-05 DIAGNOSIS — Z992 Dependence on renal dialysis: Secondary | ICD-10-CM | POA: Diagnosis not present

## 2018-11-05 DIAGNOSIS — D509 Iron deficiency anemia, unspecified: Secondary | ICD-10-CM | POA: Diagnosis not present

## 2018-11-05 DIAGNOSIS — E119 Type 2 diabetes mellitus without complications: Secondary | ICD-10-CM | POA: Diagnosis not present

## 2018-11-05 DIAGNOSIS — N186 End stage renal disease: Secondary | ICD-10-CM | POA: Diagnosis not present

## 2018-11-05 DIAGNOSIS — N2581 Secondary hyperparathyroidism of renal origin: Secondary | ICD-10-CM | POA: Diagnosis not present

## 2018-11-05 DIAGNOSIS — D631 Anemia in chronic kidney disease: Secondary | ICD-10-CM | POA: Diagnosis not present

## 2018-11-06 DIAGNOSIS — Z992 Dependence on renal dialysis: Secondary | ICD-10-CM | POA: Diagnosis not present

## 2018-11-06 DIAGNOSIS — N2581 Secondary hyperparathyroidism of renal origin: Secondary | ICD-10-CM | POA: Diagnosis not present

## 2018-11-06 DIAGNOSIS — D509 Iron deficiency anemia, unspecified: Secondary | ICD-10-CM | POA: Diagnosis not present

## 2018-11-06 DIAGNOSIS — D631 Anemia in chronic kidney disease: Secondary | ICD-10-CM | POA: Diagnosis not present

## 2018-11-06 DIAGNOSIS — E119 Type 2 diabetes mellitus without complications: Secondary | ICD-10-CM | POA: Diagnosis not present

## 2018-11-06 DIAGNOSIS — N186 End stage renal disease: Secondary | ICD-10-CM | POA: Diagnosis not present

## 2018-11-08 DIAGNOSIS — Z992 Dependence on renal dialysis: Secondary | ICD-10-CM | POA: Diagnosis not present

## 2018-11-08 DIAGNOSIS — I482 Chronic atrial fibrillation, unspecified: Secondary | ICD-10-CM | POA: Diagnosis not present

## 2018-11-08 DIAGNOSIS — E119 Type 2 diabetes mellitus without complications: Secondary | ICD-10-CM | POA: Diagnosis not present

## 2018-11-08 DIAGNOSIS — D509 Iron deficiency anemia, unspecified: Secondary | ICD-10-CM | POA: Diagnosis not present

## 2018-11-08 DIAGNOSIS — N2581 Secondary hyperparathyroidism of renal origin: Secondary | ICD-10-CM | POA: Diagnosis not present

## 2018-11-08 DIAGNOSIS — D631 Anemia in chronic kidney disease: Secondary | ICD-10-CM | POA: Diagnosis not present

## 2018-11-08 DIAGNOSIS — N186 End stage renal disease: Secondary | ICD-10-CM | POA: Diagnosis not present

## 2018-11-09 DIAGNOSIS — N186 End stage renal disease: Secondary | ICD-10-CM | POA: Diagnosis not present

## 2018-11-09 DIAGNOSIS — D631 Anemia in chronic kidney disease: Secondary | ICD-10-CM | POA: Diagnosis not present

## 2018-11-09 DIAGNOSIS — E119 Type 2 diabetes mellitus without complications: Secondary | ICD-10-CM | POA: Diagnosis not present

## 2018-11-09 DIAGNOSIS — N2581 Secondary hyperparathyroidism of renal origin: Secondary | ICD-10-CM | POA: Diagnosis not present

## 2018-11-09 DIAGNOSIS — D509 Iron deficiency anemia, unspecified: Secondary | ICD-10-CM | POA: Diagnosis not present

## 2018-11-09 DIAGNOSIS — Z992 Dependence on renal dialysis: Secondary | ICD-10-CM | POA: Diagnosis not present

## 2018-11-12 DIAGNOSIS — N2581 Secondary hyperparathyroidism of renal origin: Secondary | ICD-10-CM | POA: Diagnosis not present

## 2018-11-12 DIAGNOSIS — N186 End stage renal disease: Secondary | ICD-10-CM | POA: Diagnosis not present

## 2018-11-12 DIAGNOSIS — D509 Iron deficiency anemia, unspecified: Secondary | ICD-10-CM | POA: Diagnosis not present

## 2018-11-12 DIAGNOSIS — D631 Anemia in chronic kidney disease: Secondary | ICD-10-CM | POA: Diagnosis not present

## 2018-11-12 DIAGNOSIS — Z992 Dependence on renal dialysis: Secondary | ICD-10-CM | POA: Diagnosis not present

## 2018-11-12 DIAGNOSIS — E119 Type 2 diabetes mellitus without complications: Secondary | ICD-10-CM | POA: Diagnosis not present

## 2018-11-13 DIAGNOSIS — D509 Iron deficiency anemia, unspecified: Secondary | ICD-10-CM | POA: Diagnosis not present

## 2018-11-13 DIAGNOSIS — N2581 Secondary hyperparathyroidism of renal origin: Secondary | ICD-10-CM | POA: Diagnosis not present

## 2018-11-13 DIAGNOSIS — D631 Anemia in chronic kidney disease: Secondary | ICD-10-CM | POA: Diagnosis not present

## 2018-11-13 DIAGNOSIS — E119 Type 2 diabetes mellitus without complications: Secondary | ICD-10-CM | POA: Diagnosis not present

## 2018-11-13 DIAGNOSIS — N186 End stage renal disease: Secondary | ICD-10-CM | POA: Diagnosis not present

## 2018-11-13 DIAGNOSIS — Z992 Dependence on renal dialysis: Secondary | ICD-10-CM | POA: Diagnosis not present

## 2018-11-14 DIAGNOSIS — N2581 Secondary hyperparathyroidism of renal origin: Secondary | ICD-10-CM | POA: Diagnosis not present

## 2018-11-14 DIAGNOSIS — N186 End stage renal disease: Secondary | ICD-10-CM | POA: Diagnosis not present

## 2018-11-14 DIAGNOSIS — D631 Anemia in chronic kidney disease: Secondary | ICD-10-CM | POA: Diagnosis not present

## 2018-11-14 DIAGNOSIS — D509 Iron deficiency anemia, unspecified: Secondary | ICD-10-CM | POA: Diagnosis not present

## 2018-11-14 DIAGNOSIS — Z992 Dependence on renal dialysis: Secondary | ICD-10-CM | POA: Diagnosis not present

## 2018-11-14 DIAGNOSIS — E119 Type 2 diabetes mellitus without complications: Secondary | ICD-10-CM | POA: Diagnosis not present

## 2018-11-14 DIAGNOSIS — I482 Chronic atrial fibrillation, unspecified: Secondary | ICD-10-CM | POA: Diagnosis not present

## 2018-11-15 ENCOUNTER — Other Ambulatory Visit: Payer: Self-pay | Admitting: Family Medicine

## 2018-11-15 DIAGNOSIS — E119 Type 2 diabetes mellitus without complications: Secondary | ICD-10-CM | POA: Diagnosis not present

## 2018-11-15 DIAGNOSIS — N2581 Secondary hyperparathyroidism of renal origin: Secondary | ICD-10-CM | POA: Diagnosis not present

## 2018-11-15 DIAGNOSIS — M545 Low back pain, unspecified: Secondary | ICD-10-CM

## 2018-11-15 DIAGNOSIS — D631 Anemia in chronic kidney disease: Secondary | ICD-10-CM | POA: Diagnosis not present

## 2018-11-15 DIAGNOSIS — D509 Iron deficiency anemia, unspecified: Secondary | ICD-10-CM | POA: Diagnosis not present

## 2018-11-15 DIAGNOSIS — N186 End stage renal disease: Secondary | ICD-10-CM | POA: Diagnosis not present

## 2018-11-15 DIAGNOSIS — Z992 Dependence on renal dialysis: Secondary | ICD-10-CM | POA: Diagnosis not present

## 2018-11-15 DIAGNOSIS — G8929 Other chronic pain: Secondary | ICD-10-CM

## 2018-11-16 ENCOUNTER — Ambulatory Visit (INDEPENDENT_AMBULATORY_CARE_PROVIDER_SITE_OTHER): Payer: Medicare Other | Admitting: Pharmacist

## 2018-11-16 DIAGNOSIS — Z5181 Encounter for therapeutic drug level monitoring: Secondary | ICD-10-CM | POA: Diagnosis not present

## 2018-11-16 DIAGNOSIS — I079 Rheumatic tricuspid valve disease, unspecified: Secondary | ICD-10-CM | POA: Diagnosis not present

## 2018-11-16 DIAGNOSIS — I059 Rheumatic mitral valve disease, unspecified: Secondary | ICD-10-CM | POA: Diagnosis not present

## 2018-11-16 LAB — POCT INR: INR: 1.7 — AB (ref 2.0–3.0)

## 2018-11-16 NOTE — Patient Instructions (Signed)
Today take 1.5 tablets then continue taking 1 tablet everyday.  Recheck in 2 weeks. Coumadin Clinic [phone]

## 2018-11-17 DIAGNOSIS — Z992 Dependence on renal dialysis: Secondary | ICD-10-CM | POA: Diagnosis not present

## 2018-11-17 DIAGNOSIS — N186 End stage renal disease: Secondary | ICD-10-CM | POA: Diagnosis not present

## 2018-11-17 DIAGNOSIS — I509 Heart failure, unspecified: Secondary | ICD-10-CM | POA: Diagnosis not present

## 2018-11-18 ENCOUNTER — Inpatient Hospital Stay: Payer: Medicare Other | Attending: Oncology | Admitting: Oncology

## 2018-11-18 DIAGNOSIS — I50810 Right heart failure, unspecified: Secondary | ICD-10-CM | POA: Diagnosis not present

## 2018-11-18 DIAGNOSIS — E876 Hypokalemia: Secondary | ICD-10-CM | POA: Diagnosis not present

## 2018-11-18 DIAGNOSIS — I132 Hypertensive heart and chronic kidney disease with heart failure and with stage 5 chronic kidney disease, or end stage renal disease: Secondary | ICD-10-CM | POA: Diagnosis not present

## 2018-11-18 DIAGNOSIS — I5022 Chronic systolic (congestive) heart failure: Secondary | ICD-10-CM | POA: Diagnosis not present

## 2018-11-18 DIAGNOSIS — D631 Anemia in chronic kidney disease: Secondary | ICD-10-CM | POA: Diagnosis not present

## 2018-11-18 DIAGNOSIS — N186 End stage renal disease: Secondary | ICD-10-CM | POA: Diagnosis not present

## 2018-11-18 DIAGNOSIS — N2581 Secondary hyperparathyroidism of renal origin: Secondary | ICD-10-CM | POA: Diagnosis not present

## 2018-11-18 DIAGNOSIS — Z992 Dependence on renal dialysis: Secondary | ICD-10-CM | POA: Diagnosis not present

## 2018-11-18 DIAGNOSIS — E119 Type 2 diabetes mellitus without complications: Secondary | ICD-10-CM | POA: Diagnosis not present

## 2018-11-20 DIAGNOSIS — N2581 Secondary hyperparathyroidism of renal origin: Secondary | ICD-10-CM | POA: Diagnosis not present

## 2018-11-20 DIAGNOSIS — D631 Anemia in chronic kidney disease: Secondary | ICD-10-CM | POA: Diagnosis not present

## 2018-11-20 DIAGNOSIS — E876 Hypokalemia: Secondary | ICD-10-CM | POA: Diagnosis not present

## 2018-11-20 DIAGNOSIS — E119 Type 2 diabetes mellitus without complications: Secondary | ICD-10-CM | POA: Diagnosis not present

## 2018-11-20 DIAGNOSIS — Z992 Dependence on renal dialysis: Secondary | ICD-10-CM | POA: Diagnosis not present

## 2018-11-20 DIAGNOSIS — N186 End stage renal disease: Secondary | ICD-10-CM | POA: Diagnosis not present

## 2018-11-22 ENCOUNTER — Other Ambulatory Visit: Payer: Self-pay | Admitting: Family Medicine

## 2018-11-22 DIAGNOSIS — N2581 Secondary hyperparathyroidism of renal origin: Secondary | ICD-10-CM | POA: Diagnosis not present

## 2018-11-22 DIAGNOSIS — E119 Type 2 diabetes mellitus without complications: Secondary | ICD-10-CM | POA: Diagnosis not present

## 2018-11-22 DIAGNOSIS — I482 Chronic atrial fibrillation, unspecified: Secondary | ICD-10-CM | POA: Diagnosis not present

## 2018-11-22 DIAGNOSIS — Z992 Dependence on renal dialysis: Secondary | ICD-10-CM | POA: Diagnosis not present

## 2018-11-22 DIAGNOSIS — N186 End stage renal disease: Secondary | ICD-10-CM | POA: Diagnosis not present

## 2018-11-22 DIAGNOSIS — E876 Hypokalemia: Secondary | ICD-10-CM | POA: Diagnosis not present

## 2018-11-22 DIAGNOSIS — D631 Anemia in chronic kidney disease: Secondary | ICD-10-CM | POA: Diagnosis not present

## 2018-11-24 DIAGNOSIS — Z992 Dependence on renal dialysis: Secondary | ICD-10-CM | POA: Diagnosis not present

## 2018-11-24 DIAGNOSIS — D631 Anemia in chronic kidney disease: Secondary | ICD-10-CM | POA: Diagnosis not present

## 2018-11-24 DIAGNOSIS — N2581 Secondary hyperparathyroidism of renal origin: Secondary | ICD-10-CM | POA: Diagnosis not present

## 2018-11-24 DIAGNOSIS — N186 End stage renal disease: Secondary | ICD-10-CM | POA: Diagnosis not present

## 2018-11-24 DIAGNOSIS — E876 Hypokalemia: Secondary | ICD-10-CM | POA: Diagnosis not present

## 2018-11-24 DIAGNOSIS — E119 Type 2 diabetes mellitus without complications: Secondary | ICD-10-CM | POA: Diagnosis not present

## 2018-11-25 ENCOUNTER — Other Ambulatory Visit: Payer: Self-pay | Admitting: Family Medicine

## 2018-11-25 DIAGNOSIS — G4709 Other insomnia: Secondary | ICD-10-CM

## 2018-11-26 ENCOUNTER — Ambulatory Visit: Payer: Medicare Other | Admitting: Internal Medicine

## 2018-11-26 DIAGNOSIS — E876 Hypokalemia: Secondary | ICD-10-CM | POA: Diagnosis not present

## 2018-11-26 DIAGNOSIS — Z992 Dependence on renal dialysis: Secondary | ICD-10-CM | POA: Diagnosis not present

## 2018-11-26 DIAGNOSIS — N186 End stage renal disease: Secondary | ICD-10-CM | POA: Diagnosis not present

## 2018-11-26 DIAGNOSIS — N2581 Secondary hyperparathyroidism of renal origin: Secondary | ICD-10-CM | POA: Diagnosis not present

## 2018-11-26 DIAGNOSIS — D631 Anemia in chronic kidney disease: Secondary | ICD-10-CM | POA: Diagnosis not present

## 2018-11-26 DIAGNOSIS — E119 Type 2 diabetes mellitus without complications: Secondary | ICD-10-CM | POA: Diagnosis not present

## 2018-11-27 DIAGNOSIS — Z992 Dependence on renal dialysis: Secondary | ICD-10-CM | POA: Diagnosis not present

## 2018-11-27 DIAGNOSIS — N2581 Secondary hyperparathyroidism of renal origin: Secondary | ICD-10-CM | POA: Diagnosis not present

## 2018-11-27 DIAGNOSIS — D631 Anemia in chronic kidney disease: Secondary | ICD-10-CM | POA: Diagnosis not present

## 2018-11-27 DIAGNOSIS — N186 End stage renal disease: Secondary | ICD-10-CM | POA: Diagnosis not present

## 2018-11-27 DIAGNOSIS — E876 Hypokalemia: Secondary | ICD-10-CM | POA: Diagnosis not present

## 2018-11-27 DIAGNOSIS — E119 Type 2 diabetes mellitus without complications: Secondary | ICD-10-CM | POA: Diagnosis not present

## 2018-11-29 DIAGNOSIS — N186 End stage renal disease: Secondary | ICD-10-CM | POA: Diagnosis not present

## 2018-11-29 DIAGNOSIS — E119 Type 2 diabetes mellitus without complications: Secondary | ICD-10-CM | POA: Diagnosis not present

## 2018-11-29 DIAGNOSIS — D631 Anemia in chronic kidney disease: Secondary | ICD-10-CM | POA: Diagnosis not present

## 2018-11-29 DIAGNOSIS — N2581 Secondary hyperparathyroidism of renal origin: Secondary | ICD-10-CM | POA: Diagnosis not present

## 2018-11-29 DIAGNOSIS — E876 Hypokalemia: Secondary | ICD-10-CM | POA: Diagnosis not present

## 2018-11-29 DIAGNOSIS — Z992 Dependence on renal dialysis: Secondary | ICD-10-CM | POA: Diagnosis not present

## 2018-11-29 DIAGNOSIS — I482 Chronic atrial fibrillation, unspecified: Secondary | ICD-10-CM | POA: Diagnosis not present

## 2018-11-30 ENCOUNTER — Ambulatory Visit (INDEPENDENT_AMBULATORY_CARE_PROVIDER_SITE_OTHER): Payer: Medicare Other | Admitting: Pharmacist

## 2018-11-30 DIAGNOSIS — Z5181 Encounter for therapeutic drug level monitoring: Secondary | ICD-10-CM | POA: Diagnosis not present

## 2018-11-30 DIAGNOSIS — I079 Rheumatic tricuspid valve disease, unspecified: Secondary | ICD-10-CM

## 2018-11-30 DIAGNOSIS — I059 Rheumatic mitral valve disease, unspecified: Secondary | ICD-10-CM | POA: Diagnosis not present

## 2018-11-30 LAB — POCT INR: INR: 3.6 — AB (ref 2.0–3.0)

## 2018-11-30 NOTE — Patient Instructions (Signed)
Hold coumadin today then continue taking 1 tablet everyday.  Recheck in 2 weeks. Coumadin Clinic [phone] Pt's new number is [phone]

## 2018-12-01 DIAGNOSIS — N2581 Secondary hyperparathyroidism of renal origin: Secondary | ICD-10-CM | POA: Diagnosis not present

## 2018-12-01 DIAGNOSIS — Z114 Encounter for screening for human immunodeficiency virus [HIV]: Secondary | ICD-10-CM | POA: Diagnosis not present

## 2018-12-01 DIAGNOSIS — E876 Hypokalemia: Secondary | ICD-10-CM | POA: Diagnosis not present

## 2018-12-01 DIAGNOSIS — E119 Type 2 diabetes mellitus without complications: Secondary | ICD-10-CM | POA: Diagnosis not present

## 2018-12-01 DIAGNOSIS — N186 End stage renal disease: Secondary | ICD-10-CM | POA: Diagnosis not present

## 2018-12-01 DIAGNOSIS — Z992 Dependence on renal dialysis: Secondary | ICD-10-CM | POA: Diagnosis not present

## 2018-12-01 DIAGNOSIS — D631 Anemia in chronic kidney disease: Secondary | ICD-10-CM | POA: Diagnosis not present

## 2018-12-03 DIAGNOSIS — N2581 Secondary hyperparathyroidism of renal origin: Secondary | ICD-10-CM | POA: Diagnosis not present

## 2018-12-03 DIAGNOSIS — D631 Anemia in chronic kidney disease: Secondary | ICD-10-CM | POA: Diagnosis not present

## 2018-12-03 DIAGNOSIS — E119 Type 2 diabetes mellitus without complications: Secondary | ICD-10-CM | POA: Diagnosis not present

## 2018-12-03 DIAGNOSIS — E876 Hypokalemia: Secondary | ICD-10-CM | POA: Diagnosis not present

## 2018-12-03 DIAGNOSIS — Z992 Dependence on renal dialysis: Secondary | ICD-10-CM | POA: Diagnosis not present

## 2018-12-03 DIAGNOSIS — N186 End stage renal disease: Secondary | ICD-10-CM | POA: Diagnosis not present

## 2018-12-04 DIAGNOSIS — D631 Anemia in chronic kidney disease: Secondary | ICD-10-CM | POA: Diagnosis not present

## 2018-12-04 DIAGNOSIS — E876 Hypokalemia: Secondary | ICD-10-CM | POA: Diagnosis not present

## 2018-12-04 DIAGNOSIS — E119 Type 2 diabetes mellitus without complications: Secondary | ICD-10-CM | POA: Diagnosis not present

## 2018-12-04 DIAGNOSIS — N186 End stage renal disease: Secondary | ICD-10-CM | POA: Diagnosis not present

## 2018-12-04 DIAGNOSIS — N2581 Secondary hyperparathyroidism of renal origin: Secondary | ICD-10-CM | POA: Diagnosis not present

## 2018-12-04 DIAGNOSIS — Z992 Dependence on renal dialysis: Secondary | ICD-10-CM | POA: Diagnosis not present

## 2018-12-06 DIAGNOSIS — E876 Hypokalemia: Secondary | ICD-10-CM | POA: Diagnosis not present

## 2018-12-06 DIAGNOSIS — N186 End stage renal disease: Secondary | ICD-10-CM | POA: Diagnosis not present

## 2018-12-06 DIAGNOSIS — D631 Anemia in chronic kidney disease: Secondary | ICD-10-CM | POA: Diagnosis not present

## 2018-12-06 DIAGNOSIS — I482 Chronic atrial fibrillation, unspecified: Secondary | ICD-10-CM | POA: Diagnosis not present

## 2018-12-06 DIAGNOSIS — Z992 Dependence on renal dialysis: Secondary | ICD-10-CM | POA: Diagnosis not present

## 2018-12-06 DIAGNOSIS — E119 Type 2 diabetes mellitus without complications: Secondary | ICD-10-CM | POA: Diagnosis not present

## 2018-12-06 DIAGNOSIS — N2581 Secondary hyperparathyroidism of renal origin: Secondary | ICD-10-CM | POA: Diagnosis not present

## 2018-12-08 DIAGNOSIS — N186 End stage renal disease: Secondary | ICD-10-CM | POA: Diagnosis not present

## 2018-12-08 DIAGNOSIS — E876 Hypokalemia: Secondary | ICD-10-CM | POA: Diagnosis not present

## 2018-12-08 DIAGNOSIS — N2581 Secondary hyperparathyroidism of renal origin: Secondary | ICD-10-CM | POA: Diagnosis not present

## 2018-12-08 DIAGNOSIS — D631 Anemia in chronic kidney disease: Secondary | ICD-10-CM | POA: Diagnosis not present

## 2018-12-08 DIAGNOSIS — E119 Type 2 diabetes mellitus without complications: Secondary | ICD-10-CM | POA: Diagnosis not present

## 2018-12-08 DIAGNOSIS — Z992 Dependence on renal dialysis: Secondary | ICD-10-CM | POA: Diagnosis not present

## 2018-12-09 ENCOUNTER — Ambulatory Visit: Payer: Medicare Other | Attending: Family Medicine | Admitting: Physician Assistant

## 2018-12-09 ENCOUNTER — Telehealth: Payer: Self-pay

## 2018-12-09 VITALS — BP 90/53 | HR 71 | Temp 97.9°F | Ht 65.0 in | Wt 146.4 lb

## 2018-12-09 DIAGNOSIS — I10 Essential (primary) hypertension: Secondary | ICD-10-CM | POA: Diagnosis not present

## 2018-12-09 DIAGNOSIS — Z7951 Long term (current) use of inhaled steroids: Secondary | ICD-10-CM | POA: Diagnosis not present

## 2018-12-09 DIAGNOSIS — Z888 Allergy status to other drugs, medicaments and biological substances status: Secondary | ICD-10-CM | POA: Diagnosis not present

## 2018-12-09 DIAGNOSIS — R112 Nausea with vomiting, unspecified: Secondary | ICD-10-CM | POA: Diagnosis not present

## 2018-12-09 DIAGNOSIS — Z952 Presence of prosthetic heart valve: Secondary | ICD-10-CM | POA: Insufficient documentation

## 2018-12-09 DIAGNOSIS — I272 Pulmonary hypertension, unspecified: Secondary | ICD-10-CM | POA: Insufficient documentation

## 2018-12-09 DIAGNOSIS — M545 Low back pain, unspecified: Secondary | ICD-10-CM

## 2018-12-09 DIAGNOSIS — R110 Nausea: Secondary | ICD-10-CM | POA: Diagnosis not present

## 2018-12-09 DIAGNOSIS — N186 End stage renal disease: Secondary | ICD-10-CM | POA: Insufficient documentation

## 2018-12-09 DIAGNOSIS — M549 Dorsalgia, unspecified: Secondary | ICD-10-CM | POA: Diagnosis present

## 2018-12-09 DIAGNOSIS — I2510 Atherosclerotic heart disease of native coronary artery without angina pectoris: Secondary | ICD-10-CM | POA: Diagnosis not present

## 2018-12-09 DIAGNOSIS — I132 Hypertensive heart and chronic kidney disease with heart failure and with stage 5 chronic kidney disease, or end stage renal disease: Secondary | ICD-10-CM | POA: Insufficient documentation

## 2018-12-09 DIAGNOSIS — G4733 Obstructive sleep apnea (adult) (pediatric): Secondary | ICD-10-CM | POA: Diagnosis not present

## 2018-12-09 DIAGNOSIS — Z9981 Dependence on supplemental oxygen: Secondary | ICD-10-CM | POA: Insufficient documentation

## 2018-12-09 DIAGNOSIS — Z79899 Other long term (current) drug therapy: Secondary | ICD-10-CM | POA: Diagnosis not present

## 2018-12-09 DIAGNOSIS — I5022 Chronic systolic (congestive) heart failure: Secondary | ICD-10-CM | POA: Insufficient documentation

## 2018-12-09 DIAGNOSIS — I480 Paroxysmal atrial fibrillation: Secondary | ICD-10-CM | POA: Insufficient documentation

## 2018-12-09 DIAGNOSIS — L299 Pruritus, unspecified: Secondary | ICD-10-CM | POA: Diagnosis not present

## 2018-12-09 DIAGNOSIS — E118 Type 2 diabetes mellitus with unspecified complications: Secondary | ICD-10-CM

## 2018-12-09 DIAGNOSIS — K7460 Unspecified cirrhosis of liver: Secondary | ICD-10-CM | POA: Insufficient documentation

## 2018-12-09 DIAGNOSIS — E1122 Type 2 diabetes mellitus with diabetic chronic kidney disease: Secondary | ICD-10-CM | POA: Insufficient documentation

## 2018-12-09 DIAGNOSIS — Z992 Dependence on renal dialysis: Secondary | ICD-10-CM | POA: Diagnosis not present

## 2018-12-09 DIAGNOSIS — Z7901 Long term (current) use of anticoagulants: Secondary | ICD-10-CM | POA: Diagnosis not present

## 2018-12-09 LAB — GLUCOSE, POCT (MANUAL RESULT ENTRY): POC GLUCOSE: 80 mg/dL (ref 70–99)

## 2018-12-09 MED ORDER — TRAMADOL HCL 50 MG PO TABS: 50.0000 mg | ORAL_TABLET | Freq: Three times a day (TID) | ORAL | 0 refills | Status: DC | PRN

## 2018-12-09 MED ORDER — CETIRIZINE HCL 10 MG PO TABS: 10.0000 mg | ORAL_TABLET | Freq: Every day | ORAL | 11 refills | Status: DC

## 2018-12-09 MED ORDER — ONDANSETRON 4 MG PO TBDP: 4.0000 mg | ORAL_TABLET | Freq: Three times a day (TID) | ORAL | 0 refills | Status: DC | PRN

## 2018-12-09 NOTE — Telephone Encounter (Signed)
Patient needs a letter stating his medical condition so that he can re certify for his green card.

## 2018-12-09 NOTE — Progress Notes (Signed)
Patient ID: Michael [name], male   DOB: 20-Feb-1961, 58 y.o.   MRN: [id number]   Michael [name], is a 58 y.o. male  WUJ:[id number]  [id number]  DOB - 11/23/1960  Subjective:  Chief Complaint and HPI: Michael [name] is a 58 y.o. male here today for ongoing back pain.  Says the tylenol #3 did not help as much as tramadol and would like to go back to that.  No Xrays in about 1 year.  He would like to see a specialist to see what other options there are for pain control.  Pain is mid back.  Does not radiate.  Pain is moderate and occurs daily.     He has been having intermittent episodes of nausea with a few episodes of vomiting over the last 3 months.  No abdominal pain.  No urinary s/sx.  Dialysis is M,W,F, sat.  Compliant with meds.  BP usus low.  Denies SOB, CP, Palpitations.    ROS:   Constitutional:  No f/c, No night sweats, No unexplained weight loss. EENT:  No vision changes, No blurry vision, No hearing changes. No mouth, throat, or ear problems.  Respiratory: No cough, No SOB Cardiac: No CP, no palpitations GI:  No abd pain, No change in BMs GU: No Urinary s/sx Musculoskeletal: No joint pain Neuro: No headache, no dizziness, no motor weakness.  Skin: No rash Endocrine:  No polydipsia. No polyuria.  Psych: Denies SI/HI  No problems updated.  ALLERGIES: Allergies  Allergen Reactions  . Triamcinolone Other (See Comments)    Felt like skin was burning    PAST MEDICAL HISTORY: Past Medical History:  Diagnosis Date  . Anemia of chronic disease   . Asthma    said he does not know  . Chronic kidney disease (CKD), stage IV (severe) ([place])   . Chronic systolic CHF (congestive heart failure) (HCC)    a. prior EF normal; b. Echo 10/16: Mild LVH, EF 30-35%, mitral valve bioprosthesis present without evidence of stenosis or regurgitation, severe LAE, mild RVE with mild to moderately reduced RVSF, moderate RAE  . Cirrhosis ([place])   . Coronary artery disease   .  Diabetes mellitus type 2 in obese ([place])   . Diabetes mellitus without complication ([place])   . Dyspnea   . GERD (gastroesophageal reflux disease)   . H/O tricuspid valve repair 2012  . History of echocardiogram    Echo at [facility] in [facility] 4/12: mod LVH, EF 60-65%, mod LAE, tissue MVR ok, mod TR, mod pulmo HTN with RVSP 48 mmHg  . Hypertension   . Obstructive sleep apnea   . Paroxysmal atrial fibrillation (HCC)    s/p Maze procedure at time of MVR in 2011  . Pulmonary HTN ([place])   . 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 Thrombocytopenia ([place])     MEDICATIONS AT HOME: Prior to Admission medications   Medication Sig Start Date End Date Taking? Authorizing Provider  amiodarone (PACERONE) 200 MG tablet TAKE 1 TABLET BY MOUTH TWICE A DAY 07/14/18  Yes Fay Records, MD  atorvastatin (LIPITOR) 20 MG tablet TAKE 1 TABLET (20 MG TOTAL) BY MOUTH DAILY. PLEASE KEEP UPCOMING APPT. THANK YOU 06/07/18  Yes Fay Records, MD  calcium acetate (PHOSLO) 667 MG capsule Take 1 capsule (667 mg total) by mouth 3 (three) times daily with meals. Please keep upcoming appt. Thank You 03/09/18  Yes Fay Records, MD  colchicine 0.6 MG tablet Take 0.5 tablets (0.3 mg total)  by mouth 2 (two) times a week. 09/21/17  Yes Tawny Asal, MD  diazepam (VALIUM) 5 MG tablet Take 5 mg by mouth at bedtime as needed (sleep).   Yes [provider]  diphenhydramine-acetaminophen (TYLENOL PM) 25-500 MG TABS tablet Take 1 tablet by mouth at bedtime as needed (pain).   Yes [provider]  fluticasone (FLONASE) 50 MCG/ACT nasal spray Place 2 sprays into both nostrils daily. 12/14/17  Yes Bonnielee Haff, MD  folic acid (FOLVITE) 1 MG tablet TAKE 1 TABLET BY MOUTH EVERY DAY 11/22/18  Yes Charlott Rakes, MD  gabapentin (NEURONTIN) 100 MG capsule Take 200 mg by mouth at bedtime. FOR LEG PAIN   Yes [provider]  lidocaine (XYLOCAINE) 2 % solution Swish and spit 15 ml Q 6 hrs PRN 09/14/18  Yes  Ladell Pier, MD  metoprolol tartrate (LOPRESSOR) 25 MG tablet Take 1 tablet (25 mg total) by mouth 2 (two) times daily. 07/22/18  Yes Daune Perch, NP  multivitamin (RENA-VIT) TABS tablet Take 1 tablet by mouth at bedtime. 12/14/17  Yes Bonnielee Haff, MD  OXYGEN Inhale 2 L into the lungs daily as needed (shortness of breath).    Yes [provider]  tiZANidine (ZANAFLEX) 4 MG tablet TAKE 1 TABLET (4 MG TOTAL) BY MOUTH EVERY 8 (EIGHT) HOURS AS NEEDED FOR MUSCLE SPASMS. 11/16/18  Yes Charlott Rakes, MD  traZODone (DESYREL) 50 MG tablet TAKE 1 TABLET BY MOUTH AT BEDTIME AS NEEDED FOR SLEEP 11/26/18  Yes Charlott Rakes, MD  warfarin (COUMADIN) 2.5 MG tablet TAKE  AS DIRECTED BY COUMADIN CLINIC 09/07/18  Yes Fay Records, MD  ondansetron (ZOFRAN-ODT) 4 MG disintegrating tablet Take 1 tablet (4 mg total) by mouth every 8 (eight) hours as needed for nausea or vomiting. 12/09/18   Argentina Donovan, PA-C  traMADol (ULTRAM) 50 MG tablet Take 1 tablet (50 mg total) by mouth every 8 (eight) hours as needed. 12/09/18   Argentina Donovan, PA-C     Objective:  EXAM:   Vitals:   12/09/18 0837  BP: (!) 90/53  Pulse: 71  Temp: 97.9 F (36.6 C)  TempSrc: Oral  SpO2: 96%  Weight: 146 lb 6.4 oz (66.4 kg)  Height: 5\' 5"  (1.651 m)    General appearance : A&OX3. NAD. Non-toxic-appearing, appears to be in poor health HEENT: Atraumatic and Normocephalic.  PERRLA. EOM intact.   Chest/Lungs:  Breathing-non-labored, Good air entry bilaterally, breath sounds normal without rales, rhonchi, or wheezing  CVS: irrregular, no murmurs, gallops, rubs  Abdomen: Bowel sounds present, Non tender and not distended with no gaurding, rigidity or rebound. Extremities: Bilateral Lower Ext shows no edema, both legs are warm to touch with = pulse throughout  Appropriate eye contact and affect.  Skin:  No Rash  Data Review Lab Results  Component Value Date   HGBA1C 5.7 11/02/2018   HGBA1C 4.9 11/24/2017     HGBA1C 5.2 09/01/2017     Assessment & Plan   1. Low back pain without sciatica, unspecified back pain laterality, unspecified chronicity-ongoing, not ne No red flags - DG Lumbar Spine Complete; Future - Ambulatory referral to Orthopedic Surgery - traMADol (ULTRAM) 50 MG tablet; Take 1 tablet (50 mg total) by mouth every 8 (eight) hours as needed.  Dispense: 30 tablet; Refill: 0  2. Diabetes mellitus with complication (HCC) controlled - Glucose (CBG) - Comprehensive metabolic panel  3. ESRD (end stage renal disease) on dialysis [facility]) Continue dialysis - Comprehensive metabolic panel  4.  Essential hypertension-hypotensive/standard for this patient.  No new symptoms - Comprehensive metabolic panel - CBC with Differential/Platelet  5. Nausea No red flags today - Comprehensive metabolic panel - CBC with Differential/Platelet - ondansetron (ZOFRAN-ODT) 4 MG disintegrating tablet; Take 1 tablet (4 mg total) by mouth every 8 (eight) hours as needed for nausea or vomiting.  Dispense: 20 tablet; Refill: 0   Patient have been counseled extensively about nutrition and exercise  Return in about 3 weeks (around 12/30/2018) for [place]-f/up back pain and nausea.  The patient was given clear instructions to go to ER or return to medical center if symptoms don't improve, worsen or new problems develop. The patient verbalized understanding. The patient was told to call to get lab results if they haven't heard anything in the next week.     Freeman Caldron, PA-C [facility] [place], [place]   12/09/2018, 9:09 AM

## 2018-12-09 NOTE — Progress Notes (Signed)
Patient states that he has been throwing up.  Patient states that his body itches all over.

## 2018-12-10 ENCOUNTER — Telehealth: Payer: Self-pay

## 2018-12-10 DIAGNOSIS — E876 Hypokalemia: Secondary | ICD-10-CM | POA: Diagnosis not present

## 2018-12-10 DIAGNOSIS — Z992 Dependence on renal dialysis: Secondary | ICD-10-CM | POA: Diagnosis not present

## 2018-12-10 DIAGNOSIS — E119 Type 2 diabetes mellitus without complications: Secondary | ICD-10-CM | POA: Diagnosis not present

## 2018-12-10 DIAGNOSIS — N186 End stage renal disease: Secondary | ICD-10-CM | POA: Diagnosis not present

## 2018-12-10 DIAGNOSIS — N2581 Secondary hyperparathyroidism of renal origin: Secondary | ICD-10-CM | POA: Diagnosis not present

## 2018-12-10 DIAGNOSIS — D631 Anemia in chronic kidney disease: Secondary | ICD-10-CM | POA: Diagnosis not present

## 2018-12-10 LAB — COMPREHENSIVE METABOLIC PANEL
A/G RATIO: 1 — AB (ref 1.2–2.2)
ALT: 29 IU/L (ref 0–44)
AST: 43 IU/L — ABNORMAL HIGH (ref 0–40)
Albumin: 4.2 g/dL (ref 3.8–4.9)
Alkaline Phosphatase: 239 IU/L — ABNORMAL HIGH (ref 39–117)
BUN/Creatinine Ratio: 5 — ABNORMAL LOW (ref 9–20)
BUN: 28 mg/dL — ABNORMAL HIGH (ref 6–24)
Bilirubin Total: 1.4 mg/dL — ABNORMAL HIGH (ref 0.0–1.2)
CO2: 26 mmol/L (ref 20–29)
Calcium: 9.3 mg/dL (ref 8.7–10.2)
Chloride: 92 mmol/L — ABNORMAL LOW (ref 96–106)
Creatinine, Ser: 5.54 mg/dL — ABNORMAL HIGH (ref 0.76–1.27)
GFR calc Af Amer: 12 mL/min/{1.73_m2} — ABNORMAL LOW (ref >59)
GFR calc non Af Amer: 10 mL/min/{1.73_m2} — ABNORMAL LOW (ref >59)
GLOBULIN, TOTAL: 4.3 g/dL (ref 1.5–4.5)
Glucose: 81 mg/dL (ref 65–99)
POTASSIUM: 4.2 mmol/L (ref 3.5–5.2)
SODIUM: 138 mmol/L (ref 134–144)
Total Protein: 8.5 g/dL (ref 6.0–8.5)

## 2018-12-10 LAB — CBC WITH DIFFERENTIAL/PLATELET
Basophils Absolute: 0.1 10*3/uL (ref 0.0–0.2)
Basos: 1 %
EOS (ABSOLUTE): 0.2 10*3/uL (ref 0.0–0.4)
Eos: 4 %
Hematocrit: 38.1 % (ref 37.5–51.0)
Hemoglobin: 12.5 g/dL — ABNORMAL LOW (ref 13.0–17.7)
Immature Grans (Abs): 0 10*3/uL (ref 0.0–0.1)
Immature Granulocytes: 0 %
LYMPHS ABS: 1.1 10*3/uL (ref 0.7–3.1)
Lymphs: 20 %
MCH: 25.4 pg — ABNORMAL LOW (ref 26.6–33.0)
MCHC: 32.8 g/dL (ref 31.5–35.7)
MCV: 77 fL — ABNORMAL LOW (ref 79–97)
Monocytes Absolute: 0.9 10*3/uL (ref 0.1–0.9)
Monocytes: 16 %
Neutrophils Absolute: 3.3 10*3/uL (ref 1.4–7.0)
Neutrophils: 59 %
Platelets: 149 10*3/uL — ABNORMAL LOW (ref 150–450)
RBC: 4.93 x10E6/uL (ref 4.14–5.80)
RDW: 15.9 % — ABNORMAL HIGH (ref 11.6–15.4)
WBC: 5.6 10*3/uL (ref 3.4–10.8)

## 2018-12-10 NOTE — Telephone Encounter (Signed)
-----   Message from Argentina Donovan, [place] sent at 12/10/2018  9:26 AM EST ----- Your alkaline phosphatase is elevated and may require further testing-make an appointment with Dr Margarita Rana for about 3 weeks from now.  Kidney function is poor-continue f/up with kidney doctors/dialysis.  Thanks, Freeman Caldron, PA-C

## 2018-12-10 NOTE — Telephone Encounter (Signed)
Patient was called and informed of lab results and to follow up with PCP in 3 weeks. Patient has appointment scheduled for 01/06/2019

## 2018-12-10 NOTE — Telephone Encounter (Signed)
Patient was called and a voicemail was left informing patient that letter is ready for pick up.

## 2018-12-10 NOTE — Telephone Encounter (Signed)
Letter is ready for pick-up.

## 2018-12-11 DIAGNOSIS — E876 Hypokalemia: Secondary | ICD-10-CM | POA: Diagnosis not present

## 2018-12-11 DIAGNOSIS — N186 End stage renal disease: Secondary | ICD-10-CM | POA: Diagnosis not present

## 2018-12-11 DIAGNOSIS — E119 Type 2 diabetes mellitus without complications: Secondary | ICD-10-CM | POA: Diagnosis not present

## 2018-12-11 DIAGNOSIS — N2581 Secondary hyperparathyroidism of renal origin: Secondary | ICD-10-CM | POA: Diagnosis not present

## 2018-12-11 DIAGNOSIS — Z992 Dependence on renal dialysis: Secondary | ICD-10-CM | POA: Diagnosis not present

## 2018-12-11 DIAGNOSIS — D631 Anemia in chronic kidney disease: Secondary | ICD-10-CM | POA: Diagnosis not present

## 2018-12-12 ENCOUNTER — Other Ambulatory Visit: Payer: Self-pay | Admitting: Internal Medicine

## 2018-12-13 DIAGNOSIS — E119 Type 2 diabetes mellitus without complications: Secondary | ICD-10-CM | POA: Diagnosis not present

## 2018-12-13 DIAGNOSIS — N2581 Secondary hyperparathyroidism of renal origin: Secondary | ICD-10-CM | POA: Diagnosis not present

## 2018-12-13 DIAGNOSIS — I482 Chronic atrial fibrillation, unspecified: Secondary | ICD-10-CM | POA: Diagnosis not present

## 2018-12-13 DIAGNOSIS — E876 Hypokalemia: Secondary | ICD-10-CM | POA: Diagnosis not present

## 2018-12-13 DIAGNOSIS — Z992 Dependence on renal dialysis: Secondary | ICD-10-CM | POA: Diagnosis not present

## 2018-12-13 DIAGNOSIS — D631 Anemia in chronic kidney disease: Secondary | ICD-10-CM | POA: Diagnosis not present

## 2018-12-13 DIAGNOSIS — N186 End stage renal disease: Secondary | ICD-10-CM | POA: Diagnosis not present

## 2018-12-14 ENCOUNTER — Ambulatory Visit (INDEPENDENT_AMBULATORY_CARE_PROVIDER_SITE_OTHER): Payer: Medicare Other

## 2018-12-14 DIAGNOSIS — I059 Rheumatic mitral valve disease, unspecified: Secondary | ICD-10-CM | POA: Diagnosis not present

## 2018-12-14 DIAGNOSIS — Z5181 Encounter for therapeutic drug level monitoring: Secondary | ICD-10-CM | POA: Diagnosis not present

## 2018-12-14 DIAGNOSIS — I079 Rheumatic tricuspid valve disease, unspecified: Secondary | ICD-10-CM

## 2018-12-14 LAB — POCT INR: INR: 2.8 (ref 2.0–3.0)

## 2018-12-14 NOTE — Patient Instructions (Signed)
Please continue taking 1 tablet everyday.  Recheck in 3 weeks. Coumadin Clinic [phone] Pt's new number is [phone]

## 2018-12-15 DIAGNOSIS — E119 Type 2 diabetes mellitus without complications: Secondary | ICD-10-CM | POA: Diagnosis not present

## 2018-12-15 DIAGNOSIS — Z992 Dependence on renal dialysis: Secondary | ICD-10-CM | POA: Diagnosis not present

## 2018-12-15 DIAGNOSIS — E876 Hypokalemia: Secondary | ICD-10-CM | POA: Diagnosis not present

## 2018-12-15 DIAGNOSIS — N186 End stage renal disease: Secondary | ICD-10-CM | POA: Diagnosis not present

## 2018-12-15 DIAGNOSIS — N2581 Secondary hyperparathyroidism of renal origin: Secondary | ICD-10-CM | POA: Diagnosis not present

## 2018-12-15 DIAGNOSIS — D631 Anemia in chronic kidney disease: Secondary | ICD-10-CM | POA: Diagnosis not present

## 2018-12-17 DIAGNOSIS — D631 Anemia in chronic kidney disease: Secondary | ICD-10-CM | POA: Diagnosis not present

## 2018-12-17 DIAGNOSIS — Z992 Dependence on renal dialysis: Secondary | ICD-10-CM | POA: Diagnosis not present

## 2018-12-17 DIAGNOSIS — E119 Type 2 diabetes mellitus without complications: Secondary | ICD-10-CM | POA: Diagnosis not present

## 2018-12-17 DIAGNOSIS — E876 Hypokalemia: Secondary | ICD-10-CM | POA: Diagnosis not present

## 2018-12-17 DIAGNOSIS — N2581 Secondary hyperparathyroidism of renal origin: Secondary | ICD-10-CM | POA: Diagnosis not present

## 2018-12-17 DIAGNOSIS — N186 End stage renal disease: Secondary | ICD-10-CM | POA: Diagnosis not present

## 2018-12-18 DIAGNOSIS — E119 Type 2 diabetes mellitus without complications: Secondary | ICD-10-CM | POA: Diagnosis not present

## 2018-12-18 DIAGNOSIS — Z992 Dependence on renal dialysis: Secondary | ICD-10-CM | POA: Diagnosis not present

## 2018-12-18 DIAGNOSIS — N186 End stage renal disease: Secondary | ICD-10-CM | POA: Diagnosis not present

## 2018-12-18 DIAGNOSIS — I132 Hypertensive heart and chronic kidney disease with heart failure and with stage 5 chronic kidney disease, or end stage renal disease: Secondary | ICD-10-CM | POA: Diagnosis not present

## 2018-12-18 DIAGNOSIS — D631 Anemia in chronic kidney disease: Secondary | ICD-10-CM | POA: Diagnosis not present

## 2018-12-18 DIAGNOSIS — N2581 Secondary hyperparathyroidism of renal origin: Secondary | ICD-10-CM | POA: Diagnosis not present

## 2018-12-18 DIAGNOSIS — I5022 Chronic systolic (congestive) heart failure: Secondary | ICD-10-CM | POA: Diagnosis not present

## 2018-12-18 DIAGNOSIS — I509 Heart failure, unspecified: Secondary | ICD-10-CM | POA: Diagnosis not present

## 2018-12-18 DIAGNOSIS — E876 Hypokalemia: Secondary | ICD-10-CM | POA: Diagnosis not present

## 2018-12-18 DIAGNOSIS — I50810 Right heart failure, unspecified: Secondary | ICD-10-CM | POA: Diagnosis not present

## 2018-12-20 DIAGNOSIS — E119 Type 2 diabetes mellitus without complications: Secondary | ICD-10-CM | POA: Diagnosis not present

## 2018-12-20 DIAGNOSIS — D631 Anemia in chronic kidney disease: Secondary | ICD-10-CM | POA: Diagnosis not present

## 2018-12-20 DIAGNOSIS — N2581 Secondary hyperparathyroidism of renal origin: Secondary | ICD-10-CM | POA: Diagnosis not present

## 2018-12-20 DIAGNOSIS — E876 Hypokalemia: Secondary | ICD-10-CM | POA: Diagnosis not present

## 2018-12-20 DIAGNOSIS — N186 End stage renal disease: Secondary | ICD-10-CM | POA: Diagnosis not present

## 2018-12-20 DIAGNOSIS — Z992 Dependence on renal dialysis: Secondary | ICD-10-CM | POA: Diagnosis not present

## 2018-12-20 DIAGNOSIS — I482 Chronic atrial fibrillation, unspecified: Secondary | ICD-10-CM | POA: Diagnosis not present

## 2018-12-21 ENCOUNTER — Ambulatory Visit (INDEPENDENT_AMBULATORY_CARE_PROVIDER_SITE_OTHER): Payer: Medicare Other | Admitting: Family Medicine

## 2018-12-21 ENCOUNTER — Encounter (INDEPENDENT_AMBULATORY_CARE_PROVIDER_SITE_OTHER): Payer: Self-pay | Admitting: Family Medicine

## 2018-12-21 ENCOUNTER — Ambulatory Visit: Payer: Medicare Other | Admitting: Family Medicine

## 2018-12-21 DIAGNOSIS — M545 Low back pain, unspecified: Secondary | ICD-10-CM

## 2018-12-21 DIAGNOSIS — G8929 Other chronic pain: Secondary | ICD-10-CM

## 2018-12-21 DIAGNOSIS — M79601 Pain in right arm: Secondary | ICD-10-CM | POA: Diagnosis not present

## 2018-12-21 MED ORDER — BACLOFEN 10 MG PO TABS: 5.0000 mg | ORAL_TABLET | Freq: Three times a day (TID) | ORAL | 3 refills | Status: DC | PRN

## 2018-12-21 NOTE — Progress Notes (Signed)
  Michael [name] - 58 y.o. male MRN [id number]  Date of birth: 08-Jun-1961    SUBJECTIVE:      Chief Complaint: Low back pain  HPI:  58 year old dialysis dependent male presents with about 1 month of low back pain.  He denies associated injury.  Localizes pain to the lower mid back near the sacrum.  He denies any radiation of his pain.  He denies any specific exacerbating factors but does note it worsens with prolonged sitting or generally with movement.  He gets some relief with tramadol but this makes him drowsy.  He denies any pain rating into the legs.  No distal radiculopathy.  He does note occasional feeling of numbness across the tips of his toes of both feet.  He does have a history of low back pain previously for which x-rays were obtained in 2018.  He is not previously done physical therapy   ROS:     See HPI  PERTINENT  PMH / PSH FH / / SH:  Past Medical, Surgical, Social, and Family History Reviewed & Updated in the EMR.  Pertinent findings include:  Dialysis dependent ESRD  OBJECTIVE: There were no vitals taken for this visit.  Physical Exam:  Vital signs are reviewed.  GEN: Alert and oriented, NAD Pulm: Breathing unlabored PSY: normal mood, congruent affect  MSK: Lumbar spine: - Inspection: no obvious deformity.  No skin changes - Palpation: Somewhat diffusely tender over the lumbosacral region and bilateral SI joints - ROM: limited ROM in flexion/extension - Strength: 4+/5 strength of b/l LE globally - Neuro: sensation intact in the L4-S1 nerve root distribution b/l, 2+ L4 and S1 reflexes - Special testing: Negative straight leg raise  Bilateral hips:  no ttp over greater trochanter b/l No pain with IR/ER, normal ROM    ASSESSMENT & PLAN:  1. Low back pain - pt does have degenerative changes in his lumbar spine as seen on previous x-ray.  No radiculopathy or evidence of neurologic involvement. -Continue tramadol -Baclofen - We will refer to physical  therapy -Follow-up 6 weeks as needed

## 2018-12-21 NOTE — Progress Notes (Signed)
I saw and examined the patient with Dr. Okey Dupre and agree with assessment and plan as outlined.  New onset right arm biceps tendinopathy and chronic mechanical LBP.  Will try PT.  MRI lumbar spine +/- epidural injections if not improving.

## 2018-12-22 DIAGNOSIS — D631 Anemia in chronic kidney disease: Secondary | ICD-10-CM | POA: Diagnosis not present

## 2018-12-22 DIAGNOSIS — I482 Chronic atrial fibrillation, unspecified: Secondary | ICD-10-CM | POA: Diagnosis not present

## 2018-12-22 DIAGNOSIS — N186 End stage renal disease: Secondary | ICD-10-CM | POA: Diagnosis not present

## 2018-12-22 DIAGNOSIS — N2581 Secondary hyperparathyroidism of renal origin: Secondary | ICD-10-CM | POA: Diagnosis not present

## 2018-12-22 DIAGNOSIS — E876 Hypokalemia: Secondary | ICD-10-CM | POA: Diagnosis not present

## 2018-12-22 DIAGNOSIS — E119 Type 2 diabetes mellitus without complications: Secondary | ICD-10-CM | POA: Diagnosis not present

## 2018-12-22 DIAGNOSIS — Z992 Dependence on renal dialysis: Secondary | ICD-10-CM | POA: Diagnosis not present

## 2018-12-24 DIAGNOSIS — N2581 Secondary hyperparathyroidism of renal origin: Secondary | ICD-10-CM | POA: Diagnosis not present

## 2018-12-24 DIAGNOSIS — E119 Type 2 diabetes mellitus without complications: Secondary | ICD-10-CM | POA: Diagnosis not present

## 2018-12-24 DIAGNOSIS — N186 End stage renal disease: Secondary | ICD-10-CM | POA: Diagnosis not present

## 2018-12-24 DIAGNOSIS — E876 Hypokalemia: Secondary | ICD-10-CM | POA: Diagnosis not present

## 2018-12-24 DIAGNOSIS — Z992 Dependence on renal dialysis: Secondary | ICD-10-CM | POA: Diagnosis not present

## 2018-12-24 DIAGNOSIS — D631 Anemia in chronic kidney disease: Secondary | ICD-10-CM | POA: Diagnosis not present

## 2018-12-25 DIAGNOSIS — D631 Anemia in chronic kidney disease: Secondary | ICD-10-CM | POA: Diagnosis not present

## 2018-12-25 DIAGNOSIS — E876 Hypokalemia: Secondary | ICD-10-CM | POA: Diagnosis not present

## 2018-12-25 DIAGNOSIS — Z992 Dependence on renal dialysis: Secondary | ICD-10-CM | POA: Diagnosis not present

## 2018-12-25 DIAGNOSIS — E119 Type 2 diabetes mellitus without complications: Secondary | ICD-10-CM | POA: Diagnosis not present

## 2018-12-25 DIAGNOSIS — N186 End stage renal disease: Secondary | ICD-10-CM | POA: Diagnosis not present

## 2018-12-25 DIAGNOSIS — N2581 Secondary hyperparathyroidism of renal origin: Secondary | ICD-10-CM | POA: Diagnosis not present

## 2018-12-27 DIAGNOSIS — E876 Hypokalemia: Secondary | ICD-10-CM | POA: Diagnosis not present

## 2018-12-27 DIAGNOSIS — N2581 Secondary hyperparathyroidism of renal origin: Secondary | ICD-10-CM | POA: Diagnosis not present

## 2018-12-27 DIAGNOSIS — E119 Type 2 diabetes mellitus without complications: Secondary | ICD-10-CM | POA: Diagnosis not present

## 2018-12-27 DIAGNOSIS — Z992 Dependence on renal dialysis: Secondary | ICD-10-CM | POA: Diagnosis not present

## 2018-12-27 DIAGNOSIS — D631 Anemia in chronic kidney disease: Secondary | ICD-10-CM | POA: Diagnosis not present

## 2018-12-27 DIAGNOSIS — N186 End stage renal disease: Secondary | ICD-10-CM | POA: Diagnosis not present

## 2018-12-27 DIAGNOSIS — I482 Chronic atrial fibrillation, unspecified: Secondary | ICD-10-CM | POA: Diagnosis not present

## 2018-12-29 DIAGNOSIS — N186 End stage renal disease: Secondary | ICD-10-CM | POA: Diagnosis not present

## 2018-12-29 DIAGNOSIS — D631 Anemia in chronic kidney disease: Secondary | ICD-10-CM | POA: Diagnosis not present

## 2018-12-29 DIAGNOSIS — E119 Type 2 diabetes mellitus without complications: Secondary | ICD-10-CM | POA: Diagnosis not present

## 2018-12-29 DIAGNOSIS — E876 Hypokalemia: Secondary | ICD-10-CM | POA: Diagnosis not present

## 2018-12-29 DIAGNOSIS — Z992 Dependence on renal dialysis: Secondary | ICD-10-CM | POA: Diagnosis not present

## 2018-12-29 DIAGNOSIS — N2581 Secondary hyperparathyroidism of renal origin: Secondary | ICD-10-CM | POA: Diagnosis not present

## 2018-12-30 ENCOUNTER — Ambulatory Visit: Payer: Medicare Other | Admitting: Internal Medicine

## 2018-12-31 DIAGNOSIS — D631 Anemia in chronic kidney disease: Secondary | ICD-10-CM | POA: Diagnosis not present

## 2018-12-31 DIAGNOSIS — E876 Hypokalemia: Secondary | ICD-10-CM | POA: Diagnosis not present

## 2018-12-31 DIAGNOSIS — N186 End stage renal disease: Secondary | ICD-10-CM | POA: Diagnosis not present

## 2018-12-31 DIAGNOSIS — Z992 Dependence on renal dialysis: Secondary | ICD-10-CM | POA: Diagnosis not present

## 2018-12-31 DIAGNOSIS — N2581 Secondary hyperparathyroidism of renal origin: Secondary | ICD-10-CM | POA: Diagnosis not present

## 2018-12-31 DIAGNOSIS — E119 Type 2 diabetes mellitus without complications: Secondary | ICD-10-CM | POA: Diagnosis not present

## 2019-01-01 DIAGNOSIS — D631 Anemia in chronic kidney disease: Secondary | ICD-10-CM | POA: Diagnosis not present

## 2019-01-01 DIAGNOSIS — N2581 Secondary hyperparathyroidism of renal origin: Secondary | ICD-10-CM | POA: Diagnosis not present

## 2019-01-01 DIAGNOSIS — N186 End stage renal disease: Secondary | ICD-10-CM | POA: Diagnosis not present

## 2019-01-01 DIAGNOSIS — E876 Hypokalemia: Secondary | ICD-10-CM | POA: Diagnosis not present

## 2019-01-01 DIAGNOSIS — E119 Type 2 diabetes mellitus without complications: Secondary | ICD-10-CM | POA: Diagnosis not present

## 2019-01-01 DIAGNOSIS — Z992 Dependence on renal dialysis: Secondary | ICD-10-CM | POA: Diagnosis not present

## 2019-01-03 DIAGNOSIS — Z992 Dependence on renal dialysis: Secondary | ICD-10-CM | POA: Diagnosis not present

## 2019-01-03 DIAGNOSIS — E876 Hypokalemia: Secondary | ICD-10-CM | POA: Diagnosis not present

## 2019-01-03 DIAGNOSIS — N2581 Secondary hyperparathyroidism of renal origin: Secondary | ICD-10-CM | POA: Diagnosis not present

## 2019-01-03 DIAGNOSIS — D631 Anemia in chronic kidney disease: Secondary | ICD-10-CM | POA: Diagnosis not present

## 2019-01-03 DIAGNOSIS — I482 Chronic atrial fibrillation, unspecified: Secondary | ICD-10-CM | POA: Diagnosis not present

## 2019-01-03 DIAGNOSIS — E119 Type 2 diabetes mellitus without complications: Secondary | ICD-10-CM | POA: Diagnosis not present

## 2019-01-03 DIAGNOSIS — N186 End stage renal disease: Secondary | ICD-10-CM | POA: Diagnosis not present

## 2019-01-04 ENCOUNTER — Ambulatory Visit (INDEPENDENT_AMBULATORY_CARE_PROVIDER_SITE_OTHER): Payer: Medicare Other | Admitting: Pharmacist

## 2019-01-04 DIAGNOSIS — Z5181 Encounter for therapeutic drug level monitoring: Secondary | ICD-10-CM

## 2019-01-04 DIAGNOSIS — I079 Rheumatic tricuspid valve disease, unspecified: Secondary | ICD-10-CM

## 2019-01-04 DIAGNOSIS — I059 Rheumatic mitral valve disease, unspecified: Secondary | ICD-10-CM | POA: Diagnosis not present

## 2019-01-04 LAB — POCT INR: INR: 3.3 — AB (ref 2.0–3.0)

## 2019-01-04 NOTE — Patient Instructions (Signed)
Description   Do not take warfarin today, then continue taking 1 tablet everyday.  Recheck in 3 weeks. Coumadin Clinic [phone] Pt's new number is [phone]

## 2019-01-05 DIAGNOSIS — E119 Type 2 diabetes mellitus without complications: Secondary | ICD-10-CM | POA: Diagnosis not present

## 2019-01-05 DIAGNOSIS — E876 Hypokalemia: Secondary | ICD-10-CM | POA: Diagnosis not present

## 2019-01-05 DIAGNOSIS — N2581 Secondary hyperparathyroidism of renal origin: Secondary | ICD-10-CM | POA: Diagnosis not present

## 2019-01-05 DIAGNOSIS — D631 Anemia in chronic kidney disease: Secondary | ICD-10-CM | POA: Diagnosis not present

## 2019-01-05 DIAGNOSIS — N186 End stage renal disease: Secondary | ICD-10-CM | POA: Diagnosis not present

## 2019-01-05 DIAGNOSIS — Z992 Dependence on renal dialysis: Secondary | ICD-10-CM | POA: Diagnosis not present

## 2019-01-06 ENCOUNTER — Ambulatory Visit: Payer: Medicare Other | Attending: Family Medicine | Admitting: Family Medicine

## 2019-01-06 ENCOUNTER — Encounter: Payer: Self-pay | Admitting: Family Medicine

## 2019-01-06 DIAGNOSIS — M545 Low back pain, unspecified: Secondary | ICD-10-CM

## 2019-01-06 DIAGNOSIS — G4709 Other insomnia: Secondary | ICD-10-CM

## 2019-01-06 DIAGNOSIS — N186 End stage renal disease: Secondary | ICD-10-CM | POA: Diagnosis not present

## 2019-01-06 DIAGNOSIS — Z992 Dependence on renal dialysis: Secondary | ICD-10-CM | POA: Diagnosis not present

## 2019-01-06 DIAGNOSIS — G8929 Other chronic pain: Secondary | ICD-10-CM

## 2019-01-06 DIAGNOSIS — I771 Stricture of artery: Secondary | ICD-10-CM | POA: Diagnosis not present

## 2019-01-06 DIAGNOSIS — T82858A Stenosis of vascular prosthetic devices, implants and grafts, initial encounter: Secondary | ICD-10-CM | POA: Diagnosis not present

## 2019-01-06 MED ORDER — TRAZODONE HCL 100 MG PO TABS: 100.0000 mg | ORAL_TABLET | Freq: Every day | ORAL | 3 refills | Status: DC

## 2019-01-06 MED ORDER — DULOXETINE HCL 60 MG PO CPEP: 60.0000 mg | ORAL_CAPSULE | Freq: Every day | ORAL | 3 refills | Status: DC

## 2019-01-06 NOTE — Progress Notes (Signed)
Subjective:  Patient ID: Michael [name], male    DOB: [birth year]  Age: 58 y.o. MRN: [id number]  CC: Back Pain and Hypertension   HPI Garlan Vora is a 58 year old male with a history of Type 2DM (A1c 5.7), hypertension, hyperlipidemia, mitral valve replacement with porcine valve, tricuspid valve repair, atrial fibrillation (on anticoagulation with Coumadin and rate control with metoprolol),Chronic systolic heartfailure  (EF 50-55% from 11/2017), Cirrhosis, ESRD who presents today for follow-up of his low back pain. He has been referred to orthopedics at his last visit with the PA and saw Dr. Junius Roads on 12/21/2018 at which time he was placed on baclofen and referred to PT. He is yet to have an appointment with PT. Low back pain is moderate to severe and does not radiate down his lower extremities, is uncontrolled on tramadol. He previously had right arm pain which has resolved. He does have worsening insomnia which he attributes to his pain as well. Denies loss of sphincteric function, recent falls.  Past Medical History:  Diagnosis Date  . Anemia of chronic disease   . Asthma    said he does not know  . Chronic kidney disease (CKD), stage IV (severe) ([place])   . Chronic systolic CHF (congestive heart failure) (HCC)    a. prior EF normal; b. Echo 10/16: Mild LVH, EF 30-35%, mitral valve bioprosthesis present without evidence of stenosis or regurgitation, severe LAE, mild RVE with mild to moderately reduced RVSF, moderate RAE  . Cirrhosis ([place])   . Coronary artery disease   . Diabetes mellitus type 2 in obese ([place])   . Diabetes mellitus without complication ([place])   . Dyspnea   . GERD (gastroesophageal reflux disease)   . H/O tricuspid valve repair 2012  . History of echocardiogram    Echo at [facility] in [facility] 4/12: mod LVH, EF 60-65%, mod LAE, tissue MVR ok, mod TR, mod pulmo HTN with RVSP 48 mmHg  . Hypertension   . Obstructive sleep apnea   . Paroxysmal atrial fibrillation  (HCC)    s/p Maze procedure at time of MVR in 2011  . Pulmonary HTN ([place])   . 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 Thrombocytopenia ([place])     Past Surgical History:  Procedure Laterality Date  . AV FISTULA PLACEMENT Left 07/13/2017   Procedure: LEFT ARM ARTERIOVENOUS (AV) FISTULA CREATION;  Surgeon: Waynetta Sandy, MD;  Location: [place];  Service: Vascular;  Laterality: Left;  . BASCILIC VEIN TRANSPOSITION Left 11/03/2017   Procedure: BASILIC VEIN TRANSPOSITION SECOND STAGE;  Surgeon: Waynetta Sandy, MD;  Location: [place];  Service: Vascular;  Laterality: Left;  . CARDIAC SURGERY    . CARDIAC VALVE REPLACEMENT  2011  . CARDIOVERSION N/A 07/09/2017   Procedure: CARDIOVERSION;  Surgeon: Larey Dresser, MD;  Location: [facility] ENDOSCOPY;  Service: Cardiovascular;  Laterality: N/A;  . COLONOSCOPY N/A 07/16/2017   Procedure: COLONOSCOPY;  Surgeon: Jerene Bears, MD;  Location: [place];  Service: Gastroenterology;  Laterality: N/A;  . ESOPHAGOGASTRODUODENOSCOPY N/A 07/16/2017   Procedure: ESOPHAGOGASTRODUODENOSCOPY (EGD);  Surgeon: Jerene Bears, MD;  Location: [facility] ENDOSCOPY;  Service: Gastroenterology;  Laterality: N/A;  . INSERTION OF DIALYSIS CATHETER Right 07/13/2017   Procedure: INSERTION OF DIALYSIS CATHETER RIGHT INTERNAL JUGULAR;  Surgeon: Waynetta Sandy, MD;  Location: [place];  Service: Vascular;  Laterality: Right;  . RIGHT HEART CATH N/A 06/29/2017   Procedure: RIGHT HEART CATH;  Surgeon: Larey Dresser, MD;  Location:  [place] INVASIVE CV LAB;  Service: Cardiovascular;  Laterality: N/A;  . RIGHT/LEFT HEART CATH AND CORONARY ANGIOGRAPHY N/A 08/19/2017   Procedure: RIGHT/LEFT HEART CATH AND CORONARY ANGIOGRAPHY;  Surgeon: Leonie Man, MD;  Location: [place] CV LAB;  Service: Cardiovascular;  Laterality: N/A;  . TEE WITHOUT CARDIOVERSION N/A 07/09/2017   Procedure: TRANSESOPHAGEAL ECHOCARDIOGRAM (TEE);  Surgeon: Larey Dresser, MD;   Location: [facility] ENDOSCOPY;  Service: Cardiovascular;  Laterality: N/A;    Family History  Problem Relation Age of Onset  . Heart attack Neg Hx     Allergies  Allergen Reactions  . Triamcinolone Other (See Comments)    Felt like skin was burning    Outpatient Medications Prior to Visit  Medication Sig Dispense Refill  . amiodarone (PACERONE) 200 MG tablet TAKE 1 TABLET BY MOUTH TWICE A DAY 60 tablet 10  . atorvastatin (LIPITOR) 20 MG tablet TAKE 1 TABLET (20 MG TOTAL) BY MOUTH DAILY. PLEASE KEEP UPCOMING APPT. THANK YOU 90 tablet 1  . B Complex-C-Zn-Folic Acid (DIALYVITE/ZINC) TABS Take 1 tablet by mouth daily.    . baclofen (LIORESAL) 10 MG tablet Take 0.5 tablets (5 mg total) by mouth 3 (three) times daily as needed for muscle spasms. 30 each 3  . calcium acetate (PHOSLO) 667 MG capsule Take 1 capsule (667 mg total) by mouth 3 (three) times daily with meals. Please keep upcoming appt. Thank You 90 capsule 0  . cetirizine (ZYRTEC) 10 MG tablet Take 1 tablet (10 mg total) by mouth daily. 30 tablet 11  . colchicine 0.6 MG tablet Take 0.5 tablets (0.3 mg total) by mouth 2 (two) times a week. 30 tablet 0  . diazepam (VALIUM) 5 MG tablet Take 5 mg by mouth at bedtime as needed (sleep).    . diphenhydramine-acetaminophen (TYLENOL PM) 25-500 MG TABS tablet Take 1 tablet by mouth at bedtime as needed (pain).    . fluticasone (FLONASE) 50 MCG/ACT nasal spray Place 2 sprays into both nostrils daily. 16 g 0  . folic acid (FOLVITE) 1 MG tablet TAKE 1 TABLET BY MOUTH EVERY DAY 90 tablet 0  . gabapentin (NEURONTIN) 100 MG capsule Take 200 mg by mouth at bedtime. FOR LEG PAIN    . lidocaine (XYLOCAINE) 2 % solution Swish and spit 15 ml Q 6 hrs PRN 200 mL 0  . metoprolol tartrate (LOPRESSOR) 25 MG tablet Take 1 tablet (25 mg total) by mouth 2 (two) times daily. 180 tablet 3  . midodrine (PROAMATINE) 10 MG tablet     . multivitamin (RENA-VIT) TABS tablet Take 1 tablet by mouth at bedtime. 30 tablet 0  .  ondansetron (ZOFRAN-ODT) 4 MG disintegrating tablet Take 1 tablet (4 mg total) by mouth every 8 (eight) hours as needed for nausea or vomiting. 20 tablet 0  . OXYGEN Inhale 2 L into the lungs daily as needed (shortness of breath).     . traMADol (ULTRAM) 50 MG tablet Take 1 tablet (50 mg total) by mouth every 8 (eight) hours as needed. 30 tablet 0  . warfarin (COUMADIN) 2.5 MG tablet TAKE AS DIRECTED BY COUMADIN CLINIC 35 tablet 0  . tiZANidine (ZANAFLEX) 4 MG tablet TAKE 1 TABLET (4 MG TOTAL) BY MOUTH EVERY 8 (EIGHT) HOURS AS NEEDED FOR MUSCLE SPASMS. 60 tablet 1  . traZODone (DESYREL) 50 MG tablet TAKE 1 TABLET BY MOUTH AT BEDTIME AS NEEDED FOR SLEEP 90 tablet 0   No facility-administered medications prior to visit.      ROS Review  of Systems General: negative for fever, weight loss, appetite change Eyes: no visual symptoms. ENT: no ear symptoms, no sinus tenderness, no nasal congestion or sore throat. Neck: no pain  Respiratory: no wheezing, shortness of breath, cough Cardiovascular: no chest pain, no dyspnea on exertion, no pedal edema, no orthopnea. Gastrointestinal: no abdominal pain, no diarrhea, no constipation Genito-Urinary: no urinary frequency, no dysuria, no polyuria. Hematologic: no bruising Endocrine: no cold or heat intolerance Neurological: no headaches, no seizures, no tremors Musculoskeletal: no joint pains, no joint swelling, + back pain Skin: no pruritus, no rash. Psychological: no depression, no anxiety,    Objective:  BP (!) 91/49   Pulse 73   Temp 97.6 F (36.4 C) (Oral)   Ht 5\' 5"  (1.651 m)   Wt 147 lb (66.7 kg)   SpO2 99%   BMI 24.46 kg/m   BP/Weight 01/06/2019 12/09/2018 40/98/1191  Systolic BP 91 90 478  Diastolic BP 49 53 68  Wt. (Lbs) 147 146.4 141  BMI 24.46 24.36 23.46      Physical Exam Constitutional:      Appearance: He is well-developed.  Cardiovascular:     Rate and Rhythm: Normal rate.     Heart sounds: Normal heart sounds. No  murmur.  Pulmonary:     Effort: Pulmonary effort is normal.     Breath sounds: Normal breath sounds. No wheezing or rales.  Chest:     Chest wall: No tenderness.  Abdominal:     General: Bowel sounds are normal. There is no distension.     Palpations: Abdomen is soft. There is no mass.     Tenderness: There is no abdominal tenderness.  Musculoskeletal: Normal range of motion.     Comments: Lumbar spine tenderness, negative straight leg raise  Neurological:     Mental Status: He is alert and oriented to person, place, and time.     CMP Latest Ref Rng & Units 12/09/2018 09/13/2018 08/12/2018  Glucose 65 - 99 mg/dL 81 86 103(H)  BUN 6 - 24 mg/dL 28(H) 26(H) 22(H)  Creatinine 0.76 - 1.27 mg/dL 5.54(H) 3.60(H) 3.97(H)  Sodium 134 - 144 mmol/L 138 141 136  Potassium 3.5 - 5.2 mmol/L 4.2 3.7 3.7  Chloride 96 - 106 mmol/L 92(L) 100 98  CO2 20 - 29 mmol/L 26 - 26  Calcium 8.7 - 10.2 mg/dL 9.3 - 8.5(L)  Total Protein 6.0 - 8.5 g/dL 8.5 - -  Total Bilirubin 0.0 - 1.2 mg/dL 1.4(H) - -  Alkaline Phos 39 - 117 IU/L 239(H) - -  AST 0 - 40 IU/L 43(H) - -  ALT 0 - 44 IU/L 29 - -    Lipid Panel     Component Value Date/Time   CHOL 85 11/24/2017 0521   CHOL 111 03/03/2017 1152   TRIG 76 11/24/2017 0521   HDL 28 (L) 11/24/2017 0521   HDL 36 (L) 03/03/2017 1152   CHOLHDL 3.0 11/24/2017 0521   VLDL 15 11/24/2017 0521   LDLCALC 42 11/24/2017 0521   LDLCALC 62 03/03/2017 1152    CBC    Component Value Date/Time   WBC 5.6 12/09/2018 1020   WBC 3.5 (L) 09/13/2018 2026   RBC 4.93 12/09/2018 1020   RBC 3.63 (L) 09/13/2018 2026   HGB 12.5 (L) 12/09/2018 1020   HCT 38.1 12/09/2018 1020   PLT 149 (L) 12/09/2018 1020   MCV 77 (L) 12/09/2018 1020   MCH 25.4 (L) 12/09/2018 1020   MCH 26.7 09/13/2018 2026  MCHC 32.8 12/09/2018 1020   MCHC 29.8 (L) 09/13/2018 2026   RDW 15.9 (H) 12/09/2018 1020   LYMPHSABS 1.1 12/09/2018 1020   MONOABS 0.5 09/13/2018 2026   EOSABS 0.2 12/09/2018 1020    BASOSABS 0.1 12/09/2018 1020    Lab Results  Component Value Date   HGBA1C 5.7 11/02/2018    Assessment & Plan:   1. Other insomnia Uncontrolled Increase trazodone - traZODone (DESYREL) 100 MG tablet; Take 1 tablet (100 mg total) by mouth at bedtime. for sleep  Dispense: 30 tablet; Refill: 3  2. Chronic midline low back pain without sciatica Uncontrolled Cymbalta added to regimen Currently being seen by orthopedic and referred for physical therapy - DULoxetine (CYMBALTA) 60 MG capsule; Take 1 capsule (60 mg total) by mouth daily. For back pain  Dispense: 30 capsule; Refill: 3   Meds ordered this encounter  Medications  . DULoxetine (CYMBALTA) 60 MG capsule    Sig: Take 1 capsule (60 mg total) by mouth daily. For back pain    Dispense:  30 capsule    Refill:  3  . traZODone (DESYREL) 100 MG tablet    Sig: Take 1 tablet (100 mg total) by mouth at bedtime. for sleep    Dispense:  30 tablet    Refill:  3    Follow-up: Return for follow up of chronic medical conditions, keep previously scheduled appointment.       Charlott Rakes, MD, FAAFP. [facility] and [facility], [place]   01/06/2019, 10:28 AM

## 2019-01-06 NOTE — Patient Instructions (Signed)

## 2019-01-07 DIAGNOSIS — E876 Hypokalemia: Secondary | ICD-10-CM | POA: Diagnosis not present

## 2019-01-07 DIAGNOSIS — E119 Type 2 diabetes mellitus without complications: Secondary | ICD-10-CM | POA: Diagnosis not present

## 2019-01-07 DIAGNOSIS — N2581 Secondary hyperparathyroidism of renal origin: Secondary | ICD-10-CM | POA: Diagnosis not present

## 2019-01-07 DIAGNOSIS — N186 End stage renal disease: Secondary | ICD-10-CM | POA: Diagnosis not present

## 2019-01-07 DIAGNOSIS — Z992 Dependence on renal dialysis: Secondary | ICD-10-CM | POA: Diagnosis not present

## 2019-01-07 DIAGNOSIS — D631 Anemia in chronic kidney disease: Secondary | ICD-10-CM | POA: Diagnosis not present

## 2019-01-08 DIAGNOSIS — N186 End stage renal disease: Secondary | ICD-10-CM | POA: Diagnosis not present

## 2019-01-08 DIAGNOSIS — E119 Type 2 diabetes mellitus without complications: Secondary | ICD-10-CM | POA: Diagnosis not present

## 2019-01-08 DIAGNOSIS — Z992 Dependence on renal dialysis: Secondary | ICD-10-CM | POA: Diagnosis not present

## 2019-01-08 DIAGNOSIS — E876 Hypokalemia: Secondary | ICD-10-CM | POA: Diagnosis not present

## 2019-01-08 DIAGNOSIS — N2581 Secondary hyperparathyroidism of renal origin: Secondary | ICD-10-CM | POA: Diagnosis not present

## 2019-01-08 DIAGNOSIS — D631 Anemia in chronic kidney disease: Secondary | ICD-10-CM | POA: Diagnosis not present

## 2019-01-10 DIAGNOSIS — N186 End stage renal disease: Secondary | ICD-10-CM | POA: Diagnosis not present

## 2019-01-10 DIAGNOSIS — E876 Hypokalemia: Secondary | ICD-10-CM | POA: Diagnosis not present

## 2019-01-10 DIAGNOSIS — E119 Type 2 diabetes mellitus without complications: Secondary | ICD-10-CM | POA: Diagnosis not present

## 2019-01-10 DIAGNOSIS — Z992 Dependence on renal dialysis: Secondary | ICD-10-CM | POA: Diagnosis not present

## 2019-01-10 DIAGNOSIS — N2581 Secondary hyperparathyroidism of renal origin: Secondary | ICD-10-CM | POA: Diagnosis not present

## 2019-01-10 DIAGNOSIS — D631 Anemia in chronic kidney disease: Secondary | ICD-10-CM | POA: Diagnosis not present

## 2019-01-10 DIAGNOSIS — I482 Chronic atrial fibrillation, unspecified: Secondary | ICD-10-CM | POA: Diagnosis not present

## 2019-01-12 DIAGNOSIS — E119 Type 2 diabetes mellitus without complications: Secondary | ICD-10-CM | POA: Diagnosis not present

## 2019-01-12 DIAGNOSIS — N2581 Secondary hyperparathyroidism of renal origin: Secondary | ICD-10-CM | POA: Diagnosis not present

## 2019-01-12 DIAGNOSIS — D631 Anemia in chronic kidney disease: Secondary | ICD-10-CM | POA: Diagnosis not present

## 2019-01-12 DIAGNOSIS — Z992 Dependence on renal dialysis: Secondary | ICD-10-CM | POA: Diagnosis not present

## 2019-01-12 DIAGNOSIS — E876 Hypokalemia: Secondary | ICD-10-CM | POA: Diagnosis not present

## 2019-01-12 DIAGNOSIS — N186 End stage renal disease: Secondary | ICD-10-CM | POA: Diagnosis not present

## 2019-01-13 ENCOUNTER — Ambulatory Visit: Payer: Medicare Other | Admitting: Physician Assistant

## 2019-01-13 DIAGNOSIS — R0989 Other specified symptoms and signs involving the circulatory and respiratory systems: Secondary | ICD-10-CM

## 2019-01-14 ENCOUNTER — Other Ambulatory Visit: Payer: Self-pay | Admitting: Internal Medicine

## 2019-01-14 DIAGNOSIS — D631 Anemia in chronic kidney disease: Secondary | ICD-10-CM | POA: Diagnosis not present

## 2019-01-14 DIAGNOSIS — Z992 Dependence on renal dialysis: Secondary | ICD-10-CM | POA: Diagnosis not present

## 2019-01-14 DIAGNOSIS — N2581 Secondary hyperparathyroidism of renal origin: Secondary | ICD-10-CM | POA: Diagnosis not present

## 2019-01-14 DIAGNOSIS — N186 End stage renal disease: Secondary | ICD-10-CM | POA: Diagnosis not present

## 2019-01-14 DIAGNOSIS — E119 Type 2 diabetes mellitus without complications: Secondary | ICD-10-CM | POA: Diagnosis not present

## 2019-01-14 DIAGNOSIS — E876 Hypokalemia: Secondary | ICD-10-CM | POA: Diagnosis not present

## 2019-01-15 DIAGNOSIS — D631 Anemia in chronic kidney disease: Secondary | ICD-10-CM | POA: Diagnosis not present

## 2019-01-15 DIAGNOSIS — Z992 Dependence on renal dialysis: Secondary | ICD-10-CM | POA: Diagnosis not present

## 2019-01-15 DIAGNOSIS — N186 End stage renal disease: Secondary | ICD-10-CM | POA: Diagnosis not present

## 2019-01-15 DIAGNOSIS — E119 Type 2 diabetes mellitus without complications: Secondary | ICD-10-CM | POA: Diagnosis not present

## 2019-01-15 DIAGNOSIS — N2581 Secondary hyperparathyroidism of renal origin: Secondary | ICD-10-CM | POA: Diagnosis not present

## 2019-01-15 DIAGNOSIS — E876 Hypokalemia: Secondary | ICD-10-CM | POA: Diagnosis not present

## 2019-01-16 ENCOUNTER — Other Ambulatory Visit: Payer: Self-pay | Admitting: Internal Medicine

## 2019-01-16 DIAGNOSIS — I509 Heart failure, unspecified: Secondary | ICD-10-CM | POA: Diagnosis not present

## 2019-01-16 DIAGNOSIS — N186 End stage renal disease: Secondary | ICD-10-CM | POA: Diagnosis not present

## 2019-01-16 DIAGNOSIS — Z992 Dependence on renal dialysis: Secondary | ICD-10-CM | POA: Diagnosis not present

## 2019-01-17 ENCOUNTER — Encounter: Payer: Self-pay | Admitting: Physician Assistant

## 2019-01-17 DIAGNOSIS — N186 End stage renal disease: Secondary | ICD-10-CM | POA: Diagnosis not present

## 2019-01-17 DIAGNOSIS — I5022 Chronic systolic (congestive) heart failure: Secondary | ICD-10-CM | POA: Diagnosis not present

## 2019-01-17 DIAGNOSIS — Z992 Dependence on renal dialysis: Secondary | ICD-10-CM | POA: Diagnosis not present

## 2019-01-17 DIAGNOSIS — I482 Chronic atrial fibrillation, unspecified: Secondary | ICD-10-CM | POA: Diagnosis not present

## 2019-01-17 DIAGNOSIS — E119 Type 2 diabetes mellitus without complications: Secondary | ICD-10-CM | POA: Diagnosis not present

## 2019-01-17 DIAGNOSIS — N2581 Secondary hyperparathyroidism of renal origin: Secondary | ICD-10-CM | POA: Diagnosis not present

## 2019-01-17 DIAGNOSIS — I132 Hypertensive heart and chronic kidney disease with heart failure and with stage 5 chronic kidney disease, or end stage renal disease: Secondary | ICD-10-CM | POA: Diagnosis not present

## 2019-01-17 DIAGNOSIS — D631 Anemia in chronic kidney disease: Secondary | ICD-10-CM | POA: Diagnosis not present

## 2019-01-17 DIAGNOSIS — I50810 Right heart failure, unspecified: Secondary | ICD-10-CM | POA: Diagnosis not present

## 2019-01-17 DIAGNOSIS — E876 Hypokalemia: Secondary | ICD-10-CM | POA: Diagnosis not present

## 2019-01-17 LAB — PROTIME-INR: INR: 1.5 — AB (ref 0.9–1.1)

## 2019-01-18 ENCOUNTER — Ambulatory Visit (INDEPENDENT_AMBULATORY_CARE_PROVIDER_SITE_OTHER): Payer: Self-pay | Admitting: Pharmacist

## 2019-01-18 DIAGNOSIS — I059 Rheumatic mitral valve disease, unspecified: Secondary | ICD-10-CM

## 2019-01-18 DIAGNOSIS — Z5181 Encounter for therapeutic drug level monitoring: Secondary | ICD-10-CM

## 2019-01-18 DIAGNOSIS — I079 Rheumatic tricuspid valve disease, unspecified: Secondary | ICD-10-CM

## 2019-01-19 DIAGNOSIS — E119 Type 2 diabetes mellitus without complications: Secondary | ICD-10-CM | POA: Diagnosis not present

## 2019-01-19 DIAGNOSIS — Z992 Dependence on renal dialysis: Secondary | ICD-10-CM | POA: Diagnosis not present

## 2019-01-19 DIAGNOSIS — E876 Hypokalemia: Secondary | ICD-10-CM | POA: Diagnosis not present

## 2019-01-19 DIAGNOSIS — N186 End stage renal disease: Secondary | ICD-10-CM | POA: Diagnosis not present

## 2019-01-19 DIAGNOSIS — N2581 Secondary hyperparathyroidism of renal origin: Secondary | ICD-10-CM | POA: Diagnosis not present

## 2019-01-19 DIAGNOSIS — D631 Anemia in chronic kidney disease: Secondary | ICD-10-CM | POA: Diagnosis not present

## 2019-01-21 DIAGNOSIS — E876 Hypokalemia: Secondary | ICD-10-CM | POA: Diagnosis not present

## 2019-01-21 DIAGNOSIS — Z992 Dependence on renal dialysis: Secondary | ICD-10-CM | POA: Diagnosis not present

## 2019-01-21 DIAGNOSIS — E119 Type 2 diabetes mellitus without complications: Secondary | ICD-10-CM | POA: Diagnosis not present

## 2019-01-21 DIAGNOSIS — N2581 Secondary hyperparathyroidism of renal origin: Secondary | ICD-10-CM | POA: Diagnosis not present

## 2019-01-21 DIAGNOSIS — N186 End stage renal disease: Secondary | ICD-10-CM | POA: Diagnosis not present

## 2019-01-21 DIAGNOSIS — D631 Anemia in chronic kidney disease: Secondary | ICD-10-CM | POA: Diagnosis not present

## 2019-01-22 DIAGNOSIS — E876 Hypokalemia: Secondary | ICD-10-CM | POA: Diagnosis not present

## 2019-01-22 DIAGNOSIS — E119 Type 2 diabetes mellitus without complications: Secondary | ICD-10-CM | POA: Diagnosis not present

## 2019-01-22 DIAGNOSIS — N2581 Secondary hyperparathyroidism of renal origin: Secondary | ICD-10-CM | POA: Diagnosis not present

## 2019-01-22 DIAGNOSIS — Z992 Dependence on renal dialysis: Secondary | ICD-10-CM | POA: Diagnosis not present

## 2019-01-22 DIAGNOSIS — N186 End stage renal disease: Secondary | ICD-10-CM | POA: Diagnosis not present

## 2019-01-22 DIAGNOSIS — D631 Anemia in chronic kidney disease: Secondary | ICD-10-CM | POA: Diagnosis not present

## 2019-01-24 DIAGNOSIS — N2581 Secondary hyperparathyroidism of renal origin: Secondary | ICD-10-CM | POA: Diagnosis not present

## 2019-01-24 DIAGNOSIS — E119 Type 2 diabetes mellitus without complications: Secondary | ICD-10-CM | POA: Diagnosis not present

## 2019-01-24 DIAGNOSIS — E876 Hypokalemia: Secondary | ICD-10-CM | POA: Diagnosis not present

## 2019-01-24 DIAGNOSIS — N186 End stage renal disease: Secondary | ICD-10-CM | POA: Diagnosis not present

## 2019-01-24 DIAGNOSIS — Z992 Dependence on renal dialysis: Secondary | ICD-10-CM | POA: Diagnosis not present

## 2019-01-24 DIAGNOSIS — I482 Chronic atrial fibrillation, unspecified: Secondary | ICD-10-CM | POA: Diagnosis not present

## 2019-01-24 DIAGNOSIS — D631 Anemia in chronic kidney disease: Secondary | ICD-10-CM | POA: Diagnosis not present

## 2019-01-25 ENCOUNTER — Ambulatory Visit (INDEPENDENT_AMBULATORY_CARE_PROVIDER_SITE_OTHER): Payer: Medicare Other | Admitting: Pharmacist

## 2019-01-25 DIAGNOSIS — Z5181 Encounter for therapeutic drug level monitoring: Secondary | ICD-10-CM

## 2019-01-25 DIAGNOSIS — I059 Rheumatic mitral valve disease, unspecified: Secondary | ICD-10-CM

## 2019-01-25 DIAGNOSIS — I079 Rheumatic tricuspid valve disease, unspecified: Secondary | ICD-10-CM | POA: Diagnosis not present

## 2019-01-25 LAB — POCT INR: INR: 3 (ref 2.0–3.0)

## 2019-01-25 NOTE — Patient Instructions (Signed)
Description   Continue taking 1 tablet every day.  Recheck INR in 3 weeks. [facility]in [facility] [phone] Pt's new number is [phone]

## 2019-01-26 DIAGNOSIS — E876 Hypokalemia: Secondary | ICD-10-CM | POA: Diagnosis not present

## 2019-01-26 DIAGNOSIS — N2581 Secondary hyperparathyroidism of renal origin: Secondary | ICD-10-CM | POA: Diagnosis not present

## 2019-01-26 DIAGNOSIS — E119 Type 2 diabetes mellitus without complications: Secondary | ICD-10-CM | POA: Diagnosis not present

## 2019-01-26 DIAGNOSIS — N186 End stage renal disease: Secondary | ICD-10-CM | POA: Diagnosis not present

## 2019-01-26 DIAGNOSIS — D631 Anemia in chronic kidney disease: Secondary | ICD-10-CM | POA: Diagnosis not present

## 2019-01-26 DIAGNOSIS — Z992 Dependence on renal dialysis: Secondary | ICD-10-CM | POA: Diagnosis not present

## 2019-01-28 DIAGNOSIS — E876 Hypokalemia: Secondary | ICD-10-CM | POA: Diagnosis not present

## 2019-01-28 DIAGNOSIS — N2581 Secondary hyperparathyroidism of renal origin: Secondary | ICD-10-CM | POA: Diagnosis not present

## 2019-01-28 DIAGNOSIS — Z992 Dependence on renal dialysis: Secondary | ICD-10-CM | POA: Diagnosis not present

## 2019-01-28 DIAGNOSIS — E119 Type 2 diabetes mellitus without complications: Secondary | ICD-10-CM | POA: Diagnosis not present

## 2019-01-28 DIAGNOSIS — N186 End stage renal disease: Secondary | ICD-10-CM | POA: Diagnosis not present

## 2019-01-28 DIAGNOSIS — D631 Anemia in chronic kidney disease: Secondary | ICD-10-CM | POA: Diagnosis not present

## 2019-01-29 DIAGNOSIS — N186 End stage renal disease: Secondary | ICD-10-CM | POA: Diagnosis not present

## 2019-01-29 DIAGNOSIS — N2581 Secondary hyperparathyroidism of renal origin: Secondary | ICD-10-CM | POA: Diagnosis not present

## 2019-01-29 DIAGNOSIS — Z992 Dependence on renal dialysis: Secondary | ICD-10-CM | POA: Diagnosis not present

## 2019-01-29 DIAGNOSIS — E876 Hypokalemia: Secondary | ICD-10-CM | POA: Diagnosis not present

## 2019-01-29 DIAGNOSIS — D631 Anemia in chronic kidney disease: Secondary | ICD-10-CM | POA: Diagnosis not present

## 2019-01-29 DIAGNOSIS — E119 Type 2 diabetes mellitus without complications: Secondary | ICD-10-CM | POA: Diagnosis not present

## 2019-01-31 ENCOUNTER — Other Ambulatory Visit: Payer: Self-pay | Admitting: Family Medicine

## 2019-01-31 DIAGNOSIS — G4709 Other insomnia: Secondary | ICD-10-CM

## 2019-01-31 DIAGNOSIS — I482 Chronic atrial fibrillation, unspecified: Secondary | ICD-10-CM | POA: Diagnosis not present

## 2019-01-31 DIAGNOSIS — M545 Low back pain, unspecified: Secondary | ICD-10-CM

## 2019-01-31 DIAGNOSIS — Z992 Dependence on renal dialysis: Secondary | ICD-10-CM | POA: Diagnosis not present

## 2019-01-31 DIAGNOSIS — N186 End stage renal disease: Secondary | ICD-10-CM | POA: Diagnosis not present

## 2019-01-31 DIAGNOSIS — D631 Anemia in chronic kidney disease: Secondary | ICD-10-CM | POA: Diagnosis not present

## 2019-01-31 DIAGNOSIS — E119 Type 2 diabetes mellitus without complications: Secondary | ICD-10-CM | POA: Diagnosis not present

## 2019-01-31 DIAGNOSIS — G8929 Other chronic pain: Secondary | ICD-10-CM

## 2019-01-31 DIAGNOSIS — N2581 Secondary hyperparathyroidism of renal origin: Secondary | ICD-10-CM | POA: Diagnosis not present

## 2019-01-31 DIAGNOSIS — E876 Hypokalemia: Secondary | ICD-10-CM | POA: Diagnosis not present

## 2019-02-02 ENCOUNTER — Ambulatory Visit: Payer: Medicare Other | Admitting: Family Medicine

## 2019-02-02 ENCOUNTER — Telehealth: Payer: Self-pay | Admitting: Pharmacist

## 2019-02-02 DIAGNOSIS — N186 End stage renal disease: Secondary | ICD-10-CM | POA: Diagnosis not present

## 2019-02-02 DIAGNOSIS — D631 Anemia in chronic kidney disease: Secondary | ICD-10-CM | POA: Diagnosis not present

## 2019-02-02 DIAGNOSIS — N2581 Secondary hyperparathyroidism of renal origin: Secondary | ICD-10-CM | POA: Diagnosis not present

## 2019-02-02 DIAGNOSIS — E876 Hypokalemia: Secondary | ICD-10-CM | POA: Diagnosis not present

## 2019-02-02 DIAGNOSIS — E119 Type 2 diabetes mellitus without complications: Secondary | ICD-10-CM | POA: Diagnosis not present

## 2019-02-02 DIAGNOSIS — Z992 Dependence on renal dialysis: Secondary | ICD-10-CM | POA: Diagnosis not present

## 2019-02-02 LAB — PROTIME-INR: INR: 4.4 — AB (ref 0.9–1.1)

## 2019-02-02 NOTE — Telephone Encounter (Signed)
Received fax from [facility] w/ INR of 4.4 on 01/31/2019. Attempted to call patient. Left voicemail to return call. Would like patient to hold coumadin one day due to elevated INR. Keep original follow up date.

## 2019-02-03 ENCOUNTER — Emergency Department (HOSPITAL_COMMUNITY): Payer: Medicare Other

## 2019-02-03 ENCOUNTER — Encounter (HOSPITAL_COMMUNITY): Payer: Self-pay | Admitting: Emergency Medicine

## 2019-02-03 ENCOUNTER — Observation Stay (HOSPITAL_COMMUNITY)
Admission: EM | Admit: 2019-02-03 | Discharge: 2019-02-05 | Disposition: A | Discharge status: Home / Self Care | Payer: Medicare Other | Attending: Internal Medicine | Admitting: Internal Medicine

## 2019-02-03 DIAGNOSIS — K7460 Unspecified cirrhosis of liver: Secondary | ICD-10-CM | POA: Insufficient documentation

## 2019-02-03 DIAGNOSIS — R52 Pain, unspecified: Secondary | ICD-10-CM | POA: Diagnosis not present

## 2019-02-03 DIAGNOSIS — Z9889 Other specified postprocedural states: Secondary | ICD-10-CM | POA: Diagnosis not present

## 2019-02-03 DIAGNOSIS — Z9181 History of falling: Secondary | ICD-10-CM | POA: Diagnosis not present

## 2019-02-03 DIAGNOSIS — K219 Gastro-esophageal reflux disease without esophagitis: Secondary | ICD-10-CM | POA: Insufficient documentation

## 2019-02-03 DIAGNOSIS — J90 Pleural effusion, not elsewhere classified: Secondary | ICD-10-CM | POA: Diagnosis not present

## 2019-02-03 DIAGNOSIS — E785 Hyperlipidemia, unspecified: Secondary | ICD-10-CM | POA: Diagnosis present

## 2019-02-03 DIAGNOSIS — I132 Hypertensive heart and chronic kidney disease with heart failure and with stage 5 chronic kidney disease, or end stage renal disease: Secondary | ICD-10-CM | POA: Diagnosis not present

## 2019-02-03 DIAGNOSIS — D696 Thrombocytopenia, unspecified: Secondary | ICD-10-CM | POA: Insufficient documentation

## 2019-02-03 DIAGNOSIS — I482 Chronic atrial fibrillation, unspecified: Secondary | ICD-10-CM | POA: Diagnosis present

## 2019-02-03 DIAGNOSIS — I420 Dilated cardiomyopathy: Secondary | ICD-10-CM | POA: Diagnosis not present

## 2019-02-03 DIAGNOSIS — I959 Hypotension, unspecified: Secondary | ICD-10-CM

## 2019-02-03 DIAGNOSIS — N186 End stage renal disease: Secondary | ICD-10-CM | POA: Diagnosis not present

## 2019-02-03 DIAGNOSIS — S62102D Fracture of unspecified carpal bone, left wrist, subsequent encounter for fracture with routine healing: Secondary | ICD-10-CM

## 2019-02-03 DIAGNOSIS — W010XXA Fall on same level from slipping, tripping and stumbling without subsequent striking against object, initial encounter: Secondary | ICD-10-CM | POA: Diagnosis not present

## 2019-02-03 DIAGNOSIS — W19XXXA Unspecified fall, initial encounter: Secondary | ICD-10-CM | POA: Diagnosis not present

## 2019-02-03 DIAGNOSIS — S52572A Other intraarticular fracture of lower end of left radius, initial encounter for closed fracture: Secondary | ICD-10-CM | POA: Diagnosis not present

## 2019-02-03 DIAGNOSIS — Z79899 Other long term (current) drug therapy: Secondary | ICD-10-CM | POA: Insufficient documentation

## 2019-02-03 DIAGNOSIS — I509 Heart failure, unspecified: Secondary | ICD-10-CM

## 2019-02-03 DIAGNOSIS — R0989 Other specified symptoms and signs involving the circulatory and respiratory systems: Secondary | ICD-10-CM | POA: Insufficient documentation

## 2019-02-03 DIAGNOSIS — E1122 Type 2 diabetes mellitus with diabetic chronic kidney disease: Secondary | ICD-10-CM | POA: Insufficient documentation

## 2019-02-03 DIAGNOSIS — I2510 Atherosclerotic heart disease of native coronary artery without angina pectoris: Secondary | ICD-10-CM | POA: Insufficient documentation

## 2019-02-03 DIAGNOSIS — R2689 Other abnormalities of gait and mobility: Secondary | ICD-10-CM | POA: Insufficient documentation

## 2019-02-03 DIAGNOSIS — R2681 Unsteadiness on feet: Secondary | ICD-10-CM | POA: Insufficient documentation

## 2019-02-03 DIAGNOSIS — I5042 Chronic combined systolic (congestive) and diastolic (congestive) heart failure: Secondary | ICD-10-CM | POA: Insufficient documentation

## 2019-02-03 DIAGNOSIS — M6281 Muscle weakness (generalized): Secondary | ICD-10-CM | POA: Insufficient documentation

## 2019-02-03 DIAGNOSIS — R21 Rash and other nonspecific skin eruption: Secondary | ICD-10-CM | POA: Diagnosis not present

## 2019-02-03 DIAGNOSIS — E1129 Type 2 diabetes mellitus with other diabetic kidney complication: Secondary | ICD-10-CM | POA: Diagnosis present

## 2019-02-03 DIAGNOSIS — M5489 Other dorsalgia: Secondary | ICD-10-CM | POA: Diagnosis not present

## 2019-02-03 DIAGNOSIS — S62102A Fracture of unspecified carpal bone, left wrist, initial encounter for closed fracture: Secondary | ICD-10-CM

## 2019-02-03 DIAGNOSIS — M542 Cervicalgia: Secondary | ICD-10-CM | POA: Diagnosis not present

## 2019-02-03 DIAGNOSIS — R001 Bradycardia, unspecified: Secondary | ICD-10-CM | POA: Diagnosis not present

## 2019-02-03 DIAGNOSIS — S52692A Other fracture of lower end of left ulna, initial encounter for closed fracture: Secondary | ICD-10-CM | POA: Diagnosis not present

## 2019-02-03 DIAGNOSIS — Z87891 Personal history of nicotine dependence: Secondary | ICD-10-CM | POA: Insufficient documentation

## 2019-02-03 DIAGNOSIS — S0990XA Unspecified injury of head, initial encounter: Secondary | ICD-10-CM | POA: Diagnosis not present

## 2019-02-03 DIAGNOSIS — M545 Low back pain: Secondary | ICD-10-CM | POA: Diagnosis not present

## 2019-02-03 DIAGNOSIS — Z888 Allergy status to other drugs, medicaments and biological substances status: Secondary | ICD-10-CM | POA: Insufficient documentation

## 2019-02-03 DIAGNOSIS — S3993XA Unspecified injury of pelvis, initial encounter: Secondary | ICD-10-CM | POA: Diagnosis not present

## 2019-02-03 DIAGNOSIS — Z952 Presence of prosthetic heart valve: Secondary | ICD-10-CM | POA: Diagnosis not present

## 2019-02-03 DIAGNOSIS — I4821 Permanent atrial fibrillation: Secondary | ICD-10-CM | POA: Insufficient documentation

## 2019-02-03 DIAGNOSIS — G4733 Obstructive sleep apnea (adult) (pediatric): Secondary | ICD-10-CM | POA: Insufficient documentation

## 2019-02-03 DIAGNOSIS — M109 Gout, unspecified: Secondary | ICD-10-CM | POA: Insufficient documentation

## 2019-02-03 DIAGNOSIS — S52602A Unspecified fracture of lower end of left ulna, initial encounter for closed fracture: Secondary | ICD-10-CM | POA: Diagnosis not present

## 2019-02-03 DIAGNOSIS — Z7901 Long term (current) use of anticoagulants: Secondary | ICD-10-CM | POA: Insufficient documentation

## 2019-02-03 DIAGNOSIS — J9811 Atelectasis: Secondary | ICD-10-CM | POA: Diagnosis not present

## 2019-02-03 DIAGNOSIS — R0902 Hypoxemia: Secondary | ICD-10-CM | POA: Diagnosis not present

## 2019-02-03 DIAGNOSIS — S199XXA Unspecified injury of neck, initial encounter: Secondary | ICD-10-CM | POA: Diagnosis not present

## 2019-02-03 DIAGNOSIS — Z992 Dependence on renal dialysis: Secondary | ICD-10-CM | POA: Diagnosis not present

## 2019-02-03 LAB — COMPREHENSIVE METABOLIC PANEL
ALK PHOS: 162 U/L — AB (ref 38–126)
ALT: 26 U/L (ref 0–44)
AST: 40 U/L (ref 15–41)
Albumin: 3.5 g/dL (ref 3.5–5.0)
Anion gap: 9 (ref 5–15)
BUN: 19 mg/dL (ref 6–20)
CO2: 27 mmol/L (ref 22–32)
CREATININE: 5.59 mg/dL — AB (ref 0.61–1.24)
Calcium: 9.1 mg/dL (ref 8.9–10.3)
Chloride: 96 mmol/L — ABNORMAL LOW (ref 98–111)
GFR calc Af Amer: 12 mL/min — ABNORMAL LOW (ref >60)
GFR calc non Af Amer: 10 mL/min — ABNORMAL LOW (ref >60)
Glucose, Bld: 93 mg/dL (ref 70–99)
Potassium: 3.8 mmol/L (ref 3.5–5.1)
SODIUM: 132 mmol/L — AB (ref 135–145)
Total Bilirubin: 1.4 mg/dL — ABNORMAL HIGH (ref 0.3–1.2)
Total Protein: 8.5 g/dL — ABNORMAL HIGH (ref 6.5–8.1)

## 2019-02-03 LAB — PROTIME-INR
INR: 2.6 — ABNORMAL HIGH (ref 0.8–1.2)
Prothrombin Time: 27.7 seconds — ABNORMAL HIGH (ref 11.4–15.2)

## 2019-02-03 LAB — CBG MONITORING, ED: Glucose-Capillary: 84 mg/dL (ref 70–99)

## 2019-02-03 LAB — CBC WITH DIFFERENTIAL/PLATELET
Abs Immature Granulocytes: 0.02 10*3/uL (ref 0.00–0.07)
BASOS PCT: 1 %
Basophils Absolute: 0.1 10*3/uL (ref 0.0–0.1)
Eosinophils Absolute: 0.3 10*3/uL (ref 0.0–0.5)
Eosinophils Relative: 5 %
HCT: 36.3 % — ABNORMAL LOW (ref 39.0–52.0)
Hemoglobin: 11.5 g/dL — ABNORMAL LOW (ref 13.0–17.0)
Immature Granulocytes: 0 %
Lymphocytes Relative: 17 %
Lymphs Abs: 1.1 10*3/uL (ref 0.7–4.0)
MCH: 26.6 pg (ref 26.0–34.0)
MCHC: 31.7 g/dL (ref 30.0–36.0)
MCV: 83.8 fL (ref 80.0–100.0)
MONOS PCT: 14 %
Monocytes Absolute: 0.9 10*3/uL (ref 0.1–1.0)
Neutro Abs: 4.1 10*3/uL (ref 1.7–7.7)
Neutrophils Relative %: 63 %
PLATELETS: 136 10*3/uL — AB (ref 150–400)
RBC: 4.33 MIL/uL (ref 4.22–5.81)
RDW: 16.9 % — ABNORMAL HIGH (ref 11.5–15.5)
WBC: 6.5 10*3/uL (ref 4.0–10.5)
nRBC: 0 % (ref 0.0–0.2)

## 2019-02-03 LAB — LACTIC ACID, PLASMA: Lactic Acid, Venous: 1.3 mmol/L (ref 0.5–1.9)

## 2019-02-03 LAB — APTT: aPTT: 52 seconds — ABNORMAL HIGH (ref 24–36)

## 2019-02-03 LAB — LIPASE, BLOOD: Lipase: 39 U/L (ref 11–51)

## 2019-02-03 LAB — MRSA PCR SCREENING: MRSA by PCR: NEGATIVE

## 2019-02-03 MED ORDER — DULOXETINE HCL 60 MG PO CPEP
60.0000 mg | ORAL_CAPSULE | Freq: Every day | ORAL | Status: DC
  Administered 2019-02-03 – 2019-02-05 (×3): 60 mg via ORAL
  Filled 2019-02-03 (×3): qty 1

## 2019-02-03 MED ORDER — AMIODARONE HCL 200 MG PO TABS
200.0000 mg | ORAL_TABLET | Freq: Two times a day (BID) | ORAL | Status: DC
  Filled 2019-02-03: qty 1

## 2019-02-03 MED ORDER — SODIUM CHLORIDE 0.9 % IV BOLUS
500.0000 mL | Freq: Once | INTRAVENOUS | Status: AC
  Administered 2019-02-03: 500 mL via INTRAVENOUS

## 2019-02-03 MED ORDER — GABAPENTIN 100 MG PO CAPS
200.0000 mg | ORAL_CAPSULE | Freq: Every day | ORAL | Status: DC
  Administered 2019-02-03 – 2019-02-05 (×3): 200 mg via ORAL
  Filled 2019-02-03 (×3): qty 2

## 2019-02-03 MED ORDER — ACETAMINOPHEN 650 MG RE SUPP: 650.0000 mg | Freq: Four times a day (QID) | RECTAL | Status: DC | PRN

## 2019-02-03 MED ORDER — MIDODRINE HCL 5 MG PO TABS
10.0000 mg | ORAL_TABLET | Freq: Three times a day (TID) | ORAL | Status: DC
  Administered 2019-02-03 – 2019-02-05 (×7): 10 mg via ORAL
  Filled 2019-02-03 (×9): qty 2

## 2019-02-03 MED ORDER — ATORVASTATIN CALCIUM 10 MG PO TABS
20.0000 mg | ORAL_TABLET | Freq: Every day | ORAL | Status: DC
  Administered 2019-02-03 – 2019-02-05 (×3): 20 mg via ORAL
  Filled 2019-02-03 (×3): qty 2

## 2019-02-03 MED ORDER — WARFARIN - PHARMACIST DOSING INPATIENT
Freq: Every day | Status: DC
  Administered 2019-02-04: 18:00:00

## 2019-02-03 MED ORDER — ACETAMINOPHEN 500 MG PO TABS
1000.0000 mg | ORAL_TABLET | Freq: Three times a day (TID) | ORAL | Status: DC | PRN
  Administered 2019-02-04 – 2019-02-05 (×2): 1000 mg via ORAL
  Filled 2019-02-03 (×2): qty 2

## 2019-02-03 MED ORDER — OXYCODONE HCL 5 MG PO TABS
5.0000 mg | ORAL_TABLET | ORAL | Status: DC | PRN
  Administered 2019-02-03 – 2019-02-05 (×5): 5 mg via ORAL
  Filled 2019-02-03 (×6): qty 1

## 2019-02-03 MED ORDER — SODIUM CHLORIDE 0.9 % IV SOLN
1.0000 g | INTRAVENOUS | Status: DC
  Filled 2019-02-03: qty 1

## 2019-02-03 MED ORDER — SODIUM CHLORIDE 0.9 % IV BOLUS: 1000.0000 mL | Freq: Once | INTRAVENOUS | Status: DC

## 2019-02-03 MED ORDER — SODIUM CHLORIDE 0.9 % IV BOLUS
250.0000 mL | Freq: Once | INTRAVENOUS | Status: AC
  Administered 2019-02-03: 250 mL via INTRAVENOUS

## 2019-02-03 MED ORDER — VANCOMYCIN HCL IN DEXTROSE 750-5 MG/150ML-% IV SOLN: 750.0000 mg | INTRAVENOUS | Status: DC

## 2019-02-03 MED ORDER — WARFARIN SODIUM 2.5 MG PO TABS
2.5000 mg | ORAL_TABLET | Freq: Once | ORAL | Status: AC
  Administered 2019-02-04: 2.5 mg via ORAL
  Filled 2019-02-03: qty 1

## 2019-02-03 MED ORDER — CALCIUM ACETATE (PHOS BINDER) 667 MG PO CAPS
667.0000 mg | ORAL_CAPSULE | Freq: Three times a day (TID) | ORAL | Status: DC
  Administered 2019-02-04 – 2019-02-05 (×5): 667 mg via ORAL
  Filled 2019-02-03 (×6): qty 1

## 2019-02-03 MED ORDER — MORPHINE SULFATE (PF) 2 MG/ML IV SOLN
2.0000 mg | Freq: Once | INTRAVENOUS | Status: AC
  Administered 2019-02-04: 2 mg via INTRAVENOUS
  Filled 2019-02-03: qty 1

## 2019-02-03 MED ORDER — MIDODRINE HCL 5 MG PO TABS
10.0000 mg | ORAL_TABLET | Freq: Three times a day (TID) | ORAL | Status: DC
  Filled 2019-02-03: qty 2

## 2019-02-03 MED ORDER — VANCOMYCIN HCL 10 G IV SOLR
1250.0000 mg | Freq: Once | INTRAVENOUS | Status: DC
  Administered 2019-02-03: 1250 mg via INTRAVENOUS
  Filled 2019-02-03: qty 1250

## 2019-02-03 MED ORDER — ACETAMINOPHEN 325 MG PO TABS
650.0000 mg | ORAL_TABLET | Freq: Once | ORAL | Status: AC
  Administered 2019-02-03: 650 mg via ORAL
  Filled 2019-02-03: qty 2

## 2019-02-03 MED ORDER — SODIUM CHLORIDE 0.9% FLUSH
3.0000 mL | Freq: Two times a day (BID) | INTRAVENOUS | Status: DC
  Administered 2019-02-04 – 2019-02-05 (×3): 3 mL via INTRAVENOUS

## 2019-02-03 NOTE — Progress Notes (Signed)
ANTICOAGULATION CONSULT NOTE - Initial Consult  Pharmacy Consult for warfarin Indication: mechanical valve  Allergies  Allergen Reactions  . Triamcinolone Other (See Comments)    Felt like skin was burning    Patient Measurements: Weight: 146 lb (66.2 kg)  Vital Signs: Temp: 97.6 F (36.4 C) (03/19 1308) Temp Source: Oral (03/19 1308) BP: 86/63 (03/19 1934) Pulse Rate: 93 (03/19 1934)  Labs: Recent Labs    02/03/19 1350 02/03/19 1416  HGB 11.5*  --   HCT 36.3*  --   PLT 136*  --   APTT 52*  --   LABPROT 27.7*  --   INR 2.6*  --   CREATININE  --  5.59*    Estimated Creatinine Clearance: 12.4 mL/min (A) (by C-G formula based on SCr of 5.59 mg/dL (H)).   Medical History: Past Medical History:  Diagnosis Date  . Abdominal pain 02/25/2016  . Acute encephalopathy 08/06/2018  . Acute hypoxemic respiratory failure ([place]) 12/29/2015  . Advance care planning   . Anemia - mild exacerbation 10/15/2015  . Anemia of chronic disease   . Aortic regurgitation 02/27/2016  . Asthma    said he does not know  . Atrial fibrillation with RVR ([place]) 02/19/2016  . Calf pain 09/10/2015  . Chronic kidney disease (CKD), stage IV (severe) ([place])   . Chronic systolic CHF (congestive heart failure) (HCC)    a. prior EF normal; b. Echo 10/16: Mild LVH, EF 30-35%, mitral valve bioprosthesis present without evidence of stenosis or regurgitation, severe LAE, mild RVE with mild to moderately reduced RVSF, moderate RAE  . Cirrhosis ([place])   . Congestive heart failure (CHF) ([place]) 02/25/2016  . Coronary artery disease   . Diabetes mellitus type 2 in obese ([place])   . Diabetes mellitus without complication ([place])   . Dilated cardiomyopathy ([place]) 12/24/2015  . Dyspnea   . Elevated troponin 11/24/2017  . ESRD (end stage renal disease) on dialysis ([place])   . Fluid overload 11/24/2017  . GERD (gastroesophageal reflux disease)   . Gout 10/23/2015  . H/O tricuspid valve repair 2012  . History of echocardiogram    Echo at [facility] 4/12: mod LVH, EF 60-65%, mod LAE, tissue MVR ok, mod TR, mod pulmo HTN with RVSP 48 mmHg  . HLD (hyperlipidemia) 10/05/2016  . Hyperkalemia 11/24/2017  . Hypertension   . Mitral valve disease   . Obstructive sleep apnea   . Pancreatitis, ruled out 02/25/2016  . Paroxysmal atrial fibrillation (HCC)    s/p Maze procedure at time of MVR in 2011  . Permanent atrial fibrillation 02/25/2016  . Pleural effusion   . Polypharmacy 11/23/2015  . Pulmonary HTN ([place])   . 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 Septic shock ([place])   . Syncope and collapse 08/10/2017  . Thrombocytopenia ([place])   . Viral gastroenteritis 02/27/2016    Assessment: 70 yoM admitted 3/19 with history of atrial fibrillation w/ MVR on warfarin PTA. INR on admit therapeutic at 2.6. CBC appears at baseline on admit Hgb 11.5, HCT 36.3, pltc 136.    PTA dose: warfarin 2.5 mg PO daily  Goal of Therapy:  INR 2-3 Monitor platelets by anticoagulation protocol: Yes   Plan:  Give warfarin 2.5 mg PO x 1 tonight Monitor daily INR Monitor for s/sx of bleeding  Thank you for allowing pharmacy to be a part of this patient's care.  Leron Croak, PharmD PGY1 Pharmacy Resident Phone: [phone]  Please check  AMION for all [facility] phone numbers 02/03/2019,7:50 PM

## 2019-02-03 NOTE — ED Provider Notes (Signed)
[facility] EMERGENCY DEPARTMENT Provider Note   CSN: [id number] Arrival date & time: 02/03/19  1254    History   Chief Complaint Chief Complaint  Patient presents with  . Fall  . Wrist Injury    HPI Michael [name] is a 58 y.o. male presenting today via EMS after fall.  Patient reports that he was walking in his kitchen using his cane when his legs gave out and he tripped forward falling onto his left wrist.  Patient is unsure of loss of consciousness.  Patient's only concern on my evaluation is a left wrist pain there is deformity present.  He describes pain as a moderate intensity throbbing constant worsened with movement and palpation and improved with rest.  Of note patient does take Coumadin daily and is a dialysis patient last dialyzed yesterday.  Patient denies headache, vision changes, neck pain, chest pain/shortness of breath, back pain, abdominal pain, nausea/vomiting or any other concerns today.  Additionally patient's native language is [name] however he is able to communicate effectively in [place] without Optometrist.  Patient was noted to be hypotensive in the fast-track area so he was moved to our unit.  Review of historical blood pressures reveals that patient appears to be at baseline.  Additionally patient reports regular use of home O2 he is on 2 L nasal cannula with SPO2 mid-low 90s he is without chest pain or shortness of breath.  Further history reveals that patient has had mild nonproductive cough for the past 3 days without fever or chest pain.     HPI  Past Medical History:  Diagnosis Date  . Abdominal pain 02/25/2016  . Acute encephalopathy 08/06/2018  . Acute hypoxemic respiratory failure ([place]) 12/29/2015  . Advance care planning   . Anemia - mild exacerbation 10/15/2015  . Anemia of chronic disease   . Aortic regurgitation 02/27/2016  . Asthma    said he does not know  . Atrial fibrillation with RVR ([place]) 02/19/2016  . Calf pain  09/10/2015  . Chronic kidney disease (CKD), stage IV (severe) ([place])   . Chronic systolic CHF (congestive heart failure) (HCC)    a. prior EF normal; b. Echo 10/16: Mild LVH, EF 30-35%, mitral valve bioprosthesis present without evidence of stenosis or regurgitation, severe LAE, mild RVE with mild to moderately reduced RVSF, moderate RAE  . Cirrhosis ([place])   . Congestive heart failure (CHF) ([place]) 02/25/2016  . Coronary artery disease   . Diabetes mellitus type 2 in obese ([place])   . Diabetes mellitus without complication ([place])   . Dilated cardiomyopathy ([place]) 12/24/2015  . Dyspnea   . Elevated troponin 11/24/2017  . ESRD (end stage renal disease) on dialysis ([place])   . Fluid overload 11/24/2017  . GERD (gastroesophageal reflux disease)   . Gout 10/23/2015  . H/O tricuspid valve repair 2012  . History of echocardiogram    Echo at [facility] 4/12: mod LVH, EF 60-65%, mod LAE, tissue MVR ok, mod TR, mod pulmo HTN with RVSP 48 mmHg  . HLD (hyperlipidemia) 10/05/2016  . Hyperkalemia 11/24/2017  . Hypertension   . Mitral valve disease   . Obstructive sleep apnea   . Pancreatitis, ruled out 02/25/2016  . Paroxysmal atrial fibrillation (HCC)    s/p Maze procedure at time of MVR in 2011  . Permanent atrial fibrillation 02/25/2016  . Pleural effusion   . Polypharmacy 11/23/2015  . Pulmonary HTN ([place])   . 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  Septic shock ([place])   . Syncope and collapse 08/10/2017  . Thrombocytopenia ([place])   . Viral gastroenteritis 02/27/2016    Patient Active Problem List   Diagnosis Date Noted  . Fall 02/03/2019  . Left wrist fracture, closed, initial encounter 02/03/2019  . Hypotension 02/03/2019  . Acute encephalopathy 08/06/2018  . Pleural effusion   . ESRD (end stage renal disease) ([place]) 12/01/2017  . CAD (coronary artery disease) 11/24/2017  . Hyperkalemia 11/24/2017  . Fluid overload 11/24/2017  . Elevated troponin 11/24/2017  . Dyspnea 09/18/2017   . Chronic renal failure   . Advance care planning   . Pulmonary hypertension, unspecified ([place])   . Chest pain   . Biventricular heart failure ([place]) 08/10/2017  . Syncope and collapse 08/10/2017  . Cardiac arrest ([place])   . Benign neoplasm of ascending colon   . Rectal bleeding   . ESRD (end stage renal disease) on dialysis ([place])   . Goals of care, counseling/discussion   . Palliative care by specialist   . Acute kidney injury ([place]) 06/25/2017  . Anasarca 05/30/2017  . HLD (hyperlipidemia) 10/05/2016  . Mouth bleeding 10/05/2016  . Bilateral knee pain 10/05/2016  . Petechial rash 10/05/2016  . Laceration of tongue   . Insomnia 06/10/2016  . Supratherapeutic INR 06/01/2016  . Acute hepatic encephalopathy 03/15/2016  . Liver cirrhosis ([place]) 03/15/2016  . Volume overload 03/15/2016  . Acute congestive heart failure ([place])   . Insulin dependent diabetes mellitus ([place])   . Acute renal failure (ARF) ([place]) 03/14/2016  . GERD (gastroesophageal reflux disease) 03/04/2016  . Aortic regurgitation 02/27/2016  . Viral gastroenteritis 02/27/2016  . A-fib ([place]) 02/26/2016  . Abdominal pain 02/25/2016  . Diabetes mellitus with complication ([place]) 84/16/6063  . Permanent atrial fibrillation 02/25/2016  . Congestive heart failure (CHF) ([place]) 02/25/2016  . Anemia, chronic disease 02/25/2016  . Atrial fibrillation with RVR ([place]) 02/19/2016  . Pain in joint, ankle and foot 01/10/2016  . Asthma   . Acute hypoxemic respiratory failure ([place]) 12/29/2015  . Acute respiratory failure ([place]) 12/29/2015  . Chronic atrial fibrillation 12/24/2015  . Dilated cardiomyopathy ([place]) 12/24/2015  . Polypharmacy 11/23/2015  . Gout 10/23/2015  . Encounter for therapeutic drug monitoring 10/19/2015  . Anemia - mild exacerbation 10/15/2015  . Tricuspid valve disease 10/09/2015  . Mitral valve disease   . Calf pain 09/10/2015  . Type II diabetes mellitus with renal manifestations ([place]) 09/10/2015  . Essential  hypertension 09/10/2015    Past Surgical History:  Procedure Laterality Date  . AV FISTULA PLACEMENT Left 07/13/2017   Procedure: LEFT ARM ARTERIOVENOUS (AV) FISTULA CREATION;  Surgeon: Waynetta Sandy, MD;  Location: [facility];  Service: Vascular;  Laterality: Left;  . BASCILIC VEIN TRANSPOSITION Left 11/03/2017   Procedure: BASILIC VEIN TRANSPOSITION SECOND STAGE;  Surgeon: Waynetta Sandy, MD;  Location: [facility];  Service: Vascular;  Laterality: Left;  . CARDIAC SURGERY    . CARDIAC VALVE REPLACEMENT  2011  . CARDIOVERSION N/A 07/09/2017   Procedure: CARDIOVERSION;  Surgeon: Larey Dresser, MD;  Location: [facility] ENDOSCOPY;  Service: Cardiovascular;  Laterality: N/A;  . COLONOSCOPY N/A 07/16/2017   Procedure: COLONOSCOPY;  Surgeon: Jerene Bears, MD;  Location: [facility];  Service: Gastroenterology;  Laterality: N/A;  . ESOPHAGOGASTRODUODENOSCOPY N/A 07/16/2017   Procedure: ESOPHAGOGASTRODUODENOSCOPY (EGD);  Surgeon: Jerene Bears, MD;  Location[facility]OSCOPY;  Service: Gastroenterology;  Laterality: N/A;  . INSERTION OF DIALYSIS CATHETER Right 07/13/2017   Procedure: INSERTION OF DIALYSIS CATHETER  RIGHT INTERNAL JUGULAR;  Surgeon: Waynetta Sandy, MD;  Location: [facility];  Service: Vascular;  Laterality: Right;  . RIGHT HEART CATH N/A 06/29/2017   Procedure: RIGHT HEART CATH;  Surgeon: Larey Dresser, MD;  Location: [facility] CV LAB;  Service: Cardiovascular;  Laterality: N/A;  . RIGHT/LEFT HEART CATH AND CORONARY ANGIOGRAPHY N/A 08/19/2017   Procedure: RIGHT/LEFT HEART CATH AND CORONARY ANGIOGRAPHY;  Surgeon: Leonie Man, MD;  Location: [facility] CV LAB;  Service: Cardiovascular;  Laterality: N/A;  . TEE WITHOUT CARDIOVERSION N/A 07/09/2017   Procedure: TRANSESOPHAGEAL ECHOCARDIOGRAM (TEE);  Surgeon: Larey Dresser, MD;  Location: [facility] ENDOSCOPY;  Service: Cardiovascular;  Laterality: N/A;        Home Medications    Prior to Admission medications    Medication Sig Start Date End Date Taking? Authorizing Provider  amiodarone (PACERONE) 200 MG tablet TAKE 1 TABLET BY MOUTH TWICE A DAY Patient taking differently: Take 200 mg by mouth 2 (two) times daily.  07/14/18  Yes Fay Records, MD  atorvastatin (LIPITOR) 20 MG tablet TAKE 1 TABLET (20 MG TOTAL) BY MOUTH DAILY. PLEASE KEEP UPCOMING APPT. THANK YOU 01/14/19  Yes Fay Records, MD  B Complex-C-Zn-Folic Acid (DIALYVITE/ZINC) TABS Take 1 tablet by mouth daily. 09/15/18  Yes [provider]  baclofen (LIORESAL) 10 MG tablet Take 0.5 tablets (5 mg total) by mouth 3 (three) times daily as needed for muscle spasms. 12/21/18  Yes Hilts, Legrand Como, MD  calcium acetate (PHOSLO) 667 MG capsule Take 1 capsule (667 mg total) by mouth 3 (three) times daily with meals. Please keep upcoming appt. Thank You 03/09/18  Yes Fay Records, MD  cetirizine (ZYRTEC) 10 MG tablet Take 1 tablet (10 mg total) by mouth daily. 12/09/18  Yes Argentina Donovan, PA-C  colchicine 0.6 MG tablet Take 0.5 tablets (0.3 mg total) by mouth 2 (two) times a week. 09/21/17  Yes Tawny Asal, MD  diazepam (VALIUM) 5 MG tablet Take 5 mg by mouth at bedtime as needed (sleep).   Yes [provider]  diphenhydramine-acetaminophen (TYLENOL PM) 25-500 MG TABS tablet Take 1 tablet by mouth at bedtime as needed (pain).   Yes [provider]  DULoxetine (CYMBALTA) 60 MG capsule Take 1 capsule (60 mg total) by mouth daily. For back pain 01/06/19  Yes Newlin, Enobong, MD  fluticasone (FLONASE) 50 MCG/ACT nasal spray Place 2 sprays into both nostrils daily. 12/14/17  Yes Bonnielee Haff, MD  folic acid (FOLVITE) 1 MG tablet TAKE 1 TABLET BY MOUTH EVERY DAY Patient taking differently: Take 1 mg by mouth daily.  11/22/18  Yes Charlott Rakes, MD  gabapentin (NEURONTIN) 100 MG capsule Take 200 mg by mouth at bedtime. FOR LEG PAIN   Yes [provider]  lidocaine (XYLOCAINE) 2 % solution Swish and spit 15 ml Q 6 hrs PRN  09/14/18  Yes Ladell Pier, MD  metoprolol tartrate (LOPRESSOR) 25 MG tablet Take 1 tablet (25 mg total) by mouth 2 (two) times daily. Patient taking differently: Take 12.5 mg by mouth 2 (two) times daily.  07/22/18  Yes Daune Perch, NP  midodrine (PROAMATINE) 10 MG tablet Take 5 mg by mouth 3 (three) times daily with meals.  12/21/18  Yes [provider]  multivitamin (RENA-VIT) TABS tablet Take 1 tablet by mouth at bedtime. 12/14/17  Yes Bonnielee Haff, MD  OXYGEN Inhale 2 L into the lungs daily as needed (shortness of breath).    Yes [provider]  traZODone (DESYREL) 100 MG tablet Take 1 tablet (100 mg total) by mouth at bedtime. for sleep 01/06/19  Yes Charlott Rakes, MD  warfarin (COUMADIN) 2.5 MG tablet TAKE AS DIRECTED BY COUMADIN CLINIC Patient taking differently: Take 2.5 mg by mouth daily. TAKE  AS DIRECTED BY COUMADIN CLINIC 01/17/19  Yes Jerline Pain, MD  ondansetron (ZOFRAN-ODT) 4 MG disintegrating tablet Take 1 tablet (4 mg total) by mouth every 8 (eight) hours as needed for nausea or vomiting. Patient not taking: Reported on 02/03/2019 12/09/18   Argentina Donovan, PA-C  traMADol (ULTRAM) 50 MG tablet Take 1 tablet (50 mg total) by mouth every 8 (eight) hours as needed. Patient not taking: Reported on 02/03/2019 12/09/18   Argentina Donovan, PA-C    Family History Family History  Problem Relation Age of Onset  . Heart attack Neg Hx     Social History Social History   Tobacco Use  . Smoking status: Former Smoker    Packs/day: 1.00    Types: Cigarettes  . Smokeless tobacco: Never Used  Substance Use Topics  . Alcohol use: No  . Drug use: No     Allergies   Triamcinolone   Review of Systems Review of Systems  Constitutional: Negative.  Negative for chills and fever.  Eyes: Negative.  Negative for visual disturbance.  Respiratory: Positive for cough. Negative for shortness of breath.   Cardiovascular: Negative.  Negative for chest pain.   Gastrointestinal: Negative.  Negative for abdominal pain, nausea and vomiting.  Musculoskeletal: Positive for arthralgias. Negative for back pain and neck pain.  Skin: Negative.  Negative for wound.  Neurological: Negative.  Negative for weakness, numbness and headaches. Syncope: Unsure if brief LOC.  All other systems reviewed and are negative.  Physical Exam Updated Vital Signs BP (!) 86/63   Pulse 93   Temp 97.6 F (36.4 C) (Oral)   Resp 13   Wt 66.2 kg   SpO2 97%   BMI 24.30 kg/m   Physical Exam Constitutional:      General: He is not in acute distress.    Appearance: He is obese. He is not toxic-appearing.     Comments: Chronically ill appearing  HENT:     Head: Normocephalic and atraumatic. No raccoon eyes or Battle's sign.     Jaw: There is normal jaw occlusion. No trismus.     Right Ear: Tympanic membrane, ear canal and external ear normal. No hemotympanum.     Left Ear: Tympanic membrane, ear canal and external ear normal. No hemotympanum.     Nose: Nose normal. No rhinorrhea.     Right Nostril: No epistaxis.     Left Nostril: No epistaxis.     Mouth/Throat:     Lips: Pink.     Mouth: Mucous membranes are moist.     Pharynx: Oropharynx is clear. Uvula midline.  Eyes:     General: Vision grossly intact. Gaze aligned appropriately.     Extraocular Movements: Extraocular movements intact.     Comments: Visual Fields grossly intact bilaterally.  Neck:     Musculoskeletal: Full passive range of motion without pain, normal range of motion and neck supple. No spinous process tenderness or muscular tenderness.     Trachea: Trachea and phonation normal. No tracheal tenderness or tracheal deviation.  Cardiovascular:     Rate and Rhythm: Normal rate and regular rhythm.     Pulses:          Radial pulses are 2+ on  the right side and 2+ on the left side.       Dorsalis pedis pulses are 2+ on the right side and 2+ on the left side.       Posterior tibial pulses are 2+ on  the right side and 2+ on the left side.     Heart sounds: Normal heart sounds.  Pulmonary:     Effort: Pulmonary effort is normal. No accessory muscle usage or respiratory distress.     Breath sounds: Normal breath sounds and air entry. No decreased breath sounds.  Chest:     Chest wall: No deformity, tenderness or crepitus.     Comments: No sign of injury Abdominal:     General: Bowel sounds are normal. There is no distension.     Palpations: Abdomen is soft.     Tenderness: There is no abdominal tenderness. There is no guarding or rebound.     Comments: No sign of injury  Musculoskeletal:     Comments: No midline C/T/L spinal tenderness to palpation, no paraspinal muscle tenderness, no deformity, crepitus, or step-off noted. No sign of injury to the neck or back.  No pain with palpation of the hips bilaterally.  Patient able to bring bilateral knees to chest without difficulty or pain.  Patient able to pull himself to a seated position without difficulty or assistance. ------------------------- Deformity noted of the left wrist, wrist appears shortened.  Capillary refill and sensation intact to all fingers.  Radial pulses intact and equal bilaterally.  Movement of fingers intact with some increase in pain to the left hand.  Feet:     Right foot:     Protective Sensation: 5 sites tested. 5 sites sensed.     Left foot:     Protective Sensation: 5 sites tested. 5 sites sensed.  Neurological:     Mental Status: He is alert and oriented to person, place, and time.     GCS: GCS eye subscore is 4. GCS verbal subscore is 5. GCS motor subscore is 6.     Comments: Mental Status: Alert, oriented x3, responds appropriately to questions.  Gives appropriate history as to events today.  Able to follow two-step commands without difficulty. Cranial nerves II through XII grossly intact bilaterally. Motor: Normal tone, 5/5 grip strength right side, decreased grip strength left side secondary to pain.   5/5 dorsiflexion and plantarflexion bilateral lower extremities.  Full range of motion and 5/5 strength at the knees and hips bilaterally.   Sensory: Sensation intact to light touch in all extremities. CV: distal pulses palpable throughout     ED Treatments / Results  Labs (all labs ordered are listed, but only abnormal results are displayed) Labs Reviewed  CBC WITH DIFFERENTIAL/PLATELET - Abnormal; Notable for the following components:      Result Value   Hemoglobin 11.5 (*)    HCT 36.3 (*)    RDW 16.9 (*)    Platelets 136 (*)    All other components within normal limits  PROTIME-INR - Abnormal; Notable for the following components:   Prothrombin Time 27.7 (*)    INR 2.6 (*)    All other components within normal limits  APTT - Abnormal; Notable for the following components:   aPTT 52 (*)    All other components within normal limits  COMPREHENSIVE METABOLIC PANEL - Abnormal; Notable for the following components:   Sodium 132 (*)    Chloride 96 (*)    Creatinine, Ser 5.59 (*)  Total Protein 8.5 (*)    Alkaline Phosphatase 162 (*)    Total Bilirubin 1.4 (*)    GFR calc non Af Amer 10 (*)    GFR calc Af Amer 12 (*)    All other components within normal limits  CULTURE, BLOOD (ROUTINE X 2)  LIPASE, BLOOD  LACTIC ACID, PLASMA  URINALYSIS, ROUTINE W REFLEX MICROSCOPIC  LACTIC ACID, PLASMA  HIV ANTIBODY (ROUTINE TESTING W REFLEX)  CBC  RENAL FUNCTION PANEL  PROTIME-INR  CBG MONITORING, ED    EKG EKG Interpretation  Date/Time:  Thursday February 03 2019 15:25:49 EDT Ventricular Rate:  53 PR Interval:    QRS Duration: 151 QT Interval:  619 QTC Calculation: 582 R Axis:   -7 Text Interpretation:  Atrial fibrillation Right bundle branch block Baseline wander in lead(s) I No significant change since last tracing Confirmed by Gareth Morgan [phone]) on 02/03/2019 6:15:08 PM   Radiology Dg Chest 1 View  Result Date: 02/03/2019 CLINICAL DATA:  [place] at home EXAM: CHEST  1  VIEW COMPARISON:  09/13/2018 FINDINGS: Post sternotomy changes. Marked cardiomegaly. Small mildly loculated right pleural effusion. Overall decreased compared to prior. Aortic atherosclerosis. No pneumothorax. IMPRESSION: 1. Cardiomegaly with central vascular congestion and small loculated right pleural effusion. 2. Hazy atelectasis or infiltrate at the right base. Electronically Signed   By: Donavan Foil M.D.   On: 02/03/2019 14:46   Dg Pelvis 1-2 Views  Result Date: 02/03/2019 CLINICAL DATA:  [place] at home EXAM: PELVIS - 1-2 VIEW COMPARISON:  CT 07/13/2017 FINDINGS: SI joints are non widened. Pubic symphysis and rami are intact. No fracture or malalignment. Vascular calcifications. IMPRESSION: No acute osseous abnormality. Electronically Signed   By: Donavan Foil M.D.   On: 02/03/2019 14:47   Dg Wrist Complete Left  Result Date: 02/03/2019 CLINICAL DATA:  Fall with deformity a EXAM: LEFT WRIST - COMPLETE 3+ VIEW COMPARISON:  06/01/2017 FINDINGS: Acute comminuted and impacted intra-articular distal radius fracture. No subluxation. Additional acute mildly comminuted fracture involving the distal ulna and ulnar styloid. IMPRESSION: Acute comminuted and impacted intra-articular distal radius fracture with additional acute mildly comminuted distal ulna fracture Electronically Signed   By: Donavan Foil M.D.   On: 02/03/2019 14:45   Ct Head Wo Contrast  Result Date: 02/03/2019 CLINICAL DATA:  Cervical spine trauma. EXAM: CT HEAD WITHOUT CONTRAST CT CERVICAL SPINE WITHOUT CONTRAST TECHNIQUE: Multidetector CT imaging of the head and cervical spine was performed following the standard protocol without intravenous contrast. Multiplanar CT image reconstructions of the cervical spine were also generated. COMPARISON:  Head CT August 10, 2017 FINDINGS: CT HEAD FINDINGS Brain: No evidence of acute infarction, hemorrhage, hydrocephalus, extra-axial collection or mass lesion/mass effect. There is chronic diffuse  atrophy. Vascular: No hyperdense vessel or unexpected calcification. Skull: Normal. Negative for fracture or focal lesion. Sinuses/Orbits: No acute finding. Other: None. CT CERVICAL SPINE FINDINGS Alignment: Normal. Skull base and vertebrae: No acute fracture. No primary bone lesion or focal pathologic process. Soft tissues and spinal canal: No prevertebral fluid or swelling. No visible canal hematoma. Disc levels: Mild degenerative joint changes with osteophytosis are identified in the mid to lower cervical spine. Upper chest: Negative. Other: None. IMPRESSION: No focal acute intracranial abnormality identified. No acute fracture or dislocation of cervical spine. Electronically Signed   By: Abelardo Diesel M.D.   On: 02/03/2019 14:24   Ct Cervical Spine Wo Contrast  Result Date: 02/03/2019 CLINICAL DATA:  Cervical spine trauma. EXAM: CT HEAD WITHOUT CONTRAST CT CERVICAL  SPINE WITHOUT CONTRAST TECHNIQUE: Multidetector CT imaging of the head and cervical spine was performed following the standard protocol without intravenous contrast. Multiplanar CT image reconstructions of the cervical spine were also generated. COMPARISON:  Head CT August 10, 2017 FINDINGS: CT HEAD FINDINGS Brain: No evidence of acute infarction, hemorrhage, hydrocephalus, extra-axial collection or mass lesion/mass effect. There is chronic diffuse atrophy. Vascular: No hyperdense vessel or unexpected calcification. Skull: Normal. Negative for fracture or focal lesion. Sinuses/Orbits: No acute finding. Other: None. CT CERVICAL SPINE FINDINGS Alignment: Normal. Skull base and vertebrae: No acute fracture. No primary bone lesion or focal pathologic process. Soft tissues and spinal canal: No prevertebral fluid or swelling. No visible canal hematoma. Disc levels: Mild degenerative joint changes with osteophytosis are identified in the mid to lower cervical spine. Upper chest: Negative. Other: None. IMPRESSION: No focal acute intracranial  abnormality identified. No acute fracture or dislocation of cervical spine. Electronically Signed   By: Abelardo Diesel M.D.   On: 02/03/2019 14:24    Procedures Procedures (including critical care time)  Medications Ordered in ED Medications  midodrine (PROAMATINE) tablet 10 mg (has no administration in time range)  sodium chloride flush (NS) 0.9 % injection 3 mL (has no administration in time range)  acetaminophen (TYLENOL) tablet 1,000 mg (has no administration in time range)    Or  acetaminophen (TYLENOL) suppository 650 mg (has no administration in time range)  amiodarone (PACERONE) tablet 200 mg (has no administration in time range)  atorvastatin (LIPITOR) tablet 20 mg (has no administration in time range)  calcium acetate (PHOSLO) capsule 667 mg (has no administration in time range)  DULoxetine (CYMBALTA) DR capsule 60 mg (has no administration in time range)  gabapentin (NEURONTIN) capsule 200 mg (has no administration in time range)  oxyCODONE (Oxy IR/ROXICODONE) immediate release tablet 5 mg (has no administration in time range)  warfarin (COUMADIN) tablet 2.5 mg (has no administration in time range)  Warfarin - Pharmacist Dosing Inpatient (has no administration in time range)  sodium chloride 0.9 % bolus 500 mL (0 mLs Intravenous Stopped 02/03/19 1640)  acetaminophen (TYLENOL) tablet 650 mg (650 mg Oral Given 02/03/19 1640)  sodium chloride 0.9 % bolus 250 mL (0 mLs Intravenous Stopped 02/03/19 1940)  sodium chloride 0.9 % bolus 500 mL (500 mLs Intravenous New Bag/Given 02/03/19 1941)     Initial Impression / Assessment and Plan / ED Course  I have reviewed the triage vital signs and the nursing notes.  Pertinent labs & imaging results that were available during my care of the patient were reviewed by me and considered in my medical decision making (see chart for details).    Lactic within normal limits CBG 84 Lipase within normal limits APTT and PT/INR elevated, patient is  on Coumadin CMP appears baseline elevated creatinine on dialysis patient CBC appears baseline EKG:  Atrial fibrillation Right bundle branch block Baseline wander in lead(s) I No significant change since last tracing Confirmed by Gareth Morgan  DG pelvis IMPRESSION:  No acute osseous abnormality.   DG chest IMPRESSION:  1. Cardiomegaly with central vascular congestion and small loculated  right pleural effusion.  2. Hazy atelectasis or infiltrate at the right base.   DG wrist left IMPRESSION:  Acute comminuted and impacted intra-articular distal radius fracture  with additional acute mildly comminuted distal ulna fracture   CT head and cervical spine IMPRESSION:  No focal acute intracranial abnormality identified.    No acute fracture or dislocation of cervical spine.  ----------------------- C-collar  removed.  Physical examination significant for left wrist deformity.  Neurovascular intact to all 4 extremities.  No sign of head/neck, chest/abdomen or back injury.  Consulted hand orthopedics PA, advises splint and follow-up with Dr. Levell July office next week.  Patient additionally with possible right lung base pneumonia, endorses cough without fever over the past few days. - Patient reassessed multiple times here in emergency department, alert and oriented responsive appropriately. - Patient has remained hypotensive here today he has not had his Midodrin and patient with baseline low blood pressures.  He has been given 2 small fluid boluses today he has maintained his mentation here in the emergency department.  Suspect hypotension secondary to dialysis and lack of Midodrin today low suspicion for sepsis at this time.  Patient seen and evaluated by Dr. Billy Fischer today we will seek admission for this patient secondary to hypotension, fracture, possible pneumonia.  IV antibiotics have been started. - Patient has been admitted to hospitalist service.   Note: Portions of this  report may have been transcribed using voice recognition software. Every effort was made to ensure accuracy; however, inadvertent computerized transcription errors may still be present. Final Clinical Impressions(s) / ED Diagnoses   Final diagnoses:  Closed fracture of left wrist, initial encounter    ED Discharge Orders    None       Gari Crown 02/03/19 Michael Boston, MD 02/04/19 [phone]

## 2019-02-03 NOTE — ED Notes (Addendum)
MD Posey Pronto at bedside. Instructed to d/c antibiotics and give 500 mL fluid bolus over 2 hours. MD aware of patient's BP that is chronically low, would like MAP >55 at this time.   MD discontinued 2nd blood culture.

## 2019-02-03 NOTE — ED Notes (Signed)
Ortho tech at bedside placing splint and sling. Pt tolerated well.

## 2019-02-03 NOTE — ED Notes (Signed)
ED TO INPATIENT HANDOFF REPORT  ED Nurse Name and Phone #:  Threasa Beards [phone]  S Name/Age/Gender Michael [name] 58 y.o. male Room/Bed: 037C/037C  Code Status   Code Status: Prior  Home/SNF/Other Home Patient oriented to: self, place, time and situation Is this baseline? Yes   Triage Complete: Triage complete  Chief Complaint FALL  Triage Note Pt fell at home in kitchen , usually walks with a cane  , landed on left wrist and hand , has splint per ems has swelling and pain , did not lose conscious but due to language barrier they do not know if he hit his head , pt is dialysis last yesterday, bp is soft at 92/52  and is on coumadin has 22 left hand given 2 divided doses of fentanyl of 50 each total 100 given 250 fluid, HAS GOOD RADIAL PULSE LEFT, Can wiggle fingers, < sec cap refrill   Allergies Allergies  Allergen Reactions  . Triamcinolone Other (See Comments)    Felt like skin was burning    Level of Care/Admitting Diagnosis ED Disposition    ED Disposition Condition [place]: [place] [[id number]]  Level of Care: Progressive [102]  I expect the patient will be discharged within 24 hours: Yes  LOW acuity---Tx typically complete <24 hrs---ACUTE conditions typically can be evaluated <24 hours---LABS likely to return to acceptable levels <24 hours---IS near functional baseline---EXPECTED to return to current living arrangement---NOT newly hypoxic: Meets criteria for 5C-Observation unit  Diagnosis: Fall [290176]  Admitting Physician: Lenore Cordia [[id number]]  Attending Physician: Lenore Cordia [[id number]]  PT Class (Do Not Modify): Observation [104]  PT Acc Code (Do Not Modify): Observation [10022]       B Medical/Surgery History Past Medical History:  Diagnosis Date  . Abdominal pain 02/25/2016  . Acute encephalopathy 08/06/2018  . Acute hypoxemic respiratory failure ([place]) 12/29/2015  . Advance care planning   . Anemia -  mild exacerbation 10/15/2015  . Anemia of chronic disease   . Aortic regurgitation 02/27/2016  . Asthma    said he does not know  . Atrial fibrillation with RVR ([place]) 02/19/2016  . Calf pain 09/10/2015  . Chronic kidney disease (CKD), stage IV (severe) ([place])   . Chronic systolic CHF (congestive heart failure) (HCC)    a. prior EF normal; b. Echo 10/16: Mild LVH, EF 30-35%, mitral valve bioprosthesis present without evidence of stenosis or regurgitation, severe LAE, mild RVE with mild to moderately reduced RVSF, moderate RAE  . Cirrhosis ([place])   . Congestive heart failure (CHF) ([place]) 02/25/2016  . Coronary artery disease   . Diabetes mellitus type 2 in obese ([place])   . Diabetes mellitus without complication ([place])   . Dilated cardiomyopathy ([place]) 12/24/2015  . Dyspnea   . Elevated troponin 11/24/2017  . ESRD (end stage renal disease) on dialysis ([place])   . Fluid overload 11/24/2017  . GERD (gastroesophageal reflux disease)   . Gout 10/23/2015  . H/O tricuspid valve repair 2012  . History of echocardiogram    E[facility] 4/12: mod LVH, EF 60-65%, mod LAE, tissue MVR ok, mod TR, mod pulmo HTN with RVSP 48 mmHg  . HLD (hyperlipidemia) 10/05/2016  . Hyperkalemia 11/24/2017  . Hypertension   . Mitral valve disease   . Obstructive sleep apnea   . Pancreatitis, ruled out 02/25/2016  . Paroxysmal atrial fibrillation (HCC)    s/p Maze procedure at time of MVR in 2011  .  Permanent atrial fibrillation 02/25/2016  . Pleural effusion   . Polypharmacy 11/23/2015  . Pulmonary HTN ([place])   . 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 Septic shock ([place])   . Syncope and collapse 08/10/2017  . Thrombocytopenia ([place])   . Viral gastroenteritis 02/27/2016   Past Surgical History:  Procedure Laterality Date  . AV FISTULA PLACEMENT Left 07/13/2017   Procedure: LEFT ARM ARTERIOVENOUS (AV) FISTULA CREATION;  Surgeon: Waynetta Sandy, MD;  Location: [place];  Service: Vascular;   Laterality: Left;  . BASCILIC VEIN TRANSPOSITION Left 11/03/2017   Procedure: BASILIC VEIN TRANSPOSITION SECOND STAGE;  Surgeon: Waynetta Sandy, MD;  Location: [place];  Service: Vascular;  Laterality: Left;  . CARDIAC SURGERY    . CARDIAC VALVE REPLACEMENT  2011  . CARDIOVERSION N/A 07/09/2017   Procedure: CARDIOVERSION;  Surgeon: Larey Dresser, MD;  Location: [facility] ENDOSCOPY;  Service: Cardiovascular;  Laterality: N/A;  . COLONOSCOPY N/A 07/16/2017   Procedure: COLONOSCOPY;  Surgeon: Jerene Bears, MD;  Location: [place];  Service: Gastroenterology;  Laterality: N/A;  . ESOPHAGOGASTRODUODENOSCOPY N/A 07/16/2017   Procedure: ESOPHAGOGASTRODUODENOSCOPY (EGD);  Surgeon: Jerene Bears, MD;  Location: [facility] ENDOSCOPY;  Service: Gastroenterology;  Laterality: N/A;  . INSERTION OF DIALYSIS CATHETER Right 07/13/2017   Procedure: INSERTION OF DIALYSIS CATHETER RIGHT INTERNAL JUGULAR;  Surgeon: Waynetta Sandy, MD;  Location: [place];  Service: Vascular;  Laterality: Right;  . RIGHT HEART CATH N/A 06/29/2017   Procedure: RIGHT HEART CATH;  Surgeon: Larey Dresser, MD;  Location: [place] CV LAB;  Service: Cardiovascular;  Laterality: N/A;  . RIGHT/LEFT HEART CATH AND CORONARY ANGIOGRAPHY N/A 08/19/2017   Procedure: RIGHT/LEFT HEART CATH AND CORONARY ANGIOGRAPHY;  Surgeon: Leonie Man, MD;  Location: [place] CV LAB;  Service: Cardiovascular;  Laterality: N/A;  . TEE WITHOUT CARDIOVERSION N/A 07/09/2017   Procedure: TRANSESOPHAGEAL ECHOCARDIOGRAM (TEE);  Surgeon: Larey Dresser, MD;  Location[facility] ENDOSCOPY;  Service: Cardiovascular;  Laterality: N/A;     A IV Location/Drains/Wounds Patient Lines/Drains/Airways Status   Active Line/Drains/Airways    Name:   Placement date:   Placement time:   Site:   Days:   Peripheral IV 02/03/19 Right Forearm   02/03/19    1519    Forearm   less than 1   Fistula / Graft Left Upper arm Arteriovenous fistula   11/03/17    1116    Upper arm    457   Incision (Closed) 11/03/17 Arm Left   11/03/17    1024     457          Intake/Output Last 24 hours  Intake/Output Summary (Last 24 hours) at 02/03/2019 1922 Last data filed at 02/03/2019 1313 Gross per 24 hour  Intake 250 ml  Output -  Net 250 ml    Labs/Imaging Results for orders placed or performed during the hospital encounter of 02/03/19 (from the past 48 hour(s))  CBC with Differential     Status: Abnormal   Collection Time: 02/03/19  1:50 PM  Result Value Ref Range   WBC 6.5 4.0 - 10.5 K/uL   RBC 4.33 4.22 - 5.81 MIL/uL   Hemoglobin 11.5 (L) 13.0 - 17.0 g/dL   HCT 36.3 (L) 39.0 - 52.0 %   MCV 83.8 80.0 - 100.0 fL   MCH 26.6 26.0 - 34.0 pg   MCHC 31.7 30.0 - 36.0 g/dL   RDW 16.9 (H) 11.5 - 15.5 %  Platelets 136 (L) 150 - 400 K/uL    Comment: REPEATED TO VERIFY   nRBC 0.0 0.0 - 0.2 %   Neutrophils Relative % 63 %   Neutro Abs 4.1 1.7 - 7.7 K/uL   Lymphocytes Relative 17 %   Lymphs Abs 1.1 0.7 - 4.0 K/uL   Monocytes Relative 14 %   Monocytes Absolute 0.9 0.1 - 1.0 K/uL   Eosinophils Relative 5 %   Eosinophils Absolute 0.3 0.0 - 0.5 K/uL   Basophils Relative 1 %   Basophils Absolute 0.1 0.0 - 0.1 K/uL   Immature Granulocytes 0 %   Abs Immature Granulocytes 0.02 0.00 - 0.07 K/uL    Comment: Performed at [facility] [address], [place], [place] [address]  Protime-INR     Status: Abnormal   Collection Time: 02/03/19  1:50 PM  Result Value Ref Range   Prothrombin Time 27.7 (H) 11.4 - 15.2 seconds   INR 2.6 (H) 0.8 - 1.2    Comment: (NOTE) INR goal varies based on device and disease states. Performed at [facility], [place] [address] [place], [place] [address]   APTT     Status: Abnormal   Collection Time: 02/03/19  1:50 PM  Result Value Ref Range   aPTT 52 (H) 24 - 36 seconds    Comment:        IF BASELINE aPTT IS ELEVATED, SUGGEST PATIENT RISK ASSESSMENT BE USED TO DETERMINE APPROPRIATE ANTICOAGULANT THERAPY. Performed at [facility], [place] [address], [place], [place] [address]   Lipase, blood     Status: None   Collection Time: 02/03/19  1:50 PM  Result Value Ref Range   Lipase 39 11 - 51 U/L    Comment: Performed at [facility] [address], [place], [place] [address]  Comprehensive metabolic panel     Status: Abnormal   Collection Time: 02/03/19  2:16 PM  Result Value Ref Range   Sodium 132 (L) 135 - 145 mmol/L   Potassium 3.8 3.5 - 5.1 mmol/L   Chloride 96 (L) 98 - 111 mmol/L   CO2 27 22 - 32 mmol/L   Glucose, Bld 93 70 - 99 mg/dL   BUN 19 6 - 20 mg/dL   Creatinine, Ser 5.59 (H) 0.61 - 1.24 mg/dL   Calcium 9.1 8.9 - 10.3 mg/dL   Total Protein 8.5 (H) 6.5 - 8.1 g/dL   Albumin 3.5 3.5 - 5.0 g/dL   AST 40 15 - 41 U/L   ALT 26 0 - 44 U/L   Alkaline Phosphatase 162 (H) 38 - 126 U/L   Total Bilirubin 1.4 (H) 0.3 - 1.2 mg/dL   GFR calc non Af Amer 10 (L) >60 mL/min   GFR calc Af Amer 12 (L) >60 mL/min   Anion gap 9 5 - 15    Comment: Performed at [facility], [place] [address], [place], [place] [address]  CBG monitoring, ED     Status: None   Collection Time: 02/03/19  2:52 PM  Result Value Ref Range   Glucose-Capillary 84 70 - 99 mg/dL   Dg Chest 1 View  Result Date: 02/03/2019 CLINICAL DATA:  [place] at home EXAM: CHEST  1 VIEW COMPARISON:  09/13/2018 FINDINGS: Post sternotomy changes. Marked cardiomegaly. Small mildly loculated right pleural effusion. Overall decreased compared to prior. Aortic atherosclerosis. No pneumothorax. IMPRESSION: 1. Cardiomegaly with central vascular congestion and small loculated right pleural effusion. 2. Hazy  atelectasis or infiltrate at the right base. Electronically Signed   By: Donavan Foil M.D.   On: 02/03/2019 14:46   Dg Pelvis 1-2 Views  Result Date: 02/03/2019 CLINICAL DATA:  [place] at home EXAM: PELVIS - 1-2 VIEW COMPARISON:  CT 07/13/2017 FINDINGS: SI joints are non widened. Pubic symphysis and rami are intact. No fracture or malalignment.  Vascular calcifications. IMPRESSION: No acute osseous abnormality. Electronically Signed   By: Donavan Foil M.D.   On: 02/03/2019 14:47   Dg Wrist Complete Left  Result Date: 02/03/2019 CLINICAL DATA:  Fall with deformity a EXAM: LEFT WRIST - COMPLETE 3+ VIEW COMPARISON:  06/01/2017 FINDINGS: Acute comminuted and impacted intra-articular distal radius fracture. No subluxation. Additional acute mildly comminuted fracture involving the distal ulna and ulnar styloid. IMPRESSION: Acute comminuted and impacted intra-articular distal radius fracture with additional acute mildly comminuted distal ulna fracture Electronically Signed   By: Donavan Foil M.D.   On: 02/03/2019 14:45   Ct Head Wo Contrast  Result Date: 02/03/2019 CLINICAL DATA:  Cervical spine trauma. EXAM: CT HEAD WITHOUT CONTRAST CT CERVICAL SPINE WITHOUT CONTRAST TECHNIQUE: Multidetector CT imaging of the head and cervical spine was performed following the standard protocol without intravenous contrast. Multiplanar CT image reconstructions of the cervical spine were also generated. COMPARISON:  Head CT August 10, 2017 FINDINGS: CT HEAD FINDINGS Brain: No evidence of acute infarction, hemorrhage, hydrocephalus, extra-axial collection or mass lesion/mass effect. There is chronic diffuse atrophy. Vascular: No hyperdense vessel or unexpected calcification. Skull: Normal. Negative for fracture or focal lesion. Sinuses/Orbits: No acute finding. Other: None. CT CERVICAL SPINE FINDINGS Alignment: Normal. Skull base and vertebrae: No acute fracture. No primary bone lesion or focal pathologic process. Soft tissues and spinal canal: No prevertebral fluid or swelling. No visible canal hematoma. Disc levels: Mild degenerative joint changes with osteophytosis are identified in the mid to lower cervical spine. Upper chest: Negative. Other: None. IMPRESSION: No focal acute intracranial abnormality identified. No acute fracture or dislocation of cervical spine.  Electronically Signed   By: Abelardo Diesel M.D.   On: 02/03/2019 14:24   Ct Cervical Spine Wo Contrast  Result Date: 02/03/2019 CLINICAL DATA:  Cervical spine trauma. EXAM: CT HEAD WITHOUT CONTRAST CT CERVICAL SPINE WITHOUT CONTRAST TECHNIQUE: Multidetector CT imaging of the head and cervical spine was performed following the standard protocol without intravenous contrast. Multiplanar CT image reconstructions of the cervical spine were also generated. COMPARISON:  Head CT August 10, 2017 FINDINGS: CT HEAD FINDINGS Brain: No evidence of acute infarction, hemorrhage, hydrocephalus, extra-axial collection or mass lesion/mass effect. There is chronic diffuse atrophy. Vascular: No hyperdense vessel or unexpected calcification. Skull: Normal. Negative for fracture or focal lesion. Sinuses/Orbits: No acute finding. Other: None. CT CERVICAL SPINE FINDINGS Alignment: Normal. Skull base and vertebrae: No acute fracture. No primary bone lesion or focal pathologic process. Soft tissues and spinal canal: No prevertebral fluid or swelling. No visible canal hematoma. Disc levels: Mild degenerative joint changes with osteophytosis are identified in the mid to lower cervical spine. Upper chest: Negative. Other: None. IMPRESSION: No focal acute intracranial abnormality identified. No acute fracture or dislocation of cervical spine. Electronically Signed   By: Abelardo Diesel M.D.   On: 02/03/2019 14:24    Pending Labs Unresulted Labs (From admission, onward)    Start     Ordered   02/03/19 1820  Blood culture (routine x 2)  BLOOD CULTURE X 2,   STAT     02/03/19 1822   02/03/19 1820  Lactic acid, plasma  Now then every 2 hours,   STAT     02/03/19 1822   02/03/19 1416  Urinalysis, Routine w reflex microscopic  (ED ALOC)  Once,   STAT     02/03/19 1415          Vitals/Pain Today's Vitals   02/03/19 1720 02/03/19 1730 02/03/19 1800 02/03/19 1830  BP: (!) 79/49 (!) 86/46  (!) 80/48  Pulse:      Resp: (!) 24      Temp:      TempSrc:      SpO2:  (!) 66%  100%  Weight:   66.2 kg   PainSc:        Isolation Precautions No active isolations  Medications Medications  midodrine (PROAMATINE) tablet 10 mg (has no administration in time range)  ceFEPIme (MAXIPIME) 1 g in sodium chloride 0.9 % 100 mL IVPB (has no administration in time range)  vancomycin (VANCOCIN) 1,250 mg in sodium chloride 0.9 % 250 mL IVPB (1,250 mg Intravenous New Bag/Given 02/03/19 1901)  vancomycin (VANCOCIN) IVPB 750 mg/150 ml premix (has no administration in time range)  sodium chloride 0.9 % bolus 500 mL (0 mLs Intravenous Stopped 02/03/19 1640)  acetaminophen (TYLENOL) tablet 650 mg (650 mg Oral Given 02/03/19 1640)  sodium chloride 0.9 % bolus 250 mL (250 mLs Intravenous New Bag/Given 02/03/19 1744)    Mobility walks Moderate fall risk   Focused Assessments Pulmonary Assessment Handoff:  Lung sounds:   O2 Device: Nasal Cannula O2 Flow Rate (L/min): 3 L/min      R Recommendations: See Admitting Provider Note  Report given to:   Additional Notes:  Language barrier - [place]. Normally hypotensive, MD aware.

## 2019-02-03 NOTE — H&P (Addendum)
History and Physical    Michael [name] OHY:[id number] DOB: 1961-08-11 DOA: 02/03/2019  PCP: Charlott Rakes, MD  Patient coming from: Home  I have personally briefly reviewed patient's old medical records in [place]  Chief Complaint: Fall  HPI: Michael [name] is a 58 y.o. 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  Daughter is at bedside to assist with history.  Patient normally ambulates with a cane.  He was putting on his shoe earlier today when he lost his balance and fell forward.  He stretched his arm out to brace his fall and had immediate pain afterwards.  He denies hitting his head or loss of consciousness.  He is reporting continued pain in his left arm.  Patient reports going to his usual dialysis yesterday 02/02/2019 which she receives via aVF of his left arm.  Daughter states he is usually weak after dialysis sessions.  He denies any associated chest pain, lightheadedness, dizziness, palpitations, dyspnea.  He reports a chronic cough which is unchanged.  He denies any fevers, chills, diaphoresis.  ED Course:  Initial vitals in the ED showed BP 93/59, pulse 51, RR 14, temp 97.6 Fahrenheit, SPO2 93% on 2.5 L O2 via [place].  Labs are notable for WBC 6.5, hemoglobin 11.5, platelets 136, INR 2.6, sodium 132, potassium 3.8, BUN 19, creatinine 5.59, AST 40, ALT 26, alk phos 162, total bilirubin 1.4, lactic acid 1.3, lipase 39.  CT head and cervical spine without contrast were negative for acute intracranial abnormality or fracture or dislocation of the cervical spine.    Portable 1 view chest x-ray showed cardiomegaly with vascular congestion and small right pleural effusion.  Pelvic  x-ray was negative for acute osseous abnormality.  Left wrist x-ray showed an acute comminuted and impacted intra-articular distal radius fracture with additional acute mildly comminuted distal ulna fracture.  Hand surgery were consulted and placed patient in a left wrist splint with anticipation of nonsurgical management and nonweightbearing.  They recommend he follow up with Dr. Fredna Dow in office in 1 week.  Patient was given 750 mL normal saline and started on IV vancomycin and cefepime for questionable pneumonia.  The hospitalist service was consulted to admit for further management.  Review of Systems: As per HPI otherwise 10 point review of systems negative.    Past Medical History:  Diagnosis Date   Abdominal pain 02/25/2016   Acute encephalopathy 08/06/2018   Acute hypoxemic respiratory failure ([place]) 12/29/2015   Advance care planning    Anemia - mild exacerbation 10/15/2015   Anemia of chronic disease    Aortic regurgitation 02/27/2016   Asthma    said he does not know   Atrial fibrillation with RVR ([place]) 02/19/2016   Calf pain 09/10/2015   Chronic kidney disease (CKD), stage IV (severe) (HCC)    Chronic systolic CHF (congestive heart failure) ([place])    a. prior EF normal; b. Echo 10/16: Mild LVH, EF 30-35%, mitral valve bioprosthesis present without evidence of stenosis or regurgitation, severe LAE, mild RVE with mild to moderately reduced RVSF, moderate RAE   Cirrhosis (HCC)    Congestive heart failure (CHF) ([place]) 02/25/2016   Coronary artery disease    Diabetes mellitus type 2 in obese ([place])    Diabetes  mellitus without complication ([place])    Dilated cardiomyopathy ([place]) 12/24/2015   Dyspnea    Elevated troponin 11/24/2017   ESRD (end stage renal disease) on dialysis [facility])    Fluid overload 11/24/2017   GERD (gastroesophageal reflux disease)    Gout 10/23/2015   H/O tricuspid valve repair 2012   History of echocardiogram    Echo at [facility] in [facility] 4/12:  mod LVH, EF 60-65%, mod LAE, tissue MVR ok, mod TR, mod pulmo HTN with RVSP 48 mmHg   HLD (hyperlipidemia) 10/05/2016   Hyperkalemia 11/24/2017   Hypertension    Mitral valve disease    Obstructive sleep apnea    Pancreatitis, ruled out 02/25/2016   Paroxysmal atrial fibrillation ([place])    s/p Maze procedure at time of MVR in 2011   Permanent atrial fibrillation 02/25/2016   Pleural effusion    Polypharmacy 11/23/2015   Pulmonary HTN ([place])    S/P mitral valve replacement with porcine valve 2012   Septic shock ([place])    Syncope and collapse 08/10/2017   Thrombocytopenia ([place])    Viral gastroenteritis 02/27/2016    Past Surgical History:  Procedure Laterality Date   AV FISTULA PLACEMENT Left 07/13/2017   Procedure: LEFT ARM ARTERIOVENOUS (AV) FISTULA CREATION;  Surgeon: Waynetta Sandy, MD;  Location: [place];  Service: Vascular;  Laterality: Left;   [place] Left 11/03/2017   Procedure: BASILIC VEIN TRANSPOSITION SECOND STAGE;  Surgeon: Waynetta Sandy, MD;  Location: [place];  Service: Vascular;  Laterality: Left;   [place]   CARDIOVERSION N/A 07/09/2017   Procedure: CARDIOVERSION;  Surgeon: Larey Dresser, MD;  Location: [facility] ENDOSCOPY;  Service: Cardiovascular;  Laterality: N/A;   COLONOSCOPY N/A 07/16/2017   Procedure: COLONOSCOPY;  Surgeon: Jerene Bears, MD;  Location: [facility] ENDOSCOPY;  Service: Gastroenterology;  Laterality: N/A;   ESOPHAGOGASTRODUODENOSCOPY N/A 07/16/2017   Procedure: ESOPHAGOGASTRODUODENOSCOPY (EGD);  Surgeon: Jerene Bears, MD;  Location: [facility] ENDOSCOPY;  Service: Gastroenterology;  Laterality: N/A;   INSERTION OF DIALYSIS CATHETER Right 07/13/2017   Procedure: INSERTION OF DIALYSIS CATHETER RIGHT INTERNAL JUGULAR;  Surgeon: Waynetta Sandy, MD;  Location: [place];  Service: Vascular;  Laterality: Right;   RIGHT HEART CATH N/A 06/29/2017   Procedure: RIGHT HEART  CATH;  Surgeon: Larey Dresser, MD;  Location: [place] CV LAB;  Service: Cardiovascular;  Laterality: N/A;   RIGHT/LEFT HEART CATH AND CORONARY ANGIOGRAPHY N/A 08/19/2017   Procedure: RIGHT/LEFT HEART CATH AND CORONARY ANGIOGRAPHY;  Surgeon: Leonie Man, MD;  Location: [place] CV LAB;  Service: Cardiovascular;  Laterality: N/A;   TEE WITHOUT CARDIOVERSION N/A 07/09/2017   Procedure: TRANSESOPHAGEAL ECHOCARDIOGRAM (TEE);  Surgeon: Larey Dresser, MD;  Location: [facility] ENDOSCOPY;  Service: Cardiovascular;  Laterality: N/A;     reports that he has quit smoking. His smoking use included cigarettes. He smoked 1.00 pack per day. He has never used smokeless tobacco. He reports that he does not drink alcohol or use drugs.  Allergies  Allergen Reactions   Triamcinolone Other (See Comments)    Felt like skin was burning    Family History  Problem Relation Age of Onset   Heart attack Neg Hx      Prior to Admission medications   Medication Sig Start Date End Date Taking? Authorizing Provider  amiodarone (PACERONE) 200 MG tablet TAKE 1 TABLET BY MOUTH TWICE A DAY Patient taking differently: Take 200 mg by mouth 2 (  two) times daily.  07/14/18  Yes Fay Records, MD  atorvastatin (LIPITOR) 20 MG tablet TAKE 1 TABLET (20 MG TOTAL) BY MOUTH DAILY. PLEASE KEEP UPCOMING APPT. THANK YOU 01/14/19  Yes Fay Records, MD  B Complex-C-Zn-Folic Acid (DIALYVITE/ZINC) TABS Take 1 tablet by mouth daily. 09/15/18  Yes [provider]  baclofen (LIORESAL) 10 MG tablet Take 0.5 tablets (5 mg total) by mouth 3 (three) times daily as needed for muscle spasms. 12/21/18  Yes Hilts, Legrand Como, MD  calcium acetate (PHOSLO) 667 MG capsule Take 1 capsule (667 mg total) by mouth 3 (three) times daily with meals. Please keep upcoming appt. Thank You 03/09/18  Yes Fay Records, MD  cetirizine (ZYRTEC) 10 MG tablet Take 1 tablet (10 mg total) by mouth daily. 12/09/18  Yes Argentina Donovan, PA-C  colchicine 0.6  MG tablet Take 0.5 tablets (0.3 mg total) by mouth 2 (two) times a week. 09/21/17  Yes Tawny Asal, MD  diazepam (VALIUM) 5 MG tablet Take 5 mg by mouth at bedtime as needed (sleep).   Yes [provider]  diphenhydramine-acetaminophen (TYLENOL PM) 25-500 MG TABS tablet Take 1 tablet by mouth at bedtime as needed (pain).   Yes [provider]  DULoxetine (CYMBALTA) 60 MG capsule Take 1 capsule (60 mg total) by mouth daily. For back pain 01/06/19  Yes Newlin, Enobong, MD  fluticasone (FLONASE) 50 MCG/ACT nasal spray Place 2 sprays into both nostrils daily. 12/14/17  Yes Bonnielee Haff, MD  folic acid (FOLVITE) 1 MG tablet TAKE 1 TABLET BY MOUTH EVERY DAY Patient taking differently: Take 1 mg by mouth daily.  11/22/18  Yes Charlott Rakes, MD  gabapentin (NEURONTIN) 100 MG capsule Take 200 mg by mouth at bedtime. FOR LEG PAIN   Yes [provider]  lidocaine (XYLOCAINE) 2 % solution Swish and spit 15 ml Q 6 hrs PRN 09/14/18  Yes Ladell Pier, MD  metoprolol tartrate (LOPRESSOR) 25 MG tablet Take 1 tablet (25 mg total) by mouth 2 (two) times daily. Patient taking differently: Take 12.5 mg by mouth 2 (two) times daily.  07/22/18  Yes Daune Perch, NP  midodrine (PROAMATINE) 10 MG tablet Take 5 mg by mouth 3 (three) times daily with meals.  12/21/18  Yes [provider]  multivitamin (RENA-VIT) TABS tablet Take 1 tablet by mouth at bedtime. 12/14/17  Yes Bonnielee Haff, MD  OXYGEN Inhale 2 L into the lungs daily as needed (shortness of breath).    Yes [provider]  traZODone (DESYREL) 100 MG tablet Take 1 tablet (100 mg total) by mouth at bedtime. for sleep 01/06/19  Yes Charlott Rakes, MD  warfarin (COUMADIN) 2.5 MG tablet TAKE AS DIRECTED BY COUMADIN CLINIC Patient taking differently: Take 2.5 mg by mouth daily. TAKE  AS DIRECTED BY COUMADIN CLINIC 01/17/19  Yes Jerline Pain, MD  ondansetron (ZOFRAN-ODT) 4 MG disintegrating tablet Take 1 tablet (4 mg  total) by mouth every 8 (eight) hours as needed for nausea or vomiting. Patient not taking: Reported on 02/03/2019 12/09/18   Argentina Donovan, PA-C  traMADol (ULTRAM) 50 MG tablet Take 1 tablet (50 mg total) by mouth every 8 (eight) hours as needed. Patient not taking: Reported on 02/03/2019 12/09/18   Mathis Dad    Physical Exam: Vitals:   02/03/19 1720 02/03/19 1730 02/03/19 1800 02/03/19 1830  BP: (!) 79/49 (!) 86/46  (!) 80/48  Pulse:      Resp: (!) 24  Temp:      TempSrc:      SpO2:  (!) 66%  100%  Weight:   66.2 kg     Constitutional: Chronically ill-appearing man sitting up in bed, NAD, calm Eyes: PERRL, lids and conjunctivae normal ENMT: Mucous membranes are dry. Posterior pharynx clear of any exudate or lesions.Normal dentition.  Neck: normal, supple, no masses. Respiratory: clear to auscultation bilaterally, no wheezing, no crackles. Normal respiratory effort. No accessory muscle use.  Cardiovascular: Bradycardic, no murmurs / rubs / gallops. No extremity edema.  Abdomen: no tenderness, no masses palpated. No hepatosplenomegaly. Bowel sounds positive.  Musculoskeletal: Left forearm and wrist splint and sling, aVF left upper extremity Skin: Petechial rash both legs Neurologic: CN 2-12 grossly intact. Sensation intact,  Strength 5/5 in all 4.  Psychiatric: Normal judgment and insight. Alert and oriented x 3. Normal mood.    Labs on Admission: I have personally reviewed following labs and imaging studies  CBC: Recent Labs  Lab 02/03/19 1350  WBC 6.5  NEUTROABS 4.1  HGB 11.5*  HCT 36.3*  MCV 83.8  PLT 161*   Basic Metabolic Panel: Recent Labs  Lab 02/03/19 1416  NA 132*  K 3.8  CL 96*  CO2 27  GLUCOSE 93  BUN 19  CREATININE 5.59*  CALCIUM 9.1   GFR: Estimated Creatinine Clearance: 12.4 mL/min (A) (by C-G formula based on SCr of 5.59 mg/dL (H)). Liver Function Tests: Recent Labs  Lab 02/03/19 1416  AST 40  ALT 26  ALKPHOS 162*    BILITOT 1.4*  PROT 8.5*  ALBUMIN 3.5   Recent Labs  Lab 02/03/19 1350  LIPASE 39   No results for input(s): AMMONIA in the last 168 hours. Coagulation Profile: Recent Labs  Lab 01/31/19 02/03/19 1350  INR 4.4* 2.6*   Cardiac Enzymes: No results for input(s): CKTOTAL, CKMB, CKMBINDEX, TROPONINI in the last 168 hours. BNP (last 3 results) No results for input(s): PROBNP in the last 8760 hours. HbA1C: No results for input(s): HGBA1C in the last 72 hours. CBG: Recent Labs  Lab 02/03/19 1452  GLUCAP 84   Lipid Profile: No results for input(s): CHOL, HDL, LDLCALC, TRIG, CHOLHDL, LDLDIRECT in the last 72 hours. Thyroid Function Tests: No results for input(s): TSH, T4TOTAL, FREET4, T3FREE, THYROIDAB in the last 72 hours. Anemia Panel: No results for input(s): VITAMINB12, FOLATE, FERRITIN, TIBC, IRON, RETICCTPCT in the last 72 hours. Urine analysis:    Component Value Date/Time   COLORURINE AMBER (A) 08/11/2018 1943   APPEARANCEUR CLOUDY (A) 08/11/2018 1943   LABSPEC 1.021 08/11/2018 1943   PHURINE 5.0 08/11/2018 1943   GLUCOSEU NEGATIVE 08/11/2018 1943   HGBUR LARGE (A) 08/11/2018 [place] NEGATIVE 08/11/2018 1943   KETONESUR 5 (A) 08/11/2018 1943   PROTEINUR 100 (A) 08/11/2018 1943   NITRITE NEGATIVE 08/11/2018 1943   LEUKOCYTESUR MODERATE (A) 08/11/2018 1943    Radiological Exams on Admission: Dg Chest 1 View  Result Date: 02/03/2019 CLINICAL DATA:  [place] at home EXAM: CHEST  1 VIEW COMPARISON:  09/13/2018 FINDINGS: Post sternotomy changes. Marked cardiomegaly. Small mildly loculated right pleural effusion. Overall decreased compared to prior. Aortic atherosclerosis. No pneumothorax. IMPRESSION: 1. Cardiomegaly with central vascular congestion and small loculated right pleural effusion. 2. Hazy atelectasis or infiltrate at the right base. Electronically Signed   By: Donavan Foil M.D.   On: 02/03/2019 14:46   Dg Pelvis 1-2 Views  Result Date:  02/03/2019 CLINICAL DATA:  [place] at home EXAM: PELVIS - 1-2  VIEW COMPARISON:  CT 07/13/2017 FINDINGS: SI joints are non widened. Pubic symphysis and rami are intact. No fracture or malalignment. Vascular calcifications. IMPRESSION: No acute osseous abnormality. Electronically Signed   By: Donavan Foil M.D.   On: 02/03/2019 14:47   Dg Wrist Complete Left  Result Date: 02/03/2019 CLINICAL DATA:  Fall with deformity a EXAM: LEFT WRIST - COMPLETE 3+ VIEW COMPARISON:  06/01/2017 FINDINGS: Acute comminuted and impacted intra-articular distal radius fracture. No subluxation. Additional acute mildly comminuted fracture involving the distal ulna and ulnar styloid. IMPRESSION: Acute comminuted and impacted intra-articular distal radius fracture with additional acute mildly comminuted distal ulna fracture Electronically Signed   By: Donavan Foil M.D.   On: 02/03/2019 14:45   Ct Head Wo Contrast  Result Date: 02/03/2019 CLINICAL DATA:  Cervical spine trauma. EXAM: CT HEAD WITHOUT CONTRAST CT CERVICAL SPINE WITHOUT CONTRAST TECHNIQUE: Multidetector CT imaging of the head and cervical spine was performed following the standard protocol without intravenous contrast. Multiplanar CT image reconstructions of the cervical spine were also generated. COMPARISON:  Head CT August 10, 2017 FINDINGS: CT HEAD FINDINGS Brain: No evidence of acute infarction, hemorrhage, hydrocephalus, extra-axial collection or mass lesion/mass effect. There is chronic diffuse atrophy. Vascular: No hyperdense vessel or unexpected calcification. Skull: Normal. Negative for fracture or focal lesion. Sinuses/Orbits: No acute finding. Other: None. CT CERVICAL SPINE FINDINGS Alignment: Normal. Skull base and vertebrae: No acute fracture. No primary bone lesion or focal pathologic process. Soft tissues and spinal canal: No prevertebral fluid or swelling. No visible canal hematoma. Disc levels: Mild degenerative joint changes with osteophytosis are  identified in the mid to lower cervical spine. Upper chest: Negative. Other: None. IMPRESSION: No focal acute intracranial abnormality identified. No acute fracture or dislocation of cervical spine. Electronically Signed   By: Abelardo Diesel M.D.   On: 02/03/2019 14:24   Ct Cervical Spine Wo Contrast  Result Date: 02/03/2019 CLINICAL DATA:  Cervical spine trauma. EXAM: CT HEAD WITHOUT CONTRAST CT CERVICAL SPINE WITHOUT CONTRAST TECHNIQUE: Multidetector CT imaging of the head and cervical spine was performed following the standard protocol without intravenous contrast. Multiplanar CT image reconstructions of the cervical spine were also generated. COMPARISON:  Head CT August 10, 2017 FINDINGS: CT HEAD FINDINGS Brain: No evidence of acute infarction, hemorrhage, hydrocephalus, extra-axial collection or mass lesion/mass effect. There is chronic diffuse atrophy. Vascular: No hyperdense vessel or unexpected calcification. Skull: Normal. Negative for fracture or focal lesion. Sinuses/Orbits: No acute finding. Other: None. CT CERVICAL SPINE FINDINGS Alignment: Normal. Skull base and vertebrae: No acute fracture. No primary bone lesion or focal pathologic process. Soft tissues and spinal canal: No prevertebral fluid or swelling. No visible canal hematoma. Disc levels: Mild degenerative joint changes with osteophytosis are identified in the mid to lower cervical spine. Upper chest: Negative. Other: None. IMPRESSION: No focal acute intracranial abnormality identified. No acute fracture or dislocation of cervical spine. Electronically Signed   By: Abelardo Diesel M.D.   On: 02/03/2019 14:24    EKG: Independently reviewed.  A. fib, rate 54 bpm, RBBB  Assessment/Plan Principal Problem:   Fall Active Problems:   Type II diabetes mellitus with renal manifestations (HCC)   Essential hypertension   Chronic atrial fibrillation   Congestive heart failure (CHF) (HCC)   HLD (hyperlipidemia)   ESRD (end stage renal  disease) on dialysis (HCC)   Left wrist fracture, closed, initial encounter   Hypotension  Yianni Mccaughey is a 58 y.o. 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   Fall with left wrist fracture: Appears to be mechanical in nature.  Hand surgery has seen the patient in place a left wrist splint and recommend outpatient follow-up with Dr. Fredna Dow in 1 week.  They anticipate nonsurgical management. -Continue with splint and nonweightbearing -Follow-up with hand surgery outpatient 1 week -PT/OT consult -Pain control  Hypotension: Patient has chronic hypotension and is on midodrine as an outpatient.  His blood pressure has been lower than usual on admission.  He has not had his midodrine today.  EDP was concerned about pneumonia, however patient is without fever, leukocytosis, and chest x-ray findings suggestive more of vascular congestion and pleural effusion likely secondary to ESRD and CHF.  He reports a chronic cough which is unchanged. -Give additional small bolus fluids now, strict I/O's -Restart midodrine -Hold further antibiotics -Hold home midodrine  ESRD on MWF HD: Volume status appears stable to slightly depleted.  No emergent need for dialysis.  He receives dialysis via aVF of his LUE, left wrist is now in a splint as above. -Nephrology consult in a.m. for HD if remaining inpatient  Chronic atrial fibrillation s/p Maze; s/p MVR w/ porcine valve: Bradycardic on admission. -Continue Coumadin -Holding amiodarone and metoprolol as above with bradycardia and hypotension  Type 2 diabetes: Diet controlled.  Serum glucose 93 on admission. -Continue to  monitor  Chronic combined systolic and diastolic CHF (EF 83-29%): Euvolemic to slightly hypovolemic on admission.  Volume controlled with dialysis. -Holding metoprolol as above -Strict I/O's  CAD/Hyperlipidemia: Denies any chest pain. -Continue atorvastatin -Not on aspirin as he is on Coumadin  Leukocytoclastic vasculitis with thrombocytopenia and chronic purpuric rash: Currently stable, no signs of bleeding. -Continue to monitor   DVT prophylaxis: Coumadin Code Status: Full code, confirmed with patient and daughter at bedside Family Communication: Discussed with daughter at bedside Disposition Plan: Pending stabilization of blood pressure, clinical progress Consults called: Hand surgery Admission status: Observation   Zada Finders MD [facility] Pager [phone]  If 7PM-7AM, please contact night-coverage [email or web address]  02/03/2019, 6:59 PM

## 2019-02-03 NOTE — ED Notes (Signed)
Patient transported to CT 

## 2019-02-03 NOTE — ED Triage Notes (Addendum)
Pt fell at home in kitchen , usually walks with a cane  , landed on left wrist and hand , has splint per ems has swelling and pain , did not lose conscious but due to language barrier they do not know if he hit his head , pt is dialysis last yesterday, bp is soft at 92/52  and is on coumadin has 22 left hand given 2 divided doses of fentanyl of 50 each total 100 given 250 fluid, HAS GOOD RADIAL PULSE LEFT, Can wiggle fingers, < sec cap refrill

## 2019-02-03 NOTE — ED Notes (Signed)
UA attempted. Could not produce.

## 2019-02-03 NOTE — Progress Notes (Signed)
Orthopedic Tech Progress Note Patient Details:  Jeziah Hathaway 03/07/1961 [id number]  Ortho Devices Type of Ortho Device: Arm sling, [place] splint Ortho Device/Splint Location: lue Ortho Device/Splint Interventions: Ordered, Application, Adjustment   Post Interventions Patient Tolerated: Well Instructions Provided: Care of device, Adjustment of device   Karolee Stamps 02/03/2019, 5:10 PM

## 2019-02-03 NOTE — Consult Note (Signed)
Reason for Consult:Left wrist fx Referring Physician: W Plunkett  Michael [name] is an 58 y.o. male.  HPI: Michael [name] was walking with his cane as usual when his legs gave out and fell forward. He put out his hand to break his fall and had immediate pain and a deformity. He straightened the wrist out himself and came to the ED for evaluation. X-rays showed a wrist fx and hand surgery was consulted. He is RHD.  Past Medical History:  Diagnosis Date  . Abdominal pain 02/25/2016  . Acute encephalopathy 08/06/2018  . Acute hypoxemic respiratory failure ([place]) 12/29/2015  . Advance care planning   . Anemia - mild exacerbation 10/15/2015  . Anemia of chronic disease   . Aortic regurgitation 02/27/2016  . Asthma    said he does not know  . Atrial fibrillation with RVR ([place]) 02/19/2016  . Calf pain 09/10/2015  . Chronic kidney disease (CKD), stage IV (severe) ([place])   . Chronic systolic CHF (congestive heart failure) (HCC)    a. prior EF normal; b. Echo 10/16: Mild LVH, EF 30-35%, mitral valve bioprosthesis present without evidence of stenosis or regurgitation, severe LAE, mild RVE with mild to moderately reduced RVSF, moderate RAE  . Cirrhosis ([place])   . Congestive heart failure (CHF) ([place]) 02/25/2016  . Coronary artery disease   . Diabetes mellitus type 2 in obese ([place])   . Diabetes mellitus without complication ([place])   . Dilated cardiomyopathy ([place]) 12/24/2015  . Dyspnea   . Elevated troponin 11/24/2017  . ESRD (end stage renal disease) on dialysis ([place])   . Fluid overload 11/24/2017  . GERD (gastroesophageal reflux disease)   . Gout 10/23/2015  . H/O tricuspid valve repair 2012  . History of echocardiogram    Echo at [facility] in [facility] 4/12: mod LVH, EF 60-65%, mod LAE, tissue MVR ok, mod TR, mod pulmo HTN with RVSP 48 mmHg  . HLD (hyperlipidemia) 10/05/2016  . Hyperkalemia 11/24/2017  . Hypertension   . Mitral valve disease   . Obstructive sleep apnea   . Pancreatitis, ruled out 02/25/2016  .  Paroxysmal atrial fibrillation (HCC)    s/p Maze procedure at time of MVR in 2011  . Permanent atrial fibrillation 02/25/2016  . Pleural effusion   . Polypharmacy 11/23/2015  . Pulmonary HTN ([place])   . 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 Septic shock ([place])   . Syncope and collapse 08/10/2017  . Thrombocytopenia ([place])   . Viral gastroenteritis 02/27/2016    Past Surgical History:  Procedure Laterality Date  . AV FISTULA PLACEMENT Left 07/13/2017   Procedure: LEFT ARM ARTERIOVENOUS (AV) FISTULA CREATION;  Surgeon: Waynetta Sandy, MD;  Location: [facility];  Service: Vascular;  Laterality: Left;  . BASCILIC VEIN TRANSPOSITION Left 11/03/2017   Procedure: BASILIC VEIN TRANSPOSITION SECOND STAGE;  Surgeon: Waynetta Sandy, MD;  Location: [facility] [place];  Service: Vascular;  Laterality: Left;  . CARDIAC SURGERY    . CARDIAC VALVE REPLACEMENT  2011  . CARDIOVERSION N/A 07/09/2017   Procedure: CARDIOVERSION;  Surgeon: Larey Dresser, MD;  Location[facility] ENDOSCOPY;  Service: Cardiovascular;  Laterality: N/A;  . COLONOSCOPY N/A 07/16/2017   Procedure: COLONOSCOPY;  Surgeon: Jerene Bears, MD;  Location: [facility];  Service: Gastroenterology;  Laterality: N/A;  . ESOPHAGOGASTRODUODENOSCOPY N/A 07/16/2017   Procedure: ESOPHAGOGASTRODUODENOSCOPY (EGD);  Surgeon: Jerene Bears, MD;  Location: [facility] ENDOSCOPY;  Service: Gastroenterology;  Laterality: N/A;  . INSERTION OF DIALYSIS CATHETER Right 07/13/2017  Procedure: INSERTION OF DIALYSIS CATHETER RIGHT INTERNAL JUGULAR;  Surgeon: Waynetta Sandy, MD;  Location: [facility];  Service: Vascular;  Laterality: Right;  . RIGHT HEART CATH N/A 06/29/2017   Procedure: RIGHT HEART CATH;  Surgeon: Larey Dresser, MD;  Location: [place] CV LAB;  Service: Cardiovascular;  Laterality: N/A;  . RIGHT/LEFT HEART CATH AND CORONARY ANGIOGRAPHY N/A 08/19/2017   Procedure: RIGHT/LEFT HEART CATH AND CORONARY ANGIOGRAPHY;  Surgeon: Leonie Man, MD;  Location: [place] CV LAB;  Service: Cardiovascular;  Laterality: N/A;  . TEE WITHOUT CARDIOVERSION N/A 07/09/2017   Procedure: TRANSESOPHAGEAL ECHOCARDIOGRAM (TEE);  Surgeon: Larey Dresser, MD;  Location: [facility] ENDOSCOPY;  Service: Cardiovascular;  Laterality: N/A;    Family History  Problem Relation Age of Onset  . Heart attack Neg Hx     Social History:  reports that he has quit smoking. His smoking use included cigarettes. He smoked 1.00 pack per day. He has never used smokeless tobacco. He reports that he does not drink alcohol or use drugs.  Allergies:  Allergies  Allergen Reactions  . Triamcinolone Other (See Comments)    Felt like skin was burning    Medications: I have reviewed the patient's current medications.  Results for orders placed or performed during the hospital encounter of 02/03/19 (from the past 48 hour(s))  CBC with Differential     Status: Abnormal   Collection Time: 02/03/19  1:50 PM  Result Value Ref Range   WBC 6.5 4.0 - 10.5 K/uL   RBC 4.33 4.22 - 5.81 MIL/uL   Hemoglobin 11.5 (L) 13.0 - 17.0 g/dL   HCT 36.3 (L) 39.0 - 52.0 %   MCV 83.8 80.0 - 100.0 fL   MCH 26.6 26.0 - 34.0 pg   MCHC 31.7 30.0 - 36.0 g/dL   RDW 16.9 (H) 11.5 - 15.5 %   Platelets 136 (L) 150 - 400 K/uL    Comment: REPEATED TO VERIFY   nRBC 0.0 0.0 - 0.2 %   Neutrophils Relative % 63 %   Neutro Abs 4.1 1.7 - 7.7 K/uL   Lymphocytes Relative 17 %   Lymphs Abs 1.1 0.7 - 4.0 K/uL   Monocytes Relative 14 %   Monocytes Absolute 0.9 0.1 - 1.0 K/uL   Eosinophils Relative 5 %   Eosinophils Absolute 0.3 0.0 - 0.5 K/uL   Basophils Relative 1 %   Basophils Absolute 0.1 0.0 - 0.1 K/uL   Immature Granulocytes 0 %   Abs Immature Granulocytes 0.02 0.00 - 0.07 K/uL    Comment: Performed at [facility], [address] [place], [place] [address]  Protime-INR     Status: Abnormal   Collection Time: 02/03/19  1:50 PM  Result Value Ref Range   Prothrombin Time 27.7 (H)  11.4 - 15.2 seconds   INR 2.6 (H) 0.8 - 1.2    Comment: (NOTE) INR goal varies based on device and disease states. Performed at [facility], [place] [address], [place], [place] [address]   APTT     Status: Abnormal   Collection Time: 02/03/19  1:50 PM  Result Value Ref Range   aPTT 52 (H) 24 - 36 seconds    Comment:        IF BASELINE aPTT IS ELEVATED, SUGGEST PATIENT RISK ASSESSMENT BE USED TO DETERMINE APPROPRIATE ANTICOAGULANT THERAPY. Performed at [facility], [place] [address], [place], [place] [address]   CBG monitoring, ED  Status: None   Collection Time: 02/03/19  2:52 PM  Result Value Ref Range   Glucose-Capillary 84 70 - 99 mg/dL    Dg Chest 1 View  Result Date: 02/03/2019 CLINICAL DATA:  [place] at home EXAM: CHEST  1 VIEW COMPARISON:  09/13/2018 FINDINGS: Post sternotomy changes. Marked cardiomegaly. Small mildly loculated right pleural effusion. Overall decreased compared to prior. Aortic atherosclerosis. No pneumothorax. IMPRESSION: 1. Cardiomegaly with central vascular congestion and small loculated right pleural effusion. 2. Hazy atelectasis or infiltrate at the right base. Electronically Signed   By: Donavan Foil M.D.   On: 02/03/2019 14:46   Dg Pelvis 1-2 Views  Result Date: 02/03/2019 CLINICAL DATA:  [place] at home EXAM: PELVIS - 1-2 VIEW COMPARISON:  CT 07/13/2017 FINDINGS: SI joints are non widened. Pubic symphysis and rami are intact. No fracture or malalignment. Vascular calcifications. IMPRESSION: No acute osseous abnormality. Electronically Signed   By: Donavan Foil M.D.   On: 02/03/2019 14:47   Dg Wrist Complete Left  Result Date: 02/03/2019 CLINICAL DATA:  Fall with deformity a EXAM: LEFT WRIST - COMPLETE 3+ VIEW COMPARISON:  06/01/2017 FINDINGS: Acute comminuted and impacted intra-articular distal radius fracture. No subluxation. Additional acute mildly comminuted fracture involving the distal ulna and ulnar styloid. IMPRESSION: Acute comminuted  and impacted intra-articular distal radius fracture with additional acute mildly comminuted distal ulna fracture Electronically Signed   By: Donavan Foil M.D.   On: 02/03/2019 14:45   Ct Head Wo Contrast  Result Date: 02/03/2019 CLINICAL DATA:  Cervical spine trauma. EXAM: CT HEAD WITHOUT CONTRAST CT CERVICAL SPINE WITHOUT CONTRAST TECHNIQUE: Multidetector CT imaging of the head and cervical spine was performed following the standard protocol without intravenous contrast. Multiplanar CT image reconstructions of the cervical spine were also generated. COMPARISON:  Head CT August 10, 2017 FINDINGS: CT HEAD FINDINGS Brain: No evidence of acute infarction, hemorrhage, hydrocephalus, extra-axial collection or mass lesion/mass effect. There is chronic diffuse atrophy. Vascular: No hyperdense vessel or unexpected calcification. Skull: Normal. Negative for fracture or focal lesion. Sinuses/Orbits: No acute finding. Other: None. CT CERVICAL SPINE FINDINGS Alignment: Normal. Skull base and vertebrae: No acute fracture. No primary bone lesion or focal pathologic process. Soft tissues and spinal canal: No prevertebral fluid or swelling. No visible canal hematoma. Disc levels: Mild degenerative joint changes with osteophytosis are identified in the mid to lower cervical spine. Upper chest: Negative. Other: None. IMPRESSION: No focal acute intracranial abnormality identified. No acute fracture or dislocation of cervical spine. Electronically Signed   By: Abelardo Diesel M.D.   On: 02/03/2019 14:24   Ct Cervical Spine Wo Contrast  Result Date: 02/03/2019 CLINICAL DATA:  Cervical spine trauma. EXAM: CT HEAD WITHOUT CONTRAST CT CERVICAL SPINE WITHOUT CONTRAST TECHNIQUE: Multidetector CT imaging of the head and cervical spine was performed following the standard protocol without intravenous contrast. Multiplanar CT image reconstructions of the cervical spine were also generated. COMPARISON:  Head CT August 10, 2017  FINDINGS: CT HEAD FINDINGS Brain: No evidence of acute infarction, hemorrhage, hydrocephalus, extra-axial collection or mass lesion/mass effect. There is chronic diffuse atrophy. Vascular: No hyperdense vessel or unexpected calcification. Skull: Normal. Negative for fracture or focal lesion. Sinuses/Orbits: No acute finding. Other: None. CT CERVICAL SPINE FINDINGS Alignment: Normal. Skull base and vertebrae: No acute fracture. No primary bone lesion or focal pathologic process. Soft tissues and spinal canal: No prevertebral fluid or swelling. No visible canal hematoma. Disc levels: Mild degenerative joint changes with osteophytosis are identified in the mid to  lower cervical spine. Upper chest: Negative. Other: None. IMPRESSION: No focal acute intracranial abnormality identified. No acute fracture or dislocation of cervical spine. Electronically Signed   By: Abelardo Diesel M.D.   On: 02/03/2019 14:24    Review of Systems  Constitutional: Negative for weight loss.  HENT: Negative for ear discharge, ear pain, hearing loss and tinnitus.   Eyes: Negative for blurred vision, double vision, photophobia and pain.  Respiratory: Negative for cough, sputum production and shortness of breath.   Cardiovascular: Negative for chest pain.  Gastrointestinal: Negative for abdominal pain, nausea and vomiting.  Genitourinary: Negative for dysuria, flank pain, frequency and urgency.  Musculoskeletal: Positive for joint pain (Left wrist). Negative for back pain, falls, myalgias and neck pain.  Neurological: Negative for dizziness, tingling, sensory change, focal weakness, loss of consciousness and headaches.  Endo/Heme/Allergies: Does not bruise/bleed easily.  Psychiatric/Behavioral: Negative for depression, memory loss and substance abuse. The patient is not nervous/anxious.    Blood pressure 91/68, pulse (!) 51, temperature 97.6 F (36.4 C), temperature source Oral, resp. rate 14, SpO2 93 %. Physical Exam   Constitutional: He appears well-developed and well-nourished. No distress.  HENT:  Head: Normocephalic and atraumatic.  Eyes: Conjunctivae are normal. Right eye exhibits no discharge. Left eye exhibits no discharge. No scleral icterus.  Neck: Normal range of motion.  Cardiovascular: Normal rate and regular rhythm.  Respiratory: Effort normal. No respiratory distress.  Musculoskeletal:     Comments: Left shoulder, elbow, wrist, digits- no skin wounds, wrist edematous, TTP, no instability, no blocks to motion  Sens  Ax/R/M/U intact  Mot   Ax/ R/ PIN/ M/ AIN/ U intact  Rad 2+  Neurological: He is alert.  Skin: Skin is warm and dry. He is not diaphoretic.  Psychiatric: He has a normal mood and affect. His behavior is normal.    Assessment/Plan: Left wrist fx -- Will place in splint. He should f/u with Dr. Fredna Dow in office in a week. NWB. Anticipate non-surgical management. Multiple medical problems including Type 2DM, hypertension, hyperlipidemia, mitral valve replacement with porcine valve, tricuspid valve repair, atrial fibrillation (on anticoagulation with Coumadin and rate control with metoprolol), Chronic systolic heartfailure, Cirrhosis, and ESRD    Lisette Abu, PA-C Orthopedic Surgery [phone] 02/03/2019, 3:46 PM

## 2019-02-03 NOTE — Progress Notes (Signed)
Pharmacy Antibiotic Note  Michael [name] is a 58 y.o. male admitted on 02/03/2019 with concerns for pneumonia.  Pharmacy has been consulted for vancomycin and cefepime dosing. Of note patient has ESRD on HD MWF (last HD session 02/02/19). On admission patient afebrile, hypotensive, and WBC wnl 6.5.  Plan: Cefepime 1g IV q24h Vancomycin 1250 mg IV x 1, then vancomycin 750 mg IV qHD Obtain vancomycin trough PRN F/u HD schedule for dose adjustments F/u C&S, de-escalation plans, and LOT    Temp (24hrs), Avg:97.6 F (36.4 C), Min:97.6 F (36.4 C), Max:97.6 F (36.4 C)  Recent Labs  Lab 02/03/19 1350 02/03/19 1416  WBC 6.5  --   CREATININE  --  5.59*    CrCl cannot be calculated (Unknown ideal weight.).    Allergies  Allergen Reactions  . Triamcinolone Other (See Comments)    Felt like skin was burning    Antimicrobials this admission: Vancomycin 3/19 >>  Cefepime 3/19 >>   Dose adjustments this admission: N/A  Microbiology results: 3/19 BCx:   Thank you for allowing pharmacy to be a part of this patient's care.  Leron Croak, PharmD PGY1 Pharmacy Resident Phone: [phone]  Please check AMION for all [facility] phone numbers 02/03/2019 6:25 PM

## 2019-02-03 NOTE — Progress Notes (Signed)
Pharmacy Antibiotic Note  Michael [name] is a 58 y.o. male admitted on 02/03/2019 due to a fall. Pharmacy has been consulted for vancomycin and cefepime dosing for concerns of pneumonia. Of note patient has ESRD on HD MWF (last HD session 02/02/19). On admission patient afebrile, hypotensive, and WBC wnl 6.5.  Plan: Cefepime 1g IV q24h Vancomycin 1250 mg IV x 1, then vancomycin 750 mg IV qHD Obtain vancomycin trough PRN F/u HD schedule for dose adjustments F/u C&S, de-escalation plans, and LOT Weight: 146 lb (66.2 kg)  Temp (24hrs), Avg:97.6 F (36.4 C), Min:97.6 F (36.4 C), Max:97.6 F (36.4 C)  Recent Labs  Lab 02/03/19 1350 02/03/19 1416  WBC 6.5  --   CREATININE  --  5.59*    Estimated Creatinine Clearance: 12.4 mL/min (A) (by C-G formula based on SCr of 5.59 mg/dL (H)).    Allergies  Allergen Reactions  . Triamcinolone Other (See Comments)    Felt like skin was burning    Antimicrobials this admission: Vancomycin 3/19 >>  Cefepime 3/19 >>   Dose adjustments this admission: N/A  Microbiology results: 3/19 BCx:   Thank you for allowing pharmacy to be a part of this patient's care.  Leron Croak, PharmD PGY1 Pharmacy Resident Phone: [phone]  Please check AMION for all [facility] phone numbers 02/03/2019 6:39 PM

## 2019-02-04 DIAGNOSIS — N185 Chronic kidney disease, stage 5: Secondary | ICD-10-CM

## 2019-02-04 DIAGNOSIS — E7800 Pure hypercholesterolemia, unspecified: Secondary | ICD-10-CM

## 2019-02-04 DIAGNOSIS — I5042 Chronic combined systolic (congestive) and diastolic (congestive) heart failure: Secondary | ICD-10-CM | POA: Diagnosis not present

## 2019-02-04 DIAGNOSIS — S62102A Fracture of unspecified carpal bone, left wrist, initial encounter for closed fracture: Secondary | ICD-10-CM | POA: Diagnosis not present

## 2019-02-04 DIAGNOSIS — E861 Hypovolemia: Secondary | ICD-10-CM

## 2019-02-04 DIAGNOSIS — I9589 Other hypotension: Secondary | ICD-10-CM

## 2019-02-04 DIAGNOSIS — I482 Chronic atrial fibrillation, unspecified: Secondary | ICD-10-CM

## 2019-02-04 DIAGNOSIS — W19XXXD Unspecified fall, subsequent encounter: Secondary | ICD-10-CM | POA: Diagnosis not present

## 2019-02-04 DIAGNOSIS — E1122 Type 2 diabetes mellitus with diabetic chronic kidney disease: Secondary | ICD-10-CM

## 2019-02-04 LAB — GLUCOSE, CAPILLARY
Glucose-Capillary: 105 mg/dL — ABNORMAL HIGH (ref 70–99)
Glucose-Capillary: 65 mg/dL — ABNORMAL LOW (ref 70–99)

## 2019-02-04 LAB — HIV ANTIBODY (ROUTINE TESTING W REFLEX): HIV SCREEN 4TH GENERATION: NONREACTIVE

## 2019-02-04 LAB — RENAL FUNCTION PANEL
Albumin: 3.3 g/dL — ABNORMAL LOW (ref 3.5–5.0)
Anion gap: 12 (ref 5–15)
BUN: 25 mg/dL — ABNORMAL HIGH (ref 6–20)
CO2: 24 mmol/L (ref 22–32)
Calcium: 9 mg/dL (ref 8.9–10.3)
Chloride: 96 mmol/L — ABNORMAL LOW (ref 98–111)
Creatinine, Ser: 6.56 mg/dL — ABNORMAL HIGH (ref 0.61–1.24)
GFR calc Af Amer: 10 mL/min — ABNORMAL LOW (ref >60)
GFR calc non Af Amer: 8 mL/min — ABNORMAL LOW (ref >60)
Glucose, Bld: 83 mg/dL (ref 70–99)
POTASSIUM: 3.7 mmol/L (ref 3.5–5.1)
Phosphorus: 2.8 mg/dL (ref 2.5–4.6)
Sodium: 132 mmol/L — ABNORMAL LOW (ref 135–145)

## 2019-02-04 LAB — PROTIME-INR
INR: 2.7 — AB (ref 0.8–1.2)
Prothrombin Time: 28.1 seconds — ABNORMAL HIGH (ref 11.4–15.2)

## 2019-02-04 LAB — CBC
HEMATOCRIT: 32.8 % — AB (ref 39.0–52.0)
HEMOGLOBIN: 10.1 g/dL — AB (ref 13.0–17.0)
MCH: 25.8 pg — ABNORMAL LOW (ref 26.0–34.0)
MCHC: 30.8 g/dL (ref 30.0–36.0)
MCV: 83.7 fL (ref 80.0–100.0)
Platelets: 130 10*3/uL — ABNORMAL LOW (ref 150–400)
RBC: 3.92 MIL/uL — ABNORMAL LOW (ref 4.22–5.81)
RDW: 16.7 % — ABNORMAL HIGH (ref 11.5–15.5)
WBC: 6.3 10*3/uL (ref 4.0–10.5)
nRBC: 0 % (ref 0.0–0.2)

## 2019-02-04 MED ORDER — CHLORHEXIDINE GLUCONATE CLOTH 2 % EX PADS
6.0000 | MEDICATED_PAD | Freq: Every day | CUTANEOUS | Status: DC
  Administered 2019-02-04: 6 via TOPICAL

## 2019-02-04 MED ORDER — MIDODRINE HCL 5 MG PO TABS
5.0000 mg | ORAL_TABLET | Freq: Once | ORAL | Status: AC
  Administered 2019-02-04: 5 mg via ORAL
  Filled 2019-02-04: qty 1

## 2019-02-04 NOTE — Progress Notes (Signed)
PT Cancellation Note  Patient Details Name: Michael [name] MRN: [id number] DOB: 08/01/1961   Cancelled Treatment:    Reason Eval/Treat Not Completed: Patient not medically ready- attempted to evaluate patient, he was too lethargic at time of visit. Patient going to Dialysis per RN, will re-attempt evaluation later today if time allows.      Carlethia Mesquita 02/04/2019, 12:15 PM

## 2019-02-04 NOTE — Discharge Summary (Signed)
Physician Discharge Summary  Michael [name] SHF:[id number] DOB: 1961/03/14 DOA: 02/03/2019  PCP: Charlott Rakes, MD  Admit date: 02/03/2019 Discharge date: 02/04/2019  Admitted From: Home Disposition: Home  Recommendations for Outpatient Follow-up:  1. Follow up with PCP in 1-2 weeks 2. Please obtain BMP/CBC in one week your next doctors visit.    Discharge Condition: Stable CODE STATUS: Full code Diet recommendation: Renal/diabetic  Brief/Interim Summary: 58 year old with history of diabetes mellitus type 2, combined systolic and diastolic CHF, essential hypertension, mitral valve replacement with porcine valve on Coumadin, paroxysmal atrial fibrillation status post maze, coronary artery disease, cirrhosis, leukocytoclastic vasculitis with chronic purpuric rash, thrombocytopenia came to the hospital after sustaining a fall at home.  He was found to have low blood pressure as well.  No obvious evidence of infection.  Chronically his blood pressure runs in 90s for which she is on midodrine.  Due to his fall he also sustained left wrist fracture and was seen by orthopedic who recommended conservative management, splint and follow-up outpatient.  Chest x-ray showed signs of vascular congestion and right-sided pleural effusion.  Patient was seen by nephrology and plan to have 1 session of HD prior to discharge today.   Discharge Diagnoses:  Principal Problem:   Fall Active Problems:   Type II diabetes mellitus with renal manifestations (HCC)   Chronic atrial fibrillation   Congestive heart failure (CHF) (HCC)   HLD (hyperlipidemia)   ESRD (end stage renal disease) on dialysis (HCC)   Left wrist fracture, closed, initial encounter   Hypotension  Mechanical fall leading to left wrist fracture - Likely from intravascular volume depletion and chronic hypotension.  Currently blood pressure is at the baseline.  Orthopedic recommends splint and nonweightbearing with outpatient follow-up in  1 week.  Continue pain control in the meantime.  Patient will follow with Dr. Fredna Dow  Bilateral pleural effusion - Secondary to fluid overload.  No acute respiratory compromise.  Currently saturating greater than 90% on 2 L nasal cannula.  Chronically uses 2 L at home.  Dialysis should help But most of this is likely chronic  Hypotension, chronic -Patient chronically is on midodrine 3 times daily 10 mg.  I have advised the daughter to time this medication prior to his dialysis session.  Chest x-ray suggestive of pulmonary vascular congestion and pleural effusion, I will hold off on giving further IV fluids as patient is on ESRD and CHF.  ESRD on hemodialysis Monday Wednesday Friday - Has left upper extremity AV fistula.  Splint is placed in the left wrist.  Nephro to dialyze the patient, he can resume outpatient HD starting Monday.  Diabetes mellitus type 2 - Diet controlled.  Closely monitor this outpatient.  Chronic combined diastolic and systolic congestive heart, class II -Overall appears to be euvolemic.  He does have some effusion which likely is chronic.  Plans for dialysis today.  Chronic atrial fibrillation status post Maze procedure Status post mitral valve replacement with porcine valve - Continue Coumadin.  Resume home medications. INR 2.7  Coronary artery disease - On Coumadin and statin.  Leuko-classic vasculitis with thrombocytopenia chronic purpuric rash -No obvious signs of bleeding.  Continue to monitor.  Patient on Coumadin during hospitalization He is full code.  Spoke with the patient's daughter over the phone on the day of discharge Discharge today in stable condition.  Consultations:  Nephro for HD  Subjective: Feels ok, denies any cmplaints. Wishes to go home after HD. Spoke with the daughter over the phone as well.  Discharge Exam: Vitals:   02/04/19 0622 02/04/19 0721  BP:    Pulse:    Resp:    Temp: 97.7 F (36.5 C) 99.3 F (37.4 C)  SpO2:      Vitals:   02/04/19 0131 02/04/19 0447 02/04/19 0622 02/04/19 0721  BP: (!) 85/67 90/69    Pulse: (!) 50 (!) 54    Resp: 14 15    Temp:   97.7 F (36.5 C) 99.3 F (37.4 C)  TempSrc:    Oral  SpO2: 97% 99%    Weight:   67.7 kg   Height:        General: Pt is alert, awake, not in acute distress; 2L [place] Cardiovascular: RRR, S1/S2 +, no rubs, no gallops Respiratory: CTA bilaterally, no wheezing, no rhonchi Abdominal: Soft, NT, ND, bowel sounds + Extremities: no edema, no cyanosis LUE AV fistula. Splint in place.   Discharge Instructions   Allergies as of 02/04/2019      Reactions   Triamcinolone Other (See Comments)   Felt like skin was burning      Medication List    STOP taking these medications   ondansetron 4 MG disintegrating tablet Commonly known as:  ZOFRAN-ODT   traMADol 50 MG tablet Commonly known as:  ULTRAM     TAKE these medications   amiodarone 200 MG tablet Commonly known as:  PACERONE TAKE 1 TABLET BY MOUTH TWICE A DAY   atorvastatin 20 MG tablet Commonly known as:  LIPITOR TAKE 1 TABLET (20 MG TOTAL) BY MOUTH DAILY. PLEASE KEEP UPCOMING APPT. THANK YOU   baclofen 10 MG tablet Commonly known as:  LIORESAL Take 0.5 tablets (5 mg total) by mouth 3 (three) times daily as needed for muscle spasms.   calcium acetate 667 MG capsule Commonly known as:  PHOSLO Take 1 capsule (667 mg total) by mouth 3 (three) times daily with meals. Please keep upcoming appt. Thank You   cetirizine 10 MG tablet Commonly known as:  ZYRTEC Take 1 tablet (10 mg total) by mouth daily.   colchicine 0.6 MG tablet Take 0.5 tablets (0.3 mg total) by mouth 2 (two) times a week.   Dialyvite/Zinc Tabs Take 1 tablet by mouth daily.   diazepam 5 MG tablet Commonly known as:  VALIUM Take 5 mg by mouth at bedtime as needed (sleep).   diphenhydramine-acetaminophen 25-500 MG Tabs tablet Commonly known as:  TYLENOL PM Take 1 tablet by mouth at bedtime as needed (pain).    DULoxetine 60 MG capsule Commonly known as:  Cymbalta Take 1 capsule (60 mg total) by mouth daily. For back pain   fluticasone 50 MCG/ACT nasal spray Commonly known as:  FLONASE Place 2 sprays into both nostrils daily.   folic acid 1 MG tablet Commonly known as:  FOLVITE TAKE 1 TABLET BY MOUTH EVERY DAY   gabapentin 100 MG capsule Commonly known as:  NEURONTIN Take 200 mg by mouth at bedtime. FOR LEG PAIN   lidocaine 2 % solution Commonly known as:  XYLOCAINE Swish and spit 15 ml Q 6 hrs PRN   metoprolol tartrate 25 MG tablet Commonly known as:  LOPRESSOR Take 1 tablet (25 mg total) by mouth 2 (two) times daily. What changed:  how much to take   midodrine 10 MG tablet Commonly known as:  PROAMATINE Take 5 mg by mouth 3 (three) times daily with meals.   multivitamin Tabs tablet Take 1 tablet by mouth at bedtime.   OXYGEN Inhale 2 L into  the lungs daily as needed (shortness of breath).   traZODone 100 MG tablet Commonly known as:  DESYREL Take 1 tablet (100 mg total) by mouth at bedtime. for sleep   warfarin 2.5 MG tablet Commonly known as:  COUMADIN Take as directed. If you are unsure how to take this medication, talk to your nurse or doctor. Original instructions:  TAKE AS DIRECTED BY COUMADIN CLINIC What changed:  See the new instructions.      Follow-up Information    Leanora Cover, MD. Schedule an appointment as soon as possible for a visit in 1 week(s).   Specialty:  Orthopedic Surgery Contact information: [place] [address] [phone]        Charlott Rakes, MD. Schedule an appointment as soon as possible for a visit in 1 week(s).   Specialty:  Family Medicine Contact information: [place] [address] [phone]        Fay Records, MD .   Specialty:  Cardiology Contact information: [address] [place] [address] [phone]          Allergies  Allergen Reactions   . Triamcinolone Other (See Comments)    Felt like skin was burning    You were cared for by a hospitalist during your hospital stay. If you have any questions about your discharge medications or the care you received while you were in the hospital after you are discharged, you can call the unit and asked to speak with the hospitalist on call if the hospitalist that took care of you is not available. Once you are discharged, your primary care physician will handle any further medical issues. Please note that no refills for any discharge medications will be authorized once you are discharged, as it is imperative that you return to your primary care physician (or establish a relationship with a primary care physician if you do not have one) for your aftercare needs so that they can reassess your need for medications and monitor your lab values.   Procedures/Studies: Dg Chest 1 View  Result Date: 02/03/2019 CLINICAL DATA:  [place] at home EXAM: CHEST  1 VIEW COMPARISON:  09/13/2018 FINDINGS: Post sternotomy changes. Marked cardiomegaly. Small mildly loculated right pleural effusion. Overall decreased compared to prior. Aortic atherosclerosis. No pneumothorax. IMPRESSION: 1. Cardiomegaly with central vascular congestion and small loculated right pleural effusion. 2. Hazy atelectasis or infiltrate at the right base. Electronically Signed   By: Donavan Foil M.D.   On: 02/03/2019 14:46   Dg Pelvis 1-2 Views  Result Date: 02/03/2019 CLINICAL DATA:  [place] at home EXAM: PELVIS - 1-2 VIEW COMPARISON:  CT 07/13/2017 FINDINGS: SI joints are non widened. Pubic symphysis and rami are intact. No fracture or malalignment. Vascular calcifications. IMPRESSION: No acute osseous abnormality. Electronically Signed   By: Donavan Foil M.D.   On: 02/03/2019 14:47   Dg Wrist Complete Left  Result Date: 02/03/2019 CLINICAL DATA:  Fall with deformity a EXAM: LEFT WRIST - COMPLETE 3+ VIEW COMPARISON:  06/01/2017 FINDINGS: Acute  comminuted and impacted intra-articular distal radius fracture. No subluxation. Additional acute mildly comminuted fracture involving the distal ulna and ulnar styloid. IMPRESSION: Acute comminuted and impacted intra-articular distal radius fracture with additional acute mildly comminuted distal ulna fracture Electronically Signed   By: Donavan Foil M.D.   On: 02/03/2019 14:45   Ct Head Wo Contrast  Result Date: 02/03/2019 CLINICAL DATA:  Cervical spine trauma. EXAM: CT HEAD WITHOUT CONTRAST CT CERVICAL  SPINE WITHOUT CONTRAST TECHNIQUE: Multidetector CT imaging of the head and cervical spine was performed following the standard protocol without intravenous contrast. Multiplanar CT image reconstructions of the cervical spine were also generated. COMPARISON:  Head CT August 10, 2017 FINDINGS: CT HEAD FINDINGS Brain: No evidence of acute infarction, hemorrhage, hydrocephalus, extra-axial collection or mass lesion/mass effect. There is chronic diffuse atrophy. Vascular: No hyperdense vessel or unexpected calcification. Skull: Normal. Negative for fracture or focal lesion. Sinuses/Orbits: No acute finding. Other: None. CT CERVICAL SPINE FINDINGS Alignment: Normal. Skull base and vertebrae: No acute fracture. No primary bone lesion or focal pathologic process. Soft tissues and spinal canal: No prevertebral fluid or swelling. No visible canal hematoma. Disc levels: Mild degenerative joint changes with osteophytosis are identified in the mid to lower cervical spine. Upper chest: Negative. Other: None. IMPRESSION: No focal acute intracranial abnormality identified. No acute fracture or dislocation of cervical spine. Electronically Signed   By: Abelardo Diesel M.D.   On: 02/03/2019 14:24   Ct Cervical Spine Wo Contrast  Result Date: 02/03/2019 CLINICAL DATA:  Cervical spine trauma. EXAM: CT HEAD WITHOUT CONTRAST CT CERVICAL SPINE WITHOUT CONTRAST TECHNIQUE: Multidetector CT imaging of the head and cervical spine was  performed following the standard protocol without intravenous contrast. Multiplanar CT image reconstructions of the cervical spine were also generated. COMPARISON:  Head CT August 10, 2017 FINDINGS: CT HEAD FINDINGS Brain: No evidence of acute infarction, hemorrhage, hydrocephalus, extra-axial collection or mass lesion/mass effect. There is chronic diffuse atrophy. Vascular: No hyperdense vessel or unexpected calcification. Skull: Normal. Negative for fracture or focal lesion. Sinuses/Orbits: No acute finding. Other: None. CT CERVICAL SPINE FINDINGS Alignment: Normal. Skull base and vertebrae: No acute fracture. No primary bone lesion or focal pathologic process. Soft tissues and spinal canal: No prevertebral fluid or swelling. No visible canal hematoma. Disc levels: Mild degenerative joint changes with osteophytosis are identified in the mid to lower cervical spine. Upper chest: Negative. Other: None. IMPRESSION: No focal acute intracranial abnormality identified. No acute fracture or dislocation of cervical spine. Electronically Signed   By: Abelardo Diesel M.D.   On: 02/03/2019 14:24      The results of significant diagnostics from this hospitalization (including imaging, microbiology, ancillary and laboratory) are listed below for reference.     Microbiology: Recent Results (from the past 240 hour(s))  MRSA PCR Screening     Status: None   Collection Time: 02/03/19  8:37 PM  Result Value Ref Range Status   MRSA by PCR NEGATIVE NEGATIVE Final    Comment:        The GeneXpert MRSA Assay (FDA approved for NASAL specimens only), is one component of a comprehensive MRSA colonization surveillance program. It is not intended to diagnose MRSA infection nor to guide or monitor treatment for MRSA infections. Performed at [facility], [place] [address] [place], [place] [address]      Labs: BNP (last 3 results) No results for input(s): BNP in the last 8760 hours. Basic Metabolic  Panel: Recent Labs  Lab 02/03/19 1416 02/04/19 0349  NA 132* 132*  K 3.8 3.7  CL 96* 96*  CO2 27 24  GLUCOSE 93 83  BUN 19 25*  CREATININE 5.59* 6.56*  CALCIUM 9.1 9.0  PHOS  --  2.8   Liver Function Tests: Recent Labs  Lab 02/03/19 1416 02/04/19 0349  AST 40  --   ALT 26  --   ALKPHOS 162*  --   BILITOT 1.4*  --  PROT 8.5*  --   ALBUMIN 3.5 3.3*   Recent Labs  Lab 02/03/19 1350  LIPASE 39   No results for input(s): AMMONIA in the last 168 hours. CBC: Recent Labs  Lab 02/03/19 1350 02/04/19 0349  WBC 6.5 6.3  NEUTROABS 4.1  --   HGB 11.5* 10.1*  HCT 36.3* 32.8*  MCV 83.8 83.7  PLT 136* 130*   Cardiac Enzymes: No results for input(s): CKTOTAL, CKMB, CKMBINDEX, TROPONINI in the last 168 hours. BNP: Invalid input(s): POCBNP CBG: Recent Labs  Lab 02/03/19 1452  GLUCAP 84   D-Dimer No results for input(s): DDIMER in the last 72 hours. Hgb A1c No results for input(s): HGBA1C in the last 72 hours. Lipid Profile No results for input(s): CHOL, HDL, LDLCALC, TRIG, CHOLHDL, LDLDIRECT in the last 72 hours. Thyroid function studies No results for input(s): TSH, T4TOTAL, T3FREE, THYROIDAB in the last 72 hours.  Invalid input(s): FREET3 Anemia work up No results for input(s): VITAMINB12, FOLATE, FERRITIN, TIBC, IRON, RETICCTPCT in the last 72 hours. Urinalysis    Component Value Date/Time   COLORURINE AMBER (A) 08/11/2018 1943   APPEARANCEUR CLOUDY (A) 08/11/2018 1943   LABSPEC 1.021 08/11/2018 1943   PHURINE 5.0 08/11/2018 1943   GLUCOSEU NEGATIVE 08/11/2018 1943   HGBUR LARGE (A) 08/11/2018 [place] NEGATIVE 08/11/2018 1943   KETONESUR 5 (A) 08/11/2018 1943   PROTEINUR 100 (A) 08/11/2018 1943   NITRITE NEGATIVE 08/11/2018 1943   LEUKOCYTESUR MODERATE (A) 08/11/2018 1943   Sepsis Labs Invalid input(s): PROCALCITONIN,  WBC,  LACTICIDVEN Microbiology Recent Results (from the past 240 hour(s))  MRSA PCR Screening     Status: None    Collection Time: 02/03/19  8:37 PM  Result Value Ref Range Status   MRSA by PCR NEGATIVE NEGATIVE Final    Comment:        The GeneXpert MRSA Assay (FDA approved for NASAL specimens only), is one component of a comprehensive MRSA colonization surveillance program. It is not intended to diagnose MRSA infection nor to guide or monitor treatment for MRSA infections. Performed at [facility], [place] [address] [place], [place] [address]      Time coordinating discharge:  I have spent 35 minutes face to face with the patient and on the ward discussing the patients care, assessment, plan and disposition with other care givers. >50% of the time was devoted counseling the patient about the risks and benefits of treatment/Discharge disposition and coordinating care.   SIGNED:   Damita Lack, MD  [facility] 02/04/2019, 10:31 AM   If 7PM-7AM, please contact night-coverage [email or web address]

## 2019-02-04 NOTE — Progress Notes (Signed)
Patient received to the unit via bed. Patient is alert and oriented x4. Iv in place. Patient given medicine for pain and schedule as well. Skin assessment done with another nurse. Patient is on bed side monitor. Patient and family oriented to room and given instructions about call bell and phone. Bed in low position and call bell in reach.

## 2019-02-04 NOTE — Progress Notes (Addendum)
ANTICOAGULATION CONSULT NOTE - Initial Consult  Pharmacy Consult for warfarin Indication: mechanical valve  Allergies  Allergen Reactions  . Triamcinolone Other (See Comments)    Felt like skin was burning    Patient Measurements: Height: 5\' 2"  (157.5 cm) Weight: 149 lb 4 oz (67.7 kg) IBW/kg (Calculated) : 54.6  Vital Signs: Temp: 99.3 F (37.4 C) (03/20 0721) Temp Source: Oral (03/20 0721) BP: 90/69 (03/20 0447) Pulse Rate: 54 (03/20 0447)  Labs: Recent Labs    02/03/19 1350 02/03/19 1416 02/04/19 0349  HGB 11.5*  --  10.1*  HCT 36.3*  --  32.8*  PLT 136*  --  130*  APTT 52*  --   --   LABPROT 27.7*  --  28.1*  INR 2.6*  --  2.7*  CREATININE  --  5.59* 6.56*    Estimated Creatinine Clearance: 10.3 mL/min (A) (by C-G formula based on SCr of 6.56 mg/dL (H)).   Medical History: Past Medical History:  Diagnosis Date  . Abdominal pain 02/25/2016  . Acute encephalopathy 08/06/2018  . Acute hypoxemic respiratory failure ([place]) 12/29/2015  . Advance care planning   . Anemia - mild exacerbation 10/15/2015  . Anemia of chronic disease   . Aortic regurgitation 02/27/2016  . Asthma    said he does not know  . Atrial fibrillation with RVR ([place]) 02/19/2016  . Calf pain 09/10/2015  . Chronic kidney disease (CKD), stage IV (severe) ([place])   . Chronic systolic CHF (congestive heart failure) (HCC)    a. prior EF normal; b. Echo 10/16: Mild LVH, EF 30-35%, mitral valve bioprosthesis present without evidence of stenosis or regurgitation, severe LAE, mild RVE with mild to moderately reduced RVSF, moderate RAE  . Cirrhosis ([place])   . Congestive heart failure (CHF) ([place]) 02/25/2016  . Coronary artery disease   . Diabetes mellitus type 2 in obese ([place])   . Diabetes mellitus without complication ([place])   . Dilated cardiomyopathy ([place]) 12/24/2015  . Dyspnea   . Elevated troponin 11/24/2017  . ESRD (end stage renal disease) on dialysis ([place])   . Fluid overload 11/24/2017  . GERD  (gastroesophageal reflux disease)   . Gout 10/23/2015  . H/O tricuspid valve repair 2012  . History of echocardiogram    Echo at [facility] 4/12: mod LVH, EF 60-65%, mod LAE, tissue MVR ok, mod TR, mod pulmo HTN with RVSP 48 mmHg  . HLD (hyperlipidemia) 10/05/2016  . Hyperkalemia 11/24/2017  . Hypertension   . Mitral valve disease   . Obstructive sleep apnea   . Pancreatitis, ruled out 02/25/2016  . Paroxysmal atrial fibrillation (HCC)    s/p Maze procedure at time of MVR in 2011  . Permanent atrial fibrillation 02/25/2016  . Pleural effusion   . Polypharmacy 11/23/2015  . Pulmonary HTN ([place])   . 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 Septic shock ([place])   . Syncope and collapse 08/10/2017  . Thrombocytopenia ([place])   . Viral gastroenteritis 02/27/2016    Assessment: 50 yoM admitted 3/19 with history of atrial fibrillation w/ MVR on warfarin PTA. Last dose PTA on 3/19. INR therapeutic at 2.7. CBC stable with Hgb 10.1, PLT 130.  PTA dose: warfarin 2.5 mg PO daily  Goal of Therapy:  INR 2-3 Monitor platelets by anticoagulation protocol: Yes   Plan:  Give warfarin 2.5 mg PO x 1 tonight Monitor daily INR Monitor for s/sx of bleeding  Thank you for allowing pharmacy to be a part  of this patient's care.  Lavena Bullion, PharmD Candidate  02/04/2019,10:09 AM   I discussed / reviewed the pharmacy note by Lavena Bullion, PharmD Candidate and I agree with the resident's findings and plans as documented.  Nicole Cella, RPh Clinical Pharmacist Please check AMION for all [place] phone numbers After 10:00 PM, call [place] [phone] 02/04/2019 2:11 PM

## 2019-02-04 NOTE — Evaluation (Signed)
Occupational Therapy Evaluation Patient Details Name: Michael [name] MRN: [id number] DOB: 05/02/1961 Today's Date: 02/04/2019    History of Present Illness Michael [name] is a 58 y.o. male with PHMx: for PAF, MVR w/ porcine valve, ESRD on MWF HD, chronic combined systolic and diastolic CHF (EF 81-85%), pHTN, T2DM, chronic hypotension, CAD, Cirrhosis, leukocytoclastic vasculitis with chronic purpuric rash, and thrombocytopenia who presents to the ED after a fall. Found to have left wrist fracture s/p splinted.   Clinical Impression   This 58 yo male admitted with above presents to acute OT with decreased alertness, decreased balance, decreased use of LUE, and decreased safety awareness all affecting his safety and independence with basic ADLs and requiring A for all mobility and ADLs. He normally can do all ADLs independently. He will benefit from acute OT with follow up OT at SNF (however with observation status and Medicare he cannot go to this venue), thus recommend HHOT, HHPT, and HHAid as well as a 3n1 and 24 hour care.   Pt with jerky movements at rest.     Follow Up Recommendations  SNF;Supervision/Assistance - 24 hour;Other (comment)(but with pt Medicare and observation only option is HHOT, HHAid, 24 hour S)    Equipment Recommendations  3 in 1 bedside commode       Precautions / Restrictions Precautions Precautions: Fall Required Braces or Orthoses: Sling Restrictions Weight Bearing Restrictions: No      Mobility Bed Mobility Overal bed mobility: Needs Assistance Bed Mobility: Supine to Sit     Supine to sit: Mod assist     General bed mobility comments: for trunk  Transfers Overall transfer level: Needs assistance Equipment used: 1 person hand held assist Transfers: Sit to/from Stand;Stand Pivot Transfers Sit to Stand: Min assist Stand pivot transfers: Mod assist            Balance Overall balance assessment: Needs assistance Sitting-balance  support: Single extremity supported;Feet supported Sitting balance-Leahy Scale: Poor Sitting balance - Comments: tendency for posterior lean   Standing balance support: Single extremity supported Standing balance-Leahy Scale: Poor                             ADL either performed or assessed with clinical judgement   ADL                                         General ADL Comments: total A                  Pertinent Vitals/Pain Pain Assessment: No/denies pain     Hand Dominance Right   Extremity/Trunk Assessment Upper Extremity Assessment Upper Extremity Assessment: Generalized weakness;LUE deficits/detail LUE Deficits / Details: sugar tong splint LUE Coordination: decreased gross motor           Communication Communication Communication: Prefers language other than English(Laotian)   Cognition Arousal/Alertness: Lethargic(did have pain meds aorund 5:30 AM) Behavior During Therapy: Flat affect Overall Cognitive Status: No family/caregiver present to determine baseline cognitive functioning                                                Home Living Family/patient expects to be discharged to:: Skilled nursing facility   Available Help  at Discharge: Family Type of Home: House Home Access: Stairs to enter [employer] of Steps: 3 Entrance Stairs-Rails: Can reach both Home Layout: One level     Bathroom Shower/Tub: Walk-in Hydrologist: [place] - single point;Shower seat;Other (comment);Grab bars - tub/shower;Hand held shower head   Additional Comments: 2 liters      Prior Functioning/Environment Level of Independence: Independent with assistive device(s)        Comments: lives with roommate and her kids; only uses [facility] after dialysis        OT Problem List: Decreased strength;Decreased range of motion;Decreased activity tolerance;Impaired  balance (sitting and/or standing);Impaired UE functional use;Decreased knowledge of use of DME or AE      OT Treatment/Interventions: Self-care/ADL training;Balance training;Therapeutic exercise;DME and/or AE instruction;Patient/family education    OT Goals(Current goals can be found in the care plan section) Acute Rehab OT Goals OT Goal Formulation: With patient Time For Goal Achievement: 02/11/19 Potential to Achieve Goals: Good  OT Frequency: Min 3X/week   Barriers to D/C: Decreased caregiver support             AM-PAC OT "6 Clicks" Daily Activity     Outcome Measure Help from another person eating meals?: Total Help from another person taking care of personal grooming?: Total Help from another person toileting, which includes using toliet, bedpan, or urinal?: Total Help from another person bathing (including washing, rinsing, drying)?: Total Help from another person to put on and taking off regular upper body clothing?: Total Help from another person to put on and taking off regular lower body clothing?: Total 6 Click Score: 6   End of Session Equipment Utilized During Treatment: Gait belt(sling LUE) Nurse Communication: Mobility status  Activity Tolerance: Patient limited by lethargy Patient left: in bed;with call bell/phone within reach;with bed alarm set  OT Visit Diagnosis: Unsteadiness on feet (R26.81);Other abnormalities of gait and mobility (R26.89);History of falling (Z91.81);Muscle weakness (generalized) (M62.81)                Time: 4854-6270 OT Time Calculation (min): 27 min Charges:  OT General Charges $OT Visit: 1 Visit OT Evaluation $OT Eval Moderate Complexity: 1 Mod OT Treatments $Self Care/Home Management : 8-22 mins  [place], OTR/L Acute [employer] Pager [phone] Office [phone]     Almon Register 02/04/2019, 12:10 PM

## 2019-02-04 NOTE — Progress Notes (Addendum)
  [facility] Progress Note   Michael [name] is a 58 Y/O Asian male on hemodialysis MWFS at [facility]. PMH significant for ESRD 2/2 cardiorenal syndrome PMH chronic systolic HF, CAD, DMT2, PAF, mitral valve/tricuspid valve repair S/P maze procedure, on coumadin, leukocytoclastic vasculitis with chronic purpuric rash, and thrombocytopenia. Patient presented to ED after fall at home. CT of head negative, Xray L wrist showed comminuted and impacted intra-articular distal radius fracture and comminuted distal ulnar fracture. He has been admitted as observation patient, has chair at [facility] for HD but he will not be discharged in time to go to HD at OP center. Will have HD here today and discharge home tomorrow.    HD orders: [facility] MWFS 4 hrs 180NRe 300/800 65 kg 3.0 K/2.25 Ca Linear Sodium L AVF -No heparin -Hectorol 1 mcg IV TIW -Mircera 50 mcg IV q 2 weeks (Last dose 01/24/19 Last HGB 11.1 02/02/19   Rita H. Brown NP-C 02/04/2019, 11:38 AM  [facility] [phone]  Pt seen, examined and agree w A/P as above.  [facility] 02/04/2019, 12:30 PM

## 2019-02-04 NOTE — Procedures (Signed)
   I was present at this dialysis session, have reviewed the session itself and made  appropriate changes Kelly Splinter MD [place] pager [phone]   02/04/2019, 12:30 PM

## 2019-02-05 DIAGNOSIS — E861 Hypovolemia: Secondary | ICD-10-CM | POA: Diagnosis not present

## 2019-02-05 DIAGNOSIS — N185 Chronic kidney disease, stage 5: Secondary | ICD-10-CM | POA: Diagnosis not present

## 2019-02-05 DIAGNOSIS — I9589 Other hypotension: Secondary | ICD-10-CM | POA: Diagnosis not present

## 2019-02-05 DIAGNOSIS — S62102A Fracture of unspecified carpal bone, left wrist, initial encounter for closed fracture: Secondary | ICD-10-CM | POA: Diagnosis not present

## 2019-02-05 DIAGNOSIS — N186 End stage renal disease: Secondary | ICD-10-CM

## 2019-02-05 DIAGNOSIS — W19XXXD Unspecified fall, subsequent encounter: Secondary | ICD-10-CM | POA: Diagnosis not present

## 2019-02-05 DIAGNOSIS — I482 Chronic atrial fibrillation, unspecified: Secondary | ICD-10-CM | POA: Diagnosis not present

## 2019-02-05 DIAGNOSIS — Z992 Dependence on renal dialysis: Secondary | ICD-10-CM

## 2019-02-05 DIAGNOSIS — I5042 Chronic combined systolic (congestive) and diastolic (congestive) heart failure: Secondary | ICD-10-CM | POA: Diagnosis not present

## 2019-02-05 DIAGNOSIS — E1122 Type 2 diabetes mellitus with diabetic chronic kidney disease: Secondary | ICD-10-CM | POA: Diagnosis not present

## 2019-02-05 LAB — PROTIME-INR
INR: 2.2 — ABNORMAL HIGH (ref 0.8–1.2)
Prothrombin Time: 24.5 seconds — ABNORMAL HIGH (ref 11.4–15.2)

## 2019-02-05 LAB — CBC
HCT: 29.3 % — ABNORMAL LOW (ref 39.0–52.0)
Hemoglobin: 9.2 g/dL — ABNORMAL LOW (ref 13.0–17.0)
MCH: 26.1 pg (ref 26.0–34.0)
MCHC: 31.4 g/dL (ref 30.0–36.0)
MCV: 83 fL (ref 80.0–100.0)
Platelets: 138 10*3/uL — ABNORMAL LOW (ref 150–400)
RBC: 3.53 MIL/uL — ABNORMAL LOW (ref 4.22–5.81)
RDW: 16.3 % — ABNORMAL HIGH (ref 11.5–15.5)
WBC: 6.5 10*3/uL (ref 4.0–10.5)
nRBC: 0 % (ref 0.0–0.2)

## 2019-02-05 LAB — RENAL FUNCTION PANEL
Albumin: 3.2 g/dL — ABNORMAL LOW (ref 3.5–5.0)
Anion gap: 10 (ref 5–15)
BUN: 19 mg/dL (ref 6–20)
CO2: 26 mmol/L (ref 22–32)
Calcium: 8.9 mg/dL (ref 8.9–10.3)
Chloride: 96 mmol/L — ABNORMAL LOW (ref 98–111)
Creatinine, Ser: 5.48 mg/dL — ABNORMAL HIGH (ref 0.61–1.24)
GFR calc Af Amer: 12 mL/min — ABNORMAL LOW (ref >60)
GFR calc non Af Amer: 10 mL/min — ABNORMAL LOW (ref >60)
Glucose, Bld: 130 mg/dL — ABNORMAL HIGH (ref 70–99)
Phosphorus: 2 mg/dL — ABNORMAL LOW (ref 2.5–4.6)
Potassium: 4.2 mmol/L (ref 3.5–5.1)
Sodium: 132 mmol/L — ABNORMAL LOW (ref 135–145)

## 2019-02-05 MED ORDER — DARBEPOETIN ALFA 40 MCG/0.4ML IJ SOSY: 40.0000 ug | PREFILLED_SYRINGE | INTRAMUSCULAR | Status: DC

## 2019-02-05 MED ORDER — LIDOCAINE-PRILOCAINE 2.5-2.5 % EX CREA: 1.0000 "application " | TOPICAL_CREAM | CUTANEOUS | Status: DC | PRN

## 2019-02-05 MED ORDER — DARBEPOETIN ALFA 25 MCG/0.42ML IJ SOSY
PREFILLED_SYRINGE | INTRAMUSCULAR | Status: AC
  Administered 2019-02-05: 25 ug via INTRAVENOUS
  Filled 2019-02-05: qty 0.42

## 2019-02-05 MED ORDER — LIDOCAINE HCL (PF) 1 % IJ SOLN: 5.0000 mL | INTRAMUSCULAR | Status: DC | PRN

## 2019-02-05 MED ORDER — SODIUM CHLORIDE 0.9 % IV SOLN: 100.0000 mL | INTRAVENOUS | Status: DC | PRN

## 2019-02-05 MED ORDER — DARBEPOETIN ALFA 25 MCG/0.42ML IJ SOSY
25.0000 ug | PREFILLED_SYRINGE | INTRAMUSCULAR | Status: DC
  Administered 2019-02-05 (×2): 25 ug via INTRAVENOUS

## 2019-02-05 MED ORDER — MIDODRINE HCL 5 MG PO TABS
ORAL_TABLET | ORAL | Status: AC
  Filled 2019-02-05: qty 2

## 2019-02-05 MED ORDER — PENTAFLUOROPROP-TETRAFLUOROETH EX AERO: 1.0000 "application " | INHALATION_SPRAY | CUTANEOUS | Status: DC | PRN

## 2019-02-05 MED ORDER — WARFARIN SODIUM 2.5 MG PO TABS
2.5000 mg | ORAL_TABLET | Freq: Once | ORAL | Status: AC
  Administered 2019-02-05: 2.5 mg via ORAL
  Filled 2019-02-05: qty 1

## 2019-02-05 MED ORDER — MIDODRINE HCL 5 MG PO TABS
10.0000 mg | ORAL_TABLET | ORAL | Status: DC
  Administered 2019-02-05: 10 mg via ORAL

## 2019-02-05 NOTE — Evaluation (Signed)
PT Cancellation Note  Patient Details Name: Michael [name] MRN: [id number] DOB: [birth year]   Cancelled Treatment:    Reason Eval/Treat Not Completed: Patient at procedure or test/unavailable; patient in HD currently.  Will attempt another day.   Reginia Naas 02/05/2019, 3:32 PM Magda Kiel, [place] [phone] 02/05/2019

## 2019-02-05 NOTE — Care Management (Signed)
Spoke w patient's daughter on the phone. She would like to use [facility] for home care. Referral accepted by [facility]. Declined need for additional DME. Daughter aware patient will DC later today.

## 2019-02-05 NOTE — Progress Notes (Signed)
Reviewed discharge papers with pt and daughter, Ilona Sorrel. IV removed by day shift RN. Pt in no apparent distress. Wheeled pt down in wheelchair along with daughter and pt belonging bag.

## 2019-02-05 NOTE — Progress Notes (Signed)
ANTICOAGULATION CONSULT NOTE - Initial Consult  Pharmacy Consult for warfarin Indication: porcine mitral valve and atrial fibrillation  Allergies  Allergen Reactions  . Triamcinolone Other (See Comments)    Felt like skin was burning    Patient Measurements: Height: 5\' 2"  (157.5 cm) Weight: 147 lb 14.9 oz (67.1 kg) IBW/kg (Calculated) : 54.6  Vital Signs: Temp: 98.2 F (36.8 C) (03/21 0533) Temp Source: Oral (03/21 0533) BP: 107/85 (03/21 0533) Pulse Rate: 59 (03/21 0007)  Labs: Recent Labs    02/03/19 1350 02/03/19 1416 02/04/19 0349 02/05/19 0546  HGB 11.5*  --  10.1*  --   HCT 36.3*  --  32.8*  --   PLT 136*  --  130*  --   APTT 52*  --   --   --   LABPROT 27.7*  --  28.1* 24.5*  INR 2.6*  --  2.7* 2.2*  CREATININE  --  5.59* 6.56*  --     Estimated Creatinine Clearance: 10.2 mL/min (A) (by C-G formula based on SCr of 6.56 mg/dL (H)).   Medical History: Past Medical History:  Diagnosis Date  . Abdominal pain 02/25/2016  . Acute encephalopathy 08/06/2018  . Acute hypoxemic respiratory failure ([place]) 12/29/2015  . Advance care planning   . Anemia - mild exacerbation 10/15/2015  . Anemia of chronic disease   . Aortic regurgitation 02/27/2016  . Asthma    said he does not know  . Atrial fibrillation with RVR ([place]) 02/19/2016  . Calf pain 09/10/2015  . Chronic kidney disease (CKD), stage IV (severe) ([place])   . Chronic systolic CHF (congestive heart failure) (HCC)    a. prior EF normal; b. Echo 10/16: Mild LVH, EF 30-35%, mitral valve bioprosthesis present without evidence of stenosis or regurgitation, severe LAE, mild RVE with mild to moderately reduced RVSF, moderate RAE  . Cirrhosis ([place])   . Congestive heart failure (CHF) ([place]) 02/25/2016  . Coronary artery disease   . Diabetes mellitus type 2 in obese ([place])   . Diabetes mellitus without complication ([place])   . Dilated cardiomyopathy ([place]) 12/24/2015  . Dyspnea   . Elevated troponin 11/24/2017  . ESRD (end stage  renal disease) on dialysis ([place])   . Fluid overload 11/24/2017  . GERD (gastroesophageal reflux disease)   . Gout 10/23/2015  . H/O tricuspid valve repair 2012  . History of echocardiogram    Echo at [facility] 4/12: mod LVH, EF 60-65%, mod LAE, tissue MVR ok, mod TR, mod pulmo HTN with RVSP 48 mmHg  . HLD (hyperlipidemia) 10/05/2016  . Hyperkalemia 11/24/2017  . Hypertension   . Mitral valve disease   . Obstructive sleep apnea   . Pancreatitis, ruled out 02/25/2016  . Paroxysmal atrial fibrillation (HCC)    s/p Maze procedure at time of MVR in 2011  . Permanent atrial fibrillation 02/25/2016  . Pleural effusion   . Polypharmacy 11/23/2015  . Pulmonary HTN ([place])   . 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 Septic shock ([place])   . Syncope and collapse 08/10/2017  . Thrombocytopenia ([place])   . Viral gastroenteritis 02/27/2016    Assessment: 47 yoM admitted 3/19 with history of atrial fibrillation w/ MVR on warfarin PTA. Last dose PTA on 3/19. CBC stable with Hgb 10.1, PLT 130. PTA dose: warfarin 2.5 mg PO daily  INR therapeutic at 2.2 this morning, down slightly from 2.7 yesterday. Patient didn't eat at all yesterday.   Goal of Therapy:  INR  2-3 Monitor platelets by anticoagulation protocol: Yes   Plan:  Give warfarin 2.5 mg PO x 1 tonight Monitor daily INR Monitor for s/sx of bleeding  Thank you for allowing pharmacy to be a part of this patient's care.  Tamela Gammon, PharmD 02/05/2019 9:15 AM PGY-1 Pharmacy Resident Direct Phone: [phone] Please check [email or web address] for unit-specific pharmacist phone numbers

## 2019-02-05 NOTE — Progress Notes (Signed)
OT Cancellation Note  Patient Details Name: Michael [name] MRN: [id number] DOB: Sep 25, 1961   Cancelled Treatment:    Reason Eval/Treat Not Completed: Patient at procedure or test/ unavailable.  Pt in HD.    [name], OTR/L [place] Pager [phone] Office [phone]   [name] M 02/05/2019, 12:54 PM

## 2019-02-05 NOTE — Progress Notes (Signed)
PROGRESS NOTE    Jakaleb Coletta Memos  QAS:[id number] DOB: [birth year] DOA: 02/03/2019 PCP: Charlott Rakes, MD   Brief Narrative: 58 year old with history of diabetes mellitus type 2, combined systolic and diastolic CHF, essential hypertension, mitral valve replacement with porcine valve on Coumadin, paroxysmal atrial fibrillation status post maze, coronary artery disease, cirrhosis, leukocytoclastic vasculitis with chronic purpuric rash, thrombocytopenia came to the hospital after sustaining a fall at home.  He was found to have low blood pressure as well.  No obvious evidence of infection.  Chronically his blood pressure runs in 90s for which she is on midodrine.  Due to his fall he also sustained left wrist fracture and was seen by orthopedic who recommended conservative management, splint and follow-up outpatient.  Chest x-ray showed signs of vascular congestion and right-sided pleural effusion.    Nephrology was consulted, had 1 session of dialysis yesterday.  Plan for another session today and discharge thereafter.  PT/OT recommends SNF but will likely transition home with home health.    Assessment & Plan:   Principal Problem:   Fall Active Problems:   Type II diabetes mellitus with renal manifestations (HCC)   Chronic atrial fibrillation   Congestive heart failure (CHF) (HCC)   HLD (hyperlipidemia)   ESRD (end stage renal disease) on dialysis (HCC)   Left wrist fracture, closed, initial encounter   Hypotension   Mechanical fall leading to left wrist fracture - Likely from intravascular volume depletion and chronic hypotension.  Currently blood pressure is at the baseline.  Orthopedic recommends splint and nonweightbearing with outpatient follow-up in 1 week.  Continue pain control in the meantime.  Patient will follow with Dr. Fredna Dow  Bilateral pleural effusion; stable.  - Secondary to fluid overload.  No acute respiratory compromise.  Currently saturating greater than 90% on 2 L  nasal cannula.  Chronically uses 2 L at home.  Dialysis should help But most of this is likely chronic  Hypotension, chronic -Patient chronically is on midodrine 3 times daily 10 mg.  Can have additional dose during dialysis. I have advised the daughter to time this medication prior to his dialysis session.  Chest x-ray suggestive of pulmonary vascular congestion and pleural effusion.  ESRD on hemodialysis Monday Wednesday Friday Saturday (4days/week) - Had a session of dialysis which he tolerated on 3/20, plans for another session of dialysis today prior to discharging him.  Diabetes mellitus type 2 - Diet controlled.  Closely monitor this outpatient.  Chronic combined diastolic and systolic congestive heart, class II -Overall appears to be euvolemic.  He does have some effusion which likely is chronic.  Plans for dialysis today.  Chronic atrial fibrillation status post Maze procedure Status post mitral valve replacement with porcine valve - Continue Coumadin.  Resume home medications. INR 2.7  Coronary artery disease - On Coumadin and statin.  Leuko-classic vasculitis with thrombocytopenia chronic purpuric rash -No obvious signs of bleeding.  Continue to monitor.  Due to generalized weakness therapy is recommending skilled nursing facility otherwise 24-hour supervision at home with home health.  Arrangements made.  DVT prophylaxis: Coumadin Code Status: Full code Family Communication: None at bedside Disposition Plan: Transition patient home with home health  Consultants:   Nephrology  Procedures:   None  Antimicrobials:   None   Subjective: Patient is any complaints besides his left upper arm pain secondary to fracture.  Review of Systems Otherwise negative except as per HPI, including: General = no fevers, chills, dizziness, malaise, fatigue HEENT/EYES = negative for pain, redness,  loss of vision, double vision, blurred vision, loss of hearing, sore  throat, hoarseness, dysphagia Cardiovascular= negative for chest pain, palpitation, murmurs, lower extremity swelling Respiratory/lungs= negative for shortness of breath, cough, hemoptysis, wheezing, mucus production Gastrointestinal= negative for nausea, vomiting,, abdominal pain, melena, hematemesis Genitourinary= negative for Dysuria, Hematuria, Change in Urinary Frequency MSK = Negative for arthralgia, myalgias, Back Pain, Joint swelling  Neurology= Negative for headache, seizures, numbness, tingling  Psychiatry= Negative for anxiety, depression, suicidal and homocidal ideation Allergy/Immunology= Medication/Food allergy as listed  Skin= Negative for Rash, lesions, ulcers, itching   Objective: Vitals:   02/05/19 0435 02/05/19 0533 02/05/19 0600 02/05/19 1137  BP:  107/85 (!) 86/57 (!) 84/53  Pulse:   61   Resp:  (!) 21 19 15   Temp:  98.2 F (36.8 C)    TempSrc:  Oral    SpO2:  98% (!) 88%   Weight: 67.1 kg     Height:        Intake/Output Summary (Last 24 hours) at 02/05/2019 1149 Last data filed at 02/05/2019 0030 Gross per 24 hour  Intake 120 ml  Output 2026 ml  Net -1906 ml   Filed Weights   02/04/19 1235 02/04/19 1648 02/05/19 0435  Weight: 67.8 kg 65.8 kg 67.1 kg    Examination: Constitutional: NAD, calm, comfortable Eyes: PERRL, lids and conjunctivae normal ENMT: Mucous membranes are moist. Posterior pharynx clear of any exudate or lesions.Normal dentition.  Neck: normal, supple, no masses, no thyromegaly Respiratory: clear to auscultation bilaterally, no wheezing, no crackles. Normal respiratory effort. No accessory muscle use.  Cardiovascular: Regular rate and rhythm, no murmurs / rubs / gallops. No extremity edema. 2+ pedal pulses. No carotid bruits.  Abdomen: no tenderness, no masses palpated. No hepatosplenomegaly. Bowel sounds positive.  Musculoskeletal: Left wrist fracture, splint in place. Skin: no rashes, lesions, ulcers. No induration Neurologic: CN  2-12 grossly intact. Sensation intact, DTR normal. Strength 4/5 in all 4.  Psychiatric: Normal judgment and insight. Alert and oriented x 3. Normal mood.     Data Reviewed:   CBC: Recent Labs  Lab 02/03/19 1350 02/04/19 0349  WBC 6.5 6.3  NEUTROABS 4.1  --   HGB 11.5* 10.1*  HCT 36.3* 32.8*  MCV 83.8 83.7  PLT 136* 948*   Basic Metabolic Panel: Recent Labs  Lab 02/03/19 1416 02/04/19 0349  NA 132* 132*  K 3.8 3.7  CL 96* 96*  CO2 27 24  GLUCOSE 93 83  BUN 19 25*  CREATININE 5.59* 6.56*  CALCIUM 9.1 9.0  PHOS  --  2.8   GFR: Estimated Creatinine Clearance: 10.2 mL/min (A) (by C-G formula based on SCr of 6.56 mg/dL (H)). Liver Function Tests: Recent Labs  Lab 02/03/19 1416 02/04/19 0349  AST 40  --   ALT 26  --   ALKPHOS 162*  --   BILITOT 1.4*  --   PROT 8.5*  --   ALBUMIN 3.5 3.3*   Recent Labs  Lab 02/03/19 1350  LIPASE 39   No results for input(s): AMMONIA in the last 168 hours. Coagulation Profile: Recent Labs  Lab 01/31/19 02/03/19 1350 02/04/19 0349 02/05/19 0546  INR 4.4* 2.6* 2.7* 2.2*   Cardiac Enzymes: No results for input(s): CKTOTAL, CKMB, CKMBINDEX, TROPONINI in the last 168 hours. BNP (last 3 results) No results for input(s): PROBNP in the last 8760 hours. HbA1C: No results for input(s): HGBA1C in the last 72 hours. CBG: Recent Labs  Lab 02/03/19 1452 02/04/19 1128 02/04/19 1738  GLUCAP 84 105* 65*   Lipid Profile: No results for input(s): CHOL, HDL, LDLCALC, TRIG, CHOLHDL, LDLDIRECT in the last 72 hours. Thyroid Function Tests: No results for input(s): TSH, T4TOTAL, FREET4, T3FREE, THYROIDAB in the last 72 hours. Anemia Panel: No results for input(s): VITAMINB12, FOLATE, FERRITIN, TIBC, IRON, RETICCTPCT in the last 72 hours. Sepsis Labs: Recent Labs  Lab 02/03/19 1850  LATICACIDVEN 1.3    Recent Results (from the past 240 hour(s))  Blood culture (routine x 2)     Status: None (Preliminary result)   Collection  Time: 02/03/19  6:50 PM  Result Value Ref Range Status   Specimen Description BLOOD RIGHT HAND  Final   Special Requests   Final    BOTTLES DRAWN AEROBIC AND ANAEROBIC Blood Culture adequate volume   Culture   Final    NO GROWTH 2 DAYS Performed at [facility], [address], [place], [place] [address]    Report Status PENDING  Incomplete  MRSA PCR Screening     Status: None   Collection Time: 02/03/19  8:37 PM  Result Value Ref Range Status   MRSA by PCR NEGATIVE NEGATIVE Final    Comment:        The GeneXpert MRSA Assay (FDA approved for NASAL specimens only), is one component of a comprehensive MRSA colonization surveillance program. It is not intended to diagnose MRSA infection nor to guide or monitor treatment for MRSA infections. Performed at [facility], [place] [address] [place], [place] [address]          Radiology Studies: Dg Chest 1 View  Result Date: 02/03/2019 CLINICAL DATA:  [place] at home EXAM: CHEST  1 VIEW COMPARISON:  09/13/2018 FINDINGS: Post sternotomy changes. Marked cardiomegaly. Small mildly loculated right pleural effusion. Overall decreased compared to prior. Aortic atherosclerosis. No pneumothorax. IMPRESSION: 1. Cardiomegaly with central vascular congestion and small loculated right pleural effusion. 2. Hazy atelectasis or infiltrate at the right base. Electronically Signed   By: Donavan Foil M.D.   On: 02/03/2019 14:46   Dg Pelvis 1-2 Views  Result Date: 02/03/2019 CLINICAL DATA:  [place] at home EXAM: PELVIS - 1-2 VIEW COMPARISON:  CT 07/13/2017 FINDINGS: SI joints are non widened. Pubic symphysis and rami are intact. No fracture or malalignment. Vascular calcifications. IMPRESSION: No acute osseous abnormality. Electronically Signed   By: Donavan Foil M.D.   On: 02/03/2019 14:47   Dg Wrist Complete Left  Result Date: 02/03/2019 CLINICAL DATA:  Fall with deformity a EXAM: LEFT WRIST - COMPLETE 3+ VIEW COMPARISON:  06/01/2017 FINDINGS:  Acute comminuted and impacted intra-articular distal radius fracture. No subluxation. Additional acute mildly comminuted fracture involving the distal ulna and ulnar styloid. IMPRESSION: Acute comminuted and impacted intra-articular distal radius fracture with additional acute mildly comminuted distal ulna fracture Electronically Signed   By: Donavan Foil M.D.   On: 02/03/2019 14:45   Ct Head Wo Contrast  Result Date: 02/03/2019 CLINICAL DATA:  Cervical spine trauma. EXAM: CT HEAD WITHOUT CONTRAST CT CERVICAL SPINE WITHOUT CONTRAST TECHNIQUE: Multidetector CT imaging of the head and cervical spine was performed following the standard protocol without intravenous contrast. Multiplanar CT image reconstructions of the cervical spine were also generated. COMPARISON:  Head CT August 10, 2017 FINDINGS: CT HEAD FINDINGS Brain: No evidence of acute infarction, hemorrhage, hydrocephalus, extra-axial collection or mass lesion/mass effect. There is chronic diffuse atrophy. Vascular: No hyperdense vessel or unexpected calcification. Skull: Normal. Negative for fracture or focal lesion. Sinuses/Orbits: No acute  finding. Other: None. CT CERVICAL SPINE FINDINGS Alignment: Normal. Skull base and vertebrae: No acute fracture. No primary bone lesion or focal pathologic process. Soft tissues and spinal canal: No prevertebral fluid or swelling. No visible canal hematoma. Disc levels: Mild degenerative joint changes with osteophytosis are identified in the mid to lower cervical spine. Upper chest: Negative. Other: None. IMPRESSION: No focal acute intracranial abnormality identified. No acute fracture or dislocation of cervical spine. Electronically Signed   By: Abelardo Diesel M.D.   On: 02/03/2019 14:24   Ct Cervical Spine Wo Contrast  Result Date: 02/03/2019 CLINICAL DATA:  Cervical spine trauma. EXAM: CT HEAD WITHOUT CONTRAST CT CERVICAL SPINE WITHOUT CONTRAST TECHNIQUE: Multidetector CT imaging of the head and cervical  spine was performed following the standard protocol without intravenous contrast. Multiplanar CT image reconstructions of the cervical spine were also generated. COMPARISON:  Head CT August 10, 2017 FINDINGS: CT HEAD FINDINGS Brain: No evidence of acute infarction, hemorrhage, hydrocephalus, extra-axial collection or mass lesion/mass effect. There is chronic diffuse atrophy. Vascular: No hyperdense vessel or unexpected calcification. Skull: Normal. Negative for fracture or focal lesion. Sinuses/Orbits: No acute finding. Other: None. CT CERVICAL SPINE FINDINGS Alignment: Normal. Skull base and vertebrae: No acute fracture. No primary bone lesion or focal pathologic process. Soft tissues and spinal canal: No prevertebral fluid or swelling. No visible canal hematoma. Disc levels: Mild degenerative joint changes with osteophytosis are identified in the mid to lower cervical spine. Upper chest: Negative. Other: None. IMPRESSION: No focal acute intracranial abnormality identified. No acute fracture or dislocation of cervical spine. Electronically Signed   By: Abelardo Diesel M.D.   On: 02/03/2019 14:24        Scheduled Meds: . atorvastatin  20 mg Oral Daily  . calcium acetate  667 mg Oral TID WC  . Chlorhexidine Gluconate Cloth  6 each Topical Q0600  . DULoxetine  60 mg Oral Daily  . gabapentin  200 mg Oral QHS  . midodrine  10 mg Oral TID WC  . [START ON 02/07/2019] midodrine  10 mg Oral Q M,W,F-HD  . sodium chloride flush  3 mL Intravenous Q12H  . warfarin  2.5 mg Oral ONCE-1800  . Warfarin - Pharmacist Dosing Inpatient   Does not apply q1800   Continuous Infusions:   LOS: 0 days   Time spent= 22 mins    Adaline Trejos Arsenio Loader, MD [facility]  If 7PM-7AM, please contact night-coverage [email or web address] 02/05/2019, 11:49 AM

## 2019-02-05 NOTE — Procedures (Signed)
   I was present at this dialysis session, have reviewed the session itself and made  appropriate changes Kelly Splinter MD [place] pager [phone]   02/05/2019, 2:34 PM

## 2019-02-07 DIAGNOSIS — E876 Hypokalemia: Secondary | ICD-10-CM | POA: Diagnosis not present

## 2019-02-07 DIAGNOSIS — D631 Anemia in chronic kidney disease: Secondary | ICD-10-CM | POA: Diagnosis not present

## 2019-02-07 DIAGNOSIS — N186 End stage renal disease: Secondary | ICD-10-CM | POA: Diagnosis not present

## 2019-02-07 DIAGNOSIS — E119 Type 2 diabetes mellitus without complications: Secondary | ICD-10-CM | POA: Diagnosis not present

## 2019-02-07 DIAGNOSIS — Z992 Dependence on renal dialysis: Secondary | ICD-10-CM | POA: Diagnosis not present

## 2019-02-07 DIAGNOSIS — N2581 Secondary hyperparathyroidism of renal origin: Secondary | ICD-10-CM | POA: Diagnosis not present

## 2019-02-08 ENCOUNTER — Telehealth: Payer: Self-pay | Admitting: Family Medicine

## 2019-02-08 DIAGNOSIS — S52602D Unspecified fracture of lower end of left ulna, subsequent encounter for closed fracture with routine healing: Secondary | ICD-10-CM | POA: Diagnosis not present

## 2019-02-08 DIAGNOSIS — Z7901 Long term (current) use of anticoagulants: Secondary | ICD-10-CM | POA: Diagnosis not present

## 2019-02-08 DIAGNOSIS — I132 Hypertensive heart and chronic kidney disease with heart failure and with stage 5 chronic kidney disease, or end stage renal disease: Secondary | ICD-10-CM | POA: Diagnosis not present

## 2019-02-08 DIAGNOSIS — E1122 Type 2 diabetes mellitus with diabetic chronic kidney disease: Secondary | ICD-10-CM | POA: Diagnosis not present

## 2019-02-08 DIAGNOSIS — N186 End stage renal disease: Secondary | ICD-10-CM | POA: Diagnosis not present

## 2019-02-08 DIAGNOSIS — Z9981 Dependence on supplemental oxygen: Secondary | ICD-10-CM | POA: Diagnosis not present

## 2019-02-08 DIAGNOSIS — I482 Chronic atrial fibrillation, unspecified: Secondary | ICD-10-CM | POA: Diagnosis not present

## 2019-02-08 DIAGNOSIS — W19XXXD Unspecified fall, subsequent encounter: Secondary | ICD-10-CM | POA: Diagnosis not present

## 2019-02-08 DIAGNOSIS — Z952 Presence of prosthetic heart valve: Secondary | ICD-10-CM | POA: Diagnosis not present

## 2019-02-08 DIAGNOSIS — K7460 Unspecified cirrhosis of liver: Secondary | ICD-10-CM | POA: Diagnosis not present

## 2019-02-08 DIAGNOSIS — S52612D Displaced fracture of left ulna styloid process, subsequent encounter for closed fracture with routine healing: Secondary | ICD-10-CM | POA: Diagnosis not present

## 2019-02-08 DIAGNOSIS — I5042 Chronic combined systolic (congestive) and diastolic (congestive) heart failure: Secondary | ICD-10-CM | POA: Diagnosis not present

## 2019-02-08 DIAGNOSIS — I2510 Atherosclerotic heart disease of native coronary artery without angina pectoris: Secondary | ICD-10-CM | POA: Diagnosis not present

## 2019-02-08 DIAGNOSIS — Z992 Dependence on renal dialysis: Secondary | ICD-10-CM | POA: Diagnosis not present

## 2019-02-08 LAB — CULTURE, BLOOD (ROUTINE X 2)
Culture: NO GROWTH
Special Requests: ADEQUATE

## 2019-02-08 NOTE — Telephone Encounter (Signed)
Merry Proud from [facility] called to give verbal orders physical therapy 1 a week for 4 weeks for strengthening gate and balance training

## 2019-02-09 DIAGNOSIS — N186 End stage renal disease: Secondary | ICD-10-CM | POA: Diagnosis not present

## 2019-02-09 DIAGNOSIS — E119 Type 2 diabetes mellitus without complications: Secondary | ICD-10-CM | POA: Diagnosis not present

## 2019-02-09 DIAGNOSIS — E876 Hypokalemia: Secondary | ICD-10-CM | POA: Diagnosis not present

## 2019-02-09 DIAGNOSIS — Z992 Dependence on renal dialysis: Secondary | ICD-10-CM | POA: Diagnosis not present

## 2019-02-09 DIAGNOSIS — I482 Chronic atrial fibrillation, unspecified: Secondary | ICD-10-CM | POA: Diagnosis not present

## 2019-02-09 DIAGNOSIS — D631 Anemia in chronic kidney disease: Secondary | ICD-10-CM | POA: Diagnosis not present

## 2019-02-09 DIAGNOSIS — S52615A Nondisplaced fracture of left ulna styloid process, initial encounter for closed fracture: Secondary | ICD-10-CM | POA: Diagnosis not present

## 2019-02-09 DIAGNOSIS — N2581 Secondary hyperparathyroidism of renal origin: Secondary | ICD-10-CM | POA: Diagnosis not present

## 2019-02-09 DIAGNOSIS — S52572A Other intraarticular fracture of lower end of left radius, initial encounter for closed fracture: Secondary | ICD-10-CM | POA: Diagnosis not present

## 2019-02-09 LAB — PROTIME-INR: INR: 2.3 — AB (ref <=1.1)

## 2019-02-09 NOTE — Telephone Encounter (Signed)
Merry Proud was called and given verbal orders for PT.

## 2019-02-10 ENCOUNTER — Other Ambulatory Visit: Payer: Self-pay | Admitting: Cardiology

## 2019-02-10 ENCOUNTER — Encounter: Payer: Self-pay | Admitting: Cardiovascular Disease

## 2019-02-10 DIAGNOSIS — I059 Rheumatic mitral valve disease, unspecified: Secondary | ICD-10-CM

## 2019-02-10 DIAGNOSIS — S52612D Displaced fracture of left ulna styloid process, subsequent encounter for closed fracture with routine healing: Secondary | ICD-10-CM | POA: Diagnosis not present

## 2019-02-10 DIAGNOSIS — S52602D Unspecified fracture of lower end of left ulna, subsequent encounter for closed fracture with routine healing: Secondary | ICD-10-CM | POA: Diagnosis not present

## 2019-02-10 DIAGNOSIS — I2510 Atherosclerotic heart disease of native coronary artery without angina pectoris: Secondary | ICD-10-CM | POA: Diagnosis not present

## 2019-02-10 DIAGNOSIS — I079 Rheumatic tricuspid valve disease, unspecified: Secondary | ICD-10-CM

## 2019-02-10 DIAGNOSIS — I132 Hypertensive heart and chronic kidney disease with heart failure and with stage 5 chronic kidney disease, or end stage renal disease: Secondary | ICD-10-CM | POA: Diagnosis not present

## 2019-02-10 DIAGNOSIS — E1122 Type 2 diabetes mellitus with diabetic chronic kidney disease: Secondary | ICD-10-CM | POA: Diagnosis not present

## 2019-02-10 DIAGNOSIS — Z5181 Encounter for therapeutic drug level monitoring: Secondary | ICD-10-CM

## 2019-02-10 DIAGNOSIS — I5042 Chronic combined systolic (congestive) and diastolic (congestive) heart failure: Secondary | ICD-10-CM | POA: Diagnosis not present

## 2019-02-10 NOTE — Progress Notes (Signed)
This encounter was created in error - please disregard.

## 2019-02-11 DIAGNOSIS — Z992 Dependence on renal dialysis: Secondary | ICD-10-CM | POA: Diagnosis not present

## 2019-02-11 DIAGNOSIS — E119 Type 2 diabetes mellitus without complications: Secondary | ICD-10-CM | POA: Diagnosis not present

## 2019-02-11 DIAGNOSIS — E876 Hypokalemia: Secondary | ICD-10-CM | POA: Diagnosis not present

## 2019-02-11 DIAGNOSIS — N186 End stage renal disease: Secondary | ICD-10-CM | POA: Diagnosis not present

## 2019-02-11 DIAGNOSIS — N2581 Secondary hyperparathyroidism of renal origin: Secondary | ICD-10-CM | POA: Diagnosis not present

## 2019-02-11 DIAGNOSIS — D631 Anemia in chronic kidney disease: Secondary | ICD-10-CM | POA: Diagnosis not present

## 2019-02-12 DIAGNOSIS — Z992 Dependence on renal dialysis: Secondary | ICD-10-CM | POA: Diagnosis not present

## 2019-02-12 DIAGNOSIS — E876 Hypokalemia: Secondary | ICD-10-CM | POA: Diagnosis not present

## 2019-02-12 DIAGNOSIS — N186 End stage renal disease: Secondary | ICD-10-CM | POA: Diagnosis not present

## 2019-02-12 DIAGNOSIS — N2581 Secondary hyperparathyroidism of renal origin: Secondary | ICD-10-CM | POA: Diagnosis not present

## 2019-02-12 DIAGNOSIS — D631 Anemia in chronic kidney disease: Secondary | ICD-10-CM | POA: Diagnosis not present

## 2019-02-12 DIAGNOSIS — E119 Type 2 diabetes mellitus without complications: Secondary | ICD-10-CM | POA: Diagnosis not present

## 2019-02-14 DIAGNOSIS — E119 Type 2 diabetes mellitus without complications: Secondary | ICD-10-CM | POA: Diagnosis not present

## 2019-02-14 DIAGNOSIS — I482 Chronic atrial fibrillation, unspecified: Secondary | ICD-10-CM | POA: Diagnosis not present

## 2019-02-14 DIAGNOSIS — N2581 Secondary hyperparathyroidism of renal origin: Secondary | ICD-10-CM | POA: Diagnosis not present

## 2019-02-14 DIAGNOSIS — N186 End stage renal disease: Secondary | ICD-10-CM | POA: Diagnosis not present

## 2019-02-14 DIAGNOSIS — E876 Hypokalemia: Secondary | ICD-10-CM | POA: Diagnosis not present

## 2019-02-14 DIAGNOSIS — D631 Anemia in chronic kidney disease: Secondary | ICD-10-CM | POA: Diagnosis not present

## 2019-02-14 DIAGNOSIS — Z992 Dependence on renal dialysis: Secondary | ICD-10-CM | POA: Diagnosis not present

## 2019-02-14 LAB — POCT INR: INR: 1.3 — AB (ref 2.0–3.0)

## 2019-02-15 ENCOUNTER — Ambulatory Visit (INDEPENDENT_AMBULATORY_CARE_PROVIDER_SITE_OTHER): Payer: Medicare Other | Admitting: Pharmacist

## 2019-02-15 DIAGNOSIS — I059 Rheumatic mitral valve disease, unspecified: Secondary | ICD-10-CM | POA: Diagnosis not present

## 2019-02-15 DIAGNOSIS — I079 Rheumatic tricuspid valve disease, unspecified: Secondary | ICD-10-CM

## 2019-02-15 DIAGNOSIS — Z5181 Encounter for therapeutic drug level monitoring: Secondary | ICD-10-CM

## 2019-02-16 DIAGNOSIS — S52612D Displaced fracture of left ulna styloid process, subsequent encounter for closed fracture with routine healing: Secondary | ICD-10-CM | POA: Diagnosis not present

## 2019-02-16 DIAGNOSIS — I509 Heart failure, unspecified: Secondary | ICD-10-CM | POA: Diagnosis not present

## 2019-02-16 DIAGNOSIS — S52602D Unspecified fracture of lower end of left ulna, subsequent encounter for closed fracture with routine healing: Secondary | ICD-10-CM | POA: Diagnosis not present

## 2019-02-16 DIAGNOSIS — I2510 Atherosclerotic heart disease of native coronary artery without angina pectoris: Secondary | ICD-10-CM | POA: Diagnosis not present

## 2019-02-16 DIAGNOSIS — I5022 Chronic systolic (congestive) heart failure: Secondary | ICD-10-CM | POA: Diagnosis not present

## 2019-02-16 DIAGNOSIS — I132 Hypertensive heart and chronic kidney disease with heart failure and with stage 5 chronic kidney disease, or end stage renal disease: Secondary | ICD-10-CM | POA: Diagnosis not present

## 2019-02-16 DIAGNOSIS — N186 End stage renal disease: Secondary | ICD-10-CM | POA: Diagnosis not present

## 2019-02-16 DIAGNOSIS — E876 Hypokalemia: Secondary | ICD-10-CM | POA: Diagnosis not present

## 2019-02-16 DIAGNOSIS — I50810 Right heart failure, unspecified: Secondary | ICD-10-CM | POA: Diagnosis not present

## 2019-02-16 DIAGNOSIS — N2581 Secondary hyperparathyroidism of renal origin: Secondary | ICD-10-CM | POA: Diagnosis not present

## 2019-02-16 DIAGNOSIS — D631 Anemia in chronic kidney disease: Secondary | ICD-10-CM | POA: Diagnosis not present

## 2019-02-16 DIAGNOSIS — E1122 Type 2 diabetes mellitus with diabetic chronic kidney disease: Secondary | ICD-10-CM | POA: Diagnosis not present

## 2019-02-16 DIAGNOSIS — I5042 Chronic combined systolic (congestive) and diastolic (congestive) heart failure: Secondary | ICD-10-CM | POA: Diagnosis not present

## 2019-02-16 DIAGNOSIS — Z992 Dependence on renal dialysis: Secondary | ICD-10-CM | POA: Diagnosis not present

## 2019-02-17 DIAGNOSIS — I2510 Atherosclerotic heart disease of native coronary artery without angina pectoris: Secondary | ICD-10-CM | POA: Diagnosis not present

## 2019-02-17 DIAGNOSIS — E1122 Type 2 diabetes mellitus with diabetic chronic kidney disease: Secondary | ICD-10-CM | POA: Diagnosis not present

## 2019-02-17 DIAGNOSIS — S52612D Displaced fracture of left ulna styloid process, subsequent encounter for closed fracture with routine healing: Secondary | ICD-10-CM | POA: Diagnosis not present

## 2019-02-17 DIAGNOSIS — I132 Hypertensive heart and chronic kidney disease with heart failure and with stage 5 chronic kidney disease, or end stage renal disease: Secondary | ICD-10-CM | POA: Diagnosis not present

## 2019-02-17 DIAGNOSIS — S52602D Unspecified fracture of lower end of left ulna, subsequent encounter for closed fracture with routine healing: Secondary | ICD-10-CM | POA: Diagnosis not present

## 2019-02-17 DIAGNOSIS — I5042 Chronic combined systolic (congestive) and diastolic (congestive) heart failure: Secondary | ICD-10-CM | POA: Diagnosis not present

## 2019-02-17 NOTE — Patient Instructions (Signed)
Description   4/2-spoke with pt after multiple calls and instructed him to take 2 tablets today then continue taking 1 tablet every day.  Recheck INR in 1 week. [facility] [phone] Pt's new number is [phone]

## 2019-02-18 ENCOUNTER — Telehealth: Payer: Self-pay | Admitting: Family Medicine

## 2019-02-18 DIAGNOSIS — E876 Hypokalemia: Secondary | ICD-10-CM | POA: Diagnosis not present

## 2019-02-18 DIAGNOSIS — I50810 Right heart failure, unspecified: Secondary | ICD-10-CM | POA: Diagnosis not present

## 2019-02-18 DIAGNOSIS — N186 End stage renal disease: Secondary | ICD-10-CM | POA: Diagnosis not present

## 2019-02-18 DIAGNOSIS — Z992 Dependence on renal dialysis: Secondary | ICD-10-CM | POA: Diagnosis not present

## 2019-02-18 DIAGNOSIS — N2581 Secondary hyperparathyroidism of renal origin: Secondary | ICD-10-CM | POA: Diagnosis not present

## 2019-02-18 DIAGNOSIS — D631 Anemia in chronic kidney disease: Secondary | ICD-10-CM | POA: Diagnosis not present

## 2019-02-18 NOTE — Telephone Encounter (Signed)
Lori from [facility] called to let patient PCP know that patient missed a visit.

## 2019-02-19 DIAGNOSIS — D631 Anemia in chronic kidney disease: Secondary | ICD-10-CM | POA: Diagnosis not present

## 2019-02-19 DIAGNOSIS — N2581 Secondary hyperparathyroidism of renal origin: Secondary | ICD-10-CM | POA: Diagnosis not present

## 2019-02-19 DIAGNOSIS — N186 End stage renal disease: Secondary | ICD-10-CM | POA: Diagnosis not present

## 2019-02-19 DIAGNOSIS — I50810 Right heart failure, unspecified: Secondary | ICD-10-CM | POA: Diagnosis not present

## 2019-02-19 DIAGNOSIS — Z992 Dependence on renal dialysis: Secondary | ICD-10-CM | POA: Diagnosis not present

## 2019-02-19 DIAGNOSIS — E876 Hypokalemia: Secondary | ICD-10-CM | POA: Diagnosis not present

## 2019-02-21 ENCOUNTER — Telehealth: Payer: Self-pay

## 2019-02-21 ENCOUNTER — Telehealth: Payer: Self-pay | Admitting: Physician Assistant

## 2019-02-21 DIAGNOSIS — N186 End stage renal disease: Secondary | ICD-10-CM | POA: Diagnosis not present

## 2019-02-21 DIAGNOSIS — E876 Hypokalemia: Secondary | ICD-10-CM | POA: Diagnosis not present

## 2019-02-21 DIAGNOSIS — D631 Anemia in chronic kidney disease: Secondary | ICD-10-CM | POA: Diagnosis not present

## 2019-02-21 DIAGNOSIS — N2581 Secondary hyperparathyroidism of renal origin: Secondary | ICD-10-CM | POA: Diagnosis not present

## 2019-02-21 DIAGNOSIS — Z992 Dependence on renal dialysis: Secondary | ICD-10-CM | POA: Diagnosis not present

## 2019-02-21 DIAGNOSIS — I50810 Right heart failure, unspecified: Secondary | ICD-10-CM | POA: Diagnosis not present

## 2019-02-21 DIAGNOSIS — I482 Chronic atrial fibrillation, unspecified: Secondary | ICD-10-CM | POA: Diagnosis not present

## 2019-02-21 LAB — PROTIME-INR: INR: 6 — AB (ref 0.9–1.1)

## 2019-02-21 NOTE — Telephone Encounter (Signed)
New Message     Michael [name] is calling to let the office know that the interpreter will call at the pts appt time  They were wondering if their is a direct line they can call  Please call back before 5 pm today so she can give the phone number to the Interpreter agency

## 2019-02-21 NOTE — Telephone Encounter (Signed)

## 2019-02-21 NOTE — Telephone Encounter (Signed)
Will route to PCP 

## 2019-02-22 ENCOUNTER — Inpatient Hospital Stay: Payer: Medicare Other | Admitting: Family Medicine

## 2019-02-23 ENCOUNTER — Ambulatory Visit (INDEPENDENT_AMBULATORY_CARE_PROVIDER_SITE_OTHER): Payer: Medicare Other | Admitting: Pharmacist

## 2019-02-23 DIAGNOSIS — Z5181 Encounter for therapeutic drug level monitoring: Secondary | ICD-10-CM

## 2019-02-23 DIAGNOSIS — S52602D Unspecified fracture of lower end of left ulna, subsequent encounter for closed fracture with routine healing: Secondary | ICD-10-CM | POA: Diagnosis not present

## 2019-02-23 DIAGNOSIS — I079 Rheumatic tricuspid valve disease, unspecified: Secondary | ICD-10-CM | POA: Diagnosis not present

## 2019-02-23 DIAGNOSIS — I2510 Atherosclerotic heart disease of native coronary artery without angina pectoris: Secondary | ICD-10-CM | POA: Diagnosis not present

## 2019-02-23 DIAGNOSIS — I132 Hypertensive heart and chronic kidney disease with heart failure and with stage 5 chronic kidney disease, or end stage renal disease: Secondary | ICD-10-CM | POA: Diagnosis not present

## 2019-02-23 DIAGNOSIS — I059 Rheumatic mitral valve disease, unspecified: Secondary | ICD-10-CM | POA: Diagnosis not present

## 2019-02-23 DIAGNOSIS — E1122 Type 2 diabetes mellitus with diabetic chronic kidney disease: Secondary | ICD-10-CM | POA: Diagnosis not present

## 2019-02-23 DIAGNOSIS — S52612D Displaced fracture of left ulna styloid process, subsequent encounter for closed fracture with routine healing: Secondary | ICD-10-CM | POA: Diagnosis not present

## 2019-02-23 DIAGNOSIS — I5042 Chronic combined systolic (congestive) and diastolic (congestive) heart failure: Secondary | ICD-10-CM | POA: Diagnosis not present

## 2019-02-24 ENCOUNTER — Other Ambulatory Visit: Payer: Self-pay | Admitting: Cardiology

## 2019-02-24 ENCOUNTER — Telehealth: Payer: Self-pay | Admitting: Family Medicine

## 2019-02-24 NOTE — Telephone Encounter (Signed)
Michael [name] called due to the Patient refusing PT for this week. Please follow up.

## 2019-02-24 NOTE — Telephone Encounter (Signed)
If he declines PT this week we will have to respect his decision

## 2019-02-24 NOTE — Patient Instructions (Signed)
Description   Spoke with pt and instructed pt to hold Coumadin for next 3 days then continue taking 1 tablet every day.  Recheck INR in 1 week. [facility] [phone] Pt's new number is [phone]

## 2019-02-25 LAB — POCT INR

## 2019-02-26 ENCOUNTER — Inpatient Hospital Stay (HOSPITAL_COMMUNITY)
Admission: EM | Admit: 2019-02-26 | Discharge: 2019-03-08 | DRG: 871 | Disposition: A | Discharge status: Hospice | Payer: Medicare Other | Attending: Student | Admitting: Student

## 2019-02-26 ENCOUNTER — Emergency Department (HOSPITAL_COMMUNITY): Payer: Medicare Other

## 2019-02-26 ENCOUNTER — Inpatient Hospital Stay (HOSPITAL_COMMUNITY): Payer: Medicare Other

## 2019-02-26 ENCOUNTER — Other Ambulatory Visit: Payer: Self-pay

## 2019-02-26 DIAGNOSIS — S52602D Unspecified fracture of lower end of left ulna, subsequent encounter for closed fracture with routine healing: Secondary | ICD-10-CM

## 2019-02-26 DIAGNOSIS — X58XXXD Exposure to other specified factors, subsequent encounter: Secondary | ICD-10-CM | POA: Diagnosis not present

## 2019-02-26 DIAGNOSIS — Z952 Presence of prosthetic heart valve: Secondary | ICD-10-CM | POA: Diagnosis not present

## 2019-02-26 DIAGNOSIS — G9340 Encephalopathy, unspecified: Secondary | ICD-10-CM

## 2019-02-26 DIAGNOSIS — I959 Hypotension, unspecified: Secondary | ICD-10-CM | POA: Diagnosis not present

## 2019-02-26 DIAGNOSIS — E1122 Type 2 diabetes mellitus with diabetic chronic kidney disease: Secondary | ICD-10-CM | POA: Diagnosis present

## 2019-02-26 DIAGNOSIS — R52 Pain, unspecified: Secondary | ICD-10-CM | POA: Diagnosis not present

## 2019-02-26 DIAGNOSIS — I38 Endocarditis, valve unspecified: Secondary | ICD-10-CM | POA: Diagnosis not present

## 2019-02-26 DIAGNOSIS — G4733 Obstructive sleep apnea (adult) (pediatric): Secondary | ICD-10-CM | POA: Diagnosis present

## 2019-02-26 DIAGNOSIS — T148XXA Other injury of unspecified body region, initial encounter: Secondary | ICD-10-CM

## 2019-02-26 DIAGNOSIS — I4510 Unspecified right bundle-branch block: Secondary | ICD-10-CM | POA: Diagnosis present

## 2019-02-26 DIAGNOSIS — W010XXS Fall on same level from slipping, tripping and stumbling without subsequent striking against object, sequela: Secondary | ICD-10-CM | POA: Diagnosis present

## 2019-02-26 DIAGNOSIS — G9341 Metabolic encephalopathy: Secondary | ICD-10-CM | POA: Diagnosis not present

## 2019-02-26 DIAGNOSIS — N19 Unspecified kidney failure: Secondary | ICD-10-CM | POA: Diagnosis not present

## 2019-02-26 DIAGNOSIS — J189 Pneumonia, unspecified organism: Secondary | ICD-10-CM | POA: Diagnosis present

## 2019-02-26 DIAGNOSIS — D631 Anemia in chronic kidney disease: Secondary | ICD-10-CM | POA: Diagnosis present

## 2019-02-26 DIAGNOSIS — J181 Lobar pneumonia, unspecified organism: Secondary | ICD-10-CM | POA: Diagnosis not present

## 2019-02-26 DIAGNOSIS — I509 Heart failure, unspecified: Secondary | ICD-10-CM | POA: Diagnosis not present

## 2019-02-26 DIAGNOSIS — I083 Combined rheumatic disorders of mitral, aortic and tricuspid valves: Secondary | ICD-10-CM | POA: Diagnosis present

## 2019-02-26 DIAGNOSIS — R451 Restlessness and agitation: Secondary | ICD-10-CM | POA: Diagnosis not present

## 2019-02-26 DIAGNOSIS — J90 Pleural effusion, not elsewhere classified: Secondary | ICD-10-CM

## 2019-02-26 DIAGNOSIS — S62102S Fracture of unspecified carpal bone, left wrist, sequela: Secondary | ICD-10-CM | POA: Diagnosis not present

## 2019-02-26 DIAGNOSIS — S52502D Unspecified fracture of the lower end of left radius, subsequent encounter for closed fracture with routine healing: Secondary | ICD-10-CM | POA: Diagnosis not present

## 2019-02-26 DIAGNOSIS — Z66 Do not resuscitate: Secondary | ICD-10-CM | POA: Diagnosis present

## 2019-02-26 DIAGNOSIS — I452 Bifascicular block: Secondary | ICD-10-CM | POA: Diagnosis present

## 2019-02-26 DIAGNOSIS — Z953 Presence of xenogenic heart valve: Secondary | ICD-10-CM

## 2019-02-26 DIAGNOSIS — M25539 Pain in unspecified wrist: Secondary | ICD-10-CM | POA: Diagnosis not present

## 2019-02-26 DIAGNOSIS — I77810 Thoracic aortic ectasia: Secondary | ICD-10-CM | POA: Diagnosis present

## 2019-02-26 DIAGNOSIS — Z87891 Personal history of nicotine dependence: Secondary | ICD-10-CM

## 2019-02-26 DIAGNOSIS — R21 Rash and other nonspecific skin eruption: Secondary | ICD-10-CM | POA: Diagnosis present

## 2019-02-26 DIAGNOSIS — E669 Obesity, unspecified: Secondary | ICD-10-CM | POA: Diagnosis present

## 2019-02-26 DIAGNOSIS — S52502A Unspecified fracture of the lower end of left radius, initial encounter for closed fracture: Secondary | ICD-10-CM | POA: Diagnosis not present

## 2019-02-26 DIAGNOSIS — Z7901 Long term (current) use of anticoagulants: Secondary | ICD-10-CM

## 2019-02-26 DIAGNOSIS — I480 Paroxysmal atrial fibrillation: Secondary | ICD-10-CM | POA: Diagnosis not present

## 2019-02-26 DIAGNOSIS — I472 Ventricular tachycardia: Secondary | ICD-10-CM | POA: Diagnosis present

## 2019-02-26 DIAGNOSIS — R7881 Bacteremia: Secondary | ICD-10-CM

## 2019-02-26 DIAGNOSIS — M2550 Pain in unspecified joint: Secondary | ICD-10-CM | POA: Diagnosis not present

## 2019-02-26 DIAGNOSIS — A4101 Sepsis due to Methicillin susceptible Staphylococcus aureus: Secondary | ICD-10-CM | POA: Diagnosis present

## 2019-02-26 DIAGNOSIS — Z9115 Patient's noncompliance with renal dialysis: Secondary | ICD-10-CM | POA: Diagnosis not present

## 2019-02-26 DIAGNOSIS — S62102D Fracture of unspecified carpal bone, left wrist, subsequent encounter for fracture with routine healing: Secondary | ICD-10-CM

## 2019-02-26 DIAGNOSIS — X58XXXA Exposure to other specified factors, initial encounter: Secondary | ICD-10-CM | POA: Diagnosis not present

## 2019-02-26 DIAGNOSIS — D689 Coagulation defect, unspecified: Secondary | ICD-10-CM | POA: Diagnosis not present

## 2019-02-26 DIAGNOSIS — I272 Pulmonary hypertension, unspecified: Secondary | ICD-10-CM | POA: Diagnosis present

## 2019-02-26 DIAGNOSIS — D696 Thrombocytopenia, unspecified: Secondary | ICD-10-CM | POA: Diagnosis present

## 2019-02-26 DIAGNOSIS — I4821 Permanent atrial fibrillation: Secondary | ICD-10-CM | POA: Diagnosis present

## 2019-02-26 DIAGNOSIS — S62102A Fracture of unspecified carpal bone, left wrist, initial encounter for closed fracture: Secondary | ICD-10-CM | POA: Diagnosis not present

## 2019-02-26 DIAGNOSIS — G92 Toxic encephalopathy: Secondary | ICD-10-CM | POA: Diagnosis present

## 2019-02-26 DIAGNOSIS — I4891 Unspecified atrial fibrillation: Secondary | ICD-10-CM | POA: Diagnosis not present

## 2019-02-26 DIAGNOSIS — K219 Gastro-esophageal reflux disease without esophagitis: Secondary | ICD-10-CM | POA: Diagnosis present

## 2019-02-26 DIAGNOSIS — I5022 Chronic systolic (congestive) heart failure: Secondary | ICD-10-CM | POA: Diagnosis present

## 2019-02-26 DIAGNOSIS — I132 Hypertensive heart and chronic kidney disease with heart failure and with stage 5 chronic kidney disease, or end stage renal disease: Secondary | ICD-10-CM | POA: Diagnosis present

## 2019-02-26 DIAGNOSIS — I2510 Atherosclerotic heart disease of native coronary artery without angina pectoris: Secondary | ICD-10-CM | POA: Diagnosis present

## 2019-02-26 DIAGNOSIS — I330 Acute and subacute infective endocarditis: Secondary | ICD-10-CM | POA: Diagnosis present

## 2019-02-26 DIAGNOSIS — R9439 Abnormal result of other cardiovascular function study: Secondary | ICD-10-CM

## 2019-02-26 DIAGNOSIS — E785 Hyperlipidemia, unspecified: Secondary | ICD-10-CM | POA: Diagnosis present

## 2019-02-26 DIAGNOSIS — Z515 Encounter for palliative care: Secondary | ICD-10-CM | POA: Diagnosis present

## 2019-02-26 DIAGNOSIS — T826XXA Infection and inflammatory reaction due to cardiac valve prosthesis, initial encounter: Secondary | ICD-10-CM

## 2019-02-26 DIAGNOSIS — Z992 Dependence on renal dialysis: Secondary | ICD-10-CM

## 2019-02-26 DIAGNOSIS — R011 Cardiac murmur, unspecified: Secondary | ICD-10-CM | POA: Diagnosis not present

## 2019-02-26 DIAGNOSIS — K921 Melena: Secondary | ICD-10-CM | POA: Diagnosis present

## 2019-02-26 DIAGNOSIS — R109 Unspecified abdominal pain: Secondary | ICD-10-CM | POA: Diagnosis not present

## 2019-02-26 DIAGNOSIS — A419 Sepsis, unspecified organism: Secondary | ICD-10-CM | POA: Diagnosis not present

## 2019-02-26 DIAGNOSIS — K1379 Other lesions of oral mucosa: Secondary | ICD-10-CM | POA: Diagnosis not present

## 2019-02-26 DIAGNOSIS — K7290 Hepatic failure, unspecified without coma: Secondary | ICD-10-CM | POA: Diagnosis not present

## 2019-02-26 DIAGNOSIS — E872 Acidosis: Secondary | ICD-10-CM | POA: Diagnosis present

## 2019-02-26 DIAGNOSIS — R58 Hemorrhage, not elsewhere classified: Secondary | ICD-10-CM | POA: Diagnosis present

## 2019-02-26 DIAGNOSIS — R791 Abnormal coagulation profile: Secondary | ICD-10-CM

## 2019-02-26 DIAGNOSIS — N186 End stage renal disease: Secondary | ICD-10-CM | POA: Diagnosis present

## 2019-02-26 DIAGNOSIS — Y831 Surgical operation with implant of artificial internal device as the cause of abnormal reaction of the patient, or of later complication, without mention of misadventure at the time of the procedure: Secondary | ICD-10-CM | POA: Diagnosis present

## 2019-02-26 DIAGNOSIS — Z79899 Other long term (current) drug therapy: Secondary | ICD-10-CM

## 2019-02-26 DIAGNOSIS — E11649 Type 2 diabetes mellitus with hypoglycemia without coma: Secondary | ICD-10-CM | POA: Diagnosis present

## 2019-02-26 DIAGNOSIS — B9561 Methicillin susceptible Staphylococcus aureus infection as the cause of diseases classified elsewhere: Secondary | ICD-10-CM | POA: Diagnosis not present

## 2019-02-26 DIAGNOSIS — D509 Iron deficiency anemia, unspecified: Secondary | ICD-10-CM | POA: Diagnosis present

## 2019-02-26 DIAGNOSIS — I776 Arteritis, unspecified: Secondary | ICD-10-CM | POA: Diagnosis present

## 2019-02-26 DIAGNOSIS — I4819 Other persistent atrial fibrillation: Secondary | ICD-10-CM | POA: Diagnosis not present

## 2019-02-26 DIAGNOSIS — K7460 Unspecified cirrhosis of liver: Secondary | ICD-10-CM | POA: Diagnosis present

## 2019-02-26 DIAGNOSIS — R4182 Altered mental status, unspecified: Secondary | ICD-10-CM | POA: Diagnosis not present

## 2019-02-26 DIAGNOSIS — E162 Hypoglycemia, unspecified: Secondary | ICD-10-CM

## 2019-02-26 DIAGNOSIS — B955 Unspecified streptococcus as the cause of diseases classified elsewhere: Secondary | ICD-10-CM | POA: Diagnosis not present

## 2019-02-26 DIAGNOSIS — Z741 Need for assistance with personal care: Secondary | ICD-10-CM | POA: Diagnosis not present

## 2019-02-26 DIAGNOSIS — R9431 Abnormal electrocardiogram [ECG] [EKG]: Secondary | ICD-10-CM | POA: Diagnosis not present

## 2019-02-26 DIAGNOSIS — Z7401 Bed confinement status: Secondary | ICD-10-CM | POA: Diagnosis not present

## 2019-02-26 DIAGNOSIS — I120 Hypertensive chronic kidney disease with stage 5 chronic kidney disease or end stage renal disease: Secondary | ICD-10-CM | POA: Diagnosis not present

## 2019-02-26 DIAGNOSIS — I9589 Other hypotension: Secondary | ICD-10-CM | POA: Diagnosis present

## 2019-02-26 DIAGNOSIS — N2581 Secondary hyperparathyroidism of renal origin: Secondary | ICD-10-CM | POA: Diagnosis not present

## 2019-02-26 DIAGNOSIS — F1510 Other stimulant abuse, uncomplicated: Secondary | ICD-10-CM | POA: Diagnosis present

## 2019-02-26 DIAGNOSIS — R000 Tachycardia, unspecified: Secondary | ICD-10-CM | POA: Diagnosis not present

## 2019-02-26 DIAGNOSIS — S52602A Unspecified fracture of lower end of left ulna, initial encounter for closed fracture: Secondary | ICD-10-CM | POA: Diagnosis not present

## 2019-02-26 DIAGNOSIS — M109 Gout, unspecified: Secondary | ICD-10-CM | POA: Diagnosis present

## 2019-02-26 DIAGNOSIS — N200 Calculus of kidney: Secondary | ICD-10-CM | POA: Diagnosis present

## 2019-02-26 LAB — COMPREHENSIVE METABOLIC PANEL
ALT: 20 U/L (ref 0–44)
AST: 46 U/L — ABNORMAL HIGH (ref 15–41)
Albumin: 3.3 g/dL — ABNORMAL LOW (ref 3.5–5.0)
Alkaline Phosphatase: 146 U/L — ABNORMAL HIGH (ref 38–126)
Anion gap: 23 — ABNORMAL HIGH (ref 5–15)
BUN: 71 mg/dL — ABNORMAL HIGH (ref 6–20)
CO2: 19 mmol/L — ABNORMAL LOW (ref 22–32)
Calcium: 9.5 mg/dL (ref 8.9–10.3)
Chloride: 89 mmol/L — ABNORMAL LOW (ref 98–111)
Creatinine, Ser: 12.17 mg/dL — ABNORMAL HIGH (ref 0.61–1.24)
GFR calc Af Amer: 5 mL/min — ABNORMAL LOW (ref >60)
GFR calc non Af Amer: 4 mL/min — ABNORMAL LOW (ref >60)
Glucose, Bld: 67 mg/dL — ABNORMAL LOW (ref 70–99)
Potassium: 4.5 mmol/L (ref 3.5–5.1)
Sodium: 131 mmol/L — ABNORMAL LOW (ref 135–145)
Total Bilirubin: 1.5 mg/dL — ABNORMAL HIGH (ref 0.3–1.2)
Total Protein: 8 g/dL (ref 6.5–8.1)

## 2019-02-26 LAB — CBC WITH DIFFERENTIAL/PLATELET
Abs Immature Granulocytes: 0.06 10*3/uL (ref 0.00–0.07)
Basophils Absolute: 0 10*3/uL (ref 0.0–0.1)
Basophils Relative: 0 %
Eosinophils Absolute: 0 10*3/uL (ref 0.0–0.5)
Eosinophils Relative: 0 %
HCT: 28.5 % — ABNORMAL LOW (ref 39.0–52.0)
Hemoglobin: 9.7 g/dL — ABNORMAL LOW (ref 13.0–17.0)
Immature Granulocytes: 0 %
Lymphocytes Relative: 5 %
Lymphs Abs: 0.7 10*3/uL (ref 0.7–4.0)
MCH: 27.3 pg (ref 26.0–34.0)
MCHC: 34 g/dL (ref 30.0–36.0)
MCV: 80.3 fL (ref 80.0–100.0)
Monocytes Absolute: 0.8 10*3/uL (ref 0.1–1.0)
Monocytes Relative: 6 %
Neutro Abs: 12.1 10*3/uL — ABNORMAL HIGH (ref 1.7–7.7)
Neutrophils Relative %: 89 %
Platelets: 213 10*3/uL (ref 150–400)
RBC: 3.55 MIL/uL — ABNORMAL LOW (ref 4.22–5.81)
RDW: 15.9 % — ABNORMAL HIGH (ref 11.5–15.5)
WBC: 13.7 10*3/uL — ABNORMAL HIGH (ref 4.0–10.5)
nRBC: 0 % (ref 0.0–0.2)

## 2019-02-26 LAB — BASIC METABOLIC PANEL
Anion gap: 19 — ABNORMAL HIGH (ref 5–15)
BUN: 77 mg/dL — ABNORMAL HIGH (ref 6–20)
CO2: 20 mmol/L — ABNORMAL LOW (ref 22–32)
Calcium: 9.1 mg/dL (ref 8.9–10.3)
Chloride: 92 mmol/L — ABNORMAL LOW (ref 98–111)
Creatinine, Ser: 12.06 mg/dL — ABNORMAL HIGH (ref 0.61–1.24)
GFR calc Af Amer: 5 mL/min — ABNORMAL LOW (ref >60)
GFR calc non Af Amer: 4 mL/min — ABNORMAL LOW (ref >60)
Glucose, Bld: 70 mg/dL (ref 70–99)
Potassium: 4.8 mmol/L (ref 3.5–5.1)
Sodium: 131 mmol/L — ABNORMAL LOW (ref 135–145)

## 2019-02-26 LAB — TYPE AND SCREEN
ABO/RH(D): B POS
Antibody Screen: NEGATIVE

## 2019-02-26 LAB — CBG MONITORING, ED
Glucose-Capillary: 160 mg/dL — ABNORMAL HIGH (ref 70–99)
Glucose-Capillary: 45 mg/dL — ABNORMAL LOW (ref 70–99)
Glucose-Capillary: 69 mg/dL — ABNORMAL LOW (ref 70–99)
Glucose-Capillary: 62 mg/dL — ABNORMAL LOW (ref 70–99)

## 2019-02-26 LAB — MAGNESIUM: Magnesium: 2.2 mg/dL (ref 1.7–2.4)

## 2019-02-26 LAB — PROTIME-INR
INR: 4.1 (ref 0.8–1.2)
Prothrombin Time: 38.9 seconds — ABNORMAL HIGH (ref 11.4–15.2)

## 2019-02-26 LAB — TROPONIN I: Troponin I: 0.04 ng/mL (ref <0.03)

## 2019-02-26 MED ORDER — AMIODARONE HCL 200 MG PO TABS: 200.0000 mg | ORAL_TABLET | Freq: Two times a day (BID) | ORAL | Status: DC

## 2019-02-26 MED ORDER — ONDANSETRON HCL 4 MG/2ML IJ SOLN
4.0000 mg | Freq: Once | INTRAMUSCULAR | Status: AC
  Administered 2019-02-26: 4 mg via INTRAVENOUS
  Filled 2019-02-26: qty 2

## 2019-02-26 MED ORDER — BACLOFEN 5 MG HALF TABLET: 5.0000 mg | ORAL_TABLET | Freq: Three times a day (TID) | ORAL | Status: DC | PRN

## 2019-02-26 MED ORDER — SODIUM CHLORIDE 0.9 % IV SOLN: 100.0000 mL | INTRAVENOUS | Status: DC | PRN

## 2019-02-26 MED ORDER — HYDRALAZINE HCL 20 MG/ML IJ SOLN: 5.0000 mg | Freq: Four times a day (QID) | INTRAMUSCULAR | Status: DC | PRN

## 2019-02-26 MED ORDER — DEXTROSE 50 % IV SOLN
1.0000 | Freq: Once | INTRAVENOUS | Status: AC
  Administered 2019-02-26: 50 mL via INTRAVENOUS
  Filled 2019-02-26: qty 50

## 2019-02-26 MED ORDER — ONDANSETRON HCL 4 MG/2ML IJ SOLN
4.0000 mg | Freq: Four times a day (QID) | INTRAMUSCULAR | Status: DC | PRN
  Administered 2019-03-02 – 2019-03-07 (×3): 4 mg via INTRAVENOUS
  Filled 2019-02-26 (×3): qty 2

## 2019-02-26 MED ORDER — METOPROLOL TARTRATE 12.5 MG HALF TABLET
12.5000 mg | ORAL_TABLET | Freq: Two times a day (BID) | ORAL | Status: DC
  Administered 2019-02-27: 12.5 mg via ORAL
  Filled 2019-02-26: qty 1

## 2019-02-26 MED ORDER — SODIUM CHLORIDE 0.9 % IV SOLN: 250.0000 mL | INTRAVENOUS | Status: DC | PRN

## 2019-02-26 MED ORDER — SODIUM CHLORIDE 0.9% FLUSH
3.0000 mL | Freq: Two times a day (BID) | INTRAVENOUS | Status: DC
  Administered 2019-02-26 – 2019-03-07 (×16): 3 mL via INTRAVENOUS

## 2019-02-26 MED ORDER — SODIUM CHLORIDE 0.9% FLUSH: 3.0000 mL | INTRAVENOUS | Status: DC | PRN

## 2019-02-26 MED ORDER — LIDOCAINE VISCOUS HCL 2 % MT SOLN
15.0000 mL | Freq: Four times a day (QID) | OROMUCOSAL | Status: DC | PRN
  Administered 2019-03-07: 20:00:00 15 mL via OROMUCOSAL
  Filled 2019-02-26: qty 15

## 2019-02-26 MED ORDER — CHLORHEXIDINE GLUCONATE CLOTH 2 % EX PADS
6.0000 | MEDICATED_PAD | Freq: Every day | CUTANEOUS | Status: DC
  Administered 2019-02-27 – 2019-03-05 (×5): 6 via TOPICAL

## 2019-02-26 MED ORDER — ALTEPLASE 2 MG IJ SOLR: 2.0000 mg | Freq: Once | INTRAMUSCULAR | Status: DC | PRN

## 2019-02-26 MED ORDER — DEXTROSE 50 % IV SOLN
1.0000 | Freq: Once | INTRAVENOUS | Status: AC
  Administered 2019-02-26: 15:00:00 50 mL via INTRAVENOUS
  Filled 2019-02-26: qty 50

## 2019-02-26 MED ORDER — LIDOCAINE-PRILOCAINE 2.5-2.5 % EX CREA
1.0000 "application " | TOPICAL_CREAM | CUTANEOUS | Status: DC | PRN
  Filled 2019-02-26: qty 5

## 2019-02-26 MED ORDER — LIDOCAINE HCL (PF) 1 % IJ SOLN: 5.0000 mL | INTRAMUSCULAR | Status: DC | PRN

## 2019-02-26 MED ORDER — AMIODARONE LOAD VIA INFUSION
150.0000 mg | Freq: Once | INTRAVENOUS | Status: AC
  Administered 2019-02-26: 150 mg via INTRAVENOUS
  Filled 2019-02-26: qty 83.34

## 2019-02-26 MED ORDER — HYDROMORPHONE HCL 1 MG/ML IJ SOLN
0.5000 mg | Freq: Once | INTRAMUSCULAR | Status: AC
  Administered 2019-02-26: 16:00:00 0.5 mg via INTRAVENOUS
  Filled 2019-02-26: qty 1

## 2019-02-26 MED ORDER — FOLIC ACID 1 MG PO TABS
1.0000 mg | ORAL_TABLET | Freq: Every day | ORAL | Status: DC
  Administered 2019-02-27 – 2019-03-06 (×8): 1 mg via ORAL
  Filled 2019-02-26 (×8): qty 1

## 2019-02-26 MED ORDER — ONDANSETRON HCL 4 MG PO TABS: 4.0000 mg | ORAL_TABLET | Freq: Four times a day (QID) | ORAL | Status: DC | PRN

## 2019-02-26 MED ORDER — MIDODRINE HCL 5 MG PO TABS
10.0000 mg | ORAL_TABLET | ORAL | Status: DC | PRN
  Administered 2019-02-27 – 2019-03-03 (×3): 10 mg via ORAL
  Filled 2019-02-26 (×3): qty 2

## 2019-02-26 MED ORDER — AMIODARONE HCL IN DEXTROSE 360-4.14 MG/200ML-% IV SOLN
60.0000 mg/h | INTRAVENOUS | Status: DC
  Administered 2019-02-26: 60 mg/h via INTRAVENOUS
  Filled 2019-02-26: qty 200

## 2019-02-26 MED ORDER — DEXTROSE 50 % IV SOLN
1.0000 | Freq: Once | INTRAVENOUS | Status: AC
  Administered 2019-02-26: 17:00:00 50 mL via INTRAVENOUS
  Filled 2019-02-26: qty 50

## 2019-02-26 MED ORDER — MIDODRINE HCL 5 MG PO TABS: 5.0000 mg | ORAL_TABLET | Freq: Three times a day (TID) | ORAL | Status: DC

## 2019-02-26 MED ORDER — FENTANYL CITRATE (PF) 100 MCG/2ML IJ SOLN
50.0000 ug | Freq: Once | INTRAMUSCULAR | Status: AC
  Administered 2019-02-26: 13:00:00 50 ug via INTRAVENOUS
  Filled 2019-02-26: qty 2

## 2019-02-26 MED ORDER — DOXERCALCIFEROL 4 MCG/2ML IV SOLN
1.0000 ug | INTRAVENOUS | Status: DC
  Filled 2019-02-26 (×3): qty 2

## 2019-02-26 MED ORDER — LORATADINE 10 MG PO TABS
10.0000 mg | ORAL_TABLET | Freq: Every day | ORAL | Status: DC
  Administered 2019-02-27 – 2019-03-06 (×8): 10 mg via ORAL
  Filled 2019-02-26 (×8): qty 1

## 2019-02-26 MED ORDER — AMIODARONE HCL IN DEXTROSE 360-4.14 MG/200ML-% IV SOLN
30.0000 mg/h | INTRAVENOUS | Status: DC
  Filled 2019-02-26: qty 200

## 2019-02-26 MED ORDER — OXYCODONE HCL 5 MG PO TABS
5.0000 mg | ORAL_TABLET | ORAL | Status: DC | PRN
  Administered 2019-02-27 – 2019-03-05 (×17): 5 mg via ORAL
  Filled 2019-02-26 (×17): qty 1

## 2019-02-26 MED ORDER — FLUTICASONE PROPIONATE 50 MCG/ACT NA SUSP
2.0000 | Freq: Every day | NASAL | Status: DC
  Administered 2019-02-28 – 2019-03-07 (×8): 2 via NASAL
  Filled 2019-02-26: qty 16

## 2019-02-26 MED ORDER — CALCIUM ACETATE (PHOS BINDER) 667 MG PO CAPS
667.0000 mg | ORAL_CAPSULE | Freq: Three times a day (TID) | ORAL | Status: DC
  Administered 2019-02-27 – 2019-03-06 (×16): 667 mg via ORAL
  Filled 2019-02-26 (×16): qty 1

## 2019-02-26 MED ORDER — PENTAFLUOROPROP-TETRAFLUOROETH EX AERO
1.0000 "application " | INHALATION_SPRAY | CUTANEOUS | Status: DC | PRN
  Filled 2019-02-26: qty 116

## 2019-02-26 NOTE — H&P (Signed)
History and Physical    Michael [name] QVZ:[id number] DOB: 1961/04/24  DOA: 02/26/2019 PCP: Charlott Rakes, MD  Patient coming from: home  Chief Complaint: confusion   HPI: Michael [name] is a 58 y.o. 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  I was unable to obtain history from patient, attempted to call daughter x2 with no answers.  History per chart review.  Apparently patient missed dialysis multiple times this week he has had slow, bleeding for about a week and INR was checked and found to be elevated.  Apparently patient was yelling at his house and was found very confused and not making sense.  Level 5 caveat.   ED Course: While in the ED patient is yelling unintelligibly, moving and fidgeting his legs. When addressed directly he said yes to every question.  Lab work-up in the ED shows creatinine of 12.17, BUN 71, glucose 67, ALP 146, WBC 13.7, hemoglobin 9.7, INR 6.0, PT 38.9.  CT of the head with no acute findings.  Rectal temperature 200.2.  Review of Systems:   Unable to obtain, level 5 caveat  Past Medical History:  Diagnosis Date   Abdominal pain 02/25/2016   Acute encephalopathy 08/06/2018   Acute hypoxemic respiratory failure ([place]) 12/29/2015   Advance care planning    Anemia - mild exacerbation 10/15/2015   Anemia of chronic disease    Aortic regurgitation 02/27/2016   Asthma    said he does not know   Atrial fibrillation with RVR ([place]) 02/19/2016   Calf pain 09/10/2015   Chronic kidney disease (CKD), stage IV (severe) (HCC)    Chronic systolic CHF (congestive heart failure) ([place])    a. prior EF normal; b. Echo 10/16: Mild LVH, EF 30-35%, mitral valve bioprosthesis present without evidence  of stenosis or regurgitation, severe LAE, mild RVE with mild to moderately reduced RVSF, moderate RAE   Cirrhosis (HCC)    Congestive heart failure (CHF) ([place]) 02/25/2016   Coronary artery disease    Diabetes mellitus type 2 in obese ([place])    Diabetes mellitus without complication ([place])    Dilated cardiomyopathy ([place]) 12/24/2015   Dyspnea    Elevated troponin 11/24/2017   ESRD (end stage renal disease) on dialysis ([place])    Fluid overload 11/24/2017   GERD (gastroesophageal reflux disease)    Gout 10/23/2015   H/O tricuspid valve repair 2012   History of echocardiogram    Echo at [facility] in [place] 4/12: mod LVH, EF 60-65%, mod LAE, tissue MVR ok, mod TR, mod pulmo HTN with RVSP 48 mmHg   HLD (hyperlipidemia) 10/05/2016   Hyperkalemia 11/24/2017   Hypertension    Mitral valve disease    Obstructive sleep apnea    Pancreatitis, ruled out 02/25/2016   Paroxysmal atrial fibrillation ([place])    s/p Maze procedure at time of MVR in 2011   Permanent atrial fibrillation 02/25/2016   Pleural effusion    Polypharmacy 11/23/2015   Pulmonary HTN ([place])    S/P mitral valve replacement with porcine valve 2012   Septic shock ([place])    Syncope and collapse 08/10/2017   Thrombocytopenia ([place])    Viral gastroenteritis 02/27/2016    Past Surgical History:  Procedure Laterality Date   AV FISTULA PLACEMENT Left 07/13/2017   Procedure: LEFT ARM ARTERIOVENOUS (AV) FISTULA  CREATION;  Surgeon: Waynetta Sandy, MD;  Location: [place];  Service: Vascular;  Laterality: Left;   [place] Left 11/03/2017   Procedure: BASILIC VEIN TRANSPOSITION SECOND STAGE;  Surgeon: Waynetta Sandy, MD;  Location: [place];  Service: Vascular;  Laterality: Left;   [place] N/A 07/09/2017   Procedure: CARDIOVERSION;  Surgeon: Larey Dresser, MD;  Location: [facility] ENDOSCOPY;  Service: Cardiovascular;  Laterality: N/A;     COLONOSCOPY N/A 07/16/2017   Procedure: COLONOSCOPY;  Surgeon: Jerene Bears, MD;  Location: [place];  Service: Gastroenterology;  Laterality: N/A;   ESOPHAGOGASTRODUODENOSCOPY N/A 07/16/2017   Procedure: ESOPHAGOGASTRODUODENOSCOPY (EGD);  Surgeon: Jerene Bears, MD;  Location: [facility] ENDOSCOPY;  Service: Gastroenterology;  Laterality: N/A;   INSERTION OF DIALYSIS CATHETER Right 07/13/2017   Procedure: INSERTION OF DIALYSIS CATHETER RIGHT INTERNAL JUGULAR;  Surgeon: Waynetta Sandy, MD;  Location: [place];  Service: Vascular;  Laterality: Right;   RIGHT HEART CATH N/A 06/29/2017   Procedure: RIGHT HEART CATH;  Surgeon: Larey Dresser, MD;  Location: [place] CV LAB;  Service: Cardiovascular;  Laterality: N/A;   RIGHT/LEFT HEART CATH AND CORONARY ANGIOGRAPHY N/A 08/19/2017   Procedure: RIGHT/LEFT HEART CATH AND CORONARY ANGIOGRAPHY;  Surgeon: Leonie Man, MD;  Location: [place] CV LAB;  Service: Cardiovascular;  Laterality: N/A;   TEE WITHOUT CARDIOVERSION N/A 07/09/2017   Procedure: TRANSESOPHAGEAL ECHOCARDIOGRAM (TEE);  Surgeon: Larey Dresser, MD;  Location: [facility] ENDOSCOPY;  Service: Cardiovascular;  Laterality: N/A;     reports that he has quit smoking. His smoking use included cigarettes. He smoked 1.00 pack per day. He has never used smokeless tobacco. He reports that he does not drink alcohol or use drugs.  Allergies  Allergen Reactions   Triamcinolone Other (See Comments)    Felt like skin was burning    Family History  Problem Relation Age of Onset   Heart attack Neg Hx    Family history reviewed and nonpertinent.  Prior to Admission medications   Medication Sig Start Date End Date Taking? Authorizing Provider  amiodarone (PACERONE) 200 MG tablet TAKE 1 TABLET BY MOUTH TWICE A DAY Patient taking differently: Take 200 mg by mouth 2 (two) times daily.  07/14/18   Fay Records, MD  atorvastatin (LIPITOR) 20 MG tablet TAKE 1 TABLET (20 MG TOTAL) BY  MOUTH DAILY. PLEASE KEEP UPCOMING APPT. THANK YOU 01/14/19   Fay Records, MD  B Complex-C-Zn-Folic Acid (DIALYVITE/ZINC) TABS Take 1 tablet by mouth daily. 09/15/18   [provider]  baclofen (LIORESAL) 10 MG tablet Take 0.5 tablets (5 mg total) by mouth 3 (three) times daily as needed for muscle spasms. 12/21/18   Hilts, Legrand Como, MD  calcium acetate (PHOSLO) 667 MG capsule Take 1 capsule (667 mg total) by mouth 3 (three) times daily with meals. Please keep upcoming appt. Thank You 03/09/18   Fay Records, MD  cetirizine (ZYRTEC) 10 MG tablet Take 1 tablet (10 mg total) by mouth daily. 12/09/18   Argentina Donovan, PA-C  colchicine 0.6 MG tablet Take 0.5 tablets (0.3 mg total) by mouth 2 (two) times a week. 09/21/17   Tawny Asal, MD  diazepam (VALIUM) 5 MG tablet Take 5 mg by mouth at bedtime as needed (sleep).    [provider]  diphenhydramine-acetaminophen (TYLENOL PM) 25-500 MG TABS tablet Take 1 tablet by mouth at bedtime as needed (pain).  [provider]  DULoxetine (CYMBALTA) 60 MG capsule Take 1 capsule (60 mg total) by mouth daily. For back pain 01/06/19   Charlott Rakes, MD  fluticasone (FLONASE) 50 MCG/ACT nasal spray Place 2 sprays into both nostrils daily. 12/14/17   Bonnielee Haff, MD  folic acid (FOLVITE) 1 MG tablet TAKE 1 TABLET BY MOUTH EVERY DAY Patient taking differently: Take 1 mg by mouth daily.  11/22/18   Charlott Rakes, MD  gabapentin (NEURONTIN) 100 MG capsule Take 200 mg by mouth at bedtime. FOR LEG PAIN    [provider]  lidocaine (XYLOCAINE) 2 % solution Swish and spit 15 ml Q 6 hrs PRN 09/14/18   Ladell Pier, MD  metoprolol tartrate (LOPRESSOR) 25 MG tablet Take 1 tablet (25 mg total) by mouth 2 (two) times daily. Patient taking differently: Take 12.5 mg by mouth 2 (two) times daily.  07/22/18   Daune Perch, NP  midodrine (PROAMATINE) 10 MG tablet Take 5 mg by mouth 3 (three) times daily with meals.  12/21/18   [provider]  multivitamin (RENA-VIT) TABS tablet Take 1 tablet by mouth at bedtime. 12/14/17   Bonnielee Haff, MD  OXYGEN Inhale 2 L into the lungs daily as needed (shortness of breath).     [provider]  traZODone (DESYREL) 100 MG tablet Take 1 tablet (100 mg total) by mouth at bedtime. for sleep 01/06/19   Charlott Rakes, MD  warfarin (COUMADIN) 2.5 MG tablet TAKE AS DIRECTED BY COUMADIN CLINIC 02/10/19   Jerline Pain, MD    Physical Exam: Vitals:   02/26/19 1545 02/26/19 1700 02/26/19 1715 02/26/19 1730  BP:  97/82 (!) 127/98 (!) 124/101  Pulse:      Resp:  19  20  Temp:      TempSrc:      SpO2: 99%       Constitutional: Fidgeting, yelling Eyes: PERRL ENMT: Dried blood surrounding his lips and oropharynx. Neck: normal, supple, no masses, no thyromegaly Respiratory: clear to auscultation bilaterally, no wheezing, no crackles. Normal respiratory effort. No accessory muscle use.  Cardiovascular: Regular rate and rhythm, positive systolic murmur, no JVD Abdomen: Abdominal tenderness, guarding, positive bowel sounds, no organomegaly noted. Musculoskeletal:  No joint deformity upper and lower extremities. Good ROM, no contractures. Normal muscle tone.  Skin: Diffuse none blanch petechia throughout her entire body.  (History of vasculitis) Neurologic: Confused, not following commands speaking unintelligible. Psychiatric: Unable to evaluate.  Labs on Admission: I have personally reviewed following labs and imaging studies  CBC: Recent Labs  Lab 02/26/19 1253  WBC 13.7*  NEUTROABS 12.1*  HGB 9.7*  HCT 28.5*  MCV 80.3  PLT 413   Basic Metabolic Panel: Recent Labs  Lab 02/26/19 1253  NA 131*  K 4.5  CL 89*  CO2 19*  GLUCOSE 67*  BUN 71*  CREATININE 12.17*  CALCIUM 9.5   GFR: CrCl cannot be calculated (Unknown ideal weight.). Liver Function Tests: Recent Labs  Lab 02/26/19 1253  AST 46*  ALT 20  ALKPHOS 146*  BILITOT 1.5*  PROT 8.0  ALBUMIN  3.3*   No results for input(s): LIPASE, AMYLASE in the last 168 hours. No results for input(s): AMMONIA in the last 168 hours. Coagulation Profile: Recent Labs  Lab 02/21/19 02/26/19 1253  INR 6.0* 4.1*   Cardiac Enzymes: No results for input(s): CKTOTAL, CKMB, CKMBINDEX, TROPONINI in the last 168 hours. BNP (last 3 results) No results for input(s): PROBNP in the last 8760 hours.  HbA1C: No results for input(s): HGBA1C in the last 72 hours. CBG: Recent Labs  Lab 02/26/19 1446 02/26/19 1506 02/26/19 1619  GLUCAP 45* 160* 69*   Lipid Profile: No results for input(s): CHOL, HDL, LDLCALC, TRIG, CHOLHDL, LDLDIRECT in the last 72 hours. Thyroid Function Tests: No results for input(s): TSH, T4TOTAL, FREET4, T3FREE, THYROIDAB in the last 72 hours. Anemia Panel: No results for input(s): VITAMINB12, FOLATE, FERRITIN, TIBC, IRON, RETICCTPCT in the last 72 hours. Urine analysis:    Component Value Date/Time   COLORURINE AMBER (A) 08/11/2018 1943   APPEARANCEUR CLOUDY (A) 08/11/2018 1943   LABSPEC 1.021 08/11/2018 1943   PHURINE 5.0 08/11/2018 1943   GLUCOSEU NEGATIVE 08/11/2018 1943   HGBUR LARGE (A) 08/11/2018 [place] NEGATIVE 08/11/2018 1943   KETONESUR 5 (A) 08/11/2018 1943   PROTEINUR 100 (A) 08/11/2018 1943   NITRITE NEGATIVE 08/11/2018 1943   LEUKOCYTESUR MODERATE (A) 08/11/2018 1943   Sepsis Labs: !!!!!!!!!!!!!!!!!!!!!!!!!!!!!!!!!!!!!!!!!!!! @LABRCNTIP (procalcitonin:4,lacticidven:4) )No results found for this or any previous visit (from the past 240 hour(s)).   Radiological Exams on Admission: Dg Wrist 2 Views Left  Result Date: 02/26/2019 CLINICAL DATA:  58 year old who fell and injured the LEFT wrist approximately 1 month ago. Persistent pain. Subsequent encounter. EXAM: LEFT WRIST - 2 VIEW COMPARISON:  02/03/2019. FINDINGS: Subacute comminuted and impacted intra-articular fracture involving the distal radius into multiple fragments, unchanged from the  02/03/2019 examination with the exception of perhaps slightly more impaction currently. Stable comminuted fracture involving the distal ulna, including the ulnar styloid. No new interval fractures. Radiocarpal joint space narrowing. Possible VISI deformity of the wrist. IMPRESSION: 1. Subacute comminuted and impacted intra-articular fracture involving the distal radius into multiple fragments, unchanged since the 02/03/19 examination with the exception of perhaps slightly more impaction currently. 2. Stable comminuted fracture involving the distal ulna, including the ulnar styloid. 3. No new interval fractures. 4. Possible VISI deformity of the wrist. Electronically Signed   By: Evangeline Dakin M.D.   On: 02/26/2019 13:45   Ct Head Wo Contrast  Result Date: 02/26/2019 CLINICAL DATA:  Altered mental status. EXAM: CT HEAD WITHOUT CONTRAST TECHNIQUE: Contiguous axial images were obtained from the base of the skull through the vertex without intravenous contrast. COMPARISON:  CT scan of February 03, 2019. FINDINGS: Brain: No evidence of acute infarction, hemorrhage, hydrocephalus, extra-axial collection or mass lesion/mass effect. Vascular: No hyperdense vessel or unexpected calcification. Skull: Normal. Negative for fracture or focal lesion. Sinuses/Orbits: No acute finding. Other: None. IMPRESSION: No acute intracranial abnormality seen. Electronically Signed   By: Marijo Conception, M.D.   On: 02/26/2019 16:38   Dg Chest Port 1 View  Result Date: 02/26/2019 CLINICAL DATA:  Tachycardia EXAM: PORTABLE CHEST 1 VIEW COMPARISON:  02/03/2019 FINDINGS: Moderate cardiomegaly. Vascular congestion. Stable right pleural effusion and right basilar opacities. Minimal subsegmental atelectasis at the left base. Upper lungs clear. No pneumothorax. IMPRESSION: Stable vascular congestion. Stable right pleural effusion and basilar pulmonary opacities. Electronically Signed   By: Marybelle Killings M.D.   On: 02/26/2019 13:24   EKG:  Independently reviewed.   Assessment/Plan Acute metabolic encephalopathy Unclear etiology at this time, patient missed multiple dialysis, ?  Uremia vs infectious process vs medication induced, patient on multiple sedating without dialysis.  CT of the head negative.  Will admit to [place], nephrology has been consulted and will dialyze patient today.  Will attempt to collect urine culture, unknown if patient produce urine.  Will hold on antibiotics for now, if he spikes  fever consider to add empiric antibiotics.  Hold all sedating medications. Check Utox.  Abdominal pain Unclear etiology, patient guarding his left lower quadrant.  Will obtain CT scan to rule out intra-abdominal bleed as INR was significantly elevated.  Elevated INR Patient was bleeding through his oral cavity however this has stopped at this point.  Hold Coumadin, continue to monitor PTT/INR.  CBC twice daily.  PAF Stable, status post maze, currently on sinus rhythm.  Continue amiodarone.  Holding anticoagulation.  Hypertension with chronic hypotension Blood pressure elevated, will hold [place], continue metoprolol.  Monitor BP.  Will add hydralazine as needed for BP > 170/100  Diabetes mellitus type 2, now with hypoglycemia. Patient is diet controlled at home, will continue to monitor will not add sliding scale at this time.  History of leukocytoclastic vasculitis Diffuse petechial rash noted throughout the body.  Monitor for now.  DVT prophylaxis: SCDs, due to elevated INR Code Status: Full code Family Communication: None at bedside, attempted to call daughter, no answer x2.  Disposition Plan: Anticipate discharge to previous home environment.  Consults called: Nephrology. Admission status: Inpatient/MedSurg.  Chipper Oman MD [facility] Pager: Text Page via [email or web address]   If 7PM-7AM, please contact night-coverage [email or web address]  02/26/2019, 6:36 PM

## 2019-02-26 NOTE — ED Notes (Signed)
Patient transported to CT 

## 2019-02-26 NOTE — ED Provider Notes (Signed)
1615: hand off from previous EDPA. Limited exam due to language barrier.  Pt has h/o meth abuse, ESRD MWFSa HD, PAF, s/p Maze, MVR s/p valve replacement on coumadin, DM, HTN, chronic hypotension, cirrhosis, vasculitis.  Per EMS report daughter picked him up for dialysis and found patient confused, hollering not following commands.  Has missed dialysis multiple times this week.  Pt has had slow bleeding from gum x 1 week, on coumadin for atrial fibrillation INR 4.1.  Broken left wrist 3 weeks ago which was examined by previous EDPA splinted was removed, x-ray stable, re-splinted has f/u with ortho in 1 week.  Continues to holler in the ED and not speaking to the interpreter.  Level 5 caveat. Daughter contacted by previous EDPA daughter [phone].   Physical Exam  BP 97/82   Pulse 79   Temp 98 F (36.7 C) (Oral)   Resp 19   SpO2 (!) 88%   Physical Exam Constitutional:      Appearance: He is well-developed.     Comments: Moaning and saying incomprehensible words. Keeps eyes closed.   HENT:     Head: Normocephalic.     Right Ear: External ear normal.     Left Ear: External ear normal.     Nose: Nose normal.     Mouth/Throat:     Comments: Poor dentition throughout but no obvious instability to dentition or loose teeth, unable to see source of bleeding but bleeding appears to have stopped. Dried up blood on lips and on tongue. Facial bones stable.  Eyes:     Conjunctiva/sclera: Conjunctivae normal.     Comments: PERRL bilaterally  Neck:     Musculoskeletal: Full passive range of motion without pain.     Comments: Neck is supple, full passive flexion without obvious pain noted. Moving head/neck spontaneously without difficulty during exam.  Cardiovascular:     Rate and Rhythm: Tachycardia present.  Pulmonary:     Effort: Pulmonary effort is normal. No tachypnea or respiratory distress.  Abdominal:     General: Abdomen is flat.     Palpations: Abdomen is soft.  Genitourinary:  Comments: Rectal temp 100.2. No skin break down to sacral area Musculoskeletal: Normal range of motion.  Skin:    General: Skin is warm and dry.     Capillary Refill: Capillary refill takes less than 2 seconds.     Comments: Diffuse non blanchable rash to entire body (h/o vasculitis)   Neurological:     Mental Status: He is oriented to person, place, and time.  Psychiatric:        Behavior: Behavior normal.        Thought Content: Thought content normal.     ED Course/Procedures   Clinical Course as of Feb 25 1726  Sat Feb 26, 2019  1411 Inr Improved but still supratherapeutic  INR(!!): 4.1 [AH]  1411 Hgb is stable  Hemoglobin(!): 9.7 [AH]  1628 Glucose-Capillary(!): 69 [AH]  1727 CO2(!): 19 [CG]  1727 Glucose(!): 67 [CG]  1727 Creatinine(!): 12.17 [CG]  1727 BUN(!): 71 [CG]  1727 Anion gap(!): 23 [CG]  1727 WBC(!): 13.7 [CG]  1727 Hemoglobin(!): 9.7 [CG]    Clinical Course User Index [AH] Margarita Mail, PA-C [CG] Kinnie Feil, PA-C    Procedures  MDM   1726: Spoke to Dr Quincy Simmonds who will admit patient for encephalopathy likely from uremia given missed dialysis. H/o meth abuse could be contributing. Labs and imaging reviewed and as above.  Hypoglycemia received 2 amps of  dextrose.  Elevated creatinine, BUN, anion gap, WBC. Rectal temp 100.2 but negative CXR, no signs of skin break down, wounds.  Unsure if pt produced urine.  Unlikely to be meningitis. Nephrology has been consulted and will dialyze patient.    Kinnie Feil, PA-C 02/26/19 1731    Veryl Speak, MD 02/26/19 2209

## 2019-02-26 NOTE — ED Notes (Addendum)
Spoke patient's daughter, Santa Lighter reports that she found her father this morning in his house screaming. She reports that she was called by outpatient dialysis telling her that PT missed 2 dialysis sessions and so she went to his home to find him. Patient receives dialysis 4 X wk  She also states that he has had a "slow bleed" in his mouth for the last one week, stating that when she was helping him to clean him mouth, she observed bleeding from his "top left side and lower right side teeth."  In reference to skin, Pattie states that her father has been seeing a dermatologist

## 2019-02-26 NOTE — ED Notes (Signed)
Attempted to call report for the second time. Was told that the nurse is still in the room with a patient. Will call one more time in a few minutes.

## 2019-02-26 NOTE — Consult Note (Addendum)
[facility] Renal Consultation Note    Indication for Consultation:  Management of ESRD/hemodialysis; anemia, hypertension/volume and secondary hyperparathyroidism PCP: Collene Mares, MD  HPI: Michael [name] is a 58 y.o. [place]ian  male on [place] HD at [facility] who last dialyzed Monday. His PMHx is signficant for PAF, s/p Maze, MVR with porcine valve on coumadin, DM, HTN, chronic hypotension, CAD, cirrhosis, leukocytoclastic vasculitis, recent wrist fracture, meth abuse He was called by the dialysis staff Friday after having missed Wed and Friday dialysis and they were unable to reach him.  His last HD was 4/6.  Even though he didn't got to dialysis Wed, he did have and INR checked which was 6 They eventually called his daughter who came over but was unable to get into the house.  The patient was heard to be yelling in pain inside c/o about his wrist.  INR 4/6 was 5.98. EMS broke into house and brought patient to the ED  Repeat INR this week was  With INR down to 4.1 today and stable hgb 9.7.  Currently he is yelling intelligibly limiting ROS.  ED provider spoke with daughter. Patient apparently llives alone.  Even though there is a language barrier, at baseline, you can have basic communication.    Evaluation in the ED K 4.5 BUN 71 Cr 12.17 NA 131 CO2 19 glu 67 WBC 13.7 90% N hgb 9.7 plts 213. Repeat wrist xray showed subacute comminuted and impated intra-articular fx  Of the distal radiation - possibly more impacted, neg Head CT  CXR showed stable vascular congestion and basilar pul opacities.    Past Medical History:  Diagnosis Date  . Abdominal pain 02/25/2016  . Acute encephalopathy 08/06/2018  . Acute hypoxemic respiratory failure ([place]) 12/29/2015  . Advance care planning   . Anemia - mild exacerbation 10/15/2015  . Anemia of chronic disease   . Aortic regurgitation 02/27/2016  . Asthma    said he does not know  . Atrial fibrillation with RVR ([place]) 02/19/2016  . Calf pain 09/10/2015   . Chronic kidney disease (CKD), stage IV (severe) ([place])   . Chronic systolic CHF (congestive heart failure) (HCC)    a. prior EF normal; b. Echo 10/16: Mild LVH, EF 30-35%, mitral valve bioprosthesis present without evidence of stenosis or regurgitation, severe LAE, mild RVE with mild to moderately reduced RVSF, moderate RAE  . Cirrhosis ([place])   . Congestive heart failure (CHF) ([place]) 02/25/2016  . Coronary artery disease   . Diabetes mellitus type 2 in obese ([place])   . Diabetes mellitus without complication ([place])   . Dilated cardiomyopathy ([place]) 12/24/2015  . Dyspnea   . Elevated troponin 11/24/2017  . ESRD (end stage renal disease) on dialysis ([place])   . Fluid overload 11/24/2017  . GERD (gastroesophageal reflux disease)   . Gout 10/23/2015  . H/O tricuspid valve repair 2012  . History of echocardiogram    Echo at [facility] in [facility] 4/12: mod LVH, EF 60-65%, mod LAE, tissue MVR ok, mod TR, mod pulmo HTN with RVSP 48 mmHg  . HLD (hyperlipidemia) 10/05/2016  . Hyperkalemia 11/24/2017  . Hypertension   . Mitral valve disease   . Obstructive sleep apnea   . Pancreatitis, ruled out 02/25/2016  . Paroxysmal atrial fibrillation (HCC)    s/p Maze procedure at time of MVR in 2011  . Permanent atrial fibrillation 02/25/2016  . Pleural effusion   . Polypharmacy 11/23/2015  . Pulmonary HTN ([place])   . S/P mitral valve replacement  with porcine valve 2012  . Septic shock ([place])   . Syncope and collapse 08/10/2017  . Thrombocytopenia ([place])   . Viral gastroenteritis 02/27/2016   Past Surgical History:  Procedure Laterality Date  . AV FISTULA PLACEMENT Left 07/13/2017   Procedure: LEFT ARM ARTERIOVENOUS (AV) FISTULA CREATION;  Surgeon: Waynetta Sandy, MD;  Location: [place];  Service: Vascular;  Laterality: Left;  . BASCILIC VEIN TRANSPOSITION Left 11/03/2017   Procedure: BASILIC VEIN TRANSPOSITION SECOND STAGE;  Surgeon: Waynetta Sandy, MD;  Location: [place];  Service: Vascular;  Laterality:  Left;  . CARDIAC SURGERY    . CARDIAC VALVE REPLACEMENT  2011  . CARDIOVERSION N/A 07/09/2017   Procedure: CARDIOVERSION;  Surgeon: Larey Dresser, MD;  Location: [facility] ENDOSCOPY;  Service: Cardiovascular;  Laterality: N/A;  . COLONOSCOPY N/A 07/16/2017   Procedure: COLONOSCOPY;  Surgeon: Jerene Bears, MD;  Location: [place];  Service: Gastroenterology;  Laterality: N/A;  . ESOPHAGOGASTRODUODENOSCOPY N/A 07/16/2017   Procedure: ESOPHAGOGASTRODUODENOSCOPY (EGD);  Surgeon: Jerene Bears, MD;  Location: [facility] ENDOSCOPY;  Service: Gastroenterology;  Laterality: N/A;  . INSERTION OF DIALYSIS CATHETER Right 07/13/2017   Procedure: INSERTION OF DIALYSIS CATHETER RIGHT INTERNAL JUGULAR;  Surgeon: Waynetta Sandy, MD;  Location: [place];  Service: Vascular;  Laterality: Right;  . RIGHT HEART CATH N/A 06/29/2017   Procedure: RIGHT HEART CATH;  Surgeon: Larey Dresser, MD;  Location: [place] CV LAB;  Service: Cardiovascular;  Laterality: N/A;  . RIGHT/LEFT HEART CATH AND CORONARY ANGIOGRAPHY N/A 08/19/2017   Procedure: RIGHT/LEFT HEART CATH AND CORONARY ANGIOGRAPHY;  Surgeon: Leonie Man, MD;  Location: [place] CV LAB;  Service: Cardiovascular;  Laterality: N/A;  . TEE WITHOUT CARDIOVERSION N/A 07/09/2017   Procedure: TRANSESOPHAGEAL ECHOCARDIOGRAM (TEE);  Surgeon: Larey Dresser, MD;  Location: [facility] ENDOSCOPY;  Service: Cardiovascular;  Laterality: N/A;   Family History  Problem Relation Age of Onset  . Heart attack Neg Hx    Social History:  reports that he has quit smoking. His smoking use included cigarettes. He smoked 1.00 pack per day. He has never used smokeless tobacco. He reports that he does not drink alcohol or use drugs. Allergies  Allergen Reactions  . Triamcinolone Other (See Comments)    Felt like skin was burning   Prior to Admission medications   Medication Sig Start Date End Date Taking? Authorizing Provider  amiodarone (PACERONE) 200 MG tablet TAKE 1 TABLET BY  MOUTH TWICE A DAY Patient taking differently: Take 200 mg by mouth 2 (two) times daily.  07/14/18   Fay Records, MD  atorvastatin (LIPITOR) 20 MG tablet TAKE 1 TABLET (20 MG TOTAL) BY MOUTH DAILY. PLEASE KEEP UPCOMING APPT. THANK YOU 01/14/19   Fay Records, MD  B Complex-C-Zn-Folic Acid (DIALYVITE/ZINC) TABS Take 1 tablet by mouth daily. 09/15/18   [provider]  baclofen (LIORESAL) 10 MG tablet Take 0.5 tablets (5 mg total) by mouth 3 (three) times daily as needed for muscle spasms. 12/21/18   Hilts, Legrand Como, MD  calcium acetate (PHOSLO) 667 MG capsule Take 1 capsule (667 mg total) by mouth 3 (three) times daily with meals. Please keep upcoming appt. Thank You 03/09/18   Fay Records, MD  cetirizine (ZYRTEC) 10 MG tablet Take 1 tablet (10 mg total) by mouth daily. 12/09/18   Argentina Donovan, PA-C  colchicine 0.6 MG tablet Take 0.5 tablets (0.3 mg total) by mouth 2 (two) times a week. 09/21/17   Tawny Asal, MD  diazepam (VALIUM) 5 MG tablet Take 5 mg by mouth at bedtime as needed (sleep).    [provider]  diphenhydramine-acetaminophen (TYLENOL PM) 25-500 MG TABS tablet Take 1 tablet by mouth at bedtime as needed (pain).    [provider]  DULoxetine (CYMBALTA) 60 MG capsule Take 1 capsule (60 mg total) by mouth daily. For back pain 01/06/19   Charlott Rakes, MD  fluticasone (FLONASE) 50 MCG/ACT nasal spray Place 2 sprays into both nostrils daily. 12/14/17   Bonnielee Haff, MD  folic acid (FOLVITE) 1 MG tablet TAKE 1 TABLET BY MOUTH EVERY DAY Patient taking differently: Take 1 mg by mouth daily.  11/22/18   Charlott Rakes, MD  gabapentin (NEURONTIN) 100 MG capsule Take 200 mg by mouth at bedtime. FOR LEG PAIN    [provider]  lidocaine (XYLOCAINE) 2 % solution Swish and spit 15 ml Q 6 hrs PRN 09/14/18   Ladell Pier, MD  metoprolol tartrate (LOPRESSOR) 25 MG tablet Take 1 tablet (25 mg total) by mouth 2 (two) times daily. Patient taking  differently: Take 12.5 mg by mouth 2 (two) times daily.  07/22/18   Daune Perch, NP  midodrine (PROAMATINE) 10 MG tablet Take 5 mg by mouth 3 (three) times daily with meals.  12/21/18   [provider]  multivitamin (RENA-VIT) TABS tablet Take 1 tablet by mouth at bedtime. 12/14/17   Bonnielee Haff, MD  OXYGEN Inhale 2 L into the lungs daily as needed (shortness of breath).     [provider]  traZODone (DESYREL) 100 MG tablet Take 1 tablet (100 mg total) by mouth at bedtime. for sleep 01/06/19   Charlott Rakes, MD  warfarin (COUMADIN) 2.5 MG tablet TAKE AS DIRECTED BY COUMADIN CLINIC 02/10/19   Jerline Pain, MD   Current Facility-Administered Medications  Medication Dose Route Frequency Provider Last Rate Last Dose  . 0.9 %  sodium chloride infusion  250 mL Intravenous PRN Sherwood Gambler, MD      . Derrill Memo ON 02/27/2019] Chlorhexidine Gluconate Cloth 2 % PADS 6 each  6 each Topical Q0600 Alric Seton, PA-C      . [START ON 02/28/2019] doxercalciferol (HECTOROL) injection 1 mcg  1 mcg Intravenous Q M,W,F-HD Alric Seton, PA-C      . sodium chloride flush (NS) 0.9 % injection 3 mL  3 mL Intravenous Q12H Sherwood Gambler, MD   3 mL at 02/26/19 1453  . sodium chloride flush (NS) 0.9 % injection 3 mL  3 mL Intravenous PRN Sherwood Gambler, MD       Current Outpatient Medications  Medication Sig Dispense Refill  . amiodarone (PACERONE) 200 MG tablet TAKE 1 TABLET BY MOUTH TWICE A DAY (Patient taking differently: Take 200 mg by mouth 2 (two) times daily. ) 60 tablet 10  . atorvastatin (LIPITOR) 20 MG tablet TAKE 1 TABLET (20 MG TOTAL) BY MOUTH DAILY. PLEASE KEEP UPCOMING APPT. THANK YOU 90 tablet 3  . B Complex-C-Zn-Folic Acid (DIALYVITE/ZINC) TABS Take 1 tablet by mouth daily.    . baclofen (LIORESAL) 10 MG tablet Take 0.5 tablets (5 mg total) by mouth 3 (three) times daily as needed for muscle spasms. 30 each 3  . calcium acetate (PHOSLO) 667 MG capsule Take 1 capsule (667 mg  total) by mouth 3 (three) times daily with meals. Please keep upcoming appt. Thank You 90 capsule 0  . cetirizine (ZYRTEC) 10 MG tablet Take 1 tablet (10 mg total) by mouth daily. 30 tablet 11  .  colchicine 0.6 MG tablet Take 0.5 tablets (0.3 mg total) by mouth 2 (two) times a week. 30 tablet 0  . diazepam (VALIUM) 5 MG tablet Take 5 mg by mouth at bedtime as needed (sleep).    . diphenhydramine-acetaminophen (TYLENOL PM) 25-500 MG TABS tablet Take 1 tablet by mouth at bedtime as needed (pain).    . DULoxetine (CYMBALTA) 60 MG capsule Take 1 capsule (60 mg total) by mouth daily. For back pain 30 capsule 3  . fluticasone (FLONASE) 50 MCG/ACT nasal spray Place 2 sprays into both nostrils daily. 16 g 0  . folic acid (FOLVITE) 1 MG tablet TAKE 1 TABLET BY MOUTH EVERY DAY (Patient taking differently: Take 1 mg by mouth daily. ) 90 tablet 0  . gabapentin (NEURONTIN) 100 MG capsule Take 200 mg by mouth at bedtime. FOR LEG PAIN    . lidocaine (XYLOCAINE) 2 % solution Swish and spit 15 ml Q 6 hrs PRN 200 mL 0  . metoprolol tartrate (LOPRESSOR) 25 MG tablet Take 1 tablet (25 mg total) by mouth 2 (two) times daily. (Patient taking differently: Take 12.5 mg by mouth 2 (two) times daily. ) 180 tablet 3  . midodrine (PROAMATINE) 10 MG tablet Take 5 mg by mouth 3 (three) times daily with meals.     . multivitamin (RENA-VIT) TABS tablet Take 1 tablet by mouth at bedtime. 30 tablet 0  . OXYGEN Inhale 2 L into the lungs daily as needed (shortness of breath).     . traZODone (DESYREL) 100 MG tablet Take 1 tablet (100 mg total) by mouth at bedtime. for sleep 30 tablet 3  . warfarin (COUMADIN) 2.5 MG tablet TAKE AS DIRECTED BY COUMADIN CLINIC 35 tablet 0   Labs: Basic Metabolic Panel: Recent Labs  Lab 02/26/19 1253  NA 131*  K 4.5  CL 89*  CO2 19*  GLUCOSE 67*  BUN 71*  CREATININE 12.17*  CALCIUM 9.5   Liver Function Tests: Recent Labs  Lab 02/26/19 1253  AST 46*  ALT 20  ALKPHOS 146*  BILITOT 1.5*   PROT 8.0  ALBUMIN 3.3*   No results for input(s): LIPASE, AMYLASE in the last 168 hours. No results for input(s): AMMONIA in the last 168 hours. CBC: Recent Labs  Lab 02/26/19 1253  WBC 13.7*  NEUTROABS 12.1*  HGB 9.7*  HCT 28.5*  MCV 80.3  PLT 213   Cardiac Enzymes: No results for input(s): CKTOTAL, CKMB, CKMBINDEX, TROPONINI in the last 168 hours. CBG: Recent Labs  Lab 02/26/19 1446 02/26/19 1506 02/26/19 1619  GLUCAP 45* 160* 69*   Iron Studies: No results for input(s): IRON, TIBC, TRANSFERRIN, FERRITIN in the last 72 hours. Studies/Results: Dg Wrist 2 Views Left  Result Date: 02/26/2019 CLINICAL DATA:  58 year old who fell and injured the LEFT wrist approximately 1 month ago. Persistent pain. Subsequent encounter. EXAM: LEFT WRIST - 2 VIEW COMPARISON:  02/03/2019. FINDINGS: Subacute comminuted and impacted intra-articular fracture involving the distal radius into multiple fragments, unchanged from the 02/03/2019 examination with the exception of perhaps slightly more impaction currently. Stable comminuted fracture involving the distal ulna, including the ulnar styloid. No new interval fractures. Radiocarpal joint space narrowing. Possible VISI deformity of the wrist. IMPRESSION: 1. Subacute comminuted and impacted intra-articular fracture involving the distal radius into multiple fragments, unchanged since the 02/03/19 examination with the exception of perhaps slightly more impaction currently. 2. Stable comminuted fracture involving the distal ulna, including the ulnar styloid. 3. No new interval fractures. 4. Possible VISI deformity  of the wrist. Electronically Signed   By: Evangeline Dakin M.D.   On: 02/26/2019 13:45   Ct Head Wo Contrast  Result Date: 02/26/2019 CLINICAL DATA:  Altered mental status. EXAM: CT HEAD WITHOUT CONTRAST TECHNIQUE: Contiguous axial images were obtained from the base of the skull through the vertex without intravenous contrast. COMPARISON:  CT  scan of February 03, 2019. FINDINGS: Brain: No evidence of acute infarction, hemorrhage, hydrocephalus, extra-axial collection or mass lesion/mass effect. Vascular: No hyperdense vessel or unexpected calcification. Skull: Normal. Negative for fracture or focal lesion. Sinuses/Orbits: No acute finding. Other: None. IMPRESSION: No acute intracranial abnormality seen. Electronically Signed   By: Marijo Conception, M.D.   On: 02/26/2019 16:38   Dg Chest Port 1 View  Result Date: 02/26/2019 CLINICAL DATA:  Tachycardia EXAM: PORTABLE CHEST 1 VIEW COMPARISON:  02/03/2019 FINDINGS: Moderate cardiomegaly. Vascular congestion. Stable right pleural effusion and right basilar opacities. Minimal subsegmental atelectasis at the left base. Upper lungs clear. No pneumothorax. IMPRESSION: Stable vascular congestion. Stable right pleural effusion and basilar pulmonary opacities. Electronically Signed   By: Marybelle Killings M.D.   On: 02/26/2019 13:24    ROS: As per HPI otherwise negative.  Physical Exam: Vitals:   02/26/19 1230 02/26/19 1445  BP: (!) 147/77 120/69  Pulse: 69 79  Resp: (!) 25 19  Temp: 98 F (36.7 C)   TempSrc: Oral   SpO2: 99% (!) 88%     General: slender ill appearing male Head: NCAT sclera not icteric MMM, eyes tightly wearing mask Neck: Supple.  Lungs:  Dim BS no obvious rales  But hard to appreciate due to yelling  Breathing is unlabored. Heart: RRR with S1 S2.  Abdomen: soft NT + BS Skin:  Diffuse vasculitic rash on extremities upper and lower and also on torso Extremities LLE not edema left wrist wrapped with ACE Neuro: A & O  X 3. Moves all extremities spontaneously. Rocking in bed, yelling unintellibly - Psych:  agitated Dialysis Access: left upper AVF + bruit  Dialysis Orders:  MWFS 4 hr [place] 3K 2.25 Ca no heparin EDW 63 but leaving below the past two treatments - last HD 4/6 Var Na no profile 300/800 hectorol 1 mircera 30 q 2 weeks - last 4/6 Recent labs: h hgb 11.1 4/1 INR 5.98 4/6  41% sat Ca 8.8 P 1.8 ipTH 175 alb 3.6   Assessment/Plan: 1.  AMS - ? Multifactorial - mild leukocytosis, afebrile, ? infilatrate, hx meth use, polypharmcy -- also has baclofen on med list - which is contraindicated for ESRD patients 2.  ESRD -  MWFS- last HD 4/6 - leaving below edw- had previously been on 4 x a week HD due to large IDWG - not an issue of late metabolic acidosis should correct with HD 3.  Hypertension/volume  - volume on CXR - not much on exam - appears to be losing weight 4.  Anemia  - hgb 9.7 down from 11.1 - last tsat ok - watch trend - not due for redose until 4/15 - if drops further resume ESA next week 5.  Metabolic bone disease -  Resume hectorol Monday - last P low - no binders for now 6.  Nutrition - renal diet  7. DM - per primary 8. Leukocytoclastic vasculitis - seems stable - has had for some time now - no heparin HD 9. Substance abuse - hx of meth use - drug screen pending 10. Left wrist fx last month -? Bleeding into fx  area causing increased pain from swelling? 11. Supratherapeutic INR - since 4/6 - likely contributory to mouth bleeding  12. PAF/MVR - on chronic coumadin - per primary 13. Cirrhosis - T Bili up slightly 1.5   Myriam Jacobson, PA-C [place] [phone] 02/26/2019, 4:44 PM

## 2019-02-26 NOTE — Consult Note (Addendum)
..   NAMEKaelen [name], MRN:  [id number], DOB:  06-07-1961, LOS: 0 ADMISSION DATE:  02/26/2019, CONSULTATION DATE:  02/26/2019 REFERRING MD:  OPYD , CHIEF COMPLAINT:  Oral bleeding    Brief History   58 yr old M w/ PMHx Afib(s/p MAZE and MVR in 2011), CHF, Cirrhosis, Pulm HTN, ESRD missed his last 2 HD sessions ( last HD was 4/6) Found confused with a new rash and oral bleeding by his daughter. Admitted to Hospitalist Renal consulted for HD. Prior to patient moving to floor he went into NSVT-> sustained VT now on Amio and back in sinus. PCCM consulted for evaluation.  History of present illness   There is both a language barrier and altered mental status- History is obtained from the accounts of other providers, EMR and patient's daughter  25 yr old M w/ PMHx Afib(s/p MAZE and MVR in 2011), CHF, Cirrhosis, Pulm HTN, DM, ESRD missed his last 2 HD sessions ( last HD was 4/6) Found confused with a new rash and oral bleeding by his daughter. Per EDP note: Duaghter states that when she went to his house his door was locked and he was screaming about pain in his wrist.  He also said that he could not walk.  She leaves he was confused because he kept telling her to come in even though the door was locked and did not seem to understand that it was locked.  EMS reports that they had to kick the door down to get to him.  When they found him he had a petechial rash covering his body and crusted blood around his mouth.  The daughter states that he has had this rash for some time and they have been going to a dermatologist but that it seems have gotten a lot worse lately.  She also states that he has had bleeding from his mouth and sometimes coughs up blood because of his CHF but also she noticed bleeding from 1 of his old tooth extraction site that has been oozing slowly.  She has been helping him clean it up.  Review of the EMR shows that his last INR on 02/23/2019 was 6.   Admitted to Hospitalist Renal  consulted for HD. Prior to patient moving to floor he went into NSVT-> sustained VT now on Amio and back in sinus. PCCM consulted for evaluation.  Past Medical History  .Marland Kitchen Active Ambulatory Problems    Diagnosis Date Noted  . Calf pain 09/10/2015  . Type II diabetes mellitus with renal manifestations ([place]) 09/10/2015  . Essential hypertension 09/10/2015  . Mitral valve disease   . Tricuspid valve disease 10/09/2015  . Anemia - mild exacerbation 10/15/2015  . Encounter for therapeutic drug monitoring 10/19/2015  . Gout 10/23/2015  . Polypharmacy 11/23/2015  . Chronic atrial fibrillation 12/24/2015  . Dilated cardiomyopathy ([place]) 12/24/2015  . Acute hypoxemic respiratory failure ([place]) 12/29/2015  . Acute respiratory failure ([place]) 12/29/2015  . Asthma   . Pain in joint, ankle and foot 01/10/2016  . Atrial fibrillation with RVR ([place]) 02/19/2016  . Abdominal pain 02/25/2016  . Diabetes mellitus with complication ([place]) 05/16/1600  . Permanent atrial fibrillation 02/25/2016  . Congestive heart failure (CHF) ([place]) 02/25/2016  . Anemia, chronic disease 02/25/2016  . A-fib ([place]) 02/26/2016  . Aortic regurgitation 02/27/2016  . Viral gastroenteritis 02/27/2016  . GERD (gastroesophageal reflux disease) 03/04/2016  . Acute renal failure (ARF) ([place]) 03/14/2016  . Acute hepatic encephalopathy 03/15/2016  . Liver cirrhosis ([place]) 03/15/2016  .  Volume overload 03/15/2016  . Acute congestive heart failure ([place])   . Insulin dependent diabetes mellitus ([place])   . Supratherapeutic INR 06/01/2016  . Insomnia 06/10/2016  . HLD (hyperlipidemia) 10/05/2016  . Mouth bleeding 10/05/2016  . Bilateral knee pain 10/05/2016  . Petechial rash 10/05/2016  . Laceration of tongue   . Anasarca 05/30/2017  . Acute kidney injury ([place]) 06/25/2017  . Palliative care by specialist   . Goals of care, counseling/discussion   . ESRD (end stage renal disease) on dialysis ([place])   . Rectal bleeding   . Benign  neoplasm of ascending colon   . Biventricular heart failure ([place]) 08/10/2017  . Syncope and collapse 08/10/2017  . Cardiac arrest ([place])   . Chest pain   . Pulmonary hypertension, unspecified ([place])   . Chronic renal failure   . Advance care planning   . Dyspnea 09/18/2017  . CAD (coronary artery disease) 11/24/2017  . Hyperkalemia 11/24/2017  . Fluid overload 11/24/2017  . Elevated troponin 11/24/2017  . ESRD (end stage renal disease) ([place]) 12/01/2017  . Pleural effusion   . Acute encephalopathy 08/06/2018  . Fall 02/03/2019  . Left wrist fracture, closed, initial encounter 02/03/2019  . Hypotension 02/03/2019   Resolved Ambulatory Problems    Diagnosis Date Noted  . Atrial fibrillation ([place]) 09/10/2015  . Atrial fibrillation with rapid ventricular response ([place]) 09/10/2015  . Acute on chronic combined systolic and diastolic CHF (congestive heart failure) ([place])   . Chronic systolic CHF (congestive heart failure) ([place]) 10/09/2015  . Pancreatitis, ruled out 02/25/2016  . (HFpEF) heart failure with preserved ejection fraction ([place]) 05/30/2017  . Acute on chronic combined systolic and diastolic CHF (congestive heart failure) ([place]) 06/27/2017  . Chronic right-sided HF (heart failure) ([place]) 08/10/2017  . SIRS (systemic inflammatory response syndrome) ([place]) 12/01/2017  . Septic shock [facility])    Past Medical History:  Diagnosis Date  . Anemia of chronic disease   . Chronic kidney disease (CKD), stage IV (severe) ([place])   . Cirrhosis ([place])   . Coronary artery disease   . Diabetes mellitus type 2 in obese ([place])   . Diabetes mellitus without complication ([place])   . H/O tricuspid valve repair 2012  . History of echocardiogram   . Hypertension   . Obstructive sleep apnea   . Paroxysmal atrial fibrillation (HCC)   . Pulmonary HTN ([place])   . 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 Thrombocytopenia ([place])     Significant Hospital Events   Non sustained VT- 4/11 Sustained  VT - 4/11  Consults:  Renal 4/11 Cardiology 4/11 PCCM 4/11  Procedures:  none  Significant Diagnostic Tests:  GFR 5 Trop 0.04  Na + 131 K= 4.8 Bicarb 20 Na 131 Bun 77 Cr 12.06  Calcium 9.1 Mg 2.2 AG 19 WBC 13.7 Hgb 9.7 Plts 213  PT38.9 INR 4.1    Micro Data:  Blood cx x 2>>02/26/2019  Antimicrobials:  none   Objective   Blood pressure (!) 113/97, pulse (!) 117, temperature 98 F (36.7 C), temperature source Oral, resp. rate 17, SpO2 96 %.       No intake or output data in the 24 hours ending 02/26/19 2219 There were no vitals filed for this visit.  Examination: General: thin male moans denies pain but states he is tired HENT: normocephalic with dried blood in oropharynx Lungs: diminished at bases with no wheezing  Cardiovascular: S1 and S2 irregular Abdomen: soft scaphoid non distended hypoactive Extremities: no  edema Neuro: speaks some english can answer basic questions no focal deficits SKIN: dry warm diffuse chronic petechiae rash with no blanching almost confluent extends over shoulders, thighs, over knees, over some of the trunk. Not pruritic.   Resolved Hospital Problem list   Sustained VT- broke with Amio  Assessment & Plan:  1. Non sustained VTach-> VTach - pt had pulse throughout transient hypotension during episode - was given Amiodarone gtt - continues on Cardiac monitoring  - Cardiology consulted and agree with their recommendations - Trop 0.04  - K= 4.8 Bicarb 20 Na 131 Bun 77 Cr 12.06 Calcium 9.8 Mg 2.2 AG   Plan: Continue on cardiac monitoring Pt is hemodynamically stable and alert at the time of my evaluation. If pt has transient hypotension hold amiodarone No gross electrolyte abnormalities AG metabolic Acidosis- Uremia Needs emergent HD Renal on board Can go to [place] under Hospitalist   2. Oral bleeding  secondary to multiple etiologies including: - platelet dysfunction in the setting of uremia  - Hypochromic Microcytic Anemia- most  likely Anemia of chronic disease - Also on chronic anticoagulation for Afib - Has a h/o Permanent Afib s/p MAZE procedure, MVR(porcine), TV Repair, and CHF ( Coags: PT 38.9 INR 4.1 ) - Also has cirrhosis  Plan: Monitor for bleeding Goal INR 2-3. Hold warfarin dose and repeat Coags in AM Daily Hgb check Aranesp, Epo per Renal recommendation would also check Iron level. Hgb goal not to exceed 10-11 ( Hgb >11 in ESRD associated with stroke, HTN and lymphoma) F/u blood cx and ECHO--> h/o bioprosthetic mitral valve>> r/o endocarditis  3. H/o Pulm HTN On Last ECHO PAP was 66 mmHg RHC in 2018:  PAP/Mean: 44/20/35 mmHg PCWP: 24 mmhg  LVP/EDP: 87/6/ ~14-18 mmHg  Right Atrium Right atrial pressure is elevated with large V wave and rapid Y descent. RAP mean 18 mmHg  Right Ventricle RVP/EDP 45/8/16 mmHg   Plan: Given elevated wedge PH Type II  secondary to left sided dysfunction w/ some component of fluid overload. Continue on supplemental Oxygen @ 2L   Best practice:  Diet: NPO Pain/Anxiety/Delirium protocol (if indicated): not indicated at this time VAP protocol (if indicated): pt is not intubated DVT prophylaxis: none at this time pt has a supratherapeutic INR GI prophylaxis: not indicated no h/o steroids not intubated Glucose control: if BG exceeds 180mg /dl consider ISS Mobility: Bedrest, Fall precautions, Aspiration precautions and Neuro-checks recommended Code Status: FULL Family Communication: Daughter is primary contact Disposition: Ad[facility] to [facility] with Renal and Cardiology following  We appreciate consult but no role for PCCM management at this time. Please re-consult if pt clinically deteriorates   Labs   CBC: Recent Labs  Lab 02/26/19 1253  WBC 13.7*  NEUTROABS 12.1*  HGB 9.7*  HCT 28.5*  MCV 80.3  PLT 673    Basic Metabolic Panel: Recent Labs  Lab 02/26/19 1253 02/26/19 2116  NA 131* 131*  K 4.5 4.8  CL 89* 92*  CO2 19* 20*  GLUCOSE 67* 70  BUN 71* 77*   CREATININE 12.17* 12.06*  CALCIUM 9.5 9.1  MG  --  2.2   GFR: CrCl cannot be calculated (Unknown ideal weight.). Recent Labs  Lab 02/26/19 1253  WBC 13.7*    Liver Function Tests: Recent Labs  Lab 02/26/19 1253  AST 46*  ALT 20  ALKPHOS 146*  BILITOT 1.5*  PROT 8.0  ALBUMIN 3.3*   No results for input(s): LIPASE, AMYLASE in the last 168 hours. No results for  input(s): AMMONIA in the last 168 hours.  ABG    Component Value Date/Time   PHART 7.399 08/07/2018 0620   PCO2ART 34.0 08/07/2018 0620   PO2ART 222 (H) 08/07/2018 0620   HCO3 20.7 08/07/2018 0620   TCO2 31 09/13/2018 2038   ACIDBASEDEF 3.4 (H) 08/07/2018 0620   O2SAT 99.8 08/07/2018 0620     Coagulation Profile: Recent Labs  Lab 02/21/19 02/26/19 1253  INR 6.0* 4.1*    Cardiac Enzymes: No results for input(s): CKTOTAL, CKMB, CKMBINDEX, TROPONINI in the last 168 hours.  HbA1C: Hemoglobin A1C  Date/Time Value Ref Range Status  09/01/2017 09:22 AM 5.2  Final   HbA1c, POC (controlled diabetic range)  Date/Time Value Ref Range Status  11/02/2018 02:51 PM 5.7 0.0 - 7.0 % Final   Hgb A1c MFr Bld  Date/Time Value Ref Range Status  11/24/2017 05:21 AM 4.9 4.8 - 5.6 % Final    Comment:    (NOTE) Pre diabetes:          5.7%-6.4% Diabetes:              >6.4% Glycemic control for   <7.0% adults with diabetes   05/30/2017 03:38 PM 5.5 4.8 - 5.6 % Final    Comment:    (NOTE)         Pre-diabetes: 5.7 - 6.4         Diabetes: >6.4         Glycemic control for adults with diabetes: <7.0     CBG: Recent Labs  Lab 02/26/19 1446 02/26/19 1506 02/26/19 1619  GLUCAP 45* 160* 69*    Review of Systems:   Marland KitchenMarland KitchenReview of Systems  Constitutional: Positive for malaise/fatigue.  HENT: Negative for congestion and sore throat.   Eyes: Negative for blurred vision and double vision.  Respiratory: Negative for shortness of breath and wheezing.   Cardiovascular: Negative for chest pain, palpitations and leg  swelling.  Gastrointestinal: Negative for abdominal pain.  Skin: Positive for rash. Negative for itching.    Past Medical History  He,  has a past medical history of Abdominal pain (02/25/2016), Acute encephalopathy (08/06/2018), Acute hypoxemic respiratory failure ([place]) (12/29/2015), Advance care planning, Anemia - mild exacerbation (10/15/2015), Anemia of chronic disease, Aortic regurgitation (02/27/2016), Asthma, Atrial fibrillation with RVR ([place]) (02/19/2016), Calf pain (09/10/2015), Chronic kidney disease (CKD), stage IV (severe) (HCC), Chronic systolic CHF (congestive heart failure) ([place]), Cirrhosis ([place]), Congestive heart failure (CHF) ([place]) (02/25/2016), Coronary artery disease, Diabetes mellitus type 2 in obese ([place]), Diabetes mellitus without complication ([place]), Dilated cardiomyopathy ([place]) (12/24/2015), Dyspnea, Elevated troponin (11/24/2017), ESRD (end stage renal disease) on dialysis [facility]), Fluid overload (11/24/2017), GERD (gastroesophageal reflux disease), Gout (10/23/2015), H/O tricuspid valve repair (2012), History of echocardiogram, HLD (hyperlipidemia) (10/05/2016), Hyperkalemia (11/24/2017), Hypertension, Mitral valve disease, Obstructive sleep apnea, Pancreatitis, ruled out (02/25/2016), Paroxysmal atrial fibrillation ([place]), Permanent atrial fibrillation (02/25/2016), Pleural effusion, Polypharmacy (11/23/2015), Pulmonary HTN ([place]), S/P mitral valve replacement with porcine valve (2012), Septic shock ([place]), Syncope and collapse (08/10/2017), Thrombocytopenia ([place]), and Viral gastroenteritis (02/27/2016).   Surgical History    Past Surgical History:  Procedure Laterality Date  . AV FISTULA PLACEMENT Left 07/13/2017   Procedure: LEFT ARM ARTERIOVENOUS (AV) FISTULA CREATION;  Surgeon: Waynetta Sandy, MD;  Location: [place];  Service: Vascular;  Laterality: Left;  . BASCILIC VEIN TRANSPOSITION Left 11/03/2017   Procedure: BASILIC VEIN TRANSPOSITION SECOND STAGE;  Surgeon: Waynetta Sandy,  MD;  Location: [place];  Service: Vascular;  Laterality: Left;  .  CARDIAC SURGERY    . CARDIAC VALVE REPLACEMENT  2011  . CARDIOVERSION N/A 07/09/2017   Procedure: CARDIOVERSION;  Surgeon: Larey Dresser, MD;  Location: [facility] ENDOSCOPY;  Service: Cardiovascular;  Laterality: N/A;  . COLONOSCOPY N/A 07/16/2017   Procedure: COLONOSCOPY;  Surgeon: Jerene Bears, MD;  Location: [facility];  Service: Gastroenterology;  Laterality: N/A;  . ESOPHAGOGASTRODUODENOSCOPY N/A 07/16/2017   Procedure: ESOPHAGOGASTRODUODENOSCOPY (EGD);  Surgeon: Jerene Bears, MD;  Location: [facility] ENDOSCOPY;  Service: Gastroenterology;  Laterality: N/A;  . INSERTION OF DIALYSIS CATHETER Right 07/13/2017   Procedure: INSERTION OF DIALYSIS CATHETER RIGHT INTERNAL JUGULAR;  Surgeon: Waynetta Sandy, MD;  Location: [place];  Service: Vascular;  Laterality: Right;  . RIGHT HEART CATH N/A 06/29/2017   Procedure: RIGHT HEART CATH;  Surgeon: Larey Dresser, MD;  Location: [place] CV LAB;  Service: Cardiovascular;  Laterality: N/A;  . RIGHT/LEFT HEART CATH AND CORONARY ANGIOGRAPHY N/A 08/19/2017   Procedure: RIGHT/LEFT HEART CATH AND CORONARY ANGIOGRAPHY;  Surgeon: Leonie Man, MD;  Location: [place] CV LAB;  Service: Cardiovascular;  Laterality: N/A;  . TEE WITHOUT CARDIOVERSION N/A 07/09/2017   Procedure: TRANSESOPHAGEAL ECHOCARDIOGRAM (TEE);  Surgeon: Larey Dresser, MD;  [facility] END[facility]COPY;  Service: Cardiovascular;  Laterality: N/A;     Social History   reports that he has quit smoking. His smoking use included cigarettes. He smoked 1.00 pack per day. He has never used smokeless tobacco. He reports that he does not drink alcohol or use drugs.   Family History   His family history is negative for Heart attack.   Allergies Allergies  Allergen Reactions  . Triamcinolone Other (See Comments)    Felt like skin was burning     Home Medications  Prior to Admission medications   Medication Sig Start Date End  Date Taking? Authorizing Provider  amiodarone (PACERONE) 200 MG tablet TAKE 1 TABLET BY MOUTH TWICE A DAY Patient taking differently: Take 200 mg by mouth 2 (two) times daily.  07/14/18   Fay Records, MD  atorvastatin (LIPITOR) 20 MG tablet TAKE 1 TABLET (20 MG TOTAL) BY MOUTH DAILY. PLEASE KEEP UPCOMING APPT. THANK YOU 01/14/19   Fay Records, MD  B Complex-C-Zn-Folic Acid (DIALYVITE/ZINC) TABS Take 1 tablet by mouth daily. 09/15/18   [provider]  baclofen (LIORESAL) 10 MG tablet Take 0.5 tablets (5 mg total) by mouth 3 (three) times daily as needed for muscle spasms. 12/21/18   Hilts, Legrand Como, MD  calcium acetate (PHOSLO) 667 MG capsule Take 1 capsule (667 mg total) by mouth 3 (three) times daily with meals. Please keep upcoming appt. Thank You 03/09/18   Fay Records, MD  cetirizine (ZYRTEC) 10 MG tablet Take 1 tablet (10 mg total) by mouth daily. 12/09/18   Argentina Donovan, PA-C  colchicine 0.6 MG tablet Take 0.5 tablets (0.3 mg total) by mouth 2 (two) times a week. 09/21/17   Tawny Asal, MD  diazepam (VALIUM) 5 MG tablet Take 5 mg by mouth at bedtime as needed (sleep).    [provider]  diphenhydramine-acetaminophen (TYLENOL PM) 25-500 MG TABS tablet Take 1 tablet by mouth at bedtime as needed (pain).    [provider]  DULoxetine (CYMBALTA) 60 MG capsule Take 1 capsule (60 mg total) by mouth daily. For back pain 01/06/19   Charlott Rakes, MD  fluticasone (FLONASE) 50 MCG/ACT nasal spray Place 2 sprays into both nostrils daily. 12/14/17   Bonnielee Haff, MD  folic acid (  FOLVITE) 1 MG tablet TAKE 1 TABLET BY MOUTH EVERY DAY Patient taking differently: Take 1 mg by mouth daily.  11/22/18   Charlott Rakes, MD  gabapentin (NEURONTIN) 100 MG capsule Take 200 mg by mouth at bedtime. FOR LEG PAIN    [provider]  lidocaine (XYLOCAINE) 2 % solution Swish and spit 15 ml Q 6 hrs PRN 09/14/18   Ladell Pier, MD  metoprolol tartrate (LOPRESSOR) 25 MG  tablet Take 1 tablet (25 mg total) by mouth 2 (two) times daily. Patient taking differently: Take 12.5 mg by mouth 2 (two) times daily.  07/22/18   Daune Perch, NP  midodrine (PROAMATINE) 10 MG tablet Take 5 mg by mouth 3 (three) times daily with meals.  12/21/18   [provider]  multivitamin (RENA-VIT) TABS tablet Take 1 tablet by mouth at bedtime. 12/14/17   Bonnielee Haff, MD  OXYGEN Inhale 2 L into the lungs daily as needed (shortness of breath).     [provider]  traZODone (DESYREL) 100 MG tablet Take 1 tablet (100 mg total) by mouth at bedtime. for sleep 01/06/19   Charlott Rakes, MD  warfarin (COUMADIN) 2.5 MG tablet TAKE AS DIRECTED BY COUMADIN CLINIC 02/10/19   Jerline Pain, MD      I, Dr Seward Carol have personally reviewed patient's available data, including medical history, events of note, physical examination and test results as part of my evaluation. I have discussed with Dr Myna Hidalgo and RN and Warren Lacy.  In addition,  I personally evaluated patient  The patient required high complexity decision making for assessment and support, frequent evaluation and titration of therapies, application of advanced monitoring technologies and extensive interpretation of multiple databases.   Critical Care Consult Time devoted to patient care services described in this note is 40 Minutes. This time reflects time of care of this signee Dr Seward Carol. This critical care time does not reflect procedure time, or teaching time or supervisory time  but could involve care discussion time   CC TIME: 55 minutes CODE STATUS: FULL DISPOSITION: SD with TRH PROGNOSIS: Guarded  Dr. Seward Carol Pulmonary Critical Care Medicine  02/26/2019 11:04 PM  Critical care time: 55 mins

## 2019-02-26 NOTE — ED Provider Notes (Signed)
[facility] EMERGENCY DEPARTMENT Provider Note   CSN: [id number] Arrival date & time: 02/26/19  1228    History   Chief Complaint Chief Complaint  Patient presents with   Oral Bleeding    HPI Michael [name] is a 58 y.o. male with a past medical history of end-stage renal disease on dialysis 4 days a week (M/W/F/S), CHF, cirrhosis, A. fib on chronic anticoagulation with Coumadin, history of thrombocytopenia, pulmonary hypertension, status post mitral valve replacement, diabetes who presents to the emergency department for wrist pain, confusion and bleeding.  History is gathered from the patient's daughter via telephone.  There is both a language barrier and altered mental status.  Daughter reports that he was called by the dialysis center because the patient has missed 2 dialysis appointments.  His last session was Monday.  She states that when she went to his house his door was locked and he was screaming about pain in his wrist.  He also said that he could not walk.  She leaves he was confused because he kept telling her to come in even though the door was locked and did not seem to understand that it was locked.  EMS reports that they had to kick the door down to get to him.  When they found him he had a petechial rash covering his body and crusted blood around his mouth.  The daughter states that he has had this rash for some time and they have been going to a dermatologist but that it seems have gotten a lot worse lately.  She also states that he has had bleeding from his mouth and sometimes coughs up blood because of his CHF but also she noticed bleeding from 1 of his old tooth extraction site that has been oozing slowly.  She has been helping him clean it up.  Review of the EMR shows that his last INR on 02/23/2019 was 6.  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 prior EF normal; b. Echo 10/16: Mild LVH, EF 30-35%, mitral valve bioprosthesis present without evidence of stenosis or regurgitation, severe LAE, mild RVE with mild to moderately reduced RVSF, moderate RAE   Cirrhosis (HCC)    Congestive heart failure (CHF) ([place]) 02/25/2016   Coronary artery disease    Diabetes mellitus type 2 in obese ([place])    Diabetes mellitus without complication ([place])    Dilated cardiomyopathy ([place]) 12/24/2015   Dyspnea    Elevated troponin 11/24/2017   ESRD (end stage renal disease) on dialysis ([place])    Fluid overload 11/24/2017   GERD (gastroesophageal reflux disease)    Gout 10/23/2015   H/O tricuspid valve repair 2012   History of echocardiogram    Echo at [facility] in [facility] 4/12: mod LVH, EF 60-65%, mod LAE, tissue MVR ok, mod TR, mod pulmo HTN  with RVSP 48 mmHg   HLD (hyperlipidemia) 10/05/2016   Hyperkalemia 11/24/2017   Hypertension    Mitral valve disease    Obstructive sleep apnea    Pancreatitis, ruled out 02/25/2016   Paroxysmal atrial fibrillation ([place])    s/p Maze procedure at time of MVR in 2011   Permanent atrial fibrillation 02/25/2016   Pleural effusion    Polypharmacy 11/23/2015   Pulmonary HTN ([place])    S/P mitral valve replacement with porcine valve 2012   Septic shock ([place])    Syncope and collapse 08/10/2017   Thrombocytopenia ([place])    Viral gastroenteritis 02/27/2016    Patient Active Problem List   Diagnosis Date Noted     Fall 02/03/2019   Left wrist fracture, closed, initial encounter 02/03/2019   Hypotension 02/03/2019   Acute encephalopathy 08/06/2018   Pleural effusion    ESRD (end stage renal disease) ([place]) 12/01/2017   CAD (coronary artery disease) 11/24/2017   Hyperkalemia 11/24/2017   Fluid overload 11/24/2017   Elevated troponin 11/24/2017   Dyspnea 09/18/2017   Chronic renal failure    Advance care planning    Pulmonary hypertension, unspecified ([place])    Chest pain    Biventricular heart failure ([place]) 08/10/2017   Syncope and collapse 08/10/2017   Cardiac arrest ([place])    Benign neoplasm of ascending colon    Rectal bleeding    ESRD (end stage renal disease) on dialysis ([place])    Goals of care, counseling/discussion    Palliative care by specialist    Acute kidney injury ([place]) 06/25/2017   Anasarca 05/30/2017   HLD (hyperlipidemia) 10/05/2016   Mouth bleeding 10/05/2016   Bilateral knee pain 10/05/2016   Petechial rash 10/05/2016   Laceration of tongue    Insomnia 06/10/2016   Supratherapeutic INR 06/01/2016   Acute hepatic encephalopathy 03/15/2016   Liver cirrhosis ([place]) 03/15/2016   Volume overload 03/15/2016   Acute congestive heart failure (HCC)    Insulin dependent diabetes mellitus ([place])    Acute renal failure (ARF) ([place]) 03/14/2016   GERD (gastroesophageal reflux disease) 03/04/2016   Aortic regurgitation 02/27/2016   Viral gastroenteritis 02/27/2016   A-fib ([place]) 02/26/2016   Abdominal pain 02/25/2016   Diabetes mellitus with complication (HCC) 32/95/1884   Permanent atrial fibrillation 02/25/2016   Congestive heart failure (CHF) ([place]) 02/25/2016   Anemia, chronic disease 02/25/2016   Atrial fibrillation with RVR ([place]) 02/19/2016   Pain in joint, ankle and foot 01/10/2016   Asthma    Acute hypoxemic respiratory failure ([place]) 12/29/2015   Acute respiratory failure ([place]) 12/29/2015   Chronic atrial fibrillation  12/24/2015   Dilated cardiomyopathy ([place]) 12/24/2015   Polypharmacy 11/23/2015   Gout 10/23/2015   Encounter for therapeutic drug monitoring 10/19/2015   Anemia - mild exacerbation 10/15/2015   Tricuspid valve disease 10/09/2015   Mitral valve disease    Calf pain 09/10/2015   Type II diabetes mellitus with renal manifestations ([place]) 09/10/2015   Essential hypertension 09/10/2015    Past Surgical History:  Procedure Laterality Date   AV FISTULA PLACEMENT Left 07/13/2017   Procedure: LEFT ARM ARTERIOVENOUS (AV) FISTULA CREATION;  Surgeon: Waynetta Sandy, MD;  Location: [facility];  Service: Vascular;  Laterality: Left;   BASCILIC VEIN TRANSPOSITION Left 11/03/2017   Procedure: BASILIC VEIN TRANSPOSITION SECOND STAGE;  Surgeon: Waynetta Sandy, MD;  Location: [facility];  Service: Vascular;  Laterality: Left;   CARDIAC SURGERY     CARDIAC VALVE REPLACEMENT  2011   CARDIOVERSION N/A  07/09/2017   Procedure: CARDIOVERSION;  Surgeon: Larey Dresser, MD;  Location: [facility] ENDOSCOPY;  Service: Cardiovascular;  Laterality: N/A;   COLONOSCOPY N/A 07/16/2017   Procedure: COLONOSCOPY;  Surgeon: Jerene Bears, MD;  Location: [facility];  Service: Gastroenterology;  Laterality: N/A;   ESOPHAGOGASTRODUODENOSCOPY N/A 07/16/2017   Procedure: ESOPHAGOGASTRODUODENOSCOPY (EGD);  Surgeon: Jerene Bears, MD;  Location: [facility] ENDOSCOPY;  Service: Gastroenterology;  Laterality: N/A;   INSERTION OF DIALYSIS CATHETER Right 07/13/2017   Procedure: INSERTION OF DIALYSIS CATHETER RIGHT INTERNAL JUGULAR;  Surgeon: Waynetta Sandy, MD;  Location: [facility];  Service: Vascular;  Laterality: Right;   RIGHT HEART CATH N/A 06/29/2017   Procedure: RIGHT HEART CATH;  Surgeon: Larey Dresser, MD;  Location: [facility] CV LAB;  Service: Cardiovascular;  Laterality: N/A;   RIGHT/LEFT HEART CATH AND CORONARY ANGIOGRAPHY N/A 08/19/2017   Procedure: RIGHT/LEFT HEART CATH AND CORONARY ANGIOGRAPHY;   Surgeon: Leonie Man, MD;  Location: [facility] CV LAB;  Service: Cardiovascular;  Laterality: N/A;   TEE WITHOUT CARDIOVERSION N/A 07/09/2017   Procedure: TRANSESOPHAGEAL ECHOCARDIOGRAM (TEE);  Surgeon: Larey Dresser, MD;  Location: [facility] ENDOSCOPY;  Service: Cardiovascular;  Laterality: N/A;        Home Medications    Prior to Admission medications   Medication Sig Start Date End Date Taking? Authorizing Provider  amiodarone (PACERONE) 200 MG tablet TAKE 1 TABLET BY MOUTH TWICE A DAY Patient taking differently: Take 200 mg by mouth 2 (two) times daily.  07/14/18   Fay Records, MD  atorvastatin (LIPITOR) 20 MG tablet TAKE 1 TABLET (20 MG TOTAL) BY MOUTH DAILY. PLEASE KEEP UPCOMING APPT. THANK YOU 01/14/19   Fay Records, MD  B Complex-C-Zn-Folic Acid (DIALYVITE/ZINC) TABS Take 1 tablet by mouth daily. 09/15/18   [provider]  baclofen (LIORESAL) 10 MG tablet Take 0.5 tablets (5 mg total) by mouth 3 (three) times daily as needed for muscle spasms. 12/21/18   Hilts, Legrand Como, MD  calcium acetate (PHOSLO) 667 MG capsule Take 1 capsule (667 mg total) by mouth 3 (three) times daily with meals. Please keep upcoming appt. Thank You 03/09/18   Fay Records, MD  cetirizine (ZYRTEC) 10 MG tablet Take 1 tablet (10 mg total) by mouth daily. 12/09/18   Argentina Donovan, PA-C  colchicine 0.6 MG tablet Take 0.5 tablets (0.3 mg total) by mouth 2 (two) times a week. 09/21/17   Tawny Asal, MD  diazepam (VALIUM) 5 MG tablet Take 5 mg by mouth at bedtime as needed (sleep).    [provider]  diphenhydramine-acetaminophen (TYLENOL PM) 25-500 MG TABS tablet Take 1 tablet by mouth at bedtime as needed (pain).    [provider]  DULoxetine (CYMBALTA) 60 MG capsule Take 1 capsule (60 mg total) by mouth daily. For back pain 01/06/19   Charlott Rakes, MD  fluticasone (FLONASE) 50 MCG/ACT nasal spray Place 2 sprays into both nostrils daily. 12/14/17   Bonnielee Haff, MD  folic acid  (FOLVITE) 1 MG tablet TAKE 1 TABLET BY MOUTH EVERY DAY Patient taking differently: Take 1 mg by mouth daily.  11/22/18   Charlott Rakes, MD  gabapentin (NEURONTIN) 100 MG capsule Take 200 mg by mouth at bedtime. FOR LEG PAIN    [provider]  lidocaine (XYLOCAINE) 2 % solution Swish and spit 15 ml Q 6 hrs PRN 09/14/18   Ladell Pier, MD  metoprolol tartrate (LOPRESSOR) 25 MG tablet Take 1 tablet (25 mg total) by mouth  2 (two) times daily. Patient taking differently: Take 12.5 mg by mouth 2 (two) times daily.  07/22/18   Daune Perch, NP  midodrine (PROAMATINE) 10 MG tablet Take 5 mg by mouth 3 (three) times daily with meals.  12/21/18   [provider]  multivitamin (RENA-VIT) TABS tablet Take 1 tablet by mouth at bedtime. 12/14/17   Bonnielee Haff, MD  OXYGEN Inhale 2 L into the lungs daily as needed (shortness of breath).     [provider]  traZODone (DESYREL) 100 MG tablet Take 1 tablet (100 mg total) by mouth at bedtime. for sleep 01/06/19   Charlott Rakes, MD  warfarin (COUMADIN) 2.5 MG tablet TAKE AS DIRECTED BY COUMADIN CLINIC 02/10/19   Jerline Pain, MD    Family History Family History  Problem Relation Age of Onset   Heart attack Neg Hx     Social History Social History   Tobacco Use   Smoking status: Former Smoker    Packs/day: 1.00    Types: Cigarettes   Smokeless tobacco: Never Used  Substance Use Topics   Alcohol use: No   Drug use: No     Allergies   Triamcinolone   Review of Systems Review of Systems Ten systems reviewed and are negative for acute change, except as noted in the HPI.    Physical Exam Updated Vital Signs BP (!) 147/77 (BP Location: Right Arm)    Pulse 69    Temp 98 F (36.7 C) (Oral)    Resp (!) 25    SpO2 99%   Physical Exam Vitals signs and nursing note reviewed.  Constitutional:      General: He is not in acute distress.    Appearance: He is well-developed. He is not diaphoretic.      Comments: Patient moaning and yelling loudly.  He has his eyes shut tightly.  He is not speaking or responding.  He is grabbing the left wrist which is currently in a splint.  HENT:     Head: Normocephalic and atraumatic.     Nose: Nose normal.     Mouth/Throat:     Comments: Dried blood crusting the lips.  There is bright red blood coating the mouth with clots on the roof of the mouth and throat.  No source of bleeding identified Eyes:     General: No scleral icterus.    Conjunctiva/sclera: Conjunctivae normal.  Neck:     Musculoskeletal: Normal range of motion and neck supple.  Cardiovascular:     Rate and Rhythm: Normal rate and regular rhythm.     Heart sounds: Normal heart sounds.     Comments: Left upper extremity graft with palpable thrill Pulmonary:     Effort: Pulmonary effort is normal. No respiratory distress.     Breath sounds: Normal breath sounds.  Abdominal:     Palpations: Abdomen is soft.     Tenderness: There is no abdominal tenderness.  Musculoskeletal:     Comments: Splint on the left arm removed.  There is a distal forearm/wrist deformity present with bruising.  Compartments of the forearm are soft.  Excellent cap refill less than 2 seconds in the fingers which are warm and well-perfused.  Skin:    General: Skin is warm and dry.     Comments: Petechial rash noted over the arms abdomen and lower extremities.  Neurological:     Mental Status: He is alert.  Psychiatric:        Behavior: Behavior normal.  ED Treatments / Results  Labs (all labs ordered are listed, but only abnormal results are displayed) Labs Reviewed - No data to display  EKG None  ED ECG REPORT   Date: 02/27/2019  Rate: 67  Rhythm: atrial fibrillation  QRS Axis: normal  Intervals: normal  ST/T Wave abnormalities: normal  Conduction Disutrbances:right bundle branch block and left anterior fascicular block  Narrative Interpretation:   Old EKG Reviewed: unchanged  I have  personally reviewed the EKG tracing and agree with the computerized printout as noted.  Radiology No results found.  Procedures Procedures (including critical care time)  SPLINT APPLICATION Date/Time: 8:93 AM Authorized by: Margarita Mail Consent: Verbal consent obtained. Risks and benefits: risks, benefits and alternatives were discussed Consent given by: patient Splint applied by: orthopedic technician Location details: left wrist Splint type: sugar tong Supplies used: ortho glass Post-procedure: The splinted body part was neurovascularly unchanged following the procedure. Patient tolerance: Patient tolerated the procedure well with no immediate complications.     Medications Ordered in ED Medications - No data to display    Initial Impression / Assessment and Plan / ED Course  I have reviewed the triage vital signs and the nursing notes.  Pertinent labs & imaging results that were available during my care of the patient were reviewed by me and considered in my medical decision making (see chart for details).  Clinical Course as of Feb 27 803  Sat Feb 26, 2019  1411 Inr Improved but still supratherapeutic  INR(!!): 4.1 [AH]  1411 Hgb is stable  Hemoglobin(!): 9.7 [AH]  1628 Glucose-Capillary(!): 69 [AH]  1727 CO2(!): 19 [CG]  1727 Glucose(!): 67 [CG]  1727 Creatinine(!): 12.17 [CG]  1727 BUN(!): 71 [CG]  1727 Anion gap(!): 23 [CG]  1727 WBC(!): 13.7 [CG]  1727 Hemoglobin(!): 9.7 [CG]    Clinical Course User Index [AH] Margarita Mail, PA-C [CG] Kinnie Feil, PA-C       YB:OFBPZWCH and AMS EN:IDPOEUM is gathered by EMR,  Daughter  and Nephrologist. DDX:The differential diagnosis for AMS is extensive and includes, but is not limited to: drug overdose - opioids, alcohol, sedatives, antipsychotics, drug withdrawal, others; Metabolic: hypoxia, hypoglycemia, hyperglycemia, hypercalcemia, hypernatremia, hyponatremia, uremia, hepatic encephalopathy,  hypothyroidism, hyperthyroidism, vitamin B12 or thiamine deficiency, carbon monoxide poisoning, Wilson's disease, Lactic acidosis, DKA/HHOS; Infectious: meningitis, encephalitis, bacteremia/sepsis, urinary tract infection, pneumonia, neurosyphilis; Structural: Space-occupying lesion, (brain tumor, subdural hematoma, hydrocephalus,); Vascular: stroke, subarachnoid hemorrhage, coronary ischemia, hypertensive encephalopathy, CNS vasculitis, thrombotic thrombocytopenic purpura, disseminated intravascular coagulation, hyperviscosity; Psychiatric: Schizophrenia, depression; Other: Seizure, hypothermia, heat stroke, ICU psychosis, dementia -"sundowning." Labs: I reviewed the labs which show Uremia,Hypoglycemia, AGMA, leukocytosis, normocytic anemia, supratherapeutic INR improving from 2 days ago. Imaging: I personally reviewed the images (CT head, cxr, and and left wrist film) which show(s) No ICH or mass, chronic R pleural effusion, and R wrist fracture- unchanged. EKG: PNT:[id number] with encephalopathy- unable to use interpreter as patient not responding appropriately.  Needs dialysis- suspect metabolic encephalopathy- discussed case with DR. Hollie Salk of Urology who agrees, Patient disposition:admit - pending consult- given sign out to [facility] Patient condition: stable. The patient appears reasonably stabilized for admission considering the current resources, flow, and capabilities available in the ED at this time, and I doubt any other [facility] requiring further screening and/or treatment in the ED prior to admission.   Final Clinical Impressions(s) / ED Diagnoses   Final diagnoses:  Encephalopathy  Uremia  Oral bleeding  Supratherapeutic INR  Hypoglycemia  Wrist fracture, closed, left, with  routine healing, subsequent encounter    ED Discharge Orders    None       Margarita Mail, PA-C 02/27/19 0352    Sherwood Gambler, MD 02/28/19 1409

## 2019-02-26 NOTE — ED Notes (Signed)
ED TO INPATIENT HANDOFF REPORT  ED Nurse Name and Phone #: [phone]  S Name/Age/Gender Michael [name] 58 y.o. male Room/Bed: 022C/022C  Code Status   Code Status: Full Code Can not assess orientation due to refusal to answer questions.  Home/SNF/Other Home  Is this baseline? No   Triage Complete: Triage complete  Chief Complaint Oral Bleeding  Triage Note Pt BIB [facility]. Per EMS pt presented with oral/nasal bleeding. EMS reports that family stated they haven't seen pt since yesterday. Pt is a dialysis patient and has not been to dialysis since Monday. Per EMS pt recently fractured his left wrist and has a splint in place for that. Pt also has a generalized petechia rash. Pt presents to the ED with dried blood on lips and in mouth. Pt unable to speak English. Daughter is Michael [name] [phone].    Allergies Allergies  Allergen Reactions  . Triamcinolone Other (See Comments)    Felt like skin was burning    Level of Care/Admitting Diagnosis ED Disposition    ED Disposition Condition [facility] Area: [place] [[id number]]  Level of Care: Telemetry Medical [104]  Diagnosis: Metabolic encephalopathy [341.93.ICD-9-CM]  Admitting Physician: Patrecia Pour, EDWIN [[id number]]  Attending Physician: Patrecia Pour, EDWIN [phone]  Estimated length of stay: past midnight tomorrow  Certification:: I certify this patient will need inpatient services for at least 2 midnights  PT Class (Do Not Modify): Inpatient [101]  PT Acc Code (Do Not Modify): Private [1]       B Medical/Surgery History Past Medical History:  Diagnosis Date  . Abdominal pain 02/25/2016  . Acute encephalopathy 08/06/2018  . Acute hypoxemic respiratory failure ([place]) 12/29/2015  . Advance care planning   . Anemia - mild exacerbation 10/15/2015  . Anemia of chronic disease   . Aortic regurgitation 02/27/2016  . Asthma    said he does not know  . Atrial fibrillation with RVR  ([place]) 02/19/2016  . Calf pain 09/10/2015  . Chronic kidney disease (CKD), stage IV (severe) ([place])   . Chronic systolic CHF (congestive heart failure) (HCC)    a. prior EF normal; b. Echo 10/16: Mild LVH, EF 30-35%, mitral valve bioprosthesis present without evidence of stenosis or regurgitation, severe LAE, mild RVE with mild to moderately reduced RVSF, moderate RAE  . Cirrhosis ([place])   . Congestive heart failure (CHF) ([place]) 02/25/2016  . Coronary artery disease   . Diabetes mellitus type 2 in obese ([place])   . Diabetes mellitus without complication ([place])   . Dilated cardiomyopathy ([place]) 12/24/2015  . Dyspnea   . Elevated troponin 11/24/2017  . ESRD (end stage renal disease) on dialysis ([place])   . Fluid overload 11/24/2017  . GERD (gastroesophageal reflux disease)   . Gout 10/23/2015  . H/O tricuspid valve repair 2012  . History of echocardiogram    Echo at [name] in [facility] 4/12: mod LVH, EF 60-65%, mod LAE, tissue MVR ok, mod TR, mod pulmo HTN with RVSP 48 mmHg  . HLD (hyperlipidemia) 10/05/2016  . Hyperkalemia 11/24/2017  . Hypertension   . Mitral valve disease   . Obstructive sleep apnea   . Pancreatitis, ruled out 02/25/2016  . Paroxysmal atrial fibrillation (HCC)    s/p Maze procedure at time of MVR in 2011  . Permanent atrial fibrillation 02/25/2016  . Pleural effusion   . Polypharmacy 11/23/2015  . Pulmonary HTN ([place])   . 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 Septic shock (  [place])   . Syncope and collapse 08/10/2017  . Thrombocytopenia ([place])   . Viral gastroenteritis 02/27/2016   Past Surgical History:  Procedure Laterality Date  . AV FISTULA PLACEMENT Left 07/13/2017   Procedure: LEFT ARM ARTERIOVENOUS (AV) FISTULA CREATION;  Surgeon: Waynetta Sandy, MD;  Location: [place];  Service: Vascular;  Laterality: Left;  . BASCILIC VEIN TRANSPOSITION Left 11/03/2017   Procedure: BASILIC VEIN TRANSPOSITION SECOND STAGE;  Surgeon: Waynetta Sandy, MD;  Location:  [place];  Service: Vascular;  Laterality: Left;  . CARDIAC SURGERY    . CARDIAC VALVE REPLACEMENT  2011  . CARDIOVERSION N/A 07/09/2017   Procedure: CARDIOVERSION;  Surgeon: Larey Dresser, MD;  Location: [facility] ENDOSCOPY;  Service: Cardiovascular;  Laterality: N/A;  . COLONOSCOPY N/A 07/16/2017   Procedure: COLONOSCOPY;  Surgeon: Jerene Bears, MD;  Location: [place];  Service: Gastroenterology;  Laterality: N/A;  . ESOPHAGOGASTRODUODENOSCOPY N/A 07/16/2017   Procedure: ESOPHAGOGASTRODUODENOSCOPY (EGD);  Surgeon: Jerene Bears, MD;  Location: [facility] ENDOSCOPY;  Service: Gastroenterology;  Laterality: N/A;  . INSERTION OF DIALYSIS CATHETER Right 07/13/2017   Procedure: INSERTION OF DIALYSIS CATHETER RIGHT INTERNAL JUGULAR;  Surgeon: Waynetta Sandy, MD;  Location: [place];  Service: Vascular;  Laterality: Right;  . RIGHT HEART CATH N/A 06/29/2017   Procedure: RIGHT HEART CATH;  Surgeon: Larey Dresser, MD;  Location: [place] CV LAB;  Service: Cardiovascular;  Laterality: N/A;  . RIGHT/LEFT HEART CATH AND CORONARY ANGIOGRAPHY N/A 08/19/2017   Procedure: RIGHT/LEFT HEART CATH AND CORONARY ANGIOGRAPHY;  Surgeon: Leonie Man, MD;  Location: [place] CV [facility];  Service: Cardiovascular;  Laterality: N/A;  . TEE WITHOUT CARDIOVERSION N/A 07/09/2017   Procedure: TRANSESOPHAGEAL ECHOCARDIOGRAM (TEE);  Surgeon: Larey Dresser, MD;  Location: [facility] ENDOSCOPY;  Service: Cardiovascular;  Laterality: N/A;     A IV Location/Drains/Wounds Patient Lines/Drains/Airways Status   Active Line/Drains/Airways    Name:   Placement date:   Placement time:   Site:   Days:   Peripheral IV 02/26/19 Right Antecubital   02/26/19    1300    Antecubital   less than 1   Fistula / Graft Left Upper arm Arteriovenous fistula   11/03/17    1116    Upper arm   480   Incision (Closed) 11/03/17 Arm Left   11/03/17    1024     480          Intake/Output Last 24 hours No intake or output data in the 24 hours ending  02/26/19 1855  Labs/Imaging Results for orders placed or performed during the hospital encounter of 02/26/19 (from the past 48 hour(s))  Comprehensive metabolic panel     Status: Abnormal   Collection Time: 02/26/19 12:53 PM  Result Value Ref Range   Sodium 131 (L) 135 - 145 mmol/L   Potassium 4.5 3.5 - 5.1 mmol/L   Chloride 89 (L) 98 - 111 mmol/L   CO2 19 (L) 22 - 32 mmol/L   Glucose, Bld 67 (L) 70 - 99 mg/dL   BUN 71 (H) 6 - 20 mg/dL   Creatinine, Ser 12.17 (H) 0.61 - 1.24 mg/dL   Calcium 9.5 8.9 - 10.3 mg/dL   Total Protein 8.0 6.5 - 8.1 g/dL   Albumin 3.3 (L) 3.5 - 5.0 g/dL   AST 46 (H) 15 - 41 U/L   ALT 20 0 - 44 U/L   Alkaline Phosphatase 146 (H) 38 - 126 U/L   Total Bilirubin 1.5 (H)  0.3 - 1.2 mg/dL   GFR calc non Af Amer 4 (L) >60 mL/min   GFR calc Af Amer 5 (L) >60 mL/min   Anion gap 23 (H) 5 - 15    Comment: Performed at [facility] [address] [place], [place] [address]  CBC with Differential     Status: Abnormal   Collection Time: 02/26/19 12:53 PM  Result Value Ref Range   WBC 13.7 (H) 4.0 - 10.5 K/uL   RBC 3.55 (L) 4.22 - 5.81 MIL/uL   Hemoglobin 9.7 (L) 13.0 - 17.0 g/dL   HCT 28.5 (L) 39.0 - 52.0 %   MCV 80.3 80.0 - 100.0 fL   MCH 27.3 26.0 - 34.0 pg   MCHC 34.0 30.0 - 36.0 g/dL   RDW 15.9 (H) 11.5 - 15.5 %   Platelets 213 150 - 400 K/uL   nRBC 0.0 0.0 - 0.2 %   Neutrophils Relative % 89 %   Neutro Abs 12.1 (H) 1.7 - 7.7 K/uL   Lymphocytes Relative 5 %   Lymphs Abs 0.7 0.7 - 4.0 K/uL   Monocytes Relative 6 %   Monocytes Absolute 0.8 0.1 - 1.0 K/uL   Eosinophils Relative 0 %   Eosinophils Absolute 0.0 0.0 - 0.5 K/uL   Basophils Relative 0 %   Basophils Absolute 0.0 0.0 - 0.1 K/uL   Immature Granulocytes 0 %   Abs Immature Granulocytes 0.06 0.00 - 0.07 K/uL    Comment: Performed at [facility], [place] [address] [place], [place] [address]  Protime-INR     Status: Abnormal   Collection Time: 02/26/19 12:53 PM  Result Value Ref Range    Prothrombin Time 38.9 (H) 11.4 - 15.2 seconds   INR 4.1 (HH) 0.8 - 1.2    Comment: REPEATED TO VERIFY CRITICAL RESULT CALLED TO, READ BACK BY AND VERIFIED WITH: [id number], N RN @ [phone] ON 02/26/2019 BY TEMOCHE, H Performed at [facility], [place] [address], [place], [place] [address]   Type and screen [place]     Status: None   Collection Time: 02/26/19 12:53 PM  Result Value Ref Range   ABO/RH(D) B POS    Antibody Screen NEG    Sample Expiration      03/01/2019 Performed at [facility], [place] [address], [place],  [address]   CBG monitoring, ED     Status: Abnormal   Collection Time: 02/26/19  2:46 PM  Result Value Ref Range   Glucose-Capillary 45 (L) 70 - 99 mg/dL  CBG monitoring, ED (now and then every hour for 3 hours)     Status: Abnormal   Collection Time: 02/26/19  3:06 PM  Result Value Ref Range   Glucose-Capillary 160 (H) 70 - 99 mg/dL  CBG monitoring, ED (now and then every hour for 3 hours)     Status: Abnormal   Collection Time: 02/26/19  4:19 PM  Result Value Ref Range   Glucose-Capillary 69 (L) 70 - 99 mg/dL   Dg Wrist 2 Views Left  Result Date: 02/26/2019 CLINICAL DATA:  58 year old who fell and injured the LEFT wrist approximately 1 month ago. Persistent pain. Subsequent encounter. EXAM: LEFT WRIST - 2 VIEW COMPARISON:  02/03/2019. FINDINGS: Subacute comminuted and impacted intra-articular fracture involving the distal radius into multiple fragments, unchanged from the 02/03/2019 examination with the exception of perhaps slightly more impaction currently. Stable comminuted fracture involving the distal ulna, including the ulnar styloid.  No new interval fractures. Radiocarpal joint space narrowing. Possible VISI deformity of the wrist. IMPRESSION: 1. Subacute comminuted and impacted intra-articular fracture involving the distal radius into multiple fragments, unchanged since the 02/03/19 examination with the exception of perhaps slightly  more impaction currently. 2. Stable comminuted fracture involving the distal ulna, including the ulnar styloid. 3. No new interval fractures. 4. Possible VISI deformity of the wrist. Electronically Signed   By: Evangeline Dakin M.D.   On: 02/26/2019 13:45   Ct Head Wo Contrast  Result Date: 02/26/2019 CLINICAL DATA:  Altered mental status. EXAM: CT HEAD WITHOUT CONTRAST TECHNIQUE: Contiguous axial images were obtained from the base of the skull through the vertex without intravenous contrast. COMPARISON:  CT scan of February 03, 2019. FINDINGS: Brain: No evidence of acute infarction, hemorrhage, hydrocephalus, extra-axial collection or mass lesion/mass effect. Vascular: No hyperdense vessel or unexpected calcification. Skull: Normal. Negative for fracture or focal lesion. Sinuses/Orbits: No acute finding. Other: None. IMPRESSION: No acute intracranial abnormality seen. Electronically Signed   By: Marijo Conception, M.D.   On: 02/26/2019 16:38   Dg Chest Port 1 View  Result Date: 02/26/2019 CLINICAL DATA:  Tachycardia EXAM: PORTABLE CHEST 1 VIEW COMPARISON:  02/03/2019 FINDINGS: Moderate cardiomegaly. Vascular congestion. Stable right pleural effusion and right basilar opacities. Minimal subsegmental atelectasis at the left base. Upper lungs clear. No pneumothorax. IMPRESSION: Stable vascular congestion. Stable right pleural effusion and basilar pulmonary opacities. Electronically Signed   By: Marybelle Killings M.D.   On: 02/26/2019 13:24    Pending Labs Unresulted Labs (From admission, onward)    Start     Ordered   02/27/19 0500  Protime-INR  Tomorrow morning,   R     02/26/19 1836   02/27/19 0500  CBC  Tomorrow morning,   R     02/26/19 1836   02/27/19 0500  APTT  Tomorrow morning,   R     02/26/19 1836   02/27/19 0500  Comprehensive metabolic panel  Tomorrow morning,   R     02/26/19 1836   02/26/19 1834  Culture, Urine  Once,   R     02/26/19 1836   02/26/19 1628  Rapid urine drug screen (hospital  performed)  ONCE - STAT,   R     02/26/19 1627   02/26/19 1244  Urinalysis, Routine w reflex microscopic  ONCE - STAT,   STAT     02/26/19 1246          Vitals/Pain Today's Vitals   02/26/19 1545 02/26/19 1700 02/26/19 1715 02/26/19 1730  BP:  97/82 (!) 127/98 (!) 124/101  Pulse:      Resp:  19  20  Temp:      TempSrc:      SpO2: 99%       Isolation Precautions No active isolations  Medications Medications  sodium chloride flush (NS) 0.9 % injection 3 mL (3 mLs Intravenous Given 02/26/19 1453)  sodium chloride flush (NS) 0.9 % injection 3 mL (has no administration in time range)  Chlorhexidine Gluconate Cloth 2 % PADS 6 each (has no administration in time range)  doxercalciferol (HECTOROL) injection 1 mcg (has no administration in time range)  amiodarone (PACERONE) tablet 200 mg (has no administration in time range)  metoprolol tartrate (LOPRESSOR) tablet 12.5 mg (has no administration in time range)  midodrine (PROAMATINE) tablet 5 mg (has no administration in time range)  calcium acetate (PHOSLO) capsule 667 mg (has no administration in time range)  folic acid (FOLVITE) tablet 1 mg (has no administration in time range)  baclofen (LIORESAL) tablet 5 mg (has no administration in time range)  loratadine (CLARITIN) tablet 10 mg (has no administration in time range)  fluticasone (FLONASE) 50 MCG/ACT nasal spray 2 spray (has no administration in time range)  lidocaine (XYLOCAINE) 2 % viscous mouth solution 15 mL (has no administration in time range)  oxyCODONE (Oxy IR/ROXICODONE) immediate release tablet 5 mg (has no administration in time range)  ondansetron (ZOFRAN) tablet 4 mg (has no administration in time range)    Or  ondansetron (ZOFRAN) injection 4 mg (has no administration in time range)  fentaNYL (SUBLIMAZE) injection 50 mcg (50 mcg Intravenous Given 02/26/19 1304)  ondansetron (ZOFRAN) injection 4 mg (4 mg Intravenous Given 02/26/19 1304)  dextrose 50 % solution 50 mL  (50 mLs Intravenous Given 02/26/19 1453)  HYDROmorphone (DILAUDID) injection 0.5 mg (0.5 mg Intravenous Given 02/26/19 1557)  dextrose 50 % solution 50 mL (50 mLs Intravenous Given 02/26/19 1638)    Mobility walks with person assist High fall risk   Focused Assessments    R Recommendations: See Admitting Provider Note  Report given to:   Additional Notes:  Patient has been yelling and moaning since he has arrived. Was told by the nephrologist that he speaks [place] and normally walks at baseline but has only been yelling in his language and will not respond to questions when addressed. Patient also keeps rolling around in the bed so it is hard to keep his monitoring equipment attached.

## 2019-02-26 NOTE — ED Notes (Signed)
Attempted to call report. Was told that the nurse is in a patient's room. Gave a call back number and was told that the nurse would contact me.

## 2019-02-26 NOTE — Consult Note (Addendum)
Cardiology Consult Note   Primary Physician: Charlott Rakes, MD PCP-Cardiologist:  [name], MD  Reason for Consultation: Abnormal ECG  HPI:    Michael [name] is seen today for evaluation of wide complex tachycardia at the request of Dr. Christia Reading Opyd.   Michael [name] is a 58 y.o. male with persistent atrial fibrillation, mitral valve disease status post bioprosthetic mitral valve replacement in 2012 at [facility] in [place], tricuspid valve repair, CAD with known RPDA CTO, ESRD on dialysis, OSA, diabetes, HTN, HL, CHF, pulmonary hypertension, anemia, poor adherence with medications. Echo done in [place] in 4/12 with normal LVF (EF 60-65%). Admitted in 10/16 with AF with RVR and a/c systolic CHF. EF was 30-35%.   Last echo 07/2018 showed EF 50-55% with RV pressure/volume overload, mod AI, mitral valve bioprosthesis with mild stenosis, severely dilated LA, RA, severe TR  History is obtained from other providers because patient is currently altered and unable to provide history. Daughter reports that he has missed 2 dialysis appointments.  His last session was Monday.  She went to his house his door was locked and he was screaming about pain in his wrist.  He also said that he could not walk.  He was confused because he kept telling her to come in even though the door was locked and did not seem to understand that it was locked.  EMS reports that they had to kick the door down to get to him.  When they found him he had a petechial rash covering his body and crusted blood around his mouth.  The daughter states that he has had this rash for some time and they have been going to a dermatologist but that it seems have gotten a lot worse lately.  She also states that he has had bleeding from his mouth and sometimes coughs up blood because of his CHF but also she noticed bleeding from 1 of his old tooth extraction site that has been oozing slowly.  She has been helping him clean it up.   Review of the EMR shows that his last INR on 02/23/2019 was 6.  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 Nephrology was consulted with plans for dialysis.  I was consulted by the hospitalist for cardiac rhythm change this evening. He has baseline afib with RBBB, LAFB which was evident on admission but developed a regular wide complex tachycardia which was caught on ECG. This rhythm spontaneously resolved without intervention and by the time of my evaluation his telemetry shows afib with RBBB again. New 12 lead ECG pending. Amiodarone and metoprolol are on his home med list although unknown when they were last taken INR supratherpeutic at 4.  Review of Systems: [y] = yes, [ ]  = no  Unable to obtain review of systems due to patient's current confusion  Home Medications Prior to Admission medications   Medication Sig Start Date End Date Taking? Authorizing Provider  amiodarone (PACERONE) 200 MG tablet TAKE 1 TABLET BY MOUTH TWICE A DAY Patient taking differently: Take 200 mg by mouth 2 (two) times daily.  07/14/18   Fay Records, MD  atorvastatin (LIPITOR) 20 MG tablet TAKE 1 TABLET (20 MG TOTAL) BY MOUTH DAILY. PLEASE KEEP UPCOMING APPT. THANK YOU 01/14/19   Fay Records, MD  B Complex-C-Zn-Folic Acid (DIALYVITE/ZINC) TABS Take 1 tablet by mouth daily. 09/15/18  [provider]  baclofen (LIORESAL) 10 MG tablet Take 0.5 tablets (5 mg total) by mouth 3 (three) times daily as needed for muscle spasms. 12/21/18   Hilts, Legrand Como, MD  calcium acetate (PHOSLO) 667 MG capsule Take 1 capsule (667 mg total) by mouth 3 (three) times daily with meals. Please keep upcoming appt. Thank You 03/09/18   Fay Records, MD  cetirizine (ZYRTEC) 10 MG tablet Take 1 tablet (10 mg total) by mouth daily. 12/09/18   Argentina Donovan, PA-C  colchicine 0.6 MG tablet Take 0.5  tablets (0.3 mg total) by mouth 2 (two) times a week. 09/21/17   Tawny Asal, MD  diazepam (VALIUM) 5 MG tablet Take 5 mg by mouth at bedtime as needed (sleep).    [provider]  diphenhydramine-acetaminophen (TYLENOL PM) 25-500 MG TABS tablet Take 1 tablet by mouth at bedtime as needed (pain).    [provider]  DULoxetine (CYMBALTA) 60 MG capsule Take 1 capsule (60 mg total) by mouth daily. For back pain 01/06/19   Charlott Rakes, MD  fluticasone (FLONASE) 50 MCG/ACT nasal spray Place 2 sprays into both nostrils daily. 12/14/17   Bonnielee Haff, MD  folic acid (FOLVITE) 1 MG tablet TAKE 1 TABLET BY MOUTH EVERY DAY Patient taking differently: Take 1 mg by mouth daily.  11/22/18   Charlott Rakes, MD  gabapentin (NEURONTIN) 100 MG capsule Take 200 mg by mouth at bedtime. FOR LEG PAIN    [provider]  lidocaine (XYLOCAINE) 2 % solution Swish and spit 15 ml Q 6 hrs PRN 09/14/18   Ladell Pier, MD  metoprolol tartrate (LOPRESSOR) 25 MG tablet Take 1 tablet (25 mg total) by mouth 2 (two) times daily. Patient taking differently: Take 12.5 mg by mouth 2 (two) times daily.  07/22/18   Daune Perch, NP  midodrine (PROAMATINE) 10 MG tablet Take 5 mg by mouth 3 (three) times daily with meals.  12/21/18   [provider]  multivitamin (RENA-VIT) TABS tablet Take 1 tablet by mouth at bedtime. 12/14/17   Bonnielee Haff, MD  OXYGEN Inhale 2 L into the lungs daily as needed (shortness of breath).     [provider]  traZODone (DESYREL) 100 MG tablet Take 1 tablet (100 mg total) by mouth at bedtime. for sleep 01/06/19   Charlott Rakes, MD  warfarin (COUMADIN) 2.5 MG tablet TAKE AS DIRECTED BY COUMADIN CLINIC 02/10/19   Jerline Pain, MD    Past Medical History: Past Medical History:  Diagnosis Date   Abdominal pain 02/25/2016   Acute encephalopathy 08/06/2018   Acute hypoxemic respiratory failure ([place]) 12/29/2015   Advance care planning    Anemia -  mild exacerbation 10/15/2015   Anemia of chronic disease    Aortic regurgitation 02/27/2016   Asthma    said he does not know   Atrial fibrillation with RVR ([place]) 02/19/2016   Calf pain 09/10/2015   Chronic kidney disease (CKD), stage IV (severe) (HCC)    Chronic systolic CHF (congestive heart failure) ([place])    a. prior EF normal; b. Echo 10/16: Mild LVH, EF 30-35%, mitral valve bioprosthesis present without evidence of stenosis or regurgitation, severe LAE, mild RVE with mild to moderately reduced RVSF, moderate RAE   Cirrhosis (HCC)    Congestive heart failure (CHF) ([place]) 02/25/2016   Coronary artery disease    Diabetes mellitus type 2 in obese ([place])    Diabetes mellitus without complication ([place])    Dilated cardiomyopathy ([place]) 12/24/2015  Dyspnea    Elevated troponin 11/24/2017   ESRD (end stage renal disease) on dialysis [facility])    Fluid overload 11/24/2017   GERD (gastroesophageal reflux disease)    Gout 10/23/2015   H/O tricuspid valve repair 2012   History of echocardiogram    Echo at [facility] 4/12: mod LVH, EF 60-65%, mod LAE, tissue MVR ok, mod TR, mod pulmo HTN with RVSP 48 mmHg   HLD (hyperlipidemia) 10/05/2016   Hyperkalemia 11/24/2017   Hypertension    Mitral valve disease    Obstructive sleep apnea    Pancreatitis, ruled out 02/25/2016   Paroxysmal atrial fibrillation ([place])    s/p Maze procedure at time of MVR in 2011   Permanent atrial fibrillation 02/25/2016   Pleural effusion    Polypharmacy 11/23/2015   Pulmonary HTN ([place])    S/P mitral valve replacement with porcine valve 2012   Septic shock ([place])    Syncope and collapse 08/10/2017   Thrombocytopenia ([place])    Viral gastroenteritis 02/27/2016    Past Surgical History: Past Surgical History:  Procedure Laterality Date   AV FISTULA PLACEMENT Left 07/13/2017   Procedure: LEFT ARM ARTERIOVENOUS (AV) FISTULA CREATION;  Surgeon: Waynetta Sandy, MD;  Location: [place];   Service: Vascular;  Laterality: Left;   [place] Left 11/03/2017   Procedure: BASILIC VEIN TRANSPOSITION SECOND STAGE;  Surgeon: Waynetta Sandy, MD;  Location: [place];  Service: Vascular;  Laterality: Left;   [place]   CARDIOVERSION N/A 07/09/2017   Procedure: CARDIOVERSION;  Surgeon: Larey Dresser, MD;  Location: [facility] ENDOSCOPY;  Service: Cardiovascular;  Laterality: N/A;   COLONOSCOPY N/A 07/16/2017   Procedure: COLONOSCOPY;  Surgeon: Jerene Bears, MD;  Location: [place];  Service: Gastroenterology;  Laterality: N/A;   ESOPHAGOGASTRODUODENOSCOPY N/A 07/16/2017   Procedure: ESOPHAGOGASTRODUODENOSCOPY (EGD);  Surgeon: Jerene Bears, MD;  Location: [facility] ENDOSCOPY;  Service: Gastroenterology;  Laterality: N/A;   INSERTION OF DIALYSIS CATHETER Right 07/13/2017   Procedure: INSERTION OF DIALYSIS CATHETER RIGHT INTERNAL JUGULAR;  Surgeon: Waynetta Sandy, MD;  Location: [place];  Service: Vascular;  Laterality: Right;   RIGHT HEART CATH N/A 06/29/2017   Procedure: RIGHT HEART CATH;  Surgeon: Larey Dresser, MD;  Location: [place] CV LAB;  Service: Cardiovascular;  Laterality: N/A;   RIGHT/LEFT HEART CATH AND CORONARY ANGIOGRAPHY N/A 08/19/2017   Procedure: RIGHT/LEFT HEART CATH AND CORONARY ANGIOGRAPHY;  Surgeon: Leonie Man, MD;  Location: [place] CV LAB;  Service: Cardiovascular;  Laterality: N/A;   TEE WITHOUT CARDIOVERSION N/A 07/09/2017   Procedure: TRANSESOPHAGEAL ECHOCARDIOGRAM (TEE);  Surgeon: Larey Dresser, MD;  Location: [facility] ENDOSCOPY;  Service: Cardiovascular;  Laterality: N/A;    Family History: Family History  Problem Relation Age of Onset   Heart attack Neg Hx     Social History: Social History   Socioeconomic History   Marital status: Single    Spouse name: Not on file   Number of children: Not on file   Years of education: Not on file   Highest education level:  Not on file  Occupational History    Comment: on disability  Social Needs   Financial resource strain: Not on file   Food insecurity:    Worry: Not on file    Inability: Not on file   Transportation needs:    Medical: Not on file    Non-medical: Not on file  Tobacco Use  Smoking status: Former Smoker    Packs/day: 1.00    Types: Cigarettes   Smokeless tobacco: Never Used  Substance and Sexual Activity   Alcohol use: No   Drug use: No   Sexual activity: Not Currently  Lifestyle   Physical activity:    Days per week: Not on file    Minutes per session: Not on file   Stress: Not on file  Relationships   Social connections:    Talks on phone: Not on file    Gets together: Not on file    Attends religious service: Not on file    Active member of club or organization: Not on file    Attends meetings of clubs or organizations: Not on file    Relationship status: Not on file  Other Topics Concern   Not on file  Social History Narrative   Not on file    Allergies:  Allergies  Allergen Reactions   Triamcinolone Other (See Comments)    Felt like skin was burning    Objective:    Vital Signs:   Temp:  [98 F (36.7 C)] 98 F (36.7 C) (04/11 1230) Pulse Rate:  [69-117] 117 (04/11 1929) Resp:  [17-25] 17 (04/11 1929) BP: (97-205)/(69-128) 113/97 (04/11 2000) SpO2:  [88 %-99 %] 96 % (04/11 1929)    Weight change: There were no vitals filed for this visit.  Intake/Output:  No intake or output data in the 24 hours ending 02/26/19 2143    Physical Exam    Constitutional:      General: Confused, rolling around in bed, moaning and yelling.  HEENT:  [place]/AT, eyes closed, mask over mouth, dried blood near mouth Neck:      Normal range of motion and neck supple.  Cardiovascular:     Rate and Rhythm: Irregular, tachycardic with soft systolic murmur Pulmonary:     Scattered rhonchi, decreased breath sounds on right Abdominal:     Appears to have mild  TTP diffusely Musculoskeletal:     Moves all extemities but holding left wrist Skin:    General: Skin is warm and dry.     Comments: Petechial rash noted over the arms abdomen and lower extremities. Trace edema. Neurological:     Mental Status: Not oriented, confused, unable to fully asses   EKG    ECGs show both baseline afib with RBBB, LAFB and one with wide complex tachycardia likely VT  Labs   Basic Metabolic Panel: Recent Labs  Lab 02/26/19 1253  NA 131*  K 4.5  CL 89*  CO2 19*  GLUCOSE 67*  BUN 71*  CREATININE 12.17*  CALCIUM 9.5    Liver Function Tests: Recent Labs  Lab 02/26/19 1253  AST 46*  ALT 20  ALKPHOS 146*  BILITOT 1.5*  PROT 8.0  ALBUMIN 3.3*   No results for input(s): LIPASE, AMYLASE in the last 168 hours. No results for input(s): AMMONIA in the last 168 hours.  CBC: Recent Labs  Lab 02/26/19 1253  WBC 13.7*  NEUTROABS 12.1*  HGB 9.7*  HCT 28.5*  MCV 80.3  PLT 213    Cardiac Enzymes: No results for input(s): CKTOTAL, CKMB, CKMBINDEX, TROPONINI in the last 168 hours.  BNP: BNP (last 3 results) No results for input(s): BNP in the last 8760 hours.  ProBNP (last 3 results) No results for input(s): PROBNP in the last 8760 hours.   CBG: Recent Labs  Lab 02/26/19 1446 02/26/19 1506 02/26/19 1619  GLUCAP 45* 160* 69*  Coagulation Studies: Recent Labs    02/26/19 1253  LABPROT 38.9*  INR 4.1*     Imaging   Ct Abdomen Pelvis Wo Contrast  Result Date: 02/26/2019 CLINICAL DATA:  Abdominal pain, acute. EXAM: CT ABDOMEN AND PELVIS WITHOUT CONTRAST TECHNIQUE: Multidetector CT imaging of the abdomen and pelvis was performed following the standard protocol without IV contrast. COMPARISON:  CT abdomen dated 07/13/2017. FINDINGS: Lower chest: RIGHT pleural effusion, at least moderate in size, incompletely imaged. Rounded consolidation within the RIGHT lower lobe, atelectasis versus pneumonia. LEFT lung base is clear.  Cardiomegaly, incompletely imaged. Hepatobiliary: Cirrhotic appearing liver. Cholelithiasis without evidence of acute cholecystitis. No bile duct dilatation seen. Pancreas: Unremarkable. No pancreatic ductal dilatation or surrounding inflammatory changes. Spleen: Mild splenomegaly. Otherwise unremarkable. Adrenals/Urinary Tract: Staghorn calculus within the LEFT renal pelvis. No associated hydronephrosis. RIGHT kidney is unremarkable without stone or hydronephrosis. Bladder is unremarkable, partially decompressed. Stomach/Bowel: No dilated large or small bowel loops. Characterization of the bowel is limited by patient motion artifact, however, there is no pericolonic fluid or other secondary signs of active bowel wall inflammation. Moderate amount of gas and stool within the colon. Moderate amount of gas and fluid within the nondistended stomach. Vascular/Lymphatic: Aortic atherosclerosis. No enlarged lymph nodes seen. Reproductive: Prostate is unremarkable. Other: No free fluid or abscess collection seen. No free intraperitoneal air seen. Musculoskeletal: No acute or suspicious osseous finding. Chronic appearing compression deformities of the T11, L3 and L4 vertebral bodies. IMPRESSION: 1. New RIGHT pleural effusion, at least moderate in size, incompletely imaged. New rounded consolidation within the right lower lobe, atelectasis versus pneumonia. 2. No acute findings within the abdomen or pelvis. No bowel obstruction or evidence of bowel wall inflammation. No evidence of acute solid organ abnormality. No free fluid or abscess collection seen. No free intraperitoneal air. 3. Cirrhotic appearing liver. 4. Cholelithiasis without evidence of acute cholecystitis. 5. Staghorn calculus within the left renal pelvis. No associated hydronephrosis. 6. Additional chronic/incidental findings detailed above. Aortic Atherosclerosis (ICD10-I70.0). Electronically Signed   By: Franki Cabot M.D.   On: 02/26/2019 21:05   Dg Wrist  2 Views Left  Result Date: 02/26/2019 CLINICAL DATA:  58 year old who fell and injured the LEFT wrist approximately 1 month ago. Persistent pain. Subsequent encounter. EXAM: LEFT WRIST - 2 VIEW COMPARISON:  02/03/2019. FINDINGS: Subacute comminuted and impacted intra-articular fracture involving the distal radius into multiple fragments, unchanged from the 02/03/2019 examination with the exception of perhaps slightly more impaction currently. Stable comminuted fracture involving the distal ulna, including the ulnar styloid. No new interval fractures. Radiocarpal joint space narrowing. Possible VISI deformity of the wrist. IMPRESSION: 1. Subacute comminuted and impacted intra-articular fracture involving the distal radius into multiple fragments, unchanged since the 02/03/19 examination with the exception of perhaps slightly more impaction currently. 2. Stable comminuted fracture involving the distal ulna, including the ulnar styloid. 3. No new interval fractures. 4. Possible VISI deformity of the wrist. Electronically Signed   By: Evangeline Dakin M.D.   On: 02/26/2019 13:45   Ct Head Wo Contrast  Result Date: 02/26/2019 CLINICAL DATA:  Altered mental status. EXAM: CT HEAD WITHOUT CONTRAST TECHNIQUE: Contiguous axial images were obtained from the base of the skull through the vertex without intravenous contrast. COMPARISON:  CT scan of February 03, 2019. FINDINGS: Brain: No evidence of acute infarction, hemorrhage, hydrocephalus, extra-axial collection or mass lesion/mass effect. Vascular: No hyperdense vessel or unexpected calcification. Skull: Normal. Negative for fracture or focal lesion. Sinuses/Orbits: No acute finding. Other: None. IMPRESSION:  No acute intracranial abnormality seen. Electronically Signed   By: Marijo Conception, M.D.   On: 02/26/2019 16:38   Dg Chest Port 1 View  Result Date: 02/26/2019 CLINICAL DATA:  Tachycardia EXAM: PORTABLE CHEST 1 VIEW COMPARISON:  02/03/2019 FINDINGS: Moderate  cardiomegaly. Vascular congestion. Stable right pleural effusion and right basilar opacities. Minimal subsegmental atelectasis at the left base. Upper lungs clear. No pneumothorax. IMPRESSION: Stable vascular congestion. Stable right pleural effusion and basilar pulmonary opacities. Electronically Signed   By: Marybelle Killings M.D.   On: 02/26/2019 13:24      Medications:     Current Medications:  amiodarone  150 mg Intravenous Once   [START ON 02/27/2019] calcium acetate  667 mg Oral TID WC   [START ON 02/27/2019] Chlorhexidine Gluconate Cloth  6 each Topical Q0600   [START ON 02/28/2019] doxercalciferol  1 mcg Intravenous Q M,W,F-HD   fluticasone  2 spray Each Nare Daily   folic acid  1 mg Oral Daily   loratadine  10 mg Oral Daily   metoprolol tartrate  12.5 mg Oral BID   sodium chloride flush  3 mL Intravenous Q12H     Infusions:  sodium chloride     sodium chloride     amiodarone     Followed by   Derrill Memo ON 02/27/2019] amiodarone          Assessment/Plan  Idriss Guandique is a 58 y.o. male with persistent atrial fibrillation, mitral valve disease status post bioprosthetic mitral valve replacement in 2012 at [facility] in [place], tricuspid valve repair, CAD with known RPDA CTO, ESRD on dialysis, OSA, diabetes, HTN, HL, CHF, pulmonary hypertension, anemia, poor adherence with medications  He is admitted with confusion, oral bleeding, supratherapeutic INR, possible sepsis  Consult for wide complex tachycardia Consistent with VT, resolved spontaneously May be due to underlying structural heart disease and CAD. Has known RPDA CTO. K and Mg are within normal limits although appears somewhat acidemic.  Start amiodarone gtt and continue low dose metoprolol from home regimen. Agree with dialysis to manage electrolytes and uremia. INR supratherapeutic. Can hold warfarin for now and monitor INR. If has additional wide complex tachycardia and becomes hemodynamically unstable,  can defibrillate. Agree with blood cultures. With oral bleeding and history of bioprosthetic mitral valve, higher risk for endocarditis. Check new transthoracic echo. Ok to trend troponins and ECG. Cont aspirin, statin for history of CAD. Additional recommendations based on clinical progress and testing.  Medication concerns reviewed with patient and pharmacy team. Barriers identified: Language and confusion  Length of Stay: 0  Tressie Stalker, MD  02/26/2019, 9:43 PM  Cardiology  Thank you for the consultation

## 2019-02-26 NOTE — ED Notes (Signed)
ED TO INPATIENT HANDOFF REPORT  ED Nurse Name and Phone #: [phone]  S Name/Age/Gender Michael [name] 58 y.o. male Room/Bed: 022C/022C  Code Status   Code Status: Full Code  Home/SNF/Other Home Patient oriented to: self Is this baseline? No   Triage Complete: Triage complete  Chief Complaint Oral Bleeding  Triage Note Pt BIB [facility]. Per EMS pt presented with oral/nasal bleeding. EMS reports that family stated they haven't seen pt since yesterday. Pt is a dialysis patient and has not been to dialysis since Monday. Per EMS pt recently fractured his left wrist and has a splint in place for that. Pt also has a generalized petechia rash. Pt presents to the ED with dried blood on lips and in mouth. Pt unable to speak English. Daughter is [name] [phone].    Allergies Allergies  Allergen Reactions  . Triamcinolone Other (See Comments)    Felt like skin was burning    Level of Care/Admitting Diagnosis ED Disposition    ED Disposition Condition [facility]: [place] [[id number]]  Level of Care: Progressive [102]  Diagnosis: Metabolic encephalopathy [563.14.ICD-9-CM]  Admitting Physician: Patrecia Pour, EDWIN [[id number]]  Attending Physician: Patrecia Pour, EDWIN [phone]  Estimated length of stay: past midnight tomorrow  Certification:: I certify this patient will need inpatient services for at least 2 midnights  PT Class (Do Not Modify): Inpatient [101]  PT Acc Code (Do Not Modify): Private [1]       B Medical/Surgery History Past Medical History:  Diagnosis Date  . Abdominal pain 02/25/2016  . Acute encephalopathy 08/06/2018  . Acute hypoxemic respiratory failure ([place]) 12/29/2015  . Advance care planning   . Anemia - mild exacerbation 10/15/2015  . Anemia of chronic disease   . Aortic regurgitation 02/27/2016  . Asthma    said he does not know  . Atrial fibrillation with RVR ([place]) 02/19/2016  . Calf pain 09/10/2015  .  Chronic kidney disease (CKD), stage IV (severe) ([place])   . Chronic systolic CHF (congestive heart failure) (HCC)    a. prior EF normal; b. Echo 10/16: Mild LVH, EF 30-35%, mitral valve bioprosthesis present without evidence of stenosis or regurgitation, severe LAE, mild RVE with mild to moderately reduced RVSF, moderate RAE  . Cirrhosis ([place])   . Congestive heart failure (CHF) ([place]) 02/25/2016  . Coronary artery disease   . Diabetes mellitus type 2 in obese ([place])   . Diabetes mellitus without complication ([place])   . Dilated cardiomyopathy ([place]) 12/24/2015  . Dyspnea   . Elevated troponin 11/24/2017  . ESRD (end stage renal disease) on dialysis ([place])   . Fluid overload 11/24/2017  . GERD (gastroesophageal reflux disease)   . Gout 10/23/2015  . H/O tricuspid valve repair 2012  . History of echocardiogram    Echo at [facility] 4/12: mod LVH, EF 60-65%, mod LAE, tissue MVR ok, mod TR, mod pulmo HTN with RVSP 48 mmHg  . HLD (hyperlipidemia) 10/05/2016  . Hyperkalemia 11/24/2017  . Hypertension   . Mitral valve disease   . Obstructive sleep apnea   . Pancreatitis, ruled out 02/25/2016  . Paroxysmal atrial fibrillation (HCC)    s/p Maze procedure at time of MVR in 2011  . Permanent atrial fibrillation 02/25/2016  . Pleural effusion   . Polypharmacy 11/23/2015  . Pulmonary HTN ([place])   . 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 Septic shock ([place])   . Syncope and collapse 08/10/2017  .  Thrombocytopenia ([place])   . Viral gastroenteritis 02/27/2016   Past Surgical History:  Procedure Laterality Date  . AV FISTULA PLACEMENT Left 07/13/2017   Procedure: LEFT ARM ARTERIOVENOUS (AV) FISTULA CREATION;  Surgeon: Waynetta Sandy, MD;  Location: [place];  Service: Vascular;  Laterality: Left;  . BASCILIC VEIN TRANSPOSITION Left 11/03/2017   Procedure: BASILIC VEIN TRANSPOSITION SECOND STAGE;  Surgeon: Waynetta Sandy, MD;  Location: [place];  Service: Vascular;  Laterality: Left;   . CARDIAC SURGERY    . CARDIAC VALVE REPLACEMENT  2011  . CARDIOVERSION N/A 07/09/2017   Procedure: CARDIOVERSION;  Surgeon: Larey Dresser, MD;  Location[facility] ENDOSCOPY;  Service: Cardiovascular;  Laterality: N/A;  . COLONOSCOPY N/A 07/16/2017   Procedure: COLONOSCOPY;  Surgeon: Jerene Bears, MD;  Location: [place];  Service: Gastroenterology;  Laterality: N/A;  . ESOPHAGOGASTRODUODENOSCOPY N/A 07/16/2017   Procedure: ESOPHAGOGASTRODUODENOSCOPY (EGD);  Surgeon: Jerene Bears, MD;  Location[facility] ENDOSCOPY;  Service: Gastroenterology;  Laterality: N/A;  . INSERTION OF DIALYSIS CATHETER Right 07/13/2017   Procedure: INSERTION OF DIALYSIS CATHETER RIGHT INTERNAL JUGULAR;  Surgeon: Waynetta Sandy, MD;  Location: [place];  Service: Vascular;  Laterality: Right;  . RIGHT HEART CATH N/A 06/29/2017   Procedure: RIGHT HEART CATH;  Surgeon: Larey Dresser, MD;  Location: [place] CV LAB;  Service: Cardiovascular;  Laterality: N/A;  . RIGHT/LEFT HEART CATH AND CORONARY ANGIOGRAPHY N/A 08/19/2017   Procedure: RIGHT/LEFT HEART CATH AND CORONARY ANGIOGRAPHY;  Surgeon: Leonie Man, MD;  Location: [place] CV LAB;  Service: Cardiovascular;  Laterality: N/A;  . TEE WITHOUT CARDIOVERSION N/A 07/09/2017   Procedure: TRANSESOPHAGEAL ECHOCARDIOGRAM (TEE);  Surgeon: Larey Dresser, MD;  Location: [facility] ENDOSCOPY;  Service: Cardiovascular;  Laterality: N/A;     A IV Location/Drains/Wounds Patient Lines/Drains/Airways Status   Active Line/Drains/Airways    Name:   Placement date:   Placement time:   Site:   Days:   Peripheral IV 02/26/19 Right Antecubital   02/26/19    1300    Antecubital   less than 1   Fistula / Graft Left Upper arm Arteriovenous fistula   11/03/17    1116    Upper arm   480   Incision (Closed) 11/03/17 Arm Left   11/03/17    1024     480          Intake/Output Last 24 hours No intake or output data in the 24 hours ending 02/26/19 2228  Labs/Imaging Results for  orders placed or performed during the hospital encounter of 02/26/19 (from the past 48 hour(s))  Comprehensive metabolic panel     Status: Abnormal   Collection Time: 02/26/19 12:53 PM  Result Value Ref Range   Sodium 131 (L) 135 - 145 mmol/L   Potassium 4.5 3.5 - 5.1 mmol/L   Chloride 89 (L) 98 - 111 mmol/L   CO2 19 (L) 22 - 32 mmol/L   Glucose, Bld 67 (L) 70 - 99 mg/dL   BUN 71 (H) 6 - 20 mg/dL   Creatinine, Ser 12.17 (H) 0.61 - 1.24 mg/dL   Calcium 9.5 8.9 - 10.3 mg/dL   Total Protein 8.0 6.5 - 8.1 g/dL   Albumin 3.3 (L) 3.5 - 5.0 g/dL   AST 46 (H) 15 - 41 U/L   ALT 20 0 - 44 U/L   Alkaline Phosphatase 146 (H) 38 - 126 U/L   Total Bilirubin 1.5 (H) 0.3 - 1.2 mg/dL   GFR calc non Af  Amer 4 (L) >60 mL/min   GFR calc Af Amer 5 (L) >60 mL/min   Anion gap 23 (H) 5 - 15    Comment: Performed at [facility] [address], [place], [place] [address]  CBC with Differential     Status: Abnormal   Collection Time: 02/26/19 12:53 PM  Result Value Ref Range   WBC 13.7 (H) 4.0 - 10.5 K/uL   RBC 3.55 (L) 4.22 - 5.81 MIL/uL   Hemoglobin 9.7 (L) 13.0 - 17.0 g/dL   HCT 28.5 (L) 39.0 - 52.0 %   MCV 80.3 80.0 - 100.0 fL   MCH 27.3 26.0 - 34.0 pg   MCHC 34.0 30.0 - 36.0 g/dL   RDW 15.9 (H) 11.5 - 15.5 %   Platelets 213 150 - 400 K/uL   nRBC 0.0 0.0 - 0.2 %   Neutrophils Relative % 89 %   Neutro Abs 12.1 (H) 1.7 - 7.7 K/uL   Lymphocytes Relative 5 %   Lymphs Abs 0.7 0.7 - 4.0 K/uL   Monocytes Relative 6 %   Monocytes Absolute 0.8 0.1 - 1.0 K/uL   Eosinophils Relative 0 %   Eosinophils Absolute 0.0 0.0 - 0.5 K/uL   Basophils Relative 0 %   Basophils Absolute 0.0 0.0 - 0.1 K/uL   Immature Granulocytes 0 %   Abs Immature Granulocytes 0.06 0.00 - 0.07 K/uL    Comment: Performed at [facility], [place] [address] [place], [place] [address]  Protime-INR     Status: Abnormal   Collection Time: 02/26/19 12:53 PM  Result Value Ref Range   Prothrombin Time 38.9 (H) 11.4 - 15.2  seconds   INR 4.1 (HH) 0.8 - 1.2    Comment: REPEATED TO VERIFY CRITICAL RESULT CALLED TO, READ BACK BY AND VERIFIED WITH: [email or web address], N RN @ 8502 ON 02/26/2019 BY TEMOCHE, H Performed at [facility], [place] [address], [place], [place] [address]   Type and screen [place]     Status: None   Collection Time: 02/26/19 12:53 PM  Result Value Ref Range   ABO/RH(D) B POS    Antibody Screen NEG    Sample Expiration      03/01/2019 Performed at [facility], [place] [address], [place], [place] [address]   CBG monitoring, ED     Status: Abnormal   Collection Time: 02/26/19  2:46 PM  Result Value Ref Range   Glucose-Capillary 45 (L) 70 - 99 mg/dL  CBG monitoring, ED (now and then every hour for 3 hours)     Status: Abnormal   Collection Time: 02/26/19  3:06 PM  Result Value Ref Range   Glucose-Capillary 160 (H) 70 - 99 mg/dL  CBG monitoring, ED (now and then every hour for 3 hours)     Status: Abnormal   Collection Time: 02/26/19  4:19 PM  Result Value Ref Range   Glucose-Capillary 69 (L) 70 - 99 mg/dL  Troponin I - Now Then Q6H     Status: Abnormal   Collection Time: 02/26/19  9:16 PM  Result Value Ref Range   Troponin I 0.04 (HH) <0.03 ng/mL    Comment: CRITICAL RESULT CALLED TO, READ BACK BY AND VERIFIED WITH: KENNEDY R,RN 02/26/19 2219 WAYK Performed at [facility], [address] [place], [place] [address]   Basic metabolic panel     Status: Abnormal   Collection Time: 02/26/19  9:16 PM  Result Value  Ref Range   Sodium 131 (L) 135 - 145 mmol/L   Potassium 4.8 3.5 - 5.1 mmol/L   Chloride 92 (L) 98 - 111 mmol/L   CO2 20 (L) 22 - 32 mmol/L   Glucose, Bld 70 70 - 99 mg/dL   BUN 77 (H) 6 - 20 mg/dL   Creatinine, Ser 12.06 (H) 0.61 - 1.24 mg/dL   Calcium 9.1 8.9 - 10.3 mg/dL   GFR calc non Af Amer 4 (L) >60 mL/min   GFR calc Af Amer 5 (L) >60 mL/min   Anion gap 19 (H) 5 - 15    Comment: Performed at [facility] [address],  [place], [place] [address]  Magnesium     Status: None   Collection Time: 02/26/19  9:16 PM  Result Value Ref Range   Magnesium 2.2 1.7 - 2.4 mg/dL    Comment: Performed at [facility] [address] [place], [place] [address]   Ct Abdomen Pelvis Wo Contrast  Result Date: 02/26/2019 CLINICAL DATA:  Abdominal pain, acute. EXAM: CT ABDOMEN AND PELVIS WITHOUT CONTRAST TECHNIQUE: Multidetector CT imaging of the abdomen and pelvis was performed following the standard protocol without IV contrast. COMPARISON:  CT abdomen dated 07/13/2017. FINDINGS: Lower chest: RIGHT pleural effusion, at least moderate in size, incompletely imaged. Rounded consolidation within the RIGHT lower lobe, atelectasis versus pneumonia. LEFT lung base is clear. Cardiomegaly, incompletely imaged. Hepatobiliary: Cirrhotic appearing liver. Cholelithiasis without evidence of acute cholecystitis. No bile duct dilatation seen. Pancreas: Unremarkable. No pancreatic ductal dilatation or surrounding inflammatory changes. Spleen: Mild splenomegaly. Otherwise unremarkable. Adrenals/Urinary Tract: Staghorn calculus within the LEFT renal pelvis. No associated hydronephrosis. RIGHT kidney is unremarkable without stone or hydronephrosis. Bladder is unremarkable, partially decompressed. Stomach/Bowel: No dilated large or small bowel loops. Characterization of the bowel is limited by patient motion artifact, however, there is no pericolonic fluid or other secondary signs of active bowel wall inflammation. Moderate amount of gas and stool within the colon. Moderate amount of gas and fluid within the nondistended stomach. Vascular/Lymphatic: Aortic atherosclerosis. No enlarged lymph nodes seen. Reproductive: Prostate is unremarkable. Other: No free fluid or abscess collection seen. No free intraperitoneal air seen. Musculoskeletal: No acute or suspicious osseous finding. Chronic appearing compression deformities of the T11, L3 and L4 vertebral bodies.  IMPRESSION: 1. New RIGHT pleural effusion, at least moderate in size, incompletely imaged. New rounded consolidation within the right lower lobe, atelectasis versus pneumonia. 2. No acute findings within the abdomen or pelvis. No bowel obstruction or evidence of bowel wall inflammation. No evidence of acute solid organ abnormality. No free fluid or abscess collection seen. No free intraperitoneal air. 3. Cirrhotic appearing liver. 4. Cholelithiasis without evidence of acute cholecystitis. 5. Staghorn calculus within the left renal pelvis. No associated hydronephrosis. 6. Additional chronic/incidental findings detailed above. Aortic Atherosclerosis (ICD10-I70.0). Electronically Signed   By: Franki Cabot M.D.   On: 02/26/2019 21:05   Dg Wrist 2 Views Left  Result Date: 02/26/2019 CLINICAL DATA:  58 year old who fell and injured the LEFT wrist approximately 1 month ago. Persistent pain. Subsequent encounter. EXAM: LEFT WRIST - 2 VIEW COMPARISON:  02/03/2019. FINDINGS: Subacute comminuted and impacted intra-articular fracture involving the distal radius into multiple fragments, unchanged from the 02/03/2019 examination with the exception of perhaps slightly more impaction currently. Stable comminuted fracture involving the distal ulna, including the ulnar styloid. No new interval fractures. Radiocarpal joint space narrowing. Possible VISI deformity of the wrist. IMPRESSION: 1. Subacute comminuted and  impacted intra-articular fracture involving the distal radius into multiple fragments, unchanged since the 02/03/19 examination with the exception of perhaps slightly more impaction currently. 2. Stable comminuted fracture involving the distal ulna, including the ulnar styloid. 3. No new interval fractures. 4. Possible VISI deformity of the wrist. Electronically Signed   By: Evangeline Dakin M.D.   On: 02/26/2019 13:45   Ct Head Wo Contrast  Result Date: 02/26/2019 CLINICAL DATA:  Altered mental status. EXAM: CT  HEAD WITHOUT CONTRAST TECHNIQUE: Contiguous axial images were obtained from the base of the skull through the vertex without intravenous contrast. COMPARISON:  CT scan of February 03, 2019. FINDINGS: Brain: No evidence of acute infarction, hemorrhage, hydrocephalus, extra-axial collection or mass lesion/mass effect. Vascular: No hyperdense vessel or unexpected calcification. Skull: Normal. Negative for fracture or focal lesion. Sinuses/Orbits: No acute finding. Other: None. IMPRESSION: No acute intracranial abnormality seen. Electronically Signed   By: Marijo Conception, M.D.   On: 02/26/2019 16:38   Dg Chest Port 1 View  Result Date: 02/26/2019 CLINICAL DATA:  Tachycardia EXAM: PORTABLE CHEST 1 VIEW COMPARISON:  02/03/2019 FINDINGS: Moderate cardiomegaly. Vascular congestion. Stable right pleural effusion and right basilar opacities. Minimal subsegmental atelectasis at the left base. Upper lungs clear. No pneumothorax. IMPRESSION: Stable vascular congestion. Stable right pleural effusion and basilar pulmonary opacities. Electronically Signed   By: Marybelle Killings M.D.   On: 02/26/2019 13:24    Pending Labs Unresulted Labs (From admission, onward)    Start     Ordered   02/27/19 0500  Protime-INR  Tomorrow morning,   R     02/26/19 1836   02/27/19 0500  CBC  Tomorrow morning,   R     02/26/19 1836   02/27/19 0500  APTT  Tomorrow morning,   R     02/26/19 1836   02/27/19 0500  Comprehensive metabolic panel  Tomorrow morning,   R     02/26/19 1836   02/26/19 2040  Troponin I - Now Then Q6H  Now then every 6 hours,   R     02/26/19 2039   02/26/19 1858  Urine drugs of abuse scrn w alc, routine (Ref Lab)  Once,   R     02/26/19 1857   02/26/19 1857  Culture, blood (Routine X 2) w Reflex to ID Panel  BLOOD CULTURE X 2,   R     02/26/19 1856   02/26/19 1834  Culture, Urine  Once,   R     02/26/19 1836   02/26/19 1628  Rapid urine drug screen (hospital performed)  ONCE - STAT,   R     02/26/19 1627    02/26/19 1244  Urinalysis, Routine w reflex microscopic  ONCE - STAT,   STAT     02/26/19 1246          Vitals/Pain Today's Vitals   02/26/19 2100 02/26/19 2115 02/26/19 2145 02/26/19 2200  BP: (!) 88/60 100/71 (!) 144/94 (!) 135/119  Pulse:    (!) 44  Resp: 19 19 20 19   Temp:      TempSrc:      SpO2:    94%    Isolation Precautions No active isolations  Medications Medications  sodium chloride flush (NS) 0.9 % injection 3 mL (3 mLs Intravenous Given 02/26/19 1453)  sodium chloride flush (NS) 0.9 % injection 3 mL (has no administration in time range)  Chlorhexidine Gluconate Cloth 2 % PADS 6 each (has no administration in time  range)  pentafluoroprop-tetrafluoroeth (GEBAUERS) aerosol 1 application (has no administration in time range)  lidocaine (PF) (XYLOCAINE) 1 % injection 5 mL (has no administration in time range)  lidocaine-prilocaine (EMLA) cream 1 application (has no administration in time range)  0.9 %  sodium chloride infusion (has no administration in time range)  0.9 %  sodium chloride infusion (has no administration in time range)  alteplase (CATHFLO ACTIVASE) injection 2 mg (has no administration in time range)  doxercalciferol (HECTOROL) injection 1 mcg (has no administration in time range)  metoprolol tartrate (LOPRESSOR) tablet 12.5 mg (has no administration in time range)  calcium acetate (PHOSLO) capsule 667 mg (has no administration in time range)  folic acid (FOLVITE) tablet 1 mg (has no administration in time range)  baclofen (LIORESAL) tablet 5 mg (has no administration in time range)  loratadine (CLARITIN) tablet 10 mg (has no administration in time range)  fluticasone (FLONASE) 50 MCG/ACT nasal spray 2 spray (has no administration in time range)  lidocaine (XYLOCAINE) 2 % viscous mouth solution 15 mL (has no administration in time range)  oxyCODONE (Oxy IR/ROXICODONE) immediate release tablet 5 mg (has no administration in time range)  ondansetron  (ZOFRAN) tablet 4 mg (has no administration in time range)    Or  ondansetron (ZOFRAN) injection 4 mg (has no administration in time range)  hydrALAZINE (APRESOLINE) injection 5 mg (has no administration in time range)  amiodarone (NEXTERONE) 1.8 mg/mL load via infusion 150 mg (150 mg Intravenous Bolus from Bag 02/26/19 2139)    Followed by  amiodarone (NEXTERONE PREMIX) 360-4.14 MG/200ML-% (1.8 mg/mL) IV infusion (60 mg/hr Intravenous New Bag/Given 02/26/19 2150)    Followed by  amiodarone (NEXTERONE PREMIX) 360-4.14 MG/200ML-% (1.8 mg/mL) IV infusion (has no administration in time range)  fentaNYL (SUBLIMAZE) injection 50 mcg (50 mcg Intravenous Given 02/26/19 1304)  ondansetron (ZOFRAN) injection 4 mg (4 mg Intravenous Given 02/26/19 1304)  dextrose 50 % solution 50 mL (50 mLs Intravenous Given 02/26/19 1453)  HYDROmorphone (DILAUDID) injection 0.5 mg (0.5 mg Intravenous Given 02/26/19 1557)  dextrose 50 % solution 50 mL (50 mLs Intravenous Given 02/26/19 1638)    Mobility walks with person assist High fall risk   Focused Assessments    R Recommendations: See Admitting Provider Note  Report given to:   Additional Notes:  Patient went from normal sinus to sustained v-tach. He is now on an amiodarone drip.

## 2019-02-26 NOTE — ED Triage Notes (Signed)
Pt BIB [facility]. Per EMS pt presented with oral/nasal bleeding. EMS reports that family stated they haven't seen pt since yesterday. Pt is a dialysis patient and has not been to dialysis since Monday. Per EMS pt recently fractured his left wrist and has a splint in place for that. Pt also has a generalized petechia rash. Pt presents to the ED with dried blood on lips and in mouth. Pt unable to speak English. Daughter is [name] [phone].

## 2019-02-26 NOTE — Progress Notes (Addendum)
Mr. Seldon is a 32 yom with ESRD, a fib on coumadin, CAD, MVR, DM, and cirrhosis, p/w encephalopathy. He had negative head CT, EKG with a fib and RBBB, INR 4.1 with some oral bleeding. Nephrology was consulted, planning to dialyze, and he was admitted by the hospitalists for encephalopathy suspected secondary to polypharmacy or metabolic.   He was noted to become tachycardic while still in ED, looks like runs of v tach on monitor. EKG repeated with marked ST abnormalities, possilbly ventricular tachycardia. Patient unable to provide history d/t clinical condition.   He is restless in bed, moaning. HR ~130 and regular. SBP 90's, RR 18, sat mid 90's. Diffuse petechial rash. Abd soft. No meningismus. No LE edema.   Stat chemistries, troponin and EKG ordered.   Discussed with cardiology, their care much appreciated.   Plan for admission to progressive, repeat chem panel now, start amiodarone with bolus and infusion, keep pads in place and shock if recurrent v tach with hypotension, trend troponin, echo in am.   ADDENDUM: 10:20 pm. Appreciate PCCM evaluating patient. They consider him to be hemodynamically stable and see no role for PCCM at this time.

## 2019-02-26 NOTE — Progress Notes (Signed)
Orthopedic Tech Progress Note Patient Details:  Michael [name] 10-18-1961 [id number] Went to find out if the DR wanted velcro or fiberglass splint to be applied to patient. Asked RN to assist me. Patient does not speak english and was very upset/in pain. Ortho Devices Type of Ortho Device: Short arm splint Ortho Device/Splint Location: ULE Ortho Device/Splint Interventions: Adjustment, Application, Ordered   Post Interventions Patient Tolerated: Poor Instructions Provided: Care of device, Adjustment of device   Janit Pagan 02/26/2019, 3:35 PM

## 2019-02-27 ENCOUNTER — Inpatient Hospital Stay (HOSPITAL_COMMUNITY): Payer: Medicare Other

## 2019-02-27 DIAGNOSIS — M25539 Pain in unspecified wrist: Secondary | ICD-10-CM

## 2019-02-27 DIAGNOSIS — R9431 Abnormal electrocardiogram [ECG] [EKG]: Secondary | ICD-10-CM

## 2019-02-27 DIAGNOSIS — R21 Rash and other nonspecific skin eruption: Secondary | ICD-10-CM

## 2019-02-27 DIAGNOSIS — E162 Hypoglycemia, unspecified: Secondary | ICD-10-CM

## 2019-02-27 DIAGNOSIS — Z992 Dependence on renal dialysis: Secondary | ICD-10-CM

## 2019-02-27 DIAGNOSIS — B955 Unspecified streptococcus as the cause of diseases classified elsewhere: Secondary | ICD-10-CM

## 2019-02-27 DIAGNOSIS — B9561 Methicillin susceptible Staphylococcus aureus infection as the cause of diseases classified elsewhere: Secondary | ICD-10-CM

## 2019-02-27 DIAGNOSIS — I472 Ventricular tachycardia: Secondary | ICD-10-CM

## 2019-02-27 DIAGNOSIS — G9340 Encephalopathy, unspecified: Secondary | ICD-10-CM

## 2019-02-27 DIAGNOSIS — R451 Restlessness and agitation: Secondary | ICD-10-CM

## 2019-02-27 DIAGNOSIS — Z87891 Personal history of nicotine dependence: Secondary | ICD-10-CM

## 2019-02-27 DIAGNOSIS — I272 Pulmonary hypertension, unspecified: Secondary | ICD-10-CM

## 2019-02-27 DIAGNOSIS — Z952 Presence of prosthetic heart valve: Secondary | ICD-10-CM

## 2019-02-27 DIAGNOSIS — N186 End stage renal disease: Secondary | ICD-10-CM

## 2019-02-27 DIAGNOSIS — K7460 Unspecified cirrhosis of liver: Secondary | ICD-10-CM

## 2019-02-27 DIAGNOSIS — F419 Anxiety disorder, unspecified: Secondary | ICD-10-CM

## 2019-02-27 DIAGNOSIS — K1379 Other lesions of oral mucosa: Secondary | ICD-10-CM

## 2019-02-27 DIAGNOSIS — Z888 Allergy status to other drugs, medicaments and biological substances status: Secondary | ICD-10-CM

## 2019-02-27 DIAGNOSIS — J90 Pleural effusion, not elsewhere classified: Secondary | ICD-10-CM

## 2019-02-27 DIAGNOSIS — I509 Heart failure, unspecified: Secondary | ICD-10-CM

## 2019-02-27 DIAGNOSIS — R7881 Bacteremia: Secondary | ICD-10-CM

## 2019-02-27 LAB — BLOOD CULTURE ID PANEL (REFLEXED)
Acinetobacter baumannii: NOT DETECTED
Candida albicans: NOT DETECTED
Candida glabrata: NOT DETECTED
Candida krusei: NOT DETECTED
Candida parapsilosis: NOT DETECTED
Candida tropicalis: NOT DETECTED
Enterobacter cloacae complex: NOT DETECTED
Enterobacteriaceae species: NOT DETECTED
Enterococcus species: NOT DETECTED
Escherichia coli: NOT DETECTED
Haemophilus influenzae: NOT DETECTED
Klebsiella oxytoca: NOT DETECTED
Klebsiella pneumoniae: NOT DETECTED
Listeria monocytogenes: NOT DETECTED
Methicillin resistance: NOT DETECTED
Neisseria meningitidis: NOT DETECTED
Proteus species: NOT DETECTED
Pseudomonas aeruginosa: NOT DETECTED
Serratia marcescens: NOT DETECTED
Staphylococcus aureus (BCID): DETECTED — AB
Staphylococcus species: DETECTED — AB
Streptococcus agalactiae: NOT DETECTED
Streptococcus pneumoniae: NOT DETECTED
Streptococcus pyogenes: NOT DETECTED
Streptococcus species: DETECTED — AB

## 2019-02-27 LAB — COMPREHENSIVE METABOLIC PANEL
ALT: 21 U/L (ref 0–44)
AST: 59 U/L — ABNORMAL HIGH (ref 15–41)
Albumin: 2.7 g/dL — ABNORMAL LOW (ref 3.5–5.0)
Alkaline Phosphatase: 106 U/L (ref 38–126)
Anion gap: 14 (ref 5–15)
BUN: 21 mg/dL — ABNORMAL HIGH (ref 6–20)
CO2: 24 mmol/L (ref 22–32)
Calcium: 7.9 mg/dL — ABNORMAL LOW (ref 8.9–10.3)
Chloride: 96 mmol/L — ABNORMAL LOW (ref 98–111)
Creatinine, Ser: 5.58 mg/dL — ABNORMAL HIGH (ref 0.61–1.24)
GFR calc Af Amer: 12 mL/min — ABNORMAL LOW (ref >60)
GFR calc non Af Amer: 10 mL/min — ABNORMAL LOW (ref >60)
Glucose, Bld: 103 mg/dL — ABNORMAL HIGH (ref 70–99)
Potassium: 3.5 mmol/L (ref 3.5–5.1)
Sodium: 134 mmol/L — ABNORMAL LOW (ref 135–145)
Total Bilirubin: 1.5 mg/dL — ABNORMAL HIGH (ref 0.3–1.2)
Total Protein: 6.7 g/dL (ref 6.5–8.1)

## 2019-02-27 LAB — LACTIC ACID, PLASMA: Lactic Acid, Venous: 3 mmol/L (ref 0.5–1.9)

## 2019-02-27 LAB — CBC
HCT: 21.5 % — ABNORMAL LOW (ref 39.0–52.0)
Hemoglobin: 7.5 g/dL — ABNORMAL LOW (ref 13.0–17.0)
MCH: 27.6 pg (ref 26.0–34.0)
MCHC: 34.9 g/dL (ref 30.0–36.0)
MCV: 79 fL — ABNORMAL LOW (ref 80.0–100.0)
Platelets: 188 10*3/uL (ref 150–400)
RBC: 2.72 MIL/uL — ABNORMAL LOW (ref 4.22–5.81)
RDW: 15.6 % — ABNORMAL HIGH (ref 11.5–15.5)
WBC: 15.3 10*3/uL — ABNORMAL HIGH (ref 4.0–10.5)
nRBC: 0 % (ref 0.0–0.2)

## 2019-02-27 LAB — GLUCOSE, CAPILLARY
Glucose-Capillary: 55 mg/dL — ABNORMAL LOW (ref 70–99)
Glucose-Capillary: 60 mg/dL — ABNORMAL LOW (ref 70–99)
Glucose-Capillary: 65 mg/dL — ABNORMAL LOW (ref 70–99)
Glucose-Capillary: 70 mg/dL (ref 70–99)
Glucose-Capillary: 71 mg/dL (ref 70–99)

## 2019-02-27 LAB — PROTIME-INR
INR: 3.3 — ABNORMAL HIGH (ref 0.8–1.2)
Prothrombin Time: 33 seconds — ABNORMAL HIGH (ref 11.4–15.2)

## 2019-02-27 LAB — HEMOGLOBIN AND HEMATOCRIT, BLOOD
HCT: 25.6 % — ABNORMAL LOW (ref 39.0–52.0)
Hemoglobin: 8.5 g/dL — ABNORMAL LOW (ref 13.0–17.0)

## 2019-02-27 LAB — TROPONIN I: Troponin I: 0.11 ng/mL (ref <0.03)

## 2019-02-27 LAB — APTT: aPTT: 79 seconds — ABNORMAL HIGH (ref 24–36)

## 2019-02-27 LAB — ECHOCARDIOGRAM LIMITED

## 2019-02-27 MED ORDER — AMIODARONE HCL 200 MG PO TABS
400.0000 mg | ORAL_TABLET | Freq: Two times a day (BID) | ORAL | Status: DC
  Administered 2019-02-27 – 2019-03-06 (×13): 400 mg via ORAL
  Filled 2019-02-27 (×14): qty 2

## 2019-02-27 MED ORDER — SODIUM CHLORIDE 0.9 % IV BOLUS
500.0000 mL | Freq: Once | INTRAVENOUS | Status: AC
  Administered 2019-02-27: 19:00:00 500 mL via INTRAVENOUS

## 2019-02-27 MED ORDER — DEXTROSE 50 % IV SOLN
50.0000 mL | Freq: Once | INTRAVENOUS | Status: AC
  Administered 2019-02-27: 50 mL via INTRAVENOUS

## 2019-02-27 MED ORDER — SODIUM CHLORIDE 0.9 % IV BOLUS
500.0000 mL | Freq: Once | INTRAVENOUS | Status: AC
  Administered 2019-02-27: 21:00:00 500 mL via INTRAVENOUS

## 2019-02-27 MED ORDER — AMIODARONE HCL IN DEXTROSE 360-4.14 MG/200ML-% IV SOLN
30.0000 mg/h | INTRAVENOUS | Status: DC
  Filled 2019-02-27 (×2): qty 200

## 2019-02-27 MED ORDER — SODIUM CHLORIDE 0.9 % IV SOLN
Freq: Once | INTRAVENOUS | Status: AC
  Administered 2019-02-27: 19:00:00 via INTRAVENOUS

## 2019-02-27 MED ORDER — SODIUM CHLORIDE 0.9 % IV SOLN
2.0000 g | INTRAVENOUS | Status: DC
  Administered 2019-02-27: 15:00:00 2 g via INTRAVENOUS
  Filled 2019-02-27 (×3): qty 20

## 2019-02-27 MED ORDER — VANCOMYCIN HCL IN DEXTROSE 750-5 MG/150ML-% IV SOLN: 750.0000 mg | INTRAVENOUS | Status: DC

## 2019-02-27 MED ORDER — MIDODRINE HCL 5 MG PO TABS
5.0000 mg | ORAL_TABLET | Freq: Once | ORAL | Status: AC
  Administered 2019-02-27: 22:00:00 5 mg via ORAL
  Filled 2019-02-27: qty 1

## 2019-02-27 MED ORDER — VANCOMYCIN HCL 10 G IV SOLR
1250.0000 mg | Freq: Once | INTRAVENOUS | Status: DC
  Filled 2019-02-27: qty 1250

## 2019-02-27 MED ORDER — AMIODARONE HCL IN DEXTROSE 360-4.14 MG/200ML-% IV SOLN
30.0000 mg/h | INTRAVENOUS | Status: AC
  Administered 2019-02-27: 30 mg/h via INTRAVENOUS
  Filled 2019-02-27: qty 200

## 2019-02-27 MED ORDER — DEXTROSE 50 % IV SOLN
INTRAVENOUS | Status: AC
  Administered 2019-02-27: 50 mL
  Filled 2019-02-27: qty 50

## 2019-02-27 MED ORDER — MIDODRINE HCL 5 MG PO TABS
ORAL_TABLET | ORAL | Status: AC
  Filled 2019-02-27: qty 2

## 2019-02-27 MED ORDER — DEXTROSE 50 % IV SOLN
INTRAVENOUS | Status: AC
  Administered 2019-02-27: 06:00:00 25 mL
  Filled 2019-02-27: qty 50

## 2019-02-27 NOTE — Progress Notes (Addendum)
PHARMACY - PHYSICIAN COMMUNICATION CRITICAL VALUE ALERT - BLOOD CULTURE IDENTIFICATION (BCID)  Michael [name] is an 58 y.o. male who presented to [facility] on 02/26/2019 with a chief complaint of 2/2 blood cx with [place]  Name of physician (or Provider) Contacted: Dr Tommy Medal  Current antibiotics: None  Changes to prescribed antibiotics recommended:  Start ceftriaxone  Results for orders placed or performed during the hospital encounter of 02/26/19  Blood Culture ID Panel (Reflexed) (Collected: 02/26/2019  9:05 PM)  Result Value Ref Range   Enterococcus species NOT DETECTED NOT DETECTED   Listeria monocytogenes NOT DETECTED NOT DETECTED   Staphylococcus species DETECTED (A) NOT DETECTED   Staphylococcus aureus (BCID) DETECTED (A) NOT DETECTED   Methicillin resistance NOT DETECTED NOT DETECTED   Streptococcus species DETECTED (A) NOT DETECTED   Streptococcus agalactiae NOT DETECTED NOT DETECTED   Streptococcus pneumoniae NOT DETECTED NOT DETECTED   Streptococcus pyogenes NOT DETECTED NOT DETECTED   Acinetobacter baumannii NOT DETECTED NOT DETECTED   Enterobacteriaceae species NOT DETECTED NOT DETECTED   Enterobacter cloacae complex NOT DETECTED NOT DETECTED   Escherichia coli NOT DETECTED NOT DETECTED   Klebsiella oxytoca NOT DETECTED NOT DETECTED   Klebsiella pneumoniae NOT DETECTED NOT DETECTED   Proteus species NOT DETECTED NOT DETECTED   Serratia marcescens NOT DETECTED NOT DETECTED   Haemophilus influenzae NOT DETECTED NOT DETECTED   Neisseria meningitidis NOT DETECTED NOT DETECTED   Pseudomonas aeruginosa NOT DETECTED NOT DETECTED   Candida albicans NOT DETECTED NOT DETECTED   Candida glabrata NOT DETECTED NOT DETECTED   Candida krusei NOT DETECTED NOT DETECTED   Candida parapsilosis NOT DETECTED NOT DETECTED   Candida tropicalis NOT DETECTED NOT DETECTED   Levester Fresh, PharmD, BCPS, BCCCP Clinical Pharmacist [phone]  Please check AMION for all [name]  numbers  02/27/2019 11:08 AM   ADDENDUM:  Note adjusted above to reflect  initial BCID call from lab reported MRSA,  Later corrected to MSSA and strep.   ID discontinued Vancomycin order.   Continue Ceftriaxone.   Nicole Cella, RPh Clinical Pharmacist Please check AMION for all [name] phone numbers After 10:00 PM, call [name] [phone] 02/27/2019 2:53 PM

## 2019-02-27 NOTE — Progress Notes (Signed)
  Echocardiogram 2D Echocardiogram has been performed.  Michael [name] 02/27/2019, 11:14 AM

## 2019-02-27 NOTE — Progress Notes (Signed)
Several attempts have been made to call [phone] EKG dept to complete EKG order, voice mails were left, waiting on call back

## 2019-02-27 NOTE — Progress Notes (Signed)
[name] Progress Note   Assessment/ Plan:   Dialysis Orders:  MWFS 4 hr [place] 3K 2.25 Ca no heparin EDW 63 but leaving below the past two treatments - last HD 4/6 Var Na no profile 300/800 hectorol 1 mircera 30 q 2 weeks - last 4/6 Recent labs: h hgb 11.1 4/1 INR 5.98 4/6 41% sat Ca 8.8 P 1.8 ipTH 175 alb 3.6   Assessment/Plan: 1.  AMS - ? Multifactorial - mild leukocytosis, afebrile, ? infilatrate, hx meth use, polypharmcy -- also has baclofen on med list - which is contraindicated for ESRD patients.  TTE given MVR and antibiotics to cover endocarditis- vanc and ceftriaxone.  CT abd/ pelvis without acute findings yesterday 2. R pleural effusion: noted on CT abd/pelvis 4/11. 3. Oral bleeding: from recent tooth extraction.  INR supratherapeutic as an OP and is being corrected now   4. Wide complex tachycardia: s/p amiodarone bolus, now in NSR.  History of Afib as well 5.  ESRD -  MWFS- last HD 4/6 - leaving below edw- had previously been on 4 x a week HD due to large IDWG - not an issue of late.  HD 4/11, next planned HD 4/13.. Midodrine ordered.   6.  Hypertension/volume  - volume on CXR - not much on exam - appears to be losing weight 7.  Anemia  - hgb 9.7 down from 11.1 - last tsat ok - watch trend - not due for redose until 4/15 - if drops further resume ESA next week 8.  Metabolic bone disease -  Resume hectorol Monday - last P low - no binders for now 9.  Nutrition - renal diet  10. 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 Substance abuse - hx of meth use - drug screen pending 13. Left wrist fx last month -? Bleeding into fx area causing increased pain from swelling? 14. Supratherapeutic INR - since 4/6 - likely contributory to mouth bleeding  15. PAF/MVR - on chronic coumadin - per primary 16. Cirrhosis - T Bili up slightly 1.5   Subjective:    Still encephalopathic.  Had wide complex  tachycardia last night, never lost pulse, got amio bolus.  HD last night as well, received midodrine during.     Objective:   BP (!) 122/105 (BP Location: Right Arm)   Pulse 63   Temp 98 F (36.7 C) (Axillary)   Resp 20   SpO2 95%   Physical Exam: Gen: lying in bed, confused, moaning. HEENT: dried blood caking mouth, mask placed CVS: irregular Resp: clear anteriorly but hard to hear  Abd: slightly distended Ext: no LE edema Skin: diffuse petechial rash all over body  Labs: BMET Recent Labs  Lab 02/26/19 1253 02/26/19 2116  NA 131* 131*  K 4.5 4.8  CL 89* 92*  CO2 19* 20*  GLUCOSE 67* 70  BUN 71* 77*  CREATININE 12.17* 12.06*  CALCIUM 9.5 9.1   CBC Recent Labs  Lab 02/26/19 1253 02/27/19 1119  WBC 13.7* 15.3*  NEUTROABS 12.1*  --   HGB 9.7* 7.5*  HCT 28.5* 21.5*  MCV 80.3 79.0*  PLT 213 188    @IMGRELPRIORS @ Medications:    . calcium acetate  667 mg Oral TID WC  . Chlorhexidine Gluconate Cloth  6 each Topical Q0600  . [START ON 02/28/2019] doxercalciferol  1 mcg Intravenous Q M,W,F-HD  . fluticasone  2 spray Each  Nare Daily  . folic acid  1 mg Oral Daily  . loratadine  10 mg Oral Daily  . metoprolol tartrate  12.5 mg Oral BID  . sodium chloride flush  3 mL Intravenous Q12H     Madelon Lips, MD [facility] pgr [phone] 02/27/2019, 12:05 PM

## 2019-02-27 NOTE — Progress Notes (Signed)
Paged by bedside RN regarding pts SBP in the 70's. Pt asymptomatic and receiving 559ml NS bolus at the time. Stat H& H drawn. Hgb stable and up at 8.5 from previous of 7.5. Midodrine was previously held and pt did receive HD today. Discussed with pharmacy and ordered 5mg  of Midodrine PO and an additional 533ml NS Bolus to run over 2 hours. Will continue to monitor.  [name] AGPCNP-BC, AGNP-C [facility] Pager [phone]

## 2019-02-27 NOTE — Progress Notes (Signed)
Rapid response was called during shift change. Pt's systolic BP has been in 88'B and 60's. Pt was receiving bolus when walked in the room. Rapid response David by bedside. After pt was done with 5000 cc NS bolus, another 500 cc bolus started at 250 ml/hr per MD order. [facility] notified per Shanon Brow. Pt is moaning , and groaning. 2l o2 via  placed per rapid. After 1st bolus systolic BP in 33'Q. Waiting on lactic acid result. Will continue to monitor.

## 2019-02-27 NOTE — Progress Notes (Signed)
Progress Note  Patient Name: [name] Date of Encounter: 02/27/2019  Primary Cardiologist: [name], MD   Subjective   No events this AM  Inpatient Medications    Scheduled Meds:  calcium acetate  667 mg Oral TID WC   Chlorhexidine Gluconate Cloth  6 each Topical Q0600   [START ON 02/28/2019] doxercalciferol  1 mcg Intravenous Q M,W,F-HD   fluticasone  2 spray Each Nare Daily   folic acid  1 mg Oral Daily   loratadine  10 mg Oral Daily   metoprolol tartrate  12.5 mg Oral BID   sodium chloride flush  3 mL Intravenous Q12H   Continuous Infusions:  amiodarone     amiodarone     PRN Meds: hydrALAZINE, lidocaine, midodrine, ondansetron **OR** ondansetron (ZOFRAN) IV, oxyCODONE, sodium chloride flush   Vital Signs    Vitals:   02/27/19 0330 02/27/19 0400 02/27/19 0415 02/27/19 0600  BP: (!) 115/41 (!) 67/52 94/82 (!) 122/105  Pulse: 76 97 63   Resp:   20   Temp:   98 F (36.7 C)   TempSrc:   Axillary   SpO2:   95%     Intake/Output Summary (Last 24 hours) at 02/27/2019 0826 Last data filed at 02/27/2019 0415 Gross per 24 hour  Intake --  Output 600 ml  Net -600 ml   Last 3 Weights 02/05/2019 02/05/2019 02/05/2019  Weight (lbs) 141 lb 1.5 oz 141 lb 1.5 oz 147 lb 14.9 oz  Weight (kg) 64 kg 64 kg 67.1 kg      Telemetry    afib with RBBB - Personally Reviewed  ECG   na  Physical Exam   GEN: No acute distress.   Neck: No JVD Cardiac: irreg, 2/6 systolic murmur apex.  Respiratory: Clear to auscultation bilaterally. GI: Soft, nontender, non-distended  MS: No edema; No deformity. Neuro:  Nonfocal  Psych: Normal affect   Labs    Chemistry Recent Labs  Lab 02/26/19 1253 02/26/19 2116  NA 131* 131*  K 4.5 4.8  CL 89* 92*  CO2 19* 20*  GLUCOSE 67* 70  BUN 71* 77*  CREATININE 12.17* 12.06*  CALCIUM 9.5 9.1  PROT 8.0  --   ALBUMIN 3.3*  --   AST 46*  --   ALT 20  --   ALKPHOS 146*  --   BILITOT 1.5*  --   GFRNONAA 4* 4*   GFRAA 5* 5*  ANIONGAP 23* 19*     Hematology Recent Labs  Lab 02/26/19 1253  WBC 13.7*  RBC 3.55*  HGB 9.7*  HCT 28.5*  MCV 80.3  MCH 27.3  MCHC 34.0  RDW 15.9*  PLT 213    Cardiac Enzymes Recent Labs  Lab 02/26/19 2116  TROPONINI 0.04*   No results for input(s): TROPIPOC in the last 168 hours.   BNPNo results for input(s): BNP, PROBNP in the last 168 hours.   DDimer No results for input(s): DDIMER in the last 168 hours.   Radiology    Ct Abdomen Pelvis Wo Contrast  Result Date: 02/26/2019 CLINICAL DATA:  Abdominal pain, acute. EXAM: CT ABDOMEN AND PELVIS WITHOUT CONTRAST TECHNIQUE: Multidetector CT imaging of the abdomen and pelvis was performed following the standard protocol without IV contrast. COMPARISON:  CT abdomen dated 07/13/2017. FINDINGS: Lower chest: RIGHT pleural effusion, at least moderate in size, incompletely imaged. Rounded consolidation within the RIGHT lower lobe, atelectasis versus pneumonia. LEFT lung base is clear. Cardiomegaly, incompletely imaged. Hepatobiliary: Cirrhotic appearing liver.  Cholelithiasis without evidence of acute cholecystitis. No bile duct dilatation seen. Pancreas: Unremarkable. No pancreatic ductal dilatation or surrounding inflammatory changes. Spleen: Mild splenomegaly. Otherwise unremarkable. Adrenals/Urinary Tract: Staghorn calculus within the LEFT renal pelvis. No associated hydronephrosis. RIGHT kidney is unremarkable without stone or hydronephrosis. Bladder is unremarkable, partially decompressed. Stomach/Bowel: No dilated large or small bowel loops. Characterization of the bowel is limited by patient motion artifact, however, there is no pericolonic fluid or other secondary signs of active bowel wall inflammation. Moderate amount of gas and stool within the colon. Moderate amount of gas and fluid within the nondistended stomach. Vascular/Lymphatic: Aortic atherosclerosis. No enlarged lymph nodes seen. Reproductive: Prostate is  unremarkable. Other: No free fluid or abscess collection seen. No free intraperitoneal air seen. Musculoskeletal: No acute or suspicious osseous finding. Chronic appearing compression deformities of the T11, L3 and L4 vertebral bodies. IMPRESSION: 1. New RIGHT pleural effusion, at least moderate in size, incompletely imaged. New rounded consolidation within the right lower lobe, atelectasis versus pneumonia. 2. No acute findings within the abdomen or pelvis. No bowel obstruction or evidence of bowel wall inflammation. No evidence of acute solid organ abnormality. No free fluid or abscess collection seen. No free intraperitoneal air. 3. Cirrhotic appearing liver. 4. Cholelithiasis without evidence of acute cholecystitis. 5. Staghorn calculus within the left renal pelvis. No associated hydronephrosis. 6. Additional chronic/incidental findings detailed above. Aortic Atherosclerosis (ICD10-I70.0). Electronically Signed   By: Franki Cabot M.D.   On: 02/26/2019 21:05   Dg Wrist 2 Views Left  Result Date: 02/26/2019 CLINICAL DATA:  58 year old who fell and injured the LEFT wrist approximately 1 month ago. Persistent pain. Subsequent encounter. EXAM: LEFT WRIST - 2 VIEW COMPARISON:  02/03/2019. FINDINGS: Subacute comminuted and impacted intra-articular fracture involving the distal radius into multiple fragments, unchanged from the 02/03/2019 examination with the exception of perhaps slightly more impaction currently. Stable comminuted fracture involving the distal ulna, including the ulnar styloid. No new interval fractures. Radiocarpal joint space narrowing. Possible VISI deformity of the wrist. IMPRESSION: 1. Subacute comminuted and impacted intra-articular fracture involving the distal radius into multiple fragments, unchanged since the 02/03/19 examination with the exception of perhaps slightly more impaction currently. 2. Stable comminuted fracture involving the distal ulna, including the ulnar styloid. 3. No new  interval fractures. 4. Possible VISI deformity of the wrist. Electronically Signed   By: Evangeline Dakin M.D.   On: 02/26/2019 13:45   Ct Head Wo Contrast  Result Date: 02/26/2019 CLINICAL DATA:  Altered mental status. EXAM: CT HEAD WITHOUT CONTRAST TECHNIQUE: Contiguous axial images were obtained from the base of the skull through the vertex without intravenous contrast. COMPARISON:  CT scan of February 03, 2019. FINDINGS: Brain: No evidence of acute infarction, hemorrhage, hydrocephalus, extra-axial collection or mass lesion/mass effect. Vascular: No hyperdense vessel or unexpected calcification. Skull: Normal. Negative for fracture or focal lesion. Sinuses/Orbits: No acute finding. Other: None. IMPRESSION: No acute intracranial abnormality seen. Electronically Signed   By: Marijo Conception, M.D.   On: 02/26/2019 16:38   Dg Chest Port 1 View  Result Date: 02/26/2019 CLINICAL DATA:  Tachycardia EXAM: PORTABLE CHEST 1 VIEW COMPARISON:  02/03/2019 FINDINGS: Moderate cardiomegaly. Vascular congestion. Stable right pleural effusion and right basilar opacities. Minimal subsegmental atelectasis at the left base. Upper lungs clear. No pneumothorax. IMPRESSION: Stable vascular congestion. Stable right pleural effusion and basilar pulmonary opacities. Electronically Signed   By: Marybelle Killings M.D.   On: 02/26/2019 13:24    Cardiac Studies   Cardiac Catheterization  08/19/17 RPDA 100 LVEDP 14 PCWP 24  RPDA lesion, 100 %stenosed - unable to see the occlusion site, but collaterals filling the distal vessel are seen. Not a PCI option & likely not acute.  Otherwise minimal CAD elsewhere.  Hemodynamic findings consistent with at least moderate pulmonary hypertension with mildly elevated LV pressures   R heart cath 06/29/17 1. Elevated right and left heart filling pressures, more prominent RV failure.  2. Pulmonary venous hypertension.  3. Cardiac output vigorous on milrinone.   Patient Profile       Michael [name] a 58 y.o.malewith persistent atrial fibrillation,mitral valve disease status post bioprosthetic mitral valve replacement in 2012 at [facility] in [place], tricuspid valve repair,CAD with known RPDA CTO,ESRD on dialysis, OSA, diabetes, HTN, HL, CHF, pulmonary hypertension, anemia, poor adherence with medications  He is admitted with confusion, oral bleeding, supratherapeutic INR, possible sepsis  Assessment & Plan    1. Wide complex tachycardia - morphology similar to his baseline RBBB though wider, we will ask EP to look at strips. Unclear if VT vs SVT with aberrance - K was 4.5 Mg 2.2 - trop 0.04--> - 07/2018 echo LVEF 55-60%, no WMAs. Mild MS, PASP 66. Repeat echo pending.  - started on amiodarone gtt. No recurrent WCT since he was in ER  - continue amio. F/u trop trend and echo for signs of any new structural heart disease  2. Afib - has been on amidoarone, lopressor, and coumadin at home.  - INR 4.1 on admission - on amio gtt, continue lopressor. Supratherapeutic INR, coumadin on hold.    3. Bioprosthetic MV - normal function by last echo, f/u repeat   4. CAD - known RPDA CTO - follow trop trends   5. ESRD  - per nephrology   6. Oral bleeding - per primary team - supratherapetuic INR on admission and uremia.    7. Metabolic encephalopathy - per primary team   For questions or updates, please contact [place] Please consult [email or web address] for contact info under        Signed, Carlyle Dolly, MD  02/27/2019, 8:26 AM

## 2019-02-27 NOTE — Progress Notes (Signed)
  Echocardiogram 2D Echocardiogram has been performed.  Bobbye Charleston 02/27/2019, 11:13 AM

## 2019-02-27 NOTE — Progress Notes (Signed)
PROGRESS NOTE    Michael [name]  BLT:[id number] DOB: Jul 10, 1961 DOA: 02/26/2019 PCP: Charlott Rakes, MD    Brief Narrative:  57 y.o. 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  I was unable to obtain history from patient, attempted to call daughter x2 with no answers.  History per chart review.  Apparently patient missed dialysis multiple times this week he has had slow, bleeding for about a week and INR was checked and found to be elevated.  Apparently patient was yelling at his house and was found very confused and not making sense.  Level 5 caveat.   ED Course: While in the ED patient is yelling unintelligibly, moving and fidgeting his legs. When addressed directly he said yes to every question.  Lab work-up in the ED shows creatinine of 12.17, BUN 71, glucose 67, ALP 146, WBC 13.7, hemoglobin 9.7, INR 6.0, PT 38.9.  CT of the head with no acute findings.  Rectal temperature 200.2.   Assessment & Plan:   Active Problems:   Oral bleeding   Metabolic encephalopathy  Acute toxicmetabolic encephalopathy -Blood cx pos for MSSA bacteremia -Possible encephalopathy related to MSSA bacteremia. See below -Continue on HD per Nephrology -Have started rocephin with ID consulted  Abdominal pain -Uncertain etiology -Abd CT with finding of new R pleural effusion, staghorn renal calculus in the L renal pelvis without hydronephrosis, cirrhosis.  Elevated INR -Presenting INR 6.0 -Coumadin on hold -Repeat CBC and INR in am  PAF with wide complex tachycardia overnight -Cardiology following. Discussed with Dr. Harl Bowie today -Cardiology to discuss with EP - unclear if VT vs SVT with aberrant rhythm -cont to correct  electrolytes -Currently on amiodarone gtt -2d echo ordered, pending  Hypertension with chronic hypotension -Blood pressure elevated at presentation, held [place], continue metoprolol.   -Monitor BP. -Continue on hydralazine as needed for BP > 170/100  Diabetes mellitus type 2, now with hypoglycemia. -Patient is diet controlled at home, will continue to monitor will not add sliding scale at this time.  History of leukocytoclastic vasculitis -Diffuse petechial rash noted throughout the body.   -Monitor for now.  DVT prophylaxis: SCD Code Status: Full Family Communication: Pt in room, family not at bedside Disposition Plan: Uncertain at this time  Consultants:   Nephrology  Cardiology  ID  Procedures:     Antimicrobials: Anti-infectives (From admission, onward)   Start     Dose/Rate Route Frequency Ordered Stop   02/28/19 1200  vancomycin (VANCOCIN) IVPB 750 mg/150 ml premix     750 mg 150 mL/hr over 60 Minutes Intravenous Every M-W-F (Hemodialysis) 02/27/19 1223     02/27/19 1315  vancomycin (VANCOCIN) 1,250 mg in sodium chloride 0.9 % 250 mL IVPB     1,250 mg 166.7 mL/hr over 90 Minutes Intravenous  Once 02/27/19 1222     02/27/19 1200  cefTRIAXone (ROCEPHIN) 2 g in sodium chloride 0.9 % 100 mL IVPB     2 g 200 mL/hr over 30 Minutes Intravenous Every 24 hours 02/27/19 1108         Subjective: Unable to obtain as pt is encephalopathic  Objective: Vitals:   02/27/19 0330 02/27/19 0400 02/27/19 0415 02/27/19 0600  BP: (!) 115/41 (!) 67/52 94/82 (!) 122/105  Pulse: 76 97 63   Resp:  20   Temp:   98 F (36.7 C)   TempSrc:   Axillary   SpO2:   95%     Intake/Output Summary (Last 24 hours) at 02/27/2019 1255 Last data filed at 02/27/2019 0415 Gross per 24 hour  Intake --  Output 600 ml  Net -600 ml   There were no vitals filed for this visit.  Examination:  General exam: Appears calm and comfortable  Respiratory system: Clear to auscultation.  Respiratory effort normal. Cardiovascular system: S1 & S2 heard, RRR Gastrointestinal system: Abdomen is nondistended, soft and nontender. No organomegaly or masses felt. Normal bowel sounds heard. Central nervous system: Alert and oriented. No focal neurological deficits. Extremities: Symmetric 5 x 5 power. Skin: No rashes, lesions  Psychiatry: Unable to assess given encephalopathy  Data Reviewed: I have personally reviewed following labs and imaging studies  CBC: Recent Labs  Lab 02/26/19 1253 02/27/19 1119  WBC 13.7* 15.3*  NEUTROABS 12.1*  --   HGB 9.7* 7.5*  HCT 28.5* 21.5*  MCV 80.3 79.0*  PLT 213 130   Basic Metabolic Panel: Recent Labs  Lab 02/26/19 1253 02/26/19 2116 02/27/19 1119  NA 131* 131* 134*  K 4.5 4.8 3.5  CL 89* 92* 96*  CO2 19* 20* 24  GLUCOSE 67* 70 103*  BUN 71* 77* 21*  CREATININE 12.17* 12.06* 5.58*  CALCIUM 9.5 9.1 7.9*  MG  --  2.2  --    GFR: CrCl cannot be calculated (Unknown ideal weight.). Liver Function Tests: Recent Labs  Lab 02/26/19 1253 02/27/19 1119  AST 46* 59*  ALT 20 21  ALKPHOS 146* 106  BILITOT 1.5* 1.5*  PROT 8.0 6.7  ALBUMIN 3.3* 2.7*   No results for input(s): LIPASE, AMYLASE in the last 168 hours. No results for input(s): AMMONIA in the last 168 hours. Coagulation Profile: Recent Labs  Lab 02/21/19 02/26/19 1253 02/27/19 1119  INR 6.0* 4.1* 3.3*   Cardiac Enzymes: Recent Labs  Lab 02/26/19 2116 02/27/19 1119  TROPONINI 0.04* 0.11*   BNP (last 3 results) No results for input(s): PROBNP in the last 8760 hours. HbA1C: No results for input(s): HGBA1C in the last 72 hours. CBG: Recent Labs  Lab 02/27/19 0030 02/27/19 0223 02/27/19 0555 02/27/19 0621 02/27/19 0705  GLUCAP 65* 60* 55* 71 70   Lipid Profile: No results for input(s): CHOL, HDL, LDLCALC, TRIG, CHOLHDL, LDLDIRECT in the last 72 hours. Thyroid Function Tests: No results for input(s): TSH, T4TOTAL, FREET4, T3FREE, THYROIDAB in the  last 72 hours. Anemia Panel: No results for input(s): VITAMINB12, FOLATE, FERRITIN, TIBC, IRON, RETICCTPCT in the last 72 hours. Sepsis Labs: No results for input(s): PROCALCITON, LATICACIDVEN in the last 168 hours.  Recent Results (from the past 240 hour(s))  Culture, blood (Routine X 2) w Reflex to ID Panel     Status: None (Preliminary result)   Collection Time: 02/26/19  9:00 PM  Result Value Ref Range Status   Specimen Description BLOOD RIGHT HAND  Final   Special Requests   Final    BOTTLES DRAWN AEROBIC ONLY Blood Culture results may not be optimal due to an inadequate volume of blood received in culture bottles   Culture  Setup Time   Final    GRAM POSITIVE COCCI AEROBIC BOTTLE ONLY CRITICAL VALUE NOTED.  VALUE IS CONSISTENT WITH PREVIOUSLY REPORTED AND CALLED VALUE. Performed at [facility], [place] [address], [place], [place] [address]    Culture [facility] [place] POSITIVE COCCI  Final  Report Status PENDING  Incomplete  Culture, blood (Routine X 2) w Reflex to ID Panel     Status: None (Preliminary result)   Collection Time: 02/26/19  9:05 PM  Result Value Ref Range Status   Specimen Description BLOOD RIGHT FOREARM  Final   Special Requests   Final    BOTTLES DRAWN AEROBIC ONLY Blood Culture adequate volume   Culture  Setup Time   Final    GRAM POSITIVE COCCI AEROBIC BOTTLE ONLY CRITICAL RESULT CALLED TO, READ BACK BY AND VERIFIED WITH: PHARMD M [place] [id number] AT 1054 AM BY CM Performed at [facility], [place] [address], [place], [place] [address]    Culture GRAM POSITIVE COCCI  Final   Report Status PENDING  Incomplete  Blood Culture ID Panel (Reflexed)     Status: Abnormal   Collection Time: 02/26/19  9:05 PM  Result Value Ref Range Status   Enterococcus species NOT DETECTED NOT DETECTED Final   Listeria monocytogenes NOT DETECTED NOT DETECTED Final   Staphylococcus species DETECTED (A) NOT DETECTED Final    Comment: CRITICAL RESULT CALLED TO, READ BACK BY AND  VERIFIED WITH: [place] [id number] AT 1056 AM BY CM    Staphylococcus aureus (BCID) DETECTED (A) NOT DETECTED Final    Comment: Methicillin (oxacillin) susceptible Staphylococcus aureus (MSSA). Preferred therapy is anti staphylococcal beta lactam antibiotic (Cefazolin or Nafcillin), unless clinically contraindicated. CRITICAL RESULT CALLED TO, READ BACK BY AND VERIFIED WITH: PHARMD M [place] [id number] AT 56 AM BY CM    Methicillin resistance NOT DETECTED NOT DETECTED Final   Streptococcus species DETECTED (A) NOT DETECTED Final    Comment: Not Enterococcus species, Streptococcus agalactiae, Streptococcus pyogenes, or Streptococcus pneumoniae. CRITICAL RESULT CALLED TO, READ BACK BY AND VERIFIED WITH: PHARMD M [place] [id number] AT 27 AM BY CM    Streptococcus agalactiae NOT DETECTED NOT DETECTED Final   Streptococcus pneumoniae NOT DETECTED NOT DETECTED Final   Streptococcus pyogenes NOT DETECTED NOT DETECTED Final   Acinetobacter baumannii NOT DETECTED NOT DETECTED Final   Enterobacteriaceae species NOT DETECTED NOT DETECTED Final   Enterobacter cloacae complex NOT DETECTED NOT DETECTED Final   Escherichia coli NOT DETECTED NOT DETECTED Final   Klebsiella oxytoca NOT DETECTED NOT DETECTED Final   Klebsiella pneumoniae NOT DETECTED NOT DETECTED Final   Proteus species NOT DETECTED NOT DETECTED Final   Serratia marcescens NOT DETECTED NOT DETECTED Final   Haemophilus influenzae NOT DETECTED NOT DETECTED Final   Neisseria meningitidis NOT DETECTED NOT DETECTED Final   Pseudomonas aeruginosa NOT DETECTED NOT DETECTED Final   Candida albicans NOT DETECTED NOT DETECTED Final   Candida glabrata NOT DETECTED NOT DETECTED Final   Candida krusei NOT DETECTED NOT DETECTED Final   Candida parapsilosis NOT DETECTED NOT DETECTED Final   Candida tropicalis NOT DETECTED NOT DETECTED Final    Comment: Performed at [facility], [address] [place], [place] [address]     Radiology  Studies: Ct Abdomen Pelvis Wo Contrast  Result Date: 02/26/2019 CLINICAL DATA:  Abdominal pain, acute. EXAM: CT ABDOMEN AND PELVIS WITHOUT CONTRAST TECHNIQUE: Multidetector CT imaging of the abdomen and pelvis was performed following the standard protocol without IV contrast. COMPARISON:  CT abdomen dated 07/13/2017. FINDINGS: Lower chest: RIGHT pleural effusion, at least moderate in size, incompletely imaged. Rounded consolidation within the RIGHT lower lobe, atelectasis versus pneumonia. LEFT lung base is clear. Cardiomegaly, incompletely imaged. Hepatobiliary: Cirrhotic appearing liver. Cholelithiasis without evidence of acute cholecystitis. No bile  duct dilatation seen. Pancreas: Unremarkable. No pancreatic ductal dilatation or surrounding inflammatory changes. Spleen: Mild splenomegaly. Otherwise unremarkable. Adrenals/Urinary Tract: Staghorn calculus within the LEFT renal pelvis. No associated hydronephrosis. RIGHT kidney is unremarkable without stone or hydronephrosis. Bladder is unremarkable, partially decompressed. Stomach/Bowel: No dilated large or small bowel loops. Characterization of the bowel is limited by patient motion artifact, however, there is no pericolonic fluid or other secondary signs of active bowel wall inflammation. Moderate amount of gas and stool within the colon. Moderate amount of gas and fluid within the nondistended stomach. Vascular/Lymphatic: Aortic atherosclerosis. No enlarged lymph nodes seen. Reproductive: Prostate is unremarkable. Other: No free fluid or abscess collection seen. No free intraperitoneal air seen. Musculoskeletal: No acute or suspicious osseous finding. Chronic appearing compression deformities of the T11, L3 and L4 vertebral bodies. IMPRESSION: 1. New RIGHT pleural effusion, at least moderate in size, incompletely imaged. New rounded consolidation within the right lower lobe, atelectasis versus pneumonia. 2. No acute findings within the abdomen or pelvis. No  bowel obstruction or evidence of bowel wall inflammation. No evidence of acute solid organ abnormality. No free fluid or abscess collection seen. No free intraperitoneal air. 3. Cirrhotic appearing liver. 4. Cholelithiasis without evidence of acute cholecystitis. 5. Staghorn calculus within the left renal pelvis. No associated hydronephrosis. 6. Additional chronic/incidental findings detailed above. Aortic Atherosclerosis (ICD10-I70.0). Electronically Signed   By: Franki Cabot M.D.   On: 02/26/2019 21:05   Dg Wrist 2 Views Left  Result Date: 02/26/2019 CLINICAL DATA:  59 year old who fell and injured the LEFT wrist approximately 1 month ago. Persistent pain. Subsequent encounter. EXAM: LEFT WRIST - 2 VIEW COMPARISON:  02/03/2019. FINDINGS: Subacute comminuted and impacted intra-articular fracture involving the distal radius into multiple fragments, unchanged from the 02/03/2019 examination with the exception of perhaps slightly more impaction currently. Stable comminuted fracture involving the distal ulna, including the ulnar styloid. No new interval fractures. Radiocarpal joint space narrowing. Possible VISI deformity of the wrist. IMPRESSION: 1. Subacute comminuted and impacted intra-articular fracture involving the distal radius into multiple fragments, unchanged since the 02/03/19 examination with the exception of perhaps slightly more impaction currently. 2. Stable comminuted fracture involving the distal ulna, including the ulnar styloid. 3. No new interval fractures. 4. Possible VISI deformity of the wrist. Electronically Signed   By: Evangeline Dakin M.D.   On: 02/26/2019 13:45   Ct Head Wo Contrast  Result Date: 02/26/2019 CLINICAL DATA:  Altered mental status. EXAM: CT HEAD WITHOUT CONTRAST TECHNIQUE: Contiguous axial images were obtained from the base of the skull through the vertex without intravenous contrast. COMPARISON:  CT scan of February 03, 2019. FINDINGS: Brain: No evidence of acute  infarction, hemorrhage, hydrocephalus, extra-axial collection or mass lesion/mass effect. Vascular: No hyperdense vessel or unexpected calcification. Skull: Normal. Negative for fracture or focal lesion. Sinuses/Orbits: No acute finding. Other: None. IMPRESSION: No acute intracranial abnormality seen. Electronically Signed   By: Marijo Conception, M.D.   On: 02/26/2019 16:38   Dg Chest Port 1 View  Result Date: 02/26/2019 CLINICAL DATA:  Tachycardia EXAM: PORTABLE CHEST 1 VIEW COMPARISON:  02/03/2019 FINDINGS: Moderate cardiomegaly. Vascular congestion. Stable right pleural effusion and right basilar opacities. Minimal subsegmental atelectasis at the left base. Upper lungs clear. No pneumothorax. IMPRESSION: Stable vascular congestion. Stable right pleural effusion and basilar pulmonary opacities. Electronically Signed   By: Marybelle Killings M.D.   On: 02/26/2019 13:24    Scheduled Meds:  calcium acetate  667 mg Oral TID WC   Chlorhexidine  Gluconate Cloth  6 each Topical Q0600   [START ON 02/28/2019] doxercalciferol  1 mcg Intravenous Q M,W,F-HD   fluticasone  2 spray Each Nare Daily   folic acid  1 mg Oral Daily   loratadine  10 mg Oral Daily   metoprolol tartrate  12.5 mg Oral BID   sodium chloride flush  3 mL Intravenous Q12H   Continuous Infusions:  amiodarone 30 mg/hr (02/27/19 7573)   amiodarone     cefTRIAXone (ROCEPHIN)  IV     vancomycin     [START ON 02/28/2019] vancomycin       LOS: 1 day   Marylu Lund, MD [facility] Pager On Amion  If 7PM-7AM, please contact night-coverage 02/27/2019, 12:55 PM

## 2019-02-27 NOTE — Consult Note (Addendum)
Date of Admission:  02/26/2019          Reason for Consult: MSSAB    Referring Provider: Terrilyn Saver auto consult (though email never fired) and Dr. Wyline Copas   Assessment:  1. MSSA and Streptococcal Bacteremia  And and concern for  2. 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 Encephalopathy 5. Wrist pain 6. Rash ? Is origin of this, is this due to vasculitis or is this from [facility] in pt w cirrhosis 7. Right pleural effusion 8. Bleeding from mouth with supratherapeutic INR 9. VTachycardia 10. Cirrhosis  Plan:  1. Ceftriaxone for now 2. Check blood cultures tomorrow morning 3. As blood cultures are clearing would add gentamicin and rifampin 4. Work-up source of infection,?  Does his AV fistula need to be examined more closely.  His wrist? 5. Would consider asking IR to get sample of the fluid for analysis to ensure this is not an empyema 6. Less of work-up unless he has endocarditis convincingly excluded clinically and via transesophageal echocardiogram I would favor treating him empirically for prosthetic valve endocarditis  Active Problems:   Oral bleeding   Metabolic encephalopathy   Scheduled Meds: . calcium acetate  667 mg Oral TID WC  . Chlorhexidine Gluconate Cloth  6 each Topical Q0600  . [START ON 02/28/2019] doxercalciferol  1 mcg Intravenous Q M,W,F-HD  . fluticasone  2 spray Each Nare Daily  . folic acid  1 mg Oral Daily  . loratadine  10 mg Oral Daily  . metoprolol tartrate  12.5 mg Oral BID  . sodium chloride flush  3 mL Intravenous Q12H   Continuous Infusions: . amiodarone 30 mg/hr (02/27/19 0838)  . amiodarone    . cefTRIAXone (ROCEPHIN)  IV    . vancomycin    . [START ON 02/28/2019] vancomycin     PRN Meds:.hydrALAZINE, lidocaine, midodrine, ondansetron **OR** ondansetron (ZOFRAN) IV, oxyCODONE, sodium chloride flush  HPI: Michael [name] is a 59 y.o. male 5 w/ PMHx Afib(s/p MAZE and MVR in  2011), CHF, Cirrhosis, Pulm HTN, DM, ESRD. Apparently he had this his last 2 hemodialysis sessions and then his daughter went to him and found that his door was locked and he was screaming about pain in his wrist. He also said that he could not walk. She dated that he was confused because he kept telling her to come in even though the door was locked and did not seem to understand that it was locked. EMS reports that they had to kick the door down to get to him. When they found him he had a petechial rash covering his body and crusted blood around his mouth. The daughter stated that he has had this rash for some time and they have been going to a dermatologist but that it seems have gotten a lot worse lately. She also states that he has had bleeding from his mouth and sometimes coughs up blood because of his CHF but also she noticed bleeding from 1 of his old tooth extraction site that has been oozing slowly.   He had blood cultures drawn at admission andTWO/TWO are growing GPC ID by BC[facility] as   methicillin sensitive Staphylococcus aureus species as well as a streptococcal species.  He has had further work-up with a CT abdomen pelvis that showed a right sided sided pleural effusion.  I examined him he was moaning and not making any words that I could understand.  I tried to use the telephonic [name] interpreter but this was not fruitful.  Apparently has been answering some questions in [place] but did not when I saw him.  My exam was compromised by his encephalopathy and needs need of a [name] interpreter.  I would like to more closely examine the hand where he has been complaining of wrist pain.  We will use ceftriaxone for now because it should be good for covering the streptococcal species and also should cover methicillin sensitive staph aureus though is not a preferred agent.  It also will penetrate the central nervous system if that is needed.  I doubt we will build to get a  transesophageal echocardiogram so regardless of the work-up we are going to end up needing to treat him for prosthetic valve endocarditis with a beta-lactam plus an aminoglycoside and rifampin  I also think his fistula site needs to be closely monitored.  Wonder if his rash is due to his bacteremia I have seen similar rashes in cirrhotic patients with bacteremia.  Carries a diagnosis of a leukocytoclastic vasculitis but I do not know how that vasculitis manifest itself.      Review of Systems: Review of Systems  Unable to perform ROS: Critical illness    Past Medical History:  Diagnosis Date  . Abdominal pain 02/25/2016  . Acute encephalopathy 08/06/2018  . Acute hypoxemic respiratory failure ([place]) 12/29/2015  . Advance care planning   . Anemia - mild exacerbation 10/15/2015  . Anemia of chronic disease   . Aortic regurgitation 02/27/2016  . Asthma    said he does not know  . Atrial fibrillation with RVR ([place]) 02/19/2016  . Calf pain 09/10/2015  . Chronic kidney disease (CKD), stage IV (severe) ([place])   . Chronic systolic CHF (congestive heart failure) (HCC)    a. prior EF normal; b. Echo 10/16: Mild LVH, EF 30-35%, mitral valve bioprosthesis present without evidence of stenosis or regurgitation, severe LAE, mild RVE with mild to moderately reduced RVSF, moderate RAE  . Cirrhosis ([place])   . Congestive heart failure (CHF) ([place]) 02/25/2016  . Coronary artery disease   . Diabetes mellitus type 2 in obese ([place])   . Diabetes mellitus without complication ([place])   . Dilated cardiomyopathy ([place]) 12/24/2015  . Dyspnea   . Elevated troponin 11/24/2017  . ESRD (end stage renal disease) on dialysis ([place])   . Fluid overload 11/24/2017  . GERD (gastroesophageal reflux disease)   . Gout 10/23/2015  . H/O tricuspid valve repair 2012  . History of echocardiogram    Echo at [facility] in [facility] 4/12: mod LVH, EF 60-65%, mod LAE, tissue MVR ok, mod TR, mod pulmo HTN with RVSP 48 mmHg  . HLD (hyperlipidemia)  10/05/2016  . Hyperkalemia 11/24/2017  . Hypertension   . Mitral valve disease   . Obstructive sleep apnea   . Pancreatitis, ruled out 02/25/2016  . Paroxysmal atrial fibrillation (HCC)    s/p Maze procedure at time of MVR in 2011  . Permanent atrial fibrillation 02/25/2016  . Pleural effusion   . Polypharmacy 11/23/2015  . Pulmonary HTN ([place])   . 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 Septic shock ([place])   . Syncope and collapse 08/10/2017  . Thrombocytopenia ([place])   . Viral gastroenteritis 02/27/2016    Social History   Tobacco Use  . Smoking status: Former Smoker    Packs/day: 1.00    Types: Cigarettes  . Smokeless tobacco: Never Used  Substance Use  Topics  . Alcohol use: No  . Drug use: No    Family History  Problem Relation Age of Onset  . Heart attack Neg Hx    Allergies  Allergen Reactions  . Triamcinolone Other (See Comments)    Felt like skin was burning    OBJECTIVE: Blood pressure (!) 122/105, pulse 63, temperature 98 F (36.7 C), temperature source Axillary, resp. rate 20, SpO2 95 %.  Physical Exam Constitutional:      General: He is in acute distress.  HENT:     Head: Normocephalic.     Mouth/Throat:     Comments: Blood crusting of her mouth Eyes:     Extraocular Movements: Extraocular movements intact.  Neck:     Musculoskeletal: Normal range of motion.  Cardiovascular:     Rate and Rhythm: Tachycardia present.     Heart sounds: No murmur. No gallop.   Pulmonary:     Effort: No respiratory distress.     Breath sounds: No wheezing or rhonchi.  Abdominal:     General: Abdomen is flat. Bowel sounds are normal.     Palpations: Abdomen is soft.  Musculoskeletal:     Right lower leg: No edema.     Left lower leg: No edema.  Skin:    Findings: Rash present.  Neurological:     Mental Status: He is alert. He is disoriented.  Psychiatric:        Attention and Perception: He is attentive.        Mood and Affect: Mood is anxious.         Speech: He is noncommunicative.        Behavior: Behavior is agitated.        Cognition and Memory: Cognition is impaired.    Head       Rash on arms and legs:            Lab Results Lab Results  Component Value Date   WBC 15.3 (H) 02/27/2019   HGB 7.5 (L) 02/27/2019   HCT 21.5 (L) 02/27/2019   MCV 79.0 (L) 02/27/2019   PLT 188 02/27/2019    Lab Results  Component Value Date   CREATININE 12.06 (H) 02/26/2019   BUN 77 (H) 02/26/2019   NA 131 (L) 02/26/2019   K 4.8 02/26/2019   CL 92 (L) 02/26/2019   CO2 20 (L) 02/26/2019    Lab Results  Component Value Date   ALT 20 02/26/2019   AST 46 (H) 02/26/2019   ALKPHOS 146 (H) 02/26/2019   BILITOT 1.5 (H) 02/26/2019     Microbiology: Recent Results (from the past 240 hour(s))  Culture, blood (Routine X 2) w Reflex to ID Panel     Status: None (Preliminary result)   Collection Time: 02/26/19  9:00 PM  Result Value Ref Range Status   Specimen Description BLOOD RIGHT HAND  Final   Special Requests   Final    BOTTLES DRAWN AEROBIC ONLY Blood Culture results may not be optimal due to an inadequate volume of blood received in culture bottles   Culture  Setup Time   Final    GRAM POSITIVE COCCI AEROBIC BOTTLE ONLY CRITICAL VALUE NOTED.  VALUE IS CONSISTENT WITH PREVIOUSLY REPORTED AND CALLED VALUE. Performed at [facility], [place] [address] [place], [place] [address]    Culture GRAM POSITIVE COCCI  Final   Report Status PENDING  Incomplete  Culture, blood (Routine X 2) w Reflex to ID Panel  Status: None (Preliminary result)   Collection Time: 02/26/19  9:05 PM  Result Value Ref Range Status   Specimen Description BLOOD RIGHT FOREARM  Final   Special Requests   Final    BOTTLES DRAWN AEROBIC ONLY Blood Culture adequate volume   Culture  Setup Time   Final    GRAM POSITIVE COCCI AEROBIC BOTTLE ONLY CRITICAL RESULT CALLED TO, READ BACK BY AND VERIFIED WITH: PHARMD M [place] [id number] AT 1054 AM BY CM  Performed at [facility], [place] [address], [place], [place] [address]    Culture GRAM POSITIVE COCCI  Final   Report Status PENDING  Incomplete  Blood Culture ID Panel (Reflexed)     Status: Abnormal   Collection Time: 02/26/19  9:05 PM  Result Value Ref Range Status   Enterococcus species NOT DETECTED NOT DETECTED Final   Listeria monocytogenes NOT DETECTED NOT DETECTED Final   Staphylococcus species DETECTED (A) NOT DETECTED Final    Comment: CRITICAL RESULT CALLED TO, READ BACK BY AND VERIFIED WITH: [place] [id number] AT 1056 AM BY CM    Staphylococcus aureus (BCID) DETECTED (A) NOT DETECTED Final    Comment: Methicillin (oxacillin) susceptible Staphylococcus aureus (MSSA). Preferred therapy is anti staphylococcal beta lactam antibiotic (Cefazolin or Nafcillin), unless clinically contraindicated. CRITICAL RESULT CALLED TO, READ BACK BY AND VERIFIED WITH: PHARMD M [place] [id number] AT 58 AM BY CM    Methicillin resistance NOT DETECTED NOT DETECTED Final   Streptococcus species DETECTED (A) NOT DETECTED Final    Comment: Not Enterococcus species, Streptococcus agalactiae, Streptococcus pyogenes, or Streptococcus pneumoniae. CRITICAL RESULT CALLED TO, READ BACK BY AND VERIFIED WITH: PHARMD M [place] [id number] AT 34 AM BY CM    Streptococcus agalactiae NOT DETECTED NOT DETECTED Final   Streptococcus pneumoniae NOT DETECTED NOT DETECTED Final   Streptococcus pyogenes NOT DETECTED NOT DETECTED Final   Acinetobacter baumannii NOT DETECTED NOT DETECTED Final   Enterobacteriaceae species NOT DETECTED NOT DETECTED Final   Enterobacter cloacae complex NOT DETECTED NOT DETECTED Final   Escherichia coli NOT DETECTED NOT DETECTED Final   Klebsiella oxytoca NOT DETECTED NOT DETECTED Final   Klebsiella pneumoniae NOT DETECTED NOT DETECTED Final   Proteus species NOT DETECTED NOT DETECTED Final   Serratia marcescens NOT DETECTED NOT DETECTED Final   Haemophilus influenzae NOT DETECTED NOT  DETECTED Final   Neisseria meningitidis NOT DETECTED NOT DETECTED Final   Pseudomonas aeruginosa NOT DETECTED NOT DETECTED Final   Candida albicans NOT DETECTED NOT DETECTED Final   Candida glabrata NOT DETECTED NOT DETECTED Final   Candida krusei NOT DETECTED NOT DETECTED Final   Candida parapsilosis NOT DETECTED NOT DETECTED Final   Candida tropicalis NOT DETECTED NOT DETECTED Final    Comment: Performed at [facility], [address] [place], [place] [address]    Alcide Evener, [place] [facility] [phone] pager  02/27/2019, 12:44 PM

## 2019-02-27 NOTE — Progress Notes (Signed)
On called [facility] text paged with lactic acid of 3.0.  2120: charles Bodenheimer from [facility] called back and stated just watch him for now since he is already getting bolus. Will continue to monitor.

## 2019-02-28 ENCOUNTER — Other Ambulatory Visit: Payer: Self-pay | Admitting: Family Medicine

## 2019-02-28 DIAGNOSIS — G4709 Other insomnia: Secondary | ICD-10-CM

## 2019-02-28 DIAGNOSIS — I4891 Unspecified atrial fibrillation: Secondary | ICD-10-CM

## 2019-02-28 DIAGNOSIS — R7881 Bacteremia: Secondary | ICD-10-CM

## 2019-02-28 DIAGNOSIS — E162 Hypoglycemia, unspecified: Secondary | ICD-10-CM

## 2019-02-28 DIAGNOSIS — E1122 Type 2 diabetes mellitus with diabetic chronic kidney disease: Secondary | ICD-10-CM

## 2019-02-28 DIAGNOSIS — G8929 Other chronic pain: Secondary | ICD-10-CM

## 2019-02-28 DIAGNOSIS — M545 Low back pain, unspecified: Secondary | ICD-10-CM

## 2019-02-28 DIAGNOSIS — T148XXA Other injury of unspecified body region, initial encounter: Secondary | ICD-10-CM

## 2019-02-28 DIAGNOSIS — X58XXXA Exposure to other specified factors, initial encounter: Secondary | ICD-10-CM

## 2019-02-28 DIAGNOSIS — S62102A Fracture of unspecified carpal bone, left wrist, initial encounter for closed fracture: Secondary | ICD-10-CM

## 2019-02-28 LAB — CBC
HCT: 24.7 % — ABNORMAL LOW (ref 39.0–52.0)
Hemoglobin: 8.5 g/dL — ABNORMAL LOW (ref 13.0–17.0)
MCH: 27.9 pg (ref 26.0–34.0)
MCHC: 34.4 g/dL (ref 30.0–36.0)
MCV: 81 fL (ref 80.0–100.0)
Platelets: 169 10*3/uL (ref 150–400)
RBC: 3.05 MIL/uL — ABNORMAL LOW (ref 4.22–5.81)
RDW: 16.7 % — ABNORMAL HIGH (ref 11.5–15.5)
WBC: 15.7 10*3/uL — ABNORMAL HIGH (ref 4.0–10.5)
nRBC: 0.1 % (ref 0.0–0.2)

## 2019-02-28 LAB — LACTIC ACID, PLASMA: Lactic Acid, Venous: 2.7 mmol/L (ref 0.5–1.9)

## 2019-02-28 LAB — BASIC METABOLIC PANEL
Anion gap: 17 — ABNORMAL HIGH (ref 5–15)
BUN: 29 mg/dL — ABNORMAL HIGH (ref 6–20)
CO2: 24 mmol/L (ref 22–32)
Calcium: 8.5 mg/dL — ABNORMAL LOW (ref 8.9–10.3)
Chloride: 95 mmol/L — ABNORMAL LOW (ref 98–111)
Creatinine, Ser: 6.47 mg/dL — ABNORMAL HIGH (ref 0.61–1.24)
GFR calc Af Amer: 10 mL/min — ABNORMAL LOW (ref >60)
GFR calc non Af Amer: 9 mL/min — ABNORMAL LOW (ref >60)
Glucose, Bld: 84 mg/dL (ref 70–99)
Potassium: 3.8 mmol/L (ref 3.5–5.1)
Sodium: 136 mmol/L (ref 135–145)

## 2019-02-28 LAB — PROTIME-INR
INR: 2.1 — ABNORMAL HIGH (ref 0.8–1.2)
Prothrombin Time: 23.6 seconds — ABNORMAL HIGH (ref 11.4–15.2)

## 2019-02-28 MED ORDER — MIDODRINE HCL 5 MG PO TABS
10.0000 mg | ORAL_TABLET | Freq: Three times a day (TID) | ORAL | Status: DC
  Administered 2019-02-28 – 2019-03-06 (×16): 10 mg via ORAL
  Filled 2019-02-28 (×20): qty 2

## 2019-02-28 MED ORDER — CEFAZOLIN SODIUM-DEXTROSE 2-4 GM/100ML-% IV SOLN
2.0000 g | Freq: Once | INTRAVENOUS | Status: AC
  Administered 2019-02-28: 2 g via INTRAVENOUS
  Filled 2019-02-28: qty 100

## 2019-02-28 MED ORDER — MIDODRINE HCL 5 MG PO TABS
5.0000 mg | ORAL_TABLET | Freq: Three times a day (TID) | ORAL | Status: DC
  Administered 2019-02-28 (×2): 5 mg via ORAL
  Filled 2019-02-28 (×5): qty 1

## 2019-02-28 MED ORDER — SODIUM CHLORIDE 0.9 % IV BOLUS
1000.0000 mL | Freq: Once | INTRAVENOUS | Status: AC
  Administered 2019-02-28: 11:00:00 1000 mL via INTRAVENOUS

## 2019-02-28 MED ORDER — SODIUM CHLORIDE 0.9 % IV BOLUS
250.0000 mL | Freq: Once | INTRAVENOUS | Status: AC
  Administered 2019-02-28: 08:00:00 250 mL via INTRAVENOUS

## 2019-02-28 MED ORDER — SODIUM CHLORIDE 0.9 % IV BOLUS
250.0000 mL | Freq: Once | INTRAVENOUS | Status: AC
  Administered 2019-02-28: 250 mL via INTRAVENOUS

## 2019-02-28 MED ORDER — ACETAMINOPHEN 325 MG PO TABS
650.0000 mg | ORAL_TABLET | Freq: Three times a day (TID) | ORAL | Status: DC | PRN
  Administered 2019-02-28 – 2019-03-02 (×3): 650 mg via ORAL
  Filled 2019-02-28 (×3): qty 2

## 2019-02-28 MED ORDER — CEFAZOLIN SODIUM-DEXTROSE 2-4 GM/100ML-% IV SOLN: 2.0000 g | INTRAVENOUS | Status: DC

## 2019-02-28 NOTE — Significant Event (Signed)
Rapid Response Event Note  Overview:  Rounding on patient, chart shows on going hypotension despite receiving 3L+ crystalloid since admit.     Initial Focused Assessment: Patient laying in bed, lethargic, but arousable and appropriate.  BP cuff currently on RUE SBP 74.  SBP has been 60's off and on today.  BP taken intermittently on LE.  BUE 2+ pitting edema including hands.  Lips and mouth dry but sleeping, patient mouth breathes and has been sleeping much of the day.    Interventions: Encouraged RN to call MD, Dr Wyline Copas & Renal agree SBP goal is normally 80s.   Will avoid further IVFs, continue midodrine TID.   Plan of Care (if not transferred): Follow BP, SBP goal of 80.    Event Summary: Arrival Time: 3790 End Time: 1640   Carl Best

## 2019-02-28 NOTE — Progress Notes (Addendum)
[facility] Progress Note   Assessment/ Plan:   Dialysis Orders:  MWFS 4 hr [place] 3K 2.25 Ca no heparin EDW 63 but leaving below the past two treatments - last HD 4/6 Var Na no profile 300/800 hectorol 1 mircera 30 q 2 weeks - last 4/6 Recent labs: h hgb 11.1 4/1 INR 5.98 4/6 41% sat Ca 8.8 P 1.8 ipTH 175 alb 3.6   Assessment/Plan: 1.  AMS - +MSSA bacteremia, likely cause. BP's low in 70's, his baseline is SBP 80's- 100. CT abd/ pelvis negative. On empiric abx, getting IVF boluses for hypotension. He is following simple commands this am.  2. Volume - dry on exam, getting fluids now 3. Hypotension - acute on chronic, as above, will ^ midodrine to 10 tid for now.  4. ESRD on HD : 4x/ week HD. Hold HD today. Last HD was Sat. Reassess in am. Hopefully BP's will be up some.  5. Oral bleeding: from recent tooth extraction/ Jeannie Done.  6. Wide complex tachycardia: s/p amiodarone bolus, now in NSR.  History of Afib as well 7.  Anemia ckd - hgb down 8.5, last tsat ok - watch trend - not due for redose until 4/15 - if drops further resume ESA next week.  8.  Metabolic bone disease -  Resume hectorol Monday - last P low - no binders for now 9.  Nutrition - renal diet  10. DM - per primary 11. Chronic rash / leukocytoclastic vasculitis - seems stable - has had for some time now - no heparin HD 12. Substance abuse - hx of meth use - drug screen pending 13. Left wrist fx - last month  14. Supratherapeutic INR - since 4/6, undergoing correction 15. PAF/MVR - on chronic coumadin - per primary 16. Cirrhosis - T Bili up slightly 1.5  17. EOL - I spoke w/ pt's daughter who is next of kin, she states he recently told her he would want everything done in case of arrest but doesn't want prolonged life support if not doing well.   Subjective:    Hypotensive this am in 70's.  Blood cx's are +MSSA , possible sepsis, getting fluid boluses now    Objective:   BP (!) 102/47 (BP Location: Right Leg)    Pulse (!) 104   Temp 98.4 F (36.9 C) (Oral)   Resp (!) 26   Wt 64.1 kg   SpO2 93%   BMI 25.85 kg/m   Physical Exam: Gen: lying in bed, confused, moaning, resopnds to simple commands HEENT: dried blood in mouth CVS: irregular Resp: clear anteriorly and lat Abd: slightly distended Ext: no LE edema Skin: diffuse petechial rash LE's mostly, also torso LUA AVF+bruit  Labs: BMET Recent Labs  Lab 02/26/19 1253 02/26/19 2116 02/27/19 1119 02/28/19 0027  NA 131* 131* 134* 136  K 4.5 4.8 3.5 3.8  CL 89* 92* 96* 95*  CO2 19* 20* 24 24  GLUCOSE 67* 70 103* 84  BUN 71* 77* 21* 29*  CREATININE 12.17* 12.06* 5.58* 6.47*  CALCIUM 9.5 9.1 7.9* 8.5*   CBC Recent Labs  Lab 02/26/19 1253 02/27/19 1119 02/27/19 2035 02/28/19 0027  WBC 13.7* 15.3*  --  15.7*  NEUTROABS 12.1*  --   --   --   HGB 9.7* 7.5* 8.5* 8.5*  HCT 28.5* 21.5* 25.6* 24.7*  MCV 80.3 79.0*  --  81.0  PLT 213 188  --  169    @IMGRELPRIORS @ Medications:    . amiodarone  400 mg Oral BID  . calcium acetate  667 mg Oral TID WC  . Chlorhexidine Gluconate Cloth  6 each Topical Q0600  . doxercalciferol  1 mcg Intravenous Q M,W,F-HD  . fluticasone  2 spray Each Nare Daily  . folic acid  1 mg Oral Daily  . loratadine  10 mg Oral Daily  . midodrine  5 mg Oral TID WC  . sodium chloride flush  3 mL Intravenous Q12H

## 2019-02-28 NOTE — Progress Notes (Signed)
PROGRESS NOTE    Michael [name]  BWL:[id number] DOB: 1961-09-30 DOA: 02/26/2019 PCP: Charlott Rakes, MD    Brief Narrative:  58 y.o. 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  I was unable to obtain history from patient, attempted to call daughter x2 with no answers.  History per chart review.  Apparently patient missed dialysis multiple times this week he has had slow, bleeding for about a week and INR was checked and found to be elevated.  Apparently patient was yelling at his house and was found very confused and not making sense.  Level 5 caveat.   ED Course: While in the ED patient is yelling unintelligibly, moving and fidgeting his legs. When addressed directly he said yes to every question.  Lab work-up in the ED shows creatinine of 12.17, BUN 71, glucose 67, ALP 146, WBC 13.7, hemoglobin 9.7, INR 6.0, PT 38.9.  CT of the head with no acute findings.  Rectal temperature 200.2.   Assessment & Plan:   Active Problems:   Oral bleeding   Encephalopathy   Wrist fracture, closed, left, with routine healing, subsequent encounter   Metabolic encephalopathy   Bacteremia   Fracture   Hypoglycemia   MSSA bacteremia  Acute toxicmetabolic encephalopathy -Blood cx pos for MSSA bacteremia -Possible toxic metabolic encephalopathy related to MSSA bacteremia. See below -Treating for presumptive endocarditis -Continue on HD per Nephrology -Pt on ancef per ID. -Recent 2d echo without vegetations. ID recommendation for MRI of L wrist MRI when more stable to r/o wrist infection  Abdominal pain -Uncertain etiology -Abd CT with finding of new R pleural effusion, staghorn renal calculus in the L renal pelvis without  hydronephrosis, cirrhosis. -Seems stable at present  Elevated INR -Presenting INR 6.0 -Coumadin on hold -Recheck CBC and INR in am  PAF with wide complex tachycardia overnight -Cardiology following. Discussed with Dr. Harl Bowie today -Cardiology to discuss with EP - unclear if VT vs SVT with aberrant rhythm -cont to correct electrolytes -Continued on amiodarone -2d echo ordered, no evidence of vegetations. Per Cardiology, not candidate for TEE given risk for hemodynamic compromise   Hypertension with chronic hypotension -BP noted to be hypotensive -BP meds including beta blockers held - Give IVF boluses as tolerated. Pt does appear dry on exam  Diabetes mellitus type 2, now with hypoglycemia. -Patient is diet controlled at home, will continue to monitor will not add sliding scale at this time -seems stable at present.  History of leukocytoclastic vasculitis -Diffuse petechial rash noted throughout the body.   -Monitor for now.  DVT prophylaxis: SCD Code Status: Full Family Communication: Pt in room, family not at bedside Disposition Plan: Uncertain at this time  Consultants:   Nephrology  Cardiology  ID  Procedures:     Antimicrobials: Anti-infectives (From admission, onward)   Start     Dose/Rate Route Frequency Ordered Stop   03/02/19 1800  ceFAZolin (ANCEF) IVPB 2g/100 mL premix     2 g 200 mL/hr over 30 Minutes Intravenous Every M-W-F-Sa (1800) 02/28/19 1036     02/28/19 1400  ceFAZolin (ANCEF) IVPB 2g/100 mL premix     2 g 200 mL/hr over 30 Minutes Intravenous  Once 02/28/19 1033     02/28/19 1200  vancomycin (VANCOCIN) IVPB 750 mg/150 ml premix  Status:  Discontinued     750 mg 150 mL/hr over 60 Minutes Intravenous Every M-W-F (Hemodialysis) 02/27/19 1223 02/27/19 1258   02/27/19 1315  vancomycin (VANCOCIN) 1,250 mg in sodium chloride 0.9 % 250 mL IVPB  Status:  Discontinued     1,250 mg 166.7 mL/hr over 90 Minutes Intravenous  Once 02/27/19 1222  02/27/19 1258   02/27/19 1200  cefTRIAXone (ROCEPHIN) 2 g in sodium chloride 0.9 % 100 mL IVPB  Status:  Discontinued     2 g 200 mL/hr over 30 Minutes Intravenous Every 24 hours 02/27/19 1108 02/28/19 1033      Subjective: Seems confused this AM, unable to assess  Objective: Vitals:   02/28/19 0941 02/28/19 1014 02/28/19 1130 02/28/19 1135  BP: (!) 79/51 (!) 70/35 (!) 102/47   Pulse:   (!) 104   Resp: 20  (!) 26   Temp:      TempSrc:      SpO2:   93%   Weight:    64.1 kg    Intake/Output Summary (Last 24 hours) at 02/28/2019 1207 Last data filed at 02/28/2019 1010 Gross per 24 hour  Intake 1770.86 ml  Output 0 ml  Net 1770.86 ml   Filed Weights   02/28/19 1135  Weight: 64.1 kg    Examination: General exam: Awake, laying in bed, in nad Respiratory system: Normal respiratory effort, no wheezing Cardiovascular system: regular rate, s1, s2 Gastrointestinal system: Soft, nondistended, positive BS Central nervous system: CN2-12 grossly intact, strength intact Extremities: Perfused, no clubbing Skin: dry mucus membranes, diffuse poetical rash throughout B LE and UE Psychiatry: Unable to assess, confused   Data Reviewed: I have personally reviewed following labs and imaging studies  CBC: Recent Labs  Lab 02/26/19 1253 02/27/19 1119 02/27/19 2035 02/28/19 0027  WBC 13.7* 15.3*  --  15.7*  NEUTROABS 12.1*  --   --   --   HGB 9.7* 7.5* 8.5* 8.5*  HCT 28.5* 21.5* 25.6* 24.7*  MCV 80.3 79.0*  --  81.0  PLT 213 188  --  621   Basic Metabolic Panel: Recent Labs  Lab 02/26/19 1253 02/26/19 2116 02/27/19 1119 02/28/19 0027  NA 131* 131* 134* 136  K 4.5 4.8 3.5 3.8  CL 89* 92* 96* 95*  CO2 19* 20* 24 24  GLUCOSE 67* 70 103* 84  BUN 71* 77* 21* 29*  CREATININE 12.17* 12.06* 5.58* 6.47*  CALCIUM 9.5 9.1 7.9* 8.5*  MG  --  2.2  --   --    GFR: Estimated Creatinine Clearance: 9.5 mL/min (A) (by C-G formula based on SCr of 6.47 mg/dL (H)). Liver Function Tests:  Recent Labs  Lab 02/26/19 1253 02/27/19 1119  AST 46* 59*  ALT 20 21  ALKPHOS 146* 106  BILITOT 1.5* 1.5*  PROT 8.0 6.7  ALBUMIN 3.3* 2.7*   No results for input(s): LIPASE, AMYLASE in the last 168 hours. No results for input(s): AMMONIA in the last 168 hours. Coagulation Profile: Recent Labs  Lab 02/26/19 1253 02/27/19 1119 02/28/19 0924  INR 4.1* 3.3* 2.1*   Cardiac Enzymes: Recent Labs  Lab 02/26/19 2116 02/27/19 1119  TROPONINI 0.04* 0.11*   BNP (last 3 results) No results for input(s): PROBNP in the last 8760 hours. HbA1C: No results for input(s): HGBA1C in the last 72 hours. CBG: Recent Labs  Lab 02/27/19 0030 02/27/19 0223 02/27/19 0555 02/27/19 0621 02/27/19 0705  GLUCAP 65* 60* 55* 71 70   Lipid Profile: No results for input(s): CHOL,  HDL, LDLCALC, TRIG, CHOLHDL, LDLDIRECT in the last 72 hours. Thyroid Function Tests: No results for input(s): TSH, T4TOTAL, FREET4, T3FREE, THYROIDAB in the last 72 hours. Anemia Panel: No results for input(s): VITAMINB12, FOLATE, FERRITIN, TIBC, IRON, RETICCTPCT in the last 72 hours. Sepsis Labs: Recent Labs  Lab 02/27/19 2040 02/28/19 0027  LATICACIDVEN 3.0* 2.7*    Recent Results (from the past 240 hour(s))  Culture, blood (Routine X 2) w Reflex to ID Panel     Status: Abnormal (Preliminary result)   Collection Time: 02/26/19  9:00 PM  Result Value Ref Range Status   Specimen Description BLOOD RIGHT HAND  Final   Special Requests   Final    BOTTLES DRAWN AEROBIC ONLY Blood Culture results may not be optimal due to an inadequate volume of blood received in culture bottles   Culture  Setup Time   Final    GRAM POSITIVE COCCI AEROBIC BOTTLE ONLY CRITICAL VALUE NOTED.  VALUE IS CONSISTENT WITH PREVIOUSLY REPORTED AND CALLED VALUE.    Culture (A)  Final    STAPHYLOCOCCUS AUREUS SUSCEPTIBILITIES TO FOLLOW Performed at [facility], [place] [address], [place], [place] [address]    Report Status PENDING   Incomplete  Culture, blood (Routine X 2) w Reflex to ID Panel     Status: Abnormal (Preliminary result)   Collection Time: 02/26/19  9:05 PM  Result Value Ref Range Status   Specimen Description BLOOD RIGHT FOREARM  Final   Special Requests   Final    BOTTLES DRAWN AEROBIC ONLY Blood Culture adequate volume   Culture  Setup Time   Final    GRAM POSITIVE COCCI AEROBIC BOTTLE ONLY CRITICAL RESULT CALLED TO, READ BACK BY AND VERIFIED WITH: PHARMD M [place] [id number] AT 1054 AM BY CM    Culture (A)  Final    STAPHYLOCOCCUS AUREUS SUSCEPTIBILITIES TO FOLLOW Performed at [facility], [place] [address], [place], [place] [address]    Report Status PENDING  Incomplete  Blood Culture ID Panel (Reflexed)     Status: Abnormal   Collection Time: 02/26/19  9:05 PM  Result Value Ref Range Status   Enterococcus species NOT DETECTED NOT DETECTED Final   Listeria monocytogenes NOT DETECTED NOT DETECTED Final   Staphylococcus species DETECTED (A) NOT DETECTED Final    Comment: CRITICAL RESULT CALLED TO, READ BACK BY AND VERIFIED WITH: [place] [id number] AT 1056 AM BY CM    Staphylococcus aureus (BCID) DETECTED (A) NOT DETECTED Final    Comment: Methicillin (oxacillin) susceptible Staphylococcus aureus (MSSA). Preferred therapy is anti staphylococcal beta lactam antibiotic (Cefazolin or Nafcillin), unless clinically contraindicated. CRITICAL RESULT CALLED TO, READ BACK BY AND VERIFIED WITH: PHARMD M [place] [id number] AT 42 AM BY CM    Methicillin resistance NOT DETECTED NOT DETECTED Final   Streptococcus species DETECTED (A) NOT DETECTED Final    Comment: Not Enterococcus species, Streptococcus agalactiae, Streptococcus pyogenes, or Streptococcus pneumoniae. CRITICAL RESULT CALLED TO, READ BACK BY AND VERIFIED WITH: PHARMD M [place] [id number] AT 65 AM BY CM    Streptococcus agalactiae NOT DETECTED NOT DETECTED Final   Streptococcus pneumoniae NOT DETECTED NOT DETECTED Final   Streptococcus  pyogenes NOT DETECTED NOT DETECTED Final   Acinetobacter baumannii NOT DETECTED NOT DETECTED Final   Enterobacteriaceae species NOT DETECTED NOT DETECTED Final   Enterobacter cloacae complex NOT DETECTED NOT DETECTED Final   Escherichia coli NOT DETECTED NOT DETECTED Final   Klebsiella oxytoca NOT DETECTED NOT DETECTED Final  Klebsiella pneumoniae NOT DETECTED NOT DETECTED Final   Proteus species NOT DETECTED NOT DETECTED Final   Serratia marcescens NOT DETECTED NOT DETECTED Final   Haemophilus influenzae NOT DETECTED NOT DETECTED Final   Neisseria meningitidis NOT DETECTED NOT DETECTED Final   Pseudomonas aeruginosa NOT DETECTED NOT DETECTED Final   Candida albicans NOT DETECTED NOT DETECTED Final   Candida glabrata NOT DETECTED NOT DETECTED Final   Candida krusei NOT DETECTED NOT DETECTED Final   Candida parapsilosis NOT DETECTED NOT DETECTED Final   Candida tropicalis NOT DETECTED NOT DETECTED Final    Comment: Performed at [facility], [employer] [address], [place], [place] [address]     Radiology Studies: Ct Abdomen Pelvis Wo Contrast  Result Date: 02/26/2019 CLINICAL DATA:  Abdominal pain, acute. EXAM: CT ABDOMEN AND PELVIS WITHOUT CONTRAST TECHNIQUE: Multidetector CT imaging of the abdomen and pelvis was performed following the standard protocol without IV contrast. COMPARISON:  CT abdomen dated 07/13/2017. FINDINGS: Lower chest: RIGHT pleural effusion, at least moderate in size, incompletely imaged. Rounded consolidation within the RIGHT lower lobe, atelectasis versus pneumonia. LEFT lung base is clear. Cardiomegaly, incompletely imaged. Hepatobiliary: Cirrhotic appearing liver. Cholelithiasis without evidence of acute cholecystitis. No bile duct dilatation seen. Pancreas: Unremarkable. No pancreatic ductal dilatation or surrounding inflammatory changes. Spleen: Mild splenomegaly. Otherwise unremarkable. Adrenals/Urinary Tract: Staghorn calculus within the LEFT renal pelvis. No  associated hydronephrosis. RIGHT kidney is unremarkable without stone or hydronephrosis. Bladder is unremarkable, partially decompressed. Stomach/Bowel: No dilated large or small bowel loops. Characterization of the bowel is limited by patient motion artifact, however, there is no pericolonic fluid or other secondary signs of active bowel wall inflammation. Moderate amount of gas and stool within the colon. Moderate amount of gas and fluid within the nondistended stomach. Vascular/Lymphatic: Aortic atherosclerosis. No enlarged lymph nodes seen. Reproductive: Prostate is unremarkable. Other: No free fluid or abscess collection seen. No free intraperitoneal air seen. Musculoskeletal: No acute or suspicious osseous finding. Chronic appearing compression deformities of the T11, L3 and L4 vertebral bodies. IMPRESSION: 1. New RIGHT pleural effusion, at least moderate in size, incompletely imaged. New rounded consolidation within the right lower lobe, atelectasis versus pneumonia. 2. No acute findings within the abdomen or pelvis. No bowel obstruction or evidence of bowel wall inflammation. No evidence of acute solid organ abnormality. No free fluid or abscess collection seen. No free intraperitoneal air. 3. Cirrhotic appearing liver. 4. Cholelithiasis without evidence of acute cholecystitis. 5. Staghorn calculus within the left renal pelvis. No associated hydronephrosis. 6. Additional chronic/incidental findings detailed above. Aortic Atherosclerosis (ICD10-I70.0). Electronically Signed   By: Franki Cabot M.D.   On: 02/26/2019 21:05   Dg Wrist 2 Views Left  Result Date: 02/26/2019 CLINICAL DATA:  58 year old who fell and injured the LEFT wrist approximately 1 month ago. Persistent pain. Subsequent encounter. EXAM: LEFT WRIST - 2 VIEW COMPARISON:  02/03/2019. FINDINGS: Subacute comminuted and impacted intra-articular fracture involving the distal radius into multiple fragments, unchanged from the 02/03/2019  examination with the exception of perhaps slightly more impaction currently. Stable comminuted fracture involving the distal ulna, including the ulnar styloid. No new interval fractures. Radiocarpal joint space narrowing. Possible VISI deformity of the wrist. IMPRESSION: 1. Subacute comminuted and impacted intra-articular fracture involving the distal radius into multiple fragments, unchanged since the 02/03/19 examination with the exception of perhaps slightly more impaction currently. 2. Stable comminuted fracture involving the distal ulna, including the ulnar styloid. 3. No new interval fractures. 4. Possible VISI deformity of the wrist.  Electronically Signed   By: Evangeline Dakin M.D.   On: 02/26/2019 13:45   Ct Head Wo Contrast  Result Date: 02/26/2019 CLINICAL DATA:  Altered mental status. EXAM: CT HEAD WITHOUT CONTRAST TECHNIQUE: Contiguous axial images were obtained from the base of the skull through the vertex without intravenous contrast. COMPARISON:  CT scan of February 03, 2019. FINDINGS: Brain: No evidence of acute infarction, hemorrhage, hydrocephalus, extra-axial collection or mass lesion/mass effect. Vascular: No hyperdense vessel or unexpected calcification. Skull: Normal. Negative for fracture or focal lesion. Sinuses/Orbits: No acute finding. Other: None. IMPRESSION: No acute intracranial abnormality seen. Electronically Signed   By: Marijo Conception, M.D.   On: 02/26/2019 16:38   Dg Chest Port 1 View  Result Date: 02/26/2019 CLINICAL DATA:  Tachycardia EXAM: PORTABLE CHEST 1 VIEW COMPARISON:  02/03/2019 FINDINGS: Moderate cardiomegaly. Vascular congestion. Stable right pleural effusion and right basilar opacities. Minimal subsegmental atelectasis at the left base. Upper lungs clear. No pneumothorax. IMPRESSION: Stable vascular congestion. Stable right pleural effusion and basilar pulmonary opacities. Electronically Signed   By: Marybelle Killings M.D.   On: 02/26/2019 13:24    Scheduled Meds:  . amiodarone  400 mg Oral BID  . calcium acetate  667 mg Oral TID WC  . Chlorhexidine Gluconate Cloth  6 each Topical Q0600  . doxercalciferol  1 mcg Intravenous Q M,W,F-HD  . fluticasone  2 spray Each Nare Daily  . folic acid  1 mg Oral Daily  . loratadine  10 mg Oral Daily  . midodrine  5 mg Oral TID WC  . sodium chloride flush  3 mL Intravenous Q12H   Continuous Infusions: .  ceFAZolin (ANCEF) IV    . [START ON 03/02/2019]  ceFAZolin (ANCEF) IV       LOS: 2 days   Marylu Lund, MD [facility] Pager On Amion  If 7PM-7AM, please contact night-coverage 02/28/2019, 12:07 PM

## 2019-02-28 NOTE — Significant Event (Signed)
Rapid Response Event Note  Overview: Cardiac- hypotension    Initial Focused Assessment: Michael [name] is hypotensive 60/40 manual pressure and receiving a NS bolus of 500cc. Pt received 400mg  Amio po at 1421 and then becomes hypotensive about 1645.   HR 95 afib, BP 75/53 (61), RR 18 with sats 94% on RA. No obvious signs of blood loss. Michael [name] notified by RN and h/h ordered to confirm blood count.  NS boluses ordered and will be given slowly due to valve issue. BBS clear and diminished in the bases.  Pt is arousable and moaning. MAEx4 equally. Evening dose of Amio is being held per order.  Interventions: -NS bolus -h/h  -Midodrine per order  Plan of Care (if not transferred): -continue NS boluses -monitor changes in mental status, HR, skin color to help monitor symptoms   Event Summary  Outcome: Stayed in room and stabalized  Event End Time: 2200  Michael [name]

## 2019-02-28 NOTE — Progress Notes (Addendum)
Progress Note  Patient Name: Michael [name] Date of Encounter: 02/28/2019  Primary Cardiologist: [name], MD   Subjective   The patient is unable to provide any meaningful history.  He denies pain in his chest.  He denies shortness of breath.  However, very difficult to obtain a reliable account of how he is doing.  Inpatient Medications    Scheduled Meds:  amiodarone  400 mg Oral BID   calcium acetate  667 mg Oral TID WC   Chlorhexidine Gluconate Cloth  6 each Topical Q0600   doxercalciferol  1 mcg Intravenous Q M,W,F-HD   fluticasone  2 spray Each Nare Daily   folic acid  1 mg Oral Daily   loratadine  10 mg Oral Daily   metoprolol tartrate  12.5 mg Oral BID   midodrine  5 mg Oral TID WC   sodium chloride flush  3 mL Intravenous Q12H   Continuous Infusions:  cefTRIAXone (ROCEPHIN)  IV 2 g (02/27/19 1440)   sodium chloride     PRN Meds: hydrALAZINE, lidocaine, midodrine, ondansetron **OR** ondansetron (ZOFRAN) IV, oxyCODONE, sodium chloride flush   Vital Signs    Vitals:   02/28/19 0148 02/28/19 0420 02/28/19 0701 02/28/19 0757  BP: (!) 93/50 (!) 81/57 (!) 82/50 (!) 78/46  Pulse: 100 (!) 103 (!) 106 (!) 110  Resp: 16 20  19   Temp:  97.6 F (36.4 C)  98.4 F (36.9 C)  TempSrc:  Oral  Oral  SpO2: 100% 94% 100% 100%    Intake/Output Summary (Last 24 hours) at 02/28/2019 0811 Last data filed at 02/28/2019 0300 Gross per 24 hour  Intake 1410.86 ml  Output 0 ml  Net 1410.86 ml   Last 3 Weights 02/05/2019 02/05/2019 02/05/2019  Weight (lbs) 141 lb 1.5 oz 141 lb 1.5 oz 147 lb 14.9 oz  Weight (kg) 64 kg 64 kg 67.1 kg      Telemetry    Atrial fibrillation, no recurrence of VT - Personally Reviewed  ECG    4/12 SR with 1st degree AVB, ST changes no longer present from admission EKG - Personally Reviewed  Physical Exam  Somnolent, arousable, chronically ill-appearing male GEN: No acute distress.   Neck:  JVP is elevated Cardiac:  Irregular  with grade 3/6 holosystolic murmur at the left lower sternal Respiratory: Clear to auscultation bilaterally. GI: Soft, nontender, non-distended  MS: No edema; No deformity.  The left arm is wrapped in an Ace bandage Neuro:  Nonfocal  Skin: Diffuse petechial rash upper and lower extremities, feet are warm  Labs    Chemistry Recent Labs  Lab 02/26/19 1253 02/26/19 2116 02/27/19 1119 02/28/19 0027  NA 131* 131* 134* 136  K 4.5 4.8 3.5 3.8  CL 89* 92* 96* 95*  CO2 19* 20* 24 24  GLUCOSE 67* 70 103* 84  BUN 71* 77* 21* 29*  CREATININE 12.17* 12.06* 5.58* 6.47*  CALCIUM 9.5 9.1 7.9* 8.5*  PROT 8.0  --  6.7  --   ALBUMIN 3.3*  --  2.7*  --   AST 46*  --  59*  --   ALT 20  --  21  --   ALKPHOS 146*  --  106  --   BILITOT 1.5*  --  1.5*  --   GFRNONAA 4* 4* 10* 9*  GFRAA 5* 5* 12* 10*  ANIONGAP 23* 19* 14 17*     Hematology Recent Labs  Lab 02/26/19 1253 02/27/19 1119 02/27/19 2035 02/28/19 8088  WBC 13.7* 15.3*  --  15.7*  RBC 3.55* 2.72*  --  3.05*  HGB 9.7* 7.5* 8.5* 8.5*  HCT 28.5* 21.5* 25.6* 24.7*  MCV 80.3 79.0*  --  81.0  MCH 27.3 27.6  --  27.9  MCHC 34.0 34.9  --  34.4  RDW 15.9* 15.6*  --  16.7*  PLT 213 188  --  169    Cardiac Enzymes Recent Labs  Lab 02/26/19 2116 02/27/19 1119  TROPONINI 0.04* 0.11*   No results for input(s): TROPIPOC in the last 168 hours.   BNPNo results for input(s): BNP, PROBNP in the last 168 hours.   DDimer No results for input(s): DDIMER in the last 168 hours.   Radiology    Ct Abdomen Pelvis Wo Contrast  Result Date: 02/26/2019 CLINICAL DATA:  Abdominal pain, acute. EXAM: CT ABDOMEN AND PELVIS WITHOUT CONTRAST TECHNIQUE: Multidetector CT imaging of the abdomen and pelvis was performed following the standard protocol without IV contrast. COMPARISON:  CT abdomen dated 07/13/2017. FINDINGS: Lower chest: RIGHT pleural effusion, at least moderate in size, incompletely imaged. Rounded consolidation within the RIGHT lower  lobe, atelectasis versus pneumonia. LEFT lung base is clear. Cardiomegaly, incompletely imaged. Hepatobiliary: Cirrhotic appearing liver. Cholelithiasis without evidence of acute cholecystitis. No bile duct dilatation seen. Pancreas: Unremarkable. No pancreatic ductal dilatation or surrounding inflammatory changes. Spleen: Mild splenomegaly. Otherwise unremarkable. Adrenals/Urinary Tract: Staghorn calculus within the LEFT renal pelvis. No associated hydronephrosis. RIGHT kidney is unremarkable without stone or hydronephrosis. Bladder is unremarkable, partially decompressed. Stomach/Bowel: No dilated large or small bowel loops. Characterization of the bowel is limited by patient motion artifact, however, there is no pericolonic fluid or other secondary signs of active bowel wall inflammation. Moderate amount of gas and stool within the colon. Moderate amount of gas and fluid within the nondistended stomach. Vascular/Lymphatic: Aortic atherosclerosis. No enlarged lymph nodes seen. Reproductive: Prostate is unremarkable. Other: No free fluid or abscess collection seen. No free intraperitoneal air seen. Musculoskeletal: No acute or suspicious osseous finding. Chronic appearing compression deformities of the T11, L3 and L4 vertebral bodies. IMPRESSION: 1. New RIGHT pleural effusion, at least moderate in size, incompletely imaged. New rounded consolidation within the right lower lobe, atelectasis versus pneumonia. 2. No acute findings within the abdomen or pelvis. No bowel obstruction or evidence of bowel wall inflammation. No evidence of acute solid organ abnormality. No free fluid or abscess collection seen. No free intraperitoneal air. 3. Cirrhotic appearing liver. 4. Cholelithiasis without evidence of acute cholecystitis. 5. Staghorn calculus within the left renal pelvis. No associated hydronephrosis. 6. Additional chronic/incidental findings detailed above. Aortic Atherosclerosis (ICD10-I70.0). Electronically Signed    By: Franki Cabot M.D.   On: 02/26/2019 21:05   Dg Wrist 2 Views Left  Result Date: 02/26/2019 CLINICAL DATA:  58 year old who fell and injured the LEFT wrist approximately 1 month ago. Persistent pain. Subsequent encounter. EXAM: LEFT WRIST - 2 VIEW COMPARISON:  02/03/2019. FINDINGS: Subacute comminuted and impacted intra-articular fracture involving the distal radius into multiple fragments, unchanged from the 02/03/2019 examination with the exception of perhaps slightly more impaction currently. Stable comminuted fracture involving the distal ulna, including the ulnar styloid. No new interval fractures. Radiocarpal joint space narrowing. Possible VISI deformity of the wrist. IMPRESSION: 1. Subacute comminuted and impacted intra-articular fracture involving the distal radius into multiple fragments, unchanged since the 02/03/19 examination with the exception of perhaps slightly more impaction currently. 2. Stable comminuted fracture involving the distal ulna, including the ulnar styloid. 3. No new interval  fractures. 4. Possible VISI deformity of the wrist. Electronically Signed   By: Evangeline Dakin M.D.   On: 02/26/2019 13:45   Ct Head Wo Contrast  Result Date: 02/26/2019 CLINICAL DATA:  Altered mental status. EXAM: CT HEAD WITHOUT CONTRAST TECHNIQUE: Contiguous axial images were obtained from the base of the skull through the vertex without intravenous contrast. COMPARISON:  CT scan of February 03, 2019. FINDINGS: Brain: No evidence of acute infarction, hemorrhage, hydrocephalus, extra-axial collection or mass lesion/mass effect. Vascular: No hyperdense vessel or unexpected calcification. Skull: Normal. Negative for fracture or focal lesion. Sinuses/Orbits: No acute finding. Other: None. IMPRESSION: No acute intracranial abnormality seen. Electronically Signed   By: Marijo Conception, M.D.   On: 02/26/2019 16:38   Dg Chest Port 1 View  Result Date: 02/26/2019 CLINICAL DATA:  Tachycardia EXAM: PORTABLE  CHEST 1 VIEW COMPARISON:  02/03/2019 FINDINGS: Moderate cardiomegaly. Vascular congestion. Stable right pleural effusion and right basilar opacities. Minimal subsegmental atelectasis at the left base. Upper lungs clear. No pneumothorax. IMPRESSION: Stable vascular congestion. Stable right pleural effusion and basilar pulmonary opacities. Electronically Signed   By: Marybelle Killings M.D.   On: 02/26/2019 13:24    Cardiac Studies   2D echocardiogram 02/27/2019: IMPRESSIONS    1. The left ventricle has normal systolic function with an ejection fraction of 60-65%. The cavity size was normal, with D-shaped septum. There is mildly increased left ventricular wall thickness. Left ventricular diastolic function could not be  evaluated due to mitral valve replacement/repair. There is right ventricular volume and pressure overload.  2. The right ventricle has moderately reduced systolic function. The cavity was severely enlarged. There is no increase in right ventricular wall thickness. Right ventricular systolic pressure is severely elevated with an estimated pressure of 55.4  mmHg.  3. A bioprosthetic valve is present in the mitral position. Procedure Date: 2012 There is no Echo evidence of rocking, dehiscence or vegetation of the mitral prosthesis, though images are suboptimal due to suboptimal acoustic windows.  4. MV mean prosthetic diastolic VZCH:8.8 mmHg at HR 104 BPM.  5. Status post tricuspid valve repair. 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 Incomplete coaptation of tricuspid valve leaflets.  6. Tricuspid valve diastolic mean gradient 2.6 mmHg at HR 121 bpm.  7. 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  8. Severe biatrial enlargement.  9. The inferior vena cava was dilated in size with <50% respiratory variability. 10. No definite valvular vegetations seen.   Patient Profile     58 y.o. 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 Wide complex tachycardia: morphology was reported similar to his baseline RBBB though wider. Dr Harl Bowie suggested for possible EP input to look at strips. Unclear if VT vs SVT with aberrance - K+ 3.8, Mg+ 2.2 - trop 0.04-->0.11. No reported chest pain. - 07/2018 echo LVEF 55-60%, no WMAs. Mild MS, PASP 66. Repeat echo this admission with normal EF, stable MV position with severe tricuspid valve regurg 2/2 to RV enlargement. Severe bi-atrial enlargement. - started on amiodarone gtt, now transitioned to 400mg  BID (started on 4/12).   2. Afib: on amidoarone, lopressor,  and coumadin at home. INR 4.1 on admission>>3.3 -- on amio 400mg  BID, continue lopressor. -- Supratherapeutic INR, coumadin on hold.   3. Bioprosthetic MV: valve in stable position with mean grad 9.59mmHg at time of echo with elevated HR.  4. CAD: known RPDA CTO from cath 10/18. Troponin trend 0.04>>0.11. Suspect demand ischemia in the setting of tachycardia, and hypotension.  5. ESRD on HD: - per nephrology  6. Oral bleeding: supratherapetuic INR on admission and anemia. Coumadin on hold.   7. Metabolic encephalopathy with MSSA/Strep bacteremia: ID following. Work up for source? Consideration of HD fistula, echo without noted vegetation on prosthetic. Lactic Acid 3.0>>2.7.  - antibiotics per primary team  For questions or updates, please contact [place] Please consult [email or web address] for contact info under     Signed, Reino Bellis, NP  02/28/2019, 8:11 AM    Patient seen, examined. Available  data reviewed. Agree with findings, assessment, and plan as outlined by Reino Bellis, NP-C.  The exam findings outlined above reflect my personal exam of this patient.  I was able to speak with his daughter over the telephone and update her on his condition.  The patient has MSSA sepsis with associated hypotension/septic shock.  His lactate level is mildly elevated but stable.  He has severe underlying comorbid medical conditions including end-stage renal disease and right heart failure.  I have personally reviewed his echo study which demonstrates essentially normal function of his mitral bioprosthesis.  There are elevated transvalvular gradients which would be expected in a patient who is tachycardic with an active infection.  There are no regurgitant lesions identified and there is no clear evidence of endocarditis, albeit with limited sensitivity of the chest wall echocardiogram.  The patient does have severe tricuspid regurgitation which is likely secondary to annular dilatation and this is been documented on previous echo studies.  I do not think the patient is currently a good candidate for a transesophageal echo because of risk of worsening hypotension and respiratory compromise.  I have reviewed the notes of infectious disease and agree that treating him presumptively for infectious endocarditis might be the most prudent approach.  The patient's heart rate appears stable and I would recommend continuing oral amiodarone.  I am going to discontinue metoprolol because of ongoing hypotension.  The patient's overall prognosis appears to be extremely guarded.  Sherren Mocha, M.D. 02/28/2019 11:37 AM

## 2019-02-28 NOTE — Progress Notes (Signed)
Subjective:  Did have quite a tender left wrist when I palpated it  Antibiotics:  Anti-infectives (From admission, onward)   Start     Dose/Rate Route Frequency Ordered Stop   03/02/19 1800  ceFAZolin (ANCEF) IVPB 2g/100 mL premix     2 g 200 mL/hr over 30 Minutes Intravenous Every M-W-F-Sa (1800) 02/28/19 1036     02/28/19 1400  ceFAZolin (ANCEF) IVPB 2g/100 mL premix     2 g 200 mL/hr over 30 Minutes Intravenous  Once 02/28/19 1033     02/28/19 1200  vancomycin (VANCOCIN) IVPB 750 mg/150 ml premix  Status:  Discontinued     750 mg 150 mL/hr over 60 Minutes Intravenous Every M-W-F (Hemodialysis) 02/27/19 1223 02/27/19 1258   02/27/19 1315  vancomycin (VANCOCIN) 1,250 mg in sodium chloride 0.9 % 250 mL IVPB  Status:  Discontinued     1,250 mg 166.7 mL/hr over 90 Minutes Intravenous  Once 02/27/19 1222 02/27/19 1258   02/27/19 1200  cefTRIAXone (ROCEPHIN) 2 g in sodium chloride 0.9 % 100 mL IVPB  Status:  Discontinued     2 g 200 mL/hr over 30 Minutes Intravenous Every 24 hours 02/27/19 1108 02/28/19 1033      Medications: Scheduled Meds:  amiodarone  400 mg Oral BID   calcium acetate  667 mg Oral TID WC   Chlorhexidine Gluconate Cloth  6 each Topical Q0600   doxercalciferol  1 mcg Intravenous Q M,W,F-HD   fluticasone  2 spray Each Nare Daily   folic acid  1 mg Oral Daily   loratadine  10 mg Oral Daily   metoprolol tartrate  12.5 mg Oral BID   midodrine  5 mg Oral TID WC   sodium chloride flush  3 mL Intravenous Q12H   Continuous Infusions:   ceFAZolin (ANCEF) IV     [START ON 03/02/2019]  ceFAZolin (ANCEF) IV     PRN Meds:.hydrALAZINE, lidocaine, midodrine, ondansetron **OR** ondansetron (ZOFRAN) IV, oxyCODONE, sodium chloride flush    Objective: Weight change:   Intake/Output Summary (Last 24 hours) at 02/28/2019 1124 Last data filed at 02/28/2019 1010 Gross per 24 hour  Intake 1770.86 ml  Output 0 ml  Net 1770.86 ml   Blood pressure (!)  70/35, pulse (!) 110, temperature 98.4 F (36.9 C), temperature source Oral, resp. rate 20, SpO2 100 %. Temp:  [97.5 F (36.4 C)-99.6 F (37.6 C)] 98.4 F (36.9 C) (04/13 0757) Pulse Rate:  [93-110] 110 (04/13 0800) Resp:  [14-26] 20 (04/13 0941) BP: (70-101)/(35-61) 70/35 (04/13 1014) SpO2:  [87 %-100 %] 100 % (04/13 0800)  Physical Exam: General: Fatigued appearing but arousable HEENT: anicteric sclera, EOMI CVS regular rate, normal , no murmurs gallops or rubs heard  chest: , no wheezing, no respiratory distress, lungs are fairly clear Abdomen: soft non-distended,  Extremities: Left wrist with tenderness to palpation is wrapped  skin: Extensive rash as documented yesterday. Neuro: nonfocal  CBC:    BMET Recent Labs    02/27/19 1119 02/28/19 0027  NA 134* 136  K 3.5 3.8  CL 96* 95*  CO2 24 24  GLUCOSE 103* 84  BUN 21* 29*  CREATININE 5.58* 6.47*  CALCIUM 7.9* 8.5*     Liver Panel  Recent Labs    02/26/19 1253 02/27/19 1119  PROT 8.0 6.7  ALBUMIN 3.3* 2.7*  AST 46* 59*  ALT 20 21  ALKPHOS 146* 106  BILITOT 1.5* 1.5*  Sedimentation Rate No results for input(s): ESRSEDRATE in the last 72 hours. C-Reactive Protein No results for input(s): CRP in the last 72 hours.  Micro Results: Recent Results (from the past 720 hour(s))  Blood culture (routine x 2)     Status: None   Collection Time: 02/03/19  6:50 PM  Result Value Ref Range Status   Specimen Description BLOOD RIGHT HAND  Final   Special Requests   Final    BOTTLES DRAWN AEROBIC AND ANAEROBIC Blood Culture adequate volume   Culture   Final    NO GROWTH 5 DAYS Performed at [facility], [address], [place], [place] [address]    Report Status 02/08/2019 FINAL  Final  MRSA PCR Screening     Status: None   Collection Time: 02/03/19  8:37 PM  Result Value Ref Range Status   MRSA by PCR NEGATIVE NEGATIVE Final    Comment:        The GeneXpert MRSA Assay (FDA approved for NASAL  specimens only), is one component of a comprehensive MRSA colonization surveillance program. It is not intended to diagnose MRSA infection nor to guide or monitor treatment for MRSA infections. Performed at [facility], [place] [address] [place], [place] [address]   Culture, blood (Routine X 2) w Reflex to ID Panel     Status: Abnormal (Preliminary result)   Collection Time: 02/26/19  9:00 PM  Result Value Ref Range Status   Specimen Description BLOOD RIGHT HAND  Final   Special Requests   Final    BOTTLES DRAWN AEROBIC ONLY Blood Culture results may not be optimal due to an inadequate volume of blood received in culture bottles   Culture  Setup Time   Final    GRAM POSITIVE COCCI AEROBIC BOTTLE ONLY CRITICAL VALUE NOTED.  VALUE IS CONSISTENT WITH PREVIOUSLY REPORTED AND CALLED VALUE.    Culture (A)  Final    STAPHYLOCOCCUS AUREUS SUSCEPTIBILITIES TO FOLLOW Performed at [facility], [place] [address], [place], [place] [address]    Report Status PENDING  Incomplete  Culture, blood (Routine X 2) w Reflex to ID Panel     Status: Abnormal (Preliminary result)   Collection Time: 02/26/19  9:05 PM  Result Value Ref Range Status   Specimen Description BLOOD RIGHT FOREARM  Final   Special Requests   Final    BOTTLES DRAWN AEROBIC ONLY Blood Culture adequate volume   Culture  Setup Time   Final    GRAM POSITIVE COCCI AEROBIC BOTTLE ONLY CRITICAL RESULT CALLED TO, READ BACK BY AND VERIFIED WITH: PHARMD M [place] [id number] AT 1054 AM BY CM    Culture (A)  Final    STAPHYLOCOCCUS AUREUS SUSCEPTIBILITIES TO FOLLOW Performed at [facility], [place] [address], [place], [place] [address]    Report Status PENDING  Incomplete  Blood Culture ID Panel (Reflexed)     Status: Abnormal   Collection Time: 02/26/19  9:05 PM  Result Value Ref Range Status   Enterococcus species NOT DETECTED NOT DETECTED Final   Listeria monocytogenes NOT DETECTED NOT DETECTED Final   Staphylococcus  species DETECTED (A) NOT DETECTED Final    Comment: CRITICAL RESULT CALLED TO, READ BACK BY AND VERIFIED WITH: [place] [id number] AT 1056 AM BY CM    Staphylococcus aureus (BCID) DETECTED (A) NOT DETECTED Final    Comment: Methicillin (oxacillin) susceptible Staphylococcus aureus (MSSA). Preferred therapy is anti staphylococcal beta lactam antibiotic (Cefazolin or  Nafcillin), unless clinically contraindicated. CRITICAL RESULT CALLED TO, READ BACK BY AND VERIFIED WITH: PHARMD M [place] [id number] AT 36 AM BY CM    Methicillin resistance NOT DETECTED NOT DETECTED Final   Streptococcus species DETECTED (A) NOT DETECTED Final    Comment: Not Enterococcus species, Streptococcus agalactiae, Streptococcus pyogenes, or Streptococcus pneumoniae. CRITICAL RESULT CALLED TO, READ BACK BY AND VERIFIED WITH: PHARMD M [place] [id number] AT 55 AM BY CM    Streptococcus agalactiae NOT DETECTED NOT DETECTED Final   Streptococcus pneumoniae NOT DETECTED NOT DETECTED Final   Streptococcus pyogenes NOT DETECTED NOT DETECTED Final   Acinetobacter baumannii NOT DETECTED NOT DETECTED Final   Enterobacteriaceae species NOT DETECTED NOT DETECTED Final   Enterobacter cloacae complex NOT DETECTED NOT DETECTED Final   Escherichia coli NOT DETECTED NOT DETECTED Final   Klebsiella oxytoca NOT DETECTED NOT DETECTED Final   Klebsiella pneumoniae NOT DETECTED NOT DETECTED Final   Proteus species NOT DETECTED NOT DETECTED Final   Serratia marcescens NOT DETECTED NOT DETECTED Final   Haemophilus influenzae NOT DETECTED NOT DETECTED Final   Neisseria meningitidis NOT DETECTED NOT DETECTED Final   Pseudomonas aeruginosa NOT DETECTED NOT DETECTED Final   Candida albicans NOT DETECTED NOT DETECTED Final   Candida glabrata NOT DETECTED NOT DETECTED Final   Candida krusei NOT DETECTED NOT DETECTED Final   Candida parapsilosis NOT DETECTED NOT DETECTED Final   Candida tropicalis NOT DETECTED NOT DETECTED Final    Comment:  Performed at [facility], [address], [place], [place] [address]    Studies/Results: Ct Abdomen Pelvis Wo Contrast  Result Date: 02/26/2019 CLINICAL DATA:  Abdominal pain, acute. EXAM: CT ABDOMEN AND PELVIS WITHOUT CONTRAST TECHNIQUE: Multidetector CT imaging of the abdomen and pelvis was performed following the standard protocol without IV contrast. COMPARISON:  CT abdomen dated 07/13/2017. FINDINGS: Lower chest: RIGHT pleural effusion, at least moderate in size, incompletely imaged. Rounded consolidation within the RIGHT lower lobe, atelectasis versus pneumonia. LEFT lung base is clear. Cardiomegaly, incompletely imaged. Hepatobiliary: Cirrhotic appearing liver. Cholelithiasis without evidence of acute cholecystitis. No bile duct dilatation seen. Pancreas: Unremarkable. No pancreatic ductal dilatation or surrounding inflammatory changes. Spleen: Mild splenomegaly. Otherwise unremarkable. Adrenals/Urinary Tract: Staghorn calculus within the LEFT renal pelvis. No associated hydronephrosis. RIGHT kidney is unremarkable without stone or hydronephrosis. Bladder is unremarkable, partially decompressed. Stomach/Bowel: No dilated large or small bowel loops. Characterization of the bowel is limited by patient motion artifact, however, there is no pericolonic fluid or other secondary signs of active bowel wall inflammation. Moderate amount of gas and stool within the colon. Moderate amount of gas and fluid within the nondistended stomach. Vascular/Lymphatic: Aortic atherosclerosis. No enlarged lymph nodes seen. Reproductive: Prostate is unremarkable. Other: No free fluid or abscess collection seen. No free intraperitoneal air seen. Musculoskeletal: No acute or suspicious osseous finding. Chronic appearing compression deformities of the T11, L3 and L4 vertebral bodies. IMPRESSION: 1. New RIGHT pleural effusion, at least moderate in size, incompletely imaged. New rounded consolidation within the right lower  lobe, atelectasis versus pneumonia. 2. No acute findings within the abdomen or pelvis. No bowel obstruction or evidence of bowel wall inflammation. No evidence of acute solid organ abnormality. No free fluid or abscess collection seen. No free intraperitoneal air. 3. Cirrhotic appearing liver. 4. Cholelithiasis without evidence of acute cholecystitis. 5. Staghorn calculus within the left renal pelvis. No associated hydronephrosis. 6. Additional chronic/incidental findings detailed above. Aortic Atherosclerosis (ICD10-I70.0). Electronically Signed   By: [name].D.  On: 02/26/2019 21:05   Dg Wrist 2 Views Left  Result Date: 02/26/2019 CLINICAL DATA:  58 year old who fell and injured the LEFT wrist approximately 1 month ago. Persistent pain. Subsequent encounter. EXAM: LEFT WRIST - 2 VIEW COMPARISON:  02/03/2019. FINDINGS: Subacute comminuted and impacted intra-articular fracture involving the distal radius into multiple fragments, unchanged from the 02/03/2019 examination with the exception of perhaps slightly more impaction currently. Stable comminuted fracture involving the distal ulna, including the ulnar styloid. No new interval fractures. Radiocarpal joint space narrowing. Possible VISI deformity of the wrist. IMPRESSION: 1. Subacute comminuted and impacted intra-articular fracture involving the distal radius into multiple fragments, unchanged since the 02/03/19 examination with the exception of perhaps slightly more impaction currently. 2. Stable comminuted fracture involving the distal ulna, including the ulnar styloid. 3. No new interval fractures. 4. Possible VISI deformity of the wrist. Electronically Signed   By: Evangeline Dakin M.D.   On: 02/26/2019 13:45   Ct Head Wo Contrast  Result Date: 02/26/2019 CLINICAL DATA:  Altered mental status. EXAM: CT HEAD WITHOUT CONTRAST TECHNIQUE: Contiguous axial images were obtained from the base of the skull through the vertex without intravenous  contrast. COMPARISON:  CT scan of February 03, 2019. FINDINGS: Brain: No evidence of acute infarction, hemorrhage, hydrocephalus, extra-axial collection or mass lesion/mass effect. Vascular: No hyperdense vessel or unexpected calcification. Skull: Normal. Negative for fracture or focal lesion. Sinuses/Orbits: No acute finding. Other: None. IMPRESSION: No acute intracranial abnormality seen. Electronically Signed   By: Marijo Conception, M.D.   On: 02/26/2019 16:38   Dg Chest Port 1 View  Result Date: 02/26/2019 CLINICAL DATA:  Tachycardia EXAM: PORTABLE CHEST 1 VIEW COMPARISON:  02/03/2019 FINDINGS: Moderate cardiomegaly. Vascular congestion. Stable right pleural effusion and right basilar opacities. Minimal subsegmental atelectasis at the left base. Upper lungs clear. No pneumothorax. IMPRESSION: Stable vascular congestion. Stable right pleural effusion and basilar pulmonary opacities. Electronically Signed   By: Marybelle Killings M.D.   On: 02/26/2019 13:24      Assessment/Plan:  INTERVAL HISTORY: Patient having tenuous blood pressures overnight and today  Active Problems:   Oral bleeding   Metabolic encephalopathy    Michael [name] is a 58 y.o. 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  Specific aspects of clinical management are critical to optimizing the outcome of patients with SAB.  Therefore, the [facility] Antimicrobial Management Team [facility] has initiated an intervention aimed at improving the management of SAB at [facility].  To do so, Infectious Diseases physicians are  providing an evidence-based consult for the management of all patients with SAB.     Yes No Comments  Perform follow-up blood cultures (even if the patient is afebrile) to ensure clearance of bacteremia [x]  []    Remove vascular catheter and obtain follow-up blood cultures after the removal of the catheter []  []    Perform echocardiography to evaluate for endocarditis (transthoracic ECHO is 40-50% sensitive, TEE is > 90% sensitive) [x]  []   no vegetations by 2D echocardiogram  Consult electrophysiologist to evaluate implanted cardiac device (pacemaker, ICD) []  []   N/A  Ensure  source control []  []  Have all abscesses been drained effectively? Have deep seeded infections (septic joints or osteomyelitis) had appropriate surgical debridement?  Have concerns that site of wrist fracture could be infected, will get MRI of wrist when able to do so  Also monitor fistula  Investigate for metastatic sites of infection []  []  Does the patient have ANY symptom or physical exam finding that would suggest a deeper infection (back or neck pain that may be suggestive of vertebral osteomyelitis or epidural abscess, muscle pain that could be a symptom of pyomyositis)?  Keep in mind that for deep seeded infections MRI imaging with contrast is preferred rather than other often insensitive tests such as plain x-rays, especially early in a patient's presentation.    See above regarding concerns of wrist being infected  Also consider diagnostic thoracocentesis  Change antibiotic therapy to cefazolin but will add gentamicin and rifampin tomorrow if we have had no growth of any organisms on cultures at 24 hours []  []  Beta-lactam antibiotics are preferred for MSSA due to higher cure rates.   If on Vancomycin, goal trough should be 15 - 20 mcg/mL  Estimated duration of IV antibiotic therapy: 6 weeks of vancomycin and rifampin with 2 weeks of gentamicin []  []  Consult case management for probably prolonged outpatient IV  antibiotic therapy   #2 Wrist fracture: Will get an MRI when he is capable of having 1 done.  #3 Rash: Seems fairly stable not sure if this is related to his bacteremia or is a chronic problem   LOS: 2 days   Alcide Evener 02/28/2019, 11:24 AM

## 2019-02-28 NOTE — Progress Notes (Signed)
500cc normal saline  bolus given, BP 70/35 HR 104. Chiu MD notified, patient very drowsy. Patient woken up to eat breakfast and went back to sleep after eating, patient now resting quietly in bed with eyes closed, no sign of distress observed, will continue to  monitor

## 2019-02-28 NOTE — Progress Notes (Signed)
0010: paged on call triad as pt's BP was still on 70's.  Michael [name] called back, received order for 250 ml bolus.  [name] came down here by bedside to assess patient, stated if his BP still low will notify critical care. Pt is not symptomatic currently. NS bolus running. Will continue to monitor.

## 2019-02-28 NOTE — Progress Notes (Signed)
Called Charles Bodenhimer Triad NP and notified pt's BP is now 93/50 and lactic acid 2.7.

## 2019-03-01 ENCOUNTER — Encounter (HOSPITAL_COMMUNITY): Payer: Self-pay | Admitting: Radiology

## 2019-03-01 ENCOUNTER — Inpatient Hospital Stay (HOSPITAL_COMMUNITY): Payer: Medicare Other

## 2019-03-01 DIAGNOSIS — T826XXA Infection and inflammatory reaction due to cardiac valve prosthesis, initial encounter: Secondary | ICD-10-CM

## 2019-03-01 DIAGNOSIS — R011 Cardiac murmur, unspecified: Secondary | ICD-10-CM

## 2019-03-01 DIAGNOSIS — S62102D Fracture of unspecified carpal bone, left wrist, subsequent encounter for fracture with routine healing: Secondary | ICD-10-CM

## 2019-03-01 DIAGNOSIS — I120 Hypertensive chronic kidney disease with stage 5 chronic kidney disease or end stage renal disease: Secondary | ICD-10-CM

## 2019-03-01 DIAGNOSIS — I480 Paroxysmal atrial fibrillation: Secondary | ICD-10-CM

## 2019-03-01 DIAGNOSIS — I38 Endocarditis, valve unspecified: Secondary | ICD-10-CM

## 2019-03-01 DIAGNOSIS — X58XXXD Exposure to other specified factors, subsequent encounter: Secondary | ICD-10-CM

## 2019-03-01 LAB — BASIC METABOLIC PANEL
Anion gap: 14 (ref 5–15)
BUN: 43 mg/dL — ABNORMAL HIGH (ref 6–20)
CO2: 25 mmol/L (ref 22–32)
Calcium: 8.7 mg/dL — ABNORMAL LOW (ref 8.9–10.3)
Chloride: 96 mmol/L — ABNORMAL LOW (ref 98–111)
Creatinine, Ser: 8.02 mg/dL — ABNORMAL HIGH (ref 0.61–1.24)
GFR calc Af Amer: 8 mL/min — ABNORMAL LOW (ref >60)
GFR calc non Af Amer: 7 mL/min — ABNORMAL LOW (ref >60)
Glucose, Bld: 113 mg/dL — ABNORMAL HIGH (ref 70–99)
Potassium: 3.6 mmol/L (ref 3.5–5.1)
Sodium: 135 mmol/L (ref 135–145)

## 2019-03-01 LAB — CULTURE, BLOOD (ROUTINE X 2): Special Requests: ADEQUATE

## 2019-03-01 LAB — CBC
HCT: 24.4 % — ABNORMAL LOW (ref 39.0–52.0)
Hemoglobin: 8.5 g/dL — ABNORMAL LOW (ref 13.0–17.0)
MCH: 27.4 pg (ref 26.0–34.0)
MCHC: 34.8 g/dL (ref 30.0–36.0)
MCV: 78.7 fL — ABNORMAL LOW (ref 80.0–100.0)
Platelets: 178 10*3/uL (ref 150–400)
RBC: 3.1 MIL/uL — ABNORMAL LOW (ref 4.22–5.81)
RDW: 16.5 % — ABNORMAL HIGH (ref 11.5–15.5)
WBC: 12.9 10*3/uL — ABNORMAL HIGH (ref 4.0–10.5)
nRBC: 0.2 % (ref 0.0–0.2)

## 2019-03-01 LAB — PROTIME-INR
INR: 2.1 — ABNORMAL HIGH (ref 0.8–1.2)
Prothrombin Time: 23 seconds — ABNORMAL HIGH (ref 11.4–15.2)

## 2019-03-01 MED ORDER — MIDODRINE HCL 5 MG PO TABS
ORAL_TABLET | ORAL | Status: AC
  Filled 2019-03-01: qty 2

## 2019-03-01 MED ORDER — WARFARIN - PHARMACIST DOSING INPATIENT: Freq: Every day | Status: DC

## 2019-03-01 MED ORDER — GENTAMICIN SULFATE 40 MG/ML IJ SOLN
190.0000 mg | Freq: Once | INTRAVENOUS | Status: AC
  Administered 2019-03-01: 190 mg via INTRAVENOUS
  Filled 2019-03-01: qty 4.75

## 2019-03-01 MED ORDER — CEFAZOLIN SODIUM-DEXTROSE 2-4 GM/100ML-% IV SOLN
2.0000 g | Freq: Once | INTRAVENOUS | Status: AC
  Administered 2019-03-01: 18:00:00 2 g via INTRAVENOUS
  Filled 2019-03-01: qty 100

## 2019-03-01 MED ORDER — GENTAMICIN SULFATE 40 MG/ML IJ SOLN
190.0000 mg | Freq: Once | INTRAVENOUS | Status: DC
  Filled 2019-03-01: qty 4.75

## 2019-03-01 MED ORDER — GENTAMICIN SULFATE 40 MG/ML IJ SOLN
130.0000 mg | INTRAVENOUS | Status: DC
  Filled 2019-03-01: qty 3.25

## 2019-03-01 MED ORDER — WARFARIN SODIUM 2.5 MG PO TABS
2.5000 mg | ORAL_TABLET | Freq: Once | ORAL | Status: AC
  Administered 2019-03-01: 2.5 mg via ORAL
  Filled 2019-03-01 (×2): qty 1

## 2019-03-01 MED ORDER — CEFAZOLIN SODIUM-DEXTROSE 2-4 GM/100ML-% IV SOLN
2.0000 g | INTRAVENOUS | Status: DC
  Filled 2019-03-01: qty 100

## 2019-03-01 MED ORDER — RIFAMPIN 300 MG PO CAPS
300.0000 mg | ORAL_CAPSULE | Freq: Three times a day (TID) | ORAL | Status: DC
  Administered 2019-03-01 – 2019-03-06 (×15): 300 mg via ORAL
  Filled 2019-03-01 (×16): qty 1

## 2019-03-01 NOTE — Progress Notes (Signed)
Progress Note  Patient Name: Michael [name] Date of Encounter: 03/01/2019   Primary Cardiologist: [name], MD   Subjective   Continued episodes of hypotension overnight despite fluid resuscitation.   Inpatient Medications    Scheduled Meds: . amiodarone  400 mg Oral BID  . calcium acetate  667 mg Oral TID WC  . Chlorhexidine Gluconate Cloth  6 each Topical Q0600  . doxercalciferol  1 mcg Intravenous Q M,W,F-HD  . fluticasone  2 spray Each Nare Daily  . folic acid  1 mg Oral Daily  . loratadine  10 mg Oral Daily  . midodrine  10 mg Oral TID WC  . sodium chloride flush  3 mL Intravenous Q12H   Continuous Infusions: . [START ON 03/02/2019]  ceFAZolin (ANCEF) IV     PRN Meds: acetaminophen, hydrALAZINE, lidocaine, midodrine, ondansetron **OR** ondansetron (ZOFRAN) IV, oxyCODONE, sodium chloride flush   Vital Signs    Vitals:   03/01/19 0536 03/01/19 0720 03/01/19 0734 03/01/19 0739  BP: (!) 79/53 (!) 78/38 (!) 74/50 (!) 85/50  Pulse: (!) 108 (!) 110 (!) 108 (!) 101  Resp: 13 14 14 14   Temp: 97.9 F (36.6 C) (!) 97 F (36.1 C)    TempSrc: Oral Axillary    SpO2: 98% 99% 100% 100%  Weight:  64.2 kg      Intake/Output Summary (Last 24 hours) at 03/01/2019 0839 Last data filed at 03/01/2019 0300 Gross per 24 hour  Intake 2192.21 ml  Output 1 ml  Net 2191.21 ml   Last 3 Weights 03/01/2019 02/28/2019 02/05/2019  Weight (lbs) 141 lb 8.6 oz 141 lb 5 oz 141 lb 1.5 oz  Weight (kg) 64.2 kg 64.1 kg 64 kg    Physical Exam  More alert today.  Evaluated/examined in the hemodialysis unit. GEN: No acute distress.   Neck: No JVD Cardiac: RRR, 2/6 systolic murmur at the left lower sternal border Respiratory: Clear to auscultation bilaterally. GI: Soft, nontender, non-distended  MS: No edema; the legs are tender to palpation over the lower legs/pretibial area Neuro:  Nonfocal  Psych: Responds to simple questions only, there is some language barrier but also the patient  is not interacting in a normal way, mental status is improved from yesterday's examination  Labs    Chemistry Recent Labs  Lab 02/26/19 1253  02/27/19 1119 02/28/19 0027 03/01/19 0413  NA 131*   < > 134* 136 135  K 4.5   < > 3.5 3.8 3.6  CL 89*   < > 96* 95* 96*  CO2 19*   < > 24 24 25   GLUCOSE 67*   < > 103* 84 113*  BUN 71*   < > 21* 29* 43*  CREATININE 12.17*   < > 5.58* 6.47* 8.02*  CALCIUM 9.5   < > 7.9* 8.5* 8.7*  PROT 8.0  --  6.7  --   --   ALBUMIN 3.3*  --  2.7*  --   --   AST 46*  --  59*  --   --   ALT 20  --  21  --   --   ALKPHOS 146*  --  106  --   --   BILITOT 1.5*  --  1.5*  --   --   GFRNONAA 4*   < > 10* 9* 7*  GFRAA 5*   < > 12* 10* 8*  ANIONGAP 23*   < > 14 17* 14   < > = values  in this interval not displayed.     Hematology Recent Labs  Lab 02/27/19 1119 02/27/19 2035 02/28/19 0027 03/01/19 0413  WBC 15.3*  --  15.7* 12.9*  RBC 2.72*  --  3.05* 3.10*  HGB 7.5* 8.5* 8.5* 8.5*  HCT 21.5* 25.6* 24.7* 24.4*  MCV 79.0*  --  81.0 78.7*  MCH 27.6  --  27.9 27.4  MCHC 34.9  --  34.4 34.8  RDW 15.6*  --  16.7* 16.5*  PLT 188  --  169 178    Cardiac Enzymes Recent Labs  Lab 02/26/19 2116 02/27/19 1119  TROPONINI 0.04* 0.11*   No results for input(s): TROPIPOC in the last 168 hours.   BNPNo results for input(s): BNP, PROBNP in the last 168 hours.   DDimer No results for input(s): DDIMER in the last 168 hours.   Radiology    No results found.   Patient Profile     58 y.o. 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 Wide complex  tachycardia: morphology was reported similar to his baseline RBBB though wider. Now resolved. Electrolytes stable. - started on amiodarone gtt, now transitioned to 400mg  BID (started on 4/12).  2. Afib: on amidoarone, lopressor, and coumadin at home. INR 6.0 on admission>>3.3>>2.1 -- on amio 400mg  BID, lopressor stopped in the setting of hypotension. -- INR improving, PharmD following.  3. Bioprosthetic MV: 07/2018 echo LVEF 55-60%, no WMAs. Mild MS, PASP 66. Repeat echo this admission with normal EF, stable MV position with severe tricuspid valve regurg 2/2 to RV enlargement. Severe bi-atrial enlargement. valve in stable position with mean grad 9.57mmHg at time of echo with elevated HR.   4. CAD: known RPDA CTO from cath 10/18. Troponin trend 0.04>>0.11. Suspect demand ischemia in the setting of tachycardia, and hypotension.  5. ESRD on HD: - per nephrology  6. Metabolic encephalopathy with MSSA/Strep bacteremia: ID following. Work up for source? Consideration of HD fistula, also concern over left wrist, endocarditis. echo without noted vegetation on prosthetic. Lactic Acid 3.0>>2.7. With current guarded state, would not be a good candidate for TEE. ID treating appropriately. - antibiotics per primary team/ID.   For questions or updates, please contact [facility] Please consult [email or web address] for contact info under     Signed, Michael Bellis, NP  03/01/2019, 8:39 AM    Patient seen, examined. Available data reviewed. Agree with findings, assessment, and plan as outlined by Michael Bellis, NP.  Exam findings above reflect my personal examination of this patient.  He demonstrates modest improvement compared to yesterday.  His systolic blood pressure is now greater than 80 which reportedly is his baseline.  He appears to be tolerating hemodialysis and is examined in the hemodialysis unit today.  I was able to discuss his case at length with his daughter yesterday.  Plans as outlined in  yesterday's note.  Unclear source of MSSA sepsis, but multiple possibilities (left wrist, hemodialysis access, bioprosthetic heart valve/SBE).  Antibiotic regimen per ID.  Seems reasonable to treat presumptively for endocarditis and I would not recommend pursuing a TEE on this patient with multiple severe comorbidities.  For the patient's arrhythmias, would continue amiodarone 400 mg twice daily  x2 weeks then reduce his dose to 200 mg daily.  His INR has been 2.1 now for 2 days.  Will consult the pharmacist to resume warfarin.  Sherren Mocha, M.D. 03/01/2019 10:34 AM

## 2019-03-01 NOTE — Progress Notes (Signed)
ANTICOAGULATION CONSULT NOTE - Initial Consult  Pharmacy Consult for warfarin Indication: porcine mitral valve and atrial fibrillation  Allergies  Allergen Reactions  . Triamcinolone Other (See Comments)    Felt like skin was burning    Patient Measurements: Weight: 141 lb 8.6 oz (64.2 kg)  Vital Signs: Temp: 97 F (36.1 C) (04/14 0720) Temp Source: Axillary (04/14 0720) BP: 83/45 (04/14 0930) Pulse Rate: 102 (04/14 0930)  Labs: Recent Labs    02/26/19 2116 02/27/19 1119 02/27/19 2035 02/28/19 0027 02/28/19 0924 03/01/19 0413  HGB  --  7.5* 8.5* 8.5*  --  8.5*  HCT  --  21.5* 25.6* 24.7*  --  24.4*  PLT  --  188  --  169  --  178  APTT  --  79*  --   --   --   --   LABPROT  --  33.0*  --   --  23.6* 23.0*  INR  --  3.3*  --   --  2.1* 2.1*  CREATININE 12.06* 5.58*  --  6.47*  --  8.02*  TROPONINI 0.04* 0.11*  --   --   --   --     Estimated Creatinine Clearance: 7.7 mL/min (A) (by C-G formula based on SCr of 8.02 mg/dL (H)).   Medical History: Past Medical History:  Diagnosis Date  . Abdominal pain 02/25/2016  . Acute encephalopathy 08/06/2018  . Acute hypoxemic respiratory failure ([place]) 12/29/2015  . Advance care planning   . Anemia - mild exacerbation 10/15/2015  . Anemia of chronic disease   . Aortic regurgitation 02/27/2016  . Asthma    said he does not know  . Atrial fibrillation with RVR ([place]) 02/19/2016  . Calf pain 09/10/2015  . Chronic kidney disease (CKD), stage IV (severe) ([place])   . Chronic systolic CHF (congestive heart failure) (HCC)    a. prior EF normal; b. Echo 10/16: Mild LVH, EF 30-35%, mitral valve bioprosthesis present without evidence of stenosis or regurgitation, severe LAE, mild RVE with mild to moderately reduced RVSF, moderate RAE  . Cirrhosis ([place])   . Congestive heart failure (CHF) ([place]) 02/25/2016  . Coronary artery disease   . Diabetes mellitus type 2 in obese ([place])   . Diabetes mellitus without complication ([place])   . Dilated  cardiomyopathy ([place]) 12/24/2015  . Dyspnea   . Elevated troponin 11/24/2017  . ESRD (end stage renal disease) on dialysis ([place])   . Fluid overload 11/24/2017  . GERD (gastroesophageal reflux disease)   . Gout 10/23/2015  . H/O tricuspid valve repair 2012  . History of echocardiogram    Echo at [facility] 4/12: mod LVH, EF 60-65%, mod LAE, tissue MVR ok, mod TR, mod pulmo HTN with RVSP Michael mmHg  . HLD (hyperlipidemia) 10/05/2016  . Hyperkalemia 11/24/2017  . Hypertension   . Mitral valve disease   . Obstructive sleep apnea   . Pancreatitis, ruled out 02/25/2016  . Paroxysmal atrial fibrillation (HCC)    s/p Maze procedure at time of MVR in 2011  . Permanent atrial fibrillation 02/25/2016  . Pleural effusion   . Polypharmacy 11/23/2015  . Pulmonary HTN ([place])   . 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 Septic shock ([place])   . Syncope and collapse 08/10/2017  . Thrombocytopenia ([place])   . Viral gastroenteritis 02/27/2016    Assessment: Michael [name] h/o PAF/MVR on warfarin 2.5mg  daily PTA, s/p MAZE and MVR porcine  in 2011 or 2012.  Has had slow, oral bleeding for about a week pta. INR has been elevated now trending down to 2.1. Hgb 8.5, plts wnl.  Goal of Therapy:  INR 2-3 Monitor platelets by anticoagulation protocol: Yes   Plan:  Give warfarin 2.5 mg PO x 1 tonight Monitor daily INR Monitor for s/sx of bleeding  Thank you for allowing pharmacy to be a part of this patient's care.  Elenor Quinones, PharmD, BCPS, BCIDP Clinical Pharmacist 03/01/2019 11:04 AM

## 2019-03-01 NOTE — Progress Notes (Signed)
Pharmacy Antibiotic Note  Michael [name] is a 58 y.o. 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 Pharmacy has been consulted for gentamicin dosing. Patient is ESRD with HD MWFSa. Currently planning on HD Thursday and Saturday this week.   Plan: Gentamicin 3 mg/kg (~190 mg) once  Followed by gentamicin 2 mg/kg (130 mg)  every 48 hours  F/U pre-HD level     Weight: 141 lb 8.6 oz (64.2 kg)  Temp (24hrs), Avg:97.9 F (36.6 C), Min:97 F (36.1 C), Max:98.4 F (36.9 C)  Recent Labs  Lab 02/26/19 1253 02/26/19 2116 02/27/19 1119 02/27/19 2040 02/28/19 0027 03/01/19 0413  WBC 13.7*  --  15.3*  --  15.7* 12.9*  CREATININE 12.17* 12.06* 5.58*  --  6.47* 8.02*  LATICACIDVEN  --   --   --  3.0* 2.7*  --     Estimated Creatinine Clearance: 7.7 mL/min (A) (by C-G formula based on SCr of 8.02 mg/dL (H)).    Allergies  Allergen Reactions  . Triamcinolone Other (See Comments)    Felt like skin was burning    Antimicrobials this admission: 4/13 Ancef>> 4/14 Rifampin >> 4/14 Gent >>    Thank you for allowing pharmacy to be a part of this patient's care.  Jimmy Footman, PharmD, BCPS, BCIDP Infectious Diseases Clinical Pharmacist Phone: [phone] 03/01/2019 1:19 PM

## 2019-03-01 NOTE — Progress Notes (Signed)
Pharmacy is unable to confirm the medications the patient was taking at home. All options have been exhausted and a resolution to the situation is not expected.   Where possible, their outpatient pharmacy(s) have been contacted for the last time prescriptions were filled and that information has been added to each medication in an Order Note (highlighted yellow below the medication).  Please contact pharmacy if further assistance is needed.   Romeo Rabon, PharmD. Mobile: [phone]. 03/01/2019,12:50 PM.

## 2019-03-01 NOTE — Progress Notes (Signed)
PROGRESS NOTE    Michael [name]  WUX:[id number] DOB: [birth year] DOA: 02/26/2019 PCP: Charlott Rakes, MD    Brief Narrative:  58 y.o. 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  I was unable to obtain history from patient, attempted to call daughter x2 with no answers.  History per chart review.  Apparently patient missed dialysis multiple times this week he has had slow, bleeding for about a week and INR was checked and found to be elevated.  Apparently patient was yelling at his house and was found very confused and not making sense.  Level 5 caveat.   ED Course: While in the ED patient is yelling unintelligibly, moving and fidgeting his legs. When addressed directly he said yes to every question.  Lab work-up in the ED shows creatinine of 12.17, BUN 71, glucose 67, ALP 146, WBC 13.7, hemoglobin 9.7, INR 6.0, PT 38.9.  CT of the head with no acute findings.  Rectal temperature 200.2.   Assessment & Plan:   Active Problems:   Oral bleeding   Encephalopathy   Wrist fracture, closed, left, with routine healing, subsequent encounter   Metabolic encephalopathy   Bacteremia   Fracture   Hypoglycemia   MSSA bacteremia   Prosthetic valve endocarditis (HCC)  Acute toxicmetabolic encephalopathy -Blood cx pos for MSSA bacteremia -Possible toxic metabolic encephalopathy related to MSSA bacteremia. See below -Treating for presumptive endocarditis -Continue on HD per Nephrology -Pt on ancef per ID. -Recent 2d echo without vegetations. ID recommendation for MRI of L wrist MRI when more stable to r/o wrist infection -Seen on HD this AM with Nephrologist. Mentation MUCH improved, as is BP (see below)  Abdominal pain -Uncertain  etiology -Abd CT with finding of new R pleural effusion, staghorn renal calculus in the L renal pelvis without hydronephrosis, cirrhosis. -Seems stable at present  Elevated INR -Presenting INR 6.0 -Coumadin on hold -INR today 2.1 -Repeat CBC and INR in AM  PAF with wide complex tachycardia  -Cardiology following. Discussed with Dr. Harl Bowie today -Cardiology to discuss with EP - unclear if VT vs SVT with aberrant rhythm -cont to correct electrolytes -Continued on amiodarone -metoprolol stopped by Cardiology secondary to hypotension (see below) -2d echo ordered, no evidence of vegetations. Per Cardiology, not candidate for TEE given risk for hemodynamic compromise  -Stable at present   Hypertension with chronic hypotension -BP noted to be hypotensive -BP meds including beta blockers held -Was given multiple boluses, now held secondary to swelling -Discussed with Nephrology who knows pt well. Pt's sbp known to be chronically low, sbp typically in the low-80's  Diabetes mellitus type 2, now with hypoglycemia. -Patient is diet controlled at home, will continue to monitor will not add sliding scale at this time -seems stable currently  History of leukocytoclastic vasculitis -Diffuse petechial rash noted throughout the body.   -Stable at present  DVT prophylaxis: SCD Code Status: Full Family Communication: Pt in room, family not at bedside Disposition Plan: Uncertain at this time  Consultants:   Nephrology  Cardiology  ID  Procedures:     Antimicrobials: Anti-infectives (From admission, onward)   Start     Dose/Rate Route Frequency Ordered Stop   03/03/19 2000  gentamicin (GARAMYCIN) 130 mg in dextrose 5 % 50 mL IVPB     130  mg 106.5 mL/hr over 30 Minutes Intravenous Every 48 hours 03/01/19 1339     03/03/19 1800  ceFAZolin (ANCEF) IVPB 2g/100 mL premix     2 g 200 mL/hr over 30 Minutes Intravenous Every Thu (1800) 03/01/19 1321 03/10/19 1759   03/02/19 1800   ceFAZolin (ANCEF) IVPB 2g/100 mL premix  Status:  Discontinued     2 g 200 mL/hr over 30 Minutes Intravenous Every M-W-F-Sa (1800) 02/28/19 1036 03/01/19 1321   03/01/19 2000  gentamicin (GARAMYCIN) 190 mg in dextrose 5 % 50 mL IVPB     190 mg 109.5 mL/hr over 30 Minutes Intravenous  Once 03/01/19 1340     03/01/19 1800  ceFAZolin (ANCEF) IVPB 2g/100 mL premix     2 g 200 mL/hr over 30 Minutes Intravenous  Once 03/01/19 1054     03/01/19 1400  rifampin (RIFADIN) capsule 300 mg     300 mg Oral Every 8 hours 03/01/19 1308     03/01/19 1400  gentamicin (GARAMYCIN) 190 mg in dextrose 5 % 50 mL IVPB  Status:  Discontinued     190 mg 109.5 mL/hr over 30 Minutes Intravenous  Once 03/01/19 1329 03/01/19 1340   02/28/19 1400  ceFAZolin (ANCEF) IVPB 2g/100 mL premix     2 g 200 mL/hr over 30 Minutes Intravenous  Once 02/28/19 1033 02/28/19 1334   02/28/19 1200  vancomycin (VANCOCIN) IVPB 750 mg/150 ml premix  Status:  Discontinued     750 mg 150 mL/hr over 60 Minutes Intravenous Every M-W-F (Hemodialysis) 02/27/19 1223 02/27/19 1258   02/27/19 1315  vancomycin (VANCOCIN) 1,250 mg in sodium chloride 0.9 % 250 mL IVPB  Status:  Discontinued     1,250 mg 166.7 mL/hr over 90 Minutes Intravenous  Once 02/27/19 1222 02/27/19 1258   02/27/19 1200  cefTRIAXone (ROCEPHIN) 2 g in sodium chloride 0.9 % 100 mL IVPB  Status:  Discontinued     2 g 200 mL/hr over 30 Minutes Intravenous Every 24 hours 02/27/19 1108 02/28/19 1033      Subjective: Seen on HD today. More alert and awake. States feeling thirsty  Objective: Vitals:   03/01/19 1200 03/01/19 1210 03/01/19 1220 03/01/19 1229  BP: (!) 79/58 (!) 76/53 (!) 65/54 93/64  Pulse:  100 (!) 111 (!) 106  Resp:  15 (!) 23 13  Temp:      TempSrc:      SpO2:  95% 93% 95%  Weight:        Intake/Output Summary (Last 24 hours) at 03/01/2019 1340 Last data filed at 03/01/2019 1104 Gross per 24 hour  Intake 300 ml  Output 1 ml  Net 299 ml   Filed  Weights   02/28/19 1135 03/01/19 0720 03/01/19 1104  Weight: 64.1 kg 64.2 kg 64.2 kg    Examination: General exam: Conversant, in no acute distress on HD Respiratory system: normal chest rise, clear, no audible wheezing Cardiovascular system: regular rhythm, s1-s2 Gastrointestinal system: Nondistended, nontender, pos BS Central nervous system: No seizures, no tremors Extremities: No cyanosis, no joint deformities Skin: No rashes, no pallor Psychiatry: Affect normal // no auditory hallucinations   Data Reviewed: I have personally reviewed following labs and imaging studies  CBC: Recent Labs  Lab 02/26/19 1253 02/27/19 1119 02/27/19 2035 02/28/19 0027 03/01/19 0413  WBC 13.7* 15.3*  --  15.7* 12.9*  NEUTROABS 12.1*  --   --   --   --   HGB 9.7* 7.5* 8.5* 8.5* 8.5*  HCT 28.5*  21.5* 25.6* 24.7* 24.4*  MCV 80.3 79.0*  --  81.0 78.7*  PLT 213 188  --  169 952   Basic Metabolic Panel: Recent Labs  Lab 02/26/19 1253 02/26/19 2116 02/27/19 1119 02/28/19 0027 03/01/19 0413  NA 131* 131* 134* 136 135  K 4.5 4.8 3.5 3.8 3.6  CL 89* 92* 96* 95* 96*  CO2 19* 20* 24 24 25   GLUCOSE 67* 70 103* 84 113*  BUN 71* 77* 21* 29* 43*  CREATININE 12.17* 12.06* 5.58* 6.47* 8.02*  CALCIUM 9.5 9.1 7.9* 8.5* 8.7*  MG  --  2.2  --   --   --    GFR: Estimated Creatinine Clearance: 7.7 mL/min (A) (by C-G formula based on SCr of 8.02 mg/dL (H)). Liver Function Tests: Recent Labs  Lab 02/26/19 1253 02/27/19 1119  AST 46* 59*  ALT 20 21  ALKPHOS 146* 106  BILITOT 1.5* 1.5*  PROT 8.0 6.7  ALBUMIN 3.3* 2.7*   No results for input(s): LIPASE, AMYLASE in the last 168 hours. No results for input(s): AMMONIA in the last 168 hours. Coagulation Profile: Recent Labs  Lab 02/26/19 1253 02/27/19 1119 02/28/19 0924 03/01/19 0413  INR 4.1* 3.3* 2.1* 2.1*   Cardiac Enzymes: Recent Labs  Lab 02/26/19 2116 02/27/19 1119  TROPONINI 0.04* 0.11*   BNP (last 3 results) No results for  input(s): PROBNP in the last 8760 hours. HbA1C: No results for input(s): HGBA1C in the last 72 hours. CBG: Recent Labs  Lab 02/27/19 0030 02/27/19 0223 02/27/19 0555 02/27/19 0621 02/27/19 0705  GLUCAP 65* 60* 55* 71 70   Lipid Profile: No results for input(s): CHOL, HDL, LDLCALC, TRIG, CHOLHDL, LDLDIRECT in the last 72 hours. Thyroid Function Tests: No results for input(s): TSH, T4TOTAL, FREET4, T3FREE, THYROIDAB in the last 72 hours. Anemia Panel: No results for input(s): VITAMINB12, FOLATE, FERRITIN, TIBC, IRON, RETICCTPCT in the last 72 hours. Sepsis Labs: Recent Labs  Lab 02/27/19 2040 02/28/19 0027  LATICACIDVEN 3.0* 2.7*    Recent Results (from the past 240 hour(s))  Culture, blood (Routine X 2) w Reflex to ID Panel     Status: Abnormal   Collection Time: 02/26/19  9:00 PM  Result Value Ref Range Status   Specimen Description BLOOD RIGHT HAND  Final   Special Requests   Final    BOTTLES DRAWN AEROBIC ONLY Blood Culture results may not be optimal due to an inadequate volume of blood received in culture bottles   Culture  Setup Time   Final    GRAM POSITIVE COCCI AEROBIC BOTTLE ONLY CRITICAL VALUE NOTED.  VALUE IS CONSISTENT WITH PREVIOUSLY REPORTED AND CALLED VALUE.    Culture (A)  Final    STAPHYLOCOCCUS AUREUS SUSCEPTIBILITIES PERFORMED ON PREVIOUS CULTURE WITHIN THE LAST 5 DAYS. Performed at [facility], [place] [address] [name], [place] [address]    Report Status 03/01/2019 FINAL  Final  Culture, blood (Routine X 2) w Reflex to ID Panel     Status: Abnormal   Collection Time: 02/26/19  9:05 PM  Result Value Ref Range Status   Specimen Description BLOOD RIGHT FOREARM  Final   Special Requests   Final    BOTTLES DRAWN AEROBIC ONLY Blood Culture adequate volume   Culture  Setup Time   Final    GRAM POSITIVE COCCI AEROBIC BOTTLE ONLY CRITICAL RESULT CALLED TO, READ BACK BY AND VERIFIED WITH: [place] [id number] VO [id number] AM BY CM Performed at [facility], [address] [place], [place] [address]    Culture STAPHYLOCOCCUS AUREUS (A)  Final   Report Status 03/01/2019 FINAL  Final   Organism ID, Bacteria STAPHYLOCOCCUS AUREUS  Final      Susceptibility   Staphylococcus aureus - MIC*    CIPROFLOXACIN <=0.5 SENSITIVE Sensitive     ERYTHROMYCIN <=0.25 SENSITIVE Sensitive     GENTAMICIN <=0.5 SENSITIVE Sensitive     OXACILLIN 0.5 SENSITIVE Sensitive     TETRACYCLINE <=1 SENSITIVE Sensitive     VANCOMYCIN 1 SENSITIVE Sensitive     TRIMETH/SULFA <=10 SENSITIVE Sensitive     CLINDAMYCIN <=0.25 SENSITIVE Sensitive     RIFAMPIN <=0.5 SENSITIVE Sensitive     Inducible Clindamycin NEGATIVE Sensitive     * STAPHYLOCOCCUS AUREUS  Blood Culture ID Panel (Reflexed)     Status: Abnormal   Collection Time: 02/26/19  9:05 PM  Result Value Ref Range Status   Enterococcus species NOT DETECTED NOT DETECTED Final   Listeria monocytogenes NOT DETECTED NOT DETECTED Final   Staphylococcus species DETECTED (A) NOT DETECTED Final    Comment: CRITICAL RESULT CALLED TO, READ BACK BY AND VERIFIED WITH: PHARMD M MACCIA [id number] AT 1056 AM BY CM    Staphylococcus aureus (BCID) DETECTED (A) NOT DETECTED Final    Comment: Methicillin (oxacillin) susceptible Staphylococcus aureus (MSSA). Preferred therapy is anti staphylococcal beta lactam antibiotic (Cefazolin or Nafcillin), unless clinically contraindicated. CRITICAL RESULT CALLED TO, READ BACK BY AND VERIFIED WITH: PHARMD M [place] [id number] AT 48 AM BY CM    Methicillin resistance NOT DETECTED NOT DETECTED Final   Streptococcus species DETECTED (A) NOT DETECTED Final    Comment: Not Enterococcus species, Streptococcus agalactiae, Streptococcus pyogenes, or Streptococcus pneumoniae. CRITICAL RESULT CALLED TO, READ BACK BY AND VERIFIED WITH: PHARMD M [place] [id number] AT 65 AM BY CM    Streptococcus agalactiae NOT DETECTED NOT DETECTED Final   Streptococcus pneumoniae NOT DETECTED NOT DETECTED Final    Streptococcus pyogenes NOT DETECTED NOT DETECTED Final   Acinetobacter baumannii NOT DETECTED NOT DETECTED Final   Enterobacteriaceae species NOT DETECTED NOT DETECTED Final   Enterobacter cloacae complex NOT DETECTED NOT DETECTED Final   Escherichia coli NOT DETECTED NOT DETECTED Final   Klebsiella oxytoca NOT DETECTED NOT DETECTED Final   Klebsiella pneumoniae NOT DETECTED NOT DETECTED Final   Proteus species NOT DETECTED NOT DETECTED Final   Serratia marcescens NOT DETECTED NOT DETECTED Final   Haemophilus influenzae NOT DETECTED NOT DETECTED Final   Neisseria meningitidis NOT DETECTED NOT DETECTED Final   Pseudomonas aeruginosa NOT DETECTED NOT DETECTED Final   Candida albicans NOT DETECTED NOT DETECTED Final   Candida glabrata NOT DETECTED NOT DETECTED Final   Candida krusei NOT DETECTED NOT DETECTED Final   Candida parapsilosis NOT DETECTED NOT DETECTED Final   Candida tropicalis NOT DETECTED NOT DETECTED Final    Comment: Performed at [facility], [address], [place], [place] [address]  Culture, blood (Routine X 2) w Reflex to ID Panel     Status: None (Preliminary result)   Collection Time: 02/28/19  8:50 AM  Result Value Ref Range Status   Specimen Description BLOOD RIGHT HAND  Final   Special Requests   Final    BOTTLES DRAWN AEROBIC ONLY Blood Culture results may not be optimal due to an inadequate volume of blood received in culture bottles   Culture   Final    NO GROWTH 1 DAY Performed at [facility], [place]  [address] [place], [place] [address]    Report Status PENDING  Incomplete  Culture, blood (Routine X 2) w Reflex to ID Panel     Status: None (Preliminary result)   Collection Time: 02/28/19  8:55 AM  Result Value Ref Range Status   Specimen Description BLOOD RIGHT HAND  Final   Special Requests   Final    BOTTLES DRAWN AEROBIC ONLY Blood Culture results may not be optimal due to an inadequate volume of blood received in culture bottles   Culture    Final    NO GROWTH 1 DAY Performed at [facility], [place] [address] [place], [place] [address]    Report Status PENDING  Incomplete     Radiology Studies: No results found.  Scheduled Meds: . amiodarone  400 mg Oral BID  . calcium acetate  667 mg Oral TID WC  . Chlorhexidine Gluconate Cloth  6 each Topical Q0600  . doxercalciferol  1 mcg Intravenous Q M,W,F-HD  . fluticasone  2 spray Each Nare Daily  . folic acid  1 mg Oral Daily  . loratadine  10 mg Oral Daily  . midodrine  10 mg Oral TID WC  . rifampin  300 mg Oral Q8H  . sodium chloride flush  3 mL Intravenous Q12H  . warfarin  2.5 mg Oral ONCE-1800  . Warfarin - Pharmacist Dosing Inpatient   Does not apply q1800   Continuous Infusions: .  ceFAZolin (ANCEF) IV    . [START ON 03/03/2019]  ceFAZolin (ANCEF) IV    . [START ON 03/03/2019] gentamicin    . gentamicin       LOS: 3 days   Marylu Lund, MD [facility] Pager On Amion  If 7PM-7AM, please contact night-coverage 03/01/2019, 1:40 PM

## 2019-03-01 NOTE — Progress Notes (Signed)
[facility] Progress Note   Assessment/ Plan:     [facility]FS [place]  4h  3K/2.25 bath Hep none  63kg  Hep none  LUA AVF 300/800 - leaving below the past two Rx's   - hectorol 1  - mircera 30 q 2 weeks - last 4/6 - Recent labs: h hgb 11.1 4/1 INR 5.98 4/6 41% sat Ca 8.8 P 1.8 ipTH 175 alb 3.6   Assessment/Plan: 1. MSSA bacteremia - per primary / ID/ cardiology, IV abx  2. H/o prosthetic mitral valve 3. AMS - due to infection, improving 4. Volume - got IVF boluses yest, up 1kg today 5. Hypotension - acute on chronic, cont ^'d midodrine 10 tid for now. Stable.  6. ESRD on HD : HD today off schedule. Will hold HD Wed, do HD Thursday and Sat then resume MWFSat.  7. Oral bleeding: from recent tooth extraction/ Jeannie Done. Better.  8. Wide complex tachycardia/ hx atrial fib: s/p amiodarone bolus, now in NSR.  History of Afib as well 9.  Anemia ckd - hgb 8-9 range, last tsat ok - watch trend - not due for redose until 4/15 - if drops further resume ESA next week.  10.  Metabolic bone disease -  Resume hectorol Monday - last P low - no binders for now 11.  Nutrition - renal diet  12. DM - per primary 13. 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 Substance abuse - hx of meth use - drug screen pending 15. Left wrist fx - last month  16. Supratherapeutic INR - better, 4.1 >> 2.1 today 17. H/o Cirrhosis 18. EOL - per conversation w/ pt's daughter 4/13 patient recently told her he would want everything done in case of arrest but doesn't want prolonged life support if not doing well.   Subjective:    Looks a little better today, is on HD w/ BP's in 80's.  No new c/o. Resting.    Objective:   BP 93/64   Pulse (!) 106   Temp 98.3 F (36.8 C) (Oral)   Resp 13   Wt 64.2 kg   SpO2 95%   BMI 25.89 kg/m   Physical Exam: Gen: lying in bed, responsinve, hard to understand CVS: irregular Resp: clear anteriorly and lat Abd:  slightly distended Ext: no LE edema Skin: diffuse chronic  petechial rash LE's and torso LUA AVF+bruit  Labs: BMET Recent Labs  Lab 02/26/19 1253 02/26/19 2116 02/27/19 1119 02/28/19 0027 03/01/19 0413  NA 131* 131* 134* 136 135  K 4.5 4.8 3.5 3.8 3.6  CL 89* 92* 96* 95* 96*  CO2 19* 20* 24 24 25   GLUCOSE 67* 70 103* 84 113*  BUN 71* 77* 21* 29* 43*  CREATININE 12.17* 12.06* 5.58* 6.47* 8.02*  CALCIUM 9.5 9.1 7.9* 8.5* 8.7*   CBC Recent Labs  Lab 02/26/19 1253 02/27/19 1119 02/27/19 2035 02/28/19 0027 03/01/19 0413  WBC 13.7* 15.3*  --  15.7* 12.9*  NEUTROABS 12.1*  --   --   --   --   HGB 9.7* 7.5* 8.5* 8.5* 8.5*  HCT 28.5* 21.5* 25.6* 24.7* 24.4*  MCV 80.3 79.0*  --  81.0 78.7*  PLT 213 188  --  169 178    @IMGRELPRIORS @ Medications:    . amiodarone  400 mg Oral BID  . calcium acetate  667 mg Oral TID WC  . Chlorhexidine Gluconate Cloth  6 each Topical  [id number]  . doxercalciferol  1 mcg Intravenous Q M,W,F-HD  . fluticasone  2 spray Each Nare Daily  . folic acid  1 mg Oral Daily  . loratadine  10 mg Oral Daily  . midodrine  10 mg Oral TID WC  . sodium chloride flush  3 mL Intravenous Q12H  . warfarin  2.5 mg Oral ONCE-1800  . Warfarin - Pharmacist Dosing Inpatient   Does not apply [phone]

## 2019-03-01 NOTE — Progress Notes (Signed)
Subjective:  thirsty  Antibiotics:  Anti-infectives (From admission, onward)   Start     Dose/Rate Route Frequency Ordered Stop   03/02/19 1800  ceFAZolin (ANCEF) IVPB 2g/100 mL premix     2 g 200 mL/hr over 30 Minutes Intravenous Every M-W-F-Sa (1800) 02/28/19 1036     03/01/19 1800  ceFAZolin (ANCEF) IVPB 2g/100 mL premix     2 g 200 mL/hr over 30 Minutes Intravenous  Once 03/01/19 1054     02/28/19 1400  ceFAZolin (ANCEF) IVPB 2g/100 mL premix     2 g 200 mL/hr over 30 Minutes Intravenous  Once 02/28/19 1033 02/28/19 1334   02/28/19 1200  vancomycin (VANCOCIN) IVPB 750 mg/150 ml premix  Status:  Discontinued     750 mg 150 mL/hr over 60 Minutes Intravenous Every M-W-F (Hemodialysis) 02/27/19 1223 02/27/19 1258   02/27/19 1315  vancomycin (VANCOCIN) 1,250 mg in sodium chloride 0.9 % 250 mL IVPB  Status:  Discontinued     1,250 mg 166.7 mL/hr over 90 Minutes Intravenous  Once 02/27/19 1222 02/27/19 1258   02/27/19 1200  cefTRIAXone (ROCEPHIN) 2 g in sodium chloride 0.9 % 100 mL IVPB  Status:  Discontinued     2 g 200 mL/hr over 30 Minutes Intravenous Every 24 hours 02/27/19 1108 02/28/19 1033      Medications: Scheduled Meds: . amiodarone  400 mg Oral BID  . calcium acetate  667 mg Oral TID WC  . Chlorhexidine Gluconate Cloth  6 each Topical Q0600  . doxercalciferol  1 mcg Intravenous Q M,W,F-HD  . fluticasone  2 spray Each Nare Daily  . folic acid  1 mg Oral Daily  . loratadine  10 mg Oral Daily  . midodrine  10 mg Oral TID WC  . sodium chloride flush  3 mL Intravenous Q12H  . warfarin  2.5 mg Oral ONCE-1800  . Warfarin - Pharmacist Dosing Inpatient   Does not apply q1800   Continuous Infusions: . [START ON 03/02/2019]  ceFAZolin (ANCEF) IV    .  ceFAZolin (ANCEF) IV     PRN Meds:.acetaminophen, hydrALAZINE, lidocaine, midodrine, ondansetron **OR** ondansetron (ZOFRAN) IV, oxyCODONE, sodium chloride flush    Objective: Weight change:   Intake/Output  Summary (Last 24 hours) at 03/01/2019 1304 Last data filed at 03/01/2019 1104 Gross per 24 hour  Intake 300 ml  Output 1 ml  Net 299 ml   Blood pressure 93/64, pulse (!) 106, temperature 98.3 F (36.8 C), temperature source Oral, resp. rate 13, weight 64.2 kg, SpO2 95 %. Temp:  [97 F (36.1 C)-98.4 F (36.9 C)] 98.3 F (36.8 C) (04/14 1104) Pulse Rate:  [98-111] 106 (04/14 1229) Resp:  [11-23] 13 (04/14 1229) BP: (65-99)/(38-69) 93/64 (04/14 1229) SpO2:  [90 %-100 %] 95 % (04/14 1229) Weight:  [64.2 kg] 64.2 kg (04/14 1104)  Physical Exam: General: Fatigued appearing , alert receiving hemodialysis HEENT: anicteric sclera, EOMI CVS regular rate, normal , murmur at the left upper sternal border and PMI heard chest: , no wheezing, no respiratory distress, lungs are fairly clear Abdomen: soft non-distended,  Extremities: Left wrist with tenderness to palpation is wrapped  skin: Extensive rash as documented yesterday. Neuro: nonfocal  CBC:    BMET Recent Labs    02/28/19 0027 03/01/19 0413  NA 136 135  K 3.8 3.6  CL 95* 96*  CO2 24 25  GLUCOSE 84 113*  BUN 29* 43*  CREATININE 6.47* 8.02*  CALCIUM 8.5*  8.7*     Liver Panel  Recent Labs    02/27/19 1119  PROT 6.7  ALBUMIN 2.7*  AST 59*  ALT 21  ALKPHOS 106  BILITOT 1.5*       Sedimentation Rate No results for input(s): ESRSEDRATE in the last 72 hours. C-Reactive Protein No results for input(s): CRP in the last 72 hours.  Micro Results: Recent Results (from the past 720 hour(s))  Blood culture (routine x 2)     Status: None   Collection Time: 02/03/19  6:50 PM  Result Value Ref Range Status   Specimen Description BLOOD RIGHT HAND  Final   Special Requests   Final    BOTTLES DRAWN AEROBIC AND ANAEROBIC Blood Culture adequate volume   Culture   Final    NO GROWTH 5 DAYS Performed at [facility], [address] [place], [place] [address]    Report Status 02/08/2019 FINAL  Final  MRSA PCR  Screening     Status: None   Collection Time: 02/03/19  8:37 PM  Result Value Ref Range Status   MRSA by PCR NEGATIVE NEGATIVE Final    Comment:        The GeneXpert MRSA Assay (FDA approved for NASAL specimens only), is one component of a comprehensive MRSA colonization surveillance program. It is not intended to diagnose MRSA infection nor to guide or monitor treatment for MRSA infections. Performed at [facility], [place] [address] [place], [place] [address]   Culture, blood (Routine X 2) w Reflex to ID Panel     Status: Abnormal   Collection Time: 02/26/19  9:00 PM  Result Value Ref Range Status   Specimen Description BLOOD RIGHT HAND  Final   Special Requests   Final    BOTTLES DRAWN AEROBIC ONLY Blood Culture results may not be optimal due to an inadequate volume of blood received in culture bottles   Culture  Setup Time   Final    GRAM POSITIVE COCCI AEROBIC BOTTLE ONLY CRITICAL VALUE NOTED.  VALUE IS CONSISTENT WITH PREVIOUSLY REPORTED AND CALLED VALUE.    Culture (A)  Final    STAPHYLOCOCCUS AUREUS SUSCEPTIBILITIES PERFORMED ON PREVIOUS CULTURE WITHIN THE LAST 5 DAYS. Performed at [facility], [place] [address], [place], [place] [address]    Report Status 03/01/2019 FINAL  Final  Culture, blood (Routine X 2) w Reflex to ID Panel     Status: Abnormal   Collection Time: 02/26/19  9:05 PM  Result Value Ref Range Status   Specimen Description BLOOD RIGHT FOREARM  Final   Special Requests   Final    BOTTLES DRAWN AEROBIC ONLY Blood Culture adequate volume   Culture  Setup Time   Final    GRAM POSITIVE COCCI AEROBIC BOTTLE ONLY CRITICAL RESULT CALLED TO, READ BACK BY AND VERIFIED WITH: Glen Allen [phone] AT 1054 AM BY CM Performed at [facility], [place] [address], [place],  [address]    Culture STAPHYLOCOCCUS AUREUS (A)  Final   Report Status 03/01/2019 FINAL  Final   Organism ID, Bacteria STAPHYLOCOCCUS AUREUS  Final      Susceptibility    Staphylococcus aureus - MIC*    CIPROFLOXACIN <=0.5 SENSITIVE Sensitive     ERYTHROMYCIN <=0.25 SENSITIVE Sensitive     GENTAMICIN <=0.5 SENSITIVE Sensitive     OXACILLIN 0.5 SENSITIVE Sensitive     TETRACYCLINE <=1 SENSITIVE Sensitive     VANCOMYCIN 1 SENSITIVE Sensitive  TRIMETH/SULFA <=10 SENSITIVE Sensitive     CLINDAMYCIN <=0.25 SENSITIVE Sensitive     RIFAMPIN <=0.5 SENSITIVE Sensitive     Inducible Clindamycin NEGATIVE Sensitive     * STAPHYLOCOCCUS AUREUS  Blood Culture ID Panel (Reflexed)     Status: Abnormal   Collection Time: 02/26/19  9:05 PM  Result Value Ref Range Status   Enterococcus species NOT DETECTED NOT DETECTED Final   Listeria monocytogenes NOT DETECTED NOT DETECTED Final   Staphylococcus species DETECTED (A) NOT DETECTED Final    Comment: CRITICAL RESULT CALLED TO, READ BACK BY AND VERIFIED WITH: PHARMD M [place] [phone] AT 1056 AM BY CM    Staphylococcus aureus (BCID) DETECTED (A) NOT DETECTED Final    Comment: Methicillin (oxacillin) susceptible Staphylococcus aureus (MSSA). Preferred therapy is anti staphylococcal beta lactam antibiotic (Cefazolin or Nafcillin), unless clinically contraindicated. CRITICAL RESULT CALLED TO, READ BACK BY AND VERIFIED WITH: PHARMD M [place] [id number] AT 94 AM BY CM    Methicillin resistance NOT DETECTED NOT DETECTED Final   Streptococcus species DETECTED (A) NOT DETECTED Final    Comment: Not Enterococcus species, Streptococcus agalactiae, Streptococcus pyogenes, or Streptococcus pneumoniae. CRITICAL RESULT CALLED TO, READ BACK BY AND VERIFIED WITH: PHARMD M [place] [phone] AT 76 AM BY CM    Streptococcus agalactiae NOT DETECTED NOT DETECTED Final   Streptococcus pneumoniae NOT DETECTED NOT DETECTED Final   Streptococcus pyogenes NOT DETECTED NOT DETECTED Final   Acinetobacter baumannii NOT DETECTED NOT DETECTED Final   Enterobacteriaceae species NOT DETECTED NOT DETECTED Final   Enterobacter cloacae complex NOT DETECTED  NOT DETECTED Final   Escherichia coli NOT DETECTED NOT DETECTED Final   Klebsiella oxytoca NOT DETECTED NOT DETECTED Final   Klebsiella pneumoniae NOT DETECTED NOT DETECTED Final   Proteus species NOT DETECTED NOT DETECTED Final   Serratia marcescens NOT DETECTED NOT DETECTED Final   Haemophilus influenzae NOT DETECTED NOT DETECTED Final   Neisseria meningitidis NOT DETECTED NOT DETECTED Final   Pseudomonas aeruginosa NOT DETECTED NOT DETECTED Final   Candida albicans NOT DETECTED NOT DETECTED Final   Candida glabrata NOT DETECTED NOT DETECTED Final   Candida krusei NOT DETECTED NOT DETECTED Final   Candida parapsilosis NOT DETECTED NOT DETECTED Final   Candida tropicalis NOT DETECTED NOT DETECTED Final    Comment: Performed at [facility], [address] [place], [place] [address]  Culture, blood (Routine X 2) w Reflex to ID Panel     Status: None (Preliminary result)   Collection Time: 02/28/19  8:50 AM  Result Value Ref Range Status   Specimen Description BLOOD RIGHT HAND  Final   Special Requests   Final    BOTTLES DRAWN AEROBIC ONLY Blood Culture results may not be optimal due to an inadequate volume of blood received in culture bottles   Culture   Final    NO GROWTH 1 DAY Performed at [facility], [place] [address] [place], [place] [address]    Report Status PENDING  Incomplete  Culture, blood (Routine X 2) w Reflex to ID Panel     Status: None (Preliminary result)   Collection Time: 02/28/19  8:55 AM  Result Value Ref Range Status   Specimen Description BLOOD RIGHT HAND  Final   Special Requests   Final    BOTTLES DRAWN AEROBIC ONLY Blood Culture results may not be optimal due to an inadequate volume of blood received in culture bottles   Culture   Final    NO  GROWTH 1 DAY Performed at [facility], [place] [address] [place], [place] [address]    Report Status PENDING  Incomplete    Studies/Results: No results found.    Assessment/Plan:  INTERVAL  HISTORY: 2D echocardiogram did not show vegetation Active Problems:   Oral bleeding   Encephalopathy   Wrist fracture, closed, left, with routine healing, subsequent encounter   Metabolic encephalopathy   Bacteremia   Fracture   Hypoglycemia   MSSA bacteremia    Michael [name] is a 58 y.o. 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  Specific aspects of clinical management are critical to optimizing the outcome of patients with SAB.  Therefore, the [facility] Antimicrobial Management Team [facility]) has initiated an intervention aimed at improving the management of SAB at [facility].  To do so, Infectious Diseases physicians are providing an evidence-based consult for the management of all patients with SAB.     Yes No Comments  Perform follow-up blood cultures (even if the patient is afebrile) to ensure clearance of bacteremia [x]  []   no growth at 1 day  Remove vascular catheter and obtain follow-up blood cultures after the removal of the catheter []  []    Perform echocardiography to evaluate for endocarditis (transthoracic ECHO is 40-50% sensitive, TEE is > 90% sensitive) [x]  []   no vegetations by 2D echocardiogram  Consult electrophysiologist to evaluate implanted cardiac device (pacemaker, ICD) []  []   N/A  Ensure source control []  []  Have all abscesses been drained effectively? Have deep seeded infections (septic joints or osteomyelitis) had appropriate surgical debridement?  Have concerns that site of wrist fracture could be infected, will get MRI of wrist when able to do  so  Also monitor fistula  Investigate for "metastatic" sites of infection []  []  Does the patient have ANY symptom or physical exam finding that would suggest a deeper infection (back or neck pain that may be suggestive of vertebral osteomyelitis or epidural abscess, muscle pain that could be a symptom of pyomyositis)?  Keep in mind that for deep seeded infections MRI imaging with contrast is preferred rather than other often insensitive tests such as plain x-rays, especially early in a patient's presentation.    See above regarding concerns of wrist being infected  Also consider diagnostic thoracocentesis  Change antibiotic therapy to cefazolin but will add gentamicin and rifampin tomorrow if we have had no growth of any organisms on cultures at 24 hours []  []  Beta-lactam antibiotics are preferred for MSSA due to higher cure rates.   If on Vancomycin, goal trough should be 15 - 20 mcg/mL  Estimated duration of IV antibiotic therapy: 6 weeks of vancomycin and rifampin with 2 weeks of gentamicin []  []  Consult case management for probably prolonged outpatient IV antibiotic therapy   #2 Wrist fracture: Will get an MRI when he is capable of having 1 done.  #3 Rash: Seems fairly stable not sure if this is related to his bacteremia or is a chronic problem   LOS: 3 days   Alcide Evener 03/01/2019, 1:04 PM

## 2019-03-02 ENCOUNTER — Inpatient Hospital Stay (HOSPITAL_COMMUNITY): Payer: Medicare Other

## 2019-03-02 DIAGNOSIS — F1590 Other stimulant use, unspecified, uncomplicated: Secondary | ICD-10-CM

## 2019-03-02 DIAGNOSIS — I4819 Other persistent atrial fibrillation: Secondary | ICD-10-CM

## 2019-03-02 LAB — PROTIME-INR
INR: 1.9 — ABNORMAL HIGH (ref 0.8–1.2)
Prothrombin Time: 21.3 seconds — ABNORMAL HIGH (ref 11.4–15.2)

## 2019-03-02 MED ORDER — CEFAZOLIN SODIUM-DEXTROSE 2-4 GM/100ML-% IV SOLN: 2.0000 g | INTRAVENOUS | Status: DC

## 2019-03-02 MED ORDER — WARFARIN SODIUM 4 MG PO TABS
4.0000 mg | ORAL_TABLET | Freq: Once | ORAL | Status: AC
  Administered 2019-03-02: 4 mg via ORAL
  Filled 2019-03-02: qty 1

## 2019-03-02 MED ORDER — CHLORHEXIDINE GLUCONATE CLOTH 2 % EX PADS: 6.0000 | MEDICATED_PAD | Freq: Every day | CUTANEOUS | Status: DC

## 2019-03-02 MED ORDER — WHITE PETROLATUM EX OINT
TOPICAL_OINTMENT | CUTANEOUS | Status: DC | PRN
  Filled 2019-03-02: qty 28.35

## 2019-03-02 MED ORDER — CEFAZOLIN SODIUM-DEXTROSE 2-4 GM/100ML-% IV SOLN
2.0000 g | INTRAVENOUS | Status: DC
  Administered 2019-03-03: 14:00:00 2 g via INTRAVENOUS
  Filled 2019-03-02 (×2): qty 100

## 2019-03-02 NOTE — Progress Notes (Signed)
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 I was unable to obtain history from patient, attempted to call daughter x2 with no answers. History per chart review. Apparently patient missed dialysis multiple times this week he has had slow, bleeding for about a week and INR was checked and found to be elevated. Apparently patient was yelling at his house and was found very confused and not making sense. Level 5 caveat.  In ED, confused and agitated. Cr 12.17, BUN 71, glucose 67, ALP 146, WBC 13.7, hemoglobin 9.7, INR 6.0, PT 38.9. CT of the head with no acute findings. Rectal temperature 100.2.  Subjective: No major events overnight of this morning.  Main complaint is about his left wrist pain.  Denies chest pain, dyspnea, abdominal pain, nausea or vomiting.  Asking for assistance for his breakfast.  Oriented x4- the president.  Assessment & Plan: Active Problems:   Oral bleeding   Encephalopathy   Wrist fracture, closed, left, with routine healing, subsequent encounter   Metabolic encephalopathy   Bacteremia   Fracture   Hypoglycemia   MSSA bacteremia   Prosthetic valve endocarditis ([place])  Acute metabolic encephalopathy: Resolved.  Oriented x4- President of the [place] but slow.  No focal neuro deficit.  Suspect encephalopathy to be due to MSSA bacteremia and methamphetamine use.  There could be some underlying cognitive impairment as well. -Treated treatable causes -Delirium precautions  MSSA and possible streptococcal bacteremia: Unclear source of  infection at this point but suspect left wrist infection.  -2D echo without vegetation.  Not a candidate for TEE per cardiology due to hemodynamics -ID treating for presumed endocarditis with Ancef and rifampin given prosthetic mitral valve -MRI of left wrist with known subacute fractures of the distal radius and ulna, TFCC tear but no evidence of infection or carpal bone fractures. -Follow CT chest-ordered by ID. -Repeat blood cultures from 02/28/2019- so far -Vancomycin and ceftriaxone 4/12 -Ancef 4/13-- -rifampin 4/14-- -gentamicin/14--  Stable left distal radius and ulna fractures/TFC tear: Subacute.  This was noted on x-ray on 02/03/2019 -Supportive care. -Pain management  ESRD: On HD MWF at [facility] -Nephrology following  Abdominal pain: Seems to have resolved. 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  Supratherapeutic INR: Elevated to 6.0 on admission.  Coumadin held.  Now subtherapeutic at 1.9 -Pharmacy managing warfarin -Daily PT/INR  Chronic atrial fibrillation WCT: Not in RVR.  Heart rate in low 100s. -Cardiology and EP involved -Continue amiodarone and warfarin -Metoprolol on hold due to hypotension  Hypertension: Continues to have soft blood pressures -Continue holding metoprolol -Continue home Midodrin  DM-2 with renal complication: CBG fairly controlled.  Does not seem to be medication. -Check A1c -CBG monitoring and sliding scale insulin -Continue home statin  History of leukocytoclastic vasculitis/chronic macular rash: areas of hyperpigmented and erythematous macules on lower extremities bilaterally.  Stable. -Outpatient follow-up  Scheduled Meds:  amiodarone  400 mg Oral BID   calcium acetate  667 mg Oral TID WC   Chlorhexidine Gluconate Cloth  6 each Topical Q0600   doxercalciferol  1 mcg Intravenous Q  M,W,F-HD   fluticasone  2 spray Each Nare Daily   folic acid  1  mg Oral Daily   loratadine  10 mg Oral Daily   midodrine  10 mg Oral TID WC   rifampin  300 mg Oral Q8H   sodium chloride flush  3 mL Intravenous Q12H   warfarin  4 mg Oral ONCE-1800   Warfarin - Pharmacist Dosing Inpatient   Does not apply q1800   Continuous Infusions:  [START ON 03/03/2019]  ceFAZolin (ANCEF) IV     [START ON 03/03/2019]  ceFAZolin (ANCEF) IV     [START ON 03/07/2019]  ceFAZolin (ANCEF) IV     [START ON 03/03/2019] gentamicin     PRN Meds:.acetaminophen, hydrALAZINE, lidocaine, midodrine, ondansetron **OR** ondansetron (ZOFRAN) IV, oxyCODONE, sodium chloride flush  DVT prophylaxis: On warfarin for atrial fibrillation Code Status: Full code Family Communication: None at bedside.  Called and updated daughter over the phone. Disposition Plan: Remains inpatient for treatment of bacteremia and endocarditis with IV antibiotics. Final disposition will be determined after PT/OT eval. Daughter concerned about living place and asks for placement  Consultants:   Infectious disease  Nephrology  Cardiology  Procedures:   None  Antimicrobials:  As above  Objective: Vitals:   03/02/19 0411 03/02/19 0454 03/02/19 0855 03/02/19 1143  BP: 90/61  (!) 84/65 92/64  Pulse: (!) 109  (!) 108 (!) 106  Resp: 16  15 11   Temp: (!) 97.5 F (36.4 C)     TempSrc: Oral     SpO2: 99%  100% 99%  Weight:  65.6 kg    Height:        Intake/Output Summary (Last 24 hours) at 03/02/2019 1310 Last data filed at 03/02/2019 0912 Gross per 24 hour  Intake 988.8 ml  Output --  Net 988.8 ml   Filed Weights   03/01/19 0720 03/01/19 1104 03/02/19 0454  Weight: 64.2 kg 64.2 kg 65.6 kg    Examination:  GENERAL: Appears well. No acute distress.  EYES - vision grossly intact. Sclera anicteric.  NOSE- no gross deformity or drainage MOUTH -chapped lips THROAT- no swelling or erythema LUNGS:  No IWOB. Good air movement. CTAB.  HEART: RRR. Heart sounds normal.  2/6 SEM over  RUSB ABD: Bowel sounds present. Soft. Non tender.  MSK/EXT: Moves extremities. No obvious deformity.  Left arm wrapped in Ace wrap.  SKIN: Scattered macular and erythematous lesions over lower extremities NEURO: Awake, alert and oriented oriented x4-president of [place]. No gross deficit.  PSYCH: Calm. Normal affect.    Data Reviewed: I have independently reviewed following labs and imaging studies  CBC: Recent Labs  Lab 02/26/19 1253 02/27/19 1119 02/27/19 2035 02/28/19 0027 03/01/19 0413  WBC 13.7* 15.3*  --  15.7* 12.9*  NEUTROABS 12.1*  --   --   --   --   HGB 9.7* 7.5* 8.5* 8.5* 8.5*  HCT 28.5* 21.5* 25.6* 24.7* 24.4*  MCV 80.3 79.0*  --  81.0 78.7*  PLT 213 188  --  169 268   Basic Metabolic Panel: Recent Labs  Lab 02/26/19 1253 02/26/19 2116 02/27/19 1119 02/28/19 0027 03/01/19 0413  NA 131* 131* 134* 136 135  K 4.5 4.8 3.5 3.8 3.6  CL 89* 92* 96* 95* 96*  CO2 19* 20* 24 24 25   GLUCOSE 67* 70 103* 84 113*  BUN 71* 77* 21* 29* 43*  CREATININE 12.17* 12.06* 5.58* 6.47* 8.02*  CALCIUM 9.5 9.1 7.9* 8.5* 8.7*  MG  --  2.2  --   --   --    GFR: Estimated Creatinine Clearance: 8.3 mL/min (A) (by C-G formula based on SCr of 8.02 mg/dL (H)). Liver Function Tests: Recent Labs  Lab 02/26/19 1253 02/27/19 1119  AST 46* 59*  ALT 20 21  ALKPHOS 146* 106  BILITOT 1.5* 1.5*  PROT 8.0 6.7  ALBUMIN 3.3* 2.7*   No results for input(s): LIPASE, AMYLASE in the last 168 hours. No results for input(s): AMMONIA in the last 168 hours. Coagulation Profile: Recent Labs  Lab 02/26/19 1253 02/27/19 1119 02/28/19 0924 03/01/19 0413 03/02/19 0442  INR 4.1* 3.3* 2.1* 2.1* 1.9*   Cardiac Enzymes: Recent Labs  Lab 02/26/19 2116 02/27/19 1119  TROPONINI 0.04* 0.11*   BNP (last 3 results) No results for input(s): PROBNP in the last 8760 hours. HbA1C: No results for input(s): HGBA1C in the last 72 hours. CBG: Recent Labs  Lab 02/27/19 0030 02/27/19 0223  02/27/19 0555 02/27/19 0621 02/27/19 0705  GLUCAP 65* 60* 55* 71 70   Lipid Profile: No results for input(s): CHOL, HDL, LDLCALC, TRIG, CHOLHDL, LDLDIRECT in the last 72 hours. Thyroid Function Tests: No results for input(s): TSH, T4TOTAL, FREET4, T3FREE, THYROIDAB in the last 72 hours. Anemia Panel: No results for input(s): VITAMINB12, FOLATE, FERRITIN, TIBC, IRON, RETICCTPCT in the last 72 hours. Urine analysis:    Component Value Date/Time   COLORURINE AMBER (A) 08/11/2018 1943   APPEARANCEUR CLOUDY (A) 08/11/2018 1943   LABSPEC 1.021 08/11/2018 1943   PHURINE 5.0 08/11/2018 1943   GLUCOSEU NEGATIVE 08/11/2018 1943   HGBUR LARGE (A) 08/11/2018 [place] NEGATIVE 08/11/2018 1943   KETONESUR 5 (A) 08/11/2018 1943   PROTEINUR 100 (A) 08/11/2018 1943   NITRITE NEGATIVE 08/11/2018 1943   LEUKOCYTESUR MODERATE (A) 08/11/2018 1943   Sepsis Labs: Invalid input(s): PROCALCITONIN, LACTICIDVEN  Recent Results (from the past 240 hour(s))  Culture, blood (Routine X 2) w Reflex to ID Panel     Status: Abnormal   Collection Time: 02/26/19  9:00 PM  Result Value Ref Range Status   Specimen Description BLOOD RIGHT HAND  Final   Special Requests   Final    BOTTLES DRAWN AEROBIC ONLY Blood Culture results may not be optimal due to an inadequate volume of blood received in culture bottles   Culture  Setup Time   Final    GRAM POSITIVE COCCI AEROBIC BOTTLE ONLY CRITICAL VALUE NOTED.  VALUE IS CONSISTENT WITH PREVIOUSLY REPORTED AND CALLED VALUE.    Culture (A)  Final    STAPHYLOCOCCUS AUREUS SUSCEPTIBILITIES PERFORMED ON PREVIOUS CULTURE WITHIN THE LAST 5 DAYS. Performed at [facility], [place] [address], [place], [place] [address]    Report Status 03/01/2019 FINAL  Final  Culture, blood (Routine X 2) w Reflex to ID Panel     Status: Abnormal   Collection Time: 02/26/19  9:05 PM  Result Value Ref Range Status   Specimen Description BLOOD RIGHT FOREARM  Final   Special  Requests   Final    BOTTLES DRAWN AEROBIC ONLY Blood Culture adequate volume   Culture  Setup Time   Final    GRAM POSITIVE COCCI AEROBIC BOTTLE ONLY CRITICAL RESULT CALLED TO, READ BACK BY AND VERIFIED WITH: [place] [id number] AT 1054 AM BY CM Performed at [facility], [place] [address] [place], [place] [address]    Culture STAPHYLOCOCCUS AUREUS (A)  Final   Report Status 03/01/2019 FINAL  Final  Organism ID, Bacteria STAPHYLOCOCCUS AUREUS  Final      Susceptibility   Staphylococcus aureus - MIC*    CIPROFLOXACIN <=0.5 SENSITIVE Sensitive     ERYTHROMYCIN <=0.25 SENSITIVE Sensitive     GENTAMICIN <=0.5 SENSITIVE Sensitive     OXACILLIN 0.5 SENSITIVE Sensitive     TETRACYCLINE <=1 SENSITIVE Sensitive     VANCOMYCIN 1 SENSITIVE Sensitive     TRIMETH/SULFA <=10 SENSITIVE Sensitive     CLINDAMYCIN <=0.25 SENSITIVE Sensitive     RIFAMPIN <=0.5 SENSITIVE Sensitive     Inducible Clindamycin NEGATIVE Sensitive     * STAPHYLOCOCCUS AUREUS  Blood Culture ID Panel (Reflexed)     Status: Abnormal   Collection Time: 02/26/19  9:05 PM  Result Value Ref Range Status   Enterococcus species NOT DETECTED NOT DETECTED Final   Listeria monocytogenes NOT DETECTED NOT DETECTED Final   Staphylococcus species DETECTED (A) NOT DETECTED Final    Comment: CRITICAL RESULT CALLED TO, READ BACK BY AND VERIFIED WITH: PHARMD M [place] [id number] AT 1056 AM BY CM    Staphylococcus aureus (BCID) DETECTED (A) NOT DETECTED Final    Comment: Methicillin (oxacillin) susceptible Staphylococcus aureus (MSSA). Preferred therapy is anti staphylococcal beta lactam antibiotic (Cefazolin or Nafcillin), unless clinically contraindicated. CRITICAL RESULT CALLED TO, READ BACK BY AND VERIFIED WITH: PHARMD M [place] [id number] AT 44 AM BY CM    Methicillin resistance NOT DETECTED NOT DETECTED Final   Streptococcus species DETECTED (A) NOT DETECTED Final    Comment: Not Enterococcus species, Streptococcus agalactiae,  Streptococcus pyogenes, or Streptococcus pneumoniae. CRITICAL RESULT CALLED TO, READ BACK BY AND VERIFIED WITH: PHARMD M [place] [id number] AT 26 AM BY CM    Streptococcus agalactiae NOT DETECTED NOT DETECTED Final   Streptococcus pneumoniae NOT DETECTED NOT DETECTED Final   Streptococcus pyogenes NOT DETECTED NOT DETECTED Final   Acinetobacter baumannii NOT DETECTED NOT DETECTED Final   Enterobacteriaceae species NOT DETECTED NOT DETECTED Final   Enterobacter cloacae complex NOT DETECTED NOT DETECTED Final   Escherichia coli NOT DETECTED NOT DETECTED Final   Klebsiella oxytoca NOT DETECTED NOT DETECTED Final   Klebsiella pneumoniae NOT DETECTED NOT DETECTED Final   Proteus species NOT DETECTED NOT DETECTED Final   Serratia marcescens NOT DETECTED NOT DETECTED Final   Haemophilus influenzae NOT DETECTED NOT DETECTED Final   Neisseria meningitidis NOT DETECTED NOT DETECTED Final   Pseudomonas aeruginosa NOT DETECTED NOT DETECTED Final   Candida albicans NOT DETECTED NOT DETECTED Final   Candida glabrata NOT DETECTED NOT DETECTED Final   Candida krusei NOT DETECTED NOT DETECTED Final   Candida parapsilosis NOT DETECTED NOT DETECTED Final   Candida tropicalis NOT DETECTED NOT DETECTED Final    Comment: Performed at [facility], [address] [place], [place] [address]  Culture, blood (Routine X 2) w Reflex to ID Panel     Status: None (Preliminary result)   Collection Time: 02/28/19  8:50 AM  Result Value Ref Range Status   Specimen Description BLOOD RIGHT HAND  Final   Special Requests   Final    BOTTLES DRAWN AEROBIC ONLY Blood Culture results may not be optimal due to an inadequate volume of blood received in culture bottles   Culture   Final    NO GROWTH 2 DAYS Performed at [facility], [place] [address] [place], [place] [address]    Report Status PENDING  Incomplete  Culture, blood (Routine X 2) w Reflex to ID Panel  Status: None (Preliminary result)   Collection  Time: 02/28/19  8:55 AM  Result Value Ref Range Status   Specimen Description BLOOD RIGHT HAND  Final   Special Requests   Final    BOTTLES DRAWN AEROBIC ONLY Blood Culture results may not be optimal due to an inadequate volume of blood received in culture bottles   Culture   Final    NO GROWTH 2 DAYS Performed at [facility], [place] [address] [place], [place] [address]    Report Status PENDING  Incomplete      Radiology Studies: Mr Wrist Left Wo Contrast  Result Date: 03/02/2019 CLINICAL DATA:  Wrist fracture. Bacteremia. Multiple underlying medical problems including diabetes, vasculitis, cirrhosis and end-stage renal disease. EXAM: MR OF THE LEFT WRIST WITHOUT CONTRAST TECHNIQUE: Multiplanar, multisequence MR imaging of the left wrist was performed. No intravenous contrast was administered. COMPARISON:  Radiographs 02/26/2019 and 02/03/2019. FINDINGS: Despite efforts by the technologist and patient, severe motion artifact is present on today's exam and could not be eliminated. This reduces exam sensitivity and specificity. The study is nearly nondiagnostic. Ligaments: Suboptimally visualized due to motion. Triangular fibrocartilage: There is a full-thickness tear of the triangular fibrocartilage near the radial attachment, best seen images 9 and 10 of series 11. Tendons: The wrist tendons appear intact without significant tenosynovitis. Carpal tunnel/median nerve: Grossly unremarkable. Guyon's canal: Grossly unremarkable. Joint/cartilage: There is a small to moderate amount of fluid within all of the wrist joint compartments. No gross chondral defect or subchondral signal abnormality. Bones/carpal alignment: Comminuted and impacted fractures of the distal radius and ulna are grossly stable from the prior radiographs. No carpal bone fracture, dislocation or bone destruction identified. Other: There is soft tissue swelling and fluid surrounding the distal forearm fractures, especially volar to  the distal radius. IMPRESSION: 1. Significantly limited study due to motion. Detail is significantly limited. 2. No unexpected findings are seen in this patient with known subacute fractures of the distal radius and ulna. No evidence of carpal bone fracture or bone destruction. 3. TFCC tear. Electronically Signed   By: Richardean Sale M.D.   On: 03/02/2019 08:15    [name] Pager [phone]  If 7PM-7AM, please contact night-coverage [email or web address] Password [email or web address] 03/02/2019, 1:10 PM

## 2019-03-02 NOTE — Progress Notes (Signed)
Progress Note  Patient Name: Michael [name] Date of Encounter: 03/02/2019  Primary Cardiologist: [name], MD   Subjective   Blood pressures much improved throughout the evening into this morning.  The patient complained of pain in his left wrist this morning.  Denied chest pain or shortness of breath.  Inpatient Medications    Scheduled Meds: . amiodarone  400 mg Oral BID  . calcium acetate  667 mg Oral TID WC  . Chlorhexidine Gluconate Cloth  6 each Topical Q0600  . doxercalciferol  1 mcg Intravenous Q M,W,F-HD  . fluticasone  2 spray Each Nare Daily  . folic acid  1 mg Oral Daily  . loratadine  10 mg Oral Daily  . midodrine  10 mg Oral TID WC  . rifampin  300 mg Oral Q8H  . sodium chloride flush  3 mL Intravenous Q12H  . Warfarin - Pharmacist Dosing Inpatient   Does not apply q1800   Continuous Infusions: . [START ON 03/03/2019]  ceFAZolin (ANCEF) IV    . [START ON 03/03/2019] gentamicin     PRN Meds: acetaminophen, hydrALAZINE, lidocaine, midodrine, ondansetron **OR** ondansetron (ZOFRAN) IV, oxyCODONE, sodium chloride flush   Vital Signs    Vitals:   03/01/19 2351 03/02/19 0030 03/02/19 0411 03/02/19 0454  BP:  90/65 90/61   Pulse:  (!) 112 (!) 109   Resp:  15 16   Temp: 98.5 F (36.9 C)  (!) 97.5 F (36.4 C)   TempSrc: Oral  Oral   SpO2:  96% 99%   Weight:    65.6 kg  Height:        Intake/Output Summary (Last 24 hours) at 03/02/2019 0848 Last data filed at 03/02/2019 0411 Gross per 24 hour  Intake 634.8 ml  Output 0 ml  Net 634.8 ml   Last 3 Weights 03/02/2019 03/01/2019 03/01/2019  Weight (lbs) 144 lb 10 oz 141 lb 8.6 oz 141 lb 8.6 oz  Weight (kg) 65.6 kg 64.2 kg 64.2 kg      Telemetry    Atrial fibrillation, heart rate approximately 100 bpm - Personally Reviewed   Physical Exam  More alert today.  Does not appear to be in any acute distress. GEN: No acute distress.   Neck: No JVD Cardiac:  Irregular with grade 2/6 holosystolic murmur  at the apex Respiratory: Clear to auscultation bilaterally. GI: Soft, nontender, non-distended  MS: No edema; No deformity.  Diffuse rash unchanged Neuro:  Nonfocal    Labs    Chemistry Recent Labs  Lab 02/26/19 1253  02/27/19 1119 02/28/19 0027 03/01/19 0413  NA 131*   < > 134* 136 135  K 4.5   < > 3.5 3.8 3.6  CL 89*   < > 96* 95* 96*  CO2 19*   < > 24 24 25   GLUCOSE 67*   < > 103* 84 113*  BUN 71*   < > 21* 29* 43*  CREATININE 12.17*   < > 5.58* 6.47* 8.02*  CALCIUM 9.5   < > 7.9* 8.5* 8.7*  PROT 8.0  --  6.7  --   --   ALBUMIN 3.3*  --  2.7*  --   --   AST 46*  --  59*  --   --   ALT 20  --  21  --   --   ALKPHOS 146*  --  106  --   --   BILITOT 1.5*  --  1.5*  --   --  GFRNONAA 4*   < > 10* 9* 7*  GFRAA 5*   < > 12* 10* 8*  ANIONGAP 23*   < > 14 17* 14   < > = values in this interval not displayed.     Hematology Recent Labs  Lab 02/27/19 1119 02/27/19 2035 02/28/19 0027 03/01/19 0413  WBC 15.3*  --  15.7* 12.9*  RBC 2.72*  --  3.05* 3.10*  HGB 7.5* 8.5* 8.5* 8.5*  HCT 21.5* 25.6* 24.7* 24.4*  MCV 79.0*  --  81.0 78.7*  MCH 27.6  --  27.9 27.4  MCHC 34.9  --  34.4 34.8  RDW 15.6*  --  16.7* 16.5*  PLT 188  --  169 178    Cardiac Enzymes Recent Labs  Lab 02/26/19 2116 02/27/19 1119  TROPONINI 0.04* 0.11*   No results for input(s): TROPIPOC in the last 168 hours.   BNPNo results for input(s): BNP, PROBNP in the last 168 hours.   DDimer No results for input(s): DDIMER in the last 168 hours.   Radiology    Mr Wrist Left Wo Contrast  Result Date: 03/02/2019 CLINICAL DATA:  Wrist fracture. Bacteremia. Multiple underlying medical problems including diabetes, vasculitis, cirrhosis and end-stage renal disease. EXAM: MR OF THE LEFT WRIST WITHOUT CONTRAST TECHNIQUE: Multiplanar, multisequence MR imaging of the left wrist was performed. No intravenous contrast was administered. COMPARISON:  Radiographs 02/26/2019 and 02/03/2019. FINDINGS: Despite  efforts by the technologist and patient, severe motion artifact is present on today's exam and could not be eliminated. This reduces exam sensitivity and specificity. The study is nearly nondiagnostic. Ligaments: Suboptimally visualized due to motion. Triangular fibrocartilage: There is a full-thickness tear of the triangular fibrocartilage near the radial attachment, best seen images 9 and 10 of series 11. Tendons: The wrist tendons appear intact without significant tenosynovitis. Carpal tunnel/median nerve: Grossly unremarkable. Guyon's canal: Grossly unremarkable. Joint/cartilage: There is a small to moderate amount of fluid within all of the wrist joint compartments. No gross chondral defect or subchondral signal abnormality. Bones/carpal alignment: Comminuted and impacted fractures of the distal radius and ulna are grossly stable from the prior radiographs. No carpal bone fracture, dislocation or bone destruction identified. Other: There is soft tissue swelling and fluid surrounding the distal forearm fractures, especially volar to the distal radius. IMPRESSION: 1. Significantly limited study due to motion. Detail is significantly limited. 2. No unexpected findings are seen in this patient with known subacute fractures of the distal radius and ulna. No evidence of carpal bone fracture or bone destruction. 3. TFCC tear. Electronically Signed   By: Richardean Sale M.D.   On: 03/02/2019 08:15    Patient Profile     58 y.o. male withpersistentatrial fibrillation,mitral valve disease status post bioprosthetic mitral valve replacement in 2012 at [facility] in [place], tricuspid valve repair,CADwith known RPDA CTO,ESRD on dialysis, OSA, diabetes, HTN, HL, CHF, pulmonary hypertension, anemia, poor adherence with medications  He isnowadmitted with confusion, oral bleeding, supratherapeutic INR, possible sepsis -found to have MSSA bacteremia  Assessment & Plan    1. Wide complex tachycardia/Persistent  Afib:morphologywas reportedsimilar to his baseline RBBB though wider. Now resolved. Electrolytes stable. - started on amiodarone gtt, now transitioned to 400mg  BID (started on 4/12) x2 weeks, then reduce to 200mg  daily. -- INR 6.0 on admission>>3.3>>2.1. Now back on coumadin, PharmD dosing.  2. Bioprosthetic MV: 07/2018 echo LVEF 55-60%, no WMAs. Mild MS, PASP 66. Repeat echothis admission with normal EF, stable MV position with severe tricuspid valve regurg  2/2 to RV enlargement. Severe bi-atrial enlargement.valve in stable position with mean grad 9.65mmHg at time of echo with elevated HR.   3. PYY:FRTMY RPDA CTOfrom cath 10/18. Troponin trend 0.04>>0.11. Suspect demand ischemia in the setting of tachycardia, and hypotension.  4. ESRDon HD: - per nephrology no planned HD today  5. Metabolic encephalopathywith MSSA/Strep bacteremia:ID following. Work up for source? Consideration of HD fistula, also concern over left wrist (MRI negative), endocarditis. echo without noted vegetation on prosthetic. Lactic Acid 3.0>>2.7. With current guarded state, would not be a good candidate for TEE. ID treating appropriately. -antibioticsper primary team/ID.   [facility] will sign off.   Medication Recommendations: Amiodarone 400 mg twice daily x2 weeks, then 200 mg daily thereafter Other recommendations (labs, testing, etc): None Follow up as an outpatient: Will arrange  For questions or updates, please contact [facility] Please consult [email or web address] for contact info under     Signed, Reino Bellis, NP  03/02/2019, 8:48 AM    Patient seen, examined. Available data reviewed. Agree with findings, assessment, and plan as outlined by Reino Bellis, NP.  The findings documented above reflect the findings of my personal examination of this patient today.  His mental status seems to be improved as he is more alert and awake today.  He is not having active chest pain or shortness of  breath.  The patient is back on warfarin.  For prevention of thromboembolism in the setting of persistent atrial fibrillation.  Heart rate control is adequate on amiodarone alone.  Would avoid metoprolol in this patient with chronic hypotension.  Volume management per hemodialysis.  As per previous notes, I do not see any indication to pursue a transesophageal echo in this patient at high risk of complications related to sedation/anesthesia.  Antibiotic recommendations per infectious disease.  Please call if any further issues from a cardiac perspective.  See sign off recommendations outlined above with continued oral loading of amiodarone followed by reduction in amiodarone dose to 200 mg daily in 2 weeks.  Sherren Mocha, M.D. 03/02/2019 3:08 PM

## 2019-03-02 NOTE — Progress Notes (Signed)
ANTICOAGULATION CONSULT NOTE - Follow Up Consult  Pharmacy Consult for Coumadin Indication: PAF/MVR  Allergies  Allergen Reactions  . Triamcinolone Other (See Comments)    Felt like skin was burning    Patient Measurements: Height: 5\' 2"  (157.5 cm) Weight: 144 lb 10 oz (65.6 kg) IBW/kg (Calculated) : 54.6  Vital Signs: Temp: 97.5 F (36.4 C) (04/15 0411) Temp Source: Oral (04/15 0411) BP: 92/64 (04/15 1143) Pulse Rate: 106 (04/15 1143)  Labs: Recent Labs    02/27/19 2035 02/28/19 0027 02/28/19 0924 03/01/19 0413 03/02/19 0442  HGB 8.5* 8.5*  --  8.5*  --   HCT 25.6* 24.7*  --  24.4*  --   PLT  --  169  --  178  --   LABPROT  --   --  23.6* 23.0* 21.3*  INR  --   --  2.1* 2.1* 1.9*  CREATININE  --  6.47*  --  8.02*  --     Estimated Creatinine Clearance: 8.3 mL/min (A) (by C-G formula based on SCr of 8.02 mg/dL (H)).  Assessment: Anticoag: h/o PAF/MVR on Coumadin 2.5mg  daily PTA, s/p MAZE and MVR porcine  in 2011 or 2012. Has had slow, oral bleeding for about a week pta. INR has been elevated now trending down to 1.9. Hgb 8.5, plts wnl. Added Rifampin  Goal of Therapy:  INR 2-3 Monitor platelets by anticoagulation protocol: Yes   Plan:  Give Coumadin 4mg  PO x 1 Monitor daily INR, CBC, s/s of bleed   Jaclyn Carew S. Alford Highland, PharmD, BCPS Clinical Staff Pharmacist Eilene Ghazi Stillinger 03/02/2019,12:12 PM

## 2019-03-02 NOTE — Progress Notes (Signed)
Subjective:  Left wrist still hurts for a fair amount.  Antibiotics:  Anti-infectives (From admission, onward)   Start     Dose/Rate Route Frequency Ordered Stop   03/03/19 2000  gentamicin (GARAMYCIN) 130 mg in dextrose 5 % 50 mL IVPB     130 mg 106.5 mL/hr over 30 Minutes Intravenous Every 48 hours 03/01/19 1339     03/03/19 1800  ceFAZolin (ANCEF) IVPB 2g/100 mL premix     2 g 200 mL/hr over 30 Minutes Intravenous Every Thu (1800) 03/01/19 1321 03/10/19 1759   03/02/19 1800  ceFAZolin (ANCEF) IVPB 2g/100 mL premix  Status:  Discontinued     2 g 200 mL/hr over 30 Minutes Intravenous Every M-W-F-Sa (1800) 02/28/19 1036 03/01/19 1321   03/01/19 2000  gentamicin (GARAMYCIN) 190 mg in dextrose 5 % 50 mL IVPB     190 mg 109.5 mL/hr over 30 Minutes Intravenous  Once 03/01/19 1340 03/01/19 2056   03/01/19 1800  ceFAZolin (ANCEF) IVPB 2g/100 mL premix     2 g 200 mL/hr over 30 Minutes Intravenous  Once 03/01/19 1054 03/01/19 1853   03/01/19 1400  rifampin (RIFADIN) capsule 300 mg     300 mg Oral Every 8 hours 03/01/19 1308     03/01/19 1400  gentamicin (GARAMYCIN) 190 mg in dextrose 5 % 50 mL IVPB  Status:  Discontinued     190 mg 109.5 mL/hr over 30 Minutes Intravenous  Once 03/01/19 1329 03/01/19 1340   02/28/19 1400  ceFAZolin (ANCEF) IVPB 2g/100 mL premix     2 g 200 mL/hr over 30 Minutes Intravenous  Once 02/28/19 1033 02/28/19 1334   02/28/19 1200  vancomycin (VANCOCIN) IVPB 750 mg/150 ml premix  Status:  Discontinued     750 mg 150 mL/hr over 60 Minutes Intravenous Every M-W-F (Hemodialysis) 02/27/19 1223 02/27/19 1258   02/27/19 1315  vancomycin (VANCOCIN) 1,250 mg in sodium chloride 0.9 % 250 mL IVPB  Status:  Discontinued     1,250 mg 166.7 mL/hr over 90 Minutes Intravenous  Once 02/27/19 1222 02/27/19 1258   02/27/19 1200  cefTRIAXone (ROCEPHIN) 2 g in sodium chloride 0.9 % 100 mL IVPB  Status:  Discontinued     2 g 200 mL/hr over 30 Minutes Intravenous Every  24 hours 02/27/19 1108 02/28/19 1033      Medications: Scheduled Meds: . amiodarone  400 mg Oral BID  . calcium acetate  667 mg Oral TID WC  . Chlorhexidine Gluconate Cloth  6 each Topical Q0600  . doxercalciferol  1 mcg Intravenous Q M,W,F-HD  . fluticasone  2 spray Each Nare Daily  . folic acid  1 mg Oral Daily  . loratadine  10 mg Oral Daily  . midodrine  10 mg Oral TID WC  . rifampin  300 mg Oral Q8H  . sodium chloride flush  3 mL Intravenous Q12H  . Warfarin - Pharmacist Dosing Inpatient   Does not apply q1800   Continuous Infusions: . [START ON 03/03/2019]  ceFAZolin (ANCEF) IV    . [START ON 03/03/2019] gentamicin     PRN Meds:.acetaminophen, hydrALAZINE, lidocaine, midodrine, ondansetron **OR** ondansetron (ZOFRAN) IV, oxyCODONE, sodium chloride flush    Objective: Weight change: 0.1 kg  Intake/Output Summary (Last 24 hours) at 03/02/2019 1037 Last data filed at 03/02/2019 0912 Gross per 24 hour  Intake 988.8 ml  Output 0 ml  Net 988.8 ml   Blood pressure (!) 84/65, pulse (!) 108, temperature (!)  97.5 F (36.4 C), temperature source Oral, resp. rate 15, height 5\' 2"  (1.575 m), weight 65.6 kg, SpO2 100 %. Temp:  [97.5 F (36.4 C)-98.5 F (36.9 C)] 97.5 F (36.4 C) (04/15 0411) Pulse Rate:  [100-119] 108 (04/15 0855) Resp:  [13-23] 15 (04/15 0855) BP: (65-97)/(53-71) 84/65 (04/15 0855) SpO2:  [93 %-100 %] 100 % (04/15 0855) Weight:  [64.2 kg-65.6 kg] 65.6 kg (04/15 0454)  Physical Exam: General: Alert and oriented x3 and conversant HEENT: anicteric sclera, EOMI CVS regular rate, normal , murmur at the left upper sternal border and PMI heard chest: , no wheezing, no respiratory distress, lungs are fairly clear Abdomen: soft non-distended,  Extremities: Left wrist bandaged  skin: Extensive rash as documented yesterday. Neuro: nonfocal  CBC:    BMET Recent Labs    02/28/19 0027 03/01/19 0413  NA 136 135  K 3.8 3.6  CL 95* 96*  CO2 24 25  GLUCOSE  84 113*  BUN 29* 43*  CREATININE 6.47* 8.02*  CALCIUM 8.5* 8.7*     Liver Panel  Recent Labs    02/27/19 1119  PROT 6.7  ALBUMIN 2.7*  AST 59*  ALT 21  ALKPHOS 106  BILITOT 1.5*       Sedimentation Rate No results for input(s): ESRSEDRATE in the last 72 hours. C-Reactive Protein No results for input(s): CRP in the last 72 hours.  Micro Results: Recent Results (from the past 720 hour(s))  Blood culture (routine x 2)     Status: None   Collection Time: 02/03/19  6:50 PM  Result Value Ref Range Status   Specimen Description BLOOD RIGHT HAND  Final   Special Requests   Final    BOTTLES DRAWN AEROBIC AND ANAEROBIC Blood Culture adequate volume   Culture   Final    NO GROWTH 5 DAYS Performed at [facility], [address], [place], [place] [address]    Report Status 02/08/2019 FINAL  Final  MRSA PCR Screening     Status: None   Collection Time: 02/03/19  8:37 PM  Result Value Ref Range Status   MRSA by PCR NEGATIVE NEGATIVE Final    Comment:        The GeneXpert MRSA Assay (FDA approved for NASAL specimens only), is one component of a comprehensive MRSA colonization surveillance program. It is not intended to diagnose MRSA infection nor to guide or monitor treatment for MRSA infections. Performed at [facility], [place] [address] [place], [place] [address]   Culture, blood (Routine X 2) w Reflex to ID Panel     Status: Abnormal   Collection Time: 02/26/19  9:00 PM  Result Value Ref Range Status   Specimen Description BLOOD RIGHT HAND  Final   Special Requests   Final    BOTTLES DRAWN AEROBIC ONLY Blood Culture results may not be optimal due to an inadequate volume of blood received in culture bottles   Culture  Setup Time   Final    GRAM POSITIVE COCCI AEROBIC BOTTLE ONLY CRITICAL VALUE NOTED.  VALUE IS CONSISTENT WITH PREVIOUSLY REPORTED AND CALLED VALUE.    Culture (A)  Final    STAPHYLOCOCCUS AUREUS SUSCEPTIBILITIES PERFORMED ON PREVIOUS  CULTURE WITHIN THE LAST 5 DAYS. Performed at [facility], [place] [address] [place], [place] [address]    Report Status 03/01/2019 FINAL  Final  Culture, blood (Routine X 2) w Reflex to ID Panel     Status: Abnormal   Collection  Time: 02/26/19  9:05 PM  Result Value Ref Range Status   Specimen Description BLOOD RIGHT FOREARM  Final   Special Requests   Final    BOTTLES DRAWN AEROBIC ONLY Blood Culture adequate volume   Culture  Setup Time   Final    GRAM POSITIVE COCCI AEROBIC BOTTLE ONLY CRITICAL RESULT CALLED TO, READ BACK BY AND VERIFIED WITH: PHARMD M Ravalli [id number] AT 1054 AM BY CM Performed at [facility], [place] [address], [place], [place] [address]    Culture STAPHYLOCOCCUS AUREUS (A)  Final   Report Status 03/01/2019 FINAL  Final   Organism ID, Bacteria STAPHYLOCOCCUS AUREUS  Final      Susceptibility   Staphylococcus aureus - MIC*    CIPROFLOXACIN <=0.5 SENSITIVE Sensitive     ERYTHROMYCIN <=0.25 SENSITIVE Sensitive     GENTAMICIN <=0.5 SENSITIVE Sensitive     OXACILLIN 0.5 SENSITIVE Sensitive     TETRACYCLINE <=1 SENSITIVE Sensitive     VANCOMYCIN 1 SENSITIVE Sensitive     TRIMETH/SULFA <=10 SENSITIVE Sensitive     CLINDAMYCIN <=0.25 SENSITIVE Sensitive     RIFAMPIN <=0.5 SENSITIVE Sensitive     Inducible Clindamycin NEGATIVE Sensitive     * STAPHYLOCOCCUS AUREUS  Blood Culture ID Panel (Reflexed)     Status: Abnormal   Collection Time: 02/26/19  9:05 PM  Result Value Ref Range Status   Enterococcus species NOT DETECTED NOT DETECTED Final   Listeria monocytogenes NOT DETECTED NOT DETECTED Final   Staphylococcus species DETECTED (A) NOT DETECTED Final    Comment: CRITICAL RESULT CALLED TO, READ BACK BY AND VERIFIED WITH: PHARMD M MACCIA [id number] AT 1056 AM BY CM    Staphylococcus aureus (BCID) DETECTED (A) NOT DETECTED Final    Comment: Methicillin (oxacillin) susceptible Staphylococcus aureus (MSSA). Preferred therapy is anti staphylococcal beta lactam  antibiotic (Cefazolin or Nafcillin), unless clinically contraindicated. CRITICAL RESULT CALLED TO, READ BACK BY AND VERIFIED WITH: PHARMD M Gibbs [id number] AT 42 AM BY CM    Methicillin resistance NOT DETECTED NOT DETECTED Final   Streptococcus species DETECTED (A) NOT DETECTED Final    Comment: Not Enterococcus species, Streptococcus agalactiae, Streptococcus pyogenes, or Streptococcus pneumoniae. CRITICAL RESULT CALLED TO, READ BACK BY AND VERIFIED WITH: PHARMD M [place] [id number] AT 27 AM BY CM    Streptococcus agalactiae NOT DETECTED NOT DETECTED Final   Streptococcus pneumoniae NOT DETECTED NOT DETECTED Final   Streptococcus pyogenes NOT DETECTED NOT DETECTED Final   Acinetobacter baumannii NOT DETECTED NOT DETECTED Final   Enterobacteriaceae species NOT DETECTED NOT DETECTED Final   Enterobacter cloacae complex NOT DETECTED NOT DETECTED Final   Escherichia coli NOT DETECTED NOT DETECTED Final   Klebsiella oxytoca NOT DETECTED NOT DETECTED Final   Klebsiella pneumoniae NOT DETECTED NOT DETECTED Final   Proteus species NOT DETECTED NOT DETECTED Final   Serratia marcescens NOT DETECTED NOT DETECTED Final   Haemophilus influenzae NOT DETECTED NOT DETECTED Final   Neisseria meningitidis NOT DETECTED NOT DETECTED Final   Pseudomonas aeruginosa NOT DETECTED NOT DETECTED Final   Candida albicans NOT DETECTED NOT DETECTED Final   Candida glabrata NOT DETECTED NOT DETECTED Final   Candida krusei NOT DETECTED NOT DETECTED Final   Candida parapsilosis NOT DETECTED NOT DETECTED Final   Candida tropicalis NOT DETECTED NOT DETECTED Final    Comment: Performed at [facility], [address] [place], [place] [address]  Culture, blood (Routine X 2) w Reflex to ID Panel  Status: None (Preliminary result)   Collection Time: 02/28/19  8:50 AM  Result Value Ref Range Status   Specimen Description BLOOD RIGHT HAND  Final   Special Requests   Final    BOTTLES DRAWN AEROBIC ONLY Blood  Culture results may not be optimal due to an inadequate volume of blood received in culture bottles   Culture   Final    NO GROWTH 1 DAY Performed at [facility], [place] [address] [place], [place] [address]    Report Status PENDING  Incomplete  Culture, blood (Routine X 2) w Reflex to ID Panel     Status: None (Preliminary result)   Collection Time: 02/28/19  8:55 AM  Result Value Ref Range Status   Specimen Description BLOOD RIGHT HAND  Final   Special Requests   Final    BOTTLES DRAWN AEROBIC ONLY Blood Culture results may not be optimal due to an inadequate volume of blood received in culture bottles   Culture   Final    NO GROWTH 1 DAY Performed at [facility], [place] [address] [place], [place] [address]    Report Status PENDING  Incomplete    Studies/Results: Mr Wrist Left Wo Contrast  Result Date: 03/02/2019 CLINICAL DATA:  Wrist fracture. Bacteremia. Multiple underlying medical problems including diabetes, vasculitis, cirrhosis and end-stage renal disease. EXAM: MR OF THE LEFT WRIST WITHOUT CONTRAST TECHNIQUE: Multiplanar, multisequence MR imaging of the left wrist was performed. No intravenous contrast was administered. COMPARISON:  Radiographs 02/26/2019 and 02/03/2019. FINDINGS: Despite efforts by the technologist and patient, severe motion artifact is present on today's exam and could not be eliminated. This reduces exam sensitivity and specificity. The study is nearly nondiagnostic. Ligaments: Suboptimally visualized due to motion. Triangular fibrocartilage: There is a full-thickness tear of the triangular fibrocartilage near the radial attachment, best seen images 9 and 10 of series 11. Tendons: The wrist tendons appear intact without significant tenosynovitis. Carpal tunnel/median nerve: Grossly unremarkable. Guyon's canal: Grossly unremarkable. Joint/cartilage: There is a small to moderate amount of fluid within all of the wrist joint compartments. No gross chondral  defect or subchondral signal abnormality. Bones/carpal alignment: Comminuted and impacted fractures of the distal radius and ulna are grossly stable from the prior radiographs. No carpal bone fracture, dislocation or bone destruction identified. Other: There is soft tissue swelling and fluid surrounding the distal forearm fractures, especially volar to the distal radius. IMPRESSION: 1. Significantly limited study due to motion. Detail is significantly limited. 2. No unexpected findings are seen in this patient with known subacute fractures of the distal radius and ulna. No evidence of carpal bone fracture or bone destruction. 3. TFCC tear. Electronically Signed   By: Richardean Sale M.D.   On: 03/02/2019 08:15      Assessment/Plan:  INTERVAL HISTORY: 2D echocardiogram did not show vegetation Active Problems:   Oral bleeding   Encephalopathy   Wrist fracture, closed, left, with routine healing, subsequent encounter   Metabolic encephalopathy   Bacteremia   Fracture   Hypoglycemia   MSSA bacteremia   Prosthetic valve endocarditis ([place])    Michael [name] is a 58 y.o. male with  Afib(s/p MAZE and MVR in 2011 and TV repair, CHF, Cirrhosis, Pulm HTN,DM,ESRD via fistula now admitted with MSSAB (BCID also ID a strep species but it is not growing) with concern for PVEndocarditis  #1 MSSAB + possible streptococcal species, concern for PVE.        [facility] Staphylococcus  aureus bacteremia   Staphylococcus aureus bacteremia (SAB) is associated with a high rate of complications and mortality.  Specific aspects of clinical management are critical to optimizing the outcome of patients with SAB.  Therefore, the [facility] Antimicrobial Management Team [facility]) has initiated an intervention aimed at improving the management of SAB at [facility].  To do so, Infectious Diseases physicians are providing an evidence-based consult for the management of all patients  with SAB.     Yes No Comments  Perform follow-up blood cultures (even if the patient is afebrile) to ensure clearance of bacteremia [x]  []   no growth at   Remove vascular catheter and obtain follow-up blood cultures after the removal of the catheter []  []    Perform echocardiography to evaluate for endocarditis (transthoracic ECHO is 40-50% sensitive, TEE is > 90% sensitive) [x]  []   no vegetations by 2D echocardiogram  Consult electrophysiologist to evaluate implanted cardiac device (pacemaker, ICD) []  []   N/A  Ensure source control []  []  Have all abscesses been drained effectively? Have deep seeded infections (septic joints or osteomyelitis) had appropriate surgical debridement?  Have concerns that site of wrist fracture could be infected, will get MRI of wrist was without evidence of infection.  WOULD EVALUATE pleural effusion  I will order CT chest to start with  Also monitor fistula  Investigate for "metastatic" sites of infection []  []  Does the patient have ANY symptom or physical exam finding that would suggest a deeper infection (back or neck pain that may be suggestive of vertebral osteomyelitis or epidural abscess, muscle pain that could be a symptom of pyomyositis)?  Keep in mind that for deep seeded infections MRI imaging with contrast is preferred rather than other often insensitive tests such as plain x-rays, especially early in a patient's presentation.    See above regarding concerns of wrist being infected  Also consider diagnostic thoracocentesis  Change antibiotic therapy to cefazolin gentamicin and rifampin  []  []  Beta-lactam antibiotics are preferred for MSSA due to higher cure rates.   If on Vancomycin, goal trough should be 15 - 20 mcg/mL  Estimated duration of IV antibiotic therapy: 6 weeks of cerfazolin and rifampin with 2 weeks of gentamicin []  []  Consult case management for probably prolonged outpatient IV antibiotic therapy   #2 Wrist fracture: no osteo here   #3 Pleural effusion: will get CT to evaluate this and I would recommend get asking IR to aspirate the pleural fluid so they can analyze for LDH protein, cell count differential culture.  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  His daughter and his daughters friend with whom he lives or not attics.  I have told him that continue to use the drugs is not good for him and also puts him at risk of having unnecessary interactions that could expose him to coronavirus 2019.  I do wonder if his methamphetamines keep his blood pressure higher than it is in the hospital  Dr. Prince Rome to take over the service tomorrow.  LOS: 4 days   Michael [name] 03/02/2019, 10:37 AM

## 2019-03-02 NOTE — Progress Notes (Signed)
[facility] Progress Note   Assessment/ Plan:     [facility] [place]  4h  3K/2.25 bath Hep none  63kg  Hep none  LUA AVF 300/800 - leaving below the past two Rx's   - hectorol 1  - mircera 30 q 2 weeks - last 4/6 - Recent labs: h hgb 11.1 4/1 INR 5.98 4/6 41% sat Ca 8.8 P 1.8 ipTH 175 alb 3.6   Assessment/Plan: 1. MSSA bacteremia - getting abx (ancef/ gent / rifampin) per ID 2. H/o prosthetic mitral valve 3. AMS - due to infection, better 4. Volume - up 2kg today 5. Hypotension - acute on chronic, better, on midodrine 10 tid  6. ESRD on HD : HD tomorrow then Sat then resume MWFSat.  7. Oral bleeding: from recent tooth extraction/ Jeannie Done. Better.  8. Wide complex tachycardia/ hx atrial fib: s/p amiodarone bolus, now in NSR.  History of Afib as well 9.  Anemia ckd - hgb 8's, stable.  range, last tsat ok. Due for esa on 4/21.  10.  Metabolic bone disease -  Resume hectorol Monday - last P low - no binders for now 11.  Nutrition - renal diet  12. DM - per primary 13. 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 Substance abuse - hx of meth use - drug screen pending 15. Left wrist fx - last month  16. H/o Cirrhosis 17. EOL - per conversation w/ pt's daughter 4/13 patient recently told her he would want everything done in case of arrest but doesn't want prolonged life support if not doing well.   Subjective:    Looks better again today, responding, BP's 80's- 90's.  No c/o.   Objective:   BP 92/64   Pulse (!) 106   Temp (!) 97.5 F (36.4 C) (Oral)   Resp 11   Ht 5\' 2"  (1.575 m)   Wt 65.6 kg   SpO2 99%   BMI 26.45 kg/m   Physical Exam: Gen: lying in bed, responsinve, hard to understand CVS: irregular Resp: clear anteriorly and lat Abd: slightly distended Ext: no LE edema Skin: diffuse chronic  petechial rash LE's and torso LUA AVF+bruit  Labs: BMET Recent Labs  Lab 02/26/19 1253 02/26/19 2116  02/27/19 1119 02/28/19 0027 03/01/19 0413  NA 131* 131* 134* 136 135  K 4.5 4.8 3.5 3.8 3.6  CL 89* 92* 96* 95* 96*  CO2 19* 20* 24 24 25   GLUCOSE 67* 70 103* 84 113*  BUN 71* 77* 21* 29* 43*  CREATININE 12.17* 12.06* 5.58* 6.47* 8.02*  CALCIUM 9.5 9.1 7.9* 8.5* 8.7*   CBC Recent Labs  Lab 02/26/19 1253 02/27/19 1119 02/27/19 2035 02/28/19 0027 03/01/19 0413  WBC 13.7* 15.3*  --  15.7* 12.9*  NEUTROABS 12.1*  --   --   --   --   HGB 9.7* 7.5* 8.5* 8.5* 8.5*  HCT 28.5* 21.5* 25.6* 24.7* 24.4*  MCV 80.3 79.0*  --  81.0 78.7*  PLT 213 188  --  169 178    @IMGRELPRIORS @ Medications:    . amiodarone  400 mg Oral BID  . calcium acetate  667 mg Oral TID WC  . Chlorhexidine Gluconate Cloth  6 each Topical Q0600  . doxercalciferol  1 mcg Intravenous Q M,W,F-HD  . fluticasone  2 spray Each Nare Daily  . folic acid  1 mg Oral Daily  . loratadine  10 mg Oral  Daily  . midodrine  10 mg Oral TID WC  . rifampin  300 mg Oral Q8H  . sodium chloride flush  3 mL Intravenous Q12H  . warfarin  4 mg Oral ONCE-1800  . Warfarin - Pharmacist Dosing Inpatient   Does not apply [phone]

## 2019-03-03 DIAGNOSIS — J181 Lobar pneumonia, unspecified organism: Secondary | ICD-10-CM

## 2019-03-03 LAB — T4, FREE: Free T4: 1.14 ng/dL (ref 0.82–1.77)

## 2019-03-03 LAB — RENAL FUNCTION PANEL
Albumin: 2.2 g/dL — ABNORMAL LOW (ref 3.5–5.0)
Anion gap: 15 (ref 5–15)
BUN: 35 mg/dL — ABNORMAL HIGH (ref 6–20)
CO2: 22 mmol/L (ref 22–32)
Calcium: 8.7 mg/dL — ABNORMAL LOW (ref 8.9–10.3)
Chloride: 93 mmol/L — ABNORMAL LOW (ref 98–111)
Creatinine, Ser: 6.3 mg/dL — ABNORMAL HIGH (ref 0.61–1.24)
GFR calc Af Amer: 10 mL/min — ABNORMAL LOW (ref >60)
GFR calc non Af Amer: 9 mL/min — ABNORMAL LOW (ref >60)
Glucose, Bld: 100 mg/dL — ABNORMAL HIGH (ref 70–99)
Phosphorus: 1.7 mg/dL — ABNORMAL LOW (ref 2.5–4.6)
Potassium: 4.4 mmol/L (ref 3.5–5.1)
Sodium: 130 mmol/L — ABNORMAL LOW (ref 135–145)

## 2019-03-03 LAB — CBC
HCT: 22.2 % — ABNORMAL LOW (ref 39.0–52.0)
Hemoglobin: 7.9 g/dL — ABNORMAL LOW (ref 13.0–17.0)
MCH: 27 pg (ref 26.0–34.0)
MCHC: 35.6 g/dL (ref 30.0–36.0)
MCV: 75.8 fL — ABNORMAL LOW (ref 80.0–100.0)
Platelets: 156 10*3/uL (ref 150–400)
RBC: 2.93 MIL/uL — ABNORMAL LOW (ref 4.22–5.81)
RDW: 15.9 % — ABNORMAL HIGH (ref 11.5–15.5)
WBC: 14.2 10*3/uL — ABNORMAL HIGH (ref 4.0–10.5)
nRBC: 0 % (ref 0.0–0.2)

## 2019-03-03 LAB — MAGNESIUM: Magnesium: 2.1 mg/dL (ref 1.7–2.4)

## 2019-03-03 LAB — PROTIME-INR
INR: 2.9 — ABNORMAL HIGH (ref 0.8–1.2)
Prothrombin Time: 29.5 seconds — ABNORMAL HIGH (ref 11.4–15.2)

## 2019-03-03 LAB — GENTAMICIN LEVEL, RANDOM: Gentamicin Rm: 4 ug/mL

## 2019-03-03 LAB — AMMONIA: Ammonia: 26 umol/L (ref 9–35)

## 2019-03-03 LAB — HEMOGLOBIN A1C
Hgb A1c MFr Bld: 4.6 % — ABNORMAL LOW (ref 4.8–5.6)
Mean Plasma Glucose: 85.32 mg/dL

## 2019-03-03 LAB — TSH: TSH: 1.441 u[IU]/mL (ref 0.350–4.500)

## 2019-03-03 MED ORDER — MIDODRINE HCL 5 MG PO TABS
ORAL_TABLET | ORAL | Status: AC
  Filled 2019-03-03: qty 2

## 2019-03-03 MED ORDER — GENTAMICIN SULFATE 40 MG/ML IJ SOLN
1.0000 mg/kg | Freq: Once | INTRAVENOUS | Status: AC
  Administered 2019-03-03: 70 mg via INTRAVENOUS
  Filled 2019-03-03: qty 1.75

## 2019-03-03 MED ORDER — ORAL CARE MOUTH RINSE
15.0000 mL | Freq: Two times a day (BID) | OROMUCOSAL | Status: DC
  Administered 2019-03-03 – 2019-03-07 (×10): 15 mL via OROMUCOSAL

## 2019-03-03 NOTE — Evaluation (Signed)
Physical Therapy Evaluation Patient Details Name: Michael [name] MRN: [id number] DOB: 1961/08/06 Today's Date: 03/03/2019   History of Present Illness  58 y.o. 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  Work up includes resolved encephalopathy suspected due to MSSA bacteremia, wrist fx/?infection, prosthetic valve endocarditis.  Clinical Impression  Pt admitted with/for confusion and agitation.  Pt also has a subacute L forearm fx.  At this point, pt needs moderate or more assist to mobilize.  He is vague on whether he has suitable assist at home.  .  Pt currently limited functionally due to the problems listed. ( See problems list.)   Pt will benefit from PT to maximize function and safety in order to get ready for next venue listed below.     Follow Up Recommendations SNF;Supervision/Assistance - 24 hour;Other (comment)(sounds like doesn't have adequate assist,  HHPT if has assist from girlfriend or family.)    Equipment Recommendations       Recommendations for Other Services       Precautions / Restrictions Precautions Precautions: Fall Restrictions Weight Bearing Restrictions: No      Mobility  Bed Mobility Overal bed mobility: Needs Assistance Bed Mobility: Supine to Sit;Sit to Supine;Rolling Rolling: Mod assist   Supine to sit: Mod assist Sit to supine: Mod assist;+2 for physical assistance   General bed mobility comments: stabilize trunk and L UE due to pain.  Pt assisted with R UE and legs.  Transfers Overall transfer level: Needs assistance   Transfers: Sit to/from Stand Sit to Stand: Mod assist         General transfer comment: face to face assist to stand for finishing hygiene and bed change  due to large stool.  Ambulation/Gait             General Gait Details: pt deferred  Stairs            Wheelchair Mobility    Modified Rankin (Stroke Patients Only)       Balance Overall balance assessment: Needs assistance   Sitting balance-Leahy Scale: Poor Sitting balance - Comments: tendency for posterior lean even with 1 UE assist   Standing balance support: Single extremity supported Standing balance-Leahy Scale: Poor                               Pertinent Vitals/Pain Pain Assessment: Faces Faces Pain Scale: Hurts whole lot Pain Location: right arm Pain Descriptors / Indicators: Discomfort;Guarding;Moaning Pain Intervention(s): Monitored during session;Limited activity within patient's tolerance    Home Living Family/patient expects to be discharged to:: Skilled nursing facility   Available Help at Discharge: Family Type of Home: House Home Access: Stairs to enter Entrance Stairs-Rails: Can reach both Entrance Stairs-Number of Steps: 3 Home Layout: One level Home Equipment: Cane - single point;Shower seat;Other (comment);Grab bars - tub/shower;Hand held shower head      Prior Function Level of Independence: Independent with assistive device(s)         Comments: lives with roommate and her kids; only uses [facility] after dialysis     Hand Dominance   Dominant Hand: Right    Extremity/Trunk Assessment   Upper Extremity Assessment Upper Extremity Assessment: Defer to OT evaluation    Lower Extremity Assessment Lower  Extremity Assessment: RLE deficits/detail;LLE deficits/detail RLE Deficits / Details: general weakness, but able to bear weight well LLE Deficits / Details: general weakness, but able to bear weight well. LLE Coordination: decreased fine motor       Communication   Communication: Prefers language other than English  Cognition Arousal/Alertness: Awake/alert;Lethargic Behavior During Therapy: Flat affect Overall  Cognitive Status: No family/caregiver present to determine baseline cognitive functioning(functional with following basic commands in english)                                        General Comments      Exercises     Assessment/Plan    PT Assessment Patient needs continued PT services  PT Problem List Decreased strength;Decreased activity tolerance;Decreased balance;Decreased mobility;Pain;Decreased knowledge of use of DME       PT Treatment Interventions DME instruction;Gait training;Stair training;Functional mobility training;Therapeutic activities;Balance training;Patient/family education    PT Goals (Current goals can be found in the Care Plan section)  Acute Rehab PT Goals PT Goal Formulation: Patient unable to participate in goal setting Time For Goal Achievement: 03/17/19 Potential to Achieve Goals: Good    Frequency Min 3X/week   Barriers to discharge        Co-evaluation               AM-PAC PT "6 Clicks" Mobility  Outcome Measure Help needed turning from your back to your side while in a flat bed without using bedrails?: A Lot Help needed moving from lying on your back to sitting on the side of a flat bed without using bedrails?: A Lot Help needed moving to and from a bed to a chair (including a wheelchair)?: Total Help needed standing up from a chair using your arms (e.g., wheelchair or bedside chair)?: A Lot Help needed to walk in hospital room?: Total Help needed climbing 3-5 steps with a railing? : Total 6 Click Score: 9    End of Session   Activity Tolerance: Patient tolerated treatment well;Patient limited by pain Patient left: in bed;with call bell/phone within reach;with bed alarm set Nurse Communication: Mobility status PT Visit Diagnosis: Other abnormalities of gait and mobility (R26.89);Muscle weakness (generalized) (M62.81);Pain Pain - Right/Left: Left Pain - part of body: Arm    Time: 1710-1740 PT Time Calculation  (min) (ACUTE ONLY): 30 min   Charges:   PT Evaluation $PT Eval Moderate Complexity: 1 Mod PT Treatments $Therapeutic Activity: 8-22 mins        03/03/2019  Donnella Sham, PT Acute Rehabilitation Services [phone]  (pager) [phone]  (office)  Tessie Fass Matina Rodier 03/03/2019, 6:16 PM

## 2019-03-03 NOTE — Progress Notes (Signed)
OT Cancellation Note  Patient Details Name: Addiel Mccardle MRN: [id number] DOB: 1961/09/01   Cancelled Treatment:    Reason Eval/Treat Not Completed: Patient at procedure or test/ unavailable(HD). Will follow.  Malka So 03/03/2019, 8:21 AM  Nestor Lewandowsky, OTR/L Acute Rehabilitation Services Pager: [phone] Office: [phone]

## 2019-03-03 NOTE — Progress Notes (Signed)
[facility] Progress Note   Assessment/ Plan:     [facility]  4h  3K/2.25 bath Hep none  63kg  Hep none  LUA AVF 300/800 - leaving below the past two Rx's   - hectorol 1  - mircera 30 q 2 weeks - last 4/6 - Recent labs: h hgb 11.1 4/1 INR 5.98 4/6 41% sat Ca 8.8 P 1.8 ipTH 175 alb 3.6   Assessment/Plan: 1. MSSA bacteremia - possible asp PNA. Wrist MRI neg. Possible endocarditis given prosthetic valve. Per ID is getting mult abx (ancef/ gent / rifampin).  2. H/o prosthetic mitral valve 3. AMS - due to infection, back to baseline 4. Volume - up 2kg today 5. Hypotension - baseline SBP 80's- 90's, on midodrine 10 tid  6. ESRD on HD : HD today then Sat then resume MWFSat.  7. Oral bleeding: resolved 8. Atrial fib: s/p amiodarone bolus. Still in afib.  9.  Anemia ckd - hgb high 7's - 8's. Last tsat ok. Due for esa 4/21.  10.  Metabolic bone disease -  Resume hectorol Monday - last P low - no binders for now 11.  Nutrition - renal diet  12. DM - per primary 13. 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 Substance abuse - hx of meth use - drug screen pending 15. Left wrist fx - last month  16. H/o Cirrhosis 17. EOL - full code but doesn't want prolonged life support  Subjective:    Continues to improve. On HD this am. No new c/o.    Objective:   BP (!) 84/63 (BP Location: Right Arm)   Pulse 96   Temp 98.5 F (36.9 C) (Oral)   Resp 16   Ht 5\' 2"  (1.575 m)   Wt 65.3 kg   SpO2 100%   BMI 26.33 kg/m   Physical Exam: Gen: lying in bed, responsinve, hard to understand CVS: irregular Resp: clear anteriorly and lat Abd: slightly distended Ext: no LE edema Skin: diffuse chronic  petechial rash LE's and torso LUA AVF+bruit  Labs: BMET Recent Labs  Lab 02/26/19 1253 02/26/19 2116 02/27/19 1119 02/28/19 0027 03/01/19 0413 03/03/19 0338  NA 131* 131* 134* 136 135 130*  K 4.5 4.8 3.5 3.8 3.6 4.4   CL 89* 92* 96* 95* 96* 93*  CO2 19* 20* 24 24 25 22   GLUCOSE 67* 70 103* 84 113* 100*  BUN 71* 77* 21* 29* 43* 35*  CREATININE 12.17* 12.06* 5.58* 6.47* 8.02* 6.30*  CALCIUM 9.5 9.1 7.9* 8.5* 8.7* 8.7*  PHOS  --   --   --   --   --  1.7*   CBC Recent Labs  Lab 02/26/19 1253 02/27/19 1119 02/27/19 2035 02/28/19 0027 03/01/19 0413 03/03/19 0338  WBC 13.7* 15.3*  --  15.7* 12.9* 14.2*  NEUTROABS 12.1*  --   --   --   --   --   HGB 9.7* 7.5* 8.5* 8.5* 8.5* 7.9*  HCT 28.5* 21.5* 25.6* 24.7* 24.4* 22.2*  MCV 80.3 79.0*  --  81.0 78.7* 75.8*  PLT 213 188  --  169 178 156    @IMGRELPRIORS @ Medications:    . amiodarone  400 mg Oral BID  . calcium acetate  667 mg Oral TID WC  . Chlorhexidine Gluconate Cloth  6 each Topical Q0600  . doxercalciferol  1 mcg Intravenous Q M,W,F-HD  . fluticasone  2 spray  Each Nare Daily  . folic acid  1 mg Oral Daily  . loratadine  10 mg Oral Daily  . mouth rinse  15 mL Mouth Rinse BID  . midodrine      . midodrine  10 mg Oral TID WC  . rifampin  300 mg Oral Q8H  . sodium chloride flush  3 mL Intravenous Q12H  . Warfarin - Pharmacist Dosing Inpatient   Does not apply [phone]

## 2019-03-03 NOTE — Progress Notes (Addendum)
ANTICOAGULATION CONSULT NOTE - Follow Up Consult  Pharmacy Consult for Coumadin + Gent Indication: PAF/MVR, abx synergy  Allergies  Allergen Reactions  . Triamcinolone Other (See Comments)    Felt like skin was burning    Patient Measurements: Height: 5\' 2"  (157.5 cm) Weight: 150 lb 2.1 oz (68.1 kg) IBW/kg (Calculated) : 54.6  Vital Signs: Temp: 98.4 F (36.9 C) (04/16 0740) Temp Source: Oral (04/16 0740) BP: 81/53 (04/16 1000) Pulse Rate: 89 (04/16 1000)  Labs: Recent Labs    03/01/19 0413 03/02/19 0442 03/03/19 0338  HGB 8.5*  --  7.9*  HCT 24.4*  --  22.2*  PLT 178  --  156  LABPROT 23.0* 21.3* 29.5*  INR 2.1* 1.9* 2.9*  CREATININE 8.02*  --  6.30*    Estimated Creatinine Clearance: 10.7 mL/min (A) (by C-G formula based on SCr of 6.3 mg/dL (H)).  Assessment:  Anticoag: h/o PAF/MVR on Coumadin 2.5mg  daily PTA, s/p MAZE and MVR porcine  in 2011 or 2012. Has had slow, oral bleeding for about a week pta.  Hgb 8.5>7.9, plts wnl. Added Rifampin. INR 1.9>2.9 overnight?  ID: BCID resulted 4/11 MSSA strep. LA 3>2.7 (4/13)-  Afebrile, WBC 14.2 up. ESRD. No vegetations noted.  Vanc 4/12>>4/12 Ceftriaxone 4/12>>4/13 Ancef 4/13> Gent 4/16 (3mg /kg x 1 now 2mg /kg)>> - 4/16 Pre-HD Gen level 4 in goal  Goal of Therapy:  INR 2-3 Monitor platelets by anticoagulation protocol: Yes   Plan:  Hold Coumadin tonight Monitor daily INR, CBC, s/s of bleed Gentamicin 3 mg/kg X 1 then 2 mg/kg every 48 hours  Jerell Demery S. Alford Highland, PharmD, BCPS Clinical Staff Pharmacist Eilene Ghazi Stillinger 03/03/2019,10:25 AM

## 2019-03-03 NOTE — Progress Notes (Signed)
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 Apparently patient was yelling at his house and was found very confused and not making sense. He missed dialysis multiple times and has had slow, bleeding for about a week and INR was checked and found to be elevated.   In ED, confused and agitated. Cr 12.17, BUN 71, glucose 67, ALP 146, WBC 13.7, hemoglobin 9.7, INR 6.0, PT 38.9. CT of the head with no acute findings. Rectal temperature 100.2.  Blood cultures obtained.  Started on vancomycin and ceftriaxone for sepsis and admitted.  Patient was found to have MSSA and possible streptococcal bacteremia.  ID consulted.  Antibiotic narrowed to Ancef, rifampin and gentamicin.  Initially concerned about possible left wrist infection to be the source of his bacteremia.  However, MRI did not show evidence of infection but known fractures and TFC tear.  CT chest obtained on 4/15 by ID and showed RUL and RLL pneumonia with right moderate pleural effusion.   Subjective: No major events overnight of this morning.  He is on HD.  Somewhat somnolent.  Minimally follows command such as opening his eyes.   Assessment & Plan: Active Problems:   Oral bleeding   Encephalopathy   Wrist fracture, closed, left, with routine healing, subsequent encounter   Metabolic encephalopathy   Bacteremia   Fracture   Hypoglycemia   MSSA bacteremia   Prosthetic valve endocarditis ([place])  Acute metabolic encephalopathy: this worse compared to yesterday. No focal neuro deficit.  CT head without acute intracranial finding.  Suspect encephalopathy to be due to infectious process.   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  Patient's daughter denies history of cognitive impairment. -Treated treatable causes -UDS ordered but not sent. -Check ammonia, thyroid panel, VBG and lactic acid -Delirium precautions -PT/OT when he is able to participate.  MSSA and possible streptococcal bacteremia:  RUL/RLL pneumonia-noted on CT chest on 4/15. ?Aspiration Moderate right pleural effusion-likely due to pneumonia as above -2D echo without vegetation.  Not a candidate for TEE per cardiology due to hemodynamics -ID treating for presumed endocarditis with Ancef, gentamicin and rifampin given prosthetic mitral valve.  -MRI of left wrist without evidence of infection.. -Follow CT chest-ordered by ID. -Repeat blood cultures from 02/28/2019- so far -Vancomycin and ceftriaxone 4/12. -Ancef 4/13-- -rifampin 4/14-- -gentamicin/14-- -May need adjustment for anaerobic coverage now with RLL and RUL pneumonia-ID on board.  Stable left distal radius and ulna fractures/TFC tear: Subacute.  This was noted on MRI of left wrist on 4/14 and x-ray on 02/03/2019 -Supportive care. -Pain management  ESRD: On HD MWF at [facility] -Nephrology following  Abdominal pain: Seems to have resolved.  CT abdomen and pelvis without acute finding but cholelithiasis without cholecystitis, staghorn renal calculus in the L renal pelvis without hydronephrosis and  cirrhosis.  Supratherapeutic INR: Elevated to 6.0 on admission.  Coumadin held.  INR therapeutic at 2.9. -Pharmacy managing warfarin -Daily PT/INR  Anemia of chronic disease: anemia panel not reliable in acute setting.  No obvious signs of bleeding. -Per nephrology -Continue monitoring  Chronic atrial fibrillation WCT: Not in RVR.  Heart rate in low 100s. -Cardiology and EP involved -Continue  amiodarone and warfarin -Metoprolol on hold due to hypotension  Hypertension: Continues to have soft blood pressures -Continue holding  metoprolol -Continue home Midodrin  DM-2 with renal complication: CBG fairly controlled.  Does not seem to be medication. -Check A1c -CBG monitoring and sliding scale insulin -Continue home statin  History of leukocytoclastic vasculitis/chronic macular rash: areas of hyperpigmented and erythematous macules on lower extremities bilaterally.  Stable. -Outpatient follow-up  Scheduled Meds: . amiodarone  400 mg Oral BID  . calcium acetate  667 mg Oral TID WC  . Chlorhexidine Gluconate Cloth  6 each Topical Q0600  . doxercalciferol  1 mcg Intravenous Q M,W,F-HD  . fluticasone  2 spray Each Nare Daily  . folic acid  1 mg Oral Daily  . loratadine  10 mg Oral Daily  . mouth rinse  15 mL Mouth Rinse BID  . midodrine      . midodrine  10 mg Oral TID WC  . rifampin  300 mg Oral Q8H  . sodium chloride flush  3 mL Intravenous Q12H  . Warfarin - Pharmacist Dosing Inpatient   Does not apply q1800   Continuous Infusions: .  ceFAZolin (ANCEF) IV    . [START ON 03/07/2019]  ceFAZolin (ANCEF) IV    . gentamicin     PRN Meds:.acetaminophen, hydrALAZINE, lidocaine, midodrine, ondansetron **OR** ondansetron (ZOFRAN) IV, oxyCODONE, sodium chloride flush, white petrolatum  DVT prophylaxis: On warfarin for atrial fibrillation Code Status: Full code Family Communication: None at bedside.  Called and updated daughter over the phone on 4/15 Disposition Plan: Remains inpatient for treatment of bacteremia and endocarditis with IV antibiotics. Final disposition will be determined after PT/OT eval. Daughter concerned about living place and asks for placement  Consultants:   Infectious disease  Nephrology  Cardiology  Procedures:   None  Antimicrobials:  As above  Objective: Vitals:   03/03/19 1030 03/03/19 1100 03/03/19 1115 03/03/19 1130  BP: (!) 90/51 (!) 78/53 (!) 68/46 (!) 81/49  Pulse: 96 87 96 98  Resp:      Temp:      TempSrc:      SpO2:      Weight:      Height:         Intake/Output Summary (Last 24 hours) at 03/03/2019 1222 Last data filed at 03/02/2019 2140 Gross per 24 hour  Intake 478 ml  Output -  Net 478 ml   Filed Weights   03/02/19 0454 03/03/19 0515 03/03/19 0740  Weight: 65.6 kg 68.1 kg 68.1 kg    Examination:  GENERAL: No acute distress but somnolent. EYES - vision grossly intact. Sclera anicteric.  NOSE- no gross deformity or drainage MOUTH -chapped lips THROAT- no swelling or erythema LUNGS:  No IWOB.  Fair air movement bilaterally. HEART: RRR. Heart sounds normal.  2/6 SEM over RUSB ABD: Bowel sounds present. Soft. Non tender.  MSK/EXT: Moves extremities. No obvious deformity.  Left arm wrapped in Ace wrap.  Some swelling proximally. SKIN: Scattered macular and erythematous lesions over lower extremities NEURO: Somnolent.  Opens eyes and responds to voice.  No apparent focal neuro deficit.  Further exam limited due to patient's inability to cooperate. PSYCH: Somnolent.  Data Reviewed: I have independently reviewed following labs and imaging studies  CBC: Recent Labs  Lab 02/26/19 1253 02/27/19 1119 02/27/19 2035 02/28/19 0027 03/01/19 0413 03/03/19 0338  WBC 13.7* 15.3*  --  15.7* 12.9* 14.2*  NEUTROABS 12.1*  --   --   --   --   --  HGB 9.7* 7.5* 8.5* 8.5* 8.5* 7.9*  HCT 28.5* 21.5* 25.6* 24.7* 24.4* 22.2*  MCV 80.3 79.0*  --  81.0 78.7* 75.8*  PLT 213 188  --  169 178 440   Basic Metabolic Panel: Recent Labs  Lab 02/26/19 2116 02/27/19 1119 02/28/19 0027 03/01/19 0413 03/03/19 0338  NA 131* 134* 136 135 130*  K 4.8 3.5 3.8 3.6 4.4  CL 92* 96* 95* 96* 93*  CO2 20* 24 24 25 22   GLUCOSE 70 103* 84 113* 100*  BUN 77* 21* 29* 43* 35*  CREATININE 12.06* 5.58* 6.47* 8.02* 6.30*  CALCIUM 9.1 7.9* 8.5* 8.7* 8.7*  MG 2.2  --   --   --  2.1  PHOS  --   --   --   --  1.7*   GFR: Estimated Creatinine Clearance: 10.7 mL/min (A) (by C-G formula based on SCr of 6.3 mg/dL (H)). Liver Function Tests: Recent Labs   Lab 02/26/19 1253 02/27/19 1119 03/03/19 0338  AST 46* 59*  --   ALT 20 21  --   ALKPHOS 146* 106  --   BILITOT 1.5* 1.5*  --   PROT 8.0 6.7  --   ALBUMIN 3.3* 2.7* 2.2*   No results for input(s): LIPASE, AMYLASE in the last 168 hours. No results for input(s): AMMONIA in the last 168 hours. Coagulation Profile: Recent Labs  Lab 02/27/19 1119 02/28/19 0924 03/01/19 0413 03/02/19 0442 03/03/19 0338  INR 3.3* 2.1* 2.1* 1.9* 2.9*   Cardiac Enzymes: Recent Labs  Lab 02/26/19 2116 02/27/19 1119  TROPONINI 0.04* 0.11*   BNP (last 3 results) No results for input(s): PROBNP in the last 8760 hours. HbA1C: No results for input(s): HGBA1C in the last 72 hours. CBG: Recent Labs  Lab 02/27/19 0030 02/27/19 0223 02/27/19 0555 02/27/19 0621 02/27/19 0705  GLUCAP 65* 60* 55* 71 70   Lipid Profile: No results for input(s): CHOL, HDL, LDLCALC, TRIG, CHOLHDL, LDLDIRECT in the last 72 hours. Thyroid Function Tests: No results for input(s): TSH, T4TOTAL, FREET4, T3FREE, THYROIDAB in the last 72 hours. Anemia Panel: No results for input(s): VITAMINB12, FOLATE, FERRITIN, TIBC, IRON, RETICCTPCT in the last 72 hours. Urine analysis:    Component Value Date/Time   COLORURINE AMBER (A) 08/11/2018 1943   APPEARANCEUR CLOUDY (A) 08/11/2018 1943   LABSPEC 1.021 08/11/2018 1943   PHURINE 5.0 08/11/2018 1943   GLUCOSEU NEGATIVE 08/11/2018 1943   HGBUR LARGE (A) 08/11/2018 [place] NEGATIVE 08/11/2018 1943   KETONESUR 5 (A) 08/11/2018 1943   PROTEINUR 100 (A) 08/11/2018 1943   NITRITE NEGATIVE 08/11/2018 1943   LEUKOCYTESUR MODERATE (A) 08/11/2018 1943   Sepsis Labs: Invalid input(s): PROCALCITONIN, LACTICIDVEN  Recent Results (from the past 240 hour(s))  Culture, blood (Routine X 2) w Reflex to ID Panel     Status: Abnormal   Collection Time: 02/26/19  9:00 PM  Result Value Ref Range Status   Specimen Description BLOOD RIGHT HAND  Final   Special Requests   Final     BOTTLES DRAWN AEROBIC ONLY Blood Culture results may not be optimal due to an inadequate volume of blood received in culture bottles   Culture  Setup Time   Final    GRAM POSITIVE COCCI AEROBIC BOTTLE ONLY CRITICAL VALUE NOTED.  VALUE IS CONSISTENT WITH PREVIOUSLY REPORTED AND CALLED VALUE.    Culture (A)  Final    STAPHYLOCOCCUS AUREUS SUSCEPTIBILITIES PERFORMED ON PREVIOUS CULTURE WITHIN THE LAST 5 DAYS. Performed at [facility], [name] [address], [place], [place] [address]    Report Status 03/01/2019 FINAL  Final  Culture, blood (Routine X 2) w Reflex to ID Panel     Status: Abnormal   Collection Time: 02/26/19  9:05 PM  Result Value Ref Range Status   Specimen Description BLOOD RIGHT FOREARM  Final   Special Requests   Final    BOTTLES DRAWN AEROBIC ONLY Blood Culture adequate volume   Culture  Setup Time   Final    GRAM POSITIVE COCCI AEROBIC BOTTLE ONLY CRITICAL RESULT CALLED TO, READ BACK BY AND VERIFIED WITH: [place] [id number] AT 1054 AM BY CM Performed at [facility], [place] [address] [place],  [address]    Culture STAPHYLOCOCCUS AUREUS (A)  Final   Report Status 03/01/2019 FINAL  Final   Organism ID, Bacteria STAPHYLOCOCCUS AUREUS  Final      Susceptibility   Staphylococcus aureus - MIC*    CIPROFLOXACIN <=0.5 SENSITIVE Sensitive     ERYTHROMYCIN <=0.25 SENSITIVE Sensitive     GENTAMICIN <=0.5 SENSITIVE Sensitive     OXACILLIN 0.5 SENSITIVE Sensitive     TETRACYCLINE <=1 SENSITIVE Sensitive     VANCOMYCIN 1 SENSITIVE Sensitive     TRIMETH/SULFA <=10 SENSITIVE Sensitive     CLINDAMYCIN <=0.25 SENSITIVE Sensitive     RIFAMPIN <=0.5 SENSITIVE Sensitive     Inducible Clindamycin NEGATIVE Sensitive     * STAPHYLOCOCCUS AUREUS  Blood Culture ID Panel (Reflexed)     Status: Abnormal   Collection Time: 02/26/19  9:05 PM  Result Value Ref Range Status   Enterococcus species NOT DETECTED NOT DETECTED Final   Listeria monocytogenes NOT DETECTED NOT  DETECTED Final   Staphylococcus species DETECTED (A) NOT DETECTED Final    Comment: CRITICAL RESULT CALLED TO, READ BACK BY AND VERIFIED WITH: PHARMD M MACCIA [id number] AT 1056 AM BY CM    Staphylococcus aureus (BCID) DETECTED (A) NOT DETECTED Final    Comment: Methicillin (oxacillin) susceptible Staphylococcus aureus (MSSA). Preferred therapy is anti staphylococcal beta lactam antibiotic (Cefazolin or Nafcillin), unless clinically contraindicated. CRITICAL RESULT CALLED TO, READ BACK BY AND VERIFIED WITH: PHARMD M [place] [id number] AT 80 AM BY CM    Methicillin resistance NOT DETECTED NOT DETECTED Final   Streptococcus species DETECTED (A) NOT DETECTED Final    Comment: Not Enterococcus species, Streptococcus agalactiae, Streptococcus pyogenes, or Streptococcus pneumoniae. CRITICAL RESULT CALLED TO, READ BACK BY AND VERIFIED WITH: PHARMD M [place] [id number] AT 19 AM BY CM    Streptococcus agalactiae NOT DETECTED NOT DETECTED Final   Streptococcus pneumoniae NOT DETECTED NOT DETECTED Final   Streptococcus pyogenes NOT DETECTED NOT DETECTED Final   Acinetobacter baumannii NOT DETECTED NOT DETECTED Final   Enterobacteriaceae species NOT DETECTED NOT DETECTED Final   Enterobacter cloacae complex NOT DETECTED NOT DETECTED Final   Escherichia coli NOT DETECTED NOT DETECTED Final   Klebsiella oxytoca NOT DETECTED NOT DETECTED Final   Klebsiella pneumoniae NOT DETECTED NOT DETECTED Final   Proteus species NOT DETECTED NOT DETECTED Final   Serratia marcescens NOT DETECTED NOT DETECTED Final   Haemophilus influenzae NOT DETECTED NOT DETECTED Final   Neisseria meningitidis NOT DETECTED NOT DETECTED Final   Pseudomonas aeruginosa NOT DETECTED NOT DETECTED Final   Candida albicans NOT DETECTED NOT DETECTED Final   Candida glabrata NOT DETECTED NOT DETECTED Final   Candida krusei NOT DETECTED NOT DETECTED Final   Candida parapsilosis NOT DETECTED NOT DETECTED Final  Candida tropicalis NOT DETECTED NOT  DETECTED Final    Comment: Performed at [facility], [place] [address], [place], [place] [address]  Culture, blood (Routine X 2) w Reflex to ID Panel     Status: None (Preliminary result)   Collection Time: 02/28/19  8:50 AM  Result Value Ref Range Status   Specimen Description BLOOD RIGHT HAND  Final   Special Requests   Final    BOTTLES DRAWN AEROBIC ONLY Blood Culture results may not be optimal due to an inadequate volume of blood received in culture bottles   Culture   Final    NO GROWTH 3 DAYS Performed at [facility], [place] [address], [place], [place] [address]    Report Status PENDING  Incomplete  Culture, blood (Routine X 2) w Reflex to ID Panel     Status: None (Preliminary result)   Collection Time: 02/28/19  8:55 AM  Result Value Ref Range Status   Specimen Description BLOOD RIGHT HAND  Final   Special Requests   Final    BOTTLES DRAWN AEROBIC ONLY Blood Culture results may not be optimal due to an inadequate volume of blood received in culture bottles   Culture   Final    NO GROWTH 3 DAYS Performed at [facility], [place] [address], [place], [place] [address]    Report Status PENDING  Incomplete      Radiology Studies: Ct Chest Wo Contrast  Result Date: 03/02/2019 CLINICAL DATA:  Pleural effusion EXAM: CT CHEST WITHOUT CONTRAST TECHNIQUE: Multidetector CT imaging of the chest was performed following the standard protocol without IV contrast. COMPARISON:  Chest x-ray 02/26/2019 FINDINGS: Cardiovascular: No significant vascular findings. Enlarged heart size. No pericardial effusion. Thoracic aortic atherosclerosis. Mediastinum/Nodes: No axillary lymphadenopathy. No hilar lymphadenopathy. Mild mediastinal lymphadenopathy with the largest right paratracheal lymph node measuring 12 mm in short axis. Precarinal lymph node measures 18 mm in short axis. Esophagus, thyroid gland and trachea are normal. Lungs/Pleura: Moderate right pleural effusion with right basilar  airspace disease which may reflect atelectasis versus pneumonia. Right upper lobe airspace disease. small left lower lobe pleural effusion. No pneumothorax. Mild bilateral interstitial thickening. Upper Abdomen: No acute abnormality. Musculoskeletal: No acute osseous abnormality. No aggressive osseous lesion. Chronic T11 vertebral body compression fracture. IMPRESSION: 1. Moderate right pleural effusion with right upper lobe and lower lobe airspace disease concerning for pneumonia. 2. Small left pleural effusion. 3. Likely an element of underlying mild CHF. 4.  Aortic Atherosclerosis (ICD10-I70.0). Electronically Signed   By: Kathreen Devoid   On: 03/02/2019 15:08    Mahreen Schewe T. [facility] Pager [phone]  If 7PM-7AM, please contact night-coverage [email or web address] Password [facility] 03/03/2019, 12:22 PM

## 2019-03-03 NOTE — Progress Notes (Addendum)
PT Cancellation Note  Patient Details Name: Michael [name] MRN: [id number] DOB: [birth year]   Cancelled Treatment:    Reason Eval/Treat Not Completed: Patient at procedure or test/unavailable(HD)  Attempted again at 12;15 but still in HD  [place] 03/03/2019, 8:15 AM [place] Pager: [phone] Office: [phone]

## 2019-03-03 NOTE — Progress Notes (Signed)
Pharmacy Antibiotic Note  Michael [name] is a 58 y.o. 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 Pharmacy has been consulted for gentamicin dosing. Patient is ESRD with HD MWFSa.  Patient had an HD session this morning. He tolerated approximately 4 hours of hemodialysis with a BFR of 300 (Slightly below goal of 350-400). A pre-HD gent level was drawn today and resulted at 4. Predicted post-dialysis level ~ 1.2.   Plan: Gentamicin (~ 1 mg/kg) 70 mg once  today  Can likely increase to 1.5 mg/kg if tolerating full dialysis session at BFR 350-400     Height: 5\' 2"  (157.5 cm) Weight: 143 lb 15.4 oz (65.3 kg) IBW/kg (Calculated) : 54.6  Temp (24hrs), Avg:98.4 F (36.9 C), Min:98 F (36.7 C), Max:99 F (37.2 C)  Recent Labs  Lab 02/26/19 1253 02/26/19 2116 02/27/19 1119 02/27/19 2040 02/28/19 0027 03/01/19 0413 03/03/19 0338 03/03/19 0852  WBC 13.7*  --  15.3*  --  15.7* 12.9* 14.2*  --   CREATININE 12.17* 12.06* 5.58*  --  6.47* 8.02* 6.30*  --   LATICACIDVEN  --   --   --  3.0* 2.7*  --   --   --   GENTRANDOM  --   --   --   --   --   --   --  4.0    Estimated Creatinine Clearance: 9.8 mL/min (A) (by C-G formula based on SCr of 6.3 mg/dL (H)).    Allergies  Allergen Reactions  . Triamcinolone Other (See Comments)    Felt like skin was burning    Antimicrobials this admission: 4/13 Ancef>> 4/14 Rifampin >> 4/14 Gent >>    Thank you for allowing pharmacy to be a part of this patient's care.  Jimmy Footman, PharmD, BCPS, BCIDP Infectious Diseases Clinical Pharmacist Phone: [phone] 03/03/2019 1:38 PM

## 2019-03-04 LAB — CBC
HCT: 22.5 % — ABNORMAL LOW (ref 39.0–52.0)
Hemoglobin: 7.8 g/dL — ABNORMAL LOW (ref 13.0–17.0)
MCH: 26.4 pg (ref 26.0–34.0)
MCHC: 34.7 g/dL (ref 30.0–36.0)
MCV: 76 fL — ABNORMAL LOW (ref 80.0–100.0)
Platelets: 155 10*3/uL (ref 150–400)
RBC: 2.96 MIL/uL — ABNORMAL LOW (ref 4.22–5.81)
RDW: 15.5 % (ref 11.5–15.5)
WBC: 12.1 10*3/uL — ABNORMAL HIGH (ref 4.0–10.5)
nRBC: 0 % (ref 0.0–0.2)

## 2019-03-04 LAB — LACTIC ACID, PLASMA
Lactic Acid, Venous: 2.2 mmol/L (ref 0.5–1.9)
Lactic Acid, Venous: 2.2 mmol/L (ref 0.5–1.9)

## 2019-03-04 LAB — PROTIME-INR
INR: 5.3 (ref 0.8–1.2)
Prothrombin Time: 47.4 seconds — ABNORMAL HIGH (ref 11.4–15.2)

## 2019-03-04 LAB — MAGNESIUM: Magnesium: 2 mg/dL (ref 1.7–2.4)

## 2019-03-04 MED ORDER — DOXERCALCIFEROL 4 MCG/2ML IV SOLN
1.0000 ug | INTRAVENOUS | Status: DC
  Filled 2019-03-04: qty 2

## 2019-03-04 MED ORDER — DOXERCALCIFEROL 4 MCG/2ML IV SOLN: 1.0000 ug | INTRAVENOUS | Status: DC

## 2019-03-04 MED ORDER — CHLORHEXIDINE GLUCONATE CLOTH 2 % EX PADS
6.0000 | MEDICATED_PAD | Freq: Every day | CUTANEOUS | Status: DC
  Administered 2019-03-04 – 2019-03-08 (×3): 6 via TOPICAL

## 2019-03-04 NOTE — Progress Notes (Signed)
[facility] Progress Note   Assessment/ Plan:     [place]FS [place]  4h  3K/2.25 bath Hep none  63kg  Hep none  LUA AVF 300/800 - leaving below the past two Rx's   - hectorol 1  - mircera 30 q 2 weeks - last 4/6 - Recent labs: h hgb 11.1 4/1 INR 5.98 4/6 41% sat Ca 8.8 P 1.8 ipTH 175 alb 3.6   Assessment/Plan: 1. MSSA bacteremia - possible asp PNA. Wrist MRI neg. Possible endocarditis given prosthetic valve. Per ID is getting mult abx (ancef/ gent / rifampin). He is still very sick but making daily progress. I questioned patient again today about his wishes in case of arrest and he does not want resuscitation/ heroic attempts, have confirmed this on the phone 3-way with his daughter.  Have written DNR order.  2. H/o prosthetic mitral valve 3. AMS - due to infection, back to baseline 4. Volume - up 2kg today 5. Hypotension - baseline SBP 80's- 90's, on midodrine 10 tid  6. ESRD on HD : HD today then Sat then resume MWFSat.  7. Oral bleeding: resolved 8. Atrial fib: s/p amiodarone bolus. Still in afib.  9.  Anemia ckd - hgb high 7's - 8's. Last tsat ok. Due for esa 4/21.  10.  Metabolic bone disease -  Resume hectorol Monday - last P low - no binders for now 11.  Nutrition - renal diet  12. DM - per primary 13. Chronic rash / leukocytoclastic vasculitis - stable - has had this for some time now - no heparin HD 14. Substance abuse - hx of meth use - drug screen pending 15. Left wrist fx - last month  16. H/o Cirrhosis 17. EOL - full code but doesn't want prolonged life support  Subjective:    Continues to improve. On HD this am. No new c/o.    Objective:   BP (!) 86/62 (BP Location: Right Arm)   Pulse (!) 112   Temp 98.1 F (36.7 C) (Oral)   Resp (!) 23   Ht 5\' 2"  (1.575 m)   Wt 66.3 kg   SpO2 97%   BMI 26.73 kg/m   Physical Exam: Gen: lying in bed, responsinve, hard to understand CVS: irregular Resp: clear anteriorly and lat Abd: slightly distended Ext: no  LE edema Skin: diffuse chronic  petechial rash LE's and torso LUA AVF+bruit  Labs: BMET Recent Labs  Lab 02/26/19 1253 02/26/19 2116 02/27/19 1119 02/28/19 0027 03/01/19 0413 03/03/19 0338 03/04/19 0308  NA 131* 131* 134* 136 135 130* 127*  K 4.5 4.8 3.5 3.8 3.6 4.4 3.7  CL 89* 92* 96* 95* 96* 93* 91*  CO2 19* 20* 24 24 25 22 26   GLUCOSE 67* 70 103* 84 113* 100* 100*  BUN 71* 77* 21* 29* 43* 35* 16  CREATININE 12.17* 12.06* 5.58* 6.47* 8.02* 6.30* 3.97*  CALCIUM 9.5 9.1 7.9* 8.5* 8.7* 8.7* 8.2*  PHOS  --   --   --   --   --  1.7* 1.6*   CBC Recent Labs  Lab 02/26/19 1253  02/28/19 0027 03/01/19 0413 03/03/19 0338 03/04/19 0308  WBC 13.7*   < > 15.7* 12.9* 14.2* 12.1*  NEUTROABS 12.1*  --   --   --   --   --   HGB 9.7*   < > 8.5* 8.5* 7.9* 7.8*  HCT 28.5*   < > 24.7* 24.4* 22.2* 22.5*  MCV 80.3   < >  81.0 78.7* 75.8* 76.0*  PLT 213   < > 169 178 156 155   < > = values in this interval not displayed.    @IMGRELPRIORS @ Medications:    . amiodarone  400 mg Oral BID  . calcium acetate  667 mg Oral TID WC  . Chlorhexidine Gluconate Cloth  6 each Topical Q0600  . doxercalciferol  1 mcg Intravenous Q M,W,F-HD  . fluticasone  2 spray Each Nare Daily  . folic acid  1 mg Oral Daily  . loratadine  10 mg Oral Daily  . mouth rinse  15 mL Mouth Rinse BID  . midodrine  10 mg Oral TID WC  . rifampin  300 mg Oral Q8H  . sodium chloride flush  3 mL Intravenous Q12H  . Warfarin - Pharmacist Dosing Inpatient   Does not apply [phone]

## 2019-03-04 NOTE — Progress Notes (Signed)
ANTICOAGULATION CONSULT NOTE - Follow Up Consult  Pharmacy Consult for Coumadin + Gent Indication: PAF/MVR, abx synergy  Allergies  Allergen Reactions  . Triamcinolone Other (See Comments)    Felt like skin was burning    Patient Measurements: Height: 5\' 2"  (157.5 cm) Weight: 146 lb 2.6 oz (66.3 kg) IBW/kg (Calculated) : 54.6  Vital Signs: Temp: 99.3 F (37.4 C) (04/17 0828) Temp Source: Oral (04/17 0828) BP: 85/60 (04/17 0828) Pulse Rate: 113 (04/17 0828)  Labs: Recent Labs    03/02/19 0442 03/03/19 0338 03/04/19 0308  HGB  --  7.9* 7.8*  HCT  --  22.2* 22.5*  PLT  --  156 155  LABPROT 21.3* 29.5* 47.4*  INR 1.9* 2.9* 5.3*  CREATININE  --  6.30* 3.97*    Estimated Creatinine Clearance: 16.8 mL/min (A) (by C-G formula based on SCr of 3.97 mg/dL (H)).  Assessment: 58 yo male with: h/o PAF/MVR on Coumadin 2.5mg  daily PTA, s/p MAZE and MVR porcine. Pharmacy dosing coumadin. He is on amiodarone and rifampin which will greatly influence has coumadin requirements -INR 1.9>> 2.0>> 5.3 (likely d/t amio; started 4/11)   Goal of Therapy:  INR 2-3 Monitor platelets by anticoagulation protocol: Yes   Plan:  -Hold Coumadin tonight -Monitor daily INR  Hildred Laser, PharmD Clinical Pharmacist **Pharmacist phone directory can now be found on [email or web address] ([email or web address]).  Listed under [place].

## 2019-03-04 NOTE — Care Management Important Message (Signed)
Important Message  Patient Details  Name: Michael [name] MRN: [id number] Date of Birth: Sep 21, 1961   Medicare Important Message Given:  Yes    [place]a Sarabia 03/04/2019, 3:20 PM

## 2019-03-04 NOTE — Progress Notes (Signed)
CRITICAL VALUE ALERT  Critical Value:  Lactic acid 2.2  Date & Time Notied:  03/04/2019  Provider Notified: Dr. Cyndia Skeeters  Orders Received/Actions taken: no new orders received

## 2019-03-04 NOTE — Progress Notes (Signed)
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 Apparently patient was yelling at his house and was found very confused and not making sense. He missed dialysis multiple times and has had slow, bleeding for about a week and INR was checked and found to be elevated.   In ED, confused and agitated. Cr 12.17, BUN 71, glucose 67, ALP 146, WBC 13.7, hemoglobin 9.7, INR 6.0, PT 38.9. CT of the head with no acute findings. Rectal temperature 100.2.  Blood cultures obtained.  Started on vancomycin and ceftriaxone for sepsis and admitted.  Patient was found to have MSSA and possible streptococcal bacteremia.  ID consulted.  Antibiotic narrowed to Ancef, rifampin and gentamicin.  Initially concerned about possible left wrist infection to be the source of his bacteremia.  However, MRI did not show evidence of infection but known fractures and TFC tear.  CT chest obtained on 4/15 by ID and showed RUL and RLL pneumonia with right moderate pleural effusion.   Thoracocentesis ordered on 4/17  Subjective: No major events overnight of this morning.  He is on HD.  Somewhat somnolent.  Minimally follows command such as opening his eyes.   Assessment & Plan: Active Problems:   Oral bleeding   Encephalopathy   Wrist fracture, closed, left, with routine healing, subsequent encounter   Metabolic encephalopathy   Bacteremia   Fracture   Hypoglycemia   MSSA bacteremia   Prosthetic valve endocarditis (HCC)  Acute metabolic encephalopathy: Slightly improved from yesterday.  No focal neuro deficit.  CT head without acute intracranial finding.  Suspect encephalopathy  to be due to infectious process. 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  Patient's daughter denies history of cognitive impairment.  He was also uremic on admission that has improved.  Ammonia and thyroid panel within normal range. -Treated treatable causes -UDS ordered but he is anuric. -Delirium precautions -PT/OT-SNF  MSSA and possible streptococcal bacteremia: Multiple potential sources including HD access, bioprosthetic heart valve and pneumonia. MRI of left wrist without evidence of infection. RUL/RLL pneumonia-noted on CT chest on 4/15. ?Aspiration Moderate right pleural effusion-likely due to pneumonia as above -2D echo without vegetation.  Not a candidate for TEE per cardiology due to hemodynamics.  -ID treating for presumed endocarditis with Ancef, gentamicin and rifampin given prosthetic mitral valve.  -Repeat blood cultures from 02/28/2019- so far -Vancomycin and ceftriaxone 4/12. -Ancef 4/13-- -rifampin 4/14-- -gentamicin/14-- -?Anaerobic coverage -IR thoracocentesis ordered-May have to wait until INR is therapeutic.  Stable left distal radius and ulna fractures/TFC tear: Subacute.  This was noted on MRI of left wrist on 4/14 and x-ray on 02/03/2019 -Supportive care. -Pain management  Chronic atrial fibrillation/WCT:  Heart rate in low 100s.  Morphology of WCT reported to be similar to his baseline RBBB though wider.  -Cardiology -signed off. -Amiodarone 400 mg twice daily 4/12-4/26, then 200 mg daily per cardiologist recommendation. -Pharmacy managing warfarin.  INR supratherapeutic likely due to concomitant rifampin. -Metoprolol on hold due to hypotension  ESRD: On HD MWF at [facility] -Nephrology following  History of CAD with known RPDA CTO from cath in 10/18 -Continue home medications.  Abdominal pain: Seems to have resolved.  CT abdomen and pelvis without acute finding but cholelithiasis without cholecystitis, staghorn renal calculus in the L  renal pelvis without hydronephrosis and  cirrhosis.  Anemia of chronic disease: anemia panel not reliable in acute setting.  No obvious signs of bleeding. -Per nephrology -Continue monitoring  Hypertension: Continues to have soft blood pressures.  Baseline SBP in 80s and 90s. -Continue holding metoprolol -Continue home Midodrine  DM-2 with renal complication: H4L 4.6.  CBG has been fairly controlled. -Discontinue sliding scale and CBG monitoring  History of leukocytoclastic vasculitis/chronic macular rash: areas of hyperpigmented and erythematous macules on lower extremities bilaterally.  Stable. -Outpatient follow-up  Scheduled Meds:  amiodarone  400 mg Oral BID   calcium acetate  667 mg Oral TID WC   Chlorhexidine Gluconate Cloth  6 each Topical Q0600   Chlorhexidine Gluconate Cloth  6 each Topical Q0600   [START ON 03/05/2019] doxercalciferol  1 mcg Intravenous Q T,Th,Sa-HD   doxercalciferol  1 mcg Intravenous Q M,W,F-HD   fluticasone  2 spray Each Nare Daily   folic acid  1 mg Oral Daily   loratadine  10 mg Oral Daily   mouth rinse  15 mL Mouth Rinse BID   midodrine  10 mg Oral TID WC   rifampin  300 mg Oral Q8H   sodium chloride flush  3 mL Intravenous Q12H   Warfarin - Pharmacist Dosing Inpatient   Does not apply q1800   Continuous Infusions:   ceFAZolin (ANCEF) IV 2 g (03/03/19 1408)   [START ON 03/07/2019]  ceFAZolin (ANCEF) IV     PRN Meds:.acetaminophen, hydrALAZINE, lidocaine, midodrine, ondansetron **OR** ondansetron (ZOFRAN) IV, oxyCODONE, sodium chloride flush, white petrolatum  DVT prophylaxis: On warfarin for atrial fibrillation Code Status: Full code Family Communication: None at bedside.   -Called and updated daughter over the phone on 4/15  Disposition Plan: Remains inpatient for treatment of bacteremia and endocarditis with IV antibiotics pending improvement.  Final disposition SNF when clinically stable.  Consultants:   Infectious  disease  Nephrology  Cardiology  IR  Procedures:   None  Antimicrobials:  As above  Objective: Vitals:   03/04/19 0549 03/04/19 0828 03/04/19 0854 03/04/19 1131  BP:  (!) 85/60  (!) 86/62  Pulse:  (!) 113 (!) 107 (!) 112  Resp:  19 14 (!) 23  Temp:  99.3 F (37.4 C)  98.1 F (36.7 C)  TempSrc:  Oral  Oral  SpO2:  98%  97%  Weight: 66.3 kg     Height:        Intake/Output Summary (Last 24 hours) at 03/04/2019 1243 Last data filed at 03/04/2019 1141 Gross per 24 hour  Intake 600 ml  Output --  Net 600 ml   Filed Weights   03/03/19 0740 03/03/19 1151 03/04/19 0549  Weight: 68.1 kg 65.3 kg 66.3 kg    Examination: GENERAL: Appears well. No acute distress.  HEENT: MMM.  Vision and Hearing grossly intact.  NECK: Supple.  No JVD.  LUNGS:  No IWOB.  Fair air movement bilaterally. HEART: Tachycardic. Heart sounds normal.  2/6 SEM over RUSB ABD: Bowel sounds present. Soft.  Tenderness out of proportion over his left chest, epigastric area, left arm EXT: Moves extremities.  Left arm wrapped in Ace wrap with some swelling proximally but no erythema.  Cap refills brisk in all fingers bilaterally SKIN: no apparent skin lesion.  NEURO: Awake, oriented x4 but appears drowsy.  No apparent focal neuro deficit. PSYCH: Calm. Normal affect.  Data  Reviewed: I have independently reviewed following labs and imaging studies  CBC: Recent Labs  Lab 02/26/19 1253 02/27/19 1119 02/27/19 2035 02/28/19 0027 03/01/19 0413 03/03/19 0338 03/04/19 0308  WBC 13.7* 15.3*  --  15.7* 12.9* 14.2* 12.1*  NEUTROABS 12.1*  --   --   --   --   --   --   HGB 9.7* 7.5* 8.5* 8.5* 8.5* 7.9* 7.8*  HCT 28.5* 21.5* 25.6* 24.7* 24.4* 22.2* 22.5*  MCV 80.3 79.0*  --  81.0 78.7* 75.8* 76.0*  PLT 213 188  --  169 178 156 175   Basic Metabolic Panel: Recent Labs  Lab 02/26/19 2116 02/27/19 1119 02/28/19 0027 03/01/19 0413 03/03/19 0338 03/04/19 0308  NA 131* 134* 136 135 130* 127*  K 4.8 3.5  3.8 3.6 4.4 3.7  CL 92* 96* 95* 96* 93* 91*  CO2 20* 24 24 25 22 26   GLUCOSE 70 103* 84 113* 100* 100*  BUN 77* 21* 29* 43* 35* 16  CREATININE 12.06* 5.58* 6.47* 8.02* 6.30* 3.97*  CALCIUM 9.1 7.9* 8.5* 8.7* 8.7* 8.2*  MG 2.2  --   --   --  2.1 2.0  PHOS  --   --   --   --  1.7* 1.6*   GFR: Estimated Creatinine Clearance: 16.8 mL/min (A) (by C-G formula based on SCr of 3.97 mg/dL (H)). Liver Function Tests: Recent Labs  Lab 02/26/19 1253 02/27/19 1119 03/03/19 0338 03/04/19 0308  AST 46* 59*  --   --   ALT 20 21  --   --   ALKPHOS 146* 106  --   --   BILITOT 1.5* 1.5*  --   --   PROT 8.0 6.7  --   --   ALBUMIN 3.3* 2.7* 2.2* 2.1*   No results for input(s): LIPASE, AMYLASE in the last 168 hours. Recent Labs  Lab 03/03/19 1448  AMMONIA 26   Coagulation Profile: Recent Labs  Lab 02/28/19 0924 03/01/19 0413 03/02/19 0442 03/03/19 0338 03/04/19 0308  INR 2.1* 2.1* 1.9* 2.9* 5.3*   Cardiac Enzymes: Recent Labs  Lab 02/26/19 2116 02/27/19 1119  TROPONINI 0.04* 0.11*   BNP (last 3 results) No results for input(s): PROBNP in the last 8760 hours. HbA1C: Recent Labs    03/03/19 1448  HGBA1C 4.6*   CBG: Recent Labs  Lab 02/27/19 0030 02/27/19 0223 02/27/19 0555 02/27/19 0621 02/27/19 0705  GLUCAP 65* 60* 55* 71 70   Lipid Profile: No results for input(s): CHOL, HDL, LDLCALC, TRIG, CHOLHDL, LDLDIRECT in the last 72 hours. Thyroid Function Tests: Recent Labs    03/03/19 1448  TSH 1.441  FREET4 1.14   Anemia Panel: No results for input(s): VITAMINB12, FOLATE, FERRITIN, TIBC, IRON, RETICCTPCT in the last 72 hours. Urine analysis:    Component Value Date/Time   COLORURINE AMBER (A) 08/11/2018 1943   APPEARANCEUR CLOUDY (A) 08/11/2018 1943   LABSPEC 1.021 08/11/2018 1943   PHURINE 5.0 08/11/2018 1943   GLUCOSEU NEGATIVE 08/11/2018 1943   HGBUR LARGE (A) 08/11/2018 [place] NEGATIVE 08/11/2018 1943   KETONESUR 5 (A) 08/11/2018 1943    PROTEINUR 100 (A) 08/11/2018 1943   NITRITE NEGATIVE 08/11/2018 1943   LEUKOCYTESUR MODERATE (A) 08/11/2018 1943   Sepsis Labs: Invalid input(s): PROCALCITONIN, LACTICIDVEN  Recent Results (from the past 240 hour(s))  Culture, blood (Routine X 2) w Reflex to ID Panel     Status: Abnormal   Collection Time: 02/26/19  9:00 PM  Result  Value Ref Range Status   Specimen Description BLOOD RIGHT HAND  Final   Special Requests   Final    BOTTLES DRAWN AEROBIC ONLY Blood Culture results may not be optimal due to an inadequate volume of blood received in culture bottles   Culture  Setup Time   Final    GRAM POSITIVE COCCI AEROBIC BOTTLE ONLY CRITICAL VALUE NOTED.  VALUE IS CONSISTENT WITH PREVIOUSLY REPORTED AND CALLED VALUE.    Culture (A)  Final    STAPHYLOCOCCUS AUREUS SUSCEPTIBILITIES PERFORMED ON PREVIOUS CULTURE WITHIN THE LAST 5 DAYS. Performed at [facility], [place] [address], [place], [place] [address]    Report Status 03/01/2019 FINAL  Final  Culture, blood (Routine X 2) w Reflex to ID Panel     Status: Abnormal   Collection Time: 02/26/19  9:05 PM  Result Value Ref Range Status   Specimen Description BLOOD RIGHT FOREARM  Final   Special Requests   Final    BOTTLES DRAWN AEROBIC ONLY Blood Culture adequate volume   Culture  Setup Time   Final    GRAM POSITIVE COCCI AEROBIC BOTTLE ONLY CRITICAL RESULT CALLED TO, READ BACK BY AND VERIFIED WITH: [place] [id number] AT 1054 AM BY CM Performed at [facility], [place] [address], [place],  [address]    Culture STAPHYLOCOCCUS AUREUS (A)  Final   Report Status 03/01/2019 FINAL  Final   Organism ID, Bacteria STAPHYLOCOCCUS AUREUS  Final      Susceptibility   Staphylococcus aureus - MIC*    CIPROFLOXACIN <=0.5 SENSITIVE Sensitive     ERYTHROMYCIN <=0.25 SENSITIVE Sensitive     GENTAMICIN <=0.5 SENSITIVE Sensitive     OXACILLIN 0.5 SENSITIVE Sensitive     TETRACYCLINE <=1 SENSITIVE Sensitive     VANCOMYCIN 1  SENSITIVE Sensitive     TRIMETH/SULFA <=10 SENSITIVE Sensitive     CLINDAMYCIN <=0.25 SENSITIVE Sensitive     RIFAMPIN <=0.5 SENSITIVE Sensitive     Inducible Clindamycin NEGATIVE Sensitive     * STAPHYLOCOCCUS AUREUS  Blood Culture ID Panel (Reflexed)     Status: Abnormal   Collection Time: 02/26/19  9:05 PM  Result Value Ref Range Status   Enterococcus species NOT DETECTED NOT DETECTED Final   Listeria monocytogenes NOT DETECTED NOT DETECTED Final   Staphylococcus species DETECTED (A) NOT DETECTED Final    Comment: CRITICAL RESULT CALLED TO, READ BACK BY AND VERIFIED WITH: PHARMD M MACC[place] [id number] AT 1056 AM BY CM    Staphylococcus aureus (BCID) DETECTED (A) NOT DETECTED Final    Comment: Methicillin (oxacillin) susceptible Staphylococcus aureus (MSSA). Preferred therapy is anti staphylococcal beta lactam antibiotic (Cefazolin or Nafcillin), unless clinically contraindicated. CRITICAL RESULT CALLED TO, READ BACK BY AND VERIFIED WITH: PHARMD M [place] [id number] AT 23 AM BY CM    Methicillin resistance NOT DETECTED NOT DETECTED Final   Streptococcus species DETECTED (A) NOT DETECTED Final    Comment: Not Enterococcus species, Streptococcus agalactiae, Streptococcus pyogenes, or Streptococcus pneumoniae. CRITICAL RESULT CALLED TO, READ BACK BY AND VERIFIED WITH: PHARMD M [place] [id number] AT 52 AM BY CM    Streptococcus agalactiae NOT DETECTED NOT DETECTED Final   Streptococcus pneumoniae NOT DETECTED NOT DETECTED Final   Streptococcus pyogenes NOT DETECTED NOT DETECTED Final   Acinetobacter baumannii NOT DETECTED NOT DETECTED Final   Enterobacteriaceae species NOT DETECTED NOT DETECTED Final   Enterobacter cloacae complex NOT DETECTED NOT DETECTED Final   Escherichia coli NOT DETECTED NOT DETECTED Final  Klebsiella oxytoca NOT DETECTED NOT DETECTED Final   Klebsiella pneumoniae NOT DETECTED NOT DETECTED Final   Proteus species NOT DETECTED NOT DETECTED Final   Serratia marcescens NOT  DETECTED NOT DETECTED Final   Haemophilus influenzae NOT DETECTED NOT DETECTED Final   Neisseria meningitidis NOT DETECTED NOT DETECTED Final   Pseudomonas aeruginosa NOT DETECTED NOT DETECTED Final   Candida albicans NOT DETECTED NOT DETECTED Final   Candida glabrata NOT DETECTED NOT DETECTED Final   Candida krusei NOT DETECTED NOT DETECTED Final   Candida parapsilosis NOT DETECTED NOT DETECTED Final   Candida tropicalis NOT DETECTED NOT DETECTED Final    Comment: Performed at [facility], [place] [address] [place], [place] [address]  Culture, blood (Routine X 2) w Reflex to ID Panel     Status: None (Preliminary result)   Collection Time: 02/28/19  8:50 AM  Result Value Ref Range Status   Specimen Description BLOOD RIGHT HAND  Final   Special Requests   Final    BOTTLES DRAWN AEROBIC ONLY Blood Culture results may not be optimal due to an inadequate volume of blood received in culture bottles   Culture   Final    NO GROWTH 4 DAYS Performed at [facility], [place] [address], [place], [place] [address]    Report Status PENDING  Incomplete  Culture, blood (Routine X 2) w Reflex to ID Panel     Status: None (Preliminary result)   Collection Time: 02/28/19  8:55 AM  Result Value Ref Range Status   Specimen Description BLOOD RIGHT HAND  Final   Special Requests   Final    BOTTLES DRAWN AEROBIC ONLY Blood Culture results may not be optimal due to an inadequate volume of blood received in culture bottles   Culture   Final    NO GROWTH 4 DAYS Performed at [facility], [place] [address] [place], [place] [address]    Report Status PENDING  Incomplete     Radiology Studies: No results found.  Maicie Vanderloop [facility] Pager [phone]  If 7PM-7AM, please contact night-coverage [email or web address] Password [facility] 03/04/2019, 12:43 PM

## 2019-03-04 NOTE — Progress Notes (Signed)
Physical Therapy Treatment Patient Details Name: Michael [name] MRN: [id number] DOB: 03-May-1961 Today's Date: 03/04/2019    History of Present Illness 58 y.o. 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  Work up includes resolved encephalopathy suspected due to MSSA bacteremia, wrist fx/?infection, prosthetic valve endocarditis.    PT Comments    Pt participating minimally.  Needs lots of encouragement.  Emphasizing warm up exercise, transition to EOB, sit to stand, transfers.  Pt deferred gait.    Follow Up Recommendations  SNF;Supervision/Assistance - 24 hour;Other (comment)     Equipment Recommendations       Recommendations for Other Services       Precautions / Restrictions Precautions Precautions: Fall Precaution Comments: L forearm fx Required Braces or Orthoses: (requested RN get order for sling) Restrictions Weight Bearing Restrictions: No Other Position/Activity Restrictions: adhered to NWB on L UE, no specific order    Mobility  Bed Mobility Overal bed mobility: Needs Assistance Bed Mobility: Rolling Rolling: Max assist   Supine to sit: Mod assist     General bed mobility comments: L UE cradled and not allow to assist in mobility.  pt's LE's assist to EOB and pt assisted up and forward via R UE.  Cues for getting pt to participate.  Transfers Overall transfer level: Needs assistance Equipment used: None Transfers: Sit to/from Stand Sit to Stand: Mod assist Stand pivot transfers: Mod assist       General transfer comment: face to face assist to standing and pivot.  Pt giving minimal assist, but doesn't buckle  Ambulation/Gait             General Gait Details: pt deferred  walking.   Stairs             Wheelchair Mobility    Modified Rankin (Stroke Patients Only)       Balance Overall balance assessment: Needs assistance   Sitting balance-Leahy Scale: Poor Sitting balance - Comments: tendency for posterior lean even with 1 UE assist   Standing balance support: Single extremity supported Standing balance-Leahy Scale: Poor                              Cognition Arousal/Alertness: Awake/alert(but keeps eyes closed) Behavior During Therapy: Flat affect Overall Cognitive Status: No family/caregiver present to determine baseline cognitive functioning                                 General Comments: able to make needs known and follow simple commands      Exercises Other Exercises Other Exercises: general warm up hip/knee flexion/ext bil x10 reps    General Comments        Pertinent Vitals/Pain Pain Assessment: Faces Faces Pain Scale: Hurts whole lot Pain Location: L arm Pain Descriptors / Indicators: Discomfort;Guarding;Moaning Pain Intervention(s): Monitored during session;Repositioned    Home Living Family/patient expects to be discharged to:: Skilled nursing facility     Type of Home: House Home Access: Stairs to enter Entrance Stairs-Rails: Can reach both Home Layout: One level Home Equipment: Cane - single point;Shower seat;Other (comment);Grab bars - tub/shower;Hand held shower head      Prior Function Level of Independence: Independent  with assistive device(s)      Comments: lives with roommate and her kids; only uses [facility] after dialysis   PT Goals (current goals can now be found in the care plan section) Acute Rehab PT Goals Patient Stated Goal: agreeable to assist for pericare PT Goal Formulation: Patient unable to participate in goal setting Time For Goal Achievement: 03/17/19 Potential to Achieve Goals: Good Progress towards PT goals: Progressing toward goals(minimal)     Frequency    Min 3X/week      PT Plan Current plan remains appropriate    Co-evaluation              AM-PAC PT "6 Clicks" Mobility   Outcome Measure  Help needed turning from your back to your side while in a flat bed without using bedrails?: A Lot Help needed moving from lying on your back to sitting on the side of a flat bed without using bedrails?: A Lot Help needed moving to and from a bed to a chair (including a wheelchair)?: Total Help needed standing up from a chair using your arms (e.g., wheelchair or bedside chair)?: A Lot Help needed to walk in hospital room?: Total Help needed climbing 3-5 steps with a railing? : Total 6 Click Score: 9    End of Session   Activity Tolerance: Patient tolerated treatment well;Patient limited by pain Patient left: in chair;with call bell/phone within reach;with chair alarm set Nurse Communication: Mobility status PT Visit Diagnosis: Other abnormalities of gait and mobility (R26.89);Muscle weakness (generalized) (M62.81);Pain Pain - Right/Left: Left Pain - part of body: Arm     Time: 4076-8088 PT Time Calculation (min) (ACUTE ONLY): 24 min  Charges:  $Therapeutic Activity: 23-37 mins                     03/04/2019  Donnella Sham, PT Acute Rehabilitation Services [phone]  (pager) [phone]  (office)   Tessie Fass Ether Goebel 03/04/2019, 1:38 PM

## 2019-03-04 NOTE — NC FL2 (Signed)
[place] MEDICAID FL2 LEVEL OF CARE SCREENING TOOL     IDENTIFICATION  Patient Name: Michael [name] Birthdate: [birth year] Sex: male Admission Date (Current Location): 02/26/2019  [facility] and [place] Number:  Herbalist and Address:  [facility] [place] [place] [address], [place],  [address]      Provider Number: [id number]  Attending Physician Name and Address:  Mercy Riding, MD  Relative Name and Phone Number:  [name]  [phone]    Current Level of Care: Hospital Recommended Level of Care: [place] Prior Approval Number:    Date Approved/Denied:   PASRR Number: [id number]  Discharge Plan: SNF    Current Diagnoses: Patient Active Problem List   Diagnosis Date Noted  . Prosthetic valve endocarditis ([place])   . Bacteremia   . Fracture   . Hypoglycemia   . MSSA bacteremia   . Metabolic encephalopathy 44/11/270  . Uremia   . Fall 02/03/2019  . Wrist fracture, closed, left, with routine healing, subsequent encounter 02/03/2019  . Hypotension 02/03/2019  . Encephalopathy 08/06/2018  . Pleural effusion   . ESRD (end stage renal disease) ([place]) 12/01/2017  . CAD (coronary artery disease) 11/24/2017  . Hyperkalemia 11/24/2017  . Fluid overload 11/24/2017  . Elevated troponin 11/24/2017  . Dyspnea 09/18/2017  . Chronic renal failure   . Advance care planning   . Pulmonary hypertension, unspecified ([place])   . Chest pain   . Biventricular heart failure ([place]) 08/10/2017  . Syncope and collapse 08/10/2017  . Cardiac arrest ([place])   . Benign neoplasm of ascending colon   . Rectal bleeding   . ESRD (end stage renal disease) on dialysis ([place])   . Goals of care, counseling/discussion   . Palliative care by specialist   . Acute kidney injury ([place]) 06/25/2017  . Anasarca 05/30/2017  . HLD (hyperlipidemia) 10/05/2016  . Oral bleeding 10/05/2016  . Bilateral knee pain 10/05/2016  . Petechial rash 10/05/2016   . Laceration of tongue   . Insomnia 06/10/2016  . Supratherapeutic INR 06/01/2016  . Acute hepatic encephalopathy 03/15/2016  . Liver cirrhosis ([place]) 03/15/2016  . Volume overload 03/15/2016  . Acute congestive heart failure ([place])   . Insulin dependent diabetes mellitus ([place])   . Acute renal failure (ARF) ([place]) 03/14/2016  . GERD (gastroesophageal reflux disease) 03/04/2016  . Aortic regurgitation 02/27/2016  . Viral gastroenteritis 02/27/2016  . A-fib ([place]) 02/26/2016  . Abdominal pain 02/25/2016  . Diabetes mellitus with complication ([place]) 53/66/4403  . Permanent atrial fibrillation 02/25/2016  . Congestive heart failure (CHF) ([place]) 02/25/2016  . Anemia, chronic disease 02/25/2016  . Atrial fibrillation with RVR ([place]) 02/19/2016  . Pain in joint, ankle and foot 01/10/2016  . Asthma   . Acute hypoxemic respiratory failure ([place]) 12/29/2015  . Acute respiratory failure ([place]) 12/29/2015  . Chronic atrial fibrillation 12/24/2015  . Dilated cardiomyopathy ([place]) 12/24/2015  . Polypharmacy 11/23/2015  . Gout 10/23/2015  . Encounter for therapeutic drug monitoring 10/19/2015  . Anemia - mild exacerbation 10/15/2015  . Tricuspid valve disease 10/09/2015  . Mitral valve disease   . Calf pain 09/10/2015  . Type II diabetes mellitus with renal manifestations ([place]) 09/10/2015  . Essential hypertension 09/10/2015    Orientation RESPIRATION BLADDER Height & Weight     Self, Time, Situation, Place  Normal Continent Weight: 146 lb 2.6 oz (66.3 kg) Height:  5\' 2"  (157.5 cm)  BEHAVIORAL SYMPTOMS/MOOD NEUROLOGICAL BOWEL NUTRITION STATUS      Incontinent  Diet(please see discharge summary )  AMBULATORY STATUS COMMUNICATION OF NEEDS Skin     Verbally Normal                       Personal Care Assistance Level of Assistance  Bathing, Feeding, Dressing Bathing Assistance: Maximum assistance Feeding assistance: Maximum assistance Dressing Assistance: Maximum assistance      Functional Limitations Info  Sight, Hearing, Speech Sight Info: Adequate Hearing Info: Adequate Speech Info: Adequate    SPECIAL CARE FACTORS FREQUENCY  PT (By licensed PT), OT (By licensed OT)     PT Frequency: 5x per week OT Frequency: 5x per week             Contractures Contractures Info: Not present    Additional Factors Info  Code Status, Allergies Code Status Info: FULL Allergies Info: Triamcinolone           Current Medications (03/04/2019):  This is the current hospital active medication list Current Facility-Administered Medications  Medication Dose Route Frequency Provider Last Rate Last Dose  . acetaminophen (TYLENOL) tablet 650 mg  650 mg Oral Q8H PRN Donne Hazel, MD   650 mg at 03/02/19 2146  . amiodarone (PACERONE) tablet 400 mg  400 mg Oral BID Cherlynn Kaiser A, MD   400 mg at 03/04/19 0829  . calcium acetate (PHOSLO) capsule 667 mg  667 mg Oral TID WC Patrecia Pour, Christean Grief, MD   667 mg at 03/04/19 1128  . ceFAZolin (ANCEF) IVPB 2g/100 mL premix  2 g Intravenous Q T,Th,Sa-HD Karren Cobble, RPH 200 mL/hr at 03/03/19 1408 2 g at 03/03/19 1408  . [START ON 03/07/2019] ceFAZolin (ANCEF) IVPB 2g/100 mL premix  2 g Intravenous Q M,W,F-HD Karren Cobble, RPH      . Chlorhexidine Gluconate Cloth 2 % PADS 6 each  6 each Topical Q0600 Patrecia Pour, Christean Grief, MD   6 each at 03/02/19 [phone]  . doxercalciferol (HECTOROL) injection 1 mcg  1 mcg Intravenous Q M,W,F-HD Patrecia Pour, Edwin, MD      . fluticasone [facility]) 50 MCG/ACT nasal spray 2 spray  2 spray Each Nare Daily Patrecia Pour, Christean Grief, MD   2 spray at 03/04/19 0830  . folic acid (FOLVITE) tablet 1 mg  1 mg Oral Daily Patrecia Pour, Christean Grief, MD   1 mg at 03/04/19 0830  . hydrALAZINE (APRESOLINE) injection 5 mg  5 mg Intravenous Q6H PRN Patrecia Pour, Christean Grief, MD      . lidocaine (XYLOCAINE) 2 % viscous mouth solution 15 mL  15 mL Mouth/Throat Q6H PRN Patrecia Pour, Christean Grief, MD      . loratadine (CLARITIN)  tablet 10 mg  10 mg Oral Daily Patrecia Pour, Christean Grief, MD   10 mg at 03/04/19 0829  . MEDLINE mouth rinse  15 mL Mouth Rinse BID Wendee Beavers T, MD   15 mL at 03/04/19 0831  . midodrine (PROAMATINE) tablet 10 mg  10 mg Oral Q dialysis Madelon Lips, MD   10 mg at 03/03/19 0912  . midodrine (PROAMATINE) tablet 10 mg  10 mg Oral TID WC Donne Hazel, MD   10 mg at 03/04/19 1129  . ondansetron (ZOFRAN) tablet 4 mg  4 mg Oral Q6H PRN Patrecia Pour, Christean Grief, MD       Or  . ondansetron [facility]) injection 4 mg  4 mg Intravenous Q6H PRN Patrecia Pour, Christean Grief, MD   4 mg at 03/03/19 0615  . oxyCODONE (Oxy IR/ROXICODONE) immediate release tablet  5 mg  5 mg Oral Q4H PRN Patrecia Pour, Christean Grief, MD   5 mg at 03/04/19 1127  . rifampin (RIFADIN) capsule 300 mg  300 mg Oral Q8H Tommy Medal, Lavell Islam, MD   300 mg at 03/04/19 0530  . sodium chloride flush (NS) 0.9 % injection 3 mL  3 mL Intravenous Q12H Patrecia Pour, Christean Grief, MD   3 mL at 03/04/19 0830  . sodium chloride flush (NS) 0.9 % injection 3 mL  3 mL Intravenous PRN Doreatha Lew, MD      . Warfarin - Pharmacist Dosing Inpatient   Does not apply q1800 Reginia Naas, [facility]   Stopped at 03/03/19 1800  . white petrolatum (VASELINE) gel   Topical PRN Mercy Riding, MD         Discharge Medications: Please see discharge summary for a list of discharge medications.  Relevant Imaging Results:  Relevant Lab Results:   Additional Information SSN [id number]  Vinie Sill, LCSWA

## 2019-03-04 NOTE — TOC Initial Note (Signed)
Transition of Care [facility]) - Initial/Assessment Note    Patient Details  Name: Michael [name] MRN: [id number] Date of Birth: [birth year]  [facility]) CM/SW Contact:    Vinie Sill, [place] Phone Number: 03/04/2019, 12:05 PM  Clinical Narrative:                 CSW visited with the patient. He was sitting up in chair and drinking water. Patient spoke English but did not respond or follow some of the CSW assessment questions. CSW talked with the patient's daughter confirmed patient speaks and understands [place]. Patient's daughter informed the CSW patient rents a room form a family but and she is looking to find him another place to stay. Patient's daughter states she is his only family locally , the rest of the family resides in [place]. He receives dialysis M,W,F,Sat at [facility]. He uses [name] for transportation. Patient's daughter states she has applied for Medicaid for the patient.   Patient's family recognizes need for rehab before returning home and is agreeable to a SNF placement. Family unfamiliar with any SNFs . Family gave CSW permission to fax SNF referrals. CSW send bed offers once available.    Patient's family  expressed understanding of CSW role and discharge process as well as medical condition. No questions/concerns about plan or treatment at this time.  Expected Discharge Plan: Skilled Nursing Facility Barriers to Discharge: SNF Pending bed offer, Continued Medical Work up   Patient Goals and CMS Choice        Expected Discharge Plan and Services Expected Discharge Plan: [facility] In-house Referral: Clinical Social Work                                Prior Living Arrangements/Services   Lives with:: Other (Comment)(rent his room) Patient language and need for interpreter reviewed:: No(patient's daughter states Pateint speaks [place]) Do you feel safe going back to the place where you live?: No(daughter  wants the patient to go to SNF for ST Rehab)      Need for Family Participation in Patient Care: Yes (Comment) Care giver support system in place?: Yes (comment)      Activities of Daily Living      Permission Sought/Granted Permission sought to share information with : Case Manager, Customer service manager, Family Supports Permission granted to share information with : Yes, Verbal Permission Granted  Share Information with NAME: Sivansay,Pattie  Permission granted to share info w AGENCY: SNFs  Permission granted to share info w Relationship: daughter  Permission granted to share info w Contact Information: [phone]  Emotional Assessment Appearance:: Appears stated age Attitude/Demeanor/Rapport: (engaged a little ) Affect (typically observed): Other (comment)(pateint was eating ) Orientation: : Oriented to Self, Oriented to Place, Oriented to  Time, Oriented to Situation Alcohol / Substance Use: Not Applicable Psych Involvement: No (comment)  Admission diagnosis:  Fracture [T14.8XXA] Oral bleeding [K13.79] Uremia [N19] Hypoglycemia [E16.2] Encephalopathy [G93.40] Supratherapeutic INR [R79.1] Wrist fracture, closed, left, with routine healing, subsequent encounter [T41.962I] Metabolic encephalopathy [W97.98] Patient Active Problem List   Diagnosis Date Noted  . Prosthetic valve endocarditis ([place])   . Bacteremia   . Fracture   . Hypoglycemia   . MSSA bacteremia   . Metabolic encephalopathy 92/09/9416  . Uremia   . Fall 02/03/2019  . Wrist fracture, closed, left, with routine healing, subsequent encounter 02/03/2019  . Hypotension 02/03/2019  . Encephalopathy 08/06/2018  .  Pleural effusion   . ESRD (end stage renal disease) ([place]) 12/01/2017  . CAD (coronary artery disease) 11/24/2017  . Hyperkalemia 11/24/2017  . Fluid overload 11/24/2017  . Elevated troponin 11/24/2017  . Dyspnea 09/18/2017  . Chronic renal failure   . Advance care planning   .  Pulmonary hypertension, unspecified ([place])   . Chest pain   . Biventricular heart failure ([place]) 08/10/2017  . Syncope and collapse 08/10/2017  . Cardiac arrest ([place])   . Benign neoplasm of ascending colon   . Rectal bleeding   . ESRD (end stage renal disease) on dialysis ([place])   . Goals of care, counseling/discussion   . Palliative care by specialist   . Acute kidney injury ([place]) 06/25/2017  . Anasarca 05/30/2017  . HLD (hyperlipidemia) 10/05/2016  . Oral bleeding 10/05/2016  . Bilateral knee pain 10/05/2016  . Petechial rash 10/05/2016  . Laceration of tongue   . Insomnia 06/10/2016  . Supratherapeutic INR 06/01/2016  . Acute hepatic encephalopathy 03/15/2016  . Liver cirrhosis ([place]) 03/15/2016  . Volume overload 03/15/2016  . Acute congestive heart failure ([place])   . Insulin dependent diabetes mellitus ([place])   . Acute renal failure (ARF) ([place]) 03/14/2016  . GERD (gastroesophageal reflux disease) 03/04/2016  . Aortic regurgitation 02/27/2016  . Viral gastroenteritis 02/27/2016  . A-fib ([place]) 02/26/2016  . Abdominal pain 02/25/2016  . Diabetes mellitus with complication ([place]) 12/81/1886  . Permanent atrial fibrillation 02/25/2016  . Congestive heart failure (CHF) ([place]) 02/25/2016  . Anemia, chronic disease 02/25/2016  . Atrial fibrillation with RVR ([place]) 02/19/2016  . Pain in joint, ankle and foot 01/10/2016  . Asthma   . Acute hypoxemic respiratory failure ([place]) 12/29/2015  . Acute respiratory failure ([place]) 12/29/2015  . Chronic atrial fibrillation 12/24/2015  . Dilated cardiomyopathy ([place]) 12/24/2015  . Polypharmacy 11/23/2015  . Gout 10/23/2015  . Encounter for therapeutic drug monitoring 10/19/2015  . Anemia - mild exacerbation 10/15/2015  . Tricuspid valve disease 10/09/2015  . Mitral valve disease   . Calf pain 09/10/2015  . Type II diabetes mellitus with renal manifestations ([place]) 09/10/2015  . Essential hypertension 09/10/2015   PCP:  Charlott Rakes,  MD Pharmacy:   [id number] [place], [place] [address] Phone: [phone] Fax: [phone]     Social Determinants of Health (SDOH) Interventions    Readmission Risk Interventions No flowsheet data found.

## 2019-03-04 NOTE — Evaluation (Signed)
Occupational Therapy Evaluation Patient Details Name: Michael [name] MRN: [id number] DOB: 1961/03/13 Today's Date: 03/04/2019    History of Present Illness 58 y.o. 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  Work up includes resolved encephalopathy suspected due to MSSA bacteremia, wrist fx/?infection, prosthetic valve endocarditis.   Clinical Impression   Pt communicating basic needs, but not able to offer information about PLOF. According to chart, pt is independent with AD at his baseline. Pt presents with L UE pain, generalized weakness and impaired cognition. He is able to bring a cup with straw to his mouth to drink, but is otherwise dependent in ADL. Pt aware he had a BM and requested clean up, but had not initiated calling nursing staff. Pt will need SNF for further rehab. RN to ask MD for sling. Will follow acutely.    Follow Up Recommendations  SNF;Supervision/Assistance - 24 hour    Equipment Recommendations  Other (comment)(defer to next venue)    Recommendations for Other Services       Precautions / Restrictions Precautions Precautions: Fall Precaution Comments: L forearm fx Required Braces or Orthoses: (requested RN get order for sling) Restrictions Weight Bearing Restrictions: No Other Position/Activity Restrictions: adhered to NWB on L UE, no specific order      Mobility Bed Mobility Overal bed mobility: Needs Assistance Bed Mobility: Rolling Rolling: Max assist         General bed mobility comments: max assist toward R, mod assist to L for pericare and change of linens, supported L UE with pillow   Transfers                      Balance                                            ADL either performed or assessed with clinical judgement   ADL                                         General ADL Comments: total assist with exception of drinking with straw     Vision   Vision Assessment?: No apparent visual deficits     Perception     Praxis      Pertinent Vitals/Pain Pain Assessment: Faces Faces Pain Scale: Hurts whole lot Pain Location: L arm Pain Descriptors / Indicators: Discomfort;Guarding;Moaning Pain Intervention(s): Monitored during session;Repositioned;Premedicated before session     Hand Dominance Right   Extremity/Trunk Assessment Upper Extremity Assessment Upper Extremity Assessment: LUE deficits/detail;RUE deficits/detail RUE Deficits / Details: generalized weakness LUE Deficits / Details: sugar tong splint LUE Coordination: decreased fine motor;decreased gross motor   Lower Extremity Assessment Lower Extremity Assessment: Defer to PT evaluation       Communication Communication Communication: Prefers language other than English([place], but speaks [place])   Cognition Arousal/Alertness: Awake/alert(but keeps eyes closed) Behavior During Therapy: Flat affect Overall Cognitive Status: No family/caregiver present to determine baseline cognitive functioning  General Comments: able to make needs known and follow simple commands   General Comments       Exercises     Shoulder Instructions      Home Living Family/patient expects to be discharged to:: Skilled nursing facility     Type of Home: House Home Access: Stairs to enter Entrance Stairs-Number of Steps: 3 Entrance Stairs-Rails: Can reach both Home Layout: One level     Bathroom Shower/Tub: Walk-in Hydrologist: [place] - single point;Shower seat;Other (comment);Grab bars - tub/shower;Hand held shower head          Prior  Functioning/Environment Level of Independence: Independent with assistive device(s)        Comments: lives with roommate and her kids; only uses [facility] after dialysis        OT Problem List: Decreased strength;Decreased range of motion;Decreased activity tolerance;Impaired balance (sitting and/or standing);Impaired UE functional use;Decreased knowledge of use of DME or AE;Increased edema;Decreased coordination      OT Treatment/Interventions: Self-care/ADL training;Balance training;Therapeutic exercise;DME and/or AE instruction;Patient/family education    OT Goals(Current goals can be found in the care plan section) Acute Rehab OT Goals Patient Stated Goal: agreeable to assist for pericare OT Goal Formulation: Patient unable to participate in goal setting Time For Goal Achievement: 03/18/19 Potential to Achieve Goals: Fair ADL Goals Pt Will Perform Eating: with set-up;sitting;bed level Pt Will Perform Grooming: with min assist;sitting;bed level Pt Will Perform Upper Body Dressing: with min assist;sitting Pt Will Transfer to Toilet: with min assist;stand pivot transfer;bedside commode Additional ADL Goal #1: Pt will perform bed mobility with min assist for ADL.  OT Frequency: Min 2X/week   Barriers to D/C: Decreased caregiver support          Co-evaluation              AM-PAC OT "6 Clicks" Daily Activity     Outcome Measure Help from another person eating meals?: A Lot Help from another person taking care of personal grooming?: Total Help from another person toileting, which includes using toliet, bedpan, or urinal?: Total Help from another person bathing (including washing, rinsing, drying)?: Total Help from another person to put on and taking off regular upper body clothing?: Total Help from another person to put on and taking off regular lower body clothing?: Total 6 Click Score: 7   End of Session Nurse Communication: Other (comment)(recommended sling)  Activity  Tolerance: Patient limited by pain Patient left: in bed;with call bell/phone within reach;with bed alarm set  OT Visit Diagnosis: Unsteadiness on feet (R26.81);Other abnormalities of gait and mobility (R26.89);History of falling (Z91.81);Muscle weakness (generalized) (M62.81);Pain;Other symptoms and signs involving cognitive function Pain - Right/Left: Left Pain - part of body: Arm                Time: 1004-1029 OT Time Calculation (min): 25 min Charges:  OT General Charges $OT Visit: 1 Visit OT Evaluation $OT Eval Moderate Complexity: 1 Mod OT Treatments $Self Care/Home Management : 8-22 mins Nestor Lewandowsky, OTR/L Acute Rehabilitation Services Pager: [phone] Office: [phone]  Malka So 03/04/2019, 11:20 AM

## 2019-03-04 NOTE — Progress Notes (Signed)
CRITICAL VALUE ALERT  Critical Value: INR- 5.3  Date & Time Notied: 03/04/19,0430 AM  Provider Notified: Raliegh Ip Schorr  Orders Received/Actions taken: None

## 2019-03-05 LAB — RENAL FUNCTION PANEL
Albumin: 2.1 g/dL — ABNORMAL LOW (ref 3.5–5.0)
Albumin: 2.1 g/dL — ABNORMAL LOW (ref 3.5–5.0)
Anion gap: 10 (ref 5–15)
Anion gap: 14 (ref 5–15)
BUN: 16 mg/dL (ref 6–20)
BUN: 27 mg/dL — ABNORMAL HIGH (ref 6–20)
CO2: 26 mmol/L (ref 22–32)
CO2: 25 mmol/L (ref 22–32)
Calcium: 8.2 mg/dL — ABNORMAL LOW (ref 8.9–10.3)
Calcium: 8.9 mg/dL (ref 8.9–10.3)
Chloride: 91 mmol/L — ABNORMAL LOW (ref 98–111)
Chloride: 89 mmol/L — ABNORMAL LOW (ref 98–111)
Creatinine, Ser: 3.97 mg/dL — ABNORMAL HIGH (ref 0.61–1.24)
Creatinine, Ser: 5.31 mg/dL — ABNORMAL HIGH (ref 0.61–1.24)
GFR calc Af Amer: 18 mL/min — ABNORMAL LOW (ref >60)
GFR calc Af Amer: 13 mL/min — ABNORMAL LOW (ref >60)
GFR calc non Af Amer: 15 mL/min — ABNORMAL LOW (ref >60)
GFR calc non Af Amer: 11 mL/min — ABNORMAL LOW (ref >60)
Glucose, Bld: 100 mg/dL — ABNORMAL HIGH (ref 70–99)
Glucose, Bld: 82 mg/dL (ref 70–99)
Phosphorus: 1.6 mg/dL — ABNORMAL LOW (ref 2.5–4.6)
Phosphorus: 2.1 mg/dL — ABNORMAL LOW (ref 2.5–4.6)
Potassium: 3.7 mmol/L (ref 3.5–5.1)
Potassium: 4.4 mmol/L (ref 3.5–5.1)
Sodium: 127 mmol/L — ABNORMAL LOW (ref 135–145)
Sodium: 128 mmol/L — ABNORMAL LOW (ref 135–145)

## 2019-03-05 LAB — CBC
HCT: 23.5 % — ABNORMAL LOW (ref 39.0–52.0)
Hemoglobin: 8.1 g/dL — ABNORMAL LOW (ref 13.0–17.0)
MCH: 26.2 pg (ref 26.0–34.0)
MCHC: 34.5 g/dL (ref 30.0–36.0)
MCV: 76.1 fL — ABNORMAL LOW (ref 80.0–100.0)
Platelets: 174 10*3/uL (ref 150–400)
RBC: 3.09 MIL/uL — ABNORMAL LOW (ref 4.22–5.81)
RDW: 15.9 % — ABNORMAL HIGH (ref 11.5–15.5)
WBC: 13.2 10*3/uL — ABNORMAL HIGH (ref 4.0–10.5)
nRBC: 0.2 % (ref 0.0–0.2)

## 2019-03-05 LAB — CULTURE, BLOOD (ROUTINE X 2)
Culture: NO GROWTH
Culture: NO GROWTH

## 2019-03-05 LAB — PROTIME-INR
INR: 6.9 (ref 0.8–1.2)
INR: 6.3 (ref 0.8–1.2)
Prothrombin Time: 58.6 seconds — ABNORMAL HIGH (ref 11.4–15.2)
Prothrombin Time: 54.4 seconds — ABNORMAL HIGH (ref 11.4–15.2)

## 2019-03-05 LAB — BILIRUBIN, TOTAL: Total Bilirubin: 13 mg/dL — ABNORMAL HIGH (ref 0.3–1.2)

## 2019-03-05 LAB — GENTAMICIN LEVEL, RANDOM: Gentamicin Rm: 4.2 ug/mL

## 2019-03-05 LAB — MAGNESIUM: Magnesium: 2.3 mg/dL (ref 1.7–2.4)

## 2019-03-05 MED ORDER — GENTAMICIN IN SALINE 1.2-0.9 MG/ML-% IV SOLN
60.0000 mg | INTRAVENOUS | Status: DC
  Administered 2019-03-05: 15:00:00 60 mg via INTRAVENOUS
  Filled 2019-03-05: qty 50

## 2019-03-05 MED ORDER — DARBEPOETIN ALFA 60 MCG/0.3ML IJ SOSY: 60.0000 ug | PREFILLED_SYRINGE | INTRAMUSCULAR | Status: DC

## 2019-03-05 MED ORDER — GENTAMICIN IN SALINE 1.2-0.9 MG/ML-% IV SOLN: 60.0000 mg | INTRAVENOUS | Status: DC

## 2019-03-05 MED ORDER — CEFAZOLIN SODIUM-DEXTROSE 2-4 GM/100ML-% IV SOLN
2.0000 g | INTRAVENOUS | Status: DC
  Administered 2019-03-05: 15:00:00 2 g via INTRAVENOUS
  Filled 2019-03-05: qty 100

## 2019-03-05 NOTE — Progress Notes (Signed)
Pt received back from HD. Pt not given hectorol injection in dialysis. HD unit RN called and notified. RN said she would come to this unit and give. Amanda Cockayne, RN

## 2019-03-05 NOTE — Progress Notes (Signed)
Pharmacy Antibiotic Note  Michael [name] is a 58 y.o. 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 Pharmacy has been consulted for gentamicin dosing. Patient is ESRD with HD MWFSa.  Patient had an HD session this morning. He tolerated approximately 4 hours of hemodialysis with a BFR of 400 ml/hr. A pre-HD gent level was drawn today and resulted at 4.2. Predicted post-dialysis level ~ 0.8.   Plan: Gentamicin 60 mg q HD for now. Can likely increase to 1.5 mg/kg if continues tolerating full dialysis session at BFR 350-400  Height: 5\' 2"  (157.5 cm) Weight: 145 lb 4.5 oz (65.9 kg) IBW/kg (Calculated) : 54.6  Temp (24hrs), Avg:98.1 F (36.7 C), Min:97.6 F (36.4 C), Max:99 F (37.2 C)  Recent Labs  Lab 02/27/19 2040 02/28/19 0027 03/01/19 0413 03/03/19 0338 03/03/19 0852 03/04/19 0308 03/04/19 0923 03/04/19 1128 03/05/19 0250 03/05/19 0917  WBC  --  15.7* 12.9* 14.2*  --  12.1*  --   --  13.2*  --   CREATININE  --  6.47* 8.02* 6.30*  --  3.97*  --   --  5.31*  --   LATICACIDVEN 3.0* 2.7*  --   --   --   --  2.2* 2.2*  --   --   GENTRANDOM  --   --   --   --  4.0  --   --   --   --  4.2    Estimated Creatinine Clearance: 12.5 mL/min (A) (by C-G formula based on SCr of 5.31 mg/dL (H)).    Allergies  Allergen Reactions  . Triamcinolone Other (See Comments)    Felt like skin was burning    Antimicrobials this admission: 4/13 Ancef>> 4/14 Rifampin >> 4/14 Gent >>  Cultures: 4/13 BCx x 2 - neg 4/11 BCx x 2 - MSSA  Thank you for allowing pharmacy to be a part of this patient's care.  Marguerite Olea, [facility] Clinical Pharmacist Phone [phone]  03/05/2019 1:43 PM

## 2019-03-05 NOTE — Progress Notes (Signed)
CRITICAL VALUE ALERT  Critical Value: INR - 6.9  Date & Time Notied: 03/05/19, 0450 AM  Provider Notified: X. [place]ount  Orders Received/Actions taken: None

## 2019-03-05 NOTE — Progress Notes (Signed)
ANTICOAGULATION CONSULT NOTE - Follow Up Consult  Pharmacy Consult for Coumadin Indication: PAF/MVR  Allergies  Allergen Reactions  . Triamcinolone Other (See Comments)    Felt like skin was burning    Patient Measurements: Height: 5\' 2"  (157.5 cm) Weight: 145 lb 4.5 oz (65.9 kg) IBW/kg (Calculated) : 54.6  Vital Signs: Temp: 97.7 F (36.5 C) (04/18 1329) Temp Source: Oral (04/18 1329) BP: 78/59 (04/18 1329) Pulse Rate: 99 (04/18 1329)  Labs: Recent Labs    03/03/19 0338 03/04/19 0308 03/05/19 0250  HGB 7.9* 7.8* 8.1*  HCT 22.2* 22.5* 23.5*  PLT 156 155 174  LABPROT 29.5* 47.4* 58.6*  INR 2.9* 5.3* 6.9*  CREATININE 6.30* 3.97* 5.31*    Estimated Creatinine Clearance: 12.5 mL/min (A) (by C-G formula based on SCr of 5.31 mg/dL (H)).  Assessment: 58 yo male with: h/o PAF/MVR on Coumadin 2.5mg  daily PTA, s/p MAZE and MVR porcine. Pharmacy dosing coumadin. He is on amiodarone and rifampin which will greatly influence has coumadin requirements -INR 1.9>> 2.0>> 5.3 > 6.9 (likely d/t amio; started 4/11)   Goal of Therapy:  INR 2-3 Monitor platelets by anticoagulation protocol: Yes   Plan:  -Hold Coumadin tonight -Monitor daily INR  Marguerite Olea, [facility] Clinical Pharmacist Phone [phone]  03/05/2019 1:38 PM

## 2019-03-05 NOTE — Progress Notes (Signed)
CRITICAL VALUE ALERT  Critical Value:  INR 6.3  Date & Time Notied:  03/05/2019    1450  Provider Notified: Wendee Beavers, MD  Orders Received/Actions taken: awaiting orders.   Amanda Cockayne, RN

## 2019-03-05 NOTE — Progress Notes (Signed)
[facility] Progress Note   Assessment/ Plan:     [facility]  4h  3K/2.25 bath Hep none  63kg  Hep none  LUA AVF 300/800 - leaving below the past two Rx's   - hectorol 1  - mircera 30 q 2 weeks - last 4/6 - Recent labs: h hgb 11.1 4/1 INR 5.98 4/6 41% sat Ca 8.8 P 1.8 ipTH 175 alb 3.6   Assessment/Plan: 1. MSSA bacteremia - possible asp PNA. Wrist MRI neg. Possible endocarditis given prosthetic valve. Per ID is getting mult abx (ancef/ gent / rifampin). He is still very sick but making daily progress. 2. H/o prosthetic mitral valve 3. AMS - due to infection, back to baseline 4. Volume - down to dry wt after HD today 5. Hypotension - baseline SBP 80's- 90's, on midodrine 10 tid chronically 6. ESRD on HD : HD today then resume MWFSat. 7. Atrial fib: on coumadin and po amio.  8.  Anemia ckd - hgb high 7's - 8's. Last tsat ok. Due for esa Monday, ordered.   9.  Metabolic bone disease - cont hectorol 1 ug. P low, no binders for now 10.  Nutrition - renal diet  11. DM - per primary 12. Chronic rash / leukocytoclastic vasculitis - stable - has had this for some time now - no heparin HD 13. Substance abuse - hx of meth use - drug screen pending 14. Left wrist fx - last month  15. H/o Cirrhosis 16. DNR - pt request, confirmed w/ daughter on phone w/ patient 4/17  Subjective:    C/O pain all over, won't verbalize much, nods/ shakes head yes vs no.     Objective:   BP (!) 78/59 (BP Location: Right Arm)   Pulse 99   Temp 97.7 F (36.5 C) (Oral)   Resp 12   Ht 5\' 2"  (1.575 m)   Wt 65.9 kg   SpO2 90%   BMI 26.57 kg/m   Physical Exam: Gen: lying in bed, awake, on HD, chron ill and very weak, not in distress CVS: irregular Resp: clear anteriorly and lat Abd: slightly distended Ext: no LE edema Skin: diffuse chronic  petechial rash LE's and torso LUA AVF+bruit  Labs: BMET Recent Labs  Lab 02/26/19 2116 02/27/19 1119 02/28/19 0027 03/01/19 0413  03/03/19 0338 03/04/19 0308 03/05/19 0250  NA 131* 134* 136 135 130* 127* 128*  K 4.8 3.5 3.8 3.6 4.4 3.7 4.4  CL 92* 96* 95* 96* 93* 91* 89*  CO2 20* 24 24 25 22 26 25   GLUCOSE 70 103* 84 113* 100* 100* 82  BUN 77* 21* 29* 43* 35* 16 27*  CREATININE 12.06* 5.58* 6.47* 8.02* 6.30* 3.97* 5.31*  CALCIUM 9.1 7.9* 8.5* 8.7* 8.7* 8.2* 8.9  PHOS  --   --   --   --  1.7* 1.6* 2.1*   CBC Recent Labs  Lab 03/01/19 0413 03/03/19 0338 03/04/19 0308 03/05/19 0250  WBC 12.9* 14.2* 12.1* 13.2*  HGB 8.5* 7.9* 7.8* 8.1*  HCT 24.4* 22.2* 22.5* 23.5*  MCV 78.7* 75.8* 76.0* 76.1*  PLT 178 156 155 174    @IMGRELPRIORS @ Medications:    . amiodarone  400 mg Oral BID  . calcium acetate  667 mg Oral TID WC  . Chlorhexidine Gluconate Cloth  6 each Topical Q0600  . Chlorhexidine Gluconate Cloth  6 each Topical Q0600  . doxercalciferol  1 mcg Intravenous Q T,Th,Sa-HD  . doxercalciferol  1 mcg Intravenous Q  M,W,F-HD  . fluticasone  2 spray Each Nare Daily  . folic acid  1 mg Oral Daily  . loratadine  10 mg Oral Daily  . mouth rinse  15 mL Mouth Rinse BID  . midodrine  10 mg Oral TID WC  . rifampin  300 mg Oral Q8H  . sodium chloride flush  3 mL Intravenous Q12H  . Warfarin - Pharmacist Dosing Inpatient   Does not apply [phone]

## 2019-03-05 NOTE — Progress Notes (Signed)
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 Apparently patient was yelling at his house and was found very confused and not making sense. He missed dialysis multiple times and has had slow, bleeding for about a week and INR was checked and found to be elevated.   In ED, confused and agitated. Cr 12.17, BUN 71, glucose 67, ALP 146, WBC 13.7, hemoglobin 9.7, INR 6.0, PT 38.9. CT of the head with no acute findings. Rectal temperature 100.2.  Blood cultures obtained.  Started on vancomycin and ceftriaxone for sepsis and admitted.  Patient was found to have MSSA and possible streptococcal bacteremia.  ID consulted.  Antibiotic narrowed to Ancef, rifampin and gentamicin.  Initially concerned about possible left wrist infection to be the source of his bacteremia.  However, MRI did not show evidence of infection but known fractures and TFC tear.  CT chest obtained on 4/15 by ID and showed RUL and RLL pneumonia with right moderate pleural effusion.   Thoracocentesis ordered on 4/17 but INR supratherapeutic.  Subjective: No major events overnight of this morning.  INR up to 6.9.  Blood pressure has been stable.  More awake and interactive today.  No complaint except for left wrist pain.  Asking for apple sauce.  Assessment & Plan: Active Problems:   Oral bleeding   Encephalopathy   Wrist fracture, closed, left, with routine healing, subsequent encounter   Metabolic encephalopathy   Bacteremia   Fracture   Hypoglycemia   MSSA bacteremia   Prosthetic valve endocarditis (HCC)  Acute metabolic encephalopathy: No focal neuro deficit.  CT head without  acute intracranial finding.  Suspect encephalopathy to be due to infectious process. 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  Patient's daughter denies history of cognitive impairment.  He was also uremic on admission that has improved.  Ammonia and thyroid panel within normal range.  Mental status improved.  More interactive.  Oriented x4. -Treated treatable causes -UDS ordered but he is anuric. -Delirium precautions -PT/OT-SNF  MSSA and possible streptococcal bacteremia: Multiple potential sources including HD access, bioprosthetic heart valve and pneumonia. MRI of left wrist without evidence of infection. RUL/RLL pneumonia-noted on CT chest on 4/15. ?Aspiration Moderate right pleural effusion-likely due to pneumonia as above -2D echo without vegetation.  Not a candidate for TEE per cardiology due to hemodynamics.  -ID treating for presumed endocarditis with Ancef, gentamicin and rifampin given prosthetic mitral valve.  -Repeat blood cultures from 02/28/2019- so far -Vancomycin and ceftriaxone 4/12. -Ancef 4/13-- -rifampin 4/14-- -gentamicin/14-- -?Anaerobic coverage -IR thoracocentesis ordered-May have to wait until INR is therapeutic.  Stable left distal radius and ulna fractures/TFC tear: Subacute.  This was noted on MRI of left wrist on 4/14 and x-ray on 02/03/2019 -Supportive care. -Pain management  Chronic atrial fibrillation/WCT:  Heart rate 100-117.  -Cardiology -signed off. -Amiodarone 400 mg twice daily 4/12-4/26, then 200 mg daily per cardiologist recommendation. -Pharmacy managing warfarin.  INR supratherapeutic likely due to concomitant rifampin and amiodarone. 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  Abdominal  pain: Seems to have resolved.  CT abdomen and pelvis without acute finding but cholelithiasis without  cholecystitis, staghorn renal calculus in the L renal pelvis without hydronephrosis and  cirrhosis.  Anemia of chronic disease: anemia panel not reliable in acute setting.  No obvious signs of bleeding.  Stable. -Per nephrology -Continue monitoring  Hypertension: Continues to have soft blood pressures.  Baseline SBP in 80s and 90s. -Continue holding metoprolol -Continue home Midodrine  DM-2 with renal complication: W2X 4.6.  CBG has been fairly controlled. -Discontinue sliding scale and CBG monitoring  History of leukocytoclastic vasculitis/chronic macular rash: areas of hyperpigmented and erythematous macules on lower extremities bilaterally.  Stable. -Outpatient follow-up  Scheduled Meds: . amiodarone  400 mg Oral BID  . calcium acetate  667 mg Oral TID WC  . Chlorhexidine Gluconate Cloth  6 each Topical Q0600  . Chlorhexidine Gluconate Cloth  6 each Topical Q0600  . doxercalciferol  1 mcg Intravenous Q T,Th,Sa-HD  . doxercalciferol  1 mcg Intravenous Q M,W,F-HD  . fluticasone  2 spray Each Nare Daily  . folic acid  1 mg Oral Daily  . loratadine  10 mg Oral Daily  . mouth rinse  15 mL Mouth Rinse BID  . midodrine  10 mg Oral TID WC  . rifampin  300 mg Oral Q8H  . sodium chloride flush  3 mL Intravenous Q12H  . Warfarin - Pharmacist Dosing Inpatient   Does not apply q1800   Continuous Infusions: .  ceFAZolin (ANCEF) IV Stopped (03/03/19 1438)  . [START ON 03/07/2019]  ceFAZolin (ANCEF) IV     PRN Meds:.acetaminophen, hydrALAZINE, lidocaine, midodrine, ondansetron **OR** ondansetron (ZOFRAN) IV, oxyCODONE, sodium chloride flush, white petrolatum  DVT prophylaxis: Warfarin on hold due to supratherapeutic INR. Code Status: Full code Family Communication: None at bedside.   -Called and updated daughter over the phone on 4/15  Disposition Plan: Remains inpatient for treatment of bacteremia and endocarditis with IV antibiotics pending improvement.  Final disposition SNF when  clinically stable.  Consultants:   Infectious disease  Nephrology  Cardiology  IR  Procedures:   None  Antimicrobials:  As above  Objective: Vitals:   03/05/19 1100 03/05/19 1130 03/05/19 1200 03/05/19 1226  BP:  (!) 71/54 (!) 81/53   Pulse: (!) 104 (!) 109 (!) 101 (!) 109  Resp: 13 15 16 15   Temp:      TempSrc:      SpO2:      Weight:      Height:        Intake/Output Summary (Last 24 hours) at 03/05/2019 1243 Last data filed at 03/05/2019 0430 Gross per 24 hour  Intake 698 ml  Output -  Net 698 ml   Filed Weights   03/04/19 0549 03/05/19 0500 03/05/19 0818  Weight: 66.3 kg 65.3 kg 65.9 kg    Examination:  GENERAL: Appears well. No acute distress.  On HD. HEENT: MMM.  Vision and Hearing grossly intact.  NECK: Supple.  No JVD.  LUNGS:  No IWOB.  Fair air movement bilaterally. HEART: Tachycardic.  Heart sounds normal.  2/6 SEM over RUSB. ABD: Bowel sounds present. Soft.  Some tenderness over epigastric area. EXT: Moves extremities.  Left arm wrapped in Ace wrap.  No erythema.  Cap refill is brisk. SKIN: Widespread macular rash over his lower extremities (chronic) NEURO: Awake, alert and oriented appropriately.  No gross deficit.  More interactive today. PSYCH: Calm. Normal affect. Data Reviewed: I have independently reviewed following labs and  imaging studies  CBC: Recent Labs  Lab 02/26/19 1253  02/28/19 0027 03/01/19 0413 03/03/19 0338 03/04/19 0308 03/05/19 0250  WBC 13.7*   < > 15.7* 12.9* 14.2* 12.1* 13.2*  NEUTROABS 12.1*  --   --   --   --   --   --   HGB 9.7*   < > 8.5* 8.5* 7.9* 7.8* 8.1*  HCT 28.5*   < > 24.7* 24.4* 22.2* 22.5* 23.5*  MCV 80.3   < > 81.0 78.7* 75.8* 76.0* 76.1*  PLT 213   < > 169 178 156 155 174   < > = values in this interval not displayed.   Basic Metabolic Panel: Recent Labs  Lab 02/26/19 2116  02/28/19 0027 03/01/19 0413 03/03/19 0338 03/04/19 0308 03/05/19 0250  NA 131*   < > 136 135 130* 127* 128*  K  4.8   < > 3.8 3.6 4.4 3.7 4.4  CL 92*   < > 95* 96* 93* 91* 89*  CO2 20*   < > 24 25 22 26 25   GLUCOSE 70   < > 84 113* 100* 100* 82  BUN 77*   < > 29* 43* 35* 16 27*  CREATININE 12.06*   < > 6.47* 8.02* 6.30* 3.97* 5.31*  CALCIUM 9.1   < > 8.5* 8.7* 8.7* 8.2* 8.9  MG 2.2  --   --   --  2.1 2.0 2.3  PHOS  --   --   --   --  1.7* 1.6* 2.1*   < > = values in this interval not displayed.   GFR: Estimated Creatinine Clearance: 12.5 mL/min (A) (by C-G formula based on SCr of 5.31 mg/dL (H)). Liver Function Tests: Recent Labs  Lab 02/26/19 1253 02/27/19 1119 03/03/19 0338 03/04/19 0308 03/05/19 0250 03/05/19 1036  AST 46* 59*  --   --   --   --   ALT 20 21  --   --   --   --   ALKPHOS 146* 106  --   --   --   --   BILITOT 1.5* 1.5*  --   --   --  13.0*  PROT 8.0 6.7  --   --   --   --   ALBUMIN 3.3* 2.7* 2.2* 2.1* 2.1*  --    No results for input(s): LIPASE, AMYLASE in the last 168 hours. Recent Labs  Lab 03/03/19 1448  AMMONIA 26   Coagulation Profile: Recent Labs  Lab 03/01/19 0413 03/02/19 0442 03/03/19 0338 03/04/19 0308 03/05/19 0250  INR 2.1* 1.9* 2.9* 5.3* 6.9*   Cardiac Enzymes: Recent Labs  Lab 02/26/19 2116 02/27/19 1119  TROPONINI 0.04* 0.11*   BNP (last 3 results) No results for input(s): PROBNP in the last 8760 hours. HbA1C: Recent Labs    03/03/19 1448  HGBA1C 4.6*   CBG: Recent Labs  Lab 02/27/19 0030 02/27/19 0223 02/27/19 0555 02/27/19 0621 02/27/19 0705  GLUCAP 65* 60* 55* 71 70   Lipid Profile: No results for input(s): CHOL, HDL, LDLCALC, TRIG, CHOLHDL, LDLDIRECT in the last 72 hours. Thyroid Function Tests: Recent Labs    03/03/19 1448  TSH 1.441  FREET4 1.14   Anemia Panel: No results for input(s): VITAMINB12, FOLATE, FERRITIN, TIBC, IRON, RETICCTPCT in the last 72 hours. Urine analysis:    Component Value Date/Time   COLORURINE AMBER (A) 08/11/2018 1943   APPEARANCEUR CLOUDY (A) 08/11/2018 1943   LABSPEC 1.021  08/11/2018 1943  PHURINE 5.0 08/11/2018 1943   GLUCOSEU NEGATIVE 08/11/2018 1943   HGBUR LARGE (A) 08/11/2018 [place] NEGATIVE 08/11/2018 1943   KETONESUR 5 (A) 08/11/2018 1943   PROTEINUR 100 (A) 08/11/2018 1943   NITRITE NEGATIVE 08/11/2018 1943   LEUKOCYTESUR MODERATE (A) 08/11/2018 1943   Sepsis Labs: Invalid input(s): PROCALCITONIN, LACTICIDVEN  Recent Results (from the past 240 hour(s))  Culture, blood (Routine X 2) w Reflex to ID Panel     Status: Abnormal   Collection Time: 02/26/19  9:00 PM  Result Value Ref Range Status   Specimen Description BLOOD RIGHT HAND  Final   Special Requests   Final    BOTTLES DRAWN AEROBIC ONLY Blood Culture results may not be optimal due to an inadequate volume of blood received in culture bottles   Culture  Setup Time   Final    GRAM POSITIVE COCCI AEROBIC BOTTLE ONLY CRITICAL VALUE NOTED.  VALUE IS CONSISTENT WITH PREVIOUSLY REPORTED AND CALLED VALUE.    Culture (A)  Final    STAPHYLOCOCCUS AUREUS SUSCEPTIBILITIES PERFORMED ON PREVIOUS CULTURE WITHIN THE LAST 5 DAYS. Performed at [facility], [place] [address], [place], [place] [address]    Report Status 03/01/2019 FINAL  Final  Culture, blood (Routine X 2) w Reflex to ID Panel     Status: Abnormal   Collection Time: 02/26/19  9:05 PM  Result Value Ref Range Status   Specimen Description BLOOD RIGHT FOREARM  Final   Special Requests   Final    BOTTLES DRAWN AEROBIC ONLY Blood Culture adequate volume   Culture  Setup Time   Final    GRAM POSITIVE COCCI AEROBIC BOTTLE ONLY CRITICAL RESULT CALLED TO, READ BACK BY AND VERIFIED WITH: [place] [id number] AT 1054 AM BY CM Performed at [facility], [place] [address] [place], [place] [address]    Culture STAPHYLOCOCCUS AUREUS (A)  Final   Report Status 03/01/2019 FINAL  Final   Organism ID, Bacteria STAPHYLOCOCCUS AUREUS  Final      Susceptibility   Staphylococcus aureus - MIC*    CIPROFLOXACIN <=0.5 SENSITIVE  Sensitive     ERYTHROMYCIN <=0.25 SENSITIVE Sensitive     GENTAMICIN <=0.5 SENSITIVE Sensitive     OXACILLIN 0.5 SENSITIVE Sensitive     TETRACYCLINE <=1 SENSITIVE Sensitive     VANCOMYCIN 1 SENSITIVE Sensitive     TRIMETH/SULFA <=10 SENSITIVE Sensitive     CLINDAMYCIN <=0.25 SENSITIVE Sensitive     RIFAMPIN <=0.5 SENSITIVE Sensitive     Inducible Clindamycin NEGATIVE Sensitive     * STAPHYLOCOCCUS AUREUS  Blood Culture ID Panel (Reflexed)     Status: Abnormal   Collection Time: 02/26/19  9:05 PM  Result Value Ref Range Status   Enterococcus species NOT DETECTED NOT DETECTED Final   Listeria monocytogenes NOT DETECTED NOT DETECTED Final   Staphylococcus species DETECTED (A) NOT DETECTED Final    Comment: CRITICAL RESULT CALLED TO, READ BACK BY AND VERIFIED WITH: PHARMD M MACCIA [id number] AT 1056 AM BY CM    Staphylococcus aureus (BCID) DETECTED (A) NOT DETECTED Final    Comment: Methicillin (oxacillin) susceptible Staphylococcus aureus (MSSA). Preferred therapy is anti staphylococcal beta lactam antibiotic (Cefazolin or Nafcillin), unless clinically contraindicated. CRITICAL RESULT CALLED TO, READ BACK BY AND VERIFIED WITH: PHARMD M [place] [id number] AT 20 AM BY CM    Methicillin resistance NOT DETECTED NOT DETECTED Final   Streptococcus species DETECTED (A) NOT DETECTED Final    Comment: Not  Enterococcus species, Streptococcus agalactiae, Streptococcus pyogenes, or Streptococcus pneumoniae. CRITICAL RESULT CALLED TO, READ BACK BY AND VERIFIED WITH: PHARMD M [employer] [id number] AT 19 AM BY CM    Streptococcus agalactiae NOT DETECTED NOT DETECTED Final   Streptococcus pneumoniae NOT DETECTED NOT DETECTED Final   Streptococcus pyogenes NOT DETECTED NOT DETECTED Final   Acinetobacter baumannii NOT DETECTED NOT DETECTED Final   Enterobacteriaceae species NOT DETECTED NOT DETECTED Final   Enterobacter cloacae complex NOT DETECTED NOT DETECTED Final   Escherichia coli NOT DETECTED NOT DETECTED  Final   Klebsiella oxytoca NOT DETECTED NOT DETECTED Final   Klebsiella pneumoniae NOT DETECTED NOT DETECTED Final   Proteus species NOT DETECTED NOT DETECTED Final   Serratia marcescens NOT DETECTED NOT DETECTED Final   Haemophilus influenzae NOT DETECTED NOT DETECTED Final   Neisseria meningitidis NOT DETECTED NOT DETECTED Final   Pseudomonas aeruginosa NOT DETECTED NOT DETECTED Final   Candida albicans NOT DETECTED NOT DETECTED Final   Candida glabrata NOT DETECTED NOT DETECTED Final   Candida krusei NOT DETECTED NOT DETECTED Final   Candida parapsilosis NOT DETECTED NOT DETECTED Final   Candida tropicalis NOT DETECTED NOT DETECTED Final    Comment: Performed at [facility], [address], [place], [place] [address]  Culture, blood (Routine X 2) w Reflex to ID Panel     Status: None   Collection Time: 02/28/19  8:50 AM  Result Value Ref Range Status   Specimen Description BLOOD RIGHT HAND  Final   Special Requests   Final    BOTTLES DRAWN AEROBIC ONLY Blood Culture results may not be optimal due to an inadequate volume of blood received in culture bottles   Culture   Final    NO GROWTH 5 DAYS Performed at [facility], [place] [address], [place], [place] [address]    Report Status 03/05/2019 FINAL  Final  Culture, blood (Routine X 2) w Reflex to ID Panel     Status: None   Collection Time: 02/28/19  8:55 AM  Result Value Ref Range Status   Specimen Description BLOOD RIGHT HAND  Final   Special Requests   Final    BOTTLES DRAWN AEROBIC ONLY Blood Culture results may not be optimal due to an inadequate volume of blood received in culture bottles   Culture   Final    NO GROWTH 5 DAYS Performed at [facility], [place] [address], [place], [place] [address]    Report Status 03/05/2019 FINAL  Final     Radiology Studies: No results found.  Glenora Morocho T. [facility] Pager [phone]  If 7PM-7AM, please contact night-coverage [email or web address] Password  [email or web address] 03/05/2019, 12:43 PM

## 2019-03-06 DIAGNOSIS — K7290 Hepatic failure, unspecified without coma: Secondary | ICD-10-CM

## 2019-03-06 DIAGNOSIS — D689 Coagulation defect, unspecified: Secondary | ICD-10-CM

## 2019-03-06 DIAGNOSIS — Z515 Encounter for palliative care: Secondary | ICD-10-CM

## 2019-03-06 LAB — CBC
HCT: 21.3 % — ABNORMAL LOW (ref 39.0–52.0)
Hemoglobin: 7.6 g/dL — ABNORMAL LOW (ref 13.0–17.0)
MCH: 27 pg (ref 26.0–34.0)
MCHC: 35.7 g/dL (ref 30.0–36.0)
MCV: 75.8 fL — ABNORMAL LOW (ref 80.0–100.0)
Platelets: 177 10*3/uL (ref 150–400)
RBC: 2.81 MIL/uL — ABNORMAL LOW (ref 4.22–5.81)
RDW: 16.1 % — ABNORMAL HIGH (ref 11.5–15.5)
WBC: 11.8 10*3/uL — ABNORMAL HIGH (ref 4.0–10.5)
nRBC: 0.2 % (ref 0.0–0.2)

## 2019-03-06 LAB — HEPATIC FUNCTION PANEL
ALT: <5 U/L (ref 0–44)
AST: 30 U/L (ref 15–41)
Albumin: 1.8 g/dL — ABNORMAL LOW (ref 3.5–5.0)
Alkaline Phosphatase: 109 U/L (ref 38–126)
Bilirubin, Direct: 11.6 mg/dL — ABNORMAL HIGH (ref 0.0–0.2)
Indirect Bilirubin: 5.4 mg/dL — ABNORMAL HIGH (ref 0.3–0.9)
Total Bilirubin: 17 mg/dL — ABNORMAL HIGH (ref 0.3–1.2)
Total Protein: 6.8 g/dL (ref 6.5–8.1)

## 2019-03-06 LAB — RENAL FUNCTION PANEL
Albumin: 1.8 g/dL — ABNORMAL LOW (ref 3.5–5.0)
Anion gap: 13 (ref 5–15)
BUN: 15 mg/dL (ref 6–20)
CO2: 25 mmol/L (ref 22–32)
Calcium: 8.3 mg/dL — ABNORMAL LOW (ref 8.9–10.3)
Chloride: 91 mmol/L — ABNORMAL LOW (ref 98–111)
Creatinine, Ser: 3.32 mg/dL — ABNORMAL HIGH (ref 0.61–1.24)
GFR calc Af Amer: 22 mL/min — ABNORMAL LOW (ref >60)
GFR calc non Af Amer: 19 mL/min — ABNORMAL LOW (ref >60)
Glucose, Bld: 95 mg/dL (ref 70–99)
Phosphorus: 1.9 mg/dL — ABNORMAL LOW (ref 2.5–4.6)
Potassium: 3.5 mmol/L (ref 3.5–5.1)
Sodium: 129 mmol/L — ABNORMAL LOW (ref 135–145)

## 2019-03-06 LAB — DIC (DISSEMINATED INTRAVASCULAR COAGULATION)PANEL
D-Dimer, Quant: 9.73 ug/mL-FEU — ABNORMAL HIGH (ref 0.00–0.50)
Fibrinogen: 588 mg/dL — ABNORMAL HIGH (ref 210–475)
INR: 7.6 (ref 0.8–1.2)
Platelets: 188 10*3/uL (ref 150–400)
Prothrombin Time: 63.4 seconds — ABNORMAL HIGH (ref 11.4–15.2)
Smear Review: NONE SEEN
aPTT: 96 seconds — ABNORMAL HIGH (ref 24–36)

## 2019-03-06 LAB — MAGNESIUM: Magnesium: 2.1 mg/dL (ref 1.7–2.4)

## 2019-03-06 LAB — AMMONIA: Ammonia: 44 umol/L — ABNORMAL HIGH (ref 9–35)

## 2019-03-06 LAB — PROTIME-INR
INR: 7.2 (ref 0.8–1.2)
Prothrombin Time: 60.4 seconds — ABNORMAL HIGH (ref 11.4–15.2)

## 2019-03-06 LAB — HEMOGLOBIN AND HEMATOCRIT, BLOOD
HCT: 21.7 % — ABNORMAL LOW (ref 39.0–52.0)
Hemoglobin: 7.7 g/dL — ABNORMAL LOW (ref 13.0–17.0)

## 2019-03-06 MED ORDER — POLYVINYL ALCOHOL 1.4 % OP SOLN: 1.0000 [drp] | Freq: Four times a day (QID) | OPHTHALMIC | Status: DC | PRN

## 2019-03-06 MED ORDER — GLYCOPYRROLATE 0.2 MG/ML IJ SOLN: 0.2000 mg | INTRAMUSCULAR | Status: DC | PRN

## 2019-03-06 MED ORDER — HALOPERIDOL LACTATE 5 MG/ML IJ SOLN: 0.5000 mg | INTRAMUSCULAR | Status: DC | PRN

## 2019-03-06 MED ORDER — MORPHINE SULFATE (PF) 2 MG/ML IV SOLN: 1.0000 mg | INTRAVENOUS | Status: DC | PRN

## 2019-03-06 MED ORDER — LORAZEPAM 1 MG PO TABS: 1.0000 mg | ORAL_TABLET | ORAL | Status: DC | PRN

## 2019-03-06 MED ORDER — ACETAMINOPHEN 325 MG PO TABS: 650.0000 mg | ORAL_TABLET | Freq: Four times a day (QID) | ORAL | Status: DC | PRN

## 2019-03-06 MED ORDER — BIOTENE DRY MOUTH MT LIQD: 15.0000 mL | OROMUCOSAL | Status: DC | PRN

## 2019-03-06 MED ORDER — HALOPERIDOL LACTATE 2 MG/ML PO CONC
0.5000 mg | ORAL | Status: DC | PRN
  Filled 2019-03-06: qty 0.3

## 2019-03-06 MED ORDER — GLYCOPYRROLATE 1 MG PO TABS
1.0000 mg | ORAL_TABLET | ORAL | Status: DC | PRN
  Filled 2019-03-06: qty 1

## 2019-03-06 MED ORDER — HYDROMORPHONE HCL 1 MG/ML IJ SOLN
0.5000 mg | INTRAMUSCULAR | Status: DC | PRN
  Administered 2019-03-06: 1 mg via INTRAVENOUS
  Administered 2019-03-07 (×4): 0.5 mg via INTRAVENOUS
  Administered 2019-03-08 (×3): 1 mg via INTRAVENOUS
  Filled 2019-03-06 (×8): qty 1

## 2019-03-06 MED ORDER — LORAZEPAM 2 MG/ML IJ SOLN: 1.0000 mg | INTRAMUSCULAR | Status: DC | PRN

## 2019-03-06 MED ORDER — ACETAMINOPHEN 650 MG RE SUPP: 650.0000 mg | Freq: Four times a day (QID) | RECTAL | Status: DC | PRN

## 2019-03-06 MED ORDER — PHYTONADIONE 5 MG PO TABS
2.5000 mg | ORAL_TABLET | Freq: Once | ORAL | Status: AC
  Administered 2019-03-06: 09:00:00 2.5 mg via ORAL
  Filled 2019-03-06: qty 1

## 2019-03-06 MED ORDER — HALOPERIDOL 0.5 MG PO TABS
0.5000 mg | ORAL_TABLET | ORAL | Status: DC | PRN
  Filled 2019-03-06: qty 1

## 2019-03-06 MED ORDER — LORAZEPAM 2 MG/ML PO CONC: 1.0000 mg | ORAL | Status: DC | PRN

## 2019-03-06 NOTE — Progress Notes (Signed)
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 Apparently patient was yelling at his house and was found very confused and not making sense. He missed dialysis multiple times and has had slow, bleeding for about a week and INR was checked and found to be elevated.   In ED, confused and agitated. Cr 12.17, BUN 71, glucose 67, ALP 146, WBC 13.7, hemoglobin 9.7, INR 6.0, PT 38.9. CT of the head with no acute findings. Rectal temperature 100.2.  Blood cultures obtained.  Started on vancomycin and ceftriaxone for sepsis and admitted.  Patient was found to have MSSA and possible streptococcal bacteremia.  ID consulted.  Antibiotic narrowed to Ancef, rifampin and gentamicin.  Initially concerned about possible left wrist infection to be the source of his bacteremia.  However, MRI did not show evidence of infection but known fractures and TFC tear.  CT chest obtained on 4/15 by ID and showed RUL and RLL pneumonia with right moderate pleural effusion.  Other possible source of infection include prosthetic valves and HD access.  Patient's condition continued to deteriorate.  On 03/06/2019, patient became coagulopathic and jaundiced with multiorgan failure.  PTT/PT/INR 96/60 3/7.6 although he has been off warfarin for over 4 days, not in any other anticoagulation.  Ammonia elevated to 44, previously normal.  Also concern about GI bleed.  Reportedly had bloody bowel movement overnight although hemoglobin is relatively stable.  Total bilirubin elevated to 17.  Fractionated bili, ALT, smear and haptoglobin pending.  Given his current critical condition (multiorgan  failure) in light of his significant comorbidities and poor prognosis, nephrology (Dr. Melvia Heaps) who has been in contact with his daughter previously suggested transitioning to full comfort that I agrees. Patient does not have good comprehension his condition.  Dr. Jonnie Finner and I have attempted to call patient's daughter over the phone multiple times without success.  Comfort measures initiated.  Palliative care consulted for additional input.  See individual problem lists below for more.  Assessment & Plan: Active Problems:   Oral bleeding   Encephalopathy   Wrist fracture, closed, left, with routine healing, subsequent encounter   Metabolic encephalopathy   Bacteremia   Fracture   Hypoglycemia   MSSA bacteremia   Prosthetic valve endocarditis (HCC)  Acute metabolic encephalopathy: CT head without acute intracranial finding.  Now with multiorgan failure.  Ammonia elevated.  Coagulopathic. -Comfort measures initiated.  MSSA and possible streptococcal bacteremia: Multiple potential sources including HD access, bioprosthetic heart valve and pneumonia. MRI of left wrist without evidence of infection. RUL/RLL pneumonia-noted on CT chest on 4/15. ?Aspiration Moderate right pleural effusion-likely due to pneumonia as above -2D echo without vegetation.  Not a candidate for TEE per cardiology due to hemodynamics.  -ID treating for presumed endocarditis with Ancef, gentamicin and rifampin given prosthetic mitral valve.  -Repeat blood cultures from 02/28/2019- so far -Vancomycin and ceftriaxone 4/12. -Ancef 4/13--4/19 -rifampin 4/14--4/19 -gentamicin 4/14--4/19 -Comfort measures initiated.  Stable left distal radius and ulna fractures/TFC tear: Subacute.  This was noted on MRI of left wrist on 4/14 and x-ray on 02/03/2019 -Comfort measures initiated.  Chronic atrial fibrillation/WCT:  Heart rate 100-117.  -Cardiology -signed off. -Comfort measures initiated.  ESRD/BMD/anemia  of chronic  disease: On HD MWF at [facility] -Nephrology following -Comfort measures initiated.  History of CAD with known RPDA CTO from cath in 10/18 -Comfort measures  Abdominal pain: Seems to have resolved.  CT abdomen and pelvis without acute finding but cholelithiasis without cholecystitis, staghorn renal calculus in the L renal pelvis without hydronephrosis and  cirrhosis.  Hypertension/DM 2/leukocytoclastic vasculitis -Comfort measures initiated.  Scheduled Meds: . Chlorhexidine Gluconate Cloth  6 each Topical Q0600  . Chlorhexidine Gluconate Cloth  6 each Topical Q0600  . fluticasone  2 spray Each Nare Daily  . mouth rinse  15 mL Mouth Rinse BID  . sodium chloride flush  3 mL Intravenous Q12H   Continuous Infusions:  PRN Meds:.antiseptic oral rinse, glycopyrrolate **OR** glycopyrrolate **OR** glycopyrrolate, haloperidol **OR** haloperidol **OR** haloperidol lactate, lidocaine, LORazepam **OR** LORazepam **OR** LORazepam, morphine injection, ondansetron **OR** ondansetron (ZOFRAN) IV, polyvinyl alcohol, sodium chloride flush, white petrolatum  DVT prophylaxis: Warfarin on hold due to supratherapeutic INR. Code Status: Full code Family Communication: Multiple attempts to update patient's daughter about patient's deteriorating condition but no success.  She has no voicemail.  Called and updated daughter over the phone on 4/15  Disposition Plan: Remains inpatient for treatment of bacteremia and endocarditis with IV antibiotics pending improvement.  Final disposition SNF when clinically stable.  Consultants:   Infectious disease  Nephrology  Cardiology  IR  Procedures:   None  Antimicrobials:  As above  Objective: Vitals:   03/05/19 2011 03/05/19 2358 03/06/19 0553 03/06/19 0829  BP: (!) 75/57 (!) 79/56 (!) 78/56 (!) 90/49  Pulse: (!) 103 (!) 104 (!) 104 (!) 106  Resp: 20 14 19 16   Temp: 98.2 F (36.8 C) (!) 97.5 F (36.4 C) 98.3 F (36.8 C)   TempSrc: Oral Oral Oral    SpO2: 99% 100% 100% 96%  Weight:   62.4 kg   Height:   5\' 2"  (1.575 m)     Intake/Output Summary (Last 24 hours) at 03/06/2019 1209 Last data filed at 03/06/2019 0900 Gross per 24 hour  Intake 624 ml  Output 2392 ml  Net -1768 ml   Filed Weights   03/05/19 0500 03/05/19 0818 03/06/19 0553  Weight: 65.3 kg 65.9 kg 62.4 kg    Examination: GENERAL: Chronically ill-appearing.  Jaundiced. HEENT: Sclera anicteric. NECK: Supple.  LUNGS:  No IWOB.  Fair air movement. HEART: Tachycardic.  2/6 SEM over RUSB ABD: Bowel sounds present. Soft.  No significant tenderness. EXT:  no edema bilaterally.  SKIN: Jaundiced NEURO: Awake and oriented but poor comprehension. PSYCH: Appears to be in some distress.  Data Reviewed: I have independently reviewed following labs and imaging studies  CBC: Recent Labs  Lab 03/01/19 0413 03/03/19 0338 03/04/19 0308 03/05/19 0250 03/06/19 0305 03/06/19 0948  WBC 12.9* 14.2* 12.1* 13.2* 11.8*  --   HGB 8.5* 7.9* 7.8* 8.1* 7.6* 7.7*  HCT 24.4* 22.2* 22.5* 23.5* 21.3* 21.7*  MCV 78.7* 75.8* 76.0* 76.1* 75.8*  --   PLT 178 156 155 174 177 742   Basic Metabolic Panel: Recent Labs  Lab 03/01/19 0413 03/03/19 0338 03/04/19 0308 03/05/19 0250 03/06/19 0305  NA 135 130* 127* 128* 129*  K 3.6 4.4 3.7 4.4 3.5  CL 96* 93* 91* 89* 91*  CO2 25 22 26 25 25   GLUCOSE 113* 100* 100* 82 95  BUN 43* 35* 16 27* 15  CREATININE 8.02* 6.30* 3.97* 5.31* 3.32*  CALCIUM 8.7* 8.7* 8.2* 8.9 8.3*  MG  --  2.1  2.0 2.3 2.1  PHOS  --  1.7* 1.6* 2.1* 1.9*   GFR: Estimated Creatinine Clearance: 18.5 mL/min (A) (by C-G formula based on SCr of 3.32 mg/dL (H)). Liver Function Tests: Recent Labs  Lab 03/03/19 0338 03/04/19 0308 03/05/19 0250 03/05/19 1036 03/06/19 0305 03/06/19 0948  AST  --   --   --   --   --  30  ALT  --   --   --   --   --  PENDING  ALKPHOS  --   --   --   --   --  109  BILITOT  --   --   --  13.0*  --  17.0*  PROT  --   --   --   --   --   6.8  ALBUMIN 2.2* 2.1* 2.1*  --  1.8* 1.8*   No results for input(s): LIPASE, AMYLASE in the last 168 hours. Recent Labs  Lab 03/03/19 1448 03/06/19 1105  AMMONIA 26 44*   Coagulation Profile: Recent Labs  Lab 03/04/19 0308 03/05/19 0250 03/05/19 1352 03/06/19 0305 03/06/19 0948  INR 5.3* 6.9* 6.3* 7.2* 7.6*   Cardiac Enzymes: No results for input(s): CKTOTAL, CKMB, CKMBINDEX, TROPONINI in the last 168 hours. BNP (last 3 results) No results for input(s): PROBNP in the last 8760 hours. HbA1C: Recent Labs    03/03/19 1448  HGBA1C 4.6*   CBG: No results for input(s): GLUCAP in the last 168 hours. Lipid Profile: No results for input(s): CHOL, HDL, LDLCALC, TRIG, CHOLHDL, LDLDIRECT in the last 72 hours. Thyroid Function Tests: Recent Labs    03/03/19 1448  TSH 1.441  FREET4 1.14   Anemia Panel: No results for input(s): VITAMINB12, FOLATE, FERRITIN, TIBC, IRON, RETICCTPCT in the last 72 hours. Urine analysis:    Component Value Date/Time   COLORURINE AMBER (A) 08/11/2018 1943   APPEARANCEUR CLOUDY (A) 08/11/2018 1943   LABSPEC 1.021 08/11/2018 1943   PHURINE 5.0 08/11/2018 1943   GLUCOSEU NEGATIVE 08/11/2018 1943   HGBUR LARGE (A) 08/11/2018 [place] NEGATIVE 08/11/2018 1943   KETONESUR 5 (A) 08/11/2018 1943   PROTEINUR 100 (A) 08/11/2018 1943   NITRITE NEGATIVE 08/11/2018 1943   LEUKOCYTESUR MODERATE (A) 08/11/2018 1943   Sepsis Labs: Invalid input(s): PROCALCITONIN, LACTICIDVEN  Recent Results (from the past 240 hour(s))  Culture, blood (Routine X 2) w Reflex to ID Panel     Status: Abnormal   Collection Time: 02/26/19  9:00 PM  Result Value Ref Range Status   Specimen Description BLOOD RIGHT HAND  Final   Special Requests   Final    BOTTLES DRAWN AEROBIC ONLY Blood Culture results may not be optimal due to an inadequate volume of blood received in culture bottles   Culture  Setup Time   Final    GRAM POSITIVE COCCI AEROBIC BOTTLE ONLY  CRITICAL VALUE NOTED.  VALUE IS CONSISTENT WITH PREVIOUSLY REPORTED AND CALLED VALUE.    Culture (A)  Final    STAPHYLOCOCCUS AUREUS SUSCEPTIBILITIES PERFORMED ON PREVIOUS CULTURE WITHIN THE LAST 5 DAYS. Performed at [facility], [place] [address], [place], [place] [address]    Report Status 03/01/2019 FINAL  Final  Culture, blood (Routine X 2) w Reflex to ID Panel     Status: Abnormal   Collection Time: 02/26/19  9:05 PM  Result Value Ref Range Status   Specimen Description BLOOD RIGHT FOREARM  Final   Special Requests   Final  BOTTLES DRAWN AEROBIC ONLY Blood Culture adequate volume   Culture  Setup Time   Final    GRAM POSITIVE COCCI AEROBIC BOTTLE ONLY CRITICAL RESULT CALLED TO, READ BACK BY AND VERIFIED WITH: PHARMD M Goulds [id number] AT 1054 AM BY CM Performed at [facility], [place] [address], [place], [place] [address]    Culture STAPHYLOCOCCUS AUREUS (A)  Final   Report Status 03/01/2019 FINAL  Final   Organism ID, Bacteria STAPHYLOCOCCUS AUREUS  Final      Susceptibility   Staphylococcus aureus - MIC*    CIPROFLOXACIN <=0.5 SENSITIVE Sensitive     ERYTHROMYCIN <=0.25 SENSITIVE Sensitive     GENTAMICIN <=0.5 SENSITIVE Sensitive     OXACILLIN 0.5 SENSITIVE Sensitive     TETRACYCLINE <=1 SENSITIVE Sensitive     VANCOMYCIN 1 SENSITIVE Sensitive     TRIMETH/SULFA <=10 SENSITIVE Sensitive     CLINDAMYCIN <=0.25 SENSITIVE Sensitive     RIFAMPIN <=0.5 SENSITIVE Sensitive     Inducible Clindamycin NEGATIVE Sensitive     * STAPHYLOCOCCUS AUREUS  Blood Culture ID Panel (Reflexed)     Status: Abnormal   Collection Time: 02/26/19  9:05 PM  Result Value Ref Range Status   Enterococcus species NOT DETECTED NOT DETECTED Final   Listeria monocytogenes NOT DETECTED NOT DETECTED Final   Staphylococcus species DETECTED (A) NOT DETECTED Final    Comment: CRITICAL RESULT CALLED TO, READ BACK BY AND VERIFIED WITH: PHARMD M MACCIA [id number] AT 1056 AM BY CM    Staphylococcus  aureus (BCID) DETECTED (A) NOT DETECTED Final    Comment: Methicillin (oxacillin) susceptible Staphylococcus aureus (MSSA). Preferred therapy is anti staphylococcal beta lactam antibiotic (Cefazolin or Nafcillin), unless clinically contraindicated. CRITICAL RESULT CALLED TO, READ BACK BY AND VERIFIED WITH: PHARMD M [place] [id number] AT 18 AM BY CM    Methicillin resistance NOT DETECTED NOT DETECTED Final   Streptococcus species DETECTED (A) NOT DETECTED Final    Comment: Not Enterococcus species, Streptococcus agalactiae, Streptococcus pyogenes, or Streptococcus pneumoniae. CRITICAL RESULT CALLED TO, READ BACK BY AND VERIFIED WITH: PHARMD M [place] [id number] AT 29 AM BY CM    Streptococcus agalactiae NOT DETECTED NOT DETECTED Final   Streptococcus pneumoniae NOT DETECTED NOT DETECTED Final   Streptococcus pyogenes NOT DETECTED NOT DETECTED Final   Acinetobacter baumannii NOT DETECTED NOT DETECTED Final   Enterobacteriaceae species NOT DETECTED NOT DETECTED Final   Enterobacter cloacae complex NOT DETECTED NOT DETECTED Final   Escherichia coli NOT DETECTED NOT DETECTED Final   Klebsiella oxytoca NOT DETECTED NOT DETECTED Final   Klebsiella pneumoniae NOT DETECTED NOT DETECTED Final   Proteus species NOT DETECTED NOT DETECTED Final   Serratia marcescens NOT DETECTED NOT DETECTED Final   Haemophilus influenzae NOT DETECTED NOT DETECTED Final   Neisseria meningitidis NOT DETECTED NOT DETECTED Final   Pseudomonas aeruginosa NOT DETECTED NOT DETECTED Final   Candida albicans NOT DETECTED NOT DETECTED Final   Candida glabrata NOT DETECTED NOT DETECTED Final   Candida krusei NOT DETECTED NOT DETECTED Final   Candida parapsilosis NOT DETECTED NOT DETECTED Final   Candida tropicalis NOT DETECTED NOT DETECTED Final    Comment: Performed at [facility], [address] [place], [place] [address]  Culture, blood (Routine X 2) w Reflex to ID Panel     Status: None   Collection Time: 02/28/19   8:50 AM  Result Value Ref Range Status   Specimen Description BLOOD RIGHT HAND  Final   Special Requests  Final    BOTTLES DRAWN AEROBIC ONLY Blood Culture results may not be optimal due to an inadequate volume of blood received in culture bottles   Culture   Final    NO GROWTH 5 DAYS Performed at [facility], [place] [address], [place], [place] [address]    Report Status 03/05/2019 FINAL  Final  Culture, blood (Routine X 2) w Reflex to ID Panel     Status: None   Collection Time: 02/28/19  8:55 AM  Result Value Ref Range Status   Specimen Description BLOOD RIGHT HAND  Final   Special Requests   Final    BOTTLES DRAWN AEROBIC ONLY Blood Culture results may not be optimal due to an inadequate volume of blood received in culture bottles   Culture   Final    NO GROWTH 5 DAYS Performed at [facility], [place] [address], [place], [place] [address]    Report Status 03/05/2019 FINAL  Final     Radiology Studies: No results found.  Isac Lincks T. [facility] Pager [phone]  If 7PM-7AM, please contact night-coverage [email or web address] Password [facility] 03/06/2019, 12:09 PM

## 2019-03-06 NOTE — Progress Notes (Signed)
Pt has a large amount of bloody stool X [place] notified via [place]. Awaiting return page.

## 2019-03-06 NOTE — Progress Notes (Signed)
  [facility] Progress Note   Assessment/ Plan:    Assessment/Plan: 1. MSSA bacteremia - possible asp PNA. Wrist MRI neg. Possible endocarditis given prosthetic valve. On multiple IV abx.  2. Jaundice/ hx cirrhosis - new onset, tbili up 13 yest and 17 today, INR rising too (on coumadin as well). Looks like acute on chronic liver failure.  Pt feels very poorly.  I discussed liver decompensation with him and that all this is very serious.  He requested to "let me go" and to talk w/ his daughter.  I have tried the daughter but could not reach on the phone.  Have d/w the primary team who will take over.  Will sign off. No further dialysis. Hospice care.  3. H/o prosthetic mitral valve 4. AMS - due to infection, back to baseline 5. Volume - stable 6. Hypotension - baseline SBP 80's- 90's, on midodrine 10 tid chronically 7. ESRD on HD : HD today then resume MWFSat. 8. Atrial fib: on coumadin and po amio.  9.  Anemia ckd - hgb high 7's - 8's. Last tsat ok. Due for esa Monday, ordered.   10.  Metabolic bone disease - cont hectorol 1 ug. P low, no binders for now 11.  Nutrition - renal diet  12. DM - per primary 13. Chronic rash / leukocytoclastic vasculitis - stable - has had this for some time now - no heparin HD 14. Substance abuse - hx of meth use - drug screen pending 15. Left wrist fx - last month  16. H/o Cirrhosis 17. DNR - pt request, confirmed w/ daughter on phone w/ patient 4/17  Subjective:    As above   Objective:   BP (!) 90/49 (BP Location: Right Arm)   Pulse (!) 106   Temp 98.3 F (36.8 C) (Oral)   Resp 16   Ht 5\' 2"  (1.575 m)   Wt 62.4 kg   SpO2 96%   BMI 25.16 kg/m   Physical Exam: Gen: lying in bed, awake, on HD, chron ill and very weak, not in distress CVS: irregular Resp: clear anteriorly and lat Abd: slightly distended Ext: no LE edema Skin: diffuse chronic  petechial rash LE's and torso LUA AVF+bruit  Labs: BMET Recent Labs  Lab  02/28/19 0027 03/01/19 0413 03/03/19 0338 03/04/19 0308 03/05/19 0250 03/06/19 0305  NA 136 135 130* 127* 128* 129*  K 3.8 3.6 4.4 3.7 4.4 3.5  CL 95* 96* 93* 91* 89* 91*  CO2 24 25 22 26 25 25   GLUCOSE 84 113* 100* 100* 82 95  BUN 29* 43* 35* 16 27* 15  CREATININE 6.47* 8.02* 6.30* 3.97* 5.31* 3.32*  CALCIUM 8.5* 8.7* 8.7* 8.2* 8.9 8.3*  PHOS  --   --  1.7* 1.6* 2.1* 1.9*   CBC Recent Labs  Lab 03/03/19 0338 03/04/19 0308 03/05/19 0250 03/06/19 0305 03/06/19 0948  WBC 14.2* 12.1* 13.2* 11.8*  --   HGB 7.9* 7.8* 8.1* 7.6* 7.7*  HCT 22.2* 22.5* 23.5* 21.3* 21.7*  MCV 75.8* 76.0* 76.1* 75.8*  --   PLT 156 155 174 177 188    @IMGRELPRIORS @ Medications:    . Chlorhexidine Gluconate Cloth  6 each Topical Q0600  . Chlorhexidine Gluconate Cloth  6 each Topical Q0600  . fluticasone  2 spray Each Nare Daily  . mouth rinse  15 mL Mouth Rinse BID  . sodium chloride flush  3 mL Intravenous Q12H

## 2019-03-06 NOTE — Progress Notes (Signed)
CRITICAL VALUE ALERT  Critical Value:  INR 7.2  Date & Time Notied:  03/06/2019  0540  Provider Notified: Jeannette Corpus  Orders Received/Actions taken: awaiting orders  Dyann Ruddle, RN

## 2019-03-06 NOTE — Progress Notes (Signed)
ANTICOAGULATION CONSULT NOTE - Follow Up Consult  Pharmacy Consult for Coumadin Indication: PAF/MVR  Allergies  Allergen Reactions  . Triamcinolone Other (See Comments)    Felt like skin was burning    Patient Measurements: Height: 5\' 2"  (157.5 cm) Weight: 137 lb 9.1 oz (62.4 kg) IBW/kg (Calculated) : 54.6  Vital Signs: Temp: 98.3 F (36.8 C) (04/19 0553) Temp Source: Oral (04/19 0553) BP: 90/49 (04/19 0829) Pulse Rate: 106 (04/19 0829)  Labs: Recent Labs    03/04/19 0308 03/05/19 0250 03/05/19 1352 03/06/19 0305  HGB 7.8* 8.1*  --  7.6*  HCT 22.5* 23.5*  --  21.3*  PLT 155 174  --  177  LABPROT 47.4* 58.6* 54.4* 60.4*  INR 5.3* 6.9* 6.3* 7.2*  CREATININE 3.97* 5.31*  --  3.32*    Estimated Creatinine Clearance: 18.5 mL/min (A) (by C-G formula based on SCr of 3.32 mg/dL (H)).  Assessment: 58 yo male with: h/o PAF/MVR on Coumadin 2.5 mg daily PTA, s/p MAZE and MVR porcine. Pharmacy dosing Coumadin. He is on amiodarone and rifampin which will greatly influence his coumadin requirements -INR 1.9>> 2.0>> 5.3 > 7.2.   Last Coumadin dose given 4/15.  Per RN chart notes, pt with bloody stool overnight.  Hgb slightly down.  Goal of Therapy:  INR 2-3 Monitor platelets by anticoagulation protocol: Yes   Plan:  -Hold Coumadin again tonight -Monitor daily INR -Could consider small dose of vitamin K?  Marguerite Olea, [facility] Clinical Pharmacist Phone [phone]  03/06/2019 8:33 AM

## 2019-03-07 LAB — HAPTOGLOBIN: Haptoglobin: 164 mg/dL (ref 29–370)

## 2019-03-07 NOTE — TOC Progression Note (Signed)
Transition of Care (TOC) - Progression Note  Marvetta Gibbons RN, BSN Transitions of Care Unit 4E- RN Case Manager [phone]   Patient Details  Name: Michael [name] MRN: [id number] Date of Birth: 26-Sep-1961  Transition of Care [facility]) CM/SW Contact  Michael [name] Michael Rabon, RN Phone Number: [phone] 03/07/2019, 3:48 PM  Clinical Narrative:    Pt now comfort care/DNR- referral received for [facility]- pt's daughter prefers [place] location- Call made to [place] with [employer] for referral to [facility]- awaiting confirmation for placement to [place] will need GOLD DNR signed for transport once bed confirmed.  Have notified Butch Penny with [facility] of new transition plan for Hospice.   Expected Discharge Plan: Skilled Nursing Facility Barriers to Discharge: Other (comment)([facility] trying to get in touch with daughter)  Expected Discharge Plan and Services Expected Discharge Plan: [place] In-house Referral: Clinical Social Work Discharge Planning Services: CM Consult Post Acute Care Choice: Hospice Living arrangements for the past 2 months: Single Family Home                           Social Determinants of Health (SDOH) Interventions    Readmission Risk Interventions No flowsheet data found.

## 2019-03-07 NOTE — Consult Note (Signed)
   [facility] CM Inpatient Consult   03/07/2019  Michael [name] 06-Mar-1961 [id number]    Patient screened for potential [facility] Care Management services with his Medicare/ Next Gen plan and due to unplanned readmission risk score of 40% (extreme). He has been active with [facility] CM in the past.  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 Patient was brought in by EMS due to altered mental status- was yelling at his house and was found very confused and not making sense, had missed dialysis multiple times and has had slow, bleeding for about a week and INR was checked and found to be elevated.  Patient was found to have MSSA and possible streptococcal bacteremia. CT chest showed RUL and RLL pneumonia with right moderate pleural effusion.   Patient's condition continued to deteriorate. On 03/06/2019, patient became coagulopathic and jaundiced with multiorgan failure. Daughter made aware per MD. No more HD per nephrology.  Patient now has new disposition plan from SNF (skilled nursing facility) to [facility] and has transitioned to comfort care/ DNR per transition of care CM note reviewed and noted referral to [facility] and awaiting confirmation for placement there.  There are no [facility] care management needs identified at this point.   For questions and additional information, please call:  Aran Menning A. Ziyon Soltau, BSN, RN-BC [name] Liaison Cell: [phone]

## 2019-03-07 NOTE — Progress Notes (Signed)
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 Apparently patient was yelling at his house and was found very confused and not making sense. He missed dialysis multiple times and has had slow, bleeding for about a week and INR was checked and found to be elevated.   In ED, confused and agitated. Cr 12.17, BUN 71, glucose 67, ALP 146, WBC 13.7, hemoglobin 9.7, INR 6.0, PT 38.9. CT of the head with no acute findings. Rectal temperature 100.2.  Blood cultures obtained.  Started on vancomycin and ceftriaxone for sepsis and admitted.  Patient was found to have MSSA and possible streptococcal bacteremia.  ID consulted.  Antibiotic narrowed to Ancef, rifampin and gentamicin.  Initially concerned about possible left wrist infection to be the source of his bacteremia.  However, MRI did not show evidence of infection but known fractures and TFC tear.  CT chest obtained on 4/15 by ID and showed RUL and RLL pneumonia with right moderate pleural effusion.  Other possible source of infection include prosthetic valves and HD access.  Patient's condition continued to deteriorate.  On 03/06/2019, patient became coagulopathic and jaundiced with multiorgan failure.  PTT/PT/INR 96/60 3/7.6 although he has been off warfarin for over 4 days, not in any other anticoagulation.  Ammonia elevated to 44, previously normal.  Also concern about GI bleed.  Reportedly had bloody bowel movement overnight although hemoglobin is relatively stable. Total bilirubin elevated to 17, fractionating to 11.6 direct and 5.4 indirect. ALT <AST 5. Smear without schistocytes. Haptoglobin pending.   Patient expressed his desire for comfort measures saying "let me go" to nephrology.  He confirmed this with me.  Nephrology won't do HD anymore.  Given his current critical condition (multiorgan failure) in light of his significant comorbidities and poor prognosis, I concur with nephrologist recommendation and honor patient's wish.initiated comfort measures on 03/06/2019 after multiple attempts to talk to patient's daughter by Dr. Melvia Heaps and me.  On 03/07/2019, I was finally able to talk to his daughter, Michael [name] over the phone.  Updated her about his condition and the decision we made.  She agrees with the plan and appreciated the care. She asked about final disposition. I told her we are working on getting him to residential hospice which she appreciated.  Subjective: No major events overnight of this morning.  Complains about left arm pain, neck pain and left leg pain. Asks for apple juice. No other complaint. No acute distress.  Assessment & Plan: Active Problems:   Oral bleeding   Encephalopathy   Wrist fracture, closed, left, with routine healing, subsequent encounter   Metabolic encephalopathy   Bacteremia   Fracture   Hypoglycemia   MSSA bacteremia   Prosthetic valve endocarditis (HCC)  Acute metabolic encephalopathy: CT head without acute intracranial finding.  Now with multiorgan failure.  Ammonia elevated.  Coagulopathic.  MSSA and possible streptococcal bacteremia: Multiple potential sources including HD access, bioprosthetic heart valve and pneumonia. MRI of left wrist without evidence of infection. RUL/RLL pneumonia-noted on CT chest on 4/15. ?Aspiration Moderate right pleural effusion-likely due to pneumonia as above -2D echo without vegetation.  Not a candidate for TEE per cardiology due to hemodynamics.  -ID  treating for presumed endocarditis with Ancef, gentamicin and rifampin given prosthetic mitral valve.  -Repeat blood cultures from 02/28/2019- so far -Vancomycin  and ceftriaxone 4/12. -Ancef 4/13--4/19 -rifampin 4/14--4/19 -gentamicin 4/14--4/19  Stable left distal radius and ulna fractures/TFC tear: Subacute.  This was noted on MRI of left wrist on 4/14 and x-ray on 02/03/2019  Chronic atrial fibrillation/WCT:  Heart rate 100-117.  -Cardiology -signed off.  ESRD/BMD/anemia of chronic disease: On HD MWF at [facility] -No more HD by nephrology  History of CAD with known RPDA CTO from cath in 10/18 -Comfort measures  Abdominal pain: Seems to have resolved.  CT abdomen and pelvis without acute finding but cholelithiasis without cholecystitis, staghorn renal calculus in the L renal pelvis without hydronephrosis and  cirrhosis.  Hypertension/DM 2/leukocytoclastic vasculitis -Comfort measures initiated.  Comfort measures initiated on 03/06/2019.  Patient to be transferred to residential hospice whenever bed is available.  Case social work consulted. Scheduled Meds: . Chlorhexidine Gluconate Cloth  6 each Topical Q0600  . Chlorhexidine Gluconate Cloth  6 each Topical Q0600  . fluticasone  2 spray Each Nare Daily  . mouth rinse  15 mL Mouth Rinse BID  . sodium chloride flush  3 mL Intravenous Q12H   Continuous Infusions:  PRN Meds:.antiseptic oral rinse, glycopyrrolate **OR** glycopyrrolate **OR** glycopyrrolate, haloperidol **OR** haloperidol **OR** haloperidol lactate, HYDROmorphone (DILAUDID) injection, lidocaine, LORazepam **OR** LORazepam **OR** LORazepam, ondansetron **OR** ondansetron (ZOFRAN) IV, polyvinyl alcohol, sodium chloride flush, white petrolatum  DVT prophylaxis: Warfarin on hold due to supratherapeutic INR. Code Status: Full code Family Communication: Discussed with patient's daughter over the phone on 03/07/2019.   Called and updated daughter over the phone on 4/15  Disposition Plan: Residential hospice when bed is available.  Consultants:   Infectious disease  Nephrology  Cardiology  IR  Procedures:   None   Antimicrobials:  As above  Objective: Vitals:   03/06/19 0553 03/06/19 0829 03/06/19 1953 03/07/19 0441  BP: (!) 78/56 (!) 90/49 (!) 76/62 (!) 76/56  Pulse: (!) 104 (!) 106  (!) 103  Resp: 19 16  18   Temp: 98.3 F (36.8 C)  98.1 F (36.7 C) 97.9 F (36.6 C)  TempSrc: Oral  Oral Oral  SpO2: 100% 96% 95% 97%  Weight: 62.4 kg   63.5 kg  Height: 5\' 2"  (1.575 m)       Intake/Output Summary (Last 24 hours) at 03/07/2019 1121 Last data filed at 03/06/2019 1821 Gross per 24 hour  Intake 80 ml  Output -  Net 80 ml   Filed Weights   03/05/19 0818 03/06/19 0553 03/07/19 0441  Weight: 65.9 kg 62.4 kg 63.5 kg    Examination: GENERAL: Chronically ill-appearing.  Jaundiced. LUNGS: No distress HEART: Tachycardic low 100s on monitor EXT:  no edema bilaterally.  SKIN: Jaundiced NEURO: Awake and oriented appropriately PSYCH: Appears to be in some distress.  Data Reviewed: I have independently reviewed following labs and imaging studies  CBC: Recent Labs  Lab 03/01/19 0413 03/03/19 0338 03/04/19 0308 03/05/19 0250 03/06/19 0305 03/06/19 0948  WBC 12.9* 14.2* 12.1* 13.2* 11.8*  --   HGB 8.5* 7.9* 7.8* 8.1* 7.6* 7.7*  HCT 24.4* 22.2* 22.5* 23.5* 21.3* 21.7*  MCV 78.7* 75.8* 76.0* 76.1* 75.8*  --   PLT 178 156 155 174 177 297   Basic Metabolic Panel: Recent Labs  Lab 03/01/19 0413 03/03/19 0338 03/04/19 0308 03/05/19 0250 03/06/19 0305  NA 135 130* 127* 128* 129*  K 3.6 4.4 3.7 4.4 3.5  CL  96* 93* 91* 89* 91*  CO2 25 22 26 25 25   GLUCOSE 113* 100* 100* 82 95  BUN 43* 35* 16 27* 15  CREATININE 8.02* 6.30* 3.97* 5.31* 3.32*  CALCIUM 8.7* 8.7* 8.2* 8.9 8.3*  MG  --  2.1 2.0 2.3 2.1  PHOS  --  1.7* 1.6* 2.1* 1.9*   GFR: Estimated Creatinine Clearance: 18.5 mL/min (A) (by C-G formula based on SCr of 3.32 mg/dL (H)). Liver Function Tests: Recent Labs  Lab 03/03/19 0338 03/04/19 0308 03/05/19 0250 03/05/19 1036 03/06/19 0305 03/06/19 0948  AST  --   --   --    --   --  30  ALT  --   --   --   --   --  <5  ALKPHOS  --   --   --   --   --  109  BILITOT  --   --   --  13.0*  --  17.0*  PROT  --   --   --   --   --  6.8  ALBUMIN 2.2* 2.1* 2.1*  --  1.8* 1.8*   No results for input(s): LIPASE, AMYLASE in the last 168 hours. Recent Labs  Lab 03/03/19 1448 03/06/19 1105  AMMONIA 26 44*   Coagulation Profile: Recent Labs  Lab 03/04/19 0308 03/05/19 0250 03/05/19 1352 03/06/19 0305 03/06/19 0948  INR 5.3* 6.9* 6.3* 7.2* 7.6*   Cardiac Enzymes: No results for input(s): CKTOTAL, CKMB, CKMBINDEX, TROPONINI in the last 168 hours. BNP (last 3 results) No results for input(s): PROBNP in the last 8760 hours. HbA1C: No results for input(s): HGBA1C in the last 72 hours. CBG: No results for input(s): GLUCAP in the last 168 hours. Lipid Profile: No results for input(s): CHOL, HDL, LDLCALC, TRIG, CHOLHDL, LDLDIRECT in the last 72 hours. Thyroid Function Tests: No results for input(s): TSH, T4TOTAL, FREET4, T3FREE, THYROIDAB in the last 72 hours. Anemia Panel: No results for input(s): VITAMINB12, FOLATE, FERRITIN, TIBC, IRON, RETICCTPCT in the last 72 hours. Urine analysis:    Component Value Date/Time   COLORURINE AMBER (A) 08/11/2018 1943   APPEARANCEUR CLOUDY (A) 08/11/2018 1943   LABSPEC 1.021 08/11/2018 1943   PHURINE 5.0 08/11/2018 1943   GLUCOSEU NEGATIVE 08/11/2018 1943   HGBUR LARGE (A) 08/11/2018 [place] NEGATIVE 08/11/2018 1943   KETONESUR 5 (A) 08/11/2018 1943   PROTEINUR 100 (A) 08/11/2018 1943   NITRITE NEGATIVE 08/11/2018 1943   LEUKOCYTESUR MODERATE (A) 08/11/2018 1943   Sepsis Labs: Invalid input(s): PROCALCITONIN, LACTICIDVEN  Recent Results (from the past 240 hour(s))  Culture, blood (Routine X 2) w Reflex to ID Panel     Status: Abnormal   Collection Time: 02/26/19  9:00 PM  Result Value Ref Range Status   Specimen Description BLOOD RIGHT HAND  Final   Special Requests   Final    BOTTLES DRAWN AEROBIC  ONLY Blood Culture results may not be optimal due to an inadequate volume of blood received in culture bottles   Culture  Setup Time   Final    GRAM POSITIVE COCCI AEROBIC BOTTLE ONLY CRITICAL VALUE NOTED.  VALUE IS CONSISTENT WITH PREVIOUSLY REPORTED AND CALLED VALUE.    Culture (A)  Final    STAPHYLOCOCCUS AUREUS SUSCEPTIBILITIES PERFORMED ON PREVIOUS CULTURE WITHIN THE LAST 5 DAYS. Performed at [facility], [place] [address], [place], [place] [address]    Report Status 03/01/2019 FINAL  Final  Culture, blood (Routine X 2) w  Reflex to ID Panel     Status: Abnormal   Collection Time: 02/26/19  9:05 PM  Result Value Ref Range Status   Specimen Description BLOOD RIGHT FOREARM  Final   Special Requests   Final    BOTTLES DRAWN AEROBIC ONLY Blood Culture adequate volume   Culture  Setup Time   Final    GRAM POSITIVE COCCI AEROBIC BOTTLE ONLY CRITICAL RESULT CALLED TO, READ BACK BY AND VERIFIED WITH: PHARMD M [place] [id number] AT 1054 AM BY CM Performed at [facility], [place] [address] [place], [place] [address]    Culture STAPHYLOCOCCUS AUREUS (A)  Final   Report Status 03/01/2019 FINAL  Final   Organism ID, Bacteria STAPHYLOCOCCUS AUREUS  Final      Susceptibility   Staphylococcus aureus - MIC*    CIPROFLOXACIN <=0.5 SENSITIVE Sensitive     ERYTHROMYCIN <=0.25 SENSITIVE Sensitive     GENTAMICIN <=0.5 SENSITIVE Sensitive     OXACILLIN 0.5 SENSITIVE Sensitive     TETRACYCLINE <=1 SENSITIVE Sensitive     VANCOMYCIN 1 SENSITIVE Sensitive     TRIMETH/SULFA <=10 SENSITIVE Sensitive     CLINDAMYCIN <=0.25 SENSITIVE Sensitive     RIFAMPIN <=0.5 SENSITIVE Sensitive     Inducible Clindamycin NEGATIVE Sensitive     * STAPHYLOCOCCUS AUREUS  Blood Culture ID Panel (Reflexed)     Status: Abnormal   Collection Time: 02/26/19  9:05 PM  Result Value Ref Range Status   Enterococcus species NOT DETECTED NOT DETECTED Final   Listeria monocytogenes NOT DETECTED NOT DETECTED Final    Staphylococcus species DETECTED (A) NOT DETECTED Final    Comment: CRITICAL RESULT CALLED TO, READ BACK BY AND VERIFIED WITH: PHARMD M [place] [id number] AT 1056 AM BY CM    Staphylococcus aureus (BCID) DETECTED (A) NOT DETECTED Final    Comment: Methicillin (oxacillin) susceptible Staphylococcus aureus (MSSA). Preferred therapy is anti staphylococcal beta lactam antibiotic (Cefazolin or Nafcillin), unless clinically contraindicated. CRITICAL RESULT CALLED TO, READ BACK BY AND VERIFIED WITH: PHARMD M [employer] [id number] AT 66 AM BY CM    Methicillin resistance NOT DETECTED NOT DETECTED Final   Streptococcus species DETECTED (A) NOT DETECTED Final    Comment: Not Enterococcus species, Streptococcus agalactiae, Streptococcus pyogenes, or Streptococcus pneumoniae. CRITICAL RESULT CALLED TO, READ BACK BY AND VERIFIED WITH: PHARMD M [place] [id number] AT 89 AM BY CM    Streptococcus agalactiae NOT DETECTED NOT DETECTED Final   Streptococcus pneumoniae NOT DETECTED NOT DETECTED Final   Streptococcus pyogenes NOT DETECTED NOT DETECTED Final   Acinetobacter baumannii NOT DETECTED NOT DETECTED Final   Enterobacteriaceae species NOT DETECTED NOT DETECTED Final   Enterobacter cloacae complex NOT DETECTED NOT DETECTED Final   Escherichia coli NOT DETECTED NOT DETECTED Final   Klebsiella oxytoca NOT DETECTED NOT DETECTED Final   Klebsiella pneumoniae NOT DETECTED NOT DETECTED Final   Proteus species NOT DETECTED NOT DETECTED Final   Serratia marcescens NOT DETECTED NOT DETECTED Final   Haemophilus influenzae NOT DETECTED NOT DETECTED Final   Neisseria meningitidis NOT DETECTED NOT DETECTED Final   Pseudomonas aeruginosa NOT DETECTED NOT DETECTED Final   Candida albicans NOT DETECTED NOT DETECTED Final   Candida glabrata NOT DETECTED NOT DETECTED Final   Candida krusei NOT DETECTED NOT DETECTED Final   Candida parapsilosis NOT DETECTED NOT DETECTED Final   Candida tropicalis NOT DETECTED NOT DETECTED Final     Comment: Performed at [facility], [address], [place], [place] [address]  Culture, blood (Routine X 2) w Reflex to ID Panel     Status: None   Collection Time: 02/28/19  8:50 AM  Result Value Ref Range Status   Specimen Description BLOOD RIGHT HAND  Final   Special Requests   Final    BOTTLES DRAWN AEROBIC ONLY Blood Culture results may not be optimal due to an inadequate volume of blood received in culture bottles   Culture   Final    NO GROWTH 5 DAYS Performed at [facility], [place] [address], [place], [place] [address]    Report Status 03/05/2019 FINAL  Final  Culture, blood (Routine X 2) w Reflex to ID Panel     Status: None   Collection Time: 02/28/19  8:55 AM  Result Value Ref Range Status   Specimen Description BLOOD RIGHT HAND  Final   Special Requests   Final    BOTTLES DRAWN AEROBIC ONLY Blood Culture results may not be optimal due to an inadequate volume of blood received in culture bottles   Culture   Final    NO GROWTH 5 DAYS Performed at [facility], [place] [address] [place], [place] [address]    Report Status 03/05/2019 FINAL  Final     Radiology Studies: No results found.  Caillou Minus T. [facility] Pager [phone]  If 7PM-7AM, please contact night-coverage [email or web address] Password [facility] 03/07/2019, 11:21 AM

## 2019-03-07 NOTE — Progress Notes (Signed)
Manufacturing engineer [facility]) Hospital Liaison note.   Received request from Marvetta Gibbons for family interest in [place] with request for transfer on 17-Mar-2019. Chart reviewed and eligibility confirmed. Spoke with family to confirm interest and explain services. Family agreeable to transfer on 03-17-2019.  Registration paper work completed. Dr.Enoborg Newlin to assume care per family request.  Please fax discharge summary to [phone]. RN please call report to [phone]. Please arrange transport for patient on the morning of 03-17-19.    Thank you,      Farrel Gordon, RN, [facility]   [place]     [place] are on AMION

## 2019-03-08 MED ORDER — POLYVINYL ALCOHOL 1.4 % OP SOLN: 1.0000 [drp] | Freq: Four times a day (QID) | OPHTHALMIC | 0 refills | Status: AC | PRN

## 2019-03-08 MED ORDER — GLYCOPYRROLATE 0.2 MG/ML IJ SOLN: 0.2000 mg | INTRAMUSCULAR | Status: AC | PRN

## 2019-03-08 MED ORDER — HALOPERIDOL LACTATE 5 MG/ML IJ SOLN: 0.5000 mg | INTRAMUSCULAR | Status: AC | PRN

## 2019-03-08 MED ORDER — LORAZEPAM 1 MG PO TABS: 1.0000 mg | ORAL_TABLET | ORAL | 0 refills | Status: AC | PRN

## 2019-03-08 MED ORDER — MORPHINE SULFATE (CONCENTRATE) 10 MG /0.5 ML PO SOLN: 10.0000 mg | ORAL | 0 refills | Status: AC | PRN

## 2019-03-08 MED ORDER — WHITE PETROLATUM EX OINT: 1.0000 "application " | TOPICAL_OINTMENT | CUTANEOUS | 0 refills | Status: AC | PRN

## 2019-03-08 MED ORDER — BIOTENE DRY MOUTH MT LIQD: 15.0000 mL | OROMUCOSAL | Status: AC | PRN

## 2019-03-08 MED ORDER — GLYCOPYRROLATE 1 MG PO TABS: 1.0000 mg | ORAL_TABLET | ORAL | Status: AC | PRN

## 2019-03-08 MED ORDER — ONDANSETRON HCL 4 MG PO TABS: 4.0000 mg | ORAL_TABLET | Freq: Four times a day (QID) | ORAL | 0 refills | Status: AC | PRN

## 2019-03-08 MED ORDER — LIDOCAINE VISCOUS HCL 2 % MT SOLN: 15.0000 mL | Freq: Four times a day (QID) | OROMUCOSAL | 0 refills | Status: AC | PRN

## 2019-03-08 MED ORDER — HALOPERIDOL LACTATE 2 MG/ML PO CONC: 0.6000 mg | ORAL | 0 refills | Status: AC | PRN

## 2019-03-09 ENCOUNTER — Other Ambulatory Visit: Payer: Self-pay

## 2019-03-09 NOTE — Patient Outreach (Signed)
Screening: Attempted to reach patient for screening from medicare tier 5.  No answer.  PLAN: Will Corporate treasurer and call back in 3 days.  Tomasa Rand, RN, BSN, CEN [facility] [employer] [phone]

## 2019-03-10 ENCOUNTER — Telehealth: Payer: Self-pay | Admitting: Physician Assistant

## 2019-03-10 NOTE — Telephone Encounter (Signed)
° °  03/10/2019 :   Left VM for patient to return call regarding visit on 03/15/2019

## 2019-03-11 ENCOUNTER — Other Ambulatory Visit: Payer: Self-pay

## 2019-03-11 NOTE — Telephone Encounter (Signed)
LVMOM WITH LANGUAGE LINE [phone] ASSISTANCE  TO [facility] BACK FOR CONSENT ON VISIT

## 2019-03-11 NOTE — Patient Outreach (Signed)
Screening tier 5 medicare referral:  Placed 2nd call to patient with no answer.   PLAN: letter already mailed. Will attempt another call in 3 business days.

## 2019-03-15 ENCOUNTER — Telehealth: Payer: Medicare Other | Admitting: Physician Assistant

## 2019-03-15 ENCOUNTER — Other Ambulatory Visit: Payer: Self-pay

## 2019-03-15 NOTE — Progress Notes (Signed)
Erroneous encounter - please disregard.

## 2019-03-17 ENCOUNTER — Other Ambulatory Visit: Payer: Self-pay

## 2019-03-17 NOTE — Patient Outreach (Signed)
Screening: Medicare tier 5  Placed 3rd outreach call to patient with no answer.  PLAN: will await a response from outreach letter if no response will close on May 6 as unable to reach.  Tomasa Rand, RN, BSN, CEN [facility] [employer] [phone]

## 2019-03-18 NOTE — Progress Notes (Signed)
[facility] and gave updated report. All questions answered. RN aware Corey Harold has been called and patient awaiting transport. Pt resting with call bell within reach.  Will continue to monitor.Payton Emerald, RN

## 2019-03-18 NOTE — Discharge Summary (Signed)
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 Apparently patient was yelling at his house and was found very confused and not making sense. He missed dialysis multiple times and has had slow, bleeding for about a week and INR was checked and found to be elevated.   In ED, confused and agitated. Cr 12.17, BUN 71, glucose 67, ALP 146, WBC 13.7, hemoglobin 9.7, INR 6.0, PT 38.9. CT of the head with no acute findings. Rectal temperature 100.2.  Blood cultures obtained.  Started on vancomycin and ceftriaxone for sepsis and admitted.  Patient was found to have MSSA and possible streptococcal bacteremia.  ID consulted.  Antibiotic narrowed to Ancef, rifampin and gentamicin.  Initially concerned about possible left wrist infection to be the source of his bacteremia.  However, MRI did not show evidence of infection but known fractures and TFC tear.  CT chest obtained on 4/15 by ID and showed RUL and RLL pneumonia with right moderate pleural effusion.  Other possible source of infection include prosthetic valves and HD access.  Patient's condition continued to deteriorate.  On 03/06/2019, patient became coagulopathic and jaundiced with multiorgan failure.  PTT/PT/INR 96/60 3/7.6 although he has been off warfarin for over 4 days, not in any other anticoagulation.  Ammonia elevated to 44, previously normal.  Also concern about GI bleed.  Reportedly had bloody bowel movement overnight although hemoglobin is relatively  stable. Total bilirubin elevated to 17, fractionating to 11.6 direct and 5.4 indirect. ALT <AST 5. Smear without schistocytes. Haptoglobin pending.  Patient expressed his desire for comfort measures saying "let me go" to nephrology.  He confirmed this with me.  Nephrology won't do HD anymore.  Given his current critical condition (multiorgan failure) in light of his significant comorbidities and poor prognosis, I concur with nephrologist recommendation and honor patient's wish.initiated comfort measures on 03/06/2019 after multiple attempts to talk to patient's daughter by Dr. Melvia Heaps and me.  On 03/07/2019, I was finally able to talk to his daughter, [name] over the phone.  Updated her about his condition and the decision we made.  She agrees with the plan and appreciated the care. She asked about final disposition. I told her we are working on getting him to residential hospice which she appreciated.  Case social work consulted for hospice placement.  Patient was accepted and transferred to [place] on 04/04/19.  See individual problem list below for more.  Discharge Diagnoses:  Active Problems:   Oral bleeding   Encephalopathy   Wrist fracture, closed, left, with routine healing, subsequent encounter   Metabolic encephalopathy   Bacteremia   Fracture   Hypoglycemia   MSSA bacteremia   Prosthetic valve endocarditis (HCC)  Acute metabolic encephalopathy: CT head without acute intracranial finding.  Now with multiorgan failure.  Ammonia elevated.  Coagulopathic.  MSSA and possible streptococcal bacteremia: Multiple potential sources including HD access, bioprosthetic heart valve and pneumonia. MRI of left wrist without evidence of infection. RUL/RLL pneumonia-noted on CT chest on 4/15.  Possible aspiration.  Moderate right pleural effusion-likely due to pneumonia as above -2D echo without  vegetation.  Not a candidate for TEE per cardiology due to hemodynamics.  -ID treating  for presumed endocarditis with Ancef, gentamicin and rifampin given prosthetic mitral valve.  -Repeat blood cultures from 02/28/2019- so far -Vancomycin and ceftriaxone 4/12. -Ancef 4/13--4/19 -rifampin 4/14--4/19 -gentamicin 4/14--4/19  Stable left distal radius and ulna fractures/TFC tear: Subacute.  This was noted on MRI of left wrist on 4/14 and x-ray on 02/03/2019  Chronic atrial fibrillation/WCT:  Heart rate 100-117.  -Cardiology -signed off.  ESRD/BMD/anemia of chronic disease: On HD MWF at [facility] -No more HD by nephrology as of 03/06/2019  History of CAD with known RPDA CTO from cath in 10/ 2018  Abdominal pain: Seems to have resolved.  CT abdomen and pelvis without acute finding but cholelithiasis without cholecystitis, staghorn renal calculus in the L renal pelvis without hydronephrosis and  cirrhosis.  Hypertension/DM 2/leukocytoclastic vasculitis -Comfort measures initiated.  Discharge Instructions   Allergies as of 2019-03-30      Reactions   Triamcinolone Other (See Comments)   Felt like skin was burning      Medication List    STOP taking these medications   amiodarone 200 MG tablet Commonly known as:  PACERONE   atorvastatin 20 MG tablet Commonly known as:  LIPITOR   baclofen 10 MG tablet Commonly known as:  LIORESAL   calcium acetate 667 MG capsule Commonly known as:  PHOSLO   cetirizine 10 MG tablet Commonly known as:  ZYRTEC   colchicine 0.6 MG tablet   Dialyvite/Zinc Tabs   diazepam 5 MG tablet Commonly known as:  VALIUM   diphenhydramine-acetaminophen 25-500 MG Tabs tablet Commonly known as:  TYLENOL PM   DULoxetine 60 MG capsule Commonly known as:  Cymbalta   fluticasone 50 MCG/ACT nasal spray Commonly known as:  FLONASE   folic acid 1 MG tablet Commonly known as:  FOLVITE   gabapentin 100 MG capsule Commonly known as:  NEURONTIN   metoprolol tartrate 25 MG tablet Commonly known as:  LOPRESSOR   midodrine 10 MG  tablet Commonly known as:  PROAMATINE   multivitamin Tabs tablet   OXYGEN   traZODone 100 MG tablet Commonly known as:  DESYREL   warfarin 2.5 MG tablet Commonly known as:  COUMADIN     TAKE these medications   antiseptic oral rinse Liqd Apply 15 mLs topically as needed for dry mouth.   glycopyrrolate 1 MG tablet Commonly known as:  ROBINUL Take 1 tablet (1 mg total) by mouth every 4 (four) hours as needed (excessive secretions).   glycopyrrolate 0.2 MG/ML injection Commonly known as:  ROBINUL Inject 1 mL (0.2 mg total) into the skin every 4 (four) hours as needed (excessive secretions).   haloperidol 2 MG/ML solution Commonly known as:  HALDOL Place 0.3 mLs (0.6 mg total) under the tongue every 4 (four) hours as needed for agitation (or delirium).   haloperidol lactate 5 MG/ML injection Commonly known as:  HALDOL Inject 0.1 mLs (0.5 mg total) into the vein every 4 (four) hours as needed (or delirium).   lidocaine 2 % solution Commonly known as:  XYLOCAINE Use as directed 15 mLs in the mouth or throat every 6 (six) hours as needed for mouth pain. What changed:    how much to take  how to take this  when to take this  reasons to take this  additional instructions   LORazepam 1 MG tablet Commonly known as:  ATIVAN Take 1 tablet (1 mg total) by mouth every 4 (four)  hours as needed for anxiety.   morphine CONCENTRATE 10 mg / 0.5 ml concentrated solution Take 0.5 mLs (10 mg total) by mouth every 2 (two) hours as needed for severe pain.   ondansetron 4 MG tablet Commonly known as:  ZOFRAN Take 1 tablet (4 mg total) by mouth every 6 (six) hours as needed for nausea.   polyvinyl alcohol 1.4 % ophthalmic solution Commonly known as:  LIQUIFILM TEARS Place 1 drop into both eyes 4 (four) times daily as needed for dry eyes.   white petrolatum Oint Commonly known as:  VASELINE Apply 1 application topically as needed for lip care.         Consultations:  Infectious disease  Nephrology  Cardiology  IR  Procedures/Studies:  2D Echo: 1. The left ventricle has normal systolic function with an ejection fraction of 60-65%. The cavity size was normal, with D-shaped septum. There is mildly increased left ventricular wall thickness. Left ventricular diastolic function could not be  evaluated due to mitral valve replacement/repair. There is right ventricular volume and pressure overload.  2. The right ventricle has moderately reduced systolic function. The cavity was severely enlarged. There is no increase in right ventricular wall thickness. Right ventricular systolic pressure is severely elevated with an estimated pressure of 55.4  mmHg.  3. A bioprosthetic valve is present in the mitral position. Procedure Date: 2012 There is no Echo evidence of rocking, dehiscence or vegetation of the mitral prosthesis, though images are suboptimal due to suboptimal acoustic windows.  4. MV mean prosthetic diastolic CZYS:0.6 mmHg at HR 104 BPM.  5. Status post tricuspid valve repair. 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 Incomplete coaptation of tricuspid valve leaflets.  6. Tricuspid valve diastolic mean gradient 2.6 mmHg at HR 121 bpm.  7. 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  8. Severe biatrial enlargement.  9. The inferior vena cava was dilated in size with <50% respiratory variability. 10. No definite valvular vegetations seen.  Ct Abdomen Pelvis Wo Contrast  Result Date: 02/26/2019 CLINICAL DATA:  Abdominal pain, acute. EXAM: CT ABDOMEN AND PELVIS WITHOUT CONTRAST TECHNIQUE: Multidetector CT imaging of the abdomen and pelvis was performed following the standard protocol without IV contrast. COMPARISON:  CT abdomen dated 07/13/2017. FINDINGS: Lower chest: RIGHT pleural effusion, at least moderate in size, incompletely imaged. Rounded consolidation  within the RIGHT lower lobe, atelectasis versus pneumonia. LEFT lung base is clear. Cardiomegaly, incompletely imaged. Hepatobiliary: Cirrhotic appearing liver. Cholelithiasis without evidence of acute cholecystitis. No bile duct dilatation seen. Pancreas: Unremarkable. No pancreatic ductal dilatation or surrounding inflammatory changes. Spleen: Mild splenomegaly. Otherwise unremarkable. Adrenals/Urinary Tract: Staghorn calculus within the LEFT renal pelvis. No associated hydronephrosis. RIGHT kidney is unremarkable without stone or hydronephrosis. Bladder is unremarkable, partially decompressed. Stomach/Bowel: No dilated large or small bowel loops. Characterization of the bowel is limited by patient motion artifact, however, there is no pericolonic fluid or other secondary signs of active bowel wall inflammation. Moderate amount of gas and stool within the colon. Moderate amount of gas and fluid within the nondistended stomach. Vascular/Lymphatic: Aortic atherosclerosis. No enlarged lymph nodes seen. Reproductive: Prostate is unremarkable. Other: No free fluid or abscess collection seen. No free intraperitoneal air seen. Musculoskeletal: No acute or suspicious osseous finding. Chronic appearing compression deformities of the T11, L3 and L4 vertebral bodies. IMPRESSION: 1. New RIGHT pleural effusion, at least moderate in size, incompletely imaged. New rounded consolidation within the right lower lobe, atelectasis versus pneumonia.  2. No acute findings within the abdomen or pelvis. No bowel obstruction or evidence of bowel wall inflammation. No evidence of acute solid organ abnormality. No free fluid or abscess collection seen. No free intraperitoneal air. 3. Cirrhotic appearing liver. 4. Cholelithiasis without evidence of acute cholecystitis. 5. Staghorn calculus within the left renal pelvis. No associated hydronephrosis. 6. Additional chronic/incidental findings detailed above. Aortic Atherosclerosis (ICD10-I70.0).  Electronically Signed   By: Franki Cabot M.D.   On: 02/26/2019 21:05   Dg Wrist 2 Views Left  Result Date: 02/26/2019 CLINICAL DATA:  58 year old who fell and injured the LEFT wrist approximately 1 month ago. Persistent pain. Subsequent encounter. EXAM: LEFT WRIST - 2 VIEW COMPARISON:  02/03/2019. FINDINGS: Subacute comminuted and impacted intra-articular fracture involving the distal radius into multiple fragments, unchanged from the 02/03/2019 examination with the exception of perhaps slightly more impaction currently. Stable comminuted fracture involving the distal ulna, including the ulnar styloid. No new interval fractures. Radiocarpal joint space narrowing. Possible VISI deformity of the wrist. IMPRESSION: 1. Subacute comminuted and impacted intra-articular fracture involving the distal radius into multiple fragments, unchanged since the 02/03/19 examination with the exception of perhaps slightly more impaction currently. 2. Stable comminuted fracture involving the distal ulna, including the ulnar styloid. 3. No new interval fractures. 4. Possible VISI deformity of the wrist. Electronically Signed   By: Evangeline Dakin M.D.   On: 02/26/2019 13:45   Ct Head Wo Contrast  Result Date: 02/26/2019 CLINICAL DATA:  Altered mental status. EXAM: CT HEAD WITHOUT CONTRAST TECHNIQUE: Contiguous axial images were obtained from the base of the skull through the vertex without intravenous contrast. COMPARISON:  CT scan of February 03, 2019. FINDINGS: Brain: No evidence of acute infarction, hemorrhage, hydrocephalus, extra-axial collection or mass lesion/mass effect. Vascular: No hyperdense vessel or unexpected calcification. Skull: Normal. Negative for fracture or focal lesion. Sinuses/Orbits: No acute finding. Other: None. IMPRESSION: No acute intracranial abnormality seen. Electronically Signed   By: Marijo Conception, M.D.   On: 02/26/2019 16:38   Ct Chest Wo Contrast  Result Date: 03/02/2019 CLINICAL DATA:   Pleural effusion EXAM: CT CHEST WITHOUT CONTRAST TECHNIQUE: Multidetector CT imaging of the chest was performed following the standard protocol without IV contrast. COMPARISON:  Chest x-ray 02/26/2019 FINDINGS: Cardiovascular: No significant vascular findings. Enlarged heart size. No pericardial effusion. Thoracic aortic atherosclerosis. Mediastinum/Nodes: No axillary lymphadenopathy. No hilar lymphadenopathy. Mild mediastinal lymphadenopathy with the largest right paratracheal lymph node measuring 12 mm in short axis. Precarinal lymph node measures 18 mm in short axis. Esophagus, thyroid gland and trachea are normal. Lungs/Pleura: Moderate right pleural effusion with right basilar airspace disease which may reflect atelectasis versus pneumonia. Right upper lobe airspace disease. small left lower lobe pleural effusion. No pneumothorax. Mild bilateral interstitial thickening. Upper Abdomen: No acute abnormality. Musculoskeletal: No acute osseous abnormality. No aggressive osseous lesion. Chronic T11 vertebral body compression fracture. IMPRESSION: 1. Moderate right pleural effusion with right upper lobe and lower lobe airspace disease concerning for pneumonia. 2. Small left pleural effusion. 3. Likely an element of underlying mild CHF. 4.  Aortic Atherosclerosis (ICD10-I70.0). Electronically Signed   By: Kathreen Devoid   On: 03/02/2019 15:08   Mr Wrist Left Wo Contrast  Result Date: 03/02/2019 CLINICAL DATA:  Wrist fracture. Bacteremia. Multiple underlying medical problems including diabetes, vasculitis, cirrhosis and end-stage renal disease. EXAM: MR OF THE LEFT WRIST WITHOUT CONTRAST TECHNIQUE: Multiplanar, multisequence MR imaging of the left wrist was performed. No intravenous contrast was administered. COMPARISON:  Radiographs 02/26/2019  and 02/03/2019. FINDINGS: Despite efforts by the technologist and patient, severe motion artifact is present on today's exam and could not be eliminated. This reduces exam  sensitivity and specificity. The study is nearly nondiagnostic. Ligaments: Suboptimally visualized due to motion. Triangular fibrocartilage: There is a full-thickness tear of the triangular fibrocartilage near the radial attachment, best seen images 9 and 10 of series 11. Tendons: The wrist tendons appear intact without significant tenosynovitis. Carpal tunnel/median nerve: Grossly unremarkable. Guyon's canal: Grossly unremarkable. Joint/cartilage: There is a small to moderate amount of fluid within all of the wrist joint compartments. No gross chondral defect or subchondral signal abnormality. Bones/carpal alignment: Comminuted and impacted fractures of the distal radius and ulna are grossly stable from the prior radiographs. No carpal bone fracture, dislocation or bone destruction identified. Other: There is soft tissue swelling and fluid surrounding the distal forearm fractures, especially volar to the distal radius. IMPRESSION: 1. Significantly limited study due to motion. Detail is significantly limited. 2. No unexpected findings are seen in this patient with known subacute fractures of the distal radius and ulna. No evidence of carpal bone fracture or bone destruction. 3. TFCC tear. Electronically Signed   By: Richardean Sale M.D.   On: 03/02/2019 08:15   Dg Chest Port 1 View  Result Date: 02/26/2019 CLINICAL DATA:  Tachycardia EXAM: PORTABLE CHEST 1 VIEW COMPARISON:  02/03/2019 FINDINGS: Moderate cardiomegaly. Vascular congestion. Stable right pleural effusion and right basilar opacities. Minimal subsegmental atelectasis at the left base. Upper lungs clear. No pneumothorax. IMPRESSION: Stable vascular congestion. Stable right pleural effusion and basilar pulmonary opacities. Electronically Signed   By: Marybelle Killings M.D.   On: 02/26/2019 13:24     Subjective: No major events overnight of this morning.  Lying comfortably.  Does not appear to be in distress.  Discharge Exam: Vitals:   03/07/19 2012  Mar 23, 2019 0400  BP: (!) 82/62 (!) 71/56  Pulse: (!) 114 90  Resp: 18 10  Temp: (!) 97.4 F (36.3 C) 98.2 F (36.8 C)  SpO2: 94% 93%    GENERAL: Lying comfortably.  No acute distress. HEENT: Grossly normal. LUNGS: On room air.  No distress. HEART:  RRR. Heart sounds normal. ABD: Grossly normal. EXT:   no edema bilaterally.  SKIN: no apparent skin lesion.  NEURO: Sleepy.  No distress.  Grossly intact. PSYCH: Calm.    The results of significant diagnostics from this hospitalization (including imaging, microbiology, ancillary and laboratory) are listed below for reference.     Microbiology: Recent Results (from the past 240 hour(s))  Culture, blood (Routine X 2) w Reflex to ID Panel     Status: Abnormal   Collection Time: 02/26/19  9:00 PM  Result Value Ref Range Status   Specimen Description BLOOD RIGHT HAND  Final   Special Requests   Final    BOTTLES DRAWN AEROBIC ONLY Blood Culture results may not be optimal due to an inadequate volume of blood received in culture bottles   Culture  Setup Time   Final    GRAM POSITIVE COCCI AEROBIC BOTTLE ONLY CRITICAL VALUE NOTED.  VALUE IS CONSISTENT WITH PREVIOUSLY REPORTED AND CALLED VALUE.    Culture (A)  Final    STAPHYLOCOCCUS AUREUS SUSCEPTIBILITIES PERFORMED ON PREVIOUS CULTURE WITHIN THE LAST 5 DAYS. Performed at [facility], [place] [address] [place], [place] [address]    Report Status 03/01/2019 FINAL  Final  Culture, blood (Routine X 2) w Reflex to ID Panel     Status: Abnormal  Collection Time: 02/26/19  9:05 PM  Result Value Ref Range Status   Specimen Description BLOOD RIGHT FOREARM  Final   Special Requests   Final    BOTTLES DRAWN AEROBIC ONLY Blood Culture adequate volume   Culture  Setup Time   Final    GRAM POSITIVE COCCI AEROBIC BOTTLE ONLY CRITICAL RESULT CALLED TO, READ BACK BY AND VERIFIED WITH: PHARMD M [place] [id number] AT 1054 AM BY CM Performed at [facility], [place] [address] [place],  [place] [address]    Culture STAPHYLOCOCCUS AUREUS (A)  Final   Report Status 03/01/2019 FINAL  Final   Organism ID, Bacteria STAPHYLOCOCCUS AUREUS  Final      Susceptibility   Staphylococcus aureus - MIC*    CIPROFLOXACIN <=0.5 SENSITIVE Sensitive     ERYTHROMYCIN <=0.25 SENSITIVE Sensitive     GENTAMICIN <=0.5 SENSITIVE Sensitive     OXACILLIN 0.5 SENSITIVE Sensitive     TETRACYCLINE <=1 SENSITIVE Sensitive     VANCOMYCIN 1 SENSITIVE Sensitive     TRIMETH/SULFA <=10 SENSITIVE Sensitive     CLINDAMYCIN <=0.25 SENSITIVE Sensitive     RIFAMPIN <=0.5 SENSITIVE Sensitive     Inducible Clindamycin NEGATIVE Sensitive     * STAPHYLOCOCCUS AUREUS  Blood Culture ID Panel (Reflexed)     Status: Abnormal   Collection Time: 02/26/19  9:05 PM  Result Value Ref Range Status   Enterococcus species NOT DETECTED NOT DETECTED Final   Listeria monocytogenes NOT DETECTED NOT DETECTED Final   Staphylococcus species DETECTED (A) NOT DETECTED Final    Comment: CRITICAL RESULT CALLED TO, READ BACK BY AND VERIFIED WITH: PHARMD M [place] [id number] AT 1056 AM BY CM    Staphylococcus aureus (BCID) DETECTED (A) NOT DETECTED Final    Comment: Methicillin (oxacillin) susceptible Staphylococcus aureus (MSSA). Preferred therapy is anti staphylococcal beta lactam antibiotic (Cefazolin or Nafcillin), unless clinically contraindicated. CRITICAL RESULT CALLED TO, READ BACK BY AND VERIFIED WITH: PHARMD M [place] [id number] AT 47 AM BY CM    Methicillin resistance NOT DETECTED NOT DETECTED Final   Streptococcus species DETECTED (A) NOT DETECTED Final    Comment: Not Enterococcus species, Streptococcus agalactiae, Streptococcus pyogenes, or Streptococcus pneumoniae. CRITICAL RESULT CALLED TO, READ BACK BY AND VERIFIED WITH: PHARMD M [place] [id number] AT 68 AM BY CM    Streptococcus agalactiae NOT DETECTED NOT DETECTED Final   Streptococcus pneumoniae NOT DETECTED NOT DETECTED Final   Streptococcus pyogenes NOT DETECTED NOT DETECTED  Final   Acinetobacter baumannii NOT DETECTED NOT DETECTED Final   Enterobacteriaceae species NOT DETECTED NOT DETECTED Final   Enterobacter cloacae complex NOT DETECTED NOT DETECTED Final   Escherichia coli NOT DETECTED NOT DETECTED Final   Klebsiella oxytoca NOT DETECTED NOT DETECTED Final   Klebsiella pneumoniae NOT DETECTED NOT DETECTED Final   Proteus species NOT DETECTED NOT DETECTED Final   Serratia marcescens NOT DETECTED NOT DETECTED Final   Haemophilus influenzae NOT DETECTED NOT DETECTED Final   Neisseria meningitidis NOT DETECTED NOT DETECTED Final   Pseudomonas aeruginosa NOT DETECTED NOT DETECTED Final   Candida albicans NOT DETECTED NOT DETECTED Final   Candida glabrata NOT DETECTED NOT DETECTED Final   Candida krusei NOT DETECTED NOT DETECTED Final   Candida parapsilosis NOT DETECTED NOT DETECTED Final   Candida tropicalis NOT DETECTED NOT DETECTED Final    Comment: Performed at [facility], [address] [place], [place] [address]  Culture, blood (Routine X 2) w Reflex to ID Panel  Status: None   Collection Time: 02/28/19  8:50 AM  Result Value Ref Range Status   Specimen Description BLOOD RIGHT HAND  Final   Special Requests   Final    BOTTLES DRAWN AEROBIC ONLY Blood Culture results may not be optimal due to an inadequate volume of blood received in culture bottles   Culture   Final    NO GROWTH 5 DAYS Performed at [facility], [place] [address], [place], [place] [address]    Report Status 03/05/2019 FINAL  Final  Culture, blood (Routine X 2) w Reflex to ID Panel     Status: None   Collection Time: 02/28/19  8:55 AM  Result Value Ref Range Status   Specimen Description BLOOD RIGHT HAND  Final   Special Requests   Final    BOTTLES DRAWN AEROBIC ONLY Blood Culture results may not be optimal due to an inadequate volume of blood received in culture bottles   Culture   Final    NO GROWTH 5 DAYS Performed at [facility], [place] [address]  [place], [place] [address]    Report Status 03/05/2019 FINAL  Final     Labs: BNP (last 3 results) No results for input(s): BNP in the last 8760 hours. Basic Metabolic Panel: Recent Labs  Lab 03/03/19 0338 03/04/19 0308 03/05/19 0250 03/06/19 0305  NA 130* 127* 128* 129*  K 4.4 3.7 4.4 3.5  CL 93* 91* 89* 91*  CO2 22 26 25 25   GLUCOSE 100* 100* 82 95  BUN 35* 16 27* 15  CREATININE 6.30* 3.97* 5.31* 3.32*  CALCIUM 8.7* 8.2* 8.9 8.3*  MG 2.1 2.0 2.3 2.1  PHOS 1.7* 1.6* 2.1* 1.9*   Liver Function Tests: Recent Labs  Lab 03/03/19 0338 03/04/19 0308 03/05/19 0250 03/05/19 1036 03/06/19 0305 03/06/19 0948  AST  --   --   --   --   --  30  ALT  --   --   --   --   --  <5  ALKPHOS  --   --   --   --   --  109  BILITOT  --   --   --  13.0*  --  17.0*  PROT  --   --   --   --   --  6.8  ALBUMIN 2.2* 2.1* 2.1*  --  1.8* 1.8*   No results for input(s): LIPASE, AMYLASE in the last 168 hours. Recent Labs  Lab 03/03/19 1448 03/06/19 1105  AMMONIA 26 44*   CBC: Recent Labs  Lab 03/03/19 0338 03/04/19 0308 03/05/19 0250 03/06/19 0305 03/06/19 0948  WBC 14.2* 12.1* 13.2* 11.8*  --   HGB 7.9* 7.8* 8.1* 7.6* 7.7*  HCT 22.2* 22.5* 23.5* 21.3* 21.7*  MCV 75.8* 76.0* 76.1* 75.8*  --   PLT 156 155 174 177 188   Cardiac Enzymes: No results for input(s): CKTOTAL, CKMB, CKMBINDEX, TROPONINI in the last 168 hours. BNP: Invalid input(s): POCBNP CBG: No results for input(s): GLUCAP in the last 168 hours. D-Dimer Recent Labs    03/06/19 0948  DDIMER 9.73*   Hgb A1c No results for input(s): HGBA1C in the last 72 hours. Lipid Profile No results for input(s): CHOL, HDL, LDLCALC, TRIG, CHOLHDL, LDLDIRECT in the last 72 hours. Thyroid function studies No results for input(s): TSH, T4TOTAL, T3FREE, THYROIDAB in the last 72 hours.  Invalid input(s): FREET3 Anemia work up No results for input(s): VITAMINB12, FOLATE, FERRITIN, TIBC, IRON,  RETICCTPCT in the last 72  hours. Urinalysis    Component Value Date/Time   COLORURINE AMBER (A) 08/11/2018 1943   APPEARANCEUR CLOUDY (A) 08/11/2018 1943   LABSPEC 1.021 08/11/2018 1943   PHURINE 5.0 08/11/2018 1943   GLUCOSEU NEGATIVE 08/11/2018 1943   HGBUR LARGE (A) 08/11/2018 [place] NEGATIVE 08/11/2018 1943   KETONESUR 5 (A) 08/11/2018 1943   PROTEINUR 100 (A) 08/11/2018 1943   NITRITE NEGATIVE 08/11/2018 1943   LEUKOCYTESUR MODERATE (A) 08/11/2018 1943   Sepsis Labs Invalid input(s): PROCALCITONIN,  WBC,  LACTICIDVEN   Time coordinating discharge: 35 minutes  SIGNED:  Mercy Riding, MD  [facility] 03/18/19, 8:14 AM Pager [phone]  If 7PM-7AM, please contact night-coverage [email or web address] Password [email or web address]

## 2019-03-18 NOTE — Progress Notes (Signed)
Pt had a small stool that was bloody and black. No other signs of bleeding. Will continue to monitor. Lajoyce Corners, RN

## 2019-03-18 NOTE — Progress Notes (Signed)
Patient moaning. Used audio interpreter [phone], 1st name [place]) to ascertain what was going on. Pt said he was nauseous. Mouth secretions suctioned using yankauer. Also reported pain 10/10 "everywhere." Patient given Zofran 4 mg IV and 0.5 mg dilaudid IV. Will continue to monitor. Lajoyce Corners, RN

## 2019-03-18 NOTE — TOC Transition Note (Signed)
Transition of Care [facility]) - CM/SW Discharge Note Marvetta Gibbons RN, BSN Transitions of Care Unit 4E- RN Case Manager [phone]  Patient Details  Name: Michael [name] MRN: [id number] Date of Birth: 1961/09/16  Transition of Care [facility]) CM/SW Contact:  Dawayne Patricia, RN Phone Number: [phone] 04/06/19, 10:19 AM   Clinical Narrative:    Pt has been approved for hospice at [facility] and bed available today- d/c order placed for transition to [facility] today. GOLD DNR signed and placed along with transport paperwork on shadowchart. D/C summary has been faxed to [facility] as per request via Epic. [facility] has been called for transport- and bedside RN aware.    Final next level of care: [place] Barriers to Discharge: No Barriers Identified   Patient Goals and CMS Choice Patient states their goals for this hospitalization and ongoing recovery are:: "comfort care, let me go" CMS Medicare.gov Compare Post Acute Care list provided to:: Patient Choice offered to / list presented to : Patient, Adult Children  Discharge Placement  Transition to [facility] for comfort care.                      Discharge Plan and Services In-house Referral: Clinical Social Work Discharge Planning Services: CM Consult Post Acute Care Choice: Hospice                    Social Determinants of Health (SDOH) Interventions     Readmission Risk Interventions Readmission Risk Prevention Plan Apr 06, 2019  Transportation Screening Complete  Medication Review [employer]) Complete  PCP or Specialist appointment within 3-5 days of discharge Not Complete  PCP/Specialist Appt Not Complete comments pt going to [facility]   [place] or [place] Complete  SW Recovery Care/Counseling Consult Not Complete  SW Consult Not Complete Comments na  Palliative Care Screening Complete  [place] Not Applicable  Some recent data might be hidden

## 2019-03-18 DEATH — deceased

## 2019-03-23 ENCOUNTER — Other Ambulatory Visit: Payer: Self-pay

## 2019-03-23 NOTE — Patient Outreach (Signed)
Case closure:  No response to letters or outreach calls.  PLAN: case closed.  Tomasa Rand, RN, BSN, CEN [facility] [employer] [phone]

## 2019-09-29 IMAGING — DX DG CHEST 1V PORT
1 series · 1 of 1 positions shown · non-contrast
Comparison: 08/06/2018 at 0606 hours

CLINICAL DATA: Status post central line placement.

EXAM:
PORTABLE CHEST 1 VIEW

[chest]
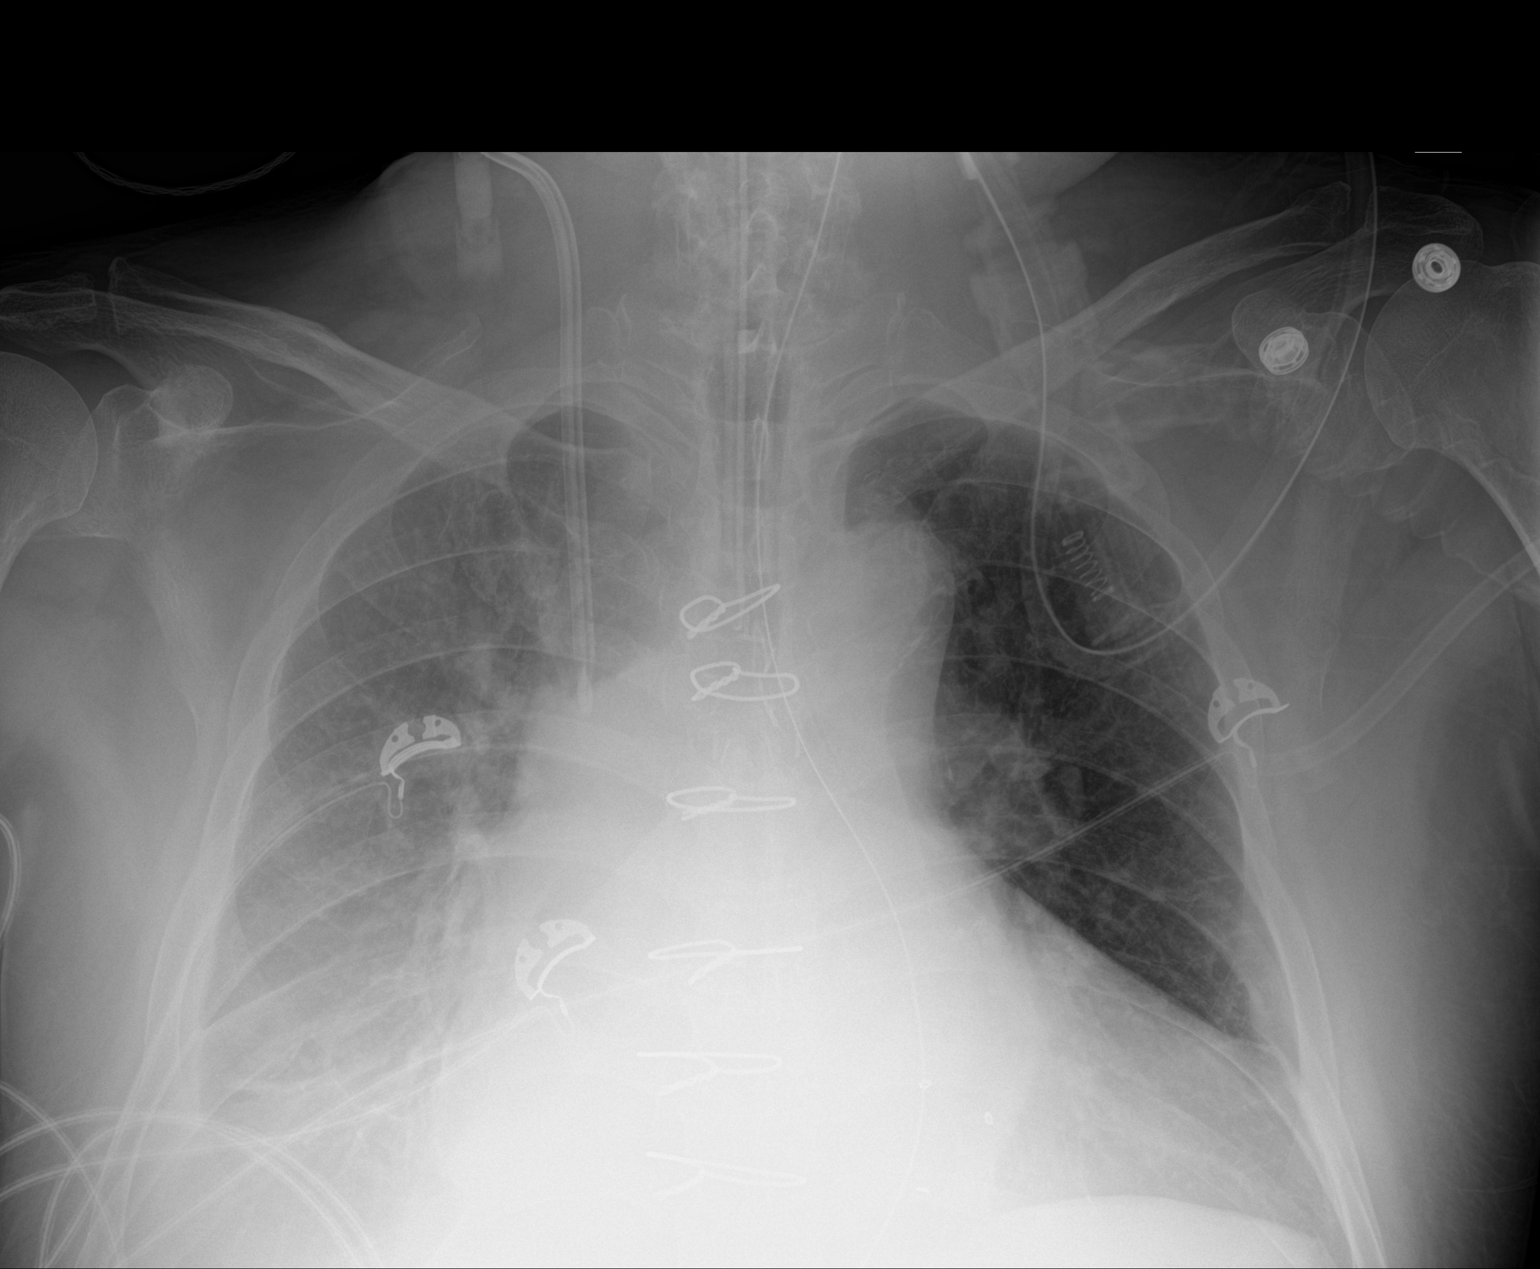

[1 of 1 positions shown; findings below may reference images not displayed]

FINDINGS: New right internal jugular central venous line has its tip
projecting in the mid superior vena cava.

No pneumothorax.

Endotracheal tube and nasal/orogastric tube are stable.

Right pleural effusion with associated basilar atelectasis or
infiltrate is stable. Stable changes from prior cardiac surgery.
IMPRESSION: 1. New right internal jugular dual-lumen central venous catheter.
Tip projects in the mid superior vena cava.
2. No pneumothorax.  No other change from the earlier study.

## 2019-09-29 IMAGING — DX DG CHEST 1V PORT
1 series · 1 of 1 positions shown · non-contrast
Comparison: Two-view chest x-ray 08/06/2018 at [DATE] p.m..

CLINICAL DATA: Encounter for endotracheal tube placement.

EXAM:
PORTABLE CHEST 1 VIEW

[chest]
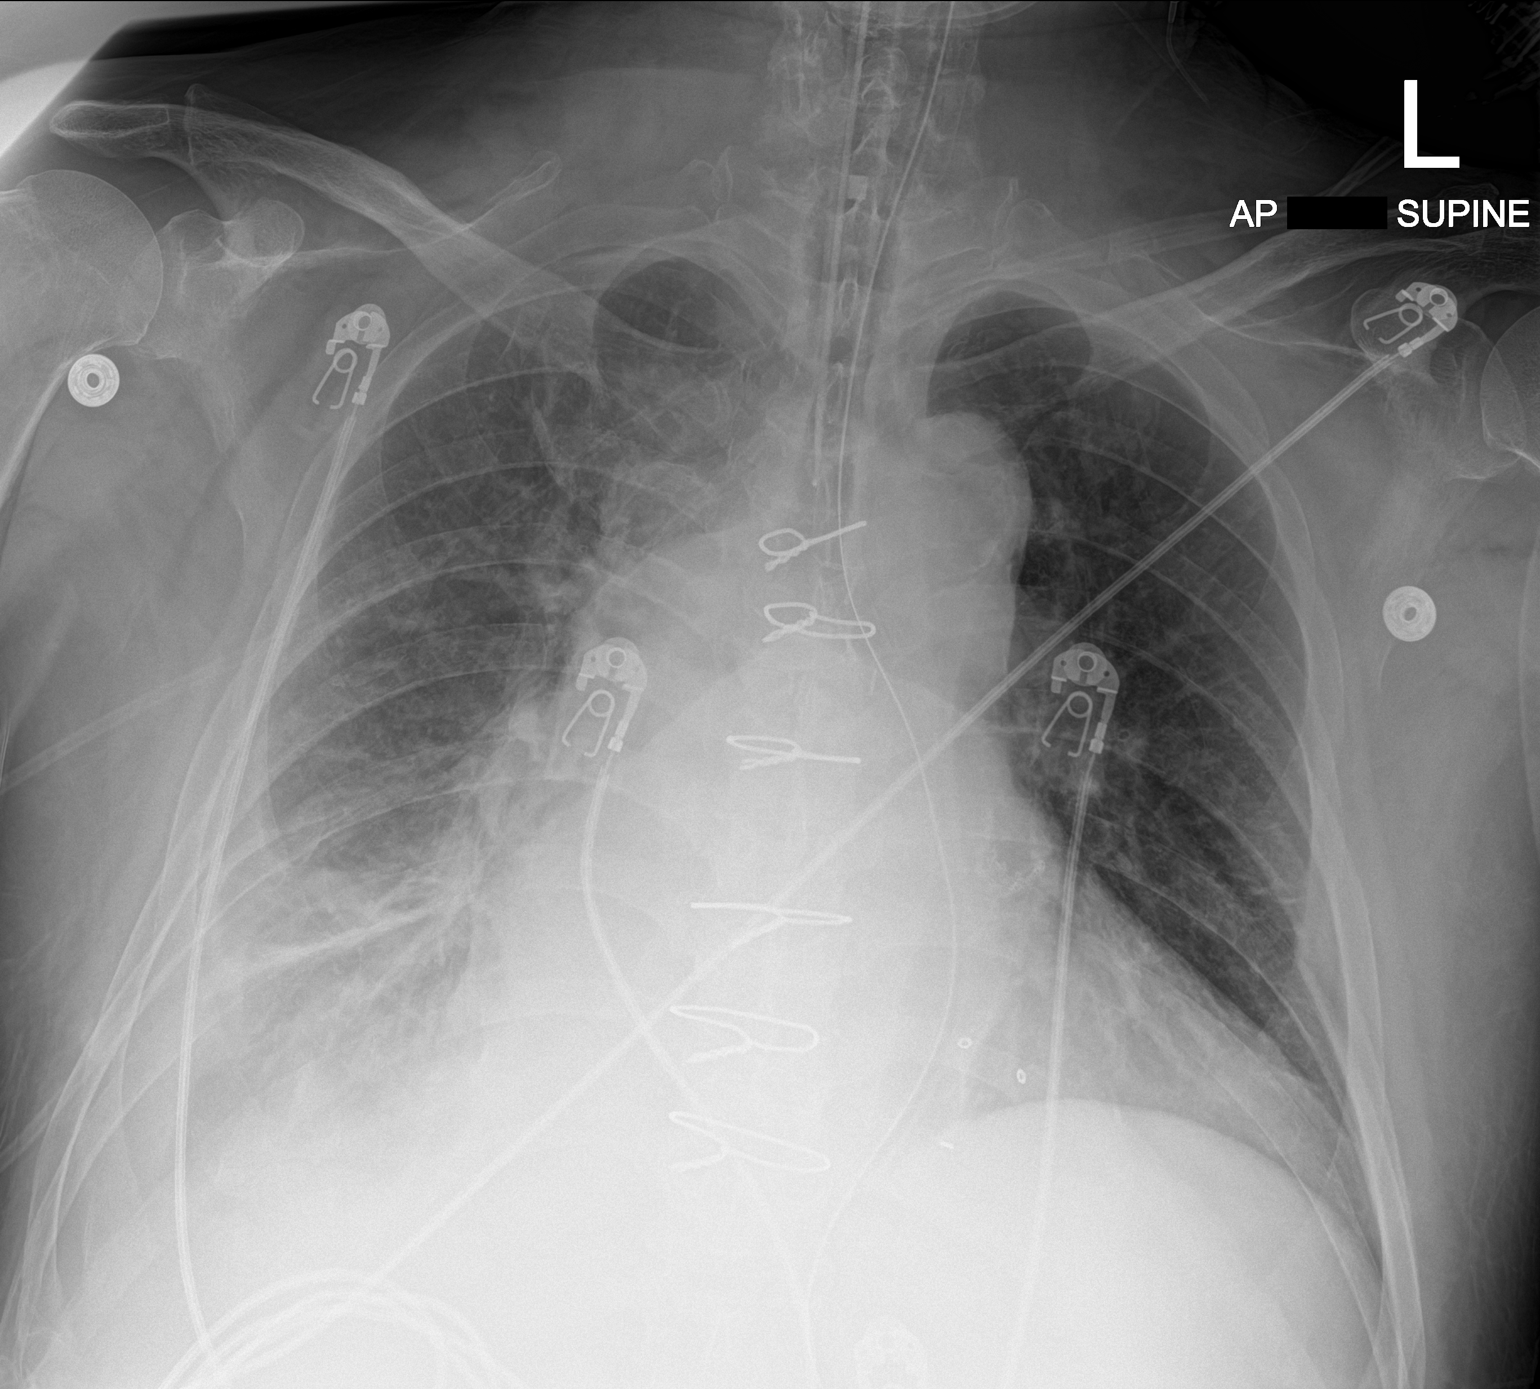

[1 of 1 positions shown; findings below may reference images not displayed]

FINDINGS: The patient is now intubated. Endotracheal tube terminates 3.5 cm
above the carina. An NG tube has been placed. It courses off the
inferior border of the film.

The heart is enlarged. Atherosclerotic calcifications are present at
the aortic arch.

Right lower lobe airspace disease and effusion are again noted. The
left lung is clear.
IMPRESSION: 1. Satisfactory positioning endotracheal tube.
2. NG tube courses off the inferior border of the film.
3. Persistent right lower lobe airspace disease concerning for
pneumonia.
4. Right pleural effusion.

## 2019-09-30 IMAGING — DX DG CHEST 1V PORT
1 series · 1 of 1 positions shown · non-contrast
Comparison: Chest x-rays dated 08/06/2018 and 12/13/2017

CLINICAL DATA: Respiratory failure.

EXAM:
PORTABLE CHEST 1 VIEW

[chest]
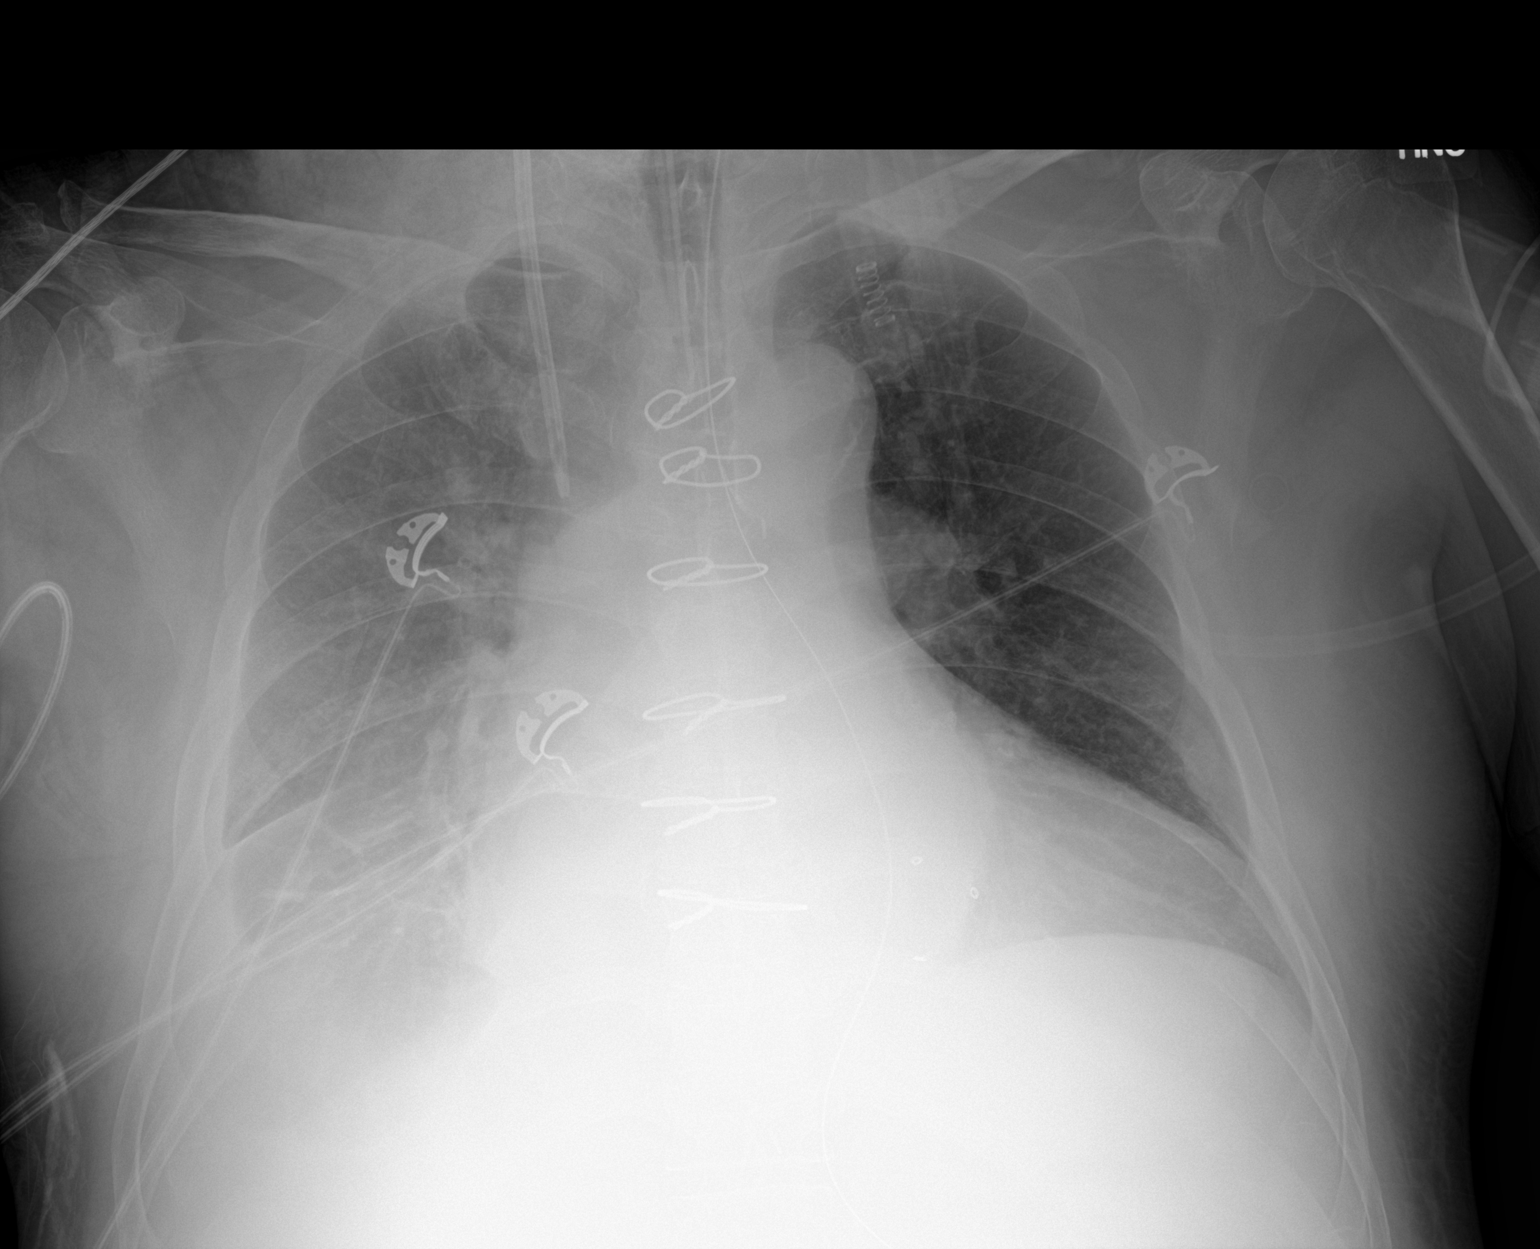

[1 of 1 positions shown; findings below may reference images not displayed]

FINDINGS: Endotracheal tube tip is 5 cm above the carina. Central venous
catheter tip is in the superior vena cava region just above the
azygos vein. NG tube tip is below the diaphragm.

There is cardiomegaly with tortuosity and calcification of the
thoracic aorta. Left lung is clear.

Persistent haziness at the right lung base felt represent a
combination of pleural effusion and scarring at that base as
demonstrated on multiple prior exams.

No significant change since the prior study.
IMPRESSION: No significant change since the prior study. Persistent right
effusion superimposed on scarring at the right base.

## 2020-03-28 IMAGING — CT CT HEAD WITHOUT CONTRAST
5 of 11 series · 17 of 47 positions shown, 19 images · non-contrast
Comparison: Head CT August 10, 2017

CLINICAL DATA: Cervical spine trauma.

EXAM:
CT HEAD WITHOUT CONTRAST
CT CERVICAL SPINE WITHOUT CONTRAST
TECHNIQUE: Multidetector CT imaging of the head and cervical spine was
performed following the standard protocol without intravenous
contrast. Multiplanar CT image reconstructions of the cervical spine
were also generated.

[Series 9: head 3.0 mpr cor · coronal · 0.32mm/px · 3 of 59 slices shown]
[im 17/59  brain]
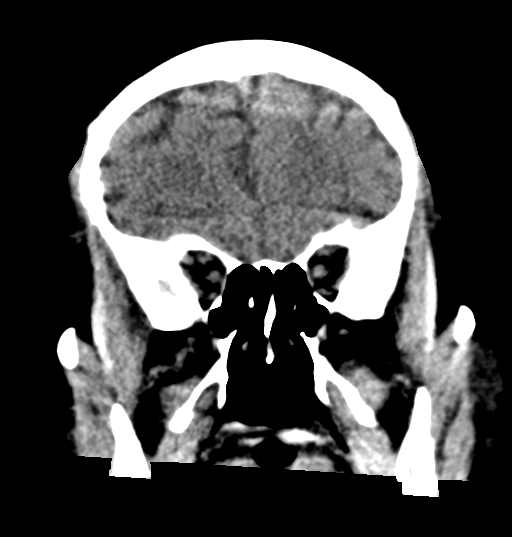
[im 25/59  brain]
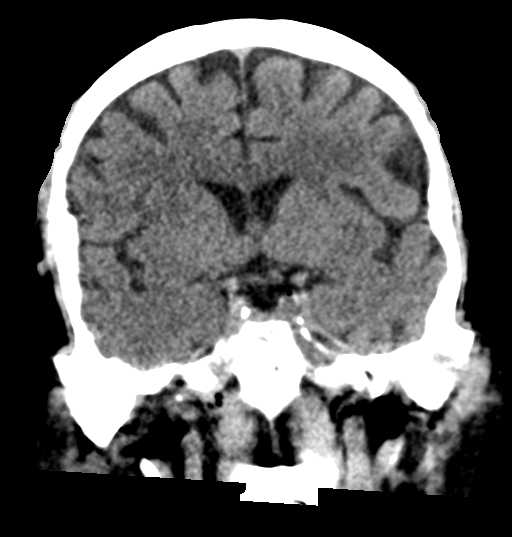
[im 34/59  brain]
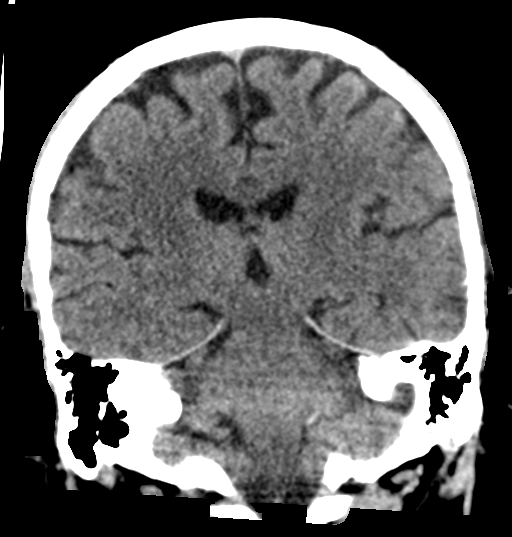

[Series 10: head 3.0 mpr sag · sagittal · 0.33mm/px · 1 of 54 slices shown]
[im 27/54  brain]
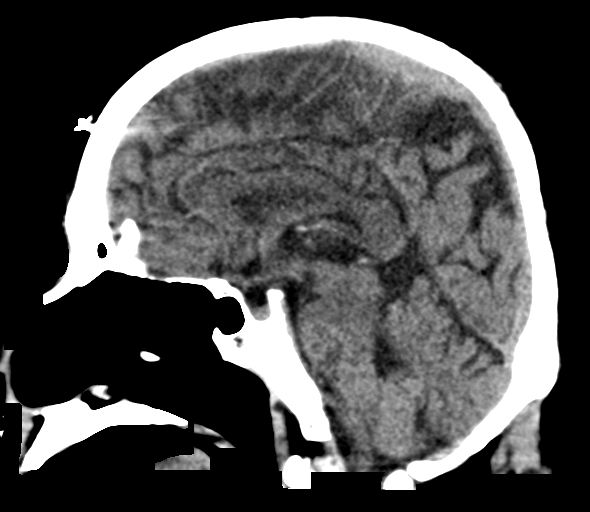

[Series 14: orthogonal axial st · axial · 0.21mm/px · z∈[-284,-166]mm · 5 of 92 slices shown, 7 images (1 of 2)]
[im 16/92  brain]
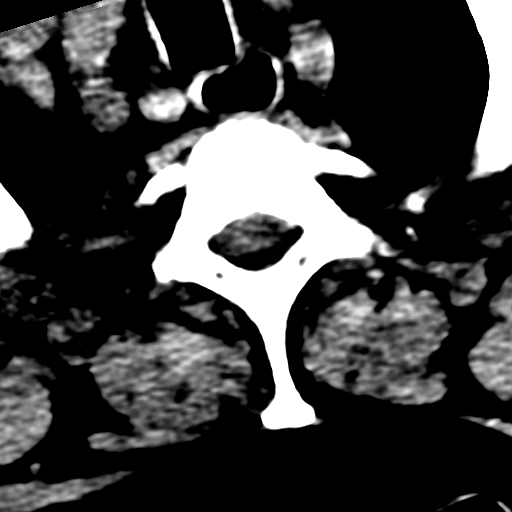
[im 16/92  bone]
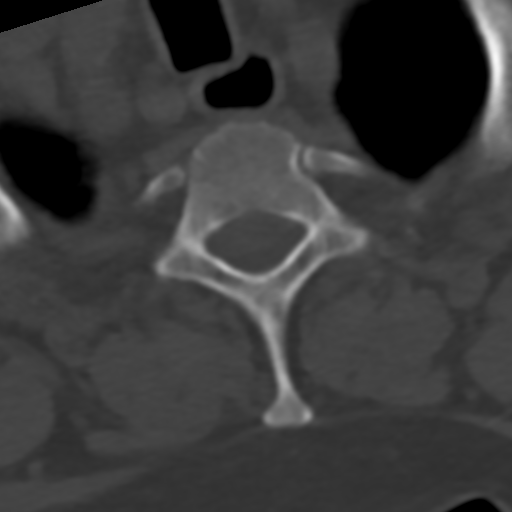
[im 31/92  brain]
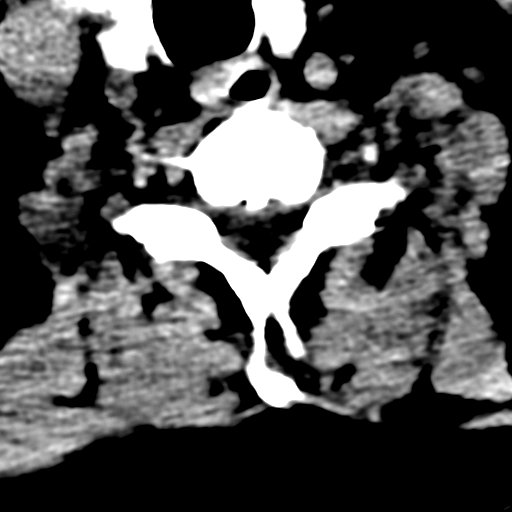
[im 46/92  brain]
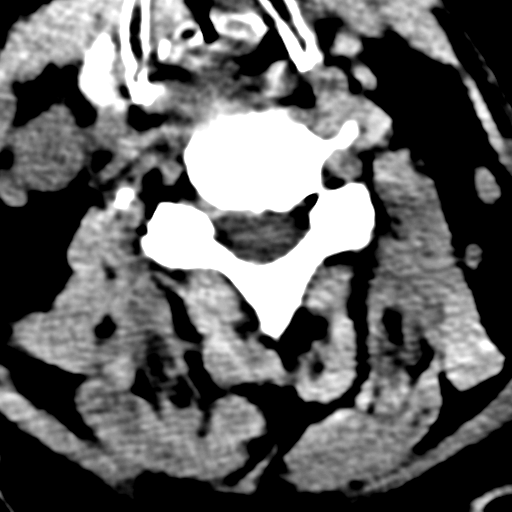
[im 61/92  brain]
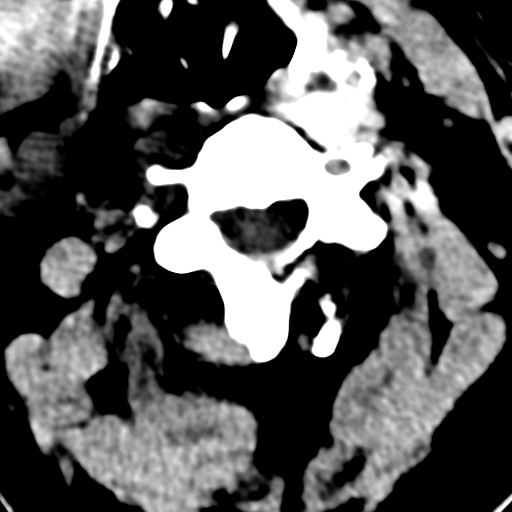
[im 76/92  brain]
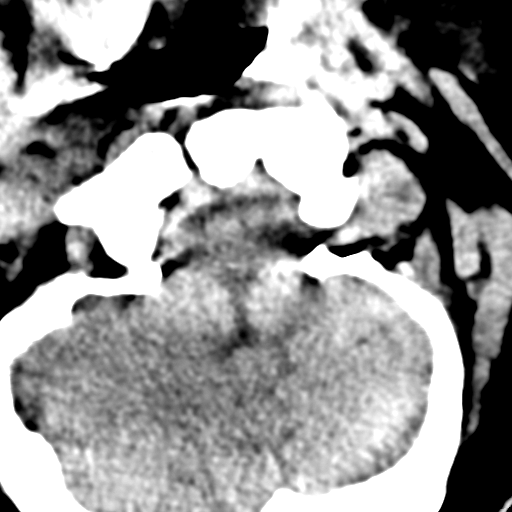
[im 76/92  bone]
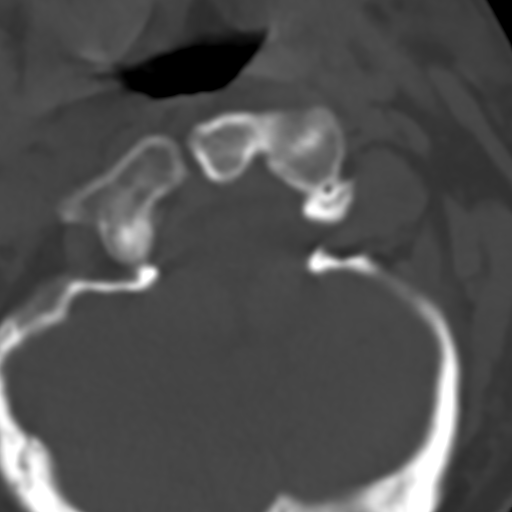

[Series 18: c_spine 2.0 st · axial · 0.35mm/px · z∈[-252,-196]mm · 3 of 83 slices shown]
[im 14/83  brain]
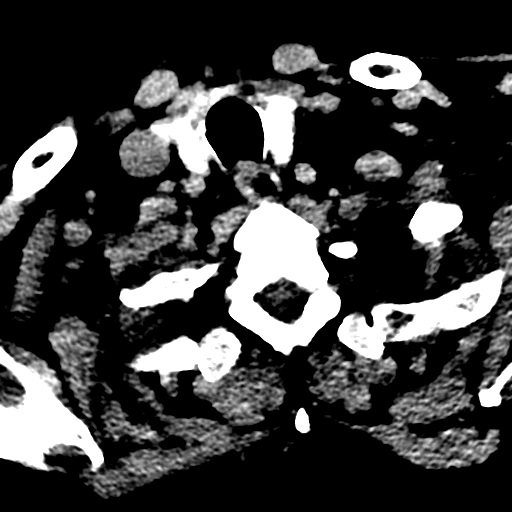
[im 28/83  brain]
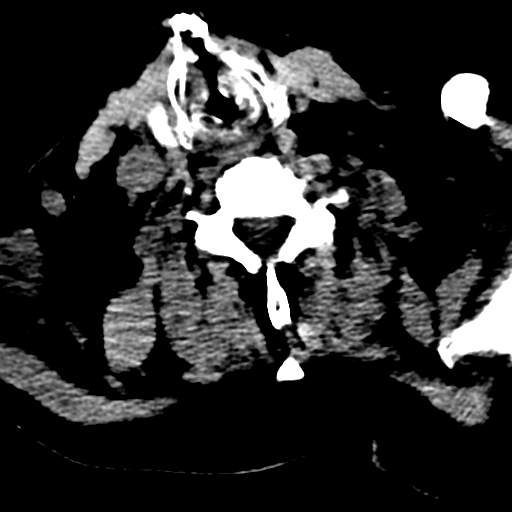
[im 42/83  brain]
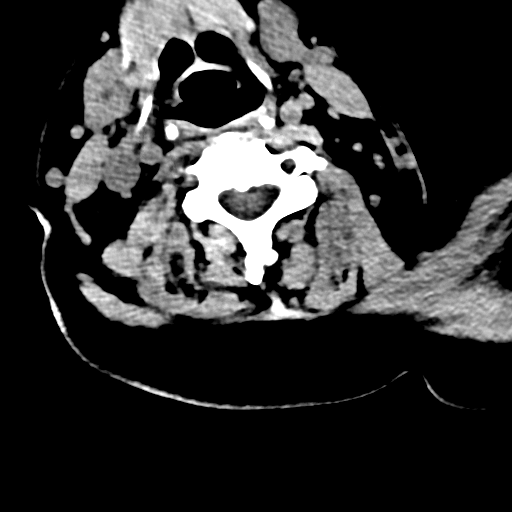

[Series 22: orthogonal axial st · axial · 0.21mm/px · z∈[-279,-172]mm · 5 of 90 slices shown (2 of 2)]
[im 15/90  brain]
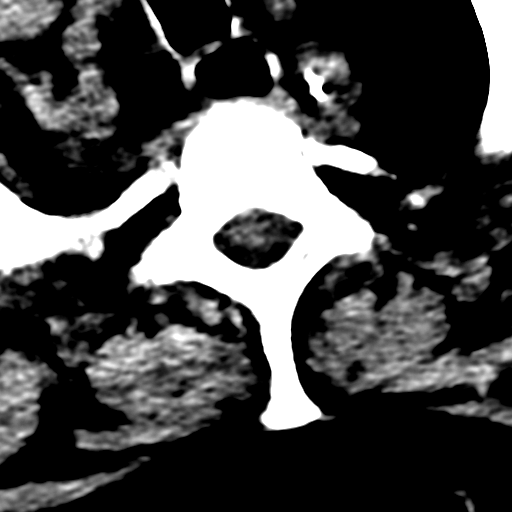
[im 30/90  brain]
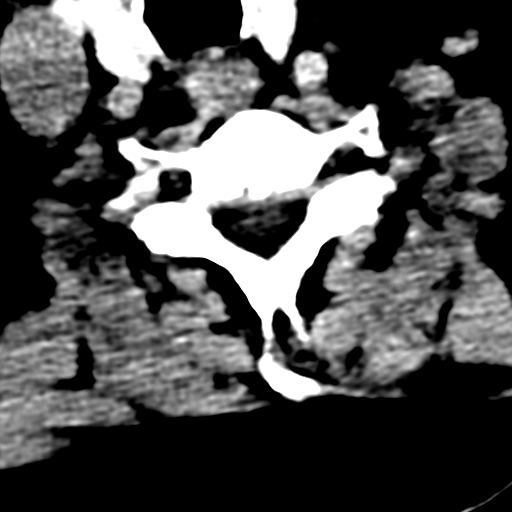
[im 45/90  brain]
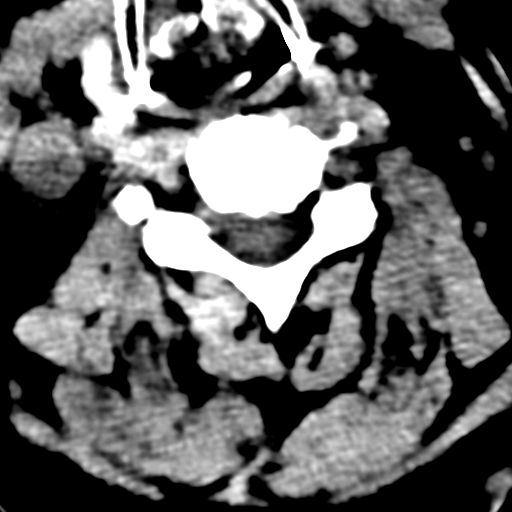
[im 60/90  brain]
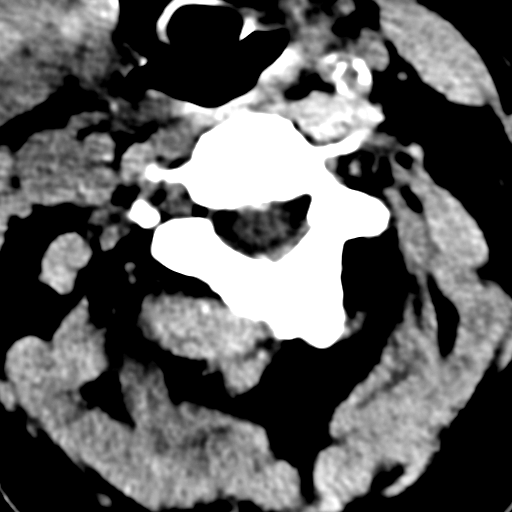
[im 75/90  brain]
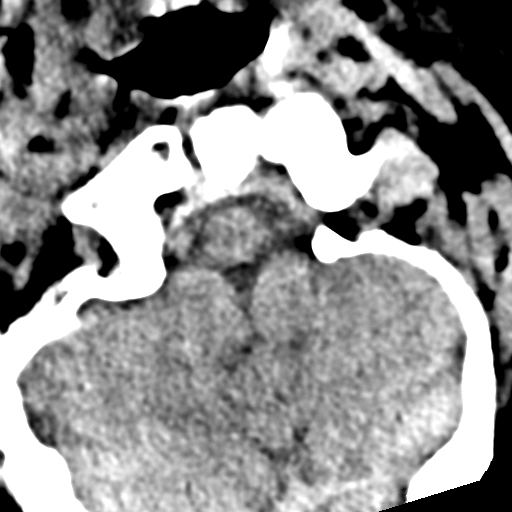

[17 of 47 positions shown; findings below may reference images not displayed]

FINDINGS: CT HEAD FINDINGS

Brain: No evidence of acute infarction, hemorrhage, hydrocephalus,
extra-axial collection or mass lesion/mass effect. There is chronic
diffuse atrophy.

Vascular: No hyperdense vessel or unexpected calcification.

Skull: Normal. Negative for fracture or focal lesion.

Sinuses/Orbits: No acute finding.

Other: None.

CT CERVICAL SPINE FINDINGS

Alignment: Normal.

Skull base and vertebrae: No acute fracture. No primary bone lesion
or focal pathologic process.

Soft tissues and spinal canal: No prevertebral fluid or swelling. No
visible canal hematoma.

Disc levels: Mild degenerative joint changes with osteophytosis are
identified in the mid to lower cervical spine.

Upper chest: Negative.

Other: None.
IMPRESSION: No focal acute intracranial abnormality identified.

No acute fracture or dislocation of cervical spine.
